# Patient Record
Sex: Male | Born: 1940 | Race: White | Hispanic: No | Marital: Married | Smoking: Former smoker
Health system: Southern US, Community
[De-identification: ages and names within clinical notes are randomized; demographics above are authoritative.]

## PROBLEM LIST (undated history)

## (undated) DIAGNOSIS — G4733 Obstructive sleep apnea (adult) (pediatric): Secondary | ICD-10-CM

## (undated) DIAGNOSIS — L570 Actinic keratosis: Secondary | ICD-10-CM

## (undated) DIAGNOSIS — I447 Left bundle-branch block, unspecified: Secondary | ICD-10-CM

## (undated) DIAGNOSIS — M720 Palmar fascial fibromatosis [Dupuytren]: Secondary | ICD-10-CM

## (undated) DIAGNOSIS — T7840XA Allergy, unspecified, initial encounter: Secondary | ICD-10-CM

## (undated) DIAGNOSIS — N183 Chronic kidney disease, stage 3 unspecified: Secondary | ICD-10-CM

## (undated) DIAGNOSIS — R972 Elevated prostate specific antigen [PSA]: Secondary | ICD-10-CM

## (undated) DIAGNOSIS — E119 Type 2 diabetes mellitus without complications: Secondary | ICD-10-CM

## (undated) DIAGNOSIS — Z8719 Personal history of other diseases of the digestive system: Secondary | ICD-10-CM

## (undated) DIAGNOSIS — R011 Cardiac murmur, unspecified: Secondary | ICD-10-CM

## (undated) DIAGNOSIS — R0902 Hypoxemia: Secondary | ICD-10-CM

## (undated) DIAGNOSIS — I272 Pulmonary hypertension, unspecified: Secondary | ICD-10-CM

## (undated) DIAGNOSIS — M1990 Unspecified osteoarthritis, unspecified site: Secondary | ICD-10-CM

## (undated) DIAGNOSIS — E785 Hyperlipidemia, unspecified: Secondary | ICD-10-CM

## (undated) DIAGNOSIS — I509 Heart failure, unspecified: Secondary | ICD-10-CM

## (undated) DIAGNOSIS — R7303 Prediabetes: Secondary | ICD-10-CM

## (undated) DIAGNOSIS — S61412A Laceration without foreign body of left hand, initial encounter: Secondary | ICD-10-CM

## (undated) DIAGNOSIS — G4730 Sleep apnea, unspecified: Secondary | ICD-10-CM

## (undated) DIAGNOSIS — F419 Anxiety disorder, unspecified: Secondary | ICD-10-CM

## (undated) DIAGNOSIS — I639 Cerebral infarction, unspecified: Secondary | ICD-10-CM

## (undated) DIAGNOSIS — I10 Essential (primary) hypertension: Secondary | ICD-10-CM

## (undated) DIAGNOSIS — J189 Pneumonia, unspecified organism: Secondary | ICD-10-CM

## (undated) DIAGNOSIS — K5790 Diverticulosis of intestine, part unspecified, without perforation or abscess without bleeding: Secondary | ICD-10-CM

## (undated) DIAGNOSIS — I209 Angina pectoris, unspecified: Secondary | ICD-10-CM

## (undated) DIAGNOSIS — I5030 Unspecified diastolic (congestive) heart failure: Secondary | ICD-10-CM

## (undated) DIAGNOSIS — J45909 Unspecified asthma, uncomplicated: Secondary | ICD-10-CM

## (undated) DIAGNOSIS — I714 Abdominal aortic aneurysm, without rupture, unspecified: Secondary | ICD-10-CM

## (undated) DIAGNOSIS — R519 Headache, unspecified: Secondary | ICD-10-CM

## (undated) DIAGNOSIS — K4090 Unilateral inguinal hernia, without obstruction or gangrene, not specified as recurrent: Secondary | ICD-10-CM

## (undated) DIAGNOSIS — I5020 Unspecified systolic (congestive) heart failure: Secondary | ICD-10-CM

## (undated) DIAGNOSIS — U071 COVID-19: Secondary | ICD-10-CM

## (undated) DIAGNOSIS — I77811 Abdominal aortic ectasia: Secondary | ICD-10-CM

## (undated) DIAGNOSIS — T884XXA Failed or difficult intubation, initial encounter: Secondary | ICD-10-CM

## (undated) DIAGNOSIS — N411 Chronic prostatitis: Secondary | ICD-10-CM

## (undated) DIAGNOSIS — H3412 Central retinal artery occlusion, left eye: Secondary | ICD-10-CM

## (undated) DIAGNOSIS — M5030 Other cervical disc degeneration, unspecified cervical region: Secondary | ICD-10-CM

## (undated) DIAGNOSIS — K219 Gastro-esophageal reflux disease without esophagitis: Secondary | ICD-10-CM

## (undated) DIAGNOSIS — R51 Headache: Secondary | ICD-10-CM

## (undated) DIAGNOSIS — M5116 Intervertebral disc disorders with radiculopathy, lumbar region: Secondary | ICD-10-CM

## (undated) DIAGNOSIS — I25810 Atherosclerosis of coronary artery bypass graft(s) without angina pectoris: Secondary | ICD-10-CM

## (undated) DIAGNOSIS — Z7982 Long term (current) use of aspirin: Secondary | ICD-10-CM

## (undated) DIAGNOSIS — R002 Palpitations: Secondary | ICD-10-CM

## (undated) DIAGNOSIS — G43909 Migraine, unspecified, not intractable, without status migrainosus: Secondary | ICD-10-CM

## (undated) DIAGNOSIS — Z8619 Personal history of other infectious and parasitic diseases: Secondary | ICD-10-CM

## (undated) DIAGNOSIS — G5793 Unspecified mononeuropathy of bilateral lower limbs: Secondary | ICD-10-CM

## (undated) DIAGNOSIS — I779 Disorder of arteries and arterioles, unspecified: Secondary | ICD-10-CM

## (undated) DIAGNOSIS — J439 Emphysema, unspecified: Secondary | ICD-10-CM

## (undated) DIAGNOSIS — J181 Lobar pneumonia, unspecified organism: Secondary | ICD-10-CM

## (undated) DIAGNOSIS — M48061 Spinal stenosis, lumbar region without neurogenic claudication: Secondary | ICD-10-CM

## (undated) DIAGNOSIS — I219 Acute myocardial infarction, unspecified: Secondary | ICD-10-CM

## (undated) DIAGNOSIS — I739 Peripheral vascular disease, unspecified: Secondary | ICD-10-CM

## (undated) DIAGNOSIS — Z86718 Personal history of other venous thrombosis and embolism: Secondary | ICD-10-CM

## (undated) DIAGNOSIS — I350 Nonrheumatic aortic (valve) stenosis: Secondary | ICD-10-CM

## (undated) DIAGNOSIS — N401 Enlarged prostate with lower urinary tract symptoms: Secondary | ICD-10-CM

## (undated) HISTORY — PX: APPENDECTOMY: SHX54

## (undated) HISTORY — DX: Unilateral inguinal hernia, without obstruction or gangrene, not specified as recurrent: K40.90

## (undated) HISTORY — DX: Actinic keratosis: L57.0

## (undated) HISTORY — DX: Disorder of arteries and arterioles, unspecified: I77.9

## (undated) HISTORY — PX: TOTAL HIP ARTHROPLASTY: SHX124

## (undated) HISTORY — DX: Personal history of other infectious and parasitic diseases: Z86.19

## (undated) HISTORY — DX: Sleep apnea, unspecified: G47.30

## (undated) HISTORY — DX: Hypoxemia: R09.02

## (undated) HISTORY — DX: Emphysema, unspecified: J43.9

## (undated) HISTORY — DX: Spinal stenosis, lumbar region without neurogenic claudication: M48.061

## (undated) HISTORY — DX: Laceration without foreign body of left hand, initial encounter: S61.412A

## (undated) HISTORY — DX: Unspecified osteoarthritis, unspecified site: M19.90

## (undated) HISTORY — DX: Chronic kidney disease, stage 3 unspecified: N18.30

## (undated) HISTORY — DX: Pneumonia, unspecified organism: J18.9

## (undated) HISTORY — DX: Intervertebral disc disorders with radiculopathy, lumbar region: M51.16

## (undated) HISTORY — DX: Peripheral vascular disease, unspecified: I73.9

## (undated) HISTORY — DX: Unspecified asthma, uncomplicated: J45.909

## (undated) HISTORY — DX: Personal history of other venous thrombosis and embolism: Z86.718

## (undated) HISTORY — PX: JOINT REPLACEMENT: SHX530

## (undated) HISTORY — DX: Obstructive sleep apnea (adult) (pediatric): G47.33

## (undated) HISTORY — DX: Chronic prostatitis: N41.1

## (undated) HISTORY — DX: Benign prostatic hyperplasia with lower urinary tract symptoms: N40.1

## (undated) HISTORY — DX: Lobar pneumonia, unspecified organism: J18.1

## (undated) HISTORY — DX: Hyperlipidemia, unspecified: E78.5

## (undated) HISTORY — DX: Allergy, unspecified, initial encounter: T78.40XA

## (undated) HISTORY — PX: EYE SURGERY: SHX253

## (undated) HISTORY — DX: Elevated prostate specific antigen (PSA): R97.20

## (undated) HISTORY — DX: Gastro-esophageal reflux disease without esophagitis: K21.9

## (undated) HISTORY — PX: BACK SURGERY: SHX140

## (undated) HISTORY — DX: Atherosclerosis of coronary artery bypass graft(s) without angina pectoris: I25.810

## (undated) HISTORY — PX: KNEE ARTHROSCOPY: SUR90

## (undated) HISTORY — DX: Essential (primary) hypertension: I10

## (undated) HISTORY — DX: Palmar fascial fibromatosis (dupuytren): M72.0

## (undated) HISTORY — DX: Unspecified diastolic (congestive) heart failure: I50.30

---
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
## 1984-08-15 HISTORY — PX: LAMINOTOMY: SHX998

## 1987-08-16 DIAGNOSIS — I219 Acute myocardial infarction, unspecified: Secondary | ICD-10-CM

## 1987-08-16 HISTORY — DX: Acute myocardial infarction, unspecified: I21.9

## 1988-08-15 DIAGNOSIS — Z951 Presence of aortocoronary bypass graft: Secondary | ICD-10-CM

## 1988-08-15 HISTORY — PX: CORONARY ARTERY BYPASS GRAFT: SHX141

## 1988-08-15 HISTORY — DX: Presence of aortocoronary bypass graft: Z95.1

## 1996-08-15 DIAGNOSIS — I82409 Acute embolism and thrombosis of unspecified deep veins of unspecified lower extremity: Secondary | ICD-10-CM

## 1996-08-15 DIAGNOSIS — Z86718 Personal history of other venous thrombosis and embolism: Secondary | ICD-10-CM

## 1996-08-15 HISTORY — DX: Personal history of other venous thrombosis and embolism: Z86.718

## 1996-08-15 HISTORY — DX: Acute embolism and thrombosis of unspecified deep veins of unspecified lower extremity: I82.409

## 1998-06-25 ENCOUNTER — Encounter: Payer: Self-pay | Admitting: *Deleted

## 1998-06-30 ENCOUNTER — Encounter: Payer: Self-pay | Admitting: *Deleted

## 1998-06-30 ENCOUNTER — Inpatient Hospital Stay (HOSPITAL_COMMUNITY): Admission: RE | Admit: 1998-06-30 | Discharge: 1998-07-05 | Payer: Self-pay | Admitting: *Deleted

## 1998-07-08 ENCOUNTER — Encounter (HOSPITAL_COMMUNITY): Admission: RE | Admit: 1998-07-08 | Discharge: 1998-10-06 | Payer: Self-pay | Admitting: *Deleted

## 2000-04-10 ENCOUNTER — Inpatient Hospital Stay (HOSPITAL_COMMUNITY): Admission: EM | Admit: 2000-04-10 | Discharge: 2000-04-13 | Payer: Self-pay | Admitting: Emergency Medicine

## 2000-04-10 ENCOUNTER — Encounter: Payer: Self-pay | Admitting: General Surgery

## 2000-04-10 ENCOUNTER — Encounter (INDEPENDENT_AMBULATORY_CARE_PROVIDER_SITE_OTHER): Payer: Self-pay | Admitting: Specialist

## 2000-07-27 ENCOUNTER — Ambulatory Visit (HOSPITAL_COMMUNITY): Admission: RE | Admit: 2000-07-27 | Discharge: 2000-07-27 | Payer: Self-pay | Admitting: Cardiology

## 2000-08-09 ENCOUNTER — Encounter: Payer: Self-pay | Admitting: Orthopedic Surgery

## 2000-08-14 ENCOUNTER — Inpatient Hospital Stay (HOSPITAL_COMMUNITY): Admission: RE | Admit: 2000-08-14 | Discharge: 2000-08-19 | Payer: Self-pay | Admitting: Orthopedic Surgery

## 2000-08-14 ENCOUNTER — Encounter: Payer: Self-pay | Admitting: Orthopedic Surgery

## 2000-11-29 ENCOUNTER — Inpatient Hospital Stay (HOSPITAL_COMMUNITY): Admission: EM | Admit: 2000-11-29 | Discharge: 2000-12-01 | Payer: Self-pay | Admitting: Emergency Medicine

## 2000-11-29 ENCOUNTER — Encounter: Payer: Self-pay | Admitting: Emergency Medicine

## 2000-11-30 HISTORY — PX: CORONARY ANGIOPLASTY: SHX604

## 2001-05-04 ENCOUNTER — Encounter: Admission: RE | Admit: 2001-05-04 | Discharge: 2001-05-04 | Payer: Self-pay | Admitting: Internal Medicine

## 2001-05-04 ENCOUNTER — Encounter: Payer: Self-pay | Admitting: Internal Medicine

## 2001-05-23 ENCOUNTER — Ambulatory Visit (HOSPITAL_COMMUNITY): Admission: RE | Admit: 2001-05-23 | Discharge: 2001-05-23 | Payer: Self-pay | Admitting: Internal Medicine

## 2001-05-23 ENCOUNTER — Encounter: Payer: Self-pay | Admitting: Internal Medicine

## 2002-08-19 ENCOUNTER — Encounter: Admission: RE | Admit: 2002-08-19 | Discharge: 2002-08-19 | Payer: Self-pay | Admitting: Internal Medicine

## 2002-08-19 ENCOUNTER — Encounter: Payer: Self-pay | Admitting: Internal Medicine

## 2002-09-04 ENCOUNTER — Ambulatory Visit (HOSPITAL_COMMUNITY): Admission: RE | Admit: 2002-09-04 | Discharge: 2002-09-04 | Payer: Self-pay | Admitting: Orthopedic Surgery

## 2002-09-04 ENCOUNTER — Encounter: Payer: Self-pay | Admitting: Orthopedic Surgery

## 2003-03-23 ENCOUNTER — Encounter: Payer: Self-pay | Admitting: *Deleted

## 2003-03-23 ENCOUNTER — Emergency Department (HOSPITAL_COMMUNITY): Admission: AD | Admit: 2003-03-23 | Discharge: 2003-03-23 | Payer: Self-pay | Admitting: Emergency Medicine

## 2004-02-07 ENCOUNTER — Emergency Department (HOSPITAL_COMMUNITY): Admission: EM | Admit: 2004-02-07 | Discharge: 2004-02-07 | Payer: Self-pay | Admitting: Emergency Medicine

## 2004-06-21 ENCOUNTER — Ambulatory Visit: Payer: Self-pay | Admitting: Internal Medicine

## 2004-06-29 ENCOUNTER — Ambulatory Visit: Payer: Self-pay | Admitting: Internal Medicine

## 2004-07-12 ENCOUNTER — Ambulatory Visit: Payer: Self-pay | Admitting: Internal Medicine

## 2004-07-27 ENCOUNTER — Ambulatory Visit: Payer: Self-pay | Admitting: Cardiology

## 2004-08-03 ENCOUNTER — Ambulatory Visit: Payer: Self-pay | Admitting: Cardiology

## 2004-08-17 ENCOUNTER — Ambulatory Visit: Payer: Self-pay | Admitting: Internal Medicine

## 2004-09-29 ENCOUNTER — Ambulatory Visit: Payer: Self-pay | Admitting: Internal Medicine

## 2004-10-22 ENCOUNTER — Ambulatory Visit: Payer: Self-pay | Admitting: Internal Medicine

## 2004-10-30 ENCOUNTER — Ambulatory Visit: Payer: Self-pay | Admitting: Family Medicine

## 2004-11-02 ENCOUNTER — Encounter: Admission: RE | Admit: 2004-11-02 | Discharge: 2004-11-02 | Payer: Self-pay | Admitting: Internal Medicine

## 2004-12-28 ENCOUNTER — Ambulatory Visit: Payer: Self-pay | Admitting: Internal Medicine

## 2005-04-13 ENCOUNTER — Ambulatory Visit: Payer: Self-pay | Admitting: Internal Medicine

## 2005-04-20 ENCOUNTER — Ambulatory Visit: Payer: Self-pay | Admitting: Internal Medicine

## 2005-05-25 ENCOUNTER — Encounter: Admission: RE | Admit: 2005-05-25 | Discharge: 2005-05-25 | Payer: Self-pay | Admitting: Orthopedic Surgery

## 2005-07-26 ENCOUNTER — Ambulatory Visit: Payer: Self-pay | Admitting: Internal Medicine

## 2005-08-12 ENCOUNTER — Ambulatory Visit: Payer: Self-pay | Admitting: Cardiology

## 2005-08-18 ENCOUNTER — Ambulatory Visit: Payer: Self-pay | Admitting: Cardiology

## 2005-09-17 ENCOUNTER — Ambulatory Visit: Payer: Self-pay | Admitting: Internal Medicine

## 2005-09-29 ENCOUNTER — Ambulatory Visit: Payer: Self-pay | Admitting: Internal Medicine

## 2005-12-07 ENCOUNTER — Ambulatory Visit: Payer: Self-pay | Admitting: Internal Medicine

## 2005-12-12 ENCOUNTER — Encounter: Admission: RE | Admit: 2005-12-12 | Discharge: 2005-12-12 | Payer: Self-pay | Admitting: General Surgery

## 2006-01-25 ENCOUNTER — Ambulatory Visit: Payer: Self-pay | Admitting: Internal Medicine

## 2006-04-19 ENCOUNTER — Ambulatory Visit: Payer: Self-pay | Admitting: Internal Medicine

## 2006-04-26 ENCOUNTER — Ambulatory Visit: Payer: Self-pay | Admitting: Internal Medicine

## 2006-06-29 ENCOUNTER — Ambulatory Visit: Payer: Self-pay | Admitting: Internal Medicine

## 2006-07-07 ENCOUNTER — Ambulatory Visit (HOSPITAL_COMMUNITY): Admission: RE | Admit: 2006-07-07 | Discharge: 2006-07-07 | Payer: Self-pay | Admitting: Orthopedic Surgery

## 2006-07-21 ENCOUNTER — Ambulatory Visit: Payer: Self-pay | Admitting: Cardiology

## 2006-09-19 ENCOUNTER — Ambulatory Visit: Payer: Self-pay | Admitting: Internal Medicine

## 2006-09-19 LAB — CONVERTED CEMR LAB
ALT: 28 units/L (ref 0–40)
AST: 28 units/L (ref 0–37)
Albumin: 4.1 g/dL (ref 3.5–5.2)
Alkaline Phosphatase: 40 units/L (ref 39–117)
BUN: 11 mg/dL (ref 6–23)
Basophils Absolute: 0 10*3/uL (ref 0.0–0.1)
Basophils Relative: 1 % (ref 0.0–1.0)
Bilirubin, Direct: 0.2 mg/dL (ref 0.0–0.3)
CO2: 25 meq/L (ref 19–32)
Calcium: 9.4 mg/dL (ref 8.4–10.5)
Chloride: 106 meq/L (ref 96–112)
Cholesterol: 132 mg/dL (ref 0–200)
Creatinine, Ser: 1.1 mg/dL (ref 0.4–1.5)
Direct LDL: 77.6 mg/dL
Eosinophils Absolute: 0.2 10*3/uL (ref 0.0–0.6)
Eosinophils Relative: 4.3 % (ref 0.0–5.0)
GFR calc Af Amer: 86 mL/min
GFR calc non Af Amer: 71 mL/min
Glucose, Bld: 110 mg/dL — ABNORMAL HIGH (ref 70–99)
HCT: 43.4 % (ref 39.0–52.0)
HDL: 28.3 mg/dL — ABNORMAL LOW (ref >39.0)
Hemoglobin: 15.5 g/dL (ref 13.0–17.0)
Hgb A1c MFr Bld: 6 % (ref 4.6–6.0)
Lymphocytes Relative: 27.9 % (ref 12.0–46.0)
MCHC: 35.6 g/dL (ref 30.0–36.0)
MCV: 95.3 fL (ref 78.0–100.0)
Monocytes Absolute: 0.5 10*3/uL (ref 0.2–0.7)
Monocytes Relative: 11.2 % — ABNORMAL HIGH (ref 3.0–11.0)
Neutro Abs: 2.7 10*3/uL (ref 1.4–7.7)
Neutrophils Relative %: 55.6 % (ref 43.0–77.0)
PSA: 0.76 ng/mL (ref 0.10–4.00)
Platelets: 207 10*3/uL (ref 150–400)
Potassium: 4 meq/L (ref 3.5–5.1)
RBC: 4.56 M/uL (ref 4.22–5.81)
RDW: 11.3 % — ABNORMAL LOW (ref 11.5–14.6)
Sodium: 140 meq/L (ref 135–145)
TSH: 3.46 microintl units/mL (ref 0.35–5.50)
Total Bilirubin: 0.7 mg/dL (ref 0.3–1.2)
Total CHOL/HDL Ratio: 4.7
Total Protein: 6.6 g/dL (ref 6.0–8.3)
Triglycerides: 208 mg/dL (ref 0–149)
VLDL: 42 mg/dL — ABNORMAL HIGH (ref 0–40)
WBC: 4.7 10*3/uL (ref 4.5–10.5)

## 2006-10-04 ENCOUNTER — Ambulatory Visit: Payer: Self-pay | Admitting: Internal Medicine

## 2006-10-10 ENCOUNTER — Ambulatory Visit: Payer: Self-pay

## 2006-12-06 ENCOUNTER — Ambulatory Visit: Payer: Self-pay | Admitting: Internal Medicine

## 2007-03-02 ENCOUNTER — Ambulatory Visit: Payer: Self-pay | Admitting: Internal Medicine

## 2007-04-26 DIAGNOSIS — Z86718 Personal history of other venous thrombosis and embolism: Secondary | ICD-10-CM | POA: Insufficient documentation

## 2007-04-26 DIAGNOSIS — M179 Osteoarthritis of knee, unspecified: Secondary | ICD-10-CM | POA: Insufficient documentation

## 2007-04-26 DIAGNOSIS — M1710 Unilateral primary osteoarthritis, unspecified knee: Secondary | ICD-10-CM | POA: Insufficient documentation

## 2007-04-26 DIAGNOSIS — E1169 Type 2 diabetes mellitus with other specified complication: Secondary | ICD-10-CM | POA: Insufficient documentation

## 2007-04-26 DIAGNOSIS — E785 Hyperlipidemia, unspecified: Secondary | ICD-10-CM

## 2007-04-26 HISTORY — DX: Hyperlipidemia, unspecified: E78.5

## 2007-04-30 ENCOUNTER — Ambulatory Visit: Payer: Self-pay | Admitting: Internal Medicine

## 2007-04-30 DIAGNOSIS — I10 Essential (primary) hypertension: Secondary | ICD-10-CM | POA: Insufficient documentation

## 2007-04-30 DIAGNOSIS — K219 Gastro-esophageal reflux disease without esophagitis: Secondary | ICD-10-CM | POA: Insufficient documentation

## 2007-04-30 HISTORY — DX: Essential (primary) hypertension: I10

## 2007-04-30 HISTORY — DX: Gastro-esophageal reflux disease without esophagitis: K21.9

## 2007-04-30 LAB — CONVERTED CEMR LAB
ALT: 27 units/L (ref 0–53)
AST: 21 units/L (ref 0–37)
Albumin: 4 g/dL (ref 3.5–5.2)
Alkaline Phosphatase: 44 units/L (ref 39–117)
Bilirubin, Direct: 0.2 mg/dL (ref 0.0–0.3)
Cholesterol, target level: 200 mg/dL
Cholesterol: 129 mg/dL (ref 0–200)
HDL goal, serum: 40 mg/dL
HDL: 26.3 mg/dL — ABNORMAL LOW (ref >39.0)
LDL Cholesterol: 72 mg/dL (ref 0–99)
LDL Goal: 100 mg/dL
Total Bilirubin: 0.9 mg/dL (ref 0.3–1.2)
Total CHOL/HDL Ratio: 4.9
Total Protein: 6.6 g/dL (ref 6.0–8.3)
Triglycerides: 155 mg/dL — ABNORMAL HIGH (ref 0–149)
VLDL: 31 mg/dL (ref 0–40)

## 2007-07-04 ENCOUNTER — Ambulatory Visit: Payer: Self-pay | Admitting: Internal Medicine

## 2007-07-25 ENCOUNTER — Ambulatory Visit: Payer: Self-pay | Admitting: Cardiology

## 2007-08-01 ENCOUNTER — Ambulatory Visit: Payer: Self-pay | Admitting: Cardiology

## 2007-08-01 ENCOUNTER — Ambulatory Visit: Payer: Self-pay

## 2007-08-01 LAB — CONVERTED CEMR LAB
BUN: 13 mg/dL (ref 6–23)
Basophils Absolute: 0.1 10*3/uL (ref 0.0–0.1)
Basophils Relative: 0.9 % (ref 0.0–1.0)
CO2: 29 meq/L (ref 19–32)
Calcium: 9.3 mg/dL (ref 8.4–10.5)
Chloride: 105 meq/L (ref 96–112)
Creatinine, Ser: 1 mg/dL (ref 0.4–1.5)
Eosinophils Absolute: 0.2 10*3/uL (ref 0.0–0.6)
Eosinophils Relative: 3.7 % (ref 0.0–5.0)
GFR calc Af Amer: 96 mL/min
GFR calc non Af Amer: 79 mL/min
Glucose, Bld: 127 mg/dL — ABNORMAL HIGH (ref 70–99)
HCT: 43.3 % (ref 39.0–52.0)
Hemoglobin: 15.3 g/dL (ref 13.0–17.0)
INR: 0.9 (ref 0.8–1.0)
Lymphocytes Relative: 24.2 % (ref 12.0–46.0)
MCHC: 35.3 g/dL (ref 30.0–36.0)
MCV: 95.4 fL (ref 78.0–100.0)
Monocytes Absolute: 0.6 10*3/uL (ref 0.2–0.7)
Monocytes Relative: 10 % (ref 3.0–11.0)
Neutro Abs: 3.5 10*3/uL (ref 1.4–7.7)
Neutrophils Relative %: 61.2 % (ref 43.0–77.0)
Platelets: 206 10*3/uL (ref 150–400)
Potassium: 3.8 meq/L (ref 3.5–5.1)
Prothrombin Time: 11.7 s (ref 10.9–13.3)
RBC: 4.54 M/uL (ref 4.22–5.81)
RDW: 11.7 % (ref 11.5–14.6)
Sodium: 140 meq/L (ref 135–145)
WBC: 5.8 10*3/uL (ref 4.5–10.5)
aPTT: 29.1 s (ref 21.7–29.8)

## 2007-08-02 ENCOUNTER — Inpatient Hospital Stay (HOSPITAL_BASED_OUTPATIENT_CLINIC_OR_DEPARTMENT_OTHER): Admission: RE | Admit: 2007-08-02 | Discharge: 2007-08-02 | Payer: Self-pay | Admitting: Cardiology

## 2007-08-02 ENCOUNTER — Ambulatory Visit: Payer: Self-pay | Admitting: Cardiology

## 2007-08-16 HISTORY — PX: CATARACT EXTRACTION, BILATERAL: SHX1313

## 2007-08-17 ENCOUNTER — Ambulatory Visit: Payer: Self-pay

## 2007-08-27 ENCOUNTER — Ambulatory Visit: Payer: Self-pay | Admitting: Cardiology

## 2007-09-06 ENCOUNTER — Ambulatory Visit: Payer: Self-pay | Admitting: Internal Medicine

## 2007-09-06 DIAGNOSIS — N138 Other obstructive and reflux uropathy: Secondary | ICD-10-CM | POA: Insufficient documentation

## 2007-09-06 DIAGNOSIS — N401 Enlarged prostate with lower urinary tract symptoms: Secondary | ICD-10-CM

## 2007-09-06 DIAGNOSIS — N400 Enlarged prostate without lower urinary tract symptoms: Secondary | ICD-10-CM

## 2007-09-06 HISTORY — DX: Other obstructive and reflux uropathy: N13.8

## 2007-09-06 HISTORY — DX: Benign prostatic hyperplasia without lower urinary tract symptoms: N40.0

## 2007-11-06 ENCOUNTER — Ambulatory Visit: Payer: Self-pay | Admitting: Ophthalmology

## 2007-11-08 ENCOUNTER — Ambulatory Visit: Payer: Self-pay | Admitting: Internal Medicine

## 2007-12-04 ENCOUNTER — Telehealth: Payer: Self-pay | Admitting: Internal Medicine

## 2007-12-18 ENCOUNTER — Ambulatory Visit: Payer: Self-pay | Admitting: Ophthalmology

## 2008-01-23 ENCOUNTER — Ambulatory Visit (HOSPITAL_BASED_OUTPATIENT_CLINIC_OR_DEPARTMENT_OTHER): Admission: RE | Admit: 2008-01-23 | Discharge: 2008-01-23 | Payer: Self-pay | Admitting: Orthopedic Surgery

## 2008-02-01 ENCOUNTER — Ambulatory Visit: Payer: Self-pay | Admitting: Internal Medicine

## 2008-02-01 LAB — CONVERTED CEMR LAB
ALT: 32 units/L (ref 0–53)
AST: 31 units/L (ref 0–37)
Albumin: 4 g/dL (ref 3.5–5.2)
Alkaline Phosphatase: 53 units/L (ref 39–117)
Bilirubin, Direct: 0.1 mg/dL (ref 0.0–0.3)
Cholesterol: 121 mg/dL (ref 0–200)
HDL: 24.2 mg/dL — ABNORMAL LOW (ref >39.0)
LDL Cholesterol: 61 mg/dL (ref 0–99)
Total Bilirubin: 0.8 mg/dL (ref 0.3–1.2)
Total CHOL/HDL Ratio: 5
Total Protein: 6.8 g/dL (ref 6.0–8.3)
Triglycerides: 180 mg/dL — ABNORMAL HIGH (ref 0–149)
VLDL: 36 mg/dL (ref 0–40)

## 2008-02-08 ENCOUNTER — Ambulatory Visit: Payer: Self-pay | Admitting: Internal Medicine

## 2008-04-29 ENCOUNTER — Ambulatory Visit: Payer: Self-pay | Admitting: Internal Medicine

## 2008-04-29 LAB — CONVERTED CEMR LAB
ALT: 26 units/L (ref 0–53)
AST: 26 units/L (ref 0–37)
Albumin: 4.1 g/dL (ref 3.5–5.2)
Alkaline Phosphatase: 42 units/L (ref 39–117)
Bilirubin, Direct: 0.2 mg/dL (ref 0.0–0.3)
Cholesterol: 147 mg/dL (ref 0–200)
HDL: 26.1 mg/dL — ABNORMAL LOW (ref >39.0)
LDL Cholesterol: 91 mg/dL (ref 0–99)
Total Bilirubin: 1 mg/dL (ref 0.3–1.2)
Total CHOL/HDL Ratio: 5.6
Total Protein: 6.6 g/dL (ref 6.0–8.3)
Triglycerides: 150 mg/dL — ABNORMAL HIGH (ref 0–149)
VLDL: 30 mg/dL (ref 0–40)

## 2008-05-09 ENCOUNTER — Ambulatory Visit: Payer: Self-pay | Admitting: Internal Medicine

## 2008-05-09 DIAGNOSIS — R109 Unspecified abdominal pain: Secondary | ICD-10-CM | POA: Insufficient documentation

## 2008-05-09 DIAGNOSIS — N411 Chronic prostatitis: Secondary | ICD-10-CM | POA: Insufficient documentation

## 2008-05-09 HISTORY — DX: Chronic prostatitis: N41.1

## 2008-05-12 ENCOUNTER — Encounter: Admission: RE | Admit: 2008-05-12 | Discharge: 2008-05-12 | Payer: Self-pay | Admitting: Internal Medicine

## 2008-07-02 ENCOUNTER — Ambulatory Visit: Payer: Self-pay | Admitting: Internal Medicine

## 2008-07-02 LAB — CONVERTED CEMR LAB
ALT: 17 units/L (ref 0–53)
AST: 21 units/L (ref 0–37)
Albumin: 4 g/dL (ref 3.5–5.2)
Alkaline Phosphatase: 44 units/L (ref 39–117)
BUN: 12 mg/dL (ref 6–23)
Basophils Absolute: 0.1 10*3/uL (ref 0.0–0.1)
Basophils Relative: 1 % (ref 0.0–3.0)
Bilirubin Urine: NEGATIVE
Bilirubin, Direct: 0.1 mg/dL (ref 0.0–0.3)
Blood in Urine, dipstick: NEGATIVE
CO2: 26 meq/L (ref 19–32)
Calcium: 9.2 mg/dL (ref 8.4–10.5)
Chloride: 107 meq/L (ref 96–112)
Cholesterol: 122 mg/dL (ref 0–200)
Creatinine, Ser: 1 mg/dL (ref 0.4–1.5)
Eosinophils Absolute: 0.2 10*3/uL (ref 0.0–0.7)
Eosinophils Relative: 3 % (ref 0.0–5.0)
GFR calc Af Amer: 96 mL/min
GFR calc non Af Amer: 79 mL/min
Glucose, Bld: 106 mg/dL — ABNORMAL HIGH (ref 70–99)
Glucose, Urine, Semiquant: NEGATIVE
HCT: 44.3 % (ref 39.0–52.0)
HDL: 34.1 mg/dL — ABNORMAL LOW (ref >39.0)
Hemoglobin: 15.6 g/dL (ref 13.0–17.0)
Ketones, urine, test strip: NEGATIVE
LDL Cholesterol: 67 mg/dL (ref 0–99)
Lymphocytes Relative: 22.7 % (ref 12.0–46.0)
MCHC: 35.1 g/dL (ref 30.0–36.0)
MCV: 96.5 fL (ref 78.0–100.0)
Monocytes Absolute: 0.6 10*3/uL (ref 0.1–1.0)
Monocytes Relative: 9.4 % (ref 3.0–12.0)
Neutro Abs: 3.9 10*3/uL (ref 1.4–7.7)
Neutrophils Relative %: 63.9 % (ref 43.0–77.0)
Nitrite: NEGATIVE
PSA: 6.35 ng/mL — ABNORMAL HIGH (ref 0.10–4.00)
Platelets: 191 10*3/uL (ref 150–400)
Potassium: 3.9 meq/L (ref 3.5–5.1)
Protein, U semiquant: NEGATIVE
RBC: 4.59 M/uL (ref 4.22–5.81)
RDW: 11.4 % — ABNORMAL LOW (ref 11.5–14.6)
Sodium: 140 meq/L (ref 135–145)
Specific Gravity, Urine: 1.015
TSH: 2.49 microintl units/mL (ref 0.35–5.50)
Total Bilirubin: 1.1 mg/dL (ref 0.3–1.2)
Total CHOL/HDL Ratio: 3.6
Total Protein: 7 g/dL (ref 6.0–8.3)
Triglycerides: 107 mg/dL (ref 0–149)
Urobilinogen, UA: 0.2
VLDL: 21 mg/dL (ref 0–40)
WBC: 6.2 10*3/uL (ref 4.5–10.5)
pH: 5.5

## 2008-07-09 ENCOUNTER — Ambulatory Visit: Payer: Self-pay | Admitting: Internal Medicine

## 2008-07-09 DIAGNOSIS — I6529 Occlusion and stenosis of unspecified carotid artery: Secondary | ICD-10-CM | POA: Insufficient documentation

## 2008-07-09 DIAGNOSIS — R972 Elevated prostate specific antigen [PSA]: Secondary | ICD-10-CM | POA: Insufficient documentation

## 2008-07-09 HISTORY — DX: Elevated prostate specific antigen (PSA): R97.20

## 2008-07-16 ENCOUNTER — Ambulatory Visit: Payer: Self-pay | Admitting: Internal Medicine

## 2008-07-16 DIAGNOSIS — R071 Chest pain on breathing: Secondary | ICD-10-CM | POA: Insufficient documentation

## 2008-07-16 LAB — CONVERTED CEMR LAB
BUN: 18 mg/dL (ref 6–23)
Basophils Absolute: 0 10*3/uL (ref 0.0–0.1)
Basophils Relative: 0.3 % (ref 0.0–3.0)
CO2: 24 meq/L (ref 19–32)
Calcium: 8.3 mg/dL — ABNORMAL LOW (ref 8.4–10.5)
Chloride: 109 meq/L (ref 96–112)
Creatinine, Ser: 1.1 mg/dL (ref 0.4–1.5)
Eosinophils Absolute: 0.1 10*3/uL (ref 0.0–0.7)
Eosinophils Relative: 2.7 % (ref 0.0–5.0)
GFR calc Af Amer: 86 mL/min
GFR calc non Af Amer: 71 mL/min
Glucose, Bld: 97 mg/dL (ref 70–99)
HCT: 48.1 % (ref 39.0–52.0)
Hemoglobin: 16.6 g/dL (ref 13.0–17.0)
Lymphocytes Relative: 11.9 % — ABNORMAL LOW (ref 12.0–46.0)
MCHC: 34.5 g/dL (ref 30.0–36.0)
MCV: 96.9 fL (ref 78.0–100.0)
Monocytes Absolute: 0.5 10*3/uL (ref 0.1–1.0)
Monocytes Relative: 8.6 % (ref 3.0–12.0)
Neutro Abs: 4.1 10*3/uL (ref 1.4–7.7)
Neutrophils Relative %: 76.5 % (ref 43.0–77.0)
Platelets: 154 10*3/uL (ref 150–400)
Potassium: 3.7 meq/L (ref 3.5–5.1)
RBC: 4.96 M/uL (ref 4.22–5.81)
RDW: 11.5 % (ref 11.5–14.6)
Sodium: 141 meq/L (ref 135–145)
WBC: 5.3 10*3/uL (ref 4.5–10.5)

## 2008-07-18 ENCOUNTER — Telehealth: Payer: Self-pay | Admitting: Internal Medicine

## 2008-07-18 ENCOUNTER — Encounter: Payer: Self-pay | Admitting: Internal Medicine

## 2008-07-18 ENCOUNTER — Ambulatory Visit: Payer: Self-pay

## 2008-07-30 ENCOUNTER — Ambulatory Visit: Payer: Self-pay | Admitting: Internal Medicine

## 2008-08-04 ENCOUNTER — Telehealth: Payer: Self-pay | Admitting: Internal Medicine

## 2008-08-12 LAB — CONVERTED CEMR LAB: PSA: 3.03 ng/mL (ref 0.10–4.00)

## 2008-08-13 ENCOUNTER — Encounter: Payer: Self-pay | Admitting: Internal Medicine

## 2008-08-19 ENCOUNTER — Ambulatory Visit: Payer: Self-pay | Admitting: Cardiology

## 2008-08-22 ENCOUNTER — Encounter: Payer: Self-pay | Admitting: Cardiovascular Disease

## 2008-08-22 ENCOUNTER — Ambulatory Visit: Payer: Self-pay

## 2008-09-05 ENCOUNTER — Ambulatory Visit: Payer: Self-pay | Admitting: Cardiovascular Disease

## 2008-09-19 ENCOUNTER — Encounter: Payer: Self-pay | Admitting: Internal Medicine

## 2008-10-27 ENCOUNTER — Encounter: Payer: Self-pay | Admitting: Internal Medicine

## 2008-10-29 ENCOUNTER — Ambulatory Visit: Payer: Self-pay | Admitting: Internal Medicine

## 2008-10-29 DIAGNOSIS — M720 Palmar fascial fibromatosis [Dupuytren]: Secondary | ICD-10-CM | POA: Insufficient documentation

## 2008-10-29 HISTORY — DX: Palmar fascial fibromatosis (dupuytren): M72.0

## 2009-01-28 ENCOUNTER — Ambulatory Visit: Payer: Self-pay | Admitting: Internal Medicine

## 2009-01-28 LAB — CONVERTED CEMR LAB
ALT: 25 units/L (ref 0–53)
AST: 27 units/L (ref 0–37)
Albumin: 3.9 g/dL (ref 3.5–5.2)
Alkaline Phosphatase: 41 units/L (ref 39–117)
Bilirubin, Direct: 0.1 mg/dL (ref 0.0–0.3)
Cholesterol: 115 mg/dL (ref 0–200)
HDL: 32.2 mg/dL — ABNORMAL LOW (ref >39.00)
LDL Cholesterol: 65 mg/dL (ref 0–99)
PSA: 1.19 ng/mL (ref 0.10–4.00)
Total Bilirubin: 0.9 mg/dL (ref 0.3–1.2)
Total CHOL/HDL Ratio: 4
Total Protein: 6.6 g/dL (ref 6.0–8.3)
Triglycerides: 91 mg/dL (ref 0.0–149.0)
VLDL: 18.2 mg/dL (ref 0.0–40.0)

## 2009-02-04 ENCOUNTER — Ambulatory Visit: Payer: Self-pay | Admitting: Internal Medicine

## 2009-03-31 ENCOUNTER — Telehealth: Payer: Self-pay | Admitting: Internal Medicine

## 2009-04-22 ENCOUNTER — Encounter: Payer: Self-pay | Admitting: Internal Medicine

## 2009-06-08 ENCOUNTER — Telehealth: Payer: Self-pay | Admitting: Internal Medicine

## 2009-06-22 ENCOUNTER — Encounter (INDEPENDENT_AMBULATORY_CARE_PROVIDER_SITE_OTHER): Payer: Self-pay | Admitting: *Deleted

## 2009-07-28 ENCOUNTER — Ambulatory Visit: Payer: Self-pay | Admitting: Internal Medicine

## 2009-07-28 LAB — CONVERTED CEMR LAB
ALT: 23 units/L (ref 0–53)
AST: 24 units/L (ref 0–37)
Albumin: 3.8 g/dL (ref 3.5–5.2)
Alkaline Phosphatase: 34 units/L — ABNORMAL LOW (ref 39–117)
Bilirubin, Direct: 0.1 mg/dL (ref 0.0–0.3)
Cholesterol: 161 mg/dL (ref 0–200)
HDL: 36.7 mg/dL — ABNORMAL LOW (ref >39.00)
LDL Cholesterol: 103 mg/dL — ABNORMAL HIGH (ref 0–99)
Total Bilirubin: 1.1 mg/dL (ref 0.3–1.2)
Total CHOL/HDL Ratio: 4
Total Protein: 6.3 g/dL (ref 6.0–8.3)
Triglycerides: 107 mg/dL (ref 0.0–149.0)
VLDL: 21.4 mg/dL (ref 0.0–40.0)

## 2009-07-31 ENCOUNTER — Encounter (INDEPENDENT_AMBULATORY_CARE_PROVIDER_SITE_OTHER): Payer: Self-pay | Admitting: *Deleted

## 2009-08-04 ENCOUNTER — Ambulatory Visit: Payer: Self-pay | Admitting: Internal Medicine

## 2009-08-11 DIAGNOSIS — I25110 Atherosclerotic heart disease of native coronary artery with unstable angina pectoris: Secondary | ICD-10-CM | POA: Insufficient documentation

## 2009-08-11 DIAGNOSIS — I739 Peripheral vascular disease, unspecified: Secondary | ICD-10-CM | POA: Insufficient documentation

## 2009-08-11 DIAGNOSIS — I25810 Atherosclerosis of coronary artery bypass graft(s) without angina pectoris: Secondary | ICD-10-CM | POA: Insufficient documentation

## 2009-08-11 HISTORY — DX: Atherosclerosis of coronary artery bypass graft(s) without angina pectoris: I25.810

## 2009-08-16 ENCOUNTER — Encounter: Payer: Self-pay | Admitting: Cardiology

## 2009-08-17 ENCOUNTER — Ambulatory Visit: Payer: Self-pay

## 2009-08-17 ENCOUNTER — Encounter: Payer: Self-pay | Admitting: Cardiovascular Disease

## 2009-08-17 ENCOUNTER — Ambulatory Visit: Payer: Self-pay | Admitting: Cardiology

## 2009-10-28 ENCOUNTER — Ambulatory Visit: Payer: Self-pay | Admitting: Internal Medicine

## 2009-10-28 LAB — CONVERTED CEMR LAB
ALT: 20 units/L (ref 0–53)
AST: 20 units/L (ref 0–37)
Albumin: 3.7 g/dL (ref 3.5–5.2)
Alkaline Phosphatase: 37 units/L — ABNORMAL LOW (ref 39–117)
Bilirubin, Direct: 0 mg/dL (ref 0.0–0.3)
Cholesterol: 215 mg/dL — ABNORMAL HIGH (ref 0–200)
Direct LDL: 162 mg/dL
HDL: 39.4 mg/dL (ref >39.00)
Total Bilirubin: 0.5 mg/dL (ref 0.3–1.2)
Total CHOL/HDL Ratio: 5
Total Protein: 6.6 g/dL (ref 6.0–8.3)
Triglycerides: 178 mg/dL — ABNORMAL HIGH (ref 0.0–149.0)
VLDL: 35.6 mg/dL (ref 0.0–40.0)

## 2009-11-05 ENCOUNTER — Ambulatory Visit: Payer: Self-pay | Admitting: Internal Medicine

## 2009-11-05 DIAGNOSIS — M1710 Unilateral primary osteoarthritis, unspecified knee: Secondary | ICD-10-CM | POA: Insufficient documentation

## 2009-11-30 ENCOUNTER — Telehealth: Payer: Self-pay | Admitting: Internal Medicine

## 2010-01-07 ENCOUNTER — Encounter: Payer: Self-pay | Admitting: Internal Medicine

## 2010-01-28 ENCOUNTER — Ambulatory Visit: Payer: Self-pay | Admitting: Internal Medicine

## 2010-01-28 LAB — CONVERTED CEMR LAB
ALT: 19 units/L (ref 0–53)
AST: 22 units/L (ref 0–37)
Albumin: 4 g/dL (ref 3.5–5.2)
Alkaline Phosphatase: 42 units/L (ref 39–117)
Bilirubin, Direct: 0.2 mg/dL (ref 0.0–0.3)
Cholesterol: 150 mg/dL (ref 0–200)
HDL: 36.4 mg/dL — ABNORMAL LOW (ref >39.00)
LDL Cholesterol: 96 mg/dL (ref 0–99)
Total Bilirubin: 0.7 mg/dL (ref 0.3–1.2)
Total CHOL/HDL Ratio: 4
Total Protein: 6.1 g/dL (ref 6.0–8.3)
Triglycerides: 89 mg/dL (ref 0.0–149.0)
VLDL: 17.8 mg/dL (ref 0.0–40.0)

## 2010-02-08 ENCOUNTER — Ambulatory Visit: Payer: Self-pay | Admitting: Internal Medicine

## 2010-03-08 ENCOUNTER — Ambulatory Visit: Payer: Self-pay | Admitting: Family Medicine

## 2010-03-09 ENCOUNTER — Telehealth: Payer: Self-pay | Admitting: Family Medicine

## 2010-03-10 ENCOUNTER — Encounter: Payer: Self-pay | Admitting: Internal Medicine

## 2010-03-15 HISTORY — PX: COLONOSCOPY: SHX174

## 2010-03-18 ENCOUNTER — Inpatient Hospital Stay (HOSPITAL_COMMUNITY): Admission: EM | Admit: 2010-03-18 | Discharge: 2010-03-19 | Payer: Self-pay | Admitting: Emergency Medicine

## 2010-03-18 ENCOUNTER — Ambulatory Visit: Payer: Self-pay | Admitting: Cardiology

## 2010-03-23 ENCOUNTER — Encounter: Payer: Self-pay | Admitting: Cardiology

## 2010-03-23 ENCOUNTER — Encounter: Payer: Self-pay | Admitting: Internal Medicine

## 2010-03-23 LAB — HM COLONOSCOPY

## 2010-04-15 ENCOUNTER — Telehealth: Payer: Self-pay | Admitting: Cardiology

## 2010-04-20 ENCOUNTER — Encounter: Payer: Self-pay | Admitting: Cardiology

## 2010-04-20 ENCOUNTER — Ambulatory Visit: Payer: Self-pay

## 2010-04-20 ENCOUNTER — Encounter: Payer: Self-pay | Admitting: Physician Assistant

## 2010-04-20 DIAGNOSIS — R609 Edema, unspecified: Secondary | ICD-10-CM | POA: Insufficient documentation

## 2010-04-21 ENCOUNTER — Encounter: Payer: Self-pay | Admitting: Internal Medicine

## 2010-04-22 ENCOUNTER — Encounter: Payer: Self-pay | Admitting: Cardiology

## 2010-04-22 ENCOUNTER — Encounter: Payer: Self-pay | Admitting: Internal Medicine

## 2010-04-22 ENCOUNTER — Encounter: Admission: RE | Admit: 2010-04-22 | Discharge: 2010-04-22 | Payer: Self-pay | Admitting: Gastroenterology

## 2010-04-22 ENCOUNTER — Ambulatory Visit: Payer: Self-pay

## 2010-04-23 ENCOUNTER — Telehealth: Payer: Self-pay | Admitting: Cardiology

## 2010-05-04 ENCOUNTER — Telehealth: Payer: Self-pay | Admitting: Cardiology

## 2010-05-25 ENCOUNTER — Telehealth: Payer: Self-pay | Admitting: Internal Medicine

## 2010-05-26 ENCOUNTER — Ambulatory Visit: Payer: Self-pay | Admitting: Internal Medicine

## 2010-05-26 DIAGNOSIS — K4090 Unilateral inguinal hernia, without obstruction or gangrene, not specified as recurrent: Secondary | ICD-10-CM

## 2010-05-26 HISTORY — DX: Unilateral inguinal hernia, without obstruction or gangrene, not specified as recurrent: K40.90

## 2010-06-04 ENCOUNTER — Telehealth: Payer: Self-pay | Admitting: *Deleted

## 2010-06-07 ENCOUNTER — Ambulatory Visit: Payer: Self-pay | Admitting: Family Medicine

## 2010-06-07 DIAGNOSIS — H6590 Unspecified nonsuppurative otitis media, unspecified ear: Secondary | ICD-10-CM | POA: Insufficient documentation

## 2010-06-07 DIAGNOSIS — J019 Acute sinusitis, unspecified: Secondary | ICD-10-CM | POA: Insufficient documentation

## 2010-06-14 ENCOUNTER — Telehealth: Payer: Self-pay | Admitting: Internal Medicine

## 2010-07-05 ENCOUNTER — Encounter: Payer: Self-pay | Admitting: Internal Medicine

## 2010-07-26 ENCOUNTER — Ambulatory Visit: Payer: Self-pay | Admitting: Internal Medicine

## 2010-09-14 NOTE — Progress Notes (Signed)
Summary: lab results.  Phone Note Call from Patient   Summary of Call: [phone] Requesting lab results. Initial call taken by: Lynann Beaver CMA,  August 04, 2008 10:50 AM  Follow-up for Phone Call        congrats dropped to 3.0 !!!!! Follow-up by: Stacie Glaze MD,  August 04, 2008 11:01 AM  Additional Follow-up for Phone Call Additional follow up Details #1::        Pt. notified. Additional Follow-up by: Lynann Beaver CMA,  August 04, 2008 12:25 PM

## 2010-09-14 NOTE — Letter (Signed)
Summary: [facility]   Imported By: Maryln Gottron 07/15/2010 10:30:57  _____________________________________________________________________  External Attachment:    Type:   Image     Comment:   External Document

## 2010-09-14 NOTE — Assessment & Plan Note (Signed)
Summary: 3 wk rov/njr   Vital Signs:  Patient Profile:   70 Years Old Male Height:     72 inches Temp:     98.4 degrees F oral Pulse rate:   76 / minute Resp:     14 per minute BP sitting:   120 / 70  (left arm)  Vitals Entered By: Willy Eddy, LPN (July 30, 2008 12:28 PM)                 Chief Complaint:  roa after elevated psa at [facility]- completed cipro.  History of Present Illness: carotid dopler reported as improved frequency is improved post the antibiotics and still has an appintment with the urologist on 12/30  Hypertension History:      He denies headache, chest pain, palpitations, dyspnea with exertion, orthopnea, PND, peripheral edema, visual symptoms, neurologic problems, syncope, and side effects from treatment.        Positive major cardiovascular risk factors include male age 70 years old or older, hyperlipidemia, hypertension, and family history for ischemic heart disease (males less than 2 years old).  Negative major cardiovascular risk factors include no history of diabetes and non-tobacco-user status.        Positive history for target organ damage include ASHD (either angina; prior MI; prior CABG) and peripheral vascular disease.  Further assessment for target organ damage reveals no history of stroke/TIA.       Prior Medication List:  VYTORIN 10-40 MG  TABS (EZETIMIBE-SIMVASTATIN) once daily PRILOSEC 20 MG  CPDR (OMEPRAZOLE) 1in am as needed \\par  CIPROFLOXACIN HCL 500 MG TABS (CIPROFLOXACIN HCL) one by mouth two times a day  for 3 weeks AGGRENOX 25-200 MG XR12H-CAP (ASPIRIN-DIPYRIDAMOLE) one by mouth BID ULTRAM 50 MG TABS (TRAMADOL HCL) 1 every 4-6 hours as needed pain   Current Allergies (reviewed today): ! VIOXX ! MORPHINE  Past Medical History:    Reviewed history from 04/30/2007 and no changes required:       Coronary artery disease       Hyperlipidemia       Osteoarthritis       DVT, hx of       GERD       Hypertension  Past  Surgical History:    Reviewed history from 04/26/2007 and no changes required:       Coronary artery bypass graft       Total hip replacement   Family History:    Reviewed history from 09/06/2007 and no changes required:       Family History of CAD Male 1st degree relative <50       Family History High cholesterol       Family History Hypertension  Social History:    Reviewed history from 04/26/2007 and no changes required:       Retired       Married       Former Smoker       Alcohol use-no       Drug use-no   Risk Factors: Tobacco use:  quit Drug use:  no Alcohol use:  no  Family History Risk Factors:    Family History of MI in females < 39 years old:  no    Family History of MI in males < 45 years old:  yes   Review of Systems  The patient denies anorexia, fever, weight loss, weight gain, vision loss, decreased hearing, hoarseness, chest pain, syncope, dyspnea on exertion, peripheral edema, prolonged cough, headaches, hemoptysis,  abdominal pain, melena, hematochezia, severe indigestion/heartburn, hematuria, incontinence, genital sores, muscle weakness, suspicious skin lesions, transient blindness, difficulty walking, depression, unusual weight change, abnormal bleeding, enlarged lymph nodes, angioedema, breast masses, and testicular masses.     Physical Exam  General:     well-developed.  alert and well-nourished.   Head:     normocephalic and atraumatic.   Eyes:     pupils equal and pupils round.  pupils equal and pupils round.   Ears:     R ear normal and L ear normal.   Nose:     no nasal discharge.  no mucosal pallor.   Mouth:     pharynx pink and moist.  no erythema.   Neck:     No deformities, masses, or tenderness noted. Lungs:     normal respiratory effort and no wheezes.   chest wall pain  Heart:     2normal rate, regular rhythm, and Grade  2 /6 systolic ejection murmur.   Abdomen:     Bowel sounds positive,abdomen soft and non-tender without  masses, organomegaly or hernias noted. Prostate:     1+ enlarged.      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 One by mouth bid   Problem # 4:  HYPERTENSION (ICD-401.9) Assessment: Unchanged  BP today: 120/70 Prior BP: 124/80 (07/16/2008)  Prior 10 Yr Risk Heart Disease: [name] (04/30/2007)  Labs Reviewed: Creat: 1.1 (07/16/2008) Chol: 122 (07/02/2008)   HDL: 34.1 (07/02/2008)   LDL: 67 (07/02/2008)   TG: 107 (07/02/2008)   Complete Medication List: 1)  Vytorin 10-40 Mg Tabs (Ezetimibe-simvastatin) .... Once daily 2)  Prilosec 20 Mg Cpdr (Omeprazole) .Marland Kitchen.. 1in am as needed \\par  3)  Aggrenox 25-200 Mg Xr12h-cap (Aspirin-dipyridamole) .... One by mouth bid 4)  Ultram 50 Mg Tabs (Tramadol hcl) .Marland Kitchen.. 1 every 4-6 hours as needed pain  Hypertension Assessment/Plan:      The patient's hypertensive risk group is category C: Target organ damage and/or diabetes.  Today's blood pressure is 120/70.  His blood pressure goal is < 140/90.   Patient Instructions: 1)  Please schedule a follow-up appointment in 3 months.   ]

## 2010-09-14 NOTE — Progress Notes (Signed)
Summary: BURNING SENSATION  GROIN AREA  Phone Note Call from Patient Call back at Home Phone [phone]   Caller: Patient Call For: Stacie Glaze MD Summary of Call: BURNING SENSATION IN RIGHT SIDE GROIN AREA. PT WAS LIFTING SOMETHING HEAVY. PHARM [facility] Initial call taken by: Heron Sabins,  May 25, 2010 3:18 PM  Follow-up for Phone Call        ov given for tomorow Follow-up by: Willy Eddy, LPN,  May 25, 2010 3:50 PM

## 2010-09-14 NOTE — Assessment & Plan Note (Signed)
Summary: 1 YR F/U   Visit Type:  Follow-up Primary Provider:  Stacie Glaze MD  CC:  no complaints.  History of Present Illness: The patient is 70 years old return for management of CAD. He had bypass surgery in 1990 and later had stenting of the circumflex artery. His last catheterization was in December of 2008 and all grafts and stent was were patent.  He also has peripheral vascular disease with indices of 0.85 on both sides. He saw Dr. Excell Seltzer who recommended medical treatment.  He has been doing well with no symptoms of chest pain shortness of breath or palpitations. He does have leg pain but this occurs mostly at rest and he feels are related to statins. He is on a new statin by Dr. [name] on Mondays and Fridays to try and help with this.  His past history is significant for hyperlipidemia and history of deep vein thrombophlebitis.  Current Medications (verified): 1)  Adult Aspirin Low Strength 81 Mg Tbdp (Aspirin) .Marland Kitchen.. 1 Once Daily 2)  Rapaflo 4 Mg Caps (Silodosin) .Marland Kitchen.. 1 Once Daily 3)  Livalo 4 Mg Tabs (Pitavastatin Calcium) .... One By Mouth Monday and Friday Nights  Allergies (verified): 1)  ! Vioxx 2)  ! Morphine  Past History:  Past Medical History: Reviewed history from 08/11/2009 and no changes required. 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   Vital Signs:  Patient profile:   70 year old male Height:      72 inches Weight:      219 pounds BMI:     29.81 Pulse rate:   67 / minute BP sitting:   127 / 67  Vitals Entered By: Burnett Kanaris, CNA (August 17, 2009 3:27 PM)  Physical Exam  Additional Exam:  Gen. Well-nourished, in no distress   Neck: No JVD, thyroid not enlarged, no carotid bruits Lungs: No tachypnea, clear without rales, rhonchi or wheezes Cardiovascular: Rhythm regular, PMI not displaced,  heart sounds  normal, no murmurs or gallops, no peripheral edema, pulses normal in all 4 extremities. Abdomen: BS normal, abdomen soft and non-tender without masses or organomegaly, no hepatosplenomegaly. MS: No deformities, no cyanosis or clubbing   Neuro:  No focal sns   Skin:  no lesions    Impression & Recommendations:  Problem # 1:  CAD, ARTERY BYPASS GRAFT (ICD-414.04)  He had bypass surgery in 1990. He has had no recent chest pain. This problem is stable. His updated medication list for this problem includes:    Adult Aspirin Low Strength 81 Mg Tbdp (Aspirin) .Marland Kitchen... 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 Most of his pain is nonexertional it may be related to his statins but he does have some claudication as well. He had repeat ABIs today and they were 0.82 on  the right and 0.92 on the left. This problem appears stable and he wanted to hold off on his followup appointment with Dr. Excell Seltzer.  Problem # 3:  HYPERLIPIDEMIA (ICD-272.4) This is managed by Dr. [name]. He is currently on Livalo on Mondays and Fridays. His updated medication list for this problem includes:    Livalo 4 Mg Tabs  (Pitavastatin calcium) ..... One by mouth monday and friday nights  Problem # 4:  HYPERTENSION (ICD-401.9) This is controlled on current medications. His updated medication list for this problem includes:    Adult Aspirin Low Strength 81 Mg Tbdp (Aspirin) .Marland Kitchen... 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 We will repeat those studies today. His updated medication list for this problem includes:    Adult Aspirin Low Strength 81 Mg Tbdp (Aspirin) .Marland Kitchen... 1 once daily  Orders: Carotid Duplex (Carotid Duplex)  Patient Instructions: 1)  Your physician wants you to follow-up in: 1 year with Dr. Dossie Arbour in our [place] office.  You will receive a reminder letter in the mail two months in advance. If you don't receive a letter, please call our office to schedule the follow-up appointment. 2)  Your physician has requested that you have a carotid duplex. This test is an ultrasound of the carotid arteries in your neck. It looks at blood flow through these arteries that supply the brain with blood. Allow one hour for this exam. There are no restrictions or special instructions.

## 2010-09-14 NOTE — Assessment & Plan Note (Signed)
Summary: 3 month rov/njr   Vital Signs:  Patient profile:   70 year old male Height:      72 inches Weight:      218 pounds BMI:     29.67 Temp:     98.2 degrees F oral Pulse rate:   76 / minute Resp:     14 per minute BP sitting:   130 / 70  (left arm)  Vitals Entered By: Willy Eddy, LPN (February 04, 2009 8:20 AM)  CC:  roa labs.  History of Present Illness: Stable  Hypertension History:      He denies headache, chest pain, palpitations, dyspnea with exertion, orthopnea, PND, peripheral edema, visual symptoms, neurologic problems, syncope, and side effects from treatment.        Positive major cardiovascular risk factors include male age 35 years old or older, hyperlipidemia, hypertension, and family history for ischemic heart disease (males less than 74 years old).  Negative major cardiovascular risk factors include no history of diabetes and non-tobacco-user status.        Positive history for target organ damage include ASHD (either angina/prior MI/prior CABG) and peripheral vascular disease.  Further assessment for target organ damage reveals no history of stroke/TIA.     Problems Prior to Update: 1)  Dupuytren's Contracture, Right  (ICD-728.6) 2)  Gastroenteritis, Viral  (ICD-008.8) 3)  Chest Pain, Pleuritic  (ICD-786.52) 4)  Psa, Increased  (ICD-790.93) 5)  Preventive Health Care  (ICD-V70.0) 6)  Occlusion&stenos Carotid Art w/o Mention Infarct  (ICD-433.10) 7)  Flank Pain, Left  (ICD-789.09) 8)  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) 9)  Benign Prostatic Hypertrophy, 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) 10)  Family History of Cad Male 1st Degree Relative <50  (ICD-V17.3) 11)  Hypertension  (ICD-401.9) 12)  Gerd  (ICD-530.81) 13)  Dvt, Hx of  (ICD-V12.51) 14)  Osteoarthritis  (ICD-715.90) 15)  Hyperlipidemia  (ICD-272.4) 16)  Coronary Artery Disease  (ICD-414.00)  Medications Prior to Update: 1)  Vytorin 10-40 Mg  Tabs (Ezetimibe-Simvastatin) .... Once Daily 2)   Prilosec 20 Mg  Cpdr (Omeprazole) .Marland Kitchen.. 1in Am As Needed \\par  3)  Adult Aspirin Low Strength 81 Mg Tbdp (Aspirin) .Marland Kitchen.. 1 Once Daily  Current Medications (verified): 1)  Vytorin 10-40 Mg  Tabs (Ezetimibe-Simvastatin) .... Once Daily 2)  Adult Aspirin Low Strength 81 Mg Tbdp (Aspirin) .Marland Kitchen.. 1 Once Daily  Allergies (verified): 1)  ! Vioxx 2)  ! Morphine  Past History:  Family History: Last updated: 09/06/2007 Family History of CAD Male 1st degree relative <50 Family History High cholesterol Family History Hypertension  Social History: Last updated: 04/26/2007 Retired Married Former Smoker Alcohol use-no Drug use-no  Risk Factors: Smoking Status: quit (04/26/2007)  Past medical, surgical, family and social histories (including risk factors) reviewed, and no changes noted (except as noted below).  Past Medical History: Reviewed history from 04/30/2007 and no changes required. Coronary artery disease Hyperlipidemia Osteoarthritis DVT, hx of GERD Hypertension  Past Surgical History: Reviewed history from 04/26/2007 and no changes required. 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 Family History of CAD Male 1st degree relative <50 Family History High cholesterol Family History Hypertension  Social History: Reviewed history from 04/26/2007 and no changes required. Retired Married Former Smoker Alcohol use-no Drug use-no  Review of Systems  The patient denies anorexia, fever, weight loss, weight gain, vision loss, decreased hearing, hoarseness, chest pain, syncope, dyspnea on exertion, peripheral edema, prolonged cough,  headaches, hemoptysis, abdominal pain, melena, hematochezia, severe indigestion/heartburn, hematuria, incontinence, genital sores, muscle weakness, suspicious skin lesions, transient blindness, difficulty walking, depression, unusual weight change, abnormal bleeding,  enlarged lymph nodes, angioedema, breast masses, and testicular masses.         stable  Physical Exam  General:  well-developed.  alert and well-nourished.   Head:  normocephalic and atraumatic.   Eyes:  pupils equal and pupils round.  pupils equal and pupils round.   Ears:  R ear normal and L ear normal.   Nose:  no nasal discharge.  no mucosal pallor.   Mouth:  pharynx pink and moist.  no erythema.   Neck:  No deformities, masses, or tenderness noted. Chest Wall:  surgical scar.   Lungs:  normal respiratory effort and no wheezes.   chest wall pain  Heart:  2normal rate, regular rhythm, and Grade  2 /6 systolic ejection murmur.   Abdomen:  Bowel sounds positive,abdomen soft and non-tender without masses, organomegaly or hernias noted. Msk:  SI joint tenderness and trigger point tenderness.   Pulses:  decreased bilaterally Extremities:  trace left pedal edema and trace right pedal edema.   Neurologic:  alert & oriented X3 and cranial nerves II-XII intact.     Impression & Recommendations:  Problem # 1:  PSA, INCREASED (ICD-790.93) psa is stable at 1 with hx of elevation to 3  Problem # 2:  HYPERTENSION (ICD-401.9)  BP today: 130/70 Prior BP: 130/80 (10/29/2008)  Prior 10 Yr Risk Heart Disease: [name] (04/30/2007)  Labs Reviewed: K+: 3.7 (07/16/2008) Creat: : 1.1 (07/16/2008)   Chol: 115 (01/28/2009)   HDL: 32.20 (01/28/2009)   LDL: 65 (01/28/2009)   TG: 91.0 (01/28/2009)  Complete Medication List: 1)  Vytorin 10-40 Mg Tabs (Ezetimibe-simvastatin) .... Once daily 2)  Adult Aspirin Low Strength 81 Mg Tbdp (Aspirin) .Marland Kitchen.. 1 once daily  Hypertension Assessment/Plan:      The patient's hypertensive risk group is category C: Target organ damage and/or diabetes.  Today's blood pressure is 130/70.  His blood pressure goal is < 140/90.  Patient Instructions: 1)  Please schedule a follow-up appointment in 3 months.  Appended Document: Orders Update    Clinical Lists Changes   Orders: Added new Service order of Zoster (Shingles) Vaccine Live [phone]) - Signed Added new Service order of Admin 1st Vaccine ([id number]) - Signed Observations: Added new observation of ZOSTAVAX EXP: 10/27/2009 (02/04/2009 8:58) Added new observation of ZOSTAVAX RTE: [place] (02/04/2009 8:58) Added new observation of ZOSTAVAXDOSE: 0.5 ml (02/04/2009 8:58) Added new observation of ZOSTAVAX MFR: Merck (02/04/2009 8:58) Added new observation of ZOSTAVAXSITE: left deltoid (02/04/2009 8:58) Added new observation of ZOSTAVAX: Zostavax (02/04/2009 8:58)       Immunizations Administered:  Zostavax # 1:    Vaccine Type: Zostavax    Site: left deltoid    Mfr: Merck    Dose: 0.5 ml    Route: [place]    Exp. Date: 10/27/2009

## 2010-09-14 NOTE — Miscellaneous (Signed)
  Clinical Lists Changes  Observations: Added new observation of CXR RESULTS: Prior median sternotomy.  Heart is borderline in size.   Low lung volumes with left base atelectasis or scarring, similar to   prior study.  Minimal right base atelectasis now present.    IMPRESSION:   Bibasilar atelectasis or scarring.  Low lung volumes.  (03/18/2010 10:45) Added new observation of CARDCATHFIND:  CONCLUSION:   1. Preserved overall left ventricular function.   2. Patent saphenous vein graft to ramus intermedius and diagonal.   3. Patent internal mammary to the LAD.   4. Patent and dominant circumflex with previously placed stent in the       mid vessel which is widely patent.   5. Small to no more than moderate obtuse marginal with an ostial 70-       75% stenosis.      DISPOSITION:  At the present time, I would lean towards medical therapy.   We will get a D-dimer as he had a prior DVT.  GI studies have been   recommended to him previously.  I will discuss this case with Dr.   Juanda Chance.     (03/18/2010 10:45)      Cardiac Cath  Procedure date:  03/18/2010  Findings:       CONCLUSION:   1. Preserved overall left ventricular function.   2. Patent saphenous vein graft to ramus intermedius and diagonal.   3. Patent internal mammary to the LAD.   4. Patent and dominant circumflex with previously placed stent in the       mid vessel which is widely patent.   5. Small to no more than moderate obtuse marginal with an ostial 70-       75% stenosis.      DISPOSITION:  At the present time, I would lean towards medical therapy.   We will get a D-dimer as he had a prior DVT.  GI studies have been   recommended to him previously.  I will discuss this case with Dr.   Juanda Chance.      CXR  Procedure date:  03/18/2010  Findings:      Prior median sternotomy.  Heart is borderline in size.   Low lung volumes with left base atelectasis or scarring, similar to   prior study.  Minimal right  base atelectasis now present.    IMPRESSION:   Bibasilar atelectasis or scarring.  Low lung volumes.

## 2010-09-14 NOTE — Progress Notes (Signed)
Summary: wants relief   Phone Note Call from Patient Call back at Home Phone [phone]   Caller: [facility] mail Summary of Call: has a sinus infection, left ear clogged. wants relief. [facility] Initial call taken by: Warnell Forester,  June 04, 2010 9:03 AM  Follow-up for Phone Call        offered an appointment ,but refused- will try otc meds and call if not better Follow-up by: Willy Eddy, LPN,  June 04, 2010 9:15 AM

## 2010-09-14 NOTE — Letter (Signed)
Summary: [facility]  [facility]   Imported By: Maryln Gottron 07/15/2010 10:46:09  _____________________________________________________________________  External Attachment:    Type:   Image     Comment:   External Document

## 2010-09-14 NOTE — Progress Notes (Signed)
Summary: Pt return call  Phone Note Call from Patient Call back at [phone]   Summary of Call: Pt return call Initial call taken by: Judie Grieve,  April 23, 2010 3:39 PM  Follow-up for Phone Call        I called the pt. Follow-up by: Sherri Rad, RN, BSN,  April 23, 2010 4:59 PM

## 2010-09-14 NOTE — Letter (Signed)
Summary: [facility]  [facility]   Imported By: Maryln Gottron 09/25/2008 13:50:14  _____________________________________________________________________  External Attachment:    Type:   Image     Comment:   External Document

## 2010-09-14 NOTE — Letter (Signed)
Summary: Medical Information   Medical Information   Imported By: Kassie Mends 10/05/2009 14:57:13  _____________________________________________________________________  External Attachment:    Type:   Image     Comment:   External Document

## 2010-09-14 NOTE — Assessment & Plan Note (Signed)
Summary: 3 month rov/;njr   Vital Signs:  Patient profile:   70 year old male Height:      72 inches Weight:      220 pounds BMI:     29.95 Temp:     98.2 degrees F oral Pulse rate:   72 / minute Resp:     14 per minute BP sitting:   130 / 60  (left arm)  Vitals Entered By: Willy Eddy, LPN (November 05, 2009 9:48 AM) CC: roa labs, Lipid Management, Hypertension Management   Primary Care Provider:  Stacie Glaze MD  CC:  roa labs, Lipid Management, and Hypertension Management.  History of Present Illness: follow up the trial of livalo failed to keep the lipids at goal and has risk of coronary artery disease  HAS AN APPOINTMENT WITH A CARDIOLOGIST IN [place]  Hypertension History:      Positive major cardiovascular risk factors include male age 13 years old or older, hyperlipidemia, hypertension, and family history for ischemic heart disease (males less than 66 years old).  Negative major cardiovascular risk factors include no history of diabetes and non-tobacco-user status.        Positive history for target organ damage include ASHD (either angina/prior MI/prior CABG) and peripheral vascular disease.  Further assessment for target organ damage reveals no history of stroke/TIA.    Lipid Management History:      Positive NCEP/ATP III risk factors include male age 29 years old or older, HDL cholesterol less than 40, family history for ischemic heart disease (males less than 53 years old), hypertension, ASHD (either angina/prior MI/prior CABG), and peripheral vascular disease.  Negative NCEP/ATP III risk factors include non-diabetic, non-tobacco-user status, no prior stroke/TIA, and no history of aortic aneurysm.     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) 3)  Cad, Artery Bypass Graft   (ICD-414.04) 4)  Hypertension  (ICD-401.9) 5)  Hyperlipidemia  (ICD-272.4) 6)  Dvt, Hx of  (ICD-V12.51) 7)  Gerd  (ICD-530.81) 8)  Family History of Cad Male 1st Degree Relative <50  (ICD-V17.3) 9)  Chest Pain, Pleuritic  (ICD-786.52) 10)  Animal Bite  (ICD-919.8) 11)  Acute Bronchitis  (ICD-466.0) 12)  Dupuytren's Contracture, Right  (ICD-728.6) 13)  Gastroenteritis, Viral  (ICD-008.8) 14)  Psa, Increased  (ICD-790.93) 15)  Preventive Health Care  (ICD-V70.0) 16)  Occlusion&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 1 Once Daily 2)  Rapaflo 4 Mg Caps (Silodosin) .Marland Kitchen.. 1 Once Daily 3)  Livalo 4 Mg Tabs (Pitavastatin Calcium) .... One By Mouth Monday and Friday Nights  Allergies (verified): 1)  ! Vioxx 2)  ! Morphine  Past History:  Family History: Last updated: 09-04-09  Patients mother died with history of heart attack, stroke, and diabetes. Patients father had a stroke, and patient has one sister who  died from cancer.  He has a brother who had severe peripheral arterial   disease and required leg amputations.  He had diabetes.      Social History: Last updated: September 04, 2009  The patient lives with his wife. He has no intake  of tobacco or alcohol. The patient previously worked with the [employer]  and also at [employer] full-time. However, he under total disability at this time secondary to his coronary problems.   Risk Factors: Smoking Status: quit (11/05/2009)  Past medical, surgical, family and social histories (including risk factors) reviewed, and no changes noted (except as noted below).  Past Medical History: Reviewed history from 08/11/2009 and no changes  required. 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  HYPERTENSION (ICD-401.9) HYPERLIPIDEMIA (ICD-272.4) DVT, HX OF (ICD-V12.51) GERD (ICD-530.81) FAMILY HISTORY OF CAD MALE 1ST DEGREE RELATIVE <50 (ICD-V17.3) CHEST PAIN, PLEURITIC (ICD-786.52) ANIMAL BITE (ICD-919.8) ACUTE BRONCHITIS (ICD-466.0) DUPUYTREN'S CONTRACTURE, RIGHT (ICD-728.6) GASTROENTERITIS, VIRAL (ICD-008.8) PSA, INCREASED (ICD-790.93) PREVENTIVE HEALTH CARE (ICD-V70.0) OCCLUSION&STENOS CAROTID ART W/O MENTION INFARCT (ICD-433.10) FLANK PAIN, LEFT (ICD-789.09) CHRONIC PROSTATITIS (ICD-601.1) BENIGN PROSTATIC HYPERTROPHY, WITH URINARY OBSTRUCTION (ICD-600.01) OSTEOARTHRITIS (ICD-715.90)  Past Surgical History: Reviewed history from 08/11/2009 and no changes required.  PCI and  coronary bypass surgery.   bilateral hip replacements  Right knee arthroscopy x 2  bilateral blepharoplasty of the upper eyelids in 1993  ruptured disk of the lumbar spine in 1984  appendectomy  Family History: Reviewed history from 08/11/2009 and no changes required.  Patients mother died with history of heart attack, stroke, and diabetes. Patients father had a stroke, and patient has one sister who  died from cancer.  He has a brother who had severe peripheral arterial   disease and required leg amputations.  He had diabetes.      Social History: Reviewed history from 08/11/2009 and no changes required.  The patient lives with his wife. He has no intake of tobacco or alcohol. The patient previously worked with the [employer]  and also at [employer] full-time. However, he under total disability at this time secondary to his coronary problems.   Review of Systems  The patient denies anorexia, fever, weight loss, weight gain, vision loss, decreased hearing, hoarseness, chest pain, syncope, dyspnea on  exertion, peripheral edema, prolonged cough, headaches, hemoptysis, abdominal pain, melena, hematochezia, severe indigestion/heartburn, hematuria, incontinence, genital sores, muscle weakness, suspicious skin lesions, transient blindness, difficulty walking, depression, unusual weight change, abnormal bleeding, enlarged lymph nodes, angioedema, and breast masses.    Physical Exam  General:  well-developed.  alert and well-nourished.   Head:  normocephalic and atraumatic.   Eyes:  pupils equal and pupils round.  pupils equal and pupils round.   Ears:  R ear normal and L ear normal.   Mouth:  pharynx pink and moist.  no erythema.   Neck:  No deformities, masses, or tenderness noted. Lungs:  increased bronchial breath sound with decreased pealk flow Heart:  Normal rate and regular rhythm. S1 and S2 normal without gallop, murmur, click, rub or other extra sounds. Abdomen:  Bowel sounds positive,abdomen soft and non-tender without masses, organomegaly or hernias noted.   Impression & Recommendations:  Problem # 1:  HYPERTENSION (ICD-401.9) stable BP today: 130/60 Prior BP: 127/67 (08/17/2009)  Prior 10 Yr Risk Heart Disease: [name] (04/30/2007)  Labs Reviewed: K+: 3.7 (07/16/2008) Creat: : 1.1 (07/16/2008)   Chol: 215 (10/28/2009)   HDL: 39.40 (10/28/2009)   LDL: 103 (07/28/2009)   TG: 178.0 (10/28/2009)  Problem # 2:  HYPERLIPIDEMIA (ICD-272.4) failed pulse livalo needs tight control at agressive control His updated medication list for this problem includes:    Vytorin 10-40 Mg Tabs (Ezetimibe-simvastatin) ..... One mwf  Labs Reviewed: SGOT: 20 (10/28/2009)   SGPT: 20 (10/28/2009)  Lipid Goals: Chol Goal: 200 (04/30/2007)   HDL Goal: 40 (04/30/2007)   LDL Goal: 100 (04/30/2007)   TG Goal: 150 (04/30/2007)  Prior 10 Yr Risk Heart Disease: [name] (04/30/2007)   HDL:39.40 (10/28/2009), 36.70 (07/28/2009)  LDL:103 (07/28/2009), 65 (32/95/1884)  Chol:215 (10/28/2009), 161 (07/28/2009)   Trig:178.0 (10/28/2009), 107.0 (07/28/2009)  Problem # 3:  LOC OSTEOARTHROS NOT SPEC PRIM/SEC LOWER LEG (ICD-715.36) Assessment: Deteriorated pain control wi the vicodin consideraton fr ortho referral deferred His updated medication list for this problem includes:    Adult Aspirin Low Strength 81 Mg Tbdp (Aspirin) .Marland Kitchen... 1 once daily    Oxycodone-acetaminophen 5-325 Mg Tabs (Oxycodone-acetaminophen) ..... One by mouth q 6 hours as needed pain  Discussed use of medications, application of heat or cold, and exercises.   Problem # 4:  BENIGN PROSTATIC HYPERTROPHY, WITH URINARY OBSTRUCTION (ICD-600.01) Assessment: Improved  His updated medication list for this problem includes:    Rapaflo 4 Mg Caps (Silodosin) .Marland Kitchen... 1 once daily  PSA: 1.19 (01/28/2009)     Complete Medication List: 1)  Adult Aspirin Low Strength 81 Mg Tbdp (Aspirin) .Marland Kitchen.. 1 once daily 2)  Rapaflo 4 Mg Caps (Silodosin) .Marland Kitchen.. 1 once daily 3)  Vytorin 10-40 Mg Tabs (Ezetimibe-simvastatin) .... One mwf 4)  Oxycodone-acetaminophen 5-325 Mg Tabs (Oxycodone-acetaminophen) .... One by mouth q 6 hours as needed pain 5)  Nitrostat 0.4 Mg Subl (Nitroglycerin) .... One sl as needed chest pain  every 5 min  Hypertension Assessment/Plan:      The patient's hypertensive risk group is category C: Target organ damage and/or diabetes.  Today's blood pressure is 130/60.  His blood pressure goal is < 140/90.  Lipid Assessment/Plan:      Based on NCEP/ATP III, the patient's risk factor category is "history of coronary disease, peripheral vascular disease, cerebrovascular disease, or aortic aneurysm".  The patient's lipid goals are as follows: Total cholesterol goal is 200; LDL cholesterol goal is 100; HDL cholesterol goal is 40; Triglyceride goal is 150.  His LDL cholesterol goal has been met.     Patient Instructions: 1)  TAKE THE VYTORIN 10/40 ONE ON    MWF 2)  Please schedule a follow-up appointment in 3 months. 3)  Hepatic Panel prior  to visit, ICD-9:995.20 4)  Lipid Panel prior to visit, ICD-9:272.4 Prescriptions: NITROSTAT 0.4 MG SUBL (NITROGLYCERIN) one SL as needed chest pain  every 5 min  #25 x 3   Entered and Authorized by:   Stacie Glaze MD   Signed by:   Stacie Glaze MD on 11/05/2009   Method used:   Print then Give to Patient   RxID:   [id number] OXYCODONE-ACETAMINOPHEN 5-325 MG TABS (OXYCODONE-ACETAMINOPHEN) ONE by mouth Q 6 HOURS as needed PAIN  #60 x 0   Entered and Authorized by:   Stacie Glaze MD   Signed by:   Stacie Glaze MD on 11/05/2009   Method used:   Print then Give to Patient   RxID:   [id number]

## 2010-09-14 NOTE — Assessment & Plan Note (Signed)
Summary: J PT/SINUS/PS   Vital Signs:  Patient profile:   70 year old male Weight:      220 pounds Temp:     98.2 degrees F oral BP sitting:   140 / 72  (left arm) Cuff size:   large  Vitals Entered By: Sid Falcon LPN (June 07, 2010 3:47 PM)  History of Present Illness: Patient seen as a work in with left ear fullness past 5 or 6 days. Also frontal and maxillary sinus pressure with greenish to brownish discharge especially worse in the morning. Occasional mild headache. No cough. No fever. Denies vertigo symptoms. Tried over-the-counter Mucinex cold and sinus without improvement  Allergies: 1)  ! Vioxx 2)  ! 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  HYPERTENSION (ICD-401.9) HYPERLIPIDEMIA (ICD-272.4) DVT, HX OF (ICD-V12.51) GERD (ICD-530.81) FAMILY HISTORY OF CAD MALE 1ST DEGREE RELATIVE <50 (ICD-V17.3) CHEST PAIN, PLEURITIC (ICD-786.52) ANIMAL BITE (ICD-919.8) ACUTE BRONCHITIS (ICD-466.0) DUPUYTREN'S CONTRACTURE, RIGHT (ICD-728.6) GASTROENTERITIS, VIRAL (ICD-008.8) PSA, INCREASED (ICD-790.93) PREVENTIVE HEALTH CARE (ICD-V70.0) OCCLUSION&STENOS CAROTID ART W/O MENTION INFARCT (ICD-433.10) FLANK PAIN, LEFT (ICD-789.09) CHRONIC PROSTATITIS (ICD-601.1) BENIGN PROSTATIC HYPERTROPHY, WITH URINARY OBSTRUCTION (ICD-600.01) OSTEOARTHRITIS (ICD-715.90) PMH reviewed for relevance  Physical Exam  General:  Well-developed,well-nourished,in no acute distress; alert,appropriate and cooperative throughout examination Ears:  left serous effusion. Right TM normal Nose:  swollen turbinates with mild erythema bilaterally. Mouth:  Oral mucosa and oropharynx without lesions or exudates.  Teeth in good repair. Neck:  No deformities, masses, or tenderness noted. Lungs:  Normal  respiratory effort, chest expands symmetrically. Lungs are clear to auscultation, no crackles or wheezes. Heart:  normal rate and regular rhythm.     Impression & Recommendations:  Problem # 1:  OTITIS MEDIA, SEROUS (ICD-381.4) explained these sometimes takes weeks or months to resolve. Only L side involved.  Problem # 2:  SINUSITIS, ACUTE (ICD-461.9)  His updated medication list for this problem includes:    Azithromycin 250 Mg Tabs (Azithromycin) .Marland Kitchen... 2 by mouth today then one by mouth once daily for 4 days  Complete Medication List: 1)  Adult Aspirin Low Strength 81 Mg Tbdp (Aspirin) .Marland Kitchen.. 1 once daily 2)  Rapaflo 8 Mg Caps (Silodosin) .Marland Kitchen.. 1 cap once daily 3)  Vytorin 10-40 Mg Tabs (Ezetimibe-simvastatin) .... 1/2tab every other day 4)  Oxycodone-acetaminophen 5-325 Mg Tabs (Oxycodone-acetaminophen) .... One by mouth q 6 hours as needed pain 5)  Nitrostat 0.4 Mg Subl (Nitroglycerin) .... One sl as needed chest pain  every 5 min 6)  Axiron Pump  .... As needed 7)  Isosorbide Mononitrate Cr 30 Mg Xr24h-tab (Isosorbide mononitrate) .Marland Kitchen.. 1 tab once daily 8)  Azithromycin 250 Mg Tabs (Azithromycin) .... 2 by mouth today then one by mouth once daily for 4 days  Patient Instructions: 1)  Acute sinusitis symptoms for less than 10 days are not helped by antibiotics. Use warm moist compresses, and over the counter decongestants( only as directed). Call if no improvement in 5-7 days, sooner if increasing pain, fever, or new symptoms.  Prescriptions: AZITHROMYCIN 250 MG TABS (AZITHROMYCIN) 2 by mouth today then one by mouth once daily for 4 days  #6 x 0   Entered and Authorized by:   Evelena Peat MD   Signed by:   Evelena Peat MD on 06/07/2010   Method used:   Electronically to  [facility] [id number][facility] (retail)       [address]       [place], [place]  [address]       Ph: [phone]       Fax: [phone]   RxID:   [id number]    Orders Added: 1)  Est.  Patient Level III [24401]   Immunization History:  Influenza Immunization History:    Influenza:  historical (05/31/2010)   Immunization History:  Influenza Immunization History:    Influenza:  Historical (05/31/2010)

## 2010-09-14 NOTE — Progress Notes (Signed)
Summary: back pain  Phone Note Call from Patient   Caller: Patient Call For: Stacie Glaze MD Summary of Call: Pt stood up from table and had shooting pain down lower back and left leg.  Cannot stand up straight today.  Has pain pills but no muscle relaxers. [facility] Initial call taken by: Lynann Beaver CMA,  November 30, 2009 9:12 AM  Follow-up for Phone Call        per dr [name] may have flexeril 10 three times a day #30 with 1 refill Follow-up by: Willy Eddy, LPN,  November 30, 2009 12:47 PM    New/Updated Medications: FLEXERIL 10 MG TABS (CYCLOBENZAPRINE HCL) one by mouth three times a day as needed back pain Prescriptions: FLEXERIL 10 MG TABS (CYCLOBENZAPRINE HCL) one by mouth three times a day as needed back pain  #30 x 1   Entered by:   Lynann Beaver CMA   Authorized by:   Stacie Glaze MD   Signed by:   Lynann Beaver CMA on 11/30/2009   Method used:   Electronically to        [facility] [id number][facility] (retail)       [address]       [place], [place]  [address]       Ph: [phone]       Fax: [phone]   RxID:   [id number]

## 2010-09-14 NOTE — Miscellaneous (Signed)
Summary: Orders Update  Clinical Lists Changes  Orders: Added new Test order of Carotid Duplex (Carotid Duplex) - Signed 

## 2010-09-14 NOTE — Assessment & Plan Note (Signed)
Summary: roa/db   Vital Signs:  Patient Profile:   70 Years Old Male Height:     72 inches Weight:      218 pounds Temp:     97.6 degrees F Pulse rate:   76 / minute Resp:     14 per minute BP sitting:   130 / 80  (left arm)  Vitals Entered By: Willy Eddy, LPN (April 30, 2007 10:15 AM)                 Chief Complaint:  roa/fasting this am.  History of Present Illness: Acute buring in chest with deep breath. No fever or chills Fasting for lipids   Dyspepsia History:      There is a prior history of GERD.    Hypertension History:      He denies headache, dyspnea with exertion, orthopnea, PND, peripheral edema, visual symptoms, neurologic problems, and syncope.  He notes no problems with any antihypertensive medication side effects.        Positive major cardiovascular risk factors include male age 14 years old or older, hyperlipidemia, hypertension, and family history for ischemic heart disease (males less than 50 years old).  Negative major cardiovascular risk factors include no history of diabetes and non-tobacco-user status.        Positive history for target organ damage include ASHD (either angina; prior MI; prior CABG) and peripheral vascular disease.  Further assessment for target organ damage reveals no history of stroke/TIA.    Lipid Management History:      Positive NCEP/ATP III risk factors include male age 72 years old or older, HDL cholesterol less than 40, family history for ischemic heart disease (males less than 44 years old), hypertension, ASHD (atherosclerotic heart disease), and peripheral vascular disease.  Negative NCEP/ATP III risk factors include non-diabetic, non-tobacco-user status, no prior stroke/TIA, and no history of aortic aneurysm.       Current Allergies: ! VIOXX ! MORPHINE  Past Medical History:    Reviewed history from 04/26/2007 and no changes required:       Coronary artery disease       Hyperlipidemia  Osteoarthritis       DVT, hx of       GERD       Hypertension  Past Surgical History:    Reviewed history from 04/26/2007 and no changes required:       Coronary artery bypass graft       Total hip replacement   Social History:    Reviewed history from 04/26/2007 and no changes required:       Retired       Married       Former Smoker       Alcohol use-no       Drug use-no    Review of Systems       The patient complains of muscle weakness.  The patient denies weight loss, vision loss, hoarseness, chest pain, dyspnea on exhertion, prolonged cough, hemoptysis, abdominal pain, melena, severe indigestion/heartburn, suspicious skin lesions, transient blindness, difficulty walking, depression, unusual weight change, abnormal bleeding, enlarged lymph nodes, and angioedema.     Physical Exam  General:     well-developed.   Head:     normocephalic.   Eyes:     pupils equal and pupils round.   Nose:     no nasal discharge.   Mouth:     pharynx pink and moist.   Neck:     supple.  Chest Wall:     surgical scar.   Lungs:     normal respiratory effort and no wheezes.   Heart:     normal rate and regular rhythm.   Abdomen:     soft and no guarding.   Extremities:     No clubbing, cyanosis, edema, or deformity noted with normal full range of motion of all joints.   Neurologic:     alert & oriented X3.      Impression & Recommendations:  Problem # 1:  GERD (ICD-530.81)  His updated medication list for this problem includes:    Prevacid Solutab 30 Mg Tbdp (Lansoprazole) ..... One by mouth bid  Diagnostics Reviewed:  Discussed lifestyle modifications, diet, antacids/medications, and preventive measures. Handout provided.   Problem # 2:  HYPERTENSION (ICD-401.9)  BP today: 130/80  10 Yr Risk Heart Disease: [name]  Labs Reviewed: Creat: 1.1 (09/19/2006) Chol: 132 (09/19/2006)   HDL: 28.3 (09/19/2006)   LDL: DEL (09/19/2006)   TG: 208 (09/19/2006)   Problem # 3:   CORONARY ARTERY DISEASE (ICD-414.00)  His updated medication list for this problem includes:    Plavix 75 Mg Tabs (Clopidogrel bisulfate) ..... Once daily  Labs Reviewed: Chol: 132 (09/19/2006)   HDL: 28.3 (09/19/2006)   LDL: DEL (09/19/2006)   TG: 208 (09/19/2006)  Lipid Goals: Chol Goal: 200 (04/30/2007)   HDL Goal: 40 (04/30/2007)   LDL Goal: 100 (04/30/2007)   TG Goal: 150 (04/30/2007)   Problem # 4:  OSTEOARTHRITIS (ICD-715.90) Discussed use of medications, application of heat or cold, and exercises.   Problem # 5:  HYPERLIPIDEMIA (ICD-272.4)  His updated medication list for this problem includes:    Vytorin 10-40 Mg Tabs (Ezetimibe-simvastatin) ..... Once daily  Labs Reviewed: Chol: 132 (09/19/2006)   HDL: 28.3 (09/19/2006)   LDL: DEL (09/19/2006)   TG: 208 (09/19/2006) SGOT: 28 (09/19/2006)   SGPT: 28 (09/19/2006)  Lipid Goals: Chol Goal: 200 (04/30/2007)   HDL Goal: 40 (04/30/2007)   LDL Goal: 100 (04/30/2007)   TG Goal: 150 (04/30/2007)  10 Yr Risk Heart Disease: [name]  Orders: Venipuncture (91478) TLB-Lipid Panel (80061-LIPID) TLB-Hepatic/Liver Function Pnl (80076-HEPATIC)   Complete Medication List: 1)  Vytorin 10-40 Mg Tabs (Ezetimibe-simvastatin) .... Once daily 2)  Plavix 75 Mg Tabs (Clopidogrel bisulfate) .... Once daily 3)  Prevacid Solutab 30 Mg Tbdp (Lansoprazole) .... One by mouth bid  Hypertension Assessment/Plan:      The patient's hypertensive risk group is category C: Target organ damage and/or diabetes.  Today's blood pressure is 130/80.  His blood pressure goal is < 140/90.  Lipid Assessment/Plan:      Based on NCEP/ATP III, the patient's risk factor category is "history of coronary disease, peripheral vascular disease, cerebrovascular disease, or aortic aneurysm".  From this information, the patient's calculated lipid goals are as follows: Total cholesterol goal is 200; LDL cholesterol goal is 100; HDL cholesterol goal is 40; Triglyceride goal is  150.  His LDL cholesterol goal has been met.     Patient Instructions: 1)  Please schedule a follow-up appointment in 2 months.    Prescriptions: PLAVIX 75 MG  TABS (CLOPIDOGREL BISULFATE) once daily  #90 x 3   Entered by:   Willy Eddy, LPN   Authorized by:   Stacie Glaze MD   Signed by:   Willy Eddy, LPN on 29/56/2130   Method used:   Print then Give to Patient   RxID:   [id number]  ]

## 2010-09-14 NOTE — Letter (Signed)
Summary: [facility]  [facility] Provider: This provider was preselected by the workflow.  Signature: The signature status of this document was preset by the workflow  Processed by [employer] Local Indexer Client @ Monday, June 29, 2009 12:59:06 PM using version:2010.1.2.11(2.4)   Manually Indexed By: [id number]  idlBatchDetail: [id number]   _____________________________________________________________________  External Attachment:    Type:   Image     Comment:   External Document

## 2010-09-14 NOTE — Procedures (Signed)
Summary: Upper GI Endoscopy/[facility]  Upper GI Endoscopy/[facility]   Imported By: Maryln Gottron 04/08/2010 14:34:26  _____________________________________________________________________  External Attachment:    Type:   Image     Comment:   External Document

## 2010-09-14 NOTE — Miscellaneous (Signed)
Summary: Orders Update  Clinical Lists Changes  Orders: Added new Test order of Arterial Duplex Lower Extremity (Arterial Duplex Low) - Signed 

## 2010-09-14 NOTE — Assessment & Plan Note (Signed)
Summary: swollen glands, multiple ticks recently/nn   Vital Signs:  Patient profile:   70 year old male Weight:      227 pounds Temp:     97.8 degrees F oral BP sitting:   124 / 80  (left arm) Cuff size:   large  Vitals Entered By: Kathrynn Speed CMA (March 08, 2010 9:50 AM) CC: Left swollen glands from multiple tick bites, x 4 days, they itched & had ring around them, feeling tired x one week, sore throat x 4days, src, Headache   History of Present Illness: Several recent tick bites over the past several weeks. Combination of deer ticks and regular ticks.  Onset Friday malaise, headaches, sore throat.  No fever or rashes. Denies nausea, vomiting, abd pain, or dysuria.  Current Medications (verified): 1)  Adult Aspirin Low Strength 81 Mg Tbdp (Aspirin) .Marland Kitchen.. 1 Once Daily 2)  Rapaflo 4 Mg Caps (Silodosin) .Marland Kitchen.. 1 Once Daily 3)  Vytorin 10-40 Mg Tabs (Ezetimibe-Simvastatin) .... One Mwf 4)  Oxycodone-Acetaminophen 5-325 Mg Tabs (Oxycodone-Acetaminophen) .... One By Mouth Q 6 Hours As Needed Pain 5)  Nitrostat 0.4 Mg Subl (Nitroglycerin) .... One Sl As Needed Chest Pain  Every 5 Min 6)  Axiron .... Pumps As Directed  Allergies (verified): 1)  ! Vioxx 2)  ! 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 No apparent alopecia or balding. Ears:  External ear exam shows no significant lesions or deformities.  Otoscopic examination reveals clear canals, tympanic membranes are intact bilaterally without bulging, retraction, inflammation or discharge. Hearing is grossly normal bilaterally. Mouth:  Oral mucosa and oropharynx without lesions or exudates.  Teeth in good repair. Neck:  No deformities, masses, or tenderness noted. Lungs:  Normal respiratory effort, chest expands symmetrically. Lungs are clear to auscultation, no crackles or wheezes. Heart:  normal rate, regular rhythm, and no murmur.   Abdomen:  soft and non-tender.   Skin:  no rashes.   Cervical Nodes:  No lymphadenopathy noted   Impression & Recommendations:  Problem # 1:  HEADACHE (ICD-784.0) ?viral but with hx multiple recent tick bites will cover with Doxycycline. His updated medication list for this problem includes:    Adult Aspirin Low Strength 81 Mg Tbdp (Aspirin) .Marland Kitchen... 1 once daily    Oxycodone-acetaminophen 5-325 Mg Tabs (Oxycodone-acetaminophen) ..... One by mouth q 6 hours as needed pain  Complete Medication List: 1)  Adult Aspirin Low Strength 81 Mg Tbdp (Aspirin) .Marland Kitchen.. 1 once daily 2)  Rapaflo 4 Mg Caps (Silodosin) .Marland Kitchen.. 1 once daily 3)  Vytorin 10-40 Mg Tabs (Ezetimibe-simvastatin) .... One mwf 4)  Oxycodone-acetaminophen 5-325 Mg Tabs (Oxycodone-acetaminophen) .... One by mouth q 6 hours as needed pain 5)  Nitrostat 0.4 Mg Subl (Nitroglycerin) .Marland KitchenMarland KitchenMarland Kitchen  One sl as needed chest pain  every 5 min 6)  Axiron  .... Pumps as  directed 7)  Doxycycline Hyclate 100 Mg Caps (Doxycycline hyclate) .... One by mouth two times a day for 10 days  Patient Instructions: 1)  Follow up promptly for any worsening headaches, fever, or any new rashes. Prescriptions: DOXYCYCLINE HYCLATE 100 MG CAPS (DOXYCYCLINE HYCLATE) one by mouth two times a day for 10 days  #20 x 0   Entered and Authorized by:   Evelena Peat MD   Signed by:   Evelena Peat MD on 03/08/2010   Method used:   Electronically to        [facility]  [address] [phone] (retail)       [address]       [place], [place]  [address]       Ph: [phone]       Fax: [phone]   RxID:   [id number]

## 2010-09-14 NOTE — Progress Notes (Signed)
Summary: Medication at Pharmacy  Phone Note Call from Patient Call back at [phone]   Caller: Patient Summary of Call: Pt states the pharmacy has not received his medication that was to [facility] [place].  Initial call taken by: Trixie Dredge,  March 09, 2010 8:17 AM  Follow-up for Phone Call        Rx called in, pt i nformed Follow-up by: Sid Falcon LPN,  March 09, 2010 9:57 AM

## 2010-09-14 NOTE — Assessment & Plan Note (Signed)
Summary: FUP/CCM   Vital Signs:  Patient Profile:   70 Years Old Male Height:     72 inches Weight:      225 pounds Temp:     97.5 degrees F oral Pulse rate:   76 / minute Resp:     14 per minute BP sitting:   124 / 70  (left arm)  Vitals Entered By: Willy Eddy, LPN (November 08, 2007 11:46 AM)                 Chief Complaint:  roa- had cataract sur on tuesday--stopped retaining fluid after being taken off of plavix by dr Juanda Chance.  History of Present Illness: Current Problems:  BENIGN PROSTATIC HYPERTROPHY, WITH URINARY OBSTRUCTION (ICD-600.01)  nocturia reduced FAMILY HISTORY OF CAD MALE 1ST DEGREE RELATIVE <50 (ICD-V17.3) HYPERTENSION (ICD-401.9) GERD (ICD-530.81)stable DVT, HX OF (ICD-V12.51) OSTEOARTHRITIS (ICD-715.90) HYPERLIPIDEMIA (ICD-272.4) CORONARY ARTERY DISEASE (ICD-414.00)no chest pain cataract surgery was successfully     Hypertension History:      He denies headache, chest pain, palpitations, dyspnea with exertion, orthopnea, PND, peripheral edema, visual symptoms, neurologic problems, syncope, and side effects from treatment.        Positive major cardiovascular risk factors include male age 17 years old or older, hyperlipidemia, hypertension, and family history for ischemic heart disease (males less than 90 years old).  Negative major cardiovascular risk factors include no history of diabetes and non-tobacco-user status.        Positive history for target organ damage include ASHD (either angina; prior MI; prior CABG) and peripheral vascular disease.  Further assessment for target organ damage reveals no history of stroke/TIA.       Prior Medication List:  VYTORIN 10-40 MG  TABS (EZETIMIBE-SIMVASTATIN) once daily BAYER LOW STRENGTH 81 MG  TBEC (ASPIRIN) once daily ZEGERID 40-1100 MG  CAPS (OMEPRAZOLE-SODIUM BICARBONATE) at bedtime PRILOSEC 20 MG  CPDR (OMEPRAZOLE) 1in am   Current Allergies (reviewed today): ! VIOXX ! MORPHINE  Past Medical  History:    Reviewed history from 04/30/2007 and no changes required:       Coronary artery disease       Hyperlipidemia       Osteoarthritis       DVT, hx of       GERD       Hypertension  Past Surgical History:    Reviewed history from 04/26/2007 and no changes required:       Coronary artery bypass graft       Total hip replacement   Family History:    Reviewed history from 09/06/2007 and no changes required:       Family History of CAD Male 1st degree relative <50       Family History High cholesterol       Family History Hypertension  Social History:    Reviewed history from 04/26/2007 and no changes required:       Retired       Married       Former Smoker       Alcohol use-no       Drug use-no    Review of Systems  The patient denies anorexia, fever, weight loss, weight gain, vision loss, decreased hearing, hoarseness, chest pain, syncope, dyspnea on exhertion, peripheral edema, prolonged cough, hemoptysis, abdominal pain, melena, hematochezia, severe indigestion/heartburn, hematuria, incontinence, genital sores, muscle weakness, suspicious skin lesions, transient blindness, difficulty walking, depression, unusual weight change, abnormal bleeding, enlarged lymph nodes, angioedema, breast masses, and testicular  masses.     Physical Exam  General:     well-developed.  alert and well-nourished.   Head:     normocephalic.   Eyes:     pupils equal and pupils round.  pupils equal and pupils round.   Nose:     no nasal discharge.   Mouth:     pharynx pink and moist.   Neck:     supple.  full ROM and no masses.   Lungs:     normal respiratory effort and no wheezes.   Heart:     normal rate and regular rhythm.   Abdomen:     soft and no guarding.  no distention, no masses, and no rigidity.   Msk:     no joint tenderness, no joint swelling, and no joint warmth.      Impression & Recommendations:  Problem # 1:  BENIGN PROSTATIC HYPERTROPHY, WITH URINARY  OBSTRUCTION (ICD-600.01) off the antbiotic  Problem # 2:  HYPERTENSION (ICD-401.9)  BP today: 124/70 Prior BP: 140/78 (09/06/2007)  Prior 10 Yr Risk Heart Disease: [name] (04/30/2007)  Labs Reviewed: Creat: 1.0 (08/01/2007) Chol: 129 (04/30/2007)   HDL: 26.3 (04/30/2007)   LDL: 72 (04/30/2007)   TG: 155 (04/30/2007)   Problem # 3:  HYPERLIPIDEMIA (ICD-272.4)  His updated medication list for this problem includes:    Vytorin 10-40 Mg Tabs (Ezetimibe-simvastatin) ..... Once daily  Labs Reviewed: Chol: 129 (04/30/2007)   HDL: 26.3 (04/30/2007)   LDL: 72 (04/30/2007)   TG: 155 (04/30/2007) SGOT: 21 (04/30/2007)   SGPT: 27 (04/30/2007)  Lipid Goals: Chol Goal: 200 (04/30/2007)   HDL Goal: 40 (04/30/2007)   LDL Goal: 100 (04/30/2007)   TG Goal: 150 (04/30/2007)  Prior 10 Yr Risk Heart Disease: [name] (04/30/2007)   Problem # 4:  CORONARY ARTERY DISEASE (ICD-414.00)  His updated medication list for this problem includes:    Bayer Low Strength 81 Mg Tbec (Aspirin) ..... Once daily  Labs Reviewed: Chol: 129 (04/30/2007)   HDL: 26.3 (04/30/2007)   LDL: 72 (04/30/2007)   TG: 155 (04/30/2007)  Lipid Goals: Chol Goal: 200 (04/30/2007)   HDL Goal: 40 (04/30/2007)   LDL Goal: 100 (04/30/2007)   TG Goal: 150 (04/30/2007)   Complete Medication List: 1)  Vytorin 10-40 Mg Tabs (Ezetimibe-simvastatin) .... Once daily 2)  Bayer Low Strength 81 Mg Tbec (Aspirin) .... Once daily 3)  Prilosec 20 Mg Cpdr (Omeprazole) .Marland Kitchen.. 1in am  Hypertension Assessment/Plan:      The patient's hypertensive risk group is category C: Target organ damage and/or diabetes.  Today's blood pressure is 124/70.  His blood pressure goal is < 140/90.   Patient Instructions: 1)  Please schedule a follow-up appointment in 3 months. 2)  Hepatic Panel prior to visit, ICD-9: 995.20 3)  Lipid Panel prior to visit, ICD-9:272.4    ]

## 2010-09-14 NOTE — Letter (Signed)
Summary: [facility]  [facility]   Imported By: Maryln Gottron 10/31/2008 15:47:51  _____________________________________________________________________  External Attachment:    Type:   Image     Comment:   External Document

## 2010-09-14 NOTE — Assessment & Plan Note (Signed)
Summary: groin pain after lifting ./bmw   Vital Signs:  Patient profile:   70 year old male Height:      72 inches Weight:      220 pounds BMI:     29.95 Temp:     98.2 degrees F oral Pulse rate:   72 / minute Resp:     14 per minute BP sitting:   140 / 80  (left arm)  Vitals Entered By: Willy Eddy, LPN (May 26, 2010 12:58 PM) CC: c/o left groin pain after picking up >100 pound dog Is Patient Diabetic? No   Primary Care Provider:  Stacie Glaze MD  CC:  c/o left groin pain after picking up >100 pound dog.  History of Present Illness: one week ago was lifting 60 lbs and felt pain in the right groin burning pain no radiation to the testicles some pain in the back Pt saw Dr Ewing Schlein for colon, and EGD reports reported AAA but the US showed no anyrsm the pt also had a cath for chest pain that did not show increased dz * thr grafts were open  Preventive Screening-Counseling & Management  Alcohol-Tobacco     Smoking Status: quit     Tobacco Counseling: to remain off tobacco products  Problems Prior to Update: 1)  Edema  (ICD-782.3) 2)  Loc Osteoarthros Not Spec Prim/sec Lower Leg  (ICD-715.36) 3)  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) 4)  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) 5)  Cad, Artery Bypass Graft  (ICD-414.04) 6)  Hypertension  (ICD-401.9) 7)  Hyperlipidemia  (ICD-272.4) 8)  Dvt, Hx of  (ICD-V12.51) 9)  Gerd  (ICD-530.81) 10)  Family History of Cad Male 1st Degree Relative <50  (ICD-V17.3) 11)  Chest Pain, Pleuritic  (ICD-786.52) 12)  Dupuytren's Contracture, Right  (ICD-728.6) 13)  Psa, Increased  (ICD-790.93) 14)  Preventive Health Care  (ICD-V70.0) 15)  Occlusion&stenos Carotid Art w/o Mention Infarct  (ICD-433.10) 16)  Flank Pain, Left  (ICD-789.09) 17)  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) 18)  Benign Prostatic Hypertrophy, 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) 19)  Osteoarthritis  (ICD-715.90)  Current Problems  (verified): 1)  Edema  (ICD-782.3) 2)  Loc Osteoarthros Not Spec Prim/sec Lower Leg  (ICD-715.36) 3)  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) 4)  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) 5)  Cad, Artery Bypass Graft  (ICD-414.04) 6)  Hypertension  (ICD-401.9) 7)  Hyperlipidemia  (ICD-272.4) 8)  Dvt, Hx of  (ICD-V12.51) 9)  Gerd  (ICD-530.81) 10)  Family History of Cad Male 1st Degree Relative <50  (ICD-V17.3) 11)  Chest Pain, Pleuritic  (ICD-786.52) 12)  Dupuytren's Contracture, Right  (ICD-728.6) 13)  Psa, Increased  (ICD-790.93) 14)  Preventive Health Care  (ICD-V70.0) 15)  Occlusion&stenos Carotid Art w/o Mention Infarct  (ICD-433.10) 16)  Flank Pain, Left  (ICD-789.09) 17)  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) 18)  Benign Prostatic Hypertrophy, 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) 19)  Osteoarthritis  (ICD-715.90)  Medications Prior to Update: 1)  Adult Aspirin Low Strength 81 Mg Tbdp (Aspirin) .Marland Kitchen.. 1 Once Daily 2)  Rapaflo 8 Mg Caps (Silodosin) .Marland Kitchen.. 1 Cap Once Daily 3)  Vytorin 10-40 Mg Tabs (Ezetimibe-Simvastatin) .... 1/2tab Every Other Day 4)  Oxycodone-Acetaminophen 5-325 Mg Tabs (Oxycodone-Acetaminophen) .... One By Mouth Q 6 Hours As Needed Pain 5)  Nitrostat 0.4 Mg Subl (Nitroglycerin) .... One Sl As Needed Chest Pain  Every 5 Min 6)  Axiron Pump .... As Needed 7)  Isosorbide Mononitrate Cr 30  Mg Xr24h-Tab (Isosorbide Mononitrate) .Marland Kitchen.. 1 Tab Once Daily  Current Medications (verified): 1)  Adult Aspirin Low Strength 81 Mg Tbdp (Aspirin) .Marland Kitchen.. 1 Once Daily 2)  Rapaflo 8 Mg Caps (Silodosin) .Marland Kitchen.. 1 Cap Once Daily 3)  Vytorin 10-40 Mg Tabs (Ezetimibe-Simvastatin) .... 1/2tab Every Other Day 4)  Oxycodone-Acetaminophen 5-325 Mg Tabs (Oxycodone-Acetaminophen) .... One By Mouth Q 6 Hours As Needed Pain 5)  Nitrostat 0.4 Mg Subl (Nitroglycerin) .... One Sl As Needed Chest Pain  Every 5 Min 6)  Axiron Pump .... As Needed 7)  Isosorbide Mononitrate Cr 30 Mg  Xr24h-Tab (Isosorbide Mononitrate) .Marland Kitchen.. 1 Tab Once Daily  Allergies (verified): 1)  ! Vioxx 2)  ! Morphine  Past History:  Family History: Last updated: 22-Aug-2009  Patients mother died with history of heart attack, stroke, and diabetes. Patients father had a stroke, and patient has one sister who  died from cancer.  He has a brother who had severe peripheral arterial   disease and required leg amputations.  He had diabetes.      Social History: Last updated: Aug 22, 2009  The patient lives with his wife. He has no intake of tobacco or alcohol. The patient previously worked with the [employer]  and also at [employer] full-time. However, he under total disability at this time secondary to his coronary problems.   Risk Factors: Smoking Status: quit (05/26/2010)  Past medical, surgical, family and social histories (including risk factors) reviewed, and no changes noted (except as noted below).  Past Medical History: Reviewed history from August 22, 2009 and no changes required. 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  PCI and  coronary bypass surgery.   bilateral  hip replacements  Right knee arthroscopy x 2  bilateral blepharoplasty of the upper eyelids in 1993  ruptured disk of the lumbar spine in 1984  appendectomy  Family History: Reviewed history from 08-22-09 and no changes required.  Patients mother died with history of heart attack, stroke, and diabetes. Patients father had a stroke, and patient has one sister who  died from cancer.  He has a brother who had severe peripheral arterial   disease and required leg amputations.  He had diabetes.      Social History: Reviewed history from 08-22-2009 and no changes required.  The patient lives with his wife. He has no intake of tobacco or alcohol. The patient previously worked with the [employer]  and also at [employer] full-time. However, he under total disability at this time secondary to his coronary problems.   Review of Systems  The patient denies anorexia, fever, weight loss, weight gain, vision loss, decreased hearing, hoarseness, chest pain, syncope, dyspnea on exertion, peripheral edema, prolonged cough, headaches, hemoptysis, abdominal pain, melena, hematochezia, severe indigestion/heartburn, hematuria, incontinence, genital sores, muscle weakness, suspicious skin lesions, transient blindness, difficulty walking, depression, unusual weight change, abnormal bleeding, enlarged lymph nodes, angioedema, and breast masses.    Physical Exam  General:  Well-developed,well-nourished,in no acute distress; alert,appropriate and cooperative throughout examination Head:  Normocephalic and atraumatic without obvious abnormalities. No apparent alopecia or balding. Eyes:  pupils equal and pupils round.  pupils equal and pupils round.   Ears:  R ear normal and L ear normal.   Mouth:  Oral mucosa and oropharynx without lesions or exudates.  Teeth in good repair. Neck:  No deformities, masses, or tenderness noted. Lungs:  Normal respiratory effort, chest expands symmetrically. Lungs are  clear to auscultation, no crackles or wheezes. Heart:  normal rate, regular rhythm, and no murmur.   Abdomen:  soft and non-tender.     Impression & Recommendations:  Problem # 1:  HYPERTENSION (ICD-401.9)  BP today: 140/80 Prior BP: 146/74 (04/20/2010)  Prior 10 Yr Risk Heart Disease: [name] (04/30/2007)  Labs Reviewed: K+: 3.7 (07/16/2008) Creat: : 1.1 (07/16/2008)   Chol: 150 (01/28/2010)   HDL: 36.40 (01/28/2010)   LDL: 96 (01/28/2010)   TG: 89.0 (01/28/2010)  Problem # 2:  HYPERLIPIDEMIA (ICD-272.4)  His updated medication list for this problem includes:    Vytorin 10-40 Mg Tabs (Ezetimibe-simvastatin) .Marland Kitchen... 1/2tab every other day  Labs Reviewed: SGOT: 22 (01/28/2010)   SGPT: 19 (01/28/2010)  Lipid Goals: Chol Goal: 200 (04/30/2007)   HDL Goal: 40 (04/30/2007)   LDL Goal: 100 (04/30/2007)   TG Goal: 150 (04/30/2007)  Prior 10 Yr Risk Heart Disease: [name] (04/30/2007)   HDL:36.40 (01/28/2010), 39.40 (10/28/2009)  LDL:96 (01/28/2010), 103 (16/05/9603)  Chol:150 (01/28/2010), 215 (10/28/2009)  Trig:89.0 (01/28/2010), 178.0 (10/28/2009)  Problem # 3:  INGUINAL HERNIA, RIGHT (ICD-550.90) slight increase with strain watch lifting 3 days of NASIDS ice to site  Complete Medication List: 1)  Adult Aspirin Low Strength 81 Mg Tbdp (Aspirin) .Marland Kitchen.. 1 once daily 2)  Rapaflo 8 Mg Caps (Silodosin) .Marland Kitchen.. 1 cap once daily 3)  Vytorin 10-40 Mg Tabs (Ezetimibe-simvastatin) .... 1/2tab every other day 4)  Oxycodone-acetaminophen 5-325 Mg Tabs (Oxycodone-acetaminophen) .... One by mouth q 6 hours as needed pain 5)  Nitrostat 0.4 Mg Subl (Nitroglycerin) .... One sl as needed chest pain  every 5 min 6)  Axiron Pump  .... As needed 7)  Isosorbide Mononitrate Cr 30 Mg Xr24h-tab (Isosorbide mononitrate) .Marland Kitchen.. 1 tab once daily  Patient Instructions: 1)  Ice the site 2)  take Vimovo one two times a day for 3-5 days

## 2010-09-14 NOTE — Progress Notes (Signed)
Summary: c/o pull muscle or clot in leg  Phone Note Call from Patient Call back at Home Phone [phone]   Caller: Patient Reason for Call: Talk to Nurse Details for Reason: c/o pull muscle or clot in leg. no chestpain nor sob.  Initial call taken by: Lorne Skeens,  April 15, 2010 4:11 PM  Follow-up for Phone Call        Spoke with pt. Pt states he has been having some sorenes and swelling on left calf and swollen ankle  for the last 4 days. Pt. states he does not know if it is a pulled muscle, because he has been working on buildings. Pt. denies redness, warm or  tenderness to touch  on area. Pt. states the swelling goes away overnight. Pt. has a history of a blood clot in 1998. Pt. would like to know if he can be seen. I let pt. know will  send this message to the MD and his nurse.  Follow-up by: Ollen Gross, RN, BSN,  April 15, 2010 5:31 PM  Additional Follow-up for Phone Call Additional follow up Details #1::        OK per Dr. Juanda Chance to add the pt on tues 04/20/10 @ 12:15pm for a 12:30pm appt. I have left a message for the pt to call tues. morning. Sherri Rad, RN, BSN  April 16, 2010 6:32 PM  [facility] Katina Dung, RN, BSN  April 20, 2010 8:50 AM   Pt returning call [phone] Judie Grieve  April 20, 2010 9:59 AM--talked with pt--he states problem with leg  has almost completely resolved but he would lile to come to see Dr Juanda Chance today-     Appended Document: c/o pull muscle or clot in leg PT HAD  VEN DOPPLER DONE TODAY PER MISSY PT HAS SUPERFICIAL THROMBOSIS WHICH IS NEW SPOKE WITH DR Clifton James RE ABOVE DR MCALHANY CALLED DR COOPER PER DR COOPER  MAY APPLY WARM COMPRESSES TO AFFECTED SITE ,ELEVATE, AND TAKE TYL AS NEEDED FOR PAIN.PT INSTRUCTED OF ABOVE .

## 2010-09-14 NOTE — Assessment & Plan Note (Signed)
Summary: 6 month rov/njr   Vital Signs:  Patient profile:   70 year old male Height:      72 inches Weight:      216 pounds BMI:     29.40 Temp:     98.2 degrees F oral Pulse rate:   72 / minute Resp:     14 per minute BP sitting:   140 / 80  (left arm)  Vitals Entered By: Willy Eddy, LPN (August 04, 2009 8:10 AM) CC: roa labs, Hypertension Management   CC:  roa labs and Hypertension Management.  History of Present Illness: Severe myalgia from the vyotrin that stopped when held  the drug when he resummed the drug. The pt  has multiple risks and known CAD/PAD currently he is taking the vytorin every other day with some lowering but not at goals He has URI/ bronchitis symptoms with cough and  congestion fr 3-4 days He has a bite from a goat on his right hand with localized swelling and erythema.   Hypertension History:      He denies headache, chest pain, palpitations, dyspnea with exertion, orthopnea, PND, peripheral edema, visual symptoms, neurologic problems, syncope, and side effects from treatment.        Positive major cardiovascular risk factors include male age 44 years old or older, hyperlipidemia, hypertension, and family history for ischemic heart disease (males less than 15 years old).  Negative major cardiovascular risk factors include no history of diabetes and non-tobacco-user status.        Positive history for target organ damage include ASHD (either angina/prior MI/prior CABG) and peripheral vascular disease.  Further assessment for target organ damage reveals no history of stroke/TIA.     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 Once Daily 2)  Adult Aspirin Low Strength 81 Mg Tbdp (Aspirin) .Marland Kitchen.. 1 Once Daily  Current Medications (verified): 1)  Adult Aspirin Low Strength 81 Mg Tbdp (Aspirin) .Marland Kitchen.. 1 Once Daily 2)  Rapaflo 4 Mg Caps (Silodosin) .Marland Kitchen.. 1 Once Daily 3)  Amoxicillin-Pot Clavulanate 875-125 Mg Tabs (Amoxicillin-Pot Clavulanate) .... One By Mouth Two Times A Day For 10 Days 4)  Robitussin Cough/cold Cf 5-10-100 Mg/52ml Liqd (Phenylephrine-Dm-Gg) .... Two Tsp By Mouth Q 4 Hours 5)  Livalo 4 Mg Tabs (Pitavastatin Calcium) .... One By Mouth Monday and Friday Nights  Allergies (verified): 1)  ! Vioxx 2)  ! Morphine  Past History:  Family History: Last updated: 09/06/2007 Family History of CAD Male 1st degree relative <50 Family History High cholesterol Family History Hypertension  Social History: Last updated: 04/26/2007 Retired Married Former Smoker Alcohol use-no Drug use-no  Risk Factors: Smoking Status: quit (08/04/2009)  Past medical,  surgical, family and social histories (including risk factors) reviewed, and no changes noted (except as noted below).  Past Medical History: Reviewed history from 04/30/2007 and no changes required. Coronary artery disease Hyperlipidemia Osteoarthritis DVT, hx of GERD Hypertension  Past Surgical History: Reviewed history from 04/26/2007 and no changes required. 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 Family History of CAD Male 1st degree relative <50 Family History High cholesterol Family History Hypertension  Social History: Reviewed history from 04/26/2007 and no changes required. Retired Married Former Smoker Alcohol use-no Drug use-no  Review of Systems  The patient denies anorexia, fever, weight loss, weight gain, vision loss, decreased hearing, hoarseness, chest pain, syncope, dyspnea on exertion, peripheral edema, prolonged cough, headaches, hemoptysis, abdominal pain, melena, hematochezia, severe indigestion/heartburn, hematuria, incontinence, genital sores, muscle weakness, suspicious skin lesions, transient blindness, difficulty walking, depression, unusual weight change, abnormal bleeding, enlarged lymph nodes, angioedema, breast masses, and testicular masses.    Physical Exam  General:  well-developed.  alert and well-nourished.   Head:  normocephalic and atraumatic.   Eyes:  pupils equal and pupils round.  pupils equal and pupils round.   Ears:  R ear normal and L ear normal.   Mouth:  pharynx pink and moist.  no erythema.   Neck:  No deformities, masses, or tenderness noted. Lungs:  increased bronchial breath sound with decreased pealk flow Heart:  Normal rate and regular rhythm. S1 and S2 normal without gallop, murmur, click, rub or other extra sounds. Abdomen:  Bowel sounds positive,abdomen soft and non-tender without masses, organomegaly or hernias noted. Msk:  joint tenderness and joint swelling.   Extremities:  trace left pedal edema and trace right pedal edema.   Neurologic:  alert & oriented X3 and finger-to-nose normal.     Impression & Recommendations:  Problem # 1:  ACUTE BRONCHITIS (ICD-466.0)  Take antibiotics and other medications as directed. Encouraged to push clear liquids, get enough rest, and take acetaminophen as needed. To be seen in 5-7 days if no improvement, sooner if worse.  His  updated medication list for this problem includes:    Amoxicillin-pot Clavulanate 875-125 Mg Tabs (Amoxicillin-pot clavulanate) ..... One by mouth two times a day for 10 days    Robitussin Cough/cold Cf 5-10-100 Mg/48ml Liqd (Phenylephrine-dm-gg) .Marland Kitchen..Marland Kitchen Two tsp by mouth q 4 hours  Problem # 2:  ANIMAL BITE (ICD-919.8) TAKE THE AUGMENTIN AND KEEP THE SITE CLEAN AND DRY  Problem # 3:  HYPERLIPIDEMIA (ICD-272.4) HE COULD NOT TOLERATE THE VYTORIN DUE TO SEVERE MYALGIA The following medications were removed from the medication list:    Vytorin 10-40 Mg Tabs (Ezetimibe-simvastatin) ..... Once daily His updated medication list for this problem includes:    Livalo 4 Mg Tabs (Pitavastatin calcium) ..... One by mouth monday and friday nights WILL ATTEMPT PULSE STATIN THERAPY  Labs Reviewed: SGOT: 24 (07/28/2009)   SGPT: 23 (07/28/2009)  Lipid Goals: Chol Goal: 200 (04/30/2007)   HDL Goal: 40 (04/30/2007)   LDL Goal: 100 (04/30/2007)   TG Goal: 150 (04/30/2007)  Prior 10 Yr Risk Heart Disease: [name] (04/30/2007)   HDL:36.70 (07/28/2009), 32.20 (01/28/2009)  LDL:103 (07/28/2009), 65 (16/05/9603)  Chol:161 (07/28/2009), 115 (01/28/2009)  Trig:107.0 (07/28/2009), 91.0 (01/28/2009)  Complete Medication List: 1)  Adult Aspirin Low Strength 81 Mg Tbdp (Aspirin) .Marland Kitchen.. 1 once daily 2)  Rapaflo 4 Mg Caps (Silodosin) .Marland Kitchen.. 1 once daily 3)  Amoxicillin-pot Clavulanate 875-125 Mg Tabs (Amoxicillin-pot clavulanate) .... One  by mouth two times a day for 10 days 4)  Robitussin Cough/cold Cf 5-10-100 Mg/41ml Liqd (Phenylephrine-dm-gg) .... Two tsp by mouth q 4 hours 5)  Livalo 4 Mg Tabs (Pitavastatin calcium) .... One by mouth monday and friday nights  Hypertension Assessment/Plan:      The patient's hypertensive risk group is category C: Target organ damage and/or diabetes.  Today's blood pressure is 140/80.  His blood pressure goal is < 140/90.  Patient Instructions: 1)  Please schedule a follow-up appointment in  3 months. Prescriptions: AMOXICILLIN-POT CLAVULANATE 875-125 MG TABS (AMOXICILLIN-POT CLAVULANATE) one by mouth two times a day FOR 10 DAYS  #20 x 0   Entered and Authorized by:   Stacie Glaze MD   Signed by:   Stacie Glaze MD on 08/04/2009   Method used:   [facility] to        [facility] [phone] (retail)       [address]       [place], [place]  [address]       Ph: [phone]       Fax: [phone]   RxID:   [id number]

## 2010-09-14 NOTE — Miscellaneous (Signed)
Summary: Waiver of Liability and Checklist for [employer]  Waiver of Liability and Checklist for [employer]   Imported By: Maryln Gottron 02/06/2009 09:28:45  _____________________________________________________________________  External Attachment:    Type:   Image     Comment:   External Document

## 2010-09-14 NOTE — Procedures (Signed)
Summary: Colonoscopy Report/[facility]  Colonoscopy Report/[facility]   Imported By: Maryln Gottron 04/08/2010 14:35:56  _____________________________________________________________________  External Attachment:    Type:   Image     Comment:   External Document

## 2010-09-14 NOTE — Letter (Signed)
Summary: [facility]  [facility]   Imported By: Maryln Gottron 04/30/2009 15:04:01  _____________________________________________________________________  External Attachment:    Type:   Image     Comment:   External Document

## 2010-09-14 NOTE — Progress Notes (Signed)
Summary: labs and doppler studies  Phone Note Call from Patient   Caller: Patient Call For: Dr. [name] Reason for Call: Acute Illness Summary of Call: Pt is feeling better.  Would like to have labs and doppler studies. [phone] Initial call taken by: Lynann Beaver CMA,  July 18, 2008 4:56 PM  Follow-up for Phone Call        pt informed all labs are normal- doppler not back buty pt was told it was stable Follow-up by: Willy Eddy, LPN,  July 18, 2008 4:59 PM

## 2010-09-14 NOTE — Assessment & Plan Note (Signed)
Summary: ROV   Primary Provider:  Stacie Glaze MD  CC:  leg pain.  History of Present Illness: The patient is 70 years old and came in today for an unscheduled visit because of left calf pain. 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 His symptoms have been present for about 2 weeks and he had some associated swelling. His symptoms have mostly resolved over the last day or 2.  He had previous bypass surgery in 1990. He was recently admitted with chest pain and was found to have patent grafts and a patent stent in the circumflex artery. There was no obstructive coronary disease.  His other problems include peripheral vascular disease with bilateral indices of 0.85. He has seen Dr. Excell Seltzer in medical therapy has been recommended. He also has a history of hyperlipidemia.  Current Medications (verified): 1)  Adult Aspirin Low Strength 81 Mg Tbdp (Aspirin) .Marland Kitchen.. 1 Once Daily 2)  Rapaflo 8 Mg Caps (Silodosin) .Marland Kitchen.. 1 Cap Once Daily 3)  Vytorin 10-40 Mg Tabs (Ezetimibe-Simvastatin) .... 1/2tab Every Other Day 4)  Oxycodone-Acetaminophen 5-325 Mg Tabs (Oxycodone-Acetaminophen) .... One By Mouth Q 6 Hours As Needed Pain 5)  Nitrostat 0.4 Mg Subl (Nitroglycerin) .... One Sl As Needed Chest Pain  Every 5 Min 6)  Axiron Pump .... As Needed  Allergies (verified): 1)  ! Vioxx 2)  ! Morphine  Past History:  Past Medical History: Reviewed history from 08/11/2009 and no changes required. 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   Vital Signs:  Patient profile:   70 year old male Height:      72 inches Weight:      222 pounds BMI:     30.22 Pulse rate:   64 / minute Pulse rhythm:   regular BP sitting:   146 / 74  (left arm) Cuff size:   large  Vitals Entered By: Burnett Kanaris, CNA (April 20, 2010 4:44 PM)  Physical Exam  Additional Exam:  Gen. Well-nourished, in no distress   Neck: No JVD, thyroid not enlarged, no carotid bruits Lungs: No tachypnea, clear without rales, rhonchi or wheezes Cardiovascular: Rhythm regular, PMI not displaced,  heart sounds  normal, no murmurs or gallops, no peripheral edema, pulses normal in all 4 extremities. Abdomen: BS normal, abdomen soft and non-tender without masses or organomegaly, no hepatosplenomegaly. MS: No deformities, no cyanosis or clubbing   Neuro:  No focal sns   Skin:  no lesions    Impression & Recommendations:  Problem # 1:  DVT, HX OF (ICD-V12.51) He has a history of DVT and has recent symptoms of left calf pain and swelling. His exam today is pretty unremarkable with no swelling or tenderness. However on concerned about the possibility of recurrent DVT and will arrange for him to have Doppler studies done later this week or next week.  His updated medication list for this problem includes:    Adult Aspirin Low Strength 81 Mg Tbdp (Aspirin) .Marland Kitchen... 1 once daily  Orders: Venous Duplex Lower Extremity (Venous Duplex Lower)  His updated medication list for this problem includes:    Adult Aspirin Low Strength 81 Mg Tbdp (Aspirin) .Marland Kitchen... 1 once daily  Problem # 2:  CAD, ARTERY BYPASS  GRAFT (ICD-414.04) He had previous bypass surgery in 1990. His recent catheterization showed patent grafts and a patent stent with only nonobstructive CAD. His symptoms sounded pretty impressive with pain radiating to his arms relieved with nitroglycerin. It is possible that he could have had spasm. He has had a followup workup by GI which so far has been negative. We will continue his current therapy. His updated medication list for this problem includes:    Adult Aspirin Low Strength 81 Mg Tbdp (Aspirin) .Marland Kitchen... 1 once daily    Nitrostat 0.4 Mg Subl (Nitroglycerin) ..... One sl as needed chest pain  every 5 min    Isosorbide Mononitrate Cr 30 Mg Xr24h-tab (Isosorbide mononitrate) .Marland Kitchen... 1 tab once daily  Problem # 3:  HYPERTENSION (ICD-401.9) This is controlled on current medications. His updated medication list for this problem includes:    Adult Aspirin Low Strength 81 Mg Tbdp (Aspirin) .Marland Kitchen... 1 once daily  Patient Instructions: 1)  Your physician has requested that you have a lower extremity venous duplex.  This test is an ultrasound of the veins in the legs or arms.  It looks at venous blood flow that carries blood from the heart to the legs or arms.  Allow one hour for a Lower Venous exam.  There are no restrictions or special instructions. 2)  Your physician wants you to follow-up in: January with Dr Mariah Milling in [place]. [phone]  You will receive a reminder letter in the mail two months in advance. If you don't receive a letter, please call our office to schedule the follow-up appointment.

## 2010-09-14 NOTE — Assessment & Plan Note (Signed)
Summary: Left sided chest pain/dm   Vital Signs:  Patient Profile:   70 Years Old Male Height:     72 inches Temp:     98.7 degrees F oral Pulse rate:   80 / minute Resp:     14 per minute BP sitting:   124 / 80  (left arm)  Vitals Entered By: Willy Eddy, LPN (July 16, 2008 4:11 PM)                 Chief Complaint:  c/o diarrhea and nausea but no vomiting and increased heart rate- although apical pulse now is 80.  History of Present Illness: Diarrhea for 2 days aching and fever, 101 and had chest pain down arm and back, Deep breathing causes pain in the left side of the chest Loose stools more that 5 a days oththostatic Hx of CAD and TIA's on aggrenox    Current Allergies: ! VIOXX ! MORPHINE  Past Medical History:    Reviewed history from 04/30/2007 and no changes required:       Coronary artery disease       Hyperlipidemia       Osteoarthritis       DVT, hx of       GERD       Hypertension  Past Surgical History:    Reviewed history from 04/26/2007 and no changes required:       Coronary artery bypass graft       Total hip replacement   Family History:    Reviewed history from 09/06/2007 and no changes required:       Family History of CAD Male 1st degree relative <50       Family History High cholesterol       Family History Hypertension  Social History:    Reviewed history from 04/26/2007 and no changes required:       Retired       Married       Former Smoker       Alcohol use-no       Drug use-no    Review of Systems  The patient denies anorexia, fever, weight loss, weight gain, vision loss, decreased hearing, hoarseness, chest pain, syncope, dyspnea on exertion, peripheral edema, prolonged cough, headaches, hemoptysis, abdominal pain, melena, hematochezia, severe indigestion/heartburn, hematuria, incontinence, genital sores, muscle weakness, suspicious skin lesions, transient blindness, difficulty walking, depression, unusual weight  change, abnormal bleeding, enlarged lymph nodes, angioedema, and breast masses.     Physical Exam  General:     well-developed.  alert and well-nourished.   Head:     normocephalic.  atraumatic.   Nose:     no nasal discharge.  no mucosal pallor.   Mouth:     pharynx pink and moist.  no erythema.   Neck:     No deformities, masses, or tenderness noted. Lungs:     normal respiratory effort and no wheezes.   chest wall pain  Heart:     tachycardia and Grade  2 /6 systolic ejection murmur.  tachycardia and Grade   /6 systolic ejection murmur.   Abdomen:     soft, non-tender, and distended.      Impression & Recommendations:  Problem # 1:  CHEST PAIN, PLEURITIC (SJG-283.66) Assessment: New Reviewed EKG (see interpretation) and treatment options. Patient instructed to call for worsening pain, or new symptoms.   Problem # 2:  GASTROENTERITIS, VIRAL (ICD-008.8) Assessment: New with dehydration and will need oral  rehydration attempt if fails will need IVF and short term admission Orders: Venipuncture (99833) TLB-CBC Platelet - w/Differential (85025-CBCD)   Problem # 3:  CORONARY ARTERY DISEASE (ICD-414.00) Assessment: Unchanged  His updated medication list for this problem includes:    Aggrenox 25-200 Mg Xr12h-cap (Aspirin-dipyridamole) ..... One by mouth bid  Labs Reviewed: Chol: 122 (07/02/2008)   HDL: 34.1 (07/02/2008)   LDL: 67 (07/02/2008)   TG: 107 (07/02/2008)  Lipid Goals: Chol Goal: 200 (04/30/2007)   HDL Goal: 40 (04/30/2007)   LDL Goal: 100 (04/30/2007)   TG Goal: 150 (04/30/2007)   Problem # 4:  HYPERTENSION (ICD-401.9) now hypotensive BP today: 124/80  agfter standing 110.70 Prior BP: 114/76 (07/09/2008)  Prior 10 Yr Risk Heart Disease: [name] (04/30/2007)  Labs Reviewed: Creat: 1.0 (07/02/2008) Chol: 122 (07/02/2008)   HDL: 34.1 (07/02/2008)   LDL: 67 (07/02/2008)   TG: 107 (07/02/2008)  Orders: TLB-BMP (Basic Metabolic Panel-BMET) (80048-METABOL)    Complete Medication List: 1)  Vytorin 10-40 Mg Tabs (Ezetimibe-simvastatin) .... Once daily 2)  Prilosec 20 Mg Cpdr (Omeprazole) .Marland Kitchen.. 1in am as needed \\par  3)  Ciprofloxacin Hcl 500 Mg Tabs (Ciprofloxacin hcl) .... One by mouth two times a day  for 3 weeks 4)  Aggrenox 25-200 Mg Xr12h-cap (Aspirin-dipyridamole) .... One by mouth bid   Patient Instructions: 1)  rehydrate with  citrus flavored propell, apple juice and gingerale 2)  at leasts 2 oz every hour   ]  Appended Document: Orders Update    Clinical Lists Changes  Medications: Added new medication of ULTRAM 50 MG TABS (TRAMADOL HCL) 1 every 4-6 hours as needed pain - Signed Rx of ULTRAM 50 MG TABS (TRAMADOL HCL) 1 every 4-6 hours as needed pain;  #30 x 0;  Signed;  Entered by: Willy Eddy, LPN;  Authorized by: Stacie Glaze MD;  Method used: [facility] to [id number] [address], [place], [place], [place]  [address], Ph: [phone], Fax: [phone]    Prescriptions: Janean Sark 50 MG TABS (TRAMADOL HCL) 1 every 4-6 hours as needed pain  #30 x 0   Entered by:   Willy Eddy, LPN   Authorized by:   Stacie Glaze MD   Signed by:   Willy Eddy, LPN on 32/99/2426   Method used:   Electronically to        [facility] [phone]* (retail)       [address]       [place], [place]  [address]       Ph: [phone]       Fax: [phone]   RxID:   [id number]

## 2010-09-14 NOTE — Assessment & Plan Note (Signed)
Summary: 3 month rov/njr   Vital Signs:  Patient Profile:   70 Years Old Male Height:     72 inches Weight:      219 pounds Temp:     98.0 degrees F oral Pulse rate:   76 / minute BP sitting:   140 / 80  (left arm)  Vitals Entered By: Willy Eddy, LPN (May 09, 2008 10:50 AM)                 Chief Complaint:  roa labs--c/o left flank-thinks kidney    and Back pain.  History of Present Illness:  Back Pain      This is a 70 year old man who presents with Back pain.  The patient reports urinary retention, but denies fever and chills.  The pain is located in the right thoracic back.  The pain began gradually and after straining.  The pain radiates to the right flank.  The pain is made worse by activity.  The pain is made better by inactivity.    Hypertension History:      He denies headache, chest pain, palpitations, dyspnea with exertion, orthopnea, PND, peripheral edema, visual symptoms, neurologic problems, syncope, and side effects from treatment.        Positive major cardiovascular risk factors include male age 41 years old or older, hyperlipidemia, hypertension, and family history for ischemic heart disease (males less than 49 years old).  Negative major cardiovascular risk factors include no history of diabetes and non-tobacco-user status.        Positive history for target organ damage include ASHD (either angina; prior MI; prior CABG) and peripheral vascular disease.  Further assessment for target organ damage reveals no history of stroke/TIA.       Current Allergies: ! VIOXX ! MORPHINE  Past Medical History:    Reviewed history from 04/30/2007 and no changes required:       Coronary artery disease       Hyperlipidemia       Osteoarthritis       DVT, hx of       GERD       Hypertension  Past Surgical History:    Reviewed history from 04/26/2007 and no changes required:       Coronary artery bypass graft       Total hip replacement   Family  History:    Reviewed history from 09/06/2007 and no changes required:       Family History of CAD Male 1st degree relative <50       Family History High cholesterol       Family History Hypertension  Social History:    Reviewed history from 04/26/2007 and no changes required:       Retired       Married       Former Smoker       Alcohol use-no       Drug use-no     Physical Exam  General:     well-developed.  alert and well-nourished.   Head:     normocephalic.  atraumatic.   Eyes:     pupils equal and pupils round.  pupils equal and pupils round.   Nose:     no nasal discharge.  no mucosal pallor.   Mouth:     pharynx pink and moist.  no erythema.   Neck:     No deformities, masses, or tenderness noted. Lungs:     normal respiratory  effort and no wheezes.   Heart:     normal rate and regular rhythm.   Abdomen:     soft, non-tender, and distended.   Prostate:     no nodules, tender, and 1+ enlarged.   Msk:     SI joint tenderness and trigger point tenderness.   Extremities:     trace left pedal edema and trace right pedal edema.   Neurologic:     alert & oriented X3 and cranial nerves II-XII intact.      Impression & Recommendations:  Problem # 1:  Preventive Health Care (ICD-V70.0) Flu Vaccine Consent Questions     Do you have a history of severe allergic reactions to this vaccine? no    Any prior history of allergic reactions to egg and/or gelatin? no    Do you have a sensitivity to the preservative Thimersol? no    Do you have a past history of Guillan-Barre Syndrome? no    Do you currently have an acute febrile illness? no    Have you ever had a severe reaction to latex? no    Vaccine information given and explained to patient? yes    Are you currently pregnant? no    Lot Number:[id number]   Site Given  Left Deltoid IM   Reviewed preventive care protocols, scheduled due services, and updated immunizations.   Problem # 2:  HYPERTENSION (ICD-401.9)   BP today: 140/80 Prior BP: 110/70 (02/08/2008)  Prior 10 Yr Risk Heart Disease: [name] (04/30/2007)  Labs Reviewed: Creat: 1.0 (08/01/2007) Chol: 147 (04/29/2008)   HDL: 26.1 (04/29/2008)   LDL: 91 (04/29/2008)   TG: 150 (04/29/2008)   Problem # 3:  HYPERLIPIDEMIA (ICD-272.4) is only taking one half tablet due to side effects form the drug but numbersr did not sig change His updated medication list for this problem includes:    Vytorin 10-40 Mg Tabs (Ezetimibe-simvastatin) ..... Once daily  Labs Reviewed: Chol: 147 (04/29/2008)   HDL: 26.1 (04/29/2008)   LDL: 91 (04/29/2008)   TG: 150 (04/29/2008) SGOT: 26 (04/29/2008)   SGPT: 26 (04/29/2008)  Lipid Goals: Chol Goal: 200 (04/30/2007)   HDL Goal: 40 (04/30/2007)   LDL Goal: 100 (04/30/2007)   TG Goal: 150 (04/30/2007)  Prior 10 Yr Risk Heart Disease: [name] (04/30/2007)   Problem # 4:  CORONARY ARTERY DISEASE (ICD-414.00) I suspect PAD in addition ot the CAD that is known His updated medication list for this problem includes:    Bayer Low Strength 81 Mg Tbec (Aspirin) ..... 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 Once daily    Dolgic Plus 50-750-40 Mg Tabs (Butalbital-apap-caffeine) ..... One by mouth q 4-6  Orders: UA Dipstick w/o Micro (automated)  (81003) Radiology Referral (Radiology) Discussed use of medications, application of heat or cold, and exercises.   Complete Medication List: 1)  Vytorin 10-40 Mg Tabs (Ezetimibe-simvastatin) .... Once daily 2)  Bayer Low Strength 81 Mg Tbec (Aspirin) .... Once daily 3)  Prilosec 20 Mg Cpdr (Omeprazole) .Marland Kitchen.. 1in am 4)  Doxycycline Hyclate 100 Mg Caps (Doxycycline hyclate) .... One by mouth two times a day 5)  Dolgic Plus 50-750-40 Mg Tabs (Butalbital-apap-caffeine) .... One by mouth q 4-6   Other Orders: Flu Vaccine 62yrs + (62952) Administration Flu vaccine (W4132)  Hypertension Assessment/Plan:  The patient's hypertensive risk group is category C: Target organ damage and/or diabetes.  Today's blood pressure is 140/80.  His blood pressure goal is < 140/90.   Patient Instructions: 1)  Please schedule a follow-up appointment in 2 months.   Prescriptions: DOXYCYCLINE HYCLATE 100 MG CAPS (DOXYCYCLINE HYCLATE) one by mouth two times a day  #60 x 0   Entered and Authorized by:   Stacie Glaze MD   Signed by:   Stacie Glaze MD on 05/09/2008   Method used:   Electronically to        [facility] [phone]* (retail)       [address]       [place], [place]  [address]       Ph: [phone]       Fax: [phone]   RxID:   [id number]  ]  Appended Document: Orders Update    Clinical Lists Changes  Observations: Added new observation of COMMENTS: Wynona Canes, CMA  May 09, 2008 1:20 PM  (05/09/2008 13:20) Added new observation of PH URINE: 5.5  (05/09/2008 13:20) Added new observation of SPEC GR URIN: 1.010  (05/09/2008 13:20) Added new observation of APPEARANCE U: Clear  (05/09/2008 13:20) Added new observation of UA COLOR: yellow  (05/09/2008 13:20) Added new observation of WBC DIPSTK U: negative  (05/09/2008 13:20) Added new observation of NITRITE URN: negative  (05/09/2008 13:20) Added new observation of UROBILINOGEN: 0.2  (05/09/2008 13:20) Added new observation of PROTEIN, URN: negative  (05/09/2008 13:20) Added new observation of BLOOD UR DIP: 1+  (05/09/2008 13:20) Added new observation of KETONES URN: negative  (05/09/2008 13:20) Added new observation of BILIRUBIN UR: negative  (05/09/2008 13:20) Added new observation of GLUCOSE, URN: negative  (05/09/2008 13:20)      Laboratory Results   Urine Tests   Date/Time Reported: May 09, 2008 1:20 PM   Routine Urinalysis   Color: yellow Appearance: Clear  Glucose: negative   (Normal Range: Negative) Bilirubin: negative   (Normal Range: Negative) Ketone: negative   (Normal Range: Negative) Spec. Gravity: 1.010   (Normal Range: 1.003-1.035) Blood: 1+   (Normal Range: Negative) pH: 5.5   (Normal Range: 5.0-8.0) Protein: negative   (Normal Range: Negative) Urobilinogen: 0.2   (Normal Range: 0-1) Nitrite: negative   (Normal Range: Negative) Leukocyte Esterace: negative   (Normal Range: Negative)    Comments: Wynona Canes, CMA  May 09, 2008 1:20 PM

## 2010-09-14 NOTE — Letter (Signed)
Summary: Patient Medical Information  Patient Medical Information   Imported By: Kassie Mends 10/06/2009 09:37:12  _____________________________________________________________________  External Attachment:    Type:   Image     Comment:   External Document

## 2010-09-14 NOTE — Progress Notes (Signed)
Summary: dilated abdominal aorta on abdominal US   Phone Note Outgoing Call   Call placed by: Sherri Rad, RN, BSN,  May 04, 2010 2:53 PM Call placed to: Patient Summary of Call: I left a message for the pt to call. I have pulled the gallbladder US report that noted a slight diliation in the abdominal aorta. I have reviewed this with Dr. Juanda Chance- per Dr.Demetruis Depaul, he is not concerned with the finding. Sherri Rad, RN, BSN  May 04, 2010 2:54 PM   I spoke with the pt. He is aware of Dr. Regino Schultze impressions.  Initial call taken by: Sherri Rad, RN, BSN,  May 04, 2010 3:53 PM

## 2010-09-14 NOTE — Progress Notes (Signed)
Summary: sinus still & ear stopped up  Phone Note Call from Patient Call back at Home Phone [phone]   Caller: vm Summary of Call: Saw Dr. B last week Mon.  Still have sinus infection & left ear stopped up.  [facility]    Initial call taken by: Rudy Jew, RN,  June 14, 2010 9:33 AM  Follow-up for Phone Call        give biaxin 500 two times a day  and allegra d two times a day for 10 days- pt informed Follow-up by: Willy Eddy, LPN,  June 14, 2010 11:48 AM    New/Updated Medications: CLARITHROMYCIN 500 MG TABS (CLARITHROMYCIN) 1 two times a day for 10 days ALLEGRA-D ALLERGY & CONGESTION 60-120 MG XR12H-TAB (FEXOFENADINE-PSEUDOEPHEDRINE) 1 two times a day Prescriptions: ALLEGRA-D ALLERGY & CONGESTION 60-120 MG XR12H-TAB (FEXOFENADINE-PSEUDOEPHEDRINE) 1 two times a day  #20 x 0   Entered by:   Willy Eddy, LPN   Authorized by:   Stacie Glaze MD   Signed by:   Willy Eddy, LPN on 23/76/2831   Method used:   Electronically to        [facility] [phone]* (retail)       [address]       [place], [place]  [address]       Ph: [phone]       Fax: [phone]   RxID:   [id number] CLARITHROMYCIN 500 MG TABS (CLARITHROMYCIN) 1 two times a day for 10 days  #20 x 0   Entered by:   Willy Eddy, LPN   Authorized by:   Stacie Glaze MD   Signed by:   Willy Eddy, LPN on 38/05/1750   Method used:   Electronically to        [facility] [phone]* (retail)       [address]       [place], [place]  [address]       Ph: [phone]       Fax: [phone]   RxID:   [id number]   Appended Document: sinus still & ear stopped up all meds ordered by dr Tenny Craw

## 2010-09-14 NOTE — Letter (Signed)
Summary: [facility]  [facility]   Imported By: Maryln Gottron 05/03/2010 13:15:29  _____________________________________________________________________  External Attachment:    Type:   Image     Comment:   External Document

## 2010-09-14 NOTE — Letter (Signed)
Summary: [facility]  [facility] Provider: This provider was preselected by the workflow.  Signature: The signature status of this document was preset by the workflow  Processed by [employer] Local Indexer Client @ Thursday, August 06, 2009 10:52:32 AM using version:2010.1.2.11(2.4)   Manually Indexed By: [id number]  idlBatchDetail: [id number]   _____________________________________________________________________  External Attachment:    Type:   Image     Comment:   External Document

## 2010-09-14 NOTE — Consult Note (Signed)
Summary: [facility]  [facility]   Imported By: Maryln Gottron 03/15/2010 09:59:10  _____________________________________________________________________  External Attachment:    Type:   Image     Comment:   External Document

## 2010-09-14 NOTE — Assessment & Plan Note (Signed)
Summary: 3 month fup//ccm/pt rescd from [facility]//ccm   Vital Signs:  Patient profile:   70 year old male Height:      72 inches Weight:      222 pounds BMI:     30.22 Temp:     98.2 degrees F oral Pulse rate:   72 / minute Resp:     14 per minute BP sitting:   140 / 80  (left arm)  Vitals Entered By: Willy Eddy, LPN (February 08, 2010 10:26 AM) CC: roa labs, Hypertension Management   Primary Care Provider:  Stacie Glaze MD  CC:  roa labs and Hypertension Management.  History of Present Illness: the pt has been  to a urologist the pt has not had a colon and needs to have a screening procedure he has a flex about 10-12 years ago the pt has been on every other day vytorin with good results and no apparent myopathy no chest pains no abnormal SOB   Hypertension History:      He denies headache, chest pain, palpitations, dyspnea with exertion, orthopnea, PND, peripheral edema, visual symptoms, neurologic problems, syncope, and side effects from treatment.        Positive major cardiovascular risk factors include male age 66 years old or older, hyperlipidemia, hypertension, and family history for ischemic heart disease (males less than 66 years old).  Negative major cardiovascular risk factors include no history of diabetes and non-tobacco-user status.        Positive history for target organ damage include ASHD (either angina/prior MI/prior CABG) and peripheral vascular disease.  Further assessment for target organ damage reveals no history of stroke/TIA.     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.6) 12)  Psa, Increased  (ICD-790.93) 13)  Preventive Health Care  (ICD-V70.0) 14)  Occlusion&stenos Carotid Art w/o Mention Infarct  (ICD-433.10) 15)  Flank Pain, Left  (ICD-789.09) 16)  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) 17)  Benign Prostatic Hypertrophy, 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) 18)  Osteoarthritis  (ICD-715.90)  Current Problems (verified): 1)  Loc Osteoarthros Not Spec Prim/sec Lower Leg  (ICD-715.36) 2)  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) 3)  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) 4)  Cad, Artery Bypass Graft  (ICD-414.04) 5)  Hypertension  (ICD-401.9) 6)  Hyperlipidemia  (ICD-272.4) 7)  Dvt, Hx of  (ICD-V12.51) 8)  Gerd  (ICD-530.81) 9)  Family History of Cad Male 1st Degree Relative <50  (ICD-V17.3) 10)  Chest Pain, Pleuritic  (ICD-786.52) 11)  Dupuytren's Contracture, Right  (ICD-728.6) 12)  Psa, Increased  (ICD-790.93) 13)  Preventive Health Care  (ICD-V70.0) 14)  Occlusion&stenos Carotid Art w/o Mention Infarct  (ICD-433.10) 15)  Flank Pain, Left  (ICD-789.09) 16)  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 1 Once  Daily 2)  Rapaflo 4 Mg Caps (Silodosin) .Marland Kitchen.. 1 Once Daily 3)  Vytorin 10-40 Mg Tabs (Ezetimibe-Simvastatin) .... One Mwf 4)  Oxycodone-Acetaminophen 5-325 Mg Tabs (Oxycodone-Acetaminophen) .... One By Mouth Q 6 Hours As Needed Pain 5)  Nitrostat 0.4 Mg Subl (Nitroglycerin) .... One Sl As Needed Chest Pain  Every 5 Min 6)  Flexeril 10 Mg Tabs (Cyclobenzaprine Hcl) .... One By Mouth Three Times A Day As Needed Back Pain  Current Medications (verified): 1)  Adult Aspirin  Low Strength 81 Mg Tbdp (Aspirin) .Marland Kitchen.. 1 Once Daily 2)  Rapaflo 4 Mg Caps (Silodosin) .Marland Kitchen.. 1 Once Daily 3)  Vytorin 10-40 Mg Tabs (Ezetimibe-Simvastatin) .... One Mwf 4)  Oxycodone-Acetaminophen 5-325 Mg Tabs (Oxycodone-Acetaminophen) .... One By Mouth Q 6 Hours As Needed Pain 5)  Nitrostat 0.4 Mg Subl (Nitroglycerin) .... One Sl As Needed Chest Pain  Every 5 Min 6)  Axiron .... Pumps As Directed  Allergies (verified): 1)  ! Vioxx 2)  ! Morphine  Past History:  Family History: Last updated: Aug 28, 2009  Patients mother died with history of heart attack, stroke, and diabetes. Patients father had a stroke, and patient has one sister who  died from cancer.  He has a brother who had severe peripheral arterial   disease and required leg amputations.  He had diabetes.      Social History: Last updated: 2009/08/28  The patient lives with his wife. He has no intake of tobacco or alcohol. The patient previously worked with the [employer]  and also at [employer] full-time. However, he under total disability at this time secondary to his coronary problems.   Risk Factors: Smoking Status: quit (02/08/2010)  Past medical, surgical, family and social histories (including risk factors) reviewed, and no changes noted (except as noted below).  Past Medical History: Reviewed history from August 28, 2009 and no changes required. 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  HYPERTENSION (ICD-401.9) HYPERLIPIDEMIA (ICD-272.4) DVT, HX OF (ICD-V12.51) GERD (ICD-530.81) FAMILY HISTORY OF CAD MALE 1ST DEGREE RELATIVE <50 (ICD-V17.3) CHEST PAIN, PLEURITIC (ICD-786.52) ANIMAL BITE (ICD-919.8) ACUTE BRONCHITIS (ICD-466.0) DUPUYTREN'S CONTRACTURE, RIGHT (ICD-728.6) GASTROENTERITIS, VIRAL (ICD-008.8) PSA, INCREASED (ICD-790.93) PREVENTIVE HEALTH CARE  (ICD-V70.0) OCCLUSION&STENOS CAROTID ART W/O MENTION INFARCT (ICD-433.10) FLANK PAIN, LEFT (ICD-789.09) CHRONIC PROSTATITIS (ICD-601.1) BENIGN PROSTATIC HYPERTROPHY, WITH URINARY OBSTRUCTION (ICD-600.01) OSTEOARTHRITIS (ICD-715.90)  Past Surgical History: Reviewed history from 08-28-09 and no changes required.  PCI and  coronary bypass surgery.   bilateral hip replacements  Right knee arthroscopy x 2  bilateral blepharoplasty of the upper eyelids in 1993  ruptured disk of the lumbar spine in 1984  appendectomy  Family History: Reviewed history from 2009/08/28 and no changes required.  Patients mother died with history of heart attack, stroke, and diabetes. Patients father had a stroke, and patient has one sister who  died from cancer.  He has a brother who had severe peripheral arterial   disease and required leg amputations.  He had diabetes.      Social History: Reviewed history from 2009/08/28 and no changes required.  The patient lives with his wife. He has no intake of tobacco or alcohol. The patient previously worked with the [employer]  and also at [employer] full-time. However, he under total disability at this time secondary to his coronary problems.   Review of Systems  The patient denies anorexia, fever, weight loss, weight gain, vision loss, decreased hearing, hoarseness, chest pain,  syncope, dyspnea on exertion, peripheral edema, prolonged cough, headaches, hemoptysis, abdominal pain, melena, hematochezia, severe indigestion/heartburn, hematuria, incontinence, genital sores, muscle weakness, suspicious skin lesions, transient blindness, difficulty walking, depression, unusual weight change, abnormal bleeding, enlarged lymph nodes, angioedema, and breast masses.    Physical Exam  General:  well-developed.  alert and well-nourished.   Head:  normocephalic and atraumatic.   Eyes:  pupils equal and pupils round.  pupils equal and pupils round.   Ears:  R  ear normal and L ear normal.   Mouth:  pharynx pink and moist.  no erythema.   Neck:  No deformities, masses, or tenderness noted. Lungs:  increased bronchial breath sound with decreased pealk flow Heart:  Normal rate and regular rhythm. S1 and S2 normal without gallop, murmur, click, rub or other extra sounds. Abdomen:  Bowel sounds positive,abdomen soft and non-tender without masses, organomegaly or hernias noted.   Impression & Recommendations:  Problem # 1:  HYPERTENSION (ICD-401.9) Assessment Unchanged stable BP today: 140/80 Prior BP: 130/60 (11/05/2009)  Prior 10 Yr Risk Heart Disease: [name] (04/30/2007)  Labs Reviewed: K+: 3.7 (07/16/2008) Creat: : 1.1 (07/16/2008)   Chol: 150 (01/28/2010)   HDL: 36.40 (01/28/2010)   LDL: 96 (01/28/2010)   TG: 89.0 (01/28/2010)  Problem # 2:  HYPERLIPIDEMIA (ICD-272.4) Assessment: Unchanged tolerating and at goal will refill ad give samples His updated medication list for this problem includes:    Vytorin 10-40 Mg Tabs (Ezetimibe-simvastatin) ..... 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 1 once daily  Carotid Duplex Scan: Essentially stable, mild carotid disease, bilaterally 0-39% bilateral ICA stenosis (08/07/2008)  Complete Medication List: 1)  Adult Aspirin Low Strength 81 Mg Tbdp  (Aspirin) .Marland Kitchen.. 1 once daily 2)  Rapaflo 4 Mg Caps (Silodosin) .Marland Kitchen.. 1 once daily 3)  Vytorin 10-40 Mg Tabs (Ezetimibe-simvastatin) .... One mwf 4)  Oxycodone-acetaminophen 5-325 Mg Tabs (Oxycodone-acetaminophen) .... One by mouth q 6 hours as needed pain 5)  Nitrostat 0.4 Mg Subl (Nitroglycerin) .... One sl as needed chest pain  every 5 min 6)  Axiron  .... Pumps as directed  Other Orders: Gastroenterology Referral (GI)  Hypertension Assessment/Plan:      The patient's hypertensive risk group is category C: Target organ damage and/or diabetes.  Today's blood pressure is 140/80.  His blood pressure goal is < 140/90.  Patient Instructions: 1)  Please schedule a follow-up appointment in 4 months.

## 2010-09-14 NOTE — Letter (Signed)
Summary: [facility]  [facility]   Imported By: Maryln Gottron 01/20/2010 09:54:57  _____________________________________________________________________  External Attachment:    Type:   Image     Comment:   External Document

## 2010-09-16 ENCOUNTER — Telehealth: Payer: Self-pay | Admitting: *Deleted

## 2010-09-16 ENCOUNTER — Encounter: Payer: Self-pay | Admitting: Internal Medicine

## 2010-09-16 ENCOUNTER — Encounter: Payer: Self-pay | Admitting: *Deleted

## 2010-09-16 ENCOUNTER — Ambulatory Visit (INDEPENDENT_AMBULATORY_CARE_PROVIDER_SITE_OTHER): Payer: Medicare Other | Admitting: Internal Medicine

## 2010-09-16 VITALS — BP 144/80 | HR 76 | Temp 98.8°F | Resp 16 | Ht 72.0 in | Wt 218.0 lb

## 2010-09-16 DIAGNOSIS — R51 Headache: Secondary | ICD-10-CM

## 2010-09-16 DIAGNOSIS — I10 Essential (primary) hypertension: Secondary | ICD-10-CM

## 2010-09-16 DIAGNOSIS — J329 Chronic sinusitis, unspecified: Secondary | ICD-10-CM

## 2010-09-16 MED ORDER — MOXIFLOXACIN HCL 400 MG PO TABS: 400.0000 mg | ORAL_TABLET | Freq: Every day | ORAL | Status: DC

## 2010-09-16 MED ORDER — BROMPHENIRAMINE MALEATE 6 MG PO TB12: 1.0000 | ORAL_TABLET | Freq: Two times a day (BID) | ORAL | Status: DC

## 2010-09-16 MED ORDER — LEVOFLOXACIN 500 MG PO TABS: 500.0000 mg | ORAL_TABLET | Freq: Every day | ORAL | Status: AC

## 2010-09-16 NOTE — Telephone Encounter (Signed)
Appointment made and pt informed.

## 2010-09-16 NOTE — Patient Instructions (Signed)
  Sinusitis Sinuses are air pockets within the bones of your face. The growth of bacteria within a sinus leads to infection. Infection keeps the sinuses from draining. This infection is called sinusitis. SYMPTOMS There will be different areas of pain depending on which sinuses have become infected.  The maxillary sinuses often produce pain beneath the eyes.   Frontal sinusitis may cause pain in the middle of the forehead and above the eyes.  Other problems (symptoms) include:  Toothaches.   Colored, pus-like (purulent) drainage from the nose.   Any swelling, warmth, or tenderness over the sinus areas may be signs of infection.  TREATMENT Sinusitis is most often determined by an exam and you may have x-rays taken. If x-rays have been taken, make sure you obtain your results. Or find out how you are to obtain them. Your caregiver may give you medications (antibiotics). These are medications that will help kill the infection. You may also be given a medication (decongestant) that helps to reduce sinus swelling.  HOME CARE INSTRUCTIONS  Only take over-the-counter or prescription medicines for pain, discomfort, or fever as directed by your caregiver.   Drink extra fluids. Fluids help thin the mucus so your sinuses can drain more easily.   Applying either moist heat or ice packs to the sinus areas may help relieve discomfort.   Use saline nasal sprays to help moisten your sinuses. The sprays can be found at your local drugstore.  SEEK IMMEDIATE MEDICAL CARE IF YOU DEVELOP:  High fever that is still present after two days of antibiotic treatment.   Increasing pain, severe headaches, or toothache.   Nausea, vomiting, or drowsiness.   Unusual swelling around the face or trouble seeing.  MAKE SURE YOU:   Understand these instructions.   Will watch your condition.   Will get help right away if you are not doing well or get worse.  Document Released: 08/01/2005 Document Re-Released:  07/14/2008 [employer] Patient Information 2011 [employer].Place sinusitis patient instructions here.  

## 2010-09-16 NOTE — Progress Notes (Deleted)
  Subjective:    Patient ID: [name], male    DOB: 1940/10/25, 70 y.o.   MRN: [id number]  HPI    Review of Systems     Objective:   Physical Exam        Assessment & Plan:

## 2010-09-16 NOTE — Telephone Encounter (Signed)
We can see the patient at 1:30 today Sunday please place him on my schedule

## 2010-09-16 NOTE — Telephone Encounter (Signed)
Sore throat x 3-4 days, with sinus congestion, low grade fever.  Post nasal drainage, coughing.  Taking Allegra D with no relief.

## 2010-09-16 NOTE — Assessment & Plan Note (Signed)
Summary: 4 month rov/njr/pt rsc from bmp/cjr/rsc per dr/cjr   Vital Signs:  Patient profile:   70 year old male Height:      72 inches Weight:      216 pounds BMI:     29.40 Temp:     98.2 degrees F oral Pulse rate:   72 / minute Resp:     14 per minute BP sitting:   130 / 75  (left arm)  Vitals Entered By: Willy Eddy, LPN (July 26, 2010 9:10 AM) CC: roa - had injection in back with dr Ethelene Hal last week that helped, Hypertension Management Is Patient Diabetic? No   Primary Care Provider:  Stacie Glaze MD  CC:  roa - had injection in back with dr Ethelene Hal last week that helped and Hypertension Management.  History of Present Illness: the pt has acute pain and was seen in [place] [facility] with slight bulge in lumbar disc with radicular pain the pt has pain radiating to groin The pt has hx of AAA seen on xray the pt has had an injection by [facility] with pain control the pt reported intermitant chest pain, relief without medications, lasted 15 min no diaphoresis or radiation hx of cath in septemper GI work up in septemper  Hypertension History:      He denies headache, chest pain, palpitations, dyspnea with exertion, orthopnea, PND, peripheral edema, visual symptoms, neurologic problems, syncope, and side effects from treatment.        Positive major cardiovascular risk factors include male age 69 years old or older, hyperlipidemia, hypertension, and family history for ischemic heart disease (males less than 46 years old).  Negative major cardiovascular risk factors include no history of diabetes and non-tobacco-user status.        Positive history for target organ damage include ASHD (either angina/prior MI/prior CABG) and peripheral vascular disease.  Further assessment for target organ damage reveals no history of stroke/TIA.     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) 8)  Cad, Artery Bypass Graft  (ICD-414.04) 9)  Hypertension  (ICD-401.9) 10)  Hyperlipidemia  (ICD-272.4) 11)  Dvt, Hx of  (ICD-V12.51) 12)  Gerd  (ICD-530.81) 13)  Family History of Cad Male 1st Degree Relative <50  (ICD-V17.3) 14)  Chest Pain, Pleuritic  (ICD-786.52) 15)  Dupuytren's Contracture, Right  (ICD-728.6) 16)  Psa, Increased  (ICD-790.93) 17)  Preventive Health Care  (ICD-V70.0) 18)  Occlusion&stenos Carotid Art w/o Mention Infarct  (ICD-433.10) 19)  Flank Pain, Left  (ICD-789.09) 20)  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) 21)  Benign Prostatic Hypertrophy, 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) 22)  Osteoarthritis  (ICD-715.90)  Current Problems (verified): 1)  Sinusitis, Acute  (ICD-461.9) 2)  Otitis Media, Serous  (ICD-381.4) 3)  Inguinal Hernia, Right  (ICD-550.90) 4)  Edema  (ICD-782.3) 5)  Loc Osteoarthros Not Spec Prim/sec Lower Leg  (ICD-715.36) 6)  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) 7)  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) 8)  Cad, Artery Bypass Graft  (ICD-414.04) 9)  Hypertension  (ICD-401.9) 10)  Hyperlipidemia  (  ICD-272.4) 11)  Dvt, Hx of  (ICD-V12.51) 12)  Gerd  (ICD-530.81) 13)  Family History of Cad Male 1st Degree Relative <50  (ICD-V17.3) 14)  Chest Pain, Pleuritic  (ICD-786.52) 15)  Dupuytren's Contracture, Right  (ICD-728.6) 16)  Psa, Increased  (ICD-790.93) 17)  Preventive Health Care  (ICD-V70.0) 18)  Occlusion&stenos Carotid Art w/o Mention Infarct  (ICD-433.10) 19)  Flank Pain, Left  (ICD-789.09) 20)  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 1 Once Daily 2)  Rapaflo 8 Mg Caps (Silodosin) .Marland Kitchen.. 1 Cap Once Daily 3)  Vytorin 10-40 Mg Tabs (Ezetimibe-Simvastatin) .... 1/2tab Every Other Day 4)  Oxycodone-Acetaminophen 5-325 Mg Tabs (Oxycodone-Acetaminophen) .... One By Mouth Q 6 Hours As Needed Pain 5)  Nitrostat 0.4 Mg Subl (Nitroglycerin) .... One Sl As Needed Chest Pain  Every 5 Min 6)  Axiron Pump .... As Needed 7)  Isosorbide Mononitrate Cr 30 Mg Xr24h-Tab (Isosorbide Mononitrate) .Marland Kitchen.. 1 Tab Once Daily 8)  Clarithromycin 500 Mg Tabs (Clarithromycin) .Marland Kitchen.. 1 Two Times A Day For 10 Days 9)  Allegra-D Allergy & Congestion 60-120 Mg Xr12h-Tab (Fexofenadine-Pseudoephedrine) .Marland Kitchen.. 1 Two Times A Day  Current Medications (verified): 1)  Adult Aspirin Low Strength 81 Mg Tbdp (Aspirin) .Marland Kitchen.. 1 Once Daily 2)  Rapaflo 8 Mg Caps (Silodosin) .Marland Kitchen.. 1 Cap Once Daily 3)  Vytorin 10-40 Mg Tabs (Ezetimibe-Simvastatin) .... 1/2tab Every Other Day 4)  Oxycodone-Acetaminophen 5-325 Mg Tabs (Oxycodone-Acetaminophen) .... One By Mouth Q 6 Hours As Needed Pain 5)  Nitrostat 0.4 Mg Subl (Nitroglycerin) .... One Sl As Needed Chest Pain  Every 5 Min 6)  Axiron Pump .... As Needed 7)  Isosorbide Mononitrate Cr 30 Mg Xr24h-Tab (Isosorbide Mononitrate) .Marland Kitchen.. 1 Tab Once Daily  Allergies (verified): 1)  ! Vioxx 2)  ! Morphine  Past History:  Family History: Last updated: 2009-08-21  Patients mother died with history of heart attack, stroke, and diabetes. Patients father had a stroke, and patient has one sister who  died from cancer.  He has a brother who had severe peripheral arterial   disease and required leg amputations.  He had diabetes.      Social History: Last updated: 21-Aug-2009  The patient lives with his wife. He has no intake of tobacco or alcohol. The patient previously worked with the  [employer]  and also at [employer] full-time. However, he under total disability at this time secondary to his coronary problems.   Risk Factors: Smoking Status: quit (07/26/2010)  Past medical, surgical, family and social histories (including risk factors) reviewed, and no changes noted (except as noted below).  Past Medical History: Reviewed history from 08/21/2009 and no changes required. 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  HYPERTENSION (ICD-401.9) HYPERLIPIDEMIA (ICD-272.4) DVT, HX OF (ICD-V12.51) GERD (ICD-530.81) FAMILY HISTORY OF CAD MALE 1ST DEGREE RELATIVE <50 (ICD-V17.3) CHEST PAIN, PLEURITIC (ICD-786.52) ANIMAL BITE (ICD-919.8) ACUTE BRONCHITIS (ICD-466.0) DUPUYTREN'S CONTRACTURE, RIGHT (ICD-728.6) GASTROENTERITIS, VIRAL (ICD-008.8) PSA, INCREASED (ICD-790.93) PREVENTIVE HEALTH CARE (ICD-V70.0) OCCLUSION&STENOS CAROTID ART W/O MENTION INFARCT (ICD-433.10) FLANK PAIN, LEFT (ICD-789.09) CHRONIC PROSTATITIS (ICD-601.1) BENIGN PROSTATIC HYPERTROPHY, WITH URINARY OBSTRUCTION (ICD-600.01) OSTEOARTHRITIS (ICD-715.90)  Past Surgical History: Reviewed history from 21-Aug-2009 and no changes required.  PCI and  coronary bypass surgery.   bilateral hip replacements  Right knee arthroscopy x 2  bilateral blepharoplasty of the upper eyelids in 1993  ruptured disk of the lumbar spine in 1984  appendectomy  Family History: Reviewed history from 08/11/2009 and no changes required.  Patients mother died with history of heart attack, stroke, and diabetes. Patients father had a stroke, and patient has one sister who  died from cancer.  He has a brother who had severe peripheral arterial   disease and required leg amputations.  He had diabetes.      Social History: Reviewed history from 08/11/2009 and no changes required.  The patient  lives with his wife. He has no intake of tobacco or alcohol. The patient previously worked with the [employer]  and also at [employer] full-time. However, he under total disability at this time secondary to his coronary problems.   Review of Systems  The patient denies anorexia, fever, weight loss, weight gain, vision loss, decreased hearing, hoarseness, chest pain, syncope, dyspnea on exertion, peripheral edema, prolonged cough, headaches, hemoptysis, abdominal pain, melena, hematochezia, severe indigestion/heartburn, hematuria, incontinence, genital sores, muscle weakness, suspicious skin lesions, transient blindness, difficulty walking, depression, unusual weight change, abnormal bleeding, enlarged lymph nodes, angioedema, and breast masses.    Physical Exam  General:  Well-developed,well-nourished,in no acute distress; alert,appropriate and cooperative throughout examination Eyes:  pupils equal and pupils round.  pupils equal and pupils round.   Ears:  left serous effusion. Right TM normal Nose:  swollen turbinates with mild erythema bilaterally. Mouth:  Oral mucosa and oropharynx without lesions or exudates.  Teeth in good repair. Neck:  No deformities, masses, or tenderness noted. Lungs:  normal respiratory effort and no wheezes.   Heart:  normal rate and regular rhythm.   Abdomen:  soft and non-tender.     Impression & Recommendations:  Problem # 1:  CAD, ARTERY BYPASS GRAFT (ICD-414.04) Assessment Unchanged clear grafts seeing [facility] His updated medication list for this problem includes:    Adult Aspirin Low Strength 81 Mg Tbdp (Aspirin) .Marland Kitchen... 1 once daily    Nitrostat 0.4 Mg Subl (Nitroglycerin) ..... One sl as needed chest pain  every 5 min    Isosorbide Mononitrate Cr 30 Mg Xr24h-tab (Isosorbide mononitrate) .Marland Kitchen... 1 tab once daily  Labs Reviewed: Chol: 150 (01/28/2010)   HDL: 36.40 (01/28/2010)   LDL: 96 (01/28/2010)   TG: 89.0  (01/28/2010)  Lipid Goals: Chol Goal: 200 (04/30/2007)   HDL Goal: 40 (04/30/2007)   LDL Goal: 100 (04/30/2007)   TG Goal: 150 (04/30/2007)  Problem # 2:  CAROTID ARTERY STENOSIS (ICD-433.10) Assessment: Unchanged  His updated medication list for this problem includes:    Adult Aspirin Low Strength 81 Mg Tbdp (Aspirin) .Marland Kitchen... 1 once daily  Carotid Duplex Scan: Essentially stable, mild carotid disease, bilaterally 0-39% bilateral ICA stenosis (08/07/2008)  Problem # 3:  HYPERLIPIDEMIA (ICD-272.4) Assessment: Unchanged  His updated medication list for this problem includes:    Vytorin 10-40 Mg Tabs (Ezetimibe-simvastatin) .Marland Kitchen... 1/2tab every other day  Labs Reviewed: Chol: 150 (01/28/2010)   HDL: 36.40 (01/28/2010)   LDL: 96 (01/28/2010)   TG: 89.0 (01/28/2010)  Lipid Goals: Chol Goal: 200 (04/30/2007)   HDL Goal: 40 (04/30/2007)   LDL Goal: 100 (04/30/2007)   TG Goal: 150 (04/30/2007)  Labs Reviewed: SGOT: 22 (01/28/2010)   SGPT: 19 (01/28/2010)  Lipid Goals: Chol Goal: 200 (04/30/2007)   HDL Goal: 40 (04/30/2007)   LDL Goal: 100 (04/30/2007)   TG Goal: 150 (04/30/2007)  Prior 10 Yr Risk Heart  Disease: [name] (04/30/2007)   HDL:36.40 (01/28/2010), 39.40 (10/28/2009)  LDL:96 (01/28/2010), 103 (81/19/1478)  Chol:150 (01/28/2010), 215 (10/28/2009)  Trig:89.0 (01/28/2010), 178.0 (10/28/2009)  Problem # 4:  HYPERTENSION (ICD-401.9) Assessment: Improved good control BP today: 130/75 Prior BP: 140/72 (06/07/2010)  Prior 10 Yr Risk Heart Disease: [name] (04/30/2007)  Labs Reviewed: K+: 3.7 (07/16/2008) Creat: : 1.1 (07/16/2008)   Chol: 150 (01/28/2010)   HDL: 36.40 (01/28/2010)   LDL: 96 (01/28/2010)   TG: 89.0 (01/28/2010)  Problem # 5:  OSTEOARTHRITIS (ICD-715.90) back pain set off by large dog His updated medication list for this problem includes:    Adult Aspirin Low Strength 81 Mg Tbdp (Aspirin) .Marland Kitchen... 1 once daily    Oxycodone-acetaminophen 5-325 Mg Tabs  (Oxycodone-acetaminophen) ..... One by mouth q 6 hours as needed pain  Complete Medication List: 1)  Adult Aspirin Low Strength 81 Mg Tbdp (Aspirin) .Marland Kitchen.. 1 once daily 2)  Rapaflo 8 Mg Caps (Silodosin) .Marland Kitchen.. 1 cap once daily 3)  Vytorin 10-40 Mg Tabs (Ezetimibe-simvastatin) .... 1/2tab every other day 4)  Oxycodone-acetaminophen 5-325 Mg Tabs (Oxycodone-acetaminophen) .... One by mouth q 6 hours as needed pain 5)  Nitrostat 0.4 Mg Subl (Nitroglycerin) .... One sl as needed chest pain  every 5 min 6)  Axiron Pump  .... As needed 7)  Isosorbide Mononitrate Cr 30 Mg Xr24h-tab (Isosorbide mononitrate) .Marland Kitchen.. 1 tab once daily  Hypertension Assessment/Plan:      The patient's hypertensive risk group is category C: Target organ damage and/or diabetes.  Today's blood pressure is 130/75.  His blood pressure goal is < 140/90.  Patient Instructions: 1)  Please schedule a follow-up appointment in 3 months. 2)  Hepatic Panel prior to visit, ICD-9:995.20 3)  Lipid Panel prior to visit, ICD-9:272.4 4)  TSH prior to visit, ICD-9:272.4   Orders Added: 1)  Est. Patient Level IV [29562]

## 2010-09-16 NOTE — Progress Notes (Signed)
Addended by: Melchor Amour on: 09/16/2010 04:08 PM   Modules accepted: Orders

## 2010-09-16 NOTE — Progress Notes (Signed)
  Subjective:     [name] is a 70 y.o. male who presents for evaluation of sinus pain. Symptoms include: congestion, facial pain, fevers, frequent clearing of the throat, headaches, mouth breathing, nasal congestion and sore throat. Onset of symptoms was 7 days ago. Symptoms have been gradually worsening since that time. Past history is significant for COPD. Patient is a former smoker, quit 20 years ago.  The following portions of the patient's history were reviewed and updated as appropriate: allergies, current medications, past family history, past medical history, past social history, past surgical history and problem list.  Review of Systems A comprehensive review of systems was negative except for: Constitutional: positive for fatigue and malaise Ears, nose, mouth, throat, and face: positive for hoarseness, nasal congestion, sore throat and voice change Respiratory: positive for sputum and wheezing   Objective:    General appearance: alert, fatigued, flushed and mild distress Ears: normal TM's and external ear canals both ears Nose: Nares normal. Septum midline. Mucosa normal. No drainage or sinus tenderness., purulent discharge, severe congestion, turbinates swollen, inflamed, sinus tenderness bilateral, no crusting or bleeding points Throat: abnormal findings: moderate oropharyngeal erythema and posterior lymphoid hyperplasia Neck: no adenopathy, no carotid bruit, no JVD, supple, symmetrical, trachea midline and thyroid not enlarged, symmetric, no tenderness/mass/nodules Lungs: rhonchi bilaterally Chest wall: no tenderness Abdomen: soft, non-tender; bowel sounds normal; no masses,  no organomegaly Extremities: extremities normal, atraumatic, no cyanosis or edema    Assessment:    Acute bacterial sinusitis.    Plan:    Nasal saline sprays. Antihistamines per medication orders.  patient was placed on Avelox 400 mg by mouth daily for 10 days is instructed to use saline  lavage for 5 times a day and given a prescription for an antihistamine and decongestant.  Should his symptoms worsen we would obtain a CT of his sinuses and consider referral to ear. Nose and throat is instructed to avoid over-the-counter decongestants because of his hypertension his blood pressure was noted today to be elevated as well as having a headache which may be exacerbated by the use of over-the-counter decongestants. The

## 2010-09-23 ENCOUNTER — Encounter: Payer: Self-pay | Admitting: Cardiovascular Disease

## 2010-09-23 ENCOUNTER — Ambulatory Visit (INDEPENDENT_AMBULATORY_CARE_PROVIDER_SITE_OTHER): Payer: Medicare Other | Admitting: Cardiovascular Disease

## 2010-09-23 DIAGNOSIS — E785 Hyperlipidemia, unspecified: Secondary | ICD-10-CM

## 2010-09-23 DIAGNOSIS — I739 Peripheral vascular disease, unspecified: Secondary | ICD-10-CM

## 2010-09-23 DIAGNOSIS — I2510 Atherosclerotic heart disease of native coronary artery without angina pectoris: Secondary | ICD-10-CM

## 2010-09-30 NOTE — Assessment & Plan Note (Signed)
Summary: EC6/FORMER DR Juanda Chance PATIENT/SEPT 2011 [place]/SGC,CMA/AMD   Visit Type:  Initial Consult Primary Provider:  Stacie Glaze MD  CC:  Pt is getting over a cold. 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  He reports that he is doing well. He is active, but does not dissipate in an exercise plan secondary to his hips. He also reports his diet is poor. He is currently getting over bronchitis.  He takes Vytorin 10/40 one half pill every other day secondary to a history of muscle ache. He has not had any recent muscle ache. No chest pain, and no lower extremity edema, no near syncope or lightheadedness, no malaise or diaphoresis with exertion  His other problems include peripheral vascular disease with bilateral indices of 0.85. He has seen Dr. Excell Seltzer in medical therapy has been recommended.   EKG shows normal sinus rhythm with rate 80 beats per minute, left axis deviation, no significant ST or T wave changes  Current Medications (verified): 1)  Adult Aspirin Low Strength 81 Mg Tbdp (Aspirin) .Marland Kitchen.. 1 Once Daily 2)  Rapaflo 8 Mg Caps (Silodosin) .Marland Kitchen.. 1 Cap Once Daily 3)  Vytorin 10-40 Mg Tabs (Ezetimibe-Simvastatin) .... 1/2tab Every Other Day 4)  Nitrostat 0.4 Mg Subl (Nitroglycerin) .... One Sl As Needed Chest Pain  Every 5 Min 5)  Axiron Pump .... As Needed 6)  Prilosec 40 Mg Cpdr (Omeprazole) .Marland Kitchen.. 1 Tablet Q  Allergies (verified): 1)  ! Vioxx 2)  ! 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   bilateral hip replacements  Right knee arthroscopy x 2  bilateral blepharoplasty of the upper eyelids in 1993  ruptured disk of the lumbar spine in 1984  appendectomy  Family History: Last updated: 08-31-2009  Patients mother died with history of heart attack, stroke, and diabetes. Patients father had a stroke, and patient has one sister who  died from cancer.  He has a brother who had severe peripheral arterial   disease and required leg amputations.  He had diabetes.      Social History: Last updated: August 31, 2009  The patient lives with his wife. He has no intake of tobacco or alcohol. The patient previously worked with the [employer]  and also at [employer] full-time. However, he under total disability at this time secondary to his coronary problems.   Risk Factors: Smoking Status: quit (07/26/2010)  Review of Systems  The patient denies fever, weight loss, weight gain, vision loss, decreased hearing, hoarseness, chest pain, syncope, dyspnea on exertion, peripheral edema, prolonged cough, abdominal pain, incontinence, muscle weakness, depression, and enlarged lymph nodes.    Vital Signs:  Patient profile:   70 year old  male Height:      72 inches Weight:      217.75 pounds BMI:     29.64 Pulse rate:   80 / minute BP sitting:   130 / 82   (left arm) Cuff size:   large  Vitals Entered By: Lysbeth Galas CMA (September 23, 2010 11:07 AM)  Physical Exam  General:  Well developed, well nourished, in no acute distress. Head:  normocephalic and atraumatic Neck:  Neck supple, no JVD. No masses, thyromegaly or abnormal cervical nodes. Lungs:  Clear bilaterally to auscultation and percussion. Heart:  Non-displaced PMI, chest non-tender; regular rate and rhythm, S1, S2 without murmurs, rubs or gallops. Carotid upstroke normal, no bruit. Normal abdominal aortic size, no bruits. Pedals normal pulses. No edema, no varicosities. Abdomen:  Bowel sounds positive; abdomen soft and non-tender without masses,obese Msk:  Back normal, normal gait. Muscle strength and tone normal. Pulses:  pulses normal in all 4 extremities Extremities:  No clubbing or cyanosis. Neurologic:  Alert and oriented x 3. Skin:  Intact without lesions or rashes. Psych:  Normal affect.   Impression & Recommendations:  Problem # 1:  CAD, ARTERY BYPASS GRAFT (ICD-414.04) lungs history of coronary artery disease, remote bypass surgery, occluded LAD, PCI of the left circumflex with patent grafts by catheterization last year.  We have recommended continued medical management.  The following medications were removed from the medication list:    Isosorbide Mononitrate Cr 30 Mg Xr24h-tab (Isosorbide mononitrate) .Marland Kitchen... 1 tab once daily His updated medication list for this problem includes:    Adult Aspirin Low Strength 81 Mg Tbdp (Aspirin) .Marland Kitchen... 1 once daily    Nitrostat 0.4 Mg Subl (Nitroglycerin) ..... One sl as needed chest pain  every 5 min  Problem # 2:  HYPERLIPIDEMIA (ICD-272.4) LDL is above goal, last in the 90s in 2011. We have suggested he try to decrease his weight, watch his diet and he is scheduled to have his cholesterol checked next month. If LDL continues to be above goal, we could add additional zetia 5 mg a day, alternatively, add 1/2 pill of Vytorin  10/20 alternating with 1/2 pill of Vytorin 10/40.  His updated medication list for this problem includes:    Vytorin 10-40 Mg Tabs (Ezetimibe-simvastatin) .Marland Kitchen... 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 This is one more reason to be aggressive with his lipid management. Goal LDL is less than 70.  His updated medication list for this problem includes:    Adult Aspirin Low Strength 81 Mg Tbdp (Aspirin) .Marland Kitchen... 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 Recommendation for aggressive lipid management.  Problem # 5:  DVT, HX OF (ICD-V12.51) history of DVT. No recurrence noted on chart review.  His updated medication list for this problem includes:    Adult Aspirin Low Strength 81 Mg Tbdp (Aspirin) .Marland Kitchen... 1 once daily  Other Orders: EKG w/ Interpretation (93000)  Patient Instructions: 1)  Your physician recommends that you schedule a follow-up appointment in: 1 year 2)  Your physician recommends that you continue on your current medications as directed. Please refer to the Current Medication list given to you today.  Appended Document: EC6/FORMER DR Juanda Chance PATIENT/SEPT 2011 [place]/SGC,CMA/AMD Mild bilateral carotid arterial disease seen January 2011

## 2010-10-09 ENCOUNTER — Encounter: Payer: Self-pay | Admitting: Cardiovascular Disease

## 2010-10-15 ENCOUNTER — Other Ambulatory Visit: Payer: Self-pay | Admitting: *Deleted

## 2010-10-26 ENCOUNTER — Other Ambulatory Visit (INDEPENDENT_AMBULATORY_CARE_PROVIDER_SITE_OTHER): Payer: Medicare Other | Admitting: Internal Medicine

## 2010-10-26 DIAGNOSIS — E785 Hyperlipidemia, unspecified: Secondary | ICD-10-CM

## 2010-10-26 DIAGNOSIS — T887XXA Unspecified adverse effect of drug or medicament, initial encounter: Secondary | ICD-10-CM

## 2010-10-26 DIAGNOSIS — E039 Hypothyroidism, unspecified: Secondary | ICD-10-CM

## 2010-10-26 LAB — HEPATIC FUNCTION PANEL
ALT: 23 U/L (ref 0–53)
AST: 21 U/L (ref 0–37)
Albumin: 4.2 g/dL (ref 3.5–5.2)
Alkaline Phosphatase: 37 U/L — ABNORMAL LOW (ref 39–117)
Bilirubin, Direct: 0.1 mg/dL (ref 0.0–0.3)
Total Bilirubin: 0.7 mg/dL (ref 0.3–1.2)
Total Protein: 6.5 g/dL (ref 6.0–8.3)

## 2010-10-26 LAB — LIPID PANEL
Cholesterol: 182 mg/dL (ref 0–200)
HDL: 42.2 mg/dL (ref >39.00)
LDL Cholesterol: 119 mg/dL — ABNORMAL HIGH (ref 0–99)
Total CHOL/HDL Ratio: 4
Triglycerides: 104 mg/dL (ref 0.0–149.0)
VLDL: 20.8 mg/dL (ref 0.0–40.0)

## 2010-10-26 LAB — TSH: TSH: 1.84 u[IU]/mL (ref 0.35–5.50)

## 2010-10-28 ENCOUNTER — Other Ambulatory Visit: Payer: Self-pay

## 2010-10-29 LAB — BASIC METABOLIC PANEL
BUN: 9 mg/dL (ref 6–23)
CO2: 25 mEq/L (ref 19–32)
Calcium: 8.3 mg/dL — ABNORMAL LOW (ref 8.4–10.5)
Chloride: 105 mEq/L (ref 96–112)
Creatinine, Ser: 1.07 mg/dL (ref 0.4–1.5)
GFR calc Af Amer: >60 mL/min (ref >60)
GFR calc non Af Amer: >60 mL/min (ref >60)
Glucose, Bld: 116 mg/dL — ABNORMAL HIGH (ref 70–99)
Potassium: 4 mEq/L (ref 3.5–5.1)
Sodium: 138 mEq/L (ref 135–145)

## 2010-10-29 LAB — POCT I-STAT, CHEM 8
BUN: 15 mg/dL (ref 6–23)
Calcium, Ion: 1.02 mmol/L — ABNORMAL LOW (ref 1.12–1.32)
Chloride: 108 mEq/L (ref 96–112)
Creatinine, Ser: 1.2 mg/dL (ref 0.4–1.5)
Glucose, Bld: 107 mg/dL — ABNORMAL HIGH (ref 70–99)
HCT: 43 % (ref 39.0–52.0)
Hemoglobin: 14.6 g/dL (ref 13.0–17.0)
Potassium: 4.4 mEq/L (ref 3.5–5.1)
Sodium: 140 mEq/L (ref 135–145)
TCO2: 26 mmol/L (ref 0–100)

## 2010-10-29 LAB — CBC
HCT: 42.4 % (ref 39.0–52.0)
HCT: 42.9 % (ref 39.0–52.0)
Hemoglobin: 14.8 g/dL (ref 13.0–17.0)
Hemoglobin: 14.9 g/dL (ref 13.0–17.0)
MCH: 32.2 pg (ref 26.0–34.0)
MCH: 32.7 pg (ref 26.0–34.0)
MCHC: 34.5 g/dL (ref 30.0–36.0)
MCHC: 35.1 g/dL (ref 30.0–36.0)
MCV: 93.2 fL (ref 78.0–100.0)
MCV: 93.5 fL (ref 78.0–100.0)
Platelets: 158 10*3/uL (ref 150–400)
Platelets: 166 10*3/uL (ref 150–400)
RBC: 4.55 MIL/uL (ref 4.22–5.81)
RBC: 4.59 MIL/uL (ref 4.22–5.81)
RDW: 12.5 % (ref 11.5–15.5)
RDW: 12.5 % (ref 11.5–15.5)
WBC: 4.9 10*3/uL (ref 4.0–10.5)
WBC: 5.3 10*3/uL (ref 4.0–10.5)

## 2010-10-29 LAB — DIFFERENTIAL
Basophils Absolute: 0 10*3/uL (ref 0.0–0.1)
Basophils Relative: 1 % (ref 0–1)
Eosinophils Absolute: 0.3 10*3/uL (ref 0.0–0.7)
Eosinophils Relative: 5 % (ref 0–5)
Lymphocytes Relative: 24 % (ref 12–46)
Lymphs Abs: 1.3 10*3/uL (ref 0.7–4.0)
Monocytes Absolute: 0.5 10*3/uL (ref 0.1–1.0)
Monocytes Relative: 10 % (ref 3–12)
Neutro Abs: 3.2 10*3/uL (ref 1.7–7.7)
Neutrophils Relative %: 61 % (ref 43–77)

## 2010-10-29 LAB — PROTIME-INR
INR: 1 (ref 0.00–1.49)
Prothrombin Time: 13.4 seconds (ref 11.6–15.2)

## 2010-10-29 LAB — TSH: TSH: 2.332 u[IU]/mL (ref 0.350–4.500)

## 2010-10-29 LAB — COMPREHENSIVE METABOLIC PANEL
ALT: 21 U/L (ref 0–53)
AST: 22 U/L (ref 0–37)
Albumin: 3.8 g/dL (ref 3.5–5.2)
Alkaline Phosphatase: 41 U/L (ref 39–117)
BUN: 9 mg/dL (ref 6–23)
CO2: 28 mEq/L (ref 19–32)
Calcium: 8.7 mg/dL (ref 8.4–10.5)
Chloride: 107 mEq/L (ref 96–112)
Creatinine, Ser: 1.04 mg/dL (ref 0.4–1.5)
GFR calc Af Amer: >60 mL/min (ref >60)
GFR calc non Af Amer: >60 mL/min (ref >60)
Glucose, Bld: 97 mg/dL (ref 70–99)
Potassium: 3.9 mEq/L (ref 3.5–5.1)
Sodium: 138 mEq/L (ref 135–145)
Total Bilirubin: 0.8 mg/dL (ref 0.3–1.2)
Total Protein: 6.2 g/dL (ref 6.0–8.3)

## 2010-10-29 LAB — POCT CARDIAC MARKERS
CKMB, poc: <1 ng/mL — ABNORMAL LOW (ref 1.0–8.0)
Myoglobin, poc: 95.2 ng/mL (ref 12–200)
Troponin i, poc: <0.05 ng/mL (ref 0.00–0.09)

## 2010-10-29 LAB — LIPID PANEL
Cholesterol: 114 mg/dL (ref 0–200)
HDL: 32 mg/dL — ABNORMAL LOW (ref >39)
LDL Cholesterol: 59 mg/dL (ref 0–99)
Total CHOL/HDL Ratio: 3.6 RATIO
Triglycerides: 114 mg/dL (ref <150)
VLDL: 23 mg/dL (ref 0–40)

## 2010-10-29 LAB — CK TOTAL AND CKMB (NOT AT ARMC)
CK, MB: 2 ng/mL (ref 0.3–4.0)
Relative Index: INVALID (ref 0.0–2.5)
Total CK: 80 U/L (ref 7–232)

## 2010-10-29 LAB — CARDIAC PANEL(CRET KIN+CKTOT+MB+TROPI)
CK, MB: 1.9 ng/mL (ref 0.3–4.0)
CK, MB: 14.9 ng/mL (ref 0.3–4.0)
Relative Index: 1.8 (ref 0.0–2.5)
Relative Index: INVALID (ref 0.0–2.5)
Total CK: 67 U/L (ref 7–232)
Total CK: 833 U/L — ABNORMAL HIGH (ref 7–232)
Troponin I: 0.01 ng/mL (ref 0.00–0.06)
Troponin I: 0.01 ng/mL (ref 0.00–0.06)

## 2010-10-29 LAB — APTT: aPTT: 31 seconds (ref 24–37)

## 2010-10-29 LAB — TROPONIN I: Troponin I: 0.01 ng/mL (ref 0.00–0.06)

## 2010-10-29 LAB — D-DIMER, QUANTITATIVE (NOT AT ARMC): D-Dimer, Quant: <0.22 ug/mL-FEU (ref 0.00–0.48)

## 2010-11-04 ENCOUNTER — Ambulatory Visit (INDEPENDENT_AMBULATORY_CARE_PROVIDER_SITE_OTHER): Payer: Medicare Other | Admitting: Internal Medicine

## 2010-11-04 ENCOUNTER — Encounter: Payer: Self-pay | Admitting: Internal Medicine

## 2010-11-04 VITALS — BP 120/70 | HR 76 | Temp 98.5°F | Resp 14 | Ht 73.0 in | Wt 222.0 lb

## 2010-11-04 DIAGNOSIS — R42 Dizziness and giddiness: Secondary | ICD-10-CM

## 2010-11-04 DIAGNOSIS — M5126 Other intervertebral disc displacement, lumbar region: Secondary | ICD-10-CM

## 2010-11-04 DIAGNOSIS — R55 Syncope and collapse: Secondary | ICD-10-CM

## 2010-11-04 DIAGNOSIS — M5116 Intervertebral disc disorders with radiculopathy, lumbar region: Secondary | ICD-10-CM | POA: Insufficient documentation

## 2010-11-04 DIAGNOSIS — E785 Hyperlipidemia, unspecified: Secondary | ICD-10-CM

## 2010-11-04 MED ORDER — FISH OIL 1000 MG PO CAPS: 2.0000 | ORAL_CAPSULE | Freq: Two times a day (BID) | ORAL | Status: DC

## 2010-11-04 MED ORDER — EZETIMIBE-SIMVASTATIN 10-20 MG PO TABS: 1.0000 | ORAL_TABLET | Freq: Every day | ORAL | Status: DC

## 2010-11-04 MED ORDER — OXYCODONE-ACETAMINOPHEN 5-325 MG PO TABS: 1.0000 | ORAL_TABLET | ORAL | Status: DC | PRN

## 2010-11-04 NOTE — Assessment & Plan Note (Addendum)
I believe that the most likely cause of his positional dizziness may be his medication for his prostate rapiflow has a possible side effect listed of dizziness. There is a person of his dizziness it may be attributed to volume change from sitting to standing there is also a part of his dizziness it may be attributed to labyrinthitis he does have 4 beat nystagmus which can be reproduced by turning his head the first step in diagnosis we will hold the rapiflow for one week and see if the postural dizziness improves dramatically if it does we will try to find a alternative for this drug if the dizziness is not improved we'll resume the rapi flow and procedure alternative diagnoses

## 2010-11-04 NOTE — Progress Notes (Signed)
Subjective:    Patient ID: [name], male    DOB: Oct 09, 1940, 70 y.o.   MRN: [id number]  HPI  patient is a 70 year old white male with multiple medical problems his primary complaint today is that he has been dizzy at times he states that it is somewhat positional he arises from sitting it also occurs sometimes when he turns his head rapidly he did not notice it at rest nor does it when he lying down except when he turns over in bed he has not loss consciousness or fallen because of the dizziness but it has become more consistent and more troublesome to him.   recent medicine dictation changes include the use of rapid flow for his prostate and the adjustment of his Vytorin dose for his hyperlipidemia.   he is compliant with all of his medications currently he states rapiflow has helped him with his prostate and with his urine flow the Vytorin he has been taken on an every other day basis because he developed severe myalgias and the myalgias have since lessened 5 his cholesterol monitored today is not at goal for both total and LDL cholesterol   Review of Systems  Constitutional: Negative for fever and fatigue.  HENT: Negative for hearing loss, congestion, neck pain and postnasal drip.   Eyes: Negative for discharge, redness and visual disturbance.  Respiratory: Negative for cough, shortness of breath and wheezing.   Cardiovascular: Negative for leg swelling.  Gastrointestinal: Negative for abdominal pain, constipation and abdominal distention.  Genitourinary: Negative for urgency and frequency.  Musculoskeletal: Negative for joint swelling and arthralgias.  Skin: Negative for color change and rash.  Neurological: Negative for weakness and light-headedness.  Hematological: Negative for adenopathy.  Psychiatric/Behavioral: Negative for behavioral problems.   Past Medical History  Diagnosis Date  . HYPERLIPIDEMIA 04/26/2007  . HYPERTENSION 04/30/2007  . CAD, ARTERY BYPASS GRAFT  08/11/2009  . 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 GERD 04/30/2007  . INGUINAL HERNIA, RIGHT 05/26/2010  . BENIGN PROSTATIC HYPERTROPHY, WITH URINARY OBSTRUCTION 09/06/2007  . 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 LOC OSTEOARTHROS NOT SPEC PRIM/SEC LOWER LEG 11/05/2009  . OSTEOARTHRITIS 04/26/2007  . 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 Edema 04/20/2010  . PSA, INCREASED 07/09/2008  . DVT, HX OF 04/26/2007   Past Surgical History  Procedure Date  . Appendectomy   . Coronary artery bypass graft     3vessel  in 1990  . Cataract extraction, bilateral   . Joint replacement     both hips in 2002  . Spine surgery 1984    two ruptured discs/fusion  . Knee arthroscopy     x 2    reports that he quit smoking about 22 years ago. He does not have any smokeless tobacco history on file. He reports that he does not drink alcohol or use illicit drugs. family history includes Cancer in his sister; Diabetes in his brother and mother; Heart attack in his mother; Heart disease in his father; and Stroke in his father and mother. Allergies  Allergen Reactions  . Morphine   . Rofecoxib        Objective:   Physical Exam  Constitutional: He is oriented to person, place, and time. He appears well-developed and well-nourished.        No orthostatic blood pressure changes are detected  HENT:  Head: Normocephalic and atraumatic.  Eyes: Conjunctivae are normal. Pupils are equal, round, and reactive to light.  Neck: Normal range of motion. Neck supple.  Cardiovascular: Normal rate and regular rhythm.   Pulmonary/Chest: Effort normal and breath sounds normal.  Abdominal: Soft. Bowel sounds are normal.  Musculoskeletal: Normal range of motion.  Neurological: He is alert and oriented to person, place, and time.        3 beat nystagmus detected to the left          Assessment & Plan:   I spent over 45 minutes directly with this patient  reviewing his symptomology related to his syncopal and presyncopal episodes I believe it is multifactorial maybe related to both his blood pressure medications his new prostate medications and even possibly his cholesterol medications as well as the potential that his peripheral vascular disease plays a role.   I have referred him to a neurosurgeon for his chronic back pain a copy of his MRI should be forwarded to the neurosurgeon as well   we have changed to Vytorin to 10/20  We will hold his prostate medication for 2 weeks to see if the dizziness improves if not we'll proceed with a full syncopal workup

## 2010-11-04 NOTE — Patient Instructions (Signed)
Stop the rapiflow for one week if the dizziness improves dramatically contact our office and continue discontinuation of this medicine if the dizziness does not change he may resume this medication and contact our office for continuing the workup

## 2010-11-04 NOTE — Assessment & Plan Note (Signed)
Monitoring his lipids today he is  Over goals for total and LDLc he has not been eating well and has been on every other day on the vytorin. Will change the vytorin to 10/20 daily. Failed lipitor and crestor due to pain. He has responded best to vytorin  Will add omega 3 to his current  meds

## 2010-11-04 NOTE — Assessment & Plan Note (Signed)
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 Modesta Messing 4 epidural injections these have no longer controlled his pain he wishes evaluation by a neurosurgeon for possible surgery or alternative therapies. We will refill his medicines today.

## 2010-11-15 ENCOUNTER — Telehealth: Payer: Self-pay | Admitting: *Deleted

## 2010-11-15 NOTE — Telephone Encounter (Signed)
Per dr [name]- no labs are needs Left message on machine For pt

## 2010-11-15 NOTE — Telephone Encounter (Signed)
Pt feels better off Rapiflo.  Dizziness gone, and needs to know in 2 months when he returns what labs he should have?

## 2010-11-23 ENCOUNTER — Ambulatory Visit (INDEPENDENT_AMBULATORY_CARE_PROVIDER_SITE_OTHER): Payer: Medicare Other | Admitting: *Deleted

## 2010-11-23 ENCOUNTER — Other Ambulatory Visit: Payer: Self-pay | Admitting: Cardiology

## 2010-11-23 DIAGNOSIS — I711 Thoracic aortic aneurysm, ruptured, unspecified: Secondary | ICD-10-CM

## 2010-11-23 DIAGNOSIS — I714 Abdominal aortic aneurysm, without rupture, unspecified: Secondary | ICD-10-CM

## 2010-11-25 ENCOUNTER — Encounter: Payer: Self-pay | Admitting: Internal Medicine

## 2010-12-23 HISTORY — PX: CYSTOSCOPY: SUR368

## 2010-12-28 NOTE — Assessment & Plan Note (Signed)
[facility]                            CARDIOLOGY OFFICE NOTE   NAME:[name], [name]                    MRN:          [id number]  DATE:08/27/2007                            DOB:          [birth year]    PRIMARY CARE PHYSICIAN:  [name].   CLINICAL HISTORY:  [name] is 70 years old and returned for  followup with his recent catheterization.  He had bypass surgery in 1990  and has a bare metal stent in circumflex artery which was placed in  2002.  He recently had a stress test which showed some mild chest  tightness, PVCs and ST changes although no perfusion defects.  Because  of these changes, we brought him in for evaluation with angiography.  This showed both his grafts were patent and a stent was patent and there  was no evidence of obstructive disease.   A few days following his cath, he developed pain in his right groin.  Came in for a duplex scan which showed no false aneurysm.  His symptoms  have subsequently cleared.   PAST MEDICAL HISTORY:  Significant for bilateral carotid stenosis.  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   His current medications include aspirin, Plavix, Vytorin, Zegerid and  Prilosec.   Review of systems is positive for some swelling in his hands, which he  notices mostly in the morning and wonders if it is related to his  Plavix.   On examination today the blood pressure was 118/68 and pulse 70 and  regular.  There was no venous distension.  The carotid pulses were full with short  bruits.  CHEST:  Was clear.  HEART:  Rhythm was regular.  No murmurs or gallops.  ABDOMEN:  Was soft with normal bowel sounds.  There is no  hepatosplenomegaly.  Right femoral artery site was soft.  There is a very small knot.  Peripheral pulses full.  No peripheral edema.   IMPRESSION:  1. Coronary artery disease status post coronary bypass surgery  1990      and status post stenting of circumflex artery 2002 with patent      grafts and patent stent site by recent cath.  2. Good left ventricular function with small inferior scar.  3. Hyperlipidemia.  4. History of deep vein thrombophlebitis.  5. Degenerative joint disease.  6. Swelling of the hands   RECOMMENDATIONS:  I think [name] is doing well.  His chief  complaint is related to swelling in his hands when he wakes up in the  morning and he wonders if related to Plavix.  Plavix can cause  angioedema though this does not sound typical for that.  In order to try  and clarify this situation will plan to stop his Plavix for 2 weeks and  see if his symptoms clear.  His symptoms do clear, I think he could get  by with aspirin alone since he has a bare metal stent some time he had  is a stent put in.  If his  symptoms did not improve off Plavix I  recommend him go back on Plavix.  He is to see [name] in about a  month and I will see him back in cardiac followup in the year.     [name] Elvera Lennox Juanda Chance, MD, [facility]  Electronically Signed    BRB/MedQ  DD: 08/27/2007  DT: 08/27/2007  Job #: [id number]

## 2010-12-28 NOTE — Progress Notes (Signed)
[facility]                        PERIPHERAL VASCULAR OFFICE NOTE   NAME:[name], [name]                    MRN:          [id number]  DATE:09/05/2008                            DOB:          02-04-1941    REASON FOR CONSULT:  Lower extremity PAD with intermittent claudication.   HISTORY OF PRESENT ILLNESS:  Mr. Dettman is a 70 year old gentleman  with a long history of coronary artery disease.  He has had both PCI and  coronary bypass surgery.  He was seen by Dr. Juanda Chance earlier in a month  and complained of leg pain.  He underwent a vascular evaluation with  ultrasound and ABIs.  His right leg ABI was 0.85, left was 0.92.  There  were increased velocities in the superficial femoral arteries  bilaterally.  He was referred for further evaluation.   The patient has a lot of problems with leg pain.  He has had multiple  orthopedic procedures including bilateral hip replacements and also has  bilateral knee problems, worse on the right.  He had a torn meniscus  from an injury earlier this year.  He also describes calf pain with  walking.  It feels like an ache.  It occurs along with his other leg  pains.  He denies rest pain.  He has had no ischemic ulcerations or skin  problems.   MEDICATIONS:  1. Vytorin 10/40 mg daily.  2. Multivitamin 1 daily.  3. Aspirin 81 mg daily.  4. Cipro 500 mg b.i.d..   PAST MEDICAL HISTORY:  1. Coronary artery disease status post CABG.  2. Hyperlipidemia.  3. Osteoarthritis with bilateral hip replacements.  4. History of DVT.   SOCIAL HISTORY:  The patient is retired.  He was a Radio producer for 32 years.  He is married.  He is a former smoker and  does not drink alcohol.   FAMILY HISTORY:  He has a brother who had severe peripheral arterial  disease and required leg amputations.  He had diabetes.   REVIEW OF SYSTEMS:  A 12-point review of systems was performed.  The  patient feels well and is  active.  He plays golf regularly.  Other than  his joint pains and leg pain as detailed in the HPI, he really has no  other complaints.   PHYSICAL EXAMINATION:  GENERAL:  The patient is alert and oriented, very  nice gentleman.  No acute distress.  VITAL SIGNS:  Weight is 221 pounds, blood pressure is 120/70 on the  right and 130/76 on left, heart rate 72, respiratory rate is 12.  HEENT:  Normal.  NECK:  Normal carotid upstrokes, no bruits.  JVP normal.  No thyromegaly  or thyroid nodules.  LUNGS:  Clear bilaterally.  HEART:  Regular rate and rhythm.  No murmurs or gallops.  ABDOMEN:  Soft, nontender.  No bruits.  No organomegaly.  BACK:  No CVA tenderness.  EXTREMITIES:  Femoral pulses are 2+.  His pedal pulses are diminished  bilaterally.  PTs are not palpable.  DPs are both 1+.  SKIN:  Warm and dry.  There is no rash.  There is no clubbing, cyanosis,  or edema of the legs.  NEUROLOGIC:  Cranial nerves II through XII are intact.  Strength is  intact and equal.   ASSESSMENT:  This is a 70 year old gentleman with lower extremity  peripheral arterial disease.  His velocities by duplex ultrasound are  increased, but just at the cut off from moderate-to-severe stenosis.  His symptoms and ABIs are both mild.  In my experience, velocity  measures in the range of 250 cm per second such as Mr. Goza's  correspond to fairly mild superficial femoral artery disease at the time  of arteriography.  I think it is unlikely that he would be helped much  by invasive angiography because he is not particularly limited by  claudication symptoms at present.  I do think he would benefit from  followup ABIs in a year with duplex scanning to make sure that he does  not have progressive disease.  He will contact me in the interim if his  symptoms worsen.  I would like to see him back in clinical follow-up in  1 year as well.  He should continue with secondary risk reduction  measures under the care of  Dr. Juanda Chance and Dr. [name].     Veverly Fells. Excell Seltzer, MD  Electronically Signed    MDC/MedQ  DD: 09/05/2008  DT: 09/06/2008  Job #: [id number]   cc:   Everardo Beals. Juanda Chance, MD, [facility]  Stacie Glaze, MD

## 2010-12-28 NOTE — Assessment & Plan Note (Signed)
[facility]                            CARDIOLOGY OFFICE NOTE   NAME:[name]                    MRN:          [id number]  DATE:07/25/2007                            DOB:          02/20/1941    PRIMARY CARE PHYSICIAN:  [name], M.D.   CLINICAL HISTORY:  [name] is 70 years old and returns for  management of his coronary heart disease.  He had bypass surgery in 1990  and then had a bare metal stent placed to the native circumflex artery  in May, 2002.  He has a LIMA to the LAD and a vein graft with  connections to two diagonal branches of the LAD.  LV function has been  good.   He has been doing quite well from his heart and has had no recent chest  pain, shortness of breath, or palpitations.   His past medical history is significant for bilateral carotid stenosis.  He also has a feeling of hearing his pulse in his head.  He also has a  history of deep venous thrombophlebitis and degenerative joint disease  in his hips and knees.   His current medications include aspirin, Plavix, Vytorin, Zegerid, and  Prilosec.   He is intolerant to BETA BLOCKERS.   PHYSICAL EXAMINATION:  Blood pressure is 131/73, pulse 70 and regular.  There is no venous distention.  The carotid pulses are full with short  bruits.  CHEST:  Clear without rales or rhonchi.  Cardiac rhythm was regular.  I could hear no murmurs or gallops.  ABDOMEN:  Soft with normal bowel sounds.  There is no  hepatosplenomegaly.  Peripheral pulses were somewhat difficult to feel.  There is no peripheral edema.   An electrocardiogram showed left axis deviation.   IMPRESSION:  1. Coronary artery disease, status post coronary artery bypass graft      surgery in 1990 and stenting of the circumflex artery in 2002 with      coronary anatomy as described above.  2. Good left ventricular function.  3. Hyperlipidemia.  4. History of deep venous thrombophlebitis.  5.  Degenerative joint disease of the lumbar spine, hips, and knees.   RECOMMENDATIONS:  I think [name] is doing well.  He is 18 years  post bypass surgery, and I think we should evaluate him further with a  Myoview scan.  We will plan to do a __________ and Myoview scan.  Dr.  Lovell [name] has done his laboratory work, and his cholesterol  count is good except that he has a low HDL.  I will plan to see him back  in cardiac followup in a year or sooner if his scan is abnormal.     [name] R. Juanda Chance, MD, [facility]  Electronically Signed    BRB/MedQ  DD: 07/25/2007  DT: 07/26/2007  Job #: [id number]

## 2010-12-28 NOTE — Cardiovascular Report (Signed)
NAMEORVAN, PAPADAKIS             ACCOUNT NO.:  [id number]   MEDICAL RECORD NO.:  [id number]          PATIENT TYPE:  OIB   LOCATION:  1965                         FACILITY:  [facility]   PHYSICIAN:  Bruce R. Juanda Chance, MD, FACCDATE OF BIRTH:  [birth year]   DATE OF PROCEDURE:  08/02/2007  DATE OF DISCHARGE:  08/02/2007                            CARDIAC CATHETERIZATION   CLINICAL HISTORY:  Mr. Dhawan is 70 years old.  He had bypass surgery  in 1990 and has subsequently had stenting of the proximal portion of a  dominant circumflex artery.  He had a stress test performed yesterday  for as a screening procedure and developed a millimeter ST depression  and some chest tightness and PVCs although there was no perfusion defect  on the scan.  Because of these changes we brought him in for a  catheterization today.   PROCEDURE:  The procedure was performed by the right femoral artery  using an arterial sheath and 4-French preformed coronary catheters.  A  front wall arterial puncture was performed and Omnipaque contrast was  used.  The 3-D right catheter was used for injection of the LIMA graft.  A left bypass graft catheter was used for injection of the vein graft to  diagonal branches.  The patient tolerated the procedure well and left  the laboratory in satisfactory condition.   RESULTS:  The left main coronary artery has 30% diffuse narrowing.   The left anterior descending was completed occluded at its origin.   The circumflex artery was a dominant vessel, gave rise to a small  marginal branch, a larger marginal branch, two posterolateral branches  and a posterior descending branch.  There was 30% narrowing right at the  proximal edge of the stent in the proximal right coronary artery.  There  was 70% ostial stenosis of the second marginal branch which arose from  the stent.  This vessel was free of significant disease.   The right coronary artery was a nondominant vessel despite a  conus  branch and two right ventricular branches.  There were tandem 50%  stenoses in the mid portion of the vessel.   The saphenous vein graft to the first and second diagonal branches of  the LAD was patent and functioned normally.  The second diagonal branch  also filled the LAD by retrograde flow.   The LIMA graft to the LAD was patent and functioned normally.  The  distal vessel is free of significant disease and supplied two more  diagonal branches.  There was 70% narrowing proximal to the LIMA  insertion before the diagonal branch.   The left ventriculogram performed in the RAO projection showed  hypokinesis of the anterolateral wall and hypokinesis of the inferobasal  wall.  The estimated fraction was 50%.   The aortic pressure was 127/64 with mean of 89.  Left ventricular  pressure was 127/16.   CONCLUSION:  1. Coronary artery disease status post prior bypass surgery and prior      percutaneous coronary interventions.  2. Severe native vessel disease with 30% narrowing in the left main  coronary artery, total occlusion of the left anterior descending      artery, 30% narrowing at the proximal edge of the stent in the      proximal circumflex artery, and 70% ostial stenosis in the second      marginal branch of the circumflex artery and 50% narrowing in a      nondominant right coronary artery.  3. Patent sequential vein graft to the first and second diagonal      branches of the left anterior descending artery with no significant      obstruction.  4. Patent left internal mammary artery graft to the left anterior      descending artery with no significant obstruction.  5. Anterolateral wall hypokinesis and inferobasal wall hypokinesis      with an estimated ejection fraction of 50%.   RECOMMENDATIONS:  Both grafts are widely patent and the stent is patent  and there does not appear to be any progression of disease.  Will plan  reassurance and continued secondary  risk factor modification.      Bruce Elvera Lennox Juanda Chance, MD, [facility]  Electronically Signed     BRB/MEDQ  D:  08/02/2007  T:  08/03/2007  Job:  [id number]   cc:   Cardiopulmonary Lab  Stacie Glaze, MD

## 2010-12-28 NOTE — Op Note (Signed)
[name], [name]             ACCOUNT NO.:  [id number]   MEDICAL RECORD NO.:  [id number]          PATIENT TYPE:  AMB   LOCATION:  [facility]                         FACILITY:  [facility]   PHYSICIAN:  Ollen Gross, M.D.    DATE OF BIRTH:  [birth year]   DATE OF PROCEDURE:  01/23/2008  DATE OF DISCHARGE:                               OPERATIVE REPORT   PREOPERATIVE DIAGNOSIS:  Right knee lateral meniscus tear.   POSTOPERATIVE DIAGNOSIS:  Right knee lateral meniscus tear, plus medial  meniscal tear.   PROCEDURE:  Right knee arthroscopy with medial and lateral meniscal  debridement.   SURGEON:  Ollen Gross, M.D.  No assistant.   ANESTHESIA:  Knee block plus sedation.   ESTIMATED BLOOD LOSS:  Minimal.   DRAINS:  None.   COMPLICATIONS:  None.   CONDITION:  Stable to recovery.   BRIEF CLINICAL NOTE:  Mr. Heesch is a 70 year old male with a several  month history of progressively worsening lateral knee pain and  mechanical symptoms. He did have a good response to an injection. Exam  and history suggested a lateral meniscal tear confirmed by MRI. He  presents now for arthroscopy and debridement.   PROCEDURE IN DETAIL:  After successful administration of knee block and  then sedation, a tourniquet was placed high on his right thigh, and his  right lower extremity was prepped and draped in the usual sterile  fashion. Then, superomedial and inferolateral incisions were made and  the flow cannula passed superomedial and camera passed inferolateral.  Arthroscopic visualization proceeded. The undersurface of the patella  had some grade 2 and 3 change but no exposed bone. The trochlea also  with some grade 2 and 3 change but no exposed bone. Medial and lateral  gutters were visualized. There were no loose bodies. Flexion and valgus  were applied to the knee, and the medial compartment was entered. He has  evidence of an old prior partial meniscectomy. The meniscal remnant,  however, has  a horizontal cleavage split from the body through the  posterior horn. The spinal needle was used to localized the inferomedial  portal. Small incision made, dilator placed, and then I used a  combination of baskets and a 4.2-mm shaver to debride this down to more  stable, and then I sealed this off the ArthroCare device to eliminate  any splits in the meniscus. The chondral surfaces on the medial side  showed a relatively normal appearance on the tibia, but on the femur,  there is evidence of an old chondral defect of the medial femoral  condyle which it was about 1 x 2 cm but is not demonstrating exposed  bone. The intercondylar notch was visualized. The ACL looked normal.  Lateral compartment was entered, and he has severe tear in the body of  the lateral meniscus. This was debrided back to a stable base at the  baskets and the 4.2-mm shaver and sealed off with the ArthroCare device.  There was some chondromalacia of the lateral compartment but no evidence  of any significant areas of exposed bone. There was a tiny area but  nothing of tremendous significance. The joints were again inspected and  no further tears or loose bodies or chondral defects. The arthroscopic  equipment was then removed from inferior portals which were closed  with an interrupted 4-0 nylon. Twenty mL of 0.25% Marcaine with  epinephrine injected into an inflow cannula, and then that was removed  and that portal closed with nylon. Bulky sterile dressing was applied,  and the patient was awakened and transported to recovery in stable  condition.      Ollen Gross, M.D.  Electronically Signed     FA/MEDQ  D:  01/23/2008  T:  01/23/2008  Job:  [id number]

## 2010-12-28 NOTE — Assessment & Plan Note (Signed)
[facility]                            CARDIOLOGY OFFICE NOTE   NAME:Salman, CLEE PANDIT                    MRN:          [id number]  DATE:08/01/2007                            DOB:          [birth year]    Mr. Schellhase had a stress test today, Myoview scan, and he developed  some mild chest tightness and PVCs and 1-mm lateral ST depression. His  scan did not show any definite ischemia. Because of the abnormal  findings, we saw him today as an add on.   He indicated he has not had any recent angina.   PHYSICAL EXAMINATION:  On examination, blood pressure is 114/75, pulse  95 and regular.  There was no venous distention. The carotid pulses were full.  CARDIAC: Rhythm was regular. There were no murmurs or gallops.  CHEST: Was clear without rales or rhonchi.   IMPRESSION:  Coronary artery disease status post coronary artery bypass  graft surgery in 1970 and status post stenting of the circumflex artery  in 2002 with abnormal Myoview scan.   RECOMMENDATIONS:  Will plan to arrange for Mr. Boyte to come in  tomorrow for catheterization in the [facility] lab. His last catheterization in  2002 showed a patent LIMA which was slightly atrophic to the LAD and a  patent vein graft with connections to first and second diagonal branch.  He had a dominant circ which was stented proximally and he had a small  non-dominant right with 80% stenosis and a normal ventricle.     Bruce Elvera Lennox Juanda Chance, MD, [facility]  Electronically Signed    BRB/MedQ  DD: 08/01/2007  DT: 08/01/2007  Job #: [id number]

## 2010-12-28 NOTE — Assessment & Plan Note (Signed)
[name]                            CARDIOLOGY OFFICE NOTE   NAME:[name], [name] BODI                    MRN:          [id number]  DATE:08/19/2008                            DOB:          06/07/1941    PRIMARY PHYSICIAN:  [name] Glaze, MD   CLINICAL HISTORY:  [name] is a 70 years old returned for followup  management of his coronary heart disease.  He had bypass surgery in 1990  and subsequently had a bare metal stent placed in the circumflex artery.  He underwent catheterization in late December 2008, and the stent was  patent and his grafts were patent at that time.   He has done fairly well from his standpoint of his heart with no recent  chest pain or shortness of breath.   He does say that he has pain in both his legs when he walks, he has  mostly in the calves.  He also has some degenerative disease in his  right knee and in both hips.   His past medical history is significant for bilateral carotid disease,  he had recent Doppler studies that showed 0-39% stenosis bilaterally.  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   CURRENT MEDICATIONS:  Vytorin 10/40, Zegerid, Prilosec, and Aggrenox.  He stopped the Plavix last June due to some intolerance.   PHYSICAL EXAMINATION:  The blood pressure was 140/70, the pulse 88, and  regular.  There was no venous tension.  The carotid pulses were full  with short bilateral bruits.  Chest was clear without rales or rhonchi.  The heart rhythm was regular.  There are no murmurs or gallops.  The  abdomen was soft, normal bowel sounds.  Peripheral pulses were full with  no peripheral edema.  The extremities showed trace edema.  There were  decreased pedal pulses in the right foot, which were questionably  possible  pulses of the left foot.   Electrocardiogram showed sinus rhythm with occasional PVCs and  nonspecific ST-T changes, and  left axis deviation.   IMPRESSION:  1. Coronary artery disease status post coronary bypass graft surgery      in 1990 and stenting of circumflex artery in 2002, with      nonobstructive disease and last cath December 2008.  2. Good left ventricular function.  3. Hyperlipidemia.  4. History of deep vein thrombophlebitis following surgery.  5. Degenerative joint disease.  6. Bilateral calf pain with walking possible claudication.   RECOMMENDATIONS:  I think Mr. [name] is doing well from standpoint of  his heart.  I think his leg pain could be claudication, but I am not  sure.  We will plan the Valium with peripheral  arterial Doppler studies and then decide further evaluation as needed.  Otherwise I will see him back in followup in the ER.     Bruce Elvera Lennox Juanda Chance, MD, [facility]  Electronically Signed    BRB/MedQ  DD: 08/19/2008  DT: 08/20/2008  Job #: [id number]

## 2010-12-31 NOTE — Discharge Summary (Signed)
[place]. [facility]  Patient:    JOJO, PEHL                    MRN: [id number] Adm. Date:  [id number] Disc. Date: [id number] Attending:  Cherylynn Ridges CC:         Everardo Beals Juanda Chance, M.D. [facility]   Discharge Summary  DISCHARGE DIAGNOSES: 1. Ruptured retrocecal acute appendicitis. 2. Coronary artery disease.  CONSULTANTS:  He is also followed by Bruce R. Juanda Chance, M.D., and he was seen by cardiology while in the hospital.  DIET:  Regular.  CONDITION ON DISCHARGE:  Stable.  DISCHARGE MEDICATIONS:  He will be discharged to home on Cipro and Flagyl.  He will get approximately five days of each.  ACTIVITY:  As tolerated.  HISTORY OF PRESENT ILLNESS:  The patient is a 70 year old gentleman admitted with abdominal pain of several days duration.  This was actually the seventh day of pain, which had worsened over time.  He came in with nausea, vomiting, and abdominal pain.  There was suspicion that he had acute appendicitis on examination.  A CT was not done.  He was taken to the OR for a laparoscopic appendectomy.  HOSPITAL COURSE:  Preoperative cardiology clearance was done.  He went to surgery and had a ruptured retrocecal appendix with pretty much the center portion of the appendix completely disintergrated by an infection, however, we were able to take out the entire appendix laparoscopically.  He had some purulent drainage in the right lower quadrant, but most of the time of the surgery was taken up making sure that the entire appendix was removed.  Postoperatively, after the first postoperative day, he had no fevers.  His maximum temperature in the last 24 hours was 99.9 degrees.  His other vital signs are stable.  He is eating well and passing gas.  He has not had a bowel movement, but has excellent bowel sounds and his wounds look well with no evidence of infection.  He will return to see me in approximately a week and a half on April 25, 2000.  He will be discharged to home on Cipro and Flagyl. DD:  04/13/00 TD:  04/13/00 Job: [id number]

## 2010-12-31 NOTE — Discharge Summary (Signed)
[place] [place] [employer]. [facility]  Patient:    [name], [name]                    MRN: [id number] Adm. Date:  [id number] Disc. Date: [id number] Attending:  Glennon Hamilton Dictator:   Lavella Hammock, P.A. CC:         Stacie Glaze, M.D. [facility]   Referring Physician Discharge [place]  DATE OF BIRTH:  [birth year]  PROCEDURES: 1. Cardiac catheterization. 2. Coronary arteriogram. 3. Left ventriculogram. 4. Percutaneous transluminal coronary angioplasty and stent of one vessel.  HOSPITAL COURSE:  [name] is a 70 year old male with a history of bypass surgery in 1990 with a LIMA to LAD and an SVG to diagonal and to the ramus intermedius.  On his last catheterization in December 2001 his grafts were patent.  He had been doing well until the day of admission when he developed "indigestion" and took nitroglycerin.  He started driving to the hospital but stopped and called EMS and was brought to [facility] emergency room.  His pain was relieved in about 45 minutes with nitroglycerin.  He was admitted to rule out MI and for further evaluation.  His enzymes were positive for MI with a CK-MB initially of 101/8 and a troponin of 0.44.  He was scheduled for a cardiac catheterization and anticoagulated.  He had a cardiac catheterization on November 30, 2000 which showed a left main with a 40% stenosis and LAD that was totaled.  The circumflex had a 70% proximal and 40% mid stenosis.  The RCA was nondominant and had an 80% stenosis.  The SVG to diagonal and intermediate was patent.  The LIMA to LAD was partially atrophic.  Left ventricular function was normal with an EF of about 55%.  He had a stent to the circumflex which reduced the stenosis from 70% to less than 10%.  He tolerated the procedure well and the sheath was removed without difficulty.  The next day his groin was stable and he was feeling much better.  He was ambulating without difficulty.  He had Plavix added to his  medication regimen and was considered stable for discharge on December 01, 2000.  LABORATORY DATA:  Chest x-ray showed left basilar scarring with no acute disease.  Laboratory values:  Hemoglobin 14.3, hematocrit 41.0, wbcs 6.2, platelets 228.  Sodium 139, potassium 3.6, chloride 105, CO2 28, BUN 11, creatinine 0.9, glucose 105.  CK-MB #1 101/8.0, #2 151/13.8, #3 130/10.8.  DISCHARGE CONDITION:  Improved.  CONSULTS:  None.  COMPLICATIONS:  None.  DISCHARGE DIAGNOSES: 1. Acute myocardial infarction, status post percutaneous transluminal coronary    angioplasty and stent to the circumflex this admission. 2. Status post aortocoronary bypass surgery in 1990 with left internal mammary    artery to left anterior descending artery and saphenous vein graft to    first diagonal and ramus intermedius. 3. Preserved left ventricular function. 4. History of deep vein thrombosis. 5. Hyperlipidemia. 6. Status post bilateral hip replacements. 7. Status post appendectomy. 8. Remote history of tobacco use.  DISCHARGE INSTRUCTIONS:  ACTIVITY:  His activity level is to include no driving for a week and no sexual or strenuous activity until cleared by M.D.  DIET:  He is to stick to a low fat diet.  WOUND CARE:  He is to call the office for bleeding, swelling, or drainage at the catheterization site.  FOLLOW-UP:  He has an appointment with the P.A. for Dr. Juanda Chance on Friday,  May 3 at 10:15 a.m.  He is to follow up with Dr. [name] if needed.  DISCHARGE MEDICATIONS: 1. Plavix 75 mg q.d. 2. Pravachol 20 mg q.d. 3. Coated aspirin 81 mg q.d. 4. Zyrtec 10 mg q.d. 5. Nitroglycerin 0.4 mg sublingual p.r.n. DD:  12/01/00 TD:  12/01/00 Job: [id number]

## 2010-12-31 NOTE — Op Note (Signed)
[name]. [facility]  Patient:    [name], [name]                    MRN: [id number] Proc. Date: 04/10/00 Adm. Date:  [id number] Attending:  Cherylynn Ridges                           Operative Report  PREOPERATIVE DIAGNOSIS:  Acute appendicitis.  POSTOPERATIVE DIAGNOSIS:  Acute appendicitis with rupture.  OPERATION:  Laparoscopic appendectomy.  SURGEON:  Jimmye Norman, M.D.  ASSISTANT:  None.  ANESTHESIA:  General endotracheal.  ESTIMATED BLOOD LOSS:  75-100 cc.  COMPLICATIONS:  Ruptured appendix.  CONDITION:  Stable.  FLUIDS:  Crystalloid 2 L.  BLEEDING:  Minimal.  SPECIMEN:  Appendix in fragments.  INDICATIONS FOR SURGERY:  The patient is a 70 year old gentleman who is status post cardiac bypass and right hip replacement, who now comes in for a laparoscopic appendectomy because of abdominal pain localized to the right lower quadrant with an increased white count.  FINDINGS:  The patient had a retrocecal ruptured appendix with no abscess cavity.  It ruptured into the mesentery of the appendix and was blown out in the middle of the appendix itself.  DESCRIPTION OF PROCEDURE:  The patient was taken to the operating room and placed on the table in the supine position.  After an adequate general anesthetic was administered, he was prepped and draped in the usual sterile manner exposing the midline and the upper quadrant of the abdomen.  A supraumbilical curvilinear incision was made down to the midline fascia through which a Veress needle was passed into the peritoneal cavity and confirmed to be in adequate position using the saline test.  Once this was done, carbon dioxide insufflation was instilled into the peritoneal cavity up to a maximum intra-abdominal pressure of 14 mmHg.  Subsequently, the patient was placed into Trendelenburg position.  A right upper quadrant 5 mm cannula was passed into the peritoneal cavity under direct vision.  We  were able to mobilize the cecum and see that the appendix was inflamed and posteriorly attached to the back wall of the cecum.  A 11/12 mm cannula and trocar was passed through the suprapubic area into the peritoneal cavity under direct vision.  With both in place, we were able to dissect out the the appendix.  The appendix was mobilized at the base of the cecum initially and then, subsequently, transected there using a standard 3.5 mm GIA stapler.  Once this was done, we were able to mobilize the mesoappendix, but this was what took the bulk of the operation, mobilizing it and making sure that none of the appendix was left behind.  It was noted that the appendix was ruptured in its midportion and we were able to get the tip and the midportion completely off of the back wall of the cecum, but this took tedious dissection with blunt and sharp dissection and also with electrocautery.  We were able to eventually mobilize the appendix completely and the mesoappendix, stapled across it using a vascular stapler 2.5 mm Endo- GIA stapler.  We then were able to remove the appendix through the suprapubic area using an EndoCatch bag without contaminating the subcu.  Once this was done, we replaced the cannula and then irrigated out this area with a large amount of saline.  We inspected the appendix after it was out to make sure all of it was removed  and there was some question whether a midportion was left in place.  We went back in and dissected out several pieces of what appeared a appendages epiploicae from the back wall of the cecum, however, there was no evidence of retained appendix.  We irrigated with 4 L of warm saline solution.  There was no bleeding at the time of removal of all cannulas which were removed after irrigation.  We then closed the suprapubic and the supraumbilical fascia using a figure-of-eight stitch of 0 Vicryl and the skin was closed using a running subcuticular stitch of  5-0 Vicryl.  Marcaine 0.25% was injected at all skin sites and a sterile dressing was applied. DD:  04/10/00 TD:  04/11/00 Job: [id number]

## 2010-12-31 NOTE — Cardiovascular Report (Signed)
[place]. [facility]  Patient:    [name], [name]                    MRN: [id number] Proc. Date: 11/30/00 Adm. Date:  [id number] Disc. Date: [id number] Attending:  Glennon Hamilton CC:         Stacie Glaze, M.D. [facility]  Cardiac Catheterization Lab   Cardiac Catheterization  CLINICAL HISTORY:  Mr. Nack is 70 years old and had bypass surgery in 1990.  He was last studied 4 months ago, at which time, he had patent grafts. He was admitted this time with chest pain, and his troponins were positive for a non-ST-segment elevation infarction.  DESCRIPTION OF PROCEDURE:  The procedure was performed via the right femoral artery using an arterial sheath and 6-French preformed coronary catheters.  A front wall arterial puncture was performed.  Omnipaque contrast was used.  A LIMA catheter was used for injection of the LIMA graft, and a JL4 diagnostic catheter was used for injection of the vein graft.  At the completion of the diagnostic study, we were uncertain whether the lesion in the proximal native circumflex was tight enough to warrant intervention.  We decided to perform an IVUS run.  This was performed with an Atlantis catheter with automatic pullback.  This demonstrated a minimal lumen diameter of 1.75 x 2.0 at the lesion site with the rest of the vessel at 4.0 x 4.0.  For this reason, we decided to intervene on the lesion.  The patient had been given weight-adjusted heparin to prolong an ACT greater than 200 seconds, and after the IVUS run was given, double bolus Integrilin and infusion.  We used a 4.0 BX Velocity because of a marginal side branch.  We deployed this with 1 inflation of 10 atmospheres for 30 seconds.  We then post dilated with a 4.0- x 15-mm [place] Monorail touching both edges of the stent with 2 inflations of 15 atmospheres for 30 seconds and 13 atmospheres for 30 seconds.  The marginal branch was compromised at its ostium, but the flow  remained good, and it was not a very large vessel, and we elected not to dilate into the side branch. The patient tolerated the procedure well and left the laboratory in satisfactory condition.  RESULTS: 1. The left main coronary artery:  The left main coronary artery had a 40%    diffuse stenosis. 2. The left anterior descending artery:  The left anterior    descending artery was completely occluded at its origin. 3. The circumflex artery:  The circumflex artery gave rise to a small to    moderate sized marginal branch, a posterolateral branch, and a    posterior descending branch.  There was 70% narrowing in the proximal    circumflex just after the marginal branch.  There was 40% narrowing in    the mid vessel. 4. The right coronary artery:  The right coronary artery was a nondominant    vessel that gave rise to a conus branch and 2 right ventricular branches.    There was 80% narrowing in the proximal portion of the vessel which had not    changed. 5. The saphenous vein graft to the diagonal branch of the LAD and the    intermedius branch of the circumflex artery was patent and functioned    normally. 6. The LIMA graft to the mid LAD was patent although it was somewhat atrophic. 7. The vein graft to the diagonal  branch also filled the LAD, and there was an    80% narrowing before the insertion of the LIMA graft.  Left ventriculogram:  The left ventriculogram, performed in the RAO projection, showed good wall motion with no areas of hypokinesis.  The estimated ejection fraction was 55%.  Left ventriculogram:  The left ventriculogram, performed in the LAO projection, showed good wall motion with no areas of hypokinesis.  Following stenting of the circumflex marginal vessel, the stenosis improved from 70% to less than 10%.  There was no dissection seen.  There was compromise of the small to moderate sized marginal branch with a 90% ostial stenosis but with good  flow.  CONCLUSIONS: 1. Coronary artery disease status post coronary bypass graft surgery in    1990 with 40% stenosis in the left main artery, total occlusion of the    left anterior descending artery, 70% stenosis in the proximal circumflex    artery, and 80% stenosis in a nondominant right coronary artery, with a    patent vein graft to the diagonal branch and intermedius branch of the    left anterior descending artery, and a partially atrophic LIMA graft to    the LAD and good LV function. 2. Successful stent deployment in the proximal circumflex artery with    improvement in percent area narrowing from 70% to less than 10%.  DISPOSITION:  The patient was transferred to the post anesthesia unit for further observation. DD:  11/30/00 TD:  12/01/00 Job: [id number]

## 2010-12-31 NOTE — Op Note (Signed)
NAMEOUSMAN, DISE             ACCOUNT NO.:  [id number]   MEDICAL RECORD NO.:  [id number]          PATIENT TYPE:  AMB   LOCATION:  DAY                          FACILITY:  [facility]   PHYSICIAN:  Ollen Gross, M.D.    DATE OF BIRTH:  [birth year]   DATE OF PROCEDURE:  07/07/2006  DATE OF DISCHARGE:                               OPERATIVE REPORT   PREOPERATIVE DIAGNOSIS:  Right knee medial meniscal tear.   POSTOPERATIVE DIAGNOSIS:  Right knee medial meniscal tear plus chondral  defect medial femoral condyle.   PROCEDURE:  Right knee arthroscopy with meniscal debridement and  chondroplasty.   SURGEON:  Ollen Gross, M.D.   ASSISTANT:  No assistant.   ANESTHESIA:  Local with MAC.   ESTIMATED BLOOD LOSS:  Minimal.   DRAINS:  None.   COMPLICATIONS:  None.   CONDITION:  Stable to recovery room.   BRIEF CLINICAL NOTE:  Mr. [name] is a 70 year old male with  significant right knee pain and mechanical symptoms.  Exam and history  suggest a medial meniscal tear confirmed by MRI.  He presents now for  arthroscopy and debridement.   PROCEDURE IN DETAIL:  After successful administration of local with MAC  anesthetic a tourniquet was placed high on the right thigh and right  lower extremity, prepped and draped in the usual sterile fashion.  Standard superomedial and inferolateral incisions was made.  Inflow  cannula was passed superomedial and camera was passed inferolateral.  Arthroscopic visualization proceeds.  Undersurface of the patella shows  minimal chondromalacia.  The trochlea looks fine.  Medial and lateral  gutters were visualized.  There were no loose bodies.  Flexion and  valgus force was applied to the knee and the medial compartment was  entered.  He has a significant tear in the body and posterior horn of  the medial meniscus and about 1 x 1 cm area of chondral delamination off  the medial femoral condyle.  Spinal needles were used to localize the  inferomedial  portal.  A small incision was made, dilator placed and the  meniscus was debrided back to stable base with baskets and a 4.2 shaver  and then sealed off with the ArthroCare device.  It is found be very  stable at this point.  The chondral defect was debrided back to a stable  bony base with stable cartilaginous edges.  Celestone 1 x 1 cm.  The  rest the medial femoral condyle looks fine.  The medial tibial plateau  looks fine.  The intercondylar notch was visualized, ACL was normal.  Lateral compartment was entered.  There is some free edge tearing in the  lateral meniscus which was debrided back to stable base with the baskets  and shaver.  The arthroscopic equipment was then removed from the  inferior portals  which were closed with interrupted 4-0 nylon.  Then 20 mL of 0.25%  Marcaine with epinephrine was injected through the inflow cannula and  then that was removed and that portal was closed with nylon.  A bulky  sterile dressing was applied and he is awakened and transported to  recovery in stable condition.      Ollen Gross, M.D.  Electronically Signed     FA/MEDQ  D:  07/07/2006  T:  07/07/2006  Job:  [id number]

## 2010-12-31 NOTE — H&P (Signed)
[facility]  Patient:    [name], [name]                      MRN: [id number] Adm. Date:  08/14/00 Attending:  Ollen Gross, M.D. Dictator:   Della Goo, P.A.                         History and Physical  CHIEF COMPLAINT:  Left hip pain.  HISTORY OF PRESENT ILLNESS:  The patient is a 70 year old white male who is status post right total hip arthroplasty in November of 1999 by Maisie Fus L. Meade Maw, M.D. As Thomas L. 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, he has been seen recently by Ollen Gross, M.D. for follow-up of left hip pain. His left hip pain has been progressively worse and is now causing him to have significant dysfunction. His main concern is a leg length discrepancy as the right side is approximately 3/4 inch longer than the left. Examination of the left hip reveals tremendous pain with any type of flexion and rotation maneuver. He has no pain with range of motion of the right hip. Radiographs revealed bone-on-bone changes of the left hip with an approximately 3/4 inch leg length discrepancy. Due to his significant findings on x-ray, as well as his continued symptoms of pain and functional limitations, it was felt he would require surgical intervention. After discussion of risks and benefits, he is now scheduled to undergo a left total replacement by Ollen Gross, M.D.  CURRENT MEDICATIONS: 1. Pravachol 20 mg once daily. 2. Vioxx 25 mg once daily. 3. The patient usually uses aspirin daily. However, this was stopped 2 weeks    preoperatively.  ALLERGIES:  The patient states MORPHINE caused him severe nausea after his previous total hip replacement. He also is allergic to NAPROSYN. However, does not recall the reaction.  PAST MEDICAL HISTORY:  Significant for coronary artery disease. He is status post MI. The patient has history of DVT to the left lower extremity in 1998 which was treated with heparin and  Coumadin for approximately 8 months. This was not a postsurgical DVT. The patient has history of elevated cholesterol.  PAST SURGICAL HISTORY:  Coronary artery bypass graft with the grafts being taken from the left lower extremity. This was done in January of 1990. 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 Jacqulyn Bath, a right total hip replacement in 1990, and recently in September of 2001 underwent emergency appendectomy for a ruptured appendix. He has undergone a recent cardiac catheterization and felt to be clear from a cardiac standpoint for the upcoming procedure. His family medical doctor is Dr. [name], and his cardiologist is Bruce R. Juanda Chance, M.D.  FAMILY HISTORY:  Patients mother died with history of heart attack, stroke, and diabetes. Patients father had a stroke, and patient has one sister that died from cancer.  SOCIAL HISTORY:  The patient lives with his wife. He has no intake of tobacco or alcohol. The patient previously worked with the [employer] and also at [employer] full-time. However, he under total disability at this time secondary to his coronary problems. The patient and his wife state they have all the necessary equipment needed postoperatively as they obtained this from his previous surgery. However, they do not have a walker.  REVIEW OF SYSTEMS:  CNS:  The patient denies blurred  vision, double vision, seizure disorder, headaches, or paralysis. CARDIORESPIRATORY:  No chest pain, shortness of breath, cough, sputum production, or hemoptysis. GI/GU:  No nausea, vomiting, diarrhea, or constipation. No dysuria, hematuria, melena, or bloody stools. MUSCULOSKELETAL:  As per history of present illness. HEMATOLOGICAL:  The patient denies history of jaundice, hepatitis, anemia, bleeding disorders. He does have history of a blood clot to the left lower extremity in  1998.  PHYSICAL EXAMINATION:  GENERAL:  He is a well-developed, well-nourished, white male. Alert and oriented x 4 in no acute distress.  VITAL SIGNS:  The patient is afebrile, pulse 72 and regular, blood pressure 138/68.  HEENT:  Normocephalic, atraumatic. Pupils equal, round, and reactive to light and accommodation. Extraocular movements intact. Nose without drainage. Oropharynx without edema or erythema.  NECK:  Supple. No adenopathy or thyromegaly.  LUNGS:  Clear to auscultation.  HEART:  Regular rate and rhythm. He has a grade 2/6 systolic murmur heard best at left sternal border.  ABDOMEN:  Bowel sounds present. Soft, nontender.  GENITOURINARY/RECTAL/BREASTS:  Not performed. Not pertinent to present illness.  EXTREMITIES:  The patient has 3/4 inch shortening of the left lower extremity as compared to the right. Distal pulses in the lower extremities are intact bilaterally. There is no clubbing, cyanosis, or edema. Examination of the left hip reveals flexion to approximately 90 degrees with 5 degrees of internal rotation and 20 degrees of external rotation with only 20 degrees of abduction. He has significant pain with flexion and rotation maneuver.  IMPRESSION: 1. End-stage osteoarthritis of the left hip. 2. History of deep venous thrombosis left lower extremity, 1998. Treated with    heparin and Coumadin. 3. Coronary artery disease, status post coronary artery bypass graft, 1990. 4. Elevated cholesterol currently on medication. 5. Status post right total hip replacement, 1999. 6. Recent cardiac catheterization, July 27, 2000, with clearance for    upcoming procedure.  PLAN:  The patient will be admitted to undergo a left total hip arthroplasty by Ollen Gross, M.D. Dr. [name] will be asked to assist with medical concerns if necessary during the hospital stay. His cardiologist is Bruce R.  Juanda Chance, M.D., and he will be consulted if necessary during the  hospital stay. DD:  08/11/00 TD:  08/11/00 Job: [id number]

## 2010-12-31 NOTE — Discharge Summary (Signed)
[facility]  Patient:    [name]                    MRN: [id number] Adm. Date:  [id number] Disc. Date: [id number] Attending:  Loanne Drilling Dictator:   Druscilla Brownie Shela Nevin, P.A. CC:         Stacie Glaze, M.D. [facility]  Bruce R. Juanda Chance, M.D. [facility]   Discharge Summary  ADMISSION DIAGNOSES: 1. End-stage osteoarthritis of the left hip. 2. History of deep vein thrombosis left lower extremity. 3. Coronary artery disease, status post coronary artery bypass graft. 4. Hypercholesterolemia. 5. Status post right total hip replacement in 1999. 6. Recent cardiac catheterization, clearance for surgery.  DISCHARGE DIAGNOSES: 1. End-stage osteoarthritis of the left hip. 2. History of deep vein thrombosis left lower extremity. 3. Coronary artery disease, status post coronary artery bypass graft. 4. Hypercholesterolemia. 5. Status post right total hip replacement in 1999. 6. Recent cardiac catheterization, clearance for surgery. 7. Mild postoperative anemia.  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  Avel Peace, P.A.C., assisted.  HISTORY OF PRESENT ILLNESS:  This 70 year old male successfully underwent a right total hip replacement arthroplasty in November 1999.  He has done very well after that surgery.  The patient has avascular necrosis of the left hip, and he has developed a leg-length discrepancy with shortening on the left.  He has pain with range of motion.  He has developed bone-on-bone changes with collapse of the femoral head.  Due to these significant findings, it was felt he would benefit from surgical intervention and is being admitted for the above procedure.  The patient was cleared by Dr. [name] medically and by Dr. Charlies Constable as far as his cardiology status is concerned.  HOSPITAL COURSE:  The patient  tolerated the surgical procedure quite well.  He was very familiar with the total hip protocol and did well postoperatively with physical therapy.  Hemovac was removed on the first postoperative day without difficulty.  Eventually he was weaned from his IV analgesics to p.o. analgesics, and he tolerated them quite well.  He did complain of some right shoulder pain postoperatively.  We thought that we may need to inject that shoulder, but it got better, so we will follow it up on an outpatient basis. On the day we planned to discharge him, his INR was up to 4.8, so we decided to keep him another day.  The pharmacy adjusted his Coumadin, and it normalized in the stable range.  We, therefore, decided we could manage him on an outpatient basis, and plans were made for discharge.  On the day of discharge his wound was dry, neurovascular was intact.  He was afebrile.  LABORATORY DATA:  Preoperative CBC completely within normal limits.  Final hemoglobin was 10.3, hematocrit was 27.8.  Final INR was 1.9 with the PT at 18.3.  Electrolytes x 2 within normal limits.  Urinalysis negative for urinary tract infection.  No chest x-ray seen in this chart.  No electrocardiogram seen in this chart.  CONDITION ON DISCHARGE:  Improved and stable.  PLAN:  The patient is discharged to his home in the care of his family.  He is to continue with Genevieve Norlander for outpatient physical therapy.  Continue with Coumadin protocol for three weeks for hip precautions and then per his family doctor.  DISCHARGE MEDICATIONS: 1. Trinsicon #60  1 b.i.d. 2. Percocet 5 mg #50 1 q.6h. p.r.n. pain. 3. Robaxin 500 mg #30 with a refill, 1 q.4h. p.r.n. pain. 4. Take no aspirin or NSAIDs. 5. Continue with home medications.  DISCHARGE INSTRUCTIONS:  Take no aspirin or NSAIDs.  Continue with home medications and diet under the direction of Dr. Darryll Capers. DD:  08/23/00 TD:  08/23/00 Job: [id number]

## 2010-12-31 NOTE — Cardiovascular Report (Signed)
[place]. [facility]  Patient:    [name], [name]                    MRN: [id number] Proc. Date: 07/27/00 Adm. Date:  [id number] Disc. Date: [id number] Attending:  Cherylynn Ridges CC:         Stacie Glaze, M.D. [facility]  Cardiac Catheterization Lab   Cardiac Catheterization  CLINICAL HISTORY:  [name] is 70 years old and had a remote anterior infarction and had bypass surgery in 1990.  He was scheduled for hip surgery and did a Cardiolite scan prior to the surgery to evaluate his cardiac risk, and this suggested possible apical ischemia.  For this reason, he was brought in for catheterization.  DESCRIPTION OF PROCEDURE:  The procedure was performed via the right femoral artery using an arterial sheath and 6-French preformed coronary catheters.  A front wall arterial puncture was performed.  Omnipaque contrast was used.  The vein graft was injected with a JR4 diagnostic right catheter.  The LIMA graft was injected with a LIMA catheter.  A distal aortogram was performed to rule out abdominal aortic aneurysm.  The right femoral artery was closed with Perclose at the end of the procedure.  The patient tolerated the procedure well and left the lab in satisfactory condition.  RESULTS: 1. The left main coronary artery:  The left main coronary artery is free of    significant disease. 2. The left anterior descending artery:  The left anterior descending artery    was completely occluded at its ostium, and this was a flesh occlusion. 3. The circumflex artery:  The circumflex artery was a dominant vessel that    gave rise to an intermedius branch which was totally occluded, a marginal    branch, a posterolateral and a posterior descending branch.  There was 40%    narrowing in the proximal vessel just after the marginal branch. 4. The right coronary artery:  The right coronary artery was a nondominant    vessel that gave rise to a conus branch and two right  ventricular branches.    There was 80% narrowing in the mid vessel. 5. The saphenous vein graft to the diagonal branch of the LAD and the    intermedius branch of the circumflex artery was a wide graft and was    patent, and there was no significant distal disease and no obstruction in    the graft. 6. The LIMA graft to the LAD was patent and functioned normally.  This filled    the distal LAD.  There was an 80% stenosis in the LAD proximal to the graft    insertion site.  Left ventriculogram:  The left ventriculogram, performed in the RAO projection, showed good wall motion with no areas of hyperkinesis.  The estimated ejection fraction was 60%.  Distal aortogram:  A distal aortogram was performed which showed patent renal arteries.  The distal aorta was irregular, but there was no significant aortoiliac obstruction.  CONCLUSION:  Coronary artery disease status coronary artery bypass graft surgery in 1990 with total occlusion of the native LAD, a 40% narrowing in the proximal circumflex artery, an 80% narrowing in the mid portion of a nondominant right coronary artery, with a patent sequential vein graft to the diagonal branch of the LAD and the intermedius branch of the circumflex artery, a patent LIMA graft to the LAD, and normal LV function.  RECOMMENDATIONS:  There is no source of  ischemia.  The right ventricular branch, I do not think, is big enough to be causing any ischemia or ischemic symptoms, and the scan was not abnormal in this area.  Will plan reassurance and let the patient proceed with hip surgery. DD:  07/27/00 TD:  07/27/00 Job: [id number]

## 2010-12-31 NOTE — H&P (Signed)
[place]  Patient:    [name], [name]                    MRN: [id number] Adm. Date:  [id number] Attending:  Cherylynn Ridges CC:         Everardo Beals Juanda Chance, M.D. [facility]  Trudi Ida. Denton Lank, M.D.   History and Physical  IDENTIFICATION AND CHIEF COMPLAINT:  The patient is a 70 year old gentleman with acute appendicitis.  HISTORY OF PRESENT ILLNESS:  The patient started getting ill yesterday evening at approximately 4 oclock after a meal, with abdominal pain generalized in the upper portion of the abdomen in the epigastrium.  This continued throughout the evening.  He had difficulty sleeping, and it eventually migrated to the right lower quadrant, according to the patients recount of what happened.  He came to the emergency room approximately 7 a.m. this morning with abdominal pain in the right lower quadrant with rebound and guarding in that area.  A surgical consultation was obtained immediately, with minimal studies being done.  On evaluation by the surgeon, the patient was found to have excellent findings for acute appendicitis, and he was scheduled for the OR.  PAST MEDICAL HISTORY:  Coronary artery disease.  He had a bypass in 1990.  He has had minimal symptoms since that time, and he takes only aspirin related to his cardiac disease.  He takes Pravachol and also Vioxx for gastrointestinal reflux disease and also for arthritis.  He has no known drug allergies, although he reported to the ER nurse that he gets nauseated with some morphine and possibly had a confusional reaction related to that when he had his hip replacement.  He is not specifically allergic to morphine.  PAST SURGICAL HISTORY:  He had a right hip arthroplasty in 1999, and a bypass in 1990.  He has had no other operations.  Has had no surgery on his abdomen.  REVIEW OF SYSTEMS:  He has had no nausea, vomiting.  No fevers or chills.  His appetite currently is good; however, he  really just wants to drink some fluids.  PHYSICAL EXAMINATION:  VITAL SIGNS:  He is afebrile.  His other vital signs are stable.  HEART:  Normocephalic, atraumatic.  NECK:  Supple.  CHEST:  Clear.  CARDIAC:  Regular rhythm and rate with no murmurs, no gallops or heaves.  ABDOMEN:  Slightly distended, with hypoactive bowel sounds.  He has marked tenderness in the right lower quadrant with a positive Rovsings sign, a positive psoas sign.  Shake, tap, and cough tenderness in the right lower quadrant.  RECTAL:  He has no palpable masses.  No exacerbation of his abdominal pain on exam.  LABORATORY DATA:  CBC shows a white count of 11.1 thousand with a left shift. His hemoglobin is normal.  Chest x-ray within normal limits.  EKG within normal limits.  IMPRESSION:  Good clinical story and examination for acute appendicitis. Other possibilities include diverticulitis, Meckels diverticulitis, or severe gastroenteritis.  However, I am 90% sure this patient has appendicitis and have scheduled him for a laparoscopic appendectomy or possible open appendectomy.  PLAN:  Take him to the OR after receiving IV Unasyn therapy and to perform laparoscopic appendectomy. DD:  04/10/00 TD:  04/10/00 Job: [id number]

## 2010-12-31 NOTE — Assessment & Plan Note (Signed)
[facility]                            CARDIOLOGY OFFICE NOTE   NAME:Aschenbrenner, CHRISHAUN SASSO                    MRN:          [id number]  DATE:07/21/2006                            DOB:          1941/02/02    PRIMARY CARE PHYSICIAN:  Darryll Capers.   CLINICAL HISTORY:  Mr. [name] is a 70 years old and had bypass  surgery in 1990 and had a bare-metal stent placed to the native  circumflex artery in 2002.  He has a vein graft to 2 diagonal branches  of the LAD which is patent, and the internal mammary artery is patent  LAD at last catheterization, and he has good LV function.   He has been doing quite well from his heart, has had no recent chest  pain, shortness of breath or palpitations.  He underwent arthroscopic  knee surgery 2 weeks ago.   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   CURRENT MEDICATIONS:  Include aspirin, Plavix, Vytorin and  multivitamins.   PHYSICAL EXAMINATION:  VITAL SIGNS:  The blood pressure was 128/72 and  the pulse 75 and regular.  There is no venous distention.  NECK:  The carotids were full without bruits.  CHEST:  Clear.  CARDIAC:  Regular.  No murmurs or gallop.  ABDOMEN:  Soft with normal bowel sounds.  There is no  hepatosplenomegaly.  The peripheral pulses were full and no peripheral  edema.   An electrocardiogram showed sinus rhythm, left axis deviation and poor R-  wave progression, non-specific ST-T changes.   IMPRESSION:  1. Coronary artery disease status post coronary artery bypass graft      during 1990 and status post bare metal stent to the circumflex      artery in 2002 with coronary anatomy as described above.  2. Good left ventricular function.  3. Hyperlipidemia.  4. Hip showed deep vein thrombophlebitis.  5. Degenerative joint disease of the lumbar spine.   RECOMMENDATIONS:   Mr. Hopkinson's last lipid panel was good in terms of  his total cholesterol 136 and an LDL of 81, although his HDL was 28.  He  would like to get off the Plavix because of easy bruising, and he has a  bare metal stent, and I think we can do this.  He is not on a beta  blocker.  It looks like his pulse and blood pressure will tolerate this.  I think this will  give him some protection.  He is agreeable to trying this.  I will plan  to see him back in followup in 12 months.     Bruce Elvera Lennox Juanda Chance, MD, [facility]  Electronically Signed    BRB/MedQ  DD: 07/21/2006  DT: 07/21/2006  Job #: [id number]

## 2010-12-31 NOTE — Op Note (Signed)
[facility]  Patient:    [name], [name]                      MRN: [id number] Proc. Date: 08/14/00 Attending:  Ollen Gross, M.D.                           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  PROCEDURE:  Left total hip arthroplasty.  SURGEON:  Ollen Gross, M.D.  ASSISTANT:  Alexzandrew L. Perkins, P.A.-C.  ANESTHESIA:  General.  ESTIMATED BLOOD LOSS:  500 cc.  DRAINS:  Hemovac x 1.  COMPLICATIONS:  None.  CONDITION:  Stable to recovery.  BRIEF CLINICAL NOTE:  Mr. Cecchi is a 70 year old male who had a right total hip arthroplasty done approximately two years ago by Maisie Fus L. Presson, M.D., for AVN with good result.  He has had progressively worsening symptoms of the left hip and presents now for left total hip arthroplasty.  PROCEDURE IN DETAIL:  After successful administration of general anesthetic, the patient was placed in the right lateral decubitus position with the left side up and held with the hip positioner.  The left lower extremity was isolated from the perineum with plastic drapes and prepped and draped in the usual sterile fashion.  A standard posterolateral incision was made.  The skin was cut with a 10 blade to the subcutaneous tissues to the level of the fascia lata, which was incised in line with the skin incision.  Short external rotators were then isolated off of the femur and capsulectomy performed.  The hip was dislocated and the center of the femoral head was marked.  For preoperative templating, we had to put the center of his trial head about 4 mm about the center of his native femoral head in order to restore equal leg lengths.  This was done and the osteotomy line marked on the femoral neck and cut with an oscillating saw.  The femur was then retracted  anteriorly.  The anterior capsule was removed and acetabular exposure obtained.  He had a lot of central osteophyte which was removed.  He also had posterior osteophyte which was removed.  Acetabular reaming was then performed, starting with a 46 increasing increments to 56 and up to a 57.  A 58 mm pinnacle cup was impacted into the acetabulum in approximately 40 degrees of abduction and 20 degrees of forward flexion, matching his native anteversion.  The cup was then fixed with two dome screws.  A trial neutral 28 mm liner was placed.  Femoral preparation was initiated with the canal finder and then the canal was irrigated.  Axial reaming was performed up to 13.5 mm.  Proximal reaming was performed up to an 46F.  The sleeve was machined to an extra extra large.  A trial 46F extra extra sleeve was impacted into the proximal femur, then an 18 x 13 stem with a 36 + 8 neck was placed.  A neutral head 28 mm in place reduction performed and the patient had outstanding stability, flexion to 70, adduction 40, internal rotation 90, flexion 90, internal rotation 90, and full extension and full external rotation.  When placing against the other leg and looking at the relationship between the tip of the trochanter and center of the head, it was noted that tip to center  was maintained, but that for templating we wanted to be about 4 mm above.  Thus, we did a +6 head.  The hip was reduced and at the same stability parameters, but better soft tissue tension.  At this point, the trials were removed and the apex hole eliminator placed into the acetabular shell.  The permanent 28 mm neutral marathon liner was impacted into the acetabular shell.  The reamer was then prepared with the 6F extra extra large sleeve impacted into the proximal femur and 18 x 13 stem with 36 + 8 was placed, matching his native anteversion.  The permanent 28 + 6 head was placed.  The hip was reduced with the same  stability parameters.  The wound was copiously irrigated with antibiotic solution and the short external rotator was reattached to the femur drill holes.  The fascia lata and fascia of the gluteus maximum was then closed with interrupted 0 Vicryl over one limb of the Hemovac drain.  The subcu was closed in two layers with interrupted 0 and then 2-0 Vicryl and subcuticular closed with running 4-0 Monocryl.  The incision was clean and dry.  Steri-Strips with a bulky sterile dressing were applied.  The drains were hooked to suction.  The patient was placed into a knee immobilizer, awakened, and transferred to recovery in stable condition. DD:  08/14/00 TD:  08/14/00 Job: [id number]

## 2011-01-04 ENCOUNTER — Encounter: Payer: Self-pay | Admitting: Internal Medicine

## 2011-01-04 ENCOUNTER — Ambulatory Visit (INDEPENDENT_AMBULATORY_CARE_PROVIDER_SITE_OTHER): Payer: Medicare Other | Admitting: Internal Medicine

## 2011-01-04 DIAGNOSIS — K4090 Unilateral inguinal hernia, without obstruction or gangrene, not specified as recurrent: Secondary | ICD-10-CM

## 2011-01-04 DIAGNOSIS — M5116 Intervertebral disc disorders with radiculopathy, lumbar region: Secondary | ICD-10-CM

## 2011-01-04 DIAGNOSIS — I10 Essential (primary) hypertension: Secondary | ICD-10-CM

## 2011-01-04 DIAGNOSIS — M5126 Other intervertebral disc displacement, lumbar region: Secondary | ICD-10-CM

## 2011-01-04 DIAGNOSIS — K469 Unspecified abdominal hernia without obstruction or gangrene: Secondary | ICD-10-CM

## 2011-01-04 DIAGNOSIS — E785 Hyperlipidemia, unspecified: Secondary | ICD-10-CM

## 2011-01-04 MED ORDER — EZETIMIBE-SIMVASTATIN 10-20 MG PO TABS: 1.0000 | ORAL_TABLET | ORAL | Status: DC

## 2011-01-04 NOTE — Progress Notes (Signed)
Subjective:    Patient ID: [name], male    DOB: Feb 26, 1941, 70 y.o.   MRN: [id number]  HPI the patient has an enlarging right inguinal hernia that is causing him pain.  We referred him to neurosurgery for consideration of his radicular back pain an MRI was performed that shows progressive disc disease as well as spinal stenosis.  Because of the extensive nature of surgery needed pain injection therapy was going to be given a second trial to see if pain relief can be achieved.  Blood pressure is up today but he is in apparent pain and uncomfortable We attempted to treat his cholesterol at a daily dose of Vytorin 10/20 he was unable to tolerate this and has decreased it to every other day we added Fish oil and he developed indigestion from the fish oil He stopped the prostate medication and the dizziness resolved   Review of Systems  Constitutional: Positive for activity change. Negative for fever and fatigue.  HENT: Negative for hearing loss, congestion, neck pain and postnasal drip.   Eyes: Negative for discharge, redness and visual disturbance.  Respiratory: Negative for cough, shortness of breath and wheezing.   Cardiovascular: Negative for leg swelling.  Gastrointestinal: Negative for abdominal pain, constipation and abdominal distention.  Genitourinary: Negative for urgency and frequency.  Musculoskeletal: Positive for myalgias, back pain, joint swelling and gait problem. Negative for arthralgias.  Skin: Negative for color change and rash.  Neurological: Negative for weakness and light-headedness.  Hematological: Negative for adenopathy.  Psychiatric/Behavioral: Negative for behavioral problems.   Past Medical History  Diagnosis Date  . HYPERLIPIDEMIA 04/26/2007  . HYPERTENSION 04/30/2007  . 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 GERD 04/30/2007  . INGUINAL HERNIA,  RIGHT 05/26/2010  . BENIGN PROSTATIC HYPERTROPHY, WITH URINARY OBSTRUCTION 09/06/2007  . 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 LOC OSTEOARTHROS NOT SPEC PRIM/SEC LOWER LEG 11/05/2009  . OSTEOARTHRITIS 04/26/2007  . 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 Edema 04/20/2010  . PSA, INCREASED 07/09/2008  . DVT, HX OF 04/26/2007  . Lumbar disc disease with radiculopathy   . Spinal stenosis of lumbar region    Past Surgical History  Procedure Date  . Appendectomy   . Coronary artery bypass graft     3vessel  in 1990  . Cataract extraction, bilateral   . Joint replacement     both hips in 2002  . Spine surgery 1984    two ruptured discs/fusion  . Knee arthroscopy     x 2    reports that he quit smoking about 22 years ago. He does not have any smokeless tobacco history on file. He reports that he does not drink alcohol or use illicit drugs. family history includes Cancer in his sister; Diabetes in his brother and mother; Heart attack in his mother; Heart disease in his father; and Stroke in his father and mother. Allergies  Allergen Reactions  . Morphine   . Rofecoxib        Objective:   Physical Exam  Constitutional: He is oriented to person, place, and time. He appears well-developed and well-nourished.  HENT:  Head: Normocephalic and atraumatic.  Eyes: Conjunctivae are normal. Pupils are equal, round, and reactive to light.  Neck: Normal range of motion. Neck supple.  Cardiovascular: Normal rate and regular rhythm.   Pulmonary/Chest: Effort normal and breath sounds normal.  Abdominal: Soft. Bowel sounds are normal.  Musculoskeletal:       Lumbar lordosis  Neurological: He is alert and oriented to person, place, and time.          Assessment & Plan:  The patient has planned back injections in the next week we will rate for the results of these injections and the neurosurgeon we'll consider a surgical approach only if these injections fail.  The patient's cholesterol was to  be treated with a daily dose but he is unable to tolerate this we will continue him on every other day Vytorin 1020. Blood pressure is elevated due to pain Hopefully the pain injections will lower the threshold of this pain Patient agrees that weight loss is important

## 2011-04-11 ENCOUNTER — Ambulatory Visit (INDEPENDENT_AMBULATORY_CARE_PROVIDER_SITE_OTHER): Payer: Medicare Other | Admitting: Internal Medicine

## 2011-04-11 ENCOUNTER — Encounter: Payer: Self-pay | Admitting: Internal Medicine

## 2011-04-11 DIAGNOSIS — E785 Hyperlipidemia, unspecified: Secondary | ICD-10-CM

## 2011-04-11 DIAGNOSIS — R600 Localized edema: Secondary | ICD-10-CM

## 2011-04-11 DIAGNOSIS — M549 Dorsalgia, unspecified: Secondary | ICD-10-CM

## 2011-04-11 DIAGNOSIS — R238 Other skin changes: Secondary | ICD-10-CM

## 2011-04-11 DIAGNOSIS — R233 Spontaneous ecchymoses: Secondary | ICD-10-CM

## 2011-04-11 DIAGNOSIS — R609 Edema, unspecified: Secondary | ICD-10-CM

## 2011-04-11 LAB — CBC WITH DIFFERENTIAL/PLATELET
Basophils Absolute: 0 10*3/uL (ref 0.0–0.1)
Basophils Relative: 0.5 % (ref 0.0–3.0)
Eosinophils Absolute: 0.2 10*3/uL (ref 0.0–0.7)
Eosinophils Relative: 3.5 % (ref 0.0–5.0)
HCT: 43.6 % (ref 39.0–52.0)
Hemoglobin: 15 g/dL (ref 13.0–17.0)
Lymphocytes Relative: 22.3 % (ref 12.0–46.0)
Lymphs Abs: 1.2 10*3/uL (ref 0.7–4.0)
MCHC: 34.4 g/dL (ref 30.0–36.0)
MCV: 95.7 fl (ref 78.0–100.0)
Monocytes Absolute: 0.5 10*3/uL (ref 0.1–1.0)
Monocytes Relative: 9.9 % (ref 3.0–12.0)
Neutro Abs: 3.5 10*3/uL (ref 1.4–7.7)
Neutrophils Relative %: 63.8 % (ref 43.0–77.0)
Platelets: 199 10*3/uL (ref 150.0–400.0)
RBC: 4.55 Mil/uL (ref 4.22–5.81)
RDW: 13.4 % (ref 11.5–14.6)
WBC: 5.5 10*3/uL (ref 4.5–10.5)

## 2011-04-11 LAB — HEPATIC FUNCTION PANEL
ALT: 23 U/L (ref 0–53)
AST: 21 U/L (ref 0–37)
Albumin: 4.1 g/dL (ref 3.5–5.2)
Alkaline Phosphatase: 36 U/L — ABNORMAL LOW (ref 39–117)
Bilirubin, Direct: 0.1 mg/dL (ref 0.0–0.3)
Total Bilirubin: 0.6 mg/dL (ref 0.3–1.2)
Total Protein: 6.7 g/dL (ref 6.0–8.3)

## 2011-04-11 LAB — LIPID PANEL
Cholesterol: 181 mg/dL (ref 0–200)
HDL: 41.2 mg/dL (ref >39.00)
LDL Cholesterol: 114 mg/dL — ABNORMAL HIGH (ref 0–99)
Total CHOL/HDL Ratio: 4
Triglycerides: 128 mg/dL (ref 0.0–149.0)
VLDL: 25.6 mg/dL (ref 0.0–40.0)

## 2011-04-11 LAB — APTT: aPTT: 26.7 s (ref 21.7–28.8)

## 2011-04-11 NOTE — Patient Instructions (Signed)
Go to every other day on the aspirin

## 2011-04-11 NOTE — Progress Notes (Signed)
Subjective:    Patient ID: [name], male    DOB: [birth year], 70 y.o.   MRN: [id number]  HPI patient has a history of peripheral vascular disease and status post coronary artery bypass he is currently on aspirin 81 mg daily and has noticed an increased amount of bleeding and bruising.  Other medications that he takes includes Vytorin Proscar and/or Percocet for pain he is also and is on testosterone supplementation.  He's not on any other anticoagulation and he has not been on this goal.  He is due monitoring of his lipids today and we will monitor other appropriate parameters to make sure that liver function is good clotting factors are normal and CBC differential are normal    Review of Systems  Constitutional: Negative for fever and fatigue.  HENT: Negative for hearing loss, congestion, neck pain and postnasal drip.   Eyes: Negative for discharge, redness and visual disturbance.  Respiratory: Negative for cough, shortness of breath and wheezing.   Cardiovascular: Negative for leg swelling.  Gastrointestinal: Negative for abdominal pain, constipation and abdominal distention.  Genitourinary: Negative for urgency and frequency.  Musculoskeletal: Negative for joint swelling and arthralgias.  Skin: Negative for color change and rash.  Neurological: Negative for weakness and light-headedness.  Hematological: Negative for adenopathy. Bruises/bleeds easily.  Psychiatric/Behavioral: Negative for behavioral problems.   Past Medical History  Diagnosis Date  . HYPERLIPIDEMIA 04/26/2007  . HYPERTENSION 04/30/2007  . 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 GERD 04/30/2007  . INGUINAL HERNIA, RIGHT 05/26/2010  . BENIGN PROSTATIC HYPERTROPHY, WITH URINARY OBSTRUCTION 09/06/2007  . 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 LOC OSTEOARTHROS NOT SPEC PRIM/SEC LOWER LEG 11/05/2009  . OSTEOARTHRITIS  04/26/2007  . 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 Edema 04/20/2010  . PSA, INCREASED 07/09/2008  . DVT, HX OF 04/26/2007  . Lumbar disc disease with radiculopathy   . Spinal stenosis of lumbar region    Past Surgical History  Procedure Date  . Appendectomy   . Coronary artery bypass graft     3vessel  in 1990  . Cataract extraction, bilateral   . Joint replacement     both hips in 2002  . Spine surgery 1984    two ruptured discs/fusion  . Knee arthroscopy     x 2    reports that he quit smoking about 22 years ago. He does not have any smokeless tobacco history on file. He reports that he does not drink alcohol or use illicit drugs. family history includes Cancer in his sister; Diabetes in his brother and mother; Heart attack in his mother; Heart disease in his father; and Stroke in his father and mother. Allergies  Allergen Reactions  . Morphine   . Rofecoxib        Objective:   Physical Exam  Nursing note and vitals reviewed. Constitutional: He appears well-developed and well-nourished.  HENT:  Head: Normocephalic and atraumatic.  Eyes: Conjunctivae are normal. Pupils are equal, round, and reactive to light.  Neck: Normal range of motion. Neck supple.  Cardiovascular: Normal rate and regular rhythm.   Pulmonary/Chest: Effort normal and breath sounds normal.  Abdominal: Soft. Bowel sounds are normal.  Skin:       Bruising          Assessment & Plan:  Patient has not been taking any other nonsteroidals he takes Tylenol only for back pain as well as the  oxycodone when needed for severe back pain.  His use of oxycodone has been minimal in the past month.  He has noticed E. bruising including bruising by his dog when he jumps on his chest as well as to his arms and legs he is also noticing increased swelling of his lower extremities.  Will monitor CBC differential basic metabolic panel PT PTT and specifically look closely at his platelet count. In the interim  we will reduce his aspirin to every other day. He will probably need a back injection again due to his back pain and will self refer to Dr. Ethelene Hal

## 2011-05-12 LAB — POCT HEMOGLOBIN-HEMACUE
Hemoglobin: 15.7
Operator id: 268271

## 2011-07-12 ENCOUNTER — Encounter: Payer: Self-pay | Admitting: Internal Medicine

## 2011-07-12 ENCOUNTER — Ambulatory Visit (INDEPENDENT_AMBULATORY_CARE_PROVIDER_SITE_OTHER): Payer: Medicare Other | Admitting: Internal Medicine

## 2011-07-12 VITALS — BP 140/75 | HR 68 | Temp 98.2°F | Resp 16 | Ht 73.0 in | Wt 225.0 lb

## 2011-07-12 DIAGNOSIS — T887XXA Unspecified adverse effect of drug or medicament, initial encounter: Secondary | ICD-10-CM

## 2011-07-12 DIAGNOSIS — I10 Essential (primary) hypertension: Secondary | ICD-10-CM

## 2011-07-12 DIAGNOSIS — E785 Hyperlipidemia, unspecified: Secondary | ICD-10-CM

## 2011-07-12 DIAGNOSIS — Z0000 Encounter for general adult medical examination without abnormal findings: Secondary | ICD-10-CM

## 2011-07-12 MED ORDER — OMEPRAZOLE 40 MG PO CPDR: 40.0000 mg | DELAYED_RELEASE_CAPSULE | Freq: Every day | ORAL | Status: DC

## 2011-07-12 NOTE — Patient Instructions (Signed)
The patient is instructed to continue all medications as prescribed. Schedule followup with check out clerk upon leaving the clinic  

## 2011-07-17 NOTE — Progress Notes (Signed)
Subjective:    Patient ID: [name], male    DOB: 09-14-1940, 70 y.o.   MRN: [id number]  HPI Patient presents for routine followup of chronic medical problems which include hypertension hyperlipidemia and resultant CAD risks as well as gastroesophageal reflux.  He states that he has been doing well he is status post CAD with arterial bypass as well as status post carotid arterial occlusion.  His peripheral vascular disease with stable claudication he states it has been no significant changes he is compliant with his medication he denies any chest pain PND orthopnea or increasing  edema or increasing claudication   Review of Systems  Constitutional: Negative for fever and fatigue.  HENT: Negative for hearing loss, congestion, neck pain and postnasal drip.   Eyes: Negative for discharge, redness and visual disturbance.  Respiratory: Negative for cough, shortness of breath and wheezing.   Cardiovascular: Negative for leg swelling.  Gastrointestinal: Negative for abdominal pain, constipation and abdominal distention.  Genitourinary: Negative for urgency and frequency.  Musculoskeletal: Negative for joint swelling and arthralgias.  Skin: Negative for color change and rash.  Neurological: Negative for weakness and light-headedness.  Hematological: Negative for adenopathy.  Psychiatric/Behavioral: Negative for behavioral problems.   Past Medical History  Diagnosis Date  . HYPERLIPIDEMIA 04/26/2007  . HYPERTENSION 04/30/2007  . 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 GERD 04/30/2007  . INGUINAL HERNIA, RIGHT 05/26/2010  . BENIGN PROSTATIC HYPERTROPHY, WITH URINARY OBSTRUCTION 09/06/2007  . 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 LOC OSTEOARTHROS NOT SPEC PRIM/SEC LOWER LEG 11/05/2009  . OSTEOARTHRITIS 04/26/2007  . 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 Edema 04/20/2010  . PSA,  INCREASED 07/09/2008  . DVT, HX OF 04/26/2007  . Lumbar disc disease with radiculopathy   . Spinal stenosis of lumbar region     History   Social History  . Marital Status: Married    Spouse Name: [name]    Number of Children: [name]  . Years of Education: [name]   Occupational History  . Not on file.   Social History Main Topics  . Smoking status: Former Smoker    Quit date: 08/15/1988  . Smokeless tobacco: Not on file  . Alcohol Use: No  . Drug Use: No  . Sexually Active: Yes   Other Topics Concern  . Not on file   Social History Narrative  . No narrative on file    Past Surgical History  Procedure Date  . Appendectomy   . Coronary artery bypass graft     3vessel  in 1990  . Cataract extraction, bilateral   . Joint replacement     both hips in 2002  . Spine surgery 1984    two ruptured discs/fusion  . Knee arthroscopy     x 2    Family History  Problem Relation Age of Onset  . Stroke Mother   . Heart attack Mother   . Diabetes Mother   . Stroke Father   . Heart disease Father   . Cancer Sister   . Diabetes Brother     Allergies  Allergen Reactions  . Morphine   . Rofecoxib     Current Outpatient Prescriptions on File Prior to Visit  Medication Sig Dispense Refill  . aspirin 81 MG tablet Take 81 mg by mouth daily.        Marland Kitchen ezetimibe-simvastatin (VYTORIN) 10-20 MG per tablet Take 1 tablet by  mouth every other day.  30 tablet  11  . finasteride (PROSCAR) 5 MG tablet Take 5 mg by mouth daily.        . nitroGLYCERIN (NITROSTAT) 0.4 MG SL tablet Place 0.4 mg under the tongue every 5 (five) minutes as needed.          BP 140/75  Pulse 68  Temp 98.2 F (36.8 C)  Resp 16  Ht 6\' 1"  (1.854 m)  Wt 225 lb (102.059 kg)  BMI 29.69 kg/m2       Objective:   Physical Exam  Nursing note and vitals reviewed. Constitutional: He appears well-developed and well-nourished.  HENT:  Head: Normocephalic and atraumatic.  Eyes: Conjunctivae are normal. Pupils are  equal, round, and reactive to light.  Neck: Normal range of motion. Neck supple.  Cardiovascular: Normal rate and regular rhythm.   Pulmonary/Chest: Effort normal and breath sounds normal.  Abdominal: Soft. Bowel sounds are normal.          Assessment & Plan:  Stable blood pressure and current medications no changes anticipated.  History of CAD with no chest pain shortness of breath PND orthopnea stable peripheral vascular disease with stable claudication pattern.  Hyperlipidemia on Vytorin 10/20 samples of medications given monitoring of cholesterol review of current laboratory data.  Dupuytren's contracture of left hand discussing injection therapy

## 2011-09-26 ENCOUNTER — Encounter: Payer: Self-pay | Admitting: *Deleted

## 2011-09-27 ENCOUNTER — Ambulatory Visit: Payer: Medicare Other | Admitting: Cardiovascular Disease

## 2011-10-20 ENCOUNTER — Encounter: Payer: Self-pay | Admitting: Cardiovascular Disease

## 2011-10-20 ENCOUNTER — Ambulatory Visit (INDEPENDENT_AMBULATORY_CARE_PROVIDER_SITE_OTHER): Payer: Medicare Other | Admitting: Cardiovascular Disease

## 2011-10-20 DIAGNOSIS — I10 Essential (primary) hypertension: Secondary | ICD-10-CM

## 2011-10-20 DIAGNOSIS — I7389 Other specified peripheral vascular diseases: Secondary | ICD-10-CM

## 2011-10-20 DIAGNOSIS — E785 Hyperlipidemia, unspecified: Secondary | ICD-10-CM

## 2011-10-20 DIAGNOSIS — I25810 Atherosclerosis of coronary artery bypass graft(s) without angina pectoris: Secondary | ICD-10-CM

## 2011-10-20 NOTE — Assessment & Plan Note (Signed)
Blood pressure is well controlled on today's visit. No changes made to the medications. 

## 2011-10-20 NOTE — Assessment & Plan Note (Signed)
Continue aggressive cholesterol management. Repeat aortic ultrasound once a year.

## 2011-10-20 NOTE — Assessment & Plan Note (Addendum)
Cholesterol is above goal. We have suggested he take vytorin whole pill alternating with half a pill with a recheck of his cholesterol when he sees Dr. [name].  Goal LDL less than 70.

## 2011-10-20 NOTE — Patient Instructions (Addendum)
You are doing well. Take vytorin daily if possible for cholesterol   Please call us if you have new issues that need to be addressed before your next appt.  Your physician wants you to follow-up in: 6 months.  You will receive a reminder letter in the mail two months in advance. If you don't receive a letter, please call our office to schedule the follow-up appointment.

## 2011-10-20 NOTE — Assessment & Plan Note (Signed)
Currently with no symptoms of angina. No further workup at this time. Continue current medication regimen. 

## 2011-10-20 NOTE — Progress Notes (Signed)
Patient ID: [name], male    DOB: 1940-11-05, 71 y.o.   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   He reports that he is doing well. He is active, but does not participate in an exercise plan secondary to his hips.    He takes Vytorin 10/20  every other day secondary to a history of muscle ache. He has not had any recent muscle ache. No chest pain, and no lower extremity edema, no near syncope or lightheadedness, no malaise or diaphoresis with exertion. He was told that his last cholesterol looked good. Records indicate total cholesterol 6 months ago was 180, LDL greater than 100   His other problems include peripheral vascular disease with bilateral indices of 0.85. He has seen Dr. Excell Seltzer and medical therapy has been recommended.     EKG shows normal sinus rhythm with rate 80 beats per minute, left axis deviation, no significant ST or T wave changes      Outpatient Encounter Prescriptions as of 10/20/2011  Medication Sig Dispense Refill  . aspirin 81 MG tablet Take 81 mg by mouth daily.        Marland Kitchen ezetimibe-simvastatin (VYTORIN) 10-20 MG per tablet Take 1 tablet by mouth every other day.  30 tablet  11  . finasteride (PROSCAR) 5 MG tablet Take 5 mg by mouth daily.        . nitroGLYCERIN (NITROSTAT) 0.4 MG SL tablet Place 0.4 mg under the tongue every 5 (five) minutes as needed.        Marland Kitchen omeprazole (PRILOSEC) 40 MG capsule Take 1 capsule (40 mg total) by mouth daily.        Review of Systems  Constitutional: Negative.   HENT: Negative.   Eyes: Negative.   Respiratory: Negative.   Cardiovascular: Negative.   Gastrointestinal: Negative.   Musculoskeletal: Positive for back pain, arthralgias and gait problem.  Skin: Negative.   Neurological: Negative.   Hematological: Negative.     Psychiatric/Behavioral: Negative.   All other systems reviewed and are negative.    BP 128/80  Pulse 81  Ht 6\' 1"  (1.854 m)  Wt 226 lb (102.513 kg)  BMI 29.82 kg/m2  Physical Exam  Nursing note and vitals reviewed. Constitutional: He is oriented to person, place, and time. He appears well-developed and well-nourished.  HENT:  Head: Normocephalic.  Nose: Nose normal.  Mouth/Throat: Oropharynx is clear and moist.  Eyes: Conjunctivae are normal. Pupils are equal, round, and reactive to light.  Neck: Normal range of motion. Neck supple. No JVD present.  Cardiovascular: Normal rate, regular rhythm, S1 normal, S2 normal, normal heart sounds and intact distal pulses.  Exam reveals no gallop and no friction rub.   No murmur heard. Pulmonary/Chest: Effort normal and breath sounds normal. No respiratory distress. He has no wheezes. He has no rales. He exhibits no tenderness.  Abdominal: Soft. Bowel sounds are normal. He exhibits no distension. There is no tenderness.  Musculoskeletal: Normal range of motion. He exhibits no edema and no tenderness.  Lymphadenopathy:    He has no cervical adenopathy.  Neurological: He is alert and oriented to person, place, and time. Coordination normal.  Skin: Skin is warm and dry. No rash noted. No erythema.  Psychiatric: He has a normal mood and affect. His behavior is normal. Judgment and thought content normal.  Assessment and Plan

## 2011-10-21 ENCOUNTER — Encounter: Payer: Self-pay | Admitting: Internal Medicine

## 2011-11-25 ENCOUNTER — Other Ambulatory Visit: Payer: Self-pay | Admitting: Cardiology

## 2011-11-25 DIAGNOSIS — I714 Abdominal aortic aneurysm, without rupture, unspecified: Secondary | ICD-10-CM

## 2011-11-29 ENCOUNTER — Encounter (INDEPENDENT_AMBULATORY_CARE_PROVIDER_SITE_OTHER): Payer: Medicare Other

## 2011-11-29 DIAGNOSIS — I714 Abdominal aortic aneurysm, without rupture, unspecified: Secondary | ICD-10-CM

## 2011-12-07 NOTE — Progress Notes (Signed)
Pt informed

## 2012-01-10 ENCOUNTER — Encounter: Payer: Self-pay | Admitting: Internal Medicine

## 2012-01-10 ENCOUNTER — Ambulatory Visit (INDEPENDENT_AMBULATORY_CARE_PROVIDER_SITE_OTHER): Payer: Medicare Other | Admitting: Internal Medicine

## 2012-01-10 VITALS — BP 140/76 | HR 60 | Temp 97.7°F | Resp 16 | Ht 73.0 in | Wt 228.0 lb

## 2012-01-10 DIAGNOSIS — K219 Gastro-esophageal reflux disease without esophagitis: Secondary | ICD-10-CM

## 2012-01-10 DIAGNOSIS — I10 Essential (primary) hypertension: Secondary | ICD-10-CM

## 2012-01-10 DIAGNOSIS — E785 Hyperlipidemia, unspecified: Secondary | ICD-10-CM

## 2012-01-10 DIAGNOSIS — Z0000 Encounter for general adult medical examination without abnormal findings: Secondary | ICD-10-CM

## 2012-01-10 DIAGNOSIS — R972 Elevated prostate specific antigen [PSA]: Secondary | ICD-10-CM

## 2012-01-10 LAB — HEPATIC FUNCTION PANEL
ALT: 18 U/L (ref 0–53)
AST: 21 U/L (ref 0–37)
Albumin: 3.9 g/dL (ref 3.5–5.2)
Alkaline Phosphatase: 38 U/L — ABNORMAL LOW (ref 39–117)
Bilirubin, Direct: 0.1 mg/dL (ref 0.0–0.3)
Total Bilirubin: 0.7 mg/dL (ref 0.3–1.2)
Total Protein: 6.7 g/dL (ref 6.0–8.3)

## 2012-01-10 LAB — BASIC METABOLIC PANEL
BUN: 16 mg/dL (ref 6–23)
CO2: 27 mEq/L (ref 19–32)
Calcium: 9 mg/dL (ref 8.4–10.5)
Chloride: 103 mEq/L (ref 96–112)
Creatinine, Ser: 1.2 mg/dL (ref 0.4–1.5)
GFR: 66.01 mL/min (ref >60.00)
Glucose, Bld: 110 mg/dL — ABNORMAL HIGH (ref 70–99)
Potassium: 4.5 mEq/L (ref 3.5–5.1)
Sodium: 140 mEq/L (ref 135–145)

## 2012-01-10 LAB — CBC WITH DIFFERENTIAL/PLATELET
Basophils Absolute: 0 10*3/uL (ref 0.0–0.1)
Basophils Relative: 0.9 % (ref 0.0–3.0)
Eosinophils Absolute: 0.2 10*3/uL (ref 0.0–0.7)
Eosinophils Relative: 4.7 % (ref 0.0–5.0)
HCT: 42.3 % (ref 39.0–52.0)
Hemoglobin: 14.3 g/dL (ref 13.0–17.0)
Lymphocytes Relative: 34 % (ref 12.0–46.0)
Lymphs Abs: 1.6 10*3/uL (ref 0.7–4.0)
MCHC: 33.7 g/dL (ref 30.0–36.0)
MCV: 96.6 fl (ref 78.0–100.0)
Monocytes Absolute: 0.5 10*3/uL (ref 0.1–1.0)
Monocytes Relative: 10.8 % (ref 3.0–12.0)
Neutro Abs: 2.3 10*3/uL (ref 1.4–7.7)
Neutrophils Relative %: 49.6 % (ref 43.0–77.0)
Platelets: 179 10*3/uL (ref 150.0–400.0)
RBC: 4.38 Mil/uL (ref 4.22–5.81)
RDW: 12.6 % (ref 11.5–14.6)
WBC: 4.6 10*3/uL (ref 4.5–10.5)

## 2012-01-10 LAB — LIPID PANEL
Cholesterol: 143 mg/dL (ref 0–200)
HDL: 37 mg/dL — ABNORMAL LOW (ref >39.00)
LDL Cholesterol: 81 mg/dL (ref 0–99)
Total CHOL/HDL Ratio: 4
Triglycerides: 124 mg/dL (ref 0.0–149.0)
VLDL: 24.8 mg/dL (ref 0.0–40.0)

## 2012-01-10 LAB — TSH: TSH: 2.48 u[IU]/mL (ref 0.35–5.50)

## 2012-01-10 MED ORDER — ROSUVASTATIN CALCIUM 20 MG PO TABS: 20.0000 mg | ORAL_TABLET | ORAL | Status: DC

## 2012-01-10 NOTE — Progress Notes (Signed)
Subjective:    Patient ID: [name], male    DOB: [birth year], 71 y.o.   MRN: [id number]  HPI  Patient presents for yearly wellness examination.  We discussed his overall medical condition including his history of hyperlipidemia hypertension coronary artery disease and peripheral vascular disease.  He had been off of his statin for a while due to intolerance of statins they went back on a half tablet daily he was not it would tolerate this we took a whole tablet every other day he was able to tolerate that however although his HDL and total cholesterol were within range his LDL is greater than 100.  Given his cardiovascular disease and LDL 70 would be ideal  Review of Systems  Constitutional: Negative for fever and fatigue.  HENT: Negative for hearing loss, congestion, neck pain and postnasal drip.   Eyes: Negative for discharge, redness and visual disturbance.  Respiratory: Negative for cough, shortness of breath and wheezing.   Cardiovascular: Negative for leg swelling.  Gastrointestinal: Negative for abdominal pain, constipation and abdominal distention.  Genitourinary: Negative for urgency and frequency.  Musculoskeletal: Negative for joint swelling and arthralgias.  Skin: Negative for color change and rash.  Neurological: Negative for weakness and light-headedness.  Hematological: Negative for adenopathy.  Psychiatric/Behavioral: Negative for behavioral problems.   Past Medical History  Diagnosis Date  . HYPERLIPIDEMIA 04/26/2007  . HYPERTENSION 04/30/2007  . 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 GERD 04/30/2007  . INGUINAL HERNIA, RIGHT 05/26/2010  . BENIGN PROSTATIC HYPERTROPHY, WITH URINARY OBSTRUCTION 09/06/2007  . 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 LOC OSTEOARTHROS NOT SPEC PRIM/SEC LOWER LEG 11/05/2009  . OSTEOARTHRITIS 04/26/2007  . 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 Edema 04/20/2010  . PSA, INCREASED 07/09/2008  . DVT, HX OF 04/26/2007  . Lumbar disc disease with radiculopathy   . Spinal stenosis of lumbar region     History   Social History  . Marital Status: Married    Spouse Name: [name]    Number of Children: [name]  . Years of Education: [name]   Occupational History  . Not on file.   Social History Main Topics  . Smoking status: Former Smoker    Quit date: 08/15/1988  . Smokeless tobacco: Not on file  . Alcohol Use: No  . Drug Use: No  . Sexually Active: Yes   Other Topics Concern  . Not on file   Social History Narrative  . No narrative on file    Past Surgical History  Procedure Date  . Appendectomy   . Coronary artery bypass graft     3vessel  in 1990  . Cataract extraction, bilateral   . Joint replacement     both hips in 2002  . Spine surgery 1984    two ruptured discs/fusion  . Knee arthroscopy     x 2  . Cystoscopy 12/23/10    Family History  Problem Relation Age of Onset  . Stroke Mother   . Heart attack Mother   . Diabetes Mother   . Stroke Father   . Heart disease Father   . Cancer Sister   . Diabetes Brother     Allergies  Allergen Reactions  . Morphine   . Rofecoxib     Current Outpatient Prescriptions on File Prior to Visit  Medication Sig Dispense Refill  . aspirin 81 MG tablet Take 81 mg by mouth  daily.        . finasteride (PROSCAR) 5 MG tablet Take 5 mg by mouth daily.        . nitroGLYCERIN (NITROSTAT) 0.4 MG SL tablet Place 0.4 mg under the tongue every 5 (five) minutes as needed.        Marland Kitchen omeprazole (PRILOSEC) 40 MG capsule Take 1 capsule (40 mg total) by mouth daily.      . rosuvastatin (CRESTOR) 20 MG tablet Take 1 tablet (20 mg total) by mouth 3 (three) times a week.  90 tablet  3    BP 140/76  Pulse 60  Temp 97.7 F (36.5 C)  Resp 16  Ht 6\' 1"  (1.854 m)  Wt 228 lb (103.42 kg)  BMI 30.08 kg/m2       Objective:   Physical Exam  Nursing note and vitals  reviewed. Constitutional: He is oriented to person, place, and time. He appears well-developed and well-nourished.  HENT:  Head: Normocephalic and atraumatic.  Eyes: Conjunctivae are normal. Pupils are equal, round, and reactive to light.  Neck: Normal range of motion. Neck supple.  Cardiovascular: Normal rate and regular rhythm.   Pulmonary/Chest: Effort normal and breath sounds normal.  Abdominal: Soft. Bowel sounds are normal.       Abdominal obesity  Genitourinary:       Exam per urologist  Musculoskeletal: He exhibits edema and tenderness.  Neurological: He is alert and oriented to person, place, and time.  Skin: Skin is warm and dry.          Assessment & Plan:   Patient presents for yearly preventative medicine examination.   all immunizations and health maintenance protocols were reviewed with the patient and they are up to date with these protocols.   screening laboratory values were reviewed with the patient including screening of hyperlipidemia PSA renal function and hepatic function.   There medications past medical history social history problem list and allergies were reviewed in detail.   Goals were established with regard to weight loss exercise diet in compliance with medications  Patient's cholesterol is not at goal given his diagnoses of hyperlipidemia CAD and PAD we will change him to Crestor 20 mg every other day

## 2012-01-10 NOTE — Patient Instructions (Addendum)
The patient is instructed to continue all medications as prescribed. Schedule followup with check out clerk upon leaving the clinic Take the crestoR 20 three times a week Weight loss!!!!

## 2012-04-09 ENCOUNTER — Encounter: Payer: Self-pay | Admitting: Internal Medicine

## 2012-04-09 ENCOUNTER — Ambulatory Visit (INDEPENDENT_AMBULATORY_CARE_PROVIDER_SITE_OTHER): Payer: Medicare Other | Admitting: Internal Medicine

## 2012-04-09 VITALS — BP 136/72 | HR 76 | Temp 98.3°F | Resp 16 | Ht 73.0 in | Wt 225.0 lb

## 2012-04-09 DIAGNOSIS — G4733 Obstructive sleep apnea (adult) (pediatric): Secondary | ICD-10-CM

## 2012-04-09 DIAGNOSIS — Z79899 Other long term (current) drug therapy: Secondary | ICD-10-CM

## 2012-04-09 DIAGNOSIS — G589 Mononeuropathy, unspecified: Secondary | ICD-10-CM

## 2012-04-09 DIAGNOSIS — G629 Polyneuropathy, unspecified: Secondary | ICD-10-CM

## 2012-04-09 DIAGNOSIS — E785 Hyperlipidemia, unspecified: Secondary | ICD-10-CM

## 2012-04-09 LAB — VITAMIN B12: Vitamin B-12: 234 pg/mL (ref 211–911)

## 2012-04-09 MED ORDER — EZETIMIBE-SIMVASTATIN 10-20 MG PO TABS: 1.0000 | ORAL_TABLET | ORAL | Status: DC

## 2012-04-09 NOTE — Patient Instructions (Addendum)
The patient is instructed to continue all medications as prescribed. Schedule followup with check out clerk upon leaving the clinic  

## 2012-04-09 NOTE — Progress Notes (Signed)
Subjective:    Patient ID: [name], male    DOB: Nov 29, 1940, 71 y.o.   MRN: [id number]  HPI Patient is a 71 year old who presents for followup of hyperlipidemia.  We changed his medication from Vytorin to Crestor at the last office visit prior to getting a lipid value because to to previous values showed an LDL greater than 100.  Taking the Vytorin consistently and a limiting his breakfast at [employer] the patient has been able to achieve an LDL of 88 on the Vytorin which was similar to previous records After discussing both formulary and tolerance of medications we agreed that taking the Vytorin and a limiting some of the bad food habits would be a better path forward rather than changing to Crestor at this time.  So we will resume the Vytorin samples and prescription has been called in   Review of Systems  Constitutional: Positive for fatigue. Negative for fever.  HENT: Positive for tinnitus. Negative for hearing loss, congestion, neck pain and postnasal drip.   Eyes: Negative for discharge, redness and visual disturbance.  Respiratory: Positive for shortness of breath. Negative for cough and wheezing.   Cardiovascular: Negative for leg swelling.  Gastrointestinal: Negative for abdominal pain, constipation and abdominal distention.  Genitourinary: Negative for urgency and frequency.  Musculoskeletal: Negative for joint swelling and arthralgias.  Skin: Negative for color change and rash.  Neurological: Positive for weakness. Negative for light-headedness.  Hematological: Negative for adenopathy.  Psychiatric/Behavioral: Negative for behavioral problems.   Past Medical History  Diagnosis Date  . HYPERLIPIDEMIA 04/26/2007  . HYPERTENSION 04/30/2007  . 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 GERD 04/30/2007  . INGUINAL HERNIA, RIGHT 05/26/2010  . BENIGN PROSTATIC HYPERTROPHY,  WITH URINARY OBSTRUCTION 09/06/2007  . 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 LOC OSTEOARTHROS NOT SPEC PRIM/SEC LOWER LEG 11/05/2009  . OSTEOARTHRITIS 04/26/2007  . 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 Edema 04/20/2010  . PSA, INCREASED 07/09/2008  . DVT, HX OF 04/26/2007  . Lumbar disc disease with radiculopathy   . Spinal stenosis of lumbar region     History   Social History  . Marital Status: Married    Spouse Name: [name]    Number of Children: [name]  . Years of Education: [name]   Occupational History  . Not on file.   Social History Main Topics  . Smoking status: Former Smoker    Quit date: 08/15/1988  . Smokeless tobacco: Not on file  . Alcohol Use: No  . Drug Use: No  . Sexually Active: Yes   Other Topics Concern  . Not on file   Social History Narrative  . No narrative on file    Past Surgical History  Procedure Date  . Appendectomy   . Coronary artery bypass graft     3vessel  in 1990  . Cataract extraction, bilateral   . Joint replacement     both hips in 2002  . Spine surgery 1984    two ruptured discs/fusion  . Knee arthroscopy     x 2  . Cystoscopy 12/23/10    Family History  Problem Relation Age of Onset  . Stroke Mother   . Heart attack Mother   . Diabetes Mother   . Stroke Father   . Heart disease Father   . Cancer Sister   . Diabetes Brother     Allergies  Allergen Reactions  .  Morphine   . Rofecoxib     Current Outpatient Prescriptions on File Prior to Visit  Medication Sig Dispense Refill  . aspirin 81 MG tablet Take 81 mg by mouth daily.        . finasteride (PROSCAR) 5 MG tablet Take 5 mg by mouth daily.        . nitroGLYCERIN (NITROSTAT) 0.4 MG SL tablet Place 0.4 mg under the tongue every 5 (five) minutes as needed.        Marland Kitchen omeprazole (PRILOSEC) 40 MG capsule Take 1 capsule (40 mg total) by mouth daily.        BP 136/72  Pulse 76  Temp 98.3 F (36.8 C)  Resp 16  Ht 6\' 1"  (1.854 m)  Wt 225 lb (102.059 kg)  BMI 29.69  kg/m2       Objective:   Physical Exam  Constitutional: He appears well-developed and well-nourished.  HENT:  Head: Normocephalic and atraumatic.  Eyes: Conjunctivae are normal. Pupils are equal, round, and reactive to light.  Neck: Normal range of motion. Neck supple.  Cardiovascular: Normal rate and regular rhythm.   Pulmonary/Chest: Effort normal and breath sounds normal.  Abdominal: Soft. Bowel sounds are normal.  Skin:        Cool extremities          Assessment & Plan:  See HPI for rationale for for resuming vytorin Insomnia... 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

## 2012-04-10 ENCOUNTER — Other Ambulatory Visit: Payer: Self-pay | Admitting: Internal Medicine

## 2012-04-10 DIAGNOSIS — G4730 Sleep apnea, unspecified: Secondary | ICD-10-CM

## 2012-04-19 ENCOUNTER — Encounter: Payer: Self-pay | Admitting: Cardiovascular Disease

## 2012-04-19 ENCOUNTER — Ambulatory Visit (INDEPENDENT_AMBULATORY_CARE_PROVIDER_SITE_OTHER): Payer: Medicare Other | Admitting: Cardiovascular Disease

## 2012-04-19 VITALS — BP 142/76 | HR 56 | Ht 73.0 in | Wt 226.0 lb

## 2012-04-19 DIAGNOSIS — R0989 Other specified symptoms and signs involving the circulatory and respiratory systems: Secondary | ICD-10-CM

## 2012-04-19 DIAGNOSIS — E785 Hyperlipidemia, unspecified: Secondary | ICD-10-CM

## 2012-04-19 DIAGNOSIS — I10 Essential (primary) hypertension: Secondary | ICD-10-CM

## 2012-04-19 DIAGNOSIS — I7389 Other specified peripheral vascular diseases: Secondary | ICD-10-CM

## 2012-04-19 DIAGNOSIS — R42 Dizziness and giddiness: Secondary | ICD-10-CM

## 2012-04-19 DIAGNOSIS — R079 Chest pain, unspecified: Secondary | ICD-10-CM

## 2012-04-19 DIAGNOSIS — R0602 Shortness of breath: Secondary | ICD-10-CM

## 2012-04-19 DIAGNOSIS — I25810 Atherosclerosis of coronary artery bypass graft(s) without angina pectoris: Secondary | ICD-10-CM

## 2012-04-19 MED ORDER — NITROGLYCERIN 0.4 MG SL SUBL: 0.4000 mg | SUBLINGUAL_TABLET | SUBLINGUAL | Status: DC | PRN

## 2012-04-19 NOTE — Assessment & Plan Note (Signed)
Blood pressure is well controlled on today's visit. No changes made to the medications. 

## 2012-04-19 NOTE — Assessment & Plan Note (Signed)
Currently with no symptoms of angina. No further workup at this time. Continue current medication regimen. 

## 2012-04-19 NOTE — Assessment & Plan Note (Signed)
We have suggested he continue to work on his diet and weight. Goal LDL less than 70. Continue Vytorin as tolerated.

## 2012-04-19 NOTE — Patient Instructions (Addendum)
You are doing well. No medication changes were made.  We will schedule you for a carotid ultrasound Ultrasound of aorta in April 2014  Please call us if you have new issues that need to be addressed before your next appt.  Your physician wants you to follow-up in: 6 months.  You will receive a reminder letter in the mail two months in advance. If you don't receive a letter, please call our office to schedule the follow-up appointment.

## 2012-04-19 NOTE — Assessment & Plan Note (Signed)
Repeat aortic ultrasound in 2014, he would like carotid ultrasound given dizziness symptoms recently.

## 2012-04-19 NOTE — Progress Notes (Signed)
Patient ID: [name], male    DOB: 07-02-1941, 71 y.o.   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 He has a triple-A less than 3 cm, mild bilateral carotid arterial disease several years ago.   He reports that he is doing well. He is active, but does not participate in an exercise plan secondary to his hips.    He takes Vytorin 10/20  every other day secondary to a history of muscle ache.  He tried Crestor for several months but did not like side effects from this medication and has changed back to Vytorin . He has stopped eating his desk it every morning and is working on his diet . He does have occasional indigestion, occasional chest pain that is short lived. He does not take nitroglycerin for these symptoms. Occasional dizziness which is rare. His other problems include peripheral vascular disease with bilateral indices of 0.85.   EKG shows normal sinus rhythm with rate 56 beats per minute, left axis deviation, no significant ST or T wave changes      Outpatient Encounter Prescriptions as of 04/19/2012  Medication Sig Dispense Refill  . aspirin 81 MG tablet Take 81 mg by mouth daily.        Marland Kitchen ezetimibe-simvastatin (VYTORIN) 10-20 MG per tablet Take 1 tablet by mouth every other day.  30 tablet  11  . finasteride (PROSCAR) 5 MG tablet Take 5 mg by mouth daily.        . nitroGLYCERIN (NITROSTAT) 0.4 MG SL tablet Place 1 tablet (0.4 mg total) under the tongue every 5 (five) minutes as needed.  25 tablet  6  . omeprazole (PRILOSEC) 40 MG capsule Take 1 capsule (40 mg total) by mouth daily.      Marland Kitchen DISCONTD: rosuvastatin (CRESTOR) 20 MG tablet Take 1 tablet (20 mg total) by mouth 3 (three) times a week.  90 tablet  3    Review of Systems  Constitutional: Negative.   HENT: Negative.     Eyes: Negative.   Respiratory: Negative.   Cardiovascular: Negative.   Gastrointestinal: Negative.   Musculoskeletal: Positive for back pain, arthralgias and gait problem.  Skin: Negative.   Neurological: Negative.   Hematological: Negative.   Psychiatric/Behavioral: Negative.   All other systems reviewed and are negative.    BP 142/76  Pulse 56  Ht 6\' 1"  (1.854 m)  Wt 226 lb (102.513 kg)  BMI 29.82 kg/m2  Physical Exam  Nursing note and vitals reviewed. Constitutional: He is oriented to person, place, and time. He appears well-developed and well-nourished.  HENT:  Head: Normocephalic.  Nose: Nose normal.  Mouth/Throat: Oropharynx is clear and moist.  Eyes: Conjunctivae are normal. Pupils are equal, round, and reactive to light.  Neck: Normal range of motion. Neck supple. No JVD present.  Cardiovascular: Normal rate, regular rhythm, S1 normal, S2 normal, normal heart sounds and intact distal pulses.  Exam reveals no gallop and no friction rub.   No murmur heard. Pulmonary/Chest: Effort normal and breath sounds normal. No respiratory distress. He has no wheezes. He has no rales. He exhibits no tenderness.  Abdominal: Soft. Bowel sounds are normal. He exhibits no distension. There is no tenderness.  Musculoskeletal: Normal range of motion. He exhibits no edema and no tenderness.  Lymphadenopathy:    He has no cervical adenopathy.  Neurological: He is alert and oriented to person, place, and time. Coordination normal.  Skin: Skin is warm and dry. No rash noted. No erythema.  Psychiatric: He has a normal mood and affect. His behavior is normal. Judgment and thought content normal.           Assessment and Plan

## 2012-04-26 ENCOUNTER — Encounter (INDEPENDENT_AMBULATORY_CARE_PROVIDER_SITE_OTHER): Payer: Medicare Other

## 2012-04-26 DIAGNOSIS — R0989 Other specified symptoms and signs involving the circulatory and respiratory systems: Secondary | ICD-10-CM

## 2012-04-26 DIAGNOSIS — R42 Dizziness and giddiness: Secondary | ICD-10-CM

## 2012-04-26 DIAGNOSIS — I6529 Occlusion and stenosis of unspecified carotid artery: Secondary | ICD-10-CM

## 2012-05-01 ENCOUNTER — Ambulatory Visit (HOSPITAL_BASED_OUTPATIENT_CLINIC_OR_DEPARTMENT_OTHER): Payer: Medicare Other | Attending: Internal Medicine | Admitting: Radiology

## 2012-05-01 VITALS — Ht 73.0 in | Wt 216.0 lb

## 2012-05-01 DIAGNOSIS — G4730 Sleep apnea, unspecified: Secondary | ICD-10-CM

## 2012-05-01 DIAGNOSIS — G4733 Obstructive sleep apnea (adult) (pediatric): Secondary | ICD-10-CM | POA: Insufficient documentation

## 2012-05-08 ENCOUNTER — Other Ambulatory Visit: Payer: Self-pay

## 2012-05-08 DIAGNOSIS — E785 Hyperlipidemia, unspecified: Secondary | ICD-10-CM

## 2012-05-08 MED ORDER — EZETIMIBE-SIMVASTATIN 10-20 MG PO TABS: 1.0000 | ORAL_TABLET | Freq: Every day | ORAL | Status: DC

## 2012-05-11 DIAGNOSIS — G4730 Sleep apnea, unspecified: Secondary | ICD-10-CM

## 2012-05-11 DIAGNOSIS — G4710 Hypersomnia, unspecified: Secondary | ICD-10-CM

## 2012-05-11 NOTE — Procedures (Signed)
NAMEMarland [name]  PHUC, KLUTTZ NO.:  [id number]  MEDICAL RECORD NO.:  [id number]          PATIENT TYPE:  OUT  LOCATION:  SLEEP CENTER                 FACILITY:  [facility]  PHYSICIAN:  Barbaraann Share, MD,FCCPDATE OF BIRTH:  04/12/41  DATE OF STUDY:  05/01/2012                           NOCTURNAL POLYSOMNOGRAM  REFERRING PHYSICIAN:  Stacie Glaze, MD  REFERRING PHYSICIAN:  Stacie Glaze, MD  INDICATION FOR STUDY:  Hypersomnia with sleep apnea.  EPWORTH SLEEPINESS SCORE:  14.  SLEEP ARCHITECTURE:  The patient had a total sleep time of 283 minutes with no slow-wave sleep and only 31 minutes of REM.  Sleep onset latency was normal at 27 minutes and REM onset was delayed.  Sleep efficiency was 55% during the diagnostic portion of the study and 88% during the titration portion of the study.  RESPIRATORY DATA:  The patient underwent a split night protocol where he was found to have 38 obstructive events in the first 122 minutes of sleep.  This gave him an apnea/hypopnea index during the diagnostic portion of the study of 19 events per hour.  The events occurred in all body positions and there was moderate snoring noted throughout.  By protocol, the patient was fitted with a medium ResMed Quattro full face mask, and CPAP titration was initiated.  At a final pressure of 9 cm of water, the patient had good control of his obstructive events even through supine REM.  OXYGEN DATA:  There was O2 desaturation as low as 86% with the patient's obstructive events.  CARDIAC DATA:  Occasional PVC noted, but no clinically significant arrhythmias were seen.  MOVEMENT/PARASOMNIA:  The patient had very large numbers of periodic limb movements, however, there was very little sleep disruption.  There were no abnormal behaviors noted.  IMPRESSION/RECOMMENDATION: 1. 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  The     patient was then fitted with a medium ResMed Quattro full face     mask, and found to have an optimal treatment pressure of 9 cm of     water.  Given the mild nature of the patient's sleep apnea,     consideration can also be given to other treatment options such as     a trial of weight loss alone, upper airway surgery, dental     appliance.  Clinical correlation is suggested. 2. Occasional premature ventricular contraction noted, but no     clinically significant arrhythmias were seen.  3.  Large numbers of leg jerks noted, however with very little sleep disruption.  It is unclear if these are secondary to the patient's sleep-disordered breathing, or whether he may have a concomitant movement disorder of sleep.  Consideration can also be given to chronic pain.  Again, clinical correlation is suggested.     Barbaraann Share, MD,FCCP Diplomate, American Board of Sleep Medicine    [facility]  D:  05/11/2012 08:06:14  T:  05/11/2012 23:09:17  Job:  [id number]

## 2012-05-22 ENCOUNTER — Other Ambulatory Visit: Payer: Self-pay | Admitting: Internal Medicine

## 2012-05-22 DIAGNOSIS — G4730 Sleep apnea, unspecified: Secondary | ICD-10-CM

## 2012-06-29 ENCOUNTER — Telehealth: Payer: Self-pay | Admitting: Internal Medicine

## 2012-06-29 MED ORDER — AZITHROMYCIN 250 MG PO TABS: ORAL_TABLET | ORAL | Status: DC

## 2012-06-29 NOTE — Telephone Encounter (Signed)
Caller: [name]/Patient; Patient Name: [name]; PCP: Darryll Capers (Adults only); Best Callback Phone Number: [phone]. Patient states he has a sinus infection and sore throat for two days 05/27/12 , along with right ear pain. + green nasal drainage. Afebrile. He states his throat is red and he saw a patch in the back. Tylenol for headache. He is requesting an antibiotic.  He lives an hour from the office and declines appointment, he is just finishing getting his CPaP mask changed.  Emergent s/sx ruled out per Sore throat Protocol with exception to " Throat red and white patch in back of throat". See prover in 24 hours. CALLER REQUEST ANTIBIOTIC, lives an hour away. Please contact if no medication will be sent and he will make the drive. Home care advise and call back parameters reviewed. Understanding expressed.

## 2012-06-29 NOTE — Telephone Encounter (Signed)
zpack per dr [name]- pt informed and sent to pharmacy

## 2012-07-10 ENCOUNTER — Encounter: Payer: Self-pay | Admitting: Internal Medicine

## 2012-07-10 ENCOUNTER — Ambulatory Visit (INDEPENDENT_AMBULATORY_CARE_PROVIDER_SITE_OTHER): Payer: Medicare Other | Admitting: Internal Medicine

## 2012-07-10 ENCOUNTER — Ambulatory Visit: Payer: Medicare Other | Admitting: Internal Medicine

## 2012-07-10 VITALS — BP 140/74 | HR 72 | Temp 98.2°F | Resp 16 | Ht 73.0 in | Wt 210.0 lb

## 2012-07-10 DIAGNOSIS — I10 Essential (primary) hypertension: Secondary | ICD-10-CM

## 2012-07-10 DIAGNOSIS — E785 Hyperlipidemia, unspecified: Secondary | ICD-10-CM

## 2012-07-10 DIAGNOSIS — Z1331 Encounter for screening for depression: Secondary | ICD-10-CM

## 2012-07-10 DIAGNOSIS — K219 Gastro-esophageal reflux disease without esophagitis: Secondary | ICD-10-CM

## 2012-07-10 DIAGNOSIS — R0681 Apnea, not elsewhere classified: Secondary | ICD-10-CM

## 2012-07-10 LAB — COMPREHENSIVE METABOLIC PANEL
ALT: 16 U/L (ref 0–53)
AST: 19 U/L (ref 0–37)
Albumin: 4.2 g/dL (ref 3.5–5.2)
Alkaline Phosphatase: 37 U/L — ABNORMAL LOW (ref 39–117)
BUN: 23 mg/dL (ref 6–23)
CO2: 26 mEq/L (ref 19–32)
Calcium: 9.2 mg/dL (ref 8.4–10.5)
Chloride: 102 mEq/L (ref 96–112)
Creatinine, Ser: 1.1 mg/dL (ref 0.4–1.5)
GFR: 70.08 mL/min (ref >60.00)
Glucose, Bld: 110 mg/dL — ABNORMAL HIGH (ref 70–99)
Potassium: 4.2 mEq/L (ref 3.5–5.1)
Sodium: 136 mEq/L (ref 135–145)
Total Bilirubin: 1 mg/dL (ref 0.3–1.2)
Total Protein: 7.1 g/dL (ref 6.0–8.3)

## 2012-07-10 LAB — LIPID PANEL
Cholesterol: 157 mg/dL (ref 0–200)
HDL: 37.1 mg/dL — ABNORMAL LOW (ref >39.00)
LDL Cholesterol: 97 mg/dL (ref 0–99)
Total CHOL/HDL Ratio: 4
Triglycerides: 114 mg/dL (ref 0.0–149.0)
VLDL: 22.8 mg/dL (ref 0.0–40.0)

## 2012-07-10 NOTE — Patient Instructions (Addendum)
The patient is instructed to continue all medications as prescribed. Schedule followup with check out clerk upon leaving the clinic  

## 2012-07-10 NOTE — Progress Notes (Signed)
Subjective:    Patient ID: [name], male    DOB: 07-30-1941, 71 y.o.   MRN: [id number]  HPI    Review of Systems  Constitutional: Negative for fever and fatigue.  HENT: Positive for congestion and rhinorrhea. Negative for hearing loss, neck pain and postnasal drip.   Eyes: Negative for discharge, redness and visual disturbance.  Respiratory: Negative for cough, shortness of breath and wheezing.   Cardiovascular: Negative for leg swelling.  Gastrointestinal: Negative for abdominal pain, constipation and abdominal distention.  Genitourinary: Negative for urgency and frequency.  Musculoskeletal: Positive for myalgias and joint swelling. Negative for arthralgias.  Skin: Negative for color change and rash.  Neurological: Positive for weakness. Negative for light-headedness.  Hematological: Negative for adenopathy.  Psychiatric/Behavioral: Negative for behavioral problems.   Past Medical History  Diagnosis Date  . HYPERLIPIDEMIA 04/26/2007  . HYPERTENSION 04/30/2007  . 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 GERD 04/30/2007  . INGUINAL HERNIA, RIGHT 05/26/2010  . BENIGN PROSTATIC HYPERTROPHY, WITH URINARY OBSTRUCTION 09/06/2007  . 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 LOC OSTEOARTHROS NOT SPEC PRIM/SEC LOWER LEG 11/05/2009  . OSTEOARTHRITIS 04/26/2007  . 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 Edema 04/20/2010  . PSA, INCREASED 07/09/2008  . DVT, HX OF 04/26/2007  . Lumbar disc disease with radiculopathy   . Spinal stenosis of lumbar region     History   Social History  . Marital Status: Married    Spouse Name: [name]    Number of Children: [name]  . Years of Education: [name]   Occupational History  . Not on file.   Social History Main Topics  . Smoking status: Former Smoker    Quit date: 08/15/1988  . Smokeless tobacco: Not on file  . Alcohol Use: No  . Drug Use: No  .  Sexually Active: Yes   Other Topics Concern  . Not on file   Social History Narrative  . No narrative on file    Past Surgical History  Procedure Date  . Appendectomy   . Coronary artery bypass graft     3vessel  in 1990  . Cataract extraction, bilateral   . Joint replacement     both hips in 2002  . Spine surgery 1984    two ruptured discs/fusion  . Knee arthroscopy     x 2  . Cystoscopy 12/23/10    Family History  Problem Relation Age of Onset  . Stroke Mother   . Heart attack Mother   . Diabetes Mother   . Stroke Father   . Heart disease Father   . Cancer Sister   . Diabetes Brother     Allergies  Allergen Reactions  . Morphine   . Rofecoxib     Current Outpatient Prescriptions on File Prior to Visit  Medication Sig Dispense Refill  . aspirin 81 MG tablet Take 81 mg by mouth daily.        Marland Kitchen ezetimibe-simvastatin (VYTORIN) 10-20 MG per tablet Take 1 tablet by mouth at bedtime.  30 tablet  11  . finasteride (PROSCAR) 5 MG tablet Take 5 mg by mouth daily.        . nitroGLYCERIN (NITROSTAT) 0.4 MG SL tablet Place 1 tablet (0.4 mg total) under the tongue every 5 (five) minutes as needed.  25 tablet  6  . omeprazole (PRILOSEC) 40 MG capsule Take 1 capsule (40 mg total) by mouth  daily.        BP 140/74  Pulse 72  Temp 98.2 F (36.8 C)  Resp 16  Ht 6\' 1"  (1.854 m)  Wt 210 lb (95.255 kg)  BMI 27.71 kg/m2       Objective:   Physical Exam  Nursing note and vitals reviewed. Constitutional: He appears well-developed and well-nourished.  HENT:  Head: Normocephalic and atraumatic.  Eyes: Conjunctivae normal are normal. Pupils are equal, round, and reactive to light.  Neck: Normal range of motion. Neck supple.  Cardiovascular: Normal rate and regular rhythm.   Pulmonary/Chest: Effort normal and breath sounds normal.  Abdominal: Soft. Bowel sounds are normal.          Assessment & Plan:  Monitoring for control of lipids as well as a basic metabolic  panel for renal function and basic screening for blood sugar.  Patient has a history of CAD status post CABG Weight loss has probably helped his apnea symptoms in the titration he needed from 5-9 cm pressure With a 20 pound weight loss has been therapeutic

## 2012-08-15 DIAGNOSIS — N4340 Spermatocele of epididymis, unspecified: Secondary | ICD-10-CM

## 2012-08-15 HISTORY — DX: Spermatocele of epididymis, unspecified: N43.40

## 2012-09-29 ENCOUNTER — Other Ambulatory Visit: Payer: Self-pay

## 2012-10-23 ENCOUNTER — Ambulatory Visit (INDEPENDENT_AMBULATORY_CARE_PROVIDER_SITE_OTHER): Payer: Medicare Other | Admitting: Cardiovascular Disease

## 2012-10-23 ENCOUNTER — Encounter: Payer: Self-pay | Admitting: Cardiovascular Disease

## 2012-10-23 VITALS — BP 140/74 | HR 74 | Ht 73.0 in | Wt 204.8 lb

## 2012-10-23 DIAGNOSIS — I6523 Occlusion and stenosis of bilateral carotid arteries: Secondary | ICD-10-CM

## 2012-10-23 DIAGNOSIS — I714 Abdominal aortic aneurysm, without rupture, unspecified: Secondary | ICD-10-CM

## 2012-10-23 DIAGNOSIS — E785 Hyperlipidemia, unspecified: Secondary | ICD-10-CM

## 2012-10-23 DIAGNOSIS — I658 Occlusion and stenosis of other precerebral arteries: Secondary | ICD-10-CM

## 2012-10-23 DIAGNOSIS — I25810 Atherosclerosis of coronary artery bypass graft(s) without angina pectoris: Secondary | ICD-10-CM

## 2012-10-23 DIAGNOSIS — I6529 Occlusion and stenosis of unspecified carotid artery: Secondary | ICD-10-CM

## 2012-10-23 NOTE — Assessment & Plan Note (Signed)
Cholesterol above goal. We have discussed the goal LDL is less than 70.he'll continue on Vytorin

## 2012-10-23 NOTE — Assessment & Plan Note (Signed)
Currently with no symptoms of angina. No further workup at this time. Continue current medication regimen. 

## 2012-10-23 NOTE — Assessment & Plan Note (Signed)
40-59% disease. Will schedule for repeat study September 2014. Continue aggressive cholesterol management

## 2012-10-23 NOTE — Progress Notes (Signed)
Patient ID: [name], male    DOB: 1940/08/27, 72 y.o.   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 He has a AAA less than 3 cm, mild bilateral carotid arterial disease several years ago.   He reports that he is doing well. He is active, but does not participate in an exercise plan secondary to his hips.  He takes Vytorin 10/20  every other day secondary to a history of muscle ache.  He tried Crestor for several months but did not like side effects from this medication and has changed back to Vytorin . He has lost more than 20 pounds since his last clinic visit by following a strict diet. No recent episodes of chest pain  He has a diagnosis of obstructive sleep apnea. Could not handle CPAP despite numerous attempts. Symptoms better after weight loss  His other problems include peripheral vascular disease with bilateral indices of 0.85.  Total cholesterol 157, LDL 97 prior to weight loss  EKG shows normal sinus rhythm with rate 74 beats per minute, left axis deviation, no significant ST or T wave changes      Outpatient Encounter Prescriptions as of 10/23/2012  Medication Sig Dispense Refill  . aspirin 81 MG tablet Take 81 mg by mouth daily.        Marland Kitchen ezetimibe-simvastatin (VYTORIN) 10-20 MG per tablet Take 1 tablet by mouth every other day.      . finasteride (PROSCAR) 5 MG tablet Take 5 mg by mouth daily.        . nitroGLYCERIN (NITROSTAT) 0.4 MG SL tablet Place 1 tablet (0.4 mg total) under the tongue every 5 (five) minutes as needed.  25 tablet  6  . omeprazole (PRILOSEC) 40 MG capsule Take 1 capsule (40 mg total) by mouth daily.      . [DISCONTINUED] ezetimibe-simvastatin (VYTORIN) 10-20 MG per tablet Take 1 tablet by mouth at bedtime.  30 tablet  11   No facility-administered  encounter medications on file as of 10/23/2012.    Review of Systems  Constitutional: Negative.   HENT: Negative.   Eyes: Negative.   Respiratory: Negative.   Cardiovascular: Negative.   Gastrointestinal: Negative.   Musculoskeletal: Positive for back pain, arthralgias and gait problem.  Skin: Negative.   Neurological: Negative.   Psychiatric/Behavioral: Negative.   All other systems reviewed and are negative.    BP 140/74  Pulse 74  Ht 6\' 1"  (1.854 m)  Wt 204 lb 12 oz (92.874 kg)  BMI 27.02 kg/m2  Physical Exam  Nursing note and vitals reviewed. Constitutional: He is oriented to person, place, and time. He appears well-developed and well-nourished.  HENT:  Head: Normocephalic.  Nose: Nose normal.  Mouth/Throat: Oropharynx is clear and moist.  Eyes: Conjunctivae are normal. Pupils are equal, round, and reactive to light.  Neck: Normal range of motion. Neck supple. No JVD present.  Cardiovascular: Normal rate, regular rhythm, S1 normal, S2 normal, normal heart sounds and intact distal pulses.  Exam reveals no gallop and no friction rub.   No murmur heard. Pulmonary/Chest: Effort normal and breath sounds normal. No respiratory distress. He has no wheezes. He has no rales. He exhibits no tenderness.  Abdominal: Soft. Bowel sounds are normal. He exhibits no distension. There is no tenderness.  Musculoskeletal: Normal range of motion. He exhibits no edema  and no tenderness.  Lymphadenopathy:    He has no cervical adenopathy.  Neurological: He is alert and oriented to person, place, and time. Coordination normal.  Skin: Skin is warm and dry. No rash noted. No erythema.  Psychiatric: He has a normal mood and affect. His behavior is normal. Judgment and thought content normal.      Assessment and Plan

## 2012-10-23 NOTE — Patient Instructions (Addendum)
You are doing well. No medication changes were made.  Please call us if you have new issues that need to be addressed before your next appt.  Your physician wants you to follow-up in: 6 months.  You will receive a reminder letter in the mail two months in advance. If you don't receive a letter, please call our office to schedule the follow-up appointment.   

## 2012-11-09 ENCOUNTER — Ambulatory Visit (INDEPENDENT_AMBULATORY_CARE_PROVIDER_SITE_OTHER): Payer: Medicare Other | Admitting: Internal Medicine

## 2012-11-09 ENCOUNTER — Encounter: Payer: Self-pay | Admitting: Internal Medicine

## 2012-11-09 VITALS — BP 130/80 | HR 72 | Temp 98.0°F | Resp 16 | Ht 73.0 in | Wt 204.0 lb

## 2012-11-09 DIAGNOSIS — N411 Chronic prostatitis: Secondary | ICD-10-CM

## 2012-11-09 DIAGNOSIS — E785 Hyperlipidemia, unspecified: Secondary | ICD-10-CM

## 2012-11-09 DIAGNOSIS — I10 Essential (primary) hypertension: Secondary | ICD-10-CM

## 2012-11-09 LAB — LIPID PANEL
Cholesterol: 201 mg/dL — ABNORMAL HIGH (ref 0–200)
HDL: 39.6 mg/dL (ref >39.00)
Total CHOL/HDL Ratio: 5
Triglycerides: 104 mg/dL (ref 0.0–149.0)
VLDL: 20.8 mg/dL (ref 0.0–40.0)

## 2012-11-09 LAB — HEPATIC FUNCTION PANEL
ALT: 16 U/L (ref 0–53)
AST: 18 U/L (ref 0–37)
Albumin: 4.2 g/dL (ref 3.5–5.2)
Alkaline Phosphatase: 42 U/L (ref 39–117)
Bilirubin, Direct: 0.2 mg/dL (ref 0.0–0.3)
Total Bilirubin: 0.8 mg/dL (ref 0.3–1.2)
Total Protein: 6.9 g/dL (ref 6.0–8.3)

## 2012-11-09 LAB — PSA: PSA: 0.64 ng/mL (ref 0.10–4.00)

## 2012-11-09 LAB — LDL CHOLESTEROL, DIRECT: Direct LDL: 150 mg/dL

## 2012-11-09 NOTE — Progress Notes (Signed)
Subjective:    Patient ID: [name], male    DOB: Dec 14, 1940, 72 y.o.   MRN: [id number]  HPI  Patient is a 72 year old who is followed for hypertension gastroesophageal reflux a history of aortic aneurysm monitored by cardiology and coronary artery disease monitored by cardiology currently stable.  He has lost a significant weight loading his waist size as well as his risk profile by doing this.    Review of Systems  Constitutional: Positive for appetite change. Negative for fever and fatigue.  HENT: Negative for hearing loss, congestion, neck pain and postnasal drip.   Eyes: Negative for discharge, redness and visual disturbance.  Respiratory: Negative for cough, shortness of breath and wheezing.   Cardiovascular: Negative for leg swelling.  Gastrointestinal: Negative for abdominal pain, constipation and abdominal distention.  Genitourinary: Negative for urgency and frequency.  Musculoskeletal: Negative for joint swelling and arthralgias.  Skin: Negative for color change and rash.  Neurological: Negative for weakness and light-headedness.  Hematological: Negative for adenopathy.  Psychiatric/Behavioral: Negative for behavioral problems.   Past Medical History  Diagnosis Date  . HYPERLIPIDEMIA 04/26/2007  . HYPERTENSION 04/30/2007  . 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 GERD 04/30/2007  . INGUINAL HERNIA, RIGHT 05/26/2010  . BENIGN PROSTATIC HYPERTROPHY, WITH URINARY OBSTRUCTION 09/06/2007  . 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 LOC OSTEOARTHROS NOT SPEC PRIM/SEC LOWER LEG 11/05/2009  . OSTEOARTHRITIS 04/26/2007  . 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 Edema 04/20/2010  . PSA, INCREASED 07/09/2008  . DVT, HX OF 04/26/2007  . Lumbar disc disease with radiculopathy   . Spinal stenosis of lumbar region     History   Social History  . Marital Status: Married   Spouse Name: [name]    Number of Children: [name]  . Years of Education: [name]   Occupational History  . Not on file.   Social History Main Topics  . Smoking status: Former Smoker    Quit date: 08/15/1988  . Smokeless tobacco: Not on file  . Alcohol Use: No  . Drug Use: No  . Sexually Active: Yes   Other Topics Concern  . Not on file   Social History Narrative  . No narrative on file    Past Surgical History  Procedure Laterality Date  . Appendectomy    . Coronary artery bypass graft      3vessel  in 1990  . Cataract extraction, bilateral    . Joint replacement      both hips in 2002  . Spine surgery  1984    two ruptured discs/fusion  . Knee arthroscopy      x 2  . Cystoscopy  12/23/10    Family History  Problem Relation Age of Onset  . Stroke Mother   . Heart attack Mother   . Diabetes Mother   . Stroke Father   . Heart disease Father   . Cancer Sister   . Diabetes Brother     Allergies  Allergen Reactions  . Morphine   . Rofecoxib     Current Outpatient Prescriptions on File Prior to Visit  Medication Sig Dispense Refill  . aspirin 81 MG tablet Take 81 mg by mouth daily.        Marland Kitchen ezetimibe-simvastatin (VYTORIN) 10-20 MG per tablet Take 1 tablet by mouth every other day.      . finasteride (PROSCAR) 5 MG tablet Take 5 mg by  mouth daily.        . nitroGLYCERIN (NITROSTAT) 0.4 MG SL tablet Place 1 tablet (0.4 mg total) under the tongue every 5 (five) minutes as needed.  25 tablet  6  . omeprazole (PRILOSEC) 40 MG capsule Take 1 capsule (40 mg total) by mouth daily.       No current facility-administered medications on file prior to visit.    BP 130/80  Pulse 72  Temp(Src) 98 F (36.7 C)  Resp 16  Ht 6\' 1"  (1.854 m)  Wt 204 lb (92.534 kg)  BMI 26.92 kg/m2       Objective:   Physical Exam  Nursing note and vitals reviewed. Constitutional: He appears well-developed and well-nourished.  HENT:  Head: Normocephalic and atraumatic.  Eyes:  Conjunctivae are normal. Pupils are equal, round, and reactive to light.  Neck: Normal range of motion. Neck supple.  Cardiovascular: Normal rate and regular rhythm.   Murmur heard. Pulmonary/Chest: Effort normal and breath sounds normal.  Abdominal: Soft. Bowel sounds are normal.  Musculoskeletal: He exhibits edema. He exhibits no tenderness.  Skin: Skin is warm and dry.          Assessment & Plan:  Stable blood pressure with excellent risk reduction with weight loss and exercise Monitoring chronic issues Lipid and liver due Psa? disucssed issues Monitor PSA

## 2012-11-09 NOTE — Patient Instructions (Signed)
The patient is instructed to continue all medications as prescribed. Schedule followup with check out clerk upon leaving the clinic  

## 2012-12-05 ENCOUNTER — Encounter (INDEPENDENT_AMBULATORY_CARE_PROVIDER_SITE_OTHER): Payer: Medicare Other

## 2012-12-05 DIAGNOSIS — I714 Abdominal aortic aneurysm, without rupture, unspecified: Secondary | ICD-10-CM

## 2013-02-11 ENCOUNTER — Emergency Department: Payer: Self-pay | Admitting: Unknown Physician Specialty

## 2013-02-19 ENCOUNTER — Emergency Department: Payer: Self-pay | Admitting: Emergency Medicine

## 2013-03-11 ENCOUNTER — Ambulatory Visit (INDEPENDENT_AMBULATORY_CARE_PROVIDER_SITE_OTHER): Payer: Medicare Other | Admitting: Internal Medicine

## 2013-03-11 ENCOUNTER — Encounter: Payer: Self-pay | Admitting: Internal Medicine

## 2013-03-11 VITALS — BP 130/70 | HR 58 | Temp 98.2°F | Resp 16 | Ht 73.0 in | Wt 205.0 lb

## 2013-03-11 DIAGNOSIS — E785 Hyperlipidemia, unspecified: Secondary | ICD-10-CM

## 2013-03-11 DIAGNOSIS — K219 Gastro-esophageal reflux disease without esophagitis: Secondary | ICD-10-CM

## 2013-03-11 DIAGNOSIS — I10 Essential (primary) hypertension: Secondary | ICD-10-CM

## 2013-03-11 MED ORDER — EZETIMIBE-SIMVASTATIN 10-40 MG PO TABS: 1.0000 | ORAL_TABLET | Freq: Every day | ORAL | Status: DC

## 2013-03-11 NOTE — Progress Notes (Signed)
Subjective:    Patient ID: [name], male    DOB: 27-May-1941, 72 y.o.   MRN: [id number]  HPI Monitoring for lipid history of BPH and for hypertension.  Blood pressure stable on current medications.  Lipid profile showed an increase in total cholesterol and LDL cholesterol with HDL cholesterol triglycerides being stable.  Patient has had a dietary change and previously we did note some variation in total cholesterol with timing of blood work.     Review of Systems  Constitutional: Negative.   Eyes: Negative.   Respiratory: Negative.   Cardiovascular: Negative.   Gastrointestinal: Positive for abdominal distention.  Genitourinary: Positive for frequency.  Hematological: Bruises/bleeds easily.   Past Medical History  Diagnosis Date  . HYPERLIPIDEMIA 04/26/2007  . HYPERTENSION 04/30/2007  . 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 GERD 04/30/2007  . INGUINAL HERNIA, RIGHT 05/26/2010  . BENIGN PROSTATIC HYPERTROPHY, WITH URINARY OBSTRUCTION 09/06/2007  . 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 LOC OSTEOARTHROS NOT SPEC PRIM/SEC LOWER LEG 11/05/2009  . OSTEOARTHRITIS 04/26/2007  . 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 Edema 04/20/2010  . PSA, INCREASED 07/09/2008  . DVT, HX OF 04/26/2007  . Lumbar disc disease with radiculopathy   . Spinal stenosis of lumbar region     History   Social History  . Marital Status: Married    Spouse Name: [name]    Number of Children: [name]  . Years of Education: [name]   Occupational History  . Not on file.   Social History Main Topics  . Smoking status: Former Smoker    Quit date: 08/15/1988  . Smokeless tobacco: Not on file  . Alcohol Use: No  . Drug Use: No  . Sexually Active: Yes   Other Topics Concern  . Not on file   Social History Narrative  . No narrative on file    Past Surgical History  Procedure Laterality Date  .  Appendectomy    . Coronary artery bypass graft      3vessel  in 1990  . Cataract extraction, bilateral    . Joint replacement      both hips in 2002  . Spine surgery  1984    two ruptured discs/fusion  . Knee arthroscopy      x 2  . Cystoscopy  12/23/10    Family History  Problem Relation Age of Onset  . Stroke Mother   . Heart attack Mother   . Diabetes Mother   . Stroke Father   . Heart disease Father   . Cancer Sister   . Diabetes Brother     Allergies  Allergen Reactions  . Morphine   . Rofecoxib     Current Outpatient Prescriptions on File Prior to Visit  Medication Sig Dispense Refill  . aspirin 81 MG tablet Take 81 mg by mouth daily.        Marland Kitchen ezetimibe-simvastatin (VYTORIN) 10-20 MG per tablet Take 1 tablet by mouth every other day.      . finasteride (PROSCAR) 5 MG tablet Take 5 mg by mouth daily.        . nitroGLYCERIN (NITROSTAT) 0.4 MG SL tablet Place 1 tablet (0.4 mg total) under the tongue every 5 (five) minutes as needed.  25 tablet  6  . omeprazole (PRILOSEC) 40 MG capsule Take 1 capsule (40 mg total) by mouth daily.       No current  facility-administered medications on file prior to visit.    BP 130/70  Pulse 58  Temp(Src) 98.2 F (36.8 C)  Resp 16  Ht 6\' 1"  (1.854 m)  Wt 205 lb (92.987 kg)  BMI 27.05 kg/m2        Objective:   Physical Exam  Nursing note and vitals reviewed. Constitutional: He appears well-developed and well-nourished.  HENT:  Head: Normocephalic and atraumatic.  Eyes: Conjunctivae are normal. Pupils are equal, round, and reactive to light.  Neck: Normal range of motion. Neck supple.  Cardiovascular: Normal rate and regular rhythm.   Pulmonary/Chest: Effort normal and breath sounds normal.  Abdominal: Soft. Bowel sounds are normal.          Assessment & Plan:  Weight loss and  HTN stable Lipid management and discussion of the vytorin

## 2013-04-02 DIAGNOSIS — E291 Testicular hypofunction: Secondary | ICD-10-CM | POA: Insufficient documentation

## 2013-04-02 DIAGNOSIS — N4340 Spermatocele of epididymis, unspecified: Secondary | ICD-10-CM | POA: Insufficient documentation

## 2013-05-08 ENCOUNTER — Encounter (INDEPENDENT_AMBULATORY_CARE_PROVIDER_SITE_OTHER): Payer: Medicare Other

## 2013-05-08 DIAGNOSIS — I6529 Occlusion and stenosis of unspecified carotid artery: Secondary | ICD-10-CM

## 2013-05-08 DIAGNOSIS — I6523 Occlusion and stenosis of bilateral carotid arteries: Secondary | ICD-10-CM

## 2013-05-10 ENCOUNTER — Telehealth: Payer: Self-pay

## 2013-05-10 NOTE — Telephone Encounter (Signed)
Message copied by Marilynne Halsted on Fri May 10, 2013 10:28 AM ------      Message from: [name]      Created: Fri May 10, 2013  7:47 AM       Stable 40 to 59% b/l carotid disease,      Relatively unchanged in one year ------

## 2013-05-14 ENCOUNTER — Emergency Department: Payer: Self-pay | Admitting: Emergency Medicine

## 2013-05-14 LAB — COMPREHENSIVE METABOLIC PANEL
Albumin: 3.6 g/dL (ref 3.4–5.0)
Alkaline Phosphatase: 48 U/L — ABNORMAL LOW (ref 50–136)
Anion Gap: 5 — ABNORMAL LOW (ref 7–16)
BUN: 22 mg/dL — ABNORMAL HIGH (ref 7–18)
Bilirubin,Total: 0.3 mg/dL (ref 0.2–1.0)
Calcium, Total: 8.6 mg/dL (ref 8.5–10.1)
Chloride: 106 mmol/L (ref 98–107)
Co2: 29 mmol/L (ref 21–32)
Creatinine: 1.42 mg/dL — ABNORMAL HIGH (ref 0.60–1.30)
EGFR (African American): 57 — ABNORMAL LOW
EGFR (Non-African Amer.): 49 — ABNORMAL LOW
Glucose: 128 mg/dL — ABNORMAL HIGH (ref 65–99)
Osmolality: 284 (ref 275–301)
Potassium: 3.9 mmol/L (ref 3.5–5.1)
SGOT(AST): 24 U/L (ref 15–37)
SGPT (ALT): 20 U/L (ref 12–78)
Sodium: 140 mmol/L (ref 136–145)
Total Protein: 6.5 g/dL (ref 6.4–8.2)

## 2013-05-14 LAB — CBC
HCT: 38.4 % — ABNORMAL LOW (ref 40.0–52.0)
HGB: 13.7 g/dL (ref 13.0–18.0)
MCH: 33.2 pg (ref 26.0–34.0)
MCHC: 35.7 g/dL (ref 32.0–36.0)
MCV: 93 fL (ref 80–100)
Platelet: 177 10*3/uL (ref 150–440)
RBC: 4.13 10*6/uL — ABNORMAL LOW (ref 4.40–5.90)
RDW: 12.5 % (ref 11.5–14.5)
WBC: 3.9 10*3/uL (ref 3.8–10.6)

## 2013-05-14 LAB — CK TOTAL AND CKMB (NOT AT ARMC)
CK, Total: 78 U/L (ref 35–232)
CK-MB: 1.2 ng/mL (ref 0.5–3.6)

## 2013-05-14 LAB — TROPONIN I: Troponin-I: <0.02 ng/mL

## 2013-05-15 LAB — TROPONIN I: Troponin-I: <0.02 ng/mL

## 2013-05-15 NOTE — Telephone Encounter (Signed)
Pt aware of results.   States that he went to the ED last night w/ chest pain, took 4 nitros, dx w/ indigestion. Pt sched to see Dr. Mariah Milling tomorrow for f/u.

## 2013-05-16 ENCOUNTER — Ambulatory Visit (INDEPENDENT_AMBULATORY_CARE_PROVIDER_SITE_OTHER): Payer: Medicare Other | Admitting: Cardiovascular Disease

## 2013-05-16 ENCOUNTER — Encounter: Payer: Self-pay | Admitting: Cardiovascular Disease

## 2013-05-16 VITALS — BP 120/64 | HR 67 | Ht 73.0 in | Wt 204.2 lb

## 2013-05-16 DIAGNOSIS — I25810 Atherosclerosis of coronary artery bypass graft(s) without angina pectoris: Secondary | ICD-10-CM

## 2013-05-16 DIAGNOSIS — R079 Chest pain, unspecified: Secondary | ICD-10-CM

## 2013-05-16 DIAGNOSIS — E785 Hyperlipidemia, unspecified: Secondary | ICD-10-CM

## 2013-05-16 DIAGNOSIS — I7389 Other specified peripheral vascular diseases: Secondary | ICD-10-CM

## 2013-05-16 DIAGNOSIS — I10 Essential (primary) hypertension: Secondary | ICD-10-CM

## 2013-05-16 DIAGNOSIS — I6529 Occlusion and stenosis of unspecified carotid artery: Secondary | ICD-10-CM

## 2013-05-16 NOTE — Assessment & Plan Note (Signed)
Encouraged him to take his Vytorin daily, not every other day. Goal LDL less than 70

## 2013-05-16 NOTE — Assessment & Plan Note (Signed)
Stable 40-50% bilateral carotid disease

## 2013-05-16 NOTE — Assessment & Plan Note (Signed)
Encouraged him to stay on his cholesterol medication. No claudication symptoms at this time

## 2013-05-16 NOTE — Assessment & Plan Note (Signed)
Blood pressure is well controlled on today's visit. No changes made to the medications. 

## 2013-05-16 NOTE — Patient Instructions (Addendum)
You are doing well. No medication changes were made.  Please call us if you have new issues that need to be addressed before your next appt.  Your physician wants you to follow-up in: 6 months.  You will receive a reminder letter in the mail two months in advance. If you don't receive a letter, please call our office to schedule the follow-up appointment.   

## 2013-05-16 NOTE — Progress Notes (Signed)
Patient ID: [name], male    DOB: [birth year], 72 y.o.   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 He has a AAA less than 3 cm, mild bilateral carotid arterial disease estimated at 40-50% bilaterally.  In general he has been doing well. He reports having recent episode of chest pain 05/14/2013. He felt was indigestion. He took several nitroglycerin with no significant improvement in his symptoms. Symptoms were severe, felt similar to his previous MI. He got in his car and on the way, had a big burp. Symptoms seemed to resolve at that time. He continued on to the emergency room. There cardiac enzymes were negative x2, EKG unchanged. He was released with followup today. He reports having had prior chest pain episode relieved with belching in the past.   Reports his weight has been stable, he is trying to eat healthy on a gluten free diet. Seen by his orthopedic this morning for followup of total hip replacements x2 He is tolerating his Vytorin 10/20. Previously he reported he was taking this every other day. Cholesterol earlier in 2014 was poorly controlled, 200. Reports he is taking this daily  He has a diagnosis of obstructive sleep apnea. Could not handle CPAP despite numerous attempts. Symptoms better after weight loss  EKG shows normal sinus rhythm with rate 67 beats per minute, left axis deviation, no significant ST or T wave changes, poor R-wave progression through the anterior precordial leads       Outpatient Encounter Prescriptions as of 05/16/2013  Medication Sig Dispense Refill  . aspirin 81 MG tablet Take 81 mg by mouth daily.        Marland Kitchen ezetimibe-simvastatin (VYTORIN) 10-40 MG per tablet Take 1 tablet by mouth at bedtime. Monday Wednesday and Friday      . finasteride (PROSCAR) 5 MG  tablet Take 5 mg by mouth daily.        . nitroGLYCERIN (NITROSTAT) 0.4 MG SL tablet Place 1 tablet (0.4 mg total) under the tongue every 5 (five) minutes as needed.  25 tablet  6  . omeprazole (PRILOSEC) 40 MG capsule Take 1 capsule (40 mg total) by mouth daily.       No facility-administered encounter medications on file as of 05/16/2013.    Review of Systems  Constitutional: Negative.   HENT: Negative.   Eyes: Negative.   Respiratory: Positive for chest tightness.   Cardiovascular: Negative.   Gastrointestinal: Negative.   Musculoskeletal: Positive for back pain, arthralgias and gait problem.  Skin: Negative.   Neurological: Negative.   Psychiatric/Behavioral: Negative.   All other systems reviewed and are negative.    BP 120/64  Pulse 67  Ht 6\' 1"  (1.854 m)  Wt 204 lb 4 oz (92.647 kg)  BMI 26.95 kg/m2  Physical Exam  Nursing note and vitals reviewed. Constitutional: He is oriented to person, place, and time. He appears well-developed and well-nourished.  HENT:  Head: Normocephalic.  Nose: Nose normal.  Mouth/Throat: Oropharynx is clear and moist.  Eyes: Conjunctivae are normal. Pupils are equal, round, and reactive to light.  Neck: Normal range of motion. Neck supple. No JVD present.  Cardiovascular: Normal rate, regular rhythm, S1 normal, S2 normal, normal heart sounds and intact distal pulses.  Exam reveals no gallop and no friction rub.   No murmur heard. Pulmonary/Chest: Effort normal  and breath sounds normal. No respiratory distress. He has no wheezes. He has no rales. He exhibits no tenderness.  Abdominal: Soft. Bowel sounds are normal. He exhibits no distension. There is no tenderness.  Musculoskeletal: Normal range of motion. He exhibits no edema and no tenderness.  Lymphadenopathy:    He has no cervical adenopathy.  Neurological: He is alert and oriented to person, place, and time. Coordination normal.  Skin: Skin is warm and dry. No rash noted. No erythema.   Psychiatric: He has a normal mood and affect. His behavior is normal. Judgment and thought content normal.      Assessment and Plan

## 2013-05-16 NOTE — Assessment & Plan Note (Signed)
Recent episode of chest pain consistent with GI etiology. He is convinced it was GI. No further episodes since that time. No further testing at this time unless he has further symptoms.

## 2013-06-20 ENCOUNTER — Other Ambulatory Visit: Payer: Self-pay

## 2013-07-03 ENCOUNTER — Telehealth: Payer: Self-pay

## 2013-07-03 ENCOUNTER — Emergency Department: Payer: Self-pay

## 2013-07-03 LAB — BASIC METABOLIC PANEL
Anion Gap: 4 — ABNORMAL LOW (ref 7–16)
BUN: 20 mg/dL — ABNORMAL HIGH (ref 7–18)
Calcium, Total: 9 mg/dL (ref 8.5–10.1)
Chloride: 108 mmol/L — ABNORMAL HIGH (ref 98–107)
Co2: 28 mmol/L (ref 21–32)
Creatinine: 1.16 mg/dL (ref 0.60–1.30)
EGFR (African American): >60
EGFR (Non-African Amer.): >60
Glucose: 116 mg/dL — ABNORMAL HIGH (ref 65–99)
Osmolality: 283 (ref 275–301)
Potassium: 3.9 mmol/L (ref 3.5–5.1)
Sodium: 140 mmol/L (ref 136–145)

## 2013-07-03 LAB — CBC
HCT: 41.8 % (ref 40.0–52.0)
HGB: 14.9 g/dL (ref 13.0–18.0)
MCH: 32.8 pg (ref 26.0–34.0)
MCHC: 35.6 g/dL (ref 32.0–36.0)
MCV: 92 fL (ref 80–100)
Platelet: 197 10*3/uL (ref 150–440)
RBC: 4.53 10*6/uL (ref 4.40–5.90)
RDW: 12.5 % (ref 11.5–14.5)
WBC: 5.6 10*3/uL (ref 3.8–10.6)

## 2013-07-03 LAB — TROPONIN I
Troponin-I: <0.02 ng/mL
Troponin-I: <0.02 ng/mL

## 2013-07-03 NOTE — Telephone Encounter (Signed)
Spoke w/ pt.  He reports severe, worsening chest pain radiating to shoulder blade x 2 days. Reports "sharp pain when I push on my chest", but otherwise describes pain as dull, but worsening.  Reports pain when taking deep breaths, left arm is numb and cold. Denies nausea, sweating or sob.  Reports that he has had "several heart attacks" and this pain is different.  Pt has nitro but has not taken it.  Instructed pt to take nitro while sitting.  Pt states that he knows he should go to the hospital, but wanted to make sure before he went. Instructed pt to call 911, as they can begin treatment faster.  Pt states that he would prefer that his wife drive him, as she can have him at [facility] in 15 mins. Spoke w/ pt's wife and asked her to call 911.  She states that pt was recently seen and all testing ruled out cardiac issues.  Asked her again to hang up and call 911, as I would rather be safe than sorry. Wife states that she will do so.

## 2013-07-04 ENCOUNTER — Other Ambulatory Visit: Payer: Self-pay | Admitting: Cardiovascular Disease

## 2013-07-04 ENCOUNTER — Other Ambulatory Visit: Payer: Self-pay | Admitting: *Deleted

## 2013-07-04 MED ORDER — NITROGLYCERIN 0.4 MG SL SUBL: 0.4000 mg | SUBLINGUAL_TABLET | SUBLINGUAL | Status: DC | PRN

## 2013-07-04 NOTE — Telephone Encounter (Signed)
Requested Prescriptions   Signed Prescriptions Disp Refills  . nitroGLYCERIN (NITROSTAT) 0.4 MG SL tablet 25 tablet 6    Sig: Place 1 tablet (0.4 mg total) under the tongue every 5 (five) minutes as needed.    Authorizing Provider: Antonieta Iba    Ordering User: Kendrick Fries

## 2013-07-05 ENCOUNTER — Ambulatory Visit (INDEPENDENT_AMBULATORY_CARE_PROVIDER_SITE_OTHER): Payer: Medicare Other | Admitting: Internal Medicine

## 2013-07-05 ENCOUNTER — Encounter: Payer: Self-pay | Admitting: Internal Medicine

## 2013-07-05 VITALS — BP 130/72 | HR 72 | Temp 98.2°F | Resp 16 | Ht 73.0 in | Wt 206.0 lb

## 2013-07-05 DIAGNOSIS — E785 Hyperlipidemia, unspecified: Secondary | ICD-10-CM

## 2013-07-05 DIAGNOSIS — R079 Chest pain, unspecified: Secondary | ICD-10-CM

## 2013-07-05 DIAGNOSIS — I10 Essential (primary) hypertension: Secondary | ICD-10-CM

## 2013-07-05 LAB — LIPID PANEL
Cholesterol: 155 mg/dL (ref 0–200)
HDL: 39.2 mg/dL (ref >39.00)
LDL Cholesterol: 84 mg/dL (ref 0–99)
Total CHOL/HDL Ratio: 4
Triglycerides: 158 mg/dL — ABNORMAL HIGH (ref 0.0–149.0)
VLDL: 31.6 mg/dL (ref 0.0–40.0)

## 2013-07-05 LAB — COMPREHENSIVE METABOLIC PANEL
ALT: 17 U/L (ref 0–53)
AST: 19 U/L (ref 0–37)
Albumin: 4.2 g/dL (ref 3.5–5.2)
Alkaline Phosphatase: 34 U/L — ABNORMAL LOW (ref 39–117)
BUN: 14 mg/dL (ref 6–23)
CO2: 31 mEq/L (ref 19–32)
Calcium: 9.5 mg/dL (ref 8.4–10.5)
Chloride: 104 mEq/L (ref 96–112)
Creatinine, Ser: 1 mg/dL (ref 0.4–1.5)
GFR: 78.01 mL/min (ref >60.00)
Glucose, Bld: 95 mg/dL (ref 70–99)
Potassium: 4.4 mEq/L (ref 3.5–5.1)
Sodium: 139 mEq/L (ref 135–145)
Total Bilirubin: 0.5 mg/dL (ref 0.3–1.2)
Total Protein: 6.8 g/dL (ref 6.0–8.3)

## 2013-07-05 NOTE — Patient Instructions (Signed)
The patient is instructed to continue all medications as prescribed. Schedule followup with check out clerk upon leaving the clinic  

## 2013-07-05 NOTE — Progress Notes (Signed)
Subjective:    Patient ID: [name], male    DOB: 1941-08-10, 72 y.o.   MRN: [id number]  Hypertension Associated symptoms include chest pain and shortness of breath.  Hyperlipidemia Associated symptoms include chest pain and shortness of breath.   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   HENT: Positive for rhinorrhea and sinus pressure.   Respiratory: Positive for chest tightness and shortness of breath.   Cardiovascular: Positive for chest pain.  Gastrointestinal: Negative for abdominal pain and abdominal distention.  Endocrine: Negative.   Genitourinary: Positive for frequency.  Psychiatric/Behavioral: Positive for dysphoric mood and decreased concentration.       Past Medical History  Diagnosis Date  . HYPERLIPIDEMIA 04/26/2007  . HYPERTENSION 04/30/2007  . 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 GERD 04/30/2007  . INGUINAL HERNIA, RIGHT 05/26/2010  . BENIGN PROSTATIC HYPERTROPHY, WITH URINARY OBSTRUCTION 09/06/2007  . 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 LOC OSTEOARTHROS NOT SPEC PRIM/SEC LOWER LEG 11/05/2009  . OSTEOARTHRITIS 04/26/2007  . 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 Edema 04/20/2010  . PSA, INCREASED 07/09/2008  . DVT, HX OF 04/26/2007  . Lumbar disc disease with radiculopathy   . Spinal stenosis of lumbar region     History   Social History  . Marital Status: Married    Spouse Name: [name]    Number of Children: [name]  . Years of Education: [name]   Occupational History  . Not on file.   Social History Main Topics  . Smoking status: Former Smoker    Quit date: 08/15/1988   . Smokeless tobacco: Not on file  . Alcohol Use: No  . Drug Use: No  . Sexual Activity: Yes   Other Topics Concern  . Not on file   Social History Narrative  . No narrative on file    Past Surgical History  Procedure Laterality Date  . Appendectomy    . Coronary artery bypass graft      3vessel  in 1990  . Cataract extraction, bilateral    . Joint replacement      both hips in 2002  . Spine surgery  1984    two ruptured discs/fusion  . Knee arthroscopy      x 2  . Cystoscopy  12/23/10    Family History  Problem Relation Age of Onset  . Stroke Mother   . Heart attack Mother   . Diabetes Mother   . Stroke Father   . Heart disease Father   . Cancer Sister   . Diabetes Brother     Allergies  Allergen Reactions  . Morphine   . Rofecoxib     Current Outpatient Prescriptions on File Prior to Visit  Medication Sig Dispense Refill  . aspirin 81 MG tablet Take 81 mg by mouth daily.        Marland Kitchen ezetimibe-simvastatin (VYTORIN) 10-40 MG per tablet Take 1 tablet by mouth at bedtime. Monday Wednesday and Friday      . finasteride (PROSCAR) 5 MG tablet Take 5 mg by mouth daily.        Marland Kitchen  NITROSTAT 0.4 MG SL tablet PLACE 1 TABLET UNDER THE TONGUE EVERY 5 MINUTES AS NEEDED.  25 tablet  1  . omeprazole (PRILOSEC) 40 MG capsule Take 1 capsule (40 mg total) by mouth daily.       No current facility-administered medications on file prior to visit.    BP 130/72  Pulse 72  Temp(Src) 98.2 F (36.8 C)  Resp 16  Ht 6\' 1"  (1.854 m)  Wt 206 lb (93.441 kg)  BMI 27.18 kg/m2     Objective:   Physical Exam  Nursing note and vitals reviewed. Constitutional: He is oriented to person, place, and time. He appears well-nourished.  HENT:  Head: Normocephalic and atraumatic.  Eyes: Conjunctivae are normal. Pupils are equal, round, and reactive to light.  Cardiovascular: Normal rate and regular rhythm.  Exam reveals no friction rub.   Murmur heard. Pulmonary/Chest: Effort normal and  breath sounds normal.  Abdominal: Soft. Bowel sounds are normal.  Neurological: He is alert and oriented to person, place, and time.  Skin: Skin is warm and dry.          Assessment & Plan:  Allusio did not want him to use the treadmill so we will order a stress cardiolyte  Increased GERD  Stable HTN  Seeing [facility] for [facility]

## 2013-07-29 ENCOUNTER — Encounter: Payer: Self-pay | Admitting: Cardiology

## 2013-07-30 ENCOUNTER — Encounter: Payer: Self-pay | Admitting: Cardiology

## 2013-07-30 ENCOUNTER — Ambulatory Visit (HOSPITAL_COMMUNITY): Payer: Medicare Other | Attending: Cardiology | Admitting: Radiology

## 2013-07-30 DIAGNOSIS — R0989 Other specified symptoms and signs involving the circulatory and respiratory systems: Secondary | ICD-10-CM

## 2013-07-30 DIAGNOSIS — E785 Hyperlipidemia, unspecified: Secondary | ICD-10-CM

## 2013-07-30 DIAGNOSIS — I10 Essential (primary) hypertension: Secondary | ICD-10-CM

## 2013-07-30 DIAGNOSIS — R079 Chest pain, unspecified: Secondary | ICD-10-CM

## 2013-07-30 MED ORDER — TECHNETIUM TC 99M SESTAMIBI GENERIC - CARDIOLITE
30.0000 | Freq: Once | INTRAVENOUS | Status: AC | PRN
  Administered 2013-07-30: 30 via INTRAVENOUS

## 2013-08-01 ENCOUNTER — Encounter: Payer: Self-pay | Admitting: Cardiology

## 2013-08-01 ENCOUNTER — Ambulatory Visit (HOSPITAL_COMMUNITY): Payer: Medicare Other | Attending: Cardiology | Admitting: Radiology

## 2013-08-01 VITALS — BP 133/68 | HR 63 | Ht 73.0 in | Wt 206.0 lb

## 2013-08-01 DIAGNOSIS — R0602 Shortness of breath: Secondary | ICD-10-CM

## 2013-08-01 DIAGNOSIS — R079 Chest pain, unspecified: Secondary | ICD-10-CM

## 2013-08-01 DIAGNOSIS — I2510 Atherosclerotic heart disease of native coronary artery without angina pectoris: Secondary | ICD-10-CM

## 2013-08-01 MED ORDER — REGADENOSON 0.4 MG/5ML IV SOLN
0.4000 mg | Freq: Once | INTRAVENOUS | Status: AC
  Administered 2013-08-01: 0.4 mg via INTRAVENOUS

## 2013-08-01 MED ORDER — TECHNETIUM TC 99M SESTAMIBI GENERIC - CARDIOLITE
30.0000 | Freq: Once | INTRAVENOUS | Status: AC | PRN
  Administered 2013-08-01: 30 via INTRAVENOUS

## 2013-08-01 NOTE — Progress Notes (Signed)
[facility] SITE 3 NUCLEAR MED [address] [place], [place] [address] [phone]    Cardiology Nuclear Med Study  [name] is a 72 y.o. male     MRN : [id number]     DOB: 10/20/1940  Procedure Date: 08/01/2013  Nuclear Med Background Indication for Stress Test:  Evaluation for Ischemia, Graft Patency and Stent Patency History:  CABG, Stent, Echo,and '07 Myocardial Perfusion Imaging-Normal, EF=54%' Cardiac Risk Factors: Carotid Disease, Family History - CAD, History of Smoking, Hypertension, Lipids and PVD (AAA 2.8 CM)  Symptoms:Chest Pain with/without exertion (last occurrence 3-4 weeks ago), Dizziness, DOE, Palpitations and SOB   Nuclear Pre-Procedure Caffeine/Decaff Intake:  None > 12 hrs NPO After: 8:00pm   Lungs:  clear O2 Sat: 93 to 97% after deep breaths on room air. IV 0.9% NS with Angio Cath:  20g  IV Site: R Wrist x 1, tolerated well IV Started by:  Irean Hong, RN  Chest Size (in):  44 Cup Size: n/a  Height: 6\' 1"  (1.854 m)  Weight:  206 lb (93.441 kg)  BMI:  Body mass index is 27.18 kg/(m^2). Tech Comments:  Patient complained of diarrhea x 4 last night, but no fever. No diarrhea today, and patient wants to complete the test. Irean Hong, RN.    Nuclear Med Study 1 or 2 day study: 2 day  Stress Test Type:  Lexiscan  Reading MD: Olga Millers, MD  Order Authorizing Provider:  Darryll Capers, MD  Resting Radionuclide: Technetium 72m Sestamibi  Resting Radionuclide Dose: 33.0 mCi on 07-30-13  Stress Radionuclide:  Technetium 76m Sestamibi  Stress Radionuclide Dose:33.0 mCi on 08-01-13          Stress Protocol Rest HR: 63 Stress HR: 111  Rest BP: 133/68 Stress BP: 147/72  Exercise Time (min): n/a METS: n/a   Predicted Max HR: 148 bpm % Max HR: 75 bpm Rate Pressure Product: 95284   Dose of Adenosine (mg):  n/a Dose of Lexiscan: 0.4 mg  Dose of Atropine (mg): n/a Dose of Dobutamine: n/a mcg/kg/min (at max HR)  Stress Test Technologist:  Irean Hong, RN  Nuclear Technologist:  Harlow Asa, CNMT     Rest Procedure:  Myocardial perfusion imaging was performed at rest 45 minutes following the intravenous administration of Technetium 31m Sestamibi. Rest ECG: NSR, cannot R/O prior septal MI.  Stress Procedure:  The patient received IV Lexiscan 0.4 mg over 15-seconds.  Technetium 20m Sestamibi injected at 30-seconds. The patient complained of increase heart rate, stomach pain, fatigue, and chest tightness with Lexiscan.  Quantitative spect images were obtained after a 45 minute delay. Stress ECG: No significant ST segment change suggestive of ischemia.  QPS Raw Data Images:  Acquisition technically good; normal left ventricular size. Stress Images:  There is decreased uptake in the distal anterior wall and inferior basal wall. Rest Images:  There is decreased uptake in the distal anterior wall and inferior basal wall. Subtraction (SDS):  No evidence of ischemia. Transient Ischemic Dilatation (Normal <1.22):  1.05 Lung/Heart Ratio (Normal <0.45):  0.35  Quantitative Gated Spect Images QGS EDV:  101 ml QGS ESV:  53 ml  Impression Exercise Capacity:  Lexiscan with no exercise. BP Response:  Normal blood pressure response. Clinical Symptoms:  There is chest tightness. ECG Impression:  No significant ST segment change suggestive of ischemia. Comparison with Prior Nuclear Study: No images to compare  Overall Impression:  Normal stress nuclear study. and Low risk stress nuclear study with small, moderate  intensity, fixed distal anterior and inferior basal defects consistent with soft tissue attenuation; no ischemia.  LV Ejection Fraction: 48%.  LV Wall Motion:  Mild global hypokinesis.   Olga Millers

## 2013-08-12 ENCOUNTER — Encounter: Payer: Self-pay | Admitting: Internal Medicine

## 2013-08-19 ENCOUNTER — Telehealth: Payer: Self-pay | Admitting: Internal Medicine

## 2013-08-19 NOTE — Telephone Encounter (Signed)
Patient would like a phone call back regarding results from stress test

## 2013-08-19 NOTE — Telephone Encounter (Signed)
Patient would like a phone call back regarding results from stress test  °

## 2013-08-19 NOTE — Telephone Encounter (Signed)
Test was felt to be normal perfusion!

## 2013-08-27 ENCOUNTER — Ambulatory Visit (INDEPENDENT_AMBULATORY_CARE_PROVIDER_SITE_OTHER): Payer: Medicare HMO | Admitting: Internal Medicine

## 2013-08-27 ENCOUNTER — Telehealth: Payer: Self-pay | Admitting: Internal Medicine

## 2013-08-27 ENCOUNTER — Ambulatory Visit: Payer: Self-pay | Admitting: Family Medicine

## 2013-08-27 ENCOUNTER — Encounter: Payer: Self-pay | Admitting: Internal Medicine

## 2013-08-27 VITALS — BP 140/80 | HR 89 | Temp 97.9°F | Resp 20 | Ht 73.0 in | Wt 214.0 lb

## 2013-08-27 DIAGNOSIS — L03818 Cellulitis of other sites: Secondary | ICD-10-CM

## 2013-08-27 DIAGNOSIS — L02818 Cutaneous abscess of other sites: Secondary | ICD-10-CM

## 2013-08-27 DIAGNOSIS — L03811 Cellulitis of head [any part, except face]: Secondary | ICD-10-CM

## 2013-08-27 MED ORDER — AMOXICILLIN-POT CLAVULANATE 875-125 MG PO TABS: 1.0000 | ORAL_TABLET | Freq: Two times a day (BID) | ORAL | Status: DC

## 2013-08-27 NOTE — Progress Notes (Signed)
Pre-visit discussion using our clinic review tool. No additional management support is needed unless otherwise documented below in the visit note.  

## 2013-08-27 NOTE — Telephone Encounter (Signed)
Patient Information:  Caller Name: Samie  Phone: [phone]  Patient: [name], [name]  Gender: Male  DOB: 1941/03/18  Age: 73 Years  PCP: Benay Pillow (Adults only)  Office Follow Up:  Does the office need to follow up with this patient?: No  Instructions For The Office: [name]   Symptoms  Reason For Call & Symptoms: Sinus headache, pressure at nostrils, forehead, green mucus from nose since Fri 1/9.  Some Left ear pain at times.  Thinks sinus infection.  Also concern about rash in hairline on Right side of head - worried about shingles.  Reviewed Health History In EMR: Yes  Reviewed Medications In EMR: Yes  Reviewed Allergies In EMR: Yes  Reviewed Surgeries / Procedures: Yes  Date of Onset of Symptoms: 08/23/2013  Treatments Tried: Tylenol  Treatments Tried Worked: No  Guideline(s) Used:  Sinus Pain and Congestion  Disposition Per Guideline:   See Today in Office  Reason For Disposition Reached:   [place]  Advice Given:  [name]  Patient Will Follow Care Advice:  YES  Appointment Scheduled:  08/27/2013 11:30:00 Appointment Scheduled Provider:  Stevie Kern (Family Practice)

## 2013-08-27 NOTE — Patient Instructions (Signed)
Take your antibiotic as prescribed until ALL of it is gone, but stop if you develop a rash, swelling, or any side effects of the medication.  Contact our office as soon as possible if  there are side effects of the medication.   Use saline irrigation, warm  moist compresses and over-the-counter decongestants only as directed.  Call if there is no improvement in 5 to 7 days, or sooner if you develop increasing pain, fever, or any new symptoms.

## 2013-08-27 NOTE — Progress Notes (Signed)
Subjective:    Patient ID: [name], male    DOB: [birth year], 73 y.o.   MRN: [id number]  HPI  73 year old patient who presents with a chief complaint of sinus congestion drainage. The sinus discharge is described as green. He is also concerned about a dermatitis involving his right frontal scalp area at the hairline. He has had the shingles vaccine in the past but is concerned about localized shingles. He describes significant burning discomfort. The rash and discomfort have been present for approximately 7 days  Past Medical History  Diagnosis Date  . HYPERLIPIDEMIA 04/26/2007  . HYPERTENSION 04/30/2007  . 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 GERD 04/30/2007  . INGUINAL HERNIA, RIGHT 05/26/2010  . BENIGN PROSTATIC HYPERTROPHY, WITH URINARY OBSTRUCTION 09/06/2007  . 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 LOC OSTEOARTHROS NOT SPEC PRIM/SEC LOWER LEG 11/05/2009  . OSTEOARTHRITIS 04/26/2007  . 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 Edema 04/20/2010  . PSA, INCREASED 07/09/2008  . DVT, HX OF 04/26/2007  . Lumbar disc disease with radiculopathy   . Spinal stenosis of lumbar region     History   Social History  . Marital Status: Married    Spouse Name: [name]    Number of Children: [name]  . Years of Education: [name]   Occupational History  . Not on file.   Social History Main Topics  . Smoking status: Former Smoker    Quit date: 08/15/1988  . Smokeless tobacco: Not on file  . Alcohol Use: No  . Drug Use: No  . Sexual Activity: Yes   Other Topics Concern  . Not on file   Social History Narrative  . No narrative on file    Past Surgical History  Procedure Laterality Date  . Appendectomy    . Coronary artery bypass graft      3vessel  in 1990  . Cataract extraction, bilateral    . Joint replacement      both hips in 2002  . Spine surgery  1984    two ruptured  discs/fusion  . Knee arthroscopy      x 2  . Cystoscopy  12/23/10    Family History  Problem Relation Age of Onset  . Stroke Mother   . Heart attack Mother   . Diabetes Mother   . Stroke Father   . Heart disease Father   . Cancer Sister   . Diabetes Brother     Allergies  Allergen Reactions  . Morphine   . Rofecoxib     Current Outpatient Prescriptions on File Prior to Visit  Medication Sig Dispense Refill  . aspirin 81 MG tablet Take 81 mg by mouth daily.        Marland Kitchen ezetimibe-simvastatin (VYTORIN) 10-40 MG per tablet Take 1 tablet by mouth at bedtime. Monday Wednesday and Friday      . finasteride (PROSCAR) 5 MG tablet Take 5 mg by mouth daily.        Marland Kitchen NITROSTAT 0.4 MG SL tablet PLACE 1 TABLET UNDER THE TONGUE EVERY 5 MINUTES AS NEEDED.  25 tablet  1  . omeprazole (PRILOSEC) 40 MG capsule Take 1 capsule (40 mg total) by mouth daily.       No current facility-administered medications on file prior to visit.    BP 140/80  Pulse 89  Temp(Src) 97.9 F (36.6 C) (Oral)  Resp 20  Ht 6\' 1"  (  1.854 m)  Wt 214 lb (97.07 kg)  BMI 28.24 kg/m2  SpO2 97%       Review of Systems  Constitutional: Negative for fever, chills, appetite change and fatigue.  HENT: Positive for postnasal drip, rhinorrhea and sinus pressure. Negative for congestion, dental problem, ear pain, hearing loss, sore throat, tinnitus, trouble swallowing and voice change.   Eyes: Negative for pain, discharge and visual disturbance.  Respiratory: Negative for cough, chest tightness, wheezing and stridor.   Cardiovascular: Negative for chest pain, palpitations and leg swelling.  Gastrointestinal: Negative for nausea, vomiting, abdominal pain, diarrhea, constipation, blood in stool and abdominal distention.  Genitourinary: Negative for urgency, hematuria, flank pain, discharge, difficulty urinating and genital sores.  Musculoskeletal: Negative for arthralgias, back pain, gait problem, joint swelling, myalgias  and neck stiffness.  Skin: Positive for rash.  Neurological: Negative for dizziness, syncope, speech difficulty, weakness, numbness and headaches.  Hematological: Negative for adenopathy. Does not bruise/bleed easily.  Psychiatric/Behavioral: Negative for behavioral problems and dysphoric mood. The patient is not nervous/anxious.        Objective:   Physical Exam  Constitutional: He is oriented to person, place, and time. He appears well-developed.  HENT:  Head: Normocephalic.  Right Ear: External ear normal.  Left Ear: External ear normal.  Eyes: Conjunctivae and EOM are normal.  Neck: Normal range of motion.  Cardiovascular: Normal rate and normal heart sounds.   Pulmonary/Chest: Breath sounds normal.  Abdominal: Bowel sounds are normal.  Musculoskeletal: Normal range of motion. He exhibits no edema and no tenderness.  Neurological: He is alert and oriented to person, place, and time.  Skin:  Approximate 3 cm patchy area of tender erythema involving the right frontal scalp area at the hairline. No vesiculations  Psychiatric: He has a normal mood and affect. His behavior is normal.          Assessment & Plan:   Probable cellulitis right scalp. We'll treat with Augmentin which should cover a possible early sinus infection. Cannot exclude mild shingles but unlikely to be helped with antivirals 7 days into the illness. Patient has had a shingles vaccine. Will observe. Patient does not feel he needs additional analgesics

## 2013-08-27 NOTE — Telephone Encounter (Signed)
Appointment with dr Sherren Mocha is cancelled and appointment made with dr k at [facility]

## 2013-08-29 ENCOUNTER — Ambulatory Visit: Payer: Medicare Other | Admitting: Cardiovascular Disease

## 2013-08-30 ENCOUNTER — Telehealth: Payer: Self-pay | Admitting: Internal Medicine

## 2013-08-30 ENCOUNTER — Encounter: Payer: Self-pay | Admitting: Internal Medicine

## 2013-08-30 NOTE — Telephone Encounter (Signed)
What do you want me to do with these?

## 2013-08-30 NOTE — Telephone Encounter (Signed)
Talked with gabby and gave all the in fo and she will take care--she said I did not have to send a referral

## 2013-08-30 NOTE — Telephone Encounter (Addendum)
Pt needs referral dr Nehemiah Massed dermatology at [facility] in [place] for skin check. Pt has appt on 09-05-13 pt  fax to [phone] and another referral to dr tim gollan cardiologist for year check up. Pt has appt on 09-12-13  fax [phone]. Pt has [employer]

## 2013-09-12 ENCOUNTER — Encounter: Payer: Self-pay | Admitting: Cardiovascular Disease

## 2013-09-12 ENCOUNTER — Ambulatory Visit (INDEPENDENT_AMBULATORY_CARE_PROVIDER_SITE_OTHER): Payer: Medicare HMO | Admitting: Cardiovascular Disease

## 2013-09-12 VITALS — BP 140/78 | HR 57 | Ht 73.0 in | Wt 212.5 lb

## 2013-09-12 DIAGNOSIS — I10 Essential (primary) hypertension: Secondary | ICD-10-CM | POA: Diagnosis not present

## 2013-09-12 DIAGNOSIS — R079 Chest pain, unspecified: Secondary | ICD-10-CM | POA: Diagnosis not present

## 2013-09-12 DIAGNOSIS — I25810 Atherosclerosis of coronary artery bypass graft(s) without angina pectoris: Secondary | ICD-10-CM | POA: Diagnosis not present

## 2013-09-12 DIAGNOSIS — E785 Hyperlipidemia, unspecified: Secondary | ICD-10-CM | POA: Diagnosis not present

## 2013-09-12 DIAGNOSIS — R0789 Other chest pain: Secondary | ICD-10-CM | POA: Insufficient documentation

## 2013-09-12 NOTE — Patient Instructions (Signed)
You are doing well. No medication changes were made.  Please call us if you have new issues that need to be addressed before your next appt.  Your physician wants you to follow-up in: 6 months.  You will receive a reminder letter in the mail two months in advance. If you don't receive a letter, please call our office to schedule the follow-up appointment.   

## 2013-09-12 NOTE — Progress Notes (Signed)
Patient ID: [name], male    DOB: 07-22-1941, 73 y.o.   MRN: [id number]  HPI Comments: 73 year old gentleman with underlying coronary artery disease, bypass in 1990, history of DVT in the left lower extremity, stenting to the circumflex in 2002, hyperlipidemia who presents for routine followup. He has a AAA less than 3 cm, mild bilateral carotid arterial disease estimated at 40-50% bilaterally. Numerous previous episode of chest pain relieved with belching consistent with GI etiology.  In followup today, he has had several episodes of chest pain. He went to the emergency room in [facility] 07/03/2013 with chest pain. Had a CT scan of his chest which was essentially benign. Nitroglycerin did not help his pain. He ruled out and was sent home.  Repeat episode of chest pain 08/05/2013 with stress test at that time showing no significant ischemia, ejection fraction 48%. It was felt his chest pain was atypical in nature. She feels it was from GI etiology given his negative stress test.  He does report having some cold induced chest tightness/shortness of breath, asthma. He is recently joined a gym. No significant episodes of chest pain since December   episode of chest pain 05/14/2013. He felt was indigestion. He took several nitroglycerin with no significant improvement in his symptoms. Symptoms were severe, felt similar to his previous MI. He got in his car and on the way, had a big burp. Symptoms seemed to resolve at that time. He continued on to the emergency room. There cardiac enzymes were negative x2, EKG unchanged. He reports having had prior chest pain episode relieved with belching in the past.   total hip replacements x2 He is tolerating his Vytorin 10/40 every other day. Previously was taking this 10/20 mg daily  He has a diagnosis of obstructive sleep apnea. Could not handle CPAP despite numerous attempts. Symptoms better after weight loss  EKG shows normal sinus rhythm with rate 57  beats per minute, left axis deviation, no significant ST or T wave changes, poor R-wave progression through the anterior precordial leads       Outpatient Encounter Prescriptions as of 09/12/2013  Medication Sig  . aspirin 81 MG tablet Take 81 mg by mouth daily.    Marland Kitchen ezetimibe-simvastatin (VYTORIN) 10-40 MG per tablet Take 1 tablet by mouth at bedtime. Monday Wednesday and Friday  . finasteride (PROSCAR) 5 MG tablet Take 5 mg by mouth daily.    Marland Kitchen NITROSTAT 0.4 MG SL tablet PLACE 1 TABLET UNDER THE TONGUE EVERY 5 MINUTES AS NEEDED.  Marland Kitchen omeprazole (PRILOSEC) 40 MG capsule Take 1 capsule (40 mg total) by mouth daily.  . [DISCONTINUED] amoxicillin-clavulanate (AUGMENTIN) 875-125 MG per tablet Take 1 tablet by mouth 2 (two) times daily.    Review of Systems  Constitutional: Negative.   HENT: Negative.   Eyes: Negative.   Cardiovascular: Negative.   Gastrointestinal: Negative.   Musculoskeletal: Positive for arthralgias, back pain and gait problem.  Skin: Negative.   Neurological: Negative.   Psychiatric/Behavioral: Negative.   All other systems reviewed and are negative.    BP 140/78  Pulse 57  Ht 6\' 1"  (1.854 m)  Wt 212 lb 8 oz (96.389 kg)  BMI 28.04 kg/m2 a Physical Exam  Nursing note and vitals reviewed. Constitutional: He is oriented to person, place, and time. He appears well-developed and well-nourished.  HENT:  Head: Normocephalic.  Nose: Nose normal.  Mouth/Throat: Oropharynx is clear and moist.  Eyes: Conjunctivae are normal. Pupils are equal, round, and reactive to  light.  Neck: Normal range of motion. Neck supple. No JVD present.  Cardiovascular: Normal rate, regular rhythm, S1 normal, S2 normal, normal heart sounds and intact distal pulses.  Exam reveals no gallop and no friction rub.   No murmur heard. Pulmonary/Chest: Effort normal and breath sounds normal. No respiratory distress. He has no wheezes. He has no rales. He exhibits no tenderness.  Abdominal: Soft.  Bowel sounds are normal. He exhibits no distension. There is no tenderness.  Musculoskeletal: Normal range of motion. He exhibits no edema and no tenderness.  Lymphadenopathy:    He has no cervical adenopathy.  Neurological: He is alert and oriented to person, place, and time. Coordination normal.  Skin: Skin is warm and dry. No rash noted. No erythema.  Psychiatric: He has a normal mood and affect. His behavior is normal. Judgment and thought content normal.      Assessment and Plan

## 2013-09-12 NOTE — Assessment & Plan Note (Addendum)
Records reviewed from the visits to the hospital emergency room. He is having frequent almost monthly episodes of chest pain it take him to the emergency room. Often relieved with belching. Recent negative stress test. Recommended he double his omeprazole. For symptoms, try TUMS, Pepcid, carbonation

## 2013-09-12 NOTE — Assessment & Plan Note (Signed)
Blood pressure is well controlled on today's visit. No changes made to the medications. 

## 2013-09-12 NOTE — Assessment & Plan Note (Signed)
Suggested he go back to Vytorin 10/20 mg daily. Goal LDL less than 70

## 2013-09-12 NOTE — Assessment & Plan Note (Signed)
Currently with no symptoms of angina. No further workup at this time. Continue current medication regimen. 

## 2013-09-18 ENCOUNTER — Other Ambulatory Visit: Payer: Self-pay | Admitting: *Deleted

## 2013-09-18 ENCOUNTER — Telehealth: Payer: Self-pay | Admitting: Internal Medicine

## 2013-09-18 MED ORDER — OSELTAMIVIR PHOSPHATE 75 MG PO CAPS: 75.0000 mg | ORAL_CAPSULE | Freq: Two times a day (BID) | ORAL | Status: DC

## 2013-09-18 NOTE — Telephone Encounter (Signed)
Per dr [name]- may have tamiflu 75 bid for 5 days-pt has been coughing for a day.

## 2013-09-18 NOTE — Telephone Encounter (Signed)
Patient Information:  Caller Name: Odean  Phone: [phone]  Patient: [name], [name]  Gender: Male  DOB: 04-May-1941  Age: 73 Years  PCP: Benay Pillow (Adults only)  Office Follow Up:  Does the office need to follow up with this patient?: Yes  Instructions For The Office: Disposition is "Discuss with PCP and Callback by Nurse within 1 hour."   Pt has cough and wife was diagnosed with positive influenza test today.  Pt is considered high risk due to age.    Symptoms  Reason For Call & Symptoms: 09/17/13 cough 09/18/13 cough continues, afebrile.  Pt's wife was diagnosed with positive influenza today.  Her PCP recommended for pt to call his PCP for Tamiflu.  Pt uses [facility].  Reviewed Health History In EMR: Yes  Reviewed Medications In EMR: Yes  Reviewed Allergies In EMR: Yes  Reviewed Surgeries / Procedures: Yes  Date of Onset of Symptoms: 09/17/2013  Guideline(s) Used:  Influenza - Seasonal  Disposition Per Guideline:   Discuss with PCP and Callback by Nurse within 1 Hour  Reason For Disposition Reached:   HIGH RISK (e.g., age > 31 years, pregnant, HIV+, chronic medical condition) and flu symptoms  Advice Given:  Call Back If:  You become short of breath or worse.  Patient Will Follow Care Advice:  YES

## 2013-12-02 ENCOUNTER — Ambulatory Visit (INDEPENDENT_AMBULATORY_CARE_PROVIDER_SITE_OTHER): Payer: Medicare HMO | Admitting: Internal Medicine

## 2013-12-02 ENCOUNTER — Encounter: Payer: Self-pay | Admitting: Internal Medicine

## 2013-12-02 VITALS — BP 120/68 | HR 72 | Temp 97.9°F | Ht 73.0 in | Wt 216.0 lb

## 2013-12-02 DIAGNOSIS — T887XXA Unspecified adverse effect of drug or medicament, initial encounter: Secondary | ICD-10-CM

## 2013-12-02 DIAGNOSIS — I10 Essential (primary) hypertension: Secondary | ICD-10-CM

## 2013-12-02 DIAGNOSIS — E785 Hyperlipidemia, unspecified: Secondary | ICD-10-CM

## 2013-12-02 LAB — HEPATIC FUNCTION PANEL
ALT: 16 U/L (ref 0–53)
AST: 18 U/L (ref 0–37)
Albumin: 4 g/dL (ref 3.5–5.2)
Alkaline Phosphatase: 35 U/L — ABNORMAL LOW (ref 39–117)
Bilirubin, Direct: 0.1 mg/dL (ref 0.0–0.3)
Total Bilirubin: 0.9 mg/dL (ref 0.3–1.2)
Total Protein: 6.9 g/dL (ref 6.0–8.3)

## 2013-12-02 LAB — BASIC METABOLIC PANEL
BUN: 20 mg/dL (ref 6–23)
CO2: 25 mEq/L (ref 19–32)
Calcium: 9.1 mg/dL (ref 8.4–10.5)
Chloride: 105 mEq/L (ref 96–112)
Creatinine, Ser: 1.1 mg/dL (ref 0.4–1.5)
GFR: 71.3 mL/min (ref >60.00)
Glucose, Bld: 102 mg/dL — ABNORMAL HIGH (ref 70–99)
Potassium: 4.2 mEq/L (ref 3.5–5.1)
Sodium: 138 mEq/L (ref 135–145)

## 2013-12-02 LAB — LIPID PANEL
Cholesterol: 145 mg/dL (ref 0–200)
HDL: 36.2 mg/dL — ABNORMAL LOW (ref >39.00)
LDL Cholesterol: 84 mg/dL (ref 0–99)
Total CHOL/HDL Ratio: 4
Triglycerides: 125 mg/dL (ref 0.0–149.0)
VLDL: 25 mg/dL (ref 0.0–40.0)

## 2013-12-02 MED ORDER — BUTALBITAL-ACETAMINOPHEN 50-325 MG PO TABS: 1.0000 | ORAL_TABLET | Freq: Two times a day (BID) | ORAL | Status: DC | PRN

## 2013-12-02 NOTE — Progress Notes (Signed)
Subjective:    Patient ID: [name], male    DOB: 06-10-1941, 73 y.o.   MRN: [id number]  HPI Follow up for BPH, Lipids, GERD and CAD post CABG Lab monitoring for vytorin management and proscar management Flow is acceptable and has an appointment with GU New complaint of flashing light in right eye and head aches. Has been checked by opthalmology and the working diagnosis is migraine  Review of Systems  Constitutional: Positive for fatigue. Negative for fever.  HENT: Negative for congestion, hearing loss and postnasal drip.   Eyes: Negative for discharge, redness and visual disturbance.  Respiratory: Positive for chest tightness. Negative for cough, shortness of breath and wheezing.   Cardiovascular: Positive for chest pain. Negative for leg swelling.  Gastrointestinal: Negative for abdominal pain, constipation and abdominal distention.  Genitourinary: Positive for dysuria and frequency. Negative for urgency.  Musculoskeletal: Negative for arthralgias, joint swelling and neck pain.  Skin: Negative for color change and rash.  Neurological: Negative for weakness and light-headedness.  Hematological: Negative for adenopathy.  Psychiatric/Behavioral: Negative for behavioral problems.   Past Medical History  Diagnosis Date  . HYPERLIPIDEMIA 04/26/2007  . HYPERTENSION 04/30/2007  . 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 GERD 04/30/2007  . INGUINAL HERNIA, RIGHT 05/26/2010  . BENIGN PROSTATIC HYPERTROPHY, WITH URINARY OBSTRUCTION 09/06/2007  . 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 LOC OSTEOARTHROS NOT SPEC PRIM/SEC LOWER LEG 11/05/2009  . OSTEOARTHRITIS 04/26/2007  . 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 Edema 04/20/2010  . PSA, INCREASED 07/09/2008  . DVT, HX OF 04/26/2007  . Lumbar disc disease with radiculopathy   . Spinal stenosis of lumbar region   . History of shingles      History   Social History  . Marital Status: Married    Spouse Name: [name]    Number of Children: [name]  . Years of Education: [name]   Occupational History  . Not on file.   Social History Main Topics  . Smoking status: Former Smoker    Quit date: 08/15/1988  . Smokeless tobacco: Never Used  . Alcohol Use: No  . Drug Use: No  . Sexual Activity: Yes   Other Topics Concern  . Not on file   Social History Narrative  . No narrative on file    Past Surgical History  Procedure Laterality Date  . Appendectomy    . Coronary artery bypass graft      3vessel  in 1990  . Cataract extraction, bilateral    . Joint replacement      both hips in 2002  . Spine surgery  1984    two ruptured discs/fusion  . Knee arthroscopy      x 2  . Cystoscopy  12/23/10    Family History  Problem Relation Age of Onset  . Stroke Mother   . Heart attack Mother   . Diabetes Mother   . Stroke Father   . Heart disease Father   . Cancer Sister   . Diabetes Brother     Allergies  Allergen Reactions  . Morphine   . Rofecoxib     Current Outpatient Prescriptions on File Prior to Visit  Medication Sig Dispense Refill  . aspirin 81 MG tablet Take 81 mg by mouth daily.        Marland Kitchen ezetimibe-simvastatin (VYTORIN) 10-40 MG per tablet Take 1 tablet by mouth at bedtime. Monday Wednesday  and Friday      . finasteride (PROSCAR) 5 MG tablet Take 5 mg by mouth daily.        Marland Kitchen NITROSTAT 0.4 MG SL tablet PLACE 1 TABLET UNDER THE TONGUE EVERY 5 MINUTES AS NEEDED.  25 tablet  1  . omeprazole (PRILOSEC) 40 MG capsule Take 1 capsule (40 mg total) by mouth daily.       No current facility-administered medications on file prior to visit.    BP 120/68  Pulse 72  Temp(Src) 97.9 F (36.6 C) (Oral)  Ht 6\' 1"  (1.854 m)  Wt 216 lb (97.977 kg)  BMI 28.50 kg/m2  SpO2 96%        Objective:   Physical Exam  Constitutional: He is oriented to person, place, and time. He appears well-developed and  well-nourished.  HENT:  Head: Normocephalic and atraumatic.  Eyes: Conjunctivae are normal. Pupils are equal, round, and reactive to light.  Neck: Normal range of motion. Neck supple.  Cardiovascular: Normal rate and regular rhythm.   Pulmonary/Chest: Effort normal and breath sounds normal.  Abdominal: Soft. Bowel sounds are normal.  Genitourinary: Rectum normal.  BPH  Musculoskeletal: He exhibits tenderness.  Neurological: He is alert and oriented to person, place, and time.  Psychiatric: He has a normal mood and affect. His behavior is normal.          Assessment & Plan:  Stable HTN and CAD with stable chest pain pattern with moderate exertion Had stress test and follow up [facility] cardiology Has been increasing the lipid medication to reach goal Needs recheck today Increased migraines with allergies Add bupap for migraines  All immunizations and health maintenance protocols were reviewed with the patient and needed orders were placed.  Appropriate screening laboratory values were ordered for the patient including screening of hyperlipidemia, renal function and hepatic function. If indicated by BPH, a PSA was ordered.  Medication reconciliation,  past medical history, social history, problem list and allergies were reviewed in detail with the patient  Goals were established with regard to weight loss, exercise, and  diet in compliance with medications  End of life planning was discussed.  Monitoring for lipids Check psa by urology on medications for chronic BPH Labs ordered

## 2013-12-02 NOTE — Patient Instructions (Signed)
Next visit will be CPX in fall with new MD

## 2013-12-02 NOTE — Progress Notes (Signed)
Pre visit review using our clinic review tool, if applicable. No additional management support is needed unless otherwise documented below in the visit note. 

## 2013-12-03 ENCOUNTER — Telehealth: Payer: Self-pay | Admitting: Internal Medicine

## 2013-12-03 NOTE — Telephone Encounter (Signed)
Relevant patient education assigned to patient using Emmi. ° °

## 2013-12-25 ENCOUNTER — Telehealth: Payer: Self-pay | Admitting: Internal Medicine

## 2013-12-25 NOTE — Telephone Encounter (Signed)
Pt needs a referral to dr Nehemiah Massed dermatologist for follow up skin check. Phone # [phone] AND FAX [phone] Pt has [employer] ins. Pt has appt 03-17-14.

## 2013-12-26 ENCOUNTER — Telehealth: Payer: Self-pay | Admitting: Internal Medicine

## 2013-12-26 NOTE — Telephone Encounter (Addendum)
Submit a [employer] referral for pt to see  [place]: Ralene Bathe MD  Address: [address], [place], [place] [address]  [phone] Pt appt 03-17-2014  awaiting authorization   Received an authorization from [employer] Authorization # [id number] start 03-17-2014 end 06-15-2014 #of visit 6

## 2014-01-16 ENCOUNTER — Telehealth: Payer: Self-pay | Admitting: Internal Medicine

## 2014-01-16 DIAGNOSIS — N411 Chronic prostatitis: Secondary | ICD-10-CM

## 2014-01-16 NOTE — Telephone Encounter (Signed)
Pt called to request a referral to Dr Kathie Rhodes at [facility]   Pt has an appt with them 01/30/14 at 11am

## 2014-01-17 NOTE — Telephone Encounter (Signed)
Ok per Dr Jenkins, referral order placed 

## 2014-01-24 ENCOUNTER — Encounter: Payer: Self-pay | Admitting: Physician Assistant

## 2014-01-24 ENCOUNTER — Ambulatory Visit (INDEPENDENT_AMBULATORY_CARE_PROVIDER_SITE_OTHER): Payer: Medicare HMO | Admitting: Physician Assistant

## 2014-01-24 VITALS — BP 136/68 | HR 78 | Temp 98.6°F | Wt 220.0 lb

## 2014-01-24 DIAGNOSIS — H6980 Other specified disorders of Eustachian tube, unspecified ear: Secondary | ICD-10-CM

## 2014-01-24 DIAGNOSIS — R42 Dizziness and giddiness: Secondary | ICD-10-CM

## 2014-01-24 LAB — HEMOGLOBIN A1C: Hgb A1c MFr Bld: 6.3 % (ref 4.6–6.5)

## 2014-01-24 LAB — TSH: TSH: 2.37 u[IU]/mL (ref 0.35–4.50)

## 2014-01-24 NOTE — Patient Instructions (Signed)
We will call with your lab results when available.  Plain Over the Counter Mucinex (NOT Mucinex D) for thick secretions  Force NON dairy fluids, drinking plenty of water is best.    Over the Counter Flonase (alcohol) OR Nasacort (water based) AQ 1 spray in each nostril twice a day as needed. Use the "crossover" technique into opposite nostril spraying toward opposite ear @ 45 degree angle, not straight up into nostril.   Plain Over the Counter Allegra (NOT D )  160 daily , OR Loratidine 10 mg , OR Zyrtec 10 mg @ bedtime  as needed for itchy eyes & sneezing.  Saline Irrigation and Saline Sprays can also help reduce symptoms.  If the emergency symptoms discussed at visit develop, seek medical attention immediately.    Plan to follow up as needed, or for worsening or persistent symptoms despite treatment.   Dizziness  Dizziness means you feel unsteady or lightheaded. You might feel like you are going to pass out (faint). HOME CARE   Drink enough fluids to keep your pee (urine) clear or pale yellow.  Take your medicines exactly as told by your doctor. If you take blood pressure medicine, always stand up slowly from the lying or sitting position. Hold on to something to steady yourself.  If you need to stand in one place for a long time, move your legs often. Tighten and relax your leg muscles.  Have someone stay with you until you feel okay.  Do not drive or use heavy machinery if you feel dizzy.  Do not drink alcohol. GET HELP RIGHT AWAY IF:   You feel dizzy or lightheaded and it gets worse.  You feel sick to your stomach (nauseous), or you throw up (vomit).  You have trouble talking or walking.  You feel weak or have trouble using your arms, hands, or legs.  You cannot think clearly or have trouble forming sentences.  You have chest pain, belly (abdominal) pain, sweating, or you are short of breath.  Your vision changes.  You are bleeding.  You have problems from your  medicine that seem to be getting worse. MAKE SURE YOU:   Understand these instructions.  Will watch your condition.  Will get help right away if you are not doing well or get worse. Document Released: 07/21/2011 Document Revised: 10/24/2011 Document Reviewed: 07/21/2011 [facility] Patient Information 2014 [place], [place].

## 2014-01-24 NOTE — Progress Notes (Signed)
Subjective:    Patient ID: [name], male    DOB: Dec 19, 1940, 73 y.o.   MRN: [id number]  Dizziness This is a new problem. The current episode started more than 1 month ago. The problem occurs intermittently. The problem has been gradually worsening (worsening over the last few days). Pertinent negatives include no abdominal pain, anorexia, arthralgias, change in bowel habit, chest pain, chills, congestion, coughing, diaphoresis, fatigue, fever, headaches, joint swelling, myalgias, nausea, neck pain, numbness, rash, sore throat, swollen glands, urinary symptoms, vertigo, visual change, vomiting or weakness. The symptoms are aggravated by bending (not eating). He has tried nothing for the symptoms.      Review of Systems  Constitutional: Negative for fever, chills, diaphoresis, activity change, appetite change, fatigue and unexpected weight change.  HENT: Negative for congestion and sore throat.   Respiratory: Negative for cough and shortness of breath.   Cardiovascular: Negative for chest pain and palpitations.  Gastrointestinal: Negative for nausea, vomiting, abdominal pain, diarrhea, anorexia and change in bowel habit.  Musculoskeletal: Negative for arthralgias, joint swelling, myalgias and neck pain.  Skin: Negative for rash.  Neurological: Positive for dizziness and light-headedness. Negative for vertigo, tremors, seizures, syncope, facial asymmetry, speech difficulty, weakness, numbness and headaches.  All other systems reviewed and are negative.  Past Medical History  Diagnosis Date  . HYPERLIPIDEMIA 04/26/2007  . HYPERTENSION 04/30/2007  . 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 GERD 04/30/2007  . INGUINAL HERNIA, RIGHT 05/26/2010  . BENIGN PROSTATIC HYPERTROPHY, WITH URINARY OBSTRUCTION 09/06/2007  . 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 LOC OSTEOARTHROS NOT SPEC PRIM/SEC LOWER  LEG 11/05/2009  . OSTEOARTHRITIS 04/26/2007  . 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 Edema 04/20/2010  . PSA, INCREASED 07/09/2008  . DVT, HX OF 04/26/2007  . Lumbar disc disease with radiculopathy   . Spinal stenosis of lumbar region   . History of shingles    Past Surgical History  Procedure Laterality Date  . Appendectomy    . Coronary artery bypass graft      3vessel  in 1990  . Cataract extraction, bilateral    . Joint replacement      both hips in 2002  . Spine surgery  1984    two ruptured discs/fusion  . Knee arthroscopy      x 2  . Cystoscopy  12/23/10    reports that he quit smoking about 25 years ago. He has never used smokeless tobacco. He reports that he does not drink alcohol or use illicit drugs. family history includes Cancer in his sister; Diabetes in his brother and mother; Heart attack in his mother; Heart disease in his father; Stroke in his father and mother. Allergies  Allergen Reactions  . Morphine   . Rofecoxib        Objective:   Physical Exam  Nursing note and vitals reviewed. Constitutional: He is oriented to person, place, and time. He appears well-developed and well-nourished. No distress.  HENT:  Head: Normocephalic and atraumatic.  Right Ear: External ear normal.  Left Ear: External ear normal.  Nose: Nose normal.  Mouth/Throat: No oropharyngeal exudate.  Oropharynx is slightly erythematous with cobblestoning, no exudate.  Bilat TMs Retracted, otherwise normal.  Bilat front/max sinuses Non-TTP.  Eyes: Conjunctivae and EOM are normal. Pupils are equal, round, and reactive to light.  Neck: Normal range of motion. Neck supple. No JVD present.  Cardiovascular: Normal rate,  regular rhythm, normal heart sounds and intact distal pulses.  Exam reveals no gallop and no friction rub.   No murmur heard. Pulmonary/Chest: Effort normal and breath sounds normal. No stridor. No respiratory distress. He has no wheezes. He has no rales. He exhibits no  tenderness.  Musculoskeletal: Normal range of motion.  Lymphadenopathy:    He has no cervical adenopathy.  Neurological: He is alert and oriented to person, place, and time.  Skin: Skin is warm and dry. No rash noted. He is not diaphoretic. No erythema. No pallor.  Psychiatric: He has a normal mood and affect. His behavior is normal. Judgment and thought content normal.    Filed Vitals:   01/24/14 1537 01/24/14 1558  BP: 150/70 136/68  Pulse: 78   Temp: 98.6 F (37 C)   TempSrc: Oral   Weight: 220 lb (99.791 kg)     Lab Results  Component Value Date   WBC 4.6 01/10/2012   HGB 14.3 01/10/2012   HCT 42.3 01/10/2012   PLT 179.0 01/10/2012   GLUCOSE 102* 12/02/2013   CHOL 145 12/02/2013   TRIG 125.0 12/02/2013   HDL 36.20* 12/02/2013   LDLDIRECT 150.0 11/09/2012   LDLCALC 84 12/02/2013   ALT 16 12/02/2013   AST 18 12/02/2013   NA 138 12/02/2013   K 4.2 12/02/2013   CL 105 12/02/2013   CREATININE 1.1 12/02/2013   BUN 20 12/02/2013   CO2 25 12/02/2013   TSH 2.48 01/10/2012   PSA 0.64 11/09/2012   INR 1.00 03/18/2010   HGBA1C 6.0 09/19/2006        Assessment & Plan:  [name] was seen today for dizziness.  Diagnoses and associated orders for this visit:  Dizziness and giddiness - TSH - Hemoglobin A1c  ETD (eustachian tube dysfunction) Comments: On allegra, will add OTC nasal steroid spray. - TSH - Hemoglobin A1c   Return precautions provided, and pt handout on dizziness.  Plan to follow up as needed, or for worsening or persistent symptoms despite treatment.   Patient Instructions  We will call with your lab results when available.  Plain Over the Counter Mucinex (NOT Mucinex D) for thick secretions  Force NON dairy fluids, drinking plenty of water is best.    Over the Counter Flonase (alcohol) OR Nasacort (water based) AQ 1 spray in each nostril twice a day as needed. Use the "crossover" technique into opposite nostril spraying toward opposite ear @ 45 degree angle, not  straight up into nostril.   Plain Over the Counter Allegra (NOT D )  160 daily , OR Loratidine 10 mg , OR Zyrtec 10 mg @ bedtime  as needed for itchy eyes & sneezing.  Saline Irrigation and Saline Sprays can also help reduce symptoms.  If the emergency symptoms discussed at visit develop, seek medical attention immediately.    Plan to follow up as needed, or for worsening or persistent symptoms despite treatment.

## 2014-01-24 NOTE — Progress Notes (Signed)
Pre visit review using our clinic review tool, if applicable. No additional management support is needed unless otherwise documented below in the visit note. 

## 2014-03-20 ENCOUNTER — Ambulatory Visit (INDEPENDENT_AMBULATORY_CARE_PROVIDER_SITE_OTHER): Payer: Commercial Managed Care - HMO | Admitting: Family Medicine

## 2014-03-20 ENCOUNTER — Encounter: Payer: Self-pay | Admitting: Family Medicine

## 2014-03-20 VITALS — BP 154/78 | HR 72 | Temp 98.1°F | Wt 221.0 lb

## 2014-03-20 DIAGNOSIS — I25810 Atherosclerosis of coronary artery bypass graft(s) without angina pectoris: Secondary | ICD-10-CM

## 2014-03-20 DIAGNOSIS — Z789 Other specified health status: Secondary | ICD-10-CM

## 2014-03-20 DIAGNOSIS — Z9189 Other specified personal risk factors, not elsewhere classified: Secondary | ICD-10-CM

## 2014-03-20 DIAGNOSIS — I10 Essential (primary) hypertension: Secondary | ICD-10-CM

## 2014-03-20 DIAGNOSIS — E785 Hyperlipidemia, unspecified: Secondary | ICD-10-CM

## 2014-03-20 NOTE — Assessment & Plan Note (Signed)
Continue vytorin. Last ldl 84, suspect <70 on vytorin.

## 2014-03-20 NOTE — Assessment & Plan Note (Signed)
a1c 6.3. Advised diet/exercise. Repeat in 1 year. Discussed that statin may increase risk of DM but likely cardiac benefits outweigh risks.

## 2014-03-20 NOTE — Progress Notes (Signed)
[name] Reddish, MD Phone: [phone]  Subjective:  Patient presents today to establish care. Chief complaint-noted.   Hypertension BP Readings from Last 3 Encounters:  03/20/14 154/78  01/24/14 136/68  12/02/13 120/68  Home BP monitoring- SBP at home 120-130, DBNP 50-60.  Compliant with medications-no medications ROS-Denies any CP, HA, SOB, blurry vision. Mild edema depending on diet.   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 Vytorin and aspirin. 08/05/13 Myocardial perfusion study-no EKG changes.  ROS- previously with chest pain improved with belching, better with prilosec  Hyperlipidemia On statin: yes in combo with zetia Regular exercise: no and admits to slow weight gain of 15 lbs over last year, advised of regular exercise as well as no pain ROS- no chest pain or shortness of breath. Has some mild leg cramping better when taking vytorin MWF   The following were reviewed and entered/updated in epic: Past Medical History  Diagnosis Date  . HYPERLIPIDEMIA 04/26/2007  . HYPERTENSION 04/30/2007  . 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 GERD 04/30/2007  . INGUINAL HERNIA, RIGHT 05/26/2010  . BENIGN PROSTATIC HYPERTROPHY, WITH URINARY OBSTRUCTION 09/06/2007  . 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 LOC OSTEOARTHROS NOT SPEC PRIM/SEC LOWER LEG 11/05/2009  . OSTEOARTHRITIS 04/26/2007  . 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 Edema 04/20/2010  . PSA, INCREASED 07/09/2008  . DVT, HX OF 04/26/2007  . Lumbar disc disease with radiculopathy   . Spinal stenosis of lumbar region   . History of shingles    Patient Active Problem List   Diagnosis Date Noted  . CAD, ARTERY BYPASS GRAFT 08/11/2009    Priority: High  . Occlusion and stenosis of carotid artery without mention of cerebral infarction 07/09/2008    Priority: High  . DVT, HX OF 04/26/2007    Priority: High  . PERIPHERAL VASCULAR DISEASE  WITH CLAUDICATION 08/11/2009    Priority: Medium  . BENIGN PROSTATIC HYPERTROPHY, WITH URINARY OBSTRUCTION 09/06/2007    Priority: Medium  . HYPERTENSION 04/30/2007    Priority: Medium  . HYPERLIPIDEMIA 04/26/2007    Priority: Medium  . Chest pain, atypical 09/12/2013    Priority: Low  . Syncopal vertigo 11/04/2010    Priority: Low  . Lumbar disc disease with radiculopathy 11/04/2010    Priority: Low  . INGUINAL HERNIA, RIGHT 05/26/2010    Priority: Low  . Edema 04/20/2010    Priority: Low  . 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 GERD 04/30/2007    Priority: Low  . OSTEOARTHRITIS 04/26/2007    Priority: Low  . At risk for diabetes mellitus 03/20/2014   Past Surgical History  Procedure Laterality Date  . Appendectomy    . Coronary artery bypass graft      3vessel  in 1990  . Cataract extraction, bilateral    . Joint replacement      both hips in 2002  . Spine surgery  1984    two ruptured discs/fusion  . Knee arthroscopy      x 2  . Cystoscopy  12/23/10    Family History  Problem Relation Age of Onset  . Stroke Mother   . Heart attack Mother   . Diabetes Mother   . Stroke Father   . Heart disease Father   . Cancer Sister   . Diabetes Brother     Medications- reviewed and updated Current Outpatient Prescriptions  Medication Sig Dispense Refill  .  ACETAMINOPHEN-BUTALBITAL 50-325 MG TABS Take 1 tablet by mouth 2 (two) times daily as needed.  60 each  2  . aspirin 81 MG tablet Take 81 mg by mouth daily.        Marland Kitchen ezetimibe-simvastatin (VYTORIN) 10-40 MG per tablet Take 1 tablet by mouth at bedtime. Monday Wednesday and Friday      . finasteride (PROSCAR) 5 MG tablet Take 5 mg by mouth daily.        Marland Kitchen NITROSTAT 0.4 MG SL tablet PLACE 1 TABLET UNDER THE TONGUE EVERY 5 MINUTES AS NEEDED.  25 tablet  1  . omeprazole (PRILOSEC) 40 MG capsule Take 1 capsule (40 mg total) by mouth daily.       No current facility-administered medications for  this visit.    Allergies-reviewed and updated Allergies  Allergen Reactions  . Morphine   . Rofecoxib     History   Social History  . Marital Status: Married    Spouse Name: [name]    Number of Children: [name]  . Years of Education: [name]   Social History Main Topics  . Smoking status: Former Smoker -- 1.00 packs/day for 25 years    Types: Cigarettes    Quit date: 08/15/1988  . Smokeless tobacco: Never Used  . Alcohol Use: No  . Drug Use: No  . Sexual Activity: Yes   Other Topics Concern  . 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    Married, wife [name], for 25 years in 2016. 1 daughter and 3 grandkids from first marriage.    Hobbies: part time farmer, raise goats and chickens. Volunteer work through [employer].     ROS--See HPI   Objective: BP 154/78  Pulse 72  Temp(Src) 98.1 F (36.7 C)  Wt 221 lb (100.245 kg) Gen: NAD, resting comfortably in chair HEENT: Mucous membranes are moist. Oropharynx normal Neck: no thyromegaly, small likely lipoma midline of neck CV: RRR no murmurs rubs or gallops Lungs: CTAB no crackles, wheeze, rhonchi Abdomen: soft/nontender/nondistended/normal bowel sounds. No rebound or guarding.  Ext: trace edema Skin: warm, dry, no rash Neuro: grossly normal, moves all extremities, PERRLA  Assessment/Plan:  CAD, ARTERY BYPASS GRAFT Continue aspirin and vytorin. Previous symptoms of atypical chest pain improved on PPI.   HYPERLIPIDEMIA Continue vytorin. Last ldl 84, suspect <70 on vytorin.   HYPERTENSION Poor control today when previously well controlled with diet (states gluten free diet he used to use)/exercise. Patient's home readings not c/w readings here. Asked him to return with log of blood pressures in 1 month and work on his diet.   At risk for diabetes mellitus a1c 6.3. Advised diet/exercise. Repeat in 1 year. Discussed that statin may increase risk of DM but likely  cardiac benefits outweigh risks.

## 2014-03-20 NOTE — Patient Instructions (Signed)
I would ask that you check your blood pressure at home over the next month. Work on light amount of exercise each day as long as no chest pain. Consider the old gluten free diet. See me back in a month and bring your home cuff so we can compare.

## 2014-03-20 NOTE — Assessment & Plan Note (Signed)
Poor control today when previously well controlled with diet (states gluten free diet he used to use)/exercise. Patient's home readings not c/w readings here. Asked him to return with log of blood pressures in 1 month and work on his diet.

## 2014-03-20 NOTE — Assessment & Plan Note (Signed)
Continue aspirin and vytorin. Previous symptoms of atypical chest pain improved on PPI.

## 2014-04-17 ENCOUNTER — Ambulatory Visit (INDEPENDENT_AMBULATORY_CARE_PROVIDER_SITE_OTHER): Payer: Medicare HMO | Admitting: Family Medicine

## 2014-04-17 ENCOUNTER — Encounter: Payer: Self-pay | Admitting: Family Medicine

## 2014-04-17 VITALS — BP 163/71 | HR 66 | Temp 97.7°F | Ht 73.0 in | Wt 220.0 lb

## 2014-04-17 DIAGNOSIS — I10 Essential (primary) hypertension: Secondary | ICD-10-CM

## 2014-04-17 DIAGNOSIS — Z23 Encounter for immunization: Secondary | ICD-10-CM

## 2014-04-17 NOTE — Assessment & Plan Note (Signed)
Home cuff readings are within target goal <140/90 with CAD and actually <135/85. Cuff correlated in office -diastolic on home cuff reading higher in office but no highs at home. No current medications needed-there is definitely a white coat element here.

## 2014-04-17 NOTE — Addendum Note (Signed)
Addended by: Aggie Hacker A on: 04/17/2014 09:37 AM   Modules accepted: Orders

## 2014-04-17 NOTE — Patient Instructions (Signed)
Blood Pressure  Your home cuff is accurate and your home numbers are good.   No blood pressure medicine at this time.   Please continue to monitor. If you are regularly over 140 on the top number or 90 on the bottom, please come see [place]. Always rest 5 minutes before taking and morning is a good time to check  Otherwise see [place] every 6 months  Health Maintenance Due  Topic Date Due  . Colonoscopy - we will update computer 06/17/2012  . Influenza Vaccine -received today 03/15/2014

## 2014-04-17 NOTE — Progress Notes (Signed)
Pre visit review using our clinic review tool, if applicable. No additional management support is needed unless otherwise documented below in the visit note. 

## 2014-04-17 NOTE — Progress Notes (Signed)
  Garret Reddish, MD Phone: [phone]  Subjective:   [name] is a 73 y.o. year old very pleasant male patient who presents with the following:  Hypertension ? White coat BP Readings from Last 3 Encounters:  04/17/14 163/71  03/20/14 154/78  01/24/14 136/68  Home BP monitoring-resting morning blood pressures 242-683 systolic6 4-19 diastolic Compliant with medications-no BP medications ROS-Denies any CP, HA, SOB, blurry vision, changes in LE edema.   Past Medical History- Patient Active Problem List   Diagnosis Date Noted  . CAD, ARTERY BYPASS GRAFT 08/11/2009    Priority: High  . Occlusion and stenosis of carotid artery without mention of cerebral infarction 07/09/2008    Priority: High  . DVT, HX OF 04/26/2007    Priority: High  . PERIPHERAL VASCULAR DISEASE WITH CLAUDICATION 08/11/2009    Priority: Medium  . BENIGN PROSTATIC HYPERTROPHY, WITH URINARY OBSTRUCTION 09/06/2007    Priority: Medium  . HYPERTENSION 04/30/2007    Priority: Medium  . HYPERLIPIDEMIA 04/26/2007    Priority: Medium  . Chest pain, atypical 09/12/2013    Priority: Low  . Syncopal vertigo 11/04/2010    Priority: Low  . Lumbar disc disease with radiculopathy 11/04/2010    Priority: Low  . INGUINAL HERNIA, RIGHT 05/26/2010    Priority: Low  . Edema 04/20/2010    Priority: Low  . 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 GERD 04/30/2007    Priority: Low  . OSTEOARTHRITIS 04/26/2007    Priority: Low  . At risk for diabetes mellitus 03/20/2014   Medications- reviewed and updated Current Outpatient Prescriptions  Medication Sig Dispense Refill  . aspirin 81 MG tablet Take 81 mg by mouth daily.        Marland Kitchen ezetimibe-simvastatin (VYTORIN) 10-40 MG per tablet Take 1 tablet by mouth at bedtime. Monday Wednesday and Friday      . finasteride (PROSCAR) 5 MG tablet Take 5 mg by mouth daily.        . ACETAMINOPHEN-BUTALBITAL 50-325 MG TABS Take 1 tablet by mouth 2 (two)  times daily as needed.  60 each  2  . NITROSTAT 0.4 MG SL tablet PLACE 1 TABLET UNDER THE TONGUE EVERY 5 MINUTES AS NEEDED.  25 tablet  1  . omeprazole (PRILOSEC) 40 MG capsule Take 1 capsule (40 mg total) by mouth daily.       No current facility-administered medications for this visit.    Objective: BP 163/71  Pulse 66  Temp(Src) 97.7 F (36.5 C)  Ht 6\' 1"  (1.854 m)  Wt 220 lb (99.791 kg)  BMI 29.03 kg/m2 Gen: NAD, resting comfortably CV: RRR no murmurs rubs or gallops Lungs: CTAB no crackles, wheeze, rhonchi Ext: trace edema  Home cuff reading 164/93, my reading 162/78. Systolic accurate. Diastolic above actual value on home monitor.   Assessment/Plan:  HYPERTENSION Home cuff readings are within target goal <140/90 with CAD and actually <135/85. Cuff correlated in office -diastolic on home cuff reading higher in office but no highs at home. No current medications needed-there is definitely a white coat element here.    COunseling provided on reasons to return and for regular BP readings at home at least 2x a week in AM.

## 2014-05-08 ENCOUNTER — Ambulatory Visit (INDEPENDENT_AMBULATORY_CARE_PROVIDER_SITE_OTHER): Payer: Medicare HMO | Admitting: Cardiovascular Disease

## 2014-05-08 ENCOUNTER — Encounter: Payer: Self-pay | Admitting: Cardiovascular Disease

## 2014-05-08 VITALS — BP 153/82 | HR 57 | Ht 73.0 in | Wt 220.0 lb

## 2014-05-08 DIAGNOSIS — I6529 Occlusion and stenosis of unspecified carotid artery: Secondary | ICD-10-CM

## 2014-05-08 DIAGNOSIS — I25810 Atherosclerosis of coronary artery bypass graft(s) without angina pectoris: Secondary | ICD-10-CM

## 2014-05-08 DIAGNOSIS — I10 Essential (primary) hypertension: Secondary | ICD-10-CM

## 2014-05-08 DIAGNOSIS — E785 Hyperlipidemia, unspecified: Secondary | ICD-10-CM

## 2014-05-08 DIAGNOSIS — R0789 Other chest pain: Secondary | ICD-10-CM

## 2014-05-08 NOTE — Patient Instructions (Signed)
You are doing well. No medication changes were made.  Please monitor your blood pressure  Call if blood pressure runs high  Please call us if you have new issues that need to be addressed before your next appt.  Your physician wants you to follow-up in: 6 months.  You will receive a reminder letter in the mail two months in advance. If you don't receive a letter, please call our office to schedule the follow-up appointment.

## 2014-05-08 NOTE — Assessment & Plan Note (Signed)
Blood pressure running high today. Also suspect blood pressure running high at home. we have discussed the blood pressure guidelines. He is not particularly interested in starting a medication at this time

## 2014-05-08 NOTE — Assessment & Plan Note (Signed)
Currently with no symptoms of angina. No further workup at this time. Continue current medication regimen. 

## 2014-05-08 NOTE — Assessment & Plan Note (Signed)
Carotid disease estimated at 40-50% in 2014. We'll discuss repeat ultrasound with him in followup

## 2014-05-08 NOTE — Assessment & Plan Note (Signed)
Encouraged him to stay on his Vytorin every other day as tolerated. Goal LDL less than 70. Samples provided

## 2014-05-08 NOTE — Progress Notes (Signed)
Patient ID: [name], male    DOB: 1941-06-14, 74 y.o.   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 He has a AAA less than 3 cm, mild bilateral carotid arterial disease estimated at 40-50% bilaterally. Numerous previous episode of chest pain relieved with belching consistent with GI etiology. He presents for routine followup Previous episodes of chest pain sometimes relieved with belching. Prior stress test December 2014 showing no ischemia He has a diagnosis of obstructive sleep apnea. Could not handle CPAP despite numerous attempts. Symptoms better after weight loss total hip replacements x2  On today's visit, he reports that he has been doing well. Blood pressure elevated today but he reports it is well controlled at home. When asked where his blood pressure normally runs, he reported sometimes 140, sometimes 150. He is not very interested in starting a blood pressure medication. His weight has been an issue and continues to rise Is not doing any regular exercise. Previous he was going to the gym, has not been going recently He takes one half vytorin every other day. On higher doses has myalgias No recent symptoms of chest pain   went to the emergency room in [facility] 07/03/2013 with chest pain. Had a CT scan of his chest which was essentially benign. Nitroglycerin did not help his pain. He ruled out and was sent home.  Repeat episode of chest pain 08/05/2013 with stress test at that time showing no significant ischemia, ejection fraction 48%. It was felt his chest pain was atypical in nature. She feels it was from GI etiology given his negative stress test.  He does report having some cold induced chest tightness/shortness of breath, asthma.    episode of chest pain 05/14/2013. He felt  was indigestion. He took several nitroglycerin with no significant improvement in his symptoms. Symptoms were severe, felt similar to his previous MI. He got in his car and on the way, had a big burp. Symptoms seemed to resolve at that time. He continued on to the emergency room. There cardiac enzymes were negative x2, EKG unchanged. He reports having had prior chest pain episode relieved with belching in the past.   EKG shows normal sinus rhythm with rate 57 beats per minute, left axis deviation, no significant ST or T wave changes, poor R-wave progression through the anterior precordial leads       Outpatient Encounter Prescriptions as of 05/08/2014  Medication Sig  . ACETAMINOPHEN-BUTALBITAL 50-325 MG TABS Take 1 tablet by mouth 2 (two) times daily as needed.  Marland Kitchen aspirin 81 MG tablet Take 81 mg by mouth daily.   Marland Kitchen ezetimibe-simvastatin (VYTORIN) 10-40 MG per tablet Take 1 tablet by mouth at bedtime. Monday Wednesday and Friday  . finasteride (PROSCAR) 5 MG tablet Take 5 mg by mouth daily.    Marland Kitchen NITROSTAT 0.4 MG SL tablet PLACE 1 TABLET UNDER THE TONGUE EVERY 5 MINUTES AS NEEDED.  Marland Kitchen omeprazole (PRILOSEC) 40 MG capsule Take 1 capsule (40 mg total) by mouth daily.    Review of Systems  Constitutional: Negative.   HENT: Negative.   Eyes: Negative.   Respiratory: Negative.   Cardiovascular: Negative.   Gastrointestinal: Negative.   Endocrine: Negative.   Musculoskeletal: Positive for arthralgias, back pain and gait problem.  Skin: Negative.   Allergic/Immunologic: Negative.   Neurological: Negative.   Hematological:  Negative.   Psychiatric/Behavioral: Negative.   All other systems reviewed and are negative.   BP 153/82  Pulse 57  Ht 6\' 1"  (1.854 m)  Wt 220 lb (99.791 kg)  BMI 29.03 kg/m2  Physical Exam  Nursing note and vitals reviewed. Constitutional: He is oriented to person, place, and time. He appears well-developed and well-nourished.  HENT:  Head: Normocephalic.  Nose:  Nose normal.  Mouth/Throat: Oropharynx is clear and moist.  Eyes: Conjunctivae are normal. Pupils are equal, round, and reactive to light.  Neck: Normal range of motion. Neck supple. No JVD present.  Cardiovascular: Normal rate, regular rhythm, S1 normal, S2 normal, normal heart sounds and intact distal pulses.  Exam reveals no gallop and no friction rub.   No murmur heard. Pulmonary/Chest: Effort normal and breath sounds normal. No respiratory distress. He has no wheezes. He has no rales. He exhibits no tenderness.  Abdominal: Soft. Bowel sounds are normal. He exhibits no distension. There is no tenderness.  Musculoskeletal: Normal range of motion. He exhibits no edema and no tenderness.  Lymphadenopathy:    He has no cervical adenopathy.  Neurological: He is alert and oriented to person, place, and time. Coordination normal.  Skin: Skin is warm and dry. No rash noted. No erythema.  Psychiatric: He has a normal mood and affect. His behavior is normal. Judgment and thought content normal.      Assessment and Plan

## 2014-05-08 NOTE — Assessment & Plan Note (Signed)
No recent chest pain symptoms. He is relatively sedentary. No further workup. Last stress test December 2014 showing no ischemia

## 2014-06-10 ENCOUNTER — Telehealth: Payer: Self-pay | Admitting: Family Medicine

## 2014-06-10 NOTE — Telephone Encounter (Addendum)
Type of Insurance:[employer] ins  Do we have a copy of Insurance Card on File? yes  Patient's PCP:dr hunter  PCP's NPI: [id number]  Treating Provider:dr adam kendall  Treating Provider's EKC:[id number]  Treating Provider's Phone Number:[phone]  Treating Provider's Fax Number:[phone] pt has appt now 06/10/14. Pt fell hurt spine. Pt has seen mds at [facility] in past Visit (diagnosis): pt fell hurt spine

## 2014-06-19 ENCOUNTER — Other Ambulatory Visit (HOSPITAL_COMMUNITY): Payer: Self-pay | Admitting: *Deleted

## 2014-06-19 DIAGNOSIS — I6523 Occlusion and stenosis of bilateral carotid arteries: Secondary | ICD-10-CM

## 2014-06-25 ENCOUNTER — Telehealth: Payer: Self-pay | Admitting: Family Medicine

## 2014-06-25 ENCOUNTER — Ambulatory Visit (INDEPENDENT_AMBULATORY_CARE_PROVIDER_SITE_OTHER): Payer: Commercial Managed Care - HMO | Admitting: Family Medicine

## 2014-06-25 ENCOUNTER — Encounter: Payer: Self-pay | Admitting: Family Medicine

## 2014-06-25 VITALS — BP 142/76 | HR 98 | Temp 98.1°F | Ht 73.0 in | Wt 219.0 lb

## 2014-06-25 DIAGNOSIS — J0100 Acute maxillary sinusitis, unspecified: Secondary | ICD-10-CM

## 2014-06-25 MED ORDER — AZITHROMYCIN 250 MG PO TABS: ORAL_TABLET | ORAL | Status: DC

## 2014-06-25 NOTE — Telephone Encounter (Signed)
Patient Information:  Caller Name: Raiyan  Phone: [phone]  Patient: [name], [name]  Gender: Male  DOB: 10-11-1940  Age: 73 Years  PCP: Garret Reddish  Office Follow Up:  Does the office need to follow up with this patient?: No  Instructions For The Office: [name]   Symptoms  Reason For Call & Symptoms: Cold symptoms onset 06/18/14.  Green nasal drainage and sputum,  Pressure in left ear.  History of sinus problems.  Emergent symptoms ruled out.  See  Today or Tomorrow in Office per Colds guideline due to Using nasal washes and pain medicine > 24 hours and sinus pain persists.  Reviewed Health History In EMR: Yes  Reviewed Medications In EMR: Yes  Reviewed Allergies In EMR: Yes  Reviewed Surgeries / Procedures: Yes  Date of Onset of Symptoms: 06/18/2014  Treatments Tried: Dayquil, Delsym, Tylenol, Flonase  Treatments Tried Worked: No  Guideline(s) Used:  Colds  Disposition Per Guideline:   See Today or Tomorrow in Office  Reason For Disposition Reached:   Using nasal washes and pain medicine > 24 hours and sinus pain (lower forehead, cheekbone, or eye) persists  Advice Given:  Reassurance  Colds are very common and may make you feel uncomfortable.  Colds are caused by viruses, and no medicine or "shot" will cure an uncomplicated cold.  Treatment for Associated Symptoms of Colds:  For muscle aches, headaches, or moderate fever (more than 101 F or 38.9 C): Take acetaminophen every 4 hours.  Sore throat: Try throat lozenges, hard candy, or warm chicken broth.  Cough: Use cough drops.  Hydrate: Drink adequate liquids.  Humidifier:  If the air in your home is dry, use a cool-mist humidifier  Contagiousness:  Cover your nose and mouth with a tissue when you sneeze or cough.  Wash your hands frequently with soap and water.  You can return to work or school after the fever is gone and you feel well enough to participate in normal activities.  Call Back If:  Difficulty  breathing occurs  Nasal discharge lasts more than 10 days  Cough lasts more than 3 weeks  You become worse  Patient Will Follow Care Advice:  YES  Appointment Scheduled:  06/25/2014 15:30:00 Appointment Scheduled Provider:  Alysia Penna [facility])

## 2014-06-25 NOTE — Progress Notes (Signed)
Pre visit review using our clinic review tool, if applicable. No additional management support is needed unless otherwise documented below in the visit note. 

## 2014-06-26 ENCOUNTER — Encounter: Payer: Self-pay | Admitting: Family Medicine

## 2014-06-26 NOTE — Progress Notes (Signed)
   Subjective:    Patient ID: [name], male    DOB: [birth year], 73 y.o.   MRN: [id number]  HPI Here for one week of sinus pressure, PND, and coughing up yellow sputum. No fever.    Review of Systems  Constitutional: Negative.   HENT: Positive for congestion, postnasal drip and sinus pressure.   Eyes: Negative.   Respiratory: Positive for cough.        Objective:   Physical Exam  Constitutional: He appears well-developed and well-nourished.  HENT:  Right Ear: External ear normal.  Left Ear: External ear normal.  Nose: Nose normal.  Mouth/Throat: Oropharynx is clear and moist.  Eyes: Conjunctivae are normal.  Pulmonary/Chest: Effort normal and breath sounds normal.  Lymphadenopathy:    He has no cervical adenopathy.          Assessment & Plan:  Add Mucinex

## 2014-07-04 ENCOUNTER — Encounter (INDEPENDENT_AMBULATORY_CARE_PROVIDER_SITE_OTHER): Payer: Commercial Managed Care - HMO

## 2014-07-04 DIAGNOSIS — I6523 Occlusion and stenosis of bilateral carotid arteries: Secondary | ICD-10-CM

## 2014-07-15 DIAGNOSIS — J189 Pneumonia, unspecified organism: Secondary | ICD-10-CM

## 2014-07-15 HISTORY — DX: Pneumonia, unspecified organism: J18.9

## 2014-08-06 ENCOUNTER — Encounter: Payer: Self-pay | Admitting: Family Medicine

## 2014-08-06 ENCOUNTER — Ambulatory Visit (INDEPENDENT_AMBULATORY_CARE_PROVIDER_SITE_OTHER): Payer: Commercial Managed Care - HMO | Admitting: Family Medicine

## 2014-08-06 VITALS — BP 143/81 | HR 103 | Temp 98.4°F | Ht 73.0 in | Wt 223.0 lb

## 2014-08-06 DIAGNOSIS — J209 Acute bronchitis, unspecified: Secondary | ICD-10-CM

## 2014-08-06 MED ORDER — AMOXICILLIN-POT CLAVULANATE 875-125 MG PO TABS: 1.0000 | ORAL_TABLET | Freq: Two times a day (BID) | ORAL | Status: DC

## 2014-08-06 MED ORDER — HYDROCODONE-HOMATROPINE 5-1.5 MG/5ML PO SYRP: 5.0000 mL | ORAL_SOLUTION | ORAL | Status: DC | PRN

## 2014-08-06 NOTE — Progress Notes (Signed)
Pre visit review using our clinic review tool, if applicable. No additional management support is needed unless otherwise documented below in the visit note. 

## 2014-08-06 NOTE — Progress Notes (Signed)
   Subjective:    Patient ID: [name], male    DOB: Sep 13, 1940, 73 y.o.   MRN: [id number]  HPI Here for 4 days of chest tightness and coughing up green sputum. No fever.    Review of Systems  Constitutional: Negative.   HENT: Positive for congestion. Negative for postnasal drip and sinus pressure.   Eyes: Negative.   Respiratory: Positive for cough and chest tightness. Negative for shortness of breath.        Objective:   Physical Exam  Constitutional: He appears well-developed and well-nourished.  HENT:  Right Ear: External ear normal.  Left Ear: External ear normal.  Nose: Nose normal.  Mouth/Throat: Oropharynx is clear and moist.  Eyes: Conjunctivae are normal.  Pulmonary/Chest: Effort normal. No respiratory distress. He has no wheezes. He has no rales.  Scattered rhonchi   Lymphadenopathy:    He has no cervical adenopathy.          Assessment & Plan:  Add Mucinex

## 2014-08-07 ENCOUNTER — Telehealth: Payer: Self-pay | Admitting: Family Medicine

## 2014-08-07 ENCOUNTER — Emergency Department: Payer: Self-pay | Admitting: Emergency Medicine

## 2014-08-07 LAB — CBC
HCT: 44.7 % (ref 40.0–52.0)
HGB: 15.5 g/dL (ref 13.0–18.0)
MCH: 32.5 pg (ref 26.0–34.0)
MCHC: 34.7 g/dL (ref 32.0–36.0)
MCV: 94 fL (ref 80–100)
Platelet: 152 10*3/uL (ref 150–440)
RBC: 4.78 10*6/uL (ref 4.40–5.90)
RDW: 12.5 % (ref 11.5–14.5)
WBC: 5.8 10*3/uL (ref 3.8–10.6)

## 2014-08-07 LAB — BASIC METABOLIC PANEL
Anion Gap: 9 (ref 7–16)
BUN: 12 mg/dL (ref 7–18)
Calcium, Total: 8.9 mg/dL (ref 8.5–10.1)
Chloride: 100 mmol/L (ref 98–107)
Co2: 28 mmol/L (ref 21–32)
Creatinine: 1.28 mg/dL (ref 0.60–1.30)
EGFR (African American): >60
EGFR (Non-African Amer.): 59 — ABNORMAL LOW
Glucose: 113 mg/dL — ABNORMAL HIGH (ref 65–99)
Osmolality: 274 (ref 275–301)
Potassium: 3.9 mmol/L (ref 3.5–5.1)
Sodium: 137 mmol/L (ref 136–145)

## 2014-08-07 MED ORDER — ALBUTEROL SULFATE HFA 108 (90 BASE) MCG/ACT IN AERS: 2.0000 | INHALATION_SPRAY | Freq: Four times a day (QID) | RESPIRATORY_TRACT | Status: DC | PRN

## 2014-08-07 NOTE — Telephone Encounter (Signed)
Pt is aware rx has been sent to [facility]

## 2014-08-07 NOTE — Telephone Encounter (Signed)
Please see below.

## 2014-08-07 NOTE — Telephone Encounter (Signed)
He sent it in Mrs. Constance Holster

## 2014-08-07 NOTE — Telephone Encounter (Signed)
Pt saw dr fry yesterday and was given rx etc and last night he was wheezing. Pt would like to know if md can call inhaler into [facility], per sylvia call needs to go to triage. MD has left building. This phone note route to [place]

## 2014-08-07 NOTE — Telephone Encounter (Signed)
Sent in

## 2014-08-09 ENCOUNTER — Telehealth: Payer: Self-pay

## 2014-08-09 NOTE — Telephone Encounter (Signed)
[facility] Night - Client TELEPHONE [facility] Patient Name: [name] Gender: Male DOB: Oct 13, 1940 Age: 73 Y 1 M 7 D Return Phone Number: [phone] (Primary), [phone] (Secondary) Address: City/State/Zip: [place] Client [facility] Night - Client Client Site [facility] - Night Physician Garret Reddish Contact Type Call Call Type Triage / Clinical Relationship To Patient Self Return Phone Number [phone] (Primary) Chief Complaint WHEEZING Initial Comment caller has fever 102, and wheezing, [place] [facility] Not Listed [facility] ED PreDisposition Go to ED Nurse Assessment Nurse: Malva Cogan, RN, Juliann Pulse Date/Time Eilene Ghazi Time): 08/07/2014 6:13:46 PM Confirm and document reason for call. If symptomatic, describe symptoms. ---Caller states that he had onset of fever this afternoon with most recent temp being 102 orally, was seen in office yesterday & was diagnosed with bronchitis & was prescribed antibiotics. Caller states that he had onset of wheezing yesterday that got worse today & he called office & they prescribed Ventolin inhaler which he has used once but just obtained inhaler approx 1500 today. Has the patient traveled out of the country within the last 30 days? ---No Does the patient require triage? ---Yes Related visit to physician within the last 2 weeks? ---Yes Does the PT have any chronic conditions? (i.e. diabetes, asthma, etc.) ---Yes List chronic conditions. ---high cholesterol, acid reflux, enlarged prostate Guidelines Guideline Title Affirmed Question Affirmed Notes Nurse Date/Time (Eastern Time) Asthma Attack Asthma medicine (nebulizer or inhaler) is needed more frequently than q 4 hours to keep you comfortable Carola Rhine 08/07/2014 6:16:47 PM Disp. Time Eilene Ghazi Time) Disposition Final User 08/07/2014 6:07:50 PM Send to  Urgent Queue Sherrilyn Rist 08/07/2014 6:20:54 PM Go to ED Now (or PCP triage) Yes Malva Cogan, RN, Juliann Pulse PLEASE NOTE: All timestamps contained within this report are represented as [place] Standard Time. CONFIDENTIALTY NOTICE: This fax transmission is intended only for the addressee. It contains information that is legally privileged, confidential or otherwise protected from use or disclosure. If you are not the intended recipient, you are strictly prohibited from reviewing, disclosing, copying using or disseminating any of this information or taking any action in reliance on or regarding this information. If you have received this fax in error, please notify us immediately by telephone so that we can arrange for its return to [place]. Phone: [phone], Toll-Free: [phone], Fax: [phone] Page: 2 of 2 Call Id: [id number] Caller Understands: Yes Disagree/Comply: Comply Care Advice Given Per Guideline * IF NO PCP TRIAGE: You need to be seen. Go to the [facility] within the next hour. Leave as soon as you can. BRING MEDICINES: * Please bring a list of your current medicines when you go to see the doctor. * It is also a good idea to bring the pill bottles too. This will help the doctor to make certain you are taking the right medicines and the right dose. CALL BACK IF: * You become worse. CARE ADVICE given per Asthma Attack (Adult) guideline. After Care Instructions Given Call Event Type User Date / Time Description Comments User: Olena Mater, RN Date/Time (Eastern Time): 08/07/2014 6:22:12 PM Caller had coughing spell while triager was speaking with him & sounds as though he may benefit from oral steroids, advised that he will have to be re-examined for those to be prescribed if indicated. Referrals GO TO FACILITY OTHER - SPECIFY  Seen that Dr. Yong Channel sent in albuterol inhaler on 12.24.15

## 2014-08-10 ENCOUNTER — Emergency Department: Payer: Self-pay | Admitting: Emergency Medicine

## 2014-08-10 LAB — BASIC METABOLIC PANEL
Anion Gap: 9 (ref 7–16)
BUN: 25 mg/dL — ABNORMAL HIGH (ref 7–18)
Calcium, Total: 8.5 mg/dL (ref 8.5–10.1)
Chloride: 104 mmol/L (ref 98–107)
Co2: 25 mmol/L (ref 21–32)
Creatinine: 1.26 mg/dL (ref 0.60–1.30)
EGFR (African American): >60
EGFR (Non-African Amer.): 60 — ABNORMAL LOW
Glucose: 132 mg/dL — ABNORMAL HIGH (ref 65–99)
Osmolality: 282 (ref 275–301)
Potassium: 4.2 mmol/L (ref 3.5–5.1)
Sodium: 138 mmol/L (ref 136–145)

## 2014-08-10 LAB — CBC
HCT: 38.8 % — ABNORMAL LOW (ref 40.0–52.0)
HGB: 13.2 g/dL (ref 13.0–18.0)
MCH: 32.7 pg (ref 26.0–34.0)
MCHC: 34.1 g/dL (ref 32.0–36.0)
MCV: 96 fL (ref 80–100)
Platelet: 154 10*3/uL (ref 150–440)
RBC: 4.05 10*6/uL — ABNORMAL LOW (ref 4.40–5.90)
RDW: 12.8 % (ref 11.5–14.5)
WBC: 7.4 10*3/uL (ref 3.8–10.6)

## 2014-08-10 LAB — TROPONIN I: Troponin-I: <0.02 ng/mL

## 2014-08-11 NOTE — Telephone Encounter (Signed)
FYI

## 2014-08-13 ENCOUNTER — Telehealth: Payer: Self-pay | Admitting: Family Medicine

## 2014-08-18 NOTE — Telephone Encounter (Signed)
PLEASE NOTE: All timestamps contained within this report are represented as [place] Standard Time. CONFIDENTIALTY NOTICE: This fax transmission is intended only for the addressee. It contains information that is legally privileged, confidential or otherwise protected from use or disclosure. If you are not the intended recipient, you are strictly prohibited from reviewing, disclosing, copying using or disseminating any of this information or taking any action in reliance on or regarding this information. If you have received this fax in error, please notify us immediately by telephone so that we can arrange for its return to [place]. Phone: [phone], Toll-Free: [phone], Fax: [phone] Page: 1 of 2 Call Id: [id number] [facility] [place] Patient Name: [name] Gender: Male DOB: [birth year] Age: 74 Y 96 M 13 D Return Phone Number: [phone] (Primary) Address: City/State/Zip: [place] Client [facility] - Client Client Site [facility] - Day Physician Garret Reddish Contact Type Call Call Type Triage / Clinical Caller Name Trystan Relationship To Patient Self Return Phone Number [phone] (Primary) Chief Complaint Insomnia Initial Comment Caller states he went to the ER- pneumonia. DXcan't sleep. some wheezing. He is on Levaquin 500mg . On prescription cough medicine. [facility] PreDisposition Did not know what to do Nurse Assessment Nurse: Loletta Specter, RN, Wells Guiles Date/Time (Eastern Time): 08/13/2014 8:56:32 AM Confirm and document reason for call. If symptomatic, describe symptoms. ---Caller states he went to ED 12/24 and diagnosed with pneumonia and started on antibiotic. Went back to ED on 12/27 started on levaquin, still wheezing and insomnia. Pt also taking prednisone. Has the patient traveled out of the country  within the last 30 days? ---Not Applicable Does the patient require triage? ---Yes Related visit to physician within the last 2 weeks? ---No Does the PT have any chronic conditions? (i.e. diabetes, asthma, etc.) ---No Guidelines Guideline Title Affirmed Question Affirmed Notes Nurse Date/Time (Eastern Time) Breathing Difficulty [1] MODERATE difficulty breathing (e.g., speaks in phrases, SOB even at rest, pulse 100-120) AND [2] NEWonset or WORSE than normal Fredna Dow 08/13/2014 8:58:37 AM Disp. Time Eilene Ghazi Time) Disposition Final User 08/13/2014 9:00:07 AM Go to ED Now Yes Loletta Specter, RN, Wells Guiles PLEASE NOTE: All timestamps contained within this report are represented as [place] Standard Time. CONFIDENTIALTY NOTICE: This fax transmission is intended only for the addressee. It contains information that is legally privileged, confidential or otherwise protected from use or disclosure. If you are not the intended recipient, you are strictly prohibited from reviewing, disclosing, copying using or disseminating any of this information or taking any action in reliance on or regarding this information. If you have received this fax in error, please notify us immediately by telephone so that we can arrange for its return to [place]. Phone: [phone], Toll-Free: [phone], Fax: [phone] Page: 2 of 2 Call Id: [id number] Caller Understands: Yes Disagree/Comply: Comply Care Advice Given Per Guideline GO TO ED NOW: You need to be seen in the Emergency Department. Go to the ER at ___________ [place] now. Drive carefully. NOTE TO TRIAGER - DRIVING: * Another adult should drive. CARE ADVICE given per Breathing Difficulty (Adult) guideline. After Care Instructions Given Call Event Type User Date / Time Description Referrals GO TO FACILITY OTHER - SPECIFY

## 2014-08-28 ENCOUNTER — Ambulatory Visit (INDEPENDENT_AMBULATORY_CARE_PROVIDER_SITE_OTHER): Payer: PPO | Admitting: Family Medicine

## 2014-08-28 ENCOUNTER — Encounter: Payer: Self-pay | Admitting: Family Medicine

## 2014-08-28 VITALS — BP 122/64 | HR 82 | Temp 98.1°F | Wt 224.0 lb

## 2014-08-28 DIAGNOSIS — J329 Chronic sinusitis, unspecified: Secondary | ICD-10-CM | POA: Insufficient documentation

## 2014-08-28 DIAGNOSIS — A499 Bacterial infection, unspecified: Secondary | ICD-10-CM

## 2014-08-28 DIAGNOSIS — J189 Pneumonia, unspecified organism: Secondary | ICD-10-CM

## 2014-08-28 DIAGNOSIS — B9689 Other specified bacterial agents as the cause of diseases classified elsewhere: Secondary | ICD-10-CM

## 2014-08-28 MED ORDER — AMOXICILLIN-POT CLAVULANATE 875-125 MG PO TABS: 1.0000 | ORAL_TABLET | Freq: Two times a day (BID) | ORAL | Status: DC

## 2014-08-28 NOTE — Assessment & Plan Note (Signed)
Patient has a history of ENT referral for similar persistent symptoms several years ago and operation was suggested which patient declined. He has done better over the last few years but with recent pneumonia (clear lungs and symptoms improved) he has had recurrent sinus issues. I suggested referral for ENT and patient declines. We ultimately decided upon 21 day course of augmentin and if does not resolve, send to ENT at that time. Realize patient has been on levaquin for PNA but likely equal coverage with augmentin for sinuses.

## 2014-08-28 NOTE — Progress Notes (Signed)
Garret Reddish, MD Phone: [phone]  Subjective:   [name] is a 74 y.o. year old very pleasant male patient who presents with the following:  Sinus Pressure persistent CAP-improved -08/06/14 started on augmentin for bronchitis. Patient went in December 24th, did CXR showed PNA. Treated with prednisone, amoxicillin high dose, and a x-pack (augmentin stopped). 4 days later went back and placed him on levaquin and continued inhaler and given 2 breathing treatments. Finished antibiotics 5 days ago. Symptoms have almost completely resolved except for wheezing at night from lung perspective.   During this course, patient had nasal drainage and sinus pressure that was improving on levaquin. Since stopping he has had worsening sinus tenderness and Frontal headaches, remains congested, some decreased hearing left ear. Only getting some dark green globs out intermittent.   Using nasal saline spray. Nothing comes out with neti pot with no relief.   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   ROS- no chest pain, very mild shortness of breath when wheezing.  Past Medical History- Patient Active Problem List   Diagnosis Date Noted  . CAD, ARTERY BYPASS GRAFT 08/11/2009    Priority: High  . Occlusion and stenosis of carotid artery without mention of cerebral infarction 07/09/2008    Priority: High  . DVT, HX OF 04/26/2007    Priority: High  . Recurrent sinusitis 08/28/2014    Priority: Medium  . At risk for diabetes mellitus 03/20/2014    Priority: Medium  . PERIPHERAL VASCULAR DISEASE WITH CLAUDICATION 08/11/2009    Priority: Medium  . BENIGN PROSTATIC HYPERTROPHY, WITH URINARY OBSTRUCTION 09/06/2007    Priority: Medium  . HYPERTENSION 04/30/2007    Priority: Medium  . HYPERLIPIDEMIA 04/26/2007    Priority: Medium  . Chest pain, atypical 09/12/2013    Priority: Low  . Syncopal vertigo 11/04/2010    Priority: Low  . Lumbar disc disease with  radiculopathy 11/04/2010    Priority: Low  . INGUINAL HERNIA, RIGHT 05/26/2010    Priority: Low  . Edema 04/20/2010    Priority: Low  . 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 GERD 04/30/2007    Priority: Low  . OSTEOARTHRITIS 04/26/2007    Priority: Low   Medications- reviewed and updated Current Outpatient Prescriptions  Medication Sig Dispense Refill  . aspirin 81 MG tablet Take 81 mg by mouth daily.     Marland Kitchen ezetimibe-simvastatin (VYTORIN) 10-40 MG per tablet Take 1 tablet by mouth at bedtime. Monday Wednesday and Friday    . finasteride (PROSCAR) 5 MG tablet Take 5 mg by mouth daily.      . ACETAMINOPHEN-BUTALBITAL 50-325 MG TABS Take 1 tablet by mouth 2 (two) times daily as needed. (Patient not taking: Reported on 08/06/2014) 60 each 2  . albuterol (PROVENTIL HFA;VENTOLIN HFA) 108 (90 BASE) MCG/ACT inhaler Inhale 2 puffs into the lungs every 6 (six) hours as needed for wheezing or shortness of breath. (Patient not taking: Reported on 08/28/2014) 1 Inhaler 0  . NITROSTAT 0.4 MG SL tablet PLACE 1 TABLET UNDER THE TONGUE EVERY 5 MINUTES AS NEEDED. (Patient not taking: Reported on 08/06/2014) 25 tablet 1  . omeprazole (PRILOSEC) 40 MG capsule Take 1 capsule (40 mg total) by mouth daily.     No current facility-administered medications for this visit.    Objective: BP 122/64 mmHg  Pulse 82  Temp(Src) 98.1 F (36.7 C)  Wt 224 lb (101.606 kg)  SpO2 97% Gen: NAD, resting comfortably HEENT: green  drainage, TM normal, frontal sinuses mildly tender, no maxillary sinus tenderness.  CV: RRR no murmurs rubs or gallops Lungs: CTAB no crackles, wheeze, rhonchi Abdomen: soft/nontender/nondistended/normal bowel sounds.  No edema  Skin: warm, dry, no rash    Assessment/Plan:  Recurrent sinusitis Community Acquired Pneumonia Patient has a history of ENT referral for similar persistent symptoms several years ago and operation was suggested which patient declined. He  has done better over the last few years but with recent pneumonia (clear lungs and symptoms improved) he has had recurrent sinus issues. I suggested referral for ENT and patient declines. We ultimately decided upon 21 day course of augmentin and if does not resolve, send to ENT at that time. Realize patient has been on levaquin for PNA but likely equal coverage with augmentin for sinuses. High risk patient with history of CAD s/p CABG and fatigue-improving some after PNA treatment-monitor for now but if patient had recurrent shortness of breath or new chest pain would seek care.   Return precautions advised. Lives an hour away and states likely to communicate by [place] or phone.   Meds ordered this encounter  Medications  . amoxicillin-clavulanate (AUGMENTIN) 875-125 MG per tablet    Sig: Take 1 tablet by mouth 2 (two) times daily. For recurrent bacterial sinusitis    Dispense:  42 tablet    Refill:  0

## 2014-08-28 NOTE — Patient Instructions (Signed)
We are going to treat you for recurrent bacterial sinusitis with prolonged course of augmentin. Take for 3 weeks. If no improvement at 3 weeks, we can place a referral for ENT.   If worsening symptoms, fever, shortness of breath, recurrence of wheeze, please come see [place].

## 2014-10-08 ENCOUNTER — Encounter: Payer: Self-pay | Admitting: Family Medicine

## 2014-10-08 NOTE — Telephone Encounter (Signed)
See what patient says. You can order them if he desires. Medicare likely would not pay for full lipid panel unless its been a full year so I dont want him to have to pay for these.

## 2014-11-04 ENCOUNTER — Ambulatory Visit (INDEPENDENT_AMBULATORY_CARE_PROVIDER_SITE_OTHER): Payer: PPO | Admitting: Cardiovascular Disease

## 2014-11-04 ENCOUNTER — Encounter: Payer: Self-pay | Admitting: Cardiovascular Disease

## 2014-11-04 ENCOUNTER — Other Ambulatory Visit: Payer: Self-pay | Admitting: Cardiovascular Disease

## 2014-11-04 VITALS — BP 150/70 | HR 68 | Ht 73.0 in | Wt 228.0 lb

## 2014-11-04 DIAGNOSIS — R0789 Other chest pain: Secondary | ICD-10-CM

## 2014-11-04 DIAGNOSIS — E785 Hyperlipidemia, unspecified: Secondary | ICD-10-CM

## 2014-11-04 DIAGNOSIS — I25810 Atherosclerosis of coronary artery bypass graft(s) without angina pectoris: Secondary | ICD-10-CM

## 2014-11-04 DIAGNOSIS — I6523 Occlusion and stenosis of bilateral carotid arteries: Secondary | ICD-10-CM

## 2014-11-04 DIAGNOSIS — I10 Essential (primary) hypertension: Secondary | ICD-10-CM

## 2014-11-04 NOTE — Progress Notes (Signed)
Patient ID: [name], male    DOB: 03/22/1941, 74 y.o.   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 He has a AAA less than 3 cm, mild bilateral carotid arterial disease estimated at 40-50% bilaterally. Numerous previous episode of chest pain relieved with belching consistent with GI etiology. Prior stress test December 2014 showing no ischemia He has a diagnosis of obstructive sleep apnea. Could not handle CPAP despite numerous attempts. Symptoms better after weight loss total hip replacements x2  In follow-up today, he reports that he has had a difficult winter On Dec 24th he presented to the  ER with PNA per the patient on the left side He was Started on ABX, had several rounds finally on levaquin, steroids, slept in a recliner Back to the Er again,  Finally got better around mid January  Now with SOB, he feels he is deconditioned, not going to the gym No leg edema, hands like balloons when he wakes up in the morning Denies having any chest pain on exertion, just weak legs  EKG on today's visit shows normal sinus rhythm with rate 68 bpm, no significant ST or T-wave changes  Other past medical history  went to the emergency room in [facility] 07/03/2013 with chest pain. Had a CT scan of his chest which was essentially benign. Nitroglycerin did not help his pain. He ruled out and was sent home.  Repeat episode of chest pain 08/05/2013 with stress test at that time showing no significant ischemia, ejection fraction 48%. It was felt his chest pain was atypical in nature. She feels it was from GI etiology given his negative stress test.  He does report having some cold induced chest tightness/shortness of breath, asthma.    episode of chest pain 05/14/2013.  He felt was indigestion. He took several nitroglycerin with no significant improvement in his symptoms. Symptoms were severe, felt similar to his previous MI. He got in his car and on the way, had a big burp. Symptoms seemed to resolve at that time. He continued on to the emergency room. There cardiac enzymes were negative x2, EKG unchanged. He reports having had prior chest pain episode relieved with belching in the past.   Allergies  Allergen Reactions  . Morphine   . Rofecoxib     Outpatient Encounter Prescriptions as of 11/04/2014  Medication Sig  . ACETAMINOPHEN-BUTALBITAL 50-325 MG TABS Take 1 tablet by mouth 2 (two) times daily as needed.  Marland Kitchen albuterol (PROVENTIL HFA;VENTOLIN HFA) 108 (90 BASE) MCG/ACT inhaler Inhale 2 puffs into the lungs every 6 (six) hours as needed for wheezing or shortness of breath.  Marland Kitchen aspirin 81 MG tablet Take 81 mg by mouth daily.   Marland Kitchen ezetimibe-simvastatin (VYTORIN) 10-40 MG per tablet Take 1 tablet by mouth at bedtime. Monday Wednesday and Friday  . finasteride (PROSCAR) 5 MG tablet Take 5 mg by mouth daily.    Marland Kitchen NITROSTAT 0.4 MG SL tablet PLACE 1 TABLET UNDER THE TONGUE EVERY 5 MINUTES AS NEEDED.  Marland Kitchen omeprazole (PRILOSEC) 40 MG capsule Take 1 capsule (40 mg total) by mouth daily.  . [DISCONTINUED] amoxicillin-clavulanate (AUGMENTIN) 875-125 MG per tablet Take 1 tablet by mouth 2 (two) times daily. For recurrent bacterial sinusitis (Patient not taking: Reported on 11/04/2014)    Past Medical History  Diagnosis Date  . HYPERLIPIDEMIA 04/26/2007  . HYPERTENSION 04/30/2007  . 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 GERD 04/30/2007  . INGUINAL HERNIA, RIGHT 05/26/2010  . BENIGN PROSTATIC HYPERTROPHY, WITH URINARY OBSTRUCTION 09/06/2007  . 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 LOC OSTEOARTHROS NOT SPEC PRIM/SEC LOWER LEG 11/05/2009  . OSTEOARTHRITIS 04/26/2007  .  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 Edema 04/20/2010  . PSA, INCREASED 07/09/2008  . DVT, HX OF 04/26/2007  . Lumbar disc disease with radiculopathy   . Spinal stenosis of lumbar region   . History of shingles     Past Surgical History  Procedure Laterality Date  . Appendectomy    . Coronary artery bypass graft      3vessel  in 1990  . Cataract extraction, bilateral    . Joint replacement      both hips in 2002  . Spine surgery  1984    two ruptured discs/fusion  . Knee arthroscopy      x 2  . Cystoscopy  12/23/10    Social History  reports that he quit smoking about 26 years ago. His smoking use included Cigarettes. He has a 25 pack-year smoking history. He has never used smokeless tobacco. He reports that he does not drink alcohol or use illicit drugs.  Family History family history includes Cancer in his sister; Diabetes in his brother and mother; Heart attack in his mother; Heart disease in his father; Stroke in his father and mother.  Review of Systems  Constitutional: Negative.   Respiratory: Negative.   Cardiovascular: Negative.   Gastrointestinal: Negative.   Musculoskeletal: Positive for arthralgias.  Skin: Negative.   Neurological: Negative.   Hematological: Negative.   Psychiatric/Behavioral: Negative.   All other systems reviewed and are negative.   BP 150/70 mmHg  Pulse 68  Ht 6\' 1"  (1.854 m)  Wt 228 lb (103.42 kg)  BMI 30.09 kg/m2  Physical Exam  Constitutional: He is oriented to person, place, and time. He appears well-developed and well-nourished.  HENT:  Head: Normocephalic.  Nose: Nose normal.  Mouth/Throat: Oropharynx is clear and moist.  Eyes: Conjunctivae are normal. Pupils are equal, round, and reactive to light.  Neck: Normal range of motion. Neck supple. No JVD present.  Cardiovascular: Normal rate, regular rhythm, S1 normal, S2 normal, normal heart sounds and intact distal pulses.  Exam reveals no gallop and no friction rub.   No  murmur heard. Pulmonary/Chest: Effort normal and breath sounds normal. No respiratory distress. He has no wheezes. He has no rales. He exhibits no tenderness.  Abdominal: Soft. Bowel sounds are normal. He exhibits no distension. There is no tenderness.  Musculoskeletal: Normal range of motion. He exhibits no edema or tenderness.  Lymphadenopathy:    He has no cervical adenopathy.  Neurological: He is alert and oriented to person, place, and time. Coordination normal.  Skin: Skin is warm and dry. No rash noted. No erythema.  Psychiatric: He has a normal mood and affect. His behavior is normal. Judgment and thought content normal.      Assessment and Plan   Nursing note and vitals reviewed.

## 2014-11-04 NOTE — Assessment & Plan Note (Signed)
Cholesterol is at goal on the current lipid regimen. No changes to the medications were made.  

## 2014-11-04 NOTE — Assessment & Plan Note (Signed)
No recent chest pain symptoms concerning for angina. No further testing ordered

## 2014-11-04 NOTE — Patient Instructions (Addendum)
You are doing well. No medication changes were made.  We will check your cholesterol and liver numbers today: Please take orders to the [employer] entrance of [facility]  Please call us if you have new issues that need to be addressed before your next appt.  Your physician wants you to follow-up in: 6 months.  You will receive a reminder letter in the mail two months in advance. If you don't receive a letter, please call our office to schedule the follow-up appointment.

## 2014-11-04 NOTE — Assessment & Plan Note (Signed)
Minimal disease on the right 40-59% atherosclerotic stenosis on the left, stable from before Last evaluated November 2015

## 2014-11-04 NOTE — Assessment & Plan Note (Signed)
Blood pressure is well controlled on today's visit. No changes made to the medications. 

## 2014-11-04 NOTE — Assessment & Plan Note (Signed)
Currently with no symptoms of angina. No further workup at this time. Continue current medication regimen. 

## 2014-11-05 ENCOUNTER — Other Ambulatory Visit: Payer: Self-pay

## 2014-11-05 DIAGNOSIS — I25810 Atherosclerosis of coronary artery bypass graft(s) without angina pectoris: Secondary | ICD-10-CM

## 2014-12-05 NOTE — Consult Note (Signed)
PATIENT NAME:  MAZIN, EMMA MR#:  [id number] DATE OF BIRTH:  26-Feb-1941  DATE OF CONSULTATION:  07/03/2013  CONSULTING PHYSICIAN:  Epifanio Lesches, MD  PRIMARY CARE PHYSICIAN: Benay Pillow, MD, from [facility] in [place].   CHIEF COMPLAINT: Chest pain, hand pains.   HISTORY OF PRESENT ILLNESS: The patient is a 74 year old male with a history of hypertension, coronary artery disease status post bypass and stent, who came in because of generalized body pains mainly in the chest, back, and both arms were hurting. The patient says that he has been having this pain for 2 days, gotten worse today. 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 He says that nitroglycerin really did not do much, but Percocet helped him. Pain came down to 2 and he really wants to go home. Did not have any sweating. No nausea. No vomiting. No shortness of breath or chest pain. The patient denies any diaphoresis.   Initial set of troponins are negative. EKG unremarkable. Other labs are normal. The patient will be getting Percocet for chest pain, which is musculoskeletal in origin and he will follow up with Dr. Esmond Plants tomorrow. The patient is in agreement with this plan and the wife also agreed with this.   PAST MEDICAL HISTORY: Significant for hypertension and also hyperlipidemia and BPH. Also significant for DVT.  ALLERGIES: MORPHINE, BUT HE COULD TAKE PERCOCET HERE.   PAST SURGICAL HISTORY: Significant for bypass surgery in 1990. Also he had a  cardiac stent in 2002 ,  and the patient also has a history of both hips replaced, appendectomy, and also knee surgery.   SOCIAL HISTORY: Previous smoker, quit. No alcohol. No drugs.   FAMILY HISTORY: Significant for hypertension and diabetes two brothers.  MEDICATIONS: Aspirin 81 mg daily, finasteride 5 mg p.o. daily, nitroglycerin 0.4 mg every 5 minutes as needed  for chest pain, Prilosec 40 mg p.o. daily, Vytorin 10/40 mg one tablet p.o. at bedtime on Monday, Wednesday, and Friday, Percocet 5/325. That is given today.   REVIEW OF SYSTEMS: CONSTITUTIONAL: No fever. No fatigue. No weakness.  EYES: No blurred vision. No inflammation.  ENT: No tinnitus. No ear pain. No epistaxis. No difficulty swallowing.  RESPIRATORY: No cough. No wheezing. No painful respiration.  CARDIOVASCULAR: Has chest pain which is painful to palpation, and no orthopnea. No PND. No pedal edema.  GASTROINTESTINAL: No nausea. No vomiting. No abdominal pain. No diaphoresis.  GENITOURINARY: No dysuria.  ENDOCRINE: No polyuria or nocturia.  INTEGUMENTARY: No skin rashes.  MUSCULOSKELETAL: No joint pains.  NEUROLOGIC: No numbness or weakness.  PSYCHIATRIC: No anxiety or insomnia.   PHYSICAL EXAMINATION:  VITAL SIGNS: Temperature 97.6, heart rate 83. Blood pressure is 138/81, and saturation 96% on room air.  GENERAL: Alert, awake, oriented, not in distress. Answers questions appropriately.  HEAD: Normocephalic, atraumatic. Pupils equally reacting to light. Extraocular movements are intact.  NOSE: No turbinate hypertrophy. No drainage. He has no tympanic membrane congestion.  MOUTH: No lesions.  NECK: Supple. No JVD. No carotid bruit. Normal range of motion.  RESPIRATORY: Clear to auscultation. No wheeze. No rales. Normal respiratory effort.  CARDIOVASCULAR: S1, S2, regular. No murmurs. The patient does have some chest wall tenderness and the patient has good femoral and pedal pulses. No peripheral edema.  ABDOMEN: Soft, nontender, nondistended. Bowel sounds present. No organomegaly. No hernias.  EXTREMITIES: Able to move all extremities. Normal  tone and strength bilaterally.  LYMPH NODES: No cervical lymphadenopathy or axillary lymphadenopathy.  SKIN: Normal and also well hydrated. No diaphoresis.  NEUROLOGIC: Cranial nerves II through XII intact. Power is 4/5 in upper and lower  extremities. Sensation intact. The patient able to move bilaterally.  PSYCHIATRIC: Motor and affect are within normal limits.   LABORATORIES: CT angiogram of the chest show no CT evidence of PE and also has no evidence of aneurysm or dissection.   Troponin less than 0.02.   Chest x-ray significant for no active disease.   WBC 5.6, hemoglobin 14.9, hematocrit 41.8, platelets 198. Electrolytes: Sodium 140, potassium 3.9, chloride 108. Bicarbonate is 28.bun 20,, creatinine 1.16, glucose 116. D-dimer 0.52.   EKG shows normal sinus rhythm with no ST-T changes. The patient's rate is 76 beats per minute.   ASSESSMENT AND PLAN:  1.  This patient is a 74 year old male patient with chest pain symptoms consistent with musculoskeletal pain with negative troponins, normal EKG, pain is treated with Percocet. We are going to discharge him from the ER. The patient will continue his home medications. I added Percocet for his musculoskeletal pain. The patient will call Dr. Esmond Plants tomorrow or the day after if he continues to have chest pain; otherwise, he can follow up with him as needed.  2.  History of coronary artery disease. Continue his aspirin and Vytorin. He is not on any beta blockers.  3.  The patient has benign prostatic hypertrophy. Continue finasteride.   I advised him to follow up with Dr. Esmond Plants and probably he needs to go back on a small dose of a beta blocker for his  history of coronary artery disease.   Time Spent on consultation: About 60 minutes.   Discharge the patient from the Emergency Room.    ____________________________ Epifanio Lesches, MD sk:np D: 07/03/2013 18:01:45 ET T: 07/03/2013 19:32:33 ET JOB#: [id number]  cc: Epifanio Lesches, MD, <Dictator> Epifanio Lesches MD ELECTRONICALLY SIGNED 07/15/2013 15:16

## 2015-01-15 ENCOUNTER — Encounter: Payer: Self-pay | Admitting: Family Medicine

## 2015-01-15 ENCOUNTER — Ambulatory Visit (INDEPENDENT_AMBULATORY_CARE_PROVIDER_SITE_OTHER): Payer: PPO | Admitting: Family Medicine

## 2015-01-15 VITALS — BP 144/88 | HR 72 | Temp 98.3°F | Wt 226.0 lb

## 2015-01-15 DIAGNOSIS — A499 Bacterial infection, unspecified: Secondary | ICD-10-CM | POA: Diagnosis not present

## 2015-01-15 DIAGNOSIS — J329 Chronic sinusitis, unspecified: Secondary | ICD-10-CM | POA: Diagnosis not present

## 2015-01-15 DIAGNOSIS — T148 Other injury of unspecified body region: Secondary | ICD-10-CM | POA: Diagnosis not present

## 2015-01-15 DIAGNOSIS — J029 Acute pharyngitis, unspecified: Secondary | ICD-10-CM

## 2015-01-15 DIAGNOSIS — B9689 Other specified bacterial agents as the cause of diseases classified elsewhere: Secondary | ICD-10-CM

## 2015-01-15 DIAGNOSIS — W57XXXA Bitten or stung by nonvenomous insect and other nonvenomous arthropods, initial encounter: Secondary | ICD-10-CM

## 2015-01-15 LAB — POCT RAPID STREP A (OFFICE): Rapid Strep A Screen: NEGATIVE

## 2015-01-15 MED ORDER — DOXYCYCLINE HYCLATE 100 MG PO TABS: 100.0000 mg | ORAL_TABLET | Freq: Two times a day (BID) | ORAL | Status: DC

## 2015-01-15 NOTE — Progress Notes (Signed)
Garret Reddish, MD  Subjective:  [name] is a 74 y.o. year old very pleasant male patient who presents with:  Sinus pressure/Sore throat/headache/malaise/headache Sinus congestion started about 2 weeks ago including sinus pressure.  Tylenol helps some with headaches and pressure. Started yesterday with sore throat, headache. no fevers. Couldn't sleep last night so severe. Left sided ear pain. Diffuse frontal headache. Has been working outside a lot and has had several tick bites of varying ages over last few weeks. Sinus symptoms worsened.  Denies sick contacts.   ROS- no rash, no fever  Past Medical History- CAD, carotid stenosis, hx DVT, HDL, HTN, recurrent sinusitis in past requring 21 days levaquin  Medications- reviewed and updated Current Outpatient Prescriptions  Medication Sig Dispense Refill  . aspirin 81 MG tablet Take 81 mg by mouth daily.     Marland Kitchen ezetimibe-simvastatin (VYTORIN) 10-40 MG per tablet Take 1 tablet by mouth at bedtime. Monday Wednesday and Friday    . finasteride (PROSCAR) 5 MG tablet Take 5 mg by mouth daily.      . ACETAMINOPHEN-BUTALBITAL 50-325 MG TABS Take 1 tablet by mouth 2 (two) times daily as needed. (Patient not taking: Reported on 01/15/2015) 60 each 2  . albuterol (PROVENTIL HFA;VENTOLIN HFA) 108 (90 BASE) MCG/ACT inhaler Inhale 2 puffs into the lungs every 6 (six) hours as needed for wheezing or shortness of breath. (Patient not taking: Reported on 01/15/2015) 1 Inhaler 0  . NITROSTAT 0.4 MG SL tablet PLACE 1 TABLET UNDER THE TONGUE EVERY 5 MINUTES AS NEEDED. (Patient not taking: Reported on 01/15/2015) 25 tablet 1  . omeprazole (PRILOSEC) 40 MG capsule Take 1 capsule (40 mg total) by mouth daily.     No current facility-administered medications for this visit.    Objective: BP 144/88 mmHg  Pulse 72  Temp(Src) 98.3 F (36.8 C)  Wt 226 lb (102.513 kg) Gen: NAD, resting comfortably R frontal tenderness, TM normal, pharynx erythematous with mild  tonsilar enlargement but no exudate, MMM CV: RRR no murmurs rubs or gallops Lungs: CTAB no crackles, wheeze, rhonchi Abdomen: soft/nontender/nondistended/normal bowel sounds. No rebound or guarding.  Ext: no edema Skin: warm, dry, no rash on palms/soles, no rash including no target lesion Neuro: grossly normal, moves all extremities, slow gait due to some back pain being managed by Dr. Nelva Bush   Assessment/Plan:  Sinus pressure/Sore throat/headache/malaise/headache Tick Bite 3 issues potentially 1. Bacterial sinusitis (symptoms >2 weeks including sinus pressure/drainage) 2. Sore throat severe- does not have appearance of strep through is inflammed, potential viral pharyngitis and rapid strep negative (did not send for culture with low Centor) 3. Tick Bite in patient with headache, malaise though likely secondary to above. Doubt RMSF.  -treat with doxycyline for sinusitis which also covers tick exposure  Noted patient may be moving to [facility] or [place] locations which are much closer to home Results for orders placed or performed in visit on 01/15/15 (from the past 24 hour(s))  POC Rapid Strep A     Status: None   Collection Time: 01/15/15 10:43 AM  Result Value Ref Range   Rapid Strep A Screen Negative Negative   Meds ordered this encounter  Medications  . doxycycline (VIBRA-TABS) 100 MG tablet    Sig: Take 1 tablet (100 mg total) by mouth 2 (two) times daily.    Dispense:  20 tablet    Refill:  0

## 2015-01-15 NOTE — Patient Instructions (Signed)
Bacterial sinusitis with viral sore throat on top of that  Treat sinusitis with doxycycline for 10 days  Will also cover your tick exposure  Follow up if new or worsening symptoms that we discussed

## 2015-02-09 ENCOUNTER — Encounter: Payer: Self-pay | Admitting: Family Medicine

## 2015-02-09 ENCOUNTER — Ambulatory Visit (INDEPENDENT_AMBULATORY_CARE_PROVIDER_SITE_OTHER): Payer: PPO | Admitting: Family Medicine

## 2015-02-09 ENCOUNTER — Other Ambulatory Visit: Payer: Self-pay | Admitting: Family Medicine

## 2015-02-09 ENCOUNTER — Other Ambulatory Visit: Payer: Self-pay

## 2015-02-09 VITALS — BP 140/78 | HR 99 | Temp 98.3°F | Wt 226.0 lb

## 2015-02-09 DIAGNOSIS — R21 Rash and other nonspecific skin eruption: Secondary | ICD-10-CM | POA: Diagnosis not present

## 2015-02-09 DIAGNOSIS — T148 Other injury of unspecified body region: Secondary | ICD-10-CM | POA: Diagnosis not present

## 2015-02-09 DIAGNOSIS — W57XXXA Bitten or stung by nonvenomous insect and other nonvenomous arthropods, initial encounter: Secondary | ICD-10-CM

## 2015-02-09 DIAGNOSIS — Z23 Encounter for immunization: Secondary | ICD-10-CM

## 2015-02-09 DIAGNOSIS — L259 Unspecified contact dermatitis, unspecified cause: Secondary | ICD-10-CM | POA: Diagnosis not present

## 2015-02-09 MED ORDER — TRIAMCINOLONE ACETONIDE 0.1 % EX CREA: 1.0000 "application " | TOPICAL_CREAM | Freq: Two times a day (BID) | CUTANEOUS | Status: DC

## 2015-02-09 NOTE — Progress Notes (Signed)
Garret Reddish, MD  Subjective:  [name] is a 74 y.o. year old very pleasant male patient who presents with:  Rash Tick Bite new urological med within a week. Tick bite 2 days ago Left arm. Area has firmness underneath with some bruising below that. No target lesion. Not worsening- slowly improving.  Inner leg last night another tick bite- no issues and no mark. In the woods a lot and wonders if he could have gotten some poison oak on his abdomen (not clear how this would have happened. Has had this rash on abdomen for 2 days. 5 red papules not expanding. Tick bite on arm and papules on abdomen intensely pruritic. Hydrocortisone helps.   ROS-not ill appearing, no fever/chills. No new medications. Not immunocompromised. No mucus membrane involvement. No flu like symptoms  Past Medical History- CAD, carotid stenosis, history of DVT< BPH, HTN, HLD  Medications- reviewed and updated Current Outpatient Prescriptions  Medication Sig Dispense Refill  . aspirin 81 MG tablet Take 81 mg by mouth daily.     Marland Kitchen ezetimibe-simvastatin (VYTORIN) 10-40 MG per tablet Take 1 tablet by mouth at bedtime. Monday Wednesday and Friday    . finasteride (PROSCAR) 5 MG tablet Take 5 mg by mouth daily.      . ACETAMINOPHEN-BUTALBITAL 50-325 MG TABS Take 1 tablet by mouth 2 (two) times daily as needed. (Patient not taking: Reported on 01/15/2015) 60 each 2  . albuterol (PROVENTIL HFA;VENTOLIN HFA) 108 (90 BASE) MCG/ACT inhaler Inhale 2 puffs into the lungs every 6 (six) hours as needed for wheezing or shortness of breath. (Patient not taking: Reported on 01/15/2015) 1 Inhaler 0  . NITROSTAT 0.4 MG SL tablet PLACE 1 TABLET UNDER THE TONGUE EVERY 5 MINUTES AS NEEDED. (Patient not taking: Reported on 02/09/2015) 25 tablet 1  . omeprazole (PRILOSEC) 40 MG capsule Take 1 capsule (40 mg total) by mouth daily.     Objective: BP 140/78 mmHg  Pulse 99  Temp(Src) 98.3 F (36.8 C)  Wt 226 lb (102.513 kg) Gen: NAD,  resting comfortably, well appearing CV: RRR Lungs: CTAB no crackles, wheeze, rhonchi Abdomen: soft/nontender/nondistended/normal bowel sounds. No rebound or guarding.  Ext: no edema Skin: warm, dry, left inner upper arm with small red papule with some firmness underneath, mild bruising noted unerneath. On abdomen has 5 erythematous papules in linear distribution Neuro: grossly normal, moves all extremities   Assessment/Plan:  Rash-possible contact derm on abdomen, treat with triamcinolone. Would be atypical location but rash is linear and could potentially be this. No red flags Tick Bite - local inflammation on right arm-advised icing area. Triamcinolone for itch portion. No systemic signs but gave return precautions for RMSF and lyme.   Orders Placed This Encounter  Procedures  . Pneumococcal conjugate vaccine 13-valent   Meds ordered this encounter  Medications  . triamcinolone cream (KENALOG) 0.1 %    Sig: Apply 1 application topically 2 (two) times daily.    Dispense:  30 g    Refill:  0

## 2015-02-09 NOTE — Telephone Encounter (Signed)
You can use hydrocortisone for the itch over the counter. i would be happy to see you to evaluate further especially given new rash.   Bevelyn Ngo- you can work him in sometime today or end of the day  ===View-only below this line===   ----- Message -----    From: [place]: 02/09/2015 12:10 AM EDT      To: Garret Reddish, MD Subject: Visit Follow-Up Question  I removed another tick from my left upper arm on Saturday, 02/07/15 at 2am...  The spot is red, there is a knot and it is itching like crazy - has itched ever since I removed the tick.  I also have several bites/rash on my stomach that is itching also.  What do you suggest I do for each of these areas?

## 2015-02-09 NOTE — Patient Instructions (Addendum)
Received final pneumonia shot today ([id number]).  Tick Bite  If you develop flu like symptoms- need to return to see [place] immediately (fever, chills, body aches) also if had rash on palms and soles  Ice the spot where you were bit- there is some inflammation  For itch you can use triamcinolone  Rash on abdomen  Possible contact dermatitis  Treat with triamcinolone twice a day for 10 days  If does not respond or if symptoms worsen, return to care

## 2015-02-11 ENCOUNTER — Other Ambulatory Visit: Payer: Self-pay | Admitting: Cardiovascular Disease

## 2015-02-11 DIAGNOSIS — I714 Abdominal aortic aneurysm, without rupture, unspecified: Secondary | ICD-10-CM

## 2015-02-13 ENCOUNTER — Ambulatory Visit (INDEPENDENT_AMBULATORY_CARE_PROVIDER_SITE_OTHER): Payer: PPO

## 2015-02-13 DIAGNOSIS — I714 Abdominal aortic aneurysm, without rupture, unspecified: Secondary | ICD-10-CM

## 2015-03-31 ENCOUNTER — Encounter: Payer: Self-pay | Admitting: Family Medicine

## 2015-03-31 ENCOUNTER — Ambulatory Visit (INDEPENDENT_AMBULATORY_CARE_PROVIDER_SITE_OTHER): Payer: PPO | Admitting: Family Medicine

## 2015-03-31 VITALS — BP 140/70 | HR 73 | Temp 98.0°F | Ht 73.0 in | Wt 227.0 lb

## 2015-03-31 DIAGNOSIS — I25810 Atherosclerosis of coronary artery bypass graft(s) without angina pectoris: Secondary | ICD-10-CM

## 2015-03-31 DIAGNOSIS — N401 Enlarged prostate with lower urinary tract symptoms: Secondary | ICD-10-CM

## 2015-03-31 DIAGNOSIS — E785 Hyperlipidemia, unspecified: Secondary | ICD-10-CM

## 2015-03-31 DIAGNOSIS — K219 Gastro-esophageal reflux disease without esophagitis: Secondary | ICD-10-CM

## 2015-03-31 DIAGNOSIS — I10 Essential (primary) hypertension: Secondary | ICD-10-CM

## 2015-03-31 DIAGNOSIS — M1990 Unspecified osteoarthritis, unspecified site: Secondary | ICD-10-CM

## 2015-03-31 DIAGNOSIS — N138 Other obstructive and reflux uropathy: Secondary | ICD-10-CM

## 2015-03-31 DIAGNOSIS — I6523 Occlusion and stenosis of bilateral carotid arteries: Secondary | ICD-10-CM

## 2015-03-31 NOTE — Progress Notes (Signed)
BP 140/70 mmHg  Pulse 73  Temp(Src) 98 F (36.7 C) (Tympanic)  Ht 6\' 1"  (1.854 m)  Wt 227 lb (102.967 kg)  BMI 29.96 kg/m2  SpO2 96%   CC: transfer of care from Dr Yong Channel at [facility]  Subjective:    Patient ID: [name], male    DOB: 1941-02-14, 74 y.o.   MRN: [id number]  HPI: [name] is a 74 y.o. male presenting on 03/31/2015 for transfer care   BPH - on proscar 5mg  daily. Sees Dr Karsten Ro.   HLD - on vytorin 10/40mg  once daily. Denies myalgias. Has been using samples for last several years.   HTN - listed in chart. Off meds  CAD s/p 3v CABG 1990 - sees Dr Rockey Situ.  Carotid stenosis - 40-59% stenosis on left side, stable from riht side. Due for rpt 06/2015  Herniated disc s/p spine injections, had a fall onto tailbone earlier in the year - sees [facility].   H/o DVT s/p treatment. Was [employer] stage [employer].   Preventative: Last AMW unsure  Relevant past medical, surgical, family and social history reviewed and updated as indicated. Interim medical history since our last visit reviewed. Allergies and medications reviewed and updated. Current Outpatient Prescriptions on File Prior to Visit  Medication Sig  . aspirin 81 MG tablet Take 81 mg by mouth daily.   Marland Kitchen ezetimibe-simvastatin (VYTORIN) 10-40 MG per tablet Take 1 tablet by mouth at bedtime. Monday Wednesday and Friday  . finasteride (PROSCAR) 5 MG tablet Take 5 mg by mouth daily.    Marland Kitchen triamcinolone cream (KENALOG) 0.1 % Apply 1 application topically 2 (two) times daily.   No current facility-administered medications on file prior to visit.    Review of Systems Per HPI unless specifically indicated above     Objective:    BP 140/70 mmHg  Pulse 73  Temp(Src) 98 F (36.7 C) (Tympanic)  Ht 6\' 1"  (1.854 m)  Wt 227 lb (102.967 kg)  BMI 29.96 kg/m2  SpO2 96%  Wt Readings from Last 3 Encounters:  03/31/15 227 lb (102.967 kg)  02/09/15 226 lb (102.513 kg)  01/15/15 226 lb (102.513  kg)    Physical Exam  Constitutional: He appears well-developed and well-nourished. No distress.  HENT:  Mouth/Throat: Oropharynx is clear and moist. No oropharyngeal exudate.  Eyes: Conjunctivae and EOM are normal. Pupils are equal, round, and reactive to light.  Neck: Normal range of motion. Neck supple. Carotid bruit is not present (did not appreciate left sided bruit today).  Cardiovascular: Normal rate, regular rhythm, normal heart sounds and intact distal pulses.   No murmur heard. Pulmonary/Chest: Effort normal and breath sounds normal. No respiratory distress. He has no wheezes. He has no rales.  Abdominal:  ventral hernia present.  Skin: Skin is warm and dry. No rash noted.  Psychiatric: He has a normal mood and affect.  Nursing note and vitals reviewed.      Assessment & Plan:   Problem List Items Addressed This Visit    Hyperlipidemia - Primary    Chronic, stable. Continue vytorin nightly.  Every other week takes a break.      Essential hypertension    H/o white coat hypertension. Continue to monitor off bp meds.      CAD, ARTERY BYPASS GRAFT    Followed by Dr Rockey Situ.      Carotid stenosis    Next carotid US will be due 06/2015.      GERD    Continue  omeprazole 40mg  daily      Relevant Medications   omeprazole (PRILOSEC) 40 MG capsule   BPH (benign prostatic hypertrophy) with urinary obstruction    Continue proscar, f/u with [facility].      Osteoarthritis    Continue f/u with ortho          Follow up plan: Return in about 3 months (around 07/01/2015), or as needed, for medicare wellness visit.

## 2015-03-31 NOTE — Assessment & Plan Note (Signed)
Continue proscar, f/u with [facility].

## 2015-03-31 NOTE — Assessment & Plan Note (Signed)
Continue omeprazole 40 mg daily

## 2015-03-31 NOTE — Assessment & Plan Note (Signed)
Next carotid US will be due 06/2015.

## 2015-03-31 NOTE — Assessment & Plan Note (Addendum)
Chronic, stable. Continue vytorin nightly.  Every other week takes a break.

## 2015-03-31 NOTE — Assessment & Plan Note (Signed)
H/o white coat hypertension. Continue to monitor off bp meds.

## 2015-03-31 NOTE — Progress Notes (Signed)
Pre visit review using our clinic review tool, if applicable. No additional management support is needed unless otherwise documented below in the visit note. 

## 2015-03-31 NOTE — Assessment & Plan Note (Signed)
Followed by Dr Gollan. 

## 2015-03-31 NOTE — Patient Instructions (Addendum)
Nice to meet you today. You are doing well. No changes to medicines today. Return as needed or in 2-3 months for medicare wellness visit.

## 2015-03-31 NOTE — Assessment & Plan Note (Signed)
Continue f/u with ortho.  

## 2015-05-04 ENCOUNTER — Ambulatory Visit (INDEPENDENT_AMBULATORY_CARE_PROVIDER_SITE_OTHER): Payer: PPO | Admitting: Cardiovascular Disease

## 2015-05-04 ENCOUNTER — Encounter: Payer: Self-pay | Admitting: Cardiovascular Disease

## 2015-05-04 VITALS — BP 160/80 | Ht 73.0 in | Wt 229.0 lb

## 2015-05-04 DIAGNOSIS — I10 Essential (primary) hypertension: Secondary | ICD-10-CM

## 2015-05-04 DIAGNOSIS — Z720 Tobacco use: Secondary | ICD-10-CM

## 2015-05-04 DIAGNOSIS — R7303 Prediabetes: Secondary | ICD-10-CM | POA: Insufficient documentation

## 2015-05-04 DIAGNOSIS — E785 Hyperlipidemia, unspecified: Secondary | ICD-10-CM

## 2015-05-04 DIAGNOSIS — I6523 Occlusion and stenosis of bilateral carotid arteries: Secondary | ICD-10-CM

## 2015-05-04 DIAGNOSIS — R0602 Shortness of breath: Secondary | ICD-10-CM

## 2015-05-04 DIAGNOSIS — I209 Angina pectoris, unspecified: Secondary | ICD-10-CM

## 2015-05-04 DIAGNOSIS — E1169 Type 2 diabetes mellitus with other specified complication: Secondary | ICD-10-CM | POA: Insufficient documentation

## 2015-05-04 DIAGNOSIS — R079 Chest pain, unspecified: Secondary | ICD-10-CM | POA: Insufficient documentation

## 2015-05-04 DIAGNOSIS — R0781 Pleurodynia: Secondary | ICD-10-CM | POA: Insufficient documentation

## 2015-05-04 DIAGNOSIS — I25810 Atherosclerosis of coronary artery bypass graft(s) without angina pectoris: Secondary | ICD-10-CM

## 2015-05-04 DIAGNOSIS — R7309 Other abnormal glucose: Secondary | ICD-10-CM

## 2015-05-04 DIAGNOSIS — Z87891 Personal history of nicotine dependence: Secondary | ICD-10-CM

## 2015-05-04 NOTE — Patient Instructions (Addendum)
You are doing well. No medication changes were made.  We will look for lab work when it arrives  Monitor blood pressure Goal is <140 on the top,  <90 on the bottom  Low carb diet, drop weight  Please call us if you have new issues that need to be addressed before your next appt.  Your physician wants you to follow-up in: 6 months.  You will receive a reminder letter in the mail two months in advance. If you don't receive a letter, please call our office to schedule the follow-up appointment.

## 2015-05-04 NOTE — Assessment & Plan Note (Signed)
Blood pressure is elevated today. Uncertain if this is situational Recommended he check his blood pressure at home on a daily basis and call our office with numbers He is concerned it could be his new urologic medication

## 2015-05-04 NOTE — Progress Notes (Signed)
Patient ID: [name], male    DOB: 11/17/1940, 74 y.o.   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 He has a AAA less than 3 cm, mild bilateral carotid arterial disease estimated at 40-50% bilaterally. Numerous previous episode of chest pain relieved with belching consistent with GI etiology. Prior stress test December 2014 showing no ischemia He has a diagnosis of obstructive sleep apnea. Could not handle CPAP despite numerous attempts. Symptoms better after weight loss total hip replacements x2 He presents today for follow-up of his coronary artery disease, PAD  He reports having significant back, hip, knee problems. Significant Knee swelling after recently working at [employer]. Feels he spent too much time on his feet. He is requesting a disability placard for his car   Denies any shortness of breath with exertion or chest pain concerning for angina Continues to take Vytorin as tolerated. Previously total cholesterol was above goal taking one half dose every other day, recommended to take more per week. Blood pressure elevated today. He blames his new urologic medication.  Was not checking his blood pressure before. Last to Dr. visit systolic pressure was 024, today 160 even on repeat by myself  EKG today shows normal sinus rhythm with rate 76 bpm, no significant ST or T-wave changes, left axis deviation  Other past medical history  went to the emergency room in [facility] 07/03/2013 with chest pain. Had a CT scan of his chest which was essentially benign. Nitroglycerin did not help his pain. He ruled out and was sent home.  Repeat episode of chest pain 08/05/2013 with stress test at that time showing no significant ischemia, ejection fraction 48%. It was  felt his chest pain was atypical in nature. She feels it was from GI etiology given his negative stress test.  He does report having some cold induced chest tightness/shortness of breath, asthma.    episode of chest pain 05/14/2013. He felt was indigestion. He took several nitroglycerin with no significant improvement in his symptoms. Symptoms were severe, felt similar to his previous MI. He got in his car and on the way, had a big burp. Symptoms seemed to resolve at that time. He continued on to the emergency room. There cardiac enzymes were negative x2, EKG unchanged. He reports having had prior chest pain episode relieved with belching in the past.   Allergies  Allergen Reactions  . Morphine   . Rofecoxib     Outpatient Encounter Prescriptions as of 05/04/2015  Medication Sig  . aspirin 81 MG tablet Take 81 mg by mouth daily.   Marland Kitchen ezetimibe-simvastatin (VYTORIN) 10-40 MG per tablet Take 1 tablet by mouth at bedtime. Monday Wednesday and Friday  . finasteride (PROSCAR) 5 MG tablet Take 5 mg by mouth daily.    . mirabegron ER (MYRBETRIQ) 50 MG TB24 tablet Take 50 mg by mouth daily.  Marland Kitchen omeprazole (PRILOSEC) 40 MG capsule Take 40 mg by mouth daily.  . Testosterone (ANDROGEL) 20.25 MG/1.25GM (1.62%) GEL Place onto the skin every morning.  . triamcinolone cream (KENALOG) 0.1 % Apply 1 application topically 2 (two) times daily.   No facility-administered encounter medications on file as of 05/04/2015.    Past Medical History  Diagnosis Date  . HYPERLIPIDEMIA 04/26/2007  . HYPERTENSION 04/30/2007  . CAD, ARTERY BYPASS  GRAFT 08/11/2009  . 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 GERD 04/30/2007  . INGUINAL HERNIA, RIGHT 05/26/2010  . BENIGN PROSTATIC HYPERTROPHY, WITH URINARY OBSTRUCTION 09/06/2007  . 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 LOC OSTEOARTHROS NOT SPEC PRIM/SEC LOWER LEG 11/05/2009  . OSTEOARTHRITIS 04/26/2007  . 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 PSA, INCREASED 07/09/2008  . DVT, HX OF 1998  . Lumbar disc disease with radiculopathy   . Spinal stenosis of lumbar region   . History of shingles   . Pneumonia 07/2014    [facility] hospitalization    Past Surgical History  Procedure Laterality Date  . Appendectomy      rupture  . Coronary artery bypass graft  1990    3 vessel   . Cataract extraction, bilateral    . Total hip arthroplasty Bilateral 1999,2000  . Spine surgery  1984    lumbar for two ruptured discs/fusion  . Knee arthroscopy      x2  . Cystoscopy  12/23/10    [facility]    Social History  reports that he quit smoking about 26 years ago. His smoking use included Cigarettes. He has a 25 pack-year smoking history. He has never used smokeless tobacco. He reports that he does not drink alcohol or use illicit drugs.  Family History family history includes Cancer in his sister; Diabetes in his brother and mother; Heart attack in his mother; Heart disease in his father; Stroke in his father and mother.  Review of Systems  Constitutional: Negative.   Respiratory: Negative.   Cardiovascular: Negative.   Gastrointestinal: Negative.   Musculoskeletal: Positive for arthralgias.  Skin: Negative.   Neurological: Negative.   Hematological: Negative.   Psychiatric/Behavioral: Negative.   All other systems reviewed and are negative.   BP 160/80 mmHg  Ht 6\' 1"  (1.854 m)  Wt 229 lb (103.874 kg)  BMI 30.22 kg/m2  Physical Exam  Constitutional: He is oriented to person, place, and time. He appears well-developed and well-nourished.  HENT:  Head: Normocephalic.  Nose: Nose normal.  Mouth/Throat: Oropharynx is clear and moist.  Eyes: Conjunctivae are normal. Pupils are equal, round, and reactive to light.  Neck: Normal range of motion. Neck supple. No JVD present.  Cardiovascular: Normal rate, regular rhythm, S1 normal, S2 normal, normal heart sounds and intact distal pulses.  Exam reveals no  gallop and no friction rub.   No murmur heard. Pulmonary/Chest: Effort normal and breath sounds normal. No respiratory distress. He has no wheezes. He has no rales. He exhibits no tenderness.  Abdominal: Soft. Bowel sounds are normal. He exhibits no distension. There is no tenderness.  Musculoskeletal: Normal range of motion. He exhibits no edema or tenderness.  Lymphadenopathy:    He has no cervical adenopathy.  Neurological: He is alert and oriented to person, place, and time. Coordination normal.  Skin: Skin is warm and dry. No rash noted. No erythema.  Psychiatric: He has a normal mood and affect. His behavior is normal. Judgment and thought content normal.      Assessment and Plan   Nursing note and vitals reviewed.

## 2015-05-04 NOTE — Assessment & Plan Note (Signed)
Last cholesterol in March above goal, this was discussed with him. He will try to take more Vytorin on a regular basis Goal LDL less than 70

## 2015-05-04 NOTE — Assessment & Plan Note (Signed)
We have encouraged continued exercise, careful diet management in an effort to lose weight. This will also help his cholesterol

## 2015-05-04 NOTE — Assessment & Plan Note (Signed)
Stopped 20 years ago Prior to that did smoke heavily

## 2015-05-04 NOTE — Assessment & Plan Note (Signed)
Stressed importance of aggressive cholesterol management Follow-up ultrasound in one year

## 2015-05-04 NOTE — Assessment & Plan Note (Signed)
Currently with no symptoms of angina. No further workup at this time. Continue current medication regimen. 

## 2015-05-04 NOTE — Assessment & Plan Note (Signed)
Currently with no symptoms of chest pain. Numerous prior episodes though none recently with exertion. No further testing at this time Appears relatively stable. Stress importance of better diabetes control, weight loss, better cholesterol control Currently not a smoker

## 2015-05-05 ENCOUNTER — Encounter: Payer: Self-pay | Admitting: Family Medicine

## 2015-05-05 ENCOUNTER — Ambulatory Visit (INDEPENDENT_AMBULATORY_CARE_PROVIDER_SITE_OTHER): Payer: PPO | Admitting: Family Medicine

## 2015-05-05 VITALS — BP 140/70 | HR 84 | Temp 98.3°F | Wt 230.5 lb

## 2015-05-05 DIAGNOSIS — J0111 Acute recurrent frontal sinusitis: Secondary | ICD-10-CM | POA: Insufficient documentation

## 2015-05-05 DIAGNOSIS — J019 Acute sinusitis, unspecified: Secondary | ICD-10-CM | POA: Diagnosis not present

## 2015-05-05 MED ORDER — AMOXICILLIN-POT CLAVULANATE 875-125 MG PO TABS: 1.0000 | ORAL_TABLET | Freq: Two times a day (BID) | ORAL | Status: AC

## 2015-05-05 NOTE — Assessment & Plan Note (Signed)
Anticipate left maxillary sinus inflammation/infection. Discussed possible etiologies including allergic congestion, viral sinusitis. Doubt bacterial given short duration. Treat with fluids, rest, nasal saline, and flonase. Pt does not tolerate NSAIDs. Given h/o recurrent sinusitis, provided with WASP for 10d augmentin with discussion on when to fill. Pt agrees with plan.

## 2015-05-05 NOTE — Patient Instructions (Signed)
I think this is more allergy leading to sinus congestion versus possibly viral sinusitis. I don't think this is bacterial. Treat with flonase nasal steroid 2 puffs daily into each nostril, start nasal saline spray throughout the day and start plain mucinex with plenty of fluids to help mobilize mucous. If worsening (fever >101, worsening facial pain or more productive sputum) or if not improving after 7-10 days of illness, fill antibiotic provided today

## 2015-05-05 NOTE — Progress Notes (Signed)
BP 140/70 mmHg  Pulse 84  Temp(Src) 98.3 F (36.8 C) (Oral)  Wt 230 lb 8 oz (104.554 kg)   CC: clogged ear  Subjective:    Patient ID: [name], male    DOB: 1940/10/10, 74 y.o.   MRN: [id number]  HPI: [name] is a 74 y.o. male presenting on 05/05/2015 for Sinusitis   2 days h/o mild sinus congestion with L muffled hearing. Some sor throat. Some chest congestion as well.   No fevers/chills, ear or tooth pain, PNdrainage, coughing.   + sick contacts around.  Has been run down - working at [employer].  Hasn't tried anything for this in the past.   Ex smoker. No h/o COPD or asthma.  + h/o PNA 07/2014.   H/o recurrent sinusitis and chronic sinus issues had been recommended surgery. Had sinusitis 08/2014 and again 01/2015.   Relevant past medical, surgical, family and social history reviewed and updated as indicated. Interim medical history since our last visit reviewed. Allergies and medications reviewed and updated. Current Outpatient Prescriptions on File Prior to Visit  Medication Sig  . aspirin 81 MG tablet Take 81 mg by mouth daily.   Marland Kitchen ezetimibe-simvastatin (VYTORIN) 10-40 MG per tablet Take 1 tablet by mouth at bedtime. Monday Wednesday and Friday  . finasteride (PROSCAR) 5 MG tablet Take 5 mg by mouth daily.    . mirabegron ER (MYRBETRIQ) 50 MG TB24 tablet Take 50 mg by mouth daily.  Marland Kitchen omeprazole (PRILOSEC) 40 MG capsule Take 40 mg by mouth daily.  Marland Kitchen triamcinolone cream (KENALOG) 0.1 % Apply 1 application topically 2 (two) times daily.  . Testosterone (ANDROGEL) 20.25 MG/1.25GM (1.62%) GEL Place onto the skin every morning.   No current facility-administered medications on file prior to visit.    Review of Systems Per HPI unless specifically indicated above     Objective:    BP 140/70 mmHg  Pulse 84  Temp(Src) 98.3 F (36.8 C) (Oral)  Wt 230 lb 8 oz (104.554 kg)  Wt Readings from Last 3 Encounters:  05/05/15 230 lb 8 oz (104.554 kg)  05/04/15  229 lb (103.874 kg)  03/31/15 227 lb (102.967 kg)    Physical Exam  Constitutional: He appears well-developed and well-nourished. No distress.  HENT:  Head: Normocephalic and atraumatic.  Right Ear: Hearing, tympanic membrane, external ear and ear canal normal.  Left Ear: Hearing, tympanic membrane, external ear and ear canal normal.  Nose: Mucosal edema and rhinorrhea present. Right sinus exhibits no maxillary sinus tenderness and no frontal sinus tenderness. Left sinus exhibits no maxillary sinus tenderness and no frontal sinus tenderness.  Mouth/Throat: Uvula is midline and mucous membranes are normal. Posterior oropharyngeal erythema present. No oropharyngeal exudate, posterior oropharyngeal edema or tonsillar abscesses.  L maxillary pressure but no pain  Eyes: Conjunctivae and EOM are normal. Pupils are equal, round, and reactive to light. No scleral icterus.  Neck: Normal range of motion. Neck supple.  Cardiovascular: Normal rate, regular rhythm, normal heart sounds and intact distal pulses.   No murmur heard. Pulmonary/Chest: Effort normal and breath sounds normal. No respiratory distress. He has no wheezes. He has no rales.  Lymphadenopathy:    He has no cervical adenopathy.  Skin: Skin is warm and dry. No rash noted.  Nursing note and vitals reviewed.  Lab Results  Component Value Date   HGBA1C 6.3 01/24/2014       Assessment & Plan:   Problem List Items Addressed This Visit  Acute sinusitis - Primary    Anticipate left maxillary sinus inflammation/infection. Discussed possible etiologies including allergic congestion, viral sinusitis. Doubt bacterial given short duration. Treat with fluids, rest, nasal saline, and flonase. Pt does not tolerate NSAIDs. Given h/o recurrent sinusitis, provided with WASP for 10d augmentin with discussion on when to fill. Pt agrees with plan.      Relevant Medications   amoxicillin-clavulanate (AUGMENTIN) 875-125 MG per tablet        Follow up plan: Return if symptoms worsen or fail to improve.

## 2015-05-05 NOTE — Progress Notes (Signed)
Pre visit review using our clinic review tool, if applicable. No additional management support is needed unless otherwise documented below in the visit note. 

## 2015-06-29 ENCOUNTER — Other Ambulatory Visit: Payer: Self-pay | Admitting: Family Medicine

## 2015-06-29 ENCOUNTER — Other Ambulatory Visit (INDEPENDENT_AMBULATORY_CARE_PROVIDER_SITE_OTHER): Payer: PPO

## 2015-06-29 DIAGNOSIS — E785 Hyperlipidemia, unspecified: Secondary | ICD-10-CM | POA: Diagnosis not present

## 2015-06-29 DIAGNOSIS — E538 Deficiency of other specified B group vitamins: Secondary | ICD-10-CM

## 2015-06-29 DIAGNOSIS — I10 Essential (primary) hypertension: Secondary | ICD-10-CM

## 2015-06-29 DIAGNOSIS — R7303 Prediabetes: Secondary | ICD-10-CM

## 2015-06-29 DIAGNOSIS — N138 Other obstructive and reflux uropathy: Secondary | ICD-10-CM

## 2015-06-29 DIAGNOSIS — R7989 Other specified abnormal findings of blood chemistry: Secondary | ICD-10-CM

## 2015-06-29 DIAGNOSIS — N401 Enlarged prostate with lower urinary tract symptoms: Secondary | ICD-10-CM | POA: Diagnosis not present

## 2015-06-29 LAB — HEMOGLOBIN A1C: Hgb A1c MFr Bld: 6.5 % (ref 4.6–6.5)

## 2015-06-29 LAB — LIPID PANEL
Cholesterol: 142 mg/dL (ref 0–200)
HDL: 35.8 mg/dL — ABNORMAL LOW (ref >39.00)
LDL Cholesterol: 81 mg/dL (ref 0–99)
NonHDL: 105.78
Total CHOL/HDL Ratio: 4
Triglycerides: 125 mg/dL (ref 0.0–149.0)
VLDL: 25 mg/dL (ref 0.0–40.0)

## 2015-06-29 LAB — COMPREHENSIVE METABOLIC PANEL
ALT: 14 U/L (ref 0–53)
AST: 13 U/L (ref 0–37)
Albumin: 4.4 g/dL (ref 3.5–5.2)
Alkaline Phosphatase: 43 U/L (ref 39–117)
BUN: 24 mg/dL — ABNORMAL HIGH (ref 6–23)
CO2: 30 mEq/L (ref 19–32)
Calcium: 9.6 mg/dL (ref 8.4–10.5)
Chloride: 101 mEq/L (ref 96–112)
Creatinine, Ser: 1.22 mg/dL (ref 0.40–1.50)
GFR: 61.68 mL/min (ref >60.00)
Glucose, Bld: 121 mg/dL — ABNORMAL HIGH (ref 70–99)
Potassium: 4.3 mEq/L (ref 3.5–5.1)
Sodium: 140 mEq/L (ref 135–145)
Total Bilirubin: 0.7 mg/dL (ref 0.2–1.2)
Total Protein: 6.8 g/dL (ref 6.0–8.3)

## 2015-06-29 LAB — VITAMIN B12: Vitamin B-12: 239 pg/mL (ref 211–911)

## 2015-06-29 LAB — PSA: PSA: 0.22 ng/mL (ref 0.10–4.00)

## 2015-07-03 ENCOUNTER — Ambulatory Visit (INDEPENDENT_AMBULATORY_CARE_PROVIDER_SITE_OTHER): Payer: PPO | Admitting: Family Medicine

## 2015-07-03 ENCOUNTER — Encounter: Payer: Self-pay | Admitting: Family Medicine

## 2015-07-03 VITALS — BP 136/72 | HR 76 | Temp 97.7°F | Ht 73.0 in | Wt 226.8 lb

## 2015-07-03 DIAGNOSIS — Z7189 Other specified counseling: Secondary | ICD-10-CM | POA: Insufficient documentation

## 2015-07-03 DIAGNOSIS — E785 Hyperlipidemia, unspecified: Secondary | ICD-10-CM

## 2015-07-03 DIAGNOSIS — Z87891 Personal history of nicotine dependence: Secondary | ICD-10-CM

## 2015-07-03 DIAGNOSIS — E119 Type 2 diabetes mellitus without complications: Secondary | ICD-10-CM

## 2015-07-03 DIAGNOSIS — I25810 Atherosclerosis of coronary artery bypass graft(s) without angina pectoris: Secondary | ICD-10-CM

## 2015-07-03 DIAGNOSIS — Z0000 Encounter for general adult medical examination without abnormal findings: Secondary | ICD-10-CM | POA: Diagnosis not present

## 2015-07-03 DIAGNOSIS — I6523 Occlusion and stenosis of bilateral carotid arteries: Secondary | ICD-10-CM

## 2015-07-03 DIAGNOSIS — I739 Peripheral vascular disease, unspecified: Secondary | ICD-10-CM

## 2015-07-03 DIAGNOSIS — N138 Other obstructive and reflux uropathy: Secondary | ICD-10-CM

## 2015-07-03 DIAGNOSIS — K219 Gastro-esophageal reflux disease without esophagitis: Secondary | ICD-10-CM

## 2015-07-03 DIAGNOSIS — N401 Enlarged prostate with lower urinary tract symptoms: Secondary | ICD-10-CM

## 2015-07-03 DIAGNOSIS — M1990 Unspecified osteoarthritis, unspecified site: Secondary | ICD-10-CM

## 2015-07-03 DIAGNOSIS — I10 Essential (primary) hypertension: Secondary | ICD-10-CM

## 2015-07-03 NOTE — Assessment & Plan Note (Signed)
Followed by ortho. Recent hip bursal and knee injections.

## 2015-07-03 NOTE — Patient Instructions (Addendum)
Advanced directive packet provided today. Sugar returned elevated - watch added sugars and sweetened beverages and simple carbs. You are doing well today. Return in 6 months for follow up diabetes.  Health Maintenance, Male A healthy lifestyle and preventative care can promote health and wellness.  Maintain regular health, dental, and eye exams.  Eat a healthy diet. Foods like vegetables, fruits, whole grains, low-fat dairy products, and lean protein foods contain the nutrients you need and are low in calories. Decrease your intake of foods high in solid fats, added sugars, and salt. Get information about a proper diet from your health care provider, if necessary.  Regular physical exercise is one of the most important things you can do for your health. Most adults should get at least 150 minutes of moderate-intensity exercise (any activity that increases your heart rate and causes you to sweat) each week. In addition, most adults need muscle-strengthening exercises on 2 or more days a week.   Maintain a healthy weight. The body mass index (BMI) is a screening tool to identify possible weight problems. It provides an estimate of body fat based on height and weight. Your health care provider can find your BMI and can help you achieve or maintain a healthy weight. For males 20 years and older:  A BMI below 18.5 is considered underweight.  A BMI of 18.5 to 24.9 is normal.  A BMI of 25 to 29.9 is considered overweight.  A BMI of 30 and above is considered obese.  Maintain normal blood lipids and cholesterol by exercising and minimizing your intake of saturated fat. Eat a balanced diet with plenty of fruits and vegetables. Blood tests for lipids and cholesterol should begin at age 72 and be repeated every 5 years. If your lipid or cholesterol levels are high, you are over age 34, or you are at high risk for heart disease, you may need your cholesterol levels checked more frequently.Ongoing high  lipid and cholesterol levels should be treated with medicines if diet and exercise are not working.  If you smoke, find out from your health care provider how to quit. If you do not use tobacco, do not start.  Lung cancer screening is recommended for adults aged 51-80 years who are at high risk for developing lung cancer because of a history of smoking. A yearly low-dose CT scan of the lungs is recommended for people who have at least a 30-pack-year history of smoking and are current smokers or have quit within the past 15 years. A pack year of smoking is smoking an average of 1 pack of cigarettes a day for 1 year (for example, a 30-pack-year history of smoking could mean smoking 1 pack a day for 30 years or 2 packs a day for 15 years). Yearly screening should continue until the smoker has stopped smoking for at least 15 years. Yearly screening should be stopped for people who develop a health problem that would prevent them from having lung cancer treatment.  If you choose to drink alcohol, do not have more than 2 drinks per day. One drink is considered to be 12 oz (360 mL) of beer, 5 oz (150 mL) of wine, or 1.5 oz (45 mL) of liquor.  Avoid the use of street drugs. Do not share needles with anyone. Ask for help if you need support or instructions about stopping the use of drugs.  High blood pressure causes heart disease and increases the risk of stroke. High blood pressure is more likely to  develop in:  People who have blood pressure in the end of the normal range (100-139/85-89 mm Hg).  People who are overweight or obese.  People who are African American.  If you are 33-10 years of age, have your blood pressure checked every 3-5 years. If you are 73 years of age or older, have your blood pressure checked every year. You should have your blood pressure measured twice--once when you are at a hospital or clinic, and once when you are not at a hospital or clinic. Record the average of the two  measurements. To check your blood pressure when you are not at a hospital or clinic, you can use:  An automated blood pressure machine at a pharmacy.  A home blood pressure monitor.  If you are 69-67 years old, ask your health care provider if you should take aspirin to prevent heart disease.  Diabetes screening involves taking a blood sample to check your fasting blood sugar level. This should be done once every 3 years after age 61 if you are at a normal weight and without risk factors for diabetes. Testing should be considered at a younger age or be carried out more frequently if you are overweight and have at least 1 risk factor for diabetes.  Colorectal cancer can be detected and often prevented. Most routine colorectal cancer screening begins at the age of 41 and continues through age 55. However, your health care provider may recommend screening at an earlier age if you have risk factors for colon cancer. On a yearly basis, your health care provider may provide home test kits to check for hidden blood in the stool. A small camera at the end of a tube may be used to directly examine the colon (sigmoidoscopy or colonoscopy) to detect the earliest forms of colorectal cancer. Talk to your health care provider about this at age 39 when routine screening begins. A direct exam of the colon should be repeated every 5-10 years through age 66, unless early forms of precancerous polyps or small growths are found.  People who are at an increased risk for hepatitis B should be screened for this virus. You are considered at high risk for hepatitis B if:  You were born in a country where hepatitis B occurs often. Talk with your health care provider about which countries are considered high risk.  Your parents were born in a high-risk country and you have not received a shot to protect against hepatitis B (hepatitis B vaccine).  You have HIV or AIDS.  You use needles to inject street drugs.  You live  with, or have sex with, someone who has hepatitis B.  You are a man who has sex with other men (MSM).  You get hemodialysis treatment.  You take certain medicines for conditions like cancer, organ transplantation, and autoimmune conditions.  Hepatitis C blood testing is recommended for all people born from [birth year] through 1965 and any individual with known risk factors for hepatitis C.  Healthy men should no longer receive prostate-specific antigen (PSA) blood tests as part of routine cancer screening. Talk to your health care provider about prostate cancer screening.  Testicular cancer screening is not recommended for adolescents or adult males who have no symptoms. Screening includes self-exam, a health care provider exam, and other screening tests. Consult with your health care provider about any symptoms you have or any concerns you have about testicular cancer.  Practice safe sex. Use condoms and avoid high-risk sexual practices to reduce  the spread of sexually transmitted infections (STIs).  You should be screened for STIs, including gonorrhea and chlamydia if:  You are sexually active and are younger than 24 years.  You are older than 24 years, and your health care provider tells you that you are at risk for this type of infection.  Your sexual activity has changed since you were last screened, and you are at an increased risk for chlamydia or gonorrhea. Ask your health care provider if you are at risk.  If you are at risk of being infected with HIV, it is recommended that you take a prescription medicine daily to prevent HIV infection. This is called pre-exposure prophylaxis (PrEP). You are considered at risk if:  You are a man who has sex with other men (MSM).  You are a heterosexual man who is sexually active with multiple partners.  You take drugs by injection.  You are sexually active with a partner who has HIV.  Talk with your health care provider about whether you are at  high risk of being infected with HIV. If you choose to begin PrEP, you should first be tested for HIV. You should then be tested every 3 months for as long as you are taking PrEP.  Use sunscreen. Apply sunscreen liberally and repeatedly throughout the day. You should seek shade when your shadow is shorter than you. Protect yourself by wearing long sleeves, pants, a wide-brimmed hat, and sunglasses year round whenever you are outdoors.  Tell your health care provider of new moles or changes in moles, especially if there is a change in shape or color. Also, tell your health care provider if a mole is larger than the size of a pencil eraser.  A one-time screening for abdominal aortic aneurysm (AAA) and surgical repair of large AAAs by ultrasound is recommended for men aged 32-75 years who are current or former smokers.  Stay current with your vaccines (immunizations).   This information is not intended to replace advice given to you by your health care provider. Make sure you discuss any questions you have with your health care provider.   Document Released: 01/28/2008 Document Revised: 08/22/2014 Document Reviewed: 12/27/2010 Elsevier Interactive Patient Education [employer].

## 2015-07-03 NOTE — Progress Notes (Signed)
Pre visit review using our clinic review tool, if applicable. No additional management support is needed unless otherwise documented below in the visit note. 

## 2015-07-03 NOTE — Assessment & Plan Note (Signed)
Currently asxs on statin and aspirin.

## 2015-07-03 NOTE — Assessment & Plan Note (Signed)
Stable, asxs. Continue aspirin, statin. 

## 2015-07-03 NOTE — Assessment & Plan Note (Signed)
Followed by Dr Karsten Ro urology.

## 2015-07-03 NOTE — Assessment & Plan Note (Signed)
Stable on PPI - continue

## 2015-07-03 NOTE — Assessment & Plan Note (Signed)
A1c up to 6.5% - will return in 6 mo to rechek. Discussed importance of avoiding added sugars and simple carbs in diet.

## 2015-07-03 NOTE — Assessment & Plan Note (Addendum)
Not due yet for carotid US. Followed by cards.

## 2015-07-03 NOTE — Progress Notes (Addendum)
BP 136/72 mmHg  Pulse 76  Temp(Src) 97.7 F (36.5 C) (Oral)  Ht 6\' 1"  (1.854 m)  Wt 226 lb 12 oz (102.853 kg)  BMI 29.92 kg/m2   CC: CPE  Subjective:    Patient ID: [name], male    DOB: [birth year], 74 y.o.   MRN: [id number]  HPI: [name] is a 74 y.o. male presenting on 07/03/2015 for No chief complaint on file.   Carotid stenosis followed by Dr Rockey Situ. Saw ortho last week - s/p knee and hip injection [name]). Known OA.   Hearing screen - passed Vision screen - passed Fall risk screen - 1 mechanical fall with injury while fishing - bruised tailbone Depression screen - passed  Preventative: Colon cancer screening - colonoscopy 03/2010 HP polyp, diverticulosis, rpt 10 yrs [facility] [place]) Prostate cancer screening - sees Dr Karsten Ro  Lung cancer screening - not eligible Flu shot - yearly Tetanus shot - Td 2007 Pneumovax 10/2008, prevnar 01/2015 Shingles shot - 2010 Advanced directive discussion - unsure. Will check at home. Packet provided today.  Seat belt use discussed  Sunscreen use and skin screen discussed. No changing moles on skin.   Married, wife [name], for 25 years in 2016. 1 daughter and 3 grandkids from first marriage. 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  Diet: good water, fruits/vegetables daily  Relevant past medical, surgical, family and social history reviewed and updated as indicated. Interim medical history since our last visit reviewed. Allergies and medications reviewed and updated. Current Outpatient Prescriptions on File Prior to Visit  Medication Sig  . aspirin 81 MG tablet Take 81 mg by mouth daily.   Marland Kitchen ezetimibe-simvastatin (VYTORIN) 10-40 MG per tablet Take 1 tablet by mouth at bedtime. Monday Wednesday and Friday  . finasteride (PROSCAR) 5 MG tablet Take 5 mg by mouth daily.    Marland Kitchen omeprazole  (PRILOSEC) 40 MG capsule Take 40 mg by mouth daily.  . Testosterone (ANDROGEL) 20.25 MG/1.25GM (1.62%) GEL Place onto the skin every morning.  . triamcinolone cream (KENALOG) 0.1 % Apply 1 application topically 2 (two) times daily. (Patient not taking: Reported on 07/03/2015)   No current facility-administered medications on file prior to visit.    Review of Systems Per HPI unless specifically indicated in ROS section     Objective:    BP 136/72 mmHg  Pulse 76  Temp(Src) 97.7 F (36.5 C) (Oral)  Ht 6\' 1"  (1.854 m)  Wt 226 lb 12 oz (102.853 kg)  BMI 29.92 kg/m2  Wt Readings from Last 3 Encounters:  07/03/15 226 lb 12 oz (102.853 kg)  05/05/15 230 lb 8 oz (104.554 kg)  05/04/15 229 lb (103.874 kg)    Physical Exam  Constitutional: He is oriented to person, place, and time. He appears well-developed and well-nourished. No distress.  HENT:  Head: Normocephalic and atraumatic.  Right Ear: Hearing, tympanic membrane, external ear and ear canal normal.  Left Ear: Hearing, tympanic membrane, external ear and ear canal normal.  Nose: Nose normal.  Mouth/Throat: Uvula is midline, oropharynx is clear and moist and mucous membranes are normal. No oropharyngeal exudate, posterior oropharyngeal edema or posterior oropharyngeal erythema.  Eyes: Conjunctivae and EOM are normal. Pupils are equal, round, and reactive to light. No scleral icterus.  Neck: Normal range of motion. Neck supple. Carotid bruit is not present. No thyromegaly present.  Cardiovascular:  Normal rate, regular rhythm, normal heart sounds and intact distal pulses.   No murmur heard. Pulses:      Radial pulses are 2+ on the right side, and 2+ on the left side.  Pulmonary/Chest: Effort normal and breath sounds normal. No respiratory distress. He has no wheezes. He has no rales.  Abdominal: Soft. Bowel sounds are normal. He exhibits no distension and no mass. There is no tenderness. There is no rebound and no guarding.    Musculoskeletal: Normal range of motion. He exhibits no edema.  Lymphadenopathy:    He has no cervical adenopathy.  Neurological: He is alert and oriented to person, place, and time.  CN grossly intact, station and gait intact Recall 2/3, 3/3 with cue Calculation 5/5 serial 3s  Skin: Skin is warm and dry. No rash noted.  Psychiatric: He has a normal mood and affect. His behavior is normal. Judgment and thought content normal.  Nursing note and vitals reviewed.  Results for orders placed or performed in visit on 07/03/15  HM COLONOSCOPY  Result Value Ref Range   HM Colonoscopy HP polyp, diverticulosis, rpt 10 yrs ([name])       Assessment & Plan:   Problem List Items Addressed This Visit    PVD (peripheral vascular disease) with claudication ([place])    Currently asxs on statin and aspirin.      Osteoarthritis    Followed by ortho. Recent hip bursal and knee injections.      Medicare annual wellness visit, subsequent - Primary    I have personally reviewed the Medicare Annual Wellness questionnaire and have noted 1. The patient's medical and social history 2. Their use of alcohol, tobacco or illicit drugs 3. Their current medications and supplements 4. The patient's functional ability including ADL's, fall risks, home safety risks and hearing or visual impairment. Cognitive function has been assessed and addressed as indicated.  5. Diet and physical activity 6. Evidence for depression or mood disorders The patients weight, height, BMI have been recorded in the chart. I have made referrals, counseling and provided education to the patient based on review of the above and I have provided the pt with a written personalized care plan for preventive services. Provider list updated.. See scanned questionairre as needed for further documentation. Reviewed preventative protocols and updated unless pt declined.       Hyperlipidemia    Good control on vytorin - continue. Pt worried  about cost.  Goal LDL <70.      GERD    Stable on PPI - continue      Ex-smoker    25 PY history, quit 26 yrs ago      Essential hypertension    bp stable today. H/o white coat hypertension      Diet-controlled diabetes mellitus (HCC)    A1c up to 6.5% - will return in 6 mo to rechek. Discussed importance of avoiding added sugars and simple carbs in diet.      Carotid stenosis    Not due yet for carotid US. Followed by cards.      CAD, ARTERY BYPASS GRAFT    Stable, asxs. Continue aspirin, statin.      BPH (benign prostatic hypertrophy) with urinary obstruction    Followed by Dr Karsten Ro urology.      Advanced care planning/counseling discussion    Advanced directive discussion - unsure. Will check at home. Packet provided today.           Follow up plan: Return  in about 6 months (around 12/31/2015), or as needed, for follow up visit.

## 2015-07-03 NOTE — Assessment & Plan Note (Addendum)
25 PY history, quit 26 yrs ago

## 2015-07-03 NOTE — Assessment & Plan Note (Signed)

## 2015-07-03 NOTE — Assessment & Plan Note (Addendum)
Good control on vytorin - continue. Pt worried about cost.  Goal LDL <70.

## 2015-07-03 NOTE — Assessment & Plan Note (Signed)
Advanced directive discussion - unsure. Will check at home. Packet provided today.

## 2015-07-03 NOTE — Assessment & Plan Note (Signed)
bp stable today. H/o white coat hypertension

## 2015-07-07 ENCOUNTER — Other Ambulatory Visit: Payer: Self-pay | Admitting: Cardiovascular Disease

## 2015-07-07 ENCOUNTER — Encounter: Payer: Self-pay | Admitting: *Deleted

## 2015-07-07 ENCOUNTER — Telehealth: Payer: Self-pay | Admitting: *Deleted

## 2015-07-07 ENCOUNTER — Ambulatory Visit
Admission: RE | Admit: 2015-07-07 | Discharge: 2015-07-07 | Disposition: A | Discharge status: Home / Self Care | Payer: PPO | Source: Ambulatory Visit | Attending: Cardiovascular Disease | Admitting: Cardiovascular Disease

## 2015-07-07 ENCOUNTER — Encounter
Admission: RE | Disposition: A | Discharge status: Home / Self Care | Payer: Self-pay | Source: Ambulatory Visit | Attending: Cardiovascular Disease

## 2015-07-07 ENCOUNTER — Encounter: Payer: Self-pay | Admitting: Cardiovascular Disease

## 2015-07-07 ENCOUNTER — Ambulatory Visit: Admission: RE | Admit: 2015-07-07 | Payer: PPO | Source: Ambulatory Visit | Admitting: Cardiovascular Disease

## 2015-07-07 ENCOUNTER — Encounter: Admission: AD | Payer: Self-pay | Source: Ambulatory Visit

## 2015-07-07 ENCOUNTER — Other Ambulatory Visit: Payer: Self-pay

## 2015-07-07 ENCOUNTER — Ambulatory Visit (INDEPENDENT_AMBULATORY_CARE_PROVIDER_SITE_OTHER): Payer: PPO | Admitting: Cardiovascular Disease

## 2015-07-07 ENCOUNTER — Ambulatory Visit: Admission: AD | Admit: 2015-07-07 | Payer: Self-pay | Source: Ambulatory Visit | Admitting: Cardiovascular Disease

## 2015-07-07 VITALS — BP 120/88 | HR 82 | Resp 16 | Ht 73.0 in | Wt 223.2 lb

## 2015-07-07 DIAGNOSIS — G4733 Obstructive sleep apnea (adult) (pediatric): Secondary | ICD-10-CM | POA: Insufficient documentation

## 2015-07-07 DIAGNOSIS — Z9842 Cataract extraction status, left eye: Secondary | ICD-10-CM | POA: Diagnosis not present

## 2015-07-07 DIAGNOSIS — R0602 Shortness of breath: Secondary | ICD-10-CM | POA: Insufficient documentation

## 2015-07-07 DIAGNOSIS — Z7982 Long term (current) use of aspirin: Secondary | ICD-10-CM | POA: Insufficient documentation

## 2015-07-07 DIAGNOSIS — Z87891 Personal history of nicotine dependence: Secondary | ICD-10-CM | POA: Insufficient documentation

## 2015-07-07 DIAGNOSIS — R5381 Other malaise: Secondary | ICD-10-CM | POA: Insufficient documentation

## 2015-07-07 DIAGNOSIS — I200 Unstable angina: Secondary | ICD-10-CM

## 2015-07-07 DIAGNOSIS — I714 Abdominal aortic aneurysm, without rupture: Secondary | ICD-10-CM | POA: Diagnosis not present

## 2015-07-07 DIAGNOSIS — Z8249 Family history of ischemic heart disease and other diseases of the circulatory system: Secondary | ICD-10-CM | POA: Diagnosis not present

## 2015-07-07 DIAGNOSIS — R0600 Dyspnea, unspecified: Secondary | ICD-10-CM

## 2015-07-07 DIAGNOSIS — I6523 Occlusion and stenosis of bilateral carotid arteries: Secondary | ICD-10-CM | POA: Diagnosis not present

## 2015-07-07 DIAGNOSIS — I2510 Atherosclerotic heart disease of native coronary artery without angina pectoris: Secondary | ICD-10-CM | POA: Diagnosis present

## 2015-07-07 DIAGNOSIS — Z809 Family history of malignant neoplasm, unspecified: Secondary | ICD-10-CM | POA: Diagnosis not present

## 2015-07-07 DIAGNOSIS — N411 Chronic prostatitis: Secondary | ICD-10-CM | POA: Insufficient documentation

## 2015-07-07 DIAGNOSIS — E785 Hyperlipidemia, unspecified: Secondary | ICD-10-CM | POA: Insufficient documentation

## 2015-07-07 DIAGNOSIS — R079 Chest pain, unspecified: Secondary | ICD-10-CM

## 2015-07-07 DIAGNOSIS — Z951 Presence of aortocoronary bypass graft: Secondary | ICD-10-CM | POA: Insufficient documentation

## 2015-07-07 DIAGNOSIS — Z823 Family history of stroke: Secondary | ICD-10-CM | POA: Insufficient documentation

## 2015-07-07 DIAGNOSIS — R531 Weakness: Secondary | ICD-10-CM | POA: Diagnosis not present

## 2015-07-07 DIAGNOSIS — I209 Angina pectoris, unspecified: Secondary | ICD-10-CM | POA: Diagnosis not present

## 2015-07-07 DIAGNOSIS — Z86010 Personal history of colonic polyps: Secondary | ICD-10-CM | POA: Insufficient documentation

## 2015-07-07 DIAGNOSIS — Z8669 Personal history of other diseases of the nervous system and sense organs: Secondary | ICD-10-CM | POA: Insufficient documentation

## 2015-07-07 DIAGNOSIS — M1990 Unspecified osteoarthritis, unspecified site: Secondary | ICD-10-CM | POA: Diagnosis not present

## 2015-07-07 DIAGNOSIS — I25810 Atherosclerosis of coronary artery bypass graft(s) without angina pectoris: Secondary | ICD-10-CM

## 2015-07-07 DIAGNOSIS — E119 Type 2 diabetes mellitus without complications: Secondary | ICD-10-CM

## 2015-07-07 DIAGNOSIS — I25110 Atherosclerotic heart disease of native coronary artery with unstable angina pectoris: Secondary | ICD-10-CM | POA: Diagnosis not present

## 2015-07-07 DIAGNOSIS — R0609 Other forms of dyspnea: Secondary | ICD-10-CM

## 2015-07-07 DIAGNOSIS — Z01812 Encounter for preprocedural laboratory examination: Secondary | ICD-10-CM | POA: Diagnosis not present

## 2015-07-07 DIAGNOSIS — Z888 Allergy status to other drugs, medicaments and biological substances status: Secondary | ICD-10-CM | POA: Insufficient documentation

## 2015-07-07 DIAGNOSIS — I10 Essential (primary) hypertension: Secondary | ICD-10-CM

## 2015-07-07 DIAGNOSIS — Z885 Allergy status to narcotic agent status: Secondary | ICD-10-CM | POA: Diagnosis not present

## 2015-07-07 DIAGNOSIS — Z79899 Other long term (current) drug therapy: Secondary | ICD-10-CM | POA: Diagnosis not present

## 2015-07-07 DIAGNOSIS — Z01818 Encounter for other preprocedural examination: Secondary | ICD-10-CM

## 2015-07-07 DIAGNOSIS — Z833 Family history of diabetes mellitus: Secondary | ICD-10-CM | POA: Insufficient documentation

## 2015-07-07 DIAGNOSIS — Z9841 Cataract extraction status, right eye: Secondary | ICD-10-CM | POA: Insufficient documentation

## 2015-07-07 DIAGNOSIS — Z841 Family history of disorders of kidney and ureter: Secondary | ICD-10-CM | POA: Insufficient documentation

## 2015-07-07 DIAGNOSIS — I739 Peripheral vascular disease, unspecified: Secondary | ICD-10-CM

## 2015-07-07 DIAGNOSIS — I259 Chronic ischemic heart disease, unspecified: Secondary | ICD-10-CM | POA: Diagnosis not present

## 2015-07-07 HISTORY — PX: CARDIAC CATHETERIZATION: SHX172

## 2015-07-07 LAB — PROTIME-INR
INR: 1.1
Prothrombin Time: 14.4 seconds (ref 11.4–15.0)

## 2015-07-07 SURGERY — LEFT HEART CATH AND CORONARY ANGIOGRAPHY
Anesthesia: Moderate Sedation

## 2015-07-07 SURGERY — LEFT HEART CATH AND CORONARY ANGIOGRAPHY
Anesthesia: Moderate Sedation | Laterality: Bilateral

## 2015-07-07 MED ORDER — SODIUM CHLORIDE 0.9 % WEIGHT BASED INFUSION: 3.0000 mL/kg/h | INTRAVENOUS | Status: DC

## 2015-07-07 MED ORDER — HEPARIN (PORCINE) IN NACL 2-0.9 UNIT/ML-% IJ SOLN
INTRAMUSCULAR | Status: AC
  Filled 2015-07-07: qty 500

## 2015-07-07 MED ORDER — SODIUM CHLORIDE 0.9 % IJ SOLN: 3.0000 mL | INTRAMUSCULAR | Status: DC | PRN

## 2015-07-07 MED ORDER — IOHEXOL 300 MG/ML  SOLN
INTRAMUSCULAR | Status: DC | PRN
  Administered 2015-07-07: 150 mL via INTRA_ARTERIAL

## 2015-07-07 MED ORDER — FENTANYL CITRATE (PF) 100 MCG/2ML IJ SOLN
INTRAMUSCULAR | Status: DC | PRN
  Administered 2015-07-07: 50 ug via INTRAVENOUS

## 2015-07-07 MED ORDER — ASPIRIN 81 MG PO CHEW: 81.0000 mg | CHEWABLE_TABLET | ORAL | Status: DC

## 2015-07-07 MED ORDER — SODIUM CHLORIDE 0.9 % WEIGHT BASED INFUSION: 1.0000 mL/kg/h | INTRAVENOUS | Status: DC

## 2015-07-07 MED ORDER — SODIUM CHLORIDE 0.9 % IJ SOLN: 3.0000 mL | Freq: Two times a day (BID) | INTRAMUSCULAR | Status: DC

## 2015-07-07 MED ORDER — FENTANYL CITRATE (PF) 100 MCG/2ML IJ SOLN
INTRAMUSCULAR | Status: AC
  Filled 2015-07-07: qty 2

## 2015-07-07 MED ORDER — MIDAZOLAM HCL 2 MG/2ML IJ SOLN
INTRAMUSCULAR | Status: AC
  Filled 2015-07-07: qty 2

## 2015-07-07 MED ORDER — MIDAZOLAM HCL 2 MG/2ML IJ SOLN
INTRAMUSCULAR | Status: DC | PRN
  Administered 2015-07-07: 1 mg via INTRAVENOUS

## 2015-07-07 MED ORDER — SODIUM CHLORIDE 0.9 % IV SOLN: 250.0000 mL | INTRAVENOUS | Status: DC | PRN

## 2015-07-07 MED ORDER — ISOSORBIDE MONONITRATE ER 30 MG PO TB24: 30.0000 mg | ORAL_TABLET | Freq: Every day | ORAL | Status: DC

## 2015-07-07 SURGICAL SUPPLY — 12 items
CATH INFINITI 5 FR IM (CATHETERS) ×1 IMPLANT
CATH INFINITI 5FR ANG PIGTAIL (CATHETERS) ×2 IMPLANT
CATH INFINITI 5FR JL4 (CATHETERS) ×2 IMPLANT
CATH INFINITI JR4 5F (CATHETERS) ×2 IMPLANT
DEVICE CLOSURE MYNXGRIP 5F (Vascular Products) ×1 IMPLANT
KIT MANI 3VAL PERCEP (MISCELLANEOUS) ×2 IMPLANT
NDL PERC 18GX7CM (NEEDLE) ×1 IMPLANT
NEEDLE PERC 18GX7CM (NEEDLE) ×2 IMPLANT
PACK CARDIAC CATH (CUSTOM PROCEDURE TRAY) ×2 IMPLANT
SHEATH AVANTI 5FR X 11CM (SHEATH) ×2 IMPLANT
WIRE EMERALD 3MM-J .035X150CM (WIRE) ×2 IMPLANT
WIRE EMERALD 3MM-J .035X260CM (WIRE) ×1 IMPLANT

## 2015-07-07 NOTE — Assessment & Plan Note (Signed)
Prior anatomy unclear, we'll try to obtain surgical records Last catheterization over 15 years ago Currently with symptoms of unstable angina

## 2015-07-07 NOTE — Assessment & Plan Note (Signed)
We'll schedule periodic monitoring of his abdominal aortic aneurysm

## 2015-07-07 NOTE — Telephone Encounter (Signed)
Pt added on to schedule to see Dr. Rockey Situ today @ 9:40.

## 2015-07-07 NOTE — OR Nursing (Signed)
etCO2 monitoring D/C: 27 stable, awake

## 2015-07-07 NOTE — Assessment & Plan Note (Signed)
We have encouraged continued exercise, careful diet management in an effort to lose weight. 

## 2015-07-07 NOTE — Assessment & Plan Note (Signed)
Unstable angina on today's visit, worse over the past month Symptoms similar to prior anginal symptoms prior to stenting and bypass surgery Given the severity of his symptoms, getting worse over the past month, concern for unstable angina, will schedule cardiac catheterization today We'll try to avoid hospital admission through the emergency room. He was considering going to the emergency room this morning for his symptoms Continue aspirin, statin for now We have ordered basic lab work, chest x-ray Risk and benefit of the procedure was discussed with him including risk of stroke and heart attack. He is in agreement with proceeding with the procedure

## 2015-07-07 NOTE — Progress Notes (Signed)
Patient ID: [name], male    DOB: 16-Apr-1941, 74 y.o.   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 He has a AAA less than 3 cm, mild bilateral carotid arterial disease estimated at 40-50% bilaterally. Numerous previous episode of chest pain relieved with belching consistent with GI etiology. Prior stress test December 2014 showing no ischemia He has a diagnosis of obstructive sleep apnea. Could not handle CPAP despite numerous attempts. Symptoms better after weight loss total hip replacements x2 He presents today for follow-up of his coronary artery disease, PAD  In follow-up today, he reports having severe chest pain over the past month [place], had chest pain radiating up to his neck and arm, significant shortness of breath and sweating He has had stuttering anginal symptoms since that time Has been getting worse over the past month, woke up this morning with similar symptoms, feels similar to his prior anginal symptoms before bypass or stenting. Has not been telling his wife of all of the symptoms Currently does not feel well, general malaise, weakness, shortness of breath Nurse reports he had shortness of breath even getting up on the exam table, raising his shirt for EKG today  Recent blood work reviewed with him showing improved cholesterol close to goal, borderline glucose elevation, normal renal function  EKG on today's visit shows normal sinus rhythm with rate 79 bpm, nonspecific ST abnormality, left axis deviation Other past medical history   went to the emergency room in [place] 07/03/2013 with chest pain. Had a CT scan of his chest which was essentially benign. Nitroglycerin did not help his pain. He ruled out and  was sent home.  Repeat episode of chest pain 08/05/2013 with stress test at that time showing no significant ischemia, ejection fraction 48%. It was felt his chest pain was atypical in nature. She feels it was from GI etiology given his negative stress test.  He does report having some cold induced chest tightness/shortness of breath, asthma.    episode of chest pain 05/14/2013. He felt was indigestion. He took several nitroglycerin with no significant improvement in his symptoms. Symptoms were severe, felt similar to his previous MI. He got in his car and on the way, had a big burp. Symptoms seemed to resolve at that time. He continued on to the emergency room. There cardiac enzymes were negative x2, EKG unchanged. He reports having had prior chest pain episode relieved with belching in the past.   Allergies  Allergen Reactions  . Morphine   . Rofecoxib     Outpatient Encounter Prescriptions as of 07/07/2015  Medication Sig  . aspirin 81 MG tablet Take 81 mg by mouth daily.   Marland Kitchen ezetimibe-simvastatin (VYTORIN) 10-40 MG per tablet Take 1 tablet by mouth at bedtime. Monday Wednesday and Friday  . finasteride (PROSCAR) 5 MG tablet Take 5 mg by mouth daily.    Marland Kitchen omeprazole (PRILOSEC) 40 MG capsule Take 40 mg by mouth daily.  . solifenacin (VESICARE) 5 MG tablet Take 5 mg by mouth daily.  . Testosterone (ANDROGEL) 20.25 MG/1.25GM (1.62%) GEL Place onto the skin every morning.  . triamcinolone cream (KENALOG) 0.1 % Apply 1 application topically 2 (two) times daily.   No facility-administered encounter medications on file as of 07/07/2015.  Past Medical History  Diagnosis Date  . HYPERLIPIDEMIA 04/26/2007  . HYPERTENSION 04/30/2007  . 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 GERD 04/30/2007  . INGUINAL HERNIA, RIGHT 05/26/2010  . BENIGN PROSTATIC HYPERTROPHY, WITH URINARY OBSTRUCTION  09/06/2007  . 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 LOC OSTEOARTHROS NOT SPEC PRIM/SEC LOWER LEG 11/05/2009  . OSTEOARTHRITIS 04/26/2007  . 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 PSA, INCREASED 07/09/2008  . DVT, HX OF 1998  . Lumbar disc disease with radiculopathy   . Spinal stenosis of lumbar region   . History of shingles   . Pneumonia 07/2014    [facility] hospitalization    Past Surgical History  Procedure Laterality Date  . Appendectomy      rupture  . Coronary artery bypass graft  1990    3 vessel   . Cataract extraction, bilateral    . Total hip arthroplasty Bilateral 1999,2000  . Spine surgery  1984    lumbar for two ruptured discs/fusion  . Knee arthroscopy      x2  . Cystoscopy  12/23/10    [facility]  . Colonoscopy  03/2010    HP polyp, diverticulosis, rpt 10 yrs ([name])    Social History  reports that he quit smoking about 26 years ago. His smoking use included Cigarettes. He has a 25 pack-year smoking history. He has never used smokeless tobacco. He reports that he does not drink alcohol or use illicit drugs.  Family History family history includes Cancer in his other and sister; Diabetes in his brother and mother; Heart attack in his mother; Heart disease in his father; Kidney disease in his father and sister; Stroke in his father and mother.  Review of Systems  Constitutional:       Sweating  Respiratory: Positive for chest tightness and shortness of breath.   Cardiovascular: Positive for chest pain and palpitations.  Gastrointestinal: Negative.   Musculoskeletal: Positive for arthralgias.  Skin: Negative.   Neurological: Negative.   Hematological: Negative.   Psychiatric/Behavioral: Negative.   All other systems reviewed and are negative.   BP 120/88 mmHg  Pulse 82  Resp 16  Ht 6\' 1"  (1.854 m)  Wt 223 lb 4 oz (101.266 kg)  BMI 29.46 kg/m2  Physical Exam  Constitutional: He is oriented to person, place, and time. He appears well-developed and well-nourished.   HENT:  Head: Normocephalic.  Nose: Nose normal.  Mouth/Throat: Oropharynx is clear and moist.  Eyes: Conjunctivae are normal. Pupils are equal, round, and reactive to light.  Neck: Normal range of motion. Neck supple. No JVD present.  Cardiovascular: Normal rate, regular rhythm, S1 normal, S2 normal, normal heart sounds and intact distal pulses.  Exam reveals no gallop and no friction rub.   No murmur heard. Pulmonary/Chest: Effort normal and breath sounds normal. No respiratory distress. He has no wheezes. He has no rales. He exhibits no tenderness.  Abdominal: Soft. Bowel sounds are normal. He exhibits no distension. There is no tenderness.  Musculoskeletal: Normal range of motion. He exhibits no edema or tenderness.  Lymphadenopathy:    He has no cervical adenopathy.  Neurological: He is alert and oriented to person, place, and time. Coordination normal.  Skin: Skin is warm and dry. No rash noted. No erythema.  Psychiatric: He has a normal mood and affect. His behavior is normal. Judgment and thought content normal.      Assessment and Plan  Nursing note and vitals reviewed.

## 2015-07-07 NOTE — Discharge Instructions (Signed)
Angiogram An angiogram, also called angiography, is a procedure used to look at the blood vessels. In this procedure, dye is injected through a long, thin tube (catheter) into an artery. X-rays are then taken. The X-rays will show if there is a blockage or problem in a blood vessel.  LET [facility] CARE PROVIDER KNOW ABOUT:  Any allergies you have, including allergies to shellfish or contrast dye.   All medicines you are taking, including vitamins, herbs, eye drops, creams, and over-the-counter medicines.   Previous problems you or members of your family have had with the use of anesthetics.   Any blood disorders you have.   Previous surgeries you have had.  Any previous kidney problems or failure you have had.  Medical conditions you have.   Possibility of pregnancy, if this applies. RISKS AND COMPLICATIONS Generally, an angiogram is a safe procedure. However, as with any procedure, problems can occur. Possible problems include:  Injury to the blood vessels, including rupture or bleeding.  Infection or bruising at the catheter site.  Allergic reaction to the dye or contrast used.  Kidney damage from the dye or contrast used.  Blood clots that can lead to a stroke or heart attack. BEFORE THE PROCEDURE  Do not eat or drink after midnight on the night before the procedure, or as directed by your health care provider.   Ask your health care provider if you may drink enough water to take any needed medicines the morning of the procedure.  PROCEDURE  You may be given a medicine to help you relax (sedative) before and during the procedure. This medicine is given through an IV access tube that is inserted into one of your veins.   The area where the catheter will be inserted will be washed and shaved. This is usually done in the groin but may be done in the fold of your arm (near your elbow) or in the wrist.  A medicine will be given to numb the area where the catheter  will be inserted (local anesthetic).  The catheter will be inserted with a guide wire into an artery. The catheter is guided by using a type of X-ray (fluoroscopy) to the blood vessel being examined.   Dye is then injected into the catheter, and X-rays are taken. The dye helps to show where any narrowing or blockages are located.  AFTER THE PROCEDURE   If the procedure is done through the leg, you will be kept in bed lying flat for several hours. You will be instructed to not bend or cross your legs.  The insertion site will be checked frequently.  The pulse in your feet or wrist will be checked frequently.  Additional blood tests, X-rays, and electrocardiography may be done.   You may need to stay in the hospital overnight for observation.    This information is not intended to replace advice given to you by your health care provider. Make sure you discuss any questions you have with your health care provider.   Document Released: 05/11/2005 Document Revised: 08/22/2014 Document Reviewed: 01/02/2013 Elsevier Interactive Patient Education 2016 [place] An angiogram is an X-ray test. It is used to look at your blood vessels. For this test, a dye is put into the blood vessel being checked. The dye shows up on X-rays. It helps your doctor see if there is a blockage or other problem in the blood vessel. BEFORE THE PROCEDURE  Follow your doctor's instructions about limiting what you  eat or drink.  Ask your doctor if you may drink enough water to take any needed medicines the morning of the test.  Plan to have someone take you home after the test.  If you go home the same day as the test, plan to have someone stay with you for 24 hours. PROCEDURE   An IV tube will be put into one of your veins.  You will be given a medicine that makes you relax (sedative).  Your skin will be washed and shaved where the thin tube (catheter) will be inserted. This will usually be  done in the upper part of your leg (groin). It may also be done in your arm near the elbow or in your wrist.  You will be given a medicine that numbs the area where the tube will be inserted (local anesthetic).  The tube will be inserted into a blood vessel.  Using a type of X-ray (fluoroscopy) to see, your doctor will move the tube into the blood vessel to check it.  Dye will be put in through the tube. X-rays of your blood vessels will then be taken. Different health care providers and hospitals may do this procedure differently. AFTER THE PROCEDURE   If the test is done through the leg, you will be kept in bed lying flat for several hours. You will be told to not bend or cross your legs.   The area where the tube was inserted will be checked often.   The pulse in your feet or wrist will be checked often.   More tests or X-rays may be done.    This information is not intended to replace advice given to you by your health care provider. Make sure you discuss any questions you have with your health care provider.   Document Released: 10/28/2008 Document Revised: 08/22/2014 Document Reviewed: 01/02/2013 Elsevier Interactive Patient Education 2016 [place] An angiogram is an X-ray test. It is used to look at your blood vessels. For this test, a dye is put into the blood vessel being checked. The dye shows up on X-rays. It helps your doctor see if there is a blockage or other problem in the blood vessel. BEFORE THE PROCEDURE  Follow your doctor's instructions about limiting what you eat or drink.  Ask your doctor if you may drink enough water to take any needed medicines the morning of the test.  Plan to have someone take you home after the test.  If you go home the same day as the test, plan to have someone stay with you for 24 hours. PROCEDURE   An IV tube will be put into one of your veins.  You will be given a medicine that makes you relax  (sedative).  Your skin will be washed and shaved where the thin tube (catheter) will be inserted. This will usually be done in the upper part of your leg (groin). It may also be done in your arm near the elbow or in your wrist.  You will be given a medicine that numbs the area where the tube will be inserted (local anesthetic).  The tube will be inserted into a blood vessel.  Using a type of X-ray (fluoroscopy) to see, your doctor will move the tube into the blood vessel to check it.  Dye will be put in through the tube. X-rays of your blood vessels will then be taken. Different health care providers and hospitals may do this procedure differently. AFTER THE PROCEDURE  If the test is done through the leg, you will be kept in bed lying flat for several hours. You will be told to not bend or cross your legs.   The area where the tube was inserted will be checked often.   The pulse in your feet or wrist will be checked often.   More tests or X-rays may be done.    This information is not intended to replace advice given to you by your health care provider. Make sure you discuss any questions you have with your health care provider.   Document Released: 10/28/2008 Document Revised: 08/22/2014 Document Reviewed: 01/02/2013 Elsevier Interactive Patient Education 2016 [place]. Groin Insertion Instructions-If you lose feeling or develop tingling or pain in your leg or foot after the procedure, please walk around first.  If the discomfort does not improve , contact your physician and proceed to the nearest emergency room.  Loss of feeling in your leg might mean that a blockage has formed in the artery and this can be appropriately treated.  Limit your activity for the next two days after your procedure.  Avoid stooping, bending, heavy lifting or exertion as this may put pressure on the insertion site.  Resume normal activities in 48 hours.  You may shower after 24 hours but avoid  excessive warm water and do not scrub the site.  Remove clear dressing in 48 hours.  If you have had a closure device inserted, do not soak in a tub bath or a hot tub for at least one week.  No driving for 48 hours after discharge.  After the procedure, check the insertion site occasionally.  If any oozing occurs or there is apparent swelling, firm pressure over the site will prevent a bruise from forming.  You can not hurt anything by pressing directly on the site.  The pressure stops the bleeding by allowing a small clot to form.  If the bleeding continues after the pressure has been applied for more than 15 minutes, call 911 or go to the nearest emergency room.    The x-ray dye causes you to pass a considerate amount of urine.  For this reason, you will be asked to drink plenty of liquids after the procedure to prevent dehydration.  You may resume you regular diet.  Avoid caffeine products.    For pain at the site of your procedure, take non-aspirin medicines such as Tylenol.  Medications: A. Hold Metformin for 48 hours if applicable.  B. Continue taking all your present medications at home unless your doctor prescribes any changes.

## 2015-07-07 NOTE — Assessment & Plan Note (Signed)
Cholesterol closer to goal, continue vytorin as tolerated

## 2015-07-07 NOTE — Patient Instructions (Addendum)
You are having sx concerning for angina We will schedule a cardiac cath today at noon We will draw labs today and need a chest XRAY  Please call us if you have new issues that need to be addressed before your next appt.  Your physician wants you to follow-up in: 2 weeks   [facility] Cardiac Cath Instructions   You are scheduled for a Cardiac Cath on:___TODAY @ 12:30____  Please arrive at __11:30___am on the day of your procedure  You will need to pre-register prior to the day of your procedure.  Enter through the [employer] at [facility].  Registration is the first desk on your right.  Please take the procedure order we have given you in order to be registered appropriately  Do not eat/drink anything after midnight  Someone will need to drive you home  It is recommended someone be with you for the first 24 hours after your procedure  Wear clothes that are easy to get on/off and wear slip on shoes if possible   Medications bring a current list of all medications with you  Day of your procedure: Arrive at the [facility] entrance.  Free valet service is available.  After entering the [facility] please check-in at the registration desk (1st desk on your right) to receive your armband. After receiving your armband someone will escort you to the cardiac cath/special procedures waiting area.  The usual length of stay after your procedure is about 2 to 3 hours.  This can vary.  If you have any questions, please call our office at [phone], or you may call the cardiac cath lab at [facility] directly at [phone] Angiogram An angiogram, also called angiography, is a procedure used to look at the blood vessels. In this procedure, dye is injected through a long, thin tube (catheter) into an artery. X-rays are then taken. The X-rays will show if there is a blockage or problem in a blood vessel.  LET [facility] CARE PROVIDER KNOW ABOUT:  Any allergies you have, including allergies to shellfish or  contrast dye.   All medicines you are taking, including vitamins, herbs, eye drops, creams, and over-the-counter medicines.   Previous problems you or members of your family have had with the use of anesthetics.   Any blood disorders you have.   Previous surgeries you have had.  Any previous kidney problems or failure you have had.  Medical conditions you have.   Possibility of pregnancy, if this applies. RISKS AND COMPLICATIONS Generally, an angiogram is a safe procedure. However, as with any procedure, problems can occur. Possible problems include:  Injury to the blood vessels, including rupture or bleeding.  Infection or bruising at the catheter site.  Allergic reaction to the dye or contrast used.  Kidney damage from the dye or contrast used.  Blood clots that can lead to a stroke or heart attack. BEFORE THE PROCEDURE  Do not eat or drink after midnight on the night before the procedure, or as directed by your health care provider.   Ask your health care provider if you may drink enough water to take any needed medicines the morning of the procedure.  PROCEDURE  You may be given a medicine to help you relax (sedative) before and during the procedure. This medicine is given through an IV access tube that is inserted into one of your veins.   The area where the catheter will be inserted will be washed and shaved. This is usually done in the  groin but may be done in the fold of your arm (near your elbow) or in the wrist.  A medicine will be given to numb the area where the catheter will be inserted (local anesthetic).  The catheter will be inserted with a guide wire into an artery. The catheter is guided by using a type of X-ray (fluoroscopy) to the blood vessel being examined.   Dye is then injected into the catheter, and X-rays are taken. The dye helps to show where any narrowing or blockages are located.  AFTER THE PROCEDURE   If the procedure is done  through the leg, you will be kept in bed lying flat for several hours. You will be instructed to not bend or cross your legs.  The insertion site will be checked frequently.  The pulse in your feet or wrist will be checked frequently.  Additional blood tests, X-rays, and electrocardiography may be done.   You may need to stay in the hospital overnight for observation.    This information is not intended to replace advice given to you by your health care provider. Make sure you discuss any questions you have with your health care provider.   Document Released: 05/11/2005 Document Revised: 08/22/2014 Document Reviewed: 01/02/2013 Elsevier Interactive Patient Education 2016 [place] After Refer to this sheet in the next few weeks. These instructions provide you with information about caring for yourself after your procedure. Your health care provider may also give you more specific instructions. Your treatment has been planned according to current medical practices, but problems sometimes occur. Call your health care provider if you have any problems or questions after your procedure. WHAT TO EXPECT AFTER THE PROCEDURE After your procedure, it is typical to have the following:  Bruising at the catheter insertion site that usually fades within 1-2 weeks.  Blood collecting in the tissue (hematoma) that may be painful to the touch. It should usually decrease in size and tenderness within 1-2 weeks. HOME CARE INSTRUCTIONS  Take medicines only as directed by your health care provider.  You may shower 24-48 hours after the procedure or as directed by your health care provider. Remove the bandage (dressing) and gently wash the site with plain soap and water. Pat the area dry with a clean towel. Do not rub the site, because this may cause bleeding.  Do not take baths, swim, or use a hot tub until your health care provider approves.  Check your insertion site every day for  redness, swelling, or drainage.  Do not apply powder or lotion to the site.  Do not lift over 10 lb (4.5 kg) for 5 days after your procedure or as directed by your health care provider.  Ask your health care provider when it is okay to:  Return to work or school.  Resume usual physical activities or sports.  Resume sexual activity.  Do not drive home if you are discharged the same day as the procedure. Have someone else drive you.  You may drive 24 hours after the procedure unless otherwise instructed by your health care provider.  Do not operate machinery or power tools for 24 hours after the procedure or as directed by your health care provider.  If your procedure was done as an outpatient procedure, which means that you went home the same day as your procedure, a responsible adult should be with you for the first 24 hours after you arrive home.  Keep all follow-up visits as directed by your  health care provider. This is important. SEEK MEDICAL CARE IF:  You have a fever.  You have chills.  You have increased bleeding from the catheter insertion site. Hold pressure on the site. SEEK IMMEDIATE MEDICAL CARE IF:  You have unusual pain at the catheter insertion site.  You have redness, warmth, or swelling at the catheter insertion site.  You have drainage (other than a small amount of blood on the dressing) from the catheter insertion site.  The catheter insertion site is bleeding, and the bleeding does not stop after 30 minutes of holding steady pressure on the site.  The area near or just beyond the catheter insertion site becomes pale, cool, tingly, or numb.   This information is not intended to replace advice given to you by your health care provider. Make sure you discuss any questions you have with your health care provider.   Document Released: 02/17/2005 Document Revised: 08/22/2014 Document Reviewed: 01/02/2013 Elsevier Interactive Patient Education [employer].

## 2015-07-07 NOTE — Telephone Encounter (Signed)
Pt is calling stating he had some chest pains Sunday morning and then again yesterday  Pt c/o of Chest Pain: STAT if CP now or developed within 24 hours  1. Are you having CP right now?  Sunday and yesterday morning   2. Are you experiencing any other symptoms (ex. SOB, nausea, vomiting, sweating)? Sunday morning he had broke out in sweat, arm and chest hurt.   3. How long have you been experiencing CP? About 2 days  4. Is your CP continuous or coming and going? Not right now, but not sure. If he should go hospital or not.   5. Have you taken Nitroglycerin? Took 2 nitro Sunday and then did not take any yesterday  ?

## 2015-07-07 NOTE — Assessment & Plan Note (Signed)
Blood pressure is well controlled on today's visit. No changes made to the medications. 

## 2015-07-08 ENCOUNTER — Telehealth: Payer: Self-pay | Admitting: Cardiovascular Disease

## 2015-07-08 ENCOUNTER — Encounter: Payer: Self-pay | Admitting: Cardiovascular Disease

## 2015-07-08 NOTE — Telephone Encounter (Signed)
Patient had cath yesterday.  Symptoms started about 8 am.  Sitting in chair and noticed burning pain in R Leg in calf and feels a knot not hot to touch no redness   Denies sob   Patient says he has a hx of blood clots .  Per Gollan/Mandi told patient to come to office for quick check.  Patient advised [facility] and have volunteer wheel down to clinic.  Told patient Do not walk in !

## 2015-07-15 ENCOUNTER — Encounter: Payer: Self-pay | Admitting: Cardiovascular Disease

## 2015-07-15 ENCOUNTER — Ambulatory Visit (INDEPENDENT_AMBULATORY_CARE_PROVIDER_SITE_OTHER): Payer: PPO | Admitting: Cardiovascular Disease

## 2015-07-15 VITALS — BP 120/76 | HR 99 | Ht 73.0 in | Wt 223.0 lb

## 2015-07-15 DIAGNOSIS — I200 Unstable angina: Secondary | ICD-10-CM | POA: Diagnosis not present

## 2015-07-15 DIAGNOSIS — Z9889 Other specified postprocedural states: Secondary | ICD-10-CM

## 2015-07-15 DIAGNOSIS — I10 Essential (primary) hypertension: Secondary | ICD-10-CM | POA: Diagnosis not present

## 2015-07-15 DIAGNOSIS — E785 Hyperlipidemia, unspecified: Secondary | ICD-10-CM

## 2015-07-15 DIAGNOSIS — I25810 Atherosclerosis of coronary artery bypass graft(s) without angina pectoris: Secondary | ICD-10-CM

## 2015-07-15 MED ORDER — EZETIMIBE 10 MG PO TABS: 10.0000 mg | ORAL_TABLET | Freq: Every day | ORAL | Status: DC

## 2015-07-15 MED ORDER — SIMVASTATIN 40 MG PO TABS: 40.0000 mg | ORAL_TABLET | Freq: Every day | ORAL | Status: DC

## 2015-07-15 NOTE — Assessment & Plan Note (Signed)
Symptoms are better on isosorbide Cardiac catheterization results discussed

## 2015-07-15 NOTE — Patient Instructions (Addendum)
You are doing well.   We will change VYTORIN to: Zetia and simvastatin (Same thing) Price may be better  Please call us if you have new issues that need to be addressed before your next appt.  Your physician wants you to follow-up in: 6 months.  You will receive a reminder letter in the mail two months in advance. If you don't receive a letter, please call our office to schedule the follow-up appointment.

## 2015-07-15 NOTE — Progress Notes (Signed)
Patient ID: [name], male    DOB: 29-Mar-1941, 74 y.o.   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 He has a AAA less than 3 cm, mild bilateral carotid arterial disease estimated at 40-50% bilaterally. Numerous previous episode of chest pain relieved with belching consistent with GI etiology. Prior stress test December 2014 showing no ischemia He has a diagnosis of obstructive sleep apnea. Could not handle CPAP despite numerous attempts. Symptoms better after weight loss total hip replacements x2 He presents today for follow-up of his coronary artery disease, PAD Recent cardiac catheterization 07/07/2015  This showed:     Ost LM to LM lesion, 50% stenosed.     Ost RCA lesion, 80% stenosed.     Prox RCA-1 lesion, 80% stenosed.     Prox RCA-2 lesion, 80% stenosed.     LIMA was injected is moderate in size, and is anatomically normal.     Dist LAD lesion, 60% stenosed.     Ost LAD to Prox LAD lesion, 100% stenosed.     Mid LAD lesion, 60% stenosed.     The left ventricular systolic function is normal.     SVG was injected is normal in caliber, and is anatomically normal.     2nd Diag lesion, 50% stenosed.   severe RCA disease (small vessel) not amenable to PCI, moderate left main disease, moderate LAD and proximal diagonal disease. --Patent grafts x2 --No intervention performed, started on isosorbide 30 mg daily  On the isosorbide he reports having significantly improved chest discomfort, feels better He did have pain in his right calf, etiology and uncertain. Was seen in clinic last week, no significant knot. Marlowe Kays feels well, [place] the mountains [place]  Other past medical history   went to the emergency room in [facility] 07/03/2013  with chest pain. Had a CT scan of his chest which was essentially benign. Nitroglycerin did not help his pain. He ruled out and was sent home.  Repeat episode of chest pain 08/05/2013 with stress test at that time showing no significant ischemia, ejection fraction 48%. It was felt his chest pain was atypical in nature. She feels it was from GI etiology given his negative stress test.  He does report having some cold induced chest tightness/shortness of breath, asthma.    episode of chest pain 05/14/2013. He felt was indigestion. He took several nitroglycerin with no significant improvement in his symptoms. Symptoms were severe, felt similar to his previous MI. He got in his car and on the way, had a big burp. Symptoms seemed to resolve at that time. He continued on to the emergency room. There cardiac enzymes were negative x2, EKG unchanged. He reports having had prior chest pain episode relieved with belching in the past.   Allergies  Allergen Reactions  . Morphine   . Rofecoxib     Outpatient Encounter Prescriptions as of 07/15/2015  Medication Sig  . aspirin 81 MG tablet Take 81 mg by mouth daily.   . finasteride (PROSCAR) 5 MG tablet Take 5 mg by mouth daily.    . isosorbide mononitrate (IMDUR) 30 MG 24 hr tablet Take 1 tablet (30 mg total) by mouth daily.  . nitroGLYCERIN (NITROSTAT) 0.4 MG SL tablet Place 0.4 mg under the tongue every 5 (five) minutes as  needed for chest pain.  Marland Kitchen omeprazole (PRILOSEC) 40 MG capsule Take 40 mg by mouth daily.  . solifenacin (VESICARE) 5 MG tablet Take 5 mg by mouth daily.  . Testosterone (ANDROGEL) 20.25 MG/1.25GM (1.62%) GEL Place onto the skin every morning.  . triamcinolone cream (KENALOG) 0.1 % Apply 1 application topically 2 (two) times daily. (Patient taking differently: Apply 1 application topically 2 (two) times daily as needed. )  . [DISCONTINUED] ezetimibe-simvastatin (VYTORIN) 10-40 MG per tablet Take 1 tablet by mouth at bedtime. Monday  Wednesday and Friday  . ezetimibe (ZETIA) 10 MG tablet Take 1 tablet (10 mg total) by mouth daily.  . simvastatin (ZOCOR) 40 MG tablet Take 1 tablet (40 mg total) by mouth at bedtime.   No facility-administered encounter medications on file as of 07/15/2015.    Past Medical History  Diagnosis Date  . HYPERLIPIDEMIA 04/26/2007  . HYPERTENSION 04/30/2007  . 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 GERD 04/30/2007  . INGUINAL HERNIA, RIGHT 05/26/2010  . BENIGN PROSTATIC HYPERTROPHY, WITH URINARY OBSTRUCTION 09/06/2007  . 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 LOC OSTEOARTHROS NOT SPEC PRIM/SEC LOWER LEG 11/05/2009  . OSTEOARTHRITIS 04/26/2007  . 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 PSA, INCREASED 07/09/2008  . DVT, HX OF 1998  . Lumbar disc disease with radiculopathy   . Spinal stenosis of lumbar region   . History of shingles   . Pneumonia 07/2014    [facility] hospitalization    Past Surgical History  Procedure Laterality Date  . Appendectomy      rupture  . Coronary artery bypass graft  1990    3 vessel   . Cataract extraction, bilateral    . Total hip arthroplasty Bilateral 1999,2000  . Spine surgery  1984    lumbar for two ruptured discs/fusion  . Knee arthroscopy      x2  . Cystoscopy  12/23/10    [facility]  . Colonoscopy  03/2010    HP polyp, diverticulosis, rpt 10 yrs ([name])  . Cardiac catheterization [name] 07/07/2015    Procedure: Left Heart Cath and Cors/Grafts Angiography;  Surgeon: [name], MD;  Location: [place] [facility];  Service: Cardiovascular;  Laterality: [name];    Social History  reports that he quit smoking about 26 years ago. His smoking use included Cigarettes. He has a 25 pack-year smoking history. He has never used smokeless tobacco. He reports that he does not drink alcohol or use illicit drugs.  Family History family history includes Cancer in his  other and sister; Diabetes in his brother and mother; Heart attack in his mother; Heart disease in his father; Kidney disease in his father and sister; Stroke in his father and mother.  Review of Systems  Constitutional:       Sweating  Respiratory: Positive for chest tightness and shortness of breath.   Cardiovascular: Positive for chest pain and palpitations.  Gastrointestinal: Negative.   Musculoskeletal: Positive for arthralgias.  Skin: Negative.   Neurological: Negative.   Hematological: Negative.   Psychiatric/Behavioral: Negative.   All other systems reviewed and are negative.   BP 120/76 mmHg  Pulse 99  Ht 6\' 1"  (1.854 m)  Wt 223 lb (101.152 kg)  BMI 29.43 kg/m2  Physical Exam  Constitutional: He is oriented to person, place, and time. He appears well-developed and well-nourished.  HENT:  Head: Normocephalic.  Nose: Nose normal.  Mouth/Throat: Oropharynx is  clear and moist.  Eyes: Conjunctivae are normal. Pupils are equal, round, and reactive to light.  Neck: Normal range of motion. Neck supple. No JVD present.  Cardiovascular: Normal rate, regular rhythm, S1 normal, S2 normal, normal heart sounds and intact distal pulses.  Exam reveals no gallop and no friction rub.   No murmur heard. Pulmonary/Chest: Effort normal and breath sounds normal. No respiratory distress. He has no wheezes. He has no rales. He exhibits no tenderness.  Abdominal: Soft. Bowel sounds are normal. He exhibits no distension. There is no tenderness.  Musculoskeletal: Normal range of motion. He exhibits no edema or tenderness.  Lymphadenopathy:    He has no cervical adenopathy.  Neurological: He is alert and oriented to person, place, and time. Coordination normal.  Skin: Skin is warm and dry. No rash noted. No erythema.  Psychiatric: He has a normal mood and affect. His behavior is normal. Judgment and thought content normal.      Assessment and Plan   Nursing note and vitals reviewed.

## 2015-07-15 NOTE — Assessment & Plan Note (Signed)
Results as above, stress importance of aggressive cholesterol management

## 2015-07-15 NOTE — Assessment & Plan Note (Signed)
Recommended we split the vytorin into simvastatin 40 mg and Zetia for cost reasons

## 2015-08-16 LAB — HM DIABETES EYE EXAM

## 2015-08-28 DIAGNOSIS — E291 Testicular hypofunction: Secondary | ICD-10-CM | POA: Diagnosis not present

## 2015-09-08 DIAGNOSIS — K219 Gastro-esophageal reflux disease without esophagitis: Secondary | ICD-10-CM | POA: Diagnosis not present

## 2015-09-17 DIAGNOSIS — L570 Actinic keratosis: Secondary | ICD-10-CM | POA: Diagnosis not present

## 2015-09-17 DIAGNOSIS — L578 Other skin changes due to chronic exposure to nonionizing radiation: Secondary | ICD-10-CM | POA: Diagnosis not present

## 2015-10-23 DIAGNOSIS — N401 Enlarged prostate with lower urinary tract symptoms: Secondary | ICD-10-CM | POA: Diagnosis not present

## 2015-10-23 DIAGNOSIS — E291 Testicular hypofunction: Secondary | ICD-10-CM | POA: Diagnosis not present

## 2015-10-23 DIAGNOSIS — H26492 Other secondary cataract, left eye: Secondary | ICD-10-CM | POA: Diagnosis not present

## 2015-10-23 DIAGNOSIS — N138 Other obstructive and reflux uropathy: Secondary | ICD-10-CM | POA: Diagnosis not present

## 2015-11-02 ENCOUNTER — Telehealth: Payer: Self-pay | Admitting: Cardiovascular Disease

## 2015-11-02 NOTE — Telephone Encounter (Signed)
Pt called states that his heart is "skipping beats", states he is weak and his hands are swelling. States he is also having headaches. States this has been going on  For 2 days. BP was 140/70 yesterday, HR 60s. Please call. States it is "skips every 6-7 beats"

## 2015-11-02 NOTE — Telephone Encounter (Signed)
This encounter was created in error - please disregard.

## 2015-11-02 NOTE — Telephone Encounter (Signed)
Patient states that his heart has been skipping beats just about all of the time. He also reported that when he gets up in the morning that he has some swelling to his hands and he didn't know if that was from some of his medications or not. He stated that his wife was really concerned because of the skipped beats and that he was not feeling well. After talking with him we were able to get him an appointment on 11/04/2015 at  3:40 pm to come in and see Dr. Rockey Situ. Let him know that if he had any problems before this appointment to please call us back. He agreed with coming in to be seen and had no further questions at this time.

## 2015-11-03 DIAGNOSIS — Z0000 Encounter for general adult medical examination without abnormal findings: Secondary | ICD-10-CM | POA: Diagnosis not present

## 2015-11-03 DIAGNOSIS — R3915 Urgency of urination: Secondary | ICD-10-CM | POA: Diagnosis not present

## 2015-11-03 DIAGNOSIS — N401 Enlarged prostate with lower urinary tract symptoms: Secondary | ICD-10-CM | POA: Diagnosis not present

## 2015-11-03 DIAGNOSIS — N138 Other obstructive and reflux uropathy: Secondary | ICD-10-CM | POA: Diagnosis not present

## 2015-11-03 DIAGNOSIS — E291 Testicular hypofunction: Secondary | ICD-10-CM | POA: Diagnosis not present

## 2015-11-04 ENCOUNTER — Ambulatory Visit: Payer: PPO | Admitting: Cardiovascular Disease

## 2015-11-05 ENCOUNTER — Ambulatory Visit (INDEPENDENT_AMBULATORY_CARE_PROVIDER_SITE_OTHER): Payer: PPO | Admitting: Cardiovascular Disease

## 2015-11-05 ENCOUNTER — Encounter: Payer: Self-pay | Admitting: Cardiovascular Disease

## 2015-11-05 VITALS — BP 132/70 | HR 91 | Ht 73.0 in | Wt 226.0 lb

## 2015-11-05 DIAGNOSIS — E785 Hyperlipidemia, unspecified: Secondary | ICD-10-CM

## 2015-11-05 DIAGNOSIS — I25810 Atherosclerosis of coronary artery bypass graft(s) without angina pectoris: Secondary | ICD-10-CM

## 2015-11-05 DIAGNOSIS — I499 Cardiac arrhythmia, unspecified: Secondary | ICD-10-CM

## 2015-11-05 DIAGNOSIS — I209 Angina pectoris, unspecified: Secondary | ICD-10-CM

## 2015-11-05 DIAGNOSIS — I10 Essential (primary) hypertension: Secondary | ICD-10-CM

## 2015-11-05 NOTE — Progress Notes (Signed)
Patient ID: [name], male    DOB: 09/06/1940, 75 y.o.   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    AAA less than 3 cm, mild bilateral carotid arterial disease estimated at 40-50% bilaterally. Numerous previous episode of chest pain relieved with belching consistent with GI etiology. Prior stress test December 2014 showing no ischemia He has a diagnosis of obstructive sleep apnea. Could not handle CPAP despite numerous attempts. Symptoms better after weight loss total hip replacements x2  Recent cardiac catheterization 07/07/2015     Ost LM to LM lesion, 50% stenosed.     Ost RCA lesion, 80% stenosed.     Prox RCA-1 lesion, 80% stenosed.     Prox RCA-2 lesion, 80% stenosed.     LIMA was injected is moderate in size, and is anatomically normal.     Dist LAD lesion, 60% stenosed.     Ost LAD to Prox LAD lesion, 100% stenosed.     Mid LAD lesion, 60% stenosed.     The left ventricular systolic function is normal.     SVG was injected is normal in caliber, and is anatomically normal.     2nd Diag lesion, 50% stenosed.   severe RCA disease (small vessel) not amenable to PCI, moderate left main disease, moderate LAD and proximal diagonal disease. --Patent grafts x2 --No intervention performed, started on isosorbide 30 mg daily  In follow-up today, he reports having swelling of his hands when he wakes up in the morning, worried about circulation There is a lump in his left brachial area that he is concerned about, has been there for years Recent episode of palpitations at nighttime, wife wanted him to get checked out today to make sure he was okay Also reports having recent headache, neck pain, blood pressure was very elevated. This occurred on  a Sunday Rarely has migraine headaches, none recently He continues to take vytorin  EKG on today's visit shows normal sinus rhythm with rate 91 bpm, PVCs noted, left anterior fascicular block  Other past medical history  went to the emergency room in [facility] 07/03/2013 with chest pain. Had a CT scan of his chest which was essentially benign. Nitroglycerin did not help his pain. He ruled out and was sent home.  Repeat episode of chest pain 08/05/2013 with stress test at that time showing no significant ischemia, ejection fraction 48%. It was felt his chest pain was atypical in nature. She feels it was from GI etiology given his negative stress test.  He does report having some cold induced chest tightness/shortness of breath, asthma.    episode of chest pain 05/14/2013. He felt was indigestion. He took several nitroglycerin with no significant improvement in his symptoms. Symptoms were severe, felt similar to his previous MI. He got in his car and on the way, had a big burp. Symptoms seemed to resolve at that time. He continued on to the emergency room. There cardiac enzymes were negative x2, EKG unchanged. He reports having had prior chest pain episode relieved with belching in the past.   Allergies  Allergen Reactions  . Morphine   . Rofecoxib     Outpatient Encounter Prescriptions as of 11/05/2015  Medication Sig  . aspirin 81 MG tablet Take 81 mg by mouth daily.   Marland Kitchen  ezetimibe-simvastatin (VYTORIN) 10-20 MG tablet Take 1 tablet by mouth daily.  . finasteride (PROSCAR) 5 MG tablet Take 5 mg by mouth daily.    . isosorbide mononitrate (IMDUR) 30 MG 24 hr tablet Take 1 tablet (30 mg total) by mouth daily.  . nitroGLYCERIN (NITROSTAT) 0.4 MG SL tablet Place 0.4 mg under the tongue every 5 (five) minutes as needed for chest pain.  . pantoprazole (PROTONIX) 40 MG tablet Take 40 mg by mouth daily.  . solifenacin (VESICARE) 10 MG tablet Take 10 mg by mouth daily.  . [DISCONTINUED] ezetimibe  (ZETIA) 10 MG tablet Take 1 tablet (10 mg total) by mouth daily. (Patient not taking: Reported on 11/05/2015)  . [DISCONTINUED] omeprazole (PRILOSEC) 40 MG capsule Take 40 mg by mouth daily. Reported on 11/05/2015  . [DISCONTINUED] simvastatin (ZOCOR) 40 MG tablet Take 1 tablet (40 mg total) by mouth at bedtime. (Patient not taking: Reported on 11/05/2015)  . [DISCONTINUED] solifenacin (VESICARE) 5 MG tablet Take 5 mg by mouth daily. Reported on 11/05/2015  . [DISCONTINUED] Testosterone (ANDROGEL) 20.25 MG/1.25GM (1.62%) GEL Place onto the skin every morning. Reported on 11/05/2015  . [DISCONTINUED] triamcinolone cream (KENALOG) 0.1 % Apply 1 application topically 2 (two) times daily. (Patient not taking: Reported on 11/05/2015)   No facility-administered encounter medications on file as of 11/05/2015.    Past Medical History  Diagnosis Date  . HYPERLIPIDEMIA 04/26/2007  . HYPERTENSION 04/30/2007  . 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 GERD 04/30/2007  . INGUINAL HERNIA, RIGHT 05/26/2010  . BENIGN PROSTATIC HYPERTROPHY, WITH URINARY OBSTRUCTION 09/06/2007  . 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 LOC OSTEOARTHROS NOT SPEC PRIM/SEC LOWER LEG 11/05/2009  . OSTEOARTHRITIS 04/26/2007  . 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 PSA, INCREASED 07/09/2008  . DVT, HX OF 1998  . Lumbar disc disease with radiculopathy   . Spinal stenosis of lumbar region   . History of shingles   . Pneumonia 07/2014    [facility] hospitalization    Past Surgical History  Procedure Laterality Date  . Appendectomy      rupture  . Coronary artery bypass graft  1990    3 vessel   . Cataract extraction, bilateral    . Total hip arthroplasty Bilateral 1999,2000  . Spine surgery  1984    lumbar for two ruptured discs/fusion  . Knee arthroscopy      x2  . Cystoscopy  12/23/10    [facility]  . Colonoscopy  03/2010    HP polyp,  diverticulosis, rpt 10 yrs ([name])  . Cardiac catheterization [name] 07/07/2015    Procedure: Left Heart Cath and Cors/Grafts Angiography;  Surgeon: [name], MD;  Location: [place] [facility];  Service: Cardiovascular;  Laterality: [name];    Social History  reports that he quit smoking about 27 years ago. His smoking use included Cigarettes. He has a 25 pack-year smoking history. He has never used smokeless tobacco. He reports that he does not drink alcohol or use illicit drugs.  Family History family history includes Cancer in his other and sister; Diabetes in his brother and mother; Heart attack in his mother; Heart disease in his father; Kidney disease in his father and sister; Stroke in his father and mother.  Review of Systems  Respiratory: Negative.   Cardiovascular: Positive for palpitations.  Gastrointestinal: Negative.   Musculoskeletal: Positive for arthralgias.  Skin: Negative.   Neurological: Negative.  Hematological: Negative.   Psychiatric/Behavioral: Negative.   All other systems reviewed and are negative.   BP 132/70 mmHg  Pulse 91  Ht 6\' 1"  (1.854 m)  Wt 226 lb (102.513 kg)  BMI 29.82 kg/m2  Physical Exam  Constitutional: He is oriented to person, place, and time. He appears well-developed and well-nourished.  Obese  HENT:  Head: Normocephalic.  Nose: Nose normal.  Mouth/Throat: Oropharynx is clear and moist.  Eyes: Conjunctivae are normal. Pupils are equal, round, and reactive to light.  Neck: Normal range of motion. Neck supple. No JVD present.  Cardiovascular: Normal rate, regular rhythm, S1 normal, S2 normal, normal heart sounds and intact distal pulses.  Exam reveals no gallop and no friction rub.   No murmur heard. Pulmonary/Chest: Effort normal and breath sounds normal. No respiratory distress. He has no wheezes. He has no rales. He exhibits no tenderness.  Abdominal: Soft. Bowel sounds are normal. He exhibits no distension. There is no  tenderness.  Musculoskeletal: Normal range of motion. He exhibits no edema or tenderness.  Lymphadenopathy:    He has no cervical adenopathy.  Neurological: He is alert and oriented to person, place, and time. Coordination normal.  Skin: Skin is warm and dry. No rash noted. No erythema.  Psychiatric: He has a normal mood and affect. His behavior is normal. Judgment and thought content normal.      Assessment and Plan   Nursing note and vitals reviewed.

## 2015-11-05 NOTE — Assessment & Plan Note (Signed)
Cholesterol slightly above goal. For now would continue on vytorin, Would change to generic when available

## 2015-11-05 NOTE — Assessment & Plan Note (Signed)
Denies any recent episodes of chest pain, Would take nitroglycerin for any anginal symptoms

## 2015-11-05 NOTE — Assessment & Plan Note (Signed)
Blood pressure well controlled on today's visit, no changes to his medications Recommended if blood pressure spikes, he take nitroglycerin sublingual. If blood pressure stays persistently elevated, could take extra isosorbide

## 2015-11-05 NOTE — Assessment & Plan Note (Signed)
Recent cardiac catheterization. Medical management recommended

## 2015-11-05 NOTE — Patient Instructions (Signed)
You are doing well.  If you have elevated blood pressure, Take additional isosorbide  If you have more palpitations, Call the office, we could start a low dose b-blocker   Please call us if you have new issues that need to be addressed before your next appt.  Your physician wants you to follow-up in: 6 months.  You will receive a reminder letter in the mail two months in advance. If you don't receive a letter, please call our office to schedule the follow-up appointment.

## 2016-01-01 ENCOUNTER — Ambulatory Visit (INDEPENDENT_AMBULATORY_CARE_PROVIDER_SITE_OTHER): Payer: PPO | Admitting: Family Medicine

## 2016-01-01 ENCOUNTER — Encounter: Payer: Self-pay | Admitting: Family Medicine

## 2016-01-01 VITALS — BP 134/68 | HR 69 | Temp 97.5°F | Wt 226.0 lb

## 2016-01-01 DIAGNOSIS — E119 Type 2 diabetes mellitus without complications: Secondary | ICD-10-CM

## 2016-01-01 DIAGNOSIS — E785 Hyperlipidemia, unspecified: Secondary | ICD-10-CM

## 2016-01-01 DIAGNOSIS — I10 Essential (primary) hypertension: Secondary | ICD-10-CM

## 2016-01-01 DIAGNOSIS — I739 Peripheral vascular disease, unspecified: Secondary | ICD-10-CM

## 2016-01-01 DIAGNOSIS — E538 Deficiency of other specified B group vitamins: Secondary | ICD-10-CM | POA: Diagnosis not present

## 2016-01-01 DIAGNOSIS — M1990 Unspecified osteoarthritis, unspecified site: Secondary | ICD-10-CM

## 2016-01-01 DIAGNOSIS — I25810 Atherosclerosis of coronary artery bypass graft(s) without angina pectoris: Secondary | ICD-10-CM

## 2016-01-01 LAB — BASIC METABOLIC PANEL
BUN: 18 mg/dL (ref 6–23)
CO2: 29 mEq/L (ref 19–32)
Calcium: 8.9 mg/dL (ref 8.4–10.5)
Chloride: 106 mEq/L (ref 96–112)
Creatinine, Ser: 1.27 mg/dL (ref 0.40–1.50)
GFR: 58.8 mL/min — ABNORMAL LOW (ref >60.00)
Glucose, Bld: 118 mg/dL — ABNORMAL HIGH (ref 70–99)
Potassium: 4.4 mEq/L (ref 3.5–5.1)
Sodium: 139 mEq/L (ref 135–145)

## 2016-01-01 LAB — HEMOGLOBIN A1C: Hgb A1c MFr Bld: 6.8 % — ABNORMAL HIGH (ref 4.6–6.5)

## 2016-01-01 LAB — MICROALBUMIN / CREATININE URINE RATIO
Creatinine,U: 167.8 mg/dL
Microalb Creat Ratio: 0.4 mg/g (ref 0.0–30.0)
Microalb, Ur: <0.7 mg/dL (ref 0.0–1.9)

## 2016-01-01 LAB — LDL CHOLESTEROL, DIRECT: Direct LDL: 106 mg/dL

## 2016-01-01 MED ORDER — CYANOCOBALAMIN 1000 MCG/ML IJ SOLN
1000.0000 ug | Freq: Once | INTRAMUSCULAR | Status: AC
  Administered 2016-01-01: 1000 ug via INTRAMUSCULAR

## 2016-01-01 MED ORDER — VITAMIN B-12 1000 MCG PO TABS: 1000.0000 ug | ORAL_TABLET | Freq: Every day | ORAL | Status: DC

## 2016-01-01 NOTE — Assessment & Plan Note (Signed)
On aspirin and imdur.

## 2016-01-01 NOTE — Assessment & Plan Note (Addendum)
Check d LDL today. Continue vytorin.

## 2016-01-01 NOTE — Assessment & Plan Note (Addendum)
Good control today. Continue imdur.

## 2016-01-01 NOTE — Assessment & Plan Note (Signed)
Anticipate today's pain more from OA than claudication. Some diminished pulses on left today. Continue aspirin, statin.

## 2016-01-01 NOTE — Progress Notes (Signed)
BP 134/68 mmHg  Pulse 69  Temp(Src) 97.5 F (36.4 C) (Oral)  Wt 226 lb (102.513 kg)  SpO2 92%   CC: f/u visit, leg pain Subjective:    Patient ID: [name], male    DOB: 30-Nov-1940, 75 y.o.   MRN: [id number]  HPI: [name] is a 75 y.o. male presenting on 01/01/2016 for Follow-up and Leg Pain   DM - sugars creeping up. Due for labs today. DM eye exam - 08/2015 ([facility] at [facility]). Endorses occasional claudication.  Lab Results  Component Value Date   HGBA1C 6.5 06/29/2015    Diabetic Foot Exam - Simple   Simple Foot Form  Diabetic Foot exam was performed with the following findings:  Yes 01/01/2016  8:28 AM  Visual Inspection  No deformities, no ulcerations, no other skin breakdown bilaterally:  Yes  Sensation Testing  Intact to touch and monofilament testing bilaterally:  Yes  Pulse Check  See comments:  Yes  Comments  LLE diminished pulses, 1+ DP on right      CAD - followed by cardiology. Recently started on imdur.   Bilateral leg pain - points to anterior knees R>L. S/p cortisone shots in knees and hip. No redness or warmth of joints. Also noticing some elbow pain. No calf pain with walking.   Relevant past medical, surgical, family and social history reviewed and updated as indicated. Interim medical history since our last visit reviewed. Allergies and medications reviewed and updated. Current Outpatient Prescriptions on File Prior to Visit  Medication Sig  . aspirin 81 MG tablet Take 81 mg by mouth daily.   Marland Kitchen ezetimibe-simvastatin (VYTORIN) 10-20 MG tablet Take 1 tablet by mouth daily.  . finasteride (PROSCAR) 5 MG tablet Take 5 mg by mouth daily.    . isosorbide mononitrate (IMDUR) 30 MG 24 hr tablet Take 1 tablet (30 mg total) by mouth daily.  . nitroGLYCERIN (NITROSTAT) 0.4 MG SL tablet Place 0.4 mg under the tongue every 5 (five) minutes as needed for chest pain.  . pantoprazole (PROTONIX) 40 MG tablet Take 40 mg by mouth daily.  .  solifenacin (VESICARE) 10 MG tablet Take 10 mg by mouth daily.   No current facility-administered medications on file prior to visit.    Review of Systems Per HPI unless specifically indicated in ROS section     Objective:    BP 134/68 mmHg  Pulse 69  Temp(Src) 97.5 F (36.4 C) (Oral)  Wt 226 lb (102.513 kg)  SpO2 92%  Wt Readings from Last 3 Encounters:  01/01/16 226 lb (102.513 kg)  11/05/15 226 lb (102.513 kg)  07/15/15 223 lb (101.152 kg)    Physical Exam  Constitutional: He appears well-developed and well-nourished. No distress.  HENT:  Head: Normocephalic and atraumatic.  Right Ear: External ear normal.  Left Ear: External ear normal.  Nose: Nose normal.  Mouth/Throat: Oropharynx is clear and moist. No oropharyngeal exudate.  Eyes: Conjunctivae and EOM are normal. Pupils are equal, round, and reactive to light. No scleral icterus.  Neck: Normal range of motion. Neck supple.  Cardiovascular: Normal rate, regular rhythm, normal heart sounds and intact distal pulses.   No murmur heard. Pulmonary/Chest: Effort normal and breath sounds normal. No respiratory distress. He has no wheezes. He has no rales.  Musculoskeletal: He exhibits no edema.  See HPI for foot exam if done  Lymphadenopathy:    He has no cervical adenopathy.  Skin: Skin is warm and dry. No rash noted.  Psychiatric: He has a normal mood and affect.  Nursing note and vitals reviewed.  Results for orders placed or performed in visit on 01/01/16  HM DIABETES EYE EXAM  Result Value Ref Range   HM Diabetic Eye Exam No Retinopathy No Retinopathy      Assessment & Plan:   Problem List Items Addressed This Visit    Hyperlipidemia    Check d LDL today. Continue vytorin.      Essential hypertension    Good control today. Continue imdur.       CAD, ARTERY BYPASS GRAFT    On aspirin and imdur.       PVD (peripheral vascular disease) with claudication ([place])    Anticipate today's pain more from OA  than claudication. Some diminished pulses on left today. Continue aspirin, statin.       Osteoarthritis    Diffuse OA. Encouraged he start scheduled tylenol 1000mg  bid. He does follow with ortho s/p knee and hip bursa injections.      Relevant Medications   acetaminophen (TYLENOL) 500 MG tablet   Diet-controlled diabetes mellitus ([place]) - Primary    Reviewed with patient. Foot exam today. States had eye exam 08/2015. Recheck A1c today as well as microalbumin.      Relevant Orders   Basic metabolic panel   Hemoglobin A1c   Microalbumin / creatinine urine ratio   LDL Cholesterol, Direct   Low vitamin B12 level    b12 shot today. Encouraged he start oral supplementation.           Follow up plan: Return in about 6 months (around 07/03/2016), or as needed, for follow up visit.  Ria Bush, MD

## 2016-01-01 NOTE — Assessment & Plan Note (Signed)
Diffuse OA. Encouraged he start scheduled tylenol 1000mg  bid. He does follow with ortho s/p knee and hip bursa injections.

## 2016-01-01 NOTE — Progress Notes (Signed)
Pre visit review using our clinic review tool, if applicable. No additional management support is needed unless otherwise documented below in the visit note. 

## 2016-01-01 NOTE — Assessment & Plan Note (Signed)
Reviewed with patient. Foot exam today. States had eye exam 08/2015. Recheck A1c today as well as microalbumin.

## 2016-01-01 NOTE — Addendum Note (Signed)
Addended by: Modena Nunnery on: 01/01/2016 09:01 AM   Modules accepted: Orders

## 2016-01-01 NOTE — Assessment & Plan Note (Signed)
b12 shot today. Encouraged he start oral supplementation.

## 2016-01-01 NOTE — Patient Instructions (Addendum)
You could try tylenol 1000mg  twice daily every day for arthritis pain.  Blood work today, urinalysis today. b12 shot today. Continue b12 tablets at home.  Return as needed or in 6 months for follow up visit.

## 2016-01-12 ENCOUNTER — Ambulatory Visit: Payer: PPO | Admitting: Cardiovascular Disease

## 2016-03-25 DIAGNOSIS — M4806 Spinal stenosis, lumbar region: Secondary | ICD-10-CM | POA: Diagnosis not present

## 2016-03-25 DIAGNOSIS — M1711 Unilateral primary osteoarthritis, right knee: Secondary | ICD-10-CM | POA: Diagnosis not present

## 2016-04-04 DIAGNOSIS — M4806 Spinal stenosis, lumbar region: Secondary | ICD-10-CM | POA: Diagnosis not present

## 2016-04-05 ENCOUNTER — Other Ambulatory Visit: Payer: Self-pay

## 2016-04-05 MED ORDER — ISOSORBIDE MONONITRATE ER 30 MG PO TB24: 30.0000 mg | ORAL_TABLET | Freq: Every day | ORAL | 3 refills | Status: DC

## 2016-04-11 DIAGNOSIS — M4806 Spinal stenosis, lumbar region: Secondary | ICD-10-CM | POA: Diagnosis not present

## 2016-04-13 ENCOUNTER — Other Ambulatory Visit: Payer: Self-pay | Admitting: Orthopedic Surgery

## 2016-04-13 DIAGNOSIS — M48062 Spinal stenosis, lumbar region with neurogenic claudication: Secondary | ICD-10-CM

## 2016-04-20 DIAGNOSIS — L570 Actinic keratosis: Secondary | ICD-10-CM | POA: Diagnosis not present

## 2016-04-20 DIAGNOSIS — L821 Other seborrheic keratosis: Secondary | ICD-10-CM | POA: Diagnosis not present

## 2016-04-20 DIAGNOSIS — L578 Other skin changes due to chronic exposure to nonionizing radiation: Secondary | ICD-10-CM | POA: Diagnosis not present

## 2016-04-20 DIAGNOSIS — L812 Freckles: Secondary | ICD-10-CM | POA: Diagnosis not present

## 2016-04-20 DIAGNOSIS — Z1283 Encounter for screening for malignant neoplasm of skin: Secondary | ICD-10-CM | POA: Diagnosis not present

## 2016-04-20 DIAGNOSIS — D692 Other nonthrombocytopenic purpura: Secondary | ICD-10-CM | POA: Diagnosis not present

## 2016-04-20 DIAGNOSIS — D229 Melanocytic nevi, unspecified: Secondary | ICD-10-CM | POA: Diagnosis not present

## 2016-04-20 DIAGNOSIS — D1800 Hemangioma unspecified site: Secondary | ICD-10-CM | POA: Diagnosis not present

## 2016-04-20 DIAGNOSIS — L820 Inflamed seborrheic keratosis: Secondary | ICD-10-CM | POA: Diagnosis not present

## 2016-04-21 ENCOUNTER — Other Ambulatory Visit: Payer: Self-pay | Admitting: Orthopedic Surgery

## 2016-04-21 ENCOUNTER — Ambulatory Visit
Admission: RE | Admit: 2016-04-21 | Discharge: 2016-04-21 | Disposition: A | Discharge status: Home / Self Care | Payer: PPO | Source: Ambulatory Visit | Attending: Orthopedic Surgery | Admitting: Orthopedic Surgery

## 2016-04-21 ENCOUNTER — Ambulatory Visit
Admission: RE | Admit: 2016-04-21 | Discharge: 2016-04-21 | Disposition: A | Discharge status: Home / Self Care | Payer: Self-pay | Source: Ambulatory Visit | Attending: Orthopedic Surgery | Admitting: Orthopedic Surgery

## 2016-04-21 DIAGNOSIS — M48061 Spinal stenosis, lumbar region without neurogenic claudication: Secondary | ICD-10-CM

## 2016-04-21 DIAGNOSIS — M4806 Spinal stenosis, lumbar region: Secondary | ICD-10-CM | POA: Diagnosis not present

## 2016-04-21 DIAGNOSIS — M48062 Spinal stenosis, lumbar region with neurogenic claudication: Secondary | ICD-10-CM

## 2016-04-21 MED ORDER — MEPERIDINE HCL 100 MG/ML IJ SOLN
75.0000 mg | Freq: Once | INTRAMUSCULAR | Status: AC
  Administered 2016-04-21: 75 mg via INTRAMUSCULAR

## 2016-04-21 MED ORDER — DIAZEPAM 5 MG PO TABS
5.0000 mg | ORAL_TABLET | Freq: Once | ORAL | Status: AC
  Administered 2016-04-21: 5 mg via ORAL

## 2016-04-21 MED ORDER — IOPAMIDOL (ISOVUE-M 200) INJECTION 41%
15.0000 mL | Freq: Once | INTRAMUSCULAR | Status: AC
  Administered 2016-04-21: 15 mL via INTRATHECAL

## 2016-04-21 MED ORDER — ONDANSETRON HCL 4 MG/2ML IJ SOLN
4.0000 mg | Freq: Once | INTRAMUSCULAR | Status: AC
  Administered 2016-04-21: 4 mg via INTRAMUSCULAR

## 2016-04-21 NOTE — Discharge Instructions (Signed)

## 2016-04-25 ENCOUNTER — Telehealth: Payer: Self-pay

## 2016-04-25 NOTE — Telephone Encounter (Signed)
Patient states he is doing well after his myelogram here 04/21/16.  Denies headache.  jkl

## 2016-04-29 DIAGNOSIS — M4806 Spinal stenosis, lumbar region: Secondary | ICD-10-CM | POA: Diagnosis not present

## 2016-05-03 ENCOUNTER — Ambulatory Visit (INDEPENDENT_AMBULATORY_CARE_PROVIDER_SITE_OTHER): Payer: PPO | Admitting: Cardiovascular Disease

## 2016-05-03 ENCOUNTER — Encounter: Payer: Self-pay | Admitting: Cardiovascular Disease

## 2016-05-03 VITALS — BP 138/68 | HR 83 | Ht 72.0 in | Wt 221.5 lb

## 2016-05-03 DIAGNOSIS — E785 Hyperlipidemia, unspecified: Secondary | ICD-10-CM | POA: Diagnosis not present

## 2016-05-03 DIAGNOSIS — I208 Other forms of angina pectoris: Secondary | ICD-10-CM

## 2016-05-03 DIAGNOSIS — Z01810 Encounter for preprocedural cardiovascular examination: Secondary | ICD-10-CM

## 2016-05-03 DIAGNOSIS — I25810 Atherosclerosis of coronary artery bypass graft(s) without angina pectoris: Secondary | ICD-10-CM | POA: Diagnosis not present

## 2016-05-03 DIAGNOSIS — I10 Essential (primary) hypertension: Secondary | ICD-10-CM | POA: Diagnosis not present

## 2016-05-03 MED ORDER — EZETIMIBE-SIMVASTATIN 10-40 MG PO TABS: 1.0000 | ORAL_TABLET | Freq: Every day | ORAL | 11 refills | Status: DC

## 2016-05-03 NOTE — Patient Instructions (Addendum)
Medication Instructions:   Please increase the vytorin up to 10/40 mg daily Goal LDL <70  Labwork:  No new labs needed  Testing/Procedures:  No further testing at this time   Follow-Up: It was a pleasure seeing you in the office today. Please call us if you have new issues that need to be addressed before your next appt.  [phone]  Your physician wants you to follow-up in: 6 months.  You will receive a reminder letter in the mail two months in advance. If you don't receive a letter, please call our office to schedule the follow-up appointment.  If you need a refill on your cardiac medications before your next appointment, please call your pharmacy.

## 2016-05-03 NOTE — Progress Notes (Signed)
Cardiology Office Note  Date:  05/03/2016   ID:  [name], DOB [birth year], MRN [id number]  PCP:  Ria Bush, MD   Chief Complaint  Patient presents with  . other    6 month follow up. Meds reviewed by the patient verbally. Needs a cardiac clearance for back surgery with Dr. Cay Schillings at [facility].     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   In follow-up, he presents with his wife Reports he needs back surgery given severe pain Reports he has back arthritis, also spinal stenosis Needs back surgery  With Dr. Rosamaria Lints Having leg and toe weakness Severe pain, waking him at night,  pain down both legs, episodes where he cant walk when getting out of bed Does not like pain pills  Had some indigestion pain, took some stomach medication, no further recurrent symptoms Denies having any significant chest pain on exertion He has been cutting back on his Vytorin  EKG on today's visit shows normal sinus rhythm with rate 83 bpm, left axis deviation, nonspecific ST abnormality   Other past medical history  AAA less than 3 cm,  mild bilateral carotid arterial disease estimated at 40-50% bilaterally. Numerous previous episode of chest pain relieved with belching consistent with GI etiology. diagnosis of obstructive sleep apnea. Could not handle CPAP despite numerous attempts. Symptoms better after weight loss total hip replacements x2  Previous cardiac catheterization 07/07/2015            Ost LM to LM lesion, 50% stenosed.            Ost RCA lesion, 80% stenosed.            Prox RCA-1 lesion, 80% stenosed.            Prox RCA-2 lesion, 80% stenosed.            LIMA was injected is moderate in size, and is anatomically normal.            Dist LAD lesion, 60%  stenosed.            Ost LAD to Prox LAD lesion, 100% stenosed.            Mid LAD lesion, 60% stenosed.            The left ventricular systolic function is normal.            SVG was injected is normal in caliber, and is anatomically normal.            2nd Diag lesion, 50% stenosed.   severe RCA disease (small vessel) not amenable to PCI, moderate left main disease, moderate LAD and proximal diagonal disease. --Patent grafts x2 --No intervention performed, started on isosorbide 30 mg daily   went to the emergency room in [facility] 07/03/2013 with chest pain. Had a CT scan of his chest which was essentially benign. Nitroglycerin did not help his pain. He ruled out and was sent home.  Repeat episode of chest pain 08/05/2013 with stress test at that time showing no significant ischemia, ejection fraction 48%. It was felt his chest pain was atypical in nature. She feels it was from GI etiology given his negative stress test.  He does report having some cold induced chest tightness/shortness of breath, asthma.    episode  of chest pain 05/14/2013. He felt was indigestion. He took several nitroglycerin with no significant improvement in his symptoms. Symptoms were severe, felt similar to his previous MI. He got in his car and on the way, had a big burp. Symptoms seemed to resolve at that time. He continued on to the emergency room. There cardiac enzymes were negative x2, EKG unchanged. He reports having had prior chest pain episode relieved with belching in the past.   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  PSH:    Past Surgical History:  Procedure Laterality Date  . APPENDECTOMY     rupture  . CARDIAC CATHETERIZATION [name] 07/07/2015   Procedure: Left Heart Cath and Cors/Grafts Angiography;  Surgeon: [name], MD;  Location: [facility];  Service: Cardiovascular;  Laterality: [name];  . CATARACT EXTRACTION, BILATERAL    . COLONOSCOPY  03/2010   HP polyp, diverticulosis, rpt 10 yrs ([name])  . CORONARY ARTERY BYPASS GRAFT  1990   3 vessel   . CYSTOSCOPY  12/23/10   [facility]  . KNEE ARTHROSCOPY     x2  . SPINE SURGERY  1984   lumbar for two ruptured discs/fusion  . TOTAL HIP ARTHROPLASTY Bilateral [id number]    Current Outpatient Prescriptions  Medication Sig Dispense Refill  . acetaminophen (TYLENOL) 500 MG tablet Take 1,000 mg by mouth daily.    Marland Kitchen aspirin 81 MG tablet Take 81 mg by mouth daily.     . finasteride (PROSCAR) 5 MG tablet Take 5 mg by mouth daily.      Marland Kitchen HYDROcodone-acetaminophen (NORCO/VICODIN) 5-325 MG tablet Take 1 tablet by mouth every 6 (six) hours as needed for moderate pain.    . isosorbide mononitrate (IMDUR) 30 MG 24 hr tablet Take 1 tablet (30 mg total) by mouth daily. 90 tablet 3  . nitroGLYCERIN (NITROSTAT) 0.4 MG SL tablet Place 0.4 mg under the tongue every 5 (five) minutes as needed for chest pain.    . pantoprazole (PROTONIX) 40 MG tablet Take 40 mg by mouth daily.    . solifenacin (VESICARE) 10 MG tablet Take 10 mg by mouth daily.    . vitamin B-12 (CYANOCOBALAMIN) 1000 MCG tablet Take 1 tablet (1,000 mcg total) by mouth daily.    Marland Kitchen ezetimibe-simvastatin (VYTORIN) 10-40 MG tablet Take 1 tablet by mouth daily. 30 tablet 11   No current facility-administered medications for this visit.       Allergies:   Morphine and Vioxx [rofecoxib]   Social History:  The patient  reports that he quit smoking about 27 years ago. His smoking use included Cigarettes. He has a 25.00 pack-year smoking history. He has never used smokeless tobacco. He reports that he does not drink alcohol or use drugs.   Family History:   family history includes Cancer in his other and sister; Diabetes in his brother and mother; Heart attack in his mother; Heart disease in his father; Kidney disease in his father and sister; Stroke in his father and mother.    Review of Systems: Review of  Systems  Constitutional: Negative.   Respiratory: Negative.   Cardiovascular: Negative.   Gastrointestinal: Negative.   Musculoskeletal: Positive for back pain and joint pain.  Neurological: Negative.   Psychiatric/Behavioral: Negative.   All other systems reviewed and are negative.    PHYSICAL EXAM: VS:  BP 138/68 (BP Location: Left Arm, Patient Position: Sitting, Cuff Size: Normal)   Pulse 83   Ht 6' (1.829 m)   Wt 221 lb 8 oz (100.5 kg)   BMI 30.04 kg/m  , BMI Body mass index is 30.04 kg/m. GEN: Well nourished, well developed, in no acute distress  HEENT: normal  Neck: no JVD, carotid bruits, or masses Cardiac: RRR; no murmurs, rubs, or gallops,no edema  Respiratory:  clear to auscultation bilaterally, normal work of breathing GI: soft, nontender, nondistended, + BS MS: no deformity or atrophy  Skin: warm and dry, no rash Neuro:  Strength and sensation are intact Psych: euthymic mood, full affect    Recent Labs: 06/29/2015: ALT 14 01/01/2016: BUN 18; Creatinine, Ser 1.27; Potassium 4.4; Sodium 139    Lipid Panel Lab Results  Component Value Date   CHOL 142 06/29/2015   HDL 35.80 (L) 06/29/2015   LDLCALC 81 06/29/2015   TRIG 125.0 06/29/2015      Wt Readings from Last 3 Encounters:  05/03/16 221 lb 8 oz (100.5 kg)  01/01/16 226 lb (102.5 kg)  11/05/15 226 lb (102.5 kg)        ASSESSMENT AND PLAN:  Atherosclerosis of coronary artery bypass graft of native heart without angina pectoris - Plan: EKG 12-Lead Currently with no symptoms of angina. No further workup at this time. Continue current medication regimen. Rare atypical chest pain symptoms, often relieved with belching Recent cardiac catheterization, no intervention performed Started on isosorbide  Essential hypertension - Plan: EKG 12-Lead Blood pressure is well controlled on today's visit. No changes made to the medications.  Hyperlipidemia Recommended he increase Vytorin up to 10/40 mg daily Goal LDL less than 70  Stable angina (HCC) Symptoms seem to have resolved on isosorbide Large component of atypical symptoms, discomfort often relieved with belching  Preop cardiovascular Acceptable risk for upcoming back surgery No further testing needed. He had recent cardiac catheterization me know revascularization needed. Denies having any anginal symptoms on his current medication regimen  Back pain Reports having severe arthritis, spinal stenosis We have discussed cardiac risk and benefit, he would like to proceed with surgery   Total encounter time more than 25 minutes  Greater than 50% was spent in counseling and coordination of care with the patient   Disposition:   F/U  6 months   Orders Placed This Encounter  Procedures  . EKG 12-Lead     Signed, Esmond Plants, M.D., Ph.D. 05/03/2016  [name], [place]

## 2016-05-17 ENCOUNTER — Telehealth: Payer: Self-pay

## 2016-05-17 ENCOUNTER — Telehealth: Payer: Self-pay | Admitting: *Deleted

## 2016-05-17 DIAGNOSIS — M1990 Unspecified osteoarthritis, unspecified site: Secondary | ICD-10-CM

## 2016-05-17 DIAGNOSIS — M5116 Intervertebral disc disorders with radiculopathy, lumbar region: Secondary | ICD-10-CM

## 2016-05-17 NOTE — Telephone Encounter (Signed)
Pt last seen 01/01/16; pt saw [facility] with back and both legs hurting and pt request referral to Dr [name] for 2nd opinion. Pt  Pt request cb.

## 2016-05-17 NOTE — Telephone Encounter (Signed)
Pt has been approved for Savaysa 10-40 mg tablet 05/12/16-08/14/16.

## 2016-05-18 ENCOUNTER — Ambulatory Visit (INDEPENDENT_AMBULATORY_CARE_PROVIDER_SITE_OTHER): Payer: PPO

## 2016-05-18 DIAGNOSIS — Z23 Encounter for immunization: Secondary | ICD-10-CM

## 2016-05-18 NOTE — Telephone Encounter (Signed)
Referral placed.

## 2016-05-31 DIAGNOSIS — R3914 Feeling of incomplete bladder emptying: Secondary | ICD-10-CM | POA: Diagnosis not present

## 2016-05-31 DIAGNOSIS — N401 Enlarged prostate with lower urinary tract symptoms: Secondary | ICD-10-CM | POA: Diagnosis not present

## 2016-05-31 DIAGNOSIS — R351 Nocturia: Secondary | ICD-10-CM | POA: Diagnosis not present

## 2016-06-05 DIAGNOSIS — R05 Cough: Secondary | ICD-10-CM | POA: Diagnosis not present

## 2016-06-05 DIAGNOSIS — R071 Chest pain on breathing: Secondary | ICD-10-CM | POA: Diagnosis not present

## 2016-06-09 DIAGNOSIS — K6289 Other specified diseases of anus and rectum: Secondary | ICD-10-CM | POA: Diagnosis not present

## 2016-06-09 DIAGNOSIS — K644 Residual hemorrhoidal skin tags: Secondary | ICD-10-CM | POA: Diagnosis not present

## 2016-06-10 DIAGNOSIS — M545 Low back pain: Secondary | ICD-10-CM | POA: Diagnosis not present

## 2016-06-10 DIAGNOSIS — M48062 Spinal stenosis, lumbar region with neurogenic claudication: Secondary | ICD-10-CM | POA: Diagnosis not present

## 2016-06-10 DIAGNOSIS — M5416 Radiculopathy, lumbar region: Secondary | ICD-10-CM | POA: Diagnosis not present

## 2016-06-20 ENCOUNTER — Other Ambulatory Visit: Payer: Self-pay | Admitting: Neurosurgery

## 2016-06-27 ENCOUNTER — Ambulatory Visit (INDEPENDENT_AMBULATORY_CARE_PROVIDER_SITE_OTHER): Payer: PPO | Admitting: Family Medicine

## 2016-06-27 ENCOUNTER — Encounter: Payer: Self-pay | Admitting: Family Medicine

## 2016-06-27 DIAGNOSIS — J069 Acute upper respiratory infection, unspecified: Secondary | ICD-10-CM | POA: Diagnosis not present

## 2016-06-27 MED ORDER — AMOXICILLIN-POT CLAVULANATE 875-125 MG PO TABS: 1.0000 | ORAL_TABLET | Freq: Two times a day (BID) | ORAL | 0 refills | Status: DC

## 2016-06-27 NOTE — Assessment & Plan Note (Signed)
Nontoxic, would hold abx for now.  Supportive care.  If sx continue through the week, then start augmentin.  Should have time to recover before scheduled surgery.  Update [place] as needed.  He agrees.  Okay for outpatient f/u.

## 2016-06-27 NOTE — Progress Notes (Signed)
Pre visit review using our clinic review tool, if applicable. No additional management support is needed unless otherwise documented below in the visit note. 

## 2016-06-27 NOTE — Patient Instructions (Signed)
Rest and fluids.  Likely viral.  Should get better.  If not better by the end of the week, then start the antibiotics.  Take care.  Glad to see you.

## 2016-06-27 NOTE — Progress Notes (Signed)
Duration of symptoms: started yesterday Rhinorrhea: some, mild L sided facial pressure.  Congestion: some chest congestion ear pain: no pain but L ear hearing is affected.  sore throat: some Cough: a little.  Myalgias: at baseline, unclear how much is from his back.   No fevers.   other concerns: back surgery pending for later this month.  Scheduled for 07/13/16.   No known sick contacts.   Still on baseline meds.    Per HPI unless specifically indicated in ROS section   Meds, vitals, and allergies reviewed.   GEN: nad, alert and oriented HEENT: mucous membranes moist, TM w/o erythema, nasal epithelium injected, OP without cobblestoning, L max sinus mildly ttp NECK: supple w/o LA CV: rrr. PULM: ctab, no inc wob ABD: soft, +bs

## 2016-07-04 ENCOUNTER — Ambulatory Visit (INDEPENDENT_AMBULATORY_CARE_PROVIDER_SITE_OTHER): Payer: PPO | Admitting: Family Medicine

## 2016-07-04 ENCOUNTER — Encounter (HOSPITAL_COMMUNITY)
Admission: RE | Admit: 2016-07-04 | Discharge: 2016-07-04 | Disposition: A | Discharge status: Home / Self Care | Payer: PPO | Source: Ambulatory Visit | Attending: Neurosurgery | Admitting: Neurosurgery

## 2016-07-04 ENCOUNTER — Encounter (HOSPITAL_COMMUNITY): Payer: Self-pay

## 2016-07-04 ENCOUNTER — Encounter: Payer: Self-pay | Admitting: Family Medicine

## 2016-07-04 VITALS — BP 138/74 | HR 64 | Temp 97.7°F | Wt 224.5 lb

## 2016-07-04 DIAGNOSIS — E785 Hyperlipidemia, unspecified: Secondary | ICD-10-CM | POA: Diagnosis not present

## 2016-07-04 DIAGNOSIS — E663 Overweight: Secondary | ICD-10-CM | POA: Insufficient documentation

## 2016-07-04 DIAGNOSIS — Z01818 Encounter for other preprocedural examination: Secondary | ICD-10-CM

## 2016-07-04 DIAGNOSIS — E119 Type 2 diabetes mellitus without complications: Secondary | ICD-10-CM

## 2016-07-04 DIAGNOSIS — I10 Essential (primary) hypertension: Secondary | ICD-10-CM

## 2016-07-04 DIAGNOSIS — E669 Obesity, unspecified: Secondary | ICD-10-CM

## 2016-07-04 DIAGNOSIS — I25810 Atherosclerosis of coronary artery bypass graft(s) without angina pectoris: Secondary | ICD-10-CM

## 2016-07-04 DIAGNOSIS — K219 Gastro-esophageal reflux disease without esophagitis: Secondary | ICD-10-CM

## 2016-07-04 DIAGNOSIS — E538 Deficiency of other specified B group vitamins: Secondary | ICD-10-CM | POA: Diagnosis not present

## 2016-07-04 HISTORY — DX: Personal history of other diseases of the digestive system: Z87.19

## 2016-07-04 HISTORY — DX: Headache, unspecified: R51.9

## 2016-07-04 HISTORY — DX: Headache: R51

## 2016-07-04 LAB — CBC WITH DIFFERENTIAL/PLATELET
Basophils Absolute: 0 10*3/uL (ref 0.0–0.1)
Basophils Relative: 0.5 % (ref 0.0–3.0)
Eosinophils Absolute: 0.3 10*3/uL (ref 0.0–0.7)
Eosinophils Relative: 4.5 % (ref 0.0–5.0)
HCT: 43.5 % (ref 39.0–52.0)
Hemoglobin: 14.9 g/dL (ref 13.0–17.0)
Lymphocytes Relative: 24.2 % (ref 12.0–46.0)
Lymphs Abs: 1.4 10*3/uL (ref 0.7–4.0)
MCHC: 34.3 g/dL (ref 30.0–36.0)
MCV: 93.9 fl (ref 78.0–100.0)
Monocytes Absolute: 0.5 10*3/uL (ref 0.1–1.0)
Monocytes Relative: 9.5 % (ref 3.0–12.0)
Neutro Abs: 3.5 10*3/uL (ref 1.4–7.7)
Neutrophils Relative %: 61.3 % (ref 43.0–77.0)
Platelets: 215 10*3/uL (ref 150.0–400.0)
RBC: 4.64 Mil/uL (ref 4.22–5.81)
RDW: 13 % (ref 11.5–15.5)
WBC: 5.8 10*3/uL (ref 4.0–10.5)

## 2016-07-04 LAB — LIPID PANEL
Cholesterol: 164 mg/dL (ref 0–200)
HDL: 37.1 mg/dL — ABNORMAL LOW (ref >39.00)
LDL Cholesterol: 96 mg/dL (ref 0–99)
NonHDL: 126.7
Total CHOL/HDL Ratio: 4
Triglycerides: 156 mg/dL — ABNORMAL HIGH (ref 0.0–149.0)
VLDL: 31.2 mg/dL (ref 0.0–40.0)

## 2016-07-04 LAB — HEPATIC FUNCTION PANEL
ALT: 16 U/L (ref 0–53)
AST: 15 U/L (ref 0–37)
Albumin: 4.3 g/dL (ref 3.5–5.2)
Alkaline Phosphatase: 31 U/L — ABNORMAL LOW (ref 39–117)
Bilirubin, Direct: 0.1 mg/dL (ref 0.0–0.3)
Total Bilirubin: 0.7 mg/dL (ref 0.2–1.2)
Total Protein: 7 g/dL (ref 6.0–8.3)

## 2016-07-04 LAB — HEMOGLOBIN A1C: Hgb A1c MFr Bld: 6.7 % — ABNORMAL HIGH (ref 4.6–6.5)

## 2016-07-04 LAB — BASIC METABOLIC PANEL
BUN: 21 mg/dL (ref 6–23)
CO2: 29 mEq/L (ref 19–32)
Calcium: 9.7 mg/dL (ref 8.4–10.5)
Chloride: 103 mEq/L (ref 96–112)
Creatinine, Ser: 1.22 mg/dL (ref 0.40–1.50)
GFR: 61.51 mL/min (ref >60.00)
Glucose, Bld: 131 mg/dL — ABNORMAL HIGH (ref 70–99)
Potassium: 4.5 mEq/L (ref 3.5–5.1)
Sodium: 140 mEq/L (ref 135–145)

## 2016-07-04 LAB — PROTIME-INR
INR: 1.1 ratio — ABNORMAL HIGH (ref 0.8–1.0)
Prothrombin Time: 11.2 s (ref 9.6–13.1)

## 2016-07-04 LAB — VITAMIN B12: Vitamin B-12: 359 pg/mL (ref 211–911)

## 2016-07-04 LAB — SURGICAL PCR SCREEN
MRSA, PCR: NEGATIVE
Staphylococcus aureus: NEGATIVE

## 2016-07-04 NOTE — Pre-Procedure Instructions (Addendum)
[facility]  07/04/2016      [facility] - [address] [place] [address] Phone: [phone] Fax: [phone]    Your procedure is scheduled on 07/13/16  Report to [facility] Admitting at 830 A.M.  Call this number if you have problems the morning of surgery:  [phone]   Remember:  Do not eat food or drink liquids after midnight.  Take these medicines the morning of surgery with A SIP OF WATER proscar,hydrocodone if needed,isosorbide(imdur),nitro if needed,pantoprazole(protonix)   STOP all herbel meds, nsaids (aleve,naproxen,advil,ibuprofen) 7 days prior to surgery starting11/22/17 including all vitamins,aspirin    Do not wear jewelry, make-up or nail polish.  Do not wear lotions, powders, or perfumes, or deoderant.  Do not shave 48 hours prior to surgery.  Men may shave face and neck.  Do not bring valuables to the hospital.  [facility] is not responsible for any belongings or valuables.  Contacts, dentures or bridgework may not be worn into surgery.  Leave your suitcase in the car.  After surgery it may be brought to your room.  For patients admitted to the hospital, discharge time will be determined by your treatment team.  Patients discharged the day of surgery will not be allowed to drive home.   Special instructions:   Special Instructions: [facility] - Preparing for Surgery  Before surgery, you can play an important role.  Because skin is not sterile, your skin needs to be as free of germs as possible.  You can reduce the number of germs on you skin by washing with CHG (chlorahexidine gluconate) soap before surgery.  CHG is an antiseptic cleaner which kills germs and bonds with the skin to continue killing germs even after washing.  Please DO NOT use if you have an allergy to CHG or antibacterial soaps.  If your skin becomes reddened/irritated stop using the CHG and inform your nurse when you  arrive at Short Stay.  Do not shave (including legs and underarms) for at least 48 hours prior to the first CHG shower.  You may shave your face.  Please follow these instructions carefully:   1.  Shower with CHG Soap the night before surgery and the morning of Surgery.  2.  If you choose to wash your hair, wash your hair first as usual with your normal shampoo.  3.  After you shampoo, rinse your hair and body thoroughly to remove the Shampoo.  4.  Use CHG as you would any other liquid soap.  You can apply chg directly  to the skin and wash gently with scrungie or a clean washcloth.  5.  Apply the CHG Soap to your body ONLY FROM THE NECK DOWN.  Do not use on open wounds or open sores.  Avoid contact with your eyes ears, mouth and genitals (private parts).  Wash genitals (private parts)       with your normal soap.  6.  Wash thoroughly, paying special attention to the area where your surgery will be performed.  7.  Thoroughly rinse your body with warm water from the neck down.  8.  DO NOT shower/wash with your normal soap after using and rinsing off the CHG Soap.  9.  Pat yourself dry with a clean towel.            10.  Wear clean pajamas.  11.  Place clean sheets on your bed the night of your first shower and do not sleep with pets.  Day of Surgery  Do not apply any lotions/deodorants the morning of surgery.  Please wear clean clothes to the hospital/surgery center.  Please read over the fact sheets that you were given.

## 2016-07-04 NOTE — Assessment & Plan Note (Addendum)
Continue aspirin/imdur/zetia/simvastatin.

## 2016-07-04 NOTE — Assessment & Plan Note (Signed)
FLP today. Reports compliance and tolerance with statin and zetia.

## 2016-07-04 NOTE — Assessment & Plan Note (Signed)
Activity limited by back pain. Discussed hopeful for increased activity after back surgery.

## 2016-07-04 NOTE — Progress Notes (Signed)
BP 138/74   Pulse 64   Temp 97.7 F (36.5 C) (Oral)   Wt 224 lb 8 oz (101.8 kg)   BMI 30.45 kg/m    CC: 6 mo f/u visit Subjective:    Patient ID: [name], male    DOB: 03/29/1941, 75 y.o.   MRN: [id number]  HPI: [name] is a 75 y.o. male presenting on 07/04/2016 for Follow-up   Upcoming back surgery at end of the month. Looking forward to surgery.   Seen last week by Dr Damita Dunnings with URI - ended up not needing augmentin course.   DM - regularly does not check sugars. Compliant with antihyperglycemic regimen which includes: diet controlled. Denies low sugars or hypoglycemic symptoms. Some paresthesias thought from spinal stenosis. Last diabetic eye exam 08/2015.  Pneumovax: 10/2008.  Prevnar: 01/2015.  Lab Results  Component Value Date   HGBA1C 6.8 (H) 01/01/2016   Diabetic Foot Exam - Simple   No data filed       CAD/GERD - compliant with aspirin, imdur, protonix. Endorses ongoing burning sensation in chest with walking in the cold.   Relevant past medical, surgical, family and social history reviewed and updated as indicated. Interim medical history since our last visit reviewed. Allergies and medications reviewed and updated. Current Outpatient Prescriptions on File Prior to Visit  Medication Sig  . acetaminophen (TYLENOL) 325 MG tablet Take 650 mg by mouth every 6 (six) hours as needed.  Marland Kitchen aspirin 81 MG tablet Take 81 mg by mouth daily.   Marland Kitchen ezetimibe (ZETIA) 10 MG tablet Take 10 mg by mouth daily.  . finasteride (PROSCAR) 5 MG tablet Take 5 mg by mouth daily.    Marland Kitchen HYDROcodone-acetaminophen (NORCO/VICODIN) 5-325 MG tablet Take 1 tablet by mouth every 6 (six) hours as needed for moderate pain.  . isosorbide mononitrate (IMDUR) 30 MG 24 hr tablet Take 1 tablet (30 mg total) by mouth daily.  . nitroGLYCERIN (NITROSTAT) 0.4 MG SL tablet Place 0.4 mg under the tongue every 5 (five) minutes as needed for chest pain.  . pantoprazole (PROTONIX) 40 MG tablet Take  40 mg by mouth daily.  . simvastatin (ZOCOR) 40 MG tablet Take 40 mg by mouth daily.  . solifenacin (VESICARE) 10 MG tablet Take 10 mg by mouth daily.  . vitamin B-12 (CYANOCOBALAMIN) 1000 MCG tablet Take 1 tablet (1,000 mcg total) by mouth daily.   No current facility-administered medications on file prior to visit.     Review of Systems Per HPI unless specifically indicated in ROS section     Objective:    BP 138/74   Pulse 64   Temp 97.7 F (36.5 C) (Oral)   Wt 224 lb 8 oz (101.8 kg)   BMI 30.45 kg/m   Wt Readings from Last 3 Encounters:  07/04/16 224 lb 8 oz (101.8 kg)  06/27/16 225 lb (102.1 kg)  05/03/16 221 lb 8 oz (100.5 kg)    Physical Exam  Constitutional: He appears well-developed and well-nourished. No distress.  HENT:  Head: Normocephalic and atraumatic.  Right Ear: External ear normal.  Left Ear: External ear normal.  Nose: Nose normal.  Mouth/Throat: Oropharynx is clear and moist. No oropharyngeal exudate.  Eyes: Conjunctivae and EOM are normal. Pupils are equal, round, and reactive to light. No scleral icterus.  Neck: Normal range of motion. Neck supple.  Cardiovascular: Normal rate, regular rhythm, normal heart sounds and intact distal pulses.   No murmur heard. Pulmonary/Chest: Effort normal and breath  sounds normal. No respiratory distress. He has no wheezes. He has no rales.  Musculoskeletal: He exhibits no edema.  See HPI for foot exam if done  Lymphadenopathy:    He has no cervical adenopathy.  Neurological:  Stiff movements due ot back pain  Skin: Skin is warm and dry. No rash noted.  Psychiatric: He has a normal mood and affect.  Nursing note and vitals reviewed.  Results for orders placed or performed in visit on 123456  Basic metabolic panel  Result Value Ref Range   Sodium 139 135 - 145 mEq/L   Potassium 4.4 3.5 - 5.1 mEq/L   Chloride 106 96 - 112 mEq/L   CO2 29 19 - 32 mEq/L   Glucose, Bld 118 (H) 70 - 99 mg/dL   BUN 18 6 - 23  mg/dL   Creatinine, Ser 1.27 0.40 - 1.50 mg/dL   Calcium 8.9 8.4 - 10.5 mg/dL   GFR 58.80 (L) >60.00 mL/min  Hemoglobin A1c  Result Value Ref Range   Hgb A1c MFr Bld 6.8 (H) 4.6 - 6.5 %  Microalbumin / creatinine urine ratio  Result Value Ref Range   Microalb, Ur <0.7 0.0 - 1.9 mg/dL   Creatinine,U 167.8 mg/dL   Microalb Creat Ratio 0.4 0.0 - 30.0 mg/g  LDL Cholesterol, Direct  Result Value Ref Range   Direct LDL 106.0 mg/dL  HM DIABETES EYE EXAM  Result Value Ref Range   HM Diabetic Eye Exam No Retinopathy No Retinopathy   Lab Results  Component Value Date   VITAMINB12 239 06/29/2015       Assessment & Plan:   Problem List Items Addressed This Visit    CAD, ARTERY BYPASS GRAFT    Continue aspirin/imdur/zetia/simvastatin.      Diet-controlled diabetes mellitus ([place]) - Primary    Chronic, stable. Continue current regimen of diet control.       Relevant Orders   Hemoglobin 123456   Basic metabolic panel   Essential hypertension    Chronic, stable. Continue current regimen.      GERD    Continue PPI      Hyperlipidemia    FLP today. Reports compliance and tolerance with statin and zetia.      Relevant Orders   Lipid panel   Hepatic function panel   Low vitamin B12 level    Update B12. Reports compliance with oral b12 level.      Relevant Orders   Vitamin B12   Obesity, Class I, BMI 30-34.9    Activity limited by back pain. Discussed hopeful for increased activity after back surgery.       Other Visit Diagnoses    Preoperative clearance       Relevant Orders   Protime-INR   CBC with Differential/Platelet       Follow up plan: Return in about 6 months (around 01/01/2017) for medicare wellness visit.  Ria Bush, MD

## 2016-07-04 NOTE — Patient Instructions (Signed)
Good to see you today. Good luck with upcoming surgery - I hope for a speedy recovery.  Labs today.  Return as needed or in 6 months for wellness visit.

## 2016-07-04 NOTE — Assessment & Plan Note (Signed)
Update B12. Reports compliance with oral b12 level.

## 2016-07-04 NOTE — Progress Notes (Signed)
Pre visit review using our clinic review tool, if applicable. No additional management support is needed unless otherwise documented below in the visit note. 

## 2016-07-04 NOTE — Assessment & Plan Note (Signed)
Chronic, stable. Continue current regimen. 

## 2016-07-04 NOTE — Assessment & Plan Note (Signed)
Continue PPI ?

## 2016-07-04 NOTE — Assessment & Plan Note (Signed)
Chronic, stable. Continue current regimen of diet control.

## 2016-07-13 ENCOUNTER — Ambulatory Visit (HOSPITAL_COMMUNITY): Payer: PPO | Admitting: Anesthesiology

## 2016-07-13 ENCOUNTER — Observation Stay (HOSPITAL_COMMUNITY)
Admission: RE | Admit: 2016-07-13 | Discharge: 2016-07-15 | Disposition: A | Discharge status: Home / Self Care | Payer: PPO | Source: Ambulatory Visit | Attending: Neurosurgery | Admitting: Neurosurgery

## 2016-07-13 ENCOUNTER — Ambulatory Visit (HOSPITAL_COMMUNITY): Payer: PPO

## 2016-07-13 ENCOUNTER — Encounter (HOSPITAL_COMMUNITY)
Admission: RE | Disposition: A | Discharge status: Home / Self Care | Payer: Self-pay | Source: Ambulatory Visit | Attending: Neurosurgery

## 2016-07-13 DIAGNOSIS — I2510 Atherosclerotic heart disease of native coronary artery without angina pectoris: Secondary | ICD-10-CM | POA: Diagnosis not present

## 2016-07-13 DIAGNOSIS — E1151 Type 2 diabetes mellitus with diabetic peripheral angiopathy without gangrene: Secondary | ICD-10-CM | POA: Insufficient documentation

## 2016-07-13 DIAGNOSIS — N400 Enlarged prostate without lower urinary tract symptoms: Secondary | ICD-10-CM | POA: Insufficient documentation

## 2016-07-13 DIAGNOSIS — K219 Gastro-esophageal reflux disease without esophagitis: Secondary | ICD-10-CM | POA: Diagnosis not present

## 2016-07-13 DIAGNOSIS — M48062 Spinal stenosis, lumbar region with neurogenic claudication: Principal | ICD-10-CM | POA: Insufficient documentation

## 2016-07-13 DIAGNOSIS — Z87891 Personal history of nicotine dependence: Secondary | ICD-10-CM | POA: Diagnosis not present

## 2016-07-13 DIAGNOSIS — M4326 Fusion of spine, lumbar region: Secondary | ICD-10-CM | POA: Diagnosis not present

## 2016-07-13 DIAGNOSIS — Z419 Encounter for procedure for purposes other than remedying health state, unspecified: Secondary | ICD-10-CM

## 2016-07-13 DIAGNOSIS — I252 Old myocardial infarction: Secondary | ICD-10-CM | POA: Insufficient documentation

## 2016-07-13 DIAGNOSIS — M1990 Unspecified osteoarthritis, unspecified site: Secondary | ICD-10-CM | POA: Insufficient documentation

## 2016-07-13 DIAGNOSIS — Z7982 Long term (current) use of aspirin: Secondary | ICD-10-CM | POA: Insufficient documentation

## 2016-07-13 DIAGNOSIS — M5416 Radiculopathy, lumbar region: Secondary | ICD-10-CM | POA: Insufficient documentation

## 2016-07-13 DIAGNOSIS — M545 Low back pain: Secondary | ICD-10-CM | POA: Diagnosis not present

## 2016-07-13 DIAGNOSIS — E785 Hyperlipidemia, unspecified: Secondary | ICD-10-CM | POA: Insufficient documentation

## 2016-07-13 DIAGNOSIS — I10 Essential (primary) hypertension: Secondary | ICD-10-CM | POA: Diagnosis not present

## 2016-07-13 HISTORY — PX: LUMBAR LAMINECTOMY/DECOMPRESSION MICRODISCECTOMY: SHX5026

## 2016-07-13 LAB — GLUCOSE, CAPILLARY
Glucose-Capillary: 135 mg/dL — ABNORMAL HIGH (ref 65–99)
Glucose-Capillary: 111 mg/dL — ABNORMAL HIGH (ref 65–99)

## 2016-07-13 SURGERY — LUMBAR LAMINECTOMY/DECOMPRESSION MICRODISCECTOMY 3 LEVELS
Anesthesia: General | Site: Spine Lumbar

## 2016-07-13 MED ORDER — PHENYLEPHRINE HCL 10 MG/ML IJ SOLN
INTRAMUSCULAR | Status: DC | PRN
  Administered 2016-07-13: 20 ug/min via INTRAVENOUS

## 2016-07-13 MED ORDER — BACITRACIN ZINC 500 UNIT/GM EX OINT
TOPICAL_OINTMENT | CUTANEOUS | Status: DC | PRN
  Administered 2016-07-13: 1 via TOPICAL

## 2016-07-13 MED ORDER — OXYCODONE-ACETAMINOPHEN 5-325 MG PO TABS
ORAL_TABLET | ORAL | Status: AC
  Filled 2016-07-13: qty 2

## 2016-07-13 MED ORDER — EZETIMIBE 10 MG PO TABS
10.0000 mg | ORAL_TABLET | Freq: Every day | ORAL | Status: DC
  Administered 2016-07-13 – 2016-07-15 (×3): 10 mg via ORAL
  Filled 2016-07-13 (×3): qty 1

## 2016-07-13 MED ORDER — FINASTERIDE 5 MG PO TABS
5.0000 mg | ORAL_TABLET | Freq: Every day | ORAL | Status: DC
  Administered 2016-07-13 – 2016-07-15 (×3): 5 mg via ORAL
  Filled 2016-07-13 (×3): qty 1

## 2016-07-13 MED ORDER — ALUM & MAG HYDROXIDE-SIMETH 200-200-20 MG/5ML PO SUSP: 30.0000 mL | Freq: Four times a day (QID) | ORAL | Status: DC | PRN

## 2016-07-13 MED ORDER — LIDOCAINE HCL (CARDIAC) 20 MG/ML IV SOLN
INTRAVENOUS | Status: DC | PRN
  Administered 2016-07-13: 60 mg via INTRAVENOUS

## 2016-07-13 MED ORDER — THROMBIN 20000 UNITS EX SOLR
CUTANEOUS | Status: DC | PRN
  Administered 2016-07-13: 11:00:00 via TOPICAL

## 2016-07-13 MED ORDER — ROCURONIUM BROMIDE 100 MG/10ML IV SOLN
INTRAVENOUS | Status: DC | PRN
  Administered 2016-07-13: 10 mg via INTRAVENOUS
  Administered 2016-07-13: 60 mg via INTRAVENOUS
  Administered 2016-07-13 (×2): 10 mg via INTRAVENOUS

## 2016-07-13 MED ORDER — EPHEDRINE 5 MG/ML INJ
INTRAVENOUS | Status: AC
  Filled 2016-07-13: qty 10

## 2016-07-13 MED ORDER — THROMBIN 20000 UNITS EX SOLR
CUTANEOUS | Status: AC
  Filled 2016-07-13: qty 20000

## 2016-07-13 MED ORDER — ONDANSETRON HCL 4 MG/2ML IJ SOLN
INTRAMUSCULAR | Status: DC | PRN
Start: 2016-07-13 — End: 2016-07-13
  Administered 2016-07-13: 4 mg via INTRAVENOUS

## 2016-07-13 MED ORDER — PROPOFOL 10 MG/ML IV BOLUS
INTRAVENOUS | Status: AC
  Filled 2016-07-13: qty 20

## 2016-07-13 MED ORDER — DIAZEPAM 5 MG PO TABS
ORAL_TABLET | ORAL | Status: AC
  Filled 2016-07-13: qty 1

## 2016-07-13 MED ORDER — LIDOCAINE-EPINEPHRINE (PF) 2 %-1:200000 IJ SOLN
INTRAMUSCULAR | Status: AC
  Filled 2016-07-13: qty 20

## 2016-07-13 MED ORDER — CEFAZOLIN SODIUM-DEXTROSE 2-4 GM/100ML-% IV SOLN
2.0000 g | Freq: Three times a day (TID) | INTRAVENOUS | Status: AC
  Administered 2016-07-13 – 2016-07-14 (×2): 2 g via INTRAVENOUS
  Filled 2016-07-13 (×2): qty 100

## 2016-07-13 MED ORDER — MENTHOL 3 MG MT LOZG
1.0000 | LOZENGE | OROMUCOSAL | Status: DC | PRN
  Filled 2016-07-13: qty 9

## 2016-07-13 MED ORDER — LIDOCAINE-EPINEPHRINE (PF) 2 %-1:200000 IJ SOLN
INTRAMUSCULAR | Status: DC | PRN
Start: 2016-07-13 — End: 2016-07-13
  Administered 2016-07-13: 10 mL

## 2016-07-13 MED ORDER — SUGAMMADEX SODIUM 200 MG/2ML IV SOLN
INTRAVENOUS | Status: DC | PRN
  Administered 2016-07-13: 200 mg via INTRAVENOUS

## 2016-07-13 MED ORDER — FENTANYL CITRATE (PF) 100 MCG/2ML IJ SOLN
INTRAMUSCULAR | Status: DC | PRN
  Administered 2016-07-13 (×4): 50 ug via INTRAVENOUS

## 2016-07-13 MED ORDER — MIDAZOLAM HCL 2 MG/2ML IJ SOLN
INTRAMUSCULAR | Status: AC
  Filled 2016-07-13: qty 2

## 2016-07-13 MED ORDER — CHLORHEXIDINE GLUCONATE CLOTH 2 % EX PADS: 6.0000 | MEDICATED_PAD | Freq: Once | CUTANEOUS | Status: DC

## 2016-07-13 MED ORDER — PROMETHAZINE HCL 25 MG/ML IJ SOLN: 6.2500 mg | INTRAMUSCULAR | Status: DC | PRN

## 2016-07-13 MED ORDER — HYDROMORPHONE HCL 1 MG/ML IJ SOLN
0.2500 mg | INTRAMUSCULAR | Status: DC | PRN
  Administered 2016-07-13 (×4): 0.5 mg via INTRAVENOUS

## 2016-07-13 MED ORDER — PROPOFOL 10 MG/ML IV BOLUS
INTRAVENOUS | Status: DC | PRN
  Administered 2016-07-13: 100 mg via INTRAVENOUS

## 2016-07-13 MED ORDER — ISOSORBIDE MONONITRATE ER 30 MG PO TB24
30.0000 mg | ORAL_TABLET | Freq: Every day | ORAL | Status: DC
  Administered 2016-07-15: 30 mg via ORAL
  Filled 2016-07-13 (×3): qty 1

## 2016-07-13 MED ORDER — OXYCODONE-ACETAMINOPHEN 5-325 MG PO TABS
1.0000 | ORAL_TABLET | ORAL | Status: DC | PRN
  Administered 2016-07-13 – 2016-07-15 (×8): 2 via ORAL
  Filled 2016-07-13 (×8): qty 2

## 2016-07-13 MED ORDER — SIMVASTATIN 40 MG PO TABS
40.0000 mg | ORAL_TABLET | Freq: Every day | ORAL | Status: DC
  Administered 2016-07-13 – 2016-07-15 (×3): 40 mg via ORAL
  Filled 2016-07-13 (×3): qty 1

## 2016-07-13 MED ORDER — HYDROCODONE-ACETAMINOPHEN 5-325 MG PO TABS: 1.0000 | ORAL_TABLET | ORAL | Status: DC | PRN

## 2016-07-13 MED ORDER — CEFAZOLIN SODIUM-DEXTROSE 2-4 GM/100ML-% IV SOLN
INTRAVENOUS | Status: AC
  Filled 2016-07-13: qty 100

## 2016-07-13 MED ORDER — MIDAZOLAM HCL 5 MG/5ML IJ SOLN
INTRAMUSCULAR | Status: DC | PRN
  Administered 2016-07-13: 2 mg via INTRAVENOUS

## 2016-07-13 MED ORDER — 0.9 % SODIUM CHLORIDE (POUR BTL) OPTIME
TOPICAL | Status: DC | PRN
Start: 2016-07-13 — End: 2016-07-13
  Administered 2016-07-13: 1000 mL

## 2016-07-13 MED ORDER — ONDANSETRON HCL 4 MG/2ML IJ SOLN: 4.0000 mg | INTRAMUSCULAR | Status: DC | PRN

## 2016-07-13 MED ORDER — NITROGLYCERIN 0.4 MG SL SUBL: 0.4000 mg | SUBLINGUAL_TABLET | SUBLINGUAL | Status: DC | PRN

## 2016-07-13 MED ORDER — PHENYLEPHRINE 40 MCG/ML (10ML) SYRINGE FOR IV PUSH (FOR BLOOD PRESSURE SUPPORT)
PREFILLED_SYRINGE | INTRAVENOUS | Status: DC | PRN
  Administered 2016-07-13: 80 ug via INTRAVENOUS

## 2016-07-13 MED ORDER — BACITRACIN ZINC 500 UNIT/GM EX OINT
TOPICAL_OINTMENT | CUTANEOUS | Status: AC
  Filled 2016-07-13: qty 28.35

## 2016-07-13 MED ORDER — SODIUM CHLORIDE 0.9 % IR SOLN
Status: DC | PRN
  Administered 2016-07-13: 11:00:00

## 2016-07-13 MED ORDER — ACETAMINOPHEN 325 MG PO TABS: 650.0000 mg | ORAL_TABLET | ORAL | Status: DC | PRN

## 2016-07-13 MED ORDER — ACETAMINOPHEN 650 MG RE SUPP: 650.0000 mg | RECTAL | Status: DC | PRN

## 2016-07-13 MED ORDER — PHENOL 1.4 % MT LIQD: 1.0000 | OROMUCOSAL | Status: DC | PRN

## 2016-07-13 MED ORDER — PANTOPRAZOLE SODIUM 40 MG PO TBEC
40.0000 mg | DELAYED_RELEASE_TABLET | Freq: Every day | ORAL | Status: DC
  Administered 2016-07-14 – 2016-07-15 (×2): 40 mg via ORAL
  Filled 2016-07-13 (×3): qty 1

## 2016-07-13 MED ORDER — CEFAZOLIN SODIUM-DEXTROSE 2-4 GM/100ML-% IV SOLN
2.0000 g | INTRAVENOUS | Status: AC
  Administered 2016-07-13: 2 g via INTRAVENOUS

## 2016-07-13 MED ORDER — DARIFENACIN HYDROBROMIDE ER 7.5 MG PO TB24
7.5000 mg | ORAL_TABLET | Freq: Every day | ORAL | Status: DC
  Administered 2016-07-13 – 2016-07-15 (×3): 7.5 mg via ORAL
  Filled 2016-07-13 (×3): qty 1

## 2016-07-13 MED ORDER — HYDROMORPHONE HCL 1 MG/ML IJ SOLN
0.5000 mg | INTRAMUSCULAR | Status: DC | PRN
  Administered 2016-07-13 – 2016-07-15 (×3): 0.5 mg via INTRAVENOUS
  Filled 2016-07-13 (×3): qty 1

## 2016-07-13 MED ORDER — LACTATED RINGERS IV SOLN
INTRAVENOUS | Status: DC
  Administered 2016-07-13 (×2): via INTRAVENOUS
  Administered 2016-07-13: 50 mL/h via INTRAVENOUS

## 2016-07-13 MED ORDER — BISACODYL 10 MG RE SUPP: 10.0000 mg | Freq: Every day | RECTAL | Status: DC | PRN

## 2016-07-13 MED ORDER — EPHEDRINE SULFATE 50 MG/ML IJ SOLN
INTRAMUSCULAR | Status: DC | PRN
  Administered 2016-07-13: 10 mg via INTRAVENOUS

## 2016-07-13 MED ORDER — HYDROMORPHONE HCL 1 MG/ML IJ SOLN
INTRAMUSCULAR | Status: AC
  Filled 2016-07-13: qty 0.5

## 2016-07-13 MED ORDER — THROMBIN 5000 UNITS EX SOLR
OROMUCOSAL | Status: DC | PRN
  Administered 2016-07-13: 12:00:00 via TOPICAL

## 2016-07-13 MED ORDER — FENTANYL CITRATE (PF) 100 MCG/2ML IJ SOLN
INTRAMUSCULAR | Status: AC
  Filled 2016-07-13: qty 4

## 2016-07-13 MED ORDER — PHENYLEPHRINE 40 MCG/ML (10ML) SYRINGE FOR IV PUSH (FOR BLOOD PRESSURE SUPPORT)
PREFILLED_SYRINGE | INTRAVENOUS | Status: AC
  Filled 2016-07-13: qty 10

## 2016-07-13 MED ORDER — DOCUSATE SODIUM 100 MG PO CAPS
100.0000 mg | ORAL_CAPSULE | Freq: Two times a day (BID) | ORAL | Status: DC
  Administered 2016-07-13 – 2016-07-15 (×4): 100 mg via ORAL
  Filled 2016-07-13 (×4): qty 1

## 2016-07-13 MED ORDER — SUCCINYLCHOLINE CHLORIDE 200 MG/10ML IV SOSY
PREFILLED_SYRINGE | INTRAVENOUS | Status: AC
  Filled 2016-07-13: qty 10

## 2016-07-13 MED ORDER — DIAZEPAM 5 MG PO TABS
5.0000 mg | ORAL_TABLET | Freq: Four times a day (QID) | ORAL | Status: DC | PRN
  Administered 2016-07-13 – 2016-07-14 (×3): 5 mg via ORAL
  Filled 2016-07-13 (×2): qty 1

## 2016-07-13 MED ORDER — ESMOLOL HCL 100 MG/10ML IV SOLN
INTRAVENOUS | Status: AC
  Filled 2016-07-13: qty 10

## 2016-07-13 MED ORDER — LACTATED RINGERS IV SOLN: INTRAVENOUS | Status: DC

## 2016-07-13 MED ORDER — THROMBIN 5000 UNITS EX SOLR
CUTANEOUS | Status: AC
  Filled 2016-07-13: qty 5000

## 2016-07-13 MED ORDER — ONDANSETRON HCL 4 MG/2ML IJ SOLN
INTRAMUSCULAR | Status: AC
  Filled 2016-07-13: qty 2

## 2016-07-13 MED ORDER — FENTANYL CITRATE (PF) 100 MCG/2ML IJ SOLN
INTRAMUSCULAR | Status: AC
  Filled 2016-07-13: qty 2

## 2016-07-13 SURGICAL SUPPLY — 52 items
APL SKNCLS STERI-STRIP NONHPOA (GAUZE/BANDAGES/DRESSINGS) ×1
BAG DECANTER FOR FLEXI CONT (MISCELLANEOUS) ×2 IMPLANT
BENZOIN TINCTURE PRP APPL 2/3 (GAUZE/BANDAGES/DRESSINGS) ×2 IMPLANT
BLADE CLIPPER SURG (BLADE) ×1 IMPLANT
BUR MATCHSTICK NEURO 3.0 LAGG (BURR) ×2 IMPLANT
BUR PRECISION FLUTE 6.0 (BURR) ×2 IMPLANT
CANISTER SUCT 3000ML PPV (MISCELLANEOUS) ×2 IMPLANT
CARTRIDGE OIL MAESTRO DRILL (MISCELLANEOUS) ×1 IMPLANT
DIFFUSER DRILL AIR PNEUMATIC (MISCELLANEOUS) ×2 IMPLANT
DRAPE LAPAROTOMY 100X72X124 (DRAPES) ×2 IMPLANT
DRAPE MICROSCOPE LEICA (MISCELLANEOUS) ×2 IMPLANT
DRAPE POUCH INSTRU U-SHP 10X18 (DRAPES) ×2 IMPLANT
DRAPE SURG 17X23 STRL (DRAPES) ×8 IMPLANT
ELECT BLADE 4.0 EZ CLEAN MEGAD (MISCELLANEOUS) ×2
ELECT REM PT RETURN 9FT ADLT (ELECTROSURGICAL) ×2
ELECTRODE BLDE 4.0 EZ CLN MEGD (MISCELLANEOUS) ×1 IMPLANT
ELECTRODE REM PT RTRN 9FT ADLT (ELECTROSURGICAL) ×1 IMPLANT
GAUZE SPONGE 4X4 12PLY STRL (GAUZE/BANDAGES/DRESSINGS) ×2 IMPLANT
GAUZE SPONGE 4X4 16PLY XRAY LF (GAUZE/BANDAGES/DRESSINGS) ×1 IMPLANT
GLOVE BIO SURGEON STRL SZ8 (GLOVE) ×2 IMPLANT
GLOVE BIO SURGEON STRL SZ8.5 (GLOVE) ×2 IMPLANT
GLOVE BIOGEL PI IND STRL 7.0 (GLOVE) IMPLANT
GLOVE BIOGEL PI IND STRL 7.5 (GLOVE) IMPLANT
GLOVE BIOGEL PI INDICATOR 7.0 (GLOVE) ×1
GLOVE BIOGEL PI INDICATOR 7.5 (GLOVE) ×1
GLOVE SS N UNI LF 6.5 STRL (GLOVE) ×2 IMPLANT
GOWN STRL REUS W/ TWL LRG LVL3 (GOWN DISPOSABLE) IMPLANT
GOWN STRL REUS W/ TWL XL LVL3 (GOWN DISPOSABLE) ×1 IMPLANT
GOWN STRL REUS W/TWL 2XL LVL3 (GOWN DISPOSABLE) IMPLANT
GOWN STRL REUS W/TWL LRG LVL3 (GOWN DISPOSABLE) ×4
GOWN STRL REUS W/TWL XL LVL3 (GOWN DISPOSABLE) ×2
HEMOSTAT POWDER KIT SURGIFOAM (HEMOSTASIS) ×1 IMPLANT
KIT BASIN OR (CUSTOM PROCEDURE TRAY) ×2 IMPLANT
KIT ROOM TURNOVER OR (KITS) ×2 IMPLANT
NDL HYPO 21X1.5 SAFETY (NEEDLE) IMPLANT
NEEDLE HYPO 21X1.5 SAFETY (NEEDLE) ×2 IMPLANT
NEEDLE HYPO 22GX1.5 SAFETY (NEEDLE) ×2 IMPLANT
NS IRRIG 1000ML POUR BTL (IV SOLUTION) ×2 IMPLANT
OIL CARTRIDGE MAESTRO DRILL (MISCELLANEOUS) ×2
PACK LAMINECTOMY NEURO (CUSTOM PROCEDURE TRAY) ×2 IMPLANT
PAD ARMBOARD 7.5X6 YLW CONV (MISCELLANEOUS) ×6 IMPLANT
RUBBERBAND STERILE (MISCELLANEOUS) ×4 IMPLANT
SPONGE GAUZE 4X4 12PLY STER LF (GAUZE/BANDAGES/DRESSINGS) ×1 IMPLANT
SPONGE SURGIFOAM ABS GEL 100 (HEMOSTASIS) ×1 IMPLANT
STRIP CLOSURE SKIN 1/2X4 (GAUZE/BANDAGES/DRESSINGS) ×2 IMPLANT
SUT VIC AB 1 CT1 18XBRD ANBCTR (SUTURE) ×2 IMPLANT
SUT VIC AB 1 CT1 8-18 (SUTURE) ×4
SUT VIC AB 2-0 CP2 18 (SUTURE) ×4 IMPLANT
TAPE CLOTH SURG 4X10 WHT LF (GAUZE/BANDAGES/DRESSINGS) ×1 IMPLANT
TOWEL OR 17X24 6PK STRL BLUE (TOWEL DISPOSABLE) ×2 IMPLANT
TOWEL OR 17X26 10 PK STRL BLUE (TOWEL DISPOSABLE) ×2 IMPLANT
WATER STERILE IRR 1000ML POUR (IV SOLUTION) ×2 IMPLANT

## 2016-07-13 NOTE — Anesthesia Preprocedure Evaluation (Addendum)
Anesthesia Evaluation  Patient identified by MRN, date of birth, ID band Patient awake    Reviewed: Allergy & Precautions, NPO status , Patient's Chart, lab work & pertinent test results  Airway Mallampati: II  TM Distance: >3 FB Neck ROM: Full    Dental no notable dental hx. (+) Teeth Intact, Edentulous Upper, Partial Lower, Dental Advisory Given   Pulmonary neg pulmonary ROS, former smoker,    Pulmonary exam normal breath sounds clear to auscultation       Cardiovascular hypertension, Pt. on medications + CAD, + Past MI, + CABG and + Peripheral Vascular Disease  Normal cardiovascular exam Rhythm:Regular Rate:Normal     Neuro/Psych negative neurological ROS  negative psych ROS   GI/Hepatic negative GI ROS, Neg liver ROS, hiatal hernia, GERD  Medicated and Controlled,  Endo/Other  negative endocrine ROSdiabetes  Renal/GU negative Renal ROS  negative genitourinary   Musculoskeletal negative musculoskeletal ROS (+) Arthritis , Osteoarthritis,    Abdominal   Peds negative pediatric ROS (+)  Hematology negative hematology ROS (+)   Anesthesia Other Findings   Reproductive/Obstetrics negative OB ROS                            Anesthesia Physical Anesthesia Plan  ASA: III  Anesthesia Plan: General   Post-op Pain Management:    Induction: Intravenous  Airway Management Planned: Oral ETT  Additional Equipment:   Intra-op Plan:   Post-operative Plan: Extubation in OR  Informed Consent: I have reviewed the patients History and Physical, chart, labs and discussed the procedure including the risks, benefits and alternatives for the proposed anesthesia with the patient or authorized representative who has indicated his/her understanding and acceptance.   Dental advisory given  Plan Discussed with: CRNA, Surgeon and Anesthesiologist  Anesthesia Plan Comments:        Anesthesia  Quick Evaluation

## 2016-07-13 NOTE — Op Note (Signed)
Brief history: The patient is a 75 year old white male who has complained of back, buttock and leg pain consistent with neurogenic claudication. He has failed medical management and was worked up with a lumbar myelo CT. This demonstrated the patient had spinal stenosis at L2-3, L3-4 and L4-5. I discussed the various treatment options with the patient. He has weighed the risks, benefits, and alternatives to surgery and decided to proceed with a lumbar laminectomy.  Preoperative diagnosis: L2-3, L3-4 and L4-5 spinal stenosis, lumbago, lumbar radiculopathy, neurogenic claudication  Postoperative diagnosis: The same  Procedure: L2-3, L3-4 and L4-5 laminectomy/laminotomy/foraminotomy to decompress the bilateral L3, L4 and L5 nerve roots   Surgeon: Dr. Earle Gell  Asst.: None  Anesthesia: Gen. endotracheal  Estimated blood loss: 200 mL  Drains: One 10 mm flat Jackson-Pratt drain in the epidural space.   Complications: None  Description of procedure: The patient was brought to the operating room by the anesthesia team. General endotracheal anesthesia was induced. The patient was turned to the prone position on the Wilson frame. The patient's lumbosacral region was then prepared with Betadine scrub and Betadine solution. Sterile drapes were applied.  I then injected the area to be incised with Marcaine with epinephrine solution. I then used a scalpel to make a linear midline incision over the L2-3, L3-4 and L4-5 intervertebral disc space. I then used electrocautery to perform a lateral subperiosteal dissection exposing the spinous process and lamina of L2, L3, L4 and L5. We obtained intraoperative radiograph to confirm our location. I then inserted the [facility] retractor for exposure.  I incised the interspinous ligament at L2-3, L3-4 and L4-5 with the scalpel. I used the Leksell rongeur to remove the spinous process of L3 and L4.  I used a high-speed drill to perform a laminotomy at  bilaterally at L2-3, L3-4 and L4-5. I then used a Kerrison punches to complete the L3 and L4 laminectomy and to widen the bilateral laminotomies at L2-3 and removed the ligamentum flavum at L2-3, L3-4 and L4-5. We then used microdissection to free up the thecal sac and the bilateral L3, L4 and L5 nerve root from the epidural tissue. I then used a Kerrison punch to perform a foraminotomy at about the bilateral L3, L4 and L5 nerve root. We inspected the intervertebral discs bilaterally at L2-3, L3-4 and L4-5. There were no significant herniations.  I then palpated along the ventral surface of the thecal sac and along exit route of the bilateral L3, L4 and L5 nerve root and noted that the neural structures were well decompressed. This completed the decompression.  We then obtained hemostasis using bipolar electrocautery. We irrigated the wound out with bacitracin solution. I placed a 10 mm flat Jackson-Pratt drain in the epidural space and tunneled it out through a separate stab wound. We then removed the retractor. We then reapproximated the patient's thoracolumbar fascia with interrupted #1 Vicryl suture. We then reapproximated the patient's subcutaneous tissue with interrupted 2-0 Vicryl suture. We then reapproximated patient's skin with Steri-Strips and benzoin. The was then coated with bacitracin ointment. The drapes were removed. The patient was subsequently returned to the supine position where they were extubated by the anesthesia team. The patient was then transported to the postanesthesia care unit in stable condition. All sponge instrument and needle counts were reportedly correct at the end of this case.

## 2016-07-13 NOTE — Transfer of Care (Signed)
Immediate Anesthesia Transfer of Care Note  Patient: [name]  Procedure(s) Performed: Procedure(s) with comments: LUMBAR TWO-THREE, LUMBAR THREE-FOUR, LUMBAR FOUR-FIVE LAMINECTOMY AND FORAMINOTOMY ([name]) - LAMINECTOMY AND FORAMINOTOMY L2-L3, L3-L4,L4-L5  Patient Location: PACU  Anesthesia Type:General  Level of Consciousness: awake, alert , oriented and patient cooperative  Airway & Oxygen Therapy: Patient Spontanous Breathing and Patient connected to face mask oxygen  Post-op Assessment: Report given to RN, Post -op Vital signs reviewed and stable and Patient moving all extremities X 4  Post vital signs: Reviewed and stable  Last Vitals:  Vitals:   07/13/16 0751 07/13/16 1255  BP: (!) 152/65   Pulse: 66   Resp: 18   Temp: 36.5 C (P) 36.4 C    Last Pain:  Vitals:   07/13/16 0825  TempSrc:   PainSc: 5          Complications: No apparent anesthesia complications

## 2016-07-13 NOTE — Anesthesia Procedure Notes (Signed)
Procedure Name: Intubation Date/Time: 07/13/2016 10:04 AM Performed by: Carney Living Pre-anesthesia Checklist: Patient identified, Emergency Drugs available, Suction available, Patient being monitored and Timeout performed Patient Re-evaluated:Patient Re-evaluated prior to inductionOxygen Delivery Method: Circle system utilized Preoxygenation: Pre-oxygenation with 100% oxygen Intubation Type: IV induction Ventilation: Mask ventilation without difficulty Laryngoscope Size: Mac and 4 Grade View: Grade I Tube type: Oral Tube size: 7.5 mm Number of attempts: 1 Airway Equipment and Method: Stylet Placement Confirmation: ETT inserted through vocal cords under direct vision,  positive ETCO2 and breath sounds checked- equal and bilateral Secured at: 22 cm Tube secured with: Tape Dental Injury: Teeth and Oropharynx as per pre-operative assessment

## 2016-07-13 NOTE — Anesthesia Postprocedure Evaluation (Signed)
Anesthesia Post Note  Patient: [name]  Procedure(s) Performed: Procedure(s) (LRB): LUMBAR TWO-THREE, LUMBAR THREE-FOUR, LUMBAR FOUR-FIVE LAMINECTOMY AND FORAMINOTOMY ([name])  Patient location during evaluation: PACU Anesthesia Type: General Level of consciousness: awake and alert Pain management: pain level controlled Vital Signs Assessment: post-procedure vital signs reviewed and stable Respiratory status: spontaneous breathing, nonlabored ventilation, respiratory function stable and patient connected to nasal cannula oxygen Cardiovascular status: blood pressure returned to baseline and stable Postop Assessment: no signs of nausea or vomiting Anesthetic complications: no    Last Vitals:  Vitals:   07/13/16 1309 07/13/16 1430  BP: (!) 156/70   Pulse:    Resp:    Temp:  36.4 C    Last Pain:  Vitals:   07/13/16 1400  TempSrc:   PainSc: 10-Worst pain ever      LLE Sensation: Full sensation (07/13/16 1430)   RLE Sensation: Full sensation (07/13/16 1430)      Evie Crumpler S

## 2016-07-13 NOTE — Progress Notes (Signed)
Patient ID: [name], male   DOB: January 25, 1941, 75 y.o.   MRN: [id number] Subjective:  The patient is alert and pleasant. He looks well. He is in no apparent distress.  Objective: Vital signs in last 24 hours: Temp:  [97.5 F (36.4 C)-97.7 F (36.5 C)] 97.5 F (36.4 C) (11/29 1255) Pulse Rate:  [66-81] 76 (11/29 1308) Resp:  [8-18] 8 (11/29 1308) BP: (152-173)/(65-72) 156/70 (11/29 1309) SpO2:  [94 %-98 %] 98 % (11/29 1308) Weight:  [100.7 kg (222 lb)] 100.7 kg (222 lb) (11/29 0751)  Intake/Output from previous day: No intake/output data recorded. Intake/Output this shift: Total I/O In: 1200 [I.V.:1200] Out: 300 [Blood:300]  Physical exam the patient is alert and pleasant. He is moving his lower extremities well.  Lab Results: No results for input(s): WBC, HGB, HCT, PLT in the last 72 hours. BMET No results for input(s): NA, K, CL, CO2, GLUCOSE, BUN, CREATININE, CALCIUM in the last 72 hours.  Studies/Results: Dg Lumbar Spine 1 View  Result Date: 07/13/2016 CLINICAL DATA:  Laminectomy . EXAM: LUMBAR SPINE - 1 VIEW COMPARISON:  CT 04/21/2016. FINDINGS: Lumbar vertebra numbered with the lowest segmented appearing lumbar shaped vertebral lateral view as L5. Metallic marker noted posteriorly at L3-L4. No acute bony abnormality identified . Aortoiliac atherosclerotic vascular disease. IMPRESSION: Metallic marker noted posteriorly at L3-L4. Electronically Signed   By: Marcello Moores  Register   On: 07/13/2016 12:38    Assessment/Plan: The patient is doing well. I spoke with his family.  LOS: 0 days     [name] D 07/13/2016, 1:18 PM

## 2016-07-13 NOTE — H&P (Signed)
Subjective: The patient is a 75 year old white male who has complained of back, buttock and leg pain consistent with neurogenic claudication. He has failed medical management and was worked up with a lumbar MRI. This demonstrated the patient has spinal stenosis at L2-3, L3-4 and L4-5. I discussed the various treatment options with the patient. He has decided to proceed with a laminectomy.  Past Medical History:  Diagnosis Date  . BENIGN PROSTATIC HYPERTROPHY, WITH URINARY OBSTRUCTION 09/06/2007  . 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 Diabetes mellitus without complication ([place])    diet controled per dr note  . 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 DVT, HX OF 1998  . GERD 04/30/2007  . Headache    hx migraines  . History of hiatal hernia   . History of shingles   . HYPERLIPIDEMIA 04/26/2007  . HYPERTENSION 04/30/2007  . INGUINAL HERNIA, RIGHT 05/26/2010  . LOC OSTEOARTHROS NOT SPEC PRIM/SEC LOWER LEG 11/05/2009  . Lumbar disc disease with radiculopathy   . Myocardial infarction 1989  . OSTEOARTHRITIS 04/26/2007  . 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 Pneumonia 07/2014   [facility] hospitalization  . PSA, INCREASED 07/09/2008  . Spinal stenosis of lumbar region     Past Surgical History:  Procedure Laterality Date  . APPENDECTOMY     rupture  . CARDIAC CATHETERIZATION [name] 07/07/2015   Procedure: Left Heart Cath and Cors/Grafts Angiography;  Surgeon: [name], MD;  Location: [place] [facility];  Service: Cardiovascular;  Laterality: [name];  . CATARACT EXTRACTION, BILATERAL    . COLONOSCOPY  03/2010   HP polyp, diverticulosis, rpt 10 yrs ([name])  . CORONARY ARTERY BYPASS GRAFT  1990   3 vessel   . CYSTOSCOPY  12/23/10   [facility]  . KNEE ARTHROSCOPY Right    x2  . LAMINOTOMY  1986   L5/S1 lumbar laminotomy for two ruptured discs/fusion  . TOTAL HIP ARTHROPLASTY Bilateral 1999,2000     Allergies  Allergen Reactions  . Morphine Nausea Only and Other (See Comments)    Irritability   . Vioxx [Rofecoxib] Other (See Comments)    Bleeding out    Social History  Substance Use Topics  . Smoking status: Former Smoker    Packs/day: 1.00    Years: 25.00    Types: Cigarettes    Quit date: 08/15/1988  . Smokeless tobacco: Never Used  . Alcohol use No    Family History  Problem Relation Age of Onset  . Stroke Mother   . Heart attack Mother   . Diabetes Mother   . Stroke Father   . Heart disease Father   . Kidney disease Father     PCKD  . Cancer Sister     throat  . Diabetes Brother   . Kidney disease Sister     PCKD  . Cancer Other     5/7 nephews with lung cancer   Prior to Admission medications   Medication Sig Start Date End Date Taking? Authorizing Provider  acetaminophen (TYLENOL) 325 MG tablet Take 650 mg by mouth every 6 (six) hours as needed.   Yes Historical Provider, MD  aspirin 81 MG tablet Take 81 mg by mouth daily.    Yes Historical Provider, MD  ezetimibe (ZETIA) 10 MG tablet Take 10 mg by mouth daily.   Yes Historical Provider, MD  finasteride (PROSCAR) 5 MG tablet Take 5 mg by mouth daily.  Yes Historical Provider, MD  HYDROcodone-acetaminophen (NORCO/VICODIN) 5-325 MG tablet Take 1 tablet by mouth every 6 (six) hours as needed for moderate pain.   Yes Historical Provider, MD  isosorbide mononitrate (IMDUR) 30 MG 24 hr tablet Take 1 tablet (30 mg total) by mouth daily. 04/05/16  Yes [name], MD  nitroGLYCERIN (NITROSTAT) 0.4 MG SL tablet Place 0.4 mg under the tongue every 5 (five) minutes as needed for chest pain.   Yes Historical Provider, MD  pantoprazole (PROTONIX) 40 MG tablet Take 40 mg by mouth daily.   Yes Historical Provider, MD  simvastatin (ZOCOR) 40 MG tablet Take 40 mg by mouth daily. 05/17/16  Yes Historical Provider, MD  solifenacin (VESICARE) 10 MG tablet Take 10 mg by mouth daily.   Yes Historical Provider, MD  vitamin  B-12 (CYANOCOBALAMIN) 1000 MCG tablet Take 1 tablet (1,000 mcg total) by mouth daily. 01/01/16  Yes Ria Bush, MD     Review of Systems  Positive ROS: As above  All other systems have been reviewed and were otherwise negative with the exception of those mentioned in the HPI and as above.  Objective: Vital signs in last 24 hours: Temp:  [97.7 F (36.5 C)] 97.7 F (36.5 C) (11/29 0751) Pulse Rate:  [66] 66 (11/29 0751) Resp:  [18] 18 (11/29 0751) BP: (152)/(65) 152/65 (11/29 0751) SpO2:  [94 %] 94 % (11/29 0751) Weight:  [100.7 kg (222 lb)] 100.7 kg (222 lb) (11/29 0751)  General Appearance: Alert, cooperative, no distress, Head: Normocephalic, without obvious abnormality, atraumatic Eyes: PERRL, conjunctiva/corneas clear, EOM's intact,    Ears: Normal  Throat: Normal  Neck: Supple, symmetrical, trachea midline, no adenopathy; thyroid: No enlargement/tenderness/nodules; no carotid bruit or JVD Back: Symmetric, no curvature, ROM normal, no CVA tenderness Lungs: Clear to auscultation bilaterally, respirations unlabored Heart: Regular rate and rhythm, no murmur, rub or gallop Abdomen: Soft, non-tender,, no masses, no organomegaly Extremities: Extremities normal, atraumatic, no cyanosis or edema Pulses: 2+ and symmetric all extremities Skin: Skin color, texture, turgor normal, no rashes or lesions  NEUROLOGIC:   Mental status: alert and oriented, no aphasia, good attention span, Fund of knowledge/ memory ok Motor Exam - grossly normal Sensory Exam - grossly normal Reflexes:  Coordination - grossly normal Gait - grossly normal Balance - grossly normal Cranial Nerves: I: smell Not tested  II: visual acuity  OS: Normal  OD: Normal   II: visual fields Full to confrontation  II: pupils Equal, round, reactive to light  III,VII: ptosis None  III,IV,VI: extraocular muscles  Full ROM  V: mastication Normal  V: facial light touch sensation  Normal  V,VII: corneal reflex   Present  VII: facial muscle function - upper  Normal  VII: facial muscle function - lower Normal  VIII: hearing Not tested  IX: soft palate elevation  Normal  IX,X: gag reflex Present  XI: trapezius strength  5/5  XI: sternocleidomastoid strength 5/5  XI: neck flexion strength  5/5  XII: tongue strength  Normal    Data Review Lab Results  Component Value Date   WBC 5.8 07/04/2016   HGB 14.9 07/04/2016   HCT 43.5 07/04/2016   MCV 93.9 07/04/2016   PLT 215.0 07/04/2016   Lab Results  Component Value Date   NA 140 07/04/2016   K 4.5 07/04/2016   CL 103 07/04/2016   CO2 29 07/04/2016   BUN 21 07/04/2016   CREATININE 1.22 07/04/2016   GLUCOSE 131 (H) 07/04/2016   Lab Results  Component Value Date   INR 1.1 (H) 07/04/2016    Assessment/Plan: L2-3, L3-4 and L4-5 spinal stenosis, lumbago, lumbar radiculopathy, neurogenic claudication: I have discussed the situation with the patient and reviewed his imaging studies with him. We have discussed the various treatment options including surgery. I have described the surgical treatment option of an L2-3, L3-4 and L4-5 laminectomy/laminotomy/foraminotomy. I have shown him surgical models. We have discussed the risks, benefits, alternatives, expected postoperative course, and likelihood of achieving her goals with surgery. I have answered all his questions. He has decided to proceed with surgery.   Alana Dayton D 07/13/2016 9:46 AM

## 2016-07-13 NOTE — Progress Notes (Signed)
Pt received from PACU with no noted distress. Pt stable, neuro intact. Surgical gauze dressing dry and intact. Pt oriented to room. Safety measures in place. Call bell within reach. Wife at bedside.  Will continue to monitor.

## 2016-07-14 ENCOUNTER — Encounter (HOSPITAL_COMMUNITY): Payer: Self-pay | Admitting: Neurosurgery

## 2016-07-14 DIAGNOSIS — M48062 Spinal stenosis, lumbar region with neurogenic claudication: Secondary | ICD-10-CM | POA: Diagnosis not present

## 2016-07-14 LAB — CBC
HCT: 36 % — ABNORMAL LOW (ref 39.0–52.0)
Hemoglobin: 12.3 g/dL — ABNORMAL LOW (ref 13.0–17.0)
MCH: 31.8 pg (ref 26.0–34.0)
MCHC: 34.2 g/dL (ref 30.0–36.0)
MCV: 93 fL (ref 78.0–100.0)
Platelets: 163 10*3/uL (ref 150–400)
RBC: 3.87 MIL/uL — ABNORMAL LOW (ref 4.22–5.81)
RDW: 12.4 % (ref 11.5–15.5)
WBC: 6.7 10*3/uL (ref 4.0–10.5)

## 2016-07-14 LAB — BASIC METABOLIC PANEL
Anion gap: 7 (ref 5–15)
BUN: 13 mg/dL (ref 6–20)
CO2: 25 mmol/L (ref 22–32)
Calcium: 8.3 mg/dL — ABNORMAL LOW (ref 8.9–10.3)
Chloride: 102 mmol/L (ref 101–111)
Creatinine, Ser: 1.12 mg/dL (ref 0.61–1.24)
GFR calc Af Amer: >60 mL/min (ref >60)
GFR calc non Af Amer: >60 mL/min (ref >60)
Glucose, Bld: 136 mg/dL — ABNORMAL HIGH (ref 65–99)
Potassium: 3.8 mmol/L (ref 3.5–5.1)
Sodium: 134 mmol/L — ABNORMAL LOW (ref 135–145)

## 2016-07-14 NOTE — Care Management Obs Status (Signed)
[place] NOTIFICATION   Patient Details  Name: EDRIN SAMPLEY MRN: [id number] Date of Birth: June 11, 1941   Medicare Observation Status Notification Given:  Yes    Pollie Friar, RN 07/14/2016, 2:53 PM

## 2016-07-14 NOTE — Evaluation (Signed)
Physical Therapy Evaluation Patient Details Name: [name] MRN: [id number] DOB: Dec 18, 1940 Today's Date: 07/14/2016   History of Present Illness  s/p L2-5 bil laminectomy/foraminotomy  Clinical Impression  Patient is s/p above surgery resulting in the deficits listed below (see PT Problem List). Patient required minimal education related to use of RW and proper techniques to maintain back precautions. Patient will benefit from skilled PT to increase their independence and safety with mobility (while adhering to their precautions) to allow discharge to the venue listed below.     Follow Up Recommendations No PT follow up;Supervision for mobility/OOB    Equipment Recommendations  Rolling walker with 5" wheels    Recommendations for Other Services OT consult     Precautions / Restrictions Precautions Precautions: Back Precaution Booklet Issued: Yes (comment) Precaution Comments: pt able to recall 3/3 at end of session Required Braces or Orthoses:  (none)      Mobility  Bed Mobility               General bed mobility comments: pt up in chair; able to verbalize correct technique for in/out of bed  Transfers Overall transfer level: Needs assistance   Transfers: Sit to/from Stand Sit to Stand: Supervision         General transfer comment: for safety; correct technique  Ambulation/Gait Ambulation/Gait assistance: Supervision Ambulation Distance (Feet): 150 Feet Assistive device: Rolling walker (2 wheeled) Gait Pattern/deviations: Step-through pattern;Decreased stride length   Gait velocity interpretation: Below normal speed for age/gender General Gait Details: vc for proximity to RW  Stairs Stairs:  (pt verbalized correct technique ascend; educated on descendi)          Wheelchair Mobility    Modified Rankin (Stroke Patients Only)       Balance                                             Pertinent Vitals/Pain Pain  Assessment: 0-10 Pain Score: 4  Pain Location: back Pain Descriptors / Indicators: Operative site guarding Pain Intervention(s): Limited activity within patient's tolerance;Monitored during session;Premedicated before session;Repositioned    Home Living Family/patient expects to be discharged to:: Private residence Living Arrangements: Spouse/significant other (wife) Available Help at Discharge: Family;Available 24 hours/day Type of Home: House Home Access: Stairs to enter Entrance Stairs-Rails: Right Entrance Stairs-Number of Steps: 2 Home Layout: One level Home Equipment: Grab bars - tub/shower;Hand held shower head;Cane - single point;Shower seat      Prior Function Level of Independence: Independent         Comments: small farm; retired [employer] forces logistics/support/administrative officer        Extremity/Trunk Assessment   Upper Extremity Assessment: Overall WFL for tasks assessed           Lower Extremity Assessment: Overall WFL for tasks assessed      Cervical / Trunk Assessment: Normal (slight kyphosis)  Communication   Communication: No difficulties  Cognition Arousal/Alertness: Awake/alert Behavior During Therapy: WFL for tasks assessed/performed Overall Cognitive Status: Within Functional Limits for tasks assessed                      General Comments      Exercises     Assessment/Plan    PT Assessment Patient needs continued PT services  PT Problem List Decreased mobility;Decreased knowledge of use of DME;Decreased knowledge  of precautions;Pain          PT Treatment Interventions DME instruction;Gait training;Stair training;Functional mobility training;Patient/family education    PT Goals (Current goals can be found in the Care Plan section)  Acute Rehab PT Goals Patient Stated Goal: not to overdo it this time PT Goal Formulation: With patient Time For Goal Achievement: 07/19/16 Potential to Achieve Goals: Good    Frequency Min 5X/week    Barriers to discharge        Co-evaluation               End of Session Equipment Utilized During Treatment: Gait belt Activity Tolerance: Patient tolerated treatment well Patient left: in chair;with call bell/phone within reach;with chair alarm set Nurse Communication: Mobility status    Functional Assessment Tool Used: clinical judgement Functional Limitation: Mobility: Walking and moving around Mobility: Walking and Moving Around Current Status [id number]): At least 1 percent but less than 20 percent impaired, limited or restricted Mobility: Walking and Moving Around Goal Status [phone]): At least 1 percent but less than 20 percent impaired, limited or restricted    Time: 0833-0903 PT Time Calculation (min) (ACUTE ONLY): 30 min   Charges:   PT Evaluation $PT Eval Low Complexity: 1 Procedure PT Treatments $Gait Training: 8-22 mins   PT G Codes:   PT G-Codes **NOT FOR INPATIENT CLASS** Functional Assessment Tool Used: clinical judgement Functional Limitation: Mobility: Walking and moving around Mobility: Walking and Moving Around Current Status [id number]): At least 1 percent but less than 20 percent impaired, limited or restricted Mobility: Walking and Moving Around Goal Status [phone]): At least 1 percent but less than 20 percent impaired, limited or restricted    Rexanne Mano 07/14/2016, 9:13 AM Pager [phone]

## 2016-07-15 DIAGNOSIS — M48062 Spinal stenosis, lumbar region with neurogenic claudication: Secondary | ICD-10-CM | POA: Diagnosis not present

## 2016-07-15 MED ORDER — CYCLOBENZAPRINE HCL 10 MG PO TABS: 10.0000 mg | ORAL_TABLET | Freq: Three times a day (TID) | ORAL | 1 refills | Status: DC | PRN

## 2016-07-15 MED ORDER — CYCLOBENZAPRINE HCL 10 MG PO TABS: 10.0000 mg | ORAL_TABLET | Freq: Three times a day (TID) | ORAL | Status: DC | PRN

## 2016-07-15 MED ORDER — DOCUSATE SODIUM 100 MG PO CAPS: 100.0000 mg | ORAL_CAPSULE | Freq: Two times a day (BID) | ORAL | 0 refills | Status: DC

## 2016-07-15 MED ORDER — OXYCODONE-ACETAMINOPHEN 5-325 MG PO TABS: 1.0000 | ORAL_TABLET | ORAL | 0 refills | Status: DC | PRN

## 2016-07-15 NOTE — Discharge Summary (Signed)
Physician Discharge Summary  Patient ID: [name] MRN: [id number] DOB/AGE: 04-27-1941 75 y.o.  Admit date: 07/13/2016 Discharge date: 07/15/2016  Admission Diagnoses:L2-3, L3-4 and L4-5 spinal stenosis, lumbago, lumbar radiculopathy, neurogenic claudication  Discharge Diagnoses: The same Active Problems:   Lumbar stenosis with neurogenic claudication   Discharged Condition: good  Hospital Course: I performed an L2-3, L3-4 and L4-5 laminectomy/laminotomy/foraminotomy on the patient on 07/13/2016. The surgery went well.  The patient's postoperative course was unremarkable. On postoperative day #2 the patient requested discharge to home. He was given written and oral discharge instructions. All his questions were answered.  Consults: Physical therapy Significant Diagnostic Studies: None Treatments: L2-3, L3-4, L4-5 laminectomy/laminotomy/foraminotomy Discharge Exam: Blood pressure (!) 144/57, pulse 89, temperature 98.5 F (36.9 C), temperature source Oral, resp. rate 20, weight 100.7 kg (222 lb), SpO2 97 %. The patient is alert and pleasant. He looks well. His strength is grossly normal his lower extremities.  Disposition: Home  Discharge Instructions    Call MD for:  difficulty breathing, headache or visual disturbances    Complete by:  As directed    Call MD for:  extreme fatigue    Complete by:  As directed    Call MD for:  hives    Complete by:  As directed    Call MD for:  persistant dizziness or light-headedness    Complete by:  As directed    Call MD for:  persistant nausea and vomiting    Complete by:  As directed    Call MD for:  redness, tenderness, or signs of infection (pain, swelling, redness, odor or green/yellow discharge around incision site)    Complete by:  As directed    Call MD for:  severe uncontrolled pain    Complete by:  As directed    Call MD for:  temperature >100.4    Complete by:  As directed    Diet - low sodium heart healthy     Complete by:  As directed    Discharge instructions    Complete by:  As directed    Call [phone] for a followup appointment. Take a stool softener while you are using pain medications.   Driving Restrictions    Complete by:  As directed    Do not drive for 2 weeks.   Increase activity slowly    Complete by:  As directed    Lifting restrictions    Complete by:  As directed    Do not lift more than 5 pounds. No excessive bending or twisting.   May shower / Bathe    Complete by:  As directed    He may shower after the pain she is removed 3 days after surgery. Leave the incision alone.   Remove dressing in 24 hours    Complete by:  As directed        Medication List    STOP taking these medications   acetaminophen 325 MG tablet Commonly known as:  TYLENOL   HYDROcodone-acetaminophen 5-325 MG tablet Commonly known as:  NORCO/VICODIN     TAKE these medications   aspirin 81 MG tablet Take 81 mg by mouth daily.   cyclobenzaprine 10 MG tablet Commonly known as:  FLEXERIL Take 1 tablet (10 mg total) by mouth 3 (three) times daily as needed for muscle spasms.   docusate sodium 100 MG capsule Commonly known as:  COLACE Take 1 capsule (100 mg total) by mouth 2 (two) times daily.   ezetimibe 10 MG  tablet Commonly known as:  ZETIA Take 10 mg by mouth daily.   finasteride 5 MG tablet Commonly known as:  PROSCAR Take 5 mg by mouth daily.   isosorbide mononitrate 30 MG 24 hr tablet Commonly known as:  IMDUR Take 1 tablet (30 mg total) by mouth daily.   nitroGLYCERIN 0.4 MG SL tablet Commonly known as:  NITROSTAT Place 0.4 mg under the tongue every 5 (five) minutes as needed for chest pain.   oxyCODONE-acetaminophen 5-325 MG tablet Commonly known as:  PERCOCET/ROXICET Take 1-2 tablets by mouth every 4 (four) hours as needed for moderate pain.   pantoprazole 40 MG tablet Commonly known as:  PROTONIX Take 40 mg by mouth daily.   simvastatin 40 MG tablet Commonly  known as:  ZOCOR Take 40 mg by mouth daily.   solifenacin 10 MG tablet Commonly known as:  VESICARE Take 10 mg by mouth daily.   vitamin B-12 1000 MCG tablet Commonly known as:  CYANOCOBALAMIN Take 1 tablet (1,000 mcg total) by mouth daily.        SignedOphelia Charter 07/15/2016, 7:39 AM

## 2016-07-15 NOTE — Progress Notes (Signed)
Pt discharging at this time with wife taking all personal belongings. Surgical site, dry and intact. IV discontinued, dry dressing applied. Discharge instructions with prescription provided with verbal understanding. Pt to follow up per Md summary. No complaints of pain or discomfort.

## 2016-07-15 NOTE — Care Management Note (Signed)
Case Management Note  Patient Details  Name: [name] MRN: [id number] Date of Birth: [birth year]  Subjective/Objective:                    Action/Plan: Pt discharging home with self care and his wife. Pt with orders for rolling walker with seat. Larene Beach with [facility] DME notified and will deliver the equipment to the room. Pts wife providing transportation home.   Expected Discharge Date:                  Expected Discharge Plan:  Home/Self Care  In-House Referral:     Discharge planning Services  CM Consult  Post Acute Care Choice:  Durable Medical Equipment Choice offered to:  Patient  DME Arranged:  Walker rolling with seat DME Agency:  [place]:    [place] Agency:     Status of Service:  Completed, signed off  If discussed at [place] of Stay Meetings, dates discussed:    Additional Comments:  Pollie Friar, RN 07/15/2016, 11:30 AM

## 2016-07-15 NOTE — Progress Notes (Signed)
Oxygen removed per Md order pt o2 sat 98%. Pt denies shortness of breath or dyspnea. Sitting up in chair waiting for breakfast. Call bell within reach. Will continue to monitor.

## 2016-07-15 NOTE — Progress Notes (Signed)
Physical Therapy Treatment Patient Details Name: [name] MRN: [id number] DOB: 1940/09/12 Today's Date: 07/15/2016    History of Present Illness s/p L2-5 bil laminectomy/foraminotomy    PT Comments    Patient mobilizing with RW with intermittent cues for upright posture (tends to flex in upper back/kyphosis). Educated in ascend/descend safely (due to RLE weakness with prior Rt knee bucklling). No further acute PT needs and plans to d/c today.   Follow Up Recommendations  No PT follow up;Supervision for mobility/OOB     Equipment Recommendations  Rolling walker with 5" wheels    Recommendations for Other Services OT consult     Precautions / Restrictions Precautions Precautions: Back Precaution Comments: pt able to recall 3/3 at end of session Required Braces or Orthoses:  (none)    Mobility  Bed Mobility               General bed mobility comments: pt up in chair; able to verbalize correct technique for in/out of bed  Transfers Overall transfer level: Modified independent                  Ambulation/Gait Ambulation/Gait assistance: Supervision Ambulation Distance (Feet): 220 Feet Assistive device: Rolling walker (2 wheeled);None Gait Pattern/deviations: Step-through pattern;Decreased stride length   Gait velocity interpretation: Below normal speed for age/gender General Gait Details: vc for proximity to RW and upright posture; attempted with no RW and pt felt he was too unsteady   Stairs Stairs: Yes Stairs assistance: Min guard Stair Management: One rail Right;Step to pattern;Forwards Number of Stairs: 5 (x2) General stair comments: patient educated in descending with "weaker leg (RLE)" first; he agreed this felt more secure  Wheelchair Mobility    Modified Rankin (Stroke Patients Only)       Balance                                    Cognition Arousal/Alertness: Awake/alert Behavior During Therapy: WFL for tasks  assessed/performed Overall Cognitive Status: Within Functional Limits for tasks assessed                      Exercises      General Comments        Pertinent Vitals/Pain Pain Assessment: 0-10 Pain Score: 4  Pain Location: back Pain Descriptors / Indicators: Operative site guarding Pain Intervention(s): Limited activity within patient's tolerance;Monitored during session;Repositioned    Home Living                      Prior Function            PT Goals (current goals can now be found in the care plan section) Acute Rehab PT Goals Patient Stated Goal: not to overdo it this time PT Goal Formulation: With patient Time For Goal Achievement: 07/19/16 Potential to Achieve Goals: Good Progress towards PT goals: Progressing toward goals    Frequency    Min 5X/week      PT Plan Current plan remains appropriate    Co-evaluation             End of Session Equipment Utilized During Treatment: Gait belt Activity Tolerance: Patient tolerated treatment well Patient left: in chair;with call bell/phone within reach;with chair alarm set     Time: [id number] PT Time Calculation (min) (ACUTE ONLY): 17 min  Charges:  $Gait Training: 8-22 mins  G CodesJeanie Cooks Dymin Dingledine 07/15/2016, 9:06 AM Pager [phone]

## 2016-09-09 ENCOUNTER — Other Ambulatory Visit: Payer: Self-pay | Admitting: Cardiovascular Disease

## 2016-09-09 ENCOUNTER — Ambulatory Visit: Payer: PPO

## 2016-09-09 DIAGNOSIS — I6523 Occlusion and stenosis of bilateral carotid arteries: Secondary | ICD-10-CM

## 2016-10-06 DIAGNOSIS — Z6829 Body mass index (BMI) 29.0-29.9, adult: Secondary | ICD-10-CM | POA: Diagnosis not present

## 2016-10-06 DIAGNOSIS — M5416 Radiculopathy, lumbar region: Secondary | ICD-10-CM | POA: Diagnosis not present

## 2016-10-06 DIAGNOSIS — M545 Low back pain: Secondary | ICD-10-CM | POA: Diagnosis not present

## 2016-10-25 DIAGNOSIS — Z96642 Presence of left artificial hip joint: Secondary | ICD-10-CM | POA: Diagnosis not present

## 2016-10-25 DIAGNOSIS — Z96641 Presence of right artificial hip joint: Secondary | ICD-10-CM | POA: Diagnosis not present

## 2016-10-25 DIAGNOSIS — Z471 Aftercare following joint replacement surgery: Secondary | ICD-10-CM | POA: Diagnosis not present

## 2016-10-25 DIAGNOSIS — Z96643 Presence of artificial hip joint, bilateral: Secondary | ICD-10-CM | POA: Diagnosis not present

## 2016-10-25 DIAGNOSIS — M1711 Unilateral primary osteoarthritis, right knee: Secondary | ICD-10-CM | POA: Diagnosis not present

## 2016-10-30 ENCOUNTER — Telehealth: Payer: Self-pay | Admitting: Family Medicine

## 2016-10-30 NOTE — Telephone Encounter (Signed)
If no new anginal sx, Would be acceptable risk for surgery

## 2016-10-30 NOTE — Telephone Encounter (Signed)
Received request for preop eval for R TKA by Dr Maureen Ralphs, placed in Kim's box. plz call pt - ensure no new chest pain, dyspnea, lightheadedness, cough, fevers, or new symptoms or concerns. Pt cardiac patient and had cardiac clearance 04/2016 by Dr Rockey Situ for lumbar surgery. Would forward to Dr Donivan Scull office to see if he's willing to clear again - otherwise may make appt with [place] for clearance.  Will cc: Dr Rockey Situ as well.

## 2016-10-31 NOTE — Telephone Encounter (Signed)
Message left for patient to return my call.  

## 2016-10-31 NOTE — Progress Notes (Signed)
Cardiology Office Note  Date:  11/01/2016   ID:  [name], DOB [birth year], MRN [id number]  PCP:  Ria Bush, MD   Chief Complaint  Patient presents with  . other    6 month follow up. Patient states he is doing well. Patient will be having Knee surgery May 14th 2018. Meds reviewed verbally with patient.     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   Completed back surgery for spinal stenosis, 06/20/2016 Scheduled to have  total knee surgery  [name] did back surgery Still with pain Around his tailbone right sacroiliac region Worse when sitting, has to stand up to make pain go away  Lab work reviewed with him in detail HBA1C 6.7 Total cholesterol up slightly from his baseline Eating poorly, No exercise  Denies having any significant chest pain on exertion Recent cortisone shot to the knee, ambulating okay  EKG on today's visit shows normal sinus rhythm with rate 90 bpm, left axis deviation, no significant ST or T wave change  Other past medical history AAA less than 3 cm,  mild bilateral carotid arterial disease estimated at 40-50% bilaterally. Numerous previous episode of chest pain relieved with belching consistent with GI etiology. diagnosis of obstructive sleep apnea. Could not handle CPAP despite numerous attempts. Symptoms better after weight loss total hip replacements x2  Previous cardiac catheterization 07/07/2015  Ost LM to LM lesion, 50% stenosed.  Ost RCA lesion, 80% stenosed.  Prox RCA-1 lesion, 80% stenosed.  Prox RCA-2 lesion, 80% stenosed.  LIMA was injected is moderate in size, and is anatomically normal.  Dist LAD lesion, 60% stenosed.  Ost LAD to  Prox LAD lesion, 100% stenosed.  Mid LAD lesion, 60% stenosed.  The left ventricular systolic function is normal.  SVG was injected is normal in caliber, and is anatomically normal.  2nd Diag lesion, 50% stenosed.  severe RCA disease (small vessel) not amenable to PCI, moderate left main disease, moderate LAD and proximal diagonal disease. --Patent grafts x2 --No intervention performed, started on isosorbide 30 mg daily  went to the emergency room in [facility] 07/03/2013 with chest pain. Had a CT scan of his chest which was essentially benign. Nitroglycerin did not help his pain. He ruled out and was sent home.  Repeat episode of chest pain 08/05/2013 with stress test at that time showing no significant ischemia, ejection fraction 48%. It was felt his chest pain was atypical in nature. She feels it was from GI etiology given his negative stress test.  History of cold induced chest tightness/shortness of breath, asthma.   episode of chest pain 05/14/2013. He felt was indigestion. He took several nitroglycerin with no significant improvement in his symptoms. Symptoms were severe, felt similar to his previous MI. He got in his car and on the way, had a big burp. Symptoms seemed to resolve at that time. He continued on to the emergency room. There cardiac enzymes were negative x2, EKG unchanged. He reports having had prior chest pain episode relieved with belching in the past.   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  PSH:    Past Surgical History:  Procedure Laterality Date  . APPENDECTOMY     rupture  . CARDIAC CATHETERIZATION [name] 07/07/2015   Procedure: Left Heart Cath and Cors/Grafts Angiography;  Surgeon: [name], MD;  Location: [place] [facility];  Service: Cardiovascular;  Laterality: [name];  . CATARACT EXTRACTION, BILATERAL    . COLONOSCOPY  03/2010   HP polyp, diverticulosis, rpt 10 yrs ([name])  . CORONARY ARTERY BYPASS GRAFT  1990   3 vessel   . CYSTOSCOPY  12/23/10   [facility]  . KNEE ARTHROSCOPY Right    x2  . LAMINOTOMY  1986   L5/S1 lumbar laminotomy for two ruptured discs/fusion  . LUMBAR LAMINECTOMY/DECOMPRESSION MICRODISCECTOMY [name] 07/13/2016   Procedure: LUMBAR TWO-THREE, LUMBAR THREE-FOUR, LUMBAR FOUR-FIVE LAMINECTOMY AND FORAMINOTOMY;  Surgeon: [name], MD;  Location: [place];  Service: Neurosurgery;  Laterality: [name];  LAMINECTOMY AND FORAMINOTOMY L2-L3, L3-L4,L4-L5  . TOTAL HIP ARTHROPLASTY Bilateral [id number]    Current Outpatient Prescriptions  Medication Sig Dispense Refill  . aspirin 81 MG tablet Take 81 mg by mouth daily.     . cyclobenzaprine (FLEXERIL) 10 MG tablet Take 1 tablet (10 mg total) by mouth 3 (three) times daily as needed for muscle spasms. 50 tablet 1  . docusate sodium (COLACE) 100 MG capsule Take 1 capsule (100 mg total) by mouth 2 (two) times daily. 60 capsule 0  . ezetimibe (ZETIA) 10 MG tablet Take 10 mg by mouth daily.    . finasteride (PROSCAR) 5 MG tablet Take 5 mg by mouth daily.      . isosorbide mononitrate (IMDUR) 30 MG 24 hr tablet Take 1 tablet (30 mg total) by mouth daily. 90 tablet 3  . nitroGLYCERIN (NITROSTAT) 0.4 MG SL  tablet Place 0.4 mg under the tongue every 5 (five) minutes as needed for chest pain.    Marland Kitchen oxyCODONE-acetaminophen (PERCOCET/ROXICET) 5-325 MG tablet Take 1-2 tablets by mouth every 4 (four) hours as needed for moderate pain. 50 tablet 0  . pantoprazole (PROTONIX) 40 MG tablet Take 40 mg by mouth daily.    . simvastatin (ZOCOR) 40 MG tablet Take 40 mg by mouth daily.    Marland Kitchen tolterodine (DETROL LA) 4 MG 24 hr capsule Take 4 mg by mouth daily.    . vitamin B-12 (CYANOCOBALAMIN) 1000 MCG tablet Take 1 tablet (1,000 mcg total) by mouth daily.     No current facility-administered medications for this visit.      Allergies:   Morphine and Vioxx [rofecoxib]   Social History:  The patient  reports that he quit smoking about 28 years ago. His smoking use included Cigarettes. He has a 25.00 pack-year smoking history. He has never used smokeless tobacco. He reports that he does not drink alcohol or use drugs.   Family History:   family history includes Cancer in his other and sister; Diabetes in his brother and mother; Heart attack in his mother; Heart disease in his father; Kidney disease in his father and sister; Stroke in his father and mother.    Review of Systems: Review of Systems  Constitutional: Negative.   Respiratory: Negative.   Cardiovascular: Negative.   Gastrointestinal: Negative.  Musculoskeletal: Positive for back pain and joint pain.  Neurological: Negative.   Psychiatric/Behavioral: Negative.   All other systems reviewed and are negative.    PHYSICAL EXAM: VS:  BP 132/70 (BP Location: Left Arm, Patient Position: Sitting, Cuff Size: Normal)   Pulse 90   Ht 6' (1.829 m)   Wt 217 lb 8 oz (98.7 kg)   BMI 29.50 kg/m  , BMI Body mass index is 29.5 kg/m. GEN: Well nourished, well developed, in no acute distress  HEENT: normal  Neck: no JVD, carotid bruits, or masses Cardiac: RRR; no murmurs, rubs, or gallops,no edema  Respiratory:  clear to auscultation bilaterally, normal  work of breathing GI: soft, nontender, nondistended, + BS MS: no deformity or atrophy  Skin: warm and dry, no rash Neuro:  Strength and sensation are intact Psych: euthymic mood, full affect    Recent Labs: 07/04/2016: ALT 16 07/14/2016: BUN 13; Creatinine, Ser 1.12; Hemoglobin 12.3; Platelets 163; Potassium 3.8; Sodium 134    Lipid Panel Lab Results  Component Value Date   CHOL 164 07/04/2016   HDL 37.10 (L) 07/04/2016   LDLCALC 96 07/04/2016   TRIG 156.0 (H) 07/04/2016      Wt Readings from Last 3 Encounters:  11/01/16 217 lb 8 oz (98.7 kg)  07/13/16 222 lb (100.7 kg)  07/04/16 222 lb 6 oz (100.9 kg)       ASSESSMENT AND PLAN:  Mixed hyperlipidemia Same meds, no changes Stressed the importance of working on sugars  Essential hypertension Blood pressure is well controlled on today's visit. No changes made to the medications.  Atherosclerosis of coronary artery bypass graft of native heart with stable angina pectoris ([place]) Currently with no symptoms of angina. No further workup at this time. Continue current medication regimen.  Bilateral carotid artery stenosis Mild bilateral carotid disease less than 39% bilaterally  Angina pectoris ([facility]) No sx, no further testing  Diet-controlled diabetes mellitus ([place]) As below recommended dietary changes as hemoglobin A1c running higher than his baseline  Ex-smoker Nonsmoker  Obesity, Class I, BMI 30-34.9 We have encouraged continued careful diet management in an effort to lose weight. HBA1C is elevated  preop cardiovascular Acceptable risk for knee surgery,  No further testing needed   Disposition:   F/U  6 months   Total encounter time more than 25 minutes  Greater than 50% was spent in counseling and coordination of care with the patient   No orders of the defined types were placed in this encounter.    Signed, Esmond Plants, M.D., Ph.D. 11/01/2016  [place],  [place]

## 2016-10-31 NOTE — Telephone Encounter (Signed)
Pt returned call , please call [phone]

## 2016-11-01 ENCOUNTER — Ambulatory Visit (INDEPENDENT_AMBULATORY_CARE_PROVIDER_SITE_OTHER): Payer: PPO | Admitting: Cardiovascular Disease

## 2016-11-01 ENCOUNTER — Encounter: Payer: Self-pay | Admitting: Cardiovascular Disease

## 2016-11-01 VITALS — BP 132/70 | HR 90 | Ht 72.0 in | Wt 217.5 lb

## 2016-11-01 DIAGNOSIS — I25708 Atherosclerosis of coronary artery bypass graft(s), unspecified, with other forms of angina pectoris: Secondary | ICD-10-CM

## 2016-11-01 DIAGNOSIS — E669 Obesity, unspecified: Secondary | ICD-10-CM | POA: Diagnosis not present

## 2016-11-01 DIAGNOSIS — I10 Essential (primary) hypertension: Secondary | ICD-10-CM | POA: Diagnosis not present

## 2016-11-01 DIAGNOSIS — Z01810 Encounter for preprocedural cardiovascular examination: Secondary | ICD-10-CM

## 2016-11-01 DIAGNOSIS — E119 Type 2 diabetes mellitus without complications: Secondary | ICD-10-CM

## 2016-11-01 DIAGNOSIS — E782 Mixed hyperlipidemia: Secondary | ICD-10-CM

## 2016-11-01 DIAGNOSIS — I739 Peripheral vascular disease, unspecified: Secondary | ICD-10-CM

## 2016-11-01 DIAGNOSIS — I209 Angina pectoris, unspecified: Secondary | ICD-10-CM

## 2016-11-01 DIAGNOSIS — I6523 Occlusion and stenosis of bilateral carotid arteries: Secondary | ICD-10-CM | POA: Diagnosis not present

## 2016-11-01 DIAGNOSIS — Z87891 Personal history of nicotine dependence: Secondary | ICD-10-CM

## 2016-11-01 NOTE — Telephone Encounter (Signed)
Spoke with patient. He actually saw Dr. Rockey Situ today and was cleared. Form in your IN box to sign.

## 2016-11-01 NOTE — Patient Instructions (Signed)

## 2016-11-02 DIAGNOSIS — R351 Nocturia: Secondary | ICD-10-CM | POA: Diagnosis not present

## 2016-11-02 DIAGNOSIS — N3281 Overactive bladder: Secondary | ICD-10-CM | POA: Diagnosis not present

## 2016-11-03 NOTE — Telephone Encounter (Signed)
Form faxed

## 2016-11-03 NOTE — Telephone Encounter (Signed)
Signed and in Kim's box. 

## 2016-11-10 DIAGNOSIS — L821 Other seborrheic keratosis: Secondary | ICD-10-CM | POA: Diagnosis not present

## 2016-11-10 DIAGNOSIS — L719 Rosacea, unspecified: Secondary | ICD-10-CM | POA: Diagnosis not present

## 2016-11-10 DIAGNOSIS — L578 Other skin changes due to chronic exposure to nonionizing radiation: Secondary | ICD-10-CM | POA: Diagnosis not present

## 2016-11-10 DIAGNOSIS — L570 Actinic keratosis: Secondary | ICD-10-CM | POA: Diagnosis not present

## 2016-11-18 DIAGNOSIS — M5416 Radiculopathy, lumbar region: Secondary | ICD-10-CM | POA: Diagnosis not present

## 2016-11-23 DIAGNOSIS — M4316 Spondylolisthesis, lumbar region: Secondary | ICD-10-CM | POA: Diagnosis not present

## 2016-11-23 DIAGNOSIS — M5416 Radiculopathy, lumbar region: Secondary | ICD-10-CM | POA: Diagnosis not present

## 2016-11-24 ENCOUNTER — Ambulatory Visit: Payer: Self-pay | Admitting: Orthopedic Surgery

## 2016-11-29 ENCOUNTER — Other Ambulatory Visit: Payer: Self-pay | Admitting: Family Medicine

## 2016-11-29 ENCOUNTER — Ambulatory Visit (INDEPENDENT_AMBULATORY_CARE_PROVIDER_SITE_OTHER): Payer: PPO

## 2016-11-29 VITALS — BP 124/78 | HR 55 | Temp 97.9°F | Ht 72.0 in | Wt 223.5 lb

## 2016-11-29 DIAGNOSIS — Z0000 Encounter for general adult medical examination without abnormal findings: Secondary | ICD-10-CM | POA: Diagnosis not present

## 2016-11-29 DIAGNOSIS — E782 Mixed hyperlipidemia: Secondary | ICD-10-CM

## 2016-11-29 DIAGNOSIS — N401 Enlarged prostate with lower urinary tract symptoms: Secondary | ICD-10-CM | POA: Diagnosis not present

## 2016-11-29 DIAGNOSIS — N138 Other obstructive and reflux uropathy: Secondary | ICD-10-CM | POA: Diagnosis not present

## 2016-11-29 DIAGNOSIS — E538 Deficiency of other specified B group vitamins: Secondary | ICD-10-CM

## 2016-11-29 DIAGNOSIS — E119 Type 2 diabetes mellitus without complications: Secondary | ICD-10-CM | POA: Diagnosis not present

## 2016-11-29 LAB — COMPREHENSIVE METABOLIC PANEL
ALT: 16 U/L (ref 0–53)
AST: 17 U/L (ref 0–37)
Albumin: 4.1 g/dL (ref 3.5–5.2)
Alkaline Phosphatase: 38 U/L — ABNORMAL LOW (ref 39–117)
BUN: 16 mg/dL (ref 6–23)
CO2: 28 mEq/L (ref 19–32)
Calcium: 9.2 mg/dL (ref 8.4–10.5)
Chloride: 105 mEq/L (ref 96–112)
Creatinine, Ser: 1.15 mg/dL (ref 0.40–1.50)
GFR: 65.77 mL/min (ref >60.00)
Glucose, Bld: 131 mg/dL — ABNORMAL HIGH (ref 70–99)
Potassium: 4.6 mEq/L (ref 3.5–5.1)
Sodium: 140 mEq/L (ref 135–145)
Total Bilirubin: 0.6 mg/dL (ref 0.2–1.2)
Total Protein: 6.3 g/dL (ref 6.0–8.3)

## 2016-11-29 LAB — LIPID PANEL
Cholesterol: 141 mg/dL (ref 0–200)
HDL: 34.9 mg/dL — ABNORMAL LOW (ref >39.00)
LDL Cholesterol: 80 mg/dL (ref 0–99)
NonHDL: 105.65
Total CHOL/HDL Ratio: 4
Triglycerides: 128 mg/dL (ref 0.0–149.0)
VLDL: 25.6 mg/dL (ref 0.0–40.0)

## 2016-11-29 LAB — VITAMIN B12: Vitamin B-12: 1134 pg/mL — ABNORMAL HIGH (ref 211–911)

## 2016-11-29 LAB — PSA: PSA: 0.55 ng/mL (ref 0.10–4.00)

## 2016-11-29 LAB — MICROALBUMIN / CREATININE URINE RATIO
Creatinine,U: 84 mg/dL
Microalb Creat Ratio: 0.8 mg/g (ref 0.0–30.0)
Microalb, Ur: <0.7 mg/dL (ref 0.0–1.9)

## 2016-11-29 LAB — HEMOGLOBIN A1C: Hgb A1c MFr Bld: 7.2 % — ABNORMAL HIGH (ref 4.6–6.5)

## 2016-11-29 NOTE — Progress Notes (Signed)
Subjective:   [name] is a 76 y.o. male who presents for Medicare Annual/Subsequent preventive examination.  Review of Systems:  [name] Cardiac Risk Factors include: advanced age (>64men, >39 women);male gender;dyslipidemia;hypertension;obesity (BMI >30kg/m2)     Objective:    Vitals: BP 124/78 (BP Location: Right Arm, Patient Position: Sitting, Cuff Size: Normal)   Pulse (!) 55   Temp 97.9 F (36.6 C) (Oral)   Ht 6' (1.829 m)   Wt 223 lb 8 oz (101.4 kg)   SpO2 93%   BMI 30.31 kg/m   Body mass index is 30.31 kg/m.  Tobacco History  Smoking Status  . Former Smoker  . Packs/day: 1.00  . Years: 25.00  . Types: Cigarettes  . Quit date: 08/15/1988  Smokeless Tobacco  . Never Used     Counseling given: No   Past Medical History:  Diagnosis Date  . BENIGN PROSTATIC HYPERTROPHY, WITH URINARY OBSTRUCTION 09/06/2007  . 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 Diabetes mellitus without complication ([place])    diet controled per dr note  . 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 DVT, HX OF 1998  . GERD 04/30/2007  . Headache    hx migraines  . History of hiatal hernia   . History of shingles   . HYPERLIPIDEMIA 04/26/2007  . HYPERTENSION 04/30/2007  . INGUINAL HERNIA, RIGHT 05/26/2010  . LOC OSTEOARTHROS NOT SPEC PRIM/SEC LOWER LEG 11/05/2009  . Lumbar disc disease with radiculopathy   . Myocardial infarction ([place]) 1989  . OSTEOARTHRITIS 04/26/2007  . 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 Pneumonia 07/2014   [facility] hospitalization  . PSA, INCREASED 07/09/2008  . Spinal stenosis of lumbar region    Past Surgical History:  Procedure Laterality Date  . APPENDECTOMY     rupture  . CARDIAC CATHETERIZATION [name] 07/07/2015   Procedure: Left Heart Cath and Cors/Grafts Angiography;  Surgeon: [name], MD;  Location: [place] [facility];  Service: Cardiovascular;   Laterality: [name];  . CATARACT EXTRACTION, BILATERAL    . COLONOSCOPY  03/2010   HP polyp, diverticulosis, rpt 10 yrs ([name])  . CORONARY ARTERY BYPASS GRAFT  1990   3 vessel   . CYSTOSCOPY  12/23/10   Cope  . KNEE ARTHROSCOPY Right    x2  . LAMINOTOMY  1986   L5/S1 lumbar laminotomy for two ruptured discs/fusion  . LUMBAR LAMINECTOMY/DECOMPRESSION MICRODISCECTOMY [name] 07/13/2016   Procedure: LUMBAR TWO-THREE, LUMBAR THREE-FOUR, LUMBAR FOUR-FIVE LAMINECTOMY AND FORAMINOTOMY;  Surgeon: [name], MD;  Location: [place];  Service: Neurosurgery;  Laterality: [name];  LAMINECTOMY AND FORAMINOTOMY L2-L3, L3-L4,L4-L5  . TOTAL HIP ARTHROPLASTY Bilateral 1999,2000   Family History  Problem Relation Age of Onset  . Stroke Mother   . Heart attack Mother   . Diabetes Mother   . Stroke Father   . Heart disease Father   . Kidney disease Father     PCKD  . Cancer Sister     throat  . Diabetes Brother   . Kidney disease Sister     PCKD  . Cancer Other     5/7 nephews with lung cancer   History  Sexual Activity  . Sexual activity: Yes    Outpatient Encounter Prescriptions as of 11/29/2016  Medication Sig  . aspirin 81 MG tablet Take 81 mg by mouth daily.   . cyclobenzaprine (FLEXERIL) 10 MG tablet Take 1 tablet (10 mg total)  by mouth 3 (three) times daily as needed for muscle spasms.  Marland Kitchen docusate sodium (COLACE) 100 MG capsule Take 1 capsule (100 mg total) by mouth 2 (two) times daily.  Marland Kitchen ezetimibe (ZETIA) 10 MG tablet Take 10 mg by mouth daily.  . finasteride (PROSCAR) 5 MG tablet Take 5 mg by mouth daily.    . isosorbide mononitrate (IMDUR) 30 MG 24 hr tablet Take 1 tablet (30 mg total) by mouth daily.  . nitroGLYCERIN (NITROSTAT) 0.4 MG SL tablet Place 0.4 mg under the tongue every 5 (five) minutes as needed for chest pain.  Marland Kitchen oxyCODONE-acetaminophen (PERCOCET/ROXICET) 5-325 MG tablet Take 1-2 tablets by mouth every 4 (four) hours as needed for moderate pain.  . pantoprazole (PROTONIX)  40 MG tablet Take 40 mg by mouth daily.  . simvastatin (ZOCOR) 40 MG tablet Take 40 mg by mouth daily.  Marland Kitchen tolterodine (DETROL LA) 4 MG 24 hr capsule Take 4 mg by mouth daily.  . vitamin B-12 (CYANOCOBALAMIN) 1000 MCG tablet Take 1 tablet (1,000 mcg total) by mouth daily.   No facility-administered encounter medications on file as of 11/29/2016.     Activities of Daily Living In your present state of health, do you have any difficulty performing the following activities: 11/29/2016 07/04/2016  Hearing? N Y  Vision? N N  Difficulty concentrating or making decisions? N N  Walking or climbing stairs? Y Y  Dressing or bathing? N N  Doing errands, shopping? N N  Preparing Food and eating ? N -  Using the Toilet? N -  In the past six months, have you accidently leaked urine? N -  Do you have problems with loss of bowel control? N -  Managing your Medications? N -  Managing your Finances? N -  Housekeeping or managing your Housekeeping? N -  Some recent data might be hidden    Patient Care Team: Ria Bush, MD as PCP - General (Family Medicine)   Assessment:     Hearing Screening   125Hz  250Hz  500Hz  1000Hz  2000Hz  3000Hz  4000Hz  6000Hz  8000Hz   Right ear:   40 40 40  0    Left ear:   40 40 40  0    Vision Screening Comments: Last vision exam in 2017 with Dr. Birder Robson   Exercise Activities and Dietary recommendations Current Exercise Habits: The patient does not participate in regular exercise at present, Exercise limited by: orthopedic condition(s)  Goals    . Increase water intake          Starting 11/29/16, I will continue to drink at least 64 oz of water daily.       Fall Risk Fall Risk  11/29/2016 07/03/2015 08/28/2014 07/05/2013 03/11/2013  Falls in the past year? No Yes No No Yes  Number falls in past yr: - 1 - - 1  Injury with Fall? - Yes - - -   Depression Screen PHQ 2/9 Scores 11/29/2016 07/03/2015 08/28/2014 07/05/2013  PHQ - 2 Score 0 0 0 1  PHQ- 9 Score  - - - -    Cognitive Function MMSE - Mini Mental State Exam 11/29/2016  Orientation to time 5  Orientation to Place 5  Registration 3  Attention/ Calculation 0  Recall 3  Language- name 2 objects 0  Language- repeat 1  Language- follow 3 step command 3  Language- read & follow direction 0  Write a sentence 0  Copy design 0  Total score 20       PLEASE  NOTE: A Mini-Cog screen was completed. Maximum score is 20. A value of 0 denotes this part of [place] MMSE was not completed or the patient failed this part of the Mini-Cog screening.   Mini-Cog Screening Orientation to Time - Max 5 pts Orientation to Place - Max 5 pts Registration - Max 3 pts Recall - Max 3 pts Language Repeat - Max 1 pts Language Follow 3 Step Command - Max 3 pts   Immunization History  Administered Date(s) Administered  . Influenza Whole 08/15/2000, 07/04/2007, 05/09/2008, 05/31/2010  . Influenza, High Dose Seasonal PF 05/15/2013, 06/25/2015  . Influenza,inj,Quad PF,36+ Mos 04/17/2014, 05/18/2016  . Pneumococcal Conjugate-13 02/09/2015  . Pneumococcal Polysaccharide-23 08/15/2000, 10/29/2008  . Td 08/15/2005  . Zoster 02/04/2009   Screening Tests Health Maintenance  Topic Date Due  . OPHTHALMOLOGY EXAM  01/12/2017 (Originally 08/15/2016)  . DTaP/Tdap/Td (1 - Tdap) 08/14/2025 (Originally 08/16/2005)  . TETANUS/TDAP  08/14/2025 (Originally 08/16/2015)  . FOOT EXAM  12/31/2016  . INFLUENZA VACCINE  03/15/2017  . HEMOGLOBIN A1C  05/31/2017  . URINE MICROALBUMIN  11/29/2017  . COLONOSCOPY  03/23/2020  . PNA vac Low Risk Adult  Completed      Plan:     I have personally reviewed and addressed the Medicare Annual Wellness questionnaire and have noted the following in the patient's chart:  A. Medical and social history B. Use of alcohol, tobacco or illicit drugs  C. Current medications and supplements D. Functional ability and status E.  Nutritional status F.  Physical activity G. Advance  directives H. List of other physicians I.  Hospitalizations, surgeries, and ER visits in previous 12 months J.  [place] to include hearing, vision, cognitive, depression L. Referrals and appointments - none  In addition, I have reviewed and discussed with patient certain preventive protocols, quality metrics, and best practice recommendations. A written personalized care plan for preventive services as well as general preventive health recommendations were provided to patient.  See attached scanned questionnaire for additional information.   Signed,   Lindell Noe, MHA, BS, LPN Health Coach

## 2016-11-29 NOTE — Progress Notes (Signed)
Pre visit review using our clinic review tool, if applicable. No additional management support is needed unless otherwise documented below in the visit note. 

## 2016-11-29 NOTE — Progress Notes (Signed)
PCP notes:   Health maintenance:  A1C - completed  Tetanus - postponed/insurance  Urine microalbumin - completed  Foot exam - PCP will address at next appt  Eye exam - pt has future appt scheduled in May 2018  Abnormal screenings:   Hearing - failed  Patient concerns:   Pt has concerns with pain in neck, back, and knees. Pain scale: 10/10.  Nurse concerns:  None  Next PCP appt:   12/06/16 @ 1030

## 2016-11-29 NOTE — Patient Instructions (Addendum)
Mr. Bard , Thank you for taking time to come for your Medicare Wellness Visit. I appreciate your ongoing commitment to your health goals. Please review the following plan we discussed and let me know if I can assist you in the future.   These are the goals we discussed: Goals    . Increase water intake          Starting 11/29/16, I will continue to drink at least 64 oz of water daily.        This is a list of the screening recommended for you and due dates:  Health Maintenance  Topic Date Due  . Eye exam for diabetics  01/12/2017*  . DTaP/Tdap/Td vaccine (1 - Tdap) 08/14/2025*  . Tetanus Vaccine  08/14/2025*  . Complete foot exam   12/31/2016  . Flu Shot  03/15/2017  . Hemoglobin A1C  05/31/2017  . Urine Protein Check  11/29/2017  . Colon Cancer Screening  03/23/2020  . Pneumonia vaccines  Completed  *Topic was postponed. The date shown is not the original due date.   Preventive Care for Adults  A healthy lifestyle and preventive care can promote health and wellness. Preventive health guidelines for adults include the following key practices.  . A routine yearly physical is a good way to check with your health care provider about your health and preventive screening. It is a chance to share any concerns and updates on your health and to receive a thorough exam.  . Visit your dentist for a routine exam and preventive care every 6 months. Brush your teeth twice a day and floss once a day. Good oral hygiene prevents tooth decay and gum disease.  . The frequency of eye exams is based on your age, health, family medical history, use  of contact lenses, and other factors. Follow your health care provider's ecommendations for frequency of eye exams.  . Eat a healthy diet. Foods like vegetables, fruits, whole grains, low-fat dairy products, and lean protein foods contain the nutrients you need without too many calories. Decrease your intake of foods high in solid fats, added sugars,  and salt. Eat the right amount of calories for you. Get information about a proper diet from your health care provider, if necessary.  . Regular physical exercise is one of the most important things you can do for your health. Most adults should get at least 150 minutes of moderate-intensity exercise (any activity that increases your heart rate and causes you to sweat) each week. In addition, most adults need muscle-strengthening exercises on 2 or more days a week.  Silver Sneakers may be a benefit available to you. To determine eligibility, you may visit the website: www.silversneakers.com or contact program at 856-680-4396 Mon-Fri between 8AM-8PM.   . Maintain a healthy weight. The body mass index (BMI) is a screening tool to identify possible weight problems. It provides an estimate of body fat based on height and weight. Your health care provider can find your BMI and can help you achieve or maintain a healthy weight.   For adults 20 years and older: ? A BMI below 18.5 is considered underweight. ? A BMI of 18.5 to 24.9 is normal. ? A BMI of 25 to 29.9 is considered overweight. ? A BMI of 30 and above is considered obese.   . Maintain normal blood lipids and cholesterol levels by exercising and minimizing your intake of saturated fat. Eat a balanced diet with plenty of fruit and vegetables. Blood tests for lipids  and cholesterol should begin at age 32 and be repeated every 5 years. If your lipid or cholesterol levels are high, you are over 50, or you are at high risk for heart disease, you may need your cholesterol levels checked more frequently. Ongoing high lipid and cholesterol levels should be treated with medicines if diet and exercise are not working.  . If you smoke, find out from your health care provider how to quit. If you do not use tobacco, please do not start.  . If you choose to drink alcohol, please do not consume more than 2 drinks per day. One drink is considered to be 12  ounces (355 mL) of beer, 5 ounces (148 mL) of wine, or 1.5 ounces (44 mL) of liquor.  . If you are 57-70 years old, ask your health care provider if you should take aspirin to prevent strokes.  . Use sunscreen. Apply sunscreen liberally and repeatedly throughout the day. You should seek shade when your shadow is shorter than you. Protect yourself by wearing long sleeves, pants, a wide-brimmed hat, and sunglasses year round, whenever you are outdoors.  . Once a month, do a whole body skin exam, using a mirror to look at the skin on your back. Tell your health care provider of new moles, moles that have irregular borders, moles that are larger than a pencil eraser, or moles that have changed in shape or color.

## 2016-11-30 NOTE — Progress Notes (Signed)
I reviewed health advisor's note, was available for consultation, and agree with documentation and plan.  

## 2016-12-01 DIAGNOSIS — M542 Cervicalgia: Secondary | ICD-10-CM | POA: Diagnosis not present

## 2016-12-01 DIAGNOSIS — M5412 Radiculopathy, cervical region: Secondary | ICD-10-CM | POA: Diagnosis not present

## 2016-12-06 ENCOUNTER — Ambulatory Visit (INDEPENDENT_AMBULATORY_CARE_PROVIDER_SITE_OTHER): Payer: PPO | Admitting: Family Medicine

## 2016-12-06 ENCOUNTER — Encounter: Payer: Self-pay | Admitting: Family Medicine

## 2016-12-06 VITALS — BP 136/70 | HR 100 | Temp 97.8°F | Wt 221.0 lb

## 2016-12-06 DIAGNOSIS — N401 Enlarged prostate with lower urinary tract symptoms: Secondary | ICD-10-CM

## 2016-12-06 DIAGNOSIS — I10 Essential (primary) hypertension: Secondary | ICD-10-CM

## 2016-12-06 DIAGNOSIS — E782 Mixed hyperlipidemia: Secondary | ICD-10-CM | POA: Diagnosis not present

## 2016-12-06 DIAGNOSIS — Z7189 Other specified counseling: Secondary | ICD-10-CM | POA: Diagnosis not present

## 2016-12-06 DIAGNOSIS — I25708 Atherosclerosis of coronary artery bypass graft(s), unspecified, with other forms of angina pectoris: Secondary | ICD-10-CM | POA: Diagnosis not present

## 2016-12-06 DIAGNOSIS — E119 Type 2 diabetes mellitus without complications: Secondary | ICD-10-CM

## 2016-12-06 DIAGNOSIS — N138 Other obstructive and reflux uropathy: Secondary | ICD-10-CM | POA: Diagnosis not present

## 2016-12-06 DIAGNOSIS — Z0001 Encounter for general adult medical examination with abnormal findings: Secondary | ICD-10-CM | POA: Insufficient documentation

## 2016-12-06 DIAGNOSIS — E538 Deficiency of other specified B group vitamins: Secondary | ICD-10-CM

## 2016-12-06 DIAGNOSIS — M8949 Other hypertrophic osteoarthropathy, multiple sites: Secondary | ICD-10-CM

## 2016-12-06 DIAGNOSIS — Z0000 Encounter for general adult medical examination without abnormal findings: Secondary | ICD-10-CM | POA: Diagnosis not present

## 2016-12-06 DIAGNOSIS — M150 Primary generalized (osteo)arthritis: Secondary | ICD-10-CM

## 2016-12-06 DIAGNOSIS — M159 Polyosteoarthritis, unspecified: Secondary | ICD-10-CM

## 2016-12-06 MED ORDER — VITAMIN B-12 500 MCG PO TABS: 500.0000 ug | ORAL_TABLET | Freq: Every day | ORAL | Status: DC

## 2016-12-06 NOTE — Assessment & Plan Note (Signed)
Chronic osteoarthritis s/p multiple surgeries latest lumbar laminectomy 06/2016 now planned R knee replacement, currently awaiting results of cervical MRI for neck pain.

## 2016-12-06 NOTE — Assessment & Plan Note (Signed)
Preventative protocols reviewed and updated unless pt declined. Discussed healthy diet and lifestyle.  

## 2016-12-06 NOTE — Assessment & Plan Note (Signed)
Chronic, stable on zetia and statin. HDL remains low - activity limited by osteoarthritis pain.

## 2016-12-06 NOTE — Assessment & Plan Note (Addendum)
Chronic, stable. Worsened sugar levels based on last check. Patient will work towards healthy diet changes for better glycemic control.

## 2016-12-06 NOTE — Progress Notes (Signed)
BP 136/70   Pulse 100   Temp 97.8 F (36.6 C) (Oral)   Wt 221 lb (100.2 kg)   BMI 29.97 kg/m    CC: CPE Subjective:    Patient ID: [name], male    DOB: 05/05/1941, 76 y.o.   MRN: [id number]  HPI: [name] is a 76 y.o. male presenting on 12/06/2016 for Annual Exam   Saw Katha Cabal last week for medicare wellness visit. Note reviewed. Ongoing osteoarthritis pain.   Saw Dr Rockey Situ last month - known CAD on imdur.   h/o lumbar laminectomy 06/2016 [name]). Overall improvement. Now with cervical neck pain and R shoulder pain. Pending cervical MRI results. Pending R knee replacement (12/26/2016) with Dr Maureen Ralphs.   Preventative: Colon cancer screening - colonoscopy 03/2010 HP polyp, diverticulosis, rpt 10 yrs [facility]) Prostate cancer screening - sees Dr Karsten Ro   Lung cancer screening - not eligible  Flu shot - yearly  Td 2007  Pneumovax 10/2008, prevnar 01/2015 zostavax 2010. Interested in shingrix.  Advanced directive discussion - needs updating. Wife is HCPOA. Packet provided last year.  Seat belt use discussed  Sunscreen use and skin screen discussed. No changing moles on skin.  Ex smoker - quit 1990 Alcohol - none  Married, wife [name], for 25 years in 2016. 1 daughter and 3 grandkids from first marriage. 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  Diet: good water, fruits/vegetables daily  Relevant past medical, surgical, family and social history reviewed and updated as indicated. Interim medical history since our last visit reviewed. Allergies and medications reviewed and updated. Outpatient Medications Prior to Visit  Medication Sig Dispense Refill  . aspirin 81 MG tablet Take 81 mg by mouth daily.     . cyclobenzaprine (FLEXERIL) 10 MG tablet Take 1 tablet (10 mg total) by mouth 3 (three) times daily as needed for muscle  spasms. 50 tablet 1  . docusate sodium (COLACE) 100 MG capsule Take 1 capsule (100 mg total) by mouth 2 (two) times daily. 60 capsule 0  . ezetimibe (ZETIA) 10 MG tablet Take 10 mg by mouth daily.    . finasteride (PROSCAR) 5 MG tablet Take 5 mg by mouth daily.      . isosorbide mononitrate (IMDUR) 30 MG 24 hr tablet Take 1 tablet (30 mg total) by mouth daily. 90 tablet 3  . nitroGLYCERIN (NITROSTAT) 0.4 MG SL tablet Place 0.4 mg under the tongue every 5 (five) minutes as needed for chest pain.    Marland Kitchen oxyCODONE-acetaminophen (PERCOCET/ROXICET) 5-325 MG tablet Take 1-2 tablets by mouth every 4 (four) hours as needed for moderate pain. 50 tablet 0  . pantoprazole (PROTONIX) 40 MG tablet Take 40 mg by mouth daily.    . simvastatin (ZOCOR) 40 MG tablet Take 40 mg by mouth daily.    Marland Kitchen tolterodine (DETROL LA) 4 MG 24 hr capsule Take 4 mg by mouth daily.    . vitamin B-12 (CYANOCOBALAMIN) 1000 MCG tablet Take 1 tablet (1,000 mcg total) by mouth daily.     No facility-administered medications prior to visit.      Per HPI unless specifically indicated in ROS section below Review of Systems  Constitutional: Negative for activity change, appetite change, chills, fatigue, fever and unexpected weight change.  HENT: Negative for hearing loss.   Eyes: Negative for visual disturbance.  Respiratory: Negative for cough, chest  tightness, shortness of breath and wheezing.   Cardiovascular: Negative for chest pain, palpitations and leg swelling.  Gastrointestinal: Negative for abdominal distention, abdominal pain, blood in stool, constipation, diarrhea, nausea and vomiting.  Genitourinary: Negative for difficulty urinating and hematuria.  Musculoskeletal: Negative for arthralgias, myalgias and neck pain.  Skin: Negative for rash.  Neurological: Negative for dizziness, seizures, syncope and headaches.  Hematological: Negative for adenopathy. Bruises/bleeds easily.  Psychiatric/Behavioral: Negative for dysphoric  mood. The patient is not nervous/anxious.        Objective:    BP 136/70   Pulse 100   Temp 97.8 F (36.6 C) (Oral)   Wt 221 lb (100.2 kg)   BMI 29.97 kg/m   Wt Readings from Last 3 Encounters:  12/06/16 221 lb (100.2 kg)  11/29/16 223 lb 8 oz (101.4 kg)  11/01/16 217 lb 8 oz (98.7 kg)    Physical Exam  Constitutional: He is oriented to person, place, and time. He appears well-developed and well-nourished. No distress.  HENT:  Head: Normocephalic and atraumatic.  Right Ear: Hearing, tympanic membrane, external ear and ear canal normal.  Left Ear: Hearing, tympanic membrane, external ear and ear canal normal.  Nose: Nose normal.  Mouth/Throat: Uvula is midline, oropharynx is clear and moist and mucous membranes are normal. No oropharyngeal exudate, posterior oropharyngeal edema or posterior oropharyngeal erythema.  Eyes: Conjunctivae and EOM are normal. Pupils are equal, round, and reactive to light. No scleral icterus.  Neck: Normal range of motion. Neck supple. Carotid bruit is not present. No thyromegaly present.  Cardiovascular: Normal rate, regular rhythm, normal heart sounds and intact distal pulses.   No murmur heard. Pulses:      Radial pulses are 2+ on the right side, and 2+ on the left side.  Pulmonary/Chest: Effort normal and breath sounds normal. No respiratory distress. He has no wheezes. He has no rales.  Abdominal: Soft. Bowel sounds are normal. He exhibits no distension and no mass. There is no tenderness. There is no rebound and no guarding.  Musculoskeletal: Normal range of motion. He exhibits no edema.  Lymphadenopathy:    He has no cervical adenopathy.  Neurological: He is alert and oriented to person, place, and time.  CN grossly intact, station and gait intact  Skin: Skin is warm and dry. No rash noted.  Psychiatric: He has a normal mood and affect. His behavior is normal. Judgment and thought content normal.  Nursing note and vitals reviewed.  Results  for orders placed or performed in visit on 11/29/16  Lipid panel  Result Value Ref Range   Cholesterol 141 0 - 200 mg/dL   Triglycerides 128.0 0.0 - 149.0 mg/dL   HDL 34.90 (L) >39.00 mg/dL   VLDL 25.6 0.0 - 40.0 mg/dL   LDL Cholesterol 80 0 - 99 mg/dL   Total CHOL/HDL Ratio 4    NonHDL 105.65   Comprehensive metabolic panel  Result Value Ref Range   Sodium 140 135 - 145 mEq/L   Potassium 4.6 3.5 - 5.1 mEq/L   Chloride 105 96 - 112 mEq/L   CO2 28 19 - 32 mEq/L   Glucose, Bld 131 (H) 70 - 99 mg/dL   BUN 16 6 - 23 mg/dL   Creatinine, Ser 1.15 0.40 - 1.50 mg/dL   Total Bilirubin 0.6 0.2 - 1.2 mg/dL   Alkaline Phosphatase 38 (L) 39 - 117 U/L   AST 17 0 - 37 U/L   ALT 16 0 - 53 U/L   Total  Protein 6.3 6.0 - 8.3 g/dL   Albumin 4.1 3.5 - 5.2 g/dL   Calcium 9.2 8.4 - 10.5 mg/dL   GFR 65.77 >60.00 mL/min  Vitamin B12  Result Value Ref Range   Vitamin B-12 1,134 (H) 211 - 911 pg/mL  PSA  Result Value Ref Range   PSA 0.55 0.10 - 4.00 ng/mL  Hemoglobin A1c  Result Value Ref Range   Hgb A1c MFr Bld 7.2 (H) 4.6 - 6.5 %  Microalbumin / creatinine urine ratio  Result Value Ref Range   Microalb, Ur <0.7 0.0 - 1.9 mg/dL   Creatinine,U 84.0 mg/dL   Microalb Creat Ratio 0.8 0.0 - 30.0 mg/g      Assessment & Plan:   Problem List Items Addressed This Visit    Advanced care planning/counseling discussion    Advanced directive discussion - needs updating. Wife is HCPOA. Packet provided last year.       Benign prostatic hyperplasia with urinary obstruction    Sees urology regularly.      CAD, ARTERY BYPASS GRAFT    Continue aspirin, statin. Followed regularly by cards.       Diet-controlled diabetes mellitus (HCC)    Chronic, stable. Worsened sugar levels based on last check. Patient will work towards healthy diet changes for better glycemic control.       Essential hypertension    Chronic, stable on imdur - used more for CAD than HTN.       Health maintenance examination -  Primary    Preventative protocols reviewed and updated unless pt declined. Discussed healthy diet and lifestyle.       Hyperlipidemia    Chronic, stable on zetia and statin. HDL remains low - activity limited by osteoarthritis pain.       Low vitamin B12 level    b12 now high - rec decrease b12 to 560mcg daily.       Osteoarthritis    Chronic osteoarthritis s/p multiple surgeries latest lumbar laminectomy 06/2016 now planned R knee replacement, currently awaiting results of cervical MRI for neck pain.           Follow up plan: Return in about 6 months (around 06/07/2017) for follow up visit.  Ria Bush, MD

## 2016-12-06 NOTE — Patient Instructions (Addendum)
Ask insurance about coverage for shingrix (new shingles vaccine). You will likely need to get at pharmacy.  Work on sugar levels.  Good to see you today, call us with questions. Return as needed or in 6 months for diabetes follow up visit.  Health Maintenance, Male A healthy lifestyle and preventive care is important for your health and wellness. Ask your health care provider about what schedule of regular examinations is right for you. What should I know about weight and diet?  Eat a Healthy Diet  Eat plenty of vegetables, fruits, whole grains, low-fat dairy products, and lean protein.  Do not eat a lot of foods high in solid fats, added sugars, or salt. Maintain a Healthy Weight  Regular exercise can help you achieve or maintain a healthy weight. You should:  Do at least 150 minutes of exercise each week. The exercise should increase your heart rate and make you sweat (moderate-intensity exercise).  Do strength-training exercises at least twice a week. Watch Your Levels of Cholesterol and Blood Lipids  Have your blood tested for lipids and cholesterol every 5 years starting at 76 years of age. If you are at high risk for heart disease, you should start having your blood tested when you are 76 years old. You may need to have your cholesterol levels checked more often if:  Your lipid or cholesterol levels are high.  You are older than 76 years of age.  You are at high risk for heart disease. What should I know about cancer screening? Many types of cancers can be detected early and may often be prevented. Lung Cancer  You should be screened every year for lung cancer if:  You are a current smoker who has smoked for at least 30 years.  You are a former smoker who has quit within the past 15 years.  Talk to your health care provider about your screening options, when you should start screening, and how often you should be screened. Colorectal Cancer  Routine colorectal cancer  screening usually begins at 76 years of age and should be repeated every 5-10 years until you are 76 years old. You may need to be screened more often if early forms of precancerous polyps or small growths are found. Your health care provider may recommend screening at an earlier age if you have risk factors for colon cancer.  Your health care provider may recommend using home test kits to check for hidden blood in the stool.  A small camera at the end of a tube can be used to examine your colon (sigmoidoscopy or colonoscopy). This checks for the earliest forms of colorectal cancer. Prostate and Testicular Cancer  Depending on your age and overall health, your health care provider may do certain tests to screen for prostate and testicular cancer.  Talk to your health care provider about any symptoms or concerns you have about testicular or prostate cancer. Skin Cancer  Check your skin from head to toe regularly.  Tell your health care provider about any new moles or changes in moles, especially if:  There is a change in a mole's size, shape, or color.  You have a mole that is larger than a pencil eraser.  Always use sunscreen. Apply sunscreen liberally and repeat throughout the day.  Protect yourself by wearing long sleeves, pants, a wide-brimmed hat, and sunglasses when outside. What should I know about heart disease, diabetes, and high blood pressure?  If you are 26-27 years of age, have your blood  pressure checked every 3-5 years. If you are 71 years of age or older, have your blood pressure checked every year. You should have your blood pressure measured twice-once when you are at a hospital or clinic, and once when you are not at a hospital or clinic. Record the average of the two measurements. To check your blood pressure when you are not at a hospital or clinic, you can use:  An automated blood pressure machine at a pharmacy.  A home blood pressure monitor.  Talk to your health  care provider about your target blood pressure.  If you are between 74-29 years old, ask your health care provider if you should take aspirin to prevent heart disease.  Have regular diabetes screenings by checking your fasting blood sugar level.  If you are at a normal weight and have a low risk for diabetes, have this test once every three years after the age of 22.  If you are overweight and have a high risk for diabetes, consider being tested at a younger age or more often.  A one-time screening for abdominal aortic aneurysm (AAA) by ultrasound is recommended for men aged 55-75 years who are current or former smokers. What should I know about preventing infection? Hepatitis B  If you have a higher risk for hepatitis B, you should be screened for this virus. Talk with your health care provider to find out if you are at risk for hepatitis B infection. Hepatitis C  Blood testing is recommended for:  Everyone born from [birth year] through 1965.  Anyone with known risk factors for hepatitis C. Sexually Transmitted Diseases (STDs)  You should be screened each year for STDs including gonorrhea and chlamydia if:  You are sexually active and are younger than 76 years of age.  You are older than 76 years of age and your health care provider tells you that you are at risk for this type of infection.  Your sexual activity has changed since you were last screened and you are at an increased risk for chlamydia or gonorrhea. Ask your health care provider if you are at risk.  Talk with your health care provider about whether you are at high risk of being infected with HIV. Your health care provider may recommend a prescription medicine to help prevent HIV infection. What else can I do?  Schedule regular health, dental, and eye exams.  Stay current with your vaccines (immunizations).  Do not use any tobacco products, such as cigarettes, chewing tobacco, and e-cigarettes. If you need help quitting,  ask your health care provider.  Limit alcohol intake to no more than 2 drinks per day. One drink equals 12 ounces of beer, 5 ounces of wine, or 1 ounces of hard liquor.  Do not use street drugs.  Do not share needles.  Ask your health care provider for help if you need support or information about quitting drugs.  Tell your health care provider if you often feel depressed.  Tell your health care provider if you have ever been abused or do not feel safe at home. This information is not intended to replace advice given to you by your health care provider. Make sure you discuss any questions you have with your health care provider. Document Released: 01/28/2008 Document Revised: 03/30/2016 Document Reviewed: 05/05/2015 Elsevier Interactive Patient Education  2017 [employer].

## 2016-12-06 NOTE — Assessment & Plan Note (Signed)
Continue aspirin, statin. Followed regularly by cards.

## 2016-12-06 NOTE — Assessment & Plan Note (Signed)
Sees urology regularly  

## 2016-12-06 NOTE — Progress Notes (Signed)
Pre visit review using our clinic review tool, if applicable. No additional management support is needed unless otherwise documented below in the visit note. 

## 2016-12-06 NOTE — Assessment & Plan Note (Signed)
Chronic, stable on imdur - used more for CAD than HTN.

## 2016-12-06 NOTE — Assessment & Plan Note (Signed)
Advanced directive discussion - needs updating. Wife is HCPOA. Packet provided last year.

## 2016-12-06 NOTE — Assessment & Plan Note (Signed)
b12 now high - rec decrease b12 to 570mcg daily.

## 2016-12-07 ENCOUNTER — Other Ambulatory Visit: Payer: Self-pay | Admitting: Orthopedic Surgery

## 2016-12-07 DIAGNOSIS — M4712 Other spondylosis with myelopathy, cervical region: Secondary | ICD-10-CM | POA: Diagnosis not present

## 2016-12-07 DIAGNOSIS — M542 Cervicalgia: Secondary | ICD-10-CM | POA: Diagnosis not present

## 2016-12-07 DIAGNOSIS — M4802 Spinal stenosis, cervical region: Secondary | ICD-10-CM | POA: Diagnosis not present

## 2016-12-12 ENCOUNTER — Encounter (HOSPITAL_COMMUNITY): Payer: Self-pay

## 2016-12-12 NOTE — Patient Instructions (Signed)
[place]  12/12/2016   Your procedure is scheduled on: 12/26/16  Report to [facility] Main  Entrance            To admitting  at    1000 AM.    Call this number if you have problems the morning of surgery  [phone]   Remember: ONLY 1 PERSON MAY GO WITH YOU TO SHORT STAY TO GET  READY MORNING OF [place].  Do not eat food or drink liquids :After Midnight.     Take these medicines the morning of surgery with A SIP OF WATER:                                 You may not have any metal on your body including hair pins and                 piercings  Do not wear jewelry, lotions, powders or perfumes, deodorant                      Men may shave face and neck.   Do not bring valuables to the hospital. [place].  Contacts, dentures or bridgework may not be worn into surgery.  Leave suitcase in the car. After surgery it may be brought to your room.                  Please read over the following fact sheets you were given: _____________________________________________________________________            [facility] - Preparing for Surgery Before surgery, you can play an important role.  Because skin is not sterile, your skin needs to be as free of germs as possible.  You can reduce the number of germs on your skin by washing with CHG (chlorahexidine gluconate) soap before surgery.  CHG is an antiseptic cleaner which kills germs and bonds with the skin to continue killing germs even after washing. Please DO NOT use if you have an allergy to CHG or antibacterial soaps.  If your skin becomes reddened/irritated stop using the CHG and inform your nurse when you arrive at Short Stay. Do not shave (including legs and underarms) for at least 48 hours prior to the first CHG shower.  You may shave your face/neck. Please follow these instructions carefully:  1.  Shower with CHG Soap the night before surgery  and the  morning of Surgery.  2.  If you choose to wash your hair, wash your hair first as usual with your  normal  shampoo.  3.  After you shampoo, rinse your hair and body thoroughly to remove the  shampoo.                           4.  Use CHG as you would any other liquid soap.  You can apply chg directly  to the skin and wash                       Gently with a scrungie or clean washcloth.  5.  Apply the CHG Soap to your body ONLY FROM THE NECK DOWN.  Do not use on face/ open                           Wound or open sores. Avoid contact with eyes, ears mouth and genitals (private parts).                       Wash face,  Genitals (private parts) with your normal soap.             6.  Wash thoroughly, paying special attention to the area where your surgery  will be performed.  7.  Thoroughly rinse your body with warm water from the neck down.  8.  DO NOT shower/wash with your normal soap after using and rinsing off  the CHG Soap.                9.  Pat yourself dry with a clean towel.            10.  Wear clean pajamas.            11.  Place clean sheets on your bed the night of your first shower and do not  sleep with pets. Day of Surgery : Do not apply any lotions/deodorants the morning of surgery.  Please wear clean clothes to the hospital/surgery center.  FAILURE TO FOLLOW THESE INSTRUCTIONS MAY RESULT IN THE CANCELLATION OF YOUR SURGERY PATIENT SIGNATURE_________________________________  NURSE SIGNATURE__________________________________  ________________________________________________________________________  WHAT IS A BLOOD TRANSFUSION? Blood Transfusion Information  A transfusion is the replacement of blood or some of its parts. Blood is made up of multiple cells which provide different functions.  Red blood cells carry oxygen and are used for blood loss replacement.  White blood cells fight against infection.  Platelets control bleeding.  Plasma helps clot  blood.  Other blood products are available for specialized needs, such as hemophilia or other clotting disorders. BEFORE THE TRANSFUSION  Who gives blood for transfusions?   Healthy volunteers who are fully evaluated to make sure their blood is safe. This is blood bank blood. Transfusion therapy is the safest it has ever been in the practice of medicine. Before blood is taken from a donor, a complete history is taken to make sure that person has no history of diseases nor engages in risky social behavior (examples are intravenous drug use or sexual activity with multiple partners). The donor's travel history is screened to minimize risk of transmitting infections, such as malaria. The donated blood is tested for signs of infectious diseases, such as HIV and hepatitis. The blood is then tested to be sure it is compatible with you in order to minimize the chance of a transfusion reaction. If you or a relative donates blood, this is often done in anticipation of surgery and is not appropriate for emergency situations. It takes many days to process the donated blood. RISKS AND COMPLICATIONS Although transfusion therapy is very safe and saves many lives, the main dangers of transfusion include:   Getting an infectious disease.  Developing a transfusion reaction. This is an allergic reaction to something in the blood you were given. Every precaution is taken to prevent this. The decision to have a blood transfusion has been considered carefully by your caregiver before blood is given. Blood is not given unless the benefits outweigh the risks. AFTER THE TRANSFUSION  Right after receiving a blood transfusion, you will usually feel much better and more energetic. This is especially  true if your red blood cells have gotten low (anemic). The transfusion raises the level of the red blood cells which carry oxygen, and this usually causes an energy increase.  The nurse administering the transfusion will monitor  you carefully for complications. HOME CARE INSTRUCTIONS  No special instructions are needed after a transfusion. You may find your energy is better. Speak with your caregiver about any limitations on activity for underlying diseases you may have. SEEK MEDICAL CARE IF:   Your condition is not improving after your transfusion.  You develop redness or irritation at the intravenous (IV) site. SEEK IMMEDIATE MEDICAL CARE IF:  Any of the following symptoms occur over the next 12 hours:  Shaking chills.  You have a temperature by mouth above 102 F (38.9 C), not controlled by medicine.  Chest, back, or muscle pain.  People around you feel you are not acting correctly or are confused.  Shortness of breath or difficulty breathing.  Dizziness and fainting.  You get a rash or develop hives.  You have a decrease in urine output.  Your urine turns a dark color or changes to pink, red, or brown. Any of the following symptoms occur over the next 10 days:  You have a temperature by mouth above 102 F (38.9 C), not controlled by medicine.  Shortness of breath.  Weakness after normal activity.  The white part of the eye turns yellow (jaundice).  You have a decrease in the amount of urine or are urinating less often.  Your urine turns a dark color or changes to pink, red, or brown. Document Released: 07/29/2000 Document Revised: 10/24/2011 Document Reviewed: 03/17/2008 ExitCare Patient Information 2014 [place].  _______________________________________________________________________  Incentive Spirometer  An incentive spirometer is a tool that can help keep your lungs clear and active. This tool measures how well you are filling your lungs with each breath. Taking long deep breaths may help reverse or decrease the chance of developing breathing (pulmonary) problems (especially infection) following:  A long period of time when you are unable to move or be active. BEFORE THE  PROCEDURE   If the spirometer includes an indicator to show your best effort, your nurse or respiratory therapist will set it to a desired goal.  If possible, sit up straight or lean slightly forward. Try not to slouch.  Hold the incentive spirometer in an upright position. INSTRUCTIONS FOR USE  1. Sit on the edge of your bed if possible, or sit up as far as you can in bed or on a chair. 2. Hold the incentive spirometer in an upright position. 3. Breathe out normally. 4. Place the mouthpiece in your mouth and seal your lips tightly around it. 5. Breathe in slowly and as deeply as possible, raising the piston or the ball toward the top of the column. 6. Hold your breath for 3-5 seconds or for as long as possible. Allow the piston or ball to fall to the bottom of the column. 7. Remove the mouthpiece from your mouth and breathe out normally. 8. Rest for a few seconds and repeat Steps 1 through 7 at least 10 times every 1-2 hours when you are awake. Take your time and take a few normal breaths between deep breaths. 9. The spirometer may include an indicator to show your best effort. Use the indicator as a goal to work toward during each repetition. 10. After each set of 10 deep breaths, practice coughing to be sure your lungs are clear. If you have  an incision (the cut made at the time of surgery), support your incision when coughing by placing a pillow or rolled up towels firmly against it. Once you are able to get out of bed, walk around indoors and cough well. You may stop using the incentive spirometer when instructed by your caregiver.  RISKS AND COMPLICATIONS  Take your time so you do not get dizzy or light-headed.  If you are in pain, you may need to take or ask for pain medication before doing incentive spirometry. It is harder to take a deep breath if you are having pain. AFTER USE  Rest and breathe slowly and easily.  It can be helpful to keep track of a log of your progress. Your  caregiver can provide you with a simple table to help with this. If you are using the spirometer at home, follow these instructions: [place] IF:   You are having difficultly using the spirometer.  You have trouble using the spirometer as often as instructed.  Your pain medication is not giving enough relief while using the spirometer.  You develop fever of 100.5 F (38.1 C) or higher. SEEK IMMEDIATE MEDICAL CARE IF:   You cough up bloody sputum that had not been present before.  You develop fever of 102 F (38.9 C) or greater.  You develop worsening pain at or near the incision site. MAKE SURE YOU:   Understand these instructions.  Will watch your condition.  Will get help right away if you are not doing well or get worse. Document Released: 12/12/2006 Document Revised: 10/24/2011 Document Reviewed: 02/12/2007 [facility] Patient Information 2014 [place], [place].   ________________________________________________________________________

## 2016-12-13 HISTORY — PX: CERVICAL SPINE SURGERY: SHX589

## 2016-12-14 DIAGNOSIS — M47892 Other spondylosis, cervical region: Secondary | ICD-10-CM | POA: Diagnosis not present

## 2016-12-14 DIAGNOSIS — M4712 Other spondylosis with myelopathy, cervical region: Secondary | ICD-10-CM | POA: Diagnosis not present

## 2016-12-19 ENCOUNTER — Encounter (HOSPITAL_COMMUNITY)
Admission: RE | Admit: 2016-12-19 | Discharge: 2016-12-19 | Disposition: A | Discharge status: Home / Self Care | Payer: PPO | Source: Ambulatory Visit | Attending: Orthopedic Surgery | Admitting: Orthopedic Surgery

## 2016-12-19 ENCOUNTER — Ambulatory Visit: Payer: Self-pay | Admitting: Orthopedic Surgery

## 2016-12-19 NOTE — H&P (Signed)
[name] DOB: [birth year] Married / Language: English / Race: White Male Date of Admission:  12/26/2016 CC:  Right knee pain History of Present Illness The patient is a 76 year old male who comes in  for a preoperative History and Physical. The patient is scheduled for a right total knee arthroplasty to be performed by Dr. Dione Plover. Aluisio, MD at [facility] on 12-26-2016. The patient is a 76 year old male who presented for follow up of their knee. The patient is being followed for their right knee pain and osteoarthritis. They are now month(s) out from cortisone injection. Symptoms reported include: pain, swelling, aching, giving way, instability, pain with weightbearing and difficulty ambulating. The patient feels that they are doing poorly and report their pain level to be severe (worse with activity, and at night). Current treatment includes: activity modification and heat, prn. The following medication has been used for pain control: Hydrocodone (Rx from Dr. [name] for his back). He had lumbar surgery in November 2017. The patient has reported improvement of their symptoms with: Cortisone injections. He had the right hip replaced in 1999 by Dr. Wonda Olds, and the left in 2001 by Dr. Wynelle Link. He states that he has been having a lot of pain in his right leg. Pain is mostly in the lateral aspect of the knee and it radiates up towards the mid thigh. He had that right hip replaced in 1998 by Dr. Wonda Olds. He has not had any problems with it at all. The knee does swell at times. The knee feels like it wants to give out. There is no medial-sided pain. He has bone-on-bone arthritis. Injections have not helped. Cortisone helps for a very short amount of time. The most predictive means of long-term improvement in pain and function is going to be total knee arthroplasty. We did discuss that in detail and he wants to proceed with surgery at this time. They have been treated conservatively in the  past for the above stated problem and despite conservative measures, they continue to have progressive pain and severe functional limitations and dysfunction. They have failed non-operative management including home exercise, medications, and injections. It is felt that they would benefit from undergoing total joint replacement. Risks and benefits of the procedure have been discussed with the patient and they elect to proceed with surgery. There are no active contraindications to surgery such as ongoing infection or rapidly progressive neurological disease.  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 Left Leg Gastroesophageal Reflux Disease  Coronary artery disease  Hypertension  Carotid Artery Stenosis  Bilateral Angina Pectoris  Diet-Controlled Diabetes Mellitus  Smoking History   Allergies Morphine Sulfate *ANALGESICS - OPIOID*  VERY SICK  Family History Heart Disease  Brother, Mother. sister and brother Heart disease in male family member before age 68  Heart disease in male family member before age 44  Kidney disease  Father, Sister. sister Diabetes Mellitus  Brother, Mother. sister and brother Hypertension  mother First Degree Relatives  reported Cerebrovascular Accident  Father, Mother.  Social History  Illicit drug use  no Drug/Alcohol Rehab (Previously)  no Alcohol use  Never consumed alcohol. never consumed  alcohol Living situation  live with spouse Current work status  retired Furniture conservator/restorer monthly; does running /  walking Children  1 Pain Contract  no Marital status  married Never consumed alcohol  06/10/2014: Never consumed alcohol Tobacco use  Former smoker, Never smoker. 06/10/2014: smoke(d) 1 pack(s) per day former smoker; smoke(d) 1 pack(s) per day No history of drug/alcohol rehab  Drug/Alcohol Rehab (Currently)  no Not under pain contract  Tobacco / smoke exposure  06/10/2014: no  Medication History  Aspirin (81MG  Tablet Chewable, Oral) Active. Finasteride (5MG  Tablet, Oral) Active. Nitrostat (0.4MG  Tab Sublingual, Sublingual) Active. Pantoprazole Sodium (40MG  Tablet DR, Oral) Active. Vitamin B12 (1000MCG Tablet ER, Oral) Active. Imdur (30MG  Tablet ER 24HR, Oral) Active. Zetia (10MG  Tablet, Oral) Active. Simvastatin (40MG  Tablet, Oral) Active. Oxycodone-Acetaminophen (5-325MG  Tablet, Oral) Active. Colace (100MG  Capsule, Oral) Active. Flexeril (10MG  Tablet, Oral) Active. Detrol LA (4MG  Capsule ER 24HR, Oral) Active.  Past Surgical History  Arthroscopy of Knee  right Spinal Surgery  most recent surgery by Dr. [name] in November 2017 Coronary Artery Bypass Graft  3 vessels Appendectomy  Heart Stents  Total Hip Replacement  bilateral   Review of Systems General Not Present- Chills, Fatigue, Fever, Memory Loss, Night Sweats, Weight Gain and Weight Loss. Skin Not Present- Eczema, Hives, Itching, Lesions and Rash. HEENT Not Present- Dentures, Double Vision, Headache, Hearing Loss, Tinnitus and Visual Loss. Respiratory Not Present- Allergies, Chronic Cough, Coughing up blood, Shortness of breath at rest and Shortness of breath with exertion. Cardiovascular Not Present- Chest Pain, Difficulty Breathing Lying Down, Murmur, Palpitations, Racing/skipping heartbeats and Swelling. Gastrointestinal Not Present- Abdominal Pain, Bloody Stool, Constipation, Diarrhea, Difficulty Swallowing, Heartburn, Jaundice, Loss of appetitie, Nausea and Vomiting. Male Genitourinary  Not Present- Blood in Urine, Discharge, Flank Pain, Incontinence, Painful Urination, Urgency, Urinary frequency, Urinary Retention, Urinating at Night and Weak urinary stream. Musculoskeletal Present- Arm Weakness (pain), Back Pain and Joint Pain. Not Present- Joint Swelling, Morning Stiffness, Muscle Pain, Muscle Weakness and Spasms. Neurological Not Present- Blackout spells, Difficulty with balance, Dizziness, Paralysis, Tremor and Weakness. Psychiatric Not Present- Insomnia.  Vitals Weight: 220 lb Height: 72in Weight was reported by patient. Height was reported by patient. Body Surface Area: 2.22 m Body Mass Index: 29.84 kg/m  Pulse: 84 (Regular)  BP: 128/68 (Sitting, Right Arm, Standard) Rate is regular but with some skipped or ectopic beats   Physical Exam  General Mental Status -Alert, cooperative and good historian. General Appearance-pleasant, Not in acute distress. Orientation-Oriented X3. Build & Nutrition-Well nourished and Well developed.  Head and Neck Head-normocephalic, atraumatic . Neck Global Assessment - supple, no bruit auscultated on the right, no bruit auscultated on the left.  Eye Pupil - Bilateral-Regular and Round. Motion - Bilateral-EOMI.  Chest and Lung Exam Auscultation Breath sounds - clear at anterior chest wall and clear at posterior chest wall. Adventitious sounds - No Adventitious sounds.  Cardiovascular Auscultation Rhythm - Regular rate and rhythm. Heart Sounds - S1 WNL and S2 WNL. Murmurs & Other Heart Sounds: Murmur 1 - Location - Aortic Area. Timing - Mid-systolic. Grade - II/VI. Carotid arteries - No Carotid bruit.  Abdomen Inspection Contour - Generalized mild distention. Palpation/Percussion Tenderness - Abdomen is non-tender to palpation. Rigidity (guarding) - Abdomen is soft. Auscultation Auscultation of the abdomen reveals - Bowel sounds normal.  Male Genitourinary Note: Not done, not pertinent to  present illness   Musculoskeletal Note: The right knee, he is tender along the medial and lateral joint line, moderate patellofemoral crepitus. He  lacks about 5 degrees of extension, flexion back to 120 degrees. No instability noted. No effusion. Distal pulses 2+. Sensation and motor function intact in lower extremities.  Radiographs are reviewed, AP and lateral of the right knee which show that he has bone-on-bone arthritis in the lateral patellofemoral compartments of that knee with slight valgus. AP pelvis, lateral of both hips show that both prostheses are in excellent position, but no periprosthetic abnormalities. No evidence of any polyethylene wear or osteolysis.  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 Danise Mina Cards: Dr. Rockey Situ  Topical TXA - MI, History of Blood Clot  Anesthesia Issues: None  Patient was instructed on what medications to stop prior to surgery.  Please note that at the time of his H&P, there are MRI's pending on his neck and back from Dr. [name]. Waiting on those results to determine wheter to proceed with the elective knee surgery.  Signed electronically by Joelene Millin, III PA-C

## 2016-12-23 DIAGNOSIS — M542 Cervicalgia: Secondary | ICD-10-CM | POA: Diagnosis not present

## 2016-12-26 ENCOUNTER — Inpatient Hospital Stay (HOSPITAL_COMMUNITY): Admission: RE | Admit: 2016-12-26 | Payer: PPO | Source: Ambulatory Visit | Admitting: Orthopedic Surgery

## 2016-12-26 ENCOUNTER — Encounter (HOSPITAL_COMMUNITY): Admission: RE | Payer: Self-pay | Source: Ambulatory Visit

## 2016-12-26 SURGERY — ARTHROPLASTY, KNEE, TOTAL
Anesthesia: Choice | Site: Knee | Laterality: Right

## 2016-12-27 ENCOUNTER — Ambulatory Visit: Payer: PPO

## 2017-01-06 ENCOUNTER — Encounter: Payer: PPO | Admitting: Family Medicine

## 2017-01-23 DIAGNOSIS — H02055 Trichiasis without entropian left lower eyelid: Secondary | ICD-10-CM | POA: Diagnosis not present

## 2017-01-23 DIAGNOSIS — H43813 Vitreous degeneration, bilateral: Secondary | ICD-10-CM | POA: Diagnosis not present

## 2017-02-10 ENCOUNTER — Ambulatory Visit: Payer: Self-pay | Admitting: Orthopedic Surgery

## 2017-02-10 NOTE — H&P (Signed)
[name] DOB: 11-12-1940 Married / Language: English / Race: White Male Date of Admission:  03/06/2017 CC:  Right Knee Pain History of Present Illness  The patient is a 76 year old male who comes in for a preoperative History and Physical. The patient is scheduled for a right total knee arthroplasty to be performed by Dr. Dione Plover. Aluisio, MD at [facility] on 03/06/2017. The patient is a 76 year old male who presented for follow up of their knee. The patient is being followed for their right knee pain and osteoarthritis. They are now month(s) out from cortisone injection. Symptoms reported include: pain, swelling, aching, giving way, instability, pain with weightbearing and difficulty ambulating. The patient feels that they are doing poorly and report their pain level to be severe (worse with activity, and at night). Current treatment includes: activity modification and heat, prn. The following medication has been used for pain control: Hydrocodone (Rx from Dr. [name] for his back). He had lumbar surgery in November 2017. The patient has reported improvement of their symptoms with: Cortisone injections. He had the right hip replaced in 1999 by Dr. Wonda Olds, and the left in 2001 by Dr. Wynelle Link. He states that he has been having a lot of pain in his right leg. Pain is mostly in the lateral aspect of the knee and it radiates up towards the mid thigh. He had that right hip replaced in 1998 by Dr. Wonda Olds. He has not had any problems with it at all. The knee does swell at times. The knee feels like it wants to give out. There is no medial-sided pain. He has bone-on-bone arthritis. Injections have not helped. Cortisone helps for a very short amount of time. The most predictive means of long-term improvement in pain and function is going to be total knee arthroplasty. We did discuss that in detail and he wants to proceed with surgery at this time. They have been treated conservatively in the  past for the above stated problem and despite conservative measures, they continue to have progressive pain and severe functional limitations and dysfunction. They have failed non-operative management including home exercise, medications, and injections. It is felt that they would benefit from undergoing total joint replacement. Risks and benefits of the procedure have been discussed with the patient and they elect to proceed with surgery. There are no active contraindications to surgery such as ongoing infection or rapidly progressive neurological disease.  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 Left Leg Gastroesophageal Reflux Disease  Coronary artery disease  Hypertension  Carotid Artery Stenosis  Bilateral Angina Pectoris  Diet-Controlled Diabetes Mellitus  Smoking History  Pneumonia  Past History Aneurysm - AAA - Followed by Dr. Rockey Situ via Ultrasound  Allergies No Known Drug Allergies  Intolerance Morphine Sulfate *ANALGESICS - OPIOID*  VERY SICK  Family History Heart Disease  Brother, Mother. sister and brother Heart disease in male family member before age 28  Heart disease in male family member before age 13  Kidney disease  Father, Sister. sister Diabetes Mellitus  Brother, Mother. sister and brother Hypertension  mother  First Degree Relatives  reported Cerebrovascular Accident  Father, Mother.  Social History  Illicit drug use  no Drug/Alcohol Rehab  (Previously)  no Alcohol use  Never consumed alcohol. never consumed alcohol Living situation  live with spouse Current work status  retired Furniture conservator/restorer monthly; does running / walking Children  1 Pain Contract  no Marital status  married Never consumed alcohol  06/10/2014: Never consumed alcohol Tobacco use  Former smoker, Never smoker. 06/10/2014: smoke(d) 1 pack(s) per day former smoker; smoke(d) 1 pack(s) per day No history of drug/alcohol rehab  Drug/Alcohol Rehab (Currently)  no Not under pain contract  Tobacco / smoke exposure  06/10/2014: no  Medication History Aspirin (81MG  Tablet Chewable, Oral) Active. Finasteride (5MG  Tablet, Oral) Active. Simvastatin (40MG  Tablet, Oral) Active. Pantoprazole Sodium (40MG  Tablet DR, Oral) Active. Zetia (10MG  Tablet, Oral) Active. Imdur (30MG  Tablet ER 24HR, Oral) Active. Detrol LA (4MG  Capsule ER 24HR, Oral) Active. Hydrocodone-Acetaminophen (5-325MG  Tablet, Oral) Active. Colace (100MG  Capsule, Oral) Active. Vitamin B12 (1000MCG Tablet ER, Oral) Active. Nitrostat (0.4MG  Tab Sublingual, Sublingual) Active. Flexeril (10MG  Tablet, Oral) Active.  Past Surgical History Arthroscopy of Knee  right Spinal Surgery  most recent surgery by Dr. [name] in November 2017 Coronary Artery Bypass Graft  3 vessels Appendectomy  Heart Stents  1 Total Hip Replacement  bilateral Cervical Neck Surgery  Date: 12/2016. Cataract Extraction-Bilateral   Review of Systems General Not Present- Chills, Fatigue, Fever, Memory Loss, Night Sweats, Weight Gain and Weight Loss. Skin Not Present- Eczema, Hives, Itching, Lesions and Rash. HEENT Present- Tinnitus. Not Present- Dentures, Double Vision, Headache, Hearing Loss and Visual Loss. Respiratory Not Present- Allergies, Chronic Cough, Coughing up blood, Shortness of breath at rest and Shortness of breath with exertion. Cardiovascular Not Present- Chest Pain, Difficulty  Breathing Lying Down, Murmur, Palpitations, Racing/skipping heartbeats and Swelling. Gastrointestinal Not Present- Abdominal Pain, Bloody Stool, Constipation, Diarrhea, Difficulty Swallowing, Heartburn, Jaundice, Loss of appetitie, Nausea and Vomiting. Male Genitourinary Not Present- Blood in Urine, Discharge, Flank Pain, Incontinence, Painful Urination, Urgency, Urinary frequency, Urinary Retention, Urinating at Night and Weak urinary stream. Musculoskeletal Present- Joint Pain. Not Present- Back Pain, Joint Swelling, Morning Stiffness, Muscle Pain, Muscle Weakness and Spasms. Neurological Not Present- Blackout spells, Difficulty with balance, Dizziness, Paralysis, Tremor and Weakness. Psychiatric Not Present- Insomnia.  Vitals  Weight: 223 lb Height: 72in Weight was reported by patient. Height was reported by patient. Body Surface Area: 2.23 m Body Mass Index: 30.24 kg/m  Pulse: 88 (Regular)  BP: 118/64 (Sitting, Right Arm, Standard)   Physical Exam General Mental Status -Alert, cooperative and good historian. General Appearance-pleasant, Not in acute distress. Orientation-Oriented X3. Build & Nutrition-Well nourished and Well developed.  Head and Neck Head-normocephalic, atraumatic . Neck Global Assessment - supple, no bruit auscultated on the right, no bruit auscultated on the left.  Eye Pupil - Bilateral-Regular and Round. Motion - Bilateral-EOMI.  ENMT Note: upper and lower denture plates   Chest and Lung Exam Auscultation Breath sounds - clear at anterior chest wall and clear at posterior chest wall. Adventitious sounds - No Adventitious sounds.  Cardiovascular Auscultation Rhythm - Regular rate and rhythm. Heart Sounds - S1 WNL and S2 WNL. Murmurs & Other Heart Sounds - Auscultation of the heart reveals - No Murmurs.  Abdomen Palpation/Percussion Tenderness - Abdomen is non-tender to palpation. Rigidity (guarding) - Abdomen is  soft. Auscultation Auscultation of the abdomen reveals - Bowel sounds normal.  Male Genitourinary Note: Not done, not pertinent to present illness  Musculoskeletal Note: The right knee, he is tender along the medial and lateral joint line, moderate patellofemoral crepitus. He lacks about 5 degrees of extension, flexion back to 120 degrees. No instability noted. No effusion. Distal pulses 2+. Sensation and motor function intact in lower extremities.  Radiographs are reviewed, AP and lateral of the right knee which show that he has bone-on-bone arthritis in the lateral patellofemoral compartments of that knee with slight valgus. AP pelvis, lateral of both hips show that both prostheses are in excellent position, but no periprosthetic abnormalities. No evidence of any polyethylene wear or osteolysis.   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 Danise Mina  Topical TXA - CAD, Stents, CABG  Anesthesia Issues: None  Patient was instructed on what medications to stop prior to surgery.  Signed electronically by Joelene Millin, III PA-C

## 2017-02-10 NOTE — H&P (Signed)
[name] DOB: 05/30/1941 Married / Language: English / Race: White Male Date of Admission:  03/06/2017 CC:  Right Knee Pain History of Present Illness  The patient is a 76 year old male who comes in for a preoperative History and Physical. The patient is scheduled for a right total knee arthroplasty to be performed by Dr. Dione Plover. Aluisio, MD at [facility] on 03/06/2017. The patient is a 76 year old male who presented for follow up of their knee. The patient is being followed for their right knee pain and osteoarthritis. They are now month(s) out from cortisone injection. Symptoms reported include: pain, swelling, aching, giving way, instability, pain with weightbearing and difficulty ambulating. The patient feels that they are doing poorly and report their pain level to be severe (worse with activity, and at night). Current treatment includes: activity modification and heat, prn. The following medication has been used for pain control: Hydrocodone (Rx from Dr. [name] for his back). He had lumbar surgery in November 2017. The patient has reported improvement of their symptoms with: Cortisone injections. He had the right hip replaced in 1999 by Dr. Wonda Olds, and the left in 2001 by Dr. Wynelle Link. He states that he has been having a lot of pain in his right leg. Pain is mostly in the lateral aspect of the knee and it radiates up towards the mid thigh. He had that right hip replaced in 1998 by Dr. Wonda Olds. He has not had any problems with it at all. The knee does swell at times. The knee feels like it wants to give out. There is no medial-sided pain. He has bone-on-bone arthritis. Injections have not helped. Cortisone helps for a very short amount of time. The most predictive means of long-term improvement in pain and function is going to be total knee arthroplasty. We did discuss that in detail and he wants to proceed with surgery at this time. They have been treated conservatively  in the past for the above stated problem and despite conservative measures, they continue to have progressive pain and severe functional limitations and dysfunction. They have failed non-operative management including home exercise, medications, and injections. It is felt that they would benefit from undergoing total joint replacement. Risks and benefits of the procedure have been discussed with the patient and they elect to proceed with surgery. There are no active contraindications to surgery such as ongoing infection or rapidly progressive neurological disease.  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 Left Leg Gastroesophageal Reflux Disease  Coronary artery disease  Hypertension  Carotid Artery Stenosis  Bilateral Angina Pectoris  Diet-Controlled Diabetes Mellitus  Smoking History  Pneumonia  Past History Aneurysm - AAA - Followed by Dr. Rockey Situ via Ultrasound  Allergies No Known Drug Allergies  Intolerance Morphine Sulfate *ANALGESICS - OPIOID*  VERY SICK  Family History Heart Disease  Brother, Mother. sister and brother Heart disease in male family member before age 48  Heart disease in male family member before age 70  Kidney disease  Father, Sister. sister Diabetes Mellitus  Brother, Mother. sister and brother  Hypertension  mother First Degree Relatives  reported Cerebrovascular Accident  Father, Mother.  Social History  Illicit drug use  no Drug/Alcohol Rehab  (Previously)  no Alcohol use  Never consumed alcohol. never consumed alcohol Living situation  live with spouse Current work status  retired Furniture conservator/restorer monthly; does running / walking Children  1 Pain Contract  no Marital status  married Never consumed alcohol  06/10/2014: Never consumed alcohol Tobacco use  Former smoker, Never smoker. 06/10/2014: smoke(d) 1 pack(s) per day former smoker; smoke(d) 1 pack(s) per day No history of drug/alcohol rehab  Drug/Alcohol Rehab (Currently)  no Not under pain contract  Tobacco / smoke exposure  06/10/2014: no  Medication History Aspirin (81MG  Tablet Chewable, Oral) Active. Finasteride (5MG  Tablet, Oral) Active. Simvastatin (40MG  Tablet, Oral) Active. Pantoprazole Sodium (40MG  Tablet DR, Oral) Active. Zetia (10MG  Tablet, Oral) Active. Imdur (30MG  Tablet ER 24HR, Oral) Active. Detrol LA (4MG  Capsule ER 24HR, Oral) Active. Hydrocodone-Acetaminophen (5-325MG  Tablet, Oral) Active. Colace (100MG  Capsule, Oral) Active. Vitamin B12 (1000MCG Tablet ER, Oral) Active. Nitrostat (0.4MG  Tab Sublingual, Sublingual) Active. Flexeril (10MG  Tablet, Oral) Active.  Past Surgical History Arthroscopy of Knee  right Spinal Surgery  most recent surgery by Dr. [name] in November 2017 Coronary Artery Bypass Graft  3 vessels Appendectomy  Heart Stents  1 Total Hip Replacement  bilateral Cervical Neck Surgery  Date: 12/2016. Cataract Extraction-Bilateral   Review of Systems General Not Present- Chills, Fatigue, Fever, Memory Loss, Night Sweats, Weight Gain and Weight Loss. Skin Not Present- Eczema, Hives, Itching, Lesions and Rash. HEENT Present- Tinnitus. Not Present- Dentures, Double Vision, Headache, Hearing Loss and Visual Loss. Respiratory Not Present- Allergies, Chronic Cough, Coughing up blood, Shortness of breath at rest and Shortness of breath with exertion. Cardiovascular Not Present- Chest Pain,  Difficulty Breathing Lying Down, Murmur, Palpitations, Racing/skipping heartbeats and Swelling. Gastrointestinal Not Present- Abdominal Pain, Bloody Stool, Constipation, Diarrhea, Difficulty Swallowing, Heartburn, Jaundice, Loss of appetitie, Nausea and Vomiting. Male Genitourinary Not Present- Blood in Urine, Discharge, Flank Pain, Incontinence, Painful Urination, Urgency, Urinary frequency, Urinary Retention, Urinating at Night and Weak urinary stream. Musculoskeletal Present- Joint Pain. Not Present- Back Pain, Joint Swelling, Morning Stiffness, Muscle Pain, Muscle Weakness and Spasms. Neurological Not Present- Blackout spells, Difficulty with balance, Dizziness, Paralysis, Tremor and Weakness. Psychiatric Not Present- Insomnia.  Vitals  Weight: 223 lb Height: 72in Weight was reported by patient. Height was reported by patient. Body Surface Area: 2.23 m Body Mass Index: 30.24 kg/m  Pulse: 88 (Regular)  BP: 118/64 (Sitting, Right Arm, Standard)   Physical Exam General Mental Status -Alert, cooperative and good historian. General Appearance-pleasant, Not in acute distress. Orientation-Oriented X3. Build & Nutrition-Well nourished and Well developed.  Head and Neck Head-normocephalic, atraumatic . Neck Global Assessment - supple, no bruit auscultated on the right, no bruit auscultated on the left.  Eye Pupil - Bilateral-Regular and Round. Motion - Bilateral-EOMI.  ENMT Note: upper and lower denture plates   Chest and Lung Exam Auscultation Breath sounds - clear at anterior chest wall and clear at posterior chest wall. Adventitious sounds - No Adventitious sounds.  Cardiovascular Auscultation Rhythm - Regular rate and rhythm. Heart Sounds - S1 WNL and S2 WNL. Murmurs & Other Heart Sounds - Auscultation of the heart reveals - No Murmurs.  Abdomen Palpation/Percussion Tenderness - Abdomen is non-tender to palpation. Rigidity (guarding) -  Abdomen is soft. Auscultation Auscultation of the abdomen reveals - Bowel sounds normal.  Male Genitourinary Note: Not done, not pertinent to present  illness   Musculoskeletal Note: The right knee, he is tender along the medial and lateral joint line, moderate patellofemoral crepitus. He lacks about 5 degrees of extension, flexion back to 120 degrees. No instability noted. No effusion. Distal pulses 2+. Sensation and motor function intact in lower extremities.  Radiographs are reviewed, AP and lateral of the right knee which show that he has bone-on-bone arthritis in the lateral patellofemoral compartments of that knee with slight valgus. AP pelvis, lateral of both hips show that both prostheses are in excellent position, but no periprosthetic abnormalities. No evidence of any polyethylene wear or osteolysis.   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 Danise Mina  Topical TXA - CAD, Stents, CABG  Anesthesia Issues: None  Patient was instructed on what medications to stop prior to surgery.  Signed electronically by Joelene Millin, III PA-C

## 2017-02-13 ENCOUNTER — Ambulatory Visit (INDEPENDENT_AMBULATORY_CARE_PROVIDER_SITE_OTHER): Payer: PPO | Admitting: Podiatry

## 2017-02-13 ENCOUNTER — Ambulatory Visit (INDEPENDENT_AMBULATORY_CARE_PROVIDER_SITE_OTHER): Payer: PPO

## 2017-02-13 ENCOUNTER — Encounter: Payer: Self-pay | Admitting: Podiatry

## 2017-02-13 DIAGNOSIS — S92514A Nondisplaced fracture of proximal phalanx of right lesser toe(s), initial encounter for closed fracture: Secondary | ICD-10-CM | POA: Diagnosis not present

## 2017-02-13 DIAGNOSIS — S9031XA Contusion of right foot, initial encounter: Secondary | ICD-10-CM | POA: Diagnosis not present

## 2017-02-13 NOTE — Progress Notes (Signed)
   Subjective:    Patient ID: [name], male    DOB: 1941/04/25, 76 y.o.   MRN: [id number]  HPI: He presents today for a chief complaint of a painful lateral foot right. He states he got a call when [place] in a trailer about 5 days ago. He states his really become sore around the fifth toe and bruised up and was swollen all down the side of the foot. He is scheduled for knee replacement in 23 days on the same side.    Review of Systems  HENT: Positive for tinnitus.   Cardiovascular: Positive for leg swelling.  Musculoskeletal: Positive for arthralgias.  All other systems reviewed and are negative.      Objective:   Physical Exam: Vital signs are stable he is alert and oriented 3. Pulses are palpable. Neurologic sensorium is intact. Deep tendon reflexes are intact. Muscle strength is normal. Orthopedic evaluation demonstrates all joints distal to the ankle for range of motion but crepitation. Cutaneous evaluation demonstrates supple well-hydrated cutis. He does have some ecchymosis to the lateral aspect of the right foot. He has pain on direct palpation of the proximal phalanx base fifth toe right. Rated as taken today demonstrate what appears to be a slightly displaced fracture of the proximal phalanx fifth digit right foot. No other osseous abnormalities are identified.        Assessment & Plan:  Fracture fifth digit right foot. Contusion fifth metatarsal.  Plan: I will allow him follow up with [place] on an as-needed basis explained to him this will take several weeks for this bone to heal he understands that and is amenable to it.

## 2017-02-14 ENCOUNTER — Other Ambulatory Visit: Payer: Self-pay | Admitting: Cardiovascular Disease

## 2017-02-14 DIAGNOSIS — I714 Abdominal aortic aneurysm, without rupture, unspecified: Secondary | ICD-10-CM

## 2017-02-23 ENCOUNTER — Ambulatory Visit: Payer: PPO

## 2017-02-23 ENCOUNTER — Other Ambulatory Visit: Payer: Self-pay | Admitting: Cardiovascular Disease

## 2017-02-23 DIAGNOSIS — I714 Abdominal aortic aneurysm, without rupture, unspecified: Secondary | ICD-10-CM

## 2017-02-24 NOTE — Patient Instructions (Addendum)
[place]  02/24/2017   Your procedure is scheduled on: 03-06-17  Report to [facility] Main  Entrance Report to Admitting at 11:15 AM   Call this number if you have problems the morning of surgery  [phone]   Remember: ONLY 1 PERSON MAY GO WITH YOU TO SHORT STAY TO GET  READY MORNING OF [place].  Do not eat food or drink liquids :After Midnight. You may have a Clear Liquid Diet from Midnight until 7:45AM. After 7:45AM, nothing by mouth     CLEAR LIQUID DIET   Foods Allowed                                                                     Foods Excluded  Coffee and tea, regular and decaf                             liquids that you cannot  Plain Jell-O in any flavor                                             see through such as: Fruit ices (not with fruit pulp)                                     milk, soups, orange juice  Iced Popsicles                                    All solid food Carbonated beverages, regular and diet                                    Cranberry, grape and apple juices Sports drinks like Gatorade Lightly seasoned clear broth or consume(fat free) Sugar, honey syrup  Sample Menu Breakfast                                Lunch                                     Supper Cranberry juice                    Beef broth                            Chicken broth Jell-O                                     Grape juice  Apple juice Coffee or tea                        Jell-O                                      Popsicle                                                Coffee or tea                        Coffee or tea  _____________________________________________________________________     Take these medicines the morning of surgery with A SIP OF WATER: Ezetimibe (Zetia), Isosorbide Mononitrate (Imdur), Pantoprazole (Protonix),  Simvastatin (Zocor)                                You may not have  any metal on your body including hair pins and              piercings  Do not wear jewelry, lotions, powders , deodorant             Men may shave face and neck.   Do not bring valuables to the hospital. [name].  Contacts, dentures or bridgework may not be worn into surgery.  Leave suitcase in the car. After surgery it may be brought to your room.                  Please read over the following fact sheets you were given: _____________________________________________________________________             [facility] - Preparing for Surgery Before surgery, you can play an important role.  Because skin is not sterile, your skin needs to be as free of germs as possible.  You can reduce the number of germs on your skin by washing with CHG (chlorahexidine gluconate) soap before surgery.  CHG is an antiseptic cleaner which kills germs and bonds with the skin to continue killing germs even after washing. Please DO NOT use if you have an allergy to CHG or antibacterial soaps.  If your skin becomes reddened/irritated stop using the CHG and inform your nurse when you arrive at Short Stay. Do not shave (including legs and underarms) for at least 48 hours prior to the first CHG shower.  You may shave your face/neck. Please follow these instructions carefully:  1.  Shower with CHG Soap the night before surgery and the  morning of Surgery.  2.  If you choose to wash your hair, wash your hair first as usual with your  normal  shampoo.  3.  After you shampoo, rinse your hair and body thoroughly to remove the  shampoo.                           4.  Use CHG as you would any other liquid soap.  You can apply chg directly  to the skin and wash  Gently with a scrungie or clean washcloth.  5.  Apply the CHG Soap to your body ONLY FROM THE NECK DOWN.   Do not use on face/ open                           Wound or open sores. Avoid contact with  eyes, ears mouth and genitals (private parts).                       Wash face,  Genitals (private parts) with your normal soap.             6.  Wash thoroughly, paying special attention to the area where your surgery  will be performed.  7.  Thoroughly rinse your body with warm water from the neck down.  8.  DO NOT shower/wash with your normal soap after using and rinsing off  the CHG Soap.                9.  Pat yourself dry with a clean towel.            10.  Wear clean pajamas.            11.  Place clean sheets on your bed the night of your first shower and do not  sleep with pets. Day of Surgery : Do not apply any lotions/deodorants the morning of surgery.  Please wear clean clothes to the hospital/surgery center.  FAILURE TO FOLLOW THESE INSTRUCTIONS MAY RESULT IN THE CANCELLATION OF YOUR SURGERY PATIENT SIGNATURE_________________________________  NURSE SIGNATURE__________________________________  ________________________________________________________________________   [name]  An incentive spirometer is a tool that can help keep your lungs clear and active. This tool measures how well you are filling your lungs with each breath. Taking long deep breaths may help reverse or decrease the chance of developing breathing (pulmonary) problems (especially infection) following:  A long period of time when you are unable to move or be active. BEFORE THE PROCEDURE   If the spirometer includes an indicator to show your best effort, your nurse or respiratory therapist will set it to a desired goal.  If possible, sit up straight or lean slightly forward. Try not to slouch.  Hold the incentive spirometer in an upright position. INSTRUCTIONS FOR USE  1. Sit on the edge of your bed if possible, or sit up as far as you can in bed or on a chair. 2. Hold the incentive spirometer in an upright position. 3. Breathe out normally. 4. Place the mouthpiece in your mouth and seal your  lips tightly around it. 5. Breathe in slowly and as deeply as possible, raising the piston or the ball toward the top of the column. 6. Hold your breath for 3-5 seconds or for as long as possible. Allow the piston or ball to fall to the bottom of the column. 7. Remove the mouthpiece from your mouth and breathe out normally. 8. Rest for a few seconds and repeat Steps 1 through 7 at least 10 times every 1-2 hours when you are awake. Take your time and take a few normal breaths between deep breaths. 9. The spirometer may include an indicator to show your best effort. Use the indicator as a goal to work toward during each repetition. 10. After each set of 10 deep breaths, practice coughing to be sure your lungs are clear. If you have an incision (the cut made at the time of  surgery), support your incision when coughing by placing a pillow or rolled up towels firmly against it. Once you are able to get out of bed, walk around indoors and cough well. You may stop using the incentive spirometer when instructed by your caregiver.  RISKS AND COMPLICATIONS  Take your time so you do not get dizzy or light-headed.  If you are in pain, you may need to take or ask for pain medication before doing incentive spirometry. It is harder to take a deep breath if you are having pain. AFTER USE  Rest and breathe slowly and easily.  It can be helpful to keep track of a log of your progress. Your caregiver can provide you with a simple table to help with this. If you are using the spirometer at home, follow these instructions: [place] IF:   You are having difficultly using the spirometer.  You have trouble using the spirometer as often as instructed.  Your pain medication is not giving enough relief while using the spirometer.  You develop fever of 100.5 F (38.1 C) or higher. SEEK IMMEDIATE MEDICAL CARE IF:   You cough up bloody sputum that had not been present before.  You develop fever of 102 F  (38.9 C) or greater.  You develop worsening pain at or near the incision site. MAKE SURE YOU:   Understand these instructions.  Will watch your condition.  Will get help right away if you are not doing well or get worse. Document Released: 12/12/2006 Document Revised: 10/24/2011 Document Reviewed: 02/12/2007 ExitCare Patient Information 2014 ExitCare, [place].   ________________________________________________________________________  WHAT IS A BLOOD TRANSFUSION? Blood Transfusion Information  A transfusion is the replacement of blood or some of its parts. Blood is made up of multiple cells which provide different functions.  Red blood cells carry oxygen and are used for blood loss replacement.  White blood cells fight against infection.  Platelets control bleeding.  Plasma helps clot blood.  Other blood products are available for specialized needs, such as hemophilia or other clotting disorders. BEFORE THE TRANSFUSION  Who gives blood for transfusions?   Healthy volunteers who are fully evaluated to make sure their blood is safe. This is blood bank blood. Transfusion therapy is the safest it has ever been in the practice of medicine. Before blood is taken from a donor, a complete history is taken to make sure that person has no history of diseases nor engages in risky social behavior (examples are intravenous drug use or sexual activity with multiple partners). The donor's travel history is screened to minimize risk of transmitting infections, such as malaria. The donated blood is tested for signs of infectious diseases, such as HIV and hepatitis. The blood is then tested to be sure it is compatible with you in order to minimize the chance of a transfusion reaction. If you or a relative donates blood, this is often done in anticipation of surgery and is not appropriate for emergency situations. It takes many days to process the donated blood. RISKS AND COMPLICATIONS Although  transfusion therapy is very safe and saves many lives, the main dangers of transfusion include:   Getting an infectious disease.  Developing a transfusion reaction. This is an allergic reaction to something in the blood you were given. Every precaution is taken to prevent this. The decision to have a blood transfusion has been considered carefully by your caregiver before blood is given. Blood is not given unless the benefits outweigh the risks. AFTER THE  TRANSFUSION  Right after receiving a blood transfusion, you will usually feel much better and more energetic. This is especially true if your red blood cells have gotten low (anemic). The transfusion raises the level of the red blood cells which carry oxygen, and this usually causes an energy increase.  The nurse administering the transfusion will monitor you carefully for complications. HOME CARE INSTRUCTIONS  No special instructions are needed after a transfusion. You may find your energy is better. Speak with your caregiver about any limitations on activity for underlying diseases you may have. SEEK MEDICAL CARE IF:   Your condition is not improving after your transfusion.  You develop redness or irritation at the intravenous (IV) site. SEEK IMMEDIATE MEDICAL CARE IF:  Any of the following symptoms occur over the next 12 hours:  Shaking chills.  You have a temperature by mouth above 102 F (38.9 C), not controlled by medicine.  Chest, back, or muscle pain.  People around you feel you are not acting correctly or are confused.  Shortness of breath or difficulty breathing.  Dizziness and fainting.  You get a rash or develop hives.  You have a decrease in urine output.  Your urine turns a dark color or changes to pink, red, or brown. Any of the following symptoms occur over the next 10 days:  You have a temperature by mouth above 102 F (38.9 C), not controlled by medicine.  Shortness of breath.  Weakness after normal  activity.  The white part of the eye turns yellow (jaundice).  You have a decrease in the amount of urine or are urinating less often.  Your urine turns a dark color or changes to pink, red, or brown. Document Released: 07/29/2000 Document Revised: 10/24/2011 Document Reviewed: 03/17/2008 [facility] Patient Information 2014 [place], [place].  _______________________________________________________________________

## 2017-02-27 ENCOUNTER — Encounter (HOSPITAL_COMMUNITY)
Admission: RE | Admit: 2017-02-27 | Discharge: 2017-02-27 | Disposition: A | Discharge status: Home / Self Care | Payer: PPO | Source: Ambulatory Visit | Attending: Orthopedic Surgery | Admitting: Orthopedic Surgery

## 2017-02-27 ENCOUNTER — Encounter (HOSPITAL_COMMUNITY): Payer: Self-pay

## 2017-02-27 DIAGNOSIS — M1711 Unilateral primary osteoarthritis, right knee: Secondary | ICD-10-CM | POA: Insufficient documentation

## 2017-02-27 DIAGNOSIS — Z01812 Encounter for preprocedural laboratory examination: Secondary | ICD-10-CM | POA: Diagnosis not present

## 2017-02-27 LAB — COMPREHENSIVE METABOLIC PANEL
ALT: 17 U/L (ref 17–63)
AST: 21 U/L (ref 15–41)
Albumin: 4.1 g/dL (ref 3.5–5.0)
Alkaline Phosphatase: 39 U/L (ref 38–126)
Anion gap: 5 (ref 5–15)
BUN: 16 mg/dL (ref 6–20)
CO2: 27 mmol/L (ref 22–32)
Calcium: 8.6 mg/dL — ABNORMAL LOW (ref 8.9–10.3)
Chloride: 109 mmol/L (ref 101–111)
Creatinine, Ser: 1.19 mg/dL (ref 0.61–1.24)
GFR calc Af Amer: >60 mL/min (ref >60)
GFR calc non Af Amer: 58 mL/min — ABNORMAL LOW (ref >60)
Glucose, Bld: 168 mg/dL — ABNORMAL HIGH (ref 65–99)
Potassium: 4.3 mmol/L (ref 3.5–5.1)
Sodium: 141 mmol/L (ref 135–145)
Total Bilirubin: 0.6 mg/dL (ref 0.3–1.2)
Total Protein: 6.7 g/dL (ref 6.5–8.1)

## 2017-02-27 LAB — ABO/RH: ABO/RH(D): A NEG

## 2017-02-27 LAB — PROTIME-INR
INR: 0.99
Prothrombin Time: 13.1 seconds (ref 11.4–15.2)

## 2017-02-27 LAB — CBC
HCT: 40.7 % (ref 39.0–52.0)
Hemoglobin: 14 g/dL (ref 13.0–17.0)
MCH: 31.7 pg (ref 26.0–34.0)
MCHC: 34.4 g/dL (ref 30.0–36.0)
MCV: 92.1 fL (ref 78.0–100.0)
Platelets: 186 10*3/uL (ref 150–400)
RBC: 4.42 MIL/uL (ref 4.22–5.81)
RDW: 12.3 % (ref 11.5–15.5)
WBC: 5 10*3/uL (ref 4.0–10.5)

## 2017-02-27 LAB — APTT: aPTT: 35 seconds (ref 24–36)

## 2017-02-27 LAB — SURGICAL PCR SCREEN
MRSA, PCR: NEGATIVE
Staphylococcus aureus: NEGATIVE

## 2017-02-27 NOTE — Progress Notes (Signed)
02-27-17... Pt indicated at PAT appt that he had discontinued Aspirin, as well as Vitamin B-12 per MD's order on 02-23-17.

## 2017-02-27 NOTE — Progress Notes (Signed)
11-01-16 (EPIC) EKG, Surgical clearance by Dr. Rockey Situ

## 2017-02-28 ENCOUNTER — Encounter: Payer: Self-pay | Admitting: Primary Care

## 2017-02-28 ENCOUNTER — Ambulatory Visit (INDEPENDENT_AMBULATORY_CARE_PROVIDER_SITE_OTHER): Payer: PPO | Admitting: Primary Care

## 2017-02-28 VITALS — BP 140/74 | HR 99 | Temp 97.7°F | Ht 72.0 in | Wt 228.0 lb

## 2017-02-28 DIAGNOSIS — R21 Rash and other nonspecific skin eruption: Secondary | ICD-10-CM | POA: Diagnosis not present

## 2017-02-28 LAB — HEMOGLOBIN A1C
Hgb A1c MFr Bld: 6.5 % — ABNORMAL HIGH (ref 4.8–5.6)
Mean Plasma Glucose: 140 mg/dL

## 2017-02-28 MED ORDER — TRIAMCINOLONE ACETONIDE 0.5 % EX OINT: 1.0000 "application " | TOPICAL_OINTMENT | Freq: Two times a day (BID) | CUTANEOUS | 0 refills | Status: DC

## 2017-02-28 NOTE — Patient Instructions (Signed)
You may use the Triamcinolone ointment twice daily as needed for itching and rash.  Try taking Zyrtec or Benadryl at bedtime as needed for itching.  Wash all bed sheets/linens. Be sure to wear insect repellent when outdoors.  It was a pleasure meeting you!

## 2017-02-28 NOTE — Progress Notes (Signed)
Subjective:    Patient ID: [name], male    DOB: 1941-01-14, 76 y.o.   MRN: [id number]  HPI  [name] is a 76 year old male who presents today with a chief complaint of rash. His rash is located to the left upper extremity, left anterior chest wall, and right anterior chest wall that he first noticed five days ago. His rash is itchy. He denies pain. No one in the house is itching. He thinks he's seen a few insects in [place] He has seen a few spiders in the barn. He's tried using an old Rx of Triamcinolone 0.1% cream, calamine lotion without much improvement. He's not taken anything OTC for his symptoms.   He denies fevers, chills, body aches.   Review of Systems  Constitutional: Negative for fatigue and fever.  Respiratory: Negative for shortness of breath.   Cardiovascular: Negative for chest pain.  Skin: Positive for rash.  Neurological: Negative for headaches.       Past Medical History:  Diagnosis Date  . BENIGN PROSTATIC HYPERTROPHY, WITH URINARY OBSTRUCTION 09/06/2007  . 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 Diabetes mellitus without complication ([place])    diet controled per dr note  . 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 DVT, HX OF 1998  . GERD 04/30/2007  . Headache    hx migraines  . History of hiatal hernia   . History of shingles   . HYPERLIPIDEMIA 04/26/2007  . HYPERTENSION 04/30/2007  . INGUINAL HERNIA, RIGHT 05/26/2010  . LOC OSTEOARTHROS NOT SPEC PRIM/SEC LOWER LEG 11/05/2009  . Lumbar disc disease with radiculopathy   . Myocardial infarction ([place]) 1989  . OSTEOARTHRITIS 04/26/2007  . 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 Pneumonia 07/2014   [facility] hospitalization  . PSA, INCREASED 07/09/2008  . Spinal stenosis of lumbar region      Social History   Social History  . Marital status: Married    Spouse name: [name]  .  Number of children: [name]  . Years of education: [name]   Occupational History  . Not on file.   Social History Main Topics  . Smoking status: Former Smoker    Packs/day: 1.00    Years: 25.00    Types: Cigarettes    Quit date: 08/15/1988  . Smokeless tobacco: Never Used  . Alcohol use No  . Drug use: No  . Sexual activity: Yes   Other Topics Concern  . Not on file   Social History Narrative   Married, wife [name], for 25 years in 2016. 1 daughter and 3 grandkids from first marriage.    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    Diet: good water, fruits/vegetables daily    Past Surgical History:  Procedure Laterality Date  . APPENDECTOMY     rupture  . CARDIAC CATHETERIZATION [name] 07/07/2015   Procedure: Left Heart Cath and Cors/Grafts Angiography;  Surgeon: [name], MD;  Location: [place] [facility];  Service: Cardiovascular;  Laterality: [name];  . CATARACT EXTRACTION, BILATERAL    . COLONOSCOPY  03/2010   HP polyp, diverticulosis, rpt 10 yrs ([name])  . CORONARY ARTERY BYPASS GRAFT  1990   3 vessel   . CYSTOSCOPY  12/23/10  [facility]  . KNEE ARTHROSCOPY Right    x2  . LAMINOTOMY  1986   L5/S1 lumbar laminotomy for two ruptured discs/fusion  . LUMBAR LAMINECTOMY/DECOMPRESSION MICRODISCECTOMY [name] 07/13/2016   Procedure: LUMBAR TWO-THREE, LUMBAR THREE-FOUR, LUMBAR FOUR-FIVE LAMINECTOMY AND FORAMINOTOMY;  Surgeon: [name], MD;  Location: [place];  Service: Neurosurgery;  Laterality: [name];  LAMINECTOMY AND FORAMINOTOMY L2-L3, L3-L4,L4-L5  . TOTAL HIP ARTHROPLASTY Bilateral 1999,2000    Family History  Problem Relation Age of Onset  . Stroke Mother   . Heart attack Mother   . Diabetes Mother   . Stroke Father   . Heart disease Father   . Kidney disease Father        PCKD  . Cancer Sister        throat  . Diabetes Brother   .  Kidney disease Sister        PCKD  . Cancer Other        5/7 nephews with lung cancer    Allergies  Allergen Reactions  . Morphine Nausea Only and Other (See Comments)    Irritability   . Vioxx [Rofecoxib] Other (See Comments)    Bleeding out    Current Outpatient Prescriptions on File Prior to Visit  Medication Sig Dispense Refill  . aspirin 81 MG tablet Take 81 mg by mouth daily.     . cyclobenzaprine (FLEXERIL) 10 MG tablet Take 1 tablet (10 mg total) by mouth 3 (three) times daily as needed for muscle spasms. 50 tablet 1  . docusate sodium (COLACE) 100 MG capsule Take 1 capsule (100 mg total) by mouth 2 (two) times daily. 60 capsule 0  . ezetimibe (ZETIA) 10 MG tablet TAKE ONE TABLET BY MOUTH EVERY DAY 90 tablet 3  . finasteride (PROSCAR) 5 MG tablet Take 5 mg by mouth daily.      Marland Kitchen HYDROcodone-acetaminophen (NORCO/VICODIN) 5-325 MG tablet Take 1 tablet by mouth 4 (four) times daily as needed for moderate pain.     . isosorbide mononitrate (IMDUR) 30 MG 24 hr tablet Take 1 tablet (30 mg total) by mouth daily. 90 tablet 3  . nitroGLYCERIN (NITROSTAT) 0.4 MG SL tablet Place 0.4 mg under the tongue every 5 (five) minutes as needed for chest pain.    . pantoprazole (PROTONIX) 40 MG tablet Take 40 mg by mouth daily.    . simvastatin (ZOCOR) 40 MG tablet TAKE ONE TABLET BY MOUTH EVERY DAY 90 tablet 3  . tolterodine (DETROL LA) 4 MG 24 hr capsule Take 4 mg by mouth daily.    . vitamin B-12 (CYANOCOBALAMIN) 500 MCG tablet Take 1 tablet (500 mcg total) by mouth daily.     No current facility-administered medications on file prior to visit.     BP 140/74   Pulse 99   Temp 97.7 F (36.5 C) (Oral)   Ht 6' (1.829 m)   Wt 228 lb (103.4 kg)   SpO2 95%   BMI 30.92 kg/m    Objective:   Physical Exam  Constitutional: He appears well-nourished.  Neck: Neck supple.  Cardiovascular: Normal rate.   Pulmonary/Chest: Effort normal.  Skin: Skin is warm and dry.  5-6 small, raised,  rounded, mildly erythematous spots to right anterior upper chest, left anterior upper chest, left lateral chest wall, left upper extremity. No vesicles.          Assessment & Plan:  Insect Bites:  Located to upper body as indicated above. Exam today consistent for benign insect bites, could be  chiggers or mosquitos, doesn't appear to be spider bites or herpes zoster. No evidence of surrounding cellulitis to any of the bite surfaces. Reassurance provided today as he was nervous for herpes zoster. Rx for Triamcinolone cream provided as his has expired. Discussed Zyrtec HS for itching and resolve.  Follow up PRN.  Sheral Flow, NP

## 2017-03-01 ENCOUNTER — Encounter: Payer: Self-pay | Admitting: Family Medicine

## 2017-03-01 ENCOUNTER — Ambulatory Visit (INDEPENDENT_AMBULATORY_CARE_PROVIDER_SITE_OTHER): Payer: PPO | Admitting: Family Medicine

## 2017-03-01 DIAGNOSIS — T07XXXA Unspecified multiple injuries, initial encounter: Secondary | ICD-10-CM | POA: Diagnosis not present

## 2017-03-01 DIAGNOSIS — L508 Other urticaria: Secondary | ICD-10-CM | POA: Diagnosis not present

## 2017-03-01 DIAGNOSIS — R21 Rash and other nonspecific skin eruption: Secondary | ICD-10-CM | POA: Diagnosis not present

## 2017-03-01 MED ORDER — DEXAMETHASONE SODIUM PHOSPHATE 10 MG/ML IJ SOLN
10.0000 mg | Freq: Once | INTRAMUSCULAR | Status: AC
  Administered 2017-03-01: 10 mg via INTRAMUSCULAR

## 2017-03-01 MED ORDER — DEXAMETHASONE SODIUM PHOSPHATE 10 MG/ML IJ SOLN: 10.0000 mg | Freq: Once | INTRAMUSCULAR | Status: DC

## 2017-03-01 NOTE — Patient Instructions (Signed)
Nice to meet you. Please keep [place] updated with your rash.

## 2017-03-01 NOTE — Progress Notes (Signed)
Subjective:   Patient ID: [name], male    DOB: [birth year], 76 y.o.   MRN: [id number]  [name] is a pleasant 76 y.o. year old male who presents to clinic today with Rash  on 03/01/2017  HPI:  Rash- Saw Allie Bossier for this complaint yesterday.  Note reviewed.  Itchy diffuse rash on chest, left upper extremity, possible insect bites at home.  Did not feel this was zoster.  Given rx for triamcinolone cream and advised zyrtec as needed for itching.  He feels zyrtec has helped but triamcinolone has not. Current Outpatient Prescriptions on File Prior to Visit  Medication Sig Dispense Refill  . aspirin 81 MG tablet Take 81 mg by mouth daily.     . cyclobenzaprine (FLEXERIL) 10 MG tablet Take 1 tablet (10 mg total) by mouth 3 (three) times daily as needed for muscle spasms. 50 tablet 1  . docusate sodium (COLACE) 100 MG capsule Take 1 capsule (100 mg total) by mouth 2 (two) times daily. 60 capsule 0  . ezetimibe (ZETIA) 10 MG tablet TAKE ONE TABLET BY MOUTH EVERY DAY 90 tablet 3  . finasteride (PROSCAR) 5 MG tablet Take 5 mg by mouth daily.      Marland Kitchen HYDROcodone-acetaminophen (NORCO/VICODIN) 5-325 MG tablet Take 1 tablet by mouth 4 (four) times daily as needed for moderate pain.     . isosorbide mononitrate (IMDUR) 30 MG 24 hr tablet Take 1 tablet (30 mg total) by mouth daily. 90 tablet 3  . nitroGLYCERIN (NITROSTAT) 0.4 MG SL tablet Place 0.4 mg under the tongue every 5 (five) minutes as needed for chest pain.    . pantoprazole (PROTONIX) 40 MG tablet Take 40 mg by mouth daily.    . simvastatin (ZOCOR) 40 MG tablet TAKE ONE TABLET BY MOUTH EVERY DAY 90 tablet 3  . tolterodine (DETROL LA) 4 MG 24 hr capsule Take 4 mg by mouth daily.    Marland Kitchen triamcinolone ointment (KENALOG) 0.5 % Apply 1 application topically 2 (two) times daily. 30 g 0  . vitamin B-12 (CYANOCOBALAMIN) 500 MCG tablet Take 1 tablet (500 mcg total) by mouth daily.     No current facility-administered  medications on file prior to visit.     Allergies  Allergen Reactions  . Morphine Nausea Only and Other (See Comments)    Irritability   . Vioxx [Rofecoxib] Other (See Comments)    Bleeding out    Past Medical History:  Diagnosis Date  . BENIGN PROSTATIC HYPERTROPHY, WITH URINARY OBSTRUCTION 09/06/2007  . 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 Diabetes mellitus without complication ([place])    diet controled per dr note  . 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 DVT, HX OF 1998  . GERD 04/30/2007  . Headache    hx migraines  . History of hiatal hernia   . History of shingles   . HYPERLIPIDEMIA 04/26/2007  . HYPERTENSION 04/30/2007  . INGUINAL HERNIA, RIGHT 05/26/2010  . LOC OSTEOARTHROS NOT SPEC PRIM/SEC LOWER LEG 11/05/2009  . Lumbar disc disease with radiculopathy   . Myocardial infarction ([place]) 1989  . OSTEOARTHRITIS 04/26/2007  . 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 Pneumonia 07/2014   [facility] hospitalization  . PSA, INCREASED 07/09/2008  . Spinal stenosis of lumbar region     Past Surgical History:  Procedure Laterality Date  . APPENDECTOMY     rupture  . CARDIAC CATHETERIZATION  [name] 07/07/2015   Procedure: Left Heart Cath and Cors/Grafts Angiography;  Surgeon: [name], MD;  Location: [facility];  Service: Cardiovascular;  Laterality: [name];  . CATARACT EXTRACTION, BILATERAL    . COLONOSCOPY  03/2010   HP polyp, diverticulosis, rpt 10 yrs ([name])  . CORONARY ARTERY BYPASS GRAFT  1990   3 vessel   . CYSTOSCOPY  12/23/10   [facility]  . KNEE ARTHROSCOPY Right    x2  . LAMINOTOMY  1986   L5/S1 lumbar laminotomy for two ruptured discs/fusion  . LUMBAR LAMINECTOMY/DECOMPRESSION MICRODISCECTOMY [name] 07/13/2016   Procedure: LUMBAR TWO-THREE, LUMBAR THREE-FOUR, LUMBAR FOUR-FIVE LAMINECTOMY AND FORAMINOTOMY;  Surgeon: [name], MD;  Location: [facility];  Service:  Neurosurgery;  Laterality: [name];  LAMINECTOMY AND FORAMINOTOMY L2-L3, L3-L4,L4-L5  . TOTAL HIP ARTHROPLASTY Bilateral 1999,2000    Family History  Problem Relation Age of Onset  . Stroke Mother   . Heart attack Mother   . Diabetes Mother   . Stroke Father   . Heart disease Father   . Kidney disease Father        PCKD  . Cancer Sister        throat  . Diabetes Brother   . Kidney disease Sister        PCKD  . Cancer Other        5/7 nephews with lung cancer    Social History   Social History  . Marital status: Married    Spouse name: [name]  . Number of children: [name]  . Years of education: [name]   Occupational History  . Not on file.   Social History Main Topics  . Smoking status: Former Smoker    Packs/day: 1.00    Years: 25.00    Types: Cigarettes    Quit date: 08/15/1988  . Smokeless tobacco: Never Used  . Alcohol use No  . Drug use: No  . Sexual activity: Yes   Other Topics Concern  . Not on file   Social History Narrative   Married, wife [name], for 25 years in 2016. 1 daughter and 3 grandkids from first marriage.    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    Diet: good water, fruits/vegetables daily   The PMH, PSH, Social History, Family History, Medications, and allergies have been reviewed in [facility], and have been updated if relevant.   Review of Systems  Constitutional: Negative.   HENT: Negative.   Eyes: Negative.   Respiratory: Negative.   Cardiovascular: Negative.   Gastrointestinal: Negative.   Endocrine: Negative.   Genitourinary: Negative.   Musculoskeletal: Negative.   Skin: Positive for rash.  Allergic/Immunologic: Negative.   Neurological: Negative.   Hematological: Negative.   Psychiatric/Behavioral: Negative.   All other systems reviewed and are negative.      Objective:    BP 122/68   Pulse (!)  102   Temp 97.9 F (36.6 C)   Ht 6' (1.829 m)   Wt 221 lb (100.2 kg)   SpO2 99%   BMI 29.97 kg/m    Physical Exam  Constitutional: He is oriented to person, place, and time. He appears well-developed and well-nourished. No distress.  HENT:  Head: Normocephalic and atraumatic.  Eyes: Conjunctivae are normal.  Cardiovascular: Normal rate.   Pulmonary/Chest: Effort normal.  Musculoskeletal: Normal range of motion.  Neurological: He  is alert and oriented to person, place, and time. No cranial nerve deficit.  Skin: He is not diaphoretic.  5-6 small, raised, rounded, mildly erythematous spots to right anterior upper chest, right upper arm, left anterior upper chest, left lateral chest wall, left upper extremity. No vesicles.    Psychiatric: He has a normal mood and affect. His behavior is normal. Judgment and thought content normal.  Nursing note and vitals reviewed.         Assessment & Plan:   Rash No Follow-up on file.

## 2017-03-01 NOTE — Addendum Note (Signed)
Addended by: Mady Haagensen on: 03/01/2017 12:19 PM   Modules accepted: Orders

## 2017-03-01 NOTE — Assessment & Plan Note (Signed)
Agree with Anda Kraft- this does seem like bug bites. Rash is crossing the midline, so cannot be zoster. No signs of cellulitis. IM decadron given in office today to decrease itching and inflammation, continue topical triamcinolone and oral antihistamine. Call or return to clinic prn if these symptoms worsen or fail to improve as anticipated.

## 2017-03-02 ENCOUNTER — Ambulatory Visit: Payer: Self-pay | Admitting: Family Medicine

## 2017-03-02 ENCOUNTER — Encounter: Payer: Self-pay | Admitting: Family Medicine

## 2017-03-06 ENCOUNTER — Inpatient Hospital Stay (HOSPITAL_COMMUNITY)
Admission: RE | Admit: 2017-03-06 | Discharge: 2017-03-08 | DRG: 470 | Disposition: A | Discharge status: Home Health | Payer: PPO | Source: Ambulatory Visit | Attending: Orthopedic Surgery | Admitting: Orthopedic Surgery

## 2017-03-06 ENCOUNTER — Encounter (HOSPITAL_COMMUNITY): Payer: Self-pay | Admitting: *Deleted

## 2017-03-06 ENCOUNTER — Inpatient Hospital Stay (HOSPITAL_COMMUNITY): Payer: PPO | Admitting: Anesthesiology

## 2017-03-06 ENCOUNTER — Encounter (HOSPITAL_COMMUNITY)
Admission: RE | Disposition: A | Discharge status: Home Health | Payer: Self-pay | Source: Ambulatory Visit | Attending: Orthopedic Surgery

## 2017-03-06 DIAGNOSIS — Z9841 Cataract extraction status, right eye: Secondary | ICD-10-CM | POA: Diagnosis not present

## 2017-03-06 DIAGNOSIS — M179 Osteoarthritis of knee, unspecified: Secondary | ICD-10-CM

## 2017-03-06 DIAGNOSIS — I25810 Atherosclerosis of coronary artery bypass graft(s) without angina pectoris: Secondary | ICD-10-CM | POA: Diagnosis not present

## 2017-03-06 DIAGNOSIS — Z79899 Other long term (current) drug therapy: Secondary | ICD-10-CM

## 2017-03-06 DIAGNOSIS — G8918 Other acute postprocedural pain: Secondary | ICD-10-CM | POA: Diagnosis not present

## 2017-03-06 DIAGNOSIS — Z8249 Family history of ischemic heart disease and other diseases of the circulatory system: Secondary | ICD-10-CM

## 2017-03-06 DIAGNOSIS — E785 Hyperlipidemia, unspecified: Secondary | ICD-10-CM | POA: Diagnosis not present

## 2017-03-06 DIAGNOSIS — I25119 Atherosclerotic heart disease of native coronary artery with unspecified angina pectoris: Secondary | ICD-10-CM | POA: Diagnosis not present

## 2017-03-06 DIAGNOSIS — Z951 Presence of aortocoronary bypass graft: Secondary | ICD-10-CM | POA: Diagnosis not present

## 2017-03-06 DIAGNOSIS — E1151 Type 2 diabetes mellitus with diabetic peripheral angiopathy without gangrene: Secondary | ICD-10-CM | POA: Diagnosis not present

## 2017-03-06 DIAGNOSIS — Z9842 Cataract extraction status, left eye: Secondary | ICD-10-CM | POA: Diagnosis not present

## 2017-03-06 DIAGNOSIS — M069 Rheumatoid arthritis, unspecified: Secondary | ICD-10-CM | POA: Diagnosis not present

## 2017-03-06 DIAGNOSIS — M1711 Unilateral primary osteoarthritis, right knee: Secondary | ICD-10-CM | POA: Diagnosis not present

## 2017-03-06 DIAGNOSIS — M1710 Unilateral primary osteoarthritis, unspecified knee: Secondary | ICD-10-CM | POA: Diagnosis present

## 2017-03-06 DIAGNOSIS — I252 Old myocardial infarction: Secondary | ICD-10-CM | POA: Diagnosis not present

## 2017-03-06 DIAGNOSIS — I10 Essential (primary) hypertension: Secondary | ICD-10-CM | POA: Diagnosis not present

## 2017-03-06 DIAGNOSIS — Z87891 Personal history of nicotine dependence: Secondary | ICD-10-CM | POA: Diagnosis not present

## 2017-03-06 DIAGNOSIS — Z833 Family history of diabetes mellitus: Secondary | ICD-10-CM | POA: Diagnosis not present

## 2017-03-06 DIAGNOSIS — Z7982 Long term (current) use of aspirin: Secondary | ICD-10-CM

## 2017-03-06 DIAGNOSIS — Z955 Presence of coronary angioplasty implant and graft: Secondary | ICD-10-CM

## 2017-03-06 DIAGNOSIS — Z96643 Presence of artificial hip joint, bilateral: Secondary | ICD-10-CM | POA: Diagnosis not present

## 2017-03-06 DIAGNOSIS — M48061 Spinal stenosis, lumbar region without neurogenic claudication: Secondary | ICD-10-CM | POA: Diagnosis not present

## 2017-03-06 HISTORY — PX: TOTAL KNEE ARTHROPLASTY: SHX125

## 2017-03-06 LAB — TYPE AND SCREEN
ABO/RH(D): A NEG
Antibody Screen: NEGATIVE

## 2017-03-06 SURGERY — ARTHROPLASTY, KNEE, TOTAL
Anesthesia: Spinal | Site: Knee | Laterality: Right

## 2017-03-06 MED ORDER — MIDAZOLAM HCL 2 MG/2ML IJ SOLN
INTRAMUSCULAR | Status: AC
  Filled 2017-03-06: qty 2

## 2017-03-06 MED ORDER — NITROGLYCERIN 0.4 MG SL SUBL: 0.4000 mg | SUBLINGUAL_TABLET | SUBLINGUAL | Status: DC | PRN

## 2017-03-06 MED ORDER — HYDROMORPHONE HCL-NACL 0.5-0.9 MG/ML-% IV SOSY
0.5000 mg | PREFILLED_SYRINGE | INTRAVENOUS | Status: DC | PRN
  Administered 2017-03-06: 0.5 mg via INTRAVENOUS
  Filled 2017-03-06: qty 1

## 2017-03-06 MED ORDER — METOCLOPRAMIDE HCL 5 MG/ML IJ SOLN: 5.0000 mg | Freq: Three times a day (TID) | INTRAMUSCULAR | Status: DC | PRN

## 2017-03-06 MED ORDER — FLEET ENEMA 7-19 GM/118ML RE ENEM: 1.0000 | ENEMA | Freq: Once | RECTAL | Status: DC | PRN

## 2017-03-06 MED ORDER — ISOSORBIDE MONONITRATE ER 30 MG PO TB24
30.0000 mg | ORAL_TABLET | Freq: Every day | ORAL | Status: DC
  Administered 2017-03-07 – 2017-03-08 (×2): 30 mg via ORAL
  Filled 2017-03-06 (×2): qty 1

## 2017-03-06 MED ORDER — SODIUM CHLORIDE 0.9 % IR SOLN
Status: DC | PRN
  Administered 2017-03-06: 1000 mL

## 2017-03-06 MED ORDER — SODIUM CHLORIDE 0.9 % IJ SOLN
INTRAMUSCULAR | Status: AC
  Filled 2017-03-06: qty 50

## 2017-03-06 MED ORDER — BUPIVACAINE LIPOSOME 1.3 % IJ SUSP
INTRAMUSCULAR | Status: DC | PRN
  Administered 2017-03-06: 20 mL

## 2017-03-06 MED ORDER — CEFAZOLIN SODIUM-DEXTROSE 2-4 GM/100ML-% IV SOLN
2.0000 g | INTRAVENOUS | Status: AC
  Administered 2017-03-06: 2 g via INTRAVENOUS

## 2017-03-06 MED ORDER — LACTATED RINGERS IV SOLN
INTRAVENOUS | Status: DC
  Administered 2017-03-06 (×3): via INTRAVENOUS

## 2017-03-06 MED ORDER — SODIUM CHLORIDE 0.9 % IJ SOLN
INTRAMUSCULAR | Status: AC
  Filled 2017-03-06: qty 10

## 2017-03-06 MED ORDER — CHLORHEXIDINE GLUCONATE 4 % EX LIQD: 60.0000 mL | Freq: Once | CUTANEOUS | Status: DC

## 2017-03-06 MED ORDER — MIDAZOLAM HCL 5 MG/5ML IJ SOLN
INTRAMUSCULAR | Status: DC | PRN
  Administered 2017-03-06 (×2): 1 mg via INTRAVENOUS

## 2017-03-06 MED ORDER — METHOCARBAMOL 500 MG PO TABS
500.0000 mg | ORAL_TABLET | Freq: Four times a day (QID) | ORAL | Status: DC | PRN
  Administered 2017-03-07 – 2017-03-08 (×4): 500 mg via ORAL
  Filled 2017-03-06 (×4): qty 1

## 2017-03-06 MED ORDER — HYDROMORPHONE HCL-NACL 0.5-0.9 MG/ML-% IV SOSY: 0.2500 mg | PREFILLED_SYRINGE | INTRAVENOUS | Status: DC | PRN

## 2017-03-06 MED ORDER — DEXAMETHASONE SODIUM PHOSPHATE 10 MG/ML IJ SOLN
10.0000 mg | Freq: Once | INTRAMUSCULAR | Status: AC
Start: 2017-03-07 — End: 2017-03-07
  Administered 2017-03-07: 10 mg via INTRAVENOUS
  Filled 2017-03-06: qty 1

## 2017-03-06 MED ORDER — GABAPENTIN 300 MG PO CAPS
ORAL_CAPSULE | ORAL | Status: AC
  Administered 2017-03-06: 300 mg via ORAL
  Filled 2017-03-06: qty 1

## 2017-03-06 MED ORDER — GABAPENTIN 300 MG PO CAPS
300.0000 mg | ORAL_CAPSULE | Freq: Once | ORAL | Status: AC
  Administered 2017-03-06: 300 mg via ORAL

## 2017-03-06 MED ORDER — ONDANSETRON HCL 4 MG/2ML IJ SOLN
INTRAMUSCULAR | Status: AC
  Filled 2017-03-06: qty 2

## 2017-03-06 MED ORDER — FENTANYL CITRATE (PF) 100 MCG/2ML IJ SOLN
50.0000 ug | INTRAMUSCULAR | Status: AC | PRN
  Administered 2017-03-06 (×3): 50 ug via INTRAVENOUS

## 2017-03-06 MED ORDER — PHENOL 1.4 % MT LIQD
1.0000 | OROMUCOSAL | Status: DC | PRN
  Filled 2017-03-06: qty 177

## 2017-03-06 MED ORDER — BUPIVACAINE-EPINEPHRINE (PF) 0.25% -1:200000 IJ SOLN
INTRAMUSCULAR | Status: DC | PRN
  Administered 2017-03-06: 30 mL via PERINEURAL

## 2017-03-06 MED ORDER — ACETAMINOPHEN 500 MG PO TABS
1000.0000 mg | ORAL_TABLET | Freq: Four times a day (QID) | ORAL | Status: AC
  Administered 2017-03-06 – 2017-03-07 (×4): 1000 mg via ORAL
  Filled 2017-03-06 (×4): qty 2

## 2017-03-06 MED ORDER — PROPOFOL 10 MG/ML IV BOLUS
INTRAVENOUS | Status: AC
  Filled 2017-03-06: qty 60

## 2017-03-06 MED ORDER — TRAMADOL HCL 50 MG PO TABS
50.0000 mg | ORAL_TABLET | Freq: Four times a day (QID) | ORAL | Status: DC | PRN
  Administered 2017-03-06: 100 mg via ORAL
  Filled 2017-03-06: qty 2

## 2017-03-06 MED ORDER — DIPHENHYDRAMINE HCL 12.5 MG/5ML PO ELIX: 12.5000 mg | ORAL_SOLUTION | ORAL | Status: DC | PRN

## 2017-03-06 MED ORDER — ONDANSETRON HCL 4 MG/2ML IJ SOLN
INTRAMUSCULAR | Status: DC | PRN
  Administered 2017-03-06: 4 mg via INTRAVENOUS

## 2017-03-06 MED ORDER — CEFAZOLIN SODIUM-DEXTROSE 2-4 GM/100ML-% IV SOLN
INTRAVENOUS | Status: AC
  Filled 2017-03-06: qty 100

## 2017-03-06 MED ORDER — SODIUM CHLORIDE 0.9 % IV SOLN
INTRAVENOUS | Status: DC
  Administered 2017-03-06: 100 mL/h via INTRAVENOUS
  Administered 2017-03-07: 04:00:00 via INTRAVENOUS

## 2017-03-06 MED ORDER — MENTHOL 3 MG MT LOZG: 1.0000 | LOZENGE | OROMUCOSAL | Status: DC | PRN

## 2017-03-06 MED ORDER — DEXAMETHASONE SODIUM PHOSPHATE 10 MG/ML IJ SOLN
INTRAMUSCULAR | Status: AC
  Filled 2017-03-06: qty 1

## 2017-03-06 MED ORDER — MIDAZOLAM HCL 5 MG/ML IJ SOLN
1.0000 mg | INTRAMUSCULAR | Status: DC | PRN
  Administered 2017-03-06: 1 mg via INTRAVENOUS

## 2017-03-06 MED ORDER — ACETAMINOPHEN 10 MG/ML IV SOLN
1000.0000 mg | Freq: Once | INTRAVENOUS | Status: AC
  Administered 2017-03-06: 1000 mg via INTRAVENOUS

## 2017-03-06 MED ORDER — PROPOFOL 500 MG/50ML IV EMUL
INTRAVENOUS | Status: DC | PRN
  Administered 2017-03-06: 100 ug/kg/min via INTRAVENOUS

## 2017-03-06 MED ORDER — SIMVASTATIN 20 MG PO TABS
40.0000 mg | ORAL_TABLET | Freq: Every day | ORAL | Status: DC
  Administered 2017-03-07 – 2017-03-08 (×2): 40 mg via ORAL
  Filled 2017-03-06 (×2): qty 2

## 2017-03-06 MED ORDER — CEFAZOLIN SODIUM-DEXTROSE 2-4 GM/100ML-% IV SOLN
2.0000 g | Freq: Four times a day (QID) | INTRAVENOUS | Status: AC
  Administered 2017-03-06 – 2017-03-07 (×2): 2 g via INTRAVENOUS
  Filled 2017-03-06 (×2): qty 100

## 2017-03-06 MED ORDER — TRANEXAMIC ACID 1000 MG/10ML IV SOLN
INTRAVENOUS | Status: AC | PRN
  Administered 2017-03-06: 2000 mg via TOPICAL

## 2017-03-06 MED ORDER — OXYCODONE HCL 5 MG PO TABS
5.0000 mg | ORAL_TABLET | ORAL | Status: DC | PRN
  Administered 2017-03-06 (×2): 5 mg via ORAL
  Administered 2017-03-06 – 2017-03-07 (×4): 10 mg via ORAL
  Administered 2017-03-07 – 2017-03-08 (×3): 5 mg via ORAL
  Administered 2017-03-08 (×2): 10 mg via ORAL
  Filled 2017-03-06: qty 2
  Filled 2017-03-06: qty 1
  Filled 2017-03-06 (×3): qty 2
  Filled 2017-03-06: qty 1
  Filled 2017-03-06 (×2): qty 2
  Filled 2017-03-06: qty 1
  Filled 2017-03-06: qty 2

## 2017-03-06 MED ORDER — FENTANYL CITRATE (PF) 100 MCG/2ML IJ SOLN
INTRAMUSCULAR | Status: AC
  Administered 2017-03-06: 50 ug via INTRAVENOUS
  Filled 2017-03-06: qty 2

## 2017-03-06 MED ORDER — CYCLOBENZAPRINE HCL 10 MG PO TABS
10.0000 mg | ORAL_TABLET | Freq: Three times a day (TID) | ORAL | Status: DC | PRN
  Administered 2017-03-06 – 2017-03-07 (×2): 10 mg via ORAL
  Filled 2017-03-06 (×2): qty 1

## 2017-03-06 MED ORDER — METHOCARBAMOL 1000 MG/10ML IJ SOLN
500.0000 mg | Freq: Four times a day (QID) | INTRAVENOUS | Status: DC | PRN
  Administered 2017-03-06: 500 mg via INTRAVENOUS
  Filled 2017-03-06: qty 550

## 2017-03-06 MED ORDER — BUPIVACAINE LIPOSOME 1.3 % IJ SUSP
20.0000 mL | Freq: Once | INTRAMUSCULAR | Status: DC
  Filled 2017-03-06: qty 20

## 2017-03-06 MED ORDER — SODIUM CHLORIDE 0.9 % IJ SOLN
INTRAMUSCULAR | Status: DC | PRN
  Administered 2017-03-06: 60 mL

## 2017-03-06 MED ORDER — METOCLOPRAMIDE HCL 5 MG PO TABS: 5.0000 mg | ORAL_TABLET | Freq: Three times a day (TID) | ORAL | Status: DC | PRN

## 2017-03-06 MED ORDER — PROMETHAZINE HCL 25 MG/ML IJ SOLN: 6.2500 mg | INTRAMUSCULAR | Status: DC | PRN

## 2017-03-06 MED ORDER — ONDANSETRON HCL 4 MG/2ML IJ SOLN: 4.0000 mg | Freq: Four times a day (QID) | INTRAMUSCULAR | Status: DC | PRN

## 2017-03-06 MED ORDER — POLYETHYLENE GLYCOL 3350 17 G PO PACK: 17.0000 g | PACK | Freq: Every day | ORAL | Status: DC | PRN

## 2017-03-06 MED ORDER — TRANEXAMIC ACID 1000 MG/10ML IV SOLN
2000.0000 mg | Freq: Once | INTRAVENOUS | Status: DC
  Filled 2017-03-06: qty 20

## 2017-03-06 MED ORDER — PANTOPRAZOLE SODIUM 40 MG PO TBEC
40.0000 mg | DELAYED_RELEASE_TABLET | Freq: Every day | ORAL | Status: DC
  Administered 2017-03-07 – 2017-03-08 (×2): 40 mg via ORAL
  Filled 2017-03-06 (×2): qty 1

## 2017-03-06 MED ORDER — LIDOCAINE 2% (20 MG/ML) 5 ML SYRINGE
INTRAMUSCULAR | Status: AC
  Filled 2017-03-06: qty 5

## 2017-03-06 MED ORDER — DOCUSATE SODIUM 100 MG PO CAPS
100.0000 mg | ORAL_CAPSULE | Freq: Two times a day (BID) | ORAL | Status: DC
  Administered 2017-03-06 – 2017-03-08 (×4): 100 mg via ORAL
  Filled 2017-03-06 (×4): qty 1

## 2017-03-06 MED ORDER — FINASTERIDE 5 MG PO TABS
5.0000 mg | ORAL_TABLET | Freq: Every day | ORAL | Status: DC
  Administered 2017-03-07 – 2017-03-08 (×2): 5 mg via ORAL
  Filled 2017-03-06 (×2): qty 1

## 2017-03-06 MED ORDER — PROPOFOL 10 MG/ML IV BOLUS
INTRAVENOUS | Status: AC
  Filled 2017-03-06: qty 20

## 2017-03-06 MED ORDER — BISACODYL 10 MG RE SUPP: 10.0000 mg | Freq: Every day | RECTAL | Status: DC | PRN

## 2017-03-06 MED ORDER — ACETAMINOPHEN 650 MG RE SUPP: 650.0000 mg | Freq: Four times a day (QID) | RECTAL | Status: DC | PRN

## 2017-03-06 MED ORDER — EZETIMIBE 10 MG PO TABS
10.0000 mg | ORAL_TABLET | Freq: Every day | ORAL | Status: DC
  Administered 2017-03-07 – 2017-03-08 (×2): 10 mg via ORAL
  Filled 2017-03-06 (×2): qty 1

## 2017-03-06 MED ORDER — PROPOFOL 10 MG/ML IV BOLUS
INTRAVENOUS | Status: AC
Start: 2017-03-06 — End: 2017-03-06
  Filled 2017-03-06: qty 20

## 2017-03-06 MED ORDER — FESOTERODINE FUMARATE ER 8 MG PO TB24
8.0000 mg | ORAL_TABLET | Freq: Every day | ORAL | Status: DC
  Administered 2017-03-07 – 2017-03-08 (×2): 8 mg via ORAL
  Filled 2017-03-06 (×2): qty 1

## 2017-03-06 MED ORDER — ACETAMINOPHEN 10 MG/ML IV SOLN
INTRAVENOUS | Status: AC
  Filled 2017-03-06: qty 100

## 2017-03-06 MED ORDER — FENTANYL CITRATE (PF) 100 MCG/2ML IJ SOLN
INTRAMUSCULAR | Status: AC
  Filled 2017-03-06: qty 2

## 2017-03-06 MED ORDER — ONDANSETRON HCL 4 MG PO TABS: 4.0000 mg | ORAL_TABLET | Freq: Four times a day (QID) | ORAL | Status: DC | PRN

## 2017-03-06 MED ORDER — ACETAMINOPHEN 325 MG PO TABS
650.0000 mg | ORAL_TABLET | Freq: Four times a day (QID) | ORAL | Status: DC | PRN
Start: 2017-03-07 — End: 2017-03-08
  Administered 2017-03-08 (×2): 650 mg via ORAL
  Filled 2017-03-06 (×2): qty 2

## 2017-03-06 MED ORDER — RIVAROXABAN 10 MG PO TABS
10.0000 mg | ORAL_TABLET | Freq: Every day | ORAL | Status: DC
  Administered 2017-03-07 – 2017-03-08 (×2): 10 mg via ORAL
  Filled 2017-03-06 (×2): qty 1

## 2017-03-06 MED ORDER — DEXAMETHASONE SODIUM PHOSPHATE 10 MG/ML IJ SOLN
10.0000 mg | Freq: Once | INTRAMUSCULAR | Status: AC
  Administered 2017-03-06: 10 mg via INTRAVENOUS

## 2017-03-06 SURGICAL SUPPLY — 49 items
BAG DECANTER FOR FLEXI CONT (MISCELLANEOUS) ×2 IMPLANT
BAG SPEC THK2 15X12 ZIP CLS (MISCELLANEOUS) ×1
BAG ZIPLOCK 12X15 (MISCELLANEOUS) ×2 IMPLANT
BANDAGE ACE 6X5 VEL STRL LF (GAUZE/BANDAGES/DRESSINGS) ×2 IMPLANT
BLADE SAG 18X100X1.27 (BLADE) ×2 IMPLANT
BLADE SAW SGTL 11.0X1.19X90.0M (BLADE) ×2 IMPLANT
BOWL SMART MIX CTS (DISPOSABLE) ×2 IMPLANT
CAP KNEE TOTAL 3 SIGMA ×1 IMPLANT
CEMENT HV SMART SET (Cement) ×4 IMPLANT
COVER SURGICAL LIGHT HANDLE (MISCELLANEOUS) ×2 IMPLANT
CUFF TOURN SGL QUICK 34 (TOURNIQUET CUFF) ×2
CUFF TRNQT CYL 34X4X40X1 (TOURNIQUET CUFF) ×1 IMPLANT
DECANTER SPIKE VIAL GLASS SM (MISCELLANEOUS) ×2 IMPLANT
DRAPE U-SHAPE 47X51 STRL (DRAPES) ×2 IMPLANT
DRSG ADAPTIC 3X8 NADH LF (GAUZE/BANDAGES/DRESSINGS) ×2 IMPLANT
DRSG PAD ABDOMINAL 8X10 ST (GAUZE/BANDAGES/DRESSINGS) ×2 IMPLANT
DURAPREP 26ML APPLICATOR (WOUND CARE) ×2 IMPLANT
ELECT REM PT RETURN 15FT ADLT (MISCELLANEOUS) ×2 IMPLANT
EVACUATOR 1/8 PVC DRAIN (DRAIN) ×2 IMPLANT
GAUZE SPONGE 4X4 12PLY STRL (GAUZE/BANDAGES/DRESSINGS) ×2 IMPLANT
GLOVE BIO SURGEON STRL SZ7.5 (GLOVE) IMPLANT
GLOVE BIO SURGEON STRL SZ8 (GLOVE) ×2 IMPLANT
GLOVE BIOGEL PI IND STRL 6.5 (GLOVE) IMPLANT
GLOVE BIOGEL PI IND STRL 8 (GLOVE) ×1 IMPLANT
GLOVE BIOGEL PI INDICATOR 6.5 (GLOVE) ×4
GLOVE BIOGEL PI INDICATOR 8 (GLOVE) ×1
GLOVE SURG SS PI 6.5 STRL IVOR (GLOVE) IMPLANT
GOWN STRL REUS W/TWL LRG LVL3 (GOWN DISPOSABLE) ×2 IMPLANT
GOWN STRL REUS W/TWL XL LVL3 (GOWN DISPOSABLE) IMPLANT
HANDPIECE INTERPULSE COAX TIP (DISPOSABLE) ×2
IMMOBILIZER KNEE 20 (SOFTGOODS) ×2
IMMOBILIZER KNEE 20 THIGH 36 (SOFTGOODS) ×1 IMPLANT
MANIFOLD NEPTUNE II (INSTRUMENTS) ×2 IMPLANT
NS IRRIG 1000ML POUR BTL (IV SOLUTION) ×2 IMPLANT
PACK TOTAL KNEE CUSTOM (KITS) ×2 IMPLANT
PADDING CAST COTTON 6X4 STRL (CAST SUPPLIES) ×4 IMPLANT
POSITIONER SURGICAL ARM (MISCELLANEOUS) ×2 IMPLANT
SET HNDPC FAN SPRY TIP SCT (DISPOSABLE) ×1 IMPLANT
STRIP CLOSURE SKIN 1/2X4 (GAUZE/BANDAGES/DRESSINGS) ×3 IMPLANT
SUT MNCRL AB 4-0 PS2 18 (SUTURE) ×2 IMPLANT
SUT STRATAFIX 0 PDS 27 VIOLET (SUTURE) ×2
SUT VIC AB 2-0 CT1 27 (SUTURE) ×6
SUT VIC AB 2-0 CT1 TAPERPNT 27 (SUTURE) ×3 IMPLANT
SUTURE STRATFX 0 PDS 27 VIOLET (SUTURE) ×1 IMPLANT
SYR 30ML LL (SYRINGE) ×4 IMPLANT
TRAY FOLEY W/METER SILVER 16FR (SET/KITS/TRAYS/PACK) ×2 IMPLANT
WATER STERILE IRR 1000ML POUR (IV SOLUTION) ×4 IMPLANT
WRAP KNEE MAXI GEL POST OP (GAUZE/BANDAGES/DRESSINGS) ×2 IMPLANT
YANKAUER SUCT BULB TIP 10FT TU (MISCELLANEOUS) ×2 IMPLANT

## 2017-03-06 NOTE — Anesthesia Procedure Notes (Signed)
Anesthesia Regional Block: Adductor canal block   Pre-Anesthetic Checklist: ,, timeout performed, Correct Patient, Correct Site, Correct Laterality, Correct Procedure, Correct Position, site marked, Risks and benefits discussed,  Surgical consent,  Pre-op evaluation,  At surgeon's request and post-op pain management  Laterality: Right  Prep: chloraprep       Needles:  Injection technique: Single-shot  Needle Type: Echogenic Needle     Needle Length: 9cm      Additional Needles:   Procedures: ultrasound guided,,,,,,,,  Narrative:  Start time: 03/06/2017 12:35 PM End time: 03/06/2017 12:42 PM Injection made incrementally with aspirations every 5 mL.  Performed by: Personally  Anesthesiologist: Wally Shevchenko  Additional Notes: Patient tolerated the procedure well without complications

## 2017-03-06 NOTE — Discharge Instructions (Addendum)
° °Dr. Frank Aluisio °Total Joint Specialist °[facility] °[address] °[place], [place] [address] °[phone] ° °TOTAL KNEE REPLACEMENT POSTOPERATIVE DIRECTIONS ° °Knee Rehabilitation, Guidelines Following Surgery  °Results after knee surgery are often greatly improved when you follow the exercise, range of motion and muscle strengthening exercises prescribed by your doctor. Safety measures are also important to protect the knee from further injury. Any time any of these exercises cause you to have increased pain or swelling in your knee joint, decrease the amount until you are comfortable again and slowly increase them. If you have problems or questions, call your caregiver or physical therapist for advice.  ° °HOME CARE INSTRUCTIONS  °Remove items at home which could result in a fall. This includes throw rugs or furniture in walking pathways.  °· ICE to the affected knee every three hours for 30 minutes at a time and then as needed for pain and swelling.  Continue to use ice on the knee for pain and swelling from surgery. You may notice swelling that will progress down to the foot and ankle.  This is normal after surgery.  Elevate the leg when you are not up walking on it.   °· Continue to use the breathing machine which will help keep your temperature down.  It is common for your temperature to cycle up and down following surgery, especially at night when you are not up moving around and exerting yourself.  The breathing machine keeps your lungs expanded and your temperature down. °· Do not place pillow under knee, focus on keeping the knee straight while resting ° °DIET °You may resume your previous home diet once your are discharged from the hospital. ° °DRESSING / WOUND CARE / SHOWERING °You may shower 3 days after surgery, but keep the wounds dry during showering.  You may use an occlusive plastic wrap (Press'n Seal for example), NO SOAKING/SUBMERGING IN THE BATHTUB.  If the  bandage gets wet, change with a clean dry gauze.  If the incision gets wet, pat the wound dry with a clean towel. °You may start showering once you are discharged home but do not submerge the incision under water. Just pat the incision dry and apply a dry gauze dressing on daily. °Change the surgical dressing daily and reapply a dry dressing each time. ° °ACTIVITY °Walk with your walker as instructed. °Use walker as long as suggested by your caregivers. °Avoid periods of inactivity such as sitting longer than an hour when not asleep. This helps prevent blood clots.  °You may resume a sexual relationship in one month or when given the OK by your doctor.  °You may return to work once you are cleared by your doctor.  °Do not drive a car for 6 weeks or until released by you surgeon.  °Do not drive while taking narcotics. ° °WEIGHT BEARING °Weight bearing as tolerated with assist device (walker, cane, etc) as directed, use it as long as suggested by your surgeon or therapist, typically at least 4-6 weeks. ° °POSTOPERATIVE CONSTIPATION PROTOCOL °Constipation - defined medically as fewer than three stools per week and severe constipation as less than one stool per week. ° °One of the most common issues patients have following surgery is constipation.  Even if you have a regular bowel pattern at home, your normal regimen is likely to be disrupted due to multiple reasons following surgery.  Combination of anesthesia, postoperative narcotics, change in appetite and fluid intake all can affect your bowels.    In order to avoid complications following surgery, here are some recommendations in order to help you during your recovery period. ° °Colace (docusate) - Pick up an over-the-counter form of Colace or another stool softener and take twice a day as long as you are requiring postoperative pain medications.  Take with a full glass of water daily.  If you experience loose stools or diarrhea, hold the colace until you stool forms  back up.  If your symptoms do not get better within 1 week or if they get worse, check with your doctor. ° °Dulcolax (bisacodyl) - Pick up over-the-counter and take as directed by the product packaging as needed to assist with the movement of your bowels.  Take with a full glass of water.  Use this product as needed if not relieved by Colace only.  ° °MiraLax (polyethylene glycol) - Pick up over-the-counter to have on hand.  MiraLax is a solution that will increase the amount of water in your bowels to assist with bowel movements.  Take as directed and can mix with a glass of water, juice, soda, coffee, or tea.  Take if you go more than two days without a movement. °Do not use MiraLax more than once per day. Call your doctor if you are still constipated or irregular after using this medication for 7 days in a row. ° °If you continue to have problems with postoperative constipation, please contact the office for further assistance and recommendations.  If you experience "the worst abdominal pain ever" or develop nausea or vomiting, please contact the office immediatly for further recommendations for treatment. ° °ITCHING ° If you experience itching with your medications, try taking only a single pain pill, or even half a pain pill at a time.  You can also use Benadryl over the counter for itching or also to help with sleep.  ° °TED HOSE STOCKINGS °Wear the elastic stockings on both legs for three weeks following surgery during the day but you may remove then at night for sleeping. ° °MEDICATIONS °See your medication summary on the “After Visit Summary” that the nursing staff will review with you prior to discharge.  You may have some home medications which will be placed on hold until you complete the course of blood thinner medication.  It is important for you to complete the blood thinner medication as prescribed by your surgeon.  Continue your approved medications as instructed at time of  discharge. ° °PRECAUTIONS °If you experience chest pain or shortness of breath - call 911 immediately for transfer to the hospital emergency department.  °If you develop a fever greater that 101 F, purulent drainage from wound, increased redness or drainage from wound, foul odor from the wound/dressing, or calf pain - CONTACT YOUR SURGEON.   °                                                °FOLLOW-UP APPOINTMENTS °Make sure you keep all of your appointments after your operation with your surgeon and caregivers. You should call the office at the above phone number and make an appointment for approximately two weeks after the date of your surgery or on the date instructed by your surgeon outlined in the "After Visit Summary". ° ° °RANGE OF MOTION AND STRENGTHENING EXERCISES  °Rehabilitation of the knee is important following a knee injury or   an operation. After just a few days of immobilization, the muscles of the thigh which control the knee become weakened and shrink (atrophy). Knee exercises are designed to build up the tone and strength of the thigh muscles and to improve knee motion. Often times heat used for twenty to thirty minutes before working out will loosen up your tissues and help with improving the range of motion but do not use heat for the first two weeks following surgery. These exercises can be done on a training (exercise) mat, on the floor, on a table or on a bed. Use what ever works the best and is most comfortable for you Knee exercises include:  °Leg Lifts - While your knee is still immobilized in a splint or cast, you can do straight leg raises. Lift the leg to 60 degrees, hold for 3 sec, and slowly lower the leg. Repeat 10-20 times 2-3 times daily. Perform this exercise against resistance later as your knee gets better.  °Quad and Hamstring Sets - Tighten up the muscle on the front of the thigh (Quad) and hold for 5-10 sec. Repeat this 10-20 times hourly. Hamstring sets are done by pushing the  foot backward against an object and holding for 5-10 sec. Repeat as with quad sets.  °· Leg Slides: Lying on your back, slowly slide your foot toward your buttocks, bending your knee up off the floor (only go as far as is comfortable). Then slowly slide your foot back down until your leg is flat on the floor again. °· Angel Wings: Lying on your back spread your legs to the side as far apart as you can without causing discomfort.  °A rehabilitation program following serious knee injuries can speed recovery and prevent re-injury in the future due to weakened muscles. Contact your doctor or a physical therapist for more information on knee rehabilitation.  ° °IF YOU ARE TRANSFERRED TO A SKILLED REHAB FACILITY °If the patient is transferred to a skilled rehab facility following release from the hospital, a list of the current medications will be sent to the facility for the patient to continue.  When discharged from the skilled rehab facility, please have the facility set up the patient's Home Health Physical Therapy prior to being released. Also, the skilled facility will be responsible for providing the patient with their medications at time of release from the facility to include their pain medication, the muscle relaxants, and their blood thinner medication. If the patient is still at the rehab facility at time of the two week follow up appointment, the skilled rehab facility will also need to assist the patient in arranging follow up appointment in our office and any transportation needs. ° °MAKE SURE YOU:  °Understand these instructions.  °Get help right away if you are not doing well or get worse.  ° ° °Pick up stool softner and laxative for home use following surgery while on pain medications. °Do not submerge incision under water. °Please use good hand washing techniques while changing dressing each day. °May shower starting three days after surgery. °Please use a clean towel to pat the incision dry following  showers. °Continue to use ice for pain and swelling after surgery. °Do not use any lotions or creams on the incision until instructed by your surgeon. ° °Take Xarelto for two and a half more weeks following discharge from the hospital, then discontinue Xarelto. °Once the patient has completed the Xarelto, they may resume the 81 mg Aspirin. ° ° ° °Information on   my medicine - XARELTO® (Rivaroxaban) ° °Why was Xarelto® prescribed for you? °Xarelto® was prescribed for you to reduce the risk of blood clots forming after orthopedic surgery. The medical term for these abnormal blood clots is venous thromboembolism (VTE). ° °What do you need to know about xarelto® ? °Take your Xarelto® ONCE DAILY at the same time every day. °You may take it either with or without food. ° °If you have difficulty swallowing the tablet whole, you may crush it and mix in applesauce just prior to taking your dose. ° °Take Xarelto® exactly as prescribed by your doctor and DO NOT stop taking Xarelto® without talking to the doctor who prescribed the medication.  Stopping without other VTE prevention medication to take the place of Xarelto® may increase your risk of developing a clot. ° °After discharge, you should have regular check-up appointments with your healthcare provider that is prescribing your Xarelto®.   ° °What do you do if you miss a dose? °If you miss a dose, take it as soon as you remember on the same day then continue your regularly scheduled once daily regimen the next day. Do not take two doses of Xarelto® on the same day.  ° °Important Safety Information °A possible side effect of Xarelto® is bleeding. You should call your healthcare provider right away if you experience any of the following: °? Bleeding from an injury or your nose that does not stop. °? Unusual colored urine (red or dark brown) or unusual colored stools (red or black). °? Unusual bruising for unknown reasons. °? A serious fall or if you hit your head (even if  there is no bleeding). ° °Some medicines may interact with Xarelto® and might increase your risk of bleeding while on Xarelto®. To help avoid this, consult your healthcare provider or pharmacist prior to using any new prescription or non-prescription medications, including herbals, vitamins, non-steroidal anti-inflammatory drugs (NSAIDs) and supplements. ° °This website has more information on Xarelto®: www.xarelto.com. ° ° ° °

## 2017-03-06 NOTE — Op Note (Signed)
OPERATIVE REPORT-TOTAL KNEE ARTHROPLASTY   Pre-operative diagnosis- Osteoarthritis  Right knee(s)  Post-operative diagnosis- Osteoarthritis Right knee(s)  Procedure-  Right  Total Knee Arthroplasty  Surgeon- Dione Plover. Lether Tesch, MD  Assistant- Youlanda Mighty, PA-C   Anesthesia-  Adductor canal block and spinal  EBL-* No blood loss amount entered *   Drains Hemovac  Tourniquet time-  Total Tourniquet Time Documented: Thigh (Right) - 45 minutes Total: Thigh (Right) - 45 minutes     Complications- None  Condition-PACU - hemodynamically stable.   Brief Clinical Note  [name] is a 76 y.o. 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 He has constant pain, with activity and at rest and significant functional deficits with difficulties even with ADLs. He has had extensive non-op management including analgesics, injections of cortisone and viscosupplements, and home exercise program, but remains in significant pain with significant dysfunction. Radiographs show bone on bone arthritis lateral and patellofemoral. He presents now for right Total Knee Arthroplasty.    Procedure in detail---   The patient is brought into the operating room and positioned supine on the operating table. After successful administration of  Adductor canal block and spinal,   a tourniquet is placed high on the  Right thigh(s) and the lower extremity is prepped and draped in the usual sterile fashion. Time out is performed by the operating team and then the  Right lower extremity is wrapped in Esmarch, knee flexed and the tourniquet inflated to 300 mmHg.       A midline incision is made with a ten blade through the subcutaneous tissue to the level of the extensor mechanism. A fresh blade is used to make a medial parapatellar arthrotomy. Soft tissue over the proximal medial tibia is subperiosteally elevated to the joint line with a knife and into the  semimembranosus bursa with a Cobb elevator. Soft tissue over the proximal lateral tibia is elevated with attention being paid to avoiding the patellar tendon on the tibial tubercle. The patella is everted, knee flexed 90 degrees and the ACL and PCL are removed. Findings are bone on bone lateral and patellofemoral with large global osteophytes.        The drill is used to create a starting hole in the distal femur and the canal is thoroughly irrigated with sterile saline to remove the fatty contents. The 5 degree Right  valgus alignment guide is placed into the femoral canal and the distal femoral cutting block is pinned to remove 10 mm off the distal femur. Resection is made with an oscillating saw.      The tibia is subluxed forward and the menisci are removed. The extramedullary alignment guide is placed referencing proximally at the medial aspect of the tibial tubercle and distally along the second metatarsal axis and tibial crest. The block is pinned to remove 50mm off the more deficient lateral  side. Resection is made with an oscillating saw. Size 5is the most appropriate size for the tibia and the proximal tibia is prepared with the modular drill and keel punch for that size.      The femoral sizing guide is placed and size 5 is most appropriate. Rotation is marked off the epicondylar axis and confirmed by creating a rectangular flexion gap at 90 degrees. The size 5 cutting block is pinned in this rotation and the anterior, posterior and chamfer cuts are made with the oscillating saw. The intercondylar block is then placed and  that cut is made.      Trial size 5 tibial component, trial size 5 posterior stabilized femur and a 10  mm posterior stabilized rotating platform insert trial is placed. Full extension is achieved with excellent varus/valgus and anterior/posterior balance throughout full range of motion. The patella is everted and thickness measured to be 25  mm. Free hand resection is taken to 15  mm, a 38 template is placed, lug holes are drilled, trial patella is placed, and it tracks normally. Osteophytes are removed off the posterior femur with the trial in place. All trials are removed and the cut bone surfaces prepared with pulsatile lavage. Cement is mixed and once ready for implantation, the size 5 tibial implant, size  5 posterior stabilized femoral component, and the size 38 patella are cemented in place and the patella is held with the clamp. The trial insert is placed and the knee held in full extension. The Exparel (20 ml mixed with 60 ml saline) is injected into the extensor mechanism, posterior capsule, medial and lateral gutters and subcutaneous tissues.  All extruded cement is removed and once the cement is hard the permanent 10 mm posterior stabilized rotating platform insert is placed into the tibial tray.      The wound is copiously irrigated with saline solution and the extensor mechanism closed over a hemovac drain with #1 V-loc suture. The tourniquet is released for a total tourniquet time of 43  minutes. Flexion against gravity is 135 degrees and the patella tracks normally. Subcutaneous tissue is closed with 2.0 vicryl and subcuticular with running 4.0 Monocryl. The incision is cleaned and dried and steri-strips and a bulky sterile dressing are applied. The limb is placed into a knee immobilizer and the patient is awakened and transported to recovery in stable condition.      Please note that a surgical assistant was a medical necessity for this procedure in order to perform it in a safe and expeditious manner. Surgical assistant was necessary to retract the ligaments and vital neurovascular structures to prevent injury to them and also necessary for proper positioning of the limb to allow for anatomic placement of the prosthesis.   Dione Plover Jaclene Bartelt, MD    03/06/2017, 2:49 PM

## 2017-03-06 NOTE — Interval H&P Note (Signed)
History and Physical Interval Note:  03/06/2017 12:24 PM  [name]  has presented today for surgery, with the diagnosis of Osteoarthritis Right Knee  The various methods of treatment have been discussed with the patient and family. After consideration of risks, benefits and other options for treatment, the patient has consented to  Procedure(s): RIGHT TOTAL KNEE ARTHROPLASTY (Right) as a surgical intervention .  The patient's history has been reviewed, patient examined, no change in status, stable for surgery.  I have reviewed the patient's chart and labs.  Questions were answered to the patient's satisfaction.     Gearlean Alf

## 2017-03-06 NOTE — Progress Notes (Signed)
Assisted Dr. Rose with right, ultrasound guided, adductor canal block. Side rails up, monitors on throughout procedure. See vital signs in flow sheet. Tolerated Procedure well.  

## 2017-03-06 NOTE — Anesthesia Procedure Notes (Signed)
Procedure Name: MAC Date/Time: 03/06/2017 1:26 PM Performed by: Claudia Desanctis Pre-anesthesia Checklist: Patient identified, Emergency Drugs available, Suction available and Patient being monitored Patient Re-evaluated:Patient Re-evaluated prior to induction Oxygen Delivery Method: Simple face mask Airway Equipment and Method: Oral airway Placement Confirmation: positive ETCO2 Dental Injury: Teeth and Oropharynx as per pre-operative assessment

## 2017-03-06 NOTE — Anesthesia Postprocedure Evaluation (Signed)
Anesthesia Post Note  Patient: [name]  Procedure(s) Performed: Procedure(s) (LRB): RIGHT TOTAL KNEE ARTHROPLASTY (Right)     Patient location during evaluation: PACU Anesthesia Type: Spinal Level of consciousness: oriented and awake and alert Pain management: pain level controlled Vital Signs Assessment: post-procedure vital signs reviewed and stable Respiratory status: spontaneous breathing, respiratory function stable and patient connected to nasal cannula oxygen Cardiovascular status: blood pressure returned to baseline and stable Postop Assessment: no headache and no backache Anesthetic complications: no    Last Vitals:  Vitals:   03/06/17 1600 03/06/17 1620  BP: 110/87 (!) 142/76  Pulse: 77 68  Resp: 13 18  Temp: (!) 36.3 C (!) 36.3 C    Last Pain:  Vitals:   03/06/17 1620  TempSrc: Oral  PainSc: 4                  Latysha Thackston S

## 2017-03-06 NOTE — Anesthesia Preprocedure Evaluation (Signed)
Anesthesia Evaluation  Patient identified by MRN, date of birth, ID band Patient awake    Reviewed: Allergy & Precautions, NPO status , Patient's Chart, lab work & pertinent test results  Airway Mallampati: II  TM Distance: >3 FB Neck ROM: Full    Dental no notable dental hx.    Pulmonary neg pulmonary ROS, former smoker,    Pulmonary exam normal breath sounds clear to auscultation       Cardiovascular hypertension, + CAD, + Past MI, + CABG and + Peripheral Vascular Disease  Normal cardiovascular exam Rhythm:Regular Rate:Normal     Neuro/Psych negative neurological ROS  negative psych ROS   GI/Hepatic negative GI ROS, Neg liver ROS,   Endo/Other  negative endocrine ROSdiabetes  Renal/GU negative Renal ROS  negative genitourinary   Musculoskeletal negative musculoskeletal ROS (+)   Abdominal   Peds negative pediatric ROS (+)  Hematology negative hematology ROS (+)   Anesthesia Other Findings   Reproductive/Obstetrics negative OB ROS                             Anesthesia Physical Anesthesia Plan  ASA: III  Anesthesia Plan: Spinal   Post-op Pain Management:    Induction: Intravenous  PONV Risk Score and Plan: 1 and Ondansetron, Dexamethasone and Propofol  Airway Management Planned: Simple Face Mask  Additional Equipment:   Intra-op Plan:   Post-operative Plan:   Informed Consent: I have reviewed the patients History and Physical, chart, labs and discussed the procedure including the risks, benefits and alternatives for the proposed anesthesia with the patient or authorized representative who has indicated his/her understanding and acceptance.   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

## 2017-03-06 NOTE — Transfer of Care (Signed)
Immediate Anesthesia Transfer of Care Note  Patient: [name]  Procedure(s) Performed: Procedure(s): RIGHT TOTAL KNEE ARTHROPLASTY (Right)  Patient Location: PACU  Anesthesia Type:Spinal  Level of Consciousness: awake, alert , oriented and patient cooperative  Airway & Oxygen Therapy: Patient Spontanous Breathing and Patient connected to face mask oxygen  Post-op Assessment: Report given to RN and Post -op Vital signs reviewed and stable  Post vital signs: stable  Last Vitals:  Vitals:   03/06/17 1250 03/06/17 1526  BP: 124/64 (!) 146/79  Pulse: 79 93  Resp: 20 18  Temp:  (!) 36.3 C    Last Pain:  Vitals:   03/06/17 1155  TempSrc:   PainSc: 4       Patients Stated Pain Goal: 4 (84/78/41 2820)  Complications: No apparent anesthesia complications

## 2017-03-06 NOTE — Anesthesia Procedure Notes (Signed)
Anesthesia Procedure Image    

## 2017-03-07 LAB — BASIC METABOLIC PANEL
Anion gap: 6 (ref 5–15)
BUN: 18 mg/dL (ref 6–20)
CO2: 25 mmol/L (ref 22–32)
Calcium: 8.3 mg/dL — ABNORMAL LOW (ref 8.9–10.3)
Chloride: 104 mmol/L (ref 101–111)
Creatinine, Ser: 1.15 mg/dL (ref 0.61–1.24)
GFR calc Af Amer: >60 mL/min (ref >60)
GFR calc non Af Amer: >60 mL/min (ref >60)
Glucose, Bld: 195 mg/dL — ABNORMAL HIGH (ref 65–99)
Potassium: 4.6 mmol/L (ref 3.5–5.1)
Sodium: 135 mmol/L (ref 135–145)

## 2017-03-07 LAB — CBC
HCT: 37.1 % — ABNORMAL LOW (ref 39.0–52.0)
Hemoglobin: 13.3 g/dL (ref 13.0–17.0)
MCH: 32.2 pg (ref 26.0–34.0)
MCHC: 35.8 g/dL (ref 30.0–36.0)
MCV: 89.8 fL (ref 78.0–100.0)
Platelets: 178 10*3/uL (ref 150–400)
RBC: 4.13 MIL/uL — ABNORMAL LOW (ref 4.22–5.81)
RDW: 12.1 % (ref 11.5–15.5)
WBC: 11 10*3/uL — ABNORMAL HIGH (ref 4.0–10.5)

## 2017-03-07 MED ORDER — OXYCODONE HCL 5 MG PO TABS: 5.0000 mg | ORAL_TABLET | ORAL | 0 refills | Status: DC | PRN

## 2017-03-07 MED ORDER — TRAMADOL HCL 50 MG PO TABS: 50.0000 mg | ORAL_TABLET | Freq: Four times a day (QID) | ORAL | 0 refills | Status: DC | PRN

## 2017-03-07 MED ORDER — RIVAROXABAN 10 MG PO TABS: 10.0000 mg | ORAL_TABLET | Freq: Every day | ORAL | 0 refills | Status: DC

## 2017-03-07 MED ORDER — METHOCARBAMOL 500 MG PO TABS: 500.0000 mg | ORAL_TABLET | Freq: Four times a day (QID) | ORAL | 0 refills | Status: DC | PRN

## 2017-03-07 NOTE — Evaluation (Signed)
Physical Therapy Evaluation Patient Details Name: [name] MRN: [id number] DOB: 05/05/1941 Today's Date: 03/07/2017   History of Present Illness  RIGHT TOTAL KNEE ARTHROPLASTY (Right)  Clinical Impression  The patient ambulated x 125'. Plans for HHPT per patient.  Pt admitted with above diagnosis. Pt currently with functional limitations due to the deficits listed below (see PT Problem List).  Pt will benefit from skilled PT to increase their independence and safety with mobility to allow discharge to the venue listed below.       Follow Up Recommendations Home health PT;DC plan and follow up therapy as arranged by surgeon    Equipment Recommendations  Rolling walker with 5" wheels    Recommendations for Other Services       Precautions / Restrictions Precautions Precautions: Knee Required Braces or Orthoses: Knee Immobilizer - Right Knee Immobilizer - Right: Discontinue once straight leg raise with < 10 degree lag      Mobility  Bed Mobility Overal bed mobility: Independent Bed Mobility: Supine to Sit;Sit to Supine     Supine to sit: Min guard Sit to supine: Min guard   General bed mobility comments: in recliner  Transfers Overall transfer level: Needs assistance Equipment used: Rolling walker (2 wheeled) Transfers: Sit to/from Stand Sit to Stand: Min guard Stand pivot transfers: Min assist       General transfer comment: VC for hand and  right leg placement  Ambulation/Gait Ambulation/Gait assistance: Min assist Ambulation Distance (Feet): 125 Feet Assistive device: Rolling walker (2 wheeled) Gait Pattern/deviations: Step-to pattern;Step-through pattern     General Gait Details: cues for posture and swing, sequence  Stairs            Wheelchair Mobility    Modified Rankin (Stroke Patients Only)       Balance                                             Pertinent Vitals/Pain Pain Assessment: 0-10 Pain Score: 5   Pain Location: right knee Pain Descriptors / Indicators: Aching;Tightness Pain Intervention(s): Premedicated before session;Repositioned;Ice applied;Monitored during session    Central Garage expects to be discharged to:: Private residence Living Arrangements: Spouse/significant other Available Help at Discharge: Family;Available 24 hours/day Type of Home: House Home Access: Stairs to enter Entrance Stairs-Rails: Right Entrance Stairs-Number of Steps: 5 Home Layout: One level Home Equipment: Grab bars - tub/shower;Hand held shower head;Cane - single point;Shower seat      Prior Function Level of Independence: Independent         Comments: small farm; retired Armed forces logistics/support/administrative officer        Extremity/Trunk Assessment   Upper Extremity Assessment Upper Extremity Assessment: Defer to OT evaluation    Lower Extremity Assessment Lower Extremity Assessment: RLE deficits/detail RLE Deficits / Details: able to perform SLR, knee flexion 10-50    Cervical / Trunk Assessment Cervical / Trunk Assessment: Normal  Communication   Communication: No difficulties  Cognition Arousal/Alertness: Awake/alert Behavior During Therapy: WFL for tasks assessed/performed Overall Cognitive Status: Within Functional Limits for tasks assessed                                        General Comments      Exercises Total Joint  Exercises Ankle Circles/Pumps: AROM;Both;10 reps Quad Sets: AROM;Both Towel Squeeze: AROM;Right;10 reps Short Arc Quad: AROM;Right Heel Slides: AROM;Right;10 reps Hip ABduction/ADduction: AROM;Right;10 reps Straight Leg Raises: AAROM;Right;10 reps   Assessment/Plan    PT Assessment Patient needs continued PT services  PT Problem List Decreased strength;Decreased range of motion;Decreased activity tolerance;Decreased mobility;Decreased knowledge of precautions;Decreased safety awareness;Decreased knowledge of use of  DME;Pain       PT Treatment Interventions DME instruction;Gait training;Stair training;Functional mobility training;Therapeutic activities;Therapeutic exercise;Patient/family education    PT Goals (Current goals can be found in the Care Plan section)  Acute Rehab PT Goals Patient Stated Goal: home tomorrow PT Goal Formulation: With patient Time For Goal Achievement: 03/10/17 Potential to Achieve Goals: Good    Frequency 7X/week   Barriers to discharge        Co-evaluation               AM-PAC PT "6 Clicks" Daily Activity  Outcome Measure Difficulty turning over in bed (including adjusting bedclothes, sheets and blankets)?: Total Difficulty moving from lying on back to sitting on the side of the bed? : Total Difficulty sitting down on and standing up from a chair with arms (e.g., wheelchair, bedside commode, etc,.)?: Total Help needed moving to and from a bed to chair (including a wheelchair)?: A Little Help needed walking in hospital room?: A Little Help needed climbing 3-5 steps with a railing? : A Lot 6 Click Score: 11    End of Session   Activity Tolerance: Patient tolerated treatment well Patient left: in chair;with call bell/phone within reach Nurse Communication: Mobility status PT Visit Diagnosis: Difficulty in walking, not elsewhere classified (R26.2);Pain Pain - Right/Left: Right Pain - part of body: Knee    Time: 1137-1200 PT Time Calculation (min) (ACUTE ONLY): 23 min   Charges:   PT Evaluation $PT Eval Low Complexity: 1 Procedure PT Treatments $Gait Training: 8-22 mins   PT G CodesTresa Endo PT [phone]   Claretha Cooper 03/07/2017, 12:51 PM

## 2017-03-07 NOTE — Evaluation (Signed)
Occupational Therapy Evaluation Patient Details Name: ZEBEDIAH BEEZLEY MRN: [id number] DOB: 28-Oct-1940 Today's Date: 03/07/2017    History of Present Illness RIGHT TOTAL KNEE ARTHROPLASTY (Right)   Clinical Impression   Pt is s/p TKA resulting in the deficits listed below (see OT Problem List).  Pt will benefit from skilled OT to increase their safety and independence with ADL and functional mobility for ADL to facilitate discharge to venue listed below.       Follow Up Recommendations  No OT follow up    Equipment Recommendations  3 in 1 bedside commode    Recommendations for Other Services       Precautions / Restrictions Precautions Precautions: None      Mobility Bed Mobility Overal bed mobility: Needs Assistance Bed Mobility: Supine to Sit;Sit to Supine     Supine to sit: Min guard Sit to supine: Min guard      Transfers Overall transfer level: Needs assistance Equipment used: Rolling walker (2 wheeled) Transfers: Sit to/from [facility] Sit to Stand: Min assist Stand pivot transfers: Min assist       General transfer comment: VC for hand placement        ADL either performed or assessed with clinical judgement   ADL Overall ADL's : Needs assistance/impaired     Grooming: Set up;Sitting   Upper Body Bathing: Set up;Sitting   Lower Body Bathing: Moderate assistance;Sit to/from stand;Cueing for safety;Cueing for sequencing   Upper Body Dressing : Set up;Sitting   Lower Body Dressing: Moderate assistance;Sit to/from stand   Toilet Transfer: Minimal assistance;RW;Ambulation;Cueing for sequencing;Cueing for safety   Toileting- Clothing Manipulation and Hygiene: Min guard;Sit to/from stand;Cueing for safety;Cueing for sequencing       Functional mobility during ADLs: Minimal assistance;Rolling walker       Vision Patient Visual Report: No change from baseline        Extremity/Trunk Assessment Upper Extremity  Assessment Upper Extremity Assessment: Overall WFL for tasks assessed           Communication Communication Communication: No difficulties   Cognition Arousal/Alertness: Awake/alert Behavior During Therapy: WFL for tasks assessed/performed Overall Cognitive Status: Within Functional Limits for tasks assessed                                     General Comments               Home Living Family/patient expects to be discharged to:: Private residence Living Arrangements: Spouse/significant other Available Help at Discharge: Family;Available 24 hours/day Type of Home: House Home Access: Stairs to enter   Entrance Stairs-Rails: Right Home Layout: One level         Bathroom Toilet: Handicapped height Bathroom Accessibility: Yes   Home Equipment: Grab bars - tub/shower;Hand held shower head;Cane - single point;Shower seat          Prior Functioning/Environment Level of Independence: Independent        Comments: small farm; retired Personnel officer Problem List: Decreased strength      OT Treatment/Interventions: Self-care/ADL training;Patient/family education    OT Goals(Current goals can be found in the care plan section) Acute Rehab OT Goals Patient Stated Goal: hometmorrow OT Goal Formulation: With patient Time For Goal Achievement: 03/14/17 Potential to Achieve Goals: Good  OT Frequency: Min 2X/week  AM-PAC PT "6 Clicks" Daily Activity     Outcome Measure Help from another person eating meals?: None Help from another person taking care of personal grooming?: A Little Help from another person toileting, which includes using toliet, bedpan, or urinal?: A Little Help from another person bathing (including washing, rinsing, drying)?: A Little Help from another person to put on and taking off regular upper body clothing?: None Help from another person to put on and taking off regular lower body clothing?: A Little 6  Click Score: 20   End of Session Nurse Communication: Mobility status  Activity Tolerance: Patient tolerated treatment well Patient left: in chair  OT Visit Diagnosis: Unsteadiness on feet (R26.81);Muscle weakness (generalized) (M62.81)                Time: 1050-1106 OT Time Calculation (min): 16 min Charges:  OT General Charges $OT Visit: 1 Procedure OT Evaluation $OT Eval Moderate Complexity: 1 Procedure G-Codes:     Kari Baars, [place]  Payton Mccallum D 03/07/2017, 12:39 PM

## 2017-03-07 NOTE — Care Management Note (Signed)
Case Management Note  Patient Details  Name: [name] MRN: [id number] Date of Birth: 09/08/1940  Subjective/Objective:75 y/o m admitted w/OA R knee. POD#1 R TKA. From home, has cane. PT cons-await recc.                    Action/Plan:d/c plan home.   Expected Discharge Date:                  Expected Discharge Plan:  OP Rehab  In-House Referral:     Discharge planning Services  CM Consult  Post Acute Care Choice:  Durable Medical Equipment (cane) Choice offered to:     DME Arranged:    DME Agency:     HH Arranged:    HH Agency:     Status of Service:  In process, will continue to follow  If discussed at Long Length of Stay Meetings, dates discussed:    Additional Comments:  Dessa Phi, RN 03/07/2017, 12:04 PM

## 2017-03-07 NOTE — Discharge Summary (Signed)
Physician Discharge Summary   Patient ID: [name] MRN: [id number] DOB/AGE: 1940-10-01 76 y.o.  Admit date: 03/06/2017 Discharge date: 03/08/2017  Primary Diagnosis:  Osteoarthritis  Right knee(s) Admission Diagnoses:  Past Medical History:  Diagnosis Date  . BENIGN PROSTATIC HYPERTROPHY, WITH URINARY OBSTRUCTION 09/06/2007  . 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 Diabetes mellitus without complication ([place])    diet controled per dr note  . 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 DVT, HX OF 1998  . GERD 04/30/2007  . Headache    hx migraines  . History of hiatal hernia   . History of shingles   . HYPERLIPIDEMIA 04/26/2007  . HYPERTENSION 04/30/2007  . INGUINAL HERNIA, RIGHT 05/26/2010  . LOC OSTEOARTHROS NOT SPEC PRIM/SEC LOWER LEG 11/05/2009  . Lumbar disc disease with radiculopathy   . Myocardial infarction ([place]) 1989  . OSTEOARTHRITIS 04/26/2007  . 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 Pneumonia 07/2014   [facility] hospitalization  . PSA, INCREASED 07/09/2008  . Spinal stenosis of lumbar region    Discharge Diagnoses:   Principal Problem:   OA (osteoarthritis) of knee  Estimated body mass index is 29.97 kg/m as calculated from the following:   Height as of this encounter: 6' (1.829 m).   Weight as of this encounter: 100.2 kg (221 lb).  Procedure:  Procedure(s) (LRB): RIGHT TOTAL KNEE ARTHROPLASTY (Right)   Consults: None  HPI: [name] is a 76 y.o. 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 He has constant pain, with activity and at rest and significant functional deficits with difficulties even with ADLs. He has had extensive non-op management including analgesics, injections of cortisone and viscosupplements, and home exercise program, but remains in significant pain with significant dysfunction.  Radiographs show bone on bone arthritis lateral and patellofemoral. He presents now for right Total Knee Arthroplasty.   Laboratory Data: Admission on 03/06/2017  Component Date Value Ref Range Status  . WBC 03/07/2017 11.0* 4.0 - 10.5 K/uL Final  . RBC 03/07/2017 4.13* 4.22 - 5.81 MIL/uL Final  . Hemoglobin 03/07/2017 13.3  13.0 - 17.0 g/dL Final  . HCT 03/07/2017 37.1* 39.0 - 52.0 % Final  . MCV 03/07/2017 89.8  78.0 - 100.0 fL Final  . MCH 03/07/2017 32.2  26.0 - 34.0 pg Final  . MCHC 03/07/2017 35.8  30.0 - 36.0 g/dL Final  . RDW 03/07/2017 12.1  11.5 - 15.5 % Final  . Platelets 03/07/2017 178  150 - 400 K/uL Final  . Sodium 03/07/2017 135  135 - 145 mmol/L Final  . Potassium 03/07/2017 4.6  3.5 - 5.1 mmol/L Final  . Chloride 03/07/2017 104  101 - 111 mmol/L Final  . CO2 03/07/2017 25  22 - 32 mmol/L Final  . Glucose, Bld 03/07/2017 195* 65 - 99 mg/dL Final  . BUN 03/07/2017 18  6 - 20 mg/dL Final  . Creatinine, Ser 03/07/2017 1.15  0.61 - 1.24 mg/dL Final  . Calcium 03/07/2017 8.3* 8.9 - 10.3 mg/dL Final  . GFR calc non Af Amer 03/07/2017 >60  >60 mL/min Final  . GFR calc Af Amer 03/07/2017 >60  >60 mL/min Final   Comment: (NOTE) The eGFR has been calculated using the CKD EPI equation. This calculation has not been validated in all clinical situations. eGFR's persistently <60 mL/min signify possible Chronic Kidney Disease.   Marland Kitchen  Anion gap 03/07/2017 6  5 - 15 Final  Hospital Outpatient Visit on 02/27/2017  Component Date Value Ref Range Status  . aPTT 02/27/2017 35  24 - 36 seconds Final  . WBC 02/27/2017 5.0  4.0 - 10.5 K/uL Final  . RBC 02/27/2017 4.42  4.22 - 5.81 MIL/uL Final  . Hemoglobin 02/27/2017 14.0  13.0 - 17.0 g/dL Final  . HCT 02/27/2017 40.7  39.0 - 52.0 % Final  . MCV 02/27/2017 92.1  78.0 - 100.0 fL Final  . MCH 02/27/2017 31.7  26.0 - 34.0 pg Final  . MCHC 02/27/2017 34.4  30.0 - 36.0 g/dL Final  . RDW 02/27/2017 12.3  11.5 - 15.5 % Final  . Platelets  02/27/2017 186  150 - 400 K/uL Final  . Sodium 02/27/2017 141  135 - 145 mmol/L Final  . Potassium 02/27/2017 4.3  3.5 - 5.1 mmol/L Final  . Chloride 02/27/2017 109  101 - 111 mmol/L Final  . CO2 02/27/2017 27  22 - 32 mmol/L Final  . Glucose, Bld 02/27/2017 168* 65 - 99 mg/dL Final  . BUN 02/27/2017 16  6 - 20 mg/dL Final  . Creatinine, Ser 02/27/2017 1.19  0.61 - 1.24 mg/dL Final  . Calcium 02/27/2017 8.6* 8.9 - 10.3 mg/dL Final  . Total Protein 02/27/2017 6.7  6.5 - 8.1 g/dL Final  . Albumin 02/27/2017 4.1  3.5 - 5.0 g/dL Final  . AST 02/27/2017 21  15 - 41 U/L Final  . ALT 02/27/2017 17  17 - 63 U/L Final  . Alkaline Phosphatase 02/27/2017 39  38 - 126 U/L Final  . Total Bilirubin 02/27/2017 0.6  0.3 - 1.2 mg/dL Final  . GFR calc non Af Amer 02/27/2017 58* >60 mL/min Final  . GFR calc Af Amer 02/27/2017 >60  >60 mL/min Final   Comment: (NOTE) The eGFR has been calculated using the CKD EPI equation. This calculation has not been validated in all clinical situations. eGFR's persistently <60 mL/min signify possible Chronic Kidney Disease.   . Anion gap 02/27/2017 5  5 - 15 Final  . Prothrombin Time 02/27/2017 13.1  11.4 - 15.2 seconds Final  . INR 02/27/2017 0.99   Final  . ABO/RH(D) 02/27/2017 A NEG   Final  . Antibody Screen 02/27/2017 NEG   Final  . Sample Expiration 02/27/2017 03/09/2017   Final  . Extend sample reason 02/27/2017 NO TRANSFUSIONS OR PREGNANCY IN THE PAST 3 MONTHS   Final  . MRSA, PCR 02/27/2017 NEGATIVE  NEGATIVE Final  . Staphylococcus aureus 02/27/2017 NEGATIVE  NEGATIVE Final   Comment:        The Xpert SA Assay (FDA approved for NASAL specimens in patients over 22 years of age), is one component of a comprehensive surveillance program.  Test performance has been validated by [facility] for patients greater than or equal to 68 year old. It is not intended to diagnose infection nor to guide or monitor treatment.   . Hgb A1c MFr Bld 02/27/2017 6.5*  4.8 - 5.6 % Final   Comment: (NOTE)         Pre-diabetes: 5.7 - 6.4         Diabetes: >6.4         Glycemic control for adults with diabetes: <7.0   . Mean Plasma Glucose 02/27/2017 140  mg/dL Final   Comment: (NOTE) Performed At: [facility] [place], [place] [id number] Lindon Romp MD EM:[id number]   . ABO/RH(D) 02/27/2017  A NEG   Final     X-Rays:Dg Foot Complete Right  Result Date: 02/13/2017 Please see detailed radiograph report in office note.   EKG: Orders placed or performed in visit on 11/01/16  . EKG 12-Lead     Hospital Course: [name] is a 76 y.o. who was admitted to [facility]. They were brought to the operating room on 03/06/2017 and underwent Procedure(s): RIGHT TOTAL KNEE ARTHROPLASTY.  Patient tolerated the procedure well and was later transferred to the recovery room and then to the orthopaedic floor for postoperative care.  They were given PO and IV analgesics for pain control following their surgery.  They were given 24 hours of postoperative antibiotics of  Anti-infectives    Start     Dose/Rate Route Frequency Ordered Stop   03/06/17 1930  ceFAZolin (ANCEF) IVPB 2g/100 mL premix     2 g 200 mL/hr over 30 Minutes Intravenous Every 6 hours 03/06/17 1627 03/07/17 0236   03/06/17 1140  ceFAZolin (ANCEF) 2-4 GM/100ML-% IVPB    Comments:  Mardelle Matte   : cabinet override      03/06/17 1140 03/06/17 1332   03/06/17 1119  ceFAZolin (ANCEF) IVPB 2g/100 mL premix     2 g 200 mL/hr over 30 Minutes Intravenous On call to O.R. 03/06/17 1119 03/06/17 1342     and started on DVT prophylaxis in the form of Xarelto.   PT and OT were ordered for total joint protocol.  Discharge planning consulted to help with postop disposition and equipment needs.  Patient had a good night on the evening of surgery.  They started to get up OOB with therapy on day one. Hemovac drain was pulled without difficulty.  Continued to work with  therapy into day two.  Dressing was changed on day two and the incision was healing well.  Patient was seen in rounds and was ready to go home on POD 2.  Diet - Cardiac diet and Diabetic diet Follow up - in 2 weeks Activity - WBAT Disposition - Home Condition Upon Discharge - Good D/C Meds - See DC Summary DVT Prophylaxis - Xarelto  Discharge Instructions    Call MD / Call 911    Complete by:  As directed    If you experience chest pain or shortness of breath, CALL 911 and be transported to the hospital emergency room.  If you develope a fever above 101 F, pus (white drainage) or increased drainage or redness at the wound, or calf pain, call your surgeon's office.   Change dressing    Complete by:  As directed    Change dressing daily with sterile 4 x 4 inch gauze dressing and apply TED hose. Do not submerge the incision under water.   Constipation Prevention    Complete by:  As directed    Drink plenty of fluids.  Prune juice may be helpful.  You may use a stool softener, such as Colace (over the counter) 100 mg twice a day.  Use MiraLax (over the counter) for constipation as needed.   Diet - low sodium heart healthy    Complete by:  As directed    Diet Carb Modified    Complete by:  As directed    Discharge instructions    Complete by:  As directed    Take Xarelto for two and a half more weeks, then discontinue Xarelto. Once the patient has completed the Xarelto, they may resume the 81 mg Aspirin.  Pick up stool softner and laxative for home use following surgery while on pain medications. Do not submerge incision under water. Please use good hand washing techniques while changing dressing each day. May shower starting three days after surgery. Please use a clean towel to pat the incision dry following showers. Continue to use ice for pain and swelling after surgery. Do not use any lotions or creams on the incision until instructed by your surgeon.  Wear both TED hose on both  legs during the day every day for three weeks, but may remove the TED hose at night at home.  Postoperative Constipation Protocol  Constipation - defined medically as fewer than three stools per week and severe constipation as less than one stool per week.  One of the most common issues patients have following surgery is constipation.  Even if you have a regular bowel pattern at home, your normal regimen is likely to be disrupted due to multiple reasons following surgery.  Combination of anesthesia, postoperative narcotics, change in appetite and fluid intake all can affect your bowels.  In order to avoid complications following surgery, here are some recommendations in order to help you during your recovery period.  Colace (docusate) - Pick up an over-the-counter form of Colace or another stool softener and take twice a day as long as you are requiring postoperative pain medications.  Take with a full glass of water daily.  If you experience loose stools or diarrhea, hold the colace until you stool forms back up.  If your symptoms do not get better within 1 week or if they get worse, check with your doctor.  Dulcolax (bisacodyl) - Pick up over-the-counter and take as directed by the product packaging as needed to assist with the movement of your bowels.  Take with a full glass of water.  Use this product as needed if not relieved by Colace only.   MiraLax (polyethylene glycol) - Pick up over-the-counter to have on hand.  MiraLax is a solution that will increase the amount of water in your bowels to assist with bowel movements.  Take as directed and can mix with a glass of water, juice, soda, coffee, or tea.  Take if you go more than two days without a movement. Do not use MiraLax more than once per day. Call your doctor if you are still constipated or irregular after using this medication for 7 days in a row.  If you continue to have problems with postoperative constipation, please contact the office  for further assistance and recommendations.  If you experience "the worst abdominal pain ever" or develop nausea or vomiting, please contact the office immediatly for further recommendations for treatment.   Do not put a pillow under the knee. Place it under the heel.    Complete by:  As directed    Do not sit on low chairs, stoools or toilet seats, as it may be difficult to get up from low surfaces    Complete by:  As directed    Driving restrictions    Complete by:  As directed    No driving until released by the physician.   Increase activity slowly as tolerated    Complete by:  As directed    Lifting restrictions    Complete by:  As directed    No lifting until released by the physician.   Patient may shower    Complete by:  As directed    You may shower without a dressing once there  is no drainage.  Do not wash over the wound.  If drainage remains, do not shower until drainage stops.   TED hose    Complete by:  As directed    Use stockings (TED hose) for 3 weeks on both leg(s).  You may remove them at night for sleeping.   Weight bearing as tolerated    Complete by:  As directed    Laterality:  right   Extremity:  Lower     Allergies as of 03/07/2017      Reactions   Morphine Nausea Only, Other (See Comments)   Irritability   Vioxx [rofecoxib] Other (See Comments)   Bleeding out      Medication List    STOP taking these medications   aspirin 81 MG tablet   cyclobenzaprine 10 MG tablet Commonly known as:  FLEXERIL   HYDROcodone-acetaminophen 5-325 MG tablet Commonly known as:  NORCO/VICODIN   vitamin B-12 500 MCG tablet Commonly known as:  CYANOCOBALAMIN     TAKE these medications   docusate sodium 100 MG capsule Commonly known as:  COLACE Take 1 capsule (100 mg total) by mouth 2 (two) times daily.   ezetimibe 10 MG tablet Commonly known as:  ZETIA TAKE ONE TABLET BY MOUTH EVERY DAY   finasteride 5 MG tablet Commonly known as:  PROSCAR Take 5 mg by mouth  daily.   isosorbide mononitrate 30 MG 24 hr tablet Commonly known as:  IMDUR Take 1 tablet (30 mg total) by mouth daily.   methocarbamol 500 MG tablet Commonly known as:  ROBAXIN Take 1 tablet (500 mg total) by mouth every 6 (six) hours as needed for muscle spasms.   nitroGLYCERIN 0.4 MG SL tablet Commonly known as:  NITROSTAT Place 0.4 mg under the tongue every 5 (five) minutes as needed for chest pain.   oxyCODONE 5 MG immediate release tablet Commonly known as:  Oxy IR/ROXICODONE Take 1-2 tablets (5-10 mg total) by mouth every 4 (four) hours as needed for moderate pain or severe pain.   pantoprazole 40 MG tablet Commonly known as:  PROTONIX Take 40 mg by mouth daily.   rivaroxaban 10 MG Tabs tablet Commonly known as:  XARELTO Take 1 tablet (10 mg total) by mouth daily with breakfast. Take Xarelto for two and a half more weeks following discharge from the hospital, then discontinue Xarelto. Once the patient has completed the Xarelto, they may resume the 81 mg Aspirin.   simvastatin 40 MG tablet Commonly known as:  ZOCOR TAKE ONE TABLET BY MOUTH EVERY DAY   tolterodine 4 MG 24 hr capsule Commonly known as:  DETROL LA Take 4 mg by mouth daily.   traMADol 50 MG tablet Commonly known as:  ULTRAM Take 1-2 tablets (50-100 mg total) by mouth every 6 (six) hours as needed for moderate pain.   triamcinolone ointment 0.5 % Commonly known as:  KENALOG Apply 1 application topically 2 (two) times daily.      Follow-up Information    Gaynelle Arabian, MD. Schedule an appointment as soon as possible for a visit on 03/21/2017.   Specialty:  Orthopedic Surgery Contact information: [address] [place] [address] [phone]           Signed: Arlee Muslim, PA-C Orthopaedic Surgery 03/07/2017, 8:50 PM

## 2017-03-07 NOTE — Progress Notes (Addendum)
Physical Therapy Treatment Patient Details Name: [name] MRN: [id number] DOB: 07/31/1941 Today's Date: 03/07/2017    History of Present Illness RIGHT TOTAL KNEE ARTHROPLASTY (Right)    PT Comments    POD # 1 pm session Pt OOB in recliner, reported he had just used the bathroom and was able to sit on commode with his knee bent.  Assisted with amb a greater distance then assisted back to bed for CPM.  Pt progressing well and pl;ans to D/C to home tomorrow.    Follow Up Recommendations  Home health PT;DC plan and follow up therapy as arranged by surgeon     Equipment Recommendations  Rolling walker with 5" wheels    Recommendations for Other Services       Precautions / Restrictions Precautions Precautions: Knee Precaution Comments: did not use KI due to ability to perfrom SLR Required Braces or Orthoses: Knee Immobilizer - Right Knee Immobilizer - Right: Discontinue once straight leg raise with < 10 degree lag Restrictions Weight Bearing Restrictions: No    Mobility  Bed Mobility Overal bed mobility: Needs Assistance Bed Mobility: Sit to Supine     Supine to sit: Min guard Sit to supine: Min guard   General bed mobility comments: able to self perform  Transfers Overall transfer level: Needs assistance Equipment used: Rolling walker (2 wheeled) Transfers: Sit to/from [facility] Sit to Stand: Min guard Stand pivot transfers: Min guard       General transfer comment: one VC safety with turns as pt is mild impulsive  Ambulation/Gait Ambulation/Gait assistance: Min guard Ambulation Distance (Feet): 140 Feet Assistive device: Rolling walker (2 wheeled) Gait Pattern/deviations: Step-to pattern;Step-through pattern Gait velocity: WFL   General Gait Details: < 25% cues for posture and swing, sequence   Stairs            Wheelchair Mobility    Modified Rankin (Stroke Patients Only)       Balance                                            Cognition Arousal/Alertness: Awake/alert Behavior During Therapy: WFL for tasks assessed/performed Overall Cognitive Status: Within Functional Limits for tasks assessed                                        Exercises   General Comments        Pertinent Vitals/Pain Pain Assessment: 0-10 Pain Score: 2  Pain Location: right knee Pain Descriptors / Indicators: Aching;Tightness;Operative site guarding Pain Intervention(s): Monitored during session;Repositioned;Ice applied    Home Living Family/patient expects to be discharged to:: Private residence Living Arrangements: Spouse/significant other Available Help at Discharge: Family;Available 24 hours/day Type of Home: House Home Access: Stairs to enter Entrance Stairs-Rails: Right Home Layout: One level Home Equipment: Grab bars - tub/shower;Hand held shower head;Cane - single point;Shower seat      Prior Function Level of Independence: Independent      Comments: small farm; retired Air[employer] pilot   PT Goals (current goals can now be found in the care plan section) Acute Rehab PT Goals Patient Stated Goal: home tomorrow PT Goal Formulation: With patient Time For Goal Achievement: 03/10/17 Potential to Achieve Goals: Good Progress towards PT goals: Progressing toward goals    Frequency  7X/week      PT Plan Current plan remains appropriate    Co-evaluation              AM-PAC PT "6 Clicks" Daily Activity  Outcome Measure  Difficulty turning over in bed (including adjusting bedclothes, sheets and blankets)?: A Lot Difficulty moving from lying on back to sitting on the side of the bed? : A Lot Difficulty sitting down on and standing up from a chair with arms (e.g., wheelchair, bedside commode, etc,.)?: A Lot Help needed moving to and from a bed to chair (including a wheelchair)?: A Lot Help needed walking in hospital room?: A Lot Help needed climbing 3-5  steps with a railing? : A Lot 6 Click Score: 12    End of Session Equipment Utilized During Treatment: Gait belt Activity Tolerance: Patient tolerated treatment well Patient left: in bed;with call bell/phone within reach Nurse Communication: Mobility status PT Visit Diagnosis: Hemiplegia and hemiparesis Pain - Right/Left: Right Pain - part of body: Knee     Time: 3383-2919 PT Time Calculation (min) (ACUTE ONLY): 15 min  Charges:  $Gait Training: 8-22 mins                    G Codes:       Rica Koyanagi  PTA [facility]  Acute  Rehab Pager      [phone]

## 2017-03-07 NOTE — Progress Notes (Signed)
   Subjective: 1 Day Post-Op Procedure(s) (LRB): RIGHT TOTAL KNEE ARTHROPLASTY (Right) Patient reports pain as mild.   Patient seen in rounds for Dr. Wynelle Link. Patient is well, but has had some minor complaints of pain in the knee, requiring pain medications We will start therapy today.  Plan is to go Home after hospital stay.  Objective: Vital signs in last 24 hours: Temp:  [97.3 F (36.3 C)-98.5 F (36.9 C)] 98.2 F (36.8 C) (07/24 0543) Pulse Rate:  [61-93] 79 (07/24 0820) Resp:  [11-20] 18 (07/24 0543) BP: (98-155)/(46-87) 124/67 (07/24 0820) SpO2:  [94 %-99 %] 97 % (07/24 0543) Weight:  [100.2 kg (221 lb)] 100.2 kg (221 lb) (07/23 1620)  Intake/Output from previous day:  Intake/Output Summary (Last 24 hours) at 03/07/17 0837 Last data filed at 03/07/17 0600  Gross per 24 hour  Intake           4508.5 ml  Output             3170 ml  Net           1338.5 ml    Intake/Output this shift: No intake/output data recorded.  Labs:  Recent Labs  03/07/17 0522  HGB 13.3    Recent Labs  03/07/17 0522  WBC 11.0*  RBC 4.13*  HCT 37.1*  PLT 178    Recent Labs  03/07/17 0522  NA 135  K 4.6  CL 104  CO2 25  BUN 18  CREATININE 1.15  GLUCOSE 195*  CALCIUM 8.3*   No results for input(s): LABPT, INR in the last 72 hours.  EXAM General - Patient is Alert, Appropriate and Oriented Extremity - Neurovascular intact Sensation intact distally Intact pulses distally Dorsiflexion/Plantar flexion intact Dressing - dressing C/D/I Motor Function - intact, moving foot and toes well on exam.  Hemovac pulled without difficulty.  Past Medical History:  Diagnosis Date  . BENIGN PROSTATIC HYPERTROPHY, WITH URINARY OBSTRUCTION 09/06/2007  . 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 Diabetes mellitus without complication ([place])    diet controled per dr note  . 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 DVT, HX OF 1998  . GERD 04/30/2007  . Headache    hx migraines  . History of hiatal hernia   . History of shingles   . HYPERLIPIDEMIA 04/26/2007  . HYPERTENSION 04/30/2007  . INGUINAL HERNIA, RIGHT 05/26/2010  . LOC OSTEOARTHROS NOT SPEC PRIM/SEC LOWER LEG 11/05/2009  . Lumbar disc disease with radiculopathy   . Myocardial infarction ([place]) 1989  . OSTEOARTHRITIS 04/26/2007  . 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 Pneumonia 07/2014   [facility] hospitalization  . PSA, INCREASED 07/09/2008  . Spinal stenosis of lumbar region     Assessment/Plan: 1 Day Post-Op Procedure(s) (LRB): RIGHT TOTAL KNEE ARTHROPLASTY (Right) Principal Problem:   OA (osteoarthritis) of knee  Estimated body mass index is 29.97 kg/m as calculated from the following:   Height as of this encounter: 6' (1.829 m).   Weight as of this encounter: 100.2 kg (221 lb). Advance diet Up with therapy Plan for discharge tomorrow Discharge home with home health  DVT Prophylaxis - Xarelto Weight-Bearing as tolerated to right leg D/C O2 and Pulse OX and try on Room Air  Arlee Muslim, PA-C Orthopaedic Surgery 03/07/2017, 8:37 AM

## 2017-03-08 ENCOUNTER — Encounter (HOSPITAL_COMMUNITY): Payer: Self-pay | Admitting: Orthopedic Surgery

## 2017-03-08 LAB — CBC
HCT: 34.5 % — ABNORMAL LOW (ref 39.0–52.0)
Hemoglobin: 12 g/dL — ABNORMAL LOW (ref 13.0–17.0)
MCH: 30.9 pg (ref 26.0–34.0)
MCHC: 34.8 g/dL (ref 30.0–36.0)
MCV: 88.9 fL (ref 78.0–100.0)
Platelets: 176 10*3/uL (ref 150–400)
RBC: 3.88 MIL/uL — ABNORMAL LOW (ref 4.22–5.81)
RDW: 12.4 % (ref 11.5–15.5)
WBC: 11.3 10*3/uL — ABNORMAL HIGH (ref 4.0–10.5)

## 2017-03-08 LAB — BASIC METABOLIC PANEL
Anion gap: 7 (ref 5–15)
BUN: 23 mg/dL — ABNORMAL HIGH (ref 6–20)
CO2: 28 mmol/L (ref 22–32)
Calcium: 8.8 mg/dL — ABNORMAL LOW (ref 8.9–10.3)
Chloride: 105 mmol/L (ref 101–111)
Creatinine, Ser: 1.07 mg/dL (ref 0.61–1.24)
GFR calc Af Amer: >60 mL/min (ref >60)
GFR calc non Af Amer: >60 mL/min (ref >60)
Glucose, Bld: 137 mg/dL — ABNORMAL HIGH (ref 65–99)
Potassium: 4.3 mmol/L (ref 3.5–5.1)
Sodium: 140 mmol/L (ref 135–145)

## 2017-03-08 NOTE — Progress Notes (Signed)
Occupational Therapy Treatment Patient Details Name: [name] MRN: [id number] DOB: [birth year] Today's Date: 03/08/2017    History of present illness RIGHT TOTAL KNEE ARTHROPLASTY (Right)   OT comments  OT education complete regarding ADL activity s/p TKR  Follow Up Recommendations  No OT follow up    Equipment Recommendations  3 in 1 bedside commode    Recommendations for Other Services      Precautions / Restrictions Precautions Precautions: Knee Precaution Comments: did not use KI due to ability to perfrom SLR Restrictions Weight Bearing Restrictions: No       Mobility Bed Mobility Overal bed mobility: Needs Assistance Bed Mobility: Sit to Supine     Supine to sit: Supervision Sit to supine: Supervision   General bed mobility comments: able to self perform  Transfers Overall transfer level: Needs assistance Equipment used: Rolling walker (2 wheeled) Transfers: Sit to/from [facility] Sit to Stand: Supervision Stand pivot transfers: Supervision       General transfer comment: one VC safety with turns as pt is mild impulsive        ADL either performed or assessed with clinical judgement   ADL Overall ADL's : Needs assistance/impaired                 Upper Body Dressing : Set up;Sitting   Lower Body Dressing: Sit to/from stand;Minimal assistance Lower Body Dressing Details (indicate cue type and reason): wife will A as needed. Pt also familiar with AE and how to use Toilet Transfer: RW;Ambulation;Cueing for sequencing;Cueing for safety;Supervision/safety   Toileting- Clothing Manipulation and Hygiene: Min guard;Cueing for safety;Cueing for sequencing;Supervision/safety   Tub/ Shower Transfer: Cueing for sequencing;Cueing for safety;Walk-in Lobbyist Details (indicate cue type and reason): verbalized safety of walk in shower  Functional mobility during ADLs: Supervision/safety;Rolling walker        Vision Patient Visual Report: No change from baseline     Perception     Praxis      Cognition Arousal/Alertness: Awake/alert Behavior During Therapy: WFL for tasks assessed/performed Overall Cognitive Status: Within Functional Limits for tasks assessed                                                General Comments      Pertinent Vitals/ Pain       Pain Score: 4  Pain Location: R thigh Pain Descriptors / Indicators: Sore Pain Intervention(s): Monitored during session;Heat applied;Ice applied;Repositioned;Limited activity within patient's tolerance  Home Living                                          Prior Functioning/Environment              Frequency  Min 2X/week        Progress Toward Goals  OT Goals(current goals can now be found in the care plan section)  Progress towards OT goals: Progressing toward goals     Plan      Co-evaluation                 AM-PAC PT "6 Clicks" Daily Activity     Outcome Measure   Help from another person eating meals?: None Help from another person taking care of personal grooming?: None  Help from another person toileting, which includes using toliet, bedpan, or urinal?: A Little Help from another person bathing (including washing, rinsing, drying)?: A Little Help from another person to put on and taking off regular upper body clothing?: None Help from another person to put on and taking off regular lower body clothing?: A Little 6 Click Score: 21    End of Session CPM Right Knee CPM Right Knee: Off  OT Visit Diagnosis: Unsteadiness on feet (R26.81);Muscle weakness (generalized) (M62.81)   Activity Tolerance Patient tolerated treatment well   Patient Left in chair   Nurse Communication Mobility status        Time: 1848-5927 OT Time Calculation (min): 22 min  Charges: OT General Charges $OT Visit: 1 Procedure OT Treatments $Self Care/Home Management : 8-22  mins  [place], [place]   [name] D 03/08/2017, 10:34 AM

## 2017-03-08 NOTE — Progress Notes (Signed)
   Subjective: 2 Days Post-Op Procedure(s) (LRB): RIGHT TOTAL KNEE ARTHROPLASTY (Right) Patient reports pain as mild.   Patient seen in rounds for Dr. Wynelle Link. Patient is well, and has had no acute complaints or problems Patient is ready to go home  Objective: Vital signs in last 24 hours: Temp:  [98.5 F (36.9 C)-98.9 F (37.2 C)] 98.5 F (36.9 C) (07/25 0500) Pulse Rate:  [85-113] 85 (07/25 0500) Resp:  [16-18] 16 (07/25 0500) BP: (140-168)/(62-77) 140/63 (07/25 0500) SpO2:  [93 %-95 %] 93 % (07/25 0500)  Intake/Output from previous day:  Intake/Output Summary (Last 24 hours) at 03/08/17 1235 Last data filed at 03/08/17 0816  Gross per 24 hour  Intake             1240 ml  Output             2425 ml  Net            -1185 ml    Intake/Output this shift: Total I/O In: 240 [P.O.:240] Out: 200 [Urine:200]  Labs:  Recent Labs  03/07/17 0522 03/08/17 0538  HGB 13.3 12.0*    Recent Labs  03/07/17 0522 03/08/17 0538  WBC 11.0* 11.3*  RBC 4.13* 3.88*  HCT 37.1* 34.5*  PLT 178 176    Recent Labs  03/07/17 0522 03/08/17 0538  NA 135 140  K 4.6 4.3  CL 104 105  CO2 25 28  BUN 18 23*  CREATININE 1.15 1.07  GLUCOSE 195* 137*  CALCIUM 8.3* 8.8*   No results for input(s): LABPT, INR in the last 72 hours.  EXAM: General - Patient is Alert and Appropriate Extremity - Neurovascular intact Sensation intact distally Intact pulses distally Incision - clean, dry Motor Function - intact, moving foot and toes well on exam.   Assessment/Plan: 2 Days Post-Op Procedure(s) (LRB): RIGHT TOTAL KNEE ARTHROPLASTY (Right) Procedure(s) (LRB): RIGHT TOTAL KNEE ARTHROPLASTY (Right) Past Medical History:  Diagnosis Date  . BENIGN PROSTATIC HYPERTROPHY, WITH URINARY OBSTRUCTION 09/06/2007  . 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 Diabetes mellitus without complication ([place])    diet controled per  dr note  . 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 DVT, HX OF 1998  . GERD 04/30/2007  . Headache    hx migraines  . History of hiatal hernia   . History of shingles   . HYPERLIPIDEMIA 04/26/2007  . HYPERTENSION 04/30/2007  . INGUINAL HERNIA, RIGHT 05/26/2010  . LOC OSTEOARTHROS NOT SPEC PRIM/SEC LOWER LEG 11/05/2009  . Lumbar disc disease with radiculopathy   . Myocardial infarction ([place]) 1989  . OSTEOARTHRITIS 04/26/2007  . 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 Pneumonia 07/2014   [facility] hospitalization  . PSA, INCREASED 07/09/2008  . Spinal stenosis of lumbar region    Principal Problem:   OA (osteoarthritis) of knee  Estimated body mass index is 29.97 kg/m as calculated from the following:   Height as of this encounter: 6' (1.829 m).   Weight as of this encounter: 100.2 kg (221 lb). Up with therapy Diet - Cardiac diet and Diabetic diet Follow up - in 2 weeks Activity - WBAT Disposition - Home Condition Upon Discharge - Good D/C Meds - See DC Summary DVT Prophylaxis - Xarelto  Arlee Muslim, PA-C Orthopaedic Surgery 03/08/2017, 12:35 PM

## 2017-03-08 NOTE — Care Management Note (Signed)
Case Management Note  Patient Details  Name: [name] MRN: [id number] Date of Birth: 09/21/1940  Subjective/Objective: PT recc HHPT, rw-[facility] @ home already following for HHPT, aware of d/c & HHPT order. [facility] dme rep Brad aware to deliver home rw to rm prior d/c. No further CM needs.                   Action/Plan:d/c home w/[facility]HC/dme.   Expected Discharge Date:  03/08/17               Expected Discharge Plan:  [place]  In-House Referral:     Discharge planning Services  CM Consult  Post Acute Care Choice:  Durable Medical Equipment (cane) Choice offered to:  Patient  DME Arranged:  Walker rolling DME Agency:  [place]:  PT [place] Agency:  [facility] (formerly [facility])  Status of Service:  Completed, signed off  If discussed at [employer] of Stay Meetings, dates discussed:    Additional Comments:  Dessa Phi, RN 03/08/2017, 10:25 AM

## 2017-03-08 NOTE — Progress Notes (Signed)
Physical Therapy Treatment Patient Details Name: [name] MRN: [id number] DOB: [birth year] Today's Date: 03/08/2017    History of Present Illness RIGHT TOTAL KNEE ARTHROPLASTY (Right)    PT Comments    POD # 2 Assisted with amb and practiced stairs with spouse present.  Performed TKR  TE's following HEP instructed on proper tech, freq as well as use of ICE.   Follow Up Recommendations  Home health PT;DC plan and follow up therapy as arranged by surgeon     Equipment Recommendations  Rolling walker with 5" wheels    Recommendations for Other Services       Precautions / Restrictions Precautions Precautions: Knee Precaution Comments: did not use KI due to ability to perfrom SLR Restrictions Weight Bearing Restrictions: No Other Position/Activity Restrictions: WBAT    Mobility  Bed Mobility Overal bed mobility: Needs Assistance Bed Mobility: Sit to Supine     Supine to sit: Supervision Sit to supine: Supervision   General bed mobility comments: OOB in recliner  Transfers Overall transfer level: Needs assistance Equipment used: Rolling walker (2 wheeled) Transfers: Sit to/from [facility] Sit to Stand: Supervision Stand pivot transfers: Supervision       General transfer comment: one VC safety with turns as pt is mild impulsive  Ambulation/Gait Ambulation/Gait assistance: Supervision Ambulation Distance (Feet): 155 Feet Assistive device: Rolling walker (2 wheeled) Gait Pattern/deviations: Step-to pattern;Step-through pattern Gait velocity: WFL   General Gait Details: < 25% cues for posture and swing, sequence   Stairs Stairs: Yes   Stair Management: One rail Right;Step to pattern;Forwards;With walker Number of Stairs: 4 General stair comments: with spouse present for "hands on" instruction  Wheelchair Mobility    Modified Rankin (Stroke Patients Only)       Balance                                            Cognition Arousal/Alertness: Awake/alert Behavior During Therapy: WFL for tasks assessed/performed Overall Cognitive Status: Within Functional Limits for tasks assessed                                        Exercises   Total Knee Replacement TE's 10 reps B LE ankle pumps 10 reps towel squeezes 10 reps knee presses 10 reps heel slides  10 reps SAQ's 10 reps SLR's 10 reps ABD Followed by ICE     General Comments        Pertinent Vitals/Pain Pain Assessment: 0-10 Pain Score: 4  Pain Location: R thigh Pain Descriptors / Indicators: Sore;Tender Pain Intervention(s): Monitored during session;Repositioned;Ice applied    Home Living                      Prior Function            PT Goals (current goals can now be found in the care plan section) Progress towards PT goals: Progressing toward goals    Frequency           PT Plan Current plan remains appropriate    Co-evaluation              AM-PAC PT "6 Clicks" Daily Activity  Outcome Measure  Difficulty turning over in bed (including adjusting bedclothes, sheets and blankets)?: A Lot Difficulty  moving from lying on back to sitting on the side of the bed? : A Lot Difficulty sitting down on and standing up from a chair with arms (e.g., wheelchair, bedside commode, etc,.)?: A Lot Help needed moving to and from a bed to chair (including a wheelchair)?: A Little Help needed walking in hospital room?: A Little Help needed climbing 3-5 steps with a railing? : A Little 6 Click Score: 15    End of Session Equipment Utilized During Treatment: Gait belt Activity Tolerance: Patient tolerated treatment well Patient left: in chair;with call bell/phone within reach;with family/visitor present Nurse Communication:  (pt ready for D/C to home) PT Visit Diagnosis: Unsteadiness on feet (R26.81)     Time: 2878-6767 PT Time Calculation (min) (ACUTE ONLY): 28 min  Charges:  $Gait  Training: 8-22 mins $Therapeutic Exercise: 8-22 mins                    G Codes:       {Anitria Andon  PTA [facility]  Acute  Rehab Pager      [phone]

## 2017-03-10 DIAGNOSIS — Z96643 Presence of artificial hip joint, bilateral: Secondary | ICD-10-CM | POA: Diagnosis not present

## 2017-03-10 DIAGNOSIS — Z96651 Presence of right artificial knee joint: Secondary | ICD-10-CM | POA: Diagnosis not present

## 2017-03-10 DIAGNOSIS — E119 Type 2 diabetes mellitus without complications: Secondary | ICD-10-CM | POA: Diagnosis not present

## 2017-03-10 DIAGNOSIS — Z7901 Long term (current) use of anticoagulants: Secondary | ICD-10-CM | POA: Diagnosis not present

## 2017-03-10 DIAGNOSIS — Z87891 Personal history of nicotine dependence: Secondary | ICD-10-CM | POA: Diagnosis not present

## 2017-03-10 DIAGNOSIS — M069 Rheumatoid arthritis, unspecified: Secondary | ICD-10-CM | POA: Diagnosis not present

## 2017-03-10 DIAGNOSIS — I10 Essential (primary) hypertension: Secondary | ICD-10-CM | POA: Diagnosis not present

## 2017-03-10 DIAGNOSIS — Z471 Aftercare following joint replacement surgery: Secondary | ICD-10-CM | POA: Diagnosis not present

## 2017-03-10 DIAGNOSIS — Z9181 History of falling: Secondary | ICD-10-CM | POA: Diagnosis not present

## 2017-03-10 DIAGNOSIS — I252 Old myocardial infarction: Secondary | ICD-10-CM | POA: Diagnosis not present

## 2017-03-10 DIAGNOSIS — I2510 Atherosclerotic heart disease of native coronary artery without angina pectoris: Secondary | ICD-10-CM | POA: Diagnosis not present

## 2017-03-10 DIAGNOSIS — M5136 Other intervertebral disc degeneration, lumbar region: Secondary | ICD-10-CM | POA: Diagnosis not present

## 2017-03-13 DIAGNOSIS — Z96651 Presence of right artificial knee joint: Secondary | ICD-10-CM | POA: Diagnosis not present

## 2017-03-13 DIAGNOSIS — Z96643 Presence of artificial hip joint, bilateral: Secondary | ICD-10-CM | POA: Diagnosis not present

## 2017-03-13 DIAGNOSIS — I10 Essential (primary) hypertension: Secondary | ICD-10-CM | POA: Diagnosis not present

## 2017-03-13 DIAGNOSIS — I252 Old myocardial infarction: Secondary | ICD-10-CM | POA: Diagnosis not present

## 2017-03-13 DIAGNOSIS — E119 Type 2 diabetes mellitus without complications: Secondary | ICD-10-CM | POA: Diagnosis not present

## 2017-03-13 DIAGNOSIS — Z7901 Long term (current) use of anticoagulants: Secondary | ICD-10-CM | POA: Diagnosis not present

## 2017-03-13 DIAGNOSIS — Z471 Aftercare following joint replacement surgery: Secondary | ICD-10-CM | POA: Diagnosis not present

## 2017-03-13 DIAGNOSIS — Z87891 Personal history of nicotine dependence: Secondary | ICD-10-CM | POA: Diagnosis not present

## 2017-03-13 DIAGNOSIS — M5136 Other intervertebral disc degeneration, lumbar region: Secondary | ICD-10-CM | POA: Diagnosis not present

## 2017-03-13 DIAGNOSIS — M069 Rheumatoid arthritis, unspecified: Secondary | ICD-10-CM | POA: Diagnosis not present

## 2017-03-13 DIAGNOSIS — Z9181 History of falling: Secondary | ICD-10-CM | POA: Diagnosis not present

## 2017-03-13 DIAGNOSIS — I2510 Atherosclerotic heart disease of native coronary artery without angina pectoris: Secondary | ICD-10-CM | POA: Diagnosis not present

## 2017-03-16 DIAGNOSIS — M25511 Pain in right shoulder: Secondary | ICD-10-CM | POA: Diagnosis not present

## 2017-03-16 DIAGNOSIS — M1711 Unilateral primary osteoarthritis, right knee: Secondary | ICD-10-CM | POA: Diagnosis not present

## 2017-03-20 DIAGNOSIS — Z87891 Personal history of nicotine dependence: Secondary | ICD-10-CM | POA: Diagnosis not present

## 2017-03-20 DIAGNOSIS — I2510 Atherosclerotic heart disease of native coronary artery without angina pectoris: Secondary | ICD-10-CM | POA: Diagnosis not present

## 2017-03-20 DIAGNOSIS — Z96651 Presence of right artificial knee joint: Secondary | ICD-10-CM | POA: Diagnosis not present

## 2017-03-20 DIAGNOSIS — Z7901 Long term (current) use of anticoagulants: Secondary | ICD-10-CM | POA: Diagnosis not present

## 2017-03-20 DIAGNOSIS — I10 Essential (primary) hypertension: Secondary | ICD-10-CM | POA: Diagnosis not present

## 2017-03-20 DIAGNOSIS — Z9181 History of falling: Secondary | ICD-10-CM | POA: Diagnosis not present

## 2017-03-20 DIAGNOSIS — Z471 Aftercare following joint replacement surgery: Secondary | ICD-10-CM | POA: Diagnosis not present

## 2017-03-20 DIAGNOSIS — M069 Rheumatoid arthritis, unspecified: Secondary | ICD-10-CM | POA: Diagnosis not present

## 2017-03-20 DIAGNOSIS — M5136 Other intervertebral disc degeneration, lumbar region: Secondary | ICD-10-CM | POA: Diagnosis not present

## 2017-03-20 DIAGNOSIS — Z96643 Presence of artificial hip joint, bilateral: Secondary | ICD-10-CM | POA: Diagnosis not present

## 2017-03-20 DIAGNOSIS — E119 Type 2 diabetes mellitus without complications: Secondary | ICD-10-CM | POA: Diagnosis not present

## 2017-03-20 DIAGNOSIS — I252 Old myocardial infarction: Secondary | ICD-10-CM | POA: Diagnosis not present

## 2017-03-27 ENCOUNTER — Ambulatory Visit: Payer: PPO | Attending: Orthopedic Surgery

## 2017-03-27 DIAGNOSIS — G8929 Other chronic pain: Secondary | ICD-10-CM | POA: Diagnosis not present

## 2017-03-27 DIAGNOSIS — R2681 Unsteadiness on feet: Secondary | ICD-10-CM | POA: Diagnosis not present

## 2017-03-27 DIAGNOSIS — M25561 Pain in right knee: Secondary | ICD-10-CM | POA: Diagnosis not present

## 2017-03-27 DIAGNOSIS — M6281 Muscle weakness (generalized): Secondary | ICD-10-CM

## 2017-03-28 NOTE — Therapy (Signed)
[facility] [address] [place], [address] Phone: [phone]   Fax:  [phone]  Physical Therapy Evaluation  Patient Details  Name: [name] MRN: [id number] Date of Birth: 1940/08/23 Referring Provider: Dr. Wynelle Link  Encounter Date: 03/27/2017      PT End of Session - 03/28/17 1154    Visit Number 1   Number of Visits 13   Date for PT Re-Evaluation 24-May-2017   Authorization Type g codes 1/10   PT Start Time 1643-11-27   PT Stop Time 1730   PT Time Calculation (min) 45 min   Equipment Utilized During Treatment Gait belt   Activity Tolerance Patient tolerated treatment well   Behavior During Therapy [facility] for tasks assessed/performed      Past Medical History:  Diagnosis Date  . BENIGN PROSTATIC HYPERTROPHY, WITH URINARY OBSTRUCTION 09/06/2007  . 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-24-08  . Diabetes mellitus without complication ([place])    diet controled per dr note  . 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 DVT, HX OF 1998  . GERD 04/30/2007  . Headache    hx migraines  . History of hiatal hernia   . History of shingles   . HYPERLIPIDEMIA 04/26/2007  . HYPERTENSION 04/30/2007  . INGUINAL HERNIA, RIGHT 05/26/2010  . LOC OSTEOARTHROS NOT SPEC PRIM/SEC LOWER LEG 11/05/2009  . Lumbar disc disease with radiculopathy   . Myocardial infarction ([place]) 1989  . OSTEOARTHRITIS 04/26/2007  . 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 Pneumonia 07/2014   [facility] hospitalization  . PSA, INCREASED 07/09/2008  . Spinal stenosis of lumbar region     Past Surgical History:  Procedure Laterality Date  . APPENDECTOMY     rupture  . CARDIAC CATHETERIZATION [name] 07/07/2015   Procedure: Left Heart Cath and Cors/Grafts Angiography;  Surgeon: [name], MD;  Location: [place] [facility];  Service: Cardiovascular;   Laterality: [name];  . CATARACT EXTRACTION, BILATERAL    . COLONOSCOPY  03/2010   HP polyp, diverticulosis, rpt 10 yrs ([name])  . CORONARY ARTERY BYPASS GRAFT  1990   3 vessel   . CYSTOSCOPY  12/23/10   Cope  . KNEE ARTHROSCOPY Right    x2  . LAMINOTOMY  1986   L5/S1 lumbar laminotomy for two ruptured discs/fusion  . LUMBAR LAMINECTOMY/DECOMPRESSION MICRODISCECTOMY [name] 07/13/2016   Procedure: LUMBAR TWO-THREE, LUMBAR THREE-FOUR, LUMBAR FOUR-FIVE LAMINECTOMY AND FORAMINOTOMY;  Surgeon: [name], MD;  Location: [facility];  Service: Neurosurgery;  Laterality: [name];  LAMINECTOMY AND FORAMINOTOMY L2-L3, L3-L4,L4-L5  . TOTAL HIP ARTHROPLASTY Bilateral Nov 26, 1997  . TOTAL KNEE ARTHROPLASTY Right 03/06/2017   Procedure: RIGHT TOTAL KNEE ARTHROPLASTY;  Surgeon: Gaynelle Arabian, MD;  [facility];  Service: Orthopedics;  Laterality: Right;    There were no vitals filed for this visit.       Subjective Assessment - 03/27/17 1643    Subjective R TKR 03/06/17   Pertinent History Pt underwent R TKR 03/06/17 by Dr. Wynelle Link. No reported major post-op complications. He has had HH PT following surgery. Pt was performing supine, seated, and standing exercises with therapist. Pt reports that he was able to 98 degrees of R knee flexion and is still lacking extension but is unable to recall how much he is lacking. He has a history of lumbar laminectomy and decompression surgery 06/2016 (lumbar stenosis) and he describes what sounds like ACDF  in May 2018 (cervical stenosis). History of R shoulder bursitis which started after surgery. Pt received and cortisone injection and pain has resolved almost entirely. Pt reports deconditioning since prior to back and neck surgeries. Other significant PMH includes CABG and bilateral hip THR (posterior approach)   Limitations Walking   Patient Stated Goals Pt would like to improve his strength and endurance for working outside   Currently in Pain? Yes   Pain Score 3   Worst:  10/10; Best: 0/10   Pain Location Knee   Pain Orientation Right   Pain Descriptors / Indicators Aching   Pain Type Surgical pain   Pain Onset 1 to 4 weeks ago   Pain Frequency Intermittent   Aggravating Factors  Nightime, extended walking, riding in the car   Pain Relieving Factors Ice on knee, heat on posterior thigh (bruises from surgical tourniquet), Tylenol, infrequent narcotic pain meds at night   Multiple Pain Sites No            [facility] PT Assessment - 03/27/17 1659      Assessment   Medical Diagnosis R TKR   Referring Provider Dr. Wynelle Link   Onset Date/Surgical Date 03/06/17   Hand Dominance Right   Next MD Visit Later in August 2018   Prior Therapy [facility] following discharge     Precautions   Precautions None     Restrictions   Weight Bearing Restrictions No     Balance Screen   Has the patient fallen in the past 6 months No     [place] residence   Living Arrangements Spouse/significant other   Available Help at Discharge Family   Type of [place] to enter   Entrance Stairs-Number of Steps [place] One level   [place] - 2 wheels;Cane - single point;Shower seat;Other (comment);Grab bars - tub/shower  Lift chair     Prior Function   Level of Independence Independent   Vocation Retired   [employer] Retired International aid/development worker, farming     Cognition   Overall Cognitive Status Within Functional Limits for tasks assessed     Observation/Other Assessments   Other Surveys  Other Surveys   Lower Extremity Functional Scale  14/80     Sensation   Additional Comments Intact to light touch around R knee. Bilateral foot numbness, worst R first and second toes     ROM / Strength   AROM / PROM / Strength Strength;AROM     AROM   Overall AROM Comments At least 30 degrees of hip IR/ER bilaterally, not pushed through resistance  due to posterior approach bilateral THA   AROM Assessment Site Knee   Right/Left Hip --   Right/Left Knee Right;Left   Right Knee Extension -15   Right Knee Flexion 98   Left Knee Extension 0   Left Knee Flexion 128     Strength   Strength Assessment Site Hip;Knee;Ankle   Right/Left Hip Right;Left   Right Hip Flexion 4+/5   Right Hip External Rotation  4/5   Right Hip Internal Rotation 4/5   Right Hip ABduction 4-/5   Right Hip ADduction 4-/5   Left Hip Flexion 4+/5   Left Hip Extension --  Pt unable to lay prone to test   Left Hip External Rotation 4+/5   Left Hip Internal Rotation 4+/5   Left Hip  ABduction 4/5   Left Hip ADduction 4-/5   Right/Left Knee Right;Left   Right Knee Flexion 5/5   Right Knee Extension 5/5   Left Knee Flexion 5/5   Left Knee Extension 5/5   Right/Left Ankle Right;Left   Right Ankle Dorsiflexion 5/5   Left Ankle Dorsiflexion 5/5     Palpation   Patella mobility Hypomobile in superior/inferior directions. Normal medial/lateral patellar mobility   Palpation comment No pain reported with light palpation around R knee     Transfers   Comments Able to perform sit to stand transfer without UE support but with increased effort. Difficulty flexing R knee and decreased weight shift to RLE     Ambulation/Gait   Gait Comments Antalgic gait with spc in LUE for ambulation. Decreased step length LLE and decreased stance time on RLE. Decreased overall gait speed but functional for full household mobility     Standardized Balance Assessment   Standardized Balance Assessment Five Times Sit to Stand;10 meter walk test;Timed Up and Go Test   Five times sit to stand comments  16.7 seconds   10 Meter Walk 11.4s = 0.88 m/s     Timed Up and Go Test   TUG Normal TUG   Normal TUG (seconds) 9.3            Objective measurements completed on examination: See above findings.    Pt issued HEP which includes sit to stand, standing marches, standing mini  squats, and standing heel raises. Reinforced the importance of R knee stretches, especially for extension.               PT Education - 03/28/17 1154    Education provided Yes   Education Details HEP and plan of care   Person(s) Educated Patient   Methods Explanation;Handout   Comprehension Verbalized understanding          PT Short Term Goals - 03/28/17 1354      PT SHORT TERM GOAL #1   Title Pt will be independent with HEP in order to improve strength and balance in order to decrease fall risk and improve function at home and work.    Time 2   Period Weeks   Status New           PT Long Term Goals - 03/28/17 1354      PT LONG TERM GOAL #1   Title Pt will increase LEFS by at least 9 points in order to demonstrate significant improvement in lower extremity function.    Baseline 03/27/17: 14/80   Time 6   Period Weeks   Status New   Target Date 05/09/17     PT LONG TERM GOAL #2   Title Pt will improve R hip abduction/adduction strength by at least 1/2 MMT grade in order to improve R knee stability and ability to ambulate on uneven surfaces   Baseline 03/27/17: R hip abduction/adduction both 4-/5   Time 6   Period Weeks   Status New     PT LONG TERM GOAL #3   Title Pt will report worst R knee pain as no more than 7/10 in order to improve sleep and daily function with less pain   Baseline 03/27/17: worst 10/10   Time 6   Period Weeks     PT LONG TERM GOAL #4   Title Pt will decrease 5TSTS by at least 3 seconds in order to demonstrate clinically significant improvement in LE strength    Baseline 03/27/17:  16.7 seconds   Time 6   Period Weeks   Status New     PT LONG TERM GOAL #5   Title Pt will improve R knee AAROM to -5 to 110 degrees in order to normalize gait pattern and improve ability to ascend/descend stairs   Baseline 03/27/17: -15 to 98 degrees AAROM   Time 6   Period Weeks   Status New                Plan - 03/28/17 1156    Clinical  Impression Statement Pt is a pleasant 76 yo male referred for PT s/p R TKR 03/06/17. Pt presents today with deficits in bilateral hip strength, functional R knee strength, R knee pain, and gait impairments. LEFS is 14/80 indicating severe limitation in lower extremity function. 63m gait speed is 0.88 m/s which is below necessary speed for full community ambulation and Five Time Sit to Stand test is 16.7 seconds which is above cut-off for increased fall risk and decreased LE strength/power. R knee AAROM with overpressure is limited at -15 to 98 degrees. Pt will benefit from skilled PT services to address above-listed deficits in order to improve function at home and decrease pain.    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, difficulty with community ambulation/church participation   Clinical Presentation Evolving   Clinical Presentation due to: pain fluctuates daily, worse at night   Clinical Decision Making Moderate   Rehab Potential Good   PT Frequency 2x / week   PT Duration 6 weeks   PT Treatment/Interventions ADLs/Self Care Home Management;Aquatic Therapy;Cryotherapy;Electrical Stimulation;Iontophoresis 4mg /ml Dexamethasone;Moist Heat;Ultrasound;DME Instruction;Gait training;Stair training;Functional mobility training;Therapeutic activities;Therapeutic exercise;Balance training;Neuromuscular re-education;Patient/family education;Manual techniques;Dry needling   PT Next Visit Plan Educate pt about proper stretching techniques for extension/flexion, consolidate HEP   PT Home Exercise Plan Standing heel raises, standing marches, standing squats, sit to stand (see instruction section)   Consulted and Agree with Plan of Care Patient      Patient will benefit from skilled therapeutic intervention in order to improve the following deficits and impairments:  Abnormal gait, Decreased  balance, Difficulty walking, Decreased range of motion, Pain, Decreased strength  Visit Diagnosis: Muscle weakness (generalized) - Plan: PT plan of care cert/re-cert  Chronic pain of right knee - Plan: PT plan of care cert/re-cert  Unsteadiness on feet - Plan: PT plan of care cert/re-cert      G-Codes - 59/93/57 1407    Functional Assessment Tool Used (Outpatient Only) TUG, 5TSTS, 18m gait speed, LEFS, clinical judgement, ROM, MMT   Functional Limitation Mobility: Walking and moving around   Mobility: Walking and Moving Around Current Status (S1779) At least 40 percent but less than 60 percent impaired, limited or restricted   Mobility: Walking and Moving Around Goal Status (T9030) At least 1 percent but less than 20 percent impaired, limited or restricted       Problem List Patient Active Problem List   Diagnosis Date Noted  . Rash 03/01/2017  . Health maintenance examination 12/06/2016  . 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 Obesity, Class I, BMI 30-34.9 07/04/2016  . Low vitamin B12 level 01/01/2016  . Medicare annual wellness visit, subsequent 07/03/2015  . Advanced care planning/counseling discussion 07/03/2015  . Diet-controlled diabetes mellitus ([place]) 05/04/2015  . Angina pectoris ([place]) 05/04/2015  . Ex-smoker 05/04/2015  . Recurrent sinusitis 08/28/2014  . Benign localized hyperplasia of prostate  with urinary obstruction and other lower urinary tract symptoms (LUTS)(600.21) 04/02/2013  . 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 Spermatocele 04/02/2013  . Syncopal vertigo 11/04/2010  . Lumbar disc disease with radiculopathy 11/04/2010  . INGUINAL HERNIA, RIGHT 05/26/2010  . 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 PVD (peripheral vascular disease) with claudication ([place]) 08/11/2009  . 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 Carotid stenosis 07/09/2008  . Elevated prostate specific antigen (PSA) 07/09/2008  . Chronic prostatitis  05/09/2008  . 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 Essential hypertension 04/30/2007  . GERD 04/30/2007  . Hyperlipidemia 04/26/2007  . OA (osteoarthritis) of knee 04/26/2007  . DVT, HX OF 04/26/2007      Phillips Grout PT, DPT   Iyania Denne 03/28/2017, 2:10 PM  [place] [address], [place], [address] Phone: [phone]   Fax:  [phone]  Name: [name] MRN: [id number] Date of Birth: 1940-09-17

## 2017-03-29 ENCOUNTER — Ambulatory Visit: Payer: PPO

## 2017-03-29 DIAGNOSIS — M25561 Pain in right knee: Principal | ICD-10-CM

## 2017-03-29 DIAGNOSIS — R2681 Unsteadiness on feet: Secondary | ICD-10-CM

## 2017-03-29 DIAGNOSIS — M6281 Muscle weakness (generalized): Secondary | ICD-10-CM | POA: Diagnosis not present

## 2017-03-29 DIAGNOSIS — G8929 Other chronic pain: Secondary | ICD-10-CM

## 2017-03-29 NOTE — Therapy (Signed)
[place] [facility] [facility] [address], [place], [address] Phone: [phone]   Fax:  [phone]  Physical Therapy Treatment  Patient Details  Name: [name] MRN: [id number] Date of Birth: June 23, 1941 Referring Provider: Dr. Wynelle Link  Encounter Date: 03/29/2017      PT End of Session - 03/29/17 1330    Visit Number 2   Number of Visits 13   Date for PT Re-Evaluation 05/21/2017   Authorization Type g codes 06-Oct-2022   PT Start Time 1300   PT Stop Time 1345   PT Time Calculation (min) 45 min   Equipment Utilized During Treatment Gait belt   Activity Tolerance Patient tolerated treatment well   Behavior During Therapy [facility] for tasks assessed/performed      Past Medical History:  Diagnosis Date  . BENIGN PROSTATIC HYPERTROPHY, WITH URINARY OBSTRUCTION 09/06/2007  . 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 Chronic prostatitis 2008-05-21  . Diabetes mellitus without complication ([place])    diet controled per dr note  . 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 DVT, HX OF 1998  . GERD 04/30/2007  . Headache    hx migraines  . History of hiatal hernia   . History of shingles   . HYPERLIPIDEMIA 04/26/2007  . HYPERTENSION 04/30/2007  . INGUINAL HERNIA, RIGHT 05/26/2010  . LOC OSTEOARTHROS NOT SPEC PRIM/SEC LOWER LEG 11/05/2009  . Lumbar disc disease with radiculopathy   . Myocardial infarction ([place]) 1989  . OSTEOARTHRITIS 04/26/2007  . 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 Pneumonia 07/2014   [facility] hospitalization  . PSA, INCREASED 07/09/2008  . Spinal stenosis of lumbar region     Past Surgical History:  Procedure Laterality Date  . APPENDECTOMY     rupture  . CARDIAC CATHETERIZATION [name] 07/07/2015   Procedure: Left Heart Cath and Cors/Grafts Angiography;  Surgeon: [name], MD;  Location: [facility];  Service: Cardiovascular;   Laterality: [name];  . CATARACT EXTRACTION, BILATERAL    . COLONOSCOPY  03/2010   HP polyp, diverticulosis, rpt 10 yrs ([name])  . CORONARY ARTERY BYPASS GRAFT  1990   3 vessel   . CYSTOSCOPY  12/23/10   Cope  . KNEE ARTHROSCOPY Right    x2  . LAMINOTOMY  1986   L5/S1 lumbar laminotomy for two ruptured discs/fusion  . LUMBAR LAMINECTOMY/DECOMPRESSION MICRODISCECTOMY [name] 07/13/2016   Procedure: LUMBAR TWO-THREE, LUMBAR THREE-FOUR, LUMBAR FOUR-FIVE LAMINECTOMY AND FORAMINOTOMY;  Surgeon: [name], MD;  Location: [facility];  Service: Neurosurgery;  Laterality: [name];  LAMINECTOMY AND FORAMINOTOMY L2-L3, L3-L4,L4-L5  . TOTAL HIP ARTHROPLASTY Bilateral 1999,2000  . TOTAL KNEE ARTHROPLASTY Right 03/06/2017   Procedure: RIGHT TOTAL KNEE ARTHROPLASTY;  Surgeon: Gaynelle Arabian, MD;  [facility];  Service: Orthopedics;  Laterality: Right;    There were no vitals filed for this visit.      Subjective Assessment - 03/29/17 1306    Subjective Pt reports increased soreness in his knee after the previous session. Patient reports he was able to sleep in the bed for the first time last night.    Pertinent History Pt underwent R TKR 03/06/17 by Dr. Wynelle Link. No reported major post-op complications. He has had HH PT following surgery. Pt was performing supine, seated, and standing exercises with therapist. Pt reports that he was able to 98 degrees of R knee flexion and is still lacking extension but is unable to recall how  much he is lacking. He has a history of lumbar laminectomy and decompression surgery 06/2016 (lumbar stenosis) and he describes what sounds like ACDF in May 2018 (cervical stenosis). History of R shoulder bursitis which started after surgery. Pt received and cortisone injection and pain has resolved almost entirely. Pt reports deconditioning since prior to back and neck surgeries. Other significant PMH includes CABG and bilateral hip THR (posterior approach)   Limitations Walking   Patient  Stated Goals Pt would like to improve his strength and endurance for working outside   Currently in Pain? No/denies   Pain Onset 1 to 4 weeks ago      TREATMENT [name] Therapeutic Exercise Step ups - 2 x 15 on the R LE Heel raises in standing - x20 with weight shifting on the R LE SLR - 2 x 15 Knee extension/flexion with isometric and PT assist - x10 with 5 sec hold Pre Gait weight shifts forward/backward - 2 x 10  Manual therapy: STM to the hamstring, adductors and quadriceps to decrease increased pain and spasms in the R LE to allow for improvement in walking. Patellar mobilizations - 2 x 30 sec each direction. Patient demonstrates no increase in pain throughout entirety of session.       PT Education - 03/29/17 1330    Education provided Yes   Education Details HEP: step ups   Person(s) Educated Patient   Methods Explanation;Demonstration   Comprehension Verbalized understanding;Returned demonstration          PT Short Term Goals - 03/28/17 1354      PT SHORT TERM GOAL #1   Title Pt will be independent with HEP in order to improve strength and balance in order to decrease fall risk and improve function at home and work.    Time 2   Period Weeks   Status New           PT Long Term Goals - 03/28/17 1354      PT LONG TERM GOAL #1   Title Pt will increase LEFS by at least 9 points in order to demonstrate significant improvement in lower extremity function.    Baseline 03/27/17: 14/80   Time 6   Period Weeks   Status New   Target Date 05/09/17     PT LONG TERM GOAL #2   Title Pt will improve R hip abduction/adduction strength by at least 1/2 MMT grade in order to improve R knee stability and ability to ambulate on uneven surfaces   Baseline 03/27/17: R hip abduction/adduction both 4-/5   Time 6   Period Weeks   Status New     PT LONG TERM GOAL #3   Title Pt will report worst R knee pain as no more than 7/10 in order to improve sleep and daily function with less  pain   Baseline 03/27/17: worst 10/10   Time 6   Period Weeks     PT LONG TERM GOAL #4   Title Pt will decrease 5TSTS by at least 3 seconds in order to demonstrate clinically significant improvement in LE strength    Baseline 03/27/17: 16.7 seconds   Time 6   Period Weeks   Status New     PT LONG TERM GOAL #5   Title Pt will improve R knee AAROM to -5 to 110 degrees in order to normalize gait pattern and improve ability to ascend/descend stairs   Baseline 03/27/17: -15 to 98 degrees AAROM   Time 6  Period Weeks   Status New               Plan - 03/29/17 1334    Clinical Impression Statement Patient demonstrates decreased knee extension throughout entirity of session indicating hamstring and joint tightness. Patient demonstrates decreased weight shifting onto affected LE throughout session requiring Verbal cueing to correct. Patient will benefit from further skilled therapy to return to prior level of function.    Rehab Potential Good   PT Frequency 2x / week   PT Duration 6 weeks   PT Treatment/Interventions ADLs/Self Care Home Management;Aquatic Therapy;Cryotherapy;Electrical Stimulation;Iontophoresis 4mg /ml Dexamethasone;Moist Heat;Ultrasound;DME Instruction;Gait training;Stair training;Functional mobility training;Therapeutic activities;Therapeutic exercise;Balance training;Neuromuscular re-education;Patient/family education;Manual techniques;Dry needling   PT Next Visit Plan Educate pt about proper stretching techniques for extension/flexion, consolidate HEP   PT Home Exercise Plan Standing heel raises, standing marches, standing squats, sit to stand (see instruction section)   Consulted and Agree with Plan of Care Patient      Patient will benefit from skilled therapeutic intervention in order to improve the following deficits and impairments:  Abnormal gait, Decreased balance, Difficulty walking, Decreased range of motion, Pain, Decreased strength  Visit  Diagnosis: Chronic pain of right knee  Muscle weakness (generalized)  Unsteadiness on feet       G-Codes - 29-Mar-2017 1407    Functional Assessment Tool Used (Outpatient Only) TUG, 5TSTS, 31m gait speed, LEFS, clinical judgement, ROM, MMT   Functional Limitation Mobility: Walking and moving around   Mobility: Walking and Moving Around Current Status (L8921) At least 40 percent but less than 60 percent impaired, limited or restricted   Mobility: Walking and Moving Around Goal Status [phone]) At least 1 percent but less than 20 percent impaired, limited or restricted      Problem List Patient Active Problem List   Diagnosis Date Noted  . Rash 03/01/2017  . Health maintenance examination 12/06/2016  . 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 Obesity, Class I, BMI 30-34.9 07/04/2016  . Low vitamin B12 level 01/01/2016  . Medicare annual wellness visit, subsequent 07/03/2015  . Advanced care planning/counseling discussion 07/03/2015  . Diet-controlled diabetes mellitus ([place]) 05/04/2015  . Angina pectoris ([place]) 05/04/2015  . Ex-smoker 05/04/2015  . Recurrent sinusitis 08/28/2014  . Benign localized hyperplasia of prostate with urinary obstruction and other lower urinary tract symptoms (LUTS)(600.21) 04/02/2013  . 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 Spermatocele 04/02/2013  . Syncopal vertigo 11/04/2010  . Lumbar disc disease with radiculopathy 11/04/2010  . INGUINAL HERNIA, RIGHT 05/26/2010  . 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 PVD (peripheral vascular disease) with claudication ([place]) 08/11/2009  . 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 Carotid stenosis 07/09/2008  . Elevated prostate specific antigen (PSA) 07/09/2008  . 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 Essential hypertension 04/30/2007  . GERD 04/30/2007  . Hyperlipidemia 04/26/2007  . OA (osteoarthritis) of knee 04/26/2007   . DVT, HX OF 04/26/2007    Blythe Stanford, PT DPT 03/29/2017, 1:47 PM  [facility] [address], [place], [address] Phone: [phone]   Fax:  [phone]  Name: [name] MRN: [id number] Date of Birth: 12-18-1940

## 2017-04-03 ENCOUNTER — Ambulatory Visit: Payer: PPO

## 2017-04-03 DIAGNOSIS — M6281 Muscle weakness (generalized): Secondary | ICD-10-CM | POA: Diagnosis not present

## 2017-04-03 DIAGNOSIS — R2681 Unsteadiness on feet: Secondary | ICD-10-CM

## 2017-04-03 DIAGNOSIS — G8929 Other chronic pain: Secondary | ICD-10-CM

## 2017-04-03 DIAGNOSIS — M25561 Pain in right knee: Principal | ICD-10-CM

## 2017-04-03 NOTE — Therapy (Signed)
[facility] [address] [place], [address] Phone: [phone]   Fax:  [phone]  Physical Therapy Treatment  Patient Details  Name: [name] MRN: [id number] Date of Birth: [birth year] Referring Provider: Dr. Wynelle Link  Encounter Date: 04/03/2017      PT End of Session - 04/03/17 1030    Visit Number 3   Number of Visits 13   Date for PT Re-Evaluation 06/04/2017   Authorization Type g codes Nov 18, 2022   PT Start Time 0945   PT Stop Time 1030   PT Time Calculation (min) 45 min   Equipment Utilized During Treatment Gait belt   Activity Tolerance Patient tolerated treatment well   Behavior During Therapy [facility] for tasks assessed/performed      Past Medical History:  Diagnosis Date  . BENIGN PROSTATIC HYPERTROPHY, WITH URINARY OBSTRUCTION 09/06/2007  . 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 Chronic prostatitis Jun 04, 2008  . Diabetes mellitus without complication ([place])    diet controled per dr note  . 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 DVT, HX OF 1998  . GERD 04/30/2007  . Headache    hx migraines  . History of hiatal hernia   . History of shingles   . HYPERLIPIDEMIA 04/26/2007  . HYPERTENSION 04/30/2007  . INGUINAL HERNIA, RIGHT 05/26/2010  . LOC OSTEOARTHROS NOT SPEC PRIM/SEC LOWER LEG 11/05/2009  . Lumbar disc disease with radiculopathy   . Myocardial infarction ([place]) 1989  . OSTEOARTHRITIS 04/26/2007  . 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 Pneumonia 07/2014   [facility] hospitalization  . PSA, INCREASED 07/09/2008  . Spinal stenosis of lumbar region     Past Surgical History:  Procedure Laterality Date  . APPENDECTOMY     rupture  . CARDIAC CATHETERIZATION [name] 07/07/2015   Procedure: Left Heart Cath and Cors/Grafts Angiography;  Surgeon: [name], MD;  Location: [facility];  Service: Cardiovascular;   Laterality: [name];  . CATARACT EXTRACTION, BILATERAL    . COLONOSCOPY  03/2010   HP polyp, diverticulosis, rpt 10 yrs ([name])  . CORONARY ARTERY BYPASS GRAFT  1990   3 vessel   . CYSTOSCOPY  12/23/10   [facility]  . KNEE ARTHROSCOPY Right    x2  . LAMINOTOMY  1986   L5/S1 lumbar laminotomy for two ruptured discs/fusion  . LUMBAR LAMINECTOMY/DECOMPRESSION MICRODISCECTOMY [name] 07/13/2016   Procedure: LUMBAR TWO-THREE, LUMBAR THREE-FOUR, LUMBAR FOUR-FIVE LAMINECTOMY AND FORAMINOTOMY;  Surgeon: [name], MD;  Location: [facility];  Service: Neurosurgery;  Laterality: [name];  LAMINECTOMY AND FORAMINOTOMY L2-L3, L3-L4,L4-L5  . TOTAL HIP ARTHROPLASTY Bilateral 1999,2000  . TOTAL KNEE ARTHROPLASTY Right 03/06/2017   Procedure: RIGHT TOTAL KNEE ARTHROPLASTY;  Surgeon: Gaynelle Arabian, MD;  [facility];  Service: Orthopedics;  Laterality: Right;    There were no vitals filed for this visit.      Subjective Assessment - 04/03/17 1012    Subjective Patient reports increased soreness/achiness/swelling in the knee. Patient reports he was sick this weekend and was not able to perform his HEP   Pertinent History Pt underwent R TKR 03/06/17 by Dr. Wynelle Link. No reported major post-op complications. He has had HH PT following surgery. Pt was performing supine, seated, and standing exercises with therapist. Pt reports that he was able to 98 degrees of R knee flexion and is still lacking extension but is unable to recall how much he is lacking. He has  a history of lumbar laminectomy and decompression surgery 06/2016 (lumbar stenosis) and he describes what sounds like ACDF in May 2018 (cervical stenosis). History of R shoulder bursitis which started after surgery. Pt received and cortisone injection and pain has resolved almost entirely. Pt reports deconditioning since prior to back and neck surgeries. Other significant PMH includes CABG and bilateral hip THR (posterior approach)   Limitations Walking   Patient Stated  Goals Pt would like to improve his strength and endurance for working outside   Currently in Pain? Yes   Pain Score 8    Pain Location Knee   Pain Orientation Right   Pain Descriptors / Indicators Aching   Pain Type Surgical pain   Pain Onset 1 to 4 weeks ago      TREATMENT: Manual Therapy: STM to hamstrings and quadriceps to decrease pain with patient positioned in supine to decrease increased pain and spasms. Patellar mobilizations with patellar mobilizations - 2 x 30 sec in each direction  Therapeutic Exercise: Bridges in hooklying - x15  Knees to chest on physioball - x59min Isometric Hip extension/knee flexion - x5 with 5 sec holds to improve knee flexion/extension SLR in supine - x10 Seated knee flexion/extension with weighted ball - x39min  Step ups while performing dorsiflexion B - 2x20  Standing Hip abduction - 2 x 10 B  Patient demonstrates no increase in pain throughout session.           PT Education - 04/03/17 1029    Education provided Yes   Education Details HEP: dorsiflexion step ups   Person(s) Educated Patient   Methods Explanation;Demonstration   Comprehension Verbalized understanding;Returned demonstration          PT Short Term Goals - 03/28/17 1354      PT SHORT TERM GOAL #1   Title Pt will be independent with HEP in order to improve strength and balance in order to decrease fall risk and improve function at home and work.    Time 2   Period Weeks   Status New           PT Long Term Goals - 03/28/17 1354      PT LONG TERM GOAL #1   Title Pt will increase LEFS by at least 9 points in order to demonstrate significant improvement in lower extremity function.    Baseline 03/27/17: 14/80   Time 6   Period Weeks   Status New   Target Date 05/09/17     PT LONG TERM GOAL #2   Title Pt will improve R hip abduction/adduction strength by at least 1/2 MMT grade in order to improve R knee stability and ability to ambulate on uneven surfaces    Baseline 03/27/17: R hip abduction/adduction both 4-/5   Time 6   Period Weeks   Status New     PT LONG TERM GOAL #3   Title Pt will report worst R knee pain as no more than 7/10 in order to improve sleep and daily function with less pain   Baseline 03/27/17: worst 10/10   Time 6   Period Weeks     PT LONG TERM GOAL #4   Title Pt will decrease 5TSTS by at least 3 seconds in order to demonstrate clinically significant improvement in LE strength    Baseline 03/27/17: 16.7 seconds   Time 6   Period Weeks   Status New     PT LONG TERM GOAL #5   Title Pt will improve R  knee AAROM to -5 to 110 degrees in order to normalize gait pattern and improve ability to ascend/descend stairs   Baseline 03/27/17: -15 to 98 degrees AAROM   Time 6   Period Weeks   Status New               Plan - 04/03/17 1030    Clinical Impression Statement Patient demonstrates decrease is pain after performing therapeutic exercise indicating decreased guarding and improved motor control. Patient demonstrates improved ability to perform step ups today verus previous visits indicating functional carryover between sessions. Patient will benefit from further skilled therapy to return to prior level of function.    Rehab Potential Good   PT Frequency 2x / week   PT Duration 6 weeks   PT Treatment/Interventions ADLs/Self Care Home Management;Aquatic Therapy;Cryotherapy;Electrical Stimulation;Iontophoresis 4mg /ml Dexamethasone;Moist Heat;Ultrasound;DME Instruction;Gait training;Stair training;Functional mobility training;Therapeutic activities;Therapeutic exercise;Balance training;Neuromuscular re-education;Patient/family education;Manual techniques;Dry needling   PT Next Visit Plan Educate pt about proper stretching techniques for extension/flexion, consolidate HEP   PT Home Exercise Plan Standing heel raises, standing marches, standing squats, sit to stand (see instruction section)   Consulted and Agree with Plan of  Care Patient      Patient will benefit from skilled therapeutic intervention in order to improve the following deficits and impairments:  Abnormal gait, Decreased balance, Difficulty walking, Decreased range of motion, Pain, Decreased strength  Visit Diagnosis: Chronic pain of right knee  Muscle weakness (generalized)  Unsteadiness on feet     Problem List Patient Active Problem List   Diagnosis Date Noted  . Rash 03/01/2017  . Health maintenance examination 12/06/2016  . 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 Obesity, Class I, BMI 30-34.9 07/04/2016  . Low vitamin B12 level 01/01/2016  . Medicare annual wellness visit, subsequent 07/03/2015  . Advanced care planning/counseling discussion 07/03/2015  . Diet-controlled diabetes mellitus ([place]) 05/04/2015  . Angina pectoris ([place]) 05/04/2015  . Ex-smoker 05/04/2015  . Recurrent sinusitis 08/28/2014  . Benign localized hyperplasia of prostate with urinary obstruction and other lower urinary tract symptoms (LUTS)(600.21) 04/02/2013  . 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 Spermatocele 04/02/2013  . Syncopal vertigo 11/04/2010  . Lumbar disc disease with radiculopathy 11/04/2010  . INGUINAL HERNIA, RIGHT 05/26/2010  . 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 PVD (peripheral vascular disease) with claudication ([place]) 08/11/2009  . 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 Carotid stenosis 07/09/2008  . Elevated prostate specific antigen (PSA) 07/09/2008  . 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 Essential hypertension 04/30/2007  . GERD 04/30/2007  . Hyperlipidemia 04/26/2007  . OA (osteoarthritis) of knee 04/26/2007  . DVT, HX OF 04/26/2007    Blythe Stanford, PT DPT 04/03/2017, 10:33 AM  [facility] [address], [place], [address] Phone: [phone]   Fax:   [phone]  Name: [name] MRN: [id number] Date of Birth: July 16, 1941

## 2017-04-05 ENCOUNTER — Ambulatory Visit: Payer: PPO

## 2017-04-05 DIAGNOSIS — M6281 Muscle weakness (generalized): Secondary | ICD-10-CM | POA: Diagnosis not present

## 2017-04-05 DIAGNOSIS — R2681 Unsteadiness on feet: Secondary | ICD-10-CM

## 2017-04-05 DIAGNOSIS — G8929 Other chronic pain: Secondary | ICD-10-CM

## 2017-04-05 DIAGNOSIS — M25561 Pain in right knee: Principal | ICD-10-CM

## 2017-04-05 NOTE — Therapy (Signed)
[facility] November 29, [address] [place], [address] Phone: [phone]   Fax:  [phone]  Physical Therapy Treatment  Patient Details  Name: [name] MRN: [id number] Date of Birth: 1941-08-08 Referring Provider: Dr. Wynelle Link  Encounter Date: 04/05/2017      PT End of Session - 04/05/17 1138    Visit Number 4   Number of Visits 13   Date for PT Re-Evaluation 05-28-2017   Authorization Type g codes 12/12/2022   PT Start Time 11/29/13   PT Stop Time 1200   PT Time Calculation (min) 45 min   Equipment Utilized During Treatment Gait belt   Activity Tolerance Patient tolerated treatment well   Behavior During Therapy [facility] for tasks assessed/performed      Past Medical History:  Diagnosis Date  . BENIGN PROSTATIC HYPERTROPHY, WITH URINARY OBSTRUCTION 09/06/2007  . 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 Chronic prostatitis 2008/05/28  . Diabetes mellitus without complication ([place])    diet controled per dr note  . 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 DVT, HX OF 1998  . GERD 04/30/2007  . Headache    hx migraines  . History of hiatal hernia   . History of shingles   . HYPERLIPIDEMIA 04/26/2007  . HYPERTENSION 04/30/2007  . INGUINAL HERNIA, RIGHT 05/26/2010  . LOC OSTEOARTHROS NOT SPEC PRIM/SEC LOWER LEG 11/05/2009  . Lumbar disc disease with radiculopathy   . Myocardial infarction ([place]) 1989  . OSTEOARTHRITIS 04/26/2007  . 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 Pneumonia 07/2014   [facility] hospitalization  . PSA, INCREASED 07/09/2008  . Spinal stenosis of lumbar region     Past Surgical History:  Procedure Laterality Date  . APPENDECTOMY     rupture  . CARDIAC CATHETERIZATION [name] 07/07/2015   Procedure: Left Heart Cath and Cors/Grafts Angiography;  Surgeon: [name], MD;  Location: [facility];  Service: Cardiovascular;   Laterality: [name];  . CATARACT EXTRACTION, BILATERAL    . COLONOSCOPY  03/2010   HP polyp, diverticulosis, rpt 10 yrs ([name])  . CORONARY ARTERY BYPASS GRAFT  1990   3 vessel   . CYSTOSCOPY  12/23/10   Cope  . KNEE ARTHROSCOPY Right    x2  . LAMINOTOMY  1986   L5/S1 lumbar laminotomy for two ruptured discs/fusion  . LUMBAR LAMINECTOMY/DECOMPRESSION MICRODISCECTOMY [name] 07/13/2016   Procedure: LUMBAR TWO-THREE, LUMBAR THREE-FOUR, LUMBAR FOUR-FIVE LAMINECTOMY AND FORAMINOTOMY;  Surgeon: [name], MD;  Location: [facility];  Service: Neurosurgery;  Laterality: [name];  LAMINECTOMY AND FORAMINOTOMY L2-L3, L3-L4,L4-L5  . TOTAL HIP ARTHROPLASTY Bilateral 1997-11-29  . TOTAL KNEE ARTHROPLASTY Right 03/06/2017   Procedure: RIGHT TOTAL KNEE ARTHROPLASTY;  Surgeon: Gaynelle Arabian, MD;  [facility];  Service: Orthopedics;  Laterality: Right;    There were no vitals filed for this visit.      Subjective Assessment - 04/05/17 1132    Subjective Patient reports decreased pain and spasms in his knee today. Patient reports he continues to have pain and stiffness but he states it has improved.    Pertinent History Pt underwent R TKR 03/06/17 by Dr. Wynelle Link. No reported major post-op complications. He has had HH PT following surgery. Pt was performing supine, seated, and standing exercises with therapist. Pt reports that he was able to 98 degrees of R knee flexion and is still lacking extension but is unable to recall how much he  is lacking. He has a history of lumbar laminectomy and decompression surgery 06/2016 (lumbar stenosis) and he describes what sounds like ACDF in May 2018 (cervical stenosis). History of R shoulder bursitis which started after surgery. Pt received and cortisone injection and pain has resolved almost entirely. Pt reports deconditioning since prior to back and neck surgeries. Other significant PMH includes CABG and bilateral hip THR (posterior approach)   Limitations Walking   Patient  Stated Goals Pt would like to improve his strength and endurance for working outside   Currently in Pain? Yes   Pain Score 3    Pain Orientation Right   Pain Descriptors / Indicators Aching   Pain Type Surgical pain   Pain Onset 1 to 4 weeks ago   Pain Frequency Intermittent      TREATMENT: Manual Therapy: Mobilizations to tibial-femoral joint P-A to improve capsular mobility - 3 x 30 sec Grade III Patellar mobilizations- 2 x 30 sec in each direction   Therapeutic Exercise: Sidelying Clamshells - 2 x 15  Quad sets in Long sitting - 2 x 10 with 3 sec holds Seated knee flexion/extension with weighted ball - x17min Standing squats with UE support - 2 x 20 Standing Weight shifts in widened tandem stance - x20 Standing Hip abduction in standing - 2 x 20 Standing deadlifts with thigh support - x 20  Standing TKE with black tubing around R LE -- 3 x 30   Patient demonstrates no increase in pain throughout session.       PT Education - 04/05/17 1134    Education provided Yes   Education Details form/technique with exercise   Person(s) Educated Patient   Methods Explanation;Demonstration   Comprehension Verbalized understanding;Returned demonstration          PT Short Term Goals - 03/28/17 1354      PT SHORT TERM GOAL #1   Title Pt will be independent with HEP in order to improve strength and balance in order to decrease fall risk and improve function at home and work.    Time 2   Period Weeks   Status New           PT Long Term Goals - 03/28/17 1354      PT LONG TERM GOAL #1   Title Pt will increase LEFS by at least 9 points in order to demonstrate significant improvement in lower extremity function.    Baseline 03/27/17: 14/80   Time 6   Period Weeks   Status New   Target Date 05/09/17     PT LONG TERM GOAL #2   Title Pt will improve R hip abduction/adduction strength by at least 1/2 MMT grade in order to improve R knee stability and ability to ambulate on  uneven surfaces   Baseline 03/27/17: R hip abduction/adduction both 4-/5   Time 6   Period Weeks   Status New     PT LONG TERM GOAL #3   Title Pt will report worst R knee pain as no more than 7/10 in order to improve sleep and daily function with less pain   Baseline 03/27/17: worst 10/10   Time 6   Period Weeks     PT LONG TERM GOAL #4   Title Pt will decrease 5TSTS by at least 3 seconds in order to demonstrate clinically significant improvement in LE strength    Baseline 03/27/17: 16.7 seconds   Time 6   Period Weeks   Status New  PT LONG TERM GOAL #5   Title Pt will improve R knee AAROM to -5 to 110 degrees in order to normalize gait pattern and improve ability to ascend/descend stairs   Baseline 03/27/17: -15 to 98 degrees AAROM   Time 6   Period Weeks   Status New               Plan - 04/05/17 1145    Clinical Impression Statement Patient demonstrates improvement in knee symptoms today and improvement and knee extension angle. Patient demonstrates improved exercise performance with greater repetitions performed with advancement of exercises. Although patient is improving he conitnues to demonstrate decrease knee extension and strength along the R side. Patient will benefit from further skilled therapy focused on improving limitations to return to prior level of function.   Rehab Potential Good   PT Frequency 2x / week   PT Duration 6 weeks   PT Treatment/Interventions ADLs/Self Care Home Management;Aquatic Therapy;Cryotherapy;Electrical Stimulation;Iontophoresis 4mg /ml Dexamethasone;Moist Heat;Ultrasound;DME Instruction;Gait training;Stair training;Functional mobility training;Therapeutic activities;Therapeutic exercise;Balance training;Neuromuscular re-education;Patient/family education;Manual techniques;Dry needling   PT Next Visit Plan Educate pt about proper stretching techniques for extension/flexion, consolidate HEP   PT Home Exercise Plan Standing heel raises,  standing marches, standing squats, sit to stand (see instruction section)   Consulted and Agree with Plan of Care Patient      Patient will benefit from skilled therapeutic intervention in order to improve the following deficits and impairments:  Abnormal gait, Decreased balance, Difficulty walking, Decreased range of motion, Pain, Decreased strength  Visit Diagnosis: Chronic pain of right knee  Muscle weakness (generalized)  Unsteadiness on feet     Problem List Patient Active Problem List   Diagnosis Date Noted  . Rash 03/01/2017  . Health maintenance examination 12/06/2016  . 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 Obesity, Class I, BMI 30-34.9 07/04/2016  . Low vitamin B12 level 01/01/2016  . Medicare annual wellness visit, subsequent 07/03/2015  . Advanced care planning/counseling discussion 07/03/2015  . Diet-controlled diabetes mellitus ([place]) 05/04/2015  . Angina pectoris ([place]) 05/04/2015  . Ex-smoker 05/04/2015  . Recurrent sinusitis 08/28/2014  . Benign localized hyperplasia of prostate with urinary obstruction and other lower urinary tract symptoms (LUTS)(600.21) 04/02/2013  . 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 Spermatocele 04/02/2013  . Syncopal vertigo 11/04/2010  . Lumbar disc disease with radiculopathy 11/04/2010  . INGUINAL HERNIA, RIGHT 05/26/2010  . 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 PVD (peripheral vascular disease) with claudication ([place]) 08/11/2009  . 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 Carotid stenosis 07/09/2008  . Elevated prostate specific antigen (PSA) 07/09/2008  . 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 Essential hypertension 04/30/2007  . GERD 04/30/2007  . Hyperlipidemia 04/26/2007  . OA (osteoarthritis) of knee 04/26/2007  . DVT, HX OF 04/26/2007    Blythe Stanford, PT DPT 04/05/2017, 12:00 PM  [facility] [address], [place], [address] Phone: [phone]   Fax:  [phone]  Name: [name] MRN: [id number] Date of Birth: August 20, 1940

## 2017-04-10 ENCOUNTER — Ambulatory Visit: Payer: PPO

## 2017-04-10 DIAGNOSIS — M6281 Muscle weakness (generalized): Secondary | ICD-10-CM | POA: Diagnosis not present

## 2017-04-10 DIAGNOSIS — R2681 Unsteadiness on feet: Secondary | ICD-10-CM

## 2017-04-10 DIAGNOSIS — M25561 Pain in right knee: Principal | ICD-10-CM

## 2017-04-10 DIAGNOSIS — G8929 Other chronic pain: Secondary | ICD-10-CM

## 2017-04-10 NOTE — Therapy (Signed)
[facility] [address], [place], [address] Phone: [phone]   Fax:  [phone]  Physical Therapy Treatment  Patient Details  Name: [name] MRN: [id number] Date of Birth: 25-Aug-1940 Referring Provider: Dr. Wynelle Link  Encounter Date: 04/10/2017      PT End of Session - 04/10/17 1005    Visit Number 5   Number of Visits 13   Date for PT Re-Evaluation 12-May-2017   Authorization Type g codes 5/10   PT Start Time 0945   PT Stop Time 1030   PT Time Calculation (min) 45 min   Equipment Utilized During Treatment Gait belt   Activity Tolerance Patient tolerated treatment well   Behavior During Therapy [facility] for tasks assessed/performed      Past Medical History:  Diagnosis Date  . BENIGN PROSTATIC HYPERTROPHY, WITH URINARY OBSTRUCTION 09/06/2007  . 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 Chronic prostatitis May 12, 2008  . Diabetes mellitus without complication ([place])    diet controled per dr note  . 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 DVT, HX OF 1998  . GERD 04/30/2007  . Headache    hx migraines  . History of hiatal hernia   . History of shingles   . HYPERLIPIDEMIA 04/26/2007  . HYPERTENSION 04/30/2007  . INGUINAL HERNIA, RIGHT 05/26/2010  . LOC OSTEOARTHROS NOT SPEC PRIM/SEC LOWER LEG 11/05/2009  . Lumbar disc disease with radiculopathy   . Myocardial infarction ([place]) 1989  . OSTEOARTHRITIS 04/26/2007  . 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 Pneumonia 07/2014   [facility] hospitalization  . PSA, INCREASED 07/09/2008  . Spinal stenosis of lumbar region     Past Surgical History:  Procedure Laterality Date  . APPENDECTOMY     rupture  . CARDIAC CATHETERIZATION [name] 07/07/2015   Procedure: Left Heart Cath and Cors/Grafts Angiography;  Surgeon: [name], MD;  Location: [place] [facility];  Service: Cardiovascular;   Laterality: [name];  . CATARACT EXTRACTION, BILATERAL    . COLONOSCOPY  03/2010   HP polyp, diverticulosis, rpt 10 yrs ([name])  . CORONARY ARTERY BYPASS GRAFT  1990   3 vessel   . CYSTOSCOPY  12/23/10   [facility]  . KNEE ARTHROSCOPY Right    x2  . LAMINOTOMY  1986   L5/S1 lumbar laminotomy for two ruptured discs/fusion  . LUMBAR LAMINECTOMY/DECOMPRESSION MICRODISCECTOMY [name] 07/13/2016   Procedure: LUMBAR TWO-THREE, LUMBAR THREE-FOUR, LUMBAR FOUR-FIVE LAMINECTOMY AND FORAMINOTOMY;  Surgeon: [name], MD;  Location: [place];  Service: Neurosurgery;  Laterality: [name];  LAMINECTOMY AND FORAMINOTOMY L2-L3, L3-L4,L4-L5  . TOTAL HIP ARTHROPLASTY Bilateral 1999,2000  . TOTAL KNEE ARTHROPLASTY Right 03/06/2017   Procedure: RIGHT TOTAL KNEE ARTHROPLASTY;  Surgeon: Gaynelle Arabian, MD;  [facility];  Service: Orthopedics;  Laterality: Right;    There were no vitals filed for this visit.      Subjective Assessment - 04/10/17 1002    Subjective Patient reports his knee is feeling better, states decreased swelling since the previous visist. Patient reports he has increased stiffness in the knee.    Pertinent History Pt underwent R TKR 03/06/17 by Dr. Wynelle Link. No reported major post-op complications. He has had HH PT following surgery. Pt was performing supine, seated, and standing exercises with therapist. Pt reports that he was able to 98 degrees of R knee flexion and is still lacking extension but is unable to recall how much he is lacking.  He has a history of lumbar laminectomy and decompression surgery 06/2016 (lumbar stenosis) and he describes what sounds like ACDF in May 2018 (cervical stenosis). History of R shoulder bursitis which started after surgery. Pt received and cortisone injection and pain has resolved almost entirely. Pt reports deconditioning since prior to back and neck surgeries. Other significant PMH includes CABG and bilateral hip THR (posterior approach)   Limitations Walking    Patient Stated Goals Pt would like to improve his strength and endurance for working outside   Currently in Pain? Yes   Pain Score 2    Pain Location Knee   Pain Orientation Right   Pain Type Surgical pain   Pain Onset 1 to 4 weeks ago   Pain Frequency Intermittent        TREATMENT: Manual Therapy: Mobilizations to tibial-femoral joint P-A to improve capsular mobility - 3 x 30 sec Grade III Patellar mobilizations- 2 x 30 sec in each direction   Therapeutic Exercise: SLR/ Quad sets in Long sitting - 2 x 10  Seated knee flexion/extension with weighted ball - x92min Sit to stand - x10, 2 x 7 with L foot under half foam  Heel walking in standing - x 8 down and back (~30ft each length) Reaching laterally and behind with B LEs with legs to improve hip strengthening - 2 x 10  Leg Press - unilaterally 45#  x 10  Dead lift with weight shifting to the R LE - x15  Hip Machine, abduction - x 20 B 40#    Patient demonstrates no increase in pain throughout session.      PT Education - 04/10/17 1004    Education provided Yes   Education Details form/technique with exercise   Person(s) Educated Patient   Methods Explanation;Demonstration   Comprehension Verbalized understanding;Returned demonstration          PT Short Term Goals - 03/28/17 1354      PT SHORT TERM GOAL #1   Title Pt will be independent with HEP in order to improve strength and balance in order to decrease fall risk and improve function at home and work.    Time 2   Period Weeks   Status New           PT Long Term Goals - 03/28/17 1354      PT LONG TERM GOAL #1   Title Pt will increase LEFS by at least 9 points in order to demonstrate significant improvement in lower extremity function.    Baseline 03/27/17: 14/80   Time 6   Period Weeks   Status New   Target Date 05/09/17     PT LONG TERM GOAL #2   Title Pt will improve R hip abduction/adduction strength by at least 1/2 MMT grade in order to improve R  knee stability and ability to ambulate on uneven surfaces   Baseline 03/27/17: R hip abduction/adduction both 4-/5   Time 6   Period Weeks   Status New     PT LONG TERM GOAL #3   Title Pt will report worst R knee pain as no more than 7/10 in order to improve sleep and daily function with less pain   Baseline 03/27/17: worst 10/10   Time 6   Period Weeks     PT LONG TERM GOAL #4   Title Pt will decrease 5TSTS by at least 3 seconds in order to demonstrate clinically significant improvement in LE strength    Baseline 03/27/17: 16.7 seconds  Time 6   Period Weeks   Status New     PT LONG TERM GOAL #5   Title Pt will improve R knee AAROM to -5 to 110 degrees in order to normalize gait pattern and improve ability to ascend/descend stairs   Baseline 03/27/17: -15 to 98 degrees AAROM   Time 6   Period Weeks   Status New               Plan - 04/10/17 1010    Clinical Impression Statement Patient demonstrates increase weight shifting over to the lift LE and focused on improving weight shifting onto the affected side. Patient demonstrates improvement with exercise with ability to perform greater amounf of repetitions before fatigue. Patient will benefit from further skilled therapy to return to prior level of function.    Rehab Potential Good   PT Frequency 2x / week   PT Duration 6 weeks   PT Treatment/Interventions ADLs/Self Care Home Management;Aquatic Therapy;Cryotherapy;Electrical Stimulation;Iontophoresis 4mg /ml Dexamethasone;Moist Heat;Ultrasound;DME Instruction;Gait training;Stair training;Functional mobility training;Therapeutic activities;Therapeutic exercise;Balance training;Neuromuscular re-education;Patient/family education;Manual techniques;Dry needling   PT Next Visit Plan Educate pt about proper stretching techniques for extension/flexion, consolidate HEP   PT Home Exercise Plan Standing heel raises, standing marches, standing squats, sit to stand (see instruction section)    Consulted and Agree with Plan of Care Patient      Patient will benefit from skilled therapeutic intervention in order to improve the following deficits and impairments:  Abnormal gait, Decreased balance, Difficulty walking, Decreased range of motion, Pain, Decreased strength  Visit Diagnosis: Chronic pain of right knee  Muscle weakness (generalized)  Unsteadiness on feet     Problem List Patient Active Problem List   Diagnosis Date Noted  . Rash 03/01/2017  . Health maintenance examination 12/06/2016  . 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 Obesity, Class I, BMI 30-34.9 07/04/2016  . Low vitamin B12 level 01/01/2016  . Medicare annual wellness visit, subsequent 07/03/2015  . Advanced care planning/counseling discussion 07/03/2015  . Diet-controlled diabetes mellitus ([place]) 05/04/2015  . Angina pectoris ([place]) 05/04/2015  . Ex-smoker 05/04/2015  . Recurrent sinusitis 08/28/2014  . Benign localized hyperplasia of prostate with urinary obstruction and other lower urinary tract symptoms (LUTS)(600.21) 04/02/2013  . 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 Spermatocele 04/02/2013  . Syncopal vertigo 11/04/2010  . Lumbar disc disease with radiculopathy 11/04/2010  . INGUINAL HERNIA, RIGHT 05/26/2010  . 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 PVD (peripheral vascular disease) with claudication ([place]) 08/11/2009  . 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 Carotid stenosis 07/09/2008  . Elevated prostate specific antigen (PSA) 07/09/2008  . 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 Essential hypertension 04/30/2007  . GERD 04/30/2007  . Hyperlipidemia 04/26/2007  . OA (osteoarthritis) of knee 04/26/2007  . DVT, HX OF 04/26/2007    Blythe Stanford, PT DPT 04/10/2017, 10:30 AM  [facility] [address], [place],  [address] Phone: [phone]   Fax:  [phone]  Name: ADLER CHARTRAND MRN: [id number] Date of Birth: 03-27-1941

## 2017-04-11 DIAGNOSIS — Z471 Aftercare following joint replacement surgery: Secondary | ICD-10-CM | POA: Diagnosis not present

## 2017-04-11 DIAGNOSIS — M1711 Unilateral primary osteoarthritis, right knee: Secondary | ICD-10-CM | POA: Diagnosis not present

## 2017-04-11 DIAGNOSIS — Z96651 Presence of right artificial knee joint: Secondary | ICD-10-CM | POA: Diagnosis not present

## 2017-04-13 ENCOUNTER — Ambulatory Visit: Payer: PPO

## 2017-04-13 DIAGNOSIS — M25561 Pain in right knee: Principal | ICD-10-CM

## 2017-04-13 DIAGNOSIS — R2681 Unsteadiness on feet: Secondary | ICD-10-CM

## 2017-04-13 DIAGNOSIS — M6281 Muscle weakness (generalized): Secondary | ICD-10-CM | POA: Diagnosis not present

## 2017-04-13 DIAGNOSIS — G8929 Other chronic pain: Secondary | ICD-10-CM

## 2017-04-13 NOTE — Therapy (Signed)
[place] [address] [place], [address] Phone: [phone]   Fax:  [phone]  Physical Therapy Treatment  Patient Details  Name: [name] MRN: [id number] Date of Birth: 12/12/1940 Referring Provider: Dr. Wynelle Link  Encounter Date: 04/13/2017      PT End of Session - 04/13/17 1425    Visit Number 6   Number of Visits 13   Date for PT Re-Evaluation May 22, 2017   Authorization Type g codes 6/10   PT Start Time 3903   PT Stop Time 1430   PT Time Calculation (min) 45 min   Equipment Utilized During Treatment Gait belt   Activity Tolerance Patient tolerated treatment well   Behavior During Therapy [facility] for tasks assessed/performed      Past Medical History:  Diagnosis Date  . BENIGN PROSTATIC HYPERTROPHY, WITH URINARY OBSTRUCTION 09/06/2007  . 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 Chronic prostatitis 2008-05-22  . Diabetes mellitus without complication ([place])    diet controled per dr note  . 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 DVT, HX OF 1998  . GERD 04/30/2007  . Headache    hx migraines  . History of hiatal hernia   . History of shingles   . HYPERLIPIDEMIA 04/26/2007  . HYPERTENSION 04/30/2007  . INGUINAL HERNIA, RIGHT 05/26/2010  . LOC OSTEOARTHROS NOT SPEC PRIM/SEC LOWER LEG 11/05/2009  . Lumbar disc disease with radiculopathy   . Myocardial infarction ([place]) 1989  . OSTEOARTHRITIS 04/26/2007  . 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 Pneumonia 07/2014   [facility] hospitalization  . PSA, INCREASED 07/09/2008  . Spinal stenosis of lumbar region     Past Surgical History:  Procedure Laterality Date  . APPENDECTOMY     rupture  . CARDIAC CATHETERIZATION [name] 07/07/2015   Procedure: Left Heart Cath and Cors/Grafts Angiography;  Surgeon: [name], MD;  Location: [place] [facility];  Service: Cardiovascular;   Laterality: [name];  . CATARACT EXTRACTION, BILATERAL    . COLONOSCOPY  03/2010   HP polyp, diverticulosis, rpt 10 yrs ([name])  . CORONARY ARTERY BYPASS GRAFT  1990   3 vessel   . CYSTOSCOPY  12/23/10   Cope  . KNEE ARTHROSCOPY Right    x2  . LAMINOTOMY  1986   L5/S1 lumbar laminotomy for two ruptured discs/fusion  . LUMBAR LAMINECTOMY/DECOMPRESSION MICRODISCECTOMY [name] 07/13/2016   Procedure: LUMBAR TWO-THREE, LUMBAR THREE-FOUR, LUMBAR FOUR-FIVE LAMINECTOMY AND FORAMINOTOMY;  Surgeon: [name], MD;  Location: [place];  Service: Neurosurgery;  Laterality: [name];  LAMINECTOMY AND FORAMINOTOMY L2-L3, L3-L4,L4-L5  . TOTAL HIP ARTHROPLASTY Bilateral 1999,2000  . TOTAL KNEE ARTHROPLASTY Right 03/06/2017   Procedure: RIGHT TOTAL KNEE ARTHROPLASTY;  Surgeon: Gaynelle Arabian, MD;  [facility];  Service: Orthopedics;  Laterality: Right;    There were no vitals filed for this visit.      Subjective Assessment - 04/13/17 1413    Subjective Patient reports an increased "catch" in the patella. Patient states his knee is feeling much better.    Pertinent History Pt underwent R TKR 03/06/17 by Dr. Wynelle Link. No reported major post-op complications. He has had HH PT following surgery. Pt was performing supine, seated, and standing exercises with therapist. Pt reports that he was able to 98 degrees of R knee flexion and is still lacking extension but is unable to recall how much he is lacking. He has a history of lumbar laminectomy  and decompression surgery 06/2016 (lumbar stenosis) and he describes what sounds like ACDF in May 2018 (cervical stenosis). History of R shoulder bursitis which started after surgery. Pt received and cortisone injection and pain has resolved almost entirely. Pt reports deconditioning since prior to back and neck surgeries. Other significant PMH includes CABG and bilateral hip THR (posterior approach)   Limitations Walking   Patient Stated Goals Pt would like to improve his strength  and endurance for working outside   Currently in Pain? Yes   Pain Score 2    Pain Location Knee   Pain Orientation Right   Pain Descriptors / Indicators Aching   Pain Type Surgical pain   Pain Onset 1 to 4 weeks ago   Pain Frequency Intermittent      TREATMENT: Manual Therapy: Scar tissue massage to the scar most specifically over the patella to decrease scar tissue adhesions. Grade III Patellar mobilizations- 2 x 30 sec in each direction   Therapeutic Exercise: SLR/ Quad sets in Long sitting - 2 x 10  Seated knee flexion/extension with weighted ball - x64min Sit to stand -  3 x 10  with L foot under half foam  Heel lifts in standing - 3 x 10 with UE support Heel walking in standing - x 8 down and back (~39ft each length) Side stepping with black tubing - x 10 B Step ups with 6" - x 20 performed with Dorsiflexion Dead lift with weight shifting to the R LE - x15     Patient demonstrates no increase in pain throughout session.        PT Education - 04/13/17 1424    Education provided Yes   Education Details form/technique with exercise   Person(s) Educated Patient   Methods Explanation;Demonstration   Comprehension Verbalized understanding;Returned demonstration          PT Short Term Goals - 03/28/17 1354      PT SHORT TERM GOAL #1   Title Pt will be independent with HEP in order to improve strength and balance in order to decrease fall risk and improve function at home and work.    Time 2   Period Weeks   Status New           PT Long Term Goals - 03/28/17 1354      PT LONG TERM GOAL #1   Title Pt will increase LEFS by at least 9 points in order to demonstrate significant improvement in lower extremity function.    Baseline 03/27/17: 14/80   Time 6   Period Weeks   Status New   Target Date 05/09/17     PT LONG TERM GOAL #2   Title Pt will improve R hip abduction/adduction strength by at least 1/2 MMT grade in order to improve R knee stability and ability  to ambulate on uneven surfaces   Baseline 03/27/17: R hip abduction/adduction both 4-/5   Time 6   Period Weeks   Status New     PT LONG TERM GOAL #3   Title Pt will report worst R knee pain as no more than 7/10 in order to improve sleep and daily function with less pain   Baseline 03/27/17: worst 10/10   Time 6   Period Weeks     PT LONG TERM GOAL #4   Title Pt will decrease 5TSTS by at least 3 seconds in order to demonstrate clinically significant improvement in LE strength    Baseline 03/27/17: 16.7 seconds  Time 6   Period Weeks   Status New     PT LONG TERM GOAL #5   Title Pt will improve R knee AAROM to -5 to 110 degrees in order to normalize gait pattern and improve ability to ascend/descend stairs   Baseline 03/27/17: -15 to 98 degrees AAROM   Time 6   Period Weeks   Status New               Plan - 04/13/17 1426    Clinical Impression Statement Patient demonstrates improvement with weight acceptance onto affected LE as indicated by ability to perform greater amount of exercises before onset of fatigue. Patient demonstrates decreased strength most notably when performing step ups. Patient will benefit from further skilled therapy focused on improving limitations to return to prior level of function.    Rehab Potential Good   PT Frequency 2x / week   PT Duration 6 weeks   PT Treatment/Interventions ADLs/Self Care Home Management;Aquatic Therapy;Cryotherapy;Electrical Stimulation;Iontophoresis 4mg /ml Dexamethasone;Moist Heat;Ultrasound;DME Instruction;Gait training;Stair training;Functional mobility training;Therapeutic activities;Therapeutic exercise;Balance training;Neuromuscular re-education;Patient/family education;Manual techniques;Dry needling   PT Next Visit Plan Educate pt about proper stretching techniques for extension/flexion, consolidate HEP   PT Home Exercise Plan Standing heel raises, standing marches, standing squats, sit to stand (see instruction section)    Consulted and Agree with Plan of Care Patient      Patient will benefit from skilled therapeutic intervention in order to improve the following deficits and impairments:  Abnormal gait, Decreased balance, Difficulty walking, Decreased range of motion, Pain, Decreased strength  Visit Diagnosis: Chronic pain of right knee  Muscle weakness (generalized)  Unsteadiness on feet     Problem List Patient Active Problem List   Diagnosis Date Noted  . Rash 03/01/2017  . Health maintenance examination 12/06/2016  . 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 Obesity, Class I, BMI 30-34.9 07/04/2016  . Low vitamin B12 level 01/01/2016  . Medicare annual wellness visit, subsequent 07/03/2015  . Advanced care planning/counseling discussion 07/03/2015  . Diet-controlled diabetes mellitus ([place]) 05/04/2015  . Angina pectoris ([place]) 05/04/2015  . Ex-smoker 05/04/2015  . Recurrent sinusitis 08/28/2014  . Benign localized hyperplasia of prostate with urinary obstruction and other lower urinary tract symptoms (LUTS)(600.21) 04/02/2013  . 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 Spermatocele 04/02/2013  . Syncopal vertigo 11/04/2010  . Lumbar disc disease with radiculopathy 11/04/2010  . INGUINAL HERNIA, RIGHT 05/26/2010  . 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 PVD (peripheral vascular disease) with claudication ([place]) 08/11/2009  . 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 Carotid stenosis 07/09/2008  . Elevated prostate specific antigen (PSA) 07/09/2008  . 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 Essential hypertension 04/30/2007  . GERD 04/30/2007  . Hyperlipidemia 04/26/2007  . OA (osteoarthritis) of knee 04/26/2007  . DVT, HX OF 04/26/2007    Blythe Stanford, PT DPT 04/13/2017, 2:32 PM  [place] [facility] [address] [place],  [address] Phone: [phone]   Fax:  [phone]  Name: [name] MRN: [id number] Date of Birth: February 02, 1941

## 2017-04-18 ENCOUNTER — Telehealth: Payer: Self-pay

## 2017-04-18 ENCOUNTER — Ambulatory Visit: Payer: PPO | Attending: Orthopedic Surgery

## 2017-04-18 DIAGNOSIS — M25561 Pain in right knee: Secondary | ICD-10-CM | POA: Insufficient documentation

## 2017-04-18 DIAGNOSIS — R2681 Unsteadiness on feet: Secondary | ICD-10-CM | POA: Diagnosis not present

## 2017-04-18 DIAGNOSIS — G8929 Other chronic pain: Secondary | ICD-10-CM | POA: Diagnosis not present

## 2017-04-18 DIAGNOSIS — M6281 Muscle weakness (generalized): Secondary | ICD-10-CM | POA: Diagnosis not present

## 2017-04-18 NOTE — Therapy (Signed)
[facility] [address], [place], [address] Phone: [phone]   Fax:  [phone]  Physical Therapy Treatment  Patient Details  Name: [name] MRN: [id number] Date of Birth: 05-07-1941 Referring Provider: Dr. Wynelle Link  Encounter Date: 04/18/2017      PT End of Session - 04/18/17 1335    Visit Number 7   Number of Visits 13   Date for PT Re-Evaluation May 28, 2017   Authorization Type g codes 7/10   PT Start Time 1607   PT Stop Time 1430   PT Time Calculation (min) 45 min   Equipment Utilized During Treatment Gait belt   Activity Tolerance Patient tolerated treatment well   Behavior During Therapy [facility] for tasks assessed/performed      Past Medical History:  Diagnosis Date  . BENIGN PROSTATIC HYPERTROPHY, WITH URINARY OBSTRUCTION 09/06/2007  . 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/28/08  . Diabetes mellitus without complication ([place])    diet controled per dr note  . 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 DVT, HX OF 1998  . GERD 04/30/2007  . Headache    hx migraines  . History of hiatal hernia   . History of shingles   . HYPERLIPIDEMIA 04/26/2007  . HYPERTENSION 04/30/2007  . INGUINAL HERNIA, RIGHT 05/26/2010  . LOC OSTEOARTHROS NOT SPEC PRIM/SEC LOWER LEG 11/05/2009  . Lumbar disc disease with radiculopathy   . Myocardial infarction ([place]) 1989  . OSTEOARTHRITIS 04/26/2007  . 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 Pneumonia 07/2014   [facility] hospitalization  . PSA, INCREASED 07/09/2008  . Spinal stenosis of lumbar region     Past Surgical History:  Procedure Laterality Date  . APPENDECTOMY     rupture  . CARDIAC CATHETERIZATION [name] 07/07/2015   Procedure: Left Heart Cath and Cors/Grafts Angiography;  Surgeon: [name], MD;  Location: [place] [facility];  Service: Cardiovascular;   Laterality: [name];  . CATARACT EXTRACTION, BILATERAL    . COLONOSCOPY  03/2010   HP polyp, diverticulosis, rpt 10 yrs ([name])  . CORONARY ARTERY BYPASS GRAFT  1990   3 vessel   . CYSTOSCOPY  12/23/10   Cope  . KNEE ARTHROSCOPY Right    x2  . LAMINOTOMY  1986   L5/S1 lumbar laminotomy for two ruptured discs/fusion  . LUMBAR LAMINECTOMY/DECOMPRESSION MICRODISCECTOMY [name] 07/13/2016   Procedure: LUMBAR TWO-THREE, LUMBAR THREE-FOUR, LUMBAR FOUR-FIVE LAMINECTOMY AND FORAMINOTOMY;  Surgeon: [name], MD;  Location: [facility];  Service: Neurosurgery;  Laterality: [name];  LAMINECTOMY AND FORAMINOTOMY L2-L3, L3-L4,L4-L5  . TOTAL HIP ARTHROPLASTY Bilateral 1999,2000  . TOTAL KNEE ARTHROPLASTY Right 03/06/2017   Procedure: RIGHT TOTAL KNEE ARTHROPLASTY;  Surgeon: Gaynelle Arabian, MD;  Location: [facility];  Service: Orthopedics;  Laterality: Right;    There were no vitals filed for this visit.      Subjective Assessment - 04/18/17 1327    Subjective Patient reports increased LBP after the previous session. Patient reports his knee feeling tight today.    Pertinent History Pt underwent R TKR 03/06/17 by Dr. Wynelle Link. No reported major post-op complications. He has had HH PT following surgery. Pt was performing supine, seated, and standing exercises with therapist. Pt reports that he was able to 98 degrees of R knee flexion and is still lacking extension but is unable to recall how much he is lacking. He has a history of lumbar laminectomy and  decompression surgery 06/2016 (lumbar stenosis) and he describes what sounds like ACDF in May 2018 (cervical stenosis). History of R shoulder bursitis which started after surgery. Pt received and cortisone injection and pain has resolved almost entirely. Pt reports deconditioning since prior to back and neck surgeries. Other significant PMH includes CABG and bilateral hip THR (posterior approach)   Limitations Walking   Patient Stated Goals Pt would like to improve his  strength and endurance for working outside   Currently in Pain? No/denies   Pain Onset 1 to 4 weeks ago        TREATMENT: Manual Therapy: Scar tissue massage to the scar most specifically over the patella to decrease scar tissue adhesions. Grade III Patellar mobilizations- 2 x 30 sec in each direction   Therapeutic Exercise: Quad sets - x 5 on the R side  Seated knee flexion/extension with weighted ball - x5min Sit to stand -  x10; x20  with L foot under half foam  Heel lifts in standing - 2 x 10 with UE support unilaterally on R Dead lift with weight shifting to the R LE - x15  Hip abduction Hip machine - 2x20 40# Total gym level 16 single leg - 2 x 10, x15; x 20  Standing Hip ER in standing with UE support - x10B Knee bending  in standing - x 20 on the R LE     Patient demonstrates no increase in pain throughout session.        PT Education - 04/18/17 1335    Education provided Yes   Education Details form/technique with exercise   Person(s) Educated Patient   Methods Explanation;Demonstration   Comprehension Verbalized understanding;Returned demonstration          PT Short Term Goals - 03/28/17 1354      PT SHORT TERM GOAL #1   Title Pt will be independent with HEP in order to improve strength and balance in order to decrease fall risk and improve function at home and work.    Time 2   Period Weeks   Status New           PT Long Term Goals - 03/28/17 1354      PT LONG TERM GOAL #1   Title Pt will increase LEFS by at least 9 points in order to demonstrate significant improvement in lower extremity function.    Baseline 03/27/17: 14/80   Time 6   Period Weeks   Status New   Target Date 05/09/17     PT LONG TERM GOAL #2   Title Pt will improve R hip abduction/adduction strength by at least 1/2 MMT grade in order to improve R knee stability and ability to ambulate on uneven surfaces   Baseline 03/27/17: R hip abduction/adduction both 4-/5   Time 6    Period Weeks   Status New     PT LONG TERM GOAL #3   Title Pt will report worst R knee pain as no more than 7/10 in order to improve sleep and daily function with less pain   Baseline 03/27/17: worst 10/10   Time 6   Period Weeks     PT LONG TERM GOAL #4   Title Pt will decrease 5TSTS by at least 3 seconds in order to demonstrate clinically significant improvement in LE strength    Baseline 03/27/17: 16.7 seconds   Time 6   Period Weeks   Status New     PT LONG TERM GOAL #5  Title Pt will improve R knee AAROM to -5 to 110 degrees in order to normalize gait pattern and improve ability to ascend/descend stairs   Baseline 03/27/17: -15 to 98 degrees AAROM   Time 6   Period Weeks   Status New               Plan - 04/18/17 1343    Clinical Impression Statement Paitent demonstrates improvement in knee extension after exercise performance indicating improvement in motor control and strength. Patient continues to demonstrates decreased full AROM with knee extension and flexion,  decreased muscular strength within the quads/hamstrings, and decreased hip strength. Patient will benefit from further skilled therapy to return to prior level of function.    Rehab Potential Good   PT Frequency 2x / week   PT Duration 6 weeks   PT Treatment/Interventions ADLs/Self Care Home Management;Aquatic Therapy;Cryotherapy;Electrical Stimulation;Iontophoresis 4mg /ml Dexamethasone;Moist Heat;Ultrasound;DME Instruction;Gait training;Stair training;Functional mobility training;Therapeutic activities;Therapeutic exercise;Balance training;Neuromuscular re-education;Patient/family education;Manual techniques;Dry needling   PT Next Visit Plan Educate pt about proper stretching techniques for extension/flexion, consolidate HEP   PT Home Exercise Plan Standing heel raises, standing marches, standing squats, sit to stand (see instruction section)   Consulted and Agree with Plan of Care Patient      Patient will  benefit from skilled therapeutic intervention in order to improve the following deficits and impairments:  Abnormal gait, Decreased balance, Difficulty walking, Decreased range of motion, Pain, Decreased strength  Visit Diagnosis: Chronic pain of right knee  Muscle weakness (generalized)  Unsteadiness on feet     Problem List Patient Active Problem List   Diagnosis Date Noted  . Rash 03/01/2017  . Health maintenance examination 12/06/2016  . 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 Obesity, Class I, BMI 30-34.9 07/04/2016  . Low vitamin B12 level 01/01/2016  . Medicare annual wellness visit, subsequent 07/03/2015  . Advanced care planning/counseling discussion 07/03/2015  . Diet-controlled diabetes mellitus ([place]) 05/04/2015  . Angina pectoris ([place]) 05/04/2015  . Ex-smoker 05/04/2015  . Recurrent sinusitis 08/28/2014  . Benign localized hyperplasia of prostate with urinary obstruction and other lower urinary tract symptoms (LUTS)(600.21) 04/02/2013  . 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 Spermatocele 04/02/2013  . Syncopal vertigo 11/04/2010  . Lumbar disc disease with radiculopathy 11/04/2010  . INGUINAL HERNIA, RIGHT 05/26/2010  . 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 PVD (peripheral vascular disease) with claudication ([place]) 08/11/2009  . 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 Carotid stenosis 07/09/2008  . Elevated prostate specific antigen (PSA) 07/09/2008  . 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 Essential hypertension 04/30/2007  . GERD 04/30/2007  . Hyperlipidemia 04/26/2007  . OA (osteoarthritis) of knee 04/26/2007  . DVT, HX OF 04/26/2007    Blythe Stanford, PT DPT 04/18/2017, 1:48 PM  [facility] [address] [place], [address] Phone: [phone]   Fax:  [phone]  Name: NELVIN TOMB MRN: [id number] Date of Birth: May 07, 1941

## 2017-04-18 NOTE — Telephone Encounter (Signed)
I spoke with pt and he did not go to ED; pt passed gas and abd pain has gone away; pt has had loose stools x 3 today and if loose BM continues pt will call in AM for appt. If condition worsens during night pt will go to ED. FYI to Dr Darnell Level.

## 2017-04-20 ENCOUNTER — Other Ambulatory Visit: Payer: Self-pay | Admitting: Cardiovascular Disease

## 2017-04-20 ENCOUNTER — Ambulatory Visit: Payer: PPO

## 2017-04-20 DIAGNOSIS — M25561 Pain in right knee: Secondary | ICD-10-CM | POA: Diagnosis not present

## 2017-04-20 DIAGNOSIS — M6281 Muscle weakness (generalized): Secondary | ICD-10-CM

## 2017-04-20 DIAGNOSIS — G8929 Other chronic pain: Secondary | ICD-10-CM

## 2017-04-20 NOTE — Therapy (Signed)
[facility] [address] [place], [address] Phone: [phone]   Fax:  [phone]  Physical Therapy Treatment  Patient Details  Name: [name] MRN: [id number] Date of Birth: 1941/05/10 Referring Provider: Dr. Wynelle Link  Encounter Date: 04/20/2017      PT End of Session - 04/20/17 1343    Visit Number 8   Number of Visits 13   Date for PT Re-Evaluation 12-May-2017   Authorization Type g codes 8/10   PT Start Time 1300   PT Stop Time 0145   PT Time Calculation (min) 765 min   Equipment Utilized During Treatment Gait belt   Activity Tolerance Patient tolerated treatment well   Behavior During Therapy [facility] for tasks assessed/performed      Past Medical History:  Diagnosis Date  . BENIGN PROSTATIC HYPERTROPHY, WITH URINARY OBSTRUCTION 09/06/2007  . 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 Chronic prostatitis 2008/05/12  . Diabetes mellitus without complication ([place])    diet controled per dr note  . 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 DVT, HX OF 1998  . GERD 04/30/2007  . Headache    hx migraines  . History of hiatal hernia   . History of shingles   . HYPERLIPIDEMIA 04/26/2007  . HYPERTENSION 04/30/2007  . INGUINAL HERNIA, RIGHT 05/26/2010  . LOC OSTEOARTHROS NOT SPEC PRIM/SEC LOWER LEG 11/05/2009  . Lumbar disc disease with radiculopathy   . Myocardial infarction ([place]) 1989  . OSTEOARTHRITIS 04/26/2007  . 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 Pneumonia 07/2014   [facility] hospitalization  . PSA, INCREASED 07/09/2008  . Spinal stenosis of lumbar region     Past Surgical History:  Procedure Laterality Date  . APPENDECTOMY     rupture  . CARDIAC CATHETERIZATION [name] 07/07/2015   Procedure: Left Heart Cath and Cors/Grafts Angiography;  Surgeon: [name], MD;  Location: [facility];  Service: Cardiovascular;   Laterality: [name];  . CATARACT EXTRACTION, BILATERAL    . COLONOSCOPY  03/2010   HP polyp, diverticulosis, rpt 10 yrs ([name])  . CORONARY ARTERY BYPASS GRAFT  1990   3 vessel   . CYSTOSCOPY  12/23/10   Cope  . KNEE ARTHROSCOPY Right    x2  . LAMINOTOMY  1986   L5/S1 lumbar laminotomy for two ruptured discs/fusion  . LUMBAR LAMINECTOMY/DECOMPRESSION MICRODISCECTOMY [name] 07/13/2016   Procedure: LUMBAR TWO-THREE, LUMBAR THREE-FOUR, LUMBAR FOUR-FIVE LAMINECTOMY AND FORAMINOTOMY;  Surgeon: [name], MD;  Location: [facility];  Service: Neurosurgery;  Laterality: [name];  LAMINECTOMY AND FORAMINOTOMY L2-L3, L3-L4,L4-L5  . TOTAL HIP ARTHROPLASTY Bilateral 1999,2000  . TOTAL KNEE ARTHROPLASTY Right 03/06/2017   Procedure: RIGHT TOTAL KNEE ARTHROPLASTY;  Surgeon: Gaynelle Arabian, MD;  [facility];  Service: Orthopedics;  Laterality: Right;    There were no vitals filed for this visit.      Subjective Assessment - 04/20/17 1340    Subjective Patient reports he was able to sleep througout the night last evening wihtout waking up (first time he was able to do this). Patient feels the knee is improving.    Pertinent History Pt underwent R TKR 03/06/17 by Dr. Wynelle Link. No reported major post-op complications. He has had HH PT following surgery. Pt was performing supine, seated, and standing exercises with therapist. Pt reports that he was able to 98 degrees of R knee flexion and is still lacking extension but is unable to  recall how much he is lacking. He has a history of lumbar laminectomy and decompression surgery 06/2016 (lumbar stenosis) and he describes what sounds like ACDF in May 2018 (cervical stenosis). History of R shoulder bursitis which started after surgery. Pt received and cortisone injection and pain has resolved almost entirely. Pt reports deconditioning since prior to back and neck surgeries. Other significant PMH includes CABG and bilateral hip THR (posterior approach)   Limitations Walking    Patient Stated Goals Pt would like to improve his strength and endurance for working outside   Currently in Pain? No/denies   Pain Onset 1 to 4 weeks ago      TREATMENT: Manual Therapy: Scar tissue massage to the scar most specifically over the patella to decrease scar tissue adhesions. Grade III Patellar mobilizations- 2 x 30 sec in each direction   Therapeutic Exercise: Quad sets - x 5 on the R side  Sit to stand -  2 x20  with L foot out in front Total gym level 18 single leg - 3 x 10,  Side stepping down and back on airex beam - 2x 10 with intermittent UE support Hip abduction Hip machine - 2x20 40#; hip extension  Walking in tandem along board with intermittent UE support - x10 Step ups onto bosu ball - x10 B with UE support Step ups onto bosu ball with marches - x 8 B LE     Patient demonstrates no increase in pain throughout session.       PT Education - 04/20/17 1342    Education provided Yes   Education Details form/technique with exercise   Person(s) Educated Patient   Methods Explanation;Demonstration   Comprehension Verbalized understanding;Returned demonstration          PT Short Term Goals - 03/28/17 1354      PT SHORT TERM GOAL #1   Title Pt will be independent with HEP in order to improve strength and balance in order to decrease fall risk and improve function at home and work.    Time 2   Period Weeks   Status New           PT Long Term Goals - 03/28/17 1354      PT LONG TERM GOAL #1   Title Pt will increase LEFS by at least 9 points in order to demonstrate significant improvement in lower extremity function.    Baseline 03/27/17: 14/80   Time 6   Period Weeks   Status New   Target Date 05/09/17     PT LONG TERM GOAL #2   Title Pt will improve R hip abduction/adduction strength by at least 1/2 MMT grade in order to improve R knee stability and ability to ambulate on uneven surfaces   Baseline 03/27/17: R hip abduction/adduction both 4-/5    Time 6   Period Weeks   Status New     PT LONG TERM GOAL #3   Title Pt will report worst R knee pain as no more than 7/10 in order to improve sleep and daily function with less pain   Baseline 03/27/17: worst 10/10   Time 6   Period Weeks     PT LONG TERM GOAL #4   Title Pt will decrease 5TSTS by at least 3 seconds in order to demonstrate clinically significant improvement in LE strength    Baseline 03/27/17: 16.7 seconds   Time 6   Period Weeks   Status New     PT LONG TERM  GOAL #5   Title Pt will improve R knee AAROM to -5 to 110 degrees in order to normalize gait pattern and improve ability to ascend/descend stairs   Baseline 03/27/17: -15 to 98 degrees AAROM   Time 6   Period Weeks   Status New               Plan - 04/20/17 1344    Clinical Impression Statement Progressed patient's LE strength exercises and focused on improving balance in standing. Patient demonstrates decreased muscular strength with activity and decreased balance as indicated by requiring UE support to perform exercises and early onset of fatigue. Patient will benefit from further skilled therapy to return to prior level of function.    Rehab Potential Good   PT Frequency 2x / week   PT Duration 6 weeks   PT Treatment/Interventions ADLs/Self Care Home Management;Aquatic Therapy;Cryotherapy;Electrical Stimulation;Iontophoresis 4mg /ml Dexamethasone;Moist Heat;Ultrasound;DME Instruction;Gait training;Stair training;Functional mobility training;Therapeutic activities;Therapeutic exercise;Balance training;Neuromuscular re-education;Patient/family education;Manual techniques;Dry needling   PT Next Visit Plan Educate pt about proper stretching techniques for extension/flexion, consolidate HEP   PT Home Exercise Plan Standing heel raises, standing marches, standing squats, sit to stand (see instruction section)   Consulted and Agree with Plan of Care Patient      Patient will benefit from skilled  therapeutic intervention in order to improve the following deficits and impairments:  Abnormal gait, Decreased balance, Difficulty walking, Decreased range of motion, Pain, Decreased strength  Visit Diagnosis: Chronic pain of right knee  Muscle weakness (generalized)     Problem List Patient Active Problem List   Diagnosis Date Noted  . Rash 03/01/2017  . Health maintenance examination 12/06/2016  . 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 Obesity, Class I, BMI 30-34.9 07/04/2016  . Low vitamin B12 level 01/01/2016  . Medicare annual wellness visit, subsequent 07/03/2015  . Advanced care planning/counseling discussion 07/03/2015  . Diet-controlled diabetes mellitus ([place]) 05/04/2015  . Angina pectoris ([place]) 05/04/2015  . Ex-smoker 05/04/2015  . Recurrent sinusitis 08/28/2014  . Benign localized hyperplasia of prostate with urinary obstruction and other lower urinary tract symptoms (LUTS)(600.21) 04/02/2013  . 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 Spermatocele 04/02/2013  . Syncopal vertigo 11/04/2010  . Lumbar disc disease with radiculopathy 11/04/2010  . INGUINAL HERNIA, RIGHT 05/26/2010  . 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 PVD (peripheral vascular disease) with claudication ([place]) 08/11/2009  . 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 Carotid stenosis 07/09/2008  . Elevated prostate specific antigen (PSA) 07/09/2008  . 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 Essential hypertension 04/30/2007  . GERD 04/30/2007  . Hyperlipidemia 04/26/2007  . OA (osteoarthritis) of knee 04/26/2007  . DVT, HX OF 04/26/2007    Blythe Stanford, PT DPT 04/20/2017, 1:49 PM  [facility] [address] [place], [address] Phone: [phone]   Fax:  [phone]  Name: NORVIL MARTENSEN MRN: [id number] Date of Birth:  [birth year]

## 2017-04-24 ENCOUNTER — Ambulatory Visit: Payer: PPO

## 2017-04-24 DIAGNOSIS — R2681 Unsteadiness on feet: Secondary | ICD-10-CM

## 2017-04-24 DIAGNOSIS — M25561 Pain in right knee: Secondary | ICD-10-CM | POA: Diagnosis not present

## 2017-04-24 DIAGNOSIS — M6281 Muscle weakness (generalized): Secondary | ICD-10-CM

## 2017-04-24 DIAGNOSIS — G8929 Other chronic pain: Secondary | ICD-10-CM

## 2017-04-24 NOTE — Therapy (Signed)
[facility] [address] [place], [address] Phone: [phone]   Fax:  [phone]  Physical Therapy Treatment  Patient Details  Name: [name] MRN: [id number] Date of Birth: Aug 02, 1941 Referring Provider: Dr. Wynelle Link  Encounter Date: 04-29-2017      PT End of Session - 04-29-2017 1148    Visit Number 9   Number of Visits 13   Date for PT Re-Evaluation 2017-05-14   Authorization Type g codes 04-30-23   PT Start Time 1115   PT Stop Time 1200   PT Time Calculation (min) 45 min   Equipment Utilized During Treatment Gait belt   Activity Tolerance Patient tolerated treatment well   Behavior During Therapy [facility] for tasks assessed/performed      Past Medical History:  Diagnosis Date  . BENIGN PROSTATIC HYPERTROPHY, WITH URINARY OBSTRUCTION 09/06/2007  . 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 Chronic prostatitis 2008-05-14  . Diabetes mellitus without complication ([place])    diet controled per dr note  . 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 DVT, HX OF 1998  . GERD 04/30/2007  . Headache    hx migraines  . History of hiatal hernia   . History of shingles   . HYPERLIPIDEMIA 04/26/2007  . HYPERTENSION 04/30/2007  . INGUINAL HERNIA, RIGHT 05/26/2010  . LOC OSTEOARTHROS NOT SPEC PRIM/SEC LOWER LEG 11/05/2009  . Lumbar disc disease with radiculopathy   . Myocardial infarction ([place]) 1989  . OSTEOARTHRITIS 04/26/2007  . 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 Pneumonia 07/2014   [facility] hospitalization  . PSA, INCREASED 07/09/2008  . Spinal stenosis of lumbar region     Past Surgical History:  Procedure Laterality Date  . APPENDECTOMY     rupture  . CARDIAC CATHETERIZATION [name] 07/07/2015   Procedure: Left Heart Cath and Cors/Grafts Angiography;  Surgeon: [name], MD;  Location: [facility];  Service: Cardiovascular;   Laterality: [name];  . CATARACT EXTRACTION, BILATERAL    . COLONOSCOPY  03/2010   HP polyp, diverticulosis, rpt 10 yrs ([name])  . CORONARY ARTERY BYPASS GRAFT  1990   3 vessel   . CYSTOSCOPY  12/23/10   [facility]  . KNEE ARTHROSCOPY Right    x2  . LAMINOTOMY  1986   L5/S1 lumbar laminotomy for two ruptured discs/fusion  . LUMBAR LAMINECTOMY/DECOMPRESSION MICRODISCECTOMY [name] 07/13/2016   Procedure: LUMBAR TWO-THREE, LUMBAR THREE-FOUR, LUMBAR FOUR-FIVE LAMINECTOMY AND FORAMINOTOMY;  Surgeon: [name], MD;  Location: [facility];  Service: Neurosurgery;  Laterality: [name];  LAMINECTOMY AND FORAMINOTOMY L2-L3, L3-L4,L4-L5  . TOTAL HIP ARTHROPLASTY Bilateral 1999,2000  . TOTAL KNEE ARTHROPLASTY Right 03/06/2017   Procedure: RIGHT TOTAL KNEE ARTHROPLASTY;  Surgeon: Gaynelle Arabian, MD;  [facility];  Service: Orthopedics;  Laterality: Right;    There were no vitals filed for this visit.      Subjective Assessment - 04/29/17 1142    Subjective Patient reports increased pain at night after performing a lot of walking throughout the day. Patient states he feels he's been doing too much activity at home and needs to decrease.    Pertinent History Pt underwent R TKR 03/06/17 by Dr. Wynelle Link. No reported major post-op complications. He has had HH PT following surgery. Pt was performing supine, seated, and standing exercises with therapist. Pt reports that he was able to 98 degrees of R knee flexion and is still lacking extension but is  unable to recall how much he is lacking. He has a history of lumbar laminectomy and decompression surgery 06/2016 (lumbar stenosis) and he describes what sounds like ACDF in May 2018 (cervical stenosis). History of R shoulder bursitis which started after surgery. Pt received and cortisone injection and pain has resolved almost entirely. Pt reports deconditioning since prior to back and neck surgeries. Other significant PMH includes CABG and bilateral hip THR (posterior approach)    Limitations Walking   Patient Stated Goals Pt would like to improve his strength and endurance for working outside   Currently in Pain? Yes   Pain Score 2    Pain Location Knee   Pain Orientation Right   Pain Descriptors / Indicators Aching   Pain Type Surgical pain   Pain Onset 1 to 4 weeks ago   Pain Frequency Intermittent        TREATMENT: Manual Therapy: Scar tissue massage to the scar most specifically over the patella to decrease scar tissue adhesions. Grade III Patellar mobilizations- 2 x 30 sec in each direction with patellar mobilizer. STM to patient's hamstrings on the affected side to decreased spasms and decrease pain.    Therapeutic Exercise: Heel walking down and back at side of treadmill - x7  Lunges in standing static - 2 x 3 B Single leg Heel raises B - 3 x 15 B Squats in standing with UE support while performing dorsiflexion - 2 x 12 Total gym level 19 single leg - 2 x 10 B Hip abduction Hip machine - 2x20 40#; hip extension  Step ups onto the second step (~14") - x 10 B    Patient demonstrates increased LE at end of the session.          PT Education - 04/24/17 1147    Education provided Yes   Education Details form/technique with exercises   Person(s) Educated Patient   Methods Explanation;Demonstration   Comprehension Verbalized understanding;Returned demonstration          PT Short Term Goals - 03/28/17 1354      PT SHORT TERM GOAL #1   Title Pt will be independent with HEP in order to improve strength and balance in order to decrease fall risk and improve function at home and work.    Time 2   Period Weeks   Status New           PT Long Term Goals - 03/28/17 1354      PT LONG TERM GOAL #1   Title Pt will increase LEFS by at least 9 points in order to demonstrate significant improvement in lower extremity function.    Baseline 03/27/17: 14/80   Time 6   Period Weeks   Status New   Target Date 05/09/17     PT LONG TERM GOAL #2    Title Pt will improve R hip abduction/adduction strength by at least 1/2 MMT grade in order to improve R knee stability and ability to ambulate on uneven surfaces   Baseline 03/27/17: R hip abduction/adduction both 4-/5   Time 6   Period Weeks   Status New     PT LONG TERM GOAL #3   Title Pt will report worst R knee pain as no more than 7/10 in order to improve sleep and daily function with less pain   Baseline 03/27/17: worst 10/10   Time 6   Period Weeks     PT LONG TERM GOAL #4   Title Pt will decrease 5TSTS  by at least 3 seconds in order to demonstrate clinically significant improvement in LE strength    Baseline 03/27/17: 16.7 seconds   Time 6   Period Weeks   Status New     PT LONG TERM GOAL #5   Title Pt will improve R knee AAROM to -5 to 110 degrees in order to normalize gait pattern and improve ability to ascend/descend stairs   Baseline 03/27/17: -15 to 98 degrees AAROM   Time 6   Period Weeks   Status New               Plan - 04/24/17 1149    Clinical Impression Statement Patient demonstrates decreased AROM within his knee today compared to the previous sessions. Patient demonstrates improved AROM after performing manual therapy to the knee indicating improved muscle elasticty and joint movement. Patient demonstrates significant fatigue at the end of the session indicating decreasedd muscular strength/endurance patient will benefit from further skilled therapy to return to prior level of function.    Rehab Potential Good   PT Frequency 2x / week   PT Duration 6 weeks   PT Treatment/Interventions ADLs/Self Care Home Management;Aquatic Therapy;Cryotherapy;Electrical Stimulation;Iontophoresis 4mg /ml Dexamethasone;Moist Heat;Ultrasound;DME Instruction;Gait training;Stair training;Functional mobility training;Therapeutic activities;Therapeutic exercise;Balance training;Neuromuscular re-education;Patient/family education;Manual techniques;Dry needling   PT Next Visit Plan  Educate pt about proper stretching techniques for extension/flexion, consolidate HEP   PT Home Exercise Plan Standing heel raises, standing marches, standing squats, sit to stand (see instruction section)   Consulted and Agree with Plan of Care Patient      Patient will benefit from skilled therapeutic intervention in order to improve the following deficits and impairments:  Abnormal gait, Decreased balance, Difficulty walking, Decreased range of motion, Pain, Decreased strength  Visit Diagnosis: Chronic pain of right knee  Muscle weakness (generalized)  Unsteadiness on feet     Problem List Patient Active Problem List   Diagnosis Date Noted  . Rash 03/01/2017  . Health maintenance examination 12/06/2016  . 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 Obesity, Class I, BMI 30-34.9 07/04/2016  . Low vitamin B12 level 01/01/2016  . Medicare annual wellness visit, subsequent 07/03/2015  . Advanced care planning/counseling discussion 07/03/2015  . Diet-controlled diabetes mellitus ([place]) 05/04/2015  . Angina pectoris ([place]) 05/04/2015  . Ex-smoker 05/04/2015  . Recurrent sinusitis 08/28/2014  . Benign localized hyperplasia of prostate with urinary obstruction and other lower urinary tract symptoms (LUTS)(600.21) 04/02/2013  . 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 Spermatocele 04/02/2013  . Syncopal vertigo 11/04/2010  . Lumbar disc disease with radiculopathy 11/04/2010  . INGUINAL HERNIA, RIGHT 05/26/2010  . 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 PVD (peripheral vascular disease) with claudication ([place]) 08/11/2009  . 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 Carotid stenosis 07/09/2008  . Elevated prostate specific antigen (PSA) 07/09/2008  . 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 Essential hypertension 04/30/2007  . GERD 04/30/2007  . Hyperlipidemia 04/26/2007  . OA (osteoarthritis) of  knee 04/26/2007  . DVT, HX OF 04/26/2007    Blythe Stanford, PT DPT 04/24/2017, 12:01 PM  [facility] [address], [place], [address] Phone: [phone]   Fax:  [phone]  Name: [name] MRN: [id number] Date of Birth: September 08, 1940

## 2017-04-25 DIAGNOSIS — L821 Other seborrheic keratosis: Secondary | ICD-10-CM | POA: Diagnosis not present

## 2017-04-25 DIAGNOSIS — L570 Actinic keratosis: Secondary | ICD-10-CM | POA: Diagnosis not present

## 2017-04-25 DIAGNOSIS — D692 Other nonthrombocytopenic purpura: Secondary | ICD-10-CM | POA: Diagnosis not present

## 2017-04-25 DIAGNOSIS — L578 Other skin changes due to chronic exposure to nonionizing radiation: Secondary | ICD-10-CM | POA: Diagnosis not present

## 2017-04-25 DIAGNOSIS — L820 Inflamed seborrheic keratosis: Secondary | ICD-10-CM | POA: Diagnosis not present

## 2017-04-27 ENCOUNTER — Ambulatory Visit: Payer: PPO

## 2017-04-27 DIAGNOSIS — G8929 Other chronic pain: Secondary | ICD-10-CM

## 2017-04-27 DIAGNOSIS — M25561 Pain in right knee: Principal | ICD-10-CM

## 2017-04-27 DIAGNOSIS — M6281 Muscle weakness (generalized): Secondary | ICD-10-CM

## 2017-04-27 DIAGNOSIS — R2681 Unsteadiness on feet: Secondary | ICD-10-CM

## 2017-04-27 NOTE — Therapy (Signed)
[facility] November 28, [address], [place], [address] Phone: [phone]   Fax:  [phone]  Physical Therapy Evaluation  Patient Details  Name: [name] MRN: [id number] Date of Birth: [birth year] Referring Provider: Dr. Wynelle Link  Encounter Date: 04/27/2017      PT End of Session - 04/27/17 1151    Visit Number 10   Number of Visits 13   Date for PT Re-Evaluation 05-27-2017   Authorization Type g codes 10 /10   PT Start Time November 28, 1116   PT Stop Time 1200   PT Time Calculation (min) 42 min   Equipment Utilized During Treatment Gait belt   Activity Tolerance Patient tolerated treatment well   Behavior During Therapy [facility] for tasks assessed/performed      Past Medical History:  Diagnosis Date  . BENIGN PROSTATIC HYPERTROPHY, WITH URINARY OBSTRUCTION 09/06/2007  . 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 Chronic prostatitis 2008/05/27  . Diabetes mellitus without complication ([place])    diet controled per dr note  . 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 DVT, HX OF 1998  . GERD 04/30/2007  . Headache    hx migraines  . History of hiatal hernia   . History of shingles   . HYPERLIPIDEMIA 04/26/2007  . HYPERTENSION 04/30/2007  . INGUINAL HERNIA, RIGHT 05/26/2010  . LOC OSTEOARTHROS NOT SPEC PRIM/SEC LOWER LEG 11/05/2009  . Lumbar disc disease with radiculopathy   . Myocardial infarction ([place]) 1989  . OSTEOARTHRITIS 04/26/2007  . 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 Pneumonia 07/2014   [facility] hospitalization  . PSA, INCREASED 07/09/2008  . Spinal stenosis of lumbar region     Past Surgical History:  Procedure Laterality Date  . APPENDECTOMY     rupture  . CARDIAC CATHETERIZATION [name] 07/07/2015   Procedure: Left Heart Cath and Cors/Grafts Angiography;  Surgeon: [name], MD;  Location: [facility];  Service: Cardiovascular;   Laterality: [name];  . CATARACT EXTRACTION, BILATERAL    . COLONOSCOPY  03/2010   HP polyp, diverticulosis, rpt 10 yrs ([name])  . CORONARY ARTERY BYPASS GRAFT  1990   3 vessel   . CYSTOSCOPY  12/23/10   Cope  . KNEE ARTHROSCOPY Right    x2  . LAMINOTOMY  1986   L5/S1 lumbar laminotomy for two ruptured discs/fusion  . LUMBAR LAMINECTOMY/DECOMPRESSION MICRODISCECTOMY [name] 07/13/2016   Procedure: LUMBAR TWO-THREE, LUMBAR THREE-FOUR, LUMBAR FOUR-FIVE LAMINECTOMY AND FORAMINOTOMY;  Surgeon: [name], MD;  Location: [facility];  Service: Neurosurgery;  Laterality: [name];  LAMINECTOMY AND FORAMINOTOMY L2-L3, L3-L4,L4-L5  . TOTAL HIP ARTHROPLASTY Bilateral 11/28/1997  . TOTAL KNEE ARTHROPLASTY Right 03/06/2017   Procedure: RIGHT TOTAL KNEE ARTHROPLASTY;  Surgeon: Gaynelle Arabian, MD;  [facility];  Service: Orthopedics;  Laterality: Right;    There were no vitals filed for this visit.       Subjective Assessment - 04/27/17 1147    Subjective Patient reports his knee has been feeling a lot better over the past 2 days. Patient states he had an easier time sleeping over the past two nights    Pertinent History Pt underwent R TKR 03/06/17 by Dr. Wynelle Link. No reported major post-op complications. He has had HH PT following surgery. Pt was performing supine, seated, and standing exercises with therapist. Pt reports that he was able to 98 degrees of R knee flexion and is still lacking extension but is unable  to recall how much he is lacking. He has a history of lumbar laminectomy and decompression surgery 06/2016 (lumbar stenosis) and he describes what sounds like ACDF in May 2018 (cervical stenosis). History of R shoulder bursitis which started after surgery. Pt received and cortisone injection and pain has resolved almost entirely. Pt reports deconditioning since prior to back and neck surgeries. Other significant PMH includes CABG and bilateral hip THR (posterior approach)   Limitations Walking   Patient  Stated Goals Pt would like to improve his strength and endurance for working outside   Currently in Pain? No/denies   Pain Onset 1 to 4 weeks ago      Objective measurements completed on examination: See above findings.    TREATMENT: Manual Therapy: Scar tissue massage to the scar most specifically over the patella to decrease scar tissue adhesions. Grade III Patellar mobilizations- x 30 sec in each direction with patellar mobilizer. STM to patient's hamstrings on the affected side to decreased spasms and decrease pain.    Therapeutic Exercise: Single leg heel raises in standing - 3 x 10 B Total gym level 15 single leg - x 8, level 11- 2 x 10 B; B LE's level 26 x15 Reaching behind with B LE - 2 x 10  Sit to stands with L LE positioned in front - 2 x 10  Side stepping up and over on Bosu ball - 2 x 10  Rotational step ups onto the bosu ball - x 15 Standing leg circles with RTB - 2 x 15    Patient demonstrates increased LE at end of the session.        PT Education - 04/27/17 1151    Education provided Yes   Education Details form/technique with exercise   Person(s) Educated Patient   Methods Explanation;Demonstration   Comprehension Verbalized understanding;Returned demonstration          PT Short Term Goals - 04/27/17 1139      PT SHORT TERM GOAL #1   Title Pt will be independent with HEP in order to improve strength and balance in order to decrease fall risk and improve function at home and work.    Baseline Independent   Time 2   Period Weeks   Status Achieved           PT Long Term Goals - 04/27/17 1130      PT LONG TERM GOAL #1   Title Pt will increase LEFS by at least 9 points in order to demonstrate significant improvement in lower extremity function.    Baseline 03/27/17: 14/80   Time 6   Period Weeks   Status New     PT LONG TERM GOAL #2   Title Pt will improve R hip abduction/adduction strength by at least 1/2 MMT grade in order to improve R knee  stability and ability to ambulate on uneven surfaces   Baseline 03/27/17: R hip abduction/adduction both 4-/5; 04/27/17: hip abd/add 4/5   Time 6   Period Weeks   Status On-going     PT LONG TERM GOAL #3   Title Pt will report worst R knee pain as no more than 7/10 in order to improve sleep and daily function with less pain   Baseline 03/27/17: worst 10/10; 04/27/17: worst 4/10   Time 6   Period Weeks   Status Achieved     PT LONG TERM GOAL #4   Title Pt will decrease 5TSTS by at least 3 seconds in order to  demonstrate clinically significant improvement in LE strength    Baseline 03/27/17: 16.7 seconds; May 17, 2017: 11.8 sec    Time 6   Period Weeks   Status Achieved     PT LONG TERM GOAL #5   Title Pt will improve R knee AAROM to -5 to 110 degrees in order to normalize gait pattern and improve ability to ascend/descend stairs   Baseline 03/27/17: -15 to 98 degrees AAROM; -7 to 110 deg AAROM   Time 6   Period Weeks   Status On-going                Plan - May 17, 2017 1203    Clinical Impression Statement Patient demonstrates improvement in AROM, LE strength, and LE function as indicated by improved ability to perform 5xSTS and improvement in AROM measurements. Although patient is improving, he continues to demonstrate decreased muscular coordination, propioception, and strength (most notably in the quads). Patient will benfit from further skilled therapy to return to prior level of function.    Rehab Potential Good   PT Frequency 2x / week   PT Duration 6 weeks   PT Treatment/Interventions ADLs/Self Care Home Management;Aquatic Therapy;Cryotherapy;Electrical Stimulation;Iontophoresis 4mg /ml Dexamethasone;Moist Heat;Ultrasound;DME Instruction;Gait training;Stair training;Functional mobility training;Therapeutic activities;Therapeutic exercise;Balance training;Neuromuscular re-education;Patient/family education;Manual techniques;Dry needling   PT Next Visit Plan Educate pt about proper  stretching techniques for extension/flexion, consolidate HEP   PT Home Exercise Plan Standing heel raises, standing marches, standing squats, sit to stand (see instruction section)   Consulted and Agree with Plan of Care Patient      Patient will benefit from skilled therapeutic intervention in order to improve the following deficits and impairments:  Abnormal gait, Decreased balance, Difficulty walking, Decreased range of motion, Pain, Decreased strength  Visit Diagnosis: Chronic pain of right knee  Muscle weakness (generalized)  Unsteadiness on feet      G-Codes - 17-May-2017 11/30/05    Functional Assessment Tool Used (Outpatient Only) TUG, 5TSTS, 42m gait speed, LEFS, clinical judgement, ROM, MMT   Functional Limitation Mobility: Walking and moving around   Mobility: Walking and Moving Around Current Status [phone]) At least 20 percent but less than 40 percent impaired, limited or restricted   Mobility: Walking and Moving Around Goal Status [phone]) At least 1 percent but less than 20 percent impaired, limited or restricted       Problem List Patient Active Problem List   Diagnosis Date Noted  . Rash 03/01/2017  . Health maintenance examination 12/06/2016  . 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 Obesity, Class I, BMI 30-34.9 07/04/2016  . Low vitamin B12 level 01/01/2016  . Medicare annual wellness visit, subsequent 07/03/2015  . Advanced care planning/counseling discussion 07/03/2015  . Diet-controlled diabetes mellitus ([place]) 05/04/2015  . Angina pectoris ([place]) 05/04/2015  . Ex-smoker 05/04/2015  . Recurrent sinusitis 08/28/2014  . Benign localized hyperplasia of prostate with urinary obstruction and other lower urinary tract symptoms (LUTS)(600.21) 04/02/2013  . 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 Spermatocele 04/02/2013  . Syncopal vertigo 11/04/2010  . Lumbar disc disease with radiculopathy 11/04/2010  . INGUINAL HERNIA, RIGHT 05/26/2010  .  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 PVD (peripheral vascular disease) with claudication ([place]) 08/11/2009  . 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 Carotid stenosis 07/09/2008  . Elevated prostate specific antigen (PSA) 07/09/2008  . 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 Essential hypertension 04/30/2007  . GERD 04/30/2007  . Hyperlipidemia 04/26/2007  . OA (osteoarthritis) of knee  04/26/2007  . DVT, HX OF 04/26/2007    Blythe Stanford, PT DPT 04/27/2017, 12:07 PM  [facility] [address], [place], [address] Phone: [phone]   Fax:  [phone]  Name: [name] MRN: [id number] Date of Birth: 1941-03-20

## 2017-04-30 NOTE — Progress Notes (Signed)
Cardiology Office Note  Date:  05/01/2017   ID:  [name], DOB 11/06/1940, MRN [id number]  PCP:  Ria Bush, MD   Chief Complaint  Patient presents with  . other    6 month follow up. Patient denies chest pain and SOB. Meds reviewed verbally with patient.     HPI:  76 year old gentleman with hx of coronary artery disease,  bypass in 1990,  stenting to the circumflex in 2002,  DVT in the left lower extremity,  hyperlipidemia who presents for routine followup Of his coronary artery disease.   In follow-up today we discussed his recent surgeries He had no cardiac issues during surgery or in recovery per the patient  Completed back surgery for spinal stenosis, 06/20/2016 Cervical neck surgery 01/2017, [name]   total knee surgery in 02/2017  Still Doing PT Down 8 pounds in past few months Eating less following surgery Walking with a cane Denies having any significant chest pain on exertion  Lab work reviewed with him in detail HBA1C 6.5, down from 7.2 Total CHOL 141, LDL 80  EKG on today's visit shows normal sinus rhythm with rate 79 bpm, left axis deviation, no significant ST or T wave change  Other past medical history AAA less than 3 cm,  mild bilateral carotid arterial disease estimated at 40-50% bilaterally. Numerous previous episode of chest pain relieved with belching consistent with GI etiology. diagnosis of obstructive sleep apnea. Could not handle CPAP despite numerous attempts. Symptoms better after weight loss total hip replacements x2  Previous cardiac catheterization 07/07/2015  Ost LM to LM lesion, 50% stenosed.  Ost RCA lesion, 80% stenosed.  Prox RCA-1 lesion, 80% stenosed.  Prox RCA-2 lesion, 80% stenosed.  LIMA was injected is moderate in size, and is anatomically normal.  Dist LAD lesion, 60% stenosed.  Ost LAD to Prox LAD lesion, 100% stenosed.   Mid LAD lesion, 60% stenosed.  The left ventricular systolic function is normal.  SVG was injected is normal in caliber, and is anatomically normal.  2nd Diag lesion, 50% stenosed.  severe RCA disease (small vessel) not amenable to PCI, moderate left main disease, moderate LAD and proximal diagonal disease. --Patent grafts x2 --No intervention performed, started on isosorbide 30 mg daily  went to the emergency room in [facility] 07/03/2013 with chest pain. Had a CT scan of his chest which was essentially benign. Nitroglycerin did not help his pain. He ruled out and was sent home.  Repeat episode of chest pain 08/05/2013 with stress test at that time showing no significant ischemia, ejection fraction 48%. It was felt his chest pain was atypical in nature. She feels it was from GI etiology given his negative stress test.  History of cold induced chest tightness/shortness of breath, asthma.   episode of chest pain 05/14/2013. He felt was indigestion. He took several nitroglycerin with no significant improvement in his symptoms. Symptoms were severe, felt similar to his previous MI. He got in his car and on the way, had a big burp. Symptoms seemed to resolve at that time. He continued on to the emergency room. There cardiac enzymes were negative x2, EKG unchanged. He reports having had prior chest pain episode relieved with belching in the past.   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  PSH:    Past Surgical History:  Procedure Laterality Date  . APPENDECTOMY     rupture  . CARDIAC CATHETERIZATION [name] 07/07/2015   Procedure: Left Heart Cath and Cors/Grafts Angiography;  Surgeon: [name], MD;  Location: [facility];  Service: Cardiovascular;  Laterality: [name];  . CATARACT EXTRACTION, BILATERAL    . COLONOSCOPY  03/2010   HP polyp, diverticulosis, rpt 10 yrs ([name])  . CORONARY ARTERY BYPASS GRAFT  1990   3 vessel   . CYSTOSCOPY  12/23/10   [facility]  . KNEE ARTHROSCOPY Right    x2  . LAMINOTOMY  1986   L5/S1 lumbar laminotomy for two ruptured discs/fusion  . LUMBAR LAMINECTOMY/DECOMPRESSION MICRODISCECTOMY [name] 07/13/2016   Procedure: LUMBAR TWO-THREE, LUMBAR THREE-FOUR, LUMBAR FOUR-FIVE LAMINECTOMY AND FORAMINOTOMY;  Surgeon: [name], MD;  Location: [place];  Service: Neurosurgery;  Laterality: [name];  LAMINECTOMY AND FORAMINOTOMY L2-L3, L3-L4,L4-L5  . TOTAL HIP ARTHROPLASTY Bilateral 1999,2000  . TOTAL KNEE ARTHROPLASTY Right 03/06/2017   Procedure: RIGHT TOTAL KNEE ARTHROPLASTY;  Surgeon: Gaynelle Arabian, MD;  Location: [facility];  Service: Orthopedics;  Laterality: Right;    Current Outpatient Prescriptions  Medication Sig Dispense Refill  . docusate sodium (COLACE) 100 MG capsule Take 1 capsule (100 mg total) by mouth 2 (two) times daily. 60 capsule 0  . ezetimibe (ZETIA) 10 MG tablet TAKE ONE TABLET BY MOUTH EVERY DAY 90 tablet 3  . finasteride (PROSCAR) 5 MG tablet Take 5 mg by mouth daily.      . isosorbide mononitrate (IMDUR) 30 MG 24 hr tablet TAKE 1 TABLET BY MOUTH DAILY 90 tablet 0  . methocarbamol (ROBAXIN) 500 MG tablet Take 1 tablet (500 mg total) by mouth  every 6 (six) hours as needed for muscle spasms. 80 tablet 0  . nitroGLYCERIN (NITROSTAT) 0.4 MG SL tablet Place 0.4 mg under the tongue every 5 (five) minutes as needed for chest pain.    . pantoprazole (PROTONIX) 40 MG tablet Take 40 mg by mouth daily.    . rivaroxaban (XARELTO) 10 MG TABS tablet Take 1 tablet (10 mg total) by mouth daily with breakfast. Take Xarelto for two and a half more weeks following discharge from the hospital, then discontinue Xarelto. Once the patient has completed the Xarelto, they may resume the 81 mg Aspirin. 19 tablet 0  . simvastatin (ZOCOR) 40 MG tablet TAKE ONE TABLET BY MOUTH EVERY DAY 90 tablet 3  . tolterodine (DETROL LA) 4 MG 24 hr capsule Take 4 mg by mouth daily.    Marland Kitchen triamcinolone ointment (KENALOG) 0.5 % Apply 1 application topically 2 (two) times daily. 30 g 0   No current facility-administered medications for this visit.      Allergies:   Morphine and Vioxx [rofecoxib]   Social History:  The patient  reports that he quit smoking about 28 years ago. His smoking use included Cigarettes. He has a 25.00 pack-year smoking history. He has never used smokeless tobacco. He reports that he does not drink alcohol or use drugs.   Family History:   family history includes Cancer in his other and sister; Diabetes in his brother and mother; Heart attack in  his mother; Heart disease in his father; Kidney disease in his father and sister; Stroke in his father and mother.    Review of Systems: Review of Systems  Constitutional: Negative.   Respiratory: Negative.   Cardiovascular: Negative.   Gastrointestinal: Negative.   Musculoskeletal: Positive for back pain and joint pain.       Right knee discomfort  Neurological: Negative.   Psychiatric/Behavioral: Negative.   All other systems reviewed and are negative.    PHYSICAL EXAM: VS:  BP 138/77 (BP Location: Left Arm, Patient Position: Sitting, Cuff Size: Normal)   Pulse 79   Ht 6' (1.829 m)   Wt 213 lb  (96.6 kg)   BMI 28.89 kg/m  , BMI Body mass index is 28.89 kg/m.  GEN: Well nourished, well developed, in no acute distress  HEENT: normal  Neck: no JVD, carotid bruits, or masses, well-healed incision cervical spine surgery, anterior approach Cardiac: RRR; no murmurs, rubs, or gallops,no edema  Respiratory:  clear to auscultation bilaterally, normal work of breathing GI: soft, nontender, nondistended, + BS MS: no deformity or atrophy , well-healed incision right knee Skin: warm and dry, no rash Neuro:  Strength and sensation are intact Psych: euthymic mood, full affect    Recent Labs: 02/27/2017: ALT 17 03/08/2017: BUN 23; Creatinine, Ser 1.07; Hemoglobin 12.0; Platelets 176; Potassium 4.3; Sodium 140    Lipid Panel Lab Results  Component Value Date   CHOL 141 11/29/2016   HDL 34.90 (L) 11/29/2016   LDLCALC 80 11/29/2016   TRIG 128.0 11/29/2016      Wt Readings from Last 3 Encounters:  05/01/17 213 lb (96.6 kg)  03/06/17 221 lb (100.2 kg)  03/01/17 221 lb (100.2 kg)       ASSESSMENT AND PLAN:  Mixed hyperlipidemia Weight down 8 pounds from prior clinic visit Cholesterol is at goal on the current lipid regimen. No changes to the medications were made.  Essential hypertension Blood pressure is well controlled on today's visit. No changes made to the medications. Likely improved with weight loss, stable  Atherosclerosis of coronary artery bypass graft of native heart with stable angina pectoris ([place]) Currently with no symptoms of angina. No further workup at this time. Continue current medication regimen. Tolerated several surgeries without complications Now back on low-dose aspirin  Bilateral carotid artery stenosis Mild bilateral carotid disease less than 39% bilaterally Stressed importance of aggressive diabetes and cholesterol control  Angina pectoris ([facility]) No sx, no further testing  Diet-controlled diabetes mellitus ([place]) Congratulated him on 8 pound  weight loss Numbers are stable, actually improved  Ex-smoker Nonsmoker  Obesity, Class I, BMI 30-34.9 We have encouraged continued careful diet management in an effort to lose weight. HBA1C is elevated  Knee also arthritis, s/p TKR Having pain in his feet, suggested he talk with Dr. [name] her primary care about Neurontin as needed   Disposition:   F/U  12 months   Total encounter time more than 25 minutes  Greater than 50% was spent in counseling and coordination of care with the patient    Orders Placed This Encounter  Procedures  . EKG 12-Lead     Signed, Esmond Plants, M.D., Ph.D. 05/01/2017  [place], [place]

## 2017-05-01 ENCOUNTER — Encounter: Payer: Self-pay | Admitting: Cardiovascular Disease

## 2017-05-01 ENCOUNTER — Ambulatory Visit (INDEPENDENT_AMBULATORY_CARE_PROVIDER_SITE_OTHER): Payer: PPO | Admitting: Cardiovascular Disease

## 2017-05-01 VITALS — BP 138/77 | HR 79 | Ht 72.0 in | Wt 213.0 lb

## 2017-05-01 DIAGNOSIS — I25708 Atherosclerosis of coronary artery bypass graft(s), unspecified, with other forms of angina pectoris: Secondary | ICD-10-CM

## 2017-05-01 DIAGNOSIS — E119 Type 2 diabetes mellitus without complications: Secondary | ICD-10-CM

## 2017-05-01 DIAGNOSIS — I739 Peripheral vascular disease, unspecified: Secondary | ICD-10-CM | POA: Diagnosis not present

## 2017-05-01 DIAGNOSIS — E782 Mixed hyperlipidemia: Secondary | ICD-10-CM

## 2017-05-01 DIAGNOSIS — I10 Essential (primary) hypertension: Secondary | ICD-10-CM | POA: Diagnosis not present

## 2017-05-01 DIAGNOSIS — I6523 Occlusion and stenosis of bilateral carotid arteries: Secondary | ICD-10-CM

## 2017-05-01 MED ORDER — NITROGLYCERIN 0.4 MG SL SUBL: 0.4000 mg | SUBLINGUAL_TABLET | SUBLINGUAL | 6 refills | Status: DC | PRN

## 2017-05-01 NOTE — Patient Instructions (Addendum)
Medication Instructions:   No medication changes made  Ask Dr. [name] or Dr. Darnell Level. about taking neurontin/gabapentin for pain in the feet at night  Labwork:  No new labs needed  Testing/Procedures:  No further testing at this time   Follow-Up: It was a pleasure seeing you in the office today. Please call us if you have new issues that need to be addressed before your next appt.  [phone]  Your physician wants you to follow-up in: 12 months.  You will receive a reminder letter in the mail two months in advance. If you don't receive a letter, please call our office to schedule the follow-up appointment.  If you need a refill on your cardiac medications before your next appointment, please call your pharmacy.

## 2017-05-02 ENCOUNTER — Ambulatory Visit: Payer: PPO

## 2017-05-02 DIAGNOSIS — M25561 Pain in right knee: Principal | ICD-10-CM

## 2017-05-02 DIAGNOSIS — R2681 Unsteadiness on feet: Secondary | ICD-10-CM

## 2017-05-02 DIAGNOSIS — M6281 Muscle weakness (generalized): Secondary | ICD-10-CM

## 2017-05-02 DIAGNOSIS — G8929 Other chronic pain: Secondary | ICD-10-CM

## 2017-05-02 NOTE — Therapy (Signed)
[facility] [id number], [place], [address] Phone: [phone]   Fax:  [phone]  Physical Therapy Treatment  Patient Details  Name: [name] MRN: [id number] Date of Birth: 11/15/1940 Referring Provider: Dr. Wynelle Link  Encounter Date: 05/02/2017      PT End of Session - 05/02/17 1154    Visit Number 11   Number of Visits 13   Date for PT Re-Evaluation June 01, 2017   Authorization Type g codes 1 11/15/2022   PT Start Time 12-04-14   PT Stop Time 1200   PT Time Calculation (min) 44 min   Equipment Utilized During Treatment Gait belt   Activity Tolerance Patient tolerated treatment well   Behavior During Therapy [facility] for tasks assessed/performed      Past Medical History:  Diagnosis Date  . BENIGN PROSTATIC HYPERTROPHY, WITH URINARY OBSTRUCTION 09/06/2007  . 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 Chronic prostatitis 2008/06/01  . Diabetes mellitus without complication ([place])    diet controled per dr note  . 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 DVT, HX OF 1998  . GERD 04/30/2007  . Headache    hx migraines  . History of hiatal hernia   . History of shingles   . HYPERLIPIDEMIA 04/26/2007  . HYPERTENSION 04/30/2007  . INGUINAL HERNIA, RIGHT 05/26/2010  . LOC OSTEOARTHROS NOT SPEC PRIM/SEC LOWER LEG 11/05/2009  . Lumbar disc disease with radiculopathy   . Myocardial infarction ([place]) 1989  . OSTEOARTHRITIS 04/26/2007  . 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 Pneumonia 07/2014   [facility] hospitalization  . PSA, INCREASED 07/09/2008  . Spinal stenosis of lumbar region     Past Surgical History:  Procedure Laterality Date  . APPENDECTOMY     rupture  . CARDIAC CATHETERIZATION [name] 07/07/2015   Procedure: Left Heart Cath and Cors/Grafts Angiography;  Surgeon: [name], MD;  Location: [place];  Service: Cardiovascular;   Laterality: [name];  . CATARACT EXTRACTION, BILATERAL    . COLONOSCOPY  03/2010   HP polyp, diverticulosis, rpt 10 yrs ([name])  . CORONARY ARTERY BYPASS GRAFT  1990   3 vessel   . CYSTOSCOPY  12/23/10   [facility]  . KNEE ARTHROSCOPY Right    x2  . LAMINOTOMY  1986   L5/S1 lumbar laminotomy for two ruptured discs/fusion  . LUMBAR LAMINECTOMY/DECOMPRESSION MICRODISCECTOMY [name] 07/13/2016   Procedure: LUMBAR TWO-THREE, LUMBAR THREE-FOUR, LUMBAR FOUR-FIVE LAMINECTOMY AND FORAMINOTOMY;  Surgeon: [name], MD;  Location: [facility];  Service: Neurosurgery;  Laterality: [name];  LAMINECTOMY AND FORAMINOTOMY L2-L3, L3-L4,L4-L5  . TOTAL HIP ARTHROPLASTY Bilateral December 03, 1997  . TOTAL KNEE ARTHROPLASTY Right 03/06/2017   Procedure: RIGHT TOTAL KNEE ARTHROPLASTY;  Surgeon: Gaynelle Arabian, MD;  [facility];  Service: Orthopedics;  Laterality: Right;    There were no vitals filed for this visit.      Subjective Assessment - 05/02/17 1142    Subjective Patient reports an aggravation in symptoms today. Patient report's he's having increased low back pain which started after the previous treatment session. Patient reports his knee woke him up last night from pain but he was able to go back to sleep.    Pertinent History Pt underwent R TKR 03/06/17 by Dr. Wynelle Link. No reported major post-op complications. He has had HH PT following surgery. Pt was performing supine, seated, and standing exercises with therapist. Pt reports that he was able to 98  degrees of R knee flexion and is still lacking extension but is unable to recall how much he is lacking. He has a history of lumbar laminectomy and decompression surgery 06/2016 (lumbar stenosis) and he describes what sounds like ACDF in May 2018 (cervical stenosis). History of R shoulder bursitis which started after surgery. Pt received and cortisone injection and pain has resolved almost entirely. Pt reports deconditioning since prior to back and neck surgeries. Other  significant PMH includes CABG and bilateral hip THR (posterior approach)   Limitations Walking   Patient Stated Goals Pt would like to improve his strength and endurance for working outside   Currently in Pain? Yes   Pain Score 4    Pain Location Knee   Pain Orientation Right   Pain Descriptors / Indicators Aching   Pain Type Surgical pain   Pain Onset 1 to 4 weeks ago   Pain Frequency Intermittent         TREATMENT: Manual Therapy: Scar tissue massage to the scar most specifically over the patella to decrease scar tissue adhesions. Grade III Patellar mobilizations- x 30 sec in each direction with patellar mobilizer. STM to patient's hamstrings on the affected side to decreased spasms and decrease pain.    Therapeutic Exercise: SLR in supine - 2 x 10 Standing knee flexion/ hip extension - x 20 Standing Squats with UE support - x20 Sitting on physioball leaning forward for knee flexion - x1.5 min  Single leg heel raises in standing - 3 x 10 B Single leg Rotations with 10# weight - 3 x 10 Seated prayer stretch - x 10, x 5 , 2 x 5  Sit to stand with TRX straps - 2 x 10      Patient demonstrates increased LE at end of the session.       PT Education - 05/02/17 1153    Education provided Yes   Education Details form/technique with exercise   Person(s) Educated Patient   Methods Explanation;Demonstration   Comprehension Verbalized understanding;Returned demonstration          PT Short Term Goals - 04/27/17 1139      PT SHORT TERM GOAL #1   Title Pt will be independent with HEP in order to improve strength and balance in order to decrease fall risk and improve function at home and work.    Baseline Independent   Time 2   Period Weeks   Status Achieved           PT Long Term Goals - 04/27/17 1130      PT LONG TERM GOAL #1   Title Pt will increase LEFS by at least 9 points in order to demonstrate significant improvement in lower extremity function.    Baseline  03/27/17: 14/80   Time 6   Period Weeks   Status New     PT LONG TERM GOAL #2   Title Pt will improve R hip abduction/adduction strength by at least 1/2 MMT grade in order to improve R knee stability and ability to ambulate on uneven surfaces   Baseline 03/27/17: R hip abduction/adduction both 4-/5; 04/27/17: hip abd/add 4/5   Time 6   Period Weeks   Status On-going     PT LONG TERM GOAL #3   Title Pt will report worst R knee pain as no more than 7/10 in order to improve sleep and daily function with less pain   Baseline 03/27/17: worst 10/10; 04/27/17: worst 4/10   Time 6  Period Weeks   Status Achieved     PT LONG TERM GOAL #4   Title Pt will decrease 5TSTS by at least 3 seconds in order to demonstrate clinically significant improvement in LE strength    Baseline 03/27/17: 16.7 seconds; 04/27/17: 11.8 sec    Time 6   Period Weeks   Status Achieved     PT LONG TERM GOAL #5   Title Pt will improve R knee AAROM to -5 to 110 degrees in order to normalize gait pattern and improve ability to ascend/descend stairs   Baseline 03/27/17: -15 to 98 degrees AAROM; -7 to 110 deg AAROM   Time 6   Period Weeks   Status On-going               Plan - 05/02/17 1158    Clinical Impression Statement Patient demonstrates increased lumbar forward flexion with movement and patient required frequent cueing maintaining an erect posture. Patient demonstrates no increase pain with performance of exercises and focused on improving lumbar mobility and LE strength with exercises. Patient will benefit from further skilled therapy focused on improving limitations to return to prior level of function.    Rehab Potential Good   PT Frequency 2x / week   PT Duration 6 weeks   PT Treatment/Interventions ADLs/Self Care Home Management;Aquatic Therapy;Cryotherapy;Electrical Stimulation;Iontophoresis 4mg /ml Dexamethasone;Moist Heat;Ultrasound;DME Instruction;Gait training;Stair training;Functional mobility  training;Therapeutic activities;Therapeutic exercise;Balance training;Neuromuscular re-education;Patient/family education;Manual techniques;Dry needling   PT Next Visit Plan Educate pt about proper stretching techniques for extension/flexion, consolidate HEP   PT Home Exercise Plan Standing heel raises, standing marches, standing squats, sit to stand (see instruction section)   Consulted and Agree with Plan of Care Patient      Patient will benefit from skilled therapeutic intervention in order to improve the following deficits and impairments:  Abnormal gait, Decreased balance, Difficulty walking, Decreased range of motion, Pain, Decreased strength  Visit Diagnosis: Chronic pain of right knee  Muscle weakness (generalized)  Unsteadiness on feet     Problem List Patient Active Problem List   Diagnosis Date Noted  . Rash 03/01/2017  . Health maintenance examination 12/06/2016  . 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 Obesity, Class I, BMI 30-34.9 07/04/2016  . Low vitamin B12 level 01/01/2016  . Medicare annual wellness visit, subsequent 07/03/2015  . Advanced care planning/counseling discussion 07/03/2015  . Diet-controlled diabetes mellitus ([place]) 05/04/2015  . Angina pectoris ([place]) 05/04/2015  . Ex-smoker 05/04/2015  . Recurrent sinusitis 08/28/2014  . Benign localized hyperplasia of prostate with urinary obstruction and other lower urinary tract symptoms (LUTS)(600.21) 04/02/2013  . 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 Spermatocele 04/02/2013  . Syncopal vertigo 11/04/2010  . Lumbar disc disease with radiculopathy 11/04/2010  . INGUINAL HERNIA, RIGHT 05/26/2010  . 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 PVD (peripheral vascular disease) with claudication ([place]) 08/11/2009  . 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 Carotid stenosis 07/09/2008  . Elevated prostate specific antigen (PSA) 07/09/2008  . 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 Benign  prostatic hyperplasia with urinary obstruction 09/06/2007  . Essential hypertension 04/30/2007  . GERD 04/30/2007  . Hyperlipidemia 04/26/2007  . OA (osteoarthritis) of knee 04/26/2007  . DVT, HX OF 04/26/2007    Blythe Stanford, PT DPT 05/02/2017, 12:52 PM  [facility] [address] [place], [address] Phone: [phone]   Fax:  [phone]  Name: [name] MRN: [id number] Date of Birth: [birth year]

## 2017-05-04 ENCOUNTER — Ambulatory Visit: Payer: PPO | Admitting: Physical Therapy

## 2017-05-04 ENCOUNTER — Encounter: Payer: Self-pay | Admitting: Physical Therapy

## 2017-05-04 DIAGNOSIS — R2681 Unsteadiness on feet: Secondary | ICD-10-CM

## 2017-05-04 DIAGNOSIS — M25561 Pain in right knee: Secondary | ICD-10-CM | POA: Diagnosis not present

## 2017-05-04 DIAGNOSIS — G8929 Other chronic pain: Secondary | ICD-10-CM

## 2017-05-04 DIAGNOSIS — M6281 Muscle weakness (generalized): Secondary | ICD-10-CM

## 2017-05-04 NOTE — Therapy (Signed)
[facility] November 13, [address] [place], [address] Phone: [phone]   Fax:  [phone]  Physical Therapy Treatment  Patient Details  Name: [name] MRN: [id number] Date of Birth: [birth year] Referring Provider: Dr. Wynelle Link  Encounter Date: 05/04/2017      PT End of Session - 05/04/17 1117    Visit Number 12   Number of Visits 13   Date for PT Re-Evaluation 05-12-17   Authorization Type g codes 09-27-2022   PT Start Time November 14, 1114   PT Stop Time 1201   PT Time Calculation (min) 45 min   Equipment Utilized During Treatment Gait belt   Activity Tolerance Patient tolerated treatment well   Behavior During Therapy [facility] for tasks assessed/performed      Past Medical History:  Diagnosis Date  . BENIGN PROSTATIC HYPERTROPHY, WITH URINARY OBSTRUCTION 09/06/2007  . 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 Chronic prostatitis May 12, 2008  . Diabetes mellitus without complication ([place])    diet controled per dr note  . 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 DVT, HX OF 1998  . GERD 04/30/2007  . Headache    hx migraines  . History of hiatal hernia   . History of shingles   . HYPERLIPIDEMIA 04/26/2007  . HYPERTENSION 04/30/2007  . INGUINAL HERNIA, RIGHT 05/26/2010  . LOC OSTEOARTHROS NOT SPEC PRIM/SEC LOWER LEG 11/05/2009  . Lumbar disc disease with radiculopathy   . Myocardial infarction ([place]) 1989  . OSTEOARTHRITIS 04/26/2007  . 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 Pneumonia 07/2014   [facility] hospitalization  . PSA, INCREASED 07/09/2008  . Spinal stenosis of lumbar region     Past Surgical History:  Procedure Laterality Date  . APPENDECTOMY     rupture  . CARDIAC CATHETERIZATION [name] 07/07/2015   Procedure: Left Heart Cath and Cors/Grafts Angiography;  Surgeon: [name], MD;  Location: [place];  Service: Cardiovascular;   Laterality: [name];  . CATARACT EXTRACTION, BILATERAL    . COLONOSCOPY  03/2010   HP polyp, diverticulosis, rpt 10 yrs ([name])  . CORONARY ARTERY BYPASS GRAFT  1990   3 vessel   . CYSTOSCOPY  12/23/10   Cope  . KNEE ARTHROSCOPY Right    x2  . LAMINOTOMY  1986   L5/S1 lumbar laminotomy for two ruptured discs/fusion  . LUMBAR LAMINECTOMY/DECOMPRESSION MICRODISCECTOMY [name] 07/13/2016   Procedure: LUMBAR TWO-THREE, LUMBAR THREE-FOUR, LUMBAR FOUR-FIVE LAMINECTOMY AND FORAMINOTOMY;  Surgeon: [name], MD;  Location: [place];  Service: Neurosurgery;  Laterality: [name];  LAMINECTOMY AND FORAMINOTOMY L2-L3, L3-L4,L4-L5  . TOTAL HIP ARTHROPLASTY Bilateral 1997/11/13  . TOTAL KNEE ARTHROPLASTY Right 03/06/2017   Procedure: RIGHT TOTAL KNEE ARTHROPLASTY;  Surgeon: Gaynelle Arabian, MD;  [facility];  Service: Orthopedics;  Laterality: Right;    There were no vitals filed for this visit.      Subjective Assessment - 05/04/17 1120    Subjective Pt reports his back is feeling better, he also has an appointment with his MD regarding his back.  His R knee is doing well.  Still swelling and stiff and is still using ice.  No new complaints or concerns.    Pertinent History Pt underwent R TKR 03/06/17 by Dr. Wynelle Link. No reported major post-op complications. He has had HH PT following surgery. Pt was performing supine, seated, and standing exercises with therapist. Pt reports that he was able to 98 degrees of  R knee flexion and is still lacking extension but is unable to recall how much he is lacking. He has a history of lumbar laminectomy and decompression surgery 06/2016 (lumbar stenosis) and he describes what sounds like ACDF in May 2018 (cervical stenosis). History of R shoulder bursitis which started after surgery. Pt received and cortisone injection and pain has resolved almost entirely. Pt reports deconditioning since prior to back and neck surgeries. Other significant PMH includes CABG and bilateral hip THR  (posterior approach)   Limitations Walking   Patient Stated Goals Pt would like to improve his strength and endurance for working outside   Currently in Pain? Yes   Pain Score 8    Pain Location Knee   Pain Orientation Right   Pain Descriptors / Indicators Tightness   Pain Type Surgical pain   Pain Onset More than a month ago   Multiple Pain Sites No      TREATMENT    Manual Therapy:  Scar tissue massage to the scar most specifically over the patella to decrease scar tissue adhesions.  Grade III Patellar mobilizations- x 30 sec in each direction with patellar mobilizer.  Gentle massage surrounding R knee to decrease swelling.    Therapeutic Exercise:  RLE Lunges on 2nd step with 10 second hold. x20 R calf stretch on step.  2x30 seconds.  Standing knee flexion with 2 second holds x30.   Backward walking to work on R knee extension x400 ft. Noted pt with dec R knee extension with this.  Standing R TKE with GTB x20 Standing Squats without UE support - x20  Sit<>stand from chair without use of UEs with airex pad under L foot.  3x10.  Single leg heel raises in standing - 2 x 10 B  SLS throwing ball at rebounder.  30 seconds x2 each LE with intermittent toe touch         PT Education - 05/04/17 1117    Education provided Yes   Education Details Exercise technique   Person(s) Educated Patient   Methods Explanation;Demonstration;Verbal cues   Comprehension Verbalized understanding;Returned demonstration;Verbal cues required;Need further instruction          PT Short Term Goals - 04/27/17 1139      PT SHORT TERM GOAL #1   Title Pt will be independent with HEP in order to improve strength and balance in order to decrease fall risk and improve function at home and work.    Baseline Independent   Time 2   Period Weeks   Status Achieved           PT Long Term Goals - 04/27/17 1130      PT LONG TERM GOAL #1   Title Pt will increase LEFS by at least 9 points in  order to demonstrate significant improvement in lower extremity function.    Baseline 03/27/17: 14/80   Time 6   Period Weeks   Status New     PT LONG TERM GOAL #2   Title Pt will improve R hip abduction/adduction strength by at least 1/2 MMT grade in order to improve R knee stability and ability to ambulate on uneven surfaces   Baseline 03/27/17: R hip abduction/adduction both 4-/5; 04/27/17: hip abd/add 4/5   Time 6   Period Weeks   Status On-going     PT LONG TERM GOAL #3   Title Pt will report worst R knee pain as no more than 7/10 in order to improve sleep and daily function  with less pain   Baseline 03/27/17: worst 10/10; 04/27/17: worst 4/10   Time 6   Period Weeks   Status Achieved     PT LONG TERM GOAL #4   Title Pt will decrease 5TSTS by at least 3 seconds in order to demonstrate clinically significant improvement in LE strength    Baseline 03/27/17: 16.7 seconds; 04/27/17: 11.8 sec    Time 6   Period Weeks   Status Achieved     PT LONG TERM GOAL #5   Title Pt will improve R knee AAROM to -5 to 110 degrees in order to normalize gait pattern and improve ability to ascend/descend stairs   Baseline 03/27/17: -15 to 98 degrees AAROM; -7 to 110 deg AAROM   Time 6   Period Weeks   Status On-going               Plan - 05/04/17 1132    Clinical Impression Statement Pt reported stiffness in R knee upon arrival, thus started session with stretching to this region with reported improvement.  Noted that pt demonstrates decreased R knee extension when ambulating backward and had pt perform R TKE.  Pt demonstrates fatigue with more challenging strengthening exercises.  He will benefit from continued skilled PT interventions for improved strength, ROM, and gait mechanics.    Rehab Potential Good   PT Frequency 2x / week   PT Duration 6 weeks   PT Treatment/Interventions ADLs/Self Care Home Management;Aquatic Therapy;Cryotherapy;Electrical Stimulation;Iontophoresis 4mg /ml  Dexamethasone;Moist Heat;Ultrasound;DME Instruction;Gait training;Stair training;Functional mobility training;Therapeutic activities;Therapeutic exercise;Balance training;Neuromuscular re-education;Patient/family education;Manual techniques;Dry needling   PT Next Visit Plan Educate pt about proper stretching techniques for extension/flexion, consolidate HEP   PT Home Exercise Plan Standing heel raises, standing marches, standing squats, sit to stand (see instruction section)   Consulted and Agree with Plan of Care Patient      Patient will benefit from skilled therapeutic intervention in order to improve the following deficits and impairments:  Abnormal gait, Decreased balance, Difficulty walking, Decreased range of motion, Pain, Decreased strength  Visit Diagnosis: Chronic pain of right knee  Muscle weakness (generalized)  Unsteadiness on feet     Problem List Patient Active Problem List   Diagnosis Date Noted  . Rash 03/01/2017  . Health maintenance examination 12/06/2016  . 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 Obesity, Class I, BMI 30-34.9 07/04/2016  . Low vitamin B12 level 01/01/2016  . Medicare annual wellness visit, subsequent 07/03/2015  . Advanced care planning/counseling discussion 07/03/2015  . Diet-controlled diabetes mellitus ([place]) 05/04/2015  . Angina pectoris ([place]) 05/04/2015  . Ex-smoker 05/04/2015  . Recurrent sinusitis 08/28/2014  . Benign localized hyperplasia of prostate with urinary obstruction and other lower urinary tract symptoms (LUTS)(600.21) 04/02/2013  . 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 Spermatocele 04/02/2013  . Syncopal vertigo 11/04/2010  . Lumbar disc disease with radiculopathy 11/04/2010  . INGUINAL HERNIA, RIGHT 05/26/2010  . 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 PVD (peripheral vascular disease) with claudication ([place]) 08/11/2009  . 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 Carotid stenosis 07/09/2008   . Elevated prostate specific antigen (PSA) 07/09/2008  . 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 Essential hypertension 04/30/2007  . GERD 04/30/2007  . Hyperlipidemia 04/26/2007  . OA (osteoarthritis) of knee 04/26/2007  . DVT, HX OF 04/26/2007    Collie Siad PT, DPT 05/04/2017, 12:03 PM  [facility] [address]  [place], [place], [address] Phone: [phone]   Fax:  [phone]  Name: DEMARCO BACCI MRN: [id number] Date of Birth: Dec 03, 1940

## 2017-05-05 DIAGNOSIS — R030 Elevated blood-pressure reading, without diagnosis of hypertension: Secondary | ICD-10-CM | POA: Diagnosis not present

## 2017-05-05 DIAGNOSIS — Z6829 Body mass index (BMI) 29.0-29.9, adult: Secondary | ICD-10-CM | POA: Diagnosis not present

## 2017-05-05 DIAGNOSIS — M4712 Other spondylosis with myelopathy, cervical region: Secondary | ICD-10-CM | POA: Diagnosis not present

## 2017-05-08 ENCOUNTER — Ambulatory Visit: Payer: PPO

## 2017-05-08 DIAGNOSIS — G8929 Other chronic pain: Secondary | ICD-10-CM

## 2017-05-08 DIAGNOSIS — M25561 Pain in right knee: Principal | ICD-10-CM

## 2017-05-08 DIAGNOSIS — R2681 Unsteadiness on feet: Secondary | ICD-10-CM

## 2017-05-08 DIAGNOSIS — M6281 Muscle weakness (generalized): Secondary | ICD-10-CM

## 2017-05-08 NOTE — Therapy (Signed)
[place] [address] [place], [address] Phone: [phone]   Fax:  [phone]  Physical Therapy Treatment  Patient Details  Name: [name] MRN: [id number] Date of Birth: 09-Dec-1940 Referring Provider: Dr. Wynelle Link  Encounter Date: 05/08/2017      PT End of Session - 05/08/17 1140    Visit Number 13   Number of Visits 13   Date for PT Re-Evaluation May 23, 2017   Authorization Type g codes 3 11/06/2022   PT Start Time 11/25/15   PT Stop Time 1200   PT Time Calculation (min) 43 min   Equipment Utilized During Treatment Gait belt   Activity Tolerance Patient tolerated treatment well   Behavior During Therapy [facility] for tasks assessed/performed      Past Medical History:  Diagnosis Date  . BENIGN PROSTATIC HYPERTROPHY, WITH URINARY OBSTRUCTION 09/06/2007  . 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 Chronic prostatitis 23-May-2008  . Diabetes mellitus without complication ([place])    diet controled per dr note  . 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 DVT, HX OF 1998  . GERD 04/30/2007  . Headache    hx migraines  . History of hiatal hernia   . History of shingles   . HYPERLIPIDEMIA 04/26/2007  . HYPERTENSION 04/30/2007  . INGUINAL HERNIA, RIGHT 05/26/2010  . LOC OSTEOARTHROS NOT SPEC PRIM/SEC LOWER LEG 11/05/2009  . Lumbar disc disease with radiculopathy   . Myocardial infarction ([place]) 1989  . OSTEOARTHRITIS 04/26/2007  . 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 Pneumonia 07/2014   [facility] hospitalization  . PSA, INCREASED 07/09/2008  . Spinal stenosis of lumbar region     Past Surgical History:  Procedure Laterality Date  . APPENDECTOMY     rupture  . CARDIAC CATHETERIZATION [name] 07/07/2015   Procedure: Left Heart Cath and Cors/Grafts Angiography;  Surgeon: [name], MD;  Location: [facility];  Service: Cardiovascular;   Laterality: [name];  . CATARACT EXTRACTION, BILATERAL    . COLONOSCOPY  03/2010   HP polyp, diverticulosis, rpt 10 yrs ([name])  . CORONARY ARTERY BYPASS GRAFT  1990   3 vessel   . CYSTOSCOPY  12/23/10   Cope  . KNEE ARTHROSCOPY Right    x2  . LAMINOTOMY  1986   L5/S1 lumbar laminotomy for two ruptured discs/fusion  . LUMBAR LAMINECTOMY/DECOMPRESSION MICRODISCECTOMY [name] 07/13/2016   Procedure: LUMBAR TWO-THREE, LUMBAR THREE-FOUR, LUMBAR FOUR-FIVE LAMINECTOMY AND FORAMINOTOMY;  Surgeon: [name], MD;  Location: [place];  Service: Neurosurgery;  Laterality: [name];  LAMINECTOMY AND FORAMINOTOMY L2-L3, L3-L4,L4-L5  . TOTAL HIP ARTHROPLASTY Bilateral 11-24-97  . TOTAL KNEE ARTHROPLASTY Right 03/06/2017   Procedure: RIGHT TOTAL KNEE ARTHROPLASTY;  Surgeon: Gaynelle Arabian, MD;  [facility];  Service: Orthopedics;  Laterality: Right;    There were no vitals filed for this visit.      Subjective Assessment - 05/08/17 1132    Subjective Patient reports knee is not as swelling as much as the past. Patient reports he is concerned abount the "clicking" in his knee cap.    Pertinent History Pt underwent R TKR 03/06/17 by Dr. Wynelle Link. No reported major post-op complications. He has had HH PT following surgery. Pt was performing supine, seated, and standing exercises with therapist. Pt reports that he was able to 98 degrees of R knee flexion and is still lacking extension but is unable to recall how much he  is lacking. He has a history of lumbar laminectomy and decompression surgery 06/2016 (lumbar stenosis) and he describes what sounds like ACDF in May 2018 (cervical stenosis). History of R shoulder bursitis which started after surgery. Pt received and cortisone injection and pain has resolved almost entirely. Pt reports deconditioning since prior to back and neck surgeries. Other significant PMH includes CABG and bilateral hip THR (posterior approach)   Limitations Walking   Patient Stated Goals Pt  would like to improve his strength and endurance for working outside   Currently in Pain? No/denies   Pain Onset More than a month ago         TREATMENT      Manual Therapy:  Scar tissue massage to the scar most specifically over the patella to decrease scar tissue adhesions.  Grade III Patellar mobilizations- x 30 sec in each direction with patellar mobilizer.  Gentle massage surrounding R knee to decrease swelling.   Therapeutic Exercise:  Squats with physioball behind back - 2 x 25  Standing heel raises single leg - 3 x 10  Heel walking forward/backward - x 7 down and back ~65ft Heel taps out laterally forward backward - 2 x10  Step ups onto 2nd step - x15 B Cone taps in standing off of blue airex - x 25 B without UE support Knee flexion stretching onto the 2nd step - 6 x 5 sec holds on the R LE SLS throwing ball at [employer].  30 seconds x2 each LE with intermittent toe touch Patient demonstrates increased LE fatigue at end of the session          PT Education - 05/08/17 1136    Education provided Yes   Education Details technique/form with exercise   Person(s) Educated Patient   Methods Explanation;Demonstration   Comprehension Verbalized understanding;Returned demonstration          PT Short Term Goals - 04/27/17 1139      PT SHORT TERM GOAL #1   Title Pt will be independent with HEP in order to improve strength and balance in order to decrease fall risk and improve function at home and work.    Baseline Independent   Time 2   Period Weeks   Status Achieved           PT Long Term Goals - 04/27/17 1130      PT LONG TERM GOAL #1   Title Pt will increase LEFS by at least 9 points in order to demonstrate significant improvement in lower extremity function.    Baseline 03/27/17: 14/80   Time 6   Period Weeks   Status New     PT LONG TERM GOAL #2   Title Pt will improve R hip abduction/adduction strength by at least 1/2 MMT grade in order to improve R  knee stability and ability to ambulate on uneven surfaces   Baseline 03/27/17: R hip abduction/adduction both 4-/5; 04/27/17: hip abd/add 4/5   Time 6   Period Weeks   Status On-going     PT LONG TERM GOAL #3   Title Pt will report worst R knee pain as no more than 7/10 in order to improve sleep and daily function with less pain   Baseline 03/27/17: worst 10/10; 04/27/17: worst 4/10   Time 6   Period Weeks   Status Achieved     PT LONG TERM GOAL #4   Title Pt will decrease 5TSTS by at least 3 seconds in order to demonstrate clinically significant  improvement in LE strength    Baseline 03/27/17: 16.7 seconds; 04/27/17: 11.8 sec    Time 6   Period Weeks   Status Achieved     PT LONG TERM GOAL #5   Title Pt will improve R knee AAROM to -5 to 110 degrees in order to normalize gait pattern and improve ability to ascend/descend stairs   Baseline 03/27/17: -15 to 98 degrees AAROM; -7 to 110 deg AAROM   Time 6   Period Weeks   Status On-going               Plan - 05/08/17 1141    Clinical Impression Statement Pt demonstrates improvement with knee extension in standing with ability to perform exercises with greater AROM compared to previous visitation sessions. Although patient is improving he continues to demonstrates decreased LE weakness especially when performing concentric knee extension (such as performing step ups) in standing indicating decreased functional capacity. Patient will benefit from further skilled therapy to return to prior level of function.    Rehab Potential Good   PT Frequency 2x / week   PT Duration 6 weeks   PT Treatment/Interventions ADLs/Self Care Home Management;Aquatic Therapy;Cryotherapy;Electrical Stimulation;Iontophoresis 4mg /ml Dexamethasone;Moist Heat;Ultrasound;DME Instruction;Gait training;Stair training;Functional mobility training;Therapeutic activities;Therapeutic exercise;Balance training;Neuromuscular re-education;Patient/family education;Manual  techniques;Dry needling   PT Next Visit Plan Educate pt about proper stretching techniques for extension/flexion, consolidate HEP   PT Home Exercise Plan Standing heel raises, standing marches, standing squats, sit to stand (see instruction section)   Consulted and Agree with Plan of Care Patient      Patient will benefit from skilled therapeutic intervention in order to improve the following deficits and impairments:  Abnormal gait, Decreased balance, Difficulty walking, Decreased range of motion, Pain, Decreased strength  Visit Diagnosis: Chronic pain of right knee  Muscle weakness (generalized)  Unsteadiness on feet     Problem List Patient Active Problem List   Diagnosis Date Noted  . Rash 03/01/2017  . Health maintenance examination 12/06/2016  . 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 Obesity, Class I, BMI 30-34.9 07/04/2016  . Low vitamin B12 level 01/01/2016  . Medicare annual wellness visit, subsequent 07/03/2015  . Advanced care planning/counseling discussion 07/03/2015  . Diet-controlled diabetes mellitus ([place]) 05/04/2015  . Angina pectoris ([place]) 05/04/2015  . Ex-smoker 05/04/2015  . Recurrent sinusitis 08/28/2014  . Benign localized hyperplasia of prostate with urinary obstruction and other lower urinary tract symptoms (LUTS)(600.21) 04/02/2013  . 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 Spermatocele 04/02/2013  . Syncopal vertigo 11/04/2010  . Lumbar disc disease with radiculopathy 11/04/2010  . INGUINAL HERNIA, RIGHT 05/26/2010  . 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 PVD (peripheral vascular disease) with claudication ([place]) 08/11/2009  . 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 Carotid stenosis 07/09/2008  . Elevated prostate specific antigen (PSA) 07/09/2008  . 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 Essential hypertension 04/30/2007  . GERD 04/30/2007  .  Hyperlipidemia 04/26/2007  . OA (osteoarthritis) of knee 04/26/2007  . DVT, HX OF 04/26/2007    Blythe Stanford, PT DPT 05/08/2017, 11:59 AM  [facility] [address], [place], [address] Phone: [phone]   Fax:  [phone]  Name: [name] MRN: [id number] Date of Birth: 1940-09-07

## 2017-05-11 ENCOUNTER — Ambulatory Visit: Payer: PPO

## 2017-05-11 DIAGNOSIS — M25561 Pain in right knee: Secondary | ICD-10-CM | POA: Diagnosis not present

## 2017-05-11 DIAGNOSIS — R2681 Unsteadiness on feet: Secondary | ICD-10-CM

## 2017-05-11 DIAGNOSIS — M6281 Muscle weakness (generalized): Secondary | ICD-10-CM

## 2017-05-11 DIAGNOSIS — G8929 Other chronic pain: Secondary | ICD-10-CM

## 2017-05-11 NOTE — Therapy (Signed)
[facility] [address], [place], [address] Phone: [phone]   Fax:  [phone]  Physical Therapy Treatment  Patient Details  Name: [name] MRN: [id number] Date of Birth: 25-Oct-1940 Referring Provider: Dr. Wynelle Link  Encounter Date: 05/11/2017      PT End of Session - 05/11/17 1145    Visit Number 14   Number of Visits 22   Date for PT Re-Evaluation 2017-06-26   Authorization Type g codes 4 10-Nov-2022   PT Start Time 1113/11/28   PT Stop Time 1200   PT Time Calculation (min) 45 min   Equipment Utilized During Treatment Gait belt   Activity Tolerance Patient tolerated treatment well   Behavior During Therapy [facility] for tasks assessed/performed      Past Medical History:  Diagnosis Date  . BENIGN PROSTATIC HYPERTROPHY, WITH URINARY OBSTRUCTION 09/06/2007  . 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 Diabetes mellitus without complication ([place])    diet controled per dr note  . 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 DVT, HX OF 1998  . GERD 04/30/2007  . Headache    hx migraines  . History of hiatal hernia   . History of shingles   . HYPERLIPIDEMIA 04/26/2007  . HYPERTENSION 04/30/2007  . INGUINAL HERNIA, RIGHT 05/26/2010  . LOC OSTEOARTHROS NOT SPEC PRIM/SEC LOWER LEG 11/05/2009  . Lumbar disc disease with radiculopathy   . Myocardial infarction ([place]) 1989  . OSTEOARTHRITIS 04/26/2007  . 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 Pneumonia 07/2014   [facility] hospitalization  . PSA, INCREASED 07/09/2008  . Spinal stenosis of lumbar region     Past Surgical History:  Procedure Laterality Date  . APPENDECTOMY     rupture  . CARDIAC CATHETERIZATION [name] 07/07/2015   Procedure: Left Heart Cath and Cors/Grafts Angiography;  Surgeon: [name], MD;  Location: [place] [facility];  Service: Cardiovascular;   Laterality: [name];  . CATARACT EXTRACTION, BILATERAL    . COLONOSCOPY  03/2010   HP polyp, diverticulosis, rpt 10 yrs ([name])  . CORONARY ARTERY BYPASS GRAFT  1990   3 vessel   . CYSTOSCOPY  12/23/10   [facility]  . KNEE ARTHROSCOPY Right    x2  . LAMINOTOMY  1986   L5/S1 lumbar laminotomy for two ruptured discs/fusion  . LUMBAR LAMINECTOMY/DECOMPRESSION MICRODISCECTOMY [name] 07/13/2016   Procedure: LUMBAR TWO-THREE, LUMBAR THREE-FOUR, LUMBAR FOUR-FIVE LAMINECTOMY AND FORAMINOTOMY;  Surgeon: [name], MD;  Location: [place];  Service: Neurosurgery;  Laterality: [name];  LAMINECTOMY AND FORAMINOTOMY L2-L3, L3-L4,L4-L5  . TOTAL HIP ARTHROPLASTY Bilateral 28-Nov-1997  . TOTAL KNEE ARTHROPLASTY Right 03/06/2017   Procedure: RIGHT TOTAL KNEE ARTHROPLASTY;  Surgeon: Gaynelle Arabian, MD;  Location: [facility];  Service: Orthopedics;  Laterality: Right;    There were no vitals filed for this visit.      Subjective Assessment - 05/11/17 1139    Subjective Patient reports increased knee pain last night and the pain kept him up at night. Patient reports he only got 3 hours of sleep last night.    Pertinent History Pt underwent R TKR 03/06/17 by Dr. Wynelle Link. No reported major post-op complications. He has had HH PT following surgery. Pt was performing supine, seated, and standing exercises with therapist. Pt reports that he was able to 98 degrees of R knee flexion and is still lacking extension but is unable to recall how  much he is lacking. He has a history of lumbar laminectomy and decompression surgery 06/2016 (lumbar stenosis) and he describes what sounds like ACDF in May 2018 (cervical stenosis). History of R shoulder bursitis which started after surgery. Pt received and cortisone injection and pain has resolved almost entirely. Pt reports deconditioning since prior to back and neck surgeries. Other significant PMH includes CABG and bilateral hip THR (posterior approach)   Limitations Walking   Patient Stated Goals  Pt would like to improve his strength and endurance for working outside   Currently in Pain? Yes   Pain Score 5    Pain Location Knee   Pain Orientation Right   Pain Descriptors / Indicators Tightness   Pain Type Surgical pain   Pain Onset More than a month ago        Therapeutic Exercise:  Single leg deadlift in standing - 2 x 10 B B LE deadlift without UE support - 2 x 10  Lunges in standing - 2 x 10  Straight leg stretching with pushing down on knee - x 20  Seated pushing down on leg with OP from therapist - x 10  Single leg Heel raises - x 20 B Deadlift hip extension in standing - x 20 Patient demonstrates increased LE fatigue at end of the session        PT Education - 05/11/17 1145    Education provided Yes   Education Details form/technique with exercise   Person(s) Educated Patient   Methods Explanation;Demonstration   Comprehension Verbalized understanding;Returned demonstration          PT Short Term Goals - 04/27/17 1139      PT SHORT TERM GOAL #1   Title Pt will be independent with HEP in order to improve strength and balance in order to decrease fall risk and improve function at home and work.    Baseline Independent   Time 2   Period Weeks   Status Achieved           PT Long Term Goals - 05/11/17 1148      PT LONG TERM GOAL #1   Title Pt will increase LEFS by at least 9 points in order to demonstrate significant improvement in lower extremity function.    Baseline 03/27/17: 14/80; 05/11/2017: 46/80   Time 6   Period Weeks   Status On-going     PT LONG TERM GOAL #2   Title Pt will improve R hip abduction/adduction strength by at least 1/2 MMT grade in order to improve R knee stability and ability to ambulate on uneven surfaces   Baseline 03/27/17: R hip abduction/adduction both 4-/5; 04/27/17: hip abd/add 4/5   Time 6   Period Weeks   Status On-going     PT LONG TERM GOAL #3   Title Pt will report worst R knee pain as no more than 7/10 in  order to improve sleep and daily function with less pain   Baseline 03/27/17: worst 10/10; 04/27/17: worst 4/10   Time 6   Period Weeks   Status Achieved     PT LONG TERM GOAL #4   Title Pt will decrease 5TSTS by at least 3 seconds in order to demonstrate clinically significant improvement in LE strength    Baseline 03/27/17: 16.7 seconds; 04/27/17: 11.8 sec    Time 6   Period Weeks   Status Achieved     PT LONG TERM GOAL #5   Title Pt will improve R knee AAROM  to -5 to 110 degrees in order to normalize gait pattern and improve ability to ascend/descend stairs   Baseline 03/27/17: -15 to 98 degrees AAROM; -7 to 110 deg AAROM   Time 6   Period Weeks   Status On-going               Plan - 05/11/17 1238    Clinical Impression Statement Pt is making progress towards long term goals with an improvement in LEFS score (increased to 46/80 from 14/80), AROM, and strength measurements. Although patient is making progress, he continues to demonstrate decreased knee AROM and strength when walking/standing. Patient will benefit from further skilled therapy focused on improving limitations to return to prior level of function.    Rehab Potential Good   PT Frequency 2x / week   PT Duration 6 weeks   PT Treatment/Interventions ADLs/Self Care Home Management;Aquatic Therapy;Cryotherapy;Electrical Stimulation;Iontophoresis 4mg /ml Dexamethasone;Moist Heat;Ultrasound;DME Instruction;Gait training;Stair training;Functional mobility training;Therapeutic activities;Therapeutic exercise;Balance training;Neuromuscular re-education;Patient/family education;Manual techniques;Dry needling   PT Next Visit Plan Educate pt about proper stretching techniques for extension/flexion, consolidate HEP   PT Home Exercise Plan Standing heel raises, standing marches, standing squats, sit to stand (see instruction section)   Consulted and Agree with Plan of Care Patient      Patient will benefit from skilled therapeutic  intervention in order to improve the following deficits and impairments:  Abnormal gait, Decreased balance, Difficulty walking, Decreased range of motion, Pain, Decreased strength  Visit Diagnosis: Chronic pain of right knee  Muscle weakness (generalized)  Unsteadiness on feet     Problem List Patient Active Problem List   Diagnosis Date Noted  . Rash 03/01/2017  . Health maintenance examination 12/06/2016  . 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 Obesity, Class I, BMI 30-34.9 07/04/2016  . Low vitamin B12 level 01/01/2016  . Medicare annual wellness visit, subsequent 07/03/2015  . Advanced care planning/counseling discussion 07/03/2015  . Diet-controlled diabetes mellitus ([place]) 05/04/2015  . Angina pectoris ([name]) 05/04/2015  . Ex-smoker 05/04/2015  . Recurrent sinusitis 08/28/2014  . Benign localized hyperplasia of prostate with urinary obstruction and other lower urinary tract symptoms (LUTS)(600.21) 04/02/2013  . 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 Spermatocele 04/02/2013  . Syncopal vertigo 11/04/2010  . Lumbar disc disease with radiculopathy 11/04/2010  . INGUINAL HERNIA, RIGHT 05/26/2010  . 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 PVD (peripheral vascular disease) with claudication ([place]) 08/11/2009  . 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 Carotid stenosis 07/09/2008  . Elevated prostate specific antigen (PSA) 07/09/2008  . 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 Essential hypertension 04/30/2007  . GERD 04/30/2007  . Hyperlipidemia 04/26/2007  . OA (osteoarthritis) of knee 04/26/2007  . DVT, HX OF 04/26/2007    Blythe Stanford, PT DPT 05/11/2017, 12:57 PM  [facility] [address] [place], [address] Phone: [phone]   Fax:  [phone]  Name: [name] MRN: [id number] Date of Birth:  16-Feb-1941

## 2017-05-11 NOTE — Addendum Note (Signed)
Addended by: Blain Pais on: 05/11/2017 01:02 PM   Modules accepted: Orders

## 2017-05-16 ENCOUNTER — Ambulatory Visit: Payer: PPO

## 2017-05-17 ENCOUNTER — Ambulatory Visit: Payer: PPO | Attending: Orthopedic Surgery

## 2017-05-17 DIAGNOSIS — M6281 Muscle weakness (generalized): Secondary | ICD-10-CM | POA: Diagnosis not present

## 2017-05-17 DIAGNOSIS — M25561 Pain in right knee: Secondary | ICD-10-CM | POA: Insufficient documentation

## 2017-05-17 DIAGNOSIS — G8929 Other chronic pain: Secondary | ICD-10-CM | POA: Diagnosis not present

## 2017-05-17 DIAGNOSIS — R2681 Unsteadiness on feet: Secondary | ICD-10-CM

## 2017-05-17 NOTE — Therapy (Signed)
[place] [facility] [address] [place], [address] Phone: [phone]   Fax:  [phone]  Physical Therapy Treatment  Patient Details  Name: [name] MRN: [id number] Date of Birth: 1941-05-22 Referring Provider: Dr. Wynelle Link  Encounter Date: 05/17/2017      PT End of Session - 05/17/17 1700    Visit Number 15   Number of Visits 22   Date for PT Re-Evaluation June 28, 2017   Authorization Type g codes 5/10   PT Start Time 1300   PT Stop Time 1345   PT Time Calculation (min) 45 min   Equipment Utilized During Treatment Gait belt   Activity Tolerance Patient tolerated treatment well   Behavior During Therapy [facility] for tasks assessed/performed      Past Medical History:  Diagnosis Date  . BENIGN PROSTATIC HYPERTROPHY, WITH URINARY OBSTRUCTION 09/06/2007  . 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 Diabetes mellitus without complication ([place])    diet controled per dr note  . 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 DVT, HX OF 1998  . GERD 04/30/2007  . Headache    hx migraines  . History of hiatal hernia   . History of shingles   . HYPERLIPIDEMIA 04/26/2007  . HYPERTENSION 04/30/2007  . INGUINAL HERNIA, RIGHT 05/26/2010  . LOC OSTEOARTHROS NOT SPEC PRIM/SEC LOWER LEG 11/05/2009  . Lumbar disc disease with radiculopathy   . Myocardial infarction ([place]) 1989  . OSTEOARTHRITIS 04/26/2007  . 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 Pneumonia 07/2014   [facility] hospitalization  . PSA, INCREASED 07/09/2008  . Spinal stenosis of lumbar region     Past Surgical History:  Procedure Laterality Date  . APPENDECTOMY     rupture  . CARDIAC CATHETERIZATION [name] 07/07/2015   Procedure: Left Heart Cath and Cors/Grafts Angiography;  Surgeon: [name], MD;  Location: [place] [facility];  Service: Cardiovascular;   Laterality: [name];  . CATARACT EXTRACTION, BILATERAL    . COLONOSCOPY  03/2010   HP polyp, diverticulosis, rpt 10 yrs ([name])  . CORONARY ARTERY BYPASS GRAFT  1990   3 vessel   . CYSTOSCOPY  12/23/10   Cope  . KNEE ARTHROSCOPY Right    x2  . LAMINOTOMY  1986   L5/S1 lumbar laminotomy for two ruptured discs/fusion  . LUMBAR LAMINECTOMY/DECOMPRESSION MICRODISCECTOMY [name] 07/13/2016   Procedure: LUMBAR TWO-THREE, LUMBAR THREE-FOUR, LUMBAR FOUR-FIVE LAMINECTOMY AND FORAMINOTOMY;  Surgeon: [name], MD;  Location: [facility];  Service: Neurosurgery;  Laterality: [name];  LAMINECTOMY AND FORAMINOTOMY L2-L3, L3-L4,L4-L5  . TOTAL HIP ARTHROPLASTY Bilateral 1999,2000  . TOTAL KNEE ARTHROPLASTY Right 03/06/2017   Procedure: RIGHT TOTAL KNEE ARTHROPLASTY;  Surgeon: Gaynelle Arabian, MD;  [facility];  Service: Orthopedics;  Laterality: Right;    There were no vitals filed for this visit.      Subjective Assessment - 05/17/17 1659    Subjective Patient reports he had a large 'pop' when performing a squatting motion at home and states the knee has felt better ever since. Patient states he feels like his knee is improving.    Pertinent History Pt underwent R TKR 03/06/17 by Dr. Wynelle Link. No reported major post-op complications. He has had HH PT following surgery. Pt was performing supine, seated, and standing exercises with therapist. Pt reports that he was able to 98 degrees of R knee flexion and is still lacking extension but  is unable to recall how much he is lacking. He has a history of lumbar laminectomy and decompression surgery 06/2016 (lumbar stenosis) and he describes what sounds like ACDF in May 2018 (cervical stenosis). History of R shoulder bursitis which started after surgery. Pt received and cortisone injection and pain has resolved almost entirely. Pt reports deconditioning since prior to back and neck surgeries. Other significant PMH includes CABG and bilateral hip THR (posterior approach)    Limitations Walking   Patient Stated Goals Pt would like to improve his strength and endurance for working outside   Currently in Pain? No/denies   Pain Onset More than a month ago      TREATMENT Therapeutic Exercise Heel raises off of a step -- 2 x 20 Step ups onto second step -- 2 x 10 B Single leg mini squat with UE support -- 2 x 10 B Hip abduction, extension sliders with sliding piece -- 2 x 20 B Hip abduction machine - 3 x 20 40# Side stepping with bosu ball up and over -- 2 x 20 B Straight step up with high knee -- 2 x 20 B  Standing feet together on blue side of the bosu ball -- 1.16min x 2   Patient demonstrates increased fatigue at the end of therapy.        PT Education - 05/17/17 1700    Education provided Yes   Education Details form/technique with exercise   Person(s) Educated Patient   Methods Demonstration;Explanation   Comprehension Verbalized understanding;Returned demonstration          PT Short Term Goals - 04/27/17 1139      PT SHORT TERM GOAL #1   Title Pt will be independent with HEP in order to improve strength and balance in order to decrease fall risk and improve function at home and work.    Baseline Independent   Time 2   Period Weeks   Status Achieved           PT Long Term Goals - 05/11/17 1148      PT LONG TERM GOAL #1   Title Pt will increase LEFS by at least 9 points in order to demonstrate significant improvement in lower extremity function.    Baseline 03/27/17: 14/80; 05/11/2017: 46/80   Time 6   Period Weeks   Status On-going     PT LONG TERM GOAL #2   Title Pt will improve R hip abduction/adduction strength by at least 1/2 MMT grade in order to improve R knee stability and ability to ambulate on uneven surfaces   Baseline 03/27/17: R hip abduction/adduction both 4-/5; 04/27/17: hip abd/add 4/5   Time 6   Period Weeks   Status On-going     PT LONG TERM GOAL #3   Title Pt will report worst R knee pain as no more than 7/10  in order to improve sleep and daily function with less pain   Baseline 03/27/17: worst 10/10; 04/27/17: worst 4/10   Time 6   Period Weeks   Status Achieved     PT LONG TERM GOAL #4   Title Pt will decrease 5TSTS by at least 3 seconds in order to demonstrate clinically significant improvement in LE strength    Baseline 03/27/17: 16.7 seconds; 04/27/17: 11.8 sec    Time 6   Period Weeks   Status Achieved     PT LONG TERM GOAL #5   Title Pt will improve R knee AAROM to -5 to 110  degrees in order to normalize gait pattern and improve ability to ascend/descend stairs   Baseline 03/27/17: -15 to 98 degrees AAROM; -7 to 110 deg AAROM   Time 6   Period Weeks   Status On-going               Plan - 05/17/17 1701    Clinical Impression Statement Patient demonstrates improvement with standing based exercises with ability to perform greater amount of exercises before onset of fatigue. Although patient is improving, he continues to demonstrates limitations in knee extension AROM and difficulty with bending the knee in single leg weight bearing positions. Patient will benefit from further skilled therapy to return to prior level of function.    Rehab Potential Good   PT Frequency 2x / week   PT Duration 6 weeks   PT Treatment/Interventions ADLs/Self Care Home Management;Aquatic Therapy;Cryotherapy;Electrical Stimulation;Iontophoresis 4mg /ml Dexamethasone;Moist Heat;Ultrasound;DME Instruction;Gait training;Stair training;Functional mobility training;Therapeutic activities;Therapeutic exercise;Balance training;Neuromuscular re-education;Patient/family education;Manual techniques;Dry needling   PT Next Visit Plan Educate pt about proper stretching techniques for extension/flexion, consolidate HEP   PT Home Exercise Plan Standing heel raises, standing marches, standing squats, sit to stand (see instruction section)   Consulted and Agree with Plan of Care Patient      Patient will benefit from  skilled therapeutic intervention in order to improve the following deficits and impairments:  Abnormal gait, Decreased balance, Difficulty walking, Decreased range of motion, Pain, Decreased strength  Visit Diagnosis: Chronic pain of right knee  Muscle weakness (generalized)  Unsteadiness on feet     Problem List Patient Active Problem List   Diagnosis Date Noted  . Rash 03/01/2017  . Health maintenance examination 12/06/2016  . 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 Obesity, Class I, BMI 30-34.9 07/04/2016  . Low vitamin B12 level 01/01/2016  . Medicare annual wellness visit, subsequent 07/03/2015  . Advanced care planning/counseling discussion 07/03/2015  . Diet-controlled diabetes mellitus ([place]) 05/04/2015  . Angina pectoris ([place]) 05/04/2015  . Ex-smoker 05/04/2015  . Recurrent sinusitis 08/28/2014  . Benign localized hyperplasia of prostate with urinary obstruction and other lower urinary tract symptoms (LUTS)(600.21) 04/02/2013  . 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 Spermatocele 04/02/2013  . Syncopal vertigo 11/04/2010  . Lumbar disc disease with radiculopathy 11/04/2010  . INGUINAL HERNIA, RIGHT 05/26/2010  . 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 PVD (peripheral vascular disease) with claudication ([place]) 08/11/2009  . 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 Carotid stenosis 07/09/2008  . Elevated prostate specific antigen (PSA) 07/09/2008  . 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 Essential hypertension 04/30/2007  . GERD 04/30/2007  . Hyperlipidemia 04/26/2007  . OA (osteoarthritis) of knee 04/26/2007  . DVT, HX OF 04/26/2007    Blythe Stanford, PT DPT 05/17/2017, 5:03 PM  [facility] [facility] [address], [place], [address] Phone: [phone]   Fax:  [phone]  Name: [name] MRN:  [id number] Date of Birth: [birth year]

## 2017-05-23 ENCOUNTER — Ambulatory Visit: Payer: PPO

## 2017-05-23 DIAGNOSIS — M25561 Pain in right knee: Secondary | ICD-10-CM | POA: Diagnosis not present

## 2017-05-23 DIAGNOSIS — R2681 Unsteadiness on feet: Secondary | ICD-10-CM

## 2017-05-23 DIAGNOSIS — M6281 Muscle weakness (generalized): Secondary | ICD-10-CM

## 2017-05-23 DIAGNOSIS — G8929 Other chronic pain: Secondary | ICD-10-CM

## 2017-05-23 NOTE — Therapy (Signed)
[facility] December 09, [address], [place], [address] Phone: [phone]   Fax:  [phone]  Physical Therapy Treatment  Patient Details  Name: [name] MRN: [id number] Date of Birth: April 06, 1941 Referring Provider: Dr. Wynelle Link  Encounter Date: 05/23/2017      PT End of Session - 05/23/17 1140    Visit Number 16   Number of Visits 22   Date for PT Re-Evaluation 2017-07-07   Authorization Type g codes 6/10   PT Start Time 10-Dec-1115   PT Stop Time 1200   PT Time Calculation (min) 43 min   Equipment Utilized During Treatment Gait belt   Activity Tolerance Patient tolerated treatment well   Behavior During Therapy [facility] for tasks assessed/performed      Past Medical History:  Diagnosis Date  . BENIGN PROSTATIC HYPERTROPHY, WITH URINARY OBSTRUCTION 09/06/2007  . 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 Diabetes mellitus without complication ([place])    diet controled per dr note  . 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 DVT, HX OF 1998  . GERD 04/30/2007  . Headache    hx migraines  . History of hiatal hernia   . History of shingles   . HYPERLIPIDEMIA 04/26/2007  . HYPERTENSION 04/30/2007  . INGUINAL HERNIA, RIGHT 05/26/2010  . LOC OSTEOARTHROS NOT SPEC PRIM/SEC LOWER LEG 11/05/2009  . Lumbar disc disease with radiculopathy   . Myocardial infarction ([place]) 1989  . OSTEOARTHRITIS 04/26/2007  . 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 Pneumonia 07/2014   [facility] hospitalization  . PSA, INCREASED 07/09/2008  . Spinal stenosis of lumbar region     Past Surgical History:  Procedure Laterality Date  . APPENDECTOMY     rupture  . CARDIAC CATHETERIZATION [name] 07/07/2015   Procedure: Left Heart Cath and Cors/Grafts Angiography;  Surgeon: [name], MD;  Location: [place] [facility];  Service: Cardiovascular;   Laterality: [name];  . CATARACT EXTRACTION, BILATERAL    . COLONOSCOPY  03/2010   HP polyp, diverticulosis, rpt 10 yrs ([name])  . CORONARY ARTERY BYPASS GRAFT  1990   3 vessel   . CYSTOSCOPY  12/23/10   [facility]  . KNEE ARTHROSCOPY Right    x2  . LAMINOTOMY  1986   L5/S1 lumbar laminotomy for two ruptured discs/fusion  . LUMBAR LAMINECTOMY/DECOMPRESSION MICRODISCECTOMY [name] 07/13/2016   Procedure: LUMBAR TWO-THREE, LUMBAR THREE-FOUR, LUMBAR FOUR-FIVE LAMINECTOMY AND FORAMINOTOMY;  Surgeon: [name], MD;  Location: [facility];  Service: Neurosurgery;  Laterality: [name];  LAMINECTOMY AND FORAMINOTOMY L2-L3, L3-L4,L4-L5  . TOTAL HIP ARTHROPLASTY Bilateral 09-Dec-1997  . TOTAL KNEE ARTHROPLASTY Right 03/06/2017   Procedure: RIGHT TOTAL KNEE ARTHROPLASTY;  Surgeon: Gaynelle Arabian, MD;  Location: [facility];  Service: Orthopedics;  Laterality: Right;    There were no vitals filed for this visit.      Subjective Assessment - 05/23/17 1119    Subjective Patient reports the knee has been waking him up in the middle of the night. Patient reports he had his knee "double back" and has been hurting ever since. Patient reports his strength is improving, but contines to have limitations with straightening out his knee.    Pertinent History Pt underwent R TKR 03/06/17 by Dr. Wynelle Link. No reported major post-op complications. He has had HH PT following surgery. Pt was performing supine, seated, and standing exercises with therapist. Pt reports that he was able  to 98 degrees of R knee flexion and is still lacking extension but is unable to recall how much he is lacking. He has a history of lumbar laminectomy and decompression surgery 06/2016 (lumbar stenosis) and he describes what sounds like ACDF in May 2018 (cervical stenosis). History of R shoulder bursitis which started after surgery. Pt received and cortisone injection and pain has resolved almost entirely. Pt reports deconditioning since prior to back and neck surgeries.  Other significant PMH includes CABG and bilateral hip THR (posterior approach)   Limitations Walking   Patient Stated Goals Pt would like to improve his strength and endurance for working outside   Currently in Pain? No/denies   Pain Onset More than a month ago      TREATMENT Therapeutic Exercise Hip abduction machine - 3 x 20  55#; hip extension 100# 2x20 Step ups onto second step -- 2 x 10 B Heel walking down and back - 2 x 10 B Heel raises single leg off of step - 2 x 20 B  Step ups onto bosu ball - 3 x 20 with focus on improving knee extension on the R LE  Hip circles with GTB around with ankles - 2 x 20 B  Single leg mini squat with UE support (TRX)-- 2 x 10 B   Patient demonstrates increased fatigue at the end of therapy.        PT Education - 05/23/17 1139    Education provided Yes   Education Details form/technique with exercise   Person(s) Educated Patient   Methods Explanation;Demonstration   Comprehension Verbalized understanding;Returned demonstration          PT Short Term Goals - 04/27/17 1139      PT SHORT TERM GOAL #1   Title Pt will be independent with HEP in order to improve strength and balance in order to decrease fall risk and improve function at home and work.    Baseline Independent   Time 2   Period Weeks   Status Achieved           PT Long Term Goals - 05/11/17 1148      PT LONG TERM GOAL #1   Title Pt will increase LEFS by at least 9 points in order to demonstrate significant improvement in lower extremity function.    Baseline 03/27/17: 14/80; 05/11/2017: 46/80   Time 6   Period Weeks   Status On-going     PT LONG TERM GOAL #2   Title Pt will improve R hip abduction/adduction strength by at least 1/2 MMT grade in order to improve R knee stability and ability to ambulate on uneven surfaces   Baseline 03/27/17: R hip abduction/adduction both 4-/5; 04/27/17: hip abd/add 4/5   Time 6   Period Weeks   Status On-going     PT LONG TERM  GOAL #3   Title Pt will report worst R knee pain as no more than 7/10 in order to improve sleep and daily function with less pain   Baseline 03/27/17: worst 10/10; 04/27/17: worst 4/10   Time 6   Period Weeks   Status Achieved     PT LONG TERM GOAL #4   Title Pt will decrease 5TSTS by at least 3 seconds in order to demonstrate clinically significant improvement in LE strength    Baseline 03/27/17: 16.7 seconds; 04/27/17: 11.8 sec    Time 6   Period Weeks   Status Achieved     PT LONG TERM GOAL #5  Title Pt will improve R knee AAROM to -5 to 110 degrees in order to normalize gait pattern and improve ability to ascend/descend stairs   Baseline 03/27/17: -15 to 98 degrees AAROM; -7 to 110 deg AAROM   Time 6   Period Weeks   Status On-going               Plan - 05/23/17 1145    Clinical Impression Statement Patient continues to demonstrate decreased knee extension with exercises and focused today on improving knee extension and giving verbal cueing to improve knee extension. Patient demonstrates improved ability to perform single leg mini squat with UE support with TRX support cvompard to previous visits indicating functional carryover between sessions. Patient will benefit from further skilled therapy focused on improving knee extension AROM and strength to return to prior level of function.     Rehab Potential Good   PT Frequency 2x / week   PT Duration 6 weeks   PT Treatment/Interventions ADLs/Self Care Home Management;Aquatic Therapy;Cryotherapy;Electrical Stimulation;Iontophoresis 4mg /ml Dexamethasone;Moist Heat;Ultrasound;DME Instruction;Gait training;Stair training;Functional mobility training;Therapeutic activities;Therapeutic exercise;Balance training;Neuromuscular re-education;Patient/family education;Manual techniques;Dry needling   PT Next Visit Plan Educate pt about proper stretching techniques for extension/flexion, consolidate HEP   PT Home Exercise Plan Standing heel  raises, standing marches, standing squats, sit to stand (see instruction section)   Consulted and Agree with Plan of Care Patient      Patient will benefit from skilled therapeutic intervention in order to improve the following deficits and impairments:  Abnormal gait, Decreased balance, Difficulty walking, Decreased range of motion, Pain, Decreased strength  Visit Diagnosis: Chronic pain of right knee  Muscle weakness (generalized)  Unsteadiness on feet     Problem List Patient Active Problem List   Diagnosis Date Noted  . Rash 03/01/2017  . Health maintenance examination 12/06/2016  . 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 Obesity, Class I, BMI 30-34.9 07/04/2016  . Low vitamin B12 level 01/01/2016  . Medicare annual wellness visit, subsequent 07/03/2015  . Advanced care planning/counseling discussion 07/03/2015  . Diet-controlled diabetes mellitus ([place]) 05/04/2015  . Angina pectoris ([place]) 05/04/2015  . Ex-smoker 05/04/2015  . Recurrent sinusitis 08/28/2014  . Benign localized hyperplasia of prostate with urinary obstruction and other lower urinary tract symptoms (LUTS)(600.21) 04/02/2013  . 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 Spermatocele 04/02/2013  . Syncopal vertigo 11/04/2010  . Lumbar disc disease with radiculopathy 11/04/2010  . INGUINAL HERNIA, RIGHT 05/26/2010  . 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 PVD (peripheral vascular disease) with claudication ([place]) 08/11/2009  . 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 Carotid stenosis 07/09/2008  . Elevated prostate specific antigen (PSA) 07/09/2008  . 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 Essential hypertension 04/30/2007  . GERD 04/30/2007  . Hyperlipidemia 04/26/2007  . OA (osteoarthritis) of knee 04/26/2007  . DVT, HX OF 04/26/2007    Blythe Stanford, PT DPT 05/23/2017, 12:00 PM  [facility] [address], [place], [address] Phone: [phone]   Fax:  [phone]  Name: [name] MRN: [id number] Date of Birth: Jan 03, 1941

## 2017-05-25 ENCOUNTER — Ambulatory Visit: Payer: PPO

## 2017-05-29 ENCOUNTER — Encounter: Payer: Self-pay | Admitting: Family Medicine

## 2017-06-05 ENCOUNTER — Ambulatory Visit (INDEPENDENT_AMBULATORY_CARE_PROVIDER_SITE_OTHER)
Admission: RE | Admit: 2017-06-05 | Discharge: 2017-06-05 | Disposition: A | Discharge status: Home / Self Care | Payer: PPO | Source: Ambulatory Visit | Attending: Family Medicine | Admitting: Family Medicine

## 2017-06-05 ENCOUNTER — Encounter: Payer: Self-pay | Admitting: Family Medicine

## 2017-06-05 ENCOUNTER — Ambulatory Visit (INDEPENDENT_AMBULATORY_CARE_PROVIDER_SITE_OTHER): Payer: PPO | Admitting: Family Medicine

## 2017-06-05 VITALS — BP 120/66 | HR 86 | Temp 98.2°F | Wt 217.2 lb

## 2017-06-05 DIAGNOSIS — J181 Lobar pneumonia, unspecified organism: Secondary | ICD-10-CM

## 2017-06-05 DIAGNOSIS — E782 Mixed hyperlipidemia: Secondary | ICD-10-CM

## 2017-06-05 DIAGNOSIS — R600 Localized edema: Secondary | ICD-10-CM | POA: Insufficient documentation

## 2017-06-05 DIAGNOSIS — J189 Pneumonia, unspecified organism: Secondary | ICD-10-CM | POA: Insufficient documentation

## 2017-06-05 DIAGNOSIS — M4802 Spinal stenosis, cervical region: Secondary | ICD-10-CM | POA: Diagnosis not present

## 2017-06-05 DIAGNOSIS — E119 Type 2 diabetes mellitus without complications: Secondary | ICD-10-CM | POA: Diagnosis not present

## 2017-06-05 DIAGNOSIS — I739 Peripheral vascular disease, unspecified: Secondary | ICD-10-CM

## 2017-06-05 DIAGNOSIS — R062 Wheezing: Secondary | ICD-10-CM | POA: Diagnosis not present

## 2017-06-05 DIAGNOSIS — I10 Essential (primary) hypertension: Secondary | ICD-10-CM

## 2017-06-05 HISTORY — DX: Pneumonia, unspecified organism: J18.9

## 2017-06-05 LAB — BASIC METABOLIC PANEL
BUN: 17 mg/dL (ref 6–23)
CO2: 26 mEq/L (ref 19–32)
Calcium: 9.5 mg/dL (ref 8.4–10.5)
Chloride: 102 mEq/L (ref 96–112)
Creatinine, Ser: 1.05 mg/dL (ref 0.40–1.50)
GFR: 72.95 mL/min (ref >60.00)
Glucose, Bld: 111 mg/dL — ABNORMAL HIGH (ref 70–99)
Potassium: 4.1 mEq/L (ref 3.5–5.1)
Sodium: 138 mEq/L (ref 135–145)

## 2017-06-05 LAB — HEMOGLOBIN A1C: Hgb A1c MFr Bld: 6.5 % (ref 4.6–6.5)

## 2017-06-05 LAB — TSH: TSH: 4.06 u[IU]/mL (ref 0.35–4.50)

## 2017-06-05 MED ORDER — AZITHROMYCIN 250 MG PO TABS
ORAL_TABLET | ORAL | 0 refills | Status: DC
Start: 2017-06-05 — End: 2017-06-16

## 2017-06-05 MED ORDER — ALBUTEROL SULFATE HFA 108 (90 BASE) MCG/ACT IN AERS: 2.0000 | INHALATION_SPRAY | Freq: Four times a day (QID) | RESPIRATORY_TRACT | 0 refills | Status: DC | PRN

## 2017-06-05 MED ORDER — CEFTRIAXONE SODIUM 1 G IJ SOLR
1.0000 g | Freq: Once | INTRAMUSCULAR | Status: AC
  Administered 2017-06-05: 1 g via INTRAMUSCULAR
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
## 2017-06-05 NOTE — Assessment & Plan Note (Signed)
Chronic, stable. Continue current regimen. 

## 2017-06-05 NOTE — Assessment & Plan Note (Signed)
Update ABI with decreased pulses noted L foot.

## 2017-06-05 NOTE — Assessment & Plan Note (Signed)
Concern for this based on clinical exam. Treat with rocephin 1gm then zpack. CXR today. WASP for albuterol inhaler provided today. Pt agrees with plan.

## 2017-06-05 NOTE — Addendum Note (Signed)
Addended by: Brenton Grills on: 51/05/2110 10:14 AM   Modules accepted: Orders

## 2017-06-05 NOTE — Assessment & Plan Note (Signed)
Chronic, stable. Update labs.  

## 2017-06-05 NOTE — Assessment & Plan Note (Addendum)
Mild on exam today. Will continue to monitor. Check TSH, BMP today.  He is already on gabapentin for neuropathy

## 2017-06-05 NOTE — Assessment & Plan Note (Signed)
S/p surgery [name])

## 2017-06-05 NOTE — Patient Instructions (Addendum)
Shot of rocephin today. Then take zpack sent to pharmacy for lungs - bronchitis vs early pneumonia. If ongoing wheezing, fill albuterol inhaler printed prescription provided today. Push fluids and rest. Continue mucinex with plenty of water to mobilize mucous.  We will order arterial circulation evaluation - see Rosaria Ferries. Labs today.  Return in 3 months for follow up visit

## 2017-06-06 ENCOUNTER — Other Ambulatory Visit: Payer: Self-pay | Admitting: Family Medicine

## 2017-06-06 DIAGNOSIS — I739 Peripheral vascular disease, unspecified: Secondary | ICD-10-CM

## 2017-06-06 DIAGNOSIS — R0989 Other specified symptoms and signs involving the circulatory and respiratory systems: Secondary | ICD-10-CM

## 2017-06-07 ENCOUNTER — Ambulatory Visit: Payer: PPO | Admitting: Family Medicine

## 2017-06-07 ENCOUNTER — Ambulatory Visit: Payer: PPO

## 2017-06-16 ENCOUNTER — Ambulatory Visit (INDEPENDENT_AMBULATORY_CARE_PROVIDER_SITE_OTHER): Payer: PPO | Admitting: Family Medicine

## 2017-06-16 ENCOUNTER — Encounter: Payer: Self-pay | Admitting: Family Medicine

## 2017-06-16 DIAGNOSIS — J392 Other diseases of pharynx: Secondary | ICD-10-CM | POA: Diagnosis not present

## 2017-06-16 MED ORDER — PREDNISONE 10 MG PO TABS: ORAL_TABLET | ORAL | 0 refills | Status: DC

## 2017-06-16 NOTE — Progress Notes (Signed)
   Subjective:    Patient ID: [name], male    DOB: 05/25/1941, 76 y.o.   MRN: [id number]  HPI    76 year old former smoker pt of Dr. Synthia Innocent with history of  DM presents for  Re-eval of symptoms.   He was diagnosed on 10/22 with  CA PNA right lower lung.  Treated with rocephin and zpack, albuterol inhaler prn.  CXR: scarring and atelectasis LLL, no acute cardiopulmonary changes No hx of asthma or COPD  He reports he felt better overall. Improved, cough went  Away  5 days ago.   2 days ago.. went on hayride.Marland Kitchen Next morning.. Woke up with burning in chest and scratchy throat.  No fever,  mild SOB with exertion  Burning in chest.  Using mucinex for cough.. Does not help much. Did not fill albuterol.  Hx of  PNA in 2015   former smoker  Review of Systems  Constitutional: Negative for fatigue and fever.  HENT: Negative for ear pain.   Eyes: Negative for redness.  Respiratory: Positive for shortness of breath. Negative for wheezing.   Cardiovascular: Negative for chest pain, palpitations and leg swelling.  Gastrointestinal: Negative for abdominal distention.       Objective:   Physical Exam  Constitutional: Vital signs are normal. He appears well-developed and well-nourished.  Non-toxic appearance. He does not appear ill. No distress.  HENT:  Head: Normocephalic and atraumatic.  Right Ear: Hearing, tympanic membrane, external ear and ear canal normal. No tenderness. No foreign bodies. Tympanic membrane is not retracted and not bulging.  Left Ear: Hearing, tympanic membrane, external ear and ear canal normal. No tenderness. No foreign bodies. Tympanic membrane is not retracted and not bulging.  Nose: Nose normal. No mucosal edema or rhinorrhea. Right sinus exhibits no maxillary sinus tenderness and no frontal sinus tenderness. Left sinus exhibits no maxillary sinus tenderness and no frontal sinus tenderness.  Mouth/Throat: Uvula is midline and mucous membranes are normal.  Normal dentition. No dental caries. Posterior oropharyngeal erythema present. No oropharyngeal exudate or tonsillar abscesses.  Eyes: Pupils are equal, round, and reactive to light. Conjunctivae, EOM and lids are normal. Lids are everted and swept, no foreign bodies found.  Neck: Trachea normal, normal range of motion and phonation normal. Neck supple. Carotid bruit is not present. No thyroid mass and no thyromegaly present.  Cardiovascular: Normal rate, regular rhythm, S1 normal, S2 normal, normal heart sounds, intact distal pulses and normal pulses.  Exam reveals no gallop.   No murmur heard. Pulmonary/Chest: Effort normal and breath sounds normal. No respiratory distress. He has no wheezes. He has no rhonchi. He has no rales.  Rales heard earlier in week.. cleared  Abdominal: Soft. Normal appearance and bowel sounds are normal. There is no hepatosplenomegaly. There is no tenderness. There is no rebound, no guarding and no CVA tenderness. No hernia.  Neurological: He is alert. He has normal reflexes.  Skin: Skin is warm, dry and intact. No rash noted.  Psychiatric: He has a normal mood and affect. His speech is normal and behavior is normal. Judgment normal.          Assessment & Plan:

## 2017-06-16 NOTE — Assessment & Plan Note (Signed)
No sign of candida.   Appears most consistent with allergy response to hayride.[name]. IMproving .  tret with antihistamine.  Can use albuterol if bronchospasm related cough.   if not improving fill rx for low dos e prednione taper.   No sign of ongoing bacterial infection/CAPNA at this point.

## 2017-06-16 NOTE — Patient Instructions (Addendum)
Fill albuterol inhaler to use as needed for cough.  Start zyrtec at bedtime.  Continue mucinex DM.  If not improving.[name] a prescription for prednisone taper.  Call if new fever or increased shortness of breath.

## 2017-07-05 ENCOUNTER — Ambulatory Visit (INDEPENDENT_AMBULATORY_CARE_PROVIDER_SITE_OTHER): Payer: PPO

## 2017-07-05 DIAGNOSIS — R0989 Other specified symptoms and signs involving the circulatory and respiratory systems: Secondary | ICD-10-CM

## 2017-07-05 DIAGNOSIS — I739 Peripheral vascular disease, unspecified: Secondary | ICD-10-CM

## 2017-07-10 ENCOUNTER — Telehealth: Payer: Self-pay | Admitting: *Deleted

## 2017-07-10 NOTE — Telephone Encounter (Signed)
Reviewed results and recommendations with patient and he was agreeable with this plan. Advised that I would have scheduling give him a call to arrange appointment with a provider here in our office. He was appreciative for the call and had no further questions at this time.

## 2017-07-10 NOTE — Telephone Encounter (Signed)
-----   Message from [name], MD sent at 07/09/2017 11:28 AM EST ----- LE arterial doppler Would consider consultation with PV for further discussion of blockages

## 2017-07-13 ENCOUNTER — Encounter: Payer: Self-pay | Admitting: Cardiovascular Disease

## 2017-07-13 ENCOUNTER — Ambulatory Visit: Payer: PPO | Admitting: Cardiovascular Disease

## 2017-07-13 ENCOUNTER — Telehealth: Payer: Self-pay | Admitting: *Deleted

## 2017-07-13 VITALS — BP 120/60 | HR 70 | Ht 72.0 in | Wt 217.8 lb

## 2017-07-13 DIAGNOSIS — I739 Peripheral vascular disease, unspecified: Secondary | ICD-10-CM

## 2017-07-13 DIAGNOSIS — I10 Essential (primary) hypertension: Secondary | ICD-10-CM | POA: Diagnosis not present

## 2017-07-13 DIAGNOSIS — I25810 Atherosclerosis of coronary artery bypass graft(s) without angina pectoris: Secondary | ICD-10-CM

## 2017-07-13 DIAGNOSIS — E782 Mixed hyperlipidemia: Secondary | ICD-10-CM | POA: Diagnosis not present

## 2017-07-13 MED ORDER — RIVAROXABAN 2.5 MG PO TABS: 1.0000 | ORAL_TABLET | Freq: Two times a day (BID) | ORAL | 3 refills | Status: DC

## 2017-07-13 NOTE — Progress Notes (Signed)
Cardiology Office Note   Date:  07/13/2017   ID:  [name], DOB 1940-11-19, MRN [id number]  PCP:  Ria Bush, MD  Cardiologist:  Dr. Rockey Situ  Chief Complaint  Patient presents with  . OTHER    F/u lower extremity u/s. Meds reviewed verbally with pt.      History of Present Illness: [name] is a 76 y.o. male who was referred by Dr. Danise [name] for evaluation and management of peripheral arterial disease. The patient has known history of coronary artery disease status post CABG in 1990, PCI of the left circumflex in 2002, DVT in the left lower extremity, hyperlipidemia and back surgery for spinal stenosis in 2017.  He had hip replacement x2. 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  He reports chronic pain throughout including lower back, bilateral knees especially after right knee replacement about 4 months ago.  He also describes bilateral foot burning sensation mostly at night.  He also describes bilateral calf discomfort with walking. He has no lower extremity ulceration. He underwent arterial Doppler which showed noncompressible arteries on the right side with normal toe brachial index and biphasic waveform.  Left ABI was normal with biphasic waveforms.  Past Medical History:  Diagnosis Date  . BENIGN PROSTATIC HYPERTROPHY, WITH URINARY OBSTRUCTION 09/06/2007  . 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 Diabetes mellitus without complication ([place])    diet controled per dr note  . 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 DVT, HX OF 1998  . GERD 04/30/2007  . Headache    hx migraines  . History of hiatal hernia   . History of shingles   . HYPERLIPIDEMIA 04/26/2007  . HYPERTENSION 04/30/2007  . INGUINAL HERNIA, RIGHT 05/26/2010  . LOC OSTEOARTHROS NOT SPEC PRIM/SEC LOWER LEG 11/05/2009  . Lumbar disc disease  with radiculopathy   . Myocardial infarction ([place]) 1989  . OSTEOARTHRITIS 04/26/2007  . 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 Pneumonia 07/2014   [facility] hospitalization  . PSA, INCREASED 07/09/2008  . Spinal stenosis of lumbar region     Past Surgical History:  Procedure Laterality Date  . APPENDECTOMY     rupture  . CARDIAC CATHETERIZATION [name] 07/07/2015   Procedure: Left Heart Cath and Cors/Grafts Angiography;  Surgeon: [name] Merritts, MD;  Location: [place] [facility];  Service: Cardiovascular;  Laterality: [name];  . CATARACT EXTRACTION, BILATERAL    . CERVICAL SPINE SURGERY  12/2016   cervical stenosis [name])  . COLONOSCOPY  03/2010   HP polyp, diverticulosis, rpt 10 yrs ([name])  . CORONARY ARTERY BYPASS GRAFT  1990   3 vessel   . CYSTOSCOPY  12/23/10   [facility]  . KNEE ARTHROSCOPY Right    x2  . LAMINOTOMY  1986   L5/S1 lumbar laminotomy for two ruptured discs/fusion  . LUMBAR LAMINECTOMY/DECOMPRESSION MICRODISCECTOMY [name] 07/13/2016   Procedure: LUMBAR TWO-THREE, LUMBAR THREE-FOUR, LUMBAR FOUR-FIVE LAMINECTOMY AND FORAMINOTOMY;  Surgeon: [name], MD;  Location: [place];  Service: Neurosurgery;  Laterality: [name];  LAMINECTOMY AND FORAMINOTOMY L2-L3, L3-L4,L4-L5  . TOTAL HIP ARTHROPLASTY Bilateral 1999,2000  . TOTAL KNEE ARTHROPLASTY Right 03/06/2017   Procedure: RIGHT TOTAL KNEE ARTHROPLASTY;  Surgeon: [name] Arabian, MD;  Location: [facility];  Service: Orthopedics;  Laterality: Right;     Current Outpatient Medications  Medication Sig Dispense Refill  . albuterol (PROVENTIL HFA;VENTOLIN  HFA) 108 (90 Base) MCG/ACT inhaler Inhale 2 puffs into the lungs every 6 (six) hours as needed for wheezing or shortness of breath. 1 Inhaler 0  . aspirin EC 81 MG tablet Take 1 tablet (81 mg total) by mouth daily. 90 tablet 3  . docusate sodium (COLACE) 100 MG capsule Take 1 capsule (100 mg total) by mouth 2 (two) times daily. 60 capsule 0  . ezetimibe (ZETIA) 10 MG  tablet TAKE ONE TABLET BY MOUTH EVERY DAY 90 tablet 3  . finasteride (PROSCAR) 5 MG tablet Take 5 mg by mouth daily.      [name] gabapentin (NEURONTIN) 300 MG capsule Take 300 mg by mouth at bedtime.    . isosorbide mononitrate (IMDUR) 30 MG 24 hr tablet TAKE 1 TABLET BY MOUTH DAILY 90 tablet 0  . methocarbamol (ROBAXIN) 500 MG tablet Take 1 tablet (500 mg total) by mouth every 6 (six) hours as needed for muscle spasms. 80 tablet 0  . nitroGLYCERIN (NITROSTAT) 0.4 MG SL tablet Place 1 tablet (0.4 mg total) under the tongue every 5 (five) minutes as needed for chest pain. 25 tablet 6  . pantoprazole (PROTONIX) 40 MG tablet Take 40 mg by mouth daily.    . predniSONE (DELTASONE) 10 MG tablet 3 tabs by mouth daily x 3 days, then 2 tabs by mouth daily x 2 days then 1 tab by mouth daily x 2 days 15 tablet 0  . simvastatin (ZOCOR) 40 MG tablet TAKE ONE TABLET BY MOUTH EVERY DAY 90 tablet 3  . tolterodine (DETROL LA) 4 MG 24 hr capsule Take 4 mg by mouth daily.    [name] triamcinolone ointment (KENALOG) 0.5 % Apply 1 application topically 2 (two) times daily. 30 g 0   No current facility-administered medications for this visit.     Allergies:   Morphine and Vioxx [rofecoxib]    Social History:  The patient  reports that he quit smoking about 28 years ago. His smoking use included cigarettes. He has a 25.00 pack-year smoking history. he has never used smokeless tobacco. He reports that he does not drink alcohol or use drugs.   Family History:  The patient's family history includes Cancer in his other and sister; Diabetes in his brother and mother; Heart attack in his mother; Heart disease in his father; Kidney disease in his father and sister; Stroke in his father and mother.    ROS:  Please see the history of present illness.   Otherwise, review of systems are positive for none.   All other systems are reviewed and negative.    PHYSICAL EXAM: VS:  BP 120/60 (BP Location: Left Arm, Patient Position: Sitting,  Cuff Size: Normal)   Pulse 70   Ht 6' (1.829 m)   Wt 217 lb 12 oz (98.8 kg)   BMI 29.53 kg/m  , BMI Body mass index is 29.53 kg/m. GEN: Well nourished, well developed, in no acute distress  HEENT: normal  Neck: no JVD, carotid bruits, or masses Cardiac: RRR; no  rubs, or gallops,no edema .  1 out of 6 systolic ejection murmur in the aortic area Respiratory:  clear to auscultation bilaterally, normal work of breathing GI: soft, nontender, nondistended, + BS MS: no deformity or atrophy  Skin: warm and dry, no rash Neuro:  Strength and sensation are intact Psych: euthymic mood, full affect Vascular: Femoral pulses +1 on the right side with scarring related to previous cardiac catheterization, left femoral pulses normal.  Distal pulses are nonpalpable  EKG:  EKG is ordered today. The ekg ordered today demonstrates normal sinus rhythm with left axis deviation.   Recent Labs: 02/27/2017: ALT 17 03/08/2017: Hemoglobin 12.0; Platelets 176 06/05/2017: BUN 17; Creatinine, Ser 1.05; Potassium 4.1; Sodium 138; TSH 4.06    Lipid Panel    Component Value Date/Time   CHOL 141 11/29/2016 1025   TRIG 128.0 11/29/2016 1025   HDL 34.90 (L) 11/29/2016 1025   CHOLHDL 4 11/29/2016 1025   VLDL 25.6 11/29/2016 1025   LDLCALC 80 11/29/2016 1025   LDLDIRECT 106.0 01/01/2016 0844      Wt Readings from Last 3 Encounters:  07/13/17 217 lb 12 oz (98.8 kg)  06/16/17 217 lb 8 oz (98.7 kg)  06/05/17 217 lb 4 oz (98.5 kg)      No flowsheet data found.    ASSESSMENT AND PLAN:  1.  Peripheral arterial disease: The patient does have evidence of peripheral arterial disease based on abnormal distal pulses and slightly abnormal ABI.  However, I do not think his disease is critical. I also suspect that his lower leg discomfort is multifactorial related to osteoarthritis, peripheral neuropathy and peripheral arterial disease. I discussed with him the natural history and management of peripheral arterial  disease. I am going to obtain lower extremity arterial duplex. I advised him to increase physical activities and walking. There is also recent evidence showing long-term benefit of low-dose Xarelto in these type of patients.  I elected to start him on 2.5 mg twice daily in addition to aspirin 81 mg once daily.  2.  Coronary artery disease involving native coronary arteries without angina: Continue medical therapy.  3.  Hyperlipidemia: Currently on simvastatin and Zetia with most recent LDL of 80.  Consider switching to a more potent statin.    Disposition:   FU with me in 3 months  Signed,  Kathlyn Sacramento, MD  07/13/2017 10:54 AM    [place]

## 2017-07-13 NOTE — Patient Instructions (Addendum)
Medication Instructions:  Your physician has recommended you make the following change in your medication:  1- START taking Xarelto 2.5 mg by mouth two times a day.   Labwork: NONE  Testing/Procedures: Your physician has requested that you have a lower extremity arterial duplex. During this test, ultrasound is used to evaluate arterial blood flow in the legs. Allow one hour for this exam. There are no restrictions or special instructions.  Follow-Up: Your physician recommends that you schedule a follow-up appointment in: [place].    If you need a refill on your cardiac medications before your next appointment, please call your pharmacy.   Medication Samples have been provided to the patient.  Drug name: xARELTO       Strength: 2.5 MG        Qty: 2 BOTTLES  LOT: [id number]  Exp.Date: 03/2020

## 2017-07-13 NOTE — Telephone Encounter (Signed)
Prior authorization for Xarelto 2.5 mg, 1 tablet by mouth two times a day submitted via Covermymeds.com. Awaiting approval.

## 2017-07-15 LAB — HM DIABETES EYE EXAM

## 2017-08-02 ENCOUNTER — Telehealth: Payer: Self-pay

## 2017-08-02 ENCOUNTER — Ambulatory Visit (INDEPENDENT_AMBULATORY_CARE_PROVIDER_SITE_OTHER): Payer: PPO

## 2017-08-02 ENCOUNTER — Telehealth: Payer: Self-pay | Admitting: Cardiovascular Disease

## 2017-08-02 DIAGNOSIS — I10 Essential (primary) hypertension: Secondary | ICD-10-CM

## 2017-08-02 DIAGNOSIS — I739 Peripheral vascular disease, unspecified: Secondary | ICD-10-CM

## 2017-08-02 DIAGNOSIS — I25810 Atherosclerosis of coronary artery bypass graft(s) without angina pectoris: Secondary | ICD-10-CM

## 2017-08-02 LAB — VAS US LOWER EXTREMITY ARTERIAL DUPLEX
LEFT PERO MID DIA: 0 cm/s
LEFT PERO MID SYS: 27 cm/s
LEFT POPLITEAL DIST DYS VEL: 8 cm/s
LEFT POPLITEAL PROX DYS VEL: 0 cm/s
LEFT SFA DIST DYS VEL: 0 cm/s
LEFT SFA MID VEL: 0 cm/s
LEFT SFA PROX DYS VEL: 0 cm/s
Left ant tibial mid sys: 0 cm/s
Left ant tibial mid sys: 39 cm/s
Left popliteal dist sys PSV: 48 cm/s
Left popliteal prox sys PSV: 41 cm/s
Left post tibial mid dia: 4 cm/s
Left super femoral dist sys PSV: -261 cm/s
Left super femoral mid sys PSV: -132 cm/s
Left super femoral prox sys PSV: 106 cm/s
RIGHT ANT MID TIBIAL DIA PSV: -3 cm/s
RIGHT ANT MID TIBIAL SYS PSV: -42 cm/s
RIGHT PERO MID DIA: 0 cm/s
RIGHT PERO MID SYS: 38 cm/s
RIGHT POPLITEAL PROX EDV: 0 cm/s
RIGHT POST TIBIAL EDV: 0 cm/s
RIGHT SUPER FEMORAL DIST EDV: -12 cm/s
RIGHT SUPER FEMORAL MID EDV: -20 cm/s
Right popliteal prox sys PSV: 45 cm/s
Right post tibial sys PSV: 41 cm/s
Right super femoral dist sys PSV: -135 cm/s
Right super femoral mid sys PSV: -122 cm/s
Right super femoral prox sys PSV: 91 cm/s
left post tibial mid sys: 44 cm/s

## 2017-08-02 NOTE — Telephone Encounter (Signed)
Patient has voiced concern about his co-pay for Xarelto being $85/ 30 day supply. I called [facility] to try to initiate a Tier Exception. I was informed that since Xarelto was non-formulary, they do not grant tier exceptions. I spoke with the patient about assistance programs but he doesn't think he would qualify. Thinks he may need surgery for his PAD. Will wait and speak with Dr. [name] about this.

## 2017-08-02 NOTE — Telephone Encounter (Signed)
To Dr. Johney Maine to review.

## 2017-08-02 NOTE — Telephone Encounter (Signed)
Reiland, Connye Burkitt, LPN  Emily Filbert, RN        Beaulah Corin,  I spoke with Mr Vannote and told him I'd try to get him a tier exception on that Xarelto. We'll see what happens...Marland KitchenMarland Kitchen

## 2017-08-02 NOTE — Telephone Encounter (Signed)
I called and spoke with the patient. He was restarted on Xarelto 2.5 mg BID by Dr. [name] at his last office visit on 07/13/17. He called today stating that his co-pay with [employer] is $85/ month and he is having a hard time affording this. I advised the patient I will do some checking to see what we can try to do.  Staff message sent to Prior Auth dept in [place] to give feedback on alternatives for assistance such as tier exception.  Will await response from them then call the patient back.

## 2017-08-02 NOTE — Telephone Encounter (Signed)
Please call regarding pt assistance for Xarelto.

## 2017-08-03 DIAGNOSIS — Z96651 Presence of right artificial knee joint: Secondary | ICD-10-CM | POA: Diagnosis not present

## 2017-08-03 DIAGNOSIS — M1711 Unilateral primary osteoarthritis, right knee: Secondary | ICD-10-CM | POA: Diagnosis not present

## 2017-08-03 DIAGNOSIS — Z471 Aftercare following joint replacement surgery: Secondary | ICD-10-CM | POA: Diagnosis not present

## 2017-08-03 NOTE — Telephone Encounter (Signed)
Pt reports he has enough Xarelto until the first part of January. He called his insurance company and learned that starting 2019, xarelto will be a Tier 3 making his 90 day supply $90 which he feels is affordable. LEA yesterday, awaiting results and POC.

## 2017-08-03 NOTE — Telephone Encounter (Signed)
Ok. See result note.

## 2017-08-13 DIAGNOSIS — H43813 Vitreous degeneration, bilateral: Secondary | ICD-10-CM | POA: Diagnosis not present

## 2017-08-17 ENCOUNTER — Telehealth: Payer: Self-pay | Admitting: Cardiovascular Disease

## 2017-08-17 ENCOUNTER — Other Ambulatory Visit: Payer: Self-pay | Admitting: Cardiovascular Disease

## 2017-08-17 NOTE — Telephone Encounter (Signed)
Pt calling stating he has about 1 week left on his Xarelto  He called insurance company and they are stating what we have him on  xarelto 2.5 mg that this is considered a tier 4 and found out that if we gave him a stronger dose that it would be cheaper on patient  He states she told her that if we though it was okay, he could try the 10 mg or 20 mg and it would be about $45 for 90 day supply   He just wants our advise on this  Please call back

## 2017-08-17 NOTE — Telephone Encounter (Signed)
Please advise pharmacy recommendation for tier exception.

## 2017-08-17 NOTE — Telephone Encounter (Signed)
Pt recently prescribed xarelto 2.5mg  by Dr. [name] along with aspirin for PAD. This dosage is not affordable for patient at $90/month. He was denied a tier exception and is in the process of filing a grievance. Insurance company states he should have a reply in 12 days.  Pt was given 30-day free card which he used but will run out of medication soon.  Samples have been provided to patient however, he asks if another medication may be recommended if tier exception is not granted. [facility] told patient xarelto 10mg  would cost $45/90 days. Explained this dosage is 4 x what was prescribed.  Routed to Dr. [name] to advise.  Medication Samples have been provided to the patient.  Drug name: Xarelto       Strength: 2.5mg         Qty: 2 bottles  LOT: [id number]  Exp.Date: 9/21

## 2017-08-20 NOTE — Telephone Encounter (Signed)
Ok we can discuss alternatives upon his follow up.

## 2017-08-21 NOTE — Telephone Encounter (Signed)
Pt agreeable. I have placed xarelto samples at the front for pick up as he is awaiting to hear from grievance he filed with insurance regarding this medication.  Samples of this drug were given to the patient, quantity 2 bottles  Lot Number [id number]  Exp: 9/21

## 2017-08-22 ENCOUNTER — Other Ambulatory Visit: Payer: Self-pay

## 2017-08-22 NOTE — Patient Outreach (Signed)
[facility]) Care Management  08/22/2017  [name] [birth year] [id number]   TELEPHONE SCREENING Referral date: 08/22/17 Referral source: [facility] Referral reason: Medication assistance Insurance: [employer]  Telephone call to patient regarding [facility] UM referral. HIPAA verified with patient. Discussed and offered [facility] care management services. Patient verbally agreed. Patient states he is having trouble affording his xeralto. Patient states he received his first 30 days free and now he is using the samples given to him by his doctor. Patient states he has enough xeralto to last him until his appointment in February with his doctor.  Patient states his main concern is the xeralto putting him in the donut hole which will make it difficult for him to manage paying for his medications.  Patient denied any additional concerns.    PLAN: RNCM will refer patient to pharmacy  Quinn Plowman RN,BSN,CCM [facility] Telephonic  [phone]

## 2017-09-01 ENCOUNTER — Ambulatory Visit (INDEPENDENT_AMBULATORY_CARE_PROVIDER_SITE_OTHER): Payer: PPO | Admitting: Family Medicine

## 2017-09-01 ENCOUNTER — Encounter: Payer: Self-pay | Admitting: Family Medicine

## 2017-09-01 VITALS — BP 120/62 | HR 79 | Temp 98.2°F | Wt 221.0 lb

## 2017-09-01 DIAGNOSIS — E119 Type 2 diabetes mellitus without complications: Secondary | ICD-10-CM | POA: Diagnosis not present

## 2017-09-01 DIAGNOSIS — I10 Essential (primary) hypertension: Secondary | ICD-10-CM | POA: Diagnosis not present

## 2017-09-01 DIAGNOSIS — E782 Mixed hyperlipidemia: Secondary | ICD-10-CM | POA: Diagnosis not present

## 2017-09-01 DIAGNOSIS — I739 Peripheral vascular disease, unspecified: Secondary | ICD-10-CM

## 2017-09-01 MED ORDER — ROSUVASTATIN CALCIUM 10 MG PO TABS: 10.0000 mg | ORAL_TABLET | Freq: Every day | ORAL | 3 refills | Status: DC

## 2017-09-01 NOTE — Assessment & Plan Note (Signed)
Appreciate cards care of patient. On low dose xarelto 2.5mg  bid.

## 2017-09-01 NOTE — Assessment & Plan Note (Signed)
Chronic, stable. Will change zocor to crestor and continue zetia. Pt agrees with plan.  The 10-year ASCVD risk score [name] [name]., et al., 2013) is: 46.2%   Values used to calculate the score:     Age: 77 years     Sex: Male     Is Non-Hispanic African American: No     Diabetic: Yes     Tobacco smoker: No     Systolic Blood Pressure: 952 mmHg     Is BP treated: Yes     HDL Cholesterol: 34.9 mg/dL     Total Cholesterol: 141 mg/dL

## 2017-09-01 NOTE — Patient Instructions (Addendum)
Continue working on diabetic diet.  Let's change simvastatin to crestor - take 10mg  nightly. Let me know how you tolerate this.  Return as needed or for medicare wellness visit and physical.  Diabetes Mellitus and Nutrition When you have diabetes (diabetes mellitus), it is very important to have healthy eating habits because your blood sugar (glucose) levels are greatly affected by what you eat and drink. Eating healthy foods in the appropriate amounts, at about the same times every day, can help you:  Control your blood glucose.  Lower your risk of heart disease.  Improve your blood pressure.  Reach or maintain a healthy weight.  Every person with diabetes is different, and each person has different needs for a meal plan. Your health care provider may recommend that you work with a diet and nutrition specialist (dietitian) to make a meal plan that is best for you. Your meal plan may vary depending on factors such as:  The calories you need.  The medicines you take.  Your weight.  Your blood glucose, blood pressure, and cholesterol levels.  Your activity level.  Other health conditions you have, such as heart or kidney disease.  How do carbohydrates affect me? Carbohydrates affect your blood glucose level more than any other type of food. Eating carbohydrates naturally increases the amount of glucose in your blood. Carbohydrate counting is a method for keeping track of how many carbohydrates you eat. Counting carbohydrates is important to keep your blood glucose at a healthy level, especially if you use insulin or take certain oral diabetes medicines. It is important to know how many carbohydrates you can safely have in each meal. This is different for every person. Your dietitian can help you calculate how many carbohydrates you should have at each meal and for snack. Foods that contain carbohydrates include:  Bread, cereal, rice, pasta, and crackers.  Potatoes and corn.  Peas,  beans, and lentils.  Milk and yogurt.  Fruit and juice.  Desserts, such as cakes, cookies, ice cream, and candy.  How does alcohol affect me? Alcohol can cause a sudden decrease in blood glucose (hypoglycemia), especially if you use insulin or take certain oral diabetes medicines. Hypoglycemia can be a life-threatening condition. Symptoms of hypoglycemia (sleepiness, dizziness, and confusion) are similar to symptoms of having too much alcohol. If your health care provider says that alcohol is safe for you, follow these guidelines:  Limit alcohol intake to no more than 1 drink per day for nonpregnant women and 2 drinks per day for men. One drink equals 12 oz of beer, 5 oz of wine, or 1 oz of hard liquor.  Do not drink on an empty stomach.  Keep yourself hydrated with water, diet soda, or unsweetened iced tea.  Keep in mind that regular soda, juice, and other mixers may contain a lot of sugar and must be counted as carbohydrates.  What are tips for following this plan? Reading food labels  Start by checking the serving size on the label. The amount of calories, carbohydrates, fats, and other nutrients listed on the label are based on one serving of the food. Many foods contain more than one serving per package.  Check the total grams (g) of carbohydrates in one serving. You can calculate the number of servings of carbohydrates in one serving by dividing the total carbohydrates by 15. For example, if a food has 30 g of total carbohydrates, it would be equal to 2 servings of carbohydrates.  Check the number of grams (  g) of saturated and trans fats in one serving. Choose foods that have low or no amount of these fats.  Check the number of milligrams (mg) of sodium in one serving. Most people should limit total sodium intake to less than 2,300 mg per day.  Always check the nutrition information of foods labeled as "low-fat" or "nonfat". These foods may be higher in added sugar or refined  carbohydrates and should be avoided.  Talk to your dietitian to identify your daily goals for nutrients listed on the label. Shopping  Avoid buying canned, premade, or processed foods. These foods tend to be high in fat, sodium, and added sugar.  Shop around the outside edge of the grocery store. This includes fresh fruits and vegetables, bulk grains, fresh meats, and fresh dairy. Cooking  Use low-heat cooking methods, such as baking, instead of high-heat cooking methods like deep frying.  Cook using healthy oils, such as olive, canola, or sunflower oil.  Avoid cooking with butter, cream, or high-fat meats. Meal planning  Eat meals and snacks regularly, preferably at the same times every day. Avoid going long periods of time without eating.  Eat foods high in fiber, such as fresh fruits, vegetables, beans, and whole grains. Talk to your dietitian about how many servings of carbohydrates you can eat at each meal.  Eat 4-6 ounces of lean protein each day, such as lean meat, chicken, fish, eggs, or tofu. 1 ounce is equal to 1 ounce of meat, chicken, or fish, 1 egg, or 1/4 cup of tofu.  Eat some foods each day that contain healthy fats, such as avocado, nuts, seeds, and fish. Lifestyle   Check your blood glucose regularly.  Exercise at least 30 minutes 5 or more days each week, or as told by your health care provider.  Take medicines as told by your health care provider.  Do not use any products that contain nicotine or tobacco, such as cigarettes and e-cigarettes. If you need help quitting, ask your health care provider.  Work with a Social worker or diabetes educator to identify strategies to manage stress and any emotional and social challenges. What are some questions to ask my health care provider?  Do I need to meet with a diabetes educator?  Do I need to meet with a dietitian?  What number can I call if I have questions?  When are the best times to check my blood  glucose? Where to find more information:  American Diabetes Association: diabetes.org/food-and-fitness/food  Academy of Nutrition and Dietetics: PokerClues.dk  [employer] of Diabetes and Digestive and Kidney Diseases (NIH): ContactWire.be Summary  A healthy meal plan will help you control your blood glucose and maintain a healthy lifestyle.  Working with a diet and nutrition specialist (dietitian) can help you make a meal plan that is best for you.  Keep in mind that carbohydrates and alcohol have immediate effects on your blood glucose levels. It is important to count carbohydrates and to use alcohol carefully. This information is not intended to replace advice given to you by your health care provider. Make sure you discuss any questions you have with your health care provider. Document Released: 04/28/2005 Document Revised: 09/05/2016 Document Reviewed: 09/05/2016 Elsevier Interactive Patient Education  [employer].

## 2017-09-01 NOTE — Assessment & Plan Note (Signed)
Chronic, stable. Continue current regimen. 

## 2017-09-01 NOTE — Progress Notes (Signed)
BP 120/62 (BP Location: Left Arm, Patient Position: Sitting, Cuff Size: Normal)   Pulse 79   Temp 98.2 F (36.8 C) (Oral)   Wt 221 lb (100.2 kg)   SpO2 94%   BMI 29.97 kg/m    CC: 3 mo f/u visit  Subjective:    Patient ID: [name], male    DOB: March 18, 1941, 77 y.o.   MRN: [id number]  HPI: [name] is a 77 y.o. male presenting on 09/01/2017 for 3 mo follow-up   Saw Dr [name] 06/2017 - started on xarelto 2.5mg  bid along with continued aspirin for known PAD.  Seen here last month with CAP - this has fully resolved.   DM - does not regularly check sugars - doesn't have glucose meter. Compliant with antihyperglycemic regimen which includes: diet controlled. Denies low sugars or hypoglycemic symptoms. + paresthesias managed on gabapentin 300mg  nightly. Last diabetic eye exam late last year. Pneumovax: 2010. Prevnar: 2016. Glucometer brand: doesn't use. DSME: has not completed. Lab Results  Component Value Date   HGBA1C 6.5 06/05/2017   Diabetic Foot Exam - Simple   No data filed     Lab Results  Component Value Date   MICROALBUR <0.7 11/29/2016     Relevant past medical, surgical, family and social history reviewed and updated as indicated. Interim medical history since our last visit reviewed. Allergies and medications reviewed and updated. Outpatient Medications Prior to Visit  Medication Sig Dispense Refill  . albuterol (PROVENTIL HFA;VENTOLIN HFA) 108 (90 Base) MCG/ACT inhaler Inhale 2 puffs into the lungs every 6 (six) hours as needed for wheezing or shortness of breath. 1 Inhaler 0  . aspirin EC 81 MG tablet Take 1 tablet (81 mg total) by mouth daily. 90 tablet 3  . docusate sodium (COLACE) 100 MG capsule Take 1 capsule (100 mg total) by mouth 2 (two) times daily. 60 capsule 0  . ezetimibe (ZETIA) 10 MG tablet TAKE ONE TABLET BY MOUTH EVERY DAY 90 tablet 3  . finasteride (PROSCAR) 5 MG tablet Take 5 mg by mouth daily.      Marland Kitchen gabapentin (NEURONTIN) 300 MG  capsule Take 300 mg by mouth at bedtime.    . isosorbide mononitrate (IMDUR) 30 MG 24 hr tablet TAKE 1 TABLET BY MOUTH DAILY 90 tablet 1  . methocarbamol (ROBAXIN) 500 MG tablet Take 1 tablet (500 mg total) by mouth every 6 (six) hours as needed for muscle spasms. 80 tablet 0  . nitroGLYCERIN (NITROSTAT) 0.4 MG SL tablet Place 1 tablet (0.4 mg total) under the tongue every 5 (five) minutes as needed for chest pain. 25 tablet 6  . pantoprazole (PROTONIX) 40 MG tablet Take 40 mg by mouth daily.    . Rivaroxaban (XARELTO) 2.5 MG TABS Take 1 tablet by mouth 2 (two) times daily. 180 tablet 3  . tolterodine (DETROL LA) 4 MG 24 hr capsule Take 4 mg by mouth daily.    . predniSONE (DELTASONE) 10 MG tablet 3 tabs by mouth daily x 3 days, then 2 tabs by mouth daily x 2 days then 1 tab by mouth daily x 2 days 15 tablet 0  . simvastatin (ZOCOR) 40 MG tablet TAKE ONE TABLET BY MOUTH EVERY DAY 90 tablet 3  . triamcinolone ointment (KENALOG) 0.5 % Apply 1 application topically 2 (two) times daily. 30 g 0   No facility-administered medications prior to visit.      Per HPI unless specifically indicated in ROS section below Review of Systems  Objective:    BP 120/62 (BP Location: Left Arm, Patient Position: Sitting, Cuff Size: Normal)   Pulse 79   Temp 98.2 F (36.8 C) (Oral)   Wt 221 lb (100.2 kg)   SpO2 94%   BMI 29.97 kg/m   Wt Readings from Last 3 Encounters:  09/01/17 221 lb (100.2 kg)  07/13/17 217 lb 12 oz (98.8 kg)  06/16/17 217 lb 8 oz (98.7 kg)    Physical Exam  Constitutional: He appears well-developed and well-nourished. No distress.  HENT:  Mouth/Throat: Oropharynx is clear and moist. No oropharyngeal exudate.  Eyes: Conjunctivae and EOM are normal. Pupils are equal, round, and reactive to light. No scleral icterus.  Neck: Normal range of motion. Neck supple.  Cardiovascular: Normal rate, regular rhythm, normal heart sounds and intact distal pulses.  No murmur  heard. Pulmonary/Chest: Effort normal and breath sounds normal. No respiratory distress. He has no wheezes. He has no rales.  Musculoskeletal: He exhibits no edema.  Skin: Skin is warm and dry. No rash noted.  Psychiatric: He has a normal mood and affect.  Nursing note and vitals reviewed.  Lab Results  Component Value Date   CHOL 141 11/29/2016   HDL 34.90 (L) 11/29/2016   LDLCALC 80 11/29/2016   LDLDIRECT 106.0 01/01/2016   TRIG 128.0 11/29/2016   CHOLHDL 4 11/29/2016       Assessment & Plan:   Problem List Items Addressed This Visit    Diet-controlled diabetes mellitus ([place]) - Primary    Chronic, stable. Discussed diabetic diet. Diet controlled.       Relevant Medications   rosuvastatin (CRESTOR) 10 MG tablet   Essential hypertension    Chronic, stable. Continue current regimen.       Relevant Medications   rosuvastatin (CRESTOR) 10 MG tablet   Hyperlipidemia    Chronic, stable. Will change zocor to crestor and continue zetia. Pt agrees with plan.  The 10-year ASCVD risk score Mikey Bussing DC Brooke Bonito., et al., 2013) is: 46.2%   Values used to calculate the score:     Age: 79 years     Sex: Male     Is Non-Hispanic African American: No     Diabetic: Yes     Tobacco smoker: No     Systolic Blood Pressure: 748 mmHg     Is BP treated: Yes     HDL Cholesterol: 34.9 mg/dL     Total Cholesterol: 141 mg/dL       Relevant Medications   rosuvastatin (CRESTOR) 10 MG tablet   PVD (peripheral vascular disease) with claudication ([place])    Appreciate cards care of patient. On low dose xarelto 2.5mg  bid.       Relevant Medications   rosuvastatin (CRESTOR) 10 MG tablet       Follow up plan: Return in about 3 months (around 11/30/2017) for annual exam, prior fasting for blood work.  Ria Bush, MD

## 2017-09-01 NOTE — Assessment & Plan Note (Signed)
Chronic, stable. Discussed diabetic diet. Diet controlled.

## 2017-09-05 ENCOUNTER — Telehealth: Payer: Self-pay | Admitting: Pharmacist

## 2017-09-05 NOTE — Patient Outreach (Signed)
[facility]) Care Management  09/05/2017  [name] 1941/03/23 [id number]   Per [employer] representative, patient had a non-formulary exception request sent 07/14/2017. This request was approved and then continued through the new year. This medication was approved and for a tier 4 which is a $90 copayment.   Carlean Jews, Pharm.D., BCPS PGY2 Ambulatory Care Pharmacy Resident Phone: [phone]

## 2017-09-05 NOTE — Patient Outreach (Addendum)
[facility]) Care Management  09/05/2017  [name] 1941-04-08 [id number]   77 y.o. year old male referred to [facility] for Medication Assistance (Pharmacy telephone outreach)  Patient with [employer] medicare advantage plan.   Patient confirms identity with HIPAA-identifiers x2 and gives verbal consent to speak over the phone about medications. Patient reports that his insurance plan requires him to pay $90/month for Xarelto 2.5 mg BID (for CAD diagnosis). He states this is unaffordable in the long run and he wants to avoid the donut hole. Patient reports that he does NOT think he will qualify for patient assistance programs as he and his wife make ~$3,000/month each. He is currently obtaining the medication through samples from his doctor's office. States he is going to talk to his doctor about this cost. Reports doctor is aware that this medication has to be changed when samples are no longer available.   Patient denies further [facility] or pharmacy needs. Provided patient with my phone number if questions or concerns arise later in the year.   #Medication Assistance - patient with [employer] medicare advantage plan and is not in donut hole. Unfortunately, patient will not qualify for patient assistance program at this time 2/2 yearly income cut off of $49,380/2 persons per [employer] website. Additionally, patient would have to spend 4% of total annual income of medication costs year-to-date to qualify.   -Discussed indication for lower dose Xarelto with patient  -Encouraged patient to remain adherent to medication samples for the time being -Encouraged patient to discuss with doctor at upcoming appointment 10/06/2017   Will send barriers letter and case closed letter to patient and physician.   Carlean Jews, Pharm.D., BCPS PGY2 Ambulatory Care Pharmacy Resident Phone: [phone]

## 2017-09-17 ENCOUNTER — Encounter: Payer: Self-pay | Admitting: Cardiovascular Disease

## 2017-09-19 ENCOUNTER — Telehealth: Payer: Self-pay | Admitting: Cardiovascular Disease

## 2017-09-19 NOTE — Telephone Encounter (Signed)
Please call regarding Xarelto. Pt would like samples.

## 2017-09-19 NOTE — Telephone Encounter (Signed)
I spoke with [facility]. Quoted 1 month supply xarelto 2.5mg  BID $45; 3 month supply $90.  Notified patient of these prices. States he still has some samples left.  Patient states he continues to have pain in lower extremities  Dec 2018 LEA results: "Notes recorded by [name], MD on 08/03/2017 at 1:44 PM EST Duplex showed significant disease in both SFAs (thigh arteries). Continue medical therapy for now and if leg symptoms are not improved with medications, we can consider angiography and possible stents."  He has a Feb 22 OV with Dr. [name] to discuss.  Pt added to wait list

## 2017-09-19 NOTE — Telephone Encounter (Signed)
Reviewed with Dr. [name]. Pt added 2/8, 3:20pm. Pt notified and appreciative.

## 2017-09-21 DIAGNOSIS — Z96651 Presence of right artificial knee joint: Secondary | ICD-10-CM | POA: Diagnosis not present

## 2017-09-21 DIAGNOSIS — Z471 Aftercare following joint replacement surgery: Secondary | ICD-10-CM | POA: Diagnosis not present

## 2017-09-21 DIAGNOSIS — M1711 Unilateral primary osteoarthritis, right knee: Secondary | ICD-10-CM | POA: Diagnosis not present

## 2017-09-22 ENCOUNTER — Ambulatory Visit: Payer: PPO | Admitting: Cardiovascular Disease

## 2017-09-22 ENCOUNTER — Encounter: Payer: Self-pay | Admitting: Cardiovascular Disease

## 2017-09-22 VITALS — BP 124/60 | HR 76 | Ht 73.0 in | Wt 221.5 lb

## 2017-09-22 DIAGNOSIS — I739 Peripheral vascular disease, unspecified: Secondary | ICD-10-CM

## 2017-09-22 DIAGNOSIS — E785 Hyperlipidemia, unspecified: Secondary | ICD-10-CM | POA: Diagnosis not present

## 2017-09-22 DIAGNOSIS — I10 Essential (primary) hypertension: Secondary | ICD-10-CM | POA: Diagnosis not present

## 2017-09-22 MED ORDER — SIMVASTATIN 40 MG PO TABS: 40.0000 mg | ORAL_TABLET | Freq: Every day | ORAL | Status: DC

## 2017-09-22 NOTE — Patient Instructions (Signed)
Medication Instructions: - Your physician has recommended you make the following change in your medication:  1) Increase gabapentin 300 mg- take 1 tablet by mouth three times a day 2) Stop crestor 3) Re-start simvastatin 40 mg- take 1 tablet by mouth once daily 4) Stop xarelto when you finish your current supply  Labwork: - none ordered  Procedures/Testing: - none ordered  Follow-Up: - Your physician recommends that you schedule a follow-up appointment in: 2 months with Dr. [name].    Any Additional Special Instructions Will Be Listed Below (If Applicable).     If you need a refill on your cardiac medications before your next appointment, please call your pharmacy.

## 2017-09-22 NOTE — Progress Notes (Signed)
Cardiology Office Note   Date:  09/22/2017   ID:  [name], DOB 1941/04/26, MRN [id number]  PCP:  Ria Bush, MD  Cardiologist:  Dr. Rockey Situ  Chief Complaint  Patient presents with  . Other    patient c/o bilateral leg and feet pain, Numbness and tingling. Meds reviewed evrbally with patient.      History of Present Illness: [name] is a 77 y.o. male who is here today for a follow-up visit regarding peripheral arterial disease. The patient has known history of coronary artery disease status post CABG in 1990, PCI of the left circumflex in 2002, DVT in the left lower extremity, hyperlipidemia and back surgery for spinal stenosis in 2017.  He had hip replacement x 2. 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  He was seen recently for peripheral arterial disease.  Lower extremity arterial Doppler  showed noncompressible arteries on the right side with normal toe brachial index and biphasic waveform.  Left ABI was normal with biphasic waveforms. His symptoms were felt to be multifactorial due to arthritis and neuropathy. He underwent lower extremity arterial duplex which showed evidence of bilateral SFA disease. I started him on low-dose Xarelto which he is currently taking but he does not he can afford this long-term given his high co-pay.  There has been no change in his leg symptoms.  He mostly has numbness and burning sensation from the knees to the feet in the anterior area.  He does have mild calf discomfort with walking but that does not seem to be his biggest complaint.  Past Medical History:  Diagnosis Date  . BENIGN PROSTATIC HYPERTROPHY, WITH URINARY OBSTRUCTION 09/06/2007  . 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 Diabetes mellitus without complication ([place])    diet controled per dr note  . 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 DVT, HX OF 1998  . GERD 04/30/2007  . Headache    hx migraines  . History of hiatal hernia   . History of shingles   . HYPERLIPIDEMIA 04/26/2007  . HYPERTENSION 04/30/2007  . INGUINAL HERNIA, RIGHT 05/26/2010  . LOC OSTEOARTHROS NOT SPEC PRIM/SEC LOWER LEG 11/05/2009  . Lumbar disc disease with radiculopathy   . Myocardial infarction ([place]) 1989  . OSTEOARTHRITIS 04/26/2007  . 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 Pneumonia 07/2014   [facility] hospitalization  . PSA, INCREASED 07/09/2008  . Spinal stenosis of lumbar region     Past Surgical History:  Procedure Laterality Date  . APPENDECTOMY     rupture  . CARDIAC CATHETERIZATION [name] 07/07/2015   Procedure: Left Heart Cath and Cors/Grafts Angiography;  Surgeon: [name], MD;  Location: [facility];  Service: Cardiovascular;  Laterality: [name];  . CATARACT EXTRACTION, BILATERAL    . CERVICAL SPINE SURGERY  12/2016   cervical stenosis [name])  . COLONOSCOPY  03/2010   HP polyp, diverticulosis, rpt 10 yrs ([name])  . CORONARY ARTERY BYPASS GRAFT  1990   3 vessel   . CYSTOSCOPY  12/23/10   [facility]  . KNEE ARTHROSCOPY Right    x2  . LAMINOTOMY  1986   L5/S1 lumbar laminotomy for two ruptured discs/fusion  . LUMBAR LAMINECTOMY/DECOMPRESSION MICRODISCECTOMY [name] 07/13/2016   Procedure: LUMBAR TWO-THREE, LUMBAR THREE-FOUR, LUMBAR FOUR-FIVE LAMINECTOMY AND FORAMINOTOMY;  Surgeon: [name], MD;  Location: [facility];  Service: Neurosurgery;  Laterality: [name];  LAMINECTOMY AND FORAMINOTOMY L2-L3, L3-L4,L4-L5  . TOTAL HIP ARTHROPLASTY Bilateral 1999,2000  . TOTAL KNEE ARTHROPLASTY Right 03/06/2017   Procedure: RIGHT TOTAL KNEE ARTHROPLASTY;  Surgeon: Gaynelle Arabian, MD;  Location: [facility];  Service: Orthopedics;  Laterality: Right;     Current Outpatient Medications  Medication Sig Dispense Refill  . albuterol (PROVENTIL HFA;VENTOLIN HFA) 108 (90 Base) MCG/ACT inhaler Inhale 2  puffs into the lungs every 6 (six) hours as needed for wheezing or shortness of breath. 1 Inhaler 0  . aspirin EC 81 MG tablet Take 1 tablet (81 mg total) by mouth daily. 90 tablet 3  . docusate sodium (COLACE) 100 MG capsule Take 1 capsule (100 mg total) by mouth 2 (two) times daily. 60 capsule 0  . ezetimibe (ZETIA) 10 MG tablet TAKE ONE TABLET BY MOUTH EVERY DAY 90 tablet 3  . finasteride (PROSCAR) 5 MG tablet Take 5 mg by mouth daily.      Marland Kitchen gabapentin (NEURONTIN) 300 MG capsule Take 300 mg by mouth 3 (three) times daily.    . isosorbide mononitrate (IMDUR) 30 MG 24 hr tablet TAKE 1 TABLET BY MOUTH DAILY 90 tablet 1  . methocarbamol (ROBAXIN) 500 MG tablet Take 1 tablet (500 mg total) by mouth every 6 (six) hours as needed for muscle spasms. 80 tablet 0  . nitroGLYCERIN (NITROSTAT) 0.4 MG SL tablet Place 1 tablet (0.4 mg total) under the tongue every 5 (five) minutes as needed for chest pain. 25 tablet 6  . pantoprazole (PROTONIX) 40 MG tablet Take 40 mg by mouth daily.    Marland Kitchen tolterodine (DETROL LA) 4 MG 24 hr capsule Take 4 mg by mouth daily.    . simvastatin (ZOCOR) 40 MG tablet Take 1 tablet (40 mg total) by mouth at bedtime.     No current facility-administered medications for this visit.     Allergies:   Morphine and Vioxx [rofecoxib]    Social History:  The patient  reports that he quit smoking about 29 years ago. His smoking use included cigarettes. He has a 25.00 pack-year smoking history. he has never used smokeless tobacco. He reports that he does not drink alcohol or use drugs.   Family History:  The patient's family history includes Cancer in his other and sister; Diabetes in his brother and mother; Heart attack in his mother; Heart disease in his father; Kidney disease in his father and sister; Stroke in his father and mother.    ROS:  Please see the history of present illness.   Otherwise, review of systems are positive for none.   All other systems are reviewed and negative.     PHYSICAL EXAM: VS:  BP 124/60 (BP Location: Left Arm, Patient Position: Sitting, Cuff Size: Normal)   Pulse 76   Ht 6\' 1"  (1.854 m)   Wt 221 lb 8 oz (100.5 kg)   BMI 29.22 kg/m  , BMI Body mass index is 29.22 kg/m. GEN: Well nourished, well developed, in no acute distress  HEENT: normal  Neck: no JVD, carotid bruits, or masses Cardiac: RRR; no  rubs, or gallops,no edema .  1 out of 6 systolic ejection murmur in the aortic area Respiratory:  clear to auscultation bilaterally, normal work of breathing GI: soft, nontender, nondistended, + BS MS: no deformity or atrophy  Skin: warm and dry, no rash Neuro:  Strength and sensation are intact Psych: euthymic mood, full affect Vascular: Femoral pulses +1 on the right side with  scarring related to previous cardiac catheterization, left femoral pulses normal.  Distal pulses are nonpalpable  EKG:  EKG is ordered today. The ekg ordered today demonstrates normal sinus rhythm with left axis deviation.   Recent Labs: 02/27/2017: ALT 17 03/08/2017: Hemoglobin 12.0; Platelets 176 06/05/2017: BUN 17; Creatinine, Ser 1.05; Potassium 4.1; Sodium 138; TSH 4.06    Lipid Panel    Component Value Date/Time   CHOL 141 11/29/2016 1025   TRIG 128.0 11/29/2016 1025   HDL 34.90 (L) 11/29/2016 1025   CHOLHDL 4 11/29/2016 1025   VLDL 25.6 11/29/2016 1025   LDLCALC 80 11/29/2016 1025   LDLDIRECT 106.0 01/01/2016 0844      Wt Readings from Last 3 Encounters:  09/22/17 221 lb 8 oz (100.5 kg)  09/01/17 221 lb (100.2 kg)  07/13/17 217 lb 12 oz (98.8 kg)      No flowsheet data found.    ASSESSMENT AND PLAN:  1.  Peripheral arterial disease: The patient does have peripheral arterial disease with significant bilateral SFA disease.   However, his calf claudication seems to be mild he is more limited by what seems to be chronic arthritis pain as well as severe peripheral neuropathy.  Thus, I do not think he would benefit significantly from  revascularization.  He has not been able to afford low-dose Xarelto and thus I am going to stop this medication for now.  I will consider adding cilostazol in the near future.  Reevaluate symptoms in few months consider angiography if convincing symptoms of claudication.  2.  Coronary artery disease involving native coronary arteries without angina: Continue medical therapy.  3.  Hyperlipidemia: He was switched to rosuvastatin but reports inability to tolerate the medication due to myalgia.  He stopped taking the medication after about 10 days.  Going to place him back on simvastatin which she has tolerated before.  He also takes Zetia.  4.  Severe peripheral neuropathy: His symptoms are consistent with that.  I elected to increase gabapentin to 300 mg 3 times daily.    Disposition:   FU with me in 2 months  Signed,  Kathlyn Sacramento, MD  09/22/2017 4:13 PM    [facility]

## 2017-09-25 DIAGNOSIS — D692 Other nonthrombocytopenic purpura: Secondary | ICD-10-CM | POA: Diagnosis not present

## 2017-09-25 DIAGNOSIS — L578 Other skin changes due to chronic exposure to nonionizing radiation: Secondary | ICD-10-CM | POA: Diagnosis not present

## 2017-09-25 DIAGNOSIS — L219 Seborrheic dermatitis, unspecified: Secondary | ICD-10-CM | POA: Diagnosis not present

## 2017-09-25 DIAGNOSIS — L570 Actinic keratosis: Secondary | ICD-10-CM | POA: Diagnosis not present

## 2017-09-25 DIAGNOSIS — L821 Other seborrheic keratosis: Secondary | ICD-10-CM | POA: Diagnosis not present

## 2017-10-01 ENCOUNTER — Encounter: Payer: Self-pay | Admitting: Family Medicine

## 2017-10-01 DIAGNOSIS — E119 Type 2 diabetes mellitus without complications: Secondary | ICD-10-CM

## 2017-10-03 DIAGNOSIS — N4340 Spermatocele of epididymis, unspecified: Secondary | ICD-10-CM | POA: Diagnosis not present

## 2017-10-03 DIAGNOSIS — N50812 Left testicular pain: Secondary | ICD-10-CM | POA: Diagnosis not present

## 2017-10-03 DIAGNOSIS — N3281 Overactive bladder: Secondary | ICD-10-CM | POA: Diagnosis not present

## 2017-10-05 NOTE — Telephone Encounter (Signed)
DSME referral placed.

## 2017-10-06 ENCOUNTER — Ambulatory Visit: Payer: PPO | Admitting: Cardiovascular Disease

## 2017-10-06 DIAGNOSIS — G629 Polyneuropathy, unspecified: Secondary | ICD-10-CM | POA: Diagnosis not present

## 2017-10-06 DIAGNOSIS — Z6829 Body mass index (BMI) 29.0-29.9, adult: Secondary | ICD-10-CM | POA: Diagnosis not present

## 2017-10-06 DIAGNOSIS — G5622 Lesion of ulnar nerve, left upper limb: Secondary | ICD-10-CM | POA: Diagnosis not present

## 2017-10-17 ENCOUNTER — Encounter: Payer: Self-pay | Admitting: Dietician

## 2017-10-17 ENCOUNTER — Encounter: Payer: PPO | Attending: Family Medicine | Admitting: Dietician

## 2017-10-17 VITALS — BP 138/66 | Ht 73.0 in | Wt 219.9 lb

## 2017-10-17 DIAGNOSIS — Z713 Dietary counseling and surveillance: Secondary | ICD-10-CM | POA: Diagnosis not present

## 2017-10-17 DIAGNOSIS — E1140 Type 2 diabetes mellitus with diabetic neuropathy, unspecified: Secondary | ICD-10-CM

## 2017-10-17 DIAGNOSIS — E119 Type 2 diabetes mellitus without complications: Secondary | ICD-10-CM | POA: Diagnosis not present

## 2017-10-17 NOTE — Progress Notes (Signed)
Diabetes Self-Management Education  Visit Type: First/Initial  Appt. Start Time: 1345 Appt. End Time: 8938  10/17/2017  Mr. [name], identified by name and date of birth, is a 77 y.o. male with a diagnosis of Diabetes: Type 2.   ASSESSMENT  Blood pressure 138/66, height 6\' 1"  (1.854 m), weight 219 lb 14.4 oz (99.7 kg). Body mass index is 29.01 kg/m.  Diabetes Self-Management Education - 10/17/17 1538      Visit Information   Visit Type  First/Initial      Initial Visit   Diabetes Type  Type 2      Health Coping   How would you rate your overall health?  Fair      Psychosocial Assessment   Patient Belief/Attitude about Diabetes  Motivated to manage diabetes    Self-care barriers  None    Self-management support  Doctor's office;Family    Other persons present  Patient;Spouse/SO    Patient Concerns  Medication;Glycemic Control    Special Needs  None    Preferred Learning Style  Hands on    Learning Readiness  Ready    What is the last grade level you completed in school?  12      Pre-Education Assessment   Patient understands the diabetes disease and treatment process.  Needs Review    Patient understands incorporating nutritional management into lifestyle.  Needs Instruction    Patient undertands incorporating physical activity into lifestyle.  Needs Review    Patient understands using medications safely.  Needs Instruction    Patient understands monitoring blood glucose, interpreting and using results  Needs Review    Patient understands prevention, detection, and treatment of acute complications.  Needs Review    Patient understands prevention, detection, and treatment of chronic complications.  Needs Review    Patient understands how to develop strategies to address psychosocial issues.  Needs Instruction    Patient understands how to develop strategies to promote health/change behavior.  Needs Review      Complications   Last HgB A1C per patient/outside  source  6.5 % 05-2017    How often do you check your blood sugar?  1-2 times/day 120's-occasional 150's    Fasting Blood glucose range (mg/dL)  70-129;130-179    Have you had a dilated eye exam in the past 12 months?  Yes 08-13-17    Have you had a dental exam in the past 12 months?  No 2 yr ago-has dentures and  has only 2 lower teeth     Are you checking your feet?  Yes    How many days per week are you checking your feet?  7 c/o burning/numbness left lower leg and pain lower legs and feet      Dietary Intake   Breakfast  eats breakfast at 9a-10a-eats cheerios and milk or 2 eggs with cheese + bacon (has made recent diet improvements)    Snack (morning)  none    Lunch  occasionally skips-eats at 2p when he does have lunch    Snack (afternoon)  none    Dinner  eats supper at 7p    Snack (evening)  eats nabs, snack bars, popcorn or protein shake at 10p    Beverage(s)  drinks 4-5 glasses of water/day and 2-3 glasses of unsweet tea or coffee      Exercise   Exercise Type  Light (walking / raking leaves) rides bike + leg exercises    How many days per week to you exercise?  3.5    How many minutes per day do you exercise?  45    Total minutes per week of exercise  157.5      Patient Education   Previous Diabetes Education  No    Disease state   Definition of diabetes, type 1 and 2, and the diagnosis of diabetes    Nutrition management   Role of diet in the treatment of diabetes and the relationship between the three main macronutrients and blood glucose level;Carbohydrate counting;Food label reading, portion sizes and measuring food.    Physical activity and exercise   Role of exercise on diabetes management, blood pressure control and cardiac health.;Helped patient identify appropriate exercises in relation to his/her diabetes, diabetes complications and other health issue.    Monitoring  Taught/discussed recording of test results and interpretation of SMBG.;Identified appropriate SMBG  and/or A1C goals.;Purpose and frequency of SMBG.;Yearly dilated eye exam;Daily foot exams    Chronic complications  Relationship between chronic complications and blood glucose control;Retinopathy and reason for yearly dilated eye exams;Reviewed with patient heart disease, higher risk of, and prevention;Dental care;Nephropathy, what it is, prevention of, the use of ACE, ARB's and early detection of through urine microalbumia.;Lipid levels, blood glucose control and heart disease;Assessed and discussed foot care and prevention of foot problems    Personal strategies to promote health  Lifestyle issues that need to be addressed for better diabetes care;Helped patient develop diabetes management plan for (enter comment)      Outcomes   Expected Outcomes  Demonstrated interest in learning. Expect positive outcomes       Individualized Plan for Diabetes Self-Management Training:   Learning Objective:  Patient will have a greater understanding of diabetes self-management. Patient education plan is to attend individual and/or group sessions per assessed needs and concerns.     Patient Instructions   Check blood sugars 2 x day before breakfast and 2 hrs after supper every day and record Bring blood sugar records to the next appointment/class Exercise:  Continue riding bike/leg exercises 23min. 4x/wk. As tolerated Eat 3 meals day and   1  snack a day at bedtime Eat 3-4 carbohydrate servings/meal +  protein Eat 1 carbohydrate serving/snack + protein Space meals 4-5 hours apart Avoid sugar sweetened drinks (soda, tea, coffee, sports drinks, juices) Limit intake of fried foods and  sweets/desserts Make a dentist  appointment Get a [facility] container Return for appointment/classes on:  11-09-17   Expected Outcomes:  Demonstrated interest in learning. Expect positive outcomes  Education material provided: General meal planning guidelines, Food Group handout  If problems or questions, patient to  contact team via:  [phone]  Future DSME appointment:  11-09-17

## 2017-10-17 NOTE — Patient Instructions (Addendum)
  Check blood sugars 2 x day before breakfast and 2 hrs after supper every day and record Bring blood sugar records to the next appointment/class Exercise:  Continue riding bike/leg exercises 61min. 4x/wk. As tolerated Eat 3 meals day and   1  snack a day at bedtime Eat 3-4 carbohydrate servings/meal +  protein Eat 1 carbohydrate serving/snack + protein Space meals 4-5 hours apart Avoid sugar sweetened drinks (soda, tea, coffee, sports drinks, juices) Limit intake of fried foods and  sweets/desserts Make a dentist  appointment Get a [facility] container Return for appointment/classes on:  11-09-17

## 2017-10-30 ENCOUNTER — Telehealth: Payer: Self-pay | Admitting: Dietician

## 2017-10-30 NOTE — Telephone Encounter (Signed)
Pt called on 10-24-17 and cancelled class 3 at [facility] on 11-23-17. Rescheduled class 3 on 11-27-17 at 5:30p

## 2017-10-31 DIAGNOSIS — R3915 Urgency of urination: Secondary | ICD-10-CM | POA: Diagnosis not present

## 2017-10-31 DIAGNOSIS — G5622 Lesion of ulnar nerve, left upper limb: Secondary | ICD-10-CM | POA: Diagnosis not present

## 2017-10-31 DIAGNOSIS — N50812 Left testicular pain: Secondary | ICD-10-CM | POA: Diagnosis not present

## 2017-10-31 DIAGNOSIS — G629 Polyneuropathy, unspecified: Secondary | ICD-10-CM | POA: Diagnosis not present

## 2017-11-03 DIAGNOSIS — M545 Low back pain: Secondary | ICD-10-CM | POA: Diagnosis not present

## 2017-11-03 DIAGNOSIS — M4712 Other spondylosis with myelopathy, cervical region: Secondary | ICD-10-CM | POA: Diagnosis not present

## 2017-11-03 DIAGNOSIS — Z6829 Body mass index (BMI) 29.0-29.9, adult: Secondary | ICD-10-CM | POA: Diagnosis not present

## 2017-11-03 DIAGNOSIS — R030 Elevated blood-pressure reading, without diagnosis of hypertension: Secondary | ICD-10-CM | POA: Diagnosis not present

## 2017-11-07 ENCOUNTER — Other Ambulatory Visit: Payer: Self-pay | Admitting: Neurosurgery

## 2017-11-07 DIAGNOSIS — M545 Low back pain: Principal | ICD-10-CM

## 2017-11-07 DIAGNOSIS — G8929 Other chronic pain: Secondary | ICD-10-CM

## 2017-11-09 ENCOUNTER — Encounter: Payer: Self-pay | Admitting: Dietician

## 2017-11-09 ENCOUNTER — Encounter: Payer: PPO | Admitting: Dietician

## 2017-11-09 VITALS — Ht 73.0 in | Wt 213.4 lb

## 2017-11-09 DIAGNOSIS — Z713 Dietary counseling and surveillance: Secondary | ICD-10-CM | POA: Diagnosis not present

## 2017-11-09 DIAGNOSIS — E1140 Type 2 diabetes mellitus with diabetic neuropathy, unspecified: Secondary | ICD-10-CM

## 2017-11-09 NOTE — Progress Notes (Signed)

## 2017-11-13 ENCOUNTER — Other Ambulatory Visit: Payer: Self-pay | Admitting: Cardiovascular Disease

## 2017-11-16 ENCOUNTER — Encounter: Payer: Self-pay | Admitting: *Deleted

## 2017-11-16 ENCOUNTER — Encounter: Payer: PPO | Attending: Family Medicine | Admitting: *Deleted

## 2017-11-16 VITALS — Wt 211.5 lb

## 2017-11-16 DIAGNOSIS — E119 Type 2 diabetes mellitus without complications: Secondary | ICD-10-CM | POA: Diagnosis not present

## 2017-11-16 DIAGNOSIS — E1140 Type 2 diabetes mellitus with diabetic neuropathy, unspecified: Secondary | ICD-10-CM

## 2017-11-16 DIAGNOSIS — Z713 Dietary counseling and surveillance: Secondary | ICD-10-CM | POA: Insufficient documentation

## 2017-11-16 NOTE — Progress Notes (Signed)

## 2017-11-20 DIAGNOSIS — D2271 Melanocytic nevi of right lower limb, including hip: Secondary | ICD-10-CM | POA: Diagnosis not present

## 2017-11-20 DIAGNOSIS — D1801 Hemangioma of skin and subcutaneous tissue: Secondary | ICD-10-CM | POA: Diagnosis not present

## 2017-11-20 DIAGNOSIS — L821 Other seborrheic keratosis: Secondary | ICD-10-CM | POA: Diagnosis not present

## 2017-11-20 DIAGNOSIS — D225 Melanocytic nevi of trunk: Secondary | ICD-10-CM | POA: Diagnosis not present

## 2017-11-20 DIAGNOSIS — L900 Lichen sclerosus et atrophicus: Secondary | ICD-10-CM | POA: Diagnosis not present

## 2017-11-20 DIAGNOSIS — D2362 Other benign neoplasm of skin of left upper limb, including shoulder: Secondary | ICD-10-CM | POA: Diagnosis not present

## 2017-11-20 DIAGNOSIS — L812 Freckles: Secondary | ICD-10-CM | POA: Diagnosis not present

## 2017-11-20 DIAGNOSIS — D2361 Other benign neoplasm of skin of right upper limb, including shoulder: Secondary | ICD-10-CM | POA: Diagnosis not present

## 2017-11-20 DIAGNOSIS — Z1283 Encounter for screening for malignant neoplasm of skin: Secondary | ICD-10-CM | POA: Diagnosis not present

## 2017-11-20 DIAGNOSIS — L578 Other skin changes due to chronic exposure to nonionizing radiation: Secondary | ICD-10-CM | POA: Diagnosis not present

## 2017-11-20 DIAGNOSIS — L570 Actinic keratosis: Secondary | ICD-10-CM | POA: Diagnosis not present

## 2017-11-21 ENCOUNTER — Ambulatory Visit
Admission: RE | Admit: 2017-11-21 | Discharge: 2017-11-21 | Disposition: A | Discharge status: Home / Self Care | Payer: PPO | Source: Ambulatory Visit | Attending: Neurosurgery | Admitting: Neurosurgery

## 2017-11-21 DIAGNOSIS — G8929 Other chronic pain: Secondary | ICD-10-CM

## 2017-11-21 DIAGNOSIS — M545 Low back pain: Principal | ICD-10-CM

## 2017-11-21 DIAGNOSIS — M4807 Spinal stenosis, lumbosacral region: Secondary | ICD-10-CM | POA: Diagnosis not present

## 2017-11-21 MED ORDER — DIAZEPAM 5 MG PO TABS
5.0000 mg | ORAL_TABLET | Freq: Once | ORAL | Status: AC
  Administered 2017-11-21: 5 mg via ORAL

## 2017-11-21 MED ORDER — ONDANSETRON HCL 4 MG/2ML IJ SOLN
4.0000 mg | Freq: Once | INTRAMUSCULAR | Status: AC
  Administered 2017-11-21: 4 mg via INTRAMUSCULAR

## 2017-11-21 MED ORDER — MEPERIDINE HCL 50 MG/ML IJ SOLN
50.0000 mg | Freq: Once | INTRAMUSCULAR | Status: AC
  Administered 2017-11-21: 50 mg via INTRAMUSCULAR

## 2017-11-21 MED ORDER — IOPAMIDOL (ISOVUE-M 200) INJECTION 41%
15.0000 mL | Freq: Once | INTRAMUSCULAR | Status: AC
  Administered 2017-11-21: 15 mL via INTRATHECAL

## 2017-11-21 NOTE — Discharge Instructions (Signed)

## 2017-11-23 ENCOUNTER — Encounter: Payer: Self-pay | Admitting: Cardiovascular Disease

## 2017-11-23 ENCOUNTER — Ambulatory Visit: Payer: PPO | Admitting: Cardiovascular Disease

## 2017-11-23 VITALS — BP 132/70 | HR 75 | Ht 73.0 in | Wt 214.0 lb

## 2017-11-23 DIAGNOSIS — E782 Mixed hyperlipidemia: Secondary | ICD-10-CM

## 2017-11-23 DIAGNOSIS — I739 Peripheral vascular disease, unspecified: Secondary | ICD-10-CM

## 2017-11-23 DIAGNOSIS — I2510 Atherosclerotic heart disease of native coronary artery without angina pectoris: Secondary | ICD-10-CM | POA: Diagnosis not present

## 2017-11-23 NOTE — Patient Instructions (Signed)
Medication Instructions: No change.   Labwork: None.   Procedures/Testing: None.   Follow-Up: 6 months with Dr. [name].   Any Additional Special Instructions Will Be Listed Below (If Applicable).     If you need a refill on your cardiac medications before your next appointment, please call your pharmacy.

## 2017-11-23 NOTE — Progress Notes (Signed)
Cardiology Office Note   Date:  11/23/2017   ID:  [name], DOB March 24, 1941, MRN [id number]  PCP:  Ria Bush, MD  Cardiologist:  Dr. Rockey Situ  Chief Complaint  Patient presents with  . other    2 month follow up. Meds reviewed by the pt. verbally. "doing well."       History of Present Illness: [name] is a 77 y.o. male who is here today for a follow-up visit regarding peripheral arterial disease. The patient has known history of coronary artery disease status post CABG in 1990, PCI of the left circumflex in 2002, DVT in the left lower extremity, hyperlipidemia and back surgery for spinal stenosis in 2017.  He had hip replacement x 2. 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  He was seen a few months ago for peripheral arterial disease.  Lower extremity arterial Doppler  showed noncompressible arteries on the right side with normal toe brachial index and biphasic waveform.  Left ABI was normal with biphasic waveforms. Lower extremity arterial duplex showed evidence of bilateral SFA disease. His symptoms were felt to be multifactorial due to PAD, arthritis and severe peripheral neuropathy.  During last visit, I felt that most of his symptoms were due to peripheral neuropathy.  I increase the dose of gabapentin with some improvement in his symptoms.  Since then, he developed significant low back pain with numbness.  Testing showed new bulging disc.  He does have exertional calf pain but that does not seem to be the most limiting symptom.  Past Medical History:  Diagnosis Date  . BENIGN PROSTATIC HYPERTROPHY, WITH URINARY OBSTRUCTION 09/06/2007  . 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 Diabetes mellitus without complication ([place])    diet controled per dr note  . 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 DVT, HX OF  1998  . GERD 04/30/2007  . Headache    hx migraines  . History of hiatal hernia   . History of shingles   . HYPERLIPIDEMIA 04/26/2007  . HYPERTENSION 04/30/2007  . INGUINAL HERNIA, RIGHT 05/26/2010  . LOC OSTEOARTHROS NOT SPEC PRIM/SEC LOWER LEG 11/05/2009  . Lumbar disc disease with radiculopathy   . Myocardial infarction ([place]) 1989  . OSTEOARTHRITIS 04/26/2007  . 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 Pneumonia 07/2014   [facility] hospitalization  . PSA, INCREASED 07/09/2008  . Spinal stenosis of lumbar region     Past Surgical History:  Procedure Laterality Date  . APPENDECTOMY     rupture  . CARDIAC CATHETERIZATION [name] 07/07/2015   Procedure: Left Heart Cath and Cors/Grafts Angiography;  Surgeon: [name], MD;  Location: [facility];  Service: Cardiovascular;  Laterality: [name];  . CATARACT EXTRACTION, BILATERAL    . CERVICAL SPINE SURGERY  12/2016   cervical stenosis [name])  . COLONOSCOPY  03/2010   HP polyp, diverticulosis, rpt 10 yrs ([name])  . CORONARY ARTERY BYPASS GRAFT  1990   3 vessel   . CYSTOSCOPY  12/23/10   [facility]  . KNEE ARTHROSCOPY Right    x2  . LAMINOTOMY  1986   L5/S1 lumbar laminotomy for two ruptured discs/fusion  . LUMBAR LAMINECTOMY/DECOMPRESSION MICRODISCECTOMY [name] 07/13/2016   Procedure: LUMBAR TWO-THREE, LUMBAR THREE-FOUR, LUMBAR FOUR-FIVE LAMINECTOMY AND FORAMINOTOMY;  Surgeon: [name], MD;  Location: [facility];  Service: Neurosurgery;  Laterality: [name];  LAMINECTOMY AND FORAMINOTOMY L2-L3, L3-L4,L4-L5  . TOTAL HIP ARTHROPLASTY Bilateral 1999,2000  . TOTAL KNEE ARTHROPLASTY Right 03/06/2017   Procedure: RIGHT TOTAL KNEE ARTHROPLASTY;  Surgeon: Gaynelle Arabian, MD;  Location: [facility];  Service: Orthopedics;  Laterality: Right;     Current Outpatient Medications  Medication Sig Dispense Refill  . albuterol (PROVENTIL HFA;VENTOLIN HFA) 108 (90 Base) MCG/ACT inhaler Inhale 2 puffs into the lungs every 6 (six) hours as  needed for wheezing or shortness of breath.    Marland Kitchen aspirin EC 81 MG tablet Take 1 tablet (81 mg total) by mouth daily. 90 tablet 3  . docusate sodium (COLACE) 100 MG capsule Take 1 capsule (100 mg total) by mouth 2 (two) times daily. 60 capsule 0  . ezetimibe (ZETIA) 10 MG tablet TAKE ONE TABLET BY MOUTH EVERY DAY 90 tablet 3  . finasteride (PROSCAR) 5 MG tablet Take 5 mg by mouth daily.      Marland Kitchen gabapentin (NEURONTIN) 300 MG capsule Take 300 mg by mouth 3 (three) times daily.    . isosorbide mononitrate (IMDUR) 30 MG 24 hr tablet TAKE ONE TABLET EVERY DAY 90 tablet 1  . meloxicam (MOBIC) 15 MG tablet Take 1 tablet by mouth daily.    . methocarbamol (ROBAXIN) 500 MG tablet Take 1 tablet (500 mg total) by mouth every 6 (six) hours as needed for muscle spasms. 80 tablet 0  . nitroGLYCERIN (NITROSTAT) 0.4 MG SL tablet Place 1 tablet (0.4 mg total) under the tongue every 5 (five) minutes as needed for chest pain. 25 tablet 6  . pantoprazole (PROTONIX) 40 MG tablet Take 40 mg by mouth daily.    . simvastatin (ZOCOR) 40 MG tablet Take 1 tablet (40 mg total) by mouth at bedtime.    . tolterodine (DETROL LA) 4 MG 24 hr capsule Take 4 mg by mouth daily.     No current facility-administered medications for this visit.     Allergies:   Morphine and Vioxx [rofecoxib]    Social History:  The patient  reports that he quit smoking about 29 years ago. His smoking use included cigarettes. He has a 25.00 pack-year smoking history. He has never used smokeless tobacco. He reports that he does not drink alcohol or use drugs.   Family History:  The patient's family history includes Cancer in his other and sister; Diabetes in his brother and mother; Heart attack in his mother; Heart disease in his father; Kidney disease in his father and sister; Stroke in his father and mother.    ROS:  Please see the history of present illness.   Otherwise, review of systems are positive for none.   All other systems are reviewed and  negative.    PHYSICAL EXAM: VS:  BP 132/70 (BP Location: Left Arm, Patient Position: Sitting, Cuff Size: Normal)   Pulse 75   Ht 6\' 1"  (1.854 m)   Wt 214 lb (97.1 kg)   BMI 28.23 kg/m  , BMI Body mass index is 28.23 kg/m. GEN: Well nourished, well developed, in no acute distress  HEENT: normal  Neck: no JVD, carotid bruits, or masses Cardiac: RRR; no  rubs, or gallops,no edema .  1 out of 6 systolic ejection murmur in the aortic area Respiratory:  clear to auscultation bilaterally, normal work of breathing GI: soft, nontender, nondistended, + BS MS: no deformity or atrophy  Skin: warm and dry, no rash Neuro:  Strength and sensation are intact Psych: euthymic mood, full affect Vascular: Femoral pulses +1  on the right side with scarring related to previous cardiac catheterization, left femoral pulses normal.  Distal pulses are nonpalpable  EKG:  EKG is not ordered today.   Recent Labs: 02/27/2017: ALT 17 03/08/2017: Hemoglobin 12.0; Platelets 176 06/05/2017: BUN 17; Creatinine, Ser 1.05; Potassium 4.1; Sodium 138; TSH 4.06    Lipid Panel    Component Value Date/Time   CHOL 141 11/29/2016 1025   TRIG 128.0 11/29/2016 1025   HDL 34.90 (L) 11/29/2016 1025   CHOLHDL 4 11/29/2016 1025   VLDL 25.6 11/29/2016 1025   LDLCALC 80 11/29/2016 1025   LDLDIRECT 106.0 01/01/2016 0844      Wt Readings from Last 3 Encounters:  11/23/17 214 lb (97.1 kg)  11/16/17 211 lb 8 oz (95.9 kg)  11/09/17 213 lb 6.4 oz (96.8 kg)      No flowsheet data found.    ASSESSMENT AND PLAN:  1.  Peripheral arterial disease: The patient does have peripheral arterial disease with significant bilateral SFA disease.   He does have mild bilateral calf claudication but he seems to be mostly limited by severe right knee pain as well as new low back pain with evidence of new bulging disc.  I recommend continuing medical therapy for peripheral arterial disease.  2.  Coronary artery disease involving native  coronary arteries without angina: Continue medical therapy.  3.  Hyperlipidemia: He is tolerating simvastatin.  4.  Severe peripheral neuropathy: Continue gabapentin.    Disposition:   FU with me in 6 months  Signed,  Kathlyn Sacramento, MD  11/23/2017 9:52 AM    [place]

## 2017-11-27 ENCOUNTER — Other Ambulatory Visit: Payer: Self-pay | Admitting: Family Medicine

## 2017-11-27 ENCOUNTER — Encounter: Payer: PPO | Admitting: Dietician

## 2017-11-27 ENCOUNTER — Encounter: Payer: Self-pay | Admitting: Dietician

## 2017-11-27 VITALS — BP 160/80 | Ht 73.0 in | Wt 217.2 lb

## 2017-11-27 DIAGNOSIS — E1140 Type 2 diabetes mellitus with diabetic neuropathy, unspecified: Secondary | ICD-10-CM

## 2017-11-27 DIAGNOSIS — E119 Type 2 diabetes mellitus without complications: Secondary | ICD-10-CM

## 2017-11-27 DIAGNOSIS — E782 Mixed hyperlipidemia: Secondary | ICD-10-CM

## 2017-11-27 DIAGNOSIS — R972 Elevated prostate specific antigen [PSA]: Secondary | ICD-10-CM

## 2017-11-27 DIAGNOSIS — E538 Deficiency of other specified B group vitamins: Secondary | ICD-10-CM

## 2017-11-27 DIAGNOSIS — R7989 Other specified abnormal findings of blood chemistry: Secondary | ICD-10-CM

## 2017-11-27 DIAGNOSIS — Z713 Dietary counseling and surveillance: Secondary | ICD-10-CM | POA: Diagnosis not present

## 2017-11-27 NOTE — Progress Notes (Signed)
Appt. Start Time: 1730 Appt. End Time: 2030  Class 3 Diabetes Overview - identify functions of pancreas and insulin; define insulin deficiency vs insulin resistance  Medications - state name, dose, timing of currently prescribed medications; describe types of medications available for diabetes  Psychosocial - identify DM as a source of stress; state the effects of stress on BG control; verbalize appropriate stress management techniques; identify personal stress issues   Nutritional Management - use food labels to identify serving size, content of carbohydrate, fiber, protein, fat, saturated fat and sodium; recognize food sources of fat, saturated fat, trans fat, and sodium, and verbalize goals for intake; describe healthful, appropriate food choices when dining out   Exercise - state a plan for personal exercise; verbalize contraindications for exercise  Self-Monitoring - state importance of SMBG; use SMBG results to effectively manage diabetes; identify importance of regular HbA1C testing and goals for results  Acute Complications - recognize hyperglycemia and hypoglycemia with causes and effects; identify blood glucose results as high, low or in control; list steps in treating and preventing high and low blood glucose  Chronic Complications - state importance of daily self-foot exams; explain appropriate eye and dental care  Lifestyle Changes/Goals & Health/Community Resources - set goals for proper diabetes care; state need for and frequency of healthcare follow-up; describe appropriate community resources for good health (ADA, web sites, apps)   Teaching Materials Used: Class 3 Slide Packet Diabetes Stress Test Stress Management Tools Stress Poem Goal Setting Worksheet Website/App List    

## 2017-11-30 ENCOUNTER — Ambulatory Visit: Payer: PPO

## 2017-11-30 ENCOUNTER — Ambulatory Visit (INDEPENDENT_AMBULATORY_CARE_PROVIDER_SITE_OTHER): Payer: PPO

## 2017-11-30 VITALS — BP 126/70 | HR 67 | Temp 97.6°F | Ht 73.0 in | Wt 212.8 lb

## 2017-11-30 DIAGNOSIS — E538 Deficiency of other specified B group vitamins: Secondary | ICD-10-CM

## 2017-11-30 DIAGNOSIS — Z0000 Encounter for general adult medical examination without abnormal findings: Secondary | ICD-10-CM

## 2017-11-30 DIAGNOSIS — R972 Elevated prostate specific antigen [PSA]: Secondary | ICD-10-CM | POA: Diagnosis not present

## 2017-11-30 DIAGNOSIS — M5126 Other intervertebral disc displacement, lumbar region: Secondary | ICD-10-CM | POA: Diagnosis not present

## 2017-11-30 DIAGNOSIS — E782 Mixed hyperlipidemia: Secondary | ICD-10-CM

## 2017-11-30 DIAGNOSIS — E119 Type 2 diabetes mellitus without complications: Secondary | ICD-10-CM | POA: Diagnosis not present

## 2017-11-30 LAB — CBC WITH DIFFERENTIAL/PLATELET
Basophils Absolute: 0.1 10*3/uL (ref 0.0–0.1)
Basophils Relative: 1.3 % (ref 0.0–3.0)
Eosinophils Absolute: 0.3 10*3/uL (ref 0.0–0.7)
Eosinophils Relative: 5.7 % — ABNORMAL HIGH (ref 0.0–5.0)
HCT: 42.3 % (ref 39.0–52.0)
Hemoglobin: 14.7 g/dL (ref 13.0–17.0)
Lymphocytes Relative: 26.1 % (ref 12.0–46.0)
Lymphs Abs: 1.3 10*3/uL (ref 0.7–4.0)
MCHC: 34.8 g/dL (ref 30.0–36.0)
MCV: 92 fl (ref 78.0–100.0)
Monocytes Absolute: 0.5 10*3/uL (ref 0.1–1.0)
Monocytes Relative: 10.4 % (ref 3.0–12.0)
Neutro Abs: 2.8 10*3/uL (ref 1.4–7.7)
Neutrophils Relative %: 56.5 % (ref 43.0–77.0)
Platelets: 191 10*3/uL (ref 150.0–400.0)
RBC: 4.59 Mil/uL (ref 4.22–5.81)
RDW: 12.8 % (ref 11.5–15.5)
WBC: 4.9 10*3/uL (ref 4.0–10.5)

## 2017-11-30 LAB — LIPID PANEL
Cholesterol: 127 mg/dL (ref 0–200)
HDL: 37.5 mg/dL — ABNORMAL LOW (ref >39.00)
LDL Cholesterol: 73 mg/dL (ref 0–99)
NonHDL: 89.67
Total CHOL/HDL Ratio: 3
Triglycerides: 84 mg/dL (ref 0.0–149.0)
VLDL: 16.8 mg/dL (ref 0.0–40.0)

## 2017-11-30 LAB — COMPREHENSIVE METABOLIC PANEL
ALT: 13 U/L (ref 0–53)
AST: 16 U/L (ref 0–37)
Albumin: 4.3 g/dL (ref 3.5–5.2)
Alkaline Phosphatase: 35 U/L — ABNORMAL LOW (ref 39–117)
BUN: 22 mg/dL (ref 6–23)
CO2: 27 mEq/L (ref 19–32)
Calcium: 9.5 mg/dL (ref 8.4–10.5)
Chloride: 105 mEq/L (ref 96–112)
Creatinine, Ser: 1.09 mg/dL (ref 0.40–1.50)
GFR: 69.78 mL/min (ref >60.00)
Glucose, Bld: 110 mg/dL — ABNORMAL HIGH (ref 70–99)
Potassium: 4.1 mEq/L (ref 3.5–5.1)
Sodium: 139 mEq/L (ref 135–145)
Total Bilirubin: 0.7 mg/dL (ref 0.2–1.2)
Total Protein: 6.2 g/dL (ref 6.0–8.3)

## 2017-11-30 LAB — HEMOGLOBIN A1C: Hgb A1c MFr Bld: 6.4 % (ref 4.6–6.5)

## 2017-11-30 LAB — PSA: PSA: 0.21 ng/mL (ref 0.10–4.00)

## 2017-11-30 LAB — VITAMIN B12: Vitamin B-12: 417 pg/mL (ref 211–911)

## 2017-11-30 NOTE — Progress Notes (Signed)
PCP notes:   Health maintenance:  A1C - completed Microalbumin - completed  Abnormal screenings:   Hearing - failed  Hearing Screening   125Hz  250Hz  500Hz  1000Hz  2000Hz  3000Hz  4000Hz  6000Hz  8000Hz   Right ear:   40 40 40  0    Left ear:   40 40 40  0     Patient concerns:   Chronic back and knee pain  Nurse concerns:  None  Next PCP appt:   12/05/17 @ 0930

## 2017-11-30 NOTE — Progress Notes (Signed)
Subjective:   [name] is a 77 y.o. male who presents for Medicare Annual/Subsequent preventive examination.  Review of Systems:  [name] Cardiac Risk Factors include: advanced age (>62men, >31 women);dyslipidemia;hypertension;male gender     Objective:    Vitals: BP 126/70 (BP Location: Right Arm, Patient Position: Sitting, Cuff Size: Normal)   Pulse 67   Temp 97.6 F (36.4 C) (Oral)   Ht 6\' 1"  (1.854 m) Comment: shoes  Wt 212 lb 12 oz (96.5 kg)   SpO2 97%   BMI 28.07 kg/m   Body mass index is 28.07 kg/m.  Advanced Directives 11/30/2017 10/17/2017 08/22/2017 03/27/2017 03/06/2017 02/27/2017 11/29/2016  Does Patient Have a Medical Advance Directive? No No No No No No No  Does patient want to make changes to medical advance directive? No - Patient declined - - - - - -  Would patient like information on creating a medical advance directive? - No - Patient declined No - Patient declined No - Patient declined No - Patient declined No - Patient declined -    Tobacco Social History   Tobacco Use  Smoking Status Former Smoker  . Packs/day: 1.00  . Years: 25.00  . Pack years: 25.00  . Types: Cigarettes  . Last attempt to quit: 08/15/1988  . Years since quitting: 29.3  Smokeless Tobacco Never Used     Counseling given: No   Clinical Intake:  Pre-visit preparation completed: Yes  Pain : 0-10 Pain Score: 5  Pain Type: Chronic pain Pain Location: Back(knee) Pain Orientation: Right, Lower Pain Onset: More than a month ago Pain Frequency: Constant     Nutritional Status: BMI 25 -29 Overweight Nutritional Risks: None Diabetes: No  How often do you need to have someone help you when you read instructions, pamphlets, or other written materials from your doctor or pharmacy?: 1 - Never What is the last grade level you completed in school?: 12th grade  Interpreter Needed?: No  Comments: pt lives with spouse Information entered by :: LPinson, LPN  Past Medical History:    Diagnosis Date  . BENIGN PROSTATIC HYPERTROPHY, WITH URINARY OBSTRUCTION 09/06/2007  . 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 Diabetes mellitus without complication ([place])    diet controled per dr note  . 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 DVT, HX OF 1998  . GERD 04/30/2007  . Headache    hx migraines  . History of hiatal hernia   . History of shingles   . HYPERLIPIDEMIA 04/26/2007  . HYPERTENSION 04/30/2007  . INGUINAL HERNIA, RIGHT 05/26/2010  . LOC OSTEOARTHROS NOT SPEC PRIM/SEC LOWER LEG 11/05/2009  . Lumbar disc disease with radiculopathy   . Myocardial infarction ([place]) 1989  . OSTEOARTHRITIS 04/26/2007  . 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 Pneumonia 07/2014   [facility] hospitalization  . PSA, INCREASED 07/09/2008  . Spinal stenosis of lumbar region    Past Surgical History:  Procedure Laterality Date  . APPENDECTOMY     rupture  . CARDIAC CATHETERIZATION [name] 07/07/2015   Procedure: Left Heart Cath and Cors/Grafts Angiography;  Surgeon: [name], MD;  Location: [facility];  Service: Cardiovascular;  Laterality: [name];  . CATARACT EXTRACTION, BILATERAL    . CERVICAL SPINE SURGERY  12/2016   cervical stenosis [name])  . COLONOSCOPY  03/2010   HP polyp, diverticulosis, rpt 10 yrs ([name])  . [place]  3 vessel   . CYSTOSCOPY  12/23/10   [facility]  . KNEE ARTHROSCOPY Right    x2  . LAMINOTOMY  1986   L5/S1 lumbar laminotomy for two ruptured discs/fusion  . LUMBAR LAMINECTOMY/DECOMPRESSION MICRODISCECTOMY [name] 07/13/2016   Procedure: LUMBAR TWO-THREE, LUMBAR THREE-FOUR, LUMBAR FOUR-FIVE LAMINECTOMY AND FORAMINOTOMY;  Surgeon: [name], MD;  Location: [facility];  Service: Neurosurgery;  Laterality: [name];  LAMINECTOMY AND FORAMINOTOMY L2-L3, L3-L4,L4-L5  . TOTAL HIP ARTHROPLASTY Bilateral 1999,2000  . TOTAL KNEE ARTHROPLASTY Right  03/06/2017   Procedure: RIGHT TOTAL KNEE ARTHROPLASTY;  Surgeon: Gaynelle Arabian, MD;  Location: [facility];  Service: Orthopedics;  Laterality: Right;   Family History  Problem Relation Age of Onset  . Stroke Mother   . Heart attack Mother   . Diabetes Mother   . Stroke Father   . Heart disease Father   . Kidney disease Father        PCKD  . Cancer Sister        throat  . Diabetes Brother   . Kidney disease Sister        PCKD  . Cancer Other        5/7 nephews with lung cancer   Social History   Socioeconomic History  . Marital status: Married    Spouse name: Not on file  . Number of children: Not on file  . Years of education: Not on file  . Highest education level: Not on file  Occupational History  . Not on file  Social Needs  . Financial resource strain: Not on file  . Food insecurity:    Worry: Not on file    Inability: Not on file  . Transportation needs:    Medical: Not on file    Non-medical: Not on file  Tobacco Use  . Smoking status: Former Smoker    Packs/day: 1.00    Years: 25.00    Pack years: 25.00    Types: Cigarettes    Last attempt to quit: 08/15/1988    Years since quitting: 29.3  . Smokeless tobacco: Never Used  Substance and Sexual Activity  . Alcohol use: No    Alcohol/week: 0.0 oz  . Drug use: No  . Sexual activity: Yes  Lifestyle  . Physical activity:    Days per week: Not on file    Minutes per session: Not on file  . Stress: Not on file  Relationships  . Social connections:    Talks on phone: Not on file    Gets together: Not on file    Attends religious service: Not on file    Active member of club or organization: Not on file    Attends meetings of clubs or organizations: Not on file    Relationship status: Not on file  Other Topics Concern  . Not on file  Social History Narrative   Married, wife [name], for 25 years in 2016. 1 daughter and 3 grandkids from first marriage.    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    Diet: good water, fruits/vegetables daily    Outpatient Encounter Medications as of 11/30/2017  Medication Sig  . albuterol (PROVENTIL HFA;VENTOLIN HFA) 108 (90 Base) MCG/ACT inhaler Inhale 2 puffs into the lungs every 6 (six) hours as needed for wheezing or shortness of breath.  Marland Kitchen aspirin EC 81 MG tablet  Take 1 tablet (81 mg total) by mouth daily.  Marland Kitchen docusate sodium (COLACE) 100 MG capsule Take 1 capsule (100 mg total) by mouth 2 (two) times daily.  Marland Kitchen ezetimibe (ZETIA) 10 MG tablet TAKE ONE TABLET BY MOUTH EVERY DAY  . finasteride (PROSCAR) 5 MG tablet Take 5 mg by mouth daily.    Marland Kitchen gabapentin (NEURONTIN) 300 MG capsule Take 300 mg by mouth 3 (three) times daily.  Marland Kitchen HYDROcodone-acetaminophen (NORCO/VICODIN) 5-325 MG tablet   . isosorbide mononitrate (IMDUR) 30 MG 24 hr tablet TAKE ONE TABLET EVERY DAY  . nitroGLYCERIN (NITROSTAT) 0.4 MG SL tablet Place 1 tablet (0.4 mg total) under the tongue every 5 (five) minutes as needed for chest pain.  . pantoprazole (PROTONIX) 40 MG tablet Take 40 mg by mouth daily.  . simvastatin (ZOCOR) 40 MG tablet Take 1 tablet (40 mg total) by mouth at bedtime.  . tolterodine (DETROL LA) 4 MG 24 hr capsule Take 4 mg by mouth daily.  . [DISCONTINUED] meloxicam (MOBIC) 15 MG tablet Take 1 tablet by mouth daily.  . [DISCONTINUED] methocarbamol (ROBAXIN) 500 MG tablet Take 1 tablet (500 mg total) by mouth every 6 (six) hours as needed for muscle spasms.   No facility-administered encounter medications on file as of 11/30/2017.     Activities of Daily Living In your present state of health, do you have any difficulty performing the following activities: 11/30/2017 03/06/2017  Hearing? N N  Vision? N N  Difficulty concentrating or making decisions? N N  Walking or climbing stairs? Y Y  Dressing or bathing? N N  Doing errands, shopping?  N N  Preparing Food and eating ? N -  Using the Toilet? N -  In the past six months, have you accidently leaked urine? N -  Do you have problems with loss of bowel control? N -  Managing your Medications? N -  Managing your Finances? N -  Housekeeping or managing your Housekeeping? N -  Some recent data might be hidden    Patient Care Team: Ria Bush, MD as PCP - General (Family Medicine)   Assessment:   This is a routine wellness examination for Eh.  Exercise Activities and Dietary recommendations Current Exercise Habits: The patient does not participate in regular exercise at present, Exercise limited by: orthopedic condition(s)(back and knee pain)  Goals    . Follow up with Primary Care Provider     Starting 11/30/2017, I will continue to take medications as prescribed and to keep appointments with PCP as scheduled.        Fall Risk Fall Risk  11/30/2017 11/27/2017 11/16/2017 10/17/2017 11/29/2016  Falls in the past year? No No No No No  Number falls in past yr: - - - - -  Comment - - - - -  Injury with Fall? - - - - -   Depression Screen PHQ 2/9 Scores 11/30/2017 10/17/2017 08/22/2017 11/29/2016  PHQ - 2 Score 0 0 0 0  PHQ- 9 Score 0 - - -    Cognitive Function MMSE - Mini Mental State Exam 11/30/2017 11/29/2016  Orientation to time 5 5  Orientation to Place 5 5  Registration 3 3  Attention/ Calculation 0 0  Recall 3 3  Language- name 2 objects 0 0  Language- repeat 1 1  Language- follow 3 step command 3 3  Language- read & follow direction 0 0  Write a sentence 0 0  Copy design 0 0  Total score 20 20  PLEASE NOTE: A Mini-Cog screen was completed. Maximum score is 20. A value of 0 denotes this part of Folstein MMSE was not completed or the patient failed this part of the Mini-Cog screening.   Mini-Cog Screening Orientation to Time - Max 5 pts Orientation to Place - Max 5 pts Registration - Max 3 pts Recall - Max 3 pts Language Repeat - Max 1  pts Language Follow 3 Step Command - Max 3 pts     Immunization History  Administered Date(s) Administered  . Influenza Whole 08/15/2000, 07/04/2007, 05/09/2008, 05/31/2010  . Influenza, High Dose Seasonal PF 05/15/2013, 06/25/2015  . Influenza,inj,Quad PF,6+ Mos 04/17/2014, 05/18/2016  . Influenza-Unspecified 05/22/2017  . Pneumococcal Conjugate-13 02/09/2015  . Pneumococcal Polysaccharide-23 08/15/2000, 10/29/2008  . Td 08/15/2005  . Zoster 02/04/2009  . Zoster Recombinat (Shingrix) 04/05/2017, 07/25/2017   Screening Tests Health Maintenance  Topic Date Due  . DTaP/Tdap/Td (1 - Tdap) 08/14/2025 (Originally 08/16/2005)  . TETANUS/TDAP  08/14/2025 (Originally 08/16/2015)  . INFLUENZA VACCINE  03/15/2018  . HEMOGLOBIN A1C  06/01/2018  . FOOT EXAM  06/05/2018  . OPHTHALMOLOGY EXAM  07/15/2018  . URINE MICROALBUMIN  12/01/2018  . PNA vac Low Risk Adult  Completed     Plan:     I have personally reviewed, addressed, and noted the following in the patient's chart:  A. Medical and social history B. Use of alcohol, tobacco or illicit drugs  C. Current medications and supplements D. Functional ability and status E.  Nutritional status F.  Physical activity G. Advance directives H. List of other physicians I.  Hospitalizations, surgeries, and ER visits in previous 12 months J.  [place] to include hearing, vision, cognitive, depression L. Referrals and appointments - none  In addition, I have reviewed and discussed with patient certain preventive protocols, quality metrics, and best practice recommendations. A written personalized care plan for preventive services as well as general preventive health recommendations were provided to patient.  See attached scanned questionnaire for additional information.   Signed,   Lindell Noe, MHA, BS, LPN Health Coach

## 2017-11-30 NOTE — Patient Instructions (Signed)
[name] , Thank you for taking time to come for your Medicare Wellness Visit. I appreciate your ongoing commitment to your health goals. Please review the following plan we discussed and let me know if I can assist you in the future.   These are the goals we discussed: Goals    . Follow up with Primary Care Provider     Starting 11/30/2017, I will continue to take medications as prescribed and to keep appointments with PCP as scheduled.        This is a list of the screening recommended for you and due dates:  Health Maintenance  Topic Date Due  . DTaP/Tdap/Td vaccine (1 - Tdap) 08/14/2025*  . Tetanus Vaccine  08/14/2025*  . Flu Shot  03/15/2018  . Hemoglobin A1C  06/01/2018  . Complete foot exam   06/05/2018  . Eye exam for diabetics  07/15/2018  . Urine Protein Check  12/01/2018  . Pneumonia vaccines  Completed  *Topic was postponed. The date shown is not the original due date.   Preventive Care for Adults  A healthy lifestyle and preventive care can promote health and wellness. Preventive health guidelines for adults include the following key practices.  . A routine yearly physical is a good way to check with your health care provider about your health and preventive screening. It is a chance to share any concerns and updates on your health and to receive a thorough exam.  . Visit your dentist for a routine exam and preventive care every 6 months. Brush your teeth twice a day and floss once a day. Good oral hygiene prevents tooth decay and gum disease.  . The frequency of eye exams is based on your age, health, family medical history, use  of contact lenses, and other factors. Follow your health care provider's recommendations for frequency of eye exams.  . Eat a healthy diet. Foods like vegetables, fruits, whole grains, low-fat dairy products, and lean protein foods contain the nutrients you need without too many calories. Decrease your intake of foods high in solid fats,  added sugars, and salt. Eat the right amount of calories for you. Get information about a proper diet from your health care provider, if necessary.  . Regular physical exercise is one of the most important things you can do for your health. Most adults should get at least 150 minutes of moderate-intensity exercise (any activity that increases your heart rate and causes you to sweat) each week. In addition, most adults need muscle-strengthening exercises on 2 or more days a week.  Silver Sneakers may be a benefit available to you. To determine eligibility, you may visit the website: www.silversneakers.com or contact program at 320-225-6433 Mon-Fri between 8AM-8PM.   . Maintain a healthy weight. The body mass index (BMI) is a screening tool to identify possible weight problems. It provides an estimate of body fat based on height and weight. Your health care provider can find your BMI and can help you achieve or maintain a healthy weight.   For adults 20 years and older: ? A BMI below 18.5 is considered underweight. ? A BMI of 18.5 to 24.9 is normal. ? A BMI of 25 to 29.9 is considered overweight. ? A BMI of 30 and above is considered obese.   . Maintain normal blood lipids and cholesterol levels by exercising and minimizing your intake of saturated fat. Eat a balanced diet with plenty of fruit and vegetables. Blood tests for lipids and cholesterol should begin at  age 41 and be repeated every 5 years. If your lipid or cholesterol levels are high, you are over 50, or you are at high risk for heart disease, you may need your cholesterol levels checked more frequently. Ongoing high lipid and cholesterol levels should be treated with medicines if diet and exercise are not working.  . If you smoke, find out from your health care provider how to quit. If you do not use tobacco, please do not start.  . If you choose to drink alcohol, please do not consume more than 2 drinks per day. One drink is  considered to be 12 ounces (355 mL) of beer, 5 ounces (148 mL) of wine, or 1.5 ounces (44 mL) of liquor.  . If you are 61-37 years old, ask your health care provider if you should take aspirin to prevent strokes.  . Use sunscreen. Apply sunscreen liberally and repeatedly throughout the day. You should seek shade when your shadow is shorter than you. Protect yourself by wearing long sleeves, pants, a wide-brimmed hat, and sunglasses year round, whenever you are outdoors.  . Once a month, do a whole body skin exam, using a mirror to look at the skin on your back. Tell your health care provider of new moles, moles that have irregular borders, moles that are larger than a pencil eraser, or moles that have changed in shape or color.

## 2017-12-04 NOTE — Progress Notes (Signed)
I reviewed health advisor's note, was available for consultation, and agree with documentation and plan.  

## 2017-12-05 ENCOUNTER — Ambulatory Visit (INDEPENDENT_AMBULATORY_CARE_PROVIDER_SITE_OTHER): Payer: PPO | Admitting: Family Medicine

## 2017-12-05 ENCOUNTER — Encounter: Payer: Self-pay | Admitting: Family Medicine

## 2017-12-05 VITALS — BP 126/72 | HR 70 | Temp 97.6°F | Ht 73.0 in | Wt 214.0 lb

## 2017-12-05 DIAGNOSIS — Z7189 Other specified counseling: Secondary | ICD-10-CM | POA: Diagnosis not present

## 2017-12-05 DIAGNOSIS — R7303 Prediabetes: Secondary | ICD-10-CM | POA: Diagnosis not present

## 2017-12-05 DIAGNOSIS — E663 Overweight: Secondary | ICD-10-CM | POA: Diagnosis not present

## 2017-12-05 DIAGNOSIS — Z0000 Encounter for general adult medical examination without abnormal findings: Secondary | ICD-10-CM | POA: Diagnosis not present

## 2017-12-05 DIAGNOSIS — I10 Essential (primary) hypertension: Secondary | ICD-10-CM

## 2017-12-05 DIAGNOSIS — M48062 Spinal stenosis, lumbar region with neurogenic claudication: Secondary | ICD-10-CM

## 2017-12-05 DIAGNOSIS — M5116 Intervertebral disc disorders with radiculopathy, lumbar region: Secondary | ICD-10-CM

## 2017-12-05 DIAGNOSIS — E538 Deficiency of other specified B group vitamins: Secondary | ICD-10-CM

## 2017-12-05 DIAGNOSIS — E782 Mixed hyperlipidemia: Secondary | ICD-10-CM | POA: Diagnosis not present

## 2017-12-05 DIAGNOSIS — I739 Peripheral vascular disease, unspecified: Secondary | ICD-10-CM

## 2017-12-05 NOTE — Assessment & Plan Note (Signed)
Discussing options with surgery - considering repeat lumbar surgery. Suggested try steroid injection first.

## 2017-12-05 NOTE — Assessment & Plan Note (Addendum)
Completed diabetes education, healthy diet changes and weight loss have helped control diabetes. Now in prediabetes range. Congratulated. Feels changes are sustainable.

## 2017-12-05 NOTE — Assessment & Plan Note (Signed)
b12 level stable off replacement - will need to continue to monitor.

## 2017-12-05 NOTE — Assessment & Plan Note (Signed)
Chronic, stable on current regimen. Continue. The ASCVD Risk score Mikey Bussing DC Jr., et al., 2013) failed to calculate for the following reasons:   The valid total cholesterol range is 130 to 320 mg/dL

## 2017-12-05 NOTE — Assessment & Plan Note (Signed)
Congratulated on weight loss to date through healthy diet changes. Pt motivated to continue sustainable low sugar diet.

## 2017-12-05 NOTE — Assessment & Plan Note (Signed)
Chronic, stable. Continue current regimen. 

## 2017-12-05 NOTE — Progress Notes (Signed)
BP 126/72 (BP Location: Left Arm, Patient Position: Sitting, Cuff Size: Normal)   Pulse 70   Temp 97.6 F (36.4 C) (Oral)   Ht 6\' 1"  (1.854 m)   Wt 214 lb (97.1 kg)   SpO2 94%   BMI 28.23 kg/m    CC: CPE Subjective:    Patient ID: [name], male    DOB: 1941/05/01, 77 y.o.   MRN: [id number]  HPI: [name] is a 77 y.o. male presenting on 12/05/2017 for Annual Exam (Pt 2.)   Here with wife Princess Perna last week for medicare wellness visit. Note reviewed. Ongoing OA pain. H/o lumbar laminectomy 06/2016 [name]). Also had R knee replacement 02/2017 and cervical spine surgery 12/2016. Now with worsening lumbar pain, reviewed recent CT myelogram 11/2017.  DM - went to diabetes education - and felt this was helpful. Has drastically changed diet Known PAD - followed by Dr [name].  HLD - now on zetia and simvastatin  Preventative: Colon cancer screening - colonoscopy 03/2010 HP polyp, diverticulosis, rpt 10 yrs [facility])  Prostate cancer screening - sees Dr Karsten Ro yearly  Lung cancer screening - not eligible  Flu shot - yearly  Td 2007  Pneumovax 10/2008, prevnar 01/2015 zostavax 2010.  shingrix - 2018 completed series Advanced directive discussion - needs updating. Wife is HCPOA. has packet at home.  Seat belt use discussed  Sunscreen use discussed. No changing moles on skin.  Ex smoker - quit 1990 Alcohol - none  Married, wife [name], for 25 years in 2016. 1 daughter and 3 grandkids from first marriage. 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  Diet: good water, fruits/vegetables daily   Relevant past medical, surgical, family and social history reviewed and updated as indicated. Interim medical history since our last visit reviewed. Allergies and medications reviewed and updated. Outpatient Medications Prior to Visit    Medication Sig Dispense Refill  . albuterol (PROVENTIL HFA;VENTOLIN HFA) 108 (90 Base) MCG/ACT inhaler Inhale 2 puffs into the lungs every 6 (six) hours as needed for wheezing or shortness of breath.    Marland Kitchen aspirin EC 81 MG tablet Take 1 tablet (81 mg total) by mouth daily. 90 tablet 3  . docusate sodium (COLACE) 100 MG capsule Take 1 capsule (100 mg total) by mouth 2 (two) times daily. 60 capsule 0  . ezetimibe (ZETIA) 10 MG tablet TAKE ONE TABLET BY MOUTH EVERY DAY 90 tablet 3  . finasteride (PROSCAR) 5 MG tablet Take 5 mg by mouth daily.      Marland Kitchen gabapentin (NEURONTIN) 300 MG capsule Take 300 mg by mouth 3 (three) times daily.    Marland Kitchen HYDROcodone-acetaminophen (NORCO/VICODIN) 5-325 MG tablet     . isosorbide mononitrate (IMDUR) 30 MG 24 hr tablet TAKE ONE TABLET EVERY DAY 90 tablet 1  . nitroGLYCERIN (NITROSTAT) 0.4 MG SL tablet Place 1 tablet (0.4 mg total) under the tongue every 5 (five) minutes as needed for chest pain. 25 tablet 6  . pantoprazole (PROTONIX) 40 MG tablet Take 40 mg by mouth daily.    . simvastatin (ZOCOR) 40 MG tablet Take 1 tablet (40 mg total) by mouth at bedtime.    . tolterodine (DETROL LA) 4 MG 24 hr capsule Take 4 mg by mouth daily.    Marland Kitchen acetaminophen (TYLENOL) 500 MG tablet Take 2 tablets (1,000 mg total) by mouth  as needed for moderate pain.     No facility-administered medications prior to visit.      Per HPI unless specifically indicated in ROS section below Review of Systems  Constitutional: Negative for activity change, appetite change, chills, fatigue, fever and unexpected weight change.  HENT: Negative for hearing loss.   Eyes: Negative for visual disturbance.  Respiratory: Negative for cough, chest tightness, shortness of breath and wheezing.   Cardiovascular: Positive for leg swelling. Negative for chest pain and palpitations.  Gastrointestinal: Negative for abdominal distention, abdominal pain, blood in stool, constipation, diarrhea, nausea and vomiting.   Genitourinary: Negative for difficulty urinating and hematuria.  Musculoskeletal: Negative for arthralgias, myalgias and neck pain.  Skin: Negative for rash.  Neurological: Negative for dizziness, seizures, syncope and headaches.  Hematological: Negative for adenopathy. Bruises/bleeds easily.  Psychiatric/Behavioral: Negative for dysphoric mood. The patient is not nervous/anxious.        Objective:    BP 126/72 (BP Location: Left Arm, Patient Position: Sitting, Cuff Size: Normal)   Pulse 70   Temp 97.6 F (36.4 C) (Oral)   Ht 6\' 1"  (1.854 m)   Wt 214 lb (97.1 kg)   SpO2 94%   BMI 28.23 kg/m   Wt Readings from Last 3 Encounters:  12/05/17 214 lb (97.1 kg)  11/30/17 212 lb 12 oz (96.5 kg)  11/27/17 217 lb 3.2 oz (98.5 kg)    Physical Exam  Constitutional: He is oriented to person, place, and time. He appears well-developed and well-nourished. No distress.  HENT:  Head: Normocephalic and atraumatic.  Right Ear: Hearing, tympanic membrane, external ear and ear canal normal.  Left Ear: Hearing, tympanic membrane, external ear and ear canal normal.  Nose: Nose normal.  Mouth/Throat: Uvula is midline, oropharynx is clear and moist and mucous membranes are normal. No oropharyngeal exudate, posterior oropharyngeal edema or posterior oropharyngeal erythema.  Eyes: Pupils are equal, round, and reactive to light. Conjunctivae and EOM are normal. No scleral icterus.  Neck: Normal range of motion. Neck supple. Carotid bruit is present (mild). No thyromegaly present.  Cardiovascular: Normal rate, regular rhythm and intact distal pulses.  Murmur (2/6 systolic) heard. Pulses:      Radial pulses are 2+ on the right side, and 2+ on the left side.  Pulmonary/Chest: Effort normal and breath sounds normal. No respiratory distress. He has no wheezes. He has no rales.  Abdominal: Soft. Bowel sounds are normal. He exhibits no distension and no mass. There is no tenderness. There is no rebound and no  guarding. No hernia. Hernia confirmed negative in the right inguinal area and confirmed negative in the left inguinal area.  Musculoskeletal: Normal range of motion. He exhibits no edema.  Lymphadenopathy:    He has no cervical adenopathy.  Neurological: He is alert and oriented to person, place, and time.  CN grossly intact, station and gait intact  Skin: Skin is warm and dry. No rash noted.  Psychiatric: He has a normal mood and affect. His behavior is normal. Judgment and thought content normal.  Nursing note and vitals reviewed.  Results for orders placed or performed in visit on 11/30/17  Microalbumin / creatinine urine ratio  Result Value Ref Range   Microalb, Ur 0.2 0.0 - 1.9 mg/dL   Creatinine,U 44.9 mg/dL   Microalb Creat Ratio 0.4 0.0 - 30.0 mg/g  CBC with Differential/Platelet  Result Value Ref Range   WBC 4.9 4.0 - 10.5 K/uL   RBC 4.59 4.22 - 5.81 Mil/uL  Hemoglobin 14.7 13.0 - 17.0 g/dL   HCT 42.3 39.0 - 52.0 %   MCV 92.0 78.0 - 100.0 fl   MCHC 34.8 30.0 - 36.0 g/dL   RDW 12.8 11.5 - 15.5 %   Platelets 191.0 150.0 - 400.0 K/uL   Neutrophils Relative % 56.5 43.0 - 77.0 %   Lymphocytes Relative 26.1 12.0 - 46.0 %   Monocytes Relative 10.4 3.0 - 12.0 %   Eosinophils Relative 5.7 (H) 0.0 - 5.0 %   Basophils Relative 1.3 0.0 - 3.0 %   Neutro Abs 2.8 1.4 - 7.7 K/uL   Lymphs Abs 1.3 0.7 - 4.0 K/uL   Monocytes Absolute 0.5 0.1 - 1.0 K/uL   Eosinophils Absolute 0.3 0.0 - 0.7 K/uL   Basophils Absolute 0.1 0.0 - 0.1 K/uL  Vitamin B12  Result Value Ref Range   Vitamin B-12 417 211 - 911 pg/mL  PSA  Result Value Ref Range   PSA 0.21 0.10 - 4.00 ng/mL  Hemoglobin A1c  Result Value Ref Range   Hgb A1c MFr Bld 6.4 4.6 - 6.5 %  Comprehensive metabolic panel  Result Value Ref Range   Sodium 139 135 - 145 mEq/L   Potassium 4.1 3.5 - 5.1 mEq/L   Chloride 105 96 - 112 mEq/L   CO2 27 19 - 32 mEq/L   Glucose, Bld 110 (H) 70 - 99 mg/dL   BUN 22 6 - 23 mg/dL   Creatinine,  Ser 1.09 0.40 - 1.50 mg/dL   Total Bilirubin 0.7 0.2 - 1.2 mg/dL   Alkaline Phosphatase 35 (L) 39 - 117 U/L   AST 16 0 - 37 U/L   ALT 13 0 - 53 U/L   Total Protein 6.2 6.0 - 8.3 g/dL   Albumin 4.3 3.5 - 5.2 g/dL   Calcium 9.5 8.4 - 10.5 mg/dL   GFR 69.78 >60.00 mL/min  Lipid panel  Result Value Ref Range   Cholesterol 127 0 - 200 mg/dL   Triglycerides 84.0 0.0 - 149.0 mg/dL   HDL 37.50 (L) >39.00 mg/dL   VLDL 16.8 0.0 - 40.0 mg/dL   LDL Cholesterol 73 0 - 99 mg/dL   Total CHOL/HDL Ratio 3    NonHDL 89.67       Assessment & Plan:   Problem List Items Addressed This Visit    Advanced care planning/counseling discussion    Advanced directive discussion - needs updating. Wife is HCPOA. has packet at home.       Essential hypertension    Chronic, stable. Continue current regimen.       Health maintenance examination - Primary    .      Hyperlipidemia    Chronic, stable on current regimen. Continue. The ASCVD Risk score Mikey Bussing DC Jr., et al., 2013) failed to calculate for the following reasons:   The valid total cholesterol range is 130 to 320 mg/dL       Low vitamin B12 level    b12 level stable off replacement - will need to continue to monitor.       Lumbar disc disease with radiculopathy    Discussing options with surgery - considering repeat lumbar surgery. Suggested try steroid injection first.       Lumbar stenosis with neurogenic claudication   Relevant Medications   acetaminophen (TYLENOL) 500 MG tablet   Overweight (BMI 25.0-29.9)    Congratulated on weight loss to date through healthy diet changes. Pt motivated to continue sustainable low sugar diet.  Prediabetes    Completed diabetes education, healthy diet changes and weight loss have helped control diabetes. Now in prediabetes range. Congratulated. Feels changes are sustainable.       PVD (peripheral vascular disease) with claudication ([place])    Followed by cards. Continue aspirin daily. Seems no  longer on xarelto.           No orders of the defined types were placed in this encounter.  No orders of the defined types were placed in this encounter.   Follow up plan: Return in about 6 months (around 06/06/2018) for follow up visit.  Ria Bush, MD

## 2017-12-05 NOTE — Assessment & Plan Note (Addendum)
Followed by cards. Continue aspirin daily. Seems no longer on xarelto.

## 2017-12-05 NOTE — Patient Instructions (Addendum)
Work on TEFL teacher.  Congratulations on healthy changes and improved cholesterol, sugar. Continue current regimen.  Good to see you today, call us with questions.   Health Maintenance, Male A healthy lifestyle and preventive care is important for your health and wellness. Ask your health care provider about what schedule of regular examinations is right for you. What should I know about weight and diet? Eat a Healthy Diet  Eat plenty of vegetables, fruits, whole grains, low-fat dairy products, and lean protein.  Do not eat a lot of foods high in solid fats, added sugars, or salt.  Maintain a Healthy Weight Regular exercise can help you achieve or maintain a healthy weight. You should:  Do at least 150 minutes of exercise each week. The exercise should increase your heart rate and make you sweat (moderate-intensity exercise).  Do strength-training exercises at least twice a week.  Watch Your Levels of Cholesterol and Blood Lipids  Have your blood tested for lipids and cholesterol every 5 years starting at 77 years of age. If you are at high risk for heart disease, you should start having your blood tested when you are 77 years old. You may need to have your cholesterol levels checked more often if: ? Your lipid or cholesterol levels are high. ? You are older than 77 years of age. ? You are at high risk for heart disease.  What should I know about cancer screening? Many types of cancers can be detected early and may often be prevented. Lung Cancer  You should be screened every year for lung cancer if: ? You are a current smoker who has smoked for at least 30 years. ? You are a former smoker who has quit within the past 15 years.  Talk to your health care provider about your screening options, when you should start screening, and how often you should be screened.  Colorectal Cancer  Routine colorectal cancer screening usually begins at 77 years of age and should  be repeated every 5-10 years until you are 77 years old. You may need to be screened more often if early forms of precancerous polyps or small growths are found. Your health care provider may recommend screening at an earlier age if you have risk factors for colon cancer.  Your health care provider may recommend using home test kits to check for hidden blood in the stool.  A small camera at the end of a tube can be used to examine your colon (sigmoidoscopy or colonoscopy). This checks for the earliest forms of colorectal cancer.  Prostate and Testicular Cancer  Depending on your age and overall health, your health care provider may do certain tests to screen for prostate and testicular cancer.  Talk to your health care provider about any symptoms or concerns you have about testicular or prostate cancer.  Skin Cancer  Check your skin from head to toe regularly.  Tell your health care provider about any new moles or changes in moles, especially if: ? There is a change in a mole's size, shape, or color. ? You have a mole that is larger than a pencil eraser.  Always use sunscreen. Apply sunscreen liberally and repeat throughout the day.  Protect yourself by wearing long sleeves, pants, a wide-brimmed hat, and sunglasses when outside.  What should I know about heart disease, diabetes, and high blood pressure?  If you are 58-69 years of age, have your blood pressure checked every 3-5 years. If you are 40 years of  age or older, have your blood pressure checked every year. You should have your blood pressure measured twice-once when you are at a hospital or clinic, and once when you are not at a hospital or clinic. Record the average of the two measurements. To check your blood pressure when you are not at a hospital or clinic, you can use: ? An automated blood pressure machine at a pharmacy. ? A home blood pressure monitor.  Talk to your health care provider about your target blood  pressure.  If you are between 8-108 years old, ask your health care provider if you should take aspirin to prevent heart disease.  Have regular diabetes screenings by checking your fasting blood sugar level. ? If you are at a normal weight and have a low risk for diabetes, have this test once every three years after the age of 23. ? If you are overweight and have a high risk for diabetes, consider being tested at a younger age or more often.  A one-time screening for abdominal aortic aneurysm (AAA) by ultrasound is recommended for men aged 24-75 years who are current or former smokers. What should I know about preventing infection? Hepatitis B If you have a higher risk for hepatitis B, you should be screened for this virus. Talk with your health care provider to find out if you are at risk for hepatitis B infection. Hepatitis C Blood testing is recommended for:  Everyone born from [birth year] through 1965.  Anyone with known risk factors for hepatitis C.  Sexually Transmitted Diseases (STDs)  You should be screened each year for STDs including gonorrhea and chlamydia if: ? You are sexually active and are younger than 77 years of age. ? You are older than 77 years of age and your health care provider tells you that you are at risk for this type of infection. ? Your sexual activity has changed since you were last screened and you are at an increased risk for chlamydia or gonorrhea. Ask your health care provider if you are at risk.  Talk with your health care provider about whether you are at high risk of being infected with HIV. Your health care provider may recommend a prescription medicine to help prevent HIV infection.  What else can I do?  Schedule regular health, dental, and eye exams.  Stay current with your vaccines (immunizations).  Do not use any tobacco products, such as cigarettes, chewing tobacco, and e-cigarettes. If you need help quitting, ask your health care  provider.  Limit alcohol intake to no more than 2 drinks per day. One drink equals 12 ounces of beer, 5 ounces of wine, or 1 ounces of hard liquor.  Do not use street drugs.  Do not share needles.  Ask your health care provider for help if you need support or information about quitting drugs.  Tell your health care provider if you often feel depressed.  Tell your health care provider if you have ever been abused or do not feel safe at home. This information is not intended to replace advice given to you by your health care provider. Make sure you discuss any questions you have with your health care provider. Document Released: 01/28/2008 Document Revised: 03/30/2016 Document Reviewed: 05/05/2015 Elsevier Interactive Patient Education  [employer].

## 2017-12-05 NOTE — Assessment & Plan Note (Signed)
Advanced directive discussion - needs updating. Wife is HCPOA. has packet at home.

## 2017-12-07 LAB — MICROALBUMIN / CREATININE URINE RATIO
Creatinine,U: 44.9 mg/dL
Microalb Creat Ratio: 1.6 mg/g (ref 0.0–30.0)
Microalb, Ur: <0.7 mg/dL (ref 0.0–1.9)

## 2017-12-25 ENCOUNTER — Encounter: Payer: Self-pay | Admitting: Dietician

## 2017-12-28 DIAGNOSIS — M5126 Other intervertebral disc displacement, lumbar region: Secondary | ICD-10-CM | POA: Diagnosis not present

## 2018-01-11 DIAGNOSIS — M25559 Pain in unspecified hip: Secondary | ICD-10-CM | POA: Diagnosis not present

## 2018-01-11 DIAGNOSIS — M5417 Radiculopathy, lumbosacral region: Secondary | ICD-10-CM | POA: Diagnosis not present

## 2018-01-19 DIAGNOSIS — M1611 Unilateral primary osteoarthritis, right hip: Secondary | ICD-10-CM | POA: Diagnosis not present

## 2018-01-19 DIAGNOSIS — Z471 Aftercare following joint replacement surgery: Secondary | ICD-10-CM | POA: Diagnosis not present

## 2018-01-19 DIAGNOSIS — M1711 Unilateral primary osteoarthritis, right knee: Secondary | ICD-10-CM | POA: Diagnosis not present

## 2018-01-19 DIAGNOSIS — Z96651 Presence of right artificial knee joint: Secondary | ICD-10-CM | POA: Diagnosis not present

## 2018-01-19 DIAGNOSIS — Z96641 Presence of right artificial hip joint: Secondary | ICD-10-CM | POA: Diagnosis not present

## 2018-02-04 ENCOUNTER — Encounter: Payer: Self-pay | Admitting: Cardiovascular Disease

## 2018-02-10 ENCOUNTER — Emergency Department
Admission: EM | Admit: 2018-02-10 | Discharge: 2018-02-10 | Disposition: A | Discharge status: Home / Self Care | Payer: PPO | Attending: Emergency Medicine | Admitting: Emergency Medicine

## 2018-02-10 ENCOUNTER — Emergency Department: Payer: PPO

## 2018-02-10 ENCOUNTER — Encounter: Payer: Self-pay | Admitting: Emergency Medicine

## 2018-02-10 ENCOUNTER — Other Ambulatory Visit: Payer: Self-pay

## 2018-02-10 DIAGNOSIS — Y929 Unspecified place or not applicable: Secondary | ICD-10-CM | POA: Diagnosis not present

## 2018-02-10 DIAGNOSIS — Z96651 Presence of right artificial knee joint: Secondary | ICD-10-CM | POA: Insufficient documentation

## 2018-02-10 DIAGNOSIS — Z96643 Presence of artificial hip joint, bilateral: Secondary | ICD-10-CM | POA: Diagnosis not present

## 2018-02-10 DIAGNOSIS — Z23 Encounter for immunization: Secondary | ICD-10-CM | POA: Insufficient documentation

## 2018-02-10 DIAGNOSIS — E119 Type 2 diabetes mellitus without complications: Secondary | ICD-10-CM | POA: Insufficient documentation

## 2018-02-10 DIAGNOSIS — Y9389 Activity, other specified: Secondary | ICD-10-CM | POA: Diagnosis not present

## 2018-02-10 DIAGNOSIS — W298XXA Contact with other powered powered hand tools and household machinery, initial encounter: Secondary | ICD-10-CM | POA: Diagnosis not present

## 2018-02-10 DIAGNOSIS — I10 Essential (primary) hypertension: Secondary | ICD-10-CM | POA: Insufficient documentation

## 2018-02-10 DIAGNOSIS — Z87891 Personal history of nicotine dependence: Secondary | ICD-10-CM | POA: Insufficient documentation

## 2018-02-10 DIAGNOSIS — Z7982 Long term (current) use of aspirin: Secondary | ICD-10-CM | POA: Diagnosis not present

## 2018-02-10 DIAGNOSIS — S61402A Unspecified open wound of left hand, initial encounter: Secondary | ICD-10-CM | POA: Diagnosis not present

## 2018-02-10 DIAGNOSIS — Y999 Unspecified external cause status: Secondary | ICD-10-CM | POA: Diagnosis not present

## 2018-02-10 DIAGNOSIS — S61412A Laceration without foreign body of left hand, initial encounter: Secondary | ICD-10-CM | POA: Insufficient documentation

## 2018-02-10 MED ORDER — TETANUS-DIPHTH-ACELL PERTUSSIS 5-2.5-18.5 LF-MCG/0.5 IM SUSP
0.5000 mL | Freq: Once | INTRAMUSCULAR | Status: AC
  Administered 2018-02-10: 0.5 mL via INTRAMUSCULAR
  Filled 2018-02-10: qty 0.5

## 2018-02-10 NOTE — ED Provider Notes (Signed)
[facility] Emergency Department Provider Note  ____________________________________________  Time seen: Approximately 4:11 PM  I have reviewed the triage vital signs and the nursing notes.   HISTORY  Chief Complaint Laceration    HPI [name] is a 77 y.o. male who presents the emergency department complaining of laceration to the left hand.  Patient was using a drill with his right hand, holding a piece of wood with his left from the drill penetrated through the wood and caught his hand.  Patient has a puncture/laceration wound to the palmar aspect of the hand overlying the MCP joint between the third and fourth digit.  Patient was able to control the bleeding with direct pressure.  He endorses full range of motion to all 5 digits left hand.  No other injury or complaint.  Patient's last tetanus shot was current 10 years ago.  Patient thoroughly cleansed out the wound with soap and water and hydrogen peroxide prior to arrival.  No medications for this complaint prior to arrival.    Past Medical History:  Diagnosis Date  . BENIGN PROSTATIC HYPERTROPHY, WITH URINARY OBSTRUCTION 09/06/2007  . 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 Community acquired pneumonia of right lower lobe of lung ([place]) 06/05/2017  . Diabetes mellitus without complication ([place])    diet controled per dr note  . 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 DVT, HX OF 1998  . GERD 04/30/2007  . Headache    hx migraines  . History of hiatal hernia   . History of shingles   . HYPERLIPIDEMIA 04/26/2007  . HYPERTENSION 04/30/2007  . INGUINAL HERNIA, RIGHT 05/26/2010  . LOC OSTEOARTHROS NOT SPEC PRIM/SEC LOWER LEG 11/05/2009  . Lumbar disc disease with radiculopathy   . Myocardial infarction ([place]) 1989  . OSTEOARTHRITIS 04/26/2007  . 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 Pneumonia 07/2014    [facility] hospitalization  . PSA, INCREASED 07/09/2008  . Spinal stenosis of lumbar region     Patient Active Problem List   Diagnosis Date Noted  . Cervical stenosis of spinal canal 06/05/2017  . Pedal edema 06/05/2017  . Health maintenance examination 12/06/2016  . 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 Overweight (BMI 25.0-29.9) 07/04/2016  . Low vitamin B12 level 01/01/2016  . Medicare annual wellness visit, subsequent 07/03/2015  . Advanced care planning/counseling discussion 07/03/2015  . Prediabetes 05/04/2015  . Angina pectoris ([place]) 05/04/2015  . Ex-smoker 05/04/2015  . Recurrent sinusitis 08/28/2014  . 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 Spermatocele 04/02/2013  . Syncopal vertigo 11/04/2010  . Lumbar disc disease with radiculopathy 11/04/2010  . INGUINAL HERNIA, RIGHT 05/26/2010  . 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 PVD (peripheral vascular disease) with claudication ([place]) 08/11/2009  . 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 Carotid stenosis 07/09/2008  . Elevated prostate specific antigen (PSA) 07/09/2008  . 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 Essential hypertension 04/30/2007  . GERD 04/30/2007  . Hyperlipidemia 04/26/2007  . OA (osteoarthritis) of knee 04/26/2007  . DVT, HX OF 04/26/2007    Past Surgical History:  Procedure Laterality Date  . APPENDECTOMY     rupture  . CARDIAC CATHETERIZATION [name] 07/07/2015   Procedure: Left Heart Cath and Cors/Grafts Angiography;  Surgeon: [name], MD;  Location: [facility];  Service: Cardiovascular;  Laterality: [name];  . CATARACT EXTRACTION, BILATERAL    .  CERVICAL SPINE SURGERY  12/2016   cervical stenosis [name])  . COLONOSCOPY  03/2010   HP polyp, diverticulosis, rpt 10 yrs ([name])  . CORONARY ARTERY BYPASS GRAFT  1990   3 vessel   . CYSTOSCOPY  12/23/10   Cope  . KNEE ARTHROSCOPY Right    x2   . LAMINOTOMY  1986   L5/S1 lumbar laminotomy for two ruptured discs/fusion  . LUMBAR LAMINECTOMY/DECOMPRESSION MICRODISCECTOMY [name] 07/13/2016   Procedure: LUMBAR TWO-THREE, LUMBAR THREE-FOUR, LUMBAR FOUR-FIVE LAMINECTOMY AND FORAMINOTOMY;  Surgeon: [name], MD;  Location: [facility];  Service: Neurosurgery;  Laterality: [name];  LAMINECTOMY AND FORAMINOTOMY L2-L3, L3-L4,L4-L5  . TOTAL HIP ARTHROPLASTY Bilateral 1999,2000  . TOTAL KNEE ARTHROPLASTY Right 03/06/2017   Procedure: RIGHT TOTAL KNEE ARTHROPLASTY;  Surgeon: Gaynelle Arabian, MD;  Location: [facility];  Service: Orthopedics;  Laterality: Right;    Prior to Admission medications   Medication Sig Start Date End Date Taking? Authorizing Provider  acetaminophen (TYLENOL) 500 MG tablet Take 2 tablets (1,000 mg total) by mouth as needed for moderate pain. 12/05/17   Ria Bush, MD  albuterol (PROVENTIL HFA;VENTOLIN HFA) 108 (90 Base) MCG/ACT inhaler Inhale 2 puffs into the lungs every 6 (six) hours as needed for wheezing or shortness of breath.    [provider]  aspirin EC 81 MG tablet Take 1 tablet (81 mg total) by mouth daily. 05/01/17   [name], MD  docusate sodium (COLACE) 100 MG capsule Take 1 capsule (100 mg total) by mouth 2 (two) times daily. 07/15/16   [name], MD  ezetimibe (ZETIA) 10 MG tablet TAKE ONE TABLET BY MOUTH EVERY DAY 02/23/17   [name], MD  finasteride (PROSCAR) 5 MG tablet Take 5 mg by mouth daily.      [provider]  gabapentin (NEURONTIN) 300 MG capsule Take 300 mg by mouth 3 (three) times daily.    [provider]  HYDROcodone-acetaminophen (NORCO/VICODIN) 5-325 MG tablet  11/24/17   [provider]  isosorbide mononitrate (IMDUR) 30 MG 24 hr tablet TAKE ONE TABLET EVERY DAY 11/14/17   [name], MD  nitroGLYCERIN (NITROSTAT) 0.4 MG SL tablet Place 1 tablet (0.4 mg total) under the tongue every 5 (five) minutes as needed for chest pain. 05/01/17    [name], MD  pantoprazole (PROTONIX) 40 MG tablet Take 40 mg by mouth daily.    [provider]  simvastatin (ZOCOR) 40 MG tablet Take 1 tablet (40 mg total) by mouth at bedtime. 09/22/17 12/21/17  [name], MD  tolterodine (DETROL LA) 4 MG 24 hr capsule Take 4 mg by mouth daily.    [provider]    Allergies Morphine and Vioxx [rofecoxib]  Family History  Problem Relation Age of Onset  . Stroke Mother   . Heart attack Mother   . Diabetes Mother   . Stroke Father   . Heart disease Father   . Kidney disease Father        PCKD  . Cancer Sister        throat  . Diabetes Brother   . Kidney disease Sister        PCKD  . Cancer Other        5/7 nephews with lung cancer    Social History Social History   Tobacco Use  . Smoking status: Former Smoker    Packs/day: 1.00    Years: 25.00    Pack years: 25.00    Types: Cigarettes  Last attempt to quit: 08/15/1988    Years since quitting: 29.5  . Smokeless tobacco: Never Used  Substance Use Topics  . Alcohol use: No    Alcohol/week: 0.0 oz  . Drug use: No     Review of Systems  Constitutional: No fever/chills Eyes: No visual changes.  Cardiovascular: no chest pain. Respiratory: no cough. No SOB. Gastrointestinal: No abdominal pain.  No nausea, no vomiting.   Musculoskeletal: Negative for musculoskeletal pain. Skin: Puncture/laceration wound to the palmar aspect of the left hand between the third and fourth MCP joints. Neurological: Negative for headaches, focal weakness or numbness. 10-point ROS otherwise negative.  ____________________________________________   PHYSICAL EXAM:  VITAL SIGNS: ED Triage Vitals  Enc Vitals Group     BP 02/10/18 1555 (!) 158/108     Pulse Rate 02/10/18 1555 76     Resp 02/10/18 1555 20     Temp 02/10/18 1555 98 F (36.7 C)     Temp Source 02/10/18 1555 Oral     SpO2 02/10/18 1555 94 %     Weight 02/10/18 1556 203 lb (92.1 kg)     Height 02/10/18  1556 6\' 1"  (1.854 m)     Head Circumference --      Peak Flow --      Pain Score 02/10/18 1556 0     Pain Loc --      Pain Edu? --      Excl. in [place]? --      Constitutional: Alert and oriented. Well appearing and in no acute distress. Eyes: Conjunctivae are normal. PERRL. EOMI. Head: Atraumatic. Neck: No stridor.    Cardiovascular: Normal rate, regular rhythm. Normal S1 and S2.  Good peripheral circulation. Respiratory: Normal respiratory effort without tachypnea or retractions. Lungs CTAB. Good air entry to the bases with no decreased or absent breath sounds. Musculoskeletal: Full range of motion to all extremities. No gross deformities appreciated. Neurologic:  Normal speech and language. No gross focal neurologic deficits are appreciated.  Skin:  Skin is warm, dry and intact. No rash noted.  Puncture wound is noted to the palmar aspect of the left hand between the third and fourth MCP joints.  No bleeding.  No visible foreign body.  Edges are rough and macerated given the nature of injury.  Patient has full range of motion to the third and fourth digit.  Sensation and capillary refill intact all 5 digits. Psychiatric: Mood and affect are normal. Speech and behavior are normal. Patient exhibits appropriate insight and judgement.   ____________________________________________   LABS (all labs ordered are listed, but only abnormal results are displayed)  Labs Reviewed - No data to display ____________________________________________  EKG   ____________________________________________  RADIOLOGY I personally viewed and evaluated these images as part of my medical decision making, as well as reviewing the written report by the radiologist.  Agree with radiologist of no acute osseous abnormality.  Visualization of the soft tissue over overlying laceration is unremarkable for foreign body.  Dg Hand Complete Left  Result Date: 02/10/2018 CLINICAL DATA:  Puncture wound between the  third and fourth digits from drill injury 2 hours ago, initial encounter EXAM: LEFT HAND - COMPLETE 3+ VIEW COMPARISON:  None. FINDINGS: There is no evidence of fracture or dislocation. There is no evidence of arthropathy or other focal bone abnormality. Soft tissues are unremarkable. Tiny densities are noted adjacent to the fifth metacarpal on the frontal film and in the palmar region on the lateral film. This may  be related to the overlying bandage. This does not appear to be in the area of clinical concern. IMPRESSION: Densities identified as described likely related to the underlying bandages. No definitive bony abnormality is seen. Electronically Signed   By: Inez Catalina M.D.   On: 02/10/2018 16:59    ____________________________________________    PROCEDURES  Procedure(s) performed:    Marland KitchenMarland KitchenLaceration Repair Date/Time: 02/10/2018 4:59 PM Performed by: Darletta Moll, PA-C Authorized by: Darletta Moll, PA-C   Consent:    Consent obtained:  Verbal   Consent given by:  Patient   Risks discussed:  Pain and poor wound healing Anesthesia (see MAR for exact dosages):    Anesthesia method:  None Laceration details:    Location:  Hand   Hand location:  L palm   Length (cm):  0.8 Repair type:    Repair type:  Simple Pre-procedure details:    Preparation:  Patient was prepped and draped in usual sterile fashion and imaging obtained to evaluate for foreign bodies Exploration:    Hemostasis achieved with:  Direct pressure   Wound exploration: wound explored through full range of motion and entire depth of wound probed and visualized     Wound extent: no foreign bodies/material noted, no muscle damage noted, no nerve damage noted, no tendon damage noted, no underlying fracture noted and no vascular damage noted     Contaminated: no   Treatment:    Area cleansed with:  Shur-Clens   Amount of cleaning:  Standard Skin repair:    Repair method:  Tissue adhesive Approximation:     Approximation:  Close Post-procedure details:    Dressing:  Non-adherent dressing, tube gauze and splint for protection   Patient tolerance of procedure:  Tolerated well, no immediate complications      Medications  Tdap (BOOSTRIX) injection 0.5 mL (0.5 mLs Intramuscular Given 02/10/18 1618)     ____________________________________________   INITIAL IMPRESSION / ASSESSMENT AND PLAN / ED COURSE  Pertinent labs & imaging results that were available during my care of the patient were reviewed by me and considered in my medical decision making (see chart for details).  Review of the [place] CSRS was performed in accordance of the [place] prior to dispensing any controlled drugs.  Clinical Course as of Feb 10 1706  Sat Feb 10, 2018  1614 Patient reports emergency department with a puncture wound between the third and fourth MCP joint left hand.  Patient sustained this injury from power drill.  Given the nature of injury, edges are macerated.  I discussed closure with the patient.  Given the nature of the injury with a twisting/tearing motion and macerated edges, sutures are not the best option given the location.  Patient will be thoroughly cleansed, Dermabond applied, splint applied for reduction of motion to the third and fourth digits.  Patient verbalizes understanding and verbalizes compliance with plan.   [JC]    Clinical Course User Index [JC] Kaylon Hitz, Charline Bills, PA-C     Patient's diagnosis is consistent with laceration to the left hand.  Patient presents with a puncture/laceration wound to the left hand.  X-ray reveals no acute foreign body.  Exam was otherwise reassuring with no indication of ligament or vascular damage.  Area was thoroughly cleansed.  Given the nature of injury with macerated tissue, skin adhesive was most appropriate measure for closure.  This was applied with no complications.  Splint applied to the area to limit movement to allow proper healing.  Wound care  instructions are discussed with the patient.  Tetanus shot updated at this visit.  No prescriptions at this time.  Patient will follow primary care as needed. Patient is given ED precautions to return to the ED for any worsening or new symptoms.     ____________________________________________  FINAL CLINICAL IMPRESSION(S) / ED DIAGNOSES  Final diagnoses:  Laceration of left hand without foreign body, initial encounter      NEW MEDICATIONS STARTED DURING THIS VISIT:  ED Discharge Orders    None          This chart was dictated using voice recognition software/Dragon. Despite best efforts to proofread, errors can occur which can change the meaning. Any change was purely unintentional.    Darletta Moll, PA-C 02/10/18 1708    Schuyler Amor, MD 02/10/18 2150

## 2018-02-10 NOTE — ED Triage Notes (Signed)
Drilled L hand between 3rd and 4th finger about 1 hour ago. Bandage over with no bleed through.

## 2018-02-10 NOTE — ED Notes (Signed)
X-ray at bedside

## 2018-02-14 ENCOUNTER — Encounter: Payer: Self-pay | Admitting: Family Medicine

## 2018-02-14 ENCOUNTER — Ambulatory Visit (INDEPENDENT_AMBULATORY_CARE_PROVIDER_SITE_OTHER): Payer: PPO | Admitting: Family Medicine

## 2018-02-14 VITALS — BP 146/70 | HR 79 | Temp 97.8°F | Ht 73.0 in | Wt 209.5 lb

## 2018-02-14 DIAGNOSIS — S61432A Puncture wound without foreign body of left hand, initial encounter: Secondary | ICD-10-CM | POA: Diagnosis not present

## 2018-02-14 DIAGNOSIS — K4090 Unilateral inguinal hernia, without obstruction or gangrene, not specified as recurrent: Secondary | ICD-10-CM | POA: Diagnosis not present

## 2018-02-14 NOTE — Addendum Note (Signed)
Addended by: Ria Bush on: 02/14/2018 01:53 PM   Modules accepted: Level of Service

## 2018-02-14 NOTE — Patient Instructions (Addendum)
Wound cleaned today Dermabond reapplied. Should fall off slowly. Watch for streaking redness or draining pus or worsening pain - if this happens return to be seen.

## 2018-02-14 NOTE — Assessment & Plan Note (Signed)
Suspect this is contributing to R groin discomfort. Will monitor for now, consider CT vs gen surg eval.

## 2018-02-14 NOTE — Progress Notes (Addendum)
BP (!) 146/70 (BP Location: Left Arm, Patient Position: Sitting, Cuff Size: Normal)   Pulse 79   Temp 97.8 F (36.6 C) (Oral)   Ht 6\' 1"  (1.854 m)   Wt 209 lb 8 oz (95 kg)   SpO2 96%   BMI 27.64 kg/m    CC: f/u L hand injury Subjective:    Patient ID: [name], male    DOB: [birth year], 77 y.o.   MRN: [id number]  HPI: [name] is a 77 y.o. male presenting on 02/14/2018 for Hand Injury (Here for f/u of left hand injury. Drilled through left hand on 02/10/18.)   Recent ER visit for laceration to L hand suffered 02/10/2018 while drilling a piece of wood. Records reviewed - xray was normal. Received Tdap. Wound was repaired with dermabond tissue adhesive. No antibiotics.  Here for f/u.   Feels hand is healing well, glue may be falling off.  No fevers/chills, streaking redness.  Has bled a little at night.   Also - some R groin pain into testicle. Mainly notices with lifting equipment. Hip and back doctors have said not an issue.   Relevant past medical, surgical, family and social history reviewed and updated as indicated. Interim medical history since our last visit reviewed. Allergies and medications reviewed and updated. Outpatient Medications Prior to Visit  Medication Sig Dispense Refill  . acetaminophen (TYLENOL) 500 MG tablet Take 2 tablets (1,000 mg total) by mouth as needed for moderate pain.    Marland Kitchen albuterol (PROVENTIL HFA;VENTOLIN HFA) 108 (90 Base) MCG/ACT inhaler Inhale 2 puffs into the lungs every 6 (six) hours as needed for wheezing or shortness of breath.    Marland Kitchen aspirin EC 81 MG tablet Take 1 tablet (81 mg total) by mouth daily. 90 tablet 3  . docusate sodium (COLACE) 100 MG capsule Take 1 capsule (100 mg total) by mouth 2 (two) times daily. 60 capsule 0  . ezetimibe (ZETIA) 10 MG tablet TAKE ONE TABLET BY MOUTH EVERY DAY 90 tablet 3  . finasteride (PROSCAR) 5 MG tablet Take 5 mg by mouth daily.      Marland Kitchen gabapentin (NEURONTIN) 300 MG capsule Take 300 mg by  mouth 3 (three) times daily.    Marland Kitchen HYDROcodone-acetaminophen (NORCO/VICODIN) 5-325 MG tablet     . isosorbide mononitrate (IMDUR) 30 MG 24 hr tablet TAKE ONE TABLET EVERY DAY 90 tablet 1  . nitroGLYCERIN (NITROSTAT) 0.4 MG SL tablet Place 1 tablet (0.4 mg total) under the tongue every 5 (five) minutes as needed for chest pain. 25 tablet 6  . pantoprazole (PROTONIX) 40 MG tablet Take 40 mg by mouth daily.    Marland Kitchen tolterodine (DETROL LA) 4 MG 24 hr capsule Take 4 mg by mouth daily.    . simvastatin (ZOCOR) 40 MG tablet Take 1 tablet (40 mg total) by mouth at bedtime.     No facility-administered medications prior to visit.      Per HPI unless specifically indicated in ROS section below Review of Systems     Objective:    BP (!) 146/70 (BP Location: Left Arm, Patient Position: Sitting, Cuff Size: Normal)   Pulse 79   Temp 97.8 F (36.6 C) (Oral)   Ht 6\' 1"  (1.854 m)   Wt 209 lb 8 oz (95 kg)   SpO2 96%   BMI 27.64 kg/m   Wt Readings from Last 3 Encounters:  02/14/18 209 lb 8 oz (95 kg)  02/10/18 203 lb (92.1 kg)  12/05/17 214  lb (97.1 kg)    Physical Exam  Constitutional: He appears well-developed and well-nourished. No distress.  Abdominal: Hernia confirmed negative in the right inguinal area (fullness at R inguinal region without obvious hernia or discomfort) and confirmed negative in the left inguinal area.  Genitourinary: Penis normal. Right testis shows no mass, no swelling and no tenderness. Left testis shows swelling (known hydrocele/spermatocele). Left testis shows no mass and no tenderness.  Musculoskeletal: Normal range of motion.       Hands: Healing L jagged wound with skin glue present, with some wound dehiscence into interdigital area, no surrounding erythema or induration or tenderness, no drainage  Lymphadenopathy: No inguinal adenopathy noted on the right or left side.  Skin: Skin is warm and dry. No rash noted.  Nursing note and vitals reviewed.     Assessment &  Plan:   Problem List Items Addressed This Visit    Puncture wound of left hand without complication - Primary    Wound irrigated and cleaned with sterile water and betadine. Excess piece of skin/glue cut off then new dermabond applied to prevent dehiscence. Wound care instructions provided. Red flags suggestive of infection reviewed - none today.       Inguinal hernia    Suspect this is contributing to R groin discomfort. Will monitor for now, consider CT vs gen surg eval.          No orders of the defined types were placed in this encounter.  No orders of the defined types were placed in this encounter.   Follow up plan: No follow-ups on file.  Ria Bush, MD

## 2018-02-14 NOTE — Assessment & Plan Note (Signed)
Wound irrigated and cleaned with sterile water and betadine. Excess piece of skin/glue cut off then new dermabond applied to prevent dehiscence. Wound care instructions provided. Red flags suggestive of infection reviewed - none today.

## 2018-03-01 ENCOUNTER — Encounter: Payer: Self-pay | Admitting: Cardiovascular Disease

## 2018-03-01 ENCOUNTER — Ambulatory Visit: Payer: PPO | Admitting: Cardiovascular Disease

## 2018-03-01 VITALS — BP 130/64 | HR 60 | Ht 73.0 in | Wt 207.5 lb

## 2018-03-01 DIAGNOSIS — I2510 Atherosclerotic heart disease of native coronary artery without angina pectoris: Secondary | ICD-10-CM | POA: Diagnosis not present

## 2018-03-01 DIAGNOSIS — I739 Peripheral vascular disease, unspecified: Secondary | ICD-10-CM | POA: Diagnosis not present

## 2018-03-01 DIAGNOSIS — E785 Hyperlipidemia, unspecified: Secondary | ICD-10-CM | POA: Diagnosis not present

## 2018-03-01 NOTE — Patient Instructions (Signed)
Medication Instructions: Your physician recommends that you continue on your current medications as directed. Please refer to the Current Medication list given to you today.  If you need a refill on your cardiac medications before your next appointment, please call your pharmacy.   Follow-Up: Your physician wants you to follow-up in 6 months with Dr. Arida. You will receive a reminder letter in the mail two months in advance. If you don't receive a letter, please call our office at [phone] to schedule this follow-up appointment.  Thank you for choosing [facility]!    

## 2018-03-01 NOTE — Progress Notes (Signed)
Cardiology Office Note   Date:  03/01/2018   ID:  [name], DOB 09-29-1940, MRN [id number]  PCP:  Ria Bush, MD  Cardiologist:  Dr. Rockey Situ  Chief Complaint  Patient presents with  . OTHER    Per pt discuss possible stent c/o leg/calve pain. Meds reviewed verbally  with pt.      History of Present Illness: [name] is a 77 y.o. male who is here today for a follow-up visit regarding peripheral arterial disease. The patient has known history of coronary artery disease status post CABG in 1990, PCI of the left circumflex in 2002, DVT in the left lower extremity, hyperlipidemia and back surgery for spinal stenosis in 2017.  He had hip replacement x 2. 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  He was seen a few months ago for peripheral arterial disease.  Lower extremity arterial Doppler  showed noncompressible arteries on the right side with normal toe brachial index and biphasic waveform.  Left ABI was normal with biphasic waveforms. Lower extremity arterial duplex showed evidence of bilateral SFA disease. His symptoms were felt to be multifactorial due to PAD, arthritis and severe peripheral neuropathy.  He continues to struggle with lower back pain, right hip pain and right knee pain in addition to mild bilateral calf claudication.  He complains of calf discomfort that wakes him up at night with burning sensation in feet and numbness. He is very frustrated with his symptoms.  He had prior right knee replacement last year and he feels that he never recovered from that.  Past Medical History:  Diagnosis Date  . BENIGN PROSTATIC HYPERTROPHY, WITH URINARY OBSTRUCTION 09/06/2007  . 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 Community acquired pneumonia of right lower lobe of lung ([place]) 06/05/2017  . Diabetes mellitus  without complication ([place])    diet controled per dr note  . 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 DVT, HX OF 1998  . GERD 04/30/2007  . Headache    hx migraines  . History of hiatal hernia   . History of shingles   . HYPERLIPIDEMIA 04/26/2007  . HYPERTENSION 04/30/2007  . INGUINAL HERNIA, RIGHT 05/26/2010  . LOC OSTEOARTHROS NOT SPEC PRIM/SEC LOWER LEG 11/05/2009  . Lumbar disc disease with radiculopathy   . Myocardial infarction ([place]) 1989  . OSTEOARTHRITIS 04/26/2007  . 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 Pneumonia 07/2014   [facility] hospitalization  . PSA, INCREASED 07/09/2008  . Spinal stenosis of lumbar region     Past Surgical History:  Procedure Laterality Date  . APPENDECTOMY     rupture  . CARDIAC CATHETERIZATION [name] 07/07/2015   Procedure: Left Heart Cath and Cors/Grafts Angiography;  Surgeon: [name], MD;  Location: [facility];  Service: Cardiovascular;  Laterality: [name];  . CATARACT EXTRACTION, BILATERAL    . CERVICAL SPINE SURGERY  12/2016   cervical stenosis [name])  . COLONOSCOPY  03/2010   HP polyp, diverticulosis, rpt 10 yrs ([name])  . CORONARY ARTERY BYPASS GRAFT  1990   3 vessel   . CYSTOSCOPY  12/23/10   [facility]  . KNEE ARTHROSCOPY Right    x2  . LAMINOTOMY  1986   L5/S1 lumbar laminotomy for two ruptured discs/fusion  . LUMBAR LAMINECTOMY/DECOMPRESSION MICRODISCECTOMY [name] 07/13/2016   Procedure: LUMBAR TWO-THREE, LUMBAR THREE-FOUR, LUMBAR FOUR-FIVE LAMINECTOMY  AND FORAMINOTOMY;  Surgeon: [name], MD;  Location: [facility];  Service: Neurosurgery;  Laterality: [name];  LAMINECTOMY AND FORAMINOTOMY L2-L3, L3-L4,L4-L5  . TOTAL HIP ARTHROPLASTY Bilateral 1999,2000  . TOTAL KNEE ARTHROPLASTY Right 03/06/2017   Procedure: RIGHT TOTAL KNEE ARTHROPLASTY;  Surgeon: Gaynelle Arabian, MD;  Location: [facility];  Service: Orthopedics;  Laterality: Right;     Current Outpatient Medications  Medication Sig Dispense Refill  .  acetaminophen (TYLENOL) 500 MG tablet Take 2 tablets (1,000 mg total) by mouth as needed for moderate pain.    Marland Kitchen albuterol (PROVENTIL HFA;VENTOLIN HFA) 108 (90 Base) MCG/ACT inhaler Inhale 2 puffs into the lungs every 6 (six) hours as needed for wheezing or shortness of breath.    Marland Kitchen aspirin EC 81 MG tablet Take 1 tablet (81 mg total) by mouth daily. 90 tablet 3  . docusate sodium (COLACE) 100 MG capsule Take 1 capsule (100 mg total) by mouth 2 (two) times daily. 60 capsule 0  . ezetimibe (ZETIA) 10 MG tablet TAKE ONE TABLET BY MOUTH EVERY DAY 90 tablet 3  . finasteride (PROSCAR) 5 MG tablet Take 5 mg by mouth daily.      Marland Kitchen gabapentin (NEURONTIN) 300 MG capsule Take 300 mg by mouth 3 (three) times daily.    Marland Kitchen HYDROcodone-acetaminophen (NORCO/VICODIN) 5-325 MG tablet     . isosorbide mononitrate (IMDUR) 30 MG 24 hr tablet TAKE ONE TABLET EVERY DAY 90 tablet 1  . nitroGLYCERIN (NITROSTAT) 0.4 MG SL tablet Place 1 tablet (0.4 mg total) under the tongue every 5 (five) minutes as needed for chest pain. 25 tablet 6  . pantoprazole (PROTONIX) 40 MG tablet Take 40 mg by mouth daily.    Marland Kitchen tolterodine (DETROL LA) 4 MG 24 hr capsule Take 4 mg by mouth daily.    . simvastatin (ZOCOR) 40 MG tablet Take 1 tablet (40 mg total) by mouth at bedtime.     No current facility-administered medications for this visit.     Allergies:   Morphine and Vioxx [rofecoxib]    Social History:  The patient  reports that he quit smoking about 29 years ago. His smoking use included cigarettes. He has a 25.00 pack-year smoking history. He has never used smokeless tobacco. He reports that he does not drink alcohol or use drugs.   Family History:  The patient's family history includes Cancer in his other and sister; Diabetes in his brother and mother; Heart attack in his mother; Heart disease in his father; Kidney disease in his father and sister; Stroke in his father and mother.    ROS:  Please see the history of present  illness.   Otherwise, review of systems are positive for none.   All other systems are reviewed and negative.    PHYSICAL EXAM: VS:  BP 130/64 (BP Location: Left Arm, Patient Position: Sitting, Cuff Size: Normal)   Pulse 60   Ht 6\' 1"  (1.854 m)   Wt 207 lb 8 oz (94.1 kg)   BMI 27.38 kg/m  , BMI Body mass index is 27.38 kg/m. GEN: Well nourished, well developed, in no acute distress  HEENT: normal  Neck: no JVD, carotid bruits, or masses Cardiac: RRR; no  rubs, or gallops,no edema .  1 out of 6 systolic ejection murmur in the aortic area Respiratory:  clear to auscultation bilaterally, normal work of breathing GI: soft, nontender, nondistended, + BS MS: no deformity or atrophy  Skin: warm and dry, no rash Neuro:  Strength and sensation are  intact Psych: euthymic mood, full affect Vascular: Femoral pulses are normal.  Distal pulses are nonpalpable  EKG:  EKG is  ordered today. EKG showed normal sinus rhythm with no significant ST or T wave changes.  Left axis deviation.  Recent Labs: 06/05/2017: TSH 4.06 11/30/2017: ALT 13; BUN 22; Creatinine, Ser 1.09; Hemoglobin 14.7; Platelets 191.0; Potassium 4.1; Sodium 139    Lipid Panel    Component Value Date/Time   CHOL 127 11/30/2017 1035   TRIG 84.0 11/30/2017 1035   HDL 37.50 (L) 11/30/2017 1035   CHOLHDL 3 11/30/2017 1035   VLDL 16.8 11/30/2017 1035   LDLCALC 73 11/30/2017 1035   LDLDIRECT 106.0 01/01/2016 0844      Wt Readings from Last 3 Encounters:  03/01/18 207 lb 8 oz (94.1 kg)  02/14/18 209 lb 8 oz (95 kg)  02/10/18 203 lb (92.1 kg)      No flowsheet data found.    ASSESSMENT AND PLAN:  1.  Peripheral arterial disease: The patient does have peripheral arterial disease with significant bilateral SFA disease.    Although he has mild bilateral calf claudication, he is limited by multiple other conditions including what seems to be spinal stenosis with disc problems, right hip bursitis and significant pain in his  right knee.  Thus, even if we revascularize his SFA, he is going to continue to be limited by other symptoms. Continue medical therapy for now.  2.  Coronary artery disease involving native coronary arteries without angina: Continue medical therapy.  3.  Hyperlipidemia: He is tolerating simvastatin.  4.  Severe peripheral neuropathy: Continue gabapentin.  Continue to follow-up with Dr. [name] to explore the possibility of surgery on his back if needed.  The patient seems to be very frustrated.    Disposition:   FU with me in 6 months  Signed,  Kathlyn Sacramento, MD  03/01/2018 8:24 AM    [place]

## 2018-03-02 ENCOUNTER — Encounter: Payer: Self-pay | Admitting: Emergency Medicine

## 2018-03-02 ENCOUNTER — Emergency Department: Payer: PPO

## 2018-03-02 ENCOUNTER — Emergency Department
Admission: EM | Admit: 2018-03-02 | Discharge: 2018-03-02 | Disposition: A | Discharge status: Home / Self Care | Payer: PPO | Attending: Emergency Medicine | Admitting: Emergency Medicine

## 2018-03-02 DIAGNOSIS — R51 Headache: Secondary | ICD-10-CM | POA: Diagnosis not present

## 2018-03-02 DIAGNOSIS — Z96651 Presence of right artificial knee joint: Secondary | ICD-10-CM | POA: Insufficient documentation

## 2018-03-02 DIAGNOSIS — Z96643 Presence of artificial hip joint, bilateral: Secondary | ICD-10-CM | POA: Insufficient documentation

## 2018-03-02 DIAGNOSIS — S61412A Laceration without foreign body of left hand, initial encounter: Secondary | ICD-10-CM | POA: Diagnosis not present

## 2018-03-02 DIAGNOSIS — Y999 Unspecified external cause status: Secondary | ICD-10-CM | POA: Insufficient documentation

## 2018-03-02 DIAGNOSIS — S0990XA Unspecified injury of head, initial encounter: Secondary | ICD-10-CM

## 2018-03-02 DIAGNOSIS — M79642 Pain in left hand: Secondary | ICD-10-CM | POA: Diagnosis not present

## 2018-03-02 DIAGNOSIS — W091XXA Fall from playground swing, initial encounter: Secondary | ICD-10-CM | POA: Insufficient documentation

## 2018-03-02 DIAGNOSIS — Z7982 Long term (current) use of aspirin: Secondary | ICD-10-CM | POA: Insufficient documentation

## 2018-03-02 DIAGNOSIS — Y9389 Activity, other specified: Secondary | ICD-10-CM | POA: Insufficient documentation

## 2018-03-02 DIAGNOSIS — I10 Essential (primary) hypertension: Secondary | ICD-10-CM | POA: Insufficient documentation

## 2018-03-02 DIAGNOSIS — Y929 Unspecified place or not applicable: Secondary | ICD-10-CM | POA: Diagnosis not present

## 2018-03-02 DIAGNOSIS — M1611 Unilateral primary osteoarthritis, right hip: Secondary | ICD-10-CM | POA: Diagnosis not present

## 2018-03-02 DIAGNOSIS — E119 Type 2 diabetes mellitus without complications: Secondary | ICD-10-CM | POA: Insufficient documentation

## 2018-03-02 DIAGNOSIS — Z79899 Other long term (current) drug therapy: Secondary | ICD-10-CM | POA: Insufficient documentation

## 2018-03-02 DIAGNOSIS — W19XXXA Unspecified fall, initial encounter: Secondary | ICD-10-CM

## 2018-03-02 DIAGNOSIS — M1711 Unilateral primary osteoarthritis, right knee: Secondary | ICD-10-CM | POA: Diagnosis not present

## 2018-03-02 DIAGNOSIS — S6992XA Unspecified injury of left wrist, hand and finger(s), initial encounter: Secondary | ICD-10-CM | POA: Diagnosis not present

## 2018-03-02 NOTE — ED Notes (Signed)
Pt with lac top of left hand, bleeding controlled. Raised area posterior left head, no wound. Pt a/o.

## 2018-03-02 NOTE — ED Provider Notes (Signed)
[facility] Emergency Department Provider Note  ____________________________________________   First MD Initiated Contact with Patient 03/02/18 1739     (approximate)  I have reviewed the triage vital signs and the nursing notes.   HISTORY  Chief Complaint Hand Injury    HPI [name] is a 77 y.o. male presents emergency department with his family.  He states that he fell off of the swelling in his hand got caught in the chain.  He states he did hurt his shoulder on the right side his upper back and he did hit his head.  He denies any loss of consciousness.  He states he takes a baby aspirin a day.  He bruises easily.  He is mostly concerned about the skin tear to the top of the left hand.  He states his tetanus is up-to-date as he had one about a month ago.  He denies any headaches, nausea, vomiting, chest pain or shortness of breath.  Past Medical History:  Diagnosis Date  . BENIGN PROSTATIC HYPERTROPHY, WITH URINARY OBSTRUCTION 09/06/2007  . 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 Community acquired pneumonia of right lower lobe of lung ([place]) 06/05/2017  . Diabetes mellitus without complication ([place])    diet controled per dr note  . 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 DVT, HX OF 1998  . GERD 04/30/2007  . Headache    hx migraines  . History of hiatal hernia   . History of shingles   . HYPERLIPIDEMIA 04/26/2007  . HYPERTENSION 04/30/2007  . INGUINAL HERNIA, RIGHT 05/26/2010  . LOC OSTEOARTHROS NOT SPEC PRIM/SEC LOWER LEG 11/05/2009  . Lumbar disc disease with radiculopathy   . Myocardial infarction ([place]) 1989  . OSTEOARTHRITIS 04/26/2007  . 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 Pneumonia 07/2014   [facility] hospitalization  . PSA, INCREASED 07/09/2008  . Spinal stenosis of lumbar region     Patient Active Problem List   Diagnosis Date Noted   . Puncture wound of left hand without complication 84/16/6063  . Cervical stenosis of spinal canal 06/05/2017  . Pedal edema 06/05/2017  . Health maintenance examination 12/06/2016  . 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 Overweight (BMI 25.0-29.9) 07/04/2016  . Low vitamin B12 level 01/01/2016  . Medicare annual wellness visit, subsequent 07/03/2015  . Advanced care planning/counseling discussion 07/03/2015  . Prediabetes 05/04/2015  . Angina pectoris ([place]) 05/04/2015  . Ex-smoker 05/04/2015  . Recurrent sinusitis 08/28/2014  . 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 Spermatocele 04/02/2013  . Syncopal vertigo 11/04/2010  . Lumbar disc disease with radiculopathy 11/04/2010  . Inguinal hernia 05/26/2010  . 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 PVD (peripheral vascular disease) with claudication ([place]) 08/11/2009  . 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 Carotid stenosis 07/09/2008  . Elevated prostate specific antigen (PSA) 07/09/2008  . 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 Essential hypertension 04/30/2007  . GERD 04/30/2007  . Hyperlipidemia 04/26/2007  . OA (osteoarthritis) of knee 04/26/2007  . DVT, HX OF 04/26/2007    Past Surgical History:  Procedure Laterality Date  . APPENDECTOMY     rupture  . CARDIAC CATHETERIZATION [name] 07/07/2015   Procedure: Left Heart Cath and Cors/Grafts Angiography;  Surgeon: [name], MD;  Location: [place] [facility];  Service: Cardiovascular;  Laterality: [name];  . CATARACT EXTRACTION, BILATERAL    .  CERVICAL SPINE SURGERY  12/2016   cervical stenosis [name])  . COLONOSCOPY  03/2010   HP polyp, diverticulosis, rpt 10 yrs ([name])  . CORONARY ARTERY BYPASS GRAFT  1990   3 vessel   . CYSTOSCOPY  12/23/10   Cope  . KNEE ARTHROSCOPY Right    x2  . LAMINOTOMY  1986   L5/S1 lumbar laminotomy for two ruptured discs/fusion  . LUMBAR  LAMINECTOMY/DECOMPRESSION MICRODISCECTOMY [name] 07/13/2016   Procedure: LUMBAR TWO-THREE, LUMBAR THREE-FOUR, LUMBAR FOUR-FIVE LAMINECTOMY AND FORAMINOTOMY;  Surgeon: [name], MD;  Location: [place];  Service: Neurosurgery;  Laterality: [name];  LAMINECTOMY AND FORAMINOTOMY L2-L3, L3-L4,L4-L5  . TOTAL HIP ARTHROPLASTY Bilateral 1999,2000  . TOTAL KNEE ARTHROPLASTY Right 03/06/2017   Procedure: RIGHT TOTAL KNEE ARTHROPLASTY;  Surgeon: Gaynelle Arabian, MD;  [facility];  Service: Orthopedics;  Laterality: Right;    Prior to Admission medications   Medication Sig Start Date End Date Taking? Authorizing Provider  acetaminophen (TYLENOL) 500 MG tablet Take 2 tablets (1,000 mg total) by mouth as needed for moderate pain. 12/05/17   Ria Bush, MD  albuterol (PROVENTIL HFA;VENTOLIN HFA) 108 (90 Base) MCG/ACT inhaler Inhale 2 puffs into the lungs every 6 (six) hours as needed for wheezing or shortness of breath.    [provider]  aspirin EC 81 MG tablet Take 1 tablet (81 mg total) by mouth daily. 05/01/17   [name], MD  docusate sodium (COLACE) 100 MG capsule Take 1 capsule (100 mg total) by mouth 2 (two) times daily. 07/15/16   [name], MD  ezetimibe (ZETIA) 10 MG tablet TAKE ONE TABLET BY MOUTH EVERY DAY 02/23/17   [name], MD  finasteride (PROSCAR) 5 MG tablet Take 5 mg by mouth daily.      [provider]  gabapentin (NEURONTIN) 300 MG capsule Take 300 mg by mouth 3 (three) times daily.    [provider]  HYDROcodone-acetaminophen (NORCO/VICODIN) 5-325 MG tablet  11/24/17   [provider]  isosorbide mononitrate (IMDUR) 30 MG 24 hr tablet TAKE ONE TABLET EVERY DAY 11/14/17   [name], MD  nitroGLYCERIN (NITROSTAT) 0.4 MG SL tablet Place 1 tablet (0.4 mg total) under the tongue every 5 (five) minutes as needed for chest pain. 05/01/17   [name], MD  pantoprazole (PROTONIX) 40 MG tablet Take 40 mg by mouth daily.     [provider]  simvastatin (ZOCOR) 40 MG tablet Take 1 tablet (40 mg total) by mouth at bedtime. 09/22/17 12/21/17  [name], MD  tolterodine (DETROL LA) 4 MG 24 hr capsule Take 4 mg by mouth daily.    [provider]    Allergies Morphine and Vioxx [rofecoxib]  Family History  Problem Relation Age of Onset  . Stroke Mother   . Heart attack Mother   . Diabetes Mother   . Stroke Father   . Heart disease Father   . Kidney disease Father        PCKD  . Cancer Sister        throat  . Diabetes Brother   . Kidney disease Sister        PCKD  . Cancer Other        5/7 nephews with lung cancer    Social History Social History   Tobacco Use  . Smoking status: Former Smoker    Packs/day: 1.00    Years: 25.00    Pack years: 25.00    Types: Cigarettes  Last attempt to quit: 08/15/1988    Years since quitting: 29.5  . Smokeless tobacco: Never Used  Substance Use Topics  . Alcohol use: No    Alcohol/week: 0.0 oz  . Drug use: No    Review of Systems  Constitutional: No fever/chills, positive head injury Eyes: No visual changes. ENT: No sore throat. Respiratory: Denies cough Genitourinary: Negative for dysuria. Musculoskeletal: Negative for back pain.  Positive for left hand pain Skin: Negative for rash.  Positive for a skin tear to the left hand    ____________________________________________   PHYSICAL EXAM:  VITAL SIGNS: ED Triage Vitals  Enc Vitals Group     BP 03/02/18 1727 (!) 150/77     Pulse Rate 03/02/18 1727 90     Resp 03/02/18 1727 18     Temp 03/02/18 1727 98.1 F (36.7 C)     Temp Source 03/02/18 1727 Oral     SpO2 03/02/18 1727 94 %     Weight 03/02/18 1729 206 lb (93.4 kg)     Height 03/02/18 1729 6\' 1"  (1.854 m)     Head Circumference --      Peak Flow --      Pain Score 03/02/18 1729 10     Pain Loc --      Pain Edu? --      Excl. in [place]? --     Constitutional: Alert and oriented. Well appearing and in no acute  distress.  Able to answer all questions in appropriate manner Eyes: Conjunctivae are normal.  Head: Atraumatic. Nose: No congestion/rhinnorhea. Mouth/Throat: Mucous membranes are moist.   Neck: Is supple, no lymphadenopathy is noted, no cervical tenderness is noted  cardiovascular: Normal rate, regular rhythm. Respiratory: Normal respiratory effort.  No retractions GU: deferred Musculoskeletal: FROM all extremities, warm and well perfused.  The left hand is tender along the lateral aspect.  There is a skin tear noted down the center of the hand. Neurologic:  Normal speech and language.  Skin:  Skin is warm, dry positive for skin tear along the left hand psychiatric: Mood and affect are normal. Speech and behavior are normal.  ____________________________________________   LABS (all labs ordered are listed, but only abnormal results are displayed)  Labs Reviewed - No data to display ____________________________________________   ____________________________________________  RADIOLOGY  X-ray of the left hand is negative CT of the head is negative for any acute abnormality  ____________________________________________   PROCEDURES  Procedure(s) performed:   Marland KitchenMarland KitchenLaceration Repair Date/Time: 03/02/2018 6:44 PM Performed by: Versie Starks, PA-C Authorized by: Versie Starks, PA-C   Consent:    Consent obtained:  Verbal   Consent given by:  Patient   Risks discussed:  Infection, pain, retained foreign body, poor cosmetic result and poor wound healing   Alternatives discussed:  No treatment Anesthesia (see MAR for exact dosages):    Anesthesia method:  None Laceration details:    Location:  Hand   Hand location:  L hand, dorsum   Length (cm):  4.5   Depth (mm):  2 Repair type:    Repair type:  Simple Exploration:    Hemostasis achieved with:  Direct pressure   Wound exploration: wound explored through full range of motion     Wound extent: no foreign bodies/material  noted, no muscle damage noted, no tendon damage noted and no underlying fracture noted     Contaminated: no   Treatment:    Area cleansed with:  Saline   Amount of  cleaning:  Standard   Irrigation solution:  Sterile saline   Irrigation method:  Tap Skin repair:    Repair method:  Tissue adhesive and Steri-Strips Approximation:    Approximation:  Close Post-procedure details:    Dressing:  Splint for protection   Patient tolerance of procedure:  Tolerated well, no immediate complications      ____________________________________________   INITIAL IMPRESSION / ASSESSMENT AND PLAN / ED COURSE  Pertinent labs & imaging results that were available during my care of the patient were reviewed by me and considered in my medical decision making (see chart for details).  Patient is a 77 year old male presents emergency department after a fall from a swing.  He states his hand got caught in the chain he fell landed on the right shoulder and hit his head.  He denies any loss of consciousness.  On physical exam the patient does appear well is able to answer all questions.  There is no skull tenderness or neck tenderness.  There is a large laceration along the left hand.  Left hand is tender to palpation.  The remainder the exam is unremarkable.  X-ray of the left hand and CT of the head are ordered  X-ray of the left hand is negative for fracture.  CT of the head is negative for any acute abnormality.  Discussed the x-ray and CT results with patient and his daughter.  The area was repaired using Steri-Strips and tissue adhesive.  The patient was given instructions on how to care for the tissue adhesive.  He was given a splint to hold the hand still for 3 to 4 days.  After that he may remove the splint and keep the hand as dry as possible.  He was given instructions on how to trim back the Steri-Strips and he is not to rip the Steri-Strips off of the skin.  He states he understands to comply  with our instructions.  He was discharged in stable condition in the care of his daughter.       As part of my medical decision making, I reviewed the following data within the [facility] notes reviewed and incorporated, Old chart reviewed, Radiograph reviewed x-ray of the left hand is negative, CT of the head is negative for any acute abnormality, Notes from prior ED visits and [place] Controlled Substance Database  ____________________________________________   FINAL CLINICAL IMPRESSION(S) / ED DIAGNOSES  Final diagnoses:  Laceration of left hand without foreign body, initial encounter  Fall, initial encounter  Minor head injury, initial encounter      NEW MEDICATIONS STARTED DURING THIS VISIT:  New Prescriptions   No medications on file     Note:  This document was prepared using Dragon voice recognition software and may include unintentional dictation errors.    Versie Starks, PA-C 03/02/18 1847    Schuyler Amor, MD 03/03/18 1323

## 2018-03-02 NOTE — ED Triage Notes (Addendum)
Patient has a skin tear to the top of his left hand, approx. 3 cm.  Patient states his arm was caught in the chain of a swing.  Patient is moving fingers well.  Sensation and mobility intact.  Patient fell out of the swing.  Patient states he hit his head, his right shoulder and his upper back.  Patient denies losing consciousness.  Denies taking blood thinners.  Ambulatory to triage.

## 2018-03-02 NOTE — Discharge Instructions (Addendum)
Wear the splint for 3 to 4 days.  After that try to keep the hand as dry as possible.  You may apply a plastic bag over your hand in the splint to bathe.  The Steri-Strips will start to curl up on their own and trim them back.  Do not rip them off as this will reopen the wound.  Return to the emergency department if you have any difficulty with the wound or worsening.

## 2018-03-06 ENCOUNTER — Telehealth: Payer: Self-pay | Admitting: Family Medicine

## 2018-03-06 NOTE — Telephone Encounter (Signed)
See below crm  Can pt be worked in if so where or do you want me to offer NP   Copied from [place] [phone]. Topic: Appointment Scheduling - Scheduling Inquiry for Clinic >> Mar 06, 2018  9:41 AM Bea Graff, NT wrote: Reason for CRM: Pt calling and needs a ER follow-up with Dr. Danise Mina next week for laceration on left hand. Can pt be worked in? Please advise.

## 2018-03-07 ENCOUNTER — Telehealth: Payer: Self-pay | Admitting: *Deleted

## 2018-03-07 NOTE — Telephone Encounter (Signed)
Scheduled with Dr. Glori Bickers 07/25.

## 2018-03-07 NOTE — Telephone Encounter (Signed)
I will see him then

## 2018-03-07 NOTE — Telephone Encounter (Signed)
Clearance to hold aspirin 5 days prior to surgery, L2-3 microdiscectomy, has been faxed to [facility] (attention [place]) at [phone].  Patient had an office visit with Dr. [name] on 03/01/18.

## 2018-03-08 ENCOUNTER — Encounter: Payer: Self-pay | Admitting: Family Medicine

## 2018-03-08 ENCOUNTER — Ambulatory Visit (INDEPENDENT_AMBULATORY_CARE_PROVIDER_SITE_OTHER): Payer: PPO | Admitting: Family Medicine

## 2018-03-08 VITALS — BP 126/68 | HR 64 | Temp 98.0°F | Ht 73.0 in | Wt 208.5 lb

## 2018-03-08 DIAGNOSIS — S61412S Laceration without foreign body of left hand, sequela: Secondary | ICD-10-CM

## 2018-03-08 DIAGNOSIS — S61412A Laceration without foreign body of left hand, initial encounter: Secondary | ICD-10-CM | POA: Insufficient documentation

## 2018-03-08 DIAGNOSIS — W19XXXA Unspecified fall, initial encounter: Secondary | ICD-10-CM | POA: Insufficient documentation

## 2018-03-08 DIAGNOSIS — W19XXXS Unspecified fall, sequela: Secondary | ICD-10-CM

## 2018-03-08 HISTORY — DX: Laceration without foreign body of left hand, initial encounter: S61.412A

## 2018-03-08 MED ORDER — CEPHALEXIN 500 MG PO CAPS: 500.0000 mg | ORAL_CAPSULE | Freq: Three times a day (TID) | ORAL | 0 refills | Status: DC

## 2018-03-08 NOTE — Patient Instructions (Signed)
Keep wound gently clean with soap and water  Do not submerge Keep dressed  Take the keflex as directed   If more swelling or pain let us know  We will have you follow up with Dr Darnell Level next week

## 2018-03-08 NOTE — Progress Notes (Signed)
Subjective:    Patient ID: [name], male    DOB: 06/29/1941, 77 y.o.   MRN: [id number]  HPI Here for ED f/u for a hand laceration 77 yo pt of Dr Darnell Level  ED on 7/19 Fall from a swing - cut hand on chain  (swing broke)  Also hurt shoulder and hit head   Xray -neg CT head neg   Tetanus shot is utd   They used repaired wound L hand (dorsum)  4.5 cm  Steri strips and splint for protection   Ct Head Wo Contrast  Result Date: 03/02/2018 CLINICAL DATA:  Pain following fall EXAM: CT HEAD WITHOUT CONTRAST TECHNIQUE: Contiguous axial images were obtained from the base of the skull through the vertex without intravenous contrast. COMPARISON:  None. FINDINGS: Brain: There is mild diffuse atrophy. There is no intracranial mass, hemorrhage, extra-axial fluid collection, or midline shift. There is slight small vessel disease in the centra semiovale bilaterally. 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 Elsewhere gray-white compartments appear normal. No acute infarct is appreciable. Vascular: No hyperdense vessel. There is calcification in each carotid siphon and distal vertebral artery region. Skull: The bony calvarium appears intact. Sinuses/Orbits: Visualized paranasal sinuses are clear. Visualized orbits appear symmetric bilaterally. Other: Visualized mastoid air cells are clear. IMPRESSION: Mild diffuse atrophy with slight periventricular small vessel disease. 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 No mass or hemorrhage. Foci of arterial vascular calcification noted. Electronically Signed   By: Lowella Grip III M.D.   On: 03/02/2018 18:11   Dg Hand Complete Left  Result Date: 03/02/2018 CLINICAL DATA:  Fall from porch swing. Pain. Previous puncture wound 2 hand 1 month ago. EXAM: LEFT HAND - COMPLETE 3+ VIEW COMPARISON:  LEFT hand 02/10/2018. FINDINGS: Radiopaque densities on the ulnar  aspect of the LEFT hand adjacent to the fifth metacarpal were present previously and appear grossly unchanged in size and location. There is however significant soft tissue swelling associated with these radiopaque densities, which could represent local infection, or be incidental to the trauma which occurred today. There is no visible fracture or dislocation. IMPRESSION: Radiopaque densities on the ulnar aspect of the LEFT hand were present on the radiograph dated 02/10/2018, and appear unchanged except there is increased surrounding soft tissue swelling. No acute fracture is observed on today's exam Electronically Signed   By: Staci Righter M.D.   On: 03/02/2018 18:05   Dg Hand Complete Left  Result Date: 02/10/2018 CLINICAL DATA:  Puncture wound between the third and fourth digits from drill injury 2 hours ago, initial encounter EXAM: LEFT HAND - COMPLETE 3+ VIEW COMPARISON:  None. FINDINGS: There is no evidence of fracture or dislocation. There is no evidence of arthropathy or other focal bone abnormality. Soft tissues are unremarkable. Tiny densities are noted adjacent to the fifth metacarpal on the frontal film and in the palmar region on the lateral film. This may be related to the overlying bandage. This does not appear to be in the area of clinical concern. IMPRESSION: Densities identified as described likely related to the underlying bandages. No definitive bony abnormality is seen. Electronically Signed   By: Inez Catalina M.D.   On: 02/10/2018 16:59    Doing ok  Steri strips are still there Hand is swelling a bit  occ throbbing and  Draining also   Had a headache 2-3 d Took tylenol  Better now  Has appt Monday to have shoulder check (non fall related)   Patient Active Problem List   Diagnosis Date Noted  . Laceration of skin of left hand 03/08/2018  . Fall 03/08/2018  . Puncture wound of left hand without complication 18/84/1660  . Cervical stenosis of spinal canal 06/05/2017  .  Pedal edema 06/05/2017  . Health maintenance examination 12/06/2016  . 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 Overweight (BMI 25.0-29.9) 07/04/2016  . Low vitamin B12 level 01/01/2016  . Medicare annual wellness visit, subsequent 07/03/2015  . Advanced care planning/counseling discussion 07/03/2015  . Prediabetes 05/04/2015  . Angina pectoris ([place]) 05/04/2015  . Ex-smoker 05/04/2015  . Recurrent sinusitis 08/28/2014  . 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 Spermatocele 04/02/2013  . Syncopal vertigo 11/04/2010  . Lumbar disc disease with radiculopathy 11/04/2010  . Inguinal hernia 05/26/2010  . 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 Carotid stenosis 07/09/2008  . Elevated prostate specific antigen (PSA) 07/09/2008  . 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 Essential hypertension 04/30/2007  . GERD 04/30/2007  . Hyperlipidemia 04/26/2007  . OA (osteoarthritis) of knee 04/26/2007  . DVT, HX OF 04/26/2007   Past Medical History:  Diagnosis Date  . BENIGN PROSTATIC HYPERTROPHY, WITH URINARY OBSTRUCTION 09/06/2007  . 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 Community acquired pneumonia of right lower lobe of lung ([place]) 06/05/2017  . Diabetes mellitus without complication ([place])    diet controled per dr note  . 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 DVT, HX OF 1998  . GERD 04/30/2007  . Headache    hx migraines  . History of hiatal hernia   . History of shingles   . HYPERLIPIDEMIA 04/26/2007  . HYPERTENSION 04/30/2007  . INGUINAL HERNIA, RIGHT 05/26/2010  . LOC OSTEOARTHROS NOT SPEC PRIM/SEC LOWER LEG 11/05/2009  . Lumbar disc disease with radiculopathy   . Myocardial infarction ([place]) 1989  .  OSTEOARTHRITIS 04/26/2007  . 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 Pneumonia 07/2014   [facility] hospitalization  . PSA, INCREASED 07/09/2008  . Spinal stenosis of lumbar region    Past Surgical History:  Procedure Laterality Date  . APPENDECTOMY     rupture  . CARDIAC CATHETERIZATION [name] 07/07/2015   Procedure: Left Heart Cath and Cors/Grafts Angiography;  Surgeon: [name], MD;  Location: [facility];  Service: Cardiovascular;  Laterality: [name];  . CATARACT EXTRACTION, BILATERAL    . CERVICAL SPINE SURGERY  12/2016   cervical stenosis [name])  . COLONOSCOPY  03/2010   HP polyp, diverticulosis, rpt 10 yrs ([name])  . CORONARY ARTERY BYPASS GRAFT  1990   3 vessel   . CYSTOSCOPY  12/23/10   [facility]  . KNEE ARTHROSCOPY Right    x2  . LAMINOTOMY  1986   L5/S1 lumbar laminotomy for two ruptured discs/fusion  . LUMBAR LAMINECTOMY/DECOMPRESSION MICRODISCECTOMY [name] 07/13/2016   Procedure: LUMBAR TWO-THREE, LUMBAR THREE-FOUR, LUMBAR FOUR-FIVE LAMINECTOMY AND FORAMINOTOMY;  Surgeon: [name], MD;  Location: [facility];  Service: Neurosurgery;  Laterality: [name];  LAMINECTOMY AND FORAMINOTOMY L2-L3, L3-L4,L4-L5  . TOTAL HIP ARTHROPLASTY Bilateral 1999,2000  . TOTAL KNEE ARTHROPLASTY Right 03/06/2017   Procedure: RIGHT TOTAL KNEE ARTHROPLASTY;  Surgeon: Gaynelle Arabian, MD;  Location: [facility];  Service: Orthopedics;  Laterality: Right;  Social History   Tobacco Use  . Smoking status: Former Smoker    Packs/day: 1.00    Years: 25.00    Pack years: 25.00    Types: Cigarettes    Last attempt to quit: 08/15/1988    Years since quitting: 29.5  . Smokeless tobacco: Never Used  Substance Use Topics  . Alcohol use: No    Alcohol/week: 0.0 oz  . Drug use: No   Family History  Problem Relation Age of Onset  . Stroke Mother   . Heart attack Mother   . Diabetes Mother   . Stroke Father   . Heart disease Father   . Kidney disease Father        PCKD  . Cancer  Sister        throat  . Diabetes Brother   . Kidney disease Sister        PCKD  . Cancer Other        5/7 nephews with lung cancer   Allergies  Allergen Reactions  . Morphine Nausea Only and Other (See Comments)    Irritability   . Vioxx [Rofecoxib] Other (See Comments)    Bleeding out   Current Outpatient Medications on File Prior to Visit  Medication Sig Dispense Refill  . acetaminophen (TYLENOL) 500 MG tablet Take 2 tablets (1,000 mg total) by mouth as needed for moderate pain.    Marland Kitchen albuterol (PROVENTIL HFA;VENTOLIN HFA) 108 (90 Base) MCG/ACT inhaler Inhale 2 puffs into the lungs every 6 (six) hours as needed for wheezing or shortness of breath.    Marland Kitchen aspirin EC 81 MG tablet Take 1 tablet (81 mg total) by mouth daily. 90 tablet 3  . docusate sodium (COLACE) 100 MG capsule Take 1 capsule (100 mg total) by mouth 2 (two) times daily. 60 capsule 0  . ezetimibe (ZETIA) 10 MG tablet TAKE ONE TABLET BY MOUTH EVERY DAY 90 tablet 3  . finasteride (PROSCAR) 5 MG tablet Take 5 mg by mouth daily.      Marland Kitchen gabapentin (NEURONTIN) 300 MG capsule Take 300 mg by mouth 3 (three) times daily.    Marland Kitchen HYDROcodone-acetaminophen (NORCO/VICODIN) 5-325 MG tablet     . isosorbide mononitrate (IMDUR) 30 MG 24 hr tablet TAKE ONE TABLET EVERY DAY 90 tablet 1  . nitroGLYCERIN (NITROSTAT) 0.4 MG SL tablet Place 1 tablet (0.4 mg total) under the tongue every 5 (five) minutes as needed for chest pain. 25 tablet 6  . pantoprazole (PROTONIX) 40 MG tablet Take 40 mg by mouth daily.    Marland Kitchen tolterodine (DETROL LA) 4 MG 24 hr capsule Take 4 mg by mouth daily.    . simvastatin (ZOCOR) 40 MG tablet Take 1 tablet (40 mg total) by mouth at bedtime.     No current facility-administered medications on file prior to visit.      Review of Systems  Constitutional: Negative for activity change, appetite change, fatigue, fever and unexpected weight change.  HENT: Negative for congestion, rhinorrhea, sore throat and trouble  swallowing.   Eyes: Negative for pain, redness, itching and visual disturbance.  Respiratory: Negative for cough, chest tightness, shortness of breath and wheezing.   Cardiovascular: Negative for chest pain and palpitations.  Gastrointestinal: Negative for abdominal pain, blood in stool, constipation, diarrhea and nausea.  Endocrine: Negative for cold intolerance, heat intolerance, polydipsia and polyuria.  Genitourinary: Negative for difficulty urinating, dysuria, frequency and urgency.  Musculoskeletal: Positive for back pain. Negative for arthralgias, joint swelling and myalgias.  Chronic back pain   Bursitis in r shoulder -ongoing  Skin: Positive for wound. Negative for pallor and rash.  Neurological: Negative for dizziness, tremors, syncope, facial asymmetry, weakness, light-headedness, numbness and headaches.       Headache is resolved  Hematological: Negative for adenopathy. Does not bruise/bleed easily.  Psychiatric/Behavioral: Negative for decreased concentration and dysphoric mood. The patient is not nervous/anxious.        Objective:   Physical Exam  Constitutional: He is oriented to person, place, and time. He appears well-developed and well-nourished. No distress.  Well appearing  HENT:  Head: Normocephalic and atraumatic.  Mouth/Throat: Oropharynx is clear and moist.  No signs of head trauma   Eyes: Pupils are equal, round, and reactive to light. Conjunctivae and EOM are normal.  Neck: Normal range of motion. Neck supple.  Cardiovascular: Normal rate, regular rhythm and normal heart sounds.  Musculoskeletal: He exhibits edema and tenderness.  Some edema and tenderness surrounding wound on dorsal L hand  sens and perf intact  Lymphadenopathy:    He has no cervical adenopathy.  Neurological: He is alert and oriented to person, place, and time. No cranial nerve deficit or sensory deficit. He exhibits normal muscle tone.  Skin: Skin is warm and dry. No rash noted.    4-5 cm laceration of L dorsal hand with V shaped skin tear and steri strips in place  Some granulation tissue (golden clear d/c with scant blood on bandage) Mild tenderness Little to no redness Significant resolving ecchymosis  No weakness- some discomfort with flexing fingers or wrist due to pulling of skin   Re dressed with telfa pad and soft cotton wrap  Pt re applied splint   Psychiatric: He has a normal mood and affect.          Assessment & Plan:

## 2018-03-08 NOTE — Assessment & Plan Note (Addendum)
4-5 cm laceration with V shaped skin tear dorsal L hand  Reviewed hospital records, lab results and studies in detail   Steri strips still intact  Pt c/o of some swelling and pain  Wound looks good - scant drainage with blood  Will cover with keflex given nature of injury  inst to cleanse with soap /water-do not submerge Continue loose sterile dressing and splint  F/u with PCP next week for re check Disc s/s of infection to watch for  Will alert if inc in pain/swelling or any redness

## 2018-03-08 NOTE — Assessment & Plan Note (Signed)
Reviewed hospital records, lab results and studies in detail  Fall on 7/19 from a swing that broke -cutting L hand on chain and landing on ground with shoulder and head   CT head and xr hand reassuring  Hand re dressed and abx started today  utd tetanus shot Disc fall precautions  No longer has a headache- reassuring exam  For pcp f/u next week

## 2018-03-12 DIAGNOSIS — M7541 Impingement syndrome of right shoulder: Secondary | ICD-10-CM | POA: Diagnosis not present

## 2018-03-12 DIAGNOSIS — M25511 Pain in right shoulder: Secondary | ICD-10-CM | POA: Diagnosis not present

## 2018-03-14 ENCOUNTER — Ambulatory Visit: Payer: PPO | Attending: Neurosurgery

## 2018-03-14 DIAGNOSIS — M6281 Muscle weakness (generalized): Secondary | ICD-10-CM | POA: Diagnosis not present

## 2018-03-14 DIAGNOSIS — M545 Low back pain: Secondary | ICD-10-CM | POA: Diagnosis not present

## 2018-03-14 NOTE — Therapy (Signed)
[facility] [address] [place], [address] Phone: [phone]   Fax:  [phone]  Physical Therapy Evaluation  Patient Details  Name: [name] MRN: [id number] Date of Birth: 1940/09/15 Referring Provider: Frederich Cha   Encounter Date: 03/14/2018  PT End of Session - 03/14/18 1608    Visit Number  1    Number of Visits  13    Date for PT Re-Evaluation  04/25/18    Authorization Type  1 /10 Medicare    PT Start Time  1500    PT Stop Time  1600    PT Time Calculation (min)  60 min    Activity Tolerance  Patient tolerated treatment well    Behavior During Therapy  [facility] for tasks assessed/performed       Past Medical History:  Diagnosis Date  . BENIGN PROSTATIC HYPERTROPHY, WITH URINARY OBSTRUCTION 09/06/2007  . 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 Community acquired pneumonia of right lower lobe of lung ([place]) 06/05/2017  . Diabetes mellitus without complication ([place])    diet controled per dr note  . 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 DVT, HX OF 1998  . GERD 04/30/2007  . Headache    hx migraines  . History of hiatal hernia   . History of shingles   . HYPERLIPIDEMIA 04/26/2007  . HYPERTENSION 04/30/2007  . INGUINAL HERNIA, RIGHT 05/26/2010  . LOC OSTEOARTHROS NOT SPEC PRIM/SEC LOWER LEG 11/05/2009  . Lumbar disc disease with radiculopathy   . Myocardial infarction ([place]) 1989  . OSTEOARTHRITIS 04/26/2007  . 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 Pneumonia 07/2014   [facility] hospitalization  . PSA, INCREASED 07/09/2008  . Spinal stenosis of lumbar region     Past Surgical History:  Procedure Laterality Date  . APPENDECTOMY     rupture  . CARDIAC CATHETERIZATION [name] 07/07/2015   Procedure: Left Heart Cath and Cors/Grafts Angiography;  Surgeon: [name], MD;  Location: [facility];  Service: Cardiovascular;  Laterality: [name];  . CATARACT EXTRACTION, BILATERAL    . CERVICAL SPINE SURGERY  12/2016   cervical stenosis [name])  . COLONOSCOPY  03/2010   HP polyp, diverticulosis, rpt 10 yrs ([name])  . CORONARY ARTERY BYPASS GRAFT  1990   3 vessel   . CYSTOSCOPY  12/23/10   Cope  . KNEE ARTHROSCOPY Right    x2  . LAMINOTOMY  1986   L5/S1 lumbar laminotomy for two ruptured discs/fusion  . LUMBAR LAMINECTOMY/DECOMPRESSION MICRODISCECTOMY [name] 07/13/2016   Procedure: LUMBAR TWO-THREE, LUMBAR THREE-FOUR, LUMBAR FOUR-FIVE LAMINECTOMY AND FORAMINOTOMY;  Surgeon: [name], MD;  Location: [facility];  Service: Neurosurgery;  Laterality: [name];  LAMINECTOMY AND FORAMINOTOMY L2-L3, L3-L4,L4-L5  . TOTAL HIP ARTHROPLASTY Bilateral 1999,2000  . TOTAL KNEE ARTHROPLASTY Right 03/06/2017   Procedure: RIGHT TOTAL KNEE ARTHROPLASTY;  Surgeon: Gaynelle Arabian, MD;  Location: [facility];  Service: Orthopedics;  Laterality: Right;    There were no vitals filed for this visit.   Subjective Assessment - 03/14/18 1510    Subjective  Patient reports 4 months ago he was biking in the gym and reports he "blew out his back" during the performance. Patient states increased low back pain with pick something off the ground, bending over, biking, sitting, riding in the chair, mowing the land (has to decrease speed with extra cushioning). Patient states decreased pain after  taking medication but tires to avoid taking medication whenever possible. Sleeping has improved since the onset of the injury. Patient reports the pain in the back is worse with activity such as walking on hard surfaces. Patient states the pain is worse at the end of the day.      Pertinent History  L hand laceration post fall from a swing in which he fell on his shoulder. Neurolopathy, vascular deficiency B LE, LBP since march 2019, TKA on the R , B THA,     Limitations  Lifting;Sitting    Diagnostic tests  MRI: stenosis,      Patient Stated Goals  Return to activities without pain    Currently in Pain?  Yes worst: 8/10; best: 3/10    Pain Score  4     Pain Location  Back    Pain Orientation  Right    Pain Descriptors / Indicators  Aching    Pain Type  Acute pain    Pain Radiating Towards  Along the R groin    Pain Frequency  Constant         [facility] PT Assessment - 03/14/18 0001      Assessment   Medical Diagnosis  LBP    Referring Provider  Frederich Cha    Onset Date/Surgical Date  10/13/17    Hand Dominance  Right    Next MD Visit  unknown    Prior Therapy  Yes for R knee      Balance Screen   Has the patient fallen in the past 6 months  Yes    How many times?  1    Has the patient had a decrease in activity level because of a fear of falling?   Yes    Is the patient reluctant to leave their home because of a fear of falling?   No      Home Film/video editor residence    Living Arrangements  Spouse/significant other    Available Help at Discharge  Family    Type of [place]      Prior Function   Level of [place]  Retired    Biomedical scientist  [name]    [place]   Overall Cognitive Status  Within Functional Limits for tasks assessed      Observation/Other Assessments   Observations  Increased hip flexion and lumbar flexion in standing neutral       Sensation   Light Touch  Appears Intact      Functional Tests   Functional tests  Squat      Squat   Comments  Decent technique with use of hands; increased pain and decreased ant pelvic tilt in bottom position      ROM / Strength   AROM / PROM / Strength  AROM;Strength      AROM   AROM Assessment Site  Hip;Lumbar    Right/Left Hip  Right;Left    Right Hip Extension  0    Right Hip Flexion  105 increased pain     Right Hip External Rotation   10 increased pain    Right Hip Internal Rotation   25    Right Hip ABduction  40    Right Hip  ADduction  20    Left Hip Extension  0    Left Hip Flexion  115    Left  Hip External Rotation   15    Left Hip Internal Rotation   30    Left Hip ABduction  40    Left Hip ADduction  10    Lumbar Flexion  25% limited increased pain with performance    Lumbar Extension  66% limited increased pain    Lumbar - Right Side Bend  50% limited    Lumbar - Left Side Bend  50% limited increased pain on the R side    Lumbar - Right Rotation  50% limited    Lumbar - Left Rotation  50% limited      Strength   Strength Assessment Site  Hip;Knee;Lumbar    Right/Left Hip  Right;Left    Right Hip Flexion  4/5    Right Hip Extension  3+/5    Right Hip External Rotation   3/5    Right Hip Internal Rotation  4/5    Right Hip ABduction  4/5    Right Hip ADduction  5/5    Left Hip Flexion  4+/5    Left Hip Extension  4/5    Left Hip External Rotation  4/5    Left Hip Internal Rotation  4/5    Left Hip ABduction  4+/5    Left Hip ADduction  5/5    Right/Left Knee  Left;Right    Right Knee Flexion  5/5    Right Knee Extension  4+/5    Left Knee Flexion  4+/5    Left Knee Extension  5/5    Lumbar Flexion  4+/5    Lumbar Extension  4/5      Palpation   Spinal mobility  S1 increased pain and hypomobility; L1-2 hypomobility      Palpation comment  Increased TTP: glute max, unilaterals along lumbar on the R       Special Tests    Special Tests  Lumbar    Lumbar Tests  Straight Leg Raise      Straight Leg Raise   Findings  Positive    Side   Right    Comment  Increase dpain       Ambulation/Gait   Gait Pattern  Decreased stance time - right       Objective measurements completed on examination: See above findings.    TREATMENT Therapeutic Exercise:  Bridges -- x 10 in supine Standing hip extension/abuction -- x 10 B Mini squat performing with UE support -- x 15 with cueing   Patient demonstrates no increase in pain at the end of the session         PT Education - 03/14/18  1607    Education Details  HEP: standing hip extension/abd, mini squats, bridges    Person(s) Educated  Patient    Methods  Explanation;Demonstration;Handout    Comprehension  Verbalized understanding;Returned demonstration          PT Long Term Goals - 03/14/18 1614      PT LONG TERM GOAL #1   Title  Patient will be independent with HEP to continue benefits of therapy until after discharge.     Baseline  Dependent with exercise performance and progression    Time  4    Period  Weeks    Status  New    Target Date  04/11/18      PT LONG TERM GOAL #2   Title  Patient will have a worst pain score of 4/10 in the past week and with activities to more  comfortably be able to lift items from the floor.     Baseline  7/10 pain at worst     Time  6    Period  Weeks    Status  New    Target Date  04/25/18      PT LONG TERM GOAL #3   Title  Patient will be able to lift 50# without increase in pain to improve abililty to perform household chores.     Baseline  Unable to lift any weights from the ground without increase in pain.     Time  6    Period  Weeks    Status  New    Target Date  04/25/18      PT LONG TERM GOAL #4   Title  Pt will improve ability to walk for 10 min without increase in pain to improve ability to perform household activities and walk community distances.     Baseline  Increased pain after 2 min of walking    Time  6    Period  Weeks    Status  New    Target Date  04/25/18             Plan - 03/14/18 1609    Clinical Impression Statement  Patient is a 77 yo male presenting with R sided LBP insidious onset after riding on a stationary bike 4 months ago. Patient demonstrates increased lumbar dysfunction as indicated by decreased lumbar mobility, poor motor control, increased NPRS scores, difficulty with standing such as performing heavy activities around the home, and difficulty/pain with lifitng and bending. Patient demonstrates decreased strength and AROM  with all extension positions most notably with hip and lumbar motions. Patient will benefit from further skilled therapy to return to prior level of function.     History and Personal Factors relevant to plan of care:  Prior TKA on the R, B THA, chronic LBP    Clinical Presentation  Evolving    Clinical Presentation due to:  Pain not improving     Clinical Decision Making  Moderate    Rehab Potential  Fair    Clinical Impairments Affecting Rehab Potential  (+) highly motivated (-) preexisting conditions/previous surgeries    PT Frequency  2x / week    PT Duration  6 weeks    PT Treatment/Interventions  Manual techniques;Neuromuscular re-education;Aquatic Therapy;Cryotherapy;Electrical Stimulation;Moist Heat;Iontophoresis 4mg /ml Dexamethasone;Gait training;Stair training;Therapeutic activities;Therapeutic exercise;Balance training;Patient/family education;Passive range of motion;Dry needling    PT Next Visit Plan  progress strengthening and coordination exercises     PT Home Exercise Plan  see education section    Consulted and Agree with Plan of Care  Patient       Patient will benefit from skilled therapeutic intervention in order to improve the following deficits and impairments:  Decreased endurance, Decreased range of motion, Decreased strength, Decreased balance, Difficulty walking, Decreased coordination, Postural dysfunction, Pain  Visit Diagnosis: Muscle weakness (generalized)  Right low back pain, unspecified chronicity, with sciatica presence unspecified     Problem List Patient Active Problem List   Diagnosis Date Noted  . Laceration of skin of left hand 03/08/2018  . Fall 03/08/2018  . Puncture wound of left hand without complication 93/79/0240  . Cervical stenosis of spinal canal 06/05/2017  . Pedal edema 06/05/2017  . Health maintenance examination 12/06/2016  . 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 Overweight (BMI 25.0-29.9) 07/04/2016  . Low  vitamin B12 level 01/01/2016  . Medicare annual wellness  visit, subsequent 07/03/2015  . Advanced care planning/counseling discussion 07/03/2015  . Prediabetes 05/04/2015  . Angina pectoris ([place]) 05/04/2015  . Ex-smoker 05/04/2015  . Recurrent sinusitis 08/28/2014  . 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 Spermatocele 04/02/2013  . Syncopal vertigo 11/04/2010  . Lumbar disc disease with radiculopathy 11/04/2010  . Inguinal hernia 05/26/2010  . 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 PVD (peripheral vascular disease) with claudication ([place]) 08/11/2009  . 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 Carotid stenosis 07/09/2008  . Elevated prostate specific antigen (PSA) 07/09/2008  . 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 Essential hypertension 04/30/2007  . GERD 04/30/2007  . Hyperlipidemia 04/26/2007  . OA (osteoarthritis) of knee 04/26/2007  . DVT, HX OF 04/26/2007    Blythe Stanford, PT DPT 03/14/2018, 5:41 PM  [facility] [address], [place], [address] Phone: [phone]   Fax:  [phone]  Name: MAXWEL MEADOWCROFT MRN: [id number] Date of Birth: Aug 04, 1941

## 2018-03-15 ENCOUNTER — Ambulatory Visit (INDEPENDENT_AMBULATORY_CARE_PROVIDER_SITE_OTHER): Payer: PPO | Admitting: Family Medicine

## 2018-03-15 ENCOUNTER — Encounter: Payer: Self-pay | Admitting: Family Medicine

## 2018-03-15 VITALS — BP 132/62 | HR 68 | Temp 98.2°F | Ht 73.0 in | Wt 209.2 lb

## 2018-03-15 DIAGNOSIS — S61412S Laceration without foreign body of left hand, sequela: Secondary | ICD-10-CM

## 2018-03-15 NOTE — Assessment & Plan Note (Addendum)
Seems to be healing adequately. Wound care instructions reviewed. Continue home care as up to now. Finishing keflex course. Wound re dressed today.  RTC 10d wound check.

## 2018-03-15 NOTE — Patient Instructions (Addendum)
Wound is healing ok Continue dressing changes.  Return in 10 days for wound check, sooner if needed.

## 2018-03-15 NOTE — Progress Notes (Signed)
BP 132/62 (BP Location: Right Arm, Patient Position: Sitting, Cuff Size: Normal)   Pulse 68   Temp 98.2 F (36.8 C) (Oral)   Ht 6\' 1"  (1.854 m)   Wt 209 lb 4 oz (94.9 kg)   SpO2 95%   BMI 27.61 kg/m    CC: f/u laceration Subjective:    Patient ID: [name], male    DOB: 1940-12-25, 77 y.o.   MRN: [id number]  HPI: [name] is a 77 y.o. male presenting on 03/15/2018 for Wound Check (Here for f/u of laceration on left hand. States swelling has gone down and today is last day of Keflex.)   See prior note for details. Recent records reviewed. DOI: 03/02/2018 - laceration to L dorsal hand. Seen at ER, treated with tissue adhesive and steri strips. Xray of L hand and head CT were stable.   Saw Dr Glori Bickers last week - placed on keflex.   Upcoming microdiscectomy.  Relevant past medical, surgical, family and social history reviewed and updated as indicated. Interim medical history since our last visit reviewed. Allergies and medications reviewed and updated. Outpatient Medications Prior to Visit  Medication Sig Dispense Refill  . acetaminophen (TYLENOL) 500 MG tablet Take 2 tablets (1,000 mg total) by mouth as needed for moderate pain.    Marland Kitchen albuterol (PROVENTIL HFA;VENTOLIN HFA) 108 (90 Base) MCG/ACT inhaler Inhale 2 puffs into the lungs every 6 (six) hours as needed for wheezing or shortness of breath.    Marland Kitchen aspirin EC 81 MG tablet Take 1 tablet (81 mg total) by mouth daily. 90 tablet 3  . cephALEXin (KEFLEX) 500 MG capsule Take 1 capsule (500 mg total) by mouth 3 (three) times daily. 21 capsule 0  . docusate sodium (COLACE) 100 MG capsule Take 1 capsule (100 mg total) by mouth 2 (two) times daily. 60 capsule 0  . ezetimibe (ZETIA) 10 MG tablet TAKE ONE TABLET BY MOUTH EVERY DAY 90 tablet 3  . finasteride (PROSCAR) 5 MG tablet Take 5 mg by mouth daily.      Marland Kitchen gabapentin (NEURONTIN) 300 MG capsule Take 300 mg by mouth 3 (three) times daily.    Marland Kitchen HYDROcodone-acetaminophen  (NORCO/VICODIN) 5-325 MG tablet     . isosorbide mononitrate (IMDUR) 30 MG 24 hr tablet TAKE ONE TABLET EVERY DAY 90 tablet 1  . nitroGLYCERIN (NITROSTAT) 0.4 MG SL tablet Place 1 tablet (0.4 mg total) under the tongue every 5 (five) minutes as needed for chest pain. 25 tablet 6  . pantoprazole (PROTONIX) 40 MG tablet Take 40 mg by mouth daily.    Marland Kitchen tolterodine (DETROL LA) 4 MG 24 hr capsule Take 4 mg by mouth daily.    . simvastatin (ZOCOR) 40 MG tablet Take 1 tablet (40 mg total) by mouth at bedtime.     No facility-administered medications prior to visit.      Per HPI unless specifically indicated in ROS section below Review of Systems     Objective:    BP 132/62 (BP Location: Right Arm, Patient Position: Sitting, Cuff Size: Normal)   Pulse 68   Temp 98.2 F (36.8 C) (Oral)   Ht 6\' 1"  (1.854 m)   Wt 209 lb 4 oz (94.9 kg)   SpO2 95%   BMI 27.61 kg/m   Wt Readings from Last 3 Encounters:  03/15/18 209 lb 4 oz (94.9 kg)  03/08/18 208 lb 8 oz (94.6 kg)  03/02/18 206 lb (93.4 kg)    Physical Exam  Constitutional: He appears well-developed and well-nourished. No distress.  Musculoskeletal: He exhibits no edema.  2+ rad pulses  Neurological:  Sensation of hands intact  Skin: Skin is warm and dry.  Large skin tear L dorsal hand without erythema or significant drainage, steri strips still present c/d/i  Nursing note and vitals reviewed.     Assessment & Plan:   Problem List Items Addressed This Visit    Laceration of skin of left hand - Primary    Seems to be healing adequately. Wound care instructions reviewed. Continue home care as up to now. Finishing keflex course. Wound re dressed today.  RTC 10d wound check.           No orders of the defined types were placed in this encounter.  No orders of the defined types were placed in this encounter.   Follow up plan: Return in about 10 days (around 03/25/2018) for follow up visit.  Ria Bush, MD

## 2018-03-20 ENCOUNTER — Ambulatory Visit: Payer: PPO | Attending: Neurosurgery

## 2018-03-20 DIAGNOSIS — R2681 Unsteadiness on feet: Secondary | ICD-10-CM | POA: Insufficient documentation

## 2018-03-20 DIAGNOSIS — M6281 Muscle weakness (generalized): Secondary | ICD-10-CM | POA: Diagnosis not present

## 2018-03-20 DIAGNOSIS — M25561 Pain in right knee: Secondary | ICD-10-CM | POA: Diagnosis not present

## 2018-03-20 DIAGNOSIS — G8929 Other chronic pain: Secondary | ICD-10-CM

## 2018-03-20 DIAGNOSIS — M545 Low back pain: Secondary | ICD-10-CM | POA: Diagnosis not present

## 2018-03-20 NOTE — Therapy (Signed)
[facility] [address] [place], [address] Phone: [phone]   Fax:  [phone]  Physical Therapy Treatment  Patient Details  Name: [name] MRN: [id number] Date of Birth: [birth year] Referring Provider: Frederich Cha   Encounter Date: 03/20/2018  PT End of Session - 03/20/18 1126    Visit Number  2    Number of Visits  13    Date for PT Re-Evaluation  04/25/18    Authorization Type  2/10 Medicare    PT Start Time  1116    PT Stop Time  1200    PT Time Calculation (min)  44 min    Activity Tolerance  Patient tolerated treatment well    Behavior During Therapy  [facility] for tasks assessed/performed       Past Medical History:  Diagnosis Date  . BENIGN PROSTATIC HYPERTROPHY, WITH URINARY OBSTRUCTION 09/06/2007  . 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 Community acquired pneumonia of right lower lobe of lung ([place]) 06/05/2017  . Diabetes mellitus without complication ([place])    diet controled per dr note  . 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 DVT, HX OF 1998  . GERD 04/30/2007  . Headache    hx migraines  . History of hiatal hernia   . History of shingles   . HYPERLIPIDEMIA 04/26/2007  . HYPERTENSION 04/30/2007  . INGUINAL HERNIA, RIGHT 05/26/2010  . LOC OSTEOARTHROS NOT SPEC PRIM/SEC LOWER LEG 11/05/2009  . Lumbar disc disease with radiculopathy   . Myocardial infarction ([place]) 1989  . OSTEOARTHRITIS 04/26/2007  . 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 Pneumonia 07/2014   [facility] hospitalization  . PSA, INCREASED 07/09/2008  . Spinal stenosis of lumbar region     Past Surgical History:  Procedure Laterality Date  . APPENDECTOMY     rupture  . CARDIAC CATHETERIZATION [name] 07/07/2015   Procedure: Left Heart Cath and Cors/Grafts Angiography;  Surgeon: [name], MD;  Location: [facility];  Service: Cardiovascular;  Laterality: [name];  . CATARACT EXTRACTION, BILATERAL    . CERVICAL SPINE SURGERY  12/2016   cervical stenosis [name])  . COLONOSCOPY  03/2010   HP polyp, diverticulosis, rpt 10 yrs ([name])  . CORONARY ARTERY BYPASS GRAFT  1990   3 vessel   . CYSTOSCOPY  12/23/10   [facility]ope  . KNEE ARTHROSCOPY Right    x2  . LAMINOTOMY  1986   L5/S1 lumbar laminotomy for two ruptured discs/fusion  . LUMBAR LAMINECTOMY/DECOMPRESSION MICRODISCECTOMY [name] 07/13/2016   Procedure: LUMBAR TWO-THREE, LUMBAR THREE-FOUR, LUMBAR FOUR-FIVE LAMINECTOMY AND FORAMINOTOMY;  Surgeon: [name], MD;  Location: [facility];  Service: Neurosurgery;  Laterality: [name];  LAMINECTOMY AND FORAMINOTOMY L2-L3, L3-L4,L4-L5  . TOTAL HIP ARTHROPLASTY Bilateral 1999,2000  . TOTAL KNEE ARTHROPLASTY Right 03/06/2017   Procedure: RIGHT TOTAL KNEE ARTHROPLASTY;  Surgeon: Gaynelle Arabian, MD;  [facility];  Service: Orthopedics;  Laterality: Right;    There were no vitals filed for this visit.  Subjective Assessment - 03/20/18 1118    Subjective  Patient reports that back felt "rough" after initial evaluation. Over the weekend pt states that pain incr up to 8-9/10 depending on the activities he was doing and would ease if he was walking around.     Pertinent History  L hand laceration post fall from a swing in which he fell on his shoulder. Neurolopathy,  vascular deficiency B LE, LBP since march 2019, TKA on the R , B THA,     Limitations  Lifting;Sitting    Diagnostic tests  MRI: stenosis,     Patient Stated Goals  Return to activities without pain    Currently in Pain?  Yes    Pain Score  5     Pain Location  Back    Pain Orientation  Right    Pain Descriptors / Indicators  Aching    Pain Type  Acute pain    Pain Radiating Towards  Along R groin    Pain Frequency  Constant       TREATMENT  Manual Therapy Sustained pressure STM on hip flexor insertion on anterior pelvis x5 min Grade II  mobs to S1, pt in prone with 3 pillows underneath hips 30s x5  Grade II mobilization to L5 TP on R unilateral, pt in prone with 3 pillows underneath hips 30s x STM to upper aspect of gluteus maximus and R lumbar paraspinals/multifidus on R, pt prone with 3 pillows underneath hips x5 min  Therapeutic Exercises LTRs with assistance from PT x20 PROM hip flexion/ext with patient in hookyling to decr pain and muscle spasms ~3 min total Isolated isometric hip abduction with pt supine and RLE held into slight hip/knee flexion x10 Hooklying hip adduction isometric squeezes 3s hold x10 Hooklying hip abd with belt around distal thighs + glute squeeze w/3s hold x10 Hooklying hip abd + glute squeeze + bridge in pain free range x10 Ant/post pelvic tilts (verbal and tactile cueing) x20 Seated resisted hip extension with knee flexed  5s hold x10 Physio ball mini wall squats x10 (added to HEP)  Decr pain after manual therapy and therapeutic exercises. (3-4/10 compared to 5-6/10)      PT Education - 03/20/18 1125    Education Details  Patient educated on proper form and tehcnique for therapeutic exercises.    Person(s) Educated  Patient    Methods  Explanation;Demonstration    Comprehension  Verbalized understanding;Returned demonstration          PT Long Term Goals - 03/14/18 1614      PT LONG TERM GOAL #1   Title  Patient will be independent with HEP to continue benefits of therapy until after discharge.     Baseline  Dependent with exercise performance and progression    Time  4    Period  Weeks    Status  New    Target Date  04/11/18      PT LONG TERM GOAL #2   Title  Patient will have a worst pain score of 4/10 in the past week and with activities to more comfortably be able to lift items from the floor.     Baseline  7/10 pain at worst     Time  6    Period  Weeks    Status  New    Target Date  04/25/18      PT LONG TERM GOAL #3   Title  Patient will be able to lift 50#  without increase in pain to improve abililty to perform household chores.     Baseline  Unable to lift any weights from the ground without increase in pain.     Time  6    Period  Weeks    Status  New    Target Date  04/25/18      PT LONG TERM GOAL #4   Title  Pt  will improve ability to walk for 10 min without increase in pain to improve ability to perform household activities and walk community distances.     Baseline  Increased pain after 2 min of walking    Time  6    Period  Weeks    Status  New    Target Date  04/25/18            Plan - 03/20/18 1242    Clinical Impression Statement  Patient presents with decreased tolerance to any position that increases lumbar extension. Patient also presents with increased tendenness to palpation along lumbosacral spine and R upper gluteus maximus insertion. Pt requires significant verbal and tactile cueing for proper technique for anterior/posterior pelvic tilting exercise. Pt will benefit from continued skilled PT in order to return to prior level of function.    Rehab Potential  Fair    Clinical Impairments Affecting Rehab Potential  (+) highly motivated (-) preexisting conditions/previous surgeries    PT Frequency  2x / week    PT Duration  6 weeks    PT Treatment/Interventions  Manual techniques;Neuromuscular re-education;Aquatic Therapy;Cryotherapy;Electrical Stimulation;Moist Heat;Iontophoresis 4mg /ml Dexamethasone;Gait training;Stair training;Therapeutic activities;Therapeutic exercise;Balance training;Patient/family education;Passive range of motion;Dry needling    PT Next Visit Plan  progress strengthening and coordination exercises     PT Home Exercise Plan  see education section    Consulted and Agree with Plan of Care  Patient       Patient will benefit from skilled therapeutic intervention in order to improve the following deficits and impairments:  Decreased endurance, Decreased range of motion, Decreased strength, Decreased  balance, Difficulty walking, Decreased coordination, Postural dysfunction, Pain  Visit Diagnosis: Muscle weakness (generalized)  Right low back pain, unspecified chronicity, with sciatica presence unspecified  Chronic pain of right knee  Unsteadiness on feet     Problem List Patient Active Problem List   Diagnosis Date Noted  . Laceration of skin of left hand 03/08/2018  . Fall 03/08/2018  . Cervical stenosis of spinal canal 06/05/2017  . Pedal edema 06/05/2017  . Health maintenance examination 12/06/2016  . 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 Overweight (BMI 25.0-29.9) 07/04/2016  . Low vitamin B12 level 01/01/2016  . Medicare annual wellness visit, subsequent 07/03/2015  . Advanced care planning/counseling discussion 07/03/2015  . Prediabetes 05/04/2015  . Angina pectoris ([place]) 05/04/2015  . Ex-smoker 05/04/2015  . Recurrent sinusitis 08/28/2014  . 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 Spermatocele 04/02/2013  . Syncopal vertigo 11/04/2010  . Lumbar disc disease with radiculopathy 11/04/2010  . Inguinal hernia 05/26/2010  . 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 PVD (peripheral vascular disease) with claudication ([place]) 08/11/2009  . 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 Carotid stenosis 07/09/2008  . Elevated prostate specific antigen (PSA) 07/09/2008  . 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 Essential hypertension 04/30/2007  . GERD 04/30/2007  . Hyperlipidemia 04/26/2007  . OA (osteoarthritis) of knee 04/26/2007  . DVT, HX OF 04/26/2007    Georg Ruddle, SPT 03/20/2018, 2:22 PM  [facility] [address] [place], [address] Phone: [phone]   Fax:  [phone]  Name: GEO SLONE MRN: [id number] Date of Birth: Mar 15, 1941

## 2018-03-21 ENCOUNTER — Ambulatory Visit: Payer: PPO

## 2018-03-26 ENCOUNTER — Encounter: Payer: Self-pay | Admitting: Family Medicine

## 2018-03-26 ENCOUNTER — Ambulatory Visit (INDEPENDENT_AMBULATORY_CARE_PROVIDER_SITE_OTHER): Payer: PPO | Admitting: Family Medicine

## 2018-03-26 VITALS — BP 132/60 | HR 83 | Temp 97.8°F | Ht 73.0 in | Wt 207.0 lb

## 2018-03-26 DIAGNOSIS — S61412D Laceration without foreign body of left hand, subsequent encounter: Secondary | ICD-10-CM | POA: Diagnosis not present

## 2018-03-26 NOTE — Patient Instructions (Signed)
Wound continues healing well! Continue treatment as up to now.  Wash with soapy water, and dress with antibiotic ointment until scab falls off/fully heals.  Watch for streaking redness or worsening pain/ heat and let us know if that happens.

## 2018-03-26 NOTE — Assessment & Plan Note (Signed)
Continues healing well - significant improvement in last 10 days. Reviewed home wound care. RTC PRN.

## 2018-03-26 NOTE — Progress Notes (Signed)
BP 132/60 (BP Location: Left Arm, Patient Position: Sitting, Cuff Size: Normal)   Pulse 83   Temp 97.8 F (36.6 C) (Oral)   Ht 6\' 1"  (1.854 m)   Wt 207 lb (93.9 kg)   SpO2 95%   BMI 27.31 kg/m    CC: 10d wound check Subjective:    Patient ID: [name], male    DOB: 12-12-1940, 77 y.o.   MRN: [id number]  HPI: [name] is a 77 y.o. male presenting on 03/26/2018 for Wound Check (Here for 10-day wound chk.)   See prior note for details.  DOI: 03/02/2018 - laceration to L dorsal hand.   Back is doing well - feels PT session is helping.   Relevant past medical, surgical, family and social history reviewed and updated as indicated. Interim medical history since our last visit reviewed. Allergies and medications reviewed and updated. Outpatient Medications Prior to Visit  Medication Sig Dispense Refill  . acetaminophen (TYLENOL) 500 MG tablet Take 2 tablets (1,000 mg total) by mouth as needed for moderate pain.    Marland Kitchen albuterol (PROVENTIL HFA;VENTOLIN HFA) 108 (90 Base) MCG/ACT inhaler Inhale 2 puffs into the lungs every 6 (six) hours as needed for wheezing or shortness of breath.    Marland Kitchen aspirin EC 81 MG tablet Take 1 tablet (81 mg total) by mouth daily. 90 tablet 3  . docusate sodium (COLACE) 100 MG capsule Take 1 capsule (100 mg total) by mouth 2 (two) times daily. 60 capsule 0  . ezetimibe (ZETIA) 10 MG tablet TAKE ONE TABLET BY MOUTH EVERY DAY 90 tablet 3  . finasteride (PROSCAR) 5 MG tablet Take 5 mg by mouth daily.      Marland Kitchen gabapentin (NEURONTIN) 300 MG capsule Take 300 mg by mouth 3 (three) times daily.    Marland Kitchen HYDROcodone-acetaminophen (NORCO/VICODIN) 5-325 MG tablet     . isosorbide mononitrate (IMDUR) 30 MG 24 hr tablet TAKE ONE TABLET EVERY DAY 90 tablet 1  . nitroGLYCERIN (NITROSTAT) 0.4 MG SL tablet Place 1 tablet (0.4 mg total) under the tongue every 5 (five) minutes as needed for chest pain. 25 tablet 6  . pantoprazole (PROTONIX) 40 MG tablet Take 40 mg by mouth  daily.    Marland Kitchen tolterodine (DETROL LA) 4 MG 24 hr capsule Take 4 mg by mouth daily.    . simvastatin (ZOCOR) 40 MG tablet Take 1 tablet (40 mg total) by mouth at bedtime.    . cephALEXin (KEFLEX) 500 MG capsule Take 1 capsule (500 mg total) by mouth 3 (three) times daily. 21 capsule 0   No facility-administered medications prior to visit.      Per HPI unless specifically indicated in ROS section below Review of Systems     Objective:    BP 132/60 (BP Location: Left Arm, Patient Position: Sitting, Cuff Size: Normal)   Pulse 83   Temp 97.8 F (36.6 C) (Oral)   Ht 6\' 1"  (1.854 m)   Wt 207 lb (93.9 kg)   SpO2 95%   BMI 27.31 kg/m   Wt Readings from Last 3 Encounters:  03/26/18 207 lb (93.9 kg)  03/15/18 209 lb 4 oz (94.9 kg)  03/08/18 208 lb 8 oz (94.6 kg)    Physical Exam  Constitutional: He appears well-developed and well-nourished. No distress.  Musculoskeletal: He exhibits no edema.  Skin: Skin is warm and dry. No erythema. No pallor.  1x2.25cm scab L dorsal hand - improved from last visit No surrounding erythema, no drainage.  Nursing note and vitals reviewed.     Assessment & Plan:   Problem List Items Addressed This Visit    Laceration of skin of left hand - Primary    Continues healing well - significant improvement in last 10 days. Reviewed home wound care. RTC PRN.           No orders of the defined types were placed in this encounter.  No orders of the defined types were placed in this encounter.   Follow up plan: Return if symptoms worsen or fail to improve.  Ria Bush, MD

## 2018-04-03 ENCOUNTER — Encounter

## 2018-04-03 ENCOUNTER — Ambulatory Visit: Payer: PPO | Admitting: Cardiovascular Disease

## 2018-04-04 ENCOUNTER — Ambulatory Visit: Payer: PPO

## 2018-04-04 DIAGNOSIS — M6281 Muscle weakness (generalized): Secondary | ICD-10-CM | POA: Diagnosis not present

## 2018-04-04 DIAGNOSIS — M25561 Pain in right knee: Secondary | ICD-10-CM

## 2018-04-04 DIAGNOSIS — R2681 Unsteadiness on feet: Secondary | ICD-10-CM

## 2018-04-04 DIAGNOSIS — M545 Low back pain: Secondary | ICD-10-CM

## 2018-04-04 DIAGNOSIS — G8929 Other chronic pain: Secondary | ICD-10-CM

## 2018-04-04 NOTE — Therapy (Signed)
[facility] [address], [place], [address] Phone: [phone]   Fax:  [phone]  Physical Therapy Treatment  Patient Details  Name: [name] MRN: [id number] Date of Birth: 1941-03-30 Referring Provider: Frederich Cha   Encounter Date: 04/04/2018  PT End of Session - 04/04/18 1620    Visit Number  3    Number of Visits  13    Date for PT Re-Evaluation  04/25/18    Authorization Type  3/10 Medicare    PT Start Time  1605    PT Stop Time  0354    PT Time Calculation (min)  40 min    Activity Tolerance  Patient tolerated treatment well    Behavior During Therapy  [facility] for tasks assessed/performed       Past Medical History:  Diagnosis Date  . BENIGN PROSTATIC HYPERTROPHY, WITH URINARY OBSTRUCTION 09/06/2007  . 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 Community acquired pneumonia of right lower lobe of lung ([place]) 06/05/2017  . Diabetes mellitus without complication ([place])    diet controled per dr note  . 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 DVT, HX OF 1998  . GERD 04/30/2007  . Headache    hx migraines  . History of hiatal hernia   . History of shingles   . HYPERLIPIDEMIA 04/26/2007  . HYPERTENSION 04/30/2007  . INGUINAL HERNIA, RIGHT 05/26/2010  . LOC OSTEOARTHROS NOT SPEC PRIM/SEC LOWER LEG 11/05/2009  . Lumbar disc disease with radiculopathy   . Myocardial infarction ([place]) 1989  . OSTEOARTHRITIS 04/26/2007  . 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 Pneumonia 07/2014   [facility] hospitalization  . PSA, INCREASED 07/09/2008  . Spinal stenosis of lumbar region     Past Surgical History:  Procedure Laterality Date  . APPENDECTOMY     rupture  . CARDIAC CATHETERIZATION [name] 07/07/2015   Procedure: Left Heart Cath and Cors/Grafts Angiography;  Surgeon: [name], MD;  Location: [facility];  Service: Cardiovascular;  Laterality: [name];  . CATARACT EXTRACTION, BILATERAL    . CERVICAL SPINE SURGERY  12/2016   cervical stenosis [name])  . COLONOSCOPY  03/2010   HP polyp, diverticulosis, rpt 10 yrs ([name])  . CORONARY ARTERY BYPASS GRAFT  1990   3 vessel   . CYSTOSCOPY  12/23/10   Cope  . KNEE ARTHROSCOPY Right    x2  . LAMINOTOMY  1986   L5/S1 lumbar laminotomy for two ruptured discs/fusion  . LUMBAR LAMINECTOMY/DECOMPRESSION MICRODISCECTOMY [name] 07/13/2016   Procedure: LUMBAR TWO-THREE, LUMBAR THREE-FOUR, LUMBAR FOUR-FIVE LAMINECTOMY AND FORAMINOTOMY;  Surgeon: [name], MD;  Location: [name];  Service: Neurosurgery;  Laterality: [name];  LAMINECTOMY AND FORAMINOTOMY L2-L3, L3-L4,L4-L5  . TOTAL HIP ARTHROPLASTY Bilateral 1999,2000  . TOTAL KNEE ARTHROPLASTY Right 03/06/2017   Procedure: RIGHT TOTAL KNEE ARTHROPLASTY;  Surgeon: Gaynelle Arabian, MD;  [facility];  Service: Orthopedics;  Laterality: Right;    There were no vitals filed for this visit.  Subjective Assessment - 04/04/18 1615    Subjective  Patient reports that overall back has been "doing a whole lot better". Currently, pt states that his lower back is sore due to pulling bushes and having to run after 77 year old grandson.     Pertinent History  L hand laceration post fall from a swing in which he fell on his shoulder. Neurolopathy, vascular  deficiency B LE, LBP since march 2019, TKA on the R , B THA,     Limitations  Lifting;Sitting    Diagnostic tests  MRI: stenosis,     Patient Stated Goals  Return to activities without pain    Currently in Pain?  Yes    Pain Score  2     Pain Location  Back    Pain Orientation  Lower    Pain Descriptors / Indicators  Aching    Pain Type  Acute pain    Pain Frequency  Constant         TREATMENT Therapeutic Exercises  LTRs (assistance from PT for first 5 reps) x20 Hooklying BLE bridges x15 Lifting 20# KB from 6in step with focus on proper  squat form x10 Lifting 30# box from 6in step x5 (incr pain in L hand) Lifting 40# KB from 6in step x5 Seated pelvic tilts anterior/posterior x20 Slow seated LE marches with focus on trunk stability x10 Resisted standing hip extension with RTB 2x10 B Resisted standing hip abduction with RTB 2x10 B Hip hiking with UE support x10 B CKC standing lumbar extension x10  Prayer stretch fwd x10   Pt demonstrates increased fatigue at end of treatment session.    PT Education - 04/04/18 1620    Education Details  Patient educated on proper form and technique for therapeutic exercises.    Person(s) Educated  Patient    Methods  Explanation;Demonstration    Comprehension  Verbalized understanding;Returned demonstration          PT Long Term Goals - 03/14/18 1614      PT LONG TERM GOAL #1   Title  Patient will be independent with HEP to continue benefits of therapy until after discharge.     Baseline  Dependent with exercise performance and progression    Time  4    Period  Weeks    Status  New    Target Date  04/11/18      PT LONG TERM GOAL #2   Title  Patient will have a worst pain score of 4/10 in the past week and with activities to more comfortably be able to lift items from the floor.     Baseline  7/10 pain at worst     Time  6    Period  Weeks    Status  New    Target Date  04/25/18      PT LONG TERM GOAL #3   Title  Patient will be able to lift 50# without increase in pain to improve abililty to perform household chores.     Baseline  Unable to lift any weights from the ground without increase in pain.     Time  6    Period  Weeks    Status  New    Target Date  04/25/18      PT LONG TERM GOAL #4   Title  Pt will improve ability to walk for 10 min without increase in pain to improve ability to perform household activities and walk community distances.     Baseline  Increased pain after 2 min of walking    Time  6    Period  Weeks    Status  New    Target Date   04/25/18            Plan - 04/04/18 1621    Clinical Impression Statement  Pt demonstrates improved ability to perform bridging exercise with  no increase in back pain. Pt requires tactile cues for proper technique for anterior/posterior seated pelvic tilts and for maintiaining stable trunk during standing hip exercises. Pt demonstrates increased difficulty performing hip hiking exercise. Pt will benefit from continued skilled PT in order to return to prior level of function.    Rehab Potential  Fair    Clinical Impairments Affecting Rehab Potential  (+) highly motivated (-) preexisting conditions/previous surgeries    PT Frequency  2x / week    PT Duration  6 weeks    PT Treatment/Interventions  Manual techniques;Neuromuscular re-education;Aquatic Therapy;Cryotherapy;Electrical Stimulation;Moist Heat;Iontophoresis 4mg /ml Dexamethasone;Gait training;Stair training;Therapeutic activities;Therapeutic exercise;Balance training;Patient/family education;Passive range of motion;Dry needling    PT Next Visit Plan  progress strengthening and coordination exercises     PT Home Exercise Plan  see education section    Consulted and Agree with Plan of Care  Patient       Patient will benefit from skilled therapeutic intervention in order to improve the following deficits and impairments:  Decreased endurance, Decreased range of motion, Decreased strength, Decreased balance, Difficulty walking, Decreased coordination, Postural dysfunction, Pain  Visit Diagnosis: Muscle weakness (generalized)  Right low back pain, unspecified chronicity, with sciatica presence unspecified  Chronic pain of right knee  Unsteadiness on feet     Problem List Patient Active Problem List   Diagnosis Date Noted  . Laceration of skin of left hand 03/08/2018  . Fall 03/08/2018  . Cervical stenosis of spinal canal 06/05/2017  . Pedal edema 06/05/2017  . Health maintenance examination 12/06/2016  . Lumbar stenosis  with neurogenic claudication 07/13/2016  . Overweight (BMI 25.0-29.9) 07/04/2016  . Low vitamin B12 level 01/01/2016  . Medicare annual wellness visit, subsequent 07/03/2015  . Advanced care planning/counseling discussion 07/03/2015  . Prediabetes 05/04/2015  . Angina pectoris ([place]) 05/04/2015  . Ex-smoker 05/04/2015  . Recurrent sinusitis 08/28/2014  . 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 Spermatocele 04/02/2013  . Syncopal vertigo 11/04/2010  . Lumbar disc disease with radiculopathy 11/04/2010  . Inguinal hernia 05/26/2010  . 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 PVD (peripheral vascular disease) with claudication ([place]) 08/11/2009  . 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 Carotid stenosis 07/09/2008  . Elevated prostate specific antigen (PSA) 07/09/2008  . 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 Essential hypertension 04/30/2007  . GERD 04/30/2007  . Hyperlipidemia 04/26/2007  . OA (osteoarthritis) of knee 04/26/2007  . DVT, HX OF 04/26/2007    Georg Ruddle, SPT 04/04/2018, 4:46 PM  [facility] [address], [place], [address] Phone: [phone]   Fax:  [phone]  Name: KALEP FULL MRN: [id number] Date of Birth: 1941-03-13

## 2018-04-10 ENCOUNTER — Ambulatory Visit: Payer: PPO

## 2018-04-10 DIAGNOSIS — K210 Gastro-esophageal reflux disease with esophagitis: Secondary | ICD-10-CM | POA: Diagnosis not present

## 2018-04-11 ENCOUNTER — Ambulatory Visit: Payer: PPO

## 2018-04-11 DIAGNOSIS — M25561 Pain in right knee: Secondary | ICD-10-CM

## 2018-04-11 DIAGNOSIS — M6281 Muscle weakness (generalized): Secondary | ICD-10-CM

## 2018-04-11 DIAGNOSIS — G8929 Other chronic pain: Secondary | ICD-10-CM

## 2018-04-11 DIAGNOSIS — M545 Low back pain: Secondary | ICD-10-CM

## 2018-04-11 NOTE — Therapy (Signed)
[facility] [address], [place], [address] Phone: [phone]   Fax:  [phone]  Physical Therapy Treatment  Patient Details  Name: [name] MRN: [id number] Date of Birth: March 10, 1941 Referring Provider: Frederich Cha   Encounter Date: 04/11/2018  PT End of Session - 04/11/18 1312    Visit Number  4    Number of Visits  13    Date for PT Re-Evaluation  04/25/18    Authorization Type  4/10 Medicare    PT Start Time  1301    PT Stop Time  1345    PT Time Calculation (min)  44 min    Activity Tolerance  Patient tolerated treatment well    Behavior During Therapy  [facility] for tasks assessed/performed       Past Medical History:  Diagnosis Date  . BENIGN PROSTATIC HYPERTROPHY, WITH URINARY OBSTRUCTION 09/06/2007  . 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 Community acquired pneumonia of right lower lobe of lung ([place]) 06/05/2017  . Diabetes mellitus without complication ([place])    diet controled per dr note  . 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 DVT, HX OF 1998  . GERD 04/30/2007  . Headache    hx migraines  . History of hiatal hernia   . History of shingles   . HYPERLIPIDEMIA 04/26/2007  . HYPERTENSION 04/30/2007  . INGUINAL HERNIA, RIGHT 05/26/2010  . LOC OSTEOARTHROS NOT SPEC PRIM/SEC LOWER LEG 11/05/2009  . Lumbar disc disease with radiculopathy   . Myocardial infarction ([place]) 1989  . OSTEOARTHRITIS 04/26/2007  . 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 Pneumonia 07/2014   [facility] hospitalization  . PSA, INCREASED 07/09/2008  . Spinal stenosis of lumbar region     Past Surgical History:  Procedure Laterality Date  . APPENDECTOMY     rupture  . CARDIAC CATHETERIZATION [name] 07/07/2015   Procedure: Left Heart Cath and Cors/Grafts Angiography;  Surgeon: [name], MD;  Location: [facility];  Service: Cardiovascular;  Laterality: [name];  . CATARACT EXTRACTION, BILATERAL    . CERVICAL SPINE SURGERY  12/2016   cervical stenosis [name])  . COLONOSCOPY  03/2010   HP polyp, diverticulosis, rpt 10 yrs ([name])  . CORONARY ARTERY BYPASS GRAFT  1990   3 vessel   . CYSTOSCOPY  12/23/10   [facility]  . KNEE ARTHROSCOPY Right    x2  . LAMINOTOMY  1986   L5/S1 lumbar laminotomy for two ruptured discs/fusion  . LUMBAR LAMINECTOMY/DECOMPRESSION MICRODISCECTOMY [name] 07/13/2016   Procedure: LUMBAR TWO-THREE, LUMBAR THREE-FOUR, LUMBAR FOUR-FIVE LAMINECTOMY AND FORAMINOTOMY;  Surgeon: [name], MD;  Location: [facility];  Service: Neurosurgery;  Laterality: [name];  LAMINECTOMY AND FORAMINOTOMY L2-L3, L3-L4,L4-L5  . TOTAL HIP ARTHROPLASTY Bilateral 1999,2000  . TOTAL KNEE ARTHROPLASTY Right 03/06/2017   Procedure: RIGHT TOTAL KNEE ARTHROPLASTY;  Surgeon: Gaynelle Arabian, MD;  [facility];  Service: Orthopedics;  Laterality: Right;    There were no vitals filed for this visit.  Subjective Assessment - 04/11/18 1306    Subjective  Patient reports his hip is feeling a bit better but states his back continues to hurt. Patient states he went to the beach this weekend and states that may be contributing to his pain     Pertinent History  L hand laceration post fall from a swing in which he fell on his shoulder. Neurolopathy, vascular  deficiency B LE, LBP since march 2019, TKA on the R , B THA,     Limitations  Lifting;Sitting    Diagnostic tests  MRI: stenosis,     Patient Stated Goals  Return to activities without pain    Currently in Pain?  Yes    Pain Score  3     Pain Location  Back    Pain Orientation  Lower    Pain Descriptors / Indicators  Aching    Pain Type  Acute pain    Pain Frequency  Constant       TREATMENT Therapeutic Exercises  LTRs with physioball -- x20 Hooklying BLE bridges x15 with belt around knees  Lifting 40# KB from 6in step - x10 Seated pelvic tilts  anterior/posterior x20 Lumbar rotation in standing at OMEGA - x 20  Hip abduction at hip machine - x 20 40#  Hip extension at hip machine - x 20 85#  Downward chops - 2 x 10 with GTB  Hip hiking with UE support -- x15 B  Pt demonstrates increased fatigue at end of treatment session.   PT Education - 04/11/18 1311    Education Details  form/technique with exercise    Person(s) Educated  Patient    Methods  Explanation;Demonstration    Comprehension  Verbalized understanding;Returned demonstration          PT Long Term Goals - 03/14/18 1614      PT LONG TERM GOAL #1   Title  Patient will be independent with HEP to continue benefits of therapy until after discharge.     Baseline  Dependent with exercise performance and progression    Time  4    Period  Weeks    Status  New    Target Date  04/11/18      PT LONG TERM GOAL #2   Title  Patient will have a worst pain score of 4/10 in the past week and with activities to more comfortably be able to lift items from the floor.     Baseline  7/10 pain at worst     Time  6    Period  Weeks    Status  New    Target Date  04/25/18      PT LONG TERM GOAL #3   Title  Patient will be able to lift 50# without increase in pain to improve abililty to perform household chores.     Baseline  Unable to lift any weights from the ground without increase in pain.     Time  6    Period  Weeks    Status  New    Target Date  04/25/18      PT LONG TERM GOAL #4   Title  Pt will improve ability to walk for 10 min without increase in pain to improve ability to perform household activities and walk community distances.     Baseline  Increased pain after 2 min of walking    Time  6    Period  Weeks    Status  New    Target Date  04/25/18            Plan - 04/11/18 1338    Clinical Impression Statement  Patient demonstrates improvement with exercise performance with ability to perform greater amount of exercises in standing compared to  previous sessions indicating functional carryover between sessions. Although patient is improving, he continues to have increased LBP most notably with restisted  hip motions. Patient will benefit from further skilled therapy to return to prior level of function.      Rehab Potential  Fair    Clinical Impairments Affecting Rehab Potential  (+) highly motivated (-) preexisting conditions/previous surgeries    PT Frequency  2x / week    PT Duration  6 weeks    PT Treatment/Interventions  Manual techniques;Neuromuscular re-education;Aquatic Therapy;Cryotherapy;Electrical Stimulation;Moist Heat;Iontophoresis 4mg /ml Dexamethasone;Gait training;Stair training;Therapeutic activities;Therapeutic exercise;Balance training;Patient/family education;Passive range of motion;Dry needling    PT Next Visit Plan  progress strengthening and coordination exercises     PT Home Exercise Plan  see education section    Consulted and Agree with Plan of Care  Patient       Patient will benefit from skilled therapeutic intervention in order to improve the following deficits and impairments:  Decreased endurance, Decreased range of motion, Decreased strength, Decreased balance, Difficulty walking, Decreased coordination, Postural dysfunction, Pain  Visit Diagnosis: Muscle weakness (generalized)  Right low back pain, unspecified chronicity, with sciatica presence unspecified  Chronic pain of right knee     Problem List Patient Active Problem List   Diagnosis Date Noted  . Laceration of skin of left hand 03/08/2018  . Fall 03/08/2018  . Cervical stenosis of spinal canal 06/05/2017  . Pedal edema 06/05/2017  . Health maintenance examination 12/06/2016  . 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 Overweight (BMI 25.0-29.9) 07/04/2016  . Low vitamin B12 level 01/01/2016  . Medicare annual wellness visit, subsequent 07/03/2015  . Advanced care planning/counseling discussion 07/03/2015  .  Prediabetes 05/04/2015  . Angina pectoris ([place]) 05/04/2015  . Ex-smoker 05/04/2015  . Recurrent sinusitis 08/28/2014  . 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 Spermatocele 04/02/2013  . Syncopal vertigo 11/04/2010  . Lumbar disc disease with radiculopathy 11/04/2010  . Inguinal hernia 05/26/2010  . 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 PVD (peripheral vascular disease) with claudication ([place]) 08/11/2009  . 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 Carotid stenosis 07/09/2008  . Elevated prostate specific antigen (PSA) 07/09/2008  . 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 Essential hypertension 04/30/2007  . GERD 04/30/2007  . Hyperlipidemia 04/26/2007  . OA (osteoarthritis) of knee 04/26/2007  . DVT, HX OF 04/26/2007    Blythe Stanford, PT DPT 04/11/2018, 1:49 PM  [facility] [address], [place], [address] Phone: [phone]   Fax:  [phone]  Name: [name] MRN: [id number] Date of Birth: [birth year]

## 2018-04-12 ENCOUNTER — Other Ambulatory Visit: Payer: Self-pay | Admitting: Cardiovascular Disease

## 2018-04-17 ENCOUNTER — Ambulatory Visit: Payer: PPO | Attending: Neurosurgery

## 2018-04-17 DIAGNOSIS — M25561 Pain in right knee: Secondary | ICD-10-CM | POA: Diagnosis not present

## 2018-04-17 DIAGNOSIS — M545 Low back pain: Secondary | ICD-10-CM | POA: Diagnosis not present

## 2018-04-17 DIAGNOSIS — G8929 Other chronic pain: Secondary | ICD-10-CM | POA: Diagnosis not present

## 2018-04-17 DIAGNOSIS — M6281 Muscle weakness (generalized): Secondary | ICD-10-CM | POA: Diagnosis not present

## 2018-04-17 NOTE — Therapy (Signed)
[facility] [address], [place], [address] Phone: [phone]   Fax:  [phone]  Physical Therapy Treatment  Patient Details  Name: [name] MRN: [id number] Date of Birth: 04/30/1941 Referring Provider: Frederich Cha   Encounter Date: 04/17/2018  PT End of Session - 04/17/18 1603    Visit Number  5    Number of Visits  13    Date for PT Re-Evaluation  04/25/18    Authorization Type  5/10 Medicare    PT Start Time  1515    PT Stop Time  1600    PT Time Calculation (min)  45 min    Activity Tolerance  Patient tolerated treatment well    Behavior During Therapy  [facility] for tasks assessed/performed       Past Medical History:  Diagnosis Date  . BENIGN PROSTATIC HYPERTROPHY, WITH URINARY OBSTRUCTION 09/06/2007  . 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 Community acquired pneumonia of right lower lobe of lung ([place]) 06/05/2017  . Diabetes mellitus without complication ([place])    diet controled per dr note  . 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 DVT, HX OF 1998  . GERD 04/30/2007  . Headache    hx migraines  . History of hiatal hernia   . History of shingles   . HYPERLIPIDEMIA 04/26/2007  . HYPERTENSION 04/30/2007  . INGUINAL HERNIA, RIGHT 05/26/2010  . LOC OSTEOARTHROS NOT SPEC PRIM/SEC LOWER LEG 11/05/2009  . Lumbar disc disease with radiculopathy   . Myocardial infarction ([place]) 1989  . OSTEOARTHRITIS 04/26/2007  . 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 Pneumonia 07/2014   [facility] hospitalization  . PSA, INCREASED 07/09/2008  . Spinal stenosis of lumbar region     Past Surgical History:  Procedure Laterality Date  . APPENDECTOMY     rupture  . CARDIAC CATHETERIZATION [name] 07/07/2015   Procedure: Left Heart Cath and Cors/Grafts Angiography;  Surgeon: [name], MD;  Location: [facility];  Service: Cardiovascular;  Laterality: [name];  . CATARACT EXTRACTION, BILATERAL    . CERVICAL SPINE SURGERY  12/2016   cervical stenosis [name])  . COLONOSCOPY  03/2010   HP polyp, diverticulosis, rpt 10 yrs ([name])  . CORONARY ARTERY BYPASS GRAFT  1990   3 vessel   . CYSTOSCOPY  12/23/10   Cope  . KNEE ARTHROSCOPY Right    x2  . LAMINOTOMY  1986   L5/S1 lumbar laminotomy for two ruptured discs/fusion  . LUMBAR LAMINECTOMY/DECOMPRESSION MICRODISCECTOMY [name] 07/13/2016   Procedure: LUMBAR TWO-THREE, LUMBAR THREE-FOUR, LUMBAR FOUR-FIVE LAMINECTOMY AND FORAMINOTOMY;  Surgeon: [name], MD;  Location: [facility];  Service: Neurosurgery;  Laterality: [name];  LAMINECTOMY AND FORAMINOTOMY L2-L3, L3-L4,L4-L5  . TOTAL HIP ARTHROPLASTY Bilateral 1999,2000  . TOTAL KNEE ARTHROPLASTY Right 03/06/2017   Procedure: RIGHT TOTAL KNEE ARTHROPLASTY;  Surgeon: Gaynelle Arabian, MD;  [facility];  Service: Orthopedics;  Laterality: Right;    There were no vitals filed for this visit.  Subjective Assessment - 04/17/18 1555    Subjective  Patient reports his hip and back is overall feeling better but continues to have increased hip and back pain at night and walking on concrete.     Pertinent History  L hand laceration post fall from a swing in which he fell on his shoulder. Neurolopathy, vascular deficiency B LE, LBP since march 2019, TKA on  the R , B THA,     Limitations  Lifting;Sitting    Diagnostic tests  MRI: stenosis,     Patient Stated Goals  Return to activities without pain    Currently in Pain?  Yes    Pain Score  3     Pain Location  Back    Pain Orientation  Lower    Pain Descriptors / Indicators  Aching    Pain Type  Acute pain    Pain Frequency  Constant       TREATMENT Therapeutic Exercises  LTRs with physioball -- x20 Physioball Bridges with legs straight - x 20  Single leg bridges - 2 x 5 Hip abduction at hip machine - x 20 40#  Hip extension at hip machine -  x 20 85#  Hip hiking with UE support -- x15 B Single leg stance on airex pad - x 30sec x 3 with ball toss B Step ups onto bosu ball - x 20 B  Lumbar rotation in standing at OMEGA - x 20 5#    Pt demonstrates increased fatigue at end of treatment session.   PT Education - 04/17/18 1600    Education Details  form/technique with exercise    Person(s) Educated  Patient    Methods  Explanation;Demonstration    Comprehension  Verbalized understanding;Returned demonstration          PT Long Term Goals - 03/14/18 1614      PT LONG TERM GOAL #1   Title  Patient will be independent with HEP to continue benefits of therapy until after discharge.     Baseline  Dependent with exercise performance and progression    Time  4    Period  Weeks    Status  New    Target Date  04/11/18      PT LONG TERM GOAL #2   Title  Patient will have a worst pain score of 4/10 in the past week and with activities to more comfortably be able to lift items from the floor.     Baseline  7/10 pain at worst     Time  6    Period  Weeks    Status  New    Target Date  04/25/18      PT LONG TERM GOAL #3   Title  Patient will be able to lift 50# without increase in pain to improve abililty to perform household chores.     Baseline  Unable to lift any weights from the ground without increase in pain.     Time  6    Period  Weeks    Status  New    Target Date  04/25/18      PT LONG TERM GOAL #4   Title  Pt will improve ability to walk for 10 min without increase in pain to improve ability to perform household activities and walk community distances.     Baseline  Increased pain after 2 min of walking    Time  6    Period  Weeks    Status  New    Target Date  04/25/18            Plan - 04/17/18 1709    Clinical Impression Statement  Patient demonstrates improvement with exercise with greater ability to perform greater amount of repetitions at greater resistance compared to previous sessions.  Although patient is improving, he continues to demonstrate increased pain and spasms with performing rotational  lumbar exercises. Patient will benefit from further skilled therapy to return to prior level of function.     Rehab Potential  Fair    Clinical Impairments Affecting Rehab Potential  (+) highly motivated (-) preexisting conditions/previous surgeries    PT Frequency  2x / week    PT Duration  6 weeks    PT Treatment/Interventions  Manual techniques;Neuromuscular re-education;Aquatic Therapy;Cryotherapy;Electrical Stimulation;Moist Heat;Iontophoresis 4mg /ml Dexamethasone;Gait training;Stair training;Therapeutic activities;Therapeutic exercise;Balance training;Patient/family education;Passive range of motion;Dry needling    PT Next Visit Plan  progress strengthening and coordination exercises     PT Home Exercise Plan  see education section    Consulted and Agree with Plan of Care  Patient       Patient will benefit from skilled therapeutic intervention in order to improve the following deficits and impairments:  Decreased endurance, Decreased range of motion, Decreased strength, Decreased balance, Difficulty walking, Decreased coordination, Postural dysfunction, Pain  Visit Diagnosis: Muscle weakness (generalized)  Right low back pain, unspecified chronicity, with sciatica presence unspecified  Chronic pain of right knee     Problem List Patient Active Problem List   Diagnosis Date Noted  . Laceration of skin of left hand 03/08/2018  . Fall 03/08/2018  . Cervical stenosis of spinal canal 06/05/2017  . Pedal edema 06/05/2017  . Health maintenance examination 12/06/2016  . 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 Overweight (BMI 25.0-29.9) 07/04/2016  . Low vitamin B12 level 01/01/2016  . Medicare annual wellness visit, subsequent 07/03/2015  . Advanced care planning/counseling discussion 07/03/2015  . Prediabetes 05/04/2015  . Angina pectoris ([place])  05/04/2015  . Ex-smoker 05/04/2015  . Recurrent sinusitis 08/28/2014  . 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 Spermatocele 04/02/2013  . Syncopal vertigo 11/04/2010  . Lumbar disc disease with radiculopathy 11/04/2010  . Inguinal hernia 05/26/2010  . 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 PVD (peripheral vascular disease) with claudication ([place]) 08/11/2009  . 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 Carotid stenosis 07/09/2008  . Elevated prostate specific antigen (PSA) 07/09/2008  . 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 Essential hypertension 04/30/2007  . GERD 04/30/2007  . Hyperlipidemia 04/26/2007  . OA (osteoarthritis) of knee 04/26/2007  . DVT, HX OF 04/26/2007    Blythe Stanford, PT DPT 04/17/2018, 5:23 PM  [facility] [address] [place], [address] Phone: [phone]   Fax:  [phone]  Name: KALIM KISSEL MRN: [id number] Date of Birth: [birth year]

## 2018-04-18 DIAGNOSIS — R3915 Urgency of urination: Secondary | ICD-10-CM | POA: Diagnosis not present

## 2018-04-18 DIAGNOSIS — N401 Enlarged prostate with lower urinary tract symptoms: Secondary | ICD-10-CM | POA: Diagnosis not present

## 2018-04-19 ENCOUNTER — Ambulatory Visit: Payer: PPO

## 2018-04-19 DIAGNOSIS — L853 Xerosis cutis: Secondary | ICD-10-CM | POA: Diagnosis not present

## 2018-04-19 DIAGNOSIS — T07XXXA Unspecified multiple injuries, initial encounter: Secondary | ICD-10-CM | POA: Diagnosis not present

## 2018-04-19 DIAGNOSIS — L820 Inflamed seborrheic keratosis: Secondary | ICD-10-CM | POA: Diagnosis not present

## 2018-04-19 DIAGNOSIS — L219 Seborrheic dermatitis, unspecified: Secondary | ICD-10-CM | POA: Diagnosis not present

## 2018-04-19 DIAGNOSIS — L570 Actinic keratosis: Secondary | ICD-10-CM | POA: Diagnosis not present

## 2018-04-23 ENCOUNTER — Ambulatory Visit: Payer: PPO

## 2018-04-23 DIAGNOSIS — M6281 Muscle weakness (generalized): Secondary | ICD-10-CM | POA: Diagnosis not present

## 2018-04-23 DIAGNOSIS — M545 Low back pain: Secondary | ICD-10-CM

## 2018-04-23 NOTE — Therapy (Signed)
[facility] [address] [place], [address] Phone: [phone]   Fax:  [phone]  Physical Therapy Treatment  Patient Details  Name: [name] MRN: [id number] Date of Birth: 09-Aug-1941 Referring Provider: Frederich Cha   Encounter Date: 04/23/2018  PT End of Session - 04/23/18 1411    Visit Number  6    Number of Visits  13    Date for PT Re-Evaluation  04/25/18    Authorization Type  6/10 Medicare    PT Start Time  1345    PT Stop Time  1430    PT Time Calculation (min)  45 min    Activity Tolerance  Patient tolerated treatment well    Behavior During Therapy  [facility] for tasks assessed/performed       Past Medical History:  Diagnosis Date  . BENIGN PROSTATIC HYPERTROPHY, WITH URINARY OBSTRUCTION 09/06/2007  . 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 Community acquired pneumonia of right lower lobe of lung ([place]) 06/05/2017  . Diabetes mellitus without complication ([place])    diet controled per dr note  . 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 DVT, HX OF 1998  . GERD 04/30/2007  . Headache    hx migraines  . History of hiatal hernia   . History of shingles   . HYPERLIPIDEMIA 04/26/2007  . HYPERTENSION 04/30/2007  . INGUINAL HERNIA, RIGHT 05/26/2010  . LOC OSTEOARTHROS NOT SPEC PRIM/SEC LOWER LEG 11/05/2009  . Lumbar disc disease with radiculopathy   . Myocardial infarction ([place]) 1989  . OSTEOARTHRITIS 04/26/2007  . 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 Pneumonia 07/2014   [facility] hospitalization  . PSA, INCREASED 07/09/2008  . Spinal stenosis of lumbar region     Past Surgical History:  Procedure Laterality Date  . APPENDECTOMY     rupture  . CARDIAC CATHETERIZATION [name] 07/07/2015   Procedure: Left Heart Cath and Cors/Grafts Angiography;  Surgeon: [name], MD;  Location: [facility];  Service: Cardiovascular;  Laterality: [name];  . CATARACT EXTRACTION, BILATERAL    . CERVICAL SPINE SURGERY  12/2016   cervical stenosis [name])  . COLONOSCOPY  03/2010   HP polyp, diverticulosis, rpt 10 yrs ([name])  . CORONARY ARTERY BYPASS GRAFT  1990   3 vessel   . CYSTOSCOPY  12/23/10   [facility]  . KNEE ARTHROSCOPY Right    x2  . LAMINOTOMY  1986   L5/S1 lumbar laminotomy for two ruptured discs/fusion  . LUMBAR LAMINECTOMY/DECOMPRESSION MICRODISCECTOMY [name] 07/13/2016   Procedure: LUMBAR TWO-THREE, LUMBAR THREE-FOUR, LUMBAR FOUR-FIVE LAMINECTOMY AND FORAMINOTOMY;  Surgeon: [name], MD;  Location: [facility];  Service: Neurosurgery;  Laterality: [name];  LAMINECTOMY AND FORAMINOTOMY L2-L3, L3-L4,L4-L5  . TOTAL HIP ARTHROPLASTY Bilateral 1999,2000  . TOTAL KNEE ARTHROPLASTY Right 03/06/2017   Procedure: RIGHT TOTAL KNEE ARTHROPLASTY;  Surgeon: Gaynelle Arabian, MD;  Location: [facility];  Service: Orthopedics;  Laterality: Right;    There were no vitals filed for this visit.  Subjective Assessment - 04/23/18 1357    Subjective  Patient reports his back and hip are feeling a lot better overall.     Pertinent History  L hand laceration post fall from a swing in which he fell on his shoulder. Neurolopathy, vascular deficiency B LE, LBP since march 2019, TKA on the R , B THA,     Limitations  Lifting;Sitting  Diagnostic tests  MRI: stenosis,     Patient Stated Goals  Return to activities without pain    Currently in Pain?  Yes    Pain Score  2     Pain Location  Back    Pain Orientation  Lower    Pain Descriptors / Indicators  Aching    Pain Type  Acute pain    Pain Frequency  Constant       TREATMENT Therapeutic Exercises  LTRs with physioball -- x20 Physioball Bridges with legs straight - x 20  Knees to chest - x20 with physioball Hip abduction at hip machine - 2 x 20 55#  Hip extension at hip machine - 2 x 20 100#  Hip sliders circles with the LEs - x20  Running  man with slider - x 15 on the L; x5 on the R (stopped secondary to knee pain) Side stepping up/down bosu ball - x 20 Hip hiking with UE support -- x15 B     Pt demonstrates increased fatigue at end of treatment session.    PT Education - 04/23/18 1410    Education Details  form/technique with exercise    Person(s) Educated  Patient    Methods  Explanation;Demonstration    Comprehension  Verbalized understanding;Returned demonstration          PT Long Term Goals - 03/14/18 1614      PT LONG TERM GOAL #1   Title  Patient will be independent with HEP to continue benefits of therapy until after discharge.     Baseline  Dependent with exercise performance and progression    Time  4    Period  Weeks    Status  New    Target Date  04/11/18      PT LONG TERM GOAL #2   Title  Patient will have a worst pain score of 4/10 in the past week and with activities to more comfortably be able to lift items from the floor.     Baseline  7/10 pain at worst     Time  6    Period  Weeks    Status  New    Target Date  04/25/18      PT LONG TERM GOAL #3   Title  Patient will be able to lift 50# without increase in pain to improve abililty to perform household chores.     Baseline  Unable to lift any weights from the ground without increase in pain.     Time  6    Period  Weeks    Status  New    Target Date  04/25/18      PT LONG TERM GOAL #4   Title  Pt will improve ability to walk for 10 min without increase in pain to improve ability to perform household activities and walk community distances.     Baseline  Increased pain after 2 min of walking    Time  6    Period  Weeks    Status  New    Target Date  04/25/18            Plan - 04/23/18 1422    Clinical Impression Statement  Patient demonstrates improvement with exercise with ability to perform exercises against greater resistance and with overall less pain compared to previous sessions. Patient conitnues to demonstrate  decreased hip strength with extension movements. Patient will benefit from further skilled therapy to return to prior level of function.  Rehab Potential  Fair    Clinical Impairments Affecting Rehab Potential  (+) highly motivated (-) preexisting conditions/previous surgeries    PT Frequency  2x / week    PT Duration  6 weeks    PT Treatment/Interventions  Manual techniques;Neuromuscular re-education;Aquatic Therapy;Cryotherapy;Electrical Stimulation;Moist Heat;Iontophoresis 4mg /ml Dexamethasone;Gait training;Stair training;Therapeutic activities;Therapeutic exercise;Balance training;Patient/family education;Passive range of motion;Dry needling    PT Next Visit Plan  progress strengthening and coordination exercises     PT Home Exercise Plan  see education section    Consulted and Agree with Plan of Care  Patient       Patient will benefit from skilled therapeutic intervention in order to improve the following deficits and impairments:  Decreased endurance, Decreased range of motion, Decreased strength, Decreased balance, Difficulty walking, Decreased coordination, Postural dysfunction, Pain  Visit Diagnosis: Muscle weakness (generalized)  Right low back pain, unspecified chronicity, with sciatica presence unspecified     Problem List Patient Active Problem List   Diagnosis Date Noted  . Laceration of skin of left hand 03/08/2018  . Fall 03/08/2018  . Cervical stenosis of spinal canal 06/05/2017  . Pedal edema 06/05/2017  . Health maintenance examination 12/06/2016  . 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 Overweight (BMI 25.0-29.9) 07/04/2016  . Low vitamin B12 level 01/01/2016  . Medicare annual wellness visit, subsequent 07/03/2015  . Advanced care planning/counseling discussion 07/03/2015  . Prediabetes 05/04/2015  . Angina pectoris ([place]) 05/04/2015  . Ex-smoker 05/04/2015  . Recurrent sinusitis 08/28/2014  . 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 Spermatocele 04/02/2013  . Syncopal vertigo 11/04/2010  . Lumbar disc disease with radiculopathy 11/04/2010  . Inguinal hernia 05/26/2010  . 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 PVD (peripheral vascular disease) with claudication ([place]) 08/11/2009  . 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 Carotid stenosis 07/09/2008  . Elevated prostate specific antigen (PSA) 07/09/2008  . 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 Essential hypertension 04/30/2007  . GERD 04/30/2007  . Hyperlipidemia 04/26/2007  . OA (osteoarthritis) of knee 04/26/2007  . DVT, HX OF 04/26/2007    Blythe Stanford, PT DPT 04/23/2018, 2:33 PM  [facility] [address], [place], [address] Phone: [phone]   Fax:  [phone]  Name: KELL FERRIS MRN: [id number] Date of Birth: 1940/09/10

## 2018-04-25 ENCOUNTER — Ambulatory Visit: Payer: PPO

## 2018-04-25 DIAGNOSIS — G8929 Other chronic pain: Secondary | ICD-10-CM

## 2018-04-25 DIAGNOSIS — M25561 Pain in right knee: Secondary | ICD-10-CM

## 2018-04-25 DIAGNOSIS — M6281 Muscle weakness (generalized): Secondary | ICD-10-CM

## 2018-04-25 DIAGNOSIS — M545 Low back pain: Secondary | ICD-10-CM

## 2018-04-25 NOTE — Therapy (Signed)
[facility] [address] [place], [address] Phone: [phone]   Fax:  [phone]  Physical Therapy Treatment/ Discharge Summary  Patient Details  Name: [name] MRN: [id number] Date of Birth: Jan 04, 1941 Referring Provider: Frederich Cha  Patient has attended 7 out of 7 goals and is to be discharged from physical therapy.   Encounter Date: 04/25/2018  PT End of Session - 04/25/18 1414    Visit Number  7    Number of Visits  13    Date for PT Re-Evaluation  04/25/18    Authorization Type  7/10 Medicare    PT Start Time  1345    PT Stop Time  1430    PT Time Calculation (min)  45 min    Activity Tolerance  Patient tolerated treatment well    Behavior During Therapy  [facility] for tasks assessed/performed       Past Medical History:  Diagnosis Date  . BENIGN PROSTATIC HYPERTROPHY, WITH URINARY OBSTRUCTION 09/06/2007  . 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 Community acquired pneumonia of right lower lobe of lung ([place]) 06/05/2017  . Diabetes mellitus without complication ([place])    diet controled per dr note  . 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 DVT, HX OF 1998  . GERD 04/30/2007  . Headache    hx migraines  . History of hiatal hernia   . History of shingles   . HYPERLIPIDEMIA 04/26/2007  . HYPERTENSION 04/30/2007  . INGUINAL HERNIA, RIGHT 05/26/2010  . LOC OSTEOARTHROS NOT SPEC PRIM/SEC LOWER LEG 11/05/2009  . Lumbar disc disease with radiculopathy   . Myocardial infarction ([place]) 1989  . OSTEOARTHRITIS 04/26/2007  . 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 Pneumonia 07/2014   [facility] hospitalization  . PSA, INCREASED 07/09/2008  . Spinal stenosis of lumbar region     Past Surgical History:  Procedure Laterality Date  . APPENDECTOMY     rupture  . CARDIAC CATHETERIZATION [name] 07/07/2015    Procedure: Left Heart Cath and Cors/Grafts Angiography;  Surgeon: [name], MD;  Location: [facility];  Service: Cardiovascular;  Laterality: [name];  . CATARACT EXTRACTION, BILATERAL    . CERVICAL SPINE SURGERY  12/2016   cervical stenosis [name])  . COLONOSCOPY  03/2010   HP polyp, diverticulosis, rpt 10 yrs ([name])  . CORONARY ARTERY BYPASS GRAFT  1990   3 vessel   . CYSTOSCOPY  12/23/10   [facility]  . KNEE ARTHROSCOPY Right    x2  . LAMINOTOMY  1986   L5/S1 lumbar laminotomy for two ruptured discs/fusion  . LUMBAR LAMINECTOMY/DECOMPRESSION MICRODISCECTOMY [name] 07/13/2016   Procedure: LUMBAR TWO-THREE, LUMBAR THREE-FOUR, LUMBAR FOUR-FIVE LAMINECTOMY AND FORAMINOTOMY;  Surgeon: [name], MD;  Location: [facility];  Service: Neurosurgery;  Laterality: [name];  LAMINECTOMY AND FORAMINOTOMY L2-L3, L3-L4,L4-L5  . TOTAL HIP ARTHROPLASTY Bilateral 1999,2000  . TOTAL KNEE ARTHROPLASTY Right 03/06/2017   Procedure: RIGHT TOTAL KNEE ARTHROPLASTY;  Surgeon: Gaynelle Arabian, MD;  [facility];  Service: Orthopedics;  Laterality: Right;    There were no vitals filed for this visit.  Subjective Assessment - 04/25/18 1404    Subjective  Patient reports his back and hip continue to feel better and states he would like this visit to be his last.     Pertinent History  L hand laceration post fall from a swing in which he  fell on his shoulder. Neurolopathy, vascular deficiency B LE, LBP since march 2019, TKA on the R , B THA,     Limitations  Lifting;Sitting    Diagnostic tests  MRI: stenosis,     Patient Stated Goals  Return to activities without pain    Currently in Pain?  No/denies       TREATMENT Therapeutic Exercises  LTRs with physioball -- 3x20 Physioball Bridges with legs straight - 3 x 20  Knees to chest - 3x20 with physioball Hip abduction at hip machine - 2 x 20 55#  Hip extension at hip machine - 2 x 20 100#  Squats with UE support - 2 x 20  Side stepping up and over 6"  step - x 20  Forward step ups up and down 6" step - x 20 B  Hip hiking with UE support - x 20 B   Pt demonstrates increased fatigue at end of treatment session.  PT Education - 04/25/18 1413    Education Details  form/technique with exercise    Person(s) Educated  Patient    Methods  Explanation;Demonstration    Comprehension  Verbalized understanding;Returned demonstration          PT Long Term Goals - 04/25/18 1425      PT LONG TERM GOAL #1   Title  Patient will be independent with HEP to continue benefits of therapy until after discharge.     Baseline  Dependent with exercise performance and progression; 04/25/2018: independent with exercise    Time  4    Period  Weeks    Status  Achieved      PT LONG TERM GOAL #2   Title  Patient will have a worst pain score of 4/10 in the past week and with activities to more comfortably be able to lift items from the floor.     Baseline  7/10 pain at worst; 04/25/2018: 3/10 worst pain    Time  6    Period  Weeks    Status  Achieved      PT LONG TERM GOAL #3   Title  Patient will be able to lift 50# without increase in pain to improve abililty to perform household chores.     Baseline  Unable to lift any weights from the ground without increase in pain; 04/25/2018: able to lift weight without increase in pain    Time  6    Period  Weeks    Status  Achieved      PT LONG TERM GOAL #4   Title  Pt will improve ability to walk for 10 min without increase in pain to improve ability to perform household activities and walk community distances.     Baseline  Increased pain after 2 min of walking; 04/25/2018: No increase in pain after >10min of walking    Time  6    Period  Weeks    Status  Achieved            Plan - 04/25/18 1427    Clinical Impression Statement  Patient has met all long term goals with having a worst pain score of 3/10, able to lift weights without incresae in pain and demonstrates overall improvement with ambulation  and performance of household chores. Patient demonstrates overall improvement with exercises and is independent with exercise performance. Focused todays visit on added exercises to HEP. Patient will benefit from further skilled therapy to return to prior level of function.       Rehab Potential  Fair    Clinical Impairments Affecting Rehab Potential  (+) highly motivated (-) preexisting conditions/previous surgeries    PT Frequency  2x / week    PT Duration  6 weeks    PT Treatment/Interventions  Manual techniques;Neuromuscular re-education;Aquatic Therapy;Cryotherapy;Electrical Stimulation;Moist Heat;Iontophoresis 4mg/ml Dexamethasone;Gait training;Stair training;Therapeutic activities;Therapeutic exercise;Balance training;Patient/family education;Passive range of motion;Dry needling    PT Next Visit Plan  progress strengthening and coordination exercises     PT Home Exercise Plan  see education section    Consulted and Agree with Plan of Care  Patient       Patient will benefit from skilled therapeutic intervention in order to improve the following deficits and impairments:  Decreased endurance, Decreased range of motion, Decreased strength, Decreased balance, Difficulty walking, Decreased coordination, Postural dysfunction, Pain  Visit Diagnosis: Muscle weakness (generalized)  Right low back pain, unspecified chronicity, with sciatica presence unspecified  Chronic pain of right knee     Problem List Patient Active Problem List   Diagnosis Date Noted  . Laceration of skin of left hand 03/08/2018  . Fall 03/08/2018  . Cervical stenosis of spinal canal 06/05/2017  . Pedal edema 06/05/2017  . Health maintenance examination 12/06/2016  . 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 Overweight (BMI 25.0-29.9) 07/04/2016  . Low vitamin B12 level 01/01/2016  . Medicare annual wellness visit, subsequent 07/03/2015  . Advanced care planning/counseling discussion 07/03/2015   . Prediabetes 05/04/2015  . Angina pectoris ([facility]) 05/04/2015  . Ex-smoker 05/04/2015  . Recurrent sinusitis 08/28/2014  . 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 Spermatocele 04/02/2013  . Syncopal vertigo 11/04/2010  . Lumbar disc disease with radiculopathy 11/04/2010  . Inguinal hernia 05/26/2010  . 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 PVD (peripheral vascular disease) with claudication ([facility]) 08/11/2009  . 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 Carotid stenosis 07/09/2008  . Elevated prostate specific antigen (PSA) 07/09/2008  . 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 Essential hypertension 04/30/2007  . GERD 04/30/2007  . Hyperlipidemia 04/26/2007  . OA (osteoarthritis) of knee 04/26/2007  . DVT, HX OF 04/26/2007    Copeland Rissell, PT DPT 04/25/2018, 2:29 PM  [facility] [address] [place], , [address] Phone: [phone]   Fax:  [phone]  Name: Iban L Melgarejo MRN: [id number] Date of Birth: 05/03/1941   

## 2018-04-30 ENCOUNTER — Ambulatory Visit: Payer: PPO

## 2018-04-30 NOTE — Progress Notes (Addendum)
Cardiology Office Note  Date:  05/02/2018   ID:  [name], DOB 10/29/1940, MRN [id number]  PCP:  Ria Bush, MD   Chief Complaint  Patient presents with  . other    12 mo follow up. Medications verbally reviewed.     HPI:  77 year old gentleman with hx of OSA, did not tolerate CPAP mild  bilateral carotid arterial disease  <39% bilaterally 08/2016 hyperlipidemia  coronary artery disease,  bypass in 1990,  stenting to the circumflex in 2002,  Last cath 06/2015, patent grafts x 2, RCA small vessel diffuse disease DVT in the left lower extremity,  Completed back surgery for spinal stenosis, 06/20/2016 Cervical neck surgery 01/2017, [name]   total knee surgery in 02/2017 who presents for routine followup Of his coronary artery disease.   Knee pain on the right Chronic back pain and other arthritides Typically only takes Tylenol, does not like to take pain medication no longer using his cane  Denies having any significant chest pain on exertion  Blood pressure elevated today which she attributes to knee pain leg pain Long discussion concerning his leg pain as he does have bilateral SFA disease Periodic follow-up with Dr. [name] Patient reports it is difficult to determine what his pain from arthritis and what is pain from vascular disease  Lab work reviewed with him in detail HBA1C 6.4 Lab Results  Component Value Date   CHOL 127 11/30/2017   HDL 37.50 (L) 11/30/2017   LDLCALC 73 11/30/2017   TRIG 84.0 11/30/2017   EKG personally reviewed by myself on todays visit shows normal sinus rhythm with rate 54 bpm, left axis deviation, no significant ST or T wave change  Other past medical history AAA less than 3 cm, on scan in 2018 reviewed with him Numerous previous episode of chest pain relieved with belching consistent with GI etiology.   Previous cardiac catheterization 07/07/2015  Ost LM to LM lesion, 50% stenosed.  Ost  RCA lesion, 80% stenosed.  Prox RCA-1 lesion, 80% stenosed.  Prox RCA-2 lesion, 80% stenosed.  LIMA was injected is moderate in size, and is anatomically normal.  Dist LAD lesion, 60% stenosed.  Ost LAD to Prox LAD lesion, 100% stenosed.  Mid LAD lesion, 60% stenosed.  The left ventricular systolic function is normal.  SVG was injected is normal in caliber, and is anatomically normal.  2nd Diag lesion, 50% stenosed.  severe RCA disease (small vessel) not amenable to PCI, moderate left main disease, moderate LAD and proximal diagonal disease. --Patent grafts x2 --No intervention performed, started on isosorbide 30 mg daily  went to the emergency room in [facility] 07/03/2013 with chest pain. Had a CT scan of his chest which was essentially benign. Nitroglycerin did not help his pain. He ruled out and was sent home.  Repeat episode of chest pain 08/05/2013 with stress test at that time showing no significant ischemia, ejection fraction 48%. It was felt his chest pain was atypical in nature. She feels it was from GI etiology given his negative stress test.  History of cold induced chest tightness/shortness of breath, asthma.   episode of chest pain 05/14/2013. He felt was indigestion. He took several nitroglycerin with no significant improvement in his symptoms. Symptoms were severe, felt similar to his previous MI. He got in his car and on the way, had a big burp. Symptoms seemed to resolve at that time. He continued on to the emergency room. There cardiac enzymes were negative x2, EKG unchanged. He reports  having had prior chest pain episode relieved with belching in the past.   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  PSH:    Past Surgical History:  Procedure Laterality Date  . APPENDECTOMY     rupture  . CARDIAC CATHETERIZATION [name] 07/07/2015   Procedure: Left Heart Cath and Cors/Grafts Angiography;  Surgeon: [name], MD;  Location: [place] [facility];  Service: Cardiovascular;  Laterality: [name];  . CATARACT EXTRACTION, BILATERAL    . CERVICAL SPINE SURGERY  12/2016   cervical stenosis [name])  . COLONOSCOPY  03/2010   HP polyp, diverticulosis, rpt 10 yrs ([name])  . CORONARY ARTERY BYPASS GRAFT  1990   3 vessel   . CYSTOSCOPY  12/23/10   [facility]  . KNEE ARTHROSCOPY Right    x2  . LAMINOTOMY  1986   L5/S1 lumbar laminotomy for two ruptured discs/fusion  . LUMBAR LAMINECTOMY/DECOMPRESSION MICRODISCECTOMY [name] 07/13/2016   Procedure: LUMBAR TWO-THREE, LUMBAR THREE-FOUR, LUMBAR FOUR-FIVE LAMINECTOMY AND FORAMINOTOMY;  Surgeon: [name], MD;  Location: [place];  Service: Neurosurgery;  Laterality: [name];  LAMINECTOMY AND FORAMINOTOMY L2-L3, L3-L4,L4-L5  . TOTAL HIP ARTHROPLASTY Bilateral 1999,2000  . TOTAL KNEE ARTHROPLASTY Right 03/06/2017   Procedure: RIGHT TOTAL KNEE ARTHROPLASTY;  Surgeon: Gaynelle Arabian, MD;  Location: [facility];  Service: Orthopedics;  Laterality: Right;    Current Outpatient Medications  Medication Sig Dispense  Refill  . acetaminophen (TYLENOL) 500 MG tablet Take 2 tablets (1,000 mg total) by mouth as needed for moderate pain.    Marland Kitchen albuterol (PROVENTIL HFA;VENTOLIN HFA) 108 (90 Base) MCG/ACT inhaler Inhale 2 puffs into the lungs every 6 (six) hours as needed for wheezing or shortness of breath.    Marland Kitchen aspirin EC 81 MG tablet Take 1 tablet (81 mg total) by mouth daily. 90 tablet 3  . docusate sodium (COLACE) 100 MG capsule Take 1 capsule (100 mg total) by mouth 2 (two) times daily. 60 capsule 0  . ezetimibe (ZETIA) 10 MG tablet TAKE ONE TABLET BY MOUTH EVERY DAY 90 tablet 0  . gabapentin (NEURONTIN) 300 MG capsule Take 300 mg by mouth 3 (three) times daily.    Marland Kitchen HYDROcodone-acetaminophen (NORCO/VICODIN) 5-325 MG tablet     . isosorbide mononitrate (IMDUR) 30 MG 24 hr tablet TAKE ONE TABLET EVERY DAY 90 tablet 1  . nitroGLYCERIN (NITROSTAT) 0.4 MG SL tablet Place 1 tablet (0.4 mg total) under the tongue every 5 (five) minutes as needed for chest pain. 25 tablet 6  . simvastatin (ZOCOR) 40 MG tablet TAKE ONE TABLET BY MOUTH EVERY DAY 90 tablet 0  . tolterodine (DETROL LA) 4 MG 24 hr capsule Take 4 mg by mouth daily.    Marland Kitchen ketoconazole (NIZORAL) 2 % cream      No current facility-administered medications for this visit.      Allergies:   Morphine and Vioxx [rofecoxib]   Social History:  The  patient  reports that he quit smoking about 29 years ago. His smoking use included cigarettes. He has a 25.00 pack-year smoking history. He has never used smokeless tobacco. He reports that he does not drink alcohol or use drugs.   Family History:   family history includes Cancer in his other and sister; Diabetes in his brother and mother; Heart attack in his mother; Heart disease in his father; Kidney disease in his father and sister; Stroke in his father and mother.    Review of Systems: Review of Systems  Constitutional: Negative.   Respiratory: Negative.   Cardiovascular: Negative.   Gastrointestinal:  Negative.   Musculoskeletal: Positive for back pain and joint pain.       Right knee discomfort  Neurological: Negative.   Psychiatric/Behavioral: Negative.   All other systems reviewed and are negative.    PHYSICAL EXAM: VS:  BP (!) 160/66 (BP Location: Left Arm, Patient Position: Sitting, Cuff Size: Normal)   Pulse (!) 54   Ht 6\' 1"  (1.854 m)   Wt 212 lb (96.2 kg)   BMI 27.97 kg/m  , BMI Body mass index is 27.97 kg/m.  Constitutional:  oriented to person, place, and time. No distress.  HENT:  Head: Normocephalic and atraumatic.  Eyes:  no discharge. No scleral icterus.  Neck: Normal range of motion. Neck supple. No JVD present.  Cardiovascular: Normal rate, regular rhythm, normal heart sounds and intact distal pulses. Exam reveals no gallop and no friction rub. No edema No murmur heard. Pulmonary/Chest: Effort normal and breath sounds normal. No stridor. No respiratory distress.  no wheezes.  no rales.  no tenderness.  Abdominal: Soft.  no distension.  no tenderness.  Musculoskeletal: Normal range of motion.  no  tenderness or deformity.  Neurological:  normal muscle tone. Coordination normal. No atrophy Skin: Skin is warm and dry. No rash noted. not diaphoretic.  Psychiatric:  normal mood and affect. behavior is normal. Thought content normal.   Recent Labs: 06/05/2017: TSH 4.06 11/30/2017: ALT 13; BUN 22; Creatinine, Ser 1.09; Hemoglobin 14.7; Platelets 191.0; Potassium 4.1; Sodium 139    Lipid Panel Lab Results  Component Value Date   CHOL 127 11/30/2017   HDL 37.50 (L) 11/30/2017   LDLCALC 73 11/30/2017   TRIG 84.0 11/30/2017      Wt Readings from Last 3 Encounters:  05/02/18 212 lb (96.2 kg)  03/26/18 207 lb (93.9 kg)  03/15/18 209 lb 4 oz (94.9 kg)       ASSESSMENT AND PLAN:  Mixed hyperlipidemia Cholesterol is at goal on the current lipid regimen. No changes to the medications were made.  Essential hypertension Blood pressure elevated on today's  visit He will check numbers suggested we can call our office Prior office visits reviewed with other providers and typically 378 systolic He is in pain today, knees legs  Atherosclerosis of coronary artery bypass graft of native heart with stable angina pectoris ([place]) Currently with no symptoms of angina. No further workup at this time. Continue current medication regimen. Stable  Bilateral carotid artery stenosis Mild bilateral carotid disease less than 39% bilaterally Stressed importance of aggressive diabetes and cholesterol control  Angina pectoris ([facility]) No sx, no further testing Active but limited by his leg pain  Diet-controlled diabetes mellitus ([place]) Numbers are stable  Ex-smoker Nonsmoker  Obesity, Class I, BMI 30-34.9 Weight stable and hemoglobin A1cmuch improved, 6.2  Knee also arthritis, s/p TKR Takes Tylenol but still limited by chronic knee and leg pain  PAD  Recommended he keep a close eye on his leg pain If at some point he feels it is vascular may need to have intervention with Dr. [name] on his bilateral SFA disease   Disposition:   F/U  12 months   Total encounter time more than 25 minutes  Greater than 50% was spent in counseling and coordination of care with the patient    Orders Placed This Encounter  Procedures  . EKG 12-Lead     Signed, Esmond Plants, M.D., Ph.D. 05/02/2018  [place], [place]

## 2018-05-02 ENCOUNTER — Ambulatory Visit: Payer: PPO | Admitting: Cardiovascular Disease

## 2018-05-02 ENCOUNTER — Encounter: Payer: Self-pay | Admitting: Cardiovascular Disease

## 2018-05-02 VITALS — BP 160/66 | HR 54 | Ht 73.0 in | Wt 212.0 lb

## 2018-05-02 DIAGNOSIS — I10 Essential (primary) hypertension: Secondary | ICD-10-CM | POA: Diagnosis not present

## 2018-05-02 DIAGNOSIS — I739 Peripheral vascular disease, unspecified: Secondary | ICD-10-CM

## 2018-05-02 DIAGNOSIS — I2510 Atherosclerotic heart disease of native coronary artery without angina pectoris: Secondary | ICD-10-CM

## 2018-05-02 DIAGNOSIS — I25118 Atherosclerotic heart disease of native coronary artery with other forms of angina pectoris: Secondary | ICD-10-CM

## 2018-05-02 DIAGNOSIS — I25810 Atherosclerosis of coronary artery bypass graft(s) without angina pectoris: Secondary | ICD-10-CM | POA: Diagnosis not present

## 2018-05-02 DIAGNOSIS — I6523 Occlusion and stenosis of bilateral carotid arteries: Secondary | ICD-10-CM

## 2018-05-02 DIAGNOSIS — E785 Hyperlipidemia, unspecified: Secondary | ICD-10-CM | POA: Diagnosis not present

## 2018-05-02 NOTE — Patient Instructions (Addendum)
Monitor blood pressure, Call with numbers   Medication Instructions:   No medication changes made  Labwork:  No new labs needed  Testing/Procedures:  No further testing at this time   Follow-Up: It was a pleasure seeing you in the office today. Please call us if you have new issues that need to be addressed before your next appt.  [phone]  Your physician wants you to follow-up in: 12 months.  You will receive a reminder letter in the mail two months in advance. If you don't receive a letter, please call our office to schedule the follow-up appointment.  If you need a refill on your cardiac medications before your next appointment, please call your pharmacy.  For educational health videos Log in to : [email or web address] Or : [email or web address], password : [email or web address]

## 2018-05-03 ENCOUNTER — Ambulatory Visit: Payer: PPO

## 2018-05-11 ENCOUNTER — Other Ambulatory Visit: Payer: Self-pay | Admitting: Cardiovascular Disease

## 2018-06-07 ENCOUNTER — Ambulatory Visit (INDEPENDENT_AMBULATORY_CARE_PROVIDER_SITE_OTHER): Payer: PPO | Admitting: Family Medicine

## 2018-06-07 ENCOUNTER — Encounter: Payer: Self-pay | Admitting: Family Medicine

## 2018-06-07 VITALS — BP 144/70 | HR 62 | Temp 97.8°F | Ht 73.0 in | Wt 211.5 lb

## 2018-06-07 DIAGNOSIS — R7303 Prediabetes: Secondary | ICD-10-CM

## 2018-06-07 DIAGNOSIS — I10 Essential (primary) hypertension: Secondary | ICD-10-CM

## 2018-06-07 DIAGNOSIS — Z23 Encounter for immunization: Secondary | ICD-10-CM | POA: Diagnosis not present

## 2018-06-07 LAB — POCT GLYCOSYLATED HEMOGLOBIN (HGB A1C): Hemoglobin A1C: 5.8 % — AB (ref 4.0–5.6)

## 2018-06-07 NOTE — Progress Notes (Signed)
BP (!) 144/70 (BP Location: Right Arm, Cuff Size: Normal)   Pulse 62   Temp 97.8 F (36.6 C) (Oral)   Ht 6\' 1"  (1.854 m)   Wt 211 lb 8 oz (95.9 kg)   SpO2 96%   BMI 27.90 kg/m    CC: 6 mo f/u visit Subjective:    Patient ID: [name], male    DOB: September 03, 1940, 77 y.o.   MRN: [id number]  HPI: [name] is a 77 y.o. male presenting on 06/07/2018 for 6 mo follow up   HTN - complaint with imdur 30mg  daily. States at home BP running well controlled. No HA, vision changes, CP/tightness, SOB, leg swelling.   Prediabetes - does not regularly check sugars - last week 100-110 fasting, 135 post prandial. Compliant with antihyperglycemic regimen which includes: diet controlled. Denies low sugars or hypoglycemic symptoms. Denies paresthesias. Last diabetic eye exam 07/2017. Pneumovax: 2010. Prevnar: 2016. Glucometer brand: unsure. DSME: copmleted 2019. Lab Results  Component Value Date   HGBA1C 5.8 (A) 06/07/2018   Diabetic Foot Exam - Simple   Simple Foot Form Diabetic Foot exam was performed with the following findings:  Yes 06/07/2018  9:04 AM  Visual Inspection No deformities, no ulcerations, no other skin breakdown bilaterally:  Yes Sensation Testing Intact to touch and monofilament testing bilaterally:  Yes Pulse Check See comments:  Yes Comments Diminished pulses bilaterally    Lab Results  Component Value Date   MICROALBUR <0.7 11/30/2017     Relevant past medical, surgical, family and social history reviewed and updated as indicated. Interim medical history since our last visit reviewed. Allergies and medications reviewed and updated. Outpatient Medications Prior to Visit  Medication Sig Dispense Refill  . acetaminophen (TYLENOL) 500 MG tablet Take 2 tablets (1,000 mg total) by mouth as needed for moderate pain.    Marland Kitchen albuterol (PROVENTIL HFA;VENTOLIN HFA) 108 (90 Base) MCG/ACT inhaler Inhale 2 puffs into the lungs every 6 (six) hours as needed for  wheezing or shortness of breath.    Marland Kitchen aspirin EC 81 MG tablet Take 1 tablet (81 mg total) by mouth daily. 90 tablet 3  . docusate sodium (COLACE) 100 MG capsule Take 1 capsule (100 mg total) by mouth 2 (two) times daily. 60 capsule 0  . ezetimibe (ZETIA) 10 MG tablet TAKE ONE TABLET BY MOUTH EVERY DAY 90 tablet 0  . gabapentin (NEURONTIN) 300 MG capsule Take 300 mg by mouth 3 (three) times daily.    Marland Kitchen HYDROcodone-acetaminophen (NORCO/VICODIN) 5-325 MG tablet     . isosorbide mononitrate (IMDUR) 30 MG 24 hr tablet TAKE ONE TABLET BY MOUTH EVERY DAY 90 tablet 3  . ketoconazole (NIZORAL) 2 % cream     . nitroGLYCERIN (NITROSTAT) 0.4 MG SL tablet Place 1 tablet (0.4 mg total) under the tongue every 5 (five) minutes as needed for chest pain. 25 tablet 6  . simvastatin (ZOCOR) 40 MG tablet TAKE ONE TABLET BY MOUTH EVERY DAY 90 tablet 0  . tolterodine (DETROL LA) 4 MG 24 hr capsule Take 4 mg by mouth daily.    . isosorbide mononitrate (IMDUR) 30 MG 24 hr tablet TAKE ONE TABLET BY MOUTH EVERY DAY 90 tablet 3   No facility-administered medications prior to visit.      Per HPI unless specifically indicated in ROS section below Review of Systems     Objective:    BP (!) 144/70 (BP Location: Right Arm, Cuff Size: Normal)   Pulse 62  Temp 97.8 F (36.6 C) (Oral)   Ht 6\' 1"  (1.854 m)   Wt 211 lb 8 oz (95.9 kg)   SpO2 96%   BMI 27.90 kg/m   Wt Readings from Last 3 Encounters:  06/07/18 211 lb 8 oz (95.9 kg)  05/02/18 212 lb (96.2 kg)  03/26/18 207 lb (93.9 kg)    Physical Exam  Constitutional: He appears well-developed and well-nourished. No distress.  HENT:  Head: Normocephalic and atraumatic.  Mouth/Throat: Oropharynx is clear and moist. No oropharyngeal exudate.  Eyes: Pupils are equal, round, and reactive to light. Conjunctivae and EOM are normal. No scleral icterus.  Neck: Normal range of motion. Neck supple.  Cardiovascular: Normal rate, regular rhythm, normal heart sounds and  intact distal pulses.  No murmur heard. Pulmonary/Chest: Effort normal and breath sounds normal. No respiratory distress. He has no wheezes. He has no rales.  Musculoskeletal: He exhibits no edema.  See HPI for foot exam if done  Lymphadenopathy:    He has no cervical adenopathy.  Skin: Skin is warm and dry. No rash noted.  Psychiatric: He has a normal mood and affect.  Nursing note and vitals reviewed.  Results for orders placed or performed in visit on 06/07/18  POCT glycosylated hemoglobin (Hb A1C)  Result Value Ref Range   Hemoglobin A1C 5.8 (A) 4.0 - 5.6 %   HbA1c POC (<> result, manual entry)     HbA1c, POC (prediabetic range)     HbA1c, POC (controlled diabetic range)     Lab Results  Component Value Date   CHOL 127 11/30/2017   HDL 37.50 (L) 11/30/2017   LDLCALC 73 11/30/2017   LDLDIRECT 106.0 01/01/2016   TRIG 84.0 11/30/2017   CHOLHDL 3 11/30/2017       Assessment & Plan:   Problem List Items Addressed This Visit    Prediabetes - Primary    Congratulated on improved sugar readings noted today. Continue healthy diet and lifestyle choices. He is considering restarting gym.       Relevant Orders   POCT glycosylated hemoglobin (Hb A1C) (Completed)   Essential hypertension    Chronic, adequate readings - continue imdur.        Other Visit Diagnoses    Need for influenza vaccination       Relevant Orders   Flu Vaccine QUAD 36+ mos IM (Completed)       No orders of the defined types were placed in this encounter.  Orders Placed This Encounter  Procedures  . Flu Vaccine QUAD 36+ mos IM  . POCT glycosylated hemoglobin (Hb A1C)    Follow up plan: Return in about 6 months (around 12/07/2018) for medicare wellness visit, annual exam, prior fasting for blood work.  Ria Bush, MD

## 2018-06-07 NOTE — Patient Instructions (Addendum)
Flu shot today Congratulations on sugar control! Continue watching diet, get back in gym as able.  Return as needed or in 6 months for physical.

## 2018-06-07 NOTE — Assessment & Plan Note (Signed)
Congratulated on improved sugar readings noted today. Continue healthy diet and lifestyle choices. He is considering restarting gym.

## 2018-06-07 NOTE — Assessment & Plan Note (Signed)
Chronic, adequate readings - continue imdur.

## 2018-06-26 ENCOUNTER — Encounter: Payer: Self-pay | Admitting: Dietician

## 2018-06-27 DIAGNOSIS — Z471 Aftercare following joint replacement surgery: Secondary | ICD-10-CM | POA: Diagnosis not present

## 2018-06-27 DIAGNOSIS — Z96651 Presence of right artificial knee joint: Secondary | ICD-10-CM | POA: Diagnosis not present

## 2018-06-28 DIAGNOSIS — M7541 Impingement syndrome of right shoulder: Secondary | ICD-10-CM | POA: Diagnosis not present

## 2018-07-03 DIAGNOSIS — Z6828 Body mass index (BMI) 28.0-28.9, adult: Secondary | ICD-10-CM | POA: Diagnosis not present

## 2018-07-03 DIAGNOSIS — R030 Elevated blood-pressure reading, without diagnosis of hypertension: Secondary | ICD-10-CM | POA: Diagnosis not present

## 2018-07-03 DIAGNOSIS — M48061 Spinal stenosis, lumbar region without neurogenic claudication: Secondary | ICD-10-CM | POA: Diagnosis not present

## 2018-07-03 DIAGNOSIS — M545 Low back pain: Secondary | ICD-10-CM | POA: Diagnosis not present

## 2018-07-13 ENCOUNTER — Other Ambulatory Visit: Payer: Self-pay | Admitting: Cardiovascular Disease

## 2018-07-20 ENCOUNTER — Other Ambulatory Visit: Payer: Self-pay | Admitting: Pharmacist

## 2018-07-20 NOTE — Patient Outreach (Signed)
[place] [facility]) Care Management  07/20/2018  [name] April 29, 1941 [id number]   Incoming call from [name] in response to the [facility] Medication Adherence Campaign. Speak with patient. HIPAA identifiers verified and verbal consent received.  [name] reports that he takes his simvastatin once daily as directed. Patient denies regularly missing any doses. However, reports that this week he ran out of the medication. Reports that he called his pharmacy for a refill and was informed that they needed to fax his Cardiologist office for a new prescription.   Note per patient's chart that refills for both simvastatin and ezetimibe were sent in for the patient by his Cardiologist on 07/16/18. Call patient's [facility] to confirm that both prescriptions were received and filled.  [name] denies any further medication questions/concerns at this time. Counsel patient on the importance of medication adherence and the importance of contacting his pharmacy ahead of time for a refill in case the pharmacy needs to contact the provider office. Patient verbalizes understanding.  Will close pharmacy episode at this time.  Harlow Asa, PharmD, [place] [facility] [phone]

## 2018-07-22 DIAGNOSIS — S52501A Unspecified fracture of the lower end of right radius, initial encounter for closed fracture: Secondary | ICD-10-CM | POA: Diagnosis not present

## 2018-08-22 DIAGNOSIS — M79671 Pain in right foot: Secondary | ICD-10-CM | POA: Diagnosis not present

## 2018-08-22 DIAGNOSIS — Z96651 Presence of right artificial knee joint: Secondary | ICD-10-CM | POA: Diagnosis not present

## 2018-08-22 DIAGNOSIS — S8001XA Contusion of right knee, initial encounter: Secondary | ICD-10-CM | POA: Diagnosis not present

## 2018-08-22 DIAGNOSIS — Z471 Aftercare following joint replacement surgery: Secondary | ICD-10-CM | POA: Diagnosis not present

## 2018-08-22 DIAGNOSIS — M25551 Pain in right hip: Secondary | ICD-10-CM | POA: Diagnosis not present

## 2018-08-23 DIAGNOSIS — M7541 Impingement syndrome of right shoulder: Secondary | ICD-10-CM | POA: Diagnosis not present

## 2018-08-23 DIAGNOSIS — M25511 Pain in right shoulder: Secondary | ICD-10-CM | POA: Diagnosis not present

## 2018-08-30 ENCOUNTER — Ambulatory Visit: Payer: PPO | Admitting: Nurse Practitioner

## 2018-08-30 DIAGNOSIS — L570 Actinic keratosis: Secondary | ICD-10-CM | POA: Diagnosis not present

## 2018-08-30 DIAGNOSIS — L578 Other skin changes due to chronic exposure to nonionizing radiation: Secondary | ICD-10-CM | POA: Diagnosis not present

## 2018-08-31 ENCOUNTER — Encounter: Payer: Self-pay | Admitting: Nurse Practitioner

## 2018-08-31 ENCOUNTER — Ambulatory Visit: Payer: PPO | Admitting: Nurse Practitioner

## 2018-08-31 VITALS — BP 138/60 | HR 75 | Ht 72.0 in | Wt 215.0 lb

## 2018-08-31 DIAGNOSIS — M25551 Pain in right hip: Secondary | ICD-10-CM | POA: Diagnosis not present

## 2018-08-31 DIAGNOSIS — E782 Mixed hyperlipidemia: Secondary | ICD-10-CM

## 2018-08-31 DIAGNOSIS — I10 Essential (primary) hypertension: Secondary | ICD-10-CM

## 2018-08-31 DIAGNOSIS — I739 Peripheral vascular disease, unspecified: Secondary | ICD-10-CM

## 2018-08-31 DIAGNOSIS — I2510 Atherosclerotic heart disease of native coronary artery without angina pectoris: Secondary | ICD-10-CM

## 2018-08-31 DIAGNOSIS — M25511 Pain in right shoulder: Secondary | ICD-10-CM | POA: Diagnosis not present

## 2018-08-31 NOTE — Patient Instructions (Addendum)
Medication Instructions:  No changes If you need a refill on your cardiac medications before your next appointment, please call your pharmacy.   Lab work: BMET and CBC today for upcoming procedure. If you have labs (blood work) drawn today and your tests are completely normal, you will receive your results only by: Marland Kitchen MyChart Message (if you have MyChart) OR . A paper copy in the mail If you have any lab test that is abnormal or we need to change your treatment, we will call you to review the results.  Testing/Procedures: See next page  Follow-Up: At [facility], you and your health needs are our priority.  As part of our continuing mission to provide you with exceptional heart care, we have created designated Provider Care Teams.  These Care Teams include your primary Cardiologist (physician) and Advanced Practice Providers (APPs -  Physician Assistants and Nurse Practitioners) who all work together to provide you with the care you need, when you need it. You will need a follow up appointment in 4 weeks with Dr. Kathlyn Sacramento                [facility] CARDIOVASCULAR DIVISION [facility], [place] [address] [place] [address] Dept: [phone] Loc: [place]  08/31/2018  You are scheduled for a Peripheral Angiogram on Wednesday, January 29 with Dr. Kathlyn Sacramento.  1. Please arrive at the [facility] (Main Entrance A) at [facility]: [address] [place], [place] [address] at 8:30 AM (This time is two hours before your procedure to ensure your preparation). Free valet parking service is available.   Special note: Every effort is made to have your procedure done on time. Please understand that emergencies sometimes delay scheduled procedures.  2. Diet: Do not eat solid foods after midnight.  The patient may have clear liquids until 5am upon the day of the procedure.  3. Labs: Done  today here in our office for your procedure.  4. Medication instructions in preparation for your procedure:   On the morning of your procedure, take your Aspirin and any morning medicines NOT listed above.  You may use sips of water.  5. Plan for one night stay--bring personal belongings. 6. Bring a current list of your medications and current insurance cards. 7. You MUST have a responsible person to drive you home. 8. Someone MUST be with you the first 24 hours after you arrive home or your discharge will be delayed. 9. Please wear clothes that are easy to get on and off and wear slip-on shoes.  Thank you for allowing [place] to care for you!   -- [place] [facility]

## 2018-08-31 NOTE — H&P (View-Only) (Signed)
Cardiology Clinic Note   Patient Name: [name] Date of Encounter: 08/31/2018  Primary Care Provider:  Ria Bush, MD Primary Cardiologist:  Ida Rogue, MD  PV: Jerilynn Mages. 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  Past Medical History    Past Medical History:  Diagnosis Date  . BENIGN PROSTATIC HYPERTROPHY, WITH URINARY OBSTRUCTION 09/06/2007  . CAD s/p CABG    a. 1990 s/p MI-->CABG x 2; b. 2002 s/p BMS to LCX; c. 06/2015 Cath: LM 50ost, LAD 100ost/p, 18m/d, D2 nl, LCX  patent stent, RCA 80p (small), LIMA->LAD nl, VG->D2 nl-->Med Rx.  . Carotid arterial disease ([place])    a. 08/2016 Carotid U/S: <39% bilat.  . 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 Community acquired pneumonia of right lower lobe of lung ([place]) 06/05/2017  . 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 DVT, HX OF 1998  . GERD 04/30/2007  . Headache    hx migraines  . History of hiatal hernia   . History of shingles   . HYPERLIPIDEMIA 04/26/2007  . HYPERTENSION 04/30/2007  . INGUINAL HERNIA, RIGHT 05/26/2010  . Lumbar disc disease with radiculopathy   . OSA (obstructive sleep apnea)    a. did not tolerate CPAP.  Marland Kitchen Osteoarthritis   . PAD (peripheral artery disease) ([place])    a. 07/2017 LE duplex: RSFA 75-69m, LSFA 75-64m, 50-74d.  . Pneumonia 07/2014   [facility] hospitalization  . PSA, INCREASED 07/09/2008  . Spinal stenosis of lumbar region    Past Surgical History:  Procedure Laterality Date  . APPENDECTOMY     rupture  . CARDIAC CATHETERIZATION [name] 07/07/2015   Procedure: Left Heart Cath and Cors/Grafts Angiography;  Surgeon: [name], MD;  Location: [facility];  Service: Cardiovascular;   Laterality: [name];  . CATARACT EXTRACTION, BILATERAL    . CERVICAL SPINE SURGERY  12/2016   cervical stenosis [name])  . COLONOSCOPY  03/2010   HP polyp, diverticulosis, rpt 10 yrs ([name])  . CORONARY ARTERY BYPASS GRAFT  1990   3 vessel   . CYSTOSCOPY  12/23/10   [facility]  . KNEE ARTHROSCOPY Right    x2  . LAMINOTOMY  1986   L5/S1 lumbar laminotomy for two ruptured discs/fusion  . LUMBAR LAMINECTOMY/DECOMPRESSION MICRODISCECTOMY [name] 07/13/2016   Procedure: LUMBAR TWO-THREE, LUMBAR THREE-FOUR, LUMBAR FOUR-FIVE LAMINECTOMY AND FORAMINOTOMY;  Surgeon: [name], MD;  Location: [facility];  Service: Neurosurgery;  Laterality: [name];  LAMINECTOMY AND FORAMINOTOMY L2-L3, L3-L4,L4-L5  . TOTAL HIP ARTHROPLASTY Bilateral 1999,2000  . TOTAL KNEE ARTHROPLASTY Right 03/06/2017   Procedure: RIGHT TOTAL KNEE ARTHROPLASTY;  Surgeon: Gaynelle Arabian, MD;  Location: [facility];  Service: Orthopedics;  Laterality: Right;    Allergies  Allergies  Allergen Reactions  . Morphine Nausea Only and Other (See Comments)    Irritability   . Vioxx [Rofecoxib] Other (See Comments)    Bleeding out    History of Present Illness    78 year old male with the above complex past medical history including coronary artery disease status post MI and CABG x2 in 1990.  This was followed by bare-metal stenting to the left circumflex in 2002.  Subsequent catheterization in November 2016 showed 2 of 2 patent grafts with a patent circumflex stent.  He has known  small vessel and diffuse right coronary artery disease which has been medically managed with long-acting nitrate therapy.  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  He is also followed here for peripheral arterial disease with chronic bilateral lower extremity claudication.  Prior lower extremity arterial duplex in 2018 showed severe bilateral superficial  femoral artery disease.  When he was last seen in peripheral vascular clinic in July, bilateral calf claudication symptoms were stable and it was felt that medical therapy was warranted given significant other limitations to ambulation such as leg pain from spinal stenosis, knee arthritis, and neuropathy.  It was felt however that he would benefit from peripheral angiography if claudication symptoms worsen.  Over the past 6 months, [name] has had progressive bilateral calf discomfort, noting that he can only walk about 150 feet prior to experiencing bilateral calf and hamstring tightening and that by 300 feet, he has to stop walking completely.  This is much worse than it was 6 months ago.  Over the past 3 weeks, he has noted occasional tightness in his left calf when he is lying in bed at night.  He has had some worsening of numbness of the plantar surfaces of his feet though recognizes that this may be secondary to neuropathy and does take Neurontin.  He is now interested in pursuing peripheral angiography.  He does have chronic, stable dyspnea on exertion though notes that activity is so limited by his legs that he is not able to walk far enough to become dyspneic.  He has not been experiencing chest pain and denies PND, orthopnea, dizziness, syncope, edema, or early satiety.  Home Medications    Prior to Admission medications   Medication Sig Start Date End Date Taking? Authorizing Provider  acetaminophen (TYLENOL) 500 MG tablet Take 2 tablets (1,000 mg total) by mouth as needed for moderate pain. 12/05/17  Yes Ria Bush, MD  albuterol (PROVENTIL HFA;VENTOLIN HFA) 108 (90 Base) MCG/ACT inhaler Inhale 2 puffs into the lungs every 6 (six) hours as needed for wheezing or shortness of breath.   Yes [provider]  aspirin EC 81 MG tablet Take 1 tablet (81 mg total) by mouth daily. 05/01/17  Yes Gollan, Kathlene November, MD  docusate sodium (COLACE) 100 MG capsule Take 1 capsule (100 mg  total) by mouth 2 (two) times daily. 07/15/16  Yes [name], MD  ezetimibe (ZETIA) 10 MG tablet TAKE ONE TABLET EVERY DAY 07/16/18  Yes Gollan, Kathlene November, MD  gabapentin (NEURONTIN) 300 MG capsule Take 300 mg by mouth 3 (three) times daily.   Yes [provider]  HYDROcodone-acetaminophen (NORCO/VICODIN) 5-325 MG tablet  11/24/17  Yes [provider]  isosorbide mononitrate (IMDUR) 30 MG 24 hr tablet TAKE ONE TABLET BY MOUTH EVERY DAY 05/14/18  Yes [name], MD  ketoconazole (NIZORAL) 2 % cream  04/19/18  Yes [provider]  nitroGLYCERIN (NITROSTAT) 0.4 MG SL tablet Place 1 tablet (0.4 mg total) under the tongue every 5 (five) minutes as needed for chest pain. 05/01/17  Yes [name], MD  simvastatin (ZOCOR) 40 MG tablet TAKE ONE TABLET EVERY DAY 07/16/18  Yes Gollan, Kathlene November, MD  tolterodine (DETROL LA) 4 MG 24 hr capsule Take 4 mg by mouth daily.   Yes [provider]    Family History    Family History  Problem Relation Age of Onset  . Stroke Mother   .  Heart attack Mother   . Diabetes Mother   . Stroke Father   . Heart disease Father   . Kidney disease Father        PCKD  . Cancer Sister        throat  . Diabetes Brother   . Kidney disease Sister        PCKD  . Cancer Other        5/7 nephews with lung cancer   He indicated that his mother is deceased. He indicated that his father is deceased. He indicated that one of his two sisters is deceased. He indicated that his brother is deceased. He indicated that the status of his other is unknown.  Social History    Social History   Socioeconomic History  . Marital status: Married    Spouse name: Not on file  . Number of children: Not on file  . Years of education: Not on file  . Highest education level: Not on file  Occupational History  . Not on file  Social Needs  . Financial resource strain: Not on file  . Food insecurity:    Worry: Not on file    Inability:  Not on file  . Transportation needs:    Medical: Not on file    Non-medical: Not on file  Tobacco Use  . Smoking status: Former Smoker    Packs/day: 1.00    Years: 25.00    Pack years: 25.00    Types: Cigarettes    Last attempt to quit: 08/15/1988    Years since quitting: 30.0  . Smokeless tobacco: Never Used  Substance and Sexual Activity  . Alcohol use: No    Alcohol/week: 0.0 standard drinks  . Drug use: No  . Sexual activity: Yes  Lifestyle  . Physical activity:    Days per week: Not on file    Minutes per session: Not on file  . Stress: Not on file  Relationships  . Social connections:    Talks on phone: Not on file    Gets together: Not on file    Attends religious service: Not on file    Active member of club or organization: Not on file    Attends meetings of clubs or organizations: Not on file    Relationship status: Not on file  . Intimate partner violence:    Fear of current or ex partner: Not on file    Emotionally abused: Not on file    Physically abused: Not on file    Forced sexual activity: Not on file  Other Topics Concern  . Not on file  Social History Narrative   Married, wife [name], for 25 years in 2016. 1 daughter and 3 grandkids from first marriage.    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    Diet: good water, fruits/vegetables daily     Review of Systems    General:  No chills, fever, night sweats or weight changes.  Cardiovascular:  No chest pain, +++ stable dyspnea on exertion, no edema, orthopnea, palpitations, paroxysmal nocturnal dyspnea.   +++ bilat calf/hamstring claudication. Dermatological: No rash, lesions/masses Respiratory: No cough, dyspnea Urologic: No hematuria, dysuria Abdominal:   No nausea, vomiting, diarrhea, bright red blood per rectum, melena, or hematemesis Neurologic:  No visual  changes, wkns, changes in mental status.  +++ neuropathic pain/numbness  of plantar surfaces of his feet.   All other systems reviewed and are otherwise negative except as noted above.  Physical Exam    VS:  BP 138/60 (BP Location: Left Arm, Patient Position: Sitting, Cuff Size: Normal)   Pulse 75   Ht 6' (1.829 m)   Wt 215 lb (97.5 kg)   BMI 29.16 kg/m  , BMI Body mass index is 29.16 kg/m. GEN: Well nourished, well developed, in no acute distress. HEENT: normal. Neck: Supple, no JVD, carotid bruits, or masses. Cardiac: RRR, no murmurs, rubs, or gallops. No clubbing, cyanosis, edema.  Radials 2+ and equal bilaterally.  Right DP nonpalpable, PT 1+.  Left DP and PT nonpalpable.  Normal capillary refill. Respiratory:  Respirations regular and unlabored, clear to auscultation bilaterally. GI: Soft, nontender, nondistended, BS + x 4. MS: no deformity or atrophy. Skin: warm and dry, no rash. Neuro:  Strength and sensation are intact. Psych: Normal affect.  Accessory Clinical Findings    ECG personally reviewed by me today-sinus arrhythmia, 75, left axis deviation- No acute changes  Assessment & Plan   1.  Bilateral lower extremity claudication/peripheral arterial disease: Patient with progressive bilateral lower extremity claudication over the past 6 months, worsening now at about 150 feet and causing him to stop in his tracks at about 300 feet.  Discomfort and tightness predominantly in his calves and hamstrings.  Symptoms resolve within 5 minutes with rest.  He has also had intermittent resting symptoms in his left calf over the past 3 wks.  This has significantly limited his lifestyle and he is now interested in peripheral angiography.  I have discussed his case with Dr. [name] today and we will plan on peripheral angiography on Wednesday, January 29.  Check labs today.  Continue aspirin and statin therapy.  2.  Coronary artery disease: Status post prior CABG x2 and subsequent left  circumflex stenting.  Stable anatomy on catheterization in 2016 with known small vessel RCA disease.  He has not been experiencing significant chest pain and remains on nitrate, statin, Zetia, and aspirin therapy.  3.  Essential hypertension: Pressure slightly elevated today.  Generally better at home.  Continue nitrate.  4.  Hyperlipidemia: LDL 73 in April 2019.  He remains on statin and Zetia therapy.    5.  Disposition: Peripheral angiography scheduled for January 29. Labs today.  Plan to follow-up approximately 2 weeks afterward.  Murray Hodgkins, NP 08/31/2018, 1:03 PM

## 2018-08-31 NOTE — Progress Notes (Signed)
Cardiology Clinic Note   Patient Name: [name] Date of Encounter: 08/31/2018  Primary Care Provider:  Ria Bush, MD Primary Cardiologist:  [name] Rogue, MD  PV: [name] Mages. 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  Past Medical History    Past Medical History:  Diagnosis Date  . BENIGN PROSTATIC HYPERTROPHY, WITH URINARY OBSTRUCTION 09/06/2007  . CAD s/p CABG    a. 1990 s/p MI-->CABG x 2; b. 2002 s/p BMS to LCX; c. 06/2015 Cath: LM 50ost, LAD 100ost/p, 32m/d, D2 nl, LCX  patent stent, RCA 80p (small), LIMA->LAD nl, VG->D2 nl-->Med Rx.  . 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 08/2016 Carotid U/S: <39% bilat.  . 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 Community acquired pneumonia of right lower lobe of lung ([place]) 06/05/2017  . 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 DVT, HX OF 1998  . GERD 04/30/2007  . Headache    hx migraines  . History of hiatal hernia   . History of shingles   . HYPERLIPIDEMIA 04/26/2007  . HYPERTENSION 04/30/2007  . INGUINAL HERNIA, RIGHT 05/26/2010  . Lumbar disc disease with radiculopathy   . OSA (obstructive sleep apnea)    a. did not tolerate CPAP.  Marland Kitchen Osteoarthritis   . PAD (peripheral artery disease) ([place])    a. 07/2017 LE duplex: RSFA 75-34m, LSFA 75-79m, 50-74d.  . Pneumonia 07/2014   [facility] hospitalization  . PSA, INCREASED 07/09/2008  . Spinal stenosis of lumbar region    Past Surgical History:  Procedure Laterality Date  . APPENDECTOMY     rupture  . CARDIAC CATHETERIZATION [name] 07/07/2015   Procedure: Left Heart Cath and Cors/Grafts Angiography;  Surgeon: [name] Merritts, MD;  Location: [facility];  Service: Cardiovascular;   Laterality: [name];  . CATARACT EXTRACTION, BILATERAL    . CERVICAL SPINE SURGERY  12/2016   cervical stenosis [name])  . COLONOSCOPY  03/2010   HP polyp, diverticulosis, rpt 10 yrs ([name])  . CORONARY ARTERY BYPASS GRAFT  1990   3 vessel   . CYSTOSCOPY  12/23/10   [name]  . KNEE ARTHROSCOPY Right    x2  . LAMINOTOMY  1986   L5/S1 lumbar laminotomy for two ruptured discs/fusion  . LUMBAR LAMINECTOMY/DECOMPRESSION MICRODISCECTOMY [name] 07/13/2016   Procedure: LUMBAR TWO-THREE, LUMBAR THREE-FOUR, LUMBAR FOUR-FIVE LAMINECTOMY AND FORAMINOTOMY;  Surgeon: [name] Pies, MD;  Location: [facility];  Service: Neurosurgery;  Laterality: [name];  LAMINECTOMY AND FORAMINOTOMY L2-L3, L3-L4,L4-L5  . TOTAL HIP ARTHROPLASTY Bilateral 1999,2000  . TOTAL KNEE ARTHROPLASTY Right 03/06/2017   Procedure: RIGHT TOTAL KNEE ARTHROPLASTY;  Surgeon: [name] Arabian, MD;  Location: [facility];  Service: Orthopedics;  Laterality: Right;    Allergies  Allergies  Allergen Reactions  . Morphine Nausea Only and Other (See Comments)    Irritability   . Vioxx [Rofecoxib] Other (See Comments)    Bleeding out    History of Present Illness    78 year old male with the above complex past medical history including coronary artery disease status post MI and CABG x2 in 1990.  This was followed by bare-metal stenting to the left circumflex in 2002.  Subsequent catheterization in [name] 2016 showed 2 of 2 patent grafts with a patent circumflex stent.  He has known  small vessel and diffuse right coronary artery disease which has been medically managed with long-acting nitrate therapy.  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  He is also followed here for peripheral arterial disease with chronic bilateral lower extremity claudication.  Prior lower extremity arterial duplex in 2018 showed severe bilateral superficial  femoral artery disease.  When he was last seen in peripheral vascular clinic in July, bilateral calf claudication symptoms were stable and it was felt that medical therapy was warranted given significant other limitations to ambulation such as leg pain from spinal stenosis, knee arthritis, and neuropathy.  It was felt however that he would benefit from peripheral angiography if claudication symptoms worsen.  Over the past 6 months, [name] has had progressive bilateral calf discomfort, noting that he can only walk about 150 feet prior to experiencing bilateral calf and hamstring tightening and that by 300 feet, he has to stop walking completely.  This is much worse than it was 6 months ago.  Over the past 3 weeks, he has noted occasional tightness in his left calf when he is lying in bed at night.  He has had some worsening of numbness of the plantar surfaces of his feet though recognizes that this may be secondary to neuropathy and does take [name].  He is now interested in pursuing peripheral angiography.  He does have chronic, stable dyspnea on exertion though notes that activity is so limited by his legs that he is not able to walk far enough to become dyspneic.  He has not been experiencing chest pain and denies PND, orthopnea, dizziness, syncope, edema, or early satiety.  Home Medications    Prior to Admission medications   Medication Sig Start Date End Date Taking? Authorizing Provider  acetaminophen (TYLENOL) 500 MG tablet Take 2 tablets (1,000 mg total) by mouth as needed for moderate pain. 12/05/17  Yes [name] Bush, MD  albuterol (PROVENTIL HFA;VENTOLIN HFA) 108 (90 Base) MCG/ACT inhaler Inhale 2 puffs into the lungs every 6 (six) hours as needed for wheezing or shortness of breath.   Yes [provider]  aspirin EC 81 MG tablet Take 1 tablet (81 mg total) by mouth daily. 05/01/17  Yes [name], [name] November, MD  docusate sodium (COLACE) 100 MG capsule Take 1 capsule (100 mg  total) by mouth 2 (two) times daily. 07/15/16  Yes [name] Pies, MD  ezetimibe (ZETIA) 10 MG tablet TAKE ONE TABLET EVERY DAY 07/16/18  Yes [name], [name] November, MD  gabapentin ([name]) 300 MG capsule Take 300 mg by mouth 3 (three) times daily.   Yes [provider]  HYDROcodone-acetaminophen (NORCO/VICODIN) 5-325 MG tablet  11/24/17  Yes [provider]  isosorbide mononitrate (IMDUR) 30 MG 24 hr tablet TAKE ONE TABLET BY MOUTH EVERY DAY 05/14/18  Yes [name], MD  ketoconazole (NIZORAL) 2 % cream  04/19/18  Yes [provider]  nitroGLYCERIN (NITROSTAT) 0.4 MG SL tablet Place 1 tablet (0.4 mg total) under the tongue every 5 (five) minutes as needed for chest pain. 05/01/17  Yes [name] Merritts, MD  simvastatin (ZOCOR) 40 MG tablet TAKE ONE TABLET EVERY DAY 07/16/18  Yes [name], [name] November, MD  tolterodine (DETROL LA) 4 MG 24 hr capsule Take 4 mg by mouth daily.   Yes [provider]    Family History    Family History  Problem Relation Age of Onset  . Stroke Mother   .  Heart attack Mother   . Diabetes Mother   . Stroke Father   . Heart disease Father   . Kidney disease Father        PCKD  . Cancer Sister        throat  . Diabetes Brother   . Kidney disease Sister        PCKD  . Cancer Other        5/7 nephews with lung cancer   He indicated that his mother is deceased. He indicated that his father is deceased. He indicated that one of his two sisters is deceased. He indicated that his brother is deceased. He indicated that the status of his other is unknown.  Social History    Social History   Socioeconomic History  . Marital status: Married    Spouse name: Not on file  . Number of children: Not on file  . Years of education: Not on file  . Highest education level: Not on file  Occupational History  . Not on file  Social Needs  . Financial resource strain: Not on file  . Food insecurity:    Worry: Not on file    Inability:  Not on file  . Transportation needs:    Medical: Not on file    Non-medical: Not on file  Tobacco Use  . Smoking status: Former Smoker    Packs/day: 1.00    Years: 25.00    Pack years: 25.00    Types: Cigarettes    Last attempt to quit: 08/15/1988    Years since quitting: 30.0  . Smokeless tobacco: Never Used  Substance and Sexual Activity  . Alcohol use: No    Alcohol/week: 0.0 standard drinks  . Drug use: No  . Sexual activity: Yes  Lifestyle  . Physical activity:    Days per week: Not on file    Minutes per session: Not on file  . Stress: Not on file  Relationships  . Social connections:    Talks on phone: Not on file    Gets together: Not on file    Attends religious service: Not on file    Active member of club or organization: Not on file    Attends meetings of clubs or organizations: Not on file    Relationship status: Not on file  . Intimate partner violence:    Fear of current or ex partner: Not on file    Emotionally abused: Not on file    Physically abused: Not on file    Forced sexual activity: Not on file  Other Topics Concern  . Not on file  Social History Narrative   Married, wife [name], for 25 years in 2016. 1 daughter and 3 grandkids from first marriage.    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    Diet: good water, fruits/vegetables daily     Review of Systems    General:  No chills, fever, night sweats or weight changes.  Cardiovascular:  No chest pain, +++ stable dyspnea on exertion, no edema, orthopnea, palpitations, paroxysmal nocturnal dyspnea.   +++ bilat calf/hamstring claudication. Dermatological: No rash, lesions/masses Respiratory: No cough, dyspnea Urologic: No hematuria, dysuria Abdominal:   No nausea, vomiting, diarrhea, bright red blood per rectum, melena, or hematemesis Neurologic:  No visual  changes, wkns, changes in mental status.  +++ neuropathic pain/numbness  of plantar surfaces of his feet.   All other systems reviewed and are otherwise negative except as noted above.  Physical Exam    VS:  BP 138/60 (BP Location: Left Arm, Patient Position: Sitting, Cuff Size: Normal)   Pulse 75   Ht 6' (1.829 m)   Wt 215 lb (97.5 kg)   BMI 29.16 kg/m  , BMI Body mass index is 29.16 kg/m. GEN: Well nourished, well developed, in no acute distress. HEENT: normal. Neck: Supple, no JVD, carotid bruits, or masses. Cardiac: RRR, no murmurs, rubs, or gallops. No clubbing, cyanosis, edema.  Radials 2+ and equal bilaterally.  Right DP nonpalpable, PT 1+.  Left DP and PT nonpalpable.  Normal capillary refill. Respiratory:  Respirations regular and unlabored, clear to auscultation bilaterally. GI: Soft, nontender, nondistended, BS + x 4. MS: no deformity or atrophy. Skin: warm and dry, no rash. Neuro:  Strength and sensation are intact. Psych: Normal affect.  Accessory Clinical Findings    ECG personally reviewed by me today-sinus arrhythmia, 75, left axis deviation- No acute changes  Assessment & Plan   1.  Bilateral lower extremity claudication/peripheral arterial disease: Patient with progressive bilateral lower extremity claudication over the past 6 months, worsening now at about 150 feet and causing him to stop in his tracks at about 300 feet.  Discomfort and tightness predominantly in his calves and hamstrings.  Symptoms resolve within 5 minutes with rest.  He has also had intermittent resting symptoms in his left calf over the past 3 wks.  This has significantly limited his lifestyle and he is now interested in peripheral angiography.  I have discussed his case with Dr. Fletcher [name] today and we will plan on peripheral angiography on Wednesday, January 29.  Check labs today.  Continue aspirin and statin therapy.  2.  Coronary artery disease: Status post prior CABG x2 and subsequent left  circumflex stenting.  Stable anatomy on catheterization in 2016 with known small vessel RCA disease.  He has not been experiencing significant chest pain and remains on nitrate, statin, Zetia, and aspirin therapy.  3.  Essential hypertension: Pressure slightly elevated today.  Generally better at home.  Continue nitrate.  4.  Hyperlipidemia: LDL 73 in April 2019.  He remains on statin and Zetia therapy.    5.  Disposition: Peripheral angiography scheduled for January 29. Labs today.  Plan to follow-up approximately 2 weeks afterward.  Murray Hodgkins, NP 08/31/2018, 1:03 PM

## 2018-09-01 LAB — BASIC METABOLIC PANEL
BUN/Creatinine Ratio: 18 (ref 10–24)
BUN: 19 mg/dL (ref 8–27)
CO2: 20 mmol/L (ref 20–29)
Calcium: 8.9 mg/dL (ref 8.6–10.2)
Chloride: 107 mmol/L — ABNORMAL HIGH (ref 96–106)
Creatinine, Ser: 1.08 mg/dL (ref 0.76–1.27)
GFR calc Af Amer: 76 mL/min/{1.73_m2} (ref >59)
GFR calc non Af Amer: 66 mL/min/{1.73_m2} (ref >59)
Glucose: 115 mg/dL — ABNORMAL HIGH (ref 65–99)
Potassium: 4.5 mmol/L (ref 3.5–5.2)
Sodium: 144 mmol/L (ref 134–144)

## 2018-09-01 LAB — CBC WITH DIFFERENTIAL/PLATELET
Basophils Absolute: 0.1 10*3/uL (ref 0.0–0.2)
Basos: 2 %
EOS (ABSOLUTE): 0.3 10*3/uL (ref 0.0–0.4)
Eos: 5 %
Hematocrit: 39.5 % (ref 37.5–51.0)
Hemoglobin: 14 g/dL (ref 13.0–17.7)
Immature Grans (Abs): 0 10*3/uL (ref 0.0–0.1)
Immature Granulocytes: 0 %
Lymphocytes Absolute: 1.1 10*3/uL (ref 0.7–3.1)
Lymphs: 23 %
MCH: 32.3 pg (ref 26.6–33.0)
MCHC: 35.4 g/dL (ref 31.5–35.7)
MCV: 91 fL (ref 79–97)
Monocytes Absolute: 0.5 10*3/uL (ref 0.1–0.9)
Monocytes: 10 %
Neutrophils Absolute: 2.8 10*3/uL (ref 1.4–7.0)
Neutrophils: 60 %
Platelets: 181 10*3/uL (ref 150–450)
RBC: 4.33 x10E6/uL (ref 4.14–5.80)
RDW: 12.5 % (ref 11.6–15.4)
WBC: 4.7 10*3/uL (ref 3.4–10.8)

## 2018-09-04 ENCOUNTER — Telehealth: Payer: Self-pay

## 2018-09-04 DIAGNOSIS — H02883 Meibomian gland dysfunction of right eye, unspecified eyelid: Secondary | ICD-10-CM | POA: Diagnosis not present

## 2018-09-04 NOTE — Telephone Encounter (Signed)
-----   Message from Theora Gianotti, NP sent at 09/03/2018 10:27 AM EST ----- Cbc, renal fxn, lytes acceptable for PV angio next week.

## 2018-09-04 NOTE — Telephone Encounter (Signed)
Patient calling to discuss recent testing results  ° °Please call  ° °

## 2018-09-04 NOTE — Telephone Encounter (Signed)
Patient made aware of results and verbalized understanding.  

## 2018-09-04 NOTE — Telephone Encounter (Signed)
Attempted to call patient. LMTCB 1/21 

## 2018-09-10 ENCOUNTER — Encounter: Payer: Self-pay | Admitting: Family Medicine

## 2018-09-10 ENCOUNTER — Telehealth: Payer: Self-pay | Admitting: *Deleted

## 2018-09-10 DIAGNOSIS — M25511 Pain in right shoulder: Secondary | ICD-10-CM

## 2018-09-10 DIAGNOSIS — M7541 Impingement syndrome of right shoulder: Secondary | ICD-10-CM | POA: Diagnosis not present

## 2018-09-10 DIAGNOSIS — G8929 Other chronic pain: Secondary | ICD-10-CM | POA: Insufficient documentation

## 2018-09-10 NOTE — Telephone Encounter (Signed)
Pt contacted pre-PV procedure  scheduled at [facility] for: Wednesday September 12, 2018 10:30 AM Verified arrival time and place: [place] Entrance A at: 8:30 AM  No solid food after midnight prior to cath, clear liquids until 5 AM day of procedure. Contrast allergy: no Verified no diabetes medications.  AM meds can be  taken pre-cath with sip of water including: ASA 81 mg  Confirmed patient has responsible person to drive home post procedure and for 24 hours after you arrive home: yes

## 2018-09-12 ENCOUNTER — Other Ambulatory Visit: Payer: Self-pay

## 2018-09-12 ENCOUNTER — Encounter (HOSPITAL_COMMUNITY)
Admission: RE | Disposition: A | Discharge status: Home / Self Care | Payer: Self-pay | Source: Home / Self Care | Attending: Cardiovascular Disease

## 2018-09-12 ENCOUNTER — Ambulatory Visit (HOSPITAL_COMMUNITY)
Admission: RE | Admit: 2018-09-12 | Discharge: 2018-09-12 | Disposition: A | Discharge status: Home / Self Care | Payer: PPO | Attending: Cardiovascular Disease | Admitting: Cardiovascular Disease

## 2018-09-12 ENCOUNTER — Telehealth: Payer: Self-pay | Admitting: *Deleted

## 2018-09-12 DIAGNOSIS — Z7982 Long term (current) use of aspirin: Secondary | ICD-10-CM | POA: Diagnosis not present

## 2018-09-12 DIAGNOSIS — Z888 Allergy status to other drugs, medicaments and biological substances status: Secondary | ICD-10-CM | POA: Insufficient documentation

## 2018-09-12 DIAGNOSIS — Z955 Presence of coronary angioplasty implant and graft: Secondary | ICD-10-CM | POA: Diagnosis not present

## 2018-09-12 DIAGNOSIS — I6523 Occlusion and stenosis of bilateral carotid arteries: Secondary | ICD-10-CM | POA: Insufficient documentation

## 2018-09-12 DIAGNOSIS — G629 Polyneuropathy, unspecified: Secondary | ICD-10-CM | POA: Diagnosis not present

## 2018-09-12 DIAGNOSIS — N400 Enlarged prostate without lower urinary tract symptoms: Secondary | ICD-10-CM | POA: Insufficient documentation

## 2018-09-12 DIAGNOSIS — Z79899 Other long term (current) drug therapy: Secondary | ICD-10-CM | POA: Insufficient documentation

## 2018-09-12 DIAGNOSIS — Z8249 Family history of ischemic heart disease and other diseases of the circulatory system: Secondary | ICD-10-CM | POA: Insufficient documentation

## 2018-09-12 DIAGNOSIS — I70213 Atherosclerosis of native arteries of extremities with intermittent claudication, bilateral legs: Secondary | ICD-10-CM | POA: Insufficient documentation

## 2018-09-12 DIAGNOSIS — I701 Atherosclerosis of renal artery: Secondary | ICD-10-CM | POA: Diagnosis not present

## 2018-09-12 DIAGNOSIS — I2510 Atherosclerotic heart disease of native coronary artery without angina pectoris: Secondary | ICD-10-CM | POA: Insufficient documentation

## 2018-09-12 DIAGNOSIS — Z87891 Personal history of nicotine dependence: Secondary | ICD-10-CM | POA: Diagnosis not present

## 2018-09-12 DIAGNOSIS — I252 Old myocardial infarction: Secondary | ICD-10-CM | POA: Diagnosis not present

## 2018-09-12 DIAGNOSIS — Z823 Family history of stroke: Secondary | ICD-10-CM | POA: Diagnosis not present

## 2018-09-12 DIAGNOSIS — K219 Gastro-esophageal reflux disease without esophagitis: Secondary | ICD-10-CM | POA: Diagnosis not present

## 2018-09-12 DIAGNOSIS — Z951 Presence of aortocoronary bypass graft: Secondary | ICD-10-CM | POA: Diagnosis not present

## 2018-09-12 DIAGNOSIS — I10 Essential (primary) hypertension: Secondary | ICD-10-CM | POA: Insufficient documentation

## 2018-09-12 DIAGNOSIS — Z86718 Personal history of other venous thrombosis and embolism: Secondary | ICD-10-CM | POA: Insufficient documentation

## 2018-09-12 DIAGNOSIS — M1990 Unspecified osteoarthritis, unspecified site: Secondary | ICD-10-CM | POA: Diagnosis not present

## 2018-09-12 DIAGNOSIS — G4733 Obstructive sleep apnea (adult) (pediatric): Secondary | ICD-10-CM | POA: Insufficient documentation

## 2018-09-12 DIAGNOSIS — E785 Hyperlipidemia, unspecified: Secondary | ICD-10-CM | POA: Diagnosis not present

## 2018-09-12 DIAGNOSIS — Z885 Allergy status to narcotic agent status: Secondary | ICD-10-CM | POA: Diagnosis not present

## 2018-09-12 DIAGNOSIS — I739 Peripheral vascular disease, unspecified: Secondary | ICD-10-CM

## 2018-09-12 HISTORY — PX: ABDOMINAL AORTOGRAM W/LOWER EXTREMITY: CATH118223

## 2018-09-12 LAB — POCT I-STAT 4, (NA,K, GLUC, HGB,HCT)
Glucose, Bld: 94 mg/dL (ref 70–99)
HCT: 44 % (ref 39.0–52.0)
Hemoglobin: 15 g/dL (ref 13.0–17.0)
Potassium: 4.5 mmol/L (ref 3.5–5.1)
Sodium: 146 mmol/L — ABNORMAL HIGH (ref 135–145)

## 2018-09-12 LAB — POCT I-STAT CREATININE: Creatinine, Ser: 1.2 mg/dL (ref 0.61–1.24)

## 2018-09-12 SURGERY — ABDOMINAL AORTOGRAM W/LOWER EXTREMITY
Anesthesia: LOCAL

## 2018-09-12 MED ORDER — SODIUM CHLORIDE 0.9 % IV SOLN
INTRAVENOUS | Status: DC
  Administered 2018-09-12: 12:00:00 via INTRAVENOUS

## 2018-09-12 MED ORDER — HEPARIN (PORCINE) IN NACL 1000-0.9 UT/500ML-% IV SOLN
INTRAVENOUS | Status: AC
  Filled 2018-09-12: qty 1000

## 2018-09-12 MED ORDER — HEPARIN (PORCINE) IN NACL 1000-0.9 UT/500ML-% IV SOLN
INTRAVENOUS | Status: DC | PRN
  Administered 2018-09-12 (×2): 500 mL

## 2018-09-12 MED ORDER — LIDOCAINE HCL (PF) 1 % IJ SOLN
INTRAMUSCULAR | Status: DC | PRN
  Administered 2018-09-12: 15 mL via INTRADERMAL

## 2018-09-12 MED ORDER — SODIUM CHLORIDE 0.9% FLUSH: 3.0000 mL | INTRAVENOUS | Status: DC | PRN

## 2018-09-12 MED ORDER — LABETALOL HCL 5 MG/ML IV SOLN: 10.0000 mg | INTRAVENOUS | Status: DC | PRN

## 2018-09-12 MED ORDER — FENTANYL CITRATE (PF) 100 MCG/2ML IJ SOLN
INTRAMUSCULAR | Status: DC | PRN
  Administered 2018-09-12: 25 ug via INTRAVENOUS

## 2018-09-12 MED ORDER — MIDAZOLAM HCL 2 MG/2ML IJ SOLN
INTRAMUSCULAR | Status: AC
  Filled 2018-09-12: qty 2

## 2018-09-12 MED ORDER — LIDOCAINE HCL (PF) 1 % IJ SOLN
INTRAMUSCULAR | Status: AC
  Filled 2018-09-12: qty 30

## 2018-09-12 MED ORDER — IODIXANOL 320 MG/ML IV SOLN
INTRAVENOUS | Status: DC | PRN
  Administered 2018-09-12: 97 mL via INTRA_ARTERIAL

## 2018-09-12 MED ORDER — FENTANYL CITRATE (PF) 100 MCG/2ML IJ SOLN
INTRAMUSCULAR | Status: AC
  Filled 2018-09-12: qty 2

## 2018-09-12 MED ORDER — ONDANSETRON HCL 4 MG/2ML IJ SOLN: 4.0000 mg | Freq: Four times a day (QID) | INTRAMUSCULAR | Status: DC | PRN

## 2018-09-12 MED ORDER — SODIUM CHLORIDE 0.9 % IV SOLN: INTRAVENOUS | Status: DC

## 2018-09-12 MED ORDER — MIDAZOLAM HCL 2 MG/2ML IJ SOLN
INTRAMUSCULAR | Status: DC | PRN
  Administered 2018-09-12: 1 mg via INTRAVENOUS

## 2018-09-12 MED ORDER — ACETAMINOPHEN 325 MG PO TABS: 650.0000 mg | ORAL_TABLET | ORAL | Status: DC | PRN

## 2018-09-12 MED ORDER — SODIUM CHLORIDE 0.9% FLUSH: 3.0000 mL | Freq: Two times a day (BID) | INTRAVENOUS | Status: DC

## 2018-09-12 MED ORDER — SODIUM CHLORIDE 0.9 % IV SOLN: 250.0000 mL | INTRAVENOUS | Status: DC | PRN

## 2018-09-12 SURGICAL SUPPLY — 11 items
CATH OMNI FLUSH 5F 65CM (CATHETERS) ×1 IMPLANT
CLOSURE MYNX CONTROL 5F (Vascular Products) ×1 IMPLANT
KIT MICROPUNCTURE NIT STIFF (SHEATH) ×1 IMPLANT
KIT PV (KITS) ×2 IMPLANT
SHEATH PINNACLE 5F 10CM (SHEATH) ×1 IMPLANT
STOPCOCK MORSE 400PSI 3WAY (MISCELLANEOUS) ×1 IMPLANT
SYR MEDRAD MARK 7 150ML (SYRINGE) ×2 IMPLANT
TRANSDUCER W/STOPCOCK (MISCELLANEOUS) ×2 IMPLANT
TRAY PV CATH (CUSTOM PROCEDURE TRAY) ×2 IMPLANT
TUBING CIL FLEX 10 FLL-RA (TUBING) ×1 IMPLANT
WIRE BENTSON .035X145CM (WIRE) ×1 IMPLANT

## 2018-09-12 NOTE — Telephone Encounter (Signed)
Patient called yesterday, 09/11/2018, to be informed that his angiography procedure would have to be moved to 12:30 with arrival time 10:30 am. He has verbalized his understanding.

## 2018-09-12 NOTE — Interval H&P Note (Signed)
History and Physical Interval Note:  09/12/2018 2:06 PM  [name]  has presented today for surgery, with the diagnosis of pad  The various methods of treatment have been discussed with the patient and family. After consideration of risks, benefits and other options for treatment, the patient has consented to  Procedure(s): ABDOMINAL AORTOGRAM W/LOWER EXTREMITY ([name]) as a surgical intervention .  The patient's history has been reviewed, patient examined, no change in status, stable for surgery.  I have reviewed the patient's chart and labs.  Questions were answered to the patient's satisfaction.     Kathlyn Sacramento

## 2018-09-12 NOTE — Progress Notes (Signed)
No bleeding or hematoma noted after ambulation 

## 2018-09-12 NOTE — Discharge Instructions (Signed)
Femoral Site Care °This sheet gives you information about how to care for yourself after your procedure. Your health care provider may also give you more specific instructions. If you have problems or questions, contact your health care provider. °What can I expect after the procedure? °After the procedure, it is common to have: °· Bruising that usually fades within 1-2 weeks. °· Tenderness at the site. °Follow these instructions at home: °Wound care °· Follow instructions from your health care provider about how to take care of your insertion site. Make sure you: °? Wash your hands with soap and water before you change your bandage (dressing). If soap and water are not available, use hand sanitizer. °? Change your dressing as told by your health care provider. °? Leave stitches (sutures), skin glue, or adhesive strips in place. These skin closures may need to stay in place for 2 weeks or longer. If adhesive strip edges start to loosen and curl up, you may trim the loose edges. Do not remove adhesive strips completely unless your health care provider tells you to do that. °· Do not take baths, swim, or use a hot tub until your health care provider approves. °· You may shower 24-48 hours after the procedure or as told by your health care provider. °? Gently wash the site with plain soap and water. °? Pat the area dry with a clean towel. °? Do not rub the site. This may cause bleeding. °· Do not apply powder or lotion to the site. Keep the site clean and dry. °· Check your femoral site every day for signs of infection. Check for: °? Redness, swelling, or pain. °? Fluid or blood. °? Warmth. °? Pus or a bad smell. °Activity °· For the first 2-3 days after your procedure, or as long as directed: °? Avoid climbing stairs as much as possible. °? Do not squat. °· Do not lift anything that is heavier than 10 lb (4.5 kg), or the limit that you are told, until your health care provider says that it is safe. °· Rest as  directed. °? Avoid sitting for a long time without moving. Get up to take short walks every 1-2 hours. °· Do not drive for 24 hours if you were given a medicine to help you relax (sedative). °General instructions °· Take over-the-counter and prescription medicines only as told by your health care provider. °· Keep all follow-up visits as told by your health care provider. This is important. °Contact a health care provider if you have: °· A fever or chills. °· You have redness, swelling, or pain around your insertion site. °Get help right away if: °· The catheter insertion area swells very fast. °· You pass out. °· You suddenly start to sweat or your skin gets clammy. °· The catheter insertion area is bleeding, and the bleeding does not stop when you hold steady pressure on the area. °· The area near or just beyond the catheter insertion site becomes pale, cool, tingly, or numb. °These symptoms may represent a serious problem that is an emergency. Do not wait to see if the symptoms will go away. Get medical help right away. Call your local emergency services (911 in the [place] Do not drive yourself to the hospital. °Summary °· After the procedure, it is common to have bruising that usually fades within 1-2 weeks. °· Check your femoral site every day for signs of infection. °· Do not lift anything that is heavier than 10 lb (4.5 kg), or the   limit that you are told, until your health care provider says that it is safe. °This information is not intended to replace advice given to you by your health care provider. Make sure you discuss any questions you have with your health care provider. °Document Released: 04/04/2014 Document Revised: 08/14/2017 Document Reviewed: 08/14/2017 °Elsevier Interactive Patient Education © 2019 [employer] ° °

## 2018-09-13 ENCOUNTER — Telehealth: Payer: Self-pay | Admitting: Cardiovascular Disease

## 2018-09-13 ENCOUNTER — Encounter (HOSPITAL_COMMUNITY): Payer: Self-pay | Admitting: Cardiovascular Disease

## 2018-09-13 MED ORDER — PREDNISONE 10 MG PO TABS: ORAL_TABLET | ORAL | 0 refills | Status: DC

## 2018-09-13 NOTE — Telephone Encounter (Signed)
Patient had abdominal aortogram yesterday. Called patient. States last night between 8-9 pm he developed rash on his chest.  He took Benadryl and went to bed. When he woke up this morning, the rash had spread from his chest to neck to upper arms.  It is mildly itching and some burning.  He has taken another Benadryl this morning about 30 minutes ago.  Says he's started drinking more water this morning. Denies difficulty breathing or tightness in his throat. Advised patient to continue to drink plenty of water and ok to continue Benadryl OTC as needed. If continues to please call us back.  Routing to Dr [name] for review and further advice.

## 2018-09-13 NOTE — Telephone Encounter (Signed)
This is likely a delayed contrast reaction.  Please add that to his allergies.  Give him prednisone 40 mg daily for 5 days but he should take 80 mg on the first day.  Continue to use Benadryl as needed for itching.  If he experiences any throat swelling or difficulty breathing, he should immediately go to the emergency room.

## 2018-09-13 NOTE — Telephone Encounter (Signed)
Pt states he had a cath yesterday and has a rash on his arms upper body from chest up to his neck, states it does itch some. States he took some Benadryl last night. Please call to discuss.

## 2018-09-13 NOTE — Telephone Encounter (Signed)
Called patient and he verbalized understanding to take prednisone for 5 days and recommendations. Pt verbalized understanding to call 911 or go to the emergency room, if he develops any new or worsening symptoms. Rx sent to pharmacy.  Contrast allergy added to patient's list.

## 2018-09-20 ENCOUNTER — Other Ambulatory Visit: Payer: Self-pay

## 2018-09-20 DIAGNOSIS — Z96651 Presence of right artificial knee joint: Secondary | ICD-10-CM | POA: Diagnosis not present

## 2018-09-20 DIAGNOSIS — Z471 Aftercare following joint replacement surgery: Secondary | ICD-10-CM | POA: Diagnosis not present

## 2018-09-20 DIAGNOSIS — S8001XD Contusion of right knee, subsequent encounter: Secondary | ICD-10-CM | POA: Diagnosis not present

## 2018-09-20 NOTE — Patient Outreach (Signed)
[facility]) Care Management  09/20/2018  [name] [birth year] [id number]    Medication Adherence call to Mr. [name] patient did not answer  patient is no longer taking  Rosuvastatin 10 mg since 09/22/17,patient fill a prescription  December /2019 for  Simvastatin a different Cholesterol Medication.   [place] [facility] Direct Dial [phone]  Fax [phone] [email or web address]

## 2018-10-03 NOTE — Progress Notes (Signed)
Cardiology Office Note   Date:  10/04/2018   ID:  WEBSTER PATRONE, DOB 1940-09-01, MRN [id number]  PCP:  Ria Bush, MD  Cardiologist:  Dr. Rockey Situ   Chief Complaint  Patient presents with  . Other    4 wk f/u no complaints today. Meds reviewed verbally with pt.      History of Present Illness: [name] is a 78 y.o. male who is here today for a follow-up visit regarding peripheral arterial disease. The patient has known history of coronary artery disease status post CABG in 1990, PCI of the left circumflex in 2002, DVT in the left lower extremity, hyperlipidemia and back surgery for spinal stenosis in 2017.  He had hip replacement x 2. 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  He was seen in early 2019 for peripheral arterial disease. Lower extremity arterial Doppler showed noncompressible arteries on the right side with normal toe brachial index and biphasic waveform.  Left ABI was normal with biphasic waveforms. Lower extremity arterial duplex showed evidence of bilateral SFA disease. His symptoms were felt to be multifactorial due to PAD, arthritis and severe peripheral neuropathy.  He had worsening bilateral leg pain and I proceeded with angiography last month which showed no significant aortoiliac disease.  On the right side, there was moderate to severely calcified SFA with diffuse disease especially in the midsegment and three-vessel runoff below the knee.  On the left, there was borderline significant SFA disease which was also calcified with three-vessel runoff below the knee.  I felt that his symptoms were out of proportion to his angiogram findings.  He reports healing well from the procedure. He is currently waiting on a call from his spine doctor for his constant lower back pain that is exacerbated with movement. Of note, his wife recently had surgery to her shoulder. He has been helping  to take care of her. His daughter is coming to help too. The patient states he recently fell landing on his knee. He fortunately did not break any bones or severely injury himself. He denies chest pain, SOB or any other related symptoms or complaints at this time.    Past Medical History:  Diagnosis Date  . BENIGN PROSTATIC HYPERTROPHY, WITH URINARY OBSTRUCTION 09/06/2007  . CAD s/p CABG    a. 1990 s/p MI-->CABG x 2; b. 2002 s/p BMS to LCX; c. 06/2015 Cath: LM 50ost, LAD 100ost/p, 53m/d, D2 nl, LCX  patent stent, RCA 80p (small), LIMA->LAD nl, VG->D2 nl-->Med Rx.  . Carotid arterial disease ([place])    a. 08/2016 Carotid U/S: <39% bilat.  . 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 Community acquired pneumonia of right lower lobe of lung ([place]) 06/05/2017  . 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 DVT, HX OF 1998  . GERD 04/30/2007  . Headache    hx migraines  . History of hiatal hernia   . History of shingles   . HYPERLIPIDEMIA 04/26/2007  . HYPERTENSION 04/30/2007  . INGUINAL HERNIA, RIGHT 05/26/2010  . Lumbar disc disease with radiculopathy   . OSA (obstructive sleep apnea)    a. did not tolerate CPAP.  Marland Kitchen Osteoarthritis   . PAD (peripheral artery disease) ([place])    a. 07/2017 LE duplex: RSFA 75-64m, LSFA 75-41m, 50-74d.  . Pneumonia 07/2014   [facility] hospitalization  . PSA, INCREASED 07/09/2008  . Spinal stenosis of lumbar region     Past Surgical History:  Procedure  Laterality Date  . ABDOMINAL AORTOGRAM W/LOWER EXTREMITY [name] 09/12/2018   Procedure: ABDOMINAL AORTOGRAM W/LOWER EXTREMITY;  Surgeon: [name], MD;  Location: [place] [facility];  Service: Cardiovascular;  Laterality: [name];  . APPENDECTOMY     rupture  . CARDIAC CATHETERIZATION [name] 07/07/2015   Procedure: Left Heart Cath and Cors/Grafts Angiography;  Surgeon: [name], MD;  Location: [place] [facility];  Service: Cardiovascular;  Laterality: [name];  . CATARACT EXTRACTION, BILATERAL    . CERVICAL SPINE SURGERY   12/2016   cervical stenosis [name])  . COLONOSCOPY  03/2010   HP polyp, diverticulosis, rpt 10 yrs ([name])  . CORONARY ARTERY BYPASS GRAFT  1990   3 vessel   . CYSTOSCOPY  12/23/10   [facility]  . KNEE ARTHROSCOPY Right    x2  . LAMINOTOMY  1986   L5/S1 lumbar laminotomy for two ruptured discs/fusion  . LUMBAR LAMINECTOMY/DECOMPRESSION MICRODISCECTOMY [name] 07/13/2016   Procedure: LUMBAR TWO-THREE, LUMBAR THREE-FOUR, LUMBAR FOUR-FIVE LAMINECTOMY AND FORAMINOTOMY;  Surgeon: [name], MD;  Location: [facility];  Service: Neurosurgery;  Laterality: [name];  LAMINECTOMY AND FORAMINOTOMY L2-L3, L3-L4,L4-L5  . TOTAL HIP ARTHROPLASTY Bilateral 1999,2000  . TOTAL KNEE ARTHROPLASTY Right 03/06/2017   Procedure: RIGHT TOTAL KNEE ARTHROPLASTY;  Surgeon: Gaynelle Arabian, MD;  Location: [facility];  Service: Orthopedics;  Laterality: Right;     Current Outpatient Medications  Medication Sig Dispense Refill  . acetaminophen (TYLENOL) 650 MG CR tablet Take 1,300 mg by mouth every 8 (eight) hours as needed for pain.    Marland Kitchen albuterol (PROVENTIL HFA;VENTOLIN HFA) 108 (90 Base) MCG/ACT inhaler Inhale 2 puffs into the lungs every 6 (six) hours as needed for wheezing or shortness of breath.    Marland Kitchen aspirin EC 81 MG tablet Take 1 tablet (81 mg total) by mouth daily. 90 tablet 3  . docusate sodium (COLACE) 100 MG capsule Take 1 capsule (100 mg total) by mouth 2 (two) times daily. (Patient taking differently: Take 100 mg by mouth daily. ) 60 capsule 0  . ezetimibe (ZETIA) 10 MG tablet TAKE ONE TABLET EVERY DAY (Patient taking differently: Take 10 mg by mouth daily. ) 90 tablet 0  . gabapentin (NEURONTIN) 300 MG capsule Take 300 mg by mouth 3 (three) times daily.    . isosorbide mononitrate (IMDUR) 30 MG 24 hr tablet TAKE ONE TABLET BY MOUTH EVERY DAY (Patient taking differently: Take 30 mg by mouth daily. ) 90 tablet 3  . ketoconazole (NIZORAL) 2 % cream Apply 1 application topically daily as needed for irritation.     .  nitroGLYCERIN (NITROSTAT) 0.4 MG SL tablet Place 1 tablet (0.4 mg total) under the tongue every 5 (five) minutes as needed for chest pain. 25 tablet 6  . predniSONE (DELTASONE) 10 MG tablet Take 8 tablets (80 mg) by mouth today, then 4 tablets (40 mg) by mouth daily for 4 more days. 24 tablet 0  . simvastatin (ZOCOR) 40 MG tablet TAKE ONE TABLET EVERY DAY (Patient taking differently: Take 40 mg by mouth daily. ) 90 tablet 0  . tolterodine (DETROL LA) 4 MG 24 hr capsule Take 4 mg by mouth daily.     No current facility-administered medications for this visit.     Allergies:   Morphine; Vioxx [rofecoxib]; and Contrast media [iodinated diagnostic agents]    Social History:  The patient  reports that he quit smoking about 30 years ago. His smoking use included cigarettes. He has a 25.00 pack-year smoking history. He has  never used smokeless tobacco. He reports that he does not drink alcohol or use drugs.   Family History:  The patient's family history includes Cancer in his sister and another family member; Diabetes in his brother and mother; Heart attack in his mother; Heart disease in his father; Kidney disease in his father and sister; Stroke in his father and mother.    ROS:  Please see the history of present illness.   Otherwise, review of systems are positive for none.   All other systems are reviewed and negative.    PHYSICAL EXAM: VS:  BP 120/64 (BP Location: Left Arm, Patient Position: Sitting, Cuff Size: Normal)   Pulse 83   Ht 6' (1.829 m)   Wt 210 lb 12 oz (95.6 kg)   BMI 28.58 kg/m  , BMI Body mass index is 28.58 kg/m. GEN: Well nourished, well developed, in no acute distress  HEENT: normal  Neck: no JVD, carotid bruits, or masses Cardiac: RRR; no  rubs, or gallops,no edema .  1 out of 6 systolic ejection murmur in the aortic area Respiratory:  clear to auscultation bilaterally, normal work of breathing GI: soft, nontender, nondistended, + BS MS: no deformity or atrophy    Skin: warm and dry, no rash Neuro:  Strength and sensation are intact Psych: euthymic mood, full affect Vascular: Femoral pulses are normal.  Distal pulses are nonpalpable Right groin is intact with no hematoma.  EKG:  EKG is  ordered today. EKG showed normal sinus rhythm with left anterior fascicular block.  Recent Labs: 11/30/2017: ALT 13 08/31/2018: BUN 19; Platelets 181 09/12/2018: Creatinine, Ser 1.20; Hemoglobin 15.0; Potassium 4.5; Sodium 146    Lipid Panel    Component Value Date/Time   CHOL 127 11/30/2017 1035   TRIG 84.0 11/30/2017 1035   HDL 37.50 (L) 11/30/2017 1035   CHOLHDL 3 11/30/2017 1035   VLDL 16.8 11/30/2017 1035   LDLCALC 73 11/30/2017 1035   LDLDIRECT 106.0 01/01/2016 0844      Wt Readings from Last 3 Encounters:  10/04/18 210 lb 12 oz (95.6 kg)  09/12/18 214 lb (97.1 kg)  08/31/18 215 lb (97.5 kg)      No flowsheet data found.    ASSESSMENT AND PLAN:  1.  Peripheral arterial disease: The patient does have bilateral calcified SFA disease.  However, his recent symptoms of severe lower back pain is the biggest issue for him right now.  His back pain is not related to peripheral arterial disease given that there was no significant aortoiliac disease.  The patient is going to follow-up with his spine surgeon for further management. Right now, he does not seem to be having significant calf claudication and I recommend medical therapy for his PAD.  Revascularization with atherectomy and angioplasty intervention can be considered if calf claudication worsens.  2.  Coronary artery disease involving native coronary arteries without angina: Continue medical therapy.  3.  Hyperlipidemia: He is tolerating simvastatin and Zetia.  4.  Possible spinal stenosis: Continue gabapentin.  Continue to follow-up with Dr. [name] to explore the possibility of surgery on his back if needed.  The patient seems to be very frustrated.     Disposition:   FU with me in 6  months  I, Margit Banda am acting as a Education administrator for Kathlyn Sacramento, M.D.  I have reviewed the above documentation for accuracy and completeness, and I agree with the above.   Signed, Kathlyn Sacramento, MD 10/04/18 [place], [place]

## 2018-10-04 ENCOUNTER — Ambulatory Visit: Payer: PPO | Admitting: Cardiovascular Disease

## 2018-10-04 ENCOUNTER — Encounter: Payer: Self-pay | Admitting: Cardiovascular Disease

## 2018-10-04 VITALS — BP 120/64 | HR 83 | Ht 72.0 in | Wt 210.8 lb

## 2018-10-04 DIAGNOSIS — I739 Peripheral vascular disease, unspecified: Secondary | ICD-10-CM

## 2018-10-04 DIAGNOSIS — I2510 Atherosclerotic heart disease of native coronary artery without angina pectoris: Secondary | ICD-10-CM | POA: Diagnosis not present

## 2018-10-04 DIAGNOSIS — E785 Hyperlipidemia, unspecified: Secondary | ICD-10-CM | POA: Diagnosis not present

## 2018-10-04 NOTE — Patient Instructions (Signed)
Medication Instructions:  No changes If you need a refill on your cardiac medications before your next appointment, please call your pharmacy.   Lab work: None ordered  Testing/Procedures: None ordered  Follow-Up: At [facility], you and your health needs are our priority.  As part of our continuing mission to provide you with exceptional heart care, we have created designated Provider Care Teams.  These Care Teams include your primary Cardiologist (physician) and Advanced Practice Providers (APPs -  Physician Assistants and Nurse Practitioners) who all work together to provide you with the care you need, when you need it. You will need a follow up appointment in 6 months.  Please call our office 2 months in advance to schedule this appointment.  You may see Dr. Arida or one of the following Advanced Practice Providers on your designated Care Team:   Christopher Berge, NP Ryan Dunn, PA-C . Jacquelyn Visser, PA-C 

## 2018-10-05 ENCOUNTER — Other Ambulatory Visit: Payer: Self-pay | Admitting: Cardiovascular Disease

## 2018-10-09 DIAGNOSIS — M48061 Spinal stenosis, lumbar region without neurogenic claudication: Secondary | ICD-10-CM | POA: Diagnosis not present

## 2018-10-16 DIAGNOSIS — M48061 Spinal stenosis, lumbar region without neurogenic claudication: Secondary | ICD-10-CM | POA: Diagnosis not present

## 2018-10-17 DIAGNOSIS — M48061 Spinal stenosis, lumbar region without neurogenic claudication: Secondary | ICD-10-CM | POA: Diagnosis not present

## 2018-10-17 DIAGNOSIS — M5127 Other intervertebral disc displacement, lumbosacral region: Secondary | ICD-10-CM | POA: Diagnosis not present

## 2018-10-23 DIAGNOSIS — M4316 Spondylolisthesis, lumbar region: Secondary | ICD-10-CM | POA: Diagnosis not present

## 2018-10-23 DIAGNOSIS — M545 Low back pain: Secondary | ICD-10-CM | POA: Diagnosis not present

## 2018-10-23 DIAGNOSIS — M5416 Radiculopathy, lumbar region: Secondary | ICD-10-CM | POA: Diagnosis not present

## 2018-10-23 DIAGNOSIS — M47816 Spondylosis without myelopathy or radiculopathy, lumbar region: Secondary | ICD-10-CM | POA: Diagnosis not present

## 2018-10-23 DIAGNOSIS — M7138 Other bursal cyst, other site: Secondary | ICD-10-CM | POA: Diagnosis not present

## 2018-10-24 ENCOUNTER — Other Ambulatory Visit: Payer: Self-pay | Admitting: Neurosurgery

## 2018-10-24 ENCOUNTER — Telehealth: Payer: Self-pay | Admitting: Cardiovascular Disease

## 2018-10-24 NOTE — Telephone Encounter (Signed)
° °  [facility] Pre-operative Risk Assessment    Request for surgical clearance:  1. What type of surgery is being performed? Posterior lumbar fusion   2. When is this surgery scheduled? 11/22/2018   3. What type of clearance is required (medical clearance vs. Pharmacy clearance to hold med vs. Both)? both  4. Are there any medications that need to be held prior to surgery and how long? Does patient need to hold ASA, Plavix, Brilinta, Effient, Cilostazol, Zonitivity, Eliquis, Xarelto, Pradaxa, Savaysa or Warfarin/Coumadin and for how many days before.    5. Practice name and name of physician performing surgery? [facility], Dr [name]   6. What is your office phone number [phone] ext [phone]    7.   What is your office fax number [phone] attn: Lexine Baton  8.   Anesthesia type (None, local, MAC, general) ? general   Ace Gins 10/24/2018, 3:24 PM  _________________________________________________________________   (provider comments below)

## 2018-10-24 NOTE — Telephone Encounter (Signed)
Dr. [name]  Can pt hold asa for 5-7 days for spine surgery?  Please respond to CV pre-op thanks

## 2018-10-26 NOTE — Telephone Encounter (Signed)
   Primary Cardiologist: Ida Rogue, MD/ Dr. [name]   Chart reviewed as part of pre-operative protocol coverage. Patient was contacted 10/26/2018 in reference to pre-operative risk assessment for pending surgery as outlined below.  EMILLIANO DILWORTH was last seen on 10/04/18 by Dr. [name].  Since that day, KHRISTOPHER KAPAUN has done well w/o anginal symptoms. Denies exertional CP and dyspnea.   Therefore, based on ACC/AHA guidelines, the patient would be at acceptable risk for the planned procedure without further cardiovascular testing.   Per Dr. [name], ok to hold ASA 5-7 days prior to surgery.   I will route this recommendation to the requesting party via Epic fax function and remove from pre-op pool.  Please call with questions.  Lyda Jester, PA-C 10/26/2018, 1:23 PM

## 2018-10-26 NOTE — Telephone Encounter (Signed)
Okay to hold aspirin 5 to 7 days before surgery.

## 2018-11-13 ENCOUNTER — Inpatient Hospital Stay (HOSPITAL_COMMUNITY): Admission: RE | Admit: 2018-11-13 | Payer: PPO | Source: Ambulatory Visit

## 2018-12-03 ENCOUNTER — Ambulatory Visit (INDEPENDENT_AMBULATORY_CARE_PROVIDER_SITE_OTHER): Payer: PPO

## 2018-12-03 ENCOUNTER — Other Ambulatory Visit: Payer: Self-pay | Admitting: Family Medicine

## 2018-12-03 DIAGNOSIS — I10 Essential (primary) hypertension: Secondary | ICD-10-CM

## 2018-12-03 DIAGNOSIS — Z0000 Encounter for general adult medical examination without abnormal findings: Secondary | ICD-10-CM | POA: Diagnosis not present

## 2018-12-03 DIAGNOSIS — E538 Deficiency of other specified B group vitamins: Secondary | ICD-10-CM

## 2018-12-03 DIAGNOSIS — N138 Other obstructive and reflux uropathy: Secondary | ICD-10-CM

## 2018-12-03 DIAGNOSIS — R7303 Prediabetes: Secondary | ICD-10-CM

## 2018-12-03 DIAGNOSIS — N401 Enlarged prostate with lower urinary tract symptoms: Secondary | ICD-10-CM

## 2018-12-03 DIAGNOSIS — E782 Mixed hyperlipidemia: Secondary | ICD-10-CM

## 2018-12-03 NOTE — Progress Notes (Signed)
PCP notes:   Health maintenance:  Microalbumin - PCP please follow-up  Abnormal screenings:   Fall risk - hx of multiple falls Fall Risk  12/03/2018 11/30/2017 11/27/2017 11/16/2017 10/17/2017  Falls in the past year? 1 No No No No  Number falls in past yr: 1 - - - -  Comment - - - - -  Injury with Fall? 1 - - - -  Comment bruising only - - - -   Patient concerns:   None  Nurse concerns:  None  Next PCP appt:   12/07/18 @ 1030

## 2018-12-03 NOTE — Progress Notes (Signed)
Subjective:   [name] is a 78 y.o. male who presents for Medicare Annual/Subsequent preventive examination.  Review of Systems:  [name] Cardiac Risk Factors include: advanced age (>65men, >67 women);dyslipidemia;hypertension;male gender     Objective:    Vitals: There were no vitals taken for this visit.  There is no height or weight on file to calculate BMI.  Advanced Directives 12/03/2018 09/12/2018 03/02/2018 11/30/2017 10/17/2017 08/22/2017 03/27/2017  Does Patient Have a Medical Advance Directive? No No No No No No No  Does patient want to make changes to medical advance directive? - - - No - Patient declined - - -  Would patient like information on creating a medical advance directive? No - Patient declined No - Patient declined No - Patient declined - No - Patient declined No - Patient declined No - Patient declined    Tobacco Social History   Tobacco Use  Smoking Status Former Smoker  . Packs/day: 1.00  . Years: 25.00  . Pack years: 25.00  . Types: Cigarettes  . Last attempt to quit: 08/15/1988  . Years since quitting: 30.3  Smokeless Tobacco Never Used     Counseling given: No   Clinical Intake:  Pre-visit preparation completed: Yes  Pain : 0-10 Pain Score: 4  Pain Type: Chronic pain Pain Location: Back Pain Onset: More than a month ago Pain Frequency: Constant     Nutritional Risks: None  How often do you need to have someone help you when you read instructions, pamphlets, or other written materials from your doctor or pharmacy?: 1 - Never What is the last grade level you completed in school?: 12th grade  Interpreter Needed?: No  Comments: pt lives with spouse Information entered by :: LPinson, LPN  Past Medical History:  Diagnosis Date  . BENIGN PROSTATIC HYPERTROPHY, WITH URINARY OBSTRUCTION 09/06/2007  . CAD s/p CABG    a. 1990 s/p MI-->CABG x 2; b. 2002 s/p BMS to LCX; c. 06/2015 Cath: LM 50ost, LAD 100ost/p, 77m/d, D2 nl, LCX  patent stent,  RCA 80p (small), LIMA->LAD nl, VG->D2 nl-->Med Rx.  . Carotid arterial disease ([place])    a. 08/2016 Carotid U/S: <39% bilat.  . 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 Community acquired pneumonia of right lower lobe of lung ([place]) 06/05/2017  . 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 DVT, HX OF 1998  . GERD 04/30/2007  . Headache    hx migraines  . History of hiatal hernia   . History of shingles   . HYPERLIPIDEMIA 04/26/2007  . HYPERTENSION 04/30/2007  . INGUINAL HERNIA, RIGHT 05/26/2010  . Lumbar disc disease with radiculopathy   . OSA (obstructive sleep apnea)    a. did not tolerate CPAP.  Marland Kitchen Osteoarthritis   . PAD (peripheral artery disease) ([place])    a. 07/2017 LE duplex: RSFA 75-58m, LSFA 75-7m, 50-74d.  . Pneumonia 07/2014   [facility] hospitalization  . PSA, INCREASED 07/09/2008  . Spinal stenosis of lumbar region    Past Surgical History:  Procedure Laterality Date  . ABDOMINAL AORTOGRAM W/LOWER EXTREMITY [name] 09/12/2018   Procedure: ABDOMINAL AORTOGRAM W/LOWER EXTREMITY;  Surgeon: [name], MD;  Location: [facility];  Service: Cardiovascular;  Laterality: [name];  . APPENDECTOMY     rupture  . CARDIAC CATHETERIZATION [name] 07/07/2015   Procedure: Left Heart Cath and Cors/Grafts Angiography;  Surgeon: [name], MD;  Location: [facility];  Service: Cardiovascular;  Laterality: [name];  . CATARACT EXTRACTION, BILATERAL    .  CERVICAL SPINE SURGERY  12/2016   cervical stenosis [name])  . COLONOSCOPY  03/2010   HP polyp, diverticulosis, rpt 10 yrs ([name])  . CORONARY ARTERY BYPASS GRAFT  1990   3 vessel   . CYSTOSCOPY  12/23/10   Cope  . KNEE ARTHROSCOPY Right    x2  . LAMINOTOMY  1986   L5/S1 lumbar laminotomy for two ruptured discs/fusion  . LUMBAR LAMINECTOMY/DECOMPRESSION MICRODISCECTOMY [name] 07/13/2016   Procedure: LUMBAR TWO-THREE, LUMBAR THREE-FOUR, LUMBAR FOUR-FIVE LAMINECTOMY AND FORAMINOTOMY;  Surgeon: [name], MD;  Location: [facility];   Service: Neurosurgery;  Laterality: [name];  LAMINECTOMY AND FORAMINOTOMY L2-L3, L3-L4,L4-L5  . TOTAL HIP ARTHROPLASTY Bilateral 1999,2000  . TOTAL KNEE ARTHROPLASTY Right 03/06/2017   Procedure: RIGHT TOTAL KNEE ARTHROPLASTY;  Surgeon: Gaynelle Arabian, MD;  Location: [facility];  Service: Orthopedics;  Laterality: Right;   Family History  Problem Relation Age of Onset  . Stroke Mother   . Heart attack Mother   . Diabetes Mother   . Stroke Father   . Heart disease Father   . Kidney disease Father        PCKD  . Cancer Sister        throat  . Diabetes Brother   . Kidney disease Sister        PCKD  . Cancer Other        5/7 nephews with lung cancer   Social History   Socioeconomic History  . Marital status: Married    Spouse name: Not on file  . Number of children: Not on file  . Years of education: Not on file  . Highest education level: Not on file  Occupational History  . Not on file  Social Needs  . Financial resource strain: Not on file  . Food insecurity:    Worry: Not on file    Inability: Not on file  . Transportation needs:    Medical: Not on file    Non-medical: Not on file  Tobacco Use  . Smoking status: Former Smoker    Packs/day: 1.00    Years: 25.00    Pack years: 25.00    Types: Cigarettes    Last attempt to quit: 08/15/1988    Years since quitting: 30.3  . Smokeless tobacco: Never Used  Substance and Sexual Activity  . Alcohol use: No    Alcohol/week: 0.0 standard drinks  . Drug use: No  . Sexual activity: Yes  Lifestyle  . Physical activity:    Days per week: Not on file    Minutes per session: Not on file  . Stress: Not on file  Relationships  . Social connections:    Talks on phone: Not on file    Gets together: Not on file    Attends religious service: Not on file    Active member of club or organization: Not on file    Attends meetings of clubs or organizations: Not on file    Relationship status: Not on file  Other Topics Concern  . Not on  file  Social History Narrative   Married, wife [name], for 25 years in 2016. 1 daughter and 3 grandkids from first marriage.    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    Diet: good water, fruits/vegetables daily    Outpatient Encounter Medications as of 12/03/2018  Medication Sig  .  acetaminophen (TYLENOL) 650 MG CR tablet Take 1,300 mg by mouth every 8 (eight) hours as needed for pain.  Marland Kitchen albuterol (PROVENTIL HFA;VENTOLIN HFA) 108 (90 Base) MCG/ACT inhaler Inhale 2 puffs into the lungs every 6 (six) hours as needed for wheezing or shortness of breath.  Marland Kitchen aspirin EC 81 MG tablet Take 1 tablet (81 mg total) by mouth daily.  Marland Kitchen ezetimibe (ZETIA) 10 MG tablet TAKE ONE TABLET EVERY DAY (Patient taking differently: Take 10 mg by mouth daily. )  . gabapentin (NEURONTIN) 300 MG capsule Take 300 mg by mouth 3 (three) times daily.  . isosorbide mononitrate (IMDUR) 30 MG 24 hr tablet TAKE ONE TABLET BY MOUTH EVERY DAY (Patient taking differently: Take 30 mg by mouth daily. )  . ketoconazole (NIZORAL) 2 % cream Apply 1 application topically daily as needed for irritation.   . nitroGLYCERIN (NITROSTAT) 0.4 MG SL tablet Place 1 tablet (0.4 mg total) under the tongue every 5 (five) minutes as needed for chest pain.  . polyethylene glycol (MIRALAX / GLYCOLAX) 17 g packet Take 17 g by mouth daily.  . simvastatin (ZOCOR) 40 MG tablet TAKE ONE TABLET EVERY DAY (Patient taking differently: Take 40 mg by mouth daily. )  . tolterodine (DETROL LA) 4 MG 24 hr capsule Take 4 mg by mouth daily.  . [DISCONTINUED] docusate sodium (COLACE) 100 MG capsule Take 1 capsule (100 mg total) by mouth 2 (two) times daily. (Patient not taking: Reported on 11/29/2018)  . [DISCONTINUED] predniSONE (DELTASONE) 10 MG tablet Take 8 tablets (80 mg) by mouth today, then 4 tablets (40 mg) by mouth daily  for 4 more days. (Patient not taking: Reported on 11/29/2018)   No facility-administered encounter medications on file as of 12/03/2018.     Activities of Daily Living In your present state of health, do you have any difficulty performing the following activities: 12/03/2018  Hearing? N  Vision? N  Difficulty concentrating or making decisions? N  Walking or climbing stairs? N  Dressing or bathing? N  Doing errands, shopping? N  Preparing Food and eating ? N  Using the Toilet? N  In the past six months, have you accidently leaked urine? N  Do you have problems with loss of bowel control? N  Managing your Medications? N  Managing your Finances? N  Housekeeping or managing your Housekeeping? N  Some recent data might be hidden    Patient Care Team: Ria Bush, MD as PCP - General (Family Medicine) [name], MD as PCP - Cardiology (Cardiology)   Assessment:   This is a routine wellness examination for Saiquan.  Vision Screening Comments: Vision exam in Jan 2020 with Dr. George Ina  Exercise Activities and Dietary recommendations Current Exercise Habits: The patient does not participate in regular exercise at present, Exercise limited by: None identified  Goals    . Follow up with Primary Care Provider     Starting 12/03/18, I will continue to take medications as prescribed and to keep appointments with PCP as scheduled.        Fall Risk Fall Risk  12/03/2018 11/30/2017 11/27/2017 11/16/2017 10/17/2017  Falls in the past year? 1 No No No No  Number falls in past yr: 1 - - - -  Comment - - - - -  Injury with Fall? 1 - - - -  Comment bruising only - - - -   Depression Screen PHQ 2/9 Scores 12/03/2018 11/30/2017 10/17/2017 08/22/2017  PHQ - 2 Score 0 0 0  0  PHQ- 9 Score 0 0 - -    Cognitive Function MMSE - Mini Mental State Exam 12/03/2018 11/30/2017 11/29/2016  Orientation to time 5 5 5   Orientation to Place 5 5 5   Registration 3 3 3   Attention/ Calculation 0 0 0  Recall  3 3 3   Language- name 2 objects 0 0 0  Language- repeat 1 1 1   Language- follow 3 step command 0 3 3  Language- read & follow direction 0 0 0  Write a sentence 0 0 0  Copy design 0 0 0  Total score 17 20 20      PLEASE NOTE: A Mini-Cog screen was completed. Maximum score is 17. A value of 0 denotes this part of [place] MMSE was not completed or the patient failed this part of the Mini-Cog screening.   Mini-Cog Screening Orientation to Time - Max 5 pts Orientation to Place - Max 5 pts Registration - Max 3 pts Recall - Max 3 pts Language Repeat - Max 1 pts      Immunization History  Administered Date(s) Administered  . Influenza Whole 08/15/2000, 07/04/2007, 05/09/2008, 05/31/2010  . Influenza, High Dose Seasonal PF 05/15/2013, 06/25/2015  . Influenza,inj,Quad PF,6+ Mos 04/17/2014, 05/18/2016, 06/07/2018  . Influenza-Unspecified 05/22/2017  . Pneumococcal Conjugate-13 02/09/2015  . Pneumococcal Polysaccharide-23 08/15/2000, 10/29/2008  . Td 08/15/2005  . Tdap 02/10/2018  . Zoster 02/04/2009  . Zoster Recombinat (Shingrix) 04/05/2017, 07/25/2017    Screening Tests Health Maintenance  Topic Date Due  . URINE MICROALBUMIN  12/04/2018 (Originally 12/01/2018)  . INFLUENZA VACCINE  03/16/2019  . DTaP/Tdap/Td (2 - Td) 02/11/2028  . TETANUS/TDAP  02/11/2028  . PNA vac Low Risk Adult  Completed     Plan:     I have personally reviewed, addressed, and noted the following in the patient's chart:  A. Medical and social history B. Use of alcohol, tobacco or illicit drugs  C. Current medications and supplements D. Functional ability and status E.  Nutritional status F.  Physical activity G. Advance directives H. List of other physicians I.  Hospitalizations, surgeries, and ER visits in previous 12 months J.  Vitals (unless it is a telemedicine encounter) K. Screenings to include hearing, vision, cognitive, depression L. Referrals and appointments   In addition, I have  reviewed and discussed with patient certain preventive protocols, quality metrics, and best practice recommendations. A written personalized care plan for preventive services and recommendations were provided to patient.  With patient's permission, we connected on 12/03/18 at  1:00 PM EDT by a video enabled telemedicine application. Two patient identifiers were used to ensure the encounter occurred with the correct person. .   Patient was in home and writer was in office.   Signed,   Lindell Noe, MHA, BS, LPN Health Coach

## 2018-12-03 NOTE — Patient Instructions (Signed)
[name] , Thank you for taking time to come for your Medicare Wellness Visit. I appreciate your ongoing commitment to your health goals. Please review the following plan we discussed and let me know if I can assist you in the future.   These are the goals we discussed: Goals    . Follow up with Primary Care Provider     Starting 12/03/18, I will continue to take medications as prescribed and to keep appointments with PCP as scheduled.        This is a list of the screening recommended for you and due dates:  Health Maintenance  Topic Date Due  . Urine Protein Check  12/04/2018*  . Flu Shot  03/16/2019  . DTaP/Tdap/Td vaccine (2 - Td) 02/11/2028  . Tetanus Vaccine  02/11/2028  . Pneumonia vaccines  Completed  *Topic was postponed. The date shown is not the original due date.   Preventive Care for Adults  A healthy lifestyle and preventive care can promote health and wellness. Preventive health guidelines for adults include the following key practices.  . A routine yearly physical is a good way to check with your health care provider about your health and preventive screening. It is a chance to share any concerns and updates on your health and to receive a thorough exam.  . Visit your dentist for a routine exam and preventive care every 6 months. Brush your teeth twice a day and floss once a day. Good oral hygiene prevents tooth decay and gum disease.  . The frequency of eye exams is based on your age, health, family medical history, use  of contact lenses, and other factors. Follow your health care provider's recommendations for frequency of eye exams.  . Eat a healthy diet. Foods like vegetables, fruits, whole grains, low-fat dairy products, and lean protein foods contain the nutrients you need without too many calories. Decrease your intake of foods high in solid fats, added sugars, and salt. Eat the right amount of calories for you. Get information about a proper diet from  your health care provider, if necessary.  . Regular physical exercise is one of the most important things you can do for your health. Most adults should get at least 150 minutes of moderate-intensity exercise (any activity that increases your heart rate and causes you to sweat) each week. In addition, most adults need muscle-strengthening exercises on 2 or more days a week.  Silver Sneakers may be a benefit available to you. To determine eligibility, you may visit the website: www.silversneakers.com or contact program at 631-548-8607 Mon-Fri between 8AM-8PM.   . Maintain a healthy weight. The body mass index (BMI) is a screening tool to identify possible weight problems. It provides an estimate of body fat based on height and weight. Your health care provider can find your BMI and can help you achieve or maintain a healthy weight.   For adults 20 years and older: ? A BMI below 18.5 is considered underweight. ? A BMI of 18.5 to 24.9 is normal. ? A BMI of 25 to 29.9 is considered overweight. ? A BMI of 30 and above is considered obese.   . Maintain normal blood lipids and cholesterol levels by exercising and minimizing your intake of saturated fat. Eat a balanced diet with plenty of fruit and vegetables. Blood tests for lipids and cholesterol should begin at age 76 and be repeated every 5 years. If your lipid or cholesterol levels are high, you are over 50, or you  are at high risk for heart disease, you may need your cholesterol levels checked more frequently. Ongoing high lipid and cholesterol levels should be treated with medicines if diet and exercise are not working.  . If you smoke, find out from your health care provider how to quit. If you do not use tobacco, please do not start.  . If you choose to drink alcohol, please do not consume more than 2 drinks per day. One drink is considered to be 12 ounces (355 mL) of beer, 5 ounces (148 mL) of wine, or 1.5 ounces (44 mL) of liquor.  . If you  are 29-33 years old, ask your health care provider if you should take aspirin to prevent strokes.  . Use sunscreen. Apply sunscreen liberally and repeatedly throughout the day. You should seek shade when your shadow is shorter than you. Protect yourself by wearing long sleeves, pants, a wide-brimmed hat, and sunglasses year round, whenever you are outdoors.  . Once a month, do a whole body skin exam, using a mirror to look at the skin on your back. Tell your health care provider of new moles, moles that have irregular borders, moles that are larger than a pencil eraser, or moles that have changed in shape or color.

## 2018-12-04 ENCOUNTER — Other Ambulatory Visit (INDEPENDENT_AMBULATORY_CARE_PROVIDER_SITE_OTHER): Payer: PPO

## 2018-12-04 ENCOUNTER — Other Ambulatory Visit: Payer: Self-pay

## 2018-12-04 DIAGNOSIS — I10 Essential (primary) hypertension: Secondary | ICD-10-CM | POA: Diagnosis not present

## 2018-12-04 DIAGNOSIS — N138 Other obstructive and reflux uropathy: Secondary | ICD-10-CM | POA: Diagnosis not present

## 2018-12-04 DIAGNOSIS — N401 Enlarged prostate with lower urinary tract symptoms: Secondary | ICD-10-CM | POA: Diagnosis not present

## 2018-12-04 DIAGNOSIS — R7303 Prediabetes: Secondary | ICD-10-CM | POA: Diagnosis not present

## 2018-12-04 DIAGNOSIS — E782 Mixed hyperlipidemia: Secondary | ICD-10-CM

## 2018-12-04 DIAGNOSIS — E538 Deficiency of other specified B group vitamins: Secondary | ICD-10-CM | POA: Diagnosis not present

## 2018-12-04 LAB — PSA: PSA: 0.51 ng/mL (ref 0.10–4.00)

## 2018-12-04 LAB — COMPREHENSIVE METABOLIC PANEL
ALT: 16 U/L (ref 0–53)
AST: 18 U/L (ref 0–37)
Albumin: 4.4 g/dL (ref 3.5–5.2)
Alkaline Phosphatase: 36 U/L — ABNORMAL LOW (ref 39–117)
BUN: 24 mg/dL — ABNORMAL HIGH (ref 6–23)
CO2: 22 mEq/L (ref 19–32)
Calcium: 9.6 mg/dL (ref 8.4–10.5)
Chloride: 104 mEq/L (ref 96–112)
Creatinine, Ser: 1.14 mg/dL (ref 0.40–1.50)
GFR: 62.18 mL/min (ref >60.00)
Glucose, Bld: 101 mg/dL — ABNORMAL HIGH (ref 70–99)
Potassium: 4.4 mEq/L (ref 3.5–5.1)
Sodium: 140 mEq/L (ref 135–145)
Total Bilirubin: 0.6 mg/dL (ref 0.2–1.2)
Total Protein: 6.8 g/dL (ref 6.0–8.3)

## 2018-12-04 LAB — LIPID PANEL
Cholesterol: 123 mg/dL (ref 0–200)
HDL: 33.6 mg/dL — ABNORMAL LOW (ref >39.00)
LDL Cholesterol: 67 mg/dL (ref 0–99)
NonHDL: 89.89
Total CHOL/HDL Ratio: 4
Triglycerides: 116 mg/dL (ref 0.0–149.0)
VLDL: 23.2 mg/dL (ref 0.0–40.0)

## 2018-12-04 LAB — HEMOGLOBIN A1C: Hgb A1c MFr Bld: 6.4 % (ref 4.6–6.5)

## 2018-12-04 LAB — MICROALBUMIN / CREATININE URINE RATIO
Creatinine,U: 48.9 mg/dL
Microalb Creat Ratio: 1.4 mg/g (ref 0.0–30.0)
Microalb, Ur: <0.7 mg/dL (ref 0.0–1.9)

## 2018-12-04 LAB — VITAMIN B12: Vitamin B-12: 316 pg/mL (ref 211–911)

## 2018-12-04 NOTE — Progress Notes (Signed)
I reviewed health advisor's note, was available for consultation, and agree with documentation and plan.  

## 2018-12-06 ENCOUNTER — Ambulatory Visit: Payer: PPO

## 2018-12-07 ENCOUNTER — Other Ambulatory Visit: Payer: Self-pay

## 2018-12-07 ENCOUNTER — Ambulatory Visit (INDEPENDENT_AMBULATORY_CARE_PROVIDER_SITE_OTHER): Payer: PPO | Admitting: Family Medicine

## 2018-12-07 ENCOUNTER — Encounter: Payer: Self-pay | Admitting: Family Medicine

## 2018-12-07 ENCOUNTER — Other Ambulatory Visit (INDEPENDENT_AMBULATORY_CARE_PROVIDER_SITE_OTHER): Payer: PPO

## 2018-12-07 VITALS — BP 139/68 | HR 74 | Temp 98.0°F | Ht 73.0 in | Wt 208.0 lb

## 2018-12-07 DIAGNOSIS — R7989 Other specified abnormal findings of blood chemistry: Secondary | ICD-10-CM

## 2018-12-07 DIAGNOSIS — I10 Essential (primary) hypertension: Secondary | ICD-10-CM

## 2018-12-07 DIAGNOSIS — M7989 Other specified soft tissue disorders: Secondary | ICD-10-CM

## 2018-12-07 DIAGNOSIS — I25810 Atherosclerosis of coronary artery bypass graft(s) without angina pectoris: Secondary | ICD-10-CM

## 2018-12-07 DIAGNOSIS — M5116 Intervertebral disc disorders with radiculopathy, lumbar region: Secondary | ICD-10-CM

## 2018-12-07 DIAGNOSIS — E538 Deficiency of other specified B group vitamins: Secondary | ICD-10-CM | POA: Diagnosis not present

## 2018-12-07 DIAGNOSIS — N401 Enlarged prostate with lower urinary tract symptoms: Secondary | ICD-10-CM

## 2018-12-07 DIAGNOSIS — N138 Other obstructive and reflux uropathy: Secondary | ICD-10-CM | POA: Diagnosis not present

## 2018-12-07 DIAGNOSIS — I739 Peripheral vascular disease, unspecified: Secondary | ICD-10-CM

## 2018-12-07 DIAGNOSIS — E782 Mixed hyperlipidemia: Secondary | ICD-10-CM

## 2018-12-07 DIAGNOSIS — M48062 Spinal stenosis, lumbar region with neurogenic claudication: Secondary | ICD-10-CM

## 2018-12-07 DIAGNOSIS — R7303 Prediabetes: Secondary | ICD-10-CM

## 2018-12-07 LAB — CBC WITH DIFFERENTIAL/PLATELET
Basophils Absolute: 0.1 10*3/uL (ref 0.0–0.1)
Basophils Relative: 1.5 % (ref 0.0–3.0)
Eosinophils Absolute: 0.3 10*3/uL (ref 0.0–0.7)
Eosinophils Relative: 5.4 % — ABNORMAL HIGH (ref 0.0–5.0)
HCT: 42.6 % (ref 39.0–52.0)
Hemoglobin: 14.5 g/dL (ref 13.0–17.0)
Lymphocytes Relative: 29.2 % (ref 12.0–46.0)
Lymphs Abs: 1.4 10*3/uL (ref 0.7–4.0)
MCHC: 34.2 g/dL (ref 30.0–36.0)
MCV: 93.6 fl (ref 78.0–100.0)
Monocytes Absolute: 0.4 10*3/uL (ref 0.1–1.0)
Monocytes Relative: 9.2 % (ref 3.0–12.0)
Neutro Abs: 2.6 10*3/uL (ref 1.4–7.7)
Neutrophils Relative %: 54.7 % (ref 43.0–77.0)
Platelets: 196 10*3/uL (ref 150.0–400.0)
RBC: 4.54 Mil/uL (ref 4.22–5.81)
RDW: 12.8 % (ref 11.5–15.5)
WBC: 4.7 10*3/uL (ref 4.0–10.5)

## 2018-12-07 LAB — SEDIMENTATION RATE: Sed Rate: 6 mm/hr (ref 0–20)

## 2018-12-07 MED ORDER — B-12 1000 MCG SL SUBL: 1.0000 | SUBLINGUAL_TABLET | Freq: Every day | SUBLINGUAL | Status: DC

## 2018-12-07 MED ORDER — NITROGLYCERIN 0.4 MG SL SUBL: 0.4000 mg | SUBLINGUAL_TABLET | SUBLINGUAL | 3 refills | Status: DC | PRN

## 2018-12-07 NOTE — Assessment & Plan Note (Signed)
Sees urology regularly. Latest PSA reassuring.

## 2018-12-07 NOTE — Assessment & Plan Note (Addendum)
Bilateral hand swelling with pain in known OA, however endorsing worse at R 3rd MCP. Will further evaluate for inflammatory arthritis with rpt labwork this afternoon - ordered. Denies leg swelling.

## 2018-12-07 NOTE — Assessment & Plan Note (Signed)
Appreciate cards care of patient. Continue aspirin.

## 2018-12-07 NOTE — Assessment & Plan Note (Signed)
Deteriorated control after quarantine due to coronavirus pandemic. Encouraged continue to follow diabetic diet.

## 2018-12-07 NOTE — Assessment & Plan Note (Addendum)
Levels dropping off replacement - rec restart vit b12 1000 mcg daily.

## 2018-12-07 NOTE — Assessment & Plan Note (Addendum)
Chronic, stable. Continue current regimen. HLD low. The ASCVD Risk score Mikey Bussing DC Jr., et al., 2013) failed to calculate for the following reasons:   The valid total cholesterol range is 130 to 320 mg/dL

## 2018-12-07 NOTE — Progress Notes (Signed)
Virtual visit completed through Doxy.Me. Due to national recommendations of social distancing due to [place] 19, a virtual visit is felt to be most appropriate for this patient at this time.   Patient location: home Provider location: [place] at [facility], office If any vitals were documented, they were collected by patient at home unless specified below.    BP 139/68 (BP Location: Left Arm, Patient Position: Sitting, Cuff Size: Normal)   Pulse 74   Temp 98 F (36.7 C) (Oral)   Ht 6\' 1"  (1.854 m)   Wt 208 lb (94.3 kg)   BMI 27.44 kg/m    CC: AMW f/u visit Subjective:    Patient ID: [name], male    DOB: 01/26/1941, 78 y.o.   MRN: [id number]  HPI: [name] is a 78 y.o. male presenting on 12/07/2018 for Annual Exam (Pt 2. )   Saw Katha Cabal Monday for medicare wellness visit. Note reviewed.   H/o lumbar laminectomy, R knee replacement, cervical spine surgery over last few years. 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   Prediabetes - stable. Needs to watch carbs.  PAD - sees Dr [name]. Known B SFA disease.  He has had several falls recently latest a few weeks ago (fell down steps, missed lower step).   Noticing progressive hand swelling at night time for the past month, some hand stiffness and chronic hand pain presumed from OA. Unable to straighten fingers on either hand now, worse pain and swelling at 3rd MCPJ on right. No redness or warmth. Sees Dr Onnie Graham for known shoulder osteoarthritis, considering surgery after back surgery.   Preventative: Colon cancer screening - colonoscopy 03/2010 HP polyp, diverticulosis, rpt 10 yrs [facility])  Prostate cancer screening - sees Dr Karsten Ro yearly  Lung cancer screening - not eligible  Flu shot - yearly  Td 2007  Pneumovax 10/2008, prevnar 01/2015 [id number].  shingrix - 2018 completed series Advanced directive discussion -needs updating. Wife is HCPOA. Has  packet at home.  Seat belt use discussed  Sunscreen use discussed. No changing moles on skin. Sees derm Nehemiah Massed).  Ex smoker - quit 1990 Alcohol - none Normal bowel movement.  Normal urination - on Detrol LA.   Married, wife [name], for 25 years in 2016. 1 daughter and 3 grandkids from first marriage. 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  Diet: good water, fruits/vegetables daily      Relevant past medical, surgical, family and social history reviewed and updated as indicated. Interim medical history since our last visit reviewed. Allergies and medications reviewed and updated. Outpatient Medications Prior to Visit  Medication Sig Dispense Refill  . acetaminophen (TYLENOL) 650 MG CR tablet Take 1,300 mg by mouth every 8 (eight) hours as needed for pain.    Marland Kitchen albuterol (PROVENTIL HFA;VENTOLIN HFA) 108 (90 Base) MCG/ACT inhaler Inhale 2 puffs into the lungs every 6 (six) hours as needed for wheezing or shortness of breath.    Marland Kitchen aspirin EC 81 MG tablet Take 1 tablet (81 mg total) by mouth daily. 90 tablet 3  . ezetimibe (ZETIA) 10 MG tablet TAKE ONE TABLET EVERY DAY (Patient taking differently: Take 10 mg by mouth daily. ) 90 tablet 0  . gabapentin (NEURONTIN) 300 MG capsule Take 300 mg by mouth 3 (three) times daily.    . isosorbide  mononitrate (IMDUR) 30 MG 24 hr tablet TAKE ONE TABLET BY MOUTH EVERY DAY (Patient taking differently: Take 30 mg by mouth daily. ) 90 tablet 3  . ketoconazole (NIZORAL) 2 % cream Apply 1 application topically daily as needed for irritation.     . polyethylene glycol (MIRALAX / GLYCOLAX) 17 g packet Take 17 g by mouth daily.    . simvastatin (ZOCOR) 40 MG tablet TAKE ONE TABLET EVERY DAY (Patient taking differently: Take 40 mg by mouth daily. ) 90 tablet 0  . tolterodine (DETROL LA) 4 MG 24 hr capsule Take 4 mg by mouth  daily.    . nitroGLYCERIN (NITROSTAT) 0.4 MG SL tablet Place 1 tablet (0.4 mg total) under the tongue every 5 (five) minutes as needed for chest pain. 25 tablet 6   No facility-administered medications prior to visit.      Per HPI unless specifically indicated in ROS section below Review of Systems Objective:    BP 139/68 (BP Location: Left Arm, Patient Position: Sitting, Cuff Size: Normal)   Pulse 74   Temp 98 F (36.7 C) (Oral)   Ht 6\' 1"  (1.854 m)   Wt 208 lb (94.3 kg)   BMI 27.44 kg/m   Wt Readings from Last 3 Encounters:  12/07/18 208 lb (94.3 kg)  10/04/18 210 lb 12 oz (95.6 kg)  09/12/18 214 lb (97.1 kg)     Physical exam: Gen: alert, NAD, not ill appearing Pulm: speaks in complete sentences without increased work of breathing Psych: normal mood, normal thought content      Results for orders placed or performed in visit on 12/04/18  Microalbumin / creatinine urine ratio  Result Value Ref Range   Microalb, Ur <0.7 0.0 - 1.9 mg/dL   Creatinine,U 48.9 mg/dL   Microalb Creat Ratio 1.4 0.0 - 30.0 mg/g  Vitamin B12  Result Value Ref Range   Vitamin B-12 316 211 - 911 pg/mL  PSA  Result Value Ref Range   PSA 0.51 0.10 - 4.00 ng/mL  Hemoglobin A1c  Result Value Ref Range   Hgb A1c MFr Bld 6.4 4.6 - 6.5 %  Lipid panel  Result Value Ref Range   Cholesterol 123 0 - 200 mg/dL   Triglycerides 116.0 0.0 - 149.0 mg/dL   HDL 33.60 (L) >39.00 mg/dL   VLDL 23.2 0.0 - 40.0 mg/dL   LDL Cholesterol 67 0 - 99 mg/dL   Total CHOL/HDL Ratio 4    NonHDL 89.89   Comprehensive metabolic panel  Result Value Ref Range   Sodium 140 135 - 145 mEq/L   Potassium 4.4 3.5 - 5.1 mEq/L   Chloride 104 96 - 112 mEq/L   CO2 22 19 - 32 mEq/L   Glucose, Bld 101 (H) 70 - 99 mg/dL   BUN 24 (H) 6 - 23 mg/dL   Creatinine, Ser 1.14 0.40 - 1.50 mg/dL   Total Bilirubin 0.6 0.2 - 1.2 mg/dL   Alkaline Phosphatase 36 (L) 39 - 117 U/L   AST 18 0 - 37 U/L   ALT 16 0 - 53 U/L   Total Protein 6.8  6.0 - 8.3 g/dL   Albumin 4.4 3.5 - 5.2 g/dL   Calcium 9.6 8.4 - 10.5 mg/dL   GFR 62.18 >60.00 mL/min   Assessment & Plan:   Problem List Items Addressed This Visit    PVD (peripheral vascular disease) with claudication ([place])    Appreciate cards care of patient. Continue aspirin.  Relevant Medications   nitroGLYCERIN (NITROSTAT) 0.4 MG SL tablet   Prediabetes    Deteriorated control after quarantine due to coronavirus pandemic. Encouraged continue to follow diabetic diet.       Lumbar stenosis with neurogenic claudication   Lumbar disc disease with radiculopathy   Low vitamin B12 level    Levels dropping off replacement - rec restart vit b12 1000 mcg daily.       Hyperlipidemia    Chronic, stable. Continue current regimen. HLD low. The ASCVD Risk score Mikey Bussing DC Jr., et al., 2013) failed to calculate for the following reasons:   The valid total cholesterol range is 130 to 320 mg/dL       Relevant Medications   nitroGLYCERIN (NITROSTAT) 0.4 MG SL tablet   Hand swelling - Primary    Bilateral hand swelling with pain in known OA, however endorsing worse at R 3rd MCP. Will further evaluate for inflammatory arthritis with rpt labwork this afternoon - ordered. Denies leg swelling.       Relevant Orders   Rheumatoid factor   Sedimentation rate   CBC with Differential/Platelet   Essential hypertension    Chronic, stable. Continue current regimen of imdur. H/o white coat hypertension.       Relevant Medications   nitroGLYCERIN (NITROSTAT) 0.4 MG SL tablet   CAD, ARTERY BYPASS GRAFT   Benign prostatic hyperplasia with urinary obstruction    Sees urology regularly. Latest PSA reassuring.           Meds ordered this encounter  Medications  . nitroGLYCERIN (NITROSTAT) 0.4 MG SL tablet    Sig: Place 1 tablet (0.4 mg total) under the tongue every 5 (five) minutes as needed for chest pain.    Dispense:  25 tablet    Refill:  3  . Cyanocobalamin (B-12) 1000 MCG SUBL     Sig: Place 1 tablet under the tongue daily.    Dispense:  30 each   Orders Placed This Encounter  Procedures  . Rheumatoid factor    Standing Status:   Future    Standing Expiration Date:   12/07/2019  . Sedimentation rate    Standing Status:   Future    Standing Expiration Date:   12/07/2019  . CBC with Differential/Platelet    Standing Status:   Future    Standing Expiration Date:   12/07/2019    Follow up plan: Return in about 6 months (around 06/08/2019) for follow up visit.  Ria Bush, MD

## 2018-12-07 NOTE — Addendum Note (Signed)
Addended by: Cloyd Stagers on: 12/07/2018 02:50 PM   Modules accepted: Orders

## 2018-12-07 NOTE — Assessment & Plan Note (Signed)
Chronic, stable. Continue current regimen of imdur. H/o white coat hypertension.

## 2018-12-08 LAB — RHEUMATOID FACTOR: Rheumatoid fact SerPl-aCnc: <14 IU/mL (ref <14)

## 2018-12-13 ENCOUNTER — Inpatient Hospital Stay (HOSPITAL_COMMUNITY): Admission: RE | Admit: 2018-12-13 | Payer: PPO | Source: Ambulatory Visit

## 2019-01-03 ENCOUNTER — Other Ambulatory Visit: Payer: Self-pay | Admitting: Cardiovascular Disease

## 2019-01-28 DIAGNOSIS — D1800 Hemangioma unspecified site: Secondary | ICD-10-CM | POA: Diagnosis not present

## 2019-01-28 DIAGNOSIS — D692 Other nonthrombocytopenic purpura: Secondary | ICD-10-CM | POA: Diagnosis not present

## 2019-01-28 DIAGNOSIS — L814 Other melanin hyperpigmentation: Secondary | ICD-10-CM | POA: Diagnosis not present

## 2019-01-28 DIAGNOSIS — L578 Other skin changes due to chronic exposure to nonionizing radiation: Secondary | ICD-10-CM | POA: Diagnosis not present

## 2019-01-28 DIAGNOSIS — L219 Seborrheic dermatitis, unspecified: Secondary | ICD-10-CM | POA: Diagnosis not present

## 2019-01-28 DIAGNOSIS — L821 Other seborrheic keratosis: Secondary | ICD-10-CM | POA: Diagnosis not present

## 2019-01-28 DIAGNOSIS — Z1283 Encounter for screening for malignant neoplasm of skin: Secondary | ICD-10-CM | POA: Diagnosis not present

## 2019-01-28 DIAGNOSIS — D229 Melanocytic nevi, unspecified: Secondary | ICD-10-CM | POA: Diagnosis not present

## 2019-02-19 ENCOUNTER — Ambulatory Visit: Payer: PPO | Admitting: Cardiovascular Disease

## 2019-02-19 ENCOUNTER — Telehealth: Payer: Self-pay | Admitting: Cardiovascular Disease

## 2019-02-19 NOTE — Telephone Encounter (Signed)

## 2019-02-19 NOTE — Progress Notes (Signed)
Cardiology Office Note  Date:  02/20/2019   ID:  [name], DOB Jan 03, 1941, MRN [id number]  PCP:  [name] Bush, MD   Chief Complaint  Patient presents with  . other    Patient needs surgical clearance for 05/13/2019. Patient c/o SOB and swelling in arms. Meds reviewed verbally with patient.     HPI:  78 year old gentleman with hx of OSA, did not tolerate CPAP mild  bilateral carotid arterial disease  <39% bilaterally 08/2016 hyperlipidemia  coronary artery disease,  bypass in 1990,  stenting to the circumflex in 2002,  Last cath 06/2015, patent grafts x 2, RCA small vessel diffuse disease DVT in the left lower extremity,  Completed back surgery for spinal stenosis, 06/20/2016 Cervical neck surgery 01/2017, [name]   total knee surgery in 02/2017 who presents for routine followup Of his coronary artery disease.   Needs back surgery [name] Reports he is scheduled this for September 2020  He has had a history of chronic shortness of breath, some atypical chest discomfort On today's visit reports Some mild SOB walking to barn and back Slight chest burning, not consistent, chronic issue Has been there for months, possibly years Come on with PNA, years ago  BP stable, plays golf   bilateral SFA disease, Periodic follow-up with Dr. Fletcher [name] Prior angiography Symptoms complicated by diffuse arthritis, back pain  Lab work reviewed with him in detail HBA1C 6.4  Lab Results  Component Value Date   CHOL 123 12/04/2018   HDL 33.60 (L) 12/04/2018   [name] 67 12/04/2018   TRIG 116.0 12/04/2018   EKG personally reviewed by myself on todays visit shows normal sinus rhythm with rate 71 bpm, left axis deviation, no significant ST or T wave change  Other past medical history AAA less than 3 cm, on scan in 2018 reviewed with him Numerous previous episode of chest pain relieved with belching consistent with GI etiology.   Previous cardiac catheterization  07/07/2015  Ost LM to LM lesion, 50% stenosed.  Ost RCA lesion, 80% stenosed.  Prox RCA-1 lesion, 80% stenosed.  Prox RCA-2 lesion, 80% stenosed.  LIMA was injected is moderate in size, and is anatomically normal.  Dist LAD lesion, 60% stenosed.  Ost LAD to Prox LAD lesion, 100% stenosed.  Mid LAD lesion, 60% stenosed.  The left ventricular systolic function is normal.  SVG was injected is normal in caliber, and is anatomically normal.  2nd Diag lesion, 50% stenosed.  severe RCA disease (small vessel) not amenable to PCI, moderate left main disease, moderate LAD and proximal diagonal disease. --Patent grafts x2 --No intervention performed, started on isosorbide 30 mg daily  went to the emergency room in [facility] 07/03/2013 with chest pain. Had a CT scan of his chest which was essentially benign. Nitroglycerin did not help his pain. He ruled out and was sent home.  Repeat episode of chest pain 08/05/2013 with stress test at that time showing no significant ischemia, ejection fraction 48%. It was felt his chest pain was atypical in nature. She feels it was from GI etiology given his negative stress test.  History of cold induced chest tightness/shortness of breath, asthma.   episode of chest pain 05/14/2013. He felt was indigestion. He took several nitroglycerin with no significant improvement in his symptoms. Symptoms were severe, felt similar to his previous MI. He got in his car and on the way, had a big burp. Symptoms seemed to resolve at that time. He continued on to the emergency room.  There cardiac enzymes were negative x2, EKG unchanged. He reports having had prior chest pain episode relieved with belching in the past.   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  PSH:    Past Surgical History:  Procedure Laterality Date  . ABDOMINAL AORTOGRAM W/LOWER EXTREMITY [name] 09/12/2018   Procedure: ABDOMINAL AORTOGRAM W/LOWER EXTREMITY;  Surgeon: [name] Hampshire, MD;  Location: [place] [facility];  Service: Cardiovascular;  Laterality: [name];  . APPENDECTOMY     rupture  . CARDIAC CATHETERIZATION [name] 07/07/2015   Procedure: Left Heart Cath and Cors/Grafts Angiography;  Surgeon: [name] Merritts, MD;  Location: [place] [facility];  Service: Cardiovascular;  Laterality: [name];  . CATARACT EXTRACTION, BILATERAL    . CERVICAL SPINE SURGERY  12/2016   cervical stenosis [name])  . COLONOSCOPY  03/2010   HP polyp, diverticulosis, rpt 10 yrs ([name])  . CORONARY ARTERY BYPASS GRAFT  1990   3 vessel   . CYSTOSCOPY  12/23/10   [name]  . KNEE ARTHROSCOPY Right    x2  . LAMINOTOMY  1986   L5/S1 lumbar laminotomy for two ruptured discs/fusion  . LUMBAR LAMINECTOMY/DECOMPRESSION MICRODISCECTOMY [name] 07/13/2016   Procedure: LUMBAR TWO-THREE, LUMBAR THREE-FOUR, LUMBAR FOUR-FIVE LAMINECTOMY AND FORAMINOTOMY;  Surgeon: [name] Pies, MD;  Location: [place] [facility];  Service: Neurosurgery;  Laterality: [name];  LAMINECTOMY AND FORAMINOTOMY L2-L3, L3-L4,L4-L5  . TOTAL HIP ARTHROPLASTY Bilateral 1999,2000  . TOTAL KNEE ARTHROPLASTY Right 03/06/2017   Procedure: RIGHT TOTAL KNEE ARTHROPLASTY;   Surgeon: [name] Arabian, MD;  Location: [facility];  Service: Orthopedics;  Laterality: Right;    Current Outpatient Medications  Medication Sig Dispense Refill  . acetaminophen (TYLENOL) 650 MG CR tablet Take 1,300 mg by mouth every 8 (eight) hours as needed for pain.    Marland Kitchen albuterol (PROVENTIL HFA;VENTOLIN HFA) 108 (90 Base) MCG/ACT inhaler Inhale 2 puffs into the lungs every 6 (six) hours as needed for wheezing or shortness of breath.    Marland Kitchen aspirin EC 81 MG tablet Take 1 tablet (81 mg total) by mouth daily. 90 tablet 3  . Cyanocobalamin (B-12) 1000 MCG SUBL Place 1 tablet under the tongue daily. 30 each   . ezetimibe (ZETIA) 10 MG tablet TAKE ONE TABLET BY MOUTH EVERY DAY 90 tablet 0  . gabapentin (NEURONTIN) 300 MG capsule Take 300 mg by mouth 3 (three) times daily.    . isosorbide mononitrate (IMDUR) 30 MG 24 hr tablet TAKE ONE TABLET BY MOUTH EVERY DAY (Patient taking differently: Take 30 mg by mouth daily. ) 90 tablet 3  . ketoconazole (NIZORAL) 2 % cream Apply 1 application topically daily as needed for irritation.     . nitroGLYCERIN (NITROSTAT) 0.4 MG SL tablet Place 1 tablet (0.4 mg total) under the tongue every 5 (five) minutes as needed for chest pain. 25 tablet 3  . polyethylene glycol (MIRALAX / GLYCOLAX) 17 g packet Take 17 g by mouth daily.    . simvastatin (ZOCOR) 40 MG tablet  TAKE ONE TABLET EVERY DAY 90 tablet 0  . tolterodine (DETROL LA) 4 MG 24 hr capsule Take 4 mg by mouth daily.     No current facility-administered medications for this visit.      Allergies:   Morphine, Vioxx [rofecoxib], and Contrast media [iodinated diagnostic agents]   Social History:  The patient  reports that he quit smoking about 30 years ago. His smoking use included cigarettes. He has a 25.00 pack-year smoking history. He has never used smokeless tobacco. He reports that he does not drink alcohol or use drugs.   Family History:   family history includes Cancer in his sister and another family  member; Diabetes in his brother and mother; Heart attack in his mother; Heart disease in his father; Kidney disease in his father and sister; Stroke in his father and mother.    Review of Systems: Review of Systems  Constitutional: Negative.   HENT: Negative.   Respiratory: Positive for shortness of breath.   Cardiovascular: Negative.   Gastrointestinal: Negative.   Musculoskeletal: Positive for back pain and joint pain.  Neurological: Negative.   Psychiatric/Behavioral: Negative.   All other systems reviewed and are negative.    PHYSICAL EXAM: VS:  BP 138/64 (BP Location: Left Arm, Patient Position: Sitting, Cuff Size: Normal)   Pulse 71   Ht 6\' 1"  (1.854 m)   Wt 210 lb (95.3 kg)   BMI 27.71 kg/m  , BMI Body mass index is 27.71 kg/m. Constitutional:  oriented to person, place, and time. No distress.  HENT:  Head: Normocephalic and atraumatic.  Eyes:  no discharge. No scleral icterus.  Neck: Normal range of motion. Neck supple. No JVD present.  Cardiovascular: Normal rate, regular rhythm, normal heart sounds and intact distal pulses. Exam reveals no gallop and no friction rub. No edema No murmur heard. Pulmonary/Chest: Effort normal and breath sounds normal. No stridor. No respiratory distress.  no wheezes.  no rales.  no tenderness.  Abdominal: Soft.  no distension.  no tenderness.  Musculoskeletal: Normal range of motion.  no  tenderness or deformity.  Neurological:  normal muscle tone. Coordination normal. No atrophy Skin: Skin is warm and dry. No rash noted. not diaphoretic.  Psychiatric:  normal mood and affect. behavior is normal. Thought content normal.    Recent Labs: 12/04/2018: ALT 16; BUN 24; Creatinine, Ser 1.14; Potassium 4.4; Sodium 140 12/07/2018: Hemoglobin 14.5; Platelets 196.0    Lipid Panel Lab Results  Component Value Date   CHOL 123 12/04/2018   HDL 33.60 (L) 12/04/2018   LDLCALC 67 12/04/2018   TRIG 116.0 12/04/2018      Wt Readings from Last  3 Encounters:  02/20/19 210 lb (95.3 kg)  12/07/18 208 lb (94.3 kg)  10/04/18 210 lb 12 oz (95.6 kg)      ASSESSMENT AND PLAN:  Preop cardiovascular visit Scheduled for back surgery [name] September 2020 No further testing needed at this time He does have some stable chronic angina, we are increasing his isosorbide up to 30 twice daily He will call us back if symptoms get worse between now and September  Mixed hyperlipidemia Numbers at goal, no changes made  Essential hypertension Blood pressure elevated on today's visit He will check numbers suggested we can call our office Prior office visits reviewed with other providers and typically 175 systolic He is in pain today, knees legs  Atherosclerosis of coronary artery bypass graft of native heart with stable angina pectoris ([place]) Some atypical symptoms, occasional  burning, some chronic shortness of breath been going on for months to years We did discuss doing a stress test, he prefers to increase the isosorbide up to 30 mg twice daily He is still active at baseline though limited by his orthopedic issues  Bilateral carotid artery stenosis Mild bilateral carotid disease less than 39% bilaterally Diabetes numbers well controlled, cholesterol numbers controlled  Diet-controlled diabetes mellitus ([place]) Numbers are stable Weight stable  Ex-smoker Nonsmoker  Obesity, Class I, BMI 30-34.9 Weight stable and hemoglobin A1c improved  Knee also arthritis, s/p TKR Doing better  PAD Prior NGO with Dr. Fletcher [name] ,  bilateral SFA disease   Disposition:   F/U  12 months   Total encounter time more than 25 minutes  Greater than 50% was spent in counseling and coordination of care with the patient    Orders Placed This Encounter  Procedures  . EKG 12-Lead     Signed, Esmond Plants, M.D., Ph.D. 02/20/2019  [place], [place]

## 2019-02-20 ENCOUNTER — Other Ambulatory Visit: Payer: Self-pay

## 2019-02-20 ENCOUNTER — Ambulatory Visit: Payer: PPO | Admitting: Cardiovascular Disease

## 2019-02-20 ENCOUNTER — Encounter: Payer: Self-pay | Admitting: Cardiovascular Disease

## 2019-02-20 VITALS — BP 138/64 | HR 71 | Ht 73.0 in | Wt 210.0 lb

## 2019-02-20 DIAGNOSIS — I6523 Occlusion and stenosis of bilateral carotid arteries: Secondary | ICD-10-CM

## 2019-02-20 DIAGNOSIS — E785 Hyperlipidemia, unspecified: Secondary | ICD-10-CM

## 2019-02-20 DIAGNOSIS — I739 Peripheral vascular disease, unspecified: Secondary | ICD-10-CM | POA: Diagnosis not present

## 2019-02-20 DIAGNOSIS — I10 Essential (primary) hypertension: Secondary | ICD-10-CM

## 2019-02-20 DIAGNOSIS — I25118 Atherosclerotic heart disease of native coronary artery with other forms of angina pectoris: Secondary | ICD-10-CM

## 2019-02-20 MED ORDER — ISOSORBIDE MONONITRATE ER 30 MG PO TB24: 30.0000 mg | ORAL_TABLET | Freq: Two times a day (BID) | ORAL | 3 refills | Status: DC

## 2019-02-20 NOTE — Patient Instructions (Addendum)
Medication Instructions:  Your physician has recommended you make the following change in your medication:  1. INCREASE Isosorbide mononitrate to 30 mg twice a day.  If you need a refill on your cardiac medications before your next appointment, please call your pharmacy.    Lab work: No new labs needed   If you have labs (blood work) drawn today and your tests are completely normal, you will receive your results only by: Marland Kitchen MyChart Message (if you have MyChart) OR . A paper copy in the mail If you have any lab test that is abnormal or we need to change your treatment, we will call you to review the results.   Testing/Procedures: No new testing needed   Follow-Up: At [facility], you and your health needs are our priority.  As part of our continuing mission to provide you with exceptional heart care, we have created designated Provider Care Teams.  These Care Teams include your primary Cardiologist (physician) and Advanced Practice Providers (APPs -  Physician Assistants and Nurse Practitioners) who all work together to provide you with the care you need, when you need it.  . You will need a follow up appointment in 6 months .   Please call our office 2 months in advance to schedule this appointment.    . Providers on your designated Care Team:   . Murray Hodgkins, NP . Christell Faith, PA-C . Marrianne Mood, PA-C  Any Other Special Instructions Will Be Listed Below (If Applicable).  For educational health videos Log in to : [email or web address] Or : [email or web address], password : [email or web address]

## 2019-03-15 DIAGNOSIS — I25810 Atherosclerosis of coronary artery bypass graft(s) without angina pectoris: Secondary | ICD-10-CM

## 2019-03-15 DIAGNOSIS — Z01810 Encounter for preprocedural cardiovascular examination: Secondary | ICD-10-CM

## 2019-03-19 ENCOUNTER — Telehealth: Payer: Self-pay | Admitting: Cardiovascular Disease

## 2019-03-19 NOTE — Telephone Encounter (Signed)
Called patient and he verbalized understanding of the follow instructions. Sent to MyChart as well.   [place]  Your caregiver has ordered a Stress Test with nuclear imaging. The purpose of this test is to evaluate the blood supply to your heart muscle. This procedure is referred to as a "Non-Invasive Stress Test." This is because other than having an IV started in your vein, nothing is inserted or "invades" your body. Cardiac stress tests are done to find areas of poor blood flow to the heart by determining the extent of coronary artery disease (CAD). Some patients exercise on a treadmill, which naturally increases the blood flow to your heart, while others who are  unable to walk on a treadmill due to physical limitations have a pharmacologic/chemical stress agent called Lexiscan . This medicine will mimic walking on a treadmill by temporarily increasing your coronary blood flow.   Please note: these test may take anywhere between 2-4 hours to complete  PLEASE REPORT TO [facility] AT THE FIRST DESK WILL DIRECT YOU WHERE TO GO  Date of Procedure:_____8/12/2020___________  Arrival Time for Procedure:_____08:45 am_____________   PLEASE NOTIFY THE OFFICE AT LEAST 24 HOURS IN ADVANCE IF YOU ARE UNABLE TO KEEP YOUR APPOINTMENT.  [phone] AND  PLEASE NOTIFY NUCLEAR MEDICINE AT [facility] AT LEAST 24 HOURS IN ADVANCE IF YOU ARE UNABLE TO KEEP YOUR APPOINTMENT. [phone]  How to prepare for your Myoview test:  1. Do not eat or drink after midnight 2. No caffeine for 24 hours prior to test 3. No smoking 24 hours prior to test. 4. Your medication may be taken with water.  If your doctor stopped a medication because of this test, do not take that medication. 5. Ladies, please do not wear dresses.  Skirts or pants are appropriate. Please wear a short sleeve shirt. 6. No perfume, cologne or lotion. 7. Wear comfortable walking shoes. No  heels!

## 2019-03-19 NOTE — Telephone Encounter (Signed)
Scheduled 8/12 lexi please call to discuss details and instructions

## 2019-03-27 ENCOUNTER — Ambulatory Visit
Admission: RE | Admit: 2019-03-27 | Discharge: 2019-03-27 | Disposition: A | Discharge status: Home / Self Care | Payer: PPO | Source: Ambulatory Visit | Attending: Cardiovascular Disease | Admitting: Cardiovascular Disease

## 2019-03-27 ENCOUNTER — Other Ambulatory Visit: Payer: Self-pay

## 2019-03-27 DIAGNOSIS — Z01810 Encounter for preprocedural cardiovascular examination: Secondary | ICD-10-CM | POA: Diagnosis not present

## 2019-03-27 DIAGNOSIS — I25810 Atherosclerosis of coronary artery bypass graft(s) without angina pectoris: Secondary | ICD-10-CM | POA: Insufficient documentation

## 2019-03-27 LAB — NM MYOCAR MULTI W/SPECT W/WALL MOTION / EF
Estimated workload: 1 METS
Exercise duration (min): 0 min
Exercise duration (sec): 0 s
LV dias vol: 95 mL (ref 62–150)
LV sys vol: 44 mL
MPHR: 143 {beats}/min
Peak HR: 98 {beats}/min
Percent HR: 68 %
Rest HR: 59 {beats}/min
SDS: 3
SRS: 3
SSS: 2
TID: 1.18

## 2019-03-27 MED ORDER — REGADENOSON 0.4 MG/5ML IV SOLN
0.4000 mg | Freq: Once | INTRAVENOUS | Status: AC
  Administered 2019-03-27: 11:00:00 0.4 mg via INTRAVENOUS

## 2019-03-27 MED ORDER — TECHNETIUM TC 99M TETROFOSMIN IV KIT
10.9800 | PACK | Freq: Once | INTRAVENOUS | Status: AC | PRN
  Administered 2019-03-27: 10.98 via INTRAVENOUS

## 2019-03-27 MED ORDER — TECHNETIUM TC 99M TETROFOSMIN IV KIT
30.6560 | PACK | Freq: Once | INTRAVENOUS | Status: AC | PRN
  Administered 2019-03-27: 30.656 via INTRAVENOUS

## 2019-04-02 ENCOUNTER — Telehealth: Payer: Self-pay | Admitting: Cardiovascular Disease

## 2019-04-02 ENCOUNTER — Other Ambulatory Visit: Payer: Self-pay | Admitting: Cardiovascular Disease

## 2019-04-02 NOTE — Telephone Encounter (Signed)
Results of myoview from 03/27/19 called to patient.  Can walk 300-400 feet and then he gets short of breath and burning in his chest.  Even with mild exertion, he experiences the SOB and burning.  Office visit on 7/8, Dr Rockey Situ increased [place] which did not help so Carlton Adam was ordered. Patient would like Dr Donivan Scull advice on what he should do next.  He wonders if it was a lung issue?

## 2019-04-02 NOTE — Telephone Encounter (Signed)
Symptoms consistent with stable angina though unable to exclude a lung issue  Could try Ranexa 500 mg twice daily for 1 week then up to 1000 twice daily Unclear if there is more room on his blood pressure to increase the isosorbide  4 lungs He could potentially try albuterol inhaler before he walks to see if this helps his symptoms

## 2019-04-02 NOTE — Telephone Encounter (Signed)
° ° °  Please call with stress test results °

## 2019-04-03 MED ORDER — RANOLAZINE ER 500 MG PO TB12: 500.0000 mg | ORAL_TABLET | Freq: Two times a day (BID) | ORAL | 0 refills | Status: DC

## 2019-04-03 MED ORDER — RANOLAZINE ER 1000 MG PO TB12: 1000.0000 mg | ORAL_TABLET | Freq: Two times a day (BID) | ORAL | 11 refills | Status: DC

## 2019-04-03 NOTE — Telephone Encounter (Signed)
Spoke with patient at length about previous call and recommendations by provider. He was agreeable with plan. Advised that in some cases they do require prior authorization and further insurance review but we will complete necessary paperwork as required. Instructed him to please give [place] a call if he should have any questions. He was appreciative for the call with no further questions.

## 2019-04-10 DIAGNOSIS — L570 Actinic keratosis: Secondary | ICD-10-CM | POA: Diagnosis not present

## 2019-04-10 DIAGNOSIS — D692 Other nonthrombocytopenic purpura: Secondary | ICD-10-CM | POA: Diagnosis not present

## 2019-04-10 DIAGNOSIS — L578 Other skin changes due to chronic exposure to nonionizing radiation: Secondary | ICD-10-CM | POA: Diagnosis not present

## 2019-04-11 DIAGNOSIS — N4340 Spermatocele of epididymis, unspecified: Secondary | ICD-10-CM | POA: Diagnosis not present

## 2019-04-11 DIAGNOSIS — R3915 Urgency of urination: Secondary | ICD-10-CM | POA: Diagnosis not present

## 2019-04-11 DIAGNOSIS — N401 Enlarged prostate with lower urinary tract symptoms: Secondary | ICD-10-CM | POA: Diagnosis not present

## 2019-04-15 ENCOUNTER — Telehealth: Payer: Self-pay | Admitting: Cardiovascular Disease

## 2019-04-15 NOTE — Telephone Encounter (Signed)
Patient taking Imdur BID and Ranexa 500 mg BID. He feels this is going well and would prefer to stay current dose rather than prescribed increase.   If advised, pt would like Ranexa 500 mg BID refills sent to pharmacy.   Routed to Dr. Rockey Situ to advise.

## 2019-04-15 NOTE — Telephone Encounter (Signed)
Please call regarding Ranexa . States it is working fine, just needs to discuss the mg.

## 2019-04-16 NOTE — Telephone Encounter (Signed)
Patient calling in for an update and would like to be advised on what to do

## 2019-04-16 NOTE — Telephone Encounter (Signed)
Stay on current doses if working for him

## 2019-04-17 ENCOUNTER — Ambulatory Visit (INDEPENDENT_AMBULATORY_CARE_PROVIDER_SITE_OTHER): Payer: PPO

## 2019-04-17 DIAGNOSIS — Z23 Encounter for immunization: Secondary | ICD-10-CM

## 2019-04-17 MED ORDER — RANOLAZINE ER 500 MG PO TB12: 500.0000 mg | ORAL_TABLET | Freq: Two times a day (BID) | ORAL | 11 refills | Status: DC

## 2019-04-17 NOTE — Telephone Encounter (Signed)
returned call to patient with recommendations from Dr. Rockey Situ. Pt verbalized understanding and medication changes updated in chart.   Advised pt to call for any further questions or concerns.

## 2019-05-08 DIAGNOSIS — L570 Actinic keratosis: Secondary | ICD-10-CM | POA: Diagnosis not present

## 2019-05-08 DIAGNOSIS — L578 Other skin changes due to chronic exposure to nonionizing radiation: Secondary | ICD-10-CM | POA: Diagnosis not present

## 2019-05-14 NOTE — Progress Notes (Signed)
[facility] - [place], [place] - [address] [place] [address] Phone: [phone] Fax: [phone]    Your procedure is scheduled on Wednesday, October 7th.  Report to [facility] Main Entrance "A" at 6:30 A.M., and check in at the Admitting office.  Call this number if you have problems the morning of surgery:  [phone]  Call [phone] if you have any questions prior to your surgery date Monday-Friday 8am-4pm   Remember:  Do not eat or drink after midnight the night before your surgery   Take these medicines the morning of surgery with A SIP OF WATER  ezetimibe (ZETIA) gabapentin (NEURONTIN)  isosorbide mononitrate (IMDUR)  simvastatin (ZOCOR)  If needed - acetaminophen (TYLENOL),  albuterol (PROVENTIL HFA;VENTOLIN HFA)/inhaler: bring with you the day of surgery,   nitroGLYCERIN (NITROSTAT),   Follow your surgeon's instructions on when to stop Aspirin.  If no instructions were given by your surgeon then you will need to call the office to get those instructions.    7 days prior to surgery STOP taking any Aspirin (unless otherwise instructed by your surgeon), Aleve, Naproxen, Ibuprofen, Motrin, Advil, Goody's, BC's, all herbal medications, fish oil, and all vitamins.  The Morning of Surgery  Do not wear jewelry, make-up or nail polish.  Do not wear lotions, powders, or perfumes/colognes, or deodorant  Do not shave 48 hours prior to surgery.  Men may shave face and neck.  Do not bring valuables to the hospital.  [facility] is not responsible for any belongings or valuables.  If you are a smoker, DO NOT Smoke 24 hours prior to surgery IF you wear a CPAP at night please bring your mask, tubing, and machine the morning of surgery   Remember that you must have someone to transport you home after your surgery, and remain with you for 24 hours if you are discharged the same day.  Contacts, glasses, hearing aids, dentures or bridgework may not  be worn into surgery.   Leave your suitcase in the car.  After surgery it may be brought to your room.  For patients admitted to the hospital, discharge time will be determined by your treatment team.  Patients discharged the day of surgery will not be allowed to drive home.   Special instructions:   [facility]- Preparing For Surgery  Before surgery, you can play an important role. Because skin is not sterile, your skin needs to be as free of germs as possible. You can reduce the number of germs on your skin by washing with CHG (chlorahexidine gluconate) Soap before surgery.  CHG is an antiseptic cleaner which kills germs and bonds with the skin to continue killing germs even after washing.    Oral Hygiene is also important to reduce your risk of infection.  Remember - BRUSH YOUR TEETH THE MORNING OF SURGERY WITH YOUR REGULAR TOOTHPASTE  Please do not use if you have an allergy to CHG or antibacterial soaps. If your skin becomes reddened/irritated stop using the CHG.  Do not shave (including legs and underarms) for at least 48 hours prior to first CHG shower. It is OK to shave your face.  Please follow these instructions carefully.   1. Shower the NIGHT BEFORE SURGERY and the MORNING OF SURGERY with CHG Soap.   2. If you chose to wash your hair, wash your hair first as usual with your normal shampoo.  3. After you shampoo, rinse your hair and body thoroughly to remove  the shampoo.  4. Use CHG as you would any other liquid soap. You can apply CHG directly to the skin and wash gently with a scrungie or a clean washcloth.   5. Apply the CHG Soap to your body ONLY FROM THE NECK DOWN.  Do not use on open wounds or open sores. Avoid contact with your eyes, ears, mouth and genitals (private parts). Wash Face and genitals (private parts)  with your normal soap.   6. Wash thoroughly, paying special attention to the area where your surgery will be performed.  7. Thoroughly rinse your body with  warm water from the neck down.  8. DO NOT shower/wash with your normal soap after using and rinsing off the CHG Soap.  9. Pat yourself dry with a CLEAN TOWEL.  10. Wear CLEAN PAJAMAS to bed the night before surgery, wear comfortable clothes the morning of surgery  11. Place CLEAN SHEETS on your bed the night of your first shower and DO NOT SLEEP WITH PETS.  Day of Surgery:  Do not apply any deodorants/lotions. Please shower the morning of surgery with the CHG soap  Please wear clean clothes to the hospital/surgery center.   Remember to brush your teeth WITH YOUR REGULAR TOOTHPASTE.  Please read over the following fact sheets that you were given.

## 2019-05-15 ENCOUNTER — Encounter (HOSPITAL_COMMUNITY): Payer: Self-pay

## 2019-05-15 ENCOUNTER — Encounter (HOSPITAL_COMMUNITY)
Admission: RE | Admit: 2019-05-15 | Discharge: 2019-05-15 | Disposition: A | Discharge status: Home / Self Care | Payer: PPO | Source: Ambulatory Visit | Attending: Neurosurgery | Admitting: Neurosurgery

## 2019-05-15 ENCOUNTER — Other Ambulatory Visit: Payer: Self-pay

## 2019-05-15 DIAGNOSIS — Z87891 Personal history of nicotine dependence: Secondary | ICD-10-CM | POA: Insufficient documentation

## 2019-05-15 DIAGNOSIS — Z96643 Presence of artificial hip joint, bilateral: Secondary | ICD-10-CM | POA: Diagnosis not present

## 2019-05-15 DIAGNOSIS — Z951 Presence of aortocoronary bypass graft: Secondary | ICD-10-CM | POA: Insufficient documentation

## 2019-05-15 DIAGNOSIS — N138 Other obstructive and reflux uropathy: Secondary | ICD-10-CM | POA: Diagnosis not present

## 2019-05-15 DIAGNOSIS — N401 Enlarged prostate with lower urinary tract symptoms: Secondary | ICD-10-CM | POA: Diagnosis not present

## 2019-05-15 DIAGNOSIS — Z01812 Encounter for preprocedural laboratory examination: Secondary | ICD-10-CM | POA: Diagnosis not present

## 2019-05-15 DIAGNOSIS — I739 Peripheral vascular disease, unspecified: Secondary | ICD-10-CM | POA: Diagnosis not present

## 2019-05-15 DIAGNOSIS — Z7982 Long term (current) use of aspirin: Secondary | ICD-10-CM | POA: Insufficient documentation

## 2019-05-15 DIAGNOSIS — R7303 Prediabetes: Secondary | ICD-10-CM | POA: Insufficient documentation

## 2019-05-15 DIAGNOSIS — I252 Old myocardial infarction: Secondary | ICD-10-CM | POA: Insufficient documentation

## 2019-05-15 DIAGNOSIS — M48061 Spinal stenosis, lumbar region without neurogenic claudication: Secondary | ICD-10-CM | POA: Insufficient documentation

## 2019-05-15 DIAGNOSIS — Z79899 Other long term (current) drug therapy: Secondary | ICD-10-CM | POA: Diagnosis not present

## 2019-05-15 DIAGNOSIS — G4733 Obstructive sleep apnea (adult) (pediatric): Secondary | ICD-10-CM | POA: Insufficient documentation

## 2019-05-15 DIAGNOSIS — Z96651 Presence of right artificial knee joint: Secondary | ICD-10-CM | POA: Diagnosis not present

## 2019-05-15 DIAGNOSIS — Z86718 Personal history of other venous thrombosis and embolism: Secondary | ICD-10-CM | POA: Diagnosis not present

## 2019-05-15 DIAGNOSIS — E785 Hyperlipidemia, unspecified: Secondary | ICD-10-CM | POA: Insufficient documentation

## 2019-05-15 DIAGNOSIS — I25810 Atherosclerosis of coronary artery bypass graft(s) without angina pectoris: Secondary | ICD-10-CM | POA: Insufficient documentation

## 2019-05-15 DIAGNOSIS — M4316 Spondylolisthesis, lumbar region: Secondary | ICD-10-CM | POA: Insufficient documentation

## 2019-05-15 HISTORY — DX: Prediabetes: R73.03

## 2019-05-15 LAB — TYPE AND SCREEN
ABO/RH(D): A NEG
Antibody Screen: NEGATIVE

## 2019-05-15 LAB — BASIC METABOLIC PANEL
Anion gap: 11 (ref 5–15)
BUN: 25 mg/dL — ABNORMAL HIGH (ref 8–23)
CO2: 25 mmol/L (ref 22–32)
Calcium: 9.4 mg/dL (ref 8.9–10.3)
Chloride: 104 mmol/L (ref 98–111)
Creatinine, Ser: 1.23 mg/dL (ref 0.61–1.24)
GFR calc Af Amer: >60 mL/min (ref >60)
GFR calc non Af Amer: 56 mL/min — ABNORMAL LOW (ref >60)
Glucose, Bld: 109 mg/dL — ABNORMAL HIGH (ref 70–99)
Potassium: 4.2 mmol/L (ref 3.5–5.1)
Sodium: 140 mmol/L (ref 135–145)

## 2019-05-15 LAB — CBC
HCT: 45.4 % (ref 39.0–52.0)
Hemoglobin: 14.6 g/dL (ref 13.0–17.0)
MCH: 32.1 pg (ref 26.0–34.0)
MCHC: 32.2 g/dL (ref 30.0–36.0)
MCV: 99.8 fL (ref 80.0–100.0)
Platelets: 166 10*3/uL (ref 150–400)
RBC: 4.55 MIL/uL (ref 4.22–5.81)
RDW: 12.2 % (ref 11.5–15.5)
WBC: 4.4 10*3/uL (ref 4.0–10.5)
nRBC: 0 % (ref 0.0–0.2)

## 2019-05-15 LAB — SURGICAL PCR SCREEN
MRSA, PCR: NEGATIVE
Staphylococcus aureus: NEGATIVE

## 2019-05-15 LAB — ABO/RH: ABO/RH(D): A NEG

## 2019-05-15 NOTE — Progress Notes (Signed)
PCP - Ria Bush, MD Cardiologist - Ida Rogue, MD  PPM/ICD - Denies Device Orders - [name] Rep Notified [name]  Chest x-ray - [name] EKG - 02/20/2019 Stress Test - 03/27/2019 ECHO - Denies Cardiac Cath - 07/07/15  Sleep Study - Yes, but patient cannot remember when or where CPAP -   Fasting Blood Sugar - [name] Checks Blood Sugar __[name]__ times a day  Blood Thinner Instructions: [name] Aspirin Instructions: Per patient, instructed by his doctor to stop taking ASA 10 days prior to surgery. Last dose taken 05/12/2019  ERAS Protcol - [name] PRE-SURGERY Ensure - [name]  COVID TEST- 05/20/2019   Anesthesia review: Yes, cardiac hx  Patient denies shortness of breath, fever, cough and chest pain at PAT appointment   Coronavirus Screening  Have you experienced the following symptoms:  Cough yes/no: No Fever (>100.41F)  yes/no: No Runny nose yes/no: No Sore throat yes/no: No Difficulty breathing/shortness of breath  yes/no: No  Have you or a family member traveled in the last 14 days and where? yes/no: No   If the patient indicates "YES" to the above questions, their PAT will be rescheduled to limit the exposure to others and, the surgeon will be notified. THE PATIENT WILL NEED TO BE ASYMPTOMATIC FOR 14 DAYS.   If the patient is not experiencing any of these symptoms, the PAT nurse will instruct them to NOT bring anyone with them to their appointment since they may have these symptoms or traveled as well.   Please remind your patients and families that hospital visitation restrictions are in effect and the importance of the restrictions.     Patient verbalized understanding of instructions that were given to them at the PAT appointment. Patient was also instructed that they will need to review over the PAT instructions again at home before surgery.

## 2019-05-16 ENCOUNTER — Encounter (HOSPITAL_COMMUNITY): Payer: Self-pay | Admitting: Vascular Surgery

## 2019-05-16 NOTE — Progress Notes (Signed)
Anesthesia Chart Review:  Case: [id number] Date/Time: 05/22/19 0815   Procedure: POSTERIOR LUMBAR INTERBODY FUSION,LUMBAR 4- LUMBAR 5,  POSTERIOR INSTRUMENTATION ([name] ) - POSTERIOR LUMBAR INTERBODY FUSION,LUMBAR 4- LUMBAR 5,  POSTERIOR INSTRUMENTATION   Anesthesia type: General   Pre-op diagnosis: SPONDYLOLISTHESIS, LUMBAR REGION   Location: [facility] OR ROOM 19 / [place] OR   Surgeon: [name], MD      DISCUSSION: Patient is a 78 year old male scheduled for the above procedure.  History includes former smoker (quit 1990), CAD ([place], s/p CABG 1990, LIMA-LAD, SVG-D1-[place] Int); MI s/p BMS LCX 11/30/00; patent grafts 2016 LHC), pre-diabetes, OSA (intolerant to CPAP), chronic prostatitis/BPH, hiatal hernia, carotid artery disease (1-39%, 2018), PAD, LLE DVT (1998), back surgery (s/p L2-5 laminectomy/laminotomy/foraminotomy 07/13/16), c-spine surgery (2018).  He was seen by cardiologist Dr. Rockey Situ on 02/20/19 for follow-up and preoperative evaluation for back surgery. He noted patient with history of chronic SOB and stable chronic angina (likely due to small vessel disease, see 2016 LHC). Imdur increased, but otherwise did not recommend additional preoperative testing; however, no significant improvement with increased Imdur, so he ultimately was started on Ranexa and scheduled for a stress test which was non-ischemic, EF 38%. Following review of test results, Dr. Rockey Situ did not recommend invasive testing such as cardiac cath.  Last aspirin 05/12/2019. COVID-19 test is scheduled for 05/20/19. If results negative and otherwise no acute changes then it is anticipated that he can proceed as planned. Discussed case with anesthesiologist Annye Asa, MD.   VS: BP (!) 143/67   Pulse 85   Temp (!) 36.4 C   Resp 20   Ht 6' (1.829 m)   Wt 95.3 kg   SpO2 97%   BMI 28.49 kg/m    PROVIDERS: Ria Bush, MD his PCP Ida Rogue, MD is cardiologist  LABS: Labs reviewed: Acceptable for surgery.  A1c 6.4% 12/04/18. (all labs ordered are listed, but only abnormal results are displayed)  Labs Reviewed  BASIC METABOLIC PANEL - Abnormal; Notable for the following components:      Result Value   Glucose, Bld 109 (*)    BUN 25 (*)    GFR calc non Af Amer 56 (*)    All other components within normal limits  SURGICAL PCR SCREEN  CBC  TYPE AND SCREEN  ABO/RH    EKG: 02/20/19 ([facility]): NSR LAD Septal infarct (age undetermined)   CV: Nuclear stress test 03/27/19: Pharmacological myocardial perfusion imaging study with no significant ischemia Small region mild fixed perfusion defect in the distal anteroseptal and apical region, unable to exclude old scar vs attenuation artifact EF estimated at 38%, septal wall hypokinesis, possibly secondary to CABG/post-operative state.  No EKG changes concerning for ischemia at peak stress or in recovery. Low risk scan - EF previously 55-60% by 07/07/15 LHC, 48% by 08/01/13 nuclear stress test - Per Dr. Rockey Situ 03/31/19:  Stress test with no ischemia,  No indication at this time for invasive study such as cardiac cath   Abdominal Aortogram with LE runoff 09/12/18 Kathlyn Sacramento, MD): 1.  No significant aortoiliac disease. 2.  Right lower extremity: Moderately to severely calcified SFA with diffuse disease throughout its course especially in the midsegment and three-vessel runoff below the knee. 3.  Left lower extremity: Borderline significant SFA disease which is moderately to severely calcified and three-vessel runoff below the knee. Recommendations: The patient's peripheral arterial disease does not explain his degree of claudication and rest pain which I suspect is likely due to other etiology such  as spinal stenosis.  He complains of worse symptoms on the left side than the right side although his disease is worse on the right side.  Both SFAs can be treated with atherectomy if needed in the future.   Carotid US  09/09/16: Impressions: Heterogeneous plaque, bilaterally. 1 to 39% bilateral ICA stenosis. Patent vertebral arteries with antegrade flow. Normal subclavian arteries, bilaterally.   Cardiac cath 07/07/15: Conclusion:  Ost LM to LM lesion, 50% stenosed.  Ost RCA lesion, 80% stenosed.  Prox RCA-1 lesion, 80% stenosed.  Prox RCA-2 lesion, 80% stenosed.  LIMA was injected is moderate in size, and is anatomically normal.  Dist LAD lesion, 60% stenosed.  Ost LAD to Prox LAD lesion, 100% stenosed.  Mid LAD lesion, 60% stenosed.  The left ventricular systolic function is normal.  SVG was injected is normal in caliber, and is anatomically normal.  2nd Diag lesion, 50% stenosed.  LVEF is estimated at 55-65%. Final Conclusions:   Angina likely from diffuse disease, severe RCA disease (small vessel) not amenable to PCI, moderate left main disease, moderate LAD and proximal diagonal disease.  --Patent grafts x2  --No intervention indicated, medical management.  Recommendations:  Etiology of his chest pain could be secondary to small vessel disease.  Unable to exclude GI etiology.  Could try ranexa for symptoms.    Past Medical History:  Diagnosis Date  . BENIGN PROSTATIC HYPERTROPHY, WITH URINARY OBSTRUCTION 09/06/2007  . CAD s/p CABG    a. 1990 s/p MI-->CABG x 2; b. 2002 s/p BMS to LCX; c. 06/2015 Cath: LM 50ost, LAD 100ost/p, 71m/d, D2 nl, LCX  patent stent, RCA 80p (small), LIMA->LAD nl, VG->D2 nl-->Med Rx.  . Carotid arterial disease ([place])    a. 08/2016 Carotid U/S: <39% bilat.  . 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 Community acquired pneumonia of right lower lobe of lung ([place]) 06/05/2017  . 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 DVT, HX OF 1998  . GERD 04/30/2007  . Headache    hx migraines  . History of hiatal hernia   . History of shingles   . HYPERLIPIDEMIA 04/26/2007  . HYPERTENSION 04/30/2007  . INGUINAL HERNIA, RIGHT 05/26/2010  . Laceration of skin of left  hand 03/08/2018  . Lumbar disc disease with radiculopathy   . Myocardial infarction ([place])    1989, 2002  . OSA (obstructive sleep apnea)    a. did not tolerate CPAP.  Marland Kitchen Osteoarthritis   . PAD (peripheral artery disease) ([place])    a. 07/2017 LE duplex: RSFA 75-19m, LSFA 75-74m, 50-74d.  . Pneumonia 07/2014   [facility] hospitalization  . Pre-diabetes   . PSA, INCREASED 07/09/2008  . Spinal stenosis of lumbar region     Past Surgical History:  Procedure Laterality Date  . ABDOMINAL AORTOGRAM W/LOWER EXTREMITY [name] 09/12/2018   Procedure: ABDOMINAL AORTOGRAM W/LOWER EXTREMITY;  Surgeon: [name], MD;  Location: [facility] CV LAB;  Service: Cardiovascular;  Laterality: [name];  . APPENDECTOMY     rupture  . CARDIAC CATHETERIZATION [name] 07/07/2015   Procedure: Left Heart Cath and Cors/Grafts Angiography;  Surgeon: [name], MD;  Location: [facility];  Service: Cardiovascular;  Laterality: [name];  . CATARACT EXTRACTION, BILATERAL    . CERVICAL SPINE SURGERY  12/2016   cervical stenosis [name])  . COLONOSCOPY  03/2010   HP polyp, diverticulosis, rpt 10 yrs ([name])  . CORONARY ARTERY BYPASS GRAFT  1990   3 vessel   . CYSTOSCOPY  12/23/10   [facility]  .  EYE SURGERY     cataract  . KNEE ARTHROSCOPY Right    x2  . LAMINOTOMY  1986   L5/S1 lumbar laminotomy for two ruptured discs/fusion  . LUMBAR LAMINECTOMY/DECOMPRESSION MICRODISCECTOMY [name] 07/13/2016   Procedure: LUMBAR TWO-THREE, LUMBAR THREE-FOUR, LUMBAR FOUR-FIVE LAMINECTOMY AND FORAMINOTOMY;  Surgeon: [name], MD;  Location: [facility];  Service: Neurosurgery;  Laterality: [name];  LAMINECTOMY AND FORAMINOTOMY L2-L3, L3-L4,L4-L5  . TOTAL HIP ARTHROPLASTY Bilateral 1999,2000  . TOTAL KNEE ARTHROPLASTY Right 03/06/2017   Procedure: RIGHT TOTAL KNEE ARTHROPLASTY;  Surgeon: Gaynelle Arabian, MD;  Location: [facility];  Service: Orthopedics;  Laterality: Right;    MEDICATIONS: . acetaminophen (TYLENOL) 650 MG CR tablet  .  albuterol (PROVENTIL HFA;VENTOLIN HFA) 108 (90 Base) MCG/ACT inhaler  . aspirin EC 81 MG tablet  . Cyanocobalamin (B-12) 1000 MCG SUBL  . ezetimibe (ZETIA) 10 MG tablet  . gabapentin (NEURONTIN) 300 MG capsule  . isosorbide mononitrate (IMDUR) 30 MG 24 hr tablet  . ketoconazole (NIZORAL) 2 % cream  . nitroGLYCERIN (NITROSTAT) 0.4 MG SL tablet  . polyethylene glycol (MIRALAX / GLYCOLAX) 17 g packet  . ranolazine (RANEXA) 500 MG 12 hr tablet  . simvastatin (ZOCOR) 40 MG tablet  . tolterodine (DETROL LA) 4 MG 24 hr capsule   No current facility-administered medications for this encounter.      Myra Gianotti, PA-C Surgical Short Stay/Anesthesiology [facility] Phone [phone] [facility] Phone [phone] 05/16/2019 4:56 PM

## 2019-05-16 NOTE — Anesthesia Preprocedure Evaluation (Deleted)
Anesthesia Evaluation    Airway        Dental   Pulmonary former smoker,           Cardiovascular hypertension,      Neuro/Psych    GI/Hepatic   Endo/Other    Renal/GU      Musculoskeletal   Abdominal   Peds  Hematology   Anesthesia Other Findings   Reproductive/Obstetrics                             Anesthesia Physical Anesthesia Plan  ASA:   Anesthesia Plan:    Post-op Pain Management:    Induction:   PONV Risk Score and Plan:   Airway Management Planned:   Additional Equipment:   Intra-op Plan:   Post-operative Plan:   Informed Consent:   Plan Discussed with:   Anesthesia Plan Comments: (PAT note written by Jessice Madill, PA-C. )        Anesthesia Quick Evaluation  

## 2019-05-20 ENCOUNTER — Other Ambulatory Visit: Payer: Self-pay

## 2019-05-20 ENCOUNTER — Encounter
Admission: RE | Admit: 2019-05-20 | Discharge: 2019-05-20 | Disposition: A | Discharge status: Home / Self Care | Payer: PPO | Source: Ambulatory Visit | Attending: Neurosurgery | Admitting: Neurosurgery

## 2019-05-20 DIAGNOSIS — U071 COVID-19: Secondary | ICD-10-CM | POA: Insufficient documentation

## 2019-05-20 DIAGNOSIS — Z01812 Encounter for preprocedural laboratory examination: Secondary | ICD-10-CM | POA: Diagnosis not present

## 2019-05-20 DIAGNOSIS — Z8616 Personal history of COVID-19: Secondary | ICD-10-CM

## 2019-05-20 HISTORY — DX: Personal history of COVID-19: Z86.16

## 2019-05-20 LAB — SARS CORONAVIRUS 2 (TAT 6-24 HRS): SARS Coronavirus 2: POSITIVE — AB

## 2019-05-21 ENCOUNTER — Encounter: Payer: Self-pay | Admitting: Family Medicine

## 2019-05-21 ENCOUNTER — Telehealth: Payer: Self-pay

## 2019-05-21 NOTE — Telephone Encounter (Signed)
Pt left v/m that he got notice he is positive for covid; pt tested and resulted on 05/20/19. Pt said one of his friends has covid and pt saw him on 05/15/19 and was not wearing mask and did not social distance. Pt has hx of pneumonia. Pt has congestion in chest,prod cough with yellow phlegm,SOB with exertion, hoarseness,no S/T and no wheezing. Pt said if he goes outside he breathes easier.  Pt has had CP and hurts worse with deep breath for last 3 days.no other covid symptoms and no travel. Pt has been quarantined since 05/17/19 when found out pt friend had tested positive for covid. pts wife is going to be tested now; and pt said he thinks he will wait to see if SOB or CP worsens and then he will go to [facility] ED; pt does not want to go to [facility]. FYI to Dr Darnell Level. And note given to [facility] CMA to add to the covid log for FU.

## 2019-05-22 ENCOUNTER — Encounter (HOSPITAL_COMMUNITY): Admission: RE | Payer: Self-pay | Source: Home / Self Care

## 2019-05-22 ENCOUNTER — Inpatient Hospital Stay (HOSPITAL_COMMUNITY): Admission: RE | Admit: 2019-05-22 | Payer: PPO | Source: Home / Self Care | Admitting: Neurosurgery

## 2019-05-22 SURGERY — POSTERIOR LUMBAR FUSION 1 LEVEL
Anesthesia: General

## 2019-05-22 MED ORDER — ALBUTEROL SULFATE HFA 108 (90 BASE) MCG/ACT IN AERS: 2.0000 | INHALATION_SPRAY | Freq: Four times a day (QID) | RESPIRATORY_TRACT | 3 refills | Status: DC | PRN

## 2019-05-22 NOTE — Telephone Encounter (Signed)
Albuterol inh refilled.  

## 2019-05-22 NOTE — Telephone Encounter (Addendum)
Spoke with pt asking how he is feeling.  Says he's about the same sxs, no better, no worse.  Says the fresh air helps so he steps outside daily to get a few breaths in.  However, pt is requesting a refill on ProAir inhaler.  [See Pt Msg, 05/21/19]  Do you want pt scheduled for a virtual visit?  If so, where?

## 2019-05-22 NOTE — Telephone Encounter (Signed)
plz call for update on symptoms today.  plz place on call list for positive Covid.

## 2019-05-22 NOTE — Addendum Note (Signed)
Addended by: Ria Bush on: 05/22/2019 10:43 AM   Modules accepted: Orders

## 2019-05-22 NOTE — Telephone Encounter (Signed)
Noted. Agree. Thanks.

## 2019-05-22 NOTE — Telephone Encounter (Signed)
Patient called and states he made an appointment to go to [facility] [place] to get Chest Xray and be seen today at 4:45 pm. He wanted to make sure that he should go. His Cp and SOB is worse, he is having burning in the chest and is proned to pneumonia per patient. Spoke with Dr Darnell Level and advised patient that yes he needs to go to be evaluated. Patient was advised to wear a mask and advised if symptom got more intense to call 911 or get to ER right away. Patient verbalized understanding.

## 2019-05-23 ENCOUNTER — Emergency Department
Admission: EM | Admit: 2019-05-23 | Discharge: 2019-05-24 | Disposition: A | Discharge status: Home / Self Care | Payer: PPO | Attending: Emergency Medicine | Admitting: Emergency Medicine

## 2019-05-23 DIAGNOSIS — I2510 Atherosclerotic heart disease of native coronary artery without angina pectoris: Secondary | ICD-10-CM | POA: Insufficient documentation

## 2019-05-23 DIAGNOSIS — U071 COVID-19: Secondary | ICD-10-CM | POA: Insufficient documentation

## 2019-05-23 DIAGNOSIS — Z7982 Long term (current) use of aspirin: Secondary | ICD-10-CM | POA: Insufficient documentation

## 2019-05-23 DIAGNOSIS — Z96651 Presence of right artificial knee joint: Secondary | ICD-10-CM | POA: Insufficient documentation

## 2019-05-23 DIAGNOSIS — Z79899 Other long term (current) drug therapy: Secondary | ICD-10-CM | POA: Diagnosis not present

## 2019-05-23 DIAGNOSIS — Z96643 Presence of artificial hip joint, bilateral: Secondary | ICD-10-CM | POA: Insufficient documentation

## 2019-05-23 DIAGNOSIS — J22 Unspecified acute lower respiratory infection: Secondary | ICD-10-CM | POA: Insufficient documentation

## 2019-05-23 DIAGNOSIS — Z87891 Personal history of nicotine dependence: Secondary | ICD-10-CM | POA: Diagnosis not present

## 2019-05-23 DIAGNOSIS — Z951 Presence of aortocoronary bypass graft: Secondary | ICD-10-CM | POA: Insufficient documentation

## 2019-05-23 DIAGNOSIS — I10 Essential (primary) hypertension: Secondary | ICD-10-CM | POA: Diagnosis not present

## 2019-05-23 DIAGNOSIS — R0602 Shortness of breath: Secondary | ICD-10-CM | POA: Diagnosis not present

## 2019-05-23 NOTE — ED Triage Notes (Signed)
Pt tested positive for Covid-19 9/28 for surgery 9/30. States now has developed a fever and some shob.

## 2019-05-24 ENCOUNTER — Emergency Department: Payer: PPO

## 2019-05-24 ENCOUNTER — Other Ambulatory Visit: Payer: Self-pay

## 2019-05-24 ENCOUNTER — Telehealth: Payer: Self-pay

## 2019-05-24 DIAGNOSIS — R0602 Shortness of breath: Secondary | ICD-10-CM | POA: Diagnosis not present

## 2019-05-24 MED ORDER — AZITHROMYCIN 500 MG PO TABS
500.0000 mg | ORAL_TABLET | ORAL | Status: AC
  Administered 2019-05-24: 500 mg via ORAL
  Filled 2019-05-24: qty 1

## 2019-05-24 MED ORDER — AZITHROMYCIN 250 MG PO TABS: ORAL_TABLET | ORAL | 0 refills | Status: DC

## 2019-05-24 MED ORDER — PREDNISONE 20 MG PO TABS
60.0000 mg | ORAL_TABLET | ORAL | Status: AC
  Administered 2019-05-24: 60 mg via ORAL
  Filled 2019-05-24: qty 3

## 2019-05-24 MED ORDER — HYDROCODONE-HOMATROPINE 5-1.5 MG/5ML PO SYRP: 5.0000 mL | ORAL_SOLUTION | Freq: Four times a day (QID) | ORAL | 0 refills | Status: DC | PRN

## 2019-05-24 MED ORDER — PREDNISONE 10 MG PO TABS: ORAL_TABLET | ORAL | 0 refills | Status: DC

## 2019-05-24 NOTE — Telephone Encounter (Signed)
I spoke with pt; today pt is feeling pretty good this morning; pt is on abx, prednisone and cough med. No tightness in chest and no SOB so far this morning. No fever today and pulse ox today is 93-94%.  ED precautions continues and pt will cb if needed.FYI to Dr Darnell Level.

## 2019-05-24 NOTE — Telephone Encounter (Addendum)
Plz call Monday for an update for covid positive patient.  Also to f/u ER visit from Wednesday - dx PNA by CXR placed on zpack and prednisone taper.  See other phone note.

## 2019-05-24 NOTE — Telephone Encounter (Signed)
[facility] Night - Client TELEPHONE ADVICE RECORD [facility] Patient Name: [name] Gender: Male DOB: 1941/06/12 Age: 78 Y 38 M 22 D Return Phone Number: [phone] (Primary) Address: City/State/Zip: [place] [address] Client [facility] Night - Client Client Site [place] Physician [name] - MD Contact Type Call Who Is Calling Patient / Member / Family / Caregiver Call Type Triage / Clinical Caller Name [name] Relationship To Patient Spouse Return Phone Number [phone] (Primary) Chief Complaint CHEST PAIN (>=21 years) - pain, pressure, heaviness or tightness Reason for Call Symptomatic / Request for [place] states her husband tested positive for covid19, he had the tightness in chest, cough, fever of 100.5, and body aches. His oxygen is at 91 right now and he has been known to get pneumonia. Translation No Nurse Assessment Nurse: [name] Buddy, RN, [name] Date/Time (Eastern Time): 05/23/2019 10:39:40 PM Confirm and document reason for call. If symptomatic, describe symptoms. ---Caller state pt tested positive for covid19, he has had the tightness in chest, cough, fever of 100.5, and body aches. States his oxygen is at 47 right now and he has been known to get pneumonia. Has the patient had close contact with a person known or suspected to have the novel coronavirus illness OR traveled / lives in area with major community spread (including international travel) in the last 14 days from the onset of symptoms? * If Asymptomatic, screen for exposure and travel within the last 14 days. ---Yes Does the patient have any new or worsening symptoms? ---Yes Will a triage be completed? ---Yes Related visit to physician within the last 2 weeks? ---No Does the PT have any chronic conditions? (i.e. diabetes, asthma, this includes High risk factors for  pregnancy, etc.) ---Yes List chronic conditions. ---Heart disease, Hx of PNA Is this a behavioral health or substance abuse call? ---No Guidelines Guideline Title Affirmed Question Affirmed Notes Nurse Date/Time (Eastern Time) Coronavirus (COVID-19) - Diagnosed or Suspected HIGH RISK patient (e.g., age > 41 years, [name], [place], [place] 05/23/2019 10:42:14 PM PLEASE NOTE: All timestamps contained within this report are represented as [place] Standard Time. CONFIDENTIALTY NOTICE: This fax transmission is intended only for the addressee. It contains information that is legally privileged, confidential or otherwise protected from use or disclosure. If you are not the intended recipient, you are strictly prohibited from reviewing, disclosing, copying using or disseminating any of this information or taking any action in reliance on or regarding this information. If you have received this fax in error, please notify us immediately by telephone so that we can arrange for its return to [place]. Phone: [phone], Toll-Free: [phone], Fax: [phone] Page: 2 of 3 Call Id: [id number] Guidelines Guideline Title Affirmed Question Affirmed Notes Nurse Date/Time Eilene Ghazi Time) diabetes, heart or lung disease, weak immune system) (Exception: Has already been evaluated by healthcare provider and has no new or worsening symptoms) Disp. Time Eilene Ghazi Time) Disposition Final User 05/23/2019 10:38:04 PM Send to Urgent Skip Estimable 05/23/2019 10:57:48 PM Called On-Call Provider [name] Buddy, RN, [name] 05/23/2019 11:00:03 PM Upgraded Outcome Per Physician [name] Buddy, RN, [name] Reason: Patient going to ED 05/23/2019 10:52:11 PM Call PCP Now Yes [name] Buddy, RN, [name] Caller Disagree/Comply Comply Caller Understands Yes PreDisposition Did not know what to do Care Advice Given Per Guideline CALL PCP NOW: * You need to discuss this with your doctor (or NP/PA). * I'll page the on-call provider now. If you haven't heard  from the provider (or me) within 30 minutes, call again. HOW TO PROTECT OTHERS - WHEN YOU ARE SICK WITH COVID-19: * STAY HOME: Stay home from school or work if you are sick. Do NOT go to religious services, child care centers, shopping, or other public places. Do NOT use public transportation (e.g., bus, taxis, ride-sharing). Do NOT allow any visitors to your home. Leave the house only if you need to seek urgent medical care. CALL BACK IF: * You become worse. CARE ADVICE given per CORONAVIRUS (COVID-19) - DIAGNOSED OR SUSPECTED (Adult) guideline. Comments User: Ernesta Amble, RN Date/Time Eilene Ghazi Time): 05/23/2019 10:44:54 PM Reports highest temp today 100.7 (oral) User: Ernesta Amble, RN Date/Time Eilene Ghazi Time): 05/23/2019 10:59:07 PM Called hospital ED to give report on pt and that he is positive for [facility] - ED Paging [facility] Phone DateTime Result/Outcome Message Type Notes Thersa Salt- MD [phone] 05/23/2019 10:57:48 PM Called On Call Provider - Reached Doctor Paged Thersa Salt- MD 05/23/2019 10:58:16 PM Spoke with On Call - General Message Result OCP agreed with decision for pt to to ED PLEASE NOTE:

## 2019-05-24 NOTE — Telephone Encounter (Signed)
Noted. Thanks.  plz call Monday for update on symptoms after receiving treatment at ER for Covid with possible PNA

## 2019-05-24 NOTE — Telephone Encounter (Signed)
Per chart review pt did go to [name] ED on 05/23/19.

## 2019-05-24 NOTE — ED Provider Notes (Signed)
[facility] Emergency Department Provider Note  ____________________________________________   First MD Initiated Contact with Patient 05/24/19 0012     (approximate)  I have reviewed the triage vital signs and the nursing notes.   HISTORY  Chief Complaint Shortness of Breath    HPI [name] is a 77 y.o. male with medical history as listed below who was diagnosed with COVID-19 when he was tested about 10 days ago prior to a back surgery.  He presents tonight with intermittent fevers as well as some shortness of breath with exertion.  He was asymptomatic when he was first diagnosed but has developed the symptoms over the last few days.  He has a history of recurrent pneumonia and uses an albuterol inhaler at home.  His family and primary care doctor convinced him to come in tonight because of shortness of breath seems to be getting worse.  He said that he has a normal appetite and has been eating and drinking just fine.  No chest pain.  No headache or neck pain.  No sore throat.  No nausea nor vomiting.  No dysuria.  He describes the symptoms overall as mild to moderate.  Nothing in particular seems to make him better and exertion makes him a little bit worse.        Past Medical History:  Diagnosis Date   BENIGN PROSTATIC HYPERTROPHY, WITH URINARY OBSTRUCTION 09/06/2007   CAD s/p CABG    a. 1990 s/p MI-->CABG x 2; b. 2002 s/p BMS to LCX; c. 06/2015 Cath: LM 50ost, LAD 100ost/p, 6m/d, D2 nl, LCX  patent stent, RCA 80p (small), LIMA->LAD nl, VG->D2 nl-->Med Rx.   Carotid arterial disease ([place])    a. 08/2016 Carotid U/S: <39% bilat.   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 did not tolerate CPAP.   Osteoarthritis    PAD (peripheral artery disease) ([place])    a. 07/2017 LE duplex: RSFA 75-40m, LSFA 75-52m, 50-74d.   Pneumonia 07/2014   [facility] hospitalization   Pre-diabetes    PSA, INCREASED 07/09/2008   Spinal stenosis of lumbar region     Patient Active Problem List   Diagnosis Date Noted   Hand swelling 12/07/2018   Chronic right shoulder pain 09/10/2018   CAD (coronary artery disease), native coronary artery 05/02/2018   Fall 03/08/2018   Cervical stenosis of spinal canal 06/05/2017   Pedal edema 06/05/2017   Health maintenance examination 12/06/2016   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/13/2016   Overweight (BMI 25.0-29.9) 07/04/2016   Low vitamin B12 level 01/01/2016   Medicare annual wellness visit, subsequent 07/03/2015   Advanced care planning/counseling discussion 07/03/2015   Prediabetes 05/04/2015   Angina pectoris ([place]) 05/04/2015   Ex-smoker 05/04/2015   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   Spermatocele 04/02/2013   Syncopal vertigo 11/04/2010   Lumbar disc disease with radiculopathy 11/04/2010   Inguinal hernia 05/26/2010   CAD, ARTERY BYPASS  GRAFT 08/11/2009   PVD (peripheral vascular disease) with claudication ([place]) 08/11/2009   DUPUYTREN'S CONTRACTURE, 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   Carotid stenosis 07/09/2008   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   Benign prostatic hyperplasia with urinary obstruction 09/06/2007   Essential hypertension 04/30/2007   GERD 04/30/2007   Hyperlipidemia 04/26/2007   OA (osteoarthritis) of knee 04/26/2007   DVT, HX OF 04/26/2007    Past Surgical History:  Procedure Laterality Date   ABDOMINAL AORTOGRAM W/LOWER EXTREMITY [name] 09/12/2018    Procedure: ABDOMINAL AORTOGRAM W/LOWER EXTREMITY;  Surgeon: [name], MD;  Location: [facility];  Service: Cardiovascular;  Laterality: [name];   APPENDECTOMY     rupture   CARDIAC CATHETERIZATION [name] 07/07/2015   Procedure: Left Heart Cath and Cors/Grafts Angiography;  Surgeon: [name], MD;  Location: [facility];  Service: Cardiovascular;  Laterality: [name];   CATARACT EXTRACTION, BILATERAL     CERVICAL SPINE SURGERY  12/2016   cervical stenosis [name])   COLONOSCOPY  03/2010   HP polyp, diverticulosis, rpt 10 yrs ([name])   CORONARY ARTERY BYPASS GRAFT  1990   3 vessel    CYSTOSCOPY  12/23/10   [place]   EYE SURGERY     cataract   KNEE ARTHROSCOPY Right    x2   LAMINOTOMY  1986   L5/S1 lumbar laminotomy for two ruptured discs/fusion   LUMBAR LAMINECTOMY/DECOMPRESSION MICRODISCECTOMY [name] 07/13/2016   Procedure: LUMBAR TWO-THREE, LUMBAR THREE-FOUR, LUMBAR FOUR-FIVE LAMINECTOMY AND FORAMINOTOMY;  Surgeon: [name], MD;  Location: [facility];  Service: Neurosurgery;  Laterality: [name];  LAMINECTOMY AND FORAMINOTOMY L2-L3, L3-L4,L4-L5   TOTAL HIP ARTHROPLASTY Bilateral 1999,2000   TOTAL KNEE ARTHROPLASTY Right 03/06/2017   Procedure: RIGHT TOTAL KNEE ARTHROPLASTY;  Surgeon: Gaynelle Arabian, MD;  Location: [facility];  Service: Orthopedics;  Laterality: Right;    Prior to Admission medications   Medication Sig Start Date End Date Taking? Authorizing Provider  acetaminophen (TYLENOL) 650 MG CR tablet Take 1,300 mg by mouth every 8 (eight) hours as needed for pain.    [provider]  albuterol (VENTOLIN HFA) 108 (90 Base) MCG/ACT inhaler Inhale 2 puffs into the lungs every 6 (six) hours as needed for wheezing or shortness of breath. 05/22/19   Ria Bush, MD  aspirin EC 81 MG tablet Take 1 tablet (81 mg total) by mouth daily. 05/01/17   [name], MD  azithromycin (ZITHROMAX) 250 MG tablet Take 2 tablets PO on day 1, then take 1  tablet PO daily for 4 more days 05/24/19   Hinda Kehr, MD  Cyanocobalamin (B-12) 1000 MCG SUBL Place 1 tablet under the tongue daily. Patient taking differently: Place 1 tablet under the tongue daily with lunch.  12/07/18   Ria Bush, MD  ezetimibe (ZETIA) 10 MG tablet TAKE ONE TABLET BY MOUTH EVERY DAY Patient taking differently: Take 10 mg by mouth at bedtime.  04/02/19   [name], MD  gabapentin (NEURONTIN) 300 MG capsule Take 300 mg by mouth 3 (three) times daily.    [provider]  HYDROcodone-homatropine (HYCODAN) 5-1.5 MG/5ML syrup Take 5 mLs by mouth every 6 (six) hours as needed for cough. 05/24/19   Hinda Kehr, MD  isosorbide mononitrate (IMDUR) 30 MG 24 hr tablet Take 1 tablet (30 mg total) by mouth 2 (two) times a day. 02/20/19 05/21/19  [name], MD  ketoconazole (NIZORAL) 2 % cream Apply 1 application topically daily as needed for irritation.     [provider]  nitroGLYCERIN (NITROSTAT) 0.4 MG SL tablet Place 1 tablet (0.4 mg total) under the tongue every 5 (five) minutes as needed for chest pain. 12/07/18   Ria Bush, MD  polyethylene glycol (MIRALAX / GLYCOLAX) 17 g packet Take 17 g by mouth daily.    [provider]  predniSONE (DELTASONE) 10 MG tablet Take 4 tabs (40 mg) PO x 3 days, then take 2 tabs (20 mg) PO x 3 days, then take 1 tab (10 mg) PO x 3 days, then take 1/2 tab (5 mg) PO x 4 days. 05/24/19   Hinda Kehr, MD  ranolazine (RANEXA) 500 MG 12 hr tablet Take 1 tablet (500 mg total) by mouth 2 (two) times daily for 14 days. 04/17/19 05/13/20  [name], MD  simvastatin (ZOCOR) 40 MG tablet TAKE ONE TABLET BY MOUTH EVERY DAY Patient taking differently: Take 40 mg by mouth at bedtime.  04/02/19   [name], MD  tolterodine (DETROL LA) 4 MG 24 hr capsule Take 4 mg by mouth daily.    [provider]    Allergies Morphine, Vioxx [rofecoxib], and Contrast media [iodinated diagnostic  agents]  Family History  Problem Relation Age of Onset   Stroke Mother    Heart attack Mother    Diabetes Mother    Stroke Father    Heart disease Father    Kidney disease Father        PCKD   Cancer Sister        throat   Diabetes Brother    Kidney disease Sister        PCKD   Cancer Other        5/7 nephews with lung cancer    Social History Social History   Tobacco Use   Smoking status: Former Smoker    Packs/day: 1.00    Years: 25.00    Pack years: 25.00    Types: Cigarettes    Quit date: 08/15/1988    Years since quitting: 30.7   Smokeless tobacco: Never Used  Substance Use Topics   Alcohol use: No    Alcohol/week: 0.0 standard drinks   Drug use: No    Review of Systems Constitutional: +fever/chills Eyes: No visual changes. ENT: No sore throat. Cardiovascular: Denies chest pain. Respiratory: Shortness of breath primarily with exertion. Gastrointestinal: No abdominal pain.  No nausea, no vomiting.  No diarrhea.  No constipation.  Good appetite.  Good oral intake. Genitourinary: Negative for dysuria. Musculoskeletal: Negative for neck pain.  Negative for back pain. Integumentary: Negative for rash. Neurological: Negative for headaches, focal weakness or numbness.   ____________________________________________   PHYSICAL EXAM:  VITAL SIGNS: ED Triage Vitals  Enc Vitals Group     BP 05/24/19 0000 (!) 151/61     Pulse Rate 05/24/19 0000 85     Resp 05/24/19 0000 20     Temp 05/24/19 0000 99.7 F (37.6 C)     Temp Source 05/24/19 0000 Oral     SpO2 05/24/19 0000 96 %     Weight 05/24/19 0001 95.3 kg (210 lb)     Height 05/24/19 0001 1.829 m (6')     Head Circumference --      Peak Flow --      Pain Score 05/24/19 0001 5     Pain Loc --      Pain Edu? --      Excl. in [place]? --     Constitutional: Alert and oriented.  Patient is well-appearing  and appears considerably younger than chronological age.  Asymptomatic currently. Eyes:  Conjunctivae are normal.  Head: Atraumatic. Nose: No congestion/rhinnorhea. Mouth/Throat: Mucous membranes are moist. Neck: No stridor.  No meningeal signs.   Cardiovascular: Normal rate, regular rhythm. Good peripheral circulation. Grossly normal heart sounds. Respiratory: Normal respiratory effort.  No retractions. Gastrointestinal: Soft and nontender. No distention.  Musculoskeletal: No lower extremity tenderness nor edema. No gross deformities of extremities. Neurologic:  Normal speech and language. No gross focal neurologic deficits are appreciated.  Skin:  Skin is warm, dry and intact. Psychiatric: Mood and affect are normal. Speech and behavior are normal.  ____________________________________________   LABS (all labs ordered are listed, but only abnormal results are displayed)  Labs Reviewed - No data to display ____________________________________________  EKG  No indication for EKG ____________________________________________  RADIOLOGY I, Hinda Kehr, personally viewed and evaluated these images (plain radiographs) as part of my medical decision making, as well as reviewing the written report by the radiologist.  ED MD interpretation: Streaky opacity at the right lung base, atelectasis or scarring versus early pneumonia.  Official radiology report(s): Dg Chest Portable 1 View  Result Date: 05/24/2019 CLINICAL DATA:  Covid positive shortness of breath EXAM: PORTABLE CHEST 1 VIEW COMPARISON:  June 05, 2017 FINDINGS: The heart size and mediastinal contours are within normal limits. Overlying median sternotomy wires are present. Mildly increased streaky opacity seen at the right lung base. The visualized skeletal structures are unremarkable. IMPRESSION: Mildly increased streaky opacity at the right lung base. This is non-specific could be due to atelectasis and/or early infectious etiology. Electronically Signed   By: Prudencio Pair M.D.   On: 05/24/2019 01:05     ____________________________________________   PROCEDURES   Procedure(s) performed (including Critical Care):  Procedures   ____________________________________________   INITIAL IMPRESSION / MDM / [place] / ED COURSE  As part of my medical decision making, I reviewed the following data within the [place] notes reviewed and incorporated, Old chart reviewed, Radiograph reviewed , Notes from prior ED visits and  Controlled Substance Database   Differential diagnosis includes, but is not limited to, COVID-19, community-acquired pneumonia, lower respiratory infection secondary to COVID-19, PE, ACS.  Electrolyte or metabolic abnormality is always possible, but the patient is quite well-appearing and claims that he has been eating and drinking normally.  He has no signs of volume depletion and his vital signs are all stable and within normal limits.  His oxygen level in triage after walking and was 96% and he is 99 to 100% while lying in bed.  He has no tachycardia.  I discussed with him the plan and we agreed that there was little benefit to doing lab work given that he just recently had labs and he generally feels fine except for the shortness of breath.  There is no reason to think he would be in renal failure or have some electrolyte derangement given that he has had no unusual inputs or outputs.  His chest x-ray shows a possible developing pneumonia and given his history I will treat him empirically with azithromycin and a course of prednisone (a taper).  I am giving him a first dose of 500 mg azithromycin by mouth and 60 mg of prednisone in the ED.  The prednisone is a 40 mg taper.  He understands and agrees with the plan and has an albuterol inhaler at home which she will continue to use.  I gave him my usual COVID-19 discussion  and return precautions.          ____________________________________________  FINAL CLINICAL IMPRESSION(S) / ED  DIAGNOSES  Final diagnoses:  Lower respiratory tract infection due to COVID-19 virus     MEDICATIONS GIVEN DURING THIS VISIT:  Medications  azithromycin (ZITHROMAX) tablet 500 mg (has no administration in time range)  predniSONE (DELTASONE) tablet 60 mg (has no administration in time range)     ED Discharge Orders         Ordered    azithromycin (ZITHROMAX) 250 MG tablet  Status:  Discontinued     05/24/19 0209    predniSONE (DELTASONE) 10 MG tablet  Status:  Discontinued     05/24/19 0209    HYDROcodone-homatropine (HYCODAN) 5-1.5 MG/5ML syrup  Every 6 hours PRN     05/24/19 0209    azithromycin (ZITHROMAX) 250 MG tablet     05/24/19 0212    predniSONE (DELTASONE) 10 MG tablet     05/24/19 [id number]          *Please note:  [name] was evaluated in Emergency Department on 05/24/2019 for the symptoms described in the history of present illness. He was evaluated in the context of the global COVID-19 pandemic, which necessitated consideration that the patient might be at risk for infection with the SARS-CoV-2 virus that causes COVID-19. Institutional protocols and algorithms that pertain to the evaluation of patients at risk for COVID-19 are in a state of rapid change based on information released by regulatory bodies including the CDC and federal and state organizations. These policies and algorithms were followed during the patient's care in the ED.  Some ED evaluations and interventions may be delayed as a result of limited staffing during the pandemic.*  Note:  This document was prepared using Dragon voice recognition software and may include unintentional dictation errors.   Hinda Kehr, MD 05/24/19 [phone]

## 2019-05-24 NOTE — Discharge Instructions (Signed)
As we discussed, your vital signs are reassuring tonight, and your symptoms are relatively mild.  There is a small area on your chest x-ray that could represent scarring from prior episodes of pneumonia, but given the concern that you may be developing a new pneumonia in the setting of COVID-19 infection, I have started you on antibiotics (azithromycin) as well as a course of steroids (prednisone).  I also wrote a prescription for cough medicine.  I encourage you to continue using your albuterol inhaler if you are feeling short of breath.  Try to stay hydrated by drinking plenty of fluids and try to stick to your normal dietary habits.  If your breathing gets significantly worse, if you have persistent vomiting and are unable to drink fluids, or if you develop any other symptoms that concern you, please return to the emergency department.

## 2019-05-27 NOTE — Telephone Encounter (Signed)
Patient returned Kinley Dozier's call.  Patient said he's feeling a little better, but patient has questions and would like to be called back at [phone]

## 2019-05-27 NOTE — Telephone Encounter (Signed)
Left message for patient to call back  

## 2019-05-27 NOTE — Telephone Encounter (Signed)
Patient returned [name]'s call.  Patient said he's feeling a little better, but patient has questions and would like to be called back at [phone]

## 2019-05-27 NOTE — Telephone Encounter (Signed)
Spoke with patient.  1)Patient states all symptom have improved/resolved except been tired-giving out fast. Low energy. Patient wonders how long it will take for him to be back to normal? He did go for a walk today and states he feels better when he walks outside then when he sits at home.  2)He originally went to get tested for COVID prior to back surgery he had scheduled on 05/22/2019 but that was cancelled and he is waiting to hear back from specialist office in regards to this and been re scheduled.   3)He has an appointment on 06/05/2019 for labs here and thinks he should post pone that. What is your recommendation?

## 2019-05-28 ENCOUNTER — Emergency Department: Payer: PPO

## 2019-05-28 ENCOUNTER — Emergency Department
Admission: EM | Admit: 2019-05-28 | Discharge: 2019-05-28 | Disposition: A | Discharge status: Home / Self Care | Payer: PPO | Source: Home / Self Care | Attending: Emergency Medicine | Admitting: Emergency Medicine

## 2019-05-28 ENCOUNTER — Other Ambulatory Visit: Payer: Self-pay

## 2019-05-28 ENCOUNTER — Encounter: Payer: Self-pay | Admitting: Emergency Medicine

## 2019-05-28 DIAGNOSIS — Z96651 Presence of right artificial knee joint: Secondary | ICD-10-CM | POA: Diagnosis present

## 2019-05-28 DIAGNOSIS — U071 COVID-19: Secondary | ICD-10-CM

## 2019-05-28 DIAGNOSIS — J1289 Other viral pneumonia: Secondary | ICD-10-CM | POA: Diagnosis present

## 2019-05-28 DIAGNOSIS — Z79899 Other long term (current) drug therapy: Secondary | ICD-10-CM | POA: Insufficient documentation

## 2019-05-28 DIAGNOSIS — M48061 Spinal stenosis, lumbar region without neurogenic claudication: Secondary | ICD-10-CM | POA: Diagnosis present

## 2019-05-28 DIAGNOSIS — Z96643 Presence of artificial hip joint, bilateral: Secondary | ICD-10-CM | POA: Diagnosis present

## 2019-05-28 DIAGNOSIS — Z7982 Long term (current) use of aspirin: Secondary | ICD-10-CM | POA: Diagnosis not present

## 2019-05-28 DIAGNOSIS — K449 Diaphragmatic hernia without obstruction or gangrene: Secondary | ICD-10-CM | POA: Diagnosis present

## 2019-05-28 DIAGNOSIS — R509 Fever, unspecified: Secondary | ICD-10-CM | POA: Diagnosis not present

## 2019-05-28 DIAGNOSIS — I739 Peripheral vascular disease, unspecified: Secondary | ICD-10-CM | POA: Diagnosis present

## 2019-05-28 DIAGNOSIS — N400 Enlarged prostate without lower urinary tract symptoms: Secondary | ICD-10-CM | POA: Diagnosis present

## 2019-05-28 DIAGNOSIS — I2510 Atherosclerotic heart disease of native coronary artery without angina pectoris: Secondary | ICD-10-CM | POA: Diagnosis present

## 2019-05-28 DIAGNOSIS — K219 Gastro-esophageal reflux disease without esophagitis: Secondary | ICD-10-CM | POA: Diagnosis present

## 2019-05-28 DIAGNOSIS — Z841 Family history of disorders of kidney and ureter: Secondary | ICD-10-CM | POA: Diagnosis not present

## 2019-05-28 DIAGNOSIS — J9601 Acute respiratory failure with hypoxia: Secondary | ICD-10-CM | POA: Diagnosis present

## 2019-05-28 DIAGNOSIS — I252 Old myocardial infarction: Secondary | ICD-10-CM | POA: Diagnosis not present

## 2019-05-28 DIAGNOSIS — G4733 Obstructive sleep apnea (adult) (pediatric): Secondary | ICD-10-CM | POA: Diagnosis present

## 2019-05-28 DIAGNOSIS — Z951 Presence of aortocoronary bypass graft: Secondary | ICD-10-CM | POA: Diagnosis not present

## 2019-05-28 DIAGNOSIS — Z8249 Family history of ischemic heart disease and other diseases of the circulatory system: Secondary | ICD-10-CM | POA: Diagnosis not present

## 2019-05-28 DIAGNOSIS — Z833 Family history of diabetes mellitus: Secondary | ICD-10-CM | POA: Diagnosis not present

## 2019-05-28 DIAGNOSIS — Z823 Family history of stroke: Secondary | ICD-10-CM | POA: Diagnosis not present

## 2019-05-28 DIAGNOSIS — R079 Chest pain, unspecified: Secondary | ICD-10-CM | POA: Diagnosis present

## 2019-05-28 DIAGNOSIS — M4802 Spinal stenosis, cervical region: Secondary | ICD-10-CM | POA: Diagnosis present

## 2019-05-28 DIAGNOSIS — R778 Other specified abnormalities of plasma proteins: Secondary | ICD-10-CM | POA: Diagnosis not present

## 2019-05-28 DIAGNOSIS — R5383 Other fatigue: Secondary | ICD-10-CM | POA: Diagnosis not present

## 2019-05-28 DIAGNOSIS — Z87891 Personal history of nicotine dependence: Secondary | ICD-10-CM | POA: Insufficient documentation

## 2019-05-28 DIAGNOSIS — R0602 Shortness of breath: Secondary | ICD-10-CM | POA: Diagnosis not present

## 2019-05-28 DIAGNOSIS — E785 Hyperlipidemia, unspecified: Secondary | ICD-10-CM | POA: Diagnosis present

## 2019-05-28 DIAGNOSIS — I10 Essential (primary) hypertension: Secondary | ICD-10-CM | POA: Diagnosis present

## 2019-05-28 DIAGNOSIS — R7303 Prediabetes: Secondary | ICD-10-CM | POA: Diagnosis present

## 2019-05-28 DIAGNOSIS — M1990 Unspecified osteoarthritis, unspecified site: Secondary | ICD-10-CM | POA: Diagnosis present

## 2019-05-28 LAB — CBC WITH DIFFERENTIAL/PLATELET
Abs Immature Granulocytes: 0.01 10*3/uL (ref 0.00–0.07)
Basophils Absolute: 0 10*3/uL (ref 0.0–0.1)
Basophils Relative: 0 %
Eosinophils Absolute: 0 10*3/uL (ref 0.0–0.5)
Eosinophils Relative: 0 %
HCT: 41.9 % (ref 39.0–52.0)
Hemoglobin: 14.4 g/dL (ref 13.0–17.0)
Immature Granulocytes: 0 %
Lymphocytes Relative: 16 %
Lymphs Abs: 0.7 10*3/uL (ref 0.7–4.0)
MCH: 31.6 pg (ref 26.0–34.0)
MCHC: 34.4 g/dL (ref 30.0–36.0)
MCV: 91.9 fL (ref 80.0–100.0)
Monocytes Absolute: 0.4 10*3/uL (ref 0.1–1.0)
Monocytes Relative: 8 %
Neutro Abs: 3.1 10*3/uL (ref 1.7–7.7)
Neutrophils Relative %: 76 %
Platelets: 135 10*3/uL — ABNORMAL LOW (ref 150–400)
RBC: 4.56 MIL/uL (ref 4.22–5.81)
RDW: 12.2 % (ref 11.5–15.5)
WBC: 4.2 10*3/uL (ref 4.0–10.5)
nRBC: 0 % (ref 0.0–0.2)

## 2019-05-28 LAB — BASIC METABOLIC PANEL
Anion gap: 11 (ref 5–15)
BUN: 25 mg/dL — ABNORMAL HIGH (ref 8–23)
CO2: 27 mmol/L (ref 22–32)
Calcium: 9 mg/dL (ref 8.9–10.3)
Chloride: 100 mmol/L (ref 98–111)
Creatinine, Ser: 1.31 mg/dL — ABNORMAL HIGH (ref 0.61–1.24)
GFR calc Af Amer: >60 mL/min (ref >60)
GFR calc non Af Amer: 52 mL/min — ABNORMAL LOW (ref >60)
Glucose, Bld: 96 mg/dL (ref 70–99)
Potassium: 3.8 mmol/L (ref 3.5–5.1)
Sodium: 138 mmol/L (ref 135–145)

## 2019-05-28 LAB — LACTIC ACID, PLASMA: Lactic Acid, Venous: 0.8 mmol/L (ref 0.5–1.9)

## 2019-05-28 MED ORDER — SODIUM CHLORIDE 0.9 % IV BOLUS
1000.0000 mL | Freq: Once | INTRAVENOUS | Status: AC
  Administered 2019-05-28: 1000 mL via INTRAVENOUS

## 2019-05-28 NOTE — Discharge Instructions (Addendum)
Continue to take the antibiotic as prescribed as well as the prednisone.  Continue to check your pulse oximetry throughout the day.  Continue to take Tylenol as needed for fever.  Return to the ER for new, worsening, or persistent shortness of breath, high fevers, weakness, vomiting, or persistent pulse oximetry readings at or below 90%.  Follow-up with your regular doctor.

## 2019-05-28 NOTE — ED Notes (Signed)
Patient moved to recliner due to discomfort in bed. While patient up moving, oxygen increased to 95-96%.

## 2019-05-28 NOTE — ED Notes (Signed)
Pt's wife notified of visitor restrictions per this RN.

## 2019-05-28 NOTE — ED Triage Notes (Signed)
Pt in via POV, reports positive for Covid on 10/5, complaints of ongoing fatigue and fever.  NAD noted at this time.

## 2019-05-28 NOTE — Telephone Encounter (Signed)
Date of dx 05/20/2019.  Seen at ER today with renewed fevers and hypoxia, discharged home plan for close monitoring. plz call today for update on how he's doing as well as place on call schedule for daily phone calls for the rest of the week given worsening symptoms.

## 2019-05-28 NOTE — ED Notes (Signed)
Patient to car in wheelchair. Patient verbalized understanding of discharge instructions and follow-up care.

## 2019-05-28 NOTE — ED Provider Notes (Signed)
[facility] Emergency Department Provider Note ____________________________________________   First MD Initiated Contact with Patient 05/28/19 [phone]     (approximate)  I have reviewed the triage vital signs and the nursing notes.   HISTORY  Chief Complaint Covid+    HPI [name] is a 78 y.o. male with PMH as noted below and status post recent diagnosis of COVID-19 who presents with recurrent fever, fatigue, and a low oxygen level last night.  The patient was diagnosed on 10/5.  He was seen in the ED on 10/9 and was discharged home at that time with prednisone and azithromycin.  He states that he was doing well the last few days, but then yesterday his oxygen level dropped to the high 80s and he has had recurrent fevers.  He continues to feel fatigued and weak.  He denies vomiting or diarrhea, chest pain, or significant shortness of breath at this time.  Past Medical History:  Diagnosis Date  . BENIGN PROSTATIC HYPERTROPHY, WITH URINARY OBSTRUCTION 09/06/2007  . CAD s/p CABG    a. 1990 s/p MI-->CABG x 2; b. 2002 s/p BMS to LCX; c. 06/2015 Cath: LM 50ost, LAD 100ost/p, 39m/d, D2 nl, LCX  patent stent, RCA 80p (small), LIMA->LAD nl, VG->D2 nl-->Med Rx.  . Carotid arterial disease ([place])    a. 08/2016 Carotid U/S: <39% bilat.  . 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 Community acquired pneumonia of right lower lobe of lung 06/05/2017  . 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 DVT, HX OF 1998  . GERD 04/30/2007  . Headache    hx migraines  . History of hiatal hernia   . History of shingles   . HYPERLIPIDEMIA 04/26/2007  . HYPERTENSION 04/30/2007  . INGUINAL HERNIA, RIGHT 05/26/2010  . Laceration of skin of left hand 03/08/2018  . Lumbar disc disease with radiculopathy   . Myocardial infarction ([place])    1989, 2002  . OSA (obstructive sleep apnea)    a. did not tolerate CPAP.  Marland Kitchen Osteoarthritis   . PAD (peripheral artery disease) ([place])    a. 07/2017 LE  duplex: RSFA 75-28m, LSFA 75-52m, 50-74d.  . Pneumonia 07/2014   [facility] hospitalization  . Pre-diabetes   . PSA, INCREASED 07/09/2008  . Spinal stenosis of lumbar region     Patient Active Problem List   Diagnosis Date Noted  . Hand swelling 12/07/2018  . Chronic right shoulder pain 09/10/2018  . CAD (coronary artery disease), native coronary artery 05/02/2018  . Fall 03/08/2018  . Cervical stenosis of spinal canal 06/05/2017  . Pedal edema 06/05/2017  . Health maintenance examination 12/06/2016  . 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 Overweight (BMI 25.0-29.9) 07/04/2016  . Low vitamin B12 level 01/01/2016  . Medicare annual wellness visit, subsequent 07/03/2015  . Advanced care planning/counseling discussion 07/03/2015  . Prediabetes 05/04/2015  . Angina pectoris ([place]) 05/04/2015  . Ex-smoker 05/04/2015  . 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 Spermatocele 04/02/2013  . Syncopal vertigo 11/04/2010  . Lumbar disc disease with radiculopathy 11/04/2010  . Inguinal hernia 05/26/2010  . 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 PVD (peripheral vascular disease) with claudication ([place]) 08/11/2009  . 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 Carotid stenosis 07/09/2008  . 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 Essential hypertension 04/30/2007  . GERD 04/30/2007  . Hyperlipidemia 04/26/2007  . OA (osteoarthritis) of knee 04/26/2007  . DVT, HX OF 04/26/2007    Past Surgical  History:  Procedure Laterality Date  . ABDOMINAL AORTOGRAM W/LOWER EXTREMITY [name] 09/12/2018   Procedure: ABDOMINAL AORTOGRAM W/LOWER EXTREMITY;  Surgeon: [name], MD;  Location: [place] [facility];  Service: Cardiovascular;  Laterality: [name];  . APPENDECTOMY     rupture  . CARDIAC CATHETERIZATION [name] 07/07/2015   Procedure: Left Heart Cath and Cors/Grafts Angiography;  Surgeon: [name], MD;   Location: [place] [facility];  Service: Cardiovascular;  Laterality: [name];  . CATARACT EXTRACTION, BILATERAL    . CERVICAL SPINE SURGERY  12/2016   cervical stenosis [name])  . COLONOSCOPY  03/2010   HP polyp, diverticulosis, rpt 10 yrs ([name])  . CORONARY ARTERY BYPASS GRAFT  1990   3 vessel   . CYSTOSCOPY  12/23/10   [facility]  . EYE SURGERY     cataract  . KNEE ARTHROSCOPY Right    x2  . LAMINOTOMY  1986   L5/S1 lumbar laminotomy for two ruptured discs/fusion  . LUMBAR LAMINECTOMY/DECOMPRESSION MICRODISCECTOMY [name] 07/13/2016   Procedure: LUMBAR TWO-THREE, LUMBAR THREE-FOUR, LUMBAR FOUR-FIVE LAMINECTOMY AND FORAMINOTOMY;  Surgeon: [name], MD;  Location: [facility];  Service: Neurosurgery;  Laterality: [name];  LAMINECTOMY AND FORAMINOTOMY L2-L3, L3-L4,L4-L5  . TOTAL HIP ARTHROPLASTY Bilateral 1999,2000  . TOTAL KNEE ARTHROPLASTY Right 03/06/2017   Procedure: RIGHT TOTAL KNEE ARTHROPLASTY;  Surgeon: Gaynelle Arabian, MD;  Location: [facility];  Service: Orthopedics;  Laterality: Right;    Prior to Admission medications   Medication Sig Start Date End Date Taking? Authorizing Provider  acetaminophen (TYLENOL) 650 MG CR tablet Take 1,300 mg by mouth every 8 (eight) hours as needed for pain.   Yes [provider]  albuterol (VENTOLIN HFA) 108 (90 Base) MCG/ACT inhaler Inhale 2 puffs into the lungs every 6 (six) hours as needed for wheezing or shortness of breath. 05/22/19  Yes Ria Bush, MD  aspirin EC 81 MG tablet Take 1 tablet (81 mg total) by mouth daily. 05/01/17  Yes [name], MD  azithromycin (ZITHROMAX) 250 MG tablet Take 2 tablets PO on day 1, then take 1 tablet PO daily for 4 more days 05/24/19  Yes Hinda Kehr, MD  Cyanocobalamin (B-12) 1000 MCG SUBL Place 1 tablet under the tongue daily. Patient taking differently: Place 1 tablet under the tongue daily with lunch.  12/07/18  Yes Ria Bush, MD  ezetimibe (ZETIA) 10 MG tablet TAKE ONE TABLET BY MOUTH EVERY  DAY Patient taking differently: Take 10 mg by mouth at bedtime.  04/02/19  Yes Gollan, Kathlene November, MD  gabapentin (NEURONTIN) 300 MG capsule Take 300 mg by mouth 2 (two) times daily.    Yes [provider]  HYDROcodone-homatropine (HYCODAN) 5-1.5 MG/5ML syrup Take 5 mLs by mouth every 6 (six) hours as needed for cough. 05/24/19  Yes Hinda Kehr, MD  isosorbide mononitrate (IMDUR) 30 MG 24 hr tablet Take 1 tablet (30 mg total) by mouth 2 (two) times a day. 02/20/19 05/28/19 Yes Gollan, Kathlene November, MD  ketoconazole (NIZORAL) 2 % cream Apply 1 application topically daily as needed for irritation.    Yes [provider]  nitroGLYCERIN (NITROSTAT) 0.4 MG SL tablet Place 1 tablet (0.4 mg total) under the tongue every 5 (five) minutes as needed for chest pain. 12/07/18  Yes Ria Bush, MD  polyethylene glycol (MIRALAX / GLYCOLAX) 17 g packet Take 17 g by mouth every other day.    Yes [provider]  predniSONE (DELTASONE) 10 MG tablet Take 4 tabs (40 mg) PO x  3 days, then take 2 tabs (20 mg) PO x 3 days, then take 1 tab (10 mg) PO x 3 days, then take 1/2 tab (5 mg) PO x 4 days. 05/24/19  Yes Hinda Kehr, MD  ranolazine (RANEXA) 500 MG 12 hr tablet Take 1 tablet (500 mg total) by mouth 2 (two) times daily for 14 days. 04/17/19 05/13/20 Yes Gollan, Kathlene November, MD  simvastatin (ZOCOR) 40 MG tablet TAKE ONE TABLET BY MOUTH EVERY DAY Patient taking differently: Take 40 mg by mouth at bedtime.  04/02/19  Yes Gollan, Kathlene November, MD  tolterodine (DETROL LA) 4 MG 24 hr capsule Take 4 mg by mouth daily.   Yes [provider]    Allergies Vioxx [rofecoxib], Contrast media [iodinated diagnostic agents], and Morphine  Family History  Problem Relation Age of Onset  . Stroke Mother   . Heart attack Mother   . Diabetes Mother   . Stroke Father   . Heart disease Father   . Kidney disease Father        PCKD  . Cancer Sister        throat  . Diabetes Brother   . Kidney disease  Sister        PCKD  . Cancer Other        5/7 nephews with lung cancer    Social History Social History   Tobacco Use  . Smoking status: Former Smoker    Packs/day: 1.00    Years: 25.00    Pack years: 25.00    Types: Cigarettes    Quit date: 08/15/1988    Years since quitting: 30.8  . Smokeless tobacco: Never Used  Substance Use Topics  . Alcohol use: No    Alcohol/week: 0.0 standard drinks  . Drug use: No    Review of Systems  Constitutional: Positive for fever. Eyes: No redness. ENT: No sore throat. Cardiovascular: Denies chest pain. Respiratory: Denies shortness of breath. Gastrointestinal: No vomiting or diarrhea.  Genitourinary: Negative for dysuria.  Musculoskeletal: Negative for back pain. Skin: Negative for rash. Neurological: Negative for headache.   ____________________________________________   PHYSICAL EXAM:  VITAL SIGNS: ED Triage Vitals [05/28/19 0854]  Enc Vitals Group     BP (!) 148/74     Pulse Rate (!) 113     Resp 19     Temp 99.7 F (37.6 C)     Temp Source Oral     SpO2 97 %     Weight 210 lb (95.3 kg)     Height 6' (1.829 m)     Head Circumference      Peak Flow      Pain Score      Pain Loc      Pain Edu?      Excl. in [place]?     Constitutional: Alert and oriented.  Relatively well appearing and in no acute distress. Eyes: Conjunctivae are normal.  Head: Atraumatic. Nose: No congestion/rhinnorhea. Mouth/Throat: Mucous membranes are moist.   Neck: Normal range of motion.  Cardiovascular: Tachycardic, regular rhythm. Good peripheral circulation. Respiratory: Normal respiratory effort.  No retractions. Gastrointestinal: No distention.  Musculoskeletal: No lower extremity edema.  Extremities warm and well perfused.  Neurologic:  Normal speech and language. No gross focal neurologic deficits are appreciated.  Skin:  Skin is warm and dry. No rash noted. Psychiatric: Mood and affect are normal. Speech and behavior are normal.   ____________________________________________   LABS (all labs ordered are listed, but only abnormal results  are displayed)  Labs Reviewed  BASIC METABOLIC PANEL - Abnormal; Notable for the following components:      Result Value   BUN 25 (*)    Creatinine, Ser 1.31 (*)    GFR calc non Af Amer 52 (*)    All other components within normal limits  CBC WITH DIFFERENTIAL/PLATELET - Abnormal; Notable for the following components:   Platelets 135 (*)    All other components within normal limits  LACTIC ACID, PLASMA  LACTIC ACID, PLASMA   ____________________________________________  EKG  ED ECG REPORT I, Arta Silence, the attending physician, personally viewed and interpreted this ECG.  Date: 05/28/2019 EKG Time: 0915 Rate: 93 Rhythm: normal sinus rhythm QRS Axis: normal Intervals: LAFB ST/T Wave abnormalities: normal Narrative Interpretation: no evidence of acute ischemia  ____________________________________________  RADIOLOGY  CXR: Slightly increased left lung base opacity, improvement on the right  ____________________________________________   PROCEDURES  Procedure(s) performed: No  Procedures  Critical Care performed: No ____________________________________________   INITIAL IMPRESSION / ASSESSMENT AND PLAN / ED COURSE  Pertinent labs & imaging results that were available during my care of the patient were reviewed by me and considered in my medical decision making (see chart for details).  78 year old male with PMH as noted above presents with persistent symptoms after a recent diagnosis of COVID-19.  Specifically he reports recurrent fevers and persistent fatigue.  He states that he had a low oxygen level last night to the high 80s, but denies significant shortness of breath at this time.  I reviewed the past medical records in epic and confirmed the diagnosis of COVID-19 on 05/20/2019.  The patient was seen in the ED on 10/9 and was discharged with  prednisone and azithromycin.  On exam today the patient is overall quite well-appearing.  He is slightly tachycardic with a borderline temperature but otherwise normal vital signs.  His O2 saturation is in the high 90s on room air and he has no increased work of breathing or respiratory distress.  Overall presentation is consistent with symptoms related to COVID-19.  We will obtain a repeat x-ray, lab work-up, give fluids, and reassess.  ----------------------------------------- 12:34 PM on 05/28/2019 -----------------------------------------  X-ray shows possibly slightly increased left lung base opacity although no marked change.  The patient's vital signs have normalized (a respiratory rate of 24 was documented, however on my reassessment the patient respiratory rate is around 18-20 and he has no increased work of breathing).  O2 saturation currently is around 95% on room air, and improves with further when the patient speaks.  The lab work-up is reassuring.  I discussed the plan of care with the patient.  Given that he has had some dips in his oxygen, and has had to come to the ED a few times, I think it would be totally reasonable to admit him for further observation.  However, at this time he is stable and discharge home would also be appropriate if the patient feels more comfortable with this.  I discussed the risks and benefits of each with the patient.  The patient has a strong preference to go home.  Especially since he has a pulse oximeter at home and feels comfortable using it, I think that this is a safe plan.  I counseled the patient extensively on the likely course of his illness and on return precautions; he expressed understanding.  He is stable for discharge at this time.  _____________________________  [name] was evaluated in Emergency Department on 05/28/2019  for the symptoms described in the history of present illness. He was evaluated in the context of the global  COVID-19 pandemic, which necessitated consideration that the patient might be at risk for infection with the SARS-CoV-2 virus that causes COVID-19. Institutional protocols and algorithms that pertain to the evaluation of patients at risk for COVID-19 are in a state of rapid change based on information released by regulatory bodies including the CDC and federal and state organizations. These policies and algorithms were followed during the patient's care in the ED.  ____________________________________________   FINAL CLINICAL IMPRESSION(S) / ED DIAGNOSES  Final diagnoses:  COVID-19      NEW MEDICATIONS STARTED DURING THIS VISIT:  New Prescriptions   No medications on file     Note:  This document was prepared using Dragon voice recognition software and may include unintentional dictation errors.    Arta Silence, MD 05/28/19 1236

## 2019-05-29 NOTE — Telephone Encounter (Signed)
Noted  

## 2019-05-29 NOTE — Telephone Encounter (Signed)
Spoke with pt for update on sxs.  States he's feeling ok.  Fever keeps coming and going.  Prescribed a new abx at ER yesterday.  Told his lungs are no better but no worse.  Last temp was 97 about 1 hour ago and O2 is 95%.

## 2019-05-29 NOTE — Telephone Encounter (Signed)
Ok. plz keep checking on him daily for the rest of the week.

## 2019-05-30 ENCOUNTER — Other Ambulatory Visit: Payer: Self-pay

## 2019-05-30 ENCOUNTER — Emergency Department: Payer: PPO

## 2019-05-30 ENCOUNTER — Inpatient Hospital Stay
Admission: EM | Admit: 2019-05-30 | Discharge: 2019-06-02 | DRG: 177 | Disposition: A | Discharge status: Other Acute Inpatient Hospital | Payer: PPO | Attending: Specialist | Admitting: Specialist

## 2019-05-30 ENCOUNTER — Inpatient Hospital Stay: Admission: AD | Admit: 2019-05-30 | Payer: PPO | Source: Other Acute Inpatient Hospital | Admitting: Internal Medicine

## 2019-05-30 DIAGNOSIS — K219 Gastro-esophageal reflux disease without esophagitis: Secondary | ICD-10-CM | POA: Diagnosis present

## 2019-05-30 DIAGNOSIS — J1289 Other viral pneumonia: Secondary | ICD-10-CM | POA: Diagnosis present

## 2019-05-30 DIAGNOSIS — Z823 Family history of stroke: Secondary | ICD-10-CM

## 2019-05-30 DIAGNOSIS — G4733 Obstructive sleep apnea (adult) (pediatric): Secondary | ICD-10-CM | POA: Diagnosis present

## 2019-05-30 DIAGNOSIS — Z951 Presence of aortocoronary bypass graft: Secondary | ICD-10-CM | POA: Diagnosis not present

## 2019-05-30 DIAGNOSIS — N400 Enlarged prostate without lower urinary tract symptoms: Secondary | ICD-10-CM | POA: Diagnosis present

## 2019-05-30 DIAGNOSIS — I10 Essential (primary) hypertension: Secondary | ICD-10-CM | POA: Diagnosis present

## 2019-05-30 DIAGNOSIS — J9601 Acute respiratory failure with hypoxia: Secondary | ICD-10-CM | POA: Diagnosis present

## 2019-05-30 DIAGNOSIS — G629 Polyneuropathy, unspecified: Secondary | ICD-10-CM | POA: Diagnosis present

## 2019-05-30 DIAGNOSIS — Z8249 Family history of ischemic heart disease and other diseases of the circulatory system: Secondary | ICD-10-CM | POA: Diagnosis not present

## 2019-05-30 DIAGNOSIS — I2510 Atherosclerotic heart disease of native coronary artery without angina pectoris: Secondary | ICD-10-CM | POA: Diagnosis present

## 2019-05-30 DIAGNOSIS — U071 COVID-19: Secondary | ICD-10-CM

## 2019-05-30 DIAGNOSIS — I252 Old myocardial infarction: Secondary | ICD-10-CM

## 2019-05-30 DIAGNOSIS — M1990 Unspecified osteoarthritis, unspecified site: Secondary | ICD-10-CM | POA: Diagnosis present

## 2019-05-30 DIAGNOSIS — Z841 Family history of disorders of kidney and ureter: Secondary | ICD-10-CM | POA: Diagnosis not present

## 2019-05-30 DIAGNOSIS — R7303 Prediabetes: Secondary | ICD-10-CM | POA: Diagnosis present

## 2019-05-30 DIAGNOSIS — M48061 Spinal stenosis, lumbar region without neurogenic claudication: Secondary | ICD-10-CM | POA: Diagnosis present

## 2019-05-30 DIAGNOSIS — Z87891 Personal history of nicotine dependence: Secondary | ICD-10-CM

## 2019-05-30 DIAGNOSIS — Z833 Family history of diabetes mellitus: Secondary | ICD-10-CM

## 2019-05-30 DIAGNOSIS — R079 Chest pain, unspecified: Secondary | ICD-10-CM | POA: Diagnosis present

## 2019-05-30 DIAGNOSIS — M4802 Spinal stenosis, cervical region: Secondary | ICD-10-CM | POA: Diagnosis present

## 2019-05-30 DIAGNOSIS — K449 Diaphragmatic hernia without obstruction or gangrene: Secondary | ICD-10-CM | POA: Diagnosis present

## 2019-05-30 DIAGNOSIS — E785 Hyperlipidemia, unspecified: Secondary | ICD-10-CM | POA: Diagnosis present

## 2019-05-30 DIAGNOSIS — Z96643 Presence of artificial hip joint, bilateral: Secondary | ICD-10-CM | POA: Diagnosis present

## 2019-05-30 DIAGNOSIS — Z7982 Long term (current) use of aspirin: Secondary | ICD-10-CM

## 2019-05-30 DIAGNOSIS — Z800 Family history of malignant neoplasm of digestive organs: Secondary | ICD-10-CM

## 2019-05-30 DIAGNOSIS — I739 Peripheral vascular disease, unspecified: Secondary | ICD-10-CM | POA: Diagnosis present

## 2019-05-30 DIAGNOSIS — Z96651 Presence of right artificial knee joint: Secondary | ICD-10-CM | POA: Diagnosis present

## 2019-05-30 HISTORY — DX: COVID-19: U07.1

## 2019-05-30 LAB — CBC WITH DIFFERENTIAL/PLATELET
Abs Immature Granulocytes: 0.02 10*3/uL (ref 0.00–0.07)
Basophils Absolute: 0 10*3/uL (ref 0.0–0.1)
Basophils Relative: 0 %
Eosinophils Absolute: 0 10*3/uL (ref 0.0–0.5)
Eosinophils Relative: 0 %
HCT: 41 % (ref 39.0–52.0)
Hemoglobin: 14 g/dL (ref 13.0–17.0)
Immature Granulocytes: 0 %
Lymphocytes Relative: 11 %
Lymphs Abs: 0.5 10*3/uL — ABNORMAL LOW (ref 0.7–4.0)
MCH: 31.3 pg (ref 26.0–34.0)
MCHC: 34.1 g/dL (ref 30.0–36.0)
MCV: 91.7 fL (ref 80.0–100.0)
Monocytes Absolute: 0.3 10*3/uL (ref 0.1–1.0)
Monocytes Relative: 6 %
Neutro Abs: 3.9 10*3/uL (ref 1.7–7.7)
Neutrophils Relative %: 83 %
Platelets: 144 10*3/uL — ABNORMAL LOW (ref 150–400)
RBC: 4.47 MIL/uL (ref 4.22–5.81)
RDW: 12.1 % (ref 11.5–15.5)
WBC: 4.8 10*3/uL (ref 4.0–10.5)
nRBC: 0 % (ref 0.0–0.2)

## 2019-05-30 LAB — C-REACTIVE PROTEIN: CRP: 13.2 mg/dL — ABNORMAL HIGH (ref <1.0)

## 2019-05-30 LAB — COMPREHENSIVE METABOLIC PANEL
ALT: 59 U/L — ABNORMAL HIGH (ref 0–44)
AST: 37 U/L (ref 15–41)
Albumin: 3.5 g/dL (ref 3.5–5.0)
Alkaline Phosphatase: 37 U/L — ABNORMAL LOW (ref 38–126)
Anion gap: 13 (ref 5–15)
BUN: 25 mg/dL — ABNORMAL HIGH (ref 8–23)
CO2: 23 mmol/L (ref 22–32)
Calcium: 8.7 mg/dL — ABNORMAL LOW (ref 8.9–10.3)
Chloride: 102 mmol/L (ref 98–111)
Creatinine, Ser: 1.15 mg/dL (ref 0.61–1.24)
GFR calc Af Amer: >60 mL/min (ref >60)
GFR calc non Af Amer: >60 mL/min (ref >60)
Glucose, Bld: 93 mg/dL (ref 70–99)
Potassium: 4 mmol/L (ref 3.5–5.1)
Sodium: 138 mmol/L (ref 135–145)
Total Bilirubin: 0.6 mg/dL (ref 0.3–1.2)
Total Protein: 6.4 g/dL — ABNORMAL LOW (ref 6.5–8.1)

## 2019-05-30 LAB — PROCALCITONIN: Procalcitonin: 0.17 ng/mL

## 2019-05-30 LAB — FIBRIN DERIVATIVES D-DIMER (ARMC ONLY): Fibrin derivatives D-dimer (ARMC): 1316.54 ng/mL (FEU) — ABNORMAL HIGH (ref 0.00–499.00)

## 2019-05-30 LAB — TROPONIN I (HIGH SENSITIVITY)
Troponin I (High Sensitivity): 19 ng/L — ABNORMAL HIGH (ref <18)
Troponin I (High Sensitivity): 27 ng/L — ABNORMAL HIGH (ref <18)

## 2019-05-30 LAB — LACTIC ACID, PLASMA
Lactic Acid, Venous: 1.3 mmol/L (ref 0.5–1.9)
Lactic Acid, Venous: 1.1 mmol/L (ref 0.5–1.9)

## 2019-05-30 LAB — FERRITIN: Ferritin: 771 ng/mL — ABNORMAL HIGH (ref 24–336)

## 2019-05-30 MED ORDER — ONDANSETRON HCL 4 MG/2ML IJ SOLN: 4.0000 mg | Freq: Four times a day (QID) | INTRAMUSCULAR | Status: DC | PRN

## 2019-05-30 MED ORDER — NITROGLYCERIN 0.4 MG SL SUBL: 0.4000 mg | SUBLINGUAL_TABLET | SUBLINGUAL | Status: DC | PRN

## 2019-05-30 MED ORDER — ACETAMINOPHEN 500 MG PO TABS
1000.0000 mg | ORAL_TABLET | Freq: Once | ORAL | Status: AC
  Administered 2019-05-30: 1000 mg via ORAL
  Filled 2019-05-30: qty 2

## 2019-05-30 MED ORDER — SODIUM CHLORIDE 0.9 % IV SOLN
100.0000 mg | INTRAVENOUS | Status: DC
  Administered 2019-05-31 – 2019-06-01 (×2): 100 mg via INTRAVENOUS
  Filled 2019-05-30 (×2): qty 20

## 2019-05-30 MED ORDER — EZETIMIBE 10 MG PO TABS
10.0000 mg | ORAL_TABLET | Freq: Every day | ORAL | Status: DC
  Administered 2019-05-30 – 2019-06-01 (×3): 10 mg via ORAL
  Filled 2019-05-30 (×3): qty 1

## 2019-05-30 MED ORDER — ALBUTEROL SULFATE (2.5 MG/3ML) 0.083% IN NEBU: 2.5000 mg | INHALATION_SOLUTION | Freq: Four times a day (QID) | RESPIRATORY_TRACT | Status: DC | PRN

## 2019-05-30 MED ORDER — VITAMIN B-12 1000 MCG PO TABS
1000.0000 ug | ORAL_TABLET | Freq: Every day | ORAL | Status: DC
  Administered 2019-05-31 – 2019-06-01 (×2): 1000 ug via ORAL
  Filled 2019-05-30 (×2): qty 1

## 2019-05-30 MED ORDER — ENOXAPARIN SODIUM 40 MG/0.4ML ~~LOC~~ SOLN
40.0000 mg | SUBCUTANEOUS | Status: DC
  Administered 2019-05-30 – 2019-06-01 (×3): 40 mg via SUBCUTANEOUS
  Filled 2019-05-30 (×3): qty 0.4

## 2019-05-30 MED ORDER — ONDANSETRON HCL 4 MG PO TABS: 4.0000 mg | ORAL_TABLET | Freq: Four times a day (QID) | ORAL | Status: DC | PRN

## 2019-05-30 MED ORDER — METHYLPREDNISOLONE SODIUM SUCC 125 MG IJ SOLR
60.0000 mg | Freq: Two times a day (BID) | INTRAMUSCULAR | Status: DC
  Administered 2019-05-30 – 2019-06-01 (×5): 60 mg via INTRAVENOUS
  Filled 2019-05-30 (×5): qty 2

## 2019-05-30 MED ORDER — DEXAMETHASONE SODIUM PHOSPHATE 10 MG/ML IJ SOLN
10.0000 mg | Freq: Once | INTRAMUSCULAR | Status: AC
  Administered 2019-05-30: 10 mg via INTRAVENOUS
  Filled 2019-05-30: qty 1

## 2019-05-30 MED ORDER — ISOSORBIDE MONONITRATE ER 30 MG PO TB24
30.0000 mg | ORAL_TABLET | Freq: Every day | ORAL | Status: DC
  Administered 2019-05-31 – 2019-06-01 (×2): 30 mg via ORAL
  Filled 2019-05-30 (×2): qty 1

## 2019-05-30 MED ORDER — POLYETHYLENE GLYCOL 3350 17 G PO PACK: 17.0000 g | PACK | Freq: Every day | ORAL | Status: DC | PRN

## 2019-05-30 MED ORDER — ACETAMINOPHEN 650 MG RE SUPP: 650.0000 mg | Freq: Four times a day (QID) | RECTAL | Status: DC | PRN

## 2019-05-30 MED ORDER — SODIUM CHLORIDE 0.9 % IV SOLN
200.0000 mg | Freq: Once | INTRAVENOUS | Status: AC
  Administered 2019-05-30: 200 mg via INTRAVENOUS
  Filled 2019-05-30: qty 40

## 2019-05-30 MED ORDER — ASPIRIN EC 81 MG PO TBEC
81.0000 mg | DELAYED_RELEASE_TABLET | Freq: Every day | ORAL | Status: DC
  Administered 2019-05-31 – 2019-06-01 (×2): 81 mg via ORAL
  Filled 2019-05-30 (×2): qty 1

## 2019-05-30 MED ORDER — GABAPENTIN 300 MG PO CAPS
300.0000 mg | ORAL_CAPSULE | Freq: Three times a day (TID) | ORAL | Status: DC
  Administered 2019-05-30: 300 mg via ORAL
  Filled 2019-05-30: qty 1

## 2019-05-30 MED ORDER — ACETAMINOPHEN 325 MG PO TABS
650.0000 mg | ORAL_TABLET | Freq: Four times a day (QID) | ORAL | Status: DC | PRN
  Administered 2019-05-31 – 2019-06-01 (×4): 650 mg via ORAL
  Filled 2019-05-30 (×4): qty 2

## 2019-05-30 MED ORDER — SIMVASTATIN 20 MG PO TABS
40.0000 mg | ORAL_TABLET | Freq: Every day | ORAL | Status: DC
  Administered 2019-05-30 – 2019-06-01 (×3): 40 mg via ORAL
  Filled 2019-05-30 (×3): qty 2

## 2019-05-30 MED ORDER — FESOTERODINE FUMARATE ER 8 MG PO TB24
8.0000 mg | ORAL_TABLET | Freq: Every day | ORAL | Status: DC
  Administered 2019-05-31 – 2019-06-01 (×2): 8 mg via ORAL
  Filled 2019-05-30 (×3): qty 1

## 2019-05-30 NOTE — H&P (Signed)
[facility] [place] NAME: [name]    MR#:  [id number]  DATE OF BIRTH:  [birth year]  DATE OF ADMISSION:  05/30/2019  PRIMARY CARE PHYSICIAN: Ria Bush, MD   REQUESTING/REFERRING PHYSICIAN: Blake Divine, MD  CHIEF COMPLAINT:   Chief Complaint  Patient presents with  . Shortness of Breath    HISTORY OF PRESENT ILLNESS:  [name]  is a 78 y.o. male with a known history of CAD s/p CABG, carotid arterial disease, hypertension, hyperlipidemia, prediabetes, OSA not on CPAP who presented to the ED with shortness of breath.  Patient states that he was diagnosed with Covid on October 5th.  He was undergoing preop testing in order to have back surgery when he had his positive Covid test.  He states he was not surprised, because he was exposed to a friend who also tested positive for Covid.  When he was first diagnosed, he was very achy and felt sick.  He then felt better for a short period of time, and then developed significant shortness of breath a couple of days ago.  He denies any cough.  He endorses some mild chest tightness.  This morning, he had a fever to 103F, so he came to the ED for further evaluation.  In the ED, he was tachypneic with respiratory rates in the low 20s.  He was requiring 2 L O2 by nasal cannula.  Labs were significant for troponin 19 > 27.  Procalcitonin 0.17.  Chest x-ray with increased left lung opacity, suggesting worsening atelectasis or pneumonia.  Hospitalists were called for admission.  PAST MEDICAL HISTORY:   Past Medical History:  Diagnosis Date  . BENIGN PROSTATIC HYPERTROPHY, WITH URINARY OBSTRUCTION 09/06/2007  . CAD s/p CABG    a. 1990 s/p MI-->CABG x 2; b. 2002 s/p BMS to LCX; c. 06/2015 Cath: LM 50ost, LAD 100ost/p, 6m/d, D2 nl, LCX  patent stent, RCA 80p (small), LIMA->LAD nl, VG->D2 nl-->Med Rx.  . Carotid arterial disease ([place])    a. 08/2016 Carotid U/S: <39% bilat.  . 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 Community acquired pneumonia of right lower lobe of lung 06/05/2017  . 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 DVT, HX OF 1998  . GERD 04/30/2007  . Headache    hx migraines  . History of hiatal hernia   . History of shingles   . HYPERLIPIDEMIA 04/26/2007  . HYPERTENSION 04/30/2007  . INGUINAL HERNIA, RIGHT 05/26/2010  . Laceration of skin of left hand 03/08/2018  . Lumbar disc disease with radiculopathy   . Myocardial infarction ([place])    1989, 2002  . OSA (obstructive sleep apnea)    a. did not tolerate CPAP.  Marland Kitchen Osteoarthritis   . PAD (peripheral artery disease) ([place])    a. 07/2017 LE duplex: RSFA 75-23m, LSFA 75-4m, 50-74d.  . Pneumonia 07/2014   [facility] hospitalization  . Pre-diabetes   . PSA, INCREASED 07/09/2008  . Spinal stenosis of lumbar region     PAST SURGICAL HISTORY:   Past Surgical History:  Procedure Laterality Date  . ABDOMINAL AORTOGRAM W/LOWER EXTREMITY [name] 09/12/2018   Procedure: ABDOMINAL AORTOGRAM W/LOWER EXTREMITY;  Surgeon: [name], MD;  Location: [name];  Service: Cardiovascular;  Laterality: [name];  . APPENDECTOMY     rupture  . CARDIAC CATHETERIZATION [name] 07/07/2015   Procedure: Left Heart Cath and Cors/Grafts Angiography;  Surgeon: [name], MD;  Location: [facility];  Service: Cardiovascular;  Laterality: [name];  . CATARACT EXTRACTION, BILATERAL    . CERVICAL SPINE SURGERY  12/2016   cervical stenosis [name])  . COLONOSCOPY  03/2010   HP polyp, diverticulosis, rpt 10 yrs ([name])  . CORONARY ARTERY BYPASS GRAFT  1990   3 vessel   . CYSTOSCOPY  12/23/10   [facility]  . EYE SURGERY     cataract  . KNEE ARTHROSCOPY Right    x2  . LAMINOTOMY  1986   L5/S1 lumbar laminotomy for two ruptured discs/fusion  . LUMBAR LAMINECTOMY/DECOMPRESSION MICRODISCECTOMY [name] 07/13/2016   Procedure: LUMBAR TWO-THREE, LUMBAR THREE-FOUR, LUMBAR FOUR-FIVE LAMINECTOMY AND FORAMINOTOMY;  Surgeon: [name],  MD;  Location: [facility];  Service: Neurosurgery;  Laterality: [name];  LAMINECTOMY AND FORAMINOTOMY L2-L3, L3-L4,L4-L5  . TOTAL HIP ARTHROPLASTY Bilateral 1999,2000  . TOTAL KNEE ARTHROPLASTY Right 03/06/2017   Procedure: RIGHT TOTAL KNEE ARTHROPLASTY;  Surgeon: Gaynelle Arabian, MD;  Location: [facility];  Service: Orthopedics;  Laterality: Right;    SOCIAL HISTORY:   Social History   Tobacco Use  . Smoking status: Former Smoker    Packs/day: 1.00    Years: 25.00    Pack years: 25.00    Types: Cigarettes    Quit date: 08/15/1988    Years since quitting: 30.8  . Smokeless tobacco: Never Used  Substance Use Topics  . Alcohol use: No    Alcohol/week: 0.0 standard drinks    FAMILY HISTORY:   Family History  Problem Relation Age of Onset  . Stroke Mother   . Heart attack Mother   . Diabetes Mother   . Stroke Father   . Heart disease Father   . Kidney disease Father        PCKD  . Cancer Sister        throat  . Diabetes Brother   . Kidney disease Sister        PCKD  . Cancer Other        5/7 nephews with lung cancer    DRUG ALLERGIES:   Allergies  Allergen Reactions  . Vioxx [Rofecoxib] Other (See Comments)    Hemorrhage   . Contrast Media [Iodinated Diagnostic Agents] Itching and Rash    Delayed reaction post abdominal aortagram.   . Morphine Nausea Only and Other (See Comments)    Irritability     REVIEW OF SYSTEMS:   Review of Systems  Constitutional: Positive for chills and fever.  HENT: Negative for congestion and sore throat.   Eyes: Negative for blurred vision.  Respiratory: Positive for shortness of breath. Negative for cough.   Cardiovascular: Positive for chest pain. Negative for palpitations.  Gastrointestinal: Negative for nausea and vomiting.  Genitourinary: Negative for dysuria and urgency.  Musculoskeletal: Positive for myalgias. Negative for back pain and neck pain.  Neurological: Negative for dizziness and headaches.  Psychiatric/Behavioral: Negative  for depression. The patient is not nervous/anxious.    MEDICATIONS AT HOME:   Prior to Admission medications   Medication Sig Start Date End Date Taking? Authorizing Provider  acetaminophen (TYLENOL) 650 MG CR tablet Take 1,300 mg by mouth every 8 (eight) hours as needed for pain.   Yes [provider]  albuterol (VENTOLIN HFA) 108 (90 Base) MCG/ACT inhaler Inhale 2 puffs into the lungs every 6 (six) hours as needed for wheezing or shortness of breath. 05/22/19  Yes Ria Bush, MD  aspirin EC 81 MG tablet Take 1 tablet (81 mg total) by mouth daily. 05/01/17  Yes [name], MD  azithromycin (  ZITHROMAX) 250 MG tablet Take 2 tablets PO on day 1, then take 1 tablet PO daily for 4 more days 05/24/19  Yes Hinda Kehr, MD  Cyanocobalamin (B-12) 1000 MCG SUBL Place 1 tablet under the tongue daily. Patient taking differently: Place 1 tablet under the tongue daily with lunch.  12/07/18  Yes Ria Bush, MD  ezetimibe (ZETIA) 10 MG tablet TAKE ONE TABLET BY MOUTH EVERY DAY Patient taking differently: Take 10 mg by mouth at bedtime.  04/02/19  Yes Gollan, Kathlene November, MD  gabapentin (NEURONTIN) 300 MG capsule Take 300 mg by mouth 3 (three) times daily.    Yes [provider]  HYDROcodone-homatropine (HYCODAN) 5-1.5 MG/5ML syrup Take 5 mLs by mouth every 6 (six) hours as needed for cough. 05/24/19  Yes Hinda Kehr, MD  isosorbide mononitrate (IMDUR) 30 MG 24 hr tablet Take 1 tablet (30 mg total) by mouth 2 (two) times a day. 02/20/19 05/30/19 Yes Gollan, Kathlene November, MD  ketoconazole (NIZORAL) 2 % cream Apply 1 application topically daily as needed for irritation.    Yes [provider]  nitroGLYCERIN (NITROSTAT) 0.4 MG SL tablet Place 1 tablet (0.4 mg total) under the tongue every 5 (five) minutes as needed for chest pain. 12/07/18  Yes Ria Bush, MD  polyethylene glycol (MIRALAX / GLYCOLAX) 17 g packet Take 17 g by mouth every other day.    Yes [provider]  predniSONE (DELTASONE) 10 MG tablet Take 4 tabs (40 mg) PO x 3 days, then take 2 tabs (20 mg) PO x 3 days, then take 1 tab (10 mg) PO x 3 days, then take 1/2 tab (5 mg) PO x 4 days. 05/24/19  Yes Hinda Kehr, MD  simvastatin (ZOCOR) 40 MG tablet TAKE ONE TABLET BY MOUTH EVERY DAY Patient taking differently: Take 40 mg by mouth at bedtime.  04/02/19  Yes Gollan, Kathlene November, MD  tolterodine (DETROL LA) 4 MG 24 hr capsule Take 4 mg by mouth daily.   Yes [provider]  ranolazine (RANEXA) 500 MG 12 hr tablet Take 1 tablet (500 mg total) by mouth 2 (two) times daily for 14 days. Patient not taking: Reported on 05/30/2019 04/17/19 05/13/20  [name], MD      VITAL SIGNS:  Blood pressure 136/68, pulse 68, temperature 98.7 F (37.1 C), temperature source Oral, resp. rate (!) 22, height 6' (1.829 m), weight 95.3 kg, SpO2 95 %.  PHYSICAL EXAMINATION:  Physical Exam  GENERAL:  78 y.o.-year-old patient sitting up in the chair with no acute distress.  EYES: Pupils equal, round, reactive to light and accommodation. No scleral icterus. Extraocular muscles intact.  HEENT: Head atraumatic, normocephalic. Oropharynx and nasopharynx clear.  NECK:  Supple, no jugular venous distention. No thyroid enlargement, no tenderness.  LUNGS: + Bibasilar crackles present.  Nasal cannula in place.  + Diminished breath sounds throughout all lung fields.  Able to speak in full sentences.  No use of accessory muscles of respiration.  CARDIOVASCULAR: RRR, S1, S2 normal. No murmurs, rubs, or gallops.  ABDOMEN: Soft, nontender, nondistended. Bowel sounds present. No organomegaly or mass.  EXTREMITIES: No pedal edema, cyanosis, or clubbing.  NEUROLOGIC: Cranial nerves II through XII are intact. Muscle strength 5/5 in all extremities. Sensation intact. Gait not checked.  PSYCHIATRIC: The patient is alert and oriented x 3.  SKIN: No obvious rash, lesion, or ulcer.   LABORATORY PANEL:   CBC  Recent Labs  Lab 05/30/19 0943  WBC 4.8  HGB  14.0  HCT 41.0  PLT 144*   ------------------------------------------------------------------------------------------------------------------  Chemistries  Recent Labs  Lab 05/30/19 0943  NA 138  K 4.0  CL 102  CO2 23  GLUCOSE 93  BUN 25*  CREATININE 1.15  CALCIUM 8.7*  AST 37  ALT 59*  ALKPHOS 37*  BILITOT 0.6   ------------------------------------------------------------------------------------------------------------------  Cardiac Enzymes No results for input(s): TROPONINI in the last 168 hours. ------------------------------------------------------------------------------------------------------------------  RADIOLOGY:  Dg Chest Portable 1 View  Result Date: 05/30/2019 CLINICAL DATA:  Shortness of breath, fever. EXAM: PORTABLE CHEST 1 VIEW COMPARISON:  May 28, 2019. FINDINGS: The heart size and mediastinal contours are within normal limits. No pneumothorax or pleural effusion is noted. Increased left midlung and basilar opacities are noted suggesting worsening atelectasis or pneumonia. Stable right lung atelectasis or inflammation. The visualized skeletal structures are unremarkable. IMPRESSION: Increased left lung opacity is noted suggesting worsening atelectasis or pneumonia. Stable right lung atelectasis or inflammation is noted. Electronically Signed   By: Marijo Conception M.D.   On: 05/30/2019 09:54      IMPRESSION AND PLAN:   Acute hypoxic respiratory failure secondary to COVID-19 infection.  Patient was diagnosed on 10/5.  Requiring 2 L O2 in the ED. -IV Solu-Medrol twice daily -Start remdesivir -Wean O2 as able -Patient looks pretty comfortable and is not having any chest pain currently.  If his chest pain returns, if he is not improving, or if he becomes tachycardic, would consider obtaining CTA chest. -Albuterol inhaler prn  Elevated troponin with a history of CAD s/p CABG-likely demand ischemia, due to  above. No active chest pain currently. -Trend troponins -Continue home aspirin, imdur  Hyperlipidemia- stable -Continue home simvastatin and Zetia  Peripheral vascular disease / hx carotid stenosis -Continue simvastatin, zetia, and aspirin  All the records are reviewed and case discussed with ED provider. Management plans discussed with the patient, family and they are in agreement.  CODE STATUS: Full  TOTAL TIME TAKING CARE OF THIS PATIENT: 45 minutes.    Berna Spare  M.D on 05/30/2019 at 6:16 PM  Between 7am to 6pm - Pager [phone]  After 6pm go to [email or web address] - Proofreader  [facility]  Office  [phone]  CC: Primary care physician; Ria Bush, MD   Note: This dictation was prepared with Dragon dictation along with smaller phrase technology. Any transcriptional errors that result from this process are unintentional.

## 2019-05-30 NOTE — ED Provider Notes (Signed)
Notified patient to be admitted at our hospital, no beds available at [facility].  Dr. Brett Albino aware, please see admission orders as [facility] now as bed available   Delman Kitten, MD 05/30/19 1928

## 2019-05-30 NOTE — ED Notes (Signed)
Pt's pillowcase changed at this time

## 2019-05-30 NOTE — Telephone Encounter (Signed)
Left message on vm asking pt to call back. Need to get an update on him today.

## 2019-05-30 NOTE — ED Notes (Signed)
Called floor to give report. RN is in isolation room. Will call back

## 2019-05-30 NOTE — ED Notes (Signed)
Pts gown and pillowcase changed at this time.

## 2019-05-30 NOTE — ED Notes (Signed)
ED TO INPATIENT HANDOFF REPORT  ED Nurse Name and Phone #:   S Name/Age/Gender [name] 78 y.o. male Room/Bed: ED26A/ED26A  Code Status   Code Status: Prior  Home/SNF/Other Home Patient oriented to: Ox4  Is this baseline? Yes   Triage Complete: Triage complete  Chief Complaint fever covid  Triage Note Pt brought into the ED by wife, states he is Covid+ and having increased SOB and fever for over the past week. States O2 at home was 84%   Allergies Allergies  Allergen Reactions  . Vioxx [Rofecoxib] Other (See Comments)    Hemorrhage   . Contrast Media [Iodinated Diagnostic Agents] Itching and Rash    Delayed reaction post abdominal aortagram.   . Morphine Nausea Only and Other (See Comments)    Irritability     Level of Care/Admitting Diagnosis ED Disposition    ED Disposition Condition [place]: [place] [[id number]]  Level of Care: Med-Surg [16]  Covid Evaluation: Confirmed COVID Positive  Diagnosis: COVID-19 [id number]  Admitting Physician: Hyman Bible DODD [id number]  Attending Physician: Hyman Bible DODD [id number]  Estimated length of stay: past midnight tomorrow  Certification:: I certify this patient will need inpatient services for at least 2 midnights  PT Class (Do Not Modify): Inpatient [101]  PT Acc Code (Do Not Modify): Private [1]       B Medical/Surgery History Past Medical History:  Diagnosis Date  . BENIGN PROSTATIC HYPERTROPHY, WITH URINARY OBSTRUCTION 09/06/2007  . CAD s/p CABG    a. 1990 s/p MI-->CABG x 2; b. 2002 s/p BMS to LCX; c. 06/2015 Cath: LM 50ost, LAD 100ost/p, 35m/d, D2 nl, LCX  patent stent, RCA 80p (small), LIMA->LAD nl, VG->D2 nl-->Med Rx.  . Carotid arterial disease ([place])    a. 08/2016 Carotid U/S: <39% bilat.  . 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 Community acquired pneumonia of right lower lobe of lung 06/05/2017  . 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 DVT, HX OF  1998  . GERD 04/30/2007  . Headache    hx migraines  . History of hiatal hernia   . History of shingles   . HYPERLIPIDEMIA 04/26/2007  . HYPERTENSION 04/30/2007  . INGUINAL HERNIA, RIGHT 05/26/2010  . Laceration of skin of left hand 03/08/2018  . Lumbar disc disease with radiculopathy   . Myocardial infarction ([place])    1989, 2002  . OSA (obstructive sleep apnea)    a. did not tolerate CPAP.  Marland Kitchen Osteoarthritis   . PAD (peripheral artery disease) ([place])    a. 07/2017 LE duplex: RSFA 75-52m, LSFA 75-2m, 50-74d.  . Pneumonia 07/2014   [facility] hospitalization  . Pre-diabetes   . PSA, INCREASED 07/09/2008  . Spinal stenosis of lumbar region    Past Surgical History:  Procedure Laterality Date  . ABDOMINAL AORTOGRAM W/LOWER EXTREMITY [name] 09/12/2018   Procedure: ABDOMINAL AORTOGRAM W/LOWER EXTREMITY;  Surgeon: [name], MD;  Location: [place] [facility];  Service: Cardiovascular;  Laterality: [name];  . APPENDECTOMY     rupture  . CARDIAC CATHETERIZATION [name] 07/07/2015   Procedure: Left Heart Cath and Cors/Grafts Angiography;  Surgeon: [name], MD;  Location: [place] [facility];  Service: Cardiovascular;  Laterality: [name];  . CATARACT EXTRACTION, BILATERAL    . CERVICAL SPINE SURGERY  12/2016   cervical stenosis [name])  . COLONOSCOPY  03/2010   HP polyp, diverticulosis, rpt 10 yrs ([name])  . [place]  3 vessel   . CYSTOSCOPY  12/23/10   Cope  . EYE SURGERY     cataract  . KNEE ARTHROSCOPY Right    x2  . LAMINOTOMY  1986   L5/S1 lumbar laminotomy for two ruptured discs/fusion  . LUMBAR LAMINECTOMY/DECOMPRESSION MICRODISCECTOMY [name] 07/13/2016   Procedure: LUMBAR TWO-THREE, LUMBAR THREE-FOUR, LUMBAR FOUR-FIVE LAMINECTOMY AND FORAMINOTOMY;  Surgeon: [name], MD;  Location: [facility];  Service: Neurosurgery;  Laterality: [name];  LAMINECTOMY AND FORAMINOTOMY L2-L3, L3-L4,L4-L5  . TOTAL HIP ARTHROPLASTY Bilateral 1999,2000  . TOTAL KNEE  ARTHROPLASTY Right 03/06/2017   Procedure: RIGHT TOTAL KNEE ARTHROPLASTY;  Surgeon: Gaynelle Arabian, MD;  Location: [facility];  Service: Orthopedics;  Laterality: Right;     A IV Location/Drains/Wounds Patient Lines/Drains/Airways Status   Active Line/Drains/Airways    Name:   Placement date:   Placement time:   Site:   Days:   Peripheral IV 05/30/19 Left Arm   05/30/19    0942    Arm   less than 1   Post Cath / Sheath 07/07/15 Right Arterial   07/07/15    1250    Arterial   1423   Incision (Closed) 07/13/16 Back Other (Comment)   07/13/16    1239     1051   Incision (Closed) 03/06/17 Knee Right   03/06/17    1419     815          Intake/Output Last 24 hours No intake or output data in the 24 hours ending 05/30/19 2219  Labs/Imaging Results for orders placed or performed during the hospital encounter of 05/30/19 (from the past 48 hour(s))  Lactic acid, plasma     Status: None   Collection Time: 05/30/19  9:43 AM  Result Value Ref Range   Lactic Acid, Venous 1.3 0.5 - 1.9 mmol/L    Comment: Performed at [facility], [address] [place], [place] [address]  Comprehensive metabolic panel     Status: Abnormal   Collection Time: 05/30/19  9:43 AM  Result Value Ref Range   Sodium 138 135 - 145 mmol/L   Potassium 4.0 3.5 - 5.1 mmol/L   Chloride 102 98 - 111 mmol/L   CO2 23 22 - 32 mmol/L   Glucose, Bld 93 70 - 99 mg/dL   BUN 25 (H) 8 - 23 mg/dL   Creatinine, Ser 1.15 0.61 - 1.24 mg/dL   Calcium 8.7 (L) 8.9 - 10.3 mg/dL   Total Protein 6.4 (L) 6.5 - 8.1 g/dL   Albumin 3.5 3.5 - 5.0 g/dL   AST 37 15 - 41 U/L   ALT 59 (H) 0 - 44 U/L   Alkaline Phosphatase 37 (L) 38 - 126 U/L   Total Bilirubin 0.6 0.3 - 1.2 mg/dL   GFR calc non Af Amer >60 >60 mL/min   GFR calc Af Amer >60 >60 mL/min   Anion gap 13 5 - 15    Comment: Performed at [facility], [address] [place], [place] [address]  CBC with Differential     Status: Abnormal   Collection Time: 05/30/19   9:43 AM  Result Value Ref Range   WBC 4.8 4.0 - 10.5 K/uL   RBC 4.47 4.22 - 5.81 MIL/uL   Hemoglobin 14.0 13.0 - 17.0 g/dL   HCT 41.0 39.0 - 52.0 %   MCV 91.7 80.0 - 100.0 fL   MCH 31.3 26.0 - 34.0 pg   MCHC 34.1 30.0 - 36.0 g/dL  RDW 12.1 11.5 - 15.5 %   Platelets 144 (L) 150 - 400 K/uL   nRBC 0.0 0.0 - 0.2 %   Neutrophils Relative % 83 %   Neutro Abs 3.9 1.7 - 7.7 K/uL   Lymphocytes Relative 11 %   Lymphs Abs 0.5 (L) 0.7 - 4.0 K/uL   Monocytes Relative 6 %   Monocytes Absolute 0.3 0.1 - 1.0 K/uL   Eosinophils Relative 0 %   Eosinophils Absolute 0.0 0.0 - 0.5 K/uL   Basophils Relative 0 %   Basophils Absolute 0.0 0.0 - 0.1 K/uL   Immature Granulocytes 0 %   Abs Immature Granulocytes 0.02 0.00 - 0.07 K/uL    Comment: Performed at [facility], [place], [place] [address]  Troponin I (High Sensitivity)     Status: Abnormal   Collection Time: 05/30/19  9:43 AM  Result Value Ref Range   Troponin I (High Sensitivity) 19 (H) <18 ng/L    Comment: (NOTE) Elevated high sensitivity troponin I (hsTnI) values and significant  changes across serial measurements may suggest ACS but many other  chronic and acute conditions are known to elevate hsTnI results.  Refer to the "Links" section for chest pain algorithms and additional  guidance. Performed at [facility], [address] [place], [place] [address]   Lactic acid, plasma     Status: None   Collection Time: 05/30/19 11:33 AM  Result Value Ref Range   Lactic Acid, Venous 1.1 0.5 - 1.9 mmol/L    Comment: Performed at [facility], [place], [place] [address]  Procalcitonin - Baseline     Status: None   Collection Time: 05/30/19 11:43 AM  Result Value Ref Range   Procalcitonin 0.17 ng/mL    Comment:        Interpretation: PCT (Procalcitonin) <= 0.5 ng/mL: Systemic infection (sepsis) is not likely. Local bacterial infection is possible. (NOTE)       Sepsis PCT  Algorithm           Lower Respiratory Tract                                      Infection PCT Algorithm    ----------------------------     ----------------------------         PCT < 0.25 ng/mL                PCT < 0.10 ng/mL         Strongly encourage             Strongly discourage   discontinuation of antibiotics    initiation of antibiotics    ----------------------------     -----------------------------       PCT 0.25 - 0.50 ng/mL            PCT 0.10 - 0.25 ng/mL               OR       >80% decrease in PCT            Discourage initiation of                                            antibiotics      Encourage discontinuation  of antibiotics    ----------------------------     -----------------------------         PCT >= 0.50 ng/mL              PCT 0.26 - 0.50 ng/mL               AND        <80% decrease in PCT             Encourage initiation of                                             antibiotics       Encourage continuation           of antibiotics    ----------------------------     -----------------------------        PCT >= 0.50 ng/mL                  PCT > 0.50 ng/mL               AND         increase in PCT                  Strongly encourage                                      initiation of antibiotics    Strongly encourage escalation           of antibiotics                                     -----------------------------                                           PCT <= 0.25 ng/mL                                                 OR                                        > 80% decrease in PCT                                     Discontinue / Do not initiate                                             antibiotics Performed at [facility], [address] [place], [place] [address]   Troponin I (High Sensitivity)     Status: Abnormal   Collection Time: 05/30/19 11:43 AM  Result Value Ref Range   Troponin I (High Sensitivity) 27 (H) <18  ng/L  Comment: (NOTE) Elevated high sensitivity troponin I (hsTnI) values and significant  changes across serial measurements may suggest ACS but many other  chronic and acute conditions are known to elevate hsTnI results.  Refer to the "Links" section for chest pain algorithms and additional  guidance. Performed at [facility], [address] [place], [place] [address]   Fibrin derivatives D-Dimer     Status: Abnormal   Collection Time: 05/30/19 11:43 AM  Result Value Ref Range   Fibrin derivatives D-dimer ([facility]) 1,316.54 (H) 0.00 - 499.00 ng/mL (FEU)    Comment: (NOTE) <> Exclusion of Venous Thromboembolism (VTE) - OUTPATIENT ONLY   (Emergency Department or [place])   0-499 ng/ml (FEU): With a low to intermediate pretest probability                      for VTE this test result excludes the diagnosis                      of VTE.   >499 ng/ml (FEU) : VTE not excluded; additional work up for VTE is                      required. <> Testing on Inpatients and Evaluation of Disseminated Intravascular   Coagulation (DIC) Reference Range:   0-499 ng/ml (FEU) Performed at [name], [address] [place], [place] [address]   C-reactive protein     Status: Abnormal   Collection Time: 05/30/19 11:43 AM  Result Value Ref Range   CRP 13.2 (H) <1.0 mg/dL    Comment: Performed at [facility] [address], [place], [place] [address]  Ferritin (Iron Binding Protein)     Status: Abnormal   Collection Time: 05/30/19 11:43 AM  Result Value Ref Range   Ferritin 771 (H) 24 - 336 ng/mL    Comment: Performed at [facility], [address], [place], [place] [address]   Dg Chest Portable 1 View  Result Date: 05/30/2019 CLINICAL DATA:  Shortness of breath, fever. EXAM: PORTABLE CHEST 1 VIEW COMPARISON:  May 28, 2019. FINDINGS: The heart size and mediastinal contours are within normal limits. No pneumothorax or pleural effusion is noted. Increased  left midlung and basilar opacities are noted suggesting worsening atelectasis or pneumonia. Stable right lung atelectasis or inflammation. The visualized skeletal structures are unremarkable. IMPRESSION: Increased left lung opacity is noted suggesting worsening atelectasis or pneumonia. Stable right lung atelectasis or inflammation is noted. Electronically Signed   By: Marijo Conception M.D.   On: 05/30/2019 09:54    Pending Labs Unresulted Labs (From admission, onward)    Start     Ordered   05/30/19 0933  Culture, blood (routine x 2)  BLOOD CULTURE X 2,   STAT     05/30/19 0934   Signed and Held  Basic metabolic panel  Tomorrow morning,   R     Signed and Held   Signed and Held  CBC  Tomorrow morning,   R     Signed and Held          Vitals/Pain Today's Vitals   05/30/19 1630 05/30/19 1800 05/30/19 1931 05/30/19 2047  BP: 136/68 (!) 139/59    Pulse: 68     Resp: (!) 22 16    Temp:      TempSrc:      SpO2: 95%     Weight:  Height:      PainSc:   0-No pain 0-No pain    Isolation Precautions No active isolations  Medications Medications  albuterol (VENTOLIN HFA) 108 (90 Base) MCG/ACT inhaler 2 puff (has no administration in time range)  gabapentin (NEURONTIN) capsule 300 mg (300 mg Oral Given 05/30/19 1937)  simvastatin (ZOCOR) tablet 40 mg (has no administration in time range)  remdesivir 200 mg in sodium chloride 0.9 % 250 mL IVPB (has no administration in time range)    Followed by  remdesivir 100 mg in sodium chloride 0.9 % 250 mL IVPB (has no administration in time range)  dexamethasone (DECADRON) injection 10 mg (10 mg Intravenous Given 05/30/19 0951)  acetaminophen (TYLENOL) tablet 1,000 mg (1,000 mg Oral Given 05/30/19 0950)    Mobility walks Low fall risk   Focused Assessments Pulmonary Assessment Handoff:  Lung sounds:   O2 Device: Nasal Cannula O2 Flow Rate (L/min): 2 L/min      R Recommendations: See Admitting Provider Note  Report given  to:   Additional Notes:

## 2019-05-30 NOTE — ED Triage Notes (Addendum)
Pt brought into the ED by wife, states he is Covid+ and having increased SOB and fever for over the past week. States O2 at home was 84%

## 2019-05-30 NOTE — ED Notes (Signed)
[facility] called to take [facility] spoke to [place]

## 2019-05-30 NOTE — ED Notes (Signed)
Updated pt on care.  

## 2019-05-30 NOTE — ED Notes (Signed)
Pt given crackers and drink at this time 

## 2019-05-30 NOTE — ED Provider Notes (Signed)
[facility] Emergency Department Provider Note   ____________________________________________   First MD Initiated Contact with Patient 05/30/19 [phone]     (approximate)  I have reviewed the triage vital signs and the nursing notes.   HISTORY  Chief Complaint Shortness of Breath    HPI [name] is a 78 y.o. male with possible history of CAD status post CABG, hypertension, hyperlipidemia who presents to the ED complaining of chest pain or shortness of breath.  Patient reports he was initially diagnosed with COVID 19 on October 5 and has been feeling increasingly poorly since then.  He states he continues to have fevers, as high as 103.0 last night.  He complains of significant burning pain in the center of his chest that is worse with a deep breath, on top of some shortness of breath.  He was seen in the ED for worsening symptoms 3 days ago and offered admission to [facility], but declined.  He now states "I should have stayed".  He has been taking steroids and azithromycin at home.        Past Medical History:  Diagnosis Date  . BENIGN PROSTATIC HYPERTROPHY, WITH URINARY OBSTRUCTION 09/06/2007  . CAD s/p CABG    a. 1990 s/p MI-->CABG x 2; b. 2002 s/p BMS to LCX; c. 06/2015 Cath: LM 50ost, LAD 100ost/p, 32m/d, D2 nl, LCX  patent stent, RCA 80p (small), LIMA->LAD nl, VG->D2 nl-->Med Rx.  . Carotid arterial disease ([place])    a. 08/2016 Carotid U/S: <39% bilat.  . 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 Community acquired pneumonia of right lower lobe of lung 06/05/2017  . 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 DVT, HX OF 1998  . GERD 04/30/2007  . Headache    hx migraines  . History of hiatal hernia   . History of shingles   . HYPERLIPIDEMIA 04/26/2007  . HYPERTENSION 04/30/2007  . INGUINAL HERNIA, RIGHT 05/26/2010  . Laceration of skin of left hand 03/08/2018  . Lumbar disc disease with radiculopathy   . Myocardial infarction ([place])    1989, 2002   . OSA (obstructive sleep apnea)    a. did not tolerate CPAP.  Marland Kitchen Osteoarthritis   . PAD (peripheral artery disease) ([place])    a. 07/2017 LE duplex: RSFA 75-43m, LSFA 75-76m, 50-74d.  . Pneumonia 07/2014   [facility] hospitalization  . Pre-diabetes   . PSA, INCREASED 07/09/2008  . Spinal stenosis of lumbar region     Patient Active Problem List   Diagnosis Date Noted  . Hand swelling 12/07/2018  . Chronic right shoulder pain 09/10/2018  . CAD (coronary artery disease), native coronary artery 05/02/2018  . Fall 03/08/2018  . Cervical stenosis of spinal canal 06/05/2017  . Pedal edema 06/05/2017  . Health maintenance examination 12/06/2016  . 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 Overweight (BMI 25.0-29.9) 07/04/2016  . Low vitamin B12 level 01/01/2016  . Medicare annual wellness visit, subsequent 07/03/2015  . Advanced care planning/counseling discussion 07/03/2015  . Prediabetes 05/04/2015  . Angina pectoris ([place]) 05/04/2015  . Ex-smoker 05/04/2015  . 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 Spermatocele 04/02/2013  . Syncopal vertigo 11/04/2010  . Lumbar disc disease with radiculopathy 11/04/2010  . Inguinal hernia 05/26/2010  . 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 PVD (peripheral vascular disease) with claudication ([place]) 08/11/2009  . 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 Carotid stenosis 07/09/2008  . 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  Essential hypertension 04/30/2007  . GERD 04/30/2007  . Hyperlipidemia 04/26/2007  . OA (osteoarthritis) of knee 04/26/2007  . DVT, HX OF 04/26/2007    Past Surgical History:  Procedure Laterality Date  . ABDOMINAL AORTOGRAM W/LOWER EXTREMITY [name] 09/12/2018   Procedure: ABDOMINAL AORTOGRAM W/LOWER EXTREMITY;  Surgeon: [name], MD;  Location: [place] [facility];  Service: Cardiovascular;  Laterality: [name];  . APPENDECTOMY     rupture   . CARDIAC CATHETERIZATION [name] 07/07/2015   Procedure: Left Heart Cath and Cors/Grafts Angiography;  Surgeon: [name], MD;  Location: [place] [facility];  Service: Cardiovascular;  Laterality: [name];  . CATARACT EXTRACTION, BILATERAL    . CERVICAL SPINE SURGERY  12/2016   cervical stenosis [name])  . COLONOSCOPY  03/2010   HP polyp, diverticulosis, rpt 10 yrs ([name])  . CORONARY ARTERY BYPASS GRAFT  1990   3 vessel   . CYSTOSCOPY  12/23/10   [facility]  . EYE SURGERY     cataract  . KNEE ARTHROSCOPY Right    x2  . LAMINOTOMY  1986   L5/S1 lumbar laminotomy for two ruptured discs/fusion  . LUMBAR LAMINECTOMY/DECOMPRESSION MICRODISCECTOMY [name] 07/13/2016   Procedure: LUMBAR TWO-THREE, LUMBAR THREE-FOUR, LUMBAR FOUR-FIVE LAMINECTOMY AND FORAMINOTOMY;  Surgeon: [name], MD;  Location: [place];  Service: Neurosurgery;  Laterality: [name];  LAMINECTOMY AND FORAMINOTOMY L2-L3, L3-L4,L4-L5  . TOTAL HIP ARTHROPLASTY Bilateral 1999,2000  . TOTAL KNEE ARTHROPLASTY Right 03/06/2017   Procedure: RIGHT TOTAL KNEE ARTHROPLASTY;  Surgeon: Gaynelle Arabian, MD;  Location: [facility];  Service: Orthopedics;  Laterality: Right;    Prior to Admission medications   Medication Sig Start Date End Date Taking? Authorizing Provider  acetaminophen (TYLENOL) 650 MG CR tablet Take 1,300 mg by mouth every 8 (eight) hours as needed for pain.   Yes [provider]  albuterol (VENTOLIN HFA) 108 (90 Base) MCG/ACT inhaler Inhale 2 puffs into the lungs every 6 (six) hours as needed for wheezing or shortness of breath. 05/22/19  Yes Ria Bush, MD  aspirin EC 81 MG tablet Take 1 tablet (81 mg total) by mouth daily. 05/01/17  Yes [name], MD  azithromycin (ZITHROMAX) 250 MG tablet Take 2 tablets PO on day 1, then take 1 tablet PO daily for 4 more days 05/24/19  Yes Hinda Kehr, MD  Cyanocobalamin (B-12) 1000 MCG SUBL Place 1 tablet under the tongue daily. Patient taking differently: Place 1 tablet  under the tongue daily with lunch.  12/07/18  Yes Ria Bush, MD  ezetimibe (ZETIA) 10 MG tablet TAKE ONE TABLET BY MOUTH EVERY DAY Patient taking differently: Take 10 mg by mouth at bedtime.  04/02/19  Yes Gollan, Kathlene November, MD  gabapentin (NEURONTIN) 300 MG capsule Take 300 mg by mouth 3 (three) times daily.    Yes [provider]  HYDROcodone-homatropine (HYCODAN) 5-1.5 MG/5ML syrup Take 5 mLs by mouth every 6 (six) hours as needed for cough. 05/24/19  Yes Hinda Kehr, MD  isosorbide mononitrate (IMDUR) 30 MG 24 hr tablet Take 1 tablet (30 mg total) by mouth 2 (two) times a day. 02/20/19 05/30/19 Yes Gollan, Kathlene November, MD  ketoconazole (NIZORAL) 2 % cream Apply 1 application topically daily as needed for irritation.    Yes [provider]  nitroGLYCERIN (NITROSTAT) 0.4 MG SL tablet Place 1 tablet (0.4 mg total) under the tongue every 5 (five) minutes as needed for chest pain. 12/07/18  Yes Ria Bush, MD  polyethylene glycol (MIRALAX / GLYCOLAX) 17 g  packet Take 17 g by mouth every other day.    Yes [provider]  predniSONE (DELTASONE) 10 MG tablet Take 4 tabs (40 mg) PO x 3 days, then take 2 tabs (20 mg) PO x 3 days, then take 1 tab (10 mg) PO x 3 days, then take 1/2 tab (5 mg) PO x 4 days. 05/24/19  Yes Hinda Kehr, MD  simvastatin (ZOCOR) 40 MG tablet TAKE ONE TABLET BY MOUTH EVERY DAY Patient taking differently: Take 40 mg by mouth at bedtime.  04/02/19  Yes Gollan, Kathlene November, MD  tolterodine (DETROL LA) 4 MG 24 hr capsule Take 4 mg by mouth daily.   Yes [provider]  ranolazine (RANEXA) 500 MG 12 hr tablet Take 1 tablet (500 mg total) by mouth 2 (two) times daily for 14 days. Patient not taking: Reported on 05/30/2019 04/17/19 05/13/20  [name], MD    Allergies Vioxx [rofecoxib], Contrast media [iodinated diagnostic agents], and Morphine  Family History  Problem Relation Age of Onset  . Stroke Mother   . Heart attack Mother    . Diabetes Mother   . Stroke Father   . Heart disease Father   . Kidney disease Father        PCKD  . Cancer Sister        throat  . Diabetes Brother   . Kidney disease Sister        PCKD  . Cancer Other        5/7 nephews with lung cancer    Social History Social History   Tobacco Use  . Smoking status: Former Smoker    Packs/day: 1.00    Years: 25.00    Pack years: 25.00    Types: Cigarettes    Quit date: 08/15/1988    Years since quitting: 30.8  . Smokeless tobacco: Never Used  Substance Use Topics  . Alcohol use: No    Alcohol/week: 0.0 standard drinks  . Drug use: No    Review of Systems  Constitutional: Positive for fever/chills Eyes: No visual changes. ENT: No sore throat. Cardiovascular: Positive for chest pain. Respiratory: Positive for shortness of breath. Gastrointestinal: No abdominal pain.  No nausea, no vomiting.  No diarrhea.  No constipation. Genitourinary: Negative for dysuria. Musculoskeletal: Negative for back pain. Skin: Negative for rash. Neurological: Negative for headaches, focal weakness or numbness.  ____________________________________________   PHYSICAL EXAM:  VITAL SIGNS: ED Triage Vitals  Enc Vitals Group     BP      Pulse      Resp      Temp      Temp src      SpO2      Weight      Height      Head Circumference      Peak Flow      Pain Score      Pain Loc      Pain Edu?      Excl. in [place]?     Constitutional: Alert and oriented. Eyes: Conjunctivae are normal. Head: Atraumatic. Nose: No congestion/rhinnorhea. Mouth/Throat: Mucous membranes are moist. Neck: Normal ROM Cardiovascular: Tachycardic, regular rhythm. Grossly normal heart sounds. Respiratory: Mild respiratory distress with tachypnea but no accessory muscle use.  No retractions. Lungs CTAB. Gastrointestinal: Soft and nontender. No distention. Genitourinary: deferred Musculoskeletal: No lower extremity tenderness nor edema. Neurologic:  Normal speech  and language. No gross focal neurologic deficits are appreciated. Skin:  Skin is warm, dry and  intact. No rash noted. Psychiatric: Mood and affect are normal. Speech and behavior are normal.  ____________________________________________   LABS (all labs ordered are listed, but only abnormal results are displayed)  Labs Reviewed  COMPREHENSIVE METABOLIC PANEL - Abnormal; Notable for the following components:      Result Value   BUN 25 (*)    Calcium 8.7 (*)    Total Protein 6.4 (*)    ALT 59 (*)    Alkaline Phosphatase 37 (*)    All other components within normal limits  CBC WITH DIFFERENTIAL/PLATELET - Abnormal; Notable for the following components:   Platelets 144 (*)    Lymphs Abs 0.5 (*)    All other components within normal limits  FIBRIN DERIVATIVES D-DIMER ([facility] ONLY) - Abnormal; Notable for the following components:   Fibrin derivatives D-dimer [facility]) 1,316.54 (*)    All other components within normal limits  FERRITIN - Abnormal; Notable for the following components:   Ferritin 771 (*)    All other components within normal limits  TROPONIN I (HIGH SENSITIVITY) - Abnormal; Notable for the following components:   Troponin I (High Sensitivity) 19 (*)    All other components within normal limits  TROPONIN I (HIGH SENSITIVITY) - Abnormal; Notable for the following components:   Troponin I (High Sensitivity) 27 (*)    All other components within normal limits  CULTURE, BLOOD (ROUTINE X 2)  CULTURE, BLOOD (ROUTINE X 2)  LACTIC ACID, PLASMA  LACTIC ACID, PLASMA  PROCALCITONIN  C-REACTIVE PROTEIN   ____________________________________________  EKG  ED ECG REPORT I, Blake Divine, the attending physician, personally viewed and interpreted this ECG.   Date: 05/30/2019  EKG Time: 9:21  Rate: 121  Rhythm: sinus tachycardia  Axis: LAD  Intervals:left anterior fascicular block  ST&T Change: TWI in aVL    PROCEDURES  Procedure(s) performed (including Critical  Care):  Procedures   ____________________________________________   INITIAL IMPRESSION / ASSESSMENT AND PLAN / ED COURSE       78 year old male with history of CAD status post CABG presenting to the ED for worsening burning chest pain and shortness of breath following recent diagnosis of COVID-19.  He is in mild respiratory distress with tachypnea, noted to have O2 sats of 88% on room air and subsequently placed on 2 L nasal cannula with improvement.  Suspect his chest pain is secondary to COVID-19 given burning and pleuritic.  EKG without acute ischemic changes, will screen troponin.  Would hold off on antibiotics for now as COVID is more likely, screen procalcitonin as well as blood cultures and lactate.  Patient remains in no respiratory distress on 4 L by nasal cannula.  Chest x-ray is consistent with COVID-19 pneumonia, doubt bacterial pneumonia.  Troponin very mildly elevated, doubt ACS given atypical symptoms.  Case discussed with Dr.Krishnan at [facility], who accepts patient for transfer.      ____________________________________________   FINAL CLINICAL IMPRESSION(S) / ED DIAGNOSES  Final diagnoses:  Pneumonia due to COVID-19 virus  Chest pain, unspecified type     ED Discharge Orders    None       Note:  This document was prepared using Dragon voice recognition software and may include unintentional dictation errors.   Blake Divine, MD 05/30/19 1517

## 2019-05-30 NOTE — ED Notes (Signed)
Pt given meal tray and drink.

## 2019-05-30 NOTE — Progress Notes (Signed)
Family Meeting Note  Advance Directive:no  Today a meeting took place with the Patient.  Patient is able to participate.  The following clinical team members were present during this meeting:MD  The following were discussed:Patient's diagnosis: COVID, Patient's progosis: Unable to determine and Goals for treatment: Full Code  Additional follow-up to be provided: prn  Time spent during discussion:20 minutes  Evette Doffing, MD

## 2019-05-30 NOTE — ED Notes (Signed)
Called floor to give report. RN will call back

## 2019-05-31 ENCOUNTER — Other Ambulatory Visit: Payer: Self-pay | Admitting: Internal Medicine

## 2019-05-31 ENCOUNTER — Inpatient Hospital Stay: Admission: AD | Admit: 2019-05-31 | Payer: PPO | Source: Other Acute Inpatient Hospital | Admitting: Internal Medicine

## 2019-05-31 LAB — BASIC METABOLIC PANEL
Anion gap: 10 (ref 5–15)
BUN: 30 mg/dL — ABNORMAL HIGH (ref 8–23)
CO2: 24 mmol/L (ref 22–32)
Calcium: 8.5 mg/dL — ABNORMAL LOW (ref 8.9–10.3)
Chloride: 106 mmol/L (ref 98–111)
Creatinine, Ser: 1.11 mg/dL (ref 0.61–1.24)
GFR calc Af Amer: >60 mL/min (ref >60)
GFR calc non Af Amer: >60 mL/min (ref >60)
Glucose, Bld: 177 mg/dL — ABNORMAL HIGH (ref 70–99)
Potassium: 4.1 mmol/L (ref 3.5–5.1)
Sodium: 140 mmol/L (ref 135–145)

## 2019-05-31 LAB — CBC
HCT: 41.2 % (ref 39.0–52.0)
Hemoglobin: 14.1 g/dL (ref 13.0–17.0)
MCH: 31.2 pg (ref 26.0–34.0)
MCHC: 34.2 g/dL (ref 30.0–36.0)
MCV: 91.2 fL (ref 80.0–100.0)
Platelets: 153 10*3/uL (ref 150–400)
RBC: 4.52 MIL/uL (ref 4.22–5.81)
RDW: 12 % (ref 11.5–15.5)
WBC: 7.9 10*3/uL (ref 4.0–10.5)
nRBC: 0 % (ref 0.0–0.2)

## 2019-05-31 LAB — SARS CORONAVIRUS 2 (TAT 6-24 HRS): SARS Coronavirus 2: POSITIVE — AB

## 2019-05-31 LAB — TROPONIN I (HIGH SENSITIVITY): Troponin I (High Sensitivity): 13 ng/L (ref <18)

## 2019-05-31 MED ORDER — ALUM & MAG HYDROXIDE-SIMETH 200-200-20 MG/5ML PO SUSP
15.0000 mL | Freq: Four times a day (QID) | ORAL | Status: DC | PRN
  Administered 2019-05-31: 15 mL via ORAL
  Filled 2019-05-31: qty 30

## 2019-05-31 MED ORDER — GABAPENTIN 300 MG PO CAPS
300.0000 mg | ORAL_CAPSULE | Freq: Three times a day (TID) | ORAL | Status: DC
  Administered 2019-05-31 – 2019-06-01 (×6): 300 mg via ORAL
  Filled 2019-05-31 (×6): qty 1

## 2019-05-31 MED ORDER — SODIUM CHLORIDE 0.9 % IV SOLN
1.0000 g | INTRAVENOUS | Status: DC
  Administered 2019-05-31 – 2019-06-01 (×2): 1 g via INTRAVENOUS
  Filled 2019-05-31 (×2): qty 1

## 2019-05-31 MED ORDER — SODIUM CHLORIDE 0.9 % IV SOLN
500.0000 mg | INTRAVENOUS | Status: DC
  Administered 2019-05-31 – 2019-06-01 (×2): 500 mg via INTRAVENOUS
  Filled 2019-05-31 (×2): qty 500

## 2019-05-31 NOTE — Progress Notes (Signed)
Pt complaining of heart burn standing orders for PRN Maalox put in. Will continue to monitor.

## 2019-05-31 NOTE — Progress Notes (Signed)
Spoke to [facility] physician Dr.Gherghe,accpted the patient for transfer,he will go there when be is available.I also spoke to patient's wife and explained about transfer to [facility] transfer as that is covid  [facility] speciality hospital.she wants him to go there.pt was not agreeable  in the afternoon but now after talking to wife he is agreeable,

## 2019-05-31 NOTE — Telephone Encounter (Signed)
Noted! Thank you

## 2019-05-31 NOTE — Telephone Encounter (Addendum)
Left message on vm asking pt to call back. Need to get an update on him today.   Fyi, to Dr. Darnell Level, just noticed pt was admitted to [facility] yesterday.

## 2019-05-31 NOTE — Telephone Encounter (Signed)
Returned call from pt.  States he is in [facility].  They were trying to transfer him to [facility] but no openings.  Says he was feeling better than ran a 103.8 fever and tightness in his chest.  Says they have him on O2.  Feels somewhat better today.  Wants to c/x 06/05/19 lab visit.   Lab appt c/x.

## 2019-05-31 NOTE — Discharge Summary (Signed)
[name], is a 78 y.o. male  DOB [birth year]  MRN [id number].  Admission date:  05/30/2019  Admitting Physician  Sela Hua, MD  Discharge Date:  05/31/2019   Primary MD  Ria Bush, MD  Recommendations for primary care physician for things to follow:   Discharge to [facility] when the bed is available   Admission Diagnosis  Chest pain, unspecified type [R07.9] Pneumonia due to COVID-19 virus [U07.1, J12.89]   Discharge Diagnosis  Chest pain, unspecified type [R07.9] Pneumonia due to COVID-19 virus [U07.1, J12.89]    Active Problems:   COVID-19      Past Medical History:  Diagnosis Date  . BENIGN PROSTATIC HYPERTROPHY, WITH URINARY OBSTRUCTION 09/06/2007  . CAD s/p CABG    a. 1990 s/p MI-->CABG x 2; b. 2002 s/p BMS to LCX; c. 06/2015 Cath: LM 50ost, LAD 100ost/p, 54m/d, D2 nl, LCX  patent stent, RCA 80p (small), LIMA->LAD nl, VG->D2 nl-->Med Rx.  . Carotid arterial disease ([place])    a. 08/2016 Carotid U/S: <39% bilat.  . 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 Community acquired pneumonia of right lower lobe of lung 06/05/2017  . 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 DVT, HX OF 1998  . GERD 04/30/2007  . Headache    hx migraines  . History of hiatal hernia   . History of shingles   . HYPERLIPIDEMIA 04/26/2007  . HYPERTENSION 04/30/2007  . INGUINAL HERNIA, RIGHT 05/26/2010  . Laceration of skin of left hand 03/08/2018  . Lumbar disc disease with radiculopathy   . Myocardial infarction ([place])    1989, 2002  . OSA (obstructive sleep apnea)    a. did not tolerate CPAP.  Marland Kitchen Osteoarthritis   . PAD (peripheral artery disease) ([place])    a. 07/2017 LE duplex: RSFA 75-5m, LSFA 75-38m, 50-74d.  . Pneumonia 07/2014   [facility] hospitalization  . Pre-diabetes   . PSA, INCREASED 07/09/2008  .  Spinal stenosis of lumbar region     Past Surgical History:  Procedure Laterality Date  . ABDOMINAL AORTOGRAM W/LOWER EXTREMITY [name] 09/12/2018   Procedure: ABDOMINAL AORTOGRAM W/LOWER EXTREMITY;  Surgeon: [name], MD;  Location: [place] [facility];  Service: Cardiovascular;  Laterality: [name];  . APPENDECTOMY     rupture  . CARDIAC CATHETERIZATION [name] 07/07/2015   Procedure: Left Heart Cath and Cors/Grafts Angiography;  Surgeon: [name], MD;  Location: [place] [facility];  Service: Cardiovascular;  Laterality: [name];  . CATARACT EXTRACTION, BILATERAL    . CERVICAL SPINE SURGERY  12/2016   cervical stenosis [name])  . COLONOSCOPY  03/2010   HP polyp, diverticulosis, rpt 10 yrs ([name])  . CORONARY ARTERY BYPASS GRAFT  1990   3 vessel   . CYSTOSCOPY  12/23/10   [facility]  . EYE SURGERY     cataract  . KNEE ARTHROSCOPY Right    x2  . LAMINOTOMY  1986   L5/S1 lumbar laminotomy for two ruptured discs/fusion  . LUMBAR LAMINECTOMY/DECOMPRESSION MICRODISCECTOMY [name] 07/13/2016   Procedure: LUMBAR TWO-THREE, LUMBAR THREE-FOUR, LUMBAR FOUR-FIVE LAMINECTOMY AND FORAMINOTOMY;  Surgeon: [name], MD;  Location: [place];  Service: Neurosurgery;  Laterality: [name];  LAMINECTOMY AND FORAMINOTOMY L2-L3, L3-L4,L4-L5  . TOTAL HIP ARTHROPLASTY Bilateral 1999,2000  . TOTAL KNEE ARTHROPLASTY Right 03/06/2017   Procedure: RIGHT TOTAL KNEE ARTHROPLASTY;  Surgeon: Gaynelle Arabian, MD;  Location: [facility];  Service: Orthopedics;  Laterality: Right;       History of present illness and  [facility]  Course:     Kindly see H&P for history of present illness and admission details, please review complete Labs, Consult reports and Test reports for all details in brief  HPI  from the history and physical done on the day of admission 78 year old male patient with history of CAD, CABG, hypertension, prediabetes, obstructive sleep apnea came to ER because of shortness of breath, patient had COVID-19 test  done in preparation for back surgery and it came became positive.  Patient is admitted to telemetry because of shortness of breath, hypoxia and requiring oxygen.   Hospital Course   Acute respiratory failure with hypoxia secondary to COVID-19 infection.  Patient COVID-19 test is done on October 5.  Admitted to telemetry, started on IV Solu-Medrol, revisited.  Patient is afebrile, waiting for bed to [facility].  Unable to wean off oxygen, still requiring oxygen 2 L and without oxygen patient saturations are plummeting below 88%.  Patient is maintaining 91% on 2 L.  P patient received Remdesevir,IV steroids.  Patient had slightly elevated CRP of 13.2, elevated procalcitonin 0.17, ferritin 771, patient CBC, electrolytes are otherwise unremarkable. #2 history of hypertension: Stable 3.  History of hyperlipidemia: Continue statins History of CAD, no chest pain, continue aspirin, statins, Imdur, #4 prediabetes, history of neuropathy: Continue Neurontin.  Discharge Condition: Stable   Follow UP      Discharge Instructions  and  Discharge Medications      Allergies as of 05/31/2019      Reactions   Vioxx [rofecoxib] Other (See Comments)   Hemorrhage    Contrast Media [iodinated Diagnostic Agents] Itching, Rash   Delayed reaction post abdominal aortagram.    Morphine Nausea Only, Other (See Comments)   Irritability      Medication List    TAKE these medications   acetaminophen 650 MG CR tablet Commonly known as: TYLENOL Take 1,300 mg by mouth every 8 (eight) hours as needed for pain.   albuterol 108 (90 Base) MCG/ACT inhaler Commonly known as: VENTOLIN HFA Inhale 2 puffs into the lungs every 6 (six) hours as needed for wheezing or shortness of breath.   aspirin EC 81 MG tablet Take 1 tablet (81 mg total) by mouth daily.   azithromycin 250 MG tablet Commonly known as: Zithromax Take 2 tablets PO on day 1, then take 1 tablet PO daily for 4 more days   B-12 1000 MCG  Subl Place 1 tablet under the tongue daily. What changed: when to take this   ezetimibe 10 MG tablet Commonly known as: ZETIA TAKE ONE TABLET BY MOUTH EVERY DAY What changed: when to take this   gabapentin 300 MG capsule Commonly known as: NEURONTIN Take 300 mg by mouth 3 (three) times daily.   HYDROcodone-homatropine 5-1.5 MG/5ML syrup Commonly known as: HYCODAN Take 5 mLs by mouth every 6 (six) hours as needed for cough.   isosorbide mononitrate 30 MG 24 hr tablet Commonly known as: IMDUR Take 1 tablet (30 mg total) by mouth 2 (two) times a day.   ketoconazole 2 % cream Commonly known as: NIZORAL Apply 1 application topically daily as needed for irritation.   nitroGLYCERIN 0.4 MG SL tablet Commonly known as: NITROSTAT Place 1 tablet (0.4 mg total) under the tongue every 5 (five) minutes as needed for chest pain.   polyethylene glycol 17 g packet Commonly known as: MIRALAX / GLYCOLAX Take 17 g by mouth every other day.   predniSONE 10 MG tablet Commonly known as: DELTASONE Take 4 tabs (  40 mg) PO x 3 days, then take 2 tabs (20 mg) PO x 3 days, then take 1 tab (10 mg) PO x 3 days, then take 1/2 tab (5 mg) PO x 4 days.   ranolazine 500 MG 12 hr tablet Commonly known as: RANEXA Take 1 tablet (500 mg total) by mouth 2 (two) times daily for 14 days.   simvastatin 40 MG tablet Commonly known as: ZOCOR TAKE ONE TABLET BY MOUTH EVERY DAY What changed: when to take this   tolterodine 4 MG 24 hr capsule Commonly known as: DETROL LA Take 4 mg by mouth daily.         Diet and Activity recommendation: See Discharge Instructions above   Consults obtained -none   Major procedures and Radiology Reports - PLEASE review detailed and final reports for all details, in brief -      Dg Chest Portable 1 View  Result Date: 05/30/2019 CLINICAL DATA:  Shortness of breath, fever. EXAM: PORTABLE CHEST 1 VIEW COMPARISON:  May 28, 2019. FINDINGS: The heart size and  mediastinal contours are within normal limits. No pneumothorax or pleural effusion is noted. Increased left midlung and basilar opacities are noted suggesting worsening atelectasis or pneumonia. Stable right lung atelectasis or inflammation. The visualized skeletal structures are unremarkable. IMPRESSION: Increased left lung opacity is noted suggesting worsening atelectasis or pneumonia. Stable right lung atelectasis or inflammation is noted. Electronically Signed   By: Marijo Conception M.D.   On: 05/30/2019 09:54   Dg Chest Portable 1 View  Result Date: 05/28/2019 CLINICAL DATA:  COVID positive.  Fever, fatigue EXAM: PORTABLE CHEST 1 VIEW COMPARISON:  05/24/2019 FINDINGS: Streaky opacities at the left base, slightly increased since prior study. Improved aeration at the right base with no confluent opacity currently. Heart is normal size. No effusions or acute bony abnormality. IMPRESSION: Increasing streaky opacity at the left lung base with improving aeration at the right base. Electronically Signed   By: Rolm Baptise M.D.   On: 05/28/2019 09:33   Dg Chest Portable 1 View  Result Date: 05/24/2019 CLINICAL DATA:  Covid positive shortness of breath EXAM: PORTABLE CHEST 1 VIEW COMPARISON:  June 05, 2017 FINDINGS: The heart size and mediastinal contours are within normal limits. Overlying median sternotomy wires are present. Mildly increased streaky opacity seen at the right lung base. The visualized skeletal structures are unremarkable. IMPRESSION: Mildly increased streaky opacity at the right lung base. This is non-specific could be due to atelectasis and/or early infectious etiology. Electronically Signed   By: Prudencio Pair M.D.   On: 05/24/2019 01:05    Micro Results     Recent Results (from the past 240 hour(s))  Culture, blood (routine x 2)     Status: None (Preliminary result)   Collection Time: 05/30/19  9:44 AM   Specimen: BLOOD  Result Value Ref Range Status   Specimen Description  BLOOD LEFT ARM  Final   Special Requests   Final    BOTTLES DRAWN AEROBIC AND ANAEROBIC Blood Culture adequate volume   Culture   Final    NO GROWTH < 24 HOURS Performed at [facility], [address] [place], [place] [address]    Report Status PENDING  Incomplete  Culture, blood (routine x 2)     Status: None (Preliminary result)   Collection Time: 05/30/19  9:44 AM   Specimen: BLOOD  Result Value Ref Range Status   Specimen Description BLOOD RIGHT HAND  Final   Special Requests  Final    BOTTLES DRAWN AEROBIC AND ANAEROBIC Blood Culture adequate volume   Culture   Final    NO GROWTH < 24 HOURS Performed at [facility], [address] [place], [place] [address]    Report Status PENDING  Incomplete       Today   Subjective:   [name] today has no shortness of breath or chest pain.  Waiting for bed pain at [facility] Objective:   Blood pressure 137/71, pulse 95, temperature 97.8 F (36.6 C), temperature source Oral, resp. rate 18, height 6' (1.829 m), weight 95.3 kg, SpO2 91 %.   Intake/Output Summary (Last 24 hours) at 05/31/2019 1313 Last data filed at 05/31/2019 1021 Gross per 24 hour  Intake -  Output 540 ml  Net -540 ml    Exam Awake Alert, Oriented x 3, No new F.N deficits, Normal affect [place].AT,PERRAL Supple Neck,No JVD, No cervical lymphadenopathy appriciated.  Symmetrical Chest wall movement, Good air movement bilaterally, CTAB RRR,No Gallops,Rubs or new Murmurs, No Parasternal Heave +ve B.Sounds, Abd Soft, Non tender, No organomegaly appriciated, No rebound -guarding or rigidity. No Cyanosis, Clubbing or edema, No new Rash or bruise  Data Review   CBC w Diff:  Lab Results  Component Value Date   WBC 7.9 05/31/2019   HGB 14.1 05/31/2019   HGB 14.0 08/31/2018   HCT 41.2 05/31/2019   HCT 39.5 08/31/2018   PLT 153 05/31/2019   PLT 181 08/31/2018   LYMPHOPCT 11 05/30/2019   MONOPCT 6 05/30/2019   EOSPCT 0  05/30/2019   BASOPCT 0 05/30/2019    CMP:  Lab Results  Component Value Date   NA 140 05/31/2019   NA 144 08/31/2018   NA 138 08/10/2014   K 4.1 05/31/2019   K 4.2 08/10/2014   CL 106 05/31/2019   CL 104 08/10/2014   CO2 24 05/31/2019   CO2 25 08/10/2014   BUN 30 (H) 05/31/2019   BUN 19 08/31/2018   BUN 25 (H) 08/10/2014   CREATININE 1.11 05/31/2019   CREATININE 1.26 08/10/2014   PROT 6.4 (L) 05/30/2019   PROT 6.5 05/14/2013   ALBUMIN 3.5 05/30/2019   ALBUMIN 3.6 05/14/2013   BILITOT 0.6 05/30/2019   BILITOT 0.3 05/14/2013   ALKPHOS 37 (L) 05/30/2019   ALKPHOS 48 (L) 05/14/2013   AST 37 05/30/2019   AST 24 05/14/2013   ALT 59 (H) 05/30/2019   ALT 20 05/14/2013  .   Total Time in preparing paper work, data evaluation and todays exam - 69 minutes  Epifanio Lesches M.D on 05/31/2019 at 1:13 PM    Note: This dictation was prepared with Dragon dictation along with smaller phrase technology. Any transcriptional errors that result from this process are unintentional.

## 2019-05-31 NOTE — Progress Notes (Signed)
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

## 2019-05-31 NOTE — Progress Notes (Signed)
[place] at [place] NAME: [name]    MR#:  [id number]  DATE OF BIRTH:  [birth year]  SUBJECTIVE: Denies chest pain.  Does have some cough, shortness of breath, COVID-19 is positive.  Patient still on no oxygen 2 L and saturation is only 91%.  CHIEF COMPLAINT:   Chief Complaint  Patient presents with  . Shortness of Breath  Fever up to 103 Fahrenheit at home.  aFebrile now but has cough.  REVIEW OF SYSTEMS:   ROS CONSTITUTIONAL: No fever, fatigue or weakness.  EYES: No blurred or double vision.  EARS, NOSE, AND THROAT: No tinnitus or ear pain.  RESPIRATORY: Has some cough, requiring oxygen but denies shortness of breath. CARDIOVASCULAR: No chest pain, orthopnea, edema.  GASTROINTESTINAL: No nausea, vomiting, diarrhea or abdominal pain.  GENITOURINARY: No dysuria, hematuria.  ENDOCRINE: No polyuria, nocturia,  HEMATOLOGY: No anemia, easy bruising or bleeding SKIN: No rash or lesion. MUSCULOSKELETAL: No joint pain or arthritis.   NEUROLOGIC: No tingling, numbness, weakness.  PSYCHIATRY: No anxiety or depression.   DRUG ALLERGIES:   Allergies  Allergen Reactions  . Vioxx [Rofecoxib] Other (See Comments)    Hemorrhage   . Contrast Media [Iodinated Diagnostic Agents] Itching and Rash    Delayed reaction post abdominal aortagram.   . Morphine Nausea Only and Other (See Comments)    Irritability     VITALS:  Blood pressure 126/71, pulse 91, temperature 98 F (36.7 C), temperature source Oral, resp. rate 18, height 6' (1.829 m), weight 95.3 kg, SpO2 93 %.  PHYSICAL EXAMINATION:  GENERAL:  78 y.o.-year-old patient lying in the bed with no acute distress.  EYES: Pupils equal, round, reactive to light and accommodation. No scleral icterus. Extraocular muscles intact.  HEENT: Head atraumatic, normocephalic. Oropharynx and nasopharynx clear.  NECK:  Supple, no jugular venous distention. No thyroid enlargement, no  tenderness.  LUNGS: Normal breath sounds bilaterally, no wheezing, rales,rhonchi or crepitation. No use of accessory muscles of respiration.  CARDIOVASCULAR: S1, S2 normal. No murmurs, rubs, or gallops.  ABDOMEN: Soft, nontender, nondistended. Bowel sounds present. No organomegaly or mass.  EXTREMITIES: No pedal edema, cyanosis, or clubbing.  NEUROLOGIC: Cranial nerves II through XII are intact. Muscle strength 5/5 in all extremities. Sensation intact. Gait not checked.  PSYCHIATRIC: The patient is alert and oriented x 3.  SKIN: No obvious rash, lesion, or ulcer.    LABORATORY PANEL:   CBC Recent Labs  Lab 05/31/19 0711  WBC 7.9  HGB 14.1  HCT 41.2  PLT 153   ------------------------------------------------------------------------------------------------------------------  Chemistries  Recent Labs  Lab 05/30/19 0943 05/31/19 0711  NA 138 140  K 4.0 4.1  CL 102 106  CO2 23 24  GLUCOSE 93 177*  BUN 25* 30*  CREATININE 1.15 1.11  CALCIUM 8.7* 8.5*  AST 37  --   ALT 59*  --   ALKPHOS 37*  --   BILITOT 0.6  --    ------------------------------------------------------------------------------------------------------------------  Cardiac Enzymes No results for input(s): TROPONINI in the last 168 hours. ------------------------------------------------------------------------------------------------------------------  RADIOLOGY:  Dg Chest Portable 1 View  Result Date: 05/30/2019 CLINICAL DATA:  Shortness of breath, fever. EXAM: PORTABLE CHEST 1 VIEW COMPARISON:  May 28, 2019. FINDINGS: The heart size and mediastinal contours are within normal limits. No pneumothorax or pleural effusion is noted. Increased left midlung and basilar opacities are noted suggesting worsening atelectasis or pneumonia. Stable right lung atelectasis or inflammation. The visualized skeletal structures are unremarkable. IMPRESSION: Increased  left lung opacity is noted suggesting worsening  atelectasis or pneumonia. Stable right lung atelectasis or inflammation is noted. Electronically Signed   By: Marijo Conception M.D.   On: 05/30/2019 09:54    EKG:   Orders placed or performed during the hospital encounter of 05/30/19  . ED EKG  . ED EKG    ASSESSMENT AND PLAN:    78 yr old with history of CAD, CABG, carotid artery disease, hypertension, hyperlipidemia, prediabetes had been waiting for back surgery and he has been tested positive for COVID as preop testing.   COVID-19 infection with hypoxia, fever at home, patient is requiring oxygen 2 L and saturating 91%.;  On Remedesevir., Decadron, oxygen chest x-ray showed opacities in the left lung suggesting of atelectasis and pneumonia.  IV antibiotics.  Continue albuterol inhaler, steroids, patient daughter is working diagnosis: And patient denies chest pain patient will go to [facility] when the bed is available.  All the records are reviewed and case discussed with Care Management/Social Workerr. Management plans discussed with the patient, family and they are in agreement.  CODE STATUS: Full code  TOTAL TIME TAKING CARE OF THIS PATIENT: 44minutes.   POSSIBLE D/C IN 1-2DAYS, DEPENDING ON CLINICAL CONDITION.   Epifanio Lesches M.D on 05/31/2019 at 3:01 PM  Between 7am to 6pm - Pager - [phone]  After 6pm go to [email or web address] - password [email or web address] [facility]  Office  [phone]  CC: Primary care physician; Ria Bush, MD   Note: This dictation was prepared with Dragon dictation along with smaller phrase technology. Any transcriptional errors that result from this process are unintentional.

## 2019-05-31 NOTE — Progress Notes (Signed)
Talked to Dr. Vianne Bulls and she say that patient is transferring to [facility]. Covid test from today did came back positive. I inform the patient about the transfer, he is agreeable about the situation, awaiting for bed availability. MD also talk to the wife and inform about the plan of care for this patient. Rn will continue to monitor.

## 2019-06-01 MED ORDER — ALBUTEROL SULFATE HFA 108 (90 BASE) MCG/ACT IN AERS
2.0000 | INHALATION_SPRAY | Freq: Four times a day (QID) | RESPIRATORY_TRACT | Status: DC | PRN
  Administered 2019-06-01 – 2019-06-02 (×3): 2 via RESPIRATORY_TRACT
  Filled 2019-06-01: qty 6.7

## 2019-06-01 NOTE — Progress Notes (Signed)
[place] at [place] NAME: [name]    MR#:  [id number]  DATE OF BIRTH:  Dec 01, 1940  SUBJECTIVE:   Patient presented to the hospital due to shortness of breath and noted to have pneumonia secondary to COVID-19.  Patient still complaining of some chest pain and exertional dyspnea.  Intermittently tachycardic and gets hypoxic on minimal exertion.  REVIEW OF SYSTEMS:    Review of Systems  Constitutional: Negative for chills and fever.  HENT: Negative for congestion and tinnitus.   Eyes: Negative for blurred vision and double vision.  Respiratory: Positive for cough and shortness of breath. Negative for wheezing.   Cardiovascular: Positive for chest pain. Negative for orthopnea and PND.  Gastrointestinal: Negative for abdominal pain, diarrhea, nausea and vomiting.  Genitourinary: Negative for dysuria and hematuria.  Neurological: Negative for dizziness, sensory change and focal weakness.  All other systems reviewed and are negative.   Nutrition: Heart Healthy Tolerating Diet: Yes Tolerating PT: Ambulatory  DRUG ALLERGIES:   Allergies  Allergen Reactions  . Vioxx [Rofecoxib] Other (See Comments)    Hemorrhage   . Contrast Media [Iodinated Diagnostic Agents] Itching and Rash    Delayed reaction post abdominal aortagram.   . Morphine Nausea Only and Other (See Comments)    Irritability     VITALS:  Blood pressure (!) 147/63, pulse 90, temperature 97.7 F (36.5 C), temperature source Oral, resp. rate 20, height 6' (1.829 m), weight 92.7 kg, SpO2 94 %.  PHYSICAL EXAMINATION:   Physical Exam  GENERAL:  78 y.o.-year-old patient lying in bed in no acute distress.  EYES: Pupils equal, round, reactive to light and accommodation. No scleral icterus. Extraocular muscles intact.  HEENT: Head atraumatic, normocephalic. Oropharynx and nasopharynx clear.  NECK:  Supple, no jugular venous distention. No thyroid enlargement, no  tenderness.  LUNGS: Normal breath sounds bilaterally, no wheezing, rales, rhonchi. No use of accessory muscles of respiration.  CARDIOVASCULAR: S1, S2 normal. No murmurs, rubs, or gallops.  ABDOMEN: Soft, nontender, nondistended. Bowel sounds present. No organomegaly or mass.  EXTREMITIES: No cyanosis, clubbing or edema b/l.    NEUROLOGIC: Cranial nerves II through XII are intact. No focal Motor or sensory deficits b/l.   PSYCHIATRIC: The patient is alert and oriented x 3.  SKIN: No obvious rash, lesion, or ulcer.    LABORATORY PANEL:   CBC Recent Labs  Lab 05/31/19 0711  WBC 7.9  HGB 14.1  HCT 41.2  PLT 153   ------------------------------------------------------------------------------------------------------------------  Chemistries  Recent Labs  Lab 05/30/19 0943 05/31/19 0711  NA 138 140  K 4.0 4.1  CL 102 106  CO2 23 24  GLUCOSE 93 177*  BUN 25* 30*  CREATININE 1.15 1.11  CALCIUM 8.7* 8.5*  AST 37  --   ALT 59*  --   ALKPHOS 37*  --   BILITOT 0.6  --    ------------------------------------------------------------------------------------------------------------------  Cardiac Enzymes No results for input(s): TROPONINI in the last 168 hours. ------------------------------------------------------------------------------------------------------------------  RADIOLOGY:  No results found.   ASSESSMENT AND PLAN:   78 year old male with past medical history of peripheral artery disease, hypertension, hyperlipidemia, previous history of MI, prediabetes, history of coronary disease status post bypass who presented to the hospital due to shortness of breath and chest pain noted to have pneumonia.  1.  COVID-19 pneumonia-patient presented to the hospital with shortness of breath and chest pain.  Patient's COVID-19 test is positive.  His chest x-ray did show an infiltrate. -  Continue O2 supplementation, IV remdesivir, IV Decadron. -Continue supportive care.  Continue  incentive spirometry.  2.  Acute respiratory failure with hypoxia-secondary to COVID-19 pneumonia. -Continue O2 supplementation, tolerate O2 sats 90% or higher.  Continue treatment as mentioned above.  3.  Neuropathy-continue gabapentin.  4.  Essential hypertension-continue Imdur.  5.  Hyperlipidemia-continue simvastatin.   All the records are reviewed and case discussed with Care Management/Social Worker. Management plans discussed with the patient, family and they are in agreement.  CODE STATUS: Full code  DVT Prophylaxis: Lovenox  TOTAL TIME TAKING CARE OF THIS PATIENT: 30 minutes.   POSSIBLE D/C IN 1-2 DAYS, DEPENDING ON CLINICAL CONDITION.   Henreitta Leber M.D on 06/01/2019 at 1:44 PM  Between 7am to 6pm - Pager - [phone]  After 6pm go to [email or web address] - Proofreader  [facility]  Office  [phone]  CC: Primary care physician; Ria Bush, MD

## 2019-06-01 NOTE — Progress Notes (Signed)
Patient is alert and oriented x 4. Patient's Oxygen increased to 5 L Pt's O2 maintaining 88-90 % Pt denies feeling SOB or pain. Patient was ambulated in the room and he was able to sit at least 3 hrs in the chair, tolerated well.

## 2019-06-01 NOTE — Plan of Care (Signed)
  Problem: Education: Goal: Knowledge of General Education information will improve Description: Including pain rating scale, medication(s)/side effects and non-pharmacologic comfort measures Outcome: Progressing   Problem: Clinical Measurements: Goal: Will remain free from infection Outcome: Progressing Goal: Respiratory complications will improve Outcome: Progressing   

## 2019-06-01 NOTE — Progress Notes (Signed)
Report called to Lelon Frohlich, RN at [facility] and all questions/concerns addressed. Patient updated on plan for transport and [facility] to be notified that patient is ready to go.   Earleen Reaper, RN

## 2019-06-02 ENCOUNTER — Other Ambulatory Visit: Payer: Self-pay

## 2019-06-02 ENCOUNTER — Encounter (HOSPITAL_COMMUNITY): Payer: Self-pay

## 2019-06-02 ENCOUNTER — Inpatient Hospital Stay (HOSPITAL_COMMUNITY): Payer: PPO

## 2019-06-02 ENCOUNTER — Inpatient Hospital Stay (HOSPITAL_COMMUNITY)
Admission: AD | Admit: 2019-06-02 | Discharge: 2019-06-09 | DRG: 177 | Disposition: A | Discharge status: Home Health | Payer: PPO | Source: Other Acute Inpatient Hospital | Attending: Internal Medicine | Admitting: Internal Medicine

## 2019-06-02 DIAGNOSIS — J069 Acute upper respiratory infection, unspecified: Secondary | ICD-10-CM

## 2019-06-02 DIAGNOSIS — Z96643 Presence of artificial hip joint, bilateral: Secondary | ICD-10-CM | POA: Diagnosis present

## 2019-06-02 DIAGNOSIS — Z96651 Presence of right artificial knee joint: Secondary | ICD-10-CM | POA: Diagnosis present

## 2019-06-02 DIAGNOSIS — U071 COVID-19: Secondary | ICD-10-CM

## 2019-06-02 DIAGNOSIS — Z802 Family history of malignant neoplasm of other respiratory and intrathoracic organs: Secondary | ICD-10-CM | POA: Diagnosis not present

## 2019-06-02 DIAGNOSIS — F419 Anxiety disorder, unspecified: Secondary | ICD-10-CM | POA: Diagnosis not present

## 2019-06-02 DIAGNOSIS — J1282 Pneumonia due to coronavirus disease 2019: Secondary | ICD-10-CM | POA: Diagnosis present

## 2019-06-02 DIAGNOSIS — I10 Essential (primary) hypertension: Secondary | ICD-10-CM | POA: Diagnosis present

## 2019-06-02 DIAGNOSIS — R079 Chest pain, unspecified: Secondary | ICD-10-CM | POA: Diagnosis not present

## 2019-06-02 DIAGNOSIS — Z833 Family history of diabetes mellitus: Secondary | ICD-10-CM

## 2019-06-02 DIAGNOSIS — N400 Enlarged prostate without lower urinary tract symptoms: Secondary | ICD-10-CM | POA: Diagnosis present

## 2019-06-02 DIAGNOSIS — Z8249 Family history of ischemic heart disease and other diseases of the circulatory system: Secondary | ICD-10-CM

## 2019-06-02 DIAGNOSIS — I252 Old myocardial infarction: Secondary | ICD-10-CM

## 2019-06-02 DIAGNOSIS — Z885 Allergy status to narcotic agent status: Secondary | ICD-10-CM | POA: Diagnosis not present

## 2019-06-02 DIAGNOSIS — J1289 Other viral pneumonia: Secondary | ICD-10-CM | POA: Diagnosis not present

## 2019-06-02 DIAGNOSIS — Z881 Allergy status to other antibiotic agents status: Secondary | ICD-10-CM

## 2019-06-02 DIAGNOSIS — K219 Gastro-esophageal reflux disease without esophagitis: Secondary | ICD-10-CM | POA: Diagnosis present

## 2019-06-02 DIAGNOSIS — Z87891 Personal history of nicotine dependence: Secondary | ICD-10-CM

## 2019-06-02 DIAGNOSIS — Z841 Family history of disorders of kidney and ureter: Secondary | ICD-10-CM

## 2019-06-02 DIAGNOSIS — I2510 Atherosclerotic heart disease of native coronary artery without angina pectoris: Secondary | ICD-10-CM | POA: Diagnosis present

## 2019-06-02 DIAGNOSIS — R7303 Prediabetes: Secondary | ICD-10-CM | POA: Diagnosis not present

## 2019-06-02 DIAGNOSIS — I6523 Occlusion and stenosis of bilateral carotid arteries: Secondary | ICD-10-CM | POA: Diagnosis not present

## 2019-06-02 DIAGNOSIS — M4802 Spinal stenosis, cervical region: Secondary | ICD-10-CM | POA: Diagnosis present

## 2019-06-02 DIAGNOSIS — Z823 Family history of stroke: Secondary | ICD-10-CM

## 2019-06-02 DIAGNOSIS — Z951 Presence of aortocoronary bypass graft: Secondary | ICD-10-CM

## 2019-06-02 DIAGNOSIS — R0602 Shortness of breath: Secondary | ICD-10-CM | POA: Diagnosis present

## 2019-06-02 DIAGNOSIS — G4733 Obstructive sleep apnea (adult) (pediatric): Secondary | ICD-10-CM | POA: Diagnosis present

## 2019-06-02 DIAGNOSIS — I739 Peripheral vascular disease, unspecified: Secondary | ICD-10-CM | POA: Diagnosis not present

## 2019-06-02 DIAGNOSIS — Z86718 Personal history of other venous thrombosis and embolism: Secondary | ICD-10-CM

## 2019-06-02 DIAGNOSIS — R739 Hyperglycemia, unspecified: Secondary | ICD-10-CM | POA: Diagnosis present

## 2019-06-02 DIAGNOSIS — Z91041 Radiographic dye allergy status: Secondary | ICD-10-CM | POA: Diagnosis not present

## 2019-06-02 DIAGNOSIS — J9600 Acute respiratory failure, unspecified whether with hypoxia or hypercapnia: Secondary | ICD-10-CM | POA: Diagnosis present

## 2019-06-02 DIAGNOSIS — I25118 Atherosclerotic heart disease of native coronary artery with other forms of angina pectoris: Secondary | ICD-10-CM

## 2019-06-02 DIAGNOSIS — E785 Hyperlipidemia, unspecified: Secondary | ICD-10-CM | POA: Diagnosis present

## 2019-06-02 DIAGNOSIS — I6529 Occlusion and stenosis of unspecified carotid artery: Secondary | ICD-10-CM | POA: Diagnosis present

## 2019-06-02 HISTORY — DX: COVID-19: U07.1

## 2019-06-02 LAB — CBC WITH DIFFERENTIAL/PLATELET
Abs Immature Granulocytes: 0.11 10*3/uL — ABNORMAL HIGH (ref 0.00–0.07)
Basophils Absolute: 0 10*3/uL (ref 0.0–0.1)
Basophils Relative: 0 %
Eosinophils Absolute: 0 10*3/uL (ref 0.0–0.5)
Eosinophils Relative: 0 %
HCT: 38.7 % — ABNORMAL LOW (ref 39.0–52.0)
Hemoglobin: 13.4 g/dL (ref 13.0–17.0)
Immature Granulocytes: 1 %
Lymphocytes Relative: 4 %
Lymphs Abs: 0.4 10*3/uL — ABNORMAL LOW (ref 0.7–4.0)
MCH: 32.4 pg (ref 26.0–34.0)
MCHC: 34.6 g/dL (ref 30.0–36.0)
MCV: 93.5 fL (ref 80.0–100.0)
Monocytes Absolute: 0.3 10*3/uL (ref 0.1–1.0)
Monocytes Relative: 3 %
Neutro Abs: 8.7 10*3/uL — ABNORMAL HIGH (ref 1.7–7.7)
Neutrophils Relative %: 92 %
Platelets: 186 10*3/uL (ref 150–400)
RBC: 4.14 MIL/uL — ABNORMAL LOW (ref 4.22–5.81)
RDW: 12.6 % (ref 11.5–15.5)
WBC: 9.6 10*3/uL (ref 4.0–10.5)
nRBC: 0 % (ref 0.0–0.2)

## 2019-06-02 LAB — COMPREHENSIVE METABOLIC PANEL
ALT: 63 U/L — ABNORMAL HIGH (ref 0–44)
AST: 44 U/L — ABNORMAL HIGH (ref 15–41)
Albumin: 2.9 g/dL — ABNORMAL LOW (ref 3.5–5.0)
Alkaline Phosphatase: 43 U/L (ref 38–126)
Anion gap: 13 (ref 5–15)
BUN: 33 mg/dL — ABNORMAL HIGH (ref 8–23)
CO2: 20 mmol/L — ABNORMAL LOW (ref 22–32)
Calcium: 8.2 mg/dL — ABNORMAL LOW (ref 8.9–10.3)
Chloride: 105 mmol/L (ref 98–111)
Creatinine, Ser: 1.08 mg/dL (ref 0.61–1.24)
GFR calc Af Amer: >60 mL/min (ref >60)
GFR calc non Af Amer: >60 mL/min (ref >60)
Glucose, Bld: 175 mg/dL — ABNORMAL HIGH (ref 70–99)
Potassium: 4.4 mmol/L (ref 3.5–5.1)
Sodium: 138 mmol/L (ref 135–145)
Total Bilirubin: 0.4 mg/dL (ref 0.3–1.2)
Total Protein: 6 g/dL — ABNORMAL LOW (ref 6.5–8.1)

## 2019-06-02 LAB — GLUCOSE, CAPILLARY
Glucose-Capillary: 237 mg/dL — ABNORMAL HIGH (ref 70–99)
Glucose-Capillary: 245 mg/dL — ABNORMAL HIGH (ref 70–99)
Glucose-Capillary: 210 mg/dL — ABNORMAL HIGH (ref 70–99)

## 2019-06-02 LAB — ABO/RH: ABO/RH(D): A NEG

## 2019-06-02 LAB — D-DIMER, QUANTITATIVE: D-Dimer, Quant: 0.73 ug/mL-FEU — ABNORMAL HIGH (ref 0.00–0.50)

## 2019-06-02 LAB — C-REACTIVE PROTEIN: CRP: 4.7 mg/dL — ABNORMAL HIGH (ref <1.0)

## 2019-06-02 LAB — TROPONIN I (HIGH SENSITIVITY): Troponin I (High Sensitivity): 10 ng/L (ref <18)

## 2019-06-02 MED ORDER — SODIUM CHLORIDE 0.9% FLUSH
3.0000 mL | Freq: Two times a day (BID) | INTRAVENOUS | Status: DC
  Administered 2019-06-02 – 2019-06-09 (×13): 3 mL via INTRAVENOUS

## 2019-06-02 MED ORDER — METHYLPREDNISOLONE SODIUM SUCC 40 MG IJ SOLR: 40.0000 mg | Freq: Two times a day (BID) | INTRAMUSCULAR | Status: DC

## 2019-06-02 MED ORDER — SODIUM CHLORIDE 0.9 % IV SOLN
100.0000 mg | INTRAVENOUS | Status: AC
  Administered 2019-06-02 – 2019-06-03 (×2): 100 mg via INTRAVENOUS
  Filled 2019-06-02 (×2): qty 20

## 2019-06-02 MED ORDER — SIMVASTATIN 20 MG PO TABS
40.0000 mg | ORAL_TABLET | Freq: Every day | ORAL | Status: DC
  Administered 2019-06-02 – 2019-06-08 (×7): 40 mg via ORAL
  Filled 2019-06-02: qty 2
  Filled 2019-06-02: qty 1
  Filled 2019-06-02 (×2): qty 2
  Filled 2019-06-02: qty 1
  Filled 2019-06-02 (×3): qty 2

## 2019-06-02 MED ORDER — ENOXAPARIN SODIUM 40 MG/0.4ML ~~LOC~~ SOLN
40.0000 mg | Freq: Two times a day (BID) | SUBCUTANEOUS | Status: DC
  Administered 2019-06-02 – 2019-06-09 (×15): 40 mg via SUBCUTANEOUS
  Filled 2019-06-02 (×15): qty 0.4

## 2019-06-02 MED ORDER — ORAL CARE MOUTH RINSE
15.0000 mL | Freq: Two times a day (BID) | OROMUCOSAL | Status: DC
  Administered 2019-06-02 – 2019-06-07 (×11): 15 mL via OROMUCOSAL

## 2019-06-02 MED ORDER — ZINC SULFATE 220 (50 ZN) MG PO CAPS
220.0000 mg | ORAL_CAPSULE | Freq: Every day | ORAL | Status: DC
  Administered 2019-06-02 – 2019-06-09 (×8): 220 mg via ORAL
  Filled 2019-06-02 (×8): qty 1

## 2019-06-02 MED ORDER — SODIUM CHLORIDE 0.9% FLUSH: 3.0000 mL | INTRAVENOUS | Status: DC | PRN

## 2019-06-02 MED ORDER — EZETIMIBE 10 MG PO TABS
10.0000 mg | ORAL_TABLET | Freq: Every day | ORAL | Status: DC
  Administered 2019-06-02 – 2019-06-08 (×7): 10 mg via ORAL
  Filled 2019-06-02 (×9): qty 1

## 2019-06-02 MED ORDER — ALBUTEROL SULFATE HFA 108 (90 BASE) MCG/ACT IN AERS
2.0000 | INHALATION_SPRAY | Freq: Four times a day (QID) | RESPIRATORY_TRACT | Status: DC | PRN
  Administered 2019-06-02: 2 via RESPIRATORY_TRACT
  Filled 2019-06-02: qty 6.7

## 2019-06-02 MED ORDER — INSULIN ASPART 100 UNIT/ML ~~LOC~~ SOLN
0.0000 [IU] | Freq: Three times a day (TID) | SUBCUTANEOUS | Status: DC
  Administered 2019-06-02 – 2019-06-03 (×3): 3 [IU] via SUBCUTANEOUS

## 2019-06-02 MED ORDER — ALBUTEROL SULFATE HFA 108 (90 BASE) MCG/ACT IN AERS
4.0000 | INHALATION_SPRAY | Freq: Four times a day (QID) | RESPIRATORY_TRACT | Status: DC | PRN
  Filled 2019-06-02: qty 6.7

## 2019-06-02 MED ORDER — NITROGLYCERIN 0.4 MG SL SUBL
SUBLINGUAL_TABLET | SUBLINGUAL | Status: AC
  Filled 2019-06-02: qty 1

## 2019-06-02 MED ORDER — SODIUM CHLORIDE 0.9 % IV SOLN
INTRAVENOUS | Status: DC
  Administered 2019-06-02 (×2): via INTRAVENOUS

## 2019-06-02 MED ORDER — NITROGLYCERIN 0.4 MG SL SUBL: 0.4000 mg | SUBLINGUAL_TABLET | SUBLINGUAL | Status: DC | PRN

## 2019-06-02 MED ORDER — VITAMIN C 500 MG PO TABS
500.0000 mg | ORAL_TABLET | Freq: Every day | ORAL | Status: DC
  Administered 2019-06-02 – 2019-06-09 (×8): 500 mg via ORAL
  Filled 2019-06-02 (×8): qty 1

## 2019-06-02 MED ORDER — CHLORHEXIDINE GLUCONATE CLOTH 2 % EX PADS
6.0000 | MEDICATED_PAD | Freq: Every day | CUTANEOUS | Status: DC
  Administered 2019-06-03 – 2019-06-09 (×4): 6 via TOPICAL

## 2019-06-02 MED ORDER — FESOTERODINE FUMARATE ER 4 MG PO TB24
4.0000 mg | ORAL_TABLET | Freq: Every day | ORAL | Status: DC
  Administered 2019-06-02 – 2019-06-09 (×8): 4 mg via ORAL
  Filled 2019-06-02 (×11): qty 1

## 2019-06-02 MED ORDER — GUAIFENESIN-DM 100-10 MG/5ML PO SYRP: 10.0000 mL | ORAL_SOLUTION | ORAL | Status: DC | PRN

## 2019-06-02 MED ORDER — ISOSORBIDE MONONITRATE ER 30 MG PO TB24
30.0000 mg | ORAL_TABLET | Freq: Every day | ORAL | Status: DC
  Administered 2019-06-02 – 2019-06-09 (×8): 30 mg via ORAL
  Filled 2019-06-02 (×9): qty 1

## 2019-06-02 MED ORDER — GABAPENTIN 300 MG PO CAPS
300.0000 mg | ORAL_CAPSULE | Freq: Three times a day (TID) | ORAL | Status: DC
  Administered 2019-06-02 – 2019-06-09 (×22): 300 mg via ORAL
  Filled 2019-06-02 (×22): qty 1

## 2019-06-02 MED ORDER — ISOSORBIDE MONONITRATE ER 30 MG PO TB24
30.0000 mg | ORAL_TABLET | Freq: Every day | ORAL | Status: DC
  Administered 2019-06-02: 30 mg via ORAL
  Filled 2019-06-02: qty 1

## 2019-06-02 MED ORDER — SODIUM CHLORIDE 0.9 % IV SOLN: 250.0000 mL | INTRAVENOUS | Status: DC | PRN

## 2019-06-02 MED ORDER — SODIUM CHLORIDE 0.9% IV SOLUTION: Freq: Once | INTRAVENOUS | Status: DC

## 2019-06-02 MED ORDER — METHYLPREDNISOLONE SODIUM SUCC 40 MG IJ SOLR
40.0000 mg | Freq: Three times a day (TID) | INTRAMUSCULAR | Status: DC
  Administered 2019-06-02 – 2019-06-09 (×22): 40 mg via INTRAVENOUS
  Filled 2019-06-02 (×22): qty 1

## 2019-06-02 MED ORDER — ONDANSETRON HCL 4 MG/2ML IJ SOLN: 4.0000 mg | Freq: Four times a day (QID) | INTRAMUSCULAR | Status: DC | PRN

## 2019-06-02 MED ORDER — INSULIN ASPART 100 UNIT/ML ~~LOC~~ SOLN: 0.0000 [IU] | Freq: Every day | SUBCUTANEOUS | Status: DC

## 2019-06-02 MED ORDER — ONDANSETRON HCL 4 MG PO TABS: 4.0000 mg | ORAL_TABLET | Freq: Four times a day (QID) | ORAL | Status: DC | PRN

## 2019-06-02 MED ORDER — FUROSEMIDE 10 MG/ML IJ SOLN
40.0000 mg | Freq: Once | INTRAMUSCULAR | Status: AC
  Administered 2019-06-02: 40 mg via INTRAVENOUS
  Filled 2019-06-02: qty 4

## 2019-06-02 MED ORDER — SALINE SPRAY 0.65 % NA SOLN
1.0000 | NASAL | Status: DC | PRN
  Administered 2019-06-08: 1 via NASAL
  Filled 2019-06-02: qty 44

## 2019-06-02 MED ORDER — ACETAMINOPHEN 325 MG PO TABS
650.0000 mg | ORAL_TABLET | Freq: Four times a day (QID) | ORAL | Status: DC | PRN
  Administered 2019-06-02: 650 mg via ORAL
  Filled 2019-06-02: qty 2

## 2019-06-02 NOTE — Progress Notes (Signed)
Patient began having chest pain, requiring 15 L HFNC. MD notifed. Obtained EKG. Will continue to monitor

## 2019-06-02 NOTE — Progress Notes (Signed)
[facility] arrived at 0430 to transport patient to [place]. Report given as well as new set of VS taken. All belongings sent with patient. Paperwork given to [employer].   Earleen Reaper, RN

## 2019-06-02 NOTE — Consult Note (Signed)
NAME:  [name], MRN:  [id number], DOB:  01-11-1941, LOS: 0 ADMISSION DATE:  06/02/2019, CONSULTATION DATE:  10/18 REFERRING MD:  Bonner Puna, CHIEF COMPLAINT: shortness of breath  Brief History   78 year old man with mmp, here with COVID 19 PNA admitted 10/15 from [place].   History of present illness   Patient is a 78yo M with history of CAD s/p CABG 1990, PVD, prediabetes, OSA not on CPAP, HTN and HLD who was admitted at [facility] 10/15 with covid-19 pneumonia causing hypoxic respiratory failure after initially testing positive in presurgical screening 10/5. Remdesivir and steroids were provided until he was ultimately transferred to [facility] today and admitted by Dr. Shanon Brow. Feels about the same as he has, but oxygen requirement seems to be increasing. He's hungry, feels short of breath with any exertion. No chest pain, no leg swelling.   Transferred to ICU for increasing oxygen requirements.   Feels better (sat high 90s) on HHF.  Past Medical History  CAD s/p CABG 1990, PVD, prediabetes, OSA not on CPAP, HTN and HLD  Spinal stenosis Back pain Shoulder pain B hip replacements  Significant Hospital Events     Consults:    Procedures:    Significant Diagnostic Tests:   cxr 10/18: . Increasing severe multilobar bilateral pneumonia, as above. Micro Data:   Bl cx 10/15 P Antimicrobials:   remdesivir 10/15 Ceftriaxone 10/16 - 10/17 Azithro 10/16-10/17 Interim history/subjective:  Feels better on HHF, still having back pain (chronic) No chest pain currently  Objective   Blood pressure (!) 155/79, pulse 88, temperature 98 F (36.7 C), temperature source Axillary, resp. rate 17, height 6' (1.829 m), weight 92 kg, SpO2 (!) 86 %.        Intake/Output Summary (Last 24 hours) at 06/02/2019 1842 Last data filed at 06/02/2019 0949 Gross per 24 hour  Intake 360 ml  Output --  Net 360 ml   Filed Weights   06/02/19 [id number]  Weight: 92 kg    Examination: General:NAD  pleasant HENT: NCAT  Lungs: Crackles at bases Cardiovascular: RRR no mgr Abdomen: NT, ND, NBS Extremities: No edema, no erythema no tenderness Neuro: A and O x 3, moving all ext equally   Resolved Hospital Problem list     Assessment & Plan:  COVID 19 PNA - Cont supplemental oxygen as needed.  Remdesivir x 5 days Systemic steroids x 5 days Received convalescent plasma today.  Full dose lovenox.  Keep fluid balance even.  I will  Stop IV fluids (maintenance).  Incentive spirometry.  Agree with stopping CAP antibiotics for now.  Patient cannot lay prone due to msk pain.   Hx CAD, some atypical chest discomfort today:  Trop non concerning.  EKG nsr.  Cont to monitor closely.   Best practice:  Diet: reg Pain/Anxiety/Delirium protocol (if indicated): na VAP protocol (if indicated): na DVT prophylaxis: lovenox BID GI prophylaxis:  Glucose control: Mobility: oob as tolerates Code Status: Full  Family Communication: TRH updated family Disposition: ICU  Labs   CBC: Recent Labs  Lab 05/28/19 0935 05/30/19 0943 05/31/19 0711 06/02/19 0634  WBC 4.2 4.8 7.9 9.6  NEUTROABS 3.1 3.9  --  8.7*  HGB 14.4 14.0 14.1 13.4  HCT 41.9 41.0 41.2 38.7*  MCV 91.9 91.7 91.2 93.5  PLT 135* 144* 153 [id number]    Basic Metabolic Panel: Recent Labs  Lab 05/28/19 0935 05/30/19 0943 05/31/19 0711 06/02/19 0634  NA 138 138 140 138  K 3.8 4.0 4.1 4.4  CL 100 102 106 105  CO2 27 23 24  20*  GLUCOSE 96 93 177* 175*  BUN 25* 25* 30* 33*  CREATININE 1.31* 1.15 1.11 1.08  CALCIUM 9.0 8.7* 8.5* 8.2*   GFR: Estimated Creatinine Clearance: 61.9 mL/min (by C-G formula based on SCr of 1.08 mg/dL). Recent Labs  Lab 05/28/19 0935 05/30/19 0943 05/30/19 1133 05/30/19 1143 05/31/19 0711 06/02/19 0634  PROCALCITON  --   --   --  0.17  --   --   WBC 4.2 4.8  --   --  7.9 9.6  LATICACIDVEN 0.8 1.3 1.1  --   --   --     Liver Function Tests: Recent Labs  Lab 05/30/19 0943 06/02/19 0634   AST 37 44*  ALT 59* 63*  ALKPHOS 37* 43  BILITOT 0.6 0.4  PROT 6.4* 6.0*  ALBUMIN 3.5 2.9*   No results for input(s): LIPASE, AMYLASE in the last 168 hours. No results for input(s): AMMONIA in the last 168 hours.  ABG    Component Value Date/Time   TCO2 26 03/18/2010 1036     Coagulation Profile: No results for input(s): INR, PROTIME in the last 168 hours.  Cardiac Enzymes: No results for input(s): CKTOTAL, CKMB, CKMBINDEX, TROPONINI in the last 168 hours.  HbA1C: Hemoglobin A1C  Date/Time Value Ref Range Status  06/07/2018 08:40 AM 5.8 (A) 4.0 - 5.6 % Final   Hgb A1c MFr Bld  Date/Time Value Ref Range Status  12/04/2018 11:33 AM 6.4 4.6 - 6.5 % Final    Comment:    Glycemic Control Guidelines for People with Diabetes:Non Diabetic:  <6%Goal of Therapy: <7%Additional Action Suggested:  >8%   11/30/2017 10:35 AM 6.4 4.6 - 6.5 % Final    Comment:    Glycemic Control Guidelines for People with Diabetes:Non Diabetic:  <6%Goal of Therapy: <7%Additional Action Suggested:  >8%     CBG: Recent Labs  Lab 06/02/19 0947 06/02/19 1152 06/02/19 1655  [place]* 245* 210*    Review of Systems:   Neg 12 pt review aside from HPI+  Past Medical History  He,  has a past medical history of BENIGN PROSTATIC HYPERTROPHY, WITH URINARY OBSTRUCTION (09/06/2007), CAD s/p CABG, Carotid arterial disease ([place]), 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), Community acquired pneumonia of right lower lobe of lung (06/05/2017), DUPUYTREN'S CONTRACTURE, 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   Surgical History    Past Surgical History:  Procedure Laterality Date   ABDOMINAL AORTOGRAM W/LOWER EXTREMITY [name] 09/12/2018   Procedure: ABDOMINAL AORTOGRAM W/LOWER EXTREMITY;  Surgeon: [name], MD;  Location: [place] [facility];  Service: Cardiovascular;  Laterality: [name];   APPENDECTOMY     rupture   CARDIAC CATHETERIZATION [name] 07/07/2015   Procedure: Left Heart Cath and Cors/Grafts Angiography;  Surgeon: [name], MD;  Location: [place] [facility];  Service: Cardiovascular;  Laterality: [name];   CATARACT EXTRACTION, BILATERAL     CERVICAL SPINE SURGERY  12/2016   cervical stenosis [name])   COLONOSCOPY  03/2010   HP polyp, diverticulosis, rpt 10 yrs ([name])   CORONARY ARTERY BYPASS GRAFT  1990   3 vessel    CYSTOSCOPY  12/23/10   [place]   EYE SURGERY     cataract   KNEE ARTHROSCOPY Right    x2   LAMINOTOMY  1986   L5/S1 lumbar laminotomy for two ruptured discs/fusion   LUMBAR LAMINECTOMY/DECOMPRESSION MICRODISCECTOMY [name] 07/13/2016   Procedure: LUMBAR TWO-THREE, LUMBAR THREE-FOUR, LUMBAR FOUR-FIVE LAMINECTOMY AND FORAMINOTOMY;  Surgeon: [name], MD;  Location: [facility];  Service: Neurosurgery;  Laterality: [name];  LAMINECTOMY AND FORAMINOTOMY L2-L3, L3-L4,L4-L5   TOTAL HIP ARTHROPLASTY Bilateral 1999,2000   TOTAL KNEE ARTHROPLASTY Right 03/06/2017   Procedure: RIGHT TOTAL KNEE ARTHROPLASTY;  Surgeon: Gaynelle Arabian, MD;  Location: [facility];  Service: Orthopedics;  Laterality: Right;     Social History   reports that he quit smoking about 30 years ago. His smoking use included cigarettes. He has a 25.00 pack-year smoking history. He has never used smokeless tobacco. He reports that he does not drink alcohol or use drugs.   Family History   His family history includes Cancer in his sister and another family member; Diabetes in his brother and mother; Heart attack in his mother; Heart disease in  his father; Kidney disease in his father and sister; Stroke in his father and mother.   Allergies Allergies  Allergen Reactions   Vioxx [Rofecoxib] Other (See Comments)    Hemorrhage    Contrast Media [Iodinated Diagnostic Agents] Itching and Rash    Delayed reaction post abdominal aortagram.    Morphine Nausea Only and Other (See Comments)    Irritability      Home Medications  Prior to Admission medications   Medication Sig Start Date End Date Taking? Authorizing Provider  acetaminophen (TYLENOL) 650 MG CR tablet Take 1,300 mg by mouth every 8 (eight) hours as needed for pain.    [provider]  albuterol (VENTOLIN HFA) 108 (90 Base) MCG/ACT inhaler Inhale 2 puffs into the lungs every 6 (six) hours as needed for wheezing or shortness of breath. 05/22/19   Ria Bush, MD  aspirin EC 81 MG tablet Take 1 tablet (81 mg total) by mouth daily. 05/01/17   [name], MD  azithromycin (ZITHROMAX) 250 MG tablet Take 2 tablets PO on day 1, then take 1 tablet PO daily for 4 more days 05/24/19   Hinda Kehr, MD  Cyanocobalamin (B-12) 1000 MCG SUBL Place 1 tablet under the tongue daily. Patient taking differently: Place 1 tablet under the tongue daily with lunch.  12/07/18   Ria Bush, MD  ezetimibe (ZETIA) 10 MG tablet TAKE ONE TABLET BY MOUTH EVERY DAY Patient taking differently: Take 10 mg by mouth at bedtime.  04/02/19   [name], MD  gabapentin (NEURONTIN) 300 MG capsule Take 300 mg by mouth 3 (three) times daily.     [provider]  HYDROcodone-homatropine (HYCODAN) 5-1.5 MG/5ML syrup Take 5 mLs by mouth every 6 (six) hours as needed for cough. 05/24/19   Hinda Kehr, MD  isosorbide mononitrate (IMDUR) 30 MG 24 hr tablet Take 1 tablet (30 mg total) by mouth 2 (two) times a day. 02/20/19 05/30/19  [name], MD  ketoconazole (NIZORAL) 2 % cream Apply 1 application topically daily as needed for irritation.     [provider]   nitroGLYCERIN (NITROSTAT) 0.4 MG SL tablet Place 1 tablet (0.4 mg total) under the tongue every 5 (five) minutes as needed for chest pain. 12/07/18   Ria Bush, MD  polyethylene glycol (MIRALAX / GLYCOLAX) 17 g packet Take 17 g by mouth every other day.  [provider]  predniSONE (DELTASONE) 10 MG tablet Take 4 tabs (40 mg) PO x 3 days, then take 2 tabs (20 mg) PO x 3 days, then take 1 tab (10 mg) PO x 3 days, then take 1/2 tab (5 mg) PO x 4 days. 05/24/19   Hinda Kehr, MD  ranolazine (RANEXA) 500 MG 12 hr tablet Take 1 tablet (500 mg total) by mouth 2 (two) times daily for 14 days. Patient not taking: Reported on 05/30/2019 04/17/19 05/13/20  [name], MD  simvastatin (ZOCOR) 40 MG tablet TAKE ONE TABLET BY MOUTH EVERY DAY Patient taking differently: Take 40 mg by mouth at bedtime.  04/02/19   [name], MD  tolterodine (DETROL LA) 4 MG 24 hr capsule Take 4 mg by mouth daily.    [provider]     Critical care time: 45 minutes

## 2019-06-02 NOTE — H&P (Signed)
History and Physical    [name] [id number] DOB: 28-Apr-1941 DOA: 06/02/2019  PCP: Ria Bush, MD  Patient coming from: home  Chief Complaint:  sob  HPI: [name] is a 78 y.o. male with medical history significant of CAD, PVD tested for covid 19 on 10/15 positive for prescreen for lower back surgery comes in with worsening sob and new oxgyen requirement.  Was on NRB at [place] given solumedrol and remdisivir and his breathing is better but still on NRB although about to wean down to [place].  mentating normally.  Denies pain.     Review of Systems: As per HPI otherwise 10 point review of systems negative.   Past Medical History:  Diagnosis Date  . BENIGN PROSTATIC HYPERTROPHY, WITH URINARY OBSTRUCTION 09/06/2007  . CAD s/p CABG    a. 1990 s/p MI-->CABG x 2; b. 2002 s/p BMS to LCX; c. 06/2015 Cath: LM 50ost, LAD 100ost/p, 73m/d, D2 nl, LCX  patent stent, RCA 80p (small), LIMA->LAD nl, VG->D2 nl-->Med Rx.  . Carotid arterial disease ([place])    a. 08/2016 Carotid U/S: <39% bilat.  . 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 Community acquired pneumonia of right lower lobe of lung 06/05/2017  . 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 DVT, HX OF 1998  . GERD 04/30/2007  . Headache    hx migraines  . History of hiatal hernia   . History of shingles   . HYPERLIPIDEMIA 04/26/2007  . HYPERTENSION 04/30/2007  . INGUINAL HERNIA, RIGHT 05/26/2010  . Laceration of skin of left hand 03/08/2018  . Lumbar disc disease with radiculopathy   . Myocardial infarction ([place])    1989, 2002  . OSA (obstructive sleep apnea)    a. did not tolerate CPAP.  Marland Kitchen Osteoarthritis   . PAD (peripheral artery disease) ([place])    a. 07/2017 LE duplex: RSFA 75-70m, LSFA 75-89m, 50-74d.  . Pneumonia 07/2014   [facility] hospitalization  . Pre-diabetes   . PSA, INCREASED 07/09/2008  . Spinal stenosis of lumbar region     Past Surgical History:  Procedure Laterality Date  . ABDOMINAL AORTOGRAM W/LOWER  EXTREMITY [name] 09/12/2018   Procedure: ABDOMINAL AORTOGRAM W/LOWER EXTREMITY;  Surgeon: [name], MD;  Location: [facility];  Service: Cardiovascular;  Laterality: [name];  . APPENDECTOMY     rupture  . CARDIAC CATHETERIZATION [name] 07/07/2015   Procedure: Left Heart Cath and Cors/Grafts Angiography;  Surgeon: [name], MD;  Location: [facility];  Service: Cardiovascular;  Laterality: [name];  . CATARACT EXTRACTION, BILATERAL    . CERVICAL SPINE SURGERY  12/2016   cervical stenosis [name])  . COLONOSCOPY  03/2010   HP polyp, diverticulosis, rpt 10 yrs ([name])  . CORONARY ARTERY BYPASS GRAFT  1990   3 vessel   . CYSTOSCOPY  12/23/10   [facility]  . EYE SURGERY     cataract  . KNEE ARTHROSCOPY Right    x2  . LAMINOTOMY  1986   L5/S1 lumbar laminotomy for two ruptured discs/fusion  . LUMBAR LAMINECTOMY/DECOMPRESSION MICRODISCECTOMY [name] 07/13/2016   Procedure: LUMBAR TWO-THREE, LUMBAR THREE-FOUR, LUMBAR FOUR-FIVE LAMINECTOMY AND FORAMINOTOMY;  Surgeon: [name], MD;  Location: [facility];  Service: Neurosurgery;  Laterality: [name];  LAMINECTOMY AND FORAMINOTOMY L2-L3, L3-L4,L4-L5  . TOTAL HIP ARTHROPLASTY Bilateral 1999,2000  . TOTAL KNEE ARTHROPLASTY Right 03/06/2017   Procedure: RIGHT TOTAL KNEE ARTHROPLASTY;  Surgeon: Gaynelle Arabian, MD;  Location: [facility];  Service: Orthopedics;  Laterality: Right;     reports  that he quit smoking about 30 years ago. His smoking use included cigarettes. He has a 25.00 pack-year smoking history. He has never used smokeless tobacco. He reports that he does not drink alcohol or use drugs.  Allergies  Allergen Reactions  . Vioxx [Rofecoxib] Other (See Comments)    Hemorrhage   . Contrast Media [Iodinated Diagnostic Agents] Itching and Rash    Delayed reaction post abdominal aortagram.   . Morphine Nausea Only and Other (See Comments)    Irritability     Family History  Problem Relation Age of Onset  . Stroke Mother   . Heart  attack Mother   . Diabetes Mother   . Stroke Father   . Heart disease Father   . Kidney disease Father        PCKD  . Cancer Sister        throat  . Diabetes Brother   . Kidney disease Sister        PCKD  . Cancer Other        5/7 nephews with lung cancer    Prior to Admission medications   Medication Sig Start Date End Date Taking? Authorizing Provider  acetaminophen (TYLENOL) 650 MG CR tablet Take 1,300 mg by mouth every 8 (eight) hours as needed for pain.    [provider]  albuterol (VENTOLIN HFA) 108 (90 Base) MCG/ACT inhaler Inhale 2 puffs into the lungs every 6 (six) hours as needed for wheezing or shortness of breath. 05/22/19   Ria Bush, MD  aspirin EC 81 MG tablet Take 1 tablet (81 mg total) by mouth daily. 05/01/17   [name], MD  azithromycin (ZITHROMAX) 250 MG tablet Take 2 tablets PO on day 1, then take 1 tablet PO daily for 4 more days 05/24/19   Hinda Kehr, MD  Cyanocobalamin (B-12) 1000 MCG SUBL Place 1 tablet under the tongue daily. Patient taking differently: Place 1 tablet under the tongue daily with lunch.  12/07/18   Ria Bush, MD  ezetimibe (ZETIA) 10 MG tablet TAKE ONE TABLET BY MOUTH EVERY DAY Patient taking differently: Take 10 mg by mouth at bedtime.  04/02/19   [name], MD  gabapentin (NEURONTIN) 300 MG capsule Take 300 mg by mouth 3 (three) times daily.     [provider]  HYDROcodone-homatropine (HYCODAN) 5-1.5 MG/5ML syrup Take 5 mLs by mouth every 6 (six) hours as needed for cough. 05/24/19   Hinda Kehr, MD  isosorbide mononitrate (IMDUR) 30 MG 24 hr tablet Take 1 tablet (30 mg total) by mouth 2 (two) times a day. 02/20/19 05/30/19  [name], MD  ketoconazole (NIZORAL) 2 % cream Apply 1 application topically daily as needed for irritation.     [provider]  nitroGLYCERIN (NITROSTAT) 0.4 MG SL tablet Place 1 tablet (0.4 mg total) under the tongue every 5 (five) minutes as needed for  chest pain. 12/07/18   Ria Bush, MD  polyethylene glycol (MIRALAX / GLYCOLAX) 17 g packet Take 17 g by mouth every other day.     [provider]  predniSONE (DELTASONE) 10 MG tablet Take 4 tabs (40 mg) PO x 3 days, then take 2 tabs (20 mg) PO x 3 days, then take 1 tab (10 mg) PO x 3 days, then take 1/2 tab (5 mg) PO x 4 days. 05/24/19   Hinda Kehr, MD  ranolazine (RANEXA) 500 MG 12 hr tablet Take 1 tablet (500 mg total) by mouth 2 (two) times  daily for 14 days. Patient not taking: Reported on 05/30/2019 04/17/19 05/13/20  [name], MD  simvastatin (ZOCOR) 40 MG tablet TAKE ONE TABLET BY MOUTH EVERY DAY Patient taking differently: Take 40 mg by mouth at bedtime.  04/02/19   [name], MD  tolterodine (DETROL LA) 4 MG 24 hr capsule Take 4 mg by mouth daily.    [provider]    Physical Exam: Vitals:   06/02/19 0530  BP: (!) 145/70  Pulse: 67  Temp: 98.4 F (36.9 C)  TempSrc: Oral      Constitutional: NAD, calm, comfortable Vitals:   06/02/19 0530  BP: (!) 145/70  Pulse: 67  Temp: 98.4 F (36.9 C)  TempSrc: Oral   Eyes: PERRL, lids and conjunctivae normal ENMT: Mucous membranes are moist. Posterior pharynx clear of any exudate or lesions.Normal dentition.  Neck: normal, supple, no masses, no thyromegaly Respiratory: clear to auscultation bilaterally, no wheezing, no crackles. Normal respiratory effort. No accessory muscle use.  Cardiovascular: Regular rate and rhythm, no murmurs / rubs / gallops. No extremity edema. 2+ pedal pulses. No carotid bruits.  Abdomen: no tenderness, no masses palpated. No hepatosplenomegaly. Bowel sounds positive.  Musculoskeletal: no clubbing / cyanosis. No joint deformity upper and lower extremities. Good ROM, no contractures. Normal muscle tone.  Skin: no rashes, lesions, ulcers. No induration Neurologic: CN 2-12 grossly intact. Sensation intact, DTR normal. Strength 5/5 in all 4.  Psychiatric: Normal  judgment and insight. Alert and oriented x 3. Normal mood.    Labs on Admission: I have personally reviewed following labs and imaging studies  CBC: Recent Labs  Lab 05/28/19 0935 05/30/19 0943 05/31/19 0711  WBC 4.2 4.8 7.9  NEUTROABS 3.1 3.9  --   HGB 14.4 14.0 14.1  HCT 41.9 41.0 41.2  MCV 91.9 91.7 91.2  PLT 135* 144* 0000000   Basic Metabolic Panel: Recent Labs  Lab 05/28/19 0935 05/30/19 0943 05/31/19 0711  NA 138 138 140  K 3.8 4.0 4.1  CL 100 102 106  CO2 27 23 24   GLUCOSE 96 93 177*  BUN 25* 25* 30*  CREATININE 1.31* 1.15 1.11  CALCIUM 9.0 8.7* 8.5*   GFR: Estimated Creatinine Clearance: 60.2 mL/min (by C-G formula based on SCr of 1.11 mg/dL). Liver Function Tests: Recent Labs  Lab 05/30/19 0943  AST 37  ALT 59*  ALKPHOS 37*  BILITOT 0.6  PROT 6.4*  ALBUMIN 3.5   No results for input(s): LIPASE, AMYLASE in the last 168 hours. No results for input(s): AMMONIA in the last 168 hours. Coagulation Profile: No results for input(s): INR, PROTIME in the last 168 hours. Cardiac Enzymes: No results for input(s): CKTOTAL, CKMB, CKMBINDEX, TROPONINI in the last 168 hours. BNP (last 3 results) No results for input(s): PROBNP in the last 8760 hours. HbA1C: No results for input(s): HGBA1C in the last 72 hours. CBG: No results for input(s): GLUCAP in the last 168 hours. Lipid Profile: No results for input(s): CHOL, HDL, LDLCALC, TRIG, CHOLHDL, LDLDIRECT in the last 72 hours. Thyroid Function Tests: No results for input(s): TSH, T4TOTAL, FREET4, T3FREE, THYROIDAB in the last 72 hours. Anemia Panel: Recent Labs    05/30/19 1143  FERRITIN 771*   Urine analysis:    Component Value Date/Time   COLORURINE yellow 07/02/2008 0832   APPEARANCEUR Clear 07/02/2008 0832   LABSPEC 1.015 07/02/2008 0832   PHURINE 5.5 07/02/2008 0832   HGBUR negative 07/02/2008 0832   BILIRUBINUR negative 07/02/2008 0832   UROBILINOGEN  0.2 07/02/2008 0832   NITRITE negative  07/02/2008 0832   Sepsis Labs: !!!!!!!!!!!!!!!!!!!!!!!!!!!!!!!!!!!!!!!!!!!! @LABRCNTIP (procalcitonin:4,lacticidven:4) ) Recent Results (from the past 240 hour(s))  Culture, blood (routine x 2)     Status: None (Preliminary result)   Collection Time: 05/30/19  9:44 AM   Specimen: BLOOD  Result Value Ref Range Status   Specimen Description BLOOD LEFT ARM  Final   Special Requests   Final    BOTTLES DRAWN AEROBIC AND ANAEROBIC Blood Culture adequate volume   Culture   Final    NO GROWTH 2 DAYS Performed at [facility], [address] [place], [place] [address]    Report Status PENDING  Incomplete  Culture, blood (routine x 2)     Status: None (Preliminary result)   Collection Time: 05/30/19  9:44 AM   Specimen: BLOOD  Result Value Ref Range Status   Specimen Description BLOOD RIGHT HAND  Final   Special Requests   Final    BOTTLES DRAWN AEROBIC AND ANAEROBIC Blood Culture adequate volume   Culture   Final    NO GROWTH 2 DAYS Performed at [facility], [address] [place], [place] [address]    Report Status PENDING  Incomplete  SARS CORONAVIRUS 2 (TAT 6-24 HRS) Nasopharyngeal Nasopharyngeal Swab     Status: Abnormal   Collection Time: 05/31/19  7:44 AM   Specimen: Nasopharyngeal Swab  Result Value Ref Range Status   SARS Coronavirus 2 POSITIVE (A) NEGATIVE Final    Comment: CRITICAL RESULT CALLED TO, READ BACK BY AND VERIFIED WITH: S.SUMMERS RN 1640 05/31/2019 MCCORMICK K (NOTE) SARS-CoV-2 target nucleic acids are DETECTED. The SARS-CoV-2 RNA is generally detectable in upper and lower respiratory specimens during the acute phase of infection. Positive results are indicative of active infection with SARS-CoV-2. Clinical  correlation with patient history and other diagnostic information is necessary to determine patient infection status. Positive results do  not rule out bacterial infection or co-infection with other viruses. The expected result is  Negative. Fact Sheet for Patients: SugarRoll.be Fact Sheet for Healthcare Providers: https://www.woods-mathews.com/ This test is not yet approved or cleared by the [place] FDA and  has been authorized for detection and/or diagnosis of SARS-CoV-2 by FDA under an Emergency Use Authorization (EUA). This EUA will remain  in effect (meaning this test can  be used) for the duration of the COVID-19 declaration under Section 564(b)(1) of the Act, 21 U.S.C. section 360bbb-3(b)(1), unless the authorization is terminated or revoked sooner. Performed at [facility], [place] [address], [place], [place] [address]      Radiological Exams on Admission: No results found.  Old chart reviewed cxr with left infiltrate early   Assessment/Plan 78 yo male with acute hypoxic respiratory failure secondary to covid 19 pna  Principal Problem:   Pneumonia due to COVID-19 virus- iv solumedrol.  Remdisivir.  Pt agreeable to aforementioned meds for covid infection.  Vit c/zinc and lovenox ordered.  Wean oxygen as tolerates to keep sats above 88%  Active Problems:   Acute respiratory failure due to COVID-19 [facility])- as above    Essential hypertension- clarify home meds and address    PVD (peripheral vascular disease) with claudication ([place])- noted    DVT, HX OF- on lovenox    CAD (coronary artery disease), native coronary artery- noted   Med rec pending pharm review   DVT prophylaxis:  lovenox Code Status: full Family Communication:  none Disposition Plan:  days Consults called:  none Admission status:  admission   Dolora Ridgely A MD [facility]  If 7PM-7AM, please contact night-coverage [email or web address] Password [email or web address]  06/02/2019, 6:00 AM

## 2019-06-02 NOTE — Progress Notes (Signed)
Patient requiring increase 6-7 L [place], sats mid 80s. MD notified.

## 2019-06-02 NOTE — Progress Notes (Addendum)
Called by RN regarding patient complaining of chest pain and heightening oxygen requirement, labored breathing.   On arrival, he reports "it's letting up a bit," but classifies the pain as burning, worse with deep breaths, constant, waxing/waning, located in the central lower chest radiating somewhat to the midback. He doesn't believe this is similar to pain he had with a heart attack remotely, and it is the same as the intermittent pain he was complaining of at [facility] for which he was given maalox without improvement. It usually goes away on its own with rest, laying down and taking slow breaths. Albuterol seems to have helped some. He does not want morphine for the pain. He states he still feels no better than at admission regarding shortness of breath and has in fact progressed from [place] to 15L NRB throughout today.   On exam he is uncomfortable but in no acute distress, tachypneic without accessory muscle use, RRR without M/R/G, JVD, or significant edema. Crackles bilaterally noted without wheezes. With longer sentences SpO2 hovers 87-88% on 15L NRB with good pleth. Abdomen soft, nontender with +BS. ECG personally reviewed from this afternoon shows NSR with stable LAFB and no new ST changes.  With worsening hypoxia, will transfer to ICU for closer monitoring, possibly HHF. Troponin ordered and pending. Will give additional 30mg  dose of imdur. Give albuterol MDI now as that showed subjective benefit, though there is no wheezing on exam and he's moving air throughout. Check CXR. Consulted Dr. Patsey Berthold with PCCM. I appreciate their evaluation. I had spoken with the patient's wife and daughter this morning, conveying my concern for decompensation, confirmed full code. I called again this afternoon and had no answer.   Total critical care time: 35 minutes  Due to a high probability of clinically significant, life threatening deterioration, the patient required my highest level of preparedness to intervene  emergently and I personally spent this critical care time directly and personally managing the patient. This critical care time included obtaining a history; examining the patient; pulse oximetry; ordering and review of studies; arranging urgent treatment with development of a management plan; evaluation of patient's response to treatment; frequent reassessment; and, discussions with other providers. This critical care time was performed to assess and manage the high probability of imminent, life-threatening deterioration that could result in multi-organ failure. It was exclusive of separately billable procedures and treating other patients and teaching time.   Vance Gather, MD 06/02/2019 5:07 PM

## 2019-06-02 NOTE — Progress Notes (Addendum)
PROGRESS NOTE  Subjective: Patient is a 78yo M with history of CAD s/p CABG 1990, PVD, prediabetes, OSA not on CPAP, HTN and HLD who was admitted at [facility] 10/15 with covid-19 pneumonia causing hypoxic respiratory failure after initially testing positive in presurgical screening 10/5. Remdesivir and steroids were providded until he was ultimately transferred to [facility] this AM and admitted by Dr. Shanon Brow. Feels about the same as he has, but oxygen requirement seems to be increasing. He's hungry, feels short of breath with any exertion. No chest pain, no leg swelling.   Objective: BP (!) 151/66 (BP Location: Left Arm)   Pulse 72   Temp 97.8 F (36.6 C) (Oral)   Resp 17   Ht 6' (1.829 m)   Wt 92 kg   SpO2 (!) 88%   BMI 27.51 kg/m   Gen: Pleasant, elderly gentleman in no distress Pulm: Tachypneic with crackles L > R.   CV: RRR, no murmur, no JVD, no significant edema GI: Soft, NT, ND, +BS  Neuro: Alert and oriented. No focal deficits. Skin: No rashes, lesions or ulcers  Assessment & Plan: Acute hypoxic respiratory failure due to covid-19 pneumonia: CXRs personally reviewed showing progressive left lower lobe predominant opacities on serial exams, hypoxia seems to be worsening as well. At high risk of decompensation due to age and comorbidities. Troponin and d-dimer modestly elevated at admission. High sensitivity troponin normalized and d-dimer trend pending.  - Note hx delayed reaction to iodinated contrast, would empirically give therapeutic dose anticoagulation if d-dimer trending upward. Continue ppx dose for now. - Confirmed full code status - Switch [place] > humidified HFNC to maintain SpO2 90% or greater, transfer to ICU if HHF required. - Continue remdesivir 10/15 - 10/19 - Augment steroids to between 1-1.5mg /kg/day solumedrol, changing to more frequent dosing.  - With progressive hypoxia despite several days of Tx, will give convalescent plasma. Coadminister lasix 40mg  IV to avoid overload.  Rationale, risks, benefits and investigational nature, use through EUA all discussed and patient agrees to proceed. - No indication for antibiotics with reassuring PCT, no leukocytosis, and known covid without lobar consolidation.  - Repeat labs (not checked since admission at [facility]) pending.   Prediabetes: With hyperglycemia. - Start SSI especially since on steroids  CAD s/p CABG: Troponin initially modestly elevated, normalized. No current chest pain. - Continue home ASA, statin, imdur.   PVD, carotid stenosis:  - Continue ASA, statin, zetia  HLD:  - Continue  Continue other medications per orders.  Wil d/w daughter, who is a Therapist, sports.  Patrecia Pour, MD Pager [phone] 06/02/2019, 8:21 AM

## 2019-06-03 DIAGNOSIS — I6523 Occlusion and stenosis of bilateral carotid arteries: Secondary | ICD-10-CM

## 2019-06-03 DIAGNOSIS — I739 Peripheral vascular disease, unspecified: Secondary | ICD-10-CM

## 2019-06-03 DIAGNOSIS — I2510 Atherosclerotic heart disease of native coronary artery without angina pectoris: Secondary | ICD-10-CM

## 2019-06-03 DIAGNOSIS — J069 Acute upper respiratory infection, unspecified: Secondary | ICD-10-CM

## 2019-06-03 LAB — PREPARE FRESH FROZEN PLASMA

## 2019-06-03 LAB — CBC WITH DIFFERENTIAL/PLATELET
Abs Immature Granulocytes: 0.13 10*3/uL — ABNORMAL HIGH (ref 0.00–0.07)
Basophils Absolute: 0 10*3/uL (ref 0.0–0.1)
Basophils Relative: 0 %
Eosinophils Absolute: 0 10*3/uL (ref 0.0–0.5)
Eosinophils Relative: 0 %
HCT: 37.8 % — ABNORMAL LOW (ref 39.0–52.0)
Hemoglobin: 12.8 g/dL — ABNORMAL LOW (ref 13.0–17.0)
Immature Granulocytes: 1 %
Lymphocytes Relative: 5 %
Lymphs Abs: 0.4 10*3/uL — ABNORMAL LOW (ref 0.7–4.0)
MCH: 31.7 pg (ref 26.0–34.0)
MCHC: 33.9 g/dL (ref 30.0–36.0)
MCV: 93.6 fL (ref 80.0–100.0)
Monocytes Absolute: 0.4 10*3/uL (ref 0.1–1.0)
Monocytes Relative: 4 %
Neutro Abs: 8.4 10*3/uL — ABNORMAL HIGH (ref 1.7–7.7)
Neutrophils Relative %: 90 %
Platelets: 201 10*3/uL (ref 150–400)
RBC: 4.04 MIL/uL — ABNORMAL LOW (ref 4.22–5.81)
RDW: 12.3 % (ref 11.5–15.5)
WBC: 9.4 10*3/uL (ref 4.0–10.5)
nRBC: 0 % (ref 0.0–0.2)

## 2019-06-03 LAB — BPAM FFP
Blood Product Expiration Date: 202010191344
ISSUE DATE / TIME: 202010181357
Unit Type and Rh: 6200

## 2019-06-03 LAB — D-DIMER, QUANTITATIVE: D-Dimer, Quant: 0.75 ug/mL-FEU — ABNORMAL HIGH (ref 0.00–0.50)

## 2019-06-03 LAB — MAGNESIUM: Magnesium: 2.2 mg/dL (ref 1.7–2.4)

## 2019-06-03 LAB — COMPREHENSIVE METABOLIC PANEL
ALT: 100 U/L — ABNORMAL HIGH (ref 0–44)
AST: 48 U/L — ABNORMAL HIGH (ref 15–41)
Albumin: 3 g/dL — ABNORMAL LOW (ref 3.5–5.0)
Alkaline Phosphatase: 40 U/L (ref 38–126)
Anion gap: 11 (ref 5–15)
BUN: 47 mg/dL — ABNORMAL HIGH (ref 8–23)
CO2: 25 mmol/L (ref 22–32)
Calcium: 8.4 mg/dL — ABNORMAL LOW (ref 8.9–10.3)
Chloride: 102 mmol/L (ref 98–111)
Creatinine, Ser: 1.2 mg/dL (ref 0.61–1.24)
GFR calc Af Amer: >60 mL/min (ref >60)
GFR calc non Af Amer: 58 mL/min — ABNORMAL LOW (ref >60)
Glucose, Bld: 267 mg/dL — ABNORMAL HIGH (ref 70–99)
Potassium: 4.1 mmol/L (ref 3.5–5.1)
Sodium: 138 mmol/L (ref 135–145)
Total Bilirubin: 1.1 mg/dL (ref 0.3–1.2)
Total Protein: 5.9 g/dL — ABNORMAL LOW (ref 6.5–8.1)

## 2019-06-03 LAB — GLUCOSE, CAPILLARY
Glucose-Capillary: 199 mg/dL — ABNORMAL HIGH (ref 70–99)
Glucose-Capillary: 209 mg/dL — ABNORMAL HIGH (ref 70–99)
Glucose-Capillary: 370 mg/dL — ABNORMAL HIGH (ref 70–99)
Glucose-Capillary: 205 mg/dL — ABNORMAL HIGH (ref 70–99)

## 2019-06-03 LAB — PHOSPHORUS: Phosphorus: 4.6 mg/dL (ref 2.5–4.6)

## 2019-06-03 LAB — C-REACTIVE PROTEIN: CRP: 2.5 mg/dL — ABNORMAL HIGH (ref <1.0)

## 2019-06-03 LAB — BRAIN NATRIURETIC PEPTIDE: B Natriuretic Peptide: 155.1 pg/mL — ABNORMAL HIGH (ref 0.0–100.0)

## 2019-06-03 MED ORDER — INSULIN ASPART 100 UNIT/ML ~~LOC~~ SOLN
0.0000 [IU] | Freq: Three times a day (TID) | SUBCUTANEOUS | Status: DC
  Administered 2019-06-03: 15 [IU] via SUBCUTANEOUS

## 2019-06-03 MED ORDER — METOPROLOL TARTRATE 25 MG PO TABS
12.5000 mg | ORAL_TABLET | Freq: Two times a day (BID) | ORAL | Status: DC
  Administered 2019-06-03 – 2019-06-09 (×12): 12.5 mg via ORAL
  Filled 2019-06-03 (×13): qty 1

## 2019-06-03 MED ORDER — INSULIN GLARGINE 100 UNIT/ML ~~LOC~~ SOLN
8.0000 [IU] | Freq: Every day | SUBCUTANEOUS | Status: DC
  Administered 2019-06-03 – 2019-06-09 (×7): 8 [IU] via SUBCUTANEOUS
  Filled 2019-06-03 (×7): qty 0.08

## 2019-06-03 MED ORDER — LORAZEPAM 2 MG/ML IJ SOLN
0.5000 mg | Freq: Once | INTRAMUSCULAR | Status: AC
  Administered 2019-06-04: 0.5 mg via INTRAVENOUS
  Filled 2019-06-03: qty 1

## 2019-06-03 MED ORDER — ASPIRIN EC 81 MG PO TBEC
81.0000 mg | DELAYED_RELEASE_TABLET | Freq: Every day | ORAL | Status: DC
  Administered 2019-06-03 – 2019-06-09 (×7): 81 mg via ORAL
  Filled 2019-06-03 (×7): qty 1

## 2019-06-03 MED ORDER — INSULIN ASPART 100 UNIT/ML ~~LOC~~ SOLN
0.0000 [IU] | SUBCUTANEOUS | Status: DC
  Administered 2019-06-03: 7 [IU] via SUBCUTANEOUS
  Administered 2019-06-03 – 2019-06-04 (×2): 11 [IU] via SUBCUTANEOUS
  Administered 2019-06-04: 4 [IU] via SUBCUTANEOUS
  Administered 2019-06-04 – 2019-06-05 (×3): 3 [IU] via SUBCUTANEOUS
  Administered 2019-06-05: 11 [IU] via SUBCUTANEOUS
  Administered 2019-06-05: 4 [IU] via SUBCUTANEOUS
  Administered 2019-06-05: 3 [IU] via SUBCUTANEOUS
  Administered 2019-06-05: 4 [IU] via SUBCUTANEOUS

## 2019-06-03 MED ORDER — INSULIN ASPART 100 UNIT/ML ~~LOC~~ SOLN: 0.0000 [IU] | Freq: Every day | SUBCUTANEOUS | Status: DC

## 2019-06-03 MED ORDER — POLYETHYLENE GLYCOL 3350 17 G PO PACK
17.0000 g | PACK | Freq: Every day | ORAL | Status: DC | PRN
  Administered 2019-06-03 – 2019-06-07 (×2): 17 g via ORAL
  Filled 2019-06-03 (×2): qty 1

## 2019-06-03 NOTE — Progress Notes (Signed)
Patient states he can't lay on his side due to his back pain, patient also states he can not lay prone due to his back. Patient is shifting himself in the bed.

## 2019-06-03 NOTE — Progress Notes (Addendum)
Inpatient Diabetes Program Recommendations  AACE/ADA: New Consensus Statement on Inpatient Glycemic Control (2015)  Target Ranges:  Prepandial:   less than 140 mg/dL      Peak postprandial:   less than 180 mg/dL (1-2 hours)      Critically ill patients:  140 - 180 mg/dL   Lab Results  Component Value Date   GLUCAP 370 (H) 06/03/2019   HGBA1C 6.4 12/04/2018    Review of Glycemic Control Results for [name], [name] (MRN [id number]) as of 06/03/2019 13:17  Ref. Range 06/02/2019 16:55 06/02/2019 21:32 06/03/2019 08:05 06/03/2019 12:00  Glucose-Capillary Latest Ref Range: 70 - 99 mg/dL 210 (H) 199 (H) 209 (H) 370 (H)   Diabetes history: DM 2 Outpatient Diabetes medications:  Prednisone taper Current orders for Inpatient glycemic control:  Solumedrol 40 mg IV q 8 hours Novolog moderate tid with meals and HS  Inpatient Diabetes Program Recommendations:   Note CBG's increased.  May consider adding Levemir 10 units bid while on steroids.   Thanks,  Adah Perl, RN, BC-ADM Inpatient Diabetes Coordinator Pager [phone] (8a-5p)

## 2019-06-03 NOTE — Progress Notes (Signed)
2130- Spoke to patients step daughter, and updated on patient condition. Patient spoke to his wife,

## 2019-06-03 NOTE — Progress Notes (Signed)
Pt spouse updated via phone at [phone]. All questions answered at present time. Will continue to monitor.

## 2019-06-03 NOTE — Progress Notes (Signed)
78 yo male found to be COVID 19 positive 05/20/19 after screening prior to back surgery.  Presented to [facility] with fever, hypoxia on 05/30/19.  Started on remdesivir, decadron.  Transferred to [facility] 06/02/19 and also received convalescent plasma.  O2 needs improved and resting comfortably.    D/w Dr. Sherral Hammers.  Will follow peripherally and PCCM will intervene if clinical status gets worse.  Chesley Mires, MD [facility] Pulmonary/Critical Care 06/03/2019, 9:45 AM

## 2019-06-03 NOTE — Progress Notes (Addendum)
PROGRESS NOTE    [name]  [id number] DOB: 30-Aug-1940 DOA: 06/02/2019 PCP: Ria Bush, MD   Brief Narrative:  78yo WM PMHx  CAD s/p CABG 1990, PVD, prediabetes, OSA not on CPAP, HTN and HLD   who was admitted at [facility] 10/15 with covid-19 pneumonia causing hypoxic respiratory failure after initially testing positive in presurgical screening 10/5. Remdesivir and steroids were providded until he was ultimately transferred to [facility] this AM and admitted by Dr. Shanon Brow. Feels about the same as he has, but oxygen requirement seems to be increasing. He's hungry, feels short of breath with any exertion. No chest pain, no leg swelling.    Subjective: A/O x4, negative CP, negative abdominal pain.  Positive S OB.  States obtained Covid from his friend.  Wife has been tested and found to be negative   Assessment & Plan:   Principal Problem:   Pneumonia due to COVID-19 virus Active Problems:   Essential hypertension   PVD (peripheral vascular disease) with claudication (HCC)   DVT, HX OF   Cervical stenosis of spinal canal   CAD (coronary artery disease), native coronary artery   Acute respiratory failure due to COVID-19 ([place])   Covid pneumonia/acute respiratory failure with hypoxia -10/18 transfuse 1 unit Covid convalescent plasma -Solu-Medrol 40 mgTID - Remdesivirper pharmacy protocol -Patient initially transferred to ICU, to be placed on HHFNC, now off and on regular HFNC 8 L/min. COVID-19 Labs  Recent Labs    06/02/19 0634 06/03/19 0545  DDIMER 0.73* 0.75*  CRP 4.7* 2.5*    Lab Results  Component Value Date   SARSCOV2NAA POSITIVE (A) 05/31/2019   SARSCOV2NAA POSITIVE (A) 05/20/2019  -Continuous O2 monitoring -Titrate O2 to maintain SPO2> 88%  Prediabetes with hyperglycemia -4/21 hemoglobin A1c= 6.4 -10/19 start Lantus 8 units daily -10/19 increase resistant SSI  HLD -Lipid panel pending -Zetia 10 mg daily  Essential HTN -ASA 81 mg daily -Imdur 30  mg daily -10/19 start Metoprolol 12.5 mg BID  CAD s/p CABG -See HTN,   PVD/carotid stenosis -See HTN, prediabetes, HLD     DVT prophylaxis: Lovenox Code Status: Full Family Communication: 10/19 spoke with [name] (wife), explained plan of care answered all questions Disposition Plan: TBD   Consultants:    Procedures/Significant Events:     I have personally reviewed and interpreted all radiology studies and my findings are as above.  VENTILATOR SETTINGS: HFNC Flow rate; 8 L/min SPO2 96%   Cultures   Antimicrobials: Anti-infectives (From admission, onward)   Start     Stop   06/02/19 1600  remdesivir 100 mg in sodium chloride 0.9 % 250 mL IVPB     06/04/19 1559       Devices    LINES / TUBES:      Continuous Infusions:  sodium chloride     sodium chloride 10 mL/hr at 06/03/19 0700   remdesivir 100 mg in NS 250 mL 100 mg (06/02/19 1856)     Objective: Vitals:   06/03/19 0400 06/03/19 0500 06/03/19 0600 06/03/19 0700  BP: (!) 141/55 136/72 138/69 (!) 154/72  Pulse: 76 75 75 77  Resp: (!) 25 (!) 25 (!) 30 (!) 24  Temp: 97.6 F (36.4 C)     TempSrc: Oral     SpO2: 92% 93% 95% (!) 89%  Weight:      Height:        Intake/Output Summary (Last 24 hours) at 06/03/2019 0738 Last data filed at 06/03/2019 0700 Gross per 24 hour  Intake 980.32 ml  Output 2575 ml  Net -1594.68 ml   Filed Weights   06/02/19 0614  Weight: 92 kg    Examination:  General: A/O x4, positive acute respiratory distress Eyes: negative scleral hemorrhage, negative anisocoria, negative icterus ENT: Negative Runny nose, negative gingival bleeding, Neck:  Negative scars, masses, torticollis, lymphadenopathy, JVD Lungs: Tachypneic diffuse decreased breath sounds bilaterally without wheezes or crackles Cardiovascular: Regular rate and rhythm without murmur gallop or rub normal S1 and S2 Abdomen: negative abdominal pain, nondistended, positive soft, bowel sounds, no  rebound, no ascites, no appreciable mass Extremities: No significant cyanosis, clubbing, or edema bilateral lower extremities Skin: Negative rashes, lesions, ulcers Psychiatric:  Negative depression, negative anxiety, negative fatigue, negative mania  Central nervous system:  Cranial nerves II through XII intact, tongue/uvula midline, all extremities muscle strength 5/5, sensation intact throughout, negative dysarthria, negative expressive aphasia, negative receptive aphasia.  .     Data Reviewed: Care during the described time interval was provided by me .  I have reviewed this patient's available data, including medical history, events of note, physical examination, and all test results as part of my evaluation.   CBC: Recent Labs  Lab 05/28/19 0935 05/30/19 0943 05/31/19 0711 06/02/19 0634  WBC 4.2 4.8 7.9 9.6  NEUTROABS 3.1 3.9  --  8.7*  HGB 14.4 14.0 14.1 13.4  HCT 41.9 41.0 41.2 38.7*  MCV 91.9 91.7 91.2 93.5  PLT 135* 144* 153 [id number]   Basic Metabolic Panel: Recent Labs  Lab 05/28/19 0935 05/30/19 0943 05/31/19 0711 06/02/19 0634  NA 138 138 140 138  K 3.8 4.0 4.1 4.4  CL 100 102 106 105  CO2 27 23 24  20*  GLUCOSE 96 93 177* 175*  BUN 25* 25* 30* 33*  CREATININE 1.31* 1.15 1.11 1.08  CALCIUM 9.0 8.7* 8.5* 8.2*   GFR: Estimated Creatinine Clearance: 61.9 mL/min (by C-G formula based on SCr of 1.08 mg/dL). Liver Function Tests: Recent Labs  Lab 05/30/19 0943 06/02/19 0634  AST 37 44*  ALT 59* 63*  ALKPHOS 37* 43  BILITOT 0.6 0.4  PROT 6.4* 6.0*  ALBUMIN 3.5 2.9*   No results for input(s): LIPASE, AMYLASE in the last 168 hours. No results for input(s): AMMONIA in the last 168 hours. Coagulation Profile: No results for input(s): INR, PROTIME in the last 168 hours. Cardiac Enzymes: No results for input(s): CKTOTAL, CKMB, CKMBINDEX, TROPONINI in the last 168 hours. BNP (last 3 results) No results for input(s): PROBNP in the last 8760 hours. HbA1C: No  results for input(s): HGBA1C in the last 72 hours. CBG: Recent Labs  Lab 06/02/19 0947 06/02/19 1152 06/02/19 1655  GLUCAP 237* 245* 210*   Lipid Profile: No results for input(s): CHOL, HDL, LDLCALC, TRIG, CHOLHDL, LDLDIRECT in the last 72 hours. Thyroid Function Tests: No results for input(s): TSH, T4TOTAL, FREET4, T3FREE, THYROIDAB in the last 72 hours. Anemia Panel: No results for input(s): VITAMINB12, FOLATE, FERRITIN, TIBC, IRON, RETICCTPCT in the last 72 hours. Urine analysis:    Component Value Date/Time   COLORURINE yellow 07/02/2008 0832   APPEARANCEUR Clear 07/02/2008 0832   LABSPEC 1.015 07/02/2008 0832   PHURINE 5.5 07/02/2008 0832   HGBUR negative 07/02/2008 0832   BILIRUBINUR negative 07/02/2008 0832   UROBILINOGEN 0.2 07/02/2008 0832   NITRITE negative 07/02/2008 0832   Sepsis Labs: @LABRCNTIP (procalcitonin:4,lacticidven:4)  ) Recent Results (from the past 240 hour(s))  Culture, blood (routine x 2)     Status: None (Preliminary result)  Collection Time: 05/30/19  9:44 AM   Specimen: BLOOD  Result Value Ref Range Status   Specimen Description BLOOD LEFT ARM  Final   Special Requests   Final    BOTTLES DRAWN AEROBIC AND ANAEROBIC Blood Culture adequate volume   Culture   Final    NO GROWTH 4 DAYS Performed at [facility], [address], [place], [place] [address]    Report Status PENDING  Incomplete  Culture, blood (routine x 2)     Status: None (Preliminary result)   Collection Time: 05/30/19  9:44 AM   Specimen: BLOOD  Result Value Ref Range Status   Specimen Description BLOOD RIGHT HAND  Final   Special Requests   Final    BOTTLES DRAWN AEROBIC AND ANAEROBIC Blood Culture adequate volume   Culture   Final    NO GROWTH 4 DAYS Performed at [facility], [place], [place] [address]    Report Status PENDING  Incomplete  SARS CORONAVIRUS 2 (TAT 6-24 HRS) Nasopharyngeal Nasopharyngeal Swab     Status: Abnormal    Collection Time: 05/31/19  7:44 AM   Specimen: Nasopharyngeal Swab  Result Value Ref Range Status   SARS Coronavirus 2 POSITIVE (A) NEGATIVE Final    Comment: CRITICAL RESULT CALLED TO, READ BACK BY AND VERIFIED WITH: S.SUMMERS RN 1640 05/31/2019 MCCORMICK K (NOTE) SARS-CoV-2 target nucleic acids are DETECTED. The SARS-CoV-2 RNA is generally detectable in upper and lower respiratory specimens during the acute phase of infection. Positive results are indicative of active infection with SARS-CoV-2. Clinical  correlation with patient history and other diagnostic information is necessary to determine patient infection status. Positive results do  not rule out bacterial infection or co-infection with other viruses. The expected result is Negative. Fact Sheet for Patients: SugarRoll.be Fact Sheet for Healthcare Providers: https://www.Dodie Parisi-mathews.com/ This test is not yet approved or cleared by the [place] FDA and  has been authorized for detection and/or diagnosis of SARS-CoV-2 by FDA under an Emergency Use Authorization (EUA). This EUA will remain  in effect (meaning this test can  be used) for the duration of the COVID-19 declaration under Section 564(b)(1) of the Act, 21 U.S.C. section 360bbb-3(b)(1), unless the authorization is terminated or revoked sooner. Performed at [facility], [place] [address], [place], [place] [address]          Radiology Studies: Dg Chest [place] 1 View  Result Date: 06/02/2019 CLINICAL DATA:  78 year old male with history of chest pain. COVID positive patient. EXAM: PORTABLE CHEST 1 VIEW COMPARISON:  Chest x-ray 05/30/2019. FINDINGS: Lung volumes are low. Patchy multifocal interstitial and airspace disease scattered asymmetrically throughout the lungs bilaterally, increased compared to the prior study, most confluent in the left mid to lower lung. No pleural effusions. No evidence of pulmonary edema.  Heart size is normal. Upper mediastinal contours are within normal limits. Status post median sternotomy. IMPRESSION: 1. Increasing severe multilobar bilateral pneumonia, as above. Electronically Signed   By: Vinnie Langton M.D.   On: 06/02/2019 18:04        Scheduled Meds:  sodium chloride   Intravenous Once   Chlorhexidine Gluconate Cloth  6 each Topical Daily   enoxaparin (LOVENOX) injection  40 mg Subcutaneous Q12H   ezetimibe  10 mg Oral QHS   fesoterodine  4 mg Oral Daily   gabapentin  300 mg Oral TID   insulin aspart  0-5 Units Subcutaneous QHS   insulin aspart  0-9 Units Subcutaneous TID  WC   isosorbide mononitrate  30 mg Oral Daily   mouth rinse  15 mL Mouth Rinse BID   methylPREDNISolone (SOLU-MEDROL) injection  40 mg Intravenous Q8H   simvastatin  40 mg Oral QHS   sodium chloride flush  3 mL Intravenous Q12H   vitamin C  500 mg Oral Daily   zinc sulfate  220 mg Oral Daily   Continuous Infusions:  sodium chloride     sodium chloride 10 mL/hr at 06/03/19 0700   remdesivir 100 mg in NS 250 mL 100 mg (06/02/19 1856)     LOS: 1 day   The patient is critically ill with multiple organ systems failure and requires high complexity decision making for assessment and support, frequent evaluation and titration of therapies, application of advanced monitoring technologies and extensive interpretation of multiple databases. Critical Care Time devoted to patient care services described in this note  Time spent: 40 minutes     Addam Goeller, Geraldo Docker, MD [facility] Pager [phone]  If 7PM-7AM, please contact night-coverage [email or web address] Password [facility] 06/03/2019, 7:38 AM

## 2019-06-03 NOTE — Progress Notes (Addendum)
Pt tolerated weaning 2 from heated high flow this morning to 6L High Flow, non heated at present; up to chair from 1130~1530 today; spoke to wife frequently throughout day providing updates.  MD started 12.5mg  Lopressor and restarted PRN Miralax which was given to pt.  5th dose of remdesivir infusing now; conv plasma completed 10/18.  IV Team consult order placed to obtain second site as patient is hard stick.  Transfer order in and will transport to Room 120. Report given to Seth Bake, RN - stated UV equipment still in room, will advise when confirmation cleaning is completed.

## 2019-06-04 DIAGNOSIS — F419 Anxiety disorder, unspecified: Secondary | ICD-10-CM | POA: Diagnosis present

## 2019-06-04 LAB — COMPREHENSIVE METABOLIC PANEL
ALT: 110 U/L — ABNORMAL HIGH (ref 0–44)
AST: 44 U/L — ABNORMAL HIGH (ref 15–41)
Albumin: 2.8 g/dL — ABNORMAL LOW (ref 3.5–5.0)
Alkaline Phosphatase: 40 U/L (ref 38–126)
Anion gap: 11 (ref 5–15)
BUN: 39 mg/dL — ABNORMAL HIGH (ref 8–23)
CO2: 26 mmol/L (ref 22–32)
Calcium: 8.5 mg/dL — ABNORMAL LOW (ref 8.9–10.3)
Chloride: 104 mmol/L (ref 98–111)
Creatinine, Ser: 1.05 mg/dL (ref 0.61–1.24)
GFR calc Af Amer: >60 mL/min (ref >60)
GFR calc non Af Amer: >60 mL/min (ref >60)
Glucose, Bld: 117 mg/dL — ABNORMAL HIGH (ref 70–99)
Potassium: 4.6 mmol/L (ref 3.5–5.1)
Sodium: 141 mmol/L (ref 135–145)
Total Bilirubin: 0.7 mg/dL (ref 0.3–1.2)
Total Protein: 5.7 g/dL — ABNORMAL LOW (ref 6.5–8.1)

## 2019-06-04 LAB — CBC WITH DIFFERENTIAL/PLATELET
Abs Immature Granulocytes: 0.17 10*3/uL — ABNORMAL HIGH (ref 0.00–0.07)
Basophils Absolute: 0 10*3/uL (ref 0.0–0.1)
Basophils Relative: 0 %
Eosinophils Absolute: 0 10*3/uL (ref 0.0–0.5)
Eosinophils Relative: 0 %
HCT: 40.2 % (ref 39.0–52.0)
Hemoglobin: 13.7 g/dL (ref 13.0–17.0)
Immature Granulocytes: 2 %
Lymphocytes Relative: 4 %
Lymphs Abs: 0.4 10*3/uL — ABNORMAL LOW (ref 0.7–4.0)
MCH: 31.9 pg (ref 26.0–34.0)
MCHC: 34.1 g/dL (ref 30.0–36.0)
MCV: 93.5 fL (ref 80.0–100.0)
Monocytes Absolute: 0.5 10*3/uL (ref 0.1–1.0)
Monocytes Relative: 4 %
Neutro Abs: 10.6 10*3/uL — ABNORMAL HIGH (ref 1.7–7.7)
Neutrophils Relative %: 90 %
Platelets: 217 10*3/uL (ref 150–400)
RBC: 4.3 MIL/uL (ref 4.22–5.81)
RDW: 12.1 % (ref 11.5–15.5)
WBC: 11.7 10*3/uL — ABNORMAL HIGH (ref 4.0–10.5)
nRBC: 0 % (ref 0.0–0.2)

## 2019-06-04 LAB — GLUCOSE, CAPILLARY
Glucose-Capillary: 109 mg/dL — ABNORMAL HIGH (ref 70–99)
Glucose-Capillary: 123 mg/dL — ABNORMAL HIGH (ref 70–99)
Glucose-Capillary: 181 mg/dL — ABNORMAL HIGH (ref 70–99)
Glucose-Capillary: 192 mg/dL — ABNORMAL HIGH (ref 70–99)
Glucose-Capillary: 263 mg/dL — ABNORMAL HIGH (ref 70–99)
Glucose-Capillary: 264 mg/dL — ABNORMAL HIGH (ref 70–99)
Glucose-Capillary: 83 mg/dL (ref 70–99)

## 2019-06-04 LAB — CULTURE, BLOOD (ROUTINE X 2)
Culture: NO GROWTH
Culture: NO GROWTH
Special Requests: ADEQUATE
Special Requests: ADEQUATE

## 2019-06-04 LAB — LIPID PANEL
Cholesterol: 109 mg/dL (ref 0–200)
HDL: 33 mg/dL — ABNORMAL LOW (ref >40)
LDL Cholesterol: 62 mg/dL (ref 0–99)
Total CHOL/HDL Ratio: 3.3 RATIO
Triglycerides: 68 mg/dL (ref <150)
VLDL: 14 mg/dL (ref 0–40)

## 2019-06-04 LAB — D-DIMER, QUANTITATIVE: D-Dimer, Quant: 0.84 ug/mL-FEU — ABNORMAL HIGH (ref 0.00–0.50)

## 2019-06-04 LAB — C-REACTIVE PROTEIN: CRP: 1.3 mg/dL — ABNORMAL HIGH (ref <1.0)

## 2019-06-04 LAB — MAGNESIUM: Magnesium: 2.3 mg/dL (ref 1.7–2.4)

## 2019-06-04 MED ORDER — LISINOPRIL 2.5 MG PO TABS
2.5000 mg | ORAL_TABLET | Freq: Every day | ORAL | Status: DC
  Administered 2019-06-04 – 2019-06-09 (×6): 2.5 mg via ORAL
  Filled 2019-06-04 (×7): qty 1

## 2019-06-04 MED ORDER — LORAZEPAM 2 MG/ML IJ SOLN
0.5000 mg | Freq: Three times a day (TID) | INTRAMUSCULAR | Status: DC
  Administered 2019-06-04 – 2019-06-05 (×4): 0.5 mg via INTRAVENOUS
  Filled 2019-06-04 (×4): qty 1

## 2019-06-04 MED ORDER — LIVING WELL WITH DIABETES BOOK
Freq: Once | Status: AC
  Administered 2019-06-04: 12:00:00
  Filled 2019-06-04: qty 1

## 2019-06-04 NOTE — Progress Notes (Signed)
Patient desat on 15L HFNC to 84% without recovery. Patient asked to prone, patient refused. Patient placed on 15LHFNC and NRB. MD notified, new orders placed. RT notified. RRT nurse was informed while rounding via Magazine features editor as well as Camera operator. Patient educated about proning, patient turned onto right side to help oxygenation by nursing staff. Patient resistant to education about prone position and oxygenation requirements. Patient given 0.5 mg ativan per order for anxiety.  Will continue to monitor and continue with POC.

## 2019-06-04 NOTE — Progress Notes (Addendum)
Inpatient Diabetes Program Recommendations  AACE/ADA: New Consensus Statement on Inpatient Glycemic Control (2015)  Target Ranges:  Prepandial:   less than 140 mg/dL      Peak postprandial:   less than 180 mg/dL (1-2 hours)      Critically ill patients:  140 - 180 mg/dL   Lab Results  Component Value Date   GLUCAP 123 (H) 06/04/2019   HGBA1C 6.4 12/04/2018    Review of Glycemic Control Results for MOSSES, MCDILL" (MRN [id number]) as of 06/04/2019 10:19  Ref. Range 06/03/2019 16:17 06/04/2019 00:35 06/04/2019 03:48 06/04/2019 08:06  Glucose-Capillary Latest Ref Range: 70 - 99 mg/dL 205 (H) 83 109 (H) 123 (H)   Diabetes history: Pre-diabetes Outpatient Diabetes medications:  None Current orders for Inpatient glycemic control:  Novolog resistant q 4 hours Lantus 8 units daily  Inpatient Diabetes Program Recommendations:    Note new pre-DM diagnosis.  Will order Living Well with DM booklet as well and speak with patient as appropriate.   Thanks  Adah Perl, RN, BC-ADM Inpatient Diabetes Coordinator Pager [phone] (8a-5p)

## 2019-06-04 NOTE — Progress Notes (Signed)
RT called to evaluate pt at this time d/t desat with activity and need for more O2.  RT entered to find pt on 10LPM HFNC salter and NRB at 15LPM with sats of 97%.  RT turned HFNC salter to 5Lpm, then off and pt has continued to hold sat of 96%.  Pt is resting and still at this time.  RT will inform RN of changes and recommend titration back to salter only as tolerated.  RT will monitor as necessary.

## 2019-06-04 NOTE — Progress Notes (Signed)
Patient provided with education about oxygenation requirements and movement. Patient asked to get OOB to use restroom. Patient educated about needing to use bedpan at present time d/t lack of reserve and increased oxygen requirement throughout the night.  Patient upset about education provided. Bedpan bedside and assistance offered, patient declined. Patient refused to prone, assisted with Tracie RN turning to left side. Patient oxygen dropped to 88% with turn. Patient states "we are setting off his back", pain medication offered, patient declined intervention. Will continue to monitor and continue with POC.

## 2019-06-04 NOTE — Progress Notes (Addendum)
PROGRESS NOTE    [name]  [id number] DOB: 1941-01-26 DOA: 06/02/2019 PCP: Ria Bush, MD   Brief Narrative:  78yo WM PMHx  CAD s/p CABG 1990, PVD, prediabetes, OSA not on CPAP, HTN and HLD   who was admitted at [facility] 10/15 with covid-19 pneumonia causing hypoxic respiratory failure after initially testing positive in presurgical screening 10/5. Remdesivir and steroids were providded until he was ultimately transferred to [facility] this AM and admitted by Dr. Shanon Brow. Feels about the same as he has, but oxygen requirement seems to be increasing. He's hungry, feels short of breath with any exertion. No chest pain, no leg swelling.    Subjective: 10/20 A/O x4, negative CP, negative abdominal pain.  Positive S OB..  Patient with agitation/anxiety, about everything from type of bed he is in, to not being able to walk to the bathroom, to having to take medication..  Request that his daughter Arrie Aran be sensible POC (she is an Therapist, sports)     Assessment & Plan:   Principal Problem:   Pneumonia due to COVID-19 virus Active Problems:   Essential hypertension   Carotid stenosis   PVD (peripheral vascular disease) with claudication (HCC)   DVT, HX OF   Cervical stenosis of spinal canal   CAD (coronary artery disease), native coronary artery   Acute respiratory failure due to COVID-19 [facility])   Anxiety   Covid pneumonia/acute respiratory failure with hypoxia -10/18 transfuse 1 unit Covid convalescent plasma -10/20 decrease Solu-Medrol 40 mg BID - Remdesivirper pharmacy protocol -10/20 on HFNC 15 L/min  -Prone patient/male inside 2 to 3 hours per shift.  Out of bed to chair 2 to 3 hours per shift  COVID-19 Labs  Recent Labs    06/02/19 0634 06/03/19 0545 06/04/19 0540  DDIMER 0.73* 0.75* 0.84*  CRP 4.7* 2.5* 1.3*    Lab Results  Component Value Date   SARSCOV2NAA POSITIVE (A) 05/31/2019   SARSCOV2NAA POSITIVE (A) 05/20/2019  -Continuous O2 monitoring -Titrate O2 to maintain  SPO2> 87%  Prediabetes with hyperglycemia -4/21 hemoglobin A1c= 6.4 -10/19 start Lantus 8 units daily -10/19 increase resistant SSI -10/20 consult to diabetic coordinator -10/20 consult to diabetic nutritionist  HLD -Lipid panel pending -Zetia 10 mg daily  Essential HTN -ASA 81 mg daily -Imdur 30 mg daily -10/19 start Metoprolol 12.5 mg BID -10/20 start lisinopril 2.5 mg daily  CAD s/p CABG -See HTN,   PVD/carotid stenosis -See HTN, prediabetes, HLD  Anxiety/agitation -Steroids may be causing/exacerbating patient's anxiety.  Have decreased steroid dose, but explained the patient needs minimum 10 days of steroids for Covid infection -10/20 Ativan IV 0.5 mg TID.  If takes edge off agitation/anxiety convert to p.o.     DVT prophylaxis: Lovenox Code Status: Full Family Communication: 10/20  spoke with RN Arrie Aran (daughter), explained plan of care answered all questions Disposition Plan: TBD   Consultants:    Procedures/Significant Events:     I have personally reviewed and interpreted all radiology studies and my findings are as above.  VENTILATOR SETTINGS: HFNC Flow rate; 15 L/min SPO2 91%   Cultures   Antimicrobials: Anti-infectives (From admission, onward)   Start     Stop   06/02/19 1600  remdesivir 100 mg in sodium chloride 0.9 % 250 mL IVPB     06/04/19 1559       Devices    LINES / TUBES:      Continuous Infusions: . sodium chloride    . sodium chloride Stopped (06/03/19 1842)  Objective: Vitals:   06/04/19 0300 06/04/19 0400 06/04/19 0459 06/04/19 0805  BP:  (!) 146/61  (!) 168/74  Pulse: 62 63  60  Resp:  (!) 22  20  Temp:  98.2 F (36.8 C)  98.4 F (36.9 C)  TempSrc:  Oral  Oral  SpO2: 96% 90% 93% 97%  Weight:      Height:        Intake/Output Summary (Last 24 hours) at 06/04/2019 1007 Last data filed at 06/04/2019 0500 Gross per 24 hour  Intake 1050 ml  Output 2250 ml  Net -1200 ml   Filed Weights   06/02/19  0614  Weight: 92 kg   Physical Exam:  General: A/O x4, agitated, positive acute respiratory distress Eyes: negative scleral hemorrhage, negative anisocoria, negative icterus ENT: Negative Runny nose, negative gingival bleeding, Neck:  Negative scars, masses, torticollis, lymphadenopathy, JVD Lungs: Tachypneic, clear to auscultation bilaterally without wheezes or crackles Cardiovascular: Tachycardic without murmur gallop or rub normal S1 and S2 Abdomen: negative abdominal pain, nondistended, positive soft, bowel sounds, no rebound, no ascites, no appreciable mass Extremities: No significant cyanosis, clubbing, or edema bilateral lower extremities Skin: Negative rashes, lesions, ulcers Psychiatric:  Negative depression, positive anxiety, negative fatigue, negative mania, positive agitation Central nervous system:  Cranial nerves II through XII intact, tongue/uvula midline, all extremities muscle strength 5/5, sensation intact throughout,negative dysarthria, negative expressive aphasia, negative receptive aphasia.  .     Data Reviewed: Care during the described time interval was provided by me .  I have reviewed this patient's available data, including medical history, events of note, physical examination, and all test results as part of my evaluation.   CBC: Recent Labs  Lab 05/30/19 0943 05/31/19 0711 06/02/19 0634 06/03/19 0545 06/04/19 0540  WBC 4.8 7.9 9.6 9.4 11.7*  NEUTROABS 3.9  --  8.7* 8.4* 10.6*  HGB 14.0 14.1 13.4 12.8* 13.7  HCT 41.0 41.2 38.7* 37.8* 40.2  MCV 91.7 91.2 93.5 93.6 93.5  PLT 144* 153 186 201 [id number]   Basic Metabolic Panel: Recent Labs  Lab 05/30/19 0943 05/31/19 0711 06/02/19 0634 06/03/19 0545 06/04/19 0540  NA 138 140 138 138 141  K 4.0 4.1 4.4 4.1 4.6  CL 102 106 105 102 104  CO2 23 24 20* 25 26  GLUCOSE 93 177* 175* 267* 117*  BUN 25* 30* 33* 47* 39*  CREATININE 1.15 1.11 1.08 1.20 1.05  CALCIUM 8.7* 8.5* 8.2* 8.4* 8.5*  MG  --   --   --   2.2 2.3  PHOS  --   --   --  4.6  --    GFR: Estimated Creatinine Clearance: 63.6 mL/min (by C-G formula based on SCr of 1.05 mg/dL). Liver Function Tests: Recent Labs  Lab 05/30/19 0943 06/02/19 0634 06/03/19 0545 06/04/19 0540  AST 37 44* 48* 44*  ALT 59* 63* 100* 110*  ALKPHOS 37* 43 40 40  BILITOT 0.6 0.4 1.1 0.7  PROT 6.4* 6.0* 5.9* 5.7*  ALBUMIN 3.5 2.9* 3.0* 2.8*   No results for input(s): LIPASE, AMYLASE in the last 168 hours. No results for input(s): AMMONIA in the last 168 hours. Coagulation Profile: No results for input(s): INR, PROTIME in the last 168 hours. Cardiac Enzymes: No results for input(s): CKTOTAL, CKMB, CKMBINDEX, TROPONINI in the last 168 hours. BNP (last 3 results) No results for input(s): PROBNP in the last 8760 hours. HbA1C: No results for input(s): HGBA1C in the last 72 hours. CBG: Recent Labs  Lab  06/03/19 1200 06/03/19 1617 06/04/19 0035 06/04/19 0348 06/04/19 0806  GLUCAP 370* 205* 83 109* 123*   Lipid Profile: Recent Labs    06/04/19 0540  CHOL 109  HDL 33*  LDLCALC 62  TRIG 68  CHOLHDL 3.3   Thyroid Function Tests: No results for input(s): TSH, T4TOTAL, FREET4, T3FREE, THYROIDAB in the last 72 hours. Anemia Panel: No results for input(s): VITAMINB12, FOLATE, FERRITIN, TIBC, IRON, RETICCTPCT in the last 72 hours. Urine analysis:    Component Value Date/Time   COLORURINE yellow 07/02/2008 0832   APPEARANCEUR Clear 07/02/2008 0832   LABSPEC 1.015 07/02/2008 0832   PHURINE 5.5 07/02/2008 0832   HGBUR negative 07/02/2008 0832   BILIRUBINUR negative 07/02/2008 0832   UROBILINOGEN 0.2 07/02/2008 0832   NITRITE negative 07/02/2008 0832   Sepsis Labs: @LABRCNTIP (procalcitonin:4,lacticidven:4)  ) Recent Results (from the past 240 hour(s))  Culture, blood (routine x 2)     Status: None   Collection Time: 05/30/19  9:44 AM   Specimen: BLOOD  Result Value Ref Range Status   Specimen Description BLOOD LEFT ARM  Final    Special Requests   Final    BOTTLES DRAWN AEROBIC AND ANAEROBIC Blood Culture adequate volume   Culture   Final    NO GROWTH 5 DAYS Performed at [facility], [address] [place], [place] [address]    Report Status 06/04/2019 FINAL  Final  Culture, blood (routine x 2)     Status: None   Collection Time: 05/30/19  9:44 AM   Specimen: BLOOD  Result Value Ref Range Status   Specimen Description BLOOD RIGHT HAND  Final   Special Requests   Final    BOTTLES DRAWN AEROBIC AND ANAEROBIC Blood Culture adequate volume   Culture   Final    NO GROWTH 5 DAYS Performed at [facility], [address], [name], [place] [address]    Report Status 06/04/2019 FINAL  Final  SARS CORONAVIRUS 2 (TAT 6-24 HRS) Nasopharyngeal Nasopharyngeal Swab     Status: Abnormal   Collection Time: 05/31/19  7:44 AM   Specimen: Nasopharyngeal Swab  Result Value Ref Range Status   SARS Coronavirus 2 POSITIVE (A) NEGATIVE Final    Comment: CRITICAL RESULT CALLED TO, READ BACK BY AND VERIFIED WITH: S.SUMMERS RN 1640 05/31/2019 MCCORMICK K (NOTE) SARS-CoV-2 target nucleic acids are DETECTED. The SARS-CoV-2 RNA is generally detectable in upper and lower respiratory specimens during the acute phase of infection. Positive results are indicative of active infection with SARS-CoV-2. Clinical  correlation with patient history and other diagnostic information is necessary to determine patient infection status. Positive results do  not rule out bacterial infection or co-infection with other viruses. The expected result is Negative. Fact Sheet for Patients: SugarRoll.be Fact Sheet for Healthcare Providers: https://www.Maylon Sailors-mathews.com/ This test is not yet approved or cleared by the [place] FDA and  has been authorized for detection and/or diagnosis of SARS-CoV-2 by FDA under an Emergency Use Authorization (EUA). This EUA will remain  in effect (meaning  this test can  be used) for the duration of the COVID-19 declaration under Section 564(b)(1) of the Act, 21 U.S.C. section 360bbb-3(b)(1), unless the authorization is terminated or revoked sooner. Performed at [facility], [place] [address], [place], [place] [address]          Radiology Studies: Dg Chest [place] 1 View  Result Date: 06/02/2019 CLINICAL DATA:  78 year old male with history of chest pain. COVID positive patient. EXAM: PORTABLE CHEST  1 VIEW COMPARISON:  Chest x-ray 05/30/2019. FINDINGS: Lung volumes are low. Patchy multifocal interstitial and airspace disease scattered asymmetrically throughout the lungs bilaterally, increased compared to the prior study, most confluent in the left mid to lower lung. No pleural effusions. No evidence of pulmonary edema. Heart size is normal. Upper mediastinal contours are within normal limits. Status post median sternotomy. IMPRESSION: 1. Increasing severe multilobar bilateral pneumonia, as above. Electronically Signed   By: Vinnie Langton M.D.   On: 06/02/2019 18:04        Scheduled Meds: . sodium chloride   Intravenous Once  . aspirin EC  81 mg Oral Daily  . Chlorhexidine Gluconate Cloth  6 each Topical Daily  . enoxaparin (LOVENOX) injection  40 mg Subcutaneous Q12H  . ezetimibe  10 mg Oral QHS  . fesoterodine  4 mg Oral Daily  . gabapentin  300 mg Oral TID  . insulin aspart  0-20 Units Subcutaneous Q4H  . insulin glargine  8 Units Subcutaneous Daily  . isosorbide mononitrate  30 mg Oral Daily  . lisinopril  2.5 mg Oral Daily  . LORazepam  0.5 mg Intravenous TID  . mouth rinse  15 mL Mouth Rinse BID  . methylPREDNISolone (SOLU-MEDROL) injection  40 mg Intravenous Q8H  . metoprolol tartrate  12.5 mg Oral BID  . simvastatin  40 mg Oral QHS  . sodium chloride flush  3 mL Intravenous Q12H  . vitamin C  500 mg Oral Daily  . zinc sulfate  220 mg Oral Daily   Continuous Infusions: . sodium chloride    . sodium chloride  Stopped (06/03/19 1842)     LOS: 2 days   The patient is critically ill with multiple organ systems failure and requires high complexity decision making for assessment and support, frequent evaluation and titration of therapies, application of advanced monitoring technologies and extensive interpretation of multiple databases. Critical Care Time devoted to patient care services described in this note  Time spent: 40 minutes     Yailin Biederman, Geraldo Docker, MD [facility] Pager [phone]  If 7PM-7AM, please contact night-coverage [email or web address] Password [email or web address] 06/04/2019, 10:07 AM

## 2019-06-04 NOTE — Progress Notes (Signed)
Spoke with [employer] and answered all questions.

## 2019-06-05 ENCOUNTER — Other Ambulatory Visit: Payer: PPO

## 2019-06-05 LAB — COMPREHENSIVE METABOLIC PANEL
ALT: 100 U/L — ABNORMAL HIGH (ref 0–44)
AST: 37 U/L (ref 15–41)
Albumin: 2.8 g/dL — ABNORMAL LOW (ref 3.5–5.0)
Alkaline Phosphatase: 40 U/L (ref 38–126)
Anion gap: 12 (ref 5–15)
BUN: 44 mg/dL — ABNORMAL HIGH (ref 8–23)
CO2: 22 mmol/L (ref 22–32)
Calcium: 8.3 mg/dL — ABNORMAL LOW (ref 8.9–10.3)
Chloride: 102 mmol/L (ref 98–111)
Creatinine, Ser: 1.13 mg/dL (ref 0.61–1.24)
GFR calc Af Amer: >60 mL/min (ref >60)
GFR calc non Af Amer: >60 mL/min (ref >60)
Glucose, Bld: 107 mg/dL — ABNORMAL HIGH (ref 70–99)
Potassium: 4.7 mmol/L (ref 3.5–5.1)
Sodium: 136 mmol/L (ref 135–145)
Total Bilirubin: 1 mg/dL (ref 0.3–1.2)
Total Protein: 5.7 g/dL — ABNORMAL LOW (ref 6.5–8.1)

## 2019-06-05 LAB — CBC WITH DIFFERENTIAL/PLATELET
Abs Immature Granulocytes: 0.23 10*3/uL — ABNORMAL HIGH (ref 0.00–0.07)
Basophils Absolute: 0 10*3/uL (ref 0.0–0.1)
Basophils Relative: 0 %
Eosinophils Absolute: 0 10*3/uL (ref 0.0–0.5)
Eosinophils Relative: 0 %
HCT: 43.5 % (ref 39.0–52.0)
Hemoglobin: 14.6 g/dL (ref 13.0–17.0)
Immature Granulocytes: 2 %
Lymphocytes Relative: 4 %
Lymphs Abs: 0.5 10*3/uL — ABNORMAL LOW (ref 0.7–4.0)
MCH: 31.6 pg (ref 26.0–34.0)
MCHC: 33.6 g/dL (ref 30.0–36.0)
MCV: 94.2 fL (ref 80.0–100.0)
Monocytes Absolute: 0.6 10*3/uL (ref 0.1–1.0)
Monocytes Relative: 5 %
Neutro Abs: 10.6 10*3/uL — ABNORMAL HIGH (ref 1.7–7.7)
Neutrophils Relative %: 89 %
Platelets: 227 10*3/uL (ref 150–400)
RBC: 4.62 MIL/uL (ref 4.22–5.81)
RDW: 12 % (ref 11.5–15.5)
WBC: 11.8 10*3/uL — ABNORMAL HIGH (ref 4.0–10.5)
nRBC: 0 % (ref 0.0–0.2)

## 2019-06-05 LAB — GLUCOSE, CAPILLARY
Glucose-Capillary: 185 mg/dL — ABNORMAL HIGH (ref 70–99)
Glucose-Capillary: 123 mg/dL — ABNORMAL HIGH (ref 70–99)
Glucose-Capillary: 108 mg/dL — ABNORMAL HIGH (ref 70–99)
Glucose-Capillary: 255 mg/dL — ABNORMAL HIGH (ref 70–99)
Glucose-Capillary: 275 mg/dL — ABNORMAL HIGH (ref 70–99)

## 2019-06-05 LAB — MAGNESIUM: Magnesium: 2.4 mg/dL (ref 1.7–2.4)

## 2019-06-05 LAB — C-REACTIVE PROTEIN: CRP: 0.9 mg/dL (ref <1.0)

## 2019-06-05 LAB — D-DIMER, QUANTITATIVE: D-Dimer, Quant: 0.86 ug/mL-FEU — ABNORMAL HIGH (ref 0.00–0.50)

## 2019-06-05 MED ORDER — INSULIN ASPART 100 UNIT/ML ~~LOC~~ SOLN
0.0000 [IU] | Freq: Every day | SUBCUTANEOUS | Status: DC
  Administered 2019-06-05: 3 [IU] via SUBCUTANEOUS
  Administered 2019-06-06 – 2019-06-07 (×2): 2 [IU] via SUBCUTANEOUS
  Administered 2019-06-08: 4 [IU] via SUBCUTANEOUS

## 2019-06-05 MED ORDER — INSULIN ASPART 100 UNIT/ML ~~LOC~~ SOLN
0.0000 [IU] | Freq: Three times a day (TID) | SUBCUTANEOUS | Status: DC
  Administered 2019-06-06: 7 [IU] via SUBCUTANEOUS
  Administered 2019-06-06: 4 [IU] via SUBCUTANEOUS
  Administered 2019-06-06: 7 [IU] via SUBCUTANEOUS
  Administered 2019-06-07: 4 [IU] via SUBCUTANEOUS
  Administered 2019-06-07 – 2019-06-08 (×2): 7 [IU] via SUBCUTANEOUS
  Administered 2019-06-08 (×2): 4 [IU] via SUBCUTANEOUS
  Administered 2019-06-09: 3 [IU] via SUBCUTANEOUS
  Administered 2019-06-09: 7 [IU] via SUBCUTANEOUS

## 2019-06-05 NOTE — Progress Notes (Signed)
Nutrition Consult   RD consulted for nutrition education regarding pre-diabetes. Patient states that he's "not usually diabetic" but the steroids are making his blood sugar high. He usually follows a regular diet, avoiding foods that bother his stomach, such as bananas and salads. Current appetite is good. He reports no recent weight loss.   Lab Results  Component Value Date   HGBA1C 6.4 12/04/2018    RD discussed a healthy diet to follow for pre-diabetes. Attached diabetes education information to discharge instructions.   Discussed different food groups and their effects on blood sugar, emphasizing carbohydrate-containing foods. Reviewed importance of controlled and consistent carbohydrate intake throughout the day. Provided examples of ways to balance meals/snacks and encouraged intake of high-fiber, whole grain complex carbohydrates. Teach back method used.  Expect fair compliance.  Body mass index is 27.51 kg/m. Pt meets criteria for overweight based on current BMI.  Current diet order is heart healthy, patient is consuming approximately 75% of meals at this time. Labs and medications reviewed. No further nutrition interventions warranted at this time. RD contact information provided. If additional nutrition issues arise, please re-consult RD.  Molli Barrows, RD, LDN, [place] Pager [phone] After Hours Pager [phone]

## 2019-06-05 NOTE — Progress Notes (Signed)
PROGRESS NOTE    [name]  [id number] DOB: 03-May-1941 DOA: 06/02/2019 PCP: Ria Bush, MD   Brief Narrative:  78yo WM PMHx  CAD s/p CABG 1990, PVD, prediabetes, OSA not on CPAP, HTN and HLD who was admitted at [facility] 10/15 with covid-19 pneumonia causing hypoxic respiratory failure after initially testing positive in presurgical screening 10/5. Remdesivir and steroids were providded until he was ultimately transferred to [facility] this AM and admitted by Dr. Shanon Brow. Feels about the same as he has, but oxygen requirement seems to be increasing. He's hungry, feels short of breath with any exertion. No chest pain, no leg swelling.    Subjective: No acute issues or events overnight.admits to generally feeling much improved from previous but not yet back to baseline.  Otherwise denies chest pain, shortness of breath, nausea, vomiting, diarrhea, constipation, headache, fevers, chills.  Assessment & Plan:   Principal Problem:   Pneumonia due to COVID-19 virus Active Problems:   Essential hypertension   Carotid stenosis   PVD (peripheral vascular disease) with claudication (HCC)   DVT, HX OF   Cervical stenosis of spinal canal   CAD (coronary artery disease), native coronary artery   Acute respiratory failure due to COVID-19 [facility])   Anxiety   Covid pneumonia/acute respiratory failure with hypoxia -10/18 transfuse 1 unit Covid convalescent plasma -10/20 decrease Solu-Medrol 40 mg BID -will likely need prolonged steroid taper - Remdesivir per pharmacy protocol -now completed -Continues on HFNC 10L/min  -Continue to prone, use incentive spirometry and flutter as tolerated, out of bed as tolerated Recent Labs    06/03/19 0545 06/04/19 0540  DDIMER 0.75* 0.84*  CRP 2.5* 1.3*    Prediabetes with hyperglycemia in the setting of steroids as above -4/21 hemoglobin A1c= 6.4 -Continue Lantus, sliding scale, hypoglycemic protocol -Diabetic coordinator following, nutritionist  following  HLD -Lipid panel unremarkable other than minimally low HDL -Continue Zetia 10 mg daily  Essential HTN -ASA 81 mg daily -Imdur 30 mg daily -Continue metoprolol 12.5 mg BID, lisinopril 2.5 mg daily  CAD s/p CABG -Continue ACE, beta-blocker, aspirin as above  PVD/carotid stenosis -See HTN, prediabetes, HLD  Anxiety/agitation -Steroids may be causing/exacerbating patient's anxiety.  Have decreased steroid dose, but explained the patient needs minimum 10 days of steroids for Covid infection -10/20 Ativan IV 0.5 mg TID.  If takes edge off agitation/anxiety convert to p.o.  DVT prophylaxis: Lovenox Code Status: Full Family Communication: 10/20  spoke with RN Arrie Aran (daughter), explained plan of care answered all questions Disposition Plan: Ending clinical improvement, hopeful disposition home per patient, may benefit from PT OT versus SNF placement pending ambulatory status  Antimicrobials: Anti-infectives (From admission, onward)   Start     Stop   06/02/19 1600  remdesivir 100 mg in sodium chloride 0.9 % 250 mL IVPB     06/04/19 1559     Continuous Infusions: . sodium chloride    . sodium chloride Stopped (06/03/19 1842)    Objective: Vitals:   06/04/19 2131 06/05/19 0000 06/05/19 0348 06/05/19 0815  BP: (!) 145/68 (!) 157/69 (!) 144/61 (!) 135/58  Pulse: 77 66 68 (!) 57  Resp:  19 18 18   Temp:  98.2 F (36.8 C) 98.7 F (37.1 C) 97.9 F (36.6 C)  TempSrc:  Oral Oral Oral  SpO2:  98% 99% 93%  Weight:      Height:        Intake/Output Summary (Last 24 hours) at 06/05/2019 0834 Last data filed at 06/05/2019 0500 Gross  per 24 hour  Intake 203 ml  Output 2250 ml  Net -2047 ml   Filed Weights   06/02/19 0614  Weight: 92 kg   Physical Exam:  General: A/O x4, no acute distress, sitting comfortably at bedside chair Eyes: negative scleral hemorrhage, negative anisocoria, negative icterus ENT: Negative Runny nose, negative gingival bleeding, Neck:   Negative scars, masses, torticollis, lymphadenopathy, JVD Lungs: Tachypneic, clear to auscultation bilaterally without wheezes or crackles Cardiovascular: Tachycardic without murmur gallop or rub normal S1 and S2 Abdomen: negative abdominal pain, nondistended, positive soft, bowel sounds, no rebound, no ascites, no appreciable mass Extremities: No significant cyanosis, clubbing, or edema bilateral lower extremities Skin: Negative rashes, lesions, ulcers Central nervous system:  Cranial nerves II through XII intact, tongue/uvula midline, all extremities muscle strength 5/5, sensation intact throughout,negative dysarthria, negative expressive aphasia, negative receptive aphasia.  Data Reviewed: Care during the described time interval was provided by me .  I have reviewed this patient's available data, including medical history, events of note, physical examination, and all test results as part of my evaluation.   CBC: Recent Labs  Lab 05/30/19 0943 05/31/19 0711 06/02/19 0634 06/03/19 0545 06/04/19 0540 06/05/19 0500  WBC 4.8 7.9 9.6 9.4 11.7* 11.8*  NEUTROABS 3.9  --  8.7* 8.4* 10.6* 10.6*  HGB 14.0 14.1 13.4 12.8* 13.7 14.6  HCT 41.0 41.2 38.7* 37.8* 40.2 43.5  MCV 91.7 91.2 93.5 93.6 93.5 94.2  PLT 144* 153 186 201 217 [id number]   Basic Metabolic Panel: Recent Labs  Lab 05/30/19 0943 05/31/19 0711 06/02/19 0634 06/03/19 0545 06/04/19 0540  NA 138 140 138 138 141  K 4.0 4.1 4.4 4.1 4.6  CL 102 106 105 102 104  CO2 23 24 20* 25 26  GLUCOSE 93 177* 175* 267* 117*  BUN 25* 30* 33* 47* 39*  CREATININE 1.15 1.11 1.08 1.20 1.05  CALCIUM 8.7* 8.5* 8.2* 8.4* 8.5*  MG  --   --   --  2.2 2.3  PHOS  --   --   --  4.6  --    GFR: Estimated Creatinine Clearance: 63.6 mL/min (by C-G formula based on SCr of 1.05 mg/dL). Liver Function Tests: Recent Labs  Lab 05/30/19 0943 06/02/19 0634 06/03/19 0545 06/04/19 0540  AST 37 44* 48* 44*  ALT 59* 63* 100* 110*  ALKPHOS 37* 43 40 40   BILITOT 0.6 0.4 1.1 0.7  PROT 6.4* 6.0* 5.9* 5.7*  ALBUMIN 3.5 2.9* 3.0* 2.8*   No results for input(s): LIPASE, AMYLASE in the last 168 hours. No results for input(s): AMMONIA in the last 168 hours. Coagulation Profile: No results for input(s): INR, PROTIME in the last 168 hours. Cardiac Enzymes: No results for input(s): CKTOTAL, CKMB, CKMBINDEX, TROPONINI in the last 168 hours. BNP (last 3 results) No results for input(s): PROBNP in the last 8760 hours. HbA1C: No results for input(s): HGBA1C in the last 72 hours. CBG: Recent Labs  Lab 06/04/19 0806 06/04/19 1116 06/04/19 1950 06/04/19 2338 06/05/19 0352  GLUCAP 123* 181* 263* 192* 123*   Lipid Profile: Recent Labs    06/04/19 0540  CHOL 109  HDL 33*  LDLCALC 62  TRIG 68  CHOLHDL 3.3   Urine analysis:    Component Value Date/Time   COLORURINE yellow 07/02/2008 0832   APPEARANCEUR Clear 07/02/2008 0832   LABSPEC 1.015 07/02/2008 0832   PHURINE 5.5 07/02/2008 0832   HGBUR negative 07/02/2008 0832   BILIRUBINUR negative 07/02/2008 0832   UROBILINOGEN 0.2  07/02/2008 0832   NITRITE negative 07/02/2008 0832   Recent Results (from the past 240 hour(s))  Culture, blood (routine x 2)     Status: None   Collection Time: 05/30/19  9:44 AM   Specimen: BLOOD  Result Value Ref Range Status   Specimen Description BLOOD LEFT ARM  Final   Special Requests   Final    BOTTLES DRAWN AEROBIC AND ANAEROBIC Blood Culture adequate volume   Culture   Final    NO GROWTH 5 DAYS Performed at [facility], [address], [place], [place] [address]    Report Status 06/04/2019 FINAL  Final  Culture, blood (routine x 2)     Status: None   Collection Time: 05/30/19  9:44 AM   Specimen: BLOOD  Result Value Ref Range Status   Specimen Description BLOOD RIGHT HAND  Final   Special Requests   Final    BOTTLES DRAWN AEROBIC AND ANAEROBIC Blood Culture adequate volume   Culture   Final    NO GROWTH 5 DAYS Performed at  [facility], [address], [place], [place] [address]    Report Status 06/04/2019 FINAL  Final  SARS CORONAVIRUS 2 (TAT 6-24 HRS) Nasopharyngeal Nasopharyngeal Swab     Status: Abnormal   Collection Time: 05/31/19  7:44 AM   Specimen: Nasopharyngeal Swab  Result Value Ref Range Status   SARS Coronavirus 2 POSITIVE (A) NEGATIVE Final    Comment: CRITICAL RESULT CALLED TO, READ BACK BY AND VERIFIED WITH: S.SUMMERS RN 1640 05/31/2019 MCCORMICK K (NOTE) SARS-CoV-2 target nucleic acids are DETECTED. The SARS-CoV-2 RNA is generally detectable in upper and lower respiratory specimens during the acute phase of infection. Positive results are indicative of active infection with SARS-CoV-2. Clinical  correlation with patient history and other diagnostic information is necessary to determine patient infection status. Positive results do  not rule out bacterial infection or co-infection with other viruses. The expected result is Negative. Fact Sheet for Patients: SugarRoll.be Fact Sheet for Healthcare Providers: https://www.woods-mathews.com/ This test is not yet approved or cleared by the [place] FDA and  has been authorized for detection and/or diagnosis of SARS-CoV-2 by FDA under an Emergency Use Authorization (EUA). This EUA will remain  in effect (meaning this test can  be used) for the duration of the COVID-19 declaration under Section 564(b)(1) of the Act, 21 U.S.C. section 360bbb-3(b)(1), unless the authorization is terminated or revoked sooner. Performed at [facility], [place] [address] [place], [place] [address]          Radiology Studies: No results found.      Scheduled Meds: . sodium chloride   Intravenous Once  . aspirin EC  81 mg Oral Daily  . Chlorhexidine Gluconate Cloth  6 each Topical Daily  . enoxaparin (LOVENOX) injection  40 mg Subcutaneous Q12H  . ezetimibe  10 mg Oral QHS  . fesoterodine   4 mg Oral Daily  . gabapentin  300 mg Oral TID  . insulin aspart  0-20 Units Subcutaneous Q4H  . insulin glargine  8 Units Subcutaneous Daily  . isosorbide mononitrate  30 mg Oral Daily  . lisinopril  2.5 mg Oral Daily  . LORazepam  0.5 mg Intravenous TID  . mouth rinse  15 mL Mouth Rinse BID  . methylPREDNISolone (SOLU-MEDROL) injection  40 mg Intravenous Q8H  . metoprolol tartrate  12.5 mg Oral BID  . simvastatin  40 mg Oral QHS  . sodium chloride flush  3  mL Intravenous Q12H  . vitamin C  500 mg Oral Daily  . zinc sulfate  220 mg Oral Daily   Continuous Infusions: . sodium chloride    . sodium chloride Stopped (06/03/19 1842)     LOS: 3 days    Little Ishikawa, DO [facility] Pager [phone]  If 7PM-7AM, please contact night-coverage [email or web address] Password [facility] 06/05/2019, 8:34 AM

## 2019-06-05 NOTE — Progress Notes (Signed)
Inpatient Diabetes Program Recommendations  AACE/ADA: New Consensus Statement on Inpatient Glycemic Control (2015)  Target Ranges:  Prepandial:   less than 140 mg/dL      Peak postprandial:   less than 180 mg/dL (1-2 hours)      Critically ill patients:  140 - 180 mg/dL   Lab Results  Component Value Date   GLUCAP 108 (H) 06/05/2019   HGBA1C 6.4 12/04/2018    Review of Glycemic Control Results for [name], [name] (MRN [id number]) as of 06/05/2019 09:59  Ref. Range 06/04/2019 15:30 06/04/2019 19:50 06/04/2019 23:38 06/05/2019 03:52 06/05/2019 08:14  Glucose-Capillary Latest Ref Range: 70 - 99 mg/dL 185 (H) 263 (H) 192 (H) 123 (H) 108 (H)   Diabetes history: Pre-Diabetes Outpatient Diabetes medications: None Current orders for Inpatient glycemic control:  Solumedrol 40 mg IV q 8 hours Novolog resistant tid with meals Lantus 8 units daily  Inpatient Diabetes Program Recommendations:    Referral received.  Called and spoke with patient by phone.  He states that he had his A1C in the past down to 5.8%.  He has a meter at home and states that when he checks his fasting blood sugars they are usually < 90 mg/dL.  He has eliminated sweetened beverages and "junk".  We discussed that his blood sugars are likely increased due to steroids.  Patient was on Prednisone taper prior to admit and is now on Solumedrol.  I anticipate that blood sugars will improve as steroids tapered.  Advised patient to check blood sugars daily once home and to f/u with PCP.  Patient verbalized understanding.    Thanks  Adah Perl, RN, BC-ADM Inpatient Diabetes Coordinator Pager [phone]

## 2019-06-05 NOTE — Discharge Instructions (Signed)
°Carbohydrate Counting For People With Diabetes ° °Why Is Carbohydrate Counting Important? °• Counting carbohydrate servings may help you control your blood glucose level so that you feel better.  °• The balance between the carbohydrates you eat and insulin determines what your blood glucose level will be after eating.  °• Carbohydrate counting can also help you plan your meals. °Which Foods Have Carbohydrates? °Foods with carbohydrates include: °• Breads, crackers, and cereals  °• Pasta, rice, and grains  °• Starchy vegetables, such as potatoes, corn, and peas  °• Beans and legumes  °• Milk, soy milk, and yogurt  °• Fruits and fruit juices  °• Sweets, such as cakes, cookies, ice cream, jam, and jelly °Carbohydrate Servings °In diabetes meal planning, 1 serving of a food with carbohydrate has about 15 grams of carbohydrate: °• Check serving sizes with measuring cups and spoons or a food scale.  °• Read the Nutrition Facts on food labels to find out how many grams of carbohydrate are in foods you eat. °The food lists in this handout show portions that have about 15 grams of carbohydrate. ° °Tips °Meal Planning Tips °• An Eating Plan tells you how many carbohydrate servings to eat at your meals and snacks. For many adults, eating 3 to 5 servings of carbohydrate foods at each meal and 1 or 2 carbohydrate servings for each snack works well.  °• In a healthy daily Eating Plan, most carbohydrates come from:  °• At least 6 servings of fruits and nonstarchy vegetables  °• At least 6 servings of grains, beans, and starchy vegetables, with at least 3 servings from whole grains  °• At least 2 servings of milk or milk products °• Check your blood glucose level regularly. It can tell you if you need to adjust when you eat carbohydrates.  °• Eating foods that have fiber, such as whole grains, and having very few salty foods is good for your health.  °• Eat 4 to 6 ounces of meat or other protein foods (such as soybean burgers)  each day. Choose low-fat sources of protein, such as lean beef, lean pork, chicken, fish, low-fat cheese, or vegetarian foods such as soy.  °• Eat some healthy fats, such as olive oil, canola oil, and nuts.  °• Eat very little saturated fats. These unhealthy fats are found in butter, cream, and high-fat meats, such as bacon and sausage.  °• Eat very little or no trans fats. These unhealthy fats are found in all foods that list “partially hydrogenated oil” as an ingredient.  °Label Reading Tips °The Nutrition Facts panel on a label lists the grams of total carbohydrate in 1 standard serving. The label's standard serving may be larger or smaller than 1 carbohydrate serving. °To figure out how many carbohydrate servings are in the food: °• First, look at the label's standard serving size.  °• Check the grams of total carbohydrate. This is the amount of carbohydrate in 1 standard serving.  °• Divide the grams of total carbohydrate by 15. This number equals the number of carbohydrate servings in 1 standard serving. Remember: 1 carbohydrate serving is 15 grams of carbohydrate.  °• Note: You may ignore the grams of sugars on the Nutrition Facts panel because they are included in the grams of total carbohydrate. ° °Foods Recommended °1 serving = about 15 grams of carbohydrate °Starches °• 1 slice bread (1 ounce)  °• 1 tortilla (6-inch size)  °• ¼ large bagel (1 ounce)  °• 2 taco shells (5-inch   size)  °• ½ hamburger or hot dog bun (¾ ounce)  °• ¾ cup ready-to-eat unsweetened cereal  °• ½ cup cooked cereal  °• 1 cup broth-based soup  °• 4 to 6 small crackers  °• 1/3 cup pasta or rice (cooked)  °• ½ cup beans, peas, corn, sweet potatoes, winter squash, or mashed or boiled potatoes (cooked)  °• ¼ large baked potato (3 ounces)  °• ¾ ounce pretzels, potato chips, or tortilla chips  °• 3 cups popcorn (popped) °Fruit °• 1 small fresh fruit (¾ to 1 cup)  °• ½ cup canned or frozen fruit  °• 2 tablespoons dried fruit (blueberries,  cherries, cranberries, mixed fruit, raisins)  °• 17 small grapes (3 ounces)  °• 1 cup melon or berries  °• ½ cup unsweetened fruit juice °Milk °• 1 cup fat-free or reduced-fat milk  °• 1 cup soy milk  °• 2/3 cup (6 ounces) nonfat yogurt sweetened with sugar-free sweetener °Sweets and Desserts °• 2-inch square cake (unfrosted)  °• 2 small cookies (2/3 ounce)  °• ½ cup ice cream or frozen yogurt  °• ¼ cup sherbet or sorbet  °• 1 tablespoon syrup, jam, jelly, table sugar, or honey  °• 2 tablespoons light syrup °Other Foods °• Count 1 cup raw vegetables or ½ cup cooked nonstarchy vegetables as zero (0) carbohydrate servings or “free” foods. If you eat 3 or more servings at one meal, count them as 1 carbohydrate serving.  °• Foods that have less than 20 calories in each serving also may be counted as zero carbohydrate servings or “free” foods.  °• Count 1 cup of casserole or other mixed foods as 2 carbohydrate servings. ° °Carbohydrate Counting for People with Diabetes Sample 1-Day Menu  °Breakfast 1 extra-small banana (1 carbohydrate serving)  °1 cup low-fat or fat-free milk (1 carbohydrate serving)  °1 slice whole wheat bread (1 carbohydrate serving)  °1 teaspoon margarine  °Lunch 2 ounces turkey slices  °2 slices whole wheat bread (2 carbohydrate servings)  °2 lettuce leaves  °4 celery sticks  °4 carrot sticks  °1 medium apple (1 carbohydrate serving)  °1 cup low-fat or fat-free milk (1 carbohydrate serving)  °Afternoon Snack 2 tablespoons raisins (1 carbohydrate serving)  °3/4 ounce unsalted mini pretzels (1 carbohydrate serving)  °Evening Meal 3 ounces lean roast beef  °1/2 large baked potato (2 carbohydrate servings)  °1 tablespoon reduced-fat sour cream  °1/2 cup green beans  °1 tablespoon light salad dressing  °1 whole wheat dinner roll (1 carbohydrate serving)  °1 teaspoon margarine  °1 cup melon balls (1 carbohydrate serving)  °Evening Snack 2 tablespoons unsalted nuts  ° °Carbohydrate Counting for People with  Diabetes Vegan Sample 1-Day Menu  °Breakfast 1 cup cooked oatmeal (2 carbohydrate servings)  °½ cup blueberries (1 carbohydrate serving)  °2 tablespoons flaxseeds  °1 cup soymilk fortified with calcium and vitamin D  °1 cup coffee  °Lunch 2 slices whole wheat bread (2 carbohydrate servings)  °½ cup baked tofu  °¼ cup lettuce  °2 slices tomato  °2 slices avocado  °½ cup baby carrots  °1 orange (1 carbohydrate serving)  °1 cup soymilk fortified with calcium and vitamin D   °Evening Meal Burrito made with: 1 6-inch corn tortilla (1 carbohydrate serving)  °1 cup refried vegetarian beans (1 carbohydrate serving)  °¼ cup chopped tomatoes  °¼ cup lettuce  °¼ cup salsa  °1/3 cup brown rice (1 carbohydrate serving)  °1 tablespoon olive oil for rice  °½   cup zucchini   °Evening Snack 6 small whole grain crackers (1 carbohydrate serving)  °2 apricots (½ carbohydrate serving)  °¼ cup unsalted peanuts (½ carbohydrate serving)   ° ° °Carbohydrate Counting for People with Diabetes Vegetarian (Lacto-Ovo) Sample 1-Day Menu  °Breakfast 1 cup cooked oatmeal (2 carbohydrate servings)  °½ cup blueberries (1 carbohydrate serving)  °2 tablespoons flaxseeds  °1 egg  °1 cup 1% milk (1 carbohydrate serving)  °1 cup coffee  °Lunch 2 slices whole wheat bread (2 carbohydrate servings)  °2 ounces low-fat cheese  °¼ cup lettuce  °2 slices tomato  °2 slices avocado  °½ cup baby carrots  °1 orange (1 carbohydrate serving)  °1 cup unsweetened tea  °Evening Meal Burrito made with: 1 6-inch corn tortilla (1 carbohydrate serving)  °½ cup refried vegetarian beans (1 carbohydrate serving)  °¼ cup tomatoes  °¼ cup lettuce  °¼ cup salsa  °1/3 cup brown rice (1 carbohydrate serving)  °1 tablespoon olive oil for rice  °½ cup zucchini  °1 cup 1% milk (1 carbohydrate serving)  °Evening Snack 6 small whole grain crackers (1 carbohydrate serving)  °2 apricots (½ carbohydrate serving)  °¼ cup unsalted peanuts (½ carbohydrate serving)   ° °Copyright 2020 © Academy  of [employer] and Dietetics. All rights reserved. ° °Using Nutrition Labels: Carbohydrate ° °• Serving Size  °• Look at the serving size. All the information on the label is based on this portion. °• Servings Per Container  °• The number of servings contained in the package. °• Guidelines for Carbohydrate  °• Look at the total grams of carbohydrate in the serving size.  °• 1 carbohydrate choice = 15 grams of carbohydrate. °Range of Carbohydrate Grams Per Choice  °Carbohydrate Grams/Choice Carbohydrate Choices  °6-10 ½  °11-20 1  °21-25 1½  °26-35 2  °36-40 2½  °41-50 3  °51-55 3½  °56-65 4  °66-70 4½  °71-80 5  ° ° °Copyright 2020 © Academy of [employer] and Dietetics. All rights reserved. ° °

## 2019-06-06 LAB — GLUCOSE, CAPILLARY
Glucose-Capillary: 183 mg/dL — ABNORMAL HIGH (ref 70–99)
Glucose-Capillary: 187 mg/dL — ABNORMAL HIGH (ref 70–99)
Glucose-Capillary: 211 mg/dL — ABNORMAL HIGH (ref 70–99)
Glucose-Capillary: 211 mg/dL — ABNORMAL HIGH (ref 70–99)

## 2019-06-06 LAB — CBC WITH DIFFERENTIAL/PLATELET
Abs Immature Granulocytes: 0.34 10*3/uL — ABNORMAL HIGH (ref 0.00–0.07)
Basophils Absolute: 0 10*3/uL (ref 0.0–0.1)
Basophils Relative: 0 %
Eosinophils Absolute: 0 10*3/uL (ref 0.0–0.5)
Eosinophils Relative: 0 %
HCT: 44.1 % (ref 39.0–52.0)
Hemoglobin: 14.9 g/dL (ref 13.0–17.0)
Immature Granulocytes: 3 %
Lymphocytes Relative: 4 %
Lymphs Abs: 0.5 10*3/uL — ABNORMAL LOW (ref 0.7–4.0)
MCH: 31.6 pg (ref 26.0–34.0)
MCHC: 33.8 g/dL (ref 30.0–36.0)
MCV: 93.4 fL (ref 80.0–100.0)
Monocytes Absolute: 0.6 10*3/uL (ref 0.1–1.0)
Monocytes Relative: 5 %
Neutro Abs: 11.3 10*3/uL — ABNORMAL HIGH (ref 1.7–7.7)
Neutrophils Relative %: 88 %
Platelets: 254 10*3/uL (ref 150–400)
RBC: 4.72 MIL/uL (ref 4.22–5.81)
RDW: 12 % (ref 11.5–15.5)
WBC: 12.7 10*3/uL — ABNORMAL HIGH (ref 4.0–10.5)
nRBC: 0 % (ref 0.0–0.2)

## 2019-06-06 LAB — COMPREHENSIVE METABOLIC PANEL
ALT: 76 U/L — ABNORMAL HIGH (ref 0–44)
AST: 25 U/L (ref 15–41)
Albumin: 2.8 g/dL — ABNORMAL LOW (ref 3.5–5.0)
Alkaline Phosphatase: 40 U/L (ref 38–126)
Anion gap: 10 (ref 5–15)
BUN: 42 mg/dL — ABNORMAL HIGH (ref 8–23)
CO2: 24 mmol/L (ref 22–32)
Calcium: 8.6 mg/dL — ABNORMAL LOW (ref 8.9–10.3)
Chloride: 102 mmol/L (ref 98–111)
Creatinine, Ser: 1.03 mg/dL (ref 0.61–1.24)
GFR calc Af Amer: >60 mL/min (ref >60)
GFR calc non Af Amer: >60 mL/min (ref >60)
Glucose, Bld: 113 mg/dL — ABNORMAL HIGH (ref 70–99)
Potassium: 4.7 mmol/L (ref 3.5–5.1)
Sodium: 136 mmol/L (ref 135–145)
Total Bilirubin: 0.7 mg/dL (ref 0.3–1.2)
Total Protein: 5.8 g/dL — ABNORMAL LOW (ref 6.5–8.1)

## 2019-06-06 LAB — MAGNESIUM: Magnesium: 2.2 mg/dL (ref 1.7–2.4)

## 2019-06-06 LAB — D-DIMER, QUANTITATIVE: D-Dimer, Quant: 0.76 ug/mL-FEU — ABNORMAL HIGH (ref 0.00–0.50)

## 2019-06-06 LAB — C-REACTIVE PROTEIN: CRP: <0.8 mg/dL (ref <1.0)

## 2019-06-06 NOTE — Progress Notes (Signed)
ICU Follow-up Progress: CN informed of high O2 delivery.  Went to assess pt.  He is currently on NRB with HFNC.  O2 sat mid to low 90's.  Denies SOB.  Lungs sound diminished.  Reinforced IS and flutter valve use.

## 2019-06-06 NOTE — Progress Notes (Signed)
PROGRESS NOTE    [name]  [id number] DOB: 03/19/1941 DOA: 06/02/2019 PCP: Ria Bush, MD   Brief Narrative:  78yo WM PMHx  CAD s/p CABG 1990, PVD, prediabetes, OSA not on CPAP, HTN and HLD who was admitted at [facility] 10/15 with covid-19 pneumonia causing hypoxic respiratory failure after initially testing positive in presurgical screening 10/5. Remdesivir and steroids were providded until he was ultimately transferred to [facility] this AM and admitted by Dr. Shanon Brow. Feels about the same as he has, but oxygen requirement seems to be increasing. He's hungry, feels short of breath with any exertion. No chest pain, no leg swelling.   Subjective: No acute issues or events overnight.admits to generally feeling much improved from previous but not yet back to baseline.  Otherwise denies chest pain, shortness of breath, nausea, vomiting, diarrhea, constipation, headache, fevers, chills.  Assessment & Plan: Principal Problem:   Pneumonia due to COVID-19 virus Active Problems:   Essential hypertension   Carotid stenosis   PVD (peripheral vascular disease) with claudication (HCC)   DVT, HX OF   Cervical stenosis of spinal canal   CAD (coronary artery disease), native coronary artery   Acute respiratory failure due to COVID-19 [facility])   Anxiety   Covid pneumonia/acute respiratory failure with hypoxia -10/18 transfuse 1 unit Covid convalescent plasma -10/20 decrease Solu-Medrol 40 mg BID -will likely need prolonged steroid taper - Remdesivir per pharmacy protocol -now completed - Weaned to HFNC 6L/min  -Continue to prone, use incentive spirometry and flutter as tolerated, out of bed as tolerated SpO2: 91 % O2 Flow Rate (L/min): (S) 6 L/min FiO2 (%): 70 % Recent Labs    06/04/19 0540 06/05/19 0500 06/05/19 0602 06/06/19 0535  DDIMER 0.84* 0.86*  --  0.76*  CRP 1.3*  --  0.9 <0.8   Prediabetes with hyperglycemia in the setting of steroids as above -4/21 hemoglobin A1c= 6.4  -Continue Lantus, sliding scale, hypoglycemic protocol -Diabetic coordinator following, nutritionist following  HLD -Lipid panel unremarkable other than minimally low HDL -Continue Zetia 10 mg daily  Essential HTN -ASA 81 mg daily -Imdur 30 mg daily -Continue metoprolol 12.5 mg BID, lisinopril 2.5 mg daily  CAD s/p CABG -Continue ACE, beta-blocker, aspirin as above  PVD/carotid stenosis -See HTN, prediabetes, HLD  Anxiety/agitation -Steroids may be causing/exacerbating patient's anxiety.  Have decreased steroid dose, but explained the patient needs minimum 10 days of steroids for Covid infection -10/20 Ativan IV 0.5 mg TID.  If takes edge off agitation/anxiety convert to p.o.  DVT prophylaxis: Lovenox Code Status: Full Family Communication: 10/22 spoke with RN Arrie Aran (daughter), prefer to call wife as primary from now on Disposition Plan: Ending clinical improvement, hopeful disposition home per patient/family  Antimicrobials: Anti-infectives (From admission, onward)   Start     Stop   06/02/19 1600  remdesivir 100 mg in sodium chloride 0.9 % 250 mL IVPB     06/04/19 1559     Continuous Infusions: . sodium chloride    . sodium chloride Stopped (06/03/19 1842)   Objective: Vitals:   06/06/19 0732 06/06/19 0853 06/06/19 1000 06/06/19 1300  BP:  107/61    Pulse:  75 72 73  Resp:  20    Temp:  98.3 F (36.8 C)    TempSrc:  Oral    SpO2: 96% 91% (!) 89% 91%  Weight:      Height:        Intake/Output Summary (Last 24 hours) at 06/06/2019 1533 Last data filed at 06/06/2019  1236 Gross per 24 hour  Intake 480 ml  Output 975 ml  Net -495 ml   Filed Weights   06/02/19 0614  Weight: 92 kg   Physical Exam:  General: A/O x4, no acute distress, sitting comfortably at bedside chair Eyes: negative scleral hemorrhage, negative anisocoria, negative icterus ENT: Negative Runny nose, negative gingival bleeding, Neck:  Negative scars, masses, torticollis,  lymphadenopathy, JVD Lungs: Tachypneic, clear to auscultation bilaterally without wheezes or crackles Cardiovascular: Tachycardic without murmur gallop or rub normal S1 and S2 Abdomen: negative abdominal pain, nondistended, positive soft, bowel sounds, no rebound, no ascites, no appreciable mass Extremities: No significant cyanosis, clubbing, or edema bilateral lower extremities Skin: Negative rashes, lesions, ulcers Central nervous system:  Cranial nerves II through XII intact, tongue/uvula midline, all extremities muscle strength 5/5, sensation intact throughout,negative dysarthria, negative expressive aphasia, negative receptive aphasia.  Data Reviewed: Care during the described time interval was provided by me .  I have reviewed this patient's available data, including medical history, events of note, physical examination, and all test results as part of my evaluation.   CBC: Recent Labs  Lab 06/02/19 0634 06/03/19 0545 06/04/19 0540 06/05/19 0500 06/06/19 0535  WBC 9.6 9.4 11.7* 11.8* 12.7*  NEUTROABS 8.7* 8.4* 10.6* 10.6* 11.3*  HGB 13.4 12.8* 13.7 14.6 14.9  HCT 38.7* 37.8* 40.2 43.5 44.1  MCV 93.5 93.6 93.5 94.2 93.4  PLT 186 201 217 227 0000000   Basic Metabolic Panel: Recent Labs  Lab 06/02/19 0634 06/03/19 0545 06/04/19 0540 06/05/19 0500 06/06/19 0535  NA 138 138 141 136 136  K 4.4 4.1 4.6 4.7 4.7  CL 105 102 104 102 102  CO2 20* 25 26 22 24   GLUCOSE 175* 267* 117* 107* 113*  BUN 33* 47* 39* 44* 42*  CREATININE 1.08 1.20 1.05 1.13 1.03  CALCIUM 8.2* 8.4* 8.5* 8.3* 8.6*  MG  --  2.2 2.3 2.4 2.2  PHOS  --  4.6  --   --   --    GFR: Estimated Creatinine Clearance: 64.9 mL/min (by C-G formula based on SCr of 1.03 mg/dL). Liver Function Tests: Recent Labs  Lab 06/02/19 0634 06/03/19 0545 06/04/19 0540 06/05/19 0500 06/06/19 0535  AST 44* 48* 44* 37 25  ALT 63* 100* 110* 100* 76*  ALKPHOS 43 40 40 40 40  BILITOT 0.4 1.1 0.7 1.0 0.7  PROT 6.0* 5.9* 5.7* 5.7*  5.8*  ALBUMIN 2.9* 3.0* 2.8* 2.8* 2.8*   CBG: Recent Labs  Lab 06/05/19 1232 06/05/19 1620 06/05/19 2129 06/06/19 0856 06/06/19 1151  GLUCAP 255* 183* 275* 187* 211*   Lipid Profile: Recent Labs    06/04/19 0540  CHOL 109  HDL 33*  LDLCALC 62  TRIG 68  CHOLHDL 3.3   Urine analysis:    Component Value Date/Time   COLORURINE yellow 07/02/2008 0832   APPEARANCEUR Clear 07/02/2008 0832   LABSPEC 1.015 07/02/2008 0832   PHURINE 5.5 07/02/2008 0832   HGBUR negative 07/02/2008 0832   BILIRUBINUR negative 07/02/2008 0832   UROBILINOGEN 0.2 07/02/2008 0832   NITRITE negative 07/02/2008 0832   Recent Results (from the past 240 hour(s))  Culture, blood (routine x 2)     Status: None   Collection Time: 05/30/19  9:44 AM   Specimen: BLOOD  Result Value Ref Range Status   Specimen Description BLOOD LEFT ARM  Final   Special Requests   Final    BOTTLES DRAWN AEROBIC AND ANAEROBIC Blood Culture adequate volume   Culture  Final    NO GROWTH 5 DAYS Performed at [facility], [address] [place], [place] [address]    Report Status 06/04/2019 FINAL  Final  Culture, blood (routine x 2)     Status: None   Collection Time: 05/30/19  9:44 AM   Specimen: BLOOD  Result Value Ref Range Status   Specimen Description BLOOD RIGHT HAND  Final   Special Requests   Final    BOTTLES DRAWN AEROBIC AND ANAEROBIC Blood Culture adequate volume   Culture   Final    NO GROWTH 5 DAYS Performed at [facility], [address], [place], [place] [address]    Report Status 06/04/2019 FINAL  Final  SARS CORONAVIRUS 2 (TAT 6-24 HRS) Nasopharyngeal Nasopharyngeal Swab     Status: Abnormal   Collection Time: 05/31/19  7:44 AM   Specimen: Nasopharyngeal Swab  Result Value Ref Range Status   SARS Coronavirus 2 POSITIVE (A) NEGATIVE Final    Comment: CRITICAL RESULT CALLED TO, READ BACK BY AND VERIFIED WITH: S.SUMMERS RN 1640 05/31/2019 MCCORMICK K (NOTE) SARS-CoV-2 target  nucleic acids are DETECTED. The SARS-CoV-2 RNA is generally detectable in upper and lower respiratory specimens during the acute phase of infection. Positive results are indicative of active infection with SARS-CoV-2. Clinical  correlation with patient history and other diagnostic information is necessary to determine patient infection status. Positive results do  not rule out bacterial infection or co-infection with other viruses. The expected result is Negative. Fact Sheet for Patients: SugarRoll.be Fact Sheet for Healthcare Providers: https://www.woods-mathews.com/ This test is not yet approved or cleared by the [place] FDA and  has been authorized for detection and/or diagnosis of SARS-CoV-2 by FDA under an Emergency Use Authorization (EUA). This EUA will remain  in effect (meaning this test can  be used) for the duration of the COVID-19 declaration under Section 564(b)(1) of the Act, 21 U.S.C. section 360bbb-3(b)(1), unless the authorization is terminated or revoked sooner. Performed at [facility], [place] [address] [place], [place] [address]          Radiology Studies: No results found.      Scheduled Meds: . sodium chloride   Intravenous Once  . aspirin EC  81 mg Oral Daily  . Chlorhexidine Gluconate Cloth  6 each Topical Daily  . enoxaparin (LOVENOX) injection  40 mg Subcutaneous Q12H  . ezetimibe  10 mg Oral QHS  . fesoterodine  4 mg Oral Daily  . gabapentin  300 mg Oral TID  . insulin aspart  0-20 Units Subcutaneous TID WC  . insulin aspart  0-5 Units Subcutaneous QHS  . insulin glargine  8 Units Subcutaneous Daily  . isosorbide mononitrate  30 mg Oral Daily  . lisinopril  2.5 mg Oral Daily  . mouth rinse  15 mL Mouth Rinse BID  . methylPREDNISolone (SOLU-MEDROL) injection  40 mg Intravenous Q8H  . metoprolol tartrate  12.5 mg Oral BID  . simvastatin  40 mg Oral QHS  . sodium chloride flush  3 mL  Intravenous Q12H  . vitamin C  500 mg Oral Daily  . zinc sulfate  220 mg Oral Daily   Continuous Infusions: . sodium chloride    . sodium chloride Stopped (06/03/19 1842)     LOS: 4 days    Little Ishikawa, DO [facility] Pager [phone]  If 7PM-7AM, please contact night-coverage [email or web address] Password [facility] 06/06/2019, 3:33 PM

## 2019-06-07 LAB — GLUCOSE, CAPILLARY
Glucose-Capillary: 254 mg/dL — ABNORMAL HIGH (ref 70–99)
Glucose-Capillary: 120 mg/dL — ABNORMAL HIGH (ref 70–99)
Glucose-Capillary: 183 mg/dL — ABNORMAL HIGH (ref 70–99)
Glucose-Capillary: 240 mg/dL — ABNORMAL HIGH (ref 70–99)

## 2019-06-07 LAB — C-REACTIVE PROTEIN: CRP: 0.8 mg/dL (ref <1.0)

## 2019-06-07 LAB — MAGNESIUM: Magnesium: 2.4 mg/dL (ref 1.7–2.4)

## 2019-06-07 NOTE — Progress Notes (Signed)
Spoke with [facility] and provided update.

## 2019-06-07 NOTE — Progress Notes (Signed)
PROGRESS NOTE    RENNY KEDROWSKI  [id number] DOB: 12-05-1940 DOA: 06/02/2019 PCP: Ria Bush, MD   Brief Narrative:  78yo WM PMHx  CAD s/p CABG 1990, PVD, prediabetes, OSA not on CPAP, HTN and HLD who was admitted at [facility] 10/15 with covid-19 pneumonia causing hypoxic respiratory failure after initially testing positive in presurgical screening 10/5. Remdesivir and steroids were providded until he was ultimately transferred to [facility] this AM and admitted by Dr. Shanon Brow. Feels about the same as he has, but oxygen requirement seems to be increasing. He's hungry, feels short of breath with any exertion. No chest pain, no leg swelling.   Subjective: Continues to desat overnight due to history of OSA unable to offer NIPPV. No acute issues or events overnight.admits to generally feeling much improved from previous but not yet back to baseline.  Otherwise denies chest pain, shortness of breath, nausea, vomiting, diarrhea, constipation, headache, fevers, chills.  Assessment & Plan: Principal Problem:   Pneumonia due to COVID-19 virus Active Problems:   Essential hypertension   Carotid stenosis   PVD (peripheral vascular disease) with claudication (HCC)   DVT, HX OF   Cervical stenosis of spinal canal   CAD (coronary artery disease), native coronary artery   Acute respiratory failure due to COVID-19 [facility])   Anxiety   Covid pneumonia/acute respiratory failure with hypoxia - 10/18 transfuse 1 unit Covid convalescent plasma - 10/20 decrease Solu-Medrol 40 mg BID -will likely need prolonged steroid taper - Remdesivir per pharmacy protocol -now completed - Weaned to HFNC 6L/min this morning - continues to have episodes of desaturation overnight likely in the setting of poorly controlled sleep apnea and mouth breathing around nasal cannula - will attempt heated high flow per discussion with PCCM - Continue to prone, use incentive spirometry and flutter as tolerated, out of bed as tolerated  SpO2: 92 % O2 Flow Rate (L/min): 6 L/min FiO2 (%): 70 % Recent Labs    06/05/19 0500 06/05/19 0602 06/06/19 0535 06/07/19 0136  DDIMER 0.86*  --  0.76*  --   CRP  --  0.9 <0.8 0.8   Prediabetes with hyperglycemia in the setting of steroids as above - 4/21 hemoglobin A1c= 6.4 - Continue Lantus, sliding scale, hypoglycemic protocol - Diabetic coordinator following, nutritionist following  HLD - Lipid panel unremarkable other than minimally low HDL - Continue Zetia 10 mg daily  Essential HTN - ASA 81 mg daily - Imdur 30 mg daily - Continue metoprolol 12.5 mg BID, lisinopril 2.5 mg daily  CAD s/p CABG - Continue ACE, beta-blocker, aspirin as above  PVD/carotid stenosis - See HTN, prediabetes, HLD  Anxiety/agitation - Steroids may be causing/exacerbating patient's anxiety.  Have decreased steroid dose, but explained the patient needs minimum 10 days of steroids for Covid infection - 10/20 Ativan IV 0.5 mg TID. If takes edge off agitation/anxiety convert to PO.  DVT prophylaxis: Lovenox Code Status: Full Family Communication: Wife updated - family prefers we call wife as primary from now on (Daughter is nurse and available if necessary) Disposition Plan: Ending clinical improvement, hopeful disposition home per patient/family  Antimicrobials: Anti-infectives (From admission, onward)   Start     Stop   06/02/19 1600  remdesivir 100 mg in sodium chloride 0.9 % 250 mL IVPB     06/04/19 1559     Continuous Infusions: . sodium chloride    . sodium chloride Stopped (06/03/19 1842)   Objective: Vitals:   06/07/19 0400 06/07/19 0404 06/07/19 0813 06/07/19 1237  BP: (!) 156/68 137/60 138/69 (!) 109/46  Pulse: 66 63 61 66  Resp: 20 20 16 20   Temp: 97.7 F (36.5 C) 97.8 F (36.6 C) 97.7 F (36.5 C) (!) 97.3 F (36.3 C)  TempSrc: Oral Oral Oral Axillary  SpO2: 93% 95% 93% 92%  Weight:      Height:        Intake/Output Summary (Last 24 hours) at 06/07/2019 1426  Last data filed at 06/07/2019 [id number] Gross per 24 hour  Intake 600 ml  Output 1200 ml  Net -600 ml   Filed Weights   06/02/19 0614  Weight: 92 kg   Physical Exam:  General:  Pleasantly resting in bed, No acute distress.  Tolerating high flow nasal cannula without issue. HEENT:  Normocephalic atraumatic.  Sclerae nonicteric, noninjected.  Extraocular movements intact bilaterally. Neck:  Without mass or deformity.  Trachea is midline. Lungs:  Clear to auscultate bilaterally without rhonchi, wheeze, or rales. Heart:  Regular rate and rhythm.  Without murmurs, rubs, or gallops. Abdomen:  Soft, nontender, nondistended.  Without guarding or rebound. Extremities: Without cyanosis, clubbing, edema, or obvious deformity. Vascular:  Dorsalis pedis and posterior tibial pulses palpable bilaterally. Skin:  Warm and dry, no erythema, no ulcerations.   Data Reviewed: Care during the described time interval was provided by me .  I have reviewed this patient's available data, including medical history, events of note, physical examination, and all test results as part of my evaluation.   CBC: Recent Labs  Lab 06/02/19 0634 06/03/19 0545 06/04/19 0540 06/05/19 0500 06/06/19 0535  WBC 9.6 9.4 11.7* 11.8* 12.7*  NEUTROABS 8.7* 8.4* 10.6* 10.6* 11.3*  HGB 13.4 12.8* 13.7 14.6 14.9  HCT 38.7* 37.8* 40.2 43.5 44.1  MCV 93.5 93.6 93.5 94.2 93.4  PLT 186 201 217 227 0000000   Basic Metabolic Panel: Recent Labs  Lab 06/02/19 0634 06/03/19 0545 06/04/19 0540 06/05/19 0500 06/06/19 0535 06/07/19 0136  NA 138 138 141 136 136  --   K 4.4 4.1 4.6 4.7 4.7  --   CL 105 102 104 102 102  --   CO2 20* 25 26 22 24   --   GLUCOSE 175* 267* 117* 107* 113*  --   BUN 33* 47* 39* 44* 42*  --   CREATININE 1.08 1.20 1.05 1.13 1.03  --   CALCIUM 8.2* 8.4* 8.5* 8.3* 8.6*  --   MG  --  2.2 2.3 2.4 2.2 2.4  PHOS  --  4.6  --   --   --   --    GFR: Estimated Creatinine Clearance: 64.9 mL/min (by C-G formula  based on SCr of 1.03 mg/dL).  Liver Function Tests: Recent Labs  Lab 06/02/19 0634 06/03/19 0545 06/04/19 0540 06/05/19 0500 06/06/19 0535  AST 44* 48* 44* 37 25  ALT 63* 100* 110* 100* 76*  ALKPHOS 43 40 40 40 40  BILITOT 0.4 1.1 0.7 1.0 0.7  PROT 6.0* 5.9* 5.7* 5.7* 5.8*  ALBUMIN 2.9* 3.0* 2.8* 2.8* 2.8*   CBG: Recent Labs  Lab 06/06/19 1151 06/06/19 1620 06/06/19 2103 06/07/19 0811 06/07/19 1147  GLUCAP 211* 211* 254* 120* 183*   Urine analysis:    Component Value Date/Time   COLORURINE yellow 07/02/2008 0832   APPEARANCEUR Clear 07/02/2008 0832   LABSPEC 1.015 07/02/2008 0832   PHURINE 5.5 07/02/2008 0832   HGBUR negative 07/02/2008 0832   BILIRUBINUR negative 07/02/2008 0832   UROBILINOGEN 0.2 07/02/2008 0832   NITRITE negative 07/02/2008  0832   Recent Results (from the past 240 hour(s))  Culture, blood (routine x 2)     Status: None   Collection Time: 05/30/19  9:44 AM   Specimen: BLOOD  Result Value Ref Range Status   Specimen Description BLOOD LEFT ARM  Final   Special Requests   Final    BOTTLES DRAWN AEROBIC AND ANAEROBIC Blood Culture adequate volume   Culture   Final    NO GROWTH 5 DAYS Performed at [facility], [address], [place], [place] [address]    Report Status 06/04/2019 FINAL  Final  Culture, blood (routine x 2)     Status: None   Collection Time: 05/30/19  9:44 AM   Specimen: BLOOD  Result Value Ref Range Status   Specimen Description BLOOD RIGHT HAND  Final   Special Requests   Final    BOTTLES DRAWN AEROBIC AND ANAEROBIC Blood Culture adequate volume   Culture   Final    NO GROWTH 5 DAYS Performed at [facility], [address] [place], [place] [address]    Report Status 06/04/2019 FINAL  Final  SARS CORONAVIRUS 2 (TAT 6-24 HRS) Nasopharyngeal Nasopharyngeal Swab     Status: Abnormal   Collection Time: 05/31/19  7:44 AM   Specimen: Nasopharyngeal Swab  Result Value Ref Range Status   SARS Coronavirus  2 POSITIVE (A) NEGATIVE Final    Comment: CRITICAL RESULT CALLED TO, READ BACK BY AND VERIFIED WITH: S.SUMMERS RN 1640 05/31/2019 MCCORMICK K (NOTE) SARS-CoV-2 target nucleic acids are DETECTED. The SARS-CoV-2 RNA is generally detectable in upper and lower respiratory specimens during the acute phase of infection. Positive results are indicative of active infection with SARS-CoV-2. Clinical  correlation with patient history and other diagnostic information is necessary to determine patient infection status. Positive results do  not rule out bacterial infection or co-infection with other viruses. The expected result is Negative. Fact Sheet for Patients: SugarRoll.be Fact Sheet for Healthcare Providers: https://www.woods-mathews.com/ This test is not yet approved or cleared by the [place] FDA and  has been authorized for detection and/or diagnosis of SARS-CoV-2 by FDA under an Emergency Use Authorization (EUA). This EUA will remain  in effect (meaning this test can  be used) for the duration of the COVID-19 declaration under Section 564(b)(1) of the Act, 21 U.S.C. section 360bbb-3(b)(1), unless the authorization is terminated or revoked sooner. Performed at [facility], [place] [address] [place], [place] [address]      Scheduled Meds: . sodium chloride   Intravenous Once  . aspirin EC  81 mg Oral Daily  . Chlorhexidine Gluconate Cloth  6 each Topical Daily  . enoxaparin (LOVENOX) injection  40 mg Subcutaneous Q12H  . ezetimibe  10 mg Oral QHS  . fesoterodine  4 mg Oral Daily  . gabapentin  300 mg Oral TID  . insulin aspart  0-20 Units Subcutaneous TID WC  . insulin aspart  0-5 Units Subcutaneous QHS  . insulin glargine  8 Units Subcutaneous Daily  . isosorbide mononitrate  30 mg Oral Daily  . lisinopril  2.5 mg Oral Daily  . mouth rinse  15 mL Mouth Rinse BID  . methylPREDNISolone (SOLU-MEDROL) injection  40 mg Intravenous  Q8H  . metoprolol tartrate  12.5 mg Oral BID  . simvastatin  40 mg Oral QHS  . sodium chloride flush  3 mL Intravenous Q12H  . vitamin C  500 mg Oral Daily  . zinc sulfate  220 mg  Oral Daily   Continuous Infusions: . sodium chloride    . sodium chloride Stopped (06/03/19 1842)    LOS: 5 days   Little Ishikawa, DO [facility] Pager [phone]  If 7PM-7AM, please contact night-coverage [email or web address] Password [email or web address] 06/07/2019, 2:26 PM

## 2019-06-07 NOTE — Progress Notes (Signed)
Spoke to patient's wife [name]. Updated on condition and plan of care. All questions answered.

## 2019-06-08 LAB — MAGNESIUM: Magnesium: 2.4 mg/dL (ref 1.7–2.4)

## 2019-06-08 LAB — GLUCOSE, CAPILLARY
Glucose-Capillary: 158 mg/dL — ABNORMAL HIGH (ref 70–99)
Glucose-Capillary: 235 mg/dL — ABNORMAL HIGH (ref 70–99)
Glucose-Capillary: 191 mg/dL — ABNORMAL HIGH (ref 70–99)
Glucose-Capillary: 324 mg/dL — ABNORMAL HIGH (ref 70–99)

## 2019-06-08 LAB — C-REACTIVE PROTEIN: CRP: <0.8 mg/dL (ref <1.0)

## 2019-06-08 MED ORDER — DIPHENHYDRAMINE HCL 25 MG PO CAPS
25.0000 mg | ORAL_CAPSULE | Freq: Every evening | ORAL | Status: DC | PRN
  Administered 2019-06-08: 25 mg via ORAL
  Filled 2019-06-08: qty 1

## 2019-06-08 NOTE — Progress Notes (Addendum)
Called patient's wife [name], updated on condition and plan of care  15:00 Paged MD and CM regarding patient's O2 needs with ambulation

## 2019-06-08 NOTE — Progress Notes (Signed)
PROGRESS NOTE    [name]  [id number] DOB: 1940/11/02 DOA: 06/02/2019 PCP: Ria Bush, MD   Brief Narrative:  78yo WM PMHx  CAD s/p CABG 1990, PVD, prediabetes, OSA not on CPAP, HTN and HLD who was admitted at [facility] 10/15 with covid-19 pneumonia causing hypoxic respiratory failure after initially testing positive in presurgical screening 10/5. Remdesivir and steroids were providded until he was ultimately transferred to [facility] this AM and admitted by Dr. Shanon Brow. Feels about the same as he has, but oxygen requirement seems to be increasing. He's hungry, feels short of breath with any exertion. No chest pain, no leg swelling.   Subjective: No acute issues or events overnight, patient no longer desaturating overnight despite sleep apnea.  Patient states she feels quite well despite being on high flow nasal cannula at rest, patient does indicate marked dyspnea with exertion even minimal exertion. Otherwise denies chest pain, shortness of breath, nausea, vomiting, diarrhea, constipation, headache, fevers, chills.  Assessment & Plan: Principal Problem:   Pneumonia due to COVID-19 virus Active Problems:   Essential hypertension   Carotid stenosis   PVD (peripheral vascular disease) with claudication (HCC)   DVT, HX OF   Cervical stenosis of spinal canal   CAD (coronary artery disease), native coronary artery   Acute respiratory failure due to COVID-19 [facility])   Anxiety   Covid pneumonia/acute respiratory failure with hypoxia, improving - 10/18 transfuse 1 unit Covid convalescent plasma - 10/20 decrease Solu-Medrol 40 mg BID -will likely need prolonged steroid taper - Remdesivir per pharmacy protocol -now completed - Weaned off high flow nasal cannula this morning to room air at rest, patient maintains saturations in the low 90 to high 80s.  Currently reasonable to maintain off oxygen with saturations greater than 88%.  Ambulatory oxygen screening planned for later today Recent  Labs    06/06/19 0535 06/07/19 0136 06/08/19 0041  DDIMER 0.76*  --   --   CRP <0.8 0.8 <0.8   Prediabetes with hyperglycemia in the setting of steroids as above - 4/21 hemoglobin A1c= 6.4 - Continue Lantus, sliding scale, hypoglycemic protocol - Diabetic coordinator following, nutritionist following  HLD - Lipid panel unremarkable other than minimally low HDL - Continue Zetia 10 mg daily  Essential HTN - ASA 81 mg daily - Imdur 30 mg daily - Continue metoprolol 12.5 mg BID, lisinopril 2.5 mg daily  CAD s/p CABG - Continue ACE, beta-blocker, aspirin as above  PVD/carotid stenosis - See HTN, prediabetes, HLD  Anxiety/agitation - Steroids may be causing/exacerbating patient's anxiety, but have now completed - 10/20 Ativan initiated but rarely used and subsequently discontinued  DVT prophylaxis: Lovenox Code Status: Full Family Communication: Wife updated - family prefers we call wife as primary from now on (Daughter is nurse and available if necessary) Disposition Plan: Pending clinical improvement, hopeful disposition home per patient/family in the next 48-72h pending h  Antimicrobials: Anti-infectives (From admission, onward)   Start     Stop   06/02/19 1600  remdesivir 100 mg in sodium chloride 0.9 % 250 mL IVPB     06/04/19 1559     Continuous Infusions: . sodium chloride    . sodium chloride Stopped (06/03/19 1842)   Objective: Vitals:   06/08/19 0600 06/08/19 0738 06/08/19 1118 06/08/19 1136  BP:  (!) 139/55  (!) 130/57  Pulse: 65 60 62 (!) 58  Resp:  20  19  Temp:  (!) 97.2 F (36.2 C)  97.7 F (36.5 C)  TempSrc:  Oral  Oral  SpO2: 92%  93% 93%  Weight:      Height:        Intake/Output Summary (Last 24 hours) at 06/08/2019 1453 Last data filed at 06/08/2019 0600 Gross per 24 hour  Intake 256 ml  Output 1700 ml  Net -1444 ml   Filed Weights   06/02/19 0614  Weight: 92 kg   Physical Exam:  General:  Pleasantly resting in bed, No acute  distress. Now off nasal cannula HEENT:  Normocephalic atraumatic.  Sclerae nonicteric, noninjected.  Extraocular movements intact bilaterally. Neck:  Without mass or deformity.  Trachea is midline. Lungs:  Clear to auscultate bilaterally without rhonchi, wheeze, or rales. Heart:  Regular rate and rhythm.  Without murmurs, rubs, or gallops. Abdomen:  Soft, nontender, nondistended.  Without guarding or rebound. Extremities: Without cyanosis, clubbing, edema, or obvious deformity. Vascular:  Dorsalis pedis and posterior tibial pulses palpable bilaterally. Skin:  Warm and dry, no erythema, no ulcerations.   Data Reviewed: Care during the described time interval was provided by me .  I have reviewed this patient's available data, including medical history, events of note, physical examination, and all test results as part of my evaluation.   CBC: Recent Labs  Lab 06/02/19 0634 06/03/19 0545 06/04/19 0540 06/05/19 0500 06/06/19 0535  WBC 9.6 9.4 11.7* 11.8* 12.7*  NEUTROABS 8.7* 8.4* 10.6* 10.6* 11.3*  HGB 13.4 12.8* 13.7 14.6 14.9  HCT 38.7* 37.8* 40.2 43.5 44.1  MCV 93.5 93.6 93.5 94.2 93.4  PLT 186 201 217 227 0000000   Basic Metabolic Panel: Recent Labs  Lab 06/02/19 0634  06/03/19 0545 06/04/19 0540 06/05/19 0500 06/06/19 0535 06/07/19 0136 06/08/19 0041  NA 138  --  138 141 136 136  --   --   K 4.4  --  4.1 4.6 4.7 4.7  --   --   CL 105  --  102 104 102 102  --   --   CO2 20*  --  25 26 22 24   --   --   GLUCOSE 175*  --  267* 117* 107* 113*  --   --   BUN 33*  --  47* 39* 44* 42*  --   --   CREATININE 1.08  --  1.20 1.05 1.13 1.03  --   --   CALCIUM 8.2*  --  8.4* 8.5* 8.3* 8.6*  --   --   MG  --    < > 2.2 2.3 2.4 2.2 2.4 2.4  PHOS  --   --  4.6  --   --   --   --   --    < > = values in this interval not displayed.   GFR: Estimated Creatinine Clearance: 64.9 mL/min (by C-G formula based on SCr of 1.03 mg/dL).  Liver Function Tests: Recent Labs  Lab 06/02/19 0634  06/03/19 0545 06/04/19 0540 06/05/19 0500 06/06/19 0535  AST 44* 48* 44* 37 25  ALT 63* 100* 110* 100* 76*  ALKPHOS 43 40 40 40 40  BILITOT 0.4 1.1 0.7 1.0 0.7  PROT 6.0* 5.9* 5.7* 5.7* 5.8*  ALBUMIN 2.9* 3.0* 2.8* 2.8* 2.8*   CBG: Recent Labs  Lab 06/07/19 0811 06/07/19 1147 06/07/19 2134 06/08/19 0743 06/08/19 1134  GLUCAP 120* 183* 240* 158* 235*   Urine analysis:    Component Value Date/Time   COLORURINE yellow 07/02/2008 0832   APPEARANCEUR Clear 07/02/2008 0832   LABSPEC 1.015 07/02/2008 [id number]  PHURINE 5.5 07/02/2008 0832   HGBUR negative 07/02/2008 0832   BILIRUBINUR negative 07/02/2008 0832   UROBILINOGEN 0.2 07/02/2008 0832   NITRITE negative 07/02/2008 0832   Recent Results (from the past 240 hour(s))  Culture, blood (routine x 2)     Status: None   Collection Time: 05/30/19  9:44 AM   Specimen: BLOOD  Result Value Ref Range Status   Specimen Description BLOOD LEFT ARM  Final   Special Requests   Final    BOTTLES DRAWN AEROBIC AND ANAEROBIC Blood Culture adequate volume   Culture   Final    NO GROWTH 5 DAYS Performed at [facility], [address], [place], [place] [address]    Report Status 06/04/2019 FINAL  Final  Culture, blood (routine x 2)     Status: None   Collection Time: 05/30/19  9:44 AM   Specimen: BLOOD  Result Value Ref Range Status   Specimen Description BLOOD RIGHT HAND  Final   Special Requests   Final    BOTTLES DRAWN AEROBIC AND ANAEROBIC Blood Culture adequate volume   Culture   Final    NO GROWTH 5 DAYS Performed at [facility], [address] [place], [place] [address]    Report Status 06/04/2019 FINAL  Final  SARS CORONAVIRUS 2 (TAT 6-24 HRS) Nasopharyngeal Nasopharyngeal Swab     Status: Abnormal   Collection Time: 05/31/19  7:44 AM   Specimen: Nasopharyngeal Swab  Result Value Ref Range Status   SARS Coronavirus 2 POSITIVE (A) NEGATIVE Final    Comment: CRITICAL RESULT CALLED TO, READ BACK BY AND  VERIFIED WITH: S.SUMMERS RN 1640 05/31/2019 MCCORMICK K (NOTE) SARS-CoV-2 target nucleic acids are DETECTED. The SARS-CoV-2 RNA is generally detectable in upper and lower respiratory specimens during the acute phase of infection. Positive results are indicative of active infection with SARS-CoV-2. Clinical  correlation with patient history and other diagnostic information is necessary to determine patient infection status. Positive results do  not rule out bacterial infection or co-infection with other viruses. The expected result is Negative. Fact Sheet for Patients: SugarRoll.be Fact Sheet for Healthcare Providers: https://www.woods-mathews.com/ This test is not yet approved or cleared by the [place] FDA and  has been authorized for detection and/or diagnosis of SARS-CoV-2 by FDA under an Emergency Use Authorization (EUA). This EUA will remain  in effect (meaning this test can  be used) for the duration of the COVID-19 declaration under Section 564(b)(1) of the Act, 21 U.S.C. section 360bbb-3(b)(1), unless the authorization is terminated or revoked sooner. Performed at [facility], [place] [address] [place], [place] [address]      Scheduled Meds: . sodium chloride   Intravenous Once  . aspirin EC  81 mg Oral Daily  . Chlorhexidine Gluconate Cloth  6 each Topical Daily  . enoxaparin (LOVENOX) injection  40 mg Subcutaneous Q12H  . ezetimibe  10 mg Oral QHS  . fesoterodine  4 mg Oral Daily  . gabapentin  300 mg Oral TID  . insulin aspart  0-20 Units Subcutaneous TID WC  . insulin aspart  0-5 Units Subcutaneous QHS  . insulin glargine  8 Units Subcutaneous Daily  . isosorbide mononitrate  30 mg Oral Daily  . lisinopril  2.5 mg Oral Daily  . methylPREDNISolone (SOLU-MEDROL) injection  40 mg Intravenous Q8H  . metoprolol tartrate  12.5 mg Oral BID  . simvastatin  40 mg Oral QHS  . sodium chloride flush  3 mL Intravenous Q12H   .  vitamin C  500 mg Oral Daily  . zinc sulfate  220 mg Oral Daily   Continuous Infusions: . sodium chloride    . sodium chloride Stopped (06/03/19 1842)    LOS: 6 days   Little Ishikawa, DO [facility]  If 7PM-7AM, please contact night-coverage [email or web address] Password [email or web address] 06/08/2019, 2:53 PM

## 2019-06-08 NOTE — Progress Notes (Addendum)
SATURATION QUALIFICATIONS: (This note is used to comply with regulatory documentation for home oxygen)  Patient Saturations on Room Air at Rest = 94%  Patient Saturations on Room Air while Ambulating = 82%  Patient Saturations on 2 Liters of oxygen while Ambulating = 91-92%  Please briefly explain why patient needs home oxygen: Patient needs O2 with ambulation to maintain saturations greater than 90%.

## 2019-06-09 LAB — GLUCOSE, CAPILLARY
Glucose-Capillary: 139 mg/dL — ABNORMAL HIGH (ref 70–99)
Glucose-Capillary: 232 mg/dL — ABNORMAL HIGH (ref 70–99)

## 2019-06-09 MED ORDER — LISINOPRIL 2.5 MG PO TABS: 2.5000 mg | ORAL_TABLET | Freq: Every day | ORAL | 0 refills | Status: DC

## 2019-06-09 NOTE — TOC Transition Note (Addendum)
Transition of Care [facility]) - CM/SW Discharge Note   Patient Details  Name: [name] MRN: [id number] Date of Birth: 03/03/1941  [facility]) CM/SW Contact:  Ninfa Meeker, RN Phone Number: [phone] (working remotely) 06/09/2019, 9:27 AM   Clinical Narrative:   Case manager spoke with patient via telephone concerning discharge needs.  Choice for DME agency was offered. Patient confirmed address, phone number and says his wife is at home to receive oxygen. Expressed appreciation for assistance. Referral called to Learta Codding with [employer]. [name] was asked to take a tank for transport home to patient's room.    Final next level of care: Home/Self Care Barriers to Discharge: No Barriers Identified   Patient Goals and CMS Choice Patient states their goals for this hospitalization and ongoing recovery are:: get better   Choice offered to / list presented to : Patient  Discharge Placement                       Discharge Plan and Services   Discharge Planning Services: CM Consult Post Acute Care Choice: Durable Medical Equipment          DME Arranged: Oxygen DME Agency: [place] Date DME Agency Contacted: 06/09/19 Time DME Agency Contacted: [phone] Representative spoke with at DME Agency: [place]: NA        Social Determinants of Health ([place]) Interventions     Readmission Risk Interventions No flowsheet data found.

## 2019-06-09 NOTE — Discharge Summary (Signed)
Physician Discharge Summary  [name] [id number] DOB: 01-01-1941 DOA: 06/02/2019  PCP: Ria Bush, MD  Admit date: 06/02/2019 Discharge date: 06/09/2019  Admitted From: Home Disposition: Home  Recommendations for Outpatient Follow-up:  1. Follow up with PCP in 1-2 weeks 2. Please obtain BMP/CBC in one week   Equipment/Devices: Oxygen, 2 L nasal cannula with exertion only  Discharge Condition: Stable CODE STATUS: Full Diet recommendation: As tolerated  Brief/Interim Summary: 78yo WM PMHx  CAD s/p CABG 1990, PVD, prediabetes, OSA not on CPAP, HTN and HLD who was admitted at [facility] 10/15 with covid-19 pneumonia causing hypoxic respiratory failure after initially testing positive in presurgical screening 10/5. Remdesivir and steroids were providded until [facility] admittedby Dr. Shanon Brow. Feels about the same as he has, but oxygen requirement seems to be increasing. He's hungry, feels short of breath with any exertion. No chest pain, no leg swelling.   Patient admitted for acute hypoxic respiratory failure in the setting of COVID-19 pneumonia.  Patient completed antibiotic, antiviral, steroid treatment.  Over the past 72 hours patient has made drastic improvement in symptoms and is now no longer hypoxic at rest and only minimally hypoxic with ambulation requiring 2 L nasal cannula to maintain saturations above 90%.  At this time after further discussion with patient and his daughter and his wife we have agreed that patient is otherwise stable and agreeable for discharge home with close monitoring by his PCP, patient will be discharged with issues of metoprolol and lisinopril and history of CAD and core measures.  Also prescribed patient pulse oximeter to maintain and monitor his oxygen levels.  She otherwise feels quite well, has been requesting discharge home for at least 72 hours as he has felt baseline now for at least 2 days.  Otherwise  stable and agreeable for discharge, close follow-up with PCP in the next 1 to 2 weeks as discussed.   Discharge Diagnoses:  Principal Problem:   Pneumonia due to COVID-19 virus Active Problems:   Essential hypertension   Carotid stenosis   PVD (peripheral vascular disease) with claudication (HCC)   DVT, HX OF   Cervical stenosis of spinal canal   CAD (coronary artery disease), native coronary artery   Acute respiratory failure due to COVID-19 [facility])   Anxiety    Discharge Instructions  Discharge Instructions    Call MD for:  difficulty breathing, headache or visual disturbances   Complete by: As directed    Call MD for:  extreme fatigue   Complete by: As directed    Call MD for:  hives   Complete by: As directed    Call MD for:  persistant dizziness or light-headedness   Complete by: As directed    Call MD for:  persistant nausea and vomiting   Complete by: As directed    Call MD for:  redness, tenderness, or signs of infection (pain, swelling, redness, odor or green/yellow discharge around incision site)   Complete by: As directed    Call MD for:  severe uncontrolled pain   Complete by: As directed    Call MD for:  temperature >100.4   Complete by: As directed    Diet - low sodium heart healthy   Complete by: As directed    Increase activity slowly   Complete by: As directed      Allergies as of 06/09/2019      Reactions   Vioxx [rofecoxib] Other (See Comments)   Hemorrhage  Contrast Media [iodinated Diagnostic Agents] Itching, Rash   Delayed reaction post abdominal aortagram.    Morphine Nausea Only, Other (See Comments)   Irritability      Medication List    STOP taking these medications   azithromycin 250 MG tablet Commonly known as: Zithromax   predniSONE 10 MG tablet Commonly known as: DELTASONE     TAKE these medications   acetaminophen 650 MG CR tablet Commonly known as: TYLENOL Take 1,300 mg by mouth every 8 (eight) hours as needed for  pain.   albuterol 108 (90 Base) MCG/ACT inhaler Commonly known as: VENTOLIN HFA Inhale 2 puffs into the lungs every 6 (six) hours as needed for wheezing or shortness of breath.   aspirin EC 81 MG tablet Take 1 tablet (81 mg total) by mouth daily.   B-12 1000 MCG Subl Place 1 tablet under the tongue daily. What changed: when to take this   ezetimibe 10 MG tablet Commonly known as: ZETIA TAKE ONE TABLET BY MOUTH EVERY DAY What changed: when to take this   gabapentin 300 MG capsule Commonly known as: NEURONTIN Take 300 mg by mouth 3 (three) times daily.   HYDROcodone-homatropine 5-1.5 MG/5ML syrup Commonly known as: HYCODAN Take 5 mLs by mouth every 6 (six) hours as needed for cough.   isosorbide mononitrate 30 MG 24 hr tablet Commonly known as: IMDUR Take 1 tablet (30 mg total) by mouth 2 (two) times a day.   ketoconazole 2 % cream Commonly known as: NIZORAL Apply 1 application topically daily as needed for irritation.   lisinopril 2.5 MG tablet Commonly known as: ZESTRIL Take 1 tablet (2.5 mg total) by mouth daily. Start taking on: June 10, 2019   nitroGLYCERIN 0.4 MG SL tablet Commonly known as: NITROSTAT Place 1 tablet (0.4 mg total) under the tongue every 5 (five) minutes as needed for chest pain.   polyethylene glycol 17 g packet Commonly known as: MIRALAX / GLYCOLAX Take 17 g by mouth every other day.   ranolazine 500 MG 12 hr tablet Commonly known as: RANEXA Take 1 tablet (500 mg total) by mouth 2 (two) times daily for 14 days.   simvastatin 40 MG tablet Commonly known as: ZOCOR TAKE ONE TABLET BY MOUTH EVERY DAY What changed: when to take this   tolterodine 4 MG 24 hr capsule Commonly known as: DETROL LA Take 4 mg by mouth daily.            Durable Medical Equipment  (From admission, onward)         Start     Ordered   06/09/19 1009  For home use only DME oxygen  Once    Question Answer Comment  Length of Need 6 Months   Mode or  (Route) Nasal cannula   Liters per Minute 2   Frequency Continuous (stationary and portable oxygen unit needed)   Oxygen delivery system Gas      06/09/19 1208   06/09/19 1009  For home use only DME Pulse oximeter  Once     06/09/19 [place] Follow up.   Why: A representative from [employer] will deliver concentrator and oxygen tanks to your home. Should you have any problems or question concerning tanks please call number listed for [employer] Contact information: [place] [address] [phone]          Allergies  Allergen Reactions  .  Vioxx [Rofecoxib] Other (See Comments)    Hemorrhage   . Contrast Media [Iodinated Diagnostic Agents] Itching and Rash    Delayed reaction post abdominal aortagram.   . Morphine Nausea Only and Other (See Comments)    Irritability      Procedures/Studies: Dg Chest Port 1 View  Result Date: 06/02/2019 CLINICAL DATA:  78 year old male with history of chest pain. COVID positive patient. EXAM: PORTABLE CHEST 1 VIEW COMPARISON:  Chest x-ray 05/30/2019. FINDINGS: Lung volumes are low. Patchy multifocal interstitial and airspace disease scattered asymmetrically throughout the lungs bilaterally, increased compared to the prior study, most confluent in the left mid to lower lung. No pleural effusions. No evidence of pulmonary edema. Heart size is normal. Upper mediastinal contours are within normal limits. Status post median sternotomy. IMPRESSION: 1. Increasing severe multilobar bilateral pneumonia, as above. Electronically Signed   By: Vinnie Langton M.D.   On: 06/02/2019 18:04   Dg Chest Portable 1 View  Result Date: 05/30/2019 CLINICAL DATA:  Shortness of breath, fever. EXAM: PORTABLE CHEST 1 VIEW COMPARISON:  May 28, 2019. FINDINGS: The heart size and mediastinal contours are within normal limits. No pneumothorax or pleural effusion is noted. Increased left  midlung and basilar opacities are noted suggesting worsening atelectasis or pneumonia. Stable right lung atelectasis or inflammation. The visualized skeletal structures are unremarkable. IMPRESSION: Increased left lung opacity is noted suggesting worsening atelectasis or pneumonia. Stable right lung atelectasis or inflammation is noted. Electronically Signed   By: Marijo Conception M.D.   On: 05/30/2019 09:54   Dg Chest Portable 1 View  Result Date: 05/28/2019 CLINICAL DATA:  COVID positive.  Fever, fatigue EXAM: PORTABLE CHEST 1 VIEW COMPARISON:  05/24/2019 FINDINGS: Streaky opacities at the left base, slightly increased since prior study. Improved aeration at the right base with no confluent opacity currently. Heart is normal size. No effusions or acute bony abnormality. IMPRESSION: Increasing streaky opacity at the left lung base with improving aeration at the right base. Electronically Signed   By: Rolm Baptise M.D.   On: 05/28/2019 09:33   Dg Chest Portable 1 View  Result Date: 05/24/2019 CLINICAL DATA:  Covid positive shortness of breath EXAM: PORTABLE CHEST 1 VIEW COMPARISON:  June 05, 2017 FINDINGS: The heart size and mediastinal contours are within normal limits. Overlying median sternotomy wires are present. Mildly increased streaky opacity seen at the right lung base. The visualized skeletal structures are unremarkable. IMPRESSION: Mildly increased streaky opacity at the right lung base. This is non-specific could be due to atelectasis and/or early infectious etiology. Electronically Signed   By: Prudencio Pair M.D.   On: 05/24/2019 01:05    Subjective: No acute issues or events overnight, tolerating ambulation without exertional dyspnea although minimally hypoxic requiring 2 L nasal cannula.  Otherwise declines shortness of breath, chest pain, nausea, vomiting, diarrhea, constipation, headache, fevers, chills.   Discharge Exam: Vitals:   06/09/19 0747 06/09/19 1139  BP: 131/82 (!) 132/54   Pulse: 66 60  Resp: 20 18  Temp: (!) 97.5 F (36.4 C) 97.8 F (36.6 C)  SpO2: 91% 94%   Vitals:   06/08/19 2107 06/09/19 0642 06/09/19 0747 06/09/19 1139  BP:  (!) 142/61 131/82 (!) 132/54  Pulse: 76 67 66 60  Resp:   20 18  Temp:  97.6 F (36.4 C) (!) 97.5 F (36.4 C) 97.8 F (36.6 C)  TempSrc: Oral Oral Oral Oral  SpO2: 91% 90% 91% 94%  Weight:      Height:  General:  Pleasantly resting in bed, No acute distress. HEENT:  Normocephalic atraumatic.  Sclerae nonicteric, noninjected.  Extraocular movements intact bilaterally. Neck:  Without mass or deformity.  Trachea is midline. Lungs:  Clear to auscultate bilaterally without rhonchi, wheeze, or rales. Heart:  Regular rate and rhythm.  Without murmurs, rubs, or gallops. Abdomen:  Soft, nontender, nondistended.  Without guarding or rebound. Extremities: Without cyanosis, clubbing, edema, or obvious deformity. Vascular:  Dorsalis pedis and posterior tibial pulses palpable bilaterally. Skin:  Warm and dry, no erythema, no ulcerations.   The results of significant diagnostics from this hospitalization (including imaging, microbiology, ancillary and laboratory) are listed below for reference.     Microbiology: Recent Results (from the past 240 hour(s))  SARS CORONAVIRUS 2 (TAT 6-24 HRS) Nasopharyngeal Nasopharyngeal Swab     Status: Abnormal   Collection Time: 05/31/19  7:44 AM   Specimen: Nasopharyngeal Swab  Result Value Ref Range Status   SARS Coronavirus 2 POSITIVE (A) NEGATIVE Final    Comment: CRITICAL RESULT CALLED TO, READ BACK BY AND VERIFIED WITH: S.SUMMERS RN 1640 05/31/2019 MCCORMICK K (NOTE) SARS-CoV-2 target nucleic acids are DETECTED. The SARS-CoV-2 RNA is generally detectable in upper and lower respiratory specimens during the acute phase of infection. Positive results are indicative of active infection with SARS-CoV-2. Clinical  correlation with patient history and other diagnostic information  is necessary to determine patient infection status. Positive results do  not rule out bacterial infection or co-infection with other viruses. The expected result is Negative. Fact Sheet for Patients: SugarRoll.be Fact Sheet for Healthcare Providers: https://www.woods-mathews.com/ This test is not yet approved or cleared by the [place] FDA and  has been authorized for detection and/or diagnosis of SARS-CoV-2 by FDA under an Emergency Use Authorization (EUA). This EUA will remain  in effect (meaning this test can  be used) for the duration of the COVID-19 declaration under Section 564(b)(1) of the Act, 21 U.S.C. section 360bbb-3(b)(1), unless the authorization is terminated or revoked sooner. Performed at [facility], [place] [address], [place], [place] [address]      Labs: BNP (last 3 results) Recent Labs    06/03/19 0545  BNP 0000000*   Basic Metabolic Panel: Recent Labs  Lab 06/03/19 0545 06/04/19 0540 06/05/19 0500 06/06/19 0535 06/07/19 0136 06/08/19 0041  NA 138 141 136 136  --   --   K 4.1 4.6 4.7 4.7  --   --   CL 102 104 102 102  --   --   CO2 25 26 22 24   --   --   GLUCOSE 267* 117* 107* 113*  --   --   BUN 47* 39* 44* 42*  --   --   CREATININE 1.20 1.05 1.13 1.03  --   --   CALCIUM 8.4* 8.5* 8.3* 8.6*  --   --   MG 2.2 2.3 2.4 2.2 2.4 2.4  PHOS 4.6  --   --   --   --   --    Liver Function Tests: Recent Labs  Lab 06/03/19 0545 06/04/19 0540 06/05/19 0500 06/06/19 0535  AST 48* 44* 37 25  ALT 100* 110* 100* 76*  ALKPHOS 40 40 40 40  BILITOT 1.1 0.7 1.0 0.7  PROT 5.9* 5.7* 5.7* 5.8*  ALBUMIN 3.0* 2.8* 2.8* 2.8*   No results for input(s): LIPASE, AMYLASE in the last 168 hours. No results for input(s): AMMONIA in the last 168 hours. CBC: Recent Labs  Lab 06/03/19  0545 06/04/19 0540 06/05/19 0500 06/06/19 0535  WBC 9.4 11.7* 11.8* 12.7*  NEUTROABS 8.4* 10.6* 10.6* 11.3*  HGB 12.8* 13.7 14.6  14.9  HCT 37.8* 40.2 43.5 44.1  MCV 93.6 93.5 94.2 93.4  PLT 201 217 227 254   CBG: Recent Labs  Lab 06/08/19 1134 06/08/19 1641 06/08/19 2100 06/09/19 0750 06/09/19 1130  GLUCAP 235* 191* 324* 139* 232*      Component Value Date/Time   COLORURINE yellow 07/02/2008 0832   APPEARANCEUR Clear 07/02/2008 0832   LABSPEC 1.015 07/02/2008 0832   PHURINE 5.5 07/02/2008 0832   HGBUR negative 07/02/2008 0832   BILIRUBINUR negative 07/02/2008 0832   UROBILINOGEN 0.2 07/02/2008 0832   NITRITE negative 07/02/2008 0832   Sepsis Labs Invalid input(s): PROCALCITONIN,  WBC,  LACTICIDVEN Microbiology Recent Results (from the past 240 hour(s))  SARS CORONAVIRUS 2 (TAT 6-24 HRS) Nasopharyngeal Nasopharyngeal Swab     Status: Abnormal   Collection Time: 05/31/19  7:44 AM   Specimen: Nasopharyngeal Swab  Result Value Ref Range Status   SARS Coronavirus 2 POSITIVE (A) NEGATIVE Final    Comment: CRITICAL RESULT CALLED TO, READ BACK BY AND VERIFIED WITH: S.SUMMERS RN 1640 05/31/2019 MCCORMICK K (NOTE) SARS-CoV-2 target nucleic acids are DETECTED. The SARS-CoV-2 RNA is generally detectable in upper and lower respiratory specimens during the acute phase of infection. Positive results are indicative of active infection with SARS-CoV-2. Clinical  correlation with patient history and other diagnostic information is necessary to determine patient infection status. Positive results do  not rule out bacterial infection or co-infection with other viruses. The expected result is Negative. Fact Sheet for Patients: SugarRoll.be Fact Sheet for Healthcare Providers: https://www.woods-mathews.com/ This test is not yet approved or cleared by the [place] FDA and  has been authorized for detection and/or diagnosis of SARS-CoV-2 by FDA under an Emergency Use Authorization (EUA). This EUA will remain  in effect (meaning this test can  be used) for the duration  of the COVID-19 declaration under Section 564(b)(1) of the Act, 21 U.S.C. section 360bbb-3(b)(1), unless the authorization is terminated or revoked sooner. Performed at [facility], [place] [address], [place], [place] [address]      Time coordinating discharge: Over 30 minutes  SIGNED:   Little Ishikawa, DO [facility] 06/09/2019, 12:15 PM Pager   If 7PM-7AM, please contact night-coverage [email or web address] Password [email or web address]

## 2019-06-09 NOTE — Progress Notes (Signed)
Spoke with patient's wife [name] updated on plan for discharge. [name] will call when home O2 is set up at the house. Patient has portable O2 tank for transportation home at the bedside.

## 2019-06-10 ENCOUNTER — Telehealth: Payer: Self-pay

## 2019-06-10 ENCOUNTER — Encounter: Payer: Self-pay | Admitting: Family Medicine

## 2019-06-10 ENCOUNTER — Ambulatory Visit (INDEPENDENT_AMBULATORY_CARE_PROVIDER_SITE_OTHER): Payer: PPO | Admitting: Family Medicine

## 2019-06-10 VITALS — BP 119/59 | HR 81 | Temp 97.6°F | Ht 72.0 in | Wt 203.0 lb

## 2019-06-10 DIAGNOSIS — R7303 Prediabetes: Secondary | ICD-10-CM

## 2019-06-10 DIAGNOSIS — I10 Essential (primary) hypertension: Secondary | ICD-10-CM

## 2019-06-10 DIAGNOSIS — J1282 Pneumonia due to coronavirus disease 2019: Secondary | ICD-10-CM

## 2019-06-10 DIAGNOSIS — U071 COVID-19: Secondary | ICD-10-CM

## 2019-06-10 DIAGNOSIS — J1289 Other viral pneumonia: Secondary | ICD-10-CM

## 2019-06-10 DIAGNOSIS — I25810 Atherosclerosis of coronary artery bypass graft(s) without angina pectoris: Secondary | ICD-10-CM

## 2019-06-10 DIAGNOSIS — J9600 Acute respiratory failure, unspecified whether with hypoxia or hypercapnia: Secondary | ICD-10-CM | POA: Diagnosis not present

## 2019-06-10 LAB — GLUCOSE, CAPILLARY: Glucose-Capillary: 221 mg/dL — ABNORMAL HIGH (ref 70–99)

## 2019-06-10 NOTE — Telephone Encounter (Signed)
Noted. Will see today.  

## 2019-06-10 NOTE — Telephone Encounter (Signed)
Called and made an appointment for patient today. TCM not done due to appointment set up within 48 hours of D/C date.

## 2019-06-10 NOTE — Assessment & Plan Note (Signed)
It seems BP was elevated during recent hospitalization, discharged on low dose lisinopril - will continue for now, consder discontinuing now he's off steroid.

## 2019-06-10 NOTE — Assessment & Plan Note (Signed)
Reviewed oxygen use.

## 2019-06-10 NOTE — Assessment & Plan Note (Signed)
Deteriorated with insulin use during hospitalization due to prolonged steroid use, now off steroids will continue to monitor for improved sugar levels.

## 2019-06-10 NOTE — Progress Notes (Signed)
Virtual visit completed through [employer]. Due to national recommendations of social distancing due to COVID-19, a virtual visit is felt to be most appropriate for this patient at this time. Reviewed limitations of a virtual visit.   Patient location: home Provider location: [place] at [facility], office If any vitals were documented, they were collected by patient at home unless specified below.    BP (!) 119/59   Pulse 81   Temp 97.6 F (36.4 C)   Ht 6' (1.829 m)   Wt 203 lb (92.1 kg)   SpO2 92%   BMI 27.53 kg/m    CC: hosp f/u visit Covid19 + patient s/p ICU stay  Subjective:    Patient ID: [name], male    DOB: 1941/02/22, 78 y.o.   MRN: [id number]  HPI: [name] is a 78 y.o. male presenting on 06/10/2019 for Hospitalization Follow-up   Recent hospitalization for Covid19 illness, tested positive on 05/20/2019 during preop visit for planned posterior lumbar interbody fusion L4/5 which was subsequently cancelled. Prior to hospitalization, he was diagnosed with Covid PNA by CXR at ER visit 05/22/2019 treated with azithromycin and prednisone taper. During latest hospitalization, he was treated with remdesivir and solumedrol, Covid convalescent plasma, as well as supplemental oxygen given he developed acute hypoxic respiratory failure. Did not need intubation but he was in the ICU for 1 day. Prior to discharge he was able to maintain O2 sats above 90% with oxygen support via 2L [place] with exertion only. Fully off steroids. He states he was told upon discharge to self quarantine for 2wks from discharge.   Was started on lisinopril 2.5mg  daily for elevated blood pressures.   Since home, still fatigued but slowly improving. Ongoing dyspnea especially noted with exertion. Appetite good.   Was advised to be tested for OSA with sleep study.    Admit date: 05/30/2019 --> transferred to [place] on 06/02/2019 Discharge date: 06/09/2019 TCM hosp f/u phone call: not  completed as pt seen within 24 hours of discharge from hospital.  Admitted From: Home Disposition: Home  Recommendations for Outpatient Follow-up:  1. Follow up with PCP in 1-2 weeks 2. Please obtain BMP/CBC in one week  Equipment/Devices: Oxygen, 2 L nasal cannula with exertion only  Discharge Condition: Stable CODE STATUS: Full Diet recommendation: As tolerated  Discharge Diagnoses:  Principal Problem:   Pneumonia due to COVID-19 virus Active Problems:   Essential hypertension   Carotid stenosis   PVD (peripheral vascular disease) with claudication (HCC)   DVT, HX OF   Cervical stenosis of spinal canal   CAD (coronary artery disease), native coronary artery   Acute respiratory failure due to COVID-19 [facility])   Anxiety     Relevant past medical, surgical, family and social history reviewed and updated as indicated. Interim medical history since our last visit reviewed. Allergies and medications reviewed and updated. Outpatient Medications Prior to Visit  Medication Sig Dispense Refill  . acetaminophen (TYLENOL) 650 MG CR tablet Take 1,300 mg by mouth every 8 (eight) hours as needed for pain.    Marland Kitchen albuterol (VENTOLIN HFA) 108 (90 Base) MCG/ACT inhaler Inhale 2 puffs into the lungs every 6 (six) hours as needed for wheezing or shortness of breath. 18 g 3  . aspirin EC 81 MG tablet Take 1 tablet (81 mg total) by mouth daily. 90 tablet 3  . Cyanocobalamin (B-12) 1000 MCG SUBL Place 1 tablet under the tongue daily. (Patient taking differently: Place 1 tablet under the  tongue daily with lunch. ) 30 each   . ezetimibe (ZETIA) 10 MG tablet TAKE ONE TABLET BY MOUTH EVERY DAY (Patient taking differently: Take 10 mg by mouth at bedtime. ) 90 tablet 3  . gabapentin (NEURONTIN) 300 MG capsule Take 300 mg by mouth 3 (three) times daily.     Marland Kitchen HYDROcodone-homatropine (HYCODAN) 5-1.5 MG/5ML syrup Take 5 mLs by mouth every 6 (six) hours as needed for cough. 120 mL 0  . ketoconazole (NIZORAL)  2 % cream Apply 1 application topically daily as needed for irritation.     Marland Kitchen lisinopril (ZESTRIL) 2.5 MG tablet Take 1 tablet (2.5 mg total) by mouth daily. 30 tablet 0  . nitroGLYCERIN (NITROSTAT) 0.4 MG SL tablet Place 1 tablet (0.4 mg total) under the tongue every 5 (five) minutes as needed for chest pain. 25 tablet 3  . polyethylene glycol (MIRALAX / GLYCOLAX) 17 g packet Take 17 g by mouth every other day.     . simvastatin (ZOCOR) 40 MG tablet TAKE ONE TABLET BY MOUTH EVERY DAY (Patient taking differently: Take 40 mg by mouth at bedtime. ) 90 tablet 3  . tolterodine (DETROL LA) 4 MG 24 hr capsule Take 4 mg by mouth daily.    . isosorbide mononitrate (IMDUR) 30 MG 24 hr tablet Take 1 tablet (30 mg total) by mouth 2 (two) times a day. 180 tablet 3  . ranolazine (RANEXA) 500 MG 12 hr tablet Take 1 tablet (500 mg total) by mouth 2 (two) times daily for 14 days. (Patient not taking: Reported on 05/30/2019) 28 tablet 11   No facility-administered medications prior to visit.      Per HPI unless specifically indicated in ROS section below Review of Systems Objective:    BP (!) 119/59   Pulse 81   Temp 97.6 F (36.4 C)   Ht 6' (1.829 m)   Wt 203 lb (92.1 kg)   SpO2 92%   BMI 27.53 kg/m   Wt Readings from Last 3 Encounters:  06/10/19 203 lb (92.1 kg)  06/02/19 202 lb 13.2 oz (92 kg)  06/01/19 204 lb 5.9 oz (92.7 kg)     Physical exam: Gen: alert, NAD, not ill appearing Pulm: speaks in complete sentences without increased work of breathing Psych: normal mood, normal thought content      Assessment & Plan:   Problem List Items Addressed This Visit    Prediabetes    Deteriorated with insulin use during hospitalization due to prolonged steroid use, now off steroids will continue to monitor for improved sugar levels.       Pneumonia due to COVID-19 virus - Primary    Slow recovery of pneumonia after severe illness from covid19 infection requiring hospitalization, ICU stay and  ongoing supplemental oxygen use at night and with ambulation. He did receive remdesivir as well as convalescent plasma. Anticipated possibly prolonged course of recovery reviewed with patient. Will call later in the week for f/u, and if continues improving will have him come in next week for labwork. Pt agrees with plan, questions answered.       Essential hypertension    It seems BP was elevated during recent hospitalization, discharged on low dose lisinopril - will continue for now, consder discontinuing now he's off steroid.       CAD, ARTERY BYPASS GRAFT    Continue aspirin, statin, ranexa, imdur.       Acute respiratory failure due to COVID-19 [facility])    Reviewed oxygen use.  No orders of the defined types were placed in this encounter.  No orders of the defined types were placed in this encounter.   I discussed the assessment and treatment plan with the patient. The patient was provided an opportunity to ask questions and all were answered. The patient agreed with the plan and demonstrated an understanding of the instructions. The patient was advised to call back or seek an in-person evaluation if the symptoms worsen or if the condition fails to improve as anticipated.  Follow up plan: No follow-ups on file.  Ria Bush, MD

## 2019-06-10 NOTE — Assessment & Plan Note (Signed)
Continue aspirin, statin, ranexa, imdur.

## 2019-06-10 NOTE — Telephone Encounter (Signed)
plz call Wednesday afternoon for f/u on dyspnea and oxygenation post Covid19 PNA.  If doing well, will want to schedule lab only visit for next week.

## 2019-06-10 NOTE — Telephone Encounter (Signed)
Noted  

## 2019-06-10 NOTE — Assessment & Plan Note (Signed)
Slow recovery of pneumonia after severe illness from covid19 infection requiring hospitalization, ICU stay and ongoing supplemental oxygen use at night and with ambulation. He did receive remdesivir as well as convalescent plasma. Anticipated possibly prolonged course of recovery reviewed with patient. Will call later in the week for f/u, and if continues improving will have him come in next week for labwork. Pt agrees with plan, questions answered.

## 2019-06-12 NOTE — Telephone Encounter (Signed)
Noted. plz schedule lab visit for mid next week (labs ordered) Call day prior to ensure continued improvement in respiratory symptoms and no new fever.

## 2019-06-12 NOTE — Addendum Note (Signed)
Addended by: Ria Bush on: 06/12/2019 05:13 PM   Modules accepted: Orders

## 2019-06-12 NOTE — Telephone Encounter (Signed)
Spoke with pt to see how he is doing.  States he still has some SOB and now thinks he may have a cold.  Has had some sneezing and mild ST.  Says he is able to walk with O2 (2 L) and do minor daily activities at home.  SpO2 is 95%-96% with O2 and will drop to 89%-90% w/o O2.

## 2019-06-13 ENCOUNTER — Telehealth: Payer: Self-pay

## 2019-06-13 DIAGNOSIS — U071 COVID-19: Secondary | ICD-10-CM | POA: Diagnosis not present

## 2019-06-13 NOTE — Telephone Encounter (Addendum)
Noted. Pt scheduled for lab visit on 06/19/19 at 1:15.   Added to call list to call on 06/18/19.

## 2019-06-13 NOTE — Telephone Encounter (Signed)
Pt was in ICU 05/30/19 due to covid; pt discharged home and had HFU on 06/10/19. Since 06/12/19 pt started with fever; now fever is 101.3 and pt is SOB while talking on phone now. Pt is presently on oxygen at 3 L. Pt has no CP or wheezing and no H/A or dizziness. Pt's wife will go take pt to [facility] ED for in person evaluation. DrG out of office until 06/18/19. Will send note to Dr Darnell Level and Dr Damita Dunnings as Juluis Rainier.

## 2019-06-13 NOTE — Telephone Encounter (Signed)
Agree with ER eval.  Thanks.  Routed to PCP as FYI.

## 2019-06-16 ENCOUNTER — Encounter: Payer: Self-pay | Admitting: Family Medicine

## 2019-06-17 ENCOUNTER — Telehealth: Payer: Self-pay

## 2019-06-17 MED ORDER — FUROSEMIDE 20 MG PO TABS: 20.0000 mg | ORAL_TABLET | Freq: Every day | ORAL | 0 refills | Status: DC | PRN

## 2019-06-17 NOTE — Telephone Encounter (Addendum)
Pt said FBS has been high since in [place];  On 06/16/19 pt 2 hr after supper meal was 220.  FBS today was 114. Pt said his fever is gone. Pt came home on 2 L and pt  is presently on 3 L during the day and increases oxygen to 4L.  Also concerned about both legs swelling below the knee since return home from hospital. Pt was taking Lasix while in the hospital but since home pt has not been on any diuretic. Pt said that during the night his feet and legs swelling does not go down. Pt is not taking any med for blood sugars. Pt is willing to try the glipizide and will ck BS; How often and when should pt ck BS and if BS is how low should pt not take the glipizide.  Pt also said he was sent home with BP pill lisinopril 2.5 mg. Pt took lisinopril x 1; pt took BP 90 over something and pt stopped taking BP med. Next day pt did not feel "draggy" and pt has not taken any more lisinopril. Today pts BP 133/69 on no BP med. [facility]. Pt request cb after Dr Darnell Level sends in med and reviews this note. Pt is scheduled for labs 06/19/19 and pt wants to know if he should come n back door and have lab draw or will someone come to car to draw blood. Pt has no covid symptoms except fatigue, no travel and no known exposure to + covid. Pt said when he was discharged he was told " the virus is dead in you". Pt request cb.

## 2019-06-17 NOTE — Telephone Encounter (Signed)
Did not seek ER care over weekend. See latest mychart message.

## 2019-06-17 NOTE — Telephone Encounter (Signed)
Received from patient on mychart stating below:  "I need help.  My blood sugar is way too high and I need help getting it under control.  Also, my legs and feet are swollen, which makes it hard to walk.  And I cannot come off the oxygen.  Actually, I have had to increase the oxygen since coming home from the hospital.  I also have bleeding in my nose and it gets clogged at night causing trouble breathing.  I would like to have a face to face visit if possible."  Does it need to be triaged? Also I do not think he can come in for face to face yet due to symptoms?  [name] or Rollene Fare please call when able and speak with patient. Thank you

## 2019-06-17 NOTE — Telephone Encounter (Signed)
See pt message 06/16/19

## 2019-06-17 NOTE — Telephone Encounter (Signed)
I sent this note from patient to Joshua to call and triage. Not sure if patient can come in yet for face to face visit with symptoms.

## 2019-06-17 NOTE — Telephone Encounter (Signed)
Both Dr. Darnell Level and I are out of the clinic today.   If his fasting blood sugar is only 114 then I would not recommend starting any diabetic medication right now because the risk of hypoglycemia may be too high.  His sugar may continue to improve as he has more time off of steroids.  I would keep checking his fasting blood sugar in the mornings over the next few days and let us know how those are running. I think it makes sense to stay off lisinopril for now. I would try Lasix once daily as needed for swelling in the meantime.  I sent a short-term prescription.  If lightheaded or if swelling is significantly better then skip that day's dose of Lasix. According to the EMR he tested positive for Covid approximately 1 month ago, so I thought he would be able to come in for labs at this point.  If worse in the meantime then he needs to get reevaluated.  Thanks.

## 2019-06-17 NOTE — Addendum Note (Signed)
Addended by: Tonia Ghent on: 06/17/2019 12:48 PM   Modules accepted: Orders

## 2019-06-18 ENCOUNTER — Emergency Department (HOSPITAL_COMMUNITY): Payer: PPO

## 2019-06-18 ENCOUNTER — Other Ambulatory Visit: Payer: Self-pay

## 2019-06-18 ENCOUNTER — Inpatient Hospital Stay (HOSPITAL_COMMUNITY)
Admission: EM | Admit: 2019-06-18 | Discharge: 2019-06-25 | DRG: 177 | Disposition: A | Discharge status: Home / Self Care | Payer: PPO | Attending: Internal Medicine | Admitting: Internal Medicine

## 2019-06-18 ENCOUNTER — Encounter (HOSPITAL_COMMUNITY): Payer: Self-pay

## 2019-06-18 DIAGNOSIS — I5043 Acute on chronic combined systolic (congestive) and diastolic (congestive) heart failure: Secondary | ICD-10-CM | POA: Diagnosis not present

## 2019-06-18 DIAGNOSIS — I252 Old myocardial infarction: Secondary | ICD-10-CM

## 2019-06-18 DIAGNOSIS — Z91041 Radiographic dye allergy status: Secondary | ICD-10-CM | POA: Diagnosis not present

## 2019-06-18 DIAGNOSIS — I110 Hypertensive heart disease with heart failure: Secondary | ICD-10-CM | POA: Diagnosis present

## 2019-06-18 DIAGNOSIS — E1151 Type 2 diabetes mellitus with diabetic peripheral angiopathy without gangrene: Secondary | ICD-10-CM | POA: Diagnosis not present

## 2019-06-18 DIAGNOSIS — I6529 Occlusion and stenosis of unspecified carotid artery: Secondary | ICD-10-CM | POA: Diagnosis present

## 2019-06-18 DIAGNOSIS — R079 Chest pain, unspecified: Secondary | ICD-10-CM | POA: Diagnosis not present

## 2019-06-18 DIAGNOSIS — I10 Essential (primary) hypertension: Secondary | ICD-10-CM | POA: Diagnosis present

## 2019-06-18 DIAGNOSIS — J1289 Other viral pneumonia: Secondary | ICD-10-CM | POA: Diagnosis not present

## 2019-06-18 DIAGNOSIS — I5041 Acute combined systolic (congestive) and diastolic (congestive) heart failure: Secondary | ICD-10-CM | POA: Diagnosis not present

## 2019-06-18 DIAGNOSIS — R609 Edema, unspecified: Secondary | ICD-10-CM | POA: Diagnosis not present

## 2019-06-18 DIAGNOSIS — Z951 Presence of aortocoronary bypass graft: Secondary | ICD-10-CM | POA: Diagnosis not present

## 2019-06-18 DIAGNOSIS — U071 COVID-19: Principal | ICD-10-CM | POA: Diagnosis present

## 2019-06-18 DIAGNOSIS — I6523 Occlusion and stenosis of bilateral carotid arteries: Secondary | ICD-10-CM

## 2019-06-18 DIAGNOSIS — Z808 Family history of malignant neoplasm of other organs or systems: Secondary | ICD-10-CM

## 2019-06-18 DIAGNOSIS — I071 Rheumatic tricuspid insufficiency: Secondary | ICD-10-CM | POA: Diagnosis present

## 2019-06-18 DIAGNOSIS — R001 Bradycardia, unspecified: Secondary | ICD-10-CM | POA: Diagnosis not present

## 2019-06-18 DIAGNOSIS — Z96643 Presence of artificial hip joint, bilateral: Secondary | ICD-10-CM | POA: Diagnosis not present

## 2019-06-18 DIAGNOSIS — Z8249 Family history of ischemic heart disease and other diseases of the circulatory system: Secondary | ICD-10-CM

## 2019-06-18 DIAGNOSIS — N400 Enlarged prostate without lower urinary tract symptoms: Secondary | ICD-10-CM | POA: Diagnosis present

## 2019-06-18 DIAGNOSIS — E785 Hyperlipidemia, unspecified: Secondary | ICD-10-CM | POA: Diagnosis not present

## 2019-06-18 DIAGNOSIS — Z885 Allergy status to narcotic agent status: Secondary | ICD-10-CM | POA: Diagnosis not present

## 2019-06-18 DIAGNOSIS — J9601 Acute respiratory failure with hypoxia: Secondary | ICD-10-CM | POA: Diagnosis present

## 2019-06-18 DIAGNOSIS — Z79899 Other long term (current) drug therapy: Secondary | ICD-10-CM | POA: Diagnosis not present

## 2019-06-18 DIAGNOSIS — Z96651 Presence of right artificial knee joint: Secondary | ICD-10-CM | POA: Diagnosis not present

## 2019-06-18 DIAGNOSIS — R0602 Shortness of breath: Secondary | ICD-10-CM

## 2019-06-18 DIAGNOSIS — I25119 Atherosclerotic heart disease of native coronary artery with unspecified angina pectoris: Secondary | ICD-10-CM | POA: Diagnosis present

## 2019-06-18 DIAGNOSIS — I5030 Unspecified diastolic (congestive) heart failure: Secondary | ICD-10-CM | POA: Diagnosis not present

## 2019-06-18 DIAGNOSIS — Z87891 Personal history of nicotine dependence: Secondary | ICD-10-CM | POA: Diagnosis not present

## 2019-06-18 DIAGNOSIS — Z823 Family history of stroke: Secondary | ICD-10-CM

## 2019-06-18 DIAGNOSIS — J1282 Pneumonia due to coronavirus disease 2019: Secondary | ICD-10-CM | POA: Diagnosis present

## 2019-06-18 DIAGNOSIS — Z7982 Long term (current) use of aspirin: Secondary | ICD-10-CM

## 2019-06-18 DIAGNOSIS — K219 Gastro-esophageal reflux disease without esophagitis: Secondary | ICD-10-CM | POA: Diagnosis present

## 2019-06-18 DIAGNOSIS — G4733 Obstructive sleep apnea (adult) (pediatric): Secondary | ICD-10-CM | POA: Diagnosis not present

## 2019-06-18 DIAGNOSIS — Z66 Do not resuscitate: Secondary | ICD-10-CM | POA: Diagnosis present

## 2019-06-18 DIAGNOSIS — I739 Peripheral vascular disease, unspecified: Secondary | ICD-10-CM | POA: Diagnosis not present

## 2019-06-18 DIAGNOSIS — E1169 Type 2 diabetes mellitus with other specified complication: Secondary | ICD-10-CM | POA: Diagnosis present

## 2019-06-18 DIAGNOSIS — Z841 Family history of disorders of kidney and ureter: Secondary | ICD-10-CM

## 2019-06-18 DIAGNOSIS — Z833 Family history of diabetes mellitus: Secondary | ICD-10-CM

## 2019-06-18 DIAGNOSIS — I209 Angina pectoris, unspecified: Secondary | ICD-10-CM | POA: Diagnosis not present

## 2019-06-18 DIAGNOSIS — E782 Mixed hyperlipidemia: Secondary | ICD-10-CM | POA: Diagnosis not present

## 2019-06-18 LAB — CBC WITH DIFFERENTIAL/PLATELET
Abs Immature Granulocytes: 0.01 10*3/uL (ref 0.00–0.07)
Basophils Absolute: 0 10*3/uL (ref 0.0–0.1)
Basophils Relative: 0 %
Eosinophils Absolute: 0.1 10*3/uL (ref 0.0–0.5)
Eosinophils Relative: 2 %
HCT: 33.9 % — ABNORMAL LOW (ref 39.0–52.0)
Hemoglobin: 10.9 g/dL — ABNORMAL LOW (ref 13.0–17.0)
Immature Granulocytes: 0 %
Lymphocytes Relative: 12 %
Lymphs Abs: 0.7 10*3/uL (ref 0.7–4.0)
MCH: 31.6 pg (ref 26.0–34.0)
MCHC: 32.2 g/dL (ref 30.0–36.0)
MCV: 98.3 fL (ref 80.0–100.0)
Monocytes Absolute: 0.4 10*3/uL (ref 0.1–1.0)
Monocytes Relative: 7 %
Neutro Abs: 4.2 10*3/uL (ref 1.7–7.7)
Neutrophils Relative %: 79 %
Platelets: 155 10*3/uL (ref 150–400)
RBC: 3.45 MIL/uL — ABNORMAL LOW (ref 4.22–5.81)
RDW: 12.6 % (ref 11.5–15.5)
WBC: 5.4 10*3/uL (ref 4.0–10.5)
nRBC: 0 % (ref 0.0–0.2)

## 2019-06-18 LAB — COMPREHENSIVE METABOLIC PANEL
ALT: 33 U/L (ref 0–44)
AST: 25 U/L (ref 15–41)
Albumin: 2.2 g/dL — ABNORMAL LOW (ref 3.5–5.0)
Alkaline Phosphatase: 55 U/L (ref 38–126)
Anion gap: 13 (ref 5–15)
BUN: 21 mg/dL (ref 8–23)
CO2: 26 mmol/L (ref 22–32)
Calcium: 8.8 mg/dL — ABNORMAL LOW (ref 8.9–10.3)
Chloride: 102 mmol/L (ref 98–111)
Creatinine, Ser: 1.1 mg/dL (ref 0.61–1.24)
GFR calc Af Amer: >60 mL/min (ref >60)
GFR calc non Af Amer: >60 mL/min (ref >60)
Glucose, Bld: 105 mg/dL — ABNORMAL HIGH (ref 70–99)
Potassium: 4.4 mmol/L (ref 3.5–5.1)
Sodium: 141 mmol/L (ref 135–145)
Total Bilirubin: 0.6 mg/dL (ref 0.3–1.2)
Total Protein: 6.3 g/dL — ABNORMAL LOW (ref 6.5–8.1)

## 2019-06-18 LAB — LACTIC ACID, PLASMA: Lactic Acid, Venous: 1.2 mmol/L (ref 0.5–1.9)

## 2019-06-18 LAB — BRAIN NATRIURETIC PEPTIDE: B Natriuretic Peptide: 216 pg/mL — ABNORMAL HIGH (ref 0.0–100.0)

## 2019-06-18 LAB — POCT I-STAT 7, (LYTES, BLD GAS, ICA,H+H)
Bicarbonate: 24.6 mmol/L (ref 20.0–28.0)
Calcium, Ion: 1.18 mmol/L (ref 1.15–1.40)
HCT: 48 % (ref 39.0–52.0)
Hemoglobin: 16.3 g/dL (ref 13.0–17.0)
O2 Saturation: 97 %
Patient temperature: 98.7
Potassium: 4.4 mmol/L (ref 3.5–5.1)
Sodium: 138 mmol/L (ref 135–145)
TCO2: 26 mmol/L (ref 22–32)
pCO2 arterial: 39.8 mmHg (ref 32.0–48.0)
pH, Arterial: 7.399 (ref 7.350–7.450)
pO2, Arterial: 91 mmHg (ref 83.0–108.0)

## 2019-06-18 LAB — LACTATE DEHYDROGENASE: LDH: 202 U/L — ABNORMAL HIGH (ref 98–192)

## 2019-06-18 LAB — D-DIMER, QUANTITATIVE: D-Dimer, Quant: 1.52 ug/mL-FEU — ABNORMAL HIGH (ref 0.00–0.50)

## 2019-06-18 LAB — TRIGLYCERIDES: Triglycerides: 78 mg/dL (ref <150)

## 2019-06-18 LAB — C-REACTIVE PROTEIN: CRP: 14.2 mg/dL — ABNORMAL HIGH (ref <1.0)

## 2019-06-18 LAB — FIBRINOGEN: Fibrinogen: >800 mg/dL — ABNORMAL HIGH (ref 210–475)

## 2019-06-18 LAB — FERRITIN: Ferritin: 890 ng/mL — ABNORMAL HIGH (ref 24–336)

## 2019-06-18 MED ORDER — IOHEXOL 350 MG/ML SOLN
100.0000 mL | Freq: Once | INTRAVENOUS | Status: AC | PRN
  Administered 2019-06-18: 75 mL via INTRAVENOUS

## 2019-06-18 MED ORDER — HYDROCORTISONE NA SUCCINATE PF 250 MG IJ SOLR
200.0000 mg | Freq: Once | INTRAMUSCULAR | Status: AC
  Administered 2019-06-18: 200 mg via INTRAVENOUS
  Filled 2019-06-18: qty 200

## 2019-06-18 MED ORDER — DIPHENHYDRAMINE HCL 25 MG PO CAPS: 50.0000 mg | ORAL_CAPSULE | Freq: Once | ORAL | Status: AC

## 2019-06-18 MED ORDER — DIPHENHYDRAMINE HCL 50 MG/ML IJ SOLN
50.0000 mg | Freq: Once | INTRAMUSCULAR | Status: AC
  Administered 2019-06-18: 50 mg via INTRAVENOUS
  Filled 2019-06-18 (×2): qty 1

## 2019-06-18 NOTE — Telephone Encounter (Signed)
Pt notified as instructed and pt voiced understanding. Pt said last night he started running another fever. Pt said he had his oxygen turned up to 4 1/2 - 5 L last night and that did help breathing and pt actually rested most of the night. Pt is sounding SOB on the phone now and he has already decided he is going to [facility] ED. Pt is concerned he may have pneumonia. FYI to Dr Darnell Level.

## 2019-06-18 NOTE — H&P (Signed)
[name] [id number] DOB: 01-08-1941 DOA: 06/18/2019     PCP: Ria Bush, MD   Outpatient Specialists:   CARDS:   Dr. Candis Musa    Patient arrived to ER on 06/18/19 at 1219  Patient coming from: home Lives  With family    Chief Complaint:  Chief Complaint  Patient presents with   Shortness of Breath    HPI: [name] is a 78 y.o. male with medical history significant of  CAD s/p CABG 1990, PVD, prediabetes, OSA not on CPAP, HTN and HLD who was admitted at [facility] 10/15 with covid-19 pneumonia causing hypoxic respiratory failure after initially testing positive in presurgical screening 10/5 story of OSA does not tolerate CPAP    Presented with worsening shortness of breath While at [facility] patient has been treated with remdesivir and steroids he was able to be finally discharged home on 2 L of oxygen 25 October He reports after he went home he initiated did okay but lately has developed worsening shortness of breath he increase his oxygen up to 4 L to feel more comfortable.  On October 28  he started to run another fever up to 101.3 has not had any chest pain or wheezing no headaches. He noticed a little bit of leg swelling but he attributed to not taking diuretics as they were prescribed while he was hospitalized. His blood pressure has been running running low in the 90s so he stopped using his lisinopril.  His blood pressure after that improved to 130s over 60s For his edema his primary care provider prescribed Lasix for him yesterday.  His wife also recently was diagnosed with Covid and has not been doing well.  He is unable to take care of self and his wife also unable to provide him care secondary to illness.  Origally diagnosed on 10/5 when he was prescreened for back surgery  Infectious risk factors:  Reports  shortness of breath, dry cough,   In  ER RAPID COVID TEST POSITIVE,    Lab Results  Component Value Date   SARSCOV2NAA POSITIVE (A)  05/31/2019   SARSCOV2NAA POSITIVE (A) 05/20/2019     Regarding pertinent Chronic problems:    Hyperlipidemia -  on statins Zetia and Zocor   HTN on used to be on lisinopril was holding it for a bit used to be also on Imdur No echogram in the system     CAD  - On Aspirin, statin,                  -  followed by cardiology                - last cardiac cath was in 2016 Showing diffuse disease but patent grafts was started on Ranexa for angina   DM 2 -  Lab Results  Component Value Date   HGBA1C 6.4 12/04/2018   PO meds only, diet controlled    OSA -does not tolerate CPAP   While in ER:  The following Work up has been ordered so far:  Orders Placed This Encounter  Procedures   Blood culture (routine x 2)   DG Chest Port 1 View   CT Angio Chest PE W/Cm &/Or Wo Cm   CBC with Differential   Comprehensive metabolic panel   Brain natriuretic peptide   Lactic acid, plasma   D-dimer, quantitative   Procalcitonin   Lactate dehydrogenase   Ferritin   Triglycerides   Fibrinogen   C-reactive  protein   Diet NPO time specified   If O2 Sat <94% administer O2 at 2 liters/minute via nasal cannula   Check Pulse Oximetry while ambulating   Cardiac monitoring   Initiate Carrier Fluid Protocol   Place surgical mask on patient   Patient to wear surgical mask during transportation   RN/NT - Document specific oxygen requirements in [facility]   Notify EDP if new oxygen requirements escalates > 4L per minute [place]   Consult to hospitalist  ALL PATIENTS BEING ADMITTED/HAVING PROCEDURES NEED COVID-19 SCREENING   Airborne and Contact precautions   Pulse oximetry, continuous   I-Stat arterial blood gas, ED   I-STAT 7, (LYTES, BLD GAS, ICA, H+H)   ED EKG   EKG 12-Lead     Following Medications were ordered in ER: Medications  hydrocortisone sodium succinate (SOLU-CORTEF) injection 200 mg (200 mg Intravenous Given 06/18/19 1655)  diphenhydrAMINE (BENADRYL) capsule  50 mg ( Oral See Alternative 06/18/19 1957)    Or  diphenhydrAMINE (BENADRYL) injection 50 mg (50 mg Intravenous Given 06/18/19 1957)  iohexol (OMNIPAQUE) 350 MG/ML injection 100 mL (75 mLs Intravenous Contrast Given 06/18/19 2047)        Consult Orders  (From admission, onward)         Start     Ordered   06/18/19 2144  Consult to hospitalist  ALL PATIENTS BEING ADMITTED/HAVING PROCEDURES NEED COVID-19 SCREENING PAGED [facility]  Once    Comments: ALL PATIENTS BEING ADMITTED/HAVING PROCEDURES NEED COVID-19 SCREENING  Provider:  (Not yet assigned)  Question Answer Comment  Place call to: [facility]   Reason for Consult Admit      06/18/19 2143          Significant initial  Findings: Abnormal Labs Reviewed  CBC WITH DIFFERENTIAL/PLATELET - Abnormal; Notable for the following components:      Result Value   RBC 3.45 (*)    Hemoglobin 10.9 (*)    HCT 33.9 (*)    All other components within normal limits  COMPREHENSIVE METABOLIC PANEL - Abnormal; Notable for the following components:   Glucose, Bld 105 (*)    Calcium 8.8 (*)    Total Protein 6.3 (*)    Albumin 2.2 (*)    All other components within normal limits  BRAIN NATRIURETIC PEPTIDE - Abnormal; Notable for the following components:   B Natriuretic Peptide 216.0 (*)    All other components within normal limits  D-DIMER, QUANTITATIVE (NOT AT [facility]) - Abnormal; Notable for the following components:   D-Dimer, Quant 1.52 (*)    All other components within normal limits  LACTATE DEHYDROGENASE - Abnormal; Notable for the following components:   LDH 202 (*)    All other components within normal limits  FERRITIN - Abnormal; Notable for the following components:   Ferritin 890 (*)    All other components within normal limits  FIBRINOGEN - Abnormal; Notable for the following components:   Fibrinogen >800 (*)    All other components within normal limits  C-REACTIVE PROTEIN - Abnormal; Notable for the following  components:   CRP 14.2 (*)    All other components within normal limits    Otherwise labs showing:    Recent Labs  Lab 06/18/19 1343 06/18/19 2229  NA 141 138  K 4.4 4.4  CO2 26  --   GLUCOSE 105*  --   BUN 21  --   CREATININE 1.10  --   CALCIUM 8.8*  --     Cr  stable,    Lab Results  Component Value Date   CREATININE 1.10 06/18/2019   CREATININE 1.03 06/06/2019   CREATININE 1.13 06/05/2019    Recent Labs  Lab 06/18/19 1343  AST 25  ALT 33  ALKPHOS 55  BILITOT 0.6  PROT 6.3*  ALBUMIN 2.2*   Lab Results  Component Value Date   CALCIUM 8.8 (L) 06/18/2019   PHOS 4.6 06/03/2019       WBC       Component Value Date/Time   WBC 5.4 06/18/2019 1343   ANC    Component Value Date/Time   NEUTROABS 4.2 06/18/2019 1343   NEUTROABS 2.8 08/31/2018 1046   ALC No components found for: LYMPHAB    Plt: Lab Results  Component Value Date   PLT 155 06/18/2019    Lactic Acid, Venous    Component Value Date/Time   LATICACIDVEN 1.2 06/18/2019 2150    Procalcitonin <0.1   COVID-19 Labs  Recent Labs    06/18/19 2150  DDIMER 1.52*  FERRITIN 890*  LDH 202*  CRP 14.2*    Lab Results  Component Value Date   SARSCOV2NAA POSITIVE (A) 05/31/2019   SARSCOV2NAA POSITIVE (A) 05/20/2019     ABG    Component Value Date/Time   PHART 7.399 06/18/2019 2229   PCO2ART 39.8 06/18/2019 2229   PO2ART 91.0 06/18/2019 2229   HCO3 24.6 06/18/2019 2229   TCO2 26 06/18/2019 2229   O2SAT 97.0 06/18/2019 2229    HG/HCT   Stable,     Component Value Date/Time   HGB 16.3 06/18/2019 2229   HGB 14.0 08/31/2018 1046   HCT 48.0 06/18/2019 2229   HCT 39.5 08/31/2018 1046     Troponin  ordered   ECG: Ordered Personally reviewed by me showing: HR : 68 Rhythm:  NSR, fascicular block   no evidence of ischemic changes QTC 408   BNP (last 3 results) Recent Labs    06/03/19 0545 06/18/19 1522  BNP 155.1* 216.0*    ProBNP (last 3 results) No results for  input(s): PROBNP in the last 8760 hours.  DM  labs:  HbA1C: Recent Labs    12/04/18 1133  HGBA1C 6.4      UA not ordered      Ordered    CXR -multifocal infiltrates suggestive of some improvement no pulmonary venous congestion    CTA chest -  no PE, progressive infiltrates      ED Triage Vitals [06/18/19 1309]  Enc Vitals Group     BP (!) 132/57     Pulse Rate 71     Resp (!) 21     Temp 98.2 F (36.8 C)     Temp src      SpO2 92 %     Weight      Height      Head Circumference      Peak Flow      Pain Score      Pain Loc      Pain Edu?      Excl. in [place]?   [id number]       Latest  Blood pressure (!) 143/57, pulse 73, temperature 98.2 F (36.8 C), resp. rate (!) 25, SpO2 95 %.  Hospitalist was called for admission for COVID-19 positive test (U07.1, COVID-19) with Acute Pneumonia (J12.89, Other viral pneumonia) (If respiratory failure or sepsis present, add as separate assessment)    Review of Systems:    Pertinent positives include: Fevers, chills, fatigue,  shortness of breath at rest.  dyspnea on exertion,  Bilateral lower extremity swelling   Constitutional:  No weight loss, night sweats, weight loss  HEENT:  No headaches, Difficulty swallowing,Tooth/dental problems,Sore throat,  No sneezing, itching, ear ache, nasal congestion, post nasal drip,  Cardio-vascular:  No chest pain, Orthopnea, PND, anasarca, dizziness, palpitations.noGI:  No heartburn, indigestion, abdominal pain, nausea, vomiting, diarrhea, change in bowel habits, loss of appetite, melena, blood in stool, hematemesis Resp:  no No excess mucus, no productive cough, No non-productive cough, No coughing up of blood.No change in color of mucus.No wheezing. Skin:  no rash or lesions. No jaundice GU:  no dysuria, change in color of urine, no urgency or frequency. No straining to urinate.  No flank pain.  Musculoskeletal:  No joint pain or no joint swelling. No decreased range of motion.  No back pain.  Psych:  No change in mood or affect. No depression or anxiety. No memory loss.  Neuro: no localizing neurological complaints, no tingling, no weakness, no double vision, no gait abnormality, no slurred speech, no confusion  All systems reviewed and apart from [place] all are negative  Past Medical History:   Past Medical History:  Diagnosis Date   BENIGN PROSTATIC HYPERTROPHY, WITH URINARY OBSTRUCTION 09/06/2007   CAD s/p CABG    a. 1990 s/p MI-->CABG x 2; b. 2002 s/p BMS to LCX; c. 06/2015 Cath: LM 50ost, LAD 100ost/p, 68m/d, D2 nl, LCX  patent stent, RCA 80p (small), LIMA->LAD nl, VG->D2 nl-->Med Rx.   Carotid arterial disease ([place])    a. 08/2016 Carotid U/S: <39% bilat.   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 did not tolerate CPAP.   Osteoarthritis    PAD (peripheral artery disease) ([place])    a. 07/2017 LE duplex: RSFA 75-78m, LSFA 75-53m, 50-74d.   Pneumonia 07/2014   [facility] hospitalization   Pre-diabetes    PSA, INCREASED 07/09/2008   Spinal stenosis of lumbar region        Past Surgical History:  Procedure Laterality Date   ABDOMINAL AORTOGRAM W/LOWER EXTREMITY [name] 09/12/2018   Procedure: ABDOMINAL AORTOGRAM W/LOWER EXTREMITY;  Surgeon: [name], MD;  Location: [place] [facility];  Service: Cardiovascular;  Laterality: [name];   APPENDECTOMY     rupture   CARDIAC  CATHETERIZATION [name] 07/07/2015   Procedure: Left Heart Cath and Cors/Grafts Angiography;  Surgeon: [name], MD;  Location: [place] [facility];  Service: Cardiovascular;  Laterality: [name];   CATARACT EXTRACTION, BILATERAL     CERVICAL SPINE SURGERY  12/2016   cervical stenosis [name])   COLONOSCOPY  03/2010   HP polyp, diverticulosis, rpt 10 yrs ([name])   CORONARY ARTERY BYPASS GRAFT  1990   3 vessel    CYSTOSCOPY  12/23/10   [place]   EYE SURGERY     cataract   KNEE ARTHROSCOPY Right  x2   LAMINOTOMY  1986   L5/S1 lumbar laminotomy for two ruptured discs/fusion   LUMBAR LAMINECTOMY/DECOMPRESSION MICRODISCECTOMY [name] 07/13/2016   Procedure: LUMBAR TWO-THREE, LUMBAR THREE-FOUR, LUMBAR FOUR-FIVE LAMINECTOMY AND FORAMINOTOMY;  Surgeon: [name], MD;  Location: [place];  Service: Neurosurgery;  Laterality: [name];  LAMINECTOMY AND FORAMINOTOMY L2-L3, L3-L4,L4-L5   TOTAL HIP ARTHROPLASTY Bilateral 1999,2000   TOTAL KNEE ARTHROPLASTY Right 03/06/2017   Procedure: RIGHT TOTAL KNEE ARTHROPLASTY;  Surgeon: Gaynelle Arabian, MD;  Location: [facility];  Service: Orthopedics;  Laterality: Right;    Social History:  Ambulatory   Gilford Rile     reports that he quit smoking about 30 years ago. His smoking use included cigarettes. He has a 25.00 pack-year smoking history. He has never used smokeless tobacco. He reports that he does not drink alcohol or use drugs.   Family History:   Family History  Problem Relation Age of Onset   Stroke Mother    Heart attack Mother    Diabetes Mother    Stroke Father    Heart disease Father    Kidney disease Father        PCKD   Cancer Sister        throat   Diabetes Brother    Kidney disease Sister        PCKD   Cancer Other        5/7 nephews with lung cancer    Allergies: Allergies  Allergen Reactions   Vioxx [Rofecoxib] Other (See Comments)    Hemorrhage    Contrast Media [Iodinated Diagnostic Agents] Itching and Rash      Delayed reaction post abdominal aortagram.    Morphine Nausea Only and Other (See Comments)    Irritability      Prior to Admission medications   Medication Sig Start Date End Date Taking? Authorizing Provider  acetaminophen (TYLENOL) 650 MG CR tablet Take 1,300 mg by mouth every 8 (eight) hours as needed for pain.    [provider]  albuterol (VENTOLIN HFA) 108 (90 Base) MCG/ACT inhaler Inhale 2 puffs into the lungs every 6 (six) hours as needed for wheezing or shortness of breath. 05/22/19   Ria Bush, MD  aspirin EC 81 MG tablet Take 1 tablet (81 mg total) by mouth daily. 05/01/17   [name], MD  Cyanocobalamin (B-12) 1000 MCG SUBL Place 1 tablet under the tongue daily. Patient taking differently: Place 1 tablet under the tongue daily with lunch.  12/07/18   Ria Bush, MD  ezetimibe (ZETIA) 10 MG tablet TAKE ONE TABLET BY MOUTH EVERY DAY Patient taking differently: Take 10 mg by mouth at bedtime.  04/02/19   [name], MD  furosemide (LASIX) 20 MG tablet Take 1 tablet (20 mg total) by mouth daily as needed for fluid. 06/17/19   Tonia Ghent, MD  gabapentin (NEURONTIN) 300 MG capsule Take 300 mg by mouth 3 (three) times daily.     [provider]  HYDROcodone-homatropine (HYCODAN) 5-1.5 MG/5ML syrup Take 5 mLs by mouth every 6 (six) hours as needed for cough. 05/24/19   Hinda Kehr, MD  isosorbide mononitrate (IMDUR) 30 MG 24 hr tablet Take 1 tablet (30 mg total) by mouth 2 (two) times a day. 02/20/19 06/02/19  [name], MD  ketoconazole (NIZORAL) 2 % cream Apply 1 application topically daily as needed for irritation.     [provider]  nitroGLYCERIN (NITROSTAT) 0.4 MG SL tablet Place 1 tablet (0.4 mg total) under the  tongue every 5 (five) minutes as needed for chest pain. 12/07/18   Ria Bush, MD  polyethylene glycol (MIRALAX / GLYCOLAX) 17 g packet Take 17 g by mouth every other day.     [provider]  ranolazine (RANEXA) 500 MG 12 hr tablet Take 1 tablet (500 mg total) by mouth 2 (two) times daily for 14 days. Patient not taking: Reported on 05/30/2019 04/17/19 05/13/20  [name], MD  simvastatin (ZOCOR) 40 MG tablet TAKE ONE TABLET BY MOUTH EVERY DAY Patient taking differently: Take 40 mg by mouth at bedtime.  04/02/19   [name], MD  tolterodine (DETROL LA) 4 MG 24 hr capsule Take 4 mg by mouth daily.    [provider]   Physical Exam: Blood pressure (!) 143/57, pulse 73, temperature 98.2 F (36.8 C), resp. rate (!) 25, SpO2 95 %. 1. General:  in No Acute distress   Chronically ill -appearing 2. Psychological: Alert and  Oriented 3. Head/ENT:     Dry Mucous Membranes                          Head Non traumatic, neck supple                           Poor Dentition 4. SKIN:  decreased Skin turgor,  Skin clean Dry and intact no rash 5. Heart: Regular rate and rhythm no Murmur, no Rub or gallop 6. Lungs:   no wheezes or crackles   7. Abdomen: Soft, non-tender, Non distended  Obese bowel sounds present 8. Lower extremities: no clubbing, cyanosis, no edema 9. Neurologically  strength 5 out of 5 in all 4 extremities cranial nerves II through XII intact 10. MSK: Normal range of motion   All other LABS:     Recent Labs  Lab 06/18/19 1343 06/18/19 2229  WBC 5.4  --   NEUTROABS 4.2  --   HGB 10.9* 16.3  HCT 33.9* 48.0  MCV 98.3  --   PLT 155  --      Recent Labs  Lab 06/18/19 1343 06/18/19 2229  NA 141 138  K 4.4 4.4  CL 102  --   CO2 26  --   GLUCOSE 105*  --   BUN 21  --   CREATININE 1.10  --   CALCIUM 8.8*  --      Recent Labs  Lab 06/18/19 1343  AST 25  ALT 33  ALKPHOS 55  BILITOT 0.6  PROT 6.3*  ALBUMIN 2.2*       Cultures:    Component Value Date/Time   SDES BLOOD LEFT ARM 05/30/2019 0944   SDES BLOOD RIGHT HAND 05/30/2019 0944   SPECREQUEST  05/30/2019 0944    BOTTLES DRAWN AEROBIC AND ANAEROBIC Blood Culture adequate  volume   SPECREQUEST  05/30/2019 0944    BOTTLES DRAWN AEROBIC AND ANAEROBIC Blood Culture adequate volume   CULT  05/30/2019 0944    NO GROWTH 5 DAYS Performed at [facility], [address], [place], [place] [address]    CULT  05/30/2019 [id number]    NO GROWTH 5 DAYS Performed at [facility], [address] [place], [place] [address]    REPTSTATUS 06/04/2019 FINAL 05/30/2019 0944   REPTSTATUS 06/04/2019 FINAL 05/30/2019 0944     Radiological Exams on Admission: Ct Angio Chest Pe W/cm &/or Wo Cm  Result Date: 06/18/2019  CLINICAL DATA:  COVID-19 positive, worsening shortness of breath and increasing O2 requirement, EXAM: CT ANGIOGRAPHY CHEST WITH CONTRAST TECHNIQUE: Multidetector CT imaging of the chest was performed using the standard protocol during bolus administration of intravenous contrast. Multiplanar CT image reconstructions and MIPs were obtained to evaluate the vascular anatomy. CONTRAST:  19mL OMNIPAQUE IOHEXOL 350 MG/ML SOLN COMPARISON:  Radiograph 06/18/2019, CT 07/03/2013 FINDINGS: Cardiovascular: Evaluation of the lungs beyond the lobar level is limited due to respiratory motion artifact. Satisfactory opacification of the pulmonary arteries without central or lobar filling defect. No pulmonary artery filling defects are identified. Central pulmonary arteries are borderline enlarged but without elevation of the RV/LV ratio (0.8). Patient is post CABG with mediastinal surgical clips and postsurgical changes in the anterior mediastinum. Calcification of the native coronary arteries is noted. No pericardial effusion. The normal caliber thoracic aorta and proximal great vessels are calcified. No acute luminal irregularity is seen. No periaortic stranding. Mediastinum/Nodes: Low-attenuation mediastinal and hilar lymph nodes are nonenlarged but likely reactive. No axillary adenopathy. Thyroid gland and thoracic inlet are unremarkable. No acute abnormality of the trachea or  esophagus. Lungs/Pleura: Mild central airways thickening. Diffuse multifocal areas of consolidation and ground-glass opacity throughout the lungs compatible with atypical infection in the setting of known COVID-19 positivity. Small bilateral effusions are present. No abnormal pleural enhancement or thickening to suggest developing empyema at this time. No pneumothorax. Upper Abdomen: Stable 16 mm hypoattenuating structure in the left lobe liver, unchanged since 2014. Likely benign cyst or hemangioma. No acute abnormalities present in the visualized portions of the upper abdomen. Musculoskeletal: Multiple intact sternal sutures are noted. There is bony fusion across the sternotomy site. Multilevel degenerative changes are present in the imaged portions of the spine. No acute osseous abnormality or suspicious osseous lesion. No suspicious chest wall lesion. Review of the MIP images confirms the above findings. IMPRESSION: 1. No evidence of central or lobar pulmonary embolism. Evaluation of the lungs beyond the lobar level is limited due to respiratory motion artifact. 2. Diffuse multifocal areas of consolidation and ground-glass opacity throughout the lungs compatible with atypical infection in the setting of known COVID positivity. 3. Small bilateral effusions. No abnormal pleural enhancement or thickening to suggest developing empyema at this time. 4. Stable 16 mm hypoattenuating focus in the left lobe liver, favor benign cyst or hemangioma. 5.  Aortic Atherosclerosis (ICD10-I70.0). 6. Prior CABG. Electronically Signed   By: Lovena Le M.D.   On: 06/18/2019 22:26   Dg Chest Port 1 View  Result Date: 06/18/2019 CLINICAL DATA:  Shortness of breath. EXAM: PORTABLE CHEST 1 VIEW COMPARISON:  06/02/2019. FINDINGS: Prior CABG. Cardiomegaly. No pulmonary venous congestion. Multifocal bilateral pulmonary infiltrates with slight improvement from prior exam. No pleural effusion or pneumothorax IMPRESSION: 1. Multifocal  bilateral pulmonary infiltrates with slight improvement from prior exam. 2.  Prior CABG.  Cardiomegaly.  No pulmonary venous congestion. Electronically Signed   By: Marcello Moores  Register   On: 06/18/2019 14:03    Chart has been reviewed    Assessment/Plan  78 y.o. male with medical history significant of  CAD s/p CABG 1990, PVD, prediabetes, OSA not on CPAP, HTN and HLD who was admitted at [facility] 10/15 with covid-19 pneumonia causing hypoxic respiratory failure after initially testing positive in presurgical screening 10/5 story of OSA does not tolerate CPAP  Admitted for presumed COVID PNeumonia  Present on Admission:  Pneumonia due to COVID-19 virus -  [place]     -  As per hx pt with evidence of fever Cough:  Chills,   shortness of breath Severe fatigue   - With known exposure to sick contacts - new    Hypoxia  Following concerning LAB/ imaging findings:  CBC: leukopenia, lymphopenia     CRP, LDH: increasde  IL-6 and Ferritin increased   Procalcitonin: low   CXR: hazy bilateral peripheral opacities    CT chest: GGO, consolidation, crazy paving     -Following work-up initiated:          sputum cultures  Ordered 06/19/19, Blood cultures Ordered 06/18/19,    Plan of treatment: - Transfer to [facility] facility if positive and beds are available    -Hypoxia initiate steroids Solu-Medrol 40 every 6 hours pateitnt already recieved remdesivir during last admission - Will follow daily d.dimer - Assess for ability to prone  - Supportive management -Fluid sparing resuscitation  -Provide oxygen as needed currently on   SpO2: 98 % O2 Flow Rate (L/min): 4 L/min - IF d.dimer elvated >5 will increase dose of lovenox -hold off on  antibiotics no evidence worrisome for superinfection     Poor Prognostic factors  78 y.o.  Personal hx of  DM2,   HTN, obesity     Airborne precautions ordered     Will order Airborne and Contact precautions  Family/  patient prognosis discussion:  I have asked case with the  patient  who are aware of patient's prognosis   they would like DNR/DNI      Hyperlipidemia - chronic stable continue homemedications   LEg edema - evidence of mild fluid overload continue lasix  Essential hypertension - continue Imdur  CAD -no longer on Ranexa, currently stable   GERD - stable continue home meds  Other plan as per orders.  DVT prophylaxis:    Lovenox     Code Status:    DNR/DNI  as per patient   I had personally discussed CODE STATUS with patient    Family Communication:   Family not at  Bedside    Disposition Plan:      To home once workup is complete and patient is stable                      Consults called: none  Admission status:  ED Disposition    None      Obs      Level of care        SDU tele indefinitely please discontinue once patient no longer qualifies  Precautions:   Airborne and Contact precautions  PPE: Used by the provider:   P100  eye Goggles,  Gloves    gown   Shaina Gullatt 06/18/2019, 2:03 AM    [facility]     after 2 AM please page floor coverage PA If 7AM-7PM, please contact the day team taking care of the patient using [email or web address]

## 2019-06-18 NOTE — ED Notes (Signed)
IV Team at Bedside. 

## 2019-06-18 NOTE — Telephone Encounter (Signed)
Per notes in 06/16/2019 Mychart encounter, patient was advised to get treatment at ED   Helene Shoe, LPN  [id number] AM   Note Pt notified as instructed and pt voiced understanding. Pt said last night he started running another fever. Pt said he had his oxygen turned up to 4 1/2 - 5 L last night and that did help breathing and pt actually rested most of the night. Pt is sounding SOB on the phone now and he has already decided he is going to [facility] ED. Pt is concerned he may have pneumonia. FYI to Dr Darnell Level.     Lab appt cancelled for tomorrow morning. Spoke with patient, aware that Dr Darnell Level is aware he is in ED and that we will be in touch once he is d/c'd home. Pt was appreciative of the call.   Will forward to Dr Darnell Level as Juluis Rainier.

## 2019-06-18 NOTE — ED Notes (Signed)
Attempted unsuccessfully to obtain 2nd set of cultures.

## 2019-06-18 NOTE — ED Provider Notes (Signed)
[facility] EMERGENCY DEPARTMENT Provider Note   CSN: [id number] Arrival date & time: 06/18/19  1219     History   Chief Complaint Chief Complaint  Patient presents with   Shortness of Breath    HPI [name] is a 78 y.o. male with a past medical history of COVID-19 infection, discharged from hospitalization last week, CAD, hypertension, who presents to ED for evaluation of shortness of breath.  Patient was discharged home on 2 L of oxygen via nasal cannula after his admission.  He had to increase nasal cannula to 3 to 4 L since last night.  Also felt feverish.  He feels that his symptoms have improved but wishes "I could have spent a few more days in the hospital to feel back to normal."  He was given remdesivir and steroids.  He was initially started on azithromycin with and discontinued this.  He denies any chest pain.  Does endorse lower extremity edema and was prescribed Lasix by his PCP yesterday which he has been taking.  Denies any hemoptysis, vomiting, abdominal pain.     HPI  Past Medical History:  Diagnosis Date   BENIGN PROSTATIC HYPERTROPHY, WITH URINARY OBSTRUCTION 09/06/2007   CAD s/p CABG    a. 1990 s/p MI-->CABG x 2; b. 2002 s/p BMS to LCX; c. 06/2015 Cath: LM 50ost, LAD 100ost/p, 58m/d, D2 nl, LCX  patent stent, RCA 80p (small), LIMA->LAD nl, VG->D2 nl-->Med Rx.   Carotid arterial disease ([place])    a. 08/2016 Carotid U/S: <39% bilat.   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 did not tolerate CPAP.   Osteoarthritis    PAD (peripheral artery disease) ([place])    a. 07/2017 LE duplex: RSFA 75-28m, LSFA 75-50m, 50-74d.   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/13/2016   Overweight (BMI 25.0-29.9) 07/04/2016   Low vitamin B12 level 01/01/2016   Medicare annual wellness visit, subsequent 07/03/2015   Advanced care planning/counseling discussion 07/03/2015   Prediabetes 05/04/2015   Angina pectoris ([place]) 05/04/2015   Ex-smoker 05/04/2015   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   Spermatocele 04/02/2013   Syncopal vertigo 11/04/2010   Lumbar disc disease with radiculopathy 11/04/2010   Inguinal hernia 05/26/2010   CAD, ARTERY BYPASS GRAFT 08/11/2009   PVD (peripheral vascular disease) with claudication ([place]) 08/11/2009  DUPUYTREN'S CONTRACTURE, 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   Carotid stenosis 07/09/2008   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   Benign prostatic hyperplasia with urinary obstruction 09/06/2007   Essential hypertension 04/30/2007   GERD 04/30/2007   Hyperlipidemia 04/26/2007   OA (osteoarthritis) of knee 04/26/2007   DVT, HX OF 04/26/2007    Past Surgical History:  Procedure Laterality Date   ABDOMINAL AORTOGRAM W/LOWER EXTREMITY [name] 09/12/2018   Procedure: ABDOMINAL AORTOGRAM W/LOWER EXTREMITY;   Surgeon: [name], MD;  Location: [place] CV LAB;  Service: Cardiovascular;  Laterality: [name];   APPENDECTOMY     rupture   CARDIAC CATHETERIZATION [name] 07/07/2015   Procedure: Left Heart Cath and Cors/Grafts Angiography;  Surgeon: [name], MD;  Location: [place] CV LAB;  Service: Cardiovascular;  Laterality: [name];   CATARACT EXTRACTION, BILATERAL     CERVICAL SPINE SURGERY  12/2016   cervical stenosis [name])   COLONOSCOPY  03/2010   HP polyp, diverticulosis, rpt 10 yrs ([name])   CORONARY ARTERY BYPASS GRAFT  1990   3 vessel    CYSTOSCOPY  12/23/10   [place]   EYE SURGERY     cataract   KNEE ARTHROSCOPY Right    x2   LAMINOTOMY  1986   L5/S1 lumbar laminotomy for two ruptured discs/fusion   LUMBAR LAMINECTOMY/DECOMPRESSION MICRODISCECTOMY [name] 07/13/2016   Procedure: LUMBAR TWO-THREE, LUMBAR THREE-FOUR, LUMBAR FOUR-FIVE LAMINECTOMY AND FORAMINOTOMY;  Surgeon: [name], MD;  Location: [facility];  Service: Neurosurgery;  Laterality: [name];  LAMINECTOMY AND FORAMINOTOMY L2-L3, L3-L4,L4-L5   TOTAL HIP ARTHROPLASTY Bilateral 1999,2000   TOTAL KNEE ARTHROPLASTY Right 03/06/2017   Procedure: RIGHT TOTAL KNEE ARTHROPLASTY;  Surgeon: Gaynelle Arabian, MD;  Location: [facility];  Service: Orthopedics;  Laterality: Right;        Home Medications    Prior to Admission medications   Medication Sig Start Date End Date Taking? Authorizing Provider  acetaminophen (TYLENOL) 650 MG CR tablet Take 1,300 mg by mouth every 8 (eight) hours as needed for pain.    [provider]  albuterol (VENTOLIN HFA) 108 (90 Base) MCG/ACT inhaler Inhale 2 puffs into the lungs every 6 (six) hours as needed for wheezing or shortness of breath. 05/22/19   Ria Bush, MD  aspirin EC 81 MG tablet Take 1 tablet (81 mg total) by mouth daily. 05/01/17   [name], MD  Cyanocobalamin (B-12) 1000 MCG SUBL Place 1 tablet under the tongue daily. Patient taking differently:  Place 1 tablet under the tongue daily with lunch.  12/07/18   Ria Bush, MD  ezetimibe (ZETIA) 10 MG tablet TAKE ONE TABLET BY MOUTH EVERY DAY Patient taking differently: Take 10 mg by mouth at bedtime.  04/02/19   [name], MD  furosemide (LASIX) 20 MG tablet Take 1 tablet (20 mg total) by mouth daily as needed for fluid. 06/17/19   Tonia Ghent, MD  gabapentin (NEURONTIN) 300 MG capsule Take 300 mg by mouth 3 (three) times daily.     [provider]  HYDROcodone-homatropine (HYCODAN) 5-1.5 MG/5ML syrup Take 5 mLs by mouth every 6 (six) hours as needed for cough. 05/24/19   Hinda Kehr, MD  isosorbide mononitrate (IMDUR) 30 MG 24 hr tablet Take 1 tablet (30 mg total) by mouth 2 (two) times a day. 02/20/19 06/02/19  [name], MD  ketoconazole (NIZORAL) 2 % cream Apply 1 application topically daily as needed for irritation.     [provider]  nitroGLYCERIN (NITROSTAT) 0.4 MG SL tablet Place  1 tablet (0.4 mg total) under the tongue every 5 (five) minutes as needed for chest pain. 12/07/18   Ria Bush, MD  polyethylene glycol (MIRALAX / GLYCOLAX) 17 g packet Take 17 g by mouth every other day.     [provider]  ranolazine (RANEXA) 500 MG 12 hr tablet Take 1 tablet (500 mg total) by mouth 2 (two) times daily for 14 days. Patient not taking: Reported on 05/30/2019 04/17/19 05/13/20  [name], MD  simvastatin (ZOCOR) 40 MG tablet TAKE ONE TABLET BY MOUTH EVERY DAY Patient taking differently: Take 40 mg by mouth at bedtime.  04/02/19   [name], MD  tolterodine (DETROL LA) 4 MG 24 hr capsule Take 4 mg by mouth daily.    [provider]    Family History Family History  Problem Relation Age of Onset   Stroke Mother    Heart attack Mother    Diabetes Mother    Stroke Father    Heart disease Father    Kidney disease Father        PCKD   Cancer Sister        throat   Diabetes Brother    Kidney disease  Sister        PCKD   Cancer Other        5/7 nephews with lung cancer    Social History Social History   Tobacco Use   Smoking status: Former Smoker    Packs/day: 1.00    Years: 25.00    Pack years: 25.00    Types: Cigarettes    Quit date: 08/15/1988    Years since quitting: 30.8   Smokeless tobacco: Never Used  Substance Use Topics   Alcohol use: No    Alcohol/week: 0.0 standard drinks   Drug use: No     Allergies   Vioxx [rofecoxib], Contrast media [iodinated diagnostic agents], and Morphine   Review of Systems Review of Systems  Constitutional: Positive for chills. Negative for appetite change and fever.  HENT: Negative for ear pain, rhinorrhea, sneezing and sore throat.   Eyes: Negative for photophobia and visual disturbance.  Respiratory: Positive for shortness of breath. Negative for cough, chest tightness and wheezing.   Cardiovascular: Positive for leg swelling. Negative for chest pain and palpitations.  Gastrointestinal: Negative for abdominal pain, blood in stool, constipation, diarrhea, nausea and vomiting.  Genitourinary: Negative for dysuria, hematuria and urgency.  Musculoskeletal: Negative for myalgias.  Skin: Negative for rash.  Neurological: Negative for dizziness, weakness and light-headedness.     Physical Exam Updated Vital Signs BP (!) 159/77    Pulse 71    Temp 98.2 F (36.8 C)    Resp (!) 27    SpO2 99%   Physical Exam Vitals signs and nursing note reviewed.  Constitutional:      General: He is not in acute distress.    Appearance: He is well-developed.     Comments: 3.5 L being delivered via nasal cannula.  HENT:     Head: Normocephalic and atraumatic.     Nose: Nose normal.  Eyes:     General: No scleral icterus.       Left eye: No discharge.     Conjunctiva/sclera: Conjunctivae normal.  Neck:     Musculoskeletal: Normal range of motion and neck supple.  Cardiovascular:     Rate and Rhythm: Normal rate and regular rhythm.      Heart sounds: Normal heart sounds. No murmur. No friction  rub. No gallop.   Pulmonary:     Effort: Pulmonary effort is normal. No respiratory distress.     Breath sounds: Normal breath sounds.  Abdominal:     General: Bowel sounds are normal. There is no distension.     Palpations: Abdomen is soft.     Tenderness: There is no abdominal tenderness. There is no guarding.  Musculoskeletal: Normal range of motion.     Comments: 1+ pitting edema in bilateral lower extremities.  Skin:    General: Skin is warm and dry.     Findings: No rash.  Neurological:     Mental Status: He is alert.     Motor: No abnormal muscle tone.     Coordination: Coordination normal.      ED Treatments / Results  Labs (all labs ordered are listed, but only abnormal results are displayed) Labs Reviewed  CBC WITH DIFFERENTIAL/PLATELET - Abnormal; Notable for the following components:      Result Value   RBC 3.45 (*)    Hemoglobin 10.9 (*)    HCT 33.9 (*)    All other components within normal limits  COMPREHENSIVE METABOLIC PANEL - Abnormal; Notable for the following components:   Glucose, Bld 105 (*)    Calcium 8.8 (*)    Total Protein 6.3 (*)    Albumin 2.2 (*)    All other components within normal limits  BRAIN NATRIURETIC PEPTIDE - Abnormal; Notable for the following components:   B Natriuretic Peptide 216.0 (*)    All other components within normal limits  CULTURE, BLOOD (ROUTINE X 2)  CULTURE, BLOOD (ROUTINE X 2)  LACTIC ACID, PLASMA  LACTIC ACID, PLASMA  D-DIMER, QUANTITATIVE (NOT AT [facility])  PROCALCITONIN  LACTATE DEHYDROGENASE  FERRITIN  TRIGLYCERIDES  FIBRINOGEN  C-REACTIVE PROTEIN  I-STAT ARTERIAL BLOOD GAS, ED    EKG EKG Interpretation  Date/Time:  Tuesday June 18 2019 13:08:24 EST Ventricular Rate:  68 PR Interval:    QRS Duration: 100 QT Interval:  383 QTC Calculation: 408 R Axis:   -46 Text Interpretation: Sinus arrhythmia Left anterior fascicular block Low voltage,  precordial leads Probable anteroseptal infarct, old No significant change since last tracing Confirmed by Wandra Arthurs [id number]) on 06/18/2019 3:25:37 PM   Radiology Dg Chest Port 1 View  Result Date: 06/18/2019 CLINICAL DATA:  Shortness of breath. EXAM: PORTABLE CHEST 1 VIEW COMPARISON:  06/02/2019. FINDINGS: Prior CABG. Cardiomegaly. No pulmonary venous congestion. Multifocal bilateral pulmonary infiltrates with slight improvement from prior exam. No pleural effusion or pneumothorax IMPRESSION: 1. Multifocal bilateral pulmonary infiltrates with slight improvement from prior exam. 2.  Prior CABG.  Cardiomegaly.  No pulmonary venous congestion. Electronically Signed   By: Marcello Moores  Register   On: 06/18/2019 14:03    Procedures Procedures (including critical care time)  Medications Ordered in ED Medications  hydrocortisone sodium succinate (SOLU-CORTEF) injection 200 mg (200 mg Intravenous Given 06/18/19 1655)  diphenhydrAMINE (BENADRYL) capsule 50 mg ( Oral See Alternative 06/18/19 1957)    Or  diphenhydrAMINE (BENADRYL) injection 50 mg (50 mg Intravenous Given 06/18/19 1957)  iohexol (OMNIPAQUE) 350 MG/ML injection 100 mL (100 mLs Intravenous Contrast Given 06/18/19 2047)     Initial Impression / Assessment and Plan / ED Course  I have reviewed the triage vital signs and the nursing notes.  Pertinent labs & imaging results that were available during my care of the patient were reviewed by me and considered in my medical decision making (see chart for details).  GEROGE CRISMAN was evaluated in Emergency Department on 06/18/2019 for the symptoms described in the history of present illness. He was evaluated in the context of the global COVID-19 pandemic, which necessitated consideration that the patient might be at risk for infection with the SARS-CoV-2 virus that causes COVID-19. Institutional protocols and algorithms that pertain to the evaluation of patients at risk for COVID-19 are in  a state of rapid change based on information released by regulatory bodies including the CDC and federal and state organizations. These policies and algorithms were followed during the patient's care in the ED.   78 year old male with a known COVID-19 infection discharged from admission last week on 2 L of oxygen via nasal cannula presented to the ED for ongoing shortness of breath.  States that he had chills last night and had to increase his supplemental oxygen to 4 L last night.  He was given remdesivir and steroids during his hospital admission.  He denies any chest pain but does endorse leg swelling.  He was prescribed Lasix by his PCP.  On exam patient currently receiving oxygen via nasal cannula at 3-1/2 L.  Oxygen saturations are above 90%.  EKG shows no changes from prior tracings.  Chest x-ray shows multifocal infiltrates with slight improvement from prior.  Pitting edema noted bilateral lower extremities.  Patient ambulated with 2 L of oxygen with oxygen saturations around 90 to 92%.  Pending CTA of the chest to rule out PE.  Patient premedicated due to his contrast dye allergy.  While waiting for CTA, I spoke to hospitalist Dr. Criss Alvine regarding recommendations.  He asked that we obtain Covid markers and ABG in addition to the CTA.  We will contact him once we get these results and he will consider admission if necessary. Care handed off to oncoming provider.  Final Clinical Impressions(s) / ED Diagnoses   Final diagnoses:  2019 novel coronavirus disease (COVID-19)    ED Discharge Orders    None       Delia Heady, PA-C 06/18/19 2158    Virgel Manifold, MD 06/19/19 [phone]

## 2019-06-18 NOTE — ED Notes (Signed)
Hospital bed requested for patient per RN request

## 2019-06-18 NOTE — ED Notes (Signed)
Ambulated Pt in Room:  Pt began on 2L [place] at approx 95-97% saturation. Pt then began to ambulate and O2 saturation dropped to as low as 90% and was steady at 92%-93%. Pt returned to bed and dropped as low as 89% but then began to increase to 95-97% which is where he is at now.  Slight increase in work of breathing but no dizziness or trouble with gait. Pt resting comfortably in bed on 2L.

## 2019-06-18 NOTE — ED Triage Notes (Signed)
Per daughter, pt presents w/worsening SOB, increasing O2 at home to 4.5 L/[place]. Pt dc from [facility] 8 days ago w/orders for RA and [place] as needed. Pt has using it at night mostly, as needed during the day. The last 2 days having a need for increase O2, BLE pitting edema, unable to feel like he can take a deep breath.

## 2019-06-18 NOTE — ED Provider Notes (Signed)
Recently admitted with COVID, home last week on 2L O2. Has been progressively more SOB, fever x 2-3 days.   Pending CTA, COVID markers, ABG in evaluation for re-admission.   11:45 - Discussed negative CTA with the patient. Labs are in progress - ABG normal, d-dimer 1.52, ferritin 890.  The patient is uncomfortable with discharge home. This was discussed with Dr. Roel Cluck of [facility] who accepts for admission.    Charlann Lange, PA-C 06/18/19 2359    Charlesetta Shanks, MD 06/28/19 [phone]

## 2019-06-18 NOTE — ED Notes (Signed)
[name] [id number] patients daughter is requesting a call back for an update when available

## 2019-06-19 ENCOUNTER — Other Ambulatory Visit: Payer: PPO

## 2019-06-19 DIAGNOSIS — R0602 Shortness of breath: Secondary | ICD-10-CM | POA: Diagnosis present

## 2019-06-19 DIAGNOSIS — E1151 Type 2 diabetes mellitus with diabetic peripheral angiopathy without gangrene: Secondary | ICD-10-CM | POA: Diagnosis present

## 2019-06-19 DIAGNOSIS — I5030 Unspecified diastolic (congestive) heart failure: Secondary | ICD-10-CM | POA: Diagnosis not present

## 2019-06-19 DIAGNOSIS — R001 Bradycardia, unspecified: Secondary | ICD-10-CM | POA: Diagnosis present

## 2019-06-19 DIAGNOSIS — J1289 Other viral pneumonia: Secondary | ICD-10-CM | POA: Diagnosis present

## 2019-06-19 DIAGNOSIS — J9601 Acute respiratory failure with hypoxia: Secondary | ICD-10-CM | POA: Diagnosis present

## 2019-06-19 DIAGNOSIS — G4733 Obstructive sleep apnea (adult) (pediatric): Secondary | ICD-10-CM | POA: Diagnosis present

## 2019-06-19 DIAGNOSIS — K219 Gastro-esophageal reflux disease without esophagitis: Secondary | ICD-10-CM

## 2019-06-19 DIAGNOSIS — I10 Essential (primary) hypertension: Secondary | ICD-10-CM

## 2019-06-19 DIAGNOSIS — Z96651 Presence of right artificial knee joint: Secondary | ICD-10-CM | POA: Diagnosis present

## 2019-06-19 DIAGNOSIS — Z951 Presence of aortocoronary bypass graft: Secondary | ICD-10-CM | POA: Diagnosis not present

## 2019-06-19 DIAGNOSIS — I110 Hypertensive heart disease with heart failure: Secondary | ICD-10-CM | POA: Diagnosis present

## 2019-06-19 DIAGNOSIS — Z87891 Personal history of nicotine dependence: Secondary | ICD-10-CM | POA: Diagnosis not present

## 2019-06-19 DIAGNOSIS — I6529 Occlusion and stenosis of unspecified carotid artery: Secondary | ICD-10-CM | POA: Diagnosis present

## 2019-06-19 DIAGNOSIS — I25119 Atherosclerotic heart disease of native coronary artery with unspecified angina pectoris: Secondary | ICD-10-CM | POA: Diagnosis present

## 2019-06-19 DIAGNOSIS — Z91041 Radiographic dye allergy status: Secondary | ICD-10-CM | POA: Diagnosis not present

## 2019-06-19 DIAGNOSIS — Z885 Allergy status to narcotic agent status: Secondary | ICD-10-CM | POA: Diagnosis not present

## 2019-06-19 DIAGNOSIS — I5041 Acute combined systolic (congestive) and diastolic (congestive) heart failure: Secondary | ICD-10-CM | POA: Diagnosis not present

## 2019-06-19 DIAGNOSIS — R079 Chest pain, unspecified: Secondary | ICD-10-CM | POA: Diagnosis not present

## 2019-06-19 DIAGNOSIS — Z7982 Long term (current) use of aspirin: Secondary | ICD-10-CM | POA: Diagnosis not present

## 2019-06-19 DIAGNOSIS — Z96643 Presence of artificial hip joint, bilateral: Secondary | ICD-10-CM | POA: Diagnosis present

## 2019-06-19 DIAGNOSIS — E785 Hyperlipidemia, unspecified: Secondary | ICD-10-CM | POA: Diagnosis present

## 2019-06-19 DIAGNOSIS — E782 Mixed hyperlipidemia: Secondary | ICD-10-CM

## 2019-06-19 DIAGNOSIS — I071 Rheumatic tricuspid insufficiency: Secondary | ICD-10-CM | POA: Diagnosis present

## 2019-06-19 DIAGNOSIS — Z66 Do not resuscitate: Secondary | ICD-10-CM | POA: Diagnosis present

## 2019-06-19 DIAGNOSIS — I209 Angina pectoris, unspecified: Secondary | ICD-10-CM | POA: Diagnosis not present

## 2019-06-19 DIAGNOSIS — U071 COVID-19: Secondary | ICD-10-CM | POA: Diagnosis present

## 2019-06-19 DIAGNOSIS — Z841 Family history of disorders of kidney and ureter: Secondary | ICD-10-CM | POA: Diagnosis not present

## 2019-06-19 DIAGNOSIS — Z79899 Other long term (current) drug therapy: Secondary | ICD-10-CM | POA: Diagnosis not present

## 2019-06-19 DIAGNOSIS — R609 Edema, unspecified: Secondary | ICD-10-CM | POA: Diagnosis not present

## 2019-06-19 DIAGNOSIS — I252 Old myocardial infarction: Secondary | ICD-10-CM | POA: Diagnosis not present

## 2019-06-19 DIAGNOSIS — I5043 Acute on chronic combined systolic (congestive) and diastolic (congestive) heart failure: Secondary | ICD-10-CM | POA: Diagnosis present

## 2019-06-19 LAB — CBC WITH DIFFERENTIAL/PLATELET
Abs Immature Granulocytes: 0.02 10*3/uL (ref 0.00–0.07)
Basophils Absolute: 0 10*3/uL (ref 0.0–0.1)
Basophils Relative: 0 %
Eosinophils Absolute: 0 10*3/uL (ref 0.0–0.5)
Eosinophils Relative: 0 %
HCT: 33.3 % — ABNORMAL LOW (ref 39.0–52.0)
Hemoglobin: 10.8 g/dL — ABNORMAL LOW (ref 13.0–17.0)
Immature Granulocytes: 0 %
Lymphocytes Relative: 6 %
Lymphs Abs: 0.4 10*3/uL — ABNORMAL LOW (ref 0.7–4.0)
MCH: 31.2 pg (ref 26.0–34.0)
MCHC: 32.4 g/dL (ref 30.0–36.0)
MCV: 96.2 fL (ref 80.0–100.0)
Monocytes Absolute: 0.1 10*3/uL (ref 0.1–1.0)
Monocytes Relative: 1 %
Neutro Abs: 5.9 10*3/uL (ref 1.7–7.7)
Neutrophils Relative %: 93 %
Platelets: 142 10*3/uL — ABNORMAL LOW (ref 150–400)
RBC: 3.46 MIL/uL — ABNORMAL LOW (ref 4.22–5.81)
RDW: 12.6 % (ref 11.5–15.5)
WBC: 6.4 10*3/uL (ref 4.0–10.5)
nRBC: 0 % (ref 0.0–0.2)

## 2019-06-19 LAB — CBG MONITORING, ED: Glucose-Capillary: 143 mg/dL — ABNORMAL HIGH (ref 70–99)

## 2019-06-19 LAB — HEMOGLOBIN A1C
Hgb A1c MFr Bld: 7.4 % — ABNORMAL HIGH (ref 4.8–5.6)
Mean Plasma Glucose: 165.68 mg/dL

## 2019-06-19 LAB — COMPREHENSIVE METABOLIC PANEL
ALT: 27 U/L (ref 0–44)
AST: 22 U/L (ref 15–41)
Albumin: 2.5 g/dL — ABNORMAL LOW (ref 3.5–5.0)
Alkaline Phosphatase: 52 U/L (ref 38–126)
Anion gap: 9 (ref 5–15)
BUN: 26 mg/dL — ABNORMAL HIGH (ref 8–23)
CO2: 25 mmol/L (ref 22–32)
Calcium: 8.3 mg/dL — ABNORMAL LOW (ref 8.9–10.3)
Chloride: 104 mmol/L (ref 98–111)
Creatinine, Ser: 1.13 mg/dL (ref 0.61–1.24)
GFR calc Af Amer: >60 mL/min (ref >60)
GFR calc non Af Amer: >60 mL/min (ref >60)
Glucose, Bld: 246 mg/dL — ABNORMAL HIGH (ref 70–99)
Potassium: 4.5 mmol/L (ref 3.5–5.1)
Sodium: 138 mmol/L (ref 135–145)
Total Bilirubin: 0.8 mg/dL (ref 0.3–1.2)
Total Protein: 6.6 g/dL (ref 6.5–8.1)

## 2019-06-19 LAB — GLUCOSE, CAPILLARY
Glucose-Capillary: 139 mg/dL — ABNORMAL HIGH (ref 70–99)
Glucose-Capillary: 207 mg/dL — ABNORMAL HIGH (ref 70–99)
Glucose-Capillary: 313 mg/dL — ABNORMAL HIGH (ref 70–99)

## 2019-06-19 LAB — FERRITIN: Ferritin: 972 ng/mL — ABNORMAL HIGH (ref 24–336)

## 2019-06-19 LAB — PROCALCITONIN
Procalcitonin: <0.1 ng/mL
Procalcitonin: <0.1 ng/mL

## 2019-06-19 LAB — LACTIC ACID, PLASMA: Lactic Acid, Venous: 2.7 mmol/L (ref 0.5–1.9)

## 2019-06-19 LAB — SAMPLE TO BLOOD BANK

## 2019-06-19 LAB — D-DIMER, QUANTITATIVE: D-Dimer, Quant: 1.55 ug/mL-FEU — ABNORMAL HIGH (ref 0.00–0.50)

## 2019-06-19 LAB — TROPONIN I (HIGH SENSITIVITY): Troponin I (High Sensitivity): 8 ng/L (ref <18)

## 2019-06-19 LAB — BRAIN NATRIURETIC PEPTIDE: B Natriuretic Peptide: 275.7 pg/mL — ABNORMAL HIGH (ref 0.0–100.0)

## 2019-06-19 LAB — C-REACTIVE PROTEIN: CRP: 11.4 mg/dL — ABNORMAL HIGH (ref <1.0)

## 2019-06-19 MED ORDER — SIMVASTATIN 20 MG PO TABS
40.0000 mg | ORAL_TABLET | Freq: Every day | ORAL | Status: DC
  Administered 2019-06-19 – 2019-06-24 (×6): 40 mg via ORAL
  Filled 2019-06-19 (×6): qty 2

## 2019-06-19 MED ORDER — EZETIMIBE 10 MG PO TABS
10.0000 mg | ORAL_TABLET | Freq: Every day | ORAL | Status: DC
  Administered 2019-06-19 – 2019-06-24 (×6): 10 mg via ORAL
  Filled 2019-06-19 (×7): qty 1

## 2019-06-19 MED ORDER — SODIUM CHLORIDE 0.9% FLUSH
3.0000 mL | Freq: Two times a day (BID) | INTRAVENOUS | Status: DC
  Administered 2019-06-19 – 2019-06-23 (×2): 3 mL via INTRAVENOUS

## 2019-06-19 MED ORDER — ONDANSETRON HCL 4 MG/2ML IJ SOLN: 4.0000 mg | Freq: Four times a day (QID) | INTRAMUSCULAR | Status: DC | PRN

## 2019-06-19 MED ORDER — INSULIN ASPART 100 UNIT/ML ~~LOC~~ SOLN
0.0000 [IU] | SUBCUTANEOUS | Status: DC
  Administered 2019-06-19: 7 [IU] via SUBCUTANEOUS
  Administered 2019-06-19: 1 [IU] via SUBCUTANEOUS
  Administered 2019-06-19: 18:00:00 3 [IU] via SUBCUTANEOUS
  Administered 2019-06-19 – 2019-06-20 (×2): 1 [IU] via SUBCUTANEOUS
  Administered 2019-06-20: 3 [IU] via SUBCUTANEOUS
  Administered 2019-06-20: 2 [IU] via SUBCUTANEOUS
  Administered 2019-06-20: 3 [IU] via SUBCUTANEOUS

## 2019-06-19 MED ORDER — SODIUM CHLORIDE 0.9% FLUSH
3.0000 mL | Freq: Two times a day (BID) | INTRAVENOUS | Status: DC
  Administered 2019-06-19 – 2019-06-25 (×11): 3 mL via INTRAVENOUS

## 2019-06-19 MED ORDER — ENOXAPARIN SODIUM 40 MG/0.4ML ~~LOC~~ SOLN
40.0000 mg | SUBCUTANEOUS | Status: DC
  Administered 2019-06-19 – 2019-06-25 (×7): 40 mg via SUBCUTANEOUS
  Filled 2019-06-19 (×7): qty 0.4

## 2019-06-19 MED ORDER — ONDANSETRON HCL 4 MG PO TABS: 4.0000 mg | ORAL_TABLET | Freq: Four times a day (QID) | ORAL | Status: DC | PRN

## 2019-06-19 MED ORDER — SODIUM CHLORIDE 0.9% FLUSH: 3.0000 mL | INTRAVENOUS | Status: DC | PRN

## 2019-06-19 MED ORDER — ACETAMINOPHEN 325 MG PO TABS
650.0000 mg | ORAL_TABLET | Freq: Four times a day (QID) | ORAL | Status: DC | PRN
  Administered 2019-06-19 – 2019-06-24 (×6): 650 mg via ORAL
  Filled 2019-06-19 (×7): qty 2

## 2019-06-19 MED ORDER — ISOSORBIDE MONONITRATE ER 30 MG PO TB24
30.0000 mg | ORAL_TABLET | Freq: Every day | ORAL | Status: DC
  Administered 2019-06-19 – 2019-06-24 (×6): 30 mg via ORAL
  Filled 2019-06-19 (×6): qty 1

## 2019-06-19 MED ORDER — HYDROCODONE-ACETAMINOPHEN 5-325 MG PO TABS: 1.0000 | ORAL_TABLET | ORAL | Status: DC | PRN

## 2019-06-19 MED ORDER — GUAIFENESIN-DM 100-10 MG/5ML PO SYRP: 10.0000 mL | ORAL_SOLUTION | ORAL | Status: DC | PRN

## 2019-06-19 MED ORDER — FUROSEMIDE 20 MG PO TABS: 20.0000 mg | ORAL_TABLET | Freq: Every day | ORAL | Status: DC | PRN

## 2019-06-19 MED ORDER — SODIUM CHLORIDE 0.9 % IV SOLN: 250.0000 mL | INTRAVENOUS | Status: DC | PRN

## 2019-06-19 MED ORDER — ALBUTEROL SULFATE HFA 108 (90 BASE) MCG/ACT IN AERS
2.0000 | INHALATION_SPRAY | Freq: Four times a day (QID) | RESPIRATORY_TRACT | Status: DC | PRN
  Filled 2019-06-19: qty 6.7

## 2019-06-19 MED ORDER — FESOTERODINE FUMARATE ER 4 MG PO TB24
4.0000 mg | ORAL_TABLET | Freq: Every day | ORAL | Status: DC
  Administered 2019-06-20 – 2019-06-25 (×6): 4 mg via ORAL
  Filled 2019-06-19 (×9): qty 1

## 2019-06-19 MED ORDER — DEXAMETHASONE SODIUM PHOSPHATE 10 MG/ML IJ SOLN
6.0000 mg | INTRAMUSCULAR | Status: DC
  Administered 2019-06-19: 6 mg via INTRAVENOUS
  Filled 2019-06-19: qty 1

## 2019-06-19 MED ORDER — GABAPENTIN 300 MG PO CAPS
300.0000 mg | ORAL_CAPSULE | Freq: Three times a day (TID) | ORAL | Status: DC
  Administered 2019-06-19 – 2019-06-25 (×20): 300 mg via ORAL
  Filled 2019-06-19 (×20): qty 1

## 2019-06-19 NOTE — Progress Notes (Signed)
PROGRESS NOTE    [name]  [id number] DOB: November 27, 1940 DOA: 06/18/2019 PCP: Ria Bush, MD    Brief Narrative:  78 y.o. male with medical history significant of CAD s/p CABG 1990, PVD, prediabetes, OSA not on CPAP, HTN and HLD who was admitted at [facility] 10/15 with covid-19 pneumonia causing hypoxic respiratory failure after initially testing positive in presurgical screening 10/5 story of OSA does not tolerate CPAP    Presented with worsening shortness of breath While at [facility] patient has been treated with remdesivir and steroids he was able to be finally discharged home on 2 L of oxygen 25 October He reports after he went home he initiated did okay but lately has developed worsening shortness of breath he increase his oxygen up to 4 L to feel more comfortable.  On October 28  he started to run another fever up to 101.3 has not had any chest pain or wheezing no headaches. He noticed a little bit of leg swelling but he attributed to not taking diuretics as they were prescribed while he was hospitalized. His blood pressure has been running running low in the 90s so he stopped using his lisinopril.  His blood pressure after that improved to 130s over 60s For his edema his primary care provider prescribed Lasix for him yesterday.  His wife also recently was diagnosed with Covid and has not been doing well.  He is unable to take care of self and his wife also unable to provide him care secondary to illness.  Origally diagnosed on 10/5 when he was prescreened for back surgery  Assessment & Plan:   Active Problems:   Hyperlipidemia   Essential hypertension   Carotid stenosis   PVD (peripheral vascular disease) with claudication (HCC)   GERD   Pneumonia due to COVID-19 virus   1. PNA secondary to COVID-19 1. COVID pos with CTA chest consistent with findings of covid PNA 2. CTA is neg for PE 3. O2 sats noted to be briefly down to 88% in ED. 4. Started steroids in  ED 5. Procal <0.1, CRP up to 14.2 (was <0.8 on 10/24) 6. Pt transferred to [facility] 2. DM2 1. Glucose trends reviewed, appear stable 2. Continue on SSI coverage 3. Most recent a1c from 11/2018 noted to be 6.4 3. HTN 1. VS reviewed, BP currently stable 2. Pt is continued on lasix and imdur per home regimen 4. Obesity 1. Seems stable at this time 2. Recommend diet/lifestyle modification 5. HLD  1. Seems to be stable 2. Continued on home simvastatin and zetia 6. CAD  1. Stable currently 2. Denies chest pain 7. GERD 1. Seems to be stable at this time  DVT prophylaxis: Lovenox subq Code Status: DNR Family Communication: Pt in room, family not at bedside Disposition Plan: Uncertain at this time  Consultants:     Procedures:     Antimicrobials: Anti-infectives (From admission, onward)   None       Subjective: Reports feeling better. Denies feeling sob  Objective: Vitals:   06/19/19 1000 06/19/19 1053 06/19/19 1100 06/19/19 1200  BP: (!) 141/65 (!) 150/61 (!) 123/53 (!) 134/46  Pulse: 83 95 88 64  Resp: 19 18  20   Temp:  97.9 F (36.6 C)  98.1 F (36.7 C)  TempSrc:  Oral  Oral  SpO2: 98% 100% 99% 97%    Intake/Output Summary (Last 24 hours) at 06/19/2019 1407 Last data filed at 06/19/2019 1344 Gross per 24 hour  Intake 250 ml  Output --  Net 250 ml   There were no vitals filed for this visit.  Examination:  General exam: Appears calm and comfortable  Respiratory system: No audible wheezing, Respiratory effort normal. Cardiovascular system: regular, perfused Gastrointestinal system: Abdomen is nondistended, nontender. Central nervous system: Alert and oriented. No focal neurological deficits. Extremities: Symmetric 5 x 5 power. Skin: No rashes, lesions Psychiatry: Judgement and insight appear normal. Mood & affect appropriate.   Data Reviewed: I have personally reviewed following labs and imaging studies  CBC: Recent Labs  Lab 06/18/19 1343  06/18/19 2229  WBC 5.4  --   NEUTROABS 4.2  --   HGB 10.9* 16.3  HCT 33.9* 48.0  MCV 98.3  --   PLT 155  --    Basic Metabolic Panel: Recent Labs  Lab 06/18/19 1343 06/18/19 2229  NA 141 138  K 4.4 4.4  CL 102  --   CO2 26  --   GLUCOSE 105*  --   BUN 21  --   CREATININE 1.10  --   CALCIUM 8.8*  --    GFR: Estimated Creatinine Clearance: 60.7 mL/min (by C-G formula based on SCr of 1.1 mg/dL). Liver Function Tests: Recent Labs  Lab 06/18/19 1343  AST 25  ALT 33  ALKPHOS 55  BILITOT 0.6  PROT 6.3*  ALBUMIN 2.2*   No results for input(s): LIPASE, AMYLASE in the last 168 hours. No results for input(s): AMMONIA in the last 168 hours. Coagulation Profile: No results for input(s): INR, PROTIME in the last 168 hours. Cardiac Enzymes: No results for input(s): CKTOTAL, CKMB, CKMBINDEX, TROPONINI in the last 168 hours. BNP (last 3 results) No results for input(s): PROBNP in the last 8760 hours. HbA1C: No results for input(s): HGBA1C in the last 72 hours. CBG: Recent Labs  Lab 06/19/19 0957 06/19/19 1205  GLUCAP 143* 139*   Lipid Profile: Recent Labs    06/18/19 2150  TRIG 78   Thyroid Function Tests: No results for input(s): TSH, T4TOTAL, FREET4, T3FREE, THYROIDAB in the last 72 hours. Anemia Panel: Recent Labs    06/18/19 2150  FERRITIN 890*   Sepsis Labs: Recent Labs  Lab 06/18/19 2150  PROCALCITON <0.10  LATICACIDVEN 1.2    Recent Results (from the past 240 hour(s))  Blood culture (routine x 2)     Status: None (Preliminary result)   Collection Time: 06/18/19  3:28 PM   Specimen: BLOOD  Result Value Ref Range Status   Specimen Description BLOOD BLOOD LEFT FOREARM  Final   Special Requests   Final    BOTTLES DRAWN AEROBIC AND ANAEROBIC Blood Culture results may not be optimal due to an inadequate volume of blood received in culture bottles   Culture   Final    NO GROWTH < 24 HOURS Performed at [facility], [place]. [address]  [place], [place] [address]    Report Status PENDING  Incomplete  Blood culture (routine x 2)     Status: None (Preliminary result)   Collection Time: 06/18/19 10:19 PM   Specimen: BLOOD  Result Value Ref Range Status   Specimen Description BLOOD LEFT ANTECUBITAL  Final   Special Requests   Final    BOTTLES DRAWN AEROBIC AND ANAEROBIC Blood Culture adequate volume   Culture   Final    NO GROWTH < 24 HOURS Performed at [facility], [place] [address] [place], [place] [address]    Report Status PENDING  Incomplete     Radiology Studies: Ct  Angio Chest Pe W/cm &/or Wo Cm  Result Date: 06/18/2019 CLINICAL DATA:  COVID-19 positive, worsening shortness of breath and increasing O2 requirement, EXAM: CT ANGIOGRAPHY CHEST WITH CONTRAST TECHNIQUE: Multidetector CT imaging of the chest was performed using the standard protocol during bolus administration of intravenous contrast. Multiplanar CT image reconstructions and MIPs were obtained to evaluate the vascular anatomy. CONTRAST:  37mL OMNIPAQUE IOHEXOL 350 MG/ML SOLN COMPARISON:  Radiograph 06/18/2019, CT 07/03/2013 FINDINGS: Cardiovascular: Evaluation of the lungs beyond the lobar level is limited due to respiratory motion artifact. Satisfactory opacification of the pulmonary arteries without central or lobar filling defect. No pulmonary artery filling defects are identified. Central pulmonary arteries are borderline enlarged but without elevation of the RV/LV ratio (0.8). Patient is post CABG with mediastinal surgical clips and postsurgical changes in the anterior mediastinum. Calcification of the native coronary arteries is noted. No pericardial effusion. The normal caliber thoracic aorta and proximal great vessels are calcified. No acute luminal irregularity is seen. No periaortic stranding. Mediastinum/Nodes: Low-attenuation mediastinal and hilar lymph nodes are nonenlarged but likely reactive. No axillary adenopathy. Thyroid gland and thoracic inlet  are unremarkable. No acute abnormality of the trachea or esophagus. Lungs/Pleura: Mild central airways thickening. Diffuse multifocal areas of consolidation and ground-glass opacity throughout the lungs compatible with atypical infection in the setting of known COVID-19 positivity. Small bilateral effusions are present. No abnormal pleural enhancement or thickening to suggest developing empyema at this time. No pneumothorax. Upper Abdomen: Stable 16 mm hypoattenuating structure in the left lobe liver, unchanged since 2014. Likely benign cyst or hemangioma. No acute abnormalities present in the visualized portions of the upper abdomen. Musculoskeletal: Multiple intact sternal sutures are noted. There is bony fusion across the sternotomy site. Multilevel degenerative changes are present in the imaged portions of the spine. No acute osseous abnormality or suspicious osseous lesion. No suspicious chest wall lesion. Review of the MIP images confirms the above findings. IMPRESSION: 1. No evidence of central or lobar pulmonary embolism. Evaluation of the lungs beyond the lobar level is limited due to respiratory motion artifact. 2. Diffuse multifocal areas of consolidation and ground-glass opacity throughout the lungs compatible with atypical infection in the setting of known COVID positivity. 3. Small bilateral effusions. No abnormal pleural enhancement or thickening to suggest developing empyema at this time. 4. Stable 16 mm hypoattenuating focus in the left lobe liver, favor benign cyst or hemangioma. 5.  Aortic Atherosclerosis (ICD10-I70.0). 6. Prior CABG. Electronically Signed   By: Lovena Le M.D.   On: 06/18/2019 22:26   Dg Chest Port 1 View  Result Date: 06/18/2019 CLINICAL DATA:  Shortness of breath. EXAM: PORTABLE CHEST 1 VIEW COMPARISON:  06/02/2019. FINDINGS: Prior CABG. Cardiomegaly. No pulmonary venous congestion. Multifocal bilateral pulmonary infiltrates with slight improvement from prior exam. No  pleural effusion or pneumothorax IMPRESSION: 1. Multifocal bilateral pulmonary infiltrates with slight improvement from prior exam. 2.  Prior CABG.  Cardiomegaly.  No pulmonary venous congestion. Electronically Signed   By: Marcello Moores  Register   On: 06/18/2019 14:03    Scheduled Meds:  dexamethasone (DECADRON) injection  6 mg Intravenous Q24H   enoxaparin (LOVENOX) injection  40 mg Subcutaneous Q24H   ezetimibe  10 mg Oral QHS   fesoterodine  4 mg Oral Daily   gabapentin  300 mg Oral TID   insulin aspart  0-9 Units Subcutaneous Q4H   isosorbide mononitrate  30 mg Oral Daily   simvastatin  40 mg Oral QHS   sodium chloride flush  3  mL Intravenous Q12H   sodium chloride flush  3 mL Intravenous Q12H   Continuous Infusions:  sodium chloride       LOS: 0 days   Marylu Lund, MD [facility] Pager On Amion  If 7PM-7AM, please contact night-coverage 06/19/2019, 2:07 PM

## 2019-06-19 NOTE — ED Notes (Signed)
Breakfast tray given to patient.

## 2019-06-19 NOTE — Telephone Encounter (Signed)
Noted. Pt admitted.

## 2019-06-19 NOTE — ED Notes (Signed)
Carb modified breakfast ordered

## 2019-06-19 NOTE — ED Notes (Signed)
PT transported via [facility]  To [place]

## 2019-06-20 ENCOUNTER — Inpatient Hospital Stay (HOSPITAL_COMMUNITY): Payer: PPO

## 2019-06-20 DIAGNOSIS — I5030 Unspecified diastolic (congestive) heart failure: Secondary | ICD-10-CM

## 2019-06-20 LAB — CBC WITH DIFFERENTIAL/PLATELET
Abs Immature Granulocytes: 0.02 10*3/uL (ref 0.00–0.07)
Basophils Absolute: 0 10*3/uL (ref 0.0–0.1)
Basophils Relative: 0 %
Eosinophils Absolute: 0 10*3/uL (ref 0.0–0.5)
Eosinophils Relative: 0 %
HCT: 31.1 % — ABNORMAL LOW (ref 39.0–52.0)
Hemoglobin: 10.1 g/dL — ABNORMAL LOW (ref 13.0–17.0)
Immature Granulocytes: 0 %
Lymphocytes Relative: 11 %
Lymphs Abs: 0.7 10*3/uL (ref 0.7–4.0)
MCH: 31.6 pg (ref 26.0–34.0)
MCHC: 32.5 g/dL (ref 30.0–36.0)
MCV: 97.2 fL (ref 80.0–100.0)
Monocytes Absolute: 0.3 10*3/uL (ref 0.1–1.0)
Monocytes Relative: 5 %
Neutro Abs: 5.3 10*3/uL (ref 1.7–7.7)
Neutrophils Relative %: 84 %
Platelets: 132 10*3/uL — ABNORMAL LOW (ref 150–400)
RBC: 3.2 MIL/uL — ABNORMAL LOW (ref 4.22–5.81)
RDW: 12.5 % (ref 11.5–15.5)
WBC: 6.3 10*3/uL (ref 4.0–10.5)
nRBC: 0 % (ref 0.0–0.2)

## 2019-06-20 LAB — GLUCOSE, CAPILLARY
Glucose-Capillary: 104 mg/dL — ABNORMAL HIGH (ref 70–99)
Glucose-Capillary: 119 mg/dL — ABNORMAL HIGH (ref 70–99)
Glucose-Capillary: 142 mg/dL — ABNORMAL HIGH (ref 70–99)
Glucose-Capillary: 193 mg/dL — ABNORMAL HIGH (ref 70–99)
Glucose-Capillary: 219 mg/dL — ABNORMAL HIGH (ref 70–99)

## 2019-06-20 LAB — COMPREHENSIVE METABOLIC PANEL
ALT: 27 U/L (ref 0–44)
AST: 19 U/L (ref 15–41)
Albumin: 2.4 g/dL — ABNORMAL LOW (ref 3.5–5.0)
Alkaline Phosphatase: 47 U/L (ref 38–126)
Anion gap: 10 (ref 5–15)
BUN: 28 mg/dL — ABNORMAL HIGH (ref 8–23)
CO2: 26 mmol/L (ref 22–32)
Calcium: 8.4 mg/dL — ABNORMAL LOW (ref 8.9–10.3)
Chloride: 104 mmol/L (ref 98–111)
Creatinine, Ser: 1.05 mg/dL (ref 0.61–1.24)
GFR calc Af Amer: >60 mL/min (ref >60)
GFR calc non Af Amer: >60 mL/min (ref >60)
Glucose, Bld: 138 mg/dL — ABNORMAL HIGH (ref 70–99)
Potassium: 4.1 mmol/L (ref 3.5–5.1)
Sodium: 140 mmol/L (ref 135–145)
Total Bilirubin: 0.6 mg/dL (ref 0.3–1.2)
Total Protein: 5.9 g/dL — ABNORMAL LOW (ref 6.5–8.1)

## 2019-06-20 LAB — FERRITIN: Ferritin: 809 ng/mL — ABNORMAL HIGH (ref 24–336)

## 2019-06-20 LAB — C-REACTIVE PROTEIN: CRP: 7 mg/dL — ABNORMAL HIGH (ref <1.0)

## 2019-06-20 LAB — PROCALCITONIN: Procalcitonin: <0.1 ng/mL

## 2019-06-20 LAB — D-DIMER, QUANTITATIVE: D-Dimer, Quant: 1.02 ug/mL-FEU — ABNORMAL HIGH (ref 0.00–0.50)

## 2019-06-20 LAB — MAGNESIUM: Magnesium: 2.2 mg/dL (ref 1.7–2.4)

## 2019-06-20 LAB — ECHOCARDIOGRAM COMPLETE

## 2019-06-20 MED ORDER — INSULIN ASPART 100 UNIT/ML ~~LOC~~ SOLN
0.0000 [IU] | Freq: Three times a day (TID) | SUBCUTANEOUS | Status: DC
  Administered 2019-06-21: 1 [IU] via SUBCUTANEOUS
  Administered 2019-06-21 – 2019-06-22 (×3): 3 [IU] via SUBCUTANEOUS
  Administered 2019-06-22: 1 [IU] via SUBCUTANEOUS
  Administered 2019-06-22: 3 [IU] via SUBCUTANEOUS
  Administered 2019-06-23: 1 [IU] via SUBCUTANEOUS
  Administered 2019-06-23: 3 [IU] via SUBCUTANEOUS
  Administered 2019-06-23: 1 [IU] via SUBCUTANEOUS
  Administered 2019-06-24 (×2): 3 [IU] via SUBCUTANEOUS
  Administered 2019-06-25: 5 [IU] via SUBCUTANEOUS
  Administered 2019-06-25: 3 [IU] via SUBCUTANEOUS

## 2019-06-20 MED ORDER — GUAIFENESIN ER 600 MG PO TB12
1200.0000 mg | ORAL_TABLET | Freq: Two times a day (BID) | ORAL | Status: DC
  Administered 2019-06-20 – 2019-06-25 (×6): 1200 mg via ORAL
  Filled 2019-06-20 (×7): qty 2

## 2019-06-20 MED ORDER — METHYLPREDNISOLONE SODIUM SUCC 40 MG IJ SOLR
40.0000 mg | Freq: Three times a day (TID) | INTRAMUSCULAR | Status: DC
  Administered 2019-06-20 – 2019-06-22 (×7): 40 mg via INTRAVENOUS
  Filled 2019-06-20 (×7): qty 1

## 2019-06-20 MED ORDER — FUROSEMIDE 10 MG/ML IJ SOLN
40.0000 mg | Freq: Every day | INTRAMUSCULAR | Status: DC
  Administered 2019-06-20: 40 mg via INTRAVENOUS
  Filled 2019-06-20: qty 4

## 2019-06-20 MED ORDER — PANTOPRAZOLE SODIUM 40 MG PO TBEC
40.0000 mg | DELAYED_RELEASE_TABLET | Freq: Every day | ORAL | Status: DC
  Administered 2019-06-20 – 2019-06-25 (×6): 40 mg via ORAL
  Filled 2019-06-20 (×6): qty 1

## 2019-06-20 NOTE — Progress Notes (Signed)
Patient comfortable in bed, had just recently walked the hallways with assistance and remained on 2 liters [place] without desatting. Patient with no resp distress noted and no complaints. Offered to call his wife to provide an evening update- patient declined stating that the doctor had a long conversation with her earlier and his day nurse also called her. She is Covid+ as well and he would like for her to get some rest tonight. He has been in constant contact with her via his phone. Patient appreciative for the offer to call. Will continue to monitor.

## 2019-06-20 NOTE — Progress Notes (Signed)
  Echocardiogram 2D Echocardiogram has been performed.  [name] 06/20/2019, 1:21 PM

## 2019-06-20 NOTE — TOC Initial Note (Signed)
Transition of Care [facility]) - Initial/Assessment Note    Patient Details  Name: [name] MRN: [id number] Date of Birth: March 29, 1941  [facility] CM/SW Contact:    Carles Collet, RN Phone Number: 06/20/2019, 8:55 AM  Clinical Narrative:             Patient from home w wife. [place] 10/25 with Home Oxygen through [facility].  Patient is identified as a high risk for readmission and will be followed by the [facility] team for DC planning. CM and CSW can be reached through secure chat via care teams or through [place].          Expected Discharge Plan: [place] Barriers to Discharge: Continued Medical Work up   Patient Goals and CMS Choice        Expected Discharge Plan and Services Expected Discharge Plan: [place]                                              Prior Living Arrangements/Services                       Activities of Daily Living Home Assistive Devices/Equipment: None ADL Screening (condition at time of admission) Patient's cognitive ability adequate to safely complete daily activities?: Yes Is the patient deaf or have difficulty hearing?: No Does the patient have difficulty seeing, even when wearing glasses/contacts?: No Does the patient have difficulty concentrating, remembering, or making decisions?: No Patient able to express need for assistance with ADLs?: Yes Does the patient have difficulty dressing or bathing?: No Independently performs ADLs?: Yes (appropriate for developmental age) Does the patient have difficulty walking or climbing stairs?: No Weakness of Legs: None Weakness of Arms/Hands: None  Permission Sought/Granted                  Emotional Assessment              Admission diagnosis:  SOB (shortness of breath) [R06.02] 2019 novel coronavirus disease (COVID-19) [U07.1] Patient Active Problem List   Diagnosis Date Noted  . Anxiety 06/04/2019  . Pneumonia due to  COVID-19 virus 06/02/2019  . Acute respiratory failure due to COVID-19 ([place]) 06/02/2019  . Hand swelling 12/07/2018  . Chronic right shoulder pain 09/10/2018  . Fall 03/08/2018  . Cervical stenosis of spinal canal 06/05/2017  . Pedal edema 06/05/2017  . Health maintenance examination 12/06/2016  . 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 Overweight (BMI 25.0-29.9) 07/04/2016  . Low vitamin B12 level 01/01/2016  . Medicare annual wellness visit, subsequent 07/03/2015  . Advanced care planning/counseling discussion 07/03/2015  . Prediabetes 05/04/2015  . Angina pectoris ([name]) 05/04/2015  . Ex-smoker 05/04/2015  . 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 Spermatocele 04/02/2013  . Syncopal vertigo 11/04/2010  . Lumbar disc disease with radiculopathy 11/04/2010  . Inguinal hernia 05/26/2010  . 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 PVD (peripheral vascular disease) with claudication ([place]) 08/11/2009  . 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 Carotid stenosis 07/09/2008  . 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 Essential hypertension 04/30/2007  . GERD 04/30/2007  . Hyperlipidemia 04/26/2007  . OA (osteoarthritis) of knee 04/26/2007  . DVT, HX OF 04/26/2007   PCP:  Ria Bush, MD Pharmacy:   [facility]  [place], [place] [address] Phone: [phone] Fax: [phone]     Social Determinants of Health (SDOH) Interventions    Readmission Risk Interventions No flowsheet data found.

## 2019-06-20 NOTE — Plan of Care (Signed)
  Problem: Respiratory: Goal: Will maintain a patent airway Outcome: Progressing Goal: Complications related to the disease process, condition or treatment will be avoided or minimized Outcome: Progressing   

## 2019-06-20 NOTE — Progress Notes (Addendum)
PROGRESS NOTE    [name]  [id number] DOB: 1941-07-20 DOA: 06/18/2019 PCP: Ria Bush, MD    Brief Narrative:  78 y.o. male with medical history significant of CAD s/p CABG 1990, PVD, prediabetes, OSA not on CPAP, HTN and HLD who was admitted at [facility] 10/15 with covid-19 pneumonia causing hypoxic respiratory failure after initially testing positive in presurgical screening 10/5 story of OSA does not tolerate CPAP, presented with shortness of breath, with increased oxygen requirement 4 to 2 L nasal cannula, fever 101.3, was transferred to [facility] for further management.   Subjective: Report dyspnea has improved, still present mainly on exertion, he is afebrile overnight, denies any chest pain.  Assessment & Plan:   Active Problems:   Hyperlipidemia   Essential hypertension   Carotid stenosis   PVD (peripheral vascular disease) with claudication (HCC)   GERD   Pneumonia due to COVID-19 virus  Acute hypoxic respiratory failure -With recent COVID-19 infection, discharged on 2 L nasal cannula, presents with increased oxygen requirement, this does appear to be multifactorial, secondary to mild COPD flare, and evidence of volume overload, and not fully recovered from COVID-19 infection.  -Encouraged to use incentive spirometry, flutter valve, to get out of bed to chair.  COVID-19 pneumonia -Still evident on CT chest, as well his CRP still elevated, will continue with IV steroids. -Continue to trend inflammatory markers . COVID-19 Labs  Recent Labs    06/18/19 2150 06/19/19 1345 06/20/19 0550  DDIMER 1.52* 1.55* 1.02*  FERRITIN 890* 972* 809*  LDH 202*  --   --   CRP 14.2* 11.4* 7.0*    Lab Results  Component Value Date   SARSCOV2NAA POSITIVE (A) 05/31/2019   SARSCOV2NAA POSITIVE (A) 05/20/2019   Volume overload -Things of volume overload on physical exam with crackles, leg edema, elevated BNP, will start on Lasix and obtain 2D echo  COPD  exacerbation -Change IV Decadron to IV Solu-Medrol  Diabetes mellitus -Continue with insulin sliding scale  Hypertension -Pressure acceptable, continue with Imdur  Hyperlipidemia -Continue with statin  CAD -Denies any chest pain  GERD -Continue with PPI  DVT prophylaxis: Lovenox subq Code Status: DNR Family Communication: Discussed with wife via phone Disposition Plan: Home once stable  Consultants:     Procedures:     Antimicrobials: Anti-infectives (From admission, onward)   None       Objective: Vitals:   06/19/19 1946 06/19/19 2352 06/20/19 0400 06/20/19 0812  BP: (!) 129/48 (!) 117/46 (!) 130/52 136/61  Pulse:  69 70 (!) 59  Resp:  17 18 15   Temp: 97.7 F (36.5 C) 97.8 F (36.6 C) 98.2 F (36.8 C) 97.8 F (36.6 C)  TempSrc: Oral Oral Oral Oral  SpO2:  94% 93% 94%    Intake/Output Summary (Last 24 hours) at 06/20/2019 1119 Last data filed at 06/20/2019 0930 Gross per 24 hour  Intake 493 ml  Output 1375 ml  Net -882 ml   There were no vitals filed for this visit.  Examination:  Awake Alert, Oriented X 3, No new F.N deficits, Normal affect Symmetrical Chest wall movement, Good air movement bilaterally, bibasilar crackles RRR,No Gallops,Rubs or new Murmurs, No Parasternal Heave +ve B.Sounds, Abd Soft, No tenderness, No rebound - guarding or rigidity. No Cyanosis, Clubbing ,+1 edema, No new Rash or bruise     Data Reviewed: I have personally reviewed following labs and imaging studies  CBC: Recent Labs  Lab 06/18/19 1343 06/18/19 2229 06/19/19 1345 06/20/19 0550  WBC 5.4  --  6.4 6.3  NEUTROABS 4.2  --  5.9 5.3  HGB 10.9* 16.3 10.8* 10.1*  HCT 33.9* 48.0 33.3* 31.1*  MCV 98.3  --  96.2 97.2  PLT 155  --  142* [id number]*   Basic Metabolic Panel: Recent Labs  Lab 06/18/19 1343 06/18/19 2229 06/19/19 1345 06/20/19 0550  NA 141 138 138 140  K 4.4 4.4 4.5 4.1  CL 102  --  104 104  CO2 26  --  25 26  GLUCOSE 105*  --  246* 138*  BUN  21  --  26* 28*  CREATININE 1.10  --  1.13 1.05  CALCIUM 8.8*  --  8.3* 8.4*  MG  --   --   --  2.2   GFR: Estimated Creatinine Clearance: 63.6 mL/min (by C-G formula based on SCr of 1.05 mg/dL). Liver Function Tests: Recent Labs  Lab 06/18/19 1343 06/19/19 1345 06/20/19 0550  AST 25 22 19   ALT 33 27 27  ALKPHOS 55 52 47  BILITOT 0.6 0.8 0.6  PROT 6.3* 6.6 5.9*  ALBUMIN 2.2* 2.5* 2.4*   No results for input(s): LIPASE, AMYLASE in the last 168 hours. No results for input(s): AMMONIA in the last 168 hours. Coagulation Profile: No results for input(s): INR, PROTIME in the last 168 hours. Cardiac Enzymes: No results for input(s): CKTOTAL, CKMB, CKMBINDEX, TROPONINI in the last 168 hours. BNP (last 3 results) No results for input(s): PROBNP in the last 8760 hours. HbA1C: Recent Labs    06/19/19 1345  HGBA1C 7.4*   CBG: Recent Labs  Lab 06/19/19 1601 06/19/19 1944 06/20/19 0026 06/20/19 0422 06/20/19 0809  GLUCAP 207* 313* 104* 142* 119*   Lipid Profile: Recent Labs    06/18/19 2150  TRIG 78   Thyroid Function Tests: No results for input(s): TSH, T4TOTAL, FREET4, T3FREE, THYROIDAB in the last 72 hours. Anemia Panel: Recent Labs    06/19/19 1345 06/20/19 0550  FERRITIN 972* 809*   Sepsis Labs: Recent Labs  Lab 06/18/19 2150 06/19/19 1345 06/20/19 0550  PROCALCITON <0.10 <0.10 <0.10  LATICACIDVEN 1.2 2.7*  --     Recent Results (from the past 240 hour(s))  Blood culture (routine x 2)     Status: None (Preliminary result)   Collection Time: 06/18/19  3:28 PM   Specimen: BLOOD  Result Value Ref Range Status   Specimen Description BLOOD BLOOD LEFT FOREARM  Final   Special Requests   Final    BOTTLES DRAWN AEROBIC AND ANAEROBIC Blood Culture results may not be optimal due to an inadequate volume of blood received in culture bottles   Culture   Final    NO GROWTH < 24 HOURS Performed at [facility], [place] [address] [place], [place] [address]      Report Status PENDING  Incomplete  Blood culture (routine x 2)     Status: None (Preliminary result)   Collection Time: 06/18/19 10:19 PM   Specimen: BLOOD  Result Value Ref Range Status   Specimen Description BLOOD LEFT ANTECUBITAL  Final   Special Requests   Final    BOTTLES DRAWN AEROBIC AND ANAEROBIC Blood Culture adequate volume   Culture   Final    NO GROWTH < 24 HOURS Performed at [facility], [place] [address] [place], [place] [address]    Report Status PENDING  Incomplete     Radiology Studies: Ct Angio Chest Pe W/cm &/or Wo Cm  Result Date:  06/18/2019 CLINICAL DATA:  COVID-19 positive, worsening shortness of breath and increasing O2 requirement, EXAM: CT ANGIOGRAPHY CHEST WITH CONTRAST TECHNIQUE: Multidetector CT imaging of the chest was performed using the standard protocol during bolus administration of intravenous contrast. Multiplanar CT image reconstructions and MIPs were obtained to evaluate the vascular anatomy. CONTRAST:  54mL OMNIPAQUE IOHEXOL 350 MG/ML SOLN COMPARISON:  Radiograph 06/18/2019, CT 07/03/2013 FINDINGS: Cardiovascular: Evaluation of the lungs beyond the lobar level is limited due to respiratory motion artifact. Satisfactory opacification of the pulmonary arteries without central or lobar filling defect. No pulmonary artery filling defects are identified. Central pulmonary arteries are borderline enlarged but without elevation of the RV/LV ratio (0.8). Patient is post CABG with mediastinal surgical clips and postsurgical changes in the anterior mediastinum. Calcification of the native coronary arteries is noted. No pericardial effusion. The normal caliber thoracic aorta and proximal great vessels are calcified. No acute luminal irregularity is seen. No periaortic stranding. Mediastinum/Nodes: Low-attenuation mediastinal and hilar lymph nodes are nonenlarged but likely reactive. No axillary adenopathy. Thyroid gland and thoracic inlet are unremarkable. No  acute abnormality of the trachea or esophagus. Lungs/Pleura: Mild central airways thickening. Diffuse multifocal areas of consolidation and ground-glass opacity throughout the lungs compatible with atypical infection in the setting of known COVID-19 positivity. Small bilateral effusions are present. No abnormal pleural enhancement or thickening to suggest developing empyema at this time. No pneumothorax. Upper Abdomen: Stable 16 mm hypoattenuating structure in the left lobe liver, unchanged since 2014. Likely benign cyst or hemangioma. No acute abnormalities present in the visualized portions of the upper abdomen. Musculoskeletal: Multiple intact sternal sutures are noted. There is bony fusion across the sternotomy site. Multilevel degenerative changes are present in the imaged portions of the spine. No acute osseous abnormality or suspicious osseous lesion. No suspicious chest wall lesion. Review of the MIP images confirms the above findings. IMPRESSION: 1. No evidence of central or lobar pulmonary embolism. Evaluation of the lungs beyond the lobar level is limited due to respiratory motion artifact. 2. Diffuse multifocal areas of consolidation and ground-glass opacity throughout the lungs compatible with atypical infection in the setting of known COVID positivity. 3. Small bilateral effusions. No abnormal pleural enhancement or thickening to suggest developing empyema at this time. 4. Stable 16 mm hypoattenuating focus in the left lobe liver, favor benign cyst or hemangioma. 5.  Aortic Atherosclerosis (ICD10-I70.0). 6. Prior CABG. Electronically Signed   By: Lovena Le M.D.   On: 06/18/2019 22:26   Dg Chest Port 1 View  Result Date: 06/18/2019 CLINICAL DATA:  Shortness of breath. EXAM: PORTABLE CHEST 1 VIEW COMPARISON:  06/02/2019. FINDINGS: Prior CABG. Cardiomegaly. No pulmonary venous congestion. Multifocal bilateral pulmonary infiltrates with slight improvement from prior exam. No pleural effusion or  pneumothorax IMPRESSION: 1. Multifocal bilateral pulmonary infiltrates with slight improvement from prior exam. 2.  Prior CABG.  Cardiomegaly.  No pulmonary venous congestion. Electronically Signed   By: Marcello Moores  Register   On: 06/18/2019 14:03    Scheduled Meds:  enoxaparin (LOVENOX) injection  40 mg Subcutaneous Q24H   ezetimibe  10 mg Oral QHS   fesoterodine  4 mg Oral Daily   gabapentin  300 mg Oral TID   guaiFENesin  1,200 mg Oral BID   insulin aspart  0-9 Units Subcutaneous Q4H   isosorbide mononitrate  30 mg Oral Daily   methylPREDNISolone (SOLU-MEDROL) injection  40 mg Intravenous Q8H   pantoprazole  40 mg Oral Daily   simvastatin  40 mg Oral QHS  sodium chloride flush  3 mL Intravenous Q12H   sodium chloride flush  3 mL Intravenous Q12H   Continuous Infusions:  sodium chloride       LOS: 1 day   Phillips Climes, MD [facility] Pager On Amion  If 7PM-7AM, please contact night-coverage 06/20/2019, 11:19 AM

## 2019-06-21 DIAGNOSIS — I5041 Acute combined systolic (congestive) and diastolic (congestive) heart failure: Secondary | ICD-10-CM

## 2019-06-21 LAB — COMPREHENSIVE METABOLIC PANEL
ALT: 35 U/L (ref 0–44)
AST: 23 U/L (ref 15–41)
Albumin: 2.5 g/dL — ABNORMAL LOW (ref 3.5–5.0)
Alkaline Phosphatase: 45 U/L (ref 38–126)
Anion gap: 11 (ref 5–15)
BUN: 32 mg/dL — ABNORMAL HIGH (ref 8–23)
CO2: 26 mmol/L (ref 22–32)
Calcium: 8.4 mg/dL — ABNORMAL LOW (ref 8.9–10.3)
Chloride: 102 mmol/L (ref 98–111)
Creatinine, Ser: 1.2 mg/dL (ref 0.61–1.24)
GFR calc Af Amer: >60 mL/min (ref >60)
GFR calc non Af Amer: 58 mL/min — ABNORMAL LOW (ref >60)
Glucose, Bld: 153 mg/dL — ABNORMAL HIGH (ref 70–99)
Potassium: 4.3 mmol/L (ref 3.5–5.1)
Sodium: 139 mmol/L (ref 135–145)
Total Bilirubin: 0.3 mg/dL (ref 0.3–1.2)
Total Protein: 6.2 g/dL — ABNORMAL LOW (ref 6.5–8.1)

## 2019-06-21 LAB — PROCALCITONIN: Procalcitonin: 0.12 ng/mL

## 2019-06-21 LAB — D-DIMER, QUANTITATIVE: D-Dimer, Quant: 0.92 ug/mL-FEU — ABNORMAL HIGH (ref 0.00–0.50)

## 2019-06-21 LAB — CBC WITH DIFFERENTIAL/PLATELET
Abs Immature Granulocytes: 0.03 10*3/uL (ref 0.00–0.07)
Basophils Absolute: 0 10*3/uL (ref 0.0–0.1)
Basophils Relative: 0 %
Eosinophils Absolute: 0 10*3/uL (ref 0.0–0.5)
Eosinophils Relative: 0 %
HCT: 31 % — ABNORMAL LOW (ref 39.0–52.0)
Hemoglobin: 10 g/dL — ABNORMAL LOW (ref 13.0–17.0)
Immature Granulocytes: 0 %
Lymphocytes Relative: 8 %
Lymphs Abs: 0.6 10*3/uL — ABNORMAL LOW (ref 0.7–4.0)
MCH: 31.2 pg (ref 26.0–34.0)
MCHC: 32.3 g/dL (ref 30.0–36.0)
MCV: 96.6 fL (ref 80.0–100.0)
Monocytes Absolute: 0.1 10*3/uL (ref 0.1–1.0)
Monocytes Relative: 1 %
Neutro Abs: 6.4 10*3/uL (ref 1.7–7.7)
Neutrophils Relative %: 91 %
Platelets: 128 10*3/uL — ABNORMAL LOW (ref 150–400)
RBC: 3.21 MIL/uL — ABNORMAL LOW (ref 4.22–5.81)
RDW: 12.2 % (ref 11.5–15.5)
WBC: 7.1 10*3/uL (ref 4.0–10.5)
nRBC: 0 % (ref 0.0–0.2)

## 2019-06-21 LAB — GLUCOSE, CAPILLARY
Glucose-Capillary: 278 mg/dL — ABNORMAL HIGH (ref 70–99)
Glucose-Capillary: 150 mg/dL — ABNORMAL HIGH (ref 70–99)
Glucose-Capillary: 220 mg/dL — ABNORMAL HIGH (ref 70–99)
Glucose-Capillary: 204 mg/dL — ABNORMAL HIGH (ref 70–99)

## 2019-06-21 LAB — FERRITIN: Ferritin: 722 ng/mL — ABNORMAL HIGH (ref 24–336)

## 2019-06-21 LAB — MAGNESIUM: Magnesium: 2.2 mg/dL (ref 1.7–2.4)

## 2019-06-21 LAB — C-REACTIVE PROTEIN: CRP: 4.5 mg/dL — ABNORMAL HIGH (ref <1.0)

## 2019-06-21 MED ORDER — FUROSEMIDE 10 MG/ML IJ SOLN
20.0000 mg | Freq: Every day | INTRAMUSCULAR | Status: DC
  Administered 2019-06-21 – 2019-06-23 (×3): 20 mg via INTRAVENOUS
  Filled 2019-06-21 (×3): qty 2

## 2019-06-21 MED ORDER — METOPROLOL TARTRATE 25 MG PO TABS
12.5000 mg | ORAL_TABLET | Freq: Two times a day (BID) | ORAL | Status: DC
  Administered 2019-06-21 (×2): 12.5 mg via ORAL
  Filled 2019-06-21 (×2): qty 1

## 2019-06-21 MED ORDER — LOSARTAN POTASSIUM 25 MG PO TABS
25.0000 mg | ORAL_TABLET | Freq: Every day | ORAL | Status: DC
  Administered 2019-06-21 – 2019-06-25 (×5): 25 mg via ORAL
  Filled 2019-06-21 (×5): qty 1

## 2019-06-21 MED ORDER — ASPIRIN EC 81 MG PO TBEC
81.0000 mg | DELAYED_RELEASE_TABLET | Freq: Every day | ORAL | Status: DC
  Administered 2019-06-21 – 2019-06-25 (×4): 81 mg via ORAL
  Filled 2019-06-21 (×4): qty 1

## 2019-06-21 NOTE — Plan of Care (Signed)
  Problem: Respiratory: Goal: Will maintain a patent airway Outcome: Progressing Goal: Complications related to the disease process, condition or treatment will be avoided or minimized Outcome: Progressing   

## 2019-06-21 NOTE — Progress Notes (Signed)
PROGRESS NOTE    [name]  [id number] DOB: [birth year] DOA: 06/18/2019 PCP: Ria Bush, MD    Brief Narrative:  78 y.o. male with medical history significant of CAD s/p CABG 1990, PVD, prediabetes, OSA not on CPAP, HTN and HLD who was admitted at [facility] 10/15 with covid-19 pneumonia causing hypoxic respiratory failure after initially testing positive in presurgical screening 10/5 story of OSA does not tolerate CPAP, presented with shortness of breath, with increased oxygen requirement 4 to 2 L nasal cannula, fever 101.3, was transferred to [facility] for further management.   Subjective: Is feeling better today, dyspnea mainly on exertion, no chest pain, no fever or chills.  Assessment & Plan:   Active Problems:   Hyperlipidemia   Essential hypertension   Carotid stenosis   PVD (peripheral vascular disease) with claudication (HCC)   GERD   Pneumonia due to COVID-19 virus  Acute hypoxic respiratory failure -With recent COVID-19 infection, discharged on 2 L nasal cannula, presents with increased oxygen requirement, this does appear to be multifactorial, secondary to mild COPD flare, and evidence of volume overload, and not fully recovered from COVID-19 infection.  -Encouraged to use incentive spirometry, flutter valve, to get out of bed to chair.  COVID-19 pneumonia -Still evident on CT chest, as well his CRP still elevated, will continue with IV steroids. -Continue to trend inflammatory markers, it is trending down which is reassuring . COVID-19 Labs  Recent Labs    06/18/19 2150 06/19/19 1345 06/20/19 0550 06/21/19 0152  DDIMER 1.52* 1.55* 1.02* 0.92*  FERRITIN 890* 972* 809* 722*  LDH 202*  --   --   --   CRP 14.2* 11.4* 7.0* 4.5*    Lab Results  Component Value Date   SARSCOV2NAA POSITIVE (A) 05/31/2019   SARSCOV2NAA POSITIVE (A) 05/20/2019   Acute on chronic systolic/diastolic CHF -2D echo showing evidence of grade 1 diastolic  dysfunction, continue with IV Lasix, monitor renal function closely, so far he is -2.4 L. -We will start on low-dose metoprolol and losartan.  COPD exacerbation -Change IV Decadron to IV Solu-Medrol  Diabetes mellitus -Continue with insulin sliding scale  Hypertension -Pressure acceptable, continue with Imdur, I have started low dose metoprolol and losartan in the setting of his CHF  Hyperlipidemia -Continue with statin  CAD -Denies any chest pain, please see above discussion under CHF, resume on aspirin  GERD -Continue with PPI  DVT prophylaxis: Lovenox subq Code Status: DNR Family Communication: Will update wife via phone today Disposition Plan: Home once stable  Consultants:     Procedures:     Antimicrobials: Anti-infectives (From admission, onward)   None       Objective: Vitals:   06/21/19 0337 06/21/19 0400 06/21/19 0500 06/21/19 0727  BP: (!) 132/52   (!) 146/60  Pulse: 66 67 62 67  Resp: 17 14 16 17   Temp: 98.3 F (36.8 C)   97.9 F (36.6 C)  TempSrc: Oral   Oral  SpO2: 98% 96% 97% 93%  Weight:      Height:        Intake/Output Summary (Last 24 hours) at 06/21/2019 1200 Last data filed at 06/21/2019 0849 Gross per 24 hour  Intake 123 ml  Output 2100 ml  Net -1977 ml   Filed Weights   06/20/19 2300  Weight: 92.8 kg    Examination:  Awake Alert, Oriented X 3, No new F.N deficits, Normal affect Symmetrical Chest wall movement, Good air movement bilaterally, normal crackles RRR,No  Gallops,Rubs or new Murmurs, No Parasternal Heave +ve B.Sounds, Abd Soft, No tenderness, No rebound - guarding or rigidity. No Cyanosis, Clubbing, trace edema, No new Rash or bruise      Data Reviewed: I have personally reviewed following labs and imaging studies  CBC: Recent Labs  Lab 06/18/19 1343 06/18/19 2229 06/19/19 1345 06/20/19 0550 06/21/19 0152  WBC 5.4  --  6.4 6.3 7.1  NEUTROABS 4.2  --  5.9 5.3 6.4  HGB 10.9* 16.3 10.8* 10.1* 10.0*   HCT 33.9* 48.0 33.3* 31.1* 31.0*  MCV 98.3  --  96.2 97.2 96.6  PLT 155  --  142* 132* 0000000*   Basic Metabolic Panel: Recent Labs  Lab 06/18/19 1343 06/18/19 2229 06/19/19 1345 06/20/19 0550 06/21/19 0152  NA 141 138 138 140 139  K 4.4 4.4 4.5 4.1 4.3  CL 102  --  104 104 102  CO2 26  --  25 26 26   GLUCOSE 105*  --  246* 138* 153*  BUN 21  --  26* 28* 32*  CREATININE 1.10  --  1.13 1.05 1.20  CALCIUM 8.8*  --  8.3* 8.4* 8.4*  MG  --   --   --  2.2 2.2   GFR: Estimated Creatinine Clearance: 55.7 mL/min (by C-G formula based on SCr of 1.2 mg/dL). Liver Function Tests: Recent Labs  Lab 06/18/19 1343 06/19/19 1345 06/20/19 0550 06/21/19 0152  AST 25 22 19 23   ALT 33 27 27 35  ALKPHOS 55 52 47 45  BILITOT 0.6 0.8 0.6 0.3  PROT 6.3* 6.6 5.9* 6.2*  ALBUMIN 2.2* 2.5* 2.4* 2.5*   No results for input(s): LIPASE, AMYLASE in the last 168 hours. No results for input(s): AMMONIA in the last 168 hours. Coagulation Profile: No results for input(s): INR, PROTIME in the last 168 hours. Cardiac Enzymes: No results for input(s): CKTOTAL, CKMB, CKMBINDEX, TROPONINI in the last 168 hours. BNP (last 3 results) No results for input(s): PROBNP in the last 8760 hours. HbA1C: Recent Labs    06/19/19 1345  HGBA1C 7.4*   CBG: Recent Labs  Lab 06/20/19 0809 06/20/19 1122 06/20/19 1601 06/20/19 2056 06/21/19 0724  GLUCAP 119* 193* 278* 219* 150*   Lipid Profile: Recent Labs    06/18/19 2150  TRIG 78   Thyroid Function Tests: No results for input(s): TSH, T4TOTAL, FREET4, T3FREE, THYROIDAB in the last 72 hours. Anemia Panel: Recent Labs    06/20/19 0550 06/21/19 0152  FERRITIN 809* 722*   Sepsis Labs: Recent Labs  Lab 06/18/19 2150 06/19/19 1345 06/20/19 0550 06/21/19 0152  PROCALCITON <0.10 <0.10 <0.10 0.12  LATICACIDVEN 1.2 2.7*  --   --     Recent Results (from the past 240 hour(s))  Blood culture (routine x 2)     Status: None (Preliminary result)    Collection Time: 06/18/19  3:28 PM   Specimen: BLOOD  Result Value Ref Range Status   Specimen Description BLOOD BLOOD LEFT FOREARM  Final   Special Requests   Final    BOTTLES DRAWN AEROBIC AND ANAEROBIC Blood Culture results may not be optimal due to an inadequate volume of blood received in culture bottles   Culture   Final    NO GROWTH 2 DAYS Performed at [facility], [place] [address] [place], [place] [address]    Report Status PENDING  Incomplete  Blood culture (routine x 2)     Status: None (Preliminary result)   Collection Time: 06/18/19 10:19  PM   Specimen: BLOOD  Result Value Ref Range Status   Specimen Description BLOOD LEFT ANTECUBITAL  Final   Special Requests   Final    BOTTLES DRAWN AEROBIC AND ANAEROBIC Blood Culture adequate volume   Culture   Final    NO GROWTH 2 DAYS Performed at [facility], [address] [place], [place] [address]    Report Status PENDING  Incomplete     Radiology Studies: No results found.  Scheduled Meds: . enoxaparin (LOVENOX) injection  40 mg Subcutaneous Q24H  . ezetimibe  10 mg Oral QHS  . fesoterodine  4 mg Oral Daily  . furosemide  20 mg Intravenous Daily  . gabapentin  300 mg Oral TID  . guaiFENesin  1,200 mg Oral BID  . insulin aspart  0-9 Units Subcutaneous TID WC  . isosorbide mononitrate  30 mg Oral Daily  . losartan  25 mg Oral Daily  . methylPREDNISolone (SOLU-MEDROL) injection  40 mg Intravenous Q8H  . metoprolol tartrate  12.5 mg Oral BID  . pantoprazole  40 mg Oral Daily  . simvastatin  40 mg Oral QHS  . sodium chloride flush  3 mL Intravenous Q12H  . sodium chloride flush  3 mL Intravenous Q12H   Continuous Infusions: . sodium chloride       LOS: 2 days   Phillips Climes, MD [facility] Pager On Amion  If 7PM-7AM, please contact night-coverage 06/21/2019, 12:00 PM

## 2019-06-21 NOTE — Plan of Care (Signed)
  Problem: Education: Goal: Knowledge of risk factors and measures for prevention of condition will improve Outcome: Progressing   Problem: Coping: Goal: Psychosocial and spiritual needs will be supported Outcome: Progressing   Problem: Respiratory: Goal: Will maintain a patent airway Outcome: Progressing Goal: Complications related to the disease process, condition or treatment will be avoided or minimized Outcome: Progressing   

## 2019-06-22 LAB — CBC WITH DIFFERENTIAL/PLATELET
Abs Immature Granulocytes: 0.04 10*3/uL (ref 0.00–0.07)
Basophils Absolute: 0 10*3/uL (ref 0.0–0.1)
Basophils Relative: 0 %
Eosinophils Absolute: 0 10*3/uL (ref 0.0–0.5)
Eosinophils Relative: 0 %
HCT: 31.5 % — ABNORMAL LOW (ref 39.0–52.0)
Hemoglobin: 10.2 g/dL — ABNORMAL LOW (ref 13.0–17.0)
Immature Granulocytes: 1 %
Lymphocytes Relative: 6 %
Lymphs Abs: 0.5 10*3/uL — ABNORMAL LOW (ref 0.7–4.0)
MCH: 31.2 pg (ref 26.0–34.0)
MCHC: 32.4 g/dL (ref 30.0–36.0)
MCV: 96.3 fL (ref 80.0–100.0)
Monocytes Absolute: 0.3 10*3/uL (ref 0.1–1.0)
Monocytes Relative: 3 %
Neutro Abs: 7.7 10*3/uL (ref 1.7–7.7)
Neutrophils Relative %: 90 %
Platelets: 143 10*3/uL — ABNORMAL LOW (ref 150–400)
RBC: 3.27 MIL/uL — ABNORMAL LOW (ref 4.22–5.81)
RDW: 12.2 % (ref 11.5–15.5)
WBC: 8.6 10*3/uL (ref 4.0–10.5)
nRBC: 0 % (ref 0.0–0.2)

## 2019-06-22 LAB — COMPREHENSIVE METABOLIC PANEL
ALT: 46 U/L — ABNORMAL HIGH (ref 0–44)
AST: 30 U/L (ref 15–41)
Albumin: 2.5 g/dL — ABNORMAL LOW (ref 3.5–5.0)
Alkaline Phosphatase: 50 U/L (ref 38–126)
Anion gap: 11 (ref 5–15)
BUN: 42 mg/dL — ABNORMAL HIGH (ref 8–23)
CO2: 26 mmol/L (ref 22–32)
Calcium: 8.4 mg/dL — ABNORMAL LOW (ref 8.9–10.3)
Chloride: 101 mmol/L (ref 98–111)
Creatinine, Ser: 1.09 mg/dL (ref 0.61–1.24)
GFR calc Af Amer: >60 mL/min (ref >60)
GFR calc non Af Amer: >60 mL/min (ref >60)
Glucose, Bld: 178 mg/dL — ABNORMAL HIGH (ref 70–99)
Potassium: 4.8 mmol/L (ref 3.5–5.1)
Sodium: 138 mmol/L (ref 135–145)
Total Bilirubin: 0.4 mg/dL (ref 0.3–1.2)
Total Protein: 6 g/dL — ABNORMAL LOW (ref 6.5–8.1)

## 2019-06-22 LAB — C-REACTIVE PROTEIN: CRP: 2.7 mg/dL — ABNORMAL HIGH (ref <1.0)

## 2019-06-22 LAB — MAGNESIUM: Magnesium: 2.2 mg/dL (ref 1.7–2.4)

## 2019-06-22 LAB — D-DIMER, QUANTITATIVE: D-Dimer, Quant: 0.81 ug/mL-FEU — ABNORMAL HIGH (ref 0.00–0.50)

## 2019-06-22 LAB — GLUCOSE, CAPILLARY
Glucose-Capillary: 243 mg/dL — ABNORMAL HIGH (ref 70–99)
Glucose-Capillary: 205 mg/dL — ABNORMAL HIGH (ref 70–99)

## 2019-06-22 MED ORDER — METHYLPREDNISOLONE SODIUM SUCC 40 MG IJ SOLR
40.0000 mg | Freq: Every day | INTRAMUSCULAR | Status: DC
  Administered 2019-06-23: 09:00:00 40 mg via INTRAVENOUS
  Filled 2019-06-22: qty 1

## 2019-06-22 NOTE — Progress Notes (Signed)
Pt assisted into the chair this am after getting cleaned up at the sink. Pt independent with grooming and mobility HR maintaining mostly in the 70s NSR. While this RN was on break, pt called staff to the room stating that he felt "swimmy headed" and had seen his HR drop to the 40s. BP obtained and is documented as being within normal limits with the systolic in the 0000000. Pt assisted back to bed and MD oncall notified. Will hold metoprolol at this time. CCM notified to please contact RN if HR sustains in the low 40s. Will continue to monitor.

## 2019-06-22 NOTE — Evaluation (Signed)
Physical Therapy Evaluation Patient Details Name: [name] MRN: [id number] DOB: [birth year] Today's Date: 06/22/2019   History of Present Illness  78 y/o male pt w/ hx of lumbar spinal stenosis, PSA, PNA, PAD, OA, OSA, MI, lumbar disc disease w/ radiculopathy, HTN, HLD, GERD, DVT, CAD, s/p CABG. BPH, COVID 19 (10/15). R TKA, B THA, Lumbar lami, cervical spine surgery, cardiac cath. Recently admitted to hospital w/ [facility] and d/c home (10/15-10/26).  Clinical Impression   Pt admitted with above diagnosis. PTA was home with spouse and very independent, was admitted to hospital with [place] in October and d/c home. Pt currently with functional limitations due to the deficits listed below (see PT Problem List). Pt does well with mobility during assessment he is at SBA w/ cues, able to ambulate approx 321ft with RW and SBA. Pt remains on 2L/min via [place] and was able to maintain sats in high 90s, noted HR max 107 w/ ambulation.  Pt will benefit from skilled PT to increase their independence and safety with mobility to allow discharge to the venue listed below.       Follow Up Recommendations No PT follow up    Equipment Recommendations  Rolling walker with 5" wheels    Recommendations for Other Services OT consult     Precautions / Restrictions Precautions Precautions: Fall Restrictions Weight Bearing Restrictions: No      Mobility  Bed Mobility               General bed mobility comments: Pt received sitting in recliner  Transfers Overall transfer level: Needs assistance   Transfers: Sit to/from Stand Sit to Stand: Supervision            Ambulation/Gait Ambulation/Gait assistance: Supervision Gait Distance (Feet): 300 Feet Assistive device: Rolling walker (2 wheeled) Gait Pattern/deviations: Step-through pattern     General Gait Details: states needs RW for safety, but usually does not use AD at home  Stairs            Wheelchair Mobility    Modified  [place] (Stroke Patients Only)       Balance Overall balance assessment: Needs assistance Sitting-balance support: Feet unsupported Sitting balance-Leahy Scale: Good     Standing balance support: During functional activity;Bilateral upper extremity supported Standing balance-Leahy Scale: Good                               Pertinent Vitals/Pain Pain Assessment: No/denies pain    Home Living Family/patient expects to be discharged to:: Private residence Living Arrangements: Spouse/significant other Available Help at Discharge: Family Type of Home: House Home Access: Stairs to enter Entrance Stairs-Rails: Can reach both Entrance Stairs-Number of Steps: 5 Home Layout: One level Home Equipment: Shower seat - built in;Walker - 2 wheels;Bedside commode      Prior Function Level of Independence: Independent               Hand Dominance        Extremity/Trunk Assessment   Upper Extremity Assessment Upper Extremity Assessment: Defer to OT evaluation    Lower Extremity Assessment Lower Extremity Assessment: Overall WFL for tasks assessed       Communication   Communication: No difficulties  Cognition Arousal/Alertness: Awake/alert Behavior During Therapy: WFL for tasks assessed/performed Overall Cognitive Status: Within Functional Limits for tasks assessed  General Comments      Exercises     Assessment/Plan    PT Assessment Patient needs continued PT services  PT Problem List Decreased activity tolerance;Decreased safety awareness;Decreased mobility       PT Treatment Interventions Gait training;Functional mobility training;Therapeutic activities;Therapeutic exercise;Balance training;Neuromuscular re-education;Patient/family education    PT Goals (Current goals can be found in the Care Plan section)  Acute Rehab PT Goals Patient Stated Goal: get better Time For Goal Achievement:  07/06/19 Potential to Achieve Goals: Good    Frequency Min 3X/week   Barriers to discharge Other (comment) failed d/c home     Co-evaluation               AM-PAC PT "6 Clicks" Mobility  Outcome Measure Help needed turning from your back to your side while in a flat bed without using bedrails?: None Help needed moving from lying on your back to sitting on the side of a flat bed without using bedrails?: A Little Help needed moving to and from a bed to a chair (including a wheelchair)?: None Help needed standing up from a chair using your arms (e.g., wheelchair or bedside chair)?: None Help needed to walk in hospital room?: A Little Help needed climbing 3-5 steps with a railing? : A Little 6 Click Score: 21    End of Session Equipment Utilized During Treatment: Oxygen Activity Tolerance: Patient tolerated treatment well Patient left: in chair;with call bell/phone within reach Nurse Communication: Mobility status PT Visit Diagnosis: Other abnormalities of gait and mobility (R26.89);Muscle weakness (generalized) (M62.81)    Time: [id number] PT Time Calculation (min) (ACUTE ONLY): 20 min   Charges:   PT Evaluation $PT Eval Low Complexity: [place], PT   Delford Field 06/22/2019, 3:57 PM

## 2019-06-22 NOTE — Progress Notes (Signed)
PROGRESS NOTE    [name]  [id number] DOB: March 03, 1941 DOA: 06/18/2019 PCP: Ria Bush, MD    Brief Narrative:  78 y.o. male with medical history significant of CAD s/p CABG 1990, PVD, prediabetes, OSA not on CPAP, HTN and HLD who was admitted at [facility] 10/15 with covid-19 pneumonia causing hypoxic respiratory failure after initially testing positive in presurgical screening 10/5 story of OSA does not tolerate CPAP, presented with shortness of breath, with increased oxygen requirement 4 to 2 L nasal cannula, fever 101.3, was transferred to [facility] for further management.   Subjective: She has had some dizziness earlier today sitting on the chair, heart rate was in the mid 40s on the monitor, he denies any chest pain, reports dyspnea has improved.  Assessment & Plan:   Active Problems:   Hyperlipidemia   Essential hypertension   Carotid stenosis   PVD (peripheral vascular disease) with claudication (HCC)   GERD   Pneumonia due to COVID-19 virus  Acute hypoxic respiratory failure -With recent COVID-19 infection, discharged on 2 L nasal cannula, presents with increased oxygen requirement, this does appear to be multifactorial, secondary to mild COPD flare, and evidence of volume overload, and not fully recovered from COVID-19 infection.  -Encouraged to use incentive spirometry, flutter valve, to get out of bed to chair.  COVID-19 pneumonia -Still evident on CT chest, as well his CRP still limit currently elevated on admission, so he is back on IV steroids. elevated, -Continue to trend inflammatory markers, it is trending down which is reassuring . COVID-19 Labs  Recent Labs    06/19/19 1345 06/20/19 0550 06/21/19 0152 06/22/19 0003  DDIMER 1.55* 1.02* 0.92* 0.81*  FERRITIN 972* 809* 722*  --   CRP 11.4* 7.0* 4.5* 2.7*    Lab Results  Component Value Date   SARSCOV2NAA POSITIVE (A) 05/31/2019   SARSCOV2NAA POSITIVE (A) 05/20/2019   Acute  on chronic systolic/diastolic CHF -2D echo showing evidence of grade 1 diastolic dysfunction, patient is diuresing appropriately on current dose of Lasix, he is 4.1 L negative balance so far . -Started on metoprolol, which has been discontinued given significant symptomatic bradycardia . -Continue with losartan, potassium is 4.8 today, will monitor closely, will need to discontinue if potassium keep trending up . -Continue with Imdur, if there is room in blood pressure will add hydralazine . -remains  with significant lower extremity edema, instructed him to fluid restrict 1.8 L/day obtain venous Doppler as well  COPD exacerbation -No further wheezing today, I have decreased his Solu-Medrol to 40 mg IV daily  Diabetes mellitus -Continue with insulin sliding scale  Hypertension -Pressure acceptable, continue with Imdur, Toprol has been stopped given bradycardia, to need to monitor closely as he was started on losartan as well.  Hyperlipidemia -Continue with statin  CAD -Denies any chest pain, please see above discussion under CHF, resume on aspirin  GERD -Continue with PPI  DVT prophylaxis: Lovenox subq Code Status: DNR Family Communication: Wife updated daily Disposition Plan: Home once stable  Consultants:     Procedures:     Antimicrobials: Anti-infectives (From admission, onward)   None       Objective: Vitals:   06/21/19 1928 06/22/19 0014 06/22/19 0452 06/22/19 0741  BP: (!) 144/58 (!) 132/56 (!) 143/67 (!) 166/61  Pulse: 81 61 (!) 58 (!) 57  Resp: (!) 31 17 20  (!) 21  Temp: 98 F (36.7 C) 99 F (37.2 C) (!) 97.2 F (36.2 C) 99.7 F (37.6 C)  TempSrc: Oral Oral Oral Oral  SpO2: 96% 96% 98% 98%  Weight:      Height:        Intake/Output Summary (Last 24 hours) at 06/22/2019 1125 Last data filed at 06/22/2019 1125 Gross per 24 hour  Intake 360 ml  Output 2425 ml  Net -2065 ml   Filed Weights   06/20/19 2300  Weight: 92.8 kg    Examination:   Awake Alert, Oriented X 3, No new F.N deficits, Normal affect Symmetrical Chest wall movement, Good air movement bilaterally, CTAB RRR,No Gallops,Rubs or new Murmurs, No Parasternal Heave +ve B.Sounds, Abd Soft, No tenderness, No rebound - guarding or rigidity. No Cyanosis, Clubbing ,+1 edema, No new Rash or bruise       Data Reviewed: I have personally reviewed following labs and imaging studies  CBC: Recent Labs  Lab 06/18/19 1343 06/18/19 2229 06/19/19 1345 06/20/19 0550 06/21/19 0152 06/22/19 0003  WBC 5.4  --  6.4 6.3 7.1 8.6  NEUTROABS 4.2  --  5.9 5.3 6.4 7.7  HGB 10.9* 16.3 10.8* 10.1* 10.0* 10.2*  HCT 33.9* 48.0 33.3* 31.1* 31.0* 31.5*  MCV 98.3  --  96.2 97.2 96.6 96.3  PLT 155  --  142* 132* 128* [id number]*   Basic Metabolic Panel: Recent Labs  Lab 06/18/19 1343 06/18/19 2229 06/19/19 1345 06/20/19 0550 06/21/19 0152 06/22/19 0003  NA 141 138 138 140 139 138  K 4.4 4.4 4.5 4.1 4.3 4.8  CL 102  --  104 104 102 101  CO2 26  --  25 26 26 26   GLUCOSE 105*  --  246* 138* 153* 178*  BUN 21  --  26* 28* 32* 42*  CREATININE 1.10  --  1.13 1.05 1.20 1.09  CALCIUM 8.8*  --  8.3* 8.4* 8.4* 8.4*  MG  --   --   --  2.2 2.2 2.2   GFR: Estimated Creatinine Clearance: 61.3 mL/min (by C-G formula based on SCr of 1.09 mg/dL). Liver Function Tests: Recent Labs  Lab 06/18/19 1343 06/19/19 1345 06/20/19 0550 06/21/19 0152 06/22/19 0003  AST 25 22 19 23 30   ALT 33 27 27 35 46*  ALKPHOS 55 52 47 45 50  BILITOT 0.6 0.8 0.6 0.3 0.4  PROT 6.3* 6.6 5.9* 6.2* 6.0*  ALBUMIN 2.2* 2.5* 2.4* 2.5* 2.5*   No results for input(s): LIPASE, AMYLASE in the last 168 hours. No results for input(s): AMMONIA in the last 168 hours. Coagulation Profile: No results for input(s): INR, PROTIME in the last 168 hours. Cardiac Enzymes: No results for input(s): CKTOTAL, CKMB, CKMBINDEX, TROPONINI in the last 168 hours. BNP (last 3 results) No results for input(s): PROBNP in the last 8760  hours. HbA1C: Recent Labs    06/19/19 1345  HGBA1C 7.4*   CBG: Recent Labs  Lab 06/20/19 1601 06/20/19 2056 06/21/19 0724 06/21/19 1156 06/21/19 2145  GLUCAP 278* 219* 150* 220* 204*   Lipid Profile: No results for input(s): CHOL, HDL, LDLCALC, TRIG, CHOLHDL, LDLDIRECT in the last 72 hours. Thyroid Function Tests: No results for input(s): TSH, T4TOTAL, FREET4, T3FREE, THYROIDAB in the last 72 hours. Anemia Panel: Recent Labs    06/20/19 0550 06/21/19 0152  FERRITIN 809* 722*   Sepsis Labs: Recent Labs  Lab 06/18/19 2150 06/19/19 1345 06/20/19 0550 06/21/19 0152  PROCALCITON <0.10 <0.10 <0.10 0.12  LATICACIDVEN 1.2 2.7*  --   --     Recent Results (from the past 240 hour(s))  Blood culture (routine  x 2)     Status: None (Preliminary result)   Collection Time: 06/18/19  3:28 PM   Specimen: BLOOD  Result Value Ref Range Status   Specimen Description BLOOD BLOOD LEFT FOREARM  Final   Special Requests   Final    BOTTLES DRAWN AEROBIC AND ANAEROBIC Blood Culture results may not be optimal due to an inadequate volume of blood received in culture bottles   Culture   Final    NO GROWTH 4 DAYS Performed at [facility], [place] [address] [place], [place] [address]    Report Status PENDING  Incomplete  Blood culture (routine x 2)     Status: None (Preliminary result)   Collection Time: 06/18/19 10:19 PM   Specimen: BLOOD  Result Value Ref Range Status   Specimen Description BLOOD LEFT ANTECUBITAL  Final   Special Requests   Final    BOTTLES DRAWN AEROBIC AND ANAEROBIC Blood Culture adequate volume   Culture   Final    NO GROWTH 4 DAYS Performed at [facility], [place] [address], [place], [place] [address]    Report Status PENDING  Incomplete     Radiology Studies: No results found.  Scheduled Meds: . aspirin EC  81 mg Oral Daily  . enoxaparin (LOVENOX) injection  40 mg Subcutaneous Q24H  . ezetimibe  10 mg Oral QHS  . fesoterodine  4 mg Oral Daily   . furosemide  20 mg Intravenous Daily  . gabapentin  300 mg Oral TID  . guaiFENesin  1,200 mg Oral BID  . insulin aspart  0-9 Units Subcutaneous TID WC  . isosorbide mononitrate  30 mg Oral Daily  . losartan  25 mg Oral Daily  . methylPREDNISolone (SOLU-MEDROL) injection  40 mg Intravenous Q8H  . pantoprazole  40 mg Oral Daily  . simvastatin  40 mg Oral QHS  . sodium chloride flush  3 mL Intravenous Q12H  . sodium chloride flush  3 mL Intravenous Q12H   Continuous Infusions: . sodium chloride       LOS: 3 days   Phillips Climes, MD [facility] Pager On Amion  If 7PM-7AM, please contact night-coverage 06/22/2019, 11:25 AM

## 2019-06-23 ENCOUNTER — Inpatient Hospital Stay (HOSPITAL_COMMUNITY): Payer: PPO

## 2019-06-23 DIAGNOSIS — R609 Edema, unspecified: Secondary | ICD-10-CM

## 2019-06-23 DIAGNOSIS — U071 COVID-19: Secondary | ICD-10-CM

## 2019-06-23 LAB — CBC WITH DIFFERENTIAL/PLATELET
Abs Immature Granulocytes: 0.04 10*3/uL (ref 0.00–0.07)
Basophils Absolute: 0 10*3/uL (ref 0.0–0.1)
Basophils Relative: 0 %
Eosinophils Absolute: 0 10*3/uL (ref 0.0–0.5)
Eosinophils Relative: 1 %
HCT: 33.3 % — ABNORMAL LOW (ref 39.0–52.0)
Hemoglobin: 10.9 g/dL — ABNORMAL LOW (ref 13.0–17.0)
Immature Granulocytes: 1 %
Lymphocytes Relative: 18 %
Lymphs Abs: 1.1 10*3/uL (ref 0.7–4.0)
MCH: 31.8 pg (ref 26.0–34.0)
MCHC: 32.7 g/dL (ref 30.0–36.0)
MCV: 97.1 fL (ref 80.0–100.0)
Monocytes Absolute: 0.6 10*3/uL (ref 0.1–1.0)
Monocytes Relative: 9 %
Neutro Abs: 4.5 10*3/uL (ref 1.7–7.7)
Neutrophils Relative %: 71 %
Platelets: 187 10*3/uL (ref 150–400)
RBC: 3.43 MIL/uL — ABNORMAL LOW (ref 4.22–5.81)
RDW: 12.3 % (ref 11.5–15.5)
WBC: 6.3 10*3/uL (ref 4.0–10.5)
nRBC: 0 % (ref 0.0–0.2)

## 2019-06-23 LAB — COMPREHENSIVE METABOLIC PANEL
ALT: 71 U/L — ABNORMAL HIGH (ref 0–44)
AST: 39 U/L (ref 15–41)
Albumin: 2.6 g/dL — ABNORMAL LOW (ref 3.5–5.0)
Alkaline Phosphatase: 44 U/L (ref 38–126)
Anion gap: 10 (ref 5–15)
BUN: 41 mg/dL — ABNORMAL HIGH (ref 8–23)
CO2: 27 mmol/L (ref 22–32)
Calcium: 8.5 mg/dL — ABNORMAL LOW (ref 8.9–10.3)
Chloride: 102 mmol/L (ref 98–111)
Creatinine, Ser: 1.05 mg/dL (ref 0.61–1.24)
GFR calc Af Amer: >60 mL/min (ref >60)
GFR calc non Af Amer: >60 mL/min (ref >60)
Glucose, Bld: 124 mg/dL — ABNORMAL HIGH (ref 70–99)
Potassium: 4.6 mmol/L (ref 3.5–5.1)
Sodium: 139 mmol/L (ref 135–145)
Total Bilirubin: 0.5 mg/dL (ref 0.3–1.2)
Total Protein: 5.8 g/dL — ABNORMAL LOW (ref 6.5–8.1)

## 2019-06-23 LAB — CULTURE, BLOOD (ROUTINE X 2)
Culture: NO GROWTH
Culture: NO GROWTH
Special Requests: ADEQUATE

## 2019-06-23 LAB — D-DIMER, QUANTITATIVE: D-Dimer, Quant: 1.15 ug/mL-FEU — ABNORMAL HIGH (ref 0.00–0.50)

## 2019-06-23 LAB — MAGNESIUM: Magnesium: 2.2 mg/dL (ref 1.7–2.4)

## 2019-06-23 LAB — C-REACTIVE PROTEIN: CRP: 1.3 mg/dL — ABNORMAL HIGH (ref <1.0)

## 2019-06-23 LAB — GLUCOSE, CAPILLARY: Glucose-Capillary: 207 mg/dL — ABNORMAL HIGH (ref 70–99)

## 2019-06-23 MED ORDER — DEXAMETHASONE 6 MG PO TABS
6.0000 mg | ORAL_TABLET | Freq: Every day | ORAL | Status: DC
  Administered 2019-06-24 – 2019-06-25 (×2): 6 mg via ORAL
  Filled 2019-06-23 (×2): qty 1

## 2019-06-23 NOTE — Evaluation (Signed)
Occupational Therapy Evaluation Patient Details Name: [name] MRN: [id number] DOB: 12/25/1940 Today's Date: 06/23/2019    History of Present Illness 78 yo male presenting with SOB and increased O2 requirements. Pt with recent COVID + test , at [place], and dc to home (10/15-10/26).  PMH including lumbar spinal stenosis, PSA, PNA, PAD, OA, OSA, MI, lumbar disc disease w/ radiculopathy, HTN, HLD, GERD, DVT, CAD, s/p CABG. BPH,  R TKA, B THA, Lumbar lami, cervical spine surgery, cardiac cath.    Clinical Impression   PTA, pt was living with his wife and was independent; since COVID diagnosis, pt has been using RW. Pt currently performing ADLs and functional mobility at Supervision level demonstrating increased motivation to participate in therapy and return home. Pt participating in mobility in hallway to simulate home distance with Supervision and RW. SpO2 >90% on RA throughout session. Pt would benefit from further acute OT to facilitate safe dc. Recommend dc to home once medically stable per physician.       Follow Up Recommendations  No OT follow up;Supervision - Intermittent    Equipment Recommendations  None recommended by OT    Recommendations for Other Services       Precautions / Restrictions Precautions Precautions: Fall Restrictions Weight Bearing Restrictions: No      Mobility Bed Mobility               General bed mobility comments: Sitting in recliner upon arrival  Transfers Overall transfer level: Needs assistance Equipment used: None Transfers: Sit to/from Stand Sit to Stand: Supervision         General transfer comment: Supervision for safety    Balance Overall balance assessment: Needs assistance Sitting-balance support: Feet unsupported Sitting balance-Leahy Scale: Good     Standing balance support: During functional activity;Bilateral upper extremity supported Standing balance-Leahy Scale: Good                              ADL either performed or assessed with clinical judgement   ADL Overall ADL's : Needs assistance/impaired Eating/Feeding: Independent;Sitting   Grooming: Set up;Sitting   Upper Body Bathing: Supervision/ safety;Sitting   Lower Body Bathing: Supervison/ safety;Sit to/from stand   Upper Body Dressing : Supervision/safety;Sitting   Lower Body Dressing: Supervision/safety;Sit to/from stand Lower Body Dressing Details (indicate cue type and reason): donning slippers Toilet Transfer: Supervision/safety;Ambulation(simulated to recliner)           Functional mobility during ADLs: Supervision/safety;Rolling walker General ADL Comments: Pt performing ADLs and functional mobility at Supervision level     Vision         Perception     Praxis      Pertinent Vitals/Pain Pain Assessment: No/denies pain     Hand Dominance     Extremity/Trunk Assessment Upper Extremity Assessment Upper Extremity Assessment: Overall WFL for tasks assessed   Lower Extremity Assessment Lower Extremity Assessment: Defer to PT evaluation   Cervical / Trunk Assessment Cervical / Trunk Assessment: Normal   Communication Communication Communication: No difficulties   Cognition Arousal/Alertness: Awake/alert Behavior During Therapy: WFL for tasks assessed/performed Overall Cognitive Status: Within Functional Limits for tasks assessed                                     General Comments  SpO2 >90% on RA throughout session. HR elevating to 124 with activity. RR 30s.  Exercises     Shoulder Instructions      Home Living Family/patient expects to be discharged to:: Private residence Living Arrangements: Spouse/significant other Available Help at Discharge: Family Type of Home: House Home Access: Stairs to enter Technical brewer of Steps: 5 Entrance Stairs-Rails: Can reach both [place]: One level     Bathroom Shower/Tub: Occupational psychologist:  Standard Bathroom Accessibility: Yes   Home Equipment: Environmental consultant - 2 wheels;Bedside commode;Shower seat;Hand held shower head          Prior Functioning/Environment Level of Independence: Independent        Comments: Small farm; retired Radio producer Problem List: Decreased activity tolerance;Decreased knowledge of use of DME or AE;Decreased knowledge of precautions      OT Treatment/Interventions: Self-care/ADL training;Therapeutic exercise;Energy conservation;DME and/or AE instruction;Therapeutic activities;Patient/family education    OT Goals(Current goals can be found in the care plan section) Acute Rehab OT Goals Patient Stated Goal: "Get out of here" OT Goal Formulation: With patient Time For Goal Achievement: 07/07/19 Potential to Achieve Goals: Good  OT Frequency: Min 2X/week   Barriers to D/C:            Co-evaluation              AM-PAC OT "6 Clicks" Daily Activity     Outcome Measure Help from another person eating meals?: None Help from another person taking care of personal grooming?: None Help from another person toileting, which includes using toliet, bedpan, or urinal?: None Help from another person bathing (including washing, rinsing, drying)?: None Help from another person to put on and taking off regular upper body clothing?: None Help from another person to put on and taking off regular lower body clothing?: None 6 Click Score: 24   End of Session Equipment Utilized During Treatment: Rolling walker Nurse Communication: Mobility status  Activity Tolerance: Patient tolerated treatment well Patient left: in bed;with call bell/phone within reach(with ultrasound)  OT Visit Diagnosis: Unsteadiness on feet (R26.81);Other abnormalities of gait and mobility (R26.89);Muscle weakness (generalized) (M62.81)                Time: [id number] OT Time Calculation (min): 20 min Charges:  OT General Charges $OT Visit: 1 Visit OT Evaluation $OT  Eval Moderate Complexity: 1 Mod  Tobias Avitabile MSOT, OTR/L Acute Rehab Pager: [phone] Office: [place] 06/23/2019, 4:29 PM

## 2019-06-23 NOTE — Progress Notes (Signed)
Pt with 6 bt run of Vtach. Vitals WNL and pt denies chest pain, sob, dizziness. MD on call paged at this time. Will continue to monitor.

## 2019-06-23 NOTE — Progress Notes (Signed)
PROGRESS NOTE    [name]  [id number] DOB: Jan 10, 1941 DOA: 06/18/2019 PCP: Ria Bush, MD    Brief Narrative:  78 y.o. male with medical history significant of CAD s/p CABG 1990, PVD, prediabetes, OSA not on CPAP, HTN and HLD who was admitted at [facility] 10/15 with covid-19 pneumonia causing hypoxic respiratory failure after initially testing positive in presurgical screening 10/5 story of OSA does not tolerate CPAP, presented with shortness of breath, with increased oxygen requirement 4 to 2 L nasal cannula, fever 101.3, was transferred to [facility] for further management.   Subjective: No further dyspnea or bradycardia after his metoprolol has been stopped, report dyspnea has improved, ambulated in the hallway yesterday with no lightheadedness.  Assessment & Plan:   Active Problems:   Hyperlipidemia   Essential hypertension   Carotid stenosis   PVD (peripheral vascular disease) with claudication (HCC)   GERD   Pneumonia due to COVID-19 virus  Acute hypoxic respiratory failure -With recent COVID-19 infection, discharged on 2 L nasal cannula, presents with increased oxygen requirement, this does appear to be multifactorial, secondary to mild COPD flare, and evidence of volume overload, and not fully recovered from COVID-19 infection.  -Encouraged to use incentive spirometry, flutter valve, to get out of bed to chair.  COVID-19 pneumonia -Still evident on CT chest, as well his CRP still limit currently elevated on admission, so he is back on IV steroids. elevated, -Continue to trend inflammatory markers, D-dimers trending up, will monitor closely, venous duplex is pending. Marland Kitchen COVID-19 Labs  Recent Labs    06/21/19 0152 06/22/19 0003 06/23/19 0324  DDIMER 0.92* 0.81* 1.15*  FERRITIN 722*  --   --   CRP 4.5* 2.7*  --     Lab Results  Component Value Date   SARSCOV2NAA POSITIVE (A) 05/31/2019   SARSCOV2NAA POSITIVE (A) 05/20/2019   Acute on  chronic systolic/diastolic CHF -2D echo showing evidence of grade 1 diastolic dysfunction, and low EF 35 to 40%, (patient with recent stress test, low risk, 38% EF) patient is diuresing appropriately on current dose of Lasix, he is 6.1 L negative balance so far .  Keep on current dose Lasix did not increase when he was with some dizziness yesterday. -Started on metoprolol, which has been discontinued given significant symptomatic bradycardia . -Continue with losartan. -Continue with Imdur, if there is room in blood pressure will add hydralazine . -remains  with significant lower extremity edema, instructed him to fluid restrict 1.8 L/day obtain venous Doppler as well -It was 6 beats of nonsustained V. tach, magnesium and potassium are appropriate, cannot start any beta-blockers given symptomatic bradycardia even on very low-dose metoprolol.  COPD exacerbation -No further wheezing, will change IV Solu-Medrol to Decadron  Diabetes mellitus -Continue with insulin sliding scale  Hypertension -Pressure acceptable, continue with Imdur, Toprol has been stopped given bradycardia, to need to monitor closely as he was started on losartan as well.  Hyperlipidemia -Continue with statin  CAD -Denies any chest pain, please see above discussion under CHF, resume on aspirin  GERD -Continue with PPI  DVT prophylaxis: Lovenox subq Code Status: DNR Family Communication: Discussed with wife via phone today Disposition Plan: Home once stable  Consultants:     Procedures:     Antimicrobials: Anti-infectives (From admission, onward)   None       Objective: Vitals:   06/22/19 2114 06/23/19 0102 06/23/19 0600 06/23/19 0653  BP: (!) 123/57 (!) 118/56  127/62  Pulse: 76 63  (!)  59  Resp: 19 18  14   Temp: 98.1 F (36.7 C)   98 F (36.7 C)  TempSrc: Oral   Oral  SpO2: 93% 95%  98%  Weight:   90.5 kg   Height:        Intake/Output Summary (Last 24 hours) at 06/23/2019 1131 Last data  filed at 06/23/2019 0800 Gross per 24 hour  Intake 1210 ml  Output 2275 ml  Net -1065 ml   Filed Weights   06/20/19 2300 06/23/19 0600  Weight: 92.8 kg 90.5 kg    Examination:  Awake Alert, Oriented X 3, No new F.N deficits, Normal affect Symmetrical Chest wall movement, Good air movement bilaterally, CTAB RRR,No Gallops,Rubs or new Murmurs, No Parasternal Heave +ve B.Sounds, Abd Soft, No tenderness, No rebound - guarding or rigidity. No Cyanosis, Clubbing, trace lower extremity edema, No new Rash or bruise       Data Reviewed: I have personally reviewed following labs and imaging studies  CBC: Recent Labs  Lab 06/19/19 1345 06/20/19 0550 06/21/19 0152 06/22/19 0003 06/23/19 0324  WBC 6.4 6.3 7.1 8.6 6.3  NEUTROABS 5.9 5.3 6.4 7.7 4.5  HGB 10.8* 10.1* 10.0* 10.2* 10.9*  HCT 33.3* 31.1* 31.0* 31.5* 33.3*  MCV 96.2 97.2 96.6 96.3 97.1  PLT 142* 132* 128* 143* [id number]   Basic Metabolic Panel: Recent Labs  Lab 06/19/19 1345 06/20/19 0550 06/21/19 0152 06/22/19 0003 06/23/19 0324  NA 138 140 139 138 139  K 4.5 4.1 4.3 4.8 4.6  CL 104 104 102 101 102  CO2 25 26 26 26 27   GLUCOSE 246* 138* 153* 178* 124*  BUN 26* 28* 32* 42* 41*  CREATININE 1.13 1.05 1.20 1.09 1.05  CALCIUM 8.3* 8.4* 8.4* 8.4* 8.5*  MG  --  2.2 2.2 2.2 2.2   GFR: Estimated Creatinine Clearance: 63.6 mL/min (by C-G formula based on SCr of 1.05 mg/dL). Liver Function Tests: Recent Labs  Lab 06/19/19 1345 06/20/19 0550 06/21/19 0152 06/22/19 0003 06/23/19 0324  AST 22 19 23 30  39  ALT 27 27 35 46* 71*  ALKPHOS 52 47 45 50 44  BILITOT 0.8 0.6 0.3 0.4 0.5  PROT 6.6 5.9* 6.2* 6.0* 5.8*  ALBUMIN 2.5* 2.4* 2.5* 2.5* 2.6*   No results for input(s): LIPASE, AMYLASE in the last 168 hours. No results for input(s): AMMONIA in the last 168 hours. Coagulation Profile: No results for input(s): INR, PROTIME in the last 168 hours. Cardiac Enzymes: No results for input(s): CKTOTAL, CKMB, CKMBINDEX,  TROPONINI in the last 168 hours. BNP (last 3 results) No results for input(s): PROBNP in the last 8760 hours. HbA1C: No results for input(s): HGBA1C in the last 72 hours. CBG: Recent Labs  Lab 06/21/19 0724 06/21/19 1156 06/21/19 2145 06/22/19 1122 06/22/19 1527  GLUCAP 150* 220* 204* 243* 205*   Lipid Profile: No results for input(s): CHOL, HDL, LDLCALC, TRIG, CHOLHDL, LDLDIRECT in the last 72 hours. Thyroid Function Tests: No results for input(s): TSH, T4TOTAL, FREET4, T3FREE, THYROIDAB in the last 72 hours. Anemia Panel: Recent Labs    06/21/19 0152  FERRITIN 722*   Sepsis Labs: Recent Labs  Lab 06/18/19 2150 06/19/19 1345 06/20/19 0550 06/21/19 0152  PROCALCITON <0.10 <0.10 <0.10 0.12  LATICACIDVEN 1.2 2.7*  --   --     Recent Results (from the past 240 hour(s))  Blood culture (routine x 2)     Status: None   Collection Time: 06/18/19  3:28 PM   Specimen: BLOOD  Result Value Ref Range Status   Specimen Description BLOOD BLOOD LEFT FOREARM  Final   Special Requests   Final    BOTTLES DRAWN AEROBIC AND ANAEROBIC Blood Culture results may not be optimal due to an inadequate volume of blood received in culture bottles   Culture   Final    NO GROWTH 5 DAYS Performed at [facility], [place] [address] [place], [place] [address]    Report Status 06/23/2019 FINAL  Final  Blood culture (routine x 2)     Status: None   Collection Time: 06/18/19 10:19 PM   Specimen: BLOOD  Result Value Ref Range Status   Specimen Description BLOOD LEFT ANTECUBITAL  Final   Special Requests   Final    BOTTLES DRAWN AEROBIC AND ANAEROBIC Blood Culture adequate volume   Culture   Final    NO GROWTH 5 DAYS Performed at [facility], [place] [address] [place], [place] [address]    Report Status 06/23/2019 FINAL  Final     Radiology Studies: No results found.  Scheduled Meds: . aspirin EC  81 mg Oral Daily  . enoxaparin (LOVENOX) injection  40 mg Subcutaneous Q24H  .  ezetimibe  10 mg Oral QHS  . fesoterodine  4 mg Oral Daily  . furosemide  20 mg Intravenous Daily  . gabapentin  300 mg Oral TID  . guaiFENesin  1,200 mg Oral BID  . insulin aspart  0-9 Units Subcutaneous TID WC  . isosorbide mononitrate  30 mg Oral Daily  . losartan  25 mg Oral Daily  . methylPREDNISolone (SOLU-MEDROL) injection  40 mg Intravenous Daily  . pantoprazole  40 mg Oral Daily  . simvastatin  40 mg Oral QHS  . sodium chloride flush  3 mL Intravenous Q12H  . sodium chloride flush  3 mL Intravenous Q12H   Continuous Infusions: . sodium chloride       LOS: 4 days   Phillips Climes, MD [facility] Pager On Amion  If 7PM-7AM, please contact night-coverage 06/23/2019, 11:31 AM

## 2019-06-23 NOTE — Progress Notes (Signed)
VASCULAR LAB PRELIMINARY  PRELIMINARY  PRELIMINARY  PRELIMINARY  Bilateral lower extremity venous duplex completed.    Preliminary report:  See CV proc for preliminary results.  Bekah Igoe, RVT 06/23/2019, 5:31 PM

## 2019-06-24 DIAGNOSIS — R079 Chest pain, unspecified: Secondary | ICD-10-CM

## 2019-06-24 DIAGNOSIS — I209 Angina pectoris, unspecified: Secondary | ICD-10-CM

## 2019-06-24 LAB — GLUCOSE, CAPILLARY
Glucose-Capillary: 231 mg/dL — ABNORMAL HIGH (ref 70–99)
Glucose-Capillary: 146 mg/dL — ABNORMAL HIGH (ref 70–99)
Glucose-Capillary: 204 mg/dL — ABNORMAL HIGH (ref 70–99)
Glucose-Capillary: 131 mg/dL — ABNORMAL HIGH (ref 70–99)
Glucose-Capillary: 131 mg/dL — ABNORMAL HIGH (ref 70–99)
Glucose-Capillary: 93 mg/dL (ref 70–99)
Glucose-Capillary: 248 mg/dL — ABNORMAL HIGH (ref 70–99)
Glucose-Capillary: 230 mg/dL — ABNORMAL HIGH (ref 70–99)
Glucose-Capillary: 252 mg/dL — ABNORMAL HIGH (ref 70–99)

## 2019-06-24 LAB — BASIC METABOLIC PANEL
Anion gap: 10 (ref 5–15)
BUN: 49 mg/dL — ABNORMAL HIGH (ref 8–23)
CO2: 28 mmol/L (ref 22–32)
Calcium: 8.1 mg/dL — ABNORMAL LOW (ref 8.9–10.3)
Chloride: 96 mmol/L — ABNORMAL LOW (ref 98–111)
Creatinine, Ser: 1.31 mg/dL — ABNORMAL HIGH (ref 0.61–1.24)
GFR calc Af Amer: >60 mL/min (ref >60)
GFR calc non Af Amer: 52 mL/min — ABNORMAL LOW (ref >60)
Glucose, Bld: 108 mg/dL — ABNORMAL HIGH (ref 70–99)
Potassium: 4.1 mmol/L (ref 3.5–5.1)
Sodium: 134 mmol/L — ABNORMAL LOW (ref 135–145)

## 2019-06-24 LAB — TROPONIN I (HIGH SENSITIVITY)
Troponin I (High Sensitivity): 7 ng/L (ref <18)
Troponin I (High Sensitivity): 8 ng/L (ref <18)

## 2019-06-24 LAB — C-REACTIVE PROTEIN: CRP: 1 mg/dL — ABNORMAL HIGH (ref <1.0)

## 2019-06-24 LAB — CBC
HCT: 35.4 % — ABNORMAL LOW (ref 39.0–52.0)
Hemoglobin: 11.5 g/dL — ABNORMAL LOW (ref 13.0–17.0)
MCH: 31.1 pg (ref 26.0–34.0)
MCHC: 32.5 g/dL (ref 30.0–36.0)
MCV: 95.7 fL (ref 80.0–100.0)
Platelets: 243 10*3/uL (ref 150–400)
RBC: 3.7 MIL/uL — ABNORMAL LOW (ref 4.22–5.81)
RDW: 12.2 % (ref 11.5–15.5)
WBC: 6.2 10*3/uL (ref 4.0–10.5)
nRBC: 0 % (ref 0.0–0.2)

## 2019-06-24 LAB — MAGNESIUM: Magnesium: 2.3 mg/dL (ref 1.7–2.4)

## 2019-06-24 MED ORDER — ATORVASTATIN CALCIUM 10 MG PO TABS
20.0000 mg | ORAL_TABLET | Freq: Every day | ORAL | Status: DC
  Administered 2019-06-25: 20 mg via ORAL
  Filled 2019-06-24: qty 2

## 2019-06-24 MED ORDER — ALUM & MAG HYDROXIDE-SIMETH 200-200-20 MG/5ML PO SUSP: 30.0000 mL | Freq: Once | ORAL | Status: AC

## 2019-06-24 MED ORDER — ISOSORBIDE MONONITRATE ER 30 MG PO TB24
30.0000 mg | ORAL_TABLET | Freq: Once | ORAL | Status: AC
  Administered 2019-06-24: 30 mg via ORAL
  Filled 2019-06-24: qty 1

## 2019-06-24 MED ORDER — LIDOCAINE VISCOUS HCL 2 % MT SOLN
15.0000 mL | Freq: Once | OROMUCOSAL | Status: AC
  Administered 2019-06-24: 15 mL via ORAL
  Filled 2019-06-24: qty 15

## 2019-06-24 MED ORDER — NITROGLYCERIN 0.4 MG SL SUBL
0.4000 mg | SUBLINGUAL_TABLET | SUBLINGUAL | Status: DC | PRN
  Administered 2019-06-24: 0.4 mg via SUBLINGUAL
  Administered 2019-06-24: 09:00:00 via SUBLINGUAL
  Administered 2019-06-24: 0.4 mg via SUBLINGUAL

## 2019-06-24 MED ORDER — ASPIRIN 81 MG PO CHEW
324.0000 mg | CHEWABLE_TABLET | Freq: Once | ORAL | Status: AC
  Administered 2019-06-24: 09:00:00 324 mg via ORAL
  Filled 2019-06-24: qty 4

## 2019-06-24 MED ORDER — DICYCLOMINE HCL 10 MG PO CAPS
10.0000 mg | ORAL_CAPSULE | Freq: Once | ORAL | Status: AC
  Administered 2019-06-24: 10 mg via ORAL
  Filled 2019-06-24: qty 1

## 2019-06-24 MED ORDER — NITROGLYCERIN 0.4 MG SL SUBL
SUBLINGUAL_TABLET | SUBLINGUAL | Status: AC
  Administered 2019-06-24: 09:00:00 via SUBLINGUAL
  Filled 2019-06-24: qty 1

## 2019-06-24 MED ORDER — PANTOPRAZOLE SODIUM 40 MG IV SOLR: 40.0000 mg | Freq: Once | INTRAVENOUS | Status: DC

## 2019-06-24 MED ORDER — ALUM & MAG HYDROXIDE-SIMETH 200-200-20 MG/5ML PO SUSP
30.0000 mL | Freq: Once | ORAL | Status: AC
  Administered 2019-06-24: 30 mL via ORAL
  Filled 2019-06-24: qty 30

## 2019-06-24 MED ORDER — RANOLAZINE ER 500 MG PO TB12
500.0000 mg | ORAL_TABLET | Freq: Two times a day (BID) | ORAL | Status: DC
  Administered 2019-06-24 – 2019-06-25 (×2): 500 mg via ORAL
  Filled 2019-06-24 (×4): qty 1

## 2019-06-24 MED ORDER — SIMETHICONE 80 MG PO CHEW
80.0000 mg | CHEWABLE_TABLET | Freq: Four times a day (QID) | ORAL | Status: DC | PRN
  Administered 2019-06-24: 80 mg via ORAL
  Filled 2019-06-24 (×2): qty 1

## 2019-06-24 MED ORDER — DICYCLOMINE HCL 10 MG/5ML PO SOLN
10.0000 mg | Freq: Once | ORAL | Status: DC
  Filled 2019-06-24: qty 5

## 2019-06-24 MED ORDER — ISOSORBIDE MONONITRATE ER 30 MG PO TB24
60.0000 mg | ORAL_TABLET | Freq: Every day | ORAL | Status: DC
  Administered 2019-06-25: 08:00:00 60 mg via ORAL
  Filled 2019-06-24: qty 2

## 2019-06-24 MED ORDER — HYOSCYAMINE SULFATE 0.125 MG SL SUBL
0.2500 mg | SUBLINGUAL_TABLET | Freq: Once | SUBLINGUAL | Status: DC
  Filled 2019-06-24: qty 2

## 2019-06-24 NOTE — Progress Notes (Signed)
Spoke with patient nurse regarding order for additional IV access.  Patient currently has 1 peripheral IV (patent) with no IV medications ordered at this time. IV RN informed patient RN that if patient has a change in status that warrants a 2nd IV to place a consult. Patient RN voiced understanding.

## 2019-06-24 NOTE — Progress Notes (Signed)
Assisted tele visit to patient with provider. Dr cooper with cardiology  Lenox Ahr, RN

## 2019-06-24 NOTE — Progress Notes (Signed)
OT Cancellation Note  Patient Details Name: [name] MRN: [id number] DOB: February 06, 1941   Cancelled Treatment:    Reason Eval/Treat Not Completed: Other (comment) Pt with sudden onset of chest pain earlier this date and to be evaluated by cardiology. Will hold OT this date and continue progression of care plan next date.  Zenovia Jarred, MSOT, OTR/L Behavioral Health OT/ Acute Relief OT [facility] Office: [place] 06/24/2019, 4:59 PM

## 2019-06-24 NOTE — Plan of Care (Signed)
  Problem: Pain Managment: Goal: General experience of comfort will improve Outcome: Progressing   

## 2019-06-24 NOTE — Progress Notes (Signed)
Rapid Response Event Note  Overview: Patient complained of worsening chest pain. Significant history of CAD s/p CABG.      Initial Focused Assessment: Alert and oriented, No shortness of breath, Sp02 92% on room air, CP 10/10, patient complained of epigastric pain and gas.    Interventions: Patient moved from chair to bed. 3 doses of nitroglycerin .04mg  tablet sublingual administered. EKG performed. 2L nasal cannula. Dr. Waldron Labs paged. Patient requested to be changed to full code.   Plan of Care (if not transferred): Continue to assess cardiac enzymes and chest pain. If not improved, will start nitroglycerin drip.   Event Summary:   at   0835 Rapid Response called for chest pain. Responded.    at  09:00 Dr. Waldron Labs arrived to assess patient. After 3rd dose of nitroglycerin the patient reported reduced chest pain 6/10 and requested again to Dr. Waldron Labs to have a code status change.         Elspeth Cho

## 2019-06-24 NOTE — Progress Notes (Signed)
PROGRESS NOTE    [name]  [id number] DOB: 11-18-1940 DOA: 06/18/2019 PCP: Ria Bush, MD    Brief Narrative:  78 y.o. male with medical history significant of CAD s/p CABG 1990, PVD, prediabetes, OSA not on CPAP, HTN and HLD who was admitted at [facility] 10/15 with covid-19 pneumonia causing hypoxic respiratory failure after initially testing positive in presurgical screening 10/5 story of OSA does not tolerate CPAP, presented with shortness of breath, with increased oxygen requirement 4 to 2 L nasal cannula, fever 101.3, was transferred to [facility] for further management.   Subjective: No further dyspnea or bradycardia after his metoprolol has been stopped, this morning patient had an episode of chest pain while sitting on the chair, report 10 out of 10, resolved with nitro x3    Assessment & Plan:   Active Problems:   Hyperlipidemia   Essential hypertension   Carotid stenosis   PVD (peripheral vascular disease) with claudication (HCC)   GERD   Pneumonia due to COVID-19 virus  Acute hypoxic respiratory failure -With recent COVID-19 infection, discharged on 2 L nasal cannula, presents with increased oxygen requirement, this does appear to be multifactorial, secondary to mild COPD flare, and evidence of volume overload, and not fully recovered from COVID-19 infection.  -Encouraged to use incentive spirometry, flutter valve, to get out of bed to chair.  COVID-19 pneumonia -Still evident on CT chest, as well his CRP still limit currently elevated on admission, so he is back on IV steroids. -Continue to trend inflammatory markers, D-dimers trending up, will monitor closely, venous duplex is pending. Marland Kitchen COVID-19 Labs  Recent Labs    06/22/19 0003 06/23/19 0324 06/24/19 0233  DDIMER 0.81* 1.15*  --   CRP 2.7* 1.3* 1.0*    Lab Results  Component Value Date   SARSCOV2NAA POSITIVE (A) 05/31/2019   SARSCOV2NAA POSITIVE (A) 05/20/2019   Acute on  chronic systolic/diastolic CHF -2D echo showing evidence of grade 1 diastolic dysfunction, and low EF 35 to 40%, (patient with recent stress test, low risk, 38% EF) patient is diuresing appropriately on current dose of Lasix, before he is 8.2 L negative balance during hospital stay, he appears to be a euvolemic today, I have stopped his Lasix given his creatinine started to trend up. -Tried low-dose metoprolol, which has been discontinued given significant symptomatic bradycardia . -Started low-dose losartan, which she has been tolerating very well -Continue with Imdur, if there is room in blood pressure will add hydralazine .  Nonsustained V. Tach -Patient had 6 beats of V. tach on 11/8,magnesium and potassium are appropriate, cannot start any beta-blockers given symptomatic bradycardia even on very low-dose metoprolol.  Chest pain/CAD -Patient with known history of CAD, recent stress test last August significant for low risk, fixed defect, and EF 38%. -Continue with aspirin and statin, unable to tolerate beta-blockers, he was started on losartan, continue with Imdur, was started on Ranexa by Dr. Rockey Situ, currently on hold given soft blood pressure. -Patient with midsternal chest pain this morning, sounds cardiac, resolved with nitro x3, EKG nonacute, first troponin is negative, he received full dose aspirin, cardiology consulted regarding further input.  COPD exacerbation -No further wheezing, will change IV Solu-Medrol to Decadron  Diabetes mellitus -Continue with insulin sliding scale  Hypertension -Pressure acceptable, continue with Imdur, Toprol has been stopped given bradycardia, to need to monitor closely as he was started on losartan as well.  Hyperlipidemia -Continue with statin  CAD -Denies any chest pain, please see above  discussion under CHF, resume on aspirin  GERD -Continue with PPI  DVT prophylaxis: Lovenox subq Code Status: DNR Family Communication: Updating wife via  phone daily Disposition Plan: Home once stable  Consultants:   cardiology consult requested regarding chest pain, CHF and CAD and nonsustained V. tach  Procedures:     Antimicrobials: Anti-infectives (From admission, onward)   None       Objective: Vitals:   06/23/19 1905 06/23/19 2035 06/23/19 2325 06/24/19 0410  BP:  134/62 130/65 134/67  Pulse: 97 79 84 75  Resp: 20 13 18  (!) 22  Temp:  98.1 F (36.7 C) 98 F (36.7 C) 97.9 F (36.6 C)  TempSrc:  Oral Oral Oral  SpO2: 100% 96% 100% 100%  Weight:      Height:        Intake/Output Summary (Last 24 hours) at 06/24/2019 1203 Last data filed at 06/24/2019 0400 Gross per 24 hour  Intake 480 ml  Output 1700 ml  Net -1220 ml   Filed Weights   06/20/19 2300 06/23/19 0600  Weight: 92.8 kg 90.5 kg    Examination:  Awake Alert, Oriented X 3, No new F.N deficits, Normal affect Symmetrical Chest wall movement, Good air movement bilaterally, CTAB RRR,No Gallops,Rubs or new Murmurs, No Parasternal Heave +ve B.Sounds, Abd Soft, No tenderness, No rebound - guarding or rigidity. No Cyanosis, Clubbing or edema, No new Rash or bruise         Data Reviewed: I have personally reviewed following labs and imaging studies  CBC: Recent Labs  Lab 06/19/19 1345 06/20/19 0550 06/21/19 0152 06/22/19 0003 06/23/19 0324 06/24/19 0233  WBC 6.4 6.3 7.1 8.6 6.3 6.2  NEUTROABS 5.9 5.3 6.4 7.7 4.5  --   HGB 10.8* 10.1* 10.0* 10.2* 10.9* 11.5*  HCT 33.3* 31.1* 31.0* 31.5* 33.3* 35.4*  MCV 96.2 97.2 96.6 96.3 97.1 95.7  PLT 142* 132* 128* 143* 187 0000000   Basic Metabolic Panel: Recent Labs  Lab 06/20/19 0550 06/21/19 0152 06/22/19 0003 06/23/19 0324 06/24/19 0233  NA 140 139 138 139 134*  K 4.1 4.3 4.8 4.6 4.1  CL 104 102 101 102 96*  CO2 26 26 26 27 28   GLUCOSE 138* 153* 178* 124* 108*  BUN 28* 32* 42* 41* 49*  CREATININE 1.05 1.20 1.09 1.05 1.31*  CALCIUM 8.4* 8.4* 8.4* 8.5* 8.1*  MG 2.2 2.2 2.2 2.2 2.3    GFR: Estimated Creatinine Clearance: 51 mL/min (A) (by C-G formula based on SCr of 1.31 mg/dL (H)). Liver Function Tests: Recent Labs  Lab 06/19/19 1345 06/20/19 0550 06/21/19 0152 06/22/19 0003 06/23/19 0324  AST 22 19 23 30  39  ALT 27 27 35 46* 71*  ALKPHOS 52 47 45 50 44  BILITOT 0.8 0.6 0.3 0.4 0.5  PROT 6.6 5.9* 6.2* 6.0* 5.8*  ALBUMIN 2.5* 2.4* 2.5* 2.5* 2.6*   No results for input(s): LIPASE, AMYLASE in the last 168 hours. No results for input(s): AMMONIA in the last 168 hours. Coagulation Profile: No results for input(s): INR, PROTIME in the last 168 hours. Cardiac Enzymes: No results for input(s): CKTOTAL, CKMB, CKMBINDEX, TROPONINI in the last 168 hours. BNP (last 3 results) No results for input(s): PROBNP in the last 8760 hours. HbA1C: No results for input(s): HGBA1C in the last 72 hours. CBG: Recent Labs  Lab 06/22/19 1527 06/22/19 2135 06/23/19 0728 06/23/19 1633 06/24/19 0736  GLUCAP 205* 204* 131* 207* 93   Lipid Profile: No results for input(s): CHOL, HDL, LDLCALC,  TRIG, CHOLHDL, LDLDIRECT in the last 72 hours. Thyroid Function Tests: No results for input(s): TSH, T4TOTAL, FREET4, T3FREE, THYROIDAB in the last 72 hours. Anemia Panel: No results for input(s): VITAMINB12, FOLATE, FERRITIN, TIBC, IRON, RETICCTPCT in the last 72 hours. Sepsis Labs: Recent Labs  Lab 06/18/19 2150 06/19/19 1345 06/20/19 0550 06/21/19 0152  PROCALCITON <0.10 <0.10 <0.10 0.12  LATICACIDVEN 1.2 2.7*  --   --     Recent Results (from the past 240 hour(s))  Blood culture (routine x 2)     Status: None   Collection Time: 06/18/19  3:28 PM   Specimen: BLOOD  Result Value Ref Range Status   Specimen Description BLOOD BLOOD LEFT FOREARM  Final   Special Requests   Final    BOTTLES DRAWN AEROBIC AND ANAEROBIC Blood Culture results may not be optimal due to an inadequate volume of blood received in culture bottles   Culture   Final    NO GROWTH 5 DAYS Performed at  [facility], [place] [address], [place], [place] [address]    Report Status 06/23/2019 FINAL  Final  Blood culture (routine x 2)     Status: None   Collection Time: 06/18/19 10:19 PM   Specimen: BLOOD  Result Value Ref Range Status   Specimen Description BLOOD LEFT ANTECUBITAL  Final   Special Requests   Final    BOTTLES DRAWN AEROBIC AND ANAEROBIC Blood Culture adequate volume   Culture   Final    NO GROWTH 5 DAYS Performed at [facility], [place] [address] [place],  [address]    Report Status 06/23/2019 FINAL  Final     Radiology Studies: Vas [place] Lower Extremity Venous (dvt)  Result Date: 06/23/2019  Lower Venous Study Indications: SOB, Edema, and Covid positive.  Comparison Study: No prior study on file Performing Technologist: Sharion Dove RVS  Examination Guidelines: A complete evaluation includes B-mode imaging, spectral Doppler, color Doppler, and power Doppler as needed of all accessible portions of each vessel. Bilateral testing is considered an integral part of a complete examination. Limited examinations for reoccurring indications may be performed as noted.  +---------+---------------+---------+-----------+----------+--------------+  RIGHT     Compressibility Phasicity Spontaneity Properties Thrombus Aging  +---------+---------------+---------+-----------+----------+--------------+  CFV       Full            Yes       Yes                                    +---------+---------------+---------+-----------+----------+--------------+  SFJ       Full                                                             +---------+---------------+---------+-----------+----------+--------------+  FV Prox   Full                                                             +---------+---------------+---------+-----------+----------+--------------+  FV Mid    Full                                                              +---------+---------------+---------+-----------+----------+--------------+  FV Distal Full                                                             +---------+---------------+---------+-----------+----------+--------------+  PFV       Full                                                             +---------+---------------+---------+-----------+----------+--------------+  POP       Full            Yes       Yes                                    +---------+---------------+---------+-----------+----------+--------------+  PTV       Full                                                             +---------+---------------+---------+-----------+----------+--------------+  PERO      Full                                                             +---------+---------------+---------+-----------+----------+--------------+   +---------+---------------+---------+-----------+----------+--------------+  LEFT      Compressibility Phasicity Spontaneity Properties Thrombus Aging  +---------+---------------+---------+-----------+----------+--------------+  CFV       Full            Yes       Yes                                    +---------+---------------+---------+-----------+----------+--------------+  SFJ       Full                                                             +---------+---------------+---------+-----------+----------+--------------+  FV Prox   Full                                                             +---------+---------------+---------+-----------+----------+--------------+  FV Mid    Full                                                             +---------+---------------+---------+-----------+----------+--------------+  FV Distal Full                                                             +---------+---------------+---------+-----------+----------+--------------+  PFV       Full                                                              +---------+---------------+---------+-----------+----------+--------------+  POP       Full            Yes       Yes                                    +---------+---------------+---------+-----------+----------+--------------+  PTV       Full                                                             +---------+---------------+---------+-----------+----------+--------------+  PERO      Full                                                             +---------+---------------+---------+-----------+----------+--------------+     Summary: Right: There is no evidence of deep vein thrombosis in the lower extremity. Left: There is no evidence of deep vein thrombosis in the lower extremity.  *See table(s) above for measurements and observations.    Preliminary     Scheduled Meds:  alum & mag hydroxide-simeth  30 mL Oral Once   alum & mag hydroxide-simeth  30 mL Oral Once   And   lidocaine  15 mL Oral Once   aspirin EC  81 mg Oral Daily   dexamethasone  6 mg Oral Daily   dicyclomine  10 mg Oral Once   enoxaparin (LOVENOX) injection  40 mg Subcutaneous Q24H   ezetimibe  10 mg Oral QHS   fesoterodine  4 mg Oral Daily   gabapentin  300 mg Oral TID   guaiFENesin  1,200 mg Oral BID   hyoscyamine  0.25 mg Sublingual Once   insulin aspart  0-9 Units Subcutaneous TID WC   isosorbide mononitrate  30 mg Oral Daily   losartan  25 mg Oral Daily   pantoprazole  40 mg Oral Daily   pantoprazole (PROTONIX) IV  40 mg Intravenous Once   simvastatin  40 mg Oral QHS   sodium chloride flush  3 mL Intravenous Q12H   sodium chloride flush  3 mL Intravenous Q12H   Continuous Infusions:  sodium chloride       LOS: 5 days   Phillips Climes, MD [facility] Pager On Amion  If 7PM-7AM, please contact night-coverage 06/24/2019,  12:03 PM

## 2019-06-24 NOTE — Consult Note (Signed)
Cardiology Consultation:   Due to the COVID-19 pandemic, this visit was completed with telemedicine (audio/video) technology to reduce patient and provider exposure as well as to preserve personal protective equipment.   Patient ID: [name] MRN: [id number]; DOB: [birth year]  Admit date: 06/18/2019 Date of Consult: 06/24/2019  Primary Care Provider: Ria Bush, MD Primary Cardiologist: Ida Rogue, MD  Primary Electrophysiologist:  None    Patient Profile:   [name] is a 78 y.o. male with a hx of CAD who is being seen today for the evaluation of chest pain at the request of Dr Waldron Labs.  History of Present Illness:   [name] has a history of remote bypass surgery in 1990.  He has undergone PCI procedure since bypass surgery in 2002 with stenting of the left circumflex.  His most recent heart catheterization in 2016 demonstrated 2 patent bypass grafts and diffuse small vessel disease in the RCA territory.  Medical therapy was recommended.  The patient has had chronic intermittent chest pain with medical management recommended.  He had a recent nuclear stress test on March 27, 2019 and this demonstrated LV systolic dysfunction with a gated LVEF of 38%.  There was a fixed perfusion defect in the anteroseptal and apical regions with considerations of old scar versus attenuation artifact.  There was no significant ischemia identified.  The patient initially presented to [facility] on May 30, 2019 with acute hypoxic respiratory failure secondary to COVID-19 infection.  He was treated with IV Solu-Medrol and remdesivir.  At that time he was noted to have elevated troponin suspicious for demand ischemia.  He was hospitalized at [facility] for about 10 days and is now readmitted on November 3 with recurrent hypoxemia.  He was felt to be fluid overload and has received IV diuresis with slow improvement in his symptoms.  The patient  developed chest pain this morning after cleaning himself up this morning. He experienced severe chest pain that he rates 10/10 with associated tightness down the left arm. Chest pain was relieved with 3 SL NTG and has not recurred. He hasn't had much chest discomfort with physical activity, but admits he has been sedentary because of back problems over recent months. He reports slow improvement in his breathing over recent days and he was off of O2 for much of the day yesterday. He has been diuresed aggressively as he was felt to be volume overloaded.   Past Medical History:  Diagnosis Date  . BENIGN PROSTATIC HYPERTROPHY, WITH URINARY OBSTRUCTION 09/06/2007  . CAD s/p CABG    a. 1990 s/p MI-->CABG x 2; b. 2002 s/p BMS to LCX; c. 06/2015 Cath: LM 50ost, LAD 100ost/p, 69m/d, D2 nl, LCX  patent stent, RCA 80p (small), LIMA->LAD nl, VG->D2 nl-->Med Rx.  . Carotid arterial disease ([place])    a. 08/2016 Carotid U/S: <39% bilat.  . 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 Community acquired pneumonia of right lower lobe of lung 06/05/2017  . COVID-19 virus infection 05/30/2019   Covid PNA with hospitalization 05/2019  . 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 DVT, HX OF 1998  . GERD 04/30/2007  . Headache    hx migraines  . History of hiatal hernia   . History of shingles   . HYPERLIPIDEMIA 04/26/2007  . HYPERTENSION 04/30/2007  . INGUINAL HERNIA, RIGHT 05/26/2010  . Laceration of skin of left hand 03/08/2018  . Lumbar disc disease with radiculopathy   . Myocardial infarction ([place])    1989, 2002  .  OSA (obstructive sleep apnea)    a. did not tolerate CPAP.  Marland Kitchen Osteoarthritis   . PAD (peripheral artery disease) ([place])    a. 07/2017 LE duplex: RSFA 75-66m, LSFA 75-50m, 50-74d.  . Pneumonia 07/2014   [facility] hospitalization  . Pre-diabetes   . PSA, INCREASED 07/09/2008  . Spinal stenosis of lumbar region     Past Surgical History:  Procedure Laterality Date  . ABDOMINAL AORTOGRAM W/LOWER EXTREMITY [name]  09/12/2018   Procedure: ABDOMINAL AORTOGRAM W/LOWER EXTREMITY;  Surgeon: [name], MD;  Location: [facility];  Service: Cardiovascular;  Laterality: [name];  . APPENDECTOMY     rupture  . CARDIAC CATHETERIZATION [name] 07/07/2015   Procedure: Left Heart Cath and Cors/Grafts Angiography;  Surgeon: [name], MD;  Location: [facility];  Service: Cardiovascular;  Laterality: [name];  . CATARACT EXTRACTION, BILATERAL    . CERVICAL SPINE SURGERY  12/2016   cervical stenosis [name])  . COLONOSCOPY  03/2010   HP polyp, diverticulosis, rpt 10 yrs ([name])  . CORONARY ARTERY BYPASS GRAFT  1990   3 vessel   . CYSTOSCOPY  12/23/10   [facility]  . EYE SURGERY     cataract  . KNEE ARTHROSCOPY Right    x2  . LAMINOTOMY  1986   L5/S1 lumbar laminotomy for two ruptured discs/fusion  . LUMBAR LAMINECTOMY/DECOMPRESSION MICRODISCECTOMY [name] 07/13/2016   Procedure: LUMBAR TWO-THREE, LUMBAR THREE-FOUR, LUMBAR FOUR-FIVE LAMINECTOMY AND FORAMINOTOMY;  Surgeon: [name], MD;  Location: [facility];  Service: Neurosurgery;  Laterality: [name];  LAMINECTOMY AND FORAMINOTOMY L2-L3, L3-L4,L4-L5  . TOTAL HIP ARTHROPLASTY Bilateral 1999,2000  . TOTAL KNEE ARTHROPLASTY Right 03/06/2017   Procedure: RIGHT TOTAL KNEE ARTHROPLASTY;  Surgeon: Gaynelle Arabian, MD;  Location: [facility];  Service: Orthopedics;  Laterality: Right;     Home Medications:  Prior to Admission medications   Medication Sig Start Date End Date Taking? Authorizing Provider  acetaminophen (TYLENOL) 650 MG CR tablet Take 1,300 mg by mouth every 8 (eight) hours as needed for pain.   Yes [provider]  albuterol (VENTOLIN HFA) 108 (90 Base) MCG/ACT inhaler Inhale 2 puffs into the lungs every 6 (six) hours as needed for wheezing or shortness of breath. 05/22/19  Yes Ria Bush, MD  Cyanocobalamin (B-12) 1000 MCG SUBL Place 1 tablet under the tongue daily. Patient taking differently: Place 1 tablet under the tongue daily with  lunch.  12/07/18  Yes Ria Bush, MD  ezetimibe (ZETIA) 10 MG tablet TAKE ONE TABLET BY MOUTH EVERY DAY Patient taking differently: Take 10 mg by mouth at bedtime.  04/02/19  Yes Gollan, Kathlene November, MD  furosemide (LASIX) 20 MG tablet Take 1 tablet (20 mg total) by mouth daily as needed for fluid. 06/17/19  Yes Tonia Ghent, MD  gabapentin (NEURONTIN) 300 MG capsule Take 300 mg by mouth 3 (three) times daily.    Yes [provider]  isosorbide mononitrate (IMDUR) 30 MG 24 hr tablet Take 1 tablet (30 mg total) by mouth 2 (two) times a day. 02/20/19 06/18/28 Yes Gollan, Kathlene November, MD  ketoconazole (NIZORAL) 2 % cream Apply 1 application topically daily as needed for irritation.    Yes [provider]  nitroGLYCERIN (NITROSTAT) 0.4 MG SL tablet Place 1 tablet (0.4 mg total) under the tongue every 5 (five) minutes as needed for chest pain. 12/07/18  Yes Ria Bush, MD  polyethylene glycol (MIRALAX / GLYCOLAX) 17 g packet Take 17 g by mouth every other day.  Yes [provider]  simvastatin (ZOCOR) 40 MG tablet TAKE ONE TABLET BY MOUTH EVERY DAY Patient taking differently: Take 40 mg by mouth at bedtime.  04/02/19  Yes Gollan, Kathlene November, MD  tolterodine (DETROL LA) 4 MG 24 hr capsule Take 4 mg by mouth daily.   Yes [provider]  HYDROcodone-homatropine (HYCODAN) 5-1.5 MG/5ML syrup Take 5 mLs by mouth every 6 (six) hours as needed for cough. Patient not taking: Reported on 06/19/2019 05/24/19   Hinda Kehr, MD  ranolazine (RANEXA) 500 MG 12 hr tablet Take 1 tablet (500 mg total) by mouth 2 (two) times daily for 14 days. Patient not taking: Reported on 05/30/2019 04/17/19 05/13/20  [name], MD    Inpatient Medications: Scheduled Meds: . aspirin EC  81 mg Oral Daily  . dexamethasone  6 mg Oral Daily  . enoxaparin (LOVENOX) injection  40 mg Subcutaneous Q24H  . ezetimibe  10 mg Oral QHS  . fesoterodine  4 mg Oral Daily  . gabapentin  300 mg  Oral TID  . guaiFENesin  1,200 mg Oral BID  . hyoscyamine  0.25 mg Sublingual Once  . insulin aspart  0-9 Units Subcutaneous TID WC  . isosorbide mononitrate  30 mg Oral Daily  . losartan  25 mg Oral Daily  . pantoprazole  40 mg Oral Daily  . pantoprazole (PROTONIX) IV  40 mg Intravenous Once  . simvastatin  40 mg Oral QHS  . sodium chloride flush  3 mL Intravenous Q12H  . sodium chloride flush  3 mL Intravenous Q12H   Continuous Infusions: . sodium chloride     PRN Meds: sodium chloride, acetaminophen, albuterol, guaiFENesin-dextromethorphan, HYDROcodone-acetaminophen, nitroGLYCERIN, ondansetron **OR** ondansetron (ZOFRAN) IV, simethicone, sodium chloride flush  Allergies:    Allergies  Allergen Reactions  . Vioxx [Rofecoxib] Other (See Comments)    Hemorrhage   . Contrast Media [Iodinated Diagnostic Agents] Itching and Rash    Delayed reaction post abdominal aortagram.   . Morphine Nausea Only and Other (See Comments)    Irritability     Social History:   Social History   Socioeconomic History  . Marital status: Married    Spouse name: Not on file  . Number of children: Not on file  . Years of education: Not on file  . Highest education level: Not on file  Occupational History  . Not on file  Social Needs  . Financial resource strain: Not on file  . Food insecurity    Worry: Not on file    Inability: Not on file  . Transportation needs    Medical: Not on file    Non-medical: Not on file  Tobacco Use  . Smoking status: Former Smoker    Packs/day: 1.00    Years: 25.00    Pack years: 25.00    Types: Cigarettes    Quit date: 08/15/1988    Years since quitting: 30.8  . Smokeless tobacco: Never Used  Substance and Sexual Activity  . Alcohol use: No    Alcohol/week: 0.0 standard drinks  . Drug use: No  . Sexual activity: Yes  Lifestyle  . Physical activity    Days per week: Not on file    Minutes per session: Not on file  . Stress: Not on file   Relationships  . Social Herbalist on phone: Not on file    Gets together: Not on file    Attends religious service: Not on file  Active member of club or organization: Not on file    Attends meetings of clubs or organizations: Not on file    Relationship status: Not on file  . Intimate partner violence    Fear of current or ex partner: Not on file    Emotionally abused: Not on file    Physically abused: Not on file    Forced sexual activity: Not on file  Other Topics Concern  . Not on file  Social History Narrative   Married, wife [name], for 25 years in 2016. 1 daughter and 3 grandkids from first marriage.    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    Diet: good water, fruits/vegetables daily      OSA eval recommended while hospitalized with Covid 05/2019    Family History:   Family History  Problem Relation Age of Onset  . Stroke Mother   . Heart attack Mother   . Diabetes Mother   . Stroke Father   . Heart disease Father   . Kidney disease Father        PCKD  . Cancer Sister        throat  . Diabetes Brother   . Kidney disease Sister        PCKD  . Cancer Other        5/7 nephews with lung cancer     ROS:  Please see the history of present illness.  All other ROS reviewed and negative.     Physical Exam/Data:   Vitals:   06/23/19 1905 06/23/19 2035 06/23/19 2325 06/24/19 0410  BP:  134/62 130/65 134/67  Pulse: 97 79 84 75  Resp: 20 13 18  (!) 22  Temp:  98.1 F (36.7 C) 98 F (36.7 C) 97.9 F (36.6 C)  TempSrc:  Oral Oral Oral  SpO2: 100% 96% 100% 100%  Weight:      Height:        Intake/Output Summary (Last 24 hours) at 06/24/2019 1340 Last data filed at 06/24/2019 0400 Gross per 24 hour  Intake 480 ml  Output 1700 ml  Net -1220 ml   Last 3 Weights 06/23/2019 06/20/2019 06/10/2019  Weight (lbs)  199 lb 8 oz 204 lb 9.4 oz 203 lb  Weight (kg) 90.493 kg 92.8 kg 92.08 kg     Body mass index is 27.06 kg/m.   VITAL SIGNS:  reviewed GEN:  no acute distress The patient is comfortable he is not short of breath with normal conversation.  Remaining exam deferred as this is a virtual visit.  EKG:  The EKG was personally reviewed and demonstrates: Normal sinus rhythm, left axis deviation, no significant ST or T wave changes  Relevant CV Studies: Echocardiogram 06/20/2019: 1. Left ventricular ejection fraction, by visual estimation, is 35 to 40%. The left ventricle has moderately decreased function. There is mildly increased left ventricular hypertrophy.  2. Left ventricular diastolic parameters are consistent with Grade I diastolic dysfunction (impaired relaxation).  3. The left ventricle demonstrates global hypokinesis.  4. Global right ventricle has normal systolic function.The right ventricular size is mildly enlarged. No increase in right ventricular wall thickness.  5. Left atrial size was normal.  6. Right atrial size was normal.  7. Mild mitral annular calcification.  8. The mitral valve is normal in structure. Trace mitral valve regurgitation. No evidence of mitral  stenosis.  9. The tricuspid valve is normal in structure. Tricuspid valve regurgitation is trivial. 10. The aortic valve is tricuspid. Aortic valve regurgitation is not visualized. Mild to moderate aortic valve sclerosis/calcification without any evidence of aortic stenosis. 11. The inferior vena cava is normal in size with greater than 50% respiratory variability, suggesting right atrial pressure of 3 mmHg. 12. The tricuspid regurgitant velocity is 2.57 m/s, and with an assumed right atrial pressure of 3 mmHg, the estimated right ventricular systolic pressure is normal at 29.4 mmHg. 13. Technically difficult study on suboptimal machine.   Laboratory Data:  Chemistry Recent Labs  Lab 06/22/19 0003 06/23/19 0324  06/24/19 0233  NA 138 139 134*  K 4.8 4.6 4.1  CL 101 102 96*  CO2 26 27 28   GLUCOSE 178* 124* 108*  BUN 42* 41* 49*  CREATININE 1.09 1.05 1.31*  CALCIUM 8.4* 8.5* 8.1*  GFRNONAA >60 >60 52*  GFRAA >60 >60 >60  ANIONGAP 11 10 10     Recent Labs  Lab 06/21/19 0152 06/22/19 0003 06/23/19 0324  PROT 6.2* 6.0* 5.8*  ALBUMIN 2.5* 2.5* 2.6*  AST 23 30 39  ALT 35 46* 71*  ALKPHOS 45 50 44  BILITOT 0.3 0.4 0.5   Hematology Recent Labs  Lab 06/22/19 0003 06/23/19 0324 06/24/19 0233  WBC 8.6 6.3 6.2  RBC 3.27* 3.43* 3.70*  HGB 10.2* 10.9* 11.5*  HCT 31.5* 33.3* 35.4*  MCV 96.3 97.1 95.7  MCH 31.2 31.8 31.1  MCHC 32.4 32.7 32.5  RDW 12.2 12.3 12.2  PLT 143* 187 243   Cardiac EnzymesNo results for input(s): TROPONINI in the last 168 hours. No results for input(s): TROPIPOC in the last 168 hours.  BNP Recent Labs  Lab 06/18/19 1522 06/19/19 1345  BNP 216.0* 275.7*    DDimer  Recent Labs  Lab 06/21/19 0152 06/22/19 0003 06/23/19 0324  DDIMER 0.92* 0.81* 1.15*    Radiology/Studies:  Vas [place] Lower Extremity Venous (dvt)  Result Date: 06/23/2019  Lower Venous Study Indications: SOB, Edema, and Covid positive.  Comparison Study: No prior study on file Performing Technologist: Sharion Dove RVS  Examination Guidelines: A complete evaluation includes B-mode imaging, spectral Doppler, color Doppler, and power Doppler as needed of all accessible portions of each vessel. Bilateral testing is considered an integral part of a complete examination. Limited examinations for reoccurring indications may be performed as noted.  +---------+---------------+---------+-----------+----------+--------------+ RIGHT    CompressibilityPhasicitySpontaneityPropertiesThrombus Aging +---------+---------------+---------+-----------+----------+--------------+ CFV      Full           Yes      Yes                                  +---------+---------------+---------+-----------+----------+--------------+ SFJ      Full                                                        +---------+---------------+---------+-----------+----------+--------------+ FV Prox  Full                                                        +---------+---------------+---------+-----------+----------+--------------+  FV Mid   Full                                                        +---------+---------------+---------+-----------+----------+--------------+ FV DistalFull                                                        +---------+---------------+---------+-----------+----------+--------------+ PFV      Full                                                        +---------+---------------+---------+-----------+----------+--------------+ POP      Full           Yes      Yes                                 +---------+---------------+---------+-----------+----------+--------------+ PTV      Full                                                        +---------+---------------+---------+-----------+----------+--------------+ PERO     Full                                                        +---------+---------------+---------+-----------+----------+--------------+   +---------+---------------+---------+-----------+----------+--------------+ LEFT     CompressibilityPhasicitySpontaneityPropertiesThrombus Aging +---------+---------------+---------+-----------+----------+--------------+ CFV      Full           Yes      Yes                                 +---------+---------------+---------+-----------+----------+--------------+ SFJ      Full                                                        +---------+---------------+---------+-----------+----------+--------------+ FV Prox  Full                                                         +---------+---------------+---------+-----------+----------+--------------+ FV Mid   Full                                                        +---------+---------------+---------+-----------+----------+--------------+  FV DistalFull                                                        +---------+---------------+---------+-----------+----------+--------------+ PFV      Full                                                        +---------+---------------+---------+-----------+----------+--------------+ POP      Full           Yes      Yes                                 +---------+---------------+---------+-----------+----------+--------------+ PTV      Full                                                        +---------+---------------+---------+-----------+----------+--------------+ PERO     Full                                                        +---------+---------------+---------+-----------+----------+--------------+     Summary: Right: There is no evidence of deep vein thrombosis in the lower extremity. Left: There is no evidence of deep vein thrombosis in the lower extremity.  *See table(s) above for measurements and observations.    Preliminary     Assessment and Plan:   1. Angina at rest: Patient with typical symptoms of angina this morning.  However, his EKG shows no acute changes and serial high-sensitivity troponins are negative x2.  I reviewed his past history at length.  He does have known small vessel coronary artery disease.  I will plan to intensify his medical therapy with increasing his isosorbide to 60 mg twice daily.  He will be continued on Ranexa.  He has had bradycardic events on a beta-blocker and so this will not be restarted.  I called the patient's wife and discussed my findings with her as well.  I will follow-up on his progress tomorrow morning via video conferencing again 2. Acute on chronic combined systolic and diastolic heart  failure: The patient appears to be improving based on my review of his weight trending down and negative I/O's.  His respiratory status seems to be better.  His creatinine is slightly trending upward and diuretic therapy has been held today.  Continued management per [facility] hospitalist team.  Overall the patient appears to be clinically improving.  Will reassess his cardiac status tomorrow after increasing his isosorbide dose.     For questions or updates, please contact [place] Please consult [email or web address] for contact info under     Signed, Sherren Mocha, MD  06/24/2019 1:40 PM

## 2019-06-25 ENCOUNTER — Other Ambulatory Visit: Payer: Self-pay

## 2019-06-25 LAB — CBC
HCT: 33.2 % — ABNORMAL LOW (ref 39.0–52.0)
Hemoglobin: 11.1 g/dL — ABNORMAL LOW (ref 13.0–17.0)
MCH: 31.8 pg (ref 26.0–34.0)
MCHC: 33.4 g/dL (ref 30.0–36.0)
MCV: 95.1 fL (ref 80.0–100.0)
Platelets: 293 10*3/uL (ref 150–400)
RBC: 3.49 MIL/uL — ABNORMAL LOW (ref 4.22–5.81)
RDW: 12.4 % (ref 11.5–15.5)
WBC: 6 10*3/uL (ref 4.0–10.5)
nRBC: 0 % (ref 0.0–0.2)

## 2019-06-25 LAB — GLUCOSE, CAPILLARY
Glucose-Capillary: 104 mg/dL — ABNORMAL HIGH (ref 70–99)
Glucose-Capillary: 274 mg/dL — ABNORMAL HIGH (ref 70–99)
Glucose-Capillary: 206 mg/dL — ABNORMAL HIGH (ref 70–99)

## 2019-06-25 LAB — BASIC METABOLIC PANEL
Anion gap: 11 (ref 5–15)
BUN: 37 mg/dL — ABNORMAL HIGH (ref 8–23)
CO2: 26 mmol/L (ref 22–32)
Calcium: 8.2 mg/dL — ABNORMAL LOW (ref 8.9–10.3)
Chloride: 101 mmol/L (ref 98–111)
Creatinine, Ser: 1.02 mg/dL (ref 0.61–1.24)
GFR calc Af Amer: >60 mL/min (ref >60)
GFR calc non Af Amer: >60 mL/min (ref >60)
Glucose, Bld: 127 mg/dL — ABNORMAL HIGH (ref 70–99)
Potassium: 4.1 mmol/L (ref 3.5–5.1)
Sodium: 138 mmol/L (ref 135–145)

## 2019-06-25 LAB — C-REACTIVE PROTEIN: CRP: <0.8 mg/dL (ref <1.0)

## 2019-06-25 MED ORDER — ASPIRIN 81 MG PO TBEC: 81.0000 mg | DELAYED_RELEASE_TABLET | Freq: Every day | ORAL | 0 refills | Status: AC

## 2019-06-25 MED ORDER — FUROSEMIDE 20 MG PO TABS: 20.0000 mg | ORAL_TABLET | Freq: Every day | ORAL | 0 refills | Status: DC

## 2019-06-25 MED ORDER — RANOLAZINE ER 500 MG PO TB12: 500.0000 mg | ORAL_TABLET | Freq: Two times a day (BID) | ORAL | 0 refills | Status: DC

## 2019-06-25 MED ORDER — ISOSORBIDE MONONITRATE ER 60 MG PO TB24: 60.0000 mg | ORAL_TABLET | Freq: Every day | ORAL | 0 refills | Status: DC

## 2019-06-25 MED ORDER — LOSARTAN POTASSIUM 25 MG PO TABS: 25.0000 mg | ORAL_TABLET | Freq: Every day | ORAL | 0 refills | Status: DC

## 2019-06-25 NOTE — Progress Notes (Signed)
Progress Note  Patient Name: [name] Date of Encounter: 06/25/2019  Primary Cardiologist: Ida Rogue, MD   The patient is initially interviewed via video conferencing technology in light of the COVID-19 pandemic in order to reduce both the patient and the providers exposure to COVID-19.  Subjective   The patient is going to be discharged this evening.  He is feeling better.  He has been concerned that his heart rate is increased and with activity has heart rate is above 100 bpm.  His breathing is stable and he has had no recurrent chest pain.  Inpatient Medications    Scheduled Meds: . aspirin EC  81 mg Oral Daily  . atorvastatin  20 mg Oral q1800  . dexamethasone  6 mg Oral Daily  . enoxaparin (LOVENOX) injection  40 mg Subcutaneous Q24H  . ezetimibe  10 mg Oral QHS  . fesoterodine  4 mg Oral Daily  . gabapentin  300 mg Oral TID  . guaiFENesin  1,200 mg Oral BID  . hyoscyamine  0.25 mg Sublingual Once  . insulin aspart  0-9 Units Subcutaneous TID WC  . isosorbide mononitrate  60 mg Oral Daily  . losartan  25 mg Oral Daily  . pantoprazole  40 mg Oral Daily  . pantoprazole (PROTONIX) IV  40 mg Intravenous Once  . ranolazine  500 mg Oral BID  . sodium chloride flush  3 mL Intravenous Q12H  . sodium chloride flush  3 mL Intravenous Q12H   Continuous Infusions: . sodium chloride     PRN Meds: sodium chloride, acetaminophen, albuterol, guaiFENesin-dextromethorphan, HYDROcodone-acetaminophen, nitroGLYCERIN, ondansetron **OR** ondansetron (ZOFRAN) IV, simethicone, sodium chloride flush   Vital Signs    Vitals:   06/25/19 0435 06/25/19 0726 06/25/19 0734 06/25/19 1140  BP: (!) 144/55 99/82 126/61 (!) 112/59  Pulse: 63 70 69 100  Resp: 18 14 20  (!) 23  Temp: 98.5 F (36.9 C)  98.5 F (36.9 C) 98.3 F (36.8 C)  TempSrc: Oral Axillary Oral Oral  SpO2: 99% 95% 97% 98%  Weight:      Height:        Intake/Output Summary (Last 24 hours) at 06/25/2019 1237  Last data filed at 06/25/2019 0904 Gross per 24 hour  Intake 483 ml  Output 2375 ml  Net -1892 ml   Last 3 Weights 06/23/2019 06/20/2019 06/10/2019  Weight (lbs) 199 lb 8 oz 204 lb 9.4 oz 203 lb  Weight (kg) 90.493 kg 92.8 kg 92.08 kg       ECG    Normal sinus rhythm 90 bpm, left axis deviation- Personally Reviewed  Physical Exam  Elderly male, off of oxygen, in no distress GEN: No acute distress.   Remaining exam deferred as this is a video conference  Labs    High Sensitivity Troponin:   Recent Labs  Lab 05/31/19 0023 06/02/19 1640 06/19/19 1345 06/24/19 0922 06/24/19 1047  TROPONINIHS 13 10 8 7 8       Chemistry Recent Labs  Lab 06/21/19 0152 06/22/19 0003 06/23/19 0324 06/24/19 0233 06/25/19 0410  NA 139 138 139 134* 138  K 4.3 4.8 4.6 4.1 4.1  CL 102 101 102 96* 101  CO2 26 26 27 28 26   GLUCOSE 153* 178* 124* 108* 127*  BUN 32* 42* 41* 49* 37*  CREATININE 1.20 1.09 1.05 1.31* 1.02  CALCIUM 8.4* 8.4* 8.5* 8.1* 8.2*  PROT 6.2* 6.0* 5.8*  --   --   ALBUMIN 2.5* 2.5* 2.6*  --   --  AST 23 30 39  --   --   ALT 35 46* 71*  --   --   ALKPHOS 45 50 44  --   --   BILITOT 0.3 0.4 0.5  --   --   GFRNONAA 58* >60 >60 52* >60  GFRAA >60 >60 >60 >60 >60  ANIONGAP 11 11 10 10 11      Hematology Recent Labs  Lab 06/23/19 0324 06/24/19 0233 06/25/19 0410  WBC 6.3 6.2 6.0  RBC 3.43* 3.70* 3.49*  HGB 10.9* 11.5* 11.1*  HCT 33.3* 35.4* 33.2*  MCV 97.1 95.7 95.1  MCH 31.8 31.1 31.8  MCHC 32.7 32.5 33.4  RDW 12.3 12.2 12.4  PLT 187 243 293    BNP Recent Labs  Lab 06/18/19 1522 06/19/19 1345  BNP 216.0* 275.7*     DDimer  Recent Labs  Lab 06/21/19 0152 06/22/19 0003 06/23/19 0324  DDIMER 0.92* 0.81* 1.15*     Radiology    Vas [place] Lower Extremity Venous (dvt)  Result Date: 06/23/2019  Lower Venous Study Indications: SOB, Edema, and Covid positive.  Comparison Study: No prior study on file Performing Technologist: Sharion Dove RVS   Examination Guidelines: A complete evaluation includes B-mode imaging, spectral Doppler, color Doppler, and power Doppler as needed of all accessible portions of each vessel. Bilateral testing is considered an integral part of a complete examination. Limited examinations for reoccurring indications may be performed as noted.  +---------+---------------+---------+-----------+----------+--------------+ RIGHT    CompressibilityPhasicitySpontaneityPropertiesThrombus Aging +---------+---------------+---------+-----------+----------+--------------+ CFV      Full           Yes      Yes                                 +---------+---------------+---------+-----------+----------+--------------+ SFJ      Full                                                        +---------+---------------+---------+-----------+----------+--------------+ FV Prox  Full                                                        +---------+---------------+---------+-----------+----------+--------------+ FV Mid   Full                                                        +---------+---------------+---------+-----------+----------+--------------+ FV DistalFull                                                        +---------+---------------+---------+-----------+----------+--------------+ PFV      Full                                                        +---------+---------------+---------+-----------+----------+--------------+  POP      Full           Yes      Yes                                 +---------+---------------+---------+-----------+----------+--------------+ PTV      Full                                                        +---------+---------------+---------+-----------+----------+--------------+ PERO     Full                                                        +---------+---------------+---------+-----------+----------+--------------+    +---------+---------------+---------+-----------+----------+--------------+ LEFT     CompressibilityPhasicitySpontaneityPropertiesThrombus Aging +---------+---------------+---------+-----------+----------+--------------+ CFV      Full           Yes      Yes                                 +---------+---------------+---------+-----------+----------+--------------+ SFJ      Full                                                        +---------+---------------+---------+-----------+----------+--------------+ FV Prox  Full                                                        +---------+---------------+---------+-----------+----------+--------------+ FV Mid   Full                                                        +---------+---------------+---------+-----------+----------+--------------+ FV DistalFull                                                        +---------+---------------+---------+-----------+----------+--------------+ PFV      Full                                                        +---------+---------------+---------+-----------+----------+--------------+ POP      Full           Yes      Yes                                 +---------+---------------+---------+-----------+----------+--------------+  PTV      Full                                                        +---------+---------------+---------+-----------+----------+--------------+ PERO     Full                                                        +---------+---------------+---------+-----------+----------+--------------+     Summary: Right: There is no evidence of deep vein thrombosis in the lower extremity. Left: There is no evidence of deep vein thrombosis in the lower extremity.  *See table(s) above for measurements and observations.    Preliminary     Cardiac Studies   2D echocardiogram 06/20/2019: IMPRESSIONS    1. Left ventricular ejection fraction, by  visual estimation, is 35 to 40%. The left ventricle has moderately decreased function. There is mildly increased left ventricular hypertrophy.  2. Left ventricular diastolic parameters are consistent with Grade I diastolic dysfunction (impaired relaxation).  3. The left ventricle demonstrates global hypokinesis.  4. Global right ventricle has normal systolic function.The right ventricular size is mildly enlarged. No increase in right ventricular wall thickness.  5. Left atrial size was normal.  6. Right atrial size was normal.  7. Mild mitral annular calcification.  8. The mitral valve is normal in structure. Trace mitral valve regurgitation. No evidence of mitral stenosis.  9. The tricuspid valve is normal in structure. Tricuspid valve regurgitation is trivial. 10. The aortic valve is tricuspid. Aortic valve regurgitation is not visualized. Mild to moderate aortic valve sclerosis/calcification without any evidence of aortic stenosis. 11. The inferior vena cava is normal in size with greater than 50% respiratory variability, suggesting right atrial pressure of 3 mmHg. 12. The tricuspid regurgitant velocity is 2.57 m/s, and with an assumed right atrial pressure of 3 mmHg, the estimated right ventricular systolic pressure is normal at 29.4 mmHg. 13. Technically difficult study on suboptimal machine.  Patient Profile     78 y.o. male with known CAD and chronic intermittent angina, hospitalized with COVID-19  Assessment & Plan    1.  CAD with angina: The patient's troponins were negative x2.  His EKG has shown no acute changes.  Isosorbide is been increased.  He continues on Ranexa.  No recurrent angina today.  The patient had bradycardic events on a beta-blocker and this will be avoided at present.  His heart rate is running on the high side now and I think he should just keep an eye on this.  I reviewed his EKG today and it demonstrates normal sinus rhythm.  He will follow-up with Dr. Rockey Situ as an  outpatient and I will notify Dr. Rockey Situ of the patient's discharge from the hospital.  [facility] will sign off.   Medication Recommendations:  Continue current Rx (isosorbide increased to 60 mg daily).  Other recommendations (labs, testing, etc):  none Follow up as an outpatient:  Dr Rockey Situ  For questions or updates, please contact [facility] Please consult [email or web address] for contact info under        Signed, Sherren Mocha, MD  06/25/2019, 12:37 PM

## 2019-06-25 NOTE — Discharge Instructions (Signed)
Follow with Primary MD Ria Bush, MD in 7 days   Get CBC, CMP,  checked  by Primary MD next visit.    Activity: As tolerated with Full fall precautions use walker/cane & assistance as needed   Disposition Home   Diet: Heart Healthy  , with feeding assistance and aspiration precautions.  For Heart failure patients - Check your Weight same time everyday, if you gain over 2 pounds, or you develop in leg swelling, experience more shortness of breath or chest pain, call your Primary MD immediately. Follow Cardiac Low Salt Diet and 1.5 lit/day fluid restriction.  Please take extra dose of 20 mg Lasix if you have any 3 pounds weight gain.   On your next visit with your primary care physician please Get Medicines reviewed and adjusted.   Please request your Prim.MD to go over all Hospital Tests and Procedure/Radiological results at the follow up, please get all Hospital records sent to your Prim MD by signing hospital release before you go home.   If you experience worsening of your admission symptoms, develop shortness of breath, life threatening emergency, suicidal or homicidal thoughts you must seek medical attention immediately by calling 911 or calling your MD immediately  if symptoms less severe.  You Must read complete instructions/literature along with all the possible adverse reactions/side effects for all the Medicines you take and that have been prescribed to you. Take any new Medicines after you have completely understood and accpet all the possible adverse reactions/side effects.   Do not drive, operating heavy machinery, perform activities at heights, swimming or participation in water activities or provide baby sitting services if your were admitted for syncope or siezures until you have seen by Primary MD or a Neurologist and advised to do so again.  Do not drive when taking Pain medications.    Do not take more than prescribed Pain, Sleep and Anxiety  Medications  Special Instructions: If you have smoked or chewed Tobacco  in the last 2 yrs please stop smoking, stop any regular Alcohol  and or any Recreational drug use.  Wear Seat belts while driving.   Please note  You were cared for by a hospitalist during your hospital stay. If you have any questions about your discharge medications or the care you received while you were in the hospital after you are discharged, you can call the unit and asked to speak with the hospitalist on call if the hospitalist that took care of you is not available. Once you are discharged, your primary care physician will handle any further medical issues. Please note that NO REFILLS for any discharge medications will be authorized once you are discharged, as it is imperative that you return to your primary care physician (or establish a relationship with a primary care physician if you do not have one) for your aftercare needs so that they can reassess your need for medications and monitor your lab values.

## 2019-06-25 NOTE — Discharge Summary (Signed)
[name], is a 78 y.o. male  DOB 1940/12/08  MRN [id number].  Admission date:  06/18/2019  Admitting Physician  Toy Baker, MD  Discharge Date:  06/25/2019   Primary MD  Ria Bush, MD  Recommendations for primary care physician for things to follow:  -Please check CBC, BMP during next visit as patient started on Lasix and losartan. -To follow with cardiology as an outpatient regarding angina at rest, and acute on chronic systolic CHF, his medications has been adjusted, started on losartan, resumed on Ranexa, and Imdur dose was doubled, and started on scheduled Lasix.  Admission Diagnosis  SOB (shortness of breath) [R06.02] 2019 novel coronavirus disease (COVID-19) [U07.1]   Discharge Diagnosis  SOB (shortness of breath) [R06.02] 2019 novel coronavirus disease (COVID-19) [U07.1]    Active Problems:   Hyperlipidemia   Essential hypertension   Carotid stenosis   PVD (peripheral vascular disease) with claudication ([place])   GERD   Pneumonia due to COVID-19 virus      Past Medical History:  Diagnosis Date   BENIGN PROSTATIC HYPERTROPHY, WITH URINARY OBSTRUCTION 09/06/2007   CAD s/p CABG    a. 1990 s/p MI-->CABG x 2; b. 2002 s/p BMS to LCX; c. 06/2015 Cath: LM 50ost, LAD 100ost/p, 44m/d, D2 nl, LCX  patent stent, RCA 80p (small), LIMA->LAD nl, VG->D2 nl-->Med Rx.   Carotid arterial disease ([place])    a. 08/2016 Carotid U/S: <39% bilat.   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 did not tolerate CPAP.   Osteoarthritis    PAD (peripheral artery disease) ([place])    a. 07/2017 LE duplex: RSFA 75-47m, LSFA 75-60m, 50-74d.   Pneumonia 07/2014   [facility] hospitalization   Pre-diabetes    PSA, INCREASED 07/09/2008   Spinal stenosis of lumbar region     Past Surgical History:  Procedure Laterality Date   ABDOMINAL AORTOGRAM W/LOWER EXTREMITY [name] 09/12/2018   Procedure: ABDOMINAL AORTOGRAM W/LOWER EXTREMITY;  Surgeon: [name], MD;  Location: [facility];  Service: Cardiovascular;  Laterality: [name];   APPENDECTOMY     rupture   CARDIAC CATHETERIZATION [name] 07/07/2015   Procedure: Left Heart Cath and Cors/Grafts Angiography;  Surgeon: [name], MD;  Location: [place] [facility];  Service: Cardiovascular;  Laterality: [name];   CATARACT EXTRACTION, BILATERAL     CERVICAL SPINE SURGERY  12/2016   cervical stenosis (  [place])   COLONOSCOPY  03/2010   HP polyp, diverticulosis, rpt 10 yrs ([name])   CORONARY ARTERY BYPASS GRAFT  1990   3 vessel    CYSTOSCOPY  12/23/10   [place]   EYE SURGERY     cataract   KNEE ARTHROSCOPY Right    x2   LAMINOTOMY  1986   L5/S1 lumbar laminotomy for two ruptured discs/fusion   LUMBAR LAMINECTOMY/DECOMPRESSION MICRODISCECTOMY [name] 07/13/2016   Procedure: LUMBAR TWO-THREE, LUMBAR THREE-FOUR, LUMBAR FOUR-FIVE LAMINECTOMY AND FORAMINOTOMY;  Surgeon: [name], MD;  Location: [facility];  Service: Neurosurgery;  Laterality: [name];  LAMINECTOMY AND FORAMINOTOMY L2-L3, L3-L4,L4-L5   TOTAL HIP ARTHROPLASTY Bilateral 1999,2000   TOTAL KNEE ARTHROPLASTY Right 03/06/2017   Procedure: RIGHT TOTAL KNEE ARTHROPLASTY;  Surgeon: Gaynelle Arabian, MD;  Location: [facility];  Service: Orthopedics;  Laterality: Right;       History of present illness and   Hospital Course:     Kindly see H&P for history of present illness and admission details, please review complete Labs, Consult reports and Test reports for all details in brief  HPI  from the history and physical done on the day of admission 06/18/2019  HPI: [name] is a 78 y.o. male with medical history significant of CAD s/p CABG 1990, PVD, prediabetes, OSA not on CPAP, HTN and HLD who was admitted at [facility] 10/15 with covid-19 pneumonia causing hypoxic respiratory failure after initially testing positive in presurgical screening 10/5 story of OSA does not tolerate CPAP    Presented with worsening shortness of breath While at [facility] patient has been treated with remdesivir and steroids he was able to be finally discharged home on 2 L of oxygen 25 October He reports after he went home he initiated did okay but lately has developed worsening shortness of breath he increase his oxygen up to 4 L to feel more comfortable.  On October 28  he started to run another fever up to 101.3 has not had any chest pain or wheezing no headaches. He noticed a little bit of leg swelling but he attributed to not taking diuretics as they were prescribed while he was hospitalized. His blood pressure has been running running low in the 90s so he stopped using his lisinopril.  His blood pressure after that improved to 130s over 60s For his edema his primary care provider prescribed Lasix for him yesterday.  His wife also recently was diagnosed with Covid and has not been doing well.  He is unable to take care of self and his wife also unable to provide him care secondary to illness.  Origally diagnosed on 10/5 when he was prescreened for back surgery   Hospital Course   78 y.o.malewith medical history significant of CAD s/p CABG 1990, PVD, prediabetes, OSA not on CPAP, HTN and HLD who was admitted at [facility] 10/15 with covid-19 pneumonia causing hypoxic respiratory failure after initially testing  positive in presurgical screening 10/5story of OSA does not tolerate CPAP, presented with shortness of breath, with increased oxygen requirement 4 to 2 L nasal cannula, fever 101.3, was transferred to [facility] for further management.   Acute hypoxic respiratory failure -With recent COVID-19 infection, discharged on 2 L nasal cannula, presents with increased oxygen requirement, this does appear to be multifactorial, secondary to mild COPD flare, and evidence of volume overload, and not fully recovered from COVID-19 infection.  -Encouraged to use incentive spirometry, flutter valve, to get out of bed to chair.  COVID-19 pneumonia -  Still evident on CT chest, as well his CRP still limit currently elevated on admission, he was kept on IV steroids during hospital stay, CRP was elevated on admission, but this has normalized by time of discharge . -Continue to trend inflammatory markers, D-dimers trending up, will monitor closely, venous duplex is negative for DVT . COVID-19 Labs  Recent Labs (last 2 labs)        Recent Labs    06/22/19 0003 06/23/19 0324 06/24/19 0233  DDIMER 0.81* 1.15*  --   CRP 2.7* 1.3* 1.0*      Recent Labs       Lab Results  Component Value Date   SARSCOV2NAA POSITIVE (A) 05/31/2019   SARSCOV2NAA POSITIVE (A) 05/20/2019     Acute on chronic systolic/diastolic CHF -2D echo showing evidence of grade 1 diastolic dysfunction, and low EF 35 to 40%, (patient with recent stress test, low risk, 38% EF) , and was on IV Lasix during hospital stay, he required low-dose  20 mg of IV Lasix, with good results, he is -10.5 L during hospital stay, so will be discharged on Lasix 20 mg oral daily. -Tried low-dose metoprolol, which has been discontinued given significant symptomatic bradycardia .-Started low-dose losartan, which she has been tolerating very well -Continue with Imdur.  Nonsustained V. Tach -Patient had 6 beats of V. tach on 11/8,magnesium and  potassium are appropriate, cannot start any beta-blockers given symptomatic bradycardia even on very low-dose metoprolol.  But patient baseline heart rate has increased after he  Imdur was increased, and resumed on Ranexa, heart rate at rest currently in the 80s to 90s, if deemed appropriate by cardiology he can be resumed .  Chest pain/CAD/angina -Patient with known history of CAD, recent stress test last August significant for low risk, fixed defect, and EF 38%. -Continue with aspirin and statin, unable to tolerate beta-blockers, cardiology consult appreciated, was started on losartan, his Imdur was increased from 30 to 60 mg, resumed on Ranexa,. -Patient with midsternal chest pain during hospital stay, appears cardiac,, resolved with nitro x3, EKG nonacute, f chest troponins negative x2, he received full dose aspirin, no recurrence  COPD exacerbation -No further wheezing, no further indication for steroids  Diabetes mellitus -Flu sliding scale during hospital stay  Hypertension -Pressure acceptable, continue with Imdur, Toprol has been stopped given bradycardia, to need to monitor closely as he was started on losartan as well.  Imdur was increased on discharge, and started on low-dose Lasix  Hyperlipidemia -Continue with statin  CAD -Denies any chest pain, please see above discussion under CHF, resume on aspirin     Discharge Condition:  stable   Follow UP  Follow-up Information    Ria Bush, MD Follow up in 1 week(s).   Specialty: Family Medicine Contact information: [place] [address] [phone]        [name], MD .   Specialty: Cardiology Contact information: [place] [address] [phone]             Discharge Instructions  and  Discharge Medications    Discharge Instructions    Discharge instructions   Complete by: As directed    Follow with Primary MD Ria Bush, MD in 7 days   Get CBC, CMP,  checked  by Primary MD next visit.    Activity: As tolerated with Full fall precautions use walker/cane & assistance as needed   Disposition Home   Diet: Heart  Healthy  , with feeding assistance and aspiration precautions.  For Heart failure patients - Check your Weight same time everyday, if you gain over 2 pounds, or you develop in leg swelling, experience more shortness of breath or chest pain, call your Primary MD immediately. Follow Cardiac Low Salt Diet and 1.5 lit/day fluid restriction.  Please take extra dose of 20 mg Lasix if you have any 3 pounds weight gain.   On your next visit with your primary care physician please Get Medicines reviewed and adjusted.   Please request your Prim.MD to go over all Hospital Tests and Procedure/Radiological results at the follow up, please get all Hospital records sent to your Prim MD by signing hospital release before you go home.   If you experience worsening of your admission symptoms, develop shortness of breath, life threatening emergency, suicidal or homicidal thoughts you must seek medical attention immediately by calling 911 or calling your MD immediately  if symptoms less severe.  You Must read complete instructions/literature along with all the possible adverse reactions/side effects for all the Medicines you take and that have been prescribed to you. Take any new Medicines after you have completely understood and accpet all the possible adverse reactions/side effects.   Do not drive, operating heavy machinery, perform activities at heights, swimming or participation in water activities or provide baby sitting services if your were admitted for syncope or siezures until you have seen by Primary MD or a Neurologist and advised to do so again.  Do not drive when taking Pain medications.    Do not take more than prescribed Pain, Sleep and Anxiety Medications  Special Instructions: If you have  smoked or chewed Tobacco  in the last 2 yrs please stop smoking, stop any regular Alcohol  and or any Recreational drug use.  Wear Seat belts while driving.   Please note  You were cared for by a hospitalist during your hospital stay. If you have any questions about your discharge medications or the care you received while you were in the hospital after you are discharged, you can call the unit and asked to speak with the hospitalist on call if the hospitalist that took care of you is not available. Once you are discharged, your primary care physician will handle any further medical issues. Please note that NO REFILLS for any discharge medications will be authorized once you are discharged, as it is imperative that you return to your primary care physician (or establish a relationship with a primary care physician if you do not have one) for your aftercare needs so that they can reassess your need for medications and monitor your lab values.   Increase activity slowly   Complete by: As directed      Allergies as of 06/25/2019      Reactions   Vioxx [rofecoxib] Other (See Comments)   Hemorrhage    Contrast Media [iodinated Diagnostic Agents] Itching, Rash   Delayed reaction post abdominal aortagram.    Morphine Nausea Only, Other (See Comments)   Irritability      Medication List    STOP taking these medications   HYDROcodone-homatropine 5-1.5 MG/5ML syrup Commonly known as: HYCODAN     TAKE these medications   acetaminophen 650 MG CR tablet Commonly known as: TYLENOL Take 1,300 mg by mouth every 8 (eight) hours as needed for pain.   albuterol 108 (90 Base) MCG/ACT inhaler Commonly known as: VENTOLIN HFA Inhale 2 puffs into the lungs every 6 (six) hours  as needed for wheezing or shortness of breath.   aspirin 81 MG EC tablet Take 1 tablet (81 mg total) by mouth daily. Start taking on: June 26, 2019   B-12 1000 MCG Subl Place 1 tablet under the tongue daily. What  changed: when to take this   ezetimibe 10 MG tablet Commonly known as: ZETIA TAKE ONE TABLET BY MOUTH EVERY DAY What changed: when to take this   furosemide 20 MG tablet Commonly known as: LASIX Take 1 tablet (20 mg total) by mouth daily. What changed:   when to take this  reasons to take this   gabapentin 300 MG capsule Commonly known as: NEURONTIN Take 300 mg by mouth 3 (three) times daily.   isosorbide mononitrate 60 MG 24 hr tablet Commonly known as: IMDUR Take 1 tablet (60 mg total) by mouth daily. Start taking on: June 26, 2019 What changed:   medication strength  how much to take  when to take this   ketoconazole 2 % cream Commonly known as: NIZORAL Apply 1 application topically daily as needed for irritation.   losartan 25 MG tablet Commonly known as: COZAAR Take 1 tablet (25 mg total) by mouth daily. Start taking on: June 26, 2019   nitroGLYCERIN 0.4 MG SL tablet Commonly known as: NITROSTAT Place 1 tablet (0.4 mg total) under the tongue every 5 (five) minutes as needed for chest pain.   polyethylene glycol 17 g packet Commonly known as: MIRALAX / GLYCOLAX Take 17 g by mouth every other day.   ranolazine 500 MG 12 hr tablet Commonly known as: RANEXA Take 1 tablet (500 mg total) by mouth 2 (two) times daily.   simvastatin 40 MG tablet Commonly known as: ZOCOR TAKE ONE TABLET BY MOUTH EVERY DAY What changed: when to take this   tolterodine 4 MG 24 hr capsule Commonly known as: DETROL LA Take 4 mg by mouth daily.         Diet and Activity recommendation: See Discharge Instructions above   Consults obtained - Cardiolgoy Dr Burt Knack   Major procedures and Radiology Reports - PLEASE review detailed and final reports for all details, in brief -      Ct Angio Chest Pe W/cm &/or Wo Cm  Result Date: 06/18/2019 CLINICAL DATA:  COVID-19 positive, worsening shortness of breath and increasing O2 requirement, EXAM: CT ANGIOGRAPHY CHEST  WITH CONTRAST TECHNIQUE: Multidetector CT imaging of the chest was performed using the standard protocol during bolus administration of intravenous contrast. Multiplanar CT image reconstructions and MIPs were obtained to evaluate the vascular anatomy. CONTRAST:  59mL OMNIPAQUE IOHEXOL 350 MG/ML SOLN COMPARISON:  Radiograph 06/18/2019, CT 07/03/2013 FINDINGS: Cardiovascular: Evaluation of the lungs beyond the lobar level is limited due to respiratory motion artifact. Satisfactory opacification of the pulmonary arteries without central or lobar filling defect. No pulmonary artery filling defects are identified. Central pulmonary arteries are borderline enlarged but without elevation of the RV/LV ratio (0.8). Patient is post CABG with mediastinal surgical clips and postsurgical changes in the anterior mediastinum. Calcification of the native coronary arteries is noted. No pericardial effusion. The normal caliber thoracic aorta and proximal great vessels are calcified. No acute luminal irregularity is seen. No periaortic stranding. Mediastinum/Nodes: Low-attenuation mediastinal and hilar lymph nodes are nonenlarged but likely reactive. No axillary adenopathy. Thyroid gland and thoracic inlet are unremarkable. No acute abnormality of the trachea or esophagus. Lungs/Pleura: Mild central airways thickening. Diffuse multifocal areas of consolidation and ground-glass opacity throughout the lungs compatible with atypical  infection in the setting of known COVID-19 positivity. Small bilateral effusions are present. No abnormal pleural enhancement or thickening to suggest developing empyema at this time. No pneumothorax. Upper Abdomen: Stable 16 mm hypoattenuating structure in the left lobe liver, unchanged since 2014. Likely benign cyst or hemangioma. No acute abnormalities present in the visualized portions of the upper abdomen. Musculoskeletal: Multiple intact sternal sutures are noted. There is bony fusion across the  sternotomy site. Multilevel degenerative changes are present in the imaged portions of the spine. No acute osseous abnormality or suspicious osseous lesion. No suspicious chest wall lesion. Review of the MIP images confirms the above findings. IMPRESSION: 1. No evidence of central or lobar pulmonary embolism. Evaluation of the lungs beyond the lobar level is limited due to respiratory motion artifact. 2. Diffuse multifocal areas of consolidation and ground-glass opacity throughout the lungs compatible with atypical infection in the setting of known COVID positivity. 3. Small bilateral effusions. No abnormal pleural enhancement or thickening to suggest developing empyema at this time. 4. Stable 16 mm hypoattenuating focus in the left lobe liver, favor benign cyst or hemangioma. 5.  Aortic Atherosclerosis (ICD10-I70.0). 6. Prior CABG. Electronically Signed   By: Lovena Le M.D.   On: 06/18/2019 22:26   Dg Chest Port 1 View  Result Date: 06/18/2019 CLINICAL DATA:  Shortness of breath. EXAM: PORTABLE CHEST 1 VIEW COMPARISON:  06/02/2019. FINDINGS: Prior CABG. Cardiomegaly. No pulmonary venous congestion. Multifocal bilateral pulmonary infiltrates with slight improvement from prior exam. No pleural effusion or pneumothorax IMPRESSION: 1. Multifocal bilateral pulmonary infiltrates with slight improvement from prior exam. 2.  Prior CABG.  Cardiomegaly.  No pulmonary venous congestion. Electronically Signed   By: Marcello Moores  Register   On: 06/18/2019 14:03   Dg Chest Port 1 View  Result Date: 06/02/2019 CLINICAL DATA:  78 year old male with history of chest pain. COVID positive patient. EXAM: PORTABLE CHEST 1 VIEW COMPARISON:  Chest x-ray 05/30/2019. FINDINGS: Lung volumes are low. Patchy multifocal interstitial and airspace disease scattered asymmetrically throughout the lungs bilaterally, increased compared to the prior study, most confluent in the left mid to lower lung. No pleural effusions. No evidence of  pulmonary edema. Heart size is normal. Upper mediastinal contours are within normal limits. Status post median sternotomy. IMPRESSION: 1. Increasing severe multilobar bilateral pneumonia, as above. Electronically Signed   By: Vinnie Langton M.D.   On: 06/02/2019 18:04   Dg Chest Portable 1 View  Result Date: 05/30/2019 CLINICAL DATA:  Shortness of breath, fever. EXAM: PORTABLE CHEST 1 VIEW COMPARISON:  May 28, 2019. FINDINGS: The heart size and mediastinal contours are within normal limits. No pneumothorax or pleural effusion is noted. Increased left midlung and basilar opacities are noted suggesting worsening atelectasis or pneumonia. Stable right lung atelectasis or inflammation. The visualized skeletal structures are unremarkable. IMPRESSION: Increased left lung opacity is noted suggesting worsening atelectasis or pneumonia. Stable right lung atelectasis or inflammation is noted. Electronically Signed   By: Marijo Conception M.D.   On: 05/30/2019 09:54   Dg Chest Portable 1 View  Result Date: 05/28/2019 CLINICAL DATA:  COVID positive.  Fever, fatigue EXAM: PORTABLE CHEST 1 VIEW COMPARISON:  05/24/2019 FINDINGS: Streaky opacities at the left base, slightly increased since prior study. Improved aeration at the right base with no confluent opacity currently. Heart is normal size. No effusions or acute bony abnormality. IMPRESSION: Increasing streaky opacity at the left lung base with improving aeration at the right base. Electronically Signed   By: Rolm Baptise  M.D.   On: 05/28/2019 09:33   Vas [place] Lower Extremity Venous (dvt)  Result Date: 06/23/2019  Lower Venous Study Indications: SOB, Edema, and Covid positive.  Comparison Study: No prior study on file Performing Technologist: Sharion Dove RVS  Examination Guidelines: A complete evaluation includes B-mode imaging, spectral Doppler, color Doppler, and power Doppler as needed of all accessible portions of each vessel. Bilateral testing is  considered an integral part of a complete examination. Limited examinations for reoccurring indications may be performed as noted.  +---------+---------------+---------+-----------+----------+--------------+  RIGHT     Compressibility Phasicity Spontaneity Properties Thrombus Aging  +---------+---------------+---------+-----------+----------+--------------+  CFV       Full            Yes       Yes                                    +---------+---------------+---------+-----------+----------+--------------+  SFJ       Full                                                             +---------+---------------+---------+-----------+----------+--------------+  FV Prox   Full                                                             +---------+---------------+---------+-----------+----------+--------------+  FV Mid    Full                                                             +---------+---------------+---------+-----------+----------+--------------+  FV Distal Full                                                             +---------+---------------+---------+-----------+----------+--------------+  PFV       Full                                                             +---------+---------------+---------+-----------+----------+--------------+  POP       Full            Yes       Yes                                    +---------+---------------+---------+-----------+----------+--------------+  PTV       Full                                                             +---------+---------------+---------+-----------+----------+--------------+  PERO      Full                                                             +---------+---------------+---------+-----------+----------+--------------+   +---------+---------------+---------+-----------+----------+--------------+  LEFT      Compressibility Phasicity Spontaneity Properties Thrombus Aging   +---------+---------------+---------+-----------+----------+--------------+  CFV       Full            Yes       Yes                                    +---------+---------------+---------+-----------+----------+--------------+  SFJ       Full                                                             +---------+---------------+---------+-----------+----------+--------------+  FV Prox   Full                                                             +---------+---------------+---------+-----------+----------+--------------+  FV Mid    Full                                                             +---------+---------------+---------+-----------+----------+--------------+  FV Distal Full                                                             +---------+---------------+---------+-----------+----------+--------------+  PFV       Full                                                             +---------+---------------+---------+-----------+----------+--------------+  POP       Full            Yes       Yes                                    +---------+---------------+---------+-----------+----------+--------------+  PTV       Full                                                             +---------+---------------+---------+-----------+----------+--------------+  PERO      Full                                                             +---------+---------------+---------+-----------+----------+--------------+     Summary: Right: There is no evidence of deep vein thrombosis in the lower extremity. Left: There is no evidence of deep vein thrombosis in the lower extremity.  *See table(s) above for measurements and observations.    Preliminary     Micro Results    Recent Results (from the past 240 hour(s))  Blood culture (routine x 2)     Status: None   Collection Time: 06/18/19  3:28 PM   Specimen: BLOOD  Result Value Ref Range Status   Specimen Description BLOOD BLOOD LEFT FOREARM  Final    Special Requests   Final    BOTTLES DRAWN AEROBIC AND ANAEROBIC Blood Culture results may not be optimal due to an inadequate volume of blood received in culture bottles   Culture   Final    NO GROWTH 5 DAYS Performed at [facility], [place] [address], [place], [place] [address]    Report Status 06/23/2019 FINAL  Final  Blood culture (routine x 2)     Status: None   Collection Time: 06/18/19 10:19 PM   Specimen: BLOOD  Result Value Ref Range Status   Specimen Description BLOOD LEFT ANTECUBITAL  Final   Special Requests   Final    BOTTLES DRAWN AEROBIC AND ANAEROBIC Blood Culture adequate volume   Culture   Final    NO GROWTH 5 DAYS Performed at [facility], [place] [address], [place], [place] [address]    Report Status 06/23/2019 FINAL  Final       Today   Subjective:   [name] today has no headache,no chest abdominal pain,no new weakness tingling or numbness, feels much better wants to go home today.  Objective:   Blood pressure (!) 121/59, pulse 91, temperature 98.1 F (36.7 C), temperature source Oral, resp. rate 17, height 6' (1.829 m), weight 90.5 kg, SpO2 100 %.   Intake/Output Summary (Last 24 hours) at 06/25/2019 1709 Last data filed at 06/25/2019 1607 Gross per 24 hour  Intake 843 ml  Output 2400 ml  Net -1557 ml    Exam Awake Alert, Oriented x 3, No new F.N deficits, Normal affect Symmetrical Chest wall movement, Good air movement bilaterally, CTAB RRR,No Gallops,Rubs or new Murmurs, No Parasternal Heave +ve B.Sounds, Abd Soft, Non tender, No organomegaly appriciated, No rebound -guarding or rigidity. No Cyanosis, Clubbing or edema, No new Rash or bruise  Data Review   CBC w Diff:  Lab Results  Component Value Date   WBC 6.0 06/25/2019   HGB 11.1 (L) 06/25/2019   HGB 14.0 08/31/2018   HCT 33.2 (L) 06/25/2019   HCT 39.5 08/31/2018   PLT 293 06/25/2019   PLT 181 08/31/2018   LYMPHOPCT 18 06/23/2019   MONOPCT 9 06/23/2019    EOSPCT 1 06/23/2019   BASOPCT 0 06/23/2019    CMP:  Lab Results  Component Value Date   NA 138 06/25/2019   NA 144 08/31/2018   NA 138 08/10/2014   K 4.1 06/25/2019   K 4.2 08/10/2014   CL 101 06/25/2019   CL 104 08/10/2014  CO2 26 06/25/2019   CO2 25 08/10/2014   BUN 37 (H) 06/25/2019   BUN 19 08/31/2018   BUN 25 (H) 08/10/2014   CREATININE 1.02 06/25/2019   CREATININE 1.26 08/10/2014   PROT 5.8 (L) 06/23/2019   PROT 6.5 05/14/2013   ALBUMIN 2.6 (L) 06/23/2019   ALBUMIN 3.6 05/14/2013   BILITOT 0.5 06/23/2019   BILITOT 0.3 05/14/2013   ALKPHOS 44 06/23/2019   ALKPHOS 48 (L) 05/14/2013   AST 39 06/23/2019   AST 24 05/14/2013   ALT 71 (H) 06/23/2019   ALT 20 05/14/2013  .   Total Time in preparing paper work, data evaluation and todays exam - 44 minutes  Phillips Climes M.D on 06/25/2019 at 5:09 PM  [facility]   Office  [phone]

## 2019-06-25 NOTE — Plan of Care (Signed)
  Problem: Respiratory: Goal: Will maintain a patent airway Outcome: Progressing Goal: Complications related to the disease process, condition or treatment will be avoided or minimized Outcome: Progressing   

## 2019-06-25 NOTE — Progress Notes (Signed)
Occupational Therapy Treatment Patient Details Name: [name] MRN: [id number] DOB: 1941/07/27 Today's Date: 06/25/2019    History of present illness 78 yo male presenting with SOB and increased O2 requirements. Pt with recent COVID + test , at [facility], and dc to home (10/15-10/26).  PMH including lumbar spinal stenosis, PSA, PNA, PAD, OA, OSA, MI, lumbar disc disease w/ radiculopathy, HTN, HLD, GERD, DVT, CAD, s/p CABG. BPH,  R TKA, B THA, Lumbar lami, cervical spine surgery, cardiac cath.    OT comments  Upon arrival, pt was sitting in recliner with HR of 109. Focused session on [facility] education while seated. Provided handout and reviewed in full; pt verbalized ways to implement into daily routine. Provided pt with theraband and exercises handout for continued strengthening while remaining seated/supine.    Follow Up Recommendations  No OT follow up;Supervision - Intermittent    Equipment Recommendations  None recommended by OT    Recommendations for Other Services      Precautions / Restrictions Precautions Precautions: Fall Restrictions Weight Bearing Restrictions: No       Mobility Bed Mobility               General bed mobility comments: Sitting in recliner upon arrival  Transfers                      Balance Overall balance assessment: Needs assistance Sitting-balance support: Feet unsupported Sitting balance-Leahy Scale: Good                                     ADL either performed or assessed with clinical judgement   ADL Overall ADL's : Needs assistance/impaired                                       General ADL Comments: Defered ADL and mobility at this time due to resting HR in 100s     Vision       Perception     Praxis      Cognition Arousal/Alertness: Awake/alert Behavior During Therapy: WFL for tasks assessed/performed Overall Cognitive Status: Within Functional Limits for tasks assessed                                          Exercises Exercises: Other exercises Other Exercises Other Exercises: Provided pt with handout and education on EC for ADLs and IADLs. Discussed ways pt already impliments EC into daily routine and new ways to apply then into everyday.  Other Exercises: Provided pt with theraband and UE exercise handout to continue strengthening despite HR and needing to maintain sitting/supine   Shoulder Instructions       General Comments SpO2 >90% on RA. HR 90-100s resting. RR 10-20s.    Pertinent Vitals/ Pain       Pain Assessment: No/denies pain  Home Living                                          Prior Functioning/Environment              Frequency  Min 2X/week  Progress Toward Goals  OT Goals(current goals can now be found in the care plan section)  Progress towards OT goals: Progressing toward goals  Acute Rehab OT Goals Patient Stated Goal: "Get out of here" OT Goal Formulation: With patient Time For Goal Achievement: 07/07/19 Potential to Achieve Goals: Good ADL Goals Pt Will Perform Grooming: Independently;standing Pt Will Transfer to Toilet: Independently;ambulating;bedside commode Pt Will Perform Toileting - Clothing Manipulation and hygiene: Independently;sit to/from stand;sitting/lateral leans Additional ADL Goal #1: Pt will independently verbalize three energy conservation strategies for ADLs/IADLs  Plan Discharge plan remains appropriate    Co-evaluation                 AM-PAC OT "6 Clicks" Daily Activity     Outcome Measure   Help from another person eating meals?: None Help from another person taking care of personal grooming?: None Help from another person toileting, which includes using toliet, bedpan, or urinal?: None Help from another person bathing (including washing, rinsing, drying)?: None Help from another person to put on and taking off regular upper body  clothing?: None Help from another person to put on and taking off regular lower body clothing?: None 6 Click Score: 24    End of Session    OT Visit Diagnosis: Unsteadiness on feet (R26.81);Other abnormalities of gait and mobility (R26.89);Muscle weakness (generalized) (M62.81)   Activity Tolerance Patient tolerated treatment well   Patient Left in chair;with call bell/phone within reach(with ultrasound)   Nurse Communication Mobility status        Time: [id number] OT Time Calculation (min): 20 min  Charges: OT General Charges $OT Visit: 1 Visit OT Treatments $Self Care/Home Management : 8-22 mins  [place], OTR/L Acute Rehab Pager: [phone] Office: [place] 06/25/2019, 3:49 PM

## 2019-06-26 ENCOUNTER — Telehealth: Payer: Self-pay

## 2019-06-26 ENCOUNTER — Telehealth: Payer: Self-pay | Admitting: Cardiovascular Disease

## 2019-06-26 NOTE — Telephone Encounter (Signed)
-----   Message from [name], MD sent at 06/25/2019  8:15 PM EST ----- Regarding: TCM Needs follow-up in the clinic as detailed below  Thx TG ----- Message ----- From: Sherren Mocha, MD Sent: 06/25/2019   5:38 PM EST To: [name], MD  Hey Tim - he's being DC'd from [facility] today. Doing pretty well. Concerned that his HR is elevated but I think he's doing ok - sinus rhythm on EKG. Can you arrange OP follow-up for him?  Thx - Coop

## 2019-06-26 NOTE — Telephone Encounter (Signed)
Message fwd to scheduling to schedule

## 2019-06-26 NOTE — Telephone Encounter (Signed)
Spoke with patient and reviewed medication but he does have the other dose now. Advised that when he comes in for his appointment Dr. Rockey Situ can review his medications and dosages for him since he already has new script with higher dose. He had no further questions and confirmed upcoming appointment.

## 2019-06-26 NOTE — Telephone Encounter (Signed)
Scheduled 11/17 for tcm visit Dr. Rockey Situ   Pt c/o medication issue:  1. Name of Medication: Isosorbide   2. How are you currently taking this medication (dosage and times per day)? 60 mg po q d   3. Are you having a reaction (difficulty breathing--STAT)? No   4. What is your medication issue?  Patient has meds at home from previous rx  Of 30 mg po BID and hospital dc rx is for 60 mg po q d   Patient has plenty of the 30 mg tablets at home and wants to know was there a reason the dose was changed . He is having to go get the new rx and pay again for meds he already has.  Please call to discuss .

## 2019-06-26 NOTE — Telephone Encounter (Signed)
Pt c/o medication issue:  1. Name of Medication: losartan   2. How are you currently taking this medication (dosage and times per day)? 25 MG 1 tablet daily   3. Are you having a reaction (difficulty breathing--STAT)? Sluggish   4. What is your medication issue? Patient calling, patient has been having low BP (102/49 and 103/50) and would like to know if he should still take this medication.  His oxygen level is good.  Please call to discuss.

## 2019-06-26 NOTE — Telephone Encounter (Signed)
Spoke with patient and reviewed that blood pressures were still considered normal. Discussed blood pressure numbers and requested that he keep a log from now until his upcoming appointment. Reviewed that he has been sick and it will take time for him to build back up his strength. We talked about his recent long hospital stay and recovery from [facility]. He was very understanding to continue monitoring blood pressures, heart rates, and to bring that in to his appointment. Also advised to call back if any further concerns. He was very appreciative for the call back and reassurance. Confirmed appointment with him and no further concerns at this time.

## 2019-07-01 ENCOUNTER — Ambulatory Visit (INDEPENDENT_AMBULATORY_CARE_PROVIDER_SITE_OTHER): Payer: PPO | Admitting: Family Medicine

## 2019-07-01 ENCOUNTER — Encounter: Payer: Self-pay | Admitting: Family Medicine

## 2019-07-01 VITALS — BP 115/57 | HR 88 | Temp 97.8°F | Ht 72.0 in | Wt 196.4 lb

## 2019-07-01 DIAGNOSIS — J9600 Acute respiratory failure, unspecified whether with hypoxia or hypercapnia: Secondary | ICD-10-CM

## 2019-07-01 DIAGNOSIS — U071 COVID-19: Secondary | ICD-10-CM | POA: Diagnosis not present

## 2019-07-01 DIAGNOSIS — E118 Type 2 diabetes mellitus with unspecified complications: Secondary | ICD-10-CM

## 2019-07-01 DIAGNOSIS — R600 Localized edema: Secondary | ICD-10-CM

## 2019-07-01 DIAGNOSIS — J1289 Other viral pneumonia: Secondary | ICD-10-CM

## 2019-07-01 DIAGNOSIS — R079 Chest pain, unspecified: Secondary | ICD-10-CM | POA: Diagnosis not present

## 2019-07-01 DIAGNOSIS — I10 Essential (primary) hypertension: Secondary | ICD-10-CM

## 2019-07-01 NOTE — Assessment & Plan Note (Signed)
He has stopped losartan due to low blood pressures. Will continue to monitor.

## 2019-07-01 NOTE — Assessment & Plan Note (Signed)
Deteriorated after steroid courses during recent hospitalization, now endorses improving sugar readings at home - will continue to monitor off med.

## 2019-07-01 NOTE — Progress Notes (Addendum)
Virtual visit completed through [employer]. Due to national recommendations of social distancing due to COVID-19, a virtual visit is felt to be most appropriate for this patient at this time. Reviewed limitations of a virtual visit.   Patient location: home Provider location: [place] at [facility], office If any vitals were documented, they were collected by patient at home unless specified below.    BP (!) 115/57   Pulse 88   Temp 97.8 F (36.6 C)   Ht 6' (1.829 m)   Wt 196 lb 7 oz (89.1 kg)   SpO2 95%   BMI 26.64 kg/m    CC: hosp f/u visit Subjective:    Patient ID: [name], male    DOB: 20-Jun-1941, 78 y.o.   MRN: [id number]  HPI: [name] is a 78 y.o. male presenting on 07/01/2019 for Hospitalization Follow-up (States BS 114 this morning. )   Recent re hospitalization after covid19 pneumonia s/p initial recovery then with recurrent dyspnea, pedal edema, hypoxia and fever. Readmitted 06/18/2019, back to [facility] with increased oxygen requirement to 4L [place]. Treated for COPD exacerbation and acute CHF exacerbation. Cardiology was consulted in hospital for acute chest pain thought angina however workup was reassuringly normal. Was weaned off supplemental oxygen.   Had echocardiogram showing EF 35-40%, mod decreased LV fxn, G1DD, global LV hypokinesis, normal R heart size/function. Mild-mod aortic sclerosis without stenosis.   Started on losartan 25mg  daily - he stopped this due to hypotension. He is now on lasix 20mg  daily. He continues imdur 60mg  total (prior on 30mg  BID).   Since home feeling better, no further dyspnea, off of supplemental oxygen completely.  Wife also with Covid, she has recovered as well.  He has been started on lasix 20mg  daily. Last night he diuresed 46 oz urine.  Cardiology appt scheduled for tomorrow.  DM - largely uncontrolled in hospital, now cbg's closer to normal values.   Admission date:  06/18/2019   Discharge Date:   06/25/2019  TCM hosp f/u phone call not performed.  Recommendations for primary care physician for things to follow:  -Please check CBC, BMP during next visit as patient started on Lasix and losartan. -To follow with cardiology as an outpatient regarding angina at rest, and acute on chronic systolic CHF, his medications has been adjusted, started on losartan, resumed on Ranexa, and Imdur dose was doubled, and started on scheduled Lasix.  Discharge Diagnosis  SOB (shortness of breath) [R06.02] 2019 novel coronavirus disease (COVID-19) [U07.1]    Active Problems:   Hyperlipidemia   Essential hypertension   Carotid stenosis   PVD (peripheral vascular disease) with claudication (HCC)   GERD   Pneumonia due to COVID-19 virus     Relevant past medical, surgical, family and social history reviewed and updated as indicated. Interim medical history since our last visit reviewed. Allergies and medications reviewed and updated. Outpatient Medications Prior to Visit  Medication Sig Dispense Refill  . acetaminophen (TYLENOL) 650 MG CR tablet Take 1,300 mg by mouth every 8 (eight) hours as needed for pain.    Marland Kitchen albuterol (VENTOLIN HFA) 108 (90 Base) MCG/ACT inhaler Inhale 2 puffs into the lungs every 6 (six) hours as needed for wheezing or shortness of breath. 18 g 3  . aspirin EC 81 MG EC tablet Take 1 tablet (81 mg total) by mouth daily. 30 tablet 0  . Cyanocobalamin (B-12) 1000 MCG SUBL Place 1 tablet under the tongue daily. (Patient taking differently: Place 1  tablet under the tongue daily with lunch. ) 30 each   . ezetimibe (ZETIA) 10 MG tablet TAKE ONE TABLET BY MOUTH EVERY DAY (Patient taking differently: Take 10 mg by mouth at bedtime. ) 90 tablet 3  . furosemide (LASIX) 20 MG tablet Take 1 tablet (20 mg total) by mouth daily. 30 tablet 0  . gabapentin (NEURONTIN) 300 MG capsule Take 300 mg by mouth 3 (three) times daily.     . isosorbide mononitrate (IMDUR) 60 MG 24 hr tablet Take 1 tablet  (60 mg total) by mouth daily. 30 tablet 0  . ketoconazole (NIZORAL) 2 % cream Apply 1 application topically daily as needed for irritation.     . nitroGLYCERIN (NITROSTAT) 0.4 MG SL tablet Place 1 tablet (0.4 mg total) under the tongue every 5 (five) minutes as needed for chest pain. 25 tablet 3  . polyethylene glycol (MIRALAX / GLYCOLAX) 17 g packet Take 17 g by mouth every other day.     . ranolazine (RANEXA) 500 MG 12 hr tablet Take 1 tablet (500 mg total) by mouth 2 (two) times daily. 60 tablet 0  . simvastatin (ZOCOR) 40 MG tablet TAKE ONE TABLET BY MOUTH EVERY DAY (Patient taking differently: Take 40 mg by mouth at bedtime. ) 90 tablet 3  . tolterodine (DETROL LA) 4 MG 24 hr capsule Take 4 mg by mouth daily.    Marland Kitchen losartan (COZAAR) 25 MG tablet Take 1 tablet (25 mg total) by mouth daily. (Patient not taking: Reported on 07/01/2019) 30 tablet 0   No facility-administered medications prior to visit.      Per HPI unless specifically indicated in ROS section below Review of Systems Objective:    BP (!) 115/57   Pulse 88   Temp 97.8 F (36.6 C)   Ht 6' (1.829 m)   Wt 196 lb 7 oz (89.1 kg)   SpO2 95%   BMI 26.64 kg/m   Wt Readings from Last 3 Encounters:  07/01/19 196 lb 7 oz (89.1 kg)  06/23/19 199 lb 8 oz (90.5 kg)  06/10/19 203 lb (92.1 kg)     Physical exam: Gen: alert, NAD, not ill appearing Pulm: speaks in complete sentences without increased work of breathing Psych: normal mood, normal thought content      Assessment & Plan:  Will return in 3 months for diabetes f/u visit  Lab work not obtained for this virtual visit.  Problem List Items Addressed This Visit    RESOLVED: Pneumonia due to COVID-19 virus    This has resolved.      Pedal edema    This resolved during hospitalization, now on daily lasix 20mg .      Essential hypertension    He has stopped losartan due to low blood pressures. Will continue to monitor.       Controlled diabetes mellitus type 2 with  complications ([place])    Deteriorated after steroid courses during recent hospitalization, now endorses improving sugar readings at home - will continue to monitor off med.       Chest pain - Primary    Possible recent anginal chest pain during latest hospitalization with mild acute CHF s/p diuresis. Reassuring workup in hospital, chest pain quickly resolved while hospitalized. Planned close cards f/u tomorrow. Labs not checked today (virtual visit). He continues ranexa and imdur as well as aspirin daily. Was unable to tolerate losartan due to low blood pressures reading.       RESOLVED: Acute respiratory failure due to  COVID-19 ([place])    Resolved.           No orders of the defined types were placed in this encounter.  No orders of the defined types were placed in this encounter.   I discussed the assessment and treatment plan with the patient. The patient was provided an opportunity to ask questions and all were answered. The patient agreed with the plan and demonstrated an understanding of the instructions. The patient was advised to call back or seek an in-person evaluation if the symptoms worsen or if the condition fails to improve as anticipated.  Follow up plan: No follow-ups on file.  Ria Bush, MD

## 2019-07-01 NOTE — Assessment & Plan Note (Signed)
Resolved

## 2019-07-01 NOTE — Assessment & Plan Note (Signed)
This resolved during hospitalization, now on daily lasix 20mg .

## 2019-07-01 NOTE — Assessment & Plan Note (Addendum)
Possible recent anginal chest pain during latest hospitalization with mild acute CHF s/p diuresis. Reassuring workup in hospital, chest pain quickly resolved while hospitalized. Planned close cards f/u tomorrow. Labs not checked today (virtual visit). He continues ranexa and imdur as well as aspirin daily. Was unable to tolerate losartan due to low blood pressures reading.

## 2019-07-01 NOTE — Assessment & Plan Note (Signed)
This has resolved.

## 2019-07-02 ENCOUNTER — Other Ambulatory Visit: Payer: Self-pay

## 2019-07-02 ENCOUNTER — Encounter: Payer: Self-pay | Admitting: Cardiovascular Disease

## 2019-07-02 ENCOUNTER — Ambulatory Visit (INDEPENDENT_AMBULATORY_CARE_PROVIDER_SITE_OTHER): Payer: PPO | Admitting: Cardiovascular Disease

## 2019-07-02 VITALS — BP 130/72 | HR 94 | Ht 72.0 in | Wt 199.8 lb

## 2019-07-02 DIAGNOSIS — R0602 Shortness of breath: Secondary | ICD-10-CM | POA: Diagnosis not present

## 2019-07-02 DIAGNOSIS — I25810 Atherosclerosis of coronary artery bypass graft(s) without angina pectoris: Secondary | ICD-10-CM | POA: Diagnosis not present

## 2019-07-02 MED ORDER — METOPROLOL SUCCINATE ER 25 MG PO TB24: 12.5000 mg | ORAL_TABLET | Freq: Every day | ORAL | 3 refills | Status: DC

## 2019-07-02 MED ORDER — LOSARTAN POTASSIUM 25 MG PO TABS: 25.0000 mg | ORAL_TABLET | Freq: Every day | ORAL | 3 refills | Status: DC

## 2019-07-02 MED ORDER — ISOSORBIDE MONONITRATE ER 30 MG PO TB24: 30.0000 mg | ORAL_TABLET | Freq: Every day | ORAL | 3 refills | Status: DC

## 2019-07-02 NOTE — Progress Notes (Signed)
Cardiology Office Note  Date:  07/02/2019   ID:  [name], DOB 1941-07-16, MRN [id number]  PCP:  [name] Bush, MD   Chief Complaint  Patient presents with  . othe r    Pt. c/o shortness of breath and swelling in left hand. The patient was positive for COVID on 05/30/2019 and was in the hospital for 10 days. Meds reviewed by the pt. verbally.     HPI:  78 year old gentleman with hx of OSA, did not tolerate CPAP mild  bilateral carotid arterial disease  <39% bilaterally 08/2016 hyperlipidemia  coronary artery disease,  bypass in 1990,  stenting to the circumflex in 2002,  Last cath 06/2015, patent grafts x 2, RCA small vessel diffuse disease DVT in the left lower extremity,  Completed back surgery for spinal stenosis, 06/20/2016 Cervical neck surgery 01/2017, [name]   total knee surgery in 02/2017 who presents for routine followup Of his coronary artery disease.   Review of records from the hospital  05/2019 COVID , and hospital 10 days treated with remdesivir and steroids, antibodies little bit of leg swelling but he attributed to not taking diuretics   Back in the hospital again for an additional 10-day stay October 28 he started to run another fever up to 101.3 has not had any chest pain or wheezing no headaches. blood pressure has been  running low in the 90s so he stopped using his lisinopril. His blood pressure after that improved to 130s over 60s  On losartan, following d/c, BP low Stop the medication on his own  Echo 06/20/2019 Left ventricular ejection fraction, by visual estimation, is 35 to 40%.   CT chest, results reviewed with him . Diffuse multifocal areas of consolidation and ground-glass opacity throughout the lungs compatible with atypical infection in the setting of known COVID positivity.  REDS VEST 42% on today's visit  LE arterial 08/2018, results reviewed with him  Right lower extremity: Moderately to severely calcified SFA  with diffuse disease throughout its course especially in the midsegment and three-vessel runoff below the knee.  Left lower extremity: Borderline significant SFA disease which is moderately to severely calcified and three-vessel runoff below the knee. Periodic follow-up with Dr. [name]  Symptoms complicated by diffuse arthritis, back pain  EKG personally reviewed by myself on todays visit Shows normal sinus rhythm rate 94 bpm PVCs no significant ST-T wave changes, left axis deviation  Other past medical history AAA less than 3 cm, on scan in 2018 reviewed with him Numerous previous episode of chest pain relieved with belching consistent with GI etiology.   Previous cardiac catheterization 07/07/2015  Ost LM to LM lesion, 50% stenosed.  Ost RCA lesion, 80% stenosed.  Prox RCA-1 lesion, 80% stenosed.  Prox RCA-2 lesion, 80% stenosed.  LIMA was injected is moderate in size, and is anatomically normal.  Dist LAD lesion, 60% stenosed.  Ost LAD to Prox LAD lesion, 100% stenosed.  Mid LAD lesion, 60% stenosed.  The left ventricular systolic function is normal.  SVG was injected is normal in caliber, and is anatomically normal.  2nd Diag lesion, 50% stenosed.  severe RCA disease (small vessel) not amenable to PCI, moderate left main disease, moderate LAD and proximal diagonal disease. --Patent grafts x2 --No intervention performed, started on isosorbide 30 mg daily  went to the emergency room in [facility] 07/03/2013 with chest pain. Had a CT scan of his chest which was essentially benign. Nitroglycerin did not help his pain. He ruled out and was  sent home.  Repeat episode of chest pain 08/05/2013 with stress test at that time showing no significant ischemia, ejection fraction 48%. It was felt his chest pain was atypical in nature. She feels it was from GI etiology  given his negative stress test.  History of cold induced chest tightness/shortness of breath, asthma.   episode of chest pain 05/14/2013. He felt was indigestion. He took several nitroglycerin with no significant improvement in his symptoms. Symptoms were severe, felt similar to his previous MI. He got in his car and on the way, had a big burp. Symptoms seemed to resolve at that time. He continued on to the emergency room. There cardiac enzymes were negative x2, EKG unchanged. He reports having had prior chest pain episode relieved with belching in the past.   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  PSH:    Past Surgical History:  Procedure Laterality Date  . ABDOMINAL AORTOGRAM W/LOWER EXTREMITY [name] 09/12/2018   Procedure: ABDOMINAL AORTOGRAM W/LOWER EXTREMITY;  Surgeon: [name], MD;  Location: [facility];  Service: Cardiovascular;  Laterality: [name];  . APPENDECTOMY     rupture  . CARDIAC CATHETERIZATION [name] 07/07/2015   Procedure: Left Heart Cath and Cors/Grafts Angiography;  Surgeon: [name], MD;  Location: [facility];  Service: Cardiovascular;   Laterality: [name];  . CATARACT EXTRACTION, BILATERAL    . CERVICAL SPINE SURGERY  12/2016   cervical stenosis [name])  . COLONOSCOPY  03/2010   HP polyp, diverticulosis, rpt 10 yrs ([name])  . CORONARY ARTERY BYPASS GRAFT  1990   3 vessel   . CYSTOSCOPY  12/23/10   [facility]  . EYE SURGERY     cataract  . KNEE ARTHROSCOPY Right    x2  . LAMINOTOMY  1986   L5/S1 lumbar laminotomy for two ruptured discs/fusion  . LUMBAR LAMINECTOMY/DECOMPRESSION MICRODISCECTOMY [name] 07/13/2016   Procedure: LUMBAR TWO-THREE, LUMBAR THREE-FOUR, LUMBAR FOUR-FIVE LAMINECTOMY AND FORAMINOTOMY;  Surgeon: [name], MD;  Location: [facility];  Service: Neurosurgery;  Laterality: [name];  LAMINECTOMY AND FORAMINOTOMY L2-L3, L3-L4,L4-L5  . TOTAL HIP ARTHROPLASTY Bilateral 1999,2000  . TOTAL KNEE ARTHROPLASTY Right 03/06/2017   Procedure: RIGHT TOTAL KNEE ARTHROPLASTY;  Surgeon: Gaynelle Arabian, MD;  Location: [facility];  Service: Orthopedics;  Laterality: Right;    Current Outpatient Medications  Medication Sig Dispense Refill  . acetaminophen (TYLENOL) 650 MG CR tablet Take 1,300 mg by mouth every 8 (eight) hours as needed for pain.    Marland Kitchen albuterol (VENTOLIN HFA) 108 (90 Base) MCG/ACT inhaler Inhale 2 puffs into the lungs every 6 (six) hours as needed for wheezing or shortness of breath. 18 g 3  . aspirin EC 81 MG EC tablet Take 1 tablet (81 mg total) by mouth daily. 30 tablet 0  . Cyanocobalamin (B-12) 1000 MCG SUBL Place 1 tablet under the tongue daily. (Patient taking differently: Place 1 tablet  under the tongue daily with lunch. ) 30 each   . ezetimibe (ZETIA) 10 MG tablet TAKE ONE TABLET BY MOUTH EVERY DAY (Patient taking differently: Take 10 mg by mouth at bedtime. ) 90 tablet 3  . furosemide (LASIX) 20 MG tablet Take 1 tablet (20 mg total) by mouth daily. 30 tablet 0  . gabapentin (NEURONTIN) 300 MG capsule Take 300 mg by mouth 3 (three) times daily.     . isosorbide mononitrate (IMDUR) 60 MG 24 hr tablet Take 1 tablet  (60 mg total) by mouth daily. 30 tablet 0  . ketoconazole (NIZORAL) 2 % cream Apply 1 application topically daily as needed for irritation.     . nitroGLYCERIN (NITROSTAT) 0.4 MG SL tablet Place 1 tablet (0.4 mg total) under the tongue every 5 (five) minutes as needed for chest pain. 25 tablet 3  . polyethylene glycol (MIRALAX / GLYCOLAX) 17 g packet Take 17 g by mouth every other day.     . ranolazine (RANEXA) 500 MG 12 hr tablet Take 1 tablet (500 mg total) by mouth 2 (two) times daily. 60 tablet 0  . simvastatin (ZOCOR) 40 MG tablet TAKE ONE TABLET BY MOUTH EVERY DAY (Patient taking differently: Take 40 mg by mouth at bedtime. ) 90 tablet 3  . tolterodine (DETROL LA) 4 MG 24 hr capsule Take 4 mg by mouth daily.    Marland Kitchen losartan (COZAAR) 25 MG tablet Take 1 tablet (25 mg total) by mouth daily. (Patient not taking: Reported on 07/02/2019) 30 tablet 0   No current facility-administered medications for this visit.      Allergies:   Vioxx [rofecoxib], Contrast media [iodinated diagnostic agents], and Morphine   Social History:  The patient  reports that he quit smoking about 30 years ago. His smoking use included cigarettes. He has a 25.00 pack-year smoking history. He has never used smokeless tobacco. He reports that he does not drink alcohol or use drugs.   Family History:   family history includes Cancer in his sister and another family member; Diabetes in his brother and mother; Heart attack in his mother; Heart disease in his father; Kidney disease in his father and sister; Stroke in his father and mother.    Review of Systems: Review of Systems  Constitutional: Negative.   HENT: Negative.   Respiratory: Positive for shortness of breath.   Cardiovascular: Negative.   Gastrointestinal: Negative.   Musculoskeletal: Negative.   Neurological: Negative.   Psychiatric/Behavioral: Negative.   All other systems reviewed and are negative.   PHYSICAL EXAM: VS:  BP 130/72 (BP Location: Left  Arm, Patient Position: Sitting, Cuff Size: Normal)   Pulse 94   Ht 6' (1.829 m)   Wt 199 lb 12 oz (90.6 kg)   SpO2 96%   BMI 27.09 kg/m  , BMI Body mass index is 27.09 kg/m. Constitutional:  oriented to person, place, and time. No distress.  HENT:  Head: Grossly normal Eyes:  no discharge. No scleral icterus.  Neck: No JVD, no carotid bruits  Cardiovascular: Regular rate and rhythm, no murmurs appreciated Pulmonary/Chest: Clear to auscultation bilaterally, no wheezes or rails Abdominal: Soft.  no distension.  no tenderness.  Musculoskeletal: Normal range of motion Neurological:  normal muscle tone. Coordination normal. No atrophy Skin: Skin warm and dry Psychiatric: normal affect, pleasant   Recent Labs: 06/19/2019: B Natriuretic Peptide 275.7 06/23/2019: ALT 71 06/24/2019: Magnesium 2.3 06/25/2019: BUN 37; Creatinine, Ser 1.02; Hemoglobin 11.1; Platelets 293; Potassium 4.1; Sodium 138  Lipid Panel Lab Results  Component Value Date   CHOL 109 06/04/2019   HDL 33 (L) 06/04/2019   LDLCALC 62 06/04/2019   TRIG 78 06/18/2019      Wt Readings from Last 3 Encounters:  07/02/19 199 lb 12 oz (90.6 kg)  07/01/19 196 lb 7 oz (89.1 kg)  06/23/19 199 lb 8 oz (90.5 kg)      ASSESSMENT AND PLAN:  Cardiomyopathy Post covid Inflammation in lung on CT scan Start metoprolol succinate 12.5 mg daily losartan 25 daily, imdur 30 daily He will call with BP in the next week or two  Covid , recovering Recovering from infection 05/2019 We can d/c oxygen Recovery discussed with him, will likely be slow  Mixed hyperlipidemia Numbers at goal, no changes made  Essential hypertension Blood pressure elevated on today's visit He will check numbers suggested we can call our office Prior office visits reviewed with other providers and typically [id number] systolic He is in pain today, knees legs  Atherosclerosis of coronary artery bypass graft of native heart with stable angina pectoris  (HCC) No angina imdur to 30 daily Start metoprolol and losartan  Bilateral carotid artery stenosis Mild bilateral carotid disease less than 39% bilaterally Diabetes numbers well controlled, cholesterol numbers controlled  Diet-controlled diabetes mellitus (HCC) Weight stable HBA1C 7.4, watch diet  Ex-smoker Nonsmoker  PAD Prior NGO with Dr. [name] ,  bilateral SFA disease Cold feet   Disposition:   F/U  1 month   Total encounter time more than 45 minutes  Greater than 50% was spent in counseling and coordination of care with the patient    Orders Placed This Encounter  Procedures  . EKG 12-Lead     Signed, Esmond Plants, M.D., Ph.D. 07/02/2019  [place], [place]

## 2019-07-02 NOTE — Patient Instructions (Addendum)
[facility]: we will cancel oxygen Needs order   Medication Instructions:  Your physician has recommended you make the following change in your medication:  1. DECREASE Isosorbide mononitrate 30 mg daily at bedtime 2. START Losartan 25 mg once daily in the morning 3. START Metoprolol succinate 25 mg take 1/2 tablet (12.5 mg) once daily in the morning  Your physician has requested that you regularly monitor and record your blood pressure readings at home. Please use the same machine at the same time of day to check your readings and record them to bring to your follow-up visit.    If you need a refill on your cardiac medications before your next appointment, please call your pharmacy.    Lab work: No new labs needed   If you have labs (blood work) drawn today and your tests are completely normal, you will receive your results only by: Marland Kitchen MyChart Message (if you have MyChart) OR . A paper copy in the mail If you have any lab test that is abnormal or we need to change your treatment, we will call you to review the results.   Testing/Procedures: No new testing needed   Follow-Up: At [facility], you and your health needs are our priority.  As part of our continuing mission to provide you with exceptional heart care, we have created designated Provider Care Teams.  These Care Teams include your primary Cardiologist (physician) and Advanced Practice Providers (APPs -  Physician Assistants and Nurse Practitioners) who all work together to provide you with the care you need, when you need it.  . You will need a follow up appointment in 1 month .  Marland Kitchen Providers on your designated Care Team:   . Murray Hodgkins, NP . Christell Faith, PA-C . Marrianne Mood, PA-C  Any Other Special Instructions Will Be Listed Below (If Applicable).  For educational health videos Log in to : [email or web address] Or : [email or web address], password : [email or web address]   How to Take Your Blood Pressure You can take  your blood pressure at home with a machine. You may need to check your blood pressure at home:  To check if you have high blood pressure (hypertension).  To check your blood pressure over time.  To make sure your blood pressure medicine is working. Supplies needed: You will need a blood pressure machine, or monitor. You can buy one at a drugstore or online. When choosing one:  Choose one with an arm cuff.  Choose one that wraps around your upper arm. Only one finger should fit between your arm and the cuff.  Do not choose one that measures your blood pressure from your wrist or finger. Your doctor can suggest a monitor. How to prepare Avoid these things for 30 minutes before checking your blood pressure:  Drinking caffeine.  Drinking alcohol.  Eating.  Smoking.  Exercising. Five minutes before checking your blood pressure:  Pee.  Sit in a dining chair. Avoid sitting in a soft couch or armchair.  Be quiet. Do not talk. How to take your blood pressure Follow the instructions that came with your machine. If you have a digital blood pressure monitor, these may be the instructions: 1. Sit up straight. 2. Place your feet on the floor. Do not cross your ankles or legs. 3. Rest your left arm at the level of your heart. You may rest it on a table, desk, or chair. 4. Pull up your shirt sleeve. 5. Wrap the blood pressure cuff around the upper part  of your left arm. The cuff should be 1 inch (2.5 cm) above your elbow. It is best to wrap the cuff around bare skin. 6. Fit the cuff snugly around your arm. You should be able to place only one finger between the cuff and your arm. 7. Put the cord inside the groove of your elbow. 8. Press the power button. 9. Sit quietly while the cuff fills with air and loses air. 10. Write down the numbers on the screen. 11. Wait 2-3 minutes and then repeat steps 1-10. What do the numbers mean? Two numbers make up your blood pressure. The first  number is called systolic pressure. The second is called diastolic pressure. An example of a blood pressure reading is "120 over 80" (or 120/80). If you are an adult and do not have a medical condition, use this guide to find out if your blood pressure is normal: Normal  First number: below 120.  Second number: below 80. Elevated  First number: 120-129.  Second number: below 80. Hypertension stage 1  First number: 130-139.  Second number: 80-89. Hypertension stage 2  First number: 140 or above.  Second number: 14 or above. Your blood pressure is above normal even if only the top or bottom number is above normal. Follow these instructions at home:  Check your blood pressure as often as your doctor tells you to.  Take your monitor to your next doctor's appointment. Your doctor will: ? Make sure you are using it correctly. ? Make sure it is working right.  Make sure you understand what your blood pressure numbers should be.  Tell your doctor if your medicines are causing side effects. Contact a doctor if:  Your blood pressure keeps being high. Get help right away if:  Your first blood pressure number is higher than 180.  Your second blood pressure number is higher than 120. This information is not intended to replace advice given to you by your health care provider. Make sure you discuss any questions you have with your health care provider. Document Released: 07/14/2008 Document Revised: 07/14/2017 Document Reviewed: 01/08/2016 Elsevier Patient Education  2020 [employer].

## 2019-07-08 ENCOUNTER — Other Ambulatory Visit: Payer: Self-pay

## 2019-07-08 ENCOUNTER — Encounter: Payer: Self-pay | Admitting: Family Medicine

## 2019-07-08 ENCOUNTER — Ambulatory Visit (INDEPENDENT_AMBULATORY_CARE_PROVIDER_SITE_OTHER): Payer: PPO | Admitting: Family Medicine

## 2019-07-08 VITALS — BP 120/64 | HR 88 | Temp 97.8°F | Ht 72.0 in | Wt 203.4 lb

## 2019-07-08 DIAGNOSIS — R1900 Intra-abdominal and pelvic swelling, mass and lump, unspecified site: Secondary | ICD-10-CM | POA: Insufficient documentation

## 2019-07-08 DIAGNOSIS — U071 COVID-19: Secondary | ICD-10-CM

## 2019-07-08 DIAGNOSIS — I43 Cardiomyopathy in diseases classified elsewhere: Secondary | ICD-10-CM | POA: Diagnosis not present

## 2019-07-08 DIAGNOSIS — Z8619 Personal history of other infectious and parasitic diseases: Secondary | ICD-10-CM | POA: Diagnosis not present

## 2019-07-08 DIAGNOSIS — R1904 Left lower quadrant abdominal swelling, mass and lump: Secondary | ICD-10-CM | POA: Diagnosis not present

## 2019-07-08 MED ORDER — FUROSEMIDE 20 MG PO TABS: 20.0000 mg | ORAL_TABLET | Freq: Every day | ORAL | 3 refills | Status: DC

## 2019-07-08 NOTE — Assessment & Plan Note (Signed)
Appreciate cards care. Planned close f/u.

## 2019-07-08 NOTE — Patient Instructions (Signed)
I think lump in abdomen is small hematoma (collection of blood under the skin) from lovenox shots while in hospital.  May use warm compresses to lump - may help resolve.  You may end up having small residual lump (if hematoma calcifies).  We can continue watching this.

## 2019-07-08 NOTE — Assessment & Plan Note (Signed)
Anticipate small hematoma due to lovenox use. Advised warm compresses to speed resolution, but may have residual calcified hematoma remain. Reassurance provided.

## 2019-07-08 NOTE — Progress Notes (Signed)
This visit was conducted in person.  BP 120/64 (BP Location: Left Arm, Patient Position: Sitting, Cuff Size: Normal)   Pulse 88   Temp 97.8 F (36.6 C) (Temporal)   Ht 6' (1.829 m)   Wt 203 lb 6 oz (92.3 kg)   SpO2 94%   BMI 27.58 kg/m    CC: check lump on side Subjective:    Patient ID: [name], male    DOB: 01-29-1941, 78 y.o.   MRN: [id number]  HPI: [name] is a 78 y.o. male presenting on 07/08/2019 for Mass (C/o knot on LLQ of abd.  Noticed some mos ago and has increased in size. Denies any pain. )   Recent Covid recovery with residual Covid cardiomyopathy saw Dr Rockey Situ last week.   Here with lump to left abdomen present for last 1+ month. Nontender. May be enlarging. Some bruising at this site. He is only on aspirin 81mg  daily. No abd pain, nausea/vomiting, diarrhea/constipation. No GERD. Normally 1 soft stool/day.      Relevant past medical, surgical, family and social history reviewed and updated as indicated. Interim medical history since our last visit reviewed. Allergies and medications reviewed and updated. Outpatient Medications Prior to Visit  Medication Sig Dispense Refill  . acetaminophen (TYLENOL) 650 MG CR tablet Take 1,300 mg by mouth every 8 (eight) hours as needed for pain.    Marland Kitchen albuterol (VENTOLIN HFA) 108 (90 Base) MCG/ACT inhaler Inhale 2 puffs into the lungs every 6 (six) hours as needed for wheezing or shortness of breath. 18 g 3  . aspirin EC 81 MG EC tablet Take 1 tablet (81 mg total) by mouth daily. 30 tablet 0  . Cyanocobalamin (B-12) 1000 MCG SUBL Place 1 tablet under the tongue daily. (Patient taking differently: Place 1 tablet under the tongue daily with lunch. ) 30 each   . ezetimibe (ZETIA) 10 MG tablet TAKE ONE TABLET BY MOUTH EVERY DAY (Patient taking differently: Take 10 mg by mouth at bedtime. ) 90 tablet 3  . gabapentin (NEURONTIN) 300 MG capsule Take 300 mg by mouth 3 (three) times daily.     . isosorbide mononitrate  (IMDUR) 30 MG 24 hr tablet Take 1 tablet (30 mg total) by mouth at bedtime. 90 tablet 3  . ketoconazole (NIZORAL) 2 % cream Apply 1 application topically daily as needed for irritation.     Marland Kitchen losartan (COZAAR) 25 MG tablet Take 1 tablet (25 mg total) by mouth daily. 90 tablet 3  . metoprolol succinate (TOPROL XL) 25 MG 24 hr tablet Take 0.5 tablets (12.5 mg total) by mouth daily. 45 tablet 3  . nitroGLYCERIN (NITROSTAT) 0.4 MG SL tablet Place 1 tablet (0.4 mg total) under the tongue every 5 (five) minutes as needed for chest pain. 25 tablet 3  . polyethylene glycol (MIRALAX / GLYCOLAX) 17 g packet Take 17 g by mouth every other day.     . ranolazine (RANEXA) 500 MG 12 hr tablet Take 1 tablet (500 mg total) by mouth 2 (two) times daily. 60 tablet 0  . simvastatin (ZOCOR) 40 MG tablet TAKE ONE TABLET BY MOUTH EVERY DAY (Patient taking differently: Take 40 mg by mouth at bedtime. ) 90 tablet 3  . tolterodine (DETROL LA) 4 MG 24 hr capsule Take 4 mg by mouth daily.    . furosemide (LASIX) 20 MG tablet Take 1 tablet (20 mg total) by mouth daily. 30 tablet 0   No facility-administered medications prior to visit.  Per HPI unless specifically indicated in ROS section below Review of Systems Objective:    BP 120/64 (BP Location: Left Arm, Patient Position: Sitting, Cuff Size: Normal)   Pulse 88   Temp 97.8 F (36.6 C) (Temporal)   Ht 6' (1.829 m)   Wt 203 lb 6 oz (92.3 kg)   SpO2 94%   BMI 27.58 kg/m   Wt Readings from Last 3 Encounters:  07/08/19 203 lb 6 oz (92.3 kg)  07/02/19 199 lb 12 oz (90.6 kg)  07/01/19 196 lb 7 oz (89.1 kg)    Physical Exam Vitals signs and nursing note reviewed.  Constitutional:      Appearance: Normal appearance. He is not ill-appearing.  HENT:     Mouth/Throat:     Mouth: Mucous membranes are moist.     Pharynx: Oropharynx is clear.  Cardiovascular:     Rate and Rhythm: Normal rate and regular rhythm.     Pulses: Normal pulses.     Heart sounds:  Normal heart sounds. No murmur.  Pulmonary:     Effort: Pulmonary effort is normal. No respiratory distress.     Breath sounds: Normal breath sounds. No wheezing, rhonchi or rales.  Abdominal:     General: Abdomen is flat. Bowel sounds are normal. There is no distension.     Palpations: Abdomen is soft. There is mass (firm round ~1cm lump underlying bruising L lower abdomen).     Tenderness: There is abdominal tenderness. There is no guarding or rebound.     Hernia: No hernia is present.    Musculoskeletal:     Right lower leg: No edema.     Left lower leg: No edema.  Skin:    Findings: Bruising present. No rash.  Neurological:     Mental Status: He is alert.  Psychiatric:        Mood and Affect: Mood normal.        Behavior: Behavior normal.        Assessment & Plan:  This visit occurred during the SARS-CoV-2 public health emergency.  Safety protocols were in place, including screening questions prior to the visit, additional usage of staff PPE, and extensive cleaning of exam room while observing appropriate contact time as indicated for disinfecting solutions.   Problem List Items Addressed This Visit    Lump in the abdomen - Primary    Anticipate small hematoma due to lovenox use. Advised warm compresses to speed resolution, but may have residual calcified hematoma remain. Reassurance provided.       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 Planned close f/u.       Relevant Medications   furosemide (LASIX) 20 MG tablet       Meds ordered this encounter  Medications  . furosemide (LASIX) 20 MG tablet    Sig: Take 1 tablet (20 mg total) by mouth daily.    Dispense:  30 tablet    Refill:  3   No orders of the defined types were placed in this encounter.  Patient Instructions  I think lump in abdomen is small hematoma (collection of blood under the skin) from lovenox shots while in hospital.  May use warm compresses to lump - may help  resolve.  You may end up having small residual lump (if hematoma calcifies).  We can continue watching this.    Follow up plan: Return if symptoms worsen or fail to improve.  Ria Bush, MD

## 2019-07-09 NOTE — Telephone Encounter (Signed)
Patient upset he has not received a response.  Please call

## 2019-07-09 NOTE — Telephone Encounter (Signed)
Agree with your thoughts Not sure if wife needs to be in office. He is getting over covid, they dont need exposure

## 2019-07-19 ENCOUNTER — Telehealth: Payer: Self-pay

## 2019-07-19 ENCOUNTER — Encounter: Payer: Self-pay | Admitting: Family Medicine

## 2019-07-19 ENCOUNTER — Ambulatory Visit (INDEPENDENT_AMBULATORY_CARE_PROVIDER_SITE_OTHER): Payer: PPO | Admitting: Family Medicine

## 2019-07-19 ENCOUNTER — Other Ambulatory Visit: Payer: Self-pay

## 2019-07-19 DIAGNOSIS — R0981 Nasal congestion: Secondary | ICD-10-CM | POA: Insufficient documentation

## 2019-07-19 MED ORDER — AMOXICILLIN 875 MG PO TABS: 875.0000 mg | ORAL_TABLET | Freq: Two times a day (BID) | ORAL | 0 refills | Status: DC

## 2019-07-19 NOTE — Assessment & Plan Note (Signed)
Anticipate viral URI. Supportive care reviewed. rec mucinex, flonase, nasal saline. Not consistent with bacterial sinusitis however given recent complicated covid history and comorbidities and upcoming weekend, WASP sent to pharmacy for amoxicillin with indications when to fill. Pt agrees with plan.

## 2019-07-19 NOTE — Progress Notes (Signed)
Virtual visit completed through [employer]. Due to national recommendations of social distancing due to COVID-19, a virtual visit is felt to be most appropriate for this patient at this time. Reviewed limitations of a virtual visit.   Patient location: home Provider location: [facility] at [facility], office If any vitals were documented, they were collected by patient at home unless specified below.    BP (!) 114/56   Pulse 60   Temp (!) 97.1 F (36.2 C)   Wt 199 lb (90.3 kg)   SpO2 94%   BMI 26.99 kg/m    CC: congestion Subjective:    Patient ID: [name], male    DOB: 08-06-1941, 78 y.o.   MRN: [id number]  HPI: [name] is a 78 y.o. male presenting on 07/19/2019 for URI (Head congestion and shortness of breath (but says that is from CHF) started a few days ago)   3d h/o nasal congestion, with some green mucous.  No sinus/facial pain. No fevers/chills, cough, ST, PNdrainage, HA, ear or tooth pain. No eye trouble.  Non smoker.  Has tried mucinex this morning.   Ongoing exertional dyspnea from CHF.   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 He was also treated for COPD exacerbation and acute CHF exacerbation while hospitalized with Covid.      Relevant past medical, surgical, family and social history reviewed and updated as indicated. Interim medical history since our last visit reviewed. Allergies and medications reviewed and updated. Outpatient Medications Prior to Visit  Medication Sig Dispense Refill  . acetaminophen (TYLENOL) 650 MG CR tablet Take 1,300 mg by mouth every 8 (eight) hours as needed for pain.    Marland Kitchen albuterol (VENTOLIN HFA) 108 (90 Base) MCG/ACT inhaler Inhale 2 puffs into the lungs every 6 (six) hours as needed for wheezing or shortness of breath. 18 g 3  . aspirin EC 81 MG EC tablet Take 1 tablet (81 mg total) by mouth daily. 30 tablet 0  . Cyanocobalamin (B-12) 1000 MCG SUBL Place 1 tablet under the tongue  daily. (Patient taking differently: Place 1 tablet under the tongue daily with lunch. ) 30 each   . ezetimibe (ZETIA) 10 MG tablet TAKE ONE TABLET BY MOUTH EVERY DAY (Patient taking differently: Take 10 mg by mouth at bedtime. ) 90 tablet 3  . furosemide (LASIX) 20 MG tablet Take 1 tablet (20 mg total) by mouth daily. 30 tablet 3  . gabapentin (NEURONTIN) 300 MG capsule Take 300 mg by mouth 3 (three) times daily.     . isosorbide mononitrate (IMDUR) 30 MG 24 hr tablet Take 1 tablet (30 mg total) by mouth at bedtime. 90 tablet 3  . ketoconazole (NIZORAL) 2 % cream Apply 1 application topically daily as needed for irritation.     Marland Kitchen losartan (COZAAR) 25 MG tablet Take 1 tablet (25 mg total) by mouth daily. 90 tablet 3  . metoprolol succinate (TOPROL XL) 25 MG 24 hr tablet Take 0.5 tablets (12.5 mg total) by mouth daily. 45 tablet 3  . nitroGLYCERIN (NITROSTAT) 0.4 MG SL tablet Place 1 tablet (0.4 mg total) under the tongue every 5 (five) minutes as needed for chest pain. 25 tablet 3  . polyethylene glycol (MIRALAX / GLYCOLAX) 17 g packet Take 17 g by mouth every other day.     . ranolazine (RANEXA) 500 MG 12 hr tablet Take 1 tablet (500 mg total) by mouth 2 (two) times daily. 60 tablet 0  . simvastatin (ZOCOR)  40 MG tablet TAKE ONE TABLET BY MOUTH EVERY DAY (Patient taking differently: Take 40 mg by mouth at bedtime. ) 90 tablet 3  . tolterodine (DETROL LA) 4 MG 24 hr capsule Take 4 mg by mouth daily.     No facility-administered medications prior to visit.      Per HPI unless specifically indicated in ROS section below Review of Systems Objective:    BP (!) 114/56   Pulse 60   Temp (!) 97.1 F (36.2 C)   Wt 199 lb (90.3 kg)   SpO2 94%   BMI 26.99 kg/m   Wt Readings from Last 3 Encounters:  07/19/19 199 lb (90.3 kg)  07/08/19 203 lb 6 oz (92.3 kg)  07/02/19 199 lb 12 oz (90.6 kg)     Physical exam: Gen: alert, NAD, not ill appearing Pulm: speaks in complete sentences without  increased work of breathing Psych: normal mood, normal thought content      Assessment & Plan:   Problem List Items Addressed This Visit    Sinus congestion    Anticipate viral URI. Supportive care reviewed. rec mucinex, flonase, nasal saline. Not consistent with bacterial sinusitis however given recent complicated covid history and comorbidities and upcoming weekend, WASP sent to pharmacy for amoxicillin with indications when to fill. Pt agrees with plan.           Meds ordered this encounter  Medications  . amoxicillin (AMOXIL) 875 MG tablet    Sig: Take 1 tablet (875 mg total) by mouth 2 (two) times daily.    Dispense:  20 tablet    Refill:  0   No orders of the defined types were placed in this encounter.   I discussed the assessment and treatment plan with the patient. The patient was provided an opportunity to ask questions and all were answered. The patient agreed with the plan and demonstrated an understanding of the instructions. The patient was advised to call back or seek an in-person evaluation if the symptoms worsen or if the condition fails to improve as anticipated.  Follow up plan: Return if symptoms worsen or fail to improve.  Ria Bush, MD

## 2019-07-19 NOTE — Telephone Encounter (Signed)
Pt said for 2 days when blows his nose he has thick,green mucus;also SOB worse last 2-3 days and runny nose. Pt is tired in afternoon, walking with walker; no fever,chills,and no H/A.pt is no weaker than usual. Dr Darnell Level will do virtual today at 3:45 . Pt voiced understanding and appreciative.

## 2019-07-24 ENCOUNTER — Other Ambulatory Visit: Payer: Self-pay | Admitting: *Deleted

## 2019-07-24 NOTE — Patient Outreach (Signed)
[facility]) Care Management  07/24/2019  [name] 25-Mar-1941 [id number]   Case reviewed, no patient outreach needed,  and case closed per [name], Assistant Clinical Director at [facility]  request.   [name]. Annia Friendly, BSN, [place] Management [facility] Telephonic CM Phone: [phone] Fax: [phone]

## 2019-08-05 NOTE — Progress Notes (Signed)
Cardiology Office Note  Date:  08/06/2019   ID:  [name], DOB 12-30-1940, MRN [id number]  PCP:  Ria Bush, MD   Chief Complaint  Patient presents with  . OTHER    1 month f/u no complaints today. Meds reviewed verbally with pt.    HPI:  78 year old gentleman with hx of OSA, did not tolerate CPAP mild  bilateral carotid arterial disease  <39% bilaterally 08/2016 hyperlipidemia  coronary artery disease,  bypass in 1990,  stenting to the circumflex in 2002,  Last cath 06/2015, patent grafts x 2, RCA small vessel diffuse disease DVT in the left lower extremity,  Completed back surgery for spinal stenosis, 06/20/2016 Cervical neck surgery 01/2017, [name]   total knee surgery in 02/2017 who presents for routine followup Of his coronary artery disease.   Echo 06/2019, results reviewed with him Left ventricular ejection fraction, by visual estimation, is 35 to 40%. Last clinic visit started on : metoprolol succinate 12.5 mg daily losartan 25 daily, imdur 30 daily  Feels somewhat better, compared to prior clinic visit Post Covid, feels like he has almost recovered Still tired, difficulty walking long distances  BP [id number] to [id number] systolic at home REDS vest today in the office 37% REDS VEST 42% last office visit, numbers improved on Lasix  Sugars running high From steroids in the hospital  Reports having less leg swelling, less abdominal bloating, breathing somewhat better  Symptoms complicated by diffuse arthritis, back pain  EKG personally reviewed by myself on todays visit Shows normal sinus rhythm rate 62 bpm , no significant ST-T wave changes, left axis deviation  05/2019 COVID , and hospital 10 days treated with remdesivir and steroids, antibodies little bit of leg swelling but he attributed to not taking diuretics   Back in the hospital again for an additional 10-day stay October 28 he started to run another fever up to 101.3 has not had any  chest pain or wheezing no headaches. blood pressure has been  running low in the 90s so he stopped using his lisinopril. His blood pressure after that improved to 130s over 60s  Echo 06/20/2019 Left ventricular ejection fraction, by visual estimation, is 35 to 40%.   CT chest,  . Diffuse multifocal areas of consolidation and ground-glass opacity throughout the lungs compatible with atypical infection in the setting of known COVID positivity.  LE arterial 08/2018,   Right lower extremity: Moderately to severely calcified SFA with diffuse disease throughout its course especially in the midsegment and three-vessel runoff below the knee.   Other past medical history AAA less than 3 cm, on scan in 2018 reviewed with him Numerous previous episode of chest pain relieved with belching consistent with GI etiology.  Previous cardiac catheterization 07/07/2015  Ost LM to LM lesion, 50% stenosed.  Ost RCA lesion, 80% stenosed.  Prox RCA-1 lesion, 80% stenosed.  Prox RCA-2 lesion, 80% stenosed.  LIMA was injected is moderate in size, and is anatomically normal.  Dist LAD lesion, 60% stenosed.  Ost LAD to Prox LAD lesion, 100% stenosed.  Mid LAD lesion, 60% stenosed.  The left ventricular systolic function is normal.  SVG was injected is normal in caliber, and is anatomically normal.  2nd Diag lesion, 50% stenosed.  severe RCA disease (small vessel) not amenable to PCI, moderate left main disease, moderate LAD and proximal diagonal disease. 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  PSH:    Past Surgical History:  Procedure Laterality Date  . ABDOMINAL AORTOGRAM W/LOWER EXTREMITY [name] 09/12/2018   Procedure: ABDOMINAL AORTOGRAM W/LOWER EXTREMITY;  Surgeon: [name], MD;  Location: [place] [place] [facility];  Service: Cardiovascular;  Laterality: [name];  . APPENDECTOMY     rupture  . CARDIAC CATHETERIZATION [name] 07/07/2015   Procedure: Left Heart Cath and Cors/Grafts Angiography;  Surgeon: [name], MD;  Location: [place] [facility];  Service: Cardiovascular;  Laterality: [name];  . CATARACT EXTRACTION, BILATERAL    . CERVICAL SPINE SURGERY  12/2016   cervical stenosis [name])  . COLONOSCOPY  03/2010   HP polyp, diverticulosis, rpt 10 yrs ([name])  . CORONARY ARTERY BYPASS GRAFT  1990   3 vessel   . CYSTOSCOPY  12/23/10   [facility]  . EYE SURGERY     cataract  . KNEE ARTHROSCOPY Right    x2  . LAMINOTOMY  1986   L5/S1 lumbar laminotomy for two ruptured discs/fusion  . LUMBAR LAMINECTOMY/DECOMPRESSION MICRODISCECTOMY [name] 07/13/2016   Procedure: LUMBAR TWO-THREE, LUMBAR THREE-FOUR, LUMBAR FOUR-FIVE LAMINECTOMY AND FORAMINOTOMY;  Surgeon: [name], MD;  Location: [facility];  Service: Neurosurgery;  Laterality: [name];  LAMINECTOMY AND FORAMINOTOMY L2-L3, L3-L4,L4-L5  . TOTAL HIP ARTHROPLASTY Bilateral 1999,2000  . TOTAL KNEE ARTHROPLASTY Right 03/06/2017    Procedure: RIGHT TOTAL KNEE ARTHROPLASTY;  Surgeon: Gaynelle Arabian, MD;  Location: [facility];  Service: Orthopedics;  Laterality: Right;    Current Outpatient Medications  Medication Sig Dispense Refill  . acetaminophen (TYLENOL) 650 MG CR tablet Take 1,300 mg by mouth every 8 (eight) hours as needed for pain.    Marland Kitchen albuterol (VENTOLIN HFA) 108 (90 Base) MCG/ACT inhaler Inhale 2 puffs into the lungs every 6 (six) hours as needed for wheezing or shortness of breath. 18 g 3  . aspirin EC 81 MG EC tablet Take 1 tablet (81 mg total) by mouth daily. 30 tablet 0  . Cyanocobalamin (B-12) 1000 MCG SUBL Place 1 tablet under the tongue daily. (Patient taking differently: Place 1 tablet under the tongue daily with lunch. ) 30 each   . ezetimibe (ZETIA) 10 MG tablet TAKE ONE TABLET BY MOUTH EVERY DAY (Patient taking differently: Take 10 mg by mouth at bedtime. ) 90 tablet 3  . furosemide (LASIX) 20 MG tablet Take 1 tablet (20 mg total) by mouth daily. 30 tablet 3  . gabapentin (NEURONTIN) 300 MG capsule Take 300 mg by mouth 3 (three) times daily.     . isosorbide mononitrate (IMDUR) 30 MG 24 hr tablet Take 1 tablet (30 mg total) by mouth at bedtime. 90 tablet 3  . ketoconazole (NIZORAL) 2 % cream Apply 1 application topically daily as needed for irritation.     Marland Kitchen losartan (COZAAR) 25 MG tablet Take 1 tablet (25 mg total) by mouth daily. 90 tablet 3  . metoprolol succinate (TOPROL XL) 25  MG 24 hr tablet Take 0.5 tablets (12.5 mg total) by mouth daily. 45 tablet 3  . nitroGLYCERIN (NITROSTAT) 0.4 MG SL tablet Place 1 tablet (0.4 mg total) under the tongue every 5 (five) minutes as needed for chest pain. 25 tablet 3  . polyethylene glycol (MIRALAX / GLYCOLAX) 17 g packet Take 17 g by mouth every other day.     . ranolazine (RANEXA) 500 MG 12 hr tablet Take 1 tablet (500 mg total) by mouth 2 (two) times daily. 60 tablet 0  . simvastatin (ZOCOR) 40 MG tablet TAKE ONE TABLET BY MOUTH EVERY DAY (Patient taking  differently: Take 40 mg by mouth at bedtime. ) 90 tablet 3  . tolterodine (DETROL LA) 4 MG 24 hr capsule Take 4 mg by mouth daily.     No current facility-administered medications for this visit.     Allergies:   Vioxx [rofecoxib], Contrast media [iodinated diagnostic agents], and Morphine   Social History:  The patient  reports that he quit smoking about 30 years ago. His smoking use included cigarettes. He has a 25.00 pack-year smoking history. He has never used smokeless tobacco. He reports that he does not drink alcohol or use drugs.   Family History:   family history includes Cancer in his sister and another family member; Diabetes in his brother and mother; Heart attack in his mother; Heart disease in his father; Kidney disease in his father and sister; Stroke in his father and mother.   Review of Systems: Review of Systems  Constitutional: Positive for malaise/fatigue.  HENT: Negative.   Respiratory: Negative.   Cardiovascular: Negative.   Gastrointestinal: Negative.   Musculoskeletal: Negative.   Neurological: Negative.   Psychiatric/Behavioral: Negative.   All other systems reviewed and are negative.   PHYSICAL EXAM: VS:  BP 120/60 (BP Location: Left Arm, Patient Position: Sitting, Cuff Size: Normal)   Pulse 62   Ht 6' (1.829 m)   Wt 207 lb 4 oz (94 kg)   SpO2 94%   BMI 28.11 kg/m  , BMI Body mass index is 28.11 kg/m. Constitutional:  oriented to person, place, and time. No distress.  HENT:  Head: Grossly normal Eyes:  no discharge. No scleral icterus.  Neck: No JVD, no carotid bruits  Cardiovascular: Regular rate and rhythm, no murmurs appreciated Pulmonary/Chest: Clear to auscultation bilaterally, no wheezes or rails Abdominal: Soft.  no distension.  no tenderness.  Musculoskeletal: Normal range of motion Neurological:  normal muscle tone. Coordination normal. No atrophy Skin: Skin warm and dry Psychiatric: normal affect, pleasant   Recent Labs: 06/19/2019:  B Natriuretic Peptide 275.7 06/23/2019: ALT 71 06/24/2019: Magnesium 2.3 06/25/2019: BUN 37; Creatinine, Ser 1.02; Hemoglobin 11.1; Platelets 293; Potassium 4.1; Sodium 138    Lipid Panel Lab Results  Component Value Date   CHOL 109 06/04/2019   HDL 33 (L) 06/04/2019   LDLCALC 62 06/04/2019   TRIG 78 06/18/2019      Wt Readings from Last 3 Encounters:  08/06/19 207 lb 4 oz (94 kg)  07/19/19 199 lb (90.3 kg)  07/08/19 203 lb 6 oz (92.3 kg)      ASSESSMENT AND PLAN: Cardiomyopathy Post covid, Inflammation in lung on CT scan No medication changes, repeat echo in follow up Symptoms better  Covid , recovering Recovering from infection 05/2019 Still tired but getting back to his baseline stable  Mixed hyperlipidemia Numbers at goal, no changes made  Essential hypertension Blood pressure elevated on today's visit No changes made  Atherosclerosis  of coronary artery bypass graft of native heart with stable angina pectoris (HCC) No angina  Bilateral carotid artery stenosis Mild bilateral carotid disease less than 39% bilaterally Diabetes numbers well controlled, cholesterol numbers controlled  Diet-controlled diabetes mellitus (HCC) Weight stable HBA1C 7.4, watch diet Higher numbers with prednisone  Ex-smoker Nonsmoker  PAD Follows with Dr. [name] ,  bilateral SFA disease Cold feet and hands  Disposition:   F/U  3 month   Total encounter time more than 25 minutes  Greater than 50% was spent in counseling and coordination of care with the patient    No orders of the defined types were placed in this encounter.    Signed, Esmond Plants, M.D., Ph.D. 08/06/2019  [place], [place]

## 2019-08-06 ENCOUNTER — Other Ambulatory Visit: Payer: Self-pay

## 2019-08-06 ENCOUNTER — Encounter: Payer: Self-pay | Admitting: Cardiovascular Disease

## 2019-08-06 ENCOUNTER — Ambulatory Visit (INDEPENDENT_AMBULATORY_CARE_PROVIDER_SITE_OTHER): Payer: PPO | Admitting: Cardiovascular Disease

## 2019-08-06 VITALS — BP 120/60 | HR 62 | Ht 72.0 in | Wt 207.2 lb

## 2019-08-06 DIAGNOSIS — I25118 Atherosclerotic heart disease of native coronary artery with other forms of angina pectoris: Secondary | ICD-10-CM | POA: Diagnosis not present

## 2019-08-06 DIAGNOSIS — R0602 Shortness of breath: Secondary | ICD-10-CM | POA: Diagnosis not present

## 2019-08-06 DIAGNOSIS — I10 Essential (primary) hypertension: Secondary | ICD-10-CM

## 2019-08-06 DIAGNOSIS — I739 Peripheral vascular disease, unspecified: Secondary | ICD-10-CM

## 2019-08-06 DIAGNOSIS — E785 Hyperlipidemia, unspecified: Secondary | ICD-10-CM

## 2019-08-06 NOTE — Patient Instructions (Addendum)

## 2019-08-14 DIAGNOSIS — L240 Irritant contact dermatitis due to detergents: Secondary | ICD-10-CM | POA: Diagnosis not present

## 2019-09-04 DIAGNOSIS — H43813 Vitreous degeneration, bilateral: Secondary | ICD-10-CM | POA: Diagnosis not present

## 2019-09-04 LAB — HM DIABETES EYE EXAM

## 2019-09-07 ENCOUNTER — Ambulatory Visit: Payer: Self-pay

## 2019-09-07 ENCOUNTER — Ambulatory Visit: Payer: PPO | Attending: Internal Medicine

## 2019-09-07 DIAGNOSIS — Z23 Encounter for immunization: Secondary | ICD-10-CM | POA: Insufficient documentation

## 2019-09-07 NOTE — Progress Notes (Signed)
   Covid-19 Vaccination Clinic  Name:  [name]    MRN: [id number] DOB: 1941/01/26  09/07/2019  [name] was observed post Covid-19 immunization for 15 minutes without incidence. He was provided with Vaccine Information Sheet and instruction to access the V-Safe system.   [name] was instructed to call 911 with any severe reactions post vaccine: Marland Kitchen Difficulty breathing  . Swelling of your face and throat  . A fast heartbeat  . A bad rash all over your body  . Dizziness and weakness    Immunizations Administered    Name Date Dose VIS Date Route   Pfizer COVID-19 Vaccine 09/07/2019  1:39 PM 0.3 mL 07/26/2019 Intramuscular   Manufacturer: [place]   Lot: [id number]   [place]: [id number]

## 2019-09-09 ENCOUNTER — Encounter: Payer: Self-pay | Admitting: Family Medicine

## 2019-09-10 ENCOUNTER — Ambulatory Visit (INDEPENDENT_AMBULATORY_CARE_PROVIDER_SITE_OTHER): Payer: PPO | Admitting: Cardiovascular Disease

## 2019-09-10 ENCOUNTER — Encounter: Payer: Self-pay | Admitting: Cardiovascular Disease

## 2019-09-10 ENCOUNTER — Other Ambulatory Visit: Payer: Self-pay

## 2019-09-10 VITALS — BP 115/65 | HR 69 | Ht 72.0 in | Wt 203.0 lb

## 2019-09-10 DIAGNOSIS — U071 COVID-19: Secondary | ICD-10-CM

## 2019-09-10 DIAGNOSIS — R0602 Shortness of breath: Secondary | ICD-10-CM

## 2019-09-10 DIAGNOSIS — I43 Cardiomyopathy in diseases classified elsewhere: Secondary | ICD-10-CM | POA: Diagnosis not present

## 2019-09-10 NOTE — Progress Notes (Signed)
Cardiology Office Note  Date:  09/10/2019   ID:  [name], DOB 01/09/1941, MRN [id number]  PCP:  [name] Bush, MD   Chief Complaint  Patient presents with  . Other    Patient c/o lightheadedness and SOB. Meds reviewed verbally with patient.     HPI:  79 year old gentleman with hx of OSA, did not tolerate CPAP mild  bilateral carotid arterial disease  <39% bilaterally 08/2016 hyperlipidemia  coronary artery disease,  bypass in 1990,  stenting to the circumflex in 2002,  Last cath 06/2015, patent grafts x 2, RCA small vessel diffuse disease DVT in the left lower extremity,  Completed back surgery for spinal stenosis, 06/20/2016 Cervical neck surgery 01/2017, [name]   total knee surgery in 02/2017 who presents for routine followup Of his coronary artery disease, post covid.  Does not feel well past week or so, Tired, arms tired, he does not know why Felt better one month ago Had covid vaccine last weekend Feels his symptoms started before the Covid shot Good days and bad days, general malaise Reports he is sleeping well  No chest pain, denies shortness of breath on exertion  Imaging detailed below reviewed with him CT chest 06/2019 Diffuse multifocal areas of consolidation and ground-glass opacity throughout the lungs compatible with atypical infection in the setting of known COVID positivity.  Echo 06/2019, Left ventricular ejection fraction, by visual estimation, is 35 to 40%.  Stress test 03/2019 Pharmacological myocardial perfusion imaging study with no significant ischemia Small region mild fixed perfusion defect in the distal anteroseptal and apical region, unable to exclude old scar vs attenuation artifact EF estimated at 38%, septal wall hypokinesis, possibly secondary to CABG/post-operative state.  No EKG changes concerning for ischemia at peak stress or in recovery. Low risk scan  EKG personally reviewed by myself on todays visit Shows  normal sinus rhythm rate 69 bpm , no significant ST-T wave changes, left axis deviation  Orthostatics performed today were negative blood pressure 120-130  Other past medical history reviewed 05/2019 COVID , and hospital 10 days treated with remdesivir and steroids, antibodies little bit of leg swelling but he attributed to not taking diuretics   Back in the hospital again for an additional 10-day stay October 28 he started to run another fever up to 101.3 has not had any chest pain or wheezing no headaches. blood pressure has been  running low in the 90s so he stopped using his lisinopril. His blood pressure after that improved to 130s over 60s  CT chest,  . Diffuse multifocal areas of consolidation and ground-glass opacity throughout the lungs compatible with atypical infection in the setting of known COVID positivity.  LE arterial 08/2018,   Right lower extremity: Moderately to severely calcified SFA with diffuse disease throughout its course especially in the midsegment and three-vessel runoff below the knee.  AAA less than 3 cm, on scan in 2018 reviewed with him Numerous previous episode of chest pain relieved with belching consistent with GI etiology.  Previous cardiac catheterization 07/07/2015  Ost LM to LM lesion, 50% stenosed.  Ost RCA lesion, 80% stenosed.  Prox RCA-1 lesion, 80% stenosed.  Prox RCA-2 lesion, 80% stenosed.  LIMA was injected is moderate in size, and is anatomically normal.  Dist LAD lesion, 60% stenosed.  Ost LAD to Prox LAD lesion, 100% stenosed.  Mid LAD lesion, 60% stenosed.  The left ventricular systolic function is normal.  SVG was injected is normal in caliber, and is anatomically normal.  2nd Diag lesion, 50% stenosed.  severe RCA disease (small vessel) not amenable to PCI, moderate left main disease, moderate LAD and  proximal diagonal disease. 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  PSH:    Past Surgical History:  Procedure Laterality Date  . ABDOMINAL AORTOGRAM W/LOWER EXTREMITY [name] 09/12/2018   Procedure: ABDOMINAL AORTOGRAM W/LOWER EXTREMITY;  Surgeon: [name] Hampshire, MD;  Location: [facility];  Service: Cardiovascular;  Laterality: [name];  . APPENDECTOMY     rupture  . CARDIAC CATHETERIZATION [name] 07/07/2015   Procedure: Left Heart Cath and Cors/Grafts Angiography;  Surgeon: [name] Merritts, MD;  Location: [facility];  Service: Cardiovascular;  Laterality: [name];  . CATARACT EXTRACTION, BILATERAL    . CERVICAL SPINE SURGERY  12/2016   cervical stenosis [name])  . COLONOSCOPY  03/2010   HP polyp, diverticulosis, rpt 10 yrs ([name])  . CORONARY ARTERY BYPASS GRAFT  1990   3 vessel   . CYSTOSCOPY  12/23/10   [name]  . EYE SURGERY     cataract  . KNEE ARTHROSCOPY Right    x2  . LAMINOTOMY  1986   L5/S1 lumbar laminotomy for two ruptured discs/fusion  . LUMBAR LAMINECTOMY/DECOMPRESSION  MICRODISCECTOMY [name] 07/13/2016   Procedure: LUMBAR TWO-THREE, LUMBAR THREE-FOUR, LUMBAR FOUR-FIVE LAMINECTOMY AND FORAMINOTOMY;  Surgeon: [name] Pies, MD;  Location: [facility];  Service: Neurosurgery;  Laterality: [name];  LAMINECTOMY AND FORAMINOTOMY L2-L3, L3-L4,L4-L5  . TOTAL HIP ARTHROPLASTY Bilateral 1999,2000  . TOTAL KNEE ARTHROPLASTY Right 03/06/2017   Procedure: RIGHT TOTAL KNEE ARTHROPLASTY;  Surgeon: Gaynelle Arabian, MD;  Location: [facility];  Service: Orthopedics;  Laterality: Right;    Current Outpatient Medications  Medication Sig Dispense Refill  . acetaminophen (TYLENOL) 650 MG CR tablet Take 1,300 mg by mouth every 8 (eight) hours as needed for pain.    Marland Kitchen albuterol (VENTOLIN HFA) 108 (90 Base) MCG/ACT inhaler Inhale 2 puffs into the lungs every 6 (six) hours as needed for wheezing or shortness of breath. 18 g 3  . aspirin EC 81 MG EC tablet Take 1 tablet (81 mg total) by mouth daily. 30 tablet 0  . Cyanocobalamin (B-12) 1000 MCG SUBL Place 1 tablet under the tongue daily. (Patient taking differently: Place 1 tablet under the tongue daily with lunch. ) 30 each   . ezetimibe (ZETIA) 10 MG tablet TAKE ONE TABLET BY MOUTH EVERY DAY (Patient taking differently: Take 10 mg by mouth at bedtime. ) 90 tablet 3  . furosemide (LASIX) 20 MG tablet Take 1 tablet (20 mg total) by mouth daily. 30 tablet 3  . gabapentin (NEURONTIN) 300 MG capsule Take 300 mg by mouth 3 (three) times daily.     . isosorbide mononitrate (IMDUR) 30 MG 24 hr tablet Take 1 tablet (30 mg total) by mouth at bedtime.  90 tablet 3  . ketoconazole (NIZORAL) 2 % cream Apply 1 application topically daily as needed for irritation.     Marland Kitchen losartan (COZAAR) 25 MG tablet Take 1 tablet (25 mg total) by mouth daily. 90 tablet 3  . metoprolol succinate (TOPROL XL) 25 MG 24 hr tablet Take 0.5 tablets (12.5 mg total) by mouth daily. 45 tablet 3  . nitroGLYCERIN (NITROSTAT) 0.4 MG SL tablet Place 1 tablet (0.4 mg total) under the tongue every  5 (five) minutes as needed for chest pain. 25 tablet 3  . polyethylene glycol (MIRALAX / GLYCOLAX) 17 g packet Take 17 g by mouth every other day.     . simvastatin (ZOCOR) 40 MG tablet TAKE ONE TABLET BY MOUTH EVERY DAY (Patient taking differently: Take 40 mg by mouth at bedtime. ) 90 tablet 3  . tolterodine (DETROL LA) 4 MG 24 hr capsule Take 4 mg by mouth daily.    . ranolazine (RANEXA) 500 MG 12 hr tablet Take 1 tablet (500 mg total) by mouth 2 (two) times daily. 60 tablet 0   No current facility-administered medications for this visit.     Allergies:   Vioxx [rofecoxib], Contrast media [iodinated diagnostic agents], and Morphine   Social History:  The patient  reports that he quit smoking about 31 years ago. His smoking use included cigarettes. He has a 25.00 pack-year smoking history. He has never used smokeless tobacco. He reports that he does not drink alcohol or use drugs.   Family History:   family history includes Cancer in his sister and another family member; Diabetes in his brother and mother; Heart attack in his mother; Heart disease in his father; Kidney disease in his father and sister; Stroke in his father and mother.   Review of Systems: Review of Systems  Constitutional: Positive for malaise/fatigue.  HENT: Negative.   Respiratory: Negative.   Cardiovascular: Negative.   Gastrointestinal: Negative.   Musculoskeletal: Negative.   Neurological: Negative.   Psychiatric/Behavioral: Negative.   All other systems reviewed and are negative.   PHYSICAL EXAM: VS:  BP 115/65 (BP Location: Left Arm, Patient Position: Sitting, Cuff Size: Normal)   Pulse 69   Ht 6' (1.829 m)   Wt 203 lb (92.1 kg)   BMI 27.53 kg/m  , BMI Body mass index is 27.53 kg/m. Constitutional:  oriented to person, place, and time. No distress.  HENT:  Head: Grossly normal Eyes:  no discharge. No scleral icterus.  Neck: No JVD, no carotid bruits  Cardiovascular: Regular rate and rhythm, no  murmurs appreciated Pulmonary/Chest: Clear to auscultation bilaterally, no wheezes or rails Abdominal: Soft.  no distension.  no tenderness.  Musculoskeletal: Normal range of motion Neurological:  normal muscle tone. Coordination normal. No atrophy Skin: Skin warm and dry Psychiatric: normal affect, pleasant  Recent Labs: 06/19/2019: B Natriuretic Peptide 275.7 06/23/2019: ALT 71 06/24/2019: Magnesium 2.3 06/25/2019: BUN 37; Creatinine, Ser 1.02; Hemoglobin 11.1; Platelets 293; Potassium 4.1; Sodium 138    Lipid Panel Lab Results  Component Value Date   CHOL 109 06/04/2019   HDL 33 (L) 06/04/2019   LDLCALC 62 06/04/2019   TRIG 78 06/18/2019      Wt Readings from Last 3 Encounters:  09/10/19 203 lb (92.1 kg)  08/06/19 207 lb 4 oz (94 kg)  07/19/19 199 lb (90.3 kg)      ASSESSMENT AND PLAN: Cardiomyopathy Post covid, Inflammation in lung on CT scan November 2020 He does not feel well, unclear etiology He does  not want to change medications today CMP today.  He is sometimes taking extra Lasix Will schedule for echocardiogram in March before his next visit  Covid , recovering Recovering from infection 05/2019 November 2020 Feels worse recently, thought he was better last month  Mixed hyperlipidemia Numbers at goal, no changes made  Essential hypertension No changes made, blood pressure stable  Atherosclerosis of coronary artery bypass graft of native heart with stable angina pectoris (HCC) No angina  Bilateral carotid artery stenosis Mild bilateral carotid disease less than 39% bilaterally Continue aggressive lipid management  Diet-controlled diabetes mellitus ([place]) Weight stable Higher numbers with prednisone  Ex-smoker Nonsmoker  PAD Follows with Dr. [name] ,  bilateral SFA disease Cold feet and hands Symptoms stable today   Disposition:   F/U  3 month   Total encounter time more than 25 minutes  Greater than 50% was spent in counseling and  coordination of care with the patient    Orders Placed This Encounter  Procedures  . EKG 12-Lead     Signed, Esmond Plants, M.D., Ph.D. 09/10/2019  [place], [place]

## 2019-09-10 NOTE — Patient Instructions (Addendum)
Medication Instructions:  No changes  If you need a refill on your cardiac medications before your next appointment, please call your pharmacy.    Lab work: CMP today   If you have labs (blood work) drawn today and your tests are completely normal, you will receive your results only by: Marland Kitchen MyChart Message (if you have MyChart) OR . A paper copy in the mail If you have any lab test that is abnormal or we need to change your treatment, we will call you to review the results.   Testing/Procedures:  Please schedule this prior to his next appointment.   Your physician has requested that you have an echocardiogram. Echocardiography is a painless test that uses sound waves to create images of your heart. It provides your doctor with information about the size and shape of your heart and how well your heart's chambers and valves are working. This procedure takes approximately one hour. There are no restrictions for this procedure.     Follow-Up: At [facility], you and your health needs are our priority.  As part of our continuing mission to provide you with exceptional heart care, we have created designated Provider Care Teams.  These Care Teams include your primary Cardiologist (physician) and Advanced Practice Providers (APPs -  Physician Assistants and Nurse Practitioners) who all work together to provide you with the care you need, when you need it.  . You will need a follow up appointment in 3 months  . 11/04/19 at 09:00 AM with Dr. Rockey Situ   . Providers on your designated Care Team:   . Murray Hodgkins, NP . Christell Faith, PA-C . Marrianne Mood, PA-C  Any Other Special Instructions Will Be Listed Below (If Applicable).  For educational health videos Log in to : [email or web address] Or : [email or web address], password : [email or web address]

## 2019-09-11 LAB — COMPREHENSIVE METABOLIC PANEL
ALT: 15 IU/L (ref 0–44)
AST: 16 IU/L (ref 0–40)
Albumin/Globulin Ratio: 2 (ref 1.2–2.2)
Albumin: 4.3 g/dL (ref 3.7–4.7)
Alkaline Phosphatase: 35 IU/L — ABNORMAL LOW (ref 39–117)
BUN/Creatinine Ratio: 20 (ref 10–24)
BUN: 29 mg/dL — ABNORMAL HIGH (ref 8–27)
Bilirubin Total: 0.3 mg/dL (ref 0.0–1.2)
CO2: 22 mmol/L (ref 20–29)
Calcium: 9.1 mg/dL (ref 8.6–10.2)
Chloride: 102 mmol/L (ref 96–106)
Creatinine, Ser: 1.44 mg/dL — ABNORMAL HIGH (ref 0.76–1.27)
GFR calc Af Amer: 53 mL/min/{1.73_m2} — ABNORMAL LOW (ref >59)
GFR calc non Af Amer: 46 mL/min/{1.73_m2} — ABNORMAL LOW (ref >59)
Globulin, Total: 2.1 g/dL (ref 1.5–4.5)
Glucose: 124 mg/dL — ABNORMAL HIGH (ref 65–99)
Potassium: 4.7 mmol/L (ref 3.5–5.2)
Sodium: 139 mmol/L (ref 134–144)
Total Protein: 6.4 g/dL (ref 6.0–8.5)

## 2019-09-13 ENCOUNTER — Other Ambulatory Visit: Payer: PPO

## 2019-09-18 ENCOUNTER — Telehealth: Payer: Self-pay | Admitting: *Deleted

## 2019-09-18 NOTE — Telephone Encounter (Signed)
Spoke with patient and reviewed provider recommendations. He states that he doesn't take this medication unless needed and that is not much. Reviewed instructions and he verbalized understanding with no further questions at this time.

## 2019-09-18 NOTE — Telephone Encounter (Signed)
-----   Message from [name], MD sent at 09/18/2019  1:22 PM EST ----- Lab work reviewed Creatinine above baseline consistent with mild dehydration On last clinic visit reported he was taking extra Lasix Would go back to prescription dose, not take any extra

## 2019-09-20 ENCOUNTER — Other Ambulatory Visit: Payer: Self-pay

## 2019-09-20 ENCOUNTER — Ambulatory Visit (INDEPENDENT_AMBULATORY_CARE_PROVIDER_SITE_OTHER)
Admission: RE | Admit: 2019-09-20 | Discharge: 2019-09-20 | Disposition: A | Discharge status: Home / Self Care | Payer: PPO | Source: Ambulatory Visit | Attending: Family Medicine | Admitting: Family Medicine

## 2019-09-20 ENCOUNTER — Encounter: Payer: Self-pay | Admitting: Family Medicine

## 2019-09-20 ENCOUNTER — Ambulatory Visit (INDEPENDENT_AMBULATORY_CARE_PROVIDER_SITE_OTHER): Payer: PPO | Admitting: Family Medicine

## 2019-09-20 VITALS — BP 120/60 | HR 60 | Temp 97.6°F | Ht 72.0 in

## 2019-09-20 DIAGNOSIS — M533 Sacrococcygeal disorders, not elsewhere classified: Secondary | ICD-10-CM

## 2019-09-20 NOTE — Assessment & Plan Note (Signed)
Of unclear etiology - denies inciting trauma. Check films today.  Supportive care reviewed as per instructions

## 2019-09-20 NOTE — Patient Instructions (Signed)
Xray of tailbone today Avoid hard surfaces.  Consider carrying soft pillow   Tailbone Injury  The tailbone (coccyx) is the small bone at the lower end of the spine. A tailbone injury may involve stretched ligaments, bruising, or a broken bone (fracture). Tailbone injuries can be painful, and some may take a long time to heal. What are the causes? This condition may be caused by:  Falling and landing on the tailbone.  Repeated strain or friction from sitting for long periods of time. This may include actions such as rowing and bicycling.  Childbirth. In some cases, the cause may not be known. What are the signs or symptoms? Symptoms of this condition include:  Pain in the tailbone area or lower back, especially when sitting.  Pain or difficulty when standing up from a sitting position.  Bruising or swelling in the tailbone area.  Painful bowel movements.  In women, pain during intercourse. How is this diagnosed? This condition may be diagnosed based on:  Your symptoms.  A physical exam. If your health care provider suspects a fracture, you may have additional tests, such as:  X-rays.  CT scan.  MRI. How is this treated? Most tailbone injuries heal on their own in 4-6 weeks. However, recovery time may be longer if the injury involves a fracture. Treatment for this condition may include:  NSAIDs or other over-the-counter medicines to help relieve your pain.  Using a large, rubber or inflated ring or cushion to take pressure off the tailbone when sitting.  Physical therapy.  Injecting the tailbone area with local anesthesia and steroid medicine. This is not normally needed unless the pain does not improve over time with over-the-counter pain medicines. Follow these instructions at home: Activity  Avoid sitting for long periods of time.  To prevent repeating an injury that is caused by strain or friction: ? Wear appropriate padding and sports gear when bicycling  and rowing.  Increase your activity as the pain allows. Perform any exercises that are recommended by your health care provider or physical therapist. Managing pain, stiffness, and swelling  To help decrease discomfort when sitting: ? Sit on your rubber or inflated ring or cushion as told by your health care provider. ? Lean forward when you sit.  If directed, apply ice to the injured area: ? Put ice in a plastic bag. ? Place a towel between your skin and the bag. ? Leave the ice on for 20 minutes, 2-3 times per day for the first 1-2 days.  If directed, apply heat to the affected area as often as told by your health care provider. Use the heat source that your health care provider recommends, such as a moist heat pack or a heating pad. ? Place a towel between your skin and the heat source. ? Leave the heat on for 20-30 minutes. ? Remove the heat if your skin turns bright red. This is especially important if you are unable to feel pain, heat, or cold. You may have a greater risk of getting burned. General instructions  Take over-the-counter and prescription medicines only as told by your health care provider.  To prevent or treat constipation or painful bowel movements, your health care provider may recommend that you: ? Drink enough fluid to keep your urine pale yellow. ? Eat foods that are high in fiber, such as fresh fruits and vegetables, whole grains, and beans. ? Limit foods that are high in fat and processed sugars, such as fried and sweet foods. ?  Take an over-the-counter or prescription medicine for constipation.  Keep all follow-up visits as directed by your health care provider. This is important. Contact a health care provider if:  Your pain becomes worse or is not controlled with medicine.  Your bowel movements cause a great deal of discomfort.  You are unable to have a bowel movement after 4 days.  You have pain during intercourse. Summary  A tailbone injury may  involve stretched ligaments, bruising, or a broken bone (fracture).  Tailbone injuries can be painful. Most heal on their own in 4-6 weeks.  Treatment may include taking NSAIDs, using a rubber or inflated ring or cushion when sitting, and physical therapy.  Follow any recommendations from your health care provider to prevent or treat constipation. This information is not intended to replace advice given to you by your health care provider. Make sure you discuss any questions you have with your health care provider. Document Revised: 08/29/2017 Document Reviewed: 08/29/2017 Elsevier Patient Education  2020 [employer].

## 2019-09-20 NOTE — Progress Notes (Signed)
This visit was conducted in person.  BP 120/60 (BP Location: Left Arm, Patient Position: Sitting, Cuff Size: Normal)   Pulse 60   Temp 97.6 F (36.4 C) (Temporal)   Ht 6' (1.829 m)   SpO2 96%   BMI 27.53 kg/m    CC: tailbone pain Subjective:    Patient ID: [name], male    DOB: 01/05/1941, 79 y.o.   MRN: [id number]  HPI: [name] is a 79 y.o. male presenting on 09/20/2019 for Tailbone Pain (C/o painful tailbone.  Started about 1 wk ago while in hospital.  Area was raw but that improved.  )   4d h/o tailbone pain - painful to sit. Raw skin present. Denies inciting trauma/injury or fall. No lower back pain, no shooting pain down legs. No urinary symptoms or bowel changes. Worse with prolonged sitting or supine  Had similar pain when in hospital that resolved on its own.   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 He was also treated for COPD exacerbation and acute CHF exacerbation while hospitalized with Covid. Planned rpt echo 10/2019- recently saw Dr Rockey Situ.   Worse memory since covid illness late last year.      Relevant past medical, surgical, family and social history reviewed and updated as indicated. Interim medical history since our last visit reviewed. Allergies and medications reviewed and updated. Outpatient Medications Prior to Visit  Medication Sig Dispense Refill  . acetaminophen (TYLENOL) 650 MG CR tablet Take 1,300 mg by mouth every 8 (eight) hours as needed for pain.    Marland Kitchen albuterol (VENTOLIN HFA) 108 (90 Base) MCG/ACT inhaler Inhale 2 puffs into the lungs every 6 (six) hours as needed for wheezing or shortness of breath. 18 g 3  . aspirin EC 81 MG EC tablet Take 1 tablet (81 mg total) by mouth daily. 30 tablet 0  . Cyanocobalamin (B-12) 1000 MCG SUBL Place 1 tablet under the tongue daily. (Patient taking differently: Place 1 tablet under the tongue daily with lunch. ) 30 each   . ezetimibe (ZETIA) 10 MG tablet TAKE  ONE TABLET BY MOUTH EVERY DAY (Patient taking differently: Take 10 mg by mouth at bedtime. ) 90 tablet 3  . furosemide (LASIX) 20 MG tablet Take 1 tablet (20 mg total) by mouth daily. 30 tablet 3  . gabapentin (NEURONTIN) 300 MG capsule Take 300 mg by mouth 3 (three) times daily.     . isosorbide mononitrate (IMDUR) 30 MG 24 hr tablet Take 1 tablet (30 mg total) by mouth at bedtime. 90 tablet 3  . ketoconazole (NIZORAL) 2 % cream Apply 1 application topically daily as needed for irritation.     Marland Kitchen losartan (COZAAR) 25 MG tablet Take 1 tablet (25 mg total) by mouth daily. 90 tablet 3  . metoprolol succinate (TOPROL XL) 25 MG 24 hr tablet Take 0.5 tablets (12.5 mg total) by mouth daily. 45 tablet 3  . nitroGLYCERIN (NITROSTAT) 0.4 MG SL tablet Place 1 tablet (0.4 mg total) under the tongue every 5 (five) minutes as needed for chest pain. 25 tablet 3  . polyethylene glycol (MIRALAX / GLYCOLAX) 17 g packet Take 17 g by mouth every other day.     . simvastatin (ZOCOR) 40 MG tablet TAKE ONE TABLET BY MOUTH EVERY DAY (Patient taking differently: Take 40 mg by mouth at bedtime. ) 90 tablet 3  . tolterodine (DETROL LA) 4 MG 24 hr capsule Take 4 mg by mouth daily.    Marland Kitchen  ranolazine (RANEXA) 500 MG 12 hr tablet Take 1 tablet (500 mg total) by mouth 2 (two) times daily. 60 tablet 0   No facility-administered medications prior to visit.     Per HPI unless specifically indicated in ROS section below Review of Systems Objective:    BP 120/60 (BP Location: Left Arm, Patient Position: Sitting, Cuff Size: Normal)   Pulse 60   Temp 97.6 F (36.4 C) (Temporal)   Ht 6' (1.829 m)   SpO2 96%   BMI 27.53 kg/m   Wt Readings from Last 3 Encounters:  09/10/19 203 lb (92.1 kg)  08/06/19 207 lb 4 oz (94 kg)  07/19/19 199 lb (90.3 kg)    Physical Exam Vitals and nursing note reviewed.  Constitutional:      Appearance: Normal appearance. He is not ill-appearing.     Comments: Tired appearing  Musculoskeletal:         General: Tenderness present. No swelling. Normal range of motion.     Comments:  No pain midline spine No paraspinous mm tenderness Neg SLR bilaterally. No pain with int/ext rotation at hip. Point tender at distal coccyx tip  Skin:    General: Skin is warm and dry.     Findings: No erythema or rash.     Comments: No perianal erythema or open sores noted  Neurological:     Mental Status: He is alert.       Results for orders placed or performed in visit on 09/10/19  Comp Met (CMET)  Result Value Ref Range   Glucose 124 (H) 65 - 99 mg/dL   BUN 29 (H) 8 - 27 mg/dL   Creatinine, Ser 1.44 (H) 0.76 - 1.27 mg/dL   GFR calc non Af Amer 46 (L) >59 mL/min/1.73   GFR calc Af Amer 53 (L) >59 mL/min/1.73   BUN/Creatinine Ratio 20 10 - 24   Sodium 139 134 - 144 mmol/L   Potassium 4.7 3.5 - 5.2 mmol/L   Chloride 102 96 - 106 mmol/L   CO2 22 20 - 29 mmol/L   Calcium 9.1 8.6 - 10.2 mg/dL   Total Protein 6.4 6.0 - 8.5 g/dL   Albumin 4.3 3.7 - 4.7 g/dL   Globulin, Total 2.1 1.5 - 4.5 g/dL   Albumin/Globulin Ratio 2.0 1.2 - 2.2   Bilirubin Total 0.3 0.0 - 1.2 mg/dL   Alkaline Phosphatase 35 (L) 39 - 117 IU/L   AST 16 0 - 40 IU/L   ALT 15 0 - 44 IU/L   Assessment & Plan:  This visit occurred during the SARS-CoV-2 public health emergency.  Safety protocols were in place, including screening questions prior to the visit, additional usage of staff PPE, and extensive cleaning of exam room while observing appropriate contact time as indicated for disinfecting solutions.   Problem List Items Addressed This Visit    Coccydynia - Primary    Of unclear etiology - denies inciting trauma. Check films today.  Supportive care reviewed as per instructions      Relevant Orders   DG Sacrum/Coccyx       No orders of the defined types were placed in this encounter.  Orders Placed This Encounter  Procedures  . DG Sacrum/Coccyx    Standing Status:   Future    Number of Occurrences:   1     Standing Expiration Date:   11/17/2020    Order Specific Question:   Reason for Exam (SYMPTOM  OR DIAGNOSIS REQUIRED)    Answer:  coccyx pain    Order Specific Question:   Preferred imaging location?    Answer:   [facility]    Order Specific Question:   Radiology Contrast Protocol - do NOT remove file path    Answer:   [email or web address]    Patient instructions: Xray of tailbone today Avoid hard surfaces.  Consider carrying soft pillow   Follow up plan: Return if symptoms worsen or fail to improve.  Ria Bush, MD

## 2019-09-25 ENCOUNTER — Other Ambulatory Visit: Payer: Self-pay

## 2019-09-25 ENCOUNTER — Ambulatory Visit (INDEPENDENT_AMBULATORY_CARE_PROVIDER_SITE_OTHER): Payer: PPO | Admitting: Family Medicine

## 2019-09-25 ENCOUNTER — Encounter: Payer: Self-pay | Admitting: Family Medicine

## 2019-09-25 VITALS — BP 122/62 | HR 63 | Temp 97.9°F | Ht 72.0 in

## 2019-09-25 DIAGNOSIS — M533 Sacrococcygeal disorders, not elsewhere classified: Secondary | ICD-10-CM

## 2019-09-25 DIAGNOSIS — M5116 Intervertebral disc disorders with radiculopathy, lumbar region: Secondary | ICD-10-CM | POA: Diagnosis not present

## 2019-09-25 DIAGNOSIS — Z8616 Personal history of COVID-19: Secondary | ICD-10-CM | POA: Diagnosis not present

## 2019-09-25 DIAGNOSIS — I43 Cardiomyopathy in diseases classified elsewhere: Secondary | ICD-10-CM

## 2019-09-25 DIAGNOSIS — E118 Type 2 diabetes mellitus with unspecified complications: Secondary | ICD-10-CM

## 2019-09-25 DIAGNOSIS — U071 COVID-19: Secondary | ICD-10-CM

## 2019-09-25 LAB — POCT GLYCOSYLATED HEMOGLOBIN (HGB A1C): Hemoglobin A1C: 5.7 % — AB (ref 4.0–5.6)

## 2019-09-25 NOTE — Assessment & Plan Note (Signed)
Great control - he has been working on this at home.  Congratulated on this - motivated to continue following diabetic diet.  RTC 3 mo AMW/CPE.

## 2019-09-25 NOTE — Patient Instructions (Addendum)
Schedule physical/wellness visit after 4/24. Congratulations on great sugar control! Continue diabetic diet.

## 2019-09-25 NOTE — Assessment & Plan Note (Signed)
Has been advised to postpone lumbar surgery for at least 8 months to allow heart function to improve (covid cardiomyopathy). Asks we take over gabapentin Rx - ok to do - will let us know when he needs refill.

## 2019-09-25 NOTE — Assessment & Plan Note (Signed)
Slightly improved - continue using pillow for prolonged sitting.

## 2019-09-25 NOTE — Progress Notes (Signed)
This visit was conducted in person.  BP 122/62 (BP Location: Right Arm, Patient Position: Sitting, Cuff Size: Normal)   Pulse 63   Temp 97.9 F (36.6 C) (Temporal)   Ht 6' (1.829 m)   SpO2 94%   BMI 27.53 kg/m    CC: 3 mo DM f/u visit Subjective:    Patient ID: [name], male    DOB: 1941/05/04, 79 y.o.   MRN: [id number]  HPI: [name] is a 79 y.o. male presenting on 09/25/2019 for Diabetes (Here for 3 mo f/u.)   Coccydynia - seen last week with reassuring xrays, rec donut pillow when sitting.   Breathing stable post covid illness late last year.   Asks for me to take over gabapentin prescription (previously prescribed by Dr [name] neurosurgery).  DM - does regularly check sugars fasting 100-110s, postprandial 165. Compliant with antihyperglycemic regimen which includes: diabetic diet. Sees podiatry in March. Denies low sugars or hypoglycemic symptoms. 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 Last diabetic eye exam 08/2019. Pneumovax: 2010. Prevnar: 2016. Glucometer brand: GE. DSME: 10/2017 [facility].  Lab Results  Component Value Date   HGBA1C 5.7 (A) 09/25/2019   Diabetic Foot Exam - Simple   Simple Foot Form Diabetic Foot exam was performed with the following findings: Yes 09/25/2019 12:44 PM  Visual Inspection No deformities, no ulcerations, no other skin breakdown bilaterally: Yes Sensation Testing Intact to touch and monofilament testing bilaterally: Yes Pulse Check Posterior Tibialis and Dorsalis pulse intact bilaterally: Yes Comments    Lab Results  Component Value Date   MICROALBUR <0.7 12/04/2018         Relevant past medical, surgical, family and social history reviewed and updated as indicated. Interim medical history since our last visit reviewed. Allergies and medications reviewed and updated. Outpatient Medications Prior to Visit  Medication Sig Dispense Refill  . acetaminophen (TYLENOL) 650 MG CR tablet Take  1,300 mg by mouth every 8 (eight) hours as needed for pain.    Marland Kitchen albuterol (VENTOLIN HFA) 108 (90 Base) MCG/ACT inhaler Inhale 2 puffs into the lungs every 6 (six) hours as needed for wheezing or shortness of breath. 18 g 3  . aspirin EC 81 MG EC tablet Take 1 tablet (81 mg total) by mouth daily. 30 tablet 0  . Cyanocobalamin (B-12) 1000 MCG SUBL Place 1 tablet under the tongue daily. (Patient taking differently: Place 1 tablet under the tongue daily with lunch. ) 30 each   . ezetimibe (ZETIA) 10 MG tablet TAKE ONE TABLET BY MOUTH EVERY DAY (Patient taking differently: Take 10 mg by mouth at bedtime. ) 90 tablet 3  . furosemide (LASIX) 20 MG tablet Take 1 tablet (20 mg total) by mouth daily. 30 tablet 3  . gabapentin (NEURONTIN) 300 MG capsule Take 300 mg by mouth 3 (three) times daily.     . isosorbide mononitrate (IMDUR) 30 MG 24 hr tablet Take 1 tablet (30 mg total) by mouth at bedtime. 90 tablet 3  . ketoconazole (NIZORAL) 2 % cream Apply 1 application topically daily as needed for irritation.     Marland Kitchen losartan (COZAAR) 25 MG tablet Take 1 tablet (25 mg total) by mouth daily. 90 tablet 3  . metoprolol succinate (TOPROL XL) 25 MG 24 hr tablet Take 0.5 tablets (12.5 mg total) by mouth daily. 45 tablet 3  . nitroGLYCERIN (NITROSTAT) 0.4 MG SL tablet Place 1 tablet (0.4 mg total) under the tongue every 5 (five) minutes as needed  for chest pain. 25 tablet 3  . polyethylene glycol (MIRALAX / GLYCOLAX) 17 g packet Take 17 g by mouth every other day.     . simvastatin (ZOCOR) 40 MG tablet TAKE ONE TABLET BY MOUTH EVERY DAY (Patient taking differently: Take 40 mg by mouth at bedtime. ) 90 tablet 3  . tolterodine (DETROL LA) 4 MG 24 hr capsule Take 4 mg by mouth daily.    . ranolazine (RANEXA) 500 MG 12 hr tablet Take 1 tablet (500 mg total) by mouth 2 (two) times daily. 60 tablet 0   No facility-administered medications prior to visit.     Per HPI unless specifically indicated in ROS section  below Review of Systems Objective:    BP 122/62 (BP Location: Right Arm, Patient Position: Sitting, Cuff Size: Normal)   Pulse 63   Temp 97.9 F (36.6 C) (Temporal)   Ht 6' (1.829 m)   SpO2 94%   BMI 27.53 kg/m   Wt Readings from Last 3 Encounters:  09/10/19 203 lb (92.1 kg)  08/06/19 207 lb 4 oz (94 kg)  07/19/19 199 lb (90.3 kg)    Physical Exam Vitals and nursing note reviewed.  Constitutional:      General: He is not in acute distress.    Appearance: Normal appearance. He is well-developed. He is not ill-appearing.  HENT:     Mouth/Throat:     Pharynx: No oropharyngeal exudate.  Eyes:     General: No scleral icterus.    Conjunctiva/sclera: Conjunctivae normal.     Pupils: Pupils are equal, round, and reactive to light.  Cardiovascular:     Rate and Rhythm: Normal rate and regular rhythm.     Heart sounds: Normal heart sounds. No murmur.  Pulmonary:     Effort: Pulmonary effort is normal. No respiratory distress.     Breath sounds: Normal breath sounds. No wheezing or rales.  Musculoskeletal:     Cervical back: Normal range of motion and neck supple.     Comments: See HPI for foot exam if done  Lymphadenopathy:     Cervical: No cervical adenopathy.  Skin:    General: Skin is warm and dry.     Findings: No rash.  Neurological:     Mental Status: He is alert.       Results for orders placed or performed in visit on 09/25/19  POCT glycosylated hemoglobin (Hb A1C)  Result Value Ref Range   Hemoglobin A1C 5.7 (A) 4.0 - 5.6 %   HbA1c POC (<> result, manual entry)     HbA1c, POC (prediabetic range)     HbA1c, POC (controlled diabetic range)     Assessment & Plan:  This visit occurred during the SARS-CoV-2 public health emergency.  Safety protocols were in place, including screening questions prior to the visit, additional usage of staff PPE, and extensive cleaning of exam room while observing appropriate contact time as indicated for disinfecting solutions.    Problem List Items Addressed This Visit    Lumbar disc disease with radiculopathy    Has been advised to postpone lumbar surgery for at least 8 months to allow heart function to improve (covid cardiomyopathy). Asks we take over gabapentin Rx - ok to do - will let us know when he needs refill.       Controlled diabetes mellitus type 2 with complications ([place]) - Primary    Great control - he has been working on this at home.  Congratulated on this - motivated  to continue following diabetic diet.  RTC 3 mo AMW/CPE.       Relevant Orders   POCT glycosylated hemoglobin (Hb A1C) (Completed)   Coccydynia    Slightly improved - continue using pillow for prolonged sitting.       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  Orders Placed This Encounter  Procedures  . POCT glycosylated hemoglobin (Hb A1C)    Follow up plan: Return in about 3 months (around 12/23/2019) for annual exam, prior fasting for blood work, medicare wellness visit.  Ria Bush, MD

## 2019-09-28 ENCOUNTER — Ambulatory Visit: Payer: PPO | Attending: Internal Medicine

## 2019-09-28 DIAGNOSIS — Z23 Encounter for immunization: Secondary | ICD-10-CM | POA: Insufficient documentation

## 2019-09-28 NOTE — Progress Notes (Signed)
   Covid-19 Vaccination Clinic  Name:  [name]    MRN: [id number] DOB: 10-28-1940  09/28/2019  Mr. Guynes was observed post Covid-19 immunization for 15 minutes without incidence. He was provided with Vaccine Information Sheet and instruction to access the V-Safe system.   Mr. Mansoor was instructed to call 911 with any severe reactions post vaccine: Marland Kitchen Difficulty breathing  . Swelling of your face and throat  . A fast heartbeat  . A bad rash all over your body  . Dizziness and weakness    Immunizations Administered    Name Date Dose VIS Date Route   Pfizer COVID-19 Vaccine 09/28/2019 11:41 AM 0.3 mL 07/26/2019 Intramuscular   Manufacturer: [place]   Lot: [id number]   [place]: [id number]

## 2019-10-02 DIAGNOSIS — M533 Sacrococcygeal disorders, not elsewhere classified: Secondary | ICD-10-CM | POA: Diagnosis not present

## 2019-10-04 ENCOUNTER — Ambulatory Visit (INDEPENDENT_AMBULATORY_CARE_PROVIDER_SITE_OTHER): Payer: PPO

## 2019-10-04 ENCOUNTER — Other Ambulatory Visit: Payer: Self-pay

## 2019-10-04 DIAGNOSIS — U071 COVID-19: Secondary | ICD-10-CM

## 2019-10-04 DIAGNOSIS — I43 Cardiomyopathy in diseases classified elsewhere: Secondary | ICD-10-CM | POA: Diagnosis not present

## 2019-10-10 DIAGNOSIS — M5136 Other intervertebral disc degeneration, lumbar region: Secondary | ICD-10-CM | POA: Diagnosis not present

## 2019-10-10 DIAGNOSIS — M533 Sacrococcygeal disorders, not elsewhere classified: Secondary | ICD-10-CM | POA: Diagnosis not present

## 2019-10-11 DIAGNOSIS — M961 Postlaminectomy syndrome, not elsewhere classified: Secondary | ICD-10-CM | POA: Diagnosis not present

## 2019-10-11 DIAGNOSIS — M5416 Radiculopathy, lumbar region: Secondary | ICD-10-CM | POA: Diagnosis not present

## 2019-10-11 DIAGNOSIS — M48061 Spinal stenosis, lumbar region without neurogenic claudication: Secondary | ICD-10-CM | POA: Diagnosis not present

## 2019-10-11 DIAGNOSIS — M7138 Other bursal cyst, other site: Secondary | ICD-10-CM | POA: Diagnosis not present

## 2019-10-21 NOTE — Progress Notes (Signed)
Cardiology Office Note  Date:  10/22/2019   ID:  [name], DOB 28-Sep-1940, MRN [id number]  PCP:  [name] Bush, MD   Chief Complaint  Patient presents with  . office visit    3 month F/U-Discuss getting back surgery; Meds verbally reviewerd with patient.    HPI:  79 year old gentleman with hx of OSA, did not tolerate CPAP mild  bilateral carotid arterial disease  <39% bilaterally 08/2016 hyperlipidemia  coronary artery disease,  bypass in 1990,  stenting to the circumflex in 2002,  Last cath 06/2015, patent grafts x 2, RCA small vessel diffuse disease DVT in the left lower extremity,  Completed back surgery for spinal stenosis, 06/20/2016 Cervical neck surgery 01/2017, [name]   total knee surgery in 02/2017 covid 05/20/2019 who presents for routine followup Of his coronary artery disease, post  covid  On last clinic visit 3 months ago did not feel well Tired, had recently had vaccine at that time EF was 35 to 40% in 06/2019  On ARB, b-blocker, lasix, Feels better No chest pain, denies shortness of breath on exertion BP 123456 systolic Weight at home [id number]  Repeat echo Echo February 2021  1. Left ventricular ejection fraction, by estimation, is 50 to 55%. The  left ventricle has low normal function. The left ventricle has no regional  wall motion abnormalities. Left ventricular diastolic parameters are  consistent with Grade I diastolic dysfunction (impaired relaxation).  2. Right ventricular systolic function is normal. The right ventricular  size is normal. There is normal pulmonary artery systolic pressure.   HBA1C 5.7 CR 1.44, on lasix   EKG personally reviewed by myself on todays visit Shows normal sinus rhythm rate 67 bpm , no significant ST-T wave changes, left axis deviation  Other records reviewed  CT chest 06/2019 Diffuse multifocal areas of consolidation and ground-glass opacity throughout the lungs compatible with atypical infection  in the setting of known COVID positivity.  Stress test 03/2019 Pharmacological myocardial perfusion imaging study with no significant ischemia Small region mild fixed perfusion defect in the distal anteroseptal and apical region, unable to exclude old scar vs attenuation artifact EF estimated at 38%, septal wall hypokinesis, possibly secondary to CABG/post-operative state.  No EKG changes concerning for ischemia at peak stress or in recovery. Low risk scan  05/2019 COVID , and hospital 10 days treated with remdesivir and steroids, antibodies little bit of leg swelling but he attributed to not taking diuretics   Back in the hospital again for an additional 10-day stay October 28 he started to run another fever up to 101.3 has not had any chest pain or wheezing no headaches. blood pressure has been  running low in the 90s so he stopped using his lisinopril. His blood pressure after that improved to 130s over 60s  CT chest,  . Diffuse multifocal areas of consolidation and ground-glass opacity throughout the lungs compatible with atypical infection in the setting of known COVID positivity.  LE arterial 08/2018,   Right lower extremity: Moderately to severely calcified SFA with diffuse disease throughout its course especially in the midsegment and three-vessel runoff below the knee.  AAA less than 3 cm, on scan in 2018 reviewed with him Numerous previous episode of chest pain relieved with belching consistent with GI etiology.  Previous cardiac catheterization 07/07/2015  Ost LM to LM lesion, 50% stenosed.  Ost RCA lesion, 80% stenosed.  Prox RCA-1 lesion, 80% stenosed.  Prox RCA-2 lesion, 80% stenosed.  LIMA was injected is moderate  in size, and is anatomically normal.  Dist LAD lesion, 60% stenosed.  Ost LAD to Prox LAD lesion, 100% stenosed.  Mid LAD lesion, 60% stenosed.  The left  ventricular systolic function is normal.  SVG was injected is normal in caliber, and is anatomically normal.  2nd Diag lesion, 50% stenosed.  severe RCA disease (small vessel) not amenable to PCI, moderate left main disease, moderate LAD and proximal diagonal disease. 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  PSH:    Past Surgical History:  Procedure Laterality Date  . ABDOMINAL AORTOGRAM W/LOWER EXTREMITY [name] 09/12/2018   Procedure: ABDOMINAL AORTOGRAM W/LOWER EXTREMITY;  Surgeon: [name] Hampshire, MD;  Location: [name];  Service: Cardiovascular;  Laterality: [name];  . APPENDECTOMY     rupture  . CARDIAC CATHETERIZATION [name] 07/07/2015   Procedure: Left Heart Cath and Cors/Grafts Angiography;  Surgeon: [name] Merritts, MD;  Location: [name];  Service: Cardiovascular;  Laterality: [name];  . CATARACT EXTRACTION, BILATERAL    . CERVICAL SPINE SURGERY  12/2016   cervical stenosis [name])  . COLONOSCOPY  03/2010   HP polyp,  diverticulosis, rpt 10 yrs ([name])  . CORONARY ARTERY BYPASS GRAFT  1990   3 vessel   . CYSTOSCOPY  12/23/10   [name]  . EYE SURGERY     cataract  . KNEE ARTHROSCOPY Right    x2  . LAMINOTOMY  1986   L5/S1 lumbar laminotomy for two ruptured discs/fusion  . LUMBAR LAMINECTOMY/DECOMPRESSION MICRODISCECTOMY [name] 07/13/2016   Procedure: LUMBAR TWO-THREE, LUMBAR THREE-FOUR, LUMBAR FOUR-FIVE LAMINECTOMY AND FORAMINOTOMY;  Surgeon: [name], MD;  Location: [facility];  Service: Neurosurgery;  Laterality: [name];  LAMINECTOMY AND FORAMINOTOMY L2-L3, L3-L4,L4-L5  . TOTAL HIP ARTHROPLASTY Bilateral 1999,2000  . TOTAL KNEE ARTHROPLASTY Right 03/06/2017   Procedure: RIGHT TOTAL KNEE ARTHROPLASTY;  Surgeon: Gaynelle Arabian, MD;  Location: [facility];  Service: Orthopedics;  Laterality: Right;    Current Outpatient Medications  Medication Sig Dispense Refill  . acetaminophen (TYLENOL) 650 MG CR tablet Take 1,300 mg by mouth every 8 (eight) hours as needed for pain.    Marland Kitchen albuterol (VENTOLIN HFA) 108 (90 Base) MCG/ACT inhaler Inhale 2 puffs into the lungs every 6 (six) hours as needed for wheezing or shortness of breath. 18 g 3  . aspirin EC 81 MG EC tablet Take 1 tablet (81 mg total) by mouth daily. 30 tablet 0  . Cyanocobalamin (B-12) 1000 MCG SUBL Place 1 tablet under the tongue daily. (Patient taking differently: Place 1 tablet under the tongue daily with lunch. ) 30 each   . ezetimibe (ZETIA) 10 MG tablet TAKE ONE TABLET BY MOUTH EVERY DAY (Patient taking differently: Take 10 mg by mouth at bedtime. ) 90 tablet 3  . furosemide (LASIX) 20 MG tablet Take 1  tablet (20 mg total) by mouth daily. 30 tablet 3  . gabapentin (NEURONTIN) 300 MG capsule Take 300 mg by mouth 3 (three) times daily.     . isosorbide mononitrate (IMDUR) 30 MG 24 hr tablet Take 1 tablet (30 mg total) by mouth at bedtime. 90 tablet 3  . ketoconazole (NIZORAL) 2 % cream Apply 1 application topically daily as needed for irritation.     Marland Kitchen  losartan (COZAAR) 25 MG tablet Take 1 tablet (25 mg total) by mouth daily. 90 tablet 3  . metoprolol succinate (TOPROL XL) 25 MG 24 hr tablet Take 0.5 tablets (12.5 mg total) by mouth daily. 45 tablet 3  . nitroGLYCERIN (NITROSTAT) 0.4 MG SL tablet Place 1 tablet (0.4 mg total) under the tongue every 5 (five) minutes as needed for chest pain. 25 tablet 3  . polyethylene glycol (MIRALAX / GLYCOLAX) 17 g packet Take 17 g by mouth every other day.     . ranolazine (RANEXA) 500 MG 12 hr tablet Take 1 tablet (500 mg total) by mouth 2 (two) times daily. 60 tablet 0  . simvastatin (ZOCOR) 40 MG tablet TAKE ONE TABLET BY MOUTH EVERY DAY (Patient taking differently: Take 40 mg by mouth at bedtime. ) 90 tablet 3  . tolterodine (DETROL [name]) 4 MG 24 hr capsule Take 4 mg by mouth daily.     No current facility-administered medications for this visit.     Allergies:   Vioxx [rofecoxib], Contrast media [iodinated diagnostic agents], and Morphine   Social History:  The patient  reports that he quit smoking about 31 years ago. His smoking use included cigarettes. He has a 25.00 pack-year smoking history. He has never used smokeless tobacco. He reports that he does not drink alcohol or use drugs.   Family History:   family history includes Cancer in his sister and another family member; Diabetes in his brother and mother; Heart attack in his mother; Heart disease in his father; Kidney disease in his father and sister; Stroke in his father and mother.   Review of Systems: Review of Systems  Constitutional: Negative.   HENT: Negative.   Respiratory: Negative.   Cardiovascular: Negative.   Gastrointestinal: Negative.   Musculoskeletal: Negative.   Neurological: Negative.   Psychiatric/Behavioral: Negative.   All other systems reviewed and are negative.   PHYSICAL EXAM: VS:  BP 134/74 (BP Location: Left Arm, Patient Position: Sitting, Cuff Size: Normal)   Pulse 67   Ht 6' (1.829 m)   Wt 211 lb 12 oz (96  kg)   SpO2 95%   BMI 28.72 kg/m  , BMI Body mass index is 28.72 kg/m. Constitutional:  oriented to person, place, and time. No distress.  HENT:  Head: Grossly normal Eyes:  no discharge. No scleral icterus.  Neck: No JVD, no carotid bruits  Cardiovascular: Regular rate and rhythm, no murmurs appreciated Pulmonary/Chest: Clear to auscultation bilaterally, no wheezes or rails Abdominal: Soft.  no distension.  no tenderness.  Musculoskeletal: Normal range of motion Neurological:  normal muscle tone. Coordination normal. No atrophy Skin: Skin warm and dry Psychiatric: normal affect, pleasant  Recent Labs: 06/19/2019: B Natriuretic Peptide 275.7 06/24/2019: Magnesium 2.3 06/25/2019: Hemoglobin 11.1; Platelets 293 09/10/2019: ALT 15; BUN 29; Creatinine, Ser 1.44; Potassium 4.7; Sodium 139    Lipid Panel [name] Results  Component Value Date   CHOL 109 06/04/2019   HDL 33 (L) 06/04/2019   LDLCALC 62 06/04/2019   TRIG 78 06/18/2019  Wt Readings from Last 3 Encounters:  10/22/19 211 lb 12 oz (96 kg)  09/10/19 203 lb (92.1 kg)  08/06/19 207 lb 4 oz (94 kg)      ASSESSMENT AND PLAN: Cardiomyopathy, post covid Recovered, EF up to 50 -55% Breathing better Stay on same medication  Covid , recovering Recovered from infection 05/2019 November 2020 Feels better  Mixed hyperlipidemia Cholesterol is at goal on the current lipid regimen. No changes to the medications were made.  Essential hypertension Blood pressure is well controlled on today's visit. No changes made to the medications.  Atherosclerosis of coronary artery bypass graft of native heart with stable angina pectoris ([place]) Currently with no symptoms of angina. No further workup at this time. Continue current medication regimen.  Bilateral carotid artery stenosis Mild bilateral carotid disease less than 39% bilaterally Continue aggressive lipid management  Diet-controlled diabetes mellitus (HCC) Weight  stable Higher numbers with prednisone  Ex-smoker Nonsmoker  PAD Follows with Dr. [name] ,  bilateral SFA disease stable   Disposition:   F/U  6 month   Total encounter time more than 25 minutes  Greater than 50% was spent in counseling and coordination of care with the patient    Orders Placed This Encounter  Procedures  . EKG 12-Lead     Signed, Esmond Plants, M.D., Ph.D. 10/22/2019  [place], [place]

## 2019-10-22 ENCOUNTER — Other Ambulatory Visit: Payer: Self-pay

## 2019-10-22 ENCOUNTER — Other Ambulatory Visit: Payer: PPO

## 2019-10-22 ENCOUNTER — Encounter: Payer: Self-pay | Admitting: Cardiovascular Disease

## 2019-10-22 ENCOUNTER — Ambulatory Visit: Payer: PPO | Admitting: Cardiovascular Disease

## 2019-10-22 VITALS — BP 134/74 | HR 67 | Ht 72.0 in | Wt 211.8 lb

## 2019-10-22 DIAGNOSIS — I25118 Atherosclerotic heart disease of native coronary artery with other forms of angina pectoris: Secondary | ICD-10-CM

## 2019-10-22 DIAGNOSIS — R0602 Shortness of breath: Secondary | ICD-10-CM

## 2019-10-22 DIAGNOSIS — E785 Hyperlipidemia, unspecified: Secondary | ICD-10-CM | POA: Diagnosis not present

## 2019-10-22 DIAGNOSIS — I739 Peripheral vascular disease, unspecified: Secondary | ICD-10-CM | POA: Diagnosis not present

## 2019-10-22 DIAGNOSIS — I6523 Occlusion and stenosis of bilateral carotid arteries: Secondary | ICD-10-CM

## 2019-10-22 DIAGNOSIS — I10 Essential (primary) hypertension: Secondary | ICD-10-CM

## 2019-10-22 MED ORDER — FUROSEMIDE 20 MG PO TABS: 20.0000 mg | ORAL_TABLET | Freq: Every day | ORAL | 3 refills | Status: DC | PRN

## 2019-10-22 MED ORDER — SIMVASTATIN 40 MG PO TABS: 40.0000 mg | ORAL_TABLET | Freq: Every day | ORAL | 3 refills | Status: DC

## 2019-10-22 MED ORDER — EZETIMIBE 10 MG PO TABS: 10.0000 mg | ORAL_TABLET | Freq: Every day | ORAL | 3 refills | Status: DC

## 2019-10-22 NOTE — Patient Instructions (Addendum)
Medication Instructions:  Decrease the lasix down to 2x to 3x a week   If you need a refill on your cardiac medications before your next appointment, please call your pharmacy.    Lab work: No new labs needed   If you have labs (blood work) drawn today and your tests are completely normal, you will receive your results only by: Marland Kitchen MyChart Message (if you have MyChart) OR . A paper copy in the mail If you have any lab test that is abnormal or we need to change your treatment, we will call you to review the results.   Testing/Procedures: No new testing needed   Follow-Up: At [facility], you and your health needs are our priority.  As part of our continuing mission to provide you with exceptional heart care, we have created designated Provider Care Teams.  These Care Teams include your primary Cardiologist (physician) and Advanced Practice Providers (APPs -  Physician Assistants and Nurse Practitioners) who all work together to provide you with the care you need, when you need it.  . You will need a follow up appointment in 6 months .     Marland Kitchen Providers on your designated Care Team:   . Murray Hodgkins, NP . Christell Faith, PA-C . Marrianne Mood, PA-C  Any Other Special Instructions Will Be Listed Below (If Applicable).  For educational health videos Log in to : [email or web address] Or : [email or web address], password : [email or web address]

## 2019-11-04 ENCOUNTER — Ambulatory Visit: Payer: PPO | Admitting: Cardiovascular Disease

## 2019-11-06 ENCOUNTER — Other Ambulatory Visit: Payer: Self-pay

## 2019-11-06 ENCOUNTER — Ambulatory Visit: Payer: PPO | Admitting: Dermatology

## 2019-11-06 DIAGNOSIS — D229 Melanocytic nevi, unspecified: Secondary | ICD-10-CM | POA: Diagnosis not present

## 2019-11-06 DIAGNOSIS — Z1283 Encounter for screening for malignant neoplasm of skin: Secondary | ICD-10-CM

## 2019-11-06 DIAGNOSIS — L814 Other melanin hyperpigmentation: Secondary | ICD-10-CM

## 2019-11-06 DIAGNOSIS — D2230 Melanocytic nevi of unspecified part of face: Secondary | ICD-10-CM

## 2019-11-06 DIAGNOSIS — D225 Melanocytic nevi of trunk: Secondary | ICD-10-CM

## 2019-11-06 DIAGNOSIS — D239 Other benign neoplasm of skin, unspecified: Secondary | ICD-10-CM

## 2019-11-06 DIAGNOSIS — D1800 Hemangioma unspecified site: Secondary | ICD-10-CM

## 2019-11-06 DIAGNOSIS — L821 Other seborrheic keratosis: Secondary | ICD-10-CM | POA: Diagnosis not present

## 2019-11-06 DIAGNOSIS — L570 Actinic keratosis: Secondary | ICD-10-CM

## 2019-11-06 DIAGNOSIS — L578 Other skin changes due to chronic exposure to nonionizing radiation: Secondary | ICD-10-CM | POA: Diagnosis not present

## 2019-11-06 NOTE — Progress Notes (Signed)
   Follow-Up Visit   Subjective  [name] is a 79 y.o. male who presents for the following: Annual Exam (patient has a history of AK's. ).  The following portions of the chart were reviewed this encounter and updated as appropriate:     Review of Systems: No other skin or systemic complaints.  Objective  Well appearing patient in no apparent distress; mood and affect are within normal limits.  A full examination was performed including scalp, head, eyes, ears, nose, lips, neck, chest, axillae, abdomen, back, buttocks, bilateral upper extremities, bilateral lower extremities, hands, feet, fingers, toes, fingernails, and toenails. All findings within normal limits unless otherwise noted below.  Objective  Face, trunk, extremities: Diffuse scaly erythematous macules with underlying dyspigmentation.   Objective  L tricep, R post shoulder, Right Upper Back, Right knee: Firm pink/brown papulenodule with dimple sign.   Objective  Trunk, extremities: Red papules.   Objective  Face, trunk, extremities: Scattered tan macules.   Objective  Face, trunk, extremities: Tan-brown and/or pink-flesh-colored symmetric macules and papules.   Objective  Face, trunk, extremities: Stuck-on, waxy, tan-brown papule or plaque --Discussed benign etiology and prognosis.   Assessment & Plan  AK (actinic keratosis) Head - Anterior (Face) Resolved and clear today. Actinic skin damage Face, trunk, extremities  Recommend daily broad spectrum sunscreen SPF 30+ to sun-exposed areas, reapply every 2 hours as needed. Call for new or changing lesions.   Hemangioma, unspecified site Trunk, extremities  Benign, observe.    Lentigines Face, trunk, extremities  Benign, observe.    Nevus Face, trunk, extremities  Benign, observe.    Seborrheic keratosis Face, trunk, extremities  Benign, observe.    Return in about 1 year (around 11/05/2020) for TBSE.   [name] Port, MD, am  acting as scribe for [name] Ser, MD .

## 2019-11-07 DIAGNOSIS — M533 Sacrococcygeal disorders, not elsewhere classified: Secondary | ICD-10-CM | POA: Diagnosis not present

## 2019-11-11 ENCOUNTER — Ambulatory Visit: Payer: PPO | Admitting: Podiatry

## 2019-11-19 DIAGNOSIS — M545 Low back pain: Secondary | ICD-10-CM | POA: Diagnosis not present

## 2019-11-20 ENCOUNTER — Encounter: Payer: Self-pay | Admitting: Podiatry

## 2019-11-20 ENCOUNTER — Ambulatory Visit: Payer: PPO | Admitting: Podiatry

## 2019-11-20 ENCOUNTER — Other Ambulatory Visit: Payer: Self-pay

## 2019-11-20 DIAGNOSIS — I739 Peripheral vascular disease, unspecified: Secondary | ICD-10-CM

## 2019-11-20 NOTE — Progress Notes (Signed)
He presents today after having not seen him for 3 years for checkup of his feet states that he had Covid and was in the hospital for 20 days and has had some swelling to his feet he has a past medical history that is associated with peripheral vascular disease and has had stenosis of his SFA bilaterally and he sees Dr. [name] for that.  He also has seen Dr.Jenkins for back surgery which has left him with neuropathy and paresthesias in his left leg and left foot to some degree right foot similarly.  States that he takes 300 mg of gabapentin 3 times a day for the pain in his legs.  He states that his pains in the leg seems to be getting a little bit worse recently just wanted me to take a look at his legs and feet.  Objective: Vital signs are stable he is alert and oriented x3 pulses are nonpalpable bilateral capillary fill time is immediate he does have venous distention with no swelling and no pitting edema of the ankles or the feet.  He has good muscle tone his dorsiflexors plantar flexors inverters and everters are all intact and his feet are in rectus position with normal stance.  Cutaneous evaluation demonstrates supple well-hydrated cutis no erythema edema cellulitis drainage or odor no open lesions or wounds.  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  Plan: Encouraged him to follow-up with Dr. Velva Harman and Dr. [name].  I recommended he continue his gabapentin at the current level.  Follow-up with him as needed

## 2019-11-26 ENCOUNTER — Other Ambulatory Visit: Payer: Self-pay | Admitting: Cardiovascular Disease

## 2019-11-26 DIAGNOSIS — M533 Sacrococcygeal disorders, not elsewhere classified: Secondary | ICD-10-CM | POA: Diagnosis not present

## 2019-11-28 ENCOUNTER — Ambulatory Visit: Payer: PPO | Admitting: Cardiovascular Disease

## 2019-11-28 ENCOUNTER — Other Ambulatory Visit: Payer: Self-pay

## 2019-11-28 ENCOUNTER — Encounter: Payer: Self-pay | Admitting: Cardiovascular Disease

## 2019-11-28 VITALS — BP 124/58 | HR 74 | Ht 72.0 in | Wt 210.4 lb

## 2019-11-28 DIAGNOSIS — I739 Peripheral vascular disease, unspecified: Secondary | ICD-10-CM | POA: Diagnosis not present

## 2019-11-28 DIAGNOSIS — I25810 Atherosclerosis of coronary artery bypass graft(s) without angina pectoris: Secondary | ICD-10-CM | POA: Diagnosis not present

## 2019-11-28 NOTE — Patient Instructions (Signed)
Medication Instructions:  Your physician recommends that you continue on your current medications as directed. Please refer to the Current Medication list given to you today.  *If you need a refill on your cardiac medications before your next appointment, please call your pharmacy*   Lab Work: None ordered If you have labs (blood work) drawn today and your tests are completely normal, you will receive your results only by: . MyChart Message (if you have MyChart) OR . A paper copy in the mail If you have any lab test that is abnormal or we need to change your treatment, we will call you to review the results.   Testing/Procedures: None ordered   Follow-Up: At [facility], you and your health needs are our priority.  As part of our continuing mission to provide you with exceptional heart care, we have created designated Provider Care Teams.  These Care Teams include your primary Cardiologist (physician) and Advanced Practice Providers (APPs -  Physician Assistants and Nurse Practitioners) who all work together to provide you with the care you need, when you need it.  We recommend signing up for the patient portal called "MyChart".  Sign up information is provided on this After Visit Summary.  MyChart is used to connect with patients for Virtual Visits (Telemedicine).  Patients are able to view lab/test results, encounter notes, upcoming appointments, etc.  Non-urgent messages can be sent to your provider as well.   To learn more about what you can do with MyChart, go to https://www.mychart.com.    Your next appointment:   6 month(s)  The format for your next appointment:   In Person  Provider:    You may see Dr. Arida or one of the following Advanced Practice Providers on your designated Care Team:    Christopher Berge, NP  Ryan Dunn, PA-C  Jacquelyn Visser, PA-C    Other Instructions [name]  

## 2019-11-28 NOTE — Progress Notes (Signed)
Cardiology Office Note   Date:  11/28/2019   ID:  [name], DOB September 18, 1940, MRN [id number]  PCP:  Ria Bush, MD  Cardiologist:  Dr. Rockey Situ   Chief Complaint  Patient presents with  . other    6 mth f/u      History of Present Illness: [name] is a 79 y.o. male who is here today for a follow-up visit regarding peripheral arterial disease. The patient has known history of coronary artery disease status post CABG in 1990, PCI of the left circumflex in 2002, DVT in the left lower extremity, hyperlipidemia and back surgery for spinal stenosis in 2017.  He had hip replacement x 2. 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  He was seen in early 2019 for peripheral arterial disease. Lower extremity arterial Doppler showed noncompressible arteries on the right side with normal toe brachial index and biphasic waveform.  Left ABI was normal with biphasic waveforms. Lower extremity arterial duplex showed evidence of bilateral SFA disease. His symptoms were felt to be multifactorial due to PAD, arthritis and severe peripheral neuropathy.  He had worsening bilateral leg pain in 2020.  Angiography in January of last year  which showed no significant aortoiliac disease.  On the right side, there was moderate to severely calcified SFA with diffuse disease especially in the midsegment and three-vessel runoff below the knee.  On the left, there was borderline significant SFA disease which was also calcified with three-vessel runoff below the knee.  I felt that his symptoms were out of proportion to his angiogram findings. He does have lower back issues and follows with Dr. [name].  In addition, he has significant neuropathy and takes gabapentin.  He was hospitalized in October with Covid and was there for almost 20 years with multiple complications.  His echo showed a drop in ejection fraction but most recent  echo showed an EF of 50%. He continues to have lots of aches and pains including his back, legs and feet.  No lower extremity ulceration.    Past Medical History:  Diagnosis Date  . Actinic keratosis   . BENIGN PROSTATIC HYPERTROPHY, WITH URINARY OBSTRUCTION 09/06/2007  . CAD s/p CABG    a. 1990 s/p MI-->CABG x 2; b. 2002 s/p BMS to LCX; c. 06/2015 Cath: LM 50ost, LAD 100ost/p, 74m/d, D2 nl, LCX  patent stent, RCA 80p (small), LIMA->LAD nl, VG->D2 nl-->Med Rx.  . Carotid arterial disease ([place])    a. 08/2016 Carotid U/S: <39% bilat.  . 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 Community acquired pneumonia of right lower lobe of lung 06/05/2017  . COVID-19 virus infection 05/30/2019   Covid PNA with hospitalization 05/2019  . 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 DVT, HX OF 1998  . GERD 04/30/2007  . Headache    hx migraines  . History of hiatal hernia   . History of shingles   . HYPERLIPIDEMIA 04/26/2007  . HYPERTENSION 04/30/2007  . INGUINAL HERNIA, RIGHT 05/26/2010  . Laceration of skin of left hand 03/08/2018  . Lumbar disc disease with radiculopathy   . Myocardial infarction ([place])    1989, 2002  . OSA (obstructive sleep apnea)    a. did not tolerate CPAP.  Marland Kitchen Osteoarthritis   . PAD (peripheral artery disease) ([place])    a. 07/2017 LE duplex: RSFA 75-32m, LSFA 75-47m, 50-74d.  . Pneumonia 07/2014   [facility] hospitalization  . Pneumonia due to  COVID-19 virus 06/02/2019  . Pre-diabetes   . PSA, INCREASED 07/09/2008  . Spinal stenosis of lumbar region     Past Surgical History:  Procedure Laterality Date  . ABDOMINAL AORTOGRAM W/LOWER EXTREMITY [name] 09/12/2018   Procedure: ABDOMINAL AORTOGRAM W/LOWER EXTREMITY;  Surgeon: [name], MD;  Location: [place] [facility];  Service: Cardiovascular;  Laterality: [name];  . APPENDECTOMY     rupture  . CARDIAC CATHETERIZATION [name] 07/07/2015   Procedure: Left Heart Cath and Cors/Grafts Angiography;  Surgeon: [name], MD;  Location:  [place] [facility];  Service: Cardiovascular;  Laterality: [name];  . CATARACT EXTRACTION, BILATERAL    . CERVICAL SPINE SURGERY  12/2016   cervical stenosis [name])  . COLONOSCOPY  03/2010   HP polyp, diverticulosis, rpt 10 yrs ([name])  . CORONARY ARTERY BYPASS GRAFT  1990   3 vessel   . CYSTOSCOPY  12/23/10   [facility]ope  . EYE SURGERY     cataract  . KNEE ARTHROSCOPY Right    x2  . LAMINOTOMY  1986   L5/S1 lumbar laminotomy for two ruptured discs/fusion  . LUMBAR LAMINECTOMY/DECOMPRESSION MICRODISCECTOMY [name] 07/13/2016   Procedure: LUMBAR TWO-THREE, LUMBAR THREE-FOUR, LUMBAR FOUR-FIVE LAMINECTOMY AND FORAMINOTOMY;  Surgeon: [name], MD;  Location: [facility];  Service: Neurosurgery;  Laterality: [name];  LAMINECTOMY AND FORAMINOTOMY L2-L3, L3-L4,L4-L5  . TOTAL HIP ARTHROPLASTY Bilateral 1999,2000  . TOTAL KNEE ARTHROPLASTY Right 03/06/2017   Procedure: RIGHT TOTAL KNEE ARTHROPLASTY;  Surgeon: Gaynelle Arabian, MD;  Location: [facility];  Service: Orthopedics;  Laterality: Right;     Current Outpatient Medications  Medication Sig Dispense Refill  . acetaminophen (TYLENOL) 650 MG CR tablet Take 1,300 mg by mouth every 8 (eight) hours as needed for pain.    Marland Kitchen albuterol (VENTOLIN HFA) 108 (90 Base) MCG/ACT inhaler Inhale 2 puffs into the lungs every 6 (six) hours as needed for wheezing or shortness of breath. 18 g 3  . aspirin EC 81 MG EC tablet Take 1 tablet (81 mg total) by mouth daily. 30 tablet 0  . Cyanocobalamin (B-12) 1000 MCG SUBL Place 1 tablet under the tongue daily. 30 each   . ezetimibe (ZETIA) 10 MG tablet Take 1 tablet (10 mg total) by mouth daily. 90 tablet 3  . furosemide (LASIX) 20 MG tablet Take 1 tablet (20 mg total) by mouth daily as needed. 30 tablet 3  . gabapentin (NEURONTIN) 300 MG capsule Take 300 mg by mouth 3 (three) times daily.     . isosorbide mononitrate (IMDUR) 30 MG 24 hr tablet Take 1 tablet (30 mg total) by mouth at bedtime. 90 tablet 3  . ketoconazole (NIZORAL) 2  % cream Apply 1 application topically daily as needed for irritation.     Marland Kitchen losartan (COZAAR) 25 MG tablet Take 1 tablet (25 mg total) by mouth daily. 90 tablet 3  . metoprolol succinate (TOPROL XL) 25 MG 24 hr tablet Take 0.5 tablets (12.5 mg total) by mouth daily. 45 tablet 3  . nitroGLYCERIN (NITROSTAT) 0.4 MG SL tablet Place 1 tablet (0.4 mg total) under the tongue every 5 (five) minutes as needed for chest pain. 25 tablet 3  . polyethylene glycol (MIRALAX / GLYCOLAX) 17 g packet Take 17 g by mouth every other day.     . ranolazine (RANEXA) 500 MG 12 hr tablet TAKE ONE TABLET TWICE DAILY 28 tablet 0  . simvastatin (ZOCOR) 40 MG tablet Take 1 tablet (40 mg total) by mouth daily. 90 tablet 3  .  tolterodine (DETROL LA) 4 MG 24 hr capsule Take 4 mg by mouth daily.     No current facility-administered medications for this visit.    Allergies:   Vioxx [rofecoxib], Contrast media [iodinated diagnostic agents], and Morphine    Social History:  The patient  reports that he quit smoking about 31 years ago. His smoking use included cigarettes. He has a 25.00 pack-year smoking history. He has never used smokeless tobacco. He reports that he does not drink alcohol or use drugs.   Family History:  The patient's family history includes Cancer in his sister and another family member; Diabetes in his brother and mother; Heart attack in his mother; Heart disease in his father; Kidney disease in his father and sister; Stroke in his father and mother.    ROS:  Please see the history of present illness.   Otherwise, review of systems are positive for none.   All other systems are reviewed and negative.    PHYSICAL EXAM: VS:  BP (!) 124/58   Pulse 74   Ht 6' (1.829 m)   Wt 210 lb 6.4 oz (95.4 kg)   SpO2 98%   BMI 28.54 kg/m  , BMI Body mass index is 28.54 kg/m. GEN: Well nourished, well developed, in no acute distress  HEENT: normal  Neck: no JVD, carotid bruits, or masses Cardiac: RRR; no  rubs, or  gallops,no edema .  1/ 6 systolic ejection murmur in the aortic area Respiratory:  clear to auscultation bilaterally, normal work of breathing GI: soft, nontender, nondistended, + BS MS: no deformity or atrophy  Skin: warm and dry, no rash Neuro:  Strength and sensation are intact Psych: euthymic mood, full affect Vascular: Femoral pulses are normal.  Distal pulses are nonpalpable   EKG:  EKG is  ordered today. EKG showed normal sinus rhythm with left anterior fascicular block.  PVCs.  Recent Labs: 06/19/2019: B Natriuretic Peptide 275.7 06/24/2019: Magnesium 2.3 06/25/2019: Hemoglobin 11.1; Platelets 293 09/10/2019: ALT 15; BUN 29; Creatinine, Ser 1.44; Potassium 4.7; Sodium 139    Lipid Panel    Component Value Date/Time   CHOL 109 06/04/2019 0540   TRIG 78 06/18/2019 2150   HDL 33 (L) 06/04/2019 0540   CHOLHDL 3.3 06/04/2019 0540   VLDL 14 06/04/2019 0540   LDLCALC 62 06/04/2019 0540   LDLDIRECT 106.0 01/01/2016 0844      Wt Readings from Last 3 Encounters:  11/28/19 210 lb 6.4 oz (95.4 kg)  10/22/19 211 lb 12 oz (96 kg)  09/10/19 203 lb (92.1 kg)      No flowsheet data found.    ASSESSMENT AND PLAN:  1.  Peripheral arterial disease: The patient does have bilateral calcified SFA disease worse on the right side.  However, his symptoms are worse on the left side and he has a lot of other associated symptoms suggestive of pseudoclaudication and peripheral neuropathy.  I again reviewed the angiogram with him and both SFAs can be treated percutaneously but the expected outcome is improvement in his calf and feet pain and not the pain related to his back or neuropathy.  His symptoms are clearly multifactorial.  The patient prefers to avoid procedures as much as possible.  Continue medical therapy for now and follow-up with me in 6 months.  2.  Coronary artery disease involving native coronary arteries without angina: Continue medical therapy.  3.  Hyperlipidemia: He is  tolerating simvastatin and Zetia.  4.  Essential hypertension: Blood pressure is controlled.  Disposition:   FU with me in 6 months   Signed, Kathlyn Sacramento, MD 11/28/19 [place], [place]

## 2019-11-29 ENCOUNTER — Other Ambulatory Visit: Payer: Self-pay | Admitting: Cardiovascular Disease

## 2019-12-05 ENCOUNTER — Telehealth (INDEPENDENT_AMBULATORY_CARE_PROVIDER_SITE_OTHER): Payer: PPO | Admitting: Family Medicine

## 2019-12-05 ENCOUNTER — Encounter: Payer: Self-pay | Admitting: Family Medicine

## 2019-12-05 DIAGNOSIS — S70362A Insect bite (nonvenomous), left thigh, initial encounter: Secondary | ICD-10-CM | POA: Diagnosis not present

## 2019-12-05 DIAGNOSIS — J069 Acute upper respiratory infection, unspecified: Secondary | ICD-10-CM

## 2019-12-05 DIAGNOSIS — W57XXXA Bitten or stung by nonvenomous insect and other nonvenomous arthropods, initial encounter: Secondary | ICD-10-CM | POA: Diagnosis not present

## 2019-12-05 MED ORDER — DOXYCYCLINE HYCLATE 100 MG PO TABS: 100.0000 mg | ORAL_TABLET | Freq: Two times a day (BID) | ORAL | 0 refills | Status: DC

## 2019-12-05 NOTE — Patient Instructions (Addendum)
Take the doxycycline as directed -this covers tick bourne illness as well as some respiratory illness  Continue your antihistamine if needed  Also an expectorant like mucinex or robitussin as needed  If symptoms worsen please get tested for covid and contact us

## 2019-12-05 NOTE — Progress Notes (Signed)
Virtual Visit via Video Note  I connected with [name] on 12/05/19 at 11:30 AM EDT by a video enabled telemedicine application and verified that I am speaking with the correct person using two identifiers.  Location: Patient: home Provider: office   I discussed the limitations of evaluation and management by telemedicine and the availability of in person appointments. The patient expressed understanding and agreed to proceed.  Parties involved in encounter  Patient: [name]  Provider:  Loura Pardon MD     History of Present Illness: Pt presents with uri symptoms and tick bite  79 yo pt of Dr Darnell Level  Has had his covid vaccine series in jan/feb Has also had covid   Pulse ox on RA is 97% today   Started with a little st 2 d ago  Hit him late yesterday afternoon , ST then burning in chest  Little cough (not bad) - phlegm  Some white material in throat  A little nasal congestion  Hoarse voice Slight headache - that is better right this moment   No fever  No pain in face /or pressure  No body aches  No chills or sweats   No wheeze or sob   No rash   No loss of taste or smell   He had a tick on him today - deer tick / was attached  Left a red spot  Was on back of thigh on L left   (he does work in the yard and woods)  Has never had tick fever    Wife has allergy symptoms and coughs  Low grade temp in her   Does not go out in crowds    Otc: allergy pill daily - claritin  No nasal sprays   Has inhaler he uses for lungs (ventolin)  - has had issues with breathing since having covid   Has had pneumonia in the past   Patient Active Problem List   Diagnosis Date Noted  . Tick bite of left thigh 12/05/2019  . URI (upper respiratory infection) 12/05/2019  . Coccydynia 09/20/2019  . Sinus congestion 07/19/2019  . Lump in the abdomen 07/08/2019  . 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 Anxiety 06/04/2019  . Hand swelling  12/07/2018  . Chronic right shoulder pain 09/10/2018  . Fall 03/08/2018  . Cervical stenosis of spinal canal 06/05/2017  . Pedal edema 06/05/2017  . Health maintenance examination 12/06/2016  . 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 Overweight (BMI 25.0-29.9) 07/04/2016  . Low vitamin B12 level 01/01/2016  . Medicare annual wellness visit, subsequent 07/03/2015  . Advanced care planning/counseling discussion 07/03/2015  . Controlled diabetes mellitus type 2 with complications ([place]) [id number]  . Chest pain 05/04/2015  . Ex-smoker 05/04/2015  . 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 Spermatocele 04/02/2013  . Syncopal vertigo 11/04/2010  . Lumbar disc disease with radiculopathy 11/04/2010  . Inguinal hernia 05/26/2010  . 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 PVD (peripheral vascular disease) with claudication ([place]) 08/11/2009  . 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 Carotid stenosis 07/09/2008  . 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 Essential hypertension 04/30/2007  . GERD 04/30/2007  . Hyperlipidemia 04/26/2007  . OA (osteoarthritis) of knee 04/26/2007  . DVT, HX OF 04/26/2007   Past Medical History:  Diagnosis Date  . Actinic keratosis   . BENIGN PROSTATIC HYPERTROPHY, WITH URINARY OBSTRUCTION 09/06/2007  . CAD s/p CABG  a. 1990 s/p MI-->CABG x 2; b. 2002 s/p BMS to LCX; c. 06/2015 Cath: LM 50ost, LAD 100ost/p, 1m/d, D2 nl, LCX  patent stent, RCA 80p (small), LIMA->LAD nl, VG->D2 nl-->Med Rx.  . Carotid arterial disease ([place])    a. 08/2016 Carotid U/S: <39% bilat.  . 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 Community acquired pneumonia of right lower lobe of lung 06/05/2017  . COVID-19 virus infection 05/30/2019   Covid PNA with hospitalization 05/2019  . 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 DVT, HX OF 1998  . GERD 04/30/2007  . Headache    hx migraines  . History of  hiatal hernia   . History of shingles   . HYPERLIPIDEMIA 04/26/2007  . HYPERTENSION 04/30/2007  . INGUINAL HERNIA, RIGHT 05/26/2010  . Laceration of skin of left hand 03/08/2018  . Lumbar disc disease with radiculopathy   . Myocardial infarction ([place])    1989, 2002  . OSA (obstructive sleep apnea)    a. did not tolerate CPAP.  Marland Kitchen Osteoarthritis   . PAD (peripheral artery disease) ([place])    a. 07/2017 LE duplex: RSFA 75-100m, LSFA 75-48m, 50-74d.  . Pneumonia 07/2014   [facility] hospitalization  . Pneumonia due to COVID-19 virus 06/02/2019  . Pre-diabetes   . PSA, INCREASED 07/09/2008  . Spinal stenosis of lumbar region    Past Surgical History:  Procedure Laterality Date  . ABDOMINAL AORTOGRAM W/LOWER EXTREMITY [name] 09/12/2018   Procedure: ABDOMINAL AORTOGRAM W/LOWER EXTREMITY;  Surgeon: [name], MD;  Location: [place] [facility];  Service: Cardiovascular;  Laterality: [name];  . APPENDECTOMY     rupture  . CARDIAC CATHETERIZATION [name] 07/07/2015   Procedure: Left Heart Cath and Cors/Grafts Angiography;  Surgeon: [name], MD;  Location: [facility];  Service: Cardiovascular;  Laterality: [name];  . CATARACT EXTRACTION, BILATERAL    . CERVICAL SPINE SURGERY  12/2016   cervical stenosis [name])  . COLONOSCOPY  03/2010   HP polyp, diverticulosis, rpt 10 yrs ([name])  . CORONARY ARTERY BYPASS GRAFT  1990   3 vessel   . CYSTOSCOPY  12/23/10   [facility]  . EYE SURGERY     cataract  . KNEE ARTHROSCOPY Right    x2  . LAMINOTOMY  1986   L5/S1 lumbar laminotomy for two ruptured discs/fusion  . LUMBAR LAMINECTOMY/DECOMPRESSION MICRODISCECTOMY [name] 07/13/2016   Procedure: LUMBAR TWO-THREE, LUMBAR THREE-FOUR, LUMBAR FOUR-FIVE LAMINECTOMY AND FORAMINOTOMY;  Surgeon: [name], MD;  Location: [facility];  Service: Neurosurgery;  Laterality: [name];  LAMINECTOMY AND FORAMINOTOMY L2-L3, L3-L4,L4-L5  . TOTAL HIP ARTHROPLASTY Bilateral 1999,2000  . TOTAL KNEE ARTHROPLASTY Right 03/06/2017    Procedure: RIGHT TOTAL KNEE ARTHROPLASTY;  Surgeon: Gaynelle Arabian, MD;  Location[facility];  Service: Orthopedics;  Laterality: Right;   Social History   Tobacco Use  . Smoking status: Former Smoker    Packs/day: 1.00    Years: 25.00    Pack years: 25.00    Types: Cigarettes    Quit date: 08/15/1988    Years since quitting: 31.3  . Smokeless tobacco: Never Used  Substance Use Topics  . Alcohol use: No    Alcohol/week: 0.0 standard drinks  . Drug use: No   Family History  Problem Relation Age of Onset  . Stroke Mother   . Heart attack Mother   . Diabetes Mother   . Stroke Father   . Heart disease Father   . Kidney disease Father        PCKD  .  Cancer Sister        throat  . Diabetes Brother   . Kidney disease Sister        PCKD  . Cancer Other        5/7 nephews with lung cancer   Allergies  Allergen Reactions  . Vioxx [Rofecoxib] Other (See Comments)    Hemorrhage   . Contrast Media [Iodinated Diagnostic Agents] Itching and Rash    Delayed reaction post abdominal aortagram.   . Morphine Nausea Only and Other (See Comments)    Irritability    Current Outpatient Medications on File Prior to Visit  Medication Sig Dispense Refill  . acetaminophen (TYLENOL) 650 MG CR tablet Take 1,300 mg by mouth every 8 (eight) hours as needed for pain.    Marland Kitchen albuterol (VENTOLIN HFA) 108 (90 Base) MCG/ACT inhaler Inhale 2 puffs into the lungs every 6 (six) hours as needed for wheezing or shortness of breath. 18 g 3  . aspirin EC 81 MG EC tablet Take 1 tablet (81 mg total) by mouth daily. 30 tablet 0  . Cyanocobalamin (B-12) 1000 MCG SUBL Place 1 tablet under the tongue daily. 30 each   . ezetimibe (ZETIA) 10 MG tablet Take 1 tablet (10 mg total) by mouth daily. 90 tablet 3  . furosemide (LASIX) 20 MG tablet Take 1 tablet (20 mg total) by mouth daily as needed. 30 tablet 3  . gabapentin (NEURONTIN) 300 MG capsule Take 300 mg by mouth 3 (three) times daily.     . isosorbide mononitrate  (IMDUR) 30 MG 24 hr tablet Take 1 tablet (30 mg total) by mouth at bedtime. 90 tablet 3  . ketoconazole (NIZORAL) 2 % cream Apply 1 application topically daily as needed for irritation.     Marland Kitchen losartan (COZAAR) 25 MG tablet Take 1 tablet (25 mg total) by mouth daily. 90 tablet 3  . metoprolol succinate (TOPROL XL) 25 MG 24 hr tablet Take 0.5 tablets (12.5 mg total) by mouth daily. 45 tablet 3  . nitroGLYCERIN (NITROSTAT) 0.4 MG SL tablet Place 1 tablet (0.4 mg total) under the tongue every 5 (five) minutes as needed for chest pain. 25 tablet 3  . polyethylene glycol (MIRALAX / GLYCOLAX) 17 g packet Take 17 g by mouth every other day.     . ranolazine (RANEXA) 500 MG 12 hr tablet TAKE ONE TABLET TWICE DAILY 180 tablet 2  . simvastatin (ZOCOR) 40 MG tablet Take 1 tablet (40 mg total) by mouth daily. 90 tablet 3  . tolterodine (DETROL LA) 4 MG 24 hr capsule Take 4 mg by mouth daily.     No current facility-administered medications on file prior to visit.   Review of Systems  Constitutional: Positive for malaise/fatigue. Negative for chills, diaphoresis and fever.  HENT: Positive for congestion and sore throat. Negative for ear pain and sinus pain.   Eyes: Negative for blurred vision, discharge and redness.  Respiratory: Positive for cough. Negative for shortness of breath and stridor.   Cardiovascular: Negative for chest pain, palpitations and leg swelling.  Gastrointestinal: Negative for abdominal pain, diarrhea, nausea and vomiting.  Musculoskeletal: Negative for myalgias.  Skin: Negative for rash.  Neurological: Positive for headaches. Negative for dizziness.    Observations/Objective: Patient appears well, in no distress Weight is baseline  No facial swelling or asymmetry Hoarse voice  No obvious tremor or mobility impairment Moving neck and UEs normally Able to hear the call well  No cough or shortness of breath during interview (  pt does clear his throat) Talkative and mentally sharp  with no cognitive changes No skin changes on face or neck , no rash or pallor Affect is normal  Or note: in the background can hear his wife coughing    Assessment and Plan: Problem List Items Addressed This Visit      Respiratory   URI (upper respiratory infection) - Primary    In the setting of a recent tick bite (with sore throat and a little cough, headache resolved)  Pt has had covid and also been vaccinated (low likelyhood)  Wife is fighting a cold  Covered with doxycycline in light of tick bite  He will watch for worse resp symptoms,  Continue claritin and try salt water gargles  otc guaifen prn cough  Has albuterol mdi if needed  If worse-plan on covid testing  inst to update if worse/new symptoms or not improving in a week        Musculoskeletal and Integument   Tick bite of left thigh    Pt removed tick today and suspects was att for over 2 days  Small red mark (cannot see on camera) and no rash  Some uri symptoms (ST, had a headache, burning feeling in chest with cough) Will cover with doxycycline and update          Follow Up Instructions: Take the doxycycline as directed -this covers tick bourne illness as well as some respiratory illness  Continue your antihistamine if needed  Also an expectorant like mucinex or robitussin as needed  If symptoms worsen please get tested for covid and contact us    I discussed the assessment and treatment plan with the patient. The patient was provided an opportunity to ask questions and all were answered. The patient agreed with the plan and demonstrated an understanding of the instructions.   The patient was advised to call back or seek an in-person evaluation if the symptoms worsen or if the condition fails to improve as anticipated.     Loura Pardon, MD

## 2019-12-05 NOTE — Assessment & Plan Note (Signed)
Pt removed tick today and suspects was att for over 2 days  Small red mark (cannot see on camera) and no rash  Some uri symptoms (ST, had a headache, burning feeling in chest with cough) Will cover with doxycycline and update

## 2019-12-05 NOTE — Assessment & Plan Note (Signed)
In the setting of a recent tick bite (with sore throat and a little cough, headache resolved)  Pt has had covid and also been vaccinated (low likelyhood)  Wife is fighting a cold  Covered with doxycycline in light of tick bite  He will watch for worse resp symptoms,  Continue claritin and try salt water gargles  otc guaifen prn cough  Has albuterol mdi if needed  If worse-plan on covid testing  inst to update if worse/new symptoms or not improving in a week

## 2019-12-09 ENCOUNTER — Ambulatory Visit (INDEPENDENT_AMBULATORY_CARE_PROVIDER_SITE_OTHER): Payer: PPO

## 2019-12-09 ENCOUNTER — Other Ambulatory Visit: Payer: PPO

## 2019-12-09 ENCOUNTER — Other Ambulatory Visit: Payer: Self-pay

## 2019-12-09 VITALS — BP 126/64 | HR 50 | Wt 208.0 lb

## 2019-12-09 DIAGNOSIS — Z0000 Encounter for general adult medical examination without abnormal findings: Secondary | ICD-10-CM | POA: Diagnosis not present

## 2019-12-09 NOTE — Patient Instructions (Signed)
Mr. [name] , Thank you for taking time to come for your Medicare Wellness Visit. I appreciate your ongoing commitment to your health goals. Please review the following plan we discussed and let me know if I can assist you in the future.   Screening recommendations/referrals: Colonoscopy: Up to date, completed 03/23/2010 Recommended yearly ophthalmology/optometry visit for glaucoma screening and checkup Recommended yearly dental visit for hygiene and checkup  Vaccinations: Influenza vaccine: Up to date, completed 04/17/2019 Pneumococcal vaccine: Completed series Tdap vaccine: Up to date, completed 02/10/2018 Shingles vaccine: Completed series    Advanced directives: copy in chart  Conditions/risks identified: Diabetes, hypertension, hyperlipidemia  Next appointment: 12/19/2019 @ 11:30 am   Preventive Care 54 Years and Older, Male Preventive care refers to lifestyle choices and visits with your health care provider that can promote health and wellness. What does preventive care include?  A yearly physical exam. This is also called an annual well check.  Dental exams once or twice a year.  Routine eye exams. Ask your health care provider how often you should have your eyes checked.  Personal lifestyle choices, including:  Daily care of your teeth and gums.  Regular physical activity.  Eating a healthy diet.  Avoiding tobacco and drug use.  Limiting alcohol use.  Practicing safe sex.  Taking low doses of aspirin every day.  Taking vitamin and mineral supplements as recommended by your health care provider. What happens during an annual well check? The services and screenings done by your health care provider during your annual well check will depend on your age, overall health, lifestyle risk factors, and family history of disease. Counseling  Your health care provider may ask you questions about your:  Alcohol use.  Tobacco use.  Drug use.  Emotional  well-being.  Home and relationship well-being.  Sexual activity.  Eating habits.  History of falls.  Memory and ability to understand (cognition).  Work and work Statistician. Screening  You may have the following tests or measurements:  Height, weight, and BMI.  Blood pressure.  Lipid and cholesterol levels. These may be checked every 5 years, or more frequently if you are over 69 years old.  Skin check.  Lung cancer screening. You may have this screening every year starting at age 60 if you have a 30-pack-year history of smoking and currently smoke or have quit within the past 15 years.  Fecal occult blood test (FOBT) of the stool. You may have this test every year starting at age 41.  Flexible sigmoidoscopy or colonoscopy. You may have a sigmoidoscopy every 5 years or a colonoscopy every 10 years starting at age 28.  Prostate cancer screening. Recommendations will vary depending on your family history and other risks.  Hepatitis C blood test.  Hepatitis B blood test.  Sexually transmitted disease (STD) testing.  Diabetes screening. This is done by checking your blood sugar (glucose) after you have not eaten for a while (fasting). You may have this done every 1-3 years.  Abdominal aortic aneurysm (AAA) screening. You may need this if you are a current or former smoker.  Osteoporosis. You may be screened starting at age 39 if you are at high risk. Talk with your health care provider about your test results, treatment options, and if necessary, the need for more tests. Vaccines  Your health care provider may recommend certain vaccines, such as:  Influenza vaccine. This is recommended every year.  Tetanus, diphtheria, and acellular pertussis (Tdap, Td) vaccine. You may need a Td  booster every 10 years.  Zoster vaccine. You may need this after age 80.  Pneumococcal 13-valent conjugate (PCV13) vaccine. One dose is recommended after age 18.  Pneumococcal  polysaccharide (PPSV23) vaccine. One dose is recommended after age 88. Talk to your health care provider about which screenings and vaccines you need and how often you need them. This information is not intended to replace advice given to you by your health care provider. Make sure you discuss any questions you have with your health care provider. Document Released: 08/28/2015 Document Revised: 04/20/2016 Document Reviewed: 06/02/2015 Elsevier Interactive Patient Education  2017 [place] Prevention in the Home Falls can cause injuries. They can happen to people of all ages. There are many things you can do to make your home safe and to help prevent falls. What can I do on the outside of my home?  Regularly fix the edges of walkways and driveways and fix any cracks.  Remove anything that might make you trip as you walk through a door, such as a raised step or threshold.  Trim any bushes or trees on the path to your home.  Use bright outdoor lighting.  Clear any walking paths of anything that might make someone trip, such as rocks or tools.  Regularly check to see if handrails are loose or broken. Make sure that both sides of any steps have handrails.  Any raised decks and porches should have guardrails on the edges.  Have any leaves, snow, or ice cleared regularly.  Use sand or salt on walking paths during winter.  Clean up any spills in your garage right away. This includes oil or grease spills. What can I do in the bathroom?  Use night lights.  Install grab bars by the toilet and in the tub and shower. Do not use towel bars as grab bars.  Use non-skid mats or decals in the tub or shower.  If you need to sit down in the shower, use a plastic, non-slip stool.  Keep the floor dry. Clean up any water that spills on the floor as soon as it happens.  Remove soap buildup in the tub or shower regularly.  Attach bath mats securely with double-sided non-slip rug  tape.  Do not have throw rugs and other things on the floor that can make you trip. What can I do in the bedroom?  Use night lights.  Make sure that you have a light by your bed that is easy to reach.  Do not use any sheets or blankets that are too big for your bed. They should not hang down onto the floor.  Have a firm chair that has side arms. You can use this for support while you get dressed.  Do not have throw rugs and other things on the floor that can make you trip. What can I do in the kitchen?  Clean up any spills right away.  Avoid walking on wet floors.  Keep items that you use a lot in easy-to-reach places.  If you need to reach something above you, use a strong step stool that has a grab bar.  Keep electrical cords out of the way.  Do not use floor polish or wax that makes floors slippery. If you must use wax, use non-skid floor wax.  Do not have throw rugs and other things on the floor that can make you trip. What can I do with my stairs?  Do not leave any items on the stairs.  Make sure that there are handrails on both sides of the stairs and use them. Fix handrails that are broken or loose. Make sure that handrails are as long as the stairways.  Check any carpeting to make sure that it is firmly attached to the stairs. Fix any carpet that is loose or worn.  Avoid having throw rugs at the top or bottom of the stairs. If you do have throw rugs, attach them to the floor with carpet tape.  Make sure that you have a light switch at the top of the stairs and the bottom of the stairs. If you do not have them, ask someone to add them for you. What else can I do to help prevent falls?  Wear shoes that:  Do not have high heels.  Have rubber bottoms.  Are comfortable and fit you well.  Are closed at the toe. Do not wear sandals.  If you use a stepladder:  Make sure that it is fully opened. Do not climb a closed stepladder.  Make sure that both sides of the  stepladder are locked into place.  Ask someone to hold it for you, if possible.  Clearly mark and make sure that you can see:  Any grab bars or handrails.  First and last steps.  Where the edge of each step is.  Use tools that help you move around (mobility aids) if they are needed. These include:  Canes.  Walkers.  Scooters.  Crutches.  Turn on the lights when you go into a dark area. Replace any light bulbs as soon as they burn out.  Set up your furniture so you have a clear path. Avoid moving your furniture around.  If any of your floors are uneven, fix them.  If there are any pets around you, be aware of where they are.  Review your medicines with your doctor. Some medicines can make you feel dizzy. This can increase your chance of falling. Ask your doctor what other things that you can do to help prevent falls. This information is not intended to replace advice given to you by your health care provider. Make sure you discuss any questions you have with your health care provider. Document Released: 05/28/2009 Document Revised: 01/07/2016 Document Reviewed: 09/05/2014 Elsevier Interactive Patient Education  2017 [employer].

## 2019-12-09 NOTE — Progress Notes (Signed)
Subjective:   [name] is a 79 y.o. male who presents for Medicare Annual/Subsequent preventive examination.  Review of Systems: [name]    This visit is being conducted through telemedicine via telephone at the nurse health advisor's home address due to the COVID-19 pandemic. This patient has given me verbal consent via doximity to conduct this visit, patient states they are participating from their home address. Patient and myself are on the telephone call. There is no referral for this visit. Some vital signs may be absent or patient reported.    Patient identification: identified by name, DOB, and current address   Cardiac Risk Factors include: advanced age (>80men, >20 women);male gender;diabetes mellitus;hypertension;dyslipidemia     Objective:    Vitals: BP 126/64   Pulse (!) 50   Wt 208 lb (94.3 kg)   SpO2 96%   BMI 28.21 kg/m   Body mass index is 28.21 kg/m.  Advanced Directives 12/09/2019 06/19/2019 06/18/2019 06/02/2019 05/30/2019 05/30/2019 05/28/2019  Does Patient Have a Medical Advance Directive? Yes No No Yes Yes Yes Yes  Type of Paramedic of [place];Living will Healthcare Power of [place] of [place];Living will [place];Living will [place];Living will [place];Living will  Does patient want to make changes to medical advance directive? - - No - Patient declined No - Patient declined No - Patient declined No - Patient declined No - Patient declined  Copy of [place] in Chart? Yes - validated most recent copy scanned in chart (See row information) - No - copy requested, Physician notified No - copy requested No - copy requested No - copy requested No - copy requested  Would patient like information on creating a medical advance directive? - No - Patient declined - - - - -    Tobacco Social History   Tobacco Use   Smoking Status Former Smoker  . Packs/day: 1.00  . Years: 25.00  . Pack years: 25.00  . Types: Cigarettes  . Quit date: 08/15/1988  . Years since quitting: 31.3  Smokeless Tobacco Never Used     Counseling given: Not Answered   Clinical Intake:  Pre-visit preparation completed: Yes  Pain : 0-10 Pain Score: 5  Pain Type: Chronic pain Pain Location: Back Pain Orientation: Lower Pain Descriptors / Indicators: Aching Pain Onset: More than a month ago Pain Frequency: Constant     Nutritional Risks: None Diabetes: Yes CBG done?: No Did pt. bring in CBG monitor from home?: No  How often do you need to have someone help you when you read instructions, pamphlets, or other written materials from your doctor or pharmacy?: 1 - Never What is the last grade level you completed in school?: 12th  Interpreter Needed?: No  Information entered by :: CJohnson,LPN  Past Medical History:  Diagnosis Date  . Actinic keratosis   . BENIGN PROSTATIC HYPERTROPHY, WITH URINARY OBSTRUCTION 09/06/2007  . CAD s/p CABG    a. 1990 s/p MI-->CABG x 2; b. 2002 s/p BMS to LCX; c. 06/2015 Cath: LM 50ost, LAD 100ost/p, 50m/d, D2 nl, LCX  patent stent, RCA 80p (small), LIMA->LAD nl, VG->D2 nl-->Med Rx.  . Carotid arterial disease ([place])    a. 08/2016 Carotid U/S: <39% bilat.  . 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 Community acquired pneumonia of right lower lobe of lung 06/05/2017  . COVID-19 virus infection 05/30/2019   Covid PNA with hospitalization 05/2019  . 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 DVT, HX OF 1998  . GERD 04/30/2007  . Headache    hx migraines  . History of hiatal hernia   . History of shingles   . HYPERLIPIDEMIA 04/26/2007  . HYPERTENSION 04/30/2007  . INGUINAL HERNIA, RIGHT 05/26/2010  . Laceration of skin of left hand 03/08/2018  . Lumbar disc disease with radiculopathy   . Myocardial infarction ([place])    1989, 2002  . OSA (obstructive sleep apnea)    a. did not tolerate CPAP.   Marland Kitchen Osteoarthritis   . PAD (peripheral artery disease) ([place])    a. 07/2017 LE duplex: RSFA 75-50m, LSFA 75-49m, 50-74d.  . Pneumonia 07/2014   [facility] hospitalization  . Pneumonia due to COVID-19 virus 06/02/2019  . Pre-diabetes   . PSA, INCREASED 07/09/2008  . Spinal stenosis of lumbar region    Past Surgical History:  Procedure Laterality Date  . ABDOMINAL AORTOGRAM W/LOWER EXTREMITY [name] 09/12/2018   Procedure: ABDOMINAL AORTOGRAM W/LOWER EXTREMITY;  Surgeon: [name], MD;  Location: [facility];  Service: Cardiovascular;  Laterality: [name];  . APPENDECTOMY     rupture  . CARDIAC CATHETERIZATION [name] 07/07/2015   Procedure: Left Heart Cath and Cors/Grafts Angiography;  Surgeon: [name], MD;  Location: [facility];  Service: Cardiovascular;  Laterality: [name];  . CATARACT EXTRACTION, BILATERAL    . CERVICAL SPINE SURGERY  12/2016   cervical stenosis [name])  . COLONOSCOPY  03/2010   HP polyp, diverticulosis, rpt 10 yrs ([name])  . CORONARY ARTERY BYPASS GRAFT  1990   3 vessel   . CYSTOSCOPY  12/23/10   Cope  . EYE SURGERY     cataract  . KNEE ARTHROSCOPY Right    x2  . LAMINOTOMY  1986   L5/S1 lumbar laminotomy for two ruptured discs/fusion  . LUMBAR LAMINECTOMY/DECOMPRESSION MICRODISCECTOMY [name] 07/13/2016   Procedure: LUMBAR TWO-THREE, LUMBAR THREE-FOUR, LUMBAR FOUR-FIVE LAMINECTOMY AND FORAMINOTOMY;  Surgeon: [name], MD;  Location: [facility];  Service: Neurosurgery;  Laterality: [name];  LAMINECTOMY AND FORAMINOTOMY L2-L3, L3-L4,L4-L5  . TOTAL HIP ARTHROPLASTY Bilateral 1999,2000  . TOTAL KNEE ARTHROPLASTY Right 03/06/2017   Procedure: RIGHT TOTAL KNEE ARTHROPLASTY;  Surgeon: Gaynelle Arabian, MD;  Location: [facility];  Service: Orthopedics;  Laterality: Right;   Family History  Problem Relation Age of Onset  . Stroke Mother   . Heart attack Mother   . Diabetes Mother   . Stroke Father   . Heart disease Father   . Kidney disease Father        PCKD   . Cancer Sister        throat  . Diabetes Brother   . Kidney disease Sister        PCKD  . Cancer Other        5/7 nephews with lung cancer   Social History   Socioeconomic History  . Marital status: Married    Spouse name: Not on file  . Number of children: Not on file  . Years of education: Not on file  . Highest education level: Not on file  Occupational History  . Not on file  Tobacco Use  . Smoking status: Former Smoker    Packs/day: 1.00    Years: 25.00    Pack years: 25.00    Types: Cigarettes    Quit date: 08/15/1988    Years since quitting: 31.3  . Smokeless tobacco: Never Used  Substance and Sexual Activity  . Alcohol use: No  Alcohol/week: 0.0 standard drinks  . Drug use: No  . Sexual activity: Yes  Other Topics Concern  . Not on file  Social History Narrative   Married, wife [name], for 25 years in 2016. 1 daughter and 3 grandkids from first marriage.    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    Diet: good water, fruits/vegetables daily      OSA eval recommended while hospitalized with Covid 05/2019   Social Determinants of Health   Financial Resource Strain:   . Difficulty of Paying Living Expenses:   Food Insecurity:   . Worried About Charity fundraiser in the Last Year:   . Arboriculturist in the Last Year:   Transportation Needs:   . Film/video editor (Medical):   Marland Kitchen Lack of Transportation (Non-Medical):   Physical Activity:   . Days of Exercise per Week:   . Minutes of Exercise per Session:   Stress:   . Feeling of Stress :   Social Connections:   . Frequency of Communication with Friends and Family:   . Frequency of Social Gatherings with Friends and Family:   . Attends Religious Services:   . Active Member of Clubs or Organizations:   . Attends Archivist Meetings:   Marland Kitchen Marital  Status:     Outpatient Encounter Medications as of 12/09/2019  Medication Sig  . acetaminophen (TYLENOL) 650 MG CR tablet Take 1,300 mg by mouth every 8 (eight) hours as needed for pain.  Marland Kitchen albuterol (VENTOLIN HFA) 108 (90 Base) MCG/ACT inhaler Inhale 2 puffs into the lungs every 6 (six) hours as needed for wheezing or shortness of breath.  Marland Kitchen aspirin EC 81 MG EC tablet Take 1 tablet (81 mg total) by mouth daily.  . Cyanocobalamin (B-12) 1000 MCG SUBL Place 1 tablet under the tongue daily.  Marland Kitchen doxycycline (VIBRA-TABS) 100 MG tablet Take 1 tablet (100 mg total) by mouth 2 (two) times daily.  Marland Kitchen ezetimibe (ZETIA) 10 MG tablet Take 1 tablet (10 mg total) by mouth daily.  . furosemide (LASIX) 20 MG tablet Take 1 tablet (20 mg total) by mouth daily as needed.  . gabapentin (NEURONTIN) 300 MG capsule Take 300 mg by mouth 3 (three) times daily.   . isosorbide mononitrate (IMDUR) 30 MG 24 hr tablet Take 1 tablet (30 mg total) by mouth at bedtime.  Marland Kitchen ketoconazole (NIZORAL) 2 % cream Apply 1 application topically daily as needed for irritation.   Marland Kitchen losartan (COZAAR) 25 MG tablet Take 1 tablet (25 mg total) by mouth daily.  . metoprolol succinate (TOPROL XL) 25 MG 24 hr tablet Take 0.5 tablets (12.5 mg total) by mouth daily.  . nitroGLYCERIN (NITROSTAT) 0.4 MG SL tablet Place 1 tablet (0.4 mg total) under the tongue every 5 (five) minutes as needed for chest pain.  . polyethylene glycol (MIRALAX / GLYCOLAX) 17 g packet Take 17 g by mouth every other day.   . ranolazine (RANEXA) 500 MG 12 hr tablet TAKE ONE TABLET TWICE DAILY  . simvastatin (ZOCOR) 40 MG tablet Take 1 tablet (40 mg total) by mouth daily.  Marland Kitchen tolterodine (DETROL LA) 4 MG 24 hr capsule Take 4 mg by mouth daily.   No facility-administered encounter medications on file as of 12/09/2019.    Activities of Daily Living In your present state of health,  do you have any difficulty performing the following activities: 12/09/2019 06/19/2019  Hearing? N  N  Vision? N N  Difficulty concentrating or making decisions? N N  Walking or climbing stairs? N N  Comment - -  Dressing or bathing? N N  Doing errands, shopping? N N  Preparing Food and eating ? N -  Using the Toilet? N -  In the past six months, have you accidently leaked urine? N -  Do you have problems with loss of bowel control? N -  Managing your Medications? N -  Managing your Finances? N -  Housekeeping or managing your Housekeeping? N -  Some recent data might be hidden    Patient Care Team: Ria Bush, MD as PCP - General (Family Medicine) [name], MD as PCP - Cardiology (Cardiology)   Assessment:   This is a routine wellness examination for [place].  Exercise Activities and Dietary recommendations Current Exercise Habits: The patient does not participate in regular exercise at present;Home exercise routine, Type of exercise: stretching, Time (Minutes): 15, Frequency (Times/Week): 5, Weekly Exercise (Minutes/Week): 75, Intensity: Mild, Exercise limited by: None identified  Goals    . Follow up with Primary Care Provider     Starting 12/03/18, I will continue to take medications as prescribed and to keep appointments with PCP as scheduled.     . Patient Stated     12/09/2019, I will continue to maintain and take medications as prescribed.       Fall Risk Fall Risk  12/09/2019 12/03/2018 11/30/2017 11/27/2017 11/16/2017  Falls in the past year? 0 1 No No No  Number falls in past yr: 0 1 - - -  Comment - - - - -  Injury with Fall? 0 1 - - -  Comment - bruising only - - -  Risk for fall due to : Medication side effect - - - -  Follow up Falls evaluation completed;Falls prevention discussed - - - -   Is the patient's home free of loose throw rugs in walkways, pet beds, electrical cords, etc?   yes      Grab bars in the bathroom? yes      Handrails on the stairs?   yes      Adequate lighting?   yes  Timed Get Up and Go Performed: [name]  Depression  Screen PHQ 2/9 Scores 12/09/2019 12/03/2018 11/30/2017 10/17/2017  PHQ - 2 Score 0 0 0 0  PHQ- 9 Score 0 0 0 -    Cognitive Function MMSE - Mini Mental State Exam 12/09/2019 12/03/2018 11/30/2017 11/29/2016  Orientation to time 5 5 5 5   Orientation to Place 5 5 5 5   Registration 3 3 3 3   Attention/ Calculation 5 0 0 0  Recall 3 3 3 3   Language- name 2 objects - 0 0 0  Language- repeat 1 1 1 1   Language- follow 3 step command - 0 3 3  Language- read & follow direction - 0 0 0  Write a sentence - 0 0 0  Copy design - 0 0 0  Total score - 17 20 20   Mini Cog  Mini-Cog screen was completed. Maximum score is 22. A value of 0 denotes this part of the MMSE was not completed or the patient failed this part of the Mini-Cog screening.       Immunization History  Administered Date(s) Administered  . Fluad Quad(high Dose 65+) 04/17/2019  . Influenza Whole 08/15/2000, 07/04/2007, 05/09/2008, 05/31/2010  .  Influenza, High Dose Seasonal PF 05/15/2013, 06/25/2015  . Influenza,inj,Quad PF,6+ Mos 04/17/2014, 05/18/2016, 06/07/2018  . Influenza-Unspecified 05/22/2017  . PFIZER SARS-COV-2 Vaccination 09/07/2019, 09/28/2019  . Pneumococcal Conjugate-13 02/09/2015  . Pneumococcal Polysaccharide-23 08/15/2000, 10/29/2008  . Td 08/15/2005  . Tdap 02/10/2018  . Zoster 02/04/2009  . Zoster Recombinat (Shingrix) 04/05/2017, 07/25/2017    Qualifies for Shingles Vaccine: Completed series  Screening Tests Health Maintenance  Topic Date Due  . DTAP VACCINES (1) 05/31/1941  . INFLUENZA VACCINE  03/15/2020  . HEMOGLOBIN A1C  03/24/2020  . OPHTHALMOLOGY EXAM  09/03/2020  . FOOT EXAM  09/24/2020  . DTaP/Tdap/Td (3 - Td) 02/11/2028  . TETANUS/TDAP  02/11/2028  . COVID-19 Vaccine  Completed  . PNA vac Low Risk Adult  Completed   Cancer Screenings: Lung: Low Dose CT Chest recommended if Age 4-80 years, 30 pack-year currently smoking OR have quit w/in 15 years. Patient does not qualify. Colorectal:  completed 03/23/2010  Additional Screenings:  Hepatitis C Screening: [name]      Plan:    Patient will maintain and continue medications as prescribed.  I have personally reviewed and noted the following in the patient's chart:   . Medical and social history . Use of alcohol, tobacco or illicit drugs  . Current medications and supplements . Functional ability and status . Nutritional status . Physical activity . Advanced directives . List of other physicians . Hospitalizations, surgeries, and ER visits in previous 12 months . Vitals . Screenings to include cognitive, depression, and falls . Referrals and appointments  In addition, I have reviewed and discussed with patient certain preventive protocols, quality metrics, and best practice recommendations. A written personalized care plan for preventive services as well as general preventive health recommendations were provided to patient.     Andrez Grime, LPN  [phone]

## 2019-12-09 NOTE — Progress Notes (Signed)
PCP notes:  Health Maintenance: No gaps noted   Abnormal Screenings: none   Patient concerns: none   Nurse concerns: none   Next PCP appt: 12/19/2019 @ 11:30 am

## 2019-12-12 ENCOUNTER — Encounter: Payer: PPO | Admitting: Family Medicine

## 2019-12-16 ENCOUNTER — Other Ambulatory Visit: Payer: PPO

## 2019-12-16 ENCOUNTER — Other Ambulatory Visit: Payer: Self-pay

## 2019-12-18 ENCOUNTER — Emergency Department: Payer: PPO

## 2019-12-18 ENCOUNTER — Emergency Department
Admission: EM | Admit: 2019-12-18 | Discharge: 2019-12-18 | Disposition: A | Discharge status: Home / Self Care | Payer: PPO | Attending: Student in an Organized Health Care Education/Training Program | Admitting: Student in an Organized Health Care Education/Training Program

## 2019-12-18 ENCOUNTER — Other Ambulatory Visit: Payer: Self-pay

## 2019-12-18 ENCOUNTER — Telehealth: Payer: Self-pay

## 2019-12-18 ENCOUNTER — Encounter: Payer: Self-pay | Admitting: Emergency Medicine

## 2019-12-18 DIAGNOSIS — Z96643 Presence of artificial hip joint, bilateral: Secondary | ICD-10-CM | POA: Insufficient documentation

## 2019-12-18 DIAGNOSIS — I2510 Atherosclerotic heart disease of native coronary artery without angina pectoris: Secondary | ICD-10-CM | POA: Diagnosis not present

## 2019-12-18 DIAGNOSIS — Z8616 Personal history of COVID-19: Secondary | ICD-10-CM | POA: Insufficient documentation

## 2019-12-18 DIAGNOSIS — J40 Bronchitis, not specified as acute or chronic: Secondary | ICD-10-CM | POA: Insufficient documentation

## 2019-12-18 DIAGNOSIS — I509 Heart failure, unspecified: Secondary | ICD-10-CM | POA: Insufficient documentation

## 2019-12-18 DIAGNOSIS — R791 Abnormal coagulation profile: Secondary | ICD-10-CM | POA: Diagnosis not present

## 2019-12-18 DIAGNOSIS — Z79899 Other long term (current) drug therapy: Secondary | ICD-10-CM | POA: Insufficient documentation

## 2019-12-18 DIAGNOSIS — I110 Hypertensive heart disease with heart failure: Secondary | ICD-10-CM | POA: Insufficient documentation

## 2019-12-18 DIAGNOSIS — R059 Cough, unspecified: Secondary | ICD-10-CM

## 2019-12-18 DIAGNOSIS — I252 Old myocardial infarction: Secondary | ICD-10-CM | POA: Diagnosis not present

## 2019-12-18 DIAGNOSIS — Z96651 Presence of right artificial knee joint: Secondary | ICD-10-CM | POA: Diagnosis not present

## 2019-12-18 DIAGNOSIS — R05 Cough: Secondary | ICD-10-CM | POA: Diagnosis not present

## 2019-12-18 DIAGNOSIS — Z87891 Personal history of nicotine dependence: Secondary | ICD-10-CM | POA: Insufficient documentation

## 2019-12-18 DIAGNOSIS — Z7982 Long term (current) use of aspirin: Secondary | ICD-10-CM | POA: Diagnosis not present

## 2019-12-18 DIAGNOSIS — R0602 Shortness of breath: Secondary | ICD-10-CM | POA: Diagnosis not present

## 2019-12-18 DIAGNOSIS — Z951 Presence of aortocoronary bypass graft: Secondary | ICD-10-CM | POA: Diagnosis not present

## 2019-12-18 HISTORY — DX: Heart failure, unspecified: I50.9

## 2019-12-18 LAB — BASIC METABOLIC PANEL
Anion gap: 7 (ref 5–15)
BUN: 26 mg/dL — ABNORMAL HIGH (ref 8–23)
CO2: 26 mmol/L (ref 22–32)
Calcium: 8.9 mg/dL (ref 8.9–10.3)
Chloride: 106 mmol/L (ref 98–111)
Creatinine, Ser: 1.36 mg/dL — ABNORMAL HIGH (ref 0.61–1.24)
GFR calc Af Amer: 57 mL/min — ABNORMAL LOW (ref >60)
GFR calc non Af Amer: 49 mL/min — ABNORMAL LOW (ref >60)
Glucose, Bld: 103 mg/dL — ABNORMAL HIGH (ref 70–99)
Potassium: 4.3 mmol/L (ref 3.5–5.1)
Sodium: 139 mmol/L (ref 135–145)

## 2019-12-18 LAB — CBC
HCT: 40.6 % (ref 39.0–52.0)
Hemoglobin: 13.9 g/dL (ref 13.0–17.0)
MCH: 32.2 pg (ref 26.0–34.0)
MCHC: 34.2 g/dL (ref 30.0–36.0)
MCV: 94 fL (ref 80.0–100.0)
Platelets: 167 10*3/uL (ref 150–400)
RBC: 4.32 MIL/uL (ref 4.22–5.81)
RDW: 12.7 % (ref 11.5–15.5)
WBC: 6.7 10*3/uL (ref 4.0–10.5)
nRBC: 0 % (ref 0.0–0.2)

## 2019-12-18 LAB — TROPONIN I (HIGH SENSITIVITY)
Troponin I (High Sensitivity): 5 ng/L (ref <18)
Troponin I (High Sensitivity): 5 ng/L (ref <18)

## 2019-12-18 LAB — FIBRIN DERIVATIVES D-DIMER (ARMC ONLY): Fibrin derivatives D-dimer (ARMC): 938.52 ng/mL (FEU) — ABNORMAL HIGH (ref 0.00–499.00)

## 2019-12-18 MED ORDER — IPRATROPIUM-ALBUTEROL 0.5-2.5 (3) MG/3ML IN SOLN
3.0000 mL | Freq: Once | RESPIRATORY_TRACT | Status: AC
  Administered 2019-12-18: 3 mL via RESPIRATORY_TRACT
  Filled 2019-12-18: qty 6

## 2019-12-18 MED ORDER — BENZONATATE 100 MG PO CAPS
100.0000 mg | ORAL_CAPSULE | Freq: Four times a day (QID) | ORAL | 0 refills | Status: DC | PRN
Start: 2019-12-18 — End: 2020-02-25

## 2019-12-18 MED ORDER — LIDOCAINE HCL (PF) 1 % IJ SOLN
5.0000 mL | Freq: Once | INTRAMUSCULAR | Status: AC
  Administered 2019-12-18: 5 mL via INTRADERMAL
  Filled 2019-12-18: qty 5

## 2019-12-18 MED ORDER — DIPHENHYDRAMINE HCL 25 MG PO CAPS: 50.0000 mg | ORAL_CAPSULE | Freq: Once | ORAL | Status: AC

## 2019-12-18 MED ORDER — PREDNISONE 20 MG PO TABS: 40.0000 mg | ORAL_TABLET | Freq: Every day | ORAL | 0 refills | Status: AC

## 2019-12-18 MED ORDER — IPRATROPIUM-ALBUTEROL 0.5-2.5 (3) MG/3ML IN SOLN
3.0000 mL | Freq: Once | RESPIRATORY_TRACT | Status: AC
  Administered 2019-12-18: 3 mL via RESPIRATORY_TRACT

## 2019-12-18 MED ORDER — HYDROCORTISONE NA SUCCINATE PF 250 MG IJ SOLR
200.0000 mg | Freq: Once | INTRAMUSCULAR | Status: AC
  Administered 2019-12-18: 200 mg via INTRAVENOUS
  Filled 2019-12-18 (×2): qty 200

## 2019-12-18 MED ORDER — SODIUM CHLORIDE 0.9 % IV BOLUS
250.0000 mL | Freq: Once | INTRAVENOUS | Status: AC
  Administered 2019-12-18: 250 mL via INTRAVENOUS

## 2019-12-18 MED ORDER — IPRATROPIUM-ALBUTEROL 0.5-2.5 (3) MG/3ML IN SOLN
3.0000 mL | Freq: Once | RESPIRATORY_TRACT | Status: AC
  Administered 2019-12-18: 3 mL via RESPIRATORY_TRACT
  Filled 2019-12-18: qty 3

## 2019-12-18 MED ORDER — DIPHENHYDRAMINE HCL 50 MG/ML IJ SOLN
50.0000 mg | Freq: Once | INTRAMUSCULAR | Status: AC
  Administered 2019-12-18: 50 mg via INTRAVENOUS
  Filled 2019-12-18: qty 1

## 2019-12-18 MED ORDER — METHYLPREDNISOLONE SODIUM SUCC 125 MG IJ SOLR
125.0000 mg | Freq: Once | INTRAMUSCULAR | Status: DC
  Filled 2019-12-18: qty 2

## 2019-12-18 MED ORDER — METOPROLOL SUCCINATE ER 25 MG PO TB24: 12.5000 mg | ORAL_TABLET | Freq: Every day | ORAL | Status: DC

## 2019-12-18 MED ORDER — IOHEXOL 350 MG/ML SOLN
75.0000 mL | Freq: Once | INTRAVENOUS | Status: AC | PRN
  Administered 2019-12-18: 75 mL via INTRAVENOUS

## 2019-12-18 MED ORDER — METOPROLOL SUCCINATE ER 25 MG PO TB24
12.5000 mg | ORAL_TABLET | Freq: Every day | ORAL | Status: DC
  Administered 2019-12-18: 12.5 mg via ORAL
  Filled 2019-12-18: qty 0.5

## 2019-12-18 NOTE — Telephone Encounter (Signed)
Pt calling with SOB,chest tightness, prod cough with yellow and green,chest congestion,? Fever,afraid going to go into pneumonia again. Pt is SOB when just talking. Pt had bad experience at [facility] and pt will go to [facility] ED now by his wife taking him by car. Pt refuses 911. Pt will go to ED now. FYI to Dr Darnell Level.

## 2019-12-18 NOTE — ED Notes (Signed)
Per CT, give benadryl at 1930 d/t scheduling of CT

## 2019-12-18 NOTE — ED Notes (Signed)
See triage note, pt reports cough and " centralized chest irritation/congetion" for the past 2 days. Denies chest pain. Reports he has intermittent SHOB since having COVID/pneumonia in October. Pt in NAD at this time. Speaking in complete sentences.  Productive cough present, reports coughing up yellow sputum,. 95% on RA.  Denies N/V.

## 2019-12-18 NOTE — ED Notes (Signed)
Pharmacy messaged to send missing medications

## 2019-12-18 NOTE — ED Provider Notes (Signed)
[facility] Emergency Department Provider Note    First MD Initiated Contact with Patient 12/18/19 1352     (approximate)  I have reviewed the triage vital signs and the nursing notes.   HISTORY  Chief Complaint Cough and Shortness of Breath    HPI [name] is a 79 y.o. male extensive past medical history as listed below presents to the ER for 2 to 3 days of progressively worsening cough congestion as well as chest tightness and pleuritic discomfort.  No measured fevers.  Was recently on doxycycline for tick bite completed last week.  Does not wear home oxygen.  Was admitted for Covid illness and the fall last year.  Has received his vaccinations.  Has had a history of pneumonia prior to Covid feels like that is similar to what is causing his discomfort right now.  Is not any blood thinners.    Past Medical History:  Diagnosis Date  . Actinic keratosis   . BENIGN PROSTATIC HYPERTROPHY, WITH URINARY OBSTRUCTION 09/06/2007  . CAD s/p CABG    a. 1990 s/p MI-->CABG x 2; b. 2002 s/p BMS to LCX; c. 06/2015 Cath: LM 50ost, LAD 100ost/p, 39m/d, D2 nl, LCX  patent stent, RCA 80p (small), LIMA->LAD nl, VG->D2 nl-->Med Rx.  . Carotid arterial disease ([place])    a. 08/2016 Carotid U/S: <39% bilat.  . CHF (congestive heart failure) ([place])   . 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 Community acquired pneumonia of right lower lobe of lung 06/05/2017  . COVID-19 virus infection 05/30/2019   Covid PNA with hospitalization 05/2019  . 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 DVT, HX OF 1998  . GERD 04/30/2007  . Headache    hx migraines  . History of hiatal hernia   . History of shingles   . HYPERLIPIDEMIA 04/26/2007  . HYPERTENSION 04/30/2007  . INGUINAL HERNIA, RIGHT 05/26/2010  . Laceration of skin of left hand 03/08/2018  . Lumbar disc disease with radiculopathy   . Myocardial infarction ([place])    1989, 2002  . OSA (obstructive sleep apnea)    a. did not  tolerate CPAP.  Marland Kitchen Osteoarthritis   . PAD (peripheral artery disease) ([place])    a. 07/2017 LE duplex: RSFA 75-74m, LSFA 75-45m, 50-74d.  . Pneumonia 07/2014   [facility] hospitalization  . Pneumonia due to COVID-19 virus 06/02/2019  . Pre-diabetes   . PSA, INCREASED 07/09/2008  . Spinal stenosis of lumbar region    Family History  Problem Relation Age of Onset  . Stroke Mother   . Heart attack Mother   . Diabetes Mother   . Stroke Father   . Heart disease Father   . Kidney disease Father        PCKD  . Cancer Sister        throat  . Diabetes Brother   . Kidney disease Sister        PCKD  . Cancer Other        5/7 nephews with lung cancer   Past Surgical History:  Procedure Laterality Date  . ABDOMINAL AORTOGRAM W/LOWER EXTREMITY [name] 09/12/2018   Procedure: ABDOMINAL AORTOGRAM W/LOWER EXTREMITY;  Surgeon: [name], MD;  Location: [place] [facility];  Service: Cardiovascular;  Laterality: [name];  . APPENDECTOMY     rupture  . CARDIAC CATHETERIZATION [name] 07/07/2015   Procedure: Left Heart Cath and Cors/Grafts Angiography;  Surgeon: [name], MD;  Location: [place] [facility];  Service: Cardiovascular;  Laterality:  [name];  . CATARACT EXTRACTION, BILATERAL    . CERVICAL SPINE SURGERY  12/2016   cervical stenosis [name])  . COLONOSCOPY  03/2010   HP polyp, diverticulosis, rpt 10 yrs ([name])  . CORONARY ARTERY BYPASS GRAFT  1990   3 vessel   . CYSTOSCOPY  12/23/10   [facility]  . EYE SURGERY     cataract  . KNEE ARTHROSCOPY Right    x2  . LAMINOTOMY  1986   L5/S1 lumbar laminotomy for two ruptured discs/fusion  . LUMBAR LAMINECTOMY/DECOMPRESSION MICRODISCECTOMY [name] 07/13/2016   Procedure: LUMBAR TWO-THREE, LUMBAR THREE-FOUR, LUMBAR FOUR-FIVE LAMINECTOMY AND FORAMINOTOMY;  Surgeon: [name], MD;  Location: [place];  Service: Neurosurgery;  Laterality: [name];  LAMINECTOMY AND FORAMINOTOMY L2-L3, L3-L4,L4-L5  . TOTAL HIP ARTHROPLASTY Bilateral 1999,2000  . TOTAL KNEE  ARTHROPLASTY Right 03/06/2017   Procedure: RIGHT TOTAL KNEE ARTHROPLASTY;  Surgeon: Gaynelle Arabian, MD;  Location: [facility];  Service: Orthopedics;  Laterality: Right;   Patient Active Problem List   Diagnosis Date Noted  . Tick bite of left thigh 12/05/2019  . URI (upper respiratory infection) 12/05/2019  . Coccydynia 09/20/2019  . Sinus congestion 07/19/2019  . Lump in the abdomen 07/08/2019  . 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 Anxiety 06/04/2019  . Hand swelling 12/07/2018  . Chronic right shoulder pain 09/10/2018  . Fall 03/08/2018  . Cervical stenosis of spinal canal 06/05/2017  . Pedal edema 06/05/2017  . Health maintenance examination 12/06/2016  . 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 Overweight (BMI 25.0-29.9) 07/04/2016  . Low vitamin B12 level 01/01/2016  . Medicare annual wellness visit, subsequent 07/03/2015  . Advanced care planning/counseling discussion 07/03/2015  . Controlled diabetes mellitus type 2 with complications ([place]) [id number]  . Chest pain 05/04/2015  . Ex-smoker 05/04/2015  . 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 Spermatocele 04/02/2013  . Syncopal vertigo 11/04/2010  . Lumbar disc disease with radiculopathy 11/04/2010  . Inguinal hernia 05/26/2010  . 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 PVD (peripheral vascular disease) with claudication ([place]) 08/11/2009  . 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 Carotid stenosis 07/09/2008  . 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 Essential hypertension 04/30/2007  . GERD 04/30/2007  . Hyperlipidemia 04/26/2007  . OA (osteoarthritis) of knee 04/26/2007  . DVT, HX OF 04/26/2007      Prior to Admission medications   Medication Sig Start Date End Date Taking? Authorizing Provider  acetaminophen (TYLENOL) 650 MG CR tablet Take 1,300 mg by mouth every 8 (eight) hours as needed for pain.     [provider]  albuterol (VENTOLIN HFA) 108 (90 Base) MCG/ACT inhaler Inhale 2 puffs into the lungs every 6 (six) hours as needed for wheezing or shortness of breath. 05/22/19   Ria Bush, MD  aspirin EC 81 MG EC tablet Take 1 tablet (81 mg total) by mouth daily. 06/26/19   Elgergawy, Silver Huguenin, MD  benzonatate (TESSALON PERLES) 100 MG capsule Take 1 capsule (100 mg total) by mouth every 6 (six) hours as needed for cough. 12/18/19 12/17/20  Merlyn Lot, MD  Cyanocobalamin (B-12) 1000 MCG SUBL Place 1 tablet under the tongue daily. 12/07/18   Ria Bush, MD  doxycycline (VIBRA-TABS) 100 MG tablet Take 1 tablet (100 mg total) by mouth 2 (two) times daily. 12/05/19   Tower, Wynelle Fanny, MD  ezetimibe (ZETIA) 10 MG tablet Take 1 tablet (10 mg total) by mouth daily. 10/22/19  [name], MD  furosemide (LASIX) 20 MG tablet Take 1 tablet (20 mg total) by mouth daily as needed. 10/22/19   [name], MD  gabapentin (NEURONTIN) 300 MG capsule Take 300 mg by mouth 3 (three) times daily.     [provider]  isosorbide mononitrate (IMDUR) 30 MG 24 hr tablet Take 1 tablet (30 mg total) by mouth at bedtime. 07/02/19 10/21/28  [name], MD  ketoconazole (NIZORAL) 2 % cream Apply 1 application topically daily as needed for irritation.     [provider]  losartan (COZAAR) 25 MG tablet Take 1 tablet (25 mg total) by mouth daily. 07/02/19 10/21/28  [name], MD  metoprolol succinate (TOPROL XL) 25 MG 24 hr tablet Take 0.5 tablets (12.5 mg total) by mouth daily. 07/02/19   [name], MD  nitroGLYCERIN (NITROSTAT) 0.4 MG SL tablet Place 1 tablet (0.4 mg total) under the tongue every 5 (five) minutes as needed for chest pain. 12/07/18   Ria Bush, MD  polyethylene glycol (MIRALAX / GLYCOLAX) 17 g packet Take 17 g by mouth every other day.     [provider]  predniSONE (DELTASONE) 20 MG tablet Take 2 tablets (40 mg total) by mouth  daily for 5 days. 12/18/19 12/23/19  Merlyn Lot, MD  ranolazine (RANEXA) 500 MG 12 hr tablet TAKE ONE TABLET TWICE DAILY 12/02/19   [name], MD  simvastatin (ZOCOR) 40 MG tablet Take 1 tablet (40 mg total) by mouth daily. 10/22/19   [name], MD  tolterodine (DETROL LA) 4 MG 24 hr capsule Take 4 mg by mouth daily.    [provider]    Allergies Vioxx [rofecoxib], Contrast media [iodinated diagnostic agents], and Morphine    Social History Social History   Tobacco Use  . Smoking status: Former Smoker    Packs/day: 1.00    Years: 25.00    Pack years: 25.00    Types: Cigarettes    Quit date: 08/15/1988    Years since quitting: 31.3  . Smokeless tobacco: Never Used  Substance Use Topics  . Alcohol use: No    Alcohol/week: 0.0 standard drinks  . Drug use: No    Review of Systems Patient denies headaches, rhinorrhea, blurry vision, numbness, shortness of breath, chest pain, edema, cough, abdominal pain, nausea, vomiting, diarrhea, dysuria, fevers, rashes or hallucinations unless otherwise stated above in HPI. ____________________________________________   PHYSICAL EXAM:  VITAL SIGNS: Vitals:   12/18/19 1857 12/18/19 1900  BP:  (!) 146/57  Pulse: (!) 104 99  Resp:  12  Temp:    SpO2:  93%    Constitutional: Alert and oriented.  Eyes: Conjunctivae are normal.  Head: Atraumatic. Nose: No congestion/rhinnorhea. Mouth/Throat: Mucous membranes are moist.   Neck: No stridor. Painless ROM.  Cardiovascular: Normal rate, regular rhythm. Grossly normal heart sounds.  Good peripheral circulation. Respiratory: Normal respiratory effort.  No retractions.  Lungs with diffuse coarse wheezing throughout. Gastrointestinal: Soft and nontender. No distention. No abdominal bruits. No CVA tenderness. Genitourinary:  Musculoskeletal: No lower extremity tenderness nor edema.  No joint effusions. Neurologic:  Normal speech and language. No gross focal neurologic  deficits are appreciated. No facial droop Skin:  Skin is warm, dry and intact. No rash noted. Psychiatric: Mood and affect are normal. Speech and behavior are normal.  ____________________________________________   LABS (all labs ordered are listed, but only abnormal results are displayed)  Results for orders placed or performed during the  hospital encounter of 12/18/19 (from the past 24 hour(s))  Basic metabolic panel     Status: Abnormal   Collection Time: 12/18/19 11:13 AM  Result Value Ref Range   Sodium 139 135 - 145 mmol/L   Potassium 4.3 3.5 - 5.1 mmol/L   Chloride 106 98 - 111 mmol/L   CO2 26 22 - 32 mmol/L   Glucose, Bld 103 (H) 70 - 99 mg/dL   BUN 26 (H) 8 - 23 mg/dL   Creatinine, Ser 1.36 (H) 0.61 - 1.24 mg/dL   Calcium 8.9 8.9 - 10.3 mg/dL   GFR calc non Af Amer 49 (L) >60 mL/min   GFR calc Af Amer 57 (L) >60 mL/min   Anion gap 7 5 - 15  CBC     Status: None   Collection Time: 12/18/19 11:13 AM  Result Value Ref Range   WBC 6.7 4.0 - 10.5 K/uL   RBC 4.32 4.22 - 5.81 MIL/uL   Hemoglobin 13.9 13.0 - 17.0 g/dL   HCT 40.6 39.0 - 52.0 %   MCV 94.0 80.0 - 100.0 fL   MCH 32.2 26.0 - 34.0 pg   MCHC 34.2 30.0 - 36.0 g/dL   RDW 12.7 11.5 - 15.5 %   Platelets 167 150 - 400 K/uL   nRBC 0.0 0.0 - 0.2 %  Fibrin derivatives D-Dimer ([facility] only)     Status: Abnormal   Collection Time: 12/18/19  2:47 PM  Result Value Ref Range   Fibrin derivatives D-dimer ([facility]) 938.52 (H) 0.00 - 499.00 ng/mL (FEU)  Troponin I (High Sensitivity)     Status: None   Collection Time: 12/18/19  2:47 PM  Result Value Ref Range   Troponin I (High Sensitivity) 5 <18 ng/L  Troponin I (High Sensitivity)     Status: None   Collection Time: 12/18/19  4:59 PM  Result Value Ref Range   Troponin I (High Sensitivity) 5 <18 ng/L   ____________________________________________  EKG My review and personal interpretation at Time:    Indication: sob  Rate: 75  Rhythm: sinus Axis: left Other: nonspecific  st abn, no stemi ____________________________________________  RADIOLOGY  I personally reviewed all radiographic images ordered to evaluate for the above acute complaints and reviewed radiology reports and findings.  These findings were personally discussed with the patient.  Please see medical record for radiology report.  ____________________________________________   PROCEDURES  Procedure(s) performed:  Procedures    Critical Care performed: no ____________________________________________   INITIAL IMPRESSION / ASSESSMENT AND PLAN / ED COURSE  Pertinent labs & imaging results that were available during my care of the patient were reviewed by me and considered in my medical decision making (see chart for details).   DDX: Asthma, copd, CHF, pna, ptx, malignancy, Pe, anemia [name] is a 79 y.o. who presents to the ED with presentation as described above.  He is afebrile hemodynamically stable in no acute distress but does have frequent bronchitic sounding cough and diffuse wheezing on exam.  Also complaining some pleuritic chest discomfort in setting of post Covid.  I will lower suspicion for CHF or ACS.  Will give nebs as well as steroids.  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  Will order serial enzymes.  Will observe on telemetry.  His abdominal exam is soft benign.  Will order D-dimer to further stratify as the patient does have a contrast allergy.  The patient will be placed on continuous pulse oximetry and  telemetry for monitoring.  Laboratory evaluation will be sent to evaluate for the above complaints.     Clinical Course as of Dec 18 2103  Wed Dec 18, 2019  1546 D-dimer is elevated in the setting of post Covid with her symptoms I will order CT angiogram does have significant improvement after DuoNeb's.  May simply be a significant bronchitis exacerbation but do want to rule out VTE.   [PR]  1712 Patient reassessed.  Again  feeling improved.  I have a high index of suspicion that this is bronchitis and post Covid syndrome but do want to exclude VTE.  Still awaiting repeat troponin.   [PR]  2057 T imaging is reassuring.  Patient feels improved after nebulizer as well as steroid.  Patient's blood pressure is mildly elevated borderline tachycardia but he is due for his evening medicines.  Recommended given his meds here but patient states he would prefer to take them at home.  He does not have any hypoxia.  I do suspect he has underlying COPD.  Does have inhalers at home I think he is a stable and appropriate for outpatient follow-up.  I do not see clinical evidence of pneumonia at this time.  We discussed strict return precautions.  Patient and family member at bedside agreeable to plan.   [PR]    Clinical Course User Index [PR] Merlyn Lot, MD    The patient was evaluated in Emergency Department today for the symptoms described in the history of present illness. He/she was evaluated in the context of the global COVID-19 pandemic, which necessitated consideration that the patient might be at risk for infection with the SARS-CoV-2 virus that causes COVID-19. Institutional protocols and algorithms that pertain to the evaluation of patients at risk for COVID-19 are in a state of rapid change based on information released by regulatory bodies including the CDC and federal and state organizations. These policies and algorithms were followed during the patient's care in the ED.  As part of my medical decision making, I reviewed the following data within the [place] notes reviewed and incorporated, Labs reviewed, notes from prior ED visits and [place] Controlled Substance Database   ____________________________________________   FINAL CLINICAL IMPRESSION(S) / ED DIAGNOSES  Final diagnoses:  Bronchitis  Cough      NEW MEDICATIONS STARTED DURING THIS VISIT:  New Prescriptions   BENZONATATE  (TESSALON PERLES) 100 MG CAPSULE    Take 1 capsule (100 mg total) by mouth every 6 (six) hours as needed for cough.   PREDNISONE (DELTASONE) 20 MG TABLET    Take 2 tablets (40 mg total) by mouth daily for 5 days.     Note:  This document was prepared using Dragon voice recognition software and may include unintentional dictation errors.    Merlyn Lot, MD 12/18/19 2105

## 2019-12-18 NOTE — Telephone Encounter (Signed)
Gabapentin Last OV:  09/25/19, 3 mo DM f/u Next OV:  none

## 2019-12-18 NOTE — Telephone Encounter (Signed)
Noted  

## 2019-12-18 NOTE — ED Notes (Signed)
Pt in CT.

## 2019-12-18 NOTE — ED Notes (Signed)
CT notified of solucortef given

## 2019-12-18 NOTE — Telephone Encounter (Signed)
Noted. Agree.  plz call tomorrow for update.

## 2019-12-18 NOTE — ED Triage Notes (Signed)
Pt to ER with c/o cough, and chest congestion for approximately 3 days.  Pt states started as allergies and quickly moved into his chest. Pt states hx of pneumonia in past and Covid in October.  Pt states called his PCP who told him there was nothing they could do and he needed to come here.  Pt reports cough productive of yellow sputum.

## 2019-12-18 NOTE — ED Notes (Signed)
Pt difficult stick, 2 RN attempt IV no success. IV team order placed. Dr Quentin Cornwall aware

## 2019-12-18 NOTE — ED Notes (Signed)
Pt c/o fast heart rate d/t not taking metoprolol this morning. Dr Quentin Cornwall notified. Medication ordered

## 2019-12-18 NOTE — ED Notes (Signed)
Pharmacy notified to send metoprolol

## 2019-12-19 ENCOUNTER — Encounter: Payer: PPO | Admitting: Family Medicine

## 2019-12-19 NOTE — Telephone Encounter (Signed)
Spoke with pt for an update.  States he is doing better.  Not coughing as much and is a little hoarse.  Told he has bronchitis, tx prednisone.  Says he was given neb tx and other procedures to check for blood clots, neg.  Pt is asking when should he r/s wellness.  He's planning to be out of town 12/23/19 and will be gone for 1 wk.  FYI to Dr. Darnell Level.

## 2019-12-19 NOTE — Telephone Encounter (Signed)
Spoke with pt answering all questions to his satisfaction.

## 2019-12-19 NOTE — Telephone Encounter (Signed)
Please reschedule when he returns to town.

## 2019-12-19 NOTE — Telephone Encounter (Signed)
Lvm asking to call back.  Per Dr. Darnell Level, pt needs to r/s wellness and lab visits when he returns to town.

## 2019-12-19 NOTE — Telephone Encounter (Signed)
Patient called back. R/s'd patient's Wellness and lab visit    Patient is requesting a call back asap! Asked for a call back before we left the office today if possible  He stated he was in the hospital and they prescribed steroids but he really thinks he may need an antibiotic.

## 2019-12-20 ENCOUNTER — Telehealth: Payer: Self-pay

## 2019-12-20 MED ORDER — GABAPENTIN 300 MG PO CAPS: 300.0000 mg | ORAL_CAPSULE | Freq: Three times a day (TID) | ORAL | 1 refills | Status: DC

## 2019-12-20 MED ORDER — FLUTICASONE PROPIONATE 50 MCG/ACT NA SUSP: 2.0000 | Freq: Every day | NASAL | 0 refills | Status: DC

## 2019-12-20 MED ORDER — DOXYCYCLINE HYCLATE 100 MG PO TABS
100.0000 mg | ORAL_TABLET | Freq: Two times a day (BID) | ORAL | 0 refills | Status: DC
Start: 2019-12-20 — End: 2020-01-14

## 2019-12-20 NOTE — Telephone Encounter (Signed)
Sent in

## 2019-12-20 NOTE — Telephone Encounter (Signed)
Spoke with pt relaying Dr. G's message.  Pt verbalizes understanding and expresses his thanks.  

## 2019-12-20 NOTE — Telephone Encounter (Signed)
Patient was seen at ER on 12/18/19 and was started on Prednisone. Patient was asking if this medication would help with his sinuses. The past 2 days started blowing out green mucus (it was clear in the ER), sinus pressure especially on the left side of the face, head congested, post nasal drip. No fever. Chest congestion is clearing up though. Patient sounded very ccongested in the head/nasal. Please advise. CB [phone]

## 2019-12-20 NOTE — Telephone Encounter (Signed)
Prednisone will help sinus congestion. I would also like him to use nasal saline regularly and start flonase nasal steroid sent to pharmacy.  If not improving over next 1-2 days, or worsening L sided sinus pressure, ok to fill doxycycline antibiotic sent to pharmacy.  Call us Monday for an update, schedule in office visit if not improved with this (had COVID 05/2019).

## 2019-12-23 NOTE — Telephone Encounter (Signed)
Glad to hear.  Thank you 

## 2019-12-23 NOTE — Telephone Encounter (Signed)
Patient called today to give update. He stated that he is improving and did not need the Antibiotic sent.

## 2019-12-27 ENCOUNTER — Telehealth: Payer: Self-pay | Admitting: Cardiovascular Disease

## 2019-12-27 NOTE — Telephone Encounter (Signed)
Patient wants to know Dr. Rockey Situ would recommend for pulmonology

## 2019-12-27 NOTE — Telephone Encounter (Signed)
[facility] in [place] or [place] should be fine

## 2019-12-30 ENCOUNTER — Telehealth: Payer: Self-pay | Admitting: Family Medicine

## 2019-12-30 NOTE — Telephone Encounter (Signed)
Spoke with patients wife per release form and reviewed recommendations by Dr. Rockey Situ. She verbalized understanding with no further questions at this time.

## 2019-12-30 NOTE — Progress Notes (Signed)
  Chronic Care Management   Note  12/30/2019 Name: ISHAAQ MUDGETT MRN: [id number] DOB: 14-Dec-1940  ADMIR CAPUCHINO is a 79 y.o. year old male who is a primary care patient of Ria Bush, MD. I reached out to [name] by phone today in response to a referral sent by Mr. Cadon Kiraly Crespi's PCP, Ria Bush, MD.   Mr. Arndt was given information about Chronic Care Management services today including:  1. CCM service includes personalized support from designated clinical staff supervised by his physician, including individualized plan of care and coordination with other care providers 2. 24/7 contact phone numbers for assistance for urgent and routine care needs. 3. Service will only be billed when office clinical staff spend 20 minutes or more in a month to coordinate care. 4. Only one practitioner may furnish and bill the service in a calendar month. 5. The patient may stop CCM services at any time (effective at the end of the month) by phone call to the office staff.   Patient agreed to services and verbal consent obtained.   This note is not being shared with the patient for the following reason: To respect privacy (The patient or proxy has requested that the information not be shared).  Follow up plan:   Earney [place] [employer] Scheduler

## 2020-01-02 ENCOUNTER — Encounter: Payer: Self-pay | Admitting: Family Medicine

## 2020-01-02 NOTE — Telephone Encounter (Signed)
Spoke with pt asking about his sxs.  States the cough and most sinus sxs are gone.  He taking allergy med for postnasal drip and thinks that is the fluid he feels/hears in left ear.   Explained Dr. Darnell Level is out of the office and offered visit with another provider.  However, pt prefers recommendation from Dr. Darnell Level first and will make an if Dr. Darnell Level thinks it's necessary.

## 2020-01-06 DIAGNOSIS — M5416 Radiculopathy, lumbar region: Secondary | ICD-10-CM | POA: Diagnosis not present

## 2020-01-07 ENCOUNTER — Other Ambulatory Visit: Payer: Self-pay

## 2020-01-07 ENCOUNTER — Other Ambulatory Visit (INDEPENDENT_AMBULATORY_CARE_PROVIDER_SITE_OTHER): Payer: PPO

## 2020-01-07 DIAGNOSIS — U071 COVID-19: Secondary | ICD-10-CM

## 2020-01-07 DIAGNOSIS — R7303 Prediabetes: Secondary | ICD-10-CM

## 2020-01-07 DIAGNOSIS — J1282 Pneumonia due to coronavirus disease 2019: Secondary | ICD-10-CM | POA: Diagnosis not present

## 2020-01-07 LAB — COMPREHENSIVE METABOLIC PANEL
ALT: 14 U/L (ref 0–53)
AST: 17 U/L (ref 0–37)
Albumin: 4.6 g/dL (ref 3.5–5.2)
Alkaline Phosphatase: 36 U/L — ABNORMAL LOW (ref 39–117)
BUN: 30 mg/dL — ABNORMAL HIGH (ref 6–23)
CO2: 27 mEq/L (ref 19–32)
Calcium: 10.1 mg/dL (ref 8.4–10.5)
Chloride: 106 mEq/L (ref 96–112)
Creatinine, Ser: 1.27 mg/dL (ref 0.40–1.50)
GFR: 54.74 mL/min — ABNORMAL LOW (ref >60.00)
Glucose, Bld: 157 mg/dL — ABNORMAL HIGH (ref 70–99)
Potassium: 4.6 mEq/L (ref 3.5–5.1)
Sodium: 137 mEq/L (ref 135–145)
Total Bilirubin: 0.4 mg/dL (ref 0.2–1.2)
Total Protein: 7.1 g/dL (ref 6.0–8.3)

## 2020-01-07 LAB — CBC WITH DIFFERENTIAL/PLATELET
Basophils Absolute: 0 10*3/uL (ref 0.0–0.1)
Basophils Relative: 0.1 % (ref 0.0–3.0)
Eosinophils Absolute: 0 10*3/uL (ref 0.0–0.7)
Eosinophils Relative: 0 % (ref 0.0–5.0)
HCT: 40.2 % (ref 39.0–52.0)
Hemoglobin: 13.8 g/dL (ref 13.0–17.0)
Lymphocytes Relative: 15.6 % (ref 12.0–46.0)
Lymphs Abs: 1.1 10*3/uL (ref 0.7–4.0)
MCHC: 34.2 g/dL (ref 30.0–36.0)
MCV: 95.2 fl (ref 78.0–100.0)
Monocytes Absolute: 0.3 10*3/uL (ref 0.1–1.0)
Monocytes Relative: 3.8 % (ref 3.0–12.0)
Neutro Abs: 5.8 10*3/uL (ref 1.4–7.7)
Neutrophils Relative %: 80.5 % — ABNORMAL HIGH (ref 43.0–77.0)
Platelets: 193 10*3/uL (ref 150.0–400.0)
RBC: 4.22 Mil/uL (ref 4.22–5.81)
RDW: 13.3 % (ref 11.5–15.5)
WBC: 7.2 10*3/uL (ref 4.0–10.5)

## 2020-01-07 LAB — HEMOGLOBIN A1C: Hgb A1c MFr Bld: 6.1 % (ref 4.6–6.5)

## 2020-01-14 ENCOUNTER — Ambulatory Visit (INDEPENDENT_AMBULATORY_CARE_PROVIDER_SITE_OTHER): Payer: PPO | Admitting: Family Medicine

## 2020-01-14 ENCOUNTER — Encounter: Payer: Self-pay | Admitting: Family Medicine

## 2020-01-14 ENCOUNTER — Other Ambulatory Visit: Payer: Self-pay

## 2020-01-14 VITALS — BP 120/62 | HR 58 | Temp 97.9°F | Ht 72.0 in | Wt 212.3 lb

## 2020-01-14 DIAGNOSIS — I10 Essential (primary) hypertension: Secondary | ICD-10-CM | POA: Diagnosis not present

## 2020-01-14 DIAGNOSIS — E782 Mixed hyperlipidemia: Secondary | ICD-10-CM

## 2020-01-14 DIAGNOSIS — I739 Peripheral vascular disease, unspecified: Secondary | ICD-10-CM | POA: Diagnosis not present

## 2020-01-14 DIAGNOSIS — E538 Deficiency of other specified B group vitamins: Secondary | ICD-10-CM

## 2020-01-14 DIAGNOSIS — Z1211 Encounter for screening for malignant neoplasm of colon: Secondary | ICD-10-CM

## 2020-01-14 DIAGNOSIS — Z0000 Encounter for general adult medical examination without abnormal findings: Secondary | ICD-10-CM | POA: Diagnosis not present

## 2020-01-14 DIAGNOSIS — U071 COVID-19: Secondary | ICD-10-CM | POA: Diagnosis not present

## 2020-01-14 DIAGNOSIS — M5116 Intervertebral disc disorders with radiculopathy, lumbar region: Secondary | ICD-10-CM | POA: Diagnosis not present

## 2020-01-14 DIAGNOSIS — N401 Enlarged prostate with lower urinary tract symptoms: Secondary | ICD-10-CM | POA: Diagnosis not present

## 2020-01-14 DIAGNOSIS — J432 Centrilobular emphysema: Secondary | ICD-10-CM

## 2020-01-14 DIAGNOSIS — Z7189 Other specified counseling: Secondary | ICD-10-CM

## 2020-01-14 DIAGNOSIS — J449 Chronic obstructive pulmonary disease, unspecified: Secondary | ICD-10-CM | POA: Insufficient documentation

## 2020-01-14 DIAGNOSIS — R7303 Prediabetes: Secondary | ICD-10-CM | POA: Diagnosis not present

## 2020-01-14 DIAGNOSIS — I43 Cardiomyopathy in diseases classified elsewhere: Secondary | ICD-10-CM

## 2020-01-14 DIAGNOSIS — J4489 Other specified chronic obstructive pulmonary disease: Secondary | ICD-10-CM | POA: Insufficient documentation

## 2020-01-14 DIAGNOSIS — M48062 Spinal stenosis, lumbar region with neurogenic claudication: Secondary | ICD-10-CM

## 2020-01-14 DIAGNOSIS — N138 Other obstructive and reflux uropathy: Secondary | ICD-10-CM

## 2020-01-14 MED ORDER — SPIRIVA HANDIHALER 18 MCG IN CAPS
18.0000 ug | ORAL_CAPSULE | Freq: Every day | RESPIRATORY_TRACT | 6 refills | Status: DC
Start: 2020-01-14 — End: 2020-02-25

## 2020-01-14 NOTE — Assessment & Plan Note (Signed)
Continue aspirin and statin. 

## 2020-01-14 NOTE — Assessment & Plan Note (Signed)
Continue oral b12 replacement.  

## 2020-01-14 NOTE — Assessment & Plan Note (Signed)
CTA with evidence of centrilobular emphysema. Remote smoking history (25 PY), quit 1990 Endorses intermittent ongoing exertional dyspnea, using albuterol 2-3 times a day.  Will start spiriva daily, keep pulm f/u scheduled for next month. Pt agrees with plan.

## 2020-01-14 NOTE — Assessment & Plan Note (Addendum)
Sees urology yearly. Will call for appointment.

## 2020-01-14 NOTE — Patient Instructions (Addendum)
Pass by lab to pick up stool kit. You are doing well today Continue current medicines. Add spiriva daily inhaler for lungs. Let us know how you do with this. May continue albuterol inhaler as needed.  Return as needed or in 6 months for follow up visit.   Health Maintenance After Age 79 After age 57, you are at a higher risk for certain long-term diseases and infections as well as injuries from falls. Falls are a major cause of broken bones and head injuries in people who are older than age 57. Getting regular preventive care can help to keep you healthy and well. Preventive care includes getting regular testing and making lifestyle changes as recommended by your health care provider. Talk with your health care provider about:  Which screenings and tests you should have. A screening is a test that checks for a disease when you have no symptoms.  A diet and exercise plan that is right for you. What should I know about screenings and tests to prevent falls? Screening and testing are the best ways to find a health problem early. Early diagnosis and treatment give you the best chance of managing medical conditions that are common after age 72. Certain conditions and lifestyle choices may make you more likely to have a fall. Your health care provider may recommend:  Regular vision checks. Poor vision and conditions such as cataracts can make you more likely to have a fall. If you wear glasses, make sure to get your prescription updated if your vision changes.  Medicine review. Work with your health care provider to regularly review all of the medicines you are taking, including over-the-counter medicines. Ask your health care provider about any side effects that may make you more likely to have a fall. Tell your health care provider if any medicines that you take make you feel dizzy or sleepy.  Osteoporosis screening. Osteoporosis is a condition that causes the bones to get weaker. This can make the  bones weak and cause them to break more easily.  Blood pressure screening. Blood pressure changes and medicines to control blood pressure can make you feel dizzy.  Strength and balance checks. Your health care provider may recommend certain tests to check your strength and balance while standing, walking, or changing positions.  Foot health exam. Foot pain and numbness, as well as not wearing proper footwear, can make you more likely to have a fall.  Depression screening. You may be more likely to have a fall if you have a fear of falling, feel emotionally low, or feel unable to do activities that you used to do.  Alcohol use screening. Using too much alcohol can affect your balance and may make you more likely to have a fall. What actions can I take to lower my risk of falls? General instructions  Talk with your health care provider about your risks for falling. Tell your health care provider if: ? You fall. Be sure to tell your health care provider about all falls, even ones that seem minor. ? You feel dizzy, sleepy, or off-balance.  Take over-the-counter and prescription medicines only as told by your health care provider. These include any supplements.  Eat a healthy diet and maintain a healthy weight. A healthy diet includes low-fat dairy products, low-fat (lean) meats, and fiber from whole grains, beans, and lots of fruits and vegetables. Home safety  Remove any tripping hazards, such as rugs, cords, and clutter.  Install safety equipment such as grab bars in  bathrooms and safety rails on stairs.  Keep rooms and walkways well-lit. Activity   Follow a regular exercise program to stay fit. This will help you maintain your balance. Ask your health care provider what types of exercise are appropriate for you.  If you need a cane or walker, use it as recommended by your health care provider.  Wear supportive shoes that have nonskid soles. Lifestyle  Do not drink alcohol if your  health care provider tells you not to drink.  If you drink alcohol, limit how much you have: ? 0-1 drink a day for women. ? 0-2 drinks a day for men.  Be aware of how much alcohol is in your drink. In the [place] one drink equals one typical bottle of beer (12 oz), one-half glass of wine (5 oz), or one shot of hard liquor (1 oz).  Do not use any products that contain nicotine or tobacco, such as cigarettes and e-cigarettes. If you need help quitting, ask your health care provider. Summary  Having a healthy lifestyle and getting preventive care can help to protect your health and wellness after age 43.  Screening and testing are the best way to find a health problem early and help you avoid having a fall. Early diagnosis and treatment give you the best chance for managing medical conditions that are more common for people who are older than age 40.  Falls are a major cause of broken bones and head injuries in people who are older than age 14. Take precautions to prevent a fall at home.  Work with your health care provider to learn what changes you can make to improve your health and wellness and to prevent falls. This information is not intended to replace advice given to you by your health care provider. Make sure you discuss any questions you have with your health care provider. Document Revised: 11/22/2018 Document Reviewed: 06/14/2017 Elsevier Patient Education  2020 [employer].

## 2020-01-14 NOTE — Assessment & Plan Note (Signed)
Levels remain in prediabetes range. Will change diagnosis.

## 2020-01-14 NOTE — Assessment & Plan Note (Addendum)
Followed by neurosurgery.  Recent steroid injection.  Continue gabapentin 300mg  TID>

## 2020-01-14 NOTE — Assessment & Plan Note (Addendum)
Recovered with latest EF 50%. Appreciate cards care.

## 2020-01-14 NOTE — Assessment & Plan Note (Signed)
Chronic, stable. Continue current regimen. 

## 2020-01-14 NOTE — Progress Notes (Signed)
This visit was conducted in person.  BP 120/62 (BP Location: Left Arm, Patient Position: Sitting, Cuff Size: Normal)   Pulse (!) 58   Temp 97.9 F (36.6 C) (Temporal)   Ht 6' (1.829 m)   Wt 212 lb 5 oz (96.3 kg)   SpO2 98%   BMI 28.79 kg/m    CC: CPE Subjective:    Patient ID: WISE FEES, male    DOB: November 25, 1940, 79 y.o.   MRN: [id number]  HPI: [name] is a 79 y.o. male presenting on 01/14/2020 for Annual Exam (Prt 2.)   Saw health advisor last month for medicare wellness visit. Note reviewed.    No exam data present    Clinical Support from 12/09/2019 in [facility]  PHQ-2 Total Score  0      Fall Risk  12/09/2019 12/03/2018 11/30/2017 11/27/2017 11/16/2017  Falls in the past year? 0 1 No No No  Number falls in past yr: 0 1 - - -  Comment - - - - -  Injury with Fall? 0 1 - - -  Comment - bruising only - - -  Risk for fall due to : Medication side effect - - - -  Follow up Falls evaluation completed;Falls prevention discussed - - - -   Seen last month at [facility] ER with bronchitis ?COPD exacerbation treated with neb, steroid course. CTA negative for PE. Also had negative LE venous US. He did improve with this treatment, however then developed persistent sinus congestion with L hearing loss s/p doxycycline course. Thought serous otitis - this has improved.   H/o COVID PNA 05/2019 s/p prolonged recovery.   Recent CTA showed evidence of emphysema as well as coronary calcifications and aortic ATH. He has been referred to [facility] - appt 02/2020. Endorses some exertional dyspnea. Ex smoker 25 PY hx, quit 1990. Also worked in the [employer], [employer].   He had spine injection the day he had labs done.   Preventative: Colon cancer screening - colonoscopy 03/2010 HP polyp, diverticulosis, rpt 10 yrs ([name]). Agrees to iFOB.  Prostate cancer screening - sees Dr Broadus John  Lung cancer screening - not eligible  Flu shot - yearly  Td 2007    Pneumovax 10/2008, prevnar 01/2015 COVID vaccine - completed pfizer course 09/2019  [email or web address]. shingrix - 2018 completed series Advanced directive: received and scanned 05/2019. HCPOA is wife [name] then [name]. Does not want prolonged life support if terminal condition.  Seat belt use discussed  Sunscreen use discussed. No changing moles on skin. Sees derm Nehemiah Massed).  Ex smoker - quit 1990 Alcohol - none  Bowel - no constipation  Bladder - some incontinence - managed on detrol LA. Takes lasix Q3 days   Married, wife [name], for 25 years in 2016. 1 daughter and 3 grandkids from first marriage.  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 Mows yard.  Diet: good water, fruits/vegetables daily     Relevant past medical, surgical, family and social history reviewed and updated as indicated. Interim medical history since our last visit reviewed. Allergies and medications reviewed and updated. Outpatient Medications Prior to Visit  Medication Sig Dispense Refill  . acetaminophen (TYLENOL) 650 MG CR tablet Take 1,300 mg by mouth every 8 (eight) hours as needed for pain.    Marland Kitchen albuterol (VENTOLIN HFA) 108 (90 Base) MCG/ACT inhaler Inhale 2 puffs into the lungs every  6 (six) hours as needed for wheezing or shortness of breath. 18 g 3  . aspirin EC 81 MG EC tablet Take 1 tablet (81 mg total) by mouth daily. 30 tablet 0  . benzonatate (TESSALON PERLES) 100 MG capsule Take 1 capsule (100 mg total) by mouth every 6 (six) hours as needed for cough. 16 capsule 0  . Cyanocobalamin (B-12) 1000 MCG SUBL Place 1 tablet under the tongue daily. 30 each   . ezetimibe (ZETIA) 10 MG tablet Take 1 tablet (10 mg total) by mouth daily. 90 tablet 3  . fluticasone (FLONASE) 50 MCG/ACT nasal spray Place 2 sprays into both nostrils daily. 16 g 0  . furosemide (LASIX) 20 MG tablet Take 1 tablet (20 mg total) by mouth daily as needed. 30  tablet 3  . gabapentin (NEURONTIN) 300 MG capsule Take 1 capsule (300 mg total) by mouth 3 (three) times daily. 270 capsule 1  . isosorbide mononitrate (IMDUR) 30 MG 24 hr tablet Take 1 tablet (30 mg total) by mouth at bedtime. 90 tablet 3  . ketoconazole (NIZORAL) 2 % cream Apply 1 application topically daily as needed for irritation.     . losartan (COZAAR) 25 MG tablet Take 1 tablet (25 mg total) by mouth daily. 90 tablet 3  . metoprolol succinate (TOPROL XL) 25 MG 24 hr tablet Take 0.5 tablets (12.5 mg total) by mouth daily. 45 tablet 3  . nitroGLYCERIN (NITROSTAT) 0.4 MG SL tablet Place 1 tablet (0.4 mg total) under the tongue every 5 (five) minutes as needed for chest pain. 25 tablet 3  . polyethylene glycol (MIRALAX / GLYCOLAX) 17 g packet Take 17 g by mouth every other day.     . ranolazine (RANEXA) 500 MG 12 hr tablet TAKE ONE TABLET TWICE DAILY 180 tablet 2  . simvastatin (ZOCOR) 40 MG tablet Take 1 tablet (40 mg total) by mouth daily. 90 tablet 3  . tolterodine (DETROL LA) 4 MG 24 hr capsule Take 4 mg by mouth daily.    . doxycycline (VIBRA-TABS) 100 MG tablet Take 1 tablet (100 mg total) by mouth 2 (two) times daily. 14 tablet 0   No facility-administered medications prior to visit.     Per HPI unless specifically indicated in ROS section below Review of Systems  Constitutional: Negative for activity change, appetite change, chills, fatigue, fever and unexpected weight change.  HENT: Negative for hearing loss.   Eyes: Negative for visual disturbance.  Respiratory: Positive for cough, chest tightness and shortness of breath. Negative for wheezing.   Cardiovascular: Negative for chest pain, palpitations and leg swelling.  Gastrointestinal: Negative for abdominal distention, abdominal pain, blood in stool, constipation, diarrhea, nausea and vomiting.  Genitourinary: Negative for difficulty urinating and hematuria.  Musculoskeletal: Negative for arthralgias, myalgias and neck pain.    Skin: Negative for rash.  Neurological: Positive for numbness. Negative for dizziness, seizures, syncope and headaches.       Paresthesias worse at night time - takes gabapentin TID  Hematological: Negative for adenopathy. Bruises/bleeds easily.  Psychiatric/Behavioral: Negative for dysphoric mood. The patient is not nervous/anxious.    Objective:  BP 120/62 (BP Location: Left Arm, Patient Position: Sitting, Cuff Size: Normal)   Pulse (!) 58   Temp 97.9 F (36.6 C) (Temporal)   Ht 6' (1.829 m)   Wt 212 lb 5 oz (96.3 kg)   SpO2 98%   BMI 28.79 kg/m   Wt Readings from Last 3 Encounters:  01/14/20 212 lb 5 oz (96.3   kg)  12/18/19 209 lb (94.8 kg)  12/09/19 208 lb (94.3 kg)      Physical Exam Vitals and nursing note reviewed.  Constitutional:      General: He is not in acute distress.    Appearance: Normal appearance. He is well-developed. He is not ill-appearing.  HENT:     Head: Normocephalic and atraumatic.     Right Ear: Hearing, tympanic membrane, ear canal and external ear normal.     Left Ear: Hearing, tympanic membrane, ear canal and external ear normal.     Mouth/Throat:     Pharynx: Uvula midline.  Eyes:     General: No scleral icterus.    Extraocular Movements: Extraocular movements intact.     Conjunctiva/sclera: Conjunctivae normal.     Pupils: Pupils are equal, round, and reactive to light.  Neck:     Thyroid: No thyromegaly or thyroid tenderness.     Vascular: No carotid bruit.  Cardiovascular:     Rate and Rhythm: Normal rate and regular rhythm.     Pulses: Normal pulses.          Radial pulses are 2+ on the right side and 2+ on the left side.     Heart sounds: Normal heart sounds. No murmur.  Pulmonary:     Effort: Pulmonary effort is normal. No respiratory distress.     Breath sounds: Normal breath sounds. No wheezing, rhonchi or rales.  Abdominal:     General: Abdomen is flat. Bowel sounds are normal. There is no distension.     Palpations: Abdomen  is soft. There is no mass.     Tenderness: There is no abdominal tenderness. There is no guarding or rebound.     Hernia: No hernia is present.  Musculoskeletal:        General: Normal range of motion.     Cervical back: Normal range of motion and neck supple.     Right lower leg: No edema.     Left lower leg: No edema.  Lymphadenopathy:     Cervical: No cervical adenopathy.  Skin:    General: Skin is warm and dry.     Findings: No rash.  Neurological:     General: No focal deficit present.     Mental Status: He is alert and oriented to person, place, and time.     Comments: CN grossly intact, station and gait intact  Psychiatric:        Mood and Affect: Mood normal.        Behavior: Behavior normal.        Thought Content: Thought content normal.        Judgment: Judgment normal.       Results for orders placed or performed in visit on 01/07/20  Comprehensive metabolic panel  Result Value Ref Range   Sodium 137 135 - 145 mEq/L   Potassium 4.6 3.5 - 5.1 mEq/L   Chloride 106 96 - 112 mEq/L   CO2 27 19 - 32 mEq/L   Glucose, Bld 157 (H) 70 - 99 mg/dL   BUN 30 (H) 6 - 23 mg/dL   Creatinine, Ser 1.27 0.40 - 1.50 mg/dL   Total Bilirubin 0.4 0.2 - 1.2 mg/dL   Alkaline Phosphatase 36 (L) 39 - 117 U/L   AST 17 0 - 37 U/L   ALT 14 0 - 53 U/L   Total Protein 7.1 6.0 - 8.3 g/dL   Albumin 4.6 3.5 - 5.2 g/dL   GFR 54.74 (  L) >60.00 mL/min   Calcium 10.1 8.4 - 10.5 mg/dL  CBC with Differential/Platelet  Result Value Ref Range   WBC 7.2 4.0 - 10.5 K/uL   RBC 4.22 4.22 - 5.81 Mil/uL   Hemoglobin 13.8 13.0 - 17.0 g/dL   HCT 40.2 39.0 - 52.0 %   MCV 95.2 78.0 - 100.0 fl   MCHC 34.2 30.0 - 36.0 g/dL   RDW 13.3 11.5 - 15.5 %   Platelets 193.0 150.0 - 400.0 K/uL   Neutrophils Relative % 80.5 (H) 43.0 - 77.0 %   Lymphocytes Relative 15.6 12.0 - 46.0 %   Monocytes Relative 3.8 3.0 - 12.0 %   Eosinophils Relative 0.0 0.0 - 5.0 %   Basophils Relative 0.1 0.0 - 3.0 %   Neutro Abs 5.8  1.4 - 7.7 K/uL   Lymphs Abs 1.1 0.7 - 4.0 K/uL   Monocytes Absolute 0.3 0.1 - 1.0 K/uL   Eosinophils Absolute 0.0 0.0 - 0.7 K/uL   Basophils Absolute 0.0 0.0 - 0.1 K/uL  Hemoglobin A1c  Result Value Ref Range   Hgb A1c MFr Bld 6.1 4.6 - 6.5 %   Lab Results  Component Value Date   CHOL 109 06/04/2019   HDL 33 (L) 06/04/2019   LDLCALC 62 06/04/2019   LDLDIRECT 106.0 01/01/2016   TRIG 78 06/18/2019   CHOLHDL 3.3 06/04/2019    Lab Results  Component Value Date   PSA 0.51 12/04/2018   PSA 0.21 11/30/2017   PSA 0.55 11/29/2016   Assessment & Plan:  This visit occurred during the SARS-CoV-2 public health emergency.  Safety protocols were in place, including screening questions prior to the visit, additional usage of staff PPE, and extensive cleaning of exam room while observing appropriate contact time as indicated for disinfecting solutions.   Problem List Items Addressed This Visit    PVD (peripheral vascular disease) with claudication (HCC)    Continue aspirin and statin.      Prediabetes    Levels remain in prediabetes range. Will change diagnosis.       Lumbar stenosis with neurogenic claudication   Lumbar disc disease with radiculopathy    Followed by neurosurgery.  Recent steroid injection.  Continue gabapentin 300mg TID>       Low vitamin B12 level    Continue oral b12 replacement.       Hyperlipidemia    FLP last checked 05/2019. Continue simvastatin. The ASCVD Risk score (Goff DC Jr., et al., 2013) failed to calculate for the following reasons:   The patient has a prior MI or stroke diagnosis       Health maintenance examination - Primary    Preventative protocols reviewed and updated unless pt declined. Discussed healthy diet and lifestyle.       Essential hypertension    Chronic, stable. Continue current regimen.       COPD (chronic obstructive pulmonary disease) (HCC)    CTA with evidence of centrilobular emphysema. Remote smoking history (25 PY),  quit 1990 Endorses intermittent ongoing exertional dyspnea, using albuterol 2-3 times a day.  Will start spiriva daily, keep pulm f/u scheduled for next month. Pt agrees with plan.       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 Appreciate cards care.       Benign prostatic hyperplasia with urinary obstruction    Sees urology yearly. Will call for   appointment.       Advanced care planning/counseling discussion    Advanced directive: received and scanned 05/2019. HCPOA is wife Nancy then Julie Murr. Does not want prolonged life support if terminal condition.        Other Visit Diagnoses    Special screening for malignant neoplasms, colon       Relevant Orders   Fecal occult blood, imunochemical       Meds ordered this encounter  Medications  . tiotropium (SPIRIVA HANDIHALER) 18 MCG inhalation capsule    Sig: Place 1 capsule (18 mcg total) into inhaler and inhale daily.    Dispense:  30 capsule    Refill:  6   Orders Placed This Encounter  Procedures  . Fecal occult blood, imunochemical    Standing Status:   Future    Standing Expiration Date:   01/13/2021    Patient instructions: Pass by lab to pick up stool kit. You are doing well today Continue current medicines. Add spiriva daily inhaler for lungs. Let us know how you do with this. May continue albuterol inhaler as needed.  Return as needed or in 6 months for follow up visit.   Follow up plan: Return in about 6 months (around 07/15/2020) for follow up visit.  Javier Gutierrez, MD   

## 2020-01-14 NOTE — Assessment & Plan Note (Signed)
Preventative protocols reviewed and updated unless pt declined. Discussed healthy diet and lifestyle.  

## 2020-01-14 NOTE — Assessment & Plan Note (Signed)
Advanced directive: received and scanned 05/2019. HCPOA is wife [name] then [name]. Does not want prolonged life support if terminal condition.

## 2020-01-14 NOTE — Assessment & Plan Note (Signed)
FLP last checked 05/2019. Continue simvastatin. The ASCVD Risk score Mikey Bussing DC Jr., et al., 2013) failed to calculate for the following reasons:   The patient has a prior MI or stroke diagnosis

## 2020-01-16 ENCOUNTER — Encounter: Payer: Self-pay | Admitting: Family Medicine

## 2020-01-16 ENCOUNTER — Other Ambulatory Visit (INDEPENDENT_AMBULATORY_CARE_PROVIDER_SITE_OTHER): Payer: PPO

## 2020-01-16 DIAGNOSIS — Z1211 Encounter for screening for malignant neoplasm of colon: Secondary | ICD-10-CM

## 2020-01-16 LAB — FECAL OCCULT BLOOD, IMMUNOCHEMICAL: Fecal Occult Bld: NEGATIVE

## 2020-01-16 LAB — FECAL OCCULT BLOOD, GUAIAC: Fecal Occult Blood: NEGATIVE

## 2020-01-28 DIAGNOSIS — N401 Enlarged prostate with lower urinary tract symptoms: Secondary | ICD-10-CM | POA: Diagnosis not present

## 2020-01-28 DIAGNOSIS — R948 Abnormal results of function studies of other organs and systems: Secondary | ICD-10-CM | POA: Diagnosis not present

## 2020-01-28 DIAGNOSIS — R3914 Feeling of incomplete bladder emptying: Secondary | ICD-10-CM | POA: Diagnosis not present

## 2020-01-29 DIAGNOSIS — M7138 Other bursal cyst, other site: Secondary | ICD-10-CM | POA: Diagnosis not present

## 2020-01-29 DIAGNOSIS — M48061 Spinal stenosis, lumbar region without neurogenic claudication: Secondary | ICD-10-CM | POA: Diagnosis not present

## 2020-01-29 DIAGNOSIS — M961 Postlaminectomy syndrome, not elsewhere classified: Secondary | ICD-10-CM | POA: Diagnosis not present

## 2020-01-30 DIAGNOSIS — M25551 Pain in right hip: Secondary | ICD-10-CM | POA: Diagnosis not present

## 2020-02-14 ENCOUNTER — Telehealth: Payer: PPO

## 2020-02-14 NOTE — Chronic Care Management (AMB) (Signed)
Chronic Care Management Pharmacy  Name: ARGIE APPLEGATE  MRN: [id number] DOB: 1941/03/19  Chief Complaint/ HPI  [name],  79 y.o. , male presents for their Initial CCM visit with the clinical pharmacist via telephone.  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 Referred to pulmonary, appt 07/21. Using albuterol 2-3 times per day, start Spiriva.  Consult Visit:  12/18/19: ED - bronchitis  11/28/19: Cardiology - continue current meds, rtc 6 months  COVID-19 05/20/19, hospitalized for 20 days   Medications: Outpatient Encounter Medications as of 02/24/2020  Medication Sig  . acetaminophen (TYLENOL) 650 MG CR tablet Take 1,300 mg by mouth every 8 (eight) hours as needed for pain.  Marland Kitchen albuterol (VENTOLIN HFA) 108 (90 Base) MCG/ACT inhaler Inhale 2 puffs into the lungs every 6 (six) hours as needed for wheezing or shortness of breath.  Marland Kitchen aspirin EC 81 MG EC tablet Take 1 tablet (81 mg total) by mouth daily.  . Cyanocobalamin (B-12) 1000 MCG SUBL Place 1 tablet under the tongue daily.  Marland Kitchen ezetimibe (ZETIA) 10 MG tablet Take 1 tablet (10 mg total) by mouth daily.  . furosemide (LASIX) 20 MG tablet Take 1 tablet (20 mg total) by mouth daily as needed.  . gabapentin (NEURONTIN) 300 MG capsule Take 1 capsule (300 mg total) by mouth 3 (three) times daily.  . isosorbide mononitrate (IMDUR) 30 MG 24 hr tablet Take 1 tablet (30 mg total) by mouth at bedtime.  Marland Kitchen ketoconazole (NIZORAL) 2 % cream Apply 1 application topically daily as needed for irritation.   Marland Kitchen losartan (COZAAR) 25 MG tablet Take 1 tablet (25 mg total) by mouth daily.  . metoprolol succinate (TOPROL XL) 25 MG 24 hr tablet Take 0.5 tablets (12.5 mg total) by  mouth daily.  . nitroGLYCERIN (NITROSTAT) 0.4 MG SL tablet Place 1 tablet (0.4 mg total) under the tongue every 5 (five) minutes as needed for chest pain.  . polyethylene glycol (MIRALAX / GLYCOLAX) 17 g packet Take 17 g by mouth every other day.   . ranolazine (RANEXA) 500 MG 12 hr tablet TAKE ONE TABLET TWICE DAILY  . simvastatin (ZOCOR) 40 MG tablet Take 1 tablet (40 mg total) by mouth daily.  Marland Kitchen tolterodine (DETROL LA) 4 MG 24 hr capsule Take 4 mg by mouth daily.  . benzonatate (TESSALON PERLES) 100 MG capsule Take 1 capsule (100 mg total) by mouth every 6 (six) hours as needed for cough. (Patient not taking: Reported on 02/24/2020)  . fluticasone (FLONASE) 50 MCG/ACT nasal spray Place 2 sprays into both nostrils daily. (Patient not taking: Reported on 02/24/2020)  . tiotropium (SPIRIVA HANDIHALER) 18 MCG inhalation capsule Place 1 capsule (18 mcg total) into inhaler and inhale daily. (Patient not taking: Reported on 02/24/2020)   No facility-administered encounter medications on file as of 02/24/2020.   Current Diagnosis/Assessment:   Emergency planning/management officer Strain: Low Risk   . Difficulty of Paying Living Expenses: Not very hard   Goals Addressed            This Visit's Progress   . Pharmacy Care Plan       CARE PLAN ENTRY (see longitudinal plan of care for additional care plan information)  Current Barriers:  . Chronic Disease Management support,  education, and care coordination needs related to Hypertension and Cardiomyopathy   Hypertension BP Readings from Last 3 Encounters:  01/14/20 120/62  12/18/19 (!) 144/61  12/09/19 126/64 .  Pharmacist Clinical Goal(s): o Over the next 6 months, patient will work with PharmD and providers to maintain BP goal <140/90 mmHg . Current regimen:   Losartan 25 mg - 1 tablet daily  Metoprolol succinate 25 mg - 1/2 tablet daily . Interventions: o Reviewed home blood pressure readings o Comprehensive medication review . 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 Pharmacist Clinical Goal(s) o Over the next 6 months, patient will work with PharmD and providers to prevent fluid overload and shortness of breath . Current regimen:   Metoprolol succinate 25 mg - 1/2 tablet daily  Furosemide 20 mg - 1 tablet daily as needed . Interventions: o Discussed daily weight monitoring  . Patient self care activities - Over the next 6 months, patient will: o Check weight daily in the morning before breakfast. Take Lasix if weight gain of 3 or more pounds in 24 hours or 5 pounds in 1 week.   Initial goal documentation      Hypertension   CMP Latest Ref Rng & Units 01/07/2020 12/18/2019 09/10/2019  Glucose 70 - 99 mg/dL 157(H) 103(H) 124(H)  BUN 6 - 23 mg/dL 30(H) 26(H) 29(H)  Creatinine 0.40 - 1.50 mg/dL 1.27 1.36(H) 1.44(H)  Sodium 135 - 145 mEq/L 137 139 139  Potassium 3.5 - 5.1 mEq/L 4.6 4.3 4.7  Chloride 96 - 112 mEq/L 106 106 102  CO2 19 - 32 mEq/L 27 26 22   Calcium 8.4 - 10.5 mg/dL 10.1 8.9 9.1  Total Protein 6.0 - 8.3 g/dL 7.1 - 6.4  Total Bilirubin 0.2 - 1.2 mg/dL 0.4 - 0.3  Alkaline Phos 39 - 117 U/L 36(L) - 35(L)  AST 0 - 37 U/L 17 - 16  ALT 0 - 53 U/L 14 - 15   Office blood pressures are: BP Readings from Last 3 Encounters:  01/14/20 120/62  12/18/19 (!) 144/61  12/09/19 126/64   Patient has failed these meds in the past: none reported Patient checks BP at home twice daily Patient home BP readings are ranging: last three days 132/66, 137/70, 124/57  BP goal < 140/90 mmHg Patient is currently controlled on the following medications:   Losartan 25 mg - 1 tablet daily  Metoprolol succinate 25 mg - 1/2 tablet daily  We discussed: reports both medications were started since he had COVID-19  in October 2020, closely managed by Dr. Rockey Situ, denies adverse effects  Plan: Continue current medications   Cardiomyopathy (due to COVID-19)   Most recent EF: 50% Patient has failed these meds in past: none Patient is currently controlled on the following medications:   Metoprolol succinate 25 mg - 1/2 tablet daily  Furosemide 20 mg - 1 tablet daily PRN  We discussed: Pt is not currently weighing daily. Takes Lasix if swelling and has not taken any in the past week. Encouraged daily weighing to assess for 3 lb weight gain in 24 hours or 5 lb in 7 days.   Plan: Continue current medications   Hyperlipidemia/CAD   Lipid Panel     Component Value Date/Time   CHOL 109 06/04/2019  0540   TRIG 78 06/18/2019 2150   HDL 33 (L) 06/04/2019 0540   LDLCALC 62 06/04/2019 0540   LDLDIRECT 106.0 01/01/2016 0844     CBC Latest Ref Rng & Units 01/07/2020 12/18/2019 06/25/2019  WBC 4.0 - 10.5 K/uL 7.2 6.7 6.0  Hemoglobin 13.0 - 17.0 g/dL 13.8 13.9 11.1(L)  Hematocrit 39 - 52 % 40.2 40.6 33.2(L)  Platelets 150 - 400 K/uL 193.0 167 293   The ASCVD Risk score Mikey Bussing DC Jr., et al., 2013) failed to calculate for the following reasons:   The patient has a prior MI or stroke diagnosis   LDL goal < 70 Patient has failed these meds in past: none  Patient is currently controlled on the following medications:  . Simvastatin 40 mg - 1 tablet daily . Ezetimibe 10 mg - 1 tablet daily  CAD:   Aspirin 81 mg - 1 tablet daily  Ranolazine 500 mg - 1 tablet BID  Nitroglycerin 0.4 mg SL - PRN  Isosorbide mononitrate 30 mg - 1 tablet daily at bedtime  We discussed: denies adverse effects, denies chest pain, CBC stable; confirms adherence to all of the above; hasn't taken nitroglycerin in past year  Plan: Continue current medications  COPD / Tobacco   Tobacco Status:  Social History   Tobacco Use  Smoking Status Former Smoker  . Packs/day: 1.00  . Years: 25.00  . Pack years: 25.00  . Types:  Cigarettes  . Quit date: 08/15/1988  . Years since quitting: 31.5  Smokeless Tobacco Never Used   Reports initial appt with pulmonology tomorrow 7/13, diagnosed during ED visit 12/18/19 Per PCP note, CTA showed evidence of emphysema, endorses some exertional dyspnea, previously worked at [employer], 25 year pack history quit 1990   Patient has failed these meds in past: none  Patient is currently uncontrolled on the following medications:   Spiriva 18 mcg - 1 capsule by inhalation daily  Albuterol 90 mcg - 2 puffs q6h PRN  Using maintenance inhaler regularly? No Frequency of rescue inhaler use:  1-2x per week (every 3-4 days)  We discussed: started on Spiriva 6/1 per PCP, pt stopped Spiriva in the past 3-4 days due to BP elevations (reports SBP 150-160s), now back down to 120s-130s. Also denies change in symptoms with Spiriva. Encouraged discussing with pulmonology at consult tomorrow.   Plan: Continue current medications   Pre-Diabetes   Recent Relevant Labs: Lab Results  Component Value Date/Time   HGBA1C 6.1 01/07/2020 09:06 AM   HGBA1C 5.7 (A) 09/25/2019 12:06 PM   HGBA1C 7.4 (H) 06/19/2019 01:45 PM   MICROALBUR <0.7 12/04/2018 11:33 AM   MICROALBUR <0.7 11/30/2017 10:35 AM    Checking BG: infrequently, previously checked regularly but always within range   Patient is currently controlled on the following medications:   No pharmacotherapy  We discussed: BG primarily elevated during hospital stay for [place] 05/2019 receiving steroids   Plan: Continue current medications   BPH   Patient has failed these meds in past: none reported Patient is currently controlled on the following medications:   Tolterodine 24 hr 4 mg - 1 capsule daily  Symptoms: reports good control on current therapy, urinating 5-7 times during the day, denies nocturnal disturbance, reports its very dependent on how much fluid he drinks during the day and when he takes Lasix, reports taking  Tolterodine for 2-3 years  We discussed: potential anticholinergic side effects to watch for, medication on [place] list for adults > 65  Plan: Continue current medications   Vitamin B12 Deficiency   Last B12 WNL 11/2018 Patient has failed these meds in past: none reported Patient is currently controlled on the following medications:   Vitamin B12 1000 mcg SL - 1 tablet daily  We discussed: confirmed adherence   Plan: Continue current medications   Lumbar stenosis/disc disease   Patient has failed these meds in past: none reported  Patient is currently controlled on the following medications:   Gabapentin 300 mg - 1 capsule TID  Steroid injections - report last injection was for back pain ~ 1 month ago   We discussed: followed by neurosurgery, reports noticeable improvement with gabapentin  Plan: Continue current medications  Misc.   Patient is currently on the following misc/OTC medications:   Ketoconazle 2% cream (reports use PRN for rash on face)  MiraLAX PRN constipation (reports use less than once weekly)   Tylenol Arthritis 650 mg - 2 tablets daily at bedtime  (reports taking 2 tabs every night for leg pain)  Medications reviewed, appropriate to continue   Plan: Continue current medications  Medication Management  Pharmacy: [facility]  Adherence: Refill history reviewed, < 5 day gap for refills  Affordability: Denies cost concerns, reports Spiriva $45/month but able to afford at this time  CCM Follow Up: 6 months, telephone   Debbora Dus, PharmD Clinical Pharmacist [facility] [phone]

## 2020-02-14 NOTE — Chronic Care Management (AMB) (Deleted)
Chronic Care Management Pharmacy  Name: [name]  MRN: [id number] DOB: [birth year]  Chief Complaint/ HPI  [name],  79 y.o. , male presents for their {Initial/Follow-up:3041532} CCM visit with the clinical pharmacist {CHL HP Upstream Pharm visit MBBU:[id number]}.  PCP : Ria Bush, MD  Their chronic conditions include: hypertension, CAD, PVD, COPD, GERD, osteoarthritis, BPH, HLD, pre-diabetes, low B12  Office Visits:   01/14/20: PCP visit - Seen last month at [facility] ER with bronchitis/COPD exacerbation treated with neb, steroid course. Referred to pulmonary, appt 07/21. Using albuterol 2-3 times per day, start Spiriva.  Consult Visit:  12/18/19: ED - bronchitis  11/28/19: Cardiology - continue current meds, rtc 6 months  Medications: Outpatient Encounter Medications as of 02/14/2020  Medication Sig  . acetaminophen (TYLENOL) 650 MG CR tablet Take 1,300 mg by mouth every 8 (eight) hours as needed for pain.  Marland Kitchen albuterol (VENTOLIN HFA) 108 (90 Base) MCG/ACT inhaler Inhale 2 puffs into the lungs every 6 (six) hours as needed for wheezing or shortness of breath.  Marland Kitchen aspirin EC 81 MG EC tablet Take 1 tablet (81 mg total) by mouth daily.  . benzonatate (TESSALON PERLES) 100 MG capsule Take 1 capsule (100 mg total) by mouth every 6 (six) hours as needed for cough.  . Cyanocobalamin (B-12) 1000 MCG SUBL Place 1 tablet under the tongue daily.  Marland Kitchen ezetimibe (ZETIA) 10 MG tablet Take 1 tablet (10 mg total) by mouth daily.  . fluticasone (FLONASE) 50 MCG/ACT nasal spray Place 2 sprays into both nostrils daily.  . furosemide (LASIX) 20 MG tablet Take 1 tablet (20 mg total) by mouth daily as needed.  . gabapentin (NEURONTIN) 300 MG capsule Take 1 capsule (300 mg total) by mouth 3 (three) times daily.  . isosorbide mononitrate (IMDUR) 30 MG 24 hr tablet Take 1 tablet (30 mg total) by mouth at bedtime.  Marland Kitchen ketoconazole (NIZORAL) 2 % cream Apply 1 application topically daily as  needed for irritation.   Marland Kitchen losartan (COZAAR) 25 MG tablet Take 1 tablet (25 mg total) by mouth daily.  . metoprolol succinate (TOPROL XL) 25 MG 24 hr tablet Take 0.5 tablets (12.5 mg total) by mouth daily.  . nitroGLYCERIN (NITROSTAT) 0.4 MG SL tablet Place 1 tablet (0.4 mg total) under the tongue every 5 (five) minutes as needed for chest pain.  . polyethylene glycol (MIRALAX / GLYCOLAX) 17 g packet Take 17 g by mouth every other day.   . ranolazine (RANEXA) 500 MG 12 hr tablet TAKE ONE TABLET TWICE DAILY  . simvastatin (ZOCOR) 40 MG tablet Take 1 tablet (40 mg total) by mouth daily.  Marland Kitchen tiotropium (SPIRIVA HANDIHALER) 18 MCG inhalation capsule Place 1 capsule (18 mcg total) into inhaler and inhale daily.  Marland Kitchen tolterodine (DETROL LA) 4 MG 24 hr capsule Take 4 mg by mouth daily.   No facility-administered encounter medications on file as of 02/14/2020.     Current Diagnosis/Assessment:  Goals Addressed   None    Hypertension   CMP Latest Ref Rng & Units 01/07/2020 12/18/2019 09/10/2019  Glucose 70 - 99 mg/dL 157(H) 103(H) 124(H)  BUN 6 - 23 mg/dL 30(H) 26(H) 29(H)  Creatinine 0.40 - 1.50 mg/dL 1.27 1.36(H) 1.44(H)  Sodium 135 - 145 mEq/L 137 139 139  Potassium 3.5 - 5.1 mEq/L 4.6 4.3 4.7  Chloride 96 - 112 mEq/L 106 106 102  CO2 19 - 32 mEq/L _0 Calcium 8.4 - 10.5 mg/dL 10.1 8.9 9.1  Total  Protein 6.0 - 8.3 g/dL 7.1 - 6.4  Total Bilirubin 0.2 - 1.2 mg/dL 0.4 - 0.3  Alkaline Phos 39 - 117 U/L 36(L) - 35(L)  AST 0 - 37 U/L 17 - 16  ALT 0 - 53 U/L 14 - 15   Office blood pressures are: BP Readings from Last 3 Encounters:  01/14/20 120/62  12/18/19 (!) 144/61  12/09/19 126/64   BP today is:  {CHL HP UPSTREAM Pharmacist BP ranges:[phone]}  Patient has failed these meds in the past:  Patient checks BP at home {CHL HP BP Monitoring Frequency:[phone]} Patient home BP readings are ranging:   Patient is currently {CHL Controlled/Uncontrolled:[phone]} on the following  medications:   Losartan 25 mg - 1 tablet daily  Metoprolol succinate 25 mg - 1/2 tablet daily  We discussed:  {CHL HP Upstream Pharmacy discussion:[phone]}  Plan: Continue {CHL HP Upstream Pharmacy [phone]}  Hyperlipidemia/CAD   LDL goal < 70  Lipid Panel     Component Value Date/Time   CHOL 109 06/04/2019 0540   TRIG 78 06/18/2019 2150   HDL 33 (L) 06/04/2019 0540   LDLCALC 62 06/04/2019 0540   LDLDIRECT 106.0 01/01/2016 0844    Hepatic Function Latest Ref Rng & Units 01/07/2020 09/10/2019 06/23/2019  Total Protein 6.0 - 8.3 g/dL 7.1 6.4 5.8(L)  Albumin 3.5 - 5.2 g/dL 4.6 4.3 2.6(L)  AST 0 - 37 U/L 17 16 39  ALT 0 - 53 U/L 14 15 71(H)  Alk Phosphatase 39 - 117 U/L 36(L) 35(L) 44  Total Bilirubin 0.2 - 1.2 mg/dL 0.4 0.3 0.5  Bilirubin, Direct 0.0 - 0.3 mg/dL - - -     The ASCVD Risk score Mikey Bussing DC Jr., et al., 2013) failed to calculate for the following reasons:   The patient has a prior MI or stroke diagnosis   Patient has failed these meds in past: *** Patient is currently {CHL Controlled/Uncontrolled:[phone]} on the following medications:  . Simvastatin 40 mg - 1 tablet daily . Ezetimibe 10 mg - 1 tablet daily  CAD:   Aspirin 81 mg - 1 tablet daily  Ranolazine 500 mg - 1 tablet BID  Nitroglycerin 0.4 mg SL - PRN  Isosorbide mononitrate 30 mg - 1 tablet daily at bedtime  We discussed:  {CHL HP Upstream Pharmacy discussion:[phone]}  Plan: Continue {CHL HP Upstream Pharmacy [phone]}  COPD / Asthma / Tobacco   Last spirometry score: ***  Gold Grade: {CHL HP Upstream Pharm COPD Gold UEKCM:[id number]} Current COPD Classification:  {CHL HP Upstream Pharm COPD Classification:[phone]}  Eosinophil count:   Lab Results  Component Value Date/Time   EOSPCT 0.0 01/07/2020 09:06 AM  %                               Eos (Absolute):  Lab Results  Component Value Date/Time   EOSABS 0.0 01/07/2020 09:06 AM   EOSABS 0.3 08/31/2018 10:46  AM   Tobacco Status:  Social History   Tobacco Use  Smoking Status Former Smoker  . Packs/day: 1.00  . Years: 25.00  . Pack years: 25.00  . Types: Cigarettes  . Quit date: 08/15/1988  . Years since quitting: 31.5  Smokeless Tobacco Never Used   Patient has failed these meds in past: *** Patient is currently {CHL Controlled/Uncontrolled:[phone]} on the following medications:   Spiriva 18 mcg - 1 capsule by inhalation daily  Albuterol 90 mcg - 2 puffs q6h PRN  Using  maintenance inhaler regularly? {yes/no:20286} Frequency of rescue inhaler use:  {CHL HP Upstream Pharm Inhaler LTKC:3017209106}  We discussed:  {CHL HP Upstream Pharmacy discussion:[id number]}  Plan: Continue {CHL HP Upstream Pharmacy [phone]}   Pre-Diabetes   Recent Relevant Labs: Lab Results  Component Value Date/Time   HGBA1C 6.1 01/07/2020 09:06 AM   HGBA1C 5.7 (A) 09/25/2019 12:06 PM   HGBA1C 7.4 (H) 06/19/2019 01:45 PM   MICROALBUR <0.7 12/04/2018 11:33 AM   MICROALBUR <0.7 11/30/2017 10:35 AM    Checking BG: {CHL HP Blood Glucose Monitoring Frequency:[phone]}  Patient is currently {CHL Controlled/Uncontrolled:8653240959} on the following medications:   No pharmacotherapy  We discussed: {CHL HP Upstream Pharmacy discussion:[id number]}  Plan: Continue {CHL HP Upstream Pharmacy [phone]}   BPH   Patient has failed these meds in past:  Patient is currently {CHL Controlled/Uncontrolled:8653240959} on the following medications:   Tolterodine 24 hr 4 mg - 1 capsule daily  We discussed:   Plan: Continue {CHL HP Upstream Pharmacy [phone]}   Vitamin B12 Deficiency   Patient has failed these meds in past:  Patient is currently {CHL Controlled/Uncontrolled:8653240959} on the following medications:   Vitamin B12 1000 mcg SL - 1 tablet daily  We discussed:   Plan: Continue {CHL HP Upstream Pharmacy [phone]}   Lumbar stenosis/disc disease   Patient has  failed these meds in past:  Patient is currently {CHL Controlled/Uncontrolled:8653240959} on the following medications:   Gabapentin 300 mg - 1 capsule TID  Steroid injections  We discussed: followed by neurosurgery  Plan: Continue {CHL HP Upstream Pharmacy [phone]}   Misc.   Patient has failed these meds in past:  Patient is currently {CHL Controlled/Uncontrolled:8653240959} on the following medications:   Recent sinus infection/allergies   Flonase 50 mcg - 2 sprays in each nostril daily   Benzonatate 100 mg - 1 q6h PRN cough  (Short term)  Per cardio:  Furosemide 20 mg - 1 tablet daily PRN    Ketoconazle 2% cream  MiraLAX  We discussed:   Plan: Continue {CHL HP Upstream Pharmacy GPCWT:[id number]}  Medication Management  Misc:   OTCs:  Pharmacy/Benefits:  Adherence:  Social support:  Affordability:  CCM Follow Up:  Debbora Dus, PharmD Clinical Pharmacist [facility] [phone]

## 2020-02-24 ENCOUNTER — Ambulatory Visit: Payer: PPO

## 2020-02-24 ENCOUNTER — Other Ambulatory Visit: Payer: Self-pay

## 2020-02-24 ENCOUNTER — Telehealth: Payer: Self-pay

## 2020-02-24 DIAGNOSIS — I43 Cardiomyopathy in diseases classified elsewhere: Secondary | ICD-10-CM

## 2020-02-24 DIAGNOSIS — I10 Essential (primary) hypertension: Secondary | ICD-10-CM

## 2020-02-24 DIAGNOSIS — J432 Centrilobular emphysema: Secondary | ICD-10-CM

## 2020-02-24 NOTE — Patient Instructions (Addendum)
February 24, 2020  Dear [name],  It was a pleasure meeting you during our initial appointment on February 24, 2020. Below is a summary of the goals we discussed and components of chronic care management. Please contact me anytime with questions or concerns.   Visit Information  Goals Addressed            This Visit's Progress   . Pharmacy Care Plan       CARE PLAN ENTRY (see longitudinal plan of care for additional care plan information)  Current Barriers:  . Chronic Disease Management support, education, and care coordination needs related to Hypertension and Cardiomyopathy   Hypertension BP Readings from Last 3 Encounters:  01/14/20 120/62  12/18/19 (!) 144/61  12/09/19 126/64 .  Pharmacist Clinical Goal(s): o Over the next 6 months, patient will work with PharmD and providers to maintain BP goal <140/90 mmHg . Current regimen:   Losartan 25 mg - 1 tablet daily  Metoprolol succinate 25 mg - 1/2 tablet daily . Interventions: o Reviewed home blood pressure readings o Comprehensive medication review . 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 Pharmacist Clinical Goal(s) o Over the next 6 months, patient will work with PharmD and providers to prevent fluid overload and shortness of breath . Current regimen:   Metoprolol succinate 25 mg - 1/2 tablet daily  Furosemide 20 mg - 1 tablet daily as needed . Interventions: o Discussed daily weight monitoring  . Patient self care activities - Over the next 6 months, patient will: o Check weight daily in the morning before breakfast. Take Lasix if weight gain of 3 or more pounds in 24 hours or 5 pounds in 1 week.   Initial goal documentation      [name] was given information about Chronic  Care Management services today including:  1. CCM service includes personalized support from designated clinical staff supervised by his physician, including individualized plan of care and coordination with other care providers 2. 24/7 contact phone numbers for assistance for urgent and routine care needs. 3. Standard insurance, coinsurance, copays and deductibles apply for chronic care management only during months in which we provide at least 20 minutes of these services. Most insurances cover these services at 100%, however patients may be responsible for any copay, coinsurance and/or deductible if applicable. This service may help you avoid the need for more expensive face-to-face services. 4. Only one practitioner may furnish and bill the service in a calendar month. 5. The patient may stop CCM services at any time (effective at the end of the month) by phone call to the office staff.  Patient agreed to services and verbal consent obtained.   The patient verbalized understanding of instructions provided today and agreed to receive a mailed copy of patient instruction and/or educational materials. Telephone follow up appointment with pharmacy team member scheduled for:  09/10/20 at 9 AM (telephone)  Debbora Dus, PharmD Clinical Pharmacist [facility] [phone]   [place] stands for "Dietary Approaches to Stop Hypertension." The DASH eating plan is a healthy eating plan that has been shown to reduce high blood pressure (hypertension). It may also reduce your risk for type 2 diabetes, heart disease, and stroke. The DASH eating  plan may also help with weight loss. What are tips for following this plan?  General guidelines  Avoid eating more than 2,300 mg (milligrams) of salt (sodium) a day. If you have hypertension, you may need to reduce your sodium intake to 1,500 mg a day.  Limit alcohol intake to no more than 1 drink a day for nonpregnant  women and 2 drinks a day for men. One drink equals 12 oz of beer, 5 oz of wine, or 1 oz of hard liquor.  Work with your health care provider to maintain a healthy body weight or to lose weight. Ask what an ideal weight is for you.  Get at least 30 minutes of exercise that causes your heart to beat faster (aerobic exercise) most days of the week. Activities may include walking, swimming, or biking.  Work with your health care provider or diet and nutrition specialist (dietitian) to adjust your eating plan to your individual calorie needs. Reading food labels   Check food labels for the amount of sodium per serving. Choose foods with less than 5 percent of the Daily Value of sodium. Generally, foods with less than 300 mg of sodium per serving fit into this eating plan.  To find whole grains, look for the word "whole" as the first word in the ingredient list. Shopping  Buy products labeled as "low-sodium" or "no salt added."  Buy fresh foods. Avoid canned foods and premade or frozen meals. Cooking  Avoid adding salt when cooking. Use salt-free seasonings or herbs instead of table salt or sea salt. Check with your health care provider or pharmacist before using salt substitutes.  Do not fry foods. Cook foods using healthy methods such as baking, boiling, grilling, and broiling instead.  Cook with heart-healthy oils, such as olive, canola, soybean, or sunflower oil. Meal planning  Eat a balanced diet that includes: ? 5 or more servings of fruits and vegetables each day. At each meal, try to fill half of your plate with fruits and vegetables. ? Up to 6-8 servings of whole grains each day. ? Less than 6 oz of lean meat, poultry, or fish each day. A 3-oz serving of meat is about the same size as a deck of cards. One egg equals 1 oz. ? 2 servings of low-fat dairy each day. ? A serving of nuts, seeds, or beans 5 times each week. ? Heart-healthy fats. Healthy fats called Omega-3 fatty acids  are found in foods such as flaxseeds and coldwater fish, like sardines, salmon, and mackerel.  Limit how much you eat of the following: ? Canned or prepackaged foods. ? Food that is high in trans fat, such as fried foods. ? Food that is high in saturated fat, such as fatty meat. ? Sweets, desserts, sugary drinks, and other foods with added sugar. ? Full-fat dairy products.  Do not salt foods before eating.  Try to eat at least 2 vegetarian meals each week.  Eat more home-cooked food and less restaurant, buffet, and fast food.  When eating at a restaurant, ask that your food be prepared with less salt or no salt, if possible. What foods are recommended? The items listed may not be a complete list. Talk with your dietitian about what dietary choices are best for you. Grains Whole-grain or whole-wheat bread. Whole-grain or whole-wheat pasta. Brown rice. [place]. Bulgur. Whole-grain and low-sodium cereals. Pita bread. Low-fat, low-sodium crackers. Whole-wheat flour tortillas. Vegetables Fresh or frozen vegetables (raw, steamed, roasted, or grilled). Low-sodium or reduced-sodium  tomato and vegetable juice. Low-sodium or reduced-sodium tomato sauce and tomato paste. Low-sodium or reduced-sodium canned vegetables. Fruits All fresh, dried, or frozen fruit. Canned fruit in natural juice (without added sugar). Meat and other protein foods Skinless chicken or [place]. Ground chicken or [place]. Pork with fat trimmed off. Fish and seafood. Egg whites. Dried beans, peas, or lentils. Unsalted nuts, nut butters, and seeds. Unsalted canned beans. Lean cuts of beef with fat trimmed off. Low-sodium, lean deli meat. Dairy Low-fat (1%) or fat-free (skim) milk. Fat-free, low-fat, or reduced-fat cheeses. Nonfat, low-sodium ricotta or cottage cheese. Low-fat or nonfat yogurt. Low-fat, low-sodium cheese. Fats and oils Soft margarine without trans fats. Vegetable oil. Low-fat, reduced-fat, or light  mayonnaise and salad dressings (reduced-sodium). Canola, safflower, olive, soybean, and sunflower oils. Avocado. Seasoning and other foods Herbs. Spices. Seasoning mixes without salt. Unsalted popcorn and pretzels. Fat-free sweets. What foods are not recommended? The items listed may not be a complete list. Talk with your dietitian about what dietary choices are best for you. Grains Baked goods made with fat, such as croissants, muffins, or some breads. Dry pasta or rice meal packs. Vegetables Creamed or fried vegetables. Vegetables in a cheese sauce. Regular canned vegetables (not low-sodium or reduced-sodium). Regular canned tomato sauce and paste (not low-sodium or reduced-sodium). Regular tomato and vegetable juice (not low-sodium or reduced-sodium). Angie Fava. Olives. Fruits Canned fruit in a light or heavy syrup. Fried fruit. Fruit in cream or butter sauce. Meat and other protein foods Fatty cuts of meat. Ribs. Fried meat. Berniece Salines. Sausage. Bologna and other processed lunch meats. Salami. Fatback. Hotdogs. Bratwurst. Salted nuts and seeds. Canned beans with added salt. Canned or smoked fish. Whole eggs or egg yolks. Chicken or [place] with skin. Dairy Whole or 2% milk, cream, and half-and-half. Whole or full-fat cream cheese. Whole-fat or sweetened yogurt. Full-fat cheese. Nondairy creamers. Whipped toppings. Processed cheese and cheese spreads. Fats and oils Butter. Stick margarine. Lard. Shortening. Ghee. Bacon fat. Tropical oils, such as coconut, palm kernel, or palm oil. Seasoning and other foods Salted popcorn and pretzels. Onion salt, garlic salt, seasoned salt, table salt, and sea salt. Worcestershire sauce. Tartar sauce. Barbecue sauce. Teriyaki sauce. Soy sauce, including reduced-sodium. Steak sauce. Canned and packaged gravies. Fish sauce. Oyster sauce. Cocktail sauce. Horseradish that you find on the shelf. Ketchup. Mustard. Meat flavorings and tenderizers. Bouillon cubes. Hot sauce and  Tabasco sauce. Premade or packaged marinades. Premade or packaged taco seasonings. Relishes. Regular salad dressings. Where to find more information:  National Heart, Lung, and [place]: https://wilson-eaton.com/  American Heart Association: www.heart.org Summary  The DASH eating plan is a healthy eating plan that has been shown to reduce high blood pressure (hypertension). It may also reduce your risk for type 2 diabetes, heart disease, and stroke.  With the DASH eating plan, you should limit salt (sodium) intake to 2,300 mg a day. If you have hypertension, you may need to reduce your sodium intake to 1,500 mg a day.  When on the DASH eating plan, aim to eat more fresh fruits and vegetables, whole grains, lean proteins, low-fat dairy, and heart-healthy fats.  Work with your health care provider or diet and nutrition specialist (dietitian) to adjust your eating plan to your individual calorie needs. This information is not intended to replace advice given to you by your health care provider. Make sure you discuss any questions you have with your health care provider. Document Revised: 07/14/2017 Document Reviewed: 07/25/2016 Elsevier Patient Education  2020 [employer].

## 2020-02-24 NOTE — Telephone Encounter (Signed)
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

## 2020-02-25 ENCOUNTER — Ambulatory Visit: Payer: PPO | Admitting: Pulmonary Disease

## 2020-02-25 ENCOUNTER — Other Ambulatory Visit: Payer: Self-pay

## 2020-02-25 ENCOUNTER — Encounter: Payer: Self-pay | Admitting: Pulmonary Disease

## 2020-02-25 ENCOUNTER — Telehealth: Payer: Self-pay

## 2020-02-25 VITALS — BP 130/60 | HR 80 | Temp 97.4°F | Ht 72.0 in | Wt 211.4 lb

## 2020-02-25 DIAGNOSIS — K219 Gastro-esophageal reflux disease without esophagitis: Secondary | ICD-10-CM | POA: Diagnosis not present

## 2020-02-25 DIAGNOSIS — J8410 Pulmonary fibrosis, unspecified: Secondary | ICD-10-CM | POA: Diagnosis not present

## 2020-02-25 DIAGNOSIS — I5189 Other ill-defined heart diseases: Secondary | ICD-10-CM

## 2020-02-25 DIAGNOSIS — J449 Chronic obstructive pulmonary disease, unspecified: Secondary | ICD-10-CM | POA: Diagnosis not present

## 2020-02-25 DIAGNOSIS — R0602 Shortness of breath: Secondary | ICD-10-CM | POA: Diagnosis not present

## 2020-02-25 DIAGNOSIS — Z8616 Personal history of COVID-19: Secondary | ICD-10-CM | POA: Diagnosis not present

## 2020-02-25 MED ORDER — OMEPRAZOLE 20 MG PO CPDR: 20.0000 mg | DELAYED_RELEASE_CAPSULE | Freq: Every day | ORAL | 4 refills | Status: DC

## 2020-02-25 NOTE — Telephone Encounter (Signed)
Pt is aware of date/time of covid test prior to PFT. Nothing further is needed.

## 2020-02-25 NOTE — Progress Notes (Signed)
Subjective:    Patient ID: [name], male    DOB: 1941/07/15, 79 y.o.   MRN: [id number]  HPI Is a 79 year old former smoker (quit 1990) who presents for evaluation of shortness of breath started in or around the time he was diagnosed with COVID-19 in early October 2020.  The patient is kindly referred by Dr. Ria Bush.  The patient actually was admitted to [facility] during his COVID-19 illness.  He had 2 hospitalizations one in October and one in November due to relapsing symptoms.  He had persistent problems with shortness of breath that were gradually becoming better however in May 2021 required trip to the emergency room at [facility] due to increasing dyspnea and cough productive of purulent sputum.  He had a CT scan of the chest which showed that he had changes of emphysema/COPD as well as some persisting groundglass opacities/fibrosis likely residua from the COVID-19 infection.  He was told at that time that he should see a pulmonologist.  Patient notes that walking on inclines and taking stairs makes him short of breath.  Walking on level ground not so much.  He also is affected by humidity and hot weather.  He has not used a rescue inhaler to determine if this would help him.  He was given a trial of Spiriva however he states that this elevated his blood pressure which is usually not a side effect of Spiriva.  He does complain of chest pain and gastroesophageal reflux symptoms that are quite prominent.  He also has had lower extremity edema.  He follows with cardiology for coronary artery disease and sees Dr. Ida Rogue for that.  An echocardiogram was in February 2021 and showed low normal LVEF.  He has diastolic dysfunction grade 1 no pulmonary hypertension noted.  No wall motion abnormalities.  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  Patient has not had any recent fevers, chills or sweats.   No cough or sputum production since his most recent visit to the ED.  No hemoptysis.  He does not endorse any orthopnea or lower extremity edema.  He does have occasional nocturnal awakenings with dyspnea.  He believes some of this may be related to reflux.  Review of Systems  A 10 point review of systems was performed and it is as noted above otherwise negative.  Past Medical History:  Diagnosis Date  . Actinic keratosis   . BENIGN PROSTATIC HYPERTROPHY, WITH URINARY OBSTRUCTION 09/06/2007  . CAD s/p CABG    a. 1990 s/p MI-->CABG x 2; b. 2002 s/p BMS to LCX; c. 06/2015 Cath: LM 50ost, LAD 100ost/p, 43m/d, D2 nl, LCX  patent stent, RCA 80p (small), LIMA->LAD nl, VG->D2 nl-->Med Rx.  . Carotid arterial disease ([place])    a. 08/2016 Carotid U/S: <39% bilat.  . CHF (congestive heart failure) ([place])   . 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 Community acquired pneumonia of right lower lobe of lung 06/05/2017  . COVID-19 virus infection 05/30/2019   Covid PNA with hospitalization 05/2019  . 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 DVT, HX OF 1998  . Emphysema of lung ([place])   . GERD 04/30/2007  . Headache    hx migraines  . History of hiatal hernia   . History of shingles   . HYPERLIPIDEMIA 04/26/2007  . HYPERTENSION 04/30/2007  . INGUINAL HERNIA, RIGHT 05/26/2010  . Laceration of skin of left hand 03/08/2018  .  Lumbar disc disease with radiculopathy   . Myocardial infarction ([place])    1989, 2002  . OSA (obstructive sleep apnea)    a. did not tolerate CPAP.  Marland Kitchen Osteoarthritis   . PAD (peripheral artery disease) ([place])    a. 07/2017 LE duplex: RSFA 75-47m, LSFA 75-59m, 50-74d.  . Pneumonia 07/2014   [facility] hospitalization  . Pneumonia due to COVID-19 virus 06/02/2019  . Pre-diabetes   . PSA, INCREASED 07/09/2008  . Spinal stenosis of lumbar region    Past Surgical History:  Procedure Laterality Date  . ABDOMINAL AORTOGRAM W/LOWER EXTREMITY [name] 09/12/2018   Procedure: ABDOMINAL AORTOGRAM  W/LOWER EXTREMITY;  Surgeon: [name], MD;  Location: [facility];  Service: Cardiovascular;  Laterality: [name];  . APPENDECTOMY     rupture  . CARDIAC CATHETERIZATION [name] 07/07/2015   Procedure: Left Heart Cath and Cors/Grafts Angiography;  Surgeon: [name], MD;  Location: [facility];  Service: Cardiovascular;  Laterality: [name];  . CATARACT EXTRACTION, BILATERAL    . CERVICAL SPINE SURGERY  12/2016   cervical stenosis [name])  . COLONOSCOPY  03/2010   HP polyp, diverticulosis, rpt 10 yrs ([name])  . CORONARY ARTERY BYPASS GRAFT  1990   3 vessel   . CYSTOSCOPY  12/23/10   [facility]  . EYE SURGERY     cataract  . KNEE ARTHROSCOPY Right    x2  . LAMINOTOMY  1986   L5/S1 lumbar laminotomy for two ruptured discs/fusion  . LUMBAR LAMINECTOMY/DECOMPRESSION MICRODISCECTOMY [name] 07/13/2016   Procedure: LUMBAR TWO-THREE, LUMBAR THREE-FOUR, LUMBAR FOUR-FIVE LAMINECTOMY AND FORAMINOTOMY;  Surgeon: [name], MD;  Location: [facility];  Service: Neurosurgery;  Laterality: [name];  LAMINECTOMY AND FORAMINOTOMY L2-L3, L3-L4,L4-L5  . TOTAL HIP ARTHROPLASTY Bilateral 1999,2000  . TOTAL KNEE ARTHROPLASTY Right 03/06/2017   Procedure: RIGHT TOTAL KNEE ARTHROPLASTY;  Surgeon: Gaynelle Arabian, MD;  Location: [facility];  Service: Orthopedics;  Laterality: Right;   Family History  Problem Relation Age of Onset  . Stroke Mother   . Heart attack Mother   . Diabetes Mother   . Stroke Father   . Heart disease Father   . Kidney disease Father        PCKD  . Cancer Sister        throat  . Diabetes Brother   . Kidney disease Sister        PCKD  . Cancer Other        5/7 nephews with lung cancer   Social History   Tobacco Use  . Smoking status: Former Smoker    Packs/day: 1.00    Years: 25.00    Pack years: 25.00    Types: Cigarettes    Quit date: 08/15/1988    Years since quitting: 31.5  . Smokeless tobacco: Never Used  Substance Use Topics  . 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  Allergies  Allergen Reactions  . Vioxx [Rofecoxib] Other (See Comments)    Hemorrhage   . Spiriva Respimat [Tiotropium Bromide Monohydrate] Other (See Comments)    Elevated bp  . Contrast Media [Iodinated Diagnostic Agents] Itching and Rash    Delayed reaction post abdominal aortagram.   . Morphine Nausea Only and Other (See Comments)    Irritability    Current Meds  Medication Sig  . acetaminophen (TYLENOL) 650 MG CR tablet Take 1,300 mg by mouth  every 8 (eight) hours as needed for pain.  Marland Kitchen albuterol (VENTOLIN HFA) 108 (90 Base) MCG/ACT inhaler Inhale 2 puffs into the lungs every 6 (six) hours as needed for wheezing or shortness of breath.  Marland Kitchen aspirin EC 81 MG EC tablet Take 1 tablet (81 mg total) by mouth daily.  . Cyanocobalamin (B-12) 1000 MCG SUBL Place 1 tablet under the tongue daily.  Marland Kitchen ezetimibe (ZETIA) 10 MG tablet Take 1 tablet (10 mg total) by mouth daily.  . fluticasone (FLONASE) 50 MCG/ACT nasal spray Place 2 sprays into both nostrils daily.  . furosemide (LASIX) 20 MG tablet Take 1 tablet (20 mg total) by mouth daily as needed.  . gabapentin (NEURONTIN) 300 MG capsule Take 1 capsule (300 mg total) by mouth 3 (three) times daily.  . isosorbide mononitrate (IMDUR) 30 MG 24 hr tablet Take 1 tablet (30 mg total) by mouth at bedtime.  Marland Kitchen ketoconazole (NIZORAL) 2 % cream Apply 1 application topically daily as needed for irritation.   Marland Kitchen losartan (COZAAR) 25 MG tablet Take 1 tablet (25 mg total) by mouth daily.  . metoprolol succinate (TOPROL XL) 25 MG 24 hr tablet Take 0.5 tablets (12.5 mg total) by mouth daily.  . nitroGLYCERIN (NITROSTAT) 0.4 MG SL tablet Place 1 tablet (0.4 mg total) under the tongue every 5 (five) minutes as needed for chest pain.  . polyethylene glycol (MIRALAX / GLYCOLAX) 17 g packet Take 17 g by mouth every other day.     . ranolazine (RANEXA) 500 MG 12 hr tablet TAKE ONE TABLET TWICE DAILY  . simvastatin (ZOCOR) 40 MG tablet Take 1 tablet (40 mg total) by mouth daily.  Marland Kitchen tolterodine (DETROL LA) 4 MG 24 hr capsule Take 4 mg by mouth daily.  . [DISCONTINUED] benzonatate (TESSALON PERLES) 100 MG capsule Take 1 capsule (100 mg total) by mouth every 6 (six) hours as needed for cough.  . [DISCONTINUED] tiotropium (SPIRIVA HANDIHALER) 18 MCG inhalation capsule Place 1 capsule (18 mcg total) into inhaler and inhale daily.    Immunization History  Administered Date(s) Administered  . Fluad Quad(high Dose 65+) 04/17/2019  . Influenza Whole 08/15/2000, 07/04/2007, 05/09/2008, 05/31/2010  . Influenza, High Dose Seasonal PF 05/15/2013, 06/25/2015  . Influenza,inj,Quad PF,6+ Mos 04/17/2014, 05/18/2016, 06/07/2018  . Influenza-Unspecified 05/22/2017  . PFIZER SARS-COV-2 Vaccination 09/07/2019, 09/28/2019  . Pneumococcal Conjugate-13 02/09/2015  . Pneumococcal Polysaccharide-23 08/15/2000, 10/29/2008  . Td 08/15/2005  . Tdap 02/10/2018  . Zoster 02/04/2009  . Zoster Recombinat (Shingrix) 04/05/2017, 07/25/2017      Objective:   Physical Exam BP 130/60 (BP Location: Right Arm, Cuff Size: Normal)   Pulse 80   Temp (!) 97.4 F (36.3 C) (Oral)   Ht 6' (1.829 m)   Wt 211 lb 6.4 oz (95.9 kg)   SpO2 97%   BMI 28.67 kg/m    GENERAL: This is a well-developed, overweight gentleman who is awake, alert, no acute distress.  Fully ambulatory. HEAD: Normocephalic, atraumatic.  EYES: Pupils equal, round, reactive to light.  No scleral icterus.  MOUTH: Nose/mouth/throat not examined due to masking requirements for COVID 19. NECK: Supple. No thyromegaly. Trachea midline. No JVD.  No adenopathy. PULMONARY: Good air entry bilaterally, coarse breath sounds, no other adventitious sounds. CARDIOVASCULAR: S1 and S2. Regular rate and rhythm.  Grade 1/6 systolic ejection murmur left sternal border. GASTROINTESTINAL: Protuberant  abdomen, soft, no tenderness.   MUSCULOSKELETAL: No joint deformity, no clubbing, no edema.  NEUROLOGIC: No overt focal deficits noted.  Speech is fluent.  No gait disturbance noted. SKIN: Intact,warm,dry.  Limited exam shows no rashes.  Well healed scar of prior right total knee replacement. PSYCH: Mood and behavior normal.  Ambulatory oximetry: Performed today and show no desaturations.  Patient maintained 95% on room air throughout the test.  Representative slice of CT performed 18 Dec 2019, independently reviewed:       Assessment & Plan:     ICD-10-CM   1. Shortness of breath  R06.02 Pulmonary Function Test [facility] Only   This could be related to COPD DDX: postinflammatory pulmonary fibrosis post COVID-19 DDX: Cardiac etiology DDX: Deconditioning  2. COPD suggested by initial evaluation ([place])  J44.9    PFTs ordered Ambulatory oximetry was normal  3. 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 Diastolic dysfunction  R74.08    This issue adds complexity to his management May be adding to his sensation of dyspnea  6. Personal history of covid-19  Z86.16    COVID-03 June 2019 through November 2020   Meds ordered this encounter  Medications  . omeprazole (PRILOSEC) 20 MG capsule    Sig: Take 1 capsule (20 mg total) by mouth daily.    Dispense:  30 capsule    Refill:  4   Orders Placed This Encounter  Procedures  . Pulmonary Function Test [facility] Only    Standing Status:   Future    Standing Expiration Date:   02/24/2021    Scheduling Instructions:     First available    Order Specific Question:   Full PFT: includes the following: basic spirometry, spirometry pre & post bronchodilator, diffusion capacity (DLCO), lung volumes    Answer:   Full PFT    Order Specific Question:   This  test can only be performed at    Answer:   [facility]   Discussion:  Patient has had issues with dyspnea since being hospitalized for COVID-19.  This may be due to postinflammatory pulmonary post COVID-19.  Other possibilities include COPD with poorly compensated state.  He had issues with Spiriva and that he feels that that caused him to have hypertension this is not a known side effect of the medication however, will await making a decision on reintroducing any other respiratory medications until PFTs are known.  In the meantime he can use as needed albuterol.  Has not had difficulties with albuterol.  Other possibilities for his dyspnea include diastolic dysfunction, coronary artery disease, pulmonary hypertension, and/or deconditioning among others.  He is having significant issues with gastroesophageal reflux he was instructed on appropriate antireflux measures and will institute therapy with omeprazole.  We will see the patient in follow-up in 4 to 6 weeks time, he is to contact us prior to that time should any new difficulties arise.   Renold Don, MD [facility]   *This note was dictated using voice recognition software/Dragon.  Despite best efforts to proofread, errors can occur which can change the meaning.  Any change was purely unintentional.

## 2020-02-25 NOTE — Patient Instructions (Addendum)
What we discussed today:  We are going to obtain breathing tests  You did well on your walking test today  We will see you back in 4 to 6 weeks time  We have given you a prescription of medication to prevent reflux

## 2020-02-26 ENCOUNTER — Encounter: Payer: Self-pay | Admitting: Pulmonary Disease

## 2020-02-28 ENCOUNTER — Other Ambulatory Visit: Payer: Self-pay

## 2020-02-28 ENCOUNTER — Other Ambulatory Visit
Admission: RE | Admit: 2020-02-28 | Discharge: 2020-02-28 | Disposition: A | Discharge status: Home / Self Care | Payer: PPO | Source: Ambulatory Visit | Attending: Surgery | Admitting: Surgery

## 2020-02-28 DIAGNOSIS — Z01812 Encounter for preprocedural laboratory examination: Secondary | ICD-10-CM | POA: Insufficient documentation

## 2020-02-28 DIAGNOSIS — Z20822 Contact with and (suspected) exposure to covid-19: Secondary | ICD-10-CM | POA: Diagnosis not present

## 2020-02-28 LAB — SARS CORONAVIRUS 2 (TAT 6-24 HRS): SARS Coronavirus 2: NEGATIVE

## 2020-03-02 ENCOUNTER — Ambulatory Visit: Payer: PPO | Attending: Pulmonary Disease

## 2020-03-02 ENCOUNTER — Other Ambulatory Visit: Payer: Self-pay

## 2020-03-02 DIAGNOSIS — R0602 Shortness of breath: Secondary | ICD-10-CM | POA: Insufficient documentation

## 2020-03-02 DIAGNOSIS — Z8616 Personal history of COVID-19: Secondary | ICD-10-CM | POA: Diagnosis not present

## 2020-03-03 NOTE — Progress Notes (Signed)
I have collaborated with the care management provider regarding care management and care coordination activities outlined in this encounter and have reviewed this encounter including documentation in the note and care plan. I am certifying that I agree with the content of this note and encounter as supervising physician.  

## 2020-03-04 ENCOUNTER — Other Ambulatory Visit: Payer: Self-pay | Admitting: Cardiovascular Disease

## 2020-03-19 ENCOUNTER — Other Ambulatory Visit: Payer: Self-pay | Admitting: Cardiovascular Disease

## 2020-04-07 ENCOUNTER — Ambulatory Visit: Payer: PPO | Admitting: Podiatry

## 2020-04-07 ENCOUNTER — Other Ambulatory Visit: Payer: Self-pay

## 2020-04-07 ENCOUNTER — Encounter: Payer: Self-pay | Admitting: Pulmonary Disease

## 2020-04-07 ENCOUNTER — Ambulatory Visit: Payer: PPO | Admitting: Pulmonary Disease

## 2020-04-07 VITALS — BP 126/60 | HR 77 | Temp 97.3°F | Ht 72.0 in | Wt 213.2 lb

## 2020-04-07 DIAGNOSIS — J449 Chronic obstructive pulmonary disease, unspecified: Secondary | ICD-10-CM

## 2020-04-07 DIAGNOSIS — I739 Peripheral vascular disease, unspecified: Secondary | ICD-10-CM

## 2020-04-07 DIAGNOSIS — R0602 Shortness of breath: Secondary | ICD-10-CM

## 2020-04-07 DIAGNOSIS — L600 Ingrowing nail: Secondary | ICD-10-CM

## 2020-04-07 MED ORDER — ANORO ELLIPTA 62.5-25 MCG/INH IN AEPB
1.0000 | INHALATION_SPRAY | Freq: Every day | RESPIRATORY_TRACT | 0 refills | Status: AC
Start: 2020-04-07 — End: 2020-04-08

## 2020-04-07 NOTE — Progress Notes (Signed)
 Assessment & Plan:  1. COPD with emphysema (HCC) (Primary) Comments: FEV1 preserved Emphysema by CT Mild DLCO decrease PFT supports mild to moderate by LLN Trial of Anoro  2. Shortness of breath Comments: Likely due to COPD Trial of Anoro   Patient Instructions  We discussed your breathing test today.  You do have mild emphysema.  We are going to give you a trial of Anoro Ellipta  to see if this helps with your breathing let us  know how this does for you.  We will see you in follow-up in 4 months time call sooner should any new problems arise.  Please note: late entry documentation due to logistical difficulties during COVID-19 pandemic. This note is filed for information purposes only, and is not intended to be used for billing, nor does it represent the full scope/nature of the visit in question. Please see any associated scanned media linked to date of encounter for additional pertinent information.  Subjective:    HPI: [name] is a 79 y.o. male presenting to the pulmonology clinic on 04/07/2020 with report of: Follow-up (PFT 03/02/2020--occ sob with exertion. )     Outpatient Encounter Medications as of 04/07/2020  Medication Sig Note   aspirin  EC 81 MG EC tablet Take 1 tablet (81 mg total) by mouth daily.    polyethylene glycol (MIRALAX  / GLYCOLAX ) 17 g packet Take 17 g by mouth daily as needed for mild constipation.  01/17/2024: PRN   [DISCONTINUED] acetaminophen  (TYLENOL ) 650 MG CR tablet Take 1,300 mg by mouth at bedtime.     [DISCONTINUED] albuterol  (VENTOLIN  HFA) 108 (90 Base) MCG/ACT inhaler Inhale 2 puffs into the lungs every 6 (six) hours as needed for wheezing or shortness of breath.    [DISCONTINUED] Cyanocobalamin  (B-12) 1000 MCG SUBL Place 1 tablet under the tongue daily.    [DISCONTINUED] ezetimibe  (ZETIA ) 10 MG tablet Take 1 tablet (10 mg total) by mouth daily.    [DISCONTINUED] fluticasone  (FLONASE ) 50 MCG/ACT nasal spray Place 2 sprays into both  nostrils daily. (Patient not taking: Reported on 10/01/2020)    [DISCONTINUED] furosemide  (LASIX ) 20 MG tablet TAKE ONE TABLET BY MOUTH EVERY DAY AS NEEDED    [DISCONTINUED] gabapentin  (NEURONTIN ) 300 MG capsule Take 1 capsule (300 mg total) by mouth 3 (three) times daily.    [DISCONTINUED] isosorbide  mononitrate (IMDUR ) 30 MG 24 hr tablet Take 1 tablet (30 mg total) by mouth at bedtime.    [DISCONTINUED] ketoconazole  (NIZORAL ) 2 % cream Apply 1 application topically daily as needed for irritation.     [DISCONTINUED] losartan  (COZAAR ) 25 MG tablet Take 1 tablet (25 mg total) by mouth daily.    [DISCONTINUED] metoprolol  succinate (TOPROL -XL) 25 MG 24 hr tablet TAKE 1/2 TABLET EVERYDAY    [DISCONTINUED] nitroGLYCERIN  (NITROSTAT ) 0.4 MG SL tablet Place 1 tablet (0.4 mg total) under the tongue every 5 (five) minutes as needed for chest pain.    [DISCONTINUED] omeprazole  (PRILOSEC) 20 MG capsule Take 1 capsule (20 mg total) by mouth daily.    [DISCONTINUED] ranolazine  (RANEXA ) 500 MG 12 hr tablet TAKE ONE TABLET TWICE DAILY    [DISCONTINUED] simvastatin  (ZOCOR ) 40 MG tablet Take 1 tablet (40 mg total) by mouth daily.    [DISCONTINUED] tolterodine (DETROL LA) 4 MG 24 hr capsule Take 4 mg by mouth daily.    [EXPIRED] umeclidinium-vilanterol (ANORO ELLIPTA ) 62.5-25 MCG/INH AEPB Inhale 1 puff into the lungs daily for 1 day.    No facility-administered encounter medications on file as of 04/07/2020.  Objective:   Vitals:   04/07/20 1131  BP: 126/60  Pulse: 77  Temp: (!) 97.3 F (36.3 C)  Height: 6' (1.829 m)  Weight: 213 lb 3.2 oz (96.7 kg)  SpO2: 98%  TempSrc: Temporal  BMI (Calculated): 28.91     Physical exam documentation is limited by delayed entry of information.

## 2020-04-07 NOTE — Patient Instructions (Signed)
We discussed your breathing test today.  You do have mild emphysema.  We are going to give you a trial of Anoro Ellipta to see if this helps with your breathing let us know how this does for you.  We will see you in follow-up in 4 months time call sooner should any new problems arise.

## 2020-04-07 NOTE — Progress Notes (Signed)
   Subjective: Patient presents today for evaluation of a possible ingrown toenail to the medial border of the left great toe.  Patient states that he believes it was an ingrown toenail however he trimmed it out himself.  Currently he is asymptomatic.  He states he has never had ingrown toenails before.  He presents for further treatment and evaluation  Past Medical History:  Diagnosis Date  . Actinic keratosis   . BENIGN PROSTATIC HYPERTROPHY, WITH URINARY OBSTRUCTION 09/06/2007  . CAD s/p CABG    a. 1990 s/p MI-->CABG x 2; b. 2002 s/p BMS to LCX; c. 06/2015 Cath: LM 50ost, LAD 100ost/p, 30m/d, D2 nl, LCX  patent stent, RCA 80p (small), LIMA->LAD nl, VG->D2 nl-->Med Rx.  . Carotid arterial disease ([place])    a. 08/2016 Carotid U/S: <39% bilat.  . CHF (congestive heart failure) ([place])   . 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 Community acquired pneumonia of right lower lobe of lung 06/05/2017  . COVID-19 virus infection 05/30/2019   Covid PNA with hospitalization 05/2019  . 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 DVT, HX OF 1998  . Emphysema of lung ([place])   . GERD 04/30/2007  . Headache    hx migraines  . History of hiatal hernia   . History of shingles   . HYPERLIPIDEMIA 04/26/2007  . HYPERTENSION 04/30/2007  . INGUINAL HERNIA, RIGHT 05/26/2010  . Laceration of skin of left hand 03/08/2018  . Lumbar disc disease with radiculopathy   . Myocardial infarction ([place])    1989, 2002  . OSA (obstructive sleep apnea)    a. did not tolerate CPAP.  Marland Kitchen Osteoarthritis   . PAD (peripheral artery disease) ([place])    a. 07/2017 LE duplex: RSFA 75-67m, LSFA 75-19m, 50-74d.  . Pneumonia 07/2014   [facility] hospitalization  . Pneumonia due to COVID-19 virus 06/02/2019  . Pre-diabetes   . PSA, INCREASED 07/09/2008  . Spinal stenosis of lumbar region     Objective:  General: Well developed, nourished, in no acute distress, alert and oriented x3   Dermatology: Skin is warm, dry and supple bilateral.  No  tenderness to palpation and no evidence of an ingrowing nail.  Negative for any erythema or drainage.  Otherwise normal exam dermatologically  Vascular: Dorsalis Pedis artery and Posterior Tibial artery pedal pulses palpable. No lower extremity edema noted.   Neruologic: Grossly intact via light touch bilateral.  Musculoskeletal: Muscular strength within normal limits in all groups bilateral. Normal range of motion noted to all pedal and ankle joints.   Assesement: #1 possible ingrown toenail left hallux-resolved  Plan of Care:  1. Patient evaluated.  2.  Light debridement of the area was performed using a tissue nipper without incident or bleeding.  Patient is doing well with no symptoms and there is no evidence of an ingrowing nail 3.  Recommend good supportive shoe gear 4.  Return to clinic as needed  Edrick Kins, DPM [facility]  Dr. Edrick Kins, [address]                                        [place], [place] [address]                Office [phone]  Fax [phone]

## 2020-04-16 ENCOUNTER — Other Ambulatory Visit: Payer: Self-pay | Admitting: Cardiovascular Disease

## 2020-04-16 ENCOUNTER — Other Ambulatory Visit: Payer: Self-pay | Admitting: Family Medicine

## 2020-04-17 NOTE — Telephone Encounter (Signed)
Gabapentin Last filled:  01/20/20, #[id number] Last OV:  01/14/20, AWV prt 2 Next OV:  07/15/20, 6 mo f/u

## 2020-04-21 DIAGNOSIS — M4316 Spondylolisthesis, lumbar region: Secondary | ICD-10-CM | POA: Diagnosis not present

## 2020-04-21 DIAGNOSIS — M7138 Other bursal cyst, other site: Secondary | ICD-10-CM | POA: Diagnosis not present

## 2020-04-21 DIAGNOSIS — M961 Postlaminectomy syndrome, not elsewhere classified: Secondary | ICD-10-CM | POA: Diagnosis not present

## 2020-04-21 DIAGNOSIS — M5416 Radiculopathy, lumbar region: Secondary | ICD-10-CM | POA: Diagnosis not present

## 2020-04-25 NOTE — Progress Notes (Signed)
Cardiology Office Note  Date:  04/27/2020   ID:  [name], DOB 1941/02/04, MRN [id number]  PCP:  Ria Bush, MD   Chief Complaint  Patient presents with  . Other    6 month follow up. patient c.o a little chest pain and SOB. Meds reviewed verbally with patient.     HPI:  79 year old gentleman with hx of OSA, did not tolerate CPAP mild  bilateral carotid arterial disease  <39% bilaterally 08/2016 hyperlipidemia  coronary artery disease,  bypass in 1990,  stenting to the circumflex in 2002,  Last cath 06/2015, patent grafts x 2, RCA small vessel diffuse disease DVT in the left lower extremity,  Completed back surgery for spinal stenosis, 06/20/2016 Cervical neck surgery 01/2017, [name]  total knee surgery in 02/2017 covid 05/20/2019 EF was 35 to 40% in 06/2019 Repeat echo February 2021  ejection fraction 50 to 55% who presents for routine followup Of his coronary artery disease, post  covid  Recently seen by pulmonary February 25, 2020 Felt he could have postinflammatory pulmonary issues post Covid Also with COPD poorly compensated Changes to  his inhalers made Medication for GERD  No changes in breathing on inhalers  BP at home 831 systolic Back pain, 5/17 today BP up today "from pain"  Sedentary, no exercise Took himself off lasix, polyuria  Having accidents Will sit for hours playing cards  Weight up  Labs reviewed HBA1C 6.1 CR 1.27, in 12/2019  EKG personally reviewed by myself on todays visit Shows normal sinus rhythm rate 68 bpm , no significant ST-T wave changes, LAFB  Other records reviewed CT chest 06/2019 Diffuse multifocal areas of consolidation and ground-glass opacity throughout the lungs compatible with atypical infection in the setting of known COVID positivity.  Stress test 03/2019 Pharmacological myocardial perfusion imaging study with no significant ischemia Small region mild fixed perfusion defect in the distal  anteroseptal and apical region, unable to exclude old scar vs attenuation artifact EF estimated at 38%, septal wall hypokinesis, possibly secondary to CABG/post-operative state.  No EKG changes concerning for ischemia at peak stress or in recovery. Low risk scan  05/2019 COVID , and hospital 10 days treated with remdesivir and steroids, antibodies little bit of leg swelling but he attributed to not taking diuretics   Back in the hospital again for an additional 10-day stay October 28 he started to run another fever up to 101.3 has not had any chest pain or wheezing no headaches. blood pressure has been  running low in the 90s so he stopped using his lisinopril. His blood pressure after that improved to 130s over 60s  CT chest,  . Diffuse multifocal areas of consolidation and ground-glass opacity throughout the lungs compatible with atypical infection in the setting of known COVID positivity.  LE arterial 08/2018,   Right lower extremity: Moderately to severely calcified SFA with diffuse disease throughout its course especially in the midsegment and three-vessel runoff below the knee.  AAA less than 3 cm, on scan in 2018 reviewed with him Numerous previous episode of chest pain relieved with belching consistent with GI etiology.  Previous cardiac catheterization 07/07/2015  Ost LM to LM lesion, 50% stenosed.  Ost RCA lesion, 80% stenosed.  Prox RCA-1 lesion, 80% stenosed.  Prox RCA-2 lesion, 80% stenosed.  LIMA was injected is moderate in size, and is anatomically normal.  Dist LAD lesion, 60% stenosed.  Ost LAD to Prox LAD lesion, 100% stenosed.  Mid LAD lesion, 60% stenosed.  The  left ventricular systolic function is normal.  SVG was injected is normal in caliber, and is anatomically normal.  2nd Diag lesion, 50% stenosed.  severe RCA disease (small  vessel) not amenable to PCI, moderate left main disease, moderate LAD and proximal diagonal disease. 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  PSH:    Past Surgical History:  Procedure Laterality Date  . ABDOMINAL AORTOGRAM W/LOWER EXTREMITY [name] 09/12/2018   Procedure: ABDOMINAL AORTOGRAM W/LOWER EXTREMITY;  Surgeon: [name], MD;  Location: [facility];  Service: Cardiovascular;  Laterality: [name];  . APPENDECTOMY     rupture  . CARDIAC CATHETERIZATION [name] 07/07/2015   Procedure: Left Heart Cath and Cors/Grafts Angiography;  Surgeon: [name], MD;  Location: [facility];  Service: Cardiovascular;  Laterality: [name];  . CATARACT EXTRACTION, BILATERAL    . CERVICAL SPINE SURGERY  12/2016   cervical stenosis [name])  . COLONOSCOPY  03/2010   HP polyp, diverticulosis, rpt 10 yrs ([name])  . CORONARY ARTERY BYPASS GRAFT  1990   3 vessel   . CYSTOSCOPY  12/23/10   [facility]  . EYE SURGERY      cataract  . KNEE ARTHROSCOPY Right    x2  . LAMINOTOMY  1986   L5/S1 lumbar laminotomy for two ruptured discs/fusion  . LUMBAR LAMINECTOMY/DECOMPRESSION MICRODISCECTOMY [name] 07/13/2016   Procedure: LUMBAR TWO-THREE, LUMBAR THREE-FOUR, LUMBAR FOUR-FIVE LAMINECTOMY AND FORAMINOTOMY;  Surgeon: [name], MD;  Location: [facility];  Service: Neurosurgery;  Laterality: [name];  LAMINECTOMY AND FORAMINOTOMY L2-L3, L3-L4,L4-L5  . TOTAL HIP ARTHROPLASTY Bilateral 1999,2000  . TOTAL KNEE ARTHROPLASTY Right 03/06/2017   Procedure: RIGHT TOTAL KNEE ARTHROPLASTY;  Surgeon: Gaynelle Arabian, MD;  Location: [facility];  Service: Orthopedics;  Laterality: Right;    Current Outpatient Medications  Medication Sig Dispense Refill  . acetaminophen (TYLENOL) 650 MG CR tablet Take 1,300 mg by mouth every 8 (eight) hours as needed for pain.    Marland Kitchen albuterol (VENTOLIN HFA) 108 (90 Base) MCG/ACT inhaler Inhale 2 puffs into the lungs every 6 (six) hours as needed for wheezing or shortness of breath. 18 g 3  . aspirin EC 81 MG EC tablet Take 1 tablet (81 mg total) by mouth daily. 30 tablet 0  . Cyanocobalamin (B-12) 1000 MCG SUBL Place 1 tablet under the tongue daily. 30 each   . ezetimibe (ZETIA) 10 MG tablet Take 1 tablet (10 mg total) by mouth daily. 90 tablet 3  . fluticasone (FLONASE) 50 MCG/ACT nasal spray Place 2 sprays into both nostrils daily. 16 g 0  . furosemide (LASIX) 20 MG tablet TAKE ONE TABLET BY MOUTH EVERY DAY AS NEEDED 30 tablet 3  . gabapentin (NEURONTIN) 300 MG capsule TAKE 1 CAPSULE BY MOUTH THREE TIMES  DAILY 270 capsule 1  . isosorbide mononitrate (IMDUR) 30 MG 24 hr tablet Take 1 tablet (30 mg total) by mouth at bedtime. 90 tablet 3  . ketoconazole (NIZORAL) 2 % cream Apply 1 application topically daily as needed for irritation.     Marland Kitchen losartan (COZAAR) 25 MG tablet TAKE 1 TABLET BY MOUTH DAILY 90 tablet 0  . metoprolol succinate (TOPROL-XL) 25 MG 24 hr tablet TAKE 1/2 TABLET EVERYDAY 45 tablet 0  .  nitroGLYCERIN (NITROSTAT) 0.4 MG SL tablet Place 1 tablet (0.4 mg total) under the tongue every 5 (five) minutes as needed for chest pain. 25 tablet 3  . omeprazole (PRILOSEC) 20 MG capsule Take 1 capsule (20 mg total) by mouth daily. 30 capsule 4  . polyethylene glycol (MIRALAX / GLYCOLAX) 17 g packet Take 17 g by mouth every other day.     . ranolazine (RANEXA) 500 MG 12 hr tablet TAKE ONE TABLET TWICE DAILY 180 tablet 2  . simvastatin (ZOCOR) 40 MG tablet Take 1 tablet (40 mg total) by mouth daily. 90 tablet 3  . tolterodine (DETROL LA) 4 MG 24 hr capsule Take 4 mg by mouth daily.     No current facility-administered medications for this visit.     Allergies:   Vioxx [rofecoxib], Spiriva respimat [tiotropium bromide monohydrate], Contrast media [iodinated diagnostic agents], and Morphine   Social History:  The patient  reports that he quit smoking about 31 years ago. His smoking use included cigarettes. He has a 25.00 pack-year smoking history. He has never used smokeless tobacco. He reports that he does not drink alcohol and does not use drugs.   Family History:   family history includes Cancer in his sister and another family member; Diabetes in his brother and mother; Heart attack in his mother; Heart disease in his father; Kidney disease in his father and sister; Stroke in his father and mother.   Review of Systems: Review of Systems  Constitutional: Negative.   HENT: Negative.   Respiratory: Negative.   Cardiovascular: Negative.   Gastrointestinal: Negative.   Musculoskeletal: Negative.   Neurological: Negative.   Psychiatric/Behavioral: Negative.   All other systems reviewed and are negative.  PHYSICAL EXAM: VS:  BP (!) 160/70 (BP Location: Left Arm)   Pulse 68   Ht 6' (1.829 m)   Wt 214 lb (97.1 kg)   SpO2 96%   BMI 29.02 kg/m  , BMI Body mass index is 29.02 kg/m. Constitutional:  oriented to person, place, and time. No distress.  HENT:  Head: Grossly normal Eyes:   no discharge. No scleral icterus.  Neck: No JVD, no carotid bruits  Cardiovascular: Regular rate and rhythm, no murmurs appreciated Pulmonary/Chest: Clear to auscultation bilaterally, no wheezes or rails Abdominal: Soft.  no distension.  no tenderness.  Musculoskeletal: Normal range of motion Neurological:  normal muscle tone. Coordination normal. No atrophy Skin: Skin warm and dry Psychiatric: normal affect, pleasant   Recent Labs: 06/19/2019: B Natriuretic Peptide 275.7 06/24/2019: Magnesium 2.3 01/07/2020: ALT 14; BUN 30; Creatinine, Ser 1.27; Hemoglobin 13.8; Platelets 193.0; Potassium 4.6; Sodium 137    Lipid Panel Lab Results  Component Value Date   CHOL 109 06/04/2019   HDL 33 (L) 06/04/2019   LDLCALC 62 06/04/2019   TRIG 78 06/18/2019      Wt Readings from Last 3 Encounters:  04/27/20 214 lb (97.1 kg)  04/07/20 213 lb 3.2 oz (96.7 kg)  02/25/20 211 lb 6.4 oz (95.9 kg)  ASSESSMENT AND PLAN: Cardiomyopathy, post covid Recovered, EF up to 69 -55% by echo 09/2019 No changes made to his medications  Cad with stable angina Currently with no symptoms of angina. No further workup at this time. Continue current medication regimen.  Covid , 05/2019 Chronic shortness of breath As close follow-up with pulmonary  COPD/emphysema Prior smoking history, Covid 2020 Followed by pulmonary  Mixed hyperlipidemia Cholesterol is at goal on the current lipid regimen. No changes to the medications were made.  Essential hypertension Blood pressure is well controlled on today's visit. No changes made to the medications.  Bilateral carotid artery stenosis Mild bilateral carotid disease less than 39% bilaterally Continue aggressive lipid management  Diet-controlled diabetes mellitus (HCC) Weight stable Higher numbers with prednisone  Ex-smoker Nonsmoker  PAD Follows with Dr. [name] ,  bilateral SFA disease Having atypical pain down legs    Total encounter time more  than 25 minutes  Greater than 50% was spent in counseling and coordination of care with the patient    No orders of the defined types were placed in this encounter.    Signed, Esmond Plants, M.D., Ph.D. 04/27/2020  [place], [place]

## 2020-04-27 ENCOUNTER — Other Ambulatory Visit: Payer: Self-pay

## 2020-04-27 ENCOUNTER — Encounter: Payer: Self-pay | Admitting: Cardiovascular Disease

## 2020-04-27 ENCOUNTER — Ambulatory Visit: Payer: PPO | Admitting: Cardiovascular Disease

## 2020-04-27 VITALS — BP 160/70 | HR 68 | Ht 72.0 in | Wt 214.0 lb

## 2020-04-27 DIAGNOSIS — I10 Essential (primary) hypertension: Secondary | ICD-10-CM | POA: Diagnosis not present

## 2020-04-27 DIAGNOSIS — E785 Hyperlipidemia, unspecified: Secondary | ICD-10-CM | POA: Diagnosis not present

## 2020-04-27 DIAGNOSIS — I739 Peripheral vascular disease, unspecified: Secondary | ICD-10-CM | POA: Diagnosis not present

## 2020-04-27 DIAGNOSIS — I6523 Occlusion and stenosis of bilateral carotid arteries: Secondary | ICD-10-CM

## 2020-04-27 DIAGNOSIS — U071 COVID-19: Secondary | ICD-10-CM | POA: Diagnosis not present

## 2020-04-27 DIAGNOSIS — I25118 Atherosclerotic heart disease of native coronary artery with other forms of angina pectoris: Secondary | ICD-10-CM | POA: Diagnosis not present

## 2020-04-27 DIAGNOSIS — I43 Cardiomyopathy in diseases classified elsewhere: Secondary | ICD-10-CM

## 2020-04-27 NOTE — Patient Instructions (Signed)
Medication Instructions:  No changes  If you need a refill on your cardiac medications before your next appointment, please call your pharmacy.    Lab work: No new labs needed   If you have labs (blood work) drawn today and your tests are completely normal, you will receive your results only by: . MyChart Message (if you have MyChart) OR . A paper copy in the mail If you have any lab test that is abnormal or we need to change your treatment, we will call you to review the results.   Testing/Procedures: No new testing needed   Follow-Up: At [facility], you and your health needs are our priority.  As part of our continuing mission to provide you with exceptional heart care, we have created designated Provider Care Teams.  These Care Teams include your primary Cardiologist (physician) and Advanced Practice Providers (APPs -  Physician Assistants and Nurse Practitioners) who all work together to provide you with the care you need, when you need it.  . You will need a follow up appointment in 6 months  . Providers on your designated Care Team:   . Christopher Berge, NP . Ryan Dunn, PA-C . Jacquelyn Visser, PA-C  Any Other Special Instructions Will Be Listed Below (If Applicable).  COVID-19 Vaccine Information can be found at: [email or web address] For questions related to vaccine distribution or appointments, please email [email or web address] or call [phone].     

## 2020-04-29 DIAGNOSIS — M961 Postlaminectomy syndrome, not elsewhere classified: Secondary | ICD-10-CM | POA: Diagnosis not present

## 2020-04-29 DIAGNOSIS — M545 Low back pain: Secondary | ICD-10-CM | POA: Diagnosis not present

## 2020-05-05 ENCOUNTER — Other Ambulatory Visit: Payer: Self-pay | Admitting: Neurosurgery

## 2020-05-05 ENCOUNTER — Encounter: Payer: Self-pay | Admitting: Cardiovascular Disease

## 2020-05-05 DIAGNOSIS — Z6828 Body mass index (BMI) 28.0-28.9, adult: Secondary | ICD-10-CM | POA: Diagnosis not present

## 2020-05-05 DIAGNOSIS — M7138 Other bursal cyst, other site: Secondary | ICD-10-CM | POA: Diagnosis not present

## 2020-05-05 DIAGNOSIS — M48062 Spinal stenosis, lumbar region with neurogenic claudication: Secondary | ICD-10-CM | POA: Diagnosis not present

## 2020-05-05 DIAGNOSIS — M4316 Spondylolisthesis, lumbar region: Secondary | ICD-10-CM | POA: Diagnosis not present

## 2020-05-05 NOTE — Telephone Encounter (Signed)
   [facility] Pre-operative Risk Assessment    HEARTCARE STAFF: - Please ensure there is not already an duplicate clearance open for this procedure. - Under Visit Info/Reason for Call, type in Other and utilize the format Clearance MM/DD/YY or Clearance TBD. Do not use dashes or single digits. - If request is for dental extraction, please clarify the # of teeth to be extracted.  Request for surgical clearance:  1. What type of surgery is being performed? Lumbar fusion  2. When is this surgery scheduled? tbd  3. What type of clearance is required (medical clearance vs. Pharmacy clearance to hold med vs. Both)? Not noted  4. Are there any medications that need to be held prior to surgery and how long? Not noted  5. Practice name and name of physician performing surgery? [facility] Dr. [name]  6. What is the office phone number? [phone] ext [phone]   7.   What is the office fax number? [phone]  8.   Anesthesia type (None, local, MAC, general) ? general   Marykay Lex 05/05/2020, 2:43 PM  _________________________________________________________________   (provider comments below)

## 2020-05-05 NOTE — Telephone Encounter (Signed)
This encounter was created in error - please disregard.

## 2020-05-06 NOTE — Telephone Encounter (Signed)
Acceptable risk for procedure, no further testing needed Would prefer he stay on aspirin 81 mg daily if possible given history of coronary disease, CABG, PAD If aspirin a contraindication for surgery,  would try to limit time off the aspirin.  Will defer to surgery

## 2020-05-06 NOTE — Telephone Encounter (Signed)
Dr. Rockey Situ Mr. Langhorst recently saw you for follow up of CAD with hx of CABG and then more recently cardiomyopathy post COVID with EF now normal.  He now needs lumbar fusion under general.  From your note cardiac was stable.  Ok to hold ASA for 5 days before and should we recommend pulmonary clearance as well?  Please send recommendations to pre-op pool  Thanks.

## 2020-05-18 ENCOUNTER — Telehealth: Payer: Self-pay | Admitting: Cardiovascular Disease

## 2020-05-18 NOTE — Telephone Encounter (Signed)
Pt c/o swelling: STAT is pt has developed SOB within 24 hours  1) How much weight have you gained and in what time span? 4 pounds throughout day yesterday  2) If swelling, where is the swelling located? Left foot, right arm  3) Are you currently taking a fluid pill? No, advised to back off so he hasn't taken it  4) Are you currently SOB? no  5) Do you have a log of your daily weights (if so, list)?   6) Have you gained 3 pounds in a day or 5 pounds in a week?   7) Have you traveled recently? no

## 2020-05-18 NOTE — Telephone Encounter (Signed)
Spoke with patient and he reports left foot swelling during the day and also right foot swelling at night. He did report that he has not been taking his furosemide. Reviewed that he should take it daly to see if that will help his swelling and reviewed upcoming appointment, date, and time. Advised that if he doesn't get relief before appointment then to please call back and to bring up at his appointment with provider. He verbalized understanding of our conversation, agreement with plan, and had no further questions at this time.

## 2020-05-21 DIAGNOSIS — R3915 Urgency of urination: Secondary | ICD-10-CM | POA: Diagnosis not present

## 2020-05-21 DIAGNOSIS — N401 Enlarged prostate with lower urinary tract symptoms: Secondary | ICD-10-CM | POA: Diagnosis not present

## 2020-05-21 DIAGNOSIS — Z125 Encounter for screening for malignant neoplasm of prostate: Secondary | ICD-10-CM | POA: Diagnosis not present

## 2020-05-29 ENCOUNTER — Telehealth (INDEPENDENT_AMBULATORY_CARE_PROVIDER_SITE_OTHER): Payer: PPO | Admitting: Primary Care

## 2020-05-29 ENCOUNTER — Other Ambulatory Visit: Payer: PPO

## 2020-05-29 ENCOUNTER — Other Ambulatory Visit: Payer: Self-pay

## 2020-05-29 ENCOUNTER — Encounter: Payer: Self-pay | Admitting: Primary Care

## 2020-05-29 ENCOUNTER — Telehealth: Payer: Self-pay

## 2020-05-29 DIAGNOSIS — J029 Acute pharyngitis, unspecified: Secondary | ICD-10-CM

## 2020-05-29 NOTE — Progress Notes (Signed)
Subjective:    Patient ID: [name], male    DOB: August 18, 1940, 79 y.o.   MRN: [id number]  HPI  Virtual Visit via Video Note  I connected with [name] on 05/29/20 at  2:20 PM EDT by a video enabled telemedicine application and verified that I am speaking with the correct person using two identifiers.  Location: Patient: Home Provider: Office Participants: Patient and myself   I discussed the limitations of evaluation and management by telemedicine and the availability of in person appointments. The patient expressed understanding and agreed to proceed.  History of Present Illness:  [name] is a 79 year old male patient of Dr. Danise Mina with a medical history of Covid-19 infection with cardiomyopathy, CAD, COPD, GERD, hypertension, prediabetes who presents today with a chief complaint of sore throat.  He had Covid-19 infection with pneumonia in October 2020 which resulted in hospitalization. Re-admitted in November 2020 for continued shortness of breath secondary to Covid-19, had a prolonged recovery.   His wife has come down with a "cold" 3-4 days ago with symptoms of bad cough, sore throat, congestion, headaches, nasal congestion. She was tested for Covid-19 yesterday and is awaiting results. His symptoms include sore throat, burning in his chest which began about 24 hours ago. He denies cough and fevers, shortness of breath.   He was vaccinated against Covid-19 in January and February 2021.   Observations/Objective:  Alert and oriented. Appears well, not sickly. No distress. Speaking in complete sentences. No cough during visit  Assessment and Plan:  Appears well on video visit today. Has been vaccinated from [place]. Symptoms for about 24 hours now are mild. Will set him up for Covid-19 testing. Discussed conservative measures for treatment.  Follow Up Instructions:  Come around the back parking lot of our office today at 3 pm for Covid-19  testing.  Please notify us if your symptoms progress.  It was a pleasure to see you today! Allie Bossier, NP-C    I discussed the assessment and treatment plan with the patient. The patient was provided an opportunity to ask questions and all were answered. The patient agreed with the plan and demonstrated an understanding of the instructions.   The patient was advised to call back or seek an in-person evaluation if the symptoms worsen or if the condition fails to improve as anticipated.    [name], NP    Review of Systems  Constitutional: Negative for chills, fatigue and fever.  HENT: Positive for sore throat.   Respiratory: Negative for cough and shortness of breath.   Gastrointestinal: Negative for diarrhea and nausea.       Past Medical History:  Diagnosis Date  . Actinic keratosis   . BENIGN PROSTATIC HYPERTROPHY, WITH URINARY OBSTRUCTION 09/06/2007  . CAD s/p CABG    a. 1990 s/p MI-->CABG x 2; b. 2002 s/p BMS to LCX; c. 06/2015 Cath: LM 50ost, LAD 100ost/p, 55m/d, D2 nl, LCX  patent stent, RCA 80p (small), LIMA->LAD nl, VG->D2 nl-->Med Rx.  . Carotid arterial disease ([place])    a. 08/2016 Carotid U/S: <39% bilat.  . CHF (congestive heart failure) ([place])   . 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 Community acquired pneumonia of right lower lobe of lung 06/05/2017  . COVID-19 virus infection 05/30/2019   Covid PNA with hospitalization 05/2019  . 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 DVT, HX OF 1998  . Emphysema of lung ([place])   . GERD 04/30/2007  . Headache  hx migraines  . History of hiatal hernia   . History of shingles   . HYPERLIPIDEMIA 04/26/2007  . HYPERTENSION 04/30/2007  . INGUINAL HERNIA, RIGHT 05/26/2010  . Laceration of skin of left hand 03/08/2018  . Lumbar disc disease with radiculopathy   . Myocardial infarction ([place])    1989, 2002  . OSA (obstructive sleep apnea)    a. did not tolerate CPAP.  Marland Kitchen Osteoarthritis   . PAD (peripheral artery  disease) ([place])    a. 07/2017 LE duplex: RSFA 75-12m, LSFA 75-73m, 50-74d.  . Pneumonia 07/2014   [facility] hospitalization  . Pneumonia due to COVID-19 virus 06/02/2019  . Pre-diabetes   . PSA, INCREASED 07/09/2008  . Spinal stenosis of lumbar region      Social History   Socioeconomic History  . Marital status: Married    Spouse name: Not on file  . Number of children: Not on file  . Years of education: Not on file  . Highest education level: Not on file  Occupational History  . Not on file  Tobacco Use  . Smoking status: Former Smoker    Packs/day: 1.00    Years: 25.00    Pack years: 25.00    Types: Cigarettes    Quit date: 08/15/1988    Years since quitting: 31.8  . Smokeless tobacco: Never Used  Vaping Use  . Vaping Use: Never used  Substance and Sexual Activity  . Alcohol use: No    Alcohol/week: 0.0 standard drinks  . Drug use: No  . Sexual activity: Yes  Other Topics Concern  . Not on file  Social History Narrative   Married, wife [name], for 25 years in 2016. 1 daughter and 3 grandkids from first marriage.    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    Diet: good water, fruits/vegetables daily      OSA eval recommended while hospitalized with Covid 05/2019   Social Determinants of Health   Financial Resource Strain: Low Risk   . Difficulty of Paying Living Expenses: Not very hard  Food Insecurity:   . Worried About Charity fundraiser in the Last Year: Not on file  . Ran Out of Food in the Last Year: Not on file  Transportation Needs:   . Lack of Transportation (Medical): Not on file  . Lack of Transportation (Non-Medical): Not on file  Physical Activity:   . Days of Exercise per Week: Not on file  . Minutes of Exercise per Session: Not on file  Stress:   . Feeling of Stress : Not on file  Social Connections:   .  Frequency of Communication with Friends and Family: Not on file  . Frequency of Social Gatherings with Friends and Family: Not on file  . Attends Religious Services: Not on file  . Active Member of Clubs or Organizations: Not on file  . Attends Archivist Meetings: Not on file  . Marital Status: Not on file  Intimate Partner Violence:   . Fear of Current or Ex-Partner: Not on file  . Emotionally Abused: Not on file  . Physically Abused: Not on file  . Sexually Abused: Not on file    Past Surgical History:  Procedure Laterality Date  . ABDOMINAL AORTOGRAM W/LOWER EXTREMITY [name] 09/12/2018   Procedure: ABDOMINAL AORTOGRAM W/LOWER EXTREMITY;  Surgeon: [name], MD;  Location: [facility];  Service: Cardiovascular;  Laterality: [name];  . APPENDECTOMY     rupture  . CARDIAC CATHETERIZATION [name] 07/07/2015   Procedure: Left Heart Cath and Cors/Grafts Angiography;  Surgeon: [name], MD;  Location: [facility];  Service: Cardiovascular;  Laterality: [name];  . CATARACT EXTRACTION, BILATERAL    . CERVICAL SPINE SURGERY  12/2016   cervical stenosis [name])  . COLONOSCOPY  03/2010   HP polyp, diverticulosis, rpt 10 yrs ([name])  . CORONARY ARTERY BYPASS GRAFT  1990   3 vessel   . CYSTOSCOPY  12/23/10   [facility]  . EYE SURGERY     cataract  . KNEE ARTHROSCOPY Right    x2  . LAMINOTOMY  1986   L5/S1 lumbar laminotomy for two ruptured discs/fusion  . LUMBAR LAMINECTOMY/DECOMPRESSION MICRODISCECTOMY [name] 07/13/2016   Procedure: LUMBAR TWO-THREE, LUMBAR THREE-FOUR, LUMBAR FOUR-FIVE LAMINECTOMY AND FORAMINOTOMY;  Surgeon: [name], MD;  Location: [facility];  Service: Neurosurgery;  Laterality: [name];  LAMINECTOMY AND FORAMINOTOMY L2-L3, L3-L4,L4-L5  . TOTAL HIP ARTHROPLASTY Bilateral 1999,2000  . TOTAL KNEE ARTHROPLASTY Right 03/06/2017   Procedure: RIGHT TOTAL KNEE ARTHROPLASTY;  Surgeon: Gaynelle Arabian, MD;  Location: [facility];  Service: Orthopedics;  Laterality:  Right;    Family History  Problem Relation Age of Onset  . Stroke Mother   . Heart attack Mother   . Diabetes Mother   . Stroke Father   . Heart disease Father   . Kidney disease Father        PCKD  . Cancer Sister        throat  . Diabetes Brother   . Kidney disease Sister        PCKD  . Cancer Other        5/7 nephews with lung cancer    Allergies  Allergen Reactions  . Vioxx [Rofecoxib] Other (See Comments)    Hemorrhage   . Spiriva Respimat [Tiotropium Bromide Monohydrate] Other (See Comments)    Elevated bp  . Contrast Media [Iodinated Diagnostic Agents] Itching and Rash    Delayed reaction post abdominal aortagram.   . Morphine Nausea Only and Other (See Comments)    Irritability     Current Outpatient Medications on File Prior to Visit  Medication Sig Dispense Refill  . acetaminophen (TYLENOL) 650 MG CR tablet Take 1,300 mg by mouth every 8 (eight) hours as needed for pain.    Marland Kitchen albuterol (VENTOLIN HFA) 108 (90 Base) MCG/ACT inhaler Inhale 2 puffs into the lungs every 6 (six) hours as needed for wheezing or shortness of breath. 18 g 3  . aspirin EC 81 MG EC tablet Take 1 tablet (81 mg total) by mouth daily. 30 tablet 0  . Cyanocobalamin (B-12) 1000 MCG SUBL Place 1 tablet under the tongue daily. 30 each   . ezetimibe (ZETIA) 10 MG tablet Take 1 tablet (10 mg total) by mouth daily. 90 tablet 3  . fluticasone (FLONASE) 50 MCG/ACT nasal spray Place 2 sprays into both nostrils daily. 16 g 0  . furosemide (LASIX) 20 MG tablet TAKE ONE TABLET BY MOUTH EVERY DAY AS NEEDED 30 tablet 3  . gabapentin (NEURONTIN) 300 MG capsule TAKE 1 CAPSULE BY MOUTH THREE TIMES DAILY 270 capsule 1  . isosorbide mononitrate (IMDUR) 30 MG 24 hr tablet Take 1 tablet (30 mg total) by mouth at bedtime. 90 tablet 3  . ketoconazole (NIZORAL) 2 % cream Apply 1 application topically daily as needed for irritation.     Marland Kitchen  losartan (COZAAR) 25 MG tablet TAKE 1 TABLET BY MOUTH DAILY 90 tablet 0  .  metoprolol succinate (TOPROL-XL) 25 MG 24 hr tablet TAKE 1/2 TABLET EVERYDAY 45 tablet 0  . nitroGLYCERIN (NITROSTAT) 0.4 MG SL tablet Place 1 tablet (0.4 mg total) under the tongue every 5 (five) minutes as needed for chest pain. 25 tablet 3  . omeprazole (PRILOSEC) 20 MG capsule Take 1 capsule (20 mg total) by mouth daily. 30 capsule 4  . polyethylene glycol (MIRALAX / GLYCOLAX) 17 g packet Take 17 g by mouth every other day.     . ranolazine (RANEXA) 500 MG 12 hr tablet TAKE ONE TABLET TWICE DAILY 180 tablet 2  . simvastatin (ZOCOR) 40 MG tablet Take 1 tablet (40 mg total) by mouth daily. 90 tablet 3  . tolterodine (DETROL LA) 4 MG 24 hr capsule Take 4 mg by mouth daily.     No current facility-administered medications on file prior to visit.    BP 123/64   Pulse 74   Ht 6' (1.829 m)   Wt 208 lb (94.3 kg)   SpO2 95%   BMI 28.21 kg/m    Objective:   Physical Exam Constitutional:      Appearance: Normal appearance. He is not ill-appearing.  Pulmonary:     Effort: Pulmonary effort is normal.     Comments: No cough during visit Neurological:     Mental Status: He is alert and oriented to person, place, and time.  Psychiatric:        Mood and Affect: Mood normal.            Assessment & Plan:

## 2020-05-29 NOTE — Telephone Encounter (Signed)
FYI  Patient called to report he has not been feeling well since last night, his wife has had URI Sx x 4 days and spouse did get covid test yesterday--awaiting results, but pt did not get tested... pt c/o burning in chest, fatigue, some increase in SOB denies difficulty breathing, denies fever, some body aches and chest tightness... BP 136/70 HR 78 and O2 95%...Marland Kitchen pt has been scheduled for a virtual with Allie Bossier, NP to be evaluated

## 2020-05-29 NOTE — Assessment & Plan Note (Signed)
Appears well on video visit today. Has been vaccinated from [place]. Symptoms for about 24 hours now are mild. Will set him up for Covid-19 testing. Discussed conservative measures for treatment.

## 2020-05-29 NOTE — Patient Instructions (Signed)
Come around the back parking lot of our office today at 3 pm for Covid-19 testing.  Please notify us if your symptoms progress.  It was a pleasure to see you today! Allie Bossier, NP-C

## 2020-05-29 NOTE — Telephone Encounter (Signed)
Noted! Thank you

## 2020-05-31 LAB — NOVEL CORONAVIRUS, NAA: SARS-CoV-2, NAA: NOT DETECTED

## 2020-05-31 LAB — SARS-COV-2, NAA 2 DAY TAT

## 2020-06-01 ENCOUNTER — Telehealth: Payer: PPO | Admitting: Family Medicine

## 2020-06-01 ENCOUNTER — Telehealth: Payer: Self-pay

## 2020-06-01 ENCOUNTER — Other Ambulatory Visit: Payer: Self-pay | Admitting: Neurosurgery

## 2020-06-01 MED ORDER — DOXYCYCLINE HYCLATE 100 MG PO TABS: 100.0000 mg | ORAL_TABLET | Freq: Two times a day (BID) | ORAL | 0 refills | Status: DC

## 2020-06-01 MED ORDER — PREDNISONE 20 MG PO TABS: 40.0000 mg | ORAL_TABLET | Freq: Every day | ORAL | 0 refills | Status: DC

## 2020-06-01 NOTE — Telephone Encounter (Signed)
Spoke with patient.   ST, head and chest congestion, chest burning and dyspnea. Mild wheezing. Coughing up yellow/green mucous this morning.  No fevers, ear or tooth pain.  Symptoms started 05/27/2020.  O2 maintaining 96%, BP 120/80s.  Managing with Tylenol and hydrocodone cough syrup.  Known COPD on imaging but largely asxs.  Wife also ill with similar illness.  Tested negative for COVID on Friday 05/29/2020.   Will treat as mild COPD flare with doxy/prednisone.

## 2020-06-01 NOTE — Telephone Encounter (Addendum)
Pt had virtual with Gentry Fitz NP on 05/29/20 and neg covid on 05/29/20. Please advise.  pt has been scheduled for video visit with Dr Darnell Level today.sending to DR Darnell Level and Gentry Fitz NP.

## 2020-06-01 NOTE — Telephone Encounter (Signed)
[facility] Night - Client TELEPHONE ADVICE RECORD [facility] Patient Name: [name] Gender: Male DOB: 09-29-1940 Age: 79 Y 2 M Return Phone Number: [phone] (Primary) Address: City/State/Zip: [place] [address] Client  [facility] Night - Client Client Site [facility] Physician Ria Bush - MD Contact Type Call Who Is Calling Patient / Member / Family / Caregiver Call Type Triage / Clinical Caller Name [name] Relationship To Patient Self Return Phone Number [phone] (Primary) Chief Complaint Nasal Congestion Reason for Call Symptomatic / Request for [name] states he has an infection. He is coughing up green mucus and blowing out green mucus. He has tested negative for covid. Translation No Nurse Assessment Nurse: Noberto Retort, RN, Malvin Johns Date/Time Eilene Ghazi Time): 05/31/2020 12:39:35 PM Confirm and document reason for call. If symptomatic, describe symptoms. ---Caller states he has an infection. He is coughing up green mucus and blowing out green mucus from his nose. He has tested negative for covid today. No fever. No other s/s at this time. Does the patient have any new or worsening symptoms? ---Yes Will a triage be completed? ---Yes Related visit to physician within the last 2 weeks? ---Yes Does the PT have any chronic conditions? (i.e. diabetes, asthma, this includes High risk factors for pregnancy, etc.) ---Yes List chronic conditions. ---CHF, COPD Is this a behavioral health or substance abuse call? ---No Guidelines Guideline Title Affirmed Question Affirmed Notes Nurse Date/Time (Eastern Time) Common Cold [1] SEVERE sore throat AND [2] present > 24 hours Noberto Retort, RN, Malvin Johns 05/31/2020 12:40:25 PM Disp. Time Eilene Ghazi Time) Disposition Final User 05/31/2020 12:41:59 PM See PCP within 24 Hours Yes Noberto Retort, RN, Malvin Johns Caller  Disagree/Comply Comply PLEASE NOTE: All timestamps contained within this report are represented as [place] Standard Time. CONFIDENTIALTY NOTICE: This fax transmission is intended only for the addressee. It contains information that is legally privileged, confidential or otherwise protected from use or disclosure. If you are not the intended recipient, you are strictly prohibited from reviewing, disclosing, copying using or disseminating any of this information or taking any action in reliance on or regarding this information. If you have received this fax in error, please notify us immediately by telephone so that we can arrange for its return to [place]. Phone: [phone], Toll-Free: [phone], Fax: [phone] Page: 2 of 2 Call Id: [id number] [place] Understands Yes PreDisposition InappropriateToAsk Care Advice Given Per Guideline SEE PCP WITHIN 24 HOURS: * IF OFFICE WILL BE OPEN: You need to be examined within the next 24 hours. Call your doctor (or NP/PA) when the office opens and make an appointment. SORE THROAT: * Sip warm chicken broth or apple juice. * Here are some simple things you can do to treat and reduce sore throat pain. * Suck on hard candy or an over-the-counter throat lozenge. * Gargle with warm salt water four times a day. To make salt water, put 1/2 teaspoon of salt in 8 oz (240 ml) of warm water. HUMIDIFIER: * If the air is dry, use a humidifier in the bedroom. * Dry air makes coughs worse. CALL BACK IF: * Fever over 104 F (40 C) * You have difficulty breathing * You become worse CARE ADVICE given per Common Cold (Adult) guideline. Referrals REFERRED TO PCP OFFICE

## 2020-06-02 ENCOUNTER — Encounter: Payer: Self-pay | Admitting: Cardiovascular Disease

## 2020-06-02 ENCOUNTER — Other Ambulatory Visit: Payer: Self-pay

## 2020-06-02 ENCOUNTER — Ambulatory Visit: Payer: PPO | Admitting: Cardiovascular Disease

## 2020-06-02 VITALS — BP 126/50 | HR 80 | Ht 72.0 in | Wt 217.0 lb

## 2020-06-02 DIAGNOSIS — R079 Chest pain, unspecified: Secondary | ICD-10-CM

## 2020-06-02 DIAGNOSIS — I739 Peripheral vascular disease, unspecified: Secondary | ICD-10-CM

## 2020-06-02 DIAGNOSIS — I2510 Atherosclerotic heart disease of native coronary artery without angina pectoris: Secondary | ICD-10-CM

## 2020-06-02 DIAGNOSIS — I10 Essential (primary) hypertension: Secondary | ICD-10-CM | POA: Diagnosis not present

## 2020-06-02 DIAGNOSIS — E782 Mixed hyperlipidemia: Secondary | ICD-10-CM

## 2020-06-02 NOTE — Progress Notes (Signed)
Cardiology Office Note   Date:  06/02/2020   ID:  [name], DOB June 26, 1941, MRN [id number]  PCP:  Ria Bush, MD  Cardiologist:  Dr. Rockey Situ   Chief Complaint  Patient presents with  . OTHER    6 month f/u cardiac clearance c/o left leg swelling. Meds reviewed verbally with pt.      History of Present Illness: [name] is a 79 y.o. male who is here today for a follow-up visit regarding peripheral arterial disease. The patient has known history of coronary artery disease status post CABG in 1990, PCI of the left circumflex in 2002, DVT in the left lower extremity, hyperlipidemia and back surgery for spinal stenosis in 2017.  He had hip replacement x 2. 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  He was seen in early 2019 for peripheral arterial disease. Lower extremity arterial Doppler showed noncompressible arteries on the right side with normal toe brachial index and biphasic waveform.  Left ABI was normal with biphasic waveforms. Lower extremity arterial duplex showed evidence of bilateral SFA disease. His symptoms were felt to be multifactorial due to PAD, arthritis and severe peripheral neuropathy.  He had worsening bilateral leg pain in 2020.  Angiography in January of 2020 showed no significant aortoiliac disease.  On the right side, there was moderate to severely calcified SFA with diffuse disease especially in the midsegment and three-vessel runoff below the knee.  On the left, there was borderline significant SFA disease which was also calcified with three-vessel runoff below the knee.  I felt that his symptoms were out of proportion to his angiogram findings. The patient was hospitalized in October 2020 with COVID-19 pneumonia and had multiple complications.  He continued to have significant shortness of breath after that. He is scheduled for back surgery with Dr. [name] in December.   He had recent swelling of the left foot that improved with resuming furosemide.  He has some discomfort in the foot as well.  He had few episodes of substernal chest tightness recently and he complains of continued dyspnea with minimal activities. Echocardiogram and February of this year showed an EF of 50 to 55% with grade 1 diastolic dysfunction.  He is not able to walk a long distance due to his back pain and shortness of breath.  Past Medical History:  Diagnosis Date  . Actinic keratosis   . BENIGN PROSTATIC HYPERTROPHY, WITH URINARY OBSTRUCTION 09/06/2007  . CAD s/p CABG    a. 1990 s/p MI-->CABG x 2; b. 2002 s/p BMS to LCX; c. 06/2015 Cath: LM 50ost, LAD 100ost/p, 10m/d, D2 nl, LCX  patent stent, RCA 80p (small), LIMA->LAD nl, VG->D2 nl-->Med Rx.  . Carotid arterial disease ([place])    a. 08/2016 Carotid U/S: <39% bilat.  . CHF (congestive heart failure) ([place])   . 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 Community acquired pneumonia of right lower lobe of lung 06/05/2017  . COVID-19 virus infection 05/30/2019   Covid PNA with hospitalization 05/2019  . 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 DVT, HX OF 1998  . Emphysema of lung ([place])   . GERD 04/30/2007  . Headache    hx migraines  . History of hiatal hernia   . History of shingles   . HYPERLIPIDEMIA 04/26/2007  . HYPERTENSION 04/30/2007  . INGUINAL HERNIA, RIGHT 05/26/2010  . Laceration of skin of left hand 03/08/2018  . Lumbar disc disease with radiculopathy   .  Myocardial infarction ([place])    1989, 2002  . OSA (obstructive sleep apnea)    a. did not tolerate CPAP.  Marland Kitchen Osteoarthritis   . PAD (peripheral artery disease) ([place])    a. 07/2017 LE duplex: RSFA 75-23m, LSFA 75-21m, 50-74d.  . Pneumonia 07/2014   [facility] hospitalization  . Pneumonia due to COVID-19 virus 06/02/2019  . Pre-diabetes   . PSA, INCREASED 07/09/2008  . Spinal stenosis of lumbar region     Past Surgical History:  Procedure Laterality Date  . ABDOMINAL AORTOGRAM  W/LOWER EXTREMITY [name] 09/12/2018   Procedure: ABDOMINAL AORTOGRAM W/LOWER EXTREMITY;  Surgeon: [name], MD;  Location: [facility];  Service: Cardiovascular;  Laterality: [name];  . APPENDECTOMY     rupture  . CARDIAC CATHETERIZATION [name] 07/07/2015   Procedure: Left Heart Cath and Cors/Grafts Angiography;  Surgeon: [name], MD;  Location: [facility];  Service: Cardiovascular;  Laterality: [name];  . CATARACT EXTRACTION, BILATERAL    . CERVICAL SPINE SURGERY  12/2016   cervical stenosis [name])  . COLONOSCOPY  03/2010   HP polyp, diverticulosis, rpt 10 yrs ([name])  . CORONARY ARTERY BYPASS GRAFT  1990   3 vessel   . CYSTOSCOPY  12/23/10   [facility]  . EYE SURGERY     cataract  . KNEE ARTHROSCOPY Right    x2  . LAMINOTOMY  1986   L5/S1 lumbar laminotomy for two ruptured discs/fusion  . LUMBAR LAMINECTOMY/DECOMPRESSION MICRODISCECTOMY [name] 07/13/2016   Procedure: LUMBAR TWO-THREE, LUMBAR THREE-FOUR, LUMBAR FOUR-FIVE LAMINECTOMY AND FORAMINOTOMY;  Surgeon: [name], MD;  Location: [facility];  Service: Neurosurgery;  Laterality: [name];  LAMINECTOMY AND FORAMINOTOMY L2-L3, L3-L4,L4-L5  . TOTAL HIP ARTHROPLASTY Bilateral 1999,2000  . TOTAL KNEE ARTHROPLASTY Right 03/06/2017   Procedure: RIGHT TOTAL KNEE ARTHROPLASTY;  Surgeon: Gaynelle Arabian, MD;  Location: [facility];  Service: Orthopedics;  Laterality: Right;     Current Outpatient Medications  Medication Sig Dispense Refill  . acetaminophen (TYLENOL) 650 MG CR tablet Take 1,300 mg by mouth every 8 (eight) hours as needed for pain.    Marland Kitchen albuterol (VENTOLIN HFA) 108 (90 Base) MCG/ACT inhaler Inhale 2 puffs into the lungs every 6 (six) hours as needed for wheezing or shortness of breath. 18 g 3  . aspirin EC 81 MG EC tablet Take 1 tablet (81 mg total) by mouth daily. 30 tablet 0  . Cyanocobalamin (B-12) 1000 MCG SUBL Place 1 tablet under the tongue daily. 30 each   . doxycycline (VIBRA-TABS) 100 MG tablet Take 1 tablet  (100 mg total) by mouth 2 (two) times daily. 14 tablet 0  . ezetimibe (ZETIA) 10 MG tablet Take 1 tablet (10 mg total) by mouth daily. 90 tablet 3  . fluticasone (FLONASE) 50 MCG/ACT nasal spray Place 2 sprays into both nostrils daily. 16 g 0  . furosemide (LASIX) 20 MG tablet TAKE ONE TABLET BY MOUTH EVERY DAY AS NEEDED 30 tablet 3  . gabapentin (NEURONTIN) 300 MG capsule TAKE 1 CAPSULE BY MOUTH THREE TIMES DAILY 270 capsule 1  . isosorbide mononitrate (IMDUR) 30 MG 24 hr tablet Take 1 tablet (30 mg total) by mouth at bedtime. 90 tablet 3  . ketoconazole (NIZORAL) 2 % cream Apply 1 application topically daily as needed for irritation.     Marland Kitchen losartan (COZAAR) 25 MG tablet TAKE 1 TABLET BY MOUTH DAILY 90 tablet 0  . metoprolol succinate (TOPROL-XL) 25 MG 24 hr tablet TAKE 1/2 TABLET EVERYDAY 45 tablet 0  .  nitroGLYCERIN (NITROSTAT) 0.4 MG SL tablet Place 1 tablet (0.4 mg total) under the tongue every 5 (five) minutes as needed for chest pain. 25 tablet 3  . omeprazole (PRILOSEC) 20 MG capsule Take 1 capsule (20 mg total) by mouth daily. 30 capsule 4  . polyethylene glycol (MIRALAX / GLYCOLAX) 17 g packet Take 17 g by mouth every other day.     . predniSONE (DELTASONE) 20 MG tablet Take 2 tablets (40 mg total) by mouth daily with breakfast. 10 tablet 0  . ranolazine (RANEXA) 500 MG 12 hr tablet TAKE ONE TABLET TWICE DAILY 180 tablet 2  . simvastatin (ZOCOR) 40 MG tablet Take 1 tablet (40 mg total) by mouth daily. 90 tablet 3  . tolterodine (DETROL LA) 4 MG 24 hr capsule Take 4 mg by mouth daily.    . tamsulosin (FLOMAX) 0.4 MG CAPS capsule Take 0.4 mg by mouth at bedtime.     No current facility-administered medications for this visit.    Allergies:   Vioxx [rofecoxib], Spiriva respimat [tiotropium bromide monohydrate], Contrast media [iodinated diagnostic agents], and Morphine    Social History:  The patient  reports that he quit smoking about 31 years ago. His smoking use included cigarettes.  He has a 25.00 pack-year smoking history. He has never used smokeless tobacco. He reports that he does not drink alcohol and does not use drugs.   Family History:  The patient's family history includes Cancer in his sister and another family member; Diabetes in his brother and mother; Heart attack in his mother; Heart disease in his father; Kidney disease in his father and sister; Stroke in his father and mother.    ROS:  Please see the history of present illness.   Otherwise, review of systems are positive for none.   All other systems are reviewed and negative.    PHYSICAL EXAM: VS:  BP (!) 126/50 (BP Location: Left Arm, Patient Position: Sitting, Cuff Size: Normal)   Pulse 80   Ht 6' (1.829 m)   Wt 217 lb (98.4 kg)   SpO2 98%   BMI 29.43 kg/m  , BMI Body mass index is 29.43 kg/m. GEN: Well nourished, well developed, in no acute distress  HEENT: normal  Neck: no JVD, carotid bruits, or masses Cardiac: RRR; no  rubs, or gallops,no edema .  1/ 6 systolic ejection murmur in the aortic area Respiratory:  clear to auscultation bilaterally, normal work of breathing GI: soft, nontender, nondistended, + BS MS: no deformity or atrophy  Skin: warm and dry, no rash Neuro:  Strength and sensation are intact Psych: euthymic mood, full affect Vascular: Femoral pulses are normal.  Distal pulses are nonpalpable   EKG:  EKG is  ordered today. EKG showed normal sinus rhythm with left anterior fascicular block.  Recent Labs: 06/19/2019: B Natriuretic Peptide 275.7 06/24/2019: Magnesium 2.3 01/07/2020: ALT 14; BUN 30; Creatinine, Ser 1.27; Hemoglobin 13.8; Platelets 193.0; Potassium 4.6; Sodium 137    Lipid Panel    Component Value Date/Time   CHOL 109 06/04/2019 0540   TRIG 78 06/18/2019 2150   HDL 33 (L) 06/04/2019 0540   CHOLHDL 3.3 06/04/2019 0540   VLDL 14 06/04/2019 0540   LDLCALC 62 06/04/2019 0540   LDLDIRECT 106.0 01/01/2016 0844      Wt Readings from Last 3 Encounters:   06/02/20 217 lb (98.4 kg)  05/29/20 208 lb (94.3 kg)  04/27/20 214 lb (97.1 kg)      No flowsheet data found.  ASSESSMENT AND PLAN:  1.  Peripheral arterial disease: The patient does have bilateral calcified SFA disease worse on the right side.  However, his symptoms are worse on the left side and he has a lot of other associated symptoms suggestive of pseudoclaudication and peripheral neuropathy.  I am hoping that some of his symptoms will improve with the upcoming back surgery and that will give [place] a better idea whether he has true claudication or not.  2.  Coronary artery disease involving native coronary arteries with recent episodes of chest pain: In addition, he reports continued exertional dyspnea.  I requested a Lexiscan Myoview.  3.  Hyperlipidemia: He is tolerating simvastatin and Zetia.  4.  Essential hypertension: Blood pressure is controlled.  5.  Preop cardiovascular evaluation: Given his symptoms, I requested a Lexiscan Myoview before surgery.  6.  Left foot swelling: Improved with furosemide and there is currently no significant edema.    Disposition:   FU with me in 6 months   Signed, Kathlyn Sacramento, MD 06/02/20 [place], [place]

## 2020-06-02 NOTE — Patient Instructions (Addendum)
Medication Instructions:  Your physician recommends that you continue on your current medications as directed. Please refer to the Current Medication list given to you today.  *If you need a refill on your cardiac medications before your next appointment, please call your pharmacy*  Lab Work: none If you have labs (blood work) drawn today and your tests are completely normal, you will receive your results only by:  [place] (if you have MyChart) OR  A paper copy in the mail If you have any lab test that is abnormal or we need to change your treatment, we will call you to review the results.  Testing/Procedures: Your physician has requested that you have a lexiscan myoview. For further information please visit [email or web address]. Please follow instruction sheet, as given.  [place]  Your caregiver has ordered a Stress Test with nuclear imaging. The purpose of this test is to evaluate the blood supply to your heart muscle. This procedure is referred to as a "Non-Invasive Stress Test." This is because other than having an IV started in your vein, nothing is inserted or "invades" your body. Cardiac stress tests are done to find areas of poor blood flow to the heart by determining the extent of coronary artery disease (CAD). Some patients exercise on a treadmill, which naturally increases the blood flow to your heart, while others who are  unable to walk on a treadmill due to physical limitations have a pharmacologic/chemical stress agent called Lexiscan . This medicine will mimic walking on a treadmill by temporarily increasing your coronary blood flow.   Please note: these test may take anywhere between 2-4 hours to complete  PLEASE REPORT TO [place] AT THE FIRST DESK WILL DIRECT YOU WHERE TO GO  Date of Procedure:_____________________________________  Arrival Time for Procedure:______________________________  Instructions regarding  medication:   _xx_:  Hold betablocker ( metoprolol ) night before procedure and morning of procedure  _xx_:  Hold other medications as follows:________Furosemide_______  PLEASE NOTIFY THE OFFICE AT LEAST 24 HOURS IN ADVANCE IF YOU ARE UNABLE TO KEEP YOUR APPOINTMENT.  [phone] AND  PLEASE NOTIFY NUCLEAR MEDICINE AT [facility] AT LEAST 24 HOURS IN ADVANCE IF YOU ARE UNABLE TO KEEP YOUR APPOINTMENT. [phone]  How to prepare for your Myoview test:  1. Do not eat or drink after midnight 2. No caffeine for 24 hours prior to test 3. No smoking 24 hours prior to test. 4. Your medication may be taken with water.  If your doctor stopped a medication because of this test, do not take that medication. 5. Please wear a short sleeve shirt. 6. No perfume, cologne or lotion. 7. Wear comfortable walking shoes.     Follow-Up: At [facility], you and your health needs are our priority.  As part of our continuing mission to provide you with exceptional heart care, we have created designated Provider Care Teams.  These Care Teams include your primary Cardiologist (physician) and Advanced Practice Providers (APPs -  Physician Assistants and Nurse Practitioners) who all work together to provide you with the care you need, when you need it.  We recommend signing up for the patient portal called "MyChart".  Sign up information is provided on this After Visit Summary.  MyChart is used to connect with patients for Virtual Visits (Telemedicine).  Patients are able to view lab/test results, encounter notes, upcoming appointments, etc.  Non-urgent messages can be sent to your provider as well.   To learn more about what you  can do with MyChart, go to [email or web address].    Your next appointment:   6 month(s)  The format for your next appointment:   In Person  Provider:   You may see Ida Rogue, MD or one of the following Advanced Practice Providers on your designated Care Team:    Murray Hodgkins, NP  Christell Faith, PA-C  Marrianne Mood, PA-C  Cadence Kathlen Mody, [place]    Cardiac Nuclear Scan A cardiac nuclear scan is a test that measures blood flow to the heart when a person is resting and when he or she is exercising. The test looks for problems such as:  Not enough blood reaching a portion of the heart.  The heart muscle not working normally. You may need this test if:  You have heart disease.  You have had abnormal lab results.  You have had heart surgery or a balloon procedure to open up blocked arteries (angioplasty).  You have chest pain.  You have shortness of breath. In this test, a radioactive dye (tracer) is injected into your bloodstream. After the tracer has traveled to your heart, an imaging device is used to measure how much of the tracer is absorbed by or distributed to various areas of your heart. This procedure is usually done at a hospital and takes 2-4 hours. Tell a health care provider about:  Any allergies you have.  All medicines you are taking, including vitamins, herbs, eye drops, creams, and over-the-counter medicines.  Any problems you or family members have had with anesthetic medicines.  Any blood disorders you have.  Any surgeries you have had.  Any medical conditions you have.  Whether you are pregnant or may be pregnant. What are the risks? Generally, this is a safe procedure. However, problems may occur, including:  Serious chest pain and heart attack. This is only a risk if the stress portion of the test is done.  Rapid heartbeat.  Sensation of warmth in your chest. This usually passes quickly.  Allergic reaction to the tracer. What happens before the procedure?  Ask your health care provider about changing or stopping your regular medicines. This is especially important if you are taking diabetes medicines or blood thinners.  Follow instructions from your health care provider about eating or drinking  restrictions.  Remove your jewelry on the day of the procedure. What happens during the procedure?  An IV will be inserted into one of your veins.  Your health care provider will inject a small amount of radioactive tracer through the IV.  You will wait for 20-40 minutes while the tracer travels through your bloodstream.  Your heart activity will be monitored with an electrocardiogram (ECG).  You will lie down on an exam table.  Images of your heart will be taken for about 15-20 minutes.  You may also have a stress test. For this test, one of the following may be done: ? You will exercise on a treadmill or stationary bike. While you exercise, your heart's activity will be monitored with an ECG, and your blood pressure will be checked. ? You will be given medicines that will increase blood flow to parts of your heart. This is done if you are unable to exercise.  When blood flow to your heart has peaked, a tracer will again be injected through the IV.  After 20-40 minutes, you will get back on the exam table and have more images taken of your heart.  Depending on the type of tracer used, scans may  need to be repeated 3-4 hours later.  Your IV line will be removed when the procedure is over. The procedure may vary among health care providers and hospitals. What happens after the procedure?  Unless your health care provider tells you otherwise, you may return to your normal schedule, including diet, activities, and medicines.  Unless your health care provider tells you otherwise, you may increase your fluid intake. This will help to flush the contrast dye from your body. Drink enough fluid to keep your urine pale yellow.  Ask your health care provider, or the department that is doing the test: ? When will my results be ready? ? How will I get my results? Summary  A cardiac nuclear scan measures the blood flow to the heart when a person is resting and when he or she is  exercising.  Tell your health care provider if you are pregnant.  Before the procedure, ask your health care provider about changing or stopping your regular medicines. This is especially important if you are taking diabetes medicines or blood thinners.  After the procedure, unless your health care provider tells you otherwise, increase your fluid intake. This will help flush the contrast dye from your body.  After the procedure, unless your health care provider tells you otherwise, you may return to your normal schedule, including diet, activities, and medicines. This information is not intended to replace advice given to you by your health care provider. Make sure you discuss any questions you have with your health care provider. Document Revised: 01/15/2018 Document Reviewed: 01/15/2018 Elsevier Patient Education  [place].

## 2020-06-05 ENCOUNTER — Encounter
Admission: RE | Admit: 2020-06-05 | Discharge: 2020-06-05 | Disposition: A | Discharge status: Home / Self Care | Payer: PPO | Source: Ambulatory Visit | Attending: Cardiovascular Disease | Admitting: Cardiovascular Disease

## 2020-06-05 ENCOUNTER — Other Ambulatory Visit: Payer: Self-pay

## 2020-06-05 DIAGNOSIS — R079 Chest pain, unspecified: Secondary | ICD-10-CM | POA: Diagnosis not present

## 2020-06-05 LAB — NM MYOCAR MULTI W/SPECT W/WALL MOTION / EF
LV dias vol: 95 mL (ref 62–150)
LV sys vol: 47 mL
Peak HR: 90 {beats}/min
Percent HR: 63 %
Rest HR: 61 {beats}/min
TID: 1.07

## 2020-06-05 MED ORDER — REGADENOSON 0.4 MG/5ML IV SOLN
0.4000 mg | Freq: Once | INTRAVENOUS | Status: AC
  Administered 2020-06-05: 0.4 mg via INTRAVENOUS
  Filled 2020-06-05: qty 5

## 2020-06-05 MED ORDER — TECHNETIUM TC 99M TETROFOSMIN IV KIT
10.0000 | PACK | Freq: Once | INTRAVENOUS | Status: AC | PRN
  Administered 2020-06-05: 10.47 via INTRAVENOUS

## 2020-06-05 MED ORDER — TECHNETIUM TC 99M TETROFOSMIN IV KIT
30.0000 | PACK | Freq: Once | INTRAVENOUS | Status: AC | PRN
  Administered 2020-06-05: 32.41 via INTRAVENOUS

## 2020-06-10 DIAGNOSIS — S8001XD Contusion of right knee, subsequent encounter: Secondary | ICD-10-CM | POA: Diagnosis not present

## 2020-06-10 DIAGNOSIS — Z96651 Presence of right artificial knee joint: Secondary | ICD-10-CM | POA: Diagnosis not present

## 2020-06-12 ENCOUNTER — Other Ambulatory Visit: Payer: Self-pay | Admitting: Cardiovascular Disease

## 2020-06-15 HISTORY — PX: LUMBAR SPINE SURGERY: SHX701

## 2020-06-22 ENCOUNTER — Other Ambulatory Visit: Payer: Self-pay | Admitting: Dermatology

## 2020-06-24 DIAGNOSIS — S8001XD Contusion of right knee, subsequent encounter: Secondary | ICD-10-CM | POA: Diagnosis not present

## 2020-06-24 DIAGNOSIS — Z96651 Presence of right artificial knee joint: Secondary | ICD-10-CM | POA: Diagnosis not present

## 2020-07-01 NOTE — Progress Notes (Signed)
Your procedure is scheduled on Monday, November 22nd.  Report to [facility] Main Entrance "A" at 5:30 A.M., and check in at the Admitting office.  Call this number if you have problems the morning of surgery:  [phone]  Call [phone] if you have any questions prior to your surgery date Monday-Friday 8am-4pm   Remember:  Do not eat or drink after midnight the night before your surgery    Take these medicines the morning of surgery with A SIP OF WATER  ezetimibe (ZETIA)  gabapentin (NEURONTIN)  metoprolol succinate (TOPROL-XL)  omeprazole (PRILOSEC)  tolterodine (DETROL LA)   If needed: fluticasone (FLONASE)/nasal spray, nitroGLYCERIN (NITROSTAT),  albuterol (VENTOLIN HFA)  - bring with you the day of surgery  Follow your surgeon's instructions on when to stop Aspirin.  If no instructions were given by your surgeon then you will need to call the office to get those instructions.    As of today, STOP t Aleve, Naproxen, Ibuprofen, Motrin, Advil, Goody's, BC's, all herbal medications, fish oil, and all vitamins.                     Do not wear jewelry            Do not wear lotions, powders,colognes, or deodorant.            Men may shave face and neck.            Do not bring valuables to the hospital.            [facility] is not responsible for any belongings or valuables.  Do NOT Smoke (Tobacco/Vaping) or drink Alcohol 24 hours prior to your procedure If you use a CPAP at night, you may bring all equipment for your overnight stay.   Contacts, glasses, dentures or bridgework may not be worn into surgery.      For patients admitted to the hospital, discharge time will be determined by your treatment team.   Patients discharged the day of surgery will not be allowed to drive home, and someone needs to stay with them for 24 hours.  Special instructions:   - Preparing For Surgery  Before surgery, you can play an important role. Because skin is not sterile,  your skin needs to be as free of germs as possible. You can reduce the number of germs on your skin by washing with CHG (chlorahexidine gluconate) Soap before surgery.  CHG is an antiseptic cleaner which kills germs and bonds with the skin to continue killing germs even after washing.    Oral Hygiene is also important to reduce your risk of infection.  Remember - BRUSH YOUR TEETH THE MORNING OF SURGERY WITH YOUR REGULAR TOOTHPASTE  Please do not use if you have an allergy to CHG or antibacterial soaps. If your skin becomes reddened/irritated stop using the CHG.  Do not shave (including legs and underarms) for at least 48 hours prior to first CHG shower. It is OK to shave your face.  Please follow these instructions carefully.   1. Shower the NIGHT BEFORE SURGERY and the MORNING OF SURGERY with CHG Soap.   2. If you chose to wash your hair, wash your hair first as usual with your normal shampoo.  3. After you shampoo, rinse your hair and body thoroughly to remove the shampoo.  4. Use CHG as you would any other liquid soap. You can apply CHG directly to the skin and wash gently with a scrungie  or a clean washcloth.   5. Apply the CHG Soap to your body ONLY FROM THE NECK DOWN.  Do not use on open wounds or open sores. Avoid contact with your eyes, ears, mouth and genitals (private parts). Wash Face and genitals (private parts)  with your normal soap.   6. Wash thoroughly, paying special attention to the area where your surgery will be performed.  7. Thoroughly rinse your body with warm water from the neck down.  8. DO NOT shower/wash with your normal soap after using and rinsing off the CHG Soap.  9. Pat yourself dry with a CLEAN TOWEL.  10. Wear CLEAN PAJAMAS to bed the night before surgery  11. Place CLEAN SHEETS on your bed the night of your first shower and DO NOT SLEEP WITH PETS.  Day of Surgery: Wear Clean/Comfortable clothing the morning of surgery Do not apply any  deodorants/lotions.   Remember to brush your teeth WITH YOUR REGULAR TOOTHPASTE.   Please read over the following fact sheets that you were given.

## 2020-07-02 ENCOUNTER — Encounter (HOSPITAL_COMMUNITY): Payer: Self-pay

## 2020-07-02 ENCOUNTER — Encounter (HOSPITAL_COMMUNITY)
Admission: RE | Admit: 2020-07-02 | Discharge: 2020-07-02 | Disposition: A | Discharge status: Home / Self Care | Payer: PPO | Source: Ambulatory Visit | Attending: Neurosurgery | Admitting: Neurosurgery

## 2020-07-02 ENCOUNTER — Other Ambulatory Visit: Payer: Self-pay

## 2020-07-02 DIAGNOSIS — R0600 Dyspnea, unspecified: Secondary | ICD-10-CM | POA: Insufficient documentation

## 2020-07-02 DIAGNOSIS — J439 Emphysema, unspecified: Secondary | ICD-10-CM | POA: Insufficient documentation

## 2020-07-02 DIAGNOSIS — I700 Atherosclerosis of aorta: Secondary | ICD-10-CM | POA: Diagnosis not present

## 2020-07-02 DIAGNOSIS — Z01812 Encounter for preprocedural laboratory examination: Secondary | ICD-10-CM | POA: Insufficient documentation

## 2020-07-02 DIAGNOSIS — I10 Essential (primary) hypertension: Secondary | ICD-10-CM | POA: Diagnosis not present

## 2020-07-02 DIAGNOSIS — I2510 Atherosclerotic heart disease of native coronary artery without angina pectoris: Secondary | ICD-10-CM | POA: Diagnosis not present

## 2020-07-02 DIAGNOSIS — G4733 Obstructive sleep apnea (adult) (pediatric): Secondary | ICD-10-CM | POA: Insufficient documentation

## 2020-07-02 DIAGNOSIS — E785 Hyperlipidemia, unspecified: Secondary | ICD-10-CM | POA: Insufficient documentation

## 2020-07-02 DIAGNOSIS — Z86718 Personal history of other venous thrombosis and embolism: Secondary | ICD-10-CM | POA: Insufficient documentation

## 2020-07-02 DIAGNOSIS — Z951 Presence of aortocoronary bypass graft: Secondary | ICD-10-CM | POA: Insufficient documentation

## 2020-07-02 HISTORY — DX: Angina pectoris, unspecified: I20.9

## 2020-07-02 LAB — TYPE AND SCREEN
ABO/RH(D): A NEG
Antibody Screen: NEGATIVE

## 2020-07-02 LAB — CBC
HCT: 42.1 % (ref 39.0–52.0)
Hemoglobin: 13.8 g/dL (ref 13.0–17.0)
MCH: 31.9 pg (ref 26.0–34.0)
MCHC: 32.8 g/dL (ref 30.0–36.0)
MCV: 97.2 fL (ref 80.0–100.0)
Platelets: 191 10*3/uL (ref 150–400)
RBC: 4.33 MIL/uL (ref 4.22–5.81)
RDW: 12 % (ref 11.5–15.5)
WBC: 5.7 10*3/uL (ref 4.0–10.5)
nRBC: 0 % (ref 0.0–0.2)

## 2020-07-02 LAB — BASIC METABOLIC PANEL
Anion gap: 7 (ref 5–15)
BUN: 24 mg/dL — ABNORMAL HIGH (ref 8–23)
CO2: 27 mmol/L (ref 22–32)
Calcium: 9.2 mg/dL (ref 8.9–10.3)
Chloride: 108 mmol/L (ref 98–111)
Creatinine, Ser: 1.56 mg/dL — ABNORMAL HIGH (ref 0.61–1.24)
GFR, Estimated: 45 mL/min — ABNORMAL LOW (ref >60)
Glucose, Bld: 167 mg/dL — ABNORMAL HIGH (ref 70–99)
Potassium: 4.2 mmol/L (ref 3.5–5.1)
Sodium: 142 mmol/L (ref 135–145)

## 2020-07-02 LAB — SURGICAL PCR SCREEN
MRSA, PCR: NEGATIVE
Staphylococcus aureus: NEGATIVE

## 2020-07-02 NOTE — Progress Notes (Signed)
PCP -  Ria Bush Cardiologist - Vilma Prader [facility]: Dr. [name]  PPM/ICD - denies   Chest x-ray - 12/18/19 EKG - 06/02/2020 Stress Test - 06/05/20 ECHO - 10/04/2019 Cardiac Cath - 07/07/15  Sleep Study - 2013 in chart CPAP - cpap never ordered, does not currently wear    Follow your surgeon's instructions on when to stop Aspirin.  If no instructions were given by your surgeon then you will need to call the office to get those instructions.    ERAS Protcol -no   COVID TEST- 07/03/2020 1015 at [facility]   Anesthesia review: yes, cardiac clearance 06/06/20 after chemical stress test performed on 06/05/2020. Clearance in stress tests results.  Patient denies shortness of breath, fever, cough and chest pain at PAT appointment   All instructions explained to the patient, with a verbal understanding of the material. Patient agrees to go over the instructions while at home for a better understanding. Patient also instructed to self quarantine after being tested for COVID-19. The opportunity to ask questions was provided.

## 2020-07-03 ENCOUNTER — Other Ambulatory Visit
Admission: RE | Admit: 2020-07-03 | Discharge: 2020-07-03 | Disposition: A | Discharge status: Home / Self Care | Payer: PPO | Source: Ambulatory Visit | Attending: Neurosurgery | Admitting: Neurosurgery

## 2020-07-03 DIAGNOSIS — Z20822 Contact with and (suspected) exposure to covid-19: Secondary | ICD-10-CM | POA: Diagnosis not present

## 2020-07-03 DIAGNOSIS — Z01812 Encounter for preprocedural laboratory examination: Secondary | ICD-10-CM | POA: Insufficient documentation

## 2020-07-03 NOTE — Anesthesia Preprocedure Evaluation (Addendum)
Anesthesia Evaluation  Patient identified by MRN, date of birth, ID band Patient awake    Reviewed: Allergy & Precautions, NPO status , Patient's Chart, lab work & pertinent test results  Airway Mallampati: II  TM Distance: >3 FB Neck ROM: Full    Dental no notable dental hx.    Pulmonary sleep apnea , former smoker,    Pulmonary exam normal breath sounds clear to auscultation       Cardiovascular hypertension, + CAD, + Past MI, + CABG and + Peripheral Vascular Disease  Normal cardiovascular exam Rhythm:Regular Rate:Normal     Neuro/Psych negative neurological ROS  negative psych ROS   GI/Hepatic Neg liver ROS, GERD  Medicated,  Endo/Other  negative endocrine ROS  Renal/GU negative Renal ROS  negative genitourinary   Musculoskeletal negative musculoskeletal ROS (+)   Abdominal   Peds negative pediatric ROS (+)  Hematology negative hematology ROS (+)   Anesthesia Other Findings   Reproductive/Obstetrics negative OB ROS                            Anesthesia Physical Anesthesia Plan  ASA: III  Anesthesia Plan: General   Post-op Pain Management:    Induction: Intravenous  PONV Risk Score and Plan: 2 and Ondansetron, Dexamethasone and Treatment may vary due to age or medical condition  Airway Management Planned: Oral ETT  Additional Equipment:   Intra-op Plan:   Post-operative Plan: Extubation in OR  Informed Consent: I have reviewed the patients History and Physical, chart, labs and discussed the procedure including the risks, benefits and alternatives for the proposed anesthesia with the patient or authorized representative who has indicated his/her understanding and acceptance.     Dental advisory given  Plan Discussed with: CRNA  Anesthesia Plan Comments: (See PAT note by Karoline Caldwell, PA-C  Follows with cardiology for history of CAD status post CABG in 1990, PCI of  left circumflex in 2002, DVT of the LLE, HLD, HTN.  Last seen by Dr. [name] 06/02/2020.  Upcoming surgery discussed, nuclear stress was ordered for risk rotation.  Stress test 06/05/2020 was low risk.  Dr. [name] commented on result stating, "can proceed with surgery at an overall low risk from a cardiac standpoint."  Hospitalized October 2020 with COVID-19 pneumonia.  He has reported issues with dyspnea since hospitalization.  He was seen by pulmonology 02/25/2020.  PFTs were ordered.  Was also noted that he was having significant issues with gastroesophageal reflux and was instructed on appropriate antireflux measures and was started on omeprazole.  Spirometry performed 03/02/2020 showed no obvious evidence of obstructive airway disease or restrictive lung disease.  History of OSA, could not tolerate CPAP.  History of cervical spine fusion in 2018.  Preop labs reviewed, creatinine mildly elevated at 1.56 (review of labs shows recent baseline around 1.3).  Labs otherwise unremarkable.  EKG 06/02/2020: NSR.  Rate 80.  LAFB.  CTA chest 12/18/2019: IMPRESSION: 1.  Negative examination for pulmonary embolism.  2.  Emphysema (ICD10-J43.9).  3. There is residual scarring or atelectasis of the bilateral lung bases resolution of previously seen acute airspace opacity on examination dated 06/18/2019.  4. Cardiomegaly and three-vessel coronary artery calcifications. Aortic Atherosclerosis (ICD10-I70.0).  Nuclear stress 06/05/2020: Low risk, probably normal pharmacologic myocardial perfusion stress test. There is a small in size, moderate in severity, fixed apical anterior and apical defect that most likely represents artifact and less likely scar. This is unchanged from the prior study in  03/2019. There is a small in size, mild in severity, mid inferolateral defect most consistent with artifact but cannot rule out subtle ischemia. The left ventricular ejection fraction appears normal by visual  estimation but is mildly decreased by QGS (48%) and Simens (51%) calculations. Attenuation correction CT shows post-CABG findings and aortic atherosclerosis. Bilateral airspace opacities in the visualized lungs are similar to the CTA chest from 12/18/2019, allowing for differences in technique.  TTE 10/04/2019: 1. Left ventricular ejection fraction, by estimation, is 50 to 55%. The  left ventricle has low normal function. The left ventricle has no regional  wall motion abnormalities. Left ventricular diastolic parameters are  consistent with Grade I diastolic  dysfunction (impaired relaxation).  2. Right ventricular systolic function is normal. The right ventricular  size is normal. There is normal pulmonary artery systolic pressure.   LHC 07/07/2015: Coronary angiography:  Coronary dominance: Left   Left mainstem:  50% diffuse disease, tubular, branching to the LCX, ostial LAD chronically occluded.   Left anterior descending (LAD):  Moderate to large vessel, moderate proximal and distal disease estimated at 50 to 60%, there is LIMA to LAD and SVG to diagonal patent   Left circumflex (LCx): Large dominant vessel, stent patent in proximal region, no significant ISR, moderate OM vessels, collaterals to small RCA   Right coronary artery (RCA): Small diffusely disease, nondominant, severe ostial, proximal disease. Collaterals from the left system.   Left ventriculography: Left ventricular systolic function is normal, LVEF is estimated at 55-65%, there is no significant mitral regurgitation   Final Conclusions:   Angina likely from diffuse disease, severe RCA disease (small vessel) not amenable to PCI, moderate left main disease, moderate LAD and proximal diagonal disease.  --Patent grafts x2  --No intervention indicated, medical management.  )       Anesthesia Quick Evaluation

## 2020-07-03 NOTE — Progress Notes (Signed)
Anesthesia Chart Review:  Follows with cardiology for history of CAD status post CABG in 1990, PCI of left circumflex in 2002, DVT of the LLE, HLD, HTN.  Last seen by Dr. [name] 06/02/2020.  Upcoming surgery discussed, nuclear stress was ordered for risk rotation.  Stress test 06/05/2020 was low risk.  Dr. [name] commented on result stating, "can proceed with surgery at an overall low risk from a cardiac standpoint."  Hospitalized October 2020 with COVID-19 pneumonia.  He has reported issues with dyspnea since hospitalization.  He was seen by pulmonology 02/25/2020.  PFTs were ordered.  Was also noted that he was having significant issues with gastroesophageal reflux and was instructed on appropriate antireflux measures and was started on omeprazole.  Spirometry performed 03/02/2020 showed no obvious evidence of obstructive airway disease or restrictive lung disease.  History of OSA, could not tolerate CPAP.  History of cervical spine fusion in 2018.  Preop labs reviewed, creatinine mildly elevated at 1.56 (review of labs shows recent baseline around 1.3).  Labs otherwise unremarkable.  EKG 06/02/2020: NSR.  Rate 80.  LAFB.  CTA chest 12/18/2019: IMPRESSION: 1.  Negative examination for pulmonary embolism.  2.  Emphysema (ICD10-J43.9).  3. There is residual scarring or atelectasis of the bilateral lung bases resolution of previously seen acute airspace opacity on examination dated 06/18/2019.  4. Cardiomegaly and three-vessel coronary artery calcifications. Aortic Atherosclerosis (ICD10-I70.0).  Nuclear stress 06/05/2020:  Low risk, probably normal pharmacologic myocardial perfusion stress test.  There is a small in size, moderate in severity, fixed apical anterior and apical defect that most likely represents artifact and less likely scar. This is unchanged from the prior study in 03/2019.  There is a small in size, mild in severity, mid inferolateral defect most consistent with  artifact but cannot rule out subtle ischemia.  The left ventricular ejection fraction appears normal by visual estimation but is mildly decreased by QGS (48%) and Simens (51%) calculations.  Attenuation correction CT shows post-CABG findings and aortic atherosclerosis.  Bilateral airspace opacities in the visualized lungs are similar to the CTA chest from 12/18/2019, allowing for differences in technique.  TTE 10/04/2019: 1. Left ventricular ejection fraction, by estimation, is 50 to 55%. The  left ventricle has low normal function. The left ventricle has no regional  wall motion abnormalities. Left ventricular diastolic parameters are  consistent with Grade I diastolic  dysfunction (impaired relaxation).  2. Right ventricular systolic function is normal. The right ventricular  size is normal. There is normal pulmonary artery systolic pressure.   LHC 07/07/2015: Coronary angiography:  Coronary dominance: Left   Left mainstem:  50% diffuse disease, tubular, branching to the LCX, ostial LAD chronically occluded.   Left anterior descending (LAD):  Moderate to large vessel, moderate proximal and distal disease estimated at 50 to 60%, there is LIMA to LAD and SVG to diagonal patent   Left circumflex (LCx): Large dominant vessel, stent patent in proximal region, no significant ISR, moderate OM vessels, collaterals to small RCA   Right coronary artery (RCA): Small diffusely disease, nondominant, severe ostial, proximal disease. Collaterals from the left system.   Left ventriculography: Left ventricular systolic function is normal, LVEF is estimated at 55-65%, there is no significant mitral regurgitation   Final Conclusions:   Angina likely from diffuse disease, severe RCA disease (small vessel) not amenable to PCI, moderate left main disease, moderate LAD and proximal diagonal disease.  --Patent grafts x2  --No intervention indicated, medical management.     Karoline Caldwell,  PA-C [facility]/Anesthesiology Phone [phone] 07/03/2020 9:08 AM

## 2020-07-04 LAB — SARS CORONAVIRUS 2 (TAT 6-24 HRS): SARS Coronavirus 2: NEGATIVE

## 2020-07-06 ENCOUNTER — Inpatient Hospital Stay (HOSPITAL_COMMUNITY): Payer: PPO

## 2020-07-06 ENCOUNTER — Inpatient Hospital Stay (HOSPITAL_COMMUNITY)
Admission: RE | Admit: 2020-07-06 | Discharge: 2020-07-07 | DRG: 455 | Disposition: A | Discharge status: Home / Self Care | Payer: PPO | Attending: Neurosurgery | Admitting: Neurosurgery

## 2020-07-06 ENCOUNTER — Inpatient Hospital Stay (HOSPITAL_COMMUNITY): Payer: PPO | Admitting: Anesthesiology

## 2020-07-06 ENCOUNTER — Encounter (HOSPITAL_COMMUNITY): Payer: Self-pay | Admitting: Neurosurgery

## 2020-07-06 ENCOUNTER — Inpatient Hospital Stay (HOSPITAL_COMMUNITY): Payer: PPO | Admitting: Physician Assistant

## 2020-07-06 ENCOUNTER — Encounter (HOSPITAL_COMMUNITY)
Admission: RE | Disposition: A | Discharge status: Home / Self Care | Payer: Self-pay | Source: Home / Self Care | Attending: Neurosurgery

## 2020-07-06 DIAGNOSIS — E785 Hyperlipidemia, unspecified: Secondary | ICD-10-CM | POA: Diagnosis not present

## 2020-07-06 DIAGNOSIS — Z823 Family history of stroke: Secondary | ICD-10-CM

## 2020-07-06 DIAGNOSIS — I252 Old myocardial infarction: Secondary | ICD-10-CM

## 2020-07-06 DIAGNOSIS — Z79899 Other long term (current) drug therapy: Secondary | ICD-10-CM

## 2020-07-06 DIAGNOSIS — M48062 Spinal stenosis, lumbar region with neurogenic claudication: Secondary | ICD-10-CM | POA: Diagnosis present

## 2020-07-06 DIAGNOSIS — Z841 Family history of disorders of kidney and ureter: Secondary | ICD-10-CM | POA: Diagnosis not present

## 2020-07-06 DIAGNOSIS — I739 Peripheral vascular disease, unspecified: Secondary | ICD-10-CM | POA: Diagnosis present

## 2020-07-06 DIAGNOSIS — I10 Essential (primary) hypertension: Secondary | ICD-10-CM | POA: Diagnosis present

## 2020-07-06 DIAGNOSIS — Z951 Presence of aortocoronary bypass graft: Secondary | ICD-10-CM | POA: Diagnosis not present

## 2020-07-06 DIAGNOSIS — Z91041 Radiographic dye allergy status: Secondary | ICD-10-CM | POA: Diagnosis not present

## 2020-07-06 DIAGNOSIS — Z87891 Personal history of nicotine dependence: Secondary | ICD-10-CM

## 2020-07-06 DIAGNOSIS — Z8616 Personal history of COVID-19: Secondary | ICD-10-CM

## 2020-07-06 DIAGNOSIS — Z96643 Presence of artificial hip joint, bilateral: Secondary | ICD-10-CM | POA: Diagnosis not present

## 2020-07-06 DIAGNOSIS — Z86718 Personal history of other venous thrombosis and embolism: Secondary | ICD-10-CM

## 2020-07-06 DIAGNOSIS — Z885 Allergy status to narcotic agent status: Secondary | ICD-10-CM | POA: Diagnosis not present

## 2020-07-06 DIAGNOSIS — Z7982 Long term (current) use of aspirin: Secondary | ICD-10-CM | POA: Diagnosis not present

## 2020-07-06 DIAGNOSIS — Z8249 Family history of ischemic heart disease and other diseases of the circulatory system: Secondary | ICD-10-CM

## 2020-07-06 DIAGNOSIS — Z96651 Presence of right artificial knee joint: Secondary | ICD-10-CM | POA: Diagnosis not present

## 2020-07-06 DIAGNOSIS — J449 Chronic obstructive pulmonary disease, unspecified: Secondary | ICD-10-CM | POA: Diagnosis not present

## 2020-07-06 DIAGNOSIS — G4733 Obstructive sleep apnea (adult) (pediatric): Secondary | ICD-10-CM | POA: Diagnosis present

## 2020-07-06 DIAGNOSIS — K219 Gastro-esophageal reflux disease without esophagitis: Secondary | ICD-10-CM | POA: Diagnosis not present

## 2020-07-06 DIAGNOSIS — I2510 Atherosclerotic heart disease of native coronary artery without angina pectoris: Secondary | ICD-10-CM | POA: Diagnosis present

## 2020-07-06 DIAGNOSIS — Z888 Allergy status to other drugs, medicaments and biological substances status: Secondary | ICD-10-CM

## 2020-07-06 DIAGNOSIS — M9953 Intervertebral disc stenosis of neural canal of lumbar region: Secondary | ICD-10-CM | POA: Diagnosis not present

## 2020-07-06 DIAGNOSIS — J439 Emphysema, unspecified: Secondary | ICD-10-CM | POA: Diagnosis not present

## 2020-07-06 DIAGNOSIS — M4316 Spondylolisthesis, lumbar region: Secondary | ICD-10-CM | POA: Diagnosis present

## 2020-07-06 DIAGNOSIS — Z8619 Personal history of other infectious and parasitic diseases: Secondary | ICD-10-CM

## 2020-07-06 DIAGNOSIS — M4326 Fusion of spine, lumbar region: Secondary | ICD-10-CM | POA: Diagnosis not present

## 2020-07-06 DIAGNOSIS — M5116 Intervertebral disc disorders with radiculopathy, lumbar region: Secondary | ICD-10-CM | POA: Diagnosis present

## 2020-07-06 DIAGNOSIS — Z833 Family history of diabetes mellitus: Secondary | ICD-10-CM | POA: Diagnosis not present

## 2020-07-06 DIAGNOSIS — Z419 Encounter for procedure for purposes other than remedying health state, unspecified: Secondary | ICD-10-CM

## 2020-07-06 LAB — GLUCOSE, CAPILLARY: Glucose-Capillary: 132 mg/dL — ABNORMAL HIGH (ref 70–99)

## 2020-07-06 SURGERY — POSTERIOR LUMBAR FUSION 1 LEVEL
Anesthesia: General

## 2020-07-06 MED ORDER — EZETIMIBE 10 MG PO TABS
10.0000 mg | ORAL_TABLET | Freq: Every day | ORAL | Status: DC
  Administered 2020-07-06: 10 mg via ORAL
  Filled 2020-07-06 (×2): qty 1

## 2020-07-06 MED ORDER — CYCLOBENZAPRINE HCL 10 MG PO TABS
10.0000 mg | ORAL_TABLET | Freq: Three times a day (TID) | ORAL | Status: DC | PRN
  Administered 2020-07-06 – 2020-07-07 (×3): 10 mg via ORAL
  Filled 2020-07-06 (×2): qty 1

## 2020-07-06 MED ORDER — 0.9 % SODIUM CHLORIDE (POUR BTL) OPTIME
TOPICAL | Status: DC | PRN
  Administered 2020-07-06: 1000 mL

## 2020-07-06 MED ORDER — THROMBIN 5000 UNITS EX SOLR
CUTANEOUS | Status: AC
  Filled 2020-07-06: qty 5000

## 2020-07-06 MED ORDER — OXYCODONE HCL 5 MG PO TABS
ORAL_TABLET | ORAL | Status: AC
  Filled 2020-07-06: qty 1

## 2020-07-06 MED ORDER — CYCLOBENZAPRINE HCL 10 MG PO TABS
ORAL_TABLET | ORAL | Status: AC
  Filled 2020-07-06: qty 1

## 2020-07-06 MED ORDER — LIDOCAINE 2% (20 MG/ML) 5 ML SYRINGE
INTRAMUSCULAR | Status: DC | PRN
  Administered 2020-07-06: 100 mg via INTRAVENOUS

## 2020-07-06 MED ORDER — HYDROMORPHONE HCL 1 MG/ML IJ SOLN
INTRAMUSCULAR | Status: AC
  Filled 2020-07-06: qty 1

## 2020-07-06 MED ORDER — ORAL CARE MOUTH RINSE: 15.0000 mL | Freq: Once | OROMUCOSAL | Status: AC

## 2020-07-06 MED ORDER — CHLORHEXIDINE GLUCONATE CLOTH 2 % EX PADS: 6.0000 | MEDICATED_PAD | Freq: Once | CUTANEOUS | Status: DC

## 2020-07-06 MED ORDER — FENTANYL CITRATE (PF) 250 MCG/5ML IJ SOLN
INTRAMUSCULAR | Status: DC | PRN
  Administered 2020-07-06 (×2): 50 ug via INTRAVENOUS
  Administered 2020-07-06: 100 ug via INTRAVENOUS
  Administered 2020-07-06: 50 ug via INTRAVENOUS

## 2020-07-06 MED ORDER — SODIUM CHLORIDE 0.9% FLUSH: 3.0000 mL | INTRAVENOUS | Status: DC | PRN

## 2020-07-06 MED ORDER — ISOSORBIDE MONONITRATE ER 30 MG PO TB24
30.0000 mg | ORAL_TABLET | Freq: Every day | ORAL | Status: DC
  Filled 2020-07-06 (×2): qty 1

## 2020-07-06 MED ORDER — EPHEDRINE SULFATE-NACL 50-0.9 MG/10ML-% IV SOSY
PREFILLED_SYRINGE | INTRAVENOUS | Status: DC | PRN
  Administered 2020-07-06: 10 mg via INTRAVENOUS
  Administered 2020-07-06: 15 mg via INTRAVENOUS

## 2020-07-06 MED ORDER — ACETAMINOPHEN 650 MG RE SUPP: 650.0000 mg | RECTAL | Status: DC | PRN

## 2020-07-06 MED ORDER — EPHEDRINE 5 MG/ML INJ
INTRAVENOUS | Status: AC
  Filled 2020-07-06: qty 10

## 2020-07-06 MED ORDER — OXYCODONE HCL 5 MG PO TABS
5.0000 mg | ORAL_TABLET | ORAL | Status: DC | PRN
  Administered 2020-07-06 (×2): 5 mg via ORAL
  Filled 2020-07-06: qty 1

## 2020-07-06 MED ORDER — SUGAMMADEX SODIUM 200 MG/2ML IV SOLN
INTRAVENOUS | Status: DC | PRN
  Administered 2020-07-06: 200 mg via INTRAVENOUS

## 2020-07-06 MED ORDER — ACETAMINOPHEN 10 MG/ML IV SOLN
INTRAVENOUS | Status: AC
  Filled 2020-07-06: qty 100

## 2020-07-06 MED ORDER — FENTANYL CITRATE (PF) 250 MCG/5ML IJ SOLN
INTRAMUSCULAR | Status: AC
  Filled 2020-07-06: qty 5

## 2020-07-06 MED ORDER — THROMBIN 5000 UNITS EX SOLR
OROMUCOSAL | Status: DC | PRN
  Administered 2020-07-06: 5 mL via TOPICAL

## 2020-07-06 MED ORDER — ONDANSETRON HCL 4 MG/2ML IJ SOLN: 4.0000 mg | Freq: Four times a day (QID) | INTRAMUSCULAR | Status: DC | PRN

## 2020-07-06 MED ORDER — CHLORHEXIDINE GLUCONATE 0.12 % MT SOLN
15.0000 mL | Freq: Once | OROMUCOSAL | Status: AC
  Administered 2020-07-06: 15 mL via OROMUCOSAL
  Filled 2020-07-06: qty 15

## 2020-07-06 MED ORDER — ROCURONIUM BROMIDE 10 MG/ML (PF) SYRINGE
PREFILLED_SYRINGE | INTRAVENOUS | Status: AC
  Filled 2020-07-06: qty 20

## 2020-07-06 MED ORDER — HYDROMORPHONE HCL 1 MG/ML IJ SOLN
0.2500 mg | INTRAMUSCULAR | Status: DC | PRN
  Administered 2020-07-06 (×2): 0.5 mg via INTRAVENOUS

## 2020-07-06 MED ORDER — DOCUSATE SODIUM 100 MG PO CAPS
100.0000 mg | ORAL_CAPSULE | Freq: Two times a day (BID) | ORAL | Status: DC
  Administered 2020-07-06: 100 mg via ORAL
  Filled 2020-07-06: qty 1

## 2020-07-06 MED ORDER — BUPIVACAINE-EPINEPHRINE 0.5% -1:200000 IJ SOLN
INTRAMUSCULAR | Status: AC
  Filled 2020-07-06: qty 1

## 2020-07-06 MED ORDER — ACETAMINOPHEN 325 MG PO TABS
650.0000 mg | ORAL_TABLET | ORAL | Status: DC | PRN
  Administered 2020-07-07: 650 mg via ORAL
  Filled 2020-07-06: qty 2

## 2020-07-06 MED ORDER — BUPIVACAINE LIPOSOME 1.3 % IJ SUSP
20.0000 mL | Freq: Once | INTRAMUSCULAR | Status: AC
  Administered 2020-07-06: 20 mL
  Filled 2020-07-06: qty 20

## 2020-07-06 MED ORDER — GABAPENTIN 300 MG PO CAPS
300.0000 mg | ORAL_CAPSULE | Freq: Three times a day (TID) | ORAL | Status: DC
  Administered 2020-07-07: 300 mg via ORAL
  Filled 2020-07-06: qty 1

## 2020-07-06 MED ORDER — ACETAMINOPHEN 500 MG PO TABS
1000.0000 mg | ORAL_TABLET | Freq: Four times a day (QID) | ORAL | Status: AC
  Administered 2020-07-06 – 2020-07-07 (×4): 1000 mg via ORAL
  Filled 2020-07-06 (×4): qty 2

## 2020-07-06 MED ORDER — OXYCODONE HCL 5 MG PO TABS
10.0000 mg | ORAL_TABLET | ORAL | Status: DC | PRN
  Administered 2020-07-06 – 2020-07-07 (×5): 10 mg via ORAL
  Filled 2020-07-06 (×5): qty 2

## 2020-07-06 MED ORDER — BACITRACIN ZINC 500 UNIT/GM EX OINT
TOPICAL_OINTMENT | CUTANEOUS | Status: DC | PRN
  Administered 2020-07-06: 1 via TOPICAL

## 2020-07-06 MED ORDER — RANOLAZINE ER 500 MG PO TB12
500.0000 mg | ORAL_TABLET | Freq: Every evening | ORAL | Status: DC
  Filled 2020-07-06 (×2): qty 1

## 2020-07-06 MED ORDER — TAMSULOSIN HCL 0.4 MG PO CAPS: 0.4000 mg | ORAL_CAPSULE | Freq: Every day | ORAL | Status: DC

## 2020-07-06 MED ORDER — ONDANSETRON HCL 4 MG PO TABS: 4.0000 mg | ORAL_TABLET | Freq: Four times a day (QID) | ORAL | Status: DC | PRN

## 2020-07-06 MED ORDER — CEFAZOLIN SODIUM-DEXTROSE 2-4 GM/100ML-% IV SOLN
2.0000 g | Freq: Three times a day (TID) | INTRAVENOUS | Status: AC
  Administered 2020-07-06 (×2): 2 g via INTRAVENOUS
  Filled 2020-07-06 (×2): qty 100

## 2020-07-06 MED ORDER — MENTHOL 3 MG MT LOZG: 1.0000 | LOZENGE | OROMUCOSAL | Status: DC | PRN

## 2020-07-06 MED ORDER — PHENOL 1.4 % MT LIQD: 1.0000 | OROMUCOSAL | Status: DC | PRN

## 2020-07-06 MED ORDER — CEFAZOLIN SODIUM-DEXTROSE 2-4 GM/100ML-% IV SOLN
2.0000 g | INTRAVENOUS | Status: AC
  Administered 2020-07-06: 2 g via INTRAVENOUS
  Filled 2020-07-06: qty 100

## 2020-07-06 MED ORDER — ACETAMINOPHEN 10 MG/ML IV SOLN
INTRAVENOUS | Status: DC | PRN
  Administered 2020-07-06: 1000 mg via INTRAVENOUS

## 2020-07-06 MED ORDER — LOSARTAN POTASSIUM 50 MG PO TABS
25.0000 mg | ORAL_TABLET | Freq: Every day | ORAL | Status: DC
  Administered 2020-07-07: 25 mg via ORAL
  Filled 2020-07-06: qty 1

## 2020-07-06 MED ORDER — ROCURONIUM BROMIDE 10 MG/ML (PF) SYRINGE
PREFILLED_SYRINGE | INTRAVENOUS | Status: DC | PRN
  Administered 2020-07-06: 80 mg via INTRAVENOUS
  Administered 2020-07-06 (×3): 20 mg via INTRAVENOUS

## 2020-07-06 MED ORDER — FESOTERODINE FUMARATE ER 8 MG PO TB24
8.0000 mg | ORAL_TABLET | Freq: Every day | ORAL | Status: DC
  Filled 2020-07-06: qty 1

## 2020-07-06 MED ORDER — METOPROLOL SUCCINATE ER 25 MG PO TB24
12.5000 mg | ORAL_TABLET | Freq: Every day | ORAL | Status: DC
  Administered 2020-07-07: 12.5 mg via ORAL
  Filled 2020-07-06: qty 1

## 2020-07-06 MED ORDER — DEXAMETHASONE SODIUM PHOSPHATE 10 MG/ML IJ SOLN
INTRAMUSCULAR | Status: AC
  Filled 2020-07-06: qty 1

## 2020-07-06 MED ORDER — SIMVASTATIN 20 MG PO TABS: 40.0000 mg | ORAL_TABLET | Freq: Every evening | ORAL | Status: DC

## 2020-07-06 MED ORDER — DEXAMETHASONE SODIUM PHOSPHATE 10 MG/ML IJ SOLN
INTRAMUSCULAR | Status: DC | PRN
  Administered 2020-07-06: 10 mg via INTRAVENOUS

## 2020-07-06 MED ORDER — ALBUTEROL SULFATE HFA 108 (90 BASE) MCG/ACT IN AERS
2.0000 | INHALATION_SPRAY | Freq: Four times a day (QID) | RESPIRATORY_TRACT | Status: DC | PRN
  Filled 2020-07-06: qty 6.7

## 2020-07-06 MED ORDER — FLUTICASONE PROPIONATE 50 MCG/ACT NA SUSP
2.0000 | Freq: Every day | NASAL | Status: DC | PRN
  Filled 2020-07-06: qty 16

## 2020-07-06 MED ORDER — PROPOFOL 10 MG/ML IV BOLUS
INTRAVENOUS | Status: AC
  Filled 2020-07-06: qty 20

## 2020-07-06 MED ORDER — INSULIN ASPART 100 UNIT/ML ~~LOC~~ SOLN: 0.0000 [IU] | Freq: Three times a day (TID) | SUBCUTANEOUS | Status: DC

## 2020-07-06 MED ORDER — LIDOCAINE HCL (PF) 2 % IJ SOLN
INTRAMUSCULAR | Status: AC
  Filled 2020-07-06: qty 5

## 2020-07-06 MED ORDER — ONDANSETRON HCL 4 MG/2ML IJ SOLN
INTRAMUSCULAR | Status: AC
  Filled 2020-07-06: qty 2

## 2020-07-06 MED ORDER — BACITRACIN ZINC 500 UNIT/GM EX OINT
TOPICAL_OINTMENT | CUTANEOUS | Status: AC
  Filled 2020-07-06: qty 28.35

## 2020-07-06 MED ORDER — BISACODYL 10 MG RE SUPP: 10.0000 mg | Freq: Every day | RECTAL | Status: DC | PRN

## 2020-07-06 MED ORDER — PANTOPRAZOLE SODIUM 40 MG PO TBEC
40.0000 mg | DELAYED_RELEASE_TABLET | Freq: Every day | ORAL | Status: DC
  Administered 2020-07-07: 40 mg via ORAL
  Filled 2020-07-06: qty 1

## 2020-07-06 MED ORDER — SODIUM CHLORIDE 0.9% FLUSH
3.0000 mL | Freq: Two times a day (BID) | INTRAVENOUS | Status: DC
  Administered 2020-07-06 – 2020-07-07 (×2): 3 mL via INTRAVENOUS

## 2020-07-06 MED ORDER — HYDROMORPHONE HCL 1 MG/ML IJ SOLN: 0.5000 mg | INTRAMUSCULAR | Status: DC | PRN

## 2020-07-06 MED ORDER — PROPOFOL 10 MG/ML IV BOLUS
INTRAVENOUS | Status: DC | PRN
  Administered 2020-07-06: 100 mg via INTRAVENOUS

## 2020-07-06 MED ORDER — FUROSEMIDE 20 MG PO TABS: 20.0000 mg | ORAL_TABLET | Freq: Every day | ORAL | Status: DC | PRN

## 2020-07-06 MED ORDER — NITROGLYCERIN 0.4 MG SL SUBL: 0.4000 mg | SUBLINGUAL_TABLET | SUBLINGUAL | Status: DC | PRN

## 2020-07-06 MED ORDER — SODIUM CHLORIDE 0.9 % IV SOLN: INTRAVENOUS | Status: DC | PRN

## 2020-07-06 MED ORDER — BUPIVACAINE-EPINEPHRINE (PF) 0.5% -1:200000 IJ SOLN
INTRAMUSCULAR | Status: DC | PRN
  Administered 2020-07-06: 10 mL

## 2020-07-06 MED ORDER — PHENYLEPHRINE HCL-NACL 10-0.9 MG/250ML-% IV SOLN
INTRAVENOUS | Status: DC | PRN
  Administered 2020-07-06: 20 ug/min via INTRAVENOUS

## 2020-07-06 MED ORDER — ONDANSETRON HCL 4 MG/2ML IJ SOLN
INTRAMUSCULAR | Status: DC | PRN
  Administered 2020-07-06: 4 mg via INTRAVENOUS

## 2020-07-06 MED ORDER — POLYETHYLENE GLYCOL 3350 17 G PO PACK: 17.0000 g | PACK | Freq: Every day | ORAL | Status: DC | PRN

## 2020-07-06 MED ORDER — SODIUM CHLORIDE 0.9 % IV SOLN
250.0000 mL | INTRAVENOUS | Status: DC
  Administered 2020-07-06: 250 mL via INTRAVENOUS

## 2020-07-06 MED ORDER — LACTATED RINGERS IV SOLN: INTRAVENOUS | Status: DC

## 2020-07-06 SURGICAL SUPPLY — 62 items
APL SKNCLS STERI-STRIP NONHPOA (GAUZE/BANDAGES/DRESSINGS) ×1
BENZOIN TINCTURE PRP APPL 2/3 (GAUZE/BANDAGES/DRESSINGS) ×2 IMPLANT
BLADE CLIPPER SURG (BLADE) IMPLANT
BUR MATCHSTICK NEURO 3.0 LAGG (BURR) ×2 IMPLANT
BUR PRECISION FLUTE 6.0 (BURR) ×2 IMPLANT
CAGE ALTERA 10X31X10-14 15D (Cage) ×1 IMPLANT
CANISTER SUCT 3000ML PPV (MISCELLANEOUS) ×2 IMPLANT
CAP LOCK DLX THRD (Cap) ×4 IMPLANT
CARTRIDGE OIL MAESTRO DRILL (MISCELLANEOUS) ×1 IMPLANT
CNTNR URN SCR LID CUP LEK RST (MISCELLANEOUS) ×1 IMPLANT
CONT SPEC 4OZ STRL OR WHT (MISCELLANEOUS) ×2
COVER BACK TABLE 60X90IN (DRAPES) ×2 IMPLANT
COVER WAND RF STERILE (DRAPES) ×2 IMPLANT
DECANTER SPIKE VIAL GLASS SM (MISCELLANEOUS) ×2 IMPLANT
DIFFUSER DRILL AIR PNEUMATIC (MISCELLANEOUS) ×2 IMPLANT
DRAPE C-ARM 42X72 X-RAY (DRAPES) ×4 IMPLANT
DRAPE HALF SHEET 40X57 (DRAPES) ×2 IMPLANT
DRAPE LAPAROTOMY 100X72X124 (DRAPES) ×2 IMPLANT
DRAPE SURG 17X23 STRL (DRAPES) ×8 IMPLANT
DRSG OPSITE POSTOP 4X6 (GAUZE/BANDAGES/DRESSINGS) ×2 IMPLANT
ELECT BLADE 4.0 EZ CLEAN MEGAD (MISCELLANEOUS) ×2
ELECT REM PT RETURN 9FT ADLT (ELECTROSURGICAL) ×2
ELECTRODE BLDE 4.0 EZ CLN MEGD (MISCELLANEOUS) ×1 IMPLANT
ELECTRODE REM PT RTRN 9FT ADLT (ELECTROSURGICAL) ×1 IMPLANT
EVACUATOR 1/8 PVC DRAIN (DRAIN) IMPLANT
GAUZE 4X4 16PLY RFD (DISPOSABLE) ×2 IMPLANT
GLOVE BIO SURGEON STRL SZ8 (GLOVE) ×4 IMPLANT
GLOVE BIO SURGEON STRL SZ8.5 (GLOVE) ×4 IMPLANT
GLOVE EXAM NITRILE XL STR (GLOVE) IMPLANT
GOWN STRL REUS W/ TWL LRG LVL3 (GOWN DISPOSABLE) IMPLANT
GOWN STRL REUS W/ TWL XL LVL3 (GOWN DISPOSABLE) ×2 IMPLANT
GOWN STRL REUS W/TWL 2XL LVL3 (GOWN DISPOSABLE) IMPLANT
GOWN STRL REUS W/TWL LRG LVL3 (GOWN DISPOSABLE)
GOWN STRL REUS W/TWL XL LVL3 (GOWN DISPOSABLE) ×4
HEMOSTAT POWDER KIT SURGIFOAM (HEMOSTASIS) ×2 IMPLANT
KIT BASIN OR (CUSTOM PROCEDURE TRAY) ×2 IMPLANT
KIT TURNOVER KIT B (KITS) ×2 IMPLANT
MILL MEDIUM DISP (BLADE) ×2 IMPLANT
NDL HYPO 21X1.5 SAFETY (NEEDLE) IMPLANT
NEEDLE HYPO 21X1.5 SAFETY (NEEDLE) IMPLANT
NEEDLE HYPO 22GX1.5 SAFETY (NEEDLE) ×2 IMPLANT
NS IRRIG 1000ML POUR BTL (IV SOLUTION) ×2 IMPLANT
OIL CARTRIDGE MAESTRO DRILL (MISCELLANEOUS) ×2
PACK LAMINECTOMY NEURO (CUSTOM PROCEDURE TRAY) ×2 IMPLANT
PAD ARMBOARD 7.5X6 YLW CONV (MISCELLANEOUS) ×6 IMPLANT
PATTIES SURGICAL .5 X1 (DISPOSABLE) IMPLANT
PUTTY DBM 10CC CALC GRAN (Putty) ×1 IMPLANT
ROD CURVED TI 6.35X45 (Rod) ×2 IMPLANT
SCREW PA DLX CREO 7.5X50 (Screw) ×2 IMPLANT
SCREW PA DLX CREO 7.5X55 (Screw) ×2 IMPLANT
SPONGE LAP 4X18 RFD (DISPOSABLE) IMPLANT
SPONGE NEURO XRAY DETECT 1X3 (DISPOSABLE) IMPLANT
SPONGE SURGIFOAM ABS GEL 100 (HEMOSTASIS) IMPLANT
STRIP CLOSURE SKIN 1/2X4 (GAUZE/BANDAGES/DRESSINGS) ×2 IMPLANT
SUT VIC AB 1 CT1 18XBRD ANBCTR (SUTURE) ×2 IMPLANT
SUT VIC AB 1 CT1 8-18 (SUTURE) ×4
SUT VIC AB 2-0 CP2 18 (SUTURE) ×4 IMPLANT
SYR 20ML LL LF (SYRINGE) IMPLANT
TOWEL GREEN STERILE (TOWEL DISPOSABLE) ×2 IMPLANT
TOWEL GREEN STERILE FF (TOWEL DISPOSABLE) ×2 IMPLANT
TRAY FOLEY MTR SLVR 16FR STAT (SET/KITS/TRAYS/PACK) ×2 IMPLANT
WATER STERILE IRR 1000ML POUR (IV SOLUTION) ×2 IMPLANT

## 2020-07-06 NOTE — Op Note (Signed)
Brief history: The patient is a 79 year old white male whose had a previous lumbar laminectomy.  He has developed recurrent back and leg pain.  He has failed medical management.  He was worked up with a lumbar MRI and lumbar x-rays which demonstrated an L4-5 spondylolisthesis, recurrent herniated disc, stenosis, foraminal stenosis, etc.  I discussed the various treatment options with him.  He has decided proceed with surgery after weighing the risk, benefits and alternatives.  Preoperative diagnosis: L4-5 spondylolisthesis, degenerative disc disease, spinal stenosis compressing both the L4 and the L5 nerve roots; lumbago; lumbar radiculopathy; neurogenic claudication  Postoperative diagnosis: The same  Procedure: Bilateral redo laminectomy/laminotomy/foraminotomies/medial facetectomy to decompress the bilateral L4 and L5 nerve roots(the work required to do this was in addition to the work required to do the posterior lumbar interbody fusion because of the patient's spinal stenosis, facet arthropathy. Etc. requiring a wide decompression of the nerve roots.);  L4-5 transforaminal lumbar interbody fusion with local morselized autograft bone and Zimmer DBM; insertion of interbody prosthesis at L4-5 (globus peek expandable interbody prosthesis); posterior nonsegmental instrumentation from L4 to L5 with globus titanium pedicle screws and rods; posterior lateral arthrodesis at L4-5 with local morselized autograft bone and Zimmer DBM.  Surgeon: Dr. Earle Gell  Asst.: Dr. Kristeen Miss  Anesthesia: Gen. endotracheal  Estimated blood loss: 250 cc  Drains: None  Complications: None  Description of procedure: The patient was brought to the operating room by the anesthesia team. General endotracheal anesthesia was induced. The patient was turned to the prone position on the Wilson frame. The patient's lumbosacral region was then prepared with Betadine scrub and Betadine solution. Sterile drapes were  applied.  I then injected the area to be incised with Marcaine with epinephrine solution. I then used the scalpel to make a linear midline incision over the L4-5 interspace. I then used electrocautery to perform a bilateral subperiosteal dissection exposing the remainder of the lamina of L4 and L5. We then obtained intraoperative radiograph to confirm our location. We then inserted the Verstrac retractor to provide exposure.  I began the decompression by using the high speed drill to widen the patient's previous laminectomy at L4-5. We then used the Kerrison punches to widen the laminotomy and removed the manger of ligamentum flavum and the scar tissue at 4 5 bilaterally. We used the Kerrison punches to remove the medial facets at L4-5 bilaterally, we removed the left L4-5 facet. We performed wide foraminotomies about the bilateral L4 and L5 nerve roots completing the decompression.  We now turned our attention to the posterior lumbar interbody fusion. I used a scalpel to incise the intervertebral disc at L4-5 bilaterally. I then performed a partial intervertebral discectomy at L4-5 bilaterally using the pituitary forceps. We prepared the vertebral endplates at F3-5 bilaterally for the fusion by removing the soft tissues with the curettes. We then used the trial spacers to pick the appropriate sized interbody prosthesis. We prefilled his prosthesis with a combination of local morselized autograft bone that we obtained during the decompression as well as Zimmer DBM. We inserted the prefilled prosthesis into the interspace at L4-5 from the left, we then turned and expanded the prosthesis. There was a good snug fit of the prosthesis in the interspace. We then filled and the remainder of the intervertebral disc space with local morselized autograft bone and Zimmer DBM. This completed the posterior lumbar interbody arthrodesis.  During the decompression and insertion of the prosthesis the assistant protected the  thecal sac and nerve  roots with the D'Errico retractor.  We now turned attention to the instrumentation. Under fluoroscopic guidance we cannulated the bilateral L4 and L5 pedicles with the bone probe. We then removed the bone probe. We then tapped the pedicle with a 6.5 millimeter tap. We then removed the tap. We probed inside the tapped pedicle with a ball probe to rule out cortical breaches. We then inserted a 7.5 x 50 and 55 millimeter pedicle screw into the L4 and L5 pedicles bilaterally under fluoroscopic guidance. We then palpated along the medial aspect of the pedicles to rule out cortical breaches. There were none. The nerve roots were not injured. We then connected the unilateral pedicle screws with a lordotic rod. We compressed the construct and secured the rod in place with the caps. We then tightened the caps appropriately. This completed the instrumentation from L4-5 bilaterally.  We now turned our attention to the posterior lateral arthrodesis at L4-5 bilaterally. We used the high-speed drill to decorticate the remainder of the facets, pars, transverse process at L4-5 bilaterally. We then applied a combination of local morselized autograft bone and Zimmer DBM over these decorticated posterior lateral structures. This completed the posterior lateral arthrodesis.  We then obtained hemostasis using bipolar electrocautery. We irrigated the wound out with bacitracin solution. We inspected the thecal sac and nerve roots and noted they were well decompressed. We then removed the retractor.  We injected Exparel . We reapproximated patient's thoracolumbar fascia with interrupted #1 Vicryl suture. We reapproximated patient's subcutaneous tissue with interrupted 2-0 Vicryl suture. The reapproximated patient's skin with Steri-Strips and benzoin. The wound was then coated with bacitracin ointment. A sterile dressing was applied. The drapes were removed. The patient was subsequently returned to the supine  position where they were extubated by the anesthesia team. He was then transported to the post anesthesia care unit in stable condition. All sponge instrument and needle counts were reportedly correct at the end of this case.

## 2020-07-06 NOTE — Evaluation (Signed)
Physical Therapy Evaluation Patient Details Name: [name] MRN: [id number] DOB: 12/19/1940 Today's Date: 07/06/2020   History of Present Illness  Patient is a 79 y/o male with PMH of CAD s/p CABG 1990, CHF, chronic prostatitis, DVT, emphysema, HLD, HTN, PAD, OSA, pre-diabetes. Patient has history of lumbar laminectomies. Patient with ongoing and worsening back and leg pain. Patient s/p posterior lumbar fusion L4-5 on 11/22.  Clinical Impression  Prior to surgery, patient was independent with all mobility and lives with wife. Patient presents with decreased activity tolerance, generalized weakness, impaired balance. Overall, patient required min guard for safety with mobility. Patient ambulated 150' with RW and min guard. Educated patient on log roll technique and strategies to assist with adhering to back precautions. Patient demonstrated ability to maintain back precautions throughout session. Patient will benefit from skilled PT services to address listed deficits. Recommend HHPT following discharge to maximize functional mobility and independence.     Follow Up Recommendations Home health PT;Supervision for mobility/OOB    Equipment Recommendations  3in1 (PT)    Recommendations for Other Services       Precautions / Restrictions Precautions Precautions: Back;Fall Precaution Booklet Issued: Yes (comment) Required Braces or Orthoses: Spinal Brace Spinal Brace: Lumbar corset Restrictions Weight Bearing Restrictions: No      Mobility  Bed Mobility Overal bed mobility: Needs Assistance Bed Mobility: Supine to Sit;Sit to Supine     Supine to sit: Min guard Sit to supine: Min guard   General bed mobility comments: min guard for safety and tactile cueing for log roll technique    Transfers Overall transfer level: Needs assistance Equipment used: Rolling Carrie Schoonmaker (2 wheeled) Transfers: Sit to/from Stand Sit to Stand: Min guard             Ambulation/Gait Ambulation/Gait assistance: Min guard Gait Distance (Feet): 150 Feet Assistive device: Rolling Khalea Ventura (2 wheeled) Gait Pattern/deviations: Step-through pattern;Decreased stride length;Wide base of support     General Gait Details: patient with self awareness of need for upright posture  Stairs            Wheelchair Mobility    Modified Rankin (Stroke Patients Only)       Balance Overall balance assessment: Needs assistance Sitting-balance support: No upper extremity supported;Feet supported Sitting balance-Leahy Scale: Fair     Standing balance support: Bilateral upper extremity supported;During functional activity Standing balance-Leahy Scale: Poor                               Pertinent Vitals/Pain Pain Assessment: Faces Faces Pain Scale: Hurts little more Pain Location: low back Pain Descriptors / Indicators: Grimacing;Guarding Pain Intervention(s): Monitored during session    Home Living Family/patient expects to be discharged to:: Private residence Living Arrangements: Spouse/significant other Available Help at Discharge: Family;Available 24 hours/day Type of Home: House Home Access: Stairs to enter Entrance Stairs-Rails: Can reach both Entrance Stairs-Number of Steps: 3 Home Layout: One level Home Equipment: Shaquanta Harkless - 2 wheels;Rohn Fritsch - 4 wheels;Shower seat      Prior Function Level of Independence: Independent               Hand Dominance        Extremity/Trunk Assessment   Upper Extremity Assessment Upper Extremity Assessment: Defer to OT evaluation    Lower Extremity Assessment Lower Extremity Assessment: Generalized weakness       Communication   Communication: No difficulties  Cognition Arousal/Alertness: Awake/alert Behavior During Therapy: [facility]  for tasks assessed/performed Overall Cognitive Status: Within Functional Limits for tasks assessed                                         General Comments      Exercises     Assessment/Plan    PT Assessment Patient needs continued PT services  PT Problem List Decreased strength;Decreased activity tolerance;Decreased balance;Decreased mobility;Pain       PT Treatment Interventions DME instruction;Gait training;Stair training;Functional mobility training;Therapeutic activities;Therapeutic exercise;Balance training;Patient/family education    PT Goals (Current goals can be found in the Care Plan section)  Acute Rehab PT Goals Patient Stated Goal: to go home PT Goal Formulation: With patient Time For Goal Achievement: 07/13/20 Potential to Achieve Goals: Good    Frequency Min 5X/week   Barriers to discharge        Co-evaluation               AM-PAC PT "6 Clicks" Mobility  Outcome Measure Help needed turning from your back to your side while in a flat bed without using bedrails?: A Little Help needed moving from lying on your back to sitting on the side of a flat bed without using bedrails?: A Little Help needed moving to and from a bed to a chair (including a wheelchair)?: A Little Help needed standing up from a chair using your arms (e.g., wheelchair or bedside chair)?: A Little Help needed to walk in hospital room?: A Little Help needed climbing 3-5 steps with a railing? : A Little 6 Click Score: 18    End of Session Equipment Utilized During Treatment: Gait belt;Back brace Activity Tolerance: Patient tolerated treatment well Patient left: in bed;with call bell/phone within reach Nurse Communication: Mobility status PT Visit Diagnosis: Unsteadiness on feet (R26.81);Muscle weakness (generalized) (M62.81);Other abnormalities of gait and mobility (R26.89)    Time: 9622-2979 PT Time Calculation (min) (ACUTE ONLY): 29 min   Charges:   PT Evaluation $PT Eval Low Complexity: 1 Low PT Treatments $Therapeutic Activity: 8-22 mins        Perrin Maltese, PT, DPT Acute Rehabilitation  Services Pager [phone] Office [phone]   Melene Plan Allred 07/06/2020, 5:46 PM

## 2020-07-06 NOTE — Transfer of Care (Signed)
Immediate Anesthesia Transfer of Care Note  Patient: [name]  Procedure(s) Performed: POSTERIOR LUMBAR INTERBODY FUSION, INTERBODY PROSTHESIS, POSTERIOR INSTRUMENTATION LUMBAR FOUR-LUMBAR FIVE ([name] )  Patient Location: PACU  Anesthesia Type:General  Level of Consciousness: drowsy, patient cooperative and responds to stimulation  Airway & Oxygen Therapy: Patient Spontanous Breathing and Patient connected to nasal cannula oxygen  Post-op Assessment: Report given to RN and Post -op Vital signs reviewed and stable  Post vital signs: Reviewed and stable  Last Vitals:  Vitals Value Taken Time  BP 142/64 07/06/20 1206  Temp    Pulse 79 07/06/20 1207  Resp 19 07/06/20 1207  SpO2 97 % 07/06/20 1207  Vitals shown include unvalidated device data.  Last Pain:  Vitals:   07/06/20 0612  TempSrc:   PainSc: 0-No pain         Complications: No complications documented.

## 2020-07-06 NOTE — H&P (Signed)
Subjective: The patient is a 79 year old white male who has had previous back surgery.  He has developed recurrent back and leg pain consistent with neurogenic claudication/lumbar radiculopathy.  He has failed medical management.  He was worked up with a lumbar MRI which demonstrated an L4-5 spinal stenosis, herniated disc, etc.  I discussed the various treatment options with him.  He has decided proceed with surgery.  Past Medical History:  Diagnosis Date  . Actinic keratosis   . Anginal pain ([place])   . BENIGN PROSTATIC HYPERTROPHY, WITH URINARY OBSTRUCTION 09/06/2007  . CAD s/p CABG    a. 1990 s/p MI-->CABG x 2; b. 2002 s/p BMS to LCX; c. 06/2015 Cath: LM 50ost, LAD 100ost/p, 100m/d, D2 nl, LCX  patent stent, RCA 80p (small), LIMA->LAD nl, VG->D2 nl-->Med Rx.  . Carotid arterial disease ([place])    a. 08/2016 Carotid U/S: <39% bilat.  . CHF (congestive heart failure) ([place])   . 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 Community acquired pneumonia of right lower lobe of lung 06/05/2017  . COVID-19 virus infection 05/30/2019   Covid PNA with hospitalization 05/2019  . 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 DVT, HX OF 1998  . Emphysema of lung ([place])   . GERD 04/30/2007  . Headache    hx migraines  . History of hiatal hernia   . History of shingles   . HYPERLIPIDEMIA 04/26/2007  . HYPERTENSION 04/30/2007  . INGUINAL HERNIA, RIGHT 05/26/2010  . Laceration of skin of left hand 03/08/2018  . Lumbar disc disease with radiculopathy    neuropathy in feet  . Myocardial infarction ([place])    1989, 2002  . OSA (obstructive sleep apnea)    a. did not tolerate CPAP.  Marland Kitchen Osteoarthritis   . PAD (peripheral artery disease) ([place])    a. 07/2017 LE duplex: RSFA 75-3m, LSFA 75-19m, 50-74d.  . Pneumonia 07/2014   [facility] hospitalization  . Pneumonia due to COVID-19 virus 06/02/2019  . Pre-diabetes   . PSA, INCREASED 07/09/2008  . Spinal stenosis of lumbar region     Past Surgical History:  Procedure Laterality  Date  . ABDOMINAL AORTOGRAM W/LOWER EXTREMITY [name] 09/12/2018   Procedure: ABDOMINAL AORTOGRAM W/LOWER EXTREMITY;  Surgeon: [name], MD;  Location: [facility];  Service: Cardiovascular;  Laterality: [name];  . APPENDECTOMY     rupture  . [place] then [place]  . CARDIAC CATHETERIZATION [name] 07/07/2015   Procedure: Left Heart Cath and Cors/Grafts Angiography;  Surgeon: [name], MD;  Location: [facility];  Service: Cardiovascular;  Laterality: [name];  . CATARACT EXTRACTION, BILATERAL    . CERVICAL SPINE SURGERY  12/2016   cervical stenosis [name])  . COLONOSCOPY  03/2010   HP polyp, diverticulosis, rpt 10 yrs ([name])  . CORONARY ARTERY BYPASS GRAFT  1990   3 vessel   . CYSTOSCOPY  12/23/10   [facility]  . EYE SURGERY     cataract  . JOINT REPLACEMENT    . KNEE ARTHROSCOPY Right    x2  . LAMINOTOMY  1986   L5/S1 lumbar laminotomy for two ruptured discs/fusion  . LUMBAR LAMINECTOMY/DECOMPRESSION MICRODISCECTOMY [name] 07/13/2016   Procedure: LUMBAR TWO-THREE, LUMBAR THREE-FOUR, LUMBAR FOUR-FIVE LAMINECTOMY AND FORAMINOTOMY;  Surgeon: [name], MD;  Location: [facility];  Service: Neurosurgery;  Laterality: [name];  LAMINECTOMY AND FORAMINOTOMY L2-L3, L3-L4,L4-L5  . TOTAL HIP ARTHROPLASTY Bilateral 1999,2000  . TOTAL KNEE ARTHROPLASTY Right 03/06/2017   Procedure: RIGHT  TOTAL KNEE ARTHROPLASTY;  Surgeon: Gaynelle Arabian, MD;  Location: [facility];  Service: Orthopedics;  Laterality: Right;    Allergies  Allergen Reactions  . Vioxx [Rofecoxib] Other (See Comments)    Hemorrhage   . Spiriva Respimat [Tiotropium Bromide Monohydrate] Other (See Comments)    Elevated bp  . Contrast Media [Iodinated Diagnostic Agents] Itching and Rash    Delayed reaction post abdominal aortagram.   . Morphine Nausea Only and Other (See Comments)    Irritability     Social History   Tobacco Use  . Smoking status: Former Smoker    Packs/day: 1.00    Years:  25.00    Pack years: 25.00    Types: Cigarettes    Quit date: 08/15/1988    Years since quitting: 31.9  . Smokeless tobacco: Never Used  Substance Use Topics  . Alcohol use: No    Alcohol/week: 0.0 standard drinks    Family History  Problem Relation Age of Onset  . Stroke Mother   . Heart attack Mother   . Diabetes Mother   . Stroke Father   . Heart disease Father   . Kidney disease Father        PCKD  . Cancer Sister        throat  . Diabetes Brother   . Kidney disease Sister        PCKD  . Cancer Other        5/7 nephews with lung cancer   Prior to Admission medications   Medication Sig Start Date End Date Taking? Authorizing Provider  acetaminophen (TYLENOL) 650 MG CR tablet Take 1,300 mg by mouth at bedtime.    Yes [provider]  albuterol (VENTOLIN HFA) 108 (90 Base) MCG/ACT inhaler Inhale 2 puffs into the lungs every 6 (six) hours as needed for wheezing or shortness of breath. 05/22/19  Yes Ria Bush, MD  Cyanocobalamin (B-12) 1000 MCG SUBL Place 1 tablet under the tongue daily. Patient taking differently: Place 1,000 mcg under the tongue daily.  12/07/18  Yes Ria Bush, MD  ezetimibe (ZETIA) 10 MG tablet Take 1 tablet (10 mg total) by mouth daily. 10/22/19  Yes Gollan, Kathlene November, MD  fluticasone (FLONASE) 50 MCG/ACT nasal spray Place 2 sprays into both nostrils daily. Patient taking differently: Place 2 sprays into both nostrils daily as needed for allergies.  12/20/19  Yes Ria Bush, MD  furosemide (LASIX) 20 MG tablet TAKE ONE TABLET BY MOUTH EVERY DAY AS NEEDED Patient taking differently: Take 20 mg by mouth daily as needed for edema.  03/04/20  Yes Gollan, Kathlene November, MD  gabapentin (NEURONTIN) 300 MG capsule TAKE 1 CAPSULE BY MOUTH THREE TIMES DAILY Patient taking differently: Take 300 mg by mouth 3 (three) times daily.  04/17/20  Yes Ria Bush, MD  hydrocortisone 2.5 % lotion APPLY TO AFFECTED AREAS ON SKIN AS DIRECTED DAILY TUESDAY   THURSDAY AND SATURDAY. Patient taking differently: Apply 1 application topically daily as needed (rash).  06/23/20  Yes Ralene Bathe, MD  isosorbide mononitrate (IMDUR) 30 MG 24 hr tablet Take 1 tablet (30 mg total) by mouth at bedtime. 07/02/19 10/21/28 Yes Gollan, Kathlene November, MD  ketoconazole (NIZORAL) 2 % cream APPLY TO THE AFFECTED AREA ON FACE ONCE DAILY FOR 2 WEEKS THEN ONCE DAILY UP TO 3 DAYS PER WEEK Patient taking differently: Apply 1 application topically daily as needed (rash).  06/23/20  Yes Ralene Bathe, MD  losartan (COZAAR) 25 MG tablet TAKE 1  TABLET BY MOUTH DAILY Patient taking differently: Take 25 mg by mouth daily.  04/17/20  Yes Gollan, Kathlene November, MD  metoprolol succinate (TOPROL-XL) 25 MG 24 hr tablet TAKE 1/2 TABLET EVERYDAY Patient taking differently: Take 12.5 mg by mouth daily.  06/12/20  Yes Gollan, Kathlene November, MD  omeprazole (PRILOSEC) 20 MG capsule Take 1 capsule (20 mg total) by mouth daily. 02/25/20  Yes Tyler Pita, MD  polyethylene glycol (MIRALAX / GLYCOLAX) 17 g packet Take 17 g by mouth daily as needed for mild constipation.    Yes [provider]  ranolazine (RANEXA) 500 MG 12 hr tablet TAKE ONE TABLET TWICE DAILY Patient taking differently: Take 500 mg by mouth every evening.  12/02/19  Yes [name], MD  simvastatin (ZOCOR) 40 MG tablet Take 1 tablet (40 mg total) by mouth daily. Patient taking differently: Take 40 mg by mouth every evening.  10/22/19  Yes [name], MD  tamsulosin (FLOMAX) 0.4 MG CAPS capsule Take 0.4 mg by mouth at bedtime. 05/21/20  Yes [provider]  tolterodine (DETROL LA) 4 MG 24 hr capsule Take 4 mg by mouth daily.   Yes [provider]  aspirin EC 81 MG EC tablet Take 1 tablet (81 mg total) by mouth daily. 06/26/19   Elgergawy, Silver Huguenin, MD  doxycycline (VIBRA-TABS) 100 MG tablet Take 1 tablet (100 mg total) by mouth 2 (two) times daily. Patient not taking: Reported on 06/30/2020  06/01/20   Ria Bush, MD  nitroGLYCERIN (NITROSTAT) 0.4 MG SL tablet Place 1 tablet (0.4 mg total) under the tongue every 5 (five) minutes as needed for chest pain. 12/07/18   Ria Bush, MD  predniSONE (DELTASONE) 20 MG tablet Take 2 tablets (40 mg total) by mouth daily with breakfast. Patient not taking: Reported on 06/30/2020 06/01/20   Ria Bush, MD     Review of Systems  Positive ROS: As above  All other systems have been reviewed and were otherwise negative with the exception of those mentioned in the HPI and as above.  Objective: Vital signs in last 24 hours: Temp:  [98 F (36.7 C)] 98 F (36.7 C) (11/22 0546) Pulse Rate:  [72] 72 (11/22 0546) Resp:  [18] 18 (11/22 0546) BP: (137)/(56) 137/56 (11/22 0546) SpO2:  [95 %] 95 % (11/22 0546) Estimated body mass index is 29.59 kg/m as calculated from the following:   Height as of 07/02/20: 6' (1.829 m).   Weight as of 07/02/20: 99 kg.   General Appearance: Alert Head: Normocephalic, without obvious abnormality, atraumatic Eyes: PERRL, conjunctiva/corneas clear, EOM's intact,    Ears: Normal  Throat: Normal  Neck: The patient's lumbar incision is well-healed. Back: unremarkable Lungs: Clear to auscultation bilaterally, respirations unlabored Heart: Regular rate and rhythm, no murmur, rub or gallop Abdomen: Soft, non-tender Extremities: Extremities normal, atraumatic, no cyanosis or edema Skin: unremarkable  NEUROLOGIC:   Mental status: alert and oriented,Motor Exam - grossly normal Sensory Exam - grossly normal Reflexes:  Coordination - grossly normal Gait - grossly normal Balance - grossly normal Cranial Nerves: I: smell Not tested  II: visual acuity  OS: Normal  OD: Normal   II: visual fields Full to confrontation  II: pupils Equal, round, reactive to light  III,VII: ptosis None  III,IV,VI: extraocular muscles  Full ROM  V: mastication Normal  V: facial light touch sensation  Normal   V,VII: corneal reflex  Present  VII: facial muscle function - upper  Normal  VII: facial  muscle function - lower Normal  VIII: hearing Not tested  IX: soft palate elevation  Normal  IX,X: gag reflex Present  XI: trapezius strength  5/5  XI: sternocleidomastoid strength 5/5  XI: neck flexion strength  5/5  XII: tongue strength  Normal    Data Review Lab Results  Component Value Date   WBC 5.7 07/02/2020   HGB 13.8 07/02/2020   HCT 42.1 07/02/2020   MCV 97.2 07/02/2020   PLT 191 07/02/2020   Lab Results  Component Value Date   NA 142 07/02/2020   K 4.2 07/02/2020   CL 108 07/02/2020   CO2 27 07/02/2020   BUN 24 (H) 07/02/2020   CREATININE 1.56 (H) 07/02/2020   GLUCOSE 167 (H) 07/02/2020   Lab Results  Component Value Date   INR 0.99 02/27/2017    Assessment/Plan: L4-5 spondylolisthesis, spinal stenosis, lumbago, lumbar radiculopathy, neurogenic claudication: I have discussed the situation with the patient.  I reviewed his imaging studies with him and pointed out the abnormalities.  We have discussed the various treatment options including surgery.  I have described the surgical treatment option of an L4-5 decompression, instrumentation and fusion.  I have shown him surgical models.  I have given him a surgical pamphlet.  We have discussed the risk, benefits, alternatives, expected postop course, and likelihood of achieving our goals with surgery.  I have answered all his questions.  He has decided proceed with surgery.   Ophelia Charter 07/06/2020 7:27 AM

## 2020-07-06 NOTE — Anesthesia Procedure Notes (Signed)
Procedure Name: Intubation Date/Time: 07/06/2020 7:41 AM Performed by: Janace Litten, CRNA Pre-anesthesia Checklist: Patient identified, Emergency Drugs available, Suction available and Patient being monitored Patient Re-evaluated:Patient Re-evaluated prior to induction Oxygen Delivery Method: Circle System Utilized Preoxygenation: Pre-oxygenation with 100% oxygen Induction Type: IV induction Ventilation: Mask ventilation without difficulty and Oral airway inserted - appropriate to patient size Laryngoscope Size: Mac and 4 Grade View: Grade I Tube type: Oral Tube size: 7.5 mm Number of attempts: 1 Airway Equipment and Method: Stylet and Oral airway Placement Confirmation: ETT inserted through vocal cords under direct vision,  positive ETCO2 and breath sounds checked- equal and bilateral Secured at: 23 cm Tube secured with: Tape Dental Injury: Teeth and Oropharynx as per pre-operative assessment

## 2020-07-07 DIAGNOSIS — M4316 Spondylolisthesis, lumbar region: Secondary | ICD-10-CM | POA: Diagnosis present

## 2020-07-07 LAB — CBC
HCT: 35 % — ABNORMAL LOW (ref 39.0–52.0)
Hemoglobin: 11.8 g/dL — ABNORMAL LOW (ref 13.0–17.0)
MCH: 32.8 pg (ref 26.0–34.0)
MCHC: 33.7 g/dL (ref 30.0–36.0)
MCV: 97.2 fL (ref 80.0–100.0)
Platelets: 186 10*3/uL (ref 150–400)
RBC: 3.6 MIL/uL — ABNORMAL LOW (ref 4.22–5.81)
RDW: 12.2 % (ref 11.5–15.5)
WBC: 8.9 10*3/uL (ref 4.0–10.5)
nRBC: 0 % (ref 0.0–0.2)

## 2020-07-07 LAB — BASIC METABOLIC PANEL
Anion gap: 10 (ref 5–15)
BUN: 26 mg/dL — ABNORMAL HIGH (ref 8–23)
CO2: 23 mmol/L (ref 22–32)
Calcium: 8.6 mg/dL — ABNORMAL LOW (ref 8.9–10.3)
Chloride: 103 mmol/L (ref 98–111)
Creatinine, Ser: 1.42 mg/dL — ABNORMAL HIGH (ref 0.61–1.24)
GFR, Estimated: 50 mL/min — ABNORMAL LOW (ref >60)
Glucose, Bld: 137 mg/dL — ABNORMAL HIGH (ref 70–99)
Potassium: 4.6 mmol/L (ref 3.5–5.1)
Sodium: 136 mmol/L (ref 135–145)

## 2020-07-07 MED ORDER — OXYCODONE-ACETAMINOPHEN 5-325 MG PO TABS: 1.0000 | ORAL_TABLET | ORAL | Status: DC | PRN

## 2020-07-07 MED ORDER — DOCUSATE SODIUM 100 MG PO CAPS
100.0000 mg | ORAL_CAPSULE | Freq: Two times a day (BID) | ORAL | 0 refills | Status: DC
Start: 2020-07-07 — End: 2020-10-26

## 2020-07-07 MED ORDER — OXYCODONE-ACETAMINOPHEN 5-325 MG PO TABS
1.0000 | ORAL_TABLET | ORAL | 0 refills | Status: DC | PRN
Start: 2020-07-07 — End: 2020-10-26

## 2020-07-07 MED ORDER — CYCLOBENZAPRINE HCL 10 MG PO TABS
10.0000 mg | ORAL_TABLET | Freq: Three times a day (TID) | ORAL | 0 refills | Status: DC | PRN
Start: 2020-07-07 — End: 2021-04-21

## 2020-07-07 NOTE — Evaluation (Signed)
Occupational Therapy Evaluation/Discharge Patient Details Name: SHAYDE GERVACIO MRN: [id number] DOB: 01/26/1941 Today's Date: 07/07/2020    History of Present Illness Patient is a 79 y/o male with PMH of CAD s/p CABG 1990, CHF, chronic prostatitis, DVT, emphysema, HLD, HTN, PAD, OSA, pre-diabetes. Patient has history of lumbar laminectomies. Patient with ongoing and worsening back and leg pain. Patient s/p posterior lumbar fusion L4-5 on 11/22.   Clinical Impression   PTA, pt lives with wife and reports Independence in all daily tasks and mobility without use of AD. Pt presents now with minor deficits in pain and balance, but no major safety concerns noted. Pt able to demo bed mobility, short distance mobility using RW with Modified Independence. Pt able to demo toileting task, ADLs standing at sink and dressing tasks with use of AE with Modified Independence. Pt with good maintenance of spinal precautions and brace management. Wife can assist as needed with daily tasks at home. No further skilled OT services needed at acute level or at discharge. OT to sign off.     Follow Up Recommendations  No OT follow up    Equipment Recommendations  None recommended by OT (has all needed DME)    Recommendations for Other Services       Precautions / Restrictions Precautions Precautions: Back;Fall Precaution Booklet Issued: Yes (comment) (Given by PT) Required Braces or Orthoses: Spinal Brace Spinal Brace: Lumbar corset Restrictions Weight Bearing Restrictions: No      Mobility Bed Mobility Overal bed mobility: Needs Assistance Bed Mobility: Supine to Sit     Supine to sit: Modified independent (Device/Increase time)     General bed mobility comments: Modified Independent, good use of log roll technique    Transfers Overall transfer level: Needs assistance Equipment used: Rolling walker (2 wheeled) Transfers: Sit to/from Stand Sit to Stand: Modified independent (Device/Increase  time)         General transfer comment: Modified Independent for sit to stand transfer with RW, no LOB or safety concerns    Balance Overall balance assessment: Needs assistance Sitting-balance support: No upper extremity supported;Feet supported Sitting balance-Leahy Scale: Good     Standing balance support: No upper extremity supported;During functional activity Standing balance-Leahy Scale: Fair Standing balance comment: Able to perform some standing balance tasks without UE support, use of B UE support for mobility                            ADL either performed or assessed with clinical judgement   ADL Overall ADL's : Modified independent                                       General ADL Comments: Pt able to demonstrate mobility to bathroom, toileting task and hand hygiene without physical assist. Pt able to use AE for LB dressing, perform UB dressing and manage brace with Modified Independence.     Vision Patient Visual Report: No change from baseline Vision Assessment?: No apparent visual deficits     Perception     Praxis      Pertinent Vitals/Pain Pain Assessment: Faces Faces Pain Scale: Hurts a little bit Pain Location: low back Pain Descriptors / Indicators: Grimacing Pain Intervention(s): Monitored during session     Hand Dominance Right   Extremity/Trunk Assessment Upper Extremity Assessment Upper Extremity Assessment: Overall WFL for tasks assessed  Lower Extremity Assessment Lower Extremity Assessment: Defer to PT evaluation   Cervical / Trunk Assessment Cervical / Trunk Assessment: Normal   Communication Communication Communication: No difficulties   Cognition Arousal/Alertness: Awake/alert Behavior During Therapy: WFL for tasks assessed/performed Overall Cognitive Status: Within Functional Limits for tasks assessed                                     General Comments  Collaborated with pt on  ADL/IADLs while maintaining back precautions. Pt reports family can assist as needed    Exercises     Shoulder Instructions      Home Living Family/patient expects to be discharged to:: Private residence Living Arrangements: Spouse/significant other Available Help at Discharge: Family;Available 24 hours/day Type of Home: House Home Access: Stairs to enter [employer] of Steps: 3 Entrance Stairs-Rails: Can reach both Home Layout: One level     Bathroom Shower/Tub: Occupational psychologist: Handicapped height     Home Equipment: Environmental consultant - 2 wheels;Walker - 4 wheels;Shower seat;Hand held shower head;Adaptive equipment Adaptive Equipment: Reacher;Sock aid;Long-handled shoe horn        Prior Functioning/Environment Level of Independence: Independent                 OT Problem List:        OT Treatment/Interventions:      OT Goals(Current goals can be found in the care plan section) Acute Rehab OT Goals Patient Stated Goal: go home today OT Goal Formulation: All assessment and education complete, DC therapy  OT Frequency:     Barriers to D/C:            Co-evaluation              AM-PAC OT "6 Clicks" Daily Activity     Outcome Measure Help from another person eating meals?: None Help from another person taking care of personal grooming?: None Help from another person toileting, which includes using toliet, bedpan, or urinal?: None Help from another person bathing (including washing, rinsing, drying)?: None Help from another person to put on and taking off regular upper body clothing?: None Help from another person to put on and taking off regular lower body clothing?: None 6 Click Score: 24   End of Session Equipment Utilized During Treatment: Rolling walker;Back brace Nurse Communication: Mobility status  Activity Tolerance: Patient tolerated treatment well Patient left: in bed;with call bell/phone within reach                    Time: 0734-0800 OT Time Calculation (min): 26 min Charges:  OT General Charges $OT Visit: 1 Visit OT Evaluation $OT Eval Low Complexity: 1 Low OT Treatments $Self Care/Home Management : 8-22 mins  Layla Maw, OTR/L  Layla Maw 07/07/2020, 8:12 AM

## 2020-07-07 NOTE — Plan of Care (Signed)
Patient alert and oriented, mae's well, voiding adequate amount of urine, swallowing without difficulty, no c/o pain at time of discharge. Patient discharged home with family. Script and discharged instructions given to patient. Patient and family stated understanding of instructions given. Patient has an appointment with Dr. Jenkins   

## 2020-07-07 NOTE — Anesthesia Postprocedure Evaluation (Signed)
Anesthesia Post Note  Patient: [name]  Procedure(s) Performed: POSTERIOR LUMBAR INTERBODY FUSION, INTERBODY PROSTHESIS, POSTERIOR INSTRUMENTATION LUMBAR FOUR-LUMBAR FIVE ([name] )     Patient location during evaluation: PACU Anesthesia Type: General Level of consciousness: awake and alert Pain management: pain level controlled Vital Signs Assessment: post-procedure vital signs reviewed and stable Respiratory status: spontaneous breathing, nonlabored ventilation, respiratory function stable and patient connected to nasal cannula oxygen Cardiovascular status: blood pressure returned to baseline and stable Postop Assessment: no apparent nausea or vomiting Anesthetic complications: no   No complications documented.  Last Vitals:  Vitals:   07/07/20 0504 07/07/20 0806  BP: (!) 119/59 127/67  Pulse: 91 83  Resp: 18 17  Temp: 36.4 C 36.5 C  SpO2: 97% 98%    Last Pain:  Vitals:   07/07/20 0806  TempSrc: Oral  PainSc:                  Zyia Kaneko S

## 2020-07-07 NOTE — Discharge Summary (Signed)
Physician Discharge Summary  Patient ID: [name] MRN: [id number] DOB/AGE: 1941-04-16 79 y.o.  Admit date: 07/06/2020 Discharge date: 07/07/2020  Admission Diagnoses: Lumbar spine listhesis, lumbar foraminal stenosis, lumbago, lumbar radiculopathy  Discharge Diagnoses: The same Active Problems:   Spondylolisthesis of lumbar region   Discharged Condition: good  Hospital Course: I performed a redo L4-5 laminectomy with instrumentation and fusion on 07/06/2020.  The surgery went well.  The patient's postoperative course was unremarkable.  On postoperative day #1 he requested discharge to home.  He was given written and oral discharge instructions.  All his questions were answered.  Consults: PT, OT, care management Significant Diagnostic Studies: None Treatments: L4-5 decompression, instrumentation and fusion. Discharge Exam: Blood pressure (!) 119/59, pulse 91, temperature 97.6 F (36.4 C), temperature source Oral, resp. rate 18, SpO2 97 %. The patient is alert and pleasant.  He looks well.  His lower extremity strength is grossly normal.  Disposition: Home  Discharge Instructions    Call MD for:  difficulty breathing, headache or visual disturbances   Complete by: As directed    Call MD for:  extreme fatigue   Complete by: As directed    Call MD for:  hives   Complete by: As directed    Call MD for:  persistant dizziness or light-headedness   Complete by: As directed    Call MD for:  persistant nausea and vomiting   Complete by: As directed    Call MD for:  redness, tenderness, or signs of infection (pain, swelling, redness, odor or green/yellow discharge around incision site)   Complete by: As directed    Call MD for:  severe uncontrolled pain   Complete by: As directed    Call MD for:  temperature >100.4   Complete by: As directed    Diet - low sodium heart healthy   Complete by: As directed    Discharge instructions   Complete by: As directed    Call  [phone] for a followup appointment. Take a stool softener while you are using pain medications.   Driving Restrictions   Complete by: As directed    Do not drive for 2 weeks.   Increase activity slowly   Complete by: As directed    Lifting restrictions   Complete by: As directed    Do not lift more than 5 pounds. No excessive bending or twisting.   May shower / Bathe   Complete by: As directed    Remove the dressing for 3 days after surgery.  You may shower, but leave the incision alone.   Remove dressing in 48 hours   Complete by: As directed      Allergies as of 07/07/2020      Reactions   Vioxx [rofecoxib] Other (See Comments)   Hemorrhage    Spiriva Respimat [tiotropium Bromide Monohydrate] Other (See Comments)   Elevated bp   Contrast Media [iodinated Diagnostic Agents] Itching, Rash   Delayed reaction post abdominal aortagram.    Morphine Nausea Only, Other (See Comments)   Irritability      Medication List    STOP taking these medications   acetaminophen 650 MG CR tablet Commonly known as: TYLENOL     TAKE these medications   albuterol 108 (90 Base) MCG/ACT inhaler Commonly known as: VENTOLIN HFA Inhale 2 puffs into the lungs every 6 (six) hours as needed for wheezing or shortness of breath.   aspirin 81 MG EC tablet Take 1 tablet (81 mg total) by  mouth daily.   B-12 1000 MCG Subl Place 1 tablet under the tongue daily. What changed: how much to take   cyclobenzaprine 10 MG tablet Commonly known as: FLEXERIL Take 1 tablet (10 mg total) by mouth 3 (three) times daily as needed for muscle spasms.   docusate sodium 100 MG capsule Commonly known as: COLACE Take 1 capsule (100 mg total) by mouth 2 (two) times daily.   ezetimibe 10 MG tablet Commonly known as: ZETIA Take 1 tablet (10 mg total) by mouth daily.   fluticasone 50 MCG/ACT nasal spray Commonly known as: FLONASE Place 2 sprays into both nostrils daily. What changed:   when to take  this  reasons to take this   furosemide 20 MG tablet Commonly known as: LASIX TAKE ONE TABLET BY MOUTH EVERY DAY AS NEEDED What changed: reasons to take this   gabapentin 300 MG capsule Commonly known as: NEURONTIN TAKE 1 CAPSULE BY MOUTH THREE TIMES DAILY   hydrocortisone 2.5 % lotion APPLY TO AFFECTED AREAS ON SKIN AS DIRECTED DAILY TUESDAY  THURSDAY AND SATURDAY. What changed: See the new instructions.   isosorbide mononitrate 30 MG 24 hr tablet Commonly known as: IMDUR Take 1 tablet (30 mg total) by mouth at bedtime.   ketoconazole 2 % cream Commonly known as: NIZORAL APPLY TO THE AFFECTED AREA ON FACE ONCE DAILY FOR 2 WEEKS THEN ONCE DAILY UP TO 3 DAYS PER WEEK What changed: See the new instructions.   losartan 25 MG tablet Commonly known as: COZAAR TAKE 1 TABLET BY MOUTH DAILY   metoprolol succinate 25 MG 24 hr tablet Commonly known as: TOPROL-XL TAKE 1/2 TABLET EVERYDAY What changed: See the new instructions.   nitroGLYCERIN 0.4 MG SL tablet Commonly known as: NITROSTAT Place 1 tablet (0.4 mg total) under the tongue every 5 (five) minutes as needed for chest pain.   omeprazole 20 MG capsule Commonly known as: PriLOSEC Take 1 capsule (20 mg total) by mouth daily.   oxyCODONE-acetaminophen 5-325 MG tablet Commonly known as: PERCOCET/ROXICET Take 1-2 tablets by mouth every 4 (four) hours as needed for moderate pain.   polyethylene glycol 17 g packet Commonly known as: MIRALAX / GLYCOLAX Take 17 g by mouth daily as needed for mild constipation.   ranolazine 500 MG 12 hr tablet Commonly known as: RANEXA TAKE ONE TABLET TWICE DAILY What changed: when to take this   simvastatin 40 MG tablet Commonly known as: ZOCOR Take 1 tablet (40 mg total) by mouth daily. What changed: when to take this   tamsulosin 0.4 MG Caps capsule Commonly known as: FLOMAX Take 0.4 mg by mouth at bedtime.   tolterodine 4 MG 24 hr capsule Commonly known as: DETROL LA Take 4 mg  by mouth daily.        Signed: Ophelia Charter 07/07/2020, 7:53 AM

## 2020-07-07 NOTE — Progress Notes (Signed)
CSW screened for PT needs. Per RN, patient does not require HH. CSW signing off.   Voyd Groft LCSW

## 2020-07-07 NOTE — Progress Notes (Signed)
Physical Therapy Treatment Patient Details Name: [name] MRN: [id number] DOB: 03-14-1941 Today's Date: 07/07/2020    History of Present Illness Patient is a 79 y/o male with PMH of CAD s/p CABG 1990, CHF, chronic prostatitis, DVT, emphysema, HLD, HTN, PAD, OSA, pre-diabetes. Patient has history of lumbar laminectomies. Patient with ongoing and worsening back and leg pain. Patient s/p posterior lumbar fusion L4-5 on 11/22.    PT Comments    Pt progressing well with post-op mobility. He was able to demonstrate transfers and ambulation with gross modified independence and RW for support. Pt was educated on precautions, brace application/wearing schedule, appropriate activity progression, and car transfer. Will continue to follow.     Follow Up Recommendations  Home health PT;Supervision for mobility/OOB     Equipment Recommendations  None recommended by PT    Recommendations for Other Services       Precautions / Restrictions Precautions Precautions: Back;Fall Precaution Booklet Issued: Yes (comment) Required Braces or Orthoses: Spinal Brace Spinal Brace: Lumbar corset Restrictions Weight Bearing Restrictions: No    Mobility  Bed Mobility Overal bed mobility: Modified Independent Bed Mobility: Rolling;Sidelying to Sit;Sit to Sidelying     Supine to sit: Modified independent (Device/Increase time)     General bed mobility comments: No assist required, and good log roll technique.   Transfers Overall transfer level: Modified independent Equipment used: Rolling walker (2 wheeled) Transfers: Sit to/from Stand Sit to Stand: Modified independent (Device/Increase time)         General transfer comment: No assist to power-up to full stand. VC's for improved posture.   Ambulation/Gait Ambulation/Gait assistance: Modified independent (Device/Increase time) Gait Distance (Feet): 225 Feet Assistive device: Rolling walker (2 wheeled) Gait Pattern/deviations:  Step-through pattern;Decreased stride length;Wide base of support Gait velocity: Decreased Gait velocity interpretation: 1.31 - 2.62 ft/sec, indicative of limited community ambulator General Gait Details: VC's for improved posture intermittently. Overall ambulating well without unsteadiness or LOB.    Stairs Stairs:  (Verbally reviewed. Pt declined practicing)           Wheelchair Mobility    Modified Rankin (Stroke Patients Only)       Balance Overall balance assessment: Needs assistance Sitting-balance support: No upper extremity supported;Feet supported Sitting balance-Leahy Scale: Good     Standing balance support: No upper extremity supported;During functional activity Standing balance-Leahy Scale: Fair Standing balance comment: Able to perform some standing balance tasks without UE support, use of B UE support for mobility                             Cognition Arousal/Alertness: Awake/alert Behavior During Therapy: WFL for tasks assessed/performed Overall Cognitive Status: Within Functional Limits for tasks assessed                                        Exercises      General Comments General comments (skin integrity, edema, etc.): Collaborated with pt on ADL/IADLs while maintaining back precautions. Pt reports family can assist as needed      Pertinent Vitals/Pain Pain Assessment: Faces Faces Pain Scale: Hurts a little bit Pain Location: low back Pain Descriptors / Indicators: Grimacing Pain Intervention(s): Limited activity within patient's tolerance;Monitored during session;Repositioned    Home Living Family/patient expects to be discharged to:: Private residence Living Arrangements: Spouse/significant other Available Help at Discharge: Family;Available 24 hours/day Type  of Home: House Home Access: Stairs to enter Entrance Stairs-Rails: Can reach both Home Layout: One level Home Equipment: Walker - 2 wheels;Walker - 4  wheels;Shower seat;Hand held shower head;Adaptive equipment      Prior Function Level of Independence: Independent          PT Goals (current goals can now be found in the care plan section) Acute Rehab PT Goals Patient Stated Goal: go home today PT Goal Formulation: With patient Time For Goal Achievement: 07/13/20 Potential to Achieve Goals: Good Progress towards PT goals: Progressing toward goals    Frequency    Min 5X/week      PT Plan Current plan remains appropriate    Co-evaluation              AM-PAC PT "6 Clicks" Mobility   Outcome Measure  Help needed turning from your back to your side while in a flat bed without using bedrails?: None Help needed moving from lying on your back to sitting on the side of a flat bed without using bedrails?: None Help needed moving to and from a bed to a chair (including a wheelchair)?: None Help needed standing up from a chair using your arms (e.g., wheelchair or bedside chair)?: None Help needed to walk in hospital room?: None Help needed climbing 3-5 steps with a railing? : None 6 Click Score: 24    End of Session Equipment Utilized During Treatment: Gait belt;Back brace Activity Tolerance: Patient tolerated treatment well Patient left: in bed;with call bell/phone within reach Nurse Communication: Mobility status PT Visit Diagnosis: Unsteadiness on feet (R26.81);Muscle weakness (generalized) (M62.81);Other abnormalities of gait and mobility (R26.89)     Time: 5456-2563 PT Time Calculation (min) (ACUTE ONLY): 23 min  Charges:  $Gait Training: 23-37 mins                     [name], PT, DPT Acute Rehabilitation Services Pager: [phone] Office: [phone]    [name] 07/07/2020, 9:51 AM

## 2020-07-09 MED FILL — Sodium Chloride IV Soln 0.9%: INTRAVENOUS | Qty: 1000 | Status: AC

## 2020-07-09 MED FILL — Heparin Sodium (Porcine) Inj 1000 Unit/ML: INTRAMUSCULAR | Qty: 30 | Status: AC

## 2020-07-13 ENCOUNTER — Encounter: Payer: Self-pay | Admitting: Family Medicine

## 2020-07-15 ENCOUNTER — Ambulatory Visit (INDEPENDENT_AMBULATORY_CARE_PROVIDER_SITE_OTHER): Payer: PPO | Admitting: Family Medicine

## 2020-07-15 ENCOUNTER — Encounter: Payer: Self-pay | Admitting: Family Medicine

## 2020-07-15 ENCOUNTER — Other Ambulatory Visit: Payer: Self-pay

## 2020-07-15 ENCOUNTER — Ambulatory Visit: Payer: PPO | Admitting: Family Medicine

## 2020-07-15 ENCOUNTER — Telehealth: Payer: Self-pay

## 2020-07-15 VITALS — BP 106/60 | HR 78 | Temp 98.0°F | Ht 72.0 in | Wt 223.5 lb

## 2020-07-15 DIAGNOSIS — R600 Localized edema: Secondary | ICD-10-CM

## 2020-07-15 DIAGNOSIS — M5116 Intervertebral disc disorders with radiculopathy, lumbar region: Secondary | ICD-10-CM | POA: Diagnosis not present

## 2020-07-15 DIAGNOSIS — M48062 Spinal stenosis, lumbar region with neurogenic claudication: Secondary | ICD-10-CM | POA: Diagnosis not present

## 2020-07-15 NOTE — Patient Instructions (Addendum)
Start daily weights.  Back off oxycodone, start with plain tylenol for pain control.  Use flexeril 1/2-1 tablet just as needed for muscle spasms, don't take regularly.  For foot swelling, doing some better today - continue leg elevation, use lasix 20mg  daily for the next 3 days then return to every other day dosing.

## 2020-07-15 NOTE — Telephone Encounter (Signed)
[facility] - Client TELEPHONE ADVICE RECORD [facility] Patient Name: [name] Gender: Male DOB: 06-11-1941 Age: 79 Y 47 M 14 D Return Phone Number: [phone] (Primary), [phone] (Secondary) Address: City/State/Zip: [place] [address] Client [facility] - Client Client Site [place] Physician Ria Bush - MD Contact Type Call Who Is Calling Patient / Member / Family / Caregiver Call Type Triage / Clinical Caller Name [name] Relationship To Patient Spouse Return Phone Number [phone] (Primary) Chief Complaint Walking difficulty Reason for Call Symptomatic / Request for [place] states her husband is experiencing leg/feet swelling with difficulty walking. Translation No Nurse Assessment Nurse: Raenette Rover, RN, Zella Ball Date/Time (Eastern Time): 07/15/2020 9:10:29 AM Confirm and document reason for call. If symptomatic, describe symptoms. ---Caller states her husband is experiencing leg/feet swelling with difficulty walking. Swelling occurs off and on but has been gradual over the past few days gotten worse and shoes are tight and can only wear leather slipper. No sob. Does the patient have any new or worsening symptoms? ---Yes Will a triage be completed? ---Yes Related visit to physician within the last 2 weeks? ---No Does the PT have any chronic conditions? (i.e. diabetes, asthma, this includes High risk factors for pregnancy, etc.) ---Yes List chronic conditions. ---CAD, htn Is this a behavioral health or substance abuse call? ---No Guidelines Guideline Title Affirmed Question Affirmed Notes Nurse Date/Time Eilene Ghazi Time) Leg Swelling and Edema [1] Thigh or calf pain AND [2] only 1 side AND [3] present > 1 hour Raenette Rover, RN, Zella Ball 07/15/2020 9:12:22 AM Disp. Time Eilene Ghazi Time) Disposition Final User 07/15/2020 9:17:58 AM See HCP within 4  Hours (or PCP triage) Yes Raenette Rover, RN, Zella Ball PLEASE NOTE: All timestamps contained within this report are represented as [place] Standard Time. CONFIDENTIALTY NOTICE: This fax transmission is intended only for the addressee. It contains information that is legally privileged, confidential or otherwise protected from use or disclosure. If you are not the intended recipient, you are strictly prohibited from reviewing, disclosing, copying using or disseminating any of this information or taking any action in reliance on or regarding this information. If you have received this fax in error, please notify us immediately by telephone so that we can arrange for its return to [place]. Phone: [phone], Toll-Free: [phone], Fax: [phone] Page: 2 of 2 Call Id: [id number] [place] Disagree/Comply Comply Caller Understands Yes PreDisposition Did not know what to do Care Advice Given Per Guideline SEE HCP (OR PCP TRIAGE) WITHIN 4 HOURS: CARE ADVICE given per Leg Swelling and Edema (Adult) guideline. Comments User: Wilson Singer, RN Date/Time Eilene Ghazi Time): 07/15/2020 9:13:49 AM Spine surgery one week ago. User: Wilson Singer, RN Date/Time Eilene Ghazi Time): 07/15/2020 9:19:17 AM Right lower leg from the ankle to just above the knee is more swollen than the other leg and painful in the calf and knee. User: Wilson Singer, RN Date/Time Eilene Ghazi Time): 07/15/2020 9:23:29 AM Contacted back line for urgent appointment within the next 4 hours. Scheduler is asking Dr Darnell Level for impute for scheduling. Referrals Warm transfer to backline

## 2020-07-15 NOTE — Progress Notes (Signed)
Patient ID: [name], male    DOB: Sep 09, 1940, 79 y.o.   MRN: [id number]  This visit was conducted in person.  BP 106/60    Pulse 78    Temp 98 F (36.7 C) (Temporal)    Ht 6' (1.829 m)    Wt 223 lb 8 oz (101.4 kg)    SpO2 98%    BMI 30.31 kg/m    CC: leg swelling  Subjective:   HPI: [name] is a 79 y.o. male presenting on 07/15/2020 for Leg Swelling (edema started after surgery about 10 days ago and has progressively worsened, causing severe foot pain when walking)   Patient worked in today at 1:30pm.   Recent bilateral redo laminectomy/laminotomy/foraminotomies/medial facetectomy (instrumentation and fusion) to decompress the bilateral L4 and L5 nerve roots [name]) 07/06/2020.   Here today for worsening bilateral R>L foot swelling over the past 2-3 days.  Notes chronic mild RLE swelling since knee replacement surgery.  Denies chest pain, dyspnea, unsure about weight gain. No fevers.  He has been taking furosemide 20mg  QOD.  Worsening urge incontinence despite flomax and detrol LA.   Known PAD (bilateral calcified SFA disease), arthritis, periph neuropathy - leg pain thought to be pseudoclaudication and periph neuropathy related.  Echocardiogram 09/2019 with EF 50-55% with G1DD.  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  Known CAD s/p 3v CABG 1990. Recent lexiscan myoview - low risk study.   Continues gabapentin 300mg  TID (chronic med - doubt contributing to foot edema).   Ongoing sedation throughout the day with oxycodone, flexeril and gabapentin on board - wife has had trouble keeping him awake.  Not really having muscle spasms. Oxycodone too strong. Today he took tylenol ES instead of oxycodone. Planning to stop oxycodone. May try hydrocodone he has at home instead.   Also notes R medial ankle and heel pain over last few days, thinks he did strain ankle recently but doesn't remember specific injury (difficulty due to recent increased  sedation).      Relevant past medical, surgical, family and social history reviewed and updated as indicated. Interim medical history since our last visit reviewed. Allergies and medications reviewed and updated. Outpatient Medications Prior to Visit  Medication Sig Dispense Refill   albuterol (VENTOLIN HFA) 108 (90 Base) MCG/ACT inhaler Inhale 2 puffs into the lungs every 6 (six) hours as needed for wheezing or shortness of breath. 18 g 3   aspirin EC 81 MG EC tablet Take 1 tablet (81 mg total) by mouth daily. 30 tablet 0   Cyanocobalamin (B-12) 1000 MCG SUBL Place 1 tablet under the tongue daily. (Patient taking differently: Place 1,000 mcg under the tongue daily. ) 30 each    cyclobenzaprine (FLEXERIL) 10 MG tablet Take 1 tablet (10 mg total) by mouth 3 (three) times daily as needed for muscle spasms. 30 tablet 0   docusate sodium (COLACE) 100 MG capsule Take 1 capsule (100 mg total) by mouth 2 (two) times daily. 60 capsule 0   ezetimibe (ZETIA) 10 MG tablet Take 1 tablet (10 mg total) by mouth daily. 90 tablet 3   fluticasone (FLONASE) 50 MCG/ACT nasal spray Place 2 sprays into both nostrils daily. (Patient taking differently: Place 2 sprays into both nostrils daily as needed for allergies. ) 16 g 0   furosemide (LASIX) 20 MG tablet TAKE ONE TABLET BY MOUTH EVERY DAY AS NEEDED (Patient taking differently: Take 20 mg by mouth daily as needed for edema. ) 30  tablet 3   gabapentin (NEURONTIN) 300 MG capsule TAKE 1 CAPSULE BY MOUTH THREE TIMES DAILY (Patient taking differently: Take 300 mg by mouth 3 (three) times daily. ) 270 capsule 1   HYDROcodone-acetaminophen (NORCO/VICODIN) 5-325 MG tablet Take 1-2 tablets by mouth every 4 (four) hours as needed. for pain     hydrocortisone 2.5 % lotion APPLY TO AFFECTED AREAS ON SKIN AS DIRECTED DAILY TUESDAY  THURSDAY AND SATURDAY. (Patient taking differently: Apply 1 application topically daily as needed (rash). ) 59 mL 3   isosorbide  mononitrate (IMDUR) 30 MG 24 hr tablet Take 1 tablet (30 mg total) by mouth at bedtime. 90 tablet 3   ketoconazole (NIZORAL) 2 % cream APPLY TO THE AFFECTED AREA ON FACE ONCE DAILY FOR 2 WEEKS THEN ONCE DAILY UP TO 3 DAYS PER WEEK (Patient taking differently: Apply 1 application topically daily as needed (rash). ) 15 g 3   losartan (COZAAR) 25 MG tablet TAKE 1 TABLET BY MOUTH DAILY (Patient taking differently: Take 25 mg by mouth daily. ) 90 tablet 0   metoprolol succinate (TOPROL-XL) 25 MG 24 hr tablet TAKE 1/2 TABLET EVERYDAY (Patient taking differently: Take 12.5 mg by mouth daily. ) 45 tablet 1   nitroGLYCERIN (NITROSTAT) 0.4 MG SL tablet Place 1 tablet (0.4 mg total) under the tongue every 5 (five) minutes as needed for chest pain. 25 tablet 3   omeprazole (PRILOSEC) 20 MG capsule Take 1 capsule (20 mg total) by mouth daily. 30 capsule 4   oxyCODONE-acetaminophen (PERCOCET/ROXICET) 5-325 MG tablet Take 1-2 tablets by mouth every 4 (four) hours as needed for moderate pain. 30 tablet 0   polyethylene glycol (MIRALAX / GLYCOLAX) 17 g packet Take 17 g by mouth daily as needed for mild constipation.      ranolazine (RANEXA) 500 MG 12 hr tablet TAKE ONE TABLET TWICE DAILY (Patient taking differently: Take 500 mg by mouth every evening. ) 180 tablet 2   simvastatin (ZOCOR) 40 MG tablet Take 1 tablet (40 mg total) by mouth daily. (Patient taking differently: Take 40 mg by mouth every evening. ) 90 tablet 3   tamsulosin (FLOMAX) 0.4 MG CAPS capsule Take 0.4 mg by mouth at bedtime.     tolterodine (DETROL LA) 4 MG 24 hr capsule Take 4 mg by mouth daily.     No facility-administered medications prior to visit.     Per HPI unless specifically indicated in ROS section below Review of Systems Objective:  BP 106/60    Pulse 78    Temp 98 F (36.7 C) (Temporal)    Ht 6' (1.829 m)    Wt 223 lb 8 oz (101.4 kg)    SpO2 98%    BMI 30.31 kg/m   Wt Readings from Last 3 Encounters:  07/15/20 223 lb 8  oz (101.4 kg)  07/02/20 218 lb 3.2 oz (99 kg)  06/02/20 217 lb (98.4 kg)      Physical Exam Vitals and nursing note reviewed.  Constitutional:      Appearance: Normal appearance. He is not ill-appearing.  Cardiovascular:     Rate and Rhythm: Normal rate and regular rhythm.     Pulses: Normal pulses.     Heart sounds: Normal heart sounds. No murmur heard.   Pulmonary:     Effort: Pulmonary effort is normal. No respiratory distress.     Breath sounds: No wheezing, rhonchi or rales.     Comments: Bibasilar crackles Musculoskeletal:     Right lower leg:  Edema (tr) present.     Left lower leg: Edema (tr) present.     Comments:  2+ DP bilaterally No palpable cords R calf 36 cm circ L calf 35 cm circ  Skin:    General: Skin is warm and dry.     Findings: Wound present.          Comments: Midline lumbar incision well approximated with expected mild surrounding edema without erythema or drainage  Neurological:     Mental Status: He is alert.  Psychiatric:        Mood and Affect: Mood normal.        Behavior: Behavior normal.       Results for orders placed or performed during the hospital encounter of 07/06/20  Glucose, capillary  Result Value Ref Range   Glucose-Capillary 132 (H) 70 - 99 mg/dL  CBC  Result Value Ref Range   WBC 8.9 4.0 - 10.5 K/uL   RBC 3.60 (L) 4.22 - 5.81 MIL/uL   Hemoglobin 11.8 (L) 13.0 - 17.0 g/dL   HCT 35.0 (L) 39 - 52 %   MCV 97.2 80.0 - 100.0 fL   MCH 32.8 26.0 - 34.0 pg   MCHC 33.7 30.0 - 36.0 g/dL   RDW 12.2 11.5 - 15.5 %   Platelets 186 150 - 400 K/uL   nRBC 0.0 0.0 - 0.2 %  Basic Metabolic Panel  Result Value Ref Range   Sodium 136 135 - 145 mmol/L   Potassium 4.6 3.5 - 5.1 mmol/L   Chloride 103 98 - 111 mmol/L   CO2 23 22 - 32 mmol/L   Glucose, Bld 137 (H) 70 - 99 mg/dL   BUN 26 (H) 8 - 23 mg/dL   Creatinine, Ser 1.42 (H) 0.61 - 1.24 mg/dL   Calcium 8.6 (L) 8.9 - 10.3 mg/dL   GFR, Estimated 50 (L) >60 mL/min   Anion gap 10 5 -  15   Assessment & Plan:  This visit occurred during the SARS-CoV-2 public health emergency.  Safety protocols were in place, including screening questions prior to the visit, additional usage of staff PPE, and extensive cleaning of exam room while observing appropriate contact time as indicated for disinfecting solutions.   Problem List Items Addressed This Visit    Pedal edema - Primary    Progressive after recent lumbar sugery, right side slightly more than left (but this is chronic). Predominantly to bilateral feet and not lower legs. No redness, warmth, no palpable cords - not consistent with DVT although he just had lumbar surgery. Actually since legs elevated overnight pt and wife note swelling today is improved. With crackles bibasilarly anticipate component of CHF - he has only been taking lasix 20mg  QOD - will increase to daily for next 3 days and update with effect.       Lumbar stenosis with neurogenic claudication   Relevant Medications   HYDROcodone-acetaminophen (NORCO/VICODIN) 5-325 MG tablet   Lumbar disc disease with radiculopathy    S/p recent redo extensive lumbar surgery [name]) seems to be revovering well however oxycodone is too strong for him - he will start with scheduled tylenol for pain relief and use hydrocodone (he has some leftover at home) for breakthrough pain.  Also discussed changing flexeril to PRN muscle spasms.  He will continue his gabapentin 300mg  TID          No orders of the defined types were placed in this encounter.  No orders of the defined types were  placed in this encounter.   Patient Instructions  Start daily weights.  Back off oxycodone, start with plain tylenol for pain control.  Use flexeril 1/2-1 tablet just as needed for muscle spasms, don't take regularly.  For foot swelling, doing some better today - continue leg elevation, use lasix 20mg  daily for the next 3 days then return to every other day dosing.    Follow up plan: No  follow-ups on file.  Ria Bush, MD

## 2020-07-15 NOTE — Telephone Encounter (Signed)
Per appt notes pt has appt 07/15/20 at 1:30 with Dr Darnell Level.

## 2020-07-16 ENCOUNTER — Encounter (HOSPITAL_COMMUNITY): Payer: Self-pay

## 2020-07-16 ENCOUNTER — Emergency Department (HOSPITAL_COMMUNITY)
Admission: EM | Admit: 2020-07-16 | Discharge: 2020-07-16 | Disposition: A | Discharge status: Home / Self Care | Payer: PPO | Attending: Emergency Medicine | Admitting: Emergency Medicine

## 2020-07-16 ENCOUNTER — Emergency Department (HOSPITAL_COMMUNITY): Payer: PPO

## 2020-07-16 ENCOUNTER — Emergency Department (HOSPITAL_BASED_OUTPATIENT_CLINIC_OR_DEPARTMENT_OTHER)
Admit: 2020-07-16 | Discharge: 2020-07-16 | Disposition: A | Discharge status: Home / Self Care | Payer: PPO | Attending: Emergency Medicine | Admitting: Emergency Medicine

## 2020-07-16 ENCOUNTER — Encounter: Payer: Self-pay | Admitting: Family Medicine

## 2020-07-16 DIAGNOSIS — I509 Heart failure, unspecified: Secondary | ICD-10-CM | POA: Diagnosis not present

## 2020-07-16 DIAGNOSIS — R0602 Shortness of breath: Secondary | ICD-10-CM | POA: Diagnosis not present

## 2020-07-16 DIAGNOSIS — Z96643 Presence of artificial hip joint, bilateral: Secondary | ICD-10-CM | POA: Insufficient documentation

## 2020-07-16 DIAGNOSIS — R2241 Localized swelling, mass and lump, right lower limb: Secondary | ICD-10-CM | POA: Insufficient documentation

## 2020-07-16 DIAGNOSIS — Z7951 Long term (current) use of inhaled steroids: Secondary | ICD-10-CM | POA: Diagnosis not present

## 2020-07-16 DIAGNOSIS — Z8616 Personal history of COVID-19: Secondary | ICD-10-CM | POA: Diagnosis not present

## 2020-07-16 DIAGNOSIS — Z96651 Presence of right artificial knee joint: Secondary | ICD-10-CM | POA: Insufficient documentation

## 2020-07-16 DIAGNOSIS — Z87891 Personal history of nicotine dependence: Secondary | ICD-10-CM | POA: Diagnosis not present

## 2020-07-16 DIAGNOSIS — R079 Chest pain, unspecified: Secondary | ICD-10-CM | POA: Diagnosis not present

## 2020-07-16 DIAGNOSIS — Z79899 Other long term (current) drug therapy: Secondary | ICD-10-CM | POA: Diagnosis not present

## 2020-07-16 DIAGNOSIS — I2510 Atherosclerotic heart disease of native coronary artery without angina pectoris: Secondary | ICD-10-CM | POA: Diagnosis not present

## 2020-07-16 DIAGNOSIS — R609 Edema, unspecified: Secondary | ICD-10-CM

## 2020-07-16 DIAGNOSIS — Z7982 Long term (current) use of aspirin: Secondary | ICD-10-CM | POA: Diagnosis not present

## 2020-07-16 DIAGNOSIS — J449 Chronic obstructive pulmonary disease, unspecified: Secondary | ICD-10-CM | POA: Insufficient documentation

## 2020-07-16 DIAGNOSIS — I110 Hypertensive heart disease with heart failure: Secondary | ICD-10-CM | POA: Diagnosis not present

## 2020-07-16 DIAGNOSIS — Z955 Presence of coronary angioplasty implant and graft: Secondary | ICD-10-CM | POA: Diagnosis not present

## 2020-07-16 DIAGNOSIS — M791 Myalgia, unspecified site: Secondary | ICD-10-CM | POA: Insufficient documentation

## 2020-07-16 DIAGNOSIS — M7989 Other specified soft tissue disorders: Secondary | ICD-10-CM

## 2020-07-16 LAB — CBC
HCT: 33.6 % — ABNORMAL LOW (ref 39.0–52.0)
Hemoglobin: 11.3 g/dL — ABNORMAL LOW (ref 13.0–17.0)
MCH: 32.5 pg (ref 26.0–34.0)
MCHC: 33.6 g/dL (ref 30.0–36.0)
MCV: 96.6 fL (ref 80.0–100.0)
Platelets: 282 10*3/uL (ref 150–400)
RBC: 3.48 MIL/uL — ABNORMAL LOW (ref 4.22–5.81)
RDW: 11.9 % (ref 11.5–15.5)
WBC: 5.7 10*3/uL (ref 4.0–10.5)
nRBC: 0 % (ref 0.0–0.2)

## 2020-07-16 LAB — BRAIN NATRIURETIC PEPTIDE: B Natriuretic Peptide: 178.6 pg/mL — ABNORMAL HIGH (ref 0.0–100.0)

## 2020-07-16 LAB — BASIC METABOLIC PANEL
Anion gap: 13 (ref 5–15)
BUN: 26 mg/dL — ABNORMAL HIGH (ref 8–23)
CO2: 26 mmol/L (ref 22–32)
Calcium: 9.1 mg/dL (ref 8.9–10.3)
Chloride: 100 mmol/L (ref 98–111)
Creatinine, Ser: 1.5 mg/dL — ABNORMAL HIGH (ref 0.61–1.24)
GFR, Estimated: 47 mL/min — ABNORMAL LOW (ref >60)
Glucose, Bld: 125 mg/dL — ABNORMAL HIGH (ref 70–99)
Potassium: 4.5 mmol/L (ref 3.5–5.1)
Sodium: 139 mmol/L (ref 135–145)

## 2020-07-16 MED ORDER — ACETAMINOPHEN 500 MG PO TABS
1000.0000 mg | ORAL_TABLET | Freq: Once | ORAL | Status: AC
  Administered 2020-07-16: 1000 mg via ORAL
  Filled 2020-07-16: qty 2

## 2020-07-16 NOTE — ED Triage Notes (Signed)
Pt states that he has increased swelling in bilateral feet over the past two days, some SOB, hx of CHF

## 2020-07-16 NOTE — Progress Notes (Signed)
Lower extremity venous has been completed.   Preliminary results in CV Proc.   Abram Sander 07/16/2020 8:48 AM

## 2020-07-16 NOTE — Assessment & Plan Note (Addendum)
Progressive after recent lumbar sugery, right side slightly more than left (but this is chronic). Predominantly to bilateral feet and not lower legs. No redness, warmth, no palpable cords - not consistent with DVT although he just had lumbar surgery. Actually since legs elevated overnight pt and wife note swelling today is improved. With crackles bibasilarly anticipate component of CHF - he has only been taking lasix 20mg  QOD - will increase to daily for next 3 days and update with effect.

## 2020-07-16 NOTE — Assessment & Plan Note (Addendum)
S/p recent redo extensive lumbar surgery [name]) seems to be revovering well however oxycodone is too strong for him - he will start with scheduled tylenol for pain relief and use hydrocodone (he has some leftover at home) for breakthrough pain.  Also discussed changing flexeril to PRN muscle spasms.  He will continue his gabapentin 300mg  TID

## 2020-07-16 NOTE — Discharge Instructions (Signed)
Elevated the leg, try to ambulate often.  Use your lasix as discussed with your family doc.

## 2020-07-16 NOTE — ED Provider Notes (Signed)
[facility] EMERGENCY DEPARTMENT Provider Note   CSN: [id number] Arrival date & time: 07/16/20  6568     History Chief Complaint  Patient presents with  . Shortness of Breath    [name] is a 79 y.o. male.  79 Yo M with a chief complaints of right leg pain and swelling.  Going on for about a week now.  Just about 10 days postop from a lumbar surgery.  Having pain to the leg and some difficulty bearing weight.  Having some shortness of breath with this as well.  Denies history of PE or DVT.  Had seen his family doctor yesterday and was started on Lasix.  Due to worsening pain came to the ED overnight for evaluation.  He describes his trouble breathing is feeling like he has some crackles.  Denies cough congestion or fever.  Denies abdominal pain.  Denies numbness or weakness to the leg.  Denies loss of bowel or bladder denies loss of rectal sensation.  Denies pain or drainage at his surgical site.  The history is provided by the patient.  Shortness of Breath Associated symptoms: no abdominal pain, no chest pain, no fever, no headaches, no rash and no vomiting   Illness Severity:  Moderate Onset quality:  Gradual Duration:  10 days Timing:  Constant Progression:  Worsening Chronicity:  New Associated symptoms: myalgias and shortness of breath   Associated symptoms: no abdominal pain, no chest pain, no congestion, no diarrhea, no fever, no headaches, no rash and no vomiting        Past Medical History:  Diagnosis Date  . Actinic keratosis   . Anginal pain ([place])   . BENIGN PROSTATIC HYPERTROPHY, WITH URINARY OBSTRUCTION 09/06/2007  . CAD s/p CABG    a. 1990 s/p MI-->CABG x 2; b. 2002 s/p BMS to LCX; c. 06/2015 Cath: LM 50ost, LAD 100ost/p, 67m/d, D2 nl, LCX  patent stent, RCA 80p (small), LIMA->LAD nl, VG->D2 nl-->Med Rx.  . Carotid arterial disease ([place])    a. 08/2016 Carotid U/S: <39% bilat.  . CHF (congestive heart failure) ([place])   . 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 Community acquired pneumonia of right lower lobe of lung 06/05/2017  . COVID-19 virus infection 05/30/2019   Covid PNA with hospitalization 05/2019  . 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 DVT, HX OF 1998  . Emphysema of lung ([place])   . GERD 04/30/2007  . Headache    hx migraines  . History of hiatal hernia   . History of shingles   . HYPERLIPIDEMIA 04/26/2007  . HYPERTENSION 04/30/2007  . INGUINAL HERNIA, RIGHT 05/26/2010  . Laceration of skin of left hand 03/08/2018  . Lumbar disc disease with radiculopathy    neuropathy in feet  . Myocardial infarction ([place])    1989, 2002  . OSA (obstructive sleep apnea)    a. did not tolerate CPAP.  Marland Kitchen Osteoarthritis   . PAD (peripheral artery disease) ([place])    a. 07/2017 LE duplex: RSFA 75-18m, LSFA 75-59m, 50-74d.  . Pneumonia 07/2014   [facility] hospitalization  . Pneumonia due to COVID-19 virus 06/02/2019  . Pre-diabetes   . PSA, INCREASED 07/09/2008  . Spinal stenosis of lumbar region     Patient Active Problem List   Diagnosis Date Noted  . Spondylolisthesis of lumbar region 07/07/2020  . Sore throat 05/29/2020  . COPD (chronic obstructive pulmonary disease) ([place]) 01/14/2020  . Tick bite of left thigh 12/05/2019  . Coccydynia 09/20/2019  .  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 Anxiety 06/04/2019  . Hand swelling 12/07/2018  . Chronic right shoulder pain 09/10/2018  . Fall 03/08/2018  . Cervical stenosis of spinal canal 06/05/2017  . Pedal edema 06/05/2017  . Health maintenance examination 12/06/2016  . 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 Overweight (BMI 25.0-29.9) 07/04/2016  . Low vitamin B12 level 01/01/2016  . Medicare annual wellness visit, subsequent 07/03/2015  . Advanced care planning/counseling discussion 07/03/2015  . Prediabetes 05/04/2015  . Chest pain 05/04/2015  . Ex-smoker 05/04/2015  . 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 Spermatocele  04/02/2013  . Syncopal vertigo 11/04/2010  . Lumbar disc disease with radiculopathy 11/04/2010  . Inguinal hernia 05/26/2010  . 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 PVD (peripheral vascular disease) with claudication ([place]) 08/11/2009  . 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 Carotid stenosis 07/09/2008  . 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 Essential hypertension 04/30/2007  . GERD 04/30/2007  . Hyperlipidemia 04/26/2007  . OA (osteoarthritis) of knee 04/26/2007  . DVT, HX OF 04/26/2007    Past Surgical History:  Procedure Laterality Date  . ABDOMINAL AORTOGRAM W/LOWER EXTREMITY [name] 09/12/2018   Procedure: ABDOMINAL AORTOGRAM W/LOWER EXTREMITY;  Surgeon: [name], MD;  Location: [facility];  Service: Cardiovascular;  Laterality: [name];  . APPENDECTOMY     rupture  . [place] and then another one at [facility]  . CARDIAC CATHETERIZATION [name] 07/07/2015   Procedure: Left Heart Cath and Cors/Grafts Angiography;  Surgeon: [name], MD;  Location: [facility];  Service: Cardiovascular;  Laterality: [name];  . CATARACT EXTRACTION, BILATERAL    . CERVICAL SPINE SURGERY  12/2016   cervical stenosis [name])  . COLONOSCOPY  03/2010   HP polyp, diverticulosis, rpt 10 yrs ([name])  . CORONARY ARTERY BYPASS GRAFT  1990   3 vessel   . CYSTOSCOPY  12/23/10   [place]  . EYE SURGERY     cataract  . JOINT REPLACEMENT    . KNEE ARTHROSCOPY Right    x2  . LAMINOTOMY  1986   L5/S1 lumbar laminotomy for two ruptured discs/fusion  . LUMBAR LAMINECTOMY/DECOMPRESSION MICRODISCECTOMY [name] 07/13/2016   Procedure: LUMBAR TWO-THREE, LUMBAR THREE-FOUR, LUMBAR FOUR-FIVE LAMINECTOMY AND FORAMINOTOMY;  Surgeon: [name], MD;  Location: [facility];  Service: Neurosurgery;  Laterality: [name];  LAMINECTOMY AND FORAMINOTOMY L2-L3, L3-L4,L4-L5  . LUMBAR SPINE SURGERY  06/2020   Bilateral redo  laminectomy/laminotomy/foraminotomies/medial facetectomy to decompress the bilateral L4 and L5 nerve roots [name])  . TOTAL HIP ARTHROPLASTY Bilateral 1999,2000  . TOTAL KNEE ARTHROPLASTY Right 03/06/2017   Procedure: RIGHT TOTAL KNEE ARTHROPLASTY;  Surgeon: Gaynelle Arabian, MD;  Location: [facility];  Service: Orthopedics;  Laterality: Right;       Family History  Problem Relation Age of Onset  . Stroke Mother   . Heart attack Mother   . Diabetes Mother   . Stroke Father   . Heart disease Father   . Kidney disease Father        PCKD  . Cancer Sister        throat  . Diabetes Brother   . Kidney disease Sister        PCKD  . Cancer Other        5/7 nephews with lung cancer    Social History   Tobacco Use  . Smoking status: Former Smoker  Packs/day: 1.00    Years: 25.00    Pack years: 25.00    Types: Cigarettes    Quit date: 08/15/1988    Years since quitting: 31.9  . Smokeless tobacco: Never Used  Vaping Use  . Vaping Use: Never used  Substance Use Topics  . Alcohol use: No    Alcohol/week: 0.0 standard drinks  . Drug use: No    Home Medications Prior to Admission medications   Medication Sig Start Date End Date Taking? Authorizing Provider  albuterol (VENTOLIN HFA) 108 (90 Base) MCG/ACT inhaler Inhale 2 puffs into the lungs every 6 (six) hours as needed for wheezing or shortness of breath. 05/22/19   Ria Bush, MD  aspirin EC 81 MG EC tablet Take 1 tablet (81 mg total) by mouth daily. 06/26/19   Elgergawy, Silver Huguenin, MD  Cyanocobalamin (B-12) 1000 MCG SUBL Place 1 tablet under the tongue daily. Patient taking differently: Place 1,000 mcg under the tongue daily.  12/07/18   Ria Bush, MD  cyclobenzaprine (FLEXERIL) 10 MG tablet Take 1 tablet (10 mg total) by mouth 3 (three) times daily as needed for muscle spasms. 07/07/20   [name], MD  docusate sodium (COLACE) 100 MG capsule Take 1 capsule (100 mg total) by mouth 2 (two) times daily. 07/07/20    [name], MD  ezetimibe (ZETIA) 10 MG tablet Take 1 tablet (10 mg total) by mouth daily. 10/22/19   [name], MD  fluticasone (FLONASE) 50 MCG/ACT nasal spray Place 2 sprays into both nostrils daily. Patient taking differently: Place 2 sprays into both nostrils daily as needed for allergies.  12/20/19   Ria Bush, MD  furosemide (LASIX) 20 MG tablet TAKE ONE TABLET BY MOUTH EVERY DAY AS NEEDED Patient taking differently: Take 20 mg by mouth daily as needed for edema.  03/04/20   [name], MD  gabapentin (NEURONTIN) 300 MG capsule TAKE 1 CAPSULE BY MOUTH THREE TIMES DAILY Patient taking differently: Take 300 mg by mouth 3 (three) times daily.  04/17/20   Ria Bush, MD  HYDROcodone-acetaminophen (NORCO/VICODIN) 5-325 MG tablet Take 1-2 tablets by mouth every 4 (four) hours as needed. for pain 07/13/20   [provider]  hydrocortisone 2.5 % lotion APPLY TO AFFECTED AREAS ON SKIN AS DIRECTED DAILY TUESDAY  THURSDAY AND SATURDAY. Patient taking differently: Apply 1 application topically daily as needed (rash).  06/23/20   Ralene Bathe, MD  isosorbide mononitrate (IMDUR) 30 MG 24 hr tablet Take 1 tablet (30 mg total) by mouth at bedtime. 07/02/19 10/21/28  [name], MD  ketoconazole (NIZORAL) 2 % cream APPLY TO THE AFFECTED AREA ON FACE ONCE DAILY FOR 2 WEEKS THEN ONCE DAILY UP TO 3 DAYS PER WEEK Patient taking differently: Apply 1 application topically daily as needed (rash).  06/23/20   Ralene Bathe, MD  losartan (COZAAR) 25 MG tablet TAKE 1 TABLET BY MOUTH DAILY Patient taking differently: Take 25 mg by mouth daily.  04/17/20   [name], MD  metoprolol succinate (TOPROL-XL) 25 MG 24 hr tablet TAKE 1/2 TABLET EVERYDAY Patient taking differently: Take 12.5 mg by mouth daily.  06/12/20   [name], MD  nitroGLYCERIN (NITROSTAT) 0.4 MG SL tablet Place 1 tablet (0.4 mg total) under the tongue every 5 (five) minutes as needed for  chest pain. 12/07/18   Ria Bush, MD  omeprazole (PRILOSEC) 20 MG capsule Take 1 capsule (20 mg total) by mouth daily. 02/25/20   Vernard Gambles  L, MD  oxyCODONE-acetaminophen (PERCOCET/ROXICET) 5-325 MG tablet Take 1-2 tablets by mouth every 4 (four) hours as needed for moderate pain. 07/07/20   [name], MD  polyethylene glycol (MIRALAX / GLYCOLAX) 17 g packet Take 17 g by mouth daily as needed for mild constipation.     [provider]  ranolazine (RANEXA) 500 MG 12 hr tablet TAKE ONE TABLET TWICE DAILY Patient taking differently: Take 500 mg by mouth every evening.  12/02/19   [name], MD  simvastatin (ZOCOR) 40 MG tablet Take 1 tablet (40 mg total) by mouth daily. Patient taking differently: Take 40 mg by mouth every evening.  10/22/19   [name], MD  tamsulosin (FLOMAX) 0.4 MG CAPS capsule Take 0.4 mg by mouth at bedtime. 05/21/20   [provider]  tolterodine (DETROL LA) 4 MG 24 hr capsule Take 4 mg by mouth daily.    [provider]    Allergies    Vioxx [rofecoxib], Spiriva respimat [tiotropium bromide monohydrate], Contrast media [iodinated diagnostic agents], and Morphine  Review of Systems   Review of Systems  Constitutional: Negative for chills and fever.  HENT: Negative for congestion and facial swelling.   Eyes: Negative for discharge and visual disturbance.  Respiratory: Positive for shortness of breath.   Cardiovascular: Positive for leg swelling. Negative for chest pain and palpitations.  Gastrointestinal: Negative for abdominal pain, diarrhea and vomiting.  Musculoskeletal: Positive for myalgias. Negative for arthralgias.  Skin: Negative for color change and rash.  Neurological: Negative for tremors, syncope and headaches.  Psychiatric/Behavioral: Negative for confusion and dysphoric mood.    Physical Exam Updated Vital Signs BP (!) 113/57 (BP Location: Right Arm)   Pulse 77   Temp (!) 97.4 F (36.3 C)  (Oral)   Resp 18   SpO2 98%   Physical Exam Vitals and nursing note reviewed.  Constitutional:      Appearance: He is well-developed.  HENT:     Head: Normocephalic and atraumatic.  Eyes:     Pupils: Pupils are equal, round, and reactive to light.  Neck:     Vascular: No JVD.  Cardiovascular:     Rate and Rhythm: Normal rate and regular rhythm.     Heart sounds: No murmur heard.  No friction rub. No gallop.   Pulmonary:     Effort: No respiratory distress.     Breath sounds: No wheezing.  Abdominal:     General: There is no distension.     Tenderness: There is no guarding or rebound.  Musculoskeletal:        General: Normal range of motion.     Cervical back: Normal range of motion and neck supple.     Right lower leg: Edema present.     Comments: Unilateral lower extremity edema to the right leg.  Pitting.  Pulse motor and sensation intact distally.  No appreciable weakness.  Patient has midline L-spine incision with some mild edema adjacent bilaterally.  No obvious fluctuance or induration.  No warmth.  Skin:    Coloration: Skin is not pale.     Findings: No rash.  Neurological:     Mental Status: He is alert and oriented to person, place, and time.  Psychiatric:        Behavior: Behavior normal.     ED Results / Procedures / Treatments   Labs (all labs ordered are listed, but only abnormal results are displayed) Labs Reviewed  CBC - Abnormal; Notable for the following  components:      Result Value   RBC 3.48 (*)    Hemoglobin 11.3 (*)    HCT 33.6 (*)    All other components within normal limits  BASIC METABOLIC PANEL - Abnormal; Notable for the following components:   Glucose, Bld 125 (*)    BUN 26 (*)    Creatinine, Ser 1.50 (*)    GFR, Estimated 47 (*)    All other components within normal limits  BRAIN NATRIURETIC PEPTIDE - Abnormal; Notable for the following components:   B Natriuretic Peptide 178.6 (*)    All other components within normal limits    TROPONIN I (HIGH SENSITIVITY)    EKG EKG Interpretation  Date/Time:  Thursday July 16 2020 02:29:08 EST Ventricular Rate:  84 PR Interval:  166 QRS Duration: 102 QT Interval:  354 QTC Calculation: 418 R Axis:   -57 Text Interpretation: Normal sinus rhythm Low voltage QRS Left anterior fascicular block Septal infarct , age undetermined Abnormal ECG No significant change since last tracing Confirmed by Deno Etienne [phone]) on 07/16/2020 7:11:53 AM   Radiology DG Chest 2 View  Result Date: 07/16/2020 CLINICAL DATA:  Chest pain and shortness of breath EXAM: CHEST - 2 VIEW COMPARISON:  12/18/2019 FINDINGS: Cardiac shadow is mildly enlarged. Postsurgical changes are again noted and stable. The lungs are clear bilaterally. No bony abnormality is noted. IMPRESSION: No active cardiopulmonary disease. Electronically Signed   By: Inez Catalina M.D.   On: 07/16/2020 03:02   VAS [place] LOWER EXTREMITY VENOUS (DVT) (ONLY [facility] & [facility])  Result Date: 07/16/2020  Lower Venous DVT Study Indications: Edema.  Comparison Study: 06/23/19 previous Performing Technologist: Abram Sander RVS  Examination Guidelines: A complete evaluation includes B-mode imaging, spectral Doppler, color Doppler, and power Doppler as needed of all accessible portions of each vessel. Bilateral testing is considered an integral part of a complete examination. Limited examinations for reoccurring indications may be performed as noted. The reflux portion of the exam is performed with the patient in reverse Trendelenburg.  +---------+---------------+---------+-----------+----------+--------------+ RIGHT    CompressibilityPhasicitySpontaneityPropertiesThrombus Aging +---------+---------------+---------+-----------+----------+--------------+ CFV      Full           Yes      Yes                                 +---------+---------------+---------+-----------+----------+--------------+ SFJ      Full                                                         +---------+---------------+---------+-----------+----------+--------------+ FV Prox  Full                                                        +---------+---------------+---------+-----------+----------+--------------+ FV Mid   Full                                                        +---------+---------------+---------+-----------+----------+--------------+  FV DistalFull                                                        +---------+---------------+---------+-----------+----------+--------------+ PFV      Full                                                        +---------+---------------+---------+-----------+----------+--------------+ POP      Full           Yes      Yes                                 +---------+---------------+---------+-----------+----------+--------------+ PTV      Full                                                        +---------+---------------+---------+-----------+----------+--------------+ PERO     Full                                                        +---------+---------------+---------+-----------+----------+--------------+   +----+---------------+---------+-----------+----------+-------------------+ LEFTCompressibilityPhasicitySpontaneityPropertiesThrombus Aging      +----+---------------+---------+-----------+----------+-------------------+ CFV                                              Not well visualized +----+---------------+---------+-----------+----------+-------------------+     Summary: RIGHT: - There is no evidence of deep vein thrombosis in the lower extremity.  - No cystic structure found in the popliteal fossa.   *See table(s) above for measurements and observations.    Preliminary     Procedures Procedures (including critical care time)  Medications Ordered in ED Medications  acetaminophen (TYLENOL) tablet 1,000 mg (has no administration in time range)    ED  Course  I have reviewed the triage vital signs and the nursing notes.  Pertinent labs & imaging results that were available during my care of the patient were reviewed by me and considered in my medical decision making (see chart for details).    MDM Rules/Calculators/A&P                          79 yo M with a chief complaints of right leg pain and swelling.  Going on for the past week.  Saw his family doctor yesterday and told to elevated and started on Lasix.  Presentation is concerning for a DVT with recent surgery.  Will obtain a ultrasound.  DVT study is negative.  Will discharge patient home.  PCP follow-up.  9:08 AM:  I have discussed the diagnosis/risks/treatment options with the patient and believe the pt to be eligible for discharge home to follow-up with  PCP. We also discussed returning to the ED immediately if new or worsening sx occur. We discussed the sx which are most concerning (e.g., sudden worsening pain, fever, inability to tolerate by mouth) that necessitate immediate return. Medications administered to the patient during their visit and any new prescriptions provided to the patient are listed below.  Medications given during this visit Medications  acetaminophen (TYLENOL) tablet 1,000 mg (has no administration in time range)     The patient appears reasonably screen and/or stabilized for discharge and I doubt any other medical condition or other [facility] requiring further screening, evaluation, or treatment in the ED at this time prior to discharge.   Final Clinical Impression(s) / ED Diagnoses Final diagnoses:  Right leg swelling    Rx / DC Orders ED Discharge Orders    None       Deno Etienne, DO 07/16/20 0908

## 2020-07-16 NOTE — ED Notes (Signed)
Pt returned from US

## 2020-07-24 DIAGNOSIS — M48062 Spinal stenosis, lumbar region with neurogenic claudication: Secondary | ICD-10-CM | POA: Diagnosis not present

## 2020-08-07 ENCOUNTER — Other Ambulatory Visit: Payer: Self-pay | Admitting: Cardiovascular Disease

## 2020-08-20 ENCOUNTER — Other Ambulatory Visit: Payer: Self-pay

## 2020-08-21 ENCOUNTER — Ambulatory Visit (INDEPENDENT_AMBULATORY_CARE_PROVIDER_SITE_OTHER): Payer: PPO | Admitting: Family Medicine

## 2020-08-21 ENCOUNTER — Encounter: Payer: Self-pay | Admitting: Family Medicine

## 2020-08-21 VITALS — BP 140/60 | HR 65 | Temp 96.7°F | Ht 72.0 in | Wt 217.1 lb

## 2020-08-21 DIAGNOSIS — J432 Centrilobular emphysema: Secondary | ICD-10-CM

## 2020-08-21 DIAGNOSIS — N5082 Scrotal pain: Secondary | ICD-10-CM | POA: Diagnosis not present

## 2020-08-21 DIAGNOSIS — M48062 Spinal stenosis, lumbar region with neurogenic claudication: Secondary | ICD-10-CM | POA: Diagnosis not present

## 2020-08-21 DIAGNOSIS — I739 Peripheral vascular disease, unspecified: Secondary | ICD-10-CM | POA: Diagnosis not present

## 2020-08-21 DIAGNOSIS — M5116 Intervertebral disc disorders with radiculopathy, lumbar region: Secondary | ICD-10-CM | POA: Diagnosis not present

## 2020-08-21 LAB — POCT URINALYSIS DIP (MANUAL ENTRY)
Bilirubin, UA: NEGATIVE
Glucose, UA: NEGATIVE mg/dL
Ketones, POC UA: NEGATIVE mg/dL
Leukocytes, UA: NEGATIVE
Nitrite, UA: NEGATIVE
Protein Ur, POC: NEGATIVE mg/dL
Spec Grav, UA: 1.015 (ref 1.010–1.025)
Urobilinogen, UA: NEGATIVE E.U./dL — AB
pH, UA: 6 (ref 5.0–8.0)

## 2020-08-21 NOTE — Patient Instructions (Signed)
Urinalysis today  We will refer you for scrotal ultrasound to further evaluation right scrotal pain.  Continue current medicines. Return as needed or in 6 months for physical.

## 2020-08-21 NOTE — Progress Notes (Signed)
Patient ID: [name], male    DOB: 01/31/1941, 79 y.o.   MRN: [id number]  This visit was conducted in person.  BP 140/60 (BP Location: Right Arm, Patient Position: Sitting, Cuff Size: Large)    Pulse 65    Temp (!) 96.7 F (35.9 C) (Temporal)    Ht 6' (1.829 m)    Wt 217 lb 1.6 oz (98.5 kg)    SpO2 95%    BMI 29.44 kg/m    CC: 6 mo f/u visit  Subjective:   HPI: [name] is a 80 y.o. male presenting on 08/21/2020 for Follow-up (X6 mos)   Overall stable period.  Noticing R groin pain without bulge over the last 2 wks. No abd pain, nausea/vomiting, diarrhea. Some constipation - managing with stool softener - achieving 1 soft stool/day. No recent straining or heavy lifting.   Recent bilateral redo laminectomy/laminotomy/foraminotomies/medial facetectomy (instrumentation and fusion) to decompress the bilateral L4 and L5 nerve roots [name]) 07/06/2020. Ongoing slow recovery - managing with tylenol and PRN hydrocodone. Oxycodone is too strong. Notes some improvement in neuropathy after surgery.   Known PAD (bilateral calcified SFA disease), arthritis, periph neuropathy - leg pain thought to be pseudoclaudication and periph neuropathy related.   Has seen pulm for COPD - trial anoro elipta - didn't note significant benefit. No recent albuterol use in months. Has f/u next week.  Has seen cardiology for PAD and known CAD s/p 3v CABG 1990 - lexiscan myoview 05/2020 returned low risk probably normal study.   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      Relevant past medical, surgical, family and social history reviewed and updated as indicated. Interim medical history since our last visit reviewed. Allergies and medications reviewed and updated. Outpatient Medications Prior to Visit  Medication Sig Dispense Refill   albuterol (VENTOLIN HFA) 108 (90 Base) MCG/ACT inhaler Inhale 2 puffs into the lungs every 6 (six) hours as needed for  wheezing or shortness of breath. 18 g 3   aspirin EC 81 MG EC tablet Take 1 tablet (81 mg total) by mouth daily. 30 tablet 0   Cyanocobalamin (B-12) 1000 MCG SUBL Place 1 tablet under the tongue daily. (Patient taking differently: Place 1,000 mcg under the tongue daily.) 30 each    cyclobenzaprine (FLEXERIL) 10 MG tablet Take 1 tablet (10 mg total) by mouth 3 (three) times daily as needed for muscle spasms. 30 tablet 0   docusate sodium (COLACE) 100 MG capsule Take 1 capsule (100 mg total) by mouth 2 (two) times daily. 60 capsule 0   ezetimibe (ZETIA) 10 MG tablet Take 1 tablet (10 mg total) by mouth daily. 90 tablet 3   fluticasone (FLONASE) 50 MCG/ACT nasal spray Place 2 sprays into both nostrils daily. (Patient taking differently: Place 2 sprays into both nostrils daily as needed for allergies.) 16 g 0   furosemide (LASIX) 20 MG tablet TAKE ONE TABLET BY MOUTH EVERY DAY AS NEEDED (Patient taking differently: Take 20 mg by mouth daily as needed for edema.) 30 tablet 3   gabapentin (NEURONTIN) 300 MG capsule TAKE 1 CAPSULE BY MOUTH THREE TIMES DAILY (Patient taking differently: Take 300 mg by mouth 3 (three) times daily.) 270 capsule 1   HYDROcodone-acetaminophen (NORCO/VICODIN) 5-325 MG tablet Take 1-2 tablets by mouth every 4 (four) hours as needed. for pain     hydrocortisone 2.5 % lotion APPLY TO AFFECTED AREAS ON SKIN AS DIRECTED DAILY TUESDAY  THURSDAY AND  SATURDAY. (Patient taking differently: Apply 1 application topically daily as needed (rash).) 59 mL 3   isosorbide mononitrate (IMDUR) 30 MG 24 hr tablet TAKE 1 TABLET BY MOUTH AT BEDTIME 90 tablet 0   ketoconazole (NIZORAL) 2 % cream APPLY TO THE AFFECTED AREA ON FACE ONCE DAILY FOR 2 WEEKS THEN ONCE DAILY UP TO 3 DAYS PER WEEK (Patient taking differently: Apply 1 application topically daily as needed (rash).) 15 g 3   losartan (COZAAR) 25 MG tablet TAKE 1 TABLET BY MOUTH DAILY (Patient taking differently: Take 25 mg by mouth  daily.) 90 tablet 0   metoprolol succinate (TOPROL-XL) 25 MG 24 hr tablet TAKE 1/2 TABLET EVERYDAY (Patient taking differently: Take 12.5 mg by mouth daily.) 45 tablet 1   nitroGLYCERIN (NITROSTAT) 0.4 MG SL tablet Place 1 tablet (0.4 mg total) under the tongue every 5 (five) minutes as needed for chest pain. 25 tablet 3   omeprazole (PRILOSEC) 20 MG capsule Take 1 capsule (20 mg total) by mouth daily. 30 capsule 4   oxyCODONE-acetaminophen (PERCOCET/ROXICET) 5-325 MG tablet Take 1-2 tablets by mouth every 4 (four) hours as needed for moderate pain. 30 tablet 0   polyethylene glycol (MIRALAX / GLYCOLAX) 17 g packet Take 17 g by mouth daily as needed for mild constipation.      ranolazine (RANEXA) 500 MG 12 hr tablet TAKE ONE TABLET TWICE DAILY (Patient taking differently: Take 500 mg by mouth every evening.) 180 tablet 2   simvastatin (ZOCOR) 40 MG tablet Take 1 tablet (40 mg total) by mouth daily. (Patient taking differently: Take 40 mg by mouth every evening.) 90 tablet 3   tamsulosin (FLOMAX) 0.4 MG CAPS capsule Take 0.4 mg by mouth at bedtime.     tolterodine (DETROL LA) 4 MG 24 hr capsule Take 4 mg by mouth daily.     No facility-administered medications prior to visit.     Per HPI unless specifically indicated in ROS section below Review of Systems Objective:  BP 140/60 (BP Location: Right Arm, Patient Position: Sitting, Cuff Size: Large)    Pulse 65    Temp (!) 96.7 F (35.9 C) (Temporal)    Ht 6' (1.829 m)    Wt 217 lb 1.6 oz (98.5 kg)    SpO2 95%    BMI 29.44 kg/m   Wt Readings from Last 3 Encounters:  08/21/20 217 lb 1.6 oz (98.5 kg)  07/15/20 223 lb 8 oz (101.4 kg)  07/02/20 218 lb 3.2 oz (99 kg)      Physical Exam Vitals and nursing note reviewed.  Constitutional:      Appearance: Normal appearance. He is not ill-appearing.  Cardiovascular:     Rate and Rhythm: Normal rate and regular rhythm.     Pulses: Normal pulses.     Heart sounds: Normal heart sounds. No  murmur heard.   Pulmonary:     Effort: Pulmonary effort is normal. No respiratory distress.     Breath sounds: Normal breath sounds. No wheezing, rhonchi or rales.  Abdominal:     General: Abdomen is flat. Bowel sounds are normal. There is no distension.     Palpations: Abdomen is soft. There is no mass.     Tenderness: There is no abdominal tenderness. There is no right CVA tenderness, left CVA tenderness, guarding or rebound.     Hernia: No hernia is present. There is no hernia in the left inguinal area or right inguinal area.  Genitourinary:    Pubic Area: No  rash.      Penis: Normal and uncircumcised.      Testes:        Right: Tenderness and testicular hydrocele (tender) present. Mass not present.        Left: Mass, tenderness or testicular hydrocele not present.     Epididymis:     Right: Inflamed.     Left: Not inflamed.     Comments: Tender swelling to R scrotum separate and superior to right testicle  Musculoskeletal:     Right lower leg: No edema.     Left lower leg: No edema.     Comments: Mild discomfort at R groin with int/ext rotation of R hip, no pain with manipulation of L hip  Lymphadenopathy:     Lower Body: Right inguinal adenopathy present. No left inguinal adenopathy.  Skin:    General: Skin is warm and dry.     Findings: No rash.  Neurological:     Mental Status: He is alert.  Psychiatric:        Mood and Affect: Mood normal.        Behavior: Behavior normal.       Results for orders placed or performed in visit on 08/21/20  POCT urinalysis dipstick  Result Value Ref Range   Color, UA yellow yellow   Clarity, UA clear clear   Glucose, UA negative negative mg/dL   Bilirubin, UA negative negative   Ketones, POC UA negative negative mg/dL   Spec Grav, UA 1.015 1.010 - 1.025   Blood, UA small (A) negative   pH, UA 6.0 5.0 - 8.0   Protein Ur, POC negative negative mg/dL   Urobilinogen, UA negative (A) 0.2 or 1.0 E.U./dL   Nitrite, UA Negative  Negative   Leukocytes, UA Negative Negative   Assessment & Plan:  This visit occurred during the SARS-CoV-2 public health emergency.  Safety protocols were in place, including screening questions prior to the visit, additional usage of staff PPE, and extensive cleaning of exam room while observing appropriate contact time as indicated for disinfecting solutions.   Problem List Items Addressed This Visit    PVD (peripheral vascular disease) with claudication ([place])    Regularly sees vascular.  Continues aspirin and statin.       Lumbar stenosis with neurogenic claudication   Lumbar disc disease with radiculopathy    Recent redo lumbar surgery [name] 06/2020).  Continues recovering.  Managing pain with tylenol and PRN hydrocodone.       COPD (chronic obstructive pulmonary disease) ([place])    Has established with pulmonology, didn't find significant benefit on anoro elipta. Planning pulm f/u later this month.       Acute pain in scrotum - Primary    2 wks of R groin pain, no evidence of hernia, doubt hip arthritis. Exam suspicious for tender inflamed R hydrocele/varicocele as well as possible inflamed epididymis on right however pt endorses h/o L hydrocele. Check scrotal US.  UA today overall normal, culture sent. Low threshold to start antibiotic to cover epididymitis.       Relevant Orders   US SCROTUM W/DOPPLER   POCT urinalysis dipstick (Completed)   Urine Culture       No orders of the defined types were placed in this encounter.  Orders Placed This Encounter  Procedures   Urine Culture   US SCROTUM W/DOPPLER    Standing Status:   Future    Standing Expiration Date:   08/21/2021    Order Specific  Question:   Reason for Exam (SYMPTOM  OR DIAGNOSIS REQUIRED)    Answer:   R scrotal pain ?enlarged hydrocele    Order Specific Question:   Preferred imaging location?    Answer:   [facility]   POCT urinalysis dipstick    Patient Instructions  Urinalysis today  We  will refer you for scrotal ultrasound to further evaluation right scrotal pain.  Continue current medicines. Return as needed or in 6 months for physical.   Follow up plan: Return in about 6 months (around 02/18/2021) for annual exam, prior fasting for blood work, medicare wellness visit.  Ria Bush, MD

## 2020-08-22 DIAGNOSIS — N5082 Scrotal pain: Secondary | ICD-10-CM | POA: Insufficient documentation

## 2020-08-22 LAB — URINE CULTURE
MICRO NUMBER:: 11394474
SPECIMEN QUALITY:: ADEQUATE

## 2020-08-22 NOTE — Assessment & Plan Note (Addendum)
2 wks of R groin pain, no evidence of hernia, doubt hip arthritis. Exam suspicious for tender inflamed R hydrocele/varicocele as well as possible inflamed epididymis on right however pt endorses h/o L hydrocele. Check scrotal US.  UA today overall normal, culture sent. Low threshold to start antibiotic to cover epididymitis.

## 2020-08-22 NOTE — Assessment & Plan Note (Signed)
Has established with pulmonology, didn't find significant benefit on anoro elipta. Planning pulm f/u later this month.

## 2020-08-22 NOTE — Assessment & Plan Note (Signed)
Recent redo lumbar surgery [name] 06/2020).  Continues recovering.  Managing pain with tylenol and PRN hydrocodone.

## 2020-08-22 NOTE — Assessment & Plan Note (Signed)
Regularly sees vascular.  Continues aspirin and statin.

## 2020-08-26 ENCOUNTER — Ambulatory Visit
Admission: RE | Admit: 2020-08-26 | Discharge: 2020-08-26 | Disposition: A | Discharge status: Home / Self Care | Payer: PPO | Source: Ambulatory Visit | Attending: Family Medicine | Admitting: Family Medicine

## 2020-08-26 ENCOUNTER — Ambulatory Visit: Payer: PPO | Admitting: Pulmonary Disease

## 2020-08-26 ENCOUNTER — Other Ambulatory Visit: Payer: Self-pay

## 2020-08-26 ENCOUNTER — Encounter: Payer: Self-pay | Admitting: Pulmonary Disease

## 2020-08-26 VITALS — BP 124/80 | HR 74 | Temp 97.0°F | Ht 72.0 in | Wt 214.8 lb

## 2020-08-26 DIAGNOSIS — R0602 Shortness of breath: Secondary | ICD-10-CM | POA: Diagnosis not present

## 2020-08-26 DIAGNOSIS — J449 Chronic obstructive pulmonary disease, unspecified: Secondary | ICD-10-CM | POA: Diagnosis not present

## 2020-08-26 DIAGNOSIS — N503 Cyst of epididymis: Secondary | ICD-10-CM | POA: Diagnosis not present

## 2020-08-26 DIAGNOSIS — N5082 Scrotal pain: Secondary | ICD-10-CM | POA: Diagnosis not present

## 2020-08-26 DIAGNOSIS — Z8616 Personal history of COVID-19: Secondary | ICD-10-CM

## 2020-08-26 DIAGNOSIS — N433 Hydrocele, unspecified: Secondary | ICD-10-CM | POA: Diagnosis not present

## 2020-08-26 NOTE — Progress Notes (Signed)
   Subjective:    Patient ID: [name], male    DOB: May 05, 1941, 80 y.o.   MRN: [id number]  HPI [name] "[name]" is 80 year old former smoker follow-up on the issue of shortness of breath after COVID-19 infection in October 2020. He had a prolonged course with his COVID requiring 2 hospitalizations due to relapsing symptoms. 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 We initially evaluated him here on July 2021. At that time pulmonary function testing was ordered and did not show overt obstruction. 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 The patient has done well with rare use of albuterol inhaler. He uses this "once in a blue moon" he is not on maintenance inhalers and does not wish to go on maintenance inhalers. He recently had back surgery by Dr. [name]. He is still recovering from that but had no pulmonary issues postsurgically. He has not had any fevers, chills or sweats and no other symptomatology. Most recent chest x-ray was on December 2 and was clear. He voices no other active complaint. He feels well and looks well.  Review of Systems A 10 point review of systems was performed and it is as noted above otherwise negative.    Objective:   Physical Exam BP 124/80 (BP Location: Left Arm, Cuff Size: Normal)   Pulse 74   Temp (!) 97 F (36.1 C) (Temporal)   Ht 6' (1.829 m)   Wt 214 lb 12.8 oz (97.4 kg)   SpO2 96%   BMI 29.13 kg/m   GENERAL: This is a well-developed, overweight gentleman who is awake, alert, no acute distress.   Ambulating with walker due to recent back surgery. HEAD: Normocephalic, atraumatic.  EYES: Pupils equal, round, reactive to light.  No scleral icterus.  MOUTH: Nose/mouth/throat not examined due to masking requirements for COVID 19. NECK: Supple. No thyromegaly. Trachea midline. No JVD.  No adenopathy. PULMONARY: Good air entry bilaterally, no adventitious  sounds.  CARDIOVASCULAR: S1 and S2. Regular rate and rhythm.  Grade 1/6 systolic ejection murmur left sternal border. GASTROINTESTINAL: Protuberant abdomen, soft, no tenderness.   MUSCULOSKELETAL: No joint deformity, no clubbing, no edema.  NEUROLOGIC: No overt focal deficits noted.  Speech is fluent.   SKIN: Intact,warm,dry.  Limited exam shows no rashes.  PSYCH: Mood and behavior normal.      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 Shortness of breath  R06.02    Post COVID Symptom has resolved  3. Personal history of COVID-19  Z86.16    No overt sequela   We will see the patient on an as-needed basis. Releasing back to the care of his primary care physician Dr. Danise Mina. Happy to see again as needed.   Renold Don, MD [facility]   *This note was dictated using voice recognition software/Dragon.  Despite best efforts to proofread, errors can occur which can change the meaning.  Any change was purely unintentional.

## 2020-08-26 NOTE — Patient Instructions (Signed)
You are doing well from the pulmonary standpoint. We will see you as needed.

## 2020-09-01 ENCOUNTER — Ambulatory Visit: Payer: PPO | Admitting: Dermatology

## 2020-09-01 ENCOUNTER — Other Ambulatory Visit: Payer: Self-pay

## 2020-09-01 DIAGNOSIS — L578 Other skin changes due to chronic exposure to nonionizing radiation: Secondary | ICD-10-CM | POA: Diagnosis not present

## 2020-09-01 DIAGNOSIS — D692 Other nonthrombocytopenic purpura: Secondary | ICD-10-CM | POA: Diagnosis not present

## 2020-09-01 DIAGNOSIS — L820 Inflamed seborrheic keratosis: Secondary | ICD-10-CM

## 2020-09-01 DIAGNOSIS — L570 Actinic keratosis: Secondary | ICD-10-CM | POA: Diagnosis not present

## 2020-09-01 NOTE — Patient Instructions (Signed)

## 2020-09-01 NOTE — Progress Notes (Signed)
   Follow-Up Visit   Subjective  [name] is a 80 y.o. male who presents for the following: Other (Spots of face and ears that feel scaly).  The following portions of the chart were reviewed this encounter and updated as appropriate:   Tobacco  Allergies  Meds  Problems  Med Hx  Surg Hx  Fam Hx      Review of Systems:  No other skin or systemic complaints except as noted in HPI or Assessment and Plan.  Objective  Well appearing patient in no apparent distress; mood and affect are within normal limits.  A focused examination was performed including face, ears, arms/hands. Relevant physical exam findings are noted in the Assessment and Plan.  Objective  face/ears x 5, right hand x 1 (6): Erythematous thin papules/macules with gritty scale.   Objective  Left Forearm - Posterior: Erythematous keratotic or waxy stuck-on papule or plaque.    Assessment & Plan    Actinic Damage - chronic, secondary to cumulative UV radiation exposure/sun exposure over time - diffuse scaly erythematous macules with underlying dyspigmentation - Recommend daily broad spectrum sunscreen SPF 30+ to sun-exposed areas, reapply every 2 hours as needed.  - Call for new or changing lesions.  Purpura - Chronic; persistent and recurrent.  Treatable, but not curable. - Violaceous macules and patches - Benign - Related to trauma, age, sun damage and/or use of blood thinners, chronic use of topical and/or oral steroids - Observe - Can use OTC arnica containing moisturizer such as Dermend Bruise Formula if desired - Call for worsening or other concerns  AK (actinic keratosis) (6) face/ears x 5, right hand x 1  Destruction of lesion - face/ears x 5, right hand x 1 Complexity: simple   Destruction method: cryotherapy   Informed consent: discussed and consent obtained   Timeout:  patient name, date of birth, surgical site, and procedure verified Lesion destroyed using liquid nitrogen: Yes    Region frozen until ice ball extended beyond lesion: Yes   Outcome: patient tolerated procedure well with no complications   Post-procedure details: wound care instructions given    Inflamed seborrheic keratosis Left Forearm - Posterior  Destruction of lesion - Left Forearm - Posterior Complexity: simple   Destruction method: cryotherapy   Informed consent: discussed and consent obtained   Timeout:  patient name, date of birth, surgical site, and procedure verified Lesion destroyed using liquid nitrogen: Yes   Region frozen until ice ball extended beyond lesion: Yes   Outcome: patient tolerated procedure well with no complications   Post-procedure details: wound care instructions given    Return for Follow up as scheduled.  I, Ashok Cordia, CMA, am acting as scribe for Sarina Ser, MD .  Documentation: I have reviewed the above documentation for accuracy and completeness, and I agree with the above.  Sarina Ser, MD

## 2020-09-03 ENCOUNTER — Encounter: Payer: Self-pay | Admitting: Dermatology

## 2020-09-07 ENCOUNTER — Telehealth: Payer: Self-pay

## 2020-09-07 NOTE — Chronic Care Management (AMB) (Signed)
Chronic Care Management Pharmacy Assistant   Name: [name]  MRN: [id number] DOB: 1941-07-31  Reason for Encounter: CCM Follow up Appointment Reminder    Patient Questions:  1.  Have you seen any other providers since your last visit? Yes 09/01/20- Dr. Sarina Ser- Dermatology 08/26/20- Dr. Vernard Gambles- Pulmonology 08/21/20- Dr. Ria Bush- PCP 07/16/20- ED visit due to right leg swelling.  07/15/20- Dr. Ria Bush- PCP 07/06/20- Dr. [name]- Neurosurgery- Lumbar operation 06/24/20- Gerrit Halls, PA-C- Orthopedics 06/10/20- Gerrit Halls, PA-C Orthopedics 06/02/20- Dr. Kathlyn Sacramento- Cardiology 05/29/20- Alma Friendly, NP- Family medicine 05/21/20- Dr. Rexene Alberts- Urology 04/29/20- Dr. Kary Kos- Neurosurgery 04/27/20- Dr. Ida Rogue- Cardiology 04/21/20- Dr. Lenord Carbo- Anesthesiology 04/07/20- Dr. Daylene Katayama- Podiatry 04/07/20- Dr. Vernard Gambles- Pulmonology 02/25/20- Dr. Vernard Gambles- Pulmonology   2.  Any changes in your medicines or health? Yes 04/27/20- Dr. Rockey Situ discontinued spiriva inhaler 04/07/20- Dr. Patsey Berthold started patient on Anoro-Ellipta 02/29/20- Dr. Patsey Berthold discontinued Spiriva inhaler, started patient on omeprazole.     PCP : Ria Bush, MD  Allergies:   Allergies  Allergen Reactions  . Vioxx [Rofecoxib] Other (See Comments)    Hemorrhage   . Spiriva Respimat [Tiotropium Bromide Monohydrate] Other (See Comments)    Elevated bp  . Contrast Media [Iodinated Diagnostic Agents] Itching and Rash    Delayed reaction post abdominal aortagram.   . Morphine Nausea Only and Other (See Comments)    Irritability     Medications: Outpatient Encounter Medications as of 09/07/2020  Medication Sig Note  . albuterol (VENTOLIN HFA) 108 (90 Base) MCG/ACT inhaler Inhale 2 puffs into the lungs every 6 (six) hours as needed for wheezing or shortness of breath.   Marland Kitchen aspirin EC 81 MG EC tablet Take 1 tablet (81 mg total)  by mouth daily. 06/30/2020: On hold for procedure  . Cyanocobalamin (B-12) 1000 MCG SUBL Place 1 tablet under the tongue daily. (Patient taking differently: Place 1,000 mcg under the tongue daily.)   . cyclobenzaprine (FLEXERIL) 10 MG tablet Take 1 tablet (10 mg total) by mouth 3 (three) times daily as needed for muscle spasms.   Marland Kitchen docusate sodium (COLACE) 100 MG capsule Take 1 capsule (100 mg total) by mouth 2 (two) times daily.   Marland Kitchen ezetimibe (ZETIA) 10 MG tablet Take 1 tablet (10 mg total) by mouth daily.   . fluticasone (FLONASE) 50 MCG/ACT nasal spray Place 2 sprays into both nostrils daily. (Patient taking differently: Place 2 sprays into both nostrils daily as needed for allergies.)   . furosemide (LASIX) 20 MG tablet TAKE ONE TABLET BY MOUTH EVERY DAY AS NEEDED (Patient taking differently: Take 20 mg by mouth daily as needed for edema.)   . gabapentin (NEURONTIN) 300 MG capsule TAKE 1 CAPSULE BY MOUTH THREE TIMES DAILY (Patient taking differently: Take 300 mg by mouth 3 (three) times daily.)   . HYDROcodone-acetaminophen (NORCO/VICODIN) 5-325 MG tablet Take 1-2 tablets by mouth every 4 (four) hours as needed. for pain   . hydrocortisone 2.5 % lotion APPLY TO AFFECTED AREAS ON SKIN AS DIRECTED DAILY TUESDAY  THURSDAY AND SATURDAY. (Patient taking differently: Apply 1 application topically daily as needed (rash).) 06/30/2020: Alternates Hydrocortisone lotion with ketoconazole cream when needed for a rash  . isosorbide mononitrate (IMDUR) 30 MG 24 hr tablet TAKE 1 TABLET BY MOUTH AT BEDTIME   . ketoconazole (NIZORAL) 2 % cream APPLY TO THE AFFECTED AREA ON FACE ONCE DAILY FOR 2 WEEKS THEN ONCE DAILY UP TO 3 DAYS  PER WEEK (Patient taking differently: Apply 1 application topically daily as needed (rash).) 06/30/2020: Alternates Hydrocortisone lotion with ketoconazole cream when needed for a rash  . losartan (COZAAR) 25 MG tablet TAKE 1 TABLET BY MOUTH DAILY (Patient taking differently: Take 25 mg by  mouth daily.)   . metoprolol succinate (TOPROL-XL) 25 MG 24 hr tablet TAKE 1/2 TABLET EVERYDAY (Patient taking differently: Take 12.5 mg by mouth daily.)   . nitroGLYCERIN (NITROSTAT) 0.4 MG SL tablet Place 1 tablet (0.4 mg total) under the tongue every 5 (five) minutes as needed for chest pain.   Marland Kitchen omeprazole (PRILOSEC) 20 MG capsule Take 1 capsule (20 mg total) by mouth daily.   Marland Kitchen oxyCODONE-acetaminophen (PERCOCET/ROXICET) 5-325 MG tablet Take 1-2 tablets by mouth every 4 (four) hours as needed for moderate pain.   . polyethylene glycol (MIRALAX / GLYCOLAX) 17 g packet Take 17 g by mouth daily as needed for mild constipation.    . ranolazine (RANEXA) 500 MG 12 hr tablet TAKE ONE TABLET TWICE DAILY (Patient taking differently: Take 500 mg by mouth every evening.)   . simvastatin (ZOCOR) 40 MG tablet Take 1 tablet (40 mg total) by mouth daily. (Patient taking differently: Take 40 mg by mouth every evening.)   . tamsulosin (FLOMAX) 0.4 MG CAPS capsule Take 0.4 mg by mouth at bedtime. 06/30/2020: Pt states he doesn't feel right when he takes this, on hold until pt sees dr.   . tolterodine (DETROL LA) 4 MG 24 hr capsule Take 4 mg by mouth daily.    No facility-administered encounter medications on file as of 09/07/2020.    Current Diagnosis: Patient Active Problem List   Diagnosis Date Noted  . Acute pain in scrotum 08/22/2020  . Spondylolisthesis of lumbar region 07/07/2020  . COPD (chronic obstructive pulmonary disease) (HCC) 01/14/2020  . Tick bite of left thigh 12/05/2019  . Coccydynia 09/20/2019  . 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 Anxiety 06/04/2019  . Hand swelling 12/07/2018  . Chronic right shoulder pain 09/10/2018  . Fall 03/08/2018  . Cervical stenosis of spinal canal 06/05/2017  . Pedal edema 06/05/2017  . Health maintenance examination 12/06/2016  . 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 Overweight (BMI 25.0-29.9) 07/04/2016  . Low  vitamin B12 level 01/01/2016  . Medicare annual wellness visit, subsequent 07/03/2015  . Advanced care planning/counseling discussion 07/03/2015  . Prediabetes 05/04/2015  . Chest pain 05/04/2015  . Ex-smoker 05/04/2015  . 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 Spermatocele 04/02/2013  . Syncopal vertigo 11/04/2010  . Lumbar disc disease with radiculopathy 11/04/2010  . 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 PVD (peripheral vascular disease) with claudication (HCC) 08/11/2009  . 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 Carotid stenosis 07/09/2008  . 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 Essential hypertension 04/30/2007  . GERD 04/30/2007  . Hyperlipidemia 04/26/2007  . OA (osteoarthritis) of knee 04/26/2007  . DVT, HX OF 04/26/2007    Contacted patient ahead of his visit with CPP, Phil Dopp, [address]. D to review any health or medication changes. Patient stated only changes to health or medications are that Dr. Jayme Cloud stated he did not need to see her any more, he could follow up with Dr. Sharen Hones. He is only using albuterol as needed for COPD.    Are you having any problems with your medications? No    What concerns would like to discuss with the pharmacist?   Patient  reminded to have all medications, supplements and any blood sugar and blood pressure readings available for review with Debbora Dus, Pharm. D, at their telephone visit on 09/10/20.     Follow-Up:  Pharmacist Review   Debbora Dus, CPP notified  Margaretmary Dys, [place] Pharmacy Assistant [phone]

## 2020-09-09 DIAGNOSIS — Z961 Presence of intraocular lens: Secondary | ICD-10-CM | POA: Diagnosis not present

## 2020-09-09 NOTE — Chronic Care Management (AMB) (Deleted)
Chronic Care Management Pharmacy  Name: [name]  MRN: [id number] DOB: Jul 04, 1941  Chief Complaint/ HPI  [name],  80 y.o. , male presents for their Initial CCM visit with the clinical pharmacist via telephone.  PCP : Ria Bush, MD  Their chronic conditions include: hypertension, CAD, PVD, COPD, GERD, osteoarthritis, BPH, HLD, pre-diabetes, low B12, cardiomyopathy due to COVID-19   Office Visits:   01/14/20: PCP visit - Seen last month at [facility] ER with bronchitis/COPD exacerbation treated with neb, steroid course. Referred to pulmonary, appt 07/21. Using albuterol 2-3 times per day, start Spiriva.  Consult Visit:  12/18/19: ED - bronchitis  11/28/19: Cardiology - continue current meds, rtc 6 months  COVID-19 05/20/19, hospitalized for 20 days   Medications: Outpatient Encounter Medications as of 09/10/2020  Medication Sig Note  . albuterol (VENTOLIN HFA) 108 (90 Base) MCG/ACT inhaler Inhale 2 puffs into the lungs every 6 (six) hours as needed for wheezing or shortness of breath.   Marland Kitchen aspirin EC 81 MG EC tablet Take 1 tablet (81 mg total) by mouth daily. 06/30/2020: On hold for procedure  . Cyanocobalamin (B-12) 1000 MCG SUBL Place 1 tablet under the tongue daily. (Patient taking differently: Place 1,000 mcg under the tongue daily.)   . cyclobenzaprine (FLEXERIL) 10 MG tablet Take 1 tablet (10 mg total) by mouth 3 (three) times daily as needed for muscle spasms.   Marland Kitchen docusate sodium (COLACE) 100 MG capsule Take 1 capsule (100 mg total) by mouth 2 (two) times daily.   Marland Kitchen ezetimibe (ZETIA) 10 MG tablet Take 1 tablet (10 mg total) by mouth daily.   . fluticasone (FLONASE) 50 MCG/ACT nasal spray Place 2 sprays into both nostrils daily. (Patient taking differently: Place 2 sprays into both nostrils daily as needed for allergies.)   . furosemide (LASIX) 20 MG tablet TAKE ONE TABLET BY MOUTH EVERY DAY AS NEEDED (Patient taking differently: Take 20 mg by mouth daily as  needed for edema.)   . gabapentin (NEURONTIN) 300 MG capsule TAKE 1 CAPSULE BY MOUTH THREE TIMES DAILY (Patient taking differently: Take 300 mg by mouth 3 (three) times daily.)   . HYDROcodone-acetaminophen (NORCO/VICODIN) 5-325 MG tablet Take 1-2 tablets by mouth every 4 (four) hours as needed. for pain   . hydrocortisone 2.5 % lotion APPLY TO AFFECTED AREAS ON SKIN AS DIRECTED DAILY TUESDAY  THURSDAY AND SATURDAY. (Patient taking differently: Apply 1 application topically daily as needed (rash).) 06/30/2020: Alternates Hydrocortisone lotion with ketoconazole cream when needed for a rash  . isosorbide mononitrate (IMDUR) 30 MG 24 hr tablet TAKE 1 TABLET BY MOUTH AT BEDTIME   . ketoconazole (NIZORAL) 2 % cream APPLY TO THE AFFECTED AREA ON FACE ONCE DAILY FOR 2 WEEKS THEN ONCE DAILY UP TO 3 DAYS PER WEEK (Patient taking differently: Apply 1 application topically daily as needed (rash).) 06/30/2020: Alternates Hydrocortisone lotion with ketoconazole cream when needed for a rash  . losartan (COZAAR) 25 MG tablet TAKE 1 TABLET BY MOUTH DAILY (Patient taking differently: Take 25 mg by mouth daily.)   . metoprolol succinate (TOPROL-XL) 25 MG 24 hr tablet TAKE 1/2 TABLET EVERYDAY (Patient taking differently: Take 12.5 mg by mouth daily.)   . nitroGLYCERIN (NITROSTAT) 0.4 MG SL tablet Place 1 tablet (0.4 mg total) under the tongue every 5 (five) minutes as needed for chest pain.   Marland Kitchen omeprazole (PRILOSEC) 20 MG capsule Take 1 capsule (20 mg total) by mouth daily.   Marland Kitchen oxyCODONE-acetaminophen (PERCOCET/ROXICET) 5-325 MG tablet  Take 1-2 tablets by mouth every 4 (four) hours as needed for moderate pain.   . polyethylene glycol (MIRALAX / GLYCOLAX) 17 g packet Take 17 g by mouth daily as needed for mild constipation.    . ranolazine (RANEXA) 500 MG 12 hr tablet TAKE ONE TABLET TWICE DAILY (Patient taking differently: Take 500 mg by mouth every evening.)   . simvastatin (ZOCOR) 40 MG tablet Take 1 tablet (40 mg  total) by mouth daily. (Patient taking differently: Take 40 mg by mouth every evening.)   . tamsulosin (FLOMAX) 0.4 MG CAPS capsule Take 0.4 mg by mouth at bedtime. 06/30/2020: Pt states he doesn't feel right when he takes this, on hold until pt sees dr.   . tolterodine (DETROL LA) 4 MG 24 hr capsule Take 4 mg by mouth daily.    No facility-administered encounter medications on file as of 09/10/2020.   Current Diagnosis/Assessment: Emergency planning/management officer Strain: Low Risk   . Difficulty of Paying Living Expenses: Not very hard   Goals Addressed   None    Hypertension   CMP Latest Ref Rng & Units 07/16/2020 07/07/2020 07/02/2020  Glucose 70 - 99 mg/dL 125(H) 137(H) 167(H)  BUN 8 - 23 mg/dL 26(H) 26(H) 24(H)  Creatinine 0.61 - 1.24 mg/dL 1.50(H) 1.42(H) 1.56(H)  Sodium 135 - 145 mmol/L 139 136 142  Potassium 3.5 - 5.1 mmol/L 4.5 4.6 4.2  Chloride 98 - 111 mmol/L 100 103 108  CO2 22 - 32 mmol/L 26 23 27   Calcium 8.9 - 10.3 mg/dL 9.1 8.6(L) 9.2  Total Protein 6.0 - 8.3 g/dL - - -  Total Bilirubin 0.2 - 1.2 mg/dL - - -  Alkaline Phos 39 - 117 U/L - - -  AST 0 - 37 U/L - - -  ALT 0 - 53 U/L - - -   Office blood pressures are: BP Readings from Last 3 Encounters:  08/26/20 124/80  08/21/20 140/60  07/16/20 (!) 133/56   Patient has failed these meds in the past: none reported Patient checks BP at home twice daily Patient home BP readings are ranging: last three days 132/66, 137/70, 124/57  BP goal < 140/90 mmHg Patient is currently controlled on the following medications:   Losartan 25 mg - 1 tablet daily  Metoprolol succinate 25 mg - 1/2 tablet daily  We discussed: reports both medications were started since he had COVID-19 in October 2020, closely managed by Dr. Rockey Situ, denies adverse effects  Plan: Continue current medications   Cardiomyopathy (due to COVID-19)   Most recent EF: 50% Patient has failed these meds in past: none Patient is currently controlled on the following  medications:   Metoprolol succinate 25 mg - 1/2 tablet daily  Furosemide 20 mg - 1 tablet daily PRN  We discussed: Pt is not currently weighing daily. Takes Lasix if swelling and has not taken any in the past week. Encouraged daily weighing to assess for 3 lb weight gain in 24 hours or 5 lb in 7 days.   Plan: Continue current medications   Hyperlipidemia/CAD   Lipid Panel     Component Value Date/Time   CHOL 109 06/04/2019 0540   TRIG 78 06/18/2019 2150   HDL 33 (L) 06/04/2019 0540   LDLCALC 62 06/04/2019 0540   LDLDIRECT 106.0 01/01/2016 0844     CBC Latest Ref Rng & Units 07/16/2020 07/07/2020 07/02/2020  WBC 4.0 - 10.5 K/uL 5.7 8.9 5.7  Hemoglobin 13.0 - 17.0 g/dL 11.3(L) 11.8(L) 13.8  Hematocrit  39.0 - 52.0 % 33.6(L) 35.0(L) 42.1  Platelets 150 - 400 K/uL 282 186 191   The ASCVD Risk score (Goff DC Jr., et al., 2013) failed to calculate for the following reasons:   The valid total cholesterol range is 130 to 320 mg/dL   LDL goal < 70 Patient has failed these meds in past: none  Patient is currently controlled on the following medications:  . Simvastatin 40 mg - 1 tablet daily . Ezetimibe 10 mg - 1 tablet daily  CAD:   Aspirin 81 mg - 1 tablet daily  Ranolazine 500 mg - 1 tablet BID  Nitroglycerin 0.4 mg SL - PRN  Isosorbide mononitrate 30 mg - 1 tablet daily at bedtime  We discussed: denies adverse effects, denies chest pain, CBC stable; confirms adherence to all of the above; hasn't taken nitroglycerin in past year  Plan: Continue current medications  COPD / Tobacco   Tobacco Status:  Social History   Tobacco Use  Smoking Status Former Smoker  . Packs/day: 1.00  . Years: 25.00  . Pack years: 25.00  . Types: Cigarettes  . Quit date: 08/15/1988  . Years since quitting: 32.0  Smokeless Tobacco Never Used   Reports initial appt with pulmonology tomorrow 7/13, diagnosed during ED visit 12/18/19 Per PCP note, CTA showed evidence of emphysema, endorses some  exertional dyspnea, previously worked at [employer], 25 year pack history quit 1990   Patient has failed these meds in past: none  Patient is currently uncontrolled on the following medications:   Spiriva 18 mcg - 1 capsule by inhalation daily  Albuterol 90 mcg - 2 puffs q6h PRN  Using maintenance inhaler regularly? No Frequency of rescue inhaler use:  1-2x per week (every 3-4 days)  We discussed: started on Spiriva 6/1 per PCP, pt stopped Spiriva in the past 3-4 days due to BP elevations (reports SBP 150-160s), now back down to 120s-130s. Also denies change in symptoms with Spiriva. Encouraged discussing with pulmonology at consult tomorrow.   Plan: Continue current medications   Pre-Diabetes   Recent Relevant Labs: Lab Results  Component Value Date/Time   HGBA1C 6.1 01/07/2020 09:06 AM   HGBA1C 5.7 (A) 09/25/2019 12:06 PM   HGBA1C 7.4 (H) 06/19/2019 01:45 PM   MICROALBUR <0.7 12/04/2018 11:33 AM   MICROALBUR <0.7 11/30/2017 10:35 AM    Checking BG: infrequently, previously checked regularly but always within range   Patient is currently controlled on the following medications:   No pharmacotherapy  We discussed: BG primarily elevated during hospital stay for [place] 05/2019 receiving steroids   Plan: Continue current medications   BPH   Patient has failed these meds in past: none reported Patient is currently controlled on the following medications:   Tolterodine 24 hr 4 mg - 1 capsule daily  Symptoms: reports good control on current therapy, urinating 5-7 times during the day, denies nocturnal disturbance, reports its very dependent on how much fluid he drinks during the day and when he takes Lasix, reports taking Tolterodine for 2-3 years  We discussed: potential anticholinergic side effects to watch for, medication on [place] list for adults > 65   Plan: Continue current medications   Vitamin B12 Deficiency   Last B12 WNL 11/2018 Patient has failed these meds  in past: none reported Patient is currently controlled on the following medications:   Vitamin B12 1000 mcg SL - 1 tablet daily  We discussed: confirmed adherence   Plan: Continue current medications   Lumbar  stenosis/disc disease   Patient has failed these meds in past: none reported  Patient is currently controlled on the following medications:   Gabapentin 300 mg - 1 capsule TID  Steroid injections - report last injection was for back pain ~ 1 month ago   We discussed: followed by neurosurgery, reports noticeable improvement with gabapentin  Plan: Continue current medications  Misc.   Patient is currently on the following misc/OTC medications:   Ketoconazle 2% cream (reports use PRN for rash on face)  MiraLAX PRN constipation (reports use less than once weekly)   Tylenol Arthritis 650 mg - 2 tablets daily at bedtime  (reports taking 2 tabs every night for leg pain)  Medications reviewed, appropriate to continue   Plan: Continue current medications  Medication Management  Pharmacy: [facility]  Adherence: Refill history reviewed, < 5 day gap for refills  Affordability: Denies cost concerns, reports Spiriva $45/month but able to afford at this time  CCM Follow Up: 6 months, telephone   Debbora Dus, PharmD Clinical Pharmacist [facility] [phone]

## 2020-09-10 ENCOUNTER — Telehealth: Payer: PPO

## 2020-09-10 ENCOUNTER — Telehealth: Payer: Self-pay

## 2020-09-10 NOTE — Chronic Care Management (AMB) (Addendum)
Chronic Care Management Pharmacy Assistant   Name: [name]  MRN: [id number] DOB: 1940-11-11  Reason for Encounter: Disease State- Hypertension/ Lipids  Patient Questions:  1.  Have you seen any other providers since your last visit? No  2.  Any changes in your medicines or health? 06/2020 States he had back surgery   PCP : Ria Bush, MD  Allergies:   Allergies  Allergen Reactions   Vioxx [Rofecoxib] Other (See Comments)    Hemorrhage    Spiriva Respimat [Tiotropium Bromide Monohydrate] Other (See Comments)    Elevated bp   Contrast Media [Iodinated Diagnostic Agents] Itching and Rash    Delayed reaction post abdominal aortagram.    Morphine Nausea Only and Other (See Comments)    Irritability     Medications: Outpatient Encounter Medications as of 09/10/2020  Medication Sig Note   albuterol (VENTOLIN HFA) 108 (90 Base) MCG/ACT inhaler Inhale 2 puffs into the lungs every 6 (six) hours as needed for wheezing or shortness of breath.    aspirin EC 81 MG EC tablet Take 1 tablet (81 mg total) by mouth daily. 06/30/2020: On hold for procedure   Cyanocobalamin (B-12) 1000 MCG SUBL Place 1 tablet under the tongue daily. (Patient taking differently: Place 1,000 mcg under the tongue daily.)    cyclobenzaprine (FLEXERIL) 10 MG tablet Take 1 tablet (10 mg total) by mouth 3 (three) times daily as needed for muscle spasms.    docusate sodium (COLACE) 100 MG capsule Take 1 capsule (100 mg total) by mouth 2 (two) times daily.    ezetimibe (ZETIA) 10 MG tablet Take 1 tablet (10 mg total) by mouth daily.    fluticasone (FLONASE) 50 MCG/ACT nasal spray Place 2 sprays into both nostrils daily. (Patient taking differently: Place 2 sprays into both nostrils daily as needed for allergies.)    furosemide (LASIX) 20 MG tablet TAKE ONE TABLET BY MOUTH EVERY DAY AS NEEDED (Patient taking differently: Take 20 mg by mouth daily as needed for edema.)    gabapentin (NEURONTIN) 300 MG  capsule TAKE 1 CAPSULE BY MOUTH THREE TIMES DAILY (Patient taking differently: Take 300 mg by mouth 3 (three) times daily.)    HYDROcodone-acetaminophen (NORCO/VICODIN) 5-325 MG tablet Take 1-2 tablets by mouth every 4 (four) hours as needed. for pain    hydrocortisone 2.5 % lotion APPLY TO AFFECTED AREAS ON SKIN AS DIRECTED DAILY TUESDAY  THURSDAY AND SATURDAY. (Patient taking differently: Apply 1 application topically daily as needed (rash).) 06/30/2020: Alternates Hydrocortisone lotion with ketoconazole cream when needed for a rash   isosorbide mononitrate (IMDUR) 30 MG 24 hr tablet TAKE 1 TABLET BY MOUTH AT BEDTIME    ketoconazole (NIZORAL) 2 % cream APPLY TO THE AFFECTED AREA ON FACE ONCE DAILY FOR 2 WEEKS THEN ONCE DAILY UP TO 3 DAYS PER WEEK (Patient taking differently: Apply 1 application topically daily as needed (rash).) 06/30/2020: Alternates Hydrocortisone lotion with ketoconazole cream when needed for a rash   losartan (COZAAR) 25 MG tablet TAKE 1 TABLET BY MOUTH DAILY (Patient taking differently: Take 25 mg by mouth daily.)    metoprolol succinate (TOPROL-XL) 25 MG 24 hr tablet TAKE 1/2 TABLET EVERYDAY (Patient taking differently: Take 12.5 mg by mouth daily.)    nitroGLYCERIN (NITROSTAT) 0.4 MG SL tablet Place 1 tablet (0.4 mg total) under the tongue every 5 (five) minutes as needed for chest pain.    omeprazole (PRILOSEC) 20 MG capsule Take 1 capsule (20 mg total) by mouth daily.  oxyCODONE-acetaminophen (PERCOCET/ROXICET) 5-325 MG tablet Take 1-2 tablets by mouth every 4 (four) hours as needed for moderate pain.    polyethylene glycol (MIRALAX / GLYCOLAX) 17 g packet Take 17 g by mouth daily as needed for mild constipation.     ranolazine (RANEXA) 500 MG 12 hr tablet TAKE ONE TABLET TWICE DAILY (Patient taking differently: Take 500 mg by mouth every evening.)    simvastatin (ZOCOR) 40 MG tablet Take 1 tablet (40 mg total) by mouth daily. (Patient taking differently: Take 40 mg by mouth  every evening.)    tamsulosin (FLOMAX) 0.4 MG CAPS capsule Take 0.4 mg by mouth at bedtime. 06/30/2020: Pt states he doesn't feel right when he takes this, on hold until pt sees dr.    tolterodine (DETROL LA) 4 MG 24 hr capsule Take 4 mg by mouth daily.    No facility-administered encounter medications on file as of 09/10/2020.    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   Anxiety 06/04/2019   Hand swelling 12/07/2018   Chronic right shoulder pain 09/10/2018   Fall 03/08/2018   Cervical stenosis of spinal canal 06/05/2017   Pedal edema 06/05/2017   Health maintenance examination 12/06/2016   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/13/2016   Overweight (BMI 25.0-29.9) 07/04/2016   Low vitamin B12 level 01/01/2016   Medicare annual wellness visit, subsequent 07/03/2015   Advanced care planning/counseling discussion 07/03/2015   Prediabetes 05/04/2015   Chest pain 05/04/2015   Ex-smoker 05/04/2015   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 Spoke with patient regarding BP  Recent Office Vitals: BP  Readings from Last 3 Encounters:  08/26/20 124/80  08/21/20 140/60  07/16/20 (!) 133/56   Pulse Readings from Last 3 Encounters:  08/26/20 74  08/21/20 65  07/16/20 85    Wt Readings from Last 3 Encounters:  08/26/20 214 lb 12.8 oz (97.4 kg)  08/21/20 217 lb 1.6 oz (98.5 kg)  07/15/20 223 lb 8 oz (101.4 kg)     Kidney Function Lab Results  Component Value Date/Time   CREATININE 1.50 (H) 07/16/2020 02:54 AM   CREATININE 1.42 (H) 07/07/2020 05:37 AM   CREATININE 1.26 08/10/2014 05:59 AM   CREATININE 1.28 08/07/2014 07:26 PM   GFR 54.74 (L) 01/07/2020 09:06 AM   GFRNONAA 47 (L) 07/16/2020 02:54 AM   GFRNONAA 60 (L) 08/10/2014 05:59 AM   GFRNONAA >60 07/03/2013 03:05 PM   GFRAA 57 (L) 12/18/2019 11:13 AM   GFRAA >60 08/10/2014 05:59 AM   GFRAA >60 07/03/2013 03:05 PM    BMP Latest Ref Rng & Units 07/16/2020 07/07/2020 07/02/2020  Glucose 70 - 99 mg/dL 456(Y) 563(S) 937(D)  BUN 8 - 23  mg/dL 26(H) 26(H) 24(H)  Creatinine 0.61 - 1.24 mg/dL 1.50(H) 1.42(H) 1.56(H)  BUN/Creat Ratio 10 - 24 - - -  Sodium 135 - 145 mmol/L 139 136 142  Potassium 3.5 - 5.1 mmol/L 4.5 4.6 4.2  Chloride 98 - 111 mmol/L 100 103 108  CO2 22 - 32 mmol/L 26 23 27   Calcium 8.9 - 10.3 mg/dL 9.1 8.6(L) 9.2    Current antihypertensive regimen:  Losartan 25 mg - 1 tablet daily Metoprolol succinate 25 mg - 1/2 tablet daily  How often are you checking your Blood Pressure? infrequently   Current home BP readings: Only has today's reading of 137/61, 74.  What recent interventions/DTPs have been made by any provider to improve Blood Pressure control since last CPP Visit:  Reviewed home blood pressure readings Comprehensive medication review  Any recent hospitalizations or ED visits since last visit with CPP? No   What diet changes have been made to improve Blood Pressure Control?  No changes in diet. Eats a lot of chicken and vegetables  What exercise is being done to improve your Blood Pressure  Control?  States he walks   Adherence Review: Is the patient currently on ACE/ARB medication? Yes Does the patient have >5 day gap between last estimated fill dates? No  Lipid Panel    Component Value Date/Time   CHOL 109 06/04/2019 0540   TRIG 78 06/18/2019 2150   HDL 33 (L) 06/04/2019 0540   LDLCALC 62 06/04/2019 0540   LDLDIRECT 106.0 01/01/2016 0844    10-year ASCVD risk score: The ASCVD Risk score Mikey Bussing DC Jr., et al., 2013) failed to calculate for the following reasons:   The valid total cholesterol range is 130 to 320 mg/dL  Current antihyperlipidemic regimen:  Simvastatin 40 mg - 1 tablet daily Ezetimibe 10 mg - 1 tablet daily  Previous antihyperlipidemic medications tried: None listed  ASCVD risk enhancing conditions: age >34 and HTN   What recent interventions/DTPs have been made by any provider to improve Cholesterol control since last CPP Visit: No recent interventions  Any recent hospitalizations or ED visits since last visit with CPP? No   What diet changes have been made to improve Cholesterol?  No changes in diet. Eats a lot of chicken and vegetables  What exercise is being done to improve Cholesterol?  States he walks  Adherence Review: Does the patient have >5 day gap between last estimated fill dates? No  Patient states he is doing well. Denies any issues with his medications. Notes that he saw a new urologist and was put on Flomax. He would prefer not to take flomax at this time but states he will discuss with urologist.   Follow-Up:  Pharmacist Review   Debbora Dus, CPP notified  Margaretmary Dys, [place] Assistant [phone]  I have reviewed the care management and care coordination activities outlined in this encounter and I am certifying that I agree with the content of this note. No further action required.  Debbora Dus, [facility] 09/10/20 4:34 PM

## 2020-10-01 ENCOUNTER — Encounter: Payer: Self-pay | Admitting: Nurse Practitioner

## 2020-10-01 ENCOUNTER — Other Ambulatory Visit: Payer: Self-pay

## 2020-10-01 ENCOUNTER — Telehealth: Payer: Self-pay | Admitting: Cardiovascular Disease

## 2020-10-01 ENCOUNTER — Ambulatory Visit: Payer: PPO | Admitting: Nurse Practitioner

## 2020-10-01 VITALS — BP 102/60 | HR 81 | Ht 72.0 in | Wt 218.2 lb

## 2020-10-01 DIAGNOSIS — I2510 Atherosclerotic heart disease of native coronary artery without angina pectoris: Secondary | ICD-10-CM | POA: Diagnosis not present

## 2020-10-01 DIAGNOSIS — I10 Essential (primary) hypertension: Secondary | ICD-10-CM

## 2020-10-01 DIAGNOSIS — E785 Hyperlipidemia, unspecified: Secondary | ICD-10-CM | POA: Diagnosis not present

## 2020-10-01 DIAGNOSIS — I739 Peripheral vascular disease, unspecified: Secondary | ICD-10-CM | POA: Diagnosis not present

## 2020-10-01 DIAGNOSIS — R0789 Other chest pain: Secondary | ICD-10-CM | POA: Diagnosis not present

## 2020-10-01 DIAGNOSIS — I5032 Chronic diastolic (congestive) heart failure: Secondary | ICD-10-CM

## 2020-10-01 NOTE — Telephone Encounter (Signed)
Spoke with the patient. Patient reports having increased left sided chest pressure, LE edema, and tightness in the abdomen. He denies sob, palpations, n/v, diaphoresis, orthopnea, or PND. He has been experiencing these symptoms over the last day or two. He did not provide specific readings buts sts that he VS have been ok. Patient describes left sided chest pressure that does not radiate, the cp is intermittent. The pressure occurs at rest or with exertion. Nothing makes it better or worse. He did take 1 Nitro with no relief.  He is prescribed Lasix 20 mg daily prn for edema. He has taken Lasix yesterday and today. He has had increase urine output. Patient would like to be seen today. Appt scheduled for today @3 :10 pm wit Ignacia Bayley, NP. Adv the patient to arrive 20 min prior to the appt to allow for check in and rooming.  Patient verbalized understanding and voiced appreciation for the assistance.

## 2020-10-01 NOTE — Telephone Encounter (Signed)
Pt c/o of Chest Pain: STAT if CP now or developed within 24 hours  1. Are you having CP right now?  No   2. Are you experiencing any other symptoms (ex. SOB, nausea, vomiting, sweating)? L side chest pain under breast area towards ribs patient describes pain as same every time and states resembles gas pain feeling but thinks is too high up in area   3. How long have you been experiencing CP? 1-2 days   4. Is your CP continuous or coming and going? Off and on every few minutes   5. Have you taken Nitroglycerin?  Yes did not help  ?   Patient reports Vitals normal

## 2020-10-01 NOTE — Patient Instructions (Signed)
Medication Instructions:  Your physician recommends that you continue on your current medications as directed. Please refer to the Current Medication list given to you today.  *If you need a refill on your cardiac medications before your next appointment, please call your pharmacy*  Follow-Up: At [facility], you and your health needs are our priority.  As part of our continuing mission to provide you with exceptional heart care, we have created designated Provider Care Teams.  These Care Teams include your primary Cardiologist (physician) and Advanced Practice Providers (APPs -  Physician Assistants and Nurse Practitioners) who all work together to provide you with the care you need, when you need it.  We recommend signing up for the patient portal called "MyChart".  Sign up information is provided on this After Visit Summary.  MyChart is used to connect with patients for Virtual Visits (Telemedicine).  Patients are able to view lab/test results, encounter notes, upcoming appointments, etc.  Non-urgent messages can be sent to your provider as well.   To learn more about what you can do with MyChart, go to [email or web address].    Your next appointment:   Keep follow up as scheduled.   The format for your next appointment:   In Person  Provider:   You may see Ida Rogue, MD or one of the following Advanced Practice Providers on your designated Care Team:    Murray Hodgkins, NP  Christell Faith, PA-C  Marrianne Mood, PA-C  Cadence Butler, [place]  Laurann [place], NP

## 2020-10-01 NOTE — Progress Notes (Signed)
Office Visit    Patient Name: [name] Date of Encounter: 10/01/2020  Primary Care Provider:  Ria Bush, MD Primary Cardiologist:  Ida Rogue, MD  Chief Complaint    80 y/o ? 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  Past Medical History    Past Medical History:  Diagnosis Date  . 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 06/2019 Echo (in setting of COVID): EF 35-40%, mild LVH, g1 DD, glob HK; b. 09/2019 Echo: eF 50-55%, no rwma, Gr1 DD, nl RV size/fxn.  Marland Kitchen Actinic keratosis   . Anginal pain ([place])   . BENIGN PROSTATIC HYPERTROPHY, WITH URINARY OBSTRUCTION 09/06/2007  . CAD s/p CABG    a. 1990 s/p MI-->CABG x 2; b. 2002 s/p BMS to LCX; c. 06/2015 Cath: LM 50ost, LAD 100ost/p, 96m/d, D2 nl, LCX  patent stent, RCA 80p (small), LIMA->LAD nl, VG->D2 nl-->Med Rx; d. 05/2020 MV: fixed apical ant/apical defect. No signif ischemia.  . Carotid arterial disease ([place])    a. 08/2016 Carotid U/S: <39% bilat.  . 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 Community acquired pneumonia of right lower lobe of lung 06/05/2017  . COVID-19 virus infection 05/30/2019   Covid PNA with hospitalization 05/2019  . 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 DVT, HX OF 1998  . Emphysema of lung ([place])   . GERD 04/30/2007  . Headache    hx migraines  . History of hiatal hernia   . History of shingles   . HYPERLIPIDEMIA 04/26/2007  . HYPERTENSION 04/30/2007  . INGUINAL HERNIA, RIGHT 05/26/2010  . Laceration of skin of left hand 03/08/2018  . Lumbar disc disease with radiculopathy    neuropathy in feet  . Myocardial infarction ([place])    1989, 2002  . OSA (obstructive sleep apnea)    a. did not tolerate CPAP.   Marland Kitchen Osteoarthritis   . PAD (peripheral artery disease) ([place])    a. 07/2017 LE duplex: RSFA 75-69m, LSFA 75-46m, 50-74d; c. 08/2018 Periph Angio: No signif AoIliac dzs. Mod-sev Ca2+ RSFA w/ diff dzs throughout- 3 vessel runoff. Borderline signif LSFA dzs w/ mod-sev Ca2+ vessels and 3 vessel runoff below the knee-->Med rx.  . Pneumonia 07/2014   [facility] hospitalization  . Pneumonia due to COVID-19 virus 06/02/2019  . Pre-diabetes   . PSA, INCREASED 07/09/2008  . Spinal stenosis of lumbar region    Past Surgical History:  Procedure Laterality Date  . ABDOMINAL AORTOGRAM W/LOWER EXTREMITY [name] 09/12/2018   Procedure: ABDOMINAL AORTOGRAM W/LOWER EXTREMITY;  Surgeon: [name], MD;  Location: [facility];  Service: Cardiovascular;  Laterality: [name];  . APPENDECTOMY     rupture  . [place] and then another one [facility]  . CARDIAC CATHETERIZATION [name] 07/07/2015   Procedure: Left Heart Cath and Cors/Grafts Angiography;  Surgeon: [name], MD;  Location: [facility];  Service: Cardiovascular;  Laterality: [name];  . CATARACT EXTRACTION, BILATERAL    . CERVICAL SPINE SURGERY  12/2016   cervical stenosis [name])  . COLONOSCOPY  03/2010   HP polyp, diverticulosis, rpt 10 yrs ([name])  . CORONARY ARTERY BYPASS GRAFT  1990   3 vessel   . CYSTOSCOPY  12/23/10   [facility]  .  EYE SURGERY     cataract  . JOINT REPLACEMENT    . KNEE ARTHROSCOPY Right    x2  . LAMINOTOMY  1986   L5/S1 lumbar laminotomy for two ruptured discs/fusion  . LUMBAR LAMINECTOMY/DECOMPRESSION MICRODISCECTOMY [name] 07/13/2016   Procedure: LUMBAR TWO-THREE, LUMBAR THREE-FOUR, LUMBAR FOUR-FIVE LAMINECTOMY AND FORAMINOTOMY;  Surgeon: [name], MD;  Location: [facility];  Service: Neurosurgery;  Laterality: [name];  LAMINECTOMY AND FORAMINOTOMY L2-L3, L3-L4,L4-L5  . LUMBAR SPINE SURGERY  06/2020   Bilateral redo laminectomy/laminotomy/foraminotomies/medial facetectomy to decompress the bilateral L4 and  L5 nerve roots [name])  . TOTAL HIP ARTHROPLASTY Bilateral 1999,2000  . TOTAL KNEE ARTHROPLASTY Right 03/06/2017   Procedure: RIGHT TOTAL KNEE ARTHROPLASTY;  Surgeon: Gaynelle Arabian, MD;  Location: [facility];  Service: Orthopedics;  Laterality: Right;    Allergies  Allergies  Allergen Reactions  . Vioxx [Rofecoxib] Other (See Comments)    Hemorrhage   . Spiriva Respimat [Tiotropium Bromide Monohydrate] Other (See Comments)    Elevated bp  . Contrast Media [Iodinated Diagnostic Agents] Itching and Rash    Delayed reaction post abdominal aortagram.   . Morphine Nausea Only and Other (See Comments)    Irritability     History of Present Illness    80 year old male with above complex past medical history including CAD status post MI and CABG x2 in 1990.  This was followed by bare-metal stenting to the left circumflex in 2002.  Subsequent catheterization November 2016, showed 2 of 2 patent grafts with patent circumflex stent.  He has known small vessel and diffuse right coronary artery disease which has been medically managed with long-acting nitrate therapy.  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  In January 2020, he underwent lower extremity arterial angiography in the setting of ongoing lower extremity pain and abnormal duplex.  This showed diffuse and calcified bilateral SFA disease with three-vessel runoff below the knee bilaterally.  It was felt that symptoms were out of proportion to the degree of disease noted and medical therapy was recommended.  In October 2020 he was admitted with COVID 19.  Echocardiogram during hospitalization showed LV dysfunction with an EF of 35 to 40%.  Follow-up echo in early 2021 showed improvement in EF to 50-55%.  He was last seen October 2021 in preparation for back surgery.  Given low activity tolerance, stress testing was performed and  showed prior apical anterior and apical infarct without any significant ischemia.  He subsequently underwent back surgery in November.  Since his back surgery November, activity has been limited.  He has continued to have chronic and stable dyspnea on exertion.  He does not typically experience chest discomfort.  Beginning 2 evenings ago, he started experiencing left lower chest/left upper abdominal 4/10 dull aching occurring about once every 5 to 30 minutes, lasting about 30 seconds, and resolving spontaneously.  There are no associated symptoms and no known provoking factors.  He actually did not have any symptoms yesterday but noted a return of symptoms earlier this morning prompting him to call to schedule an appointment.  Since scheduling appointment, he has had complete resolution of this discomfort.  Is not any worse with palpation or deep breathing.  It is also not in any way similar to prior angina which was more typical in nature (substernal chest heaviness with dyspnea).  He denies palpitations, PND, orthopnea, dizziness, syncope, edema, or early satiety.  Home Medications  Prior to Admission medications   Medication Sig Start Date End Date Taking? Authorizing Provider  albuterol (VENTOLIN HFA) 108 (90 Base) MCG/ACT inhaler Inhale 2 puffs into the lungs every 6 (six) hours as needed for wheezing or shortness of breath. 05/22/19  Yes Ria Bush, MD  aspirin EC 81 MG EC tablet Take 1 tablet (81 mg total) by mouth daily. 06/26/19  Yes Elgergawy, Silver Huguenin, MD  Cyanocobalamin (B-12) 1000 MCG SUBL Place 1 tablet under the tongue daily. 12/07/18  Yes Ria Bush, MD  cyclobenzaprine (FLEXERIL) 10 MG tablet Take 1 tablet (10 mg total) by mouth 3 (three) times daily as needed for muscle spasms. 07/07/20  Yes [name], MD  docusate sodium (COLACE) 100 MG capsule Take 1 capsule (100 mg total) by mouth 2 (two) times daily. 07/07/20  Yes [name], MD  ezetimibe (ZETIA) 10 MG  tablet Take 1 tablet (10 mg total) by mouth daily. 10/22/19  Yes Gollan, Kathlene November, MD  fluticasone (FLONASE) 50 MCG/ACT nasal spray Place into both nostrils as needed for allergies or rhinitis.   Yes [provider]  furosemide (LASIX) 20 MG tablet TAKE ONE TABLET BY MOUTH EVERY DAY AS NEEDED 03/04/20  Yes Gollan, Kathlene November, MD  gabapentin (NEURONTIN) 300 MG capsule TAKE 1 CAPSULE BY MOUTH THREE TIMES DAILY 04/17/20  Yes Ria Bush, MD  HYDROcodone-acetaminophen (NORCO/VICODIN) 5-325 MG tablet Take 1-2 tablets by mouth every 4 (four) hours as needed. for pain 07/13/20  Yes [provider]  hydrocortisone 2.5 % lotion Apply topically daily as needed.   Yes [provider]  isosorbide mononitrate (IMDUR) 30 MG 24 hr tablet TAKE 1 TABLET BY MOUTH AT BEDTIME 08/10/20  Yes Gollan, Kathlene November, MD  losartan (COZAAR) 25 MG tablet TAKE 1 TABLET BY MOUTH DAILY 04/17/20  Yes Gollan, Kathlene November, MD  metoprolol succinate (TOPROL-XL) 25 MG 24 hr tablet Take 12.5 mg by mouth daily.   Yes [provider]  nitroGLYCERIN (NITROSTAT) 0.4 MG SL tablet Place 1 tablet (0.4 mg total) under the tongue every 5 (five) minutes as needed for chest pain. 12/07/18  Yes Ria Bush, MD  omeprazole (PRILOSEC) 20 MG capsule Take 1 capsule (20 mg total) by mouth daily. 02/25/20  Yes Tyler Pita, MD  oxyCODONE-acetaminophen (PERCOCET/ROXICET) 5-325 MG tablet Take 1-2 tablets by mouth every 4 (four) hours as needed for moderate pain. 07/07/20  Yes [name], MD  polyethylene glycol (MIRALAX / GLYCOLAX) 17 g packet Take 17 g by mouth daily as needed for mild constipation.    Yes [provider]  ranolazine (RANEXA) 500 MG 12 hr tablet TAKE ONE TABLET TWICE DAILY 12/02/19  Yes Gollan, Kathlene November, MD  simvastatin (ZOCOR) 40 MG tablet Take 1 tablet (40 mg total) by mouth daily. 10/22/19  Yes [name], MD  tamsulosin (FLOMAX) 0.4 MG CAPS capsule Take 0.4 mg by mouth at  bedtime. 05/21/20  Yes [provider]  tolterodine (DETROL LA) 4 MG 24 hr capsule Take 4 mg by mouth daily.   Yes [provider]    Review of Systems    Left lower chest/left upper quadrant dull aching discomfort that has been fleeting since Tuesday.  Is chronic, stable dyspnea exertion.  He denies any substernal angina.  He has not had any palpitations, PND, orthopnea, dizziness, syncope, or early satiety.  He uses Lasix as needed for lower extremity swelling is currently without swelling.  All other systems reviewed and are otherwise negative except as  noted above.  Physical Exam    VS:  BP 102/60 (BP Location: Left Arm, Patient Position: Sitting, Cuff Size: Normal)   Pulse 81   Ht 6' (1.829 m)   Wt 218 lb 4 oz (99 kg)   SpO2 97%   BMI 29.60 kg/m  , BMI Body mass index is 29.6 kg/m. GEN: Well nourished, well developed, in no acute distress. HEENT: normal. Neck: Supple, no JVD, carotid bruits, or masses. Cardiac: RRR, no murmurs, rubs, or gallops. No clubbing, cyanosis, edema.  Radials 2+/PT 1+ and equal bilaterally.  Respiratory:  Respirations regular and unlabored, clear to auscultation bilaterally. GI: Soft, nontender, nondistended, BS + x 4. MS: no deformity or atrophy. Skin: warm and dry, no rash. Neuro:  Strength and sensation are intact. Psych: Normal affect.  Accessory Clinical Findings    ECG personally reviewed by me today -regular sinus rhythm, 81, left axis deviation, left anterior fascicular block, prior anterior infarct- no acute changes.  Lab Results  Component Value Date   WBC 5.7 07/16/2020   HGB 11.3 (L) 07/16/2020   HCT 33.6 (L) 07/16/2020   MCV 96.6 07/16/2020   PLT 282 07/16/2020   Lab Results  Component Value Date   CREATININE 1.50 (H) 07/16/2020   BUN 26 (H) 07/16/2020   NA 139 07/16/2020   K 4.5 07/16/2020   CL 100 07/16/2020   CO2 26 07/16/2020   Lab Results  Component Value Date   ALT 14 01/07/2020   AST 17 01/07/2020    ALKPHOS 36 (L) 01/07/2020   BILITOT 0.4 01/07/2020   Lab Results  Component Value Date   CHOL 109 06/04/2019   HDL 33 (L) 06/04/2019   LDLCALC 62 06/04/2019   LDLDIRECT 106.0 01/01/2016   TRIG 78 06/18/2019   CHOLHDL 3.3 06/04/2019    Lab Results  Component Value Date   HGBA1C 6.1 01/07/2020    Assessment & Plan    1.  Atypical chest pain/coronary artery disease: Patient called our office today with a 2-day history of intermittent left lower chest/left upper quadrant 4/10 dull, fleeting discomfort occurring about once every 5 to 30 minutes, lasting about 30 seconds or less, and resolving spontaneously.  There are no associated symptoms.  This is different than prior angina which was more typical in nature (substernal chest heaviness with dyspnea).  ECG is unchanged and vital signs are stable.  Given atypical nature of symptoms and reassuring ECG and recent low risk stress test in October 2021, we agreed to not pursue any additional ischemic evaluation at this time.  Reassurance offered.  He is to remain on aspirin, beta-blocker, nitrate, Ranexa, Zetia, and statin therapy.  2.  Essential hypertension: Stable on beta-blocker, ARB, and nitrate.  3.  Hyperlipidemia: LDL was 62 in October 2020.  He remains on statin therapy.  He will have follow-up lipids at his annual visit with primary care.  4.  Peripheral arterial disease/lower extremity pain: Status post angiography in January 2020 showing moderate to severely calcified bilateral SFA disease with three-vessel runoff below the knees.  No targets for intervention were noted and symptoms were felt to be out of proportion to the degree of disease present.  He remains on aspirin and statin therapy.  He will follow up with Dr. [name] later this year.  5.  Chronic heart failure with improved ejection fraction: EF 50-55% by echo in 2021.  He uses Lasix on a as needed basis and is euvolemic on exam today.  Heart rate and  blood pressure stable on  current regimen.  6.  Disposition: Follow-up as planned with Dr. Rockey Situ in March.  He will follow up in [facility] clinic with Dr. [name] in late spring.  Murray Hodgkins, NP 10/01/2020, 4:13 PM

## 2020-10-15 DIAGNOSIS — Z96651 Presence of right artificial knee joint: Secondary | ICD-10-CM | POA: Diagnosis not present

## 2020-10-25 NOTE — Progress Notes (Unsigned)
Cardiology Office Note  Date:  10/26/2020   ID:  [name], DOB 11/05/1940, MRN [id number]  PCP:  Ria Bush, MD   Chief Complaint  Patient presents with  . Follow-up    6 month F/U    HPI:  80 year old gentleman with hx of OSA, did not tolerate CPAP mild  bilateral carotid arterial disease  <39% bilaterally 08/2016 hyperlipidemia  coronary artery disease,  bypass in 1990,  stenting to the circumflex in 2002,  Last cath 06/2015, patent grafts x 2, RCA small vessel diffuse disease DVT in the left lower extremity,  Completed back surgery for spinal stenosis, 06/20/2016 Cervical neck surgery 01/2017, [name]  total knee surgery in 02/2017 covid 05/20/2019 EF was 35 to 40% in 06/2019 Repeat echo February 2021  ejection fraction 50 to 55% who presents for routine followup Of his coronary artery disease, post  covid  Recently seen by pulmonary sept, 2021  postinflammatory pulmonary issues post Covid COPD poorly compensated Changes to  his inhalers made  Today, feels better Breathing stable Need knee surgery, "Piece broke off", pictures reviewed with him  Lots of bruising on asa Hands cold,  Hands swell at night  BP stable Back pain better  Sedentary, no exercise  Weight up 6 pounds past few months Eating more, sedentary No significant leg swelling  Labs reviewed HBA1C 6.1 CR 1.5, high end of range Rare lasix  EKG personally reviewed by myself on todays visit Shows normal sinus rhythm rate 77 bpm ,poor R wave progression,LAFB  Other records reviewed CT chest 06/2019 Diffuse multifocal areas of consolidation and ground-glass opacity throughout the lungs compatible with atypical infection in the setting of known COVID positivity.  Stress test 03/2019 Pharmacological myocardial perfusion imaging study with no significant ischemia Small region mild fixed perfusion defect in the distal anteroseptal and apical region, unable to exclude old  scar vs attenuation artifact EF estimated at 38%, septal wall hypokinesis, possibly secondary to CABG/post-operative state.  No EKG changes concerning for ischemia at peak stress or in recovery. Low risk scan  05/2019 COVID , and hospital 10 days treated with remdesivir and steroids, antibodies little bit of leg swelling but he attributed to not taking diuretics   Back in the hospital again for an additional 10-day stay October 28 he started to run another fever up to 101.3 has not had any chest pain or wheezing no headaches. blood pressure has been  running low in the 90s so he stopped using his lisinopril. His blood pressure after that improved to 130s over 60s  CT chest,  . Diffuse multifocal areas of consolidation and ground-glass opacity throughout the lungs compatible with atypical infection in the setting of known COVID positivity.  LE arterial 08/2018,   Right lower extremity: Moderately to severely calcified SFA with diffuse disease throughout its course especially in the midsegment and three-vessel runoff below the knee.  AAA less than 3 cm, on scan in 2018 reviewed with him Numerous previous episode of chest pain relieved with belching consistent with GI etiology.  Previous cardiac catheterization 07/07/2015  Ost LM to LM lesion, 50% stenosed.  Ost RCA lesion, 80% stenosed.  Prox RCA-1 lesion, 80% stenosed.  Prox RCA-2 lesion, 80% stenosed.  LIMA was injected is moderate in size, and is anatomically normal.  Dist LAD lesion, 60% stenosed.  Ost LAD to Prox LAD lesion, 100% stenosed.  Mid LAD lesion, 60% stenosed.  The left ventricular systolic function is normal.  SVG was injected is normal  in caliber, and is anatomically normal.  2nd Diag lesion, 50% stenosed.  severe RCA disease (small vessel) not amenable to PCI, moderate left main disease,  moderate LAD and proximal diagonal disease. 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  PSH:    Past Surgical History:  Procedure Laterality Date  . ABDOMINAL AORTOGRAM W/LOWER EXTREMITY [name] 09/12/2018   Procedure: ABDOMINAL AORTOGRAM W/LOWER EXTREMITY;  Surgeon: [name], MD;  Location: [facility];  Service: Cardiovascular;  Laterality: [name];  . APPENDECTOMY     rupture  . [name] and then another one [facility]  . CARDIAC CATHETERIZATION [name] 07/07/2015   Procedure: Left Heart Cath and Cors/Grafts Angiography;  Surgeon: [name], MD;  Location: [facility];  Service: Cardiovascular;  Laterality: [name];  . CATARACT EXTRACTION, BILATERAL    . CERVICAL SPINE SURGERY  12/2016   cervical stenosis [name])  . COLONOSCOPY  03/2010   HP polyp, diverticulosis, rpt 10 yrs ([name])  . CORONARY ARTERY BYPASS GRAFT  1990   3 vessel   .  CYSTOSCOPY  12/23/10   [facility]  . EYE SURGERY     cataract  . JOINT REPLACEMENT    . KNEE ARTHROSCOPY Right    x2  . LAMINOTOMY  1986   L5/S1 lumbar laminotomy for two ruptured discs/fusion  . LUMBAR LAMINECTOMY/DECOMPRESSION MICRODISCECTOMY [name] 07/13/2016   Procedure: LUMBAR TWO-THREE, LUMBAR THREE-FOUR, LUMBAR FOUR-FIVE LAMINECTOMY AND FORAMINOTOMY;  Surgeon: [name], MD;  Location: [facility];  Service: Neurosurgery;  Laterality: [name];  LAMINECTOMY AND FORAMINOTOMY L2-L3, L3-L4,L4-L5  . LUMBAR SPINE SURGERY  06/2020   Bilateral redo laminectomy/laminotomy/foraminotomies/medial facetectomy to decompress the bilateral L4 and L5 nerve roots [name])  . TOTAL HIP ARTHROPLASTY Bilateral 1999,2000  . TOTAL KNEE ARTHROPLASTY Right 03/06/2017   Procedure: RIGHT TOTAL KNEE ARTHROPLASTY;  Surgeon: Gaynelle Arabian, MD;  Location: [facility];  Service: Orthopedics;  Laterality: Right;    Current Outpatient Medications  Medication Sig Dispense Refill  . acetaminophen (TYLENOL) 500 MG tablet Take 500 mg by mouth every 8 (eight) hours as needed.    Marland Kitchen albuterol (VENTOLIN HFA) 108 (90 Base) MCG/ACT inhaler Inhale 2 puffs into the lungs every 6 (six) hours as needed for wheezing or shortness of breath. 18 g 3  . aspirin EC 81 MG EC tablet Take 1 tablet (81 mg total) by mouth daily. 30 tablet 0  . Cyanocobalamin (B-12) 1000 MCG SUBL Place 1 tablet under the tongue daily. 30 each   . cyclobenzaprine (FLEXERIL) 10 MG tablet Take 1 tablet (10 mg total) by mouth 3 (three) times daily as needed for muscle spasms. 30 tablet 0  . docusate sodium (COLACE) 100 MG capsule  Take 100 mg by mouth 2 (two) times daily as needed for mild constipation.    Marland Kitchen ezetimibe (ZETIA) 10 MG tablet Take 1 tablet (10 mg total) by mouth daily. 90 tablet 3  . fluticasone (FLONASE) 50 MCG/ACT nasal spray Place into both nostrils as needed for allergies or rhinitis.    . furosemide (LASIX) 20 MG tablet TAKE ONE TABLET BY MOUTH EVERY DAY AS  NEEDED 30 tablet 3  . gabapentin (NEURONTIN) 300 MG capsule TAKE 1 CAPSULE BY MOUTH THREE TIMES DAILY 270 capsule 1  . hydrocortisone 2.5 % lotion Apply topically daily as needed.    . isosorbide mononitrate (IMDUR) 30 MG 24 hr tablet TAKE 1 TABLET BY MOUTH AT BEDTIME 90 tablet 0  . losartan (COZAAR) 25 MG tablet TAKE 1 TABLET BY MOUTH DAILY 90 tablet 0  . metoprolol succinate (TOPROL-XL) 25 MG 24 hr tablet Take 12.5 mg by mouth daily.    . nitroGLYCERIN (NITROSTAT) 0.4 MG SL tablet Place 1 tablet (0.4 mg total) under the tongue every 5 (five) minutes as needed for chest pain. 25 tablet 3  . omeprazole (PRILOSEC) 20 MG capsule Take 1 capsule (20 mg total) by mouth daily. 30 capsule 4  . polyethylene glycol (MIRALAX / GLYCOLAX) 17 g packet Take 17 g by mouth daily as needed for mild constipation.     . ranolazine (RANEXA) 500 MG 12 hr tablet TAKE ONE TABLET TWICE DAILY 180 tablet 2  . simvastatin (ZOCOR) 40 MG tablet Take 1 tablet (40 mg total) by mouth daily. 90 tablet 3  . tamsulosin (FLOMAX) 0.4 MG CAPS capsule Take 0.4 mg by mouth at bedtime.    . tolterodine (DETROL LA) 4 MG 24 hr capsule Take 4 mg by mouth daily.     No current facility-administered medications for this visit.     Allergies:   Vioxx [rofecoxib], Spiriva respimat [tiotropium bromide monohydrate], Contrast media [iodinated diagnostic agents], and Morphine   Social History:  The patient  reports that he quit smoking about 32 years ago. His smoking use included cigarettes. He has a 25.00 pack-year smoking history. He has never used smokeless tobacco. He reports that he does not drink alcohol and does not use drugs.   Family History:   family history includes Cancer in his sister and another family member; Diabetes in his brother and mother; Heart attack in his mother; Heart disease in his father; Kidney disease in his father and sister; Stroke in his father and mother.   Review of Systems: Review of Systems   Constitutional: Negative.   HENT: Negative.   Respiratory: Negative.   Cardiovascular: Negative.   Gastrointestinal: Negative.   Musculoskeletal: Negative.   Neurological: Negative.   Psychiatric/Behavioral: Negative.   All other systems reviewed and are negative.  PHYSICAL EXAM: VS:  BP 110/64 (BP Location: Left Arm, Patient Position: Sitting, Cuff Size: Large)   Pulse 77   Ht 6' (1.829 m)   Wt 220 lb (99.8 kg)   SpO2 95%   BMI 29.84 kg/m  , BMI Body mass index is 29.84 kg/m. Constitutional:  oriented to person, place, and time. No distress.  HENT:  Head: Grossly normal Eyes:  no discharge. No scleral icterus.  Neck: No JVD, no carotid bruits  Cardiovascular: Regular rate and rhythm, no murmurs appreciated Pulmonary/Chest: Clear to auscultation bilaterally, no wheezes or rails Abdominal: Soft.  no distension.  no tenderness.  Musculoskeletal: Normal range of motion Neurological:  normal muscle tone. Coordination normal. No atrophy Skin:  Skin warm and dry Psychiatric: normal affect, pleasant   Recent Labs: 01/07/2020: ALT 14 07/16/2020: B Natriuretic Peptide 178.6; BUN 26; Creatinine, Ser 1.50; Hemoglobin 11.3; Platelets 282; Potassium 4.5; Sodium 139    Lipid Panel Lab Results  Component Value Date   CHOL 109 06/04/2019   HDL 33 (L) 06/04/2019   LDLCALC 62 06/04/2019   TRIG 78 06/18/2019      Wt Readings from Last 3 Encounters:  10/26/20 220 lb (99.8 kg)  10/01/20 218 lb 4 oz (99 kg)  08/26/20 214 lb 12.8 oz (97.4 kg)      ASSESSMENT AND PLAN: preop knee surgery acceptable risk , no further testing needed  Cardiomyopathy, post covid Recovered, EF up to 50 -55% by echo 09/2019 stable  Cad with stable angina Currently with no symptoms of angina. No further workup at this time. Continue current medication regimen.  Covid , 05/2019 Chronic shortness of breath recovered  COPD/emphysema Prior smoking history, Covid 2020 Followed by pulmonary On  inhalers  Mixed hyperlipidemia Cholesterol is at goal on the current lipid regimen. No changes to the medications were made.  Essential hypertension Blood pressure is well controlled on today's visit. No changes made to the medications.  Bilateral carotid artery stenosis Mild bilateral carotid disease less than 39% bilaterally Continue aggressive lipid management  Diet-controlled diabetes mellitus (HCC) Weight stable Higher numbers with prednisone  Ex-smoker Nonsmoker  PAD Follows with Dr. [name] ,  bilateral SFA disease Has appt coming up No significant pain    Total encounter time more than 25 minutes  Greater than 50% was spent in counseling and coordination of care with the patient   No orders of the defined types were placed in this encounter.    Signed, Esmond Plants, M.D., Ph.D. 10/26/2020  [place], [place]

## 2020-10-26 ENCOUNTER — Encounter: Payer: Self-pay | Admitting: Cardiovascular Disease

## 2020-10-26 ENCOUNTER — Ambulatory Visit: Payer: PPO | Admitting: Cardiovascular Disease

## 2020-10-26 ENCOUNTER — Other Ambulatory Visit: Payer: Self-pay

## 2020-10-26 VITALS — BP 110/64 | HR 77 | Ht 72.0 in | Wt 220.0 lb

## 2020-10-26 DIAGNOSIS — I2510 Atherosclerotic heart disease of native coronary artery without angina pectoris: Secondary | ICD-10-CM | POA: Diagnosis not present

## 2020-10-26 DIAGNOSIS — R079 Chest pain, unspecified: Secondary | ICD-10-CM | POA: Diagnosis not present

## 2020-10-26 DIAGNOSIS — E785 Hyperlipidemia, unspecified: Secondary | ICD-10-CM | POA: Diagnosis not present

## 2020-10-26 DIAGNOSIS — I5032 Chronic diastolic (congestive) heart failure: Secondary | ICD-10-CM

## 2020-10-26 DIAGNOSIS — I739 Peripheral vascular disease, unspecified: Secondary | ICD-10-CM | POA: Diagnosis not present

## 2020-10-26 DIAGNOSIS — R0789 Other chest pain: Secondary | ICD-10-CM | POA: Diagnosis not present

## 2020-10-26 DIAGNOSIS — I10 Essential (primary) hypertension: Secondary | ICD-10-CM

## 2020-10-26 NOTE — Patient Instructions (Signed)
Medication Instructions:  No changes  If you need a refill on your cardiac medications before your next appointment, please call your pharmacy.    Lab work: No new labs needed   If you have labs (blood work) drawn today and your tests are completely normal, you will receive your results only by: . MyChart Message (if you have MyChart) OR . A paper copy in the mail If you have any lab test that is abnormal or we need to change your treatment, we will call you to review the results.   Testing/Procedures: No new testing needed   Follow-Up: At [facility], you and your health needs are our priority.  As part of our continuing mission to provide you with exceptional heart care, we have created designated Provider Care Teams.  These Care Teams include your primary Cardiologist (physician) and Advanced Practice Providers (APPs -  Physician Assistants and Nurse Practitioners) who all work together to provide you with the care you need, when you need it.  . You will need a follow up appointment in 12 months  . Providers on your designated Care Team:   . Christopher Berge, NP . Ryan Dunn, PA-C . Jacquelyn Visser, PA-C  Any Other Special Instructions Will Be Listed Below (If Applicable).  COVID-19 Vaccine Information can be found at: [email or web address] For questions related to vaccine distribution or appointments, please email [email or web address] or call [phone].     

## 2020-10-27 ENCOUNTER — Ambulatory Visit (INDEPENDENT_AMBULATORY_CARE_PROVIDER_SITE_OTHER): Payer: PPO

## 2020-10-27 DIAGNOSIS — E782 Mixed hyperlipidemia: Secondary | ICD-10-CM

## 2020-10-27 DIAGNOSIS — I10 Essential (primary) hypertension: Secondary | ICD-10-CM | POA: Diagnosis not present

## 2020-10-27 NOTE — Progress Notes (Signed)
Encounter details: CCM Time Spent       Value Time User   Time spent with patient (minutes)  30 10/27/2020  9:42 AM Debbora Dus, RPH   Time spent performing Chart review  30 10/27/2020  9:42 AM Debbora Dus, [facility]   Total time (minutes)  60 10/27/2020  9:42 AM Debbora Dus, RPH      Moderate to High Complex Decision Making       Value Time User   Moderate to High complex decision making  No 10/27/2020  9:42 AM Debbora Dus, [facility]      CCM Services: This encounter meets routine CCM services.  Prior to outreach and patient consent for Chronic Care Management, I referred this patient for services after reviewing the nominated patient list or from a personal encounter with the patient.  I have personally reviewed this encounter including the documentation in this note and have collaborated with the care management provider regarding care management and care coordination activities to include development and update of the comprehensive care plan. I am certifying that I agree with the content of this note and encounter as supervising physician.

## 2020-10-27 NOTE — Patient Instructions (Signed)
Dear [name],  Below is a summary of the goals we discussed during our follow up appointment on October 27, 2020. Please contact me anytime with questions or concerns.   Visit Information   Patient Care Plan: CCM Pharmacy Care Plan    Problem Identified: [facility] "PATIENT-SPECIFIC PROBLEM"     Long-Range Goal: Disease Management   Start Date: 10/27/2020  Priority: High  Note:   Current Barriers:  . None identified  Pharmacist Clinical Goal(s):  Marland Kitchen Patient will contact provider office for questions/concerns as evidenced notation of same in electronic health record through collaboration with PharmD and provider.   Interventions: . 1:1 collaboration with Ria Bush, MD regarding development and update of comprehensive plan of care as evidenced by provider attestation and co-signature . Inter-disciplinary care team collaboration (see longitudinal plan of care) . Comprehensive medication review performed; medication list updated in electronic medical record  Hypertension (BP goal <140/90) -Controlled -Current treatment: . Metoprolol succinate 25 mg - 1 tablet daily . Losartan 25 mg - 1 tablet daily -Medications previously tried: none -Current home readings: checks occasionally - usually 130/70s -Denies hypotensive/hypertensive symptoms. Patient denies any chest pain, swelling or SOB. Using albuterol rare.  -Review BP goals and benefits of medications for prevention of heart attack, stroke and kidney damage; Importance of home blood pressure monitoring; Proper BP monitoring technique; -Counseled to monitor BP at home monthly, document, and provide log at future appointments -Recommended to continue current medication  Hyperlipidemia: (LDL goal <70 - CAD) -Controlled -Current treatment: . Simvastatin 40 mg - 1 tablet daily . Ezetimibe 10 mg - 1 tablet daily  -Medications previously tried: none  -Recommended to continue current medication  Pre-diabetes (A1c goal  <6.5%) -Controlled -Current medications: . No pharmacotherapy -Medications previously tried: none -Current home glucose readings - checks occasionally . fasting glucose: always below 130 . post prandial glucose: none -Denies hypoglycemic/hyperglycemic symptoms -Current exercise: unable due to recent back surgery -Educated on A1c and blood sugar goals; -Recommend: check feet daily and get yearly eye exams -Recommended continued occasional monitoring  Patient Goals/Self-Care Activities . Patient will:  - take medications as prescribed check glucose once monthly, document, and provide at future appointments check blood pressure once monthly, document, and provide at future appointments  Follow Up Plan: The care management team will reach out to the patient again over the next 90 days.       The patient verbalized understanding of instructions, educational materials, and care plan provided today and declined offer to receive copy of patient instructions, educational materials, and care plan.  Telephone follow up appointment with pharmacy team member scheduled for:  12 months   Debbora Dus, PharmD Clinical Pharmacist [facility] at [facility] [phone]

## 2020-10-27 NOTE — Progress Notes (Signed)
Chronic Care Management Pharmacy Note  10/27/2020 Name:  [name] MRN:  [id number] DOB:  08/11/1941  Subjective: [name] is an 80 y.o. year old male who is a primary patient of Ria Bush, MD.  The CCM team was consulted for assistance with disease management and care coordination needs.    Engaged with patient by telephone for follow up visit in response to provider referral for pharmacy case management and/or care coordination services.   Consent to Services:  The patient was given information about Chronic Care Management services, agreed to services, and gave verbal consent prior to initiation of services.  Please see initial visit note for detailed documentation.   Patient Care Team: Ria Bush, MD as PCP - General (Family Medicine) [name], MD as PCP - Cardiology (Cardiology) Debbora Dus, [facility] as Pharmacist (Pharmacist)  Last CCM 02/24/20  Recent office visits: 08/21/20 - PCP - Acute scrotum pain - Urinalysis today. We will refer you for scrotal ultrasound to further evaluation right scrotal pain. Continue current medicines. Return as needed or in 6 months for physical.   Recent consult visits: 10/26/20 - Cardiology - No med changes  10/01/20 - Cardiology - Atypical chest pain, No med changes 08/26/20 - Pulmonology - No med changes, follow up PRN 07/06/20 - Back surgery, still unable to pick up anything over 10 pounds  Hospital visits: ED - 07/16/20 - Right leg swelling, not admitted, sent home to follow up with PCP or return is symptoms worsen  Objective:  Lab Results  Component Value Date   CREATININE 1.50 (H) 07/16/2020   BUN 26 (H) 07/16/2020   GFR 54.74 (L) 01/07/2020   GFRNONAA 47 (L) 07/16/2020   GFRAA 57 (L) 12/18/2019   NA 139 07/16/2020   K 4.5 07/16/2020   CALCIUM 9.1 07/16/2020   CO2 26 07/16/2020    Lab Results  Component Value Date/Time   HGBA1C 6.1 01/07/2020 09:06 AM   HGBA1C 5.7 (A) 09/25/2019 12:06 PM    HGBA1C 7.4 (H) 06/19/2019 01:45 PM   GFR 54.74 (L) 01/07/2020 09:06 AM   GFR 62.18 12/04/2018 11:33 AM   MICROALBUR <0.7 12/04/2018 11:33 AM   MICROALBUR <0.7 11/30/2017 10:35 AM    Last diabetic Eye exam:  Lab Results  Component Value Date/Time   HMDIABEYEEXA No Retinopathy 09/04/2019 12:00 AM    Lab Results  Component Value Date   CHOL 109 06/04/2019   HDL 33 (L) 06/04/2019   LDLCALC 62 06/04/2019   LDLDIRECT 106.0 01/01/2016   TRIG 78 06/18/2019   CHOLHDL 3.3 06/04/2019    Hepatic Function Latest Ref Rng & Units 01/07/2020 09/10/2019 06/23/2019  Total Protein 6.0 - 8.3 g/dL 7.1 6.4 5.8(L)  Albumin 3.5 - 5.2 g/dL 4.6 4.3 2.6(L)  AST 0 - 37 U/L 17 16 39  ALT 0 - 53 U/L 14 15 71(H)  Alk Phosphatase 39 - 117 U/L 36(L) 35(L) 44  Total Bilirubin 0.2 - 1.2 mg/dL 0.4 0.3 0.5  Bilirubin, Direct 0.0 - 0.3 mg/dL - - -    Lab Results  Component Value Date/Time   TSH 4.06 06/05/2017 09:56 AM   TSH 2.37 01/24/2014 04:12 PM    CBC Latest Ref Rng & Units 07/16/2020 07/07/2020 07/02/2020  WBC 4.0 - 10.5 K/uL 5.7 8.9 5.7  Hemoglobin 13.0 - 17.0 g/dL 11.3(L) 11.8(L) 13.8  Hematocrit 39.0 - 52.0 % 33.6(L) 35.0(L) 42.1  Platelets 150 - 400 K/uL 282 186 191    No results found for:  VD25OH  Clinical ASCVD: Yes  The ASCVD Risk score Mikey Bussing DC Jr., et al., 2013) failed to calculate for the following reasons:   The valid total cholesterol range is 130 to 320 mg/dL    Depression screen [facility] 2/9 08/21/2020 12/09/2019 12/03/2018  Decreased Interest 0 0 0  Down, Depressed, Hopeless 0 0 0  PHQ - 2 Score 0 0 0  Altered sleeping - 0 0  Tired, decreased energy - 0 0  Change in appetite - 0 0  Feeling bad or failure about yourself  - 0 0  Trouble concentrating - 0 0  Moving slowly or fidgety/restless - 0 0  Suicidal thoughts - 0 0  PHQ-9 Score - 0 0  Difficult doing work/chores - Not difficult at all Not difficult at all  Some recent data might be hidden    Social History   Tobacco Use   Smoking Status Former Research scientist (life sciences)  . Packs/day: 1.00  . Years: 25.00  . Pack years: 25.00  . Types: Cigarettes  . Quit date: 08/15/1988  . Years since quitting: 32.2  Smokeless Tobacco Never Used   BP Readings from Last 3 Encounters:  10/26/20 110/64  10/01/20 102/60  08/26/20 124/80   Pulse Readings from Last 3 Encounters:  10/26/20 77  10/01/20 81  08/26/20 74   Wt Readings from Last 3 Encounters:  10/26/20 220 lb (99.8 kg)  10/01/20 218 lb 4 oz (99 kg)  08/26/20 214 lb 12.8 oz (97.4 kg)    Assessment/Interventions: Review of patient past medical history, allergies, medications, health status, including review of consultants reports, laboratory and other test data, was performed as part of comprehensive evaluation and provision of chronic care management services.   SDOH:  (Social Determinants of Health) assessments and interventions performed: Yes SDOH Interventions   Flowsheet Row Most Recent Value  SDOH Interventions   Financial Strain Interventions Intervention Not Indicated  [Medications affordable]      CCM Care Plan  Allergies  Allergen Reactions  . Vioxx [Rofecoxib] Other (See Comments)    Hemorrhage   . Spiriva Respimat [Tiotropium Bromide Monohydrate] Other (See Comments)    Elevated bp  . Contrast Media [Iodinated Diagnostic Agents] Itching and Rash    Delayed reaction post abdominal aortagram.   . Morphine Nausea Only and Other (See Comments)    Irritability     Medications Reviewed Today    Reviewed by Debbora Dus, [facility] (Pharmacist) on 10/27/20 at [place] List Status: <None>  Medication Order Taking? Sig Documenting Provider Last Dose Status Informant  acetaminophen (TYLENOL) 500 MG tablet [id number]  Take 500 mg by mouth every 8 (eight) hours as needed. [provider]  Active   albuterol (VENTOLIN HFA) 108 (90 Base) MCG/ACT inhaler [id number] Yes Inhale 2 puffs into the lungs every 6 (six) hours as needed for wheezing or shortness of  breath. Ria Bush, MD Taking Active Multiple Informants  aspirin EC 81 MG EC tablet [id number] Yes Take 1 tablet (81 mg total) by mouth daily. Elgergawy, Silver Huguenin, MD Taking Active Multiple Informants           Med Note Alfonzo Feller Oct 01, 2020  3:00 PM)    Cyanocobalamin (B-12) 1000 MCG SUBL [id number]  Place 1 tablet under the tongue daily. Ria Bush, MD  Active   cyclobenzaprine (FLEXERIL) 10 MG tablet [id number] Yes Take 1 tablet (10 mg total) by mouth 3 (three) times daily as needed for muscle spasms. [name], MD  Taking Active   docusate sodium (COLACE) 100 MG capsule [id number]  Take 100 mg by mouth 2 (two) times daily as needed for mild constipation. [provider]  Active   ezetimibe (ZETIA) 10 MG tablet [id number] Yes Take 1 tablet (10 mg total) by mouth daily. [name], MD Taking Active Multiple Informants  fluticasone (FLONASE) 50 MCG/ACT nasal spray [id number]  Place into both nostrils as needed for allergies or rhinitis. [provider]  Active   furosemide (LASIX) 20 MG tablet [id number] Yes TAKE ONE TABLET BY MOUTH EVERY DAY AS NEEDED Rockey Situ Kathlene November, MD Taking Active   gabapentin (NEURONTIN) 300 MG capsule [id number] Yes TAKE 1 CAPSULE BY MOUTH THREE TIMES DAILY Ria Bush, MD Taking Active   hydrocortisone 2.5 % lotion [id number]  Apply topically daily as needed. [provider]  Active   isosorbide mononitrate (IMDUR) 30 MG 24 hr tablet [id number] Yes TAKE 1 TABLET BY MOUTH AT BEDTIME Gollan, Kathlene November, MD Taking Active   losartan (COZAAR) 25 MG tablet [id number] Yes TAKE 1 TABLET BY MOUTH DAILY Gollan, Kathlene November, MD Taking Active   metoprolol succinate (TOPROL-XL) 25 MG 24 hr tablet [id number] Yes Take 12.5 mg by mouth daily. [provider] Taking Active   nitroGLYCERIN (NITROSTAT) 0.4 MG SL tablet [id number]  Place 1 tablet (0.4 mg total) under the tongue every 5 (five) minutes as needed for chest pain.  Ria Bush, MD  Active Multiple Informants           Med Note Tamala Julian, JEFFREY W   Wed Jun 19, 2019 12:24 AM)    omeprazole (PRILOSEC) 20 MG capsule [id number] No Take 1 capsule (20 mg total) by mouth daily.  Patient not taking: Reported on 10/27/2020   Tyler Pita, MD Not Taking Active Multiple Informants  polyethylene glycol (MIRALAX / GLYCOLAX) 17 g packet [id number]  Take 17 g by mouth daily as needed for mild constipation.  [provider]  Active Multiple Informants  ranolazine (RANEXA) 500 MG 12 hr tablet [id number] Yes TAKE ONE TABLET TWICE DAILY Gollan, Kathlene November, MD Taking Active   simvastatin (ZOCOR) 40 MG tablet [id number] Yes Take 1 tablet (40 mg total) by mouth daily. [name], MD Taking Active   tamsulosin [facility]) 0.4 MG CAPS capsule [id number] Yes Take 0.4 mg by mouth at bedtime. [provider] Taking Active Multiple Informants           Med Note Alfonzo Feller Oct 01, 2020  3:03 PM)    tolterodine (DETROL LA) 4 MG 24 hr capsule [id number] Yes Take 4 mg by mouth daily. [provider] Taking Active Multiple Informants          Patient Active Problem List   Diagnosis Date Noted  . Acute pain in scrotum 08/22/2020  . Spondylolisthesis of lumbar region 07/07/2020  . COPD (chronic obstructive pulmonary disease) ([place]) 01/14/2020  . Tick bite of left thigh 12/05/2019  . Coccydynia 09/20/2019  . 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 Anxiety 06/04/2019  . Hand swelling 12/07/2018  . Chronic right shoulder pain 09/10/2018  . Fall 03/08/2018  . Cervical stenosis of spinal canal 06/05/2017  . Pedal edema 06/05/2017  . Health maintenance examination 12/06/2016  . 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 Overweight (BMI 25.0-29.9) 07/04/2016  . Low vitamin B12 level 01/01/2016  . Medicare annual wellness visit, subsequent 07/03/2015  . Advanced care planning/counseling discussion 07/03/2015   . Prediabetes  05/04/2015  . Chest pain 05/04/2015  . Ex-smoker 05/04/2015  . 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 Spermatocele 04/02/2013  . Syncopal vertigo 11/04/2010  . Lumbar disc disease with radiculopathy 11/04/2010  . 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 PVD (peripheral vascular disease) with claudication ([place]) 08/11/2009  . 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 Carotid stenosis 07/09/2008  . 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 Essential hypertension 04/30/2007  . GERD 04/30/2007  . Hyperlipidemia 04/26/2007  . OA (osteoarthritis) of knee 04/26/2007  . DVT, HX OF 04/26/2007    Immunization History  Administered Date(s) Administered  . Fluad Quad(high Dose 65+) 04/17/2019  . Influenza Whole 08/15/2000, 07/04/2007, 05/09/2008, 05/31/2010  . Influenza, High Dose Seasonal PF 05/15/2013, 06/25/2015  . Influenza,inj,Quad PF,6+ Mos 04/17/2014, 05/18/2016, 06/07/2018  . Influenza-Unspecified 05/22/2017  . PFIZER(Purple Top)SARS-COV-2 Vaccination 09/07/2019, 09/28/2019  . Pneumococcal Conjugate-13 02/09/2015  . Pneumococcal Polysaccharide-23 08/15/2000, 10/29/2008  . Td 08/15/2005  . Tdap 02/10/2018  . Zoster 02/04/2009  . Zoster Recombinat (Shingrix) 04/05/2017, 07/25/2017    Conditions to be addressed/monitored:  Hypertension, Hyperlipidemia and Pre-diabetes  Care Plan : [place]  Updates made by Debbora Dus, [facility] since 10/27/2020 12:00 AM    Problem: CHL AMB "PATIENT-SPECIFIC PROBLEM"     Long-Range Goal: Disease Management   Start Date: 10/27/2020  Priority: High  Note:   Current Barriers:  . None identified  Pharmacist Clinical Goal(s):  Marland Kitchen Patient will contact provider office for questions/concerns as evidenced notation of same in electronic health record through collaboration with PharmD and provider.   Interventions: . 1:1 collaboration with Ria Bush,  MD regarding development and update of comprehensive plan of care as evidenced by provider attestation and co-signature . Inter-disciplinary care team collaboration (see longitudinal plan of care) . Comprehensive medication review performed; medication list updated in electronic medical record  Hypertension (BP goal <140/90) -Controlled -Current treatment: . Metoprolol succinate 25 mg - 1 tablet daily . Losartan 25 mg - 1 tablet daily -Medications previously tried: none -Current home readings: checks occasionally - usually 130/70s -Denies hypotensive/hypertensive symptoms. Patient denies any chest pain, swelling or SOB. Using albuterol rare.  -Review BP goals and benefits of medications for prevention of heart attack, stroke and kidney damage; Importance of home blood pressure monitoring; Proper BP monitoring technique; -Counseled to monitor BP at home monthly, document, and provide log at future appointments -Recommended to continue current medication  Hyperlipidemia: (LDL goal <70 - CAD) -Controlled -Current treatment: . Simvastatin 40 mg - 1 tablet daily . Ezetimibe 10 mg - 1 tablet daily  -Medications previously tried: none  -Recommended to continue current medication  Pre-diabetes (A1c goal <6.5%) -Controlled -Current medications: . No pharmacotherapy -Medications previously tried: none -Current home glucose readings - checks occasionally . fasting glucose: always below 130 . post prandial glucose: none -Denies hypoglycemic/hyperglycemic symptoms -Current exercise: unable due to recent back surgery -Educated on A1c and blood sugar goals; -Recommend: check feet daily and get yearly eye exams -Recommended continued occasional monitoring  Patient Goals/Self-Care Activities . Patient will:  - take medications as prescribed check glucose once monthly, document, and provide at future appointments check blood pressure once monthly, document, and provide at future  appointments  Follow Up Plan: The care management team will reach out to the patient again over the next 90 days.      Medication Assistance: None required.  Patient affirms current coverage meets needs.  Patient's preferred pharmacy is:  [facility] - [place], [place] - [address] [place] [address] Phone: [phone] Fax: [phone]  Uses pill box? Yes - fills 3 weeks at a time Pt endorses 100% compliance  Adherence (STAR) medications: Losartan - 07/10/20 90 DS (> 5 days past due) --> Pt denies missed doses. Will have CMA follow up in 2 months for adherence review. Simvastatin 40 mg - 10/21/20 90 DS (no gaps in adherence)  Care Plan and Follow Up Patient Decision:  Patient agrees to Care Plan and Follow-up.  Plan: Next PCP appointment scheduled for: July 2022; CCM Follow up in 1 year   Debbora Dus, PharmD Clinical Pharmacist [facility] [phone]

## 2020-11-02 ENCOUNTER — Other Ambulatory Visit: Payer: Self-pay | Admitting: Cardiovascular Disease

## 2020-11-05 ENCOUNTER — Ambulatory Visit: Payer: PPO | Admitting: Dermatology

## 2020-11-05 ENCOUNTER — Other Ambulatory Visit: Payer: Self-pay

## 2020-11-05 DIAGNOSIS — L821 Other seborrheic keratosis: Secondary | ICD-10-CM

## 2020-11-05 DIAGNOSIS — D692 Other nonthrombocytopenic purpura: Secondary | ICD-10-CM | POA: Diagnosis not present

## 2020-11-05 DIAGNOSIS — D1800 Hemangioma unspecified site: Secondary | ICD-10-CM | POA: Diagnosis not present

## 2020-11-05 DIAGNOSIS — D229 Melanocytic nevi, unspecified: Secondary | ICD-10-CM

## 2020-11-05 DIAGNOSIS — Z1283 Encounter for screening for malignant neoplasm of skin: Secondary | ICD-10-CM | POA: Diagnosis not present

## 2020-11-05 DIAGNOSIS — L814 Other melanin hyperpigmentation: Secondary | ICD-10-CM

## 2020-11-05 DIAGNOSIS — L578 Other skin changes due to chronic exposure to nonionizing radiation: Secondary | ICD-10-CM

## 2020-11-05 DIAGNOSIS — Z872 Personal history of diseases of the skin and subcutaneous tissue: Secondary | ICD-10-CM

## 2020-11-05 DIAGNOSIS — L570 Actinic keratosis: Secondary | ICD-10-CM | POA: Diagnosis not present

## 2020-11-05 DIAGNOSIS — Z96651 Presence of right artificial knee joint: Secondary | ICD-10-CM | POA: Diagnosis not present

## 2020-11-05 NOTE — Progress Notes (Signed)
   Follow-Up Visit   Subjective  [name] is a 80 y.o. male who presents for the following: UBSE (Hx AK's ).  The patient presents for Upper Body Skin Exam (UBSE) for skin cancer screening and mole check.  The following portions of the chart were reviewed this encounter and updated as appropriate:   Tobacco  Allergies  Meds  Problems  Med Hx  Surg Hx  Fam Hx     Review of Systems:  No other skin or systemic complaints except as noted in HPI or Assessment and Plan.  Objective  Well appearing patient in no apparent distress; mood and affect are within normal limits.  All skin waist up examined.  Objective  L ear x 2, R ear x 2 (4): Erythematous thin papules/macules with gritty scale.   Assessment & Plan  AK (actinic keratosis) (4) L ear x 2, R ear x 2  Destruction of lesion - L ear x 2, R ear x 2 Complexity: simple   Destruction method: cryotherapy   Informed consent: discussed and consent obtained   Timeout:  patient name, date of birth, surgical site, and procedure verified Lesion destroyed using liquid nitrogen: Yes   Region frozen until ice ball extended beyond lesion: Yes   Outcome: patient tolerated procedure well with no complications   Post-procedure details: wound care instructions given    Skin cancer screening   Lentigines - Scattered tan macules - Due to sun exposure - Benign-appering, observe - Recommend daily broad spectrum sunscreen SPF 30+ to sun-exposed areas, reapply every 2 hours as needed. - Call for any changes  Seborrheic Keratoses - Stuck-on, waxy, tan-brown papules and plaques  - Discussed benign etiology and prognosis. - Observe - Call for any changes  Melanocytic Nevi - Tan-brown and/or pink-flesh-colored symmetric macules and papules - Benign appearing on exam today - Observation - Call clinic for new or changing moles - Recommend daily use of broad spectrum spf 30+ sunscreen to sun-exposed areas.   Hemangiomas - Red  papules - Discussed benign nature - Observe - Call for any changes  Actinic Damage - Chronic, secondary to cumulative UV/sun exposure - diffuse scaly erythematous macules with underlying dyspigmentation - Recommend daily broad spectrum sunscreen SPF 30+ to sun-exposed areas, reapply every 2 hours as needed.  - Call for new or changing lesions.  Purpura - Chronic; persistent and recurrent.  Treatable, but not curable. - Violaceous macules and patches - Benign - Related to trauma, age, sun damage and/or use of blood thinners, chronic use of topical and/or oral steroids - Observe - Can use OTC arnica containing moisturizer such as Dermend Bruise Formula if desired - Call for worsening or other concerns  Skin cancer screening performed today.  Return in about 1 year (around 11/05/2021) for TBSE.  [name], CMA, am acting as scribe for Sarina Ser, MD .  Documentation: I have reviewed the above documentation for accuracy and completeness, and I agree with the above.  Sarina Ser, MD

## 2020-11-05 NOTE — Patient Instructions (Signed)

## 2020-11-10 ENCOUNTER — Encounter: Payer: Self-pay | Admitting: Dermatology

## 2020-11-10 DIAGNOSIS — M4316 Spondylolisthesis, lumbar region: Secondary | ICD-10-CM | POA: Diagnosis not present

## 2020-11-26 ENCOUNTER — Other Ambulatory Visit: Payer: Self-pay | Admitting: Cardiovascular Disease

## 2020-12-03 ENCOUNTER — Ambulatory Visit: Payer: PPO | Admitting: Cardiovascular Disease

## 2020-12-03 ENCOUNTER — Encounter: Payer: Self-pay | Admitting: Cardiovascular Disease

## 2020-12-03 ENCOUNTER — Other Ambulatory Visit: Payer: Self-pay

## 2020-12-03 VITALS — BP 122/62 | HR 66 | Ht 72.0 in | Wt 221.1 lb

## 2020-12-03 DIAGNOSIS — I10 Essential (primary) hypertension: Secondary | ICD-10-CM | POA: Diagnosis not present

## 2020-12-03 DIAGNOSIS — I739 Peripheral vascular disease, unspecified: Secondary | ICD-10-CM

## 2020-12-03 DIAGNOSIS — I2510 Atherosclerotic heart disease of native coronary artery without angina pectoris: Secondary | ICD-10-CM | POA: Diagnosis not present

## 2020-12-03 DIAGNOSIS — E785 Hyperlipidemia, unspecified: Secondary | ICD-10-CM

## 2020-12-03 NOTE — Patient Instructions (Signed)
Medication Instructions:  Your physician recommends that you continue on your current medications as directed. Please refer to the Current Medication list given to you today.  *If you need a refill on your cardiac medications before your next appointment, please call your pharmacy*   Lab Work: None ordered If you have labs (blood work) drawn today and your tests are completely normal, you will receive your results only by: . MyChart Message (if you have MyChart) OR . A paper copy in the mail If you have any lab test that is abnormal or we need to change your treatment, we will call you to review the results.   Testing/Procedures: None ordered   Follow-Up: At [facility], you and your health needs are our priority.  As part of our continuing mission to provide you with exceptional heart care, we have created designated Provider Care Teams.  These Care Teams include your primary Cardiologist (physician) and Advanced Practice Providers (APPs -  Physician Assistants and Nurse Practitioners) who all work together to provide you with the care you need, when you need it.  We recommend signing up for the patient portal called "MyChart".  Sign up information is provided on this After Visit Summary.  MyChart is used to connect with patients for Virtual Visits (Telemedicine).  Patients are able to view lab/test results, encounter notes, upcoming appointments, etc.  Non-urgent messages can be sent to your provider as well.   To learn more about what you can do with MyChart, go to [email or web address].    Your next appointment:   Your physician wants you to follow-up in: 1 year You will receive a reminder letter in the mail two months in advance. If you don't receive a letter, please call our office to schedule the follow-up appointment.   The format for your next appointment:   In Person  Provider:   You may see Muhammad Arida, MD or one of the following Advanced Practice Providers on your  designated Care Team:    Christopher Berge, NP  Ryan Dunn, PA-C  Jacquelyn Visser, PA-C  Cadence Furth, PA-C  Caitlin Walker, NP    Other Instructions [name]  

## 2020-12-03 NOTE — Progress Notes (Signed)
Cardiology Office Note   Date:  12/03/2020   ID:  [name], DOB Apr 27, 1941, MRN [id number]  PCP:  Ria Bush, MD  Cardiologist:  Dr. Rockey Situ   Chief Complaint  Patient presents with  . 6 month follow up     Patient c/o shortness of breath with little exertion and LE edema with more on the left. Medications reviewed by the patient verbally.       History of Present Illness: [name] is a 80 y.o. male who is here today for a follow-up visit regarding peripheral arterial disease. The patient has known history of coronary artery disease status post CABG in 1990, PCI of the left circumflex in 2002, DVT in the left lower extremity, hyperlipidemia and back surgery for spinal stenosis in 2017.  He had hip replacement x 2. 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  He was seen in early 2019 for peripheral arterial disease. Lower extremity arterial Doppler showed noncompressible arteries on the right side with normal toe brachial index and biphasic waveform.  Left ABI was normal with biphasic waveforms. Lower extremity arterial duplex showed evidence of bilateral SFA disease. His symptoms were felt to be multifactorial due to PAD, arthritis and severe peripheral neuropathy.  He had worsening bilateral leg pain in 2020.  Angiography in January of 2020 showed no significant aortoiliac disease.  On the right side, there was moderate to severely calcified SFA with diffuse disease especially in the midsegment and three-vessel runoff below the knee.  On the left, there was borderline significant SFA disease which was also calcified with three-vessel runoff below the knee.  I felt that his symptoms were out of proportion to his angiogram findings. The patient was hospitalized in October 2020 with COVID-19 pneumonia and had multiple complications.    He had back surgery done in November 2021 with significant  improvement in his back pain and leg pain.  He has no calf claudication at the present time.  He still seems to be more limited by shortness of breath.   Past Medical History:  Diagnosis Date  . 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 06/2019 Echo (in setting of COVID): EF 35-40%, mild LVH, g1 DD, glob HK; b. 09/2019 Echo: eF 50-55%, no rwma, Gr1 DD, nl RV size/fxn.  Marland Kitchen Actinic keratosis   . Anginal pain ([place])   . BENIGN PROSTATIC HYPERTROPHY, WITH URINARY OBSTRUCTION 09/06/2007  . CAD s/p CABG    a. 1990 s/p MI-->CABG x 2; b. 2002 s/p BMS to LCX; c. 06/2015 Cath: LM 50ost, LAD 100ost/p, 37m/d, D2 nl, LCX  patent stent, RCA 80p (small), LIMA->LAD nl, VG->D2 nl-->Med Rx; d. 05/2020 MV: fixed apical ant/apical defect. No signif ischemia.  . Carotid arterial disease ([employer])    a. 08/2016 Carotid U/S: <39% bilat.  . 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 Community acquired pneumonia of right lower lobe of lung 06/05/2017  . COVID-19 virus infection 05/30/2019   Covid PNA with hospitalization 05/2019  . 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 DVT, HX OF 1998  . Emphysema of lung ([place])   . GERD 04/30/2007  . Headache    hx migraines  . History of hiatal hernia   . History of shingles   . HYPERLIPIDEMIA 04/26/2007  . HYPERTENSION 04/30/2007  . INGUINAL HERNIA, RIGHT 05/26/2010  . Laceration of skin of left hand 03/08/2018  . Lumbar disc disease  with radiculopathy    neuropathy in feet  . Myocardial infarction ([place])    1989, 2002  . OSA (obstructive sleep apnea)    a. did not tolerate CPAP.  Marland Kitchen Osteoarthritis   . PAD (peripheral artery disease) ([place])    a. 07/2017 LE duplex: RSFA 75-27m, LSFA 75-19m, 50-74d; c. 08/2018 Periph Angio: No signif AoIliac dzs. Mod-sev Ca2+ RSFA w/ diff dzs throughout- 3 vessel runoff. Borderline signif LSFA dzs w/ mod-sev Ca2+ vessels and 3 vessel runoff below the knee-->Med rx.  . Pneumonia 07/2014   [facility] hospitalization  . Pneumonia due to  COVID-19 virus 06/02/2019  . Pre-diabetes   . PSA, INCREASED 07/09/2008  . Spinal stenosis of lumbar region     Past Surgical History:  Procedure Laterality Date  . ABDOMINAL AORTOGRAM W/LOWER EXTREMITY [name] 09/12/2018   Procedure: ABDOMINAL AORTOGRAM W/LOWER EXTREMITY;  Surgeon: [name], MD;  Location: [facility];  Service: Cardiovascular;  Laterality: [name];  . APPENDECTOMY     rupture  . [place] and then [facility] one [facility]  . CARDIAC CATHETERIZATION [name] 07/07/2015   Procedure: Left Heart Cath and Cors/Grafts Angiography;  Surgeon: [name], MD;  Location: [facility];  Service: Cardiovascular;  Laterality: [name];  . CATARACT EXTRACTION, BILATERAL    . CERVICAL SPINE SURGERY  12/2016   cervical stenosis [name])  . COLONOSCOPY  03/2010   HP polyp, diverticulosis, rpt 10 yrs ([name])  . CORONARY ARTERY BYPASS GRAFT  1990   3 vessel   . CYSTOSCOPY  12/23/10   [place]  . EYE SURGERY     cataract  . JOINT REPLACEMENT    . KNEE ARTHROSCOPY Right    x2  . LAMINOTOMY  1986   L5/S1 lumbar laminotomy for two ruptured discs/fusion  . LUMBAR LAMINECTOMY/DECOMPRESSION MICRODISCECTOMY [name] 07/13/2016   Procedure: LUMBAR TWO-THREE, LUMBAR THREE-FOUR, LUMBAR FOUR-FIVE LAMINECTOMY AND FORAMINOTOMY;  Surgeon: [name], MD;  Location: [facility];  Service: Neurosurgery;  Laterality: [name];  LAMINECTOMY AND FORAMINOTOMY L2-L3, L3-L4,L4-L5  . LUMBAR SPINE SURGERY  06/2020   Bilateral redo laminectomy/laminotomy/foraminotomies/medial facetectomy to decompress the bilateral L4 and L5 nerve roots [name])  . TOTAL HIP ARTHROPLASTY Bilateral 1999,2000  . TOTAL KNEE ARTHROPLASTY Right 03/06/2017   Procedure: RIGHT TOTAL KNEE ARTHROPLASTY;  Surgeon: Gaynelle Arabian, MD;  Location: [facility];  Service: Orthopedics;  Laterality: Right;     Current Outpatient Medications  Medication Sig Dispense Refill  . acetaminophen (TYLENOL) 500 MG tablet Take 500 mg by mouth  every 8 (eight) hours as needed.    Marland Kitchen albuterol (VENTOLIN HFA) 108 (90 Base) MCG/ACT inhaler Inhale 2 puffs into the lungs every 6 (six) hours as needed for wheezing or shortness of breath. 18 g 3  . aspirin EC 81 MG EC tablet Take 1 tablet (81 mg total) by mouth daily. 30 tablet 0  . Cyanocobalamin (B-12) 1000 MCG SUBL Place 1 tablet under the tongue daily. 30 each   . cyclobenzaprine (FLEXERIL) 10 MG tablet Take 1 tablet (10 mg total) by mouth 3 (three) times daily as needed for muscle spasms. 30 tablet 0  . docusate sodium (COLACE) 100 MG capsule Take 100 mg by mouth 2 (two) times daily as needed for mild constipation.    Marland Kitchen ezetimibe (ZETIA) 10 MG tablet Take 1 tablet (10 mg total) by mouth daily. 90 tablet 3  . fluticasone (FLONASE) 50 MCG/ACT nasal spray Place into both nostrils as needed for allergies  or rhinitis.    . furosemide (LASIX) 20 MG tablet TAKE ONE TABLET BY MOUTH EVERY DAY AS NEEDED 30 tablet 3  . gabapentin (NEURONTIN) 300 MG capsule TAKE 1 CAPSULE BY MOUTH THREE TIMES DAILY 270 capsule 1  . hydrocortisone 2.5 % lotion Apply topically daily as needed.    . isosorbide mononitrate (IMDUR) 30 MG 24 hr tablet TAKE 1 TABLET BY MOUTH AT BEDTIME 90 tablet 2  . losartan (COZAAR) 25 MG tablet TAKE 1 TABLET BY MOUTH DAILY 90 tablet 0  . metoprolol succinate (TOPROL-XL) 25 MG 24 hr tablet Take 12.5 mg by mouth daily.    . nitroGLYCERIN (NITROSTAT) 0.4 MG SL tablet Place 1 tablet (0.4 mg total) under the tongue every 5 (five) minutes as needed for chest pain. 25 tablet 3  . omeprazole (PRILOSEC) 20 MG capsule Take 1 capsule (20 mg total) by mouth daily. 30 capsule 4  . polyethylene glycol (MIRALAX / GLYCOLAX) 17 g packet Take 17 g by mouth daily as needed for mild constipation.     . ranolazine (RANEXA) 500 MG 12 hr tablet TAKE ONE TABLET TWICE DAILY 180 tablet 2  . simvastatin (ZOCOR) 40 MG tablet Take 1 tablet (40 mg total) by mouth daily. 90 tablet 3  . tamsulosin (FLOMAX) 0.4 MG CAPS  capsule Take 0.4 mg by mouth at bedtime.    . tolterodine (DETROL LA) 4 MG 24 hr capsule Take 4 mg by mouth daily.     No current facility-administered medications for this visit.    Allergies:   Vioxx [rofecoxib], Spiriva respimat [tiotropium bromide monohydrate], Contrast media [iodinated diagnostic agents], and Morphine    Social History:  The patient  reports that he quit smoking about 32 years ago. His smoking use included cigarettes. He has a 25.00 pack-year smoking history. He has never used smokeless tobacco. He reports that he does not drink alcohol and does not use drugs.   Family History:  The patient's family history includes Cancer in his sister and another family member; Diabetes in his brother and mother; Heart attack in his mother; Heart disease in his father; Kidney disease in his father and sister; Stroke in his father and mother.    ROS:  Please see the history of present illness.   Otherwise, review of systems are positive for none.   All other systems are reviewed and negative.    PHYSICAL EXAM: VS:  BP 122/62 (BP Location: Left Arm, Patient Position: Sitting, Cuff Size: Normal)   Pulse 66   Ht 6' (1.829 m)   Wt 221 lb 2 oz (100.3 kg)   SpO2 98%   BMI 29.99 kg/m  , BMI Body mass index is 29.99 kg/m. GEN: Well nourished, well developed, in no acute distress  HEENT: normal  Neck: no JVD, carotid bruits, or masses Cardiac: RRR; no  rubs, or gallops,no edema .  1/ 6 systolic ejection murmur in the aortic area Respiratory:  clear to auscultation bilaterally, normal work of breathing GI: soft, nontender, nondistended, + BS MS: no deformity or atrophy  Skin: warm and dry, no rash Neuro:  Strength and sensation are intact Psych: euthymic mood, full affect Vascular: Femoral pulses are normal.  Distal pulses are nonpalpable   EKG:  EKG is  ordered today. EKG showed normal sinus rhythm with left anterior fascicular block.  Recent Labs: 01/07/2020: ALT  14 07/16/2020: B Natriuretic Peptide 178.6; BUN 26; Creatinine, Ser 1.50; Hemoglobin 11.3; Platelets 282; Potassium 4.5; Sodium 139  Lipid Panel    Component Value Date/Time   CHOL 109 06/04/2019 0540   TRIG 78 06/18/2019 2150   HDL 33 (L) 06/04/2019 0540   CHOLHDL 3.3 06/04/2019 0540   VLDL 14 06/04/2019 0540   LDLCALC 62 06/04/2019 0540   LDLDIRECT 106.0 01/01/2016 0844      Wt Readings from Last 3 Encounters:  12/03/20 221 lb 2 oz (100.3 kg)  10/26/20 220 lb (99.8 kg)  10/01/20 218 lb 4 oz (99 kg)      No flowsheet data found.    ASSESSMENT AND PLAN:  1.  Peripheral arterial disease: The patient does have bilateral calcified SFA disease worse on the right side.  Currently with no calf claudication.  Most of his previous symptoms resolved after back surgery.  He continues to have symptoms of neuropathy.  In addition, he has pain related to arthritis and previous right knee replacement with subsequent fall.   Continue medical therapy for PAD.  2.  Coronary artery disease involving native coronary arteries without angina: Doing well.  Lexiscan Myoview in October of last year was low risk.  3.  Hyperlipidemia: He is tolerating simvastatin and Zetia.  4.  Essential hypertension: Blood pressure is controlled.      Disposition:   FU with me in 12 months for PAD.  Continue to follow-up with Dr. Rockey Situ as planned.  Signed, Kathlyn Sacramento, MD 12/03/20 [place], [place]

## 2020-12-23 ENCOUNTER — Telehealth: Payer: Self-pay

## 2020-12-23 NOTE — Chronic Care Management (AMB) (Addendum)
Chronic Care Management Pharmacy Assistant   Name: [name]  MRN: [id number] DOB: 11-07-1940  Reason for Encounter: Disease State- General Adherence   Recent office visits:  None since last CCM visit.  Recent consult visits:  12/03/20- Dr. Kathlyn Sacramento- Cardiology. EKG. Increased cyclobenzaprine from 5 mg TID to 10 mg TID PRN. Follow up 1 year 11/10/20 Dr. [name]- Neurosurgery. No additional data available 11/05/20- Dr. Sarina Ser- Dermatology. No changes. Follow up 1 year.   Hospital visits:  07/16/20- ED Visit. Right leg swelling. Not admitted  Medications: Outpatient Encounter Medications as of 12/23/2020  Medication Sig   acetaminophen (TYLENOL) 500 MG tablet Take 500 mg by mouth every 8 (eight) hours as needed.   albuterol (VENTOLIN HFA) 108 (90 Base) MCG/ACT inhaler Inhale 2 puffs into the lungs every 6 (six) hours as needed for wheezing or shortness of breath.   aspirin EC 81 MG EC tablet Take 1 tablet (81 mg total) by mouth daily.   Cyanocobalamin (B-12) 1000 MCG SUBL Place 1 tablet under the tongue daily.   cyclobenzaprine (FLEXERIL) 10 MG tablet Take 1 tablet (10 mg total) by mouth 3 (three) times daily as needed for muscle spasms.   docusate sodium (COLACE) 100 MG capsule Take 100 mg by mouth 2 (two) times daily as needed for mild constipation.   ezetimibe (ZETIA) 10 MG tablet Take 1 tablet (10 mg total) by mouth daily.   fluticasone (FLONASE) 50 MCG/ACT nasal spray Place into both nostrils as needed for allergies or rhinitis.   furosemide (LASIX) 20 MG tablet TAKE ONE TABLET BY MOUTH EVERY DAY AS NEEDED   gabapentin (NEURONTIN) 300 MG capsule TAKE 1 CAPSULE BY MOUTH THREE TIMES DAILY   hydrocortisone 2.5 % lotion Apply topically daily as needed.   isosorbide mononitrate (IMDUR) 30 MG 24 hr tablet TAKE 1 TABLET BY MOUTH AT BEDTIME   losartan (COZAAR) 25 MG tablet TAKE 1 TABLET BY MOUTH DAILY   metoprolol succinate (TOPROL-XL) 25 MG 24 hr tablet  Take 12.5 mg by mouth daily.   nitroGLYCERIN (NITROSTAT) 0.4 MG SL tablet Place 1 tablet (0.4 mg total) under the tongue every 5 (five) minutes as needed for chest pain.   omeprazole (PRILOSEC) 20 MG capsule Take 1 capsule (20 mg total) by mouth daily.   polyethylene glycol (MIRALAX / GLYCOLAX) 17 g packet Take 17 g by mouth daily as needed for mild constipation.    ranolazine (RANEXA) 500 MG 12 hr tablet TAKE ONE TABLET TWICE DAILY   simvastatin (ZOCOR) 40 MG tablet Take 1 tablet (40 mg total) by mouth daily.   tamsulosin (FLOMAX) 0.4 MG CAPS capsule Take 0.4 mg by mouth at bedtime.   tolterodine (DETROL LA) 4 MG 24 hr capsule Take 4 mg by mouth daily.   No facility-administered encounter medications on file as of 12/23/2020.     [place] for general disease state and medication adherence call.   Patient is not > 5 days past due for refill on the following medications per chart history:  [facility] Medications: Name   Last Fill Date   Days Supply Losartan 25 mg 11/03/20    90 Simvastatin 40 mg 10/21/20     90   What concerns do you have about your medications? No concerns at this time  How often do you forget or accidentally miss a dose? Rarely- if he forgets it is usually morning dose.  Do you use a pillbox? Yes  Are you having  any problems getting your medications from your pharmacy? No  Has the cost of your medications been a concern? No  Since last visit with CPP, no interventions have been made.   The patient  has not had an ED visit since last contact.   The patient denies problems with their health.   The patient denies side effects with his medications.   he denies  concerns or questions for Debbora Dus, Pharm. D at this time.   Counseled patient on:   Importance of taking medication daily without missed doses  Access to CCM team for any cost, medication, or pharmacy concerns  Patient states he is doing well. Denies any issues or concerns at this  time.   No appointments scheduled within the next 30 days.  Follow-Up:  Pharmacist Review  Debbora Dus, CPP notified  Margaretmary Dys, [place] Assistant [phone]  I have reviewed the care management and care coordination activities outlined in this encounter and I am certifying that I agree with the content of this note. No further action required.  Debbora Dus, PharmD Clinical Pharmacist [facility] at [facility] [phone]

## 2020-12-24 ENCOUNTER — Other Ambulatory Visit: Payer: Self-pay

## 2020-12-24 ENCOUNTER — Ambulatory Visit: Payer: PPO | Admitting: Dermatology

## 2020-12-24 DIAGNOSIS — L578 Other skin changes due to chronic exposure to nonionizing radiation: Secondary | ICD-10-CM | POA: Diagnosis not present

## 2020-12-24 DIAGNOSIS — L570 Actinic keratosis: Secondary | ICD-10-CM

## 2020-12-24 DIAGNOSIS — D1723 Benign lipomatous neoplasm of skin and subcutaneous tissue of right leg: Secondary | ICD-10-CM | POA: Diagnosis not present

## 2020-12-24 DIAGNOSIS — D485 Neoplasm of uncertain behavior of skin: Secondary | ICD-10-CM | POA: Diagnosis not present

## 2020-12-24 DIAGNOSIS — L820 Inflamed seborrheic keratosis: Secondary | ICD-10-CM | POA: Diagnosis not present

## 2020-12-24 NOTE — Progress Notes (Signed)
Follow-Up Visit   Subjective  [name] is a 80 y.o. male who presents for the following: Other (Spots of left ear and right thigh that get irritated).  The following portions of the chart were reviewed this encounter and updated as appropriate:   Tobacco  Allergies  Meds  Problems  Med Hx  Surg Hx  Fam Hx     Review of Systems:  No other skin or systemic complaints except as noted in HPI or Assessment and Plan.  Objective  Well appearing patient in no apparent distress; mood and affect are within normal limits.  A focused examination was performed including face, ears, right leg. Relevant physical exam findings are noted in the Assessment and Plan.  Objective  Left Mid Post Helix: Erythematous thin papules/macules with gritty scale.   Objective  Left post ear (2): Erythematous keratotic or waxy stuck-on papule or plaque.   Objective  Right Medial Thigh: 0.8 cm flesh colored papule   Assessment & Plan  AK (actinic keratosis) Left Mid Post Helix  RTC if persistent after 6-8 weeks  Destruction of lesion - Left Mid Post Helix Complexity: simple   Destruction method: cryotherapy   Informed consent: discussed and consent obtained   Timeout:  patient name, date of birth, surgical site, and procedure verified Lesion destroyed using liquid nitrogen: Yes   Region frozen until ice ball extended beyond lesion: Yes   Outcome: patient tolerated procedure well with no complications   Post-procedure details: wound care instructions given    Inflamed seborrheic keratosis (2) Left post ear  Destruction of lesion - Left post ear Complexity: simple   Destruction method: cryotherapy   Informed consent: discussed and consent obtained   Timeout:  patient name, date of birth, surgical site, and procedure verified Lesion destroyed using liquid nitrogen: Yes   Region frozen until ice ball extended beyond lesion: Yes   Outcome: patient tolerated procedure well with no  complications   Post-procedure details: wound care instructions given    Neoplasm of uncertain behavior of skin Right Medial Thigh  Epidermal / dermal shaving  Lesion diameter (cm):  0.8 Informed consent: discussed and consent obtained   Timeout: patient name, date of birth, surgical site, and procedure verified   Procedure prep:  Patient was prepped and draped in usual sterile fashion Prep type:  Isopropyl alcohol Anesthesia: the lesion was anesthetized in a standard fashion   Anesthetic:  1% lidocaine w/ epinephrine 1-100,000 buffered w/ 8.4% NaHCO3 Instrument used: flexible razor blade   Hemostasis achieved with: pressure, aluminum chloride and electrodesiccation   Outcome: patient tolerated procedure well   Post-procedure details: sterile dressing applied and wound care instructions given   Dressing type: bandage and petrolatum    Specimen 1 - Surgical pathology Differential Diagnosis: Irritated nevus R/O dysplasia Check Margins: No 0.8 cm flesh colored papule  Actinic Damage - chronic, secondary to cumulative UV radiation exposure/sun exposure over time - diffuse scaly erythematous macules with underlying dyspigmentation - Recommend daily broad spectrum sunscreen SPF 30+ to sun-exposed areas, reapply every 2 hours as needed.  - Recommend staying in the shade or wearing long sleeves, sun glasses (UVA+UVB protection) and wide brim hats (4-inch brim around the entire circumference of the hat). - Call for new or changing lesions.  Return for Follow up as scheduled.   I, Ashok Cordia, CMA, am acting as scribe for Sarina Ser, MD .  Documentation: I have reviewed the above documentation for accuracy and completeness, and I agree  with the above.  Sarina Ser, MD

## 2020-12-24 NOTE — Patient Instructions (Signed)
Wound Care Instructions  1. Cleanse wound gently with soap and water once a day then pat dry with clean gauze. Apply a thing coat of Petrolatum (petroleum jelly, "Vaseline") over the wound (unless you have an allergy to this). We recommend that you use a new, sterile tube of Vaseline. Do not pick or remove scabs. Do not remove the yellow or white "healing tissue" from the base of the wound.  2. Cover the wound with fresh, clean, nonstick gauze and secure with paper tape. You may use Band-Aids in place of gauze and tape if the would is small enough, but would recommend trimming much of the tape off as there is often too much. Sometimes Band-Aids can irritate the skin.  3. You should call the office for your biopsy report after 1 week if you have not already been contacted.  4. If you experience any problems, such as abnormal amounts of bleeding, swelling, significant bruising, significant pain, or evidence of infection, please call the office immediately.  5. FOR ADULT SURGERY PATIENTS: If you need something for pain relief you may take 1 extra strength Tylenol (acetaminophen) AND 2 Ibuprofen (200mg each) together every 4 hours as needed for pain. (do not take these if you are allergic to them or if you have a reason you should not take them.) Typically, you may only need pain medication for 1 to 3 days.      Cryotherapy Aftercare  . Wash gently with soap and water everyday.   . Apply Vaseline and Band-Aid daily until healed.   If you have any questions or concerns for your doctor, please call our main line at [phone] and press option 4 to reach your doctor's medical assistant. If no one answers, please leave a voicemail as directed and we will return your call as soon as possible. Messages left after 4 pm will be answered the following business day.   You may also send us a message via MyChart. We typically respond to MyChart messages within 1-2 business days.  For prescription refills,  please ask your pharmacy to contact our office. Our fax number is [phone].  If you have an urgent issue when the clinic is closed that cannot wait until the next business day, you can page your doctor at the number below.    Please note that while we do our best to be available for urgent issues outside of office hours, we are not available 24/7.   If you have an urgent issue and are unable to reach us, you may choose to seek medical care at your doctor's office, retail clinic, urgent care center, or emergency room.  If you have a medical emergency, please immediately call 911 or go to the emergency department.  Pager Numbers  - Dr. Kowalski: [phone]  - Dr. Moye: [phone]  - Dr. Stewart: [phone]  In the event of inclement weather, please call our main line at [phone] for an update on the status of any delays or closures.  Dermatology Medication Tips: Please keep the boxes that topical medications come in in order to help keep track of the instructions about where and how to use these. Pharmacies typically print the medication instructions only on the boxes and not directly on the medication tubes.   If your medication is too expensive, please contact our office at [phone] option 4 or send us a message through MyChart.   We are unable to tell what your co-pay for medications will be in advance as this   different depending on your insurance coverage. However, we may be able to find a substitute medication at lower cost or fill out paperwork to get insurance to cover a needed medication.   If a prior authorization is required to get your medication covered by your insurance company, please allow [place] 1-2 business days to complete this process.  Drug prices often vary depending on where the prescription is filled and some pharmacies may offer cheaper prices.  The website www.goodrx.com contains coupons for medications through different pharmacies. The prices  here do not account for what the cost may be with help from insurance (it may be cheaper with your insurance), but the website can give you the price if you did not use any insurance.  - You can print the associated coupon and take it with your prescription to the pharmacy.  - You may also stop by our office during regular business hours and pick up a GoodRx coupon card.  - If you need your prescription sent electronically to a different pharmacy, notify our office through [facility] or by phone at [phone] option 4.

## 2020-12-29 ENCOUNTER — Other Ambulatory Visit: Payer: Self-pay | Admitting: Pulmonary Disease

## 2020-12-29 ENCOUNTER — Telehealth: Payer: Self-pay

## 2020-12-29 ENCOUNTER — Encounter: Payer: Self-pay | Admitting: Dermatology

## 2020-12-29 NOTE — Telephone Encounter (Signed)
-----   Message from Ralene Bathe, MD sent at 12/29/2020  8:48 AM EDT ----- Diagnosis Skin , right medial thigh NEVUS LIPOMATOSUS SUPERFICIALIS  Benign "fatty mole" No further treatment needed

## 2020-12-29 NOTE — Telephone Encounter (Signed)
Patient informed of pathology results 

## 2021-01-20 ENCOUNTER — Other Ambulatory Visit: Payer: Self-pay | Admitting: Family Medicine

## 2021-01-20 ENCOUNTER — Other Ambulatory Visit: Payer: Self-pay | Admitting: Cardiovascular Disease

## 2021-01-20 NOTE — Telephone Encounter (Signed)
Gabapentin Last filled:  10/21/20, #270 Last OV:  08/21/20, acute scrotum pain Next OV:  02/23/21, AWV prt 2

## 2021-02-03 ENCOUNTER — Other Ambulatory Visit: Payer: Self-pay | Admitting: Cardiovascular Disease

## 2021-02-11 ENCOUNTER — Ambulatory Visit (INDEPENDENT_AMBULATORY_CARE_PROVIDER_SITE_OTHER): Payer: PPO

## 2021-02-11 DIAGNOSIS — Z0000 Encounter for general adult medical examination without abnormal findings: Secondary | ICD-10-CM

## 2021-02-11 NOTE — Patient Instructions (Signed)
[name] , Thank you for taking time to come for your Medicare Wellness Visit. I appreciate your ongoing commitment to your health goals. Please review the following plan we discussed and let me know if I can assist you in the future.   Screening recommendations/referrals: Colonoscopy: no longer required  Recommended yearly ophthalmology/optometry visit for glaucoma screening and checkup Recommended yearly dental visit for hygiene and checkup  Vaccinations: Influenza vaccine: Up to date, completed 05/18/2020, due 03/2021 Pneumococcal vaccine: Completed series Tdap vaccine: Up to date, completed 02/10/2018, due 01/2028 Shingles vaccine: Completed series   Covid-19: Completed vaccines, Bring card to physical so dates can be documented in chart  Advanced directives: copy in chart  Conditions/risks identified: hypertension, hyperlipidemia   Next appointment: Follow up in one year for your annual wellness visit.   Preventive Care 29 Years and Older, Male Preventive care refers to lifestyle choices and visits with your health care provider that can promote health and wellness. What does preventive care include? A yearly physical exam. This is also called an annual well check. Dental exams once or twice a year. Routine eye exams. Ask your health care provider how often you should have your eyes checked. Personal lifestyle choices, including: Daily care of your teeth and gums. Regular physical activity. Eating a healthy diet. Avoiding tobacco and drug use. Limiting alcohol use. Practicing safe sex. Taking low doses of aspirin every day. Taking vitamin and mineral supplements as recommended by your health care provider. What happens during an annual well check? The services and screenings done by your health care provider during your annual well check will depend on your age, overall health, lifestyle risk factors, and family history of disease. Counseling  Your health care provider  may ask you questions about your: Alcohol use. Tobacco use. Drug use. Emotional well-being. Home and relationship well-being. Sexual activity. Eating habits. History of falls. Memory and ability to understand (cognition). Work and work Statistician. Screening  You may have the following tests or measurements: Height, weight, and BMI. Blood pressure. Lipid and cholesterol levels. These may be checked every 5 years, or more frequently if you are over 36 years old. Skin check. Lung cancer screening. You may have this screening every year starting at age 62 if you have a 30-pack-year history of smoking and currently smoke or have quit within the past 15 years. Fecal occult blood test (FOBT) of the stool. You may have this test every year starting at age 72. Flexible sigmoidoscopy or colonoscopy. You may have a sigmoidoscopy every 5 years or a colonoscopy every 10 years starting at age 64. Prostate cancer screening. Recommendations will vary depending on your family history and other risks. Hepatitis C blood test. Hepatitis B blood test. Sexually transmitted disease (STD) testing. Diabetes screening. This is done by checking your blood sugar (glucose) after you have not eaten for a while (fasting). You may have this done every 1-3 years. Abdominal aortic aneurysm (AAA) screening. You may need this if you are a current or former smoker. Osteoporosis. You may be screened starting at age 67 if you are at high risk. Talk with your health care provider about your test results, treatment options, and if necessary, the need for more tests. Vaccines  Your health care provider may recommend certain vaccines, such as: Influenza vaccine. This is recommended every year. Tetanus, diphtheria, and acellular pertussis (Tdap, Td) vaccine. You may need a Td booster every 10 years. Zoster vaccine. You may need this after age 66. Pneumococcal 13-valent  conjugate (PCV13) vaccine. One dose is recommended after  age 28. Pneumococcal polysaccharide (PPSV23) vaccine. One dose is recommended after age 38. Talk to your health care provider about which screenings and vaccines you need and how often you need them. This information is not intended to replace advice given to you by your health care provider. Make sure you discuss any questions you have with your health care provider. Document Released: 08/28/2015 Document Revised: 04/20/2016 Document Reviewed: 06/02/2015 Elsevier Interactive Patient Education  2017 [place] Prevention in the Home Falls can cause injuries. They can happen to people of all ages. There are many things you can do to make your home safe and to help prevent falls. What can I do on the outside of my home? Regularly fix the edges of walkways and driveways and fix any cracks. Remove anything that might make you trip as you walk through a door, such as a raised step or threshold. Trim any bushes or trees on the path to your home. Use bright outdoor lighting. Clear any walking paths of anything that might make someone trip, such as rocks or tools. Regularly check to see if handrails are loose or broken. Make sure that both sides of any steps have handrails. Any raised decks and porches should have guardrails on the edges. Have any leaves, snow, or ice cleared regularly. Use sand or salt on walking paths during winter. Clean up any spills in your garage right away. This includes oil or grease spills. What can I do in the bathroom? Use night lights. Install grab bars by the toilet and in the tub and shower. Do not use towel bars as grab bars. Use non-skid mats or decals in the tub or shower. If you need to sit down in the shower, use a plastic, non-slip stool. Keep the floor dry. Clean up any water that spills on the floor as soon as it happens. Remove soap buildup in the tub or shower regularly. Attach bath mats securely with double-sided non-slip rug tape. Do not have  throw rugs and other things on the floor that can make you trip. What can I do in the bedroom? Use night lights. Make sure that you have a light by your bed that is easy to reach. Do not use any sheets or blankets that are too big for your bed. They should not hang down onto the floor. Have a firm chair that has side arms. You can use this for support while you get dressed. Do not have throw rugs and other things on the floor that can make you trip. What can I do in the kitchen? Clean up any spills right away. Avoid walking on wet floors. Keep items that you use a lot in easy-to-reach places. If you need to reach something above you, use a strong step stool that has a grab bar. Keep electrical cords out of the way. Do not use floor polish or wax that makes floors slippery. If you must use wax, use non-skid floor wax. Do not have throw rugs and other things on the floor that can make you trip. What can I do with my stairs? Do not leave any items on the stairs. Make sure that there are handrails on both sides of the stairs and use them. Fix handrails that are broken or loose. Make sure that handrails are as long as the stairways. Check any carpeting to make sure that it is firmly attached to the stairs. Fix any carpet that  is loose or worn. Avoid having throw rugs at the top or bottom of the stairs. If you do have throw rugs, attach them to the floor with carpet tape. Make sure that you have a light switch at the top of the stairs and the bottom of the stairs. If you do not have them, ask someone to add them for you. What else can I do to help prevent falls? Wear shoes that: Do not have high heels. Have rubber bottoms. Are comfortable and fit you well. Are closed at the toe. Do not wear sandals. If you use a stepladder: Make sure that it is fully opened. Do not climb a closed stepladder. Make sure that both sides of the stepladder are locked into place. Ask someone to hold it for you, if  possible. Clearly mark and make sure that you can see: Any grab bars or handrails. First and last steps. Where the edge of each step is. Use tools that help you move around (mobility aids) if they are needed. These include: Canes. Walkers. Scooters. Crutches. Turn on the lights when you go into a dark area. Replace any light bulbs as soon as they burn out. Set up your furniture so you have a clear path. Avoid moving your furniture around. If any of your floors are uneven, fix them. If there are any pets around you, be aware of where they are. Review your medicines with your doctor. Some medicines can make you feel dizzy. This can increase your chance of falling. Ask your doctor what other things that you can do to help prevent falls. This information is not intended to replace advice given to you by your health care provider. Make sure you discuss any questions you have with your health care provider. Document Released: 05/28/2009 Document Revised: 01/07/2016 Document Reviewed: 09/05/2014 Elsevier Interactive Patient Education  2017 [employer].

## 2021-02-11 NOTE — Progress Notes (Signed)
Subjective:   [name] is a 80 y.o. male who presents for Medicare Annual/Subsequent preventive examination.  Review of Systems: [name]      I connected with the patient today by telephone and verified that I am speaking with the correct person using two identifiers. Location patient: home Location nurse: work Persons participating in the telephone visit: patient, nurse.   I discussed the limitations, risks, security and privacy concerns of performing an evaluation and management service by telephone and the availability of in person appointments. I also discussed with the patient that there may be a patient responsible charge related to this service. The patient expressed understanding and verbally consented to this telephonic visit.        Cardiac Risk Factors include: advanced age (>9men, >93 women);male gender;hypertension;Other (see comment), Risk factor comments: hyperlipidemia     Objective:    Today's Vitals   There is no height or weight on file to calculate BMI.  Advanced Directives 02/11/2021 07/02/2020 12/18/2019 12/09/2019 06/19/2019 06/18/2019 06/02/2019  Does Patient Have a Medical Advance Directive? Yes No Yes Yes No No Yes  Type of Paramedic of [place];Living will - [place];Living will [place];Living will Healthcare Power of [place] of [place];Living will  Does patient want to make changes to medical advance directive? - - No - Patient declined - - No - Patient declined No - Patient declined  Copy of [place] in Chart? Yes - validated most recent copy scanned in chart (See row information) - Yes - validated most recent copy scanned in chart (See row information) Yes - validated most recent copy scanned in chart (See row information) - No - copy requested, Physician notified No - copy requested  Would patient like information on  creating a medical advance directive? - No - Patient declined - - No - Patient declined - -    Current Medications (verified) Outpatient Encounter Medications as of 02/11/2021  Medication Sig   acetaminophen (TYLENOL) 500 MG tablet Take 500 mg by mouth every 8 (eight) hours as needed.   albuterol (VENTOLIN HFA) 108 (90 Base) MCG/ACT inhaler Inhale 2 puffs into the lungs every 6 (six) hours as needed for wheezing or shortness of breath.   aspirin EC 81 MG EC tablet Take 1 tablet (81 mg total) by mouth daily.   Cyanocobalamin (B-12) 1000 MCG SUBL Place 1 tablet under the tongue daily.   cyclobenzaprine (FLEXERIL) 10 MG tablet Take 1 tablet (10 mg total) by mouth 3 (three) times daily as needed for muscle spasms.   docusate sodium (COLACE) 100 MG capsule Take 100 mg by mouth 2 (two) times daily as needed for mild constipation.   ezetimibe (ZETIA) 10 MG tablet TAKE ONE TABLET BY MOUTH EVERY DAY   fluticasone (FLONASE) 50 MCG/ACT nasal spray Place into both nostrils as needed for allergies or rhinitis.   furosemide (LASIX) 20 MG tablet TAKE ONE TABLET BY MOUTH EVERY DAY AS NEEDED   gabapentin (NEURONTIN) 300 MG capsule TAKE 1 CAPSULE BY MOUTH THREE TIMES DAILY   hydrocortisone 2.5 % lotion Apply topically daily as needed.   isosorbide mononitrate (IMDUR) 30 MG 24 hr tablet TAKE 1 TABLET BY MOUTH AT BEDTIME   losartan (COZAAR) 25 MG tablet TAKE 1 TABLET BY MOUTH DAILY   metoprolol succinate (TOPROL-XL) 25 MG 24 hr tablet Take 12.5 mg by mouth daily.   nitroGLYCERIN (NITROSTAT) 0.4 MG SL tablet  Place 1 tablet (0.4 mg total) under the tongue every 5 (five) minutes as needed for chest pain.   omeprazole (PRILOSEC) 20 MG capsule TAKE 1 CAPSULE EVERY DAY   polyethylene glycol (MIRALAX / GLYCOLAX) 17 g packet Take 17 g by mouth daily as needed for mild constipation.    ranolazine (RANEXA) 500 MG 12 hr tablet TAKE ONE TABLET TWICE DAILY   simvastatin (ZOCOR) 40 MG tablet TAKE 1 TABLET BY MOUTH DAILY    tamsulosin (FLOMAX) 0.4 MG CAPS capsule Take 0.4 mg by mouth at bedtime.   tolterodine (DETROL LA) 4 MG 24 hr capsule Take 4 mg by mouth daily.   No facility-administered encounter medications on file as of 02/11/2021.    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 06/2019 Echo (in setting of COVID): EF 35-40%, mild LVH, g1 DD, glob HK; b. 09/2019 Echo: eF 50-55%, no rwma, Gr1 DD, nl RV size/fxn.   Actinic keratosis    Anginal pain ([place])    BENIGN PROSTATIC HYPERTROPHY, WITH URINARY OBSTRUCTION 09/06/2007   CAD s/p CABG    a. 1990 s/p MI-->CABG x 2; b. 2002 s/p BMS to LCX; c. 06/2015 Cath: LM 50ost, LAD 100ost/p, 65m/d, D2 nl, LCX  patent stent, RCA 80p (small), LIMA->LAD nl, VG->D2 nl-->Med Rx; d. 05/2020 MV: fixed apical ant/apical defect. No signif ischemia.   Carotid arterial disease ([place])    a. 08/2016 Carotid U/S: <39% bilat.   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 did not tolerate CPAP.   Osteoarthritis    PAD (peripheral artery disease) ([place])    a. 07/2017 LE duplex: RSFA 75-15m, LSFA 75-47m, 50-74d; c. 08/2018 Periph Angio: No signif AoIliac dzs. Mod-sev  Ca2+ RSFA w/ diff dzs throughout- 3 vessel runoff. Borderline signif LSFA dzs w/ mod-sev Ca2+ vessels and 3 vessel runoff below the knee-->Med rx.   Pneumonia 07/2014   [facility] hospitalization   Pneumonia due to COVID-19 virus 06/02/2019   Pre-diabetes    PSA, INCREASED 07/09/2008   Spinal stenosis of lumbar region    Past Surgical History:  Procedure Laterality Date   ABDOMINAL AORTOGRAM W/LOWER EXTREMITY [name] 09/12/2018   Procedure: ABDOMINAL AORTOGRAM W/LOWER EXTREMITY;  Surgeon: [name], MD;  Location: [place] [facility];  Service: Cardiovascular;  Laterality: [name];   APPENDECTOMY     rupture   BACK SURGERY     [facility]   CARDIAC CATHETERIZATION [name] 07/07/2015   Procedure: Left Heart Cath and Cors/Grafts Angiography;  Surgeon: [name], MD;  Location: [place];  Service: Cardiovascular;  Laterality: [name];  CATARACT EXTRACTION, BILATERAL     CERVICAL SPINE SURGERY  12/2016   cervical stenosis [name])   COLONOSCOPY  03/2010   HP polyp, diverticulosis, rpt 10 yrs ([name])   CORONARY ARTERY BYPASS GRAFT  1990   3 vessel    CYSTOSCOPY  12/23/10   [facility]   EYE SURGERY     cataract   JOINT REPLACEMENT     KNEE ARTHROSCOPY Right    x2   LAMINOTOMY  1986   L5/S1 lumbar laminotomy for two ruptured discs/fusion   LUMBAR LAMINECTOMY/DECOMPRESSION MICRODISCECTOMY [name] 07/13/2016   Procedure: LUMBAR TWO-THREE, LUMBAR THREE-FOUR, LUMBAR FOUR-FIVE LAMINECTOMY AND FORAMINOTOMY;  Surgeon: [name], MD;  Location: [facility];  Service: Neurosurgery;  Laterality: [name];  LAMINECTOMY AND FORAMINOTOMY L2-L3, L3-L4,L4-L5   LUMBAR SPINE SURGERY  06/2020   Bilateral redo laminectomy/laminotomy/foraminotomies/medial facetectomy to decompress the bilateral L4 and L5 nerve roots [name])   TOTAL HIP ARTHROPLASTY Bilateral [id number]   TOTAL KNEE ARTHROPLASTY Right 03/06/2017   Procedure: RIGHT TOTAL KNEE ARTHROPLASTY;  Surgeon: Gaynelle Arabian, MD;   Location[facility];  Service: Orthopedics;  Laterality: Right;   Family History  Problem Relation Age of Onset   Stroke Mother    Heart attack Mother    Diabetes Mother    Stroke Father    Heart disease Father    Kidney disease Father        PCKD   Cancer Sister        throat   Diabetes Brother    Kidney disease Sister        PCKD   Cancer Other        5/7 nephews with lung cancer   Social History   Socioeconomic History   Marital status: Married    Spouse name: Not on file   Number of children: Not on file   Years of education: Not on file   Highest education level: Not on file  Occupational History   Not on file  Tobacco Use   Smoking status: Former    Packs/day: 1.00    Years: 25.00    Pack years: 25.00    Types: Cigarettes    Quit date: 08/15/1988    Years since quitting: 32.5   Smokeless tobacco: Never  Vaping Use   Vaping Use: Never used  Substance and Sexual Activity   Alcohol use: No    Alcohol/week: 0.0 standard drinks   Drug use: No   Sexual activity: Yes  Other Topics Concern   Not on file  Social History Narrative   Married, wife [name], for 25 years in 2016. 1 daughter and 3 grandkids from first marriage.    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    Diet: good water, fruits/vegetables daily      OSA eval recommended while hospitalized with Covid 05/2019   Social Determinants of Health   Financial Resource Strain: Low Risk    Difficulty of Paying Living Expenses: Not hard at all  Food Insecurity: No Food Insecurity   Worried About Charity fundraiser in the Last Year: Never true   Ran Out of Food in the Last Year: Never true  Transportation Needs: No Transportation Needs   Lack of Transportation (Medical): No   Lack of Transportation (Non-Medical): No  Physical Activity: Inactive   Days of Exercise per  Week: 0 days  Minutes of Exercise per Session: 0 min  Stress: No Stress Concern Present   Feeling of Stress : Not at all  Social Connections: Not on file    Tobacco Counseling Counseling given: Not Answered   Clinical Intake:  Pre-visit preparation completed: Yes  Pain : 0-10 Pain Type: Chronic pain Pain Location: Leg (feet, legs and hands) Pain Orientation: Left, Right Pain Descriptors / Indicators: Burning Pain Onset: More than a month ago Pain Frequency: Constant     Nutritional Risks: None Diabetes: No  How often do you need to have someone help you when you read instructions, pamphlets, or other written materials from your doctor or pharmacy?: 1 - Never  Diabetic: No Nutrition Risk Assessment:  Has the patient had any N/V/D within the last 2 months?  No  Does the patient have any non-healing wounds?  No  Has the patient had any unintentional weight loss or weight gain?  No   Diabetes:  Is the patient diabetic?  No  If diabetic, was a CBG obtained today?   [name] Did the patient bring in their glucometer from home?   [name] How often do you monitor your CBG's? [name].   Financial Strains and Diabetes Management:  Are you having any financial strains with the device, your supplies or your medication?  [name] .  Does the patient want to be seen by Chronic Care Management for management of their diabetes?   [name] Would the patient like to be referred to a Nutritionist or for Diabetic Management?   [name]   Interpreter Needed?: No  Information entered by :: CJohnson, LPN   Activities of Daily Living In your present state of health, do you have any difficulty performing the following activities: 02/11/2021 07/02/2020  Hearing? N N  Vision? N N  Difficulty concentrating or making decisions? N N  Walking or climbing stairs? N Y  Dressing or bathing? N N  Doing errands, shopping? N N  Preparing Food and eating ? N -  Using the Toilet? N -  In the past six months, have you  accidently leaked urine? Y -  Comment takes medication -  Do you have problems with loss of bowel control? N -  Managing your Medications? N -  Managing your Finances? N -  Housekeeping or managing your Housekeeping? N -  Some recent data might be hidden    Patient Care Team: Ria Bush, MD as PCP - General (Family Medicine) Rockey Situ Kathlene November, MD as PCP - Cardiology (Cardiology) Debbora Dus, [facility] as Pharmacist (Pharmacist)  Indicate any recent Medical Services you may have received from other than [facility] providers in the past year (date may be approximate).     Assessment:   This is a routine wellness examination for [place].  Hearing/Vision screen Vision Screening - Comments:: Patient gets annual eye exams   Dietary issues and exercise activities discussed: Current Exercise Habits: The patient does not participate in regular exercise at present, Exercise limited by: None identified   Goals Addressed             This Visit's Progress    Patient Stated       02/11/2021, I will maintain and continue medications as prescribed.         Depression Screen PHQ 2/9 Scores 02/11/2021 08/21/2020 12/09/2019 12/03/2018 11/30/2017 10/17/2017 08/22/2017  PHQ - 2 Score 0 0 0 0 0 0 0  PHQ- 9 Score 0 - 0 0 0 - -    Fall Risk Fall  Risk  02/11/2021 08/21/2020 12/09/2019 12/03/2018 11/30/2017  Falls in the past year? 0 0 0 1 No  Number falls in past yr: 0 0 0 1 -  Comment - - - - -  Injury with Fall? 0 0 0 1 -  Comment - - - bruising only -  Risk for fall due to : Medication side effect - Medication side effect - -  Follow up Falls evaluation completed;Falls prevention discussed Falls evaluation completed Falls evaluation completed;Falls prevention discussed - -    FALL RISK PREVENTION PERTAINING TO THE HOME:  Any stairs in or around the home? Yes  If so, are there any without handrails? No  Home free of loose throw rugs in walkways, pet beds, electrical cords, etc? Yes  Adequate  lighting in your home to reduce risk of falls? Yes   ASSISTIVE DEVICES UTILIZED TO PREVENT FALLS:  Life alert? No  Use of a cane, walker or w/c? Yes  Grab bars in the bathroom? No  Shower chair or bench in shower? No  Elevated toilet seat or a handicapped toilet? No   TIMED UP AND GO:  Was the test performed?  [name] telephone visit .    Cognitive Function: MMSE - Mini Mental State Exam 02/11/2021 12/09/2019 12/03/2018 11/30/2017 11/29/2016  Orientation to time 5 5 5 5 5   Orientation to Place 5 5 5 5 5   Registration 3 3 3 3 3   Attention/ Calculation 5 5 0 0 0  Recall 3 3 3 3 3   Language- name 2 objects - - 0 0 0  Language- repeat 1 1 1 1 1   Language- follow 3 step command - - 0 3 3  Language- read & follow direction - - 0 0 0  Write a sentence - - 0 0 0  Copy design - - 0 0 0  Total score - - 17 20 20   Mini Cog  Mini-Cog screen was completed. Maximum score is 22. A value of 0 denotes this part of the MMSE was not completed or the patient failed this part of the Mini-Cog screening.       Immunizations Immunization History  Administered Date(s) Administered   Fluad Quad(high Dose 65+) 04/17/2019   Influenza Whole 08/15/2000, 07/04/2007, 05/09/2008, 05/31/2010   Influenza, High Dose Seasonal PF 05/15/2013, 06/25/2015   Influenza,inj,Quad PF,6+ Mos 04/17/2014, 05/18/2016, 06/07/2018   Influenza-Unspecified 05/22/2017, 05/28/2020   PFIZER(Purple Top)SARS-COV-2 Vaccination 09/07/2019, 09/28/2019   Pneumococcal Conjugate-13 02/09/2015   Pneumococcal Polysaccharide-23 08/15/2000, 10/29/2008   Td 08/15/2005   Tdap 02/10/2018   Zoster Recombinat (Shingrix) 04/05/2017, 07/25/2017   Zoster, Live 02/04/2009    TDAP status: Up to date  Flu Vaccine status: Up to date  Pneumococcal vaccine status: Up to date  Covid-19 vaccine status: Completed vaccines, will bring card to physical so dates can be documented in chart  Qualifies for Shingles Vaccine? Yes   Zostavax completed Yes    Shingrix Completed?: Yes  Screening Tests Health Maintenance  Topic Date Due   Hepatitis C Screening  Never done   COVID-19 Vaccine (3 - Pfizer risk series) 10/26/2019   INFLUENZA VACCINE  03/15/2021   TETANUS/TDAP  02/11/2028   PNA vac Low Risk Adult  Completed   Zoster Vaccines- Shingrix  Completed   HPV VACCINES  Aged Out    Health Maintenance  Health Maintenance Due  Topic Date Due   Hepatitis C Screening  Never done   COVID-19 Vaccine (3 - Pfizer risk series) 10/26/2019  Colorectal cancer screening: No longer required.   Lung Cancer Screening: (Low Dose CT Chest recommended if Age 68-80 years, 30 pack-year currently smoking OR have quit w/in 15years.) does not qualify.    Additional Screening:  Hepatitis C Screening: does qualify; Completed due  Vision Screening: Recommended annual ophthalmology exams for early detection of glaucoma and other disorders of the eye. Is the patient up to date with their annual eye exam?  Yes  Who is the provider or what is the name of the office in which the patient attends annual eye exams? [facility], Dr. George Ina If pt is not established with a provider, would they like to be referred to a provider to establish care? No .   Dental Screening: Recommended annual dental exams for proper oral hygiene  Community Resource Referral / Chronic Care Management: CRR required this visit?  No   CCM required this visit?  No      Plan:     I have personally reviewed and noted the following in the patient's chart:   Medical and social history Use of alcohol, tobacco or illicit drugs  Current medications and supplements including opioid prescriptions. Patient is not currently taking opioid prescriptions. Functional ability and status Nutritional status Physical activity Advanced directives List of other physicians Hospitalizations, surgeries, and ER visits in previous 12 months Vitals Screenings to include cognitive,  depression, and falls Referrals and appointments  In addition, I have reviewed and discussed with patient certain preventive protocols, quality metrics, and best practice recommendations. A written personalized care plan for preventive services as well as general preventive health recommendations were provided to patient.   Due to this being a telephonic visit, the after visit summary with patients personalized plan was offered to patient via office or my-chart. Patient preferred to pick up at office at next visit or via mychart.   Andrez Grime, LPN   12/30/9840

## 2021-02-11 NOTE — Progress Notes (Signed)
PCP notes:  Health Maintenance: No gaps noted   Abnormal Screenings: none   Patient concerns: Worsening left foot neuropathy   Nurse concerns: none   Next PCP appt: 02/23/2021 @ 9 am

## 2021-02-12 ENCOUNTER — Ambulatory Visit: Payer: PPO

## 2021-02-13 ENCOUNTER — Other Ambulatory Visit: Payer: Self-pay | Admitting: Family Medicine

## 2021-02-13 DIAGNOSIS — E538 Deficiency of other specified B group vitamins: Secondary | ICD-10-CM

## 2021-02-13 DIAGNOSIS — E782 Mixed hyperlipidemia: Secondary | ICD-10-CM

## 2021-02-13 DIAGNOSIS — D649 Anemia, unspecified: Secondary | ICD-10-CM

## 2021-02-13 DIAGNOSIS — R7303 Prediabetes: Secondary | ICD-10-CM

## 2021-02-13 DIAGNOSIS — R7989 Other specified abnormal findings of blood chemistry: Secondary | ICD-10-CM

## 2021-02-16 ENCOUNTER — Other Ambulatory Visit: Payer: Self-pay

## 2021-02-16 ENCOUNTER — Other Ambulatory Visit (INDEPENDENT_AMBULATORY_CARE_PROVIDER_SITE_OTHER): Payer: PPO

## 2021-02-16 DIAGNOSIS — D649 Anemia, unspecified: Secondary | ICD-10-CM

## 2021-02-16 DIAGNOSIS — R7303 Prediabetes: Secondary | ICD-10-CM

## 2021-02-16 DIAGNOSIS — E782 Mixed hyperlipidemia: Secondary | ICD-10-CM

## 2021-02-16 DIAGNOSIS — E538 Deficiency of other specified B group vitamins: Secondary | ICD-10-CM

## 2021-02-16 LAB — COMPREHENSIVE METABOLIC PANEL
ALT: 13 U/L (ref 0–53)
AST: 15 U/L (ref 0–37)
Albumin: 4.2 g/dL (ref 3.5–5.2)
Alkaline Phosphatase: 33 U/L — ABNORMAL LOW (ref 39–117)
BUN: 31 mg/dL — ABNORMAL HIGH (ref 6–23)
CO2: 25 mEq/L (ref 19–32)
Calcium: 8.9 mg/dL (ref 8.4–10.5)
Chloride: 107 mEq/L (ref 96–112)
Creatinine, Ser: 1.34 mg/dL (ref 0.40–1.50)
GFR: 50.25 mL/min — ABNORMAL LOW (ref >60.00)
Glucose, Bld: 121 mg/dL — ABNORMAL HIGH (ref 70–99)
Potassium: 4.8 mEq/L (ref 3.5–5.1)
Sodium: 140 mEq/L (ref 135–145)
Total Bilirubin: 0.5 mg/dL (ref 0.2–1.2)
Total Protein: 6.1 g/dL (ref 6.0–8.3)

## 2021-02-16 LAB — TSH: TSH: 2.77 u[IU]/mL (ref 0.35–5.50)

## 2021-02-16 LAB — CBC WITH DIFFERENTIAL/PLATELET
Basophils Absolute: 0.1 10*3/uL (ref 0.0–0.1)
Basophils Relative: 1.8 % (ref 0.0–3.0)
Eosinophils Absolute: 0.3 10*3/uL (ref 0.0–0.7)
Eosinophils Relative: 5.5 % — ABNORMAL HIGH (ref 0.0–5.0)
HCT: 40.5 % (ref 39.0–52.0)
Hemoglobin: 13.8 g/dL (ref 13.0–17.0)
Lymphocytes Relative: 36.3 % (ref 12.0–46.0)
Lymphs Abs: 1.7 10*3/uL (ref 0.7–4.0)
MCHC: 34.1 g/dL (ref 30.0–36.0)
MCV: 95.5 fl (ref 78.0–100.0)
Monocytes Absolute: 0.5 10*3/uL (ref 0.1–1.0)
Monocytes Relative: 9.9 % (ref 3.0–12.0)
Neutro Abs: 2.2 10*3/uL (ref 1.4–7.7)
Neutrophils Relative %: 46.5 % (ref 43.0–77.0)
Platelets: 156 10*3/uL (ref 150.0–400.0)
RBC: 4.24 Mil/uL (ref 4.22–5.81)
RDW: 13.2 % (ref 11.5–15.5)
WBC: 4.7 10*3/uL (ref 4.0–10.5)

## 2021-02-16 LAB — LIPID PANEL
Cholesterol: 114 mg/dL (ref 0–200)
HDL: 33.4 mg/dL — ABNORMAL LOW (ref >39.00)
LDL Cholesterol: 62 mg/dL (ref 0–99)
NonHDL: 80.25
Total CHOL/HDL Ratio: 3
Triglycerides: 92 mg/dL (ref 0.0–149.0)
VLDL: 18.4 mg/dL (ref 0.0–40.0)

## 2021-02-16 LAB — FERRITIN: Ferritin: 149.3 ng/mL (ref 22.0–322.0)

## 2021-02-16 LAB — HEMOGLOBIN A1C: Hgb A1c MFr Bld: 6.3 % (ref 4.6–6.5)

## 2021-02-16 LAB — VITAMIN B12: Vitamin B-12: 491 pg/mL (ref 211–911)

## 2021-02-18 ENCOUNTER — Other Ambulatory Visit: Payer: Self-pay | Admitting: Family Medicine

## 2021-02-18 NOTE — Telephone Encounter (Signed)
Do you want to send rx, 8.5 g DAW as suggested by pharmacy?  Next OV: 02/23/21, AWV prt 2.

## 2021-02-23 ENCOUNTER — Encounter: Payer: Self-pay | Admitting: Family Medicine

## 2021-02-23 ENCOUNTER — Ambulatory Visit (INDEPENDENT_AMBULATORY_CARE_PROVIDER_SITE_OTHER): Payer: PPO | Admitting: Family Medicine

## 2021-02-23 ENCOUNTER — Other Ambulatory Visit: Payer: Self-pay

## 2021-02-23 VITALS — BP 124/62 | HR 62 | Temp 97.5°F | Ht 72.0 in | Wt 220.0 lb

## 2021-02-23 DIAGNOSIS — R0600 Dyspnea, unspecified: Secondary | ICD-10-CM

## 2021-02-23 DIAGNOSIS — M48062 Spinal stenosis, lumbar region with neurogenic claudication: Secondary | ICD-10-CM | POA: Diagnosis not present

## 2021-02-23 DIAGNOSIS — G629 Polyneuropathy, unspecified: Secondary | ICD-10-CM | POA: Insufficient documentation

## 2021-02-23 DIAGNOSIS — N138 Other obstructive and reflux uropathy: Secondary | ICD-10-CM

## 2021-02-23 DIAGNOSIS — R0609 Other forms of dyspnea: Secondary | ICD-10-CM

## 2021-02-23 DIAGNOSIS — R7303 Prediabetes: Secondary | ICD-10-CM

## 2021-02-23 DIAGNOSIS — E782 Mixed hyperlipidemia: Secondary | ICD-10-CM

## 2021-02-23 DIAGNOSIS — E538 Deficiency of other specified B group vitamins: Secondary | ICD-10-CM | POA: Diagnosis not present

## 2021-02-23 DIAGNOSIS — M5116 Intervertebral disc disorders with radiculopathy, lumbar region: Secondary | ICD-10-CM | POA: Diagnosis not present

## 2021-02-23 DIAGNOSIS — U071 COVID-19: Secondary | ICD-10-CM | POA: Diagnosis not present

## 2021-02-23 DIAGNOSIS — I739 Peripheral vascular disease, unspecified: Secondary | ICD-10-CM

## 2021-02-23 DIAGNOSIS — N401 Enlarged prostate with lower urinary tract symptoms: Secondary | ICD-10-CM | POA: Diagnosis not present

## 2021-02-23 DIAGNOSIS — I6523 Occlusion and stenosis of bilateral carotid arteries: Secondary | ICD-10-CM | POA: Diagnosis not present

## 2021-02-23 DIAGNOSIS — N1831 Chronic kidney disease, stage 3a: Secondary | ICD-10-CM

## 2021-02-23 DIAGNOSIS — I10 Essential (primary) hypertension: Secondary | ICD-10-CM | POA: Diagnosis not present

## 2021-02-23 DIAGNOSIS — Z0000 Encounter for general adult medical examination without abnormal findings: Secondary | ICD-10-CM

## 2021-02-23 DIAGNOSIS — G6289 Other specified polyneuropathies: Secondary | ICD-10-CM

## 2021-02-23 DIAGNOSIS — K219 Gastro-esophageal reflux disease without esophagitis: Secondary | ICD-10-CM | POA: Diagnosis not present

## 2021-02-23 DIAGNOSIS — J432 Centrilobular emphysema: Secondary | ICD-10-CM

## 2021-02-23 DIAGNOSIS — I43 Cardiomyopathy in diseases classified elsewhere: Secondary | ICD-10-CM

## 2021-02-23 NOTE — Assessment & Plan Note (Signed)
Redo laminectomy by Dr [name] 06/2020 with improvement in lower back pain and leg pain.

## 2021-02-23 NOTE — Progress Notes (Signed)
Patient ID: [name], male    DOB: 1940-10-19, 80 y.o.   MRN: [id number]  This visit was conducted in person.  BP 124/62   Pulse 62   Temp (!) 97.5 F (36.4 C) (Temporal)   Ht 6' (1.829 m)   Wt 220 lb (99.8 kg)   SpO2 91%   BMI 29.84 kg/m    CC: CPE Subjective:   HPI: [name] is a 80 y.o. male presenting on 02/23/2021 for Annual Exam   Saw health advisor last week for medicare wellness visit. Note reviewed.   No results found.  Flowsheet Row Clinical Support from 02/11/2021 in [place] at [facility]  PHQ-2 Total Score 0       Fall Risk  02/11/2021 08/21/2020 12/09/2019 12/03/2018 11/30/2017  Falls in the past year? 0 0 0 1 No  Number falls in past yr: 0 0 0 1 -  Comment - - - - -  Injury with Fall? 0 0 0 1 -  Comment - - - bruising only -  Risk for fall due to : Medication side effect - Medication side effect - -  Follow up Falls evaluation completed;Falls prevention discussed Falls evaluation completed Falls evaluation completed;Falls prevention discussed - -    S/p bilateral redo laminectomy/laminotomy/foraminotomies/medial facetectomy (instrumentation and fusion) to decompress the bilateral L4 and L5 nerve roots [name]) 07/06/2020.  Known PAD (bilateral calcified SFA disease, R>L) followed by Dr [name].   Known CAD and COPD. Notes progressive exertional dyspnea, present since COVID 05/2019. 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 Continues PRN albuterol inhaler, not on controller medicine. No wheezing or cough but notes O2 fluctuating from 89-90% to high 90s. Checks weight daily.   Notes progressive neuropathy to feet and hands this is despite gabapentin 300mg  tid. Worse neuropathy to L foot. No redness or warmth. Swelling persists. Denies inciting trauma/injury or fall. This affects balance.   Preventative: Colon cancer screening - colonoscopy 03/2010 HP polyp, diverticulosis, rpt 10 yrs ([name]). iFOB normal 01/2020. Denies blood in  stool, declines colon screening this year.  Prostate cancer screening - sees urology yearly - due for f/u  Lung cancer screening - not eligible  Flu shot - yearly [place] 08/2019, 09/2019, 2 boosters Td 2007, Tdap 2019 Pneumovax23 2002, 2010, prevnar13 01/2015 Zostavax 2010.  shingrix - 2018 completed series Advanced directive: received and scanned 05/2019. HCPOA is wife [name] then [name]. Does not want prolonged life support if terminal condition.  Seat belt use discussed Sunscreen use discussed. No changing moles on skin. Sees derm Nehemiah Massed).  Ex smoker - quit 1990  Alcohol - none  Bowel - no constipation Bladder - some incontinence - managed on detrol LA. Takes lasix Q3 days   Married, wife [name], for 25 years in 2016. 1 daughter and 3 grandkids from first marriage. 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 Mows yard. Diet: good water, fruits/vegetables daily      Relevant past medical, surgical, family and social history reviewed and updated as indicated. Interim medical history since our last visit reviewed. Allergies and medications reviewed and updated. Outpatient Medications Prior to Visit  Medication Sig Dispense Refill   acetaminophen (TYLENOL) 500 MG tablet Take 500 mg by mouth every 8 (eight) hours as needed.     aspirin EC 81 MG EC tablet Take 1 tablet (81 mg total) by mouth daily. 30 tablet 0  Cyanocobalamin (B-12) 1000 MCG SUBL Place 1 tablet under the tongue daily. 30 each    cyclobenzaprine (FLEXERIL) 10 MG tablet Take 1 tablet (10 mg total) by mouth 3 (three) times daily as needed for muscle spasms. 30 tablet 0   docusate sodium (COLACE) 100 MG capsule Take 100 mg by mouth 2 (two) times daily as needed for mild constipation.     ezetimibe (ZETIA) 10 MG tablet TAKE ONE TABLET BY MOUTH EVERY DAY 90 tablet 2   fluticasone (FLONASE) 50 MCG/ACT nasal spray Place into both nostrils  as needed for allergies or rhinitis.     furosemide (LASIX) 20 MG tablet TAKE ONE TABLET BY MOUTH EVERY DAY AS NEEDED 30 tablet 3   gabapentin (NEURONTIN) 300 MG capsule TAKE 1 CAPSULE BY MOUTH THREE TIMES DAILY 270 capsule 1   hydrocortisone 2.5 % lotion Apply topically daily as needed.     isosorbide mononitrate (IMDUR) 30 MG 24 hr tablet TAKE 1 TABLET BY MOUTH AT BEDTIME 90 tablet 2   losartan (COZAAR) 25 MG tablet TAKE 1 TABLET BY MOUTH DAILY 90 tablet 2   metoprolol succinate (TOPROL-XL) 25 MG 24 hr tablet Take 12.5 mg by mouth daily.     nitroGLYCERIN (NITROSTAT) 0.4 MG SL tablet Place 1 tablet (0.4 mg total) under the tongue every 5 (five) minutes as needed for chest pain. 25 tablet 3   omeprazole (PRILOSEC) 20 MG capsule TAKE 1 CAPSULE EVERY DAY 30 capsule 4   polyethylene glycol (MIRALAX / GLYCOLAX) 17 g packet Take 17 g by mouth daily as needed for mild constipation.      PROAIR HFA 108 (90 Base) MCG/ACT inhaler TAKE 2 PUFFS INTO LUNGS EVERY 6 HOURS ASNEEDED FOR WHEEZING OR SHORTNESS OF BREATH 8.5 g 6   ranolazine (RANEXA) 500 MG 12 hr tablet TAKE ONE TABLET TWICE DAILY 180 tablet 2   simvastatin (ZOCOR) 40 MG tablet TAKE 1 TABLET BY MOUTH DAILY 90 tablet 2   tamsulosin (FLOMAX) 0.4 MG CAPS capsule Take 0.4 mg by mouth at bedtime.     tolterodine (DETROL LA) 4 MG 24 hr capsule Take 4 mg by mouth daily.     No facility-administered medications prior to visit.     Per HPI unless specifically indicated in ROS section below Review of Systems  Constitutional:  Negative for activity change, appetite change, chills, fatigue, fever and unexpected weight change.  HENT:  Negative for hearing loss.   Eyes:  Negative for visual disturbance.  Respiratory:  Positive for chest tightness and shortness of breath. Negative for cough and wheezing.   Cardiovascular:  Positive for leg swelling. Negative for chest pain and palpitations.  Gastrointestinal:  Negative for abdominal distention, abdominal  pain, blood in stool, constipation, diarrhea, nausea and vomiting.  Genitourinary:  Negative for difficulty urinating and hematuria.  Musculoskeletal:  Negative for arthralgias, myalgias and neck pain.       Hands swell at night  Skin:  Negative for rash.  Neurological:  Negative for dizziness, seizures, syncope and headaches.  Hematological:  Negative for adenopathy. Does not bruise/bleed easily.  Psychiatric/Behavioral:  Negative for dysphoric mood. The patient is not nervous/anxious.    Objective:  BP 124/62   Pulse 62   Temp (!) 97.5 F (36.4 C) (Temporal)   Ht 6' (1.829 m)   Wt 220 lb (99.8 kg)   SpO2 91%   BMI 29.84 kg/m   Wt Readings from Last 3 Encounters:  02/23/21 220 lb (99.8 kg)  12/03/20 221  lb 2 oz (100.3 kg)  10/26/20 220 lb (99.8 kg)      Physical Exam Vitals and nursing note reviewed.  Constitutional:      General: He is not in acute distress.    Appearance: Normal appearance. He is well-developed. He is not ill-appearing.  HENT:     Head: Normocephalic and atraumatic.     Right Ear: Hearing, tympanic membrane, ear canal and external ear normal.     Left Ear: Hearing, tympanic membrane, ear canal and external ear normal.  Eyes:     General: No scleral icterus.    Extraocular Movements: Extraocular movements intact.     Conjunctiva/sclera: Conjunctivae normal.     Pupils: Pupils are equal, round, and reactive to light.  Neck:     Thyroid: No thyroid mass or thyromegaly.  Cardiovascular:     Rate and Rhythm: Normal rate and regular rhythm.     Pulses: Normal pulses.          Radial pulses are 2+ on the right side and 2+ on the left side.     Heart sounds: Normal heart sounds. No murmur heard. Pulmonary:     Effort: Pulmonary effort is normal. No respiratory distress.     Breath sounds: Normal breath sounds. No wheezing, rhonchi or rales.  Abdominal:     General: Bowel sounds are increased. There is no distension.     Palpations: Abdomen is soft. There  is no mass.     Tenderness: There is no abdominal tenderness. There is no guarding or rebound. Negative signs include Murphy's sign.     Hernia: No hernia is present.  Musculoskeletal:        General: Normal range of motion.     Cervical back: Normal range of motion and neck supple.     Right lower leg: No edema.     Left lower leg: No edema.  Lymphadenopathy:     Cervical: No cervical adenopathy.  Skin:    General: Skin is warm and dry.     Findings: No rash.  Neurological:     General: No focal deficit present.     Mental Status: He is alert and oriented to person, place, and time.  Psychiatric:        Mood and Affect: Mood normal.        Behavior: Behavior normal.        Thought Content: Thought content normal.        Judgment: Judgment normal.      Results for orders placed or performed in visit on 02/16/21  Ferritin  Result Value Ref Range   Ferritin 149.3 22.0 - 322.0 ng/mL  CBC with Differential/Platelet  Result Value Ref Range   WBC 4.7 4.0 - 10.5 K/uL   RBC 4.24 4.22 - 5.81 Mil/uL   Hemoglobin 13.8 13.0 - 17.0 g/dL   HCT 40.5 39.0 - 52.0 %   MCV 95.5 78.0 - 100.0 fl   MCHC 34.1 30.0 - 36.0 g/dL   RDW 13.2 11.5 - 15.5 %   Platelets 156.0 150.0 - 400.0 K/uL   Neutrophils Relative % 46.5 43.0 - 77.0 %   Lymphocytes Relative 36.3 12.0 - 46.0 %   Monocytes Relative 9.9 3.0 - 12.0 %   Eosinophils Relative 5.5 (H) 0.0 - 5.0 %   Basophils Relative 1.8 0.0 - 3.0 %   Neutro Abs 2.2 1.4 - 7.7 K/uL   Lymphs Abs 1.7 0.7 - 4.0 K/uL   Monocytes Absolute 0.5  0.1 - 1.0 K/uL   Eosinophils Absolute 0.3 0.0 - 0.7 K/uL   Basophils Absolute 0.1 0.0 - 0.1 K/uL  Hemoglobin A1c  Result Value Ref Range   Hgb A1c MFr Bld 6.3 4.6 - 6.5 %  TSH  Result Value Ref Range   TSH 2.77 0.35 - 5.50 uIU/mL  Comprehensive metabolic panel  Result Value Ref Range   Sodium 140 135 - 145 mEq/L   Potassium 4.8 3.5 - 5.1 mEq/L   Chloride 107 96 - 112 mEq/L   CO2 25 19 - 32 mEq/L   Glucose, Bld  121 (H) 70 - 99 mg/dL   BUN 31 (H) 6 - 23 mg/dL   Creatinine, Ser 1.34 0.40 - 1.50 mg/dL   Total Bilirubin 0.5 0.2 - 1.2 mg/dL   Alkaline Phosphatase 33 (L) 39 - 117 U/L   AST 15 0 - 37 U/L   ALT 13 0 - 53 U/L   Total Protein 6.1 6.0 - 8.3 g/dL   Albumin 4.2 3.5 - 5.2 g/dL   GFR 50.25 (L) >60.00 mL/min   Calcium 8.9 8.4 - 10.5 mg/dL  Lipid panel  Result Value Ref Range   Cholesterol 114 0 - 200 mg/dL   Triglycerides 92.0 0.0 - 149.0 mg/dL   HDL 33.40 (L) >39.00 mg/dL   VLDL 18.4 0.0 - 40.0 mg/dL   LDL Cholesterol 62 0 - 99 mg/dL   Total CHOL/HDL Ratio 3    NonHDL 80.25   Vitamin B12  Result Value Ref Range   Vitamin B-12 491 211 - 911 pg/mL    Assessment & Plan:  This visit occurred during the SARS-CoV-2 public health emergency.  Safety protocols were in place, including screening questions prior to the visit, additional usage of staff PPE, and extensive cleaning of exam room while observing appropriate contact time as indicated for disinfecting solutions.   Problem List Items Addressed This Visit     Hyperlipidemia    Chronic, stable. Continue simvastatin and zetia.  The ASCVD Risk score Mikey Bussing DC Jr., et al., 2013) failed to calculate for the following reasons:   The valid total cholesterol range is 130 to 320 mg/dL        Essential hypertension    Chronic, stable. Continue current regimen of imdur 30mg  daily, losartan 25mg  daily, metoprolol 12.5mg  daily and lasix 20mg  PRN.        Carotid stenosis    Latest carotid US showing stenosis <39% (2018).  Consider updating. Continue cholesterol control.        PVD (peripheral vascular disease) with claudication (HCC)    No obvious claudication. Sees Dr [name].  Continue aspirin, statin.        GERD    Continues low dose PPI       Benign prostatic hyperplasia with urinary obstruction    Sees urology yearly - advised call to schedule f/u. Previously saw Dr Karsten Ro until his retirement. Currently on flomax and detrol  LA       Lumbar disc disease with radiculopathy    Redo laminectomy by Dr [name] 06/2020 with improvement in lower back pain and leg pain.        Prediabetes    Encouraged limiting added sugars in the diet.       Exertional dyspnea    Doubt COPD flare. Recommend f/u with cardiology for further evaluation.       Low vitamin B12 level    Continue b12 oral 1071mcg daily.  Lumbar stenosis with neurogenic claudication    S/p redo laminectomy [name])       Health maintenance examination - Primary    Preventative protocols reviewed and updated unless pt declined. Discussed healthy diet and lifestyle.        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       COPD (chronic obstructive pulmonary disease) (HCC)    Chronic, adequate off controller medication. Didn't find benefit with anoro elipta. New progressive exertional dyspnea - recommend cards eval and if reassuring, then possibly COPD related.        Peripheral neuropathy    Progressive neuropathy noted, worse at L foot affecting balance. This is despite gabapentin 300mg  TID. I did ask him to return in 2-3 months for further evaluation.        CKD (chronic kidney disease) stage 3, GFR 30-59 ml/min (HCC)    Reviewed with patient, encouraged limiting NSAIDs, increasing fluid intake.          No orders of the defined types were placed in this encounter.  No orders of the defined types were placed in this encounter.    Patient instructions: Send [place] dates of covid boosters  Call Dr Rockey Situ for follow up appointment for progressive shortness of breath. Avoid heat/humidity.  Good to see you today.  Return as needed or in 2-3 months to further discuss neuropathy.  Watch sugars. Increase water.   Follow up plan: Return in about 3 months (around 05/26/2021) for follow up visit.  Ria Bush, MD

## 2021-02-23 NOTE — Assessment & Plan Note (Addendum)
Chronic, adequate off controller medication. Didn't find benefit with anoro elipta. New progressive exertional dyspnea - recommend cards eval and if reassuring, then possibly COPD related.

## 2021-02-23 NOTE — Patient Instructions (Addendum)
Send [place] dates of covid boosters  Call Dr Rockey Situ for follow up appointment for progressive shortness of breath. Avoid heat/humidity.  Good to see you today.  Return as needed or in 2-3 months to further discuss neuropathy.  Watch sugars. Increase water.   Health Maintenance After Age 80 After age 71, you are at a higher risk for certain long-term diseases and infections as well as injuries from falls. Falls are a major cause of broken bones and head injuries in people who are older than age 39. Getting regular preventive care can help to keep you healthy and well. Preventive care includes getting regular testing and making lifestyle changes as recommended by your health care provider. Talk with your health care provider about: Which screenings and tests you should have. A screening is a test that checks for a disease when you have no symptoms. A diet and exercise plan that is right for you. What should I know about screenings and tests to prevent falls? Screening and testing are the best ways to find a health problem early. Early diagnosis and treatment give you the best chance of managing medical conditions that are common after age 44. Certain conditions and lifestyle choices may make you more likely to have a fall. Your health care provider may recommend: Regular vision checks. Poor vision and conditions such as cataracts can make you more likely to have a fall. If you wear glasses, make sure to get your prescription updated if your vision changes. Medicine review. Work with your health care provider to regularly review all of the medicines you are taking, including over-the-counter medicines. Ask your health care provider about any side effects that may make you more likely to have a fall. Tell your health care provider if any medicines that you take make you feel dizzy or sleepy. Osteoporosis screening. Osteoporosis is a condition that causes the bones to get weaker. This can make the bones weak  and cause them to break more easily. Blood pressure screening. Blood pressure changes and medicines to control blood pressure can make you feel dizzy. Strength and balance checks. Your health care provider may recommend certain tests to check your strength and balance while standing, walking, or changing positions. Foot health exam. Foot pain and numbness, as well as not wearing proper footwear, can make you more likely to have a fall. Depression screening. You may be more likely to have a fall if you have a fear of falling, feel emotionally low, or feel unable to do activities that you used to do. Alcohol use screening. Using too much alcohol can affect your balance and may make you more likely to have a fall. What actions can I take to lower my risk of falls? General instructions Talk with your health care provider about your risks for falling. Tell your health care provider if: You fall. Be sure to tell your health care provider about all falls, even ones that seem minor. You feel dizzy, sleepy, or off-balance. Take over-the-counter and prescription medicines only as told by your health care provider. These include any supplements. Eat a healthy diet and maintain a healthy weight. A healthy diet includes low-fat dairy products, low-fat (lean) meats, and fiber from whole grains, beans, and lots of fruits and vegetables. Home safety Remove any tripping hazards, such as rugs, cords, and clutter. Install safety equipment such as grab bars in bathrooms and safety rails on stairs. Keep rooms and walkways well-lit. Activity  Follow a regular exercise program to stay fit.  This will help you maintain your balance. Ask your health care provider what types of exercise are appropriate for you. If you need a cane or walker, use it as recommended by your health care provider. Wear supportive shoes that have nonskid soles.  Lifestyle Do not drink alcohol if your health care provider tells you not to  drink. If you drink alcohol, limit how much you have: 0-1 drink a day for women. 0-2 drinks a day for men. Be aware of how much alcohol is in your drink. In the [place] one drink equals one typical bottle of beer (12 oz), one-half glass of wine (5 oz), or one shot of hard liquor (1 oz). Do not use any products that contain nicotine or tobacco, such as cigarettes and e-cigarettes. If you need help quitting, ask your health care provider. Summary Having a healthy lifestyle and getting preventive care can help to protect your health and wellness after age 83. Screening and testing are the best way to find a health problem early and help you avoid having a fall. Early diagnosis and treatment give you the best chance for managing medical conditions that are more common for people who are older than age 4. Falls are a major cause of broken bones and head injuries in people who are older than age 60. Take precautions to prevent a fall at home. Work with your health care provider to learn what changes you can make to improve your health and wellness and to prevent falls. This information is not intended to replace advice given to you by your health care provider. Make sure you discuss any questions you have with your healthcare provider. Document Revised: 07/17/2020 Document Reviewed: 07/17/2020 Elsevier Patient Education  2022 [employer].

## 2021-02-23 NOTE — Assessment & Plan Note (Signed)
S/p redo laminectomy [name])

## 2021-02-23 NOTE — Assessment & Plan Note (Addendum)
Sees urology yearly - advised call to schedule f/u. Previously saw Dr Karsten Ro until his retirement. Currently on flomax and detrol LA

## 2021-02-23 NOTE — Assessment & Plan Note (Signed)
Latest carotid US showing stenosis <39% (2018).  Consider updating. Continue cholesterol control.

## 2021-02-23 NOTE — Assessment & Plan Note (Signed)
Continue b12 oral 105mcg daily.

## 2021-02-23 NOTE — Assessment & Plan Note (Addendum)
Chronic, stable. Continue simvastatin and zetia.  The ASCVD Risk score Mikey Bussing DC Jr., et al., 2013) failed to calculate for the following reasons:   The valid total cholesterol range is 130 to 320 mg/dL

## 2021-02-23 NOTE — Assessment & Plan Note (Signed)
Doubt COPD flare. Recommend f/u with cardiology for further evaluation.

## 2021-02-23 NOTE — Assessment & Plan Note (Signed)
Encouraged limiting added sugars in the diet.

## 2021-02-23 NOTE — Assessment & Plan Note (Signed)
Chronic, stable. Continue current regimen of imdur 30mg  daily, losartan 25mg  daily, metoprolol 12.5mg  daily and lasix 20mg  PRN.

## 2021-02-23 NOTE — Assessment & Plan Note (Addendum)
No obvious claudication. Sees Dr [name].  Continue aspirin, statin.

## 2021-02-23 NOTE — Assessment & Plan Note (Signed)
Followed by cardiology, appreciate their care.  

## 2021-02-23 NOTE — Assessment & Plan Note (Signed)
Continues low dose PPI

## 2021-02-23 NOTE — Assessment & Plan Note (Signed)
Preventative protocols reviewed and updated unless pt declined. Discussed healthy diet and lifestyle.  

## 2021-02-24 DIAGNOSIS — N183 Chronic kidney disease, stage 3 unspecified: Secondary | ICD-10-CM | POA: Insufficient documentation

## 2021-02-24 NOTE — Assessment & Plan Note (Signed)
Reviewed with patient, encouraged limiting NSAIDs, increasing fluid intake.

## 2021-02-24 NOTE — Assessment & Plan Note (Signed)
Progressive neuropathy noted, worse at L foot affecting balance. This is despite gabapentin 300mg  TID. I did ask him to return in 2-3 months for further evaluation.

## 2021-03-16 ENCOUNTER — Other Ambulatory Visit: Payer: Self-pay | Admitting: Cardiovascular Disease

## 2021-03-18 NOTE — Progress Notes (Signed)
Cardiology Office Note  Date:  03/19/2021   ID:  [name], DOB [birth year], MRN [id number]  PCP:  [name] Bush, MD   Chief Complaint  Patient presents with   Shortness of Breath    Patient c/o shortness of breath with very little exertion, left foot swelling, chest tightness comes and goes with little walking and fatigue. Medications reviewed by the patient verbally.    HPI:  80 year old gentleman with hx of OSA, did not tolerate CPAP mild  bilateral carotid arterial disease  <39% bilaterally 08/2016 hyperlipidemia  coronary artery disease,  bypass in 1990,  stenting to the circumflex in 2002,  Last cath 06/2015, patent grafts x 2, RCA small vessel diffuse disease DVT in the left lower extremity,  Completed back surgery for spinal stenosis, 06/20/2016 Cervical neck surgery 01/2017, [name]  total knee surgery in 02/2017 covid 05/20/2019 EF was 35 to 40% in 06/2019 Repeat echo February 2021  ejection fraction 50 to 55% who presents for routine followup  Of his coronary artery disease, post  covid  LOV 10/2020 Lots of complaints today including his shortness of breath, occasional chest tightness, leg pain, feeling down, hypoxia  2-3 weeks ago Started having SOB, mild chest discomfort "Not sure if heart or lung" Late in afternoon with walking  gets chest tightness, and shortness of breath Having some hypoxia , into the 80s at night Uses his albuterol at times which seems to help  Does not take lasix, off 3 to 5 months No edema Does not think this is contributing to SOB  Labs reviewed: CR 1.34 BUN 31 GFR 50  Stress test 10/21, reviewed Low risk, probably normal pharmacologic myocardial perfusion stress test.  LE arterial hx, reviewed today 2.  Right lower extremity: Moderately to severely calcified SFA with diffuse disease throughout its course especially in the midsegment and three-vessel runoff below the knee. 3.  Left lower extremity: Borderline  significant SFA disease which is moderately to severely calcified and three-vessel runoff below the knee.  pulmonary appointment sept, 2021  postinflammatory pulmonary issues post Covid COPD poorly compensated Changes to  his inhalers made  EKG personally reviewed by myself on todays visit Normal sinus rhythm rate 62 bpm rate 80 bpm left anterior fascicular block  Labs reviewed HBA1C 6.1 CR 1.5, high end of range Rare lasix  EKG personally reviewed by myself on todays visit Shows normal sinus rhythm rate 77 bpm ,poor R wave progression,LAFB  Other records reviewed CT chest 06/2019 Diffuse multifocal areas of consolidation and ground-glass opacity throughout the lungs compatible with atypical infection in the setting of known COVID positivity.  Stress test 03/2019 Pharmacological myocardial perfusion imaging study with no significant ischemia Small region mild fixed perfusion defect in the distal anteroseptal and apical region, unable to exclude old scar vs attenuation artifact EF estimated at 38%, septal wall hypokinesis, possibly secondary to CABG/post-operative state.  No EKG changes concerning for ischemia at peak stress or in recovery. Low risk scan  05/2019 COVID , and hospital 10 days treated with remdesivir and steroids, antibodies little bit of leg swelling but he attributed to not taking diuretics   Back in the hospital again for an additional 10-day stay October 28  he started to run another fever up to 101.3 has not had any chest pain or wheezing no headaches. blood pressure has been  running low in the 90s so he stopped using his lisinopril.  His blood pressure after that improved to 130s over 60s  CT  chest,  . Diffuse multifocal areas of consolidation and ground-glass opacity throughout the lungs compatible with atypical infection in the setting of known COVID positivity.  LE arterial 08/2018,   Right lower extremity: Moderately to severely calcified SFA with  diffuse disease throughout its course especially in the midsegment and three-vessel runoff below the knee.   AAA less than 3 cm, on scan in 2018 reviewed with him Numerous previous episode of chest pain relieved with belching consistent with GI etiology.  Previous cardiac catheterization 07/07/2015            Ost LM to LM lesion, 50% stenosed.            Ost RCA lesion, 80% stenosed.            Prox RCA-1 lesion, 80% stenosed.            Prox RCA-2 lesion, 80% stenosed.            LIMA was injected is moderate in size, and is anatomically normal.            Dist LAD lesion, 60% stenosed.            Ost LAD to Prox LAD lesion, 100% stenosed.            Mid LAD lesion, 60% stenosed.            The left ventricular systolic function is normal.            SVG was injected is normal in caliber, and is anatomically normal.            2nd Diag lesion, 50% stenosed.    severe RCA disease (small vessel) not amenable to PCI, moderate left main disease, moderate LAD and proximal diagonal disease. 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  PSH:    Past Surgical History:  Procedure Laterality Date   ABDOMINAL AORTOGRAM W/LOWER EXTREMITY [name] 09/12/2018   Procedure: ABDOMINAL AORTOGRAM W/LOWER EXTREMITY;  Surgeon: [name], MD;  Location: [facility];  Service: Cardiovascular;  Laterality: [name];   APPENDECTOMY     rupture   BACK SURGERY     1984 and then another one [facility]   CARDIAC CATHETERIZATION [name] 07/07/2015   Procedure: Left Heart Cath and Cors/Grafts Angiography;  Surgeon: [name], MD;  Location: [facility];  Service: Cardiovascular;  Laterality: [name];   CATARACT EXTRACTION, BILATERAL     CERVICAL SPINE SURGERY  12/2016   cervical stenosis [name])   COLONOSCOPY  03/2010   HP polyp, diverticulosis, rpt 10 yrs ([name])   CORONARY ARTERY BYPASS GRAFT  1990   3 vessel    CYSTOSCOPY  12/23/10  [facility]   EYE SURGERY     cataract   JOINT REPLACEMENT     KNEE ARTHROSCOPY Right    x2   LAMINOTOMY  1986   L5/S1 lumbar laminotomy for two ruptured discs/fusion   LUMBAR LAMINECTOMY/DECOMPRESSION MICRODISCECTOMY [name] 07/13/2016   Procedure: LUMBAR TWO-THREE, LUMBAR THREE-FOUR, LUMBAR FOUR-FIVE LAMINECTOMY AND FORAMINOTOMY;  Surgeon: [name], MD;  Location: [facility];  Service: Neurosurgery;  Laterality: [name];  LAMINECTOMY AND FORAMINOTOMY L2-L3, L3-L4,L4-L5   LUMBAR SPINE SURGERY  06/2020   Bilateral redo laminectomy/laminotomy/foraminotomies/medial facetectomy to decompress the bilateral L4 and L5 nerve roots [name])   TOTAL HIP ARTHROPLASTY Bilateral [id number]   TOTAL KNEE ARTHROPLASTY Right 03/06/2017   Procedure: RIGHT TOTAL KNEE ARTHROPLASTY;  Surgeon: Gaynelle Arabian, MD;  Location: [facility];  Service: Orthopedics;  Laterality: Right;    Current Outpatient Medications  Medication Sig Dispense Refill   acetaminophen  (TYLENOL) 500 MG tablet Take 500 mg by mouth every 8 (eight) hours as needed.     aspirin EC 81 MG EC tablet Take 1 tablet (81 mg total) by mouth daily. 30 tablet 0   Cyanocobalamin (B-12) 1000 MCG SUBL Place 1 tablet under the tongue daily. 30 each    cyclobenzaprine (FLEXERIL) 10 MG tablet Take 1 tablet (10 mg total) by mouth 3 (three) times daily as needed for muscle spasms. 30 tablet 0   docusate sodium (COLACE) 100 MG capsule Take 100 mg by mouth 2 (two) times daily as needed for mild constipation.     ezetimibe (ZETIA) 10 MG tablet TAKE ONE TABLET BY MOUTH EVERY DAY 90 tablet 2   fluticasone (FLONASE) 50 MCG/ACT nasal spray Place into both nostrils as needed for allergies or rhinitis.     furosemide (LASIX) 20 MG tablet TAKE ONE TABLET BY MOUTH EVERY DAY AS NEEDED 30 tablet 3   gabapentin (NEURONTIN) 300 MG capsule TAKE 1 CAPSULE BY MOUTH THREE TIMES DAILY 270 capsule 1   hydrocortisone 2.5 % lotion Apply topically daily as needed.     isosorbide mononitrate (IMDUR) 30 MG 24 hr tablet TAKE 1 TABLET BY MOUTH AT BEDTIME 90 tablet 2   losartan (COZAAR) 25 MG tablet TAKE 1 TABLET BY MOUTH DAILY 90 tablet 2   metoprolol succinate (TOPROL-XL) 25 MG 24 hr tablet TAKE 1/2 TABLET BY MOUTH EVERY DAY. 45 tablet 1   nitroGLYCERIN (NITROSTAT) 0.4 MG SL tablet Place 1 tablet (0.4 mg total) under the tongue every 5 (five) minutes as needed for chest pain. 25 tablet 3   omeprazole (PRILOSEC) 20 MG capsule TAKE 1 CAPSULE EVERY DAY 30 capsule 4   polyethylene glycol (MIRALAX / GLYCOLAX) 17 g packet Take 17 g by mouth daily as needed for mild constipation.      PROAIR HFA 108 (90 Base) MCG/ACT inhaler TAKE 2 PUFFS INTO LUNGS EVERY 6 HOURS ASNEEDED FOR WHEEZING OR SHORTNESS OF BREATH 8.5 g 6   ranolazine (RANEXA) 500 MG 12 hr tablet TAKE ONE TABLET TWICE DAILY 180 tablet 2   simvastatin (ZOCOR) 40 MG tablet TAKE 1 TABLET BY MOUTH DAILY 90 tablet 2   tamsulosin (FLOMAX) 0.4 MG CAPS capsule Take 0.4 mg by mouth  at bedtime.     tolterodine (DETROL LA) 4 MG 24 hr capsule Take 4 mg by mouth daily.     No current facility-administered medications for this visit.     Allergies:   Vioxx [rofecoxib], Spiriva respimat [tiotropium bromide monohydrate], Contrast media [iodinated diagnostic agents], and Morphine   Social History:  The patient  reports that he quit smoking about 32 years ago. His smoking use included cigarettes. He has a 25.00 pack-year smoking history. He has never used smokeless tobacco. He reports that he does not drink alcohol and does not use drugs.   Family History:   family history includes Cancer in his sister and another family member; Diabetes in his brother and mother; Heart attack in his mother; Heart disease in his father; Kidney disease in his father and sister; Stroke in his father and mother.   Review of Systems: Review of Systems  Constitutional: Negative.   HENT: Negative.    Respiratory: Negative.    Cardiovascular: Negative.   Gastrointestinal: Negative.   Musculoskeletal: Negative.   Neurological: Negative.   Psychiatric/Behavioral: Negative.    All other systems reviewed and are negative.  PHYSICAL EXAM: VS:  BP 130/60 (BP Location: Left Arm, Patient Position: Sitting, Cuff Size: Normal)   Pulse 80   Ht 6' (1.829 m)   Wt 220 lb 6 oz (100 kg)   SpO2 97%   BMI 29.89 kg/m  , BMI Body mass index is 29.89 kg/m. Constitutional:  oriented to person, place, and time. No distress.  HENT:  Head: Grossly normal Eyes:  no discharge. No scleral icterus.  Neck: No JVD, no carotid bruits  Cardiovascular: Regular rate and rhythm, no murmurs appreciated Pulmonary/Chest: Clear to auscultation bilaterally, no wheezes or rails Abdominal: Soft.  no distension.  no tenderness.  Musculoskeletal: Normal range of motion Neurological:  normal muscle tone. Coordination normal. No atrophy Skin: Skin warm and dry Psychiatric: normal affect, pleasant   Recent Labs: 07/16/2020: B  Natriuretic Peptide 178.6 02/16/2021: ALT 13; BUN 31; Creatinine, Ser 1.34; Hemoglobin 13.8; Platelets 156.0; Potassium 4.8; Sodium 140; TSH 2.77    Lipid Panel Lab Results  Component Value Date   CHOL 114 02/16/2021   HDL 33.40 (L) 02/16/2021   LDLCALC 62 02/16/2021   TRIG 92.0 02/16/2021      Wt Readings from Last 3 Encounters:  03/19/21 220 lb 6 oz (100 kg)  02/23/21 220 lb (99.8 kg)  12/03/20 221 lb 2 oz (100.3 kg)     ASSESSMENT AND PLAN:   Cardiomyopathy, post covid Recovered, EF up to 50 -55% by echo 09/2019 Reports having shortness of breath, differential likely multifactorial Given what seems to be new shortness of breath we have ordered repeat echocardiogram  Cad with stable angina Very vague symptoms, unable to exclude ischemia, we did discuss stress testing with him, he does not want cardiac catheterization We will wait on results of the echo before pursuing stress test  Covid , 05/2019 Chronic shortness of breath recovered  COPD/emphysema Prior smoking history, Covid 2020 Followed by pulmonary On inhalers Recommend he follow-up with pulmonary to discuss his shortness of breath  Mixed hyperlipidemia Cholesterol is at goal on the current lipid regimen. No changes to the medications were made.  Essential hypertension Blood pressure is well controlled on today's visit. No changes made to the medications.  Bilateral carotid artery stenosis Mild bilateral carotid disease less than 39% bilaterally Continue aggressive lipid management  Diet-controlled diabetes mellitus (HCC) Weight stable Higher numbers with prednisone  Ex-smoker Nonsmoker  PAD Follows with Dr. [name] ,  bilateral SFA disease We have ordered repeat lower extremity arterial Doppler with follow-up clinic visit with Dr. [name]    Total encounter time more than 25 minutes  Greater than 50% was spent in counseling and coordination of care with the patient   No  orders of the defined types  were placed in this encounter.    Signed, Esmond Plants, M.D., Ph.D. 03/19/2021  [facility] [place], [place]

## 2021-03-19 ENCOUNTER — Other Ambulatory Visit: Payer: Self-pay

## 2021-03-19 ENCOUNTER — Encounter: Payer: Self-pay | Admitting: Cardiovascular Disease

## 2021-03-19 ENCOUNTER — Ambulatory Visit: Payer: PPO | Admitting: Cardiovascular Disease

## 2021-03-19 VITALS — BP 130/60 | HR 80 | Ht 72.0 in | Wt 220.4 lb

## 2021-03-19 DIAGNOSIS — I25118 Atherosclerotic heart disease of native coronary artery with other forms of angina pectoris: Secondary | ICD-10-CM | POA: Diagnosis not present

## 2021-03-19 DIAGNOSIS — I10 Essential (primary) hypertension: Secondary | ICD-10-CM | POA: Diagnosis not present

## 2021-03-19 DIAGNOSIS — R0602 Shortness of breath: Secondary | ICD-10-CM

## 2021-03-19 DIAGNOSIS — I6523 Occlusion and stenosis of bilateral carotid arteries: Secondary | ICD-10-CM

## 2021-03-19 DIAGNOSIS — I739 Peripheral vascular disease, unspecified: Secondary | ICD-10-CM | POA: Diagnosis not present

## 2021-03-19 DIAGNOSIS — I5032 Chronic diastolic (congestive) heart failure: Secondary | ICD-10-CM

## 2021-03-19 DIAGNOSIS — E782 Mixed hyperlipidemia: Secondary | ICD-10-CM

## 2021-03-19 MED ORDER — FUROSEMIDE 20 MG PO TABS: 20.0000 mg | ORAL_TABLET | Freq: Every day | ORAL | 3 refills | Status: DC | PRN

## 2021-03-19 MED ORDER — RANOLAZINE ER 500 MG PO TB12: 500.0000 mg | ORAL_TABLET | Freq: Two times a day (BID) | ORAL | 3 refills | Status: DC

## 2021-03-19 MED ORDER — ISOSORBIDE MONONITRATE ER 30 MG PO TB24: 30.0000 mg | ORAL_TABLET | Freq: Every day | ORAL | 3 refills | Status: DC

## 2021-03-19 MED ORDER — SIMVASTATIN 40 MG PO TABS: 40.0000 mg | ORAL_TABLET | Freq: Every day | ORAL | 3 refills | Status: DC

## 2021-03-19 MED ORDER — METOPROLOL SUCCINATE ER 25 MG PO TB24: 12.5000 mg | ORAL_TABLET | Freq: Every day | ORAL | 3 refills | Status: DC

## 2021-03-19 MED ORDER — LOSARTAN POTASSIUM 25 MG PO TABS: 25.0000 mg | ORAL_TABLET | Freq: Every day | ORAL | 3 refills | Status: DC

## 2021-03-19 MED ORDER — NITROGLYCERIN 0.4 MG SL SUBL: 0.4000 mg | SUBLINGUAL_TABLET | SUBLINGUAL | 3 refills | Status: DC | PRN

## 2021-03-19 MED ORDER — EZETIMIBE 10 MG PO TABS: 10.0000 mg | ORAL_TABLET | Freq: Every day | ORAL | 3 refills | Status: DC

## 2021-03-19 NOTE — Patient Instructions (Addendum)
Need follow-up with pulmonary, please make an appt  Medication Instructions:  No changes  If you need a refill on your cardiac medications before your next appointment, please call your pharmacy.   Lab work: No new labs needed  Testing/Procedures: (we will call with results via phone, you will see on MyChart, but you will get a phone call) Lower extremity ultrasound Follow-up with Dr. [name] after results Echo for shortness of breath  Follow-Up: At [facility], you and your health needs are our priority.  As part of our continuing mission to provide you with exceptional heart care, we have created designated Provider Care Teams.  These Care Teams include your primary Cardiologist (physician) and Advanced Practice Providers (APPs -  Physician Assistants and Nurse Practitioners) who all work together to provide you with the care you need, when you need it.  You will need a follow up appointment in : [place] or APP in 6 months Providers on your designated Care Team:   Murray Hodgkins, NP Christell Faith, PA-C Marrianne Mood, PA-C Cadence Kathlen Mody, [place]  Any Other Special Instructions Will Be Listed Below (If Applicable).  COVID-19 Vaccine Information can be found at: ShippingScam.co.uk For questions related to vaccine distribution or appointments, please email [email or web address] or call [phone].    Your physician has requested that you have an echocardiogram. Echocardiography is a painless test that uses sound waves to create images of your heart. It provides your doctor with information about the size and shape of your heart and how well your heart's chambers and valves are working. This procedure takes approximately one hour. There are no restrictions for this procedure.  There is a possibility that an IV may need to be started during your test to inject an image enhancing agent. This is done to obtain more optimal  pictures of your heart. Therefore we ask that you do at least drink some water prior to coming in to hydrate your veins.

## 2021-03-22 ENCOUNTER — Ambulatory Visit (INDEPENDENT_AMBULATORY_CARE_PROVIDER_SITE_OTHER): Payer: PPO

## 2021-03-22 ENCOUNTER — Other Ambulatory Visit: Payer: Self-pay

## 2021-03-22 DIAGNOSIS — R0602 Shortness of breath: Secondary | ICD-10-CM | POA: Diagnosis not present

## 2021-03-23 ENCOUNTER — Encounter: Payer: Self-pay | Admitting: Pulmonary Disease

## 2021-03-23 ENCOUNTER — Other Ambulatory Visit
Admission: RE | Admit: 2021-03-23 | Discharge: 2021-03-23 | Disposition: A | Discharge status: Home / Self Care | Payer: PPO | Source: Ambulatory Visit | Attending: Pulmonary Disease | Admitting: Pulmonary Disease

## 2021-03-23 ENCOUNTER — Encounter: Payer: Self-pay | Admitting: Family Medicine

## 2021-03-23 ENCOUNTER — Ambulatory Visit: Payer: PPO | Admitting: Pulmonary Disease

## 2021-03-23 VITALS — BP 120/60 | HR 76 | Temp 97.5°F | Ht 72.0 in | Wt 219.6 lb

## 2021-03-23 DIAGNOSIS — J8410 Pulmonary fibrosis, unspecified: Secondary | ICD-10-CM

## 2021-03-23 DIAGNOSIS — R0602 Shortness of breath: Secondary | ICD-10-CM | POA: Diagnosis not present

## 2021-03-23 DIAGNOSIS — J449 Chronic obstructive pulmonary disease, unspecified: Secondary | ICD-10-CM

## 2021-03-23 DIAGNOSIS — Z01812 Encounter for preprocedural laboratory examination: Secondary | ICD-10-CM | POA: Diagnosis not present

## 2021-03-23 DIAGNOSIS — Z20822 Contact with and (suspected) exposure to covid-19: Secondary | ICD-10-CM | POA: Diagnosis not present

## 2021-03-23 LAB — ECHOCARDIOGRAM COMPLETE
AR max vel: 2.11 cm2
AV Area VTI: 2.3 cm2
AV Area mean vel: 2.11 cm2
AV Mean grad: 6 mmHg
AV Peak grad: 10.5 mmHg
Ao pk vel: 1.62 m/s
Area-P 1/2: 2.56 cm2
Calc EF: 52.8 %
S' Lateral: 3.7 cm
Single Plane A2C EF: 52.6 %
Single Plane A4C EF: 54.7 %

## 2021-03-23 LAB — CBC WITH DIFFERENTIAL/PLATELET
Abs Immature Granulocytes: 0.02 10*3/uL (ref 0.00–0.07)
Basophils Absolute: 0.1 10*3/uL (ref 0.0–0.1)
Basophils Relative: 1 %
Eosinophils Absolute: 0.3 10*3/uL (ref 0.0–0.5)
Eosinophils Relative: 5 %
HCT: 39.1 % (ref 39.0–52.0)
Hemoglobin: 13.9 g/dL (ref 13.0–17.0)
Immature Granulocytes: 0 %
Lymphocytes Relative: 38 %
Lymphs Abs: 2.2 10*3/uL (ref 0.7–4.0)
MCH: 33.5 pg (ref 26.0–34.0)
MCHC: 35.5 g/dL (ref 30.0–36.0)
MCV: 94.2 fL (ref 80.0–100.0)
Monocytes Absolute: 0.6 10*3/uL (ref 0.1–1.0)
Monocytes Relative: 11 %
Neutro Abs: 2.6 10*3/uL (ref 1.7–7.7)
Neutrophils Relative %: 45 %
Platelets: 149 10*3/uL — ABNORMAL LOW (ref 150–400)
RBC: 4.15 MIL/uL — ABNORMAL LOW (ref 4.22–5.81)
RDW: 12 % (ref 11.5–15.5)
WBC: 5.8 10*3/uL (ref 4.0–10.5)
nRBC: 0 % (ref 0.0–0.2)

## 2021-03-23 LAB — SARS CORONAVIRUS 2 (TAT 6-24 HRS): SARS Coronavirus 2: NEGATIVE

## 2021-03-23 NOTE — Progress Notes (Signed)
Subjective:    Patient ID: [name], male    DOB: [birth year], 80 y.o.   MRN: [id number] Chief Complaint  Patient presents with   Follow-up    HPI [name] is a 80 year old former smoker (quit 1990, [place] 25) who presents for reevaluation of dyspnea.  He was last seen here on 26 August 2020.  Recall that he had significant issues with shortness of breath after COVID-19 infection in October 2020.  He had a prolonged course with his COVID requiring 2 hospitalizations due to relapsing symptoms.  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  Initially evaluated him here in July 2021 and at that time pulmonary function testing was ordered but did not show overt obstruction.  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  He had been given a trial of Trelegy Ellipta however did not feel that this was particularly helpful.  He continues to use albuterol as needed and felt he did not need anything else.  We had ordered overnight oximetry on a prior visit and he never had this completed.  As noted at that time of his follow-up in January he felt he was doing well and we released him back to the care of his primary care physician.  He did not started having issues with dyspnea again, however his dyspnea has been out of proportion to his pulmonary testing findings.  He had a 2D echo performed yesterday results which are not back yet.  He states that Dr. Rockey Situ has expressed concern that his pulmonary function is worsening.  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  He does not endorse any fevers, chills or sweats.  No orthopnea or paroxysmal nocturnal dyspnea.  No lower extremity edema now that he is on Lasix.  No chest pain and no cough or sputum production.  No hemoptysis.  He does not endorse any other  symptomatology today  Review of Systems A 10 point review of systems was performed and it is as noted above otherwise negative.  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[place]) 07/08/2019   Anxiety 06/04/2019   Chronic right shoulder pain 09/10/2018   Fall 03/08/2018   Cervical stenosis of spinal canal 06/05/2017   Pedal edema 06/05/2017   Health maintenance examination 12/06/2016   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/13/2016   Overweight (BMI 25.0-29.9) 07/04/2016   Low vitamin B12 level 01/01/2016   Exertional dyspnea 07/07/2015   Medicare annual wellness visit, subsequent 07/03/2015   Advanced care planning/counseling discussion 07/03/2015   Prediabetes 05/04/2015   Chest pain 05/04/2015   Ex-smoker 05/04/2015   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   Spermatocele 04/02/2013   Syncopal vertigo 11/04/2010   Lumbar disc disease with radiculopathy 11/04/2010   CAD, ARTERY BYPASS GRAFT 08/11/2009   PVD (peripheral vascular disease) with claudication ([place]) 08/11/2009   DUPUYTREN'S CONTRACTURE, 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   Carotid stenosis 07/09/2008   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   Benign prostatic hyperplasia with urinary obstruction 09/06/2007   Essential hypertension 04/30/2007   GERD 04/30/2007  Hyperlipidemia 04/26/2007   OA (osteoarthritis) of knee 04/26/2007   DVT, HX OF 04/26/2007   Social History   Tobacco Use   Smoking status: Former    Packs/day: 1.00    Years: 25.00    Pack years: 25.00    Types: Cigarettes    Quit date: 08/15/1988    Years since quitting: 32.6   Smokeless tobacco: Never  Substance Use Topics   Alcohol use: No    Alcohol/week: 0.0 standard  drinks   Allergies  Allergen Reactions   Vioxx [Rofecoxib] Other (See Comments)    Hemorrhage    Spiriva Respimat [Tiotropium Bromide Monohydrate] Other (See Comments)    Elevated bp   Contrast Media [Iodinated Diagnostic Agents] Itching and Rash    Delayed reaction post abdominal aortagram.    Morphine Nausea Only and Other (See Comments)    Irritability    Current Meds  Medication Sig   acetaminophen (TYLENOL) 500 MG tablet Take 500 mg by mouth every 8 (eight) hours as needed.   aspirin EC 81 MG EC tablet Take 1 tablet (81 mg total) by mouth daily.   Cyanocobalamin (B-12) 1000 MCG SUBL Place 1 tablet under the tongue daily.   cyclobenzaprine (FLEXERIL) 10 MG tablet Take 1 tablet (10 mg total) by mouth 3 (three) times daily as needed for muscle spasms.   docusate sodium (COLACE) 100 MG capsule Take 100 mg by mouth 2 (two) times daily as needed for mild constipation.   ezetimibe (ZETIA) 10 MG tablet Take 1 tablet (10 mg total) by mouth daily.   fluticasone (FLONASE) 50 MCG/ACT nasal spray Place into both nostrils as needed for allergies or rhinitis.   furosemide (LASIX) 20 MG tablet Take 1 tablet (20 mg total) by mouth daily as needed.   gabapentin (NEURONTIN) 300 MG capsule TAKE 1 CAPSULE BY MOUTH THREE TIMES DAILY   hydrocortisone 2.5 % lotion Apply topically daily as needed.   isosorbide mononitrate (IMDUR) 30 MG 24 hr tablet Take 1 tablet (30 mg total) by mouth at bedtime.   losartan (COZAAR) 25 MG tablet Take 1 tablet (25 mg total) by mouth daily.   metoprolol succinate (TOPROL-XL) 25 MG 24 hr tablet Take 0.5 tablets (12.5 mg total) by mouth daily.   nitroGLYCERIN (NITROSTAT) 0.4 MG SL tablet Place 1 tablet (0.4 mg total) under the tongue every 5 (five) minutes as needed for chest pain.   omeprazole (PRILOSEC) 20 MG capsule TAKE 1 CAPSULE EVERY DAY   polyethylene glycol (MIRALAX / GLYCOLAX) 17 g packet Take 17 g by mouth daily as needed for mild constipation.    PROAIR HFA 108  (90 Base) MCG/ACT inhaler TAKE 2 PUFFS INTO LUNGS EVERY 6 HOURS ASNEEDED FOR WHEEZING OR SHORTNESS OF BREATH   ranolazine (RANEXA) 500 MG 12 hr tablet Take 1 tablet (500 mg total) by mouth 2 (two) times daily.   simvastatin (ZOCOR) 40 MG tablet Take 1 tablet (40 mg total) by mouth daily.   tamsulosin (FLOMAX) 0.4 MG CAPS capsule Take 0.4 mg by mouth at bedtime.   tolterodine (DETROL LA) 4 MG 24 hr capsule Take 4 mg by mouth daily.   Immunization History  Administered Date(s) Administered   Fluad Quad(high Dose 65+) 04/17/2019   Influenza Whole 08/15/2000, 07/04/2007, 05/09/2008, 05/31/2010   Influenza, High Dose Seasonal PF 05/15/2013, 06/25/2015   Influenza,inj,Quad PF,6+ Mos 04/17/2014, 05/18/2016, 06/07/2018   Influenza-Unspecified 05/22/2017, 05/28/2020   PFIZER(Purple Top)SARS-COV-2 Vaccination 09/07/2019, 09/28/2019   Pneumococcal Conjugate-13 02/09/2015   Pneumococcal Polysaccharide-23 08/15/2000,  10/29/2008   Td 08/15/2005   Tdap 02/10/2018   Zoster Recombinat (Shingrix) 04/05/2017, 07/25/2017   Zoster, Live 02/04/2009       Objective:   Physical Exam BP 120/60 (BP Location: Left Arm, Patient Position: Sitting, Cuff Size: Normal)   Pulse 76   Temp (!) 97.5 F (36.4 C) (Oral)   Ht 6' (1.829 m)   Wt 219 lb 9.6 oz (99.6 kg)   SpO2 94%   BMI 29.78 kg/m  GENERAL: This is a well-developed, overweight gentleman who is awake, alert, no acute distress.  Fully ambulatory.  No conversational dyspnea.   HEAD: Normocephalic, atraumatic. EYES: Pupils equal, round, reactive to light.  No scleral icterus. MOUTH: Nose/mouth/throat not examined due to masking requirements for COVID 19. NECK: Supple. No thyromegaly. Trachea midline. No JVD.  No adenopathy. PULMONARY: Good air entry bilaterally, no adventitious sounds. CARDIOVASCULAR: S1 and S2. Regular rate and rhythm.  Grade 1/6 systolic ejection murmur left sternal border as prior. GASTROINTESTINAL: Protuberant abdomen (mostly truncal  obesity), otherwise benign.   MUSCULOSKELETAL: No joint deformity, no clubbing, no edema. NEUROLOGIC: No overt focal deficits noted.  Speech is fluent.   SKIN: Intact,warm,dry.  Limited exam shows no rashes. PSYCH: Mood and behavior normal.       Assessment & Plan:     ICD-10-CM   1. COPD with emphysema ([place])  J44.9 CBC with Differential/Platelet    Pulmonary Function Test [facility] Only    CANCELED: Pulse oximetry, overnight   Query worsening Patient was not compliant with prior maintenance inhaler PFTs Follow-up 4-6 weeks Reassess with PFTs    2. Postinflammatory pulmonary fibrosis ([facility])  J84.10 CBC with Differential/Platelet    Pulmonary Function Test [facility] Only    CANCELED: Pulse oximetry, overnight   Post COVID This is not a progressive issue    3. Shortness of breath  R06.02    Reevaluating with PFTs as above CBC to rule out anemia     Orders Placed This Encounter  Procedures   CBC with Differential/Platelet    Standing Status:   Future    Number of Occurrences:   1    Standing Expiration Date:   03/23/2022   Pulmonary Function Test [facility] Only    Standing Status:   Future    Number of Occurrences:   1    Standing Expiration Date:   03/23/2022    Order Specific Question:   Full PFT: includes the following: basic spirometry, spirometry pre & post bronchodilator, diffusion capacity (DLCO), lung volumes    Answer:   Full PFT    We will reassess the patient's pulmonary function.  A CBC to rule out anemia and PFTs will be obtained.  We will see him in follow-up in 4 to 6 weeks time he is to contact us prior to that time should any new difficulties arise.  Renold Don, MD [facility]    *This note was dictated using voice recognition software/Dragon.  Despite best efforts to proofread, errors can occur which can change the meaning.  Any change was purely unintentional.

## 2021-03-23 NOTE — Patient Instructions (Signed)
We are going to check blood test and breathing tests again.  We are also going to check your oxygen level at nighttime.  I will check on the echocardiogram when it is read.  I do not think that your shortness of breath is coming from worsening function of your lungs at this point.  We will see what these studies reveal.  We will see you in follow-up in 4 to 6 weeks time you may either see me or the nurse practitioner at that time.

## 2021-03-23 NOTE — Telephone Encounter (Signed)
Updated pt's chart.  

## 2021-03-24 ENCOUNTER — Other Ambulatory Visit: Payer: Self-pay

## 2021-03-24 ENCOUNTER — Ambulatory Visit: Payer: PPO | Attending: Pulmonary Disease

## 2021-03-24 ENCOUNTER — Telehealth: Payer: Self-pay

## 2021-03-24 DIAGNOSIS — Z79899 Other long term (current) drug therapy: Secondary | ICD-10-CM | POA: Diagnosis not present

## 2021-03-24 DIAGNOSIS — Z87891 Personal history of nicotine dependence: Secondary | ICD-10-CM | POA: Diagnosis not present

## 2021-03-24 DIAGNOSIS — J8410 Pulmonary fibrosis, unspecified: Secondary | ICD-10-CM | POA: Diagnosis not present

## 2021-03-24 DIAGNOSIS — J439 Emphysema, unspecified: Secondary | ICD-10-CM | POA: Insufficient documentation

## 2021-03-24 DIAGNOSIS — N183 Chronic kidney disease, stage 3 unspecified: Secondary | ICD-10-CM

## 2021-03-24 DIAGNOSIS — J449 Chronic obstructive pulmonary disease, unspecified: Secondary | ICD-10-CM

## 2021-03-24 MED ORDER — FUROSEMIDE 20 MG PO TABS: 20.0000 mg | ORAL_TABLET | ORAL | 3 refills | Status: DC

## 2021-03-24 NOTE — Telephone Encounter (Signed)
Able to reach pt regarding his recent ECHO Dr. Rockey Situ had a chance to review his results and advised   "Echo  Low normal EF,  Unchanged  There is mid to moderately elevated right heart pressures  This can be seen in COPD,  Higher pressure can correlate to his SOB,  Would consider restart lasix daily, or at least every other day in effort to drop these pressures, this might help symptoms  Ischemic/blockages should be considered if symptoms do not stabiliaze "   [name] concern for his new dx of CKD, advised can start lasix 20 mg QOD and have repeat labs (BMP) in 1-2 weeks to f/u on his renal function. Pt agrees to plan.   Also advised to reach out to PCP to see if Dr. Danise Mina will refer him to [facility] if he is concern for recent lab numbers of his renal system and .for medications that can he harmful to his renal system. Pt reports will call to advise.   Has NP urology appt on 8/18, will have labs obtain at [facility] on that day as well  Otherwise all questions or concerns were address and no additional concerns at this time. Agreeable to plan, will call back for anything further.

## 2021-03-25 ENCOUNTER — Telehealth: Payer: Self-pay

## 2021-03-25 NOTE — Telephone Encounter (Signed)
[facility] has already completed Covid testing and PFT, nothing further needed.

## 2021-03-30 ENCOUNTER — Encounter: Payer: Self-pay | Admitting: *Deleted

## 2021-04-01 ENCOUNTER — Ambulatory Visit: Payer: PPO | Admitting: Urology

## 2021-04-01 ENCOUNTER — Other Ambulatory Visit: Payer: Self-pay

## 2021-04-01 ENCOUNTER — Encounter: Payer: Self-pay | Admitting: Urology

## 2021-04-01 ENCOUNTER — Other Ambulatory Visit
Admission: RE | Admit: 2021-04-01 | Discharge: 2021-04-01 | Disposition: A | Discharge status: Home / Self Care | Payer: PPO | Attending: Cardiovascular Disease | Admitting: Cardiovascular Disease

## 2021-04-01 VITALS — BP 142/78 | HR 72 | Ht 72.0 in | Wt 215.0 lb

## 2021-04-01 DIAGNOSIS — N183 Chronic kidney disease, stage 3 unspecified: Secondary | ICD-10-CM

## 2021-04-01 DIAGNOSIS — Z79899 Other long term (current) drug therapy: Secondary | ICD-10-CM

## 2021-04-01 DIAGNOSIS — N401 Enlarged prostate with lower urinary tract symptoms: Secondary | ICD-10-CM | POA: Diagnosis not present

## 2021-04-01 DIAGNOSIS — N138 Other obstructive and reflux uropathy: Secondary | ICD-10-CM

## 2021-04-01 LAB — BLADDER SCAN AMB NON-IMAGING: SCA Result: 69

## 2021-04-01 LAB — BASIC METABOLIC PANEL
Anion gap: 8 (ref 5–15)
BUN: 30 mg/dL — ABNORMAL HIGH (ref 8–23)
CO2: 25 mmol/L (ref 22–32)
Calcium: 9.1 mg/dL (ref 8.9–10.3)
Chloride: 104 mmol/L (ref 98–111)
Creatinine, Ser: 1.36 mg/dL — ABNORMAL HIGH (ref 0.61–1.24)
GFR, Estimated: 53 mL/min — ABNORMAL LOW (ref >60)
Glucose, Bld: 100 mg/dL — ABNORMAL HIGH (ref 70–99)
Potassium: 4.6 mmol/L (ref 3.5–5.1)
Sodium: 137 mmol/L (ref 135–145)

## 2021-04-01 NOTE — Progress Notes (Signed)
04/01/2021 1:20 PM   [facility] September 16, 1940 [id number]  Referring provider: Ria Bush, MD [address] [place],  [place] [address]  Chief Complaint  Patient presents with   Other    HPI: [name] is an 80 y.o. male requesting to transfer care from [facility] in [place].  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 06/2019 Echo (in setting of COVID): EF 35-40%, mild LVH, g1 DD, glob HK; b. 09/2019 Echo: eF 50-55%, no rwma, Gr1 DD, nl RV size/fxn.   Actinic keratosis    Anginal pain ([place])    BENIGN PROSTATIC HYPERTROPHY, WITH URINARY OBSTRUCTION 09/06/2007   CAD s/p CABG    a. 1990 s/p MI-->CABG x 2; b. 2002 s/p BMS to LCX; c. 06/2015 Cath: LM 50ost, LAD 100ost/p, 72md, D2 nl, LCX  patent stent, RCA 80p (small), LIMA->LAD nl, VG->D2 nl-->Med Rx; d. 05/2020 MV: fixed apical ant/apical defect. No signif ischemia.   Carotid arterial disease ([place]    a. 08/2016 Carotid U/S: <39% bilat.   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   Community acquired pneumonia of right lower lobe of lung 06/05/2017   COVID-19 virus infection 05/30/2019   Covid PNA with hospitalization 05/2019   DUPUYTREN'S CONTRACTURE, 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   DVT, HX OF 1998   Emphysema of lung ([place]    GERD 04/30/2007   Headache    hx migraines   History of hiatal hernia    History of shingles    HYPERLIPIDEMIA 04/26/2007   HYPERTENSION 04/30/2007   INGUINAL HERNIA, RIGHT 05/26/2010   Laceration of skin of left hand 03/08/2018   Lumbar disc disease with radiculopathy    neuropathy in feet    Myocardial infarction ([place]    1989, 2002   OSA (obstructive sleep apnea)    a. did not tolerate CPAP.   Osteoarthritis    PAD (peripheral artery disease) ([facility]    a. 07/2017 LE duplex: RSFA [id number]; c. 08/2018 Periph Angio: No signif AoIliac dzs. Mod-sev Ca2+ RSFA w/ diff dzs throughout- 3 vessel runoff. Borderline signif LSFA dzs w/ mod-sev Ca2+ vessels and 3 vessel runoff below the knee-->Med rx.   Pneumonia 07/2014   [facility] hospitalization   Pneumonia due to COVID-19 virus 06/02/2019   Pre-diabetes    PSA, INCREASED 07/09/2008   Spinal stenosis of lumbar region     Surgical History: Past Surgical History:  Procedure Laterality Date   ABDOMINAL AORTOGRAM W/LOWER EXTREMITY [name] 09/12/2018   Procedure: ABDOMINAL AORTOGRAM W/LOWER EXTREMITY;  Surgeon: AriWellington HampshireD;  Location: [facility];  Service: Cardiovascular;  Laterality: [name];   APPENDECTOMY     rupture   BACK SURGERY     [facility]   CARDIAC CATHETERIZATION [name] 07/07/2015   Procedure: Left Heart Cath and Cors/Grafts Angiography;  Surgeon: TimMinna MerrittsD;  [facility];  Service: Cardiovascular;  Laterality: [name];   CATARACT EXTRACTION, BILATERAL     CERVICAL SPINE SURGERY  12/2016   cervical stenosis ([name] COLONOSCOPY  03/2010  HP polyp, diverticulosis, rpt 10 yrs ([name])   CORONARY ARTERY BYPASS GRAFT  1990   3 vessel    CYSTOSCOPY  12/23/10   [facility]   EYE SURGERY     cataract   JOINT REPLACEMENT     KNEE ARTHROSCOPY Right    x2   LAMINOTOMY  1986   L5/S1 lumbar laminotomy for two ruptured discs/fusion   LUMBAR LAMINECTOMY/DECOMPRESSION MICRODISCECTOMY [name] 07/13/2016   Procedure: LUMBAR TWO-THREE, LUMBAR THREE-FOUR, LUMBAR FOUR-FIVE LAMINECTOMY AND FORAMINOTOMY;  Surgeon: [name], MD;  Location: [facility];  Service: Neurosurgery;  Laterality: [name];  LAMINECTOMY AND FORAMINOTOMY L2-L3, L3-L4,L4-L5   LUMBAR SPINE SURGERY  06/2020    Bilateral redo laminectomy/laminotomy/foraminotomies/medial facetectomy to decompress the bilateral L4 and L5 nerve roots [name])   TOTAL HIP ARTHROPLASTY Bilateral [id number]   TOTAL KNEE ARTHROPLASTY Right 03/06/2017   Procedure: RIGHT TOTAL KNEE ARTHROPLASTY;  Surgeon: Gaynelle Arabian, MD;  Location: [facility];  Service: Orthopedics;  Laterality: Right;    Home Medications:  Allergies as of 04/01/2021       Reactions   Vioxx [rofecoxib] Other (See Comments)   Hemorrhage    Spiriva Respimat [tiotropium Bromide Monohydrate] Other (See Comments)   Elevated bp   Contrast Media [iodinated Diagnostic Agents] Itching, Rash   Delayed reaction post abdominal aortagram.    Morphine Nausea Only, Other (See Comments)   Irritability        Medication List        Accurate as of April 01, 2021  1:20 PM. If you have any questions, ask your nurse or doctor.          acetaminophen 500 MG tablet Commonly known as: TYLENOL Take 500 mg by mouth every 8 (eight) hours as needed.   aspirin 81 MG EC tablet Take 1 tablet (81 mg total) by mouth daily.   B-12 1000 MCG Subl Place 1 tablet under the tongue daily.   cyclobenzaprine 10 MG tablet Commonly known as: FLEXERIL Take 1 tablet (10 mg total) by mouth 3 (three) times daily as needed for muscle spasms.   docusate sodium 100 MG capsule Commonly known as: COLACE Take 100 mg by mouth 2 (two) times daily as needed for mild constipation.   ezetimibe 10 MG tablet Commonly known as: ZETIA Take 1 tablet (10 mg total) by mouth daily.   fluticasone 50 MCG/ACT nasal spray Commonly known as: FLONASE Place into both nostrils as needed for allergies or rhinitis.   furosemide 20 MG tablet Commonly known as: LASIX Take 1 tablet (20 mg total) by mouth every other day.   gabapentin 300 MG capsule Commonly known as: NEURONTIN TAKE 1 CAPSULE BY MOUTH THREE TIMES DAILY   hydrocortisone 2.5 % lotion Apply topically daily as needed.   isosorbide  mononitrate 30 MG 24 hr tablet Commonly known as: IMDUR Take 1 tablet (30 mg total) by mouth at bedtime.   losartan 25 MG tablet Commonly known as: COZAAR Take 1 tablet (25 mg total) by mouth daily.   metoprolol succinate 25 MG 24 hr tablet Commonly known as: TOPROL-XL Take 0.5 tablets (12.5 mg total) by mouth daily.   nitroGLYCERIN 0.4 MG SL tablet Commonly known as: NITROSTAT Place 1 tablet (0.4 mg total) under the tongue every 5 (five) minutes as needed for chest pain.   omeprazole 20 MG capsule Commonly known as: PRILOSEC TAKE 1 CAPSULE EVERY DAY   polyethylene glycol 17 g packet Commonly known as: MIRALAX / GLYCOLAX Take 17 g by mouth daily as needed  for mild constipation.   ProAir HFA 108 (90 Base) MCG/ACT inhaler Generic drug: albuterol TAKE 2 PUFFS INTO LUNGS EVERY 6 HOURS ASNEEDED FOR WHEEZING OR SHORTNESS OF BREATH   ranolazine 500 MG 12 hr tablet Commonly known as: RANEXA Take 1 tablet (500 mg total) by mouth 2 (two) times daily.   simvastatin 40 MG tablet Commonly known as: ZOCOR Take 1 tablet (40 mg total) by mouth daily.   tamsulosin 0.4 MG Caps capsule Commonly known as: FLOMAX Take 0.4 mg by mouth at bedtime.   tolterodine 4 MG 24 hr capsule Commonly known as: DETROL LA Take 4 mg by mouth daily.        Allergies:  Allergies  Allergen Reactions   Vioxx [Rofecoxib] Other (See Comments)    Hemorrhage    Spiriva Respimat [Tiotropium Bromide Monohydrate] Other (See Comments)    Elevated bp   Contrast Media [Iodinated Diagnostic Agents] Itching and Rash    Delayed reaction post abdominal aortagram.    Morphine Nausea Only and Other (See Comments)    Irritability     Family History: Family History  Problem Relation Age of Onset   Stroke Mother    Heart attack Mother    Diabetes Mother    Stroke Father    Heart disease Father    Kidney disease Father        PCKD   Cancer Sister        throat   Diabetes Brother    Kidney disease Sister         PCKD   Cancer Other        5/7 nephews with lung cancer    Social History:  reports that he quit smoking about 32 years ago. His smoking use included cigarettes. He has a 25.00 pack-year smoking history. He has never used smokeless tobacco. He reports that he does not drink alcohol and does not use drugs.   Physical Exam: BP (!) 142/78   Pulse 72   Ht 6' (1.829 m)   Wt 215 lb (97.5 kg)   BMI 29.16 kg/m   Constitutional:  Alert and oriented, No acute distress. HEENT: [place] AT, moist mucus membranes.  Trachea midline, no masses. Cardiovascular: No clubbing, cyanosis, or edema. Respiratory: Normal respiratory effort, no increased work of breathing. GU: Prostate 45 g, smooth without nodules   Assessment & Plan:    1.  BPH with LUTS Moderate lower urinary tract symptoms stable on tamsulosin/tolterodine Bladder scan PVR today 69 mL Continue present meds Follow-up 1 year or earlier for any change in symptoms His PSA June 2021 was 0.47.  We discussed current recommendations for prostate cancer screening with PSA testing between the ages of 83-69.  With a low and stable PSA and benign DRE would recommend discontinuing screening and he is in agreement    Abbie Sons, MD  [facility] [address] [place], [place] [address] [phone]

## 2021-04-06 ENCOUNTER — Encounter: Payer: Self-pay | Admitting: Pulmonary Disease

## 2021-04-06 DIAGNOSIS — G4730 Sleep apnea, unspecified: Secondary | ICD-10-CM | POA: Diagnosis not present

## 2021-04-06 DIAGNOSIS — R0683 Snoring: Secondary | ICD-10-CM | POA: Diagnosis not present

## 2021-04-07 DIAGNOSIS — K6289 Other specified diseases of anus and rectum: Secondary | ICD-10-CM | POA: Diagnosis not present

## 2021-04-07 DIAGNOSIS — K5900 Constipation, unspecified: Secondary | ICD-10-CM | POA: Diagnosis not present

## 2021-04-08 DIAGNOSIS — Z7951 Long term (current) use of inhaled steroids: Secondary | ICD-10-CM | POA: Diagnosis not present

## 2021-04-08 DIAGNOSIS — G629 Polyneuropathy, unspecified: Secondary | ICD-10-CM | POA: Diagnosis not present

## 2021-04-08 DIAGNOSIS — J449 Chronic obstructive pulmonary disease, unspecified: Secondary | ICD-10-CM | POA: Diagnosis not present

## 2021-04-08 DIAGNOSIS — I509 Heart failure, unspecified: Secondary | ICD-10-CM | POA: Diagnosis not present

## 2021-04-08 DIAGNOSIS — Z951 Presence of aortocoronary bypass graft: Secondary | ICD-10-CM | POA: Diagnosis not present

## 2021-04-08 DIAGNOSIS — Z7982 Long term (current) use of aspirin: Secondary | ICD-10-CM | POA: Diagnosis not present

## 2021-04-08 DIAGNOSIS — I25708 Atherosclerosis of coronary artery bypass graft(s), unspecified, with other forms of angina pectoris: Secondary | ICD-10-CM | POA: Diagnosis not present

## 2021-04-08 DIAGNOSIS — Z6828 Body mass index (BMI) 28.0-28.9, adult: Secondary | ICD-10-CM | POA: Diagnosis not present

## 2021-04-09 ENCOUNTER — Other Ambulatory Visit: Payer: Self-pay

## 2021-04-09 DIAGNOSIS — H1132 Conjunctival hemorrhage, left eye: Secondary | ICD-10-CM | POA: Diagnosis not present

## 2021-04-09 MED ORDER — TRELEGY ELLIPTA 100-62.5-25 MCG/INH IN AEPB: 1.0000 | INHALATION_SPRAY | Freq: Every day | RESPIRATORY_TRACT | 0 refills | Status: AC

## 2021-04-14 ENCOUNTER — Other Ambulatory Visit: Payer: Self-pay | Admitting: Family Medicine

## 2021-04-16 ENCOUNTER — Other Ambulatory Visit: Payer: Self-pay | Admitting: Cardiovascular Disease

## 2021-04-16 DIAGNOSIS — I739 Peripheral vascular disease, unspecified: Secondary | ICD-10-CM

## 2021-04-20 ENCOUNTER — Emergency Department: Payer: PPO

## 2021-04-20 ENCOUNTER — Telehealth: Payer: Self-pay | Admitting: Family Medicine

## 2021-04-20 ENCOUNTER — Observation Stay
Admission: EM | Admit: 2021-04-20 | Discharge: 2021-04-22 | Disposition: A | Discharge status: Home / Self Care | Payer: PPO | Attending: Internal Medicine | Admitting: Internal Medicine

## 2021-04-20 ENCOUNTER — Other Ambulatory Visit: Payer: Self-pay

## 2021-04-20 ENCOUNTER — Telehealth: Payer: Self-pay | Admitting: Pulmonary Disease

## 2021-04-20 ENCOUNTER — Telehealth: Payer: Self-pay | Admitting: Cardiovascular Disease

## 2021-04-20 DIAGNOSIS — I25110 Atherosclerotic heart disease of native coronary artery with unstable angina pectoris: Principal | ICD-10-CM | POA: Insufficient documentation

## 2021-04-20 DIAGNOSIS — Z87891 Personal history of nicotine dependence: Secondary | ICD-10-CM | POA: Insufficient documentation

## 2021-04-20 DIAGNOSIS — Z79899 Other long term (current) drug therapy: Secondary | ICD-10-CM | POA: Diagnosis not present

## 2021-04-20 DIAGNOSIS — I5020 Unspecified systolic (congestive) heart failure: Secondary | ICD-10-CM | POA: Insufficient documentation

## 2021-04-20 DIAGNOSIS — I10 Essential (primary) hypertension: Secondary | ICD-10-CM

## 2021-04-20 DIAGNOSIS — Z8616 Personal history of COVID-19: Secondary | ICD-10-CM | POA: Insufficient documentation

## 2021-04-20 DIAGNOSIS — N183 Chronic kidney disease, stage 3 unspecified: Secondary | ICD-10-CM | POA: Insufficient documentation

## 2021-04-20 DIAGNOSIS — Z96643 Presence of artificial hip joint, bilateral: Secondary | ICD-10-CM | POA: Diagnosis not present

## 2021-04-20 DIAGNOSIS — R091 Pleurisy: Secondary | ICD-10-CM | POA: Diagnosis not present

## 2021-04-20 DIAGNOSIS — R7303 Prediabetes: Secondary | ICD-10-CM | POA: Diagnosis not present

## 2021-04-20 DIAGNOSIS — Z20822 Contact with and (suspected) exposure to covid-19: Secondary | ICD-10-CM | POA: Diagnosis not present

## 2021-04-20 DIAGNOSIS — E785 Hyperlipidemia, unspecified: Secondary | ICD-10-CM | POA: Diagnosis not present

## 2021-04-20 DIAGNOSIS — I130 Hypertensive heart and chronic kidney disease with heart failure and stage 1 through stage 4 chronic kidney disease, or unspecified chronic kidney disease: Secondary | ICD-10-CM | POA: Insufficient documentation

## 2021-04-20 DIAGNOSIS — Z951 Presence of aortocoronary bypass graft: Secondary | ICD-10-CM | POA: Insufficient documentation

## 2021-04-20 DIAGNOSIS — R0789 Other chest pain: Secondary | ICD-10-CM | POA: Diagnosis not present

## 2021-04-20 DIAGNOSIS — I517 Cardiomegaly: Secondary | ICD-10-CM | POA: Diagnosis not present

## 2021-04-20 DIAGNOSIS — I2510 Atherosclerotic heart disease of native coronary artery without angina pectoris: Secondary | ICD-10-CM

## 2021-04-20 DIAGNOSIS — R079 Chest pain, unspecified: Secondary | ICD-10-CM | POA: Diagnosis not present

## 2021-04-20 DIAGNOSIS — R0781 Pleurodynia: Secondary | ICD-10-CM

## 2021-04-20 DIAGNOSIS — I200 Unstable angina: Secondary | ICD-10-CM | POA: Diagnosis present

## 2021-04-20 DIAGNOSIS — Z7982 Long term (current) use of aspirin: Secondary | ICD-10-CM | POA: Insufficient documentation

## 2021-04-20 DIAGNOSIS — J449 Chronic obstructive pulmonary disease, unspecified: Secondary | ICD-10-CM | POA: Insufficient documentation

## 2021-04-20 DIAGNOSIS — R0600 Dyspnea, unspecified: Secondary | ICD-10-CM | POA: Diagnosis not present

## 2021-04-20 DIAGNOSIS — Z96651 Presence of right artificial knee joint: Secondary | ICD-10-CM | POA: Insufficient documentation

## 2021-04-20 DIAGNOSIS — R0602 Shortness of breath: Secondary | ICD-10-CM | POA: Diagnosis not present

## 2021-04-20 DIAGNOSIS — U071 COVID-19: Secondary | ICD-10-CM | POA: Diagnosis not present

## 2021-04-20 LAB — CBC
HCT: 38.7 % — ABNORMAL LOW (ref 39.0–52.0)
Hemoglobin: 14.1 g/dL (ref 13.0–17.0)
MCH: 33.8 pg (ref 26.0–34.0)
MCHC: 36.4 g/dL — ABNORMAL HIGH (ref 30.0–36.0)
MCV: 92.8 fL (ref 80.0–100.0)
Platelets: 161 10*3/uL (ref 150–400)
RBC: 4.17 MIL/uL — ABNORMAL LOW (ref 4.22–5.81)
RDW: 11.9 % (ref 11.5–15.5)
WBC: 6.1 10*3/uL (ref 4.0–10.5)
nRBC: 0 % (ref 0.0–0.2)

## 2021-04-20 LAB — BRAIN NATRIURETIC PEPTIDE: B Natriuretic Peptide: 71.3 pg/mL (ref 0.0–100.0)

## 2021-04-20 LAB — BASIC METABOLIC PANEL
Anion gap: 6 (ref 5–15)
BUN: 37 mg/dL — ABNORMAL HIGH (ref 8–23)
CO2: 27 mmol/L (ref 22–32)
Calcium: 9 mg/dL (ref 8.9–10.3)
Chloride: 103 mmol/L (ref 98–111)
Creatinine, Ser: 1.41 mg/dL — ABNORMAL HIGH (ref 0.61–1.24)
GFR, Estimated: 50 mL/min — ABNORMAL LOW (ref >60)
Glucose, Bld: 152 mg/dL — ABNORMAL HIGH (ref 70–99)
Potassium: 4.1 mmol/L (ref 3.5–5.1)
Sodium: 136 mmol/L (ref 135–145)

## 2021-04-20 LAB — TROPONIN I (HIGH SENSITIVITY)
Troponin I (High Sensitivity): 7 ng/L (ref <18)
Troponin I (High Sensitivity): 7 ng/L (ref <18)

## 2021-04-20 MED ORDER — HEPARIN SODIUM (PORCINE) 5000 UNIT/ML IJ SOLN: 4000.0000 [IU] | Freq: Once | INTRAMUSCULAR | Status: DC

## 2021-04-20 MED ORDER — FLUTICASONE PROPIONATE 50 MCG/ACT NA SUSP
2.0000 | NASAL | Status: DC | PRN
  Filled 2021-04-20: qty 16

## 2021-04-20 MED ORDER — ASPIRIN 325 MG PO TABS
325.0000 mg | ORAL_TABLET | Freq: Every day | ORAL | Status: DC
  Filled 2021-04-20: qty 1

## 2021-04-20 MED ORDER — DIPHENHYDRAMINE HCL 25 MG PO CAPS
50.0000 mg | ORAL_CAPSULE | Freq: Once | ORAL | Status: AC
Start: 2021-04-20 — End: 2021-04-20

## 2021-04-20 MED ORDER — MAGNESIUM HYDROXIDE 400 MG/5ML PO SUSP: 30.0000 mL | Freq: Every day | ORAL | Status: DC | PRN

## 2021-04-20 MED ORDER — ACETAMINOPHEN 650 MG RE SUPP: 650.0000 mg | Freq: Four times a day (QID) | RECTAL | Status: DC | PRN

## 2021-04-20 MED ORDER — HEPARIN (PORCINE) 25000 UT/250ML-% IV SOLN
1300.0000 [IU]/h | INTRAVENOUS | Status: DC
  Administered 2021-04-20 – 2021-04-21 (×2): 1300 [IU]/h via INTRAVENOUS

## 2021-04-20 MED ORDER — TAMSULOSIN HCL 0.4 MG PO CAPS
0.4000 mg | ORAL_CAPSULE | Freq: Every day | ORAL | Status: DC
  Administered 2021-04-21: 0.4 mg via ORAL
  Filled 2021-04-20 (×2): qty 1

## 2021-04-20 MED ORDER — PANTOPRAZOLE SODIUM 40 MG PO TBEC: 40.0000 mg | DELAYED_RELEASE_TABLET | Freq: Every day | ORAL | Status: DC

## 2021-04-20 MED ORDER — ASPIRIN EC 81 MG PO TBEC
81.0000 mg | DELAYED_RELEASE_TABLET | Freq: Every day | ORAL | Status: DC
  Administered 2021-04-21 – 2021-04-22 (×2): 81 mg via ORAL
  Filled 2021-04-20 (×2): qty 1

## 2021-04-20 MED ORDER — PANTOPRAZOLE SODIUM 40 MG PO TBEC
40.0000 mg | DELAYED_RELEASE_TABLET | Freq: Every day | ORAL | Status: DC
  Administered 2021-04-20 – 2021-04-22 (×3): 40 mg via ORAL
  Filled 2021-04-20 (×4): qty 1

## 2021-04-20 MED ORDER — ACETAMINOPHEN 325 MG PO TABS
650.0000 mg | ORAL_TABLET | Freq: Four times a day (QID) | ORAL | Status: DC | PRN
  Administered 2021-04-21 (×2): 650 mg via ORAL
  Filled 2021-04-20 (×2): qty 2

## 2021-04-20 MED ORDER — ONDANSETRON HCL 4 MG/2ML IJ SOLN: 4.0000 mg | Freq: Four times a day (QID) | INTRAMUSCULAR | Status: DC | PRN

## 2021-04-20 MED ORDER — TRAZODONE HCL 50 MG PO TABS: 25.0000 mg | ORAL_TABLET | Freq: Every evening | ORAL | Status: DC | PRN

## 2021-04-20 MED ORDER — SIMVASTATIN 10 MG PO TABS
40.0000 mg | ORAL_TABLET | Freq: Every day | ORAL | Status: DC
  Administered 2021-04-21: 40 mg via ORAL
  Filled 2021-04-20: qty 4

## 2021-04-20 MED ORDER — RANOLAZINE ER 500 MG PO TB12
500.0000 mg | ORAL_TABLET | Freq: Two times a day (BID) | ORAL | Status: DC
  Administered 2021-04-21 – 2021-04-22 (×3): 500 mg via ORAL
  Filled 2021-04-20 (×6): qty 1

## 2021-04-20 MED ORDER — GABAPENTIN 300 MG PO CAPS
300.0000 mg | ORAL_CAPSULE | Freq: Three times a day (TID) | ORAL | Status: DC
  Administered 2021-04-21 – 2021-04-22 (×4): 300 mg via ORAL
  Filled 2021-04-20 (×5): qty 1

## 2021-04-20 MED ORDER — DOCUSATE SODIUM 100 MG PO CAPS: 100.0000 mg | ORAL_CAPSULE | Freq: Two times a day (BID) | ORAL | Status: DC | PRN

## 2021-04-20 MED ORDER — EZETIMIBE 10 MG PO TABS
10.0000 mg | ORAL_TABLET | Freq: Every day | ORAL | Status: DC
  Administered 2021-04-21: 10 mg via ORAL
  Filled 2021-04-20 (×2): qty 1

## 2021-04-20 MED ORDER — SODIUM CHLORIDE 0.9 % IV BOLUS
500.0000 mL | Freq: Once | INTRAVENOUS | Status: AC
Start: 2021-04-20 — End: 2021-04-20
  Administered 2021-04-20: 500 mL via INTRAVENOUS

## 2021-04-20 MED ORDER — ALBUTEROL SULFATE HFA 108 (90 BASE) MCG/ACT IN AERS: 2.0000 | INHALATION_SPRAY | RESPIRATORY_TRACT | Status: DC | PRN

## 2021-04-20 MED ORDER — DIPHENHYDRAMINE HCL 50 MG/ML IJ SOLN
50.0000 mg | Freq: Once | INTRAMUSCULAR | Status: AC
Start: 2021-04-20 — End: 2021-04-20
  Administered 2021-04-20: 50 mg via INTRAVENOUS
  Filled 2021-04-20: qty 1

## 2021-04-20 MED ORDER — VITAMIN B-12 1000 MCG PO TABS
1000.0000 ug | ORAL_TABLET | Freq: Every day | ORAL | Status: DC
  Administered 2021-04-21 – 2021-04-22 (×2): 1000 ug via ORAL
  Filled 2021-04-20 (×2): qty 1

## 2021-04-20 MED ORDER — ISOSORBIDE MONONITRATE ER 60 MG PO TB24
30.0000 mg | ORAL_TABLET | Freq: Every day | ORAL | Status: DC
  Filled 2021-04-20: qty 1

## 2021-04-20 MED ORDER — HEPARIN BOLUS VIA INFUSION
4000.0000 [IU] | Freq: Once | INTRAVENOUS | Status: AC
  Administered 2021-04-20: 4000 [IU] via INTRAVENOUS
  Filled 2021-04-20: qty 4000

## 2021-04-20 MED ORDER — FUROSEMIDE 40 MG PO TABS
20.0000 mg | ORAL_TABLET | ORAL | Status: DC
  Filled 2021-04-20: qty 1

## 2021-04-20 MED ORDER — METOPROLOL SUCCINATE ER 25 MG PO TB24
12.5000 mg | ORAL_TABLET | Freq: Every day | ORAL | Status: DC
  Administered 2021-04-21: 12.5 mg via ORAL
  Filled 2021-04-20: qty 1

## 2021-04-20 MED ORDER — NITROGLYCERIN 2 % TD OINT
0.5000 [in_us] | TOPICAL_OINTMENT | Freq: Once | TRANSDERMAL | Status: AC
  Administered 2021-04-20: 0.5 [in_us] via TOPICAL
  Filled 2021-04-20: qty 1

## 2021-04-20 MED ORDER — CYCLOBENZAPRINE HCL 10 MG PO TABS: 10.0000 mg | ORAL_TABLET | Freq: Three times a day (TID) | ORAL | Status: DC | PRN

## 2021-04-20 MED ORDER — HYDROCORTISONE NA SUCCINATE PF 250 MG IJ SOLR
200.0000 mg | Freq: Once | INTRAMUSCULAR | Status: AC
Start: 2021-04-20 — End: 2021-04-20
  Administered 2021-04-20: 200 mg via INTRAVENOUS
  Filled 2021-04-20: qty 200

## 2021-04-20 MED ORDER — LOSARTAN POTASSIUM 50 MG PO TABS
25.0000 mg | ORAL_TABLET | Freq: Every day | ORAL | Status: DC
  Administered 2021-04-21 – 2021-04-22 (×2): 25 mg via ORAL
  Filled 2021-04-20 (×2): qty 1

## 2021-04-20 MED ORDER — FAMOTIDINE 20 MG PO TABS
40.0000 mg | ORAL_TABLET | Freq: Once | ORAL | Status: AC
  Administered 2021-04-20: 40 mg via ORAL
  Filled 2021-04-20: qty 2

## 2021-04-20 MED ORDER — FESOTERODINE FUMARATE ER 4 MG PO TB24
4.0000 mg | ORAL_TABLET | Freq: Every day | ORAL | Status: DC
  Administered 2021-04-21 – 2021-04-22 (×2): 4 mg via ORAL
  Filled 2021-04-20 (×2): qty 1

## 2021-04-20 MED ORDER — ONDANSETRON HCL 4 MG PO TABS: 4.0000 mg | ORAL_TABLET | Freq: Four times a day (QID) | ORAL | Status: DC | PRN

## 2021-04-20 MED ORDER — IOHEXOL 350 MG/ML SOLN
75.0000 mL | Freq: Once | INTRAVENOUS | Status: AC | PRN
  Administered 2021-04-20: 75 mL via INTRAVENOUS
  Filled 2021-04-20: qty 75

## 2021-04-20 MED ORDER — SODIUM CHLORIDE 0.9 % IV SOLN: INTRAVENOUS | Status: DC

## 2021-04-20 MED ORDER — POLYETHYLENE GLYCOL 3350 17 G PO PACK: 17.0000 g | PACK | Freq: Every day | ORAL | Status: DC | PRN

## 2021-04-20 MED ORDER — NITROGLYCERIN 0.4 MG SL SUBL: 0.4000 mg | SUBLINGUAL_TABLET | SUBLINGUAL | Status: DC | PRN

## 2021-04-20 MED ORDER — SODIUM CHLORIDE 0.9 % IV BOLUS
500.0000 mL | Freq: Once | INTRAVENOUS | Status: AC
  Administered 2021-04-20: 500 mL via INTRAVENOUS

## 2021-04-20 NOTE — ED Triage Notes (Signed)
Pt c/o intermittent chest pain with with increased SOB over the past 2 weeks. States he called her PCP and was referred to the ED for possible PE.. pt is ambulatory to triage , in NAD on arrival

## 2021-04-20 NOTE — ED Notes (Addendum)
Pt presents to the ED for SOB and chest tightness for the past 3 weeks. Pt states that he is SOB on exertion which is not normal for him. Pt has a hx of CHF. Pt was trying to be seen at his PCP and they told him to come here for a possible PE. Denies any other significant cardiac hx. Pt is A&Ox5 and NAD.

## 2021-04-20 NOTE — H&P (Signed)
[place]   PATIENT NAME: [name]    MR#:  [id number]  DATE OF BIRTH:  11/04/1940  DATE OF ADMISSION:  04/20/2021  PRIMARY CARE PHYSICIAN: Ria Bush, MD   Patient is coming from: Home  REQUESTING/REFERRING PHYSICIAN: Vallarie Mare, PA-C  CHIEF COMPLAINT:   Chief Complaint  Patient presents with   Chest Pain   Shortness of Breath    HISTORY OF PRESENT ILLNESS:  [name] is a 80 y.o. male with medical history significant for coronary artery disease status post three-vessel CABG, status post PCI and stent, CHF, DVT, hypertension, dyslipidemia and obstructive sleep apnea, who presented to the ER with acute onset of exertional chest pain which has been going on over the last couple weeks with associated chest discomfort in the midsternal area.  He denied any radiation or nausea or vomiting or diaphoresis associated with his symptoms.  No cough or wheezing or hemoptysis.  He denies any leg pain or edema or recent travels or surgeries.  No bleeding diathesis.  No dysuria, oliguria or hematuria or flank pain.  He was advised to come to the ER by his cardiologist today when he contacted them.  ED Course: When he came to the ER blood pressure was 150/69 with otherwise normal vital signs.  Labs revealed a blood glucose of 152 with BUN of 37 creatinine 1.41 baseline, BNP of 71.3 and high-sensitivity troponin 7 and later the same.  CBC was unremarkable.  Influenza antigens and COVID-19 PCR came back negative. EKG as reviewed by me : EKG showed normal sinus rhythm with a rate of 79 with low voltage QRS and left anterior fascicular block.  Repeat EKG later on showed normal sinus rhythm with a rate of 83 with left anterior fascicular block with mild ST segment depression in V5 and V6 with T wave inversion laterally.. Imaging: 2 view chest x-ray showed cardiomegaly without CHF and no acute cardiopulmonary disease.  The patient was given 325 mg p.o. aspirin, and nitro  Nitropaste and IV normal saline bolus 500 mL twice with 200 mg IV Solu-Cortef and 50 mg of IV Benadryl and 40 mg p.o. Pepcid given history of IV contrast allergy.  After consultation with cardiology the patient was started on IV heparin with bolus and drip.  He will be admitted to an observation progressive unit bed for further evaluation and management. 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 06/2019 Echo (in setting of COVID): EF 35-40%, mild LVH, g1 DD, glob HK; b. 09/2019 Echo: eF 50-55%, no rwma, Gr1 DD, nl RV size/fxn.   Actinic keratosis    Anginal pain ([place])    BENIGN PROSTATIC HYPERTROPHY, WITH URINARY OBSTRUCTION 09/06/2007   CAD s/p CABG    a. 1990 s/p MI-->CABG x 2; b. 2002 s/p BMS to LCX; c. 06/2015 Cath: LM 50ost, LAD 100ost/p, 21md, D2 nl, LCX  patent stent, RCA 80p (small), LIMA->LAD nl, VG->D2 nl-->Med Rx; d. 05/2020 MV: fixed apical ant/apical defect. No signif ischemia.   Carotid arterial disease ([place]    a. 08/2016 Carotid U/S: <39% bilat.   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 did not tolerate CPAP.   Osteoarthritis    PAD (peripheral artery disease) ([place])    a. 07/2017 LE duplex: RSFA 75-41m LSFA [id number]; c. 08/2018 Periph Angio: No signif AoIliac dzs. Mod-sev Ca2+ RSFA  w/ diff dzs throughout- 3 vessel runoff. Borderline signif LSFA dzs w/ mod-sev Ca2+ vessels and 3 vessel runoff below the knee-->Med rx.   Pneumonia 07/2014   [facility] hospitalization   Pneumonia due to COVID-19 virus 06/02/2019   Pre-diabetes    PSA, INCREASED 07/09/2008   Spinal stenosis of lumbar region     PAST SURGICAL HISTORY:   Past Surgical History:  Procedure Laterality Date   ABDOMINAL AORTOGRAM W/LOWER EXTREMITY [name] 09/12/2018   Procedure: ABDOMINAL AORTOGRAM W/LOWER EXTREMITY;  Surgeon: ArWellington HampshireMD;  [facility];  Service: Cardiovascular;  Laterality: [name];   APPENDECTOMY     rupture   BACK SURGERY     1984 and [facility]   CARDIAC CATHETERIZATION [name] 07/07/2015   Procedure: Left Heart Cath and Cors/Grafts Angiography;  Surgeon: TiMinna MerrittsMD;  [facility];  Service: Cardiovascular;  Laterality: [name];   CATARACT EXTRACTION, BILATERAL     CERVICAL SPINE SURGERY  12/2016   cervical stenosis ([name]  COLONOSCOPY  03/2010   HP polyp, diverticulosis, rpt 10 yrs ([name])   CORONARY ARTERY BYPASS GRAFT  1990   3 vessel    CYSTOSCOPY  12/23/10   [facility]   EYE SURGERY     cataract   JOINT REPLACEMENT     KNEE ARTHROSCOPY Right    x2   LAMINOTOMY  1986   L5/S1 lumbar laminotomy for two ruptured discs/fusion   LUMBAR LAMINECTOMY/DECOMPRESSION MICRODISCECTOMY [name] 07/13/2016   Procedure: LUMBAR TWO-THREE, LUMBAR THREE-FOUR, LUMBAR FOUR-FIVE LAMINECTOMY AND FORAMINOTOMY;  Surgeon: JeNewman PiesMD;  [name] Service: Neurosurgery;  Laterality: [name];  LAMINECTOMY AND FORAMINOTOMY L2-L3, L3-L4,L4-L5   LUMBAR SPINE SURGERY  06/2020   Bilateral redo laminectomy/laminotomy/foraminotomies/medial facetectomy to decompress the bilateral L4 and L5 nerve roots (JArnoldo [name]  TOTAL HIP ARTHROPLASTY Bilateral [id number] TOTAL KNEE ARTHROPLASTY Right 03/06/2017   Procedure: RIGHT TOTAL KNEE ARTHROPLASTY;  Surgeon: AlGaynelle ArabianMD;  Location: [facility];  Service: Orthopedics;  Laterality: Right;    SOCIAL HISTORY:   Social History   Tobacco Use   Smoking status: Former    Packs/day: 1.00    Years: 25.00    Pack years: 25.00    Types: Cigarettes    Quit date: 08/15/1988    Years since quitting: 32.7   Smokeless tobacco: Never  Substance Use Topics   Alcohol use: No    Alcohol/week: 0.0 standard drinks    FAMILY HISTORY:   Family History  Problem Relation Age of Onset   Stroke Mother    Heart attack Mother    Diabetes Mother    Stroke Father    Heart disease Father    Kidney disease Father        PCKD   Cancer  Sister        throat   Diabetes Brother    Kidney disease Sister        PCKD   Cancer Other        5/7 nephews with lung cancer    DRUG ALLERGIES:   Allergies  Allergen Reactions   Vioxx [Rofecoxib] Other (See Comments)    Hemorrhage    Spiriva Respimat [Tiotropium Bromide Monohydrate] Other (See Comments)    Elevated bp   Contrast Media [Iodinated Diagnostic Agents] Itching and Rash    Delayed reaction post abdominal aortagram.    Morphine Nausea Only and Other (See Comments)    Irritability     REVIEW OF SYSTEMS:   ROS As per history of present illness. All pertinent systems were reviewed above. Constitutional, HEENT, cardiovascular, respiratory, GI, GU, musculoskeletal, neuro, psychiatric, endocrine, integumentary and hematologic systems were reviewed and are otherwise negative/unremarkable except for positive findings mentioned above in the HPI.   MEDICATIONS AT HOME:   Prior to Admission medications   Medication Sig Start Date End Date Taking? Authorizing Provider  acetaminophen (TYLENOL) 500 MG tablet Take 500 mg by mouth every 8 (eight) hours as needed.    [provider]  aspirin EC 81 MG EC tablet Take 1 tablet (81 mg total) by mouth daily. 06/26/19   Elgergawy, Silver Huguenin, MD  Cyanocobalamin (B-12) 1000 MCG SUBL Place 1 tablet under the tongue daily.  12/07/18   Ria Bush, MD  cyclobenzaprine (FLEXERIL) 10 MG tablet Take 1 tablet (10 mg total) by mouth 3 (three) times daily as needed for muscle spasms. 07/07/20   [name], MD  docusate sodium (COLACE) 100 MG capsule Take 100 mg by mouth 2 (two) times daily as needed for mild constipation.    [provider]  ezetimibe (ZETIA) 10 MG tablet Take 1 tablet (10 mg total) by mouth daily. 03/19/21   [name], MD  fluticasone (FLONASE) 50 MCG/ACT nasal spray Place into both nostrils as needed for allergies or rhinitis.    [provider]  furosemide (LASIX) 20 MG tablet Take 1 tablet (20 mg total) by mouth every other day. 03/24/21   [name], MD  gabapentin (NEURONTIN) 300 MG capsule TAKE 1 CAPSULE BY MOUTH THREE TIMES DAILY 01/21/21   Ria Bush, MD  hydrocortisone 2.5 % lotion Apply topically daily as needed.    [provider]  isosorbide mononitrate (IMDUR) 30 MG 24 hr tablet Take 1 tablet (30 mg total) by mouth at bedtime. 03/19/21   [name], MD  losartan (COZAAR) 25 MG tablet Take 1 tablet (25 mg total) by mouth daily. 03/19/21   [name], MD  metoprolol succinate (TOPROL-XL) 25 MG 24 hr tablet Take 0.5 tablets (12.5 mg total) by mouth daily. 03/19/21   [name], MD  nitroGLYCERIN (NITROSTAT) 0.4 MG SL tablet Place 1 tablet (0.4 mg total) under the tongue every 5 (five) minutes as needed for chest pain. 03/19/21   [name], MD  omeprazole (PRILOSEC) 20 MG capsule TAKE 1 CAPSULE EVERY DAY 12/29/20   Tyler Pita, MD  polyethylene glycol (MIRALAX / GLYCOLAX) 17 g packet Take 17 g by mouth daily as needed for mild constipation.     [provider]  PROAIR HFA 108 [phone] Base) MCG/ACT inhaler TAKE 2 PUFFS INTO LUNGS EVERY 6 HOURS ASNEEDED FOR WHEEZING OR SHORTNESS OF BREATH 02/19/21   Ria Bush, MD  ranolazine (RANEXA) 500 MG 12 hr tablet  Take 1 tablet (500 mg total) by mouth 2 (two) times daily.  03/19/21   [name], MD  simvastatin (ZOCOR) 40 MG tablet Take 1 tablet (40 mg total) by mouth daily. 03/19/21   [name], MD  tamsulosin (FLOMAX) 0.4 MG CAPS capsule Take 0.4 mg by mouth at bedtime. 05/21/20   [provider]  tolterodine (DETROL LA) 4 MG 24 hr capsule Take 4 mg by mouth daily.    [provider]      VITAL SIGNS:  Blood pressure (!) 143/58, pulse 80, temperature 98.4 F (36.9 C), resp. rate (!) 25, height 6' (1.829 m), weight 97.1 kg, SpO2 100 %.  PHYSICAL EXAMINATION:  Physical Exam  GENERAL:  80 y.o.-year-old Caucasian male patient lying in the bed with no acute distress.  EYES: Pupils equal, round, reactive to light and accommodation. No scleral icterus. Extraocular muscles intact.  HEENT: Head atraumatic, normocephalic. Oropharynx and nasopharynx clear.  NECK:  Supple, no jugular venous distention. No thyroid enlargement, no tenderness.  LUNGS: Normal breath sounds bilaterally, no wheezing, rales,rhonchi or crepitation. No use of accessory muscles of respiration.  CARDIOVASCULAR: Regular rate and rhythm, S1, S2 normal. No murmurs, rubs, or gallops.  ABDOMEN: Soft, nondistended, nontender. Bowel sounds present. No organomegaly or mass.  EXTREMITIES: No pedal edema, cyanosis, or clubbing.  NEUROLOGIC: Cranial nerves II through XII are intact. Muscle strength 5/5 in all extremities. Sensation intact. Gait not checked.  PSYCHIATRIC: The patient is alert and oriented x 3.  Normal affect and good eye contact. SKIN: No obvious rash, lesion, or ulcer.   LABORATORY PANEL:   CBC Recent Labs  Lab 04/20/21 1312  WBC 6.1  HGB 14.1  HCT 38.7*  PLT 161   ------------------------------------------------------------------------------------------------------------------  Chemistries  Recent Labs  Lab 04/20/21 1312  NA 136  K 4.1  CL 103  CO2 27  GLUCOSE 152*  BUN 37*  CREATININE 1.41*  CALCIUM 9.0    ------------------------------------------------------------------------------------------------------------------  Cardiac Enzymes No results for input(s): TROPONINI in the last 168 hours. ------------------------------------------------------------------------------------------------------------------  RADIOLOGY:  DG Chest 2 View  Result Date: 04/20/2021 CLINICAL DATA:  Chest pain with shortness of breath over the past 2 weeks. EXAM: CHEST - 2 VIEW COMPARISON:  07/16/2020 FINDINGS: Prior median sternotomy. Cervical spine fixation. Midline trachea. Mild cardiomegaly. Biapical pleural thickening. No pleural effusion or pneumothorax. Left lower lobe scarring is mild, similar to on the prior. There may also be scarring within the lateral right upper lobe. No congestive failure. No lobar consolidation. IMPRESSION: 1. No acute cardiopulmonary disease. 2. Cardiomegaly without congestive failure. Electronically Signed   By: Abigail Miyamoto M.D.   On: 04/20/2021 13:58   CT Angio Chest PE W and/or Wo Contrast  Result Date: 04/20/2021 CLINICAL DATA:  PE suspected, high prob. Intermittent chest pain with increasing dyspnea EXAM: CT ANGIOGRAPHY CHEST WITH CONTRAST TECHNIQUE: Multidetector CT imaging of the chest was performed using the standard protocol during bolus administration of intravenous contrast. Multiplanar CT image reconstructions and MIPs were obtained to evaluate the vascular anatomy. CONTRAST:  32m OMNIPAQUE IOHEXOL 350 MG/ML SOLN COMPARISON:  12/18/2019 FINDINGS: Cardiovascular: There is adequate opacification of the pulmonary arterial tree. No intraluminal filling defect identified to suggest acute pulmonary embolism through the segmental level. The central pulmonary arteries are of normal caliber. Coronary artery bypass grafting has been performed. Extensive calcification is seen within the native coronary arteries. Global cardiac size is within normal limits. No pericardial effusion. The thoracic  aorta is of normal caliber and  demonstrates moderate mixed atherosclerotic plaque. There is mild plaque ulceration noted involving atheromatous plaque along the ventral surface of the aortic arch, best appreciated on axial image # 34/4 and sagittal reformat # 76/8. There is no mural penetration noted. No periaortic inflammatory change. Mediastinum/Nodes: No enlarged mediastinal, hilar, or axillary lymph nodes. Thyroid gland, trachea, and esophagus demonstrate no significant findings. Lungs/Pleura: Mild emphysema. Bibasilar parenchymal scarring appears stable since prior examination, likely post inflammatory in nature. No pneumothorax or pleural effusion. No superimposed focal pulmonary infiltrate. The central airways are widely patent. Upper Abdomen: Prominent atheromatous plaque is seen within the abdominal aorta at the level of the diaphragmatic hiatus. No acute abnormality. Musculoskeletal: No acute bone abnormality. No lytic or blastic bone lesion. Review of the MIP images confirms the above findings. IMPRESSION: No pulmonary embolism.  No acute intrathoracic pathology identified. Mild emphysema. Stable bibasilar parenchymal scarring, likely post inflammatory in nature. Peripheral vascular disease with ulcerated atheromatous plaque within the aortic arch and prominent coral-like atheromatous plaque within the abdominal aorta at the level of the diaphragmatic hiatus. Clinical correlation for evidence of peripheral thromboembolism (blue toe syndrome, etc.) may be helpful for further management. Aortic Atherosclerosis (ICD10-I70.0) and Emphysema (ICD10-J43.9). Electronically Signed   By: Fidela Salisbury M.D.   On: 04/20/2021 20:23      IMPRESSION AND PLAN:  Active Problems:   Unstable angina ([place])  1.  Intermittent dyspnea on exertion chest pain concerning for unstable angina.  The patient has a history of coronary artery disease status post three-vessel CABG, status post PCI and stent. - The patient will  be admitted to an observation progressive unit bed. - We will continue IV heparin. - He will be continued on aspirin as well as beta-blocker therapy and statin therapy as well as Ranexa.. - He was given an inch of Nitropaste.  We will continue his Imdur. - We will check fasting lipids in AM. - Cardiology consult will be obtained. - I notified Dr. Curt Bears about the patient.  2.  Dyslipidemia. - We will continue statin therapy and Zetia.  3.  BPH. - We will continue Flomax.  4.  GERD. - We will continue PPI therapy.  5.  Overactive bladder. - We will continue Detrol LA.  6.  Essential hypertension. - We will continue Cozaar, Toprol-XL and Imdur.  DVT prophylaxis: IV heparin.   Code Status: full code.  This was discussed with him. Family Communication:  The plan of care was discussed in details with the patient (and family). I answered all questions. The patient agreed to proceed with the above mentioned plan. Further management will depend upon hospital course. Disposition Plan: Back to previous home environment Consults called: Cardiology. All the records are reviewed and case discussed with ED provider.  Status is: Observation  The patient remains OBS appropriate and will d/c before 2 midnights.  Dispo: The patient is from: Home              Anticipated d/c is to: Home              Patient currently is not medically stable to d/c.   Difficult to place patient No   TOTAL TIME TAKING CARE OF THIS PATIENT: 55 minutes.    Christel Mormon M.D on 04/20/2021 at 11:27 PM  [facility]   From 7 PM-7 AM, contact night-coverage [email or web address]  CC: Primary care physician; Ria Bush, MD

## 2021-04-20 NOTE — Telephone Encounter (Signed)
Noted.  Please see duplicate phone note dated as 04/20/2021.

## 2021-04-20 NOTE — ED Provider Notes (Signed)
  Physical Exam  BP (!) 143/58   Pulse 80   Temp 98.4 F (36.9 C)   Resp (!) 25   Ht 6' (1.829 m)   Wt 97.1 kg   SpO2 100%   BMI 29.02 kg/m   Physical Exam  ED Course/Procedures   Clinical Course as of 04/20/21 2308  Tue Apr 20, 2021  2053 BUN(!): 37 [JW]    Clinical Course User Index [JW] Vallarie Mare M, [place]    .Critical Care  Date/Time: 04/20/2021 11:11 PM Performed by: Lannie Fields, PA-C Authorized by: Lannie Fields, PA-C   Critical care provider statement:    Critical care time (minutes):  60   Critical care was necessary to treat or prevent imminent or life-threatening deterioration of the following conditions:  Cardiac failure   Critical care was time spent personally by me on the following activities:  Blood draw for specimens, development of treatment plan with patient or surrogate, discussions with consultants, evaluation of patient's response to treatment and examination of patient   Care discussed with: admitting provider    MDM   CTA showed no signs of PE.  Assumed patient care for Charlsie Merles, NP.  Upon recheck, patient endorsed midsternal chest pain.  EKG indicated ST segment depression in V5, V4, and V6.  I reached out to cardiologist on-call with [facility], Dr. Kalman Shan who recommended heparin, nitro and aspirin as well as admission.  Discussed patient case with attending, Dr. Jacqualine Code who agrees with plan of care.  Patient was accepted to the hospital service under the care of Dr. Sidney Ace.           Vallarie Mare [place], PA-C 04/20/21 2313    Delman Kitten, MD 04/21/21 [phone]

## 2021-04-20 NOTE — Progress Notes (Signed)
[facility] for heparin infusion Indication: ACS/STEMI  Allergies  Allergen Reactions   Vioxx [Rofecoxib] Other (See Comments)    Hemorrhage    Spiriva Respimat [Tiotropium Bromide Monohydrate] Other (See Comments)    Elevated bp   Contrast Media [Iodinated Diagnostic Agents] Itching and Rash    Delayed reaction post abdominal aortagram.    Morphine Nausea Only and Other (See Comments)    Irritability     Patient Measurements: Height: 6' (182.9 cm) Weight: 97.1 kg (214 lb) IBW/kg (Calculated) : 77.6 Heparin Dosing Weight: 97 kg  Vital Signs: Temp: 98.4 F (36.9 C) (09/06 1312) Temp Source: Oral (09/06 1309) BP: 139/52 (09/06 2130) Pulse Rate: 80 (09/06 2130)  Labs: Recent Labs    04/20/21 1312 04/20/21 1611  HGB 14.1  --   HCT 38.7*  --   PLT 161  --   CREATININE 1.41*  --   TROPONINIHS 7 7    Estimated Creatinine Clearance: 50.5 mL/min (A) (by C-G formula based on SCr of 1.41 mg/dL (H)).   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 06/2019 Echo (in setting of COVID): EF 35-40%, mild LVH, g1 DD, glob HK; b. 09/2019 Echo: eF 50-55%, no rwma, Gr1 DD, nl RV size/fxn.   Actinic keratosis    Anginal pain ([place])    BENIGN PROSTATIC HYPERTROPHY, WITH URINARY OBSTRUCTION 09/06/2007   CAD s/p CABG    a. 1990 s/p MI-->CABG x 2; b. 2002 s/p BMS to LCX; c. 06/2015 Cath: LM 50ost, LAD 100ost/p, 30md, D2 nl, LCX  patent stent, RCA 80p (small), LIMA->LAD nl, VG->D2 nl-->Med Rx; d. 05/2020 MV: fixed apical ant/apical defect. No signif ischemia.   Carotid arterial disease ([place]    a. 08/2016 Carotid U/S: <39% bilat.   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 did not tolerate CPAP.   Osteoarthritis    PAD (peripheral artery disease) ([place]    a. 07/2017 LE duplex: RSFA 75-932mLSFA 75-9951m0-74d; c. 08/2018 Periph Angio: No signif AoIliac dzs. Mod-sev Ca2+ RSFA w/ diff dzs throughout- 3 vessel runoff. Borderline signif LSFA dzs w/ mod-sev Ca2+ vessels and 3 vessel runoff below the knee-->Med rx.   Pneumonia 07/2014   [facility] hospitalization   Pneumonia due to COVID-19 virus 06/02/2019   Pre-diabetes    PSA, INCREASED 07/09/2008   Spinal stenosis of lumbar region     Assessment:  Pt is [age] male with h/o CHF presenting to ED c/o SOB and chest tightness x 3 wks.    Goal of Therapy:  Heparin level 0.3-0.7 units/ml Monitor platelets by anticoagulation protocol: Yes   Plan:  Bolus 4000 units x 1 Start heparin infusion at 1300 units/hr  Check HL 8 hr after start of infusion CBC daily while on heparin  Renda Rolls, PharmD, [facility] 04/20/2021 10:30 PM

## 2021-04-20 NOTE — ED Provider Notes (Signed)
[facility] Emergency Department Provider Note ____________________________________________   Event Date/Time   First MD Initiated Contact with Patient 04/20/21 1501     (approximate)  I have reviewed the triage vital signs and the nursing notes.   HISTORY  Chief Complaint Chest Pain and Shortness of Breath  HPI [name] is a 80 y.o. male with history of CHF, DVT, hyperlipidemia, hypertension, CAD, MI and remaining history as listed below presents to the emergency department for treatment and evaluation of chest pain and shortness of breath that has been present for the past couple weeks.  He was in contact with cardiology him pulmonology today and both referred him to the emergency department to rule out PE. Not currently anticoagulated.         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 06/2019 Echo (in setting of COVID): EF 35-40%, mild LVH, g1 DD, glob HK; b. 09/2019 Echo: eF 50-55%, no rwma, Gr1 DD, nl RV size/fxn.   Actinic keratosis    Anginal pain ([place])    BENIGN PROSTATIC HYPERTROPHY, WITH URINARY OBSTRUCTION 09/06/2007   CAD s/p CABG    a. 1990 s/p MI-->CABG x 2; b. 2002 s/p BMS to LCX; c. 06/2015 Cath: LM 50ost, LAD 100ost/p, 55md, D2 nl, LCX  patent stent, RCA 80p (small), LIMA->LAD nl, VG->D2 nl-->Med Rx; d. 05/2020 MV: fixed apical ant/apical defect. No signif ischemia.   Carotid arterial disease ([place]    a. 08/2016 Carotid U/S: <39% bilat.   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 did not tolerate CPAP.   Osteoarthritis    PAD (peripheral artery disease) ([place]    a. 07/2017 LE duplex: RSFA [id number] [id number]; c. 08/2018 Periph Angio: No signif AoIliac dzs. Mod-sev Ca2+ RSFA w/ diff dzs throughout- 3 vessel runoff. Borderline signif LSFA dzs w/ mod-sev Ca2+ vessels and 3 vessel runoff below the knee-->Med rx.   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Vol[place]1/23/2020   Anxiety 06/04/2019   Chronic right shoulder pain 09/10/2018   Fall 03/08/2018   Cervical stenosis of spinal canal 06/05/2017   Pedal edema 06/05/2017   Health maintenance examination 12/06/2016   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/13/2016   Overweight (BMI 25.0-29.9)  07/04/2016   Low vitamin B12 level 01/01/2016   Exertional dyspnea 07/07/2015   Medicare annual wellness visit, subsequent 07/03/2015   Advanced care planning/counseling discussion 07/03/2015   Prediabetes 05/04/2015   Chest pain 05/04/2015   Ex-smoker 05/04/2015   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   Spermatocele 04/02/2013   Syncopal vertigo 11/04/2010   Lumbar disc disease with radiculopathy 11/04/2010   CAD, ARTERY BYPASS GRAFT 08/11/2009   PVD (peripheral vascular disease) with claudication ([place]) 08/11/2009   DUPUYTREN'S  CONTRACTURE, 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   Carotid stenosis 07/09/2008   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   Benign prostatic hyperplasia with urinary obstruction 09/06/2007   Essential hypertension 04/30/2007   GERD 04/30/2007   Hyperlipidemia 04/26/2007   OA (osteoarthritis) of knee 04/26/2007   DVT, HX OF 04/26/2007    Past Surgical History:  Procedure Laterality Date   ABDOMINAL AORTOGRAM W/LOWER EXTREMITY [name] 09/12/2018   Procedure: ABDOMINAL AORTOGRAM W/LOWER EXTREMITY;  Surgeon: [name], MD;  Location: [place] [facility];  Service: Cardiovascular;  Laterality: [name];   APPENDECTOMY     rupture   BACK SURGERY     1984 and then [facility]   CARDIAC CATHETERIZATION [name] 07/07/2015   Procedure: Left Heart Cath and Cors/Grafts Angiography;  Surgeon: [name], MD;  Location: [place] [facility];  Service: Cardiovascular;  Laterality: [name];   CATARACT EXTRACTION, BILATERAL     CERVICAL SPINE SURGERY  12/2016   cervical stenosis [name])   COLONOSCOPY  03/2010   HP polyp, diverticulosis, rpt 10 yrs ([name])   CORONARY ARTERY BYPASS GRAFT  1990   3 vessel    CYSTOSCOPY  12/23/10   [place]   EYE SURGERY     cataract   JOINT REPLACEMENT     KNEE ARTHROSCOPY Right    x2   LAMINOTOMY  1986   L5/S1 lumbar laminotomy for two ruptured discs/fusion   LUMBAR LAMINECTOMY/DECOMPRESSION MICRODISCECTOMY [name] 07/13/2016   Procedure: LUMBAR TWO-THREE, LUMBAR THREE-FOUR, LUMBAR FOUR-FIVE LAMINECTOMY AND FORAMINOTOMY;  Surgeon: [name], MD;  Location: [place];  Service: Neurosurgery;  Laterality: [name];  LAMINECTOMY AND FORAMINOTOMY L2-L3, L3-L4,L4-L5   LUMBAR SPINE SURGERY  06/2020   Bilateral redo laminectomy/laminotomy/foraminotomies/medial facetectomy to decompress the bilateral L4 and L5 nerve roots [name])   TOTAL HIP ARTHROPLASTY Bilateral [id number]   TOTAL KNEE ARTHROPLASTY Right 03/06/2017   Procedure: RIGHT TOTAL KNEE ARTHROPLASTY;  Surgeon: Gaynelle Arabian, MD;  Location: [facility];  Service: Orthopedics;  Laterality: Right;    Prior to Admission medications   Medication Sig Start Date End Date Taking? Authorizing Provider  acetaminophen (TYLENOL) 500 MG tablet Take 500 mg by mouth every 8 (eight) hours as needed.    [provider]  aspirin EC 81 MG EC tablet Take 1 tablet (81 mg total) by mouth daily. 06/26/19   Elgergawy, Silver Huguenin, MD  Cyanocobalamin (B-12) 1000 MCG SUBL Place 1 tablet under the tongue daily. 12/07/18   Ria Bush, MD  cyclobenzaprine (FLEXERIL) 10 MG tablet Take 1 tablet (10 mg total) by mouth 3 (three) times daily as needed for muscle spasms. 07/07/20   [name], MD  docusate sodium (COLACE) 100 MG capsule Take 100 mg by mouth 2 (two) times daily as needed for mild constipation.    [provider]  ezetimibe (ZETIA) 10 MG tablet Take 1 tablet (10 mg total) by mouth daily. 03/19/21   [name], MD  fluticasone (FLONASE) 50 MCG/ACT nasal spray Place into both nostrils as needed for allergies  or rhinitis.    [provider]  furosemide (LASIX) 20 MG tablet Take 1 tablet (20 mg total) by mouth every other day. 03/24/21   [name], MD  gabapentin (NEURONTIN) 300 MG capsule TAKE 1 CAPSULE BY MOUTH THREE TIMES DAILY 01/21/21   Ria Bush, MD  hydrocortisone 2.5 % lotion Apply topically daily as needed.    [provider]  isosorbide mononitrate (IMDUR) 30 MG 24 hr tablet Take 1 tablet (30 mg total) by mouth at bedtime. 03/19/21   [name], MD  losartan (COZAAR) 25 MG tablet Take 1 tablet (25 mg total) by mouth daily. 03/19/21   [name], MD  metoprolol succinate (TOPROL-XL) 25 MG 24 hr tablet Take 0.5 tablets (12.5 mg total) by mouth daily. 03/19/21   [name], MD  nitroGLYCERIN (NITROSTAT) 0.4 MG SL tablet Place 1 tablet (0.4 mg total) under the tongue every 5 (five) minutes as needed for chest pain. 03/19/21   [name], MD  omeprazole (PRILOSEC) 20 MG capsule  TAKE 1 CAPSULE EVERY DAY 12/29/20   Tyler Pita, MD  polyethylene glycol (MIRALAX / GLYCOLAX) 17 g packet Take 17 g by mouth daily as needed for mild constipation.     [provider]  PROAIR HFA 108 [phone] Base) MCG/ACT inhaler TAKE 2 PUFFS INTO LUNGS EVERY 6 HOURS ASNEEDED FOR WHEEZING OR SHORTNESS OF BREATH 02/19/21   Ria Bush, MD  ranolazine (RANEXA) 500 MG 12 hr tablet Take 1 tablet (500 mg total) by mouth 2 (two) times daily. 03/19/21   [name], MD  simvastatin (ZOCOR) 40 MG tablet Take 1 tablet (40 mg total) by mouth daily. 03/19/21   [name], MD  tamsulosin (FLOMAX) 0.4 MG CAPS capsule Take 0.4 mg by mouth at bedtime. 05/21/20   [provider]  tolterodine (DETROL LA) 4 MG 24 hr capsule Take 4 mg by mouth daily.    [provider]    Allergies Vioxx [rofecoxib], Spiriva respimat [tiotropium bromide monohydrate], Contrast media [iodinated diagnostic agents], and Morphine  Family History  Problem Relation Age of Onset   Stroke Mother    Heart attack Mother    Diabetes Mother    Stroke Father    Heart disease Father    Kidney disease Father        PCKD   Cancer Sister        throat   Diabetes Brother    Kidney disease Sister        PCKD   Cancer Other        5/7 nephews with lung cancer    Social History Social History   Tobacco Use   Smoking status: Former    Packs/day: 1.00    Years: 25.00    Pack years: 25.00    Types: Cigarettes    Quit date: 08/15/1988    Years since quitting: 32.7   Smokeless tobacco: Never  Vaping Use   Vaping Use: Never used  Substance Use Topics   Alcohol use: No    Alcohol/week: 0.0 standard drinks   Drug use: No    Review of Systems  Constitutional: No fever/chills Eyes: No visual changes. ENT: No sore throat. Cardiovascular: Positive for chest pain. Respiratory: Positive for shortness of breath. Gastrointestinal: No abdominal pain.  No nausea, no vomiting.  No diarrhea.  No  constipation. Genitourinary: Negative for dysuria. Musculoskeletal: Negative for back pain. Skin: Negative for rash. Neurological: Negative for headaches, focal weakness  or numbness. ____________________________________________   PHYSICAL EXAM:  VITAL SIGNS: ED Triage Vitals  Enc Vitals Group     BP 04/20/21 1309 (!) 115/53     Pulse Rate 04/20/21 1309 83     Resp 04/20/21 1309 18     Temp 04/20/21 1312 98.4 F (36.9 C)     Temp Source 04/20/21 1309 Oral     SpO2 04/20/21 1309 96 %     Weight 04/20/21 1309 214 lb (97.1 kg)     Height 04/20/21 1309 6' (1.829 m)     Head Circumference --      Peak Flow --      Pain Score 04/20/21 1309 0     Pain Loc --      Pain Edu? --      Excl. in [place]? --     Constitutional: Alert and oriented. Well appearing and in no acute distress. Eyes: Conjunctivae are normal.  Head: Atraumatic. Nose: No congestion/rhinnorhea. Mouth/Throat: Mucous membranes are moist.  Oropharynx non-erythematous. Neck: No stridor.   Hematological/Lymphatic/Immunilogical: No cervical lymphadenopathy. Cardiovascular: Normal rate, regular rhythm. Grossly normal heart sounds.  Good peripheral circulation. Respiratory: Normal respiratory effort.  No retractions. Lungs CTAB. Pleuritic pain  Gastrointestinal: Soft and nontender. No distention. No abdominal bruits. No CVA tenderness. Genitourinary:  Musculoskeletal: No lower extremity tenderness nor edema.  No joint effusions. Neurologic:  Normal speech and language. No gross focal neurologic deficits are appreciated. No gait instability. Skin:  Skin is warm, dry and intact. No rash noted. Psychiatric: Mood and affect are normal. Speech and behavior are normal.  ____________________________________________   LABS (all labs ordered are listed, but only abnormal results are displayed)  Labs Reviewed  BASIC METABOLIC PANEL - Abnormal; Notable for the following components:      Result Value   Glucose, Bld 152 (*)     BUN 37 (*)    Creatinine, Ser 1.41 (*)    GFR, Estimated 50 (*)    All other components within normal limits  CBC - Abnormal; Notable for the following components:   RBC 4.17 (*)    HCT 38.7 (*)    MCHC 36.4 (*)    All other components within normal limits  TROPONIN I (HIGH SENSITIVITY)  TROPONIN I (HIGH SENSITIVITY)   ____________________________________________  EKG  ED ECG REPORT I, Dee Paden, FNP-BC personally viewed and interpreted this ECG.   Date: 04/20/2021  EKG Time: 1304  Rate: 79  Rhythm: Sinus rhythm   Axis: normal  Intervals:none  ST&T Change: no ST elevation  ____________________________________________  RADIOLOGY  ED MD interpretation:    Chest x-ray shows cardiomegaly without effusion.  I, Sherrie George, personally viewed and evaluated these images (plain radiographs) as part of my medical decision making, as well as reviewing the written report by the radiologist.  Official radiology report(s): DG Chest 2 View  Result Date: 04/20/2021 CLINICAL DATA:  Chest pain with shortness of breath over the past 2 weeks. EXAM: CHEST - 2 VIEW COMPARISON:  07/16/2020 FINDINGS: Prior median sternotomy. Cervical spine fixation. Midline trachea. Mild cardiomegaly. Biapical pleural thickening. No pleural effusion or pneumothorax. Left lower lobe scarring is mild, similar to on the prior. There may also be scarring within the lateral right upper lobe. No congestive failure. No lobar consolidation. IMPRESSION: 1. No acute cardiopulmonary disease. 2. Cardiomegaly without congestive failure. Electronically Signed   By: Abigail Miyamoto M.D.   On: 04/20/2021 13:58    ____________________________________________   PROCEDURES  Procedure(s) performed (including Critical  Care):  Procedures  ____________________________________________   INITIAL IMPRESSION / ASSESSMENT AND PLAN     80 year old male presenting to the emergency department for treatment and evaluation of  pleuritic chest pain that has been present as well as shortness of breath for the past 2 weeks.  See HPI for further details.  Plan will be to get a CTA of the chest to rule out PE.  Patient does have a documented contrast allergy.  4-hour prep to be given.  DIFFERENTIAL DIAGNOSIS  PE, CHF, CAD, costochondritis  ED COURSE  Labs are overall reassuring.  BUN and creatinine are near patient's baseline.  Troponin is normal.  Chest x-ray shows some cardiomegaly but no effusion.  ----------------------------------------- 7:28 PM on 04/20/2021 ----------------------------------------- CT planned for 8pm. Care transitioned to J. Sherral Hammers, PA-C who will follow up on CT and make disposition.    ___________________________________________   FINAL CLINICAL IMPRESSION(S) / ED DIAGNOSES  Final diagnoses:  Pleuritic chest pain     ED Discharge Orders     None        AZAREL MCNIFF was evaluated in Emergency Department on 04/20/2021 for the symptoms described in the history of present illness. He was evaluated in the context of the global COVID-19 pandemic, which necessitated consideration that the patient might be at risk for infection with the SARS-CoV-2 virus that causes COVID-19. Institutional protocols and algorithms that pertain to the evaluation of patients at risk for COVID-19 are in a state of rapid change based on information released by regulatory bodies including the CDC and federal and state organizations. These policies and algorithms were followed during the patient's care in the ED.   Note:  This document was prepared using Dragon voice recognition software and may include unintentional dictation errors.    Victorino Dike, FNP 04/20/21 1930    Rada Hay, MD 04/20/21 [phone]

## 2021-04-20 NOTE — Telephone Encounter (Signed)
Dr. Patsey Berthold patient last seen 03/23/2021 for COPD.   Patient reports of low spo2 in the afternoon around 5pm. Spo2 has dropped as low at 88% on roomair. During the time of low spo2, he develops chest discomfort (burning sensation/ache) and increased sob. Spo2 recovers to low 90% after rest with pursed lip breathing.  Denies f/c/s or additional sx.  Recent ONO. Has not received results yet.   Using trelegy once daily and ventolin HFA Q6H. Called Melissa with [facility] and requested ONO report.   Dr. Patsey Berthold, please advise. Thanks

## 2021-04-20 NOTE — Telephone Encounter (Signed)
Was able to reach back out to [name], advised low oxygen sats in the 80s is concerning given his SOB and chest tightness. Advised the chest tightness could be from not being able to oxygenate. Suggested being evaluated in the ED for low 02, SOB, and chest tightness, or could Dr. Buelah Manis office to see what they recommended regarding his low 02 and SOB. Pt verbalized understanding, reports just seen Dr. Buelah Manis back in Aug.  [name] verbalized understanding and will call Dr. Buelah Manis office. Will route message to pulmonology Margie, [place] as FYI.

## 2021-04-20 NOTE — Telephone Encounter (Signed)
Per Dr. Patsey Berthold verbally-- recommend ED for eval. Need to rule out PE.    Patient is aware and voiced his understanding.  Nothing further needed.

## 2021-04-20 NOTE — Telephone Encounter (Signed)
Patient states for the past 3 to 4 days and late in the afternoon his oxygen levels are going down in the 80s  Sometimes leads to chest tightness Please call to discuss

## 2021-04-21 ENCOUNTER — Encounter: Payer: Self-pay | Admitting: Family Medicine

## 2021-04-21 DIAGNOSIS — I2510 Atherosclerotic heart disease of native coronary artery without angina pectoris: Secondary | ICD-10-CM | POA: Diagnosis not present

## 2021-04-21 DIAGNOSIS — R072 Precordial pain: Secondary | ICD-10-CM

## 2021-04-21 DIAGNOSIS — I10 Essential (primary) hypertension: Secondary | ICD-10-CM | POA: Diagnosis not present

## 2021-04-21 DIAGNOSIS — R0781 Pleurodynia: Secondary | ICD-10-CM | POA: Diagnosis not present

## 2021-04-21 DIAGNOSIS — I5030 Unspecified diastolic (congestive) heart failure: Secondary | ICD-10-CM

## 2021-04-21 DIAGNOSIS — I200 Unstable angina: Secondary | ICD-10-CM | POA: Diagnosis not present

## 2021-04-21 DIAGNOSIS — I25118 Atherosclerotic heart disease of native coronary artery with other forms of angina pectoris: Secondary | ICD-10-CM | POA: Diagnosis not present

## 2021-04-21 LAB — BASIC METABOLIC PANEL
Anion gap: 6 (ref 5–15)
BUN: 30 mg/dL — ABNORMAL HIGH (ref 8–23)
CO2: 27 mmol/L (ref 22–32)
Calcium: 8.5 mg/dL — ABNORMAL LOW (ref 8.9–10.3)
Chloride: 106 mmol/L (ref 98–111)
Creatinine, Ser: 1.15 mg/dL (ref 0.61–1.24)
GFR, Estimated: >60 mL/min (ref >60)
Glucose, Bld: 89 mg/dL (ref 70–99)
Potassium: 3.7 mmol/L (ref 3.5–5.1)
Sodium: 139 mmol/L (ref 135–145)

## 2021-04-21 LAB — CBC
HCT: 36.6 % — ABNORMAL LOW (ref 39.0–52.0)
Hemoglobin: 12.8 g/dL — ABNORMAL LOW (ref 13.0–17.0)
MCH: 32.6 pg (ref 26.0–34.0)
MCHC: 35 g/dL (ref 30.0–36.0)
MCV: 93.1 fL (ref 80.0–100.0)
Platelets: 150 10*3/uL (ref 150–400)
RBC: 3.93 MIL/uL — ABNORMAL LOW (ref 4.22–5.81)
RDW: 11.9 % (ref 11.5–15.5)
WBC: 5.7 10*3/uL (ref 4.0–10.5)
nRBC: 0 % (ref 0.0–0.2)

## 2021-04-21 LAB — HEPARIN LEVEL (UNFRACTIONATED)
Heparin Unfractionated: 0.33 IU/mL (ref 0.30–0.70)
Heparin Unfractionated: <0.1 IU/mL — ABNORMAL LOW (ref 0.30–0.70)

## 2021-04-21 LAB — RESP PANEL BY RT-PCR (FLU A&B, COVID) ARPGX2
Influenza A by PCR: NEGATIVE
Influenza B by PCR: NEGATIVE
SARS Coronavirus 2 by RT PCR: NEGATIVE

## 2021-04-21 LAB — PROTIME-INR
INR: 1.2 (ref 0.8–1.2)
Prothrombin Time: 14.8 seconds (ref 11.4–15.2)

## 2021-04-21 LAB — APTT: aPTT: >200 seconds (ref 24–36)

## 2021-04-21 MED ORDER — INFLUENZA VAC A&B SA ADJ QUAD 0.5 ML IM PRSY
0.5000 mL | PREFILLED_SYRINGE | INTRAMUSCULAR | Status: DC
  Filled 2021-04-21: qty 0.5

## 2021-04-21 MED ORDER — ISOSORBIDE MONONITRATE ER 60 MG PO TB24
60.0000 mg | ORAL_TABLET | Freq: Every day | ORAL | Status: DC
  Administered 2021-04-21: 60 mg via ORAL
  Filled 2021-04-21: qty 1

## 2021-04-21 MED ORDER — ATORVASTATIN CALCIUM 20 MG PO TABS
20.0000 mg | ORAL_TABLET | Freq: Every day | ORAL | Status: DC
  Filled 2021-04-21: qty 1

## 2021-04-21 MED ORDER — ALBUTEROL SULFATE (2.5 MG/3ML) 0.083% IN NEBU: 2.5000 mg | INHALATION_SOLUTION | RESPIRATORY_TRACT | Status: DC | PRN

## 2021-04-21 NOTE — Consult Note (Signed)
Cardiology Consultation:   Patient ID: [name] MRN: [id number]; DOB: [birth year]  Admit date: 04/20/2021 Date of Consult: 04/21/2021  PCP:  Ria Bush, MD   [facility] Providers Cardiologist:  Ida Rogue, MD        Patient Profile:   [name] is a 80 y.o. male with a hx of CAD/CABG, PCI who is being seen 04/21/2021 for the evaluation of chest pain at the request of Dr. Reesa Chew.  History of Present Illness:   Mr. [name] is an 80 year old male with history of CAD/CABG 1998, PCI to left circumflex 2002, hypertension, CKD, COPD presenting with shortness of breath and chest pain.    Endorsed having worsening shortness of breath over the past 3 to 4 weeks.  Was seen in the clinic by primary cardiologist a month ago, started on Lasix daily.  He states his symptoms did not improve over the past 3 weeks prompting him to come to the emergency room.  Symptoms consist of chest discomfort which she describes as burning, sometimes associated with exertion.  He denies chest pain during my exam.  Upon admission, BNP was normal, troponins were normal at 7, 7.  EKG showed normal sinus rhythm no acute ischemic changes.  Patient was started on heparin drip for possible unstable angina.  Chest CT was negative for PE, mild emphysema noted.  Started on Ranexa for antianginal benefit.  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 06/2019 Echo (in setting of COVID): EF 35-40%, mild LVH, g1 DD, glob HK; b. 09/2019 Echo: eF 50-55%, no rwma, Gr1 DD, nl RV size/fxn.   Actinic keratosis    Anginal pain ([place])    BENIGN PROSTATIC HYPERTROPHY, WITH URINARY OBSTRUCTION 09/06/2007   CAD s/p CABG    a. 1990 s/p MI-->CABG x 2; b. 2002 s/p BMS to LCX; c. 06/2015 Cath: LM 50ost, LAD 100ost/p, 73md, D2 nl, LCX  patent stent, RCA 80p (small), LIMA->LAD nl, VG->D2 nl-->Med Rx; d. 05/2020 MV: fixed apical ant/apical defect. No signif  ischemia.   Carotid arterial disease ([place]    a. 08/2016 Carotid U/S: <39% bilat.   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 did not tolerate CPAP.   Osteoarthritis    PAD (peripheral artery disease) ([place]    a. 07/2017 LE duplex: RSFA [id number]; c. 08/2018 Periph Angio: No signif AoIliac dzs. Mod-sev Ca2+ RSFA w/ diff dzs throughout- 3 vessel runoff. Borderline signif LSFA dzs w/ mod-sev Ca2+ vessels and 3 vessel runoff below the knee-->Med rx.   Pneumonia 07/2014   [facility] hospitalization   Pneumonia due to COVID-19 virus 06/02/2019   Pre-diabetes    PSA, INCREASED 07/09/2008   Spinal stenosis of lumbar region  Past Surgical History:  Procedure Laterality Date   ABDOMINAL AORTOGRAM W/LOWER EXTREMITY [name] 09/12/2018   Procedure: ABDOMINAL AORTOGRAM W/LOWER EXTREMITY;  Surgeon: [name], MD;  Location: [place] [facility];  Service: Cardiovascular;  Laterality: [name];   APPENDECTOMY     rupture   BACK SURGERY     1984 and then [facility]   CARDIAC CATHETERIZATION [name] 07/07/2015   Procedure: Left Heart Cath and Cors/Grafts Angiography;  Surgeon: [name], MD;  Location: [place] [facility];  Service: Cardiovascular;  Laterality: [name];   CATARACT EXTRACTION, BILATERAL     CERVICAL SPINE SURGERY  12/2016   cervical stenosis [name])   COLONOSCOPY   03/2010   HP polyp, diverticulosis, rpt 10 yrs ([name])   CORONARY ARTERY BYPASS GRAFT  1990   3 vessel    CYSTOSCOPY  12/23/10   [facility]   EYE SURGERY     cataract   JOINT REPLACEMENT     KNEE ARTHROSCOPY Right    x2   LAMINOTOMY  1986   L5/S1 lumbar laminotomy for two ruptured discs/fusion   LUMBAR LAMINECTOMY/DECOMPRESSION MICRODISCECTOMY [name] 07/13/2016   Procedure: LUMBAR TWO-THREE, LUMBAR THREE-FOUR, LUMBAR FOUR-FIVE LAMINECTOMY AND FORAMINOTOMY;  Surgeon: [name], MD;  Location: [place];  Service: Neurosurgery;  Laterality: [name];  LAMINECTOMY AND FORAMINOTOMY L2-L3, L3-L4,L4-L5   LUMBAR SPINE SURGERY  06/2020   Bilateral redo laminectomy/laminotomy/foraminotomies/medial facetectomy to decompress the bilateral L4 and L5 nerve roots [name])   TOTAL HIP ARTHROPLASTY Bilateral [id number]   TOTAL KNEE ARTHROPLASTY Right 03/06/2017   Procedure: RIGHT TOTAL KNEE ARTHROPLASTY;  Surgeon: Gaynelle Arabian, MD;  Location: [facility];  Service: Orthopedics;  Laterality: Right;     Home Medications:  Prior to Admission medications   Medication Sig Start Date End Date Taking? Authorizing Provider  acetaminophen (TYLENOL) 500 MG tablet Take 500 mg by mouth every 8 (eight) hours as needed.   Yes [provider]  aspirin EC 81 MG EC tablet Take 1 tablet (81 mg total) by mouth daily. 06/26/19  Yes Elgergawy, Silver Huguenin, MD  Cyanocobalamin (B-12) 1000 MCG SUBL Place 1 tablet under the tongue daily. 12/07/18  Yes Ria Bush, MD  ezetimibe (ZETIA) 10 MG tablet Take 1 tablet (10 mg total) by mouth daily. 03/19/21  Yes [name], MD  gabapentin (NEURONTIN) 300 MG capsule TAKE 1 CAPSULE BY MOUTH THREE TIMES DAILY 01/21/21  Yes Ria Bush, MD  losartan (COZAAR) 25 MG tablet Take 1 tablet (25 mg total) by mouth daily. 03/19/21  Yes [name], MD  metoprolol succinate (TOPROL-XL) 25 MG 24 hr tablet Take 0.5 tablets (12.5 mg total) by mouth daily. 03/19/21  Yes [name], MD   omeprazole (PRILOSEC) 20 MG capsule TAKE 1 CAPSULE EVERY DAY 12/29/20  Yes Tyler Pita, MD  polyethylene glycol (MIRALAX / GLYCOLAX) 17 g packet Take 17 g by mouth daily as needed for mild constipation.    Yes [provider]  PROAIR HFA 108 (90 Base) MCG/ACT inhaler TAKE 2 PUFFS INTO LUNGS EVERY 6 HOURS ASNEEDED FOR WHEEZING OR SHORTNESS OF BREATH 02/19/21  Yes Ria Bush, MD  ranolazine (RANEXA) 500 MG 12 hr tablet Take 1 tablet (500 mg total) by mouth 2 (two) times daily. 03/19/21  Yes [name], MD  simvastatin (ZOCOR) 40 MG tablet Take 1 tablet (40 mg total) by mouth daily. 03/19/21  Yes [name], MD  tamsulosin (FLOMAX) 0.4 MG CAPS capsule Take 0.4 mg by mouth  at bedtime. 05/21/20  Yes [provider]  tolterodine (DETROL LA) 4 MG 24 hr capsule Take 4 mg by mouth daily.   Yes [provider]  cyclobenzaprine (FLEXERIL) 5 MG tablet Take 5 mg by mouth 3 (three) times daily as needed. 04/09/21   [provider]  docusate sodium (COLACE) 100 MG capsule Take 100 mg by mouth 2 (two) times daily as needed for mild constipation.    [provider]  fluticasone (FLONASE) 50 MCG/ACT nasal spray Place into both nostrils as needed for allergies or rhinitis.    [provider]  furosemide (LASIX) 20 MG tablet Take 1 tablet (20 mg total) by mouth every other day. 03/24/21   [name], MD  hydrocortisone 2.5 % lotion Apply topically daily as needed.    [provider]  isosorbide mononitrate (IMDUR) 30 MG 24 hr tablet Take 1 tablet (30 mg total) by mouth at bedtime. 03/19/21   [name], MD  nitroGLYCERIN (NITROSTAT) 0.4 MG SL tablet Place 1 tablet (0.4 mg total) under the tongue every 5 (five) minutes as needed for chest pain. 03/19/21   [name], MD    Inpatient Medications: Scheduled Meds:  aspirin EC  81 mg Oral Daily   ezetimibe  10 mg Oral Daily   fesoterodine  4 mg Oral Daily   furosemide  20  mg Oral QODAY   gabapentin  300 mg Oral TID   isosorbide mononitrate  60 mg Oral QHS   losartan  25 mg Oral Daily   metoprolol succinate  12.5 mg Oral Daily   pantoprazole  40 mg Oral Daily   ranolazine  500 mg Oral BID   simvastatin  40 mg Oral q1800   tamsulosin  0.4 mg Oral QHS   vitamin B-12  1,000 mcg Oral Daily   Continuous Infusions:  PRN Meds: acetaminophen **OR** acetaminophen, albuterol, cyclobenzaprine, docusate sodium, fluticasone, magnesium hydroxide, nitroGLYCERIN, ondansetron **OR** ondansetron (ZOFRAN) IV, polyethylene glycol, traZODone  Allergies:    Allergies  Allergen Reactions   Vioxx [Rofecoxib] Other (See Comments)    Hemorrhage    Spiriva Respimat [Tiotropium Bromide Monohydrate] Other (See Comments)    Elevated bp   Contrast Media [Iodinated Diagnostic Agents] Itching and Rash    Delayed reaction post abdominal aortagram.    Morphine Nausea Only and Other (See Comments)    Irritability     Social History:   Social History   Socioeconomic History   Marital status: Married    Spouse name: Not on file   Number of children: Not on file   Years of education: Not on file   Highest education level: Not on file  Occupational History   Not on file  Tobacco Use   Smoking status: Former    Packs/day: 1.00    Years: 25.00    Pack years: 25.00    Types: Cigarettes    Quit date: 08/15/1988    Years since quitting: 32.7   Smokeless tobacco: Never  Vaping Use   Vaping Use: Never used  Substance and Sexual Activity   Alcohol use: No    Alcohol/week: 0.0 standard drinks   Drug use: No   Sexual activity: Yes  Other Topics Concern   Not on file  Social History Narrative   Married, wife [name], for 25 years in 2016. 1 daughter and 3 grandkids from first marriage.    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    Activity:  part time farmer, raise goats and chickens. Mows yard. Volunteer work through church ([employer]).    Diet:  good water, fruits/vegetables daily      OSA eval recommended while hospitalized with Covid 05/2019   Social Determinants of Health   Financial Resource Strain: Low Risk    Difficulty of Paying Living Expenses: Not hard at all  Food Insecurity: No Food Insecurity   Worried About Charity fundraiser in the Last Year: Never true   Ran Out of Food in the Last Year: Never true  Transportation Needs: No Transportation Needs   Lack of Transportation (Medical): No   Lack of Transportation (Non-Medical): No  Physical Activity: Inactive   Days of Exercise per Week: 0 days   Minutes of Exercise per Session: 0 min  Stress: No Stress Concern Present   Feeling of Stress : Not at all  Social Connections: Not on file  Intimate Partner Violence: Not At Risk   Fear of Current or Ex-Partner: No   Emotionally Abused: No   Physically Abused: No   Sexually Abused: No    Family History:    Family History  Problem Relation Age of Onset   Stroke Mother    Heart attack Mother    Diabetes Mother    Stroke Father    Heart disease Father    Kidney disease Father        PCKD   Cancer Sister        throat   Diabetes Brother    Kidney disease Sister        PCKD   Cancer Other        5/7 nephews with lung cancer     ROS:  Please see the history of present illness.   All other ROS reviewed and negative.     Physical Exam/Data:   Vitals:   04/21/21 0700 04/21/21 0800 04/21/21 1100 04/21/21 1314  BP: (!) 148/60 139/62 (!) 142/62 (!) 141/59  Pulse: 74 75 69 65  Resp: '19 20 14 19  '$ Temp:  97.8 F (36.6 C)  98 F (36.7 C)  TempSrc:  Oral  Oral  SpO2: 96% 91% 97% 95%  Weight:   98.2 kg   Height:        Intake/Output Summary (Last 24 hours) at 04/21/2021 1512 Last data filed at 04/21/2021 1443 Gross per 24 hour  Intake 2080.45 ml  Output 1500 ml  Net 580.45 ml   Last 3 Weights 04/21/2021 04/20/2021 04/01/2021  Weight (lbs) 216 lb 9.6 oz 214 lb 215 lb  Weight (kg) 98.249 kg 97.07 kg 97.523  kg     Body mass index is 29.38 kg/m.  General:  Well nourished, well developed, in no acute distress HEENT: normal Lymph: no adenopathy Neck: no JVD Endocrine:  No thryomegaly Vascular: No carotid bruits; FA pulses 2+ bilaterally without bruits  Cardiac:  normal S1, S2; RRR; no murmur  Lungs:  clear anteriorly Abd: soft, nontender, no hepatomegaly  Ext: no edema Musculoskeletal:  No deformities, BUE and BLE strength normal and equal Skin: warm and dry  Neuro:  CNs 2-12 intact, no focal abnormalities noted Psych:  Normal affect   EKG:  The EKG was personally reviewed and demonstrates: Normal sinus rhythm Telemetry:  Telemetry was personally reviewed and demonstrates: Normal sinus rhythm  Relevant CV Studies: TTE 03/22/2021 1. Left ventricular ejection fraction, by estimation, is 50 to 55%. The  left ventricle has low normal function. The left ventricle has no  regional  wall motion abnormalities. There is mild left ventricular hypertrophy.  Left ventricular diastolic  parameters are consistent with Grade I diastolic dysfunction (impaired  relaxation).   2. Right ventricular systolic function is normal. The right ventricular  size is normal. There is mildly elevated pulmonary artery systolic  pressure. The estimated right ventricular systolic pressure is 123456 mmHg.   3. Left atrial size was mild to moderately dilated.   4. The mitral valve is normal in structure. Trivial mitral valve  regurgitation. No evidence of mitral stenosis.   5. The aortic valve is normal in structure. Aortic valve regurgitation is  not visualized. Mild to moderate aortic valve sclerosis/calcification is  present, without any evidence of aortic stenosis.   Laboratory Data:  High Sensitivity Troponin:   Recent Labs  Lab 04/20/21 1312 04/20/21 1611  TROPONINIHS 7 7     Chemistry Recent Labs  Lab 04/20/21 1312 04/21/21 0608  NA 136 139  K 4.1 3.7  CL 103 106  CO2 27 27  GLUCOSE 152* 89  BUN  37* 30*  CREATININE 1.41* 1.15  CALCIUM 9.0 8.5*  GFRNONAA 50* >60  ANIONGAP 6 6    No results for input(s): PROT, ALBUMIN, AST, ALT, ALKPHOS, BILITOT in the last 168 hours. Hematology Recent Labs  Lab 04/20/21 1312 04/21/21 0608  WBC 6.1 5.7  RBC 4.17* 3.93*  HGB 14.1 12.8*  HCT 38.7* 36.6*  MCV 92.8 93.1  MCH 33.8 32.6  MCHC 36.4* 35.0  RDW 11.9 11.9  PLT 161 150   BNP Recent Labs  Lab 04/20/21 2305  BNP 71.3    DDimer No results for input(s): DDIMER in the last 168 hours.   Radiology/Studies:  DG Chest 2 View  Result Date: 04/20/2021 CLINICAL DATA:  Chest pain with shortness of breath over the past 2 weeks. EXAM: CHEST - 2 VIEW COMPARISON:  07/16/2020 FINDINGS: Prior median sternotomy. Cervical spine fixation. Midline trachea. Mild cardiomegaly. Biapical pleural thickening. No pleural effusion or pneumothorax. Left lower lobe scarring is mild, similar to on the prior. There may also be scarring within the lateral right upper lobe. No congestive failure. No lobar consolidation. IMPRESSION: 1. No acute cardiopulmonary disease. 2. Cardiomegaly without congestive failure. Electronically Signed   By: Abigail Miyamoto M.D.   On: 04/20/2021 13:58   CT Angio Chest PE W and/or Wo Contrast  Result Date: 04/20/2021 CLINICAL DATA:  PE suspected, high prob. Intermittent chest pain with increasing dyspnea EXAM: CT ANGIOGRAPHY CHEST WITH CONTRAST TECHNIQUE: Multidetector CT imaging of the chest was performed using the standard protocol during bolus administration of intravenous contrast. Multiplanar CT image reconstructions and MIPs were obtained to evaluate the vascular anatomy. CONTRAST:  59m OMNIPAQUE IOHEXOL 350 MG/ML SOLN COMPARISON:  12/18/2019 FINDINGS: Cardiovascular: There is adequate opacification of the pulmonary arterial tree. No intraluminal filling defect identified to suggest acute pulmonary embolism through the segmental level. The central pulmonary arteries are of normal  caliber. Coronary artery bypass grafting has been performed. Extensive calcification is seen within the native coronary arteries. Global cardiac size is within normal limits. No pericardial effusion. The thoracic aorta is of normal caliber and demonstrates moderate mixed atherosclerotic plaque. There is mild plaque ulceration noted involving atheromatous plaque along the ventral surface of the aortic arch, best appreciated on axial image # 34/4 and sagittal reformat # 76/8. There is no mural penetration noted. No periaortic inflammatory change. Mediastinum/Nodes: No enlarged mediastinal, hilar, or axillary lymph nodes. Thyroid gland, trachea, and esophagus demonstrate no significant  findings. Lungs/Pleura: Mild emphysema. Bibasilar parenchymal scarring appears stable since prior examination, likely post inflammatory in nature. No pneumothorax or pleural effusion. No superimposed focal pulmonary infiltrate. The central airways are widely patent. Upper Abdomen: Prominent atheromatous plaque is seen within the abdominal aorta at the level of the diaphragmatic hiatus. No acute abnormality. Musculoskeletal: No acute bone abnormality. No lytic or blastic bone lesion. Review of the MIP images confirms the above findings. IMPRESSION: No pulmonary embolism.  No acute intrathoracic pathology identified. Mild emphysema. Stable bibasilar parenchymal scarring, likely post inflammatory in nature. Peripheral vascular disease with ulcerated atheromatous plaque within the aortic arch and prominent coral-like atheromatous plaque within the abdominal aorta at the level of the diaphragmatic hiatus. Clinical correlation for evidence of peripheral thromboembolism (blue toe syndrome, etc.) may be helpful for further management. Aortic Atherosclerosis (ICD10-I70.0) and Emphysema (ICD10-J43.9). Electronically Signed   By: Fidela Salisbury M.D.   On: 04/20/2021 20:23     Assessment and Plan:   Chest pain, hx of CAD/CABG, PCI -Troponins  normal, currently asymptomatic.  Symptoms are not consistent with ACS/unstable angina. -Stop heparin drip -Increase Imdur to 60 mg daily.  Continue Ranexa as prescribed. -Aspirin, statin, Toprol-XL, Zetia to continue. -Check Myoview in the a.m. for high risk ischemia. -Patient encouraged to ambulate today, if no further chest pain on Imdur and Ranexa, patient could potentially go home tomorrow if Myoview results are without high risk ischemia. -Keep n.p.o. after midnight.  2.  Hypertension -Imdur as above, continue Toprol-XL.  3. Hfpef -Continue oral Lasix 20 mg daily. -Emphysema could also be contributing to shortness of breath.  Total encounter time 35 minutes  Greater than 50% was spent in counseling and coordination of care with the patient   Signed, Kate Sable, MD  04/21/2021 3:12 PM

## 2021-04-21 NOTE — Final Consult Note (Signed)
ANTICOAGULATION CONSULT NOTE - Follow Up Consult  Pharmacy Consult for Heparin gtt Indication: ACS/chest pain  Allergies  Allergen Reactions   Vioxx [Rofecoxib] Other (See Comments)    Hemorrhage    Spiriva Respimat [Tiotropium Bromide Monohydrate] Other (See Comments)    Elevated bp   Contrast Media [Iodinated Diagnostic Agents] Itching and Rash    Delayed reaction post abdominal aortagram.    Morphine Nausea Only and Other (See Comments)    Irritability     Patient Measurements: Height: 6' (182.9 cm) Weight: 98.2 kg (216 lb 9.6 oz) IBW/kg (Calculated) : 77.6 Heparin Dosing Weight: 97kg  Vital Signs: Temp: 98 F (36.7 C) (09/07 1314) Temp Source: Oral (09/07 1314) BP: 141/59 (09/07 1314) Pulse Rate: 65 (09/07 1314)  Labs: Recent Labs    04/20/21 1312 04/20/21 1611 04/20/21 2305 04/21/21 0608 04/21/21 0636 04/21/21 1507  HGB 14.1  --   --  12.8*  --   --   HCT 38.7*  --   --  36.6*  --   --   PLT 161  --   --  150  --   --   APTT  --   --  >200*  --   --   --   LABPROT  --   --  14.8  --   --   --   INR  --   --  1.2  --   --   --   HEPARINUNFRC  --   --   --   --  0.33 <0.10*  CREATININE 1.41*  --   --  1.15  --   --   TROPONINIHS 7 7  --   --   --   --      Estimated Creatinine Clearance: 62.2 mL/min (by C-G formula based on SCr of 1.15 mg/dL).   Medications:  Scheduled:   aspirin EC  81 mg Oral Daily   ezetimibe  10 mg Oral Daily   fesoterodine  4 mg Oral Daily   furosemide  20 mg Oral QODAY   gabapentin  300 mg Oral TID   [START ON 04/22/2021] influenza vaccine adjuvanted  0.5 mL Intramuscular Tomorrow-1000   isosorbide mononitrate  60 mg Oral QHS   losartan  25 mg Oral Daily   metoprolol succinate  12.5 mg Oral Daily   pantoprazole  40 mg Oral Daily   ranolazine  500 mg Oral BID   simvastatin  40 mg Oral q1800   tamsulosin  0.4 mg Oral QHS   vitamin B-12  1,000 mcg Oral Daily   Infusions:     Assessment: Patient with PMH relevant for CAD  s/p CABG x 3, PCI and stent placement, CHF, DVT, hypertension, dyslipidemia and obstructive sleep apnea. He presented to the ER with acute onset of exertional chest pain   Goal of Therapy:  Heparin level 0.3-0.7 units/ml Monitor platelets by anticoagulation protocol: Yes  04/21/2021 @ 0702  HL 0.33 - therapeutic   Plan:  Heparin gtt discontinued per cardiology (Dr.Agbor). Will discontinue related consult and labs at this time.   Thank you for allowing pharmacy to be involved in mgmt of the patient's care and reconsult as needed.  Darrin Luis PharmD, [facility] 04/21/2021 4:19 PM

## 2021-04-21 NOTE — Progress Notes (Signed)
ANTICOAGULATION CONSULT NOTE - Follow Up Consult  Pharmacy Consult for Heparin gtt Indication: ACS/chest pain  Allergies  Allergen Reactions   Vioxx [Rofecoxib] Other (See Comments)    Hemorrhage    Spiriva Respimat [Tiotropium Bromide Monohydrate] Other (See Comments)    Elevated bp   Contrast Media [Iodinated Diagnostic Agents] Itching and Rash    Delayed reaction post abdominal aortagram.    Morphine Nausea Only and Other (See Comments)    Irritability     Patient Measurements: Height: 6' (182.9 cm) Weight: 97.1 kg (214 lb) IBW/kg (Calculated) : 77.6 Heparin Dosing Weight: 97kg  Vital Signs: BP: 148/60 (09/07 0700) Pulse Rate: 74 (09/07 0700)  Labs: Recent Labs    04/20/21 1312 04/20/21 1611 04/20/21 2305 04/21/21 0608 04/21/21 0636  HGB 14.1  --   --  12.8*  --   HCT 38.7*  --   --  36.6*  --   PLT 161  --   --  150  --   APTT  --   --  >200*  --   --   LABPROT  --   --  14.8  --   --   INR  --   --  1.2  --   --   HEPARINUNFRC  --   --   --   --  0.33  CREATININE 1.41*  --   --  1.15  --   TROPONINIHS 7 7  --   --   --     Estimated Creatinine Clearance: 61.9 mL/min (by C-G formula based on SCr of 1.15 mg/dL).   Medications:  Scheduled:   aspirin  81 mg Oral Daily   aspirin  325 mg Oral Daily   B-12  1 tablet Sublingual Daily   ezetimibe  10 mg Oral Daily   fesoterodine  4 mg Oral Daily   furosemide  20 mg Oral QODAY   gabapentin  300 mg Oral TID   isosorbide mononitrate  30 mg Oral QHS   losartan  25 mg Oral Daily   metoprolol succinate  12.5 mg Oral Daily   pantoprazole  40 mg Oral Daily   pantoprazole  40 mg Oral Daily   ranolazine  500 mg Oral BID   simvastatin  40 mg Oral Daily   tamsulosin  0.4 mg Oral QHS   Infusions:   sodium chloride 75 mL/hr at 04/21/21 0605   heparin 1,300 Units/hr (04/21/21 0604)    Assessment: Patient with PMH relevant for CAD s/p CABG x 3, PCI and stent placement, CHF, DVT, hypertension, dyslipidemia and  obstructive sleep apnea. He presented to the ER with acute onset of exertional chest pain   Goal of Therapy:  Heparin level 0.3-0.7 units/ml Monitor platelets by anticoagulation protocol: Yes  04/21/2021 @ 0702  HL 0.33 - therapeutic   Plan:  Continue heparin infusion at 1300 units/hr Repeat HL in 8 hours to confirm appropriate rate Continue to monitor H&H and platelets  Orlander Norwood Rodriguez-Guzman PharmD, BCPS 04/21/2021 7:40 AM

## 2021-04-21 NOTE — Telephone Encounter (Signed)
Gabapentin Last filled:  01/21/21, #[id number] Last OV:  02/23/21, AWV prt 2 Next OV:  05/26/21, 2-3 mo neuropathy f/u

## 2021-04-21 NOTE — Progress Notes (Signed)
PHARMACIST - PHYSICIAN COMMUNICATION  CONCERNING:  Simvastatin dose with amlodipine   RECOMMENDATION: Patient was prescribed simvastatin 40 mg qHS (home regimen)  Dose of simvastatin should not exceed 20 mg daily if coadministering with ranolazine due to increased risk of rhabdomyolysis   DESCRIPTION: Pharmacy has substituted equivalent dose of atorvastatin    Patient is now receiving atorvastatin 20 mg qHS    Dorothe Pea, PharmD, BCPS Clinical Pharmacist  04/21/2021 8:15 PM

## 2021-04-21 NOTE — ED Notes (Signed)
Heparin restarted.

## 2021-04-21 NOTE — Progress Notes (Signed)
PROGRESS NOTE    [name]  [id number] DOB: [birth year] DOA: 04/20/2021 PCP: Ria Bush, MD   Brief Narrative: Taken from H&P. [name] is a 80 y.o. male with medical history significant for coronary artery disease status post three-vessel CABG, status post PCI and stent, CHF, DVT, hypertension, dyslipidemia and obstructive sleep apnea, who presented to the ER with acute onset of exertional chest pain which has been going on over the last couple weeks with associated chest discomfort in the midsternal area. He was also having worsening dyspnea, recently started on Lasix by his cardiologist with no improvement. On arrival he was hemodynamically stable, negative troponin, BNP within normal limit.  CT chest with no PE but did show some emphysema.  EKG with some T wave inversions in lateral leads. Admitted for concern of unstable angina and cardiology was consulted. He was also started on heparin infusion which was later discontinued by cardiology. Cardiology increase the dose of Imdur and he will be going for Myoview tomorrow morning.  Subjective: Patient was seen and examined today.  Denies any more chest pain.  Per patient he was having some chest tightness and associated dyspnea while walking which normally relieved with rest. No associated diaphoresis, nausea or vomiting.  No orthopnea or PND.  Assessment & Plan:   Active Problems:   Unstable angina (HCC)   Coronary artery disease involving native coronary artery of native heart   Primary hypertension  Chest pain/history of CAD s/p CABG and PCI.  Patient has some typical features of exertional chest tightness and dyspnea.  Emphysema can be contributory to dyspnea, no orthopnea or PND. Troponin and BNP remains within normal limit.  Cardiology was consulted and he was started on heparin infusion for concern of angina. -Cardiology discontinued heparin infusion. -Increase the dose of Imdur -Continue with Ranexa,  beta-blocker and statin. -Continue with aspirin -Going for Myoview tomorrow morning. -Continue to monitor -Nitroglycerin as needed  BPH. -Continue with Flomax.  GERD. -Continue with PPI.  Overactive bladder. -Continue with Detrol LA.  Hypertension. -Continue with Cozaar, Toprol and Imdur.  Objective: Vitals:   04/21/21 0700 04/21/21 0800 04/21/21 1100 04/21/21 1314  BP: (!) 148/60 139/62 (!) 142/62 (!) 141/59  Pulse: 74 75 69 65  Resp: '19 20 14 19  '$ Temp:  97.8 F (36.6 C)  98 F (36.7 C)  TempSrc:  Oral  Oral  SpO2: 96% 91% 97% 95%  Weight:   98.2 kg   Height:        Intake/Output Summary (Last 24 hours) at 04/21/2021 1528 Last data filed at 04/21/2021 1443 Gross per 24 hour  Intake 2080.45 ml  Output 1500 ml  Net 580.45 ml   Filed Weights   04/20/21 1309 04/21/21 1100  Weight: 97.1 kg 98.2 kg    Examination:  General exam: Appears calm and comfortable  Respiratory system: Clear to auscultation. Respiratory effort normal. Cardiovascular system: S1 & S2 heard, RRR. No JVD, murmurs, rubs, gallops or clicks. Gastrointestinal system: Soft, nontender, nondistended, bowel sounds positive. Central nervous system: Alert and oriented. No focal neurological deficits.Symmetric 5 x 5 power. Extremities: No edema, no cyanosis, pulses intact and symmetrical. Psychiatry: Judgement and insight appear normal. Mood & affect appropriate.    DVT prophylaxis: Heparin Code Status: Full Family Communication: Discussed with patient Disposition Plan:  Status is: Observation  The patient remains OBS appropriate and will d/c before 2 midnights.  Dispo: The patient is from: Home  Anticipated d/c is to: Home              Patient currently is not medically stable to d/c.   Difficult to place patient No               Level of care: Progressive Cardiac  All the records are reviewed and case discussed with Care Management/Social Worker. Management plans discussed with  the patient, nursing and they are in agreement.  Consultants:  Cardiology  Procedures:  Antimicrobials:   Data Reviewed: I have personally reviewed following labs and imaging studies  CBC: Recent Labs  Lab 04/20/21 1312 04/21/21 0608  WBC 6.1 5.7  HGB 14.1 12.8*  HCT 38.7* 36.6*  MCV 92.8 93.1  PLT 161 [id number]   Basic Metabolic Panel: Recent Labs  Lab 04/20/21 1312 04/21/21 0608  NA 136 139  K 4.1 3.7  CL 103 106  CO2 27 27  GLUCOSE 152* 89  BUN 37* 30*  CREATININE 1.41* 1.15  CALCIUM 9.0 8.5*   GFR: Estimated Creatinine Clearance: 62.2 mL/min (by C-G formula based on SCr of 1.15 mg/dL). Liver Function Tests: No results for input(s): AST, ALT, ALKPHOS, BILITOT, PROT, ALBUMIN in the last 168 hours. No results for input(s): LIPASE, AMYLASE in the last 168 hours. No results for input(s): AMMONIA in the last 168 hours. Coagulation Profile: Recent Labs  Lab 04/20/21 2305  INR 1.2   Cardiac Enzymes: No results for input(s): CKTOTAL, CKMB, CKMBINDEX, TROPONINI in the last 168 hours. BNP (last 3 results) No results for input(s): PROBNP in the last 8760 hours. HbA1C: No results for input(s): HGBA1C in the last 72 hours. CBG: No results for input(s): GLUCAP in the last 168 hours. Lipid Profile: No results for input(s): CHOL, HDL, LDLCALC, TRIG, CHOLHDL, LDLDIRECT in the last 72 hours. Thyroid Function Tests: No results for input(s): TSH, T4TOTAL, FREET4, T3FREE, THYROIDAB in the last 72 hours. Anemia Panel: No results for input(s): VITAMINB12, FOLATE, FERRITIN, TIBC, IRON, RETICCTPCT in the last 72 hours. Sepsis Labs: No results for input(s): PROCALCITON, LATICACIDVEN in the last 168 hours.  Recent Results (from the past 240 hour(s))  Resp Panel by RT-PCR (Flu A&B, Covid) Nasopharyngeal Swab     Status: None   Collection Time: 04/20/21 11:05 PM   Specimen: Nasopharyngeal Swab; Nasopharyngeal(NP) swabs in vial transport medium  Result Value Ref Range Status    SARS Coronavirus 2 by RT PCR NEGATIVE NEGATIVE Final    Comment: (NOTE) SARS-CoV-2 target nucleic acids are NOT DETECTED.  The SARS-CoV-2 RNA is generally detectable in upper respiratory specimens during the acute phase of infection. The lowest concentration of SARS-CoV-2 viral copies this assay can detect is 138 copies/mL. A negative result does not preclude SARS-Cov-2 infection and should not be used as the sole basis for treatment or other patient management decisions. A negative result may occur with  improper specimen collection/handling, submission of specimen other than nasopharyngeal swab, presence of viral mutation(s) within the areas targeted by this assay, and inadequate number of viral copies(<138 copies/mL). A negative result must be combined with clinical observations, patient history, and epidemiological information. The expected result is Negative.  Fact Sheet for Patients:  EntrepreneurPulse.com.au  Fact Sheet for Healthcare Providers:  IncredibleEmployment.be  This test is no t yet approved or cleared by the [place] FDA and  has been authorized for detection and/or diagnosis of SARS-CoV-2 by FDA under an Emergency Use Authorization (EUA). This EUA will remain  in effect (meaning this test can be  used) for the duration of the COVID-19 declaration under Section 564(b)(1) of the Act, 21 U.S.C.section 360bbb-3(b)(1), unless the authorization is terminated  or revoked sooner.       Influenza A by PCR NEGATIVE NEGATIVE Final   Influenza B by PCR NEGATIVE NEGATIVE Final    Comment: (NOTE) The Xpert Xpress SARS-CoV-2/FLU/RSV plus assay is intended as an aid in the diagnosis of influenza from Nasopharyngeal swab specimens and should not be used as a sole basis for treatment. Nasal washings and aspirates are unacceptable for Xpert Xpress SARS-CoV-2/FLU/RSV testing.  Fact Sheet for  Patients: EntrepreneurPulse.com.au  Fact Sheet for Healthcare Providers: IncredibleEmployment.be  This test is not yet approved or cleared by the [place] FDA and has been authorized for detection and/or diagnosis of SARS-CoV-2 by FDA under an Emergency Use Authorization (EUA). This EUA will remain in effect (meaning this test can be used) for the duration of the COVID-19 declaration under Section 564(b)(1) of the Act, 21 U.S.C. section 360bbb-3(b)(1), unless the authorization is terminated or revoked.  Performed at [facility], [address], [place], [place] [address]      Radiology Studies: DG Chest 2 View  Result Date: 04/20/2021 CLINICAL DATA:  Chest pain with shortness of breath over the past 2 weeks. EXAM: CHEST - 2 VIEW COMPARISON:  07/16/2020 FINDINGS: Prior median sternotomy. Cervical spine fixation. Midline trachea. Mild cardiomegaly. Biapical pleural thickening. No pleural effusion or pneumothorax. Left lower lobe scarring is mild, similar to on the prior. There may also be scarring within the lateral right upper lobe. No congestive failure. No lobar consolidation. IMPRESSION: 1. No acute cardiopulmonary disease. 2. Cardiomegaly without congestive failure. Electronically Signed   By: Abigail Miyamoto M.D.   On: 04/20/2021 13:58   CT Angio Chest PE W and/or Wo Contrast  Result Date: 04/20/2021 CLINICAL DATA:  PE suspected, high prob. Intermittent chest pain with increasing dyspnea EXAM: CT ANGIOGRAPHY CHEST WITH CONTRAST TECHNIQUE: Multidetector CT imaging of the chest was performed using the standard protocol during bolus administration of intravenous contrast. Multiplanar CT image reconstructions and MIPs were obtained to evaluate the vascular anatomy. CONTRAST:  24m OMNIPAQUE IOHEXOL 350 MG/ML SOLN COMPARISON:  12/18/2019 FINDINGS: Cardiovascular: There is adequate opacification of the pulmonary arterial tree. No intraluminal  filling defect identified to suggest acute pulmonary embolism through the segmental level. The central pulmonary arteries are of normal caliber. Coronary artery bypass grafting has been performed. Extensive calcification is seen within the native coronary arteries. Global cardiac size is within normal limits. No pericardial effusion. The thoracic aorta is of normal caliber and demonstrates moderate mixed atherosclerotic plaque. There is mild plaque ulceration noted involving atheromatous plaque along the ventral surface of the aortic arch, best appreciated on axial image # 34/4 and sagittal reformat # 76/8. There is no mural penetration noted. No periaortic inflammatory change. Mediastinum/Nodes: No enlarged mediastinal, hilar, or axillary lymph nodes. Thyroid gland, trachea, and esophagus demonstrate no significant findings. Lungs/Pleura: Mild emphysema. Bibasilar parenchymal scarring appears stable since prior examination, likely post inflammatory in nature. No pneumothorax or pleural effusion. No superimposed focal pulmonary infiltrate. The central airways are widely patent. Upper Abdomen: Prominent atheromatous plaque is seen within the abdominal aorta at the level of the diaphragmatic hiatus. No acute abnormality. Musculoskeletal: No acute bone abnormality. No lytic or blastic bone lesion. Review of the MIP images confirms the above findings. IMPRESSION: No pulmonary embolism.  No acute intrathoracic pathology identified. Mild emphysema. Stable bibasilar parenchymal scarring, likely post inflammatory in nature. 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 Clinical correlation for evidence of peripheral thromboembolism (blue toe syndrome, etc.) may be helpful for further management. Aortic Atherosclerosis (ICD10-I70.0) and Emphysema (ICD10-J43.9).  Electronically Signed   By: Fidela Salisbury M.D.   On: 04/20/2021 20:23    Scheduled Meds:  aspirin EC  81 mg Oral Daily   ezetimibe  10 mg Oral Daily   fesoterodine  4 mg Oral Daily   furosemide  20 mg Oral QODAY   gabapentin  300 mg Oral TID   [START ON 04/22/2021] influenza vaccine adjuvanted  0.5 mL Intramuscular Tomorrow-1000   isosorbide mononitrate  60 mg Oral QHS   losartan  25 mg Oral Daily   metoprolol succinate  12.5 mg Oral Daily   pantoprazole  40 mg Oral Daily   ranolazine  500 mg Oral BID   simvastatin  40 mg Oral q1800   tamsulosin  0.4 mg Oral QHS   vitamin B-12  1,000 mcg Oral Daily   Continuous Infusions:   LOS: 0 days   Time spent: 40 minutes. More than 50% of the time was spent in counseling/coordination of care  Lorella Nimrod, MD [facility]  If 7PM-7AM, please contact night-coverage [email or web address]  04/21/2021, 3:28 PM   This record has been created using Systems analyst. Errors have been sought and corrected,but may not always be located. Such creation errors do not reflect on the standard of care.

## 2021-04-21 NOTE — ED Notes (Signed)
Pt took his home meds tonight while in the ER

## 2021-04-22 ENCOUNTER — Observation Stay (HOSPITAL_BASED_OUTPATIENT_CLINIC_OR_DEPARTMENT_OTHER): Payer: PPO

## 2021-04-22 DIAGNOSIS — I200 Unstable angina: Secondary | ICD-10-CM

## 2021-04-22 DIAGNOSIS — R079 Chest pain, unspecified: Secondary | ICD-10-CM | POA: Diagnosis not present

## 2021-04-22 DIAGNOSIS — I2510 Atherosclerotic heart disease of native coronary artery without angina pectoris: Secondary | ICD-10-CM | POA: Diagnosis not present

## 2021-04-22 DIAGNOSIS — J432 Centrilobular emphysema: Secondary | ICD-10-CM | POA: Diagnosis not present

## 2021-04-22 DIAGNOSIS — I25118 Atherosclerotic heart disease of native coronary artery with other forms of angina pectoris: Secondary | ICD-10-CM

## 2021-04-22 DIAGNOSIS — R0781 Pleurodynia: Secondary | ICD-10-CM

## 2021-04-22 DIAGNOSIS — R0600 Dyspnea, unspecified: Secondary | ICD-10-CM | POA: Diagnosis not present

## 2021-04-22 DIAGNOSIS — E785 Hyperlipidemia, unspecified: Secondary | ICD-10-CM | POA: Diagnosis not present

## 2021-04-22 DIAGNOSIS — I10 Essential (primary) hypertension: Secondary | ICD-10-CM | POA: Diagnosis not present

## 2021-04-22 DIAGNOSIS — I5030 Unspecified diastolic (congestive) heart failure: Secondary | ICD-10-CM | POA: Diagnosis not present

## 2021-04-22 LAB — NM MYOCAR MULTI W/SPECT W/WALL MOTION / EF
LV dias vol: 90 mL (ref 62–150)
LV sys vol: 35 mL
Nuc Stress EF: 61 %
Peak HR: 102 {beats}/min
Percent HR: 72 %
Rest Nuclear Isotope Dose: 10 mCi
SDS: 1
SRS: 6
SSS: 4
ST Depression (mm): 0 mm
Stress Nuclear Isotope Dose: 31.5 mCi
TID: 1.17

## 2021-04-22 MED ORDER — TECHNETIUM TC 99M TETROFOSMIN IV KIT
10.0000 | PACK | Freq: Once | INTRAVENOUS | Status: AC | PRN
  Administered 2021-04-22: 9.99 via INTRAVENOUS

## 2021-04-22 MED ORDER — REGADENOSON 0.4 MG/5ML IV SOLN
0.4000 mg | Freq: Once | INTRAVENOUS | Status: AC
  Administered 2021-04-22: 0.4 mg via INTRAVENOUS

## 2021-04-22 MED ORDER — ATORVASTATIN CALCIUM 20 MG PO TABS: 20.0000 mg | ORAL_TABLET | Freq: Every day | ORAL | 0 refills | Status: DC

## 2021-04-22 MED ORDER — TECHNETIUM TC 99M TETROFOSMIN IV KIT
30.0000 | PACK | Freq: Once | INTRAVENOUS | Status: AC
  Administered 2021-04-22: 31.47 via INTRAVENOUS

## 2021-04-22 NOTE — Discharge Summary (Signed)
Physician Discharge Summary  [name] [id number] DOB: [birth year] DOA: 04/20/2021  PCP: Ria Bush, MD  Admit date: 04/20/2021 Discharge date: 04/22/2021  Admitted From: Home Disposition: Home  Recommendations for Outpatient Follow-up:  Follow up with PCP in 1-2 weeks Follow-up with cardiology in 1 to 2 weeks Follow-up with pulmonology in 1 to 2 weeks Please obtain BMP/CBC in one week Please follow up on the following pending results: None  Home Health: No Equipment/Devices: None Discharge Condition: Stable CODE STATUS: Full Diet recommendation: Heart Healthy / Carb Modified   Brief/Interim Summary: [name] is a 80 y.o. male with medical history significant for coronary artery disease status post three-vessel CABG, status post PCI and stent, CHF, DVT, hypertension, dyslipidemia and obstructive sleep apnea, who presented to the ER with acute onset of exertional chest pain which has been going on over the last couple weeks with associated chest discomfort in the midsternal area. He was also having worsening dyspnea, recently started on Lasix by his cardiologist with no improvement. On arrival he was hemodynamically stable, negative troponin, BNP within normal limit.  CT chest with no PE but did show some emphysema.  EKG with some T wave inversions in lateral leads. Admitted for concern of unstable angina and cardiology was consulted. He was also started on heparin infusion which was later discontinued by cardiology. Cardiology increase the dose of Imdur and he underwent Myoview which was low risk for any recent ischemia.  Cardiology is recommending continuation of current medications which include Lasix every other day and close follow-up with cardiology for further management.  No further testing needed at this time.  Her shortness of breath can be multifactorial with emphysema, pulmonary hypertension and diastolic heart failure.  Patient will continue rest of  his home medications and follow-up with his providers.  Discharge Diagnoses:  Active Problems:   Pleuritic chest pain   Unstable angina (HCC)   Coronary artery disease involving native coronary artery of native heart   Primary hypertension   Discharge Instructions  Discharge Instructions     (HEART FAILURE PATIENTS) Call MD:  Anytime you have any of the following symptoms: 1) 3 pound weight gain in 24 hours or 5 pounds in 1 week 2) shortness of breath, with or without a dry hacking cough 3) swelling in the hands, feet or stomach 4) if you have to sleep on extra pillows at night in order to breathe.   Complete by: As directed    Diet - low sodium heart healthy   Complete by: As directed    Discharge instructions   Complete by: As directed    It was pleasure taking care of you. Your stress test is within normal limits, continue taking your medications which includes Lasix every other day and follow-up very closely with your cardiologist for further recommendations. You also need to follow-up very closely with your pulmonologist as you have emphysema and increased pressure in your lungs which can be contributory to your shortness of breath.   Increase activity slowly   Complete by: As directed       Allergies as of 04/22/2021       Reactions   Vioxx [rofecoxib] Other (See Comments)   Hemorrhage    Spiriva Respimat [tiotropium Bromide Monohydrate] Other (See Comments)   Elevated bp   Contrast Media [iodinated Diagnostic Agents] Itching, Rash   Delayed reaction post abdominal aortagram.    Morphine Nausea Only, Other (See Comments)   Irritability  Medication List     STOP taking these medications    simvastatin 40 MG tablet Commonly known as: ZOCOR       TAKE these medications    acetaminophen 500 MG tablet Commonly known as: TYLENOL Take 500 mg by mouth every 8 (eight) hours as needed.   aspirin 81 MG EC tablet Take 1 tablet (81 mg total) by mouth daily.    atorvastatin 20 MG tablet Commonly known as: LIPITOR Take 1 tablet (20 mg total) by mouth daily.   B-12 1000 MCG Subl Place 1 tablet under the tongue daily.   cyclobenzaprine 5 MG tablet Commonly known as: FLEXERIL Take 5 mg by mouth 3 (three) times daily as needed.   docusate sodium 100 MG capsule Commonly known as: COLACE Take 100 mg by mouth 2 (two) times daily as needed for mild constipation.   ezetimibe 10 MG tablet Commonly known as: ZETIA Take 1 tablet (10 mg total) by mouth daily.   fluticasone 50 MCG/ACT nasal spray Commonly known as: FLONASE Place into both nostrils as needed for allergies or rhinitis.   furosemide 20 MG tablet Commonly known as: LASIX Take 1 tablet (20 mg total) by mouth every other day.   gabapentin 300 MG capsule Commonly known as: NEURONTIN TAKE 1 CAPSULE BY MOUTH THREE TIMES DAILY   hydrocortisone 2.5 % lotion Apply topically daily as needed.   isosorbide mononitrate 30 MG 24 hr tablet Commonly known as: IMDUR Take 1 tablet (30 mg total) by mouth at bedtime.   losartan 25 MG tablet Commonly known as: COZAAR Take 1 tablet (25 mg total) by mouth daily.   metoprolol succinate 25 MG 24 hr tablet Commonly known as: TOPROL-XL Take 0.5 tablets (12.5 mg total) by mouth daily.   nitroGLYCERIN 0.4 MG SL tablet Commonly known as: NITROSTAT Place 1 tablet (0.4 mg total) under the tongue every 5 (five) minutes as needed for chest pain.   omeprazole 20 MG capsule Commonly known as: PRILOSEC TAKE 1 CAPSULE EVERY DAY   polyethylene glycol 17 g packet Commonly known as: MIRALAX / GLYCOLAX Take 17 g by mouth daily as needed for mild constipation.   ProAir HFA 108 (90 Base) MCG/ACT inhaler Generic drug: albuterol TAKE 2 PUFFS INTO LUNGS EVERY 6 HOURS ASNEEDED FOR WHEEZING OR SHORTNESS OF BREATH   ranolazine 500 MG 12 hr tablet Commonly known as: RANEXA Take 1 tablet (500 mg total) by mouth 2 (two) times daily.   tamsulosin 0.4 MG Caps  capsule Commonly known as: FLOMAX Take 0.4 mg by mouth at bedtime.   tolterodine 4 MG 24 hr capsule Commonly known as: DETROL LA Take 4 mg by mouth daily.        Follow-up Information     Ria Bush, MD. Schedule an appointment as soon as possible for a visit.   Specialty: Family Medicine Contact information: [place] [address] [phone]         [name], MD .   Specialty: Cardiology Contact information: [place] [address] [phone]                Allergies  Allergen Reactions   Vioxx [Rofecoxib] Other (See Comments)    Hemorrhage    Spiriva Respimat [Tiotropium Bromide Monohydrate] Other (See Comments)    Elevated bp   Contrast Media [Iodinated Diagnostic Agents] Itching and Rash    Delayed reaction post abdominal aortagram.    Morphine Nausea Only and  Other (See Comments)    Irritability     Consultations: Cardiology  Procedures/Studies: DG Chest 2 View  Result Date: 04/20/2021 CLINICAL DATA:  Chest pain with shortness of breath over the past 2 weeks. EXAM: CHEST - 2 VIEW COMPARISON:  07/16/2020 FINDINGS: Prior median sternotomy. Cervical spine fixation. Midline trachea. Mild cardiomegaly. Biapical pleural thickening. No pleural effusion or pneumothorax. Left lower lobe scarring is mild, similar to on the prior. There may also be scarring within the lateral right upper lobe. No congestive failure. No lobar consolidation. IMPRESSION: 1. No acute cardiopulmonary disease. 2. Cardiomegaly without congestive failure. Electronically Signed   By: Abigail Miyamoto M.D.   On: 04/20/2021 13:58   CT Angio Chest PE W and/or Wo Contrast  Result Date: 04/20/2021 CLINICAL DATA:  PE suspected, high prob. Intermittent chest pain with increasing dyspnea EXAM: CT ANGIOGRAPHY CHEST WITH CONTRAST TECHNIQUE: Multidetector CT imaging of the chest was performed using the standard protocol during bolus  administration of intravenous contrast. Multiplanar CT image reconstructions and MIPs were obtained to evaluate the vascular anatomy. CONTRAST:  28m OMNIPAQUE IOHEXOL 350 MG/ML SOLN COMPARISON:  12/18/2019 FINDINGS: Cardiovascular: There is adequate opacification of the pulmonary arterial tree. No intraluminal filling defect identified to suggest acute pulmonary embolism through the segmental level. The central pulmonary arteries are of normal caliber. Coronary artery bypass grafting has been performed. Extensive calcification is seen within the native coronary arteries. Global cardiac size is within normal limits. No pericardial effusion. The thoracic aorta is of normal caliber and demonstrates moderate mixed atherosclerotic plaque. There is mild plaque ulceration noted involving atheromatous plaque along the ventral surface of the aortic arch, best appreciated on axial image # 34/4 and sagittal reformat # 76/8. There is no mural penetration noted. No periaortic inflammatory change. Mediastinum/Nodes: No enlarged mediastinal, hilar, or axillary lymph nodes. Thyroid gland, trachea, and esophagus demonstrate no significant findings. Lungs/Pleura: Mild emphysema. Bibasilar parenchymal scarring appears stable since prior examination, likely post inflammatory in nature. No pneumothorax or pleural effusion. No superimposed focal pulmonary infiltrate. The central airways are widely patent. Upper Abdomen: Prominent atheromatous plaque is seen within the abdominal aorta at the level of the diaphragmatic hiatus. No acute abnormality. Musculoskeletal: No acute bone abnormality. No lytic or blastic bone lesion. Review of the MIP images confirms the above findings. IMPRESSION: No pulmonary embolism.  No acute intrathoracic pathology identified. Mild emphysema. Stable bibasilar parenchymal scarring, likely post inflammatory in nature. Peripheral vascular disease with ulcerated atheromatous plaque within the aortic arch and  prominent coral-like atheromatous plaque within the abdominal aorta at the level of the diaphragmatic hiatus. Clinical correlation for evidence of peripheral thromboembolism (blue toe syndrome, etc.) may be helpful for further management. Aortic Atherosclerosis (ICD10-I70.0) and Emphysema (ICD10-J43.9). Electronically Signed   By: AFidela SalisburyM.D.   On: 04/20/2021 20:23   Pulmonary Function Test [facility] Only  Result Date: 03/24/2021 Spirometry Data Is Acceptable and Reproducible MILD-Moderate Obstructive Airways Disease without Significant Broncho-Dilator Response MILD Moderate Restrictive Lung disease Consider outpatient Pulmonary Consultation if needed Clinical Correlation Advised    Subjective: Patient denies any chest pain or shortness of breath.  Waiting for stress test.  Discharge Exam: Vitals:   04/22/21 0831 04/22/21 1201  BP: 133/69 (!) 139/93  Pulse: 64 79  Resp: 18 18  Temp: (!) 97.5 F (36.4 C) 97.7 F (36.5 C)  SpO2: 96% 96%   Vitals:   04/22/21 0034 04/22/21 0600 04/22/21 0831 04/22/21 1201  BP: (!) 129/43 (!) 146/65 133/69 (!) 139/93  Pulse: 61  64 79  Resp: '20 20 18 18  '$ Temp: 97.6 F (36.4 C) 97.6 F (36.4 C) (!) 97.5 F (36.4 C) 97.7 F (36.5 C)  TempSrc: Oral Oral Oral Oral  SpO2: 95% 97% 96% 96%  Weight:      Height:        General: Pt is alert, awake, not in acute distress Cardiovascular: RRR, S1/S2 +, no rubs, no gallops Respiratory: CTA bilaterally, no wheezing, no rhonchi Abdominal: Soft, NT, ND, bowel sounds + Extremities: no edema, no cyanosis   The results of significant diagnostics from this hospitalization (including imaging, microbiology, ancillary and laboratory) are listed below for reference.    Microbiology: Recent Results (from the past 240 hour(s))  Resp Panel by RT-PCR (Flu A&B, Covid) Nasopharyngeal Swab     Status: None   Collection Time: 04/20/21 11:05 PM   Specimen: Nasopharyngeal Swab; Nasopharyngeal(NP) swabs in vial transport  medium  Result Value Ref Range Status   SARS Coronavirus 2 by RT PCR NEGATIVE NEGATIVE Final    Comment: (NOTE) SARS-CoV-2 target nucleic acids are NOT DETECTED.  The SARS-CoV-2 RNA is generally detectable in upper respiratory specimens during the acute phase of infection. The lowest concentration of SARS-CoV-2 viral copies this assay can detect is 138 copies/mL. A negative result does not preclude SARS-Cov-2 infection and should not be used as the sole basis for treatment or other patient management decisions. A negative result may occur with  improper specimen collection/handling, submission of specimen other than nasopharyngeal swab, presence of viral mutation(s) within the areas targeted by this assay, and inadequate number of viral copies(<138 copies/mL). A negative result must be combined with clinical observations, patient history, and epidemiological information. The expected result is Negative.  Fact Sheet for Patients:  EntrepreneurPulse.com.au  Fact Sheet for Healthcare Providers:  IncredibleEmployment.be  This test is no t yet approved or cleared by the [place] FDA and  has been authorized for detection and/or diagnosis of SARS-CoV-2 by FDA under an Emergency Use Authorization (EUA). This EUA will remain  in effect (meaning this test can be used) for the duration of the COVID-19 declaration under Section 564(b)(1) of the Act, 21 U.S.C.section 360bbb-3(b)(1), unless the authorization is terminated  or revoked sooner.       Influenza A by PCR NEGATIVE NEGATIVE Final   Influenza B by PCR NEGATIVE NEGATIVE Final    Comment: (NOTE) The Xpert Xpress SARS-CoV-2/FLU/RSV plus assay is intended as an aid in the diagnosis of influenza from Nasopharyngeal swab specimens and should not be used as a sole basis for treatment. Nasal washings and aspirates are unacceptable for Xpert Xpress SARS-CoV-2/FLU/RSV testing.  Fact Sheet for  Patients: EntrepreneurPulse.com.au  Fact Sheet for Healthcare Providers: IncredibleEmployment.be  This test is not yet approved or cleared by the [place] FDA and has been authorized for detection and/or diagnosis of SARS-CoV-2 by FDA under an Emergency Use Authorization (EUA). This EUA will remain in effect (meaning this test can be used) for the duration of the COVID-19 declaration under Section 564(b)(1) of the Act, 21 U.S.C. section 360bbb-3(b)(1), unless the authorization is terminated or revoked.  Performed at [facility], [address] [place], [place] [address]      Labs: BNP (last 3 results) Recent Labs    07/16/20 0254 04/20/21 2305  BNP 178.6* 123456   Basic Metabolic Panel: Recent Labs  Lab 04/20/21 1312 04/21/21 0608  NA 136 139  K 4.1 3.7  CL 103 106  CO2 27  27  GLUCOSE 152* 89  BUN 37* 30*  CREATININE 1.41* 1.15  CALCIUM 9.0 8.5*   Liver Function Tests: No results for input(s): AST, ALT, ALKPHOS, BILITOT, PROT, ALBUMIN in the last 168 hours. No results for input(s): LIPASE, AMYLASE in the last 168 hours. No results for input(s): AMMONIA in the last 168 hours. CBC: Recent Labs  Lab 04/20/21 1312 04/21/21 0608  WBC 6.1 5.7  HGB 14.1 12.8*  HCT 38.7* 36.6*  MCV 92.8 93.1  PLT 161 150   Cardiac Enzymes: No results for input(s): CKTOTAL, CKMB, CKMBINDEX, TROPONINI in the last 168 hours. BNP: Invalid input(s): POCBNP CBG: No results for input(s): GLUCAP in the last 168 hours. D-Dimer No results for input(s): DDIMER in the last 72 hours. Hgb A1c No results for input(s): HGBA1C in the last 72 hours. Lipid Profile No results for input(s): CHOL, HDL, LDLCALC, TRIG, CHOLHDL, LDLDIRECT in the last 72 hours. Thyroid function studies No results for input(s): TSH, T4TOTAL, T3FREE, THYROIDAB in the last 72 hours.  Invalid input(s): FREET3 Anemia work up No results for input(s): VITAMINB12,  FOLATE, FERRITIN, TIBC, IRON, RETICCTPCT in the last 72 hours. Urinalysis    Component Value Date/Time   COLORURINE yellow 07/02/2008 0832   APPEARANCEUR Clear 07/02/2008 0832   LABSPEC 1.015 07/02/2008 0832   PHURINE 5.5 07/02/2008 0832   HGBUR negative 07/02/2008 0832   BILIRUBINUR negative 08/21/2020 1127   KETONESUR negative 08/21/2020 1127   PROTEINUR negative 08/21/2020 1127   UROBILINOGEN negative (A) 08/21/2020 1127   UROBILINOGEN 0.2 07/02/2008 0832   NITRITE Negative 08/21/2020 1127   NITRITE negative 07/02/2008 0832   LEUKOCYTESUR Negative 08/21/2020 1127   Sepsis Labs Invalid input(s): PROCALCITONIN,  WBC,  LACTICIDVEN Microbiology Recent Results (from the past 240 hour(s))  Resp Panel by RT-PCR (Flu A&B, Covid) Nasopharyngeal Swab     Status: None   Collection Time: 04/20/21 11:05 PM   Specimen: Nasopharyngeal Swab; Nasopharyngeal(NP) swabs in vial transport medium  Result Value Ref Range Status   SARS Coronavirus 2 by RT PCR NEGATIVE NEGATIVE Final    Comment: (NOTE) SARS-CoV-2 target nucleic acids are NOT DETECTED.  The SARS-CoV-2 RNA is generally detectable in upper respiratory specimens during the acute phase of infection. The lowest concentration of SARS-CoV-2 viral copies this assay can detect is 138 copies/mL. A negative result does not preclude SARS-Cov-2 infection and should not be used as the sole basis for treatment or other patient management decisions. A negative result may occur with  improper specimen collection/handling, submission of specimen other than nasopharyngeal swab, presence of viral mutation(s) within the areas targeted by this assay, and inadequate number of viral copies(<138 copies/mL). A negative result must be combined with clinical observations, patient history, and epidemiological information. The expected result is Negative.  Fact Sheet for Patients:  EntrepreneurPulse.com.au  Fact Sheet for Healthcare  Providers:  IncredibleEmployment.be  This test is no t yet approved or cleared by the [place] FDA and  has been authorized for detection and/or diagnosis of SARS-CoV-2 by FDA under an Emergency Use Authorization (EUA). This EUA will remain  in effect (meaning this test can be used) for the duration of the COVID-19 declaration under Section 564(b)(1) of the Act, 21 U.S.C.section 360bbb-3(b)(1), unless the authorization is terminated  or revoked sooner.       Influenza A by PCR NEGATIVE NEGATIVE Final   Influenza B by PCR NEGATIVE NEGATIVE Final    Comment: (NOTE) The Xpert Xpress SARS-CoV-2/FLU/RSV plus assay is intended as an  aid in the diagnosis of influenza from Nasopharyngeal swab specimens and should not be used as a sole basis for treatment. Nasal washings and aspirates are unacceptable for Xpert Xpress SARS-CoV-2/FLU/RSV testing.  Fact Sheet for Patients: EntrepreneurPulse.com.au  Fact Sheet for Healthcare Providers: IncredibleEmployment.be  This test is not yet approved or cleared by the [place] FDA and has been authorized for detection and/or diagnosis of SARS-CoV-2 by FDA under an Emergency Use Authorization (EUA). This EUA will remain in effect (meaning this test can be used) for the duration of the COVID-19 declaration under Section 564(b)(1) of the Act, 21 U.S.C. section 360bbb-3(b)(1), unless the authorization is terminated or revoked.  Performed at [facility], [address] [place], [place] [address]     Time coordinating discharge: Over 30 minutes  SIGNED:  Lorella Nimrod, MD  [facility] 04/22/2021, 1:21 PM  If 7PM-7AM, please contact night-coverage [email or web address]  This record has been created using Systems analyst. Errors have been sought and corrected,but may not always be located. Such creation errors do not reflect on the standard of care.

## 2021-04-22 NOTE — Plan of Care (Signed)
  Problem: Education: Goal: Knowledge of General Education information will improve Description: Including pain rating scale, medication(s)/side effects and non-pharmacologic comfort measures Outcome: Progressing   Problem: Clinical Measurements: Goal: Respiratory complications will improve Outcome: Progressing   Problem: Activity: Goal: Risk for activity intolerance will decrease Outcome: Progressing   Problem: Pain Managment: Goal: General experience of comfort will improve Outcome: Progressing   

## 2021-04-22 NOTE — Progress Notes (Signed)
Discharge instructions explained to pt and pts wife/ verbalized an understanding/ iv and tele removed/ will transport off  unit via wheelchair.

## 2021-04-22 NOTE — Progress Notes (Signed)
Progress Note  Patient Name: [name] Date of Encounter: 04/22/2021  Primary Cardiologist: Rockey Situ  Subjective   No chest pain or dyspnea.  He is for [facility] MPI this morning.  He has ruled out.  Inpatient Medications    Scheduled Meds:  aspirin EC  81 mg Oral Daily   atorvastatin  20 mg Oral Daily   ezetimibe  10 mg Oral Daily   fesoterodine  4 mg Oral Daily   furosemide  20 mg Oral QODAY   gabapentin  300 mg Oral TID   influenza vaccine adjuvanted  0.5 mL Intramuscular Tomorrow-1000   isosorbide mononitrate  60 mg Oral QHS   losartan  25 mg Oral Daily   metoprolol succinate  12.5 mg Oral Daily   pantoprazole  40 mg Oral Daily   ranolazine  500 mg Oral BID   tamsulosin  0.4 mg Oral QHS   vitamin B-12  1,000 mcg Oral Daily   Continuous Infusions:  PRN Meds: acetaminophen **OR** acetaminophen, albuterol, cyclobenzaprine, docusate sodium, fluticasone, magnesium hydroxide, nitroGLYCERIN, ondansetron **OR** ondansetron (ZOFRAN) IV, polyethylene glycol, traZODone   Vital Signs    Vitals:   04/21/21 2002 04/22/21 0034 04/22/21 0600 04/22/21 0831  BP: (!) 148/66 (!) 129/43 (!) 146/65 133/69  Pulse: (!) 55 61  64  Resp: '20 20 20 18  '$ Temp: 98 F (36.7 C) 97.6 F (36.4 C) 97.6 F (36.4 C) (!) 97.5 F (36.4 C)  TempSrc: Oral Oral Oral Oral  SpO2: 96% 95% 97% 96%  Weight:      Height:        Intake/Output Summary (Last 24 hours) at 04/22/2021 1125 Last data filed at 04/22/2021 0838 Gross per 24 hour  Intake 1523.07 ml  Output 2040 ml  Net -516.93 ml   Filed Weights   04/20/21 1309 04/21/21 1100  Weight: 97.1 kg 98.2 kg    Telemetry    SR - Personally Reviewed  ECG    No new tracings- Personally Reviewed  Physical Exam   GEN: No acute distress.   Neck: No JVD. Cardiac: RRR, no murmurs, rubs, or gallops.  Respiratory: Clear to auscultation bilaterally.  GI: Soft, nontender, non-distended.   MS: No edema; No deformity. Neuro:  Alert and  oriented x 3; Nonfocal.  Psych: Normal affect.  Labs    Chemistry Recent Labs  Lab 04/20/21 1312 04/21/21 0608  NA 136 139  K 4.1 3.7  CL 103 106  CO2 27 27  GLUCOSE 152* 89  BUN 37* 30*  CREATININE 1.41* 1.15  CALCIUM 9.0 8.5*  GFRNONAA 50* >60  ANIONGAP 6 6     Hematology Recent Labs  Lab 04/20/21 1312 04/21/21 0608  WBC 6.1 5.7  RBC 4.17* 3.93*  HGB 14.1 12.8*  HCT 38.7* 36.6*  MCV 92.8 93.1  MCH 33.8 32.6  MCHC 36.4* 35.0  RDW 11.9 11.9  PLT 161 150    Cardiac EnzymesNo results for input(s): TROPONINI in the last 168 hours. No results for input(s): TROPIPOC in the last 168 hours.   BNP Recent Labs  Lab 04/20/21 2305  BNP 71.3     DDimer No results for input(s): DDIMER in the last 168 hours.   Radiology    DG Chest 2 View  Result Date: 04/20/2021 IMPRESSION: 1. No acute cardiopulmonary disease. 2. Cardiomegaly without congestive failure. Electronically Signed   By: Abigail Miyamoto M.D.   On: 04/20/2021 13:58   CT Angio Chest PE W and/or Wo Contrast  Result Date: 04/20/2021 IMPRESSION: No pulmonary embolism.  No acute intrathoracic pathology identified. Mild emphysema. Stable bibasilar parenchymal scarring, likely post inflammatory in nature. Peripheral vascular disease with ulcerated atheromatous plaque within the aortic arch and prominent coral-like atheromatous plaque within the abdominal aorta at the level of the diaphragmatic hiatus. Clinical correlation for evidence of peripheral thromboembolism (blue toe syndrome, etc.) may be helpful for further management. Aortic Atherosclerosis (ICD10-I70.0) and Emphysema (ICD10-J43.9). Electronically Signed   By: Fidela Salisbury M.D.   On: 04/20/2021 20:23    Cardiac Studies   Lexiscan MPI 04/22/2021: Pending __________  2D echo 03/23/2021:  1. Left ventricular ejection fraction, by estimation, is 50 to 55%. The  left ventricle has low normal function. The left ventricle has no regional  wall motion  abnormalities. There is mild left ventricular hypertrophy.  Left ventricular diastolic  parameters are consistent with Grade I diastolic dysfunction (impaired  relaxation).   2. Right ventricular systolic function is normal. The right ventricular  size is normal. There is mildly elevated pulmonary artery systolic  pressure. The estimated right ventricular systolic pressure is 123456 mmHg.   3. Left atrial size was mild to moderately dilated.   4. The mitral valve is normal in structure. Trivial mitral valve  regurgitation. No evidence of mitral stenosis.   5. The aortic valve is normal in structure. Aortic valve regurgitation is  not visualized. Mild to moderate aortic valve sclerosis/calcification is  present, without any evidence of aortic stenosis.   Patient Profile     80 y.o. male with history of CAD status post CABG in 1990 status post PCI in 2002, HFrEF post COVID with subsequent normalization of LVSF by echo in [id number], diastolic dysfunction, pulmonary hypertension, COPD, PAD, DM2, CKD stage III, carotid artery stenosis, HTN, and HLD who we are seeing for dyspnea.  Assessment & Plan    1.  CAD status post CABG status post also point PCI with exertional dyspnea: -Never with chest pain -Symptoms do not feel like prior angina -He has ruled out -Lexiscan MPI today to evaluate for high risk ischemia -Continue titrated dose of isosorbide mononitrate 60 mg daily along with ranolazine, aspirin, Toprol-XL, simvastatin, and ezetimibe  2.  HFpEF/pulmonary hypertension: -Appears relatively euvolemic on exam with recent echo demonstrating mildly elevated RVSP at 44.7 mmHg -Elevated PA pressures likely in the setting of underlying pulmonary disease -He was noted to have some degree of mild acute on CKD upon presentation following the transition of furosemide 20 mg every other day to daily dosing -Continue Lasix 20 mg every other day  3.  HTN: -Blood pressure reasonably controlled -Continue  current medical therapy  4.  HLD: -LDL 62 in 02/2021 -PTA simvastatin and ezetimibe  5.  Dyspnea: -Likely multifactorial including underlying COPD/emphysema in conjunction with diastolic CHF/pulmonary hypertension -Ischemic evaluation as outlined above -Recommend he follow-up with pulmonology as well  For questions or updates, please contact [place] Please consult [email or web address] for contact info under Cardiology/STEMI.    Signed, Christell Faith, PA-C [place] Pager: [phone] 04/22/2021, 11:25 AM

## 2021-04-26 NOTE — Telephone Encounter (Signed)
  Encourage patient to contact the pharmacy for refills or they can request refills through [facility]  LAST APPOINTMENT DATE:  Please schedule appointment if longer than 1 year  NEXT APPOINTMENT DATE:  MEDICATION:  Is the patient out of medication?   PHARMACY:  Let patient know to contact pharmacy at the end of the day to make sure medication is ready.  Please notify patient to allow 48-72 hours to process  CLINICAL FILLS OUT ALL BELOW:   LAST REFILL:  QTY:  REFILL DATE:    OTHER COMMENTS:    Okay for refill?  Please advise     

## 2021-04-28 DIAGNOSIS — M25552 Pain in left hip: Secondary | ICD-10-CM | POA: Diagnosis not present

## 2021-04-29 ENCOUNTER — Telehealth: Payer: Self-pay

## 2021-04-29 ENCOUNTER — Ambulatory Visit (INDEPENDENT_AMBULATORY_CARE_PROVIDER_SITE_OTHER): Payer: PPO

## 2021-04-29 ENCOUNTER — Other Ambulatory Visit: Payer: Self-pay

## 2021-04-29 DIAGNOSIS — I25118 Atherosclerotic heart disease of native coronary artery with other forms of angina pectoris: Secondary | ICD-10-CM | POA: Diagnosis not present

## 2021-04-29 DIAGNOSIS — I739 Peripheral vascular disease, unspecified: Secondary | ICD-10-CM

## 2021-04-29 NOTE — Chronic Care Management (AMB) (Addendum)
Chronic Care Management Pharmacy Assistant   Name: [name]  MRN: [id number] DOB: 12-17-1940  Reason for Encounter: Adherence Review  Recent office visits:  02/23/21-PCP-Patient presented for AWV- patient to set up dates for covid boosters,increase water intake, watch sugars, follow up with Cardiology,follow up 3 months.  Recent consult visits:  04/01/21-Urology-Patient presented for transfer of care.No medication changes,follow up 1 year  04/01/21-OP labs- normal limits 03/23/21-Pulmonology-Patient presented for follow up breathing test,check O2 level,( no elevation of allergy cells) follow up 6 weeks  03/19/21-Cardiology-Patient presented for follow up of chest tightness,SOB,labs reviewed,EKG review, no medication changes 12/24/20-Dermatology-Patient presented with skin spots-cryotherapy-recommend SPF 30+,stay in shade. 12/03/20-Cardiology-Patient presented for follow up CAD, no medication changes 11/10/20-Neurosurgery- No data found 11/05/20-Dermatology- Patient presented for skin exam.cryotherapy-Recommend daily broad spectrum sunscreen SPF 30+ to sun-exposed areas, reapply every 2 hours as needed. No medication changes  Hospital visits:  04/20/21 thru 04/22/21-[facility]-Patient presented with chest pain and SOB. Given '325mg'$  aspirin,nitropaste,IV fluids, IV Heparin,admission  Medications: Outpatient Encounter Medications as of 04/29/2021  Medication Sig   acetaminophen (TYLENOL) 500 MG tablet Take 500 mg by mouth every 8 (eight) hours as needed.   aspirin EC 81 MG EC tablet Take 1 tablet (81 mg total) by mouth daily.   atorvastatin (LIPITOR) 20 MG tablet Take 1 tablet (20 mg total) by mouth daily.   Cyanocobalamin (B-12) 1000 MCG SUBL Place 1 tablet under the tongue daily.   cyclobenzaprine (FLEXERIL) 5 MG tablet Take 5 mg by mouth 3 (three) times daily as needed.   docusate sodium (COLACE) 100 MG capsule Take 100 mg by mouth 2 (two) times daily as needed for mild constipation.    ezetimibe (ZETIA) 10 MG tablet Take 1 tablet (10 mg total) by mouth daily.   fluticasone (FLONASE) 50 MCG/ACT nasal spray Place into both nostrils as needed for allergies or rhinitis.   furosemide (LASIX) 20 MG tablet Take 1 tablet (20 mg total) by mouth every other day.   gabapentin (NEURONTIN) 300 MG capsule TAKE 1 CAPSULE BY MOUTH THREE TIMES DAILY   hydrocortisone 2.5 % lotion Apply topically daily as needed.   isosorbide mononitrate (IMDUR) 30 MG 24 hr tablet Take 1 tablet (30 mg total) by mouth at bedtime.   losartan (COZAAR) 25 MG tablet Take 1 tablet (25 mg total) by mouth daily.   metoprolol succinate (TOPROL-XL) 25 MG 24 hr tablet Take 0.5 tablets (12.5 mg total) by mouth daily.   nitroGLYCERIN (NITROSTAT) 0.4 MG SL tablet Place 1 tablet (0.4 mg total) under the tongue every 5 (five) minutes as needed for chest pain.   omeprazole (PRILOSEC) 20 MG capsule TAKE 1 CAPSULE EVERY DAY   polyethylene glycol (MIRALAX / GLYCOLAX) 17 g packet Take 17 g by mouth daily as needed for mild constipation.    PROAIR HFA 108 (90 Base) MCG/ACT inhaler TAKE 2 PUFFS INTO LUNGS EVERY 6 HOURS ASNEEDED FOR WHEEZING OR SHORTNESS OF BREATH   ranolazine (RANEXA) 500 MG 12 hr tablet Take 1 tablet (500 mg total) by mouth 2 (two) times daily.   tamsulosin (FLOMAX) 0.4 MG CAPS capsule Take 0.4 mg by mouth at bedtime.   tolterodine (DETROL LA) 4 MG 24 hr capsule Take 4 mg by mouth daily.   No facility-administered encounter medications on file as of 04/29/2021.    Contacted [name] on 05/04/21 for general disease state and medication adherence call.   Patient is not > 5 days past due for refill on the following medications  per chart history:  [facility] Medications: Medication Name/mg Last Fill Days Supply Atorvastatin '20mg'$   04/22/21  90 Losartan '25mg'$   04/09/21 90  What concerns do you have about your medications? The patient reports it took 2 weeks to get a Nitro refill. This was non emergent.  The patient  denies side effects with his medications.   How often do you forget or accidentally miss a dose? Rarely The patient reports a missed morning dose if he sleeps in.  Do you use a pillbox? Yes  Are you having any problems getting your medications from your pharmacy? No  Has the cost of your medications been a concern? No  Since last visit with CPP, no interventions have been made.  The patient has had an ED visit since last contact. Dr.Gonzalez advised the patient to go to ED to rule out blood clot. Given '325mg'$  aspirin, nitropaste, IV fluids, IV Heparin, admission 04/20/21 thru 04/22/21.  The patient reports the following problems with their health. 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 D at this time.   Counseled patient on:  Importance of taking medication daily without missed doses, Benefits of adherence packaging or a pillbox, and Access to CCM team for any cost, medication or pharmacy concerns.   Care Gaps: Annual wellness visit in last year? 02/23/21 Most Recent BP reading:143/58  80-P (04/20/21)  If Diabetic: Most recent A1C reading: 6.3 (02/16/21) Last eye exam / retinopathy screening: 02/23/21 Last diabetic foot exam: 02/23/21  PCP appointment on 05/26/21, Cardiology appointment on 05/13/21, and Pulmonology appointment with on 05/12/21   Debbora Dus, CPP notified  Avel Sensor, [place] Assistant [phone]  I have reviewed the care management and care coordination activities outlined in this encounter and I am certifying that I agree with the content of this note. No further action required.  Debbora Dus, PharmD Clinical Pharmacist [facility] [phone]

## 2021-04-30 NOTE — Telephone Encounter (Signed)
  Encourage patient to contact the pharmacy for refills or they can request refills through [place]:  Please schedule appointment if longer than 1 year  NEXT APPOINTMENT DATE:  MEDICATION:nitroGLYCERIN (NITROSTAT) 0.4 MG SL tablet  Is the patient out of medication?   [place]  Let patient know to contact pharmacy at the end of the day to make sure medication is ready.  Please notify patient to allow 48-72 hours to process  CLINICAL FILLS OUT ALL BELOW:   LAST REFILL:  QTY:  REFILL DATE:    OTHER COMMENTS:    Okay for refill?  Please advise

## 2021-04-30 NOTE — Telephone Encounter (Signed)
Looks like Dr Rockey Situ meant to refill it 03-19-21 #25/2. Will see if Dr Darnell Level wants me to contact pt to contact Dr Rockey Situ or go ahead and send in refill.

## 2021-05-01 MED ORDER — NITROGLYCERIN 0.4 MG SL SUBL: 0.4000 mg | SUBLINGUAL_TABLET | SUBLINGUAL | 3 refills | Status: DC | PRN

## 2021-05-01 NOTE — Telephone Encounter (Signed)
When it was refilled last month looks like it was sent as No Print. I went ahead and refilled.

## 2021-05-03 ENCOUNTER — Other Ambulatory Visit: Payer: Self-pay | Admitting: Cardiovascular Disease

## 2021-05-03 ENCOUNTER — Telehealth: Payer: Self-pay

## 2021-05-03 NOTE — Telephone Encounter (Signed)
Able to reach pt regarding his recent Vascular US Dr. 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  Can not rule out higher grade stenosis versus occlusion  Patient scheduled to see [name] 05/13/2021".  [name] very thankful for the phone call of her results, all questions and concerns were address with nothing further at this time. Will see at next schedule f/u appt on 9/29 with [name]

## 2021-05-10 ENCOUNTER — Other Ambulatory Visit: Payer: Self-pay | Admitting: Urology

## 2021-05-10 DIAGNOSIS — M4316 Spondylolisthesis, lumbar region: Secondary | ICD-10-CM | POA: Diagnosis not present

## 2021-05-10 DIAGNOSIS — Z6830 Body mass index (BMI) 30.0-30.9, adult: Secondary | ICD-10-CM | POA: Diagnosis not present

## 2021-05-10 DIAGNOSIS — I10 Essential (primary) hypertension: Secondary | ICD-10-CM | POA: Diagnosis not present

## 2021-05-10 DIAGNOSIS — G609 Hereditary and idiopathic neuropathy, unspecified: Secondary | ICD-10-CM | POA: Diagnosis not present

## 2021-05-12 ENCOUNTER — Encounter: Payer: Self-pay | Admitting: Pulmonary Disease

## 2021-05-12 ENCOUNTER — Ambulatory Visit: Payer: PPO | Admitting: Pulmonary Disease

## 2021-05-12 ENCOUNTER — Other Ambulatory Visit: Payer: Self-pay

## 2021-05-12 VITALS — BP 118/60 | HR 54 | Temp 97.7°F | Ht 72.0 in | Wt 221.8 lb

## 2021-05-12 DIAGNOSIS — R0602 Shortness of breath: Secondary | ICD-10-CM

## 2021-05-12 DIAGNOSIS — Z8616 Personal history of COVID-19: Secondary | ICD-10-CM

## 2021-05-12 DIAGNOSIS — I272 Pulmonary hypertension, unspecified: Secondary | ICD-10-CM

## 2021-05-12 DIAGNOSIS — J449 Chronic obstructive pulmonary disease, unspecified: Secondary | ICD-10-CM

## 2021-05-12 DIAGNOSIS — J8410 Pulmonary fibrosis, unspecified: Secondary | ICD-10-CM

## 2021-05-12 MED ORDER — BREZTRI AEROSPHERE 160-9-4.8 MCG/ACT IN AERO
160.0000 ug | INHALATION_SPRAY | Freq: Two times a day (BID) | RESPIRATORY_TRACT | 0 refills | Status: DC
Start: 2021-05-12 — End: 2021-07-06

## 2021-05-12 NOTE — Progress Notes (Deleted)
   Subjective:    Patient ID: [name], male    DOB: April 13, 1941, 80 y.o.   MRN: [id number]  HPI    Review of Systems     Objective:   Physical Exam        Assessment & Plan:

## 2021-05-12 NOTE — Patient Instructions (Signed)
We are going to try the overnight oximetry again.  I am giving you a trial of medication called [name] that is 2 puffs twice a day please let us know how you do with that so we can call it into your pharmacy if it is working for you.   We will see you in follow-up in 3 to 4 weeks time with either me or the APP.

## 2021-05-12 NOTE — Progress Notes (Signed)
Subjective:    Patient ID: [name], male    DOB: [birth year], 80 y.o.   MRN: [id number] Chief Complaint  Patient presents with   Follow-up    COPD-    HPI [name] is an 80 year old former smoker (quit 1990, [place] 25) who presents for follow-up on the issue of dyspnea.  Last evaluated on 23 March 2021 for recurrent issues with dyspnea on exertion.  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  During his COVID issues he required 2 hospitalizations due to relapsing symptoms of hypoxia and shortness of breath.  The patient also had COVID cardiomyopathy which appears to have resolved to some degree.  Echocardiogram performed on 8 August had not been read at the time of his visit.  Noted to have a LVEF of 50 to 55%, mild pulmonary hypertension and increased size of the left atrium indicating increased volume.  He has diastolic dysfunction grade 1.  He is on Lasix which has helped some but he continues to have some issues with dyspnea.  PFTs obtained on 10 August show an FEV1 of 2.38 L or 75% predicted with minimal post bronchodilator response, FVC was 3.31 L or 74% predicted FEV1/FVC at 72%.  Lung volumes showed very mild reduction.  There is small airways reactivity/component and diffusion capacity mildly reduced by [place].  The patient was admitted on 23 April 2021 to [facility] for evaluation of dyspnea and chest tightness.  He had the diagnosis of "pleuritic chest pain" but the patient states that he never had chest pain per se just chest tightness.  He was classified as having unstable angina.  He was discharged with a Spiriva inhaler but states that he did not tolerate this.  He states that it elevated his blood pressure.  He does not endorse any symptomatology today.  Review of Systems A 10 point review of systems was performed and it is as noted above otherwise negative.  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[place]) 07/08/2019   Anxiety 06/04/2019   Chronic right shoulder pain 09/10/2018   Fall 03/08/2018   Cervical stenosis of spinal canal 06/05/2017   Pedal edema 06/05/2017   Health maintenance examination 12/06/2016   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/13/2016   Overweight (BMI 25.0-29.9) 07/04/2016   Low vitamin B12 level 01/01/2016   Exertional dyspnea 07/07/2015   Medicare annual wellness visit, subsequent 07/03/2015   Advanced care planning/counseling discussion 07/03/2015   Prediabetes 05/04/2015   Pleuritic chest pain 05/04/2015   Ex-smoker 05/04/2015   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   Spermatocele 04/02/2013   Syncopal vertigo 11/04/2010   Lumbar disc disease with radiculopathy 11/04/2010   CAD, ARTERY BYPASS GRAFT 08/11/2009   PVD (peripheral vascular disease) with claudication ([place]) 08/11/2009   DUPUYTREN'S CONTRACTURE, 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   Carotid stenosis 07/09/2008   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   Benign prostatic hyperplasia with urinary obstruction 09/06/2007   Essential hypertension 04/30/2007   GERD 04/30/2007   Hyperlipidemia 04/26/2007   OA (osteoarthritis) of knee 04/26/2007  DVT, HX OF 04/26/2007   Social History   Tobacco Use   Smoking status: Former    Packs/day: 1.00    Years: 25.00    Pack years: 25.00    Types: Cigarettes    Quit date: 08/15/1988    Years since quitting: 32.7   Smokeless tobacco: Never  Substance Use Topics   Alcohol use: No    Alcohol/week: 0.0 standard drinks   Allergies  Allergen  Reactions   Vioxx [Rofecoxib] Other (See Comments)    Hemorrhage    Spiriva Respimat [Tiotropium Bromide Monohydrate] Other (See Comments)    Elevated bp   Contrast Media [Iodinated Diagnostic Agents] Itching and Rash    Delayed reaction post abdominal aortagram.    Morphine Nausea Only and Other (See Comments)    Irritability    Current Meds  Medication Sig   acetaminophen (TYLENOL) 500 MG tablet Take 500 mg by mouth every 8 (eight) hours as needed.   aspirin EC 81 MG EC tablet Take 1 tablet (81 mg total) by mouth daily.   atorvastatin (LIPITOR) 20 MG tablet Take 1 tablet (20 mg total) by mouth daily.   Cyanocobalamin (B-12) 1000 MCG SUBL Place 1 tablet under the tongue daily.   cyclobenzaprine (FLEXERIL) 5 MG tablet Take 5 mg by mouth 3 (three) times daily as needed.   docusate sodium (COLACE) 100 MG capsule Take 100 mg by mouth 2 (two) times daily as needed for mild constipation.   ezetimibe (ZETIA) 10 MG tablet Take 1 tablet (10 mg total) by mouth daily.   fluticasone (FLONASE) 50 MCG/ACT nasal spray Place into both nostrils as needed for allergies or rhinitis.   furosemide (LASIX) 20 MG tablet TAKE ONE TABLET BY MOUTH EVERY DAY AS NEEDED   gabapentin (NEURONTIN) 300 MG capsule TAKE 1 CAPSULE BY MOUTH THREE TIMES DAILY   hydrocortisone 2.5 % lotion Apply topically daily as needed.   isosorbide mononitrate (IMDUR) 30 MG 24 hr tablet Take 1 tablet (30 mg total) by mouth at bedtime.   losartan (COZAAR) 25 MG tablet Take 1 tablet (25 mg total) by mouth daily.   metoprolol succinate (TOPROL-XL) 25 MG 24 hr tablet Take 0.5 tablets (12.5 mg total) by mouth daily.   nitroGLYCERIN (NITROSTAT) 0.4 MG SL tablet Place 1 tablet (0.4 mg total) under the tongue every 5 (five) minutes as needed for chest pain.   omeprazole (PRILOSEC) 20 MG capsule TAKE 1 CAPSULE EVERY DAY   polyethylene glycol (MIRALAX / GLYCOLAX) 17 g packet Take 17 g by mouth daily as needed for mild constipation.    PROAIR HFA  108 (90 Base) MCG/ACT inhaler TAKE 2 PUFFS INTO LUNGS EVERY 6 HOURS ASNEEDED FOR WHEEZING OR SHORTNESS OF BREATH   ranolazine (RANEXA) 500 MG 12 hr tablet Take 1 tablet (500 mg total) by mouth 2 (two) times daily.   tamsulosin (FLOMAX) 0.4 MG CAPS capsule Take 0.4 mg by mouth at bedtime.   tolterodine (DETROL LA) 4 MG 24 hr capsule TAKE 1 CAPSULE EVERY DAY   Immunization History  Administered Date(s) Administered   Fluad Quad(high Dose 65+) 04/17/2019   Influenza Whole 08/15/2000, 07/04/2007, 05/09/2008, 05/31/2010   Influenza, High Dose Seasonal PF 05/15/2013, 06/25/2015   Influenza,inj,Quad PF,6+ Mos 04/17/2014, 05/18/2016, 06/07/2018   Influenza-Unspecified 05/22/2017, 05/28/2020   PFIZER(Purple Top)SARS-COV-2 Vaccination 09/07/2019, 09/28/2019, 05/12/2020, 01/19/2021   Pneumococcal Conjugate-13 02/09/2015   Pneumococcal Polysaccharide-23 08/15/2000, 10/29/2008   Td 08/15/2005   Tdap 02/10/2018   Zoster Recombinat (Shingrix) 04/05/2017, 07/25/2017  Zoster, Live 02/04/2009      Objective:   Physical Exam BP 118/60 (BP Location: Left Arm, Patient Position: Sitting, Cuff Size: Normal)   Pulse (!) 54   Temp 97.7 F (36.5 C) (Oral)   Ht 6' (1.829 m)   Wt 221 lb 12.8 oz (100.6 kg)   SpO2 94%   BMI 30.08 kg/m  GENERAL: This is a well-developed, overweight gentleman who is awake, alert, no acute distress.   Ambulating with walker due to recent back surgery. HEAD: Normocephalic, atraumatic.  EYES: Pupils equal, round, reactive to light.  No scleral icterus.  MOUTH: Nose/mouth/throat not examined due to masking requirements for COVID 19. NECK: Supple. No thyromegaly. Trachea midline. No JVD.  No adenopathy. PULMONARY: Good air entry bilaterally, no adventitious sounds.  CARDIOVASCULAR: S1 and S2. Regular rate and rhythm.  Grade 1/6 systolic ejection murmur left sternal border. GASTROINTESTINAL: Protuberant abdomen, soft, no tenderness.   MUSCULOSKELETAL: No joint deformity, no  clubbing, no edema.  NEUROLOGIC: No overt focal deficits noted.  Speech is fluent.   SKIN: Intact,warm,dry.  Limited exam shows no rashes.  PSYCH: Mood and behavior normal.  Ambulatory oximetry: Showed transient desaturation to 88% which quickly recovered.  Otherwise no abnormalities.     Assessment & Plan:     ICD-10-CM   1. COPD mixed type ([place])  J44.9 Pulse oximetry, overnight   We will give a trial of Breztri 2 puffs twice daily His PFTs are consistent with a "mixed pattern" of obstruction and restriction Follow-up 3 to 4 weeks    2. Shortness of breath  R06.02    Ambulatory oximetry nonrevealing Will obtain overnight oximetry     3. Pulmonary hypertension (HCC) - mild  I27.20    Multifactorial Diastolic dysfunction Query chronic hypoxic vasoconstriction Overnight oximetry    4. Postinflammatory pulmonary fibrosis (HCC)  J84.10    This should not be progressive CT chest high rest if other testing inconclusive    5. Personal history of COVID-19  Z86.16    This issue adds complexity to his management     Orders Placed This Encounter  Procedures   Pulse oximetry, overnight    ON RA    Standing Status:   Future    Standing Expiration Date:   05/12/2022    Meds ordered this encounter  Medications   Budeson-Glycopyrrol-Formoterol (BREZTRI AEROSPHERE) 160-9-4.8 MCG/ACT AERO    Sig: Inhale 160 mcg into the lungs 2 (two) times daily.    Dispense:  5.9 g    Refill:  0    Order Specific Question:   Lot Number?    Answer:   [id number]    Order Specific Question:   Expiration Date?    Answer:   12/13/2023    Order Specific Question:   NDC    Answer:   [id number] [[id number]]    Order Specific Question:   Quantity    Answer:   2    C. Derrill Kay, MD Advanced Bronchoscopy PCCM [facility]    *This note was dictated using voice recognition software/Dragon.  Despite best efforts to proofread, errors can occur which can change the meaning.  Any  change was purely unintentional.

## 2021-05-13 ENCOUNTER — Encounter: Payer: Self-pay | Admitting: Cardiovascular Disease

## 2021-05-13 ENCOUNTER — Ambulatory Visit: Payer: PPO | Admitting: Cardiovascular Disease

## 2021-05-13 VITALS — BP 130/60 | HR 70 | Ht 72.0 in | Wt 223.5 lb

## 2021-05-13 DIAGNOSIS — E785 Hyperlipidemia, unspecified: Secondary | ICD-10-CM

## 2021-05-13 DIAGNOSIS — I739 Peripheral vascular disease, unspecified: Secondary | ICD-10-CM | POA: Diagnosis not present

## 2021-05-13 DIAGNOSIS — I10 Essential (primary) hypertension: Secondary | ICD-10-CM

## 2021-05-13 DIAGNOSIS — I2510 Atherosclerotic heart disease of native coronary artery without angina pectoris: Secondary | ICD-10-CM | POA: Diagnosis not present

## 2021-05-13 NOTE — Progress Notes (Signed)
Cardiology Office Note   Date:  05/13/2021   ID:  [name], DOB [birth year], MRN [id number]  PCP:  Ria Bush, MD  Cardiologist:  Dr. Rockey Situ   Chief Complaint  Patient presents with   Other    F/u LEA/ED pleuritic chest pain. Meds reviewed verbally with pt.       History of Present Illness: [name] is a 80 y.o. male who is here today for a follow-up visit regarding peripheral arterial disease. The patient has known history of coronary artery disease status post CABG in 1990, PCI of the left circumflex in 2002, DVT in the left lower extremity, hyperlipidemia and back surgery for spinal stenosis in 2017.  He had hip replacement x 2. 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  He was seen in early 2019 for peripheral arterial disease. Lower extremity arterial Doppler showed noncompressible arteries on the right side with normal toe brachial index and biphasic waveform.  Left ABI was normal with biphasic waveforms. Lower extremity arterial duplex showed evidence of bilateral SFA disease. His symptoms were felt to be multifactorial due to PAD, arthritis and severe peripheral neuropathy.  He had worsening bilateral leg pain in 2020.  Angiography in January of 2020 showed no significant aortoiliac disease.  On the right side, there was moderate to severely calcified SFA with diffuse disease especially in the midsegment and three-vessel runoff below the knee.  On the left, there was borderline significant SFA disease which was also calcified with three-vessel runoff below the knee.  I felt that his symptoms were out of proportion to his angiogram findings. The patient was hospitalized in October 2020 with COVID-19 pneumonia and had multiple complications.    He had back surgery done in November 2021 with significant improvement in his back pain and leg pain.    He was hospitalized briefly for  atypical chest pain with negative troponin.  Lexiscan Myoview showed no evidence of ischemia.  He reports shortness of breath and hypoxia mostly at night.  He saw Dr. Patsey Berthold and is going to get a nighttime oximetry study done.  He complained of increased left leg pain recently and underwent vascular testing which showed noncompressible vessels.  Duplex showed moderate left SFA stenosis although higher grade stenosis or occlusion could not be excluded.  He reports left leg pain both at rest and with walking.  It frequently improves with a heat pad.   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 06/2019 Echo (in setting of COVID): EF 35-40%, mild LVH, g1 DD, glob HK; b. 09/2019 Echo: eF 50-55%, no rwma, Gr1 DD, nl RV size/fxn.   Actinic keratosis    Anginal pain ([place])    BENIGN PROSTATIC HYPERTROPHY, WITH URINARY OBSTRUCTION 09/06/2007   CAD s/p CABG    a. 1990 s/p MI-->CABG x 2; b. 2002 s/p BMS to LCX; c. 06/2015 Cath: LM 50ost, LAD 100ost/p, 56m/d, D2 nl, LCX  patent stent, RCA 80p (small), LIMA->LAD nl, VG->D2 nl-->Med Rx; d. 05/2020 MV: fixed apical ant/apical defect. No signif ischemia.   Carotid arterial disease ([place])    a. 08/2016 Carotid U/S: <39% bilat.   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   Community acquired pneumonia of right lower lobe of lung 06/05/2017   COVID-19 virus infection 05/30/2019   Covid PNA with hospitalization 05/2019   DUPUYTREN'S CONTRACTURE, 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  DVT, HX OF 1998   Emphysema of lung ([place])    GERD 04/30/2007   Headache    hx migraines   History of hiatal hernia    History of shingles    HYPERLIPIDEMIA 04/26/2007   HYPERTENSION 04/30/2007   INGUINAL HERNIA, RIGHT 05/26/2010   Laceration of skin of left hand 03/08/2018   Lumbar disc disease with radiculopathy    neuropathy in feet   Myocardial infarction ([place])    1989, 2002   OSA (obstructive sleep apnea)    a. did not tolerate CPAP.    Osteoarthritis    PAD (peripheral artery disease) ([place])    a. 07/2017 LE duplex: RSFA 75-63m, LSFA 75-59m, 50-74d; c. 08/2018 Periph Angio: No signif AoIliac dzs. Mod-sev Ca2+ RSFA w/ diff dzs throughout- 3 vessel runoff. Borderline signif LSFA dzs w/ mod-sev Ca2+ vessels and 3 vessel runoff below the knee-->Med rx.   Pneumonia 07/2014   [facility] hospitalization   Pneumonia due to COVID-19 virus 06/02/2019   Pre-diabetes    PSA, INCREASED 07/09/2008   Spinal stenosis of lumbar region     Past Surgical History:  Procedure Laterality Date   ABDOMINAL AORTOGRAM W/LOWER EXTREMITY [name] 09/12/2018   Procedure: ABDOMINAL AORTOGRAM W/LOWER EXTREMITY;  Surgeon: [name], MD;  Location: [facility];  Service: Cardiovascular;  Laterality: [name];   APPENDECTOMY     rupture   BACK SURGERY     1984 and then [facility]   CARDIAC CATHETERIZATION [name] 07/07/2015   Procedure: Left Heart Cath and Cors/Grafts Angiography;  Surgeon: [name], MD;  Location: [facility];  Service: Cardiovascular;  Laterality: [name];   CATARACT EXTRACTION, BILATERAL     CERVICAL SPINE SURGERY  12/2016   cervical stenosis [name])   COLONOSCOPY  03/2010   HP polyp, diverticulosis, rpt 10 yrs ([name])   CORONARY ARTERY BYPASS GRAFT  1990   3 vessel    CYSTOSCOPY  12/23/10   [facility]   EYE SURGERY     cataract   JOINT REPLACEMENT     KNEE ARTHROSCOPY Right    x2   LAMINOTOMY  1986   L5/S1 lumbar laminotomy for two ruptured discs/fusion   LUMBAR LAMINECTOMY/DECOMPRESSION MICRODISCECTOMY [name] 07/13/2016   Procedure: LUMBAR TWO-THREE, LUMBAR THREE-FOUR, LUMBAR FOUR-FIVE LAMINECTOMY AND FORAMINOTOMY;  Surgeon: [name], MD;  Location: [facility];  Service: Neurosurgery;  Laterality: [name];  LAMINECTOMY AND FORAMINOTOMY L2-L3, L3-L4,L4-L5   LUMBAR SPINE SURGERY  06/2020   Bilateral redo laminectomy/laminotomy/foraminotomies/medial facetectomy to decompress the bilateral L4 and L5 nerve roots [name])    TOTAL HIP ARTHROPLASTY Bilateral 5726,[id number]   TOTAL KNEE ARTHROPLASTY Right 03/06/2017   Procedure: RIGHT TOTAL KNEE ARTHROPLASTY;  Surgeon: Gaynelle Arabian, MD;  Location: [facility];  Service: Orthopedics;  Laterality: Right;     Current Outpatient Medications  Medication Sig Dispense Refill   acetaminophen (TYLENOL) 500 MG tablet Take 500 mg by mouth every 8 (eight) hours as needed.     aspirin EC 81 MG EC tablet Take 1 tablet (81 mg total) by mouth daily. 30 tablet 0   atorvastatin (LIPITOR) 20 MG tablet Take 1 tablet (20 mg total) by mouth daily. 90 tablet 0   Budeson-Glycopyrrol-Formoterol (BREZTRI AEROSPHERE) 160-9-4.8 MCG/ACT AERO Inhale 160 mcg into the lungs 2 (two) times daily. 5.9 g 0   Cyanocobalamin (B-12) 1000 MCG SUBL Place 1 tablet under the tongue daily. 30 each    cyclobenzaprine (FLEXERIL) 5 MG tablet Take 5 mg by mouth 3 (  three) times daily as needed.     docusate sodium (COLACE) 100 MG capsule Take 100 mg by mouth 2 (two) times daily as needed for mild constipation.     ezetimibe (ZETIA) 10 MG tablet Take 1 tablet (10 mg total) by mouth daily. 90 tablet 3   fluticasone (FLONASE) 50 MCG/ACT nasal spray Place into both nostrils as needed for allergies or rhinitis.     furosemide (LASIX) 20 MG tablet TAKE ONE TABLET BY MOUTH EVERY DAY AS NEEDED 30 tablet 0   gabapentin (NEURONTIN) 600 MG tablet Take 600 mg by mouth 3 (three) times daily.     hydrocortisone 2.5 % lotion Apply topically daily as needed.     isosorbide mononitrate (IMDUR) 30 MG 24 hr tablet Take 1 tablet (30 mg total) by mouth at bedtime. 90 tablet 3   losartan (COZAAR) 25 MG tablet Take 1 tablet (25 mg total) by mouth daily. 90 tablet 3   metoprolol succinate (TOPROL-XL) 25 MG 24 hr tablet Take 0.5 tablets (12.5 mg total) by mouth daily. 45 tablet 3   nitroGLYCERIN (NITROSTAT) 0.4 MG SL tablet Place 1 tablet (0.4 mg total) under the tongue every 5 (five) minutes as needed for chest pain. 25 tablet 3   omeprazole  (PRILOSEC) 20 MG capsule TAKE 1 CAPSULE EVERY DAY 30 capsule 4   polyethylene glycol (MIRALAX / GLYCOLAX) 17 g packet Take 17 g by mouth daily as needed for mild constipation.      PROAIR HFA 108 (90 Base) MCG/ACT inhaler TAKE 2 PUFFS INTO LUNGS EVERY 6 HOURS ASNEEDED FOR WHEEZING OR SHORTNESS OF BREATH 8.5 g 6   ranolazine (RANEXA) 500 MG 12 hr tablet Take 1 tablet (500 mg total) by mouth 2 (two) times daily. 180 tablet 3   tamsulosin (FLOMAX) 0.4 MG CAPS capsule Take 0.4 mg by mouth at bedtime.     tolterodine (DETROL LA) 4 MG 24 hr capsule TAKE 1 CAPSULE EVERY DAY 90 capsule 1   No current facility-administered medications for this visit.    Allergies:   Vioxx [rofecoxib], Spiriva respimat [tiotropium bromide monohydrate], Contrast media [iodinated diagnostic agents], and Morphine    Social History:  The patient  reports that he quit smoking about 32 years ago. His smoking use included cigarettes. He has a 25.00 pack-year smoking history. He has never used smokeless tobacco. He reports that he does not drink alcohol and does not use drugs.   Family History:  The patient's family history includes Cancer in his sister and another family member; Diabetes in his brother and mother; Heart attack in his mother; Heart disease in his father; Kidney disease in his father and sister; Stroke in his father and mother.    ROS:  Please see the history of present illness.   Otherwise, review of systems are positive for none.   All other systems are reviewed and negative.    PHYSICAL EXAM: VS:  BP 130/60 (BP Location: Left Arm, Patient Position: Sitting, Cuff Size: Normal)   Pulse 70   Ht 6' (1.829 m)   Wt 223 lb 8 oz (101.4 kg)   SpO2 95%   BMI 30.31 kg/m  , BMI Body mass index is 30.31 kg/m. GEN: Well nourished, well developed, in no acute distress  HEENT: normal  Neck: no JVD, carotid bruits, or masses Cardiac: RRR; no  rubs, or gallops,no edema .  1/ 6 systolic ejection murmur in the aortic  area Respiratory:  clear to auscultation bilaterally, normal work of  breathing GI: soft, nontender, nondistended, + BS MS: no deformity or atrophy  Skin: warm and dry, no rash Neuro:  Strength and sensation are intact Psych: euthymic mood, full affect Vascular: Femoral pulses are normal.  Distal pulses are nonpalpable   EKG:  EKG is  ordered today. EKG showed normal sinus rhythm with left anterior fascicular block.  Recent Labs: 02/16/2021: ALT 13; TSH 2.77 04/20/2021: B Natriuretic Peptide 71.3 04/21/2021: BUN 30; Creatinine, Ser 1.15; Hemoglobin 12.8; Platelets 150; Potassium 3.7; Sodium 139    Lipid Panel    Component Value Date/Time   CHOL 114 02/16/2021 0911   TRIG 92.0 02/16/2021 0911   HDL 33.40 (L) 02/16/2021 0911   CHOLHDL 3 02/16/2021 0911   VLDL 18.4 02/16/2021 0911   LDLCALC 62 02/16/2021 0911   LDLDIRECT 106.0 01/01/2016 0844      Wt Readings from Last 3 Encounters:  05/13/21 223 lb 8 oz (101.4 kg)  05/12/21 221 lb 12.8 oz (100.6 kg)  04/21/21 216 lb 9.6 oz (98.2 kg)      No flowsheet data found.    ASSESSMENT AND PLAN:  1.  Peripheral arterial disease: The patient does have bilateral calcified SFA disease worse on the right side.  Current symptoms of left leg pain seem to be not consistent with claudication and more likely neuropathy or musculoskeletal pain.  I recommend continuing medical therapy.    2.  Coronary artery disease involving native coronary arteries without angina: Recent hospitalization for atypical chest pain.  No evidence of myocardial infarction and Lexiscan Myoview showed no evidence of ischemia.  3.  Hyperlipidemia: I reviewed most recent lipid profile done in July which showed an LDL of 62 and triglyceride of 92.  Based on this, recommend continuing atorvastatin and Zetia.  4.  Essential hypertension: Blood pressure is controlled.    Disposition:   FU with me in 12 months for PAD.  Continue to follow-up with Dr. Rockey Situ as  planned.  Signed, Kathlyn Sacramento, MD 05/13/21 [place], [place]

## 2021-05-13 NOTE — Patient Instructions (Signed)
Medication Instructions:  Your physician recommends that you continue on your current medications as directed. Please refer to the Current Medication list given to you today.  *If you need a refill on your cardiac medications before your next appointment, please call your pharmacy*   Lab Work: None ordered  If you have labs (blood work) drawn today and your tests are completely normal, you will receive your results only by: [place] (if you have MyChart) OR A paper copy in the mail If you have any lab test that is abnormal or we need to change your treatment, we will call you to review the results.   Testing/Procedures: None ordered   Follow-Up: At [facility], you and your health needs are our priority.  As part of our continuing mission to provide you with exceptional heart care, we have created designated Provider Care Teams.  These Care Teams include your primary Cardiologist (physician) and Advanced Practice Providers (APPs -  Physician Assistants and Nurse Practitioners) who all work together to provide you with the care you need, when you need it.  We recommend signing up for the patient portal called "MyChart".  Sign up information is provided on this After Visit Summary.  MyChart is used to connect with patients for Virtual Visits (Telemedicine).  Patients are able to view lab/test results, encounter notes, upcoming appointments, etc.  Non-urgent messages can be sent to your provider as well.   To learn more about what you can do with MyChart, go to [email or web address].    Your next appointment:   Your physician wants you to follow-up in: 1 year You will receive a reminder letter in the mail two months in advance. If you don't receive a letter, please call our office to schedule the follow-up appointment.   The format for your next appointment:   In Person  Provider:   You may see Kathlyn Sacramento, MD one of the following Advanced Practice Providers on your  designated Care Team:   Murray Hodgkins, NP Christell Faith, PA-C Marrianne Mood, PA-C Cadence Kathlen Mody, [place]   Other Instructions [name]

## 2021-05-14 ENCOUNTER — Other Ambulatory Visit: Payer: Self-pay

## 2021-05-14 ENCOUNTER — Telehealth: Payer: Self-pay

## 2021-05-14 ENCOUNTER — Ambulatory Visit
Admission: RE | Admit: 2021-05-14 | Discharge: 2021-05-14 | Disposition: A | Discharge status: Home / Self Care | Payer: PPO | Source: Ambulatory Visit | Attending: Physician Assistant | Admitting: Physician Assistant

## 2021-05-14 VITALS — BP 154/61 | HR 72 | Temp 98.2°F | Resp 16 | Ht 72.0 in | Wt 215.0 lb

## 2021-05-14 DIAGNOSIS — Z951 Presence of aortocoronary bypass graft: Secondary | ICD-10-CM | POA: Insufficient documentation

## 2021-05-14 DIAGNOSIS — I509 Heart failure, unspecified: Secondary | ICD-10-CM | POA: Insufficient documentation

## 2021-05-14 DIAGNOSIS — Z79899 Other long term (current) drug therapy: Secondary | ICD-10-CM | POA: Diagnosis not present

## 2021-05-14 DIAGNOSIS — N189 Chronic kidney disease, unspecified: Secondary | ICD-10-CM | POA: Insufficient documentation

## 2021-05-14 DIAGNOSIS — R058 Other specified cough: Secondary | ICD-10-CM | POA: Diagnosis not present

## 2021-05-14 DIAGNOSIS — H6692 Otitis media, unspecified, left ear: Secondary | ICD-10-CM | POA: Diagnosis not present

## 2021-05-14 DIAGNOSIS — Z955 Presence of coronary angioplasty implant and graft: Secondary | ICD-10-CM | POA: Insufficient documentation

## 2021-05-14 DIAGNOSIS — Z7951 Long term (current) use of inhaled steroids: Secondary | ICD-10-CM | POA: Diagnosis not present

## 2021-05-14 DIAGNOSIS — I130 Hypertensive heart and chronic kidney disease with heart failure and stage 1 through stage 4 chronic kidney disease, or unspecified chronic kidney disease: Secondary | ICD-10-CM | POA: Insufficient documentation

## 2021-05-14 DIAGNOSIS — H9202 Otalgia, left ear: Secondary | ICD-10-CM | POA: Diagnosis present

## 2021-05-14 DIAGNOSIS — J449 Chronic obstructive pulmonary disease, unspecified: Secondary | ICD-10-CM | POA: Insufficient documentation

## 2021-05-14 DIAGNOSIS — Z885 Allergy status to narcotic agent status: Secondary | ICD-10-CM | POA: Diagnosis not present

## 2021-05-14 DIAGNOSIS — Z20822 Contact with and (suspected) exposure to covid-19: Secondary | ICD-10-CM | POA: Insufficient documentation

## 2021-05-14 DIAGNOSIS — Z7982 Long term (current) use of aspirin: Secondary | ICD-10-CM | POA: Diagnosis not present

## 2021-05-14 DIAGNOSIS — Z87891 Personal history of nicotine dependence: Secondary | ICD-10-CM | POA: Diagnosis not present

## 2021-05-14 DIAGNOSIS — Z8616 Personal history of COVID-19: Secondary | ICD-10-CM | POA: Insufficient documentation

## 2021-05-14 LAB — SARS CORONAVIRUS 2 (TAT 6-24 HRS): SARS Coronavirus 2: NEGATIVE

## 2021-05-14 MED ORDER — AZITHROMYCIN 250 MG PO TABS: ORAL_TABLET | ORAL | 0 refills | Status: DC

## 2021-05-14 NOTE — Discharge Instructions (Addendum)
-  Azithromycin: 1 tablet on day 1 followed by 1 tablet daily starting on day 2 until gone. -Nasal saline rinse or Nettie pot like rinse to help clear the sinuses congestion -Continue your daily Flonase -Ibuprofen or Tylenol as needed for pain and fever -Can follow-up with primary care or return to clinic should symptoms worsen or not improve.

## 2021-05-14 NOTE — Telephone Encounter (Signed)
I apologize for just getting note in; had emergency in office for another pt that I am located at; pt called with starting 3 days ago pt has dry cough. Chest congestion, no wheezing yet, pt has COPD and no SOB worse than usual. No CP. Lt earache. No fever. I scheduled appt at [facility] today at 1:00pm. UC & ED precautions given and pt voiced understanding. Sending note to Dr Darnell Level as PCP as Juluis Rainier and Lattie Haw CMA.

## 2021-05-14 NOTE — Telephone Encounter (Signed)
Noted! Thank you

## 2021-05-14 NOTE — ED Provider Notes (Signed)
[facility]    CSN: [id number] Arrival date & time: 05/14/21  1035      History   Chief Complaint Chief Complaint  Patient presents with   Sore Throat   Otalgia    Left ear    HPI [name] is a 80 y.o. male.   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  Patient states his symptoms initially started 3 days ago but that his cough started today.  Today he took some Tylenol for the pain but is not taking thing else other than his normal medications.  He does not take a daily allergy medication.  Patient's wife is being seen for similar symptoms.  He states he took a home COVID test yesterday was negative.  He did have COVID back and October 2020 requiring a hospital stay.   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 06/2019 Echo (in setting of COVID): EF 35-40%, mild LVH, g1 DD, glob HK; b. 09/2019 Echo: eF 50-55%, no rwma, Gr1 DD, nl RV size/fxn.   Actinic keratosis    Anginal pain ([place])    BENIGN PROSTATIC HYPERTROPHY, WITH URINARY OBSTRUCTION 09/06/2007   CAD s/p CABG    a. 1990 s/p MI-->CABG x 2; b. 2002 s/p BMS to LCX; c. 06/2015 Cath: LM 50ost, LAD 100ost/p, 33m/d, D2 nl, LCX  patent stent, RCA 80p (small), LIMA->LAD nl, VG->D2 nl-->Med Rx; d. 05/2020 MV: fixed apical ant/apical defect. No signif ischemia.   Carotid arterial disease ([place])    a. 08/2016 Carotid U/S: <39% bilat.   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 did not tolerate CPAP.   Osteoarthritis    PAD (peripheral artery disease) ([place])    a. 07/2017 LE duplex: RSFA 75-51m, LSFA 75-28m, 50-74d; c. 08/2018 Periph Angio: No signif AoIliac dzs. Mod-sev Ca2+ RSFA w/ diff dzs throughout- 3 vessel runoff. Borderline signif LSFA dzs w/ mod-sev Ca2+ vessels and 3 vessel runoff below the knee-->Med rx.   Pneumonia 07/2014   [facility] hospitalization   Pneumonia due to COVID-19 virus 06/02/2019   Pre-diabetes    PSA, INCREASED 07/09/2008   Spinal stenosis of lumbar region     Patient Active Problem List   Diagnosis Date Noted   Coronary artery disease involving native coronary artery of native heart    Primary hypertension    Unstable angina ([place]) 04/20/2021   CKD (chronic kidney disease) stage 3, GFR 30-59 ml/min ([place]) 02/24/2021   Peripheral neuropathy 02/23/2021   Spondylolisthesis of lumbar region 07/07/2020   COPD (chronic  obstructive pulmonary disease) ([place]) 01/14/2020   Coccydynia 09/20/2019   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   Anxiety 06/04/2019   Chronic right shoulder pain 09/10/2018   Fall 03/08/2018   Cervical stenosis of spinal canal 06/05/2017   Pedal edema 06/05/2017   Health maintenance examination 12/06/2016   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/13/2016   Overweight (BMI 25.0-29.9) 07/04/2016   Low vitamin B12 level 01/01/2016   Exertional dyspnea 07/07/2015   Medicare annual wellness visit, subsequent 07/03/2015   Advanced care planning/counseling discussion 07/03/2015   Prediabetes 05/04/2015   Pleuritic chest pain 05/04/2015   Ex-smoker 05/04/2015   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   Spermatocele 04/02/2013   Syncopal vertigo 11/04/2010   Lumbar disc disease with radiculopathy 11/04/2010   CAD, ARTERY BYPASS GRAFT 08/11/2009   PVD (peripheral vascular disease) with claudication ([place]) 08/11/2009   DUPUYTREN'S CONTRACTURE, 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   Carotid stenosis 07/09/2008   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   Benign prostatic hyperplasia with urinary obstruction 09/06/2007   Essential hypertension 04/30/2007   GERD 04/30/2007   Hyperlipidemia 04/26/2007   OA (osteoarthritis) of knee 04/26/2007   DVT, HX OF 04/26/2007    Past Surgical History:  Procedure Laterality Date   ABDOMINAL AORTOGRAM W/LOWER EXTREMITY [name] 09/12/2018   Procedure: ABDOMINAL AORTOGRAM W/LOWER EXTREMITY;  Surgeon: [name], MD;  Location: [facility];  Service: Cardiovascular;  Laterality: [name];   APPENDECTOMY     rupture   BACK SURGERY     1984 and then [facility]   CARDIAC CATHETERIZATION [name] 07/07/2015   Procedure: Left Heart Cath and Cors/Grafts Angiography;  Surgeon: [name], MD;  Location: [facility];  Service: Cardiovascular;  Laterality: [name];   CATARACT EXTRACTION, BILATERAL     CERVICAL SPINE SURGERY  12/2016   cervical stenosis [name])   COLONOSCOPY  03/2010   HP polyp, diverticulosis, rpt 10 yrs ([name])   CORONARY ARTERY BYPASS GRAFT  1990   3 vessel    CYSTOSCOPY  12/23/10   [facility]   EYE SURGERY     cataract   JOINT REPLACEMENT     KNEE ARTHROSCOPY Right    x2   LAMINOTOMY  1986   L5/S1 lumbar laminotomy for two ruptured discs/fusion   LUMBAR LAMINECTOMY/DECOMPRESSION MICRODISCECTOMY [name] 07/13/2016   Procedure: LUMBAR TWO-THREE, LUMBAR THREE-FOUR, LUMBAR FOUR-FIVE LAMINECTOMY AND FORAMINOTOMY;  Surgeon: [name], MD;  Location: [facility];  Service: Neurosurgery;  Laterality: [name];  LAMINECTOMY AND FORAMINOTOMY L2-L3, L3-L4,L4-L5   LUMBAR SPINE SURGERY  06/2020   Bilateral redo  laminectomy/laminotomy/foraminotomies/medial facetectomy to decompress the bilateral L4 and L5 nerve roots [name])   TOTAL HIP ARTHROPLASTY Bilateral [id number]   TOTAL KNEE ARTHROPLASTY Right 03/06/2017   Procedure: RIGHT TOTAL KNEE ARTHROPLASTY;  Surgeon: Gaynelle Arabian, MD;  Location: [facility];  Service: Orthopedics;  Laterality: Right;       Home Medications    Prior to Admission medications   Medication Sig Start Date End Date Taking? Authorizing Provider  azithromycin (ZITHROMAX Z-PAK) 250 MG tablet Take 2 tablets on day 1 followed by 1 tablet on day 2 then daily until gone 05/14/21  Yes Luvenia Redden, PA-C  acetaminophen (TYLENOL) 500 MG tablet Take 500 mg by mouth every 8 (eight) hours as needed.    [provider]  aspirin EC 81 MG EC tablet Take 1 tablet (81 mg total) by mouth daily. 06/26/19   Elgergawy, Silver Huguenin, MD  atorvastatin (LIPITOR) 20 MG tablet Take  1 tablet (20 mg total) by mouth daily. 04/22/21   Lorella Nimrod, MD  Budeson-Glycopyrrol-Formoterol (BREZTRI AEROSPHERE) 160-9-4.8 MCG/ACT AERO Inhale 160 mcg into the lungs 2 (two) times daily. 05/12/21   Tyler Pita, MD  Cyanocobalamin (B-12) 1000 MCG SUBL Place 1 tablet under the tongue daily. 12/07/18   Ria Bush, MD  cyclobenzaprine (FLEXERIL) 5 MG tablet Take 5 mg by mouth 3 (three) times daily as needed. 04/09/21   [provider]  docusate sodium (COLACE) 100 MG capsule Take 100 mg by mouth 2 (two) times daily as needed for mild constipation.    [provider]  ezetimibe (ZETIA) 10 MG tablet Take 1 tablet (10 mg total) by mouth daily. 03/19/21   [name], MD  fluticasone (FLONASE) 50 MCG/ACT nasal spray Place into both nostrils as needed for allergies or rhinitis.    [provider]  furosemide (LASIX) 20 MG tablet TAKE ONE TABLET BY MOUTH EVERY DAY AS NEEDED 05/04/21   [name], MD  gabapentin (NEURONTIN) 600 MG tablet Take 600 mg by mouth 3 (three) times  daily.    [provider]  hydrocortisone 2.5 % lotion Apply topically daily as needed.    [provider]  isosorbide mononitrate (IMDUR) 30 MG 24 hr tablet Take 1 tablet (30 mg total) by mouth at bedtime. 03/19/21   [name], MD  losartan (COZAAR) 25 MG tablet Take 1 tablet (25 mg total) by mouth daily. 03/19/21   [name], MD  metoprolol succinate (TOPROL-XL) 25 MG 24 hr tablet Take 0.5 tablets (12.5 mg total) by mouth daily. 03/19/21   [name], MD  nitroGLYCERIN (NITROSTAT) 0.4 MG SL tablet Place 1 tablet (0.4 mg total) under the tongue every 5 (five) minutes as needed for chest pain. 05/01/21   Ria Bush, MD  omeprazole (PRILOSEC) 20 MG capsule TAKE 1 CAPSULE EVERY DAY 12/29/20   Tyler Pita, MD  polyethylene glycol (MIRALAX / GLYCOLAX) 17 g packet Take 17 g by mouth daily as needed for mild constipation.     [provider]  PROAIR HFA 108 [phone] Base) MCG/ACT inhaler TAKE 2 PUFFS INTO LUNGS EVERY 6 HOURS ASNEEDED FOR WHEEZING OR SHORTNESS OF BREATH 02/19/21   Ria Bush, MD  ranolazine (RANEXA) 500 MG 12 hr tablet Take 1 tablet (500 mg total) by mouth 2 (two) times daily. 03/19/21   [name], MD  tamsulosin (FLOMAX) 0.4 MG CAPS capsule Take 0.4 mg by mouth at bedtime. 05/21/20   [provider]  tolterodine (DETROL LA) 4 MG 24 hr capsule TAKE 1 CAPSULE EVERY DAY 05/10/21   Stoioff, Ronda Fairly, MD    Family History Family History  Problem Relation Age of Onset   Stroke Mother    Heart attack Mother    Diabetes Mother    Stroke Father    Heart disease Father    Kidney disease Father        PCKD   Cancer Sister        throat   Diabetes Brother    Kidney disease Sister        PCKD   Cancer Other        5/7 nephews with lung cancer    Social History Social History   Tobacco Use   Smoking status: Former    Packs/day: 1.00    Years: 25.00    Pack years: 25.00    Types: Cigarettes    Quit date:  08/15/1988    Years since quitting: 32.7   Smokeless tobacco: Never  Vaping Use   Vaping Use: Never used  Substance Use Topics   Alcohol use: No    Alcohol/week: 0.0 standard drinks   Drug use: No     Allergies   Vioxx [rofecoxib], Spiriva respimat [tiotropium bromide monohydrate], Contrast media [iodinated diagnostic agents], and Morphine   Review of Systems Review of Systems as noted above in HPI.  Other systems reviewed and found to be negative   Physical Exam Triage Vital Signs ED Triage Vitals  Enc Vitals Group     BP 05/14/21 1048 (!) 154/61     Pulse Rate 05/14/21 1048 72     Resp 05/14/21 1048 16     Temp 05/14/21 1048 98.2 F (36.8 C)     Temp Source 05/14/21 1048 Oral     SpO2 05/14/21 1048 98 %     Weight 05/14/21 1044 215 lb (97.5 kg)     Height 05/14/21 1044 6' (1.829 m)     Head Circumference --      Peak Flow --      Pain Score 05/14/21 1044 6     Pain Loc --      Pain Edu? --      Excl. in [place]? --    No data found.  Updated Vital Signs BP (!) 154/61 (BP Location: Left Arm)   Pulse 72   Temp 98.2 F (36.8 C) (Oral)   Resp 16   Ht 6' (1.829 m)   Wt 215 lb (97.5 kg)   SpO2 98%   BMI 29.16 kg/m    Physical Exam Constitutional:      Appearance: He is well-developed. He is not ill-appearing.  HENT:     Head: Normocephalic.     Right Ear: Ear canal normal. A middle ear effusion is present.     Left Ear: Ear canal normal. A middle ear effusion is present. Tympanic membrane is injected and erythematous.     Nose: Congestion present.     Right Sinus: No maxillary sinus tenderness or frontal sinus tenderness.     Left Sinus: No maxillary sinus tenderness.     Mouth/Throat:     Mouth: Mucous membranes are moist.     Pharynx: Posterior oropharyngeal erythema (mild) present.     Tonsils: No tonsillar exudate.  Cardiovascular:     Rate and Rhythm: Normal rate and regular rhythm.     Pulses: Normal pulses.     Heart sounds: Normal heart sounds.   Pulmonary:     Effort: Pulmonary effort is normal. No respiratory distress.     Breath sounds: No wheezing or rhonchi.     Comments: Breath sounds diminished throughout Musculoskeletal:        General: Normal range of motion.  Skin:    General: Skin is warm and dry.  Neurological:     General: No focal deficit present.     Mental Status: He is alert and oriented to person, place, and time.     UC Treatments / Results  Labs (all labs ordered are listed, but only abnormal results are displayed) Labs Reviewed  SARS CORONAVIRUS 2 (TAT 6-24 HRS)    EKG   Radiology No results found.  Procedures Procedures (including critical care time)  Medications Ordered in UC Medications - No data to display  Initial Impression / Assessment and Plan / UC Course  I have reviewed the triage vital signs and the nursing  notes.  Pertinent labs & imaging results that were available during my care of the patient were reviewed by me and considered in my medical decision making (see chart for details).    Patient presenting with 3 history of upper respiratory symptoms including hoarseness sore throat, left ear pain cough, and green mucus production from his nose.  Patient denies any fever.  Home COVID test last night was negative.  Another repeat has been ordered here.  Left ear with a erythematous and injected TM with fluid noted behind the eardrum.  Right ear with middle ear effusion.  Given his history of COPD and CHF, will go ahead and give him a prescription for azithromycin which will help with the ear infection as well.  Have him follow-up with his primary care or pulmonologist should symptoms worsen or not improve.  Final Clinical Impressions(s) / UC Diagnoses   Final diagnoses:  Left otitis media, unspecified otitis media type  Chronic obstructive pulmonary disease, unspecified COPD type ([place])  Chronic congestive heart failure, unspecified heart failure type [facility])     Discharge  Instructions      -Azithromycin: 1 tablet on day 1 followed by 1 tablet daily starting on day 2 until gone. -Nasal saline rinse or Nettie pot like rinse to help clear the sinuses congestion -Continue your daily Flonase -Ibuprofen or Tylenol as needed for pain and fever -Can follow-up with primary care or return to clinic should symptoms worsen or not improve.     ED Prescriptions     Medication Sig Dispense Auth. Provider   azithromycin (ZITHROMAX Z-PAK) 250 MG tablet Take 2 tablets on day 1 followed by 1 tablet on day 2 then daily until gone 6 tablet Luvenia Redden, PA-C      PDMP not reviewed this encounter.   Luvenia Redden, PA-C 05/14/21 1114

## 2021-05-14 NOTE — ED Triage Notes (Signed)
Patient reports cough that started this morning.  Patient reports history of COPD.  Patient reports left ear pain and sore throat that started 2 days ago.  Patient denies fevers.  Patient states that he did a home COVID test yesterday and was negative.

## 2021-05-18 DIAGNOSIS — G4730 Sleep apnea, unspecified: Secondary | ICD-10-CM | POA: Diagnosis not present

## 2021-05-18 DIAGNOSIS — R0683 Snoring: Secondary | ICD-10-CM | POA: Diagnosis not present

## 2021-05-23 ENCOUNTER — Encounter: Payer: Self-pay | Admitting: Emergency Medicine

## 2021-05-23 ENCOUNTER — Ambulatory Visit
Admission: EM | Admit: 2021-05-23 | Discharge: 2021-05-23 | Disposition: A | Discharge status: Home / Self Care | Payer: PPO | Attending: Emergency Medicine | Admitting: Emergency Medicine

## 2021-05-23 ENCOUNTER — Other Ambulatory Visit: Payer: Self-pay

## 2021-05-23 DIAGNOSIS — I10 Essential (primary) hypertension: Secondary | ICD-10-CM | POA: Diagnosis not present

## 2021-05-23 DIAGNOSIS — H6692 Otitis media, unspecified, left ear: Secondary | ICD-10-CM | POA: Diagnosis not present

## 2021-05-23 DIAGNOSIS — J0100 Acute maxillary sinusitis, unspecified: Secondary | ICD-10-CM

## 2021-05-23 MED ORDER — AMOXICILLIN-POT CLAVULANATE 875-125 MG PO TABS: 1.0000 | ORAL_TABLET | Freq: Two times a day (BID) | ORAL | 0 refills | Status: DC

## 2021-05-23 NOTE — ED Provider Notes (Signed)
[name]    CSN: [id number] Arrival date & time: 05/23/21  1202      History   Chief Complaint Chief Complaint  Patient presents with   Otalgia   Nasal Congestion    HPI [name] is a 80 y.o. male.  Patient presents with 2-week history of left ear pain, sinus congestion, sinus pressure, postnasal drip.  He denies fever, chills, unusual cough, unusual shortness of breath, or other symptoms.  Patient was seen at [facility] urgent care on 05/14/2021; diagnosed with left otitis media, COPD, CHF; treated with Zithromax.  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    The history is provided by the patient and medical records.   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 06/2019 Echo (in setting of COVID): EF 35-40%, mild LVH, g1 DD, glob HK; b. 09/2019 Echo: eF 50-55%, no rwma, Gr1 DD, nl RV size/fxn.   Actinic keratosis    Anginal pain ([place])    BENIGN PROSTATIC HYPERTROPHY, WITH URINARY OBSTRUCTION 09/06/2007   CAD s/p CABG    a. 1990 s/p MI-->CABG x 2; b. 2002 s/p BMS to LCX; c. 06/2015 Cath: LM 50ost, LAD 100ost/p, 28m/d, D2 nl, LCX  patent stent, RCA 80p (small), LIMA->LAD nl, VG->D2 nl-->Med Rx; d. 05/2020 MV: fixed apical ant/apical defect. No signif ischemia.   Carotid arterial disease ([place])    a. 08/2016 Carotid U/S: <39% bilat.   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 did not tolerate CPAP.   Osteoarthritis    PAD (peripheral artery disease) ([place])    a. 07/2017 LE duplex: RSFA 75-55m, LSFA 75-37m, 50-74d; c. 08/2018 Periph Angio: No signif AoIliac dzs. Mod-sev Ca2+ RSFA w/ diff dzs throughout- 3 vessel runoff. Borderline signif LSFA dzs w/ mod-sev Ca2+ vessels and 3 vessel runoff below the knee-->Med rx.   Pneumonia 07/2014   [facility] hospitalization   Pneumonia due to COVID-19 virus 06/02/2019   Pre-diabetes    PSA, INCREASED 07/09/2008   Spinal stenosis of lumbar region     Patient Active Problem List   Diagnosis Date Noted   Coronary artery disease involving native coronary artery of native heart    Primary hypertension    Unstable angina ([place]) 04/20/2021   CKD (chronic kidney disease) stage 3, GFR 30-59 ml/min ([place]) 02/24/2021   Peripheral neuropathy 02/23/2021   Spondylolisthesis of lumbar region 07/07/2020   COPD (chronic obstructive pulmonary disease) ([place]) 01/14/2020   Coccydynia 09/20/2019   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[place]) 07/08/2019   Anxiety 06/04/2019   Chronic right shoulder pain 09/10/2018   Fall 03/08/2018   Cervical stenosis of spinal canal 06/05/2017   Pedal edema  06/05/2017   Health maintenance examination 12/06/2016   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/13/2016   Overweight (BMI 25.0-29.9) 07/04/2016   Low vitamin B12 level 01/01/2016   Exertional dyspnea 07/07/2015   Medicare annual wellness visit, subsequent 07/03/2015   Advanced care planning/counseling discussion 07/03/2015   Prediabetes 05/04/2015   Pleuritic chest pain 05/04/2015   Ex-smoker 05/04/2015   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   Spermatocele 04/02/2013   Syncopal vertigo 11/04/2010   Lumbar disc disease with radiculopathy 11/04/2010   CAD, ARTERY BYPASS GRAFT 08/11/2009   PVD  (peripheral vascular disease) with claudication ([place]) 08/11/2009   DUPUYTREN'S CONTRACTURE, 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   Carotid stenosis 07/09/2008   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   Benign prostatic hyperplasia with urinary obstruction 09/06/2007   Essential hypertension 04/30/2007   GERD 04/30/2007   Hyperlipidemia 04/26/2007   OA (osteoarthritis) of knee 04/26/2007   DVT, HX OF 04/26/2007    Past Surgical History:  Procedure Laterality Date   ABDOMINAL AORTOGRAM W/LOWER EXTREMITY [name] 09/12/2018   Procedure: ABDOMINAL AORTOGRAM W/LOWER EXTREMITY;  Surgeon: [name], MD;  Location: [place] [facility];  Service: Cardiovascular;  Laterality: [name];   APPENDECTOMY     rupture   BACK SURGERY     1984 and then [facility]   CARDIAC CATHETERIZATION [name] 07/07/2015   Procedure: Left Heart Cath and Cors/Grafts Angiography;  Surgeon: [name], MD;  Location: [place] [facility];  Service: Cardiovascular;  Laterality: [name];   CATARACT EXTRACTION, BILATERAL     CERVICAL SPINE SURGERY  12/2016   cervical stenosis [name])   COLONOSCOPY  03/2010   HP polyp, diverticulosis, rpt 10 yrs ([name])   CORONARY ARTERY BYPASS GRAFT  1990   3 vessel    CYSTOSCOPY  12/23/10   [facility]   EYE SURGERY     cataract   JOINT REPLACEMENT     KNEE ARTHROSCOPY Right    x2   LAMINOTOMY  1986   L5/S1 lumbar laminotomy for two ruptured discs/fusion   LUMBAR LAMINECTOMY/DECOMPRESSION MICRODISCECTOMY [name] 07/13/2016   Procedure: LUMBAR TWO-THREE, LUMBAR THREE-FOUR, LUMBAR FOUR-FIVE LAMINECTOMY AND FORAMINOTOMY;  Surgeon: [name], MD;  Location: [place];  Service: Neurosurgery;  Laterality: [name];  LAMINECTOMY AND FORAMINOTOMY L2-L3, L3-L4,L4-L5   LUMBAR SPINE SURGERY  06/2020   Bilateral redo laminectomy/laminotomy/foraminotomies/medial facetectomy to decompress the bilateral L4 and L5 nerve roots [name])   TOTAL HIP ARTHROPLASTY Bilateral [id number]   TOTAL KNEE ARTHROPLASTY Right 03/06/2017    Procedure: RIGHT TOTAL KNEE ARTHROPLASTY;  Surgeon: Gaynelle Arabian, MD;  Location: [facility];  Service: Orthopedics;  Laterality: Right;       Home Medications    Prior to Admission medications   Medication Sig Start Date End Date Taking? Authorizing Provider  acetaminophen (TYLENOL) 500 MG tablet Take 500 mg by mouth every 8 (eight) hours as needed.    [provider]  amoxicillin-clavulanate (AUGMENTIN) 875-125 MG tablet Take 1 tablet by mouth every 12 (twelve) hours. 05/23/21   Sharion Balloon, NP  aspirin EC 81 MG EC tablet Take 1 tablet (81 mg total) by mouth daily. 06/26/19   Elgergawy, Silver Huguenin, MD  atorvastatin (LIPITOR) 20 MG tablet Take 1 tablet (20 mg total) by mouth daily. 04/22/21   Lorella Nimrod, MD  azithromycin (ZITHROMAX Z-PAK) 250 MG tablet Take 2 tablets on day 1 followed by 1 tablet on day 2 then daily until gone 05/14/21   Luvenia Redden, PA-C  Budeson-Glycopyrrol-Formoterol (BREZTRI AEROSPHERE) 160-9-4.8 MCG/ACT AERO Inhale 160  mcg into the lungs 2 (two) times daily. 05/12/21   Tyler Pita, MD  Cyanocobalamin (B-12) 1000 MCG SUBL Place 1 tablet under the tongue daily. 12/07/18   Ria Bush, MD  cyclobenzaprine (FLEXERIL) 5 MG tablet Take 5 mg by mouth 3 (three) times daily as needed. 04/09/21   [provider]  docusate sodium (COLACE) 100 MG capsule Take 100 mg by mouth 2 (two) times daily as needed for mild constipation.    [provider]  ezetimibe (ZETIA) 10 MG tablet Take 1 tablet (10 mg total) by mouth daily. 03/19/21   [name], MD  fluticasone (FLONASE) 50 MCG/ACT nasal spray Place into both nostrils as needed for allergies or rhinitis.    [provider]  furosemide (LASIX) 20 MG tablet TAKE ONE TABLET BY MOUTH EVERY DAY AS NEEDED 05/04/21   [name], MD  gabapentin (NEURONTIN) 600 MG tablet Take 600 mg by mouth 3 (three) times daily.    [provider]  hydrocortisone 2.5 % lotion Apply topically  daily as needed.    [provider]  isosorbide mononitrate (IMDUR) 30 MG 24 hr tablet Take 1 tablet (30 mg total) by mouth at bedtime. 03/19/21   [name], MD  losartan (COZAAR) 25 MG tablet Take 1 tablet (25 mg total) by mouth daily. 03/19/21   [name], MD  metoprolol succinate (TOPROL-XL) 25 MG 24 hr tablet Take 0.5 tablets (12.5 mg total) by mouth daily. 03/19/21   [name], MD  nitroGLYCERIN (NITROSTAT) 0.4 MG SL tablet Place 1 tablet (0.4 mg total) under the tongue every 5 (five) minutes as needed for chest pain. 05/01/21   Ria Bush, MD  omeprazole (PRILOSEC) 20 MG capsule TAKE 1 CAPSULE EVERY DAY 12/29/20   Tyler Pita, MD  polyethylene glycol (MIRALAX / GLYCOLAX) 17 g packet Take 17 g by mouth daily as needed for mild constipation.     [provider]  PROAIR HFA 108 [phone] Base) MCG/ACT inhaler TAKE 2 PUFFS INTO LUNGS EVERY 6 HOURS ASNEEDED FOR WHEEZING OR SHORTNESS OF BREATH 02/19/21   Ria Bush, MD  ranolazine (RANEXA) 500 MG 12 hr tablet Take 1 tablet (500 mg total) by mouth 2 (two) times daily. 03/19/21   [name], MD  tamsulosin (FLOMAX) 0.4 MG CAPS capsule Take 0.4 mg by mouth at bedtime. 05/21/20   [provider]  tolterodine (DETROL LA) 4 MG 24 hr capsule TAKE 1 CAPSULE EVERY DAY 05/10/21   Stoioff, Ronda Fairly, MD    Family History Family History  Problem Relation Age of Onset   Stroke Mother    Heart attack Mother    Diabetes Mother    Stroke Father    Heart disease Father    Kidney disease Father        PCKD   Cancer Sister        throat   Diabetes Brother    Kidney disease Sister        PCKD   Cancer Other        5/7 nephews with lung cancer    Social History Social History   Tobacco Use   Smoking status: Former    Packs/day: 1.00    Years: 25.00    Pack years: 25.00    Types: Cigarettes    Quit date: 08/15/1988    Years since quitting: 32.7   Smokeless tobacco: Never  Vaping Use    Vaping Use: Never used  Substance Use Topics   Alcohol use: No    Alcohol/week: 0.0 standard drinks   Drug use: No     Allergies   Vioxx [rofecoxib], Spiriva respimat [tiotropium bromide monohydrate], Contrast media [iodinated diagnostic agents], and Morphine   Review of Systems Review of Systems  Constitutional:  Negative for chills and fever.  HENT:  Positive for congestion, ear pain, postnasal drip and sinus pressure. Negative for sore throat.   Respiratory:  Negative for cough and shortness of breath.   Cardiovascular:  Negative for chest pain and palpitations.  Skin:  Negative for color change and rash.  All other systems reviewed and are negative.   Physical Exam Triage Vital Signs ED Triage Vitals  Enc Vitals Group     BP      Pulse      Resp      Temp      Temp src      SpO2      Weight      Height      Head Circumference      Peak Flow      Pain Score      Pain Loc      Pain Edu?      Excl. in [place]?    No data found.  Updated Vital Signs BP (!) 165/73 (BP Location: Left Arm)   Pulse 76   Temp 97.8 F (36.6 C) (Oral)   Resp 18   SpO2 95%   Visual Acuity Right Eye Distance:   Left Eye Distance:   Bilateral Distance:    Right Eye Near:   Left Eye Near:    Bilateral Near:     Physical Exam Vitals and nursing note reviewed.  Constitutional:      General: He is not in acute distress.    Appearance: He is well-developed.  HENT:     Head: Normocephalic and atraumatic.     Right Ear: Tympanic membrane and ear canal normal.     Left Ear: Ear canal normal. Tympanic membrane is erythematous.     Ears:     Comments: Left TM erythematous with central bright red erythema and dried blood.  No drainage.    Nose: Congestion present.     Mouth/Throat:     Mouth: Mucous membranes are moist.     Pharynx: Oropharynx is clear.  Eyes:     Conjunctiva/sclera: Conjunctivae normal.  Cardiovascular:     Rate and Rhythm: Normal rate and regular rhythm.      Heart sounds: Normal heart sounds.  Pulmonary:     Effort: Pulmonary effort is normal. No respiratory distress.     Breath sounds: Normal breath sounds.  Abdominal:     Palpations: Abdomen is soft.     Tenderness: There is no abdominal tenderness.  Musculoskeletal:     Cervical back: Neck supple.  Skin:    General: Skin is warm and dry.  Neurological:     General: No focal deficit present.     Mental Status: He is alert and oriented to person, place, and time.     Gait: Gait normal.  Psychiatric:        Mood and Affect: Mood normal.        Behavior: Behavior normal.     UC Treatments / Results  Labs (all labs ordered are listed, but only abnormal results are displayed) Labs Reviewed - No data to display  EKG   Radiology No results found.  Procedures Procedures (including  critical care time)  Medications Ordered in UC Medications - No data to display  Initial Impression / Assessment and Plan / UC Course  I have reviewed the triage vital signs and the nursing notes.  Pertinent labs & imaging results that were available during my care of the patient were reviewed by me and considered in my medical decision making (see chart for details).   Left otitis media, sinusitis, elevated blood pressure with HTN.  Treating with Augmentin.  Instructed patient to follow-up with ENT if his symptoms or not improving.  Also discussed that his blood pressure is elevated today and needs to be rechecked by his PCP in 2 to 4 weeks.  Education provided on managing hypertension.  Patient agrees to plan of care.   Final Clinical Impressions(s) / UC Diagnoses   Final diagnoses:  Left otitis media, unspecified otitis media type  Acute non-recurrent maxillary sinusitis  Elevated blood pressure reading in office with diagnosis of hypertension     Discharge Instructions      Take the Augmentin as directed.  Follow up with an ENT if your symptoms are not improving.    Your blood pressure  is elevated today at 165/73.  Please have this rechecked by your primary care provider in 2-4 weeks.           ED Prescriptions     Medication Sig Dispense Auth. Provider   amoxicillin-clavulanate (AUGMENTIN) 875-125 MG tablet  (Status: Discontinued) Take 1 tablet by mouth every 12 (twelve) hours. 14 tablet Sharion Balloon, NP   amoxicillin-clavulanate (AUGMENTIN) 875-125 MG tablet Take 1 tablet by mouth every 12 (twelve) hours. 14 tablet Sharion Balloon, NP      PDMP not reviewed this encounter.   Sharion Balloon, NP 05/23/21 1248

## 2021-05-23 NOTE — Discharge Instructions (Addendum)
Take the Augmentin as directed.  Follow up with an ENT if your symptoms are not improving.    Your blood pressure is elevated today at 165/73.  Please have this rechecked by your primary care provider in 2-4 weeks.

## 2021-05-23 NOTE — ED Triage Notes (Signed)
Here with left ear pain and nasal congestion x 2 weeks. Was treated with Z-pack and finished course, but still has sx.

## 2021-05-26 ENCOUNTER — Ambulatory Visit: Payer: PPO | Admitting: Family Medicine

## 2021-05-27 ENCOUNTER — Telehealth: Payer: Self-pay

## 2021-05-27 DIAGNOSIS — Z0189 Encounter for other specified special examinations: Secondary | ICD-10-CM

## 2021-05-27 NOTE — Telephone Encounter (Signed)
Called and spoke to patient about ONO results, per Dr Patsey Berthold states pt's O2 dropped as low as 63%  due to the pattern Pt needs a split night sleep study. Order placed, nothing further needed.

## 2021-05-28 ENCOUNTER — Other Ambulatory Visit: Payer: Self-pay | Admitting: Cardiovascular Disease

## 2021-06-08 ENCOUNTER — Other Ambulatory Visit (INDEPENDENT_AMBULATORY_CARE_PROVIDER_SITE_OTHER): Payer: PPO

## 2021-06-08 ENCOUNTER — Other Ambulatory Visit: Payer: Self-pay

## 2021-06-08 ENCOUNTER — Ambulatory Visit
Admission: EM | Admit: 2021-06-08 | Discharge: 2021-06-08 | Disposition: A | Discharge status: Home / Self Care | Payer: PPO | Attending: Urgent Care | Admitting: Urgent Care

## 2021-06-08 ENCOUNTER — Telehealth: Payer: Self-pay | Admitting: Family Medicine

## 2021-06-08 ENCOUNTER — Encounter: Payer: Self-pay | Admitting: Emergency Medicine

## 2021-06-08 ENCOUNTER — Ambulatory Visit (INDEPENDENT_AMBULATORY_CARE_PROVIDER_SITE_OTHER): Payer: PPO

## 2021-06-08 DIAGNOSIS — J449 Chronic obstructive pulmonary disease, unspecified: Secondary | ICD-10-CM

## 2021-06-08 DIAGNOSIS — M79661 Pain in right lower leg: Secondary | ICD-10-CM | POA: Diagnosis not present

## 2021-06-08 DIAGNOSIS — M7989 Other specified soft tissue disorders: Secondary | ICD-10-CM

## 2021-06-08 DIAGNOSIS — F17200 Nicotine dependence, unspecified, uncomplicated: Secondary | ICD-10-CM

## 2021-06-08 DIAGNOSIS — H6502 Acute serous otitis media, left ear: Secondary | ICD-10-CM | POA: Diagnosis not present

## 2021-06-08 DIAGNOSIS — S8011XA Contusion of right lower leg, initial encounter: Secondary | ICD-10-CM

## 2021-06-08 DIAGNOSIS — I739 Peripheral vascular disease, unspecified: Secondary | ICD-10-CM

## 2021-06-08 NOTE — Addendum Note (Signed)
Addended by: Ria Bush on: 06/08/2021 01:11 PM   Modules accepted: Orders

## 2021-06-08 NOTE — Telephone Encounter (Signed)
Patient notified as instructed by telephone and verbalized understanding. Patient scheduled for lab work today 06/08/21 at [facility] at 3:30 pm.

## 2021-06-08 NOTE — ED Provider Notes (Signed)
[name]   MRN: [id number] DOB: December 08, 1940  Subjective:   [name] is a 80 y.o. male presenting for 4-day history of acute onset persistent right lower leg pain with bruising and swelling.  Patient states that he had simply [place] dock [place] and made impact with his shin directly onto the boat.  Since then he has had persistent and progressively worsening right lower leg pain, bruising and swelling.  His leg has been intermittently warm and has had some intermittent medial calf pains.  He is able to bear weight and ambulate.  Has a remote history of a right lower leg DVT in the 90s but not since then.  He is not on any anticoagulation.  Has a history of peripheral vascular disease with claudication.  Patient is an active smoker.  Denies head injury, loss consciousness, confusion.  Takes aspirin daily.  No current facility-administered medications for this encounter.  Current Outpatient Medications:    acetaminophen (TYLENOL) 500 MG tablet, Take 500 mg by mouth every 8 (eight) hours as needed., Disp: , Rfl:    amoxicillin-clavulanate (AUGMENTIN) 875-125 MG tablet, Take 1 tablet by mouth every 12 (twelve) hours., Disp: 14 tablet, Rfl: 0   aspirin EC 81 MG EC tablet, Take 1 tablet (81 mg total) by mouth daily., Disp: 30 tablet, Rfl: 0   atorvastatin (LIPITOR) 20 MG tablet, Take 1 tablet (20 mg total) by mouth daily., Disp: 90 tablet, Rfl: 0   azithromycin (ZITHROMAX Z-PAK) 250 MG tablet, Take 2 tablets on day 1 followed by 1 tablet on day 2 then daily until gone, Disp: 6 tablet, Rfl: 0   Budeson-Glycopyrrol-Formoterol (BREZTRI AEROSPHERE) 160-9-4.8 MCG/ACT AERO, Inhale 160 mcg into the lungs 2 (two) times daily., Disp: 5.9 g, Rfl: 0   Cyanocobalamin (B-12) 1000 MCG SUBL, Place 1 tablet under the tongue daily., Disp: 30 each, Rfl:    cyclobenzaprine (FLEXERIL) 5 MG tablet, Take 5 mg by mouth 3 (three) times daily as needed., Disp: , Rfl:    docusate sodium  (COLACE) 100 MG capsule, Take 100 mg by mouth 2 (two) times daily as needed for mild constipation., Disp: , Rfl:    ezetimibe (ZETIA) 10 MG tablet, Take 1 tablet (10 mg total) by mouth daily., Disp: 90 tablet, Rfl: 3   fluticasone (FLONASE) 50 MCG/ACT nasal spray, Place into both nostrils as needed for allergies or rhinitis., Disp: , Rfl:    furosemide (LASIX) 20 MG tablet, TAKE ONE TABLET BY MOUTH EVERY DAY AS NEEDED, Disp: 30 tablet, Rfl: 1   gabapentin (NEURONTIN) 600 MG tablet, Take 600 mg by mouth 3 (three) times daily., Disp: , Rfl:    hydrocortisone 2.5 % lotion, Apply topically daily as needed., Disp: , Rfl:    isosorbide mononitrate (IMDUR) 30 MG 24 hr tablet, Take 1 tablet (30 mg total) by mouth at bedtime., Disp: 90 tablet, Rfl: 3   losartan (COZAAR) 25 MG tablet, Take 1 tablet (25 mg total) by mouth daily., Disp: 90 tablet, Rfl: 3   metoprolol succinate (TOPROL-XL) 25 MG 24 hr tablet, Take 0.5 tablets (12.5 mg total) by mouth daily., Disp: 45 tablet, Rfl: 3   nitroGLYCERIN (NITROSTAT) 0.4 MG SL tablet, Place 1 tablet (0.4 mg total) under the tongue every 5 (five) minutes as needed for chest pain., Disp: 25 tablet, Rfl: 3   omeprazole (PRILOSEC) 20 MG capsule, TAKE 1 CAPSULE EVERY DAY, Disp: 30 capsule, Rfl: 4   polyethylene glycol (MIRALAX / GLYCOLAX) 17 g  packet, Take 17 g by mouth daily as needed for mild constipation. , Disp: , Rfl:    PROAIR HFA 108 (90 Base) MCG/ACT inhaler, TAKE 2 PUFFS INTO LUNGS EVERY 6 HOURS ASNEEDED FOR WHEEZING OR SHORTNESS OF BREATH, Disp: 8.5 g, Rfl: 6   ranolazine (RANEXA) 500 MG 12 hr tablet, Take 1 tablet (500 mg total) by mouth 2 (two) times daily., Disp: 180 tablet, Rfl: 3   tamsulosin (FLOMAX) 0.4 MG CAPS capsule, Take 0.4 mg by mouth at bedtime., Disp: , Rfl:    tolterodine (DETROL LA) 4 MG 24 hr capsule, TAKE 1 CAPSULE EVERY DAY, Disp: 90 capsule, Rfl: 1   Allergies  Allergen Reactions   Vioxx [Rofecoxib] Other (See Comments)    Hemorrhage     Spiriva Respimat [Tiotropium Bromide Monohydrate] Other (See Comments)    Elevated bp   Contrast Media [Iodinated Diagnostic Agents] Itching and Rash    Delayed reaction post abdominal aortagram.    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 06/2019 Echo (in setting of COVID): EF 35-40%, mild LVH, g1 DD, glob HK; b. 09/2019 Echo: eF 50-55%, no rwma, Gr1 DD, nl RV size/fxn.   Actinic keratosis    Anginal pain ([place])    BENIGN PROSTATIC HYPERTROPHY, WITH URINARY OBSTRUCTION 09/06/2007   CAD s/p CABG    a. 1990 s/p MI-->CABG x 2; b. 2002 s/p BMS to LCX; c. 06/2015 Cath: LM 50ost, LAD 100ost/p, 68m/d, D2 nl, LCX  patent stent, RCA 80p (small), LIMA->LAD nl, VG->D2 nl-->Med Rx; d. 05/2020 MV: fixed apical ant/apical defect. No signif ischemia.   Carotid arterial disease ([place])    a. 08/2016 Carotid U/S: <39% bilat.   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 did not tolerate CPAP.   Osteoarthritis    PAD (peripheral artery disease) ([place])    a. 07/2017 LE duplex: RSFA 75-68m, LSFA 75-40m, 50-74d; c. 08/2018 Periph Angio: No signif AoIliac dzs. Mod-sev Ca2+ RSFA w/ diff dzs throughout- 3 vessel runoff. Borderline signif LSFA dzs w/ mod-sev Ca2+ vessels and 3 vessel runoff  below the knee-->Med rx.   Pneumonia 07/2014   [facility] hospitalization   Pneumonia due to COVID-19 virus 06/02/2019   Pre-diabetes    PSA, INCREASED 07/09/2008   Spinal stenosis of lumbar region      Past Surgical History:  Procedure Laterality Date   ABDOMINAL AORTOGRAM W/LOWER EXTREMITY [name] 09/12/2018   Procedure: ABDOMINAL AORTOGRAM W/LOWER EXTREMITY;  Surgeon: [name], MD;  Location: [facility];  Service: Cardiovascular;  Laterality: [name];   APPENDECTOMY     rupture   BACK SURGERY     1984  and then [facility]   CARDIAC CATHETERIZATION [name] 07/07/2015   Procedure: Left Heart Cath and Cors/Grafts Angiography;  Surgeon: [name], MD;  Location: [facility];  Service: Cardiovascular;  Laterality: [name];   CATARACT EXTRACTION, BILATERAL     CERVICAL SPINE SURGERY  12/2016   cervical stenosis [name])   COLONOSCOPY  03/2010   HP polyp, diverticulosis, rpt 10 yrs ([name])   CORONARY ARTERY BYPASS GRAFT  1990   3 vessel    CYSTOSCOPY  12/23/10   [facility]   EYE SURGERY     cataract   JOINT REPLACEMENT     KNEE ARTHROSCOPY Right    x2   LAMINOTOMY  1986   L5/S1 lumbar laminotomy for two ruptured discs/fusion   LUMBAR LAMINECTOMY/DECOMPRESSION MICRODISCECTOMY [name] 07/13/2016   Procedure: LUMBAR TWO-THREE, LUMBAR THREE-FOUR, LUMBAR FOUR-FIVE LAMINECTOMY AND FORAMINOTOMY;  Surgeon: [name], MD;  Location: [facility];  Service: Neurosurgery;  Laterality: [name];  LAMINECTOMY AND FORAMINOTOMY L2-L3, L3-L4,L4-L5   LUMBAR SPINE SURGERY  06/2020   Bilateral redo laminectomy/laminotomy/foraminotomies/medial facetectomy to decompress the bilateral L4 and L5 nerve roots [name])   TOTAL HIP ARTHROPLASTY Bilateral [id number]   TOTAL KNEE ARTHROPLASTY Right 03/06/2017   Procedure: RIGHT TOTAL KNEE ARTHROPLASTY;  Surgeon: Gaynelle Arabian, MD;  Location: [facility];  Service: Orthopedics;  Laterality: Right;    Family History  Problem Relation Age of Onset   Stroke  Mother    Heart attack Mother    Diabetes Mother    Stroke Father    Heart disease Father    Kidney disease Father        PCKD   Cancer Sister        throat   Diabetes Brother    Kidney disease Sister        PCKD   Cancer Other        5/7 nephews with lung cancer    Social History   Tobacco Use   Smoking status: Former    Packs/day: 1.00    Years: 25.00    Pack years: 25.00    Types: Cigarettes    Quit date: 08/15/1988    Years since quitting: 32.8   Smokeless tobacco: Never  Vaping Use   Vaping Use: Never used  Substance Use Topics   Alcohol use: No    Alcohol/week: 0.0 standard drinks   Drug use: No    ROS   Objective:   Vitals: BP (!) 145/71 (BP Location: Left Arm)   Pulse 80   Temp 98.1 F (36.7 C) (Oral)   SpO2 94%   Physical Exam Constitutional:      General: He is not in acute distress.    Appearance: Normal appearance. He is well-developed and normal weight. He is not ill-appearing, toxic-appearing or diaphoretic.  HENT:     Head: Normocephalic and atraumatic.     Right Ear: External ear normal.     Left Ear: External ear normal.     Nose: Nose normal.     Mouth/Throat:     Pharynx: Oropharynx is clear.  Eyes:     General: No scleral icterus.       Right eye: No discharge.        Left eye: No discharge.     Extraocular Movements: Extraocular movements intact.     Pupils: Pupils are equal, round, and reactive to light.  Cardiovascular:     Rate and Rhythm: Normal rate.  Pulmonary:     Effort: Pulmonary  effort is normal.  Musculoskeletal:     Cervical back: Normal range of motion.       Legs:     Comments: Patient does have medial calf tenderness but no warmth or erythema.  Skin:    General: Skin is warm and dry.     Findings: Bruising present.  Neurological:     Mental Status: He is alert and oriented to person, place, and time.  Psychiatric:        Mood and Affect: Mood normal.        Behavior: Behavior normal.        Thought  Content: Thought content normal.        Judgment: Judgment normal.    Personal read as negative for fracture, overread pending.  Assessment and Plan :   PDMP not reviewed this encounter.  1. Pain and swelling of right lower leg   2. Contusion of right leg, initial encounter   3. Peripheral vascular disease with claudication ([place])   4. Smoker    X-ray over-read was pending at time of discharge, recommended follow up with only abnormal results. Otherwise will not call for negative over-read. Patient was in agreement.  Low suspicion for a recurrent DVT.  Patient has a history of peripheral vascular disease with claudication.  This together with his recent right lower leg contusion is the most likely source of his pain.  Physical exam findings not necessarily consistent with DVT either.  Recommended he call his primary care doctor to see if they can order an outpatient ultrasound.  Otherwise, report to the emergency room if symptoms persist or worsen.  Use Tylenol for pain control.  I applied a 6 inch Ace wrap to the right lower leg.  Counseled on RICE method. Counseled patient on potential for adverse effects with medications prescribed/recommended today, ER and return-to-clinic precautions discussed, patient verbalized understanding.    Jaynee Eagles, PA-C 06/08/21 1125

## 2021-06-08 NOTE — ED Triage Notes (Signed)
Pt fell off his boat 5 days ago and has pain in his right shin. Pt states he did not lose consciousness or hit his head.

## 2021-06-08 NOTE — Telephone Encounter (Signed)
Pt called he was just seen at [facility] for right lower leg pain/swelling. They told him that he needed to call his PCP to have [place] place an order for an Ultrasound to check for a DVT  Please call pt at [phone]

## 2021-06-08 NOTE — Discharge Instructions (Signed)
Reach out to primary care provider to see if they would be able to order an ultrasound to rule out a DVT.  This is not my primary suspicion at this time given her history of peripheral vascular disease with claudication and the recent injury sustained which I am managing for a right lower leg contusion.  However, if you develop redness, warmth, worsening swelling and pain then please report to the emergency room and do not wait to be seen by your regular doctor.  Do not use any nonsteroidal anti-inflammatories (NSAIDs) like ibuprofen, Motrin, naproxen, Aleve, etc. which are all available over-the-counter.  Please just use Tylenol at a dose of 500mg -650mg  once every 6 hours as needed for your aches, pains, fevers.

## 2021-06-08 NOTE — Telephone Encounter (Signed)
Urgent care did not think he had DVT.  Recommend we start with D dimer. I've ordered lab test. If abnormal, will proceed with leg [place].

## 2021-06-09 ENCOUNTER — Other Ambulatory Visit: Payer: Self-pay | Admitting: Family Medicine

## 2021-06-09 ENCOUNTER — Other Ambulatory Visit: Payer: Self-pay | Admitting: Cardiovascular Disease

## 2021-06-09 DIAGNOSIS — M79661 Pain in right lower leg: Secondary | ICD-10-CM

## 2021-06-09 LAB — D-DIMER, QUANTITATIVE: D-Dimer, Quant: 0.83 mcg/mL FEU — ABNORMAL HIGH (ref <0.50)

## 2021-06-09 NOTE — Telephone Encounter (Signed)
Agree to oxygen at 2 L/min.  We have also ordered sleep test previously.

## 2021-06-09 NOTE — Telephone Encounter (Signed)
Dr. Gonzalez, please advise. Thanks 

## 2021-06-09 NOTE — Telephone Encounter (Signed)
Per Dr. Patsey Berthold verbally--can order 2L [facility]. Still needs to sleep study.

## 2021-06-10 DIAGNOSIS — J449 Chronic obstructive pulmonary disease, unspecified: Secondary | ICD-10-CM | POA: Diagnosis not present

## 2021-06-10 DIAGNOSIS — M1710 Unilateral primary osteoarthritis, unspecified knee: Secondary | ICD-10-CM | POA: Diagnosis not present

## 2021-06-11 ENCOUNTER — Ambulatory Visit (INDEPENDENT_AMBULATORY_CARE_PROVIDER_SITE_OTHER): Payer: PPO | Admitting: Primary Care

## 2021-06-11 ENCOUNTER — Encounter: Payer: Self-pay | Admitting: Primary Care

## 2021-06-11 ENCOUNTER — Other Ambulatory Visit: Payer: Self-pay

## 2021-06-11 ENCOUNTER — Ambulatory Visit
Admission: RE | Admit: 2021-06-11 | Discharge: 2021-06-11 | Disposition: A | Discharge status: Home / Self Care | Payer: PPO | Source: Ambulatory Visit | Attending: Family Medicine | Admitting: Family Medicine

## 2021-06-11 DIAGNOSIS — K219 Gastro-esophageal reflux disease without esophagitis: Secondary | ICD-10-CM | POA: Diagnosis not present

## 2021-06-11 DIAGNOSIS — M79604 Pain in right leg: Secondary | ICD-10-CM | POA: Diagnosis not present

## 2021-06-11 DIAGNOSIS — M79661 Pain in right lower leg: Secondary | ICD-10-CM | POA: Diagnosis not present

## 2021-06-11 DIAGNOSIS — G4734 Idiopathic sleep related nonobstructive alveolar hypoventilation: Secondary | ICD-10-CM | POA: Diagnosis not present

## 2021-06-11 DIAGNOSIS — M7989 Other specified soft tissue disorders: Secondary | ICD-10-CM | POA: Insufficient documentation

## 2021-06-11 DIAGNOSIS — J439 Emphysema, unspecified: Secondary | ICD-10-CM

## 2021-06-11 DIAGNOSIS — W19XXXS Unspecified fall, sequela: Secondary | ICD-10-CM

## 2021-06-11 DIAGNOSIS — R0609 Other forms of dyspnea: Secondary | ICD-10-CM | POA: Diagnosis not present

## 2021-06-11 NOTE — Assessment & Plan Note (Signed)
-   Overnight oximetry showed oxygen saturation dropped as low as 63%, started on 2L oxygen at night. Awaiting spit night sleep study

## 2021-06-11 NOTE — Assessment & Plan Note (Addendum)
-   Patient fell onto dock last week sustaining right LE injury. Leg is wrapped with ace bandage. He is awaiting LE ultrasound to be scheduled, we have reached out to his PCP to notify him that this has not yet been completed

## 2021-06-11 NOTE — Assessment & Plan Note (Signed)
-   Patient is having breakthrough reflux symptoms. Currently taking pantoprazole 40mg  daily, recently contacted his gastroenterologist about increasing this two twice a day

## 2021-06-11 NOTE — Progress Notes (Signed)
Agree with the details of the visit as noted by Elizabeth Walsh, NP.  C. Laura Lean Fayson, MD [facility] 

## 2021-06-11 NOTE — Progress Notes (Signed)
@Patient  ID: [name], male    DOB: [birth year], 80 y.o.   MRN: [id number]  No chief complaint on file.   Referring provider: Ria Bush, MD  HPI: 80 year old male, former smoker quit 1990 (25-pack-year history).  Past medical history significant for COPD mixed type, covid in 2020, HTN, cardiomyopathy, CAD  Patient of Dr. Patsey Berthold, last seen in office on 05/12/2021.  06/11/2021- Interim  Patient presents today for 1 month follow-up.  During last office visit patient was given trial of Breztri Aero severe 2 puffs twice daily and ordered for overnight oximetry test.  ONO showed oxygen saturation dropped as low as 63%, patient was ordered for 2L oxygen at night and split night sleep study. He is waiting to get sleep study scheduled. He received oxygen yesterday and sleep alright last night.   He used [employer], academic for 2 weeks and did not noticed any improvement in his breathing. He reports that his shortness of breath comes from the center of his chest described as a "burning sensation". He takes 40mg  pantoprazole once a day for reflux symptoms. He has sent his GI provider a message asking if this medication can be increased to twice a day.   He fell off boat onto a dock 1 week ago, PCP ordered an ultrasound of his lower extremity two days ago, he is waiting to be contacted to schedule.    Allergies  Allergen Reactions   Vioxx [Rofecoxib] Other (See Comments)    Hemorrhage    Spiriva Respimat [Tiotropium Bromide Monohydrate] Other (See Comments)    Elevated bp   Contrast Media [Iodinated Diagnostic Agents] Itching and Rash    Delayed reaction post abdominal aortagram.    Morphine Nausea Only and Other (See Comments)    Irritability     Immunization History  Administered Date(s) Administered   Fluad Quad(high Dose 65+) 04/17/2019   Influenza Whole 08/15/2000, 07/04/2007, 05/09/2008, 05/31/2010   Influenza, High Dose Seasonal PF 05/15/2013, 06/25/2015   Influenza,inj,Quad  PF,6+ Mos 04/17/2014, 05/18/2016, 06/07/2018   Influenza-Unspecified 05/22/2017, 05/28/2020   PFIZER(Purple Top)SARS-COV-2 Vaccination 09/07/2019, 09/28/2019, 05/12/2020, 01/19/2021   Pneumococcal Conjugate-13 02/09/2015   Pneumococcal Polysaccharide-23 08/15/2000, 10/29/2008   Td 08/15/2005   Tdap 02/10/2018   Zoster Recombinat (Shingrix) 04/05/2017, 07/25/2017   Zoster, Live 02/04/2009    Past Medical History:  Diagnosis Date   (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 06/2019 Echo (in setting of COVID): EF 35-40%, mild LVH, g1 DD, glob HK; b. 09/2019 Echo: eF 50-55%, no rwma, Gr1 DD, nl RV size/fxn.   Actinic keratosis    Anginal pain ([place])    BENIGN PROSTATIC HYPERTROPHY, WITH URINARY OBSTRUCTION 09/06/2007   CAD s/p CABG    a. 1990 s/p MI-->CABG x 2; b. 2002 s/p BMS to LCX; c. 06/2015 Cath: LM 50ost, LAD 100ost/p, 46m/d, D2 nl, LCX  patent stent, RCA 80p (small), LIMA->LAD nl, VG->D2 nl-->Med Rx; d. 05/2020 MV: fixed apical ant/apical defect. No signif ischemia.   Carotid arterial disease ([place])    a. 08/2016 Carotid U/S: <39% bilat.   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 did not tolerate CPAP.   Osteoarthritis    PAD (peripheral artery disease) ([place])    a. 07/2017 LE duplex: RSFA 75-73m, LSFA 75-70m, 50-74d; c. 08/2018 Periph Angio: No signif AoIliac  dzs. Mod-sev Ca2+ RSFA w/ diff dzs throughout- 3 vessel runoff. Borderline signif LSFA dzs w/ mod-sev Ca2+ vessels and 3 vessel runoff below the knee-->Med rx.   Pneumonia 07/2014   [facility] hospitalization   Pneumonia due to COVID-19 virus 06/02/2019   Pre-diabetes    PSA, INCREASED 07/09/2008   Spinal stenosis of lumbar region     Tobacco History: Social History   Tobacco Use  Smoking Status Former   Packs/day: 1.00   Years: 25.00   Pack years: 25.00   Types: Cigarettes   Quit date: 08/15/1988   Years since quitting: 32.8  Smokeless Tobacco Never   Counseling given: Not Answered   Outpatient Medications Prior to Visit  Medication Sig Dispense Refill   acetaminophen (TYLENOL) 500 MG tablet Take 500 mg by mouth every 8 (eight) hours as needed.     aspirin EC 81 MG EC tablet Take 1 tablet (81 mg total) by mouth daily. 30 tablet 0   atorvastatin (LIPITOR) 20 MG tablet Take 1 tablet (20 mg total) by mouth daily. 90 tablet 0   Budeson-Glycopyrrol-Formoterol (BREZTRI AEROSPHERE) 160-9-4.8 MCG/ACT AERO Inhale 160 mcg into the lungs 2 (two) times daily. 5.9 g 0   Cyanocobalamin (B-12) 1000 MCG SUBL Place 1 tablet under the tongue daily. 30 each    cyclobenzaprine (FLEXERIL) 5 MG tablet Take 5 mg by mouth 3 (three) times daily as needed.     docusate sodium (COLACE) 100 MG capsule Take 100 mg by mouth 2 (two) times daily as needed for mild constipation.     ezetimibe (ZETIA) 10 MG tablet Take 1 tablet (10 mg total) by mouth daily. 90 tablet 3   fluticasone (FLONASE) 50 MCG/ACT nasal spray Place into both nostrils as needed for allergies or rhinitis.     furosemide (LASIX) 20 MG tablet TAKE ONE TABLET BY MOUTH EVERY DAY AS NEEDED 30 tablet 1   gabapentin (NEURONTIN) 600 MG tablet Take 600 mg by mouth 3 (three) times daily.     hydrocortisone 2.5 % lotion Apply topically daily as needed.     isosorbide mononitrate (IMDUR) 30 MG 24 hr tablet TAKE 1 TABLET BY MOUTH AT BEDTIME 90 tablet 3    losartan (COZAAR) 25 MG tablet Take 1 tablet (25 mg total) by mouth daily. 90 tablet 3   metoprolol succinate (TOPROL-XL) 25 MG 24 hr tablet TAKE 1/2 TABLET BY MOUTH EVERY DAY. 45 tablet 3   nitroGLYCERIN (NITROSTAT) 0.4 MG SL tablet Place 1 tablet (0.4 mg total) under the tongue every 5 (five) minutes as needed for chest pain. 25 tablet 3   omeprazole (PRILOSEC) 20 MG capsule TAKE 1 CAPSULE EVERY DAY 30 capsule 4   polyethylene glycol (MIRALAX / GLYCOLAX) 17 g packet Take 17 g by mouth daily as needed for mild constipation.      predniSONE (DELTASONE) 10 MG tablet Take 10 mg by mouth daily with breakfast.     PROAIR HFA 108 (90 Base) MCG/ACT inhaler TAKE 2  PUFFS INTO LUNGS EVERY 6 HOURS ASNEEDED FOR WHEEZING OR SHORTNESS OF BREATH 8.5 g 6   ranolazine (RANEXA) 500 MG 12 hr tablet Take 1 tablet (500 mg total) by mouth 2 (two) times daily. 180 tablet 3   tamsulosin (FLOMAX) 0.4 MG CAPS capsule Take 0.4 mg by mouth at bedtime.     tolterodine (DETROL LA) 4 MG 24 hr capsule TAKE 1 CAPSULE EVERY DAY 90 capsule 1   amoxicillin-clavulanate (AUGMENTIN) 875-125 MG tablet Take 1 tablet by mouth every 12 (twelve) hours. 14 tablet 0   azithromycin (ZITHROMAX Z-PAK) 250 MG tablet Take 2 tablets on day 1 followed by 1 tablet on day 2 then daily until gone 6 tablet 0   No facility-administered medications prior to visit.   Review of Systems  Review of Systems  Constitutional: Negative.   Respiratory:  Positive for shortness of breath. Negative for cough and chest tightness.   Cardiovascular: Negative.     Physical Exam  BP (!) 150/70 (BP Location: Left Arm, Patient Position: Sitting, Cuff Size: Normal)   Pulse 83   Temp 97.7 F (36.5 C) (Oral)   Ht 6' (1.829 m)   Wt 222 lb 12.8 oz (101.1 kg)   SpO2 97%   BMI 30.22 kg/m  Physical Exam Constitutional:      General: He is not in acute distress.    Appearance: Normal appearance. He is not ill-appearing or toxic-appearing.  HENT:     Head:  Normocephalic and atraumatic.     Mouth/Throat:     Comments: Deferred d/t masking  Cardiovascular:     Rate and Rhythm: Normal rate and regular rhythm.  Pulmonary:     Effort: Pulmonary effort is normal.     Breath sounds: Normal breath sounds. No wheezing, rhonchi or rales.  Musculoskeletal:        General: Normal range of motion.  Skin:    Comments: Bruising and swelling right LE from fall (wrapped)  Neurological:     General: No focal deficit present.     Mental Status: He is alert and oriented to person, place, and time. Mental status is at baseline.  Psychiatric:        Mood and Affect: Mood normal.        Behavior: Behavior normal.        Thought Content: Thought content normal.        Judgment: Judgment normal.     Lab Results:  CBC    Component Value Date/Time   WBC 5.7 04/21/2021 0608   RBC 3.93 (L) 04/21/2021 0608   HGB 12.8 (L) 04/21/2021 0608   HGB 14.0 08/31/2018 1046   HCT 36.6 (L) 04/21/2021 0608   HCT 39.5 08/31/2018 1046   PLT 150 04/21/2021 0608   PLT 181 08/31/2018 1046   MCV 93.1 04/21/2021 0608   MCV 91 08/31/2018 1046   MCV 96 08/10/2014 0612   MCH 32.6 04/21/2021 0608   MCHC 35.0 04/21/2021 0608   RDW 11.9 04/21/2021 0608   RDW 12.5 08/31/2018 1046   RDW 12.8 08/10/2014 0612   LYMPHSABS 2.2 03/23/2021 1214   LYMPHSABS 1.1 08/31/2018 1046   MONOABS 0.6 03/23/2021 1214   EOSABS 0.3 03/23/2021 1214   EOSABS 0.3 08/31/2018 1046   BASOSABS 0.1 03/23/2021 1214   BASOSABS 0.1 08/31/2018 1046    BMET    Component Value Date/Time   NA 139 04/21/2021 0608   NA 139 09/10/2019 1123   NA 138 08/10/2014 0559   K  3.7 04/21/2021 0608   K 4.2 08/10/2014 0559   CL 106 04/21/2021 0608   CL 104 08/10/2014 0559   CO2 27 04/21/2021 0608   CO2 25 08/10/2014 0559   GLUCOSE 89 04/21/2021 0608   GLUCOSE 132 (H) 08/10/2014 0559   BUN 30 (H) 04/21/2021 0608   BUN 29 (H) 09/10/2019 1123   BUN 25 (H) 08/10/2014 0559   CREATININE 1.15 04/21/2021 0608    CREATININE 1.26 08/10/2014 0559   CALCIUM 8.5 (L) 04/21/2021 0608   CALCIUM 8.5 08/10/2014 0559   GFRNONAA >60 04/21/2021 0608   GFRNONAA 60 (L) 08/10/2014 0559   GFRNONAA >60 07/03/2013 1505   GFRAA 57 (L) 12/18/2019 1113   GFRAA >60 08/10/2014 0559   GFRAA >60 07/03/2013 1505    BNP    Component Value Date/Time   BNP 71.3 04/20/2021 2305    ProBNP No results found for: PROBNP  Imaging: DG Tibia/Fibula Right  Result Date: 06/08/2021 CLINICAL DATA:  Right lower leg pain 4 days after a fall. Ecchymosis EXAM: RIGHT TIBIA AND FIBULA - 2 VIEW COMPARISON:  None. FINDINGS: Soft tissue swelling seen at the ankle. No acute fracture or subluxation. Generalized osteopenia. Atheromatous calcification. Total knee arthroplasty. IMPRESSION: No acute osseous finding. Electronically Signed   By: Jorje Guild M.D.   On: 06/08/2021 11:38   US Venous Img Lower Unilateral Right  Result Date: 06/11/2021 CLINICAL DATA:  Right lower extremity pain and swelling EXAM: RIGHT LOWER EXTREMITY VENOUS DOPPLER ULTRASOUND TECHNIQUE: Gray-scale sonography with graded compression, as well as color Doppler and duplex ultrasound were performed to evaluate the lower extremity deep venous systems from the level of the common femoral vein and including the common femoral, femoral, profunda femoral, popliteal and calf veins including the posterior tibial, peroneal and gastrocnemius veins when visible. The superficial great saphenous vein was also interrogated. Spectral Doppler was utilized to evaluate flow at rest and with distal augmentation maneuvers in the common femoral, femoral and popliteal veins. COMPARISON:  None. FINDINGS: Contralateral Common Femoral Vein: Respiratory phasicity is normal and symmetric with the symptomatic side. No evidence of thrombus. Normal compressibility. Common Femoral Vein: No evidence of thrombus. Normal compressibility, respiratory phasicity and response to augmentation. Saphenofemoral  Junction: No evidence of thrombus. Normal compressibility and flow on color Doppler imaging. Profunda Femoral Vein: No evidence of thrombus. Normal compressibility and flow on color Doppler imaging. Femoral Vein: No evidence of thrombus. Normal compressibility, respiratory phasicity and response to augmentation. Popliteal Vein: No evidence of thrombus. Normal compressibility, respiratory phasicity and response to augmentation. Calf Veins: No evidence of thrombus. Normal compressibility and flow on color Doppler imaging. IMPRESSION: No evidence of deep venous thrombosis. Electronically Signed   By: Jerilynn Mages.  Shick M.D.   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 May need right heart cath in the future to evaluate pressures.   COPD (chronic obstructive pulmonary disease) (HCC) - No clinical benefit from [facility] trial. We do not suspect mild underlying COPD is causing his dyspnea symptoms. Continue Albuterol prn q 6 hours for sob/wheezing   GERD - Patient is having breakthrough reflux symptoms. Currently taking pantoprazole 40mg  daily, recently contacted his gastroenterologist about increasing this two twice a day   Fall - Patient fell onto dock last week sustaining right LE injury. Leg is wrapped with ace bandage. He is awaiting LE ultrasound  to be scheduled, we have reached out to his PCP to notify him that this has not yet been completed   Nocturnal hypoxia - Overnight oximetry showed oxygen saturation dropped as low as 63%, started on 2L oxygen at night. Awaiting spit night sleep study   40 mins spent on case; > 50% face to face  Martyn Ehrich, NP 06/11/2021

## 2021-06-11 NOTE — Assessment & Plan Note (Signed)
-   Sob likely coming from pulm HTN, we are getting split night sleep study to evaluated for underlying sleep apnea. May need right heart cath in the future to evaluate pressures.

## 2021-06-11 NOTE — Assessment & Plan Note (Addendum)
-   No clinical benefit from [facility] trial. We do not suspect mild underlying COPD is causing his dyspnea symptoms. Continue Albuterol prn q 6 hours for sob/wheezing

## 2021-06-11 NOTE — Patient Instructions (Signed)
Shortness of breath likely coming from pulmonary HTN, we are getting split night sleep study to evaluated for underlying sleep apnea which can cause low oxygen levels at night   We will call you after sleep study with results and discuss treatment options if needed including CPAP   Continue to wear 2L oxygen at night   Ok to stop [place] as it did not help, continue albuterol 2 puffs every 4-6 hours as needed for sob/wheezing   Follow-up with cardiology, you may need heart cath in the future to evaluate pressures  Follow-up: 3 months with Dr. Patsey [name] or sooner if needed

## 2021-06-22 ENCOUNTER — Telehealth: Payer: Self-pay

## 2021-06-22 ENCOUNTER — Other Ambulatory Visit
Admission: RE | Admit: 2021-06-22 | Payer: PPO | Source: Ambulatory Visit | Attending: Specialist | Admitting: Specialist

## 2021-06-22 ENCOUNTER — Other Ambulatory Visit: Payer: Self-pay

## 2021-06-22 ENCOUNTER — Ambulatory Visit (INDEPENDENT_AMBULATORY_CARE_PROVIDER_SITE_OTHER): Payer: PPO | Admitting: Nurse Practitioner

## 2021-06-22 ENCOUNTER — Encounter: Payer: Self-pay | Admitting: Nurse Practitioner

## 2021-06-22 VITALS — BP 104/56 | HR 80 | Temp 97.7°F | Resp 16 | Ht 72.0 in | Wt 220.5 lb

## 2021-06-22 DIAGNOSIS — M79604 Pain in right leg: Secondary | ICD-10-CM | POA: Diagnosis not present

## 2021-06-22 MED ORDER — DICLOFENAC SODIUM 1 % EX GEL: 2.0000 g | Freq: Four times a day (QID) | CUTANEOUS | 0 refills | Status: DC

## 2021-06-22 NOTE — Patient Instructions (Signed)
Low likely hood that it is a blood clot Try to elevate it and use the gel and see how it does  If you get Chest pain or short of breath you need to be seen at the Urgent care of Emergency department

## 2021-06-22 NOTE — Telephone Encounter (Signed)
Per chart review tab pt already has appt with Romilda Garret NP 06/22/21 at 11:40. Pt had 0.83 D Dimer on 06/08/21 and on 06/11/21 pt had US venous lower rt leg that impression was no evidence of DVT. Per access nurse note pt give care advice if develops CP or SOB. Sending note to Romilda Garret NP, Azalee Course CMA and Dr Alphia Moh for PCP.

## 2021-06-22 NOTE — Assessment & Plan Note (Signed)
Patient being seen for right lower leg pain.  Has been on for some times was evaluated urgent care x-ray negative and then told to follow-up with PCP who did ultrasound negative for DVT patient started getting better and then worse over the last day or so.  He has not tried anything besides ice we will try Voltaren gel and monitor if not getting better we can always consider reultrasounding for DVT last DVT was approximately 24 years ago per patient report continue to monitor. Start Voltaren gel 4 times daily as needed

## 2021-06-22 NOTE — Progress Notes (Signed)
Acute Office Visit  Subjective:    Patient ID: [name], male    DOB: 1940-09-09, 80 y.o.   MRN: [id number]  Chief Complaint  Patient presents with   Leg Pain    Right calf swollen/warm to the touch and red the past 2 weeks, painful to walk since his fall on 06/04/21. Has had blood work done and [place] that showed no DVT.  Pain was improving but on 06/21/21 started to hurt more.    HPI Patient is in today for Right leg pain   Original injury on 06/04/2021 Was seen at [facility] xray done and negative Then followed up and got [place] with out DVT noted States that yesterday he noticed worsening pain and and a small part of redness.  Has tried Ice.  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 06/2019 Echo (in setting of COVID): EF 35-40%, mild LVH, g1 DD, glob HK; b. 09/2019 Echo: eF 50-55%, no rwma, Gr1 DD, nl RV size/fxn.   Actinic keratosis    Anginal pain ([place])    BENIGN PROSTATIC HYPERTROPHY, WITH URINARY OBSTRUCTION 09/06/2007   CAD s/p CABG    a. 1990 s/p MI-->CABG x 2; b. 2002 s/p BMS to LCX; c. 06/2015 Cath: LM 50ost, LAD 100ost/p, 92m/d, D2 nl, LCX  patent stent, RCA 80p (small), LIMA->LAD nl, VG->D2 nl-->Med Rx; d. 05/2020 MV: fixed apical ant/apical defect. No signif ischemia.   Carotid arterial disease ([place])    a. 08/2016 Carotid U/S: <39% bilat.   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   Community acquired pneumonia of right lower lobe of lung 06/05/2017   COVID-19 virus infection 05/30/2019   Covid PNA with hospitalization 05/2019   DUPUYTREN'S CONTRACTURE, 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   DVT, HX OF 1998   Emphysema of lung ([place])    GERD 04/30/2007   Headache    hx migraines   History of hiatal hernia    History of shingles    HYPERLIPIDEMIA 04/26/2007   HYPERTENSION 04/30/2007   INGUINAL HERNIA, RIGHT 05/26/2010   Laceration of skin of left hand 03/08/2018   Lumbar disc  disease with radiculopathy    neuropathy in feet   Myocardial infarction ([place])    1989, 2002   OSA (obstructive sleep apnea)    a. did not tolerate CPAP.   Osteoarthritis    PAD (peripheral artery disease) ([place])    a. 07/2017 LE duplex: RSFA 75-57m, LSFA 75-77m, 50-74d; c. 08/2018 Periph Angio: No signif AoIliac dzs. Mod-sev Ca2+ RSFA w/ diff dzs throughout- 3 vessel runoff. Borderline signif LSFA dzs w/ mod-sev Ca2+ vessels and 3 vessel runoff below the knee-->Med rx.   Pneumonia 07/2014   [facility] hospitalization   Pneumonia due to COVID-19 virus 06/02/2019   Pre-diabetes    PSA, INCREASED 07/09/2008   Spinal stenosis of lumbar region     Past Surgical History:  Procedure Laterality Date   ABDOMINAL AORTOGRAM W/LOWER EXTREMITY [name] 09/12/2018   Procedure: ABDOMINAL AORTOGRAM W/LOWER EXTREMITY;  Surgeon: [name], MD;  Location: [facility];  Service: Cardiovascular;  Laterality: [name];   APPENDECTOMY     rupture   BACK SURGERY     1984 and then [facility]   CARDIAC CATHETERIZATION [name] 07/07/2015   Procedure: Left Heart Cath and Cors/Grafts Angiography;  Surgeon: [name], MD;  Location: [facility];  Service: Cardiovascular;  Laterality: [name];   CATARACT EXTRACTION, BILATERAL     CERVICAL SPINE SURGERY  12/2016   cervical stenosis [name])   COLONOSCOPY  03/2010   HP polyp, diverticulosis, rpt 10 yrs ([name])   CORONARY ARTERY BYPASS GRAFT  1990   3 vessel    CYSTOSCOPY  12/23/10   [facility]   EYE SURGERY     cataract   JOINT REPLACEMENT     KNEE ARTHROSCOPY Right    x2   LAMINOTOMY  1986   L5/S1 lumbar laminotomy for two ruptured discs/fusion   LUMBAR LAMINECTOMY/DECOMPRESSION MICRODISCECTOMY [name] 07/13/2016   Procedure: LUMBAR TWO-THREE, LUMBAR THREE-FOUR, LUMBAR FOUR-FIVE LAMINECTOMY AND FORAMINOTOMY;  Surgeon: [name], MD;  Location: [facility];  Service: Neurosurgery;  Laterality: [name];  LAMINECTOMY AND FORAMINOTOMY L2-L3, L3-L4,L4-L5    LUMBAR SPINE SURGERY  06/2020   Bilateral redo laminectomy/laminotomy/foraminotomies/medial facetectomy to decompress the bilateral L4 and L5 nerve roots [name])   TOTAL HIP ARTHROPLASTY Bilateral [id number]   TOTAL KNEE ARTHROPLASTY Right 03/06/2017   Procedure: RIGHT TOTAL KNEE ARTHROPLASTY;  Surgeon: Gaynelle Arabian, MD;  Location: [facility];  Service: Orthopedics;  Laterality: Right;    Family History  Problem Relation Age of Onset   Stroke Mother    Heart attack Mother    Diabetes Mother    Stroke Father    Heart disease Father    Kidney disease Father        PCKD   Cancer Sister        throat   Diabetes Brother    Kidney disease Sister        PCKD   Cancer Other        5/7 nephews with lung cancer    Social History   Socioeconomic History   Marital status: Married    Spouse name: Not on file   Number of children: Not on file   Years of education: Not on file   Highest education level: Not on file  Occupational History   Not on file  Tobacco Use   Smoking status: Former    Packs/day: 1.00    Years: 25.00    Pack years: 25.00    Types: Cigarettes    Quit date: 08/15/1988    Years since quitting: 32.8   Smokeless tobacco: Never  Vaping Use   Vaping Use: Never used  Substance and Sexual Activity   Alcohol use: No    Alcohol/week: 0.0 standard drinks   Drug use: No   Sexual activity: Yes  Other Topics Concern   Not on file  Social History Narrative   Married, wife [name], for 25 years in 2016. 1 daughter and 3 grandkids from first marriage.    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    Diet: good water, fruits/vegetables daily      OSA eval recommended while hospitalized with Covid 05/2019   Social Determinants of Health   Financial Resource Strain: Low Risk    Difficulty of Paying Living Expenses: Not hard at all  Food  Insecurity: No Food Insecurity   Worried About Charity fundraiser in the Last Year: Never true   Ran Out of Food in the Last Year: Never true  Transportation Needs: No Transportation Needs   Lack of Transportation (Medical): No   Lack of Transportation (Non-Medical): No  Physical Activity: Inactive  Days of Exercise per Week: 0 days   Minutes of Exercise per Session: 0 min  Stress: No Stress Concern Present   Feeling of Stress : Not at all  Social Connections: Not on file  Intimate Partner Violence: Not At Risk   Fear of Current or Ex-Partner: No   Emotionally Abused: No   Physically Abused: No   Sexually Abused: No    Outpatient Medications Prior to Visit  Medication Sig Dispense Refill   acetaminophen (TYLENOL) 500 MG tablet Take 500 mg by mouth every 8 (eight) hours as needed.     aspirin EC 81 MG EC tablet Take 1 tablet (81 mg total) by mouth daily. 30 tablet 0   atorvastatin (LIPITOR) 20 MG tablet Take 1 tablet (20 mg total) by mouth daily. 90 tablet 0   Budeson-Glycopyrrol-Formoterol (BREZTRI AEROSPHERE) 160-9-4.8 MCG/ACT AERO Inhale 160 mcg into the lungs 2 (two) times daily. 5.9 g 0   Cyanocobalamin (B-12) 1000 MCG SUBL Place 1 tablet under the tongue daily. 30 each    cyclobenzaprine (FLEXERIL) 5 MG tablet Take 5 mg by mouth 3 (three) times daily as needed.     docusate sodium (COLACE) 100 MG capsule Take 100 mg by mouth 2 (two) times daily as needed for mild constipation.     ezetimibe (ZETIA) 10 MG tablet Take 1 tablet (10 mg total) by mouth daily. 90 tablet 3   fluticasone (FLONASE) 50 MCG/ACT nasal spray Place into both nostrils as needed for allergies or rhinitis.     furosemide (LASIX) 20 MG tablet TAKE ONE TABLET BY MOUTH EVERY DAY AS NEEDED 30 tablet 1   gabapentin (NEURONTIN) 600 MG tablet Take 600 mg by mouth 3 (three) times daily.     hydrocortisone 2.5 % lotion Apply topically daily as needed.     isosorbide mononitrate (IMDUR) 30 MG 24 hr tablet TAKE 1 TABLET  BY MOUTH AT BEDTIME 90 tablet 3   losartan (COZAAR) 25 MG tablet Take 1 tablet (25 mg total) by mouth daily. 90 tablet 3   metoprolol succinate (TOPROL-XL) 25 MG 24 hr tablet TAKE 1/2 TABLET BY MOUTH EVERY DAY. 45 tablet 3   nitroGLYCERIN (NITROSTAT) 0.4 MG SL tablet Place 1 tablet (0.4 mg total) under the tongue every 5 (five) minutes as needed for chest pain. 25 tablet 3   omeprazole (PRILOSEC) 20 MG capsule TAKE 1 CAPSULE EVERY DAY 30 capsule 4   polyethylene glycol (MIRALAX / GLYCOLAX) 17 g packet Take 17 g by mouth daily as needed for mild constipation.      PROAIR HFA 108 (90 Base) MCG/ACT inhaler TAKE 2 PUFFS INTO LUNGS EVERY 6 HOURS ASNEEDED FOR WHEEZING OR SHORTNESS OF BREATH 8.5 g 6   ranolazine (RANEXA) 500 MG 12 hr tablet Take 1 tablet (500 mg total) by mouth 2 (two) times daily. 180 tablet 3   tamsulosin (FLOMAX) 0.4 MG CAPS capsule Take 0.4 mg by mouth at bedtime.     tolterodine (DETROL LA) 4 MG 24 hr capsule TAKE 1 CAPSULE EVERY DAY 90 capsule 1   predniSONE (DELTASONE) 10 MG tablet Take 10 mg by mouth daily with breakfast.     No facility-administered medications prior to visit.    Allergies  Allergen Reactions   Vioxx [Rofecoxib] Other (See Comments)    Hemorrhage    Spiriva Respimat [Tiotropium Bromide Monohydrate] Other (See Comments)    Elevated bp   Contrast Media [Iodinated Diagnostic Agents] Itching and Rash    Delayed reaction post abdominal  aortagram.    Morphine Nausea Only and Other (See Comments)    Irritability     Review of Systems  Constitutional:  Negative for chills and fever.  Respiratory:  Negative for cough and shortness of breath.   Cardiovascular:  Positive for leg swelling. Negative for chest pain.  Gastrointestinal:  Negative for diarrhea, rectal pain and vomiting.  Skin:  Positive for color change.      Objective:    Physical Exam Vitals and nursing note reviewed.  Constitutional:      Appearance: Normal appearance.     Comments:  Uses a rollater walker  Cardiovascular:     Rate and Rhythm: Normal rate and regular rhythm.     Pulses:          Posterior tibial pulses are 1+ on the right side and 1+ on the left side.  Pulmonary:     Effort: Pulmonary effort is normal.     Breath sounds: Normal breath sounds.  Abdominal:     General: Bowel sounds are normal.  Musculoskeletal:        General: Tenderness and signs of injury present.     Right lower leg: Edema present.     Left lower leg: Edema present.       Legs:     Comments: TTP. Some redness at noted in photo. No great tempature difference  Skin:    General: Skin is warm.     Capillary Refill: Capillary refill takes 2 to 3 seconds.  Neurological:     Mental Status: He is alert.  Psychiatric:        Mood and Affect: Mood normal.        Behavior: Behavior normal.        Thought Content: Thought content normal.        Judgment: Judgment normal.     BP (!) 104/56   Pulse 80   Temp 97.7 F (36.5 C)   Resp 16   Ht 6' (1.829 m)   Wt 220 lb 8 oz (100 kg)   SpO2 96%   BMI 29.91 kg/m  Wt Readings from Last 3 Encounters:  06/22/21 220 lb 8 oz (100 kg)  06/11/21 222 lb 12.8 oz (101.1 kg)  05/14/21 215 lb (97.5 kg)    There are no preventive care reminders to display for this patient.  There are no preventive care reminders to display for this patient.   Lab Results  Component Value Date   TSH 2.77 02/16/2021   Lab Results  Component Value Date   WBC 5.7 04/21/2021   HGB 12.8 (L) 04/21/2021   HCT 36.6 (L) 04/21/2021   MCV 93.1 04/21/2021   PLT 150 04/21/2021   Lab Results  Component Value Date   NA 139 04/21/2021   K 3.7 04/21/2021   CO2 27 04/21/2021   GLUCOSE 89 04/21/2021   BUN 30 (H) 04/21/2021   CREATININE 1.15 04/21/2021   BILITOT 0.5 02/16/2021   ALKPHOS 33 (L) 02/16/2021   AST 15 02/16/2021   ALT 13 02/16/2021   PROT 6.1 02/16/2021   ALBUMIN 4.2 02/16/2021   CALCIUM 8.5 (L) 04/21/2021   ANIONGAP 6 04/21/2021   GFR 50.25  (L) 02/16/2021   Lab Results  Component Value Date   CHOL 114 02/16/2021   Lab Results  Component Value Date   HDL 33.40 (L) 02/16/2021   Lab Results  Component Value Date   LDLCALC 62 02/16/2021   Lab Results  Component Value Date  TRIG 92.0 02/16/2021   Lab Results  Component Value Date   CHOLHDL 3 02/16/2021   Lab Results  Component Value Date   HGBA1C 6.3 02/16/2021       Assessment & Plan:   Problem List Items Addressed This Visit       Other   Right leg pain - Primary    Patient being seen for right lower leg pain.  Has been on for some times was evaluated urgent care x-ray negative and then told to follow-up with PCP who did ultrasound negative for DVT patient started getting better and then worse over the last day or so.  He has not tried anything besides ice we will try Voltaren gel and monitor if not getting better we can always consider reultrasounding for DVT last DVT was approximately 24 years ago per patient report continue to monitor. Start Voltaren gel 4 times daily as needed      Relevant Medications   diclofenac Sodium (VOLTAREN) 1 % GEL     No orders of the defined types were placed in this encounter.  This visit occurred during the SARS-CoV-2 public health emergency.  Safety protocols were in place, including screening questions prior to the visit, additional usage of staff PPE, and extensive cleaning of exam room while observing appropriate contact time as indicated for disinfecting solutions.   Romilda Garret, NP

## 2021-06-22 NOTE — Telephone Encounter (Signed)
[facility] - Client TELEPHONE ADVICE RECORD [facility] Patient Name: [name] Gender: Male DOB: 06-16-1941 Age: 80 Y 2 M 22 D Return Phone Number: [phone] (Primary), [phone] (Secondary) Address: City/ State/ Zip: [place] [address] Client [facility] - Client Client Site [facility] Physician Ria Bush - MD Contact Type Call Who Is Calling Patient / Member / Family / Caregiver Call Type Triage / Clinical Relationship To Patient Self Return Phone Number [phone] (Primary) Chief Complaint Leg Swelling And Edema Reason for Call Symptomatic / Request for [name] states he fell on the 21st, and he his calf is swollen, tight, and red and warm. It has been like this for 2 weeks. Translation No Nurse Assessment Nurse: Micki Riley, RN, Domenick Gong Date/Time (Eastern Time): 06/22/2021 9:51:01 AM Confirm and document reason for call. If symptomatic, describe symptoms. ---Caller states he had a fall on 10/21. States his right calf is swollen, tight, & warm (concerned for a blood clot); has been for the past 2 weeks. Denies fever or any other symptoms. States has an appt at 1140am. Does the patient have any new or worsening symptoms? ---Yes Will a triage be completed? ---Yes Related visit to physician within the last 2 weeks? ---[name] Does the PT have any chronic conditions? (i.e. diabetes, asthma, this includes High risk factors for pregnancy, etc.) ---Unknown Is this a behavioral health or substance abuse call? ---No Guidelines Guideline Title Affirmed Question Affirmed Notes Nurse Date/Time Eilene Ghazi Time) Leg Swelling and Edema [1] Thigh or calf pain AND [2] only 1 side AND [3] present > 1 hour Cazares, RN, [employer] Janie 06/22/2021 9:53:09 AM Disp. Time Eilene Ghazi Time) Disposition Final User 06/22/2021 9:56:29 AM See  HCP within 4 Hours (or PCP triage) Yes Cazares, RN, Domenick Gong PLEASE NOTE: All timestamps contained within this report are represented as [place] Standard Time. CONFIDENTIALTY NOTICE: This fax transmission is intended only for the addressee. It contains information that is legally privileged, confidential or otherwise protected from use or disclosure. If you are not the intended recipient, you are strictly prohibited from reviewing, disclosing, copying using or disseminating any of this information or taking any action in reliance on or regarding this information. If you have received this fax in error, please notify us immediately by telephone so that we can arrange for its return to [place]. Phone: [phone], Toll-Free: [phone], Fax: [phone] Page: 2 of 2 Call Id: [id number] [place] Disagree/Comply Comply Caller Understands Yes PreDisposition InappropriateToAsk Care Advice Given Per Guideline SEE HCP (OR PCP TRIAGE) WITHIN 4 HOURS: CALL EMS IF: * Chest pain or shortness of breath occurs. CARE ADVICE given per Leg Swelling and Edema (Adult) guideline. Referrals Warm transfer to backline REFERRED TO PCP OFFIC

## 2021-06-22 NOTE — Telephone Encounter (Addendum)
Seen today. Appreciate Matt seeing patient.

## 2021-06-23 ENCOUNTER — Other Ambulatory Visit
Admission: RE | Admit: 2021-06-23 | Discharge: 2021-06-23 | Disposition: A | Discharge status: Home / Self Care | Payer: PPO | Source: Ambulatory Visit | Attending: Pulmonary Disease | Admitting: Pulmonary Disease

## 2021-06-23 DIAGNOSIS — Z01812 Encounter for preprocedural laboratory examination: Secondary | ICD-10-CM | POA: Insufficient documentation

## 2021-06-23 DIAGNOSIS — Z20822 Contact with and (suspected) exposure to covid-19: Secondary | ICD-10-CM | POA: Diagnosis not present

## 2021-06-24 LAB — SARS CORONAVIRUS 2 (TAT 6-24 HRS): SARS Coronavirus 2: NEGATIVE

## 2021-06-29 ENCOUNTER — Other Ambulatory Visit
Admission: RE | Admit: 2021-06-29 | Discharge: 2021-06-29 | Disposition: A | Discharge status: Home / Self Care | Payer: PPO | Source: Ambulatory Visit | Attending: Pulmonary Disease | Admitting: Pulmonary Disease

## 2021-06-29 ENCOUNTER — Other Ambulatory Visit: Payer: Self-pay

## 2021-06-29 DIAGNOSIS — Z01812 Encounter for preprocedural laboratory examination: Secondary | ICD-10-CM | POA: Insufficient documentation

## 2021-06-29 DIAGNOSIS — Z20822 Contact with and (suspected) exposure to covid-19: Secondary | ICD-10-CM | POA: Insufficient documentation

## 2021-06-30 DIAGNOSIS — R0981 Nasal congestion: Secondary | ICD-10-CM | POA: Diagnosis not present

## 2021-06-30 DIAGNOSIS — H6502 Acute serous otitis media, left ear: Secondary | ICD-10-CM | POA: Diagnosis not present

## 2021-06-30 LAB — SARS CORONAVIRUS 2 (TAT 6-24 HRS): SARS Coronavirus 2: NEGATIVE

## 2021-07-01 ENCOUNTER — Encounter: Payer: Self-pay | Admitting: Nurse Practitioner

## 2021-07-01 ENCOUNTER — Other Ambulatory Visit: Payer: Self-pay

## 2021-07-01 ENCOUNTER — Ambulatory Visit (INDEPENDENT_AMBULATORY_CARE_PROVIDER_SITE_OTHER): Payer: PPO | Admitting: Nurse Practitioner

## 2021-07-01 VITALS — BP 150/60 | HR 72 | Ht 72.0 in | Wt 224.4 lb

## 2021-07-01 DIAGNOSIS — I255 Ischemic cardiomyopathy: Secondary | ICD-10-CM | POA: Diagnosis not present

## 2021-07-01 DIAGNOSIS — E785 Hyperlipidemia, unspecified: Secondary | ICD-10-CM

## 2021-07-01 DIAGNOSIS — I10 Essential (primary) hypertension: Secondary | ICD-10-CM

## 2021-07-01 DIAGNOSIS — I25118 Atherosclerotic heart disease of native coronary artery with other forms of angina pectoris: Secondary | ICD-10-CM

## 2021-07-01 DIAGNOSIS — I5032 Chronic diastolic (congestive) heart failure: Secondary | ICD-10-CM | POA: Diagnosis not present

## 2021-07-01 MED ORDER — ISOSORBIDE MONONITRATE ER 60 MG PO TB24: 60.0000 mg | ORAL_TABLET | Freq: Every day | ORAL | 3 refills | Status: DC

## 2021-07-01 NOTE — Progress Notes (Signed)
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 06/2019 Echo (in setting of COVID): EF 35-40%, mild LVH, g1 DD, glob HK; b. 09/2019 Echo: EF 50-55%, Gr1 DD; c. 03/2021 Echo: EF 50-55%, no rwma, mild LVH, GrI DD, nl RV fxn. RVSP 44.4mmHg. Mild-mod dil LA. Triv MR. Mild-mod Ao sclerosis.   Actinic keratosis    Anginal pain ([place])    BENIGN PROSTATIC HYPERTROPHY, WITH URINARY OBSTRUCTION 09/06/2007   CAD s/p CABG    a. 1990 s/p MI-->CABG x 2; b. 2002 s/p BMS to LCX; c. 06/2015 Cath: LM 50ost, LAD 100ost/p, 75m/d, D2 nl, LCX  patent stent, RCA 80p (small), LIMA->LAD nl, VG->D2 nl-->Med Rx; d. 05/2020 MV: fixed apical ant/apical defect. No signif ischemia; e. 04/2021 MV: EF 52%, no ischemia.   Carotid arterial disease ([place])    a. 08/2016 Carotid U/S: <39% bilat.   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 did not tolerate CPAP.   Osteoarthritis    PAD (peripheral artery disease) ([place])    a. 07/2017 LE duplex: RSFA 75-84m, LSFA 75-52m, 50-74d; c. 08/2018 Periph Angio: No signif AoIliac dzs. Mod-sev Ca2+ RSFA w/ diff dzs throughout- 3 vessel runoff. Borderline signif LSFA dzs w/ mod-sev Ca2+ vessels and 3 vessel runoff below the knee-->Med rx.   Pneumonia 07/2014   [facility] hospitalization   Pneumonia due to COVID-19 virus 06/02/2019   Pre-diabetes    PSA, INCREASED 07/09/2008   Spinal stenosis of lumbar region    Past Surgical History:  Procedure Laterality Date   ABDOMINAL AORTOGRAM W/LOWER EXTREMITY [name] 09/12/2018   Procedure: ABDOMINAL AORTOGRAM W/LOWER EXTREMITY;  Surgeon: [name], MD;  Location: [facility];  Service: Cardiovascular;  Laterality: [name];   APPENDECTOMY     rupture   BACK SURGERY     1984 and then [facility]   CARDIAC CATHETERIZATION [name] 07/07/2015   Procedure: Left Heart Cath and Cors/Grafts Angiography;  Surgeon: [name], MD;  Location: [facility];  Service: Cardiovascular;  Laterality: [name];   CATARACT EXTRACTION, BILATERAL     CERVICAL SPINE SURGERY  12/2016   cervical stenosis [name])   COLONOSCOPY  03/2010   HP polyp, diverticulosis, rpt 10 yrs (  [name])   CORONARY ARTERY BYPASS GRAFT  1990   3 vessel    CYSTOSCOPY  12/23/10   [facility]   EYE SURGERY     cataract   JOINT REPLACEMENT     KNEE ARTHROSCOPY Right    x2   LAMINOTOMY  1986   L5/S1 lumbar laminotomy for two ruptured discs/fusion   LUMBAR LAMINECTOMY/DECOMPRESSION MICRODISCECTOMY [name] 07/13/2016   Procedure: LUMBAR TWO-THREE, LUMBAR THREE-FOUR, LUMBAR FOUR-FIVE LAMINECTOMY AND FORAMINOTOMY;  Surgeon: [name], MD;  Location: [facility];  Service: Neurosurgery;  Laterality: [name];  LAMINECTOMY AND FORAMINOTOMY L2-L3, L3-L4,L4-L5   LUMBAR SPINE SURGERY  06/2020   Bilateral redo  laminectomy/laminotomy/foraminotomies/medial facetectomy to decompress the bilateral L4 and L5 nerve roots [name])   TOTAL HIP ARTHROPLASTY Bilateral [id number]   TOTAL KNEE ARTHROPLASTY Right 03/06/2017   Procedure: RIGHT TOTAL KNEE ARTHROPLASTY;  Surgeon: Gaynelle Arabian, MD;  Location: [facility];  Service: Orthopedics;  Laterality: Right;    Allergies  Allergies  Allergen Reactions   Vioxx [Rofecoxib] Other (See Comments)    Hemorrhage    Spiriva Respimat [Tiotropium Bromide Monohydrate] Other (See Comments)    Elevated bp   Contrast Media [Iodinated Diagnostic Agents] Itching and Rash    Delayed reaction post abdominal aortagram.    Morphine Nausea Only and Other (See Comments)    Irritability     History of Present Illness    80 year old male with above complex past medical history including CAD status post MI and CABG x2 in 1990.  This was followed by bare-metal stenting to the left circumflex in 2002.  Subsequent catheterization in November 2016, showed 2 of 2 patent grafts with patent circumflex stent.  He has known small vessel and diffuse right coronary artery disease, which has been medically managed with long-acting nitrate therapy.  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  In January 2020, he underwent lower extremity arterial angiography in the setting of ongoing lower extremity pain and abnormal duplex.  This showed diffuse and calcified bilateral SFA disease with three-vessel runoff below the knee bilaterally.  Medical therapy was recommended.  In October 2020, he was admitted with COVID-19.  Echo showed new LV dysfunction with an EF of 35 to 40%.  Follow-up echo in early 2021 showed improvement to 50-55%.  He had a low risk stress test in October 2021, which was performed prior to back surgery.  At his August 2022 visit, he complained of chest tightness and  dyspnea.  An echocardiogram was performed and showed an EF of 50 to 55% with normal RV function and an RVSP of 44.7 mmHg.  Low-dose Lasix was added as needed.  He subsequently underwent a Lexiscan Myoview in September 2022, in the setting of admission for unstable angina and dyspnea, which was low risk without evidence of ischemia.  Since then has been seen in urgent care on 3 occasions for sore throat and upper respiratory symptoms on the first 2, and most recently, on October 25, for persistent right lower leg pain, bruising, and swelling, most consistent with contusion per ED note.  Since his last cardiology visit, he notes that he is continued to have exertional chest tightness, occurring when he walks to his mailbox and sometimes when he is walking in his home.  Symptoms occur most days of the week, are associated with dyspnea, and resolve with rest.  He has been evaluated by pulmonology due to dyspnea and hypoxia, especially in  the evenings.  He is pending a sleep study and will also be placed on oxygen at night.  He denies palpitations, PND, orthopnea, dizziness, syncope, or early satiety.  He does have right greater than left lower extremity swelling for which he uses Lasix 20 mg every other day.  If he notes increasing weight, he will take Lasix daily for short periods of time.  Home Medications    Current Outpatient Medications  Medication Sig Dispense Refill   acetaminophen (TYLENOL) 500 MG tablet Take 500 mg by mouth every 8 (eight) hours as needed.     aspirin EC 81 MG EC tablet Take 1 tablet (81 mg total) by mouth daily. 30 tablet 0   atorvastatin (LIPITOR) 20 MG tablet Take 1 tablet (20 mg total) by mouth daily. 90 tablet 0   Cyanocobalamin (B-12) 1000 MCG SUBL Place 1 tablet under the tongue daily. 30 each    cyclobenzaprine (FLEXERIL) 5 MG tablet Take 5 mg by mouth 3 (three) times daily as needed.     diclofenac Sodium (VOLTAREN) 1 % GEL Apply 2 g topically 4 (four) times daily. 50 g 0    docusate sodium (COLACE) 100 MG capsule Take 100 mg by mouth 2 (two) times daily as needed for mild constipation.     ezetimibe (ZETIA) 10 MG tablet Take 1 tablet (10 mg total) by mouth daily. 90 tablet 3   fluticasone (FLONASE) 50 MCG/ACT nasal spray Place into both nostrils as needed for allergies or rhinitis.     furosemide (LASIX) 20 MG tablet TAKE ONE TABLET BY MOUTH EVERY DAY AS NEEDED 30 tablet 1   gabapentin (NEURONTIN) 600 MG tablet Take 600 mg by mouth 3 (three) times daily.     hydrocortisone 2.5 % lotion Apply topically daily as needed.     isosorbide mononitrate (IMDUR) 30 MG 24 hr tablet TAKE 1 TABLET BY MOUTH AT BEDTIME 90 tablet 3   losartan (COZAAR) 25 MG tablet Take 1 tablet (25 mg total) by mouth daily. 90 tablet 3   metoprolol succinate (TOPROL-XL) 25 MG 24 hr tablet TAKE 1/2 TABLET BY MOUTH EVERY DAY. 45 tablet 3   nitroGLYCERIN (NITROSTAT) 0.4 MG SL tablet Place 1 tablet (0.4 mg total) under the tongue every 5 (five) minutes as needed for chest pain. 25 tablet 3   omeprazole (PRILOSEC) 20 MG capsule TAKE 1 CAPSULE EVERY DAY 30 capsule 4   OXYGEN Inhale 2 L into the lungs at bedtime.     polyethylene glycol (MIRALAX / GLYCOLAX) 17 g packet Take 17 g by mouth daily as needed for mild constipation.      PROAIR HFA 108 (90 Base) MCG/ACT inhaler TAKE 2 PUFFS INTO LUNGS EVERY 6 HOURS ASNEEDED FOR WHEEZING OR SHORTNESS OF BREATH 8.5 g 6   ranolazine (RANEXA) 500 MG 12 hr tablet Take 1 tablet (500 mg total) by mouth 2 (two) times daily. 180 tablet 3   tamsulosin (FLOMAX) 0.4 MG CAPS capsule Take 0.4 mg by mouth at bedtime.     tolterodine (DETROL LA) 4 MG 24 hr capsule TAKE 1 CAPSULE EVERY DAY 90 capsule 1   Budeson-Glycopyrrol-Formoterol (BREZTRI AEROSPHERE) 160-9-4.8 MCG/ACT AERO Inhale 160 mcg into the lungs 2 (two) times daily. (Patient not taking: Reported on 07/01/2021) 5.9 g 0   No current facility-administered medications for this visit.     Review of Systems    Chronic  dyspnea on exertion and exertional chest tightness as outlined above.  He has recurrent left lower extremity  swelling which is mostly well managed with Lasix.  He denies palpitations, PND, orthopnea, dizziness, syncope, or early satiety.  All other systems reviewed and are otherwise negative except as noted above.    Physical Exam    VS:  BP (!) 150/60 (BP Location: Left Arm, Patient Position: Sitting, Cuff Size: Normal)   Pulse 72   Ht 6' (1.829 m)   Wt 224 lb 6 oz (101.8 kg)   SpO2 97%   BMI 30.43 kg/m  , BMI Body mass index is 30.43 kg/m.     GEN: Well nourished, well developed, in no acute distress. HEENT: normal. Neck: Supple, no JVD, carotid bruits, or masses. Cardiac: RRR, 2/6 systolic murmur heard throughout, rubs or gallops. No clubbing, cyanosis, 1+ right lower extremity and trace left lower extremity edema.  PT + and equal bilaterally.  Respiratory:  Respirations regular and unlabored, clear to auscultation bilaterally. GI: Soft, nontender, nondistended, BS + x 4. MS: no deformity or atrophy. Skin: warm and dry, no rash. Neuro:  Strength and sensation are intact. Psych: Normal affect.  Accessory Clinical Findings    ECG personally reviewed by me today -regular sinus rhythm, 72, left axis deviation, left anterior fascicular block- no acute changes.  Lab Results  Component Value Date   WBC 5.7 04/21/2021   HGB 12.8 (L) 04/21/2021   HCT 36.6 (L) 04/21/2021   MCV 93.1 04/21/2021   PLT 150 04/21/2021   Lab Results  Component Value Date   CREATININE 1.15 04/21/2021   BUN 30 (H) 04/21/2021   NA 139 04/21/2021   K 3.7 04/21/2021   CL 106 04/21/2021   CO2 27 04/21/2021   Lab Results  Component Value Date   ALT 13 02/16/2021   AST 15 02/16/2021   ALKPHOS 33 (L) 02/16/2021   BILITOT 0.5 02/16/2021   Lab Results  Component Value Date   CHOL 114 02/16/2021   HDL 33.40 (L) 02/16/2021   LDLCALC 62 02/16/2021   LDLDIRECT 106.0 01/01/2016   TRIG 92.0 02/16/2021    CHOLHDL 3 02/16/2021    Lab Results  Component Value Date   HGBA1C 6.3 02/16/2021    Assessment & Plan    1.  Stable angina/coronary artery disease: Status post remote CABG x2 with 2 of 2 patent grafts on catheterization in 2016 and small vessel right coronary disease (ungrafted).  He has been medically managed since then.  Over the past 4 to 5 months, he has been experiencing exertional chest tightness and dyspnea.  Echocardiogram in August showed an EF of 50 to 55% with elevated RVSP.  He was admitted for chest tightness in September, ruled out, and had a nonischemic stress test.  Unfortunately, he continues to have exertional chest tightness.  We reviewed options for management today.  I am going to increase his isosorbide to 60 mg daily and we will move this from a chest 2 AM.  Could consider future titration in the future versus titration of Ranexa, versus diagnostic catheterization for persistent symptoms, to clear the air.  He otherwise remains on aspirin, statin, beta-blocker, and Zetia.  2.  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  He has chronic right greater than left lower extremity swelling, which is managed by taking Lasix every other day.  Sometimes he has to take this daily.  He has mild lower extremity swelling today.  He does weigh daily.  He remains on beta-blocker and ARB therapy.  Blood  pressure elevated today-increasing nitrate.  Consider addition of empagliflozin in the future.  3.  Essential hypertension: Blood pressure elevated today.  Patient notes is typically normal.  Increasing isosorbide in the setting of chest tightness.  4.  Hyperlipidemia: Remains on statin and Zetia therapy.  LDL was 62 in July with normal AST and ALT at that time.  5.  Chronic pain: Some improvement with recent titration of gabapentin.  6.  Nocturnal hypoxia: Pending sleep study-managed by pulmonology and pending at bedtime  oxygen therapy.  7.  Disposition: Follow-up in 1 month or sooner if necessary.   Murray Hodgkins, NP 07/01/2021, 8:29 AM

## 2021-07-01 NOTE — Patient Instructions (Addendum)
Medication Instructions:  Your physician has recommended you make the following change in your medication:   INCREASE Isosorbide mononitrate (Imdur) to 60 mg once a day  *If you need a refill on your cardiac medications before your next appointment, please call your pharmacy*   Lab Work: None  If you have labs (blood work) drawn today and your tests are completely normal, you will receive your results only by: [facility] (if you have MyChart) OR A paper copy in the mail If you have any lab test that is abnormal or we need to change your treatment, we will call you to review the results.   Testing/Procedures: None   Follow-Up: At [facility], you and your health needs are our priority.  As part of our continuing mission to provide you with exceptional heart care, we have created designated Provider Care Teams.  These Care Teams include your primary Cardiologist (physician) and Advanced Practice Providers (APPs -  Physician Assistants and Nurse Practitioners) who all work together to provide you with the care you need, when you need it.   Your next appointment:   1 month(s)  The format for your next appointment:   In Person  Provider:   You may see Ida Rogue, MD or Murray Hodgkins, NP

## 2021-07-03 ENCOUNTER — Other Ambulatory Visit: Payer: Self-pay | Admitting: Cardiovascular Disease

## 2021-07-05 DIAGNOSIS — J449 Chronic obstructive pulmonary disease, unspecified: Secondary | ICD-10-CM | POA: Diagnosis not present

## 2021-07-06 ENCOUNTER — Telehealth: Payer: Self-pay

## 2021-07-06 ENCOUNTER — Telehealth: Payer: Self-pay | Admitting: Pulmonary Disease

## 2021-07-06 MED ORDER — IPRATROPIUM-ALBUTEROL 0.5-2.5 (3) MG/3ML IN SOLN: 3.0000 mL | Freq: Four times a day (QID) | RESPIRATORY_TRACT | 11 refills | Status: DC | PRN

## 2021-07-06 MED ORDER — COMPRESSOR/NEBULIZER MISC: 0 refills | Status: DC

## 2021-07-06 NOTE — Telephone Encounter (Signed)
Called and spoke to patient. Patient stated that he was seen by home health nurse yesterday. Nurse suggested nebulizer treatments to help with bronchitis.  He was prescribed prednisone yesterday by [facility]. Recent course of abx 2-3 weeks ago for sinus infection.  C/o prod cough with yellow sputum, burning sensation in chest, sob with exertion and sinus pressure x1w. Denied f/c/s or additional sx. Negative home covid test Friday.  Fully vaccinated against covid and flu.  2L QHS. Spo2 maintaining around 94-95% during the day.  Dr. Patsey Berthold, please advise on nebulizer. thanks

## 2021-07-06 NOTE — Telephone Encounter (Signed)
Pt c/o medication issue:  1. Name of Medication: Isosorbide mononitrate   2. How are you currently taking this medication (dosage and times per day)? 60 mg 1 tablet daily  3. Are you having a reaction (difficulty breathing--STAT)? dizzy  4. What is your medication issue? Berge changed dose last week 30mg  to 60mg 

## 2021-07-06 NOTE — Telephone Encounter (Signed)
Spoke with patient and he reports since taking the increase dose of isosorbide mononitrate (30 mg to 60 mg) he has had dizziness. He states that it was bad one day but better today. Patient stated he would try it again tomorrow to see if it improves but he did want to call us for advice.  He also states that he has had increase in swelling to his abdomen and feet which started yesterday. He did not take furosemide yesterday but did take a dose today. He did not have any weights to report. Instructed him to weigh every day in the morning and keep log of those readings and to call us if he should notice 2 pound weight gain overnight or 5 pounds in one week. He did eat out yesterday and thinks that could be the cause. Also discussed sodium intake, fluids, leg elevation, and compression socks/hose.   Advised I would have Gerald Stabs review and we would call him back with his recommendations. He was very appreciative for the call. He verbalized understanding of instructions, agreeable with plan, and had no further questions at this time.

## 2021-07-06 NOTE — Telephone Encounter (Signed)
This may be augmented due to the fact that he is not on maintenance inhaler.  I would recommend doing DuoNeb 4 times via nebulizer.

## 2021-07-06 NOTE — Telephone Encounter (Signed)
Spoke to patient, who stated that he is not currently taking any maintenance inhalers. He did not feel that inhalers were effective.  He is using albuterol TID

## 2021-07-06 NOTE — Telephone Encounter (Signed)
Patient is aware of below message and voiced his understanding.  Duoneb and nebulizer machine has been sent to preferred pharmacy.  Nothing further needed.

## 2021-07-07 ENCOUNTER — Telehealth: Payer: Self-pay | Admitting: Cardiovascular Disease

## 2021-07-07 NOTE — Addendum Note (Signed)
Addended by: Lamar Laundry on: 07/07/2021 05:26 PM   Modules accepted: Orders

## 2021-07-07 NOTE — Telephone Encounter (Addendum)
Spoke to patient. He reports since last night his heart rate has been in the 40s at rest. Today on the phone his blood pressure is 148/70 and heart rate 49. He states his heart rate will increase to the 60s on exertion but at rest it is back down into the 40s. He is normally at 60-70 range ever at rest so he is concerned. He has not taken any medication this morning so he says he is not dizzy yet. He does feel weak with the low heart rate though. No chest pain, shortness of breath or palpitations though. There is a previous call from yesterday with more information regarding dizziness and swelling of lower extremities that was sent to Murray Hodgkins, NP. Will send this note as well.

## 2021-07-07 NOTE — Telephone Encounter (Signed)
Patient made aware of [name], NPs response and recommendation. Patient is agreeable with the plan. He will stop metoprolol and continue to monitor his symptoms. Patient advised to contact the office if symptoms persist.  Patient verbalized understanding to the instructions given and voiced appreciation for the call.

## 2021-07-07 NOTE — Telephone Encounter (Signed)
STAT if HR is under 50 or over 120 (normal HR is 60-100 beats per minute)  What is your heart rate? 40's sustained since last night   Do you have a log of your heart rate readings (document readings)? Trend 50's baseline   Do you have any other symptoms? Intermittent dizziness see previous phone note attributes to med change

## 2021-07-07 NOTE — Telephone Encounter (Signed)
Re: slow heart rates, stop metoprolol. Re: swelling from prior call - agree w/ using lasix as needed, leg elevation, compression, sodium restriction. Re: dizziness on higher dose of imdur - if this doesn't resolve, we will need to reduce dose.  Dizziness may improve however by stopping metoprolol. Re: elevated BPs - w/ dizziness and slow heart rates, will not make any further adjustments to bp meds, other than stopping metoprolol.

## 2021-07-07 NOTE — Telephone Encounter (Signed)
Patient calling for status update and would like a call today

## 2021-07-08 ENCOUNTER — Emergency Department: Payer: PPO

## 2021-07-08 ENCOUNTER — Inpatient Hospital Stay
Admission: EM | Admit: 2021-07-08 | Discharge: 2021-07-11 | DRG: 291 | Disposition: A | Discharge status: Home / Self Care | Payer: PPO | Attending: Internal Medicine | Admitting: Internal Medicine

## 2021-07-08 DIAGNOSIS — Z7982 Long term (current) use of aspirin: Secondary | ICD-10-CM

## 2021-07-08 DIAGNOSIS — N400 Enlarged prostate without lower urinary tract symptoms: Secondary | ICD-10-CM | POA: Diagnosis not present

## 2021-07-08 DIAGNOSIS — Z20822 Contact with and (suspected) exposure to covid-19: Secondary | ICD-10-CM | POA: Diagnosis not present

## 2021-07-08 DIAGNOSIS — I25810 Atherosclerosis of coronary artery bypass graft(s) without angina pectoris: Secondary | ICD-10-CM | POA: Diagnosis present

## 2021-07-08 DIAGNOSIS — Z8616 Personal history of COVID-19: Secondary | ICD-10-CM

## 2021-07-08 DIAGNOSIS — I5032 Chronic diastolic (congestive) heart failure: Secondary | ICD-10-CM

## 2021-07-08 DIAGNOSIS — G4733 Obstructive sleep apnea (adult) (pediatric): Secondary | ICD-10-CM

## 2021-07-08 DIAGNOSIS — I5033 Acute on chronic diastolic (congestive) heart failure: Secondary | ICD-10-CM | POA: Diagnosis present

## 2021-07-08 DIAGNOSIS — Z833 Family history of diabetes mellitus: Secondary | ICD-10-CM

## 2021-07-08 DIAGNOSIS — I509 Heart failure, unspecified: Secondary | ICD-10-CM

## 2021-07-08 DIAGNOSIS — Z955 Presence of coronary angioplasty implant and graft: Secondary | ICD-10-CM

## 2021-07-08 DIAGNOSIS — I2510 Atherosclerotic heart disease of native coronary artery without angina pectoris: Secondary | ICD-10-CM | POA: Diagnosis present

## 2021-07-08 DIAGNOSIS — Z885 Allergy status to narcotic agent status: Secondary | ICD-10-CM | POA: Diagnosis not present

## 2021-07-08 DIAGNOSIS — Z96651 Presence of right artificial knee joint: Secondary | ICD-10-CM | POA: Diagnosis present

## 2021-07-08 DIAGNOSIS — Z96643 Presence of artificial hip joint, bilateral: Secondary | ICD-10-CM | POA: Diagnosis not present

## 2021-07-08 DIAGNOSIS — Z91041 Radiographic dye allergy status: Secondary | ICD-10-CM | POA: Diagnosis not present

## 2021-07-08 DIAGNOSIS — J439 Emphysema, unspecified: Secondary | ICD-10-CM | POA: Diagnosis present

## 2021-07-08 DIAGNOSIS — R7303 Prediabetes: Secondary | ICD-10-CM | POA: Diagnosis present

## 2021-07-08 DIAGNOSIS — M1710 Unilateral primary osteoarthritis, unspecified knee: Secondary | ICD-10-CM | POA: Diagnosis not present

## 2021-07-08 DIAGNOSIS — E785 Hyperlipidemia, unspecified: Secondary | ICD-10-CM | POA: Diagnosis not present

## 2021-07-08 DIAGNOSIS — Z79899 Other long term (current) drug therapy: Secondary | ICD-10-CM

## 2021-07-08 DIAGNOSIS — Z8249 Family history of ischemic heart disease and other diseases of the circulatory system: Secondary | ICD-10-CM

## 2021-07-08 DIAGNOSIS — J069 Acute upper respiratory infection, unspecified: Secondary | ICD-10-CM | POA: Diagnosis not present

## 2021-07-08 DIAGNOSIS — Z87891 Personal history of nicotine dependence: Secondary | ICD-10-CM

## 2021-07-08 DIAGNOSIS — I130 Hypertensive heart and chronic kidney disease with heart failure and stage 1 through stage 4 chronic kidney disease, or unspecified chronic kidney disease: Principal | ICD-10-CM | POA: Diagnosis present

## 2021-07-08 DIAGNOSIS — K219 Gastro-esophageal reflux disease without esophagitis: Secondary | ICD-10-CM | POA: Diagnosis not present

## 2021-07-08 DIAGNOSIS — I429 Cardiomyopathy, unspecified: Secondary | ICD-10-CM

## 2021-07-08 DIAGNOSIS — I110 Hypertensive heart disease with heart failure: Secondary | ICD-10-CM | POA: Diagnosis not present

## 2021-07-08 DIAGNOSIS — I252 Old myocardial infarction: Secondary | ICD-10-CM

## 2021-07-08 DIAGNOSIS — J811 Chronic pulmonary edema: Secondary | ICD-10-CM | POA: Diagnosis not present

## 2021-07-08 DIAGNOSIS — R0603 Acute respiratory distress: Secondary | ICD-10-CM | POA: Diagnosis not present

## 2021-07-08 DIAGNOSIS — J441 Chronic obstructive pulmonary disease with (acute) exacerbation: Secondary | ICD-10-CM | POA: Diagnosis not present

## 2021-07-08 DIAGNOSIS — N179 Acute kidney failure, unspecified: Secondary | ICD-10-CM | POA: Diagnosis not present

## 2021-07-08 DIAGNOSIS — I739 Peripheral vascular disease, unspecified: Secondary | ICD-10-CM

## 2021-07-08 DIAGNOSIS — J40 Bronchitis, not specified as acute or chronic: Secondary | ICD-10-CM | POA: Diagnosis not present

## 2021-07-08 DIAGNOSIS — R001 Bradycardia, unspecified: Secondary | ICD-10-CM | POA: Diagnosis present

## 2021-07-08 DIAGNOSIS — J449 Chronic obstructive pulmonary disease, unspecified: Secondary | ICD-10-CM | POA: Diagnosis not present

## 2021-07-08 DIAGNOSIS — Z951 Presence of aortocoronary bypass graft: Secondary | ICD-10-CM

## 2021-07-08 DIAGNOSIS — R079 Chest pain, unspecified: Secondary | ICD-10-CM | POA: Diagnosis not present

## 2021-07-08 DIAGNOSIS — R0902 Hypoxemia: Secondary | ICD-10-CM | POA: Diagnosis present

## 2021-07-08 DIAGNOSIS — R0602 Shortness of breath: Secondary | ICD-10-CM | POA: Diagnosis not present

## 2021-07-08 DIAGNOSIS — I25110 Atherosclerotic heart disease of native coronary artery with unstable angina pectoris: Secondary | ICD-10-CM | POA: Diagnosis present

## 2021-07-08 DIAGNOSIS — G629 Polyneuropathy, unspecified: Secondary | ICD-10-CM | POA: Diagnosis present

## 2021-07-08 DIAGNOSIS — Z888 Allergy status to other drugs, medicaments and biological substances status: Secondary | ICD-10-CM | POA: Diagnosis not present

## 2021-07-08 DIAGNOSIS — N183 Chronic kidney disease, stage 3 unspecified: Secondary | ICD-10-CM | POA: Diagnosis present

## 2021-07-08 DIAGNOSIS — I25708 Atherosclerosis of coronary artery bypass graft(s), unspecified, with other forms of angina pectoris: Secondary | ICD-10-CM | POA: Diagnosis not present

## 2021-07-08 DIAGNOSIS — Z841 Family history of disorders of kidney and ureter: Secondary | ICD-10-CM

## 2021-07-08 DIAGNOSIS — I25118 Atherosclerotic heart disease of native coronary artery with other forms of angina pectoris: Secondary | ICD-10-CM | POA: Diagnosis not present

## 2021-07-08 MED ORDER — FUROSEMIDE 10 MG/ML IJ SOLN
20.0000 mg | Freq: Once | INTRAMUSCULAR | Status: AC
  Administered 2021-07-09: 20 mg via INTRAVENOUS
  Filled 2021-07-08: qty 4

## 2021-07-08 NOTE — ED Provider Notes (Signed)
[facility] Emergency Department Provider Note   ____________________________________________   Event Date/Time   First MD Initiated Contact with Patient 07/08/21 2309     (approximate)  I have reviewed the triage vital signs and the nursing notes.   HISTORY  Chief Complaint Shortness of Breath, Cough, Chest Pain, and Wheezing    HPI [name] is a 80 y.o. male who presents to the ED from home with a chief complaint of shortness of breath.  Patient has a history of CHF on 20 mg Lasix daily, COPD, not on home oxygen, CAD, hypertension, hyperlipidemia.  Reports a 1 week history of cough productive of green sputum, congestion, chest tightness and shortness of breath.  Placed on prednisone by his pulmonologist Dr. Patsey Berthold several days ago.  States pulse oximeter at home reported 60%.  Patient presents tonight because he is unable to lay supine.  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 06/2019 Echo (in setting of COVID): EF 35-40%, mild LVH, g1 DD, glob HK; b. 09/2019 Echo: EF 50-55%, Gr1 DD; c. 03/2021 Echo: EF 50-55%, no rwma, mild LVH, GrI DD, nl RV fxn. RVSP 44.52mmHg. Mild-mod dil LA. Triv MR. Mild-mod Ao sclerosis.   Actinic keratosis    Anginal pain ([place])    BENIGN PROSTATIC HYPERTROPHY, WITH URINARY OBSTRUCTION 09/06/2007   CAD s/p CABG    a. 1990 s/p MI-->CABG x 2; b. 2002 s/p BMS to LCX; c. 06/2015 Cath: LM 50ost, LAD 100ost/p, 80m/d, D2 nl, LCX  patent stent, RCA 80p (small), LIMA->LAD nl, VG->D2 nl-->Med Rx; d. 05/2020 MV: fixed apical ant/apical defect. No signif ischemia; e. 04/2021 MV: EF 52%, no ischemia.   Carotid arterial disease ([place])    a. 08/2016 Carotid U/S: <39% bilat.   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 did not tolerate CPAP.   Osteoarthritis    PAD (peripheral artery disease) ([place])    a. 07/2017 LE duplex: RSFA 75-79m, LSFA 75-77m, 50-74d; c. 08/2018 Periph Angio: No signif AoIliac dzs. Mod-sev Ca2+ RSFA w/ diff dzs throughout- 3 vessel runoff. Borderline signif LSFA dzs w/ mod-sev Ca2+ vessels and 3 vessel runoff below the knee-->Med rx.   Pneumonia 07/2014   [facility] hospitalization   Pneumonia due to COVID-19 virus 06/02/2019   Pre-diabetes    PSA, INCREASED 07/09/2008   Spinal stenosis of lumbar region     Patient Active Problem List   Diagnosis Date Noted   Right leg pain 06/22/2021   Nocturnal hypoxia 06/11/2021   Coronary artery disease involving native coronary artery of native heart    Primary hypertension    Unstable angina ([place]) 04/20/2021  CKD (chronic kidney disease) stage 3, GFR 30-59 ml/min (HCC) 02/24/2021   Peripheral neuropathy 02/23/2021   Spondylolisthesis of lumbar region 07/07/2020   COPD (chronic obstructive pulmonary disease) ([place]) 01/14/2020   Coccydynia 09/20/2019   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   Anxiety 06/04/2019   Chronic right shoulder pain 09/10/2018   Fall 03/08/2018   Cervical stenosis of spinal canal 06/05/2017   Pedal edema 06/05/2017   Health maintenance examination 12/06/2016   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/13/2016   Overweight (BMI 25.0-29.9) 07/04/2016   Low vitamin B12 level  01/01/2016   Exertional dyspnea 07/07/2015   Medicare annual wellness visit, subsequent 07/03/2015   Advanced care planning/counseling discussion 07/03/2015   Prediabetes 05/04/2015   Pleuritic chest pain 05/04/2015   Ex-smoker 05/04/2015   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   Spermatocele 04/02/2013   Syncopal vertigo 11/04/2010   Lumbar disc disease with radiculopathy 11/04/2010   CAD, ARTERY BYPASS GRAFT 08/11/2009   PVD (peripheral vascular disease) with claudication ([place]) 08/11/2009   DUPUYTREN'S CONTRACTURE, 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   Carotid stenosis 07/09/2008   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   Benign prostatic hyperplasia with urinary obstruction 09/06/2007   Essential hypertension 04/30/2007   GERD 04/30/2007   Hyperlipidemia 04/26/2007   OA (osteoarthritis) of knee 04/26/2007   DVT, HX OF 04/26/2007    Past Surgical History:  Procedure Laterality Date   ABDOMINAL AORTOGRAM W/LOWER EXTREMITY [name] 09/12/2018   Procedure: ABDOMINAL AORTOGRAM W/LOWER EXTREMITY;  Surgeon: [name], MD;  Location: [facility];  Service: Cardiovascular;  Laterality: [name];   APPENDECTOMY     rupture   BACK SURGERY     1984 and then [facility] one [facility]   CARDIAC CATHETERIZATION [name] 07/07/2015   Procedure: Left Heart Cath and Cors/Grafts Angiography;  Surgeon: [name], MD;  Location: [facility];  Service: Cardiovascular;  Laterality: [name];   CATARACT EXTRACTION, BILATERAL     CERVICAL SPINE SURGERY  12/2016   cervical stenosis [name])   COLONOSCOPY  03/2010   HP polyp, diverticulosis, rpt 10 yrs ([name])   CORONARY ARTERY BYPASS GRAFT  1990   3 vessel    CYSTOSCOPY  12/23/10   [facility]   EYE SURGERY     cataract   JOINT REPLACEMENT     KNEE ARTHROSCOPY Right    x2   LAMINOTOMY  1986   L5/S1 lumbar laminotomy for two ruptured discs/fusion   LUMBAR LAMINECTOMY/DECOMPRESSION MICRODISCECTOMY [name] 07/13/2016   Procedure: LUMBAR TWO-THREE, LUMBAR THREE-FOUR, LUMBAR  FOUR-FIVE LAMINECTOMY AND FORAMINOTOMY;  Surgeon: [name], MD;  Location: [facility];  Service: Neurosurgery;  Laterality: [name];  LAMINECTOMY AND FORAMINOTOMY L2-L3, L3-L4,L4-L5   LUMBAR SPINE SURGERY  06/2020   Bilateral redo laminectomy/laminotomy/foraminotomies/medial facetectomy to decompress the bilateral L4 and L5 nerve roots [name])   TOTAL HIP ARTHROPLASTY Bilateral [phone]   TOTAL KNEE ARTHROPLASTY Right 03/06/2017   Procedure: RIGHT TOTAL KNEE ARTHROPLASTY;  Surgeon: Gaynelle Arabian, MD;  Location: [facility];  Service: Orthopedics;  Laterality: Right;    Prior to Admission medications   Medication Sig Start Date End Date Taking? Authorizing Provider  acetaminophen (TYLENOL) 500 MG tablet Take 500 mg by mouth every 8 (eight) hours as needed.    [provider]  aspirin EC 81 MG EC tablet Take 1 tablet (81 mg total) by mouth daily. 06/26/19   Elgergawy, Silver Huguenin, MD  atorvastatin (LIPITOR) 20 MG tablet Take 1 tablet (20 mg total) by mouth daily. 04/22/21   Amin,  Sumayya, MD  Cyanocobalamin (B-12) 1000 MCG SUBL Place 1 tablet under the tongue daily. 12/07/18   Ria Bush, MD  cyclobenzaprine (FLEXERIL) 5 MG tablet Take 5 mg by mouth 3 (three) times daily as needed. 04/09/21   [provider]  diclofenac Sodium (VOLTAREN) 1 % GEL Apply 2 g topically 4 (four) times daily. 06/22/21   Michela Pitcher, NP  docusate sodium (COLACE) 100 MG capsule Take 100 mg by mouth 2 (two) times daily as needed for mild constipation.    [provider]  ezetimibe (ZETIA) 10 MG tablet Take 1 tablet (10 mg total) by mouth daily. 03/19/21   [name], MD  fluticasone (FLONASE) 50 MCG/ACT nasal spray Place into both nostrils as needed for allergies or rhinitis.    [provider]  furosemide (LASIX) 20 MG tablet TAKE ONE TABLET BY MOUTH EVERY DAY AS NEEDED 07/05/21   [name], MD  gabapentin (NEURONTIN) 600 MG tablet Take 600 mg by mouth 3 (three) times daily.     [provider]  hydrocortisone 2.5 % lotion Apply topically daily as needed.    [provider]  ipratropium-albuterol (DUONEB) 0.5-2.5 (3) MG/3ML SOLN Take 3 mLs by nebulization every 6 (six) hours as needed. 07/06/21   Tyler Pita, MD  isosorbide mononitrate (IMDUR) 60 MG 24 hr tablet Take 1 tablet (60 mg total) by mouth daily. 07/01/21 09/29/21  Theora Gianotti, NP  losartan (COZAAR) 25 MG tablet Take 1 tablet (25 mg total) by mouth daily. 03/19/21   [name], MD  Nebulizers (COMPRESSOR/NEBULIZER) MISC Use as directed. 07/06/21   Tyler Pita, MD  nitroGLYCERIN (NITROSTAT) 0.4 MG SL tablet Place 1 tablet (0.4 mg total) under the tongue every 5 (five) minutes as needed for chest pain. 05/01/21   Ria Bush, MD  omeprazole (PRILOSEC) 20 MG capsule TAKE 1 CAPSULE EVERY DAY 12/29/20   Tyler Pita, MD  OXYGEN Inhale 2 L into the lungs at bedtime.    [provider]  polyethylene glycol (MIRALAX / GLYCOLAX) 17 g packet Take 17 g by mouth daily as needed for mild constipation.     [provider]  PROAIR HFA 108 [phone] Base) MCG/ACT inhaler TAKE 2 PUFFS INTO LUNGS EVERY 6 HOURS ASNEEDED FOR WHEEZING OR SHORTNESS OF BREATH 02/19/21   Ria Bush, MD  ranolazine (RANEXA) 500 MG 12 hr tablet Take 1 tablet (500 mg total) by mouth 2 (two) times daily. 03/19/21   [name], MD  tamsulosin (FLOMAX) 0.4 MG CAPS capsule Take 0.4 mg by mouth at bedtime. 05/21/20   [provider]  tolterodine (DETROL LA) 4 MG 24 hr capsule TAKE 1 CAPSULE EVERY DAY 05/10/21   Stoioff, Ronda Fairly, MD    Allergies Vioxx [rofecoxib], Spiriva respimat [tiotropium bromide monohydrate], Contrast media [iodinated diagnostic agents], and Morphine  Family History  Problem Relation Age of Onset   Stroke Mother    Heart attack Mother    Diabetes Mother    Stroke Father    Heart disease Father    Kidney disease Father        PCKD   Cancer  Sister        throat   Diabetes Brother    Kidney disease Sister        PCKD   Cancer Other        5/7 nephews with lung cancer    Social History Social History   Tobacco Use  Smoking status: Former    Packs/day: 1.00    Years: 25.00    Pack years: 25.00    Types: Cigarettes    Quit date: 08/15/1988    Years since quitting: 32.9   Smokeless tobacco: Never  Vaping Use   Vaping Use: Never used  Substance Use Topics   Alcohol use: No    Alcohol/week: 0.0 standard drinks   Drug use: No    Review of Systems  Constitutional: No fever/chills Eyes: No visual changes. ENT: No sore throat. Cardiovascular: Positive for chest pain. Respiratory: Positive for cough and shortness of breath. Gastrointestinal: No abdominal pain.  No nausea, no vomiting.  No diarrhea.  No constipation. Genitourinary: Negative for dysuria. Musculoskeletal: Negative for back pain. Skin: Negative for rash. Neurological: Negative for headaches, focal weakness or numbness.   ____________________________________________   PHYSICAL EXAM:  VITAL SIGNS: ED Triage Vitals  Enc Vitals Group     BP 07/08/21 2222 107/69     Pulse Rate 07/08/21 2222 78     Resp 07/08/21 2222 (!) 26     Temp 07/08/21 2222 97.6 F (36.4 C)     Temp Source 07/08/21 2222 Oral     SpO2 07/08/21 2222 95 %     Weight 07/08/21 2223 224 lb 6.9 oz (101.8 kg)     Height --      Head Circumference --      Peak Flow --      Pain Score 07/08/21 2223 2     Pain Loc --      Pain Edu? --      Excl. in [place]? --     Constitutional: Alert and oriented.  Elderly appearing and in mild acute distress. Eyes: Conjunctivae are normal. PERRL. EOMI. Head: Atraumatic. Nose: No congestion/rhinnorhea. Mouth/Throat: Mucous membranes are moist.   Neck: No stridor.   Cardiovascular: Normal rate, regular rhythm. Grossly normal heart sounds.  Good peripheral circulation. Respiratory: Increased respiratory effort.  No retractions. Lungs with  bibasilar rales.  Unable to lay supine. Gastrointestinal: Soft and nontender to light or deep palpation. No distention. No abdominal bruits. No CVA tenderness. Musculoskeletal: No lower extremity tenderness nor edema.  No joint effusions. Neurologic:  Normal speech and language. No gross focal neurologic deficits are appreciated.  Skin:  Skin is warm, dry and intact. No rash noted. Psychiatric: Mood and affect are normal. Speech and behavior are normal.  ____________________________________________   LABS (all labs ordered are listed, but only abnormal results are displayed)  Labs Reviewed  CBC WITH DIFFERENTIAL/PLATELET - Abnormal; Notable for the following components:      Result Value   RBC 4.05 (*)    All other components within normal limits  BRAIN NATRIURETIC PEPTIDE - Abnormal; Notable for the following components:   B Natriuretic Peptide 507.2 (*)    All other components within normal limits  COMPREHENSIVE METABOLIC PANEL - Abnormal; Notable for the following components:   Glucose, Bld 150 (*)    BUN 31 (*)    Creatinine, Ser 1.60 (*)    GFR, Estimated 43 (*)    All other components within normal limits  RESP PANEL BY RT-PCR (FLU A&B, COVID) ARPGX2  TROPONIN I (HIGH SENSITIVITY)  TROPONIN I (HIGH SENSITIVITY)   ____________________________________________  EKG  ED ECG REPORT I, Jaysha Lasure J, the attending physician, personally viewed and interpreted this ECG.   Date: 07/08/2021  EKG Time: 2221  Rate: 67  Rhythm: normal sinus rhythm  Axis: LAD  Intervals:left anterior fascicular  block  ST&T Change: Nonspecific  ____________________________________________  RADIOLOGY I, Aries Kasa J, personally viewed and evaluated these images (plain radiographs) as part of my medical decision making, as well as reviewing the written report by the radiologist.  ED MD interpretation: Mild pulmonary edema  Official radiology report(s): DG Chest 1 View  Result Date:  07/08/2021 CLINICAL DATA:  Pt states that his breathing has worsened over the past week. Pt reports chest pain and sinus pressure. EXAM: CHEST  1 VIEW COMPARISON:  Chest x-ray 04/20/2021 FINDINGS: The heart and mediastinal contours are unchanged. Cardiac surgical changes overlie the mediastinum. Slightly more prominent hilar vascularity. No focal consolidation. Slightly increased interstitial markings. No pleural effusion. No pneumothorax. No acute osseous abnormality. IMPRESSION: Mild pulmonary edema. Electronically Signed   By: Iven Finn M.D.   On: 07/08/2021 22:52    ____________________________________________   PROCEDURES  Procedure(s) performed (including Critical Care):  .1-3 Lead EKG Interpretation Performed by: Paulette Blanch, MD Authorized by: Paulette Blanch, MD     Interpretation: normal     ECG rate:  78   ECG rate assessment: normal     Rhythm: sinus rhythm     Ectopy: none     Conduction: normal   Comments:     Patient placed on cardiac monitor to evaluate for arrhythmias   ____________________________________________   INITIAL IMPRESSION / ASSESSMENT AND PLAN / ED COURSE  As part of my medical decision making, I reviewed the following data within the [facility] notes reviewed and incorporated, Labs reviewed, EKG interpreted, Old chart reviewed, Radiograph reviewed, Discussed with admitting physician, and Notes from prior ED visits     80 year old male presenting with shortness of breath Differential includes, but is not limited to, viral syndrome, bronchitis including COPD exacerbation, pneumonia, reactive airway disease including asthma, CHF including exacerbation with or without pulmonary/interstitial edema, pneumothorax, ACS, thoracic trauma, and pulmonary embolism.   Tachypneic at rest, room air saturation 90%; placed on 2 L nasal cannula oxygen with improvement to 96%.  Will obtain cardiac panel, respiratory panel.  Chest x-ray  demonstrates pulmonary edema; will administer IV Lasix.  Anticipate hospitalization.  Clinical Course as of 07/09/21 0130  Fri Jul 09, 2021  0130 UOP 400cc.  Sats on 2 L nasal cannula oxygen 93%.  Will discuss with hospitalist services for admission. [JS]    Clinical Course User Index [JS] Paulette Blanch, MD     ____________________________________________   FINAL CLINICAL IMPRESSION(S) / ED DIAGNOSES  Final diagnoses:  SOB (shortness of breath)  Acute on chronic congestive heart failure, unspecified heart failure type [facility])     ED Discharge Orders     None        Note:  This document was prepared using Dragon voice recognition software and may include unintentional dictation errors.    Paulette Blanch, MD 07/09/21 [phone]

## 2021-07-08 NOTE — ED Triage Notes (Signed)
Pt states that his breathing has worsened over the past week. Pt reports chest pain and sinus pressure.

## 2021-07-09 DIAGNOSIS — E785 Hyperlipidemia, unspecified: Secondary | ICD-10-CM | POA: Diagnosis present

## 2021-07-09 DIAGNOSIS — N400 Enlarged prostate without lower urinary tract symptoms: Secondary | ICD-10-CM | POA: Diagnosis present

## 2021-07-09 DIAGNOSIS — Z96651 Presence of right artificial knee joint: Secondary | ICD-10-CM | POA: Diagnosis present

## 2021-07-09 DIAGNOSIS — I5032 Chronic diastolic (congestive) heart failure: Secondary | ICD-10-CM | POA: Diagnosis not present

## 2021-07-09 DIAGNOSIS — Z20822 Contact with and (suspected) exposure to covid-19: Secondary | ICD-10-CM | POA: Diagnosis present

## 2021-07-09 DIAGNOSIS — I5033 Acute on chronic diastolic (congestive) heart failure: Secondary | ICD-10-CM

## 2021-07-09 DIAGNOSIS — Z8616 Personal history of COVID-19: Secondary | ICD-10-CM | POA: Diagnosis not present

## 2021-07-09 DIAGNOSIS — K219 Gastro-esophageal reflux disease without esophagitis: Secondary | ICD-10-CM | POA: Diagnosis present

## 2021-07-09 DIAGNOSIS — N189 Chronic kidney disease, unspecified: Secondary | ICD-10-CM

## 2021-07-09 DIAGNOSIS — N179 Acute kidney failure, unspecified: Secondary | ICD-10-CM

## 2021-07-09 DIAGNOSIS — R0602 Shortness of breath: Secondary | ICD-10-CM

## 2021-07-09 DIAGNOSIS — I429 Cardiomyopathy, unspecified: Secondary | ICD-10-CM | POA: Diagnosis present

## 2021-07-09 DIAGNOSIS — Z96643 Presence of artificial hip joint, bilateral: Secondary | ICD-10-CM | POA: Diagnosis present

## 2021-07-09 DIAGNOSIS — I25708 Atherosclerosis of coronary artery bypass graft(s), unspecified, with other forms of angina pectoris: Secondary | ICD-10-CM

## 2021-07-09 DIAGNOSIS — I509 Heart failure, unspecified: Secondary | ICD-10-CM

## 2021-07-09 DIAGNOSIS — I2510 Atherosclerotic heart disease of native coronary artery without angina pectoris: Secondary | ICD-10-CM | POA: Diagnosis present

## 2021-07-09 DIAGNOSIS — J40 Bronchitis, not specified as acute or chronic: Secondary | ICD-10-CM | POA: Diagnosis not present

## 2021-07-09 DIAGNOSIS — Z888 Allergy status to other drugs, medicaments and biological substances status: Secondary | ICD-10-CM | POA: Diagnosis not present

## 2021-07-09 DIAGNOSIS — Z885 Allergy status to narcotic agent status: Secondary | ICD-10-CM | POA: Diagnosis not present

## 2021-07-09 DIAGNOSIS — I739 Peripheral vascular disease, unspecified: Secondary | ICD-10-CM | POA: Diagnosis present

## 2021-07-09 DIAGNOSIS — G4733 Obstructive sleep apnea (adult) (pediatric): Secondary | ICD-10-CM

## 2021-07-09 DIAGNOSIS — Z951 Presence of aortocoronary bypass graft: Secondary | ICD-10-CM | POA: Diagnosis not present

## 2021-07-09 DIAGNOSIS — G629 Polyneuropathy, unspecified: Secondary | ICD-10-CM | POA: Diagnosis present

## 2021-07-09 DIAGNOSIS — Z91041 Radiographic dye allergy status: Secondary | ICD-10-CM | POA: Diagnosis not present

## 2021-07-09 DIAGNOSIS — I252 Old myocardial infarction: Secondary | ICD-10-CM | POA: Diagnosis not present

## 2021-07-09 DIAGNOSIS — R0603 Acute respiratory distress: Secondary | ICD-10-CM | POA: Diagnosis not present

## 2021-07-09 DIAGNOSIS — I25118 Atherosclerotic heart disease of native coronary artery with other forms of angina pectoris: Secondary | ICD-10-CM

## 2021-07-09 DIAGNOSIS — J439 Emphysema, unspecified: Secondary | ICD-10-CM | POA: Diagnosis present

## 2021-07-09 DIAGNOSIS — I130 Hypertensive heart and chronic kidney disease with heart failure and stage 1 through stage 4 chronic kidney disease, or unspecified chronic kidney disease: Secondary | ICD-10-CM | POA: Diagnosis present

## 2021-07-09 DIAGNOSIS — N183 Chronic kidney disease, stage 3 unspecified: Secondary | ICD-10-CM | POA: Diagnosis present

## 2021-07-09 DIAGNOSIS — Z955 Presence of coronary angioplasty implant and graft: Secondary | ICD-10-CM | POA: Diagnosis not present

## 2021-07-09 DIAGNOSIS — J441 Chronic obstructive pulmonary disease with (acute) exacerbation: Secondary | ICD-10-CM

## 2021-07-09 DIAGNOSIS — R7303 Prediabetes: Secondary | ICD-10-CM | POA: Diagnosis present

## 2021-07-09 DIAGNOSIS — J811 Chronic pulmonary edema: Secondary | ICD-10-CM

## 2021-07-09 DIAGNOSIS — J069 Acute upper respiratory infection, unspecified: Secondary | ICD-10-CM | POA: Diagnosis not present

## 2021-07-09 LAB — CBC
HCT: 37.9 % — ABNORMAL LOW (ref 39.0–52.0)
Hemoglobin: 12.8 g/dL — ABNORMAL LOW (ref 13.0–17.0)
MCH: 32.7 pg (ref 26.0–34.0)
MCHC: 33.8 g/dL (ref 30.0–36.0)
MCV: 96.7 fL (ref 80.0–100.0)
Platelets: 179 10*3/uL (ref 150–400)
RBC: 3.92 MIL/uL — ABNORMAL LOW (ref 4.22–5.81)
RDW: 12.8 % (ref 11.5–15.5)
WBC: 7.5 10*3/uL (ref 4.0–10.5)
nRBC: 0 % (ref 0.0–0.2)

## 2021-07-09 LAB — BRAIN NATRIURETIC PEPTIDE: B Natriuretic Peptide: 507.2 pg/mL — ABNORMAL HIGH (ref 0.0–100.0)

## 2021-07-09 LAB — CBC WITH DIFFERENTIAL/PLATELET
Abs Immature Granulocytes: 0.06 10*3/uL (ref 0.00–0.07)
Basophils Absolute: 0 10*3/uL (ref 0.0–0.1)
Basophils Relative: 0 %
Eosinophils Absolute: 0 10*3/uL (ref 0.0–0.5)
Eosinophils Relative: 0 %
HCT: 40.3 % (ref 39.0–52.0)
Hemoglobin: 13.3 g/dL (ref 13.0–17.0)
Immature Granulocytes: 1 %
Lymphocytes Relative: 12 %
Lymphs Abs: 0.9 10*3/uL (ref 0.7–4.0)
MCH: 32.8 pg (ref 26.0–34.0)
MCHC: 33 g/dL (ref 30.0–36.0)
MCV: 99.5 fL (ref 80.0–100.0)
Monocytes Absolute: 0.7 10*3/uL (ref 0.1–1.0)
Monocytes Relative: 10 %
Neutro Abs: 6.1 10*3/uL (ref 1.7–7.7)
Neutrophils Relative %: 77 %
Platelets: 199 10*3/uL (ref 150–400)
RBC: 4.05 MIL/uL — ABNORMAL LOW (ref 4.22–5.81)
RDW: 12.7 % (ref 11.5–15.5)
WBC: 7.8 10*3/uL (ref 4.0–10.5)
nRBC: 0 % (ref 0.0–0.2)

## 2021-07-09 LAB — RESP PANEL BY RT-PCR (FLU A&B, COVID) ARPGX2
Influenza A by PCR: NEGATIVE
Influenza B by PCR: NEGATIVE
SARS Coronavirus 2 by RT PCR: NEGATIVE

## 2021-07-09 LAB — COMPREHENSIVE METABOLIC PANEL
ALT: 19 U/L (ref 0–44)
AST: 19 U/L (ref 15–41)
Albumin: 4.2 g/dL (ref 3.5–5.0)
Alkaline Phosphatase: 48 U/L (ref 38–126)
Anion gap: 8 (ref 5–15)
BUN: 31 mg/dL — ABNORMAL HIGH (ref 8–23)
CO2: 30 mmol/L (ref 22–32)
Calcium: 9.2 mg/dL (ref 8.9–10.3)
Chloride: 100 mmol/L (ref 98–111)
Creatinine, Ser: 1.6 mg/dL — ABNORMAL HIGH (ref 0.61–1.24)
GFR, Estimated: 43 mL/min — ABNORMAL LOW (ref >60)
Glucose, Bld: 150 mg/dL — ABNORMAL HIGH (ref 70–99)
Potassium: 4.1 mmol/L (ref 3.5–5.1)
Sodium: 138 mmol/L (ref 135–145)
Total Bilirubin: 0.7 mg/dL (ref 0.3–1.2)
Total Protein: 7.6 g/dL (ref 6.5–8.1)

## 2021-07-09 LAB — CREATININE, SERUM
Creatinine, Ser: 1.37 mg/dL — ABNORMAL HIGH (ref 0.61–1.24)
GFR, Estimated: 52 mL/min — ABNORMAL LOW (ref >60)

## 2021-07-09 LAB — TROPONIN I (HIGH SENSITIVITY)
Troponin I (High Sensitivity): 10 ng/L (ref <18)
Troponin I (High Sensitivity): 8 ng/L (ref <18)

## 2021-07-09 MED ORDER — SODIUM CHLORIDE 0.9% FLUSH: 3.0000 mL | INTRAVENOUS | Status: DC | PRN

## 2021-07-09 MED ORDER — EZETIMIBE 10 MG PO TABS
10.0000 mg | ORAL_TABLET | Freq: Every day | ORAL | Status: DC
  Administered 2021-07-10 – 2021-07-11 (×2): 10 mg via ORAL
  Filled 2021-07-09 (×2): qty 1

## 2021-07-09 MED ORDER — ASPIRIN EC 81 MG PO TBEC
81.0000 mg | DELAYED_RELEASE_TABLET | Freq: Every day | ORAL | Status: DC
  Administered 2021-07-09 – 2021-07-11 (×3): 81 mg via ORAL
  Filled 2021-07-09 (×3): qty 1

## 2021-07-09 MED ORDER — GUAIFENESIN-DM 100-10 MG/5ML PO SYRP
5.0000 mL | ORAL_SOLUTION | ORAL | Status: DC | PRN
  Administered 2021-07-09 – 2021-07-11 (×5): 5 mL via ORAL
  Filled 2021-07-09 (×5): qty 5

## 2021-07-09 MED ORDER — ONDANSETRON HCL 4 MG/2ML IJ SOLN: 4.0000 mg | Freq: Four times a day (QID) | INTRAMUSCULAR | Status: DC | PRN

## 2021-07-09 MED ORDER — IPRATROPIUM-ALBUTEROL 0.5-2.5 (3) MG/3ML IN SOLN
3.0000 mL | RESPIRATORY_TRACT | Status: DC
Start: 2021-07-09 — End: 2021-07-10
  Administered 2021-07-09 – 2021-07-10 (×5): 3 mL via RESPIRATORY_TRACT
  Filled 2021-07-09 (×5): qty 3

## 2021-07-09 MED ORDER — ATORVASTATIN CALCIUM 20 MG PO TABS: 20.0000 mg | ORAL_TABLET | Freq: Every day | ORAL | Status: DC

## 2021-07-09 MED ORDER — GABAPENTIN 600 MG PO TABS
600.0000 mg | ORAL_TABLET | Freq: Three times a day (TID) | ORAL | Status: DC
  Administered 2021-07-09 – 2021-07-11 (×5): 600 mg via ORAL
  Filled 2021-07-09 (×5): qty 1

## 2021-07-09 MED ORDER — FUROSEMIDE 10 MG/ML IJ SOLN
40.0000 mg | Freq: Two times a day (BID) | INTRAMUSCULAR | Status: DC
  Administered 2021-07-09 (×2): 40 mg via INTRAVENOUS
  Filled 2021-07-09 (×2): qty 4

## 2021-07-09 MED ORDER — TAMSULOSIN HCL 0.4 MG PO CAPS
0.4000 mg | ORAL_CAPSULE | Freq: Every day | ORAL | Status: DC
  Administered 2021-07-09 – 2021-07-10 (×2): 0.4 mg via ORAL
  Filled 2021-07-09 (×2): qty 1

## 2021-07-09 MED ORDER — RANOLAZINE ER 500 MG PO TB12
500.0000 mg | ORAL_TABLET | Freq: Two times a day (BID) | ORAL | Status: DC
  Filled 2021-07-09 (×2): qty 1

## 2021-07-09 MED ORDER — ACETAMINOPHEN 325 MG PO TABS
650.0000 mg | ORAL_TABLET | ORAL | Status: DC | PRN
  Administered 2021-07-09 – 2021-07-10 (×2): 650 mg via ORAL
  Filled 2021-07-09 (×2): qty 2

## 2021-07-09 MED ORDER — ATORVASTATIN CALCIUM 20 MG PO TABS
20.0000 mg | ORAL_TABLET | Freq: Every evening | ORAL | Status: DC
  Administered 2021-07-09 – 2021-07-10 (×2): 20 mg via ORAL
  Filled 2021-07-09 (×2): qty 1

## 2021-07-09 MED ORDER — ISOSORBIDE MONONITRATE ER 30 MG PO TB24
30.0000 mg | ORAL_TABLET | Freq: Every day | ORAL | Status: DC
Start: 2021-07-09 — End: 2021-07-10
  Administered 2021-07-09: 30 mg via ORAL
  Filled 2021-07-09: qty 1

## 2021-07-09 MED ORDER — ENOXAPARIN SODIUM 60 MG/0.6ML IJ SOSY
0.5000 mg/kg | PREFILLED_SYRINGE | INTRAMUSCULAR | Status: DC
  Administered 2021-07-09 – 2021-07-11 (×3): 50 mg via SUBCUTANEOUS
  Filled 2021-07-09 (×2): qty 0.5
  Filled 2021-07-09: qty 0.6

## 2021-07-09 MED ORDER — NITROGLYCERIN 0.4 MG SL SUBL: 0.4000 mg | SUBLINGUAL_TABLET | SUBLINGUAL | Status: DC | PRN

## 2021-07-09 MED ORDER — SODIUM CHLORIDE 0.9 % IV SOLN
500.0000 mg | INTRAVENOUS | Status: AC
  Administered 2021-07-09 – 2021-07-10 (×2): 500 mg via INTRAVENOUS
  Filled 2021-07-09 (×2): qty 500

## 2021-07-09 MED ORDER — SODIUM CHLORIDE 0.9% FLUSH
3.0000 mL | Freq: Two times a day (BID) | INTRAVENOUS | Status: DC
  Administered 2021-07-09 – 2021-07-11 (×4): 3 mL via INTRAVENOUS

## 2021-07-09 MED ORDER — SODIUM CHLORIDE 0.9 % IV SOLN: 250.0000 mL | INTRAVENOUS | Status: DC | PRN

## 2021-07-09 MED ORDER — RANOLAZINE ER 500 MG PO TB12
500.0000 mg | ORAL_TABLET | Freq: Two times a day (BID) | ORAL | Status: DC
  Administered 2021-07-09 – 2021-07-11 (×4): 500 mg via ORAL
  Filled 2021-07-09 (×5): qty 1

## 2021-07-09 MED ORDER — POLYETHYLENE GLYCOL 3350 17 G PO PACK: 17.0000 g | PACK | Freq: Every day | ORAL | Status: DC | PRN

## 2021-07-09 MED ORDER — IPRATROPIUM-ALBUTEROL 0.5-2.5 (3) MG/3ML IN SOLN: 3.0000 mL | Freq: Four times a day (QID) | RESPIRATORY_TRACT | Status: DC | PRN

## 2021-07-09 MED ORDER — LOSARTAN POTASSIUM 25 MG PO TABS
25.0000 mg | ORAL_TABLET | Freq: Every day | ORAL | Status: DC
  Administered 2021-07-09: 25 mg via ORAL
  Filled 2021-07-09: qty 1

## 2021-07-09 NOTE — H&P (Signed)
History and Physical    [name]:[id number] DOB: June 05, 1941 DOA: 07/08/2021  PCP: Ria Bush, MD   Patient coming from: home  I have personally briefly reviewed patient's relevant medical records in [place]  Chief Complaint: shortness of breath  HPI: [name] is a 80 y.o. male with medical history significant for CAD s/p CABG, PAD, cardiomyopathy, HFpEF, who presents to the ED with progressively worsening shortness of breath over the past week associated with orthopnea, cough and sinus congestion.  Cough is productive of yellow phlegm.  He has associated chest pain with coughing.  He recently saw his ENT doctor for sinus congestion and completed a course of prednisone but his symptoms have not improved.  Patient had a few telephone encounters with his primary cardiologist over the past couple days with concerns about lower extremity edema as well as low heart rates in the 40s.  He had been using increased doses of Lasix and he was instructed to hold the metoprolol because of the heart rate.  He presented today because of ongoing symptoms.  He denied chest pain.  He denied fever or chills  ED course: On arrival BP 107/69, pulse 78, respirations 26 O2 sat 90% on room air requiring 2 L to maintain sats 93-95 Blood work: Creatinine 1.60 up from a normal baseline Troponin 10 and BNP 507.  Otherwise unremarkable  EKG, personally viewed and interpreted: NSR at 67 with nonspecific ST-T wave changes  Chest x-ray: Mild pulmonary edema  Patient treated with IV Lasix with urine output 400 mils while in the ED.  Hospitalist consulted for admission.    Review of Systems: As per HPI otherwise all other systems on review of systems negative.    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 06/2019 Echo (in setting of COVID): EF 35-40%, mild LVH, g1 DD, glob HK; b. 09/2019 Echo: EF 50-55%, Gr1 DD; c.  03/2021 Echo: EF 50-55%, no rwma, mild LVH, GrI DD, nl RV fxn. RVSP 44.68mmHg. Mild-mod dil LA. Triv MR. Mild-mod Ao sclerosis.   Actinic keratosis    Anginal pain ([place])    BENIGN PROSTATIC HYPERTROPHY, WITH URINARY OBSTRUCTION 09/06/2007   CAD s/p CABG    a. 1990 s/p MI-->CABG x 2; b. 2002 s/p BMS to LCX; c. 06/2015 Cath: LM 50ost, LAD 100ost/p, 27m/d, D2 nl, LCX  patent stent, RCA 80p (small), LIMA->LAD nl, VG->D2 nl-->Med Rx; d. 05/2020 MV: fixed apical ant/apical defect. No signif ischemia; e. 04/2021 MV: EF 52%, no ischemia.   Carotid arterial disease ([place])    a. 08/2016 Carotid U/S: <39% bilat.   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 did not tolerate CPAP.   Osteoarthritis    PAD (  peripheral artery disease) ([place])    a. 07/2017 LE duplex: RSFA 75-26m, LSFA 75-48m, 50-74d; c. 08/2018 Periph Angio: No signif AoIliac dzs. Mod-sev Ca2+ RSFA w/ diff dzs throughout- 3 vessel runoff. Borderline signif LSFA dzs w/ mod-sev Ca2+ vessels and 3 vessel runoff below the knee-->Med rx.   Pneumonia 07/2014   [facility] hospitalization   Pneumonia due to COVID-19 virus 06/02/2019   Pre-diabetes    PSA, INCREASED 07/09/2008   Spinal stenosis of lumbar region     Past Surgical History:  Procedure Laterality Date   ABDOMINAL AORTOGRAM W/LOWER EXTREMITY [name] 09/12/2018   Procedure: ABDOMINAL AORTOGRAM  W/LOWER EXTREMITY;  Surgeon: [name], MD;  Location: [facility];  Service: Cardiovascular;  Laterality: [name];   APPENDECTOMY     rupture   BACK SURGERY     1984 and then [facility]   CARDIAC CATHETERIZATION [name] 07/07/2015   Procedure: Left Heart Cath and Cors/Grafts Angiography;  Surgeon: [name], MD;  Location: [facility];  Service: Cardiovascular;  Laterality: [name];   CATARACT EXTRACTION, BILATERAL     CERVICAL SPINE SURGERY  12/2016   cervical stenosis [name])   COLONOSCOPY  03/2010   HP polyp, diverticulosis, rpt 10 yrs ([name])   CORONARY ARTERY BYPASS GRAFT  1990   3 vessel    CYSTOSCOPY  12/23/10   [facility]   EYE SURGERY     cataract   JOINT REPLACEMENT     KNEE ARTHROSCOPY Right    x2   LAMINOTOMY  1986   L5/S1 lumbar laminotomy for two ruptured discs/fusion   LUMBAR LAMINECTOMY/DECOMPRESSION MICRODISCECTOMY [name] 07/13/2016   Procedure: LUMBAR TWO-THREE, LUMBAR THREE-FOUR, LUMBAR FOUR-FIVE LAMINECTOMY AND FORAMINOTOMY;  Surgeon: [name], MD;  Location: [place] [facility];  Service: Neurosurgery;  Laterality: [name];  LAMINECTOMY AND FORAMINOTOMY L2-L3, L3-L4,L4-L5   LUMBAR SPINE SURGERY  06/2020   Bilateral redo laminectomy/laminotomy/foraminotomies/medial facetectomy to decompress the bilateral L4 and L5 nerve roots [name])   TOTAL HIP ARTHROPLASTY Bilateral 1017,5102   TOTAL KNEE ARTHROPLASTY Right 03/06/2017   Procedure: RIGHT TOTAL KNEE ARTHROPLASTY;  Surgeon: Gaynelle Arabian, MD;  Location: [facility];  Service: Orthopedics;  Laterality: Right;     reports that he quit smoking about 32 years ago. His smoking use included cigarettes. He has a 25.00 pack-year smoking history. He has never used smokeless tobacco. He reports that he does not drink alcohol and does not use drugs.  Allergies  Allergen Reactions   Vioxx [Rofecoxib] Other (See Comments)    Hemorrhage    Spiriva Respimat [Tiotropium Bromide Monohydrate] Other (See Comments)     Elevated bp   Contrast Media [Iodinated Diagnostic Agents] Itching and Rash    Delayed reaction post abdominal aortagram.    Morphine Nausea Only and Other (See Comments)    Irritability     Family History  Problem Relation Age of Onset   Stroke Mother    Heart attack Mother    Diabetes Mother    Stroke Father    Heart disease Father    Kidney disease Father        PCKD   Cancer Sister        throat   Diabetes Brother    Kidney disease Sister        PCKD   Cancer Other        5/7 nephews with lung cancer      Prior to Admission medications   Medication Sig Start Date End Date Taking? Authorizing  Provider  acetaminophen (TYLENOL) 500 MG tablet Take 500 mg by mouth every 8 (eight) hours as needed.    [provider]  aspirin EC 81 MG EC tablet Take 1 tablet (81 mg total) by mouth daily. 06/26/19   Elgergawy, Silver Huguenin, MD  atorvastatin (LIPITOR) 20 MG tablet Take 1 tablet (20 mg total) by mouth daily. 04/22/21   Lorella Nimrod, MD  Cyanocobalamin (B-12) 1000 MCG SUBL Place 1 tablet under the tongue daily. 12/07/18   Ria Bush, MD  cyclobenzaprine (FLEXERIL) 5 MG tablet Take 5 mg by mouth 3 (three) times daily as needed. 04/09/21   [provider]  diclofenac Sodium (VOLTAREN) 1 % GEL Apply 2 g topically 4 (four) times daily. 06/22/21   Michela Pitcher, NP  docusate sodium (COLACE) 100 MG capsule Take 100 mg by mouth 2 (two) times daily as needed for mild constipation.    [provider]  ezetimibe (ZETIA) 10 MG tablet Take 1 tablet (10 mg total) by mouth daily. 03/19/21   [name], MD  fluticasone (FLONASE) 50 MCG/ACT nasal spray Place into both nostrils as needed for allergies or rhinitis.    [provider]  furosemide (LASIX) 20 MG tablet TAKE ONE TABLET BY MOUTH EVERY DAY AS NEEDED 07/05/21   [name], MD  gabapentin (NEURONTIN) 600 MG tablet Take 600 mg by mouth 3 (three) times daily.    [provider]   hydrocortisone 2.5 % lotion Apply topically daily as needed.    [provider]  ipratropium-albuterol (DUONEB) 0.5-2.5 (3) MG/3ML SOLN Take 3 mLs by nebulization every 6 (six) hours as needed. 07/06/21   Tyler Pita, MD  isosorbide mononitrate (IMDUR) 60 MG 24 hr tablet Take 1 tablet (60 mg total) by mouth daily. 07/01/21 09/29/21  Theora Gianotti, NP  losartan (COZAAR) 25 MG tablet Take 1 tablet (25 mg total) by mouth daily. 03/19/21   [name], MD  Nebulizers (COMPRESSOR/NEBULIZER) MISC Use as directed. 07/06/21   Tyler Pita, MD  nitroGLYCERIN (NITROSTAT) 0.4 MG SL tablet Place 1 tablet (0.4 mg total) under the tongue every 5 (five) minutes as needed for chest pain. 05/01/21   Ria Bush, MD  omeprazole (PRILOSEC) 20 MG capsule TAKE 1 CAPSULE EVERY DAY 12/29/20   Tyler Pita, MD  OXYGEN Inhale 2 L into the lungs at bedtime.    [provider]  polyethylene glycol (MIRALAX / GLYCOLAX) 17 g packet Take 17 g by mouth daily as needed for mild constipation.     [provider]  PROAIR HFA 108 [phone] Base) MCG/ACT inhaler TAKE 2 PUFFS INTO LUNGS EVERY 6 HOURS ASNEEDED FOR WHEEZING OR SHORTNESS OF BREATH 02/19/21   Ria Bush, MD  ranolazine (RANEXA) 500 MG 12 hr tablet Take 1 tablet (500 mg total) by mouth 2 (two) times daily. 03/19/21   [name], MD  tamsulosin (FLOMAX) 0.4 MG CAPS capsule Take 0.4 mg by mouth at bedtime. 05/21/20   [provider]  tolterodine (DETROL LA) 4 MG 24 hr capsule TAKE 1 CAPSULE EVERY DAY 05/10/21   Abbie Sons, MD    Physical Exam: Vitals:   07/08/21 2223 07/09/21 0000 07/09/21 0030 07/09/21 0100  BP:  (!) 151/67 (!) 157/73 (!) 161/66  Pulse:  (!) 51 (!) 59 63  Resp:   17 20  Temp:      TempSrc:      SpO2:  96% 96% 96%  Weight: 101.8 kg  Constitutional: Chronically ill-appearing, oriented x 3 . Not in any apparent distress HEENT:      Head: Normocephalic and  atraumatic.         Eyes: PERLA, EOMI, Conjunctivae are normal. Sclera is non-icteric.       Mouth/Throat: Mucous membranes are moist.       Neck: Supple with no signs of meningismus. Cardiovascular: Regular rate and rhythm. No murmurs, gallops, or rubs. 2+ symmetrical distal pulses are present . No JVD.  2+ LE edema Respiratory: Respiratory effort normal .Lungs sounds clear bilaterally. No wheezes, crackles, or rhonchi.  Gastrointestinal: Soft, non tender, non distended. Positive bowel sounds.  Genitourinary: No CVA tenderness. Musculoskeletal: Nontender with normal range of motion in all extremities. No cyanosis, or erythema of extremities. Neurologic:  Face is symmetric. Moving all extremities. No gross focal neurologic deficits . Skin: Skin is warm, dry.  No rash or ulcers Psychiatric: Mood and affect are appropriate    Labs on Admission: I have personally reviewed following labs and imaging studies  CBC: Recent Labs  Lab 07/08/21 2307  WBC 7.8  NEUTROABS 6.1  HGB 13.3  HCT 40.3  MCV 99.5  PLT 284   Basic Metabolic Panel: Recent Labs  Lab 07/08/21 2307  NA 138  K 4.1  CL 100  CO2 30  GLUCOSE 150*  BUN 31*  CREATININE 1.60*  CALCIUM 9.2   GFR: Estimated Creatinine Clearance: 45.5 mL/min (A) (by C-G formula based on SCr of 1.6 mg/dL (H)). Liver Function Tests: Recent Labs  Lab 07/08/21 2307  AST 19  ALT 19  ALKPHOS 48  BILITOT 0.7  PROT 7.6  ALBUMIN 4.2   No results for input(s): LIPASE, AMYLASE in the last 168 hours. No results for input(s): AMMONIA in the last 168 hours. Coagulation Profile: No results for input(s): INR, PROTIME in the last 168 hours. Cardiac Enzymes: No results for input(s): CKTOTAL, CKMB, CKMBINDEX, TROPONINI in the last 168 hours. BNP (last 3 results) No results for input(s): PROBNP in the last 8760 hours. HbA1C: No results for input(s): HGBA1C in the last 72 hours. CBG: No results for input(s): GLUCAP in the last 168  hours. Lipid Profile: No results for input(s): CHOL, HDL, LDLCALC, TRIG, CHOLHDL, LDLDIRECT in the last 72 hours. Thyroid Function Tests: No results for input(s): TSH, T4TOTAL, FREET4, T3FREE, THYROIDAB in the last 72 hours. Anemia Panel: No results for input(s): VITAMINB12, FOLATE, FERRITIN, TIBC, IRON, RETICCTPCT in the last 72 hours. Urine analysis:    Component Value Date/Time   COLORURINE yellow 07/02/2008 0832   APPEARANCEUR Clear 07/02/2008 0832   LABSPEC 1.015 07/02/2008 0832   PHURINE 5.5 07/02/2008 0832   HGBUR negative 07/02/2008 0832   BILIRUBINUR negative 08/21/2020 1127   KETONESUR negative 08/21/2020 1127   PROTEINUR negative 08/21/2020 1127   UROBILINOGEN negative (A) 08/21/2020 1127   UROBILINOGEN 0.2 07/02/2008 0832   NITRITE Negative 08/21/2020 1127   NITRITE negative 07/02/2008 0832   LEUKOCYTESUR Negative 08/21/2020 1127    Radiological Exams on Admission: DG Chest 1 View  Result Date: 07/08/2021 CLINICAL DATA:  Pt states that his breathing has worsened over the past week. Pt reports chest pain and sinus pressure. EXAM: CHEST  1 VIEW COMPARISON:  Chest x-ray 04/20/2021 FINDINGS: The heart and mediastinal contours are unchanged. Cardiac surgical changes overlie the mediastinum. Slightly more prominent hilar vascularity. No focal consolidation. Slightly increased interstitial markings. No pleural effusion. No pneumothorax. No acute osseous abnormality. IMPRESSION: Mild pulmonary edema. Electronically Signed   By: Thomasena Edis  Mckinley Jewel M.D.   On: 07/08/2021 22:52    Assessment/Plan    Acute on chronic diastolic CHF   Cardiomyopathy ([place]) -Lasix IV - Continue losartan.  Hold beta-blockers pending cardiology input given recent bradycardia to the 40s at home - Daily weights with intake and output monitoring - Echo from 03/22/2021 with EF 50 to 55% and G1 DD - Cardiology consult for assistance with medication management (patient reports bradycardia and also had dizziness  with Imdur)  Upper respiratory tract infection - Supportive care and symptom control with Flonase and antitussives    CAD, s/p CABG - Denies chest pain, EKG nonacute and troponin 10 - Continue aspirin, atorvastatin.  Metoprolol currently on hold - Continue isosorbide with nitroglycerin as needed chest pain   AKI (acute kidney injury) ([place]) - Creatinine 1.6 likely related to increased Lasix dosing - Continue to monitor    PAD (peripheral artery disease) ([facility]) - Continue aspirin and atorvastatin  DVT prophylaxis: Lovenox  Code Status: full code  Family Communication:  none  Disposition Plan: Back to previous home environment Consults called: Cardiology Status:At the time of admission, it appears that the appropriate admission status for this patient is INPATIENT. This is judged to be reasonable and necessary in order to provide the required intensity of service to ensure the patient's safety given the presenting symptoms, physical exam findings, and initial radiographic and laboratory data in the context of their  Comorbid conditions.   Patient requires inpatient status due to high intensity of service, high risk for further deterioration and high frequency of surveillance required.   I certify that at the point of admission it is my clinical judgment that the patient will require inpatient hospital care spanning beyond [place] MD [facility]   07/09/2021, 2:03 AM

## 2021-07-09 NOTE — ED Notes (Signed)
Dr. Rockey Situ and Thurmond Butts, [place] at bedside

## 2021-07-09 NOTE — Consult Note (Signed)
Cardiology Consultation:   Patient ID: [name]; [id number]; December 05, 1940   Admit date: 07/08/2021 Date of Consult: 07/09/2021  Primary Care Provider: Ria Bush, MD Primary Cardiologist: Rockey Situ Primary Electrophysiologist:  None   Patient Profile:   [name] is a 80 y.o. male with a hx of CAD s/p remote 2-vessel CABG in 1990 with subsequent PCI/BMS to the LCx in 2002, PAD with claudication, COVID in 05/2019, COPD, HTN, HLD, spinal stenosis with chronic lower extremity pain and neuropathy, BPH, GERD, and OSA who is being seen today for the evaluation of volume overload at the request of Dr. Jimmye Norman.  History of Present Illness:   Mr. Flegel underwent two-vessel CABG in 1990.  LHC in 2002 with successful PCI/BMS to the LCx.  Most recent cath from 06/2015 showed 2 of 2 patent grafts with a patent LCx stent.  He is known to have small vessel and diffuse RCA disease which has been medically managed with long-acting nitrate.  In 08/2018, he underwent lower extremity arterial angiography, in the setting of ongoing lower extremity pain and abnormal duplex.  This showed diffuse and calcified bilateral SFA disease with three-vessel runoff below the knee bilaterally.  Medical therapy was recommended.  He was admitted to the hospital in 05/2019 with COVID.  Echo at that time demonstrated new LV systolic dysfunction with an EF of 35 to 40%.  Follow-up echo in early 2021 showed improvement in LV systolic function with an EF of 50 to 55%.  He had a low risk stress test in 05/2020 which was performed prior to back surgery.  In 03/2021 he complained of chest tightness and dyspnea.  Subsequent echo demonstrated an EF of 50 to 55% with normal RV systolic function and a PASP of 44.7 mmHg.  Low-dose Lasix was added as needed.  He subsequently underwent Lexiscan MPI in 04/2021, in the setting of admission for unstable angina and dyspnea, which was low risk and without evidence of ischemia.   PFTs in 03/2021 showed mild to moderate obstructive lung disease.  Since then, he has been seen multiple times for sore throat and upper respiratory congestion symptoms.  He has been prescribed prednisone and possibly 2 rounds of antibiotics including doxycycline and Augmentin with continued URI symptoms with cough productive of green to yellow sputum, nasal congestion, and generalized malaise and fatigue.  He was recently seen in the office on 11/17 reporting continued exertional chest tightness and associated dyspnea that improved with rest.  He was noted to have right greater than left lower extremity swelling, which he attributed to a fall off of a boat dock recently.  He was taking Lasix every other day, and occasionally taking it daily.  Imdur was titrated to 60 mg daily.  Weight was stable by office scale.  He subsequently contacted our office and reported bradycardic heart rates into the 40s bpm.  Given this, it was recommended he stop metoprolol.  He also noted continued lower extremity swelling with recommendation to continue Lasix, leg elevation, and compression stockings.  He was admitted to [facility] on 11/25 with continued cough productive of green to yellow sputum, sore throat, nasal congestion, wheezing, and generalized malaise and fatigue.  Afebrile.  Vital signs stable.  Oxygen saturations in the low 90s requiring supplemental oxygen via nasal cannula at 2 L.  High-sensitivity troponin negative x2.  BNP mildly elevated at 507.  Serum creatinine 1.6 with a baseline around 1.3-1.4.  Chest x-ray interpreted as mild pulmonary edema.  EKG  as outlined below.  In the ED, he received 20 mg of IV Lasix and upon admission was placed on IV Lasix 40 mg twice daily.  He continues to cough up thick green sputum and reports he feels similar to how he has felt with prior episodes of bronchitis and COPD exacerbations.  That said, he does feel like he is holding onto some fluid as well with noted abdominal distention  and tighter fitting pants.  No angina, palpitations, dizziness, presyncope, or syncope.  Right lower extremity swelling, stemming from prior fall, is improving.  Of note, recent right lower extremity ultrasound was negative for DVT.  Vigorous urine output in the ED was documented urine output 2.6 L to date.   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 06/2019 Echo (in setting of COVID): EF 35-40%, mild LVH, g1 DD, glob HK; b. 09/2019 Echo: EF 50-55%, Gr1 DD; c. 03/2021 Echo: EF 50-55%, no rwma, mild LVH, GrI DD, nl RV fxn. RVSP 44.68mmHg. Mild-mod dil LA. Triv MR. Mild-mod Ao sclerosis.   Actinic keratosis    Anginal pain ([place])    BENIGN PROSTATIC HYPERTROPHY, WITH URINARY OBSTRUCTION 09/06/2007   CAD s/p CABG    a. 1990 s/p MI-->CABG x 2; b. 2002 s/p BMS to LCX; c. 06/2015 Cath: LM 50ost, LAD 100ost/p, 15m/d, D2 nl, LCX  patent stent, RCA 80p (small), LIMA->LAD nl, VG->D2 nl-->Med Rx; d. 05/2020 MV: fixed apical ant/apical defect. No signif ischemia; e. 04/2021 MV: EF 52%, no ischemia.   Carotid arterial disease ([place])    a. 08/2016 Carotid U/S: <39% bilat.   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 did not tolerate CPAP.   Osteoarthritis    PAD (peripheral artery disease) ([place])    a. 07/2017 LE duplex: RSFA 75-36m, LSFA  75-65m, 50-74d; c. 08/2018 Periph Angio: No signif AoIliac dzs. Mod-sev Ca2+ RSFA w/ diff dzs throughout- 3 vessel runoff. Borderline signif LSFA dzs w/ mod-sev Ca2+ vessels and 3 vessel runoff below the knee-->Med rx.   Pneumonia 07/2014   [facility] hospitalization   Pneumonia due to COVID-19 virus 06/02/2019   Pre-diabetes    PSA, INCREASED 07/09/2008   Spinal stenosis of lumbar region     Past Surgical History:  Procedure Laterality Date   ABDOMINAL AORTOGRAM W/LOWER EXTREMITY [name] 09/12/2018   Procedure: ABDOMINAL AORTOGRAM W/LOWER EXTREMITY;  Surgeon: [name], MD;  Location: [facility];  Service: Cardiovascular;  Laterality: [name];   APPENDECTOMY     rupture   BACK SURGERY     [facility] and then [facility]   CARDIAC CATHETERIZATION [name] 07/07/2015   Procedure: Left Heart Cath and Cors/Grafts Angiography;  Surgeon: [name], MD;  Location: [facility];  Service: Cardiovascular;  Laterality: [name];   CATARACT EXTRACTION, BILATERAL     CERVICAL SPINE SURGERY  12/2016   cervical stenosis [name])   COLONOSCOPY  03/2010   HP polyp, diverticulosis, rpt 10 yrs ([name])   CORONARY ARTERY BYPASS GRAFT  1990   3 vessel    CYSTOSCOPY  12/23/10   [facility]   EYE SURGERY     cataract   JOINT REPLACEMENT     KNEE ARTHROSCOPY Right    x2   LAMINOTOMY  1986   L5/S1 lumbar laminotomy for two ruptured discs/fusion   LUMBAR LAMINECTOMY/DECOMPRESSION MICRODISCECTOMY [name] 07/13/2016   Procedure: LUMBAR TWO-THREE, LUMBAR THREE-FOUR, LUMBAR FOUR-FIVE LAMINECTOMY AND FORAMINOTOMY;  Surgeon: [name], MD;  Location: [facility];  Service: Neurosurgery;  Laterality: [name];  LAMINECTOMY AND FORAMINOTOMY L2-L3, L3-L4,L4-L5   LUMBAR SPINE SURGERY  06/2020   Bilateral redo laminectomy/laminotomy/foraminotomies/medial facetectomy to decompress the bilateral L4 and L5 nerve roots [name])   TOTAL HIP ARTHROPLASTY Bilateral [phone]   TOTAL KNEE ARTHROPLASTY Right 03/06/2017    Procedure: RIGHT TOTAL KNEE ARTHROPLASTY;  Surgeon: Gaynelle Arabian, MD;  Location: [facility];  Service: Orthopedics;  Laterality: Right;     Home Meds: Prior to Admission medications   Medication Sig Start Date End Date Taking? Authorizing Provider  acetaminophen (TYLENOL) 500 MG tablet Take 500 mg by mouth every 8 (eight) hours as needed.    [provider]  aspirin EC 81 MG EC tablet Take 1 tablet (81 mg total) by mouth daily. 06/26/19   Elgergawy, Silver Huguenin, MD  atorvastatin (LIPITOR) 20 MG tablet Take 1 tablet (20 mg total) by mouth daily. 04/22/21   Lorella Nimrod, MD  Cyanocobalamin (B-12) 1000 MCG SUBL Place 1 tablet under the tongue daily. 12/07/18   Ria Bush, MD  cyclobenzaprine (FLEXERIL) 5 MG tablet Take 5 mg by mouth 3 (three) times daily as needed. 04/09/21   [provider]  diclofenac Sodium (VOLTAREN) 1 % GEL Apply 2 g topically 4 (four) times daily. 06/22/21   Michela Pitcher, NP  docusate sodium (COLACE) 100 MG capsule Take 100 mg by mouth 2 (two) times daily as needed for mild constipation.    [provider]  ezetimibe (ZETIA) 10 MG tablet Take 1 tablet (10 mg total) by mouth daily. 03/19/21   [name], MD  fluticasone (FLONASE) 50 MCG/ACT nasal spray Place into both nostrils as needed for allergies or rhinitis.    [provider]  furosemide (LASIX) 20 MG tablet TAKE ONE TABLET BY MOUTH EVERY DAY AS NEEDED 07/05/21   [name], MD  gabapentin (NEURONTIN) 600 MG tablet Take 600 mg by mouth 3 (three) times daily.    [provider]  hydrocortisone 2.5 % lotion Apply topically daily as needed.    [provider]  ipratropium-albuterol (DUONEB) 0.5-2.5 (3) MG/3ML SOLN Take 3 mLs by nebulization every 6 (six) hours as needed. 07/06/21   Tyler Pita, MD  isosorbide mononitrate (IMDUR) 60 MG 24 hr tablet Take 1 tablet (60 mg total) by mouth daily. 07/01/21 09/29/21  Theora Gianotti, NP  losartan  (COZAAR) 25 MG tablet Take 1 tablet (25 mg total) by mouth daily. 03/19/21   [name], MD  Nebulizers (COMPRESSOR/NEBULIZER) MISC Use as directed. 07/06/21   Tyler Pita, MD  nitroGLYCERIN (NITROSTAT) 0.4 MG SL tablet Place 1 tablet (0.4 mg total) under the tongue every 5 (five) minutes as needed for chest pain. 05/01/21   Ria Bush,  MD  omeprazole (PRILOSEC) 20 MG capsule TAKE 1 CAPSULE EVERY DAY 12/29/20   Tyler Pita, MD  OXYGEN Inhale 2 L into the lungs at bedtime.    [provider]  polyethylene glycol (MIRALAX / GLYCOLAX) 17 g packet Take 17 g by mouth daily as needed for mild constipation.     [provider]  predniSONE (DELTASONE) 20 MG tablet Take 40 mg by mouth daily. 07/05/21   [provider]  PROAIR HFA 108 (90 Base) MCG/ACT inhaler TAKE 2 PUFFS INTO LUNGS EVERY 6 HOURS ASNEEDED FOR WHEEZING OR SHORTNESS OF BREATH 02/19/21   Ria Bush, MD  ranolazine (RANEXA) 500 MG 12 hr tablet Take 1 tablet (500 mg total) by mouth 2 (two) times daily. 03/19/21   [name], MD  tamsulosin (FLOMAX) 0.4 MG CAPS capsule Take 0.4 mg by mouth at bedtime. 05/21/20   [provider]  tolterodine (DETROL LA) 4 MG 24 hr capsule TAKE 1 CAPSULE EVERY DAY 05/10/21   Stoioff, Ronda Fairly, MD    Inpatient Medications: Scheduled Meds:  aspirin EC  81 mg Oral Daily   atorvastatin  20 mg Oral Daily   enoxaparin (LOVENOX) injection  0.5 mg/kg Subcutaneous Q24H   furosemide  40 mg Intravenous BID   losartan  25 mg Oral Daily   ranolazine  500 mg Oral BID   sodium chloride flush  3 mL Intravenous Q12H   tamsulosin  0.4 mg Oral QHS   Continuous Infusions:  sodium chloride     PRN Meds: sodium chloride, acetaminophen, guaiFENesin-dextromethorphan, ipratropium-albuterol, nitroGLYCERIN, ondansetron (ZOFRAN) IV, polyethylene glycol, sodium chloride flush  Allergies:   Allergies  Allergen Reactions   Vioxx [Rofecoxib] Other (See Comments)     Hemorrhage    Spiriva Respimat [Tiotropium Bromide Monohydrate] Other (See Comments)    Elevated bp   Contrast Media [Iodinated Diagnostic Agents] Itching and Rash    Delayed reaction post abdominal aortagram.    Morphine Nausea Only and Other (See Comments)    Irritability     Social History:   Social History   Socioeconomic History   Marital status: Married    Spouse name: Not on file   Number of children: Not on file   Years of education: Not on file   Highest education level: Not on file  Occupational History   Not on file  Tobacco Use   Smoking status: Former    Packs/day: 1.00    Years: 25.00    Pack years: 25.00    Types: Cigarettes    Quit date: 08/15/1988    Years since quitting: 32.9   Smokeless tobacco: Never  Vaping Use   Vaping Use: Never used  Substance and Sexual Activity   Alcohol use: No    Alcohol/week: 0.0 standard drinks   Drug use: No   Sexual activity: Yes  Other Topics Concern   Not on file  Social History Narrative   Married, wife [name], for 25 years in 2016. 1 daughter and 3 grandkids from first marriage.    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    Diet: good water, fruits/vegetables daily      OSA eval recommended while hospitalized with Covid 05/2019   Social Determinants of Health   Financial Resource Strain: Low Risk    Difficulty of Paying Living Expenses: Not hard at all  Food Insecurity: No Food Insecurity   Worried About Charity fundraiser in the Last Year: Never true   Ran Out of Food in the Last Year: Never true  Transportation Needs: No Transportation Needs   Lack of Transportation (Medical): No   Lack of Transportation (Non-Medical): No  Physical Activity: Inactive   Days of Exercise per Week: 0 days   Minutes of Exercise per Session: 0 min  Stress: No Stress Concern Present    Feeling of Stress : Not at all  Social Connections: Not on file  Intimate Partner Violence: Not At Risk   Fear of Current or Ex-Partner: No   Emotionally Abused: No   Physically Abused: No   Sexually Abused: No     Family History:   Family History  Problem Relation Age of Onset   Stroke Mother    Heart attack Mother    Diabetes Mother    Stroke Father    Heart disease Father    Kidney disease Father        PCKD   Cancer Sister        throat   Diabetes Brother    Kidney disease Sister        PCKD   Cancer Other        5/7 nephews with lung cancer    ROS:  Review of Systems  Constitutional:  Positive for malaise/fatigue. Negative for chills, diaphoresis, fever and weight loss.  HENT:  Positive for congestion, sinus pain and sore throat.   Eyes:  Negative for discharge and redness.  Respiratory:  Positive for cough, sputum production, shortness of breath and wheezing. Negative for hemoptysis.        Cough productive of thick green to yellow sputum  Cardiovascular:  Positive for leg swelling. Negative for chest pain, palpitations, orthopnea, claudication and PND.  Gastrointestinal:  Negative for abdominal pain, heartburn, nausea and vomiting.       Abdominal swelling  Musculoskeletal:  Positive for falls. Negative for myalgias.  Skin:  Negative for rash.  Neurological:  Positive for weakness. Negative for dizziness, tingling, tremors, sensory change, speech change, focal weakness and loss of consciousness.  Endo/Heme/Allergies:  Does not bruise/bleed easily.  Psychiatric/Behavioral:  Negative for substance abuse. The patient is not nervous/anxious.   All other systems reviewed and are negative.    Physical Exam/Data:   Vitals:   07/09/21 0815 07/09/21 0845 07/09/21 0900 07/09/21 0915  BP:    (!) 131/56  Pulse: 84 94 85 89  Resp: (!) 32 (!) 22 14 15   Temp:      TempSrc:      SpO2: 91% 94% 96% 94%  Weight:        Intake/Output Summary (Last 24 hours) at 07/09/2021  1017 Last data filed at 07/09/2021 1014 Gross per 24 hour  Intake --  Output 2650 ml  Net -2650 ml   Filed Weights   07/08/21 2223 07/09/21 0517  Weight: 101.8 kg 101.8 kg   Body mass index is 30.44 kg/m.   Physical Exam: General: Well developed, well nourished, in no acute distress. Head: Normocephalic, atraumatic, sclera non-icteric, no xanthomas, nares without discharge.  Neck: Negative for carotid bruits. JVD not elevated. Lungs: Diminished and coarse breath sounds bilaterally with expiratory wheezing and cough noted that is productive of green sputum. Breathing is unlabored. Heart: RRR with S1 S2. I/VI systolic murmur RUSB, no rubs, or gallops appreciated. Abdomen: Soft, non-tender, non-distended with normoactive bowel sounds. No hepatomegaly. No rebound/guarding. No  obvious abdominal masses. Msk:  Strength and tone appear normal for age. Extremities: No clubbing or cyanosis.  Trace bilateral pretibial edema edema. Distal pedal pulses are 2+ and equal bilaterally. Neuro: Alert and oriented X 3. No facial asymmetry. No focal deficit. Moves all extremities spontaneously. Psych:  Responds to questions appropriately with a normal affect.   EKG:  The EKG was personally reviewed and demonstrates: NSR, 67 bpm, significant baseline artifact making further interpretation difficult Telemetry:  Telemetry was personally reviewed and demonstrates: SR  Weights: [employer]   07/08/21 2223 07/09/21 0517  Weight: 101.8 kg 101.8 kg    Relevant CV Studies:  Lexiscan MPI 04/2021: Pharmacological myocardial perfusion imaging study with no significant  ischemia Normal wall motion, EF estimated at 52% No EKG changes concerning for ischemia at peak stress or in recovery. Low risk scan __________  2D echo 03/2021: 1. Left ventricular ejection fraction, by estimation, is 50 to 55%. The  left ventricle has low normal function. The left ventricle has no regional  wall motion abnormalities.  There is mild left ventricular hypertrophy.  Left ventricular diastolic  parameters are consistent with Grade I diastolic dysfunction (impaired  relaxation).   2. Right ventricular systolic function is normal. The right ventricular  size is normal. There is mildly elevated pulmonary artery systolic  pressure. The estimated right ventricular systolic pressure is 20.2 mmHg.   3. Left atrial size was mild to moderately dilated.   4. The mitral valve is normal in structure. Trivial mitral valve  regurgitation. No evidence of mitral stenosis.   5. The aortic valve is normal in structure. Aortic valve regurgitation is  not visualized. Mild to moderate aortic valve sclerosis/calcification is  present, without any evidence of aortic stenosis.  Laboratory Data:  Chemistry Recent Labs  Lab 07/08/21 2307 07/09/21 0647  NA 138  --   K 4.1  --   CL 100  --   CO2 30  --   GLUCOSE 150*  --   BUN 31*  --   CREATININE 1.60* 1.37*  CALCIUM 9.2  --   GFRNONAA 43* 52*  ANIONGAP 8  --     Recent Labs  Lab 07/08/21 2307  PROT 7.6  ALBUMIN 4.2  AST 19  ALT 19  ALKPHOS 48  BILITOT 0.7   Hematology Recent Labs  Lab 07/08/21 2307 07/09/21 0647  WBC 7.8 7.5  RBC 4.05* 3.92*  HGB 13.3 12.8*  HCT 40.3 37.9*  MCV 99.5 96.7  MCH 32.8 32.7  MCHC 33.0 33.8  RDW 12.7 12.8  PLT 199 179   Cardiac EnzymesNo results for input(s): TROPONINI in the last 168 hours. No results for input(s): TROPIPOC in the last 168 hours.  BNP Recent Labs  Lab 07/08/21 2307  BNP 507.2*    DDimer No results for input(s): DDIMER in the last 168 hours.  Radiology/Studies:  DG Chest 1 View  Result Date: 07/08/2021 IMPRESSION: Mild pulmonary edema. Electronically Signed   By: Iven Finn M.D.   On: 07/08/2021 22:52    Assessment and Plan:   1. Acute on chronic hypoxic respiratory distress: -Appears to be multifactorial including acute on chronic HFpEF, pulmonary hypertension, COPD exacerbation and  bronchitis -Coughing up thick green/yellow sputum, feels similar to prior episodes of bronchitis  -Wife now with similar symptoms  -Supportive care, Dr. Rockey Situ to discuss escalation of treatment to include COPD exacerbation and bronchitis with IM  2. Acute on chronic HF with improved EF: -Mildly volume up, though suspect  primary driving force is bronchitis and COPD exacerbation at this time -Gentle diuresis with IV Lasix -Recent echo with stable LVSF as outlined above -CHF education -Daily weights -Strict I/O  3. CAD s/p CABG in 1990 s/p BMS to the LCx in 2002 with normal high sensitivity troponin: -No angina, normal high sensitivity troponin x 2 -Recent Lexiscan MPI in 04/2021 without significant ischemia -PTA aspirin, Imdur, ranolazine -No plans for inpatient ischemic evaluation at this time  4. HTN: -Blood pressure well controlled currently -Continue PTA medications as outlined above  5. HLD: -LDL 62 in 02/2021 with normal LFT at that time  -PTA atorvastatin and ezetimibe  6.  Bradycardia: -Noted on recent phone note -Heart rate is normal on telemetry in the ED -Metoprolol on hold for now      For questions or updates, please contact [facility] Please consult [email or web address] for contact info under Cardiology/STEMI.   Signed, Christell Faith, PA-C [place] Pager: [phone] 07/09/2021, 10:17 AM

## 2021-07-09 NOTE — ED Notes (Signed)
Pt. Up at bedside indep. To urinate. Steady.

## 2021-07-09 NOTE — ED Notes (Signed)
Pt standing up at bedside indep. To urinate, steady.

## 2021-07-09 NOTE — ED Notes (Signed)
Pt. Up at bedside independently to urinate.

## 2021-07-09 NOTE — ED Notes (Signed)
Pt.'s stretcher changed out for hospital bed. Pt. Stood at bedside without dyspnea while beds exchanged. Denies any further need currently. Pt. Is conversational. NAD.

## 2021-07-09 NOTE — ED Notes (Signed)
This RN to bedside, pt. Had taken off [place] to when he got up to urinate, O2 sat dropped to 84%. This RN place [place] back on pt. O2 sat rebounded to 95%.

## 2021-07-09 NOTE — Consult Note (Signed)
   Heart Failure Nurse Navigator Note  HFimpEF 50-55%.  Grade 1 diastolic dysfunction.   He presented to the emergency room with productive cough of yellow sputum, worsening shortness of breath, orthopnea and lower extremity edema.  Chest x-ray revealed mild pulmonary edema.  BNP 507.2.  Comorbidities:  COPD Coronary artery disease with coronary artery bypass grafting Peripheral artery disease Hypertension Hyperlipidemia Sleep apnea GERD Osteoarthritis  Medications:  Furosemide 40 mg IV twice a day Aspirin 81 mg daily Losartan 25 mg daily Tamsulosin 0.4 mg at at bedtime Ranexa 500 mg every 12 hours Atorvastatin 20 mg in the evening  Labs:  Sodium 138, potassium 4.1, chloride 100, CO2 30, BUN 31, creatinine 1.6 (1.15 from 2 months ago), albumin 4.2, AST 19 ALT 19, estimated GFR 43.   Initial meeting with patient in the emergency room, he states he is agreeable at this time to discuss heart failure.  He states that he is currently not coughing at this time.  Discussed how he takes care of himself at home, he states that daily he checks his weight, blood pressure and blood sugars.  Discussed symptoms and weight gain to report to physician.  Patient states that Dr. Rockey Situ is just in and talk to them and told him when he is gaining weight to increase his water pill.  He does not use salt or sugar at the table.  States that they do eat in restaurants once in a while, and sometimes will get some fried foods but does not add any salt.  He goes on to state that he feels that he has had a lot more problems since he had COVID in August 2020.  Discussed following up in the outpatient heart failure clinic he has an appointment with Darylene Price on 12 /5 at 1:30 PM.  He was given the living with heart failure teaching booklet along with his own magnet and information low-sodium.  Pricilla Riffle RN [facility]

## 2021-07-09 NOTE — Progress Notes (Signed)
PROGRESS NOTE    [name]  WLN:[id number] DOB: [birth year] DOA: 07/08/2021 PCP: Ria Bush, MD    Assessment & Plan:   Principal Problem:   Acute on chronic diastolic CHF (congestive heart failure) ([place]) Active Problems:   CAD, ARTERY BYPASS GRAFT   PAD (peripheral artery disease) (HCC)   CKD (chronic kidney disease) stage 3, GFR 30-59 ml/min (HCC)   Coronary artery disease involving native coronary artery of native heart   OSA (obstructive sleep apnea)   Cardiomyopathy (HCC)   AKI (acute kidney injury) ([place])   CHF exacerbation (HCC)   Acute on chronic diastolic CHF: continue on IV lasix. Monitor I/Os & daily weights. Continue on losartan. Hx of recent bradycardia, avoid BB & CCB. Continue on tele. CXR shows pulmonary edema. Cardio consulted    Upper respiratory tract infection: continue w/ bronchodilators, incentive spirometry, flutter valve, robitussin & azithromycin. Influenza, COVID19 are both neg   CAD: s/p CABG. Continue on losartan, aspirin, statin & ranexa. Nitro prn    AKI: Cr is trending down from day prior.   PAD: continue on aspirin, statin    DVT prophylaxis: lovenox  Code Status: full  Family Communication: Disposition Plan: depends on PT/OT recs   Level of care: Telemetry Cardiac  Status is: Inpatient  Remains inpatient appropriate because: severity of illness    Consultants:  Cardio   Procedures:   Antimicrobials: azithromycin    Subjective: Pt c/o shortness of breath   Objective: Vitals:   07/09/21 1130 07/09/21 1145 07/09/21 1200 07/09/21 1215  BP: (!) 148/69  (!) 155/68   Pulse: 85 86 83 82  Resp: (!) 21 14 20 20   Temp:      TempSrc:      SpO2: 90% 91% 90% 91%  Weight:        Intake/Output Summary (Last 24 hours) at 07/09/2021 1416 Last data filed at 07/09/2021 1014 Gross per 24 hour  Intake --  Output 2650 ml  Net -2650 ml   Filed Weights   07/08/21 2223 07/09/21 0517  Weight: 101.8 kg 101.8 kg     Examination:  General exam: Appears calm and comfortable  Respiratory system: course breath sounds b/l. Intermittent rales b/l Cardiovascular system: S1 & S2 +. No rubs, gallops or clicks. Gastrointestinal system: Abdomen is nondistended, soft and nontender. Normal bowel sounds heard. Central nervous system: Alert and oriented. Moves all extremities  Psychiatry: Judgement and insight appear normal. Mood & affect appropriate.     Data Reviewed: I have personally reviewed following labs and imaging studies  CBC: Recent Labs  Lab 07/08/21 2307 07/09/21 0647  WBC 7.8 7.5  NEUTROABS 6.1  --   HGB 13.3 12.8*  HCT 40.3 37.9*  MCV 99.5 96.7  PLT 199 740   Basic Metabolic Panel: Recent Labs  Lab 07/08/21 2307 07/09/21 0647  NA 138  --   K 4.1  --   CL 100  --   CO2 30  --   GLUCOSE 150*  --   BUN 31*  --   CREATININE 1.60* 1.37*  CALCIUM 9.2  --    GFR: Estimated Creatinine Clearance: 53.1 mL/min (A) (by C-G formula based on SCr of 1.37 mg/dL (H)). Liver Function Tests: Recent Labs  Lab 07/08/21 2307  AST 19  ALT 19  ALKPHOS 48  BILITOT 0.7  PROT 7.6  ALBUMIN 4.2   No results for input(s): LIPASE, AMYLASE in the last 168 hours. No results for input(s): AMMONIA in the last 168  hours. Coagulation Profile: No results for input(s): INR, PROTIME in the last 168 hours. Cardiac Enzymes: No results for input(s): CKTOTAL, CKMB, CKMBINDEX, TROPONINI in the last 168 hours. BNP (last 3 results) No results for input(s): PROBNP in the last 8760 hours. HbA1C: No results for input(s): HGBA1C in the last 72 hours. CBG: No results for input(s): GLUCAP in the last 168 hours. Lipid Profile: No results for input(s): CHOL, HDL, LDLCALC, TRIG, CHOLHDL, LDLDIRECT in the last 72 hours. Thyroid Function Tests: No results for input(s): TSH, T4TOTAL, FREET4, T3FREE, THYROIDAB in the last 72 hours. Anemia Panel: No results for input(s): VITAMINB12, FOLATE, FERRITIN, TIBC, IRON,  RETICCTPCT in the last 72 hours. Sepsis Labs: No results for input(s): PROCALCITON, LATICACIDVEN in the last 168 hours.  Recent Results (from the past 240 hour(s))  Resp Panel by RT-PCR (Flu A&B, Covid) Nasopharyngeal Swab     Status: None   Collection Time: 07/08/21 11:07 PM   Specimen: Nasopharyngeal Swab; Nasopharyngeal(NP) swabs in vial transport medium  Result Value Ref Range Status   SARS Coronavirus 2 by RT PCR NEGATIVE NEGATIVE Final    Comment: (NOTE) SARS-CoV-2 target nucleic acids are NOT DETECTED.  The SARS-CoV-2 RNA is generally detectable in upper respiratory specimens during the acute phase of infection. The lowest concentration of SARS-CoV-2 viral copies this assay can detect is 138 copies/mL. A negative result does not preclude SARS-Cov-2 infection and should not be used as the sole basis for treatment or other patient management decisions. A negative result may occur with  improper specimen collection/handling, submission of specimen other than nasopharyngeal swab, presence of viral mutation(s) within the areas targeted by this assay, and inadequate number of viral copies(<138 copies/mL). A negative result must be combined with clinical observations, patient history, and epidemiological information. The expected result is Negative.  Fact Sheet for Patients:  EntrepreneurPulse.com.au  Fact Sheet for Healthcare Providers:  IncredibleEmployment.be  This test is no t yet approved or cleared by the [place] FDA and  has been authorized for detection and/or diagnosis of SARS-CoV-2 by FDA under an Emergency Use Authorization (EUA). This EUA will remain  in effect (meaning this test can be used) for the duration of the COVID-19 declaration under Section 564(b)(1) of the Act, 21 U.S.C.section 360bbb-3(b)(1), unless the authorization is terminated  or revoked sooner.       Influenza A by PCR NEGATIVE NEGATIVE Final   Influenza  B by PCR NEGATIVE NEGATIVE Final    Comment: (NOTE) The Xpert Xpress SARS-CoV-2/FLU/RSV plus assay is intended as an aid in the diagnosis of influenza from Nasopharyngeal swab specimens and should not be used as a sole basis for treatment. Nasal washings and aspirates are unacceptable for Xpert Xpress SARS-CoV-2/FLU/RSV testing.  Fact Sheet for Patients: EntrepreneurPulse.com.au  Fact Sheet for Healthcare Providers: IncredibleEmployment.be  This test is not yet approved or cleared by the [place] FDA and has been authorized for detection and/or diagnosis of SARS-CoV-2 by FDA under an Emergency Use Authorization (EUA). This EUA will remain in effect (meaning this test can be used) for the duration of the COVID-19 declaration under Section 564(b)(1) of the Act, 21 U.S.C. section 360bbb-3(b)(1), unless the authorization is terminated or revoked.  Performed at [facility], [address], [place], [place] [address]          Radiology Studies: DG Chest 1 View  Result Date: 07/08/2021 CLINICAL DATA:  Pt states that his breathing has worsened over the past week. Pt reports chest pain and sinus  pressure. EXAM: CHEST  1 VIEW COMPARISON:  Chest x-ray 04/20/2021 FINDINGS: The heart and mediastinal contours are unchanged. Cardiac surgical changes overlie the mediastinum. Slightly more prominent hilar vascularity. No focal consolidation. Slightly increased interstitial markings. No pleural effusion. No pneumothorax. No acute osseous abnormality. IMPRESSION: Mild pulmonary edema. Electronically Signed   By: Iven Finn M.D.   On: 07/08/2021 22:52        Scheduled Meds:  aspirin EC  81 mg Oral Daily   atorvastatin  20 mg Oral QPM   enoxaparin (LOVENOX) injection  0.5 mg/kg Subcutaneous Q24H   furosemide  40 mg Intravenous BID   ipratropium-albuterol  3 mL Nebulization Q4H   losartan  25 mg Oral Daily   ranolazine  500 mg Oral BID    sodium chloride flush  3 mL Intravenous Q12H   tamsulosin  0.4 mg Oral QHS   Continuous Infusions:  sodium chloride     azithromycin 500 mg (07/09/21 1221)     LOS: 0 days    Time spent: 30 mins     Wyvonnia Dusky, MD [facility] Pager [phone]  If 7PM-7AM, please contact night-coverage 07/09/2021, 2:16 PM

## 2021-07-09 NOTE — Progress Notes (Signed)
Anticoagulation monitoring(Lovenox):  80 yo male ordered Lovenox 40 mg Q24h    Filed Weights   07/08/21 2223  Weight: 101.8 kg (224 lb 6.9 oz)   BMI  30.44   Lab Results  Component Value Date   CREATININE 1.60 (H) 07/08/2021   CREATININE 1.15 04/21/2021   CREATININE 1.41 (H) 04/20/2021   Estimated Creatinine Clearance: 45.5 mL/min (A) (by C-G formula based on SCr of 1.6 mg/dL (H)). Hemoglobin & Hematocrit     Component Value Date/Time   HGB 13.3 07/08/2021 2307   HGB 14.0 08/31/2018 1046   HCT 40.3 07/08/2021 2307   HCT 39.5 08/31/2018 1046     Per Protocol for Patient with estCrcl > 30 ml/min and BMI > 30, will transition to Lovenox 50 mg Q24h.

## 2021-07-10 ENCOUNTER — Encounter: Payer: Self-pay | Admitting: Internal Medicine

## 2021-07-10 ENCOUNTER — Other Ambulatory Visit: Payer: Self-pay

## 2021-07-10 DIAGNOSIS — J069 Acute upper respiratory infection, unspecified: Secondary | ICD-10-CM

## 2021-07-10 DIAGNOSIS — N179 Acute kidney failure, unspecified: Secondary | ICD-10-CM

## 2021-07-10 DIAGNOSIS — J40 Bronchitis, not specified as acute or chronic: Secondary | ICD-10-CM | POA: Diagnosis not present

## 2021-07-10 DIAGNOSIS — I5032 Chronic diastolic (congestive) heart failure: Secondary | ICD-10-CM | POA: Diagnosis not present

## 2021-07-10 LAB — GLUCOSE, CAPILLARY: Glucose-Capillary: 197 mg/dL — ABNORMAL HIGH (ref 70–99)

## 2021-07-10 LAB — BASIC METABOLIC PANEL
Anion gap: 11 (ref 5–15)
BUN: 40 mg/dL — ABNORMAL HIGH (ref 8–23)
CO2: 30 mmol/L (ref 22–32)
Calcium: 8.6 mg/dL — ABNORMAL LOW (ref 8.9–10.3)
Chloride: 95 mmol/L — ABNORMAL LOW (ref 98–111)
Creatinine, Ser: 2.12 mg/dL — ABNORMAL HIGH (ref 0.61–1.24)
GFR, Estimated: 31 mL/min — ABNORMAL LOW (ref >60)
Glucose, Bld: 163 mg/dL — ABNORMAL HIGH (ref 70–99)
Potassium: 3.5 mmol/L (ref 3.5–5.1)
Sodium: 136 mmol/L (ref 135–145)

## 2021-07-10 LAB — CBC
HCT: 37.1 % — ABNORMAL LOW (ref 39.0–52.0)
Hemoglobin: 12.6 g/dL — ABNORMAL LOW (ref 13.0–17.0)
MCH: 32.8 pg (ref 26.0–34.0)
MCHC: 34 g/dL (ref 30.0–36.0)
MCV: 96.6 fL (ref 80.0–100.0)
Platelets: 188 10*3/uL (ref 150–400)
RBC: 3.84 MIL/uL — ABNORMAL LOW (ref 4.22–5.81)
RDW: 12.8 % (ref 11.5–15.5)
WBC: 10.6 10*3/uL — ABNORMAL HIGH (ref 4.0–10.5)
nRBC: 0 % (ref 0.0–0.2)

## 2021-07-10 MED ORDER — IPRATROPIUM-ALBUTEROL 0.5-2.5 (3) MG/3ML IN SOLN
3.0000 mL | Freq: Four times a day (QID) | RESPIRATORY_TRACT | Status: DC
Start: 2021-07-10 — End: 2021-07-11
  Administered 2021-07-10 (×2): 3 mL via RESPIRATORY_TRACT
  Filled 2021-07-10 (×3): qty 3

## 2021-07-10 MED ORDER — AZITHROMYCIN 250 MG PO TABS
500.0000 mg | ORAL_TABLET | Freq: Every day | ORAL | Status: DC
  Administered 2021-07-11: 09:00:00 500 mg via ORAL
  Filled 2021-07-10: qty 2

## 2021-07-10 NOTE — Progress Notes (Signed)
PROGRESS NOTE    [name]  DUK:[id number] DOB: [birth year] DOA: 07/08/2021 PCP: Ria Bush, MD    Assessment & Plan:   Principal Problem:   Acute on chronic diastolic CHF (congestive heart failure) ([place]) Active Problems:   CAD, ARTERY BYPASS GRAFT   PAD (peripheral artery disease) (HCC)   CKD (chronic kidney disease) stage 3, GFR 30-59 ml/min (HCC)   Coronary artery disease involving native coronary artery of native heart   OSA (obstructive sleep apnea)   Cardiomyopathy ([place])   AKI (acute kidney injury) ([place])   CHF exacerbation (HCC)   Acute on chronic diastolic CHF: holding lasix & losartan secondary to elevated Cr. Monitor I/Os & daily weights. Hx of recent bradycardia, avoid BB & CCB. Continue on tele. CXR shows pulmonary edema. Cardio recs apprec    Upper respiratory tract infection: continue on azithromycin, incentive spirometry, flutter valve, robitussion. Influenza, COVID19 are both neg   CAD: s/p CABG. Continue on aspirin, statin & ranexa. Continue to hold losartan. Nitro prn    AKI: Cr continues to trend up today. Hold lasix. Will continue to monitor    PAD: continue on statin, aspirin    DVT prophylaxis: lovenox  Code Status: full  Family Communication: Disposition Plan: depends on PT/OT recs   Level of care: Telemetry Cardiac  Status is: Inpatient  Remains inpatient appropriate because: severity of illness    Consultants:  Cardio   Procedures:   Antimicrobials: azithromycin    Subjective: Pt c/o malaise  Objective: Vitals:   07/10/21 0400 07/10/21 0610 07/10/21 0815 07/10/21 1111  BP:   (!) 115/46 138/60  Pulse:   98 85  Resp:  (!) 21 18 18   Temp:  98.3 F (36.8 C) 98.1 F (36.7 C) 98.1 F (36.7 C)  TempSrc:  Oral    SpO2:   91% 95%  Weight: 97 kg     Height:        Intake/Output Summary (Last 24 hours) at 07/10/2021 1355 Last data filed at 07/10/2021 1110 Gross per 24 hour  Intake 923 ml  Output 1400 ml  Net  -477 ml   Filed Weights   07/09/21 0517 07/09/21 2040 07/10/21 0400  Weight: 101.8 kg 97.5 kg 97 kg    Examination:  General exam: Appears comfortable   Respiratory system: diminished breath sounds b/l  Cardiovascular system: S1/S2+. No gallops or clicks  Gastrointestinal system: Abd is soft, NT, ND & hypoactive bowel sounds Central nervous system: Alert and oriented. Moves all extremities  Psychiatry: judgement and insight appears normal. Appropriate mood and affect     Data Reviewed: I have personally reviewed following labs and imaging studies  CBC: Recent Labs  Lab 07/08/21 2307 07/09/21 0647 07/10/21 0458  WBC 7.8 7.5 10.6*  NEUTROABS 6.1  --   --   HGB 13.3 12.8* 12.6*  HCT 40.3 37.9* 37.1*  MCV 99.5 96.7 96.6  PLT 199 179 376   Basic Metabolic Panel: Recent Labs  Lab 07/08/21 2307 07/09/21 0647 07/10/21 0458  NA 138  --  136  K 4.1  --  3.5  CL 100  --  95*  CO2 30  --  30  GLUCOSE 150*  --  163*  BUN 31*  --  40*  CREATININE 1.60* 1.37* 2.12*  CALCIUM 9.2  --  8.6*   GFR: Estimated Creatinine Clearance: 33.6 mL/min (A) (by C-G formula based on SCr of 2.12 mg/dL (H)). Liver Function Tests: Recent Labs  Lab 07/08/21 2307  AST 19  ALT 19  ALKPHOS 48  BILITOT 0.7  PROT 7.6  ALBUMIN 4.2   No results for input(s): LIPASE, AMYLASE in the last 168 hours. No results for input(s): AMMONIA in the last 168 hours. Coagulation Profile: No results for input(s): INR, PROTIME in the last 168 hours. Cardiac Enzymes: No results for input(s): CKTOTAL, CKMB, CKMBINDEX, TROPONINI in the last 168 hours. BNP (last 3 results) No results for input(s): PROBNP in the last 8760 hours. HbA1C: No results for input(s): HGBA1C in the last 72 hours. CBG: No results for input(s): GLUCAP in the last 168 hours. Lipid Profile: No results for input(s): CHOL, HDL, LDLCALC, TRIG, CHOLHDL, LDLDIRECT in the last 72 hours. Thyroid Function Tests: No results for input(s): TSH,  T4TOTAL, FREET4, T3FREE, THYROIDAB in the last 72 hours. Anemia Panel: No results for input(s): VITAMINB12, FOLATE, FERRITIN, TIBC, IRON, RETICCTPCT in the last 72 hours. Sepsis Labs: No results for input(s): PROCALCITON, LATICACIDVEN in the last 168 hours.  Recent Results (from the past 240 hour(s))  Resp Panel by RT-PCR (Flu A&B, Covid) Nasopharyngeal Swab     Status: None   Collection Time: 07/08/21 11:07 PM   Specimen: Nasopharyngeal Swab; Nasopharyngeal(NP) swabs in vial transport medium  Result Value Ref Range Status   SARS Coronavirus 2 by RT PCR NEGATIVE NEGATIVE Final    Comment: (NOTE) SARS-CoV-2 target nucleic acids are NOT DETECTED.  The SARS-CoV-2 RNA is generally detectable in upper respiratory specimens during the acute phase of infection. The lowest concentration of SARS-CoV-2 viral copies this assay can detect is 138 copies/mL. A negative result does not preclude SARS-Cov-2 infection and should not be used as the sole basis for treatment or other patient management decisions. A negative result may occur with  improper specimen collection/handling, submission of specimen other than nasopharyngeal swab, presence of viral mutation(s) within the areas targeted by this assay, and inadequate number of viral copies(<138 copies/mL). A negative result must be combined with clinical observations, patient history, and epidemiological information. The expected result is Negative.  Fact Sheet for Patients:  EntrepreneurPulse.com.au  Fact Sheet for Healthcare Providers:  IncredibleEmployment.be  This test is no t yet approved or cleared by the [place] FDA and  has been authorized for detection and/or diagnosis of SARS-CoV-2 by FDA under an Emergency Use Authorization (EUA). This EUA will remain  in effect (meaning this test can be used) for the duration of the COVID-19 declaration under Section 564(b)(1) of the Act, 21 U.S.C.section  360bbb-3(b)(1), unless the authorization is terminated  or revoked sooner.       Influenza A by PCR NEGATIVE NEGATIVE Final   Influenza B by PCR NEGATIVE NEGATIVE Final    Comment: (NOTE) The Xpert Xpress SARS-CoV-2/FLU/RSV plus assay is intended as an aid in the diagnosis of influenza from Nasopharyngeal swab specimens and should not be used as a sole basis for treatment. Nasal washings and aspirates are unacceptable for Xpert Xpress SARS-CoV-2/FLU/RSV testing.  Fact Sheet for Patients: EntrepreneurPulse.com.au  Fact Sheet for Healthcare Providers: IncredibleEmployment.be  This test is not yet approved or cleared by the [place] FDA and has been authorized for detection and/or diagnosis of SARS-CoV-2 by FDA under an Emergency Use Authorization (EUA). This EUA will remain in effect (meaning this test can be used) for the duration of the COVID-19 declaration under Section 564(b)(1) of the Act, 21 U.S.C. section 360bbb-3(b)(1), unless the authorization is terminated or revoked.  Performed at [facility], [address], [place], [place] [address]  Radiology Studies: DG Chest 1 View  Result Date: 07/08/2021 CLINICAL DATA:  Pt states that his breathing has worsened over the past week. Pt reports chest pain and sinus pressure. EXAM: CHEST  1 VIEW COMPARISON:  Chest x-ray 04/20/2021 FINDINGS: The heart and mediastinal contours are unchanged. Cardiac surgical changes overlie the mediastinum. Slightly more prominent hilar vascularity. No focal consolidation. Slightly increased interstitial markings. No pleural effusion. No pneumothorax. No acute osseous abnormality. IMPRESSION: Mild pulmonary edema. Electronically Signed   By: Iven Finn M.D.   On: 07/08/2021 22:52        Scheduled Meds:  aspirin EC  81 mg Oral Daily   atorvastatin  20 mg Oral QPM   [START ON 07/11/2021] azithromycin  500 mg Oral Daily    enoxaparin (LOVENOX) injection  0.5 mg/kg Subcutaneous Q24H   ezetimibe  10 mg Oral Daily   gabapentin  600 mg Oral TID   ipratropium-albuterol  3 mL Nebulization Q6H   ranolazine  500 mg Oral BID   sodium chloride flush  3 mL Intravenous Q12H   tamsulosin  0.4 mg Oral QHS   Continuous Infusions:  sodium chloride       LOS: 1 day    Time spent: 25 mins     Wyvonnia Dusky, MD [facility] Pager [phone]  If 7PM-7AM, please contact night-coverage 07/10/2021, 1:55 PM

## 2021-07-10 NOTE — Progress Notes (Signed)
Patient stable. No s/s of distress noted. Patient alert and oriented and awaiting 0000 nebulizer treatment. Will continue to monitor.   07/09/21 2359  Assess: MEWS Score  Temp 98.7 F (37.1 C)  BP (!) 99/46  Pulse Rate (!) 101  Resp 19  SpO2 95 %  O2 Device Nasal Cannula  Patient Activity (if Appropriate) In bed  O2 Flow Rate (L/min) 2 L/min  Assess: MEWS Score  MEWS Temp 0  MEWS Systolic 1  MEWS Pulse 1  MEWS RR 0  MEWS LOC 0  MEWS Score 2  MEWS Score Color Yellow  Assess: if the MEWS score is Yellow or Red  Were vital signs taken at a resting state? Yes  Focused Assessment No change from prior assessment  Does the patient meet 2 or more of the SIRS criteria? No  MEWS guidelines implemented *See Row Information* No, previously yellow, continue vital signs every 4 hours  Treat  MEWS Interventions Other (Comment) (Cont. to monitor; VS every 4 hours;)  Pain Scale 0-10  Pain Score 0  Take Vital Signs  Increase Vital Sign Frequency   (Previously yellow;cont VS every 4 hours)  Document  Patient Outcome Other (Comment) (stable;no s/s of distress noted;yellow mews previously on 07/09/21)  Progress note created (see row info) Yes  Assess: SIRS CRITERIA  SIRS Temperature  0  SIRS Pulse 1  SIRS Respirations  0  SIRS WBC 0  SIRS Score Sum  1

## 2021-07-10 NOTE — Progress Notes (Signed)
Progress Note  Patient Name: [name] Date of Encounter: 07/10/2021  Primary Cardiologist: Rockey Situ  Subjective   Cough starting to improve, still productive, has not been looking at the color of sputum since arriving at the floor. Started on azithromycin yesterday. No chest pain or palpitations. Documented UOP 1 L for the past 24 hours with a net negative 2.5 L for the admission. Renal function worse this morning with a trend of 1.37 to 2.12.WBC trending up. BP soft.   Inpatient Medications    Scheduled Meds:  aspirin EC  81 mg Oral Daily   atorvastatin  20 mg Oral QPM   enoxaparin (LOVENOX) injection  0.5 mg/kg Subcutaneous Q24H   ezetimibe  10 mg Oral Daily   furosemide  40 mg Intravenous BID   gabapentin  600 mg Oral TID   ipratropium-albuterol  3 mL Nebulization Q4H   isosorbide mononitrate  30 mg Oral Daily   losartan  25 mg Oral Daily   ranolazine  500 mg Oral BID   sodium chloride flush  3 mL Intravenous Q12H   tamsulosin  0.4 mg Oral QHS   Continuous Infusions:  sodium chloride     azithromycin Stopped (07/09/21 1321)   PRN Meds: sodium chloride, acetaminophen, guaiFENesin-dextromethorphan, nitroGLYCERIN, ondansetron (ZOFRAN) IV, polyethylene glycol, sodium chloride flush   Vital Signs    Vitals:   07/09/21 2359 07/10/21 0355 07/10/21 0400 07/10/21 0610  BP: (!) 99/46 (!) 86/59    Pulse: (!) 101 (!) 106    Resp: 19 17  (!) 21  Temp: 98.7 F (37.1 C) 100.3 F (37.9 C)  98.3 F (36.8 C)  TempSrc: Oral Oral  Oral  SpO2: 95% 97%    Weight:   97 kg   Height:        Intake/Output Summary (Last 24 hours) at 07/10/2021 0750 Last data filed at 07/09/2021 2056 Gross per 24 hour  Intake 1148.71 ml  Output 1700 ml  Net -551.29 ml   Filed Weights   07/09/21 0517 07/09/21 2040 07/10/21 0400  Weight: 101.8 kg 97.5 kg 97 kg    Telemetry    SR with sinus tachycardia - Personally Reviewed  ECG    No new tracings - Personally  Reviewed  Physical Exam   GEN: No acute distress.   Neck: No JVD. Cardiac: RRR, no murmurs, rubs, or gallops.  Respiratory: Coarse breath sounds bilaterally with improving wheezing.  GI: Soft, nontender, non-distended.    MS: No edema; No deformity. Neuro:  Alert and oriented x 3; Nonfocal.  Psych: Normal affect.  Labs    Chemistry Recent Labs  Lab 07/08/21 2307 07/09/21 0647 07/10/21 0458  NA 138  --  136  K 4.1  --  3.5  CL 100  --  95*  CO2 30  --  30  GLUCOSE 150*  --  163*  BUN 31*  --  40*  CREATININE 1.60* 1.37* 2.12*  CALCIUM 9.2  --  8.6*  PROT 7.6  --   --   ALBUMIN 4.2  --   --   AST 19  --   --   ALT 19  --   --   ALKPHOS 48  --   --   BILITOT 0.7  --   --   GFRNONAA 43* 52* 31*  ANIONGAP 8  --  11     Hematology Recent Labs  Lab 07/08/21 2307 07/09/21 0647 07/10/21 0458  WBC 7.8 7.5 10.6*  RBC  4.05* 3.92* 3.84*  HGB 13.3 12.8* 12.6*  HCT 40.3 37.9* 37.1*  MCV 99.5 96.7 96.6  MCH 32.8 32.7 32.8  MCHC 33.0 33.8 34.0  RDW 12.7 12.8 12.8  PLT 199 179 188    Cardiac EnzymesNo results for input(s): TROPONINI in the last 168 hours. No results for input(s): TROPIPOC in the last 168 hours.   BNP Recent Labs  Lab 07/08/21 2307  BNP 507.2*     DDimer No results for input(s): DDIMER in the last 168 hours.   Radiology    DG Chest 1 View  Result Date: 07/08/2021 IMPRESSION: Mild pulmonary edema. Electronically Signed   By: Iven Finn M.D.   On: 07/08/2021 22:52    Cardiac Studies   Lexiscan MPI 04/2021: Pharmacological myocardial perfusion imaging study with no significant  ischemia Normal wall motion, EF estimated at 52% No EKG changes concerning for ischemia at peak stress or in recovery. Low risk scan __________   2D echo 03/2021: 1. Left ventricular ejection fraction, by estimation, is 50 to 55%. The  left ventricle has low normal function. The left ventricle has no regional  wall motion abnormalities. There is mild left  ventricular hypertrophy.  Left ventricular diastolic  parameters are consistent with Grade I diastolic dysfunction (impaired  relaxation).   2. Right ventricular systolic function is normal. The right ventricular  size is normal. There is mildly elevated pulmonary artery systolic  pressure. The estimated right ventricular systolic pressure is 72.5 mmHg.   3. Left atrial size was mild to moderately dilated.   4. The mitral valve is normal in structure. Trivial mitral valve  regurgitation. No evidence of mitral stenosis.   5. The aortic valve is normal in structure. Aortic valve regurgitation is  not visualized. Mild to moderate aortic valve sclerosis/calcification is  present, without any evidence of aortic stenosis.  Patient Profile     80 y.o. male with history of CAD s/p remote 2-vessel CABG in 1990 with subsequent PCI/BMS to the LCx in 2002, PAD with claudication, COVID in 05/2019, COPD, HTN, HLD, spinal stenosis with chronic lower extremity pain and neuropathy, BPH, GERD, and OSA who is being seen today for the evaluation of possible volume overload at the request of Dr. Jimmye Norman.  Assessment & Plan    1. Acute on chronic hypoxic respiratory distress: -Appears to be largely pulmonary in etiology with COPD exacerbation and bronchitis -With diuresis, renal function has worsened -Coughing up thick green/yellow sputum, feels similar to prior episodes of bronchitis  -Wife with similar symptoms  -Supportive care   2. HF with improved EF: -Appears largely euvolemic -Hold IV Lasix with AKI -Recent echo with stable LVSF as outlined above -CHF education -Daily weights -Strict I/O   3. CAD s/p CABG in 1990 s/p BMS to the LCx in 2002 with normal high sensitivity troponin: -No angina, normal high sensitivity troponin x 2 -Recent Lexiscan MPI in 04/2021 without significant ischemia -PTA aspirin and ranolazine -Hold Imdur with hypotension, resume when able -No plans for inpatient ischemic  evaluation at this time   4. HTN: -Blood pressure soft -Hold losartan and IV Lasix   5. HLD: -LDL 62 in 02/2021 with normal LFT at that time  -PTA atorvastatin and ezetimibe   6.  Bradycardia: -Noted on recent phone note -Heart rate is normal on telemetry  -Metoprolol on hold for now  7. AKI: -Hold IV Lasix and losartan -Gentle hydration recommended   8. Suspected COPD exacerbation/bronchitis: -Per IM  For questions or updates, please contact [place] Please consult [email or web address] for contact info under Cardiology/STEMI.    Signed, Christell Faith, PA-C [place] Pager: [phone] 07/10/2021, 7:50 AM

## 2021-07-10 NOTE — Evaluation (Signed)
Occupational Therapy Evaluation Patient Details Name: [name] MRN: [id number] DOB: 03/11/1941 Today's Date: 07/10/2021   History of Present Illness Pt is an 80 y/o M with PMH: CAD s/p CABG 1990, CHF, chronic prostatitis, emphysema, HLD, HTN and OSA who presented d/t SOB, adm for acute on chronic dCHF.   Clinical Impression   Pt seen for OT evaluation this date in setting of acute hospitalization d/t SOB. Pt reports being INDEP at baseline including working in his own personal carpentry shop. Pt presents this date with audible rattling in his lungs and is currently requiring increased time for ADLs/ADL mobility than his baseline (particularly seated LB ADLs d/t compression on trunk with FWD flexion at the hips). Pt demos ability to perform ADL transfers and fxl mobility with no AD with G balance. He is able to amb around room with no sway. He appears close to his functional baseline, no further acute OT needs perceived at this time, nor need for f/u services detected. Will complete order.     Recommendations for follow up therapy are one component of a multi-disciplinary discharge planning process, led by the attending physician.  Recommendations may be updated based on patient status, additional functional criteria and insurance authorization.   Follow Up Recommendations  No OT follow up    Assistance Recommended at Discharge None  Functional Status Assessment  Patient has not had a recent decline in their functional status  Equipment Recommendations  None recommended by OT    Recommendations for Other Services       Precautions / Restrictions Precautions Precautions: Fall Restrictions Weight Bearing Restrictions: No      Mobility Bed Mobility Overal bed mobility: Independent                  Transfers Overall transfer level: Independent                        Balance Overall balance assessment: Mild deficits observed, not formally tested                                          ADL either performed or assessed with clinical judgement   ADL Overall ADL's : Modified independent;At baseline                                             Vision Patient Visual Report: No change from baseline       Perception     Praxis      Pertinent Vitals/Pain Pain Assessment: No/denies pain     Hand Dominance Right   Extremity/Trunk Assessment Upper Extremity Assessment Upper Extremity Assessment: Overall WFL for tasks assessed   Lower Extremity Assessment Lower Extremity Assessment: Overall WFL for tasks assessed       Communication Communication Communication: No difficulties   Cognition Arousal/Alertness: Awake/alert Behavior During Therapy: WFL for tasks assessed/performed Overall Cognitive Status: Within Functional Limits for tasks assessed                                       General Comments       Exercises Other Exercises Other Exercises: OT ed with pt re:  role   Shoulder Instructions      Home Living Family/patient expects to be discharged to:: Private residence Living Arrangements: Spouse/significant other Available Help at Discharge: Family;Available PRN/intermittently Type of Home: House Home Access: Stairs to enter [employer] of Steps: 3 Entrance Stairs-Rails: Can reach both Home Layout: One level     Bathroom Shower/Tub: Astronomer Accessibility: Yes   Home Equipment: Conservation officer, nature (2 wheels);Rollator (4 wheels)          Prior Functioning/Environment Prior Level of Function : Independent/Modified Independent             Mobility Comments: no AD For fxl mobility typically ADLs Comments: INDEP with all I/ADLs including working in his workshop to make carpentry crafts that he then brings to [name] at [facility].        OT Problem List: Decreased activity tolerance      OT Treatment/Interventions:  Self-care/ADL training;Therapeutic activities    OT Goals(Current goals can be found in the care plan section) Acute Rehab OT Goals Patient Stated Goal: to go home OT Goal Formulation: All assessment and education complete, DC therapy  OT Frequency:     Barriers to D/C:            Co-evaluation              AM-PAC OT "6 Clicks" Daily Activity     Outcome Measure Help from another person eating meals?: None Help from another person taking care of personal grooming?: None Help from another person toileting, which includes using toliet, bedpan, or urinal?: None Help from another person bathing (including washing, rinsing, drying)?: None Help from another person to put on and taking off regular upper body clothing?: None Help from another person to put on and taking off regular lower body clothing?: None 6 Click Score: 24   End of Session Equipment Utilized During Treatment: Gait belt Nurse Communication: Mobility status  Activity Tolerance: Patient tolerated treatment well Patient left: Other (comment) (up walking in room)  OT Visit Diagnosis: Muscle weakness (generalized) (M62.81)                Time: 1553-1610 OT Time Calculation (min): 17 min Charges:  OT General Charges $OT Visit: 1 Visit OT Evaluation $OT Eval Low Complexity: [place], MS, OTR/L ascom [phone] 07/10/21, 4:26 PM

## 2021-07-10 NOTE — Progress Notes (Signed)
PT Screen Note  Patient Details Name: [name] MRN: [id number] DOB: 09/13/1940   Cancelled Treatment:    Reason Eval/Treat Not Completed: PT screened, no needs identified, will sign off PT arrived to room and pt was cleaning up in the bathroom w/o need of UE support.  No overt safety issues, etc.  Spoke with pt about medical history along with more recent concerns.  He reports his daughter is regularly checking in and helping as needed.  Pt reports he is still a little weak but feeling much better, has had PT in the past and is confident that he will be able to safely return home w/o further PT needs.  Will complete PT orders per pt request and apparent safety and independence with mobility.  Kreg Shropshire, DPT 07/10/2021, 4:04 PM

## 2021-07-10 NOTE — Progress Notes (Signed)
PHARMACIST - PHYSICIAN COMMUNICATION  CONCERNING: Antibiotic IV to Oral Route Change Policy  RECOMMENDATION: This patient is receiving azithromycin by the intravenous route.  Based on criteria approved by the Pharmacy and Therapeutics Committee, the antibiotic(s) is/are being converted to the equivalent oral dose form(s).   DESCRIPTION: These criteria include: Patient being treated for a respiratory tract infection, urinary tract infection, cellulitis or clostridium difficile associated diarrhea if on metronidazole The patient is not neutropenic and does not exhibit a GI malabsorption state The patient is eating (either orally or via tube) and/or has been taking other orally administered medications for a least 24 hours The patient is improving clinically and has a Tmax < 100.5  If you have questions about this conversion, please contact the [facility]  07/10/21

## 2021-07-11 DIAGNOSIS — I509 Heart failure, unspecified: Secondary | ICD-10-CM

## 2021-07-11 DIAGNOSIS — I5033 Acute on chronic diastolic (congestive) heart failure: Secondary | ICD-10-CM

## 2021-07-11 LAB — CBC
HCT: 36.3 % — ABNORMAL LOW (ref 39.0–52.0)
Hemoglobin: 12.3 g/dL — ABNORMAL LOW (ref 13.0–17.0)
MCH: 32.4 pg (ref 26.0–34.0)
MCHC: 33.9 g/dL (ref 30.0–36.0)
MCV: 95.5 fL (ref 80.0–100.0)
Platelets: 183 10*3/uL (ref 150–400)
RBC: 3.8 MIL/uL — ABNORMAL LOW (ref 4.22–5.81)
RDW: 12.5 % (ref 11.5–15.5)
WBC: 8.8 10*3/uL (ref 4.0–10.5)
nRBC: 0 % (ref 0.0–0.2)

## 2021-07-11 LAB — BASIC METABOLIC PANEL
Anion gap: 9 (ref 5–15)
BUN: 44 mg/dL — ABNORMAL HIGH (ref 8–23)
CO2: 31 mmol/L (ref 22–32)
Calcium: 8.6 mg/dL — ABNORMAL LOW (ref 8.9–10.3)
Chloride: 95 mmol/L — ABNORMAL LOW (ref 98–111)
Creatinine, Ser: 1.99 mg/dL — ABNORMAL HIGH (ref 0.61–1.24)
GFR, Estimated: 33 mL/min — ABNORMAL LOW (ref >60)
Glucose, Bld: 130 mg/dL — ABNORMAL HIGH (ref 70–99)
Potassium: 3.8 mmol/L (ref 3.5–5.1)
Sodium: 135 mmol/L (ref 135–145)

## 2021-07-11 MED ORDER — LOSARTAN POTASSIUM 25 MG PO TABS: 25.0000 mg | ORAL_TABLET | Freq: Every day | ORAL | 3 refills | Status: DC

## 2021-07-11 MED ORDER — AZITHROMYCIN 250 MG PO TABS: 250.0000 mg | ORAL_TABLET | Freq: Every day | ORAL | 0 refills | Status: AC

## 2021-07-11 MED ORDER — IPRATROPIUM-ALBUTEROL 0.5-2.5 (3) MG/3ML IN SOLN
3.0000 mL | Freq: Two times a day (BID) | RESPIRATORY_TRACT | Status: DC
Start: 2021-07-11 — End: 2021-07-11

## 2021-07-11 MED ORDER — FUROSEMIDE 20 MG PO TABS: 20.0000 mg | ORAL_TABLET | Freq: Every day | ORAL | 0 refills | Status: DC | PRN

## 2021-07-11 MED ORDER — IPRATROPIUM-ALBUTEROL 0.5-2.5 (3) MG/3ML IN SOLN: 3.0000 mL | Freq: Four times a day (QID) | RESPIRATORY_TRACT | Status: DC | PRN

## 2021-07-11 MED ORDER — GUAIFENESIN-DM 100-10 MG/5ML PO SYRP: 5.0000 mL | ORAL_SOLUTION | ORAL | 0 refills | Status: AC | PRN

## 2021-07-11 MED ORDER — IPRATROPIUM-ALBUTEROL 0.5-2.5 (3) MG/3ML IN SOLN
3.0000 mL | Freq: Three times a day (TID) | RESPIRATORY_TRACT | Status: DC
Start: 2021-07-11 — End: 2021-07-11
  Administered 2021-07-11: 07:00:00 3 mL via RESPIRATORY_TRACT
  Filled 2021-07-11: qty 3

## 2021-07-11 NOTE — Progress Notes (Signed)
Progress Note  Patient Name: [name] Date of Encounter: 07/11/2021  Primary Cardiologist: Rockey Situ  Subjective   NAEO. Feeling better today.  Inpatient Medications    Scheduled Meds:  aspirin EC  81 mg Oral Daily   atorvastatin  20 mg Oral QPM   azithromycin  500 mg Oral Daily   enoxaparin (LOVENOX) injection  0.5 mg/kg Subcutaneous Q24H   ezetimibe  10 mg Oral Daily   gabapentin  600 mg Oral TID   ipratropium-albuterol  3 mL Nebulization BID   ranolazine  500 mg Oral BID   sodium chloride flush  3 mL Intravenous Q12H   tamsulosin  0.4 mg Oral QHS   Continuous Infusions:  sodium chloride     PRN Meds: sodium chloride, acetaminophen, guaiFENesin-dextromethorphan, ipratropium-albuterol, nitroGLYCERIN, ondansetron (ZOFRAN) IV, polyethylene glycol, sodium chloride flush   Vital Signs    Vitals:   07/11/21 0051 07/11/21 0353 07/11/21 0747 07/11/21 1146  BP: (!) 108/44 (!) 127/59 (!) 135/54 (!) 116/57  Pulse: (!) 102 92 81 88  Resp: 18 16 18 18   Temp: 98.9 F (37.2 C) 98.3 F (36.8 C) 98.6 F (37 C) 98.6 F (37 C)  TempSrc:  Oral    SpO2: 92% 95% 90% 97%  Weight:  99.4 kg    Height:        Intake/Output Summary (Last 24 hours) at 07/11/2021 1243 Last data filed at 07/11/2021 1017 Gross per 24 hour  Intake 1320 ml  Output 950 ml  Net 370 ml    Filed Weights   07/09/21 2040 07/10/21 0400 07/11/21 0353  Weight: 97.5 kg 97 kg 99.4 kg    Telemetry    SR with sinus tachycardia - Personally Reviewed  ECG    Personally Reviewed  Physical Exam   GEN: No acute distress.   Neck: No JVD. Cardiac: RRR, no murmurs, rubs, or gallops.  Respiratory: Improved aeration to bilateral lung bases.  GI: Soft, nontender, non-distended.    MS: No edema; No deformity. Neuro:  Alert and oriented x 3; Nonfocal.  Psych: Normal affect.  Labs    Chemistry Recent Labs  Lab 07/08/21 2307 07/09/21 0647 07/10/21 0458 07/11/21 0355  NA 138  --  136 135  K  4.1  --  3.5 3.8  CL 100  --  95* 95*  CO2 30  --  30 31  GLUCOSE 150*  --  163* 130*  BUN 31*  --  40* 44*  CREATININE 1.60* 1.37* 2.12* 1.99*  CALCIUM 9.2  --  8.6* 8.6*  PROT 7.6  --   --   --   ALBUMIN 4.2  --   --   --   AST 19  --   --   --   ALT 19  --   --   --   ALKPHOS 48  --   --   --   BILITOT 0.7  --   --   --   GFRNONAA 43* 52* 31* 33*  ANIONGAP 8  --  11 9      Hematology Recent Labs  Lab 07/09/21 0647 07/10/21 0458 07/11/21 0355  WBC 7.5 10.6* 8.8  RBC 3.92* 3.84* 3.80*  HGB 12.8* 12.6* 12.3*  HCT 37.9* 37.1* 36.3*  MCV 96.7 96.6 95.5  MCH 32.7 32.8 32.4  MCHC 33.8 34.0 33.9  RDW 12.8 12.8 12.5  PLT 179 188 183     Cardiac EnzymesNo results for input(s): TROPONINI in the last 168  hours. No results for input(s): TROPIPOC in the last 168 hours.   BNP Recent Labs  Lab 07/08/21 2307  BNP 507.2*      DDimer No results for input(s): DDIMER in the last 168 hours.   Radiology    DG Chest 1 View  Result Date: 07/08/2021 IMPRESSION: Mild pulmonary edema. Electronically Signed   By: Iven Finn M.D.   On: 07/08/2021 22:52    Cardiac Studies  No new Patient Profile     80 y.o. male with history of CAD s/p remote 2-vessel CABG in 1990 with subsequent PCI/BMS to the LCx in 2002, PAD with claudication, COVID in 05/2019, COPD, HTN, HLD, spinal stenosis with chronic lower extremity pain and neuropathy, BPH, GERD, and OSA. Initial consult was for volume overload.  Assessment & Plan    1. Acute on chronic hypoxic respiratory distress: Improved. Supportive care.   2. HF with improved EF: -Appears largely euvolemic Restart home lasix tomorrow.   3. CAD s/p CABG in 1990 s/p BMS to the LCx in 2002 with normal high sensitivity troponin: -No angina -No plans for inpatient ischemic evaluation at this time       For questions or updates, please contact [facility] Please consult [email or web address] for contact info under Cardiology/STEMI.     Lysbeth Galas T. Quentin Ore, MD, [facility], [facility] Cardiac Electrophysiology

## 2021-07-11 NOTE — Discharge Summary (Signed)
Physician Discharge Summary  [name] FIE:[id number] DOB: 10/28/1940 DOA: 07/08/2021  PCP: Ria Bush, MD  Admit date: 07/08/2021 Discharge date: 07/11/2021  Admitted From: home  Disposition:  home   Recommendations for Outpatient Follow-up:  Follow up with PCP in 1-2 weeks F/u w/ cardio, Dr. Rockey Situ, in 1-2 weeks   Home Health: no  Equipment/Devices: continue w/ home use of oxygen at night only   Discharge Condition: stable  CODE STATUS: full  Diet recommendation: Heart Healthy  Brief/Interim Summary: HPI was taken from Dr. Damita Dunnings: [name] is a 80 y.o. male with medical history significant for CAD s/p CABG, PAD, cardiomyopathy, HFpEF, who presents to the ED with progressively worsening shortness of breath over the past week associated with orthopnea, cough and sinus congestion.  Cough is productive of yellow phlegm.  He has associated chest pain with coughing.  He recently saw his ENT doctor for sinus congestion and completed a course of prednisone but his symptoms have not improved.  Patient had a few telephone encounters with his primary cardiologist over the past couple days with concerns about lower extremity edema as well as low heart rates in the 40s.  He had been using increased doses of Lasix and he was instructed to hold the metoprolol because of the heart rate.  He presented today because of ongoing symptoms.  He denied chest pain.  He denied fever or chills   ED course: On arrival BP 107/69, pulse 78, respirations 26 O2 sat 90% on room air requiring 2 L to maintain sats 93-95 Blood work: Creatinine 1.60 up from a normal baseline Troponin 10 and BNP 507.  Otherwise unremarkable   EKG, personally viewed and interpreted: NSR at 67 with nonspecific ST-T wave changes   Chest x-ray: Mild pulmonary edema   Patient treated with IV Lasix with urine output 400 mils while in the ED.  Hospitalist consulted for admission.     Hospital course from Dr.  Jimmye Norman 11/25-11/27/22: Pt presented w/ shortness of breath & cough. Pt was found to have CHF exacerbation and URI. Pt was treated w/ IV lasix, incentive spirometry, flutter valve, azithromycin & cough meds. Of note, lasix & losartan was held for 2 days secondary to AKI. Pt will restart home dose of lasix & losartan on 07/12/21. Cr was trending down to 1.99 on day of d/c. PT/OT evaluated the pt and did not recommend further therapy. For more information, please see previous progress/consult notes.   Discharge Diagnoses:  Principal Problem:   Acute on chronic diastolic CHF (congestive heart failure) (HCC) Active Problems:   CAD, ARTERY BYPASS GRAFT   PAD (peripheral artery disease) (HCC)   CKD (chronic kidney disease) stage 3, GFR 30-59 ml/min (HCC)   Coronary artery disease involving native coronary artery of native heart   OSA (obstructive sleep apnea)   Cardiomyopathy (HCC)   AKI (acute kidney injury) ([place])   CHF exacerbation (HCC)  Acute on chronic diastolic CHF: holding lasix & losartan secondary to elevated Cr. Monitor I/Os & daily weights. Hx of recent bradycardia, avoid BB & CCB. Continue on tele. CXR shows pulmonary edema. Cardio recs apprec    Upper respiratory tract infection: continue on azithromycin, incentive spirometry, flutter valve, robitussion. Influenza, COVID19 are both neg   CAD: s/p CABG. Continue on aspirin, statin & ranexa. Continue to hold losartan. Nitro prn    AKI: Cr is trending down from day prior. Avoid nephrotoxic meds     PAD: continue on statin, aspirin  Discharge Instructions  Discharge Instructions     Diet - low sodium heart healthy   Complete by: As directed    Discharge instructions   Complete by: As directed    F/u w/ PCP in 1-2 weeks. F/u w/ cardio, Dr. Rockey Situ, in 1-2 weeks   Increase activity slowly   Complete by: As directed       Allergies as of 07/11/2021       Reactions   Vioxx [rofecoxib] Other (See Comments)   Hemorrhage     Spiriva Respimat [tiotropium Bromide Monohydrate] Other (See Comments)   Elevated bp   Contrast Media [iodinated Diagnostic Agents] Itching, Rash   Delayed reaction post abdominal aortagram.    Morphine Nausea Only, Other (See Comments)   Irritability        Medication List     STOP taking these medications    cyclobenzaprine 5 MG tablet Commonly known as: FLEXERIL   hydrocortisone 2.5 % lotion       TAKE these medications    acetaminophen 500 MG tablet Commonly known as: TYLENOL Take 500 mg by mouth every 8 (eight) hours as needed.   aspirin 81 MG EC tablet Take 1 tablet (81 mg total) by mouth daily.   atorvastatin 20 MG tablet Commonly known as: LIPITOR Take 1 tablet (20 mg total) by mouth daily.   azithromycin 250 MG tablet Commonly known as: ZITHROMAX Take 1 tablet (250 mg total) by mouth daily for 3 days.   B-12 1000 MCG Subl Place 1 tablet under the tongue daily.   Compressor/Nebulizer Misc Use as directed.   diclofenac Sodium 1 % Gel Commonly known as: Voltaren Apply 2 g topically 4 (four) times daily.   docusate sodium 100 MG capsule Commonly known as: COLACE Take 100 mg by mouth 2 (two) times daily as needed for mild constipation.   ezetimibe 10 MG tablet Commonly known as: ZETIA Take 1 tablet (10 mg total) by mouth daily.   fluticasone 50 MCG/ACT nasal spray Commonly known as: FLONASE Place into both nostrils as needed for allergies or rhinitis.   furosemide 20 MG tablet Commonly known as: LASIX Take 1 tablet (20 mg total) by mouth daily as needed. Restart taking this medication tomorrow 07/12/21 What changed: additional instructions   gabapentin 600 MG tablet Commonly known as: NEURONTIN Take 600 mg by mouth 3 (three) times daily.   guaiFENesin-dextromethorphan 100-10 MG/5ML syrup Commonly known as: ROBITUSSIN DM Take 5 mLs by mouth every 4 (four) hours as needed for up to 7 days for cough.   ipratropium-albuterol 0.5-2.5 (3)  MG/3ML Soln Commonly known as: DUONEB Take 3 mLs by nebulization every 6 (six) hours as needed.   isosorbide mononitrate 60 MG 24 hr tablet Commonly known as: IMDUR Take 1 tablet (60 mg total) by mouth daily.   losartan 25 MG tablet Commonly known as: COZAAR Take 1 tablet (25 mg total) by mouth daily. Restart taking this medication tomorrow 07/12/21 What changed: additional instructions   nitroGLYCERIN 0.4 MG SL tablet Commonly known as: NITROSTAT Place 1 tablet (0.4 mg total) under the tongue every 5 (five) minutes as needed for chest pain.   omeprazole 20 MG capsule Commonly known as: PRILOSEC TAKE 1 CAPSULE EVERY DAY What changed: when to take this   OXYGEN Inhale 2 L into the lungs at bedtime.   polyethylene glycol 17 g packet Commonly known as: MIRALAX / GLYCOLAX Take 17 g by mouth daily as needed for mild constipation.   predniSONE 20 MG  tablet Commonly known as: DELTASONE Take 40 mg by mouth daily.   ProAir HFA 108 (90 Base) MCG/ACT inhaler Generic drug: albuterol TAKE 2 PUFFS INTO LUNGS EVERY 6 HOURS ASNEEDED FOR WHEEZING OR SHORTNESS OF BREATH   ranolazine 500 MG 12 hr tablet Commonly known as: RANEXA Take 1 tablet (500 mg total) by mouth 2 (two) times daily.   tamsulosin 0.4 MG Caps capsule Commonly known as: FLOMAX Take 0.4 mg by mouth at bedtime.   tolterodine 4 MG 24 hr capsule Commonly known as: DETROL LA TAKE 1 CAPSULE EVERY DAY        Allergies  Allergen Reactions   Vioxx [Rofecoxib] Other (See Comments)    Hemorrhage    Spiriva Respimat [Tiotropium Bromide Monohydrate] Other (See Comments)    Elevated bp   Contrast Media [Iodinated Diagnostic Agents] Itching and Rash    Delayed reaction post abdominal aortagram.    Morphine Nausea Only and Other (See Comments)    Irritability     Consultations: Cardio    Procedures/Studies: DG Chest 1 View  Result Date: 07/08/2021 CLINICAL DATA:  Pt states that his breathing has worsened over  the past week. Pt reports chest pain and sinus pressure. EXAM: CHEST  1 VIEW COMPARISON:  Chest x-ray 04/20/2021 FINDINGS: The heart and mediastinal contours are unchanged. Cardiac surgical changes overlie the mediastinum. Slightly more prominent hilar vascularity. No focal consolidation. Slightly increased interstitial markings. No pleural effusion. No pneumothorax. No acute osseous abnormality. IMPRESSION: Mild pulmonary edema. Electronically Signed   By: Iven Finn M.D.   On: 07/08/2021 22:52   US Venous Img Lower Unilateral Right  Result Date: 06/11/2021 CLINICAL DATA:  Right lower extremity pain and swelling EXAM: RIGHT LOWER EXTREMITY VENOUS DOPPLER ULTRASOUND TECHNIQUE: Gray-scale sonography with graded compression, as well as color Doppler and duplex ultrasound were performed to evaluate the lower extremity deep venous systems from the level of the common femoral vein and including the common femoral, femoral, profunda femoral, popliteal and calf veins including the posterior tibial, peroneal and gastrocnemius veins when visible. The superficial great saphenous vein was also interrogated. Spectral Doppler was utilized to evaluate flow at rest and with distal augmentation maneuvers in the common femoral, femoral and popliteal veins. COMPARISON:  None. FINDINGS: Contralateral Common Femoral Vein: Respiratory phasicity is normal and symmetric with the symptomatic side. No evidence of thrombus. Normal compressibility. Common Femoral Vein: No evidence of thrombus. Normal compressibility, respiratory phasicity and response to augmentation. Saphenofemoral Junction: No evidence of thrombus. Normal compressibility and flow on color Doppler imaging. Profunda Femoral Vein: No evidence of thrombus. Normal compressibility and flow on color Doppler imaging. Femoral Vein: No evidence of thrombus. Normal compressibility, respiratory phasicity and response to augmentation. Popliteal Vein: No evidence of thrombus.  Normal compressibility, respiratory phasicity and response to augmentation. Calf Veins: No evidence of thrombus. Normal compressibility and flow on color Doppler imaging. IMPRESSION: No evidence of deep venous thrombosis. Electronically Signed   By: Jerilynn Mages.  Shick M.D.   On: 06/11/2021 12:24   (Echo, Carotid, EGD, Colonoscopy, ERCP)    Subjective: Pt c/o intermittent cough    Discharge Exam: Vitals:   07/11/21 0747 07/11/21 1146  BP: (!) 135/54 (!) 116/57  Pulse: 81 88  Resp: 18 18  Temp: 98.6 F (37 C) 98.6 F (37 C)  SpO2: 90% 97%   Vitals:   07/11/21 0051 07/11/21 0353 07/11/21 0747 07/11/21 1146  BP: (!) 108/44 (!) 127/59 (!) 135/54 (!) 116/57  Pulse: (!) 102 92 81 88  Resp: 18 16 18 18   Temp: 98.9 F (37.2 C) 98.3 F (36.8 C) 98.6 F (37 C) 98.6 F (37 C)  TempSrc:  Oral    SpO2: 92% 95% 90% 97%  Weight:  99.4 kg    Height:        General: Pt is alert, awake, not in acute distress Cardiovascular: S1/S2 +, no rubs, no gallops Respiratory: decreased breath sounds b/l otherwise clear. No wheezes/rales/rhonchi  Abdominal: Soft, NT, ND, bowel sounds + Extremities: no edema, no cyanosis    The results of significant diagnostics from this hospitalization (including imaging, microbiology, ancillary and laboratory) are listed below for reference.     Microbiology: Recent Results (from the past 240 hour(s))  Resp Panel by RT-PCR (Flu A&B, Covid) Nasopharyngeal Swab     Status: None   Collection Time: 07/08/21 11:07 PM   Specimen: Nasopharyngeal Swab; Nasopharyngeal(NP) swabs in vial transport medium  Result Value Ref Range Status   SARS Coronavirus 2 by RT PCR NEGATIVE NEGATIVE Final    Comment: (NOTE) SARS-CoV-2 target nucleic acids are NOT DETECTED.  The SARS-CoV-2 RNA is generally detectable in upper respiratory specimens during the acute phase of infection. The lowest concentration of SARS-CoV-2 viral copies this assay can detect is 138 copies/mL. A negative result  does not preclude SARS-Cov-2 infection and should not be used as the sole basis for treatment or other patient management decisions. A negative result may occur with  improper specimen collection/handling, submission of specimen other than nasopharyngeal swab, presence of viral mutation(s) within the areas targeted by this assay, and inadequate number of viral copies(<138 copies/mL). A negative result must be combined with clinical observations, patient history, and epidemiological information. The expected result is Negative.  Fact Sheet for Patients:  EntrepreneurPulse.com.au  Fact Sheet for Healthcare Providers:  IncredibleEmployment.be  This test is no t yet approved or cleared by the [place] FDA and  has been authorized for detection and/or diagnosis of SARS-CoV-2 by FDA under an Emergency Use Authorization (EUA). This EUA will remain  in effect (meaning this test can be used) for the duration of the COVID-19 declaration under Section 564(b)(1) of the Act, 21 U.S.C.section 360bbb-3(b)(1), unless the authorization is terminated  or revoked sooner.       Influenza A by PCR NEGATIVE NEGATIVE Final   Influenza B by PCR NEGATIVE NEGATIVE Final    Comment: (NOTE) The Xpert Xpress SARS-CoV-2/FLU/RSV plus assay is intended as an aid in the diagnosis of influenza from Nasopharyngeal swab specimens and should not be used as a sole basis for treatment. Nasal washings and aspirates are unacceptable for Xpert Xpress SARS-CoV-2/FLU/RSV testing.  Fact Sheet for Patients: EntrepreneurPulse.com.au  Fact Sheet for Healthcare Providers: IncredibleEmployment.be  This test is not yet approved or cleared by the [place] FDA and has been authorized for detection and/or diagnosis of SARS-CoV-2 by FDA under an Emergency Use Authorization (EUA). This EUA will remain in effect (meaning this test can be used) for  the duration of the COVID-19 declaration under Section 564(b)(1) of the Act, 21 U.S.C. section 360bbb-3(b)(1), unless the authorization is terminated or revoked.  Performed at [facility], [address] [place], [place] [address]      Labs: BNP (last 3 results) Recent Labs    07/16/20 0254 04/20/21 2305 07/08/21 2307  BNP 178.6* 71.3 970.2*   Basic Metabolic Panel: Recent Labs  Lab 07/08/21 2307 07/09/21 0647 07/10/21 0458 07/11/21 0355  NA 138  --  136 135  K  4.1  --  3.5 3.8  CL 100  --  95* 95*  CO2 30  --  30 31  GLUCOSE 150*  --  163* 130*  BUN 31*  --  40* 44*  CREATININE 1.60* 1.37* 2.12* 1.99*  CALCIUM 9.2  --  8.6* 8.6*   Liver Function Tests: Recent Labs  Lab 07/08/21 2307  AST 19  ALT 19  ALKPHOS 48  BILITOT 0.7  PROT 7.6  ALBUMIN 4.2   No results for input(s): LIPASE, AMYLASE in the last 168 hours. No results for input(s): AMMONIA in the last 168 hours. CBC: Recent Labs  Lab 07/08/21 2307 07/09/21 0647 07/10/21 0458 07/11/21 0355  WBC 7.8 7.5 10.6* 8.8  NEUTROABS 6.1  --   --   --   HGB 13.3 12.8* 12.6* 12.3*  HCT 40.3 37.9* 37.1* 36.3*  MCV 99.5 96.7 96.6 95.5  PLT 199 179 188 183   Cardiac Enzymes: No results for input(s): CKTOTAL, CKMB, CKMBINDEX, TROPONINI in the last 168 hours. BNP: Invalid input(s): POCBNP CBG: Recent Labs  Lab 07/10/21 1923  GLUCAP 197*   D-Dimer No results for input(s): DDIMER in the last 72 hours. Hgb A1c No results for input(s): HGBA1C in the last 72 hours. Lipid Profile No results for input(s): CHOL, HDL, LDLCALC, TRIG, CHOLHDL, LDLDIRECT in the last 72 hours. Thyroid function studies No results for input(s): TSH, T4TOTAL, T3FREE, THYROIDAB in the last 72 hours.  Invalid input(s): FREET3 Anemia work up No results for input(s): VITAMINB12, FOLATE, FERRITIN, TIBC, IRON, RETICCTPCT in the last 72 hours. Urinalysis    Component Value Date/Time   COLORURINE yellow 07/02/2008 0832    APPEARANCEUR Clear 07/02/2008 0832   LABSPEC 1.015 07/02/2008 0832   PHURINE 5.5 07/02/2008 0832   HGBUR negative 07/02/2008 0832   BILIRUBINUR negative 08/21/2020 1127   KETONESUR negative 08/21/2020 1127   PROTEINUR negative 08/21/2020 1127   UROBILINOGEN negative (A) 08/21/2020 1127   UROBILINOGEN 0.2 07/02/2008 0832   NITRITE Negative 08/21/2020 1127   NITRITE negative 07/02/2008 0832   LEUKOCYTESUR Negative 08/21/2020 1127   Sepsis Labs Invalid input(s): PROCALCITONIN,  WBC,  LACTICIDVEN Microbiology Recent Results (from the past 240 hour(s))  Resp Panel by RT-PCR (Flu A&B, Covid) Nasopharyngeal Swab     Status: None   Collection Time: 07/08/21 11:07 PM   Specimen: Nasopharyngeal Swab; Nasopharyngeal(NP) swabs in vial transport medium  Result Value Ref Range Status   SARS Coronavirus 2 by RT PCR NEGATIVE NEGATIVE Final    Comment: (NOTE) SARS-CoV-2 target nucleic acids are NOT DETECTED.  The SARS-CoV-2 RNA is generally detectable in upper respiratory specimens during the acute phase of infection. The lowest concentration of SARS-CoV-2 viral copies this assay can detect is 138 copies/mL. A negative result does not preclude SARS-Cov-2 infection and should not be used as the sole basis for treatment or other patient management decisions. A negative result may occur with  improper specimen collection/handling, submission of specimen other than nasopharyngeal swab, presence of viral mutation(s) within the areas targeted by this assay, and inadequate number of viral copies(<138 copies/mL). A negative result must be combined with clinical observations, patient history, and epidemiological information. The expected result is Negative.  Fact Sheet for Patients:  EntrepreneurPulse.com.au  Fact Sheet for Healthcare Providers:  IncredibleEmployment.be  This test is no t yet approved or cleared by the [place] FDA and  has been authorized  for detection and/or diagnosis of SARS-CoV-2 by FDA under an Emergency Use Authorization (EUA). This EUA  will remain  in effect (meaning this test can be used) for the duration of the COVID-19 declaration under Section 564(b)(1) of the Act, 21 U.S.C.section 360bbb-3(b)(1), unless the authorization is terminated  or revoked sooner.       Influenza A by PCR NEGATIVE NEGATIVE Final   Influenza B by PCR NEGATIVE NEGATIVE Final    Comment: (NOTE) The Xpert Xpress SARS-CoV-2/FLU/RSV plus assay is intended as an aid in the diagnosis of influenza from Nasopharyngeal swab specimens and should not be used as a sole basis for treatment. Nasal washings and aspirates are unacceptable for Xpert Xpress SARS-CoV-2/FLU/RSV testing.  Fact Sheet for Patients: EntrepreneurPulse.com.au  Fact Sheet for Healthcare Providers: IncredibleEmployment.be  This test is not yet approved or cleared by the [place] FDA and has been authorized for detection and/or diagnosis of SARS-CoV-2 by FDA under an Emergency Use Authorization (EUA). This EUA will remain in effect (meaning this test can be used) for the duration of the COVID-19 declaration under Section 564(b)(1) of the Act, 21 U.S.C. section 360bbb-3(b)(1), unless the authorization is terminated or revoked.  Performed at [facility], [address], [place], [place] [address]      Time coordinating discharge: Over 30 minutes  SIGNED:   Wyvonnia Dusky, MD  [facility] 07/11/2021, 11:57 AM Pager   If 7PM-7AM, please contact night-coverage

## 2021-07-12 NOTE — Telephone Encounter (Signed)
See other telephone encounter dated 07/07/21 where this was addressed.

## 2021-07-13 ENCOUNTER — Telehealth: Payer: Self-pay | Admitting: Nurse Practitioner

## 2021-07-13 ENCOUNTER — Telehealth: Payer: Self-pay | Admitting: Pulmonary Disease

## 2021-07-13 ENCOUNTER — Telehealth: Payer: Self-pay

## 2021-07-13 NOTE — Telephone Encounter (Signed)
Transition Care Management Follow-up Telephone Call Date of discharge and from where: [facility] on 07/11/21 How have you been since you were released from the hospital? Patient states that he is feeling better. Any questions or concerns? No  Items Reviewed: Did the pt receive and understand the discharge instructions provided? Yes  Medications obtained and verified? Yes  Other? No  Any new allergies since your discharge? No  Dietary orders reviewed? Yes Do you have support at home? Yes   Home Care and Equipment/Supplies: Were home health services ordered? no If so, what is the name of the agency? [name]  Has the agency set up a time to come to the patient's home? not applicable Were any new equipment or medical supplies ordered?  No What is the name of the medical supply agency? [name] Were you able to get the supplies/equipment? not applicable Do you have any questions related to the use of the equipment or supplies? No  Functional Questionnaire: (I = Independent and D = Dependent) ADLs: I  Bathing/Dressing- I  Meal Prep- I, wife helps prepare meals  Eating- I  Maintaining continence- I  Transferring/Ambulation- I with some assistance from walker as needed  Managing Meds- I  Follow up appointments reviewed:  PCP Hospital f/u appt confirmed? Yes  Scheduled to see Dr. Danise Mina on 07/23/21 @ 10:00am. [facility] f/u appt confirmed? Yes  Scheduled to see Dr. Sharolyn Douglas on 07/16/21 @ 2:00pm. Are transportation arrangements needed? No  If their condition worsens, is the pt aware to call PCP or go to the Emergency Dept.? Yes Was the patient provided with contact information for the PCP's office or ED? Yes Was to pt encouraged to call back with questions or concerns? Yes

## 2021-07-13 NOTE — Telephone Encounter (Signed)
Patient is returning phone call. Patient phone number is [phone].

## 2021-07-13 NOTE — Telephone Encounter (Signed)
Patient is aware that duoneb should be used QID.  He voiced his understanding and voiced his understanding.  Nothing further needed.

## 2021-07-13 NOTE — Telephone Encounter (Signed)
Lm for patient.  

## 2021-07-13 NOTE — Telephone Encounter (Signed)
Patient called in with concerns. Lengthy conversation with patient regarding his concerns, symptoms, changes, and medications. Reports weight gain, continued swelling, headaches, decrease in urination, concerns with kidney function, and abdominal swelling.   Weight yesterday morning of 213.2 and last night it was up to 218.2 This morning weight was 216.8 Reports decrease in urinary output and flow. Took furosemide yesterday and output with good output and flow until noon. Then he noticed he was not going as much. He did go twice during the night.  This morning he took furosemide at 09:15 and as of 10:24 no changes.   Inquired about his sodium intake and he states that he has been eating normal and that most foods are seasoned. Discussed how diet can cause fluid retention and weight gain. Reviewed how very important diet changes are to prevent these symptoms. Patient reports wearing the compression socks and encouraged leg elevation. He reported urinary output in hospital was good and his biggest concern was his kidney function.   He then discussed that he was not taking isosorbide mononitrate in the hospital and once he was sent home they told him to resume that 60 mg dose. Reports that headaches are back since he took it this morning. Per previous recommendations instructed him to decrease to 30 mg to see if that will help. He stated that when he was on that dose he tolerated it better.  Patient had multiple concerns with some mixed messages and attempted to address each one.   Instructed him the following: Continue daily morning weights Work on low sodium diet Decrease imdur to 30 mg once daily Will send to provider for review and any further recommendations.

## 2021-07-13 NOTE — Telephone Encounter (Signed)
Pt c/o swelling: STAT is pt has developed SOB within 24 hours  If swelling, where is the swelling located? Stomach BLE   How much weight have you gained and in what time span? See below   Have you gained 3 pounds in a day or 5 pounds in a week? Yes see below   Do you have a log of your daily weights (if so, list)? Morning yesterday 213.2 evening  yesterday 218.2  Are you currently taking a fluid pill? Yes , but not much output   Are you currently SOB? No   Have you traveled recently? No recent hospital visit   Patient advised to call back with weights for fu see also previous call

## 2021-07-14 NOTE — Telephone Encounter (Signed)
Attempted to schedule sooner

## 2021-07-15 DIAGNOSIS — Z8709 Personal history of other diseases of the respiratory system: Secondary | ICD-10-CM | POA: Diagnosis not present

## 2021-07-15 DIAGNOSIS — I509 Heart failure, unspecified: Secondary | ICD-10-CM | POA: Diagnosis not present

## 2021-07-16 ENCOUNTER — Encounter: Payer: Self-pay | Admitting: Nurse Practitioner

## 2021-07-16 ENCOUNTER — Other Ambulatory Visit: Payer: Self-pay | Admitting: Cardiovascular Disease

## 2021-07-16 ENCOUNTER — Other Ambulatory Visit: Payer: Self-pay

## 2021-07-16 ENCOUNTER — Ambulatory Visit (INDEPENDENT_AMBULATORY_CARE_PROVIDER_SITE_OTHER): Payer: PPO | Admitting: Nurse Practitioner

## 2021-07-16 VITALS — BP 104/50 | HR 88 | Ht 72.0 in | Wt 221.4 lb

## 2021-07-16 DIAGNOSIS — E785 Hyperlipidemia, unspecified: Secondary | ICD-10-CM

## 2021-07-16 DIAGNOSIS — N183 Chronic kidney disease, stage 3 unspecified: Secondary | ICD-10-CM

## 2021-07-16 DIAGNOSIS — I10 Essential (primary) hypertension: Secondary | ICD-10-CM

## 2021-07-16 DIAGNOSIS — I2510 Atherosclerotic heart disease of native coronary artery without angina pectoris: Secondary | ICD-10-CM

## 2021-07-16 DIAGNOSIS — I5032 Chronic diastolic (congestive) heart failure: Secondary | ICD-10-CM | POA: Diagnosis not present

## 2021-07-16 MED ORDER — ISOSORBIDE MONONITRATE ER 30 MG PO TB24: 30.0000 mg | ORAL_TABLET | Freq: Every day | ORAL | 3 refills | Status: DC

## 2021-07-16 NOTE — Patient Instructions (Addendum)
Medication Instructions:  Your physician has recommended you make the following change in your medication:   DECREASE Isosorbide mononitrate to 30 mg once a day  *If you need a refill on your cardiac medications before your next appointment, please call your pharmacy*   Lab Work: BMET in one week here in our office.   If you have labs (blood work) drawn today and your tests are completely normal, you will receive your results only by: [place] (if you have MyChart) OR A paper copy in the mail If you have any lab test that is abnormal or we need to change your treatment, we will call you to review the results.   Testing/Procedures: None   Follow-Up: At [facility], you and your health needs are our priority.  As part of our continuing mission to provide you with exceptional heart care, we have created designated Provider Care Teams.  These Care Teams include your primary Cardiologist (physician) and Advanced Practice Providers (APPs -  Physician Assistants and Nurse Practitioners) who all work together to provide you with the care you need, when you need it.   Your next appointment:   Keep upcoming appointment with Dr. Rockey Situ  The format for your next appointment:   In Person  Provider:   Ida Rogue, MD

## 2021-07-16 NOTE — Progress Notes (Signed)
Office Visit    Patient Name: [name] Date of Encounter: 07/16/2021  Primary Care Provider:  Ria Bush, MD Primary Cardiologist:  Ida Rogue, MD  Chief Complaint    80 y/o ? 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 06/2019 Echo (in setting of COVID): EF 35-40%, mild LVH, g1 DD, glob HK; b. 09/2019 Echo: EF 50-55%, Gr1 DD; c. 03/2021 Echo: EF 50-55%, no rwma, mild LVH, GrI DD, nl RV fxn. RVSP 44.11mmHg. Mild-mod dil LA. Triv MR. Mild-mod Ao sclerosis.   Actinic keratosis    Anginal pain ([place])    BENIGN PROSTATIC HYPERTROPHY, WITH URINARY OBSTRUCTION 09/06/2007   CAD s/p CABG    a. 1990 s/p MI-->CABG x 2; b. 2002 s/p BMS to LCX; c. 06/2015 Cath: LM 50ost, LAD 100ost/p, 68m/d, D2 nl, LCX  patent stent, RCA 80p (small), LIMA->LAD nl, VG->D2 nl-->Med Rx; d. 05/2020 MV: fixed apical ant/apical defect. No signif ischemia; e. 04/2021 MV: EF 52%, no ischemia.   Carotid arterial disease ([place])    a. 08/2016 Carotid U/S: <39% bilat.   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 did not tolerate CPAP.   Osteoarthritis    PAD (peripheral artery disease) ([place])    a. 07/2017 LE duplex: RSFA 75-51m, LSFA 75-63m, 50-74d; c. 08/2018 Periph Angio: No signif AoIliac dzs. Mod-sev Ca2+ RSFA w/ diff dzs throughout- 3 vessel runoff. Borderline signif LSFA dzs w/ mod-sev Ca2+ vessels and 3 vessel runoff below the knee-->Med rx.   Pneumonia 07/2014   [facility] hospitalization   Pneumonia due to COVID-19 virus 06/02/2019   Pre-diabetes    PSA, INCREASED 07/09/2008   Spinal stenosis of lumbar region    Past Surgical History:  Procedure Laterality Date   ABDOMINAL AORTOGRAM W/LOWER EXTREMITY [name] 09/12/2018   Procedure: ABDOMINAL AORTOGRAM W/LOWER EXTREMITY;  Surgeon: [name], MD;  Location: [facility];  Service: Cardiovascular;  Laterality: [name];   APPENDECTOMY     rupture   BACK SURGERY     1984 and then [facility]   CARDIAC CATHETERIZATION [name] 07/07/2015   Procedure: Left Heart Cath and Cors/Grafts Angiography;  Surgeon: [name], MD;  Location: [facility];  Service: Cardiovascular;  Laterality: [name];   CATARACT EXTRACTION, BILATERAL     CERVICAL SPINE SURGERY  12/2016   cervical stenosis [name])  COLONOSCOPY  03/2010   HP polyp, diverticulosis, rpt 10 yrs ([name])   CORONARY ARTERY BYPASS GRAFT  1990   3 vessel    CYSTOSCOPY  12/23/10   [facility]   EYE SURGERY     cataract   JOINT REPLACEMENT     KNEE ARTHROSCOPY Right    x2   LAMINOTOMY  1986   L5/S1 lumbar laminotomy for two ruptured discs/fusion   LUMBAR LAMINECTOMY/DECOMPRESSION MICRODISCECTOMY [name] 07/13/2016   Procedure: LUMBAR TWO-THREE, LUMBAR THREE-FOUR, LUMBAR FOUR-FIVE LAMINECTOMY AND FORAMINOTOMY;  Surgeon: [name], MD;  Location: [facility];  Service: Neurosurgery;  Laterality: [name];  LAMINECTOMY AND FORAMINOTOMY L2-L3,  L3-L4,L4-L5   LUMBAR SPINE SURGERY  06/2020   Bilateral redo laminectomy/laminotomy/foraminotomies/medial facetectomy to decompress the bilateral L4 and L5 nerve roots [name])   TOTAL HIP ARTHROPLASTY Bilateral [id number]   TOTAL KNEE ARTHROPLASTY Right 03/06/2017   Procedure: RIGHT TOTAL KNEE ARTHROPLASTY;  Surgeon: Gaynelle Arabian, MD;  Location: [facility];  Service: Orthopedics;  Laterality: Right;    Allergies  Allergies  Allergen Reactions   Vioxx [Rofecoxib] Other (See Comments)    Hemorrhage    Spiriva Respimat [Tiotropium Bromide Monohydrate] Other (See Comments)    Elevated bp   Contrast Media [Iodinated Diagnostic Agents] Itching and Rash    Delayed reaction post abdominal aortagram.    Morphine Nausea Only and Other (See Comments)    Irritability     History of Present Illness    80 year old male with above complex past medical history including CAD status post MI and CABG x2 in 1990.  This was followed by bare-metal stenting to the left circumflex in 2002.  Subsequent catheterization in November 2016, showed 2 of 2 patent grafts with patent circumflex stent.  He has known small vessel and diffuse right coronary artery disease, which has been medically managed with long-acting nitrate therapy.  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  In January 2020, he underwent lower extremity arterial angiography in the setting of ongoing lower extremity pain and abnormal duplex.  This showed diffuse and calcified bilateral SFA disease with three-vessel runoff below the knee bilaterally.  Medical therapy was recommended.  In October 2020, he was admitted with COVID-19.  Echo showed new LV dysfunction with an EF of 35 to 40%.  Follow-up echo in early 2021 showed improvement to 50-55%.  He had a low risk stress test in October 2021, which was performed prior to back surgery.  In  August 2022, he complained of chest tightness.  Echocardiogram showed an EF of 50 to 55% with normal RV function and RVSP of 44.7 mmHg.  Low-dose Lasix was added as needed.  He subsequently underwent stress testing in September 2022 in the setting of admission for unstable angina and dyspnea, and this was low risk without evidence of ischemia.  He dealt with sore throat and upper respiratory symptoms throughout most of October and at his cardiology visit on November 17, he reported exertional chest tightness occurring when he walked to his mailbox.  His isosorbide mononitrate was increased to 60 mg daily however, this resulted in dizziness and he was advised to reduce it back down to 30 mg daily.  Further, he reported bradycardia with rates in the 40s and his metoprolol was discontinued.  Mr. Corron was readmitted on November 24 in the setting of dyspnea and respiratory failure with a saturation of 90% on room air in the emergency department.  His creatinine was 1.6, which is above baseline.  Troponin was normal.  BNP was 507.  He was initially placed on intravenous Lasix and subsequently nebulizers and azithromycin.  With diuresis, creatinine rose and he was seen by our team with recommendation to discontinue.  Ultimately, respiratory failure was felt to be secondary to URI and he was discharged home December 27.  Since discharge, he has been taking Lasix 20 mg daily.  He has had some fluctuation in weight but after contacting our office, his weight is stabilized at 217 pounds, which is similar to his preadmission weight, and lower extremity swelling has improved with application of compression socks.  He has not been having any chest pain and feels as though his dyspnea has improved.  He still feels somewhat weak and is looking forward to having more energy as he continues to recover from recent respiratory illness.  He is now off of antibiotics.  He denies palpitations, PND, orthopnea, dizziness, syncope, or  early satiety.  Home Medications    Current Outpatient Medications  Medication Sig Dispense Refill   acetaminophen (TYLENOL) 500 MG tablet Take 500 mg by mouth every 8 (eight) hours as needed.     aspirin EC 81 MG EC tablet Take 1 tablet (81 mg total) by mouth daily. 30 tablet 0   atorvastatin (LIPITOR) 20 MG tablet TAKE 1 TABLET BY MOUTH DAILY. 90 tablet 0   Cyanocobalamin (B-12) 1000 MCG SUBL Place 1 tablet under the tongue daily. 30 each    diclofenac Sodium (VOLTAREN) 1 % GEL Apply 2 g topically 4 (four) times daily. 50 g 0   docusate sodium (COLACE) 100 MG capsule Take 100 mg by mouth 2 (two) times daily as needed for mild constipation.     ezetimibe (ZETIA) 10 MG tablet TAKE 1 TABLET BY MOUTH ONCE DAILY. 90 tablet 2   fluticasone (FLONASE) 50 MCG/ACT nasal spray Place into both nostrils as needed for allergies or rhinitis.     furosemide (LASIX) 20 MG tablet Take 1 tablet (20 mg total) by mouth daily as needed. Restart taking this medication tomorrow 07/12/21 30 tablet 0   gabapentin (NEURONTIN) 600 MG tablet Take 600 mg by mouth 3 (three) times daily.     guaiFENesin-dextromethorphan (ROBITUSSIN DM) 100-10 MG/5ML syrup Take 5 mLs by mouth every 4 (four) hours as needed for up to 7 days for cough. 118 mL 0   ipratropium-albuterol (DUONEB) 0.5-2.5 (3) MG/3ML SOLN Take 3 mLs by nebulization every 6 (six) hours as needed. 360 mL 11   isosorbide mononitrate (IMDUR) 60 MG 24 hr tablet Take 1 tablet (60 mg total) by mouth daily. 90 tablet 3   losartan (COZAAR) 25 MG tablet Take 1 tablet (25 mg total) by mouth daily. Restart taking this medication tomorrow 07/12/21 90 tablet 3   Nebulizers (COMPRESSOR/NEBULIZER) MISC Use as directed. 1 each 0   nitroGLYCERIN (NITROSTAT) 0.4 MG SL tablet Place 1 tablet (0.4 mg total) under the tongue every 5 (five) minutes as needed for chest pain. 25 tablet 3   omeprazole (PRILOSEC) 20 MG capsule TAKE 1 CAPSULE EVERY DAY 30 capsule 4   OXYGEN Inhale 2 L into  the lungs at bedtime.     polyethylene glycol (MIRALAX / GLYCOLAX) 17 g packet Take 17 g by mouth daily as needed for mild constipation.      PROAIR HFA 108 (90 Base) MCG/ACT inhaler TAKE 2 PUFFS INTO LUNGS EVERY 6 HOURS ASNEEDED FOR WHEEZING OR SHORTNESS OF BREATH 8.5 g  6   ranolazine (RANEXA) 500 MG 12 hr tablet Take 1 tablet (500 mg total) by mouth 2 (two) times daily. 180 tablet 3   tamsulosin (FLOMAX) 0.4 MG CAPS capsule Take 0.4 mg by mouth at bedtime.     tolterodine (DETROL LA) 4 MG 24 hr capsule TAKE 1 CAPSULE EVERY DAY 90 capsule 1   No current facility-administered medications for this visit.     Review of Systems    Has been feeling weak since hospitalization but denies chest pain, dyspnea, palpitations, PND, orthopnea, dizziness, syncope, edema, or early satiety.  All other systems reviewed and are otherwise negative except as noted above.    Physical Exam    VS:  BP (!) 104/50 (BP Location: Left Arm, Patient Position: Sitting, Cuff Size: Normal)   Pulse 88   Ht 6' (1.829 m)   Wt 221 lb 6 oz (100.4 kg)   SpO2 96%   BMI 30.02 kg/m  , BMI Body mass index is 30.02 kg/m.     GEN: Well nourished, well developed, in no acute distress. HEENT: normal. Neck: Supple, no JVD, carotid bruits, or masses. Cardiac: RRR, 2/6 systolic murmur heard throughout, no rubs, or gallops. No clubbing, cyanosis, edema.  Radials/DP/PT 2+ and equal bilaterally.  Respiratory:  Respirations regular and unlabored, clear to auscultation bilaterally. GI: Soft, nontender, nondistended, BS + x 4. MS: no deformity or atrophy. Skin: warm and dry, no rash. Neuro:  Strength and sensation are intact. Psych: Normal affect.  Accessory Clinical Findings    Lab Results  Component Value Date   WBC 8.8 07/11/2021   HGB 12.3 (L) 07/11/2021   HCT 36.3 (L) 07/11/2021   MCV 95.5 07/11/2021   PLT 183 07/11/2021   Lab Results  Component Value Date   CREATININE 1.99 (H) 07/11/2021   BUN 44 (H) 07/11/2021    NA 135 07/11/2021   K 3.8 07/11/2021   CL 95 (L) 07/11/2021   CO2 31 07/11/2021   Lab Results  Component Value Date   ALT 19 07/08/2021   AST 19 07/08/2021   ALKPHOS 48 07/08/2021   BILITOT 0.7 07/08/2021   Lab Results  Component Value Date   CHOL 114 02/16/2021   HDL 33.40 (L) 02/16/2021   LDLCALC 62 02/16/2021   LDLDIRECT 106.0 01/01/2016   TRIG 92.0 02/16/2021   CHOLHDL 3 02/16/2021    Lab Results  Component Value Date   HGBA1C 6.3 02/16/2021    Assessment & Plan    1.  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  Recently hospitalized with respiratory failure in the setting of upper respiratory infection.  He was initially diuresed but this resulted in elevations in BUN and creatinine and Lasix required holding.  He is now taking Lasix 20 mg daily though directions at discharge say Lasix 20 mg daily as needed, which for him historically, has been every other day.Marland Kitchen  His edema has improved after applying compression socks and he is euvolemic on examination today.  I encouraged him to take Lasix every other day if needed, as he was doing prior to admission.  I offered to follow-up a basic metabolic panel today however, he prefers to come back next week and we will arrange for this.  Heart rate and blood pressure are stable.  Continue ARB.  With soft blood pressure and recent increase in creatinine, will hold off on introducing empagliflozin at this time.  2.  Stable  angina/coronary artery disease: Status post remote CABG x2 with 2 of 2 patent grafts on catheterization 2016 and small vessel right coronary artery disease (ungrafted).  He had a low risk stress test in September of this year.  In the setting of ongoing angina at his last visit, I increased his isosorbide to 60 mg daily however, this resulted in dizziness and we scaled this back to 30 mg daily, which he is currently tolerating.  He has not had any chest  pain since hospital discharge.  Remains on aspirin, statin, Zetia, nitrate, and Ranexa.  Metoprolol previously discontinued secondary to low heart rates at home.  3.  Essential hypertension: Blood pressure has been soft though he seems to be feeling better since we reduced the isosorbide and stop metoprolol.  4.  Hyperlipidemia: Remains on statin and Zetia therapy.  LDL of 62 in July with normal LFTs last week.  5.  Chronic pain: No complaints today.  On gabapentin.  6.  Nocturnal hypoxia: Pending sleep study-seeing pulmonology.  7.  Stage III chronic kidney disease: Creatinine 1.99 at discharge in November 27.  Patient will come back next week for follow-up basic metabolic panel.  8.  Disposition: Follow-up basic metabolic panel next week.  He has follow-up with Dr. Rockey Situ on the 19th.  Murray Hodgkins, NP 07/16/2021, 2:45 PM

## 2021-07-19 ENCOUNTER — Ambulatory Visit: Payer: PPO | Admitting: Family

## 2021-07-21 ENCOUNTER — Ambulatory Visit: Payer: PPO | Admitting: Dermatology

## 2021-07-21 ENCOUNTER — Other Ambulatory Visit
Admission: RE | Admit: 2021-07-21 | Discharge: 2021-07-21 | Disposition: A | Discharge status: Home / Self Care | Payer: PPO | Attending: Nurse Practitioner | Admitting: Nurse Practitioner

## 2021-07-21 ENCOUNTER — Other Ambulatory Visit: Payer: Self-pay

## 2021-07-21 DIAGNOSIS — I2510 Atherosclerotic heart disease of native coronary artery without angina pectoris: Secondary | ICD-10-CM | POA: Diagnosis not present

## 2021-07-21 DIAGNOSIS — I739 Peripheral vascular disease, unspecified: Secondary | ICD-10-CM | POA: Insufficient documentation

## 2021-07-21 DIAGNOSIS — H6522 Chronic serous otitis media, left ear: Secondary | ICD-10-CM | POA: Diagnosis not present

## 2021-07-21 DIAGNOSIS — H90A12 Conductive hearing loss, unilateral, left ear with restricted hearing on the contralateral side: Secondary | ICD-10-CM | POA: Diagnosis not present

## 2021-07-21 LAB — BASIC METABOLIC PANEL
Anion gap: 7 (ref 5–15)
BUN: 27 mg/dL — ABNORMAL HIGH (ref 8–23)
CO2: 24 mmol/L (ref 22–32)
Calcium: 8.6 mg/dL — ABNORMAL LOW (ref 8.9–10.3)
Chloride: 104 mmol/L (ref 98–111)
Creatinine, Ser: 1.52 mg/dL — ABNORMAL HIGH (ref 0.61–1.24)
GFR, Estimated: 46 mL/min — ABNORMAL LOW (ref >60)
Glucose, Bld: 111 mg/dL — ABNORMAL HIGH (ref 70–99)
Potassium: 4.5 mmol/L (ref 3.5–5.1)
Sodium: 135 mmol/L (ref 135–145)

## 2021-07-21 NOTE — Progress Notes (Signed)
Changed to [place] per pt's request

## 2021-07-22 ENCOUNTER — Other Ambulatory Visit: Payer: PPO

## 2021-07-23 ENCOUNTER — Encounter: Payer: Self-pay | Admitting: Family Medicine

## 2021-07-23 ENCOUNTER — Other Ambulatory Visit: Payer: Self-pay

## 2021-07-23 ENCOUNTER — Ambulatory Visit (INDEPENDENT_AMBULATORY_CARE_PROVIDER_SITE_OTHER): Payer: PPO | Admitting: Family Medicine

## 2021-07-23 VITALS — BP 140/70 | HR 78 | Temp 97.5°F | Ht 72.0 in | Wt 216.0 lb

## 2021-07-23 DIAGNOSIS — N179 Acute kidney failure, unspecified: Secondary | ICD-10-CM

## 2021-07-23 DIAGNOSIS — J439 Emphysema, unspecified: Secondary | ICD-10-CM | POA: Diagnosis not present

## 2021-07-23 DIAGNOSIS — I43 Cardiomyopathy in diseases classified elsewhere: Secondary | ICD-10-CM | POA: Diagnosis not present

## 2021-07-23 DIAGNOSIS — M5116 Intervertebral disc disorders with radiculopathy, lumbar region: Secondary | ICD-10-CM | POA: Diagnosis not present

## 2021-07-23 DIAGNOSIS — N1831 Chronic kidney disease, stage 3a: Secondary | ICD-10-CM

## 2021-07-23 DIAGNOSIS — I10 Essential (primary) hypertension: Secondary | ICD-10-CM | POA: Diagnosis not present

## 2021-07-23 DIAGNOSIS — U071 COVID-19: Secondary | ICD-10-CM

## 2021-07-23 DIAGNOSIS — R7303 Prediabetes: Secondary | ICD-10-CM

## 2021-07-23 DIAGNOSIS — I5033 Acute on chronic diastolic (congestive) heart failure: Secondary | ICD-10-CM

## 2021-07-23 LAB — POCT GLYCOSYLATED HEMOGLOBIN (HGB A1C): Hemoglobin A1C: 6.6 % — AB (ref 4.0–5.6)

## 2021-07-23 NOTE — Assessment & Plan Note (Signed)
Update A1c per pt request.

## 2021-07-23 NOTE — Assessment & Plan Note (Addendum)
S/p diuresis in the hospital. Overall stable period since home with controlled weights on lasix QOD dosing - continue. Appreciate cardiology care.  Regularly using compression stockings.

## 2021-07-23 NOTE — Assessment & Plan Note (Addendum)
Regularly sees neurosurgery [name]), recently on higher gabapentin dose 600mg  TID with benefit.

## 2021-07-23 NOTE — Assessment & Plan Note (Signed)
Presented with CHF exacerbation, s/p diuresis but with worsening kidney function, then improved on discharge.

## 2021-07-23 NOTE — Assessment & Plan Note (Signed)
Chronic, mildly up today however he hasn't taken BP meds yet today. No changes made.

## 2021-07-23 NOTE — Progress Notes (Signed)
Patient ID: [name], male    DOB: [birth year], 80 y.o.   MRN: [id number]  This visit was conducted in person.  BP 140/70   Pulse 78   Temp (!) 97.5 F (36.4 C) (Temporal)   Ht 6' (1.829 m)   Wt 216 lb (98 kg) Comment: Declined.  Per pt, this was weight at home this AM.  SpO2 98%   BMI 29.29 kg/m    CC: hosp f/u visit  Subjective:   HPI: [name] is a 80 y.o. male presenting on 07/23/2021 for Hospitalization Follow-up (Admitted on 07/08/21 at [facility], dx acute chronic CHF; SOB. )   Recent hospitalization for acute respiratory failure in setting of upper respiratory infection and HFpEF (EF 50-55%). Treated with IV diuresis then azithromycin.  Hospital records reviewed. Med rec performed.   Using nebulizer machine QID PRN.   Hasn't taking BP meds this morning yet.  Checking daily weights at home - 213-216lbs.  Home BP readings - 120-130s/60s.  Following low salt diet.  Latest Cr 1.5 (eGFR 46).   L ear hearing loss for 3 months -saw ENT who placed ear tube.  NSG increased gabapentin to 68m TID with benefit.   Home health not needed.  Other follow up appointments scheduled: saw cardiology last week, note reviewed - rec continue lasix 293mQOD and compression stockings. Continues isosorbide 3025maily (higher dose caused headache). Continued aspirin, statin, zetia, and ranexa. Off BB due to bradycardia. Seeing cards in 2 wks for follow up Pending sleep study through pulmonology. Upcoming appt 09/2021. Trial Breztri wasn't helpful.  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 GolRockey Situn 1-2 weeks    Home Health: no  Equipment/Devices: continue w/ home use of oxygen at night only       Relevant past medical, surgical, family and social history reviewed and updated as indicated. Interim medical history since our last visit reviewed. Allergies and medications reviewed and updated. Outpatient Medications Prior to Visit  Medication Sig Dispense Refill   acetaminophen (TYLENOL) 500 MG tablet Take 500 mg by mouth every 8 (eight) hours as needed.     aspirin EC 81 MG EC tablet Take 1 tablet (81 mg total) by mouth daily. 30 tablet 0   atorvastatin (LIPITOR) 20 MG tablet TAKE 1 TABLET BY MOUTH DAILY. 90 tablet 0   Cyanocobalamin (B-12) 1000 MCG SUBL Place 1 tablet under the tongue daily. 30 each    diclofenac Sodium (VOLTAREN) 1 % GEL Apply 2 g topically 4 (four) times daily. 50 g 0   docusate sodium (COLACE) 100 MG capsule Take 100 mg by mouth 2 (two) times daily as needed for mild constipation.     ezetimibe (ZETIA) 10 MG tablet TAKE 1 TABLET BY MOUTH ONCE DAILY. 90 tablet 2   fluticasone (FLONASE) 50 MCG/ACT nasal spray  Place into both nostrils as needed for allergies or rhinitis.     furosemide (LASIX) 20 MG tablet Take 1 tablet (20 mg total) by mouth daily as needed. Restart taking this medication tomorrow 07/12/21 30 tablet 0   gabapentin (NEURONTIN) 600 MG tablet Take 600 mg by mouth 3 (three) times daily.     ipratropium-albuterol (DUONEB) 0.5-2.5 (3) MG/3ML SOLN Take 3 mLs by nebulization every 6 (six) hours as needed. 360 mL 11   isosorbide mononitrate (IMDUR) 30 MG 24 hr tablet Take 1 tablet (30 mg total) by mouth daily. 90  tablet 3   losartan (COZAAR) 25 MG tablet Take 1 tablet (25 mg total) by mouth daily. Restart taking this medication tomorrow 07/12/21 90 tablet 3   Nebulizers (COMPRESSOR/NEBULIZER) MISC Use as directed. 1 each 0   nitroGLYCERIN (NITROSTAT) 0.4 MG SL tablet Place 1 tablet (0.4 mg total) under the tongue every 5 (five) minutes as needed for chest pain. 25 tablet 3   omeprazole (PRILOSEC) 20 MG capsule TAKE 1 CAPSULE EVERY DAY 30 capsule 4   OXYGEN Inhale 2 L into the lungs at bedtime.     polyethylene glycol (MIRALAX / GLYCOLAX) 17 g packet Take 17 g by mouth daily as needed for mild constipation.      PROAIR HFA 108 (90 Base) MCG/ACT inhaler TAKE 2 PUFFS INTO LUNGS EVERY 6 HOURS ASNEEDED FOR WHEEZING OR SHORTNESS OF BREATH 8.5 g 6   ranolazine (RANEXA) 500 MG 12 hr tablet Take 1 tablet (500 mg total) by mouth 2 (two) times daily. 180 tablet 3   tamsulosin (FLOMAX) 0.4 MG CAPS capsule Take 0.4 mg by mouth at bedtime.     tolterodine (DETROL LA) 4 MG 24 hr capsule TAKE 1 CAPSULE EVERY DAY 90 capsule 1   No facility-administered medications prior to visit.     Per HPI unless specifically indicated in ROS section below Review of Systems  Objective:  BP 140/70   Pulse 78   Temp (!) 97.5 F (36.4 C) (Temporal)   Ht 6' (1.829 m)   Wt 216 lb (98 kg) Comment: Declined.  Per pt, this was weight at home this AM.  SpO2 98%   BMI 29.29 kg/m   Wt Readings from Last 3 Encounters:  07/23/21 216 lb (98 kg)  07/16/21 221 lb 6 oz (100.4 kg)  07/11/21 219 lb 2.2 oz (99.4 kg)      Physical Exam Vitals and nursing note reviewed.  Constitutional:      Appearance: Normal appearance. He is not ill-appearing.  HENT:     Right Ear: Hearing, tympanic membrane, ear canal and external ear normal.     Left Ear: Hearing, ear canal and external ear normal. A PE tube is present.  Eyes:     Extraocular Movements: Extraocular movements intact.     Pupils: Pupils are equal, round, and reactive to light.   Cardiovascular:     Rate and Rhythm: Normal rate and regular rhythm.     Pulses: Normal pulses.     Heart sounds: Normal heart sounds. No murmur heard. Pulmonary:     Effort: Pulmonary effort is normal. No respiratory distress.     Breath sounds: No wheezing, rhonchi or rales.     Comments: LLL crackles Musculoskeletal:     Right lower leg: No edema.     Left lower leg: No edema.     Comments: Wearing compression stockings  Skin:    General: Skin is warm and dry.  Findings: No rash.  Neurological:     Mental Status: He is alert.      Lab Results  Component Value Date   CREATININE 1.52 (H) 07/21/2021   BUN 27 (H) 07/21/2021   NA 135 07/21/2021   K 4.5 07/21/2021   CL 104 07/21/2021   CO2 24 07/21/2021    Lab Results  Component Value Date   HGBA1C 6.6 (A) 07/23/2021   Assessment & Plan:  This visit occurred during the SARS-CoV-2 public health emergency.  Safety protocols were in place, including screening questions prior to the visit, additional usage of staff PPE, and extensive cleaning of exam room while observing appropriate contact time as indicated for disinfecting solutions.   Problem List Items Addressed This Visit     Essential hypertension    Chronic, mildly up today however he hasn't taken BP meds yet today. No changes made.       Lumbar disc disease with radiculopathy    Regularly sees neurosurgery [name]), recently on higher gabapentin dose 611m TID with benefit.       Prediabetes    Update A1c per pt request.       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 [facility]N [facility]b .       CKD (chronic kidney disease) stage 3, GFR 30-59 ml/min (HCC)    Continue to avoid NSAIDS, closely monitor hydration status.       Acute on chronic  diastolic CHF (congestive heart failure) ([place]    S/p diuresis in the hospital. Overall stable period since home with controlled weights on lasix QOD dosing - continue. Appreciate cardiology care.  Regularly using compression stockings.       Acute-on-chronic kidney injury ([place]    Presented with CHF exacerbation, s/p diuresis but with worsening kidney function, then improved on discharge.         No orders of the defined types were placed in this encounter.  Orders Placed This Encounter  Procedures   POCT glycosylated hemoglobin (Hb A1C)      Patient Instructions  Good to see you today Continue current medicines.  Continue daily weights, limiting salt.  Return as needed or after 02/23/2022 for physical/wellness visit.  POC A1c today.   Follow up plan: Return in about 7 months (around 02/24/2022) for annual exam, prior fasting for blood work, medicare wellness visit.  JRia Bush MD

## 2021-07-23 NOTE — Assessment & Plan Note (Signed)
Appreciate pulm care pending sleep study. Did not respond to [facility] trial, now only on PRN duoneb .

## 2021-07-23 NOTE — Patient Instructions (Addendum)
Good to see you today Continue current medicines.  Continue daily weights, limiting salt.  Return as needed or after 02/23/2022 for physical/wellness visit.  POC A1c today.

## 2021-07-23 NOTE — Assessment & Plan Note (Signed)
Continue to avoid NSAIDS, closely monitor hydration status.

## 2021-07-26 ENCOUNTER — Telehealth: Payer: Self-pay | Admitting: Cardiovascular Disease

## 2021-07-26 NOTE — Telephone Encounter (Signed)
Spoke with patient and he reports heart rates are elevated when he is walking. Reports they are from 120-130's during activities but when he sits down heart rates go back to 70-80's. Reviewed that heart rates can fluctuate with physical activity and that it is encouraging that it goes back to baseline when resting after that activity. He states that he can feel it when it is going that fast and feels it is beating hard. He denies any other symptoms associated with those rate elevations. Reviewed that I would send this to provider for his review and recommendations. He was appreciative for the call with no other questions at this time.

## 2021-07-26 NOTE — Telephone Encounter (Signed)
Thank you for the udpate. I agree with your recommendations.  His BP has been trending lower, so he should be sure to change positions slowly and carefully.  F/u with Dr. Rockey Situ as planned.

## 2021-07-26 NOTE — Telephone Encounter (Signed)
STAT if HR is under 50 or over 120 (normal HR is 60-100 beats per minute)  What is your heart rate? When walking it gets to 120 - has gotten to 130 a time or two these past few days   Do you have a log of your heart rate readings (document readings)? no  Do you have any other symptoms? No - he can feel it beating faster than normal

## 2021-07-27 NOTE — Telephone Encounter (Signed)
Reviewed provider recommendations with patient and he verbalized understanding and will continue to monitor.   He reports yesterday having a black out spell that only lasted 20 seconds everything went black. He did lean on wall and then it went away. No loss of consciousness. Instructed that if this occurs again to let us know. If it should happen again and he does lose consciousness then he should proceed to the emergency department. He stated his wife has had mini strokes and wondered if that could be the cause. He verbalized understanding to call if it should happen again and/or go to ED if loss of consciousness. Encouraged that he review this at his appointment on Monday with Dr. Rockey Situ. He confirmed that appointment and verbalized understanding of further instructions and ED precautions. He was appreciative for the call back with no further concerns.

## 2021-07-28 NOTE — Telephone Encounter (Signed)
Thank you for the update.  I agree with you recommendations.  His BP has been running lower recently and so he should change positions slowly and carefully.  He may need a heart monitor, but should follow-up with Dr. Rockey Situ as planned to reassess.

## 2021-08-01 NOTE — Progress Notes (Signed)
Cardiology Office Note  Date:  08/02/2021   ID:  [name], DOB 09-19-1940, MRN [id number]  PCP:  Ria Bush, MD   Chief Complaint  Patient presents with   1 month follow up     Patient c/o shortness of breath with walking, syncope and rapid heart beats. Medications reviewed by the patient verbally.    HPI:  80 year old gentleman with hx of OSA, did not tolerate CPAP mild  bilateral carotid arterial disease  <39% bilaterally 08/2016 hyperlipidemia  coronary artery disease,  bypass in 1990,  stenting to the circumflex in 2002,  Last cath 06/2015, patent grafts x 2, RCA small vessel diffuse disease DVT in the left lower extremity,  Completed back surgery for spinal stenosis, 06/20/2016 Cervical neck surgery 01/2017, [name]  total knee surgery in 02/2017 covid 05/20/2019 EF was 35 to 40% in 06/2019 Repeat echo February 2021  ejection fraction 50 to 55% who presents for routine followup  Of his coronary artery disease, post  covid  On prior office visits with fatigue, chest pain, shortness of breath No regular exercise program  Last seen in clinic August 2022 Echo 8/22  1. Left ventricular ejection fraction, by estimation, is 50 to 55%. The  left ventricle has low normal function. The left ventricle has no regional  wall motion abnormalities. There is mild left ventricular hypertrophy.  Left ventricular diastolic  parameters are consistent with Grade I diastolic dysfunction (impaired  relaxation).   2. Right ventricular systolic function is normal. The right ventricular  size is normal. There is mildly elevated pulmonary artery systolic  pressure. The estimated right ventricular systolic pressure is 34.2 mmHg.   3. Left atrial size was mild to moderately dilated.   4. The mitral valve is normal in structure. Trivial mitral valve  regurgitation. No evidence of mitral stenosis.   5. The aortic valve is normal in structure. Aortic valve regurgitation is   not visualized. Mild to moderate aortic valve sclerosis/calcification is  present, without any evidence of aortic stenosis.   Stress test 04/2021, results discussed Pharmacological myocardial perfusion imaging study with no significant  ischemia Normal wall motion, EF estimated at 52% No EKG changes concerning for ischemia at peak stress or in recovery. Low risk scan  Reports currently taking Lasix QOD Creatinine stable 1.5  Other recent events, Near syncope spell while standing talking to wife Chronic mild SOB with walking Wears compression hose for leg swelling  Discussed recent hospitalization In hospital 06/2021: bronchitis Now on nebs, completed prednisone Tubes in ears  Sleep study scheduled this week by pulmonary Drops oxygen at night, now on oxygen  Having good days and bad days, some days feels weak, tired  Labs reviewed Cr 1.5 A1C 6.6 Total chol 114  EKG personally reviewed by myself on todays visit Nsr rate 90 bpm, lafb  Other past medical hx Stress test 10/21, reviewed Low risk, probably normal pharmacologic myocardial perfusion stress test.  LE arterial hx, reviewed today 2.  Right lower extremity: Moderately to severely calcified SFA with diffuse disease throughout its course especially in the midsegment and three-vessel runoff below the knee. 3.  Left lower extremity: Borderline significant SFA disease which is moderately to severely calcified and three-vessel runoff below the knee.  pulmonary appointment sept, 2021  postinflammatory pulmonary issues post Covid COPD poorly compensated Changes to  his inhalers made  CT chest 06/2019 Diffuse multifocal areas of consolidation and ground-glass opacity throughout the lungs compatible with atypical infection in the setting of  known COVID positivity.  Stress test 03/2019 Pharmacological myocardial perfusion imaging study with no significant ischemia Small region mild fixed perfusion defect in the distal  anteroseptal and apical region, unable to exclude old scar vs attenuation artifact EF estimated at 38%, septal wall hypokinesis, possibly secondary to CABG/post-operative state.  No EKG changes concerning for ischemia at peak stress or in recovery. Low risk scan  05/2019 COVID , and hospital 10 days treated with remdesivir and steroids, antibodies little bit of leg swelling but he attributed to not taking diuretics   Back in the hospital again for an additional 10-day stay October 28  he started to run another fever up to 101.3 has not had any chest pain or wheezing no headaches. blood pressure has been  running low in the 90s so he stopped using his lisinopril.  His blood pressure after that improved to 130s over 60s  CT chest,  . Diffuse multifocal areas of consolidation and ground-glass opacity throughout the lungs compatible with atypical infection in the setting of known COVID positivity.  LE arterial 08/2018,   Right lower extremity: Moderately to severely calcified SFA with diffuse disease throughout its course especially in the midsegment and three-vessel runoff below the knee.   AAA less than 3 cm, on scan in 2018 reviewed with him Numerous previous episode of chest pain relieved with belching consistent with GI etiology.  Previous cardiac catheterization 07/07/2015            Ost LM to LM lesion, 50% stenosed.            Ost RCA lesion, 80% stenosed.            Prox RCA-1 lesion, 80% stenosed.            Prox RCA-2 lesion, 80% stenosed.            LIMA was injected is moderate in size, and is anatomically normal.            Dist LAD lesion, 60% stenosed.            Ost LAD to Prox LAD lesion, 100% stenosed.            Mid LAD lesion, 60% stenosed.            The left ventricular systolic function is normal.            SVG was injected is normal in caliber, and is anatomically normal.            2nd Diag lesion, 50% stenosed.    severe RCA disease (small  vessel) not amenable to PCI, moderate left main disease, moderate LAD and proximal diagonal disease. 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  PSH:    Past Surgical History:  Procedure Laterality Date   ABDOMINAL AORTOGRAM W/LOWER EXTREMITY [name] 09/12/2018   Procedure: ABDOMINAL AORTOGRAM W/LOWER EXTREMITY;  Surgeon: [name], MD;  Location: [facility];  Service: Cardiovascular;  Laterality: [name];   APPENDECTOMY     rupture   BACK SURGERY     1984 and then [facility]   CARDIAC CATHETERIZATION [name] 07/07/2015   Procedure: Left Heart Cath and Cors/Grafts Angiography;  Surgeon: [name], MD;  Location: [facility];  Service: Cardiovascular;  Laterality: [name];   CATARACT EXTRACTION, BILATERAL     CERVICAL SPINE SURGERY  12/2016   cervical stenosis [name])   COLONOSCOPY  03/2010   HP  polyp, diverticulosis, rpt 10 yrs ([name])   CORONARY ARTERY BYPASS GRAFT  1990   3 vessel    CYSTOSCOPY  12/23/10   [facility]   EYE SURGERY     cataract   JOINT REPLACEMENT     KNEE ARTHROSCOPY Right    x2   LAMINOTOMY  1986   L5/S1 lumbar laminotomy for two ruptured discs/fusion   LUMBAR LAMINECTOMY/DECOMPRESSION MICRODISCECTOMY [name] 07/13/2016   Procedure: LUMBAR TWO-THREE, LUMBAR THREE-FOUR, LUMBAR FOUR-FIVE LAMINECTOMY AND FORAMINOTOMY;  Surgeon: [name], MD;  Location: [facility];  Service: Neurosurgery;  Laterality: [name];  LAMINECTOMY AND FORAMINOTOMY L2-L3, L3-L4,L4-L5   LUMBAR SPINE SURGERY  06/2020   Bilateral redo laminectomy/laminotomy/foraminotomies/medial facetectomy to decompress the bilateral L4 and L5 nerve roots [name])   TOTAL HIP ARTHROPLASTY Bilateral [id number]   TOTAL KNEE ARTHROPLASTY Right 03/06/2017   Procedure: RIGHT TOTAL KNEE ARTHROPLASTY;  Surgeon: Gaynelle Arabian, MD;  Location: [facility];  Service: Orthopedics;  Laterality: Right;    Current Outpatient Medications  Medication Sig Dispense Refill   acetaminophen (TYLENOL) 500 MG tablet Take 500 mg by mouth every 8 (eight) hours as needed.     aspirin EC 81 MG EC tablet Take 1 tablet (81 mg total) by mouth daily. 30 tablet 0   atorvastatin (LIPITOR) 20 MG tablet TAKE 1 TABLET BY MOUTH DAILY. 90 tablet 0   Cyanocobalamin (B-12) 1000 MCG SUBL Place 1 tablet under the tongue daily. 30 each    diclofenac Sodium (VOLTAREN) 1 % GEL Apply 2 g topically 4 (four) times daily. 50 g 0   docusate sodium (COLACE) 100 MG capsule Take 100 mg by mouth 2 (two) times daily as needed for mild constipation.     ezetimibe (ZETIA) 10 MG tablet TAKE 1 TABLET BY MOUTH ONCE DAILY. 90 tablet 2   fluticasone (FLONASE) 50 MCG/ACT nasal spray Place into both nostrils as needed for allergies or rhinitis.     furosemide (LASIX) 20 MG tablet Take 1 tablet (20 mg total) by mouth daily as needed. Restart taking this medication tomorrow 07/12/21 30  tablet 0   gabapentin (NEURONTIN) 600 MG tablet Take 600 mg by mouth 3 (three) times daily.     ipratropium-albuterol (DUONEB) 0.5-2.5 (3) MG/3ML SOLN Take 3 mLs by nebulization every 6 (six) hours as needed. 360 mL 11   isosorbide mononitrate (IMDUR) 30 MG 24 hr tablet Take 1 tablet (30 mg total) by mouth daily. 90 tablet 3   losartan (COZAAR) 25 MG tablet Take 1 tablet (25 mg total) by mouth daily. Restart taking this medication tomorrow 07/12/21 90 tablet 3   Nebulizers (COMPRESSOR/NEBULIZER) MISC Use as directed. 1 each 0   nitroGLYCERIN (NITROSTAT) 0.4 MG SL tablet Place  1 tablet (0.4 mg total) under the tongue every 5 (five) minutes as needed for chest pain. 25 tablet 3   OXYGEN Inhale 2 L into the lungs at bedtime.     pantoprazole (PROTONIX) 40 MG tablet Take 40 mg by mouth daily.     polyethylene glycol (MIRALAX / GLYCOLAX) 17 g packet Take 17 g by mouth daily as needed for mild constipation.      PROAIR HFA 108 (90 Base) MCG/ACT inhaler TAKE 2 PUFFS INTO LUNGS EVERY 6 HOURS ASNEEDED FOR WHEEZING OR SHORTNESS OF BREATH 8.5 g 6   ranolazine (RANEXA) 500 MG 12 hr tablet Take 1 tablet (500 mg total) by mouth 2 (two) times daily. 180 tablet 3   tamsulosin (FLOMAX) 0.4 MG CAPS capsule Take 0.4 mg by mouth at bedtime.     tolterodine (DETROL LA) 4 MG 24 hr capsule TAKE 1 CAPSULE EVERY DAY 90 capsule 1   No current facility-administered medications for this visit.     Allergies:   Vioxx [rofecoxib], Spiriva respimat [tiotropium bromide monohydrate], Contrast media [iodinated diagnostic agents], and Morphine   Social History:  The patient  reports that he quit smoking about 32 years ago. His smoking use included cigarettes. He has a 25.00 pack-year smoking history. He has never used smokeless tobacco. He reports that he does not drink alcohol and does not use drugs.   Family History:   family history includes Cancer in his sister and another family member; Diabetes in his brother and mother;  Heart attack in his mother; Heart disease in his father; Kidney disease in his father and sister; Stroke in his father and mother.   Review of Systems: Review of Systems  Constitutional:  Positive for malaise/fatigue.  HENT: Negative.    Respiratory: Negative.    Cardiovascular: Negative.   Gastrointestinal: Negative.   Musculoskeletal: Negative.   Neurological: Negative.   Psychiatric/Behavioral: Negative.    All other systems reviewed and are negative.  PHYSICAL EXAM: VS:  BP 130/62 (BP Location: Left Arm, Patient Position: Sitting, Cuff Size: Normal)    Pulse 90    Ht 6' (1.829 m)    Wt 221 lb 6 oz (100.4 kg)    SpO2 97%    BMI 30.02 kg/m  , BMI Body mass index is 30.02 kg/m. Constitutional:  oriented to person, place, and time. No distress.  HENT:  Head: Grossly normal Eyes:  no discharge. No scleral icterus.  Neck: No JVD, no carotid bruits  Cardiovascular: Regular rate and rhythm, no murmurs appreciated Pulmonary/Chest: Clear to auscultation bilaterally, no wheezes or rails Abdominal: Soft.  no distension.  no tenderness.  Musculoskeletal: Normal range of motion Neurological:  normal muscle tone. Coordination normal. No atrophy Skin: Skin warm and dry Psychiatric: normal affect, pleasant   Recent Labs: 02/16/2021: TSH 2.77 07/08/2021: ALT 19; B Natriuretic Peptide 507.2 07/11/2021: Hemoglobin 12.3; Platelets 183 07/21/2021: BUN 27; Creatinine, Ser 1.52; Potassium 4.5; Sodium 135    Lipid Panel Lab Results  Component Value Date   CHOL 114 02/16/2021   HDL 33.40 (L) 02/16/2021   LDLCALC 62 02/16/2021   TRIG 92.0 02/16/2021      Wt Readings from Last 3 Encounters:  08/02/21 221 lb 6 oz (100.4 kg)  07/23/21 216 lb (98 kg)  07/16/21 221 lb 6 oz (100.4 kg)     ASSESSMENT AND PLAN:  Cardiomyopathy, post covid, recent bronchitis Recovered, EF up to 50 -55% by echo 09/2019, same in 2022, no change On lasix QOD Losartan  and imdur B-blocker previously held, fatigue  side effects, COPD  Cad with stable angina Currently with no symptoms of angina. No further workup at this time. Continue current medication regimen.  Covid , 05/2019 Chronic shortness of breath recovered relatively well, Recent bronchitis, hospitalized  COPD/emphysema Prior smoking history, Covid 2020 Getting over bronchitis, working with pulmonary On inhalers Stable Needs walking program  Mixed hyperlipidemia Cholesterol is at goal on the current lipid regimen. No changes to the medications were made.  Essential hypertension Blood pressure is well controlled on today's visit. No changes made to the medications.  Bilateral carotid artery stenosis Mild bilateral carotid disease less than 39% bilaterally Continue aggressive lipid management  Diet-controlled diabetes mellitus ([place]) Higher with prednisone  PAD Follows with Dr. [name] ,  bilateral SFA disease No claudications    Total encounter time more than 25 minutes  Greater than 50% was spent in counseling and coordination of care with the patient   No orders of the defined types were placed in this encounter.     Signed, Esmond Plants, M.D., Ph.D. 08/02/2021  [place], [place]

## 2021-08-02 ENCOUNTER — Other Ambulatory Visit: Payer: Self-pay | Admitting: Cardiovascular Disease

## 2021-08-02 ENCOUNTER — Other Ambulatory Visit: Payer: Self-pay

## 2021-08-02 ENCOUNTER — Other Ambulatory Visit
Admission: RE | Admit: 2021-08-02 | Discharge: 2021-08-02 | Disposition: A | Discharge status: Home / Self Care | Payer: PPO | Source: Ambulatory Visit | Attending: Pulmonary Disease | Admitting: Pulmonary Disease

## 2021-08-02 ENCOUNTER — Encounter: Payer: Self-pay | Admitting: Cardiovascular Disease

## 2021-08-02 ENCOUNTER — Ambulatory Visit (INDEPENDENT_AMBULATORY_CARE_PROVIDER_SITE_OTHER): Payer: PPO | Admitting: Cardiovascular Disease

## 2021-08-02 VITALS — BP 130/62 | HR 90 | Ht 72.0 in | Wt 221.4 lb

## 2021-08-02 DIAGNOSIS — I255 Ischemic cardiomyopathy: Secondary | ICD-10-CM

## 2021-08-02 DIAGNOSIS — E785 Hyperlipidemia, unspecified: Secondary | ICD-10-CM | POA: Diagnosis not present

## 2021-08-02 DIAGNOSIS — I5032 Chronic diastolic (congestive) heart failure: Secondary | ICD-10-CM

## 2021-08-02 DIAGNOSIS — I10 Essential (primary) hypertension: Secondary | ICD-10-CM

## 2021-08-02 DIAGNOSIS — Z20822 Contact with and (suspected) exposure to covid-19: Secondary | ICD-10-CM | POA: Diagnosis not present

## 2021-08-02 DIAGNOSIS — I6523 Occlusion and stenosis of bilateral carotid arteries: Secondary | ICD-10-CM | POA: Diagnosis not present

## 2021-08-02 DIAGNOSIS — I739 Peripheral vascular disease, unspecified: Secondary | ICD-10-CM

## 2021-08-02 DIAGNOSIS — I25118 Atherosclerotic heart disease of native coronary artery with other forms of angina pectoris: Secondary | ICD-10-CM

## 2021-08-02 DIAGNOSIS — Z01812 Encounter for preprocedural laboratory examination: Secondary | ICD-10-CM | POA: Insufficient documentation

## 2021-08-02 MED ORDER — RANOLAZINE ER 500 MG PO TB12: 500.0000 mg | ORAL_TABLET | Freq: Two times a day (BID) | ORAL | 3 refills | Status: DC

## 2021-08-02 NOTE — Patient Instructions (Addendum)
Medication Instructions:  No changes  If you need a refill on your cardiac medications before your next appointment, please call your pharmacy.    Lab work: No new labs needed   Testing/Procedures: No new testing needed   Follow-Up: At [facility], you and your health needs are our priority.  As part of our continuing mission to provide you with exceptional heart care, we have created designated Provider Care Teams.  These Care Teams include your primary Cardiologist (physician) and Advanced Practice Providers (APPs -  Physician Assistants and Nurse Practitioners) who all work together to provide you with the care you need, when you need it.  You will need a follow up appointment in 6 months  Providers on your designated Care Team:   Christopher Berge, NP Ryan Dunn, PA-C Cadence Furth, PA-C  COVID-19 Vaccine Information can be found at: [email or web address] For questions related to vaccine distribution or appointments, please email [email or web address] or call [phone].   

## 2021-08-03 ENCOUNTER — Telehealth (INDEPENDENT_AMBULATORY_CARE_PROVIDER_SITE_OTHER): Payer: PPO | Admitting: Family

## 2021-08-03 ENCOUNTER — Encounter: Payer: Self-pay | Admitting: Family

## 2021-08-03 VITALS — Ht 72.0 in | Wt 220.0 lb

## 2021-08-03 DIAGNOSIS — J0111 Acute recurrent frontal sinusitis: Secondary | ICD-10-CM | POA: Diagnosis not present

## 2021-08-03 LAB — SARS CORONAVIRUS 2 (TAT 6-24 HRS): SARS Coronavirus 2: NEGATIVE

## 2021-08-03 MED ORDER — AMOXICILLIN-POT CLAVULANATE 875-125 MG PO TABS: 1.0000 | ORAL_TABLET | Freq: Two times a day (BID) | ORAL | 0 refills | Status: DC

## 2021-08-03 NOTE — Assessment & Plan Note (Signed)
Antibiotic for Augmentin 875/125 mg p.o. twice daily for 10 days sent to patient's pharmacy.  Patient advised to start and take as directed.  Patient also advised to take Flonase daily not as needed as this will help him more with his symptoms in his nose.  Advised patient that we will try with this antibiotic but patient is to follow-up with ear nose and throat for ongoing recurrent sinusitis and for further evaluation especially if no improvement with Augmentin.  Patient does have appointment on January 2 with ENT, patient advised to follow-up and keep this appointment.

## 2021-08-03 NOTE — Progress Notes (Deleted)
Established Patient Office Visit  Subjective:  Patient ID: [name], male    DOB: [birth year]  Age: 80 y.o. 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 06/2019 Echo (in setting of COVID): EF 35-40%, mild LVH, g1 DD, glob HK; b. 09/2019 Echo: EF 50-55%, Gr1 DD; c. 03/2021 Echo: EF 50-55%, no rwma, mild LVH, GrI DD, nl RV fxn. RVSP 44.4mmHg. Mild-mod dil LA. Triv MR. Mild-mod Ao sclerosis.   Actinic keratosis    Anginal pain ([place])    BENIGN PROSTATIC HYPERTROPHY, WITH URINARY OBSTRUCTION 09/06/2007   CAD s/p CABG    a. 1990 s/p MI-->CABG x 2; b. 2002 s/p BMS to LCX; c. 06/2015 Cath: LM 50ost, LAD 100ost/p, 5m/d, D2 nl, LCX  patent stent, RCA 80p (small), LIMA->LAD nl, VG->D2 nl-->Med Rx; d. 05/2020 MV: fixed apical ant/apical defect. No signif ischemia; e. 04/2021 MV: EF 52%, no ischemia.   Carotid arterial disease ([name])    a. 08/2016 Carotid U/S: <39% bilat.   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 did not tolerate CPAP.   Osteoarthritis    PAD (peripheral artery disease) ([place])    a. 07/2017 LE duplex: RSFA 75-70m, LSFA 75-68m,  50-74d; c. 08/2018 Periph Angio: No signif AoIliac dzs. Mod-sev Ca2+ RSFA w/ diff dzs throughout- 3 vessel runoff. Borderline signif LSFA dzs w/ mod-sev Ca2+ vessels and 3 vessel runoff below the knee-->Med rx.   Pneumonia 07/2014   [facility] hospitalization   Pneumonia due to COVID-19 virus 06/02/2019   Pre-diabetes    PSA, INCREASED 07/09/2008   Spinal stenosis of lumbar region     Past Surgical History:  Procedure Laterality Date   ABDOMINAL AORTOGRAM W/LOWER EXTREMITY [name] 09/12/2018   Procedure: ABDOMINAL AORTOGRAM W/LOWER EXTREMITY;  Surgeon: [name], MD;  Location: [facility];  Service: Cardiovascular;  Laterality: [name];   APPENDECTOMY     rupture   BACK SURG[facility]     [facility]   CARDIAC CATHETERIZATION [name] 07/07/2015   Procedure: Left Heart Cath and Cors/Grafts Angiography;  Surgeon: [name], MD;  Location: [facility];  Service: Cardiovascular;  Laterality: [name];   CATARACT EXTRACTION, BILATERAL     CERVICAL SPINE SURGERY  12/2016   cervical stenosis [name])   COLONOSCOPY  03/2010   HP polyp, diverticulosis, rpt 10 yrs ([name])   CORONARY ARTERY BYPASS GRAFT  1990   3 vessel    CYSTOSCOPY  12/23/10   [facility]  EYE SURGERY     cataract   JOINT REPLACEMENT     KNEE ARTHROSCOPY Right    x2   LAMINOTOMY  1986   L5/S1 lumbar laminotomy for two ruptured discs/fusion   LUMBAR LAMINECTOMY/DECOMPRESSION MICRODISCECTOMY [name] 07/13/2016   Procedure: LUMBAR TWO-THREE, LUMBAR THREE-FOUR, LUMBAR FOUR-FIVE LAMINECTOMY AND FORAMINOTOMY;  Surgeon: [name], MD;  Location: [facility];  Service: Neurosurgery;  Laterality: [name];  LAMINECTOMY AND FORAMINOTOMY L2-L3, L3-L4,L4-L5   LUMBAR SPINE SURGERY  06/2020   Bilateral redo laminectomy/laminotomy/foraminotomies/medial facetectomy to decompress the bilateral L4 and L5 nerve roots [name])   TOTAL HIP ARTHROPLASTY Bilateral [id number]   TOTAL KNEE ARTHROPLASTY Right 03/06/2017   Procedure: RIGHT  TOTAL KNEE ARTHROPLASTY;  Surgeon: Gaynelle Arabian, MD;  [facility];  Service: Orthopedics;  Laterality: Right;    Family History  Problem Relation Age of Onset   Stroke Mother    Heart attack Mother    Diabetes Mother    Stroke Father    Heart disease Father    Kidney disease Father        PCKD   Cancer Sister        throat   Diabetes Brother    Kidney disease Sister        PCKD   Cancer Other        5/7 nephews with lung cancer    Social History   Socioeconomic History   Marital status: Married    Spouse name: Not on file   Number of children: Not on file   Years of education: Not on file   Highest education level: Not on file  Occupational History   Not on file  Tobacco Use   Smoking status: Former    Packs/day: 1.00    Years: 25.00    Pack years: 25.00    Types: Cigarettes    Quit date: 08/15/1988    Years since quitting: 32.9   Smokeless tobacco: Never  Vaping Use   Vaping Use: Never used  Substance and Sexual Activity   Alcohol use: No    Alcohol/week: 0.0 standard drinks   Drug use: No   Sexual activity: Yes  Other Topics Concern   Not on file  Social History Narrative   Married, wife [name], for 25 years in 2016. 1 daughter and 3 grandkids from first marriage.    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    Diet: good water, fruits/vegetables daily      OSA eval recommended while hospitalized with Covid 05/2019   Social Determinants of Health   Financial Resource Strain: Low Risk    Difficulty of Paying Living Expenses: Not hard at all  Food Insecurity: No Food Insecurity   Worried About Charity fundraiser in the Last Year: Never true   Ran Out of Food in the Last Year: Never true  Transportation Needs: No Transportation Needs   Lack of Transportation (Medical): No   Lack of Transportation (Non-Medical):  No  Physical Activity: Inactive   Days of Exercise per Week: 0 days   Minutes of Exercise per Session: 0 min  Stress: No Stress Concern Present   Feeling of Stress : Not at all  Social Connections: Not on file  Intimate Partner Violence: Not At Risk   Fear of Current or Ex-Partner: No   Emotionally Abused: No   Physically  Abused: No   Sexually Abused: No    Outpatient Medications Prior to Visit  Medication Sig Dispense Refill   acetaminophen (TYLENOL) 500 MG tablet Take 500 mg by mouth every 8 (eight) hours as needed.     aspirin EC 81 MG EC tablet Take 1 tablet (81 mg total) by mouth daily. 30 tablet 0   atorvastatin (LIPITOR) 20 MG tablet TAKE 1 TABLET BY MOUTH DAILY. 90 tablet 0   Cyanocobalamin (B-12) 1000 MCG SUBL Place 1 tablet under the tongue daily. 30 each    diclofenac Sodium (VOLTAREN) 1 % GEL Apply 2 g topically 4 (four) times daily. 50 g 0   docusate sodium (COLACE) 100 MG capsule Take 100 mg by mouth 2 (two) times daily as needed for mild constipation.     ezetimibe (ZETIA) 10 MG tablet TAKE 1 TABLET BY MOUTH ONCE DAILY. 90 tablet 2   fluticasone (FLONASE) 50 MCG/ACT nasal spray Place into both nostrils as needed for allergies or rhinitis.     furosemide (LASIX) 20 MG tablet Take 1 tablet (20 mg total) by mouth daily as needed. Restart taking this medication tomorrow 07/12/21 30 tablet 0   gabapentin (NEURONTIN) 600 MG tablet Take 600 mg by mouth 3 (three) times daily.     ipratropium-albuterol (DUONEB) 0.5-2.5 (3) MG/3ML SOLN Take 3 mLs by nebulization every 6 (six) hours as needed. 360 mL 11   isosorbide mononitrate (IMDUR) 30 MG 24 hr tablet Take 1 tablet (30 mg total) by mouth daily. 90 tablet 3   losartan (COZAAR) 25 MG tablet Take 1 tablet (25 mg total) by mouth daily. Restart taking this medication tomorrow 07/12/21 90 tablet 3   Nebulizers (COMPRESSOR/NEBULIZER) MISC Use as directed. 1 each 0   nitroGLYCERIN (NITROSTAT) 0.4 MG SL tablet Place 1 tablet (0.4 mg  total) under the tongue every 5 (five) minutes as needed for chest pain. 25 tablet 3   OXYGEN Inhale 2 L into the lungs at bedtime.     pantoprazole (PROTONIX) 40 MG tablet Take 40 mg by mouth daily.     polyethylene glycol (MIRALAX / GLYCOLAX) 17 g packet Take 17 g by mouth daily as needed for mild constipation.      PROAIR HFA 108 (90 Base) MCG/ACT inhaler TAKE 2 PUFFS INTO LUNGS EVERY 6 HOURS ASNEEDED FOR WHEEZING OR SHORTNESS OF BREATH 8.5 g 6   ranolazine (RANEXA) 500 MG 12 hr tablet Take 1 tablet (500 mg total) by mouth 2 (two) times daily. 180 tablet 3   tamsulosin (FLOMAX) 0.4 MG CAPS capsule Take 0.4 mg by mouth at bedtime.     tolterodine (DETROL LA) 4 MG 24 hr capsule TAKE 1 CAPSULE EVERY DAY 90 capsule 1   No facility-administered medications prior to visit.    Allergies  Allergen Reactions   Vioxx [Rofecoxib] Other (See Comments)    Hemorrhage    Spiriva Respimat [Tiotropium Bromide Monohydrate] Other (See Comments)    Elevated bp   Contrast Media [Iodinated Diagnostic Agents] Itching and Rash    Delayed reaction post abdominal aortagram.    Morphine Nausea Only and Other (See Comments)    Irritability     ROS Review of Systems    Objective:    Physical Exam  Ht 6' (1.829 m)    Wt 220 lb (99.8 kg)    BMI 29.84 kg/m  Wt Readings from Last 3 Encounters:  08/03/21 220 lb (99.8 kg)  08/02/21 221 lb 6 oz (100.4 kg)  07/23/21 216  lb (98 kg)     Health Maintenance Due  Topic Date Due   COVID-19 Vaccine (5 - Booster for Pfizer series) 03/16/2021    There are no preventive care reminders to display for this patient.  Lab Results  Component Value Date   TSH 2.77 02/16/2021   Lab Results  Component Value Date   WBC 8.8 07/11/2021   HGB 12.3 (L) 07/11/2021   HCT 36.3 (L) 07/11/2021   MCV 95.5 07/11/2021   PLT 183 07/11/2021   Lab Results  Component Value Date   NA 135 07/21/2021   K 4.5 07/21/2021   CO2 24 07/21/2021   GLUCOSE 111 (H) 07/21/2021    BUN 27 (H) 07/21/2021   CREATININE 1.52 (H) 07/21/2021   BILITOT 0.7 07/08/2021   ALKPHOS 48 07/08/2021   AST 19 07/08/2021   ALT 19 07/08/2021   PROT 7.6 07/08/2021   ALBUMIN 4.2 07/08/2021   CALCIUM 8.6 (L) 07/21/2021   ANIONGAP 7 07/21/2021   GFR 50.25 (L) 02/16/2021   Lab Results  Component Value Date   HGBA1C 6.6 (A) 07/23/2021      Assessment & Plan:   Problem List Items Addressed This Visit   None   No orders of the defined types were placed in this encounter.   Follow-up: No follow-ups on file.    Eugenia Pancoast, FNP

## 2021-08-03 NOTE — Progress Notes (Signed)
MyChart Video Visit    Virtual Visit via Video Note   This visit type was conducted due to national recommendations for restrictions regarding the COVID-19 Pandemic (e.g. social distancing) in an effort to limit this patient's exposure and mitigate transmission in our community. This patient is at least at moderate risk for complications without adequate follow up. This format is felt to be most appropriate for this patient at this time. Physical exam was limited by quality of the video and audio technology used for the visit. CMA was able to get the patient set up on a video visit.  Patient location: Home. Patient and provider in visit Provider location: Office  I discussed the limitations of evaluation and management by telemedicine and the availability of in person appointments. The patient expressed understanding and agreed to proceed.  Visit Date: 08/03/2021  Today's healthcare provider: Eugenia Pancoast, FNP     Subjective:    Patient ID: [name], male    DOB: 1941/05/13, 80 y.o.   MRN: [id number]  Chief Complaint  Patient presents with   Sinus Problem    Sinus Problem Associated symptoms include congestion and ear pain (left ear fullness/congestion). Pertinent negatives include no chills, coughing, shortness of breath or sore throat.   Pt here with c/o sinus concerns.   Sinus pressure frontal, and behind eyes at times. Positive for sinus congestion and blowing nose a lot, green discharge. No fever and or chills. Denies cough and or chest congestion. He does c/o left ear discomfort, feels full. ENT has placed a tube to help it drain.   Has f/u with ENT in the beginning of January. Last saw ENT back in November so much as he can remember. He was in the hospital, and given a ZPACK as well as robitussin which helped him a bit back then but not very much relief. Is using saline solution daily but not taking flonase daily, only prn.   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 06/2019 Echo (in setting of COVID): EF 35-40%, mild LVH, g1 DD, glob HK; b. 09/2019 Echo: EF 50-55%, Gr1 DD; c. 03/2021 Echo: EF 50-55%, no rwma, mild LVH, GrI DD, nl RV fxn. RVSP 44.44mmHg. Mild-mod dil LA. Triv MR. Mild-mod Ao sclerosis.   Actinic keratosis    Anginal pain ([place])    BENIGN PROSTATIC HYPERTROPHY, WITH URINARY OBSTRUCTION 09/06/2007   CAD s/p CABG    a. 1990 s/p MI-->CABG x 2; b. 2002 s/p BMS to LCX; c. 06/2015 Cath: LM 50ost, LAD 100ost/p, 42m/d, D2 nl, LCX  patent stent, RCA 80p (small), LIMA->LAD nl, VG->D2 nl-->Med Rx; d. 05/2020 MV: fixed apical ant/apical defect. No signif ischemia; e. 04/2021 MV: EF 52%, no ischemia.   Carotid arterial disease ([place])    a. 08/2016 Carotid U/S: <39% bilat.   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 did not tolerate CPAP.   Osteoarthritis    PAD (peripheral artery disease) ([place])    a. 07/2017 LE duplex: RSFA 75-49m, LSFA 75-47m, 50-74d; c. 08/2018 Periph Angio: No signif AoIliac dzs. Mod-sev Ca2+ RSFA w/ diff dzs throughout- 3 vessel runoff. Borderline signif LSFA dzs w/ mod-sev Ca2+ vessels and 3 vessel runoff below the knee-->Med rx.   Pneumonia 07/2014   [facility] hospitalization   Pneumonia due to COVID-19 virus  06/02/2019   Pre-diabetes    PSA, INCREASED 07/09/2008   Spinal stenosis of lumbar region     Past Surgical History:  Procedure Laterality Date   ABDOMINAL AORTOGRAM W/LOWER EXTREMITY [name] 09/12/2018   Procedure: ABDOMINAL AORTOGRAM W/LOWER EXTREMITY;  Surgeon: [name], MD;  Location: [facility];  Service: Cardiovascular;  Laterality: [name];   APPENDECTOMY     rupture   BACK SURGERY     [facility] and [facility]   CARDIAC CATHETERIZATION [name] 07/07/2015   Procedure: Left Heart Cath and Cors/Grafts Angiography;  Surgeon: [name], MD;  Location: [facility];  Service: Cardiovascular;  Laterality: [name];   CATARACT EXTRACTION, BILATERAL     CERVICAL SPINE SURGERY  12/2016   cervical stenosis [name])   COLONOSCOPY  03/2010   HP polyp, diverticulosis, rpt 10 yrs ([name])   CORONARY ARTERY BYPASS GRAFT  1990   3 vessel    CYSTOSCOPY  12/23/10   [facility]   EYE SURGERY     cataract   JOINT REPLACEMENT     KNEE ARTHROSCOPY Right    x2   LAMINOTOMY  1986   L5/S1 lumbar laminotomy for two ruptured discs/fusion   LUMBAR LAMINECTOMY/DECOMPRESSION MICRODISCECTOMY [name] 07/13/2016   Procedure: LUMBAR TWO-THREE, LUMBAR THREE-FOUR, LUMBAR FOUR-FIVE LAMINECTOMY AND FORAMINOTOMY;  Surgeon: [name], MD;  Location: [facility];  Service: Neurosurgery;  Laterality: [name];  LAMINECTOMY AND FORAMINOTOMY L2-L3, L3-L4,L4-L5   LUMBAR SPINE SURGERY  06/2020   Bilateral redo laminectomy/laminotomy/foraminotomies/medial facetectomy to decompress the bilateral L4 and L5 nerve roots [name])   TOTAL HIP ARTHROPLASTY Bilateral [id number]   TOTAL KNEE ARTHROPLASTY Right 03/06/2017   Procedure: RIGHT TOTAL KNEE ARTHROPLASTY;  Surgeon: Gaynelle Arabian, MD;  [facility];  Service: Orthopedics;  Laterality: Right;    Family History  Problem Relation Age of Onset   Stroke Mother    Heart attack Mother    Diabetes Mother    Stroke Father    Heart disease Father    Kidney  disease Father        PCKD   Cancer Sister        throat   Diabetes Brother    Kidney disease Sister        PCKD   Cancer Other        5/7 nephews with lung cancer    Social History   Socioeconomic History   Marital status: Married    Spouse name: Not on file   Number of children: Not on file   Years of education: Not on file   Highest education level: Not on file  Occupational History   Not on file  Tobacco Use   Smoking status: Former    Packs/day: 1.00    Years: 25.00    Pack years: 25.00    Types:  Cigarettes    Quit date: 08/15/1988    Years since quitting: 32.9   Smokeless tobacco: Never  Vaping Use   Vaping Use: Never used  Substance and Sexual Activity   Alcohol use: No    Alcohol/week: 0.0 standard drinks   Drug use: No   Sexual activity: Yes  Other Topics Concern   Not on file  Social History Narrative   Married, wife [name], for 25 years in 2016. 1 daughter and 3 grandkids from first marriage.    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    Diet: good water, fruits/vegetables daily      OSA eval recommended while hospitalized with Covid 05/2019   Social Determinants of Health   Financial Resource Strain: Low Risk    Difficulty of Paying Living Expenses: Not hard at all  Food Insecurity: No Food Insecurity   Worried About Charity fundraiser in the Last Year: Never true   Ran Out of Food in the Last Year: Never true  Transportation Needs: No Transportation Needs   Lack of Transportation (Medical): No   Lack of Transportation (Non-Medical): No  Physical Activity: Inactive   Days of Exercise per Week: 0 days   Minutes of Exercise per Session: 0 min  Stress: No Stress Concern Present   Feeling of Stress : Not at all  Social Connections: Not on file  Intimate Partner Violence: Not At Risk   Fear of Current or  Ex-Partner: No   Emotionally Abused: No   Physically Abused: No   Sexually Abused: No    Outpatient Medications Prior to Visit  Medication Sig Dispense Refill   acetaminophen (TYLENOL) 500 MG tablet Take 500 mg by mouth every 8 (eight) hours as needed.     aspirin EC 81 MG EC tablet Take 1 tablet (81 mg total) by mouth daily. 30 tablet 0   atorvastatin (LIPITOR) 20 MG tablet TAKE 1 TABLET BY MOUTH DAILY. 90 tablet 0   Cyanocobalamin (B-12) 1000 MCG SUBL Place 1 tablet under the tongue daily. 30 each    diclofenac Sodium (VOLTAREN) 1 % GEL Apply 2 g topically 4 (four) times daily. 50 g 0   docusate sodium (COLACE) 100 MG capsule Take 100 mg by mouth 2 (two) times daily as needed for mild constipation.     ezetimibe (ZETIA) 10 MG tablet TAKE 1 TABLET BY MOUTH ONCE DAILY. 90 tablet 2   fluticasone (FLONASE) 50 MCG/ACT nasal spray Place into both nostrils as needed for allergies or rhinitis.     furosemide (LASIX) 20 MG tablet Take 1 tablet (20 mg total) by mouth daily as needed. Restart taking this medication tomorrow 07/12/21 30 tablet 0   gabapentin (NEURONTIN) 600 MG tablet Take 600 mg by mouth 3 (three) times daily.     ipratropium-albuterol (DUONEB) 0.5-2.5 (3) MG/3ML SOLN Take 3 mLs by nebulization every 6 (six) hours as needed. 360 mL 11   isosorbide mononitrate (IMDUR) 30 MG 24 hr tablet Take 1 tablet (30 mg total) by mouth daily. 90 tablet 3   losartan (COZAAR) 25 MG tablet Take 1 tablet (25 mg total) by mouth daily. Restart taking this medication tomorrow 07/12/21 90 tablet 3   Nebulizers (COMPRESSOR/NEBULIZER) MISC Use as directed. 1 each 0   nitroGLYCERIN (NITROSTAT) 0.4 MG SL tablet Place 1 tablet (0.4 mg total) under the tongue  every 5 (five) minutes as needed for chest pain. 25 tablet 3   OXYGEN Inhale 2 L into the lungs at bedtime.     pantoprazole (PROTONIX) 40 MG tablet Take 40 mg by mouth daily.     polyethylene glycol (MIRALAX / GLYCOLAX) 17 g packet Take 17 g by mouth daily  as needed for mild constipation.      PROAIR HFA 108 (90 Base) MCG/ACT inhaler TAKE 2 PUFFS INTO LUNGS EVERY 6 HOURS ASNEEDED FOR WHEEZING OR SHORTNESS OF BREATH 8.5 g 6   ranolazine (RANEXA) 500 MG 12 hr tablet Take 1 tablet (500 mg total) by mouth 2 (two) times daily. 180 tablet 3   tamsulosin (FLOMAX) 0.4 MG CAPS capsule Take 0.4 mg by mouth at bedtime.     tolterodine (DETROL LA) 4 MG 24 hr capsule TAKE 1 CAPSULE EVERY DAY 90 capsule 1   No facility-administered medications prior to visit.    Allergies  Allergen Reactions   Vioxx [Rofecoxib] Other (See Comments)    Hemorrhage    Spiriva Respimat [Tiotropium Bromide Monohydrate] Other (See Comments)    Elevated bp   Contrast Media [Iodinated Diagnostic Agents] Itching and Rash    Delayed reaction post abdominal aortagram.    Morphine Nausea Only and Other (See Comments)    Irritability     Review of Systems  Constitutional:  Negative for chills and fever.  HENT:  Positive for congestion, ear pain (left ear fullness/congestion) and sinus pain (frontal). Negative for ear discharge and sore throat.   Respiratory:  Negative for cough, shortness of breath and wheezing.   Cardiovascular:  Negative for chest pain and palpitations.      Objective:    Physical Exam Constitutional:      General: He is not in acute distress.    Appearance: Normal appearance. He is not ill-appearing, toxic-appearing or diaphoretic.  HENT:     Head: Normocephalic.  Pulmonary:     Effort: Pulmonary effort is normal.  Neurological:     General: No focal deficit present.     Mental Status: He is alert and oriented to person, place, and time.  Psychiatric:        Mood and Affect: Mood normal.        Behavior: Behavior normal.        Thought Content: Thought content normal.    Ht 6' (1.829 m)    Wt 220 lb (99.8 kg)    BMI 29.84 kg/m  Wt Readings from Last 3 Encounters:  08/03/21 220 lb (99.8 kg)  08/02/21 221 lb 6 oz (100.4 kg)  07/23/21 216 lb  (98 kg)       Assessment & Plan:   Problem List Items Addressed This Visit       Respiratory   Acute recurrent frontal sinusitis - Primary    Antibiotic for Augmentin 875/125 mg p.o. twice daily for 10 days sent to patient's pharmacy.  Patient advised to start and take as directed.  Patient also advised to take Flonase daily not as needed as this will help him more with his symptoms in his nose.  Advised patient that we will try with this antibiotic but patient is to follow-up with ear nose and throat for ongoing recurrent sinusitis and for further evaluation especially if no improvement with Augmentin.  Patient does have appointment on January 2 with ENT, patient advised to follow-up and keep this appointment.      Relevant Medications   amoxicillin-clavulanate (AUGMENTIN) 875-125 MG tablet  I am having [name] "[name]" start on amoxicillin-clavulanate. I am also having him maintain his polyethylene glycol, B-12, aspirin, tamsulosin, fluticasone, acetaminophen, docusate sodium, ProAir HFA, nitroGLYCERIN, tolterodine, gabapentin, diclofenac Sodium, OXYGEN, ipratropium-albuterol, Compressor/Nebulizer, furosemide, losartan, ezetimibe, atorvastatin, isosorbide mononitrate, ranolazine, and pantoprazole.  Meds ordered this encounter  Medications   amoxicillin-clavulanate (AUGMENTIN) 875-125 MG tablet    Sig: Take 1 tablet by mouth 2 (two) times daily.    Dispense:  20 tablet    Refill:  0    Order Specific Question:   Supervising Provider    Answer:   BEDSOLE, AMY E [[id number]]    I discussed the assessment and treatment plan with the patient. The patient was provided an opportunity to ask questions and all were answered. The patient agreed with the plan and demonstrated an understanding of the instructions.   The patient was advised to call back or seek an in-person evaluation if the symptoms worsen or if the condition fails to improve as anticipated.  I provided 15 minutes of  face-to-face time during this encounter.   Eugenia Pancoast, [facility] [phone] (phone) [phone] (fax)  [employer] [employer]

## 2021-08-05 ENCOUNTER — Ambulatory Visit: Payer: PPO | Attending: Pulmonary Disease

## 2021-08-05 DIAGNOSIS — Z6830 Body mass index (BMI) 30.0-30.9, adult: Secondary | ICD-10-CM | POA: Diagnosis not present

## 2021-08-05 DIAGNOSIS — G4761 Periodic limb movement disorder: Secondary | ICD-10-CM | POA: Diagnosis not present

## 2021-08-05 DIAGNOSIS — E669 Obesity, unspecified: Secondary | ICD-10-CM | POA: Insufficient documentation

## 2021-08-05 DIAGNOSIS — G479 Sleep disorder, unspecified: Secondary | ICD-10-CM | POA: Diagnosis present

## 2021-08-05 DIAGNOSIS — G4733 Obstructive sleep apnea (adult) (pediatric): Secondary | ICD-10-CM | POA: Insufficient documentation

## 2021-08-05 DIAGNOSIS — I272 Pulmonary hypertension, unspecified: Secondary | ICD-10-CM | POA: Insufficient documentation

## 2021-08-06 ENCOUNTER — Other Ambulatory Visit: Payer: Self-pay

## 2021-08-10 DIAGNOSIS — J449 Chronic obstructive pulmonary disease, unspecified: Secondary | ICD-10-CM | POA: Diagnosis not present

## 2021-08-10 DIAGNOSIS — M1710 Unilateral primary osteoarthritis, unspecified knee: Secondary | ICD-10-CM | POA: Diagnosis not present

## 2021-08-15 DIAGNOSIS — C801 Malignant (primary) neoplasm, unspecified: Secondary | ICD-10-CM

## 2021-08-15 DIAGNOSIS — C679 Malignant neoplasm of bladder, unspecified: Secondary | ICD-10-CM

## 2021-08-15 HISTORY — DX: Malignant (primary) neoplasm, unspecified: C80.1

## 2021-08-15 HISTORY — DX: Malignant neoplasm of bladder, unspecified: C67.9

## 2021-08-17 ENCOUNTER — Telehealth (INDEPENDENT_AMBULATORY_CARE_PROVIDER_SITE_OTHER): Payer: PPO | Admitting: Pulmonary Disease

## 2021-08-17 DIAGNOSIS — G4733 Obstructive sleep apnea (adult) (pediatric): Secondary | ICD-10-CM

## 2021-08-17 NOTE — Telephone Encounter (Signed)
NPSG >> severe OSA with desat  COnsider autoCPAP vs formal CPAP ttirations tudy

## 2021-08-18 DIAGNOSIS — R102 Pelvic and perineal pain: Secondary | ICD-10-CM | POA: Diagnosis not present

## 2021-08-18 DIAGNOSIS — M79661 Pain in right lower leg: Secondary | ICD-10-CM | POA: Diagnosis not present

## 2021-08-18 DIAGNOSIS — Z96642 Presence of left artificial hip joint: Secondary | ICD-10-CM | POA: Diagnosis not present

## 2021-08-18 DIAGNOSIS — M79604 Pain in right leg: Secondary | ICD-10-CM | POA: Diagnosis not present

## 2021-08-18 NOTE — Telephone Encounter (Signed)
Please let patient know sleep study showed severe OSA, needs CPAP titration study (please order). Dr. Patsey Berthold can review results in greater detail and I can manage in office

## 2021-08-18 NOTE — Addendum Note (Signed)
Addended by: Claudette Head A on: 08/18/2021 04:40 PM   Modules accepted: Orders

## 2021-08-18 NOTE — Telephone Encounter (Signed)
Patient is aware of results and voiced his understanding.  Titration study ordered.  Nothing further needed at this time.

## 2021-08-23 DIAGNOSIS — H6982 Other specified disorders of Eustachian tube, left ear: Secondary | ICD-10-CM | POA: Diagnosis not present

## 2021-08-29 DIAGNOSIS — R102 Pelvic and perineal pain: Secondary | ICD-10-CM | POA: Diagnosis not present

## 2021-08-29 DIAGNOSIS — M79604 Pain in right leg: Secondary | ICD-10-CM | POA: Diagnosis not present

## 2021-08-30 ENCOUNTER — Telehealth: Payer: Self-pay

## 2021-08-30 NOTE — Telephone Encounter (Signed)
Incoming call from PA at [facility] who states this is a mutual patient whom they ordered a CT scan on. The imaging showed a bladder mass that is suspicious for neoplasm. Appointment scheduled.

## 2021-09-03 ENCOUNTER — Encounter: Payer: Self-pay | Admitting: Urology

## 2021-09-03 ENCOUNTER — Ambulatory Visit: Payer: PPO | Admitting: Urology

## 2021-09-03 ENCOUNTER — Other Ambulatory Visit: Payer: Self-pay

## 2021-09-03 VITALS — BP 127/64 | HR 106 | Ht 72.0 in | Wt 220.0 lb

## 2021-09-03 DIAGNOSIS — N138 Other obstructive and reflux uropathy: Secondary | ICD-10-CM

## 2021-09-03 DIAGNOSIS — N401 Enlarged prostate with lower urinary tract symptoms: Secondary | ICD-10-CM | POA: Diagnosis not present

## 2021-09-03 DIAGNOSIS — N3289 Other specified disorders of bladder: Secondary | ICD-10-CM | POA: Diagnosis not present

## 2021-09-03 NOTE — Progress Notes (Signed)
09/03/2021 8:17 AM   [name] 1941/01/06 [id number]  Referring provider: Ria Bush, MD [place],  [place] [address]  Chief Complaint  Patient presents with   Other    Bladder mass    HPI: 81 y.o. male followed for BPH who I initially saw August 2022.  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 06/2019 Echo (in setting of COVID): EF 35-40%, mild LVH, g1 DD, glob HK; b. 09/2019 Echo: EF 50-55%, Gr1 DD; c. 03/2021 Echo: EF 50-55%, no rwma, mild LVH, GrI DD, nl RV fxn. RVSP 44.20mmHg. Mild-mod dil LA. Triv MR. Mild-mod Ao sclerosis.   Actinic keratosis    Anginal pain ([place])    BENIGN PROSTATIC HYPERTROPHY, WITH URINARY OBSTRUCTION 09/06/2007   CAD s/p CABG    a. 1990 s/p MI-->CABG x 2; b. 2002 s/p BMS to LCX; c. 06/2015 Cath: LM 50ost, LAD 100ost/p, 53m/d, D2 nl, LCX  patent stent, RCA 80p (small), LIMA->LAD nl, VG->D2 nl-->Med Rx; d. 05/2020 MV: fixed apical ant/apical defect. No signif ischemia; e. 04/2021 MV: EF 52%, no ischemia.   Carotid arterial disease ([place])    a. 08/2016 Carotid U/S: <39% bilat.   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 did not tolerate CPAP.   Osteoarthritis    PAD (peripheral artery disease) ([place])    a. 07/2017 LE duplex: RSFA 75-66m, LSFA 75-67m, 50-74d; c. 08/2018 Periph Angio: No signif AoIliac dzs. Mod-sev Ca2+ RSFA w/ diff dzs throughout- 3 vessel runoff. Borderline signif LSFA dzs w/ mod-sev Ca2+ vessels and 3 vessel runoff below the knee-->Med rx.   Pneumonia 07/2014   [facility] hospitalization   Pneumonia due to COVID-19 virus 06/02/2019   Pre-diabetes    PSA, INCREASED 07/09/2008   Spinal stenosis of lumbar region     Surgical History: Past Surgical History:  Procedure Laterality Date   ABDOMINAL AORTOGRAM W/LOWER EXTREMITY [name] 09/12/2018   Procedure: ABDOMINAL AORTOGRAM W/LOWER EXTREMITY;  Surgeon: [name], MD;  Location: [facility];  Service: Cardiovascular;  Laterality: [name];   APPENDECTOMY     rupture   BACK SURGERY     1984 and then [facility]   CARDIAC CATHETERIZATION [name] 07/07/2015   Procedure: Left Heart Cath and Cors/Grafts Angiography;  Surgeon: [name], MD;  Location: [facility]  INVASIVE CV LAB;  Service: Cardiovascular;  Laterality: [name];   CATARACT EXTRACTION, BILATERAL     CERVICAL SPINE SURGERY  12/2016   cervical stenosis [name])   COLONOSCOPY  03/2010   HP polyp, diverticulosis, rpt 10 yrs ([name])   CORONARY ARTERY BYPASS GRAFT  1990   3 vessel    CYSTOSCOPY  12/23/10   [facility]   EYE SURGERY     cataract   JOINT REPLACEMENT     KNEE ARTHROSCOPY Right    x2   LAMINOTOMY  1986   L5/S1 lumbar laminotomy for two ruptured discs/fusion   LUMBAR LAMINECTOMY/DECOMPRESSION MICRODISCECTOMY [name] 07/13/2016   Procedure: LUMBAR TWO-THREE, LUMBAR THREE-FOUR, LUMBAR FOUR-FIVE LAMINECTOMY AND FORAMINOTOMY;  Surgeon: [name], MD;   Location: [facility];  Service: Neurosurgery;  Laterality: [name];  LAMINECTOMY AND FORAMINOTOMY L2-L3, L3-L4,L4-L5   LUMBAR SPINE SURGERY  06/2020   Bilateral redo laminectomy/laminotomy/foraminotomies/medial facetectomy to decompress the bilateral L4 and L5 nerve roots [name])   TOTAL HIP ARTHROPLASTY Bilateral [id number]   TOTAL KNEE ARTHROPLASTY Right 03/06/2017   Procedure: RIGHT TOTAL KNEE ARTHROPLASTY;  Surgeon: Gaynelle Arabian, MD;  Location: [facility];  Service: Orthopedics;  Laterality: Right;    Home Medications:  Allergies as of 09/03/2021       Reactions   Vioxx [rofecoxib] Other (See Comments)   Hemorrhage    Spiriva Respimat [tiotropium Bromide Monohydrate] Other (See Comments)   Elevated bp   Contrast Media [iodinated Contrast Media] Itching, Rash   Delayed reaction post abdominal aortagram.    Morphine Nausea Only, Other (See Comments)   Irritability        Medication List        Accurate as of September 03, 2021  8:17 AM. If you have any questions, ask your nurse or doctor.          STOP taking these medications    amoxicillin-clavulanate 875-125 MG tablet Commonly known as: AUGMENTIN Stopped by: Abbie Sons, MD       TAKE these medications    acetaminophen 500 MG tablet Commonly known as: TYLENOL Take 500 mg by mouth every 8 (eight) hours as needed.   aspirin 81 MG EC tablet Take 1 tablet (81 mg total) by mouth daily.   atorvastatin 20 MG tablet Commonly known as: LIPITOR TAKE 1 TABLET BY MOUTH DAILY.   B-12 1000 MCG Subl Place 1 tablet under the tongue daily.   Compressor/Nebulizer Misc Use as directed.   diclofenac Sodium 1 % Gel Commonly known as: Voltaren Apply 2 g topically 4 (four) times daily.   docusate sodium 100 MG capsule Commonly known as: COLACE Take 100 mg by mouth 2 (two) times daily as needed for mild constipation.   ezetimibe 10 MG tablet Commonly known as: ZETIA TAKE 1 TABLET BY MOUTH ONCE DAILY.   fluticasone 50  MCG/ACT nasal spray Commonly known as: FLONASE Place into both nostrils as needed for allergies or rhinitis.   furosemide 20 MG tablet Commonly known as: LASIX Take 1 tablet (20 mg total) by mouth daily as needed. Restart taking this medication tomorrow 07/12/21   gabapentin 600 MG tablet Commonly known as: NEURONTIN Take 600 mg by mouth 3 (three) times daily.   ipratropium-albuterol 0.5-2.5 (3) MG/3ML Soln Commonly known as: DUONEB Take 3 mLs by nebulization every 6 (six) hours as needed.   isosorbide mononitrate 30 MG 24 hr tablet Commonly known as: IMDUR Take 1 tablet (30 mg total) by mouth daily.   losartan 25 MG tablet Commonly known  as: COZAAR Take 1 tablet (25 mg total) by mouth daily. Restart taking this medication tomorrow 07/12/21   nitroGLYCERIN 0.4 MG SL tablet Commonly known as: NITROSTAT Place 1 tablet (0.4 mg total) under the tongue every 5 (five) minutes as needed for chest pain.   OXYGEN Inhale 2 L into the lungs at bedtime.   pantoprazole 40 MG tablet Commonly known as: PROTONIX Take 40 mg by mouth daily.   polyethylene glycol 17 g packet Commonly known as: MIRALAX / GLYCOLAX Take 17 g by mouth daily as needed for mild constipation.   ProAir HFA 108 (90 Base) MCG/ACT inhaler Generic drug: albuterol TAKE 2 PUFFS INTO LUNGS EVERY 6 HOURS ASNEEDED FOR WHEEZING OR SHORTNESS OF BREATH   ranolazine 500 MG 12 hr tablet Commonly known as: RANEXA Take 1 tablet (500 mg total) by mouth 2 (two) times daily.   tamsulosin 0.4 MG Caps capsule Commonly known as: FLOMAX Take 0.4 mg by mouth at bedtime.   tolterodine 4 MG 24 hr capsule Commonly known as: DETROL LA TAKE 1 CAPSULE EVERY DAY        Allergies:  Allergies  Allergen Reactions   Vioxx [Rofecoxib] Other (See Comments)    Hemorrhage    Spiriva Respimat [Tiotropium Bromide Monohydrate] Other (See Comments)    Elevated bp   Contrast Media [Iodinated Contrast Media] Itching and Rash    Delayed  reaction post abdominal aortagram.    Morphine Nausea Only and Other (See Comments)    Irritability     Family History: Family History  Problem Relation Age of Onset   Stroke Mother    Heart attack Mother    Diabetes Mother    Stroke Father    Heart disease Father    Kidney disease Father        PCKD   Cancer Sister        throat   Diabetes Brother    Kidney disease Sister        PCKD   Cancer Other        5/7 nephews with lung cancer    Social History:  reports that he quit smoking about 33 years ago. His smoking use included cigarettes. He has a 25.00 pack-year smoking history. He has never used smokeless tobacco. He reports that he does not drink alcohol and does not use drugs.   Physical Exam: BP 127/64    Pulse (!) 106    Ht 6' (1.829 m)    Wt 220 lb (99.8 kg)    BMI 29.84 kg/m   Constitutional:  Alert and oriented, No acute distress. HEENT: [place] AT, moist mucus membranes.  Trachea midline, no masses. Cardiovascular: No clubbing, cyanosis, or edema. Respiratory: Normal respiratory effort, no increased work of breathing. Psychiatric: Normal mood and affect.   Assessment & Plan:    1.  Bladder mass Small incidental bladder mass found on pelvic MRI This may be intravesical median lobe prostate tissue Schedule cystoscopy for further evaluation   Abbie Sons, MD  [facility] [address], [place], [place] [address] [phone]

## 2021-09-09 DIAGNOSIS — Z961 Presence of intraocular lens: Secondary | ICD-10-CM | POA: Diagnosis not present

## 2021-09-09 LAB — HM DIABETES EYE EXAM

## 2021-09-10 DIAGNOSIS — J449 Chronic obstructive pulmonary disease, unspecified: Secondary | ICD-10-CM | POA: Diagnosis not present

## 2021-09-10 DIAGNOSIS — M1710 Unilateral primary osteoarthritis, unspecified knee: Secondary | ICD-10-CM | POA: Diagnosis not present

## 2021-09-14 ENCOUNTER — Other Ambulatory Visit: Payer: Self-pay

## 2021-09-14 ENCOUNTER — Ambulatory Visit: Payer: PPO | Admitting: Dermatology

## 2021-09-14 ENCOUNTER — Encounter: Payer: Self-pay | Admitting: Dermatology

## 2021-09-14 DIAGNOSIS — L700 Acne vulgaris: Secondary | ICD-10-CM | POA: Diagnosis not present

## 2021-09-14 DIAGNOSIS — D692 Other nonthrombocytopenic purpura: Secondary | ICD-10-CM

## 2021-09-14 DIAGNOSIS — L578 Other skin changes due to chronic exposure to nonionizing radiation: Secondary | ICD-10-CM

## 2021-09-14 DIAGNOSIS — L570 Actinic keratosis: Secondary | ICD-10-CM

## 2021-09-14 DIAGNOSIS — L738 Other specified follicular disorders: Secondary | ICD-10-CM | POA: Diagnosis not present

## 2021-09-14 NOTE — Patient Instructions (Signed)
Cryotherapy Aftercare  Wash gently with soap and water everyday.   Apply Vaseline and Band-Aid daily until healed.   Prior to procedure, discussed risks of blister formation, small wound, skin dyspigmentation, or rare scar following cryotherapy. Recommend Vaseline ointment to treated areas while healing.    If You Need Anything After Your Visit  If you have any questions or concerns for your doctor, please call our main line at [phone] and press option 4 to reach your doctor's medical assistant. If no one answers, please leave a voicemail as directed and we will return your call as soon as possible. Messages left after 4 pm will be answered the following business day.   You may also send [place] a message via [place]. We typically respond to MyChart messages within 1-2 business days.  For prescription refills, please ask your pharmacy to contact our office. Our fax number is [phone].  If you have an urgent issue when the clinic is closed that cannot wait until the next business day, you can page your doctor at the number below.    Please note that while we do our best to be available for urgent issues outside of office hours, we are not available 24/7.   If you have an urgent issue and are unable to reach [place], you may choose to seek medical care at your doctor's office, retail clinic, urgent care center, or emergency room.  If you have a medical emergency, please immediately call 911 or go to the emergency department.  Pager Numbers  - Dr. Nehemiah Massed: [phone]  - Dr. Laurence Ferrari: [phone]  - Dr. Nicole Kindred: [phone]  In the event of inclement weather, please call our main line at [phone] for an update on the status of any delays or closures.  Dermatology Medication Tips: Please keep the boxes that topical medications come in in order to help keep track of the instructions about where and how to use these. Pharmacies typically print the medication instructions only on the  boxes and not directly on the medication tubes.   If your medication is too expensive, please contact our office at [phone] option 4 or send [place] a message through [place].   We are unable to tell what your co-pay for medications will be in advance as this is different depending on your insurance coverage. However, we may be able to find a substitute medication at lower cost or fill out paperwork to get insurance to cover a needed medication.   If a prior authorization is required to get your medication covered by your insurance company, please allow [place] 1-2 business days to complete this process.  Drug prices often vary depending on where the prescription is filled and some pharmacies may offer cheaper prices.  The website www.goodrx.com contains coupons for medications through different pharmacies. The prices here do not account for what the cost may be with help from insurance (it may be cheaper with your insurance), but the website can give you the price if you did not use any insurance.  - You can print the associated coupon and take it with your prescription to the pharmacy.  - You may also stop by our office during regular business hours and pick up a GoodRx coupon card.  - If you need your prescription sent electronically to a different pharmacy, notify our office through [facility] or by phone at [phone] option 4.     Si Usted Necesita Algo Despus de Su Visita  Tambin puede enviarnos un mensaje a  travs de MyChart. Por lo general respondemos a los mensajes de MyChart en el transcurso de 1 a 2 das hbiles.  Para renovar recetas, por favor pida a su farmacia que se ponga en contacto con nuestra oficina. Harland Dingwall de fax es [place] [phone].  Si tiene un asunto urgente cuando la clnica est cerrada y que no puede esperar hasta el siguiente da hbil, puede llamar/localizar a su doctor(a) al nmero que aparece a continuacin.   Por favor, tenga en cuenta que  aunque hacemos todo lo posible para estar disponibles para asuntos urgentes fuera del horario de [place], no estamos disponibles las 24 horas del da, los 7 das de la [place].   Si tiene un problema urgente y no puede comunicarse con nosotros, puede optar por buscar atencin mdica  en el consultorio de su doctor(a), en una clnica privada, en un centro de atencin urgente o en una sala de emergencias.  Si tiene Engineering geologist, por favor llame inmediatamente al 911 o vaya a la sala de emergencias.  Nmeros de bper  - Dr. Nehemiah Massed: [phone]  - Dra. Moye: [phone]  - Dra. Nicole Kindred: [phone]  En caso de inclemencias del [place], por favor llame a Johnsie Kindred principal al [phone] para una actualizacin sobre el [place] de cualquier retraso o cierre.  Consejos para la medicacin en dermatologa: Por favor, guarde las cajas en las que vienen los medicamentos de uso tpico para ayudarle a seguir las instrucciones sobre dnde y cmo usarlos. Las farmacias generalmente imprimen las instrucciones del medicamento slo en las cajas y no directamente en los tubos del [place].   Si su medicamento es muy caro, por favor, pngase en contacto con Zigmund Daniel llamando al [phone] y presione la opcin 4 o envenos un mensaje a travs de Pharmacist, community.   No podemos decirle cul ser su copago por los medicamentos por adelantado ya que esto es diferente dependiendo de la cobertura de su seguro. Sin embargo, es posible que podamos encontrar un medicamento sustituto a Electrical engineer un formulario para que el seguro cubra el medicamento que se considera necesario.   Si se requiere una autorizacin previa para que su compaa de seguros [place] su medicamento, por favor permtanos de 1 a 2 das hbiles para completar este proceso.  Los precios de los medicamentos varan con frecuencia dependiendo del Environmental consultant de dnde se surte la receta y alguna farmacias pueden ofrecer precios ms  baratos.  El sitio web www.goodrx.com tiene cupones para medicamentos de Airline pilot. Los precios aqu no tienen en cuenta lo que podra costar con la ayuda del seguro (puede ser ms barato con su seguro), pero el sitio web puede darle el precio si no utiliz Research scientist (physical sciences).  - Puede imprimir el cupn correspondiente y llevarlo con su receta a la farmacia.  - Tambin puede pasar por nuestra oficina durante el horario de atencin regular y Charity fundraiser una tarjeta de cupones de GoodRx.  - Si necesita que su receta se enve electrnicamente a una farmacia diferente, informe a nuestra oficina a travs de MyChart de [place] o por telfono llamando al [phone] y presione la opcin 4.

## 2021-09-14 NOTE — Progress Notes (Signed)
° °  Follow-Up Visit   Subjective  [name] is a 81 y.o. male who presents for the following: lesion (Check spots on right temple. Dur: few months. Pink, scaly. Itches at times). The patient has spots, moles and lesions to be evaluated, some may be new or changing and the patient has concerns that these could be cancer.  The following portions of the chart were reviewed this encounter and updated as appropriate:  Tobacco   Allergies   Meds   Problems   Med Hx   Surg Hx   Fam Hx      Review of Systems: No other skin or systemic complaints except as noted in HPI or Assessment and Plan.  Objective  Well appearing patient in no apparent distress; mood and affect are within normal limits.  A focused examination was performed including face, neck, hands. Relevant physical exam findings are noted in the Assessment and Plan.  Right Temple Open comedone  Right Temple x1 Erythematous thin papule with gritty scale.    Assessment & Plan  Open comedone Right Temple  Acne surgery. Lesion removed.   AK (actinic keratosis) Right Temple x1  Actinic keratoses are precancerous spots that appear secondary to cumulative UV radiation exposure/sun exposure over time. They are chronic with expected duration over 1 year. A portion of actinic keratoses will progress to squamous cell carcinoma of the skin. It is not possible to reliably predict which spots will progress to skin cancer and so treatment is recommended to prevent development of skin cancer.  Recommend daily broad spectrum sunscreen SPF 30+ to sun-exposed areas, reapply every 2 hours as needed.  Recommend staying in the shade or wearing long sleeves, sun glasses (UVA+UVB protection) and wide brim hats (4-inch brim around the entire circumference of the hat). Call for new or changing lesions.  Destruction of lesion - Right Temple x1 Complexity: simple   Destruction method: cryotherapy   Informed consent: discussed and consent obtained    Timeout:  patient name, date of birth, surgical site, and procedure verified Lesion destroyed using liquid nitrogen: Yes   Region frozen until ice ball extended beyond lesion: Yes   Outcome: patient tolerated procedure well with no complications   Post-procedure details: wound care instructions given    Sebaceous Hyperplasia - Small yellow papules with a central dell - Benign - Observe   Purpura - Chronic; persistent and recurrent.  Treatable, but not curable. - Violaceous macules and patches - Benign - Related to trauma, age, sun damage and/or use of blood thinners, chronic use of topical and/or oral steroids - Observe - Can use OTC arnica containing moisturizer such as Dermend Bruise Formula if desired - Call for worsening or other concerns   Actinic Damage - chronic, secondary to cumulative UV radiation exposure/sun exposure over time - diffuse scaly erythematous macules with underlying dyspigmentation - Recommend daily broad spectrum sunscreen SPF 30+ to sun-exposed areas, reapply every 2 hours as needed.  - Recommend staying in the shade or wearing long sleeves, sun glasses (UVA+UVB protection) and wide brim hats (4-inch brim around the entire circumference of the hat). - Call for new or changing lesions.  Return for TBSE As Scheduled.  I, Emelia Salisbury, CMA, am acting as scribe for Sarina Ser, MD. Documentation: I have reviewed the above documentation for accuracy and completeness, and I agree with the above.  Sarina Ser, MD

## 2021-09-15 ENCOUNTER — Encounter: Payer: Self-pay | Admitting: Dermatology

## 2021-09-16 ENCOUNTER — Ambulatory Visit: Payer: PPO | Admitting: Pulmonary Disease

## 2021-09-16 ENCOUNTER — Encounter: Payer: Self-pay | Admitting: Pulmonary Disease

## 2021-09-16 ENCOUNTER — Other Ambulatory Visit: Payer: Self-pay

## 2021-09-16 VITALS — BP 112/70 | HR 89 | Temp 97.7°F | Ht 72.0 in | Wt 221.2 lb

## 2021-09-16 DIAGNOSIS — I272 Pulmonary hypertension, unspecified: Secondary | ICD-10-CM

## 2021-09-16 DIAGNOSIS — G4733 Obstructive sleep apnea (adult) (pediatric): Secondary | ICD-10-CM

## 2021-09-16 DIAGNOSIS — J449 Chronic obstructive pulmonary disease, unspecified: Secondary | ICD-10-CM

## 2021-09-16 DIAGNOSIS — G4734 Idiopathic sleep related nonobstructive alveolar hypoventilation: Secondary | ICD-10-CM

## 2021-09-16 NOTE — Patient Instructions (Signed)
We are going to provide you with a CPAP machine that will do what we call a titration to know what pressures you need.  This machine will adjust itself.  This will be done at home.  We will then test on oxygen level with the CPAP machine alone with no oxygen being bled in.  This will let us know if you need oxygen added that to your CPAP.  You may use your DuoNeb (nebulizer) 3-4 times a day as needed for shortness of breath.  We will see you in follow-up in 3 months time call sooner should any new problems arise.

## 2021-09-16 NOTE — Progress Notes (Signed)
Subjective:    Patient ID: [name], male    DOB: 1941-08-15, 81 y.o.   MRN: [id number] Chief Complaint  Patient presents with   Follow-up    COPD    PATIENT PROFILE 81 year old male, former smoker quit 1990 (25-pack-year history).  Past medical history significant for COPD mixed type, covid in 2020, HTN, cardiomyopathy, CAD  Last seen by me in office on 05/12/2021.  And by Buelah Manis, NP on 06/11/2021  HPI Patient was last seen by Buelah Manis, NP on 11 June 2021.  He had an overnight oximetry that showed significant O2 desaturations as low as 63%.  A split-night sleep study was ordered.  He had this study on 06 August 2021.  This showed an AHI of 60/h.  This is consistent with severe sleep apnea.  He did had a titration study but an adequate in lab titration could not be done so the recommendation was for auto CPAP.  He has not been set up with this yet.  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  He has resolved to some degree however he continues to have issues with dyspnea on exertion.   PFTs obtained on 10 August show an FEV1 of 2.38 L or 75% predicted with minimal post bronchodilator response, FVC was 3.31 L or 74% predicted FEV1/FVC at 72%.  Lung volumes showed very mild reduction.  There is small airways reactivity/component and diffusion capacity mildly reduced by Kco.     Review of Systems A 10 point review of systems was performed and it is as noted above otherwise negative.  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   Anxiety 06/04/2019   Chronic right shoulder pain 09/10/2018   Cervical stenosis of spinal canal 06/05/2017   Pedal edema 06/05/2017   Health maintenance examination 12/06/2016   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/13/2016   Overweight (BMI 25.0-29.9) 07/04/2016   Low vitamin B12 level 01/01/2016   Exertional dyspnea 07/07/2015   Medicare annual wellness visit, subsequent 07/03/2015   Advanced care planning/counseling discussion 07/03/2015   Acute recurrent frontal sinusitis 05/05/2015   Prediabetes 05/04/2015   Pleuritic chest pain 05/04/2015   Ex-smoker 05/04/2015   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   Spermatocele 04/02/2013   Syncopal vertigo 11/04/2010   Lumbar disc disease with radiculopathy 11/04/2010   CAD, ARTERY BYPASS GRAFT 08/11/2009   PAD (peripheral artery disease) (HCC) 08/11/2009   DUPUYTREN'S CONTRACTURE, 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   Carotid stenosis 07/09/2008   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   Benign prostatic hyperplasia with urinary obstruction 09/06/2007   Essential hypertension 04/30/2007   GERD 04/30/2007   Hyperlipidemia 04/26/2007   OA (osteoarthritis) of knee 04/26/2007   DVT, HX OF 04/26/2007   Social History   Tobacco Use   Smoking status: Former    Packs/day: 1.00    Years: 25.00  Pack years: 25.00    Types: Cigarettes    Quit date: 08/15/1988    Years since quitting: 33.1   Smokeless tobacco: Never  Substance Use Topics   Alcohol use: No    Alcohol/week: 0.0 standard drinks   Allergies  Allergen Reactions   Vioxx [Rofecoxib] Other (See Comments)    Hemorrhage    Spiriva Respimat [Tiotropium Bromide Monohydrate] Other (See Comments)    Elevated bp   Contrast Media [Iodinated  Contrast Media] Itching and Rash    Delayed reaction post abdominal aortagram.    Morphine Nausea Only and Other (See Comments)    Irritability    Social History   Tobacco Use   Smoking status: Former    Packs/day: 1.00    Years: 25.00    Pack years: 25.00    Types: Cigarettes    Quit date: 08/15/1988    Years since quitting: 33.1   Smokeless tobacco: Never  Substance Use Topics   Alcohol use: No    Alcohol/week: 0.0 standard drinks   Current Meds  Medication Sig   acetaminophen (TYLENOL) 500 MG tablet Take 500 mg by mouth every 8 (eight) hours as needed.   aspirin EC 81 MG EC tablet Take 1 tablet (81 mg total) by mouth daily.   atorvastatin (LIPITOR) 20 MG tablet TAKE 1 TABLET BY MOUTH DAILY.   Cyanocobalamin (B-12) 1000 MCG SUBL Place 1 tablet under the tongue daily.   diclofenac Sodium (VOLTAREN) 1 % GEL Apply 2 g topically 4 (four) times daily.   docusate sodium (COLACE) 100 MG capsule Take 100 mg by mouth 2 (two) times daily as needed for mild constipation.   ezetimibe (ZETIA) 10 MG tablet TAKE 1 TABLET BY MOUTH ONCE DAILY.   fluticasone (FLONASE) 50 MCG/ACT nasal spray Place into both nostrils as needed for allergies or rhinitis.   furosemide (LASIX) 20 MG tablet Take 1 tablet (20 mg total) by mouth daily as needed. Restart taking this medication tomorrow 07/12/21   gabapentin (NEURONTIN) 600 MG tablet Take 600 mg by mouth 3 (three) times daily.   ipratropium-albuterol (DUONEB) 0.5-2.5 (3) MG/3ML SOLN Take 3 mLs by nebulization every 6 (six) hours as needed.   isosorbide mononitrate (IMDUR) 30 MG 24 hr tablet Take 1 tablet (30 mg total) by mouth daily.   losartan (COZAAR) 25 MG tablet Take 1 tablet (25 mg total) by mouth daily. Restart taking this medication tomorrow 07/12/21   Nebulizers (COMPRESSOR/NEBULIZER) MISC Use as directed.   nitroGLYCERIN (NITROSTAT) 0.4 MG SL tablet Place 1 tablet (0.4 mg total) under the tongue every 5 (five) minutes as needed for chest pain.    OXYGEN Inhale 2 L into the lungs at bedtime.   pantoprazole (PROTONIX) 40 MG tablet Take 40 mg by mouth daily.   polyethylene glycol (MIRALAX / GLYCOLAX) 17 g packet Take 17 g by mouth daily as needed for mild constipation.    PROAIR HFA 108 (90 Base) MCG/ACT inhaler TAKE 2 PUFFS INTO LUNGS EVERY 6 HOURS ASNEEDED FOR WHEEZING OR SHORTNESS OF BREATH   ranolazine (RANEXA) 500 MG 12 hr tablet Take 1 tablet (500 mg total) by mouth 2 (two) times daily.   tamsulosin (FLOMAX) 0.4 MG CAPS capsule Take 0.4 mg by mouth at bedtime.   tolterodine (DETROL LA) 4 MG 24 hr capsule TAKE 1 CAPSULE EVERY DAY   Immunization History  Administered Date(s) Administered   Fluad Quad(high Dose 65+) 04/17/2019   Influenza Whole 08/15/2000, 07/04/2007, 05/09/2008, 05/31/2010   Influenza, High Dose  Seasonal PF 05/15/2013, 06/25/2015, 07/05/2021   Influenza,inj,Quad PF,6+ Mos 04/17/2014, 05/18/2016, 06/07/2018   Influenza-Unspecified 05/22/2017, 05/28/2020   PFIZER(Purple Top)SARS-COV-2 Vaccination 09/07/2019, 09/28/2019, 05/12/2020, 01/19/2021   Pneumococcal Conjugate-13 02/09/2015   Pneumococcal Polysaccharide-23 08/15/2000, 10/29/2008   Td 08/15/2005   Tdap 02/10/2018   Zoster Recombinat (Shingrix) 04/05/2017, 07/25/2017   Zoster, Live 02/04/2009       Objective:   Physical Exam BP 112/70 (BP Location: Left Arm, Patient Position: Sitting, Cuff Size: Normal)   Pulse 89   Temp 97.7 F (36.5 C) (Oral)   Ht 6' (1.829 m)   Wt 221 lb 3.2 oz (100.3 kg)   SpO2 96%   BMI 30.00 kg/m  GENERAL: This is a well-developed, overweight gentleman who is awake, alert, no acute distress.   Ambulating with walker due to back issues and recently torn hamstring. HEAD: Normocephalic, atraumatic.  EYES: Pupils equal, round, reactive to light.  No scleral icterus.  MOUTH: Nose/mouth/throat not examined due to masking requirements for COVID 19. NECK: Supple. No thyromegaly. Trachea midline. No JVD.  No adenopathy. PULMONARY:  Good air entry bilaterally, no adventitious sounds.  CARDIOVASCULAR: S1 and S2. Regular rate and rhythm.  Grade 1/6 systolic ejection murmur left sternal border. GASTROINTESTINAL: Protuberant abdomen, soft, no tenderness.   MUSCULOSKELETAL: No joint deformity, no clubbing, no edema.  NEUROLOGIC: No overt focal deficits noted.  Speech is fluent.   SKIN: Intact,warm,dry.  Limited exam shows no rashes.  PSYCH: Mood and behavior normal.       Assessment & Plan:     ICD-10-CM   1. COPD, mild (HCC) mild/moderate  J44.9    No desaturations with activity Continue DuoNeb as needed 3-4 times a day    2. Pulmonary hypertension (HCC) - mild/moderate  I27.20    Will need echo rechecked around August This may be related to his OSA COPD is mild to moderate likely not culprit    3. OSA (obstructive sleep apnea) - severe AHI 59.7  G47.33 AMB REFERRAL FOR DME    Pulse oximetry, overnight   Cannot do in lab sleep titration Auto CPAP 5 to 20 cm H2O with download Overnight oximetry on CPAP with no O2    4. Nocturnal hypoxia  G47.34 Pulse oximetry, overnight   On oxygen currently at 2 L/min Compliant May be able to DC once on CPAP     Orders Placed This Encounter  Procedures   AMB REFERRAL FOR DME    Referral Priority:   Urgent    Referral Type:   Durable Medical Equipment Purchase    Number of Visits Requested:   1   Pulse oximetry, overnight    ON CPAP MACHINE , PLEASE MAKE SURE OXYGEN IS NOT ON DURING TEST.    Standing Status:   Future    Standing Expiration Date:   09/16/2022   Will see the patient in follow-up in 3 months time he is to contact us prior to that time should any new difficulties arise.  Gailen Shelter, MD Advanced Bronchoscopy PCCM [facility]    *This note was dictated using voice recognition software/Dragon.  Despite best efforts to proofread, errors can occur which can change the meaning. Any transcriptional errors that result from this process  are unintentional and may not be fully corrected at the time of dictation.

## 2021-09-20 ENCOUNTER — Ambulatory Visit: Payer: PPO | Admitting: Cardiovascular Disease

## 2021-09-23 DIAGNOSIS — R102 Pelvic and perineal pain: Secondary | ICD-10-CM | POA: Diagnosis not present

## 2021-09-23 DIAGNOSIS — S8011XS Contusion of right lower leg, sequela: Secondary | ICD-10-CM | POA: Diagnosis not present

## 2021-09-23 DIAGNOSIS — S76312D Strain of muscle, fascia and tendon of the posterior muscle group at thigh level, left thigh, subsequent encounter: Secondary | ICD-10-CM | POA: Diagnosis not present

## 2021-09-30 DIAGNOSIS — G4730 Sleep apnea, unspecified: Secondary | ICD-10-CM | POA: Diagnosis not present

## 2021-09-30 DIAGNOSIS — R0683 Snoring: Secondary | ICD-10-CM | POA: Diagnosis not present

## 2021-10-01 ENCOUNTER — Ambulatory Visit: Payer: PPO | Admitting: Urology

## 2021-10-01 ENCOUNTER — Encounter: Payer: Self-pay | Admitting: Urology

## 2021-10-01 ENCOUNTER — Other Ambulatory Visit: Payer: Self-pay

## 2021-10-01 ENCOUNTER — Telehealth: Payer: Self-pay | Admitting: *Deleted

## 2021-10-01 ENCOUNTER — Telehealth: Payer: Self-pay | Admitting: Urgent Care

## 2021-10-01 ENCOUNTER — Telehealth: Payer: Self-pay

## 2021-10-01 ENCOUNTER — Other Ambulatory Visit: Payer: Self-pay | Admitting: Urology

## 2021-10-01 VITALS — BP 125/69 | HR 97 | Ht 72.0 in | Wt 216.0 lb

## 2021-10-01 DIAGNOSIS — N401 Enlarged prostate with lower urinary tract symptoms: Secondary | ICD-10-CM | POA: Diagnosis not present

## 2021-10-01 DIAGNOSIS — D494 Neoplasm of unspecified behavior of bladder: Secondary | ICD-10-CM

## 2021-10-01 DIAGNOSIS — N138 Other obstructive and reflux uropathy: Secondary | ICD-10-CM | POA: Diagnosis not present

## 2021-10-01 DIAGNOSIS — N3289 Other specified disorders of bladder: Secondary | ICD-10-CM | POA: Diagnosis not present

## 2021-10-01 LAB — MICROSCOPIC EXAMINATION: Bacteria, UA: NONE SEEN

## 2021-10-01 LAB — URINALYSIS, COMPLETE
Bilirubin, UA: NEGATIVE
Glucose, UA: NEGATIVE
Ketones, UA: NEGATIVE
Leukocytes,UA: NEGATIVE
Nitrite, UA: NEGATIVE
Protein,UA: NEGATIVE
Specific Gravity, UA: 1.015 (ref 1.005–1.030)
Urobilinogen, Ur: 0.2 mg/dL (ref 0.2–1.0)
pH, UA: 7 (ref 5.0–7.5)

## 2021-10-01 NOTE — H&P (View-Only) (Signed)
° °  10/01/21  CC:  Chief Complaint  Patient presents with   Cysto    HPI: Refer to my prior note 09/03/2021.  Incidentally noted to have a 15 x 20 mm bladder mass near the right UO on pelvic MRI.  UA today 11-30 RBC  Blood pressure 125/69, pulse 97, height 6' (1.829 m), weight 216 lb (98 kg). NED. A&Ox3.   No respiratory distress   Abd soft, NT, ND Normal phallus with bilateral descended testicles  Cystoscopy Procedure Note  Patient identification was confirmed, informed consent was obtained, and patient was prepped using Betadine solution.  Lidocaine jelly was administered per urethral meatus.     Pre-Procedure: - Inspection reveals a normal caliber urethral meatus.  Procedure: The flexible cystoscope was introduced without difficulty - No urethral strictures/lesions are present. 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  The procedures was discussed in detail including potential risks of bleeding, infection and rarely bladder injury Recommend postresection gemcitabine All questions were answered and he desires to schedule   [name] Sons, MD

## 2021-10-01 NOTE — Telephone Encounter (Signed)
-----   Message from Karen Kitchens, NP sent at 10/01/2021  1:31 PM EST ----- Regarding: Request for pre-operative cardiac clearance Request for pre-operative cardiac clearance:  1. What type of surgery is being performed?  TRANSURETHRAL RESECTION OF BLADDER TUMOR (TURBT); CYSTOSCOPY WITH RETROGRADE PYELOGRAM (Bilateral); BLADDER INSTILLATION OF GEMCITABINE   2. When is this surgery scheduled?  10/12/2021  3. Type of clearance being requested (medical, pharmacy, both). BOTH   4. Are there any medications that need to be held prior to surgery? ASA  5. Practice name and name of physician performing surgery?  Performing surgeon: Dr. John Giovanni, MD Requesting clearance: Honor Loh, FNP-C    6. Anesthesia type (none, local, MAC, general)? GENERAL  7. What is the office phone and fax number?   Phone: [phone] Fax: [phone]  ATTENTION: Unable to create telephone message as per your standard workflow. Directed by [facility] providers to send requests for cardiac clearance to this pool for appropriate distribution to provider covering pre-operative clearances.   Honor Loh, MSN, APRN, FNP-C, CEN [facility]  Peri-operative Services Nurse Practitioner Phone: [phone] 10/01/21 1:31 PM

## 2021-10-01 NOTE — Progress Notes (Signed)
10/01/2021 9:21 AM   [name] [birth year] [id number]  Referring provider: Ria Bush, MD [place],  [place] [address]  Chief Complaint  Patient presents with   Cysto    HPI: 81 y.o. 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  Had a pelvic MRI ordered by orthopedics and incidentally noted to have a 15 x 20 mm right bladder wall mass near the right UO Stable lower urinary tract symptoms Had brief episode of hematuria a few months ago Prior cystoscopy several years ago for gross hematuria No flank, abdominal or pelvic pain Cystoscopy today with 2 cm papillary bladder tumor   PMH: Past Medical History:  Diagnosis Date   ([place]) heart failure with improved ejection fraction ([place])    a. 06/2019 Echo (in setting of COVID): EF 35-40%, mild LVH, g1 DD, glob HK; b. 09/2019 Echo: EF 50-55%, Gr1 DD; c. 03/2021 Echo: EF 50-55%, no rwma, mild LVH, GrI DD, nl RV fxn. RVSP 44.85mmHg. Mild-mod dil LA. Triv MR. Mild-mod Ao sclerosis.   Actinic keratosis    Anginal pain ([place])    BENIGN PROSTATIC HYPERTROPHY, WITH URINARY OBSTRUCTION 09/06/2007   CAD s/p CABG    a. 1990 s/p MI-->CABG x 2; b. 2002 s/p BMS to LCX; c. 06/2015 Cath: LM 50ost, LAD 100ost/p, 63m/d, D2 nl, LCX  patent stent, RCA 80p (small), LIMA->LAD nl, VG->D2 nl-->Med Rx; d. 05/2020 MV: fixed apical ant/apical defect. No signif ischemia; e. 04/2021 MV: EF 52%, no ischemia.   Carotid arterial disease ([place])    a. 08/2016 Carotid U/S: <39% bilat.   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   CKD (chronic kidney disease), stage III ([place])    Community acquired pneumonia of right lower lobe of lung 06/05/2017   COVID-19 virus infection 05/30/2019   Covid PNA with hospitalization 05/2019   DUPUYTREN'S CONTRACTURE, 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   DVT, HX OF 1998   Emphysema of lung ([place])    GERD 04/30/2007   Headache    hx migraines   History of hiatal hernia    History of shingles    HYPERLIPIDEMIA  04/26/2007   HYPERTENSION 04/30/2007   INGUINAL HERNIA, RIGHT 05/26/2010   Laceration of skin of left hand 03/08/2018   Lumbar disc disease with radiculopathy    neuropathy in feet   Myocardial infarction ([place])    1989, 2002   OSA (obstructive sleep apnea)    a. did not tolerate CPAP.   Osteoarthritis    PAD (peripheral artery disease) ([place] [address])    a. 07/2017 LE duplex: RSFA 75-60m, LSFA 75-14m, 50-74d; c. 08/2018 Periph Angio: No signif AoIliac dzs. Mod-sev Ca2+ RSFA w/ diff dzs throughout- 3 vessel runoff. Borderline signif LSFA dzs w/ mod-sev Ca2+ vessels and 3 vessel runoff below the knee-->Med rx.   Pneumonia 07/2014   [facility] hospitalization   Pneumonia due to COVID-19 virus 06/02/2019   Pre-diabetes    PSA, INCREASED 07/09/2008   Spinal stenosis of lumbar region     Surgical History: Past Surgical History:  Procedure Laterality Date   ABDOMINAL AORTOGRAM W/LOWER EXTREMITY [name] 09/12/2018   Procedure: ABDOMINAL AORTOGRAM W/LOWER EXTREMITY;  Surgeon: [name], MD;  Location: [facility];  Service: Cardiovascular;  Laterality: [name];   APPENDECTOMY     rupture   BACK SURGERY     1984 and then [facility]   CARDIAC CATHETERIZATION [name] 07/07/2015   Procedure: Left Heart Cath and Cors/Grafts Angiography;  Surgeon: [name], MD;  Location: [facility];  Service: Cardiovascular;  Laterality: [name];   CATARACT EXTRACTION, BILATERAL     CERVICAL SPINE SURGERY  12/2016   cervical stenosis [name])   COLONOSCOPY  03/2010   HP polyp, diverticulosis, rpt 10 yrs ([name])   CORONARY ARTERY BYPASS GRAFT  1990   3 vessel    CYSTOSCOPY  12/23/10   [facility]   EYE SURGERY     cataract   JOINT REPLACEMENT     KNEE ARTHROSCOPY Right    x2   LAMINOTOMY  1986   L5/S1 lumbar laminotomy for two ruptured discs/fusion   LUMBAR LAMINECTOMY/DECOMPRESSION MICRODISCECTOMY [name] 07/13/2016   Procedure: LUMBAR TWO-THREE, LUMBAR THREE-FOUR, LUMBAR FOUR-FIVE LAMINECTOMY AND  FORAMINOTOMY;  Surgeon: [name], MD;  Location: [facility];  Service: Neurosurgery;  Laterality: [name];  LAMINECTOMY AND FORAMINOTOMY L2-L3, L3-L4,L4-L5   LUMBAR SPINE SURGERY  06/2020   Bilateral redo laminectomy/laminotomy/foraminotomies/medial facetectomy to decompress the bilateral L4 and L5 nerve roots [name])   TOTAL HIP ARTHROPLASTY Bilateral [id number]   TOTAL KNEE ARTHROPLASTY Right 03/06/2017   Procedure: RIGHT TOTAL KNEE ARTHROPLASTY;  Surgeon: Gaynelle Arabian, MD;  Location: [facility];  Service: Orthopedics;  Laterality: Right;    Home Medications:  Allergies as of 10/01/2021       Reactions   Vioxx [rofecoxib] Other (See Comments)   Hemorrhage    Spiriva Respimat [tiotropium Bromide Monohydrate] Other (See Comments)   Elevated bp   Contrast Media [iodinated Contrast Media] Itching, Rash   Delayed reaction post abdominal aortagram.    Morphine Nausea Only, Other (See Comments)   Irritability        Medication List        Accurate as of October 01, 2021  9:21 AM. If you have any questions, ask your nurse or doctor.          acetaminophen 500 MG tablet Commonly known as: TYLENOL Take 500 mg by mouth every 8 (eight) hours as needed.   aspirin 81 MG EC tablet Take 1 tablet (81 mg total) by mouth daily.   atorvastatin 20 MG tablet Commonly known as: LIPITOR TAKE 1 TABLET BY MOUTH DAILY.   B-12 1000 MCG Subl Place 1 tablet under the tongue daily.   Compressor/Nebulizer Misc Use as directed.   diclofenac Sodium 1 % Gel Commonly known as: Voltaren Apply 2 g topically 4 (four) times daily.   docusate sodium 100 MG capsule Commonly known as: COLACE Take 100 mg by mouth 2 (two) times daily as needed for mild constipation.   ezetimibe 10 MG tablet Commonly known as: ZETIA TAKE 1 TABLET BY MOUTH ONCE DAILY.   fluticasone 50 MCG/ACT nasal spray Commonly known as: FLONASE Place into both nostrils as needed for allergies or rhinitis.   furosemide 20 MG  tablet Commonly known as: LASIX Take 1 tablet (20 mg total) by mouth daily as needed. Restart taking this medication tomorrow 07/12/21   gabapentin 600 MG tablet Commonly known as: NEURONTIN Take 600 mg by mouth 3 (three) times daily.   ipratropium-albuterol 0.5-2.5 (3) MG/3ML Soln Commonly known as: DUONEB Take 3 mLs by nebulization every 6 (six) hours as needed.   isosorbide mononitrate 30 MG 24 hr tablet Commonly known as: IMDUR Take 1 tablet (30 mg total) by mouth daily.   losartan 25 MG tablet Commonly known as: COZAAR Take 1 tablet (25 mg total) by mouth daily. Restart taking this medication tomorrow 07/12/21   nitroGLYCERIN 0.4 MG SL tablet Commonly known as: NITROSTAT Place 1 tablet (  0.4 mg total) under the tongue every 5 (five) minutes as needed for chest pain.   OXYGEN Inhale 2 L into the lungs at bedtime.   pantoprazole 40 MG tablet Commonly known as: PROTONIX Take 40 mg by mouth daily.   polyethylene glycol 17 g packet Commonly known as: MIRALAX / GLYCOLAX Take 17 g by mouth daily as needed for mild constipation.   ProAir HFA 108 (90 Base) MCG/ACT inhaler Generic drug: albuterol TAKE 2 PUFFS INTO LUNGS EVERY 6 HOURS ASNEEDED FOR WHEEZING OR SHORTNESS OF BREATH   ranolazine 500 MG 12 hr tablet Commonly known as: RANEXA Take 1 tablet (500 mg total) by mouth 2 (two) times daily.   tamsulosin 0.4 MG Caps capsule Commonly known as: FLOMAX Take 0.4 mg by mouth at bedtime.   tolterodine 4 MG 24 hr capsule Commonly known as: DETROL LA TAKE 1 CAPSULE EVERY DAY        Allergies:  Allergies  Allergen Reactions   Vioxx [Rofecoxib] Other (See Comments)    Hemorrhage    Spiriva Respimat [Tiotropium Bromide Monohydrate] Other (See Comments)    Elevated bp   Contrast Media [Iodinated Contrast Media] Itching and Rash    Delayed reaction post abdominal aortagram.    Morphine Nausea Only and Other (See Comments)    Irritability     Family History: Family  History  Problem Relation Age of Onset   Stroke Mother    Heart attack Mother    Diabetes Mother    Stroke Father    Heart disease Father    Kidney disease Father        PCKD   Cancer Sister        throat   Diabetes Brother    Kidney disease Sister        PCKD   Cancer Other        5/7 nephews with lung cancer    Social History:  reports that he quit smoking about 33 years ago. His smoking use included cigarettes. He has a 25.00 pack-year smoking history. He has never used smokeless tobacco. He reports that he does not drink alcohol and does not use drugs.   Physical Exam: BP 125/69    Pulse 97    Ht 6' (1.829 m)    Wt 216 lb (98 kg)    BMI 29.29 kg/m   Constitutional:  Alert and oriented, No acute distress. HEENT: [place] AT, moist mucus membranes.  Trachea midline, no masses. Cardiovascular: No clubbing, cyanosis, or edema. Respiratory: Normal respiratory effort, no increased work of breathing. Psychiatric: Normal mood and affect.   Assessment & Plan:    1. Bladder tumor Probable Ta urothelial carcinoma-low-grade Scheduled for TURBT-discussed on today's cystoscopy note   Abbie Sons, MD  [facility] [address] [place],  [address] [phone]

## 2021-10-01 NOTE — Progress Notes (Signed)
° °  10/01/21  CC:  Chief Complaint  Patient presents with   Cysto    HPI: Refer to my prior note 09/03/2021.  Incidentally noted to have a 15 x 20 mm bladder mass near the right UO on pelvic MRI.  UA today 11-30 RBC  Blood pressure 125/69, pulse 97, height 6' (1.829 m), weight 216 lb (98 kg). NED. A&Ox3.   No respiratory distress   Abd soft, NT, ND Normal phallus with bilateral descended testicles  Cystoscopy Procedure Note  Patient identification was confirmed, informed consent was obtained, and patient was prepped using Betadine solution.  Lidocaine jelly was administered per urethral meatus.     Pre-Procedure: - Inspection reveals a normal caliber urethral meatus.  Procedure: The flexible cystoscope was introduced without difficulty - No urethral strictures/lesions are present. -  Mild to moderate lateral lobe enlargement  prostate  - Normal bladder neck - Bilateral ureteral orifices identified - Bladder mucosa  reveals a 2 cm papillary tumor lateral to right UO - No bladder stones - No trabeculation  Retroflexion shows papillary tumor as described   Post-Procedure: - Patient tolerated the procedure well  Assessment/ Plan: Findings were discussed in detail with Mr. 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  The procedures was discussed in detail including potential risks of bleeding, infection and rarely bladder injury Recommend postresection gemcitabine All questions were answered and he desires to schedule   Abbie Sons, MD

## 2021-10-01 NOTE — Telephone Encounter (Signed)
81 year old male with history of CAD s/p CABG in 1990 with PCI to the LCx in 2002, ICM with EF 40% in 06/2019 improved to 55% in 09/2019, left lower extremity DVT, Covid, mild bilateral carotid artery disease, spinal stenosis s/p surgery, and OSA intolerant to CPAP  Last LHC 06/2015 with patent grafts x 2 and diffuse disease in a small RCA Lexiscan MPI 04/2021 without evidence of ischemia with an EF of 52%, low risk  Urology is requesting aspirin 81 mg be held prior to TURBT, cystoscopy, retrograde bilateral pyelogram and bladder instillation of gemcitabine.   Dr Rockey Situ, please provide recommendations on holding aspirin and route back to the preop pool.

## 2021-10-01 NOTE — Telephone Encounter (Signed)
Request for pre-operative cardiac clearance Received: Today Karen Kitchens, NP  P Cv Div Preop Callback Request for pre-operative cardiac clearance:     1. What type of surgery is being performed?  TRANSURETHRAL RESECTION OF BLADDER TUMOR (TURBT); CYSTOSCOPY WITH RETROGRADE PYELOGRAM (Bilateral); BLADDER INSTILLATION OF GEMCITABINE   2. When is this surgery scheduled?  10/12/2021     3. Type of clearance being requested (medical, pharmacy, both).  BOTH     4. Are there any medications that need to be held prior to surgery?  ASA   5. Practice name and name of physician performing surgery?  Performing surgeon: Dr. John Giovanni, MD  Requesting clearance: Honor Loh, FNP-C       6. Anesthesia type (none, local, MAC, general)?  GENERAL   7. What is the office phone and fax number?    Phone: [phone]  Fax: [phone]   ATTENTION: Unable to create telephone message as per your standard workflow. Directed by [facility] providers to send requests for cardiac clearance to this pool for appropriate distribution to provider covering pre-operative clearances.   Honor Loh, MSN, APRN, FNP-C, CEN  [facility]  Peri-operative Services Nurse Practitioner  Phone: [phone]  10/01/21 1:31 PM

## 2021-10-01 NOTE — Progress Notes (Signed)
Surgical Physician Order Form [facility] [place]  * Scheduling expectation : Next Available  *Length of Case: 30 minutes  *Clearance needed: Per Gaspar Bidding  *Anticoagulation Instructions: [name]  *Aspirin Instructions: Hold Aspirin  *Post-op visit Date/Instructions:  1-2 week with pathology review  *Diagnosis: Bladder Tumor  *Procedure:   TURBT (2-5 cm); possible bilateral retrograde pyelograms; instillation intravesical gemcitabine   Additional orders: Gemcitabine 2000mg  bladder instillation  -Admit type: OUTpatient  -Anesthesia: Choice  -VTE Prophylaxis Standing Order SCDs       Other:   -Standing Lab Orders Per Anesthesia    Lab other: UA&Urine Culture-ordered 10/01/2021  -Standing Test orders EKG/Chest x-ray per Anesthesia       Test other:   - Medications:  Ancef 2gm IV  -Other orders:  [name]

## 2021-10-01 NOTE — Telephone Encounter (Signed)
I spoke with [name]. We have discussed possible surgery dates and Tuesday February 28th, 2023 was agreed upon by all parties. Patient given information about surgery date, what to expect pre-operatively and post operatively.   We discussed that a Pre-Admission Testing office will be calling to set up the pre-op visit that will take place prior to surgery, and that these appointments are typically done over the phone with a Pre-Admissions RN.   Informed patient that our office will communicate any additional care to be provided after surgery. Patients questions or concerns were discussed during our call. Advised to call our office should there be any additional information, questions or concerns that arise. Patient verbalized understanding.

## 2021-10-01 NOTE — H&P (View-Only) (Signed)
10/01/2021 9:21 AM   [name] 26-Sep-1940 [id number]  Referring provider: Ria Bush, MD [place],  [place] [address]  Chief Complaint  Patient presents with   Cysto    HPI: 81 y.o. 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 06/2019 Echo (in setting of COVID): EF 35-40%, mild LVH, g1 DD, glob HK; b. 09/2019 Echo: EF 50-55%, Gr1 DD; c. 03/2021 Echo: EF 50-55%, no rwma, mild LVH, GrI DD, nl RV fxn. RVSP 44.24mmHg. Mild-mod dil LA. Triv MR. Mild-mod Ao sclerosis.   Actinic keratosis    Anginal pain ([place])    BENIGN PROSTATIC HYPERTROPHY, WITH URINARY OBSTRUCTION 09/06/2007   CAD s/p CABG    a. 1990 s/p MI-->CABG x 2; b. 2002 s/p BMS to LCX; c. 06/2015 Cath: LM 50ost, LAD 100ost/p, 12m/d, D2 nl, LCX  patent stent, RCA 80p (small), LIMA->LAD nl, VG->D2 nl-->Med Rx; d. 05/2020 MV: fixed apical ant/apical defect. No signif ischemia; e. 04/2021 MV: EF 52%, no ischemia.   Carotid arterial disease ([place])    a. 08/2016 Carotid U/S: <39% bilat.   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 did not tolerate CPAP.   Osteoarthritis    PAD (peripheral artery disease) ([place])    a. 07/2017 LE duplex: RSFA 75-7m, LSFA 75-7m, 50-74d; c. 08/2018 Periph Angio: No signif AoIliac dzs. Mod-sev Ca2+ RSFA w/ diff dzs throughout- 3 vessel runoff. Borderline signif LSFA dzs w/ mod-sev Ca2+ vessels and 3 vessel runoff below the knee-->Med rx.   Pneumonia 07/2014   [facility] hospitalization   Pneumonia due to COVID-19 virus 06/02/2019   Pre-diabetes    PSA, INCREASED 07/09/2008   Spinal stenosis of lumbar region     Surgical History: Past Surgical History:  Procedure Laterality Date   ABDOMINAL AORTOGRAM W/LOWER EXTREMITY [name] 09/12/2018   Procedure: ABDOMINAL AORTOGRAM W/LOWER EXTREMITY;  Surgeon: [name], MD;  Location: [facility];  Service: Cardiovascular;  Laterality: [name];   APPENDECTOMY     rupture   BACK SURGERY     1984 [facility]   CARDIAC CATHETERIZATION [name] 07/07/2015   Procedure: Left Heart Cath and Cors/Grafts Angiography;  Surgeon: [name], MD;  Location: [facility];  Service: Cardiovascular;  Laterality: [name];   CATARACT EXTRACTION, BILATERAL     CERVICAL SPINE SURGERY  12/2016   cervical stenosis [name])   COLONOSCOPY  03/2010   HP polyp, diverticulosis, rpt 10 yrs ([name])   CORONARY ARTERY BYPASS GRAFT  1990   3 vessel    CYSTOSCOPY  12/23/10   [facility]   EYE SURGERY     cataract   JOINT REPLACEMENT     KNEE ARTHROSCOPY Right    x2   LAMINOTOMY  1986   L5/S1 lumbar laminotomy for two ruptured discs/fusion   LUMBAR LAMINECTOMY/DECOMPRESSION MICRODISCECTOMY [name] 07/13/2016   Procedure: LUMBAR TWO-THREE, LUMBAR THREE-FOUR, LUMBAR FOUR-FIVE LAMINECTOMY AND  FORAMINOTOMY;  Surgeon: [name], MD;  Location: [facility];  Service: Neurosurgery;  Laterality: [name];  LAMINECTOMY AND FORAMINOTOMY L2-L3, L3-L4,L4-L5   LUMBAR SPINE SURGERY  06/2020   Bilateral redo laminectomy/laminotomy/foraminotomies/medial facetectomy to decompress the bilateral L4 and L5 nerve roots [name])   TOTAL HIP ARTHROPLASTY Bilateral 2536,6440   TOTAL KNEE ARTHROPLASTY Right 03/06/2017   Procedure: RIGHT TOTAL KNEE ARTHROPLASTY;  Surgeon: Gaynelle Arabian, MD;  Location: [facility];  Service: Orthopedics;  Laterality: Right;    Home Medications:  Allergies as of 10/01/2021       Reactions   Vioxx [rofecoxib] Other (See Comments)   Hemorrhage    Spiriva Respimat [tiotropium Bromide Monohydrate] Other (See Comments)   Elevated bp   Contrast Media [iodinated Contrast Media] Itching, Rash   Delayed reaction post abdominal aortagram.    Morphine Nausea Only, Other (See Comments)   Irritability        Medication List        Accurate as of October 01, 2021  9:21 AM. If you have any questions, ask your nurse or doctor.          acetaminophen 500 MG tablet Commonly known as: TYLENOL Take 500 mg by mouth every 8 (eight) hours as needed.   aspirin 81 MG EC tablet Take 1 tablet (81 mg total) by mouth daily.   atorvastatin 20 MG tablet Commonly known as: LIPITOR TAKE 1 TABLET BY MOUTH DAILY.   B-12 1000 MCG Subl Place 1 tablet under the tongue daily.   Compressor/Nebulizer Misc Use as directed.   diclofenac Sodium 1 % Gel Commonly known as: Voltaren Apply 2 g topically 4 (four) times daily.   docusate sodium 100 MG capsule Commonly known as: COLACE Take 100 mg by mouth 2 (two) times daily as needed for mild constipation.   ezetimibe 10 MG tablet Commonly known as: ZETIA TAKE 1 TABLET BY MOUTH ONCE DAILY.   fluticasone 50 MCG/ACT nasal spray Commonly known as: FLONASE Place into both nostrils as needed for allergies or rhinitis.   furosemide 20 MG  tablet Commonly known as: LASIX Take 1 tablet (20 mg total) by mouth daily as needed. Restart taking this medication tomorrow 07/12/21   gabapentin 600 MG tablet Commonly known as: NEURONTIN Take 600 mg by mouth 3 (three) times daily.   ipratropium-albuterol 0.5-2.5 (3) MG/3ML Soln Commonly known as: DUONEB Take 3 mLs by nebulization every 6 (six) hours as needed.   isosorbide mononitrate 30 MG 24 hr tablet Commonly known as: IMDUR Take 1 tablet (30 mg total) by mouth daily.   losartan 25 MG tablet Commonly known as: COZAAR Take 1 tablet (25 mg total) by mouth daily. Restart taking this medication tomorrow 07/12/21   nitroGLYCERIN 0.4 MG SL tablet Commonly known as: NITROSTAT Place 1 tablet (  0.4 mg total) under the tongue every 5 (five) minutes as needed for chest pain.   OXYGEN Inhale 2 L into the lungs at bedtime.   pantoprazole 40 MG tablet Commonly known as: PROTONIX Take 40 mg by mouth daily.   polyethylene glycol 17 g packet Commonly known as: MIRALAX / GLYCOLAX Take 17 g by mouth daily as needed for mild constipation.   ProAir HFA 108 (90 Base) MCG/ACT inhaler Generic drug: albuterol TAKE 2 PUFFS INTO LUNGS EVERY 6 HOURS ASNEEDED FOR WHEEZING OR SHORTNESS OF BREATH   ranolazine 500 MG 12 hr tablet Commonly known as: RANEXA Take 1 tablet (500 mg total) by mouth 2 (two) times daily.   tamsulosin 0.4 MG Caps capsule Commonly known as: FLOMAX Take 0.4 mg by mouth at bedtime.   tolterodine 4 MG 24 hr capsule Commonly known as: DETROL LA TAKE 1 CAPSULE EVERY DAY        Allergies:  Allergies  Allergen Reactions   Vioxx [Rofecoxib] Other (See Comments)    Hemorrhage    Spiriva Respimat [Tiotropium Bromide Monohydrate] Other (See Comments)    Elevated bp   Contrast Media [Iodinated Contrast Media] Itching and Rash    Delayed reaction post abdominal aortagram.    Morphine Nausea Only and Other (See Comments)    Irritability     Family History: Family  History  Problem Relation Age of Onset   Stroke Mother    Heart attack Mother    Diabetes Mother    Stroke Father    Heart disease Father    Kidney disease Father        PCKD   Cancer Sister        throat   Diabetes Brother    Kidney disease Sister        PCKD   Cancer Other        5/7 nephews with lung cancer    Social History:  reports that he quit smoking about 33 years ago. His smoking use included cigarettes. He has a 25.00 pack-year smoking history. He has never used smokeless tobacco. He reports that he does not drink alcohol and does not use drugs.   Physical Exam: BP 125/69    Pulse 97    Ht 6' (1.829 m)    Wt 216 lb (98 kg)    BMI 29.29 kg/m   Constitutional:  Alert and oriented, No acute distress. HEENT: [place] AT, moist mucus membranes.  Trachea midline, no masses. Cardiovascular: No clubbing, cyanosis, or edema. Respiratory: Normal respiratory effort, no increased work of breathing. Psychiatric: Normal mood and affect.   Assessment & Plan:    1. 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

## 2021-10-01 NOTE — Progress Notes (Signed)
[facility] Posting Form   Surgery Date/Time: Date: 10/12/2021  Surgeon: Dr. John Giovanni, MD  Surgery Location: Day Surgery  Inpt ( No  )   Outpt (Yes)   Obs ( No  )   Diagnosis: D49.4 Bladder Tumor  -CPT: 90379, 55831 and possible 74420  Surgery: Transurethral Resection of Bladder Tumor with Intravesical Instillation of Gemcitabine and possible bilateral retrograde pyelograms   Stop Anticoagulations: No, hold ASA   Cardiac/Medical/Pulmonary Clearance needed: Yes  Clearance needed from Dr: Rockey Situ, initiated by Pre-Admit Provider Honor Loh NP  Clearance request sent on: Date: 10/01/21   *Orders entered into EPIC  Date: 10/01/21   *Case booked in EPIC  Date: 10/01/21  *Notified pt of Surgery: Date: 10/01/21  PRE-OP UA & CX: Yes, obtained on 10/01/2021  *Placed into Prior Authorization Work Fabio Bering Date: 10/01/21   Assistant/laser/rep:No

## 2021-10-01 NOTE — Progress Notes (Signed)
°  Perioperative Services Pre-Admission/Anesthesia Testing     Date: 10/01/21  Name: [name] MRN:   [id number]  Re: Request from surgery for clearance prior to scheduled procedure  Patient is scheduled to undergo a TRANSURETHRAL RESECTION OF BLADDER TUMOR (TURBT); CYSTOSCOPY WITH RETROGRADE PYELOGRAM (Bilateral); BLADDER INSTILLATION OF GEMCITABINE on 10/12/2021 with Dr. John Giovanni, MD. Patient has not been scheduled for his PAT appointment at this point, thus has not undergone review by PAT RN and/or APP. Received communication from primary attending surgeon's office requesting that patient be submitted for clearance from cardiology.   PROVIDER SPECIALTY FAXED TO   Ida Rogue, MD  Cardiology Sent to APP pre-op pool   Plan:  Clearance request generated and sent to [facility] APP pre-operative pool as noted above. Note will be updated to reflect communication with provider's office as it relates to clearance being provided and/or the need for office visit prior to clearance for surgery being issued.   Honor Loh, MSN, APRN, FNP-C, CEN [facility]  Peri-operative Services Nurse Practitioner Phone: [phone] 10/01/21 1:28 PM  NOTE: This note has been prepared using Dragon dictation software. Despite my best ability to proofread, there is always the potential that unintentional transcriptional errors may still occur from this process.

## 2021-10-04 NOTE — Telephone Encounter (Signed)
° °  Name: [name]  DOB: 1940/09/03  MRN: [id number]   Primary Cardiologist: Ida Rogue, MD  Chart reviewed as part of pre-operative protocol coverage. Patient was contacted 10/04/2021 in reference to pre-operative risk assessment for pending surgery as outlined below.  [name] was last seen on 08/02/21 by Dr. Rockey Situ.  Since that day, [name] has done well.  He can complete 4.0 METS without angina. He does have occasional chest pain, but this has been his baseline. He had a nonischemic nuclear stress test in Sept 2022.   Given his known disease, we reached out to Dr. Rockey Situ for guidance on holding ASA: Known coronary artery disease, CABG, prior stenting  Holding aspirin for upcoming procedure can certainly to be done but with a moderate risk given the above.  We would prefer he stay on aspirin but not sure if surgery can be done in this setting.  If hold on aspirin needed to complete procedure, will defer to urology length of time needed on aspirin hold  Certainly up to the patient whether he is willing to take this risk    Based on ACC/AHA guidelines, the patient would be at acceptable risk for the planned procedure without further cardiovascular testing.   The patient was advised that if he develops new symptoms prior to surgery to contact our office to arrange for a follow-up visit, and he verbalized understanding.  I will route this recommendation to the requesting party via Epic fax function and remove from pre-op pool. Please call with questions.  Tami Lin Emika Tiano, PA 10/04/2021, 10:01 AM

## 2021-10-05 ENCOUNTER — Encounter
Admission: RE | Admit: 2021-10-05 | Discharge: 2021-10-05 | Disposition: A | Discharge status: Home / Self Care | Payer: PPO | Source: Ambulatory Visit | Attending: Urology | Admitting: Urology

## 2021-10-05 ENCOUNTER — Other Ambulatory Visit: Payer: Self-pay

## 2021-10-05 ENCOUNTER — Inpatient Hospital Stay
Admission: RE | Admit: 2021-10-05 | Discharge: 2021-10-05 | Disposition: A | Discharge status: Home / Self Care | Payer: PPO | Source: Ambulatory Visit

## 2021-10-05 VITALS — Ht 72.0 in | Wt 218.0 lb

## 2021-10-05 DIAGNOSIS — I129 Hypertensive chronic kidney disease with stage 1 through stage 4 chronic kidney disease, or unspecified chronic kidney disease: Secondary | ICD-10-CM | POA: Insufficient documentation

## 2021-10-05 DIAGNOSIS — I739 Peripheral vascular disease, unspecified: Secondary | ICD-10-CM | POA: Diagnosis not present

## 2021-10-05 DIAGNOSIS — I10 Essential (primary) hypertension: Secondary | ICD-10-CM

## 2021-10-05 DIAGNOSIS — N1831 Chronic kidney disease, stage 3a: Secondary | ICD-10-CM | POA: Diagnosis not present

## 2021-10-05 DIAGNOSIS — I509 Heart failure, unspecified: Secondary | ICD-10-CM | POA: Diagnosis not present

## 2021-10-05 DIAGNOSIS — Z01812 Encounter for preprocedural laboratory examination: Secondary | ICD-10-CM | POA: Diagnosis not present

## 2021-10-05 DIAGNOSIS — Z7951 Long term (current) use of inhaled steroids: Secondary | ICD-10-CM | POA: Diagnosis not present

## 2021-10-05 DIAGNOSIS — D692 Other nonthrombocytopenic purpura: Secondary | ICD-10-CM | POA: Diagnosis not present

## 2021-10-05 DIAGNOSIS — Z7982 Long term (current) use of aspirin: Secondary | ICD-10-CM | POA: Diagnosis not present

## 2021-10-05 DIAGNOSIS — G629 Polyneuropathy, unspecified: Secondary | ICD-10-CM | POA: Diagnosis not present

## 2021-10-05 DIAGNOSIS — J449 Chronic obstructive pulmonary disease, unspecified: Secondary | ICD-10-CM | POA: Diagnosis not present

## 2021-10-05 DIAGNOSIS — N183 Chronic kidney disease, stage 3 unspecified: Secondary | ICD-10-CM | POA: Diagnosis not present

## 2021-10-05 DIAGNOSIS — Z6829 Body mass index (BMI) 29.0-29.9, adult: Secondary | ICD-10-CM | POA: Diagnosis not present

## 2021-10-05 DIAGNOSIS — I25708 Atherosclerosis of coronary artery bypass graft(s), unspecified, with other forms of angina pectoris: Secondary | ICD-10-CM | POA: Diagnosis not present

## 2021-10-05 HISTORY — DX: Chronic prostatitis: N41.1

## 2021-10-05 HISTORY — DX: Abdominal aortic aneurysm, without rupture, unspecified: I71.40

## 2021-10-05 LAB — POTASSIUM: Potassium: 4.1 mmol/L (ref 3.5–5.1)

## 2021-10-05 NOTE — Patient Instructions (Signed)
Your procedure is scheduled on: Tuesday, February 28 Report to the Registration Desk on the 1st floor of the [facility]. To find out your arrival time, please call [phone] between 1PM - 3PM on: Monday, February 27  REMEMBER: Instructions that are not followed completely may result in serious medical risk, up to and including death; or upon the discretion of your surgeon and anesthesiologist your surgery may need to be rescheduled.  Do not eat or drink after midnight the night before surgery.  No gum chewing, lozengers or hard candies.  TAKE THESE MEDICATIONS THE MORNING OF SURGERY WITH A SIP OF WATER:  Gabapentin Duoneb nebulizer Isosorbide Pantoprazole - (take one the night before and one on the morning of surgery - helps to prevent nausea after surgery.) Ranolazine (Ranexa)  Bring your proair inhaler to the hospital with you and use prior to surgery.  One week prior to surgery: starting February 21 Stop Anti-inflammatories (NSAIDS) such as Advil, Aleve, Ibuprofen, Motrin, Naproxen, Naprosyn and Aspirin based products such as Excedrin, Goodys Powder, BC Powder. Stop ANY OVER THE COUNTER supplements until after surgery. You may however, continue to take Tylenol if needed for pain up until the day of surgery. According to Dr. Bernardo Heater, you do not need to stop taking the aspirin.  No Alcohol for 24 hours before or after surgery.  No Smoking including e-cigarettes for 24 hours prior to surgery.  No chewable tobacco products for at least 6 hours prior to surgery.  No nicotine patches on the day of surgery.  Do not use any "recreational" drugs for at least a week prior to your surgery.  Please be advised that the combination of cocaine and anesthesia may have negative outcomes, up to and including death. If you test positive for cocaine, your surgery will be cancelled.  On the morning of surgery brush your teeth with toothpaste and water, you may rinse your mouth with mouthwash  if you wish. Do not swallow any toothpaste or mouthwash.  Do not wear jewelry.  Do not wear lotions, powders, or perfumes.   Do not shave body from the neck down 48 hours prior to surgery just in case you cut yourself which could leave a site for infection.   Contact lenses, hearing aids and dentures may not be worn into surgery.  Do not bring valuables to the hospital. [facility] is not responsible for any missing/lost belongings or valuables.   Notify your doctor if there is any change in your medical condition (cold, fever, infection).  Wear comfortable clothing (specific to your surgery type) to the hospital.  After surgery, you can help prevent lung complications by doing breathing exercises.  Take deep breaths and cough every 1-2 hours. Your doctor may order a device called an Incentive Spirometer to help you take deep breaths.  If you are being discharged the day of surgery, you will not be allowed to drive home. You will need a responsible adult (18 years or older) to drive you home and stay with you that night.   If you are taking public transportation, you will need to have a responsible adult (18 years or older) with you. Please confirm with your physician that it is acceptable to use public transportation.   Please call the [facility] Dept. at [phone] if you have any questions about these instructions.  Surgery Visitation Policy:  Patients undergoing a surgery or procedure may have one family member or support person with them as long as that person  is not COVID-19 positive or experiencing its symptoms.  That person may remain in the waiting area during the procedure and may rotate out with other people.

## 2021-10-05 NOTE — Patient Instructions (Signed)
Your procedure is scheduled on: Tuesday, February 28 Report to the Registration Desk on the 1st floor of the [facility]. To find out your arrival time, please call [phone] between 1PM - 3PM on: Monday, February 27  REMEMBER: Instructions that are not followed completely may result in serious medical risk, up to and including death; or upon the discretion of your surgeon and anesthesiologist your surgery may need to be rescheduled.  Do not eat or drink after midnight the night before surgery.  No gum chewing, lozengers or hard candies.  TAKE THESE MEDICATIONS THE MORNING OF SURGERY WITH A SIP OF WATER:  Atorvastatin Ezetimibe Gabapentin Duoneb nebulizer Isosorbide Pantoprazole - (take one the night before and one on the morning of surgery - helps to prevent nausea after surgery.) Ranolazine  Bring your proair inhaler to the hospital to use prior to surgery.  Follow recommendations from Cardiologist, Pulmonologist or PCP regarding stopping Aspirin.  One week prior to surgery: starting February 21 Stop Anti-inflammatories (NSAIDS) such as Advil, Aleve, Ibuprofen, Motrin, Naproxen, Naprosyn and Aspirin based products such as Excedrin, Goodys Powder, BC Powder. Stop ANY OVER THE COUNTER supplements until after surgery. You may however, continue to take Tylenol if needed for pain up until the day of surgery.  No Alcohol for 24 hours before or after surgery.  No Smoking including e-cigarettes for 24 hours prior to surgery.  No chewable tobacco products for at least 6 hours prior to surgery.  No nicotine patches on the day of surgery.  Do not use any "recreational" drugs for at least a week prior to your surgery.  Please be advised that the combination of cocaine and anesthesia may have negative outcomes, up to and including death. If you test positive for cocaine, your surgery will be cancelled.  On the morning of surgery brush your teeth with toothpaste and water, you may  rinse your mouth with mouthwash if you wish. Do not swallow any toothpaste or mouthwash.  Use CHG Soap or wipes as directed on instruction sheet.  Do not wear jewelry, make-up, hairpins, clips or nail polish.  Do not wear lotions, powders, or perfumes.   Do not shave body from the neck down 48 hours prior to surgery just in case you cut yourself which could leave a site for infection.  Also, freshly shaved skin may become irritated if using the CHG soap.  Contact lenses, hearing aids and dentures may not be worn into surgery.  Do not bring valuables to the hospital. [facility] is not responsible for any missing/lost belongings or valuables.   Bring your C-PAP to the hospital with you in case you may have to spend the night.   Notify your doctor if there is any change in your medical condition (cold, fever, infection).  Wear comfortable clothing (specific to your surgery type) to the hospital.  After surgery, you can help prevent lung complications by doing breathing exercises.  Take deep breaths and cough every 1-2 hours. Your doctor may order a device called an Incentive Spirometer to help you take deep breaths.  If you are being discharged the day of surgery, you will not be allowed to drive home. You will need a responsible adult (18 years or older) to drive you home and stay with you that night.   If you are taking public transportation, you will need to have a responsible adult (18 years or older) with you. Please confirm with your physician that it is acceptable to use public transportation.  Please call the [facility] Dept. at [phone] if you have any questions about these instructions.  Surgery Visitation Policy:  Patients undergoing a surgery or procedure may have one family member or support person with them as long as that person is not COVID-19 positive or experiencing its symptoms.  That person may remain in the waiting area during the procedure  and may rotate out with other people.

## 2021-10-07 ENCOUNTER — Ambulatory Visit
Admission: EM | Admit: 2021-10-07 | Discharge: 2021-10-07 | Disposition: A | Discharge status: Home / Self Care | Payer: PPO | Attending: Emergency Medicine | Admitting: Emergency Medicine

## 2021-10-07 ENCOUNTER — Encounter: Payer: Self-pay | Admitting: Emergency Medicine

## 2021-10-07 ENCOUNTER — Encounter: Payer: Self-pay | Admitting: Family Medicine

## 2021-10-07 DIAGNOSIS — S61225A Laceration with foreign body of left ring finger without damage to nail, initial encounter: Secondary | ICD-10-CM | POA: Diagnosis not present

## 2021-10-07 MED ORDER — CEPHALEXIN 500 MG PO CAPS: 500.0000 mg | ORAL_CAPSULE | Freq: Two times a day (BID) | ORAL | 0 refills | Status: AC

## 2021-10-07 NOTE — Discharge Instructions (Signed)
Take Keflex twice daily for the next seven days

## 2021-10-07 NOTE — ED Triage Notes (Signed)
Pt here with left ring finger laceration today with bandsaw. Bleeding controlled.

## 2021-10-07 NOTE — ED Provider Notes (Signed)
[facility]  ____________________________________________  Time seen: Approximately 1:51 PM  I have reviewed the triage vital signs and the nursing notes.   HISTORY  Chief Complaint Laceration   Historian Patient    HPI [name] is a 81 y.o. male presents to the urgent care with a linear well approximated dorsal left ring finger laceration sustained in accident from a band saw.  Patient's tetanus status is up-to-date.  No numbness or tingling in the left fourth digit.   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 06/2019 Echo (in setting of COVID): EF 35-40%, mild LVH, g1 DD, glob HK; b. 09/2019 Echo: EF 50-55%, Gr1 DD; c. 03/2021 Echo: EF 50-55%, no rwma, mild LVH, GrI DD, nl RV fxn. RVSP 44.24mmHg. Mild-mod dil LA. Triv MR. Mild-mod Ao sclerosis.   AAA (abdominal aortic aneurysm)    Actinic keratosis    Anginal pain ([place])    BENIGN PROSTATIC HYPERTROPHY, WITH URINARY OBSTRUCTION 09/06/2007   CAD s/p CABG    a. 1990 s/p MI-->CABG x 2; b. 2002 s/p BMS to LCX; c. 06/2015 Cath: LM 50ost, LAD 100ost/p, 4m/d, D2 nl, LCX  patent stent, RCA 80p (small), LIMA->LAD nl, VG->D2 nl-->Med Rx; d. 05/2020 MV: fixed apical ant/apical defect. No signif ischemia; e. 04/2021 MV: EF 52%, no ischemia.   Carotid arterial disease ([place])    a. 08/2016 Carotid U/S: <39% bilat.   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 did not tolerate CPAP.   Osteoarthritis    PAD (peripheral artery disease) ([place])    a. 07/2017 LE duplex: RSFA 75-81m, LSFA 75-70m, 50-74d; c. 08/2018 Periph Angio: No signif AoIliac dzs. Mod-sev Ca2+ RSFA w/ diff dzs throughout- 3 vessel runoff. Borderline signif LSFA dzs w/ mod-sev Ca2+ vessels and 3 vessel runoff below the knee-->Med rx.   Pneumonia 07/2014   [facility] hospitalization   Pneumonia due to COVID-19 virus 06/02/2019   Pre-diabetes    Prostatitis, chronic    PSA, INCREASED 07/09/2008   Spinal stenosis of lumbar region      Immunizations up to date:  Yes.     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 06/2019 Echo (in setting of COVID): EF 35-40%, mild LVH, g1 DD, glob HK; b. 09/2019 Echo: EF 50-55%, Gr1 DD; c. 03/2021 Echo: EF 50-55%, no rwma, mild LVH, GrI DD, nl RV fxn. RVSP 44.68mmHg. Mild-mod dil LA. Triv MR. Mild-mod Ao sclerosis.   AAA (abdominal aortic aneurysm)    Actinic keratosis    Anginal pain (HCC)    BENIGN  PROSTATIC HYPERTROPHY, WITH URINARY OBSTRUCTION 09/06/2007   CAD s/p CABG    a. 1990 s/p MI-->CABG x 2; b. 2002 s/p BMS to LCX; c. 06/2015 Cath: LM 50ost, LAD 100ost/p, 49m/d, D2 nl, LCX  patent stent, RCA 80p (small), LIMA->LAD nl, VG->D2 nl-->Med Rx; d. 05/2020 MV: fixed apical ant/apical defect. No signif ischemia; e. 04/2021 MV: EF 52%, no ischemia.   Carotid arterial disease ([place])    a. 08/2016 Carotid U/S: <39% bilat.   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   CKD (chronic kidney disease), stage III ([place])    Community acquired pneumonia of right lower lobe of lung 06/05/2017   COVID-19 virus infection 05/30/2019   Covid PNA with hospitalization 05/2019   DUPUYTREN'S  CONTRACTURE, 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   DVT, HX OF 1998   Emphysema of lung ([place])    GERD 04/30/2007   Headache    hx migraines   History of hiatal hernia    History of shingles    HYPERLIPIDEMIA 04/26/2007   HYPERTENSION 04/30/2007   INGUINAL HERNIA, RIGHT 05/26/2010   Laceration of skin of left hand 03/08/2018   Lumbar disc disease with radiculopathy    neuropathy in feet   Myocardial infarction ([place])    1989, 2002   OSA (obstructive sleep apnea)    a. did not tolerate CPAP.   Osteoarthritis    PAD (peripheral artery disease) ([place])    a. 07/2017 LE duplex: RSFA 75-78m, LSFA 75-80m, 50-74d; c. 08/2018 Periph Angio: No signif AoIliac dzs. Mod-sev Ca2+ RSFA w/ diff dzs throughout- 3 vessel runoff. Borderline signif LSFA dzs w/ mod-sev Ca2+ vessels and 3 vessel runoff below the knee-->Med rx.   Pneumonia 07/2014   [facility] hospitalization   Pneumonia due to COVID-19 virus 06/02/2019   Pre-diabetes    Prostatitis, chronic    PSA, INCREASED 07/09/2008   Spinal stenosis of lumbar region     Patient Active Problem List   Diagnosis Date Noted   Acute-on-chronic kidney injury ([place]) 07/09/2021   OSA (obstructive sleep apnea)    Acute on chronic diastolic CHF (congestive heart failure) ([place])    Right leg pain 06/22/2021   Nocturnal hypoxia 06/11/2021   Coronary artery disease involving native coronary artery of native heart    CKD (chronic kidney disease) stage 3, GFR 30-59 ml/min ([place]) 02/24/2021   Peripheral neuropathy 02/23/2021   Spondylolisthesis of lumbar region 07/07/2020   COPD (chronic obstructive pulmonary disease) ([place]) 01/14/2020   Coccydynia 09/20/2019   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   Anxiety 06/04/2019   Chronic right shoulder pain 09/10/2018   Cervical stenosis of spinal canal 06/05/2017   Pedal edema 06/05/2017   Health maintenance examination 12/06/2016   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/13/2016   Overweight (BMI 25.0-29.9)  07/04/2016   Low vitamin B12 level 01/01/2016   Exertional dyspnea 07/07/2015   Medicare annual wellness visit, subsequent 07/03/2015   Advanced care planning/counseling discussion 07/03/2015   Acute recurrent frontal sinusitis 05/05/2015   Prediabetes 05/04/2015   Pleuritic chest pain 05/04/2015   Ex-smoker 05/04/2015   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   Spermatocele 04/02/2013   Syncopal vertigo 11/04/2010   Lumbar disc disease with radiculopathy 11/04/2010   CAD, ARTERY BYPASS GRAFT 08/11/2009   PAD (peripheral artery disease) ([place]) 08/11/2009   DUPUYTREN'S CONTRACTURE, 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   Carotid stenosis 07/09/2008   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   Benign prostatic hyperplasia with urinary obstruction 09/06/2007   Essential hypertension 04/30/2007  GERD 04/30/2007   Hyperlipidemia 04/26/2007   OA (osteoarthritis) of knee 04/26/2007   DVT, HX OF 04/26/2007    Past Surgical History:  Procedure Laterality Date   ABDOMINAL AORTOGRAM W/LOWER EXTREMITY [name] 09/12/2018   Procedure: ABDOMINAL AORTOGRAM W/LOWER EXTREMITY;  Surgeon: [name], MD;  Location: [facility];  Service: Cardiovascular;  Laterality: [name];   APPENDECTOMY     rupture   BACK SURGERY     1984 and then [facility]   CARDIAC CATHETERIZATION [name] 07/07/2015   Procedure: Left Heart Cath and Cors/Grafts Angiography;  Surgeon: [name], MD;  Location: [facility];  Service: Cardiovascular;  Laterality: [name];   CATARACT EXTRACTION, BILATERAL  2009   CERVICAL SPINE SURGERY  12/2016   cervical stenosis [name])   COLONOSCOPY  03/2010   HP polyp, diverticulosis, rpt 10 yrs ([name])   CORONARY ANGIOPLASTY  11/30/2000   LCX   CORONARY ARTERY BYPASS GRAFT  1990   3 vessel    CYSTOSCOPY  12/23/2010   [facility]   EYE SURGERY     cataract   JOINT REPLACEMENT     KNEE ARTHROSCOPY Right    x2   LAMINOTOMY  1986   L5/S1 lumbar laminotomy for two ruptured discs/fusion    LUMBAR LAMINECTOMY/DECOMPRESSION MICRODISCECTOMY [name] 07/13/2016   Procedure: LUMBAR TWO-THREE, LUMBAR THREE-FOUR, LUMBAR FOUR-FIVE LAMINECTOMY AND FORAMINOTOMY;  Surgeon: [name], MD;  Location: [facility];  Service: Neurosurgery;  Laterality: [name];  LAMINECTOMY AND FORAMINOTOMY L2-L3, L3-L4,L4-L5   LUMBAR SPINE SURGERY  06/2020   Bilateral redo laminectomy/laminotomy/foraminotomies/medial facetectomy to decompress the bilateral L4 and L5 nerve roots [name])   TOTAL HIP ARTHROPLASTY Bilateral [id number]   TOTAL KNEE ARTHROPLASTY Right 03/06/2017   Procedure: RIGHT TOTAL KNEE ARTHROPLASTY;  Surgeon: Gaynelle Arabian, MD;  Location: [facility];  Service: Orthopedics;  Laterality: Right;    Prior to Admission medications   Medication Sig Start Date End Date Taking? Authorizing Provider  cephALEXin (KEFLEX) 500 MG capsule Take 1 capsule (500 mg total) by mouth 2 (two) times daily for 7 days. 10/07/21 10/14/21 Yes Vallarie Mare M, PA-C  acetaminophen (TYLENOL) 500 MG tablet Take 1,000 mg by mouth every 8 (eight) hours as needed for moderate pain.    [provider]  aspirin EC 81 MG EC tablet Take 1 tablet (81 mg total) by mouth daily. 06/26/19   Elgergawy, Silver Huguenin, MD  atorvastatin (LIPITOR) 20 MG tablet TAKE 1 TABLET BY MOUTH DAILY. Patient taking differently: Take 20 mg by mouth at bedtime. 07/16/21   [name], MD  Cyanocobalamin (B-12) 1000 MCG SUBL Place 1 tablet under the tongue daily. 12/07/18   Ria Bush, MD  docusate sodium (COLACE) 100 MG capsule Take 100 mg by mouth 2 (two) times daily as needed for mild constipation.    [provider]  ezetimibe (ZETIA) 10 MG tablet TAKE 1 TABLET BY MOUTH ONCE DAILY. Patient taking differently: Take 10 mg by mouth at bedtime. 07/16/21   [name], MD  fluticasone (FLONASE) 50 MCG/ACT nasal spray Place 1 spray into both nostrils as needed for allergies or rhinitis.    [provider]  furosemide (LASIX) 20 MG  tablet Take 1 tablet (20 mg total) by mouth daily as needed. Restart taking this medication tomorrow 07/12/21 Patient taking differently: Take 20 mg by mouth every other day. 07/11/21   Wyvonnia Dusky, MD  gabapentin (NEURONTIN) 600 MG tablet Take 600 mg by mouth 3 (three) times  daily.    [provider]  ipratropium-albuterol (DUONEB) 0.5-2.5 (3) MG/3ML SOLN Take 3 mLs by nebulization every 6 (six) hours as needed. 07/06/21   Tyler Pita, MD  isosorbide mononitrate (IMDUR) 30 MG 24 hr tablet Take 1 tablet (30 mg total) by mouth daily. 07/16/21 10/14/21  Theora Gianotti, NP  losartan (COZAAR) 25 MG tablet Take 1 tablet (25 mg total) by mouth daily. Restart taking this medication tomorrow 07/12/21 07/11/21   Wyvonnia Dusky, MD  Nebulizers (COMPRESSOR/NEBULIZER) MISC Use as directed. 07/06/21   Tyler Pita, MD  nitroGLYCERIN (NITROSTAT) 0.4 MG SL tablet Place 1 tablet (0.4 mg total) under the tongue every 5 (five) minutes as needed for chest pain. 05/01/21   Ria Bush, MD  OVER THE COUNTER MEDICATION Apply 1 application topically 2 (two) times daily as needed (pain). Cryotherapy roll on gel    [provider]  OXYGEN Inhale 2 L into the lungs at bedtime.    [provider]  pantoprazole (PROTONIX) 40 MG tablet Take 40 mg by mouth 2 (two) times daily. 07/16/21   [provider]  polyethylene glycol (MIRALAX / GLYCOLAX) 17 g packet Take 17 g by mouth daily as needed for mild constipation.     [provider]  PROAIR HFA 108 [id number] Base) MCG/ACT inhaler TAKE 2 PUFFS INTO LUNGS EVERY 6 HOURS ASNEEDED FOR WHEEZING OR SHORTNESS OF BREATH 02/19/21   Ria Bush, MD  ranolazine (RANEXA) 500 MG 12 hr tablet Take 1 tablet (500 mg total) by mouth 2 (two) times daily. 08/02/21   [name], MD  tamsulosin (FLOMAX) 0.4 MG CAPS capsule Take 0.4 mg by mouth at bedtime. 05/21/20   [provider]  tolterodine (DETROL LA) 4  MG 24 hr capsule TAKE 1 CAPSULE EVERY DAY 05/10/21   Stoioff, Ronda Fairly, MD    Allergies Vioxx [rofecoxib], Spiriva respimat [tiotropium bromide monohydrate], Contrast media [iodinated contrast media], and Morphine  Family History  Problem Relation Age of Onset   Stroke Mother    Heart attack Mother    Diabetes Mother    Stroke Father    Heart disease Father    Kidney disease Father        PCKD   Cancer Sister        throat   Diabetes Brother    Kidney disease Sister        PCKD   Cancer Other        5/7 nephews with lung cancer    Social History Social History   Tobacco Use   Smoking status: Former    Packs/day: 1.00    Years: 25.00    Pack years: 25.00    Types: Cigarettes    Quit date: 08/15/1988    Years since quitting: 33.1   Smokeless tobacco: Never  Vaping Use   Vaping Use: Never used  Substance Use Topics   Alcohol use: No    Alcohol/week: 0.0 standard drinks   Drug use: No     Review of Systems  Constitutional: No fever/chills Eyes:  No discharge ENT: No upper respiratory complaints. Respiratory: no cough. No SOB/ use of accessory muscles to breath Gastrointestinal:   No nausea, no vomiting.  No diarrhea.  No constipation. Musculoskeletal: Patient has left ring finger pain.  Skin: Negative for rash, abrasions, lacerations, ecchymosis.    ____________________________________________   PHYSICAL EXAM:  VITAL SIGNS: ED Triage Vitals [10/07/21 1350]  Enc Vitals Group     BP  106/64     Pulse Rate 64     Resp 20     Temp 98.8 F (37.1 C)     Temp src      SpO2 98 %     Weight      Height      Head Circumference      Peak Flow      Pain Score 4     Pain Loc      Pain Edu?      Excl. in [place]?      Constitutional: Alert and oriented. Well appearing and in no acute distress. Eyes: Conjunctivae are normal. PERRL. EOMI. Head: Atraumatic. ENT: Cardiovascular: Normal rate, regular rhythm. Normal S1 and S2.  Good peripheral  circulation. Respiratory: Normal respiratory effort without tachypnea or retractions. Lungs CTAB. Good air entry to the bases with no decreased or absent breath sounds Gastrointestinal: Bowel sounds x 4 quadrants. Soft and nontender to palpation. No guarding or rigidity. No distention. Musculoskeletal: Full range of motion to all extremities. No obvious deformities noted.  No flexor or extensor tendon deficits appreciated with left ring finger testing. Neurologic:  Normal for age. No gross focal neurologic deficits are appreciated.  Skin:  Skin is warm, dry and intact. No rash noted.  Patient has 2 cm well approximated linear laceration that appears superficial along dorsal aspect of left ring finger. Psychiatric: Mood and affect are normal for age. Speech and behavior are normal.   ____________________________________________   LABS (all labs ordered are listed, but only abnormal results are displayed)  Labs Reviewed - No data to display ____________________________________________  EKG   ____________________________________________  RADIOLOGY   No results found.  ____________________________________________    PROCEDURES  Procedure(s) performed:     Laceration Repair  Date/Time: 10/07/2021 1:53 PM Performed by: Lannie Fields, PA-C Authorized by: Lannie Fields, PA-C   Consent:    Consent obtained:  Verbal   Consent given by:  Patient   Risks discussed:  Infection, pain, retained foreign body, poor cosmetic result and poor wound healing Universal protocol:    Procedure explained and questions answered to patient or proxy's satisfaction: yes     Patient identity confirmed:  Verbally with patient Anesthesia:    Anesthesia method:  None Exploration:    Hemostasis achieved with:  Direct pressure   Contaminated: no   Treatment:    Area cleansed with:  Saline   Amount of cleaning:  Extensive   Irrigation solution:  Sterile saline   Visualized foreign  bodies/material removed: no     Debridement:  None Skin repair:    Repair method:  Tissue adhesive Approximation:    Approximation:  Close Repair type:    Repair type:  Simple Post-procedure details:    Dressing:  Sterile dressing   Procedure completion:  Tolerated well, no immediate complications     Medications - No data to display   ____________________________________________   INITIAL IMPRESSION / ASSESSMENT AND PLAN / ED COURSE  Pertinent labs & imaging results that were available during my care of the patient were reviewed by me and considered in my medical decision making (see chart for details).      Assessment and plan Laceration 81 year old male presents to the urgent care with a 2 cm linear laceration along the dorsal aspect of the left fourth digit repaired easily in the urgent care with Dermabond.  Patient was discharged with Keflex and his digit was splinted into extension.  Patient education regarding wound  care was given.  Tylenol was recommended for discomfort.  Tetanus status is up-to-date.     ____________________________________________  FINAL CLINICAL IMPRESSION(S) / ED DIAGNOSES  Final diagnoses:  Laceration of left ring finger with foreign body without damage to nail, initial encounter      NEW MEDICATIONS STARTED DURING THIS VISIT:  ED Discharge Orders          Ordered    cephALEXin (KEFLEX) 500 MG capsule  2 times daily        10/07/21 1349                This chart was dictated using voice recognition software/Dragon. Despite best efforts to proofread, errors can occur which can change the meaning. Any change was purely unintentional.     Lannie Fields, PA-C 10/07/21 1354

## 2021-10-10 LAB — CULTURE, URINE COMPREHENSIVE

## 2021-10-11 ENCOUNTER — Telehealth: Payer: Self-pay

## 2021-10-11 DIAGNOSIS — M1710 Unilateral primary osteoarthritis, unspecified knee: Secondary | ICD-10-CM | POA: Diagnosis not present

## 2021-10-11 DIAGNOSIS — J449 Chronic obstructive pulmonary disease, unspecified: Secondary | ICD-10-CM | POA: Diagnosis not present

## 2021-10-11 MED ORDER — AMOXICILLIN 875 MG PO TABS: 875.0000 mg | ORAL_TABLET | Freq: Two times a day (BID) | ORAL | 0 refills | Status: DC

## 2021-10-11 MED ORDER — LEVOFLOXACIN IN D5W 750 MG/150ML IV SOLN: 500.0000 mg | Freq: Once | INTRAVENOUS | Status: DC

## 2021-10-11 NOTE — Addendum Note (Signed)
Addended by: Gerald Leitz A on: 10/11/2021 10:21 AM   Modules accepted: Orders

## 2021-10-11 NOTE — Telephone Encounter (Signed)
-----   Message from Abbie Sons, MD sent at 10/10/2021 12:21 PM EST ----- Preop urine culture was positive for Enterococcus at a low level.  If he is not having any UTI symptoms this would represent colonization.  If no symptoms send in Rx amoxicillin 875 mg twice daily x5 days.  Have him get 2 doses in Monday 2/27 and he should be fine for surgery on Tuesday.  Please change preop IV antibiotic to Levaquin 500 mg.

## 2021-10-11 NOTE — Telephone Encounter (Signed)
Spoke with patient about need for anitbiotic due to urine culture. States that he has been having a stomach bug this weekend and has been feeling very weak. We decided to move his surgery from tomorrow until March 7th. Patient will start Amoxicillin on Thursday March 2nd. Patient verbalized understanding. I have sent the medication to [facility] per patient request.

## 2021-10-14 ENCOUNTER — Telehealth: Payer: Self-pay

## 2021-10-14 DIAGNOSIS — J449 Chronic obstructive pulmonary disease, unspecified: Secondary | ICD-10-CM

## 2021-10-14 NOTE — Telephone Encounter (Signed)
Dr Patsey Berthold please advise: ? ? ?Per Dr Patsey Berthold based off of pts ONO pt qualifies for 2L of O2 bled in with CPAP. Called and spoke to patient in regards to results.  ? ? ? ?Patient states he has not received his CPAP yet and states he was under the impression of starting the CPAP and repeating the ONO on CPAP before adding O2 . Looks like we placed the order for cpap with 2L blended in February.  ?

## 2021-10-15 DIAGNOSIS — G4733 Obstructive sleep apnea (adult) (pediatric): Secondary | ICD-10-CM | POA: Diagnosis not present

## 2021-10-15 DIAGNOSIS — I10 Essential (primary) hypertension: Secondary | ICD-10-CM | POA: Diagnosis not present

## 2021-10-15 DIAGNOSIS — R0683 Snoring: Secondary | ICD-10-CM | POA: Diagnosis not present

## 2021-10-15 DIAGNOSIS — M1710 Unilateral primary osteoarthritis, unspecified knee: Secondary | ICD-10-CM | POA: Diagnosis not present

## 2021-10-19 ENCOUNTER — Ambulatory Visit
Admission: RE | Admit: 2021-10-19 | Discharge: 2021-10-19 | Disposition: A | Discharge status: Home / Self Care | Payer: PPO | Attending: Urology | Admitting: Urology

## 2021-10-19 ENCOUNTER — Encounter
Admission: RE | Disposition: A | Discharge status: Home / Self Care | Payer: Self-pay | Source: Home / Self Care | Attending: Urology

## 2021-10-19 ENCOUNTER — Encounter: Payer: Self-pay | Admitting: Urology

## 2021-10-19 ENCOUNTER — Other Ambulatory Visit: Payer: Self-pay

## 2021-10-19 ENCOUNTER — Ambulatory Visit: Payer: PPO | Admitting: Urgent Care

## 2021-10-19 ENCOUNTER — Ambulatory Visit: Payer: PPO

## 2021-10-19 DIAGNOSIS — J449 Chronic obstructive pulmonary disease, unspecified: Secondary | ICD-10-CM | POA: Insufficient documentation

## 2021-10-19 DIAGNOSIS — I130 Hypertensive heart and chronic kidney disease with heart failure and stage 1 through stage 4 chronic kidney disease, or unspecified chronic kidney disease: Secondary | ICD-10-CM | POA: Insufficient documentation

## 2021-10-19 DIAGNOSIS — C679 Malignant neoplasm of bladder, unspecified: Secondary | ICD-10-CM | POA: Insufficient documentation

## 2021-10-19 DIAGNOSIS — Z951 Presence of aortocoronary bypass graft: Secondary | ICD-10-CM | POA: Insufficient documentation

## 2021-10-19 DIAGNOSIS — D494 Neoplasm of unspecified behavior of bladder: Secondary | ICD-10-CM | POA: Diagnosis not present

## 2021-10-19 DIAGNOSIS — N183 Chronic kidney disease, stage 3 unspecified: Secondary | ICD-10-CM | POA: Diagnosis not present

## 2021-10-19 DIAGNOSIS — I209 Angina pectoris, unspecified: Secondary | ICD-10-CM | POA: Insufficient documentation

## 2021-10-19 DIAGNOSIS — G4733 Obstructive sleep apnea (adult) (pediatric): Secondary | ICD-10-CM | POA: Insufficient documentation

## 2021-10-19 DIAGNOSIS — I5032 Chronic diastolic (congestive) heart failure: Secondary | ICD-10-CM | POA: Diagnosis not present

## 2021-10-19 DIAGNOSIS — I252 Old myocardial infarction: Secondary | ICD-10-CM | POA: Diagnosis not present

## 2021-10-19 DIAGNOSIS — C674 Malignant neoplasm of posterior wall of bladder: Secondary | ICD-10-CM | POA: Insufficient documentation

## 2021-10-19 DIAGNOSIS — Z87891 Personal history of nicotine dependence: Secondary | ICD-10-CM | POA: Insufficient documentation

## 2021-10-19 DIAGNOSIS — K219 Gastro-esophageal reflux disease without esophagitis: Secondary | ICD-10-CM | POA: Diagnosis not present

## 2021-10-19 HISTORY — PX: TRANSURETHRAL RESECTION OF BLADDER TUMOR: SHX2575

## 2021-10-19 HISTORY — PX: BLADDER INSTILLATION: SHX6893

## 2021-10-19 SURGERY — TURBT (TRANSURETHRAL RESECTION OF BLADDER TUMOR)
Anesthesia: General

## 2021-10-19 MED ORDER — ORAL CARE MOUTH RINSE: 15.0000 mL | Freq: Once | OROMUCOSAL | Status: AC

## 2021-10-19 MED ORDER — FENTANYL CITRATE (PF) 100 MCG/2ML IJ SOLN
INTRAMUSCULAR | Status: AC
  Filled 2021-10-19: qty 2

## 2021-10-19 MED ORDER — PHENYLEPHRINE 40 MCG/ML (10ML) SYRINGE FOR IV PUSH (FOR BLOOD PRESSURE SUPPORT)
PREFILLED_SYRINGE | INTRAVENOUS | Status: DC | PRN
Start: 2021-10-19 — End: 2021-10-19
  Administered 2021-10-19 (×3): 80 ug via INTRAVENOUS

## 2021-10-19 MED ORDER — CHLORHEXIDINE GLUCONATE 0.12 % MT SOLN
OROMUCOSAL | Status: AC
  Filled 2021-10-19: qty 15

## 2021-10-19 MED ORDER — DEXAMETHASONE SODIUM PHOSPHATE 10 MG/ML IJ SOLN
INTRAMUSCULAR | Status: AC
  Filled 2021-10-19: qty 1

## 2021-10-19 MED ORDER — GEMCITABINE CHEMO FOR BLADDER INSTILLATION 2000 MG
2000.0000 mg | Freq: Once | INTRAVENOUS | Status: DC
  Filled 2021-10-19: qty 52.6

## 2021-10-19 MED ORDER — FENTANYL CITRATE (PF) 100 MCG/2ML IJ SOLN
INTRAMUSCULAR | Status: DC | PRN
  Administered 2021-10-19: 50 ug via INTRAVENOUS

## 2021-10-19 MED ORDER — LIDOCAINE HCL (PF) 2 % IJ SOLN
INTRAMUSCULAR | Status: AC
  Filled 2021-10-19: qty 5

## 2021-10-19 MED ORDER — PROPOFOL 10 MG/ML IV BOLUS
INTRAVENOUS | Status: AC
  Filled 2021-10-19: qty 20

## 2021-10-19 MED ORDER — GEMCITABINE CHEMO FOR BLADDER INSTILLATION 2000 MG
INTRAVENOUS | Status: DC | PRN
Start: 2021-10-19 — End: 2021-10-19
  Administered 2021-10-19: 2000 mg via INTRAVESICAL

## 2021-10-19 MED ORDER — ONDANSETRON HCL 4 MG/2ML IJ SOLN
INTRAMUSCULAR | Status: DC | PRN
  Administered 2021-10-19: 4 mg via INTRAVENOUS

## 2021-10-19 MED ORDER — DEXAMETHASONE SODIUM PHOSPHATE 10 MG/ML IJ SOLN
INTRAMUSCULAR | Status: DC | PRN
  Administered 2021-10-19: 5 mg via INTRAVENOUS

## 2021-10-19 MED ORDER — PROPOFOL 10 MG/ML IV BOLUS
INTRAVENOUS | Status: DC | PRN
  Administered 2021-10-19: 140 mg via INTRAVENOUS

## 2021-10-19 MED ORDER — SODIUM CHLORIDE 0.9 % IR SOLN
Status: DC | PRN
  Administered 2021-10-19: 12000 mL

## 2021-10-19 MED ORDER — FENTANYL CITRATE (PF) 100 MCG/2ML IJ SOLN
INTRAMUSCULAR | Status: AC
  Administered 2021-10-19: 25 ug via INTRAVENOUS
  Filled 2021-10-19: qty 2

## 2021-10-19 MED ORDER — CEFAZOLIN SODIUM-DEXTROSE 2-3 GM-%(50ML) IV SOLR
INTRAVENOUS | Status: DC | PRN
  Administered 2021-10-19: 2 g via INTRAVENOUS

## 2021-10-19 MED ORDER — LIDOCAINE HCL (CARDIAC) PF 100 MG/5ML IV SOSY
PREFILLED_SYRINGE | INTRAVENOUS | Status: DC | PRN
Start: 2021-10-19 — End: 2021-10-19
  Administered 2021-10-19: 100 mg via INTRAVENOUS

## 2021-10-19 MED ORDER — CEFAZOLIN SODIUM 1 G IJ SOLR
INTRAMUSCULAR | Status: AC
  Filled 2021-10-19: qty 20

## 2021-10-19 MED ORDER — CHLORHEXIDINE GLUCONATE 0.12 % MT SOLN
15.0000 mL | Freq: Once | OROMUCOSAL | Status: AC
  Administered 2021-10-19: 15 mL via OROMUCOSAL

## 2021-10-19 MED ORDER — ONDANSETRON HCL 4 MG/2ML IJ SOLN: 4.0000 mg | Freq: Once | INTRAMUSCULAR | Status: DC | PRN

## 2021-10-19 MED ORDER — ONDANSETRON HCL 4 MG/2ML IJ SOLN
INTRAMUSCULAR | Status: AC
  Filled 2021-10-19: qty 2

## 2021-10-19 MED ORDER — FENTANYL CITRATE (PF) 100 MCG/2ML IJ SOLN
25.0000 ug | INTRAMUSCULAR | Status: DC | PRN
  Administered 2021-10-19 (×3): 25 ug via INTRAVENOUS

## 2021-10-19 MED ORDER — LACTATED RINGERS IV SOLN: INTRAVENOUS | Status: DC

## 2021-10-19 MED ORDER — EPHEDRINE 5 MG/ML INJ
INTRAVENOUS | Status: AC
  Filled 2021-10-19: qty 5

## 2021-10-19 SURGICAL SUPPLY — 34 items
BAG DRAIN CYSTO-URO LG1000N (MISCELLANEOUS) ×5 IMPLANT
BAG DRN RND TRDRP ANRFLXCHMBR (UROLOGICAL SUPPLIES) ×2
BAG URINE DRAIN 2000ML AR STRL (UROLOGICAL SUPPLIES) ×5 IMPLANT
BRUSH SCRUB EZ  4% CHG (MISCELLANEOUS) ×4
BRUSH SCRUB EZ 4% CHG (MISCELLANEOUS) ×3 IMPLANT
CATH FOLEY 2WAY 18X30 (CATHETERS) IMPLANT
CATH FOLEY 2WAY SIL 18X30 (CATHETERS)
CATH URETL OPEN END 6X70 (CATHETERS) ×2 IMPLANT
DRAPE UTILITY 15X26 TOWEL STRL (DRAPES) ×5 IMPLANT
DRSG TELFA 4X3 1S NADH ST (GAUZE/BANDAGES/DRESSINGS) ×5 IMPLANT
ELECT LOOP 22F BIPOLAR SML (ELECTROSURGICAL)
ELECT REM PT RETURN 9FT ADLT (ELECTROSURGICAL)
ELECTRODE LOOP 22F BIPOLAR SML (ELECTROSURGICAL) IMPLANT
ELECTRODE REM PT RTRN 9FT ADLT (ELECTROSURGICAL) IMPLANT
GAUZE 4X4 16PLY ~~LOC~~+RFID DBL (SPONGE) ×7 IMPLANT
GLOVE SURG UNDER POLY LF SZ7.5 (GLOVE) ×5 IMPLANT
GOWN STRL REUS W/ TWL LRG LVL3 (GOWN DISPOSABLE) ×3 IMPLANT
GOWN STRL REUS W/ TWL XL LVL3 (GOWN DISPOSABLE) ×3 IMPLANT
GOWN STRL REUS W/TWL LRG LVL3 (GOWN DISPOSABLE) ×4
GOWN STRL REUS W/TWL XL LVL3 (GOWN DISPOSABLE) ×4
GOWN STRL REUS W/TWL XL LVL4 (GOWN DISPOSABLE) ×5 IMPLANT
GUIDEWIRE STR DUAL SENSOR (WIRE) ×5 IMPLANT
IV NS IRRIG 3000ML ARTHROMATIC (IV SOLUTION) ×16 IMPLANT
KIT TURNOVER CYSTO (KITS) ×5 IMPLANT
LOOP CUT BIPOLAR 24F LRG (ELECTROSURGICAL) ×3 IMPLANT
MANIFOLD NEPTUNE II (INSTRUMENTS) ×2 IMPLANT
PACK CYSTO AR (MISCELLANEOUS) ×5 IMPLANT
SET CYSTO W/LG BORE CLAMP LF (SET/KITS/TRAYS/PACK) ×5 IMPLANT
SET IRRIG Y TYPE TUR BLADDER L (SET/KITS/TRAYS/PACK) ×5 IMPLANT
SURGILUBE 2OZ TUBE FLIPTOP (MISCELLANEOUS) ×5 IMPLANT
SYR TOOMEY IRRIG 70ML (MISCELLANEOUS) ×4
SYRINGE TOOMEY IRRIG 70ML (MISCELLANEOUS) ×3 IMPLANT
WATER STERILE IRR 1000ML POUR (IV SOLUTION) ×2 IMPLANT
WATER STERILE IRR 500ML POUR (IV SOLUTION) ×5 IMPLANT

## 2021-10-19 NOTE — Transfer of Care (Addendum)
Immediate Anesthesia Transfer of Care Note ? ?Patient: [name] ? ?Procedure(s) Performed: TRANSURETHRAL RESECTION OF BLADDER TUMOR (TURBT) ?BLADDER INSTILLATION OF GEMCITABINE ? ?Patient Location: PACU ? ?Anesthesia Type:general ?Level of Consciousness: drowsy ? ?Airway & Oxygen Therapy: Patient Spontanous Breathing ? ?Post-op Assessment: Report given to RN ? ?Post vital signs: stable ? ?Last Vitals:  ?Vitals Value Taken Time  ?BP    ?Temp    ?Pulse    ?Resp    ?SpO2    ? ? ?Last Pain:  ?Vitals:  ? 10/19/21 1253  ?TempSrc: Temporal  ?PainSc: 4   ?   ? ?  ? ?Complications: No notable events documented. ?

## 2021-10-19 NOTE — Interval H&P Note (Signed)
History and Physical Interval Note: ? ?CV: RRR ?Lungs: Clear ? ?10/19/2021 ?1:55 PM ? ?[name]  has presented today for surgery, with the diagnosis of Bladder Tumor.  The various methods of treatment have been discussed with the patient and family. After consideration of risks, benefits and other options for treatment, the patient has consented to  Procedure(s): ?TRANSURETHRAL RESECTION OF BLADDER TUMOR (TURBT) ([name]) ?CYSTOSCOPY WITH RETROGRADE PYELOGRAM (Bilateral) ?BLADDER INSTILLATION OF GEMCITABINE ([name]) as a surgical intervention.  The patient's history has been reviewed, patient examined, no change in status, stable for surgery.  I have reviewed the patient's chart and labs.  Questions were answered to the patient's satisfaction.   ? ? ?[name] C [name] ? ? ?

## 2021-10-19 NOTE — Op Note (Signed)
Preoperative diagnosis: ?Bladder tumor (3 cm) ? ?Postoperative diagnosis:  ?Bladder tumor (3 cm) ? ?Procedure: ? ?Cystoscopy ?Transurethral resection of bladder tumor (2-5 cm) ?Instillation intravesical gemcitabine ? ? ?Surgeon: Ronda Fairly. Skylur Fuston, M.D. ? ?Anesthesia: General ? ?Complications: None ? ?Intraoperative findings:  ?Cystoscopy-urethra normal in caliber without stricture.  Prostate with moderate lateral lobe enlargement and moderate bladder neck elevation.  3 cm papillary tumor posterolateral to the right ureteral orifice.  The tumor was close to but not involving the UO.  A 5 mm papillary tumor noted right posterior wall and a 1 cm broad-based papillary tumor left mid posterior lateral wall ? ?EBL: Minimal ? ?Specimens: ?Bladder tumor ?Base of bladder tumor ?Right posterior wall bladder tumor    ? ? ?Indication: [name] is a 81 y.o. male incidentally noted to have a bladder mass near the right UO on a pelvic MRI performed for pelvic trauma.  Office cystoscopy confirmed presence of a papillary tumor.  After reviewing the management options for treatment, he elected to proceed with the above surgical procedure(s). We have discussed the potential benefits and risks of the procedure, side effects of the proposed treatment, the likelihood of the patient achieving the goals of the procedure, and any potential problems that might occur during the procedure or recuperation. Informed consent has been obtained. ? ?Description of procedure: ? ?The patient was taken to the operating room and general anesthesia was induced.  The patient was placed in the dorsal lithotomy position, prepped and draped in the usual sterile fashion, and preoperative antibiotics were administered. A preoperative time-out was performed.  ? ?A 21 French cystoscope with obturator was lubricated, passed per urethra and advanced proximally under direct vision with findings as described above. ? ?The cystoscope was removed and A 24  French continuous-flow resectoscope sheath with visual obturator was lubricated and passed per urethra and advanced into the bladder.  ? ?An Iglesias resectoscope with loop was then placed into the sheath.  Since the tumor was close to the UO the resectoscope was replaced with a laser bridge and a 0.038 Sensor wire was placed into the right UO followed by a 6 [place] open-ended ureteral catheter.  The resectoscope was removed and repassed alongside the catheter. ? ?The bladder was then re-examined after the resectoscope was placed.  Using loop cautery resection, the entire tumor was resected and removed for permanent pathologic analysis.  The UO did not require resection.  All specimen was removed via irrigation.  The right mid posterior lesion was fulgurated. ? ?The left posterolateral wall lesion was then resected.  All specimen was removed via irrigation and sent separately. ?Cold biopsies x2 were taken at the base of the right sided tumor and sent separately. ? ?Hemostasis was then achieved with the loop cautery and the bladder was emptied and reinspected with no further bleeding noted at the end of the procedure.  The guidewire and ureteral catheter were removed. ? ?An 13 French Foley catheter was lubricated and passed per urethra.  Catheter was irrigated with return of pink-tinged effluent.   ? ?2000 mg of intravesical gemcitabine was instilled to the bladder.  This was allowed to dwell in the PACU for 1 hour. This was well-tolerated.  After 1 hour, the catheter was drained and removed. ? ?Plan: ?Follow-up scheduled 10/28/2021 however he will be notified by telephone when the pathology report is received. ? ? ?[name] Patino C. Dannel Rafter,  MD ? ?

## 2021-10-19 NOTE — Discharge Instructions (Addendum)
AMBULATORY SURGERY  ?DISCHARGE INSTRUCTIONS ? ? ?The drugs that you were given will stay in your system until tomorrow so for the next 24 hours you should not: ? ?Drive an automobile ?Make any legal decisions ?Drink any alcoholic beverage ? ? ?You may resume regular meals tomorrow.  Today it is better to start with liquids and gradually work up to solid foods. ? ?You may eat anything you prefer, but it is better to start with liquids, then soup and crackers, and gradually work up to solid foods. ? ? ?Please notify your doctor immediately if you have any unusual bleeding, trouble breathing, redness and pain at the surgery site, drainage, fever, or pain not relieved by medication. ? ? ? ?Additional Instructions: ? ? ?Transurethral Resection of Bladder Tumor (TURBT) or Bladder Biopsy ? ? ?Definition: ? Transurethral Resection of the Bladder Tumor is a surgical procedure used to diagnose and remove tumors within the bladder.  ? ?General instructions: ?   ? Your recent bladder surgery requires very little post hospital care but some definite precautions. ? ?Despite the fact that no skin incisions were used, the area around the bladder incisions are raw and covered with scabs to promote healing and prevent bleeding. Certain precautions are needed to insure that the scabs are not disturbed over the next 2-4 weeks while the healing proceeds. ? ?Because the raw surface inside your bladder and the irritating effects of urine you may expect frequency of urination and/or urgency (a stronger desire to urinate) and perhaps even getting up at night more often. This will usually resolve or improve slowly over the healing period. You may see some blood in your urine over the first 6 weeks. Do not be alarmed, even if the urine was clear for a while. Get off your feet and drink lots of fluids until clearing occurs. If you start to pass clots or don't improve call us. ? ?Diet: ? ?You may return to your normal diet immediately. Because  of the raw surface of your bladder, alcohol, spicy foods, foods high in acid and drinks with caffeine may cause irritation or frequency and should be used in moderation. To keep your urine flowing freely and avoid constipation, drink plenty of fluids during the day (8-10 glasses). Tip: Avoid cranberry juice because it is very acidic. ? ?Activity: ? ?Your physical activity doesn't need to be restricted. However, if you are very active, you may see some blood in the urine. We suggest that you reduce your activity under the circumstances until the bleeding has stopped. ? ?Bowels: ? ?It is important to keep your bowels regular during the postoperative period. Straining with bowel movements can cause bleeding. A bowel movement every other day is reasonable. Use a mild laxative if needed, such as milk of magnesia 2-3 tablespoons, or 2 Dulcolax tablets. Call if you continue to have problems. If you had been taking narcotics for pain, before, during or after your surgery, you may be constipated. Take a laxative if necessary. ? ? ? ?Medication: ? ?You should resume your pre-surgery medications unless told not to. In addition you may be given an antibiotic to prevent or treat infection. Antibiotics are not always necessary. All medication should be taken as prescribed until the bottles are finished unless you are having an unusual reaction to one of the drugs.  AZO for burning obtain over-the-counter can help with urinary discomfort ? ? ?[facility] ?[address], [address] ?[place], [place] [address] ?[phone] ? ? ? ? ? ? ?  Please contact your physician with any problems or Same Day Surgery at [phone], Monday through Friday 6 am to 4 pm, or [place] at [facility] number at [phone].  ?

## 2021-10-19 NOTE — Interval H&P Note (Signed)
History and Physical Interval Note: ? ?10/19/2021 ?1:57 PM ? ?[name]  has presented today for surgery, with the diagnosis of Bladder Tumor.  The various methods of treatment have been discussed with the patient and family. After consideration of risks, benefits and other options for treatment, the patient has consented to  Procedure(s): ?TRANSURETHRAL RESECTION OF BLADDER TUMOR (TURBT) ([name]) ?CYSTOSCOPY WITH RETROGRADE PYELOGRAM (Bilateral) ?BLADDER INSTILLATION OF GEMCITABINE ([name]) as a surgical intervention.  The patient's history has been reviewed, patient examined, no change in status, stable for surgery.  I have reviewed the patient's chart and labs.  Questions were answered to the patient's satisfaction.   ? ? ?[name] [name] [name] ? ? ?

## 2021-10-19 NOTE — Anesthesia Preprocedure Evaluation (Addendum)
Anesthesia Evaluation  ?Patient identified by MRN, date of birth, ID band ?Patient awake ? ? ? ?Airway ?Mallampati: III ? ?TM Distance: >3 FB ?Neck ROM: Limited ? ? ? Dental ? ?(+) Edentulous Upper, Lower Dentures ?  ?Pulmonary ?sleep apnea and Continuous Positive Airway Pressure Ventilation , COPD,  COPD inhaler, former smoker,  ?  ? ?+ decreased breath sounds ? ? ? ? ? Cardiovascular ?Exercise Tolerance: Poor ?hypertension, Pt. on medications ?+ angina + Past MI, + CABG and +CHF  ? ?Rhythm:Regular  ? ?  ?Neuro/Psych ? Headaches, Anxiety   ? GI/Hepatic ?Neg liver ROS, GERD  ,  ?Endo/Other  ?negative endocrine ROS ? Renal/GU ?  ? ?  ?Musculoskeletal ?negative musculoskeletal ROS ?(+)  ? Abdominal ?(+) + obese,   ?Peds ?negative pediatric ROS ?(+)  Hematology ?negative hematology ROS ?(+)   ?Anesthesia Other Findings ? ? Reproductive/Obstetrics ? ?  ? ? ? ? ? ? ? ? ? ? ? ? ? ?  ?  ? ? ? ? ? ? ? ?Anesthesia Physical ?Anesthesia Plan ? ?ASA: 3 ? ?Anesthesia Plan: General  ? ?Post-op Pain Management:   ? ?Induction: Intravenous ? ?PONV Risk Score and Plan:  ? ?Airway Management Planned: LMA ? ?Additional Equipment:  ? ?Intra-op Plan:  ? ?Post-operative Plan: Extubation in OR ? ?Informed Consent: I have reviewed the patients History and Physical, chart, labs and discussed the procedure including the risks, benefits and alternatives for the proposed anesthesia with the patient or authorized representative who has indicated his/her understanding and acceptance.  ? ? ? ? ? ?Plan Discussed with: CRNA and Surgeon ? ?Anesthesia Plan Comments:   ? ? ? ? ? ? ?Anesthesia Quick Evaluation ? ?

## 2021-10-19 NOTE — Anesthesia Postprocedure Evaluation (Signed)
Anesthesia Post Note ? ?Patient: [name] ? ?Procedure(s) Performed: TRANSURETHRAL RESECTION OF BLADDER TUMOR (TURBT) ?BLADDER INSTILLATION OF GEMCITABINE ? ?Patient location during evaluation: PACU ?Anesthesia Type: General ?Level of consciousness: awake and alert ?Pain management: pain level controlled ?Vital Signs Assessment: post-procedure vital signs reviewed and stable ?Respiratory status: spontaneous breathing, nonlabored ventilation, respiratory function stable and patient connected to nasal cannula oxygen ?Cardiovascular status: blood pressure returned to baseline and stable ?Postop Assessment: no apparent nausea or vomiting ?Anesthetic complications: no ? ? ?No notable events documented. ? ? ?Last Vitals:  ?Vitals:  ? 10/19/21 1550 10/19/21 1617  ?BP:  (!) 142/81  ?Pulse: 69 67  ?Resp: 13 14  ?Temp:  (!) 36.1 ?C  ?SpO2: 95% 95%  ?  ?Last Pain:  ?Vitals:  ? 10/19/21 1617  ?TempSrc: Temporal  ?PainSc: 2   ? ? ?  ?  ?  ?  ?  ?  ? ?Molli Barrows ? ? ? ? ?

## 2021-10-19 NOTE — Anesthesia Procedure Notes (Signed)
Procedure Name: LMA Insertion ?Date/Time: 10/19/2021 2:01 PM ?Performed by: Loletha Grayer, CRNA ?Pre-anesthesia Checklist: Patient identified, Patient being monitored, Timeout performed, Emergency Drugs available and Suction available ?Patient Re-evaluated:Patient Re-evaluated prior to induction ?Oxygen Delivery Method: Circle system utilized ?Preoxygenation: Pre-oxygenation with 100% oxygen ?Induction Type: IV induction ?Ventilation: Mask ventilation without difficulty ?LMA: LMA inserted ?LMA Size: 4.0 ?Number of attempts: 1 ?Placement Confirmation: positive ETCO2 and breath sounds checked- equal and bilateral ?Tube secured with: Tape ?Dental Injury: Teeth and Oropharynx as per pre-operative assessment  ? ? ? ? ?

## 2021-10-20 ENCOUNTER — Telehealth: Payer: Self-pay | Admitting: Pulmonary Disease

## 2021-10-20 ENCOUNTER — Encounter: Payer: Self-pay | Admitting: Urology

## 2021-10-20 ENCOUNTER — Other Ambulatory Visit: Payer: Self-pay | Admitting: Cardiovascular Disease

## 2021-10-20 DIAGNOSIS — I10 Essential (primary) hypertension: Secondary | ICD-10-CM | POA: Diagnosis not present

## 2021-10-20 DIAGNOSIS — R0683 Snoring: Secondary | ICD-10-CM | POA: Diagnosis not present

## 2021-10-20 DIAGNOSIS — M1710 Unilateral primary osteoarthritis, unspecified knee: Secondary | ICD-10-CM | POA: Diagnosis not present

## 2021-10-20 DIAGNOSIS — G4733 Obstructive sleep apnea (adult) (pediatric): Secondary | ICD-10-CM | POA: Diagnosis not present

## 2021-10-20 NOTE — Telephone Encounter (Signed)
Spoke to patient.  ?He stated that he is scheduled for ONO on Thursday. He has bladder surgery this week and is concerned that he will not being able to do test. He would like to postpone test until next week. ?I have provided patient with adapts contact number. He will contact adapt.  ?Nothing further needed.  ? ?

## 2021-10-21 ENCOUNTER — Telehealth: Payer: Self-pay | Admitting: Urology

## 2021-10-21 LAB — SURGICAL PATHOLOGY

## 2021-10-21 NOTE — Telephone Encounter (Signed)
I contacted Mr. Riggenbach ~ 1700 this evening to discuss his pathology report.  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  No muscularis was identified in the specimen. ? ?He has no postbiopsy complaints. ? ?The pathology report was discussed in detail and I recommended a 6-week course of induction BCG.  The most common side effects of frequency, urgency and dysuria were reviewed.  Occasional flulike symptoms and rarely BCG sepsis.  All questions were answered and he desires to schedule.  Will wait at least 4 weeks after TURBT and will schedule an early-mid April. ?

## 2021-10-22 ENCOUNTER — Ambulatory Visit: Payer: PPO | Admitting: Urology

## 2021-10-25 ENCOUNTER — Telehealth: Payer: Self-pay

## 2021-10-25 NOTE — Telephone Encounter (Signed)
Incoming call from pt who states that he is post op and is having some small blood clots at the beginning of his urinary stream. Pt denies, fever, chills, or pain. Advised pt that this is normal post TURBT. Advised pt to push fluids, but watchful of large clots, fever, pain, or chills. Pt voiced understanding.  ?

## 2021-10-26 ENCOUNTER — Telehealth: Payer: Self-pay | Admitting: Pulmonary Disease

## 2021-10-26 DIAGNOSIS — G4733 Obstructive sleep apnea (adult) (pediatric): Secondary | ICD-10-CM

## 2021-10-26 NOTE — Telephone Encounter (Signed)
Spoke to patient.  ?He feels that cpap pressure is too strong. Currently set at 5-20. ?He feels that 7.5cm is a comfortable setting for him.  ? ?Dr. Patsey Berthold, please advise. Thanks ?

## 2021-10-26 NOTE — Telephone Encounter (Signed)
Okay for adapt to adjust machine to 8 cm H2O I recommend weight as that will also cover him on nights when he may need just a little extra boost.  It is also not much different than 7.5. ?

## 2021-10-26 NOTE — Telephone Encounter (Signed)
Order placed to [employer] to change cpap pressure.  ?Patient is aware and voiced his understanding.  ?Nothing further needed.  ? ?

## 2021-10-27 ENCOUNTER — Encounter: Payer: Self-pay | Admitting: Urology

## 2021-10-27 ENCOUNTER — Other Ambulatory Visit: Payer: Self-pay

## 2021-10-27 ENCOUNTER — Ambulatory Visit (INDEPENDENT_AMBULATORY_CARE_PROVIDER_SITE_OTHER): Payer: PPO | Admitting: Urology

## 2021-10-27 VITALS — BP 130/66 | HR 81 | Ht 72.0 in | Wt 218.0 lb

## 2021-10-27 DIAGNOSIS — C679 Malignant neoplasm of bladder, unspecified: Secondary | ICD-10-CM

## 2021-10-27 NOTE — Progress Notes (Signed)
? ?10/27/2021 ?8:39 AM  ? ?[name] ?November 07, 1940 ?[id number] ? ?Referring provider: Ria Bush, MD ?[address] ?[place],  [place] [address] ? ?Chief Complaint  ?Patient presents with  ? Other  ? ? ?HPI: ?81 y.o. male presents for postop follow-up. ? ?TURBT 10/19/2021 ?Has done well postoperatively ?He was contacted last week regarding the pathology results of Ta high-grade urothelial carcinoma.  He also had an area of low-grade urothelial carcinoma on the posterior bladder wall ?We discussed induction BCG last week ? ? ?PMH: ?Past Medical History:  ?Diagnosis Date  ? 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 a. 06/2019 Echo (in setting of COVID): EF 35-40%, mild LVH, g1 DD, glob HK; b. 09/2019 Echo: EF 50-55%, Gr1 DD; c. 03/2021 Echo: EF 50-55%, no rwma, mild LVH, GrI DD, nl RV fxn. RVSP 44.91mHg. Mild-mod dil LA. Triv MR. Mild-mod Ao sclerosis.  ? AAA (abdominal aortic aneurysm)   ? Actinic keratosis   ? Anginal pain ([place]   ? BENIGN PROSTATIC HYPERTROPHY, WITH URINARY OBSTRUCTION 09/06/2007  ? CAD s/p CABG   ? a. 1990 s/p MI-->CABG x 2; b. 2002 s/p BMS to LCX; c. 06/2015 Cath: LM 50ost, LAD 100ost/p, 6106m, D2 nl, LCX  patent stent, RCA 80p (small), LIMA->LAD nl, VG->D2 nl-->Med Rx; d. 05/2020 MV: fixed apical ant/apical defect. No signif ischemia; e. 04/2021 MV: EF 52%, no ischemia.  ? Carotid arterial disease ([place]  ? a. 08/2016 Carotid U/S: <39% bilat.  ? CHF (congestive heart failure) ([place]  ? 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 CKD (chronic kidney disease), stage III ([place]  ? Community acquired pneumonia of right lower lobe of lung 06/05/2017  ? COVID-19 virus infection 05/30/2019  ? Covid PNA with hospitalization 05/2019  ? DU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  ? DVT, HX OF 1998  ? Emphysema of lung ([facility]  ? GERD 04/30/2007  ? Headache   ? hx migraines  ? History of hiatal hernia   ? History of shingles   ? HYPERLIPIDEMIA 04/26/2007  ? HYPERTENSION 04/30/2007  ?  INGUINAL HERNIA, RIGHT 05/26/2010  ? Laceration of skin of left hand 03/08/2018  ? Lumbar disc disease with radiculopathy   ? neuropathy in feet  ? [facility]  ? 1989, 2002  ? OSA (obstructive sleep apnea)   ? a. did not tolerate CPAP.  ? Osteoarthritis   ? PAD (peripheral artery disease) ([facility]  ? a. 07/2017 LE duplex: RSFA [id number] 75-43m56m-74d; c. 08/2018 Periph Angio: No signif AoIliac dzs. Mod-sev Ca2+ RSFA w/ diff dzs throughout- 3 vessel runoff. Borderline signif LSFA dzs w/ mod-sev Ca2+ vessels and 3 vessel runoff below the knee-->Med rx.  ? Pneumonia 07/2014  ? [place]  ? Pneumonia due to COVID-19 virus 06/02/2019  ? Pre-diabetes   ? Prostatitis, chronic   ? PSA, INCREASED 07/09/2008  ? Spinal stenosis of lumbar region   ? ? ?Surgical History: ?Past Surgical History:  ?Procedure Laterality Date  ? ABDOMINAL AORTOGRAM W/LOWER EXTREMITY [name] 09/12/2018  ? Procedure: ABDOMINAL AORTOGRAM W/LOWER EXTREMITY;  Surgeon: AridWellington Hampshire;  [facility];  Service: Cardiovascular;  Laterality: [name];  ? APPENDECTOMY    ? rupture  ? BACK SURGERY    ? 1984 and then [facility]  ? BLADDER INSTILLATION [name] 10/19/2021  ? Procedure: BLADDER INSTILLATION OF GEMCITABINE;  Surgeon: StoiAbbie Sons;  Location: [facility];  Service: Urology;  Laterality: [name];  ? CARDIAC CATHETERIZATION [name] 07/07/2015  ? Procedure: Left Heart Cath and Cors/Grafts Angiography;  Surgeon: [name], MD;  Location: [facility];  Service: Cardiovascular;  Laterality: [name];  ? CATARACT EXTRACTION, BILATERAL  2009  ? CERVICAL SPINE SURGERY  12/2016  ? cervical stenosis [name])  ? COLONOSCOPY  03/2010  ? HP polyp, diverticulosis, rpt 10 yrs ([name])  ? CORONARY ANGIOPLASTY  11/30/2000  ? LCX  ? CORONARY ARTERY BYPASS GRAFT  1990  ? 3 vessel   ? CYSTOSCOPY  12/23/2010  ? [place]  ? EYE SURGERY    ? cataract  ? JOINT REPLACEMENT    ? KNEE ARTHROSCOPY Right   ? x2  ? LAMINOTOMY   1986  ? L5/S1 lumbar laminotomy for two ruptured discs/fusion  ? LUMBAR LAMINECTOMY/DECOMPRESSION MICRODISCECTOMY [name] 07/13/2016  ? Procedure: LUMBAR TWO-THREE, LUMBAR THREE-FOUR, LUMBAR FOUR-FIVE LAMINECTOMY AND FORAMINOTOMY;  Surgeon: [name], MD;  Location: [facility];  Service: Neurosurgery;  Laterality: [name];  LAMINECTOMY AND FORAMINOTOMY L2-L3, L3-L4,L4-L5  ? LUMBAR SPINE SURGERY  06/2020  ? Bilateral redo laminectomy/laminotomy/foraminotomies/medial facetectomy to decompress the bilateral L4 and L5 nerve roots [name])  ? TOTAL HIP ARTHROPLASTY Bilateral 1999,2000  ? TOTAL KNEE ARTHROPLASTY Right 03/06/2017  ? Procedure: RIGHT TOTAL KNEE ARTHROPLASTY;  Surgeon: Gaynelle Arabian, MD;  [facility]: [facility];  Service: Orthopedics;  Laterality: Right;  ? TRANSURETHRAL RESECTION OF BLADDER TUMOR [name] 10/19/2021  ? Procedure: TRANSURETHRAL RESECTION OF BLADDER TUMOR (TURBT);  Surgeon: Abbie Sons, MD;  Location: [facility] OR[facility];  Service: Urology;  Laterality: [name];  ? ? ?Home Medications:  ?Allergies as of 10/27/2021   ? ?   Reactions  ? Vioxx [rofecoxib] Other (See Comments)  ? Hemorrhage   ? Spiriva Respimat [tiotropium Bromide Monohydrate] Other (See Comments)  ? Elevated bp  ? Contrast Media [iodinated Contrast Media] Itching, Rash  ? Delayed reaction post abdominal aortagram.   ? Morphine Nausea Only, Other (See Comments)  ? Irritability  ? ?  ? ?  ?Medication List  ?  ? ?  ? Accurate as of October 27, 2021  8:39 AM. If you have any questions, ask your nurse or doctor.  ?  ?  ? ?  ? ?acetaminophen 500 MG tablet ?Commonly known as: TYLENOL ?Take 1,000 mg by mouth every 8 (eight) hours as needed for moderate pain. ?  ?amoxicillin 875 MG tablet ?Commonly known as: AMOXIL ?Take 1 tablet (875 mg total) by mouth every 12 (twelve) hours. ?  ?aspirin 81 MG EC tablet ?Take 1 tablet (81 mg total) by mouth daily. ?  ?atorvastatin 20 MG tablet ?Commonly known as: LIPITOR ?TAKE 1 TABLET BY MOUTH DAILY. ?  ?B-12 1000 MCG Subl ?Place 1  tablet under the tongue daily. ?  ?Compressor/Nebulizer Misc ?Use as directed. ?  ?docusate sodium 100 MG capsule ?Commonly known as: COLACE ?Take 100 mg by mouth 2 (two) times daily as needed for mild constipation. ?  ?ezetimibe 10 MG tablet ?Commonly known as: ZETIA ?TAKE 1 TABLET BY MOUTH ONCE DAILY. ?What changed: when to take this ?  ?fluticasone 50 MCG/ACT nasal spray ?Commonly known as: FLONASE ?Place 1 spray into both nostrils as needed for allergies or rhinitis. ?  ?furosemide 20 MG tablet ?Commonly known as: LASIX ?Take 1 tablet (20 mg total) by mouth daily as needed. Restart taking this medication tomorrow 07/12/21 ?What changed:  ?when to take this ?additional instructions ?  ?gabapentin 600 MG tablet ?Commonly known as: NEURONTIN ?Take 600  mg by mouth 3 (three) times daily. ?  ?ipratropium-albuterol 0.5-2.5 (3) MG/3ML Soln ?Commonly known as: DUONEB ?Take 3 mLs by nebulization every 6 (six) hours as needed. ?  ?isosorbide mononitrate 30 MG 24 hr tablet ?Commonly known as: IMDUR ?Take 1 tablet (30 mg total) by mouth daily. ?  ?losartan 25 MG tablet ?Commonly known as: COZAAR ?Take 1 tablet (25 mg total) by mouth daily. Restart taking this medication tomorrow 07/12/21 ?  ?nitroGLYCERIN 0.4 MG SL tablet ?Commonly known as: NITROSTAT ?Place 1 tablet (0.4 mg total) under the tongue every 5 (five) minutes as needed for chest pain. ?  ?OVER THE COUNTER MEDICATION ?Apply 1 application topically 2 (two) times daily as needed (pain). Cryotherapy roll on gel ?  ?OXYGEN ?Inhale 2 L into the lungs at bedtime. ?  ?pantoprazole 40 MG tablet ?Commonly known as: PROTONIX ?Take 40 mg by mouth 2 (two) times daily. ?  ?polyethylene glycol 17 g packet ?Commonly known as: MIRALAX / GLYCOLAX ?Take 17 g by mouth daily as needed for mild constipation. ?  ?ProAir HFA 108 (90 Base) MCG/ACT inhaler ?Generic drug: albuterol ?TAKE 2 PUFFS INTO LUNGS EVERY 6 HOURS ASNEEDED FOR WHEEZING OR SHORTNESS OF BREATH ?  ?ranolazine 500 MG 12  hr tablet ?Commonly known as: RANEXA ?Take 1 tablet (500 mg total) by mouth 2 (two) times daily. ?  ?tamsulosin 0.4 MG Caps capsule ?Commonly known as: FLOMAX ?Take 0.4 mg by mouth at bedtime. ?  ?tolte

## 2021-10-29 ENCOUNTER — Telehealth: Payer: Self-pay

## 2021-10-29 NOTE — Telephone Encounter (Signed)
Spoke with patient in regards to scheduling BCG induction. Per the pt, he cannot start BCG until after April 20th. 6 Induction appts scheduled. BCG information messaged to pt via MyChart. ?

## 2021-11-02 ENCOUNTER — Encounter: Payer: Self-pay | Admitting: Pulmonary Disease

## 2021-11-02 DIAGNOSIS — G4733 Obstructive sleep apnea (adult) (pediatric): Secondary | ICD-10-CM

## 2021-11-02 NOTE — Telephone Encounter (Signed)
Dr Patsey Berthold please advise:  ? ? Patient states CPAP pressure of 8cm is not enough and would like pressure to be 10cm or 11cm. Can we change this?  ? ?Thank you ?

## 2021-11-03 NOTE — Addendum Note (Signed)
Addended by: Carlisle Cater on: 11/03/2021 09:17 AM ? ? Modules accepted: Orders ? ?

## 2021-11-03 NOTE — Telephone Encounter (Signed)
Okay to increase to CPAP of 12.  If she still has difficulties we will have to have a formal titration study.  His initial AutoSet showed that he was averaging 7.5 to 12 cm H2O. ?

## 2021-11-05 ENCOUNTER — Telehealth: Payer: Self-pay | Admitting: Pulmonary Disease

## 2021-11-05 DIAGNOSIS — G4733 Obstructive sleep apnea (adult) (pediatric): Secondary | ICD-10-CM

## 2021-11-05 NOTE — Telephone Encounter (Signed)
Per Rodena Piety, patient canceled titration study. ? ?Study re ordered.  ? ?

## 2021-11-05 NOTE — Telephone Encounter (Signed)
The choice is he either goes back to AutoSet or he gets a titration study.  Since he is having so much difficulty I recommend a titration study because that will tell us exactly what he needs.  If we keep adjusting it this way he is not ever going to be satisfied with the result. ?

## 2021-11-05 NOTE — Telephone Encounter (Signed)
Spoke to patient. ?He stated that 12cm is too strong. He would like pressure decreased to 10cm.  ?He also stated that he received a call from [facility] for sleep study. He was not aware of repeat sleep study. ?I don't see where sleep study was ordered.  ?Appt scheduled with Dr. Halford Chessman for 02/03/2022 at 10:30. ? ?Dr. Patsey Berthold, please advise on pressure.  ? ?Rodena Piety, can you assist with sleep study? Who ordered this?  ? ?

## 2021-11-05 NOTE — Telephone Encounter (Signed)
Spoke to patient and relayed below message/recommendations. He would like to proceed with titration study.  ? ?[place], can we find out if a cpap titration is scheduled or 11/26/2021 or what kind of study? ?

## 2021-11-08 DIAGNOSIS — J449 Chronic obstructive pulmonary disease, unspecified: Secondary | ICD-10-CM | POA: Diagnosis not present

## 2021-11-08 DIAGNOSIS — M1710 Unilateral primary osteoarthritis, unspecified knee: Secondary | ICD-10-CM | POA: Diagnosis not present

## 2021-11-09 DIAGNOSIS — M4316 Spondylolisthesis, lumbar region: Secondary | ICD-10-CM | POA: Diagnosis not present

## 2021-11-11 ENCOUNTER — Other Ambulatory Visit: Payer: Self-pay | Admitting: Cardiovascular Disease

## 2021-11-11 ENCOUNTER — Ambulatory Visit: Payer: PPO | Admitting: Dermatology

## 2021-11-11 ENCOUNTER — Telehealth: Payer: Self-pay | Admitting: Pulmonary Disease

## 2021-11-11 ENCOUNTER — Encounter: Payer: Self-pay | Admitting: Family Medicine

## 2021-11-11 DIAGNOSIS — L820 Inflamed seborrheic keratosis: Secondary | ICD-10-CM | POA: Diagnosis not present

## 2021-11-11 DIAGNOSIS — L578 Other skin changes due to chronic exposure to nonionizing radiation: Secondary | ICD-10-CM

## 2021-11-11 DIAGNOSIS — D692 Other nonthrombocytopenic purpura: Secondary | ICD-10-CM | POA: Diagnosis not present

## 2021-11-11 DIAGNOSIS — D1800 Hemangioma unspecified site: Secondary | ICD-10-CM | POA: Diagnosis not present

## 2021-11-11 DIAGNOSIS — L821 Other seborrheic keratosis: Secondary | ICD-10-CM | POA: Diagnosis not present

## 2021-11-11 DIAGNOSIS — Z1283 Encounter for screening for malignant neoplasm of skin: Secondary | ICD-10-CM | POA: Diagnosis not present

## 2021-11-11 DIAGNOSIS — D229 Melanocytic nevi, unspecified: Secondary | ICD-10-CM | POA: Diagnosis not present

## 2021-11-11 DIAGNOSIS — L570 Actinic keratosis: Secondary | ICD-10-CM

## 2021-11-11 DIAGNOSIS — G4733 Obstructive sleep apnea (adult) (pediatric): Secondary | ICD-10-CM

## 2021-11-11 DIAGNOSIS — L814 Other melanin hyperpigmentation: Secondary | ICD-10-CM

## 2021-11-11 NOTE — Progress Notes (Signed)
? ?Follow-Up Visit ?  ?Subjective  ?[name] is a 81 y.o. male who presents for the following: Annual Exam (History of Aks - TBSE today). ?The patient presents for Total-Body Skin Exam (TBSE) for skin cancer screening and mole check.  The patient has spots, moles and lesions to be evaluated, some may be new or changing and the patient has concerns that these could be cancer. ? ?The following portions of the chart were reviewed this encounter and updated as appropriate:  ? Tobacco  Allergies  Meds  Problems  Med Hx  Surg Hx  Fam Hx   ?  ?Review of Systems:  No other skin or systemic complaints except as noted in HPI or Assessment and Plan. ? ?Objective  ?Well appearing patient in no apparent distress; mood and affect are within normal limits. ? ?A full examination was performed including scalp, head, eyes, ears, nose, lips, neck, chest, axillae, abdomen, back, buttocks, bilateral upper extremities, bilateral lower extremities, hands, feet, fingers, toes, fingernails, and toenails. All findings within normal limits unless otherwise noted below. ? ?Left cheek ?Erythematous stuck-on, waxy papule or plaque ? ?Left cheek x 1, right brow x 1 (2) ?Erythematous thin papules/macules with gritty scale.  ? ? ?Assessment & Plan  ? ?Bladder Cancer - currently undergoing treatment ? ?Inflamed seborrheic keratosis ?Left cheek ? ?Destruction of lesion - Left cheek ?Complexity: simple   ?Destruction method: cryotherapy   ?Informed consent: discussed and consent obtained   ?Timeout:  patient name, date of birth, surgical site, and procedure verified ?Lesion destroyed using liquid nitrogen: Yes   ?Region frozen until ice ball extended beyond lesion: Yes   ?Outcome: patient tolerated procedure well with no complications   ?Post-procedure details: wound care instructions given   ? ?AK (actinic keratosis) (2) ?Left cheek x 1, right brow x 1 ? ?RTC if not resolved after 6 weeks ? ?Destruction of lesion - Left cheek x 1,  right brow x 1 ?Complexity: simple   ?Destruction method: cryotherapy   ?Informed consent: discussed and consent obtained   ?Timeout:  patient name, date of birth, surgical site, and procedure verified ?Lesion destroyed using liquid nitrogen: Yes   ?Region frozen until ice ball extended beyond lesion: Yes   ?Outcome: patient tolerated procedure well with no complications   ?Post-procedure details: wound care instructions given   ? ?Skin cancer screening ? ?Purpura - Chronic; persistent and recurrent.  Treatable, but not curable. ?- Violaceous macules and patches ?- Benign ?- Related to trauma, age, sun damage and/or use of blood thinners, chronic use of topical and/or oral steroids ?- Observe ?- Can use OTC arnica containing moisturizer such as Dermend Bruise Formula if desired ?- Call for worsening or other concerns ? ?Lentigines ?- Scattered tan macules ?- Due to sun exposure ?- Benign-appearing, observe ?- Recommend daily broad spectrum sunscreen SPF 30+ to sun-exposed areas, reapply every 2 hours as needed. ?- Call for any changes ? ?Seborrheic Keratoses ?- Stuck-on, waxy, tan-brown papules and/or plaques  ?- Benign-appearing ?- Discussed benign etiology and prognosis. ?- Observe ?- Call for any changes ? ?Melanocytic Nevi ?- Tan-brown and/or pink-flesh-colored symmetric macules and papules ?- Benign appearing on exam today ?- Observation ?- Call clinic for new or changing moles ?- Recommend daily use of broad spectrum spf 30+ sunscreen to sun-exposed areas.  ? ?Hemangiomas ?- Red papules ?- Discussed benign nature ?- Observe ?- Call for any changes ? ?Actinic Damage ?- Chronic condition, secondary to cumulative UV/sun exposure ?- diffuse  scaly erythematous macules with underlying dyspigmentation ?- Recommend daily broad spectrum sunscreen SPF 30+ to sun-exposed areas, reapply every 2 hours as needed.  ?- Staying in the shade or wearing long sleeves, sun glasses (UVA+UVB protection) and wide brim hats (4-inch  brim around the entire circumference of the hat) are also recommended for sun protection.  ?- Call for new or changing lesions. ? ?Skin cancer screening performed today. ? ?Return in about 1 year (around 11/12/2022) for TBSE. ? ?I, Ashok Cordia, CMA, am acting as scribe for Sarina Ser, MD . ?Documentation: I have reviewed the above documentation for accuracy and completeness, and I agree with the above. ? ?Sarina Ser, MD ? ?

## 2021-11-11 NOTE — Telephone Encounter (Signed)
Pt is requesting we switch his CPAP back to auto pressures. He refuses CPAP titration  study and would not like to wait until his appt with Dr Halford Chessman in June to discuss this. I explained to patient that you are not a sleep specialist.  ? ?Per Dr Patsey Berthold we will change pressures back to 5-15 cm, this will be the last time we are able to change pressures until he see's Dr Halford Chessman in June. Order for CPAP setting change placed.  Nothing further needed.  ?

## 2021-11-11 NOTE — Patient Instructions (Signed)
Cryotherapy Aftercare ? ?Wash gently with soap and water everyday.   ?Apply Vaseline and Band-Aid daily until healed.  ? ? ?Wound Care Instructions ? ?Cleanse wound gently with soap and water once a day then pat dry with clean gauze. Apply a thing coat of Petrolatum (petroleum jelly, "Vaseline") over the wound (unless you have an allergy to this). We recommend that you use a new, sterile tube of Vaseline. Do not pick or remove scabs. Do not remove the yellow or white "healing tissue" from the base of the wound. ? ?Cover the wound with fresh, clean, nonstick gauze and secure with paper tape. You may use Band-Aids in place of gauze and tape if the would is small enough, but would recommend trimming much of the tape off as there is often too much. Sometimes Band-Aids can irritate the skin. ? ?You should call the office for your biopsy report after 1 week if you have not already been contacted. ? ?If you experience any problems, such as abnormal amounts of bleeding, swelling, significant bruising, significant pain, or evidence of infection, please call the office immediately. ? ?FOR ADULT SURGERY PATIENTS: If you need something for pain relief you may take 1 extra strength Tylenol (acetaminophen) AND 2 Ibuprofen ('200mg'$  each) together every 4 hours as needed for pain. (do not take these if you are allergic to them or if you have a reason you should not take them.) Typically, you may only need pain medication for 1 to 3 days.  ? ? ?

## 2021-11-12 ENCOUNTER — Other Ambulatory Visit: Payer: Self-pay | Admitting: Cardiovascular Disease

## 2021-11-12 ENCOUNTER — Ambulatory Visit (INDEPENDENT_AMBULATORY_CARE_PROVIDER_SITE_OTHER): Payer: PPO | Admitting: Family Medicine

## 2021-11-12 ENCOUNTER — Encounter: Payer: Self-pay | Admitting: Family Medicine

## 2021-11-12 ENCOUNTER — Encounter: Payer: Self-pay | Admitting: Dermatology

## 2021-11-12 VITALS — BP 118/60 | HR 77 | Temp 97.7°F | Ht 72.0 in | Wt 216.5 lb

## 2021-11-12 DIAGNOSIS — N1831 Chronic kidney disease, stage 3a: Secondary | ICD-10-CM | POA: Diagnosis not present

## 2021-11-12 DIAGNOSIS — R7303 Prediabetes: Secondary | ICD-10-CM | POA: Diagnosis not present

## 2021-11-12 DIAGNOSIS — J0111 Acute recurrent frontal sinusitis: Secondary | ICD-10-CM | POA: Diagnosis not present

## 2021-11-12 DIAGNOSIS — J441 Chronic obstructive pulmonary disease with (acute) exacerbation: Secondary | ICD-10-CM | POA: Diagnosis not present

## 2021-11-12 DIAGNOSIS — G4733 Obstructive sleep apnea (adult) (pediatric): Secondary | ICD-10-CM

## 2021-11-12 DIAGNOSIS — J439 Emphysema, unspecified: Secondary | ICD-10-CM

## 2021-11-12 DIAGNOSIS — D494 Neoplasm of unspecified behavior of bladder: Secondary | ICD-10-CM | POA: Insufficient documentation

## 2021-11-12 DIAGNOSIS — R0609 Other forms of dyspnea: Secondary | ICD-10-CM

## 2021-11-12 DIAGNOSIS — C679 Malignant neoplasm of bladder, unspecified: Secondary | ICD-10-CM | POA: Insufficient documentation

## 2021-11-12 LAB — RENAL FUNCTION PANEL
Albumin: 4.1 g/dL (ref 3.5–5.2)
BUN: 29 mg/dL — ABNORMAL HIGH (ref 6–23)
CO2: 28 mEq/L (ref 19–32)
Calcium: 9.1 mg/dL (ref 8.4–10.5)
Chloride: 106 mEq/L (ref 96–112)
Creatinine, Ser: 1.58 mg/dL — ABNORMAL HIGH (ref 0.40–1.50)
GFR: 41.02 mL/min — ABNORMAL LOW (ref >60.00)
Glucose, Bld: 126 mg/dL — ABNORMAL HIGH (ref 70–99)
Phosphorus: 4.5 mg/dL (ref 2.3–4.6)
Potassium: 4.3 mEq/L (ref 3.5–5.1)
Sodium: 142 mEq/L (ref 135–145)

## 2021-11-12 LAB — CBC WITH DIFFERENTIAL/PLATELET
Basophils Absolute: 0.1 10*3/uL (ref 0.0–0.1)
Basophils Relative: 1.2 % (ref 0.0–3.0)
Eosinophils Absolute: 0.3 10*3/uL (ref 0.0–0.7)
Eosinophils Relative: 5.4 % — ABNORMAL HIGH (ref 0.0–5.0)
HCT: 35.9 % — ABNORMAL LOW (ref 39.0–52.0)
Hemoglobin: 12 g/dL — ABNORMAL LOW (ref 13.0–17.0)
Lymphocytes Relative: 21.9 % (ref 12.0–46.0)
Lymphs Abs: 1.1 10*3/uL (ref 0.7–4.0)
MCHC: 33.5 g/dL (ref 30.0–36.0)
MCV: 96.7 fl (ref 78.0–100.0)
Monocytes Absolute: 0.4 10*3/uL (ref 0.1–1.0)
Monocytes Relative: 8.5 % (ref 3.0–12.0)
Neutro Abs: 3.2 10*3/uL (ref 1.4–7.7)
Neutrophils Relative %: 63 % (ref 43.0–77.0)
Platelets: 197 10*3/uL (ref 150.0–400.0)
RBC: 3.71 Mil/uL — ABNORMAL LOW (ref 4.22–5.81)
RDW: 13.6 % (ref 11.5–15.5)
WBC: 5.1 10*3/uL (ref 4.0–10.5)

## 2021-11-12 LAB — HEMOGLOBIN A1C: Hgb A1c MFr Bld: 6.4 % (ref 4.6–6.5)

## 2021-11-12 MED ORDER — DOXYCYCLINE HYCLATE 100 MG PO TABS: 100.0000 mg | ORAL_TABLET | Freq: Two times a day (BID) | ORAL | 0 refills | Status: DC

## 2021-11-12 NOTE — Assessment & Plan Note (Signed)
Chronic, ongoing. Has seen pulm and cards - thought component of pulmonary HTN, found to have OSA as per below.  ?

## 2021-11-12 NOTE — Assessment & Plan Note (Addendum)
Ongoing difficulty tolerating CPAP mask, has declined titration study, pending f/u with Dr Halford Chessman. Current setting 5-15 cmH2O. He notes nasal pillow is tolerated better.  ?

## 2021-11-12 NOTE — Assessment & Plan Note (Addendum)
Update renal panel and CBC - latest GFR 46 ?

## 2021-11-12 NOTE — Assessment & Plan Note (Signed)
Update A1c. Latest 6.6% in controlled diabetes range. Encouraged limiting added sugar in diet.  ?

## 2021-11-12 NOTE — Assessment & Plan Note (Signed)
Sees pulm, started on duonebs PRN.  ?

## 2021-11-12 NOTE — Progress Notes (Signed)
? ? Patient ID: [name], male    DOB: July 05, 1941, 81 y.o.   MRN: [id number] ? ?This visit was conducted in person. ? ?BP 118/60   Pulse 77   Temp 97.7 ?F (36.5 ?C) (Temporal)   Ht 6' (1.829 m)   Wt 216 lb 8 oz (98.2 kg)   SpO2 96%   BMI 29.36 kg/m?   ? ?CC: cough ?Subjective:  ? ?HPI: ?[name] is a 81 y.o. male presenting on 11/12/2021 for Cough (C/o prod cough, chest congestion and hoarseness.  Sxs started 11/09/21.  Denies any fever, SOB or body aches.  Neg home COVID test last night. H/o COVID and bronchitis. ) ? ? ?I last saw [name] in December. ?4d h/o chest tightness and chest congestion, cough productive of yellow mucous, hoarse voice. Some intermittent dyspnea. Coughing up more mucous than normal. Notes more dyspnea with ambulation. Notes some leg swelling.  ?No fevers/chills, ear or tooth pain, ST, PNDrainage, abd pain or nausea. No orthopnea.  ?Notes chronic sinus congestion - seeing ENT Dr Tami Ribas.  ? ?OSA on CPAP with pressure setting 5-15 cmH2O followed by Dr Halford Chessman and Dr. Patsey Berthold, last seen February 2023. He is using [employer]. COPD - started on duonebs PRN.  ? 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  He is planning to start BCG treatment April 25.  He is seeing urology Dr. Bernardo Heater. ? ?Seen at urgent care last month for left ring finger laceration treated with Dermabond and placed on Keflex course.  This is healed well. ? 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  Latest ejection fraction by ultrasound up to 50 to 55% (2022).  Also known coronary artery disease with stable angina on isosorbide and ranexa.  Known peripheral artery disease followed by Dr. [name]. ? ?He requests labs checked today.  ?   ? ?Relevant past medical, surgical, family and social history reviewed and updated as indicated. Interim medical history since our last visit  reviewed. ?Allergies and medications reviewed and updated. ?Outpatient Medications Prior to Visit  ?Medication Sig Dispense Refill  ? acetaminophen (TYLENOL) 500 MG tablet Take 1,000 mg by mouth every 8 (eight) hours as needed for moderate pain.    ? aspirin EC 81 MG EC tablet Take 1 tablet (81 mg total) by mouth daily. 30 tablet 0  ? atorvastatin (LIPITOR) 20 MG tablet TAKE 1 TABLET BY MOUTH DAILY. 90 tablet 0  ? Cyanocobalamin (B-12) 1000 MCG SUBL Place 1 tablet under the tongue daily. 30 each   ? docusate sodium (COLACE) 100 MG capsule Take 100 mg by mouth 2 (two) times daily as needed for mild constipation.    ? ezetimibe (ZETIA) 10 MG tablet TAKE 1 TABLET BY MOUTH ONCE DAILY. (Patient taking differently: Take 10 mg by mouth at bedtime.) 90 tablet 2  ? fluticasone (FLONASE) 50 MCG/ACT nasal spray Place 1 spray into both nostrils as needed for allergies or rhinitis.    ? furosemide (LASIX) 20 MG tablet TAKE ONE TABLET BY MOUTH EVERY DAY AS NEEDED 30 tablet 2  ? gabapentin (NEURONTIN) 600 MG tablet Take 600 mg by mouth 3 (three) times daily.    ? ipratropium-albuterol (DUONEB) 0.5-2.5 (3) MG/3ML SOLN Take 3 mLs by nebulization every 6 (six) hours as needed. 360 mL 11  ? losartan (COZAAR) 25 MG tablet TAKE 1 TABLET BY MOUTH DAILY 90 tablet 0  ? Nebulizers (  COMPRESSOR/NEBULIZER) MISC Use as directed. 1 each 0  ? nitroGLYCERIN (NITROSTAT) 0.4 MG SL tablet Place 1 tablet (0.4 mg total) under the tongue every 5 (five) minutes as needed for chest pain. 25 tablet 3  ? OVER THE COUNTER MEDICATION Apply 1 application topically 2 (two) times daily as needed (pain). Cryotherapy roll on gel    ? OXYGEN Inhale 2 L into the lungs at bedtime.    ? pantoprazole (PROTONIX) 40 MG tablet Take 40 mg by mouth 2 (two) times daily.    ? polyethylene glycol (MIRALAX / GLYCOLAX) 17 g packet Take 17 g by mouth daily as needed for mild constipation.     ? PROAIR HFA 108 (90 Base) MCG/ACT inhaler TAKE 2 PUFFS INTO LUNGS EVERY 6 HOURS  ASNEEDED FOR WHEEZING OR SHORTNESS OF BREATH 8.5 g 6  ? ranolazine (RANEXA) 500 MG 12 hr tablet Take 1 tablet (500 mg total) by mouth 2 (two) times daily. 180 tablet 3  ? tamsulosin (FLOMAX) 0.4 MG CAPS capsule Take 0.4 mg by mouth at bedtime.    ? tolterodine (DETROL LA) 4 MG 24 hr capsule TAKE 1 CAPSULE EVERY DAY 90 capsule 1  ? isosorbide mononitrate (IMDUR) 30 MG 24 hr tablet Take 1 tablet (30 mg total) by mouth daily. 90 tablet 3  ? amoxicillin (AMOXIL) 875 MG tablet Take 1 tablet (875 mg total) by mouth every 12 (twelve) hours. 10 tablet 0  ? ?No facility-administered medications prior to visit.  ?  ? ?Per HPI unless specifically indicated in ROS section below ?Review of Systems ? ?Objective:  ?BP 118/60   Pulse 77   Temp 97.7 ?F (36.5 ?C) (Temporal)   Ht 6' (1.829 m)   Wt 216 lb 8 oz (98.2 kg)   SpO2 96%   BMI 29.36 kg/m?   ?Wt Readings from Last 3 Encounters:  ?11/12/21 216 lb 8 oz (98.2 kg)  ?10/27/21 218 lb (98.9 kg)  ?10/05/21 218 lb (98.9 kg)  ?  ?  ?Physical Exam ?Vitals and nursing note reviewed.  ?Constitutional:   ?   Appearance: Normal appearance. He is not ill-appearing.  ?HENT:  ?   Head: Normocephalic and atraumatic.  ?   Mouth/Throat:  ?   Mouth: Mucous membranes are moist.  ?   Pharynx: Oropharynx is clear. No oropharyngeal exudate or posterior oropharyngeal erythema.  ?Eyes:  ?   Extraocular Movements: Extraocular movements intact.  ?   Pupils: Pupils are equal, round, and reactive to light.  ?Cardiovascular:  ?   Rate and Rhythm: Normal rate and regular rhythm.  ?   Pulses: Normal pulses.  ?   Heart sounds: Normal heart sounds. No murmur heard. ?Pulmonary:  ?   Effort: Pulmonary effort is normal. No respiratory distress.  ?   Breath sounds: Normal breath sounds. No wheezing, rhonchi or rales.  ?   Comments: Mild coarse breath sounds ?Musculoskeletal:  ?   Cervical back: Normal range of motion and neck supple. No rigidity.  ?   Right lower leg: No edema.  ?   Left lower leg: No edema.   ?Lymphadenopathy:  ?   Cervical: No cervical adenopathy.  ?Skin: ?   General: Skin is warm and dry.  ?   Findings: No rash.  ?Neurological:  ?   Mental Status: He is alert.  ?Psychiatric:     ?   Mood and Affect: Mood normal.     ?   Behavior: Behavior normal.  ? ?   ? ?  Assessment & Plan:  ? ?Problem List Items Addressed This Visit   ? ? Prediabetes  ?  Update A1c. Latest 6.6% in controlled diabetes range. Encouraged limiting added sugar in diet.  ?  ?  ? Relevant Orders  ? Hemoglobin A1c  ? Acute recurrent frontal sinusitis  ?  He is seeing Dr Tami Ribas for recurrent sinusitis.  ?Currently not consistent with bacterial sinusitis ?  ?  ? Relevant Medications  ? doxycycline (VIBRA-TABS) 100 MG tablet  ? Exertional dyspnea  ?  Chronic, ongoing. Has seen pulm and cards - thought component of pulmonary HTN, found to have OSA as per below.  ?  ?  ? COPD (chronic obstructive pulmonary disease) ([place])  ?  Sees pulm, started on duonebs PRN.  ?  ?  ? CKD (chronic kidney disease) stage 3, GFR 30-59 ml/min ([facility])  ?  Update renal panel and CBC - latest GFR 46 ?  ?  ? Relevant Orders  ? CBC with Differential/Platelet  ? Renal function panel  ? OSA (obstructive sleep apnea)  ?  Ongoing difficulty tolerating CPAP mask, has declined titration study, pending f/u with Dr Halford Chessman. Current setting 5-15 cmH2O. He notes nasal pillow is tolerated better.  ?  ?  ? COPD exacerbation ([place]) - Primary  ?  Increased cough, sputum production, dyspnea consistent with COPD exacerbation. Overall stable lung exam today. No need for prednisone and he prefers to avoid if able. Will Rx doxycycline 1 wk course. Continue duonebs as prescribed. Update if not improving with treatment.  ?  ?  ?  ? ?Meds ordered this encounter  ?Medications  ? doxycycline (VIBRA-TABS) 100 MG tablet  ?  Sig: Take 1 tablet (100 mg total) by mouth 2 (two) times daily.  ?  Dispense:  14 tablet  ?  Refill:  0  ? ?Orders Placed This Encounter  ?Procedures  ? CBC with  Differential/Platelet  ? Hemoglobin A1c  ? Renal function panel  ? ? ? ?Patient Instructions  ?Labs today  ?Possible bronchitis with COPD flare - treat with antibiotic sent to pharmacy.  ?Take doxycycline twice daily for 1 week.  ?Push fluids and rest

## 2021-11-12 NOTE — Assessment & Plan Note (Addendum)
He is seeing Dr Tami Ribas for recurrent sinusitis.  ?Currently not consistent with bacterial sinusitis ?

## 2021-11-12 NOTE — Assessment & Plan Note (Signed)
Increased cough, sputum production, dyspnea consistent with COPD exacerbation. Overall stable lung exam today. No need for prednisone and he prefers to avoid if able. Will Rx doxycycline 1 wk course. Continue duonebs as prescribed. Update if not improving with treatment.  ?

## 2021-11-12 NOTE — Patient Instructions (Addendum)
Labs today  ?Possible bronchitis with COPD flare - treat with antibiotic sent to pharmacy.  ?Take doxycycline twice daily for 1 week.  ?Push fluids and rest.  ?Let us know if not improving with treatment. ?

## 2021-11-12 NOTE — Telephone Encounter (Signed)
Spoke with pt asking for details of sxs.  Sxs started 11/09/21. Denies any fever, SOB or body aches.  Neg home COVID test last night.   ? ?Scheduled OV at 12:30 today.  ?

## 2021-11-15 DIAGNOSIS — R0683 Snoring: Secondary | ICD-10-CM | POA: Diagnosis not present

## 2021-11-15 DIAGNOSIS — G4733 Obstructive sleep apnea (adult) (pediatric): Secondary | ICD-10-CM | POA: Diagnosis not present

## 2021-11-15 DIAGNOSIS — I10 Essential (primary) hypertension: Secondary | ICD-10-CM | POA: Diagnosis not present

## 2021-11-16 ENCOUNTER — Telehealth: Payer: Self-pay

## 2021-11-16 DIAGNOSIS — G4733 Obstructive sleep apnea (adult) (pediatric): Secondary | ICD-10-CM

## 2021-11-16 NOTE — Telephone Encounter (Signed)
Spoke to patient.  ?He does not feel that ONO was accurate. He feels that his cpap machine was only set to 8cm at the time of the test.  He would like to repeat ONO while on current settings. ? ?Dr. Patsey Berthold, please advise. Thanks ? ?

## 2021-11-16 NOTE — Telephone Encounter (Signed)
Patient needs 2L of O2 bled in through cpap per Dr Patsey Berthold. Patient aware of this. Order placed nothing further needed.  ?

## 2021-11-16 NOTE — Telephone Encounter (Signed)
Called and spoke to patient, patient has a clear understanding. Nothing further needed . ?

## 2021-11-16 NOTE — Telephone Encounter (Signed)
He really needs a titration study which he declines.  He will need to discuss this with Dr. Halford Chessman. ?

## 2021-11-19 DIAGNOSIS — I509 Heart failure, unspecified: Secondary | ICD-10-CM | POA: Diagnosis not present

## 2021-11-19 DIAGNOSIS — J449 Chronic obstructive pulmonary disease, unspecified: Secondary | ICD-10-CM | POA: Diagnosis not present

## 2021-11-19 DIAGNOSIS — Z7982 Long term (current) use of aspirin: Secondary | ICD-10-CM | POA: Diagnosis not present

## 2021-11-19 DIAGNOSIS — D692 Other nonthrombocytopenic purpura: Secondary | ICD-10-CM | POA: Diagnosis not present

## 2021-11-19 DIAGNOSIS — Z7951 Long term (current) use of inhaled steroids: Secondary | ICD-10-CM | POA: Diagnosis not present

## 2021-11-19 DIAGNOSIS — I739 Peripheral vascular disease, unspecified: Secondary | ICD-10-CM | POA: Diagnosis not present

## 2021-11-19 DIAGNOSIS — I25708 Atherosclerosis of coronary artery bypass graft(s), unspecified, with other forms of angina pectoris: Secondary | ICD-10-CM | POA: Diagnosis not present

## 2021-11-19 DIAGNOSIS — G4733 Obstructive sleep apnea (adult) (pediatric): Secondary | ICD-10-CM | POA: Diagnosis not present

## 2021-11-19 DIAGNOSIS — Z6829 Body mass index (BMI) 29.0-29.9, adult: Secondary | ICD-10-CM | POA: Diagnosis not present

## 2021-12-06 DIAGNOSIS — H6982 Other specified disorders of Eustachian tube, left ear: Secondary | ICD-10-CM | POA: Diagnosis not present

## 2021-12-06 DIAGNOSIS — J019 Acute sinusitis, unspecified: Secondary | ICD-10-CM | POA: Diagnosis not present

## 2021-12-07 ENCOUNTER — Ambulatory Visit: Payer: PPO | Admitting: Physician Assistant

## 2021-12-07 DIAGNOSIS — D494 Neoplasm of unspecified behavior of bladder: Secondary | ICD-10-CM | POA: Diagnosis not present

## 2021-12-07 DIAGNOSIS — C679 Malignant neoplasm of bladder, unspecified: Secondary | ICD-10-CM

## 2021-12-07 LAB — URINALYSIS, COMPLETE
Bilirubin, UA: NEGATIVE
Glucose, UA: NEGATIVE
Ketones, UA: NEGATIVE
Leukocytes,UA: NEGATIVE
Nitrite, UA: NEGATIVE
Protein,UA: NEGATIVE
RBC, UA: NEGATIVE
Specific Gravity, UA: 1.015 (ref 1.005–1.030)
Urobilinogen, Ur: 0.2 mg/dL (ref 0.2–1.0)
pH, UA: 6 (ref 5.0–7.5)

## 2021-12-07 LAB — MICROSCOPIC EXAMINATION: Bacteria, UA: NONE SEEN

## 2021-12-07 MED ORDER — BCG LIVE 50 MG IS SUSR
3.2400 mL | Freq: Once | INTRAVESICAL | Status: AC
  Administered 2021-12-07: 81 mg via INTRAVESICAL

## 2021-12-07 NOTE — Progress Notes (Signed)
BCG Bladder Instillation ? ?BCG # 1 of 6 ? ?Due to Bladder Cancer patient is present today for a BCG treatment. Patient was cleaned and prepped in a sterile fashion with betadine. A 14FR catheter was inserted, urine return was noted 136m, urine was yellow in color.  547mof reconstituted BCG was instilled into the bladder. The catheter was then removed. Patient tolerated well, no complications were noted ? ?Performed by: SaDebroah LoopPA-C and JeGaspar ColaCMA ? ?Additional notes: Patient remained in clinic for 30 minutes following instillation today for monitoring of hypersensitivity reaction; none noted.  We reviewed post-instillation instructions including holding the urine for 2 hours with quarter turns every 15 minutes and pouring bleach into the toilet with subsequent voids for 6 hours. Written instructions also provided today. He expressed understanding.  ? ?Follow ups: 1 week for BCG #2 of 6 ?

## 2021-12-07 NOTE — Patient Instructions (Addendum)
Your Timeline for Today: ? ?Right now through 12:55pm: Hold your urine and do your quarter turns every 15 minutes. ?12:55pm: Take your Lasix. ?12:55pm-6:55pm today: Every time you urinate, pour 1/2 cup of bleach into the toilet and let it sit for 15 minutes prior to flushing. ?6:55pm onward: Resume your normal routine.  ? ?Patient Education: ?(BCG) Into the Bladder (Intravesical Chemotherapy) ? ?BCG is a vaccine which is used to prevent tuberculosis (TB).  But it's also a helpful treatment for some early bladder cancers.  When BCG goes directly into the bladder the treatment is described as intravesical.  BCG is a type of immunotherapy.  Immunotherapy stimulates the body's immune system to destroy cancer cells. ? ?How it's given ?BCG treatment is given to you in an outpatient setting.  It takes a few minutes to administer and you can go home as soon as it's finished.  It might be a good idea to ask someone to bring you, particularly the fist time. ? ?Unlike chemotherapy into the bladder, BCG treatment is never given immediately after surgery to remove bladder tumors.  There needs to be a delay usually of at least two weeks after surgery, before you can have it.  You won't be given treatment with BCG if you are unwell or have an infection in your urine. ? ?You're usually asked to limit the amount you drink before your treatment.  This will help to increase the concentration of BCG in your bladder.  Drinking too much before your treatment may make your bladder feel uncomfortably full.  If you normally take water tablets (diuretics) take them later in the day after your treatment.  Your nurse or doctor will give you more advise about preparing for your treatment. ? ?You will have a small tube (catheter) placed into your bladder.  Your doctor will then put the liquid vaccine directly into your bladder through the catheter and remove the catheter. ? ?You will need to hold your urine for two hours afterwards.  This can be  difficult but it's to give the treatment time to work.  You can walk around during this time.  When the treatment is over you can go to the toilet. ? ?After your treatment there are some precautions you'll need to take.  This is because BCG is a live vaccine and other people shouldn't be exposed to it. ? ?For the next six hours, you'll need to avoid your urine splashing on the toilet seat and getting any urine on your hands.  It might be easer for men to sit down when they're using an ordinary toilet although using a stand up urinal should be alright.  The main this is to avoid splashing urine and spreading the vaccine.  You will also be asked to put 1/2 cup undiluted bleach into the toilet to destroy any live vaccine and leave it for 15 minutes until you flush. ? ?Side Effects ?Because BCG goes directly into the bladder most of the side effects are linked with the bladder.  They usually go away within one to two days after your treatment.  The most common ones are: ?-needing to pass urine often ?-pain when you pass urine ?-blood in urine ?-flu-like symptoms (tiredness, general aching and raised temperature) ? ?Theses side effects should settle down within a day or two.  If they don't get better contact your doctor.  Drinking lots of fluids can help flush the drug out of your bladder and reduce some of these effects.  Taking  Ibuprofen or Aleve is encouraged unless you have a condition that would make these medications unsafe to take (renal failure, diabetes, gerd) ? ?Rare side effects can include a continuing high temperature (fever), pain in your joints and a cough.  If you have any of these symptoms, or if you feel generally unwell, contact your doctor.  These symptoms could be a sign of a more serious infection (due to BCG) that needs to be treated immediately.  If this happens you'll be treated with the same drugs (antibiotics) that are used to treat TB. ? ?Contraception ?Men should use a condom during sex for  the first 48 hours after their treatment.  If you are a women who has had BCG treatment then your partner should use a condom.  Using a condom will protect your partner from any vaccine present in your semen or vaginal fluid. ? ?We don't know how BCG may affect a developing fetus so it's not advisable to become pregnant or father a child while having it.  It is important to use effective contraception during your treatment and for six weeks afterwards.  You can discuss this with your doctor or specialist nurse. ?

## 2021-12-09 DIAGNOSIS — M1710 Unilateral primary osteoarthritis, unspecified knee: Secondary | ICD-10-CM | POA: Diagnosis not present

## 2021-12-09 DIAGNOSIS — J449 Chronic obstructive pulmonary disease, unspecified: Secondary | ICD-10-CM | POA: Diagnosis not present

## 2021-12-14 ENCOUNTER — Encounter: Payer: Self-pay | Admitting: Physician Assistant

## 2021-12-14 ENCOUNTER — Ambulatory Visit: Payer: PPO | Admitting: Physician Assistant

## 2021-12-14 DIAGNOSIS — C679 Malignant neoplasm of bladder, unspecified: Secondary | ICD-10-CM | POA: Diagnosis not present

## 2021-12-14 LAB — URINALYSIS, COMPLETE
Bilirubin, UA: NEGATIVE
Glucose, UA: NEGATIVE
Ketones, UA: NEGATIVE
Leukocytes,UA: NEGATIVE
Nitrite, UA: NEGATIVE
Protein,UA: NEGATIVE
RBC, UA: NEGATIVE
Specific Gravity, UA: 1.02 (ref 1.005–1.030)
Urobilinogen, Ur: 1 mg/dL (ref 0.2–1.0)
pH, UA: 6 (ref 5.0–7.5)

## 2021-12-14 LAB — MICROSCOPIC EXAMINATION: Bacteria, UA: NONE SEEN

## 2021-12-14 MED ORDER — BCG LIVE 50 MG IS SUSR
3.2400 mL | Freq: Once | INTRAVESICAL | Status: AC
  Administered 2021-12-14: 81 mg via INTRAVESICAL

## 2021-12-14 NOTE — Progress Notes (Signed)
BCG Bladder Instillation ? ?BCG # 2 of 6 ? ?Due to Bladder Cancer patient is present today for a BCG treatment. Patient was cleaned and prepped in a sterile fashion with betadine. A 14FR catheter was inserted, urine return was noted 150m, urine was yellow in color.  568mof reconstituted BCG was instilled into the bladder. The catheter was then removed. Patient tolerated well, no complications were noted ? ?Performed by: CaElberta LeatherwoodCMA, SaDebroah LoopPA-C ? ?Follow up/ Additional notes: 1 week #3 ? ? ? ?

## 2021-12-15 DIAGNOSIS — I10 Essential (primary) hypertension: Secondary | ICD-10-CM | POA: Diagnosis not present

## 2021-12-15 DIAGNOSIS — R0683 Snoring: Secondary | ICD-10-CM | POA: Diagnosis not present

## 2021-12-15 DIAGNOSIS — G4733 Obstructive sleep apnea (adult) (pediatric): Secondary | ICD-10-CM | POA: Diagnosis not present

## 2021-12-20 ENCOUNTER — Encounter: Payer: Self-pay | Admitting: Pulmonary Disease

## 2021-12-20 ENCOUNTER — Ambulatory Visit: Payer: PPO | Admitting: Pulmonary Disease

## 2021-12-20 VITALS — BP 130/70 | HR 69 | Temp 97.7°F | Ht 72.0 in | Wt 220.6 lb

## 2021-12-20 DIAGNOSIS — G4736 Sleep related hypoventilation in conditions classified elsewhere: Secondary | ICD-10-CM

## 2021-12-20 DIAGNOSIS — I272 Pulmonary hypertension, unspecified: Secondary | ICD-10-CM

## 2021-12-20 DIAGNOSIS — G4733 Obstructive sleep apnea (adult) (pediatric): Secondary | ICD-10-CM

## 2021-12-20 DIAGNOSIS — I2729 Other secondary pulmonary hypertension: Secondary | ICD-10-CM

## 2021-12-20 DIAGNOSIS — J449 Chronic obstructive pulmonary disease, unspecified: Secondary | ICD-10-CM

## 2021-12-20 NOTE — Patient Instructions (Signed)
Continue using your CPAP at the same pressures. ? ?Continue using your oxygen with the CPAP. ? ?Continue using your nebulizer at least twice a day know that you can use it up to 4 times a day as needed. ? ?See you in follow-up in 3 months time. ? ?We will arrange for you to see Dr. Halford Chessman (sleep physician) in 6 months time. ?

## 2021-12-20 NOTE — Progress Notes (Signed)
Subjective:    Patient ID: [name], male    DOB: 04/10/1941, 81 y.o.   MRN: [id number] Patient Care Team: Eustaquio Boyden, MD as PCP - General (Family Medicine) Mariah Milling Tollie Pizza, MD as PCP - Cardiology (Cardiology) Phil Dopp, [facility] as Pharmacist (Pharmacist) Antonieta Iba, MD as Consulting Physician (Cardiology) Salena Saner, MD as Consulting Physician (Pulmonary Disease)  Chief Complaint  Patient presents with   Follow-up    SOB with exertion and occ dry cough. Wearing cpap 6-8hr nightly- pressure and mask is okay.    PATIENT PROFILE 81 year old male, former smoker quit 1990 (25-pack-year history).  Past medical history significant for COPD mixed type, covid in 2020, HTN, cardiomyopathy, CAD  Last seen by me in office on 16 September 2021.   HPI [name] follows up today on the issue of severe sleep apnea.  Recall he had a split-night sleep study performed on 06 August 2021 that showed severe sleep apnea with an AHI of 60/h.  He is currently on auto CPAP at 5 to 15 cm H2O with usage over 4 hours and a 30-day period of 93% minimum pressure is 6.7 cm H2O maximum 12.1 cm H2O, residual AHI 5.6.  Overall he feels that his mask pressure is good.  He is getting more adapted to the CPAP.  Feels refreshed when he has a good night sleep on the CPAP.  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  He seems to be slowly recovering from those issues.  He has not been able to afford inhaler so he is on DuoNeb as needed 3-4 times a day.  At his last visit and overnight oximetry on room air on CPAP was requested however this has not been realized yet.  Overall he feels that he is doing well.  Review of Systems A 10 point review of systems was performed and it is as noted above otherwise negative.  Patient Active Problem List   Diagnosis Date Noted   COPD exacerbation (HCC)  11/12/2021   Bladder cancer (HCC) 11/12/2021   Acute-on-chronic kidney injury (HCC) 07/09/2021   OSA (obstructive sleep apnea)    Acute on chronic diastolic CHF (congestive heart failure) (HCC)    Right leg pain 06/22/2021   Nocturnal hypoxia 06/11/2021   Coronary artery disease involving native coronary artery of native heart    CKD (chronic kidney disease) stage 3, GFR 30-59 ml/min (HCC) 02/24/2021   Peripheral neuropathy 02/23/2021   Spondylolisthesis of lumbar region 07/07/2020   COPD (chronic obstructive pulmonary disease) (HCC) 01/14/2020   Coccydynia 09/20/2019   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   Anxiety 06/04/2019   Chronic right shoulder pain 09/10/2018   Cervical stenosis of spinal canal 06/05/2017   Pedal edema 06/05/2017   Health maintenance examination 12/06/2016   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/13/2016   Overweight (BMI 25.0-29.9) 07/04/2016   Low vitamin B12 level 01/01/2016   Exertional dyspnea 07/07/2015   Medicare annual wellness visit, subsequent 07/03/2015   Advanced care planning/counseling discussion 07/03/2015   Acute recurrent frontal sinusitis 05/05/2015   Prediabetes 05/04/2015   Pleuritic chest pain 05/04/2015   Ex-smoker 05/04/2015   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   Spermatocele 04/02/2013   Syncopal vertigo 11/04/2010   Lumbar disc disease with radiculopathy 11/04/2010   CAD, ARTERY BYPASS GRAFT 08/11/2009   PAD (peripheral artery disease) (HCC) 08/11/2009   DUPUYTREN'S CONTRACTURE, 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  Carotid stenosis 07/09/2008   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   Benign prostatic hyperplasia with urinary obstruction 09/06/2007   Essential hypertension 04/30/2007   GERD 04/30/2007   Hyperlipidemia 04/26/2007   OA (osteoarthritis) of knee 04/26/2007   DVT, HX OF 04/26/2007   Social History   Tobacco Use   Smoking status: Former    Packs/day: 1.00    Years: 25.00    Pack years: 25.00     Types: Cigarettes    Quit date: 08/15/1988    Years since quitting: 33.3   Smokeless tobacco: Never  Substance Use Topics   Alcohol use: No    Alcohol/week: 0.0 standard drinks   Allergies  Allergen Reactions   Vioxx [Rofecoxib] Other (See Comments)    Hemorrhage    Spiriva Respimat [Tiotropium Bromide Monohydrate] Other (See Comments)    Elevated bp   Contrast Media [Iodinated Contrast Media] Itching and Rash    Delayed reaction post abdominal aortagram.    Morphine Nausea Only and Other (See Comments)    Irritability    Current Meds  Medication Sig   acetaminophen (TYLENOL) 500 MG tablet Take 1,000 mg by mouth every 8 (eight) hours as needed for moderate pain.   amoxicillin-clavulanate (AUGMENTIN) 250-125 MG tablet Take 1 tablet by mouth 3 (three) times daily.   aspirin EC 81 MG EC tablet Take 1 tablet (81 mg total) by mouth daily.   atorvastatin (LIPITOR) 20 MG tablet TAKE 1 TABLET BY MOUTH DAILY.   Cyanocobalamin (B-12) 1000 MCG SUBL Place 1 tablet under the tongue daily.   docusate sodium (COLACE) 100 MG capsule Take 100 mg by mouth 2 (two) times daily as needed for mild constipation.   ezetimibe (ZETIA) 10 MG tablet TAKE 1 TABLET BY MOUTH ONCE DAILY. (Patient taking differently: Take 10 mg by mouth at bedtime.)   fluticasone (FLONASE) 50 MCG/ACT nasal spray Place 1 spray into both nostrils as needed for allergies or rhinitis.   furosemide (LASIX) 20 MG tablet TAKE ONE TABLET BY MOUTH EVERY DAY AS NEEDED   gabapentin (NEURONTIN) 600 MG tablet Take 600 mg by mouth 3 (three) times daily.   ipratropium-albuterol (DUONEB) 0.5-2.5 (3) MG/3ML SOLN Take 3 mLs by nebulization every 6 (six) hours as needed.   losartan (COZAAR) 25 MG tablet TAKE 1 TABLET BY MOUTH DAILY   Nebulizers (COMPRESSOR/NEBULIZER) MISC Use as directed.   nitroGLYCERIN (NITROSTAT) 0.4 MG SL tablet Place 1 tablet (0.4 mg total) under the tongue every 5 (five) minutes as needed for chest pain.   OVER THE COUNTER  MEDICATION Apply 1 application topically 2 (two) times daily as needed (pain). Cryotherapy roll on gel   OXYGEN Inhale 2 L into the lungs at bedtime.   pantoprazole (PROTONIX) 40 MG tablet Take 40 mg by mouth 2 (two) times daily.   polyethylene glycol (MIRALAX / GLYCOLAX) 17 g packet Take 17 g by mouth daily as needed for mild constipation.    PROAIR HFA 108 (90 Base) MCG/ACT inhaler TAKE 2 PUFFS INTO LUNGS EVERY 6 HOURS ASNEEDED FOR WHEEZING OR SHORTNESS OF BREATH   ranolazine (RANEXA) 500 MG 12 hr tablet Take 1 tablet (500 mg total) by mouth 2 (two) times daily.   tamsulosin (FLOMAX) 0.4 MG CAPS capsule Take 0.4 mg by mouth at bedtime.   tolterodine (DETROL LA) 4 MG 24 hr capsule TAKE 1 CAPSULE EVERY DAY   Immunization History  Administered Date(s) Administered   Fluad Quad(high Dose 65+) 04/17/2019   Influenza Whole 08/15/2000, 07/04/2007, 05/09/2008, 05/31/2010  Influenza, High Dose Seasonal PF 05/15/2013, 06/25/2015, 07/05/2021   Influenza,inj,Quad PF,6+ Mos 04/17/2014, 05/18/2016, 06/07/2018   Influenza-Unspecified 05/22/2017, 05/28/2020   PFIZER(Purple Top)SARS-COV-2 Vaccination 09/07/2019, 09/28/2019, 05/12/2020, 01/19/2021   Pneumococcal Conjugate-13 02/09/2015   Pneumococcal Polysaccharide-23 08/15/2000, 10/29/2008   Td 08/15/2005   Tdap 02/10/2018   Zoster Recombinat (Shingrix) 04/05/2017, 07/25/2017   Zoster, Live 02/04/2009       Objective:   Physical Exam BP 130/70 (BP Location: Left Arm, Cuff Size: Large)   Pulse 69   Temp 97.7 F (36.5 C) (Temporal)   Ht 6' (1.829 m)   Wt 220 lb 9.6 oz (100.1 kg)   SpO2 99%   BMI 29.92 kg/m  GENERAL: This is a well-developed, overweight gentleman, chronically ill-appearing, who is awake, alert, no acute distress.  Fully ambulatory. HEAD: Normocephalic, atraumatic.  EYES: Pupils equal, round, reactive to light.  No scleral icterus.  MOUTH: Poor dentition, oral mucosa moist.  No thrush. NECK: Supple. No thyromegaly. Trachea  midline. No JVD.  No adenopathy. PULMONARY: Good air entry bilaterally, no adventitious sounds.  CARDIOVASCULAR: S1 and S2. Regular rate and rhythm.  Grade 1/6 systolic ejection murmur left sternal border. GASTROINTESTINAL: Protuberant abdomen, soft, no tenderness.   MUSCULOSKELETAL: No joint deformity, no clubbing, no edema.  NEUROLOGIC: No overt focal deficits noted.  Speech is fluent.   SKIN: Intact,warm,dry.  Limited exam shows no rashes.  PSYCH: Mood and behavior normal.    Assessment & Plan:     ICD-10-CM   1. Pulmonary hypertension (HCC) - mild/moderate  I27.20    Continue CPAP with oxygen bled in Recheck O2 sats on CPAP without oxygen    2. 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 OSA (obstructive sleep apnea) - severe AHI 59.7  G47.33    Good compliance with the device Notes benefit of therapy     Will arrange for the patient to see Dr. Craige Cotta (sleep physician) in this clinic in 6 months time.  Otherwise we will see the patient in 3 months time for follow-up on his COPD issues.  Gailen Shelter, MD Advanced Bronchoscopy PCCM [facility]    *This note was dictated using voice recognition software/Dragon.  Despite best efforts to proofread, errors can occur which can change the meaning. Any transcriptional errors that result from this process are unintentional and may not be fully corrected at the time of dictation.

## 2021-12-21 ENCOUNTER — Ambulatory Visit: Payer: PPO | Admitting: Physician Assistant

## 2021-12-21 DIAGNOSIS — C679 Malignant neoplasm of bladder, unspecified: Secondary | ICD-10-CM

## 2021-12-21 DIAGNOSIS — D494 Neoplasm of unspecified behavior of bladder: Secondary | ICD-10-CM | POA: Diagnosis not present

## 2021-12-21 LAB — URINALYSIS, COMPLETE
Bilirubin, UA: NEGATIVE
Glucose, UA: NEGATIVE
Ketones, UA: NEGATIVE
Leukocytes,UA: NEGATIVE
Nitrite, UA: NEGATIVE
Protein,UA: NEGATIVE
RBC, UA: NEGATIVE
Specific Gravity, UA: 1.02 (ref 1.005–1.030)
Urobilinogen, Ur: 0.2 mg/dL (ref 0.2–1.0)
pH, UA: 6 (ref 5.0–7.5)

## 2021-12-21 LAB — MICROSCOPIC EXAMINATION: Bacteria, UA: NONE SEEN

## 2021-12-21 MED ORDER — BCG LIVE 50 MG IS SUSR
3.2400 mL | Freq: Once | INTRAVESICAL | Status: AC
  Administered 2021-12-21: 81 mg via INTRAVESICAL

## 2021-12-21 NOTE — Progress Notes (Signed)
Bcg #3 /6  ?Due to Bladder Cancer patient is present today for a BCG treatment. Patient was cleaned and prepped in a sterile fashion with betadine. A 14FR catheter was inserted, urine return was noted 54m, urine was yellow in color.  523mof reconstituted BCG was instilled into the bladder. The catheter was then removed. Patient tolerated well, no complications were noted ?  ?Performed by:, SaDebroah LoopPA-C ?  ?Follow up/ Additional notes: 1 week  ?  ?

## 2021-12-28 ENCOUNTER — Ambulatory Visit: Payer: PPO | Admitting: Physician Assistant

## 2021-12-28 DIAGNOSIS — D494 Neoplasm of unspecified behavior of bladder: Secondary | ICD-10-CM | POA: Diagnosis not present

## 2021-12-28 DIAGNOSIS — Z8551 Personal history of malignant neoplasm of bladder: Secondary | ICD-10-CM

## 2021-12-28 LAB — MICROSCOPIC EXAMINATION: Bacteria, UA: NONE SEEN

## 2021-12-28 LAB — URINALYSIS, COMPLETE
Bilirubin, UA: NEGATIVE
Glucose, UA: NEGATIVE
Ketones, UA: NEGATIVE
Leukocytes,UA: NEGATIVE
Nitrite, UA: NEGATIVE
Protein,UA: NEGATIVE
RBC, UA: NEGATIVE
Specific Gravity, UA: 1.02 (ref 1.005–1.030)
Urobilinogen, Ur: 0.2 mg/dL (ref 0.2–1.0)
pH, UA: 6 (ref 5.0–7.5)

## 2021-12-28 MED ORDER — BCG LIVE 50 MG IS SUSR
3.2400 mL | Freq: Once | INTRAVESICAL | Status: AC
  Administered 2021-12-28: 81 mg via INTRAVESICAL

## 2021-12-28 NOTE — Progress Notes (Signed)
Bcg #4/6  ? ?Due to Bladder Cancer patient is present today for a BCG treatment. Patient was cleaned and prepped in a sterile fashion with betadine. A 14FR catheter was inserted, urine return was noted 168m, urine was yellow in color.  555mof reconstituted BCG was instilled into the bladder. The catheter was then removed. Patient tolerated well, no complications were noted ?  ?Performed by:, SaDebroah LoopPA-C, JeGaspar ColaMA ?  ?Follow up/ Additional notes: 1 week  ?

## 2021-12-29 ENCOUNTER — Encounter: Payer: Self-pay | Admitting: Family Medicine

## 2021-12-29 ENCOUNTER — Ambulatory Visit (INDEPENDENT_AMBULATORY_CARE_PROVIDER_SITE_OTHER): Payer: PPO | Admitting: Family Medicine

## 2021-12-29 VITALS — BP 122/60 | HR 77 | Temp 97.8°F | Ht 72.0 in | Wt 219.1 lb

## 2021-12-29 DIAGNOSIS — M545 Low back pain, unspecified: Secondary | ICD-10-CM | POA: Diagnosis not present

## 2021-12-29 MED ORDER — CYCLOBENZAPRINE HCL 5 MG PO TABS: 5.0000 mg | ORAL_TABLET | Freq: Two times a day (BID) | ORAL | 0 refills | Status: DC | PRN

## 2021-12-29 NOTE — Patient Instructions (Addendum)
I think you had a strain to your back causing this pain.  ?Use heating pad to the back (covered in a towel) as well as tylenol '1000mg'$  twice daily for next few days.  ?May try flexeril muscle relaxant sparingly.  ?Let us know if not improving over time.  ?

## 2021-12-29 NOTE — Assessment & Plan Note (Signed)
Several days of R lower back pain with radiation of pain to groin.  ?No evidence of hernia, shingles rash, not consistent with hip arthritis pain (FROM at hip), no abdominal pain or other GI or systemic symptoms to suggest appendicitis/diverticulitis. Reassuringly symptoms are improving.  ?Anticipate R lumbar strain. Supportive measures reviewed as per instructions. Rx flexeril, tylenol, heating pad. If ongoing pain, low threshold for further imaging including lumbar and abdominal imaging in h/o bladder cancer.  ?

## 2021-12-29 NOTE — Progress Notes (Signed)
? ? Patient ID: [name], male    DOB: 09/22/1940, 81 y.o.   MRN: [id number] ? ?This visit was conducted in person. ? ?BP 122/60   Pulse 77   Temp 97.8 ?F (36.6 ?C) (Temporal)   Ht 6' (1.829 m)   Wt 219 lb 2 oz (99.4 kg)   SpO2 95%   BMI 29.72 kg/m?   ? ?CC: R back/groin pain  ?Subjective:  ? ?HPI: ?[name] is a 81 y.o. male presenting on 12/29/2021 for Groin Pain (C/o R groin pain radiating around to lower back. Started about 3 days ago. ) ? ? ?R groin pain present for 4 days, starts in R lower back and travels straight into groin. Denies inciting trauma/injury or fall. Hasn't tried anything for this yet, did try hydrocodone 2 days ago without benefit. Today pain is somewhat improved.  ?No fevers/chills, other abd pain, bowel changes including diarrhea or constipation.  ?Hot shower today helped.  ? ?Bladder cancer followed by urology, receiving BCG treatments. Tolerating treatments well. UA normal when checked yesterday.  ? ?Saw Dr Tami Ribas ENT for bad sinus infection s/p prednisone and augmentin.  ?   ? ?Relevant past medical, surgical, family and social history reviewed and updated as indicated. Interim medical history since our last visit reviewed. ?Allergies and medications reviewed and updated. ?Outpatient Medications Prior to Visit  ?Medication Sig Dispense Refill  ? acetaminophen (TYLENOL) 500 MG tablet Take 1,000 mg by mouth every 8 (eight) hours as needed for moderate pain.    ? amoxicillin-clavulanate (AUGMENTIN) 250-125 MG tablet Take 1 tablet by mouth 3 (three) times daily.    ? aspirin EC 81 MG EC tablet Take 1 tablet (81 mg total) by mouth daily. 30 tablet 0  ? atorvastatin (LIPITOR) 20 MG tablet TAKE 1 TABLET BY MOUTH DAILY. 90 tablet 0  ? Cyanocobalamin (B-12) 1000 MCG SUBL Place 1 tablet under the tongue daily. 30 each   ? docusate sodium (COLACE) 100 MG capsule Take 100 mg by mouth 2 (two) times daily as needed for mild constipation.    ? ezetimibe (ZETIA) 10 MG tablet TAKE 1  TABLET BY MOUTH ONCE DAILY. (Patient taking differently: Take 10 mg by mouth at bedtime.) 90 tablet 2  ? fluticasone (FLONASE) 50 MCG/ACT nasal spray Place 1 spray into both nostrils as needed for allergies or rhinitis.    ? furosemide (LASIX) 20 MG tablet TAKE ONE TABLET BY MOUTH EVERY DAY AS NEEDED 30 tablet 2  ? gabapentin (NEURONTIN) 600 MG tablet Take 600 mg by mouth 3 (three) times daily.    ? ipratropium-albuterol (DUONEB) 0.5-2.5 (3) MG/3ML SOLN Take 3 mLs by nebulization every 6 (six) hours as needed. 360 mL 11  ? losartan (COZAAR) 25 MG tablet TAKE 1 TABLET BY MOUTH DAILY 90 tablet 0  ? Nebulizers (COMPRESSOR/NEBULIZER) MISC Use as directed. 1 each 0  ? nitroGLYCERIN (NITROSTAT) 0.4 MG SL tablet Place 1 tablet (0.4 mg total) under the tongue every 5 (five) minutes as needed for chest pain. 25 tablet 3  ? OVER THE COUNTER MEDICATION Apply 1 application topically 2 (two) times daily as needed (pain). Cryotherapy roll on gel    ? OXYGEN Inhale 2 L into the lungs at bedtime.    ? pantoprazole (PROTONIX) 40 MG tablet Take 40 mg by mouth 2 (two) times daily.    ? polyethylene glycol (MIRALAX / GLYCOLAX) 17 g packet Take 17 g by mouth daily as needed for mild constipation.     ?  PROAIR HFA 108 (90 Base) MCG/ACT inhaler TAKE 2 PUFFS INTO LUNGS EVERY 6 HOURS ASNEEDED FOR WHEEZING OR SHORTNESS OF BREATH 8.5 g 6  ? ranolazine (RANEXA) 500 MG 12 hr tablet Take 1 tablet (500 mg total) by mouth 2 (two) times daily. 180 tablet 3  ? tamsulosin (FLOMAX) 0.4 MG CAPS capsule Take 0.4 mg by mouth at bedtime.    ? tolterodine (DETROL LA) 4 MG 24 hr capsule TAKE 1 CAPSULE EVERY DAY 90 capsule 1  ? isosorbide mononitrate (IMDUR) 30 MG 24 hr tablet Take 1 tablet (30 mg total) by mouth daily. 90 tablet 3  ? ?No facility-administered medications prior to visit.  ?  ? ?Per HPI unless specifically indicated in ROS section below ?Review of Systems ? ?Objective:  ?BP 122/60   Pulse 77   Temp 97.8 ?F (36.6 ?C) (Temporal)   Ht 6'  (1.829 m)   Wt 219 lb 2 oz (99.4 kg)   SpO2 95%   BMI 29.72 kg/m?   ?Wt Readings from Last 3 Encounters:  ?12/29/21 219 lb 2 oz (99.4 kg)  ?12/20/21 220 lb 9.6 oz (100.1 kg)  ?11/12/21 216 lb 8 oz (98.2 kg)  ?  ?  ?Physical Exam ?Vitals and nursing note reviewed.  ?Constitutional:   ?   Appearance: Normal appearance. He is not ill-appearing.  ?   Comments: Ambulating with cane due to pain  ?Abdominal:  ?   General: Bowel sounds are normal. There is distension (chronic).  ?   Palpations: Abdomen is soft. There is no mass.  ?   Tenderness: There is no abdominal tenderness. There is no right CVA tenderness, left CVA tenderness, guarding or rebound.  ?   Hernia: No hernia is present. There is no hernia in the umbilical area, left inguinal area or right inguinal area.  ?Musculoskeletal:     ?   General: Tenderness present.  ?   Right lower leg: No edema.  ?   Left lower leg: No edema.  ?   Comments:  ?No significant pain midline spine ?No paraspinous mm tenderness ?Neg SLR bilaterally.  ?No pain with int/ext rotation at hip.  ?No pain at SIJ, GTB or sciatic notch bilaterally.  ?Reproducible discomfort to palpation of left lower lateral back  ?Skin: ?   General: Skin is warm and dry.  ?   Findings: No rash.  ?   Comments: No vesicular rash  ?Neurological:  ?   Mental Status: He is alert.  ?   Comments: 5/5 strength BLE  ?Psychiatric:     ?   Mood and Affect: Mood normal.     ?   Behavior: Behavior normal.  ? ?   ?Results for orders placed or performed in visit on 12/28/21  ?Microscopic Examination  ? Urine  ?Result Value Ref Range  ? WBC, UA 0-5 0 - 5 /hpf  ? RBC 0-2 0 - 2 /hpf  ? Epithelial Cells (non renal) 0-10 0 - 10 /hpf  ? Bacteria, UA None seen None seen/Few  ?Urinalysis, Complete  ?Result Value Ref Range  ? Specific Gravity, UA 1.020 1.005 - 1.030  ? pH, UA 6.0 5.0 - 7.5  ? Color, UA Yellow Yellow  ? Appearance Ur Clear Clear  ? Leukocytes,UA Negative Negative  ? Protein,UA Negative Negative/Trace  ? Glucose,  UA Negative Negative  ? Ketones, UA Negative Negative  ? RBC, UA Negative Negative  ? Bilirubin, UA Negative Negative  ? Urobilinogen, Ur 0.2 0.2 -  1.0 mg/dL  ? Nitrite, UA Negative Negative  ? Microscopic Examination See below:   ? ? ?Assessment & Plan:  ? ?Problem List Items Addressed This Visit   ? ? Right low back pain - Primary  ?  Several days of R lower back pain with radiation of pain to groin.  ?No evidence of hernia, shingles rash, not consistent with hip arthritis pain (FROM at hip), no abdominal pain or other GI or systemic symptoms to suggest appendicitis/diverticulitis. Reassuringly symptoms are improving.  ?Anticipate R lumbar strain. Supportive measures reviewed as per instructions. Rx flexeril, tylenol, heating pad. If ongoing pain, low threshold for further imaging including lumbar and abdominal imaging in h/o bladder cancer.  ? ?  ?  ? Relevant Medications  ? cyclobenzaprine (FLEXERIL) 5 MG tablet  ?  ? ?Meds ordered this encounter  ?Medications  ? cyclobenzaprine (FLEXERIL) 5 MG tablet  ?  Sig: Take 1 tablet (5 mg total) by mouth 2 (two) times daily as needed for muscle spasms (sedation precautions).  ?  Dispense:  20 tablet  ?  Refill:  0  ? ?No orders of the defined types were placed in this encounter. ? ? ? ?Patient Instructions  ?I think you had a strain to your back causing this pain.  ?Use heating pad to the back (covered in a towel) as well as tylenol '1000mg'$  twice daily for next few days.  ?May try flexeril muscle relaxant sparingly.  ?Let us know if not improving over time.  ? ?Follow up plan: ?Return if symptoms worsen or fail to improve. ? ?Ria Bush, MD   ?

## 2021-12-31 DIAGNOSIS — M545 Low back pain, unspecified: Secondary | ICD-10-CM | POA: Diagnosis not present

## 2021-12-31 DIAGNOSIS — Z6828 Body mass index (BMI) 28.0-28.9, adult: Secondary | ICD-10-CM | POA: Diagnosis not present

## 2022-01-01 ENCOUNTER — Emergency Department (HOSPITAL_COMMUNITY): Payer: PPO

## 2022-01-01 ENCOUNTER — Encounter (HOSPITAL_COMMUNITY): Payer: Self-pay | Admitting: Emergency Medicine

## 2022-01-01 ENCOUNTER — Other Ambulatory Visit: Payer: Self-pay

## 2022-01-01 ENCOUNTER — Emergency Department (HOSPITAL_COMMUNITY)
Admission: EM | Admit: 2022-01-01 | Discharge: 2022-01-02 | Disposition: A | Discharge status: Home / Self Care | Payer: PPO | Attending: Emergency Medicine | Admitting: Emergency Medicine

## 2022-01-01 DIAGNOSIS — R1031 Right lower quadrant pain: Secondary | ICD-10-CM | POA: Diagnosis not present

## 2022-01-01 DIAGNOSIS — I700 Atherosclerosis of aorta: Secondary | ICD-10-CM | POA: Diagnosis not present

## 2022-01-01 DIAGNOSIS — M545 Low back pain, unspecified: Secondary | ICD-10-CM | POA: Diagnosis not present

## 2022-01-01 DIAGNOSIS — M5441 Lumbago with sciatica, right side: Secondary | ICD-10-CM | POA: Insufficient documentation

## 2022-01-01 DIAGNOSIS — Z7982 Long term (current) use of aspirin: Secondary | ICD-10-CM | POA: Diagnosis not present

## 2022-01-01 DIAGNOSIS — N189 Chronic kidney disease, unspecified: Secondary | ICD-10-CM | POA: Diagnosis not present

## 2022-01-01 DIAGNOSIS — C679 Malignant neoplasm of bladder, unspecified: Secondary | ICD-10-CM | POA: Diagnosis not present

## 2022-01-01 DIAGNOSIS — K5730 Diverticulosis of large intestine without perforation or abscess without bleeding: Secondary | ICD-10-CM | POA: Diagnosis not present

## 2022-01-01 DIAGNOSIS — J449 Chronic obstructive pulmonary disease, unspecified: Secondary | ICD-10-CM | POA: Insufficient documentation

## 2022-01-01 DIAGNOSIS — M5126 Other intervertebral disc displacement, lumbar region: Secondary | ICD-10-CM | POA: Diagnosis not present

## 2022-01-01 LAB — CBC WITH DIFFERENTIAL/PLATELET
Abs Immature Granulocytes: 0.02 10*3/uL (ref 0.00–0.07)
Basophils Absolute: 0 10*3/uL (ref 0.0–0.1)
Basophils Relative: 0 %
Eosinophils Absolute: 0 10*3/uL (ref 0.0–0.5)
Eosinophils Relative: 1 %
HCT: 38.7 % — ABNORMAL LOW (ref 39.0–52.0)
Hemoglobin: 12.8 g/dL — ABNORMAL LOW (ref 13.0–17.0)
Immature Granulocytes: 0 %
Lymphocytes Relative: 16 %
Lymphs Abs: 1.1 10*3/uL (ref 0.7–4.0)
MCH: 32.7 pg (ref 26.0–34.0)
MCHC: 33.1 g/dL (ref 30.0–36.0)
MCV: 98.7 fL (ref 80.0–100.0)
Monocytes Absolute: 0.3 10*3/uL (ref 0.1–1.0)
Monocytes Relative: 4 %
Neutro Abs: 5.5 10*3/uL (ref 1.7–7.7)
Neutrophils Relative %: 79 %
Platelets: 159 10*3/uL (ref 150–400)
RBC: 3.92 MIL/uL — ABNORMAL LOW (ref 4.22–5.81)
RDW: 12.5 % (ref 11.5–15.5)
WBC: 7 10*3/uL (ref 4.0–10.5)
nRBC: 0 % (ref 0.0–0.2)

## 2022-01-01 LAB — URINALYSIS, ROUTINE W REFLEX MICROSCOPIC
Bilirubin Urine: NEGATIVE
Glucose, UA: NEGATIVE mg/dL
Hgb urine dipstick: NEGATIVE
Ketones, ur: NEGATIVE mg/dL
Leukocytes,Ua: NEGATIVE
Nitrite: NEGATIVE
Protein, ur: NEGATIVE mg/dL
Specific Gravity, Urine: 1.013 (ref 1.005–1.030)
pH: 5 (ref 5.0–8.0)

## 2022-01-01 LAB — COMPREHENSIVE METABOLIC PANEL
ALT: 14 U/L (ref 0–44)
AST: 13 U/L — ABNORMAL LOW (ref 15–41)
Albumin: 3.6 g/dL (ref 3.5–5.0)
Alkaline Phosphatase: 34 U/L — ABNORMAL LOW (ref 38–126)
Anion gap: 10 (ref 5–15)
BUN: 29 mg/dL — ABNORMAL HIGH (ref 8–23)
CO2: 20 mmol/L — ABNORMAL LOW (ref 22–32)
Calcium: 8.9 mg/dL (ref 8.9–10.3)
Chloride: 108 mmol/L (ref 98–111)
Creatinine, Ser: 1.47 mg/dL — ABNORMAL HIGH (ref 0.61–1.24)
GFR, Estimated: 48 mL/min — ABNORMAL LOW (ref >60)
Glucose, Bld: 142 mg/dL — ABNORMAL HIGH (ref 70–99)
Potassium: 4.4 mmol/L (ref 3.5–5.1)
Sodium: 138 mmol/L (ref 135–145)
Total Bilirubin: 0.4 mg/dL (ref 0.3–1.2)
Total Protein: 5.9 g/dL — ABNORMAL LOW (ref 6.5–8.1)

## 2022-01-01 LAB — LIPASE, BLOOD: Lipase: 25 U/L (ref 11–51)

## 2022-01-01 MED ORDER — GABAPENTIN 300 MG PO CAPS
600.0000 mg | ORAL_CAPSULE | Freq: Once | ORAL | Status: AC
  Administered 2022-01-01: 600 mg via ORAL
  Filled 2022-01-01: qty 2

## 2022-01-01 MED ORDER — ONDANSETRON HCL 4 MG/2ML IJ SOLN
4.0000 mg | Freq: Once | INTRAMUSCULAR | Status: AC
  Administered 2022-01-01: 4 mg via INTRAVENOUS
  Filled 2022-01-01: qty 2

## 2022-01-01 MED ORDER — LIDOCAINE 5 % EX PTCH
1.0000 | MEDICATED_PATCH | CUTANEOUS | Status: DC
  Administered 2022-01-01: 1 via TRANSDERMAL
  Filled 2022-01-01: qty 1

## 2022-01-01 MED ORDER — MORPHINE SULFATE (PF) 4 MG/ML IV SOLN
4.0000 mg | Freq: Once | INTRAVENOUS | Status: AC
  Administered 2022-01-01: 4 mg via INTRAVENOUS
  Filled 2022-01-01: qty 1

## 2022-01-01 MED ORDER — DEXAMETHASONE SODIUM PHOSPHATE 10 MG/ML IJ SOLN
10.0000 mg | Freq: Once | INTRAMUSCULAR | Status: AC
  Administered 2022-01-01: 10 mg via INTRAVENOUS
  Filled 2022-01-01: qty 1

## 2022-01-01 MED ORDER — HYDROMORPHONE HCL 1 MG/ML IJ SOLN
0.5000 mg | Freq: Once | INTRAMUSCULAR | Status: AC
  Administered 2022-01-01: 0.5 mg via INTRAVENOUS
  Filled 2022-01-01: qty 1

## 2022-01-01 NOTE — Discharge Instructions (Addendum)
Lab work and imaging were all reassuring.  Please continue with the steroids that were prescribed to you.  I did prescribe you some pain medications please take as prescribed.  Please refrain from heavy lifting or overreaching this will worsen your back pain.  I have given you a short course of narcotics please take as prescribed.  This medication can make you drowsy do not consume alcohol or operate heavy machinery when taking this medication.  This medication is Tylenol in it do not take Tylenol and take this medication.   Please follow-up with Dr. [name] for further evaluation.  Come back to the emergency department if you develop chest pain, shortness of breath, severe abdominal pain, uncontrolled nausea, vomiting, diarrhea.

## 2022-01-01 NOTE — ED Triage Notes (Signed)
Pt reports right lower back pain radiating through to right groin.  Unable to lift right leg and states he cannot walk very well.  Pt states he has been getting progressively worse all week. Last took a muscle relaxer and 2 oxycodone about 3 pm today with no relief.

## 2022-01-01 NOTE — ED Provider Notes (Signed)
[facility] EMERGENCY DEPARTMENT Provider Note   CSN: [id number] Arrival date & time: 01/01/22  1748     History  Chief Complaint  Patient presents with   Back Pain    [name] is a 81 y.o. male with medical history significant for postlaminectomy syndrome, active bladder cancer, COPD, CHF, CKD here for evaluation of back pain.  Patient states he has lumbar midline back pain which radiates around right side of his abdomen into his groin.  Pain worse with movement.  Was initially seen by PCP thought to have MSK strain.  Rx'd flexeril. Seen by Dr. [name] with neurosurgery on Friday due to worsening pain prescribed prednisone. Symptoms have not improved.  He denies fever, recent falls, injuries, chest pain, shortness of breath, diarrhea, dysuria, hematuria, bowel or bladder incontinence, saddle paresthesia. Pain worse at night and with movement. Having to sleep in recliner.  Took and old Rx of Oxycodone 5-325 at 14:45 without relief of pain.  HPI     Home Medications Prior to Admission medications   Medication Sig Start Date End Date Taking? Authorizing Provider  acetaminophen (TYLENOL) 500 MG tablet Take 1,000 mg by mouth every 8 (eight) hours as needed for moderate pain.    [provider]  amoxicillin-clavulanate (AUGMENTIN) 250-125 MG tablet Take 1 tablet by mouth 3 (three) times daily.    [provider]  aspirin EC 81 MG EC tablet Take 1 tablet (81 mg total) by mouth daily. 06/26/19   Elgergawy, Silver Huguenin, MD  atorvastatin (LIPITOR) 20 MG tablet TAKE 1 TABLET BY MOUTH DAILY. 10/21/21   [name], MD  Cyanocobalamin (B-12) 1000 MCG SUBL Place 1 tablet under the tongue daily. 12/07/18   Ria Bush, MD  cyclobenzaprine (FLEXERIL) 5 MG tablet Take 1 tablet (5 mg total) by mouth 2 (two) times daily as needed for muscle spasms (sedation precautions). 12/29/21   Ria Bush, MD  docusate sodium (COLACE) 100 MG capsule Take 100  mg by mouth 2 (two) times daily as needed for mild constipation.    [provider]  ezetimibe (ZETIA) 10 MG tablet TAKE 1 TABLET BY MOUTH ONCE DAILY. Patient taking differently: Take 10 mg by mouth at bedtime. 07/16/21   [name], MD  fluticasone (FLONASE) 50 MCG/ACT nasal spray Place 1 spray into both nostrils as needed for allergies or rhinitis.    [provider]  furosemide (LASIX) 20 MG tablet TAKE ONE TABLET BY MOUTH EVERY DAY AS NEEDED 11/11/21   [name], MD  gabapentin (NEURONTIN) 600 MG tablet Take 600 mg by mouth 3 (three) times daily.    [provider]  ipratropium-albuterol (DUONEB) 0.5-2.5 (3) MG/3ML SOLN Take 3 mLs by nebulization every 6 (six) hours as needed. 07/06/21   Tyler Pita, MD  isosorbide mononitrate (IMDUR) 30 MG 24 hr tablet Take 1 tablet (30 mg total) by mouth daily. 07/16/21 10/19/21  Theora Gianotti, NP  losartan (COZAAR) 25 MG tablet TAKE 1 TABLET BY MOUTH DAILY 11/12/21   [name], MD  Nebulizers (COMPRESSOR/NEBULIZER) MISC Use as directed. 07/06/21   Tyler Pita, MD  nitroGLYCERIN (NITROSTAT) 0.4 MG SL tablet Place 1 tablet (0.4 mg total) under the tongue every 5 (five) minutes as needed for chest pain. 05/01/21   Ria Bush, MD  OVER THE COUNTER MEDICATION Apply 1 application topically 2 (two) times daily as needed (pain). Cryotherapy roll on gel    [provider]  OXYGEN Inhale  2 L into the lungs at bedtime.    [provider]  pantoprazole (PROTONIX) 40 MG tablet Take 40 mg by mouth 2 (two) times daily. 07/16/21   [provider]  polyethylene glycol (MIRALAX / GLYCOLAX) 17 g packet Take 17 g by mouth daily as needed for mild constipation.     [provider]  PROAIR HFA [phone] Base) MCG/ACT inhaler TAKE 2 PUFFS INTO LUNGS EVERY 6 HOURS ASNEEDED FOR WHEEZING OR SHORTNESS OF BREATH 02/19/21   Ria Bush, MD  ranolazine (RANEXA) 500 MG 12 hr  tablet Take 1 tablet (500 mg total) by mouth 2 (two) times daily. 08/02/21   [name], MD  tamsulosin (FLOMAX) 0.4 MG CAPS capsule Take 0.4 mg by mouth at bedtime. 05/21/20   [provider]  tolterodine (DETROL LA) 4 MG 24 hr capsule TAKE 1 CAPSULE EVERY DAY 05/10/21   Stoioff, Ronda Fairly, MD      Allergies    Vioxx [rofecoxib], Spiriva respimat [tiotropium bromide monohydrate], Contrast media [iodinated contrast media], and Morphine    Review of Systems   Review of Systems  Constitutional: Negative.   HENT: Negative.    Respiratory: Negative.    Cardiovascular: Negative.   Gastrointestinal:  Positive for abdominal pain. Negative for abdominal distention, anal bleeding, blood in stool, constipation, diarrhea, nausea, rectal pain and vomiting.  Genitourinary: Negative.   Musculoskeletal:  Positive for back pain and gait problem. Negative for myalgias, neck pain and neck stiffness.  Skin: Negative.   Neurological:  Negative for tremors, seizures, syncope, speech difficulty, light-headedness and numbness.  All other systems reviewed and are negative.  Physical Exam Updated Vital Signs BP (!) 135/58   Pulse 64   Temp 98.1 F (36.7 C) (Oral)   Resp (!) 21   SpO2 95%  Physical Exam Vitals and nursing note reviewed.  Constitutional:      General: He is not in acute distress.    Appearance: He is well-developed. He is not ill-appearing, toxic-appearing or diaphoretic.  HENT:     Head: Normocephalic and atraumatic.     Nose: Nose normal.     Mouth/Throat:     Mouth: Mucous membranes are moist.  Eyes:     Pupils: Pupils are equal, round, and reactive to light.  Cardiovascular:     Rate and Rhythm: Normal rate and regular rhythm.     Pulses: Normal pulses.          Radial pulses are 2+ on the right side and 2+ on the left side.       Dorsalis pedis pulses are 2+ on the right side and 2+ on the left side.     Heart sounds: Normal heart sounds.  Pulmonary:     Effort:  Pulmonary effort is normal. No respiratory distress.  Abdominal:     General: Bowel sounds are normal. There is no distension.     Palpations: Abdomen is soft.     Comments: Diffuse tenderness right flank, right lower abdomen into groin overlying skin changes, no obvious herniations.  Musculoskeletal:        General: Normal range of motion.     Cervical back: Normal range of motion and neck supple.     Comments: Diffuse tenderness midline lumbar region.  Significant pain with straight leg raise bilateral legs.  Able to flex and extend at bilateral knees, hips, ankles without difficulty  Skin:    General: Skin is warm and dry.     Capillary  Refill: Capillary refill takes less than 2 seconds.     Comments: No edema, erythema, warmth, rashes or lesions  Neurological:     General: No focal deficit present.     Mental Status: He is alert and oriented to person, place, and time.     Comments: Difficulty ambulating secondary to pain Intact sensation Equal strength bilaterally   ED Results / Procedures / Treatments   Labs (all labs ordered are listed, but only abnormal results are displayed) Labs Reviewed  CBC WITH DIFFERENTIAL/PLATELET - Abnormal; Notable for the following components:      Result Value   RBC 3.92 (*)    Hemoglobin 12.8 (*)    HCT 38.7 (*)    All other components within normal limits  COMPREHENSIVE METABOLIC PANEL - Abnormal; Notable for the following components:   CO2 20 (*)    Glucose, Bld 142 (*)    BUN 29 (*)    Creatinine, Ser 1.47 (*)    Total Protein 5.9 (*)    AST 13 (*)    Alkaline Phosphatase 34 (*)    GFR, Estimated 48 (*)    All other components within normal limits  LIPASE, BLOOD  URINALYSIS, ROUTINE W REFLEX MICROSCOPIC    EKG None  Radiology CT ABDOMEN PELVIS WO CONTRAST  Result Date: 01/01/2022 CLINICAL DATA:  Right lower quadrant abdominal and back pain. EXAM: CT ABDOMEN AND PELVIS WITHOUT CONTRAST TECHNIQUE: Multidetector CT imaging of the  abdomen and pelvis was performed following the standard protocol without IV contrast. RADIATION DOSE REDUCTION: This exam was performed according to the departmental dose-optimization program which includes automated exposure control, adjustment of the mA and/or kV according to patient size and/or use of iterative reconstruction technique. COMPARISON:  CT of the abdomen pelvis dated 09/01/2013. FINDINGS: Evaluation of this exam is limited in the absence of intravenous contrast. Lower chest: There are bibasilar atelectasis/scarring. There is coronary vascular calcification and calcification of the mitral annulus. No intra-abdominal free air or free fluid. Hepatobiliary: A 1.5 cm left hepatic cyst. The liver is otherwise unremarkable. No intrahepatic biliary dilatation. The gallbladder is unremarkable. Pancreas: Unremarkable. No pancreatic ductal dilatation or surrounding inflammatory changes. Spleen: Normal in size without focal abnormality. Adrenals/Urinary Tract: The adrenal glands are unremarkable. The kidneys, visualized ureters, and urinary bladder appear unremarkable. Stomach/Bowel: There is sigmoid diverticulosis without active inflammatory changes. There is no bowel obstruction or active inflammation. The appendix is not visualized, likely surgically absent. Vascular/Lymphatic: Advanced aortoiliac atherosclerotic disease. 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 Recommend follow-up ultrasound every 5 years. This recommendation follows ACR consensus guidelines: White Paper of the ACR Incidental Findings Committee II on Vascular Findings. J Am Coll Radiol 2013; 10:789-794. Reproductive: The prostate gland is poorly visualized due to streak artifact caused by bilateral hip arthroplasties. Other: None Musculoskeletal: Multilevel degenerative changes with lower lumbar disc spacer and posterior fusion. Bilateral total hip arthroplasties. No acute osseous pathology. IMPRESSION: 1.  No acute intra-abdominal or pelvic pathology. 2. Sigmoid diverticulosis. No bowel obstruction. 3. 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 4. Aortic Atherosclerosis (ICD10-I70.0). Electronically Signed   By: Anner Crete M.D.   On: 01/01/2022 22:19   CT L-SPINE NO CHARGE  Result Date: 01/01/2022 CLINICAL DATA:  Low back pain EXAM: CT LUMBAR SPINE WITHOUT CONTRAST TECHNIQUE: Multidetector CT imaging of the lumbar spine was performed without intravenous contrast administration. Multiplanar CT image reconstructions were also generated. RADIATION DOSE REDUCTION: This exam  was performed according to the departmental dose-optimization program which includes automated exposure control, adjustment of the mA and/or kV according to patient size and/or use of iterative reconstruction technique. COMPARISON:  11/21/2017 FINDINGS: Segmentation: 5 lumbar type vertebrae. Alignment: Grade 1 retrolisthesis at L2-3, L3-4 and L5-S1. Vertebrae: L4-5 PLIF with posterior decompression. Paraspinal and other soft tissues: Calcific aortic atherosclerosis. Disc levels: L1-2: No spinal canal stenosis. L2-3: No spinal canal stenosis. L3-4: Posterior decompression with widely patent spinal canal. L4-5: PLIF with widely patent spinal canal. L5-S1: Endplate spurring. Status post right laminectomy. No spinal canal stenosis. Mild bilateral foraminal stenosis. IMPRESSION: 1. L4-5 PLIF with posterior decompression. No spinal canal stenosis. 2. Mild bilateral L5-S1 foraminal stenosis. 3. Aortic Atherosclerosis (ICD10-I70.0). Electronically Signed   By: Ulyses Jarred M.D.   On: 01/01/2022 22:26    Procedures Procedures    Medications Ordered in ED Medications  lidocaine (LIDODERM) 5 % 1 patch (has no administration in time range)  morphine (PF) 4 MG/ML injection 4 mg (4 mg Intravenous Given 01/01/22 2019)  ondansetron (ZOFRAN) injection 4 mg (4 mg Intravenous Given 01/01/22 2019)  dexamethasone (DECADRON)  injection 10 mg (10 mg Intravenous Given 01/01/22 2020)  gabapentin (NEURONTIN) capsule 600 mg (600 mg Oral Given 01/01/22 2020)  HYDROmorphone (DILAUDID) injection 0.5 mg (0.5 mg Intravenous Given 01/01/22 2321)   ED Course/ Medical Decision Making/ A&P    81 year old multiple medical comorbidities including postlaminectomy syndrome followed by Dr. [name] with neurosurgery, COPD, CKD, hypertension, active bladder cancer here for evaluation of back pain which radiates into his right groin and abdomen.  Symptoms began earlier on the week.  Initially seen by PCP.  I reviewed his notes.  Thought to be sprain or strain.  Pain on resolved was seen by neurosurgery on Friday, Dr. [name].  Prescribed steroids.  Patient states he is still having significant pain, took old rx oxy 5 at home wo relief.  He denies any recent falls or injuries.  He has a nonfocal neuro exam without deficit.  Pain worse with ambulation.  We will plan on labs, imaging and reassess  Labs and imaging personally viewed and interpreted:  CBC without leukocytosis, hemoglobin 12.8 CMP glucose 142, creatinine 1.47 Lipase 25 UA neg for infection CT AP without acute abnormality  CT lumbar prior fusion without acute factors  Patient reassessed.  Pain improved, however still unable to get out of bed and ambulate due to pain.  I discussed his labs and imaging.  Requesting MR given Dr. [name] was going to be obtaining one.  Given still having significant pain will order MRI. Additional pain meds ordered.  Care transferred to [employer], [place] who follow-up on MRI.  Anticipate if MRI does not show any significant findings, patient ambulatory, neurovascularly intact and DC home with follow-up with neurosurgery outpatient                          Medical Decision Making Amount and/or Complexity of Data Reviewed Independent Historian: spouse External Data Reviewed: labs, radiology, ECG and notes. Labs: ordered. Decision-making details  documented in ED Course. Radiology: ordered and independent interpretation performed. Decision-making details documented in ED Course.  Risk OTC drugs. Prescription drug management. Parenteral controlled substances. Diagnosis or treatment significantly limited by social determinants of health.         Final Clinical Impression(s) / ED Diagnoses Final diagnoses:  Malignant neoplasm of urinary bladder, unspecified site [facility])  Acute right-sided low back pain with right-sided sciatica  Rx / DC Orders ED Discharge Orders     None         Reise Hietala A, PA-C 01/01/22 2357    Valarie Merino, MD 01/05/22 [phone]

## 2022-01-01 NOTE — ED Notes (Signed)
Pt endorses lower back pain. States he saw his surgeon yesterday and prescribed prednisone.

## 2022-01-02 ENCOUNTER — Emergency Department (HOSPITAL_COMMUNITY): Payer: PPO

## 2022-01-02 ENCOUNTER — Encounter (HOSPITAL_COMMUNITY): Payer: Self-pay | Admitting: Emergency Medicine

## 2022-01-02 DIAGNOSIS — M5126 Other intervertebral disc displacement, lumbar region: Secondary | ICD-10-CM | POA: Diagnosis not present

## 2022-01-02 MED ORDER — HYDROCODONE-ACETAMINOPHEN 5-325 MG PO TABS: 1.0000 | ORAL_TABLET | Freq: Four times a day (QID) | ORAL | 0 refills | Status: AC | PRN

## 2022-01-02 NOTE — ED Provider Notes (Signed)
Received at shift change from Shelby henderly PA-C please see her note for full detail  In short patient with medical history including post laminectomy active bladder cancer COPD CHF CKD presents with back pain lumbar midline radiates to right side worse to move improved with rest.  Followed by Dr. [name] of neurosurgery   Plan- Per previous provider follow-up on MRI, if unremarkable patient ambulatory can be discharged home.  Physical Exam  BP 128/68 (BP Location: Left Arm)   Pulse 63   Temp 98.1 F (36.7 C) (Oral)   Resp 14   SpO2 96%   Physical Exam Vitals and nursing note reviewed.  Constitutional:      General: He is not in acute distress.    Appearance: He is not ill-appearing.  HENT:     Head: Normocephalic and atraumatic.     Nose: No congestion.  Eyes:     Conjunctiva/sclera: Conjunctivae normal.  Cardiovascular:     Rate and Rhythm: Normal rate and regular rhythm.     Pulses: Normal pulses.  Pulmonary:     Breath sounds: Normal breath sounds.  Musculoskeletal:     Comments: Patient is able to walk under his own power without difficulty.  Skin:    General: Skin is warm and dry.  Neurological:     Mental Status: He is alert.  Psychiatric:        Mood and Affect: Mood normal.    Procedures  Procedures  ED Course / MDM    Medical Decision Making Amount and/or Complexity of Data Reviewed Labs: ordered. Radiology: ordered.  Risk Prescription drug management.   Lab work-which I personally reviewed and interpreted  CBC shows normocytic anemia hemoglobin 12.8, CMP shows CO2 of 20, glucose 142, BUN 29, creatinine 1.47, GFR 48, UA is unremarkable, lipase 25   Imaging-which I personally reviewed and interpreted CT lumbar spine, CT MRI both negative for acute findings.  CT abdomen pelvis negative for acute findings.  Reassessment-  Patient was found resting comfortably, having no complaints, updated on lab work and imaging, patient was ambulatory, him and  his wife are both agreeable to discharge at this time.  Disposition  Back pain-likely acute on chronic, will have him continue with the steroids, will give him a short course of narcotics, follow-up with neurosurgery for further evaluation and strict return precautions.       Marcello Fennel, PA-C 01/02/22 [id number]    Merrily Pew, MD 01/02/22 [phone]

## 2022-01-04 ENCOUNTER — Ambulatory Visit: Payer: PPO | Admitting: Urology

## 2022-01-04 DIAGNOSIS — C679 Malignant neoplasm of bladder, unspecified: Secondary | ICD-10-CM | POA: Diagnosis not present

## 2022-01-04 LAB — URINALYSIS, COMPLETE
Bilirubin, UA: NEGATIVE
Glucose, UA: NEGATIVE
Ketones, UA: NEGATIVE
Leukocytes,UA: NEGATIVE
Nitrite, UA: NEGATIVE
Protein,UA: NEGATIVE
RBC, UA: NEGATIVE
Specific Gravity, UA: 1.015 (ref 1.005–1.030)
Urobilinogen, Ur: 0.2 mg/dL (ref 0.2–1.0)
pH, UA: 5.5 (ref 5.0–7.5)

## 2022-01-04 LAB — MICROSCOPIC EXAMINATION: Bacteria, UA: NONE SEEN

## 2022-01-04 MED ORDER — BCG LIVE 50 MG IS SUSR
3.2400 mL | Freq: Once | INTRAVESICAL | Status: AC
  Administered 2022-01-04: 81 mg via INTRAVESICAL

## 2022-01-04 NOTE — Progress Notes (Signed)
Patient ID: [name], male   DOB: 1941/06/27, 81 y.o.   MRN: [id number] BCG Bladder Instillation  BCG # 5/6  Due to Bladder Cancer patient is present today for a BCG treatment. Patient was cleaned and prepped in a sterile fashion with betadine. A 14 FR catheter was inserted, urine return was noted 256m, urine was yellow in color.  54mof reconstituted BCG was instilled into the bladder. The catheter was then removed. Patient tolerated well, no complications were noted  Performed by: Armour Villanueva/ OkDarrick GrinderCMA  Follow up/ Additional notes: 1 week 01/11/2022 @ 10:30am

## 2022-01-05 DIAGNOSIS — J019 Acute sinusitis, unspecified: Secondary | ICD-10-CM | POA: Diagnosis not present

## 2022-01-05 DIAGNOSIS — H6982 Other specified disorders of Eustachian tube, left ear: Secondary | ICD-10-CM | POA: Diagnosis not present

## 2022-01-08 DIAGNOSIS — J449 Chronic obstructive pulmonary disease, unspecified: Secondary | ICD-10-CM | POA: Diagnosis not present

## 2022-01-08 DIAGNOSIS — M1710 Unilateral primary osteoarthritis, unspecified knee: Secondary | ICD-10-CM | POA: Diagnosis not present

## 2022-01-11 ENCOUNTER — Ambulatory Visit: Payer: PPO | Admitting: Physician Assistant

## 2022-01-11 ENCOUNTER — Encounter: Payer: Self-pay | Admitting: Physician Assistant

## 2022-01-11 DIAGNOSIS — C679 Malignant neoplasm of bladder, unspecified: Secondary | ICD-10-CM

## 2022-01-11 DIAGNOSIS — D494 Neoplasm of unspecified behavior of bladder: Secondary | ICD-10-CM

## 2022-01-11 DIAGNOSIS — N390 Urinary tract infection, site not specified: Secondary | ICD-10-CM | POA: Diagnosis not present

## 2022-01-11 MED ORDER — SULFAMETHOXAZOLE-TRIMETHOPRIM 800-160 MG PO TABS: 1.0000 | ORAL_TABLET | Freq: Two times a day (BID) | ORAL | 0 refills | Status: AC

## 2022-01-11 NOTE — Progress Notes (Signed)
Patient presented to the clinic today for a scheduled BCG instillation.  Urine grossly infected on UA, culture pending.  Patient reports dysuria.  Will treat for acute UTI today.  Prescription for Bactrim DS BID x7 days sent to pharmacy.  Patient to return to clinic next week for next scheduled BCG treatment.  We will add an additional treatment to his scheduled series to make up for today's missed instillation.  Debroah Loop, PA-C 01/11/22 10:44 AM   I spent 10 minutes on the day of the encounter to include pre-visit record review, face-to-face time with the patient, and post-visit ordering of tests.

## 2022-01-12 ENCOUNTER — Telehealth: Payer: Self-pay | Admitting: Cardiovascular Disease

## 2022-01-12 LAB — MICROSCOPIC EXAMINATION

## 2022-01-12 LAB — URINALYSIS, COMPLETE
Bilirubin, UA: NEGATIVE
Glucose, UA: NEGATIVE
Ketones, UA: NEGATIVE
Nitrite, UA: POSITIVE — AB
Protein,UA: NEGATIVE
Specific Gravity, UA: 1.01 (ref 1.005–1.030)
Urobilinogen, Ur: 0.2 mg/dL (ref 0.2–1.0)
pH, UA: 6 (ref 5.0–7.5)

## 2022-01-12 NOTE — Telephone Encounter (Signed)
  Pt c/o swelling: STAT is pt has developed SOB within 24 hours  If swelling, where is the swelling located? Left foot   How much weight have you gained and in what time span? Not sure  Have you gained 3 pounds in a day or 5 pounds in a week? Not sure  Do you have a log of your daily weights (if so, list)? Not sure  Are you currently taking a fluid pill? Yes   Are you currently SOB? No   Have you traveled recently? No   3 days ago cant hardly walk and today it just swelling. He said, his weighing scale was moved so he is not sure if he gained weight. He said, he just feel fatigue and concern about his left foot swelling

## 2022-01-12 NOTE — Telephone Encounter (Signed)
Called and spoke with patient. Patient reports that about 4 days ago he began to have pain in his left heel and ankle. The next day, he noticed swelling in his left foot that has been getting progressively worse. Reports that the pain stopped when the swelling started.   Patient denies any warmth or redness to the calf or foot. No pain in the calf.   Also endorses some abd swelling. Reports that he has missed 3 days of weighing due to being unable to find his scale.   Pt offered appt for tomorrow, 6/1 with Cadence Kathlen Mody, PA-C at 10:30. Pt accepted.

## 2022-01-13 ENCOUNTER — Ambulatory Visit
Admission: RE | Admit: 2022-01-13 | Discharge: 2022-01-13 | Disposition: A | Discharge status: Home / Self Care | Payer: PPO | Source: Ambulatory Visit | Attending: Medical | Admitting: Medical

## 2022-01-13 ENCOUNTER — Ambulatory Visit: Payer: PPO | Admitting: Medical

## 2022-01-13 ENCOUNTER — Encounter: Payer: Self-pay | Admitting: Medical

## 2022-01-13 VITALS — BP 104/44 | HR 84 | Ht 72.0 in | Wt 220.4 lb

## 2022-01-13 DIAGNOSIS — E785 Hyperlipidemia, unspecified: Secondary | ICD-10-CM | POA: Diagnosis not present

## 2022-01-13 DIAGNOSIS — N183 Chronic kidney disease, stage 3 unspecified: Secondary | ICD-10-CM | POA: Diagnosis not present

## 2022-01-13 DIAGNOSIS — I5022 Chronic systolic (congestive) heart failure: Secondary | ICD-10-CM | POA: Diagnosis not present

## 2022-01-13 DIAGNOSIS — I10 Essential (primary) hypertension: Secondary | ICD-10-CM | POA: Diagnosis not present

## 2022-01-13 DIAGNOSIS — I25118 Atherosclerotic heart disease of native coronary artery with other forms of angina pectoris: Secondary | ICD-10-CM | POA: Diagnosis not present

## 2022-01-13 DIAGNOSIS — M7989 Other specified soft tissue disorders: Secondary | ICD-10-CM | POA: Diagnosis not present

## 2022-01-13 DIAGNOSIS — I255 Ischemic cardiomyopathy: Secondary | ICD-10-CM

## 2022-01-13 DIAGNOSIS — R609 Edema, unspecified: Secondary | ICD-10-CM

## 2022-01-13 DIAGNOSIS — R2242 Localized swelling, mass and lump, left lower limb: Secondary | ICD-10-CM | POA: Diagnosis not present

## 2022-01-13 MED ORDER — ISOSORBIDE MONONITRATE ER 30 MG PO TB24: 15.0000 mg | ORAL_TABLET | Freq: Every day | ORAL | 3 refills | Status: DC

## 2022-01-13 NOTE — Patient Instructions (Addendum)
Medication Instructions:   Your physician has recommended you make the following change in your medication:   DECREASE Imdur to 15 mg daily   *If you need a refill on your cardiac medications before your next appointment, please call your pharmacy*   Lab Work: None ordered  If you have labs (blood work) drawn today and your tests are completely normal, you will receive your results only by: [place] (if you have MyChart) OR A paper copy in the mail If you have any lab test that is abnormal or we need to change your treatment, we will call you to review the results.   Testing/Procedures:  Please proceed to the hospital to have a lower extremity ultrasound on the left lower leg to rule out DVT.   Follow-Up: At [facility], you and your health needs are our priority.  As part of our continuing mission to provide you with exceptional heart care, we have created designated Provider Care Teams.  These Care Teams include your primary Cardiologist (physician) and Advanced Practice Providers (APPs -  Physician Assistants and Nurse Practitioners) who all work together to provide you with the care you need, when you need it.  We recommend signing up for the patient portal called "MyChart".  Sign up information is provided on this After Visit Summary.  MyChart is used to connect with patients for Virtual Visits (Telemedicine).  Patients are able to view lab/test results, encounter notes, upcoming appointments, etc.  Non-urgent messages can be sent to your provider as well.   To learn more about what you can do with MyChart, go to [email or web address].    Your next appointment:   2 month(s)  The format for your next appointment:   In Person  Provider:   You may see Ida Rogue, MD or one of the following Advanced Practice Providers on your designated Care Team:   Murray Hodgkins, NP Christell Faith, PA-C Cadence Kathlen Mody, [place]   Other Instructions [name]  Important Information  About [place]

## 2022-01-13 NOTE — Progress Notes (Unsigned)
Cardiology Office Note:    Date:  01/15/2022   ID:  ZAEVION PARKE, DOB 01/18/1941, MRN [id number]  PCP:  Ria Bush, MD  [facility] Cardiologist:  Ida Rogue, MD  [facility] Electrophysiologist:  None   Referring MD: Ria Bush, MD   Chief Complaint: lower leg swelling  History of Present Illness:    LORENE SAMAAN is a 81 y.o. male with a hx of CAD status post remote CABG x2 in 1990, peripheral arterial disease with claudication, hypertension, hyperlipidemia, spinal stenosis with chronic lower extremity pain and neuropathy, BPH, GERD, and sleep apnea who presents for swelling   H/o CAD status post MI and CABG x2 in 1990.  This was followed by bare-metal stenting to the left circumflex in 2002.  Subsequent catheterization in November 2016, showed 2 of 2 patent grafts with patent circumflex stent.  He has known small vessel and diffuse right coronary artery disease, which has been medically managed with long-acting nitrate therapy.  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  In January 2020, he underwent lower extremity arterial angiography in the setting of ongoing lower extremity pain and abnormal duplex.  This showed diffuse and calcified bilateral SFA disease with three-vessel runoff below the knee bilaterally.  Medical therapy was recommended.  In October 2020, he was admitted with COVID-19.  Echo showed new LV dysfunction with an EF of 35 to 40%.  Follow-up echo in early 2021 showed improvement to 50-55%.  He had a low risk stress test in October 2021, which was performed prior to back surgery.  In August 2022, he complained of chest tightness.  Echocardiogram showed an EF of 50 to 55% with normal RV function and RVSP of 44.7 mmHg.  Low-dose Lasix was added as needed.  He subsequently underwent stress testing in September 2022 in the setting of  admission for unstable angina and dyspnea, and this was low risk without evidence of ischemia. BB was discontinued for bradycardia.   Last seen 08/02/21 and was overall doing well.  Today, the patient reports lower leg swelling, worse on the left foot. Reports it started 1 week ago. He takes lasix every other day. He denies chest pain or significant SOB. He took his BP medications today. On exam left foot is swollen. He generally feels tired today and weak, BP is soft.   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 06/2019 Echo (in setting of COVID): EF 35-40%, mild LVH, g1 DD, glob HK; b. 09/2019 Echo: EF 50-55%, Gr1 DD; c. 03/2021 Echo: EF 50-55%, no rwma, mild LVH, GrI DD, nl RV fxn. RVSP 44.98mHg. Mild-mod dil LA. Triv MR. Mild-mod Ao sclerosis.   AAA (abdominal aortic aneurysm) (HCC)    Actinic keratosis    [place]    BENIGN PROSTATIC HYPERTROPHY, WITH URINARY OBSTRUCTION 09/06/2007   CAD s/p CABG    a. 1990 s/p MI-->CABG x 2; b. 2002 s/p BMS to LCX; c. 06/2015 Cath: LM 50ost, LAD 100ost/p, 62m, D2 nl, LCX  patent stent, RCA 80p (small), LIMA->LAD nl, VG->D2 nl-->Med Rx; d. 05/2020 MV: fixed apical ant/apical defect. No signif ischemia; e. 04/2021 MV: EF 52%, no ischemia.   [place]   a. 08/2016 Carotid U/S: <39% bilat.   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 did not tolerate CPAP.   Osteoarthritis    PAD (peripheral artery disease) ([place])    a. 07/2017 LE duplex: RSFA 75-34m LSFA 75-935m50-74d; c. 08/2018 Periph Angio: No signif AoIliac dzs. Mod-sev Ca2+ RSFA w/ diff dzs throughout- 3 vessel runoff. Borderline signif LSFA dzs w/ mod-sev Ca2+ vessels and 3 vessel runoff below the knee-->Med rx.   Pneumonia 07/2014   [facility] hospitalization   Pneumonia due to COVID-19 virus 06/02/2019   Pre-diabetes    Prostatitis, chronic    PSA, INCREASED 07/09/2008   Spinal stenosis of lumbar region     Past Surgical History:  Procedure Laterality Date   ABDOMINAL AORTOGRAM W/LOWER EXTREMITY [name] 09/12/2018   Procedure: ABDOMINAL AORTOGRAM W/LOWER EXTREMITY;  Surgeon: ArWellington HampshireMD;  [facility];  Service: Cardiovascular;  Laterality: [name];   APPENDECTOMY     rupture   BACK SURGERY     1984 and then [facility]   BLADDER INSTILLATION [name] 10/19/2021   Procedure: BLADDER INSTILLATION OF GEMCITABINE;  Surgeon: StAbbie SonsMD;  Location: [facility] ORS;  Service: Urology;  Laterality: [name];   CARDIAC CATHETERIZATION [name] 07/07/2015   Procedure: Left Heart Cath and Cors/Grafts Angiography;  Surgeon: TiMinna MerrittsMD;  [facility];  Service: Cardiovascular;  Laterality: [name];   CATARACT EXTRACTION, BILATERAL  2009   CERVICAL SPINE SURGERY  12/2016   cervical stenosis (JArnoldo [name]  COLONOSCOPY  03/2010   HP polyp, diverticulosis, rpt 10 yrs ([name])   CORONARY ANGIOPLASTY  11/30/2000   LCX   CORONARY ARTERY BYPASS GRAFT  1990   3 vessel    CYSTOSCOPY  12/23/2010   [facility]   EYE SURGERY     cataract   JOINT REPLACEMENT     KNEE ARTHROSCOPY Right    x2   LAMINOTOMY  1986   L5/S1 lumbar laminotomy for two ruptured discs/fusion   LUMBAR  LAMINECTOMY/DECOMPRESSION MICRODISCECTOMY [name] 07/13/2016   Procedure: LUMBAR TWO-THREE, LUMBAR THREE-FOUR, LUMBAR FOUR-FIVE LAMINECTOMY AND FORAMINOTOMY;  Surgeon: JeNewman PiesMD;  [facility];  Laterality: [name];  LAMINECTOMY AND FORAMINOTOMY L2-L3, L3-L4,L4-L5   LUMBAR SPINE SURGERY  06/2020   Bilateral redo laminectomy/laminotomy/foraminotomies/medial facetectomy to decompress the bilateral L4 and L5 nerve roots (JArnoldo [name]  TOTAL HIP ARTHROPLASTY Bilateral [id number] TOTAL KNEE ARTHROPLASTY Right 03/06/2017   Procedure: RIGHT TOTAL KNEE ARTHROPLASTY;  Surgeon: AlGaynelle ArabianMD;  [facility];  Service: Orthopedics;  Laterality: Right;   TRANSURETHRAL RESECTION OF BLADDER TUMOR [name] 10/19/2021   Procedure: TRANSURETHRAL RESECTION OF BLADDER TUMOR (TURBT);  Surgeon: StAbbie SonsMD;  Location: [facility] ORS;  Service: Urology;  Laterality: [name];    Current Medications: Current  Meds  Medication Sig   acetaminophen (TYLENOL) 500 MG tablet Take 1,000 mg by mouth every 8 (eight) hours as needed for moderate pain.   aspirin EC 81 MG EC tablet Take 1 tablet (81 mg total) by mouth daily.   atorvastatin (LIPITOR) 20 MG tablet TAKE 1 TABLET BY MOUTH DAILY.   Cyanocobalamin (B-12) 1000 MCG SUBL Place 1 tablet under the tongue daily.   cyclobenzaprine (FLEXERIL) 5 MG tablet Take 1 tablet (5 mg total) by mouth 2 (two) times daily as needed for muscle spasms (sedation precautions).   docusate sodium (COLACE) 100 MG capsule Take 100 mg by mouth 2 (two) times daily as needed for mild constipation.   ezetimibe (ZETIA) 10 MG tablet TAKE 1 TABLET BY MOUTH ONCE DAILY. (Patient taking differently: Take 10 mg by mouth at bedtime.)   fluticasone (FLONASE) 50 MCG/ACT nasal spray Place 1 spray into both nostrils as needed for allergies or rhinitis.   furosemide (LASIX) 20 MG tablet Take 20 mg by mouth every other day.   gabapentin (NEURONTIN) 600 MG tablet Take 600 mg by mouth 3  (three) times daily.   ipratropium-albuterol (DUONEB) 0.5-2.5 (3) MG/3ML SOLN Take 3 mLs by nebulization every 6 (six) hours as needed.   losartan (COZAAR) 25 MG tablet TAKE 1 TABLET BY MOUTH DAILY   Nebulizers (COMPRESSOR/NEBULIZER) MISC Use as directed.   nitroGLYCERIN (NITROSTAT) 0.4 MG SL tablet Place 1 tablet (0.4 mg total) under the tongue every 5 (five) minutes as needed for chest pain.   OVER THE COUNTER MEDICATION Apply 1 application topically 2 (two) times daily as needed (pain). Cryotherapy roll on gel   OXYGEN Inhale 2 L into the lungs at bedtime.   pantoprazole (PROTONIX) 40 MG tablet Take 40 mg by mouth 2 (two) times daily.   polyethylene glycol (MIRALAX / GLYCOLAX) 17 g packet Take 17 g by mouth daily as needed for mild constipation.    PROAIR HFA 108 (90 Base) MCG/ACT inhaler TAKE 2 PUFFS INTO LUNGS EVERY 6 HOURS ASNEEDED FOR WHEEZING OR SHORTNESS OF BREATH   ranolazine (RANEXA) 500 MG 12 hr tablet Take 1 tablet (500 mg total) by mouth 2 (two) times daily.   sulfamethoxazole-trimethoprim (BACTRIM DS) 800-160 MG tablet Take 1 tablet by mouth 2 (two) times daily for 7 days.   tamsulosin (FLOMAX) 0.4 MG CAPS capsule Take 0.4 mg by mouth at bedtime.   tolterodine (DETROL LA) 4 MG 24 hr capsule TAKE 1 CAPSULE EVERY DAY   [DISCONTINUED] isosorbide mononitrate (IMDUR) 30 MG 24 hr tablet Take 1 tablet (30 mg total) by mouth daily.     Allergies:   Vioxx [rofecoxib], Spiriva respimat [tiotropium bromide monohydrate], Contrast media [iodinated contrast media], and Morphine   Social History   Socioeconomic History   Marital status: Married    Spouse name: [name]   Number of children: 1   Years of education: Not on file   Highest education level: Not on file  Occupational History   Not on file  Tobacco Use   Smoking status: Former    Packs/day: 1.00    Years: 25.00    Pack years: 25.00    Types: Cigarettes    Quit date: 08/15/1988    Years since quitting: 33.4   Smokeless  tobacco: Never  Vaping Use   Vaping Use: Never used  Substance and Sexual Activity   Alcohol use: No    Alcohol/week: 0.0 standard drinks   Drug use: No   Sexual activity: Yes  Other  Topics Concern   Not on file  Social History Narrative   Married, wife [name], for 25 years in 2016. 1 daughter and 3 grandkids from first marriage.    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    Diet: good water, fruits/vegetables daily      OSA eval recommended while hospitalized with Covid 05/2019   Social Determinants of Health   Financial Resource Strain: Low Risk    Difficulty of Paying Living Expenses: Not hard at all  Food Insecurity: No Food Insecurity   Worried About Charity fundraiser in the Last Year: Never true   Ran Out of Food in the Last Year: Never true  Transportation Needs: No Transportation Needs   Lack of Transportation (Medical): No   Lack of Transportation (Non-Medical): No  Physical Activity: Inactive   Days of Exercise per Week: 0 days   Minutes of Exercise per Session: 0 min  Stress: No Stress Concern Present   Feeling of Stress : Not at all  Social Connections: Not on file     Family History: The patient's family history includes Cancer in his sister and another family member; Diabetes in his brother and mother; Heart attack in his mother; Heart disease in his father; Kidney disease in his father and sister; Stroke in his father and mother.  ROS:   Please see the history of present illness.     All other systems reviewed and are negative.  EKGs/Labs/Other Studies Reviewed:    The following studies were reviewed today:  Myoview Lexiscan 04/2021 Narrative & Impression      No ST deviation was noted.   Pharmacological myocardial perfusion imaging study with no significant  ischemia Normal wall motion, EF estimated at 52% No  EKG changes concerning for ischemia at peak stress or in recovery. Low risk scan    Echo 03/2021  1. Left ventricular ejection fraction, by estimation, is 50 to 55%. The  left ventricle has low normal function. The left ventricle has no regional  wall motion abnormalities. There is mild left ventricular hypertrophy.  Left ventricular diastolic  parameters are consistent with Grade I diastolic dysfunction (impaired  relaxation).   2. Right ventricular systolic function is normal. The right ventricular  size is normal. There is mildly elevated pulmonary artery systolic  pressure. The estimated right ventricular systolic pressure is 35.0 mmHg.   3. Left atrial size was mild to moderately dilated.   4. The mitral valve is normal in structure. Trivial mitral valve  regurgitation. No evidence of mitral stenosis.   5. The aortic valve is normal in structure. Aortic valve regurgitation is  not visualized. Mild to moderate aortic valve sclerosis/calcification is  present, without any evidence of aortic stenosis.   EKG:  EKG is  ordered today.  The ekg ordered today demonstrates NSR 84bpm, LAD, LBBB, nonspecific T wave changes, no change from prior  Recent Labs: 02/16/2021: TSH 2.77 07/08/2021: B Natriuretic Peptide 507.2 01/01/2022: ALT 14; BUN 29; Creatinine, Ser 1.47; Hemoglobin 12.8; Platelets 159; Potassium 4.4; Sodium 138  Recent Lipid Panel    Component Value Date/Time   CHOL 114 02/16/2021 0911   TRIG 92.0 02/16/2021 0911   HDL 33.40 (L) 02/16/2021 0911   CHOLHDL 3 02/16/2021 0911   VLDL 18.4 02/16/2021 0911   LDLCALC 62 02/16/2021 0911   LDLDIRECT 106.0 01/01/2016 0844  Physical Exam:    VS:  BP (!) 104/44 (BP Location: Left Arm, Patient Position: Sitting, Cuff Size: Normal)   Pulse 84   Ht 6' (1.829 m)   Wt 220 lb 6 oz (100 kg)   SpO2 93%   BMI 29.89 kg/m     Wt Readings from Last 3 Encounters:  01/13/22 220 lb 6 oz (100 kg)  12/29/21 219 lb 2 oz (99.4 kg)  12/20/21  220 lb 9.6 oz (100.1 kg)     GEN:  Well nourished, well developed in no acute distress HEENT: Normal NECK: No JVD; No carotid bruits LYMPHATICS: No lymphadenopathy CARDIAC: RRR, no murmurs, rubs, gallops RESPIRATORY:  Clear to auscultation without rales, wheezing or rhonchi  ABDOMEN: Soft, non-tender, non-distended MUSCULOSKELETAL:  No edema; No deformity  SKIN: Warm and dry NEUROLOGIC:  Alert and oriented x 3 PSYCHIATRIC:  Normal affect   ASSESSMENT:    1. Localized swelling of left lower extremity   2. Swelling   3. Coronary artery disease of native artery of native heart with stable angina pectoris ([place])   4. Hyperlipidemia LDL goal <70   5. Ischemic cardiomyopathy   6. Chronic systolic heart failure ([place])   7. Essential hypertension   8. Stage 3 chronic kidney disease, unspecified whether stage 3a or 3b CKD (HCC)    PLAN:    In order of problems listed above:  Lower extremity edema Left sided swelling of the foot for the last week. No pain or tenderness in the calf. Heart rate and O2 are normal. I will order a STAT DVT study.   Chronic heart failure with improved EF/ICM Patient denies chest pain or SOB. Most recent echo in August 2022 showed EF 50-55%. He is taking lasix '20mg'$  every other day. If DVT is negative, can take an extra lasix PRN for swelling. Continue Losartan '25mg'$  daily. GDMT is limited by BP and kidney function.  Stable angina/CAD s/p remote CABG No anginal symptoms reported. No further ischemic work-up indicated today. Continue Imdur, Ranexa, Zetia, Lipitor, and Aspirin.   HTN BP is soft, 104/44 and he is feeling weak. I will decrease Imdur to '15mg'$  daily.   HLD LDL 61 in 02/2021. Continue Lipitor and Zetia.   CKD stage 3 Most recent labs are stable Scr 1.47, BUN 29  Disposition: Follow up in 2 month(s) with MD/APP    Signed, Rogene Meth Ninfa Meeker, PA-C  01/15/2022 10:47 AM    [place]

## 2022-01-15 DIAGNOSIS — R0683 Snoring: Secondary | ICD-10-CM | POA: Diagnosis not present

## 2022-01-15 DIAGNOSIS — I10 Essential (primary) hypertension: Secondary | ICD-10-CM | POA: Diagnosis not present

## 2022-01-15 DIAGNOSIS — G4733 Obstructive sleep apnea (adult) (pediatric): Secondary | ICD-10-CM | POA: Diagnosis not present

## 2022-01-16 LAB — CULTURE, URINE COMPREHENSIVE

## 2022-01-17 ENCOUNTER — Other Ambulatory Visit: Payer: Self-pay | Admitting: Cardiovascular Disease

## 2022-01-18 ENCOUNTER — Encounter: Payer: Self-pay | Admitting: Cardiovascular Disease

## 2022-01-18 ENCOUNTER — Encounter: Payer: Self-pay | Admitting: Family Medicine

## 2022-01-18 ENCOUNTER — Ambulatory Visit: Payer: PPO | Admitting: Physician Assistant

## 2022-01-18 DIAGNOSIS — M545 Low back pain, unspecified: Secondary | ICD-10-CM | POA: Diagnosis not present

## 2022-01-18 DIAGNOSIS — Z6829 Body mass index (BMI) 29.0-29.9, adult: Secondary | ICD-10-CM | POA: Diagnosis not present

## 2022-01-18 NOTE — Telephone Encounter (Signed)
Spoke with pt to get clarification of message.  Says message was sent to Dr. Darnell Level in error.  He sent message to Dr. Rockey Situ about low BP readings.  States Dr. Rockey Situ has already responded but pt expresses his thanks for the call.

## 2022-01-20 ENCOUNTER — Ambulatory Visit: Payer: PPO | Admitting: Physician Assistant

## 2022-01-20 VITALS — Temp 97.4°F

## 2022-01-20 DIAGNOSIS — Z6828 Body mass index (BMI) 28.0-28.9, adult: Secondary | ICD-10-CM | POA: Diagnosis not present

## 2022-01-20 DIAGNOSIS — J449 Chronic obstructive pulmonary disease, unspecified: Secondary | ICD-10-CM | POA: Diagnosis not present

## 2022-01-20 DIAGNOSIS — D494 Neoplasm of unspecified behavior of bladder: Secondary | ICD-10-CM

## 2022-01-20 DIAGNOSIS — G629 Polyneuropathy, unspecified: Secondary | ICD-10-CM | POA: Diagnosis not present

## 2022-01-20 DIAGNOSIS — D692 Other nonthrombocytopenic purpura: Secondary | ICD-10-CM | POA: Diagnosis not present

## 2022-01-20 DIAGNOSIS — Z7982 Long term (current) use of aspirin: Secondary | ICD-10-CM | POA: Diagnosis not present

## 2022-01-20 DIAGNOSIS — Z7951 Long term (current) use of inhaled steroids: Secondary | ICD-10-CM | POA: Diagnosis not present

## 2022-01-20 DIAGNOSIS — I509 Heart failure, unspecified: Secondary | ICD-10-CM | POA: Diagnosis not present

## 2022-01-20 DIAGNOSIS — Z9889 Other specified postprocedural states: Secondary | ICD-10-CM | POA: Diagnosis not present

## 2022-01-20 DIAGNOSIS — G4733 Obstructive sleep apnea (adult) (pediatric): Secondary | ICD-10-CM | POA: Diagnosis not present

## 2022-01-20 DIAGNOSIS — C679 Malignant neoplasm of bladder, unspecified: Secondary | ICD-10-CM | POA: Diagnosis not present

## 2022-01-20 MED ORDER — BCG LIVE 50 MG IS SUSR
3.2400 mL | Freq: Once | INTRAVESICAL | Status: AC
  Administered 2022-01-20: 81 mg via INTRAVESICAL

## 2022-01-20 NOTE — Patient Instructions (Signed)
Your Timeline for Today:  Right now through 5:30pm: Hold your urine and do your quarter turns every 15 minutes. 5:30pm-11:30pm today: Every time you urinate, pour 1/2 cup of bleach into the toilet and let it sit for 15 minutes prior to flushing. 11:30pm onward: Resume your normal routine.

## 2022-01-20 NOTE — Progress Notes (Signed)
BCG Bladder Instillation  BCG # 6 of 6  Due to Bladder Cancer patient is present today for a BCG treatment. Patient was cleaned and prepped in a sterile fashion with betadine. A 14FR catheter was inserted, urine return was noted 2103m, urine was yellow in color.  54mof reconstituted BCG was instilled into the bladder. The catheter was then removed. Patient tolerated well, no complications were noted  Performed by: SaDebroah LoopPA-C and   Additional notes: Patient completed 7 days of culture appropriate Bactrim. His dysuria has resolved. He is not circumcised. In the absence of clinical infection today, will proceed with treatment despite pyuria and bacteriuria on urine microscopy. Will repeat culture today in case he becomes symptomatic.  Follow up: Return in about 3 months (around 04/22/2022) for Cysto with Dr. StBernardo Heater

## 2022-01-21 ENCOUNTER — Telehealth: Payer: Self-pay

## 2022-01-21 LAB — URINALYSIS, COMPLETE
Bilirubin, UA: NEGATIVE
Glucose, UA: NEGATIVE
Ketones, UA: NEGATIVE
Nitrite, UA: NEGATIVE
Specific Gravity, UA: 1.015 (ref 1.005–1.030)
Urobilinogen, Ur: 0.2 mg/dL (ref 0.2–1.0)
pH, UA: 5 (ref 5.0–7.5)

## 2022-01-21 LAB — MICROSCOPIC EXAMINATION: WBC, UA: >30 /hpf — AB (ref 0–5)

## 2022-01-21 MED ORDER — CEFUROXIME AXETIL 250 MG PO TABS: 250.0000 mg | ORAL_TABLET | Freq: Two times a day (BID) | ORAL | 0 refills | Status: DC

## 2022-01-21 NOTE — Telephone Encounter (Signed)
Please start cefuroxime '250mg'$  BID x7 days.

## 2022-01-21 NOTE — Telephone Encounter (Signed)
.  left message to have patient return my call. rx sent to pharmacy by e-script, [facility]

## 2022-01-21 NOTE — Telephone Encounter (Signed)
Pt LM on triage line stating that he feels that he still has a UTI. Called pt back, he states that he is having dysuria, denies fever or chills, also denies hematuria. He questions if he should begin treatment or wait for urine cx. Please advise.

## 2022-01-21 NOTE — Telephone Encounter (Signed)
Spoke with patient and advised results   

## 2022-01-23 LAB — CULTURE, URINE COMPREHENSIVE

## 2022-01-25 ENCOUNTER — Other Ambulatory Visit: Payer: Self-pay | Admitting: *Deleted

## 2022-01-25 MED ORDER — CIPROFLOXACIN HCL 250 MG PO TABS: 250.0000 mg | ORAL_TABLET | Freq: Two times a day (BID) | ORAL | 0 refills | Status: AC

## 2022-01-31 NOTE — Progress Notes (Unsigned)
Cardiology Office Note  Date:  02/01/2022   ID:  [name], DOB 1940/08/29, MRN [id number]  PCP:  Ria Bush, MD   Chief Complaint  Patient presents with   2 month follow up     Patient c/o chest pain last week, bilateral LE edema, mild dizziness and shortness of breath. Medications reviewed by the patient verbally.      HPI:  81 year old gentleman with hx of OSA, did not tolerate CPAP mild  bilateral carotid arterial disease  <39% bilaterally 08/2016 hyperlipidemia  coronary artery disease,  bypass in 1990,  stenting to the circumflex in 2002,  Last cath 06/2015, patent grafts x 2, RCA small vessel diffuse disease DVT in the left lower extremity,  Completed back surgery for spinal stenosis, 06/20/2016 Cervical neck surgery 01/2017, [name]  total knee surgery in 02/2017 covid 05/20/2019 EF was 35 to 40% in 06/2019 Repeat echo February 2021  ejection fraction 50 to 55% who presents for routine followup  Of his coronary artery disease, post  Covid  Last seen by myself in clinic December 2022 Bladder cancer, completed 6 treatments Chest pain last week, better with NTG SL Does not have chest pain on a regular basis, this was a rare occasion but reports symptoms were pretty heavy lasting 10 minutes  No regular exercise program, limited by arthritides  Seen by one of our providers 01/13/2022 Leg swelling at that time, unilateral, better now Imdur was changed down to 15 for borderline low blood pressure He denies any orthostasis symptoms  On CPAP with oxygen, broke at 4 Am last night Been awake since then Has some stuffed up sinuses  Tore hamstring, slow to heal Back pain, followed by Dr. [name]  No regular exercise program  Labs reviewed Cr 1.47, stable, on lasix QOD A1C 6.4 Total chol 114, LDL 62  EKG personally reviewed by myself on todays visit Nsr rate 85 bpm LAD  Echo 8/22 reviewed  1. Left ventricular ejection fraction, by estimation, is  50 to 55%. The  left ventricle has low normal function. The left ventricle has no regional  wall motion abnormalities. There is mild left ventricular hypertrophy.  Left ventricular diastolic  parameters are consistent with Grade I diastolic dysfunction (impaired  relaxation).   2. Right ventricular systolic function is normal. The right ventricular  size is normal. There is mildly elevated pulmonary artery systolic  pressure. The estimated right ventricular systolic pressure is 58.8 mmHg.   3. Left atrial size was mild to moderately dilated.   4. The mitral valve is normal in structure. Trivial mitral valve  regurgitation. No evidence of mitral stenosis.   5. The aortic valve is normal in structure. Aortic valve regurgitation is  not visualized. Mild to moderate aortic valve sclerosis/calcification is  present, without any evidence of aortic stenosis.   Stress test 04/2021, results discussed Pharmacological myocardial perfusion imaging study with no significant  ischemia Normal wall motion, EF estimated at 52% No EKG changes concerning for ischemia at peak stress or in recovery. Low risk scan  Other recent events,  Stress test September 2022 Low risk, probably normal pharmacologic myocardial perfusion stress test.  LE arterial hx, reviewed today 2.  Right lower extremity: Moderately to severely calcified SFA with diffuse disease throughout its course especially in the midsegment and three-vessel runoff below the knee. 3.  Left lower extremity: Borderline significant SFA disease which is moderately to severely calcified and three-vessel runoff below the knee.  pulmonary appointment sept, 2021  postinflammatory pulmonary issues post Covid COPD poorly compensated Changes to  his inhalers made  CT chest 06/2019 Diffuse multifocal areas of consolidation and ground-glass opacity throughout the lungs compatible with atypical infection in the setting of known COVID positivity.  Stress  test 03/2019 Pharmacological myocardial perfusion imaging study with no significant ischemia Small region mild fixed perfusion defect in the distal anteroseptal and apical region, unable to exclude old scar vs attenuation artifact EF estimated at 38%, septal wall hypokinesis, possibly secondary to CABG/post-operative state.  No EKG changes concerning for ischemia at peak stress or in recovery. Low risk scan  05/2019 COVID , and hospital 10 days treated with remdesivir and steroids, antibodies little bit of leg swelling but he attributed to not taking diuretics   Back in the hospital again for an additional 10-day stay October 28  he started to run another fever up to 101.3 has not had any chest pain or wheezing no headaches. blood pressure has been  running low in the 90s so he stopped using his lisinopril.  His blood pressure after that improved to 130s over 60s  CT chest,  . Diffuse multifocal areas of consolidation and ground-glass opacity throughout the lungs compatible with atypical infection in the setting of known COVID positivity.  LE arterial 08/2018,   Right lower extremity: Moderately to severely calcified SFA with diffuse disease throughout its course especially in the midsegment and three-vessel runoff below the knee.   AAA less than 3 cm, on scan in 2018 reviewed with him Numerous previous episode of chest pain relieved with belching consistent with GI etiology.  Previous cardiac catheterization 07/07/2015            Ost LM to LM lesion, 50% stenosed.            Ost RCA lesion, 80% stenosed.            Prox RCA-1 lesion, 80% stenosed.            Prox RCA-2 lesion, 80% stenosed.            LIMA was injected is moderate in size, and is anatomically normal.            Dist LAD lesion, 60% stenosed.            Ost LAD to Prox LAD lesion, 100% stenosed.            Mid LAD lesion, 60% stenosed.            The left ventricular systolic function is normal.             SVG was injected is normal in caliber, and is anatomically normal.            2nd Diag lesion, 50% stenosed.    severe RCA disease (small vessel) not amenable to PCI, moderate left main disease, moderate LAD and proximal diagonal disease. 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), CKD (chronic kidney disease), stage III ([place]), Community acquired pneumonia of right lower lobe of lung (06/05/2017), COVID-19 virus infection (05/30/2019), DUPUYTREN'S CONTRACTURE, 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), DVT, HX OF (1998), Emphysema of lung ([place]), GERD (04/30/2007), Headache, History of hiatal  hernia, History of shingles, HYPERLIPIDEMIA (04/26/2007), HYPERTENSION (04/30/2007), INGUINAL HERNIA, RIGHT (05/26/2010), Laceration of skin of left hand (03/08/2018), Lumbar disc disease with radiculopathy, Myocardial infarction ([place]), OSA (obstructive sleep apnea), Osteoarthritis, PAD (peripheral artery disease) ([place]), Pneumonia (07/2014), Pneumonia due to COVID-19 virus (06/02/2019), Pre-diabetes, Prostatitis, chronic, PSA, INCREASED (07/09/2008), and Spinal stenosis of lumbar region.  PSH:    Past Surgical History:  Procedure Laterality Date   ABDOMINAL AORTOGRAM W/LOWER EXTREMITY [name] 09/12/2018   Procedure: ABDOMINAL AORTOGRAM W/LOWER EXTREMITY;  Surgeon: [name], MD;  Location: [facility];  Service: Cardiovascular;  Laterality: [name];   APPENDECTOMY     rupture   BACK SURGERY     1984 and then [facility]   BLADDER INSTILLATION [name] 10/19/2021   Procedure: BLADDER INSTILLATION OF GEMCITABINE;  Surgeon:  Abbie Sons, MD;  Location: [facility];  Service: Urology;  Laterality: [name];   CARDIAC CATHETERIZATION [name] 07/07/2015   Procedure: Left Heart Cath and Cors/Grafts Angiography;  Surgeon: [name], MD;  Location: [facility];  Service: Cardiovascular;  Laterality: [name];   CATARACT EXTRACTION, BILATERAL  2009   CERVICAL SPINE SURGERY  12/2016   cervical stenosis [name])   COLONOSCOPY  03/2010   HP polyp, diverticulosis, rpt 10 yrs ([name])   CORONARY ANGIOPLASTY  11/30/2000   [facility]   CORONARY ARTERY BYPASS GRAFT  1990   3 vessel    CYSTOSCOPY  12/23/2010   [facility]   EYE SURGERY     cataract   JOINT REPLACEMENT     KNEE ARTHROSCOPY Right    x2   LAMINOTOMY  1986   L5/S1 lumbar laminotomy for two ruptured discs/fusion   LUMBAR LAMINECTOMY/DECOMPRESSION MICRODISCECTOMY [name] 07/13/2016   Procedure: LUMBAR TWO-THREE, LUMBAR THREE-FOUR, LUMBAR FOUR-FIVE LAMINECTOMY AND FORAMINOTOMY;  Surgeon: [name], MD;  Location: [facility];  Service: Neurosurgery;  Laterality: [name];  LAMINECTOMY AND FORAMINOTOMY L2-L3, L3-L4,L4-L5   LUMBAR SPINE SURGERY  06/2020   Bilateral redo laminectomy/laminotomy/foraminotomies/medial facetectomy to decompress the bilateral L4 and L5 nerve roots [name])   TOTAL HIP ARTHROPLASTY Bilateral [id number]   TOTAL KNEE ARTHROPLASTY Right 03/06/2017   Procedure: RIGHT TOTAL KNEE ARTHROPLASTY;  Surgeon: Gaynelle Arabian, MD;  Location: [facility];  Service: Orthopedics;  Laterality: Right;   TRANSURETHRAL RESECTION OF BLADDER TUMOR [name] 10/19/2021   Procedure: TRANSURETHRAL RESECTION OF BLADDER TUMOR (TURBT);  Surgeon: Abbie Sons, MD;  Location: [facility];  Service: Urology;  Laterality: [name];    Current Outpatient Medications  Medication Sig Dispense Refill   acetaminophen (TYLENOL) 500 MG tablet Take 1,000 mg by mouth every 8 (eight) hours as needed for moderate pain.     aspirin EC 81 MG EC tablet Take 1 tablet (81 mg total) by mouth daily. 30 tablet 0    atorvastatin (LIPITOR) 20 MG tablet TAKE 1 TABLET BY MOUTH DAILY. 90 tablet 3   ciprofloxacin (CIPRO) 250 MG tablet Take 1 tablet (250 mg total) by mouth 2 (two) times daily for 7 days. 14 tablet 0   Cyanocobalamin (B-12) 1000 MCG SUBL Place 1 tablet under the tongue daily. 30 each    cyclobenzaprine (FLEXERIL) 5 MG tablet Take 1 tablet (5 mg total) by mouth 2 (two) times daily as needed for muscle spasms (sedation precautions). 20 tablet 0   docusate sodium (COLACE) 100 MG capsule Take 100 mg by mouth 2 (two) times daily as needed for mild constipation.     ezetimibe (ZETIA) 10 MG tablet TAKE 1 TABLET BY MOUTH  ONCE DAILY. (Patient taking differently: Take 10 mg by mouth at bedtime.) 90 tablet 2   fluticasone (FLONASE) 50 MCG/ACT nasal spray Place 1 spray into both nostrils as needed for allergies or rhinitis.     furosemide (LASIX) 20 MG tablet Take 20 mg by mouth every other day.     gabapentin (NEURONTIN) 600 MG tablet Take 600 mg by mouth 3 (three) times daily.     ipratropium-albuterol (DUONEB) 0.5-2.5 (3) MG/3ML SOLN Take 3 mLs by nebulization every 6 (six) hours as needed. 360 mL 11   isosorbide mononitrate (IMDUR) 30 MG 24 hr tablet Take 0.5 tablets (15 mg total) by mouth daily. 45 tablet 3   losartan (COZAAR) 25 MG tablet TAKE 1 TABLET BY MOUTH DAILY 90 tablet 0   methylPREDNISolone (MEDROL DOSEPAK) 4 MG TBPK tablet Take by mouth.     Nebulizers (COMPRESSOR/NEBULIZER) MISC Use as directed. 1 each 0   nitroGLYCERIN (NITROSTAT) 0.4 MG SL tablet Place 1 tablet (0.4 mg total) under the tongue every 5 (five) minutes as needed for chest pain. 25 tablet 3   OVER THE COUNTER MEDICATION Apply 1 application topically 2 (two) times daily as needed (pain). Cryotherapy roll on gel     OXYGEN Inhale 2 L into the lungs at bedtime.     pantoprazole (PROTONIX) 40 MG tablet Take 40 mg by mouth 2 (two) times daily.     polyethylene glycol (MIRALAX / GLYCOLAX) 17 g packet Take 17 g by mouth daily as needed  for mild constipation.      PROAIR HFA 108 (90 Base) MCG/ACT inhaler TAKE 2 PUFFS INTO LUNGS EVERY 6 HOURS ASNEEDED FOR WHEEZING OR SHORTNESS OF BREATH 8.5 g 6   ranolazine (RANEXA) 500 MG 12 hr tablet Take 1 tablet (500 mg total) by mouth 2 (two) times daily. 180 tablet 3   tamsulosin (FLOMAX) 0.4 MG CAPS capsule Take 0.4 mg by mouth at bedtime.     tolterodine (DETROL LA) 4 MG 24 hr capsule TAKE 1 CAPSULE EVERY DAY 90 capsule 1   No current facility-administered medications for this visit.     Allergies:   Vioxx [rofecoxib], Spiriva respimat [tiotropium bromide monohydrate], Contrast media [iodinated contrast media], and Morphine   Social History:  The patient  reports that he quit smoking about 33 years ago. His smoking use included cigarettes. He has a 25.00 pack-year smoking history. He has never used smokeless tobacco. He reports that he does not drink alcohol and does not use drugs.   Family History:   family history includes Cancer in his sister and another family member; Diabetes in his brother and mother; Heart attack in his mother; Heart disease in his father; Kidney disease in his father and sister; Stroke in his father and mother.   Review of Systems: Review of Systems  Constitutional:  Positive for malaise/fatigue.  HENT: Negative.    Respiratory: Negative.    Cardiovascular:  Positive for chest pain.  Gastrointestinal: Negative.   Musculoskeletal: Negative.   Neurological: Negative.   Psychiatric/Behavioral: Negative.    All other systems reviewed and are negative.   PHYSICAL EXAM: VS:  BP (!) 130/58 (BP Location: Left Arm, Patient Position: Sitting, Cuff Size: Normal)   Pulse 85   Ht 6' (1.829 m)   Wt 215 lb 2 oz (97.6 kg)   SpO2 98%   BMI 29.18 kg/m  , BMI Body mass index is 29.18 kg/m. Constitutional:  oriented to person, place, and time. No distress.  HENT:  Head: Grossly normal Eyes:  no discharge. No scleral icterus.  Neck: No JVD, no carotid bruits   Cardiovascular: Regular rate and rhythm, no murmurs appreciated Pulmonary/Chest: Clear to auscultation bilaterally, no wheezes or rails Abdominal: Soft.  no distension.  no tenderness.  Musculoskeletal: Normal range of motion Neurological:  normal muscle tone. Coordination normal. No atrophy Skin: Skin warm and dry Psychiatric: normal affect, pleasant   Recent Labs: 02/16/2021: TSH 2.77 07/08/2021: B Natriuretic Peptide 507.2 01/01/2022: ALT 14; BUN 29; Creatinine, Ser 1.47; Hemoglobin 12.8; Platelets 159; Potassium 4.4; Sodium 138    Lipid Panel Lab Results  Component Value Date   CHOL 114 02/16/2021   HDL 33.40 (L) 02/16/2021   LDLCALC 62 02/16/2021   TRIG 92.0 02/16/2021      Wt Readings from Last 3 Encounters:  02/01/22 215 lb 2 oz (97.6 kg)  01/13/22 220 lb 6 oz (100 kg)  12/29/21 219 lb 2 oz (99.4 kg)     ASSESSMENT AND PLAN:  Cardiomyopathy, post covid, recent bronchitis Recovered, EF up to 50 -55% by echo 09/2019, same in 2022, no change On lasix QOD Losartan and imdur B-blocker previously held, fatigue side effects, COPD He appears euvolemic, no significant leg swelling Recommend he try Lasix every third day given his renal dysfunction  Cad with stable angina Epsiode of chest pain, needed NTG Declining workup at this time.  Recommend he call us if he starts taking more nitro continue current medication regimen. Last stress test September 2022  Covid , 05/2019 Chronic shortness of breath recovered relatively well,  COPD/emphysema Followed by pulmonary Prior smoking history, Covid 2020 On inhalers Stable  Mixed hyperlipidemia Cholesterol is at goal on the current lipid regimen. No changes to the medications were made.  Essential hypertension Blood pressure is well controlled on today's visit. No changes made to the medications.  Bilateral carotid artery stenosis Mild bilateral carotid disease less than 39% bilaterally Continue aggressive lipid  management  Diet-controlled diabetes mellitus ([place]) Well-controlled  PAD Follows with Dr. [name] ,  bilateral SFA disease No claudications    Total encounter time more than 30 minutes  Greater than 50% was spent in counseling and coordination of care with the patient   No orders of the defined types were placed in this encounter.     Signed, Esmond Plants, M.D., Ph.D. 02/01/2022  [place], [place]

## 2022-02-01 ENCOUNTER — Encounter: Payer: Self-pay | Admitting: Cardiovascular Disease

## 2022-02-01 ENCOUNTER — Ambulatory Visit: Payer: PPO | Admitting: Cardiovascular Disease

## 2022-02-01 VITALS — BP 130/58 | HR 85 | Ht 72.0 in | Wt 215.1 lb

## 2022-02-01 DIAGNOSIS — I25118 Atherosclerotic heart disease of native coronary artery with other forms of angina pectoris: Secondary | ICD-10-CM

## 2022-02-01 DIAGNOSIS — I5032 Chronic diastolic (congestive) heart failure: Secondary | ICD-10-CM | POA: Diagnosis not present

## 2022-02-01 DIAGNOSIS — I739 Peripheral vascular disease, unspecified: Secondary | ICD-10-CM | POA: Diagnosis not present

## 2022-02-01 DIAGNOSIS — U071 COVID-19: Secondary | ICD-10-CM

## 2022-02-01 DIAGNOSIS — I5022 Chronic systolic (congestive) heart failure: Secondary | ICD-10-CM | POA: Diagnosis not present

## 2022-02-01 DIAGNOSIS — I255 Ischemic cardiomyopathy: Secondary | ICD-10-CM | POA: Diagnosis not present

## 2022-02-01 DIAGNOSIS — E782 Mixed hyperlipidemia: Secondary | ICD-10-CM | POA: Diagnosis not present

## 2022-02-01 DIAGNOSIS — I43 Cardiomyopathy in diseases classified elsewhere: Secondary | ICD-10-CM

## 2022-02-01 DIAGNOSIS — N183 Chronic kidney disease, stage 3 unspecified: Secondary | ICD-10-CM | POA: Diagnosis not present

## 2022-02-01 DIAGNOSIS — E785 Hyperlipidemia, unspecified: Secondary | ICD-10-CM

## 2022-02-01 DIAGNOSIS — I10 Essential (primary) hypertension: Secondary | ICD-10-CM

## 2022-02-01 NOTE — Patient Instructions (Signed)
Medication Instructions:  No changes  Nitro for chest pain Call if you have more angina  If you need a refill on your cardiac medications before your next appointment, please call your pharmacy.   Lab work: No new labs needed  Testing/Procedures: No new testing needed  Follow-Up: At [facility], you and your health needs are our priority.  As part of our continuing mission to provide you with exceptional heart care, we have created designated Provider Care Teams.  These Care Teams include your primary Cardiologist (physician) and Advanced Practice Providers (APPs -  Physician Assistants and Nurse Practitioners) who all work together to provide you with the care you need, when you need it.  You will need a follow up appointment in 6 months  Providers on your designated Care Team:   Murray Hodgkins, NP Christell Faith, PA-C Cadence Kathlen Mody, [place]  COVID-19 Vaccine Information can be found at: [email or web address] For questions related to vaccine distribution or appointments, please email [email or web address] .[email or web address] or call [phone].

## 2022-02-03 ENCOUNTER — Telehealth: Payer: Self-pay | Admitting: Pulmonary Disease

## 2022-02-03 ENCOUNTER — Ambulatory Visit: Payer: PPO | Admitting: Pulmonary Disease

## 2022-02-03 ENCOUNTER — Other Ambulatory Visit: Payer: Self-pay | Admitting: Cardiovascular Disease

## 2022-02-03 ENCOUNTER — Encounter: Payer: Self-pay | Admitting: Pulmonary Disease

## 2022-02-03 ENCOUNTER — Telehealth: Payer: Self-pay

## 2022-02-03 VITALS — BP 126/56 | HR 75 | Temp 97.9°F | Ht 72.0 in | Wt 219.4 lb

## 2022-02-03 DIAGNOSIS — J432 Centrilobular emphysema: Secondary | ICD-10-CM

## 2022-02-03 DIAGNOSIS — K4090 Unilateral inguinal hernia, without obstruction or gangrene, not specified as recurrent: Secondary | ICD-10-CM | POA: Diagnosis not present

## 2022-02-03 DIAGNOSIS — N3090 Cystitis, unspecified without hematuria: Secondary | ICD-10-CM | POA: Diagnosis not present

## 2022-02-03 DIAGNOSIS — M48061 Spinal stenosis, lumbar region without neurogenic claudication: Secondary | ICD-10-CM | POA: Diagnosis not present

## 2022-02-03 DIAGNOSIS — J310 Chronic rhinitis: Secondary | ICD-10-CM | POA: Diagnosis not present

## 2022-02-03 DIAGNOSIS — G4733 Obstructive sleep apnea (adult) (pediatric): Secondary | ICD-10-CM | POA: Diagnosis not present

## 2022-02-03 DIAGNOSIS — C679 Malignant neoplasm of bladder, unspecified: Secondary | ICD-10-CM | POA: Diagnosis not present

## 2022-02-03 MED ORDER — AZELASTINE HCL 0.15 % NA SOLN: 1.0000 | Freq: Every evening | NASAL | Status: DC | PRN

## 2022-02-03 MED ORDER — AZELASTINE HCL 0.15 % NA SOLN: 1.0000 | Freq: Every evening | NASAL | 11 refills | Status: DC | PRN

## 2022-02-03 NOTE — Telephone Encounter (Signed)
Astepro has been sent to preferred pharmacy. Patient is aware and voiced his understanding.  Nothing further needed.

## 2022-02-03 NOTE — Telephone Encounter (Signed)
Received a call from [facility], they faxed over a request for CPAP machine supplies last week with no response. Please advise.

## 2022-02-03 NOTE — Telephone Encounter (Signed)
Spoke with [facility] notifying them pt is monitored by pulmonology for his CPAP.  Verbalizes understanding and will document in pt's account.

## 2022-02-03 NOTE — Patient Instructions (Signed)
Astepro 1 spray in each nostril as needed before going to bed to help with sinus congestion.  Will have [facility] check your CPAP machine.  Follow up in 6 months.

## 2022-02-08 DIAGNOSIS — G4733 Obstructive sleep apnea (adult) (pediatric): Secondary | ICD-10-CM | POA: Diagnosis not present

## 2022-02-08 DIAGNOSIS — M1710 Unilateral primary osteoarthritis, unspecified knee: Secondary | ICD-10-CM | POA: Diagnosis not present

## 2022-02-08 DIAGNOSIS — J449 Chronic obstructive pulmonary disease, unspecified: Secondary | ICD-10-CM | POA: Diagnosis not present

## 2022-02-08 DIAGNOSIS — I10 Essential (primary) hypertension: Secondary | ICD-10-CM | POA: Diagnosis not present

## 2022-02-13 ENCOUNTER — Other Ambulatory Visit: Payer: Self-pay | Admitting: Cardiovascular Disease

## 2022-02-14 ENCOUNTER — Ambulatory Visit (INDEPENDENT_AMBULATORY_CARE_PROVIDER_SITE_OTHER): Payer: PPO

## 2022-02-14 VITALS — Ht 72.0 in | Wt 210.0 lb

## 2022-02-14 DIAGNOSIS — Z0000 Encounter for general adult medical examination without abnormal findings: Secondary | ICD-10-CM | POA: Diagnosis not present

## 2022-02-14 DIAGNOSIS — G4733 Obstructive sleep apnea (adult) (pediatric): Secondary | ICD-10-CM | POA: Diagnosis not present

## 2022-02-14 DIAGNOSIS — I10 Essential (primary) hypertension: Secondary | ICD-10-CM | POA: Diagnosis not present

## 2022-02-14 DIAGNOSIS — R0683 Snoring: Secondary | ICD-10-CM | POA: Diagnosis not present

## 2022-02-14 NOTE — Progress Notes (Signed)
Subjective:   [name] is a 81 y.o. male who presents for Medicare Annual/Subsequent preventive examination.  Review of Systems     Cardiac Risk Factors include: advanced age (>35mn, >>74women);hypertension;dyslipidemia;male gender;sedentary lifestyle     Objective:    Today's Vitals   02/14/22 1117 02/14/22 1119  Weight: 210 lb (95.3 kg)   Height: 6' (1.829 m)   PainSc:  5    Body mass index is 28.48 kg/m.     02/14/2022   11:26 AM 01/01/2022    6:03 PM 10/19/2021   12:51 PM 10/05/2021    1:23 PM 07/09/2021    8:30 PM 05/14/2021   10:46 AM 04/20/2021    1:13 PM  Advanced Directives  Does Patient Have a Medical Advance Directive? Yes No Yes Yes Yes No No  Type of AParamedicof [place]Grid[place]Living will  [place]Living will [place] will [place]   Does patient want to make changes to medical advance directive?   No - Patient declined  No - Patient declined    Copy of [place] Chart? Yes - validated most recent copy scanned in chart (See row information)  Yes - validated most recent copy scanned in chart (See row information) Yes - validated most recent copy scanned in chart (See row information) No - copy requested    Would patient like information on creating a medical advance directive?       No - Patient declined    Current Medications (verified) Outpatient Encounter Medications as of 02/14/2022  Medication Sig   acetaminophen (TYLENOL) 500 MG tablet Take 1,000 mg by mouth every 8 (eight) hours as needed for moderate pain.   aspirin EC 81 MG EC tablet Take 1 tablet (81 mg total) by mouth daily.   atorvastatin (LIPITOR) 20 MG tablet TAKE 1 TABLET BY MOUTH DAILY.   Azelastine HCl (ASTEPRO) 0.15 % SOLN Place 1 spray into the nose at bedtime as needed (Sinus congestion, allergies).   Cyanocobalamin (B-12) 1000 MCG SUBL Place 1 tablet under the tongue daily.    cyclobenzaprine (FLEXERIL) 5 MG tablet Take 1 tablet (5 mg total) by mouth 2 (two) times daily as needed for muscle spasms (sedation precautions).   docusate sodium (COLACE) 100 MG capsule Take 100 mg by mouth 2 (two) times daily as needed for mild constipation.   ezetimibe (ZETIA) 10 MG tablet TAKE 1 TABLET BY MOUTH ONCE DAILY. (Patient taking differently: Take 10 mg by mouth at bedtime.)   fluticasone (FLONASE) 50 MCG/ACT nasal spray Place 1 spray into both nostrils as needed for allergies or rhinitis.   furosemide (LASIX) 20 MG tablet Take 1 tablet by mouth every third day as directed.   gabapentin (NEURONTIN) 600 MG tablet Take 600 mg by mouth 3 (three) times daily.   ipratropium-albuterol (DUONEB) 0.5-2.5 (3) MG/3ML SOLN Take 3 mLs by nebulization every 6 (six) hours as needed.   isosorbide mononitrate (IMDUR) 30 MG 24 hr tablet Take 0.5 tablets (15 mg total) by mouth daily.   losartan (COZAAR) 25 MG tablet TAKE 1 TABLET BY MOUTH DAILY   Nebulizers (COMPRESSOR/NEBULIZER) MISC Use as directed.   nitroGLYCERIN (NITROSTAT) 0.4 MG SL tablet Place 1 tablet (0.4 mg total) under the tongue every 5 (five) minutes as needed for chest pain.   OVER THE COUNTER MEDICATION Apply 1 application topically 2 (two) times daily as needed (pain). Cryotherapy roll on gel  OXYGEN Inhale 2 L into the lungs at bedtime.   pantoprazole (PROTONIX) 40 MG tablet Take 40 mg by mouth 2 (two) times daily.   polyethylene glycol (MIRALAX / GLYCOLAX) 17 g packet Take 17 g by mouth daily as needed for mild constipation.    PROAIR HFA 108 (90 Base) MCG/ACT inhaler TAKE 2 PUFFS INTO LUNGS EVERY 6 HOURS ASNEEDED FOR WHEEZING OR SHORTNESS OF BREATH   ranolazine (RANEXA) 500 MG 12 hr tablet Take 1 tablet (500 mg total) by mouth 2 (two) times daily.   tamsulosin (FLOMAX) 0.4 MG CAPS capsule Take 0.4 mg by mouth at bedtime.   tolterodine (DETROL LA) 4 MG 24 hr capsule TAKE 1 CAPSULE EVERY DAY   [DISCONTINUED] furosemide (LASIX) 20 MG  tablet Take 20 mg by mouth every other day.   No facility-administered encounter medications on file as of 02/14/2022.    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 06/2019 Echo (in setting of COVID): EF 35-40%, mild LVH, g1 DD, glob HK; b. 09/2019 Echo: EF 50-55%, Gr1 DD; c. 03/2021 Echo: EF 50-55%, no rwma, mild LVH, GrI DD, nl RV fxn. RVSP 44.74mHg. Mild-mod dil LA. Triv MR. Mild-mod Ao sclerosis.   AAA (abdominal aortic aneurysm) (HCC)    Actinic keratosis    Anginal pain ([place]    BENIGN PROSTATIC HYPERTROPHY, WITH URINARY OBSTRUCTION 09/06/2007   CAD s/p CABG    a. 1990 s/p MI-->CABG x 2; b. 2002 s/p BMS to LCX; c. 06/2015 Cath: LM 50ost, LAD 100ost/p, 659m, D2 nl, LCX  patent stent, RCA 80p (small), LIMA->LAD nl, VG->D2 nl-->Med Rx; d. 05/2020 MV: fixed apical ant/apical defect. No signif ischemia; e. 04/2021 MV: EF 52%, no ischemia.   Carotid arterial disease ([facility]   a. 08/2016 Carotid U/S: <39% bilat.   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 did not tolerate CPAP.    Osteoarthritis    PAD (peripheral artery disease) ([facility] [place]   a. 07/2017 LE duplex: RSFA [id number] 75-49m23m-74d; c. 08/2018 Periph Angio: No signif AoIliac dzs. Mod-sev Ca2+ RSFA w/ diff dzs throughout- 3 vessel runoff. Borderline signif LSFA dzs w/ mod-sev Ca2+ vessels and 3 vessel runoff below the knee-->Med rx.   Pneumonia 07/2014   [facility] hospitalization   Pneumonia due to COVID-19 virus 06/02/2019   Pre-diabetes    Prostatitis, chronic    PSA, INCREASED 07/09/2008   Spinal stenosis of lumbar region    Past Surgical History:  Procedure Laterality Date   ABDOMINAL AORTOGRAM  W/LOWER EXTREMITY [name] 09/12/2018   Procedure: ABDOMINAL AORTOGRAM W/LOWER EXTREMITY;  Surgeon: [name], MD;  Location: [facility];  Service: Cardiovascular;  Laterality: [name];   APPENDECTOMY     rupture   BACK SURGERY     [facility]   BLADDER INSTILLATION [name] 10/19/2021   Procedure: BLADDER INSTILLATION OF GEMCITABINE;  Surgeon: Abbie Sons, MD;  Location: [facility];  Service: Urology;  Laterality: [name];   CARDIAC CATHETERIZATION [name] 07/07/2015   Procedure: Left Heart Cath and Cors/Grafts Angiography;  Surgeon: [name], MD;  Location: [facility];  Service: Cardiovascular;  Laterality: [name];   CATARACT EXTRACTION, BILATERAL  2009   CERVICAL SPINE SURGERY  12/2016   cervical stenosis [name])   COLONOSCOPY  03/2010   HP polyp, diverticulosis, rpt 10 yrs ([name])   CORONARY ANGIOPLASTY  11/30/2000   LCX   CORONARY ARTERY BYPASS GRAFT  1990   3 vessel    CYSTOSCOPY  12/23/2010   [facility]   EYE SURGERY     cataract   JOINT REPLACEMENT     KNEE ARTHROSCOPY Right    x2   LAMINOTOMY  1986   L5/S1 lumbar laminotomy for two ruptured discs/fusion   LUMBAR LAMINECTOMY/DECOMPRESSION MICRODISCECTOMY [name] 07/13/2016   Procedure: LUMBAR TWO-THREE, LUMBAR THREE-FOUR, LUMBAR FOUR-FIVE LAMINECTOMY AND FORAMINOTOMY;  Surgeon: [name], MD;  Location: [facility];   Service: Neurosurgery;  Laterality: [name];  LAMINECTOMY AND FORAMINOTOMY L2-L3, L3-L4,L4-L5   LUMBAR SPINE SURGERY  06/2020   Bilateral redo laminectomy/laminotomy/foraminotomies/medial facetectomy to decompress the bilateral L4 and L5 nerve roots [name])   TOTAL HIP ARTHROPLASTY Bilateral 1443,1540   TOTAL KNEE ARTHROPLASTY Right 03/06/2017   Procedure: RIGHT TOTAL KNEE ARTHROPLASTY;  Surgeon: Gaynelle Arabian, MD;  [facility];  Service: Orthopedics;  Laterality: Right;   TRANSURETHRAL RESECTION OF BLADDER TUMOR [name] 10/19/2021   Procedure: TRANSURETHRAL RESECTION OF BLADDER TUMOR (TURBT);  Surgeon: Abbie Sons, MD;  Location: [facility];  Service: Urology;  Laterality: [name];   Family History  Problem Relation Age of Onset   Stroke Mother    Heart attack Mother    Diabetes Mother    Stroke Father    Heart disease Father    Kidney disease Father        PCKD   Cancer Sister        throat   Diabetes Brother    Kidney disease Sister        PCKD   Cancer Other        5/7 nephews with lung cancer   Social History   Socioeconomic History   Marital status: Married    Spouse name: [name]   Number of children: 1   Years of education: Not on file   Highest education level: Not on file  Occupational History   Not on file  Tobacco Use   Smoking status: Former    Packs/day: 1.00    Years: 25.00    Total pack years: 25.00    Types: Cigarettes    Quit date: 08/15/1988    Years since quitting: 33.5   Smokeless tobacco: Never  Vaping Use   Vaping Use: Never used  Substance and Sexual Activity   Alcohol use: No    Alcohol/week: 0.0 standard drinks of alcohol   Drug use: No   Sexual activity: Yes  Other Topics Concern   Not on file  Social History Narrative   Married, wife [name], for 25  years in 2016. 1 daughter and 3 grandkids from first marriage.    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    Activity: part time farmer, raise goats and chickens. [employer]ows  yard. Volunteer work through church (Solicitor).    Diet: good water, fruits/vegetables daily      OSA eval recommended while hospitalized with Covid 05/2019   Social Determinants of Health   Financial Resource Strain: Low Risk  (02/14/2022)   Overall Financial Resource Strain (CARDIA)    Difficulty of Paying Living Expenses: Not hard at all  Food Insecurity: No Food Insecurity (02/14/2022)   Hunger Vital Sign    Worried About Running Out of Food in the Last Year: Never true    Ran Out of Food in the Last Year: Never true  Transportation Needs: No Transportation Needs (02/14/2022)   PRAPARE - Hydrologist (Medical): No    Lack of Transportation (Non-Medical): No  Physical Activity: Sufficiently Active (02/14/2022)   Exercise Vital Sign    Days of Exercise per Week: 5 days    Minutes of Exercise per Session: 30 min  Stress: No Stress Concern Present (02/14/2022)   [place]    Feeling of Stress : Not at all  Social Connections: [place] (02/14/2022)   Social Connection and Isolation Panel [NHANES]    Frequency of Communication with Friends and Family: More than three times a week    Frequency of Social Gatherings with Friends and Family: More than three times a week    Attends Religious Services: 1 to 4 times per year    Active Member of [employer] or Organizations: Yes    Attends Archivist Meetings: 1 to 4 times per year    Marital Status: Married    Tobacco Counseling Counseling given: Not Answered   Clinical Intake:  Pre-visit preparation completed: Yes  Pain : 0-10 Pain Score: 5  Pain Type: Chronic pain Pain Location: Back Pain Descriptors / Indicators: Discomfort, Aching Pain Onset: More than a month ago Pain Frequency: Intermittent     BMI - recorded: 28.48 Nutritional Status: BMI 25 -29 Overweight Nutritional Risks: None Diabetes: No  How often do  you need to have someone help you when you read instructions, pamphlets, or other written materials from your doctor or pharmacy?: 1 - Never  Diabetic?NO  Interpreter Needed?: No  Information entered by :: mj Gabor Lusk, lpn   Activities of Daily Living    02/14/2022   11:27 AM 10/05/2021    1:20 PM  In your present state of health, do you have any difficulty performing the following activities:  Hearing? 0 0  Vision? 0 0  Difficulty concentrating or making decisions? 0 0  Walking or climbing stairs? 0 1  Dressing or bathing? 0 0  Doing errands, shopping? 0 0  Preparing Food and eating ? N   Using the Toilet? N   In the past six months, have you accidently leaked urine? N   Do you have problems with loss of bowel control? N   Managing your Medications? N   Managing your Finances? N   Housekeeping or managing your Housekeeping? N     Patient Care Team: Ria Bush, MD as PCP - General (Family Medicine) Rockey Situ Kathlene November, MD as PCP - Cardiology (Cardiology) Debbora Dus, [facility] as Pharmacist (Pharmacist) [name], MD as Consulting Physician (Cardiology) Tyler Pita, MD  as Consulting Physician (Pulmonary Disease)  Indicate any recent Medical Services you may have received from other than [facility] providers in the past year (date may be approximate).     Assessment:   This is a routine wellness examination for [place].  Hearing/Vision screen Hearing Screening - Comments:: No hearing issues.  Vision Screening - Comments:: Glasses/Readers. [facility]. 08/2021.  Dietary issues and exercise activities discussed: Current Exercise Habits: Home exercise routine, Type of exercise: walking, Time (Minutes): 30, Frequency (Times/Week): 5, Weekly Exercise (Minutes/Week): 150, Intensity: Mild, Exercise limited by: cardiac condition(s);orthopedic condition(s);respiratory conditions(s)   Goals Addressed             This Visit's Progress    Exercise 3x per week (30 min  per time)       02/14/2022-Continue to move and stay active.        Depression Screen    02/14/2022   11:22 AM 02/11/2021   11:13 AM 08/21/2020   10:17 AM 12/09/2019    9:51 AM 12/03/2018    1:19 PM 11/30/2017   10:14 AM 10/17/2017    2:09 PM  PHQ 2/9 Scores  PHQ - 2 Score 0 0 0 0 0 0 0  PHQ- 9 Score  0  0 0 0     Fall Risk    02/14/2022   11:27 AM 02/11/2021   11:12 AM 08/21/2020   10:17 AM 12/09/2019    9:51 AM 12/03/2018    1:19 PM  Fall Risk   Falls in the past year? 1 0 0 0 1  Number falls in past yr: 0 0 0 0 1  Injury with Fall? 0 0 0 0 1  Comment     bruising only  Risk for fall due to : History of fall(s);Impaired balance/gait Medication side effect  Medication side effect   Follow up Falls prevention discussed Falls evaluation completed;Falls prevention discussed Falls evaluation completed Falls evaluation completed;Falls prevention discussed     FALL RISK PREVENTION PERTAINING TO THE HOME:  Any stairs in or around the home? Yes  If so, are there any without handrails? No  Home free of loose throw rugs in walkways, pet beds, electrical cords, etc? Yes  Adequate lighting in your home to reduce risk of falls? Yes   ASSISTIVE DEVICES UTILIZED TO PREVENT FALLS:  Life alert? Yes  Use of a cane, walker or w/c? No  Grab bars in the bathroom? Yes  Shower chair or bench in shower? Yes  Elevated toilet seat or a handicapped toilet? Yes   TIMED UP AND GO:  Was the test performed? No .  Phone visit. Cognitive Function:    02/11/2021   11:16 AM 12/09/2019    9:55 AM 12/03/2018    1:22 PM 11/30/2017   10:15 AM 11/29/2016   10:27 AM  MMSE - Mini Mental State Exam  Orientation to time '5 5 5 5 5  '$ Orientation to Place '5 5 5 5 5  '$ Registration '3 3 3 3 3  '$ Attention/ Calculation 5 5 0 0 0  Recall '3 3 3 3 3  '$ Language- name 2 objects   0 0 0  Language- repeat '1 1 1 1 1  '$ Language- follow 3 step command   0 3 3  Language- read & follow direction   0 0 0  Write a sentence   0 0 0   Copy design   0 0 0  Total score   17 20 20  02/14/2022   11:30 AM  6CIT Screen  What Year? 0 points  What month? 0 points  What time? 0 points  Count back from 20 0 points  Months in reverse 0 points  Repeat phrase 0 points  Total Score 0 points    Immunizations Immunization History  Administered Date(s) Administered   Fluad Quad(high Dose 65+) 04/17/2019   Influenza Whole 08/15/2000, 07/04/2007, 05/09/2008, 05/31/2010   Influenza, High Dose Seasonal PF 05/15/2013, 06/25/2015, 07/05/2021   Influenza,inj,Quad PF,6+ Mos 04/17/2014, 05/18/2016, 06/07/2018   Influenza-Unspecified 05/22/2017, 05/28/2020   PFIZER(Purple Top)SARS-COV-2 Vaccination 09/07/2019, 09/28/2019, 05/12/2020, 01/19/2021   Pneumococcal Conjugate-13 02/09/2015   Pneumococcal Polysaccharide-23 08/15/2000, 10/29/2008   Td 08/15/2005   Tdap 02/10/2018   Zoster Recombinat (Shingrix) 04/05/2017, 07/25/2017   Zoster, Live 02/04/2009    TDAP status: Up to date  Flu Vaccine status: Up to date  Pneumococcal vaccine status: Up to date  Covid-19 vaccine status: Completed vaccines  Qualifies for Shingles Vaccine? Yes   Zostavax completed Yes   Shingrix Completed?: Yes  Screening Tests Health Maintenance  Topic Date Due   COVID-19 Vaccine (5 - Booster for Pfizer series) 02/19/2022 (Originally 03/16/2021)   INFLUENZA VACCINE  03/15/2022   TETANUS/TDAP  02/11/2028   Pneumonia Vaccine 81+ Years old  Completed   Zoster Vaccines- Shingrix  Completed   HPV VACCINES  Aged Out    Health Maintenance  There are no preventive care reminders to display for this patient.  Colorectal cancer screening: No longer required.   Lung Cancer Screening: (Low Dose CT Chest recommended if Age 25-80 years, 30 pack-year currently smoking OR have quit w/in 15years.) does not qualify.  Additional Screening:  Hepatitis C Screening: does not qualify.  Vision Screening: Recommended annual ophthalmology exams for early  detection of glaucoma and other disorders of the eye. Is the patient up to date with their annual eye exam?  Yes  Who is the provider or what is the name of the office in which the patient attends annual eye exams? 08/2021 Dr. Katy Fitch If pt is not established with a provider, would they like to be referred to a provider to establish care? No .   Dental Screening: Recommended annual dental exams for proper oral hygiene  Community Resource Referral / Chronic Care Management: CRR required this visit?  No   CCM required this visit?  No      Plan:     I have personally reviewed and noted the following in the patient's chart:   Medical and social history Use of alcohol, tobacco or illicit drugs  Current medications and supplements including opioid prescriptions. Patient is not currently taking opioid prescriptions. Functional ability and status Nutritional status Physical activity Advanced directives List of other physicians Hospitalizations, surgeries, and ER visits in previous 12 months Vitals Screenings to include cognitive, depression, and falls Referrals and appointments  In addition, I have reviewed and discussed with patient certain preventive protocols, quality metrics, and best practice recommendations. A written personalized care plan for preventive services as well as general preventive health recommendations were provided to patient.     Chriss Driver, LPN   04/21/2835   Nurse Notes: Pt is up to date on all vaccines and health maintenance.

## 2022-02-14 NOTE — Telephone Encounter (Signed)
Pt taking Furosemide 20 mg every third day as directed. Pt would like 90 day refill.

## 2022-02-14 NOTE — Patient Instructions (Signed)
[name] , Thank you for taking time to come for your Medicare Wellness Visit. I appreciate your ongoing commitment to your health goals. Please review the following plan we discussed and let me know if I can assist you in the future.   Screening recommendations/referrals: Colonoscopy: No longer required.  Recommended yearly ophthalmology/optometry visit for glaucoma screening and checkup Recommended yearly dental visit for hygiene and checkup  Vaccinations: Influenza vaccine: Done 07/05/2021 Repeat in 1 year.  Pneumococcal vaccine: Done 02/09/2015 and 10/29/2008. Tdap vaccine: Done 02/10/2018 Repeat in 10 years  Shingles vaccine: Done 07/25/2017, 04/05/2017 and 02/04/2009.   Covid-19: Done 01/19/2021, 05/12/2020 and 09/28/2019.  Advanced directives: Copies in chart.  Conditions/risks identified: KEEP UP THE GOOD WORK!!  Next appointment: Follow up in one year for your annual wellness visit. 2024.  Preventive Care 81 Years and Older, Male  Preventive care refers to lifestyle choices and visits with your health care provider that can promote health and wellness. What does preventive care include? A yearly physical exam. This is also called an annual well check. Dental exams once or twice a year. Routine eye exams. Ask your health care provider how often you should have your eyes checked. Personal lifestyle choices, including: Daily care of your teeth and gums. Regular physical activity. Eating a healthy diet. Avoiding tobacco and drug use. Limiting alcohol use. Practicing safe sex. Taking low doses of aspirin every day. Taking vitamin and mineral supplements as recommended by your health care provider. What happens during an annual well check? The services and screenings done by your health care provider during your annual well check will depend on your age, overall health, lifestyle risk factors, and family history of disease. Counseling  Your health care provider may ask you  questions about your: Alcohol use. Tobacco use. Drug use. Emotional well-being. Home and relationship well-being. Sexual activity. Eating habits. History of falls. Memory and ability to understand (cognition). Work and work Statistician. Screening  You may have the following tests or measurements: Height, weight, and BMI. Blood pressure. Lipid and cholesterol levels. These may be checked every 5 years, or more frequently if you are over 81 years old. Skin check. Lung cancer screening. You may have this screening every year starting at age 56 if you have a 30-pack-year history of smoking and currently smoke or have quit within the past 15 years. Fecal occult blood test (FOBT) of the stool. You may have this test every year starting at age 26. Flexible sigmoidoscopy or colonoscopy. You may have a sigmoidoscopy every 5 years or a colonoscopy every 10 years starting at age 40. Prostate cancer screening. Recommendations will vary depending on your family history and other risks. Hepatitis C blood test. Hepatitis B blood test. Sexually transmitted disease (STD) testing. Diabetes screening. This is done by checking your blood sugar (glucose) after you have not eaten for a while (fasting). You may have this done every 1-3 years. Abdominal aortic aneurysm (AAA) screening. You may need this if you are a current or former smoker. Osteoporosis. You may be screened starting at age 16 if you are at high risk. Talk with your health care provider about your test results, treatment options, and if necessary, the need for more tests. Vaccines  Your health care provider may recommend certain vaccines, such as: Influenza vaccine. This is recommended every year. Tetanus, diphtheria, and acellular pertussis (Tdap, Td) vaccine. You may need a Td booster every 10 years. Zoster vaccine. You may need this after age 48. Pneumococcal 13-valent  conjugate (PCV13) vaccine. One dose is recommended after age  23. Pneumococcal polysaccharide (PPSV23) vaccine. One dose is recommended after age 46. Talk to your health care provider about which screenings and vaccines you need and how often you need them. This information is not intended to replace advice given to you by your health care provider. Make sure you discuss any questions you have with your health care provider. Document Released: 08/28/2015 Document Revised: 04/20/2016 Document Reviewed: 06/02/2015 Elsevier Interactive Patient Education  2017 [employer] Prevention in the Home Falls can cause injuries. They can happen to people of all ages. There are many things you can do to make your home safe and to help prevent falls. What can I do on the outside of my home? Regularly fix the edges of walkways and driveways and fix any cracks. Remove anything that might make you trip as you walk through a door, such as a raised step or threshold. Trim any bushes or trees on the path to your home. Use bright outdoor lighting. Clear any walking paths of anything that might make someone trip, such as rocks or tools. Regularly check to see if handrails are loose or broken. Make sure that both sides of any steps have handrails. Any raised decks and porches should have guardrails on the edges. Have any leaves, snow, or ice cleared regularly. Use sand or salt on walking paths during winter. Clean up any spills in your garage right away. This includes oil or grease spills. What can I do in the bathroom? Use night lights. Install grab bars by the toilet and in the tub and shower. Do not use towel bars as grab bars. Use non-skid mats or decals in the tub or shower. If you need to sit down in the shower, use a plastic, non-slip stool. Keep the floor dry. Clean up any water that spills on the floor as soon as it happens. Remove soap buildup in the tub or shower regularly. Attach bath mats securely with double-sided non-slip rug tape. Do not have throw  rugs and other things on the floor that can make you trip. What can I do in the bedroom? Use night lights. Make sure that you have a light by your bed that is easy to reach. Do not use any sheets or blankets that are too big for your bed. They should not hang down onto the floor. Have a firm chair that has side arms. You can use this for support while you get dressed. Do not have throw rugs and other things on the floor that can make you trip. What can I do in the kitchen? Clean up any spills right away. Avoid walking on wet floors. Keep items that you use a lot in easy-to-reach places. If you need to reach something above you, use a strong step stool that has a grab bar. Keep electrical cords out of the way. Do not use floor polish or wax that makes floors slippery. If you must use wax, use non-skid floor wax. Do not have throw rugs and other things on the floor that can make you trip. What can I do with my stairs? Do not leave any items on the stairs. Make sure that there are handrails on both sides of the stairs and use them. Fix handrails that are broken or loose. Make sure that handrails are as long as the stairways. Check any carpeting to make sure that it is firmly attached to the stairs. Fix any carpet that  is loose or worn. Avoid having throw rugs at the top or bottom of the stairs. If you do have throw rugs, attach them to the floor with carpet tape. Make sure that you have a light switch at the top of the stairs and the bottom of the stairs. If you do not have them, ask someone to add them for you. What else can I do to help prevent falls? Wear shoes that: Do not have high heels. Have rubber bottoms. Are comfortable and fit you well. Are closed at the toe. Do not wear sandals. If you use a stepladder: Make sure that it is fully opened. Do not climb a closed stepladder. Make sure that both sides of the stepladder are locked into place. Ask someone to hold it for you, if  possible. Clearly mark and make sure that you can see: Any grab bars or handrails. First and last steps. Where the edge of each step is. Use tools that help you move around (mobility aids) if they are needed. These include: Canes. Walkers. Scooters. Crutches. Turn on the lights when you go into a dark area. Replace any light bulbs as soon as they burn out. Set up your furniture so you have a clear path. Avoid moving your furniture around. If any of your floors are uneven, fix them. If there are any pets around you, be aware of where they are. Review your medicines with your doctor. Some medicines can make you feel dizzy. This can increase your chance of falling. Ask your doctor what other things that you can do to help prevent falls. This information is not intended to replace advice given to you by your health care provider. Make sure you discuss any questions you have with your health care provider. Document Released: 05/28/2009 Document Revised: 01/07/2016 Document Reviewed: 09/05/2014 Elsevier Interactive Patient Education  2017 [employer].

## 2022-02-17 ENCOUNTER — Other Ambulatory Visit: Payer: Self-pay | Admitting: Family Medicine

## 2022-02-17 DIAGNOSIS — E538 Deficiency of other specified B group vitamins: Secondary | ICD-10-CM

## 2022-02-17 DIAGNOSIS — R7303 Prediabetes: Secondary | ICD-10-CM

## 2022-02-17 DIAGNOSIS — N138 Other obstructive and reflux uropathy: Secondary | ICD-10-CM

## 2022-02-17 DIAGNOSIS — E782 Mixed hyperlipidemia: Secondary | ICD-10-CM

## 2022-02-17 DIAGNOSIS — N1831 Chronic kidney disease, stage 3a: Secondary | ICD-10-CM

## 2022-02-18 ENCOUNTER — Other Ambulatory Visit (INDEPENDENT_AMBULATORY_CARE_PROVIDER_SITE_OTHER): Payer: PPO

## 2022-02-18 DIAGNOSIS — N1831 Chronic kidney disease, stage 3a: Secondary | ICD-10-CM | POA: Diagnosis not present

## 2022-02-18 DIAGNOSIS — E538 Deficiency of other specified B group vitamins: Secondary | ICD-10-CM

## 2022-02-18 DIAGNOSIS — R7303 Prediabetes: Secondary | ICD-10-CM

## 2022-02-18 DIAGNOSIS — E782 Mixed hyperlipidemia: Secondary | ICD-10-CM

## 2022-02-18 LAB — MICROALBUMIN / CREATININE URINE RATIO
Creatinine,U: 52.7 mg/dL
Microalb Creat Ratio: 1.3 mg/g (ref 0.0–30.0)
Microalb, Ur: <0.7 mg/dL (ref 0.0–1.9)

## 2022-02-18 NOTE — Addendum Note (Signed)
Addended by: Lerry Liner on: 02/18/2022 09:32 AM   Modules accepted: Orders

## 2022-02-21 ENCOUNTER — Other Ambulatory Visit (INDEPENDENT_AMBULATORY_CARE_PROVIDER_SITE_OTHER): Payer: PPO

## 2022-02-21 DIAGNOSIS — R7303 Prediabetes: Secondary | ICD-10-CM | POA: Diagnosis not present

## 2022-02-21 DIAGNOSIS — E782 Mixed hyperlipidemia: Secondary | ICD-10-CM

## 2022-02-21 DIAGNOSIS — E538 Deficiency of other specified B group vitamins: Secondary | ICD-10-CM | POA: Diagnosis not present

## 2022-02-21 DIAGNOSIS — N1831 Chronic kidney disease, stage 3a: Secondary | ICD-10-CM | POA: Diagnosis not present

## 2022-02-21 LAB — CBC WITH DIFFERENTIAL/PLATELET
Basophils Absolute: 0 10*3/uL (ref 0.0–0.1)
Basophils Relative: 0.7 % (ref 0.0–3.0)
Eosinophils Absolute: 0.3 10*3/uL (ref 0.0–0.7)
Eosinophils Relative: 6.1 % — ABNORMAL HIGH (ref 0.0–5.0)
HCT: 35.9 % — ABNORMAL LOW (ref 39.0–52.0)
Hemoglobin: 12.3 g/dL — ABNORMAL LOW (ref 13.0–17.0)
Lymphocytes Relative: 27.9 % (ref 12.0–46.0)
Lymphs Abs: 1.4 10*3/uL (ref 0.7–4.0)
MCHC: 34.2 g/dL (ref 30.0–36.0)
MCV: 95.8 fl (ref 78.0–100.0)
Monocytes Absolute: 0.5 10*3/uL (ref 0.1–1.0)
Monocytes Relative: 9.4 % (ref 3.0–12.0)
Neutro Abs: 2.9 10*3/uL (ref 1.4–7.7)
Neutrophils Relative %: 55.9 % (ref 43.0–77.0)
Platelets: 148 10*3/uL — ABNORMAL LOW (ref 150.0–400.0)
RBC: 3.74 Mil/uL — ABNORMAL LOW (ref 4.22–5.81)
RDW: 13.6 % (ref 11.5–15.5)
WBC: 5.2 10*3/uL (ref 4.0–10.5)

## 2022-02-21 LAB — COMPREHENSIVE METABOLIC PANEL
ALT: 10 U/L (ref 0–53)
AST: 13 U/L (ref 0–37)
Albumin: 4.2 g/dL (ref 3.5–5.2)
Alkaline Phosphatase: 43 U/L (ref 39–117)
BUN: 29 mg/dL — ABNORMAL HIGH (ref 6–23)
CO2: 26 mEq/L (ref 19–32)
Calcium: 8.7 mg/dL (ref 8.4–10.5)
Chloride: 104 mEq/L (ref 96–112)
Creatinine, Ser: 1.42 mg/dL (ref 0.40–1.50)
GFR: 46.54 mL/min — ABNORMAL LOW (ref >60.00)
Glucose, Bld: 101 mg/dL — ABNORMAL HIGH (ref 70–99)
Potassium: 4.4 mEq/L (ref 3.5–5.1)
Sodium: 138 mEq/L (ref 135–145)
Total Bilirubin: 0.5 mg/dL (ref 0.2–1.2)
Total Protein: 5.9 g/dL — ABNORMAL LOW (ref 6.0–8.3)

## 2022-02-21 LAB — VITAMIN D 25 HYDROXY (VIT D DEFICIENCY, FRACTURES): VITD: 41.77 ng/mL (ref 30.00–100.00)

## 2022-02-21 LAB — LIPID PANEL
Cholesterol: 98 mg/dL (ref 0–200)
HDL: 32.2 mg/dL — ABNORMAL LOW (ref >39.00)
LDL Cholesterol: 49 mg/dL (ref 0–99)
NonHDL: 66.02
Total CHOL/HDL Ratio: 3
Triglycerides: 83 mg/dL (ref 0.0–149.0)
VLDL: 16.6 mg/dL (ref 0.0–40.0)

## 2022-02-21 LAB — HEMOGLOBIN A1C: Hgb A1c MFr Bld: 6.5 % (ref 4.6–6.5)

## 2022-02-21 LAB — VITAMIN B12: Vitamin B-12: 1059 pg/mL — ABNORMAL HIGH (ref 211–911)

## 2022-02-21 NOTE — Addendum Note (Signed)
Addended by: Ellamae Sia on: 02/21/2022 09:50 AM   Modules accepted: Orders

## 2022-02-25 ENCOUNTER — Encounter: Payer: Self-pay | Admitting: Family Medicine

## 2022-02-25 ENCOUNTER — Ambulatory Visit (INDEPENDENT_AMBULATORY_CARE_PROVIDER_SITE_OTHER): Payer: PPO | Admitting: Family Medicine

## 2022-02-25 VITALS — BP 134/62 | HR 76 | Temp 97.3°F | Ht 71.5 in | Wt 212.4 lb

## 2022-02-25 DIAGNOSIS — I10 Essential (primary) hypertension: Secondary | ICD-10-CM

## 2022-02-25 DIAGNOSIS — K219 Gastro-esophageal reflux disease without esophagitis: Secondary | ICD-10-CM

## 2022-02-25 DIAGNOSIS — E538 Deficiency of other specified B group vitamins: Secondary | ICD-10-CM

## 2022-02-25 DIAGNOSIS — M5116 Intervertebral disc disorders with radiculopathy, lumbar region: Secondary | ICD-10-CM

## 2022-02-25 DIAGNOSIS — I6523 Occlusion and stenosis of bilateral carotid arteries: Secondary | ICD-10-CM

## 2022-02-25 DIAGNOSIS — Z87891 Personal history of nicotine dependence: Secondary | ICD-10-CM | POA: Diagnosis not present

## 2022-02-25 DIAGNOSIS — I25708 Atherosclerosis of coronary artery bypass graft(s), unspecified, with other forms of angina pectoris: Secondary | ICD-10-CM

## 2022-02-25 DIAGNOSIS — Z0000 Encounter for general adult medical examination without abnormal findings: Secondary | ICD-10-CM

## 2022-02-25 DIAGNOSIS — E782 Mixed hyperlipidemia: Secondary | ICD-10-CM | POA: Diagnosis not present

## 2022-02-25 DIAGNOSIS — C679 Malignant neoplasm of bladder, unspecified: Secondary | ICD-10-CM

## 2022-02-25 DIAGNOSIS — M4802 Spinal stenosis, cervical region: Secondary | ICD-10-CM

## 2022-02-25 DIAGNOSIS — R600 Localized edema: Secondary | ICD-10-CM

## 2022-02-25 DIAGNOSIS — G4733 Obstructive sleep apnea (adult) (pediatric): Secondary | ICD-10-CM

## 2022-02-25 DIAGNOSIS — E1169 Type 2 diabetes mellitus with other specified complication: Secondary | ICD-10-CM | POA: Diagnosis not present

## 2022-02-25 DIAGNOSIS — G6289 Other specified polyneuropathies: Secondary | ICD-10-CM

## 2022-02-25 DIAGNOSIS — N401 Enlarged prostate with lower urinary tract symptoms: Secondary | ICD-10-CM | POA: Diagnosis not present

## 2022-02-25 DIAGNOSIS — N138 Other obstructive and reflux uropathy: Secondary | ICD-10-CM

## 2022-02-25 DIAGNOSIS — I739 Peripheral vascular disease, unspecified: Secondary | ICD-10-CM

## 2022-02-25 DIAGNOSIS — J439 Emphysema, unspecified: Secondary | ICD-10-CM

## 2022-02-25 DIAGNOSIS — R7989 Other specified abnormal findings of blood chemistry: Secondary | ICD-10-CM

## 2022-02-25 DIAGNOSIS — N1831 Chronic kidney disease, stage 3a: Secondary | ICD-10-CM

## 2022-02-25 NOTE — Patient Instructions (Addendum)
Trial pantoprazole (protonix) once daily. Good to see you today Return as needed or in 6 months for follow up visit  Stay off B12 supplement at this time.   Health Maintenance After Age 81 After age 71, you are at a higher risk for certain long-term diseases and infections as well as injuries from falls. Falls are a major cause of broken bones and head injuries in people who are older than age 76. Getting regular preventive care can help to keep you healthy and well. Preventive care includes getting regular testing and making lifestyle changes as recommended by your health care provider. Talk with your health care provider about: Which screenings and tests you should have. A screening is a test that checks for a disease when you have no symptoms. A diet and exercise plan that is right for you. What should I know about screenings and tests to prevent falls? Screening and testing are the best ways to find a health problem early. Early diagnosis and treatment give you the best chance of managing medical conditions that are common after age 47. Certain conditions and lifestyle choices may make you more likely to have a fall. Your health care provider may recommend: Regular vision checks. Poor vision and conditions such as cataracts can make you more likely to have a fall. If you wear glasses, make sure to get your prescription updated if your vision changes. Medicine review. Work with your health care provider to regularly review all of the medicines you are taking, including over-the-counter medicines. Ask your health care provider about any side effects that may make you more likely to have a fall. Tell your health care provider if any medicines that you take make you feel dizzy or sleepy. Strength and balance checks. Your health care provider may recommend certain tests to check your strength and balance while standing, walking, or changing positions. Foot health exam. Foot pain and numbness, as well as  not wearing proper footwear, can make you more likely to have a fall. Screenings, including: Osteoporosis screening. Osteoporosis is a condition that causes the bones to get weaker and break more easily. Blood pressure screening. Blood pressure changes and medicines to control blood pressure can make you feel dizzy. Depression screening. You may be more likely to have a fall if you have a fear of falling, feel depressed, or feel unable to do activities that you used to do. Alcohol use screening. Using too much alcohol can affect your balance and may make you more likely to have a fall. Follow these instructions at home: Lifestyle Do not drink alcohol if: Your health care provider tells you not to drink. If you drink alcohol: Limit how much you have to: 0-1 drink a day for women. 0-2 drinks a day for men. Know how much alcohol is in your drink. In the [place] one drink equals one 12 oz bottle of beer (355 mL), one 5 oz glass of wine (148 mL), or one 1 oz glass of hard liquor (44 mL). Do not use any products that contain nicotine or tobacco. These products include cigarettes, chewing tobacco, and vaping devices, such as e-cigarettes. If you need help quitting, ask your health care provider. Activity  Follow a regular exercise program to stay fit. This will help you maintain your balance. Ask your health care provider what types of exercise are appropriate for you. If you need a cane or walker, use it as recommended by your health care provider. Wear supportive shoes that have nonskid  soles. Safety  Remove any tripping hazards, such as rugs, cords, and clutter. Install safety equipment such as grab bars in bathrooms and safety rails on stairs. Keep rooms and walkways well-lit. General instructions Talk with your health care provider about your risks for falling. Tell your health care provider if: You fall. Be sure to tell your health care provider about all falls, even ones that seem  minor. You feel dizzy, tiredness (fatigue), or off-balance. Take over-the-counter and prescription medicines only as told by your health care provider. These include supplements. Eat a healthy diet and maintain a healthy weight. A healthy diet includes low-fat dairy products, low-fat (lean) meats, and fiber from whole grains, beans, and lots of fruits and vegetables. Stay current with your vaccines. Schedule regular health, dental, and eye exams. Summary Having a healthy lifestyle and getting preventive care can help to protect your health and wellness after age 35. Screening and testing are the best way to find a health problem early and help you avoid having a fall. Early diagnosis and treatment give you the best chance for managing medical conditions that are more common for people who are older than age 63. Falls are a major cause of broken bones and head injuries in people who are older than age 54. Take precautions to prevent a fall at home. Work with your health care provider to learn what changes you can make to improve your health and wellness and to prevent falls. This information is not intended to replace advice given to you by your health care provider. Make sure you discuss any questions you have with your health care provider. Document Revised: 12/21/2020 Document Reviewed: 12/21/2020 Elsevier Patient Education  [place].

## 2022-02-25 NOTE — Progress Notes (Unsigned)
Patient ID: [name], male    DOB: 1940-08-18, 81 y.o.   MRN: [id number]  This visit was conducted in person.  BP 134/62   Pulse 76   Temp (!) 97.3 F (36.3 C) (Temporal)   Ht 5' 11.5" (1.816 m)   Wt 212 lb 6 oz (96.3 kg)   SpO2 95%   BMI 29.21 kg/m    CC: CPE Subjective:   HPI: [name] is a 81 y.o. male presenting on 02/25/2022 for Annual Exam (MCR prt 2. )   Saw health advisor last week for medicare wellness visit. Note reviewed.    No results found.  Flowsheet Row Clinical Support from 02/14/2022 in [facility] [place]  PHQ-2 Total Score 0          02/14/2022   11:27 AM 02/11/2021   11:12 AM 08/21/2020   10:17 AM 12/09/2019    9:51 AM 12/03/2018    1:19 PM  Fall Risk   Falls in the past year? 1 0 0 0 1  Number falls in past yr: 0 0 0 0 1  Injury with Fall? 0 0 0 0 1  Comment     bruising only  Risk for fall due to : History of fall(s);Impaired balance/gait Medication side effect  Medication side effect   Follow up Falls prevention discussed Falls evaluation completed;Falls prevention discussed Falls evaluation completed Falls evaluation completed;Falls prevention discussed   Mechanical fall, no injury. Tripped onto flower pot.   OSA on CPAP via nasal pillow (face mask not tolerated) and COPD followed by pulmonology. Continues PRN duoneb, albuterol.   S/p bilateral redo laminectomy/laminotomy/foraminotomies/medial facetectomy (instrumentation and fusion) to decompress the bilateral L4 and L5 nerve roots [name]) 07/06/2020.   Known PAD (bilateral calcified SFA disease, R>L) followed by Dr [name].   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   Known bladder cancer s/p 6/6 BCG instillations ([facility]).   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 Worse neuropathy to L foot. This affects balance. Has seen [facility] neurosurgery and ortho [place].   Notes chronic L>R foot dependent  swelling for 6+ months.   Preventative: Colon cancer screening - colonoscopy 03/2010 HP polyp, diverticulosis, rpt 10 yrs ([name]). iFOB normal 01/2020. Denies blood in stool. Age out.  Prostate cancer screening - sees urology yearly  Lung cancer screening - not eligible  Flu shot - yearly Megargel 08/2019, 09/2019, boosters 04/2020, 01/2021 Td 2007, Tdap 2019 Pneumovax23 2002, 2010, prevnar13 01/2015 Zostavax 2010 shingrix - 2018 x2  Advanced directive: received and scanned 05/2019. HCPOA is wife [name] then [name]. Does not want prolonged life support if terminal condition.  Seat belt use discussed Sunscreen use discussed. No changing moles on skin. Sees derm Nehemiah Massed).  Sleep - averaging 6-8 hours/night Ex smoker - quit 1990   Alcohol - none  Bowel - no constipation Bladder - stable incontinence managed on detrol LA. Takes lasix Q3 days    Married, wife [name], for 25 years in 2016. 1 daughter and 3 grandkids from first marriage. 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 Mows yard. Diet: good water, fruits/vegetables daily      Relevant past medical, surgical, family and social history reviewed and updated as indicated. Interim medical history since our last visit reviewed. Allergies and medications reviewed and updated. Outpatient Medications Prior to Visit  Medication  Sig Dispense Refill   acetaminophen (TYLENOL) 500 MG tablet Take 1,000 mg by mouth every 8 (eight) hours as needed for moderate pain.     aspirin EC 81 MG EC tablet Take 1 tablet (81 mg total) by mouth daily. 30 tablet 0   atorvastatin (LIPITOR) 20 MG tablet TAKE 1 TABLET BY MOUTH DAILY. 90 tablet 3   Azelastine HCl (ASTEPRO) 0.15 % SOLN Place 1 spray into the nose at bedtime as needed (Sinus congestion, allergies). 30 mL 11   cyclobenzaprine (FLEXERIL) 5 MG tablet Take 1 tablet (5 mg total) by mouth 2 (two) times daily as needed  for muscle spasms (sedation precautions). 20 tablet 0   docusate sodium (COLACE) 100 MG capsule Take 100 mg by mouth 2 (two) times daily as needed for mild constipation.     ezetimibe (ZETIA) 10 MG tablet TAKE 1 TABLET BY MOUTH ONCE DAILY. (Patient taking differently: Take 10 mg by mouth at bedtime.) 90 tablet 2   fluticasone (FLONASE) 50 MCG/ACT nasal spray Place 1 spray into both nostrils as needed for allergies or rhinitis.     furosemide (LASIX) 20 MG tablet Take 1 tablet by mouth every third day as directed. 30 tablet 2   gabapentin (NEURONTIN) 600 MG tablet Take 600 mg by mouth 3 (three) times daily.     ipratropium-albuterol (DUONEB) 0.5-2.5 (3) MG/3ML SOLN Take 3 mLs by nebulization every 6 (six) hours as needed. 360 mL 11   isosorbide mononitrate (IMDUR) 30 MG 24 hr tablet Take 0.5 tablets (15 mg total) by mouth daily. 45 tablet 3   losartan (COZAAR) 25 MG tablet TAKE 1 TABLET BY MOUTH DAILY 90 tablet 0   Nebulizers (COMPRESSOR/NEBULIZER) MISC Use as directed. 1 each 0   nitroGLYCERIN (NITROSTAT) 0.4 MG SL tablet Place 1 tablet (0.4 mg total) under the tongue every 5 (five) minutes as needed for chest pain. 25 tablet 3   OVER THE COUNTER MEDICATION Apply 1 application topically 2 (two) times daily as needed (pain). Cryotherapy roll on gel     OXYGEN Inhale 2 L into the lungs at bedtime.     pantoprazole (PROTONIX) 40 MG tablet Take 40 mg by mouth 2 (two) times daily.     polyethylene glycol (MIRALAX / GLYCOLAX) 17 g packet Take 17 g by mouth daily as needed for mild constipation.      PROAIR HFA 108 (90 Base) MCG/ACT inhaler TAKE 2 PUFFS INTO LUNGS EVERY 6 HOURS ASNEEDED FOR WHEEZING OR SHORTNESS OF BREATH 8.5 g 6   ranolazine (RANEXA) 500 MG 12 hr tablet Take 1 tablet (500 mg total) by mouth 2 (two) times daily. 180 tablet 3   tamsulosin (FLOMAX) 0.4 MG CAPS capsule Take 0.4 mg by mouth at bedtime.     tolterodine (DETROL LA) 4 MG 24 hr capsule TAKE 1 CAPSULE EVERY DAY 90 capsule 1    Cyanocobalamin (B-12) 1000 MCG SUBL Place 1 tablet under the tongue daily. 30 each    No facility-administered medications prior to visit.     Per HPI unless specifically indicated in ROS section below Review of Systems  Constitutional:  Negative for activity change, appetite change, chills, fatigue, fever and unexpected weight change.  HENT:  Negative for hearing loss.   Eyes:  Negative for visual disturbance.  Respiratory:  Negative for cough, chest tightness, shortness of breath and wheezing.   Cardiovascular:  Positive for chest pain (occ) and leg swelling (L>R). Negative for palpitations.  Gastrointestinal:  Negative for abdominal distention,  abdominal pain, blood in stool, constipation, diarrhea, nausea and vomiting.  Genitourinary:  Negative for difficulty urinating and hematuria.  Musculoskeletal:  Negative for arthralgias, myalgias and neck pain.  Skin:  Negative for rash.  Neurological:  Negative for dizziness, seizures, syncope and headaches.  Hematological:  Negative for adenopathy. Bruises/bleeds easily.  Psychiatric/Behavioral:  Negative for dysphoric mood. The patient is not nervous/anxious.     Objective:  BP 134/62   Pulse 76   Temp (!) 97.3 F (36.3 C) (Temporal)   Ht 5' 11.5" (1.816 m)   Wt 212 lb 6 oz (96.3 kg)   SpO2 95%   BMI 29.21 kg/m   Wt Readings from Last 3 Encounters:  02/25/22 212 lb 6 oz (96.3 kg)  02/14/22 210 lb (95.3 kg)  02/03/22 219 lb 6.4 oz (99.5 kg)      Physical Exam Vitals and nursing note reviewed.  Constitutional:      General: He is not in acute distress.    Appearance: Normal appearance. He is well-developed. He is not ill-appearing.  HENT:     Head: Normocephalic and atraumatic.     Right Ear: Hearing, tympanic membrane, ear canal and external ear normal.     Left Ear: Hearing, tympanic membrane, ear canal and external ear normal.     Ears:     Comments: L ear tube in place Eyes:     General: No scleral icterus.     Extraocular Movements: Extraocular movements intact.     Conjunctiva/sclera: Conjunctivae normal.     Pupils: Pupils are equal, round, and reactive to light.  Neck:     Thyroid: No thyroid mass or thyromegaly.     Vascular: No carotid bruit.  Cardiovascular:     Rate and Rhythm: Normal rate and regular rhythm.     Pulses: Normal pulses.          Radial pulses are 2+ on the right side and 2+ on the left side.     Heart sounds: Normal heart sounds. No murmur heard. Pulmonary:     Effort: Pulmonary effort is normal. No respiratory distress.     Breath sounds: Normal breath sounds. No wheezing, rhonchi or rales.  Abdominal:     General: Bowel sounds are normal. There is no distension.     Palpations: Abdomen is soft. There is no mass.     Tenderness: There is no abdominal tenderness. There is no guarding or rebound.     Hernia: No hernia is present.     Comments: Diastasis recti present  Musculoskeletal:        General: Normal range of motion.     Cervical back: Normal range of motion and neck supple.     Right lower leg: No edema.     Left lower leg: No edema.     Comments: Diminished pulses  Scar present to medial L ankle at site of prior vein harvesting 1990s  Lymphadenopathy:     Cervical: No cervical adenopathy.  Skin:    General: Skin is warm and dry.     Findings: No rash.  Neurological:     General: No focal deficit present.     Mental Status: He is alert and oriented to person, place, and time.  Psychiatric:        Mood and Affect: Mood normal.        Behavior: Behavior normal.        Thought Content: Thought content normal.  Judgment: Judgment normal.       Results for orders placed or performed in visit on 02/21/22  Hemoglobin A1c  Result Value Ref Range   Hgb A1c MFr Bld 6.5 4.6 - 6.5 %  VITAMIN D 25 Hydroxy (Vit-D Deficiency, Fractures)  Result Value Ref Range   VITD 41.77 30.00 - 100.00 ng/mL  Vitamin B12  Result Value Ref Range   Vitamin B-12 1,059  (H) 211 - 911 pg/mL  Comprehensive metabolic panel  Result Value Ref Range   Sodium 138 135 - 145 mEq/L   Potassium 4.4 3.5 - 5.1 mEq/L   Chloride 104 96 - 112 mEq/L   CO2 26 19 - 32 mEq/L   Glucose, Bld 101 (H) 70 - 99 mg/dL   BUN 29 (H) 6 - 23 mg/dL   Creatinine, Ser 1.42 0.40 - 1.50 mg/dL   Total Bilirubin 0.5 0.2 - 1.2 mg/dL   Alkaline Phosphatase 43 39 - 117 U/L   AST 13 0 - 37 U/L   ALT 10 0 - 53 U/L   Total Protein 5.9 (L) 6.0 - 8.3 g/dL   Albumin 4.2 3.5 - 5.2 g/dL   GFR 46.54 (L) >60.00 mL/min   Calcium 8.7 8.4 - 10.5 mg/dL  CBC with Differential/Platelet  Result Value Ref Range   WBC 5.2 4.0 - 10.5 K/uL   RBC 3.74 (L) 4.22 - 5.81 Mil/uL   Hemoglobin 12.3 (L) 13.0 - 17.0 g/dL   HCT 35.9 (L) 39.0 - 52.0 %   MCV 95.8 78.0 - 100.0 fl   MCHC 34.2 30.0 - 36.0 g/dL   RDW 13.6 11.5 - 15.5 %   Platelets 148.0 (L) 150.0 - 400.0 K/uL   Neutrophils Relative % 55.9 43.0 - 77.0 %   Lymphocytes Relative 27.9 12.0 - 46.0 %   Monocytes Relative 9.4 3.0 - 12.0 %   Eosinophils Relative 6.1 (H) 0.0 - 5.0 %   Basophils Relative 0.7 0.0 - 3.0 %   Neutro Abs 2.9 1.4 - 7.7 K/uL   Lymphs Abs 1.4 0.7 - 4.0 K/uL   Monocytes Absolute 0.5 0.1 - 1.0 K/uL   Eosinophils Absolute 0.3 0.0 - 0.7 K/uL   Basophils Absolute 0.0 0.0 - 0.1 K/uL  Lipid panel  Result Value Ref Range   Cholesterol 98 0 - 200 mg/dL   Triglycerides 83.0 0.0 - 149.0 mg/dL   HDL 32.20 (L) >39.00 mg/dL   VLDL 16.6 0.0 - 40.0 mg/dL   LDL Cholesterol 49 0 - 99 mg/dL   Total CHOL/HDL Ratio 3    NonHDL 66.02     Assessment & Plan:   Problem List Items Addressed This Visit     Health maintenance examination - Primary (Chronic)    Preventative protocols reviewed and updated unless pt declined. Discussed healthy diet and lifestyle.       Hyperlipidemia    Chronic, stable on atorvastatin and zetia. The ASCVD Risk score (Arnett DK, et al., 2019) failed to calculate for the following reasons:   The 2019 ASCVD risk score  is only valid for ages 97 to 14       Essential hypertension    Chronic, stable on current regimen - losartan '25mg'$  daily, imdur '15mg'$  daily, and lasix Q3 days.       CAD, ARTERY BYPASS GRAFT    Continues aspirin, statin, zetia imdur and ranexa       Carotid stenosis    No bruits heard. Continue to monitor. Last carotid US 2018  PAD (peripheral artery disease) (HCC)    Known PAD, sees [place]. Continue statin , zetia, aspirin.       GERD    Continues pantoprazole '40mg'$  BID in h/o breakthrough symptoms when once daily.  Will try dropping to QD dosing given CKD.       Benign prostatic hyperplasia with urinary obstruction    Sees urology regularly in h/o bladder cancer - continues flomax.       Lumbar disc disease with radiculopathy    S/p bilateral redo laminectomy (instrumentation and fusion) 2021 [name]).       Type 2 diabetes mellitus with other specified complication ([place])    Discussed recent A1c's remaining in controlled diabetes range.  Encouraged low s ugar low carb diabetic diet.       Ex-smoker    Quit smoking remotely 1990s       Low vitamin B12 level    S/p replacement - now levels overtreated.  Will hold b12 at this time.       Cervical stenosis of spinal canal    Ongoing difficulty, followed by neurosurgery.       Pedal edema    Chronic L>R. This is side of vein harvesting for prior CABG.      COPD (chronic obstructive pulmonary disease) (HCC)    Chronic, stable period on PRN albuterol, duoneb.       Peripheral neuropathy    Progressive to hands and feet, overall stable period on gabapentin '600mg'$  TID - continue.       CKD (chronic kidney disease) stage 3, GFR 30-59 ml/min (HCC)    Ongoing CKD over the last several months.  Reviewed with patient.  Reviewed limiting salt in diet, limiting NSAIDs and other nephrotoxins, and importance of maintaining good hydration.  Trial lower PPI dose.       OSA (obstructive sleep apnea)    Followed by  pulm.       Bladder cancer [facility])    S/p TURBT 10/2021  Has completed 6/6 BCG instillations ([facility]).  Appreciate uro care.         No orders of the defined types were placed in this encounter.  No orders of the defined types were placed in this encounter.    Patient instructions: Trial pantoprazole (protonix) once daily. Good to see you today Return as needed or in 6 months for follow up visit  Stay off B12 supplement at this time.   Follow up plan: Return in about 6 months (around 08/28/2022) for follow up visit.  Ria Bush, MD

## 2022-02-26 NOTE — Assessment & Plan Note (Signed)
Chronic, stable on atorvastatin and zetia. The ASCVD Risk score (Arnett DK, et al., 2019) failed to calculate for the following reasons:   The 2019 ASCVD risk score is only valid for ages 79 to 68

## 2022-02-26 NOTE — Assessment & Plan Note (Signed)
S/p TURBT 10/2021  Has completed 6/6 BCG instillations ([facility]).  Appreciate uro care.

## 2022-02-26 NOTE — Assessment & Plan Note (Addendum)
Followed by pulm.  

## 2022-02-26 NOTE — Assessment & Plan Note (Addendum)
Chronic, stable on current regimen - losartan '25mg'$  daily, imdur '15mg'$  daily, and lasix Q3 days.

## 2022-02-26 NOTE — Assessment & Plan Note (Signed)
Progressive to hands and feet, overall stable period on gabapentin '600mg'$  TID - continue.

## 2022-02-26 NOTE — Assessment & Plan Note (Signed)
Ongoing difficulty, followed by neurosurgery.

## 2022-02-26 NOTE — Assessment & Plan Note (Signed)
Discussed recent A1c's remaining in controlled diabetes range.  Encouraged low s ugar low carb diabetic diet.

## 2022-02-26 NOTE — Assessment & Plan Note (Addendum)
Chronic, stable period on PRN albuterol, duoneb.

## 2022-02-26 NOTE — Assessment & Plan Note (Signed)
S/p bilateral redo laminectomy (instrumentation and fusion) 2021 [name]).

## 2022-02-26 NOTE — Assessment & Plan Note (Signed)
Quit smoking remotely 1990s

## 2022-02-26 NOTE — Assessment & Plan Note (Signed)
Continues aspirin, statin, zetia imdur and ranexa

## 2022-02-26 NOTE — Assessment & Plan Note (Signed)
Known PAD, sees [place]. Continue statin , zetia, aspirin.

## 2022-02-26 NOTE — Assessment & Plan Note (Signed)
No bruits heard. Continue to monitor. Last carotid US 2018

## 2022-02-26 NOTE — Assessment & Plan Note (Signed)
S/p replacement - now levels overtreated.  Will hold b12 at this time.

## 2022-02-26 NOTE — Assessment & Plan Note (Signed)
Ongoing CKD over the last several months.  Reviewed with patient.  Reviewed limiting salt in diet, limiting NSAIDs and other nephrotoxins, and importance of maintaining good hydration.  Trial lower PPI dose.

## 2022-02-26 NOTE — Assessment & Plan Note (Signed)
Preventative protocols reviewed and updated unless pt declined. Discussed healthy diet and lifestyle.  

## 2022-02-26 NOTE — Assessment & Plan Note (Addendum)
Continues pantoprazole '40mg'$  BID in h/o breakthrough symptoms when once daily.  Will try dropping to QD dosing given CKD.

## 2022-02-26 NOTE — Assessment & Plan Note (Signed)
Chronic L>R. This is side of vein harvesting for prior CABG.

## 2022-02-26 NOTE — Assessment & Plan Note (Signed)
Sees urology regularly in h/o bladder cancer - continues flomax.

## 2022-03-01 ENCOUNTER — Encounter: Payer: PPO | Admitting: Physical Therapy

## 2022-03-01 DIAGNOSIS — S76312A Strain of muscle, fascia and tendon of the posterior muscle group at thigh level, left thigh, initial encounter: Secondary | ICD-10-CM | POA: Diagnosis not present

## 2022-03-10 DIAGNOSIS — J449 Chronic obstructive pulmonary disease, unspecified: Secondary | ICD-10-CM | POA: Diagnosis not present

## 2022-03-10 DIAGNOSIS — M1710 Unilateral primary osteoarthritis, unspecified knee: Secondary | ICD-10-CM | POA: Diagnosis not present

## 2022-03-15 ENCOUNTER — Other Ambulatory Visit: Payer: Self-pay | Admitting: Urology

## 2022-03-15 ENCOUNTER — Other Ambulatory Visit: Payer: Self-pay | Admitting: Student

## 2022-03-15 DIAGNOSIS — M4802 Spinal stenosis, cervical region: Secondary | ICD-10-CM

## 2022-03-16 ENCOUNTER — Ambulatory Visit: Payer: PPO | Attending: Physician Assistant | Admitting: Physical Therapy

## 2022-03-16 DIAGNOSIS — M25552 Pain in left hip: Secondary | ICD-10-CM | POA: Insufficient documentation

## 2022-03-16 DIAGNOSIS — M25551 Pain in right hip: Secondary | ICD-10-CM | POA: Diagnosis not present

## 2022-03-16 NOTE — Therapy (Addendum)
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 06/2019 Echo (in setting of COVID): EF 35-40%, mild LVH, g1 DD, glob HK; b. 09/2019 Echo: EF 50-55%, Gr1 DD; c. 03/2021 Echo: EF 50-55%, no rwma, mild LVH, GrI DD, nl RV fxn. RVSP 44.81mHg. Mild-mod dil LA. Triv MR. Mild-mod Ao sclerosis.   AAA (abdominal aortic aneurysm) (HCC)    Actinic keratosis    Anginal pain ([place]    BENIGN PROSTATIC HYPERTROPHY, WITH URINARY OBSTRUCTION 09/06/2007   CAD s/p CABG    a. 1990 s/p MI-->CABG x 2; b. 2002 s/p BMS to LCX; c. 06/2015 Cath: LM 50ost, LAD 100ost/p, 636m, D2 nl, LCX  patent stent, RCA 80p (small), LIMA->LAD nl, VG->D2 nl-->Med Rx; d. 05/2020 MV: fixed apical ant/apical defect. No signif ischemia; e. 04/2021 MV: EF 52%, no ischemia.   Carotid arterial disease ([facility]   a. 08/2016 Carotid U/S: <39% bilat.   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   CKD (chronic kidney disease), stage III ([place]   Community acquired pneumonia of right lower lobe of lung 06/05/2017   COVID-19 virus infection 05/30/2019   Covid PNA with hospitalization 05/2019   DUPUYTREN'S CONTRACTURE, 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   DVT, HX OF 1998   Emphysema of lung ([facility]   GERD 04/30/2007   Headache    hx migraines   History of hiatal hernia    History of shingles    HYPERLIPIDEMIA 04/26/2007   HYPERTENSION 04/30/2007   INGUINAL HERNIA, RIGHT 05/26/2010   Laceration of skin of left hand 03/08/2018   Lumbar disc disease with radiculopathy    neuropathy in feet   Myocardial infarction ([facility]   1989, 2002   OSA (obstructive sleep apnea)    a. did not tolerate CPAP.   Osteoarthritis    PAD (peripheral artery disease) ([place]   a. 07/2017 LE duplex: RSFA 75-9960mSFA 75-50m5m-74d;  c. 08/2018 Periph Angio: No signif AoIliac dzs. Mod-sev Ca2+ RSFA w/ diff dzs throughout- 3 vessel runoff. Borderline signif LSFA dzs w/ mod-sev Ca2+ vessels and 3 vessel runoff below the knee-->Med rx.   Pneumonia 07/2014   [facility] hospitalization   Pneumonia due to COVID-19 virus 06/02/2019   Pre-diabetes    Prostatitis, chronic    PSA, INCREASED 07/09/2008   Spinal stenosis of lumbar region    Past Surgical History:  Procedure Laterality Date   ABDOMINAL AORTOGRAM W/LOWER EXTREMITY [name] 09/12/2018   Procedure: ABDOMINAL AORTOGRAM W/LOWER EXTREMITY;  Surgeon: AridWellington Hampshire;  [facility];  Service: Cardiovascular;  Laterality: [name];   APPENDECTOMY     rupture   BACK SURGERY     1984 and then [facility]   BLADDER INSTILLATION [name] 10/19/2021   Procedure: BLADDER INSTILLATION OF GEMCITABINE;  Surgeon: StoiAbbie Sons;  Location: [facility];  Service: Urology;  Laterality: [name];   CARDIAC CATHETERIZATION [name] 07/07/2015   Procedure: Left Heart Cath and Cors/Grafts Angiography;  Surgeon: TimoMinna Merritts;  [facility];  Service: Cardiovascular;  Laterality: [name];   CATARACT EXTRACTION, BILATERAL  2009   CERVICAL SPINE SURGERY  12/2016   cervical stenosis ([place]old[facility] Morale[place]ONOSCOPY  03/2010   HP polyp, diverticulosis, rpt 10 yrs ([name])  CORONARY ANGIOPLASTY  11/30/2000   LCX   CORONARY ARTERY BYPASS GRAFT  1990   3 vessel    CYSTOSCOPY  12/23/2010   [facility]   EYE SURGERY     cataract   JOINT REPLACEMENT     KNEE ARTHROSCOPY Right    x2   LAMINOTOMY  1986   L5/S1 lumbar laminotomy for two ruptured discs/fusion   LUMBAR LAMINECTOMY/DECOMPRESSION MICRODISCECTOMY [name] 07/13/2016   Procedure: LUMBAR TWO-THREE, LUMBAR THREE-FOUR, LUMBAR FOUR-FIVE LAMINECTOMY AND FORAMINOTOMY;  Surgeon: [name], MD;  Location: [facility];  Service: Neurosurgery;  Laterality: [name];  LAMINECTOMY AND FORAMINOTOMY L2-L3, L3-L4,L4-L5   LUMBAR SPINE SURGERY   06/2020   Bilateral redo laminectomy/laminotomy/foraminotomies/medial facetectomy to decompress the bilateral L4 and L5 nerve roots [name])   TOTAL HIP ARTHROPLASTY Bilateral [id number]   TOTAL KNEE ARTHROPLASTY Right 03/06/2017   Procedure: RIGHT TOTAL KNEE ARTHROPLASTY;  Surgeon: Gaynelle Arabian, MD;  Location: [facility];  Service: Orthopedics;  Laterality: Right;   TRANSURETHRAL RESECTION OF BLADDER TUMOR [name] 10/19/2021   Procedure: TRANSURETHRAL RESECTION OF BLADDER TUMOR (TURBT);  Surgeon: Abbie Sons, MD;  Location: [facility];  Service: Urology;  Laterality: [name];   Patient Active Problem List   Diagnosis Date Noted   Right low back pain 12/29/2021   COPD exacerbation ([place]) 11/12/2021   Bladder cancer ([place]) 11/12/2021   Acute-on-chronic kidney injury ([place]) 07/09/2021   OSA (obstructive sleep apnea)    Acute on chronic diastolic CHF (congestive heart failure) ([place])    Right leg pain 06/22/2021   Nocturnal hypoxia 06/11/2021   CKD (chronic kidney disease) stage 3, GFR 30-59 ml/min ([place]) 02/24/2021   Peripheral neuropathy 02/23/2021   Spondylolisthesis of lumbar region 07/07/2020   COPD (chronic obstructive pulmonary disease) ([place]) 01/14/2020   Coccydynia 09/20/2019   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[place]) 07/08/2019   Anxiety 06/04/2019   Chronic right shoulder pain 09/10/2018   Cervical stenosis of spinal canal 06/05/2017   Pedal edema 06/05/2017   Health maintenance examination 12/06/2016   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/13/2016   Overweight (BMI 25.0-29.9) 07/04/2016   Low vitamin B12 level 01/01/2016   Exertional dyspnea 07/07/2015   Medicare annual wellness visit, subsequent 07/03/2015   Advanced care planning/counseling discussion 07/03/2015   Acute recurrent frontal sinusitis 05/05/2015   Type 2 diabetes mellitus with other specified complication ([place]) 05/31/5101   Pleuritic chest pain 05/04/2015   Ex-smoker 05/04/2015   Other testicular  hypofunction 04/02/2013   Spermatocele 04/02/2013   Syncopal vertigo 11/04/2010   Lumbar disc disease with radiculopathy 11/04/2010   CAD, ARTERY BYPASS GRAFT 08/11/2009   PAD (peripheral artery disease) ([place]) 08/11/2009   DUPUYTREN'S CONTRACTURE, 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   Carotid stenosis 07/09/2008   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 He was lifting something heavy and heard and felt a large pop. He did an MRI done that "showed a tear in the hamstring" but more importantly that he had bladder cancer. He went through 6 weeks of treatment for this, and is just now able to address the proximal hamstring pain. Pain has gotten better since initial injury but is still present. Pain is a dull ache that is constant. Worst pain 4/10; best 0/10. Pain is worsened by sitting, bending forward to pick things up and getting things out of his garden. Pt is retired, but enjoys gardening and wood working. Pt reports 2 falls in past 6 months, one time tripping over a rock, tripped getting off his lawnmower but caught himself. Has some lingering SOB from having COVID in 2020, has RW and SPC at home because of this but does not use. Walking more limited by SOB  symptoms, unlimited with hamstring. Standing is unlimited. Pt denies N/V, B&B changes, unexplained weight fluctuation, saddle paresthesia, fever, night sweats, or unrelenting night pain at this time.   PAIN:  Are you having pain? Yes: NPRS scale: 3/10 Pain location: L proximal hamstring Pain description: dull ache; sharp with bending forward Aggravating factors: sitting on it, bending forward  Relieving factors: rest, ice   PRECAUTIONS: Fall  WEIGHT BEARING RESTRICTIONS No  FALLS:  Has patient fallen in last 6 months? Yes. Number of falls 2  LIVING ENVIRONMENT: Lives with: lives with their spouse Lives in: House/apartment Stairs: Yes: External: 5 steps; on right going up Has following equipment at home: Single point cane and Walker - 2 wheeled  OCCUPATION: retired  PLOF: Independent  PATIENT GOALS garden and do woodworking without pain   OBJECTIVE:   DIAGNOSTIC FINDINGS:  MRI hamstring at [facility] Jan 2022 with partial tear of prox hamstring (per pt). Several other imaging in chart  PATIENT SURVEYS:  FOTO 51 with goal of 60  SCREENING FOR RED FLAGS: Bowel or bladder incontinence: No Spinal tumors: No Cauda equina syndrome: No Compression fracture: No Abdominal aneurysm: No  COGNITION:  Overall cognitive status: Within functional limits for tasks assessed     SENSATION: WFL  MUSCLE LENGTH: Hamstrings: Right 145d deg; Left 120deg   POSTURE: decreased full hip ext bilat with decreased lumbar lordosis; lower crossed. Forward head, rounded shoulders, increased thoracic kyphosis  PALPATION: TTP with concordant pain to palpation of proximal hamstring insertion. Latent trigger points to mid glute max fiers over piriformis. No TTP at mid hamstring group muscle belly, distal insertion, of glute musculature  LUMBAR ROM:   Active  A/PROM  eval  Flexion WNL in sitting; in standing limited by hamstrings  Extension 50% limited  Right lateral flexion   Left  lateral flexion   Right rotation 50% limited  Left rotation 50% limited   (Blank rows = not tested)  LOWER EXTREMITY ROM:     Active/PROM Right eval Left eval  Hip flexion 100/100 100/100  Hip extension 0/10 0/10  Hip abduction Wnl wnl  Hip adduction wnl WNL  Hip internal rotation 20d both 24d both  Hip external rotation WNL WNL  Knee flexion 130 Both 128 Both  Knee extension WNL WNL   Ankle dorsiflexion 10d/18d 10d/18d  Ankle plantarflexion    Ankle inversion    Ankle eversion     (Blank rows = not tested)  Knee flex in elys position L: 95d  R 90d  LOWER EXTREMITY MMT:    MMT Right eval Left eval  Hip flexion 5 5  Hip extension 3- 2+  Hip abduction 4- 4-  Hip adduction    Hip internal rotation 24 20  Hip external rotation WNL WNL  Knee flexion 5 4 with concordant pain  Knee extension 5 5  Ankle dorsiflexion 5 5  Ankle plantarflexion    Ankle inversion    Ankle eversion     (Blank rows = not tested)  1RM Hamstring curl R 45# L 25#  LUMBAR SPECIAL TESTS:  FABER test: Negative and   FADIR TEST: NEGATIVE BUT WITH "STRETCH SENSATION  FUNCTIONAL TESTS:  5 times sit to stand: 19SEC 10 meter walk test: self selected 0.70ms fastest 1.029m SLS R: 21sec L 14sec Squat only to parallel before heel raise compensation is needed  GAIT:   5019fith no AD used decreased speed, decreased terminal hip ext bilat presenting with decreased step length bilat, decreased LLE stance time with shortened RLE step > LLE   TODAY'S TREATMENT   PT reviewed the following HEP with patient with patient able to demonstrate a set of the following with min cuing for correction needed. PT educated patient on parameters of therex (how/when to inc/decrease intensity, frequency, rep/set range, stretch hold time, and purpose of therex) with verbalized understanding.  Hamstring stretch 30-60sec Education on ecentric loading exercises, but to be initiated in PT instead of HEP for safety as  patient reports recent dizziness and difficulty with BP  PATIENT EDUCATION:  Education details: Patient was educated on diagnosis, anatomy and pathology involved, prognosis, role of PT, and was given an HEP, demonstrating exercise with proper form following verbal and tactile cues, and was given a paper hand out to continue exercise at home. Pt was educated on and agreed to plan of care.  Person educated: Patient Education method: Explanation, Demonstration, Tactile cues, and Handouts Education comprehension: verbalized understanding, returned demonstration, and verbal cues required   HOME EXERCISE PROGRAM: Gentle hamstring stretch  ASSESSMENT:  CLINICAL IMPRESSION: Pt is an 80 12ar old male presenting s/p L hamstring strain Jan 2023 at proximal insertion. Impairments in decreased R hamstring length, strength and power, decreased glute ext activation/strength, decreased static and dynamic balance, and pain. Activity limitations in forward bending, squatting, stooping, sitting; inhibiting full participation in ADLs and gardening. Would benefit from skilled PT to address above deficits and promote optimal return to PLOF.    OBJECTIVE IMPAIRMENTS Abnormal gait, decreased activity tolerance, decreased balance, decreased cognition, decreased coordination, decreased endurance, decreased knowledge of use of DME, decreased mobility, difficulty walking, decreased ROM, decreased strength, decreased safety awareness, increased fascial restrictions, increased muscle spasms, impaired flexibility, improper body mechanics, and postural dysfunction.   ACTIVITY LIMITATIONS carrying, lifting, bending, sitting, standing, squatting, and stairs  PARTICIPATION LIMITATIONS: meal prep, cleaning, laundry, community activity, and yard work  PERSONAL FACTORS Age, Education, Past/current experiences, Time since onset of injury/illness/exacerbation, and 1 comorbidity: N  are also affecting patient's functional outcome.    REHAB POTENTIAL: Good  CLINICAL DECISION MAKING: Evolving/moderate complexity  EVALUATION COMPLEXITY: Moderate   GOALS: Goals reviewed with patient? Yes  SHORT TERM GOALS: Target date: 05/06/2022  Pt will be independent with HEP in order to improve strength and balance in order to decrease fall risk and improve function at home and work.  Baseline: Goal status: INITIAL   LONG TERM GOALS: Target date: 06/03/2022  Pt will demonstrate 1RM hamstring curl knee flex of LLE of at least  40.5# in order to demonstrate 90% of RLE, PLOF strength to complete heavy outdoor ADLs Baseline: 03/16/22 25# Goal status: INITIAL  2.  Pt will decrease worst pain as reported on NPRS by at least 3 points in order to demonstrate clinically significant reduction in pain.  Baseline: 4/10 Goal status: INITIAL  3.  Patient will increase FOTO score to 60 to demonstrate predicted increase in functional mobility to complete ADLs  Baseline: 51 Goal status: INITIAL  4.  Pt will increase 10MWT to  at least 1.20.13 m/s in order to demonstrate clinically significant improvement in uninhibited community ambulation.   Baseline: self selected 0.30ms; fastest 1.057m Goal status: INITIAL  5. Pt will decrease 5TSTS by at least 3 seconds in order to demonstrate clinically significant improvement in LE strength   Baseline: 19sec Goal status: INITIAL       PLAN: PT FREQUENCY: 1-2x/week  PT DURATION: 8 weeks  PLANNED INTERVENTIONS: Therapeutic exercises, Therapeutic activity, Neuromuscular re-education, Balance training, Gait training, Patient/Family education, Self Care, Joint mobilization, Joint manipulation, Dry Needling, Cryotherapy, and Moist heat.  PLAN FOR NEXT SESSION: DGI, eccentric hamstring strengthening  ChDurwin RegesPT  ChDurwin RegesPT 04/08/2022, 9:52 AM

## 2022-03-17 DIAGNOSIS — G4733 Obstructive sleep apnea (adult) (pediatric): Secondary | ICD-10-CM | POA: Diagnosis not present

## 2022-03-17 DIAGNOSIS — R0683 Snoring: Secondary | ICD-10-CM | POA: Diagnosis not present

## 2022-03-17 DIAGNOSIS — I10 Essential (primary) hypertension: Secondary | ICD-10-CM | POA: Diagnosis not present

## 2022-03-18 ENCOUNTER — Encounter: Payer: Self-pay | Admitting: Physical Therapy

## 2022-03-18 ENCOUNTER — Encounter: Payer: Self-pay | Admitting: Cardiovascular Disease

## 2022-03-18 ENCOUNTER — Telehealth: Payer: Self-pay | Admitting: Cardiovascular Disease

## 2022-03-18 ENCOUNTER — Ambulatory Visit: Payer: PPO | Admitting: Physical Therapy

## 2022-03-18 DIAGNOSIS — M25552 Pain in left hip: Secondary | ICD-10-CM | POA: Diagnosis not present

## 2022-03-18 DIAGNOSIS — M25551 Pain in right hip: Secondary | ICD-10-CM

## 2022-03-18 NOTE — Telephone Encounter (Signed)
Spoke w/ pt.  He reports that his "BP has gone crazy" since his last visit w/ TG in June.  His SBP has been in the 140-160 range while his DBP has been in the 50-60s. PT will not allow him to participate w/ a low DBP. Pt was being treated for a torn hamstring, but then found he had bladder cancer; he has completed 6 treatments for this.  He was in the garden the other morning and felt weak & dizzy, so he came in and checked his BP: 98/50, so he sat for awhile and it came back up that afternoon. He is currently taking losartan 25 mg daily.  His PCP decreased his protonix dosage to help his kidneys. Pt has been staying hydrated and has not taken his lasix in a week.  He weighs himself twice daily and it has not changed.  He denies any swelling or SOB.  He has been sweating a lot.  Advised him that I will make Dr. Rockey Situ aware and call him back w/ his recommendation. He is appreciative of the call.

## 2022-03-18 NOTE — Telephone Encounter (Signed)
Pt c/o BP issue: STAT if pt c/o blurred vision, one-sided weakness or slurred speech  1. What are your last 5 BP readings? 153/57   80      147/54    78  2. Are you having any other symptoms (ex. Dizziness, headache, blurred vision, passed out)? Dizziness, weakness.   3. What is your BP issue? Patient states he was at PT this morning and got dizzy. They check his BP and saw the diastolic number was low, they stop PT at that time.  Patient states he has been having BP issues at home as well.  Did not have BP reading with him, but he said it has gotten as low as in the 90's/50's-60's.

## 2022-03-18 NOTE — Therapy (Addendum)
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 06/2019 Echo (in setting of COVID): EF 35-40%, mild LVH, g1 DD, glob HK; b. 09/2019 Echo: EF 50-55%, Gr1 DD; c. 03/2021 Echo: EF 50-55%, no rwma, mild LVH, GrI DD, nl RV fxn. RVSP 44.38mHg. Mild-mod dil LA. Triv MR. Mild-mod Ao sclerosis.   AAA (abdominal aortic aneurysm) (HCC)    Actinic keratosis    Anginal pain ([place]    BENIGN PROSTATIC HYPERTROPHY, WITH URINARY OBSTRUCTION 09/06/2007   CAD s/p CABG    a. 1990 s/p MI-->CABG x 2; b. 2002 s/p BMS to LCX; c. 06/2015 Cath: LM 50ost, LAD 100ost/p, 635m, D2 nl, LCX  patent stent, RCA 80p (small), LIMA->LAD nl, VG->D2 nl-->Med Rx; d. 05/2020 MV: fixed apical ant/apical defect. No signif ischemia; e. 04/2021 MV: EF 52%, no ischemia.   Carotid arterial disease ([facility]   a. 08/2016 Carotid U/S: <39% bilat.   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   CKD (chronic kidney disease), stage III ([place]   Community acquired pneumonia of right lower lobe of lung 06/05/2017   COVID-19 virus infection 05/30/2019   Covid PNA with hospitalization 05/2019   DUPUYTREN'S CONTRACTURE, 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   DVT, HX OF 1998   Emphysema of lung ([facility]   GERD 04/30/2007   Headache    hx migraines   History of hiatal hernia    History of shingles    HYPERLIPIDEMIA 04/26/2007   HYPERTENSION 04/30/2007   INGUINAL HERNIA, RIGHT 05/26/2010   Laceration of skin of left hand 03/08/2018   Lumbar disc disease with radiculopathy    neuropathy in feet   Myocardial infarction ([facility]   1989, 2002   OSA (obstructive sleep apnea)    a. did not tolerate CPAP.   Osteoarthritis    PAD (peripheral  artery disease) ([facility]   a. 07/2017 LE duplex: RSFA 75-9912mSFA 75-52m83m-74d; c. 08/2018 Periph Angio: No signif AoIliac dzs. Mod-sev Ca2+ RSFA w/ diff dzs throughout- 3 vessel runoff. Borderline signif LSFA dzs w/ mod-sev Ca2+ vessels and 3 vessel runoff below the knee-->Med rx.   Pneumonia 07/2014   [facility] hospitalization   Pneumonia due to COVID-19 virus 06/02/2019   Pre-diabetes    Prostatitis, chronic    PSA, INCREASED 07/09/2008   Spinal stenosis of lumbar region    Past Surgical History:  Procedure Laterality Date   ABDOMINAL AORTOGRAM W/LOWER EXTREMITY [name] 09/12/2018   Procedure: ABDOMINAL AORTOGRAM W/LOWER EXTREMITY;  Surgeon: AridWellington Hampshire;  [facility];  Service: Cardiovascular;  Laterality: [name];   APPENDECTOMY     rupture   BACK SURGERY     1984 and then [facility]   BLADDER INSTILLATION [name] 10/19/2021   Procedure: BLADDER INSTILLATION OF GEMCITABINE;  Surgeon: StoiAbbie Sons;  Location: [facility];  Service: Urology;  Laterality: [name];   CARDIAC CATHETERIZATION [name] 07/07/2015   Procedure: Left Heart Cath and Cors/Grafts Angiography;  Surgeon: TimoMinna Merritts;  [facility];  Service: Cardiovascular;  Laterality: [name];   CATARACT EXTRACTION, BILATERAL  2009   CERVICAL SPINE SURGERY  12/2016   cervical stenosis ([name]  COLONOSCOPY  03/2010   HP polyp, diverticulosis, rpt 10 yrs ([name])   CORONARY ANGIOPLASTY  11/30/2000   LCX   CORONARY ARTERY BYPASS GRAFT  1990   3 vessel    CYSTOSCOPY  12/23/2010   [facility]   EYE SURGERY     cataract   JOINT REPLACEMENT     KNEE ARTHROSCOPY Right    x2   LAMINOTOMY  1986   L5/S1 lumbar laminotomy for two ruptured discs/fusion   LUMBAR LAMINECTOMY/DECOMPRESSION MICRODISCECTOMY [name] 07/13/2016   Procedure: LUMBAR TWO-THREE, LUMBAR THREE-FOUR, LUMBAR FOUR-FIVE LAMINECTOMY AND FORAMINOTOMY;  Surgeon: [name], MD;  Location: [facility];  Service: Neurosurgery;  Laterality:  [name];  LAMINECTOMY AND FORAMINOTOMY L2-L3, L3-L4,L4-L5   LUMBAR SPINE SURGERY  06/2020   Bilateral redo laminectomy/laminotomy/foraminotomies/medial facetectomy to decompress the bilateral L4 and L5 nerve roots [name])   TOTAL HIP ARTHROPLASTY Bilateral [id number]   TOTAL KNEE ARTHROPLASTY Right 03/06/2017   Procedure: RIGHT TOTAL KNEE ARTHROPLASTY;  Surgeon: Gaynelle Arabian, MD;  Location: [facility];  Service: Orthopedics;  Laterality: Right;   TRANSURETHRAL RESECTION OF BLADDER TUMOR [name] 10/19/2021   Procedure: TRANSURETHRAL RESECTION OF BLADDER TUMOR (TURBT);  Surgeon: Abbie Sons, MD;  Location: [facility];  Service: Urology;  Laterality: [name];   Patient Active Problem List   Diagnosis Date Noted   Right low back pain 12/29/2021   COPD exacerbation ([place]) 11/12/2021   Bladder cancer ([place]) 11/12/2021   Acute-on-chronic kidney injury ([place]) 07/09/2021   OSA (obstructive sleep apnea)    Acute on chronic diastolic CHF (congestive heart failure) ([place])    Right leg pain 06/22/2021   Nocturnal hypoxia 06/11/2021   CKD (chronic kidney disease) stage 3, GFR 30-59 ml/min ([place]) 02/24/2021   Peripheral neuropathy 02/23/2021   Spondylolisthesis of lumbar region 07/07/2020   COPD (chronic obstructive pulmonary disease) ([place]) 01/14/2020   Coccydynia 09/20/2019   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[place]) 07/08/2019   Anxiety 06/04/2019   Chronic right shoulder pain 09/10/2018   Cervical stenosis of spinal canal 06/05/2017   Pedal edema 06/05/2017   Health maintenance examination 12/06/2016   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/13/2016   Overweight (BMI 25.0-29.9) 07/04/2016   Low vitamin B12 level 01/01/2016   Exertional dyspnea 07/07/2015   Medicare annual wellness visit, subsequent 07/03/2015   Advanced care planning/counseling discussion 07/03/2015   Acute recurrent frontal sinusitis 05/05/2015   Type 2 diabetes mellitus with other specified complication ([place]) 99/24/2683    Pleuritic chest pain 05/04/2015   Ex-smoker 05/04/2015   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   Spermatocele 04/02/2013   Syncopal vertigo 11/04/2010   Lumbar disc disease with radiculopathy 11/04/2010   CAD, ARTERY BYPASS GRAFT 08/11/2009   PAD (peripheral artery disease) ([place]) 08/11/2009   DUPUYTREN'S CONTRACTURE, 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   Carotid stenosis 07/09/2008   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 He was lifting something heavy and heard and felt a large pop. He did an MRI done that "showed a tear in the hamstring" but more importantly that he had bladder cancer. He went through 6 weeks of treatment  for this, and is just now able to address the proximal hamstring pain. Pain has gotten better since initial injury but is still present. Pain is a dull ache that is constant. Worst pain 4/10; best 0/10. Pain is worsened by sitting, bending forward to pick things up and getting things out of his garden. Pt is retired, but enjoys gardening and wood working. Pt reports 2 falls in past 6 months, one time tripping over a rock, tripped getting off his lawnmower but caught himself. Has some lingering SOB from having COVID in 2020, has RW and SPC at home because of this but does not use. Walking more limited by SOB symptoms, unlimited with hamstring. Standing is unlimited. Pt denies N/V, B&B changes, unexplained weight fluctuation, saddle paresthesia, fever, night sweats, or unrelenting night pain at this time.    PRECAUTIONS: none  SUBJECTIVE: Pt reports no pain  this morning. Reports he thinks his prednisone taper is helping this. Is completing his HEP without question or concern.   PAIN:  Are [name] having pain? No   OBJECTIVE: (objective measures completed at initial evaluation unless otherwise dated)  DIAGNOSTIC FINDINGS:  MRI hamstring at [facility] Jan 2022 with partial tear of prox hamstring (per pt). Several other imaging in chart   PATIENT SURVEYS:  FOTO 51 with goal of 60   SCREENING FOR RED FLAGS: Bowel or bladder incontinence: No Spinal tumors: No Cauda equina syndrome: No Compression fracture: No Abdominal aneurysm: No   COGNITION:           Overall cognitive status: Within functional limits for tasks assessed                          SENSATION: WFL   MUSCLE LENGTH: Hamstrings: Right 145d deg; Left 120deg     POSTURE: decreased full hip ext bilat with decreased lumbar lordosis; lower crossed. Forward head, rounded shoulders, increased thoracic kyphosis   PALPATION: TTP with concordant pain to palpation of proximal hamstring insertion. Latent trigger points to mid glute max fiers over piriformis. No TTP at mid hamstring group muscle belly, distal insertion, of glute musculature   LUMBAR ROM:    Active  A/PROM  eval  Flexion WNL in sitting; in standing limited by hamstrings  Extension 50% limited  Right lateral flexion    Left lateral flexion    Right rotation 50% limited  Left rotation 50% limited   (Blank rows = not tested)   LOWER EXTREMITY ROM:      Active/PROM Right eval Left eval  Hip flexion 100/100 100/100  Hip extension 0/10 0/10  Hip abduction Wnl wnl  Hip adduction wnl WNL  Hip internal rotation 24d both 20d both  Hip external rotation WNL WNL  Knee flexion 128 Both 130 Both  Knee extension WNL WNL   Ankle dorsiflexion 10d/18d 10d/18d  Ankle plantarflexion      Ankle inversion      Ankle eversion       (Blank rows = not tested)   Knee flex in elys position L: 95d R 90d    LOWER  EXTREMITY MMT:     MMT Right eval Left eval  Hip flexion 5 5  Hip extension 3- 2+  Hip abduction 4- 4-  Hip adduction      Hip internal rotation 24 20  Hip external rotation WNL WNL  Knee flexion 5 4 with concordant pain  Knee extension 5 5  Ankle dorsiflexion 5 5  Ankle plantarflexion  Ankle inversion      Ankle eversion       (Blank rows = not tested)   1RM Hamstring curl R 45# L 25#   LUMBAR SPECIAL TESTS:  FABER test: Negative and   FADIR TEST: NEGATIVE BUT WITH "STRETCH SENSATION   FUNCTIONAL TESTS:  5 times sit to stand: 19SEC 10 meter walk test: self selected 0.82ms fastest 1.040m SLS R: 14sec L: 21sec Squat only to parallel before heel raise compensation is needed   GAIT:              5042fith no AD used decreased speed, decreased terminal hip ext bilat presenting with decreased step length bilat, decreased LLE stance time with shortened RLE step > LLE     TODAY'S TREATMENT  Todays Intervention  Nustep seat 10 BLEs L3 for gentle strengthening Good morning with patient reporting dizziness at 4th rep. Seated BP and pulse taken:   153/57 pulse 80 147/54 pulse 78  Session ceased. PT verbally reviewed HEP with patient.   PATIENT EDUCATION:  Education details: HEP review. Education on need to reach out to PCP about BP concerns with pt understanding Person educated: Patient Education method: Explanation, Demonstration, Tactile cues, and Handouts Education comprehension: verbalized understanding, returned demonstration, and verbal cues required     HOME EXERCISE PROGRAM: Gentle hamstring stretch   ASSESSMENT:   CLINICAL IMPRESSION: PT initiated therex progression, with PT BP inhibiting progression with contraindicated hypotension. PT reviewed HEP with patient with understanding and on progression of PT with understanding. PT encouraged patient to reach out to PCP for BP control, and to seek further medical help should symptoms arise again. PT will  continue progression as able.      OBJECTIVE IMPAIRMENTS Abnormal gait, decreased activity tolerance, decreased balance, decreased cognition, decreased coordination, decreased endurance, decreased knowledge of use of DME, decreased mobility, difficulty walking, decreased ROM, decreased strength, decreased safety awareness, increased fascial restrictions, increased muscle spasms, impaired flexibility, improper body mechanics, and postural dysfunction.    ACTIVITY LIMITATIONS carrying, lifting, bending, sitting, standing, squatting, and stairs   PARTICIPATION LIMITATIONS: meal prep, cleaning, laundry, community activity, and yard work   PERSONAL FACTORS Age, Education, Past/current experiences, Time since onset of injury/illness/exacerbation, and 1 comorbidity: N  are also affecting patient's functional outcome.    REHAB POTENTIAL: Good   CLINICAL DECISION MAKING: Evolving/moderate complexity   EVALUATION COMPLEXITY: Moderate     GOALS: Goals reviewed with patient? Yes   SHORT TERM GOALS: Target date: 04/15/2022   Pt will be independent with HEP in order to improve strength and balance in order to decrease fall risk and improve function at home and work.   Baseline: Goal status: INITIAL     LONG TERM GOALS: Target date: 05/13/2022   Pt will demonstrate 1RM hamstring curl knee flex of LLE of at least 40.5# in order to demonstrate 90% of RLE, PLOF strength to complete heavy outdoor ADLs Baseline: 03/16/22 25# Goal status: INITIAL   2.  Pt will decrease worst pain as reported on NPRS by at least 3 points in order to demonstrate clinically significant reduction in pain.   Baseline:  Goal status: INITIAL   3.  Patient will increase FOTO score to 60 to demonstrate predicted increase in functional mobility to complete ADLs   Baseline: 51 Goal status: INITIAL   4.  Pt will increase 10MWT to  at least 1.20.13 m/s in order to demonstrate clinically significant improvement in uninhibited  community ambulation.    Baseline:  self selected 0.1ms; fastest 1.09m Goal status: INITIAL   5. Pt will decrease 5TSTS by at least 3 seconds in order to demonstrate clinically significant improvement in LE strength   Baseline: 19sec Goal status: INITIAL                     PLAN: PT FREQUENCY: 1-2x/week   PT DURATION: 8 weeks   PLANNED INTERVENTIONS: Therapeutic exercises, Therapeutic activity, Neuromuscular re-education, Balance training, Gait training, Patient/Family education, Self Care, Joint mobilization, Joint manipulation, Dry Needling, Cryotherapy, and Moist heat.   PLAN FOR NEXT SESSION: DGI, eccentric hamstring strengthening       ChDurwin RegesPT ChDurwin RegesPT 04/08/2022, 9:54 AM

## 2022-03-18 NOTE — Telephone Encounter (Signed)
Sent recommendations via reply to [employer] message pt sent

## 2022-03-22 ENCOUNTER — Ambulatory Visit: Payer: PPO | Admitting: Pulmonary Disease

## 2022-03-23 ENCOUNTER — Ambulatory Visit: Payer: PPO | Admitting: Physical Therapy

## 2022-03-23 ENCOUNTER — Ambulatory Visit
Admission: RE | Admit: 2022-03-23 | Discharge: 2022-03-23 | Disposition: A | Discharge status: Home / Self Care | Payer: PPO | Source: Ambulatory Visit | Attending: Student | Admitting: Student

## 2022-03-23 DIAGNOSIS — M542 Cervicalgia: Secondary | ICD-10-CM | POA: Diagnosis not present

## 2022-03-23 DIAGNOSIS — M4802 Spinal stenosis, cervical region: Secondary | ICD-10-CM | POA: Diagnosis not present

## 2022-03-25 ENCOUNTER — Ambulatory Visit: Payer: PPO | Admitting: Physical Therapy

## 2022-03-25 ENCOUNTER — Encounter: Payer: Self-pay | Admitting: Physical Therapy

## 2022-03-25 ENCOUNTER — Ambulatory Visit: Payer: PPO | Admitting: Urology

## 2022-03-25 ENCOUNTER — Encounter: Payer: Self-pay | Admitting: Urology

## 2022-03-25 VITALS — BP 142/77 | HR 68 | Ht 72.0 in | Wt 205.0 lb

## 2022-03-25 DIAGNOSIS — N138 Other obstructive and reflux uropathy: Secondary | ICD-10-CM

## 2022-03-25 DIAGNOSIS — C679 Malignant neoplasm of bladder, unspecified: Secondary | ICD-10-CM

## 2022-03-25 DIAGNOSIS — N401 Enlarged prostate with lower urinary tract symptoms: Secondary | ICD-10-CM | POA: Diagnosis not present

## 2022-03-25 DIAGNOSIS — M25551 Pain in right hip: Secondary | ICD-10-CM

## 2022-03-25 DIAGNOSIS — M25552 Pain in left hip: Secondary | ICD-10-CM | POA: Diagnosis not present

## 2022-03-25 LAB — MICROSCOPIC EXAMINATION
Bacteria, UA: NONE SEEN
RBC, Urine: NONE SEEN /hpf (ref 0–2)

## 2022-03-25 LAB — URINALYSIS, COMPLETE
Bilirubin, UA: NEGATIVE
Glucose, UA: NEGATIVE
Ketones, UA: NEGATIVE
Leukocytes,UA: NEGATIVE
Nitrite, UA: NEGATIVE
Protein,UA: NEGATIVE
RBC, UA: NEGATIVE
Specific Gravity, UA: 1.015 (ref 1.005–1.030)
Urobilinogen, Ur: 0.2 mg/dL (ref 0.2–1.0)
pH, UA: 5.5 (ref 5.0–7.5)

## 2022-03-25 LAB — BLADDER SCAN AMB NON-IMAGING: SCA Result: 102

## 2022-03-25 NOTE — Progress Notes (Signed)
03/25/2022 11:03 AM   [name] 1941-01-16 [id number]  Referring provider: Ria Bush, MD [place],  [place] [address]  Chief Complaint  Patient presents with   Benign Prostatic Hypertrophy    HPI: 81 y.o. male resents for follow-up visit.  Initially seen August 2022 for transfer of urologic care from [facility] in [place] for BPH with both nonobstructive and storage related voiding symptoms He had been scheduled for a 1 year follow-up office visit however was seen January 2023 after a pelvic MRI which was ordered by orthopedics was incidentally noted to have a 15 x 20 mm bladder mass near the right UO Office cystoscopy did show a ~ 2 cm papillary tumor lateral to the right UO TURBT 10/19/2021 was performed with pathology showing high-grade noninvasive urothelial carcinoma and a low-grade tumor on the left posterolateral wall 6-week course of BCG induction was completed 01/20/2022 and he is scheduled for office cystoscopy 04/21/2022 His lower urinary tract symptoms are stable and he remains on tamsulosin and tolterodine   PMH: Past Medical History:  Diagnosis Date   ([place]) heart failure with improved ejection fraction ([place])    a. 06/2019 Echo (in setting of COVID): EF 35-40%, mild LVH, g1 DD, glob HK; b. 09/2019 Echo: EF 50-55%, Gr1 DD; c. 03/2021 Echo: EF 50-55%, no rwma, mild LVH, GrI DD, nl RV fxn. RVSP 44.62mHg. Mild-mod dil LA. Triv MR. Mild-mod Ao sclerosis.   AAA (abdominal aortic aneurysm) (HCC)    Actinic keratosis    Anginal pain ([place]    BENIGN PROSTATIC HYPERTROPHY, WITH URINARY OBSTRUCTION 09/06/2007   CAD s/p CABG    a. 1990 s/p MI-->CABG x 2; b. 2002 s/p BMS to LCX; c. 06/2015 Cath: LM 50ost, LAD 100ost/p, 625m, D2 nl, LCX  patent stent, RCA 80p (small), LIMA->LAD nl, VG->D2 nl-->Med Rx; d. 05/2020 MV: fixed apical ant/apical defect. No signif ischemia; e. 04/2021 MV: EF 52%, no ischemia.   Carotid arterial disease ([facility]    a. 08/2016 Carotid U/S: <39% bilat.   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 did not tolerate CPAP.   Osteoarthritis    PAD (peripheral artery disease) ([facility]   a. 07/2017 LE duplex: RSFA [id number] 75-60m27m-74d; c. 08/2018 Periph Angio: No signif AoIliac dzs. Mod-sev Ca2+ RSFA w/ diff dzs throughout- 3 vessel runoff. Borderline signif LSFA dzs w/ mod-sev Ca2+ vessels and 3 vessel runoff below the knee-->Med rx.   Pneumonia 07/2014   [facility] hospitalization   Pneumonia due to COVID-19 virus 06/02/2019   Pre-diabetes    Prostatitis, chronic    PSA, INCREASED 07/09/2008   Spinal stenosis of lumbar region  Surgical History: Past Surgical History:  Procedure Laterality Date   ABDOMINAL AORTOGRAM W/LOWER EXTREMITY [name] 09/12/2018   Procedure: ABDOMINAL AORTOGRAM W/LOWER EXTREMITY;  Surgeon: [name], MD;  Location: [facility];  Service: Cardiovascular;  Laterality: [name];   APPENDECTOMY     rupture   BACK SURGERY     1984 and then [facility]   BLADDER INSTILLATION [name] 10/19/2021   Procedure: BLADDER INSTILLATION OF GEMCITABINE;  Surgeon: Abbie Sons, MD;  Location: [facility];  Service: Urology;  Laterality: [name];   CARDIAC CATHETERIZATION [name] 07/07/2015   Procedure:  Left Heart Cath and Cors/Grafts Angiography;  Surgeon: [name], MD;  Location: [facility];  Service: Cardiovascular;  Laterality: [name];   CATARACT EXTRACTION, BILATERAL  2009   CERVICAL SPINE SURGERY  12/2016   cervical stenosis [name])   COLONOSCOPY  03/2010   HP polyp, diverticulosis, rpt 10 yrs ([name])   CORONARY ANGIOPLASTY  11/30/2000   [facility]X   CORONARY ARTERY BYPASS GRAFT  1990   3 vessel    CYSTOSCOPY  12/23/2010   [facility]   EYE SURGERY     cataract   JOINT REPLACEMENT     KNEE ARTHROSCOPY Right    x2   LAMINOTOMY  1986   L5/S1 lumbar laminotomy for two ruptured discs/fusion   LUMBAR LAMINECTOMY/DECOMPRESSION MICRODISCECTOMY [name] 07/13/2016   Procedure: LUMBAR TWO-THREE, LUMBAR THREE-FOUR, LUMBAR FOUR-FIVE LAMINECTOMY AND FORAMINOTOMY;  Surgeon: [name], MD;  Location: [facility];  Service: Neurosurgery;  Laterality: [name];  LAMINECTOMY AND FORAMINOTOMY L2-L3, L3-L4,L4-L5   LUMBAR SPINE SURGERY  06/2020   Bilateral redo laminectomy/laminotomy/foraminotomies/medial facetectomy to decompress the bilateral L4 and L5 nerve roots [name])   TOTAL HIP ARTHROPLASTY Bilateral [id number]   TOTAL KNEE ARTHROPLASTY Right 03/06/2017   Procedure: RIGHT TOTAL KNEE ARTHROPLASTY;  Surgeon: Gaynelle Arabian, MD;  Location: [facility];  Service: Orthopedics;  Laterality: Right;   TRANSURETHRAL RESECTION OF BLADDER TUMOR [name] 10/19/2021   Procedure: TRANSURETHRAL RESECTION OF BLADDER TUMOR (TURBT);  Surgeon: Abbie Sons, MD;  Location: [facility];  Service: Urology;  Laterality: [name];    Home Medications:  Allergies as of 03/25/2022       Reactions   Vioxx [rofecoxib] Other (See Comments)   Hemorrhage    Spiriva Respimat [tiotropium Bromide Monohydrate] Other (See Comments)   Elevated bp   Contrast Media [iodinated Contrast Media] Itching, Rash   Delayed reaction post abdominal aortagram.    Morphine Nausea Only, Other (See Comments)   Irritability        Medication List         Accurate as of March 25, 2022 11:03 AM. If you have any questions, ask your nurse or doctor.          acetaminophen 500 MG tablet Commonly known as: TYLENOL Take 1,000 mg by mouth every 8 (eight) hours as needed for moderate pain.   aspirin EC 81 MG tablet Take 1 tablet (81 mg total) by mouth daily.   atorvastatin 20 MG tablet Commonly known as: LIPITOR TAKE 1 TABLET BY MOUTH DAILY.   Azelastine HCl 0.15 % Soln Commonly known as: Astepro Place 1 spray into the nose at bedtime as needed (Sinus congestion, allergies).   Compressor/Nebulizer Misc Use as directed.   cyclobenzaprine 5 MG tablet Commonly known as: FLEXERIL Take 1 tablet (5 mg total) by mouth 2 (two) times daily as needed for muscle spasms (sedation precautions).   docusate sodium 100 MG capsule Commonly known as:  COLACE Take 100 mg by mouth 2 (two) times daily as needed for mild constipation.   ezetimibe 10 MG tablet Commonly known as: ZETIA TAKE 1 TABLET BY MOUTH ONCE DAILY. What changed: when to take this   fluticasone 50 MCG/ACT nasal spray Commonly known as: FLONASE Place 1 spray into both nostrils as needed for allergies or rhinitis.   furosemide 20 MG tablet Commonly known as: LASIX Take 1 tablet by mouth every third day as directed.   gabapentin 600 MG tablet Commonly known as: NEURONTIN Take 600 mg by mouth 3 (three) times daily.   ipratropium-albuterol 0.5-2.5 (3) MG/3ML Soln Commonly known as: DUONEB Take 3 mLs by nebulization every 6 (six) hours as needed.   isosorbide mononitrate 30 MG 24 hr tablet Commonly known as: IMDUR Take 0.5 tablets (15 mg total) by mouth daily.   losartan 25 MG tablet Commonly known as: COZAAR TAKE 1 TABLET BY MOUTH DAILY   nitroGLYCERIN 0.4 MG SL tablet Commonly known as: NITROSTAT Place 1 tablet (0.4 mg total) under the tongue every 5 (five) minutes as needed for chest pain.   OVER THE COUNTER MEDICATION Apply 1 application topically 2 (two)  times daily as needed (pain). Cryotherapy roll on gel   OXYGEN Inhale 2 L into the lungs at bedtime.   pantoprazole 40 MG tablet Commonly known as: PROTONIX Take 40 mg by mouth 2 (two) times daily.   polyethylene glycol 17 g packet Commonly known as: MIRALAX / GLYCOLAX Take 17 g by mouth daily as needed for mild constipation.   ProAir HFA 108 (90 Base) MCG/ACT inhaler Generic drug: albuterol TAKE 2 PUFFS INTO LUNGS EVERY 6 HOURS ASNEEDED FOR WHEEZING OR SHORTNESS OF BREATH   ranolazine 500 MG 12 hr tablet Commonly known as: RANEXA Take 1 tablet (500 mg total) by mouth 2 (two) times daily.   tamsulosin 0.4 MG Caps capsule Commonly known as: FLOMAX TAKE 1 CAPSULE BY MOUTH AT BEDTIME   tolterodine 4 MG 24 hr capsule Commonly known as: DETROL LA TAKE 1 CAPSULE EVERY DAY        Allergies:  Allergies  Allergen Reactions   Vioxx [Rofecoxib] Other (See Comments)    Hemorrhage    Spiriva Respimat [Tiotropium Bromide Monohydrate] Other (See Comments)    Elevated bp   Contrast Media [Iodinated Contrast Media] Itching and Rash    Delayed reaction post abdominal aortagram.    Morphine Nausea Only and Other (See Comments)    Irritability     Family History: Family History  Problem Relation Age of Onset   Stroke Mother    Heart attack Mother    Diabetes Mother    Stroke Father    Heart disease Father    Polycystic kidney disease Father    Cancer Sister        throat   Polycystic kidney disease Sister    Diabetes Brother        1/2 brother   Cancer Other        5/7 nephews with lung cancer    Social History:  reports that he quit smoking about 33 years ago. His smoking use included cigarettes. He has a 25.00 pack-year smoking history. He has never used smokeless tobacco. He reports that he does not drink alcohol and does not use drugs.   Physical Exam: BP (!) 142/77   Pulse 68   Ht 6' (1.829 m)   Wt 205 lb (93 kg)   BMI 27.80 kg/m   Constitutional:  Alert  and oriented, No acute distress. HEENT: [place] AT Respiratory: Normal respiratory effort, no increased work of breathing.    Assessment & Plan:    1.  BPH with LUTS Stable lower urinary tract symptoms PVR today 102 mL UA negative Continue tamsulosin/tolterodine  2.  High-grade Ta urothelial carcinoma bladder Scheduled for post BCG cystoscopy next month He never had upper tract imaging and will schedule a MR urogram as he has a history of contrast allergy and CKD   Abbie Sons, MD  [facility] [address], [place], [place] [address] [phone]

## 2022-03-25 NOTE — Therapy (Addendum)
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 06/2019 Echo (in setting of COVID): EF 35-40%, mild LVH, g1 DD, glob HK; b. 09/2019 Echo: EF 50-55%, Gr1 DD; c. 03/2021 Echo: EF 50-55%, no rwma, mild LVH, GrI DD, nl RV fxn. RVSP 44.62mHg. Mild-mod dil LA. Triv MR. Mild-mod Ao sclerosis.   AAA (abdominal aortic aneurysm) (HCC)    Actinic keratosis    Anginal pain ([place]    BENIGN PROSTATIC HYPERTROPHY, WITH URINARY OBSTRUCTION 09/06/2007   CAD s/p CABG    a. 1990 s/p MI-->CABG x 2; b. 2002 s/p BMS to LCX; c. 06/2015 Cath: LM 50ost, LAD 100ost/p, 610m, D2 nl, LCX  patent stent, RCA 80p (small), LIMA->LAD nl, VG->D2 nl-->Med Rx; d. 05/2020 MV: fixed apical ant/apical defect. No signif ischemia; e. 04/2021 MV: EF 52%, no ischemia.   Carotid arterial disease ([facility]   a. 08/2016 Carotid U/S: <39% bilat.   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   CKD (chronic kidney disease), stage III ([facility]   Community acquired pneumonia of right lower lobe of lung 06/05/2017   COVID-19 virus infection 05/30/2019   Covid PNA with hospitalization 05/2019   DUPUYTREN'S CONTRACTURE, 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   DVT, HX OF 1998   Emphysema of lung ([facility]   GERD 04/30/2007   Headache    hx migraines   History of hiatal hernia    History of shingles    HYPERLIPIDEMIA 04/26/2007   HYPERTENSION 04/30/2007   INGUINAL HERNIA, RIGHT 05/26/2010   Laceration of skin of left hand 03/08/2018   Lumbar disc disease with radiculopathy    neuropathy in feet   Myocardial infarction ([facility]   1989, 2002   OSA (obstructive sleep apnea)    a. did not tolerate CPAP.   Osteoarthritis    PAD (peripheral  artery disease) ([facility]   a. 07/2017 LE duplex: RSFA 75-9926mSFA 75-39m43m-74d; c. 08/2018 Periph Angio: No signif AoIliac dzs. Mod-sev Ca2+ RSFA w/ diff dzs throughout- 3 vessel runoff. Borderline signif LSFA dzs w/ mod-sev Ca2+ vessels and 3 vessel runoff below the knee-->Med rx.   Pneumonia 07/2014   [facility] hospitalization   Pneumonia due to COVID-19 virus 06/02/2019   Pre-diabetes    Prostatitis, chronic    PSA, INCREASED 07/09/2008   Spinal stenosis of lumbar region    Past Surgical History:  Procedure Laterality Date   ABDOMINAL AORTOGRAM W/LOWER EXTREMITY [name] 09/12/2018   Procedure: ABDOMINAL AORTOGRAM W/LOWER EXTREMITY;  Surgeon: AridWellington Hampshire;  [facility];  Service: Cardiovascular;  Laterality: [name];   APPENDECTOMY     rupture   BACK SURGERY     1984 and then another one [facility]   BLADDER INSTILLATION [name] 10/19/2021   Procedure: BLADDER INSTILLATION OF GEMCITABINE;  Surgeon: StoiAbbie Sons;  Location: [facility];  Service: Urology;  Laterality: [name];   CARDIAC CATHETERIZATION [name] 07/07/2015   Procedure: Left Heart Cath and Cors/Grafts Angiography;  Surgeon: TimoMinna Merritts;  [facility];  Service: Cardiovascular;  Laterality: [name];   CATARACT EXTRACTION, BILATERAL  2009   CERVICAL SPINE SURGERY  12/2016   cervical stenosis (  [place])   COLONOSCOPY  03/2010   HP polyp, diverticulosis, rpt 10 yrs ([name])   CORONARY ANGIOPLASTY  11/30/2000   [facility]   CORONARY ARTERY BYPASS GRAFT  1990   3 vessel    CYSTOSCOPY  12/23/2010   [facility]   EYE SURGERY     cataract   JOINT REPLACEMENT     KNEE ARTHROSCOPY Right    x2   LAMINOTOMY  1986   L5/S1 lumbar laminotomy for two ruptured discs/fusion   LUMBAR LAMINECTOMY/DECOMPRESSION MICRODISCECTOMY [name] 07/13/2016   Procedure: LUMBAR TWO-THREE, LUMBAR THREE-FOUR, LUMBAR FOUR-FIVE LAMINECTOMY AND FORAMINOTOMY;  Surgeon: [name], MD;  Location: [place];  Service: Neurosurgery;  Laterality:  [name];  LAMINECTOMY AND FORAMINOTOMY L2-L3, L3-L4,L4-L5   LUMBAR SPINE SURGERY  06/2020   Bilateral redo laminectomy/laminotomy/foraminotomies/medial facetectomy to decompress the bilateral L4 and L5 nerve roots [name])   TOTAL HIP ARTHROPLASTY Bilateral [id number]   TOTAL KNEE ARTHROPLASTY Right 03/06/2017   Procedure: RIGHT TOTAL KNEE ARTHROPLASTY;  Surgeon: Gaynelle Arabian, MD;  Location: [facility];  Service: Orthopedics;  Laterality: Right;   TRANSURETHRAL RESECTION OF BLADDER TUMOR [name] 10/19/2021   Procedure: TRANSURETHRAL RESECTION OF BLADDER TUMOR (TURBT);  Surgeon: Abbie Sons, MD;  Location: [facility];  Service: Urology;  Laterality: [name];   Patient Active Problem List   Diagnosis Date Noted   Right low back pain 12/29/2021   COPD exacerbation ([place]) 11/12/2021   Bladder cancer ([place]) 11/12/2021   Acute-on-chronic kidney injury ([place]) 07/09/2021   OSA (obstructive sleep apnea)    Acute on chronic diastolic CHF (congestive heart failure) ([place])    Right leg pain 06/22/2021   Nocturnal hypoxia 06/11/2021   CKD (chronic kidney disease) stage 3, GFR 30-59 ml/min ([place]) 02/24/2021   Peripheral neuropathy 02/23/2021   Spondylolisthesis of lumbar region 07/07/2020   COPD (chronic obstructive pulmonary disease) ([place]) 01/14/2020   Coccydynia 09/20/2019   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[place]) 07/08/2019   Anxiety 06/04/2019   Chronic right shoulder pain 09/10/2018   Cervical stenosis of spinal canal 06/05/2017   Pedal edema 06/05/2017   Health maintenance examination 12/06/2016   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/13/2016   Overweight (BMI 25.0-29.9) 07/04/2016   Low vitamin B12 level 01/01/2016   Exertional dyspnea 07/07/2015   Medicare annual wellness visit, subsequent 07/03/2015   Advanced care planning/counseling discussion 07/03/2015   Acute recurrent frontal sinusitis 05/05/2015   Type 2 diabetes mellitus with other specified complication ([place]) 15/17/6160    Pleuritic chest pain 05/04/2015   Ex-smoker 05/04/2015   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   Spermatocele 04/02/2013   Syncopal vertigo 11/04/2010   Lumbar disc disease with radiculopathy 11/04/2010   CAD, ARTERY BYPASS GRAFT 08/11/2009   PAD (peripheral artery disease) ([place]) 08/11/2009   DUPUYTREN'S CONTRACTURE, 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   Carotid stenosis 07/09/2008   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 He was lifting something heavy and heard and felt a large pop. He did an MRI done that "showed a tear in the hamstring" but more importantly that he had bladder cancer. He went through 6 weeks of treatment for this, and  is just now able to address the proximal hamstring pain. Pain has gotten better since initial injury but is still present. Pain is a dull ache that is constant. Worst pain 4/10; best 0/10. Pain is worsened by sitting, bending forward to pick things up and getting things out of his garden. Pt is retired, but enjoys gardening and wood working. Pt reports 2 falls in past 6 months, one time tripping over a rock, tripped getting off his lawnmower but caught himself. Has some lingering SOB from having COVID in 2020, has RW and SPC at home because of this but does not use. Walking more limited by SOB symptoms, unlimited with hamstring. Standing is unlimited. Pt denies N/V, B&B changes, unexplained weight fluctuation, saddle paresthesia, fever, night sweats, or unrelenting night pain at this time.    PRECAUTIONS: none  SUBJECTIVE: Pt reports no pain this morning. Reports he thinks his prednisone taper is  helping this. Is completing his HEP without question or concern.   PAIN:  Are you having pain? No   OBJECTIVE: (objective measures completed at initial evaluation unless otherwise dated)  DIAGNOSTIC FINDINGS:  MRI hamstring at [facility] Jan 2022 with partial tear of prox hamstring (per pt). Several other imaging in chart   PATIENT SURVEYS:  FOTO 51 with goal of 60   SCREENING FOR RED FLAGS: Bowel or bladder incontinence: No Spinal tumors: No Cauda equina syndrome: No Compression fracture: No Abdominal aneurysm: No   COGNITION:           Overall cognitive status: Within functional limits for tasks assessed                          SENSATION: WFL   MUSCLE LENGTH: Hamstrings: Right 145d deg; Left 120deg     POSTURE: decreased full hip ext bilat with decreased lumbar lordosis; lower crossed. Forward head, rounded shoulders, increased thoracic kyphosis   PALPATION: TTP with concordant pain to palpation of proximal hamstring insertion. Latent trigger points to mid glute max fiers over piriformis. No TTP at mid hamstring group muscle belly, distal insertion, of glute musculature   LUMBAR ROM:    Active  A/PROM  eval  Flexion WNL in sitting; in standing limited by hamstrings  Extension 50% limited  Right lateral flexion    Left lateral flexion    Right rotation 50% limited  Left rotation 50% limited   (Blank rows = not tested)   LOWER EXTREMITY ROM:      Active/PROM Right eval Left eval  Hip flexion 100/100 100/100  Hip extension 0/10 0/10  Hip abduction Wnl wnl  Hip adduction wnl WNL  Hip internal rotation 24d both 20d both  Hip external rotation WNL WNL  Knee flexion 128 Both 130 Both  Knee extension WNL WNL   Ankle dorsiflexion 10d/18d 10d/18d  Ankle plantarflexion      Ankle inversion      Ankle eversion       (Blank rows = not tested)   Knee flex in elys position L: 95d R 90d    LOWER EXTREMITY MMT:     MMT Right eval Left eval  Hip flexion 5  5  Hip extension 3- 2+  Hip abduction 4- 4-  Hip adduction      Hip internal rotation 24 20  Hip external rotation WNL WNL  Knee flexion 5 4 with concordant pain  Knee extension 5 5  Ankle dorsiflexion 5 5  Ankle plantarflexion  Ankle inversion      Ankle eversion       (Blank rows = not tested)   1RM Hamstring curl R 45# L 25#   LUMBAR SPECIAL TESTS:  FABER test: Negative and   FADIR TEST: NEGATIVE BUT WITH "STRETCH SENSATION   FUNCTIONAL TESTS:  5 times sit to stand: 19SEC 10 meter walk test: self selected 0.59ms fastest 1.037m SLS R: 14sec L: 21sec Squat only to parallel before heel raise compensation is needed   GAIT:              5062fith no AD used decreased speed, decreased terminal hip ext bilat presenting with decreased step length bilat, decreased LLE stance time with shortened RLE step > LLE  TODAY'S TREATMENT  Todays Intervention  Start of session: 153/66 pulse 87 Nustep seat 10 BLEs L3 for gentle strengthening Good morning with patient reporting dizziness at 4th rep. Seated BP and pulse taken:  BP following 150/62  Hip hinge with dowel held at occiput and sacrum x12 cuing for maintaining these two points of contact with decent carry over; without dowel x12 With 5# DB bilat Ues 2x10 BP following 426/62  OMEGA hamstring curl LLE only eccentric focus 15# x10; 20# x10    PATIENT EDUCATION:  Education details: HEP review. Education on need to reach out to PCP about BP concerns with pt understanding Person educated: Patient Education method: Explanation, Demonstration, Tactile cues, and Handouts Education comprehension: verbalized understanding, returned demonstration, and verbal cues required     HOME EXERCISE PROGRAM: Gentle hamstring stretch   ASSESSMENT:   CLINICAL IMPRESSION: PT continued therex progression  for eccentric hamstring strengthening and lengthening. Patient is able to comply with all cuing for proper technique of therex with good  effort throughout session. Vitals monitored throughout session. PT will continue progression as able.      OBJECTIVE IMPAIRMENTS Abnormal gait, decreased activity tolerance, decreased balance, decreased cognition, decreased coordination, decreased endurance, decreased knowledge of use of DME, decreased mobility, difficulty walking, decreased ROM, decreased strength, decreased safety awareness, increased fascial restrictions, increased muscle spasms, impaired flexibility, improper body mechanics, and postural dysfunction.    ACTIVITY LIMITATIONS carrying, lifting, bending, sitting, standing, squatting, and stairs   PARTICIPATION LIMITATIONS: meal prep, cleaning, laundry, community activity, and yard work   PERSONAL FACTORS Age, Education, Past/current experiences, Time since onset of injury/illness/exacerbation, and 1 comorbidity: N  are also affecting patient's functional outcome.    REHAB POTENTIAL: Good   CLINICAL DECISION MAKING: Evolving/moderate complexity   EVALUATION COMPLEXITY: Moderate     GOALS: Goals reviewed with patient? Yes   SHORT TERM GOALS: Target date: 04/15/2022   Pt will be independent with HEP in order to improve strength and balance in order to decrease fall risk and improve function at home and work.   Baseline: Goal status: INITIAL     LONG TERM GOALS: Target date: 05/13/2022   Pt will demonstrate 1RM hamstring curl knee flex of LLE of at least 40.5# in order to demonstrate 90% of RLE, PLOF strength to complete heavy outdoor ADLs Baseline: 03/16/22 25# Goal status: INITIAL   2.  Pt will decrease worst pain as reported on NPRS by at least 3 points in order to demonstrate clinically significant reduction in pain.   Baseline:  Goal status: INITIAL   3.  Patient will increase FOTO score to 60 to demonstrate predicted increase in functional mobility to complete ADLs   Baseline: 51 Goal status: INITIAL   4.  Pt will  increase 10MWT to  at least 1.20.13 m/s in  order to demonstrate clinically significant improvement in uninhibited community ambulation.    Baseline: self selected 0.34ms; fastest 1.056m Goal status: INITIAL   5. Pt will decrease 5TSTS by at least 3 seconds in order to demonstrate clinically significant improvement in LE strength   Baseline: 19sec Goal status: INITIAL                     PLAN: PT FREQUENCY: 1-2x/week   PT DURATION: 8 weeks   PLANNED INTERVENTIONS: Therapeutic exercises, Therapeutic activity, Neuromuscular re-education, Balance training, Gait training, Patient/Family education, Self Care, Joint mobilization, Joint manipulation, Dry Needling, Cryotherapy, and Moist heat.   PLAN FOR NEXT SESSION: DGI, eccentric hamstring strengthening   ChDurwin RegesPT

## 2022-04-01 ENCOUNTER — Ambulatory Visit: Payer: PPO | Admitting: Urology

## 2022-04-03 ENCOUNTER — Encounter: Payer: Self-pay | Admitting: Urology

## 2022-04-05 ENCOUNTER — Encounter: Payer: Self-pay | Admitting: Physical Therapy

## 2022-04-05 ENCOUNTER — Ambulatory Visit: Payer: PPO | Admitting: Physical Therapy

## 2022-04-05 ENCOUNTER — Telehealth: Payer: Self-pay | Admitting: Cardiovascular Disease

## 2022-04-05 DIAGNOSIS — M25552 Pain in left hip: Secondary | ICD-10-CM

## 2022-04-05 DIAGNOSIS — M4722 Other spondylosis with radiculopathy, cervical region: Secondary | ICD-10-CM | POA: Diagnosis not present

## 2022-04-05 DIAGNOSIS — M542 Cervicalgia: Secondary | ICD-10-CM | POA: Diagnosis not present

## 2022-04-05 DIAGNOSIS — M25551 Pain in right hip: Secondary | ICD-10-CM

## 2022-04-05 NOTE — Telephone Encounter (Signed)
Pt c/o BP issue: STAT if pt c/o blurred vision, one-sided weakness or slurred speech  1. What are your last 5 BP readings?  Patient states readings has been in the 40's  2. Are you having any other symptoms (ex. Dizziness, headache, blurred vision, passed out)?   No  3. What is your BP issue?   Patient called stating he went to PT but they did not do his regimen as his blood pressure was low.  Patient would like a callback to discuss this low blood pressure issue.

## 2022-04-05 NOTE — Therapy (Addendum)
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 06/2019 Echo (in setting of COVID): EF 35-40%, mild LVH, g1 DD, glob HK; b. 09/2019 Echo: EF 50-55%, Gr1 DD; c. 03/2021 Echo: EF 50-55%, no rwma, mild LVH, GrI DD, nl RV fxn. RVSP 44.14mHg. Mild-mod dil LA. Triv MR. Mild-mod Ao sclerosis.   AAA (abdominal aortic aneurysm) (HCC)    Actinic keratosis    Anginal pain ([facility]    BENIGN PROSTATIC HYPERTROPHY, WITH URINARY OBSTRUCTION 09/06/2007   CAD s/p CABG    a. 1990 s/p MI-->CABG x 2; b. 2002 s/p BMS to LCX; c. 06/2015 Cath: LM 50ost, LAD 100ost/p, 625m, D2 nl, LCX  patent stent, RCA 80p (small), LIMA->LAD nl, VG->D2 nl-->Med Rx; d. 05/2020 MV: fixed apical ant/apical defect. No signif ischemia; e. 04/2021 MV: EF 52%, no ischemia.   Carotid arterial disease ([facility]   a. 08/2016 Carotid U/S: <39% bilat.   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   CKD (chronic kidney disease), stage III ([facility]   Community acquired pneumonia of right lower lobe of lung 06/05/2017   COVID-19 virus infection 05/30/2019   Covid PNA with hospitalization 05/2019   DUPUYTREN'S CONTRACTURE, 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   DVT, HX OF 1998   Emphysema of lung ([facility]   GERD 04/30/2007   Headache    hx migraines   History of hiatal hernia    History of shingles    HYPERLIPIDEMIA 04/26/2007   HYPERTENSION 04/30/2007   INGUINAL HERNIA, RIGHT 05/26/2010   Laceration of skin of left hand 03/08/2018   Lumbar disc disease with radiculopathy    neuropathy in feet   Myocardial infarction ([facility]   1989, 2002   OSA (obstructive sleep apnea)    a. did not tolerate CPAP.   Osteoarthritis    PAD (peripheral artery  disease) ([facility]   a. 07/2017 LE duplex: RSFA 75-9957mSFA 75-51m67m-74d; c. 08/2018 Periph Angio: No signif AoIliac dzs. Mod-sev Ca2+ RSFA w/ diff dzs throughout- 3 vessel runoff. Borderline signif LSFA dzs w/ mod-sev Ca2+ vessels and 3 vessel runoff below the knee-->Med rx.   Pneumonia 07/2014   [facility] hospitalization   Pneumonia due to COVID-19 virus 06/02/2019   Pre-diabetes    Prostatitis, chronic    PSA, INCREASED 07/09/2008   Spinal stenosis of lumbar region    Past Surgical History:  Procedure Laterality Date   ABDOMINAL AORTOGRAM W/LOWER EXTREMITY [name] 09/12/2018   Procedure: ABDOMINAL AORTOGRAM W/LOWER EXTREMITY;  Surgeon: AridWellington Hampshire;  [facility];  Service: Cardiovascular;  Laterality: [name];   APPENDECTOMY     rupture   BACK SURGERY     1984 and then another one [facility]   BLADDER INSTILLATION [name] 10/19/2021   Procedure: BLADDER INSTILLATION OF GEMCITABINE;  Surgeon: StoiAbbie Sons;  Location: [facility];  Service: Urology;  Laterality: [name];   CARDIAC CATHETERIZATION [name] 07/07/2015   Procedure: Left Heart Cath and Cors/Grafts Angiography;  Surgeon: TimoMinna Merritts;  [facility];  Service: Cardiovascular;  Laterality: [name];   CATARACT EXTRACTION, BILATERAL  2009   CERVICAL SPINE SURGERY  12/2016   cervical stenosis (  [place])   COLONOSCOPY  03/2010   HP polyp, diverticulosis, rpt 10 yrs ([name])   CORONARY ANGIOPLASTY  11/30/2000   [facility]   CORONARY ARTERY BYPASS GRAFT  1990   3 vessel    CYSTOSCOPY  12/23/2010   [facility]   EYE SURGERY     cataract   JOINT REPLACEMENT     KNEE ARTHROSCOPY Right    x2   LAMINOTOMY  1986   L5/S1 lumbar laminotomy for two ruptured discs/fusion   LUMBAR LAMINECTOMY/DECOMPRESSION MICRODISCECTOMY [name] 07/13/2016   Procedure: LUMBAR TWO-THREE, LUMBAR THREE-FOUR, LUMBAR FOUR-FIVE LAMINECTOMY AND FORAMINOTOMY;  Surgeon: [name], MD;  Location: [place];  Service: Neurosurgery;  Laterality: [name];   LAMINECTOMY AND FORAMINOTOMY L2-L3, L3-L4,L4-L5   LUMBAR SPINE SURGERY  06/2020   Bilateral redo laminectomy/laminotomy/foraminotomies/medial facetectomy to decompress the bilateral L4 and L5 nerve roots [name])   TOTAL HIP ARTHROPLASTY Bilateral [id number]   TOTAL KNEE ARTHROPLASTY Right 03/06/2017   Procedure: RIGHT TOTAL KNEE ARTHROPLASTY;  Surgeon: Gaynelle Arabian, MD;  Location: [facility];  Service: Orthopedics;  Laterality: Right;   TRANSURETHRAL RESECTION OF BLADDER TUMOR [name] 10/19/2021   Procedure: TRANSURETHRAL RESECTION OF BLADDER TUMOR (TURBT);  Surgeon: Abbie Sons, MD;  Location: [facility];  Service: Urology;  Laterality: [name];   Patient Active Problem List   Diagnosis Date Noted   Right low back pain 12/29/2021   COPD exacerbation ([place]) 11/12/2021   Bladder cancer ([place]) 11/12/2021   Acute-on-chronic kidney injury ([place]) 07/09/2021   OSA (obstructive sleep apnea)    Acute on chronic diastolic CHF (congestive heart failure) ([place])    Right leg pain 06/22/2021   Nocturnal hypoxia 06/11/2021   CKD (chronic kidney disease) stage 3, GFR 30-59 ml/min ([place]) 02/24/2021   Peripheral neuropathy 02/23/2021   Spondylolisthesis of lumbar region 07/07/2020   COPD (chronic obstructive pulmonary disease) ([place]) 01/14/2020   Coccydynia 09/20/2019   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[place]) 07/08/2019   Anxiety 06/04/2019   Chronic right shoulder pain 09/10/2018   Cervical stenosis of spinal canal 06/05/2017   Pedal edema 06/05/2017   Health maintenance examination 12/06/2016   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/13/2016   Overweight (BMI 25.0-29.9) 07/04/2016   Low vitamin B12 level 01/01/2016   Exertional dyspnea 07/07/2015   Medicare annual wellness visit, subsequent 07/03/2015   Advanced care planning/counseling discussion 07/03/2015   Acute recurrent frontal sinusitis 05/05/2015   Type 2 diabetes mellitus with other specified complication ([place]) 56/43/3295   Pleuritic  chest pain 05/04/2015   Ex-smoker 05/04/2015   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   Spermatocele 04/02/2013   Syncopal vertigo 11/04/2010   Lumbar disc disease with radiculopathy 11/04/2010   CAD, ARTERY BYPASS GRAFT 08/11/2009   PAD (peripheral artery disease) ([place]) 08/11/2009   DUPUYTREN'S CONTRACTURE, 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 He was lifting something heavy and heard and felt a large pop. He did an MRI done that "showed a tear in the hamstring" but more importantly that he had bladder cancer. He went through 6 weeks  of treatment for this, and is just now able to address the proximal hamstring pain. Pain has gotten better since initial injury but is still present. Pain is a dull ache that is constant. Worst pain 4/10; best 0/10. Pain is worsened by sitting, bending forward to pick things up and getting things out of his garden. Pt is retired, but enjoys gardening and wood working. Pt reports 2 falls in past 6 months, one time tripping over a rock, tripped getting off his lawnmower but caught himself. Has some lingering SOB from having COVID in 2020, has RW and SPC at home because of this but does not use. Walking more limited by SOB symptoms, unlimited with hamstring. Standing is unlimited. Pt denies N/V, B&B changes, unexplained weight fluctuation, saddle paresthesia, fever, night sweats, or unrelenting night pain at this time.     PRECAUTIONS: none   SUBJECTIVE: Pt reports he has not taken  BP medication this am. Reports he has been at the beach and not taking his BP, but that Wednesday he had symptoms of low BP, with dizziness, fatigue, etc. Compliance with HEP. No hamstring pain this am.      PAIN:  Are you having pain? No     OBJECTIVE: (objective measures completed at initial evaluation unless otherwise dated)   DIAGNOSTIC FINDINGS:  MRI hamstring at [facility] Jan 2022 with partial tear of prox hamstring (per pt). Several other imaging in chart   PATIENT SURVEYS:  FOTO 51 with goal of 60   SCREENING FOR RED FLAGS: Bowel or bladder incontinence: No Spinal tumors: No Cauda equina syndrome: No Compression fracture: No Abdominal aneurysm: No   COGNITION:           Overall cognitive status: Within functional limits for tasks assessed                          SENSATION: WFL   MUSCLE LENGTH: Hamstrings: Right 120d deg; Left 145deg     POSTURE: decreased full hip ext bilat with decreased lumbar lordosis; lower crossed. Forward head, rounded shoulders, increased thoracic kyphosis   PALPATION: TTP with concordant pain to palpation of proximal hamstring insertion. Latent trigger points to mid glute max fiers over piriformis. No TTP at mid hamstring group muscle belly, distal insertion, of glute musculature   LUMBAR ROM:    Active  A/PROM  eval  Flexion WNL in sitting; in standing limited by hamstrings  Extension 50% limited  Right lateral flexion    Left lateral flexion    Right rotation 50% limited  Left rotation 50% limited   (Blank rows = not tested)   LOWER EXTREMITY ROM:      Active/PROM Right eval Left eval  Hip flexion 100/100 100/100  Hip extension 0/10 0/10  Hip abduction Wnl wnl  Hip adduction wnl WNL  Hip internal rotation 20d both 24d both  Hip external rotation WNL WNL  Knee flexion 130 Both 128 Both  Knee extension WNL WNL   Ankle dorsiflexion 10d/18d 10d/18d  Ankle plantarflexion      Ankle inversion      Ankle eversion        (Blank rows = not tested)   Knee flex in elys position L: 95d R 90d   LOWER EXTREMITY MMT:     MMT Right eval Left eval  Hip flexion 5 5  Hip extension 2+ 3-  Hip abduction 4- 4-  Hip adduction      Hip internal rotation  20 24  Hip external rotation WNL WNL  Knee flexion 4 with concordant pain 5  Knee extension 5 5  Ankle dorsiflexion 5 5  Ankle plantarflexion      Ankle inversion      Ankle eversion       (Blank rows = not tested)   1RM Hamstring curl R 45# L 25#   LUMBAR SPECIAL TESTS:  FABER test: Negative and   FADIR TEST: NEGATIVE BUT WITH "STRETCH SENSATION   FUNCTIONAL TESTS:  5 times sit to stand: 19SEC 10 meter walk test: self selected 0.24ms fastest 1.075m SLS R: 14sec L: 21sec Squat only to parallel before heel raise compensation is needed   GAIT:              5026fith no AD used decreased speed, decreased terminal hip ext bilat presenting with decreased step length bilat, decreased RLE stance time with shortened LLE step > RLE     TODAY'S TREATMENT  Todays Intervention  Start of session: 132/54 pulse 73 Nustep seat 10 BLEs L3 for gentle strengthening BP following 79/60 on machine, retaken due to suspicion of machine error 132/48 Manual 130/46    PATIENT EDUCATION:  Education details: HEP review. Education on need to reach out to PCP about BP concerns with pt understanding Person educated: Patient Education method: Explanation, Demonstration, Tactile cues, and Handouts Education comprehension: verbalized understanding, returned demonstration, and verbal cues required     HOME EXERCISE PROGRAM: Gentle hamstring stretch   ASSESSMENT:   CLINICAL IMPRESSION: PT session inhibited by patient blood pressure readings. Patient with systolic BP in 50s37T start of session, lowering to 46 with exercise. Patient denies dizziness at this time, but reports he has had this over the past week, and has "felt awful". Session ceased, patient offered EMS  (declined), and email communication sent to cardiologist per pt request. PT will continue progression as able.      OBJECTIVE IMPAIRMENTS Abnormal gait, decreased activity tolerance, decreased balance, decreased cognition, decreased coordination, decreased endurance, decreased knowledge of use of DME, decreased mobility, difficulty walking, decreased ROM, decreased strength, decreased safety awareness, increased fascial restrictions, increased muscle spasms, impaired flexibility, improper body mechanics, and postural dysfunction.    ACTIVITY LIMITATIONS carrying, lifting, bending, sitting, standing, squatting, and stairs   PARTICIPATION LIMITATIONS: meal prep, cleaning, laundry, community activity, and yard work   PERSONAL FACTORS Age, Education, Past/current experiences, Time since onset of injury/illness/exacerbation, and 1 comorbidity: N  are also affecting patient's functional outcome.    REHAB POTENTIAL: Good   CLINICAL DECISION MAKING: Evolving/moderate complexity   EVALUATION COMPLEXITY: Moderate     GOALS: Goals reviewed with patient? Yes   SHORT TERM GOALS: Target date: 04/15/2022   Pt will be independent with HEP in order to improve strength and balance in order to decrease fall risk and improve function at home and work.   Baseline: Goal status: INITIAL     LONG TERM GOALS: Target date: 05/13/2022   Pt will demonstrate 1RM hamstring curl knee flex of RLE of at least 40.5# in order to demonstrate 90% of LLE, PLOF strength to complete heavy outdoor ADLs Baseline: 03/16/22 25# Goal status: INITIAL   2.  Pt will decrease worst pain as reported on NPRS by at least 3 points in order to demonstrate clinically significant reduction in pain.   Baseline:  Goal status: INITIAL   3.  Patient will increase FOTO score to 60 to demonstrate predicted increase in functional mobility to complete ADLs  Baseline: 51 Goal status: INITIAL   4.  Pt will increase 10MWT to  at least  1.20.13 m/s in order to demonstrate clinically significant improvement in uninhibited community ambulation.    Baseline: self selected 0.15ms; fastest 1.027m Goal status: INITIAL   5. Pt will decrease 5TSTS by at least 3 seconds in order to demonstrate clinically significant improvement in LE strength   Baseline: 19sec Goal status: INITIAL                     PLAN: PT FREQUENCY: 1-2x/week   PT DURATION: 8 weeks   PLANNED INTERVENTIONS: Therapeutic exercises, Therapeutic activity, Neuromuscular re-education, Balance training, Gait training, Patient/Family education, Self Care, Joint mobilization, Joint manipulation, Dry Needling, Cryotherapy, and Moist heat.   PLAN FOR NEXT SESSION: DGI, eccentric hamstring strengthening              ChDurwin RegesPT ChDurwin RegesPT 04/08/2022, 10:06 AM

## 2022-04-05 NOTE — Telephone Encounter (Signed)
Called pt in regards to hypotension.  Reports was at PT OV 1st BP 130/50's, allowed to exercise some and rechecked BP 130/48.  Was told can not participate with BP this low.    Feels weak and has been for about a week.  On Wednesday  03/30/22 was so weak had to lay around all day.  Has not taken losartan today and is afraid to take doesn't know how low BP will go.  Also reports is having difficulty with neck may have a pinched nerve in neck.   Will route to Dr. Rockey Situ to advise.

## 2022-04-06 NOTE — Telephone Encounter (Signed)
[name], MD  Sent: Wed April 06, 2022  1:15 PM  To: [facility] Triage          Message  Diastolic pressure little bit low but I am not concerned  Typically we look at the average of top and bottom, mean arterial pressure is not that bad, over 70  If he is concerned or feels he is having symptoms from low blood pressure, could hold the isosorbide  On days with PT, hold the losartan and Lasix until done with PT, take it later in the day  As we get older diastolic pressure can decrease, commonly seen    Thx  TGollan

## 2022-04-06 NOTE — Telephone Encounter (Signed)
I spoke with the patient and advised him of Dr. Donivan Scull response as stated below.  Per the patient, his PT will not do therapy for him if his DBP is < 60 mmHg. He confirms he has been holding lasix & losartan before therapy, but numbers are still low.  He takes imdur 15 mg once daily at night. He denies chest pain, but does have issues with his breathing due to COPD/ Covid Lung.  He occasionally feels "swimmy headed."  He is trying to get his PT in due to a torn hamstring.   I have advised the patient that the other option at this point would be to hold imdur the night before his PT. Hold lasix & losartan the day of PT and take these medications after therapy is completed.   The patient voices understanding and is agreeable.  He advised his home BP this morning (before meds) was 125/64. He did check his BP while on the phone with me and he was 128/64.  His next PT is due on Friday. He will call us back if his DBP continues to run low and he can't get his PT done.  He does confirm he is currently taking lasix less than every third day due to a stable weight.   The patient was appreciative of the call back.

## 2022-04-08 ENCOUNTER — Encounter: Payer: Self-pay | Admitting: Physical Therapy

## 2022-04-08 ENCOUNTER — Ambulatory Visit: Payer: PPO | Admitting: Physical Therapy

## 2022-04-08 DIAGNOSIS — M25552 Pain in left hip: Secondary | ICD-10-CM | POA: Diagnosis not present

## 2022-04-08 DIAGNOSIS — M25551 Pain in right hip: Secondary | ICD-10-CM

## 2022-04-08 NOTE — Addendum Note (Signed)
Addended by: Kelton Pillar on: 04/08/2022 09:53 AM   Modules accepted: Orders

## 2022-04-08 NOTE — Therapy (Signed)
OUTPATIENT PHYSICAL THERAPY SCREEN   Patient Name: [name] MRN: [id number] DOB:1940/10/18, 80 y.o., male Today's Date: 04/08/2022  PCP: Ria Bush MD REFERRING PROVIDER: Gerrit Halls PA  END OF SESSION:   PT End of Session - 04/08/22 0924     Visit Number 4    Number of Visits 17    Date for PT Re-Evaluation 05/18/22    Authorization Type 7/10 Medicare    Authorization - Visit Number 4    Authorization - Number of Visits 10    Progress Note Due on Visit 10    PT Start Time 0919    PT Stop Time 0957    PT Time Calculation (min) 38 min    Activity Tolerance Treatment limited secondary to medical complications (Comment)    Behavior During Therapy [facility] for tasks assessed/performed                Past Medical History:  Diagnosis Date   (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 06/2019 Echo (in setting of COVID): EF 35-40%, mild LVH, g1 DD, glob HK; b. 09/2019 Echo: EF 50-55%, Gr1 DD; c. 03/2021 Echo: EF 50-55%, no rwma, mild LVH, GrI DD, nl RV fxn. RVSP 44.24mHg. Mild-mod dil LA. Triv MR. Mild-mod Ao sclerosis.   AAA (abdominal aortic aneurysm) (HCC)    Actinic keratosis    Anginal pain ([place]    BENIGN PROSTATIC HYPERTROPHY, WITH URINARY OBSTRUCTION 09/06/2007   CAD s/p CABG    a. 1990 s/p MI-->CABG x 2; b. 2002 s/p BMS to LCX; c. 06/2015 Cath: LM 50ost, LAD 100ost/p, 676m, D2 nl, LCX  patent stent, RCA 80p (small), LIMA->LAD nl, VG->D2 nl-->Med Rx; d. 05/2020 MV: fixed apical ant/apical defect. No signif ischemia; e. 04/2021 MV: EF 52%, no ischemia.   Carotid arterial disease ([place]   a. 08/2016 Carotid U/S: <39% bilat.   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   CKD (chronic kidney disease), stage III ([place]   Community acquired pneumonia of right lower lobe of lung 06/05/2017   COVID-19 virus infection 05/30/2019   Covid PNA with hospitalization 05/2019   DUPUYTREN'S CONTRACTURE, RIGHT  10/29/2008   DVT, HX OF 1998   Emphysema of lung ([place]   GERD 04/30/2007   Headache    hx migraines   History of hiatal hernia    History of shingles    HYPERLIPIDEMIA 04/26/2007   HYPERTENSION 04/30/2007   INGUINAL HERNIA, RIGHT 05/26/2010   Laceration of skin of left hand 03/08/2018   Lumbar disc disease with radiculopathy    neuropathy in feet   Myocardial infarction ([place]   1989, 2002   OSA (obstructive sleep apnea)    a. did not tolerate CPAP.   Osteoarthritis    PAD (peripheral artery disease) ([place]   a. 07/2017 LE duplex: RSFA 75-9929mSFA 75-33m68m-74d; c. 08/2018 Periph Angio: No signif AoIliac dzs. Mod-sev Ca2+ RSFA w/ diff dzs throughout- 3 vessel runoff. Borderline signif LSFA dzs w/ mod-sev Ca2+ vessels and 3 vessel runoff below the knee-->Med rx.   Pneumonia 07/2014   [facility] hospitalization   Pneumonia due to COVID-19 virus 06/02/2019   Pre-diabetes    Prostatitis, chronic    PSA, INCREASED 07/09/2008   Spinal stenosis of lumbar region    Past Surgical History:  Procedure Laterality Date   ABDOMINAL AORTOGRAM W/LOWER EXTREMITY [name] 09/12/2018   Procedure: ABDOMINAL AORTOGRAM W/LOWER EXTREMITY;  Surgeon: AridFletcher Anon  Mertie Clause, MD;  Location: [place] [facility];  Service: Cardiovascular;  Laterality: [name];   APPENDECTOMY     rupture   BACK SURGERY     1984 and then [facility]   BLADDER INSTILLATION [name] 10/19/2021   Procedure: BLADDER INSTILLATION OF GEMCITABINE;  Surgeon: Abbie Sons, MD;  Location: [facility];  Service: Urology;  Laterality: [name];   CARDIAC CATHETERIZATION [name] 07/07/2015   Procedure: Left Heart Cath and Cors/Grafts Angiography;  Surgeon: [name], MD;  Location: [place] [facility];  Service: Cardiovascular;  Laterality: [name];   CATARACT EXTRACTION, BILATERAL  2009   CERVICAL SPINE SURGERY  12/2016   cervical stenosis [name])   COLONOSCOPY  03/2010   HP polyp, diverticulosis, rpt 10 yrs ([name])   CORONARY ANGIOPLASTY   11/30/2000   [facility]X   CORONARY ARTERY BYPASS GRAFT  1990   3 vessel    CYSTOSCOPY  12/23/2010   [place]   EYE SURGERY     cataract   JOINT REPLACEMENT     KNEE ARTHROSCOPY Right    x2   LAMINOTOMY  1986   L5/S1 lumbar laminotomy for two ruptured discs/fusion   LUMBAR LAMINECTOMY/DECOMPRESSION MICRODISCECTOMY [name] 07/13/2016   Procedure: LUMBAR TWO-THREE, LUMBAR THREE-FOUR, LUMBAR FOUR-FIVE LAMINECTOMY AND FORAMINOTOMY;  Surgeon: [name], MD;  Location: [place];  Service: Neurosurgery;  Laterality: [name];  LAMINECTOMY AND FORAMINOTOMY L2-L3, L3-L4,L4-L5   LUMBAR SPINE SURGERY  06/2020   Bilateral redo laminectomy/laminotomy/foraminotomies/medial facetectomy to decompress the bilateral L4 and L5 nerve roots [name])   TOTAL HIP ARTHROPLASTY Bilateral 1517,6160   TOTAL KNEE ARTHROPLASTY Right 03/06/2017   Procedure: RIGHT TOTAL KNEE ARTHROPLASTY;  Surgeon: Gaynelle Arabian, MD;  Location: [facility];  Service: Orthopedics;  Laterality: Right;   TRANSURETHRAL RESECTION OF BLADDER TUMOR [name] 10/19/2021   Procedure: TRANSURETHRAL RESECTION OF BLADDER TUMOR (TURBT);  Surgeon: Abbie Sons, MD;  Location: [facility];  Service: Urology;  Laterality: [name];   Patient Active Problem List   Diagnosis Date Noted   Right low back pain 12/29/2021   COPD exacerbation ([place]) 11/12/2021   Bladder cancer ([place]) 11/12/2021   Acute-on-chronic kidney injury ([place]) 07/09/2021   OSA (obstructive sleep apnea)    Acute on chronic diastolic CHF (congestive heart failure) ([place])    Right leg pain 06/22/2021   Nocturnal hypoxia 06/11/2021   CKD (chronic kidney disease) stage 3, GFR 30-59 ml/min ([place]) 02/24/2021   Peripheral neuropathy 02/23/2021   Spondylolisthesis of lumbar region 07/07/2020   COPD (chronic obstructive pulmonary disease) ([place]) 01/14/2020   Coccydynia 09/20/2019   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[place]) 07/08/2019   Anxiety 06/04/2019   Chronic right shoulder pain 09/10/2018   Cervical stenosis of  spinal canal 06/05/2017   Pedal edema 06/05/2017   Health maintenance examination 12/06/2016   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/13/2016   Overweight (BMI 25.0-29.9) 07/04/2016   Low vitamin B12 level 01/01/2016   Exertional dyspnea 07/07/2015   Medicare annual wellness visit, subsequent 07/03/2015   Advanced care planning/counseling discussion 07/03/2015   Acute recurrent frontal sinusitis 05/05/2015   Type 2 diabetes mellitus with other specified complication ([place]) 73/71/0626   Pleuritic chest pain 05/04/2015   Ex-smoker 05/04/2015   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   Spermatocele 04/02/2013   Syncopal vertigo 11/04/2010   Lumbar disc disease with radiculopathy 11/04/2010   CAD, ARTERY BYPASS GRAFT 08/11/2009   PAD (peripheral artery disease) ([place]) 08/11/2009   DUPUYTREN'S CONTRACTURE, 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   Carotid stenosis 07/09/2008  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 He was lifting something heavy and heard and felt a large pop. He did an MRI done that "showed a tear in the hamstring" but more importantly that he had bladder cancer. He went through 6 weeks of treatment for this, and is just now able to address the proximal hamstring pain. Pain has gotten better since initial injury but is still present. Pain is a dull ache that is constant. Worst pain 4/10; best 0/10. Pain is worsened by sitting, bending forward to pick things up and getting things out of his garden. Pt is retired, but enjoys gardening and wood working. Pt reports 2 falls in past  6 months, one time tripping over a rock, tripped getting off his lawnmower but caught himself. Has some lingering SOB from having COVID in 2020, has RW and SPC at home because of this but does not use. Walking more limited by SOB symptoms, unlimited with hamstring. Standing is unlimited. Pt denies N/V, B&B changes, unexplained weight fluctuation, saddle paresthesia, fever, night sweats, or unrelenting night pain at this time.    PRECAUTIONS: none  SUBJECTIVE: Pt reports no dizziness symptoms since last visit; BP at home was 130s/70s this am. Is completing HEP. 4/10 hamstring pain this am. Concerned about neck pain, having an MRI for this next week.    PAIN:  Are you having pain? No   OBJECTIVE: (objective measures completed at initial evaluation unless otherwise dated)  DIAGNOSTIC FINDINGS:  MRI hamstring at [facility] Jan 2022 with partial tear of prox hamstring (per pt). Several other imaging in chart   PATIENT SURVEYS:  FOTO 51 with goal of 60   SCREENING FOR RED FLAGS: Bowel or bladder incontinence: No Spinal tumors: No Cauda equina syndrome: No Compression fracture: No Abdominal aneurysm: No   COGNITION:           Overall cognitive status: Within functional limits for tasks assessed                          SENSATION: WFL   MUSCLE LENGTH: Hamstrings: Right 145d deg; Left 120deg     POSTURE: decreased full hip ext bilat with decreased lumbar lordosis; lower crossed. Forward head, rounded shoulders, increased thoracic kyphosis   PALPATION: TTP with concordant pain to palpation of proximal hamstring insertion. Latent trigger points to mid glute max fiers over piriformis. No TTP at mid hamstring group muscle belly, distal insertion, of glute musculature   LUMBAR ROM:    Active  A/PROM  eval  Flexion WNL in sitting; in standing limited by hamstrings  Extension 50% limited  Right lateral flexion    Left lateral flexion    Right rotation 50% limited  Left rotation  50% limited   (Blank rows = not tested)   LOWER EXTREMITY ROM:      Active/PROM Right eval Left eval  Hip flexion 100/100 100/100  Hip extension 0/10 0/10  Hip abduction Wnl wnl  Hip adduction wnl WNL  Hip internal rotation 25d both 20d both  Hip external rotation WNL WNL  Knee flexion 130 Both 128 Both  Knee extension WNL WNL   Ankle dorsiflexion 10d/18d 10d/18d  Ankle plantarflexion      Ankle inversion      Ankle eversion       (Blank rows = not tested)   Knee flex in elys position L: 95d R 90d   LOWER EXTREMITY MMT:     MMT Right eval Left eval  Hip flexion 5 5  Hip extension 3- 2+  Hip abduction 4- 4-  Hip adduction      Hip internal rotation 24 20  Hip external rotation WNL WNL  Knee flexion 5 4 with concordant pain  Knee extension 5 5  Ankle dorsiflexion 5 5  Ankle plantarflexion      Ankle inversion      Ankle eversion       (Blank rows = not tested)   1RM Hamstring curl R 45# L 25#   LUMBAR SPECIAL TESTS:  FABER test: Negative and   FADIR TEST: NEGATIVE BUT WITH "STRETCH SENSATION   FUNCTIONAL TESTS:  5 times sit to stand: 19SEC 10 meter walk test: self selected 0.69ms fastest 1.027m SLS R: 14sec L: 21sec Squat only to parallel before heel raise compensation is needed   GAIT:              5033fith no AD used decreased speed, decreased terminal hip ext bilat presenting with decreased step length bilat, decreased RLE stance time with shortened LLE step > RLE     TODAY'S TREATMENT  Todays Intervention  Start of session: 154/64 pulse 86 Nustep seat 10 BLEs L3 for gentle strengthening BP following 143/55  Bridge x10; LLE only 2x 6 with good carry over of cuing with glute activation > lumbar ext BP following 151/62 Striaght leg deadlift with dowel for technique x12; with 10# DB in bilat hands 2x 10 with good carry over BP following 139/77 LLE eccentric hamstring curl 20# 2x 10     PATIENT EDUCATION:  Education details: HEP review. Education  on need to reach out to PCP about BP concerns with pt understanding Person educated: Patient Education method: Explanation, Demonstration, Tactile cues, and Handouts Education comprehension: verbalized understanding, returned demonstration, and verbal cues required     HOME EXERCISE PROGRAM: Gentle hamstring stretch   ASSESSMENT:   CLINICAL IMPRESSION: PT continued therex progression for increased eccentric hamstring strengthening and lengthening with success. Patient vitals monitored throughout session, within limits to be able to complete PT session. Pt is able to comply with all cuing for proper technique of therex with good effort throughout session; no increased pain. PT will continue progression as able.      OBJECTIVE IMPAIRMENTS Abnormal gait, decreased activity tolerance, decreased balance, decreased cognition, decreased coordination, decreased endurance, decreased knowledge of use of DME, decreased mobility, difficulty walking, decreased ROM, decreased strength, decreased safety awareness, increased fascial restrictions, increased muscle spasms, impaired flexibility, improper body mechanics, and postural dysfunction.    ACTIVITY LIMITATIONS carrying, lifting, bending, sitting, standing, squatting, and stairs   PARTICIPATION LIMITATIONS: meal prep, cleaning, laundry, community activity, and yard work   PERSONAL FACTORS Age, Education, Past/current experiences, Time since onset of injury/illness/exacerbation, and 1 comorbidity: N  are also affecting patient's functional outcome.    REHAB POTENTIAL: Good   CLINICAL DECISION MAKING: Evolving/moderate complexity   EVALUATION COMPLEXITY: Moderate     GOALS: Goals reviewed with patient? Yes   SHORT TERM GOALS: Target date: 04/15/2022   Pt will be independent with HEP in  order to improve strength and balance in order to decrease fall risk and improve function at home and work.   Baseline: Goal status: INITIAL     LONG TERM  GOALS: Target date: 05/13/2022   Pt will demonstrate 1RM hamstring curl knee flex of LLE of at least 40.5# in order to demonstrate 90% of RLE, PLOF strength to complete heavy outdoor ADLs Baseline: 03/16/22 LLE 1RM 25# Goal status: INITIAL   2.  Pt will decrease worst pain as reported on NPRS by at least 3 points in order to demonstrate clinically significant reduction in pain.   Baseline: 4/10 Goal status: INITIAL   3.  Patient will increase FOTO score to 60 to demonstrate predicted increase in functional mobility to complete ADLs   Baseline: 51 Goal status: INITIAL   4.  Pt will increase 10MWT to  at least 1.20.13 m/s in order to demonstrate clinically significant improvement in uninhibited community ambulation.    Baseline: self selected 0.21ms; fastest 1.023m Goal status: INITIAL   5. Pt will decrease 5TSTS by at least 3 seconds in order to demonstrate clinically significant improvement in LE strength   Baseline: 19sec Goal status: INITIAL                     PLAN: PT FREQUENCY: 1-2x/week   PT DURATION: 8 weeks   PLANNED INTERVENTIONS: Therapeutic exercises, Therapeutic activity, Neuromuscular re-education, Balance training, Gait training, Patient/Family education, Self Care, Joint mobilization, Joint manipulation, Dry Needling, Cryotherapy, and Moist heat.   PLAN FOR NEXT SESSION: DGI, eccentric hamstring strengthening       ChDurwin RegesPT ChDurwin RegesPT 04/08/2022, 10:21 AM

## 2022-04-10 DIAGNOSIS — J449 Chronic obstructive pulmonary disease, unspecified: Secondary | ICD-10-CM | POA: Diagnosis not present

## 2022-04-10 DIAGNOSIS — M1710 Unilateral primary osteoarthritis, unspecified knee: Secondary | ICD-10-CM | POA: Diagnosis not present

## 2022-04-12 ENCOUNTER — Ambulatory Visit: Payer: PPO | Admitting: Physical Therapy

## 2022-04-12 ENCOUNTER — Encounter: Payer: Self-pay | Admitting: Physical Therapy

## 2022-04-12 DIAGNOSIS — M25552 Pain in left hip: Secondary | ICD-10-CM

## 2022-04-12 NOTE — Therapy (Signed)
OUTPATIENT PHYSICAL THERAPY SCREEN   Patient Name: [name] MRN: [id number] DOB:[birth year], 81 y.o., male Today's Date: 04/12/2022  PCP: Ria Bush MD REFERRING PROVIDER: Gerrit Halls PA  END OF SESSION:   PT End of Session - 04/12/22 1009     Visit Number 5    Number of Visits 17    Date for PT Re-Evaluation 05/18/22    Authorization Type 7/10 Medicare    Authorization - Visit Number 5    Authorization - Number of Visits 10    Progress Note Due on Visit 10    PT Start Time 1004    PT Stop Time 1042    PT Time Calculation (min) 38 min    Activity Tolerance Treatment limited secondary to medical complications (Comment)    Behavior During Therapy [facility] for tasks assessed/performed                 Past Medical History:  Diagnosis Date   (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 06/2019 Echo (in setting of COVID): EF 35-40%, mild LVH, g1 DD, glob HK; b. 09/2019 Echo: EF 50-55%, Gr1 DD; c. 03/2021 Echo: EF 50-55%, no rwma, mild LVH, GrI DD, nl RV fxn. RVSP 44.10mHg. Mild-mod dil LA. Triv MR. Mild-mod Ao sclerosis.   AAA (abdominal aortic aneurysm) (HCC)    Actinic keratosis    Anginal pain ([place]    BENIGN PROSTATIC HYPERTROPHY, WITH URINARY OBSTRUCTION 09/06/2007   CAD s/p CABG    a. 1990 s/p MI-->CABG x 2; b. 2002 s/p BMS to LCX; c. 06/2015 Cath: LM 50ost, LAD 100ost/p, 676m, D2 nl, LCX  patent stent, RCA 80p (small), LIMA->LAD nl, VG->D2 nl-->Med Rx; d. 05/2020 MV: fixed apical ant/apical defect. No signif ischemia; e. 04/2021 MV: EF 52%, no ischemia.   Carotid arterial disease ([place]   a. 08/2016 Carotid U/S: <39% bilat.   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   CKD (chronic kidney disease), stage III ([place]   Community acquired pneumonia of right lower lobe of lung 06/05/2017   COVID-19 virus infection 05/30/2019   Covid PNA with hospitalization 05/2019   DUPUYTREN'S CONTRACTURE, RIGHT  10/29/2008   DVT, HX OF 1998   Emphysema of lung ([place]   GERD 04/30/2007   Headache    hx migraines   History of hiatal hernia    History of shingles    HYPERLIPIDEMIA 04/26/2007   HYPERTENSION 04/30/2007   INGUINAL HERNIA, RIGHT 05/26/2010   Laceration of skin of left hand 03/08/2018   Lumbar disc disease with radiculopathy    neuropathy in feet   Myocardial infarction ([facility]   1989, 2002   OSA (obstructive sleep apnea)    a. did not tolerate CPAP.   Osteoarthritis    PAD (peripheral artery disease) ([place]   a. 07/2017 LE duplex: RSFA 75-9927mSFA 75-19m67m-74d; c. 08/2018 Periph Angio: No signif AoIliac dzs. Mod-sev Ca2+ RSFA w/ diff dzs throughout- 3 vessel runoff. Borderline signif LSFA dzs w/ mod-sev Ca2+ vessels and 3 vessel runoff below the knee-->Med rx.   Pneumonia 07/2014   [facility] hospitalization   Pneumonia due to COVID-19 virus 06/02/2019   Pre-diabetes    Prostatitis, chronic    PSA, INCREASED 07/09/2008   Spinal stenosis of lumbar region    Past Surgical History:  Procedure Laterality Date   ABDOMINAL AORTOGRAM W/LOWER EXTREMITY [name] 09/12/2018   Procedure: ABDOMINAL AORTOGRAM W/LOWER EXTREMITY;  Surgeon:  [name], MD;  Location: [facility];  Service: Cardiovascular;  Laterality: [name];   APPENDECTOMY     rupture   BACK SURGERY     1984 and then [facility]   BLADDER INSTILLATION [name] 10/19/2021   Procedure: BLADDER INSTILLATION OF GEMCITABINE;  Surgeon: Abbie Sons, MD;  Location: [facility];  Service: Urology;  Laterality: [name];   CARDIAC CATHETERIZATION [name] 07/07/2015   Procedure: Left Heart Cath and Cors/Grafts Angiography;  Surgeon: [name], MD;  Location: [facility];  Service: Cardiovascular;  Laterality: [name];   CATARACT EXTRACTION, BILATERAL  2009   CERVICAL SPINE SURGERY  12/2016   cervical stenosis [name])   COLONOSCOPY  03/2010   HP polyp, diverticulosis, rpt 10 yrs ([name])   CORONARY ANGIOPLASTY   11/30/2000   LCX   CORONARY ARTERY BYPASS GRAFT  1990   3 vessel    CYSTOSCOPY  12/23/2010   [facility]   EYE SURGERY     cataract   JOINT REPLACEMENT     KNEE ARTHROSCOPY Right    x2   LAMINOTOMY  1986   L5/S1 lumbar laminotomy for two ruptured discs/fusion   LUMBAR LAMINECTOMY/DECOMPRESSION MICRODISCECTOMY [name] 07/13/2016   Procedure: LUMBAR TWO-THREE, LUMBAR THREE-FOUR, LUMBAR FOUR-FIVE LAMINECTOMY AND FORAMINOTOMY;  Surgeon: [name], MD;  Location: [facility];  Service: Neurosurgery;  Laterality: [name];  LAMINECTOMY AND FORAMINOTOMY L2-L3, L3-L4,L4-L5   LUMBAR SPINE SURGERY  06/2020   Bilateral redo laminectomy/laminotomy/foraminotomies/medial facetectomy to decompress the bilateral L4 and L5 nerve roots [name])   TOTAL HIP ARTHROPLASTY Bilateral 6440,3474   TOTAL KNEE ARTHROPLASTY Right 03/06/2017   Procedure: RIGHT TOTAL KNEE ARTHROPLASTY;  Surgeon: Gaynelle Arabian, MD;  Location: [facility];  Service: Orthopedics;  Laterality: Right;   TRANSURETHRAL RESECTION OF BLADDER TUMOR [name] 10/19/2021   Procedure: TRANSURETHRAL RESECTION OF BLADDER TUMOR (TURBT);  Surgeon: Abbie Sons, MD;  Location: [facility];  Service: Urology;  Laterality: [name];   Patient Active Problem List   Diagnosis Date Noted   Right low back pain 12/29/2021   COPD exacerbation ([place]) 11/12/2021   Bladder cancer ([place]) 11/12/2021   Acute-on-chronic kidney injury ([place]) 07/09/2021   OSA (obstructive sleep apnea)    Acute on chronic diastolic CHF (congestive heart failure) ([place])    Right leg pain 06/22/2021   Nocturnal hypoxia 06/11/2021   CKD (chronic kidney disease) stage 3, GFR 30-59 ml/min ([place]) 02/24/2021   Peripheral neuropathy 02/23/2021   Spondylolisthesis of lumbar region 07/07/2020   COPD (chronic obstructive pulmonary disease) ([place]) 01/14/2020   Coccydynia 09/20/2019   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[place]) 07/08/2019   Anxiety 06/04/2019   Chronic right shoulder pain 09/10/2018   Cervical stenosis of  spinal canal 06/05/2017   Pedal edema 06/05/2017   Health maintenance examination 12/06/2016   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/13/2016   Overweight (BMI 25.0-29.9) 07/04/2016   Low vitamin B12 level 01/01/2016   Exertional dyspnea 07/07/2015   Medicare annual wellness visit, subsequent 07/03/2015   Advanced care planning/counseling discussion 07/03/2015   Acute recurrent frontal sinusitis 05/05/2015   Type 2 diabetes mellitus with other specified complication ([place]) 25/95/6387   Pleuritic chest pain 05/04/2015   Ex-smoker 05/04/2015   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   Spermatocele 04/02/2013   Syncopal vertigo 11/04/2010   Lumbar disc disease with radiculopathy 11/04/2010   CAD, ARTERY BYPASS GRAFT 08/11/2009   PAD (peripheral artery disease) ([place]) 08/11/2009   DUPUYTREN'S CONTRACTURE, 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   Carotid stenosis 07/09/2008  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 He was lifting something heavy and heard and felt a large pop. He did an MRI done that "showed a tear in the hamstring" but more importantly that he had bladder cancer. He went through 6 weeks of treatment for this, and is just now able to address the proximal hamstring pain. Pain has gotten better since initial injury but is still present. Pain is a dull ache that is constant. Worst pain 4/10; best 0/10. Pain is worsened by sitting, bending forward to pick things up and getting things out of his garden. Pt is retired, but enjoys gardening and wood working. Pt reports 2 falls in past 6  months, one time tripping over a rock, tripped getting off his lawnmower but caught himself. Has some lingering SOB from having COVID in 2020, has RW and SPC at home because of this but does not use. Walking more limited by SOB symptoms, unlimited with hamstring. Standing is unlimited. Pt denies N/V, B&B changes, unexplained weight fluctuation, saddle paresthesia, fever, night sweats, or unrelenting night pain at this time.    PRECAUTIONS: none  SUBJECTIVE: Pt reports Saturday he got really hot being in a theater and since has been feeling "weak" from sitting around. Has been taking his BP at home with some diastolic in the upper 91Y/NWGNF 60s. No dizziness. Having more pain in the hamstring yesterday when he was sitting for a while. Reports 2/10 pain currently.    PAIN:  Are you having pain? No   OBJECTIVE: (objective measures completed at initial evaluation unless otherwise dated)  DIAGNOSTIC FINDINGS:  MRI hamstring at [facility] Jan 2022 with partial tear of prox hamstring (per pt). Several other imaging in chart   PATIENT SURVEYS:  FOTO 51 with goal of 60   SCREENING FOR RED FLAGS: Bowel or bladder incontinence: No Spinal tumors: No Cauda equina syndrome: No Compression fracture: No Abdominal aneurysm: No   COGNITION:           Overall cognitive status: Within functional limits for tasks assessed                          SENSATION: WFL   MUSCLE LENGTH: Hamstrings: Right 145d deg; Left 120deg     POSTURE: decreased full hip ext bilat with decreased lumbar lordosis; lower crossed. Forward head, rounded shoulders, increased thoracic kyphosis   PALPATION: TTP with concordant pain to palpation of proximal hamstring insertion. Latent trigger points to mid glute max fiers over piriformis. No TTP at mid hamstring group muscle belly, distal insertion, of glute musculature   LUMBAR ROM:    Active  A/PROM  eval  Flexion WNL in sitting; in standing limited by hamstrings   Extension 50% limited  Right lateral flexion    Left lateral flexion    Right rotation 50% limited  Left rotation 50% limited   (Blank rows = not tested)   LOWER EXTREMITY ROM:      Active/PROM Right eval Left eval  Hip flexion 100/100 100/100  Hip extension 0/10 0/10  Hip abduction Wnl  wnl  Hip adduction wnl WNL  Hip internal rotation 25d both 20d both  Hip external rotation WNL WNL  Knee flexion 130 Both 128 Both  Knee extension WNL WNL   Ankle dorsiflexion 10d/18d 10d/18d  Ankle plantarflexion      Ankle inversion      Ankle eversion       (Blank rows = not tested)   Knee flex in elys position L: 95d R 90d   LOWER EXTREMITY MMT:     MMT Right eval Left eval  Hip flexion 5 5  Hip extension 3- 2+  Hip abduction 4- 4-  Hip adduction      Hip internal rotation 24 20  Hip external rotation WNL WNL  Knee flexion 5 4 with concordant pain  Knee extension 5 5  Ankle dorsiflexion 5 5  Ankle plantarflexion      Ankle inversion      Ankle eversion       (Blank rows = not tested)   1RM Hamstring curl R 45# L 25#   LUMBAR SPECIAL TESTS:  FABER test: Negative and   FADIR TEST: NEGATIVE BUT WITH "STRETCH SENSATION   FUNCTIONAL TESTS:  5 times sit to stand: 19SEC 10 meter walk test: self selected 0.24ms fastest 1.02m SLS R: 14sec L: 21sec Squat only to parallel before heel raise compensation is needed   GAIT:              5037fith no AD used decreased speed, decreased terminal hip ext bilat presenting with decreased step length bilat, decreased RLE stance time with shortened LLE step > RLE     TODAY'S TREATMENT  Todays Intervention  Start of session: 146/57 pulse 76 Nustep seat 10 BLEs L4 for gentle strengthening BP following 161/54  Bridge LLE only from 6in step  3x 6 with good carry over of cuing with glute activation > lumbar ext L single leg striaght leg deadlift with 2 finger balance support 2x 8 with good carry over of initial cuing and demo Prone  eccentric hamstring curl 7.2# AW x6; 2x 8 with 5# AW; better carry over of cuing to prevent hip flex with 5# AW BP following 148/57  Seated L hamstring stretch 30sec    PATIENT EDUCATION:  Education details: HEP review. Education on need to reach out to PCP about BP concerns with pt understanding Person educated: Patient Education method: Explanation, Demonstration, Tactile cues, and Handouts Education comprehension: verbalized understanding, returned demonstration, and verbal cues required     HOME EXERCISE PROGRAM: Gentle hamstring stretch   ASSESSMENT:   CLINICAL IMPRESSION: PT continued therex progression for increased eccentric hamstring strengthening and lengthening with success. Patient vitals monitored throughout session, within limits to be able to complete PT session. Pt is able to comply with all cuing for proper technique of therex with good effort throughout session; no increased pain. PT will continue progression as able.      OBJECTIVE IMPAIRMENTS Abnormal gait, decreased activity tolerance, decreased balance, decreased cognition, decreased coordination, decreased endurance, decreased knowledge of use of DME, decreased mobility, difficulty walking, decreased ROM, decreased strength, decreased safety awareness, increased fascial restrictions, increased muscle spasms, impaired flexibility, improper body mechanics, and postural dysfunction.    ACTIVITY LIMITATIONS carrying, lifting, bending, sitting, standing, squatting, and stairs   PARTICIPATION LIMITATIONS: meal prep, cleaning, laundry, community activity, and yard work   PERSONAL FACTORS Age, Education, Past/current experiences, Time since onset of injury/illness/exacerbation, and 1 comorbidity: N  are also affecting patient's functional outcome.  REHAB POTENTIAL: Good   CLINICAL DECISION MAKING: Evolving/moderate complexity   EVALUATION COMPLEXITY: Moderate     GOALS: Goals reviewed with patient? Yes   SHORT  TERM GOALS: Target date: 04/15/2022   Pt will be independent with HEP in order to improve strength and balance in order to decrease fall risk and improve function at home and work.   Baseline: Goal status: INITIAL     LONG TERM GOALS: Target date: 05/13/2022   Pt will demonstrate 1RM hamstring curl knee flex of LLE of at least 40.5# in order to demonstrate 90% of RLE, PLOF strength to complete heavy outdoor ADLs Baseline: 03/16/22 LLE 1RM 25# Goal status: INITIAL   2.  Pt will decrease worst pain as reported on NPRS by at least 3 points in order to demonstrate clinically significant reduction in pain.   Baseline: 4/10 Goal status: INITIAL   3.  Patient will increase FOTO score to 60 to demonstrate predicted increase in functional mobility to complete ADLs   Baseline: 51 Goal status: INITIAL   4.  Pt will increase 10MWT to  at least 1.20.13 m/s in order to demonstrate clinically significant improvement in uninhibited community ambulation.    Baseline: self selected 0.41ms; fastest 1.081m Goal status: INITIAL   5. Pt will decrease 5TSTS by at least 3 seconds in order to demonstrate clinically significant improvement in LE strength   Baseline: 19sec Goal status: INITIAL                     PLAN: PT FREQUENCY: 1-2x/week   PT DURATION: 8 weeks   PLANNED INTERVENTIONS: Therapeutic exercises, Therapeutic activity, Neuromuscular re-education, Balance training, Gait training, Patient/Family education, Self Care, Joint mobilization, Joint manipulation, Dry Needling, Cryotherapy, and Moist heat.   PLAN FOR NEXT SESSION: DGI, eccentric hamstring strengthening       ChDurwin RegesPT ChDurwin RegesPT 04/12/2022, 10:42 AM

## 2022-04-13 ENCOUNTER — Ambulatory Visit
Admission: RE | Admit: 2022-04-13 | Discharge: 2022-04-13 | Disposition: A | Discharge status: Home / Self Care | Payer: PPO | Source: Ambulatory Visit | Attending: Urology | Admitting: Urology

## 2022-04-13 ENCOUNTER — Other Ambulatory Visit: Payer: Self-pay | Admitting: Urology

## 2022-04-13 DIAGNOSIS — M7072 Other bursitis of hip, left hip: Secondary | ICD-10-CM | POA: Diagnosis not present

## 2022-04-13 DIAGNOSIS — C679 Malignant neoplasm of bladder, unspecified: Secondary | ICD-10-CM | POA: Insufficient documentation

## 2022-04-13 DIAGNOSIS — K5730 Diverticulosis of large intestine without perforation or abscess without bleeding: Secondary | ICD-10-CM | POA: Diagnosis not present

## 2022-04-13 DIAGNOSIS — N2882 Megaloureter: Secondary | ICD-10-CM | POA: Diagnosis not present

## 2022-04-13 MED ORDER — GADOBUTROL 1 MMOL/ML IV SOLN
9.0000 mL | Freq: Once | INTRAVENOUS | Status: AC | PRN
  Administered 2022-04-13: 9 mL via INTRAVENOUS

## 2022-04-14 ENCOUNTER — Encounter: Payer: Self-pay | Admitting: Physical Therapy

## 2022-04-14 ENCOUNTER — Ambulatory Visit: Payer: PPO | Admitting: Physical Therapy

## 2022-04-14 DIAGNOSIS — M25552 Pain in left hip: Secondary | ICD-10-CM | POA: Diagnosis not present

## 2022-04-14 NOTE — Therapy (Signed)
OUTPATIENT PHYSICAL THERAPY SCREEN   Patient Name: [name] MRN: [id number] DOB:[birth year], 81 y.o., male Today's Date: 04/14/2022  PCP: Ria Bush MD REFERRING PROVIDER: Gerrit Halls PA  END OF SESSION:   PT End of Session - 04/14/22 0751     Visit Number 6    Number of Visits 17    Date for PT Re-Evaluation 05/18/22    Authorization Type 7/10 Medicare    Authorization - Visit Number 6    Authorization - Number of Visits 10    Progress Note Due on Visit 10    PT Start Time 0748    PT Stop Time 0830    PT Time Calculation (min) 42 min    Activity Tolerance Treatment limited secondary to medical complications (Comment)    Behavior During Therapy [facility] for tasks assessed/performed                  Past Medical History:  Diagnosis Date   (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 06/2019 Echo (in setting of COVID): EF 35-40%, mild LVH, g1 DD, glob HK; b. 09/2019 Echo: EF 50-55%, Gr1 DD; c. 03/2021 Echo: EF 50-55%, no rwma, mild LVH, GrI DD, nl RV fxn. RVSP 44.89mHg. Mild-mod dil LA. Triv MR. Mild-mod Ao sclerosis.   AAA (abdominal aortic aneurysm) (HCC)    Actinic keratosis    Anginal pain ([facility]    BENIGN PROSTATIC HYPERTROPHY, WITH URINARY OBSTRUCTION 09/06/2007   CAD s/p CABG    a. 1990 s/p MI-->CABG x 2; b. 2002 s/p BMS to LCX; c. 06/2015 Cath: LM 50ost, LAD 100ost/p, 675m, D2 nl, LCX  patent stent, RCA 80p (small), LIMA->LAD nl, VG->D2 nl-->Med Rx; d. 05/2020 MV: fixed apical ant/apical defect. No signif ischemia; e. 04/2021 MV: EF 52%, no ischemia.   Carotid arterial disease ([facility]   a. 08/2016 Carotid U/S: <39% bilat.   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   CKD (chronic kidney disease), stage III ([facility]   Community acquired pneumonia of right lower lobe of lung 06/05/2017   COVID-19 virus infection 05/30/2019   Covid PNA with hospitalization 05/2019   DUPUYTREN'S CONTRACTURE, RIGHT  10/29/2008   DVT, HX OF 1998   Emphysema of lung ([facility]   GERD 04/30/2007   Headache    hx migraines   History of hiatal hernia    History of shingles    HYPERLIPIDEMIA 04/26/2007   HYPERTENSION 04/30/2007   INGUINAL HERNIA, RIGHT 05/26/2010   Laceration of skin of left hand 03/08/2018   Lumbar disc disease with radiculopathy    neuropathy in feet   Myocardial infarction ([facility]   1989, 2002   OSA (obstructive sleep apnea)    a. did not tolerate CPAP.   Osteoarthritis    PAD (peripheral artery disease) ([facility]   a. 07/2017 LE duplex: RSFA 75-994mSFA 75-43m32m-74d; c. 08/2018 Periph Angio: No signif AoIliac dzs. Mod-sev Ca2+ RSFA w/ diff dzs throughout- 3 vessel runoff. Borderline signif LSFA dzs w/ mod-sev Ca2+ vessels and 3 vessel runoff below the knee-->Med rx.   Pneumonia 07/2014   [facility] hospitalization   Pneumonia due to COVID-19 virus 06/02/2019   Pre-diabetes    Prostatitis, chronic    PSA, INCREASED 07/09/2008   Spinal stenosis of lumbar region    Past Surgical History:  Procedure Laterality Date   ABDOMINAL AORTOGRAM W/LOWER EXTREMITY [name] 09/12/2018   Procedure: ABDOMINAL AORTOGRAM W/LOWER EXTREMITY;  Surgeon: [name], MD;  Location: [place] [facility];  Service: Cardiovascular;  Laterality: [name];   APPENDECTOMY     rupture   BACK SURGERY     1984 and then another one [facility]   BLADDER INSTILLATION [name] 10/19/2021   Procedure: BLADDER INSTILLATION OF GEMCITABINE;  Surgeon: Abbie Sons, MD;  Location: [facility];  Service: Urology;  Laterality: [name];   CARDIAC CATHETERIZATION [name] 07/07/2015   Procedure: Left Heart Cath and Cors/Grafts Angiography;  Surgeon: [name], MD;  Location: [place] [facility];  Service: Cardiovascular;  Laterality: [name];   CATARACT EXTRACTION, BILATERAL  2009   CERVICAL SPINE SURGERY  12/2016   cervical stenosis [name])   COLONOSCOPY  03/2010   HP polyp, diverticulosis, rpt 10 yrs ([name])   CORONARY ANGIOPLASTY   11/30/2000   LCX   CORONARY ARTERY BYPASS GRAFT  1990   3 vessel    CYSTOSCOPY  12/23/2010   [place]   EYE SURGERY     cataract   JOINT REPLACEMENT     KNEE ARTHROSCOPY Right    x2   LAMINOTOMY  1986   L5/S1 lumbar laminotomy for two ruptured discs/fusion   LUMBAR LAMINECTOMY/DECOMPRESSION MICRODISCECTOMY [name] 07/13/2016   Procedure: LUMBAR TWO-THREE, LUMBAR THREE-FOUR, LUMBAR FOUR-FIVE LAMINECTOMY AND FORAMINOTOMY;  Surgeon: [name], MD;  Location: [place];  Service: Neurosurgery;  Laterality: [name];  LAMINECTOMY AND FORAMINOTOMY L2-L3, L3-L4,L4-L5   LUMBAR SPINE SURGERY  06/2020   Bilateral redo laminectomy/laminotomy/foraminotomies/medial facetectomy to decompress the bilateral L4 and L5 nerve roots [name])   TOTAL HIP ARTHROPLASTY Bilateral 4081,4481   TOTAL KNEE ARTHROPLASTY Right 03/06/2017   Procedure: RIGHT TOTAL KNEE ARTHROPLASTY;  Surgeon: Gaynelle Arabian, MD;  Location: [facility];  Service: Orthopedics;  Laterality: Right;   TRANSURETHRAL RESECTION OF BLADDER TUMOR [name] 10/19/2021   Procedure: TRANSURETHRAL RESECTION OF BLADDER TUMOR (TURBT);  Surgeon: Abbie Sons, MD;  Location: [facility];  Service: Urology;  Laterality: [name];   Patient Active Problem List   Diagnosis Date Noted   Right low back pain 12/29/2021   COPD exacerbation ([place]) 11/12/2021   Bladder cancer ([place]) 11/12/2021   Acute-on-chronic kidney injury ([place]) 07/09/2021   OSA (obstructive sleep apnea)    Acute on chronic diastolic CHF (congestive heart failure) ([place])    Right leg pain 06/22/2021   Nocturnal hypoxia 06/11/2021   CKD (chronic kidney disease) stage 3, GFR 30-59 ml/min ([place]) 02/24/2021   Peripheral neuropathy 02/23/2021   Spondylolisthesis of lumbar region 07/07/2020   COPD (chronic obstructive pulmonary disease) ([place]) 01/14/2020   Coccydynia 09/20/2019   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[place]) 07/08/2019   Anxiety 06/04/2019   Chronic right shoulder pain 09/10/2018   Cervical stenosis of  spinal canal 06/05/2017   Pedal edema 06/05/2017   Health maintenance examination 12/06/2016   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/13/2016   Overweight (BMI 25.0-29.9) 07/04/2016   Low vitamin B12 level 01/01/2016   Exertional dyspnea 07/07/2015   Medicare annual wellness visit, subsequent 07/03/2015   Advanced care planning/counseling discussion 07/03/2015   Acute recurrent frontal sinusitis 05/05/2015   Type 2 diabetes mellitus with other specified complication ([place]) 85/63/1497   Pleuritic chest pain 05/04/2015   Ex-smoker 05/04/2015   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   Spermatocele 04/02/2013   Syncopal vertigo 11/04/2010   Lumbar disc disease with radiculopathy 11/04/2010   CAD, ARTERY BYPASS GRAFT 08/11/2009   PAD (peripheral artery disease) ([place]) 08/11/2009   DUPUYTREN'S CONTRACTURE, 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   Carotid stenosis  07/09/2008   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 He was lifting something heavy and heard and felt a large pop. He did an MRI done that "showed a tear in the hamstring" but more importantly that he had bladder cancer. He went through 6 weeks of treatment for this, and is just now able to address the proximal hamstring pain. Pain has gotten better since initial injury but is still present. Pain is a dull ache that is constant. Worst pain 4/10; best 0/10. Pain is worsened by sitting, bending forward to pick things up and getting things out of his garden. Pt is retired, but enjoys gardening and wood working. Pt reports 2 falls in past 6  months, one time tripping over a rock, tripped getting off his lawnmower but caught himself. Has some lingering SOB from having COVID in 2020, has RW and SPC at home because of this but does not use. Walking more limited by SOB symptoms, unlimited with hamstring. Standing is unlimited. Pt denies N/V, B&B changes, unexplained weight fluctuation, saddle paresthesia, fever, night sweats, or unrelenting night pain at this time.    PRECAUTIONS: none  SUBJECTIVE: Pt reports yesterday his hamstring was sore following PT. Reports more OA pain of R knee. Patient reports   PAIN:  Are you having pain? No   OBJECTIVE: (objective measures completed at initial evaluation unless otherwise dated)  DIAGNOSTIC FINDINGS:  MRI hamstring at [facility] Jan 2022 with partial tear of prox hamstring (per pt). Several other imaging in chart   PATIENT SURVEYS:  FOTO 51 with goal of 60   SCREENING FOR RED FLAGS: Bowel or bladder incontinence: No Spinal tumors: No Cauda equina syndrome: No Compression fracture: No Abdominal aneurysm: No   COGNITION:           Overall cognitive status: Within functional limits for tasks assessed                          SENSATION: WFL   MUSCLE LENGTH: Hamstrings: Right 145d deg; Left 120deg     POSTURE: decreased full hip ext bilat with decreased lumbar lordosis; lower crossed. Forward head, rounded shoulders, increased thoracic kyphosis   PALPATION: TTP with concordant pain to palpation of proximal hamstring insertion. Latent trigger points to mid glute max fiers over piriformis. No TTP at mid hamstring group muscle belly, distal insertion, of glute musculature   LUMBAR ROM:    Active  A/PROM  eval  Flexion WNL in sitting; in standing limited by hamstrings  Extension 50% limited  Right lateral flexion    Left lateral flexion    Right rotation 50% limited  Left rotation 50% limited   (Blank rows = not tested)   LOWER EXTREMITY ROM:      Active/PROM  Right eval Left eval  Hip flexion 100/100 100/100  Hip extension 0/10 0/10  Hip abduction Wnl wnl  Hip adduction wnl WNL  Hip internal rotation 25d both 20d both  Hip external rotation WNL WNL  Knee flexion 130 Both 128 Both  Knee extension WNL WNL   Ankle  dorsiflexion 10d/18d 10d/18d  Ankle plantarflexion      Ankle inversion      Ankle eversion       (Blank rows = not tested)   Knee flex in elys position L: 95d R 90d   LOWER EXTREMITY MMT:     MMT Right eval Left eval  Hip flexion 5 5  Hip extension 3- 2+  Hip abduction 4- 4-  Hip adduction      Hip internal rotation 24 20  Hip external rotation WNL WNL  Knee flexion 5 4 with concordant pain  Knee extension 5 5  Ankle dorsiflexion 5 5  Ankle plantarflexion      Ankle inversion      Ankle eversion       (Blank rows = not tested)   1RM Hamstring curl R 45# L 25#   LUMBAR SPECIAL TESTS:  FABER test: Negative and   FADIR TEST: NEGATIVE BUT WITH "STRETCH SENSATION   FUNCTIONAL TESTS:  5 times sit to stand: 19SEC 10 meter walk test: self selected 0.35ms fastest 1.050m SLS R: 14sec L: 21sec Squat only to parallel before heel raise compensation is needed   GAIT:              5032fith no AD used decreased speed, decreased terminal hip ext bilat presenting with decreased step length bilat, decreased RLE stance time with shortened LLE step > RLE     TODAY'S TREATMENT  Todays Intervention  Start of session: 142/59  Nustep seat 10 BLEs L4 for gentle strengthening BP following 150/56  Single leg sit to stand to elevated mat (RLE heel touch for balance) 3x 8 with max cuing needed for technique without compensation with eventual carry over L single leg striaght leg deadlift with 2 finger balance support 5# DB in RUE 2x 10 with good carry over of initial cuing and demo OMEGA eccentric hamstring curl 25# 3x 10 ; 35# x5 with encouragement needed BP following 148/62 Seated L hamstring stretch 30sec    PATIENT  EDUCATION:  Education details: HEP review. Education on need to reach out to PCP about BP concerns with pt understanding Person educated: Patient Education method: Explanation, Demonstration, Tactile cues, and Handouts Education comprehension: verbalized understanding, returned demonstration, and verbal cues required     HOME EXERCISE PROGRAM: Gentle hamstring stretch   ASSESSMENT:   CLINICAL IMPRESSION: PT continued therex progression for increased eccentric hamstring strengthening and lengthening with success. Patient vitals monitored throughout session, within limits to be able to complete PT session. Pt is able to comply with all cuing for proper technique of therex with good effort throughout session; no increased pain. PT will continue progression as able.      OBJECTIVE IMPAIRMENTS Abnormal gait, decreased activity tolerance, decreased balance, decreased cognition, decreased coordination, decreased endurance, decreased knowledge of use of DME, decreased mobility, difficulty walking, decreased ROM, decreased strength, decreased safety awareness, increased fascial restrictions, increased muscle spasms, impaired flexibility, improper body mechanics, and postural dysfunction.    ACTIVITY LIMITATIONS carrying, lifting, bending, sitting, standing, squatting, and stairs   PARTICIPATION LIMITATIONS: meal prep, cleaning, laundry, community activity, and yard work   PERSONAL FACTORS Age, Education, Past/current experiences, Time since onset of injury/illness/exacerbation, and 1 comorbidity: N  are also affecting patient's functional outcome.    REHAB POTENTIAL: Good   CLINICAL DECISION MAKING: Evolving/moderate complexity   EVALUATION COMPLEXITY: Moderate     GOALS: Goals reviewed with patient? Yes   SHORT TERM GOALS: Target date: 04/15/2022  Pt will be independent with HEP in order to improve strength and balance in order to decrease fall risk and improve function at home and work.    Baseline: Goal status: INITIAL     LONG TERM GOALS: Target date: 05/13/2022   Pt will demonstrate 1RM hamstring curl knee flex of LLE of at least 40.5# in order to demonstrate 90% of RLE, PLOF strength to complete heavy outdoor ADLs Baseline: 03/16/22 LLE 1RM 25# Goal status: INITIAL   2.  Pt will decrease worst pain as reported on NPRS by at least 3 points in order to demonstrate clinically significant reduction in pain.   Baseline: 4/10 Goal status: INITIAL   3.  Patient will increase FOTO score to 60 to demonstrate predicted increase in functional mobility to complete ADLs   Baseline: 51 Goal status: INITIAL   4.  Pt will increase 10MWT to  at least 1.20.13 m/s in order to demonstrate clinically significant improvement in uninhibited community ambulation.    Baseline: self selected 0.29ms; fastest 1.068m Goal status: INITIAL   5. Pt will decrease 5TSTS by at least 3 seconds in order to demonstrate clinically significant improvement in LE strength   Baseline: 19sec Goal status: INITIAL                     PLAN: PT FREQUENCY: 1-2x/week   PT DURATION: 8 weeks   PLANNED INTERVENTIONS: Therapeutic exercises, Therapeutic activity, Neuromuscular re-education, Balance training, Gait training, Patient/Family education, Self Care, Joint mobilization, Joint manipulation, Dry Needling, Cryotherapy, and Moist heat.   PLAN FOR NEXT SESSION: DGI, eccentric hamstring strengthening       ChDurwin RegesPT ChDurwin RegesPT 04/14/2022, 8:27 AM

## 2022-04-15 ENCOUNTER — Other Ambulatory Visit: Payer: Self-pay | Admitting: Cardiovascular Disease

## 2022-04-17 DIAGNOSIS — I10 Essential (primary) hypertension: Secondary | ICD-10-CM | POA: Diagnosis not present

## 2022-04-17 DIAGNOSIS — G4733 Obstructive sleep apnea (adult) (pediatric): Secondary | ICD-10-CM | POA: Diagnosis not present

## 2022-04-17 DIAGNOSIS — R0683 Snoring: Secondary | ICD-10-CM | POA: Diagnosis not present

## 2022-04-19 ENCOUNTER — Other Ambulatory Visit: Payer: Self-pay | Admitting: Urology

## 2022-04-19 ENCOUNTER — Other Ambulatory Visit: Payer: Self-pay | Admitting: Cardiovascular Disease

## 2022-04-19 ENCOUNTER — Encounter: Payer: PPO | Admitting: Physical Therapy

## 2022-04-21 ENCOUNTER — Ambulatory Visit: Payer: PPO | Attending: Physician Assistant | Admitting: Physical Therapy

## 2022-04-21 ENCOUNTER — Encounter: Payer: Self-pay | Admitting: Urology

## 2022-04-21 ENCOUNTER — Ambulatory Visit: Payer: PPO | Admitting: Urology

## 2022-04-21 ENCOUNTER — Encounter: Payer: Self-pay | Admitting: Physical Therapy

## 2022-04-21 VITALS — BP 135/74 | HR 90 | Ht 72.0 in | Wt 210.0 lb

## 2022-04-21 DIAGNOSIS — M25551 Pain in right hip: Secondary | ICD-10-CM | POA: Insufficient documentation

## 2022-04-21 DIAGNOSIS — Z8551 Personal history of malignant neoplasm of bladder: Secondary | ICD-10-CM

## 2022-04-21 DIAGNOSIS — D494 Neoplasm of unspecified behavior of bladder: Secondary | ICD-10-CM | POA: Diagnosis not present

## 2022-04-21 DIAGNOSIS — M25552 Pain in left hip: Secondary | ICD-10-CM | POA: Diagnosis not present

## 2022-04-21 LAB — URINALYSIS, COMPLETE
Bilirubin, UA: NEGATIVE
Glucose, UA: NEGATIVE
Ketones, UA: NEGATIVE
Leukocytes,UA: NEGATIVE
Nitrite, UA: NEGATIVE
Protein,UA: NEGATIVE
RBC, UA: NEGATIVE
Specific Gravity, UA: <1.005 — ABNORMAL LOW (ref 1.005–1.030)
Urobilinogen, Ur: 0.2 mg/dL (ref 0.2–1.0)
pH, UA: 5.5 (ref 5.0–7.5)

## 2022-04-21 LAB — MICROSCOPIC EXAMINATION: Bacteria, UA: NONE SEEN

## 2022-04-21 NOTE — Progress Notes (Unsigned)
   04/21/22  CC:  Chief Complaint  Patient presents with   Cysto   Urologic history: 1.  Ta high-grade urothelial carcinoma bladder Incidentally noted on pelvic MRI oh ordered by orthopedics to have a 15 x 20 mm bladder mass near right UO; cystoscopy with 3 cm papillary tumor TURBT 10/19/2025 with high-grade tumor and a low-grade tumor left posterolateral wall Induction BCG x6 completed 01/20/2022 MR urogram 03/2022 showed no upper tract abnormalities  2.  BPH with LUTS Cystoscopy moderate lateral lobe enlargement/bladder neck elevation On tamsulosin and tolterodine   HPI: No complaints today.  He tolerated BCG well.  Denies gross hematuria  Blood pressure 135/74, pulse 90, height 6' (1.829 m), weight 210 lb (95.3 kg).   Cystoscopy Procedure Note  Patient identification was confirmed, informed consent was obtained, and patient was prepped using Betadine solution.  Lidocaine jelly was administered per urethral meatus.     Pre-Procedure: - Inspection reveals a normal caliber urethral meatus.  Procedure: The flexible cystoscope was introduced without difficulty - No urethral strictures/lesions are present. - Prostate moderate lateral lobe enlargement -  Moderate elevation  bladder neck - Bilateral ureteral orifices identified - Bladder mucosa  reveals no ulcers, tumors, or lesions - No bladder stones -Mild-moderate trabeculation  Retroflexion shows no abnormalities/tumor   Post-Procedure: - Patient tolerated the procedure well  Assessment/ Plan: No evidence recurrent urothelial carcinoma Urine cytology ordered We discussed maintenance BCG which he would like to schedule.  Scheduling message sent Follow-up surveillance cystoscopy 3 months    Abbie Sons, MD

## 2022-04-21 NOTE — Therapy (Signed)
OUTPATIENT PHYSICAL THERAPY SCREEN   Patient Name: RUVIM RISKO MRN: [id number] DOB:1940-09-21, 81 y.o., male Today's Date: 04/21/2022  PCP: Ria Bush MD REFERRING PROVIDER: Gerrit Halls PA  END OF SESSION:   PT End of Session - 04/21/22 1359     Visit Number 7    Number of Visits 17    Date for PT Re-Evaluation 05/18/22    Authorization Type 7/10 Medicare    Authorization - Visit Number 7    Authorization - Number of Visits 10    Progress Note Due on Visit 10    PT Start Time 2585    PT Stop Time 1430    PT Time Calculation (min) 38 min    Activity Tolerance Treatment limited secondary to medical complications (Comment)    Behavior During Therapy [facility] for tasks assessed/performed                   Past Medical History:  Diagnosis Date   (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 06/2019 Echo (in setting of COVID): EF 35-40%, mild LVH, g1 DD, glob HK; b. 09/2019 Echo: EF 50-55%, Gr1 DD; c. 03/2021 Echo: EF 50-55%, no rwma, mild LVH, GrI DD, nl RV fxn. RVSP 44.41mHg. Mild-mod dil LA. Triv MR. Mild-mod Ao sclerosis.   AAA (abdominal aortic aneurysm) (HCC)    Actinic keratosis    Anginal pain ([place]    BENIGN PROSTATIC HYPERTROPHY, WITH URINARY OBSTRUCTION 09/06/2007   CAD s/p CABG    a. 1990 s/p MI-->CABG x 2; b. 2002 s/p BMS to LCX; c. 06/2015 Cath: LM 50ost, LAD 100ost/p, 630m, D2 nl, LCX  patent stent, RCA 80p (small), LIMA->LAD nl, VG->D2 nl-->Med Rx; d. 05/2020 MV: fixed apical ant/apical defect. No signif ischemia; e. 04/2021 MV: EF 52%, no ischemia.   Carotid arterial disease ([place]   a. 08/2016 Carotid U/S: <39% bilat.   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   CKD (chronic kidney disease), stage III ([place]   Community acquired pneumonia of right lower lobe of lung 06/05/2017   COVID-19 virus infection 05/30/2019   Covid PNA with hospitalization 05/2019   DUPUYTREN'S CONTRACTURE, RIGHT  10/29/2008   DVT, HX OF 1998   Emphysema of lung ([place]   GERD 04/30/2007   Headache    hx migraines   History of hiatal hernia    History of shingles    HYPERLIPIDEMIA 04/26/2007   HYPERTENSION 04/30/2007   INGUINAL HERNIA, RIGHT 05/26/2010   Laceration of skin of left hand 03/08/2018   Lumbar disc disease with radiculopathy    neuropathy in feet   Myocardial infarction ([place]   1989, 2002   OSA (obstructive sleep apnea)    a. did not tolerate CPAP.   Osteoarthritis    PAD (peripheral artery disease) ([place]   a. 07/2017 LE duplex: RSFA 75-9914mSFA 75-10m74m-74d; c. 08/2018 Periph Angio: No signif AoIliac dzs. Mod-sev Ca2+ RSFA w/ diff dzs throughout- 3 vessel runoff. Borderline signif LSFA dzs w/ mod-sev Ca2+ vessels and 3 vessel runoff below the knee-->Med rx.   Pneumonia 07/2014   [facility] hospitalization   Pneumonia due to COVID-19 virus 06/02/2019   Pre-diabetes    Prostatitis, chronic    PSA, INCREASED 07/09/2008   Spinal stenosis of lumbar region    Past Surgical History:  Procedure Laterality Date   ABDOMINAL AORTOGRAM W/LOWER EXTREMITY [name] 09/12/2018   Procedure: ABDOMINAL AORTOGRAM W/LOWER EXTREMITY;  Surgeon: [name], MD;  Location: [place] [facility];  Service: Cardiovascular;  Laterality: [name];   APPENDECTOMY     rupture   BACK SURGERY     1984 and then [facility] one [facility]   BLADDER INSTILLATION [name] 10/19/2021   Procedure: BLADDER INSTILLATION OF GEMCITABINE;  Surgeon: Abbie Sons, MD;  Location: [facility];  Service: Urology;  Laterality: [name];   CARDIAC CATHETERIZATION [name] 07/07/2015   Procedure: Left Heart Cath and Cors/Grafts Angiography;  Surgeon: [name], MD;  Location: [place] [facility];  Service: Cardiovascular;  Laterality: [name];   CATARACT EXTRACTION, BILATERAL  2009   CERVICAL SPINE SURGERY  12/2016   cervical stenosis [name])   COLONOSCOPY  03/2010   HP polyp, diverticulosis, rpt 10 yrs ([name])   CORONARY ANGIOPLASTY   11/30/2000   LCX   CORONARY ARTERY BYPASS GRAFT  1990   3 vessel    CYSTOSCOPY  12/23/2010   [place]   EYE SURGERY     cataract   JOINT REPLACEMENT     KNEE ARTHROSCOPY Right    x2   LAMINOTOMY  1986   L5/S1 lumbar laminotomy for two ruptured discs/fusion   LUMBAR LAMINECTOMY/DECOMPRESSION MICRODISCECTOMY [name] 07/13/2016   Procedure: LUMBAR TWO-THREE, LUMBAR THREE-FOUR, LUMBAR FOUR-FIVE LAMINECTOMY AND FORAMINOTOMY;  Surgeon: [name], MD;  Location: [place];  Service: Neurosurgery;  Laterality: [name];  LAMINECTOMY AND FORAMINOTOMY L2-L3, L3-L4,L4-L5   LUMBAR SPINE SURGERY  06/2020   Bilateral redo laminectomy/laminotomy/foraminotomies/medial facetectomy to decompress the bilateral L4 and L5 nerve roots [name])   TOTAL HIP ARTHROPLASTY Bilateral [id number]   TOTAL KNEE ARTHROPLASTY Right 03/06/2017   Procedure: RIGHT TOTAL KNEE ARTHROPLASTY;  Surgeon: Gaynelle Arabian, MD;  Location: [facility];  Service: Orthopedics;  Laterality: Right;   TRANSURETHRAL RESECTION OF BLADDER TUMOR [name] 10/19/2021   Procedure: TRANSURETHRAL RESECTION OF BLADDER TUMOR (TURBT);  Surgeon: Abbie Sons, MD;  Location: [facility];  Service: Urology;  Laterality: [name];   Patient Active Problem List   Diagnosis Date Noted   Right low back pain 12/29/2021   COPD exacerbation ([place]) 11/12/2021   Bladder cancer ([place]) 11/12/2021   Acute-on-chronic kidney injury ([place]) 07/09/2021   OSA (obstructive sleep apnea)    Acute on chronic diastolic CHF (congestive heart failure) ([place])    Right leg pain 06/22/2021   Nocturnal hypoxia 06/11/2021   CKD (chronic kidney disease) stage 3, GFR 30-59 ml/min ([place]) 02/24/2021   Peripheral neuropathy 02/23/2021   Spondylolisthesis of lumbar region 07/07/2020   COPD (chronic obstructive pulmonary disease) ([place]) 01/14/2020   Coccydynia 09/20/2019   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[place]) 07/08/2019   Anxiety 06/04/2019   Chronic right shoulder pain 09/10/2018   Cervical stenosis of  spinal canal 06/05/2017   Pedal edema 06/05/2017   Health maintenance examination 12/06/2016   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/13/2016   Overweight (BMI 25.0-29.9) 07/04/2016   Low vitamin B12 level 01/01/2016   Exertional dyspnea 07/07/2015   Medicare annual wellness visit, subsequent 07/03/2015   Advanced care planning/counseling discussion 07/03/2015   Acute recurrent frontal sinusitis 05/05/2015   Type 2 diabetes mellitus with other specified complication ([place]) 62/22/9798   Pleuritic chest pain 05/04/2015   Ex-smoker 05/04/2015   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   Spermatocele 04/02/2013   Syncopal vertigo 11/04/2010   Lumbar disc disease with radiculopathy 11/04/2010   CAD, ARTERY BYPASS GRAFT 08/11/2009   PAD (peripheral artery disease) ([place]) 08/11/2009   DUPUYTREN'S CONTRACTURE, 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   Carotid stenosis  07/09/2008   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 He was lifting something heavy and heard and felt a large pop. He did an MRI done that "showed a tear in the hamstring" but more importantly that he had bladder cancer. He went through 6 weeks of treatment for this, and is just now able to address the proximal hamstring pain. Pain has gotten better since initial injury but is still present. Pain is a dull ache that is constant. Worst pain 4/10; best 0/10. Pain is worsened by sitting, bending forward to pick things up and getting things out of his garden. Pt is retired, but enjoys gardening and wood working. Pt  reports 2 falls in past 6 months, one time tripping over a rock, tripped getting off his lawnmower but caught himself. Has some lingering SOB from having COVID in 2020, has RW and SPC at home because of this but does not use. Walking more limited by SOB symptoms, unlimited with hamstring. Standing is unlimited. Pt denies N/V, B&B changes, unexplained weight fluctuation, saddle paresthesia, fever, night sweats, or unrelenting night pain at this time.    PRECAUTIONS: none  SUBJECTIVE: Pt reports his doctor told him he is cancer free today! Is doing well currently, was sore after last visit. Compliance with HEP   PAIN:  Are you having pain? No   OBJECTIVE: (objective measures completed at initial evaluation unless otherwise dated)  DIAGNOSTIC FINDINGS:  [facility] hamstring at [facility] Jan 2022 with partial tear of prox hamstring (per pt). Several other imaging in chart   PATIENT SURVEYS:  FOTO 51 with goal of 60   SCREENING FOR RED FLAGS: Bowel or bladder incontinence: No Spinal tumors: No Cauda equina syndrome: No Compression fracture: No Abdominal aneurysm: No   COGNITION:           Overall cognitive status: Within functional limits for tasks assessed                          SENSATION: WFL   MUSCLE LENGTH: Hamstrings: Right 145d deg; Left 120deg     POSTURE: decreased full hip ext bilat with decreased lumbar lordosis; lower crossed. Forward head, rounded shoulders, increased thoracic kyphosis   PALPATION: TTP with concordant pain to palpation of proximal hamstring insertion. Latent trigger points to mid glute max fiers over piriformis. No TTP at mid hamstring group muscle belly, distal insertion, of glute musculature   LUMBAR ROM:    Active  A/PROM  eval  Flexion WNL in sitting; in standing limited by hamstrings  Extension 50% limited  Right lateral flexion    Left lateral flexion    Right rotation 50% limited  Left rotation 50% limited   (Blank rows = not tested)    LOWER EXTREMITY ROM:      Active/PROM Right eval Left eval  Hip flexion 100/100 100/100  Hip extension 0/10 0/10  Hip abduction Wnl wnl  Hip adduction wnl WNL  Hip internal rotation 25d both 20d both  Hip external rotation WNL WNL  Knee flexion 130 Both  128 Both  Knee extension WNL WNL   Ankle dorsiflexion 10d/18d 10d/18d  Ankle plantarflexion      Ankle inversion      Ankle eversion       (Blank rows = not tested)   Knee flex in elys position L: 95d R 90d   LOWER EXTREMITY MMT:     MMT Right eval Left eval  Hip flexion 5 5  Hip extension 3- 2+  Hip abduction 4- 4-  Hip adduction      Hip internal rotation 24 20  Hip external rotation WNL WNL  Knee flexion 5 4 with concordant pain  Knee extension 5 5  Ankle dorsiflexion 5 5  Ankle plantarflexion      Ankle inversion      Ankle eversion       (Blank rows = not tested)   1RM Hamstring curl R 45# L 25#   LUMBAR SPECIAL TESTS:  FABER test: Negative and   FADIR TEST: NEGATIVE BUT WITH "STRETCH SENSATION   FUNCTIONAL TESTS:  5 times sit to stand: 19SEC 10 meter walk test: self selected 0.36ms fastest 1.025m SLS R: 14sec L: 21sec Squat only to parallel before heel raise compensation is needed   GAIT:              5045fith no AD used decreased speed, decreased terminal hip ext bilat presenting with decreased step length bilat, decreased RLE stance time with shortened LLE step > RLE     TODAY'S TREATMENT  Todays Intervention  Start of session: 126/47; again 117/47 Nustep seat 10 BLEs L4 for gentle strengthening BP following (manual) 122/50 no adverse symptoms Single leg sit to stand to elevated mat (RLE heel touch for balance) 3x 8/8/6 with cuing for more reliance on LLE with good carry over Stool scoot (hamstring curl) 2x [id number] endurance demand- unable to do full scoot with LLE only- use RLE for 25% only  Standing hamstring curl with eccentric lower 3# AW 3x 10  with cuing to prevent hip flex Seated L  hamstring stretch 30sec    PATIENT EDUCATION:  Education details: therex form /technique Person educated: Patient Education method: ExplConsulting civil engineermoMedia plannerctCorporate treasurers, and Handouts Education comprehension: verbalized understanding, returned demonstration, and verbal cues required     HOME EXERCISE PROGRAM: Gentle hamstring stretch   ASSESSMENT:   CLINICAL IMPRESSION: PT continued therex progression for increased eccentric hamstring strengthening and lengthening ; with endurance demand as well, with success. Patient vitals monitored throughout session, diastolic low, but not symptomatic throughout session. Pt is able to comply with all cuing for proper technique of therex with good effort throughout session; no increased pain. PT will continue progression as able.      OBJECTIVE IMPAIRMENTS Abnormal gait, decreased activity tolerance, decreased balance, decreased cognition, decreased coordination, decreased endurance, decreased knowledge of use of DME, decreased mobility, difficulty walking, decreased ROM, decreased strength, decreased safety awareness, increased fascial restrictions, increased muscle spasms, impaired flexibility, improper body mechanics, and postural dysfunction.    ACTIVITY LIMITATIONS carrying, lifting, bending, sitting, standing, squatting, and stairs   PARTICIPATION LIMITATIONS: meal prep, cleaning, laundry, community activity, and yard work   PERSONAL FACTORS Age, Education, Past/current experiences, Time since onset of injury/illness/exacerbation, and 1 comorbidity: N  are also affecting patient's functional outcome.    REHAB POTENTIAL: Good   CLINICAL DECISION MAKING: Evolving/moderate complexity   EVALUATION COMPLEXITY: Moderate     GOALS: Goals reviewed with patient? Yes   SHORT TERM GOALS: Target date: 04/15/2022  Pt will be independent with HEP in order to improve strength and balance in order to decrease fall risk and improve function at home  and work.   Baseline: Goal status: INITIAL     LONG TERM GOALS: Target date: 05/13/2022   Pt will demonstrate 1RM hamstring curl knee flex of LLE of at least 40.5# in order to demonstrate 90% of RLE, PLOF strength to complete heavy outdoor ADLs Baseline: 03/16/22 LLE 1RM 25# Goal status: INITIAL   2.  Pt will decrease worst pain as reported on NPRS by at least 3 points in order to demonstrate clinically significant reduction in pain.   Baseline: 4/10 Goal status: INITIAL   3.  Patient will increase FOTO score to 60 to demonstrate predicted increase in functional mobility to complete ADLs   Baseline: 51 Goal status: INITIAL   4.  Pt will increase 10MWT to  at least 1.20.13 m/s in order to demonstrate clinically significant improvement in uninhibited community ambulation.    Baseline: self selected 0.78ms; fastest 1.051m Goal status: INITIAL   5. Pt will decrease 5TSTS by at least 3 seconds in order to demonstrate clinically significant improvement in LE strength   Baseline: 19sec Goal status: INITIAL                     PLAN: PT FREQUENCY: 1-2x/week   PT DURATION: 8 weeks   PLANNED INTERVENTIONS: Therapeutic exercises, Therapeutic activity, Neuromuscular re-education, Balance training, Gait training, Patient/Family education, Self Care, Joint mobilization, Joint manipulation, Dry Needling, Cryotherapy, and Moist heat.   PLAN FOR NEXT SESSION: DGI, eccentric hamstring strengthening       ChDurwin RegesPT ChDurwin RegesPT 04/21/2022, 2:40 PM

## 2022-04-22 ENCOUNTER — Encounter: Payer: Self-pay | Admitting: *Deleted

## 2022-04-22 ENCOUNTER — Other Ambulatory Visit: Payer: PPO | Admitting: Urology

## 2022-04-22 LAB — CYTOLOGY - NON PAP

## 2022-04-24 ENCOUNTER — Encounter: Payer: Self-pay | Admitting: Urology

## 2022-04-25 ENCOUNTER — Ambulatory Visit: Payer: PPO | Admitting: Physical Therapy

## 2022-04-26 ENCOUNTER — Ambulatory Visit
Admission: EM | Admit: 2022-04-26 | Discharge: 2022-04-26 | Disposition: A | Discharge status: Home / Self Care | Payer: PPO | Attending: Urgent Care | Admitting: Urgent Care

## 2022-04-26 ENCOUNTER — Encounter: Payer: Self-pay | Admitting: Emergency Medicine

## 2022-04-26 ENCOUNTER — Ambulatory Visit (INDEPENDENT_AMBULATORY_CARE_PROVIDER_SITE_OTHER): Payer: PPO

## 2022-04-26 DIAGNOSIS — R059 Cough, unspecified: Secondary | ICD-10-CM | POA: Diagnosis not present

## 2022-04-26 DIAGNOSIS — R051 Acute cough: Secondary | ICD-10-CM | POA: Diagnosis not present

## 2022-04-26 MED ORDER — NEOMYCIN-POLYMYXIN-HC 3.5-10000-1 OT SUSP: 4.0000 [drp] | Freq: Three times a day (TID) | OTIC | 0 refills | Status: DC

## 2022-04-26 MED ORDER — AMOXICILLIN-POT CLAVULANATE 875-125 MG PO TABS: 1.0000 | ORAL_TABLET | Freq: Two times a day (BID) | ORAL | 0 refills | Status: DC

## 2022-04-26 NOTE — ED Provider Notes (Signed)
Roderic Palau    CSN: [id number] Arrival date & time: 04/26/22  1237      History   Chief Complaint Chief Complaint  Patient presents with   Chest congestion    Cough    HPI [name] is a 81 y.o. male.    Cough  Presents with symptoms x3 days including chest congestion and cough productive of yellow-green sputum.  He denies shortness of breath.  Denies fever.  Endorses sternal burning pain.  States he took at home COVID test with negative results.  History of COVID, October 2020.  History of COPD, CHF.  Patient endorses adherent to TID breathing treatment with nebulizer for COPD. (Duoneb?).  Scheduled pulmonary follow-up in 2 days.  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 06/2019 Echo (in setting of COVID): EF 35-40%, mild LVH, g1 DD, glob HK; b. 09/2019 Echo: EF 50-55%, Gr1 DD; c. 03/2021 Echo: EF 50-55%, no rwma, mild LVH, GrI DD, nl RV fxn. RVSP 44.63mHg. Mild-mod dil LA. Triv MR. Mild-mod Ao sclerosis.   AAA (abdominal aortic aneurysm) (HCC)    Actinic keratosis    Anginal pain ([place]    BENIGN PROSTATIC HYPERTROPHY, WITH URINARY OBSTRUCTION 09/06/2007   CAD s/p CABG    a. 1990 s/p MI-->CABG x 2; b. 2002 s/p BMS to LCX; c. 06/2015 Cath: LM 50ost, LAD 100ost/p, 697m, D2 nl, LCX  patent stent, RCA 80p (small), LIMA->LAD nl, VG->D2 nl-->Med Rx; d. 05/2020 MV: fixed apical ant/apical defect. No signif ischemia; e. 04/2021 MV: EF 52%, no ischemia.   Carotid arterial disease ([place]   a. 08/2016 Carotid U/S: <39% bilat.   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 did not tolerate CPAP.   Osteoarthritis    PAD (peripheral artery disease) ([facility]   a. 07/2017 LE duplex: RSFA [id number] 75-24m34m-74d; c. 08/2018 Periph Angio: No signif AoIliac dzs. Mod-sev Ca2+ RSFA w/ diff dzs throughout- 3 vessel runoff. Borderline signif LSFA dzs w/ mod-sev Ca2+ vessels and 3 vessel runoff below the knee-->Med rx.   Pneumonia 07/2014   [facility] hospitalization   Pneumonia due to COVID-19 virus 06/02/2019   Pre-diabetes    Prostatitis, chronic    PSA, INCREASED 07/09/2008   Spinal stenosis of lumbar region     Patient Active Problem List   Diagnosis Date Noted   Right low back pain 12/29/2021   COPD exacerbation (HCC)[place]/31/2023   Bladder cancer (HCC)[place]/31/2023   Acute-on-chronic kidney injury (HCC)[place]/25/2022   OSA (obstructive sleep apnea)    Acute on chronic diastolic CHF (congestive heart failure) (HCC)[place]  Right leg pain 06/22/2021   Nocturnal hypoxia 06/11/2021   CKD (chronic kidney disease) stage 3, GFR 30-59 ml/min (HCC) 02/24/2021   Peripheral neuropathy 02/23/2021   Spondylolisthesis of lumbar region 07/07/2020   COPD (chronic obstructive pulmonary disease) ([place]) 01/14/2020   Coccydynia 09/20/2019   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   Anxiety 06/04/2019   Chronic right shoulder pain 09/10/2018   Cervical stenosis of spinal canal 06/05/2017   Pedal edema 06/05/2017   Health maintenance examination 12/06/2016   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/13/2016   Overweight (BMI 25.0-29.9) 07/04/2016   Low vitamin B12 level 01/01/2016   Exertional  dyspnea 07/07/2015   Medicare annual wellness visit, subsequent 07/03/2015   Advanced care planning/counseling discussion 07/03/2015   Acute recurrent frontal sinusitis 05/05/2015   Type 2 diabetes mellitus with other specified complication ([place]) 30/16/0109   Pleuritic chest pain 05/04/2015   Ex-smoker 05/04/2015   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   Spermatocele 04/02/2013   Syncopal vertigo 11/04/2010   Lumbar disc disease with radiculopathy 11/04/2010   CAD, ARTERY BYPASS GRAFT 08/11/2009   PAD (peripheral artery disease) ([place]) 08/11/2009   DUPUYTREN'S CONTRACTURE, 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   Carotid stenosis 07/09/2008   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   Benign prostatic hyperplasia with urinary obstruction 09/06/2007   Essential hypertension 04/30/2007   GERD 04/30/2007   Hyperlipidemia 04/26/2007   OA (osteoarthritis) of knee 04/26/2007   DVT, HX OF 04/26/2007    Past Surgical History:  Procedure Laterality Date   ABDOMINAL AORTOGRAM W/LOWER EXTREMITY [name] 09/12/2018   Procedure: ABDOMINAL AORTOGRAM W/LOWER EXTREMITY;  Surgeon: [name], MD;  Location: [place] [facility];  Service: Cardiovascular;  Laterality: [name];   APPENDECTOMY     rupture   BACK SURGERY     1984 and then [facility]   BLADDER INSTILLATION [name] 10/19/2021   Procedure: BLADDER INSTILLATION OF GEMCITABINE;  Surgeon: Abbie Sons, MD;  Location: [facility];  Service: Urology;  Laterality: [name];   CARDIAC CATHETERIZATION [name] 07/07/2015   Procedure: Left Heart Cath and Cors/Grafts Angiography;  Surgeon: [name], MD;  Location: [place] [facility];  Service: Cardiovascular;  Laterality: [name];   CATARACT EXTRACTION, BILATERAL  2009   CERVICAL SPINE SURGERY  12/2016   cervical stenosis [name])   COLONOSCOPY  03/2010   HP polyp, diverticulosis, rpt 10 yrs ([name])   CORONARY ANGIOPLASTY  11/30/2000   LCX   CORONARY ARTERY BYPASS GRAFT  1990   3 vessel    CYSTOSCOPY  12/23/2010   [place]    EYE SURGERY     cataract   JOINT REPLACEMENT     KNEE ARTHROSCOPY Right    x2   LAMINOTOMY  1986   L5/S1 lumbar laminotomy for two ruptured discs/fusion   LUMBAR LAMINECTOMY/DECOMPRESSION MICRODISCECTOMY [name] 07/13/2016   Procedure: LUMBAR TWO-THREE, LUMBAR THREE-FOUR, LUMBAR FOUR-FIVE LAMINECTOMY AND FORAMINOTOMY;  Surgeon: [name], MD;  Location: [place];  Service: Neurosurgery;  Laterality: [name];  LAMINECTOMY AND FORAMINOTOMY L2-L3, L3-L4,L4-L5   LUMBAR SPINE SURGERY  06/2020   Bilateral redo laminectomy/laminotomy/foraminotomies/medial facetectomy to decompress the bilateral L4 and L5 nerve roots [name])   TOTAL HIP ARTHROPLASTY Bilateral [id number]   TOTAL KNEE ARTHROPLASTY Right 03/06/2017   Procedure: RIGHT TOTAL KNEE ARTHROPLASTY;  Surgeon: Gaynelle Arabian, MD;  [facility];  Service: Orthopedics;  Laterality: Right;   TRANSURETHRAL RESECTION OF BLADDER TUMOR [name] 10/19/2021   Procedure: TRANSURETHRAL RESECTION OF BLADDER TUMOR (TURBT);  Surgeon: Abbie Sons, MD;  Location: [facility];  Service: Urology;  Laterality: [name];       Home Medications    Prior to Admission medications   Medication Sig Start Date End Date Taking? Authorizing Provider  aspirin EC 81 MG EC tablet Take 1 tablet (81 mg total) by mouth daily. 06/26/19  Yes Elgergawy, Silver Huguenin, MD  atorvastatin (LIPITOR) 20 MG tablet TAKE 1 TABLET BY MOUTH DAILY. 01/17/22  Yes Gollan, Kathlene November, MD  ezetimibe (ZETIA) 10 MG tablet TAKE 1 TABLET BY MOUTH ONCE DAILY. 04/15/22  Yes Gollan, Kathlene November, MD  furosemide (LASIX) 20 MG tablet Take 1 tablet by mouth every third day as directed. 02/14/22  Yes Gollan, Kathlene November, MD  gabapentin (NEURONTIN) 600 MG tablet Take 600 mg by mouth 3 (three) times daily.   Yes [provider]  isosorbide mononitrate (IMDUR) 30 MG 24 hr tablet Take 0.5 tablets (15 mg total) by mouth daily. 01/13/22  Yes Furth, Cadence H, PA-C  losartan (COZAAR) 25 MG tablet TAKE 1 TABLET BY MOUTH DAILY  02/03/22  Yes Gollan, Kathlene November, MD  pantoprazole (PROTONIX) 40 MG tablet Take 40 mg by mouth 2 (two) times daily. 07/16/21  Yes [provider]  ranolazine (RANEXA) 500 MG 12 hr tablet TAKE ONE (1) TABLET BY MOUTH TWO TIMES PER DAY 04/19/22  Yes Gollan, Kathlene November, MD  tolterodine (DETROL LA) 4 MG 24 hr capsule TAKE 1 CAPSULE EVERY DAY 04/19/22  Yes Stoioff, Ronda Fairly, MD  acetaminophen (TYLENOL) 500 MG tablet Take 1,000 mg by mouth every 8 (eight) hours as needed for moderate pain.    [provider]  Azelastine HCl (ASTEPRO) 0.15 % SOLN Place 1 spray into the nose at bedtime as needed (Sinus congestion, allergies). 02/03/22   Chesley Mires, MD  cyclobenzaprine (FLEXERIL) 5 MG tablet Take 1 tablet (5 mg total) by mouth 2 (two) times daily as needed for muscle spasms (sedation precautions). 12/29/21   Ria Bush, MD  docusate sodium (COLACE) 100 MG capsule Take 100 mg by mouth 2 (two) times daily as needed for mild constipation.    [provider]  fluticasone (FLONASE) 50 MCG/ACT nasal spray Place 1 spray into both nostrils as needed for allergies or rhinitis.    [provider]  ipratropium-albuterol (DUONEB) 0.5-2.5 (3) MG/3ML SOLN Take 3 mLs by nebulization every 6 (six) hours as needed. 07/06/21   Tyler Pita, MD  Nebulizers (COMPRESSOR/NEBULIZER) MISC Use as directed. 07/06/21   Tyler Pita, MD  nitroGLYCERIN (NITROSTAT) 0.4 MG SL tablet Place 1 tablet (0.4 mg total) under the tongue every 5 (five) minutes as needed for chest pain. 05/01/21   Ria Bush, MD  OVER THE COUNTER MEDICATION Apply 1 application topically 2 (two) times daily as needed (pain). Cryotherapy roll on gel    [provider]  OXYGEN Inhale 2 L into the lungs at bedtime.    [provider]  polyethylene glycol (MIRALAX / GLYCOLAX) 17 g packet Take 17 g by mouth daily as needed for mild constipation.     [provider]  PROAIR HFA 108 [phone] Base)  MCG/ACT inhaler TAKE 2 PUFFS INTO LUNGS EVERY 6 HOURS ASNEEDED FOR WHEEZING OR SHORTNESS OF BREATH 02/19/21   Ria Bush, MD  tamsulosin (FLOMAX) 0.4 MG CAPS capsule TAKE 1 CAPSULE BY MOUTH AT BEDTIME 03/15/22   Stoioff, Ronda Fairly, MD    Family History Family History  Problem Relation Age of Onset   Stroke Mother    Heart attack  Mother    Diabetes Mother    Stroke Father    Heart disease Father    Polycystic kidney disease Father    Cancer Sister        throat   Polycystic kidney disease Sister    Diabetes Brother        1/2 brother   Cancer Other        5/7 nephews with lung cancer    Social History Social History   Tobacco Use   Smoking status: Former    Packs/day: 1.00    Years: 25.00    Total pack years: 25.00    Types: Cigarettes    Quit date: 08/15/1988    Years since quitting: 33.7   Smokeless tobacco: Never  Vaping Use   Vaping Use: Never used  Substance Use Topics   Alcohol use: No    Alcohol/week: 0.0 standard drinks of alcohol   Drug use: No     Allergies   Vioxx [rofecoxib], Spiriva respimat [tiotropium bromide monohydrate], Contrast media [iodinated contrast media], and Morphine   Review of Systems Review of Systems  Respiratory:  Positive for cough.      Physical Exam Triage Vital Signs ED Triage Vitals [04/26/22 1250]  Enc Vitals Group     BP 124/60     Pulse Rate 82     Resp 18     Temp 98.1 F (36.7 C)     Temp Source Oral     SpO2 96 %     Weight      Height      Head Circumference      Peak Flow      Pain Score      Pain Loc      Pain Edu?      Excl. in [place]?    No data found.  Updated Vital Signs BP 124/60 (BP Location: Left Arm)   Pulse 82   Temp 98.1 F (36.7 C) (Oral)   Resp 18   SpO2 96%   Visual Acuity Right Eye Distance:   Left Eye Distance:   Bilateral Distance:    Right Eye Near:   Left Eye Near:    Bilateral Near:     Physical Exam Vitals reviewed.  Constitutional:      Appearance: Normal  appearance.  Cardiovascular:     Rate and Rhythm: Normal rate and regular rhythm.     Pulses: Normal pulses.     Heart sounds: Normal heart sounds.  Pulmonary:     Effort: Pulmonary effort is normal.     Breath sounds: Normal breath sounds.  Skin:    General: Skin is warm and dry.  Neurological:     General: No focal deficit present.     Mental Status: He is alert and oriented to person, place, and time.  Psychiatric:        Mood and Affect: Mood normal.        Behavior: Behavior normal.      UC Treatments / Results  Labs (all labs ordered are listed, but only abnormal results are displayed) Labs Reviewed - No data to display  EKG   Radiology No results found.  Procedures Procedures (including critical care time)  Medications Ordered in UC Medications - No data to display  Initial Impression / Assessment and Plan / UC Course  I have reviewed the triage vital signs and the nursing notes.  Pertinent labs & imaging results that were available during my care of  the patient were reviewed by me and considered in my medical decision making (see chart for details).     Exam unremarkable.  Lungs CTAB without adventitious breath sounds.  Will order chest imaging given his significant comorbidities and complaint of chest pain.  Likely viral etiology given duration of symptoms.  Continue your breathing treatments per pulmonary.  You may continue to use OTC medication for control of your respiratory symptoms.  Follow-up with your primary care provider and pulmonary provider as scheduled.  Final Clinical Impressions(s) / UC Diagnoses   Final diagnoses:  None   Discharge Instructions   None    ED Prescriptions   None    PDMP not reviewed this encounter.   Rose Phi, [place] 04/26/22 1323

## 2022-04-26 NOTE — Discharge Instructions (Signed)
Follow-up with your primary care provider and pulmonary provider.

## 2022-04-26 NOTE — ED Triage Notes (Signed)
Patient c/o chest congestion and cough x 3 days.   Patient denies SOB. Patient denies fever.   Patient endorses productive cough w/ " yellow green" sputum.   Patient took an at home COVID test with negative results.   History of CHF.   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    History of COPD. History of Pneumonia.

## 2022-04-28 ENCOUNTER — Encounter: Payer: Self-pay | Admitting: Pulmonary Disease

## 2022-04-28 ENCOUNTER — Telehealth: Payer: Self-pay

## 2022-04-28 ENCOUNTER — Ambulatory Visit: Payer: PPO | Admitting: Physical Therapy

## 2022-04-28 ENCOUNTER — Ambulatory Visit: Payer: PPO | Admitting: Pulmonary Disease

## 2022-04-28 VITALS — BP 114/50 | HR 76 | Temp 97.9°F | Ht 72.0 in | Wt 211.6 lb

## 2022-04-28 DIAGNOSIS — J209 Acute bronchitis, unspecified: Secondary | ICD-10-CM

## 2022-04-28 DIAGNOSIS — J45901 Unspecified asthma with (acute) exacerbation: Secondary | ICD-10-CM | POA: Diagnosis not present

## 2022-04-28 DIAGNOSIS — G4733 Obstructive sleep apnea (adult) (pediatric): Secondary | ICD-10-CM

## 2022-04-28 DIAGNOSIS — G4736 Sleep related hypoventilation in conditions classified elsewhere: Secondary | ICD-10-CM

## 2022-04-28 DIAGNOSIS — I2729 Other secondary pulmonary hypertension: Secondary | ICD-10-CM | POA: Diagnosis not present

## 2022-04-28 MED ORDER — DOXYCYCLINE HYCLATE 100 MG PO TABS: 100.0000 mg | ORAL_TABLET | Freq: Two times a day (BID) | ORAL | 0 refills | Status: DC

## 2022-04-28 NOTE — Progress Notes (Signed)
Subjective:    Patient ID: [name], male    DOB: 09-18-1940, 81 y.o.   MRN: [id number] Patient Care Team: Ria Bush, MD as PCP - General (Family Medicine) Rockey Situ Kathlene November, MD as PCP - Cardiology (Cardiology) Debbora Dus, [facility] as Pharmacist (Pharmacist) [name], MD as Consulting Physician (Cardiology) Tyler Pita, MD as Consulting Physician (Pulmonary Disease)  Chief Complaint  Patient presents with   Follow-up    HPI Patient is an 81 year old former smoker (57 PY) who presents for follow-up on the issue of dyspnea, asthmatic bronchitis and obstructive sleep apnea.  Patient's course has been complicated by prior infection with COVID-19 in October 2020 required 2 hospitalizations due to relapsing symptoms.  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  In the past he was given a trial of Trelegy but he admits that he really did not try it.  He presents today for a scheduled follow-up visit.  He has noted that over the last 6 to 7 days he has had increased cough productive of yellow/green sputum and "a rattle" in his chest.  He has been using DuoNebs as needed.  He has been compliant with his CPAP his compliance report.  Generalized malaise but no fevers, chills or sweats.  Does not endorse any other symptomatology.  He did go to urgent care but did not receive any antibiotics.  Compliance report shows 100% usage of the device 30 out of 30 days with 29 days over 4 hours of use which is 97%.  AHI down to 3.3.  He is currently on an AutoSet at 5 to 15 cm H2O.  Previously we had requested an overnight oximetry on his CPAP as he would like to stop bleeding and O2 and to the device.  We need to verify that he can maintain oxygen saturations without oxygen.  This was explained to him.  Review of Systems A 10 point review of systems was performed and it is as noted above otherwise negative.  Patient Active Problem List   Diagnosis  Date Noted   Right low back pain 12/29/2021   COPD exacerbation ([place]) 11/12/2021   Bladder cancer ([place]) 11/12/2021   Acute-on-chronic kidney injury ([place]) 07/09/2021   OSA (obstructive sleep apnea)    Acute on chronic diastolic CHF (congestive heart failure) ([place])    Right leg pain 06/22/2021   Nocturnal hypoxia 06/11/2021   CKD (chronic kidney disease) stage 3, GFR 30-59 ml/min ([place]) 02/24/2021   Peripheral neuropathy 02/23/2021   Spondylolisthesis of lumbar region 07/07/2020   COPD (chronic obstructive pulmonary disease) ([place]) 01/14/2020   Coccydynia 09/20/2019   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[place]) 07/08/2019   Anxiety 06/04/2019   Chronic right shoulder pain 09/10/2018   Cervical stenosis of spinal canal 06/05/2017   Pedal edema 06/05/2017   Health maintenance examination 12/06/2016   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/13/2016   Overweight (BMI 25.0-29.9) 07/04/2016   Low vitamin B12 level 01/01/2016   Exertional dyspnea 07/07/2015   Medicare annual wellness visit, subsequent 07/03/2015   Advanced care planning/counseling discussion 07/03/2015   Acute recurrent frontal sinusitis 05/05/2015   Type 2 diabetes mellitus with other specified complication ([place]) [id number]   Pleuritic chest pain 05/04/2015   Ex-smoker 05/04/2015   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   Spermatocele 04/02/2013   Syncopal vertigo 11/04/2010   Lumbar disc disease with radiculopathy 11/04/2010   CAD, ARTERY BYPASS GRAFT 08/11/2009   PAD (peripheral artery disease) ([place]) 08/11/2009   DUPUYTREN'S  CONTRACTURE, 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   Carotid stenosis 07/09/2008   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   Benign prostatic hyperplasia with urinary obstruction 09/06/2007   Essential hypertension 04/30/2007   GERD 04/30/2007   Hyperlipidemia 04/26/2007   OA (osteoarthritis) of knee 04/26/2007   DVT, HX OF 04/26/2007   Social History   Tobacco Use   Smoking status: Former    Packs/day: 1.00     Years: 25.00    Total pack years: 25.00    Types: Cigarettes    Quit date: 08/15/1988    Years since quitting: 33.7   Smokeless tobacco: Never  Substance Use Topics   Alcohol use: No    Alcohol/week: 0.0 standard drinks of alcohol   Allergies  Allergen Reactions   Vioxx [Rofecoxib] Other (See Comments)    Hemorrhage    Spiriva Respimat [Tiotropium Bromide Monohydrate] Other (See Comments)    Elevated bp   Contrast Media [Iodinated Contrast Media] Itching and Rash    Delayed reaction post abdominal aortagram.    Morphine Nausea Only and Other (See Comments)    Irritability    Current Meds  Medication Sig   acetaminophen (TYLENOL) 500 MG tablet Take 1,000 mg by mouth every 8 (eight) hours as needed for moderate pain.   aspirin EC 81 MG EC tablet Take 1 tablet (81 mg total) by mouth daily.   atorvastatin (LIPITOR) 20 MG tablet TAKE 1 TABLET BY MOUTH DAILY.   Azelastine HCl (ASTEPRO) 0.15 % SOLN Place 1 spray into the nose at bedtime as needed (Sinus congestion, allergies).   cyclobenzaprine (FLEXERIL) 5 MG tablet Take 1 tablet (5 mg total) by mouth 2 (two) times daily as needed for muscle spasms (sedation precautions).   docusate sodium (COLACE) 100 MG capsule Take 100 mg by mouth 2 (two) times daily as needed for mild constipation.   ezetimibe (ZETIA) 10 MG tablet TAKE 1 TABLET BY MOUTH ONCE DAILY.   fluticasone (FLONASE) 50 MCG/ACT nasal spray Place 1 spray into both nostrils as needed for allergies or rhinitis.   furosemide (LASIX) 20 MG tablet Take 1 tablet by mouth every third day as directed.   gabapentin (NEURONTIN) 600 MG tablet Take 600 mg by mouth 3 (three) times daily.   ipratropium-albuterol (DUONEB) 0.5-2.5 (3) MG/3ML SOLN Take 3 mLs by nebulization every 6 (six) hours as needed.   isosorbide mononitrate (IMDUR) 30 MG 24 hr tablet Take 0.5 tablets (15 mg total) by mouth daily.   losartan (COZAAR) 25 MG tablet TAKE 1 TABLET BY MOUTH DAILY   Nebulizers  (COMPRESSOR/NEBULIZER) MISC Use as directed.   nitroGLYCERIN (NITROSTAT) 0.4 MG SL tablet Place 1 tablet (0.4 mg total) under the tongue every 5 (five) minutes as needed for chest pain.   OVER THE COUNTER MEDICATION Apply 1 application topically 2 (two) times daily as needed (pain). Cryotherapy roll on gel   OXYGEN Inhale 2 L into the lungs at bedtime.   pantoprazole (PROTONIX) 40 MG tablet Take 40 mg by mouth 2 (two) times daily.   polyethylene glycol (MIRALAX / GLYCOLAX) 17 g packet Take 17 g by mouth daily as needed for mild constipation.    PROAIR HFA 108 (90 Base) MCG/ACT inhaler TAKE 2 PUFFS INTO LUNGS EVERY 6 HOURS ASNEEDED FOR WHEEZING OR SHORTNESS OF BREATH   ranolazine (RANEXA) 500 MG 12 hr tablet TAKE ONE (1) TABLET BY MOUTH TWO TIMES PER DAY   tamsulosin (FLOMAX) 0.4 MG CAPS capsule TAKE 1 CAPSULE BY MOUTH AT BEDTIME   tolterodine (DETROL LA) 4  MG 24 hr capsule TAKE 1 CAPSULE EVERY DAY   Immunization History  Administered Date(s) Administered   Fluad Quad(high Dose 65+) 04/17/2019   Influenza Whole 08/15/2000, 07/04/2007, 05/09/2008, 05/31/2010   Influenza, High Dose Seasonal PF 05/15/2013, 06/25/2015, 07/05/2021   Influenza,inj,Quad PF,6+ Mos 04/17/2014, 05/18/2016, 06/07/2018   Influenza-Unspecified 05/22/2017, 05/28/2020   PFIZER(Purple Top)SARS-COV-2 Vaccination 09/07/2019, 09/28/2019, 05/12/2020, 01/19/2021   Pneumococcal Conjugate-13 02/09/2015   Pneumococcal Polysaccharide-23 08/15/2000, 10/29/2008   Td 08/15/2005   Tdap 02/10/2018   Zoster Recombinat (Shingrix) 04/05/2017, 07/25/2017   Zoster, Live 02/04/2009       Objective:   Physical Exam BP (!) 114/50 (BP Location: Right Arm, Patient Position: Sitting, Cuff Size: Normal)   Pulse 76   Temp 97.9 F (36.6 C) (Oral)   Ht 6' (1.829 m)   Wt 211 lb 9.6 oz (96 kg)   SpO2 99%   BMI 28.70 kg/m  GENERAL: This is a well-developed, overweight gentleman, chronically ill-appearing, he is awake, alert, no acute  distress.  Fully ambulatory. HEAD: Normocephalic, atraumatic.  EYES: Pupils equal, round, reactive to light.  No scleral icterus.  MOUTH: Nose/mouth/throat not examined due to masking requirements for COVID 19. NECK: Supple. No thyromegaly. Trachea midline. No JVD.  No adenopathy. PULMONARY: Good air entry bilaterally, scattered rhonchi throughout, no wheezes.  CARDIOVASCULAR: S1 and S2. Regular rate and rhythm.  Grade 1/6 systolic ejection murmur left sternal border. GASTROINTESTINAL: Protuberant abdomen, soft, no tenderness.   MUSCULOSKELETAL: No joint deformity, no clubbing, no edema.  NEUROLOGIC: No overt focal deficits noted.  Speech is fluent.   SKIN: Intact,warm,dry.  Limited exam shows no rashes.  PSYCH: Mood and behavior normal.     Assessment & Plan:     ICD-10-CM   1. Asthmatic bronchitis with acute exacerbation, unspecified asthma severity, unspecified whether persistent  J45.901    Instructed to use DuoNebs 4 times a day during acute exacerbation Further treatment as below    2. Acute bronchitis, unspecified organism  J20.9    Doxycycline 100 mg p.o. twice daily x 7 days Precautions with sun exposure given    3. 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 OSA (obstructive sleep apnea)  G47.33    Compliant with CPAP Will check night oximetry on CPAP     Meds ordered this encounter  Medications   doxycycline (VIBRA-TABS) 100 MG tablet    Sig: Take 1 tablet (100 mg total) by mouth 2 (two) times daily.    Dispense:  14 tablet    Refill:  0   Will see the patient in follow-up in 6 to 8 weeks time he is to contact us prior to that time should any new difficulties arise.  Renold Don, MD Advanced Bronchoscopy PCCM [facility]    *This note was dictated using voice recognition software/Dragon.  Despite best efforts to  proofread, errors can occur which can change the meaning. Any transcriptional errors that result from this process are unintentional and may not be fully corrected at the time of dictation.

## 2022-04-28 NOTE — Telephone Encounter (Signed)
Pt scheduled for 3 mBCG appts, pt confirmed.

## 2022-04-28 NOTE — Patient Instructions (Signed)
You have an acute bronchitis.  We are treating you with an antibiotic called doxycycline 100 mg (1 tablet) twice a day for 7 days.  Make sure that if you go outside in the sun you wear sunscreen and sun protection as the antibiotic can cause redness in the skin if in contact with direct sun.  We are going to check your oxygen level at nighttime.  This will be scheduled through [employer].  Make sure that the night of the test you wear your CPAP but do not bleed in the oxygen.  We will see you in follow-up in 6 to 8 weeks time call sooner should any new problems arise.

## 2022-04-28 NOTE — Telephone Encounter (Signed)
-----   Message from Abbie Sons, MD sent at 04/22/2022  2:22 PM EDT ----- Regarding: Maintenance BCG Hi Datra Clary, please schedule this patient for 3-week maintenance BCG.  He can start anytime you have availability.  Thanks

## 2022-04-29 ENCOUNTER — Other Ambulatory Visit: Payer: Self-pay | Admitting: Cardiovascular Disease

## 2022-05-02 ENCOUNTER — Ambulatory Visit: Payer: PPO | Admitting: Physical Therapy

## 2022-05-05 ENCOUNTER — Encounter: Payer: Self-pay | Admitting: Physical Therapy

## 2022-05-05 ENCOUNTER — Ambulatory Visit: Payer: PPO | Admitting: Physical Therapy

## 2022-05-05 DIAGNOSIS — M25552 Pain in left hip: Secondary | ICD-10-CM | POA: Diagnosis not present

## 2022-05-05 DIAGNOSIS — M25551 Pain in right hip: Secondary | ICD-10-CM

## 2022-05-05 NOTE — Therapy (Signed)
OUTPATIENT PHYSICAL THERAPY SCREEN   Patient Name: [name] MRN: [id number] DOB:1940/12/27, 81 y.o., male Today's Date: 05/05/2022  PCP: Ria Bush MD REFERRING PROVIDER: Gerrit Halls PA  END OF SESSION:   PT End of Session - 05/05/22 1306     Visit Number 8    Number of Visits 17    Date for PT Re-Evaluation 05/18/22    Authorization Type 7/10 Medicare    Authorization - Visit Number 8    Authorization - Number of Visits 10    PT Start Time 1245    PT Stop Time 1345    PT Time Calculation (min) 41 min    Activity Tolerance Treatment limited secondary to medical complications (Comment)    Behavior During Therapy [facility] for tasks assessed/performed                    Past Medical History:  Diagnosis Date   (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 06/2019 Echo (in setting of COVID): EF 35-40%, mild LVH, g1 DD, glob HK; b. 09/2019 Echo: EF 50-55%, Gr1 DD; c. 03/2021 Echo: EF 50-55%, no rwma, mild LVH, GrI DD, nl RV fxn. RVSP 44.28mHg. Mild-mod dil LA. Triv MR. Mild-mod Ao sclerosis.   AAA (abdominal aortic aneurysm) (HCC)    Actinic keratosis    Anginal pain ([place]    BENIGN PROSTATIC HYPERTROPHY, WITH URINARY OBSTRUCTION 09/06/2007   CAD s/p CABG    a. 1990 s/p MI-->CABG x 2; b. 2002 s/p BMS to LCX; c. 06/2015 Cath: LM 50ost, LAD 100ost/p, 62m, D2 nl, LCX  patent stent, RCA 80p (small), LIMA->LAD nl, VG->D2 nl-->Med Rx; d. 05/2020 MV: fixed apical ant/apical defect. No signif ischemia; e. 04/2021 MV: EF 52%, no ischemia.   Carotid arterial disease ([facility]   a. 08/2016 Carotid U/S: <39% bilat.   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   CKD (chronic kidney disease), stage III ([facility]   Community acquired pneumonia of right lower lobe of lung 06/05/2017   COVID-19 virus infection 05/30/2019   Covid PNA with hospitalization 05/2019   DUPUYTREN'S CONTRACTURE, 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   DVT, HX OF 1998    Emphysema of lung ([facility]   GERD 04/30/2007   Headache    hx migraines   History of hiatal hernia    History of shingles    HYPERLIPIDEMIA 04/26/2007   HYPERTENSION 04/30/2007   INGUINAL HERNIA, RIGHT 05/26/2010   Laceration of skin of left hand 03/08/2018   Lumbar disc disease with radiculopathy    neuropathy in feet   Myocardial infarction ([facility]   1989, 2002   OSA (obstructive sleep apnea)    a. did not tolerate CPAP.   Osteoarthritis    PAD (peripheral artery disease) ([facility]   a. 07/2017 LE duplex: RSFA [id number] 75-18m40m-74d; c. 08/2018 Periph Angio: No signif AoIliac dzs. Mod-sev Ca2+ RSFA w/ diff dzs throughout- 3 vessel runoff. Borderline signif LSFA dzs w/ mod-sev Ca2+ vessels and 3 vessel runoff below the knee-->Med rx.   Pneumonia 07/2014   [facility] hospitalization   Pneumonia due to COVID-19 virus 06/02/2019   Pre-diabetes    Prostatitis, chronic    PSA, INCREASED 07/09/2008   Spinal stenosis of lumbar region    Past Surgical History:  Procedure Laterality Date   ABDOMINAL AORTOGRAM W/LOWER EXTREMITY [name] 09/12/2018   Procedure: ABDOMINAL AORTOGRAM W/LOWER EXTREMITY;  Surgeon: AridWellington Hampshire;  Location:  [place] [facility];  Service: Cardiovascular;  Laterality: [name];   APPENDECTOMY     rupture   BACK SURGERY     1984 and then [facility]   BLADDER INSTILLATION [name] 10/19/2021   Procedure: BLADDER INSTILLATION OF GEMCITABINE;  Surgeon: Abbie Sons, MD;  Location: [facility] ORS;  Service: Urology;  Laterality: [name];   CARDIAC CATHETERIZATION [name] 07/07/2015   Procedure: Left Heart Cath and Cors/Grafts Angiography;  Surgeon: [name], MD;  Location: [facility] [facility];  Service: Cardiovascular;  Laterality: [name];   CATARACT EXTRACTION, BILATERAL  2009   CERVICAL SPINE SURGERY  12/2016   cervical stenosis [name])   COLONOSCOPY  03/2010   HP polyp, diverticulosis, rpt 10 yrs ([name])   CORONARY ANGIOPLASTY  11/30/2000   LCX   CORONARY ARTERY  BYPASS GRAFT  1990   3 vessel    CYSTOSCOPY  12/23/2010   [facility]   EYE SURGERY     cataract   JOINT REPLACEMENT     KNEE ARTHROSCOPY Right    x2   LAMINOTOMY  1986   L5/S1 lumbar laminotomy for two ruptured discs/fusion   LUMBAR LAMINECTOMY/DECOMPRESSION MICRODISCECTOMY [name] 07/13/2016   Procedure: LUMBAR TWO-THREE, LUMBAR THREE-FOUR, LUMBAR FOUR-FIVE LAMINECTOMY AND FORAMINOTOMY;  Surgeon: [name], MD;  Location: [facility];  Service: Neurosurgery;  Laterality: [name];  LAMINECTOMY AND FORAMINOTOMY L2-L3, L3-L4,L4-L5   LUMBAR SPINE SURGERY  06/2020   Bilateral redo laminectomy/laminotomy/foraminotomies/medial facetectomy to decompress the bilateral L4 and L5 nerve roots [name])   TOTAL HIP ARTHROPLASTY Bilateral 7510,2585   TOTAL KNEE ARTHROPLASTY Right 03/06/2017   Procedure: RIGHT TOTAL KNEE ARTHROPLASTY;  Surgeon: Gaynelle Arabian, MD;  Location: [facility];  Service: Orthopedics;  Laterality: Right;   TRANSURETHRAL RESECTION OF BLADDER TUMOR [name] 10/19/2021   Procedure: TRANSURETHRAL RESECTION OF BLADDER TUMOR (TURBT);  Surgeon: Abbie Sons, MD;  Location: [facility] ORS;  Service: Urology;  Laterality: [name];   Patient Active Problem List   Diagnosis Date Noted   Right low back pain 12/29/2021   COPD exacerbation ([place]) 11/12/2021   Bladder cancer ([place]) 11/12/2021   Acute-on-chronic kidney injury ([place]) 07/09/2021   OSA (obstructive sleep apnea)    Acute on chronic diastolic CHF (congestive heart failure) ([place])    Right leg pain 06/22/2021   Nocturnal hypoxia 06/11/2021   CKD (chronic kidney disease) stage 3, GFR 30-59 ml/min ([place]) 02/24/2021   Peripheral neuropathy 02/23/2021   Spondylolisthesis of lumbar region 07/07/2020   COPD (chronic obstructive pulmonary disease) ([place]) 01/14/2020   Coccydynia 09/20/2019   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[place]) 07/08/2019   Anxiety 06/04/2019   Chronic right shoulder pain 09/10/2018   Cervical stenosis of spinal canal 06/05/2017   Pedal edema  06/05/2017   Health maintenance examination 12/06/2016   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/13/2016   Overweight (BMI 25.0-29.9) 07/04/2016   Low vitamin B12 level 01/01/2016   Exertional dyspnea 07/07/2015   Medicare annual wellness visit, subsequent 07/03/2015   Advanced care planning/counseling discussion 07/03/2015   Acute recurrent frontal sinusitis 05/05/2015   Type 2 diabetes mellitus with other specified complication ([place]) 27/78/2423   Pleuritic chest pain 05/04/2015   Ex-smoker 05/04/2015   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   Spermatocele 04/02/2013   Syncopal vertigo 11/04/2010   Lumbar disc disease with radiculopathy 11/04/2010   CAD, ARTERY BYPASS GRAFT 08/11/2009   PAD (peripheral artery disease) ([place]) 08/11/2009   DUPUYTREN'S CONTRACTURE, 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   Carotid stenosis 07/09/2008   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 He was lifting something heavy and heard and felt a large pop. He did an MRI done that "showed a tear in the hamstring" but more importantly that he had bladder cancer. He went through 6 weeks of treatment for this, and is just now able to address the proximal hamstring pain. Pain has gotten better since initial injury but is still present. Pain is a dull ache that is constant. Worst pain 4/10; best 0/10. Pain is worsened by sitting, bending forward to pick things up and getting things out of his garden. Pt is retired, but enjoys gardening and wood working. Pt reports 2 falls in past 6 months, one time  tripping over a rock, tripped getting off his lawnmower but caught himself. Has some lingering SOB from having COVID in 2020, has RW and SPC at home because of this but does not use. Walking more limited by SOB symptoms, unlimited with hamstring. Standing is unlimited. Pt denies N/V, B&B changes, unexplained weight fluctuation, saddle paresthesia, fever, night sweats, or unrelenting night pain at this time.    PRECAUTIONS: none  SUBJECTIVE: Pt reports he has been sick with bronchitis, finishing antibiotic round for this today. Pt reports he has just been feeling "weak". Min compliance with HEP. Reports no hamstring pain   PAIN:  Are you having pain? No   OBJECTIVE: (objective measures completed at initial evaluation unless otherwise dated)  DIAGNOSTIC FINDINGS:  MRI hamstring at [facility] Jan 2022 with partial tear of prox hamstring (per pt). Several other imaging in chart   PATIENT SURVEYS:  FOTO 51 with goal of 60   SCREENING FOR RED FLAGS: Bowel or bladder incontinence: No Spinal tumors: No Cauda equina syndrome: No Compression fracture: No Abdominal aneurysm: No   COGNITION:           Overall cognitive status: Within functional limits for tasks assessed                          SENSATION: WFL   MUSCLE LENGTH: Hamstrings: Right 145d deg; Left 120deg     POSTURE: decreased full hip ext bilat with decreased lumbar lordosis; lower crossed. Forward head, rounded shoulders, increased thoracic kyphosis   PALPATION: TTP with concordant pain to palpation of proximal hamstring insertion. Latent trigger points to mid glute max fiers over piriformis. No TTP at mid hamstring group muscle belly, distal insertion, of glute musculature   LUMBAR ROM:    Active  A/PROM  eval  Flexion WNL in sitting; in standing limited by hamstrings  Extension 50% limited  Right lateral flexion    Left lateral flexion    Right rotation 50% limited  Left rotation 50% limited   (Blank rows = not  tested)   LOWER EXTREMITY ROM:      Active/PROM Right eval Left eval  Hip flexion 100/100 100/100  Hip extension 0/10 0/10  Hip abduction Wnl wnl  Hip adduction wnl WNL  Hip internal rotation 25d both 20d both  Hip external rotation WNL WNL  Knee flexion 130 Both 128  Both  Knee extension WNL WNL   Ankle dorsiflexion 10d/18d 10d/18d  Ankle plantarflexion      Ankle inversion      Ankle eversion       (Blank rows = not tested)   Knee flex in elys position L: 95d R 90d   LOWER EXTREMITY MMT:     MMT Right eval Left eval  Hip flexion 5 5  Hip extension 3- 2+  Hip abduction 4- 4-  Hip adduction      Hip internal rotation 24 20  Hip external rotation WNL WNL  Knee flexion 5 4 with concordant pain  Knee extension 5 5  Ankle dorsiflexion 5 5  Ankle plantarflexion      Ankle inversion      Ankle eversion       (Blank rows = not tested)   1RM Hamstring curl R 45# L 25#   LUMBAR SPECIAL TESTS:  FABER test: Negative and   FADIR TEST: NEGATIVE BUT WITH "STRETCH SENSATION   FUNCTIONAL TESTS:  5 times sit to stand: 19SEC 10 meter walk test: self selected 0.73ms fastest 1.04m SLS R: 14sec L: 21sec Squat only to parallel before heel raise compensation is needed   GAIT:              5051fith no AD used decreased speed, decreased terminal hip ext bilat presenting with decreased step length bilat, decreased RLE stance time with shortened LLE step > RLE     TODAY'S TREATMENT  Todays Intervention  Start of session: 127/54;  Nustep seat 10 BLEs L4 for gentle strengthening BP following:  115/53 no adverse symptoms Single leg sit to stand to elevated mat (RLE heel touch for balance) 3x 5 with increased mat height from last session Omega straight leg deadlift 20# 2x 8 with good carry over following demo for technique Stool scoot (hamstring curl) 2x [id number] endurance demand- unable to do full scoot with LLE only- use RLE for 25% only  Standing hamstring curl with eccentric  lower 3# AW 3x 10  with cuing to prevent hip flex Seated L hamstring stretch 30sec    PATIENT EDUCATION:  Education details: therex form /technique Person educated: Patient Education method: ExplConsulting civil engineermoMedia plannerctCorporate treasurers, and Handouts Education comprehension: verbalized understanding, returned demonstration, and verbal cues required     HOME EXERCISE PROGRAM: Gentle hamstring stretch   ASSESSMENT:   CLINICAL IMPRESSION: PT continued therex progression for increased eccentric hamstring strengthening and lengthening ; with endurance demand as well, with success. Pt with increased need for rest breaks needed for bronchitis with ability to complete planned therex despite this.  Patient vitals monitored throughout session, diastolic low, but not symptomatic throughout session. Pt is able to comply with all cuing for proper technique of therex with good effort throughout session; no increased pain. PT will continue progression as able.      OBJECTIVE IMPAIRMENTS Abnormal gait, decreased activity tolerance, decreased balance, decreased cognition, decreased coordination, decreased endurance, decreased knowledge of use of DME, decreased mobility, difficulty walking, decreased ROM, decreased strength, decreased safety awareness, increased fascial restrictions, increased muscle spasms, impaired flexibility, improper body mechanics, and postural dysfunction.    ACTIVITY LIMITATIONS carrying, lifting, bending, sitting, standing, squatting, and stairs   PARTICIPATION LIMITATIONS: meal prep, cleaning, laundry, community activity, and yard work   PERSONAL FACTORS Age, Education, Past/current experiences, Time since onset of injury/illness/exacerbation, and 1 comorbidity: N  are also affecting patient's functional outcome.    REHAB POTENTIAL: Good  CLINICAL DECISION MAKING: Evolving/moderate complexity   EVALUATION COMPLEXITY: Moderate     GOALS: Goals reviewed with patient? Yes    SHORT TERM GOALS: Target date: 04/15/2022   Pt will be independent with HEP in order to improve strength and balance in order to decrease fall risk and improve function at home and work.   Baseline: Goal status: INITIAL     LONG TERM GOALS: Target date: 05/13/2022   Pt will demonstrate 1RM hamstring curl knee flex of LLE of at least 40.5# in order to demonstrate 90% of RLE, PLOF strength to complete heavy outdoor ADLs Baseline: 03/16/22 LLE 1RM 25# Goal status: INITIAL   2.  Pt will decrease worst pain as reported on NPRS by at least 3 points in order to demonstrate clinically significant reduction in pain.   Baseline: 4/10 Goal status: INITIAL   3.  Patient will increase FOTO score to 60 to demonstrate predicted increase in functional mobility to complete ADLs   Baseline: 51 Goal status: INITIAL   4.  Pt will increase 10MWT to  at least 1.20.13 m/s in order to demonstrate clinically significant improvement in uninhibited community ambulation.    Baseline: self selected 0.62ms; fastest 1.069m Goal status: INITIAL   5. Pt will decrease 5TSTS by at least 3 seconds in order to demonstrate clinically significant improvement in LE strength   Baseline: 19sec Goal status: INITIAL                     PLAN: PT FREQUENCY: 1-2x/week   PT DURATION: 8 weeks   PLANNED INTERVENTIONS: Therapeutic exercises, Therapeutic activity, Neuromuscular re-education, Balance training, Gait training, Patient/Family education, Self Care, Joint mobilization, Joint manipulation, Dry Needling, Cryotherapy, and Moist heat.   PLAN FOR NEXT SESSION: DGI, eccentric hamstring strengthening       ChDurwin RegesPT ChDurwin RegesPT 05/05/2022, 1:45 PM

## 2022-05-06 ENCOUNTER — Ambulatory Visit: Payer: PPO | Admitting: Physician Assistant

## 2022-05-06 ENCOUNTER — Encounter: Payer: Self-pay | Admitting: Physician Assistant

## 2022-05-06 VITALS — BP 103/63 | HR 82 | Ht 72.0 in | Wt 209.0 lb

## 2022-05-06 DIAGNOSIS — C679 Malignant neoplasm of bladder, unspecified: Secondary | ICD-10-CM

## 2022-05-06 LAB — MICROSCOPIC EXAMINATION: Bacteria, UA: NONE SEEN

## 2022-05-06 LAB — URINALYSIS, COMPLETE
Bilirubin, UA: NEGATIVE
Glucose, UA: NEGATIVE
Ketones, UA: NEGATIVE
Leukocytes,UA: NEGATIVE
Nitrite, UA: NEGATIVE
Protein,UA: NEGATIVE
Specific Gravity, UA: 1.01 (ref 1.005–1.030)
Urobilinogen, Ur: 0.2 mg/dL (ref 0.2–1.0)
pH, UA: 5 (ref 5.0–7.5)

## 2022-05-06 MED ORDER — BCG LIVE 50 MG IS SUSR
3.2400 mL | Freq: Once | INTRAVESICAL | Status: AC
  Administered 2022-05-06: 81 mg via INTRAVESICAL

## 2022-05-06 NOTE — Progress Notes (Signed)
BCG Bladder Instillation  BCG # 1 of 3  Due to Bladder Cancer patient is present today for a BCG treatment. Patient was cleaned and prepped in a sterile fashion with betadine. A 14FR catheter was inserted, urine return was noted 174m, urine was yellow in color.  51mof reconstituted BCG was instilled into the bladder. The catheter was then removed. Patient tolerated well, no complications were noted  Performed by: SaDebroah LoopPA-C and ShElizabeth PalauCMA  Additional notes: Reviewed post-instillation instructions regarding turns and bleach; patient recalls these and declines written instructions.  Follow up/ Additional notes: 1 week for BCG #2 of 3

## 2022-05-06 NOTE — Patient Instructions (Signed)
Patient Education: (BCG) Into the Bladder (Intravesical Chemotherapy)  BCG is a vaccine which is used to prevent tuberculosis (TB).  But it's also a helpful treatment for some early bladder cancers.  When BCG goes directly into the bladder the treatment is described as intravesical.  BCG is a type of immunotherapy.  Immunotherapy stimulates the body's immune system to destroy cancer cells.  How it's given BCG treatment is given to you in an outpatient setting.  It takes a few minutes to administer and you can go home as soon as it's finished.  It might be a good idea to ask someone to bring you, particularly the fist time.  Unlike chemotherapy into the bladder, BCG treatment is never given immediately after surgery to remove bladder tumors.  There needs to be a delay usually of at least two weeks after surgery, before you can have it.  You won't be given treatment with BCG if you are unwell or have an infection in your urine.  You're usually asked to limit the amount you drink before your treatment.  This will help to increase the concentration of BCG in your bladder.  Drinking too much before your treatment may make your bladder feel uncomfortably full.  If you normally take water tablets (diuretics) take them later in the day after your treatment.  Your nurse or doctor will give you more advise about preparing for your treatment.  You will have a small tube (catheter) placed into your bladder.  Your doctor will then put the liquid vaccine directly into your bladder through the catheter and remove the catheter.  You will need to hold your urine for two hours afterwards.  Rotating every 15 minutes from side to side. This can be difficult but it's to give the treatment time to work.  When the treatment is over you can go to the toilet.  After your treatment there are some precautions you'll need to take.  This is because BCG is a live vaccine and other people shouldn't be exposed to it.  For the  next six hours, you'll need to avoid your urine splashing on the toilet seat and getting any urine on your hands.  It might be easer for men to sit down when they're using an ordinary toilet although using a stand up urinal should be alright.  The main this is to avoid splashing urine and spreading the vaccine.  You will also be asked to put 1/2 cup undiluted bleach into the toilet to destroy any live vaccine and leave it for 15 minutes until you flush for the next 6 voids.  Side Effects Because BCG goes directly into the bladder most of the side effects are linked with the bladder.  They usually go away within one to two days after your treatment.  The most common ones are: -needing to pass urine often -pain when you pass urine -blood in urine -flu-like symptoms (tiredness, general aching and raised temperature)  Theses side effects should settle down within a day or two.  If they don't get better contact your doctor.  Drinking lots of fluids can help flush the drug out of your bladder and reduce some of these effects.  Taking Ibuprofen or Aleve is encouraged unless you have a condition that would make these medications unsafe to take (renal failure, diabetes, gerd)  Rare side effects can include a continuing high temperature (fever), pain in your joints and a cough.  If you have any of these symptoms, or if you   feel generally unwell, contact your doctor.  These symptoms could be a sign of a more serious infection (due to BCG) that needs to be treated immediately.  If this happens you'll be treated with the same drugs (antibiotics) that are used to treat TB.  Contraception Men should use a condom during sex for the first 48 hours after their treatment.  If you are a women who has had BCG treatment then your partner should use a condom.  Using a condom will protect your partner from any vaccine present in your semen or vaginal fluid.  We don't know how BCG may affect a developing fetus so it's not  advisable to become pregnant or father a child while having it.  It is important to use effective contraception during your treatment and for six weeks afterwards.  You can discuss this with your doctor or specialist nurse.   

## 2022-05-10 ENCOUNTER — Encounter: Payer: Self-pay | Admitting: Physical Therapy

## 2022-05-10 ENCOUNTER — Ambulatory Visit: Payer: PPO | Admitting: Physical Therapy

## 2022-05-10 DIAGNOSIS — M25552 Pain in left hip: Secondary | ICD-10-CM

## 2022-05-10 DIAGNOSIS — M25551 Pain in right hip: Secondary | ICD-10-CM

## 2022-05-10 NOTE — Therapy (Signed)
OUTPATIENT PHYSICAL THERAPY SCREEN   Patient Name: [name] MRN: [id number] DOB:May 10, 1941, 81 y.o., male Today's Date: 05/10/2022  PCP: Ria Bush MD REFERRING PROVIDER: Gerrit Halls PA  END OF SESSION:   PT End of Session - 05/10/22 1307     Visit Number 9    Number of Visits 17    Date for PT Re-Evaluation 05/18/22    Authorization Type 9/10 Medicare    Authorization - Visit Number 9    Authorization - Number of Visits 10    Progress Note Due on Visit 10    PT Start Time 1302    PT Stop Time 1340    PT Time Calculation (min) 38 min    Activity Tolerance Treatment limited secondary to medical complications (Comment)    Behavior During Therapy [facility] for tasks assessed/performed                     Past Medical History:  Diagnosis Date   (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 06/2019 Echo (in setting of COVID): EF 35-40%, mild LVH, g1 DD, glob HK; b. 09/2019 Echo: EF 50-55%, Gr1 DD; c. 03/2021 Echo: EF 50-55%, no rwma, mild LVH, GrI DD, nl RV fxn. RVSP 44.54mHg. Mild-mod dil LA. Triv MR. Mild-mod Ao sclerosis.   AAA (abdominal aortic aneurysm) (HCC)    Actinic keratosis    Anginal pain ([place]    BENIGN PROSTATIC HYPERTROPHY, WITH URINARY OBSTRUCTION 09/06/2007   CAD s/p CABG    a. 1990 s/p MI-->CABG x 2; b. 2002 s/p BMS to LCX; c. 06/2015 Cath: LM 50ost, LAD 100ost/p, 639m, D2 nl, LCX  patent stent, RCA 80p (small), LIMA->LAD nl, VG->D2 nl-->Med Rx; d. 05/2020 MV: fixed apical ant/apical defect. No signif ischemia; e. 04/2021 MV: EF 52%, no ischemia.   Carotid arterial disease ([place]   a. 08/2016 Carotid U/S: <39% bilat.   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 did not tolerate CPAP.   Osteoarthritis    PAD (peripheral artery disease) ([place]   a. 07/2017 LE duplex: RSFA [id number] 75-73m38m-74d; c. 08/2018 Periph Angio: No signif AoIliac dzs. Mod-sev Ca2+ RSFA w/ diff dzs throughout- 3 vessel runoff. Borderline signif LSFA dzs w/ mod-sev Ca2+ vessels and 3 vessel runoff below the knee-->Med rx.   Pneumonia 07/2014   [facility] hospitalization   Pneumonia due to COVID-19 virus 06/02/2019   Pre-diabetes    Prostatitis, chronic    PSA, INCREASED 07/09/2008   Spinal stenosis of lumbar region    Past Surgical History:  Procedure Laterality Date   ABDOMINAL AORTOGRAM W/LOWER EXTREMITY [name] 09/12/2018   Procedure: ABDOMINAL AORTOGRAM  W/LOWER EXTREMITY;  Surgeon: [name], MD;  Location: [place] [facility];  Service: Cardiovascular;  Laterality: [name];   APPENDECTOMY     rupture   BACK SURGERY     1984 and then [facility]   BLADDER INSTILLATION [name] 10/19/2021   Procedure: BLADDER INSTILLATION OF GEMCITABINE;  Surgeon: Abbie Sons, MD;  Location: [facility];  Service: Urology;  Laterality: [name];   CARDIAC CATHETERIZATION [name] 07/07/2015   Procedure: Left Heart Cath and Cors/Grafts Angiography;  Surgeon: [name], MD;  Location: [place] [facility];  Service: Cardiovascular;  Laterality: [name];   CATARACT EXTRACTION, BILATERAL  2009   CERVICAL SPINE SURGERY  12/2016   cervical stenosis [name])   COLONOSCOPY  03/2010   HP polyp, diverticulosis, rpt 10 yrs ([name])   CORONARY ANGIOPLASTY   11/30/2000   LCX   CORONARY ARTERY BYPASS GRAFT  1990   3 vessel    CYSTOSCOPY  12/23/2010   [facility]   EYE SURGERY     cataract   JOINT REPLACEMENT     KNEE ARTHROSCOPY Right    x2   LAMINOTOMY  1986   L5/S1 lumbar laminotomy for two ruptured discs/fusion   LUMBAR LAMINECTOMY/DECOMPRESSION MICRODISCECTOMY [name] 07/13/2016   Procedure: LUMBAR TWO-THREE, LUMBAR THREE-FOUR, LUMBAR FOUR-FIVE LAMINECTOMY AND FORAMINOTOMY;  Surgeon: [name], MD;  Location: [place];  Service: Neurosurgery;  Laterality: [name];  LAMINECTOMY AND FORAMINOTOMY L2-L3, L3-L4,L4-L5   LUMBAR SPINE SURGERY  06/2020   Bilateral redo laminectomy/laminotomy/foraminotomies/medial facetectomy to decompress the bilateral L4 and L5 nerve roots [name])   TOTAL HIP ARTHROPLASTY Bilateral 5427,[id number]   TOTAL KNEE ARTHROPLASTY Right 03/06/2017   Procedure: RIGHT TOTAL KNEE ARTHROPLASTY;  Surgeon: Gaynelle Arabian, MD;  Location: [facility];  Service: Orthopedics;  Laterality: Right;   TRANSURETHRAL RESECTION OF BLADDER TUMOR [name] 10/19/2021   Procedure: TRANSURETHRAL RESECTION OF BLADDER TUMOR (TURBT);  Surgeon: Abbie Sons, MD;  Location: [facility];  Service: Urology;  Laterality: [name];   Patient Active Problem List   Diagnosis Date Noted   Right low back pain 12/29/2021   COPD exacerbation ([place]) 11/12/2021   Bladder cancer ([place]) 11/12/2021   Acute-on-chronic kidney injury ([place]) 07/09/2021   OSA (obstructive sleep apnea)    Acute on chronic diastolic CHF (congestive heart failure) ([place])    Right leg pain 06/22/2021   Nocturnal hypoxia 06/11/2021   CKD (chronic kidney disease) stage 3, GFR 30-59 ml/min ([place]) 02/24/2021   Peripheral neuropathy 02/23/2021   Spondylolisthesis of lumbar region 07/07/2020   COPD (chronic obstructive pulmonary disease) ([place]) 01/14/2020   Coccydynia 09/20/2019   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[place]) 07/08/2019   Anxiety 06/04/2019   Chronic right shoulder pain 09/10/2018   Cervical stenosis of  spinal canal 06/05/2017   Pedal edema 06/05/2017   Health maintenance examination 12/06/2016   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/13/2016   Overweight (BMI 25.0-29.9) 07/04/2016   Low vitamin B12 level 01/01/2016   Exertional dyspnea 07/07/2015   Medicare annual wellness visit, subsequent 07/03/2015   Advanced care planning/counseling discussion 07/03/2015   Acute recurrent frontal sinusitis 05/05/2015   Type 2 diabetes mellitus with other specified complication ([place]) 76/28/3151   Pleuritic chest pain 05/04/2015   Ex-smoker 05/04/2015   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   Spermatocele 04/02/2013   Syncopal vertigo 11/04/2010   Lumbar disc disease with radiculopathy 11/04/2010   CAD, ARTERY BYPASS GRAFT 08/11/2009   PAD (peripheral artery disease) ([place]) 08/11/2009   DUPUYTREN'S CONTRACTURE, 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  Carotid stenosis 07/09/2008   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 He was lifting something heavy and heard and felt a large pop. He did an MRI done that "showed a tear in the hamstring" but more importantly that he had bladder cancer. He went through 6 weeks of treatment for this, and is just now able to address the proximal hamstring pain. Pain has gotten better since initial injury but is still present. Pain is a dull ache that is constant. Worst pain 4/10; best 0/10. Pain is worsened by sitting, bending forward to pick things up and getting things out of his garden. Pt is retired, but enjoys gardening and wood working. Pt  reports 2 falls in past 6 months, one time tripping over a rock, tripped getting off his lawnmower but caught himself. Has some lingering SOB from having COVID in 2020, has RW and SPC at home because of this but does not use. Walking more limited by SOB symptoms, unlimited with hamstring. Standing is unlimited. Pt denies N/V, B&B changes, unexplained weight fluctuation, saddle paresthesia, fever, night sweats, or unrelenting night pain at this time.    PRECAUTIONS: none  SUBJECTIVE: Pt reports his hamstring has been painful this weekend. He has been having L foot swelling as well- denies calf pain. Hamstring pain today 4/10. Min compliance with HEP   PAIN:  Are you having pain? No   OBJECTIVE: (objective measures completed at initial evaluation unless otherwise dated)  DIAGNOSTIC FINDINGS:  MRI hamstring at [facility] Jan 2022 with partial tear of prox hamstring (per pt). Several other imaging in chart   PATIENT SURVEYS:  FOTO 51 with goal of 60   SCREENING FOR RED FLAGS: Bowel or bladder incontinence: No Spinal tumors: No Cauda equina syndrome: No Compression fracture: No Abdominal aneurysm: No   COGNITION:           Overall cognitive status: Within functional limits for tasks assessed                          SENSATION: WFL   MUSCLE LENGTH: Hamstrings: Right 145d deg; Left 120deg     POSTURE: decreased full hip ext bilat with decreased lumbar lordosis; lower crossed. Forward head, rounded shoulders, increased thoracic kyphosis   PALPATION: TTP with concordant pain to palpation of proximal hamstring insertion. Latent trigger points to mid glute max fiers over piriformis. No TTP at mid hamstring group muscle belly, distal insertion, of glute musculature   LUMBAR ROM:    Active  A/PROM  eval  Flexion WNL in sitting; in standing limited by hamstrings  Extension 50% limited  Right lateral flexion    Left lateral flexion    Right rotation 50% limited  Left rotation 50%  limited   (Blank rows = not tested)   LOWER EXTREMITY ROM:      Active/PROM Right eval Left eval  Hip flexion 100/100 100/100  Hip extension 0/10 0/10  Hip abduction Wnl wnl  Hip adduction wnl WNL  Hip internal rotation 25d both 20d both  Hip external  rotation WNL WNL  Knee flexion 130 Both 128 Both  Knee extension WNL WNL   Ankle dorsiflexion 10d/18d 10d/18d  Ankle plantarflexion      Ankle inversion      Ankle eversion       (Blank rows = not tested)   Knee flex in elys position L: 95d R 90d   LOWER EXTREMITY MMT:     MMT Right eval Left eval  Hip flexion 5 5  Hip extension 3- 2+  Hip abduction 4- 4-  Hip adduction      Hip internal rotation 24 20  Hip external rotation WNL WNL  Knee flexion 5 4 with concordant pain  Knee extension 5 5  Ankle dorsiflexion 5 5  Ankle plantarflexion      Ankle inversion      Ankle eversion       (Blank rows = not tested)   1RM Hamstring curl R 45# L 25#   LUMBAR SPECIAL TESTS:  FABER test: Negative and   FADIR TEST: NEGATIVE BUT WITH "STRETCH SENSATION   FUNCTIONAL TESTS:  5 times sit to stand: 19SEC 10 meter walk test: self selected 0.28ms fastest 1.060m SLS R: 14sec L: 21sec Squat only to parallel before heel raise compensation is needed   GAIT:              5036fith no AD used decreased speed, decreased terminal hip ext bilat presenting with decreased step length bilat, decreased RLE stance time with shortened LLE step > RLE     TODAY'S TREATMENT  Todays Intervention  Start of session: 142/71 Nustep seat 10 BLEs L4 for gentle strengthening BP following:  132/64 no adverse symptoms Single leg deadlift 10# DB 2x 10 with good carry over of initial cuing Omega straight leg deadlift 25# x8; 20# x10 with good carry over following demo for technique Stool scoot (hamstring curl) 2x [id number] endurance demand- unable to do full scoot with LLE only- use RLE for 25% only  Standing hamstring curl with eccentric lower 3# AW  2x 10  with cuing to prevent hip flex Seated L hamstring stretch 30sec      PATIENT EDUCATION:  Education details: therex form /technique Person educated: Patient Education method: ExplConsulting civil engineermoMedia plannerctCorporate treasurers, and Handouts Education comprehension: verbalized understanding, returned demonstration, and verbal cues required     HOME EXERCISE PROGRAM: Gentle hamstring stretch   ASSESSMENT:   CLINICAL IMPRESSION: PT continued therex progression for increased eccentric hamstring strengthening and lengthening ; with endurance demand as well, with success. Pt with increased need for rest breaks needed for bronchitis with ability to complete planned therex despite this.  Patient vitals monitored throughout session, diastolic low, but not symptomatic throughout session. Pt is able to comply with all cuing for proper technique of therex with good effort throughout session; no increased pain. PT will continue progression as able.      OBJECTIVE IMPAIRMENTS Abnormal gait, decreased activity tolerance, decreased balance, decreased cognition, decreased coordination, decreased endurance, decreased knowledge of use of DME, decreased mobility, difficulty walking, decreased ROM, decreased strength, decreased safety awareness, increased fascial restrictions, increased muscle spasms, impaired flexibility, improper body mechanics, and postural dysfunction.    ACTIVITY LIMITATIONS carrying, lifting, bending, sitting, standing, squatting, and stairs   PARTICIPATION LIMITATIONS: meal prep, cleaning, laundry, community activity, and yard work   PERSONAL FACTORS Age, Education, Past/current experiences, Time since onset of injury/illness/exacerbation, and 1 comorbidity: N  are also affecting patient's functional outcome.    REHAB POTENTIAL:  Good   CLINICAL DECISION MAKING: Evolving/moderate complexity   EVALUATION COMPLEXITY: Moderate     GOALS: Goals reviewed with patient? Yes   SHORT TERM  GOALS: Target date: 04/15/2022   Pt will be independent with HEP in order to improve strength and balance in order to decrease fall risk and improve function at home and work.   Baseline: Goal status: INITIAL     LONG TERM GOALS: Target date: 05/13/2022   Pt will demonstrate 1RM hamstring curl knee flex of LLE of at least 40.5# in order to demonstrate 90% of RLE, PLOF strength to complete heavy outdoor ADLs Baseline: 03/16/22 LLE 1RM 25# Goal status: INITIAL   2.  Pt will decrease worst pain as reported on NPRS by at least 3 points in order to demonstrate clinically significant reduction in pain.   Baseline: 4/10 Goal status: INITIAL   3.  Patient will increase FOTO score to 60 to demonstrate predicted increase in functional mobility to complete ADLs   Baseline: 51 Goal status: INITIAL   4.  Pt will increase 10MWT to  at least 1.20.13 m/s in order to demonstrate clinically significant improvement in uninhibited community ambulation.    Baseline: self selected 0.53ms; fastest 1.029m Goal status: INITIAL   5. Pt will decrease 5TSTS by at least 3 seconds in order to demonstrate clinically significant improvement in LE strength   Baseline: 19sec Goal status: INITIAL                     PLAN: PT FREQUENCY: 1-2x/week   PT DURATION: 8 weeks   PLANNED INTERVENTIONS: Therapeutic exercises, Therapeutic activity, Neuromuscular re-education, Balance training, Gait training, Patient/Family education, Self Care, Joint mobilization, Joint manipulation, Dry Needling, Cryotherapy, and Moist heat.   PLAN FOR NEXT SESSION: DGI, eccentric hamstring strengthening       ChDurwin RegesPT ChDurwin RegesPT 05/10/2022, 1:40 PM

## 2022-05-11 DIAGNOSIS — G4733 Obstructive sleep apnea (adult) (pediatric): Secondary | ICD-10-CM | POA: Diagnosis not present

## 2022-05-11 DIAGNOSIS — I10 Essential (primary) hypertension: Secondary | ICD-10-CM | POA: Diagnosis not present

## 2022-05-11 DIAGNOSIS — J449 Chronic obstructive pulmonary disease, unspecified: Secondary | ICD-10-CM | POA: Diagnosis not present

## 2022-05-11 DIAGNOSIS — M1710 Unilateral primary osteoarthritis, unspecified knee: Secondary | ICD-10-CM | POA: Diagnosis not present

## 2022-05-12 ENCOUNTER — Ambulatory Visit: Payer: PPO

## 2022-05-12 DIAGNOSIS — M25551 Pain in right hip: Secondary | ICD-10-CM

## 2022-05-12 DIAGNOSIS — M25552 Pain in left hip: Secondary | ICD-10-CM

## 2022-05-12 NOTE — Therapy (Signed)
OUTPATIENT PHYSICAL THERAPY TREATMENT Physical Therapy Progress Note   Dates of reporting period  03/16/22   to   05/12/22    Patient Name: [name] MRN: [id number] DOB:[birth year], 81 y.o., male Today's Date: 05/12/2022  PCP: Ria Bush MD REFERRING PROVIDER: Gerrit Halls PA  END OF SESSION:   PT End of Session - 05/12/22 1307     Visit Number 10    Number of Visits 17    Date for PT Re-Evaluation 05/18/22    Authorization Type [employer] P[employer]    Authorization Time Period 03/16/22-05/18/22    Progress Note Due on Visit 10    PT Start Time 1302    PT Stop Time 1342    PT Time Calculation (min) 40 min    Activity Tolerance Patient tolerated treatment well    Behavior During Therapy [facility] for tasks assessed/performed                     Past Medical History:  Diagnosis Date   (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 06/2019 Echo (in setting of COVID): EF 35-40%, mild LVH, g1 DD, glob HK; b. 09/2019 Echo: EF 50-55%, Gr1 DD; c. 03/2021 Echo: EF 50-55%, no rwma, mild LVH, GrI DD, nl RV fxn. RVSP 44.19mHg. Mild-mod dil LA. Triv MR. Mild-mod Ao sclerosis.   AAA (abdominal aortic aneurysm) (HCC)    Actinic keratosis    Anginal pain ([place]    BENIGN PROSTATIC HYPERTROPHY, WITH URINARY OBSTRUCTION 09/06/2007   CAD s/p CABG    a. 1990 s/p MI-->CABG x 2; b. 2002 s/p BMS to LCX; c. 06/2015 Cath: LM 50ost, LAD 100ost/p, 672m, D2 nl, LCX  patent stent, RCA 80p (small), LIMA->LAD nl, VG->D2 nl-->Med Rx; d. 05/2020 MV: fixed apical ant/apical defect. No signif ischemia; e. 04/2021 MV: EF 52%, no ischemia.   Carotid arterial disease ([facility]   a. 08/2016 Carotid U/S: <39% bilat.   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   CKD (chronic kidney disease), stage III ([place]   Community acquired pneumonia of right lower lobe of lung 06/05/2017   COVID-19 virus infection 05/30/2019   Covid PNA with  hospitalization 05/2019   DUPUYTREN'S CONTRACTURE, 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   DVT, HX OF 1998   Emphysema of lung ([facility]   GERD 04/30/2007   Headache    hx migraines   History of hiatal hernia    History of shingles    HYPERLIPIDEMIA 04/26/2007   HYPERTENSION 04/30/2007   INGUINAL HERNIA, RIGHT 05/26/2010   Laceration of skin of left hand 03/08/2018   Lumbar disc disease with radiculopathy    neuropathy in feet   [place]ocardial infarction ([place]   1989, 2002   OSA (obstructive sleep apnea)    a. did not tolerate CPAP.   Osteoarthritis    PAD (peripheral artery disease) ([place]   a. 07/2017 LE duplex: RSFA 75-9945mSFA 75-22m63m-74d; c. 08/2018 Periph Angio: No signif AoIliac dzs. Mod-sev Ca2+ RSFA w/ diff dzs throughout- 3 vessel runoff. Borderline signif LSFA dzs w/ mod-sev Ca2+ vessels and 3 vessel runoff below the knee-->Med rx.   Pneumonia 07/2014   [facility] hospitalization   Pneumonia due to COVID-19 virus 06/02/2019   Pre-diabetes    Prostatitis, chronic    PSA, INCREASED 07/09/2008   Spinal stenosis of lumbar region    Past Surgical History:  Procedure Laterality Date   ABDOMINAL AORTOGRAM W/LOWER EXTREMITY  [name] 09/12/2018   Procedure: ABDOMINAL AORTOGRAM W/LOWER EXTREMITY;  Surgeon: [name], MD;  Location: [place] [facility];  Service: Cardiovascular;  Laterality: [name];   APPENDECTOMY     rupture   BACK SURGERY     1984 and then [facility]   BLADDER INSTILLATION [name] 10/19/2021   Procedure: BLADDER INSTILLATION OF GEMCITABINE;  Surgeon: Abbie Sons, MD;  Location: [facility] ORS;  Service: Urology;  Laterality: [name];   CARDIAC CATHETERIZATION [name] 07/07/2015   Procedure: Left Heart Cath and Cors/Grafts Angiography;  Surgeon: [name], MD;  Location: [place] [facility];  Service: Cardiovascular;  Laterality: [name];   CATARACT EXTRACTION, BILATERAL  2009   CERVICAL SPINE SURGERY  12/2016   cervical stenosis [name])   COLONOSCOPY  03/2010   HP polyp,  diverticulosis, rpt 10 yrs ([name])   CORONARY ANGIOPLASTY  11/30/2000   LCX   CORONARY ARTERY BYPASS GRAFT  1990   3 vessel    CYSTOSCOPY  12/23/2010   [place]   EYE SURGERY     cataract   JOINT REPLACEMENT     KNEE ARTHROSCOPY Right    x2   LAMINOTOMY  1986   L5/S1 lumbar laminotomy for two ruptured discs/fusion   LUMBAR LAMINECTOMY/DECOMPRESSION MICRODISCECTOMY [name] 07/13/2016   Procedure: LUMBAR TWO-THREE, LUMBAR THREE-FOUR, LUMBAR FOUR-FIVE LAMINECTOMY AND FORAMINOTOMY;  Surgeon: [name], MD;  Location: [place];  Service: Neurosurgery;  Laterality: [name];  LAMINECTOMY AND FORAMINOTOMY L2-L3, L3-L4,L4-L5   LUMBAR SPINE SURGERY  06/2020   Bilateral redo laminectomy/laminotomy/foraminotomies/medial facetectomy to decompress the bilateral L4 and L5 nerve roots [name])   TOTAL HIP ARTHROPLASTY Bilateral [id number]   TOTAL KNEE ARTHROPLASTY Right 03/06/2017   Procedure: RIGHT TOTAL KNEE ARTHROPLASTY;  Surgeon: Gaynelle Arabian, MD;  Location: [facility];  Service: Orthopedics;  Laterality: Right;   TRANSURETHRAL RESECTION OF BLADDER TUMOR [name] 10/19/2021   Procedure: TRANSURETHRAL RESECTION OF BLADDER TUMOR (TURBT);  Surgeon: Abbie Sons, MD;  Location: [facility] OR[facility];  Service: Urology;  Laterality: [name];   Patient Active Problem List   Diagnosis Date Noted   Right low back pain 12/29/2021   COPD exacerbation ([place]) 11/12/2021   Bladder cancer ([place]) 11/12/2021   Acute-on-chronic kidney injury ([place]) 07/09/2021   OSA (obstructive sleep apnea)    Acute on chronic diastolic CHF (congestive heart failure) ([place])    Right leg pain 06/22/2021   Nocturnal hypoxia 06/11/2021   CKD (chronic kidney disease) stage 3, GFR 30-59 ml/min ([place]) 02/24/2021   Peripheral neuropathy 02/23/2021   Spondylolisthesis of lumbar region 07/07/2020   COPD (chronic obstructive pulmonary disease) ([place]) 01/14/2020   Coccydynia 09/20/2019   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[place]) 07/08/2019   Anxiety 06/04/2019    Chronic right shoulder pain 09/10/2018   Cervical stenosis of spinal canal 06/05/2017   Pedal edema 06/05/2017   Health maintenance examination 12/06/2016   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/13/2016   Overweight (BMI 25.0-29.9) 07/04/2016   Low vitamin B12 level 01/01/2016   Exertional dyspnea 07/07/2015   Medicare annual wellness visit, subsequent 07/03/2015   Advanced care planning/counseling discussion 07/03/2015   Acute recurrent frontal sinusitis 05/05/2015   Type 2 diabetes mellitus with other specified complication ([place]) 37/05/6268   Pleuritic chest pain 05/04/2015   Ex-smoker 05/04/2015   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   Spermatocele 04/02/2013   Syncopal vertigo 11/04/2010   Lumbar disc disease with radiculopathy 11/04/2010   CAD, ARTERY BYPASS GRAFT 08/11/2009   PAD (peripheral artery disease) ([place]) 08/11/2009  DUPUYTREN'S CONTRACTURE, 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 He was lifting something heavy and heard and felt a large pop. He did an MRI done that "showed a tear in the hamstring" but more importantly that he had bladder cancer. He went through 6 weeks of treatment for this, and is just now able to address the proximal hamstring pain. Pain has gotten better since initial injury but is still present. Pain is a dull ache that is constant. Worst pain 4/10; best 0/10. Pain is worsened by sitting, bending forward to pick things up and getting things out of his  garden. Pt is retired, but enjoys gardening and wood working. Pt reports 2 falls in past 6 months, one time tripping over a rock, tripped getting off his lawnmower but caught himself. Has some lingering SOB from having COVID in 2020, has RW and SPC at home because of this but does not use. Walking more limited by SOB symptoms, unlimited with hamstring. Standing is unlimited. Pt denies N/V, B&B changes, unexplained weight fluctuation, saddle paresthesia, fever, night sweats, or unrelenting night pain at this time.    PRECAUTIONS: none  SUBJECTIVE: No significant updates. HEP still performed. Sees Dr. [name] soon. Sitting still aggravates pain at Left tuberosity.   PAIN:  Are you having pain? No   OBJECTIVE: (objective measures completed at initial evaluation unless otherwise dated)           TODAY'S TREATMENT   BP:  Seated: 138/50 mmHg, 82 bpm  Standing: 136/54 mmHg 82 bpm   Nustep seat 10 BLE, L4 x5 minutes  Standing Left hamstrings Stretch 3x30sec  Standing Left hamstrings curl 1x12 @ 4lb (increased from 3lb last visit)  FOTO Survey: 58  Standing Left hamstrings curl 1x12 @ 4lb Stool scoot (hamstring curl) 1x65f  5xSTS: 9.06\sec hands free      PATIENT EDUCATION:  Education details: therex form /technique Person educated: Patient Education method: EConsulting civil engineer DMedia planner TCorporate treasurercues, and Handouts Education comprehension: verbalized understanding, returned demonstration, and verbal cues required     HOME EXERCISE PROGRAM: Gentle hamstring stretch   ASSESSMENT:   CLINICAL IMPRESSION: Visit 10, with reassessment. Pt showing progress all around. Pt able to progress hamstrings loading today, subjective loading in muscles but not in tendinous attachment at pelvis. Pt is able to comply with all cuing for proper technique of therex with good effort throughout session; no increased pain. PT will continue progression as able.      OBJECTIVE IMPAIRMENTS Abnormal gait,  decreased activity tolerance, decreased balance, decreased cognition, decreased coordination, decreased endurance, decreased knowledge of use of DME, decreased mobility, difficulty walking, decreased ROM, decreased strength, decreased safety awareness, increased fascial restrictions, increased muscle spasms, impaired flexibility, improper body mechanics, and postural dysfunction.    ACTIVITY LIMITATIONS carrying, lifting, bending, sitting, standing, squatting, and stairs   PARTICIPATION LIMITATIONS: meal prep, cleaning, laundry, community activity, and yard work   PERSONAL FACTORS Age, Education, Past/current experiences, Time since onset of injury/illness/exacerbation, and 1 comorbidity: N  are also affecting patient's functional outcome.  REHAB POTENTIAL: Good   CLINICAL DECISION MAKING: Evolving/moderate complexity   EVALUATION COMPLEXITY: Moderate     GOALS: Goals reviewed with patient? Yes   SHORT TERM GOALS: Target date: 04/15/2022   Pt will be independent with HEP in order to improve strength and balance in order to decrease fall risk and improve function at home and work.   Baseline: Goal status: Achieved      LONG TERM GOALS: Target date: 05/13/2022   Pt will demonstrate 1RM hamstring curl knee flex of LLE of at least 40.5# in order to demonstrate 90% of RLE, PLOF strength to complete heavy outdoor ADLs Baseline: 03/16/22 LLE 1RM 25# Goal status: ongoing   2.  Pt will decrease worst pain as reported on NPRS by at least 3 points in order to demonstrate clinically significant reduction in pain.   Baseline: 4/10; 05/12/22: 2-3/10 Goal status: INITIAL   3.  Patient will increase FOTO score to 60 to demonstrate predicted increase in functional mobility to complete ADLs   Baseline: 51; 05/12/22: 58 Goal status: On going   4.  Pt will increase 10MWT to  at least 1.20.13 m/s in order to demonstrate clinically significant improvement in uninhibited community ambulation.     Baseline: self selected 0.99ms; fastest 1.083m; 05/12/22: 0.8133mGoal status: On going   5. Pt will decrease 5TSTS by at least 3 seconds in order to demonstrate clinically significant improvement in LE strength   Baseline: 19 sec; 05/12/22: 9.06 sec hands free Goal status: achieved                      PLAN: PT FREQUENCY: 1-2x/week   PT DURATION: 8 weeks   PLANNED INTERVENTIONS: Therapeutic exercises, Therapeutic activity, Neuromuscular re-education, Balance training, Gait training, Patient/Family education, Self Care, Joint mobilization, Joint manipulation, Dry Needling, Cryotherapy, and Moist heat.   PLAN FOR NEXT SESSION: DGI, eccentric hamstring strengthening       1:11 PM, 05/12/22 AllEtta GrandchildT, DPT Physical Therapist - [phone])   [place] [facility] 05/12/2022, 1:11 PM

## 2022-05-13 ENCOUNTER — Ambulatory Visit: Payer: PPO | Admitting: Physician Assistant

## 2022-05-13 VITALS — BP 120/64 | HR 71 | Ht 72.0 in | Wt 212.0 lb

## 2022-05-13 DIAGNOSIS — C679 Malignant neoplasm of bladder, unspecified: Secondary | ICD-10-CM

## 2022-05-13 DIAGNOSIS — D494 Neoplasm of unspecified behavior of bladder: Secondary | ICD-10-CM | POA: Diagnosis not present

## 2022-05-13 DIAGNOSIS — N138 Other obstructive and reflux uropathy: Secondary | ICD-10-CM | POA: Diagnosis not present

## 2022-05-13 DIAGNOSIS — N3941 Urge incontinence: Secondary | ICD-10-CM | POA: Diagnosis not present

## 2022-05-13 DIAGNOSIS — N401 Enlarged prostate with lower urinary tract symptoms: Secondary | ICD-10-CM | POA: Diagnosis not present

## 2022-05-13 LAB — URINALYSIS, COMPLETE
Bilirubin, UA: NEGATIVE
Glucose, UA: NEGATIVE
Ketones, UA: NEGATIVE
Leukocytes,UA: NEGATIVE
Nitrite, UA: NEGATIVE
Protein,UA: NEGATIVE
RBC, UA: NEGATIVE
Specific Gravity, UA: 1.01 (ref 1.005–1.030)
Urobilinogen, Ur: 0.2 mg/dL (ref 0.2–1.0)
pH, UA: 5.5 (ref 5.0–7.5)

## 2022-05-13 LAB — MICROSCOPIC EXAMINATION: Bacteria, UA: NONE SEEN

## 2022-05-13 MED ORDER — GEMTESA 75 MG PO TABS: 75.0000 mg | ORAL_TABLET | Freq: Every day | ORAL | 0 refills | Status: DC

## 2022-05-13 MED ORDER — BCG LIVE 50 MG IS SUSR
3.2400 mL | Freq: Once | INTRAVESICAL | Status: AC
  Administered 2022-05-13: 81 mg via INTRAVESICAL

## 2022-05-13 NOTE — Patient Instructions (Signed)
Bacillus Calmette-Guerin Live, BCG intravesical solution What is this medication? BACILLUS CALMETTE-GUERIN LIVE, BCG (ba SIL us  KAL met  gay RAYN) is a bacteria solution. This medicine stimulates the immune system to ward off cancer cells. It is used to treat bladder cancer. This medicine may be used for other purposes; ask your health care provider or pharmacist if you have questions. This medicine may be used for other purposes; ask your health care provider or pharmacist if you have questions. COMMON BRAND NAME(S): Theracys, TICE BCG What should I tell my care team before I take this medication? They need to know if you have any of these conditions: aneurysm blood in the urine bladder biopsy within 2 weeks fever or infection immune system problems leukemia lymphoma myasthenia gravis need organ transplant prosthetic device like arterial graft, artificial joint, prosthetic heart valve recent or ongoing radiation therapy tuberculosis an unusual or allergic reaction to Bacillus Calmette-Guerin Live, BCG, latex, other medicines, foods, dyes, or preservatives pregnant or trying to get pregnant breast-feeding How should I use this medication? This drug is given as a catheter infusion into the bladder. It is administered in a hospital or clinic by a specially trained health care provider. You will be given directions to follow before the treatment. Follow your health care provider's directions carefully. This medicine contains live bacteria. It is very important to follow these directions closely after treatment to prevent others from coming in contact with your urine. Your health care provider may give you additional directions to follow. Try to hold this medicine in your bladder for 1 to 2 hours. Follow these directions the first time you go to the bathroom and for 6 hours after the first void. Wash your hands before using the restroom. After voiding, wash your hands and genital area. Use a  toilet and sit when going to the bathroom. This helps to prevent the urine from splashing. Do not use public toilets or void outside. After each void, add 2 cups of undiluted bleach to the toilet. Close the lid. Wait 15 to 20 minutes and then flush the toilet. After the first void, drink more fluids to help dilute your urine. If you have urinary incontinence, wash the clothes you were wearing in the washer immediately. Do not wash other clothes at the same time. If you are wearing an incontinence pad, pour bleach on the pad and allow it to soak into the pad before throwing it away. Put the pad in a plastic bag and put it in the trash. Talk to your pediatrician regarding the use of this medicine in children. Special care may be needed. Overdosage: If you think you have taken too much of this medicine contact a poison control center or emergency room at once. NOTE: This medicine is only for you. Do not share this medicine with others. Overdosage: If you think you have taken too much of this medicine contact a poison control center or emergency room at once. NOTE: This medicine is only for you. Do not share this medicine with others. What if I miss a dose? It is important not to miss your dose. Call your doctor or health care professional if you are unable to keep an appointment. What may interact with this medication? antibiotics medicines to suppress your immune system like chemotherapy agents or corticosteroids medicine to treat tuberculosis This list may not describe all possible interactions. Give your health care provider a list of all the medicines, herbs, non-prescription drugs, or dietary supplements you use.   Also tell them if you smoke, drink alcohol, or use illegal drugs. Some items may interact with your medicine. This list may not describe all possible interactions. Give your health care provider a list of all the medicines, herbs, non-prescription drugs, or dietary supplements you use.  Also tell them if you smoke, drink alcohol, or use illegal drugs. Some items may interact with your medicine. What should I watch for while using this medication? Visit your health care provider for checks on your progress. This medicine may make you feel generally unwell. Contact your health care provider if your symptoms last more than 2 days or if they get worse. Call your health care provider right away if you have a severe or unusual symptom. Infection can be spread to others through contact with this medicine. To prevent the spread of infection, follow your health care provider's directions carefully after treatment. Do not become pregnant while taking this medicine. There is a potential for serious side effects to an unborn child. Talk to your health care provider for more information. Do not breast-feed an infant while taking this medicine. If you have sex while on this medicine, use a condom. Ask your health care provider how long you should use a condom. What side effects may I notice from receiving this medication? Side effects that you should report to your doctor or health care professional as soon as possible: allergic reactions like skin rash, itching or hives, swelling of the face, lips, or tongue signs of infection - fever or chills, cough, sore throat, pain or difficulty passing urine signs of decreased red blood cells - unusually weak or tired, fainting spells, lightheadedness blood in urine breathing problems cough eye pain, redness flu-like symptoms joint pain bladder-area pain for more than 2 days after treatment trouble passing urine or change in the amount of urine vomiting yellowing of the eyes or skin Side effects that usually do not require medical attention (report to your doctor or health care professional if they continue or are bothersome): bladder spasm burning when passing urine within 2 days of treatment feel need to pass urine often or wake up at night to  pass urine loss of appetite This list may not describe all possible side effects. Call your doctor for medical advice about side effects. You may report side effects to FDA at 1-800-FDA-1088. This list may not describe all possible side effects. Call your doctor for medical advice about side effects. You may report side effects to FDA at 1-800-FDA-1088. Where should I keep my medication? This drug is given in a hospital or clinic and will not be stored at home. NOTE: This sheet is a summary. It may not cover all possible information. If you have questions about this medicine, talk to your doctor, pharmacist, or health care provider.  2023 Elsevier/Gold Standard (2007-09-22 00:00:00)  

## 2022-05-13 NOTE — Progress Notes (Signed)
BCG Bladder Instillation  BCG # 2 of 3  Due to Bladder Cancer patient is present today for a BCG treatment. Patient was cleaned and prepped in a sterile fashion with betadine. A 14FR catheter was inserted, urine return was noted 270m, urine was yellow in color.  53mof reconstituted BCG was instilled into the bladder. The catheter was then removed. Patient tolerated well, no complications were noted  Performed by: SaDebroah LoopPA-C and TaMardelle MatteCMA  Additional notes: Patient reports increased urgency and urge incontinence in the past week.  He is already on Flomax and tolterodine.  UA is bland.  We will have him continue Flomax and switch from tolterodine to [facility] today.  We will plan for symptom recheck and PVR in 4 weeks.  Follow up/ Additional notes: 1 week for BCG #3 of 3 + 4-week symptom recheck with PVR and IPSS

## 2022-05-16 DIAGNOSIS — I25708 Atherosclerosis of coronary artery bypass graft(s), unspecified, with other forms of angina pectoris: Secondary | ICD-10-CM | POA: Diagnosis not present

## 2022-05-16 DIAGNOSIS — N1831 Chronic kidney disease, stage 3a: Secondary | ICD-10-CM | POA: Diagnosis not present

## 2022-05-16 DIAGNOSIS — I739 Peripheral vascular disease, unspecified: Secondary | ICD-10-CM | POA: Diagnosis not present

## 2022-05-16 DIAGNOSIS — G4733 Obstructive sleep apnea (adult) (pediatric): Secondary | ICD-10-CM | POA: Diagnosis not present

## 2022-05-16 DIAGNOSIS — J449 Chronic obstructive pulmonary disease, unspecified: Secondary | ICD-10-CM | POA: Diagnosis not present

## 2022-05-16 DIAGNOSIS — Z955 Presence of coronary angioplasty implant and graft: Secondary | ICD-10-CM | POA: Diagnosis not present

## 2022-05-16 DIAGNOSIS — I509 Heart failure, unspecified: Secondary | ICD-10-CM | POA: Diagnosis not present

## 2022-05-17 ENCOUNTER — Ambulatory Visit: Payer: PPO | Admitting: Pulmonary Disease

## 2022-05-17 DIAGNOSIS — I10 Essential (primary) hypertension: Secondary | ICD-10-CM | POA: Diagnosis not present

## 2022-05-17 DIAGNOSIS — G4733 Obstructive sleep apnea (adult) (pediatric): Secondary | ICD-10-CM | POA: Diagnosis not present

## 2022-05-17 DIAGNOSIS — R0683 Snoring: Secondary | ICD-10-CM | POA: Diagnosis not present

## 2022-05-18 ENCOUNTER — Ambulatory Visit: Payer: PPO | Attending: Physician Assistant | Admitting: Physical Therapy

## 2022-05-18 ENCOUNTER — Encounter: Payer: Self-pay | Admitting: Physical Therapy

## 2022-05-18 DIAGNOSIS — M25551 Pain in right hip: Secondary | ICD-10-CM | POA: Diagnosis not present

## 2022-05-18 DIAGNOSIS — M25552 Pain in left hip: Secondary | ICD-10-CM | POA: Diagnosis not present

## 2022-05-18 NOTE — Therapy (Signed)
OUTPATIENT PHYSICAL THERAPY TREATMENT Physical Therapy Discharge Summary   Dates of reporting period  03/16/22   to   05/18/22    Patient Name: [name] MRN: [id number] DOB:[birth year], 81 y.o., male Today's Date: 05/18/2022  PCP: Ria Bush MD REFERRING PROVIDER: Gerrit Halls PA  END OF SESSION:             Past Medical History:  Diagnosis Date   ([place]) heart failure with improved ejection fraction ([place])    a. 06/2019 Echo (in setting of COVID): EF 35-40%, mild LVH, g1 DD, glob HK; b. 09/2019 Echo: EF 50-55%, Gr1 DD; c. 03/2021 Echo: EF 50-55%, no rwma, mild LVH, GrI DD, nl RV fxn. RVSP 44.100mHg. Mild-mod dil LA. Triv MR. Mild-mod Ao sclerosis.   AAA (abdominal aortic aneurysm) (HCC)    Actinic keratosis    Anginal pain ([place]    BENIGN PROSTATIC HYPERTROPHY, WITH URINARY OBSTRUCTION 09/06/2007   CAD s/p CABG    a. 1990 s/p MI-->CABG x 2; b. 2002 s/p BMS to LCX; c. 06/2015 Cath: LM 50ost, LAD 100ost/p, 660m, D2 nl, LCX  patent stent, RCA 80p (small), LIMA->LAD nl, VG->D2 nl-->Med Rx; d. 05/2020 MV: fixed apical ant/apical defect. No signif ischemia; e. 04/2021 MV: EF 52%, no ischemia.   Carotid arterial disease ([place]   a. 08/2016 Carotid U/S: <39% bilat.   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 did not tolerate CPAP.   Osteoarthritis    PAD (peripheral artery disease) ([place]   a. 07/2017 LE duplex: RSFA 75-9967mSFA 75-50m61m-74d; c. 08/2018 Periph Angio: No signif AoIliac dzs. Mod-sev Ca2+ RSFA w/ diff dzs throughout- 3 vessel runoff. Borderline signif LSFA dzs w/ mod-sev Ca2+ vessels and 3 vessel runoff below the knee-->Med rx.   Pneumonia 07/2014   [facility] hospitalization   Pneumonia due to COVID-19 virus 06/02/2019   Pre-diabetes    Prostatitis, chronic    PSA, INCREASED 07/09/2008   Spinal stenosis of lumbar region    Past Surgical History:  Procedure Laterality Date   ABDOMINAL AORTOGRAM W/LOWER EXTREMITY [name] 09/12/2018   Procedure: ABDOMINAL AORTOGRAM W/LOWER EXTREMITY;  Surgeon: AridWellington Hampshire;  [facility];  Service: Cardiovascular;  Laterality: [name];   APPENDECTOMY     rupture   BACK SURGERY     1984 and then another one [facility]   BLADDER INSTILLATION [name] 10/19/2021   Procedure: BLADDER INSTILLATION OF GEMCITABINE;  Surgeon: StoiAbbie Sons;  Location: [facility];  Service: Urology;  Laterality: [name];   CARDIAC CATHETERIZATION [name] 07/07/2015   Procedure: Left Heart Cath and Cors/Grafts Angiography;  Surgeon: TimoMinna Merritts;  [facility];  Service: Cardiovascular;  Laterality: [name];   CATARACT EXTRACTION, BILATERAL  2009   CERVICAL SPINE SURGERY  12/2016   cervical stenosis [name])   COLONOSCOPY  03/2010   HP polyp, diverticulosis, rpt 10 yrs ([name])   CORONARY ANGIOPLASTY  11/30/2000   [facility]   CORONARY ARTERY BYPASS GRAFT  1990   3 vessel    CYSTOSCOPY  12/23/2010   [place]   EYE SURGERY     cataract   JOINT REPLACEMENT     KNEE ARTHROSCOPY Right    x2   LAMINOTOMY  1986   L5/S1 lumbar laminotomy for two ruptured discs/fusion   LUMBAR LAMINECTOMY/DECOMPRESSION MICRODISCECTOMY [name] 07/13/2016   Procedure: LUMBAR TWO-THREE, LUMBAR THREE-FOUR, LUMBAR FOUR-FIVE LAMINECTOMY AND  FORAMINOTOMY;  Surgeon: [name], MD;  Location: [place];  Service: Neurosurgery;  Laterality: [name];  LAMINECTOMY AND FORAMINOTOMY L2-L3, L3-L4,L4-L5   LUMBAR SPINE SURGERY  06/2020   Bilateral redo laminectomy/laminotomy/foraminotomies/medial facetectomy to decompress the bilateral L4 and L5 nerve roots [name])   TOTAL HIP ARTHROPLASTY Bilateral [id number]   TOTAL KNEE ARTHROPLASTY Right 03/06/2017   Procedure: RIGHT TOTAL KNEE ARTHROPLASTY;  Surgeon: Gaynelle Arabian, MD;  Location: [facility];  Service: Orthopedics;  Laterality: Right;   TRANSURETHRAL RESECTION OF BLADDER TUMOR [name] 10/19/2021   Procedure: TRANSURETHRAL RESECTION OF BLADDER TUMOR (TURBT);  Surgeon: Abbie Sons, MD;  Location: [facility];  Service: Urology;  Laterality: [name];   Patient Active Problem List   Diagnosis Date Noted   Right low back pain 12/29/2021   COPD exacerbation ([place]) 11/12/2021   Bladder cancer ([place]) 11/12/2021   Acute-on-chronic kidney injury ([place]) 07/09/2021   OSA (obstructive sleep apnea)    Acute on chronic diastolic CHF (congestive heart failure) ([place])    Right leg pain 06/22/2021   Nocturnal hypoxia 06/11/2021   CKD (chronic kidney disease) stage 3, GFR 30-59 ml/min (HCC) 02/24/2021   Peripheral neuropathy 02/23/2021   Spondylolisthesis of lumbar region 07/07/2020   COPD (chronic obstructive pulmonary disease) ([place]) 01/14/2020   Coccydynia 09/20/2019   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   Anxiety 06/04/2019   Chronic right shoulder pain 09/10/2018   Cervical stenosis of spinal canal 06/05/2017   Pedal edema 06/05/2017   Health maintenance examination 12/06/2016   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/13/2016   Overweight (BMI 25.0-29.9) 07/04/2016   Low vitamin B12 level 01/01/2016   Exertional dyspnea 07/07/2015   Medicare annual wellness visit, subsequent 07/03/2015   Advanced care planning/counseling discussion 07/03/2015   Acute recurrent frontal sinusitis  05/05/2015   Type 2 diabetes mellitus with other specified complication ([place]) 17/71/1657   Pleuritic chest pain 05/04/2015   Ex-smoker 05/04/2015   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  Rationale for Evaluation and Treatment Rehabilitation  PERTINENT HISTORY: Pt reports to PT with hamstring strain last January 2022. He was lifting something heavy and heard and felt a large pop. He did an MRI done that "  showed a tear in the hamstring" but more importantly that he had bladder cancer. He went through 6 weeks of treatment for this, and is just now able to address the proximal hamstring pain. Pain has gotten better since initial injury but is still present. Pain is a dull ache that is constant. Worst pain 4/10; best 0/10. Pain is worsened by sitting, bending forward to pick things up and getting things out of his garden. Pt is retired, but enjoys gardening and wood working. Pt reports 2 falls in past 6 months, one time tripping over a rock, tripped getting off his lawnmower but caught himself. Has some lingering SOB from having COVID in 2020, has RW and SPC at home because of this but does not use. Walking more limited by SOB symptoms, unlimited with hamstring. Standing is unlimited. Pt denies N/V, B&B changes, unexplained weight fluctuation, saddle paresthesia, fever, night sweats, or unrelenting night pain  at this time.    PRECAUTIONS: none  SUBJECTIVE: Would like to d/c from hip today. Is having neck surgery soon.   PAIN:  Are you having pain? No   OBJECTIVE: (objective measures completed at initial evaluation unless otherwise dated)           TODAY'S TREATMENT   Todays Intervention  Start of session: 142/71 Nustep seat 10 BLEs L4 for gentle strengthening  PT reviewed the following HEP with patient with patient able to demonstrate a set of the following with min cuing for correction needed. PT educated patient on parameters of therex (how/when to inc/decrease intensity, frequency, rep/set range, stretch hold time, and purpose of therex) with verbalized understanding.   Access Code: [email or web address] - Standing Hip Hinge  - 1 x daily - 1-2 x weekly - 3 sets - 8-12 reps - Single Leg Bridge  - 1 x daily - 1-2 x weekly - 3 sets - 8-12 reps - Reverse Lunge  - 1 x daily - 1-2 x weekly - 3 sets - 12-20 reps - Seated Hamstring Stretch  - 1 x daily - 7 x weekly - 3 sets - 10 reps     PATIENT EDUCATION:  Education details: therex form /technique Person educated: Patient Education method: Consulting civil engineer, Media planner, Corporate treasurer cues, and Handouts Education comprehension: verbalized understanding, returned demonstration, and verbal cues required     HOME EXERCISE PROGRAM: [email or web address]   ASSESSMENT:   CLINICAL IMPRESSION: PT reassessed goals this session where patient has met all goals to safely d/c formal PT. Patient is able to demonstrate and verbalize understanding of all HEP recommendations with minimal corrections needed. Pt given clinic contact info should further questions or concerns arise. Pt to d/c PT.       OBJECTIVE IMPAIRMENTS Abnormal gait, decreased activity tolerance, decreased balance, decreased cognition, decreased coordination, decreased endurance, decreased knowledge of use of DME, decreased mobility, difficulty walking, decreased ROM, decreased strength, decreased safety  awareness, increased fascial restrictions, increased muscle spasms, impaired flexibility, improper body mechanics, and postural dysfunction.    ACTIVITY LIMITATIONS carrying, lifting, bending, sitting, standing, squatting, and stairs   PARTICIPATION LIMITATIONS: meal prep, cleaning, laundry, community activity, and yard work   PERSONAL FACTORS Age, Education, Past/current experiences, Time since onset of injury/illness/exacerbation, and 1 comorbidity: N  are also affecting patient's functional outcome.    REHAB POTENTIAL: Good   CLINICAL DECISION MAKING: Evolving/moderate complexity   EVALUATION COMPLEXITY: Moderate     GOALS: Goals reviewed with patient? Yes   SHORT TERM GOALS: Target date: 04/15/2022   Pt will be independent with  HEP in order to improve strength and balance in order to decrease fall risk and improve function at home and work.   Baseline: Goal status: Achieved      LONG TERM GOALS: Target date: 05/13/2022   Pt will demonstrate 1RM hamstring curl knee flex of LLE of at least 40.5# in order to demonstrate 90% of RLE, PLOF strength to complete heavy outdoor ADLs Baseline: 03/16/22 LLE 1RM 25# Goal status: ongoing   2.  Pt will decrease worst pain as reported on NPRS by at least 3 points in order to demonstrate clinically significant reduction in pain.   Baseline: 4/10; 05/12/22: 2-3/10 Goal status: achieved   3.  Patient will increase FOTO score to 60 to demonstrate predicted increase in functional mobility to complete ADLs   Baseline: 51; 05/12/22: 58; 05/18/22 73 Goal status: achieved   4.  Pt will increase 10MWT to  at least 1.20.13 m/s in order to demonstrate clinically significant improvement in uninhibited community ambulation.    Baseline: self selected 0.70ms; fastest 1.016m; 05/12/22: 0.8147mGoal status: On going   5. Pt will decrease 5TSTS by at least 3 seconds in order to demonstrate clinically significant improvement in LE strength   Baseline: 19  sec; 05/12/22: 9.06 sec hands free Goal status: achieved                      PLAN: PT FREQUENCY: 1-2x/week   PT DURATION: 8 weeks   PLANNED INTERVENTIONS: Therapeutic exercises, Therapeutic activity, Neuromuscular re-education, Balance training, Gait training, Patient/Family education, Self Care, Joint mobilization, Joint manipulation, Dry Needling, Cryotherapy, and Moist heat.   PLAN FOR NEXT SESSION: DGI, eccentric hamstring strengthening        CheDurwin RegesT 05/18/2022, 12:00 PM

## 2022-05-19 ENCOUNTER — Ambulatory Visit: Payer: PPO | Attending: Cardiovascular Disease | Admitting: Cardiovascular Disease

## 2022-05-19 ENCOUNTER — Encounter: Payer: Self-pay | Admitting: Cardiovascular Disease

## 2022-05-19 VITALS — BP 148/62 | HR 79 | Ht 72.0 in | Wt 215.4 lb

## 2022-05-19 DIAGNOSIS — I25810 Atherosclerosis of coronary artery bypass graft(s) without angina pectoris: Secondary | ICD-10-CM | POA: Diagnosis not present

## 2022-05-19 DIAGNOSIS — E785 Hyperlipidemia, unspecified: Secondary | ICD-10-CM

## 2022-05-19 DIAGNOSIS — I739 Peripheral vascular disease, unspecified: Secondary | ICD-10-CM | POA: Diagnosis not present

## 2022-05-19 DIAGNOSIS — I10 Essential (primary) hypertension: Secondary | ICD-10-CM | POA: Diagnosis not present

## 2022-05-19 NOTE — Progress Notes (Signed)
Cardiology Office Note   Date:  05/19/2022   ID:  MOUA [name], DOB March 04, 1941, MRN [id number]  PCP:  Ria Bush, MD  Cardiologist:  Dr. Rockey Situ   Chief Complaint  Patient presents with   Other    12 month f/u c/o edema. Meds reviewed verbally with pt.       History of Present Illness: [name] is a 81 y.o. male who is here today for a follow-up visit regarding peripheral arterial disease. The patient has known history of coronary artery disease status post CABG in 1990, PCI of the left circumflex in 2002, DVT in the left lower extremity, hyperlipidemia and back surgery for spinal stenosis in 2017.  He had hip replacement x 2. 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  He was seen in early 2019 for peripheral arterial disease. Lower extremity arterial Doppler showed noncompressible arteries on the right side with normal toe brachial index and biphasic waveform.  Left ABI was normal with biphasic waveforms. Lower extremity arterial duplex showed evidence of bilateral SFA disease. His symptoms were felt to be multifactorial due to PAD, arthritis and severe peripheral neuropathy.  He had worsening bilateral leg pain in 2020.  Angiography in January of 2020 showed no significant aortoiliac disease.  On the right side, there was moderate to severely calcified SFA with diffuse disease especially in the midsegment and three-vessel runoff below the knee.  On the left, there was borderline significant SFA disease which was also calcified with three-vessel runoff below the knee.  I felt that his symptoms were out of proportion to his angiogram findings. The patient was hospitalized in October 2020 with COVID-19 pneumonia and had multiple complications.    He had back surgery done in November 2021 with significant improvement in his back pain and leg pain.   He was diagnosed with bladder cancer last year  and was treated with surgery.  He completed a 6-week course of BCG.  He has been doing reasonably well with no significant calf claudication.  He has significant neuropathic pain.  In addition, he is anticipating to have cervical spine surgery in the near future.  No chest pain or worsening dyspnea.  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 06/2019 Echo (in setting of COVID): EF 35-40%, mild LVH, g1 DD, glob HK; b. 09/2019 Echo: EF 50-55%, Gr1 DD; c. 03/2021 Echo: EF 50-55%, no rwma, mild LVH, GrI DD, nl RV fxn. RVSP 44.88mHg. Mild-mod dil LA. Triv MR. Mild-mod Ao sclerosis.   AAA (abdominal aortic aneurysm) (HCC)    Actinic keratosis    Anginal pain ([facility]    BENIGN PROSTATIC HYPERTROPHY, WITH URINARY OBSTRUCTION 09/06/2007   CAD s/p CABG    a. 1990 s/p MI-->CABG x 2; b. 2002 s/p BMS to LCX; c. 06/2015 Cath: LM 50ost, LAD 100ost/p, 675m, D2 nl, LCX  patent stent, RCA 80p (small), LIMA->LAD nl, VG->D2 nl-->Med Rx; d. 05/2020 MV: fixed apical ant/apical defect. No signif ischemia; e. 04/2021 MV: EF 52%, no ischemia.   Carotid arterial disease ([facility]   a. 08/2016 Carotid U/S: <39% bilat.   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 did not  tolerate CPAP.   Osteoarthritis    PAD (peripheral artery disease) ([place])    a. 07/2017 LE duplex: RSFA 75-52m LSFA [id number]; c. 08/2018 Periph Angio: No signif AoIliac dzs. Mod-sev Ca2+ RSFA w/ diff dzs throughout- 3 vessel runoff. Borderline signif LSFA dzs w/ mod-sev Ca2+ vessels and 3 vessel runoff below the knee-->Med rx.   Pneumonia 07/2014   [facility] hospitalization   Pneumonia due to COVID-19 virus 06/02/2019   Pre-diabetes    Prostatitis, chronic    PSA, INCREASED 07/09/2008   Spinal stenosis of lumbar region     Past Surgical History:  Procedure Laterality Date   ABDOMINAL AORTOGRAM W/LOWER EXTREMITY [name] 09/12/2018   Procedure: ABDOMINAL AORTOGRAM W/LOWER EXTREMITY;  Surgeon: ArWellington HampshireMD;  [facility];  Service: Cardiovascular;  Laterality: [name];   APPENDECTOMY     rupture   BACK SURGERY     1984 and then [facility]   BLADDER INSTILLATION [name] 10/19/2021   Procedure: BLADDER INSTILLATION OF GEMCITABINE;  Surgeon: StAbbie SonsMD;  Location: [facility];  Service: Urology;  Laterality: [name];   CARDIAC CATHETERIZATION [name] 07/07/2015   Procedure: Left Heart Cath and Cors/Grafts Angiography;  Surgeon: [name] MerrittsMD;  [facility];  Service: Cardiovascular;  Laterality: [name];   CATARACT EXTRACTION, BILATERAL  2009   CERVICAL SPINE SURGERY  12/2016   cervical stenosis ([name]  COLONOSCOPY  03/2010   HP polyp, diverticulosis, rpt 10 yrs ([name])   CORONARY ANGIOPLASTY  11/30/2000   LCX   CORONARY ARTERY BYPASS GRAFT  1990   3 vessel    CYSTOSCOPY  12/23/2010   [facility]   EYE SURGERY     cataract   JOINT REPLACEMENT     KNEE ARTHROSCOPY Right    x2   LAMINOTOMY  1986   L5/S1 lumbar laminotomy for two ruptured discs/fusion   LUMBAR LAMINECTOMY/DECOMPRESSION MICRODISCECTOMY [name] 07/13/2016   Procedure: LUMBAR TWO-THREE, LUMBAR THREE-FOUR, LUMBAR FOUR-FIVE LAMINECTOMY AND FORAMINOTOMY;  Surgeon: JeNewman PiesMD;   [place] [facility] Service: Neurosurgery;  Laterality: [name];  LAMINECTOMY AND FORAMINOTOMY L2-L3, L3-L4,L4-L5   LUMBAR SPINE SURGERY  06/2020   Bilateral redo laminectomy/laminotomy/foraminotomies/medial facetectomy to decompress the bilateral L4 and L5 nerve roots (JArnoldo [name]  TOTAL HIP ARTHROPLASTY Bilateral [id number] TOTAL KNEE ARTHROPLASTY Right 03/06/2017   Procedure: RIGHT TOTAL KNEE ARTHROPLASTY;  Surgeon: AlGaynelle ArabianMD;  Location[facility];  Service: Orthopedics;  Laterality: Right;   TRANSURETHRAL RESECTION OF BLADDER TUMOR [name] 10/19/2021   Procedure: TRANSURETHRAL RESECTION OF BLADDER TUMOR (TURBT);  Surgeon: StAbbie SonsMD;  Location: [facility];  Service: Urology;  Laterality: [name];     Current Outpatient Medications  Medication Sig Dispense Refill   acetaminophen (TYLENOL) 500 MG tablet Take 1,000 mg by mouth every 8 (eight) hours as needed for moderate pain.  aspirin EC 81 MG EC tablet Take 1 tablet (81 mg total) by mouth daily. 30 tablet 0   atorvastatin (LIPITOR) 20 MG tablet TAKE 1 TABLET BY MOUTH DAILY. 90 tablet 3   Azelastine HCl (ASTEPRO) 0.15 % SOLN Place 1 spray into the nose at bedtime as needed (Sinus congestion, allergies). 30 mL 11   cyclobenzaprine (FLEXERIL) 5 MG tablet Take 1 tablet (5 mg total) by mouth 2 (two) times daily as needed for muscle spasms (sedation precautions). 20 tablet 0   docusate sodium (COLACE) 100 MG capsule Take 100 mg by mouth 2 (two) times daily as needed for mild constipation.     ezetimibe (ZETIA) 10 MG tablet TAKE 1 TABLET BY MOUTH ONCE DAILY. 90 tablet 2   fluticasone (FLONASE) 50 MCG/ACT nasal spray Place 1 spray into both nostrils as needed for allergies or rhinitis.     furosemide (LASIX) 20 MG tablet Take 1 tablet by mouth every third day as directed. 30 tablet 2   gabapentin (NEURONTIN) 600 MG tablet Take 600 mg by mouth 3 (three) times daily.     ipratropium-albuterol (DUONEB) 0.5-2.5 (3) MG/3ML SOLN Take 3 mLs by  nebulization every 6 (six) hours as needed. 360 mL 11   isosorbide mononitrate (IMDUR) 30 MG 24 hr tablet Take 0.5 tablets (15 mg total) by mouth daily. 45 tablet 3   losartan (COZAAR) 25 MG tablet TAKE 1 TABLET BY MOUTH DAILY 90 tablet 2   Nebulizers (COMPRESSOR/NEBULIZER) MISC Use as directed. 1 each 0   nitroGLYCERIN (NITROSTAT) 0.4 MG SL tablet Place 1 tablet (0.4 mg total) under the tongue every 5 (five) minutes as needed for chest pain. 25 tablet 3   OVER THE COUNTER MEDICATION Apply 1 application topically 2 (two) times daily as needed (pain). Cryotherapy roll on gel     OXYGEN Inhale 2 L into the lungs at bedtime.     pantoprazole (PROTONIX) 40 MG tablet Take 40 mg by mouth 2 (two) times daily.     polyethylene glycol (MIRALAX / GLYCOLAX) 17 g packet Take 17 g by mouth daily as needed for mild constipation.      PROAIR HFA 108 (90 Base) MCG/ACT inhaler TAKE 2 PUFFS INTO LUNGS EVERY 6 HOURS ASNEEDED FOR WHEEZING OR SHORTNESS OF BREATH 8.5 g 6   ranolazine (RANEXA) 500 MG 12 hr tablet TAKE ONE (1) TABLET BY MOUTH TWO TIMES PER DAY 180 tablet 3   tamsulosin (FLOMAX) 0.4 MG CAPS capsule TAKE 1 CAPSULE BY MOUTH AT BEDTIME 90 capsule 1   Vibegron (GEMTESA) 75 MG TABS Take 75 mg by mouth daily. 28 tablet 0   No current facility-administered medications for this visit.    Allergies:   Vioxx [rofecoxib], Spiriva respimat [tiotropium bromide monohydrate], Contrast media [iodinated contrast media], and Morphine    Social History:  The patient  reports that he quit smoking about 33 years ago. His smoking use included cigarettes. He has a 25.00 pack-year smoking history. He has never used smokeless tobacco. He reports that he does not drink alcohol and does not use drugs.   Family History:  The patient's family history includes Cancer in his sister and another family member; Diabetes in his brother and mother; Heart attack in his mother; Heart disease in his father; Polycystic kidney disease in his  father and sister; Stroke in his father and mother.    ROS:  Please see the history of present illness.   Otherwise, review of systems are positive for none.  All other systems are reviewed and negative.    PHYSICAL EXAM: VS:  BP (!) 148/62 (BP Location: Left Arm, Patient Position: Sitting, Cuff Size: Normal)   Pulse 79   Ht 6' (1.829 m)   Wt 215 lb 6 oz (97.7 kg)   SpO2 97%   BMI 29.21 kg/m  , BMI Body mass index is 29.21 kg/m. GEN: Well nourished, well developed, in no acute distress  HEENT: normal  Neck: no JVD, carotid bruits, or masses Cardiac: RRR; no  rubs, or gallops,no edema .  1/ 6 systolic ejection murmur in the aortic area Respiratory:  clear to auscultation bilaterally, normal work of breathing GI: soft, nontender, nondistended, + BS MS: no deformity or atrophy  Skin: warm and dry, no rash Neuro:  Strength and sensation are intact Psych: euthymic mood, full affect Vascular: Femoral pulses are normal.  Distal pulses are nonpalpable   EKG:  EKG is  ordered today. EKG showed normal sinus rhythm with left anterior fascicular block.  Poor R wave progression in the anterior leads.  Recent Labs: 07/08/2021: B Natriuretic Peptide 507.2 02/21/2022: ALT 10; BUN 29; Creatinine, Ser 1.42; Hemoglobin 12.3; Platelets 148.0; Potassium 4.4; Sodium 138    Lipid Panel    Component Value Date/Time   CHOL 98 02/21/2022 1144   TRIG 83.0 02/21/2022 1144   HDL 32.20 (L) 02/21/2022 1144   CHOLHDL 3 02/21/2022 1144   VLDL 16.6 02/21/2022 1144   LDLCALC 49 02/21/2022 1144   LDLDIRECT 106.0 01/01/2016 0844      Wt Readings from Last 3 Encounters:  05/19/22 215 lb 6 oz (97.7 kg)  05/13/22 212 lb (96.2 kg)  05/06/22 209 lb (94.8 kg)          No data to display            ASSESSMENT AND PLAN:  1.  Peripheral arterial disease: The patient does have bilateral calcified SFA disease worse on the right side.  Most of his symptoms are related to neuropathy and spine  issues.  No convincing symptoms of calf claudication.  Continue medical therapy.  2.  Coronary artery disease involving native coronary arteries without angina: No recent chest pain.  Lexiscan Myoview in September 2022 showed no evidence of ischemia.  3.  Hyperlipidemia: Continue treatment with atorvastatin and ezetimibe.  Most recent lipid profile in July showed an LDL of 49 which is at target.  4.  Essential hypertension: Blood pressure is controlled.  5.  Preop cardiovascular evaluation for cervical spine surgery: Currently with no anginal symptoms.  EKG is unchanged Lexiscan Myoview last year was low risk.  Thus, the patient can proceed with surgery at an overall low risk from a cardiac standpoint with no need for ischemic cardiac evaluation before surgery.    Disposition:   FU with me in 12 months for PAD.  Continue to follow-up with Dr. Rockey Situ as planned.  Signed, Kathlyn Sacramento, MD 05/19/22 [place], [place]

## 2022-05-19 NOTE — Patient Instructions (Signed)
Medication Instructions:  Your physician recommends that you continue on your current medications as directed. Please refer to the Current Medication list given to you today.  *If you need a refill on your cardiac medications before your next appointment, please call your pharmacy*   Lab Work: None ordered If you have labs (blood work) drawn today and your tests are completely normal, you will receive your results only by: [place] (if you have MyChart) OR A paper copy in the mail If you have any lab test that is abnormal or we need to change your treatment, we will call you to review the results.   Testing/Procedures: None ordered   Follow-Up: At [facility], you and your health needs are our priority.  As part of our continuing mission to provide you with exceptional heart care, we have created designated Provider Care Teams.  These Care Teams include your primary Cardiologist (physician) and Advanced Practice Providers (APPs -  Physician Assistants and Nurse Practitioners) who all work together to provide you with the care you need, when you need it.  We recommend signing up for the patient portal called "MyChart".  Sign up information is provided on this After Visit Summary.  MyChart is used to connect with patients for Virtual Visits (Telemedicine).  Patients are able to view lab/test results, encounter notes, upcoming appointments, etc.  Non-urgent messages can be sent to your provider as well.   To learn more about what you can do with MyChart, go to [email or web address].    Your next appointment:   Your physician wants you to follow-up in: 1 year You will receive a reminder letter in the mail two months in advance. If you don't receive a letter, please call our office to schedule the follow-up appointment.   The format for your next appointment:   In Person  Provider:   Kathlyn Sacramento, MD    Other Instructions [name]  Important Information About  [place]

## 2022-05-20 ENCOUNTER — Telehealth: Payer: Self-pay

## 2022-05-20 ENCOUNTER — Ambulatory Visit: Payer: PPO | Admitting: Physician Assistant

## 2022-05-20 DIAGNOSIS — G4733 Obstructive sleep apnea (adult) (pediatric): Secondary | ICD-10-CM

## 2022-05-20 NOTE — Telephone Encounter (Signed)
ONO reviewed by Dr. Raoul Pitch transient desat on cpap. Continue cpap. No added O2 for now.   Spoke to patient and relayed above message. He voiced her understanding. He would like order placed to adapt to d/c nocturnal oxygen.  Dr. Patsey Berthold, please advise if okay to order. Thanks

## 2022-05-20 NOTE — Telephone Encounter (Signed)
Lm x1 for patient.   Order is pending within this encounter. Will place order after speaking to patient.

## 2022-05-20 NOTE — Telephone Encounter (Signed)
Okay to DC O2.

## 2022-05-23 ENCOUNTER — Ambulatory Visit: Payer: PPO | Admitting: Physician Assistant

## 2022-05-23 VITALS — BP 131/71 | HR 78 | Ht 72.0 in | Wt 215.0 lb

## 2022-05-23 DIAGNOSIS — N3941 Urge incontinence: Secondary | ICD-10-CM

## 2022-05-23 MED ORDER — BCG LIVE 50 MG IS SUSR
3.2400 mL | Freq: Once | INTRAVESICAL | Status: AC
  Administered 2022-05-23: 81 mg via INTRAVESICAL

## 2022-05-23 NOTE — Progress Notes (Signed)
BCG Bladder Instillation  BCG # 3 of 3  Due to Bladder Cancer patient is present today for a BCG treatment. Patient was cleaned and prepped in a sterile fashion with betadine. A 14FR catheter was inserted, urine return was noted 1108m, urine was yellow in color.  562mof reconstituted BCG was instilled into the bladder. The catheter was then removed. Patient tolerated well, no complications were noted  Performed by: SaDebroah LoopPA-C and TaMardelle MatteCMA  Follow up/ Additional notes: 2 months for cysto with Dr. StBernardo Heater

## 2022-05-23 NOTE — Telephone Encounter (Signed)
Called and spoke with patient, advised I will send an order to adapt to discontinue the oxygen.  He verbalized understanding.  Nothing further needed.

## 2022-05-24 LAB — URINALYSIS, COMPLETE
Bilirubin, UA: NEGATIVE
Glucose, UA: NEGATIVE
Ketones, UA: NEGATIVE
Leukocytes,UA: NEGATIVE
Nitrite, UA: NEGATIVE
Protein,UA: NEGATIVE
RBC, UA: NEGATIVE
Specific Gravity, UA: 1.02 (ref 1.005–1.030)
Urobilinogen, Ur: 0.2 mg/dL (ref 0.2–1.0)
pH, UA: 5 (ref 5.0–7.5)

## 2022-05-24 LAB — MICROSCOPIC EXAMINATION: Bacteria, UA: NONE SEEN

## 2022-05-25 DIAGNOSIS — M25511 Pain in right shoulder: Secondary | ICD-10-CM | POA: Diagnosis not present

## 2022-05-26 ENCOUNTER — Telehealth: Payer: Self-pay | Admitting: Cardiovascular Disease

## 2022-05-26 NOTE — Telephone Encounter (Signed)
   Patient Name: [name]  DOB: 1941-05-15 MRN: [id number]  Primary Cardiologist: Ida Rogue, MD  Chart reviewed as part of pre-operative protocol coverage. Pre-op clearance already addressed by colleagues in earlier phone notes. To summarize recommendations:  -Preop cardiovascular evaluation for cervical spine surgery: Currently with no anginal symptoms.  EKG is unchanged Lexiscan Myoview last year was low risk.  Thus, the patient can proceed with surgery at an overall low risk from a cardiac standpoint with no need for ischemic cardiac evaluation before surgery. -Dr. [name]  Will route this bundled recommendation to requesting provider via Epic fax function and remove from pre-op pool. Please call with questions.  Elgie Collard, PA-C 05/26/2022, 4:25 PM

## 2022-05-26 NOTE — Telephone Encounter (Signed)
   Pre-operative Risk Assessment    Patient Name: [name]  DOB: [birth year] MRN: [id number]      Request for Surgical Clearance    Procedure:   Cervical Fusion  Date of Surgery:  Clearance TBD                                 Surgeon:  Dr [name] Surgeon's Group or Practice Name:  [facility] Phone number:  [phone] Fax number:  [phone]    Type of Clearance Requested:   - Medical    Type of Anesthesia:  General    Additional requests/questions:    Manfred Arch   05/26/2022, 3:48 PM

## 2022-05-27 ENCOUNTER — Other Ambulatory Visit: Payer: Self-pay | Admitting: Neurosurgery

## 2022-05-27 ENCOUNTER — Ambulatory Visit
Admission: EM | Admit: 2022-05-27 | Discharge: 2022-05-27 | Disposition: A | Discharge status: Home / Self Care | Payer: PPO | Attending: Urgent Care | Admitting: Urgent Care

## 2022-05-27 DIAGNOSIS — R062 Wheezing: Secondary | ICD-10-CM | POA: Diagnosis not present

## 2022-05-27 DIAGNOSIS — J209 Acute bronchitis, unspecified: Secondary | ICD-10-CM | POA: Diagnosis not present

## 2022-05-27 DIAGNOSIS — J439 Emphysema, unspecified: Secondary | ICD-10-CM

## 2022-05-27 MED ORDER — LEVOFLOXACIN 500 MG PO TABS: 500.0000 mg | ORAL_TABLET | Freq: Every day | ORAL | 0 refills | Status: AC

## 2022-05-27 NOTE — ED Provider Notes (Signed)
[facility]    CSN: [id number] Arrival date & time: 05/27/22  1051      History   Chief Complaint Chief Complaint  Patient presents with   Cough   Nasal Congestion    HPI [name] is a 81 y.o. male.    Cough   Patient presents to urgent care with complaint of cough and congestion.  He expresses a concern for bronchitis and endorses history of this.  Patient was seen here on 04/26/2022 with symptoms of viral URI.  He was seen 2 days later by his PCP, diagnosed with bronchitis and given doxycycline.  Unclear if the patient took this medication.  He states he was also seen by his pulmonary doctor who prescribed a course of steroids.  Patient states since he has an A1c of 6.2 at baseline, recently had cortisone injection which made his sugars go high.  Endorses an issue with hyperglycemia since treating for COVID in 2020 with daily steroids.  He states emphatically he does not want steroid treatment today.  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 06/2019 Echo (in setting of COVID): EF 35-40%, mild LVH, g1 DD, glob HK; b. 09/2019 Echo: EF 50-55%, Gr1 DD; c. 03/2021 Echo: EF 50-55%, no rwma, mild LVH, GrI DD, nl RV fxn. RVSP 44.31mHg. Mild-mod dil LA. Triv MR. Mild-mod Ao sclerosis.   AAA (abdominal aortic aneurysm) (HCC)    Actinic keratosis    Anginal pain ([facility]    BENIGN PROSTATIC HYPERTROPHY, WITH URINARY OBSTRUCTION 09/06/2007   CAD s/p CABG    a. 1990 s/p MI-->CABG x 2; b. 2002 s/p BMS to LCX; c. 06/2015 Cath: LM 50ost, LAD 100ost/p, 675m, D2 nl, LCX  patent stent, RCA 80p (small), LIMA->LAD nl, VG->D2 nl-->Med Rx; d. 05/2020 MV: fixed apical ant/apical defect. No signif ischemia; e. 04/2021 MV: EF 52%, no ischemia.   Carotid arterial disease ([facility]   a. 08/2016 Carotid U/S: <39% bilat.   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 did not tolerate CPAP.   Osteoarthritis    PAD (peripheral artery disease) ([facility]   a. 07/2017 LE duplex: RSFA [id number] 75-87m46m-74d; c. 08/2018 Periph Angio: No signif AoIliac dzs. Mod-sev Ca2+ RSFA w/ diff dzs throughout- 3 vessel runoff. Borderline signif LSFA dzs w/ mod-sev Ca2+ vessels and 3 vessel runoff below the knee-->Med rx.   Pneumonia 07/2014   [facility] hospitalization   Pneumonia due to COVID-19 virus 06/02/2019   Pre-diabetes    Prostatitis, chronic    PSA, INCREASED 07/09/2008   Spinal stenosis of lumbar region     Patient  Active Problem List   Diagnosis Date Noted   Right low back pain 12/29/2021   COPD exacerbation ([place]) 11/12/2021   Bladder cancer ([place]) 11/12/2021   Acute-on-chronic kidney injury ([place]) 07/09/2021   OSA (obstructive sleep apnea)    Acute on chronic diastolic CHF (congestive heart failure) ([place])    Right leg pain 06/22/2021   Nocturnal hypoxia 06/11/2021   CKD (chronic kidney disease) stage 3, GFR 30-59 ml/min (HCC) 02/24/2021   Peripheral neuropathy 02/23/2021   Spondylolisthesis of lumbar region 07/07/2020   COPD (chronic obstructive pulmonary disease) ([place]) 01/14/2020   Coccydynia 09/20/2019   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   Anxiety 06/04/2019   Chronic right shoulder pain 09/10/2018    Cervical stenosis of spinal canal 06/05/2017   Pedal edema 06/05/2017   Health maintenance examination 12/06/2016   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/13/2016   Overweight (BMI 25.0-29.9) 07/04/2016   Low vitamin B12 level 01/01/2016   Exertional dyspnea 07/07/2015   Medicare annual wellness visit, subsequent 07/03/2015   Advanced care planning/counseling discussion 07/03/2015   Acute recurrent frontal sinusitis 05/05/2015   Type 2 diabetes mellitus with other specified complication ([place]) 28/31/5176   Pleuritic chest pain 05/04/2015   Ex-smoker 05/04/2015   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   Spermatocele 04/02/2013   Syncopal vertigo 11/04/2010   Lumbar disc disease with radiculopathy 11/04/2010   CAD, ARTERY BYPASS GRAFT 08/11/2009   PAD (peripheral artery disease) ([place]) 08/11/2009   DUPUYTREN'S CONTRACTURE, 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   Carotid stenosis 07/09/2008   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   Benign prostatic hyperplasia with urinary obstruction 09/06/2007   Essential hypertension 04/30/2007   GERD 04/30/2007   Hyperlipidemia 04/26/2007   OA (osteoarthritis) of knee 04/26/2007   DVT, HX OF 04/26/2007    Past Surgical History:  Procedure Laterality Date   ABDOMINAL AORTOGRAM W/LOWER EXTREMITY [name] 09/12/2018   Procedure: ABDOMINAL AORTOGRAM W/LOWER EXTREMITY;  Surgeon: [name], MD;  Location: [place] [facility];  Service: Cardiovascular;  Laterality: [name];   APPENDECTOMY     rupture   BACK SURGERY     1984 and then [facility]   BLADDER INSTILLATION [name] 10/19/2021   Procedure: BLADDER INSTILLATION OF GEMCITABINE;  Surgeon: Abbie Sons, MD;  Location: [facility];  Service: Urology;  Laterality: [name];   CARDIAC CATHETERIZATION [name] 07/07/2015   Procedure: Left Heart Cath and Cors/Grafts Angiography;  Surgeon: [name], MD;  Location: [place] [facility];  Service: Cardiovascular;  Laterality: [name];   CATARACT EXTRACTION, BILATERAL  2009    CERVICAL SPINE SURGERY  12/2016   cervical stenosis [name])   COLONOSCOPY  03/2010   HP polyp, diverticulosis, rpt 10 yrs ([name])   CORONARY ANGIOPLASTY  11/30/2000   LCX   CORONARY ARTERY BYPASS GRAFT  1990   3 vessel    CYSTOSCOPY  12/23/2010   [facility]   EYE SURGERY     cataract   JOINT REPLACEMENT     KNEE ARTHROSCOPY Right    x2   LAMINOTOMY  1986   L5/S1 lumbar laminotomy for two ruptured discs/fusion   LUMBAR LAMINECTOMY/DECOMPRESSION MICRODISCECTOMY [name] 07/13/2016   Procedure: LUMBAR TWO-THREE, LUMBAR THREE-FOUR, LUMBAR FOUR-FIVE LAMINECTOMY AND FORAMINOTOMY;  Surgeon: [name], MD;  Location: [place];  Service: Neurosurgery;  Laterality: [name];  LAMINECTOMY AND FORAMINOTOMY L2-L3, L3-L4,L4-L5   LUMBAR SPINE SURGERY  06/2020   Bilateral redo laminectomy/laminotomy/foraminotomies/medial facetectomy to decompress the bilateral L4 and L5 nerve roots [name])   TOTAL HIP ARTHROPLASTY Bilateral  [id number]   TOTAL KNEE ARTHROPLASTY Right 03/06/2017   Procedure: RIGHT TOTAL KNEE ARTHROPLASTY;  Surgeon: Gaynelle Arabian, MD;  Location: [facility];  Service: Orthopedics;  Laterality: Right;   TRANSURETHRAL RESECTION OF BLADDER TUMOR [name] 10/19/2021   Procedure: TRANSURETHRAL RESECTION OF BLADDER TUMOR (TURBT);  Surgeon: Abbie Sons, MD;  Location: [facility];  Service: Urology;  Laterality: [name];       Home Medications    Prior to Admission medications   Medication Sig Start Date End Date Taking? Authorizing Provider  acetaminophen (TYLENOL) 500 MG tablet Take 1,000 mg by mouth every 8 (eight) hours as needed for moderate pain.    [provider]  aspirin EC 81 MG EC tablet Take 1 tablet (81 mg total) by mouth daily. 06/26/19   Elgergawy, Silver Huguenin, MD  atorvastatin (LIPITOR) 20 MG tablet TAKE 1 TABLET BY MOUTH DAILY. 01/17/22   [name], MD  Azelastine HCl (ASTEPRO) 0.15 % SOLN Place 1 spray into the nose at bedtime as needed (Sinus congestion, allergies). 02/03/22    Chesley Mires, MD  cyclobenzaprine (FLEXERIL) 5 MG tablet Take 1 tablet (5 mg total) by mouth 2 (two) times daily as needed for muscle spasms (sedation precautions). 12/29/21   Ria Bush, MD  docusate sodium (COLACE) 100 MG capsule Take 100 mg by mouth 2 (two) times daily as needed for mild constipation.    [provider]  ezetimibe (ZETIA) 10 MG tablet TAKE 1 TABLET BY MOUTH ONCE DAILY. 04/15/22   [name], MD  fluticasone (FLONASE) 50 MCG/ACT nasal spray Place 1 spray into both nostrils as needed for allergies or rhinitis.    [provider]  furosemide (LASIX) 20 MG tablet Take 1 tablet by mouth every third day as directed. 02/14/22   [name], MD  gabapentin (NEURONTIN) 600 MG tablet Take 600 mg by mouth 3 (three) times daily.    [provider]  ipratropium-albuterol (DUONEB) 0.5-2.5 (3) MG/3ML SOLN Take 3 mLs by nebulization every 6 (six) hours as needed. 07/06/21   Tyler Pita, MD  isosorbide mononitrate (IMDUR) 30 MG 24 hr tablet Take 0.5 tablets (15 mg total) by mouth daily. 01/13/22   Furth, Cadence H, PA-C  losartan (COZAAR) 25 MG tablet TAKE 1 TABLET BY MOUTH DAILY 04/29/22   [name], MD  Nebulizers (COMPRESSOR/NEBULIZER) MISC Use as directed. 07/06/21   Tyler Pita, MD  nitroGLYCERIN (NITROSTAT) 0.4 MG SL tablet Place 1 tablet (0.4 mg total) under the tongue every 5 (five) minutes as needed for chest pain. 05/01/21   Ria Bush, MD  OVER THE COUNTER MEDICATION Apply 1 application topically 2 (two) times daily as needed (pain). Cryotherapy roll on gel    [provider]  OXYGEN Inhale 2 L into the lungs at bedtime.    [provider]  pantoprazole (PROTONIX) 40 MG tablet Take 40 mg by mouth 2 (two) times daily. 07/16/21   [provider]  polyethylene glycol (MIRALAX / GLYCOLAX) 17 g packet Take 17 g by mouth daily as needed for mild constipation.     [provider]  PROAIR HFA  [phone] Base) MCG/ACT inhaler TAKE 2 PUFFS INTO LUNGS EVERY 6 HOURS ASNEEDED FOR WHEEZING OR SHORTNESS OF BREATH 02/19/21   Ria Bush, MD  ranolazine (RANEXA) 500 MG 12 hr tablet TAKE ONE (1) TABLET BY MOUTH TWO TIMES PER DAY 04/19/22   [name], MD  tamsulosin (FLOMAX) 0.4 MG CAPS capsule TAKE 1  CAPSULE BY MOUTH AT BEDTIME 03/15/22   Stoioff, Ronda Fairly, MD  Vibegron (GEMTESA) 75 MG TABS Take 75 mg by mouth daily. 05/13/22   Debroah Loop, PA-C    Family History Family History  Problem Relation Age of Onset   Stroke Mother    Heart attack Mother    Diabetes Mother    Stroke Father    Heart disease Father    Polycystic kidney disease Father    Cancer Sister        throat   Polycystic kidney disease Sister    Diabetes Brother        1/2 brother   Cancer Other        5/7 nephews with lung cancer    Social History Social History   Tobacco Use   Smoking status: Former    Packs/day: 1.00    Years: 25.00    Total pack years: 25.00    Types: Cigarettes    Quit date: 08/15/1988    Years since quitting: 33.8   Smokeless tobacco: Never  Vaping Use   Vaping Use: Never used  Substance Use Topics   Alcohol use: No    Alcohol/week: 0.0 standard drinks of alcohol   Drug use: No     Allergies   Vioxx [rofecoxib], Spiriva respimat [tiotropium bromide monohydrate], Contrast media [iodinated contrast media], and Morphine   Review of Systems Review of Systems  Respiratory:  Positive for cough.      Physical Exam Triage Vital Signs ED Triage Vitals  Enc Vitals Group     BP 05/27/22 1110 (!) 156/75     Pulse Rate 05/27/22 1110 80     Resp 05/27/22 1110 18     Temp 05/27/22 1110 97.9 F (36.6 C)     Temp Source 05/27/22 1110 Oral     SpO2 05/27/22 1110 96 %     Weight --      Height --      Head Circumference --      Peak Flow --      Pain Score 05/27/22 1113 0     Pain Loc --      Pain Edu? --      Excl. in [place]? --    No data found.  Updated Vital  Signs BP (!) 156/75 (BP Location: Left Arm)   Pulse 80   Temp 97.9 F (36.6 C) (Oral)   Resp 18   SpO2 96%   Visual Acuity Right Eye Distance:   Left Eye Distance:   Bilateral Distance:    Right Eye Near:   Left Eye Near:    Bilateral Near:     Physical Exam Vitals reviewed.  Constitutional:      Appearance: Normal appearance.  HENT:     Head: Normocephalic.  Cardiovascular:     Rate and Rhythm: Normal rate and regular rhythm.  Pulmonary:     Effort: Pulmonary effort is normal.     Breath sounds: Decreased air movement present. Wheezing and rhonchi present.  Skin:    General: Skin is warm and dry.  Neurological:     General: No focal deficit present.     Mental Status: He is alert and oriented to person, place, and time.  Psychiatric:        Mood and Affect: Mood normal.        Behavior: Behavior normal.      UC Treatments / Results  Labs (all labs ordered are listed, but only abnormal results  are displayed) Labs Reviewed - No data to display  EKG   Radiology No results found.  Procedures Procedures (including critical care time)  Medications Ordered in UC Medications - No data to display  Initial Impression / Assessment and Plan / UC Course  I have reviewed the triage vital signs and the nursing notes.  Pertinent labs & imaging results that were available during my care of the patient were reviewed by me and considered in my medical decision making (see chart for details).   Concern for bronchitis versus COPD exacerbation.  Will treat with Levaquin 500 mg x 5 days and asked him to follow-up with his pulmonary specialist.   Final Clinical Impressions(s) / UC Diagnoses   Final diagnoses:  None   Discharge Instructions   None    ED Prescriptions   None    PDMP not reviewed this encounter.   Rose Phi, [place] 05/27/22 1138

## 2022-05-27 NOTE — Discharge Instructions (Addendum)
Follow up with your primary care provider and pulmonary specialist if your symptoms are worsening or not improving with treatment.

## 2022-05-27 NOTE — ED Triage Notes (Signed)
Pt. Present to UC w/ c/o a cough and congestion. Pt. Expresses concern for Bronchitis that he has a hx of. Pt. Also mentions having a recent cortisone shot for an unrelated health concern.

## 2022-06-03 ENCOUNTER — Encounter: Payer: Self-pay | Admitting: Pulmonary Disease

## 2022-06-06 ENCOUNTER — Ambulatory Visit (INDEPENDENT_AMBULATORY_CARE_PROVIDER_SITE_OTHER): Payer: PPO | Admitting: Family Medicine

## 2022-06-06 ENCOUNTER — Ambulatory Visit (INDEPENDENT_AMBULATORY_CARE_PROVIDER_SITE_OTHER)
Admission: RE | Admit: 2022-06-06 | Discharge: 2022-06-06 | Disposition: A | Discharge status: Home / Self Care | Payer: PPO | Source: Ambulatory Visit | Attending: Family Medicine | Admitting: Family Medicine

## 2022-06-06 ENCOUNTER — Encounter: Payer: Self-pay | Admitting: Family Medicine

## 2022-06-06 VITALS — BP 144/64 | HR 84 | Temp 97.7°F | Ht 72.0 in | Wt 217.2 lb

## 2022-06-06 DIAGNOSIS — J440 Chronic obstructive pulmonary disease with acute lower respiratory infection: Secondary | ICD-10-CM

## 2022-06-06 DIAGNOSIS — J441 Chronic obstructive pulmonary disease with (acute) exacerbation: Secondary | ICD-10-CM

## 2022-06-06 DIAGNOSIS — R059 Cough, unspecified: Secondary | ICD-10-CM | POA: Diagnosis not present

## 2022-06-06 DIAGNOSIS — R062 Wheezing: Secondary | ICD-10-CM | POA: Diagnosis not present

## 2022-06-06 DIAGNOSIS — J209 Acute bronchitis, unspecified: Secondary | ICD-10-CM | POA: Diagnosis not present

## 2022-06-06 MED ORDER — PREDNISONE 10 MG PO TABS: ORAL_TABLET | ORAL | 0 refills | Status: DC

## 2022-06-06 MED ORDER — DOXYCYCLINE HYCLATE 100 MG PO TABS: 100.0000 mg | ORAL_TABLET | Freq: Two times a day (BID) | ORAL | 0 refills | Status: DC

## 2022-06-06 NOTE — Patient Instructions (Addendum)
Chest xray now  We will call with result   Take prednisone as directed  Continue the robitussin   Continue your inhaler and your nebulizer

## 2022-06-06 NOTE — Progress Notes (Signed)
Subjective:    Patient ID: [name], male    DOB: 1940/12/29, 81 y.o.   MRN: [id number]  HPI Pt presents with symptoms of bronchitis  H/o COPD and vasc dz and DM 2 and OSA Wt Readings from Last 3 Encounters:  06/06/22 217 lb 4 oz (98.5 kg)  05/23/22 215 lb (97.5 kg)  05/19/22 215 lb 6 oz (97.7 kg)   29.46 kg/m  [place]  [place] but not all the way Got worse last night   Cough is prod green phlegm Wheezing last night - improved this am   Used albuterol mdi this am  Duo neb - every 6 hours    Wife has had bronchitis in 3 weeks    Did covid test last week- was negative     Took robitussin and hydrocodone to sleep  Tessalon pills    Had covid in 2020- made him sick ever since    Was seen at [facility] on 10/13 for cough and congestion with viral uri  Was tx with doxycycline prior and steroids from pulmonology  Still symptomatic at that time  Was tx with levaquin 500 mg daily for 5 d   Pulse ox 93% on RA Used to be on 02, not now  Was 95-96% this am    Former smoker  Has h/o COPD  Sees ENT for recurrent sinusitis   Reassuring cxr  DG Chest 2 View  Result Date: 06/06/2022 CLINICAL DATA:  Productive cough, wheezing EXAM: CHEST - 2 VIEW COMPARISON:  04/26/2022 FINDINGS: Transverse diameter of heart is slightly increased. Metallic sutures are seen in the sternum. There are no signs of pulmonary edema or focal pulmonary consolidation. There is no pleural effusion or pneumothorax. IMPRESSION: There are no new infiltrates or signs of pulmonary edema. Electronically Signed   By: Elmer Picker M.D.   On: 06/06/2022 12:58     Patient Active Problem List   Diagnosis Date Noted   Acute bronchitis with COPD ([place]) 06/06/2022   Right low back pain 12/29/2021   COPD exacerbation ([place]) 11/12/2021   Bladder cancer ([place]) 11/12/2021   Acute-on-chronic kidney injury ([place]) 07/09/2021   OSA (obstructive sleep apnea)    Acute on chronic  diastolic CHF (congestive heart failure) ([place])    Right leg pain 06/22/2021   Nocturnal hypoxia 06/11/2021   CKD (chronic kidney disease) stage 3, GFR 30-59 ml/min ([place]) 02/24/2021   Peripheral neuropathy 02/23/2021   Spondylolisthesis of lumbar region 07/07/2020   COPD (chronic obstructive pulmonary disease) ([place]) 01/14/2020   Coccydynia 09/20/2019   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   Anxiety 06/04/2019   Chronic right shoulder pain 09/10/2018   Cervical stenosis of spinal canal 06/05/2017   Pedal edema 06/05/2017   Health maintenance examination 12/06/2016   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/13/2016   Overweight (BMI 25.0-29.9) 07/04/2016   Low vitamin B12 level 01/01/2016   Exertional dyspnea 07/07/2015   Medicare annual wellness visit, subsequent 07/03/2015   Advanced care planning/counseling discussion 07/03/2015   Acute recurrent frontal sinusitis 05/05/2015   Type 2 diabetes mellitus with other specified complication ([place]) 40/98/1191   Pleuritic chest pain 05/04/2015   Ex-smoker 05/04/2015   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   Spermatocele 04/02/2013   Syncopal vertigo 11/04/2010   Lumbar disc disease with radiculopathy 11/04/2010   CAD, ARTERY BYPASS GRAFT 08/11/2009   PAD (peripheral artery disease) ([place]) 08/11/2009   DUPUYTREN'S CONTRACTURE, 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 06/2019 Echo (in setting of COVID): EF 35-40%, mild LVH, g1 DD, glob HK; b. 09/2019 Echo: EF 50-55%, Gr1 DD; c. 03/2021 Echo: EF 50-55%, no rwma, mild LVH, GrI DD, nl RV fxn. RVSP 44.79mHg.  Mild-mod dil LA. Triv MR. Mild-mod Ao sclerosis.   AAA (abdominal aortic aneurysm) (HCC)    Actinic keratosis    Anginal pain ([facility]    BENIGN PROSTATIC HYPERTROPHY, WITH URINARY OBSTRUCTION 09/06/2007   CAD s/p CABG    a. 1990 s/p MI-->CABG x 2; b. 2002 s/p BMS to LCX; c. 06/2015 Cath: LM 50ost, LAD 100ost/p, 663m, D2 nl, LCX  patent stent, RCA 80p (small), LIMA->LAD nl, VG->D2 nl-->Med Rx; d. 05/2020 MV: fixed apical ant/apical defect. No signif ischemia; e. 04/2021 MV: EF 52%, no ischemia.   Carotid arterial disease ([place]   a. 08/2016 Carotid U/S: <39% bilat.   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 did not tolerate CPAP.   Osteoarthritis    PAD (peripheral artery disease) ([facility]   a. 07/2017 LE duplex: RSFA [id number] 75-82m109m-74d; c. 08/2018 Periph Angio: No signif AoIliac dzs. Mod-sev Ca2+ RSFA w/ diff dzs throughout- 3 vessel runoff. Borderline signif LSFA dzs w/ mod-sev Ca2+ vessels and 3 vessel runoff below the knee-->Med rx.   Pneumonia 07/2014   [facility] hospitalization   Pneumonia due to COVID-19 virus 06/02/2019   Pre-diabetes    Prostatitis, chronic    PSA, INCREASED 07/09/2008   Spinal stenosis of lumbar region    Past Surgical History:  Procedure Laterality Date   ABDOMINAL  AORTOGRAM W/LOWER EXTREMITY [name] 09/12/2018   Procedure: ABDOMINAL AORTOGRAM W/LOWER EXTREMITY;  Surgeon: AridWellington Hampshire;  [facility];  Service: Cardiovascular;  Laterality: [name];   APPENDECTOMY     rupture   BACK SURGERY     1984 and then [facility]   BLADDER INSTILLATION [name] 10/19/2021   Procedure: BLADDER INSTILLATION OF GEMCITABINE;  Surgeon: StoiAbbie Sons;  Location: [facility] ORS;  Service: Urology;  Laterality: [name];   CARDIAC CATHETERIZATION [name] 07/07/2015   Procedure: Left Heart Cath and Cors/Grafts Angiography;  Surgeon: TimoMinna Merritts;  [facility];  Service: Cardiovascular;  Laterality: [name];   CATARACT EXTRACTION, BILATERAL  2009   CERVICAL SPINE SURGERY  12/2016  cervical stenosis [name])   COLONOSCOPY  03/2010   HP polyp, diverticulosis, rpt 10 yrs ([name])   CORONARY ANGIOPLASTY  11/30/2000   [facility]   CORONARY ARTERY BYPASS GRAFT  1990   3 vessel    CYSTOSCOPY  12/23/2010   [facility]   EYE SURGERY     cataract   JOINT REPLACEMENT     KNEE ARTHROSCOPY Right    x2   LAMINOTOMY  1986   L5/S1 lumbar laminotomy for two ruptured discs/fusion   LUMBAR LAMINECTOMY/DECOMPRESSION MICRODISCECTOMY [name] 07/13/2016   Procedure: LUMBAR TWO-THREE, LUMBAR THREE-FOUR, LUMBAR FOUR-FIVE LAMINECTOMY AND FORAMINOTOMY;  Surgeon: [name], MD;  Location: [facility];  Service: Neurosurgery;  Laterality: [name];  LAMINECTOMY AND FORAMINOTOMY L2-L3, L3-L4,L4-L5   LUMBAR SPINE SURGERY  06/2020   Bilateral redo laminectomy/laminotomy/foraminotomies/medial facetectomy to decompress the bilateral L4 and L5 nerve roots [name])   TOTAL HIP ARTHROPLASTY Bilateral 1950,9326   TOTAL KNEE ARTHROPLASTY Right 03/06/2017   Procedure: RIGHT TOTAL KNEE ARTHROPLASTY;  Surgeon: Gaynelle Arabian, MD;  Location: [facility];  Service: Orthopedics;  Laterality: Right;   TRANSURETHRAL RESECTION OF BLADDER TUMOR [name] 10/19/2021   Procedure: TRANSURETHRAL RESECTION OF BLADDER  TUMOR (TURBT);  Surgeon: Abbie Sons, MD;  Location: [facility] OR[facility];  Service: Urology;  Laterality: [name];   Social History   Tobacco Use   Smoking status: Former    Packs/day: 1.00    Years: 25.00    Total pack years: 25.00    Types: Cigarettes    Quit date: 08/15/1988    Years since quitting: 33.8   Smokeless tobacco: Never  Vaping Use   Vaping Use: Never used  Substance Use Topics   Alcohol use: No    Alcohol/week: 0.0 standard drinks of alcohol   Drug use: No   Family History  Problem Relation Age of Onset   Stroke Mother    Heart attack Mother    Diabetes Mother    Stroke Father    Heart disease Father    Polycystic kidney disease Father    Cancer Sister        throat   Polycystic kidney disease Sister    Diabetes Brother        1/2 brother   Cancer Other        5/7 nephews with lung cancer   Allergies  Allergen Reactions   Vioxx [Rofecoxib] Other (See Comments)    Hemorrhage    Spiriva Respimat [Tiotropium Bromide Monohydrate] Other (See Comments)    Elevated bp   Contrast Media [Iodinated Contrast Media] Itching and Rash    Delayed reaction post abdominal aortagram.    Morphine Nausea Only and Other (See Comments)    Irritability    Current Outpatient Medications on File Prior to Visit  Medication Sig Dispense Refill   acetaminophen (TYLENOL) 500 MG tablet Take 1,000 mg by mouth every 8 (eight) hours as needed for moderate pain.     aspirin EC 81 MG EC tablet Take 1 tablet (81 mg total) by mouth daily. 30 tablet 0   atorvastatin (LIPITOR) 20 MG tablet TAKE 1 TABLET BY MOUTH DAILY. 90 tablet 3   Azelastine HCl (ASTEPRO) 0.15 % SOLN Place 1 spray into the nose at bedtime as needed (Sinus congestion, allergies). 30 mL 11   cyclobenzaprine (FLEXERIL) 5 MG tablet Take 1 tablet (5 mg total) by mouth 2 (two) times daily as needed for muscle spasms (sedation precautions). 20 tablet 0   docusate sodium (COLACE) 100 MG capsule Take 100 mg  by mouth 2 (two) times daily  as needed for mild constipation.     ezetimibe (ZETIA) 10 MG tablet TAKE 1 TABLET BY MOUTH ONCE DAILY. 90 tablet 2   fluticasone (FLONASE) 50 MCG/ACT nasal spray Place 1 spray into both nostrils as needed for allergies or rhinitis.     furosemide (LASIX) 20 MG tablet Take 1 tablet by mouth every third day as directed. 30 tablet 2   gabapentin (NEURONTIN) 600 MG tablet Take 600 mg by mouth 3 (three) times daily.     ipratropium-albuterol (DUONEB) 0.5-2.5 (3) MG/3ML SOLN Take 3 mLs by nebulization every 6 (six) hours as needed. 360 mL 11   isosorbide mononitrate (IMDUR) 30 MG 24 hr tablet Take 0.5 tablets (15 mg total) by mouth daily. 45 tablet 3   losartan (COZAAR) 25 MG tablet TAKE 1 TABLET BY MOUTH DAILY 90 tablet 2   Nebulizers (COMPRESSOR/NEBULIZER) MISC Use as directed. 1 each 0   nitroGLYCERIN (NITROSTAT) 0.4 MG SL tablet Place 1 tablet (0.4 mg total) under the tongue every 5 (five) minutes as needed for chest pain. 25 tablet 3   OVER THE COUNTER MEDICATION Apply 1 application topically 2 (two) times daily as needed (pain). Cryotherapy roll on gel     pantoprazole (PROTONIX) 40 MG tablet Take 40 mg by mouth 2 (two) times daily.     polyethylene glycol (MIRALAX / GLYCOLAX) 17 g packet Take 17 g by mouth daily as needed for mild constipation.      PROAIR HFA 108 (90 Base) MCG/ACT inhaler TAKE 2 PUFFS INTO LUNGS EVERY 6 HOURS ASNEEDED FOR WHEEZING OR SHORTNESS OF BREATH 8.5 g 6   ranolazine (RANEXA) 500 MG 12 hr tablet TAKE ONE (1) TABLET BY MOUTH TWO TIMES PER DAY 180 tablet 3   tamsulosin (FLOMAX) 0.4 MG CAPS capsule TAKE 1 CAPSULE BY MOUTH AT BEDTIME 90 capsule 1   Vibegron (GEMTESA) 75 MG TABS Take 75 mg by mouth daily. 28 tablet 0   No current facility-administered medications on file prior to visit.    Review of Systems  Constitutional:  Negative for activity change, appetite change, fatigue, fever and unexpected weight change.  HENT:  Positive for postnasal drip. Negative for  congestion, rhinorrhea, sore throat and trouble swallowing.   Eyes:  Negative for pain, redness, itching and visual disturbance.  Respiratory:  Positive for cough, chest tightness, shortness of breath and wheezing. Negative for stridor.   Cardiovascular:  Negative for chest pain and palpitations.  Gastrointestinal:  Negative for abdominal pain, blood in stool, constipation, diarrhea and nausea.  Endocrine: Negative for cold intolerance, heat intolerance, polydipsia and polyuria.  Genitourinary:  Negative for difficulty urinating, dysuria, frequency and urgency.  Musculoskeletal:  Negative for arthralgias, joint swelling and myalgias.  Skin:  Negative for pallor and rash.  Neurological:  Negative for dizziness, tremors, weakness, numbness and headaches.  Hematological:  Negative for adenopathy. Does not bruise/bleed easily.  Psychiatric/Behavioral:  Negative for decreased concentration and dysphoric mood. The patient is not nervous/anxious.        Objective:   Physical Exam Constitutional:      General: He is not in acute distress.    Appearance: Normal appearance. He is well-developed. He is not ill-appearing or diaphoretic.  HENT:     Head: Normocephalic and atraumatic.     Right Ear: Tympanic membrane, ear canal and external ear normal.     Left Ear: Tympanic membrane, ear canal and external ear normal.     Nose:  Comments: Boggy nares     Mouth/Throat:     Mouth: Mucous membranes are moist.     Pharynx: Oropharynx is clear. No oropharyngeal exudate or posterior oropharyngeal erythema.  Eyes:     General:        Right eye: No discharge.        Left eye: No discharge.     Conjunctiva/sclera: Conjunctivae normal.     Pupils: Pupils are equal, round, and reactive to light.  Neck:     Thyroid: No thyromegaly.     Vascular: No carotid bruit or JVD.  Cardiovascular:     Rate and Rhythm: Normal rate and regular rhythm.     Heart sounds: Normal heart sounds.     No gallop.   Pulmonary:     Effort: Pulmonary effort is normal. No respiratory distress.     Breath sounds: Normal breath sounds. No wheezing or rales.     Comments: Diffusely distant bs Scattered rhonchi No rales Scant wheeze on forced exp (exp time is slt prolonged)   No crackles  Abdominal:     General: There is no distension or abdominal bruit.     Palpations: Abdomen is soft.  Musculoskeletal:     Cervical back: Normal range of motion and neck supple.     Right lower leg: No edema.     Left lower leg: No edema.  Lymphadenopathy:     Cervical: No cervical adenopathy.  Skin:    General: Skin is warm and dry.     Coloration: Skin is not pale.     Findings: No rash.  Neurological:     Mental Status: He is alert.     Coordination: Coordination normal.     Deep Tendon Reflexes: Reflexes are normal and symmetric. Reflexes normal.  Psychiatric:        Mood and Affect: Mood normal.           Assessment & Plan:   Problem List Items Addressed This Visit       Respiratory   Acute bronchitis with COPD ([place]) - Primary    Reviewed last note and UC note and last chest imaging and pulmonary hx (h/o severe covid 2020, prior smoker and firefighter with copd) Today rhonchi and some wheeze after last tx (levaquin) at [facility] on 10/13  Did improve and worsen again  Pulse ox 93% (per pt had 02 in past)  Disc symptom care  Disc ER precautions Continues robitussin  Has hydrocodone at home In past tessalon does not help  CXR ordered -reassuring, no infiltrate        Relevant Medications   predniSONE (DELTASONE) 10 MG tablet   Other Relevant Orders   DG Chest 2 View (Completed)   COPD exacerbation ([place])   Relevant Medications   predniSONE (DELTASONE) 10 MG tablet   Other Relevant Orders   DG Chest 2 View (Completed)

## 2022-06-06 NOTE — Assessment & Plan Note (Addendum)
Reviewed last note and UC note and last chest imaging and pulmonary hx (h/o severe covid 2020, prior smoker and firefighter with copd) Today rhonchi and some wheeze after last tx (levaquin) at [facility] on 10/13  Did improve and worsen again  Pulse ox 93% (per pt had 02 in past)  Disc symptom care  Disc ER precautions Continues robitussin  Has hydrocodone at home In past tessalon does not help  CXR ordered -reassuring, no infiltrate  Given inc sputum with color change will add doxycycline  Plan on close f/u with pcp and /or pulmonologist  He has neck surgery planned next mo as well

## 2022-06-07 ENCOUNTER — Telehealth: Payer: Self-pay | Admitting: Pulmonary Disease

## 2022-06-07 NOTE — Telephone Encounter (Signed)
Order was placed to [employer] 05/23/2022 to d/c nocturnal oxygen. Machine has not been picked up.  Rodena Piety, can you help with this? Thanks

## 2022-06-07 NOTE — Telephone Encounter (Signed)
Urgent message sent to [employer] asking them to check on this issue

## 2022-06-08 ENCOUNTER — Other Ambulatory Visit: Payer: Self-pay | Admitting: Neurosurgery

## 2022-06-09 NOTE — Telephone Encounter (Signed)
Patient states oxygen equipment has not been picked up. Checking on message sent. Patient phone number is [phone].

## 2022-06-09 NOTE — Telephone Encounter (Signed)
Patient is aware that a message has bene sent to [employer] for update.  He would like oxygen picked up ASAP, as he does not want to continue to be billed for this.   [name] Piety, do you have an update on this?

## 2022-06-09 NOTE — Telephone Encounter (Signed)
I have received new response from  [facility] with [employer]  I resent the request -- the last day he will be billed for is the 10th. A delivery driver will call to set up pick up time.

## 2022-06-10 ENCOUNTER — Encounter: Payer: Self-pay | Admitting: Physician Assistant

## 2022-06-10 ENCOUNTER — Ambulatory Visit: Payer: PPO | Admitting: Physician Assistant

## 2022-06-10 ENCOUNTER — Telehealth: Payer: Self-pay | Admitting: *Deleted

## 2022-06-10 VITALS — BP 157/76 | HR 82 | Ht 72.0 in | Wt 219.0 lb

## 2022-06-10 DIAGNOSIS — N3941 Urge incontinence: Secondary | ICD-10-CM | POA: Diagnosis not present

## 2022-06-10 LAB — BLADDER SCAN AMB NON-IMAGING

## 2022-06-10 MED ORDER — TROSPIUM CHLORIDE 20 MG PO TABS: 20.0000 mg | ORAL_TABLET | Freq: Two times a day (BID) | ORAL | 2 refills | Status: DC

## 2022-06-10 MED ORDER — TROSPIUM CHLORIDE ER 60 MG PO CP24: 1.0000 | ORAL_CAPSULE | Freq: Every day | ORAL | 2 refills | Status: DC

## 2022-06-10 NOTE — Telephone Encounter (Signed)
Ok, I sent in the IR form of trospium to [facility], should be cheaper. If still too expensive, will resume tolterodine.

## 2022-06-10 NOTE — Telephone Encounter (Signed)
Pt called back and stated his co-pay for trospium is $100.00 at [facility], pt would like to try something else

## 2022-06-10 NOTE — Telephone Encounter (Signed)
Left message on machine with detailed results, advised tocall if any questions.

## 2022-06-10 NOTE — Progress Notes (Signed)
06/10/2022 9:31 AM   [name] 06/15/1941 [id number]  CC: Chief Complaint  Patient presents with   Follow-up    4 week follow-up    HPI: [name] is a 81 y.o. 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   Today he reports no improvement in his urinary symptoms on Gemtesa versus tolterodine.  He states he cannot afford the Gemtesa, with an estimated co-pay of $100 per month.  IPSS 6/mostly satisfied as below.  PVR 228m.   IPSS     Row Name 06/10/22 0900         International Prostate Symptom Score   How often have you had the sensation of not emptying your bladder? Less than half the time     How often have you found you stopped and started again several times when you urinated? Less than 1 in 5 times     How often have you had a weak urinary stream? Less than 1 in 5 times     How often have you had to strain to start urination? Not at All     How many times did you typically get up at night to urinate? 2 Times     Total IPSS Score 6       Quality of Life due to urinary symptoms   If you were to spend the rest of your life with your urinary condition just the way it is now how would you feel about that? 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 06/2019 Echo (in setting of COVID): EF 35-40%, mild LVH, g1 DD, glob HK; b. 09/2019 Echo: EF 50-55%, Gr1 DD; c. 03/2021 Echo: EF 50-55%, no rwma, mild LVH, GrI DD, nl RV fxn. RVSP 44.729mg. Mild-mod dil LA. Triv MR. Mild-mod Ao sclerosis.   AAA (abdominal aortic aneurysm) (HCC)    Actinic keratosis    Anginal pain (HCVern[place]   BENIGN PROSTATIC HYPERTROPHY, WITH URINARY OBSTRUCTION 09/06/2007   CAD s/p CABG    a. 1990 s/p MI-->CABG x 2; b. 2002 s/p BMS to LCX; c. 06/2015 Cath: LM 50ost, LAD 100ost/p, 6041m D2 nl,  LCX  patent stent, RCA 80p (small), LIMA->LAD nl, VG->D2 nl-->Med Rx; d. 05/2020 MV: fixed apical ant/apical defect. No signif ischemia; e. 04/2021 MV: EF 52%, no ischemia.   Carotid arterial disease ([place]  a. 08/2016 Carotid U/S: <39% bilat.   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 did not tolerate CPAP.   Osteoarthritis    PAD (peripheral artery disease) ([place])    a. 07/2017 LE duplex: RSFA 75-29m LSFA 75-972m50-74d; c. 08/2018 Periph Angio: No signif AoIliac dzs. Mod-sev Ca2+ RSFA w/ diff dzs throughout- 3 vessel runoff. Borderline signif LSFA dzs w/ mod-sev Ca2+ vessels and 3 vessel runoff below the knee-->Med rx.   Pneumonia 07/2014   [facility] hospitalization   Pneumonia due to COVID-19 virus 06/02/2019   Pre-diabetes    Prostatitis, chronic    PSA, INCREASED 07/09/2008   Spinal stenosis of lumbar region     Surgical History: Past Surgical History:  Procedure Laterality Date   ABDOMINAL AORTOGRAM W/LOWER EXTREMITY [name] 09/12/2018   Procedure: ABDOMINAL AORTOGRAM W/LOWER EXTREMITY;  Surgeon: ArWellington HampshireMD;  [facility];  Service: Cardiovascular;  Laterality: [name];   APPENDECTOMY     rupture   BACK SURGERY     1984 and then [facility]   BLADDER INSTILLATION  [name] 10/19/2021   Procedure: BLADDER INSTILLATION OF GEMCITABINE;  Surgeon: StAbbie SonsMD;  Location: [facility];  Service: Urology;  Laterality: [name];   CARDIAC CATHETERIZATION [name] 07/07/2015   Procedure: Left Heart Cath and Cors/Grafts Angiography;  Surgeon: TiMinna MerrittsMD;  [facility];  Service: Cardiovascular;  Laterality: [name];   CATARACT EXTRACTION, BILATERAL  2009   CERVICAL SPINE SURGERY  12/2016   cervical stenosis (JArnoldo [name]  COLONOSCOPY  03/2010   HP polyp, diverticulosis, rpt 10 yrs ([name])   CORONARY ANGIOPLASTY  11/30/2000   LCX   CORONARY ARTERY BYPASS GRAFT  1990   3 vessel    CYSTOSCOPY  12/23/2010   [facility]   EYE SURGERY     cataract   JOINT REPLACEMENT     KNEE ARTHROSCOPY Right    x2   LAMINOTOMY  1986   L5/S1 lumbar laminotomy for two ruptured discs/fusion   LUMBAR LAMINECTOMY/DECOMPRESSION MICRODISCECTOMY [name] 07/13/2016   Procedure: LUMBAR TWO-THREE, LUMBAR THREE-FOUR, LUMBAR FOUR-FIVE LAMINECTOMY AND FORAMINOTOMY;  Surgeon: JeNewman PiesMD;  [facility] Service: Neurosurgery;  Laterality: [name];  LAMINECTOMY AND FORAMINOTOMY L2-L3, L3-L4,L4-L5   LUMBAR SPINE SURGERY  06/2020   Bilateral redo laminectomy/laminotomy/foraminotomies/medial facetectomy to decompress the bilateral L4 and L5 nerve roots (JArnoldo [name]  TOTAL HIP ARTHROPLASTY Bilateral [id number] TOTAL KNEE ARTHROPLASTY Right 03/06/2017   Procedure: RIGHT TOTAL KNEE ARTHROPLASTY;  Surgeon: AlGaynelle ArabianMD;  [facility];  Service: Orthopedics;  Laterality: Right;   TRANSURETHRAL RESECTION OF BLADDER TUMOR [name] 10/19/2021   Procedure: TRANSURETHRAL RESECTION OF BLADDER TUMOR (TURBT);  Surgeon: StAbbie SonsMD;  Location: [facility];  Service: Urology;  Laterality: [name];    Home Medications:  Allergies as of 06/10/2022       Reactions   Vioxx [rofecoxib] Other (See Comments)   Hemorrhage    Spiriva Respimat [tiotropium Bromide Monohydrate] Other (See Comments)    Elevated bp   Contrast Media [iodinated Contrast Media] Itching, Rash   Delayed reaction post abdominal aortagram.    Morphine Nausea Only, Other (See Comments)   Irritability        Medication List        Accurate as of June 10, 2022  9:31 AM. If you have any questions, ask your nurse or doctor.          STOP taking these medications    Compressor/Nebulizer Misc Stopped by: SaDebroah LoopPA-C  cyclobenzaprine 5 MG tablet Commonly known as: FLEXERIL Stopped by: Debroah Loop, PA-C   OVER THE COUNTER MEDICATION Stopped by: Debroah Loop, PA-C       TAKE these medications    acetaminophen 500 MG tablet Commonly known as: TYLENOL Take 1,000 mg by mouth every 8 (eight) hours as needed for moderate pain.   aspirin EC 81 MG tablet Take 1 tablet (81 mg total) by mouth daily.   atorvastatin 20 MG tablet Commonly known as: LIPITOR TAKE 1 TABLET BY MOUTH DAILY.   Azelastine HCl 0.15 % Soln Commonly known as: Astepro Place 1 spray into the nose at bedtime as needed (Sinus congestion, allergies).   docusate sodium 100 MG capsule Commonly known as: COLACE Take 100 mg by mouth 2 (two) times daily as needed for mild constipation.   doxycycline 100 MG tablet Commonly known as: VIBRA-TABS Take 1 tablet (100 mg total) by mouth 2 (two) times daily.   ezetimibe 10 MG tablet Commonly known as: ZETIA TAKE 1 TABLET BY MOUTH ONCE DAILY.   famotidine 20 MG tablet Commonly known as: PEPCID Take 20 mg by mouth daily.   furosemide 20 MG tablet Commonly known as: LASIX Take 1 tablet by mouth every third day as directed.   gabapentin 600 MG tablet Commonly known as: NEURONTIN Take 600 mg by mouth 3 (three) times daily.   Gemtesa 75 MG Tabs Generic drug: Vibegron Take 75 mg by mouth daily.   ipratropium-albuterol 0.5-2.5 (3) MG/3ML Soln Commonly known as: DUONEB Take 3 mLs by nebulization every 6 (six) hours as needed.   isosorbide  mononitrate 30 MG 24 hr tablet Commonly known as: IMDUR Take 0.5 tablets (15 mg total) by mouth daily. What changed: when to take this   losartan 25 MG tablet Commonly known as: COZAAR TAKE 1 TABLET BY MOUTH DAILY   nitroGLYCERIN 0.4 MG SL tablet Commonly known as: NITROSTAT Place 1 tablet (0.4 mg total) under the tongue every 5 (five) minutes as needed for chest pain.   polyethylene glycol 17 g packet Commonly known as: MIRALAX / GLYCOLAX Take 17 g by mouth daily as needed for mild constipation.   predniSONE 10 MG tablet Commonly known as: DELTASONE Take 4 pills once daily by mouth for 3 days, then 3 pills daily for 3 days, then 2 pills daily for 3 days then 1 pill daily for 3 days then stop   ProAir HFA 108 (90 Base) MCG/ACT inhaler Generic drug: albuterol TAKE 2 PUFFS INTO LUNGS EVERY 6 HOURS ASNEEDED FOR WHEEZING OR SHORTNESS OF BREATH   ranolazine 500 MG 12 hr tablet Commonly known as: RANEXA TAKE ONE (1) TABLET BY MOUTH TWO TIMES PER DAY   tamsulosin 0.4 MG Caps capsule Commonly known as: FLOMAX TAKE 1 CAPSULE BY MOUTH AT BEDTIME        Allergies:  Allergies  Allergen Reactions   Vioxx [Rofecoxib] Other (See Comments)    Hemorrhage    Spiriva Respimat [Tiotropium Bromide Monohydrate] Other (See Comments)    Elevated bp   Contrast Media [Iodinated Contrast Media] Itching and Rash    Delayed reaction post abdominal aortagram.    Morphine Nausea Only and Other (See Comments)    Irritability     Family History: Family History  Problem Relation Age of Onset   Stroke Mother    Heart attack Mother    Diabetes Mother    Stroke Father    Heart disease Father    Polycystic kidney disease Father  Cancer Sister        throat   Polycystic kidney disease Sister    Diabetes Brother        1/2 brother   Cancer Other        5/7 nephews with lung cancer    Social History:   reports that he quit smoking about 33 years ago. His smoking use included  cigarettes. He has a 25.00 pack-year smoking history. He has been exposed to tobacco smoke. He has never used smokeless tobacco. He reports that he does not drink alcohol and does not use drugs.  Physical Exam: BP (!) 157/76   Pulse 82   Ht 6' (1.829 m)   Wt 219 lb (99.3 kg)   BMI 29.70 kg/m   Constitutional:  Alert and oriented, no acute distress, nontoxic appearing HEENT: [place], AT Cardiovascular: No clubbing, cyanosis, or edema Respiratory: Normal respiratory effort, no increased work of breathing Skin: No rashes, bruises or suspicious lesions Neurologic: Grossly intact, no focal deficits, moving all 4 extremities Psychiatric: Normal mood and affect  Laboratory Data: Results for orders placed or performed in visit on 06/10/22  BLADDER SCAN AMB NON-IMAGING  Result Value Ref Range   Scan Result 257m    Assessment & Plan:   1. Urge incontinence He notes equivalent therapeutic improvement on tolterodine versus Gemtesa, however GLogan Boresis cost prohibitive and his residual is slightly elevated over prior today.  Based on this, we will plan to discontinue Gemtesa.  Based on his age, I would prefer to avoid anticholinergics that cross the blood-brain barrier.  I recommend switching to trospium to reduce his risk for cognitive decline.  He is in agreement with this plan. - BLADDER SCAN AMB NON-IMAGING - Trospium Chloride 60 MG CP24; Take 1 capsule (60 mg total) by mouth daily.  Dispense: 30 capsule; Refill: 2   Return in about 2 months (around 08/10/2022) for Previously-scheduled cysto with Dr. SBernardo Heaterwith PVR prior on trospium.  SDebroah Loop PA-C  [facility] [address] [place] [phone]

## 2022-06-13 NOTE — Pre-Procedure Instructions (Signed)
Surgical Instructions    Your procedure is scheduled on Thursday, November 9.  Report to [facility] Main Entrance "A" at 7:30 A.M., then check in with the Admitting office.  Call this number if you have problems the morning of surgery:  [phone]   If you have any questions prior to your surgery date call [phone]: Open Monday-Friday 8am-4pm If you experience any cold or flu symptoms such as cough, fever, chills, shortness of breath, etc. between now and your scheduled surgery, please notify us at the above number     Remember:  Do not eat or drink after midnight the night before your surgery     Take these medicines the morning of surgery with A SIP OF WATER:  atorvastatin (LIPITOR)  doxycycline (VIBRA-TABS) ezetimibe (ZETIA) famotidine (PEPCID) gabapentin (NEURONTIN) predniSONE (DELTASONE) ranolazine (RANEXA) trospium (SANCTURA)  acetaminophen (TYLENOL) if needed ipratropium-albuterol (DUONEB) 0.5-2.5 (3) MG/3ML SOLN if needed PROAIR HFA 108 (90 Base) MCG/ACT inhaler if needed- Please bring all inhalers with you the day of surgery.   Follow your surgeon's instructions on when to stop Aspirin.  If no instructions were given by your surgeon then you will need to call the office to get those instructions.     7 days prior to surgery, STOP taking any Aleve, Naproxen, Ibuprofen, Motrin, Advil, Goody's, BC's, all herbal medications, fish oil, and all vitamins.    [facility] is not responsible for any belongings or valuables.    Do NOT Smoke (Tobacco/Vaping)  24 hours prior to your procedure  If you use a CPAP at night, you may bring your mask for your overnight stay.   Contacts, glasses, hearing aids, dentures or partials may not be worn into surgery, please bring cases for these belongings   For patients admitted to the hospital, discharge time will be determined by your treatment team.   Patients discharged the day of surgery will not be allowed to drive home, and  someone needs to stay with them for 24 hours.   SURGICAL WAITING ROOM VISITATION Patients having surgery or a procedure may have no more than 2 support people in the waiting area - these visitors may rotate.   Children under the age of 42 must have an adult with them who is not the patient. If the patient needs to stay at the hospital during part of their recovery, the visitor guidelines for inpatient rooms apply. Pre-op nurse will coordinate an appropriate time for 1 support person to accompany patient in pre-op.  This support person may not rotate.   Please refer to [email or web address] for the visitor guidelines for Inpatients (after your surgery is over and you are in a regular room).    Special instructions:    Oral Hygiene is also important to reduce your risk of infection.  Remember - BRUSH YOUR TEETH THE MORNING OF SURGERY WITH YOUR REGULAR TOOTHPASTE   - Preparing For Surgery  Before surgery, you can play an important role. Because skin is not sterile, your skin needs to be as free of germs as possible. You can reduce the number of germs on your skin by washing with CHG (chlorahexidine gluconate) Soap before surgery.  CHG is an antiseptic cleaner which kills germs and bonds with the skin to continue killing germs even after washing.     Please do not use if you have an allergy to CHG or antibacterial soaps. If your skin becomes reddened/irritated stop using the CHG.  Do not shave (including legs and underarms) for  at least 48 hours prior to first CHG shower. It is OK to shave your face.  Please follow these instructions carefully.     Shower the NIGHT BEFORE SURGERY and the MORNING OF SURGERY with CHG Soap.   If you chose to wash your hair, wash your hair first as usual with your normal shampoo. After you shampoo, rinse your hair and body thoroughly to remove the shampoo.  Then [employer] and genitals (private parts)  with your normal soap and rinse thoroughly to remove soap.  After that Use CHG Soap as you would any other liquid soap. You can apply CHG directly to the skin and wash gently with a scrungie or a clean washcloth.   Apply the CHG Soap to your body ONLY FROM THE NECK DOWN.  Do not use on open wounds or open sores. Avoid contact with your eyes, ears, mouth and genitals (private parts). Wash Face and genitals (private parts)  with your normal soap.   Wash thoroughly, paying special attention to the area where your surgery will be performed.  Thoroughly rinse your body with warm water from the neck down.  DO NOT shower/wash with your normal soap after using and rinsing off the CHG Soap.  Pat yourself dry with a CLEAN TOWEL.  Wear CLEAN PAJAMAS to bed the night before surgery  Place CLEAN SHEETS on your bed the night before your surgery  DO NOT SLEEP WITH PETS.   Day of Surgery:  Take a shower with CHG soap. Wear Clean/Comfortable clothing the morning of surgery Do not wear jewelry or makeup. Do not wear lotions, powders, perfumes/cologne or deodorant. Do not shave 48 hours prior to surgery.  Men may shave face and neck. Do not bring valuables to the hospital. Do not wear nail polish, gel polish, artificial nails, or any other type of covering on natural nails (fingers and toes) If you have artificial nails or gel coating that need to be removed by a nail salon, please have this removed prior to surgery. Artificial nails or gel coating may interfere with anesthesia's ability to adequately monitor your vital signs. Remember to brush your teeth WITH YOUR REGULAR TOOTHPASTE.    If you received a COVID test during your pre-op visit, it is requested that you wear a mask when out in public, stay away from anyone that may not be feeling well, and notify your surgeon if you develop symptoms. If you have been in contact with anyone that has tested positive in the last 10 days, please notify your  surgeon.    Please read over the following fact sheets that you were given.

## 2022-06-14 ENCOUNTER — Encounter (HOSPITAL_COMMUNITY): Payer: Self-pay | Admitting: Vascular Surgery

## 2022-06-14 ENCOUNTER — Encounter (HOSPITAL_COMMUNITY): Payer: Self-pay

## 2022-06-14 ENCOUNTER — Encounter (HOSPITAL_COMMUNITY)
Admission: RE | Admit: 2022-06-14 | Discharge: 2022-06-14 | Disposition: A | Discharge status: Home / Self Care | Payer: PPO | Source: Ambulatory Visit | Attending: Neurosurgery | Admitting: Neurosurgery

## 2022-06-14 ENCOUNTER — Other Ambulatory Visit: Payer: Self-pay

## 2022-06-14 VITALS — BP 143/62 | HR 78 | Temp 97.9°F | Resp 17 | Ht 72.0 in | Wt 214.0 lb

## 2022-06-14 DIAGNOSIS — I739 Peripheral vascular disease, unspecified: Secondary | ICD-10-CM | POA: Insufficient documentation

## 2022-06-14 DIAGNOSIS — I252 Old myocardial infarction: Secondary | ICD-10-CM | POA: Diagnosis not present

## 2022-06-14 DIAGNOSIS — N400 Enlarged prostate without lower urinary tract symptoms: Secondary | ICD-10-CM | POA: Insufficient documentation

## 2022-06-14 DIAGNOSIS — I10 Essential (primary) hypertension: Secondary | ICD-10-CM | POA: Insufficient documentation

## 2022-06-14 DIAGNOSIS — Z01818 Encounter for other preprocedural examination: Secondary | ICD-10-CM

## 2022-06-14 DIAGNOSIS — G4733 Obstructive sleep apnea (adult) (pediatric): Secondary | ICD-10-CM | POA: Insufficient documentation

## 2022-06-14 DIAGNOSIS — Z87891 Personal history of nicotine dependence: Secondary | ICD-10-CM | POA: Insufficient documentation

## 2022-06-14 DIAGNOSIS — R7303 Prediabetes: Secondary | ICD-10-CM | POA: Insufficient documentation

## 2022-06-14 DIAGNOSIS — N411 Chronic prostatitis: Secondary | ICD-10-CM | POA: Insufficient documentation

## 2022-06-14 DIAGNOSIS — M1990 Unspecified osteoarthritis, unspecified site: Secondary | ICD-10-CM | POA: Insufficient documentation

## 2022-06-14 DIAGNOSIS — K449 Diaphragmatic hernia without obstruction or gangrene: Secondary | ICD-10-CM | POA: Insufficient documentation

## 2022-06-14 DIAGNOSIS — Z951 Presence of aortocoronary bypass graft: Secondary | ICD-10-CM | POA: Diagnosis not present

## 2022-06-14 DIAGNOSIS — I25119 Atherosclerotic heart disease of native coronary artery with unspecified angina pectoris: Secondary | ICD-10-CM | POA: Diagnosis not present

## 2022-06-14 DIAGNOSIS — Z01812 Encounter for preprocedural laboratory examination: Secondary | ICD-10-CM | POA: Insufficient documentation

## 2022-06-14 HISTORY — DX: Cardiac murmur, unspecified: R01.1

## 2022-06-14 LAB — CBC
HCT: 38.7 % — ABNORMAL LOW (ref 39.0–52.0)
Hemoglobin: 12.9 g/dL — ABNORMAL LOW (ref 13.0–17.0)
MCH: 32.9 pg (ref 26.0–34.0)
MCHC: 33.3 g/dL (ref 30.0–36.0)
MCV: 98.7 fL (ref 80.0–100.0)
Platelets: 199 10*3/uL (ref 150–400)
RBC: 3.92 MIL/uL — ABNORMAL LOW (ref 4.22–5.81)
RDW: 12.6 % (ref 11.5–15.5)
WBC: 8.7 10*3/uL (ref 4.0–10.5)
nRBC: 0 % (ref 0.0–0.2)

## 2022-06-14 LAB — BASIC METABOLIC PANEL
Anion gap: 10 (ref 5–15)
BUN: 34 mg/dL — ABNORMAL HIGH (ref 8–23)
CO2: 26 mmol/L (ref 22–32)
Calcium: 9.1 mg/dL (ref 8.9–10.3)
Chloride: 104 mmol/L (ref 98–111)
Creatinine, Ser: 1.48 mg/dL — ABNORMAL HIGH (ref 0.61–1.24)
GFR, Estimated: 47 mL/min — ABNORMAL LOW (ref >60)
Glucose, Bld: 121 mg/dL — ABNORMAL HIGH (ref 70–99)
Potassium: 3.9 mmol/L (ref 3.5–5.1)
Sodium: 140 mmol/L (ref 135–145)

## 2022-06-14 LAB — TYPE AND SCREEN
ABO/RH(D): A NEG
Antibody Screen: NEGATIVE

## 2022-06-14 LAB — SURGICAL PCR SCREEN
MRSA, PCR: NEGATIVE
Staphylococcus aureus: NEGATIVE

## 2022-06-14 NOTE — Progress Notes (Addendum)
PCP - Ria Bush MD Cardiologist - Gollan  PPM/ICD - denies  Chest x-ray - 06/06/22 EKG - 05/19/22 Stress Test - 04/22/21 ECHO - 03/23/21 Cardiac Cath - 07/07/15  Sleep Study - yes- OSA + CPAP - yes  No diabetes.  7 days prior to surgery, STOP taking any Aleve, Naproxen, Ibuprofen, Motrin, Advil, Goody's, BC's, all herbal medications, fish oil, and all vitamins.  PT reports he was told to stop aspirin 5 days before surgery. He stopped today.  ERAS Protcol - NO PRE-SURGERY Ensure or G2- n/a  COVID TEST- n/a   Anesthesia review: yes - Patient has been struggling with bronchitis since September. He has an appointment with his pulmonologist tomorrow (11/1). Ebony Hail is aware of situation and following closely.  Patient denies shortness of breath, fever, cough and chest pain at PAT appointment   All instructions explained to the patient, with a verbal understanding of the material. Patient agrees to go over the instructions while at home for a better understanding. The opportunity to ask questions was provided.

## 2022-06-14 NOTE — Progress Notes (Signed)
Anesthesia Chart Review:  Case: [id number] Date/Time: 06/23/22 0943   Procedure: ACDF,IP,PLATE/SCREWS, C45, 39 - 3C   Anesthesia type: General   Pre-op diagnosis: CERVICAL SPONDYLOSIS WITH RADICULOPATHY   Location: [facility] ROOM 19 / [facility]   Surgeons: [name], MD       DISCUSSION: Patient is a 81 year old male scheduled for the above procedure.   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   Last cardiology visit with Dr. [name] on 05/19/22 for follow-up CAD and PAD. Preoperative input provided, "Preop cardiovascular evaluation for cervical spine surgery: Currently with no anginal symptoms.  EKG is unchanged Lexiscan Myoview last year was low risk.  Thus, the patient can proceed with surgery at an overall low risk from a cardiac standpoint with no need for ischemic cardiac evaluation before surgery." He reported instructions to hold ASA for 5 days prior to surgery.   He has been seen several times since 04/26/22 for respiratory symptoms. Initially supportive therapy 04/26/22 for suspected viral illness, then prescribed doxycycline x 10 days on 04/28/22. Seen again on 05/27/22 and prescribed Levaquin x 5 days for concern for bronchitis versus COPD exacerbation. Improving but persistent symptoms and prescribed prednisone taper and another course of doxycycline by Dr. Glori Bickers on 06/06/22. No focal consolidation on CXR. He has pulmonology follow-up with Dr. Patsey Berthold on  06/15/22. I've asked that he let her know about surgery plans and will await her input.  ADDENDUM 06/15/22 3:57 PM:  Patient had evaluation by his pulmonologist Dr. Patsey Berthold this morning. She notes multiple prescriptions since 04/2022 for respiratory symptoms. Steroids helpful and finally feeling recovered and not coughing or needing albuterol MDI for the last two days. He had previously stopped Trelegy due to cost. 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 FeNO elevated at 55 even on prednisone and had prior eosinophilia on CBC with differential.  She restarted his Trelegy and recommended future allergen panel once he was off steroids. Continued compliance with CPAP. She did not mention upcoming surgery in her note, but request for preoperative pulmonology risk assessment sent per Dr. [name]. She responded: Moderate to High Risk. "Currently getting over exacerbation.  New Dx of asthma component COPD/asthma - mod-severe OSA on CPAP. Risk cannot be further reduced. Clearance form received after pt seen."  I have updated anesthesiologist Rochele Pages, DO on pulmonologist input.  Anesthesia team to evaluate on the day of surgery.    VS: BP (!) 143/62   Pulse 78   Temp 36.6 C (Oral)   Resp 17   Ht 6' (1.829 m)   Wt 97.1 kg   SpO2 96%   BMI 29.02 kg/m    PROVIDERS: Ria Bush, MD is PCP Ida Rogue, MD is cardiologist Kathlyn Sacramento, MD is PV cardiologist Vernard Gambles, MD is pulmonologist   LABS: Labs reviewed: Acceptable for surgery. Creatinine 1.48, previously 1.42 on 02/21/22. AST 13 and ALT 10 on 02/21/22. A1c 6.5% on 02/21/22. (all labs ordered are listed, but only abnormal results are displayed)  Labs Reviewed  BASIC METABOLIC PANEL - Abnormal; Notable for  the following components:      Result Value   Glucose, Bld 121 (*)    BUN 34 (*)    Creatinine, Ser 1.48 (*)    GFR, Estimated 47 (*)    All other components within normal  limits  CBC - Abnormal; Notable for the following components:   RBC 3.92 (*)    Hemoglobin 12.9 (*)    HCT 38.7 (*)    All other components within normal limits  SURGICAL PCR SCREEN  TYPE AND SCREEN    OTHER:  Overnight Pulse Oximetry 05/07/22: Transient O2 desaturations on CPAP and not significant enough to qualify for nocturnal O2 with CPAP.   NPSG 08/05/21: Severe OSA. Total AHI was 59.7/hr.  Periodic Limb Movement Disorder Discussion: Split-night PSG ordered but the tech noted that the patient did not qualify so a baseline PSG was performed.  No REM sleep recorded.  Significant obstructive events seen with low SaO2 77%.  PL MS observed.  Frequent PACs noted and PVCs seen.   Recommendations: Recommend auto CPAP therapy with a pressure range of 5 to 5 cm H2O.  Alternately referred back to the sleep lab for nasal CPAP titration study.   IMAGES: CXR 06/06/22: FINDINGS: Transverse diameter of heart is slightly increased. Metallic sutures are seen in the sternum. There are no signs of pulmonary edema or focal pulmonary consolidation. There is no pleural effusion or pneumothorax. IMPRESSION: There are no new infiltrates or signs of pulmonary edema.   MRI Abd/Pelvis 04/13/22: IMPRESSION: - Motion degraded examination limiting sensitivity and specificity within this context: 1. No solid enhancing renal mass. 2. No suspicious collecting system or ureteral filling defect identified, although with limited evaluation of the right ureter secondary to poor distension. 3. Evaluation of the urinary bladder is degraded by susceptibility artifact from bilateral hip prostheses. 4. No evidence of metastatic disease in the abdomen or pelvis. 5. Colonic diverticulosis without findings of acute diverticulitis. 6. Left ischial bursitis.   MRI C-spine 03/23/22: IMPRESSION: 1. Generalized cervical spine degeneration as described. Notable active facet arthritis on the right at C4-5. 2. C3-4 ACDF  with solid arthrodesis. 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 3. Foraminal impingement on the right at C4-5, bilaterally at C5-6, and on the left at C6-7.   MRI L-spine 01/02/22: IMPRESSION: 1. Unchanged moderate right and severe left L5-S1 neural foraminal stenosis. 2. Unchanged mild bilateral L3-4 neural foraminal stenosis. 3. L4-5 PLIF with posterior decompression. Left lateral recess narrowing is unchanged but spinal canal patency is greatly improved.   CT Abd/Pelvis 01/01/22: IMPRESSION: 1. No acute intra-abdominal or pelvic pathology. 2. Sigmoid diverticulosis. No bowel obstruction. 3. 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 4. Aortic Atherosclerosis (ICD10-I70.0).    EKG: 05/19/22: NSR. LAD. Cannot rule out anterior infarct, age undetermined.    CV: Echo 03/17/21: IMPRESSIONS   1. Left ventricular ejection fraction, by estimation, is 50 to 55%. The  left ventricle has low normal function. The left ventricle has no regional  wall motion abnormalities. There is mild left ventricular hypertrophy.  Left ventricular diastolic  parameters are consistent with Grade I diastolic dysfunction (impaired  relaxation).   2. Right ventricular systolic function is normal. The right ventricular  size is normal. There is mildly elevated pulmonary artery systolic  pressure. The estimated right ventricular systolic pressure is 19.6 mmHg.   3. Left atrial size was mild to moderately dilated.   4. The mitral valve is normal in structure. Trivial mitral  valve  regurgitation. No evidence of mitral stenosis.   5. The aortic valve is normal in structure. Aortic valve regurgitation is  not visualized. Mild to moderate aortic valve sclerosis/calcification is  present, without any evidence of aortic stenosis.    Nuclear stress test 06/05/20: Low risk, probably normal pharmacologic myocardial perfusion stress test. There is a small in size, moderate in  severity, fixed apical anterior and apical defect that most likely represents artifact and less likely scar. This is unchanged from the prior study in 03/2019. There is a small in size, mild in severity, mid inferolateral defect most consistent with artifact but cannot rule out subtle ischemia. The left ventricular ejection fraction appears normal by visual estimation but is mildly decreased by QGS (48%) and Simens (51%) calculations. Attenuation correction CT shows post-CABG findings and aortic atherosclerosis. Bilateral airspace opacities in the visualized lungs are similar to the CTA chest from 12/18/2019, allowing for differences in technique.    Abdominal Aortogram with LE runoff 09/12/18 [name], Rogue Jury, MD): 1.  No significant aortoiliac disease. 2.  Right lower extremity: Moderately to severely calcified SFA with diffuse disease throughout its course especially in the midsegment and three-vessel runoff below the knee. 3.  Left lower extremity: Borderline significant SFA disease which is moderately to severely calcified and three-vessel runoff below the knee. Recommendations: The patient's peripheral arterial disease does not explain his degree of claudication and rest pain which I suspect is likely due to other etiology such as spinal stenosis.  He complains of worse symptoms on the left side than the right side although his disease is worse on the right side.  Both SFAs can be treated with atherectomy if needed in the future.     Carotid US 09/09/16: Impressions: Heterogeneous plaque, bilaterally. 1 to 39% bilateral ICA stenosis. Patent vertebral arteries with antegrade flow. Normal subclavian arteries, bilaterally.    Cardiac cath 07/07/15: Conclusion: Ost LM to LM lesion, 50% stenosed. Ost RCA lesion, 80% stenosed. Prox RCA-1 lesion, 80% stenosed. Prox RCA-2 lesion, 80% stenosed. LIMA was injected is moderate in size, and is anatomically normal. Dist LAD lesion, 60%  stenosed. Ost LAD to Prox LAD lesion, 100% stenosed. Mid LAD lesion, 60% stenosed. The left ventricular systolic function is normal. SVG was injected is normal in caliber, and is anatomically normal. 2nd Diag lesion, 50% stenosed. LVEF is estimated at 55-65%. Final Conclusions:    Angina likely from diffuse disease, severe RCA disease (small vessel) not amenable to PCI, moderate left main disease, moderate LAD and proximal diagonal disease.  --Patent grafts x2  --No intervention indicated, medical management.  Recommendations:  Etiology of his chest pain could be secondary to small vessel disease.  Unable to exclude GI etiology.  Could try ranexa for symptoms.     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 06/2019 Echo (in setting of COVID): EF 35-40%, mild LVH, g1 DD, glob HK; b. 09/2019 Echo: EF 50-55%, Gr1 DD; c. 03/2021 Echo: EF 50-55%, no rwma, mild LVH, GrI DD, nl RV fxn. RVSP 44.15mHg. Mild-mod dil LA. Triv MR. Mild-mod Ao sclerosis.   AAA (abdominal aortic aneurysm) (HCC)    Actinic keratosis    Anginal pain ([place]    BENIGN PROSTATIC HYPERTROPHY, WITH URINARY OBSTRUCTION 09/06/2007   CAD s/p CABG    a. 1990 s/p MI-->CABG x 2; b. 2002 s/p BMS to LCX; c. 06/2015 Cath: LM 50ost, LAD 100ost/p, 677m, D2 nl, LCX  patent stent, RCA 80p (small), LIMA->LAD nl, VG->D2 nl-->Med Rx; d. 05/2020 MV: fixed apical ant/apical defect. No signif ischemia; e. 04/2021 MV: EF 52%, no ischemia.   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 08/2016 Carotid U/S: <39% bilat.   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 did not tolerate CPAP.   Osteoarthritis    PAD (peripheral artery disease) ([place])    a. 07/2017 LE duplex: RSFA 75-50m LSFA [id number]; c. 08/2018 Periph Angio: No signif AoIliac dzs. Mod-sev Ca2+ RSFA w/ diff dzs throughout- 3 vessel runoff. Borderline signif LSFA dzs w/ mod-sev Ca2+ vessels and 3 vessel runoff below the knee-->Med rx.   Pneumonia 07/2014   [facility] hospitalization   Pneumonia due to COVID-19 virus 06/02/2019   Pre-diabetes    Prostatitis, chronic    PSA, INCREASED 07/09/2008   Spinal stenosis of lumbar region     Past Surgical History:  Procedure Laterality Date   ABDOMINAL AORTOGRAM W/LOWER EXTREMITY [name] 09/12/2018   Procedure: ABDOMINAL AORTOGRAM W/LOWER EXTREMITY;  Surgeon: ArWellington HampshireMD;  [facility];  Service: Cardiovascular;  Laterality: [name];   APPENDECTOMY     rupture   BACK SURGERY     1984 and then another one [facility]   BLADDER INSTILLATION [name] 10/19/2021   Procedure: BLADDER INSTILLATION OF GEMCITABINE;  Surgeon: StAbbie SonsMD;  Location: [facility]S;  Service: Urology;  Laterality: [name];   CARDIAC CATHETERIZATION [name] 07/07/2015   Procedure: Left Heart Cath and Cors/Grafts Angiography;  Surgeon: TiMinna MerrittsMD;  [facility];  Service: Cardiovascular;  Laterality: [name];   CATARACT EXTRACTION, BILATERAL  2009   CERVICAL SPINE SURGERY  12/2016   cervical stenosis (JArnoldo [name]  COLONOSCOPY  03/2010   HP polyp, diverticulosis, rpt 10 yrs ([name])   CORONARY ANGIOPLASTY  11/30/2000   LCX    CORONARY ARTERY BYPASS GRAFT  1990   3 vessel    CYSTOSCOPY  12/23/2010   [facility]   EYE SURGERY     cataract   JOINT REPLACEMENT     KNEE ARTHROSCOPY Right    x2   LAMINOTOMY  1986   L5/S1 lumbar laminotomy for two ruptured discs/fusion   LUMBAR LAMINECTOMY/DECOMPRESSION MICRODISCECTOMY [name] 07/13/2016   Procedure: LUMBAR TWO-THREE, LUMBAR THREE-FOUR, LUMBAR FOUR-FIVE LAMINECTOMY AND FORAMINOTOMY;  Surgeon: JeNewman PiesMD;  [facility] Service: Neurosurgery;  Laterality: [name];  LAMINECTOMY AND FORAMINOTOMY L2-L3, L3-L4,L4-L5   LUMBAR SPINE SURGERY  06/2020   Bilateral redo laminectomy/laminotomy/foraminotomies/medial facetectomy to decompress the bilateral L4 and L5 nerve roots (JArnoldo [name]  TOTAL HIP  ARTHROPLASTY Bilateral [id number]   TOTAL KNEE ARTHROPLASTY Right 03/06/2017   Procedure: RIGHT TOTAL KNEE ARTHROPLASTY;  Surgeon: Gaynelle Arabian, MD;  Location: [facility];  Service: Orthopedics;  Laterality: Right;   TRANSURETHRAL RESECTION OF BLADDER TUMOR [name] 10/19/2021   Procedure: TRANSURETHRAL RESECTION OF BLADDER TUMOR (TURBT);  Surgeon: Abbie Sons, MD;  Location: [facility]S;  Service: Urology;  Laterality: [name];    MEDICATIONS:  acetaminophen (TYLENOL) 500 MG tablet   aspirin EC 81 MG EC tablet   atorvastatin (LIPITOR) 20 MG tablet   Azelastine HCl (ASTEPRO) 0.15 % SOLN   docusate sodium (COLACE) 100 MG capsule   doxycycline (VIBRA-TABS) 100 MG tablet   ezetimibe (ZETIA) 10 MG tablet   famotidine (PEPCID) 20 MG tablet   furosemide (LASIX) 20 MG tablet   gabapentin (NEURONTIN) 600 MG tablet   ipratropium-albuterol (DUONEB) 0.5-2.5 (3) MG/3ML SOLN   isosorbide mononitrate (IMDUR) 30 MG 24 hr tablet   losartan (COZAAR) 25 MG tablet   nitroGLYCERIN (NITROSTAT) 0.4 MG SL tablet   polyethylene glycol (MIRALAX / GLYCOLAX) 17 g packet   predniSONE (DELTASONE) 10 MG tablet   PROAIR HFA 108 (90 Base) MCG/ACT inhaler   ranolazine (RANEXA) 500 MG 12 hr tablet   tamsulosin (FLOMAX)  0.4 MG CAPS capsule   trospium (SANCTURA) 20 MG tablet   No current facility-administered medications for this encounter.    Myra Gianotti, PA-C Surgical Short Stay/Anesthesiology [facility] Phone [phone] [facility] Phone [phone] 06/14/2022 6:49 PM

## 2022-06-15 ENCOUNTER — Ambulatory Visit: Payer: PPO | Admitting: Family Medicine

## 2022-06-15 ENCOUNTER — Encounter: Payer: Self-pay | Admitting: Pulmonary Disease

## 2022-06-15 ENCOUNTER — Ambulatory Visit (INDEPENDENT_AMBULATORY_CARE_PROVIDER_SITE_OTHER): Payer: PPO | Admitting: Pulmonary Disease

## 2022-06-15 VITALS — BP 122/58 | HR 63 | Temp 97.7°F | Ht 72.0 in | Wt 213.6 lb

## 2022-06-15 DIAGNOSIS — R0602 Shortness of breath: Secondary | ICD-10-CM | POA: Diagnosis not present

## 2022-06-15 DIAGNOSIS — J4489 Other specified chronic obstructive pulmonary disease: Secondary | ICD-10-CM

## 2022-06-15 DIAGNOSIS — J45901 Unspecified asthma with (acute) exacerbation: Secondary | ICD-10-CM | POA: Diagnosis not present

## 2022-06-15 DIAGNOSIS — G4733 Obstructive sleep apnea (adult) (pediatric): Secondary | ICD-10-CM

## 2022-06-15 LAB — NITRIC OXIDE: Nitric Oxide: 55

## 2022-06-15 MED ORDER — TRELEGY ELLIPTA 200-62.5-25 MCG/ACT IN AEPB: 1.0000 | INHALATION_SPRAY | Freq: Every day | RESPIRATORY_TRACT | 0 refills | Status: DC

## 2022-06-15 NOTE — Progress Notes (Signed)
Subjective:    Patient ID: [name], male    DOB: Jul 04, 1941, 81 y.o.   MRN: [id number] Patient Care Team: Ria Bush, MD as PCP - General (Family Medicine) Debbora Dus, [facility] as Pharmacist (Pharmacist) [name], MD as Consulting Physician (Cardiology) Tyler Pita, MD as Consulting Physician (Pulmonary Disease) Chesley Mires, MD as Consulting Physician (Pulmonary Disease) - Sleep  Chief Complaint  Patient presents with   Follow-up    No SOB or wheezing. Dry cough.     HPI Patient is an 81 year old former smoker (PY25) who presents for follow-up the issue of mild to moderate COPD and dyspnea.  Patient was last evaluated on 28 April 2022 at that time he was battling a "bronchitis".  He was treated with doxycycline which improved his symptoms transiently.  He then was seen at urgent care on 13 October for cough and congestion with viral URI.  Was given a prescription of Levaquin at that time.  Subsequently saw primary care on 23 October and required a prednisone taper which he is completing now.  He had a chest x-ray performed that time that showed no infiltrates.  He states that he feels like the last 2 days he has felt better.  He feels the prednisone is helping but he is about to finish the course.  No fevers, chills or sweats.  No cough for the last 2 days.  No need for rescue inhaler over the last 2 days.  No chest pain, no lower extremity edema or calf tenderness.  In the past was tried on Trelegy however, found it too costly and was switched to [employer] which he uses only as needed.  Review of Systems A 10 point review of systems was performed and it is as noted above otherwise negative.  Patient Active Problem List   Diagnosis Date Noted   Acute bronchitis with COPD ([place]) 06/06/2022   Right low back pain 12/29/2021   COPD exacerbation ([place]) 11/12/2021   Bladder cancer ([place]) 11/12/2021   Acute-on-chronic kidney injury ([place]) 07/09/2021   OSA  (obstructive sleep apnea)    Acute on chronic diastolic CHF (congestive heart failure) ([place])    Right leg pain 06/22/2021   Nocturnal hypoxia 06/11/2021   CKD (chronic kidney disease) stage 3, GFR 30-59 ml/min ([place]) 02/24/2021   Peripheral neuropathy 02/23/2021   Spondylolisthesis of lumbar region 07/07/2020   COPD (chronic obstructive pulmonary disease) ([place]) 01/14/2020   Coccydynia 09/20/2019   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   Anxiety 06/04/2019   Chronic right shoulder pain 09/10/2018   Cervical stenosis of spinal canal 06/05/2017   Pedal edema 06/05/2017   Health maintenance examination 12/06/2016   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/13/2016   Overweight (BMI 25.0-29.9) 07/04/2016   Low vitamin B12 level 01/01/2016   Exertional dyspnea 07/07/2015   Medicare annual wellness visit, subsequent 07/03/2015   Advanced care planning/counseling discussion 07/03/2015   Acute recurrent frontal sinusitis 05/05/2015   Type 2 diabetes mellitus with other specified complication ([place]) 09/62/8366   Pleuritic chest pain 05/04/2015   Ex-smoker 05/04/2015   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   Spermatocele 04/02/2013   Syncopal vertigo 11/04/2010   Lumbar disc disease with radiculopathy 11/04/2010   CAD, ARTERY BYPASS GRAFT 08/11/2009   PAD (peripheral artery disease) ([place]) 08/11/2009   DUPUYTREN'S CONTRACTURE, 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   Carotid stenosis 07/09/2008   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   Benign prostatic hyperplasia with urinary obstruction 09/06/2007   Essential hypertension 04/30/2007  GERD 04/30/2007   Hyperlipidemia 04/26/2007   OA (osteoarthritis) of knee 04/26/2007   DVT, HX OF 04/26/2007   Social History   Tobacco Use   Smoking status: Former    Packs/day: 1.00    Years: 25.00    Total pack years: 25.00    Types: Cigarettes    Quit date: 08/15/1988    Years since quitting: 33.8    Passive exposure: Past   Smokeless tobacco:  Never  Substance Use Topics   Alcohol use: No    Alcohol/week: 0.0 standard drinks of alcohol   Allergies  Allergen Reactions   Vioxx [Rofecoxib] Other (See Comments)    Hemorrhage    Spiriva Respimat [Tiotropium Bromide Monohydrate] Other (See Comments)    Elevated bp   Contrast Media [Iodinated Contrast Media] Itching and Rash    Delayed reaction post abdominal aortagram.    Morphine Nausea Only and Other (See Comments)    Irritability    Current Meds  Medication Sig   acetaminophen (TYLENOL) 500 MG tablet Take 1,000 mg by mouth every 8 (eight) hours as needed for moderate pain.   aspirin EC 81 MG EC tablet Take 1 tablet (81 mg total) by mouth daily.   atorvastatin (LIPITOR) 20 MG tablet TAKE 1 TABLET BY MOUTH DAILY.   Azelastine HCl (ASTEPRO) 0.15 % SOLN Place 1 spray into the nose at bedtime as needed (Sinus congestion, allergies).   docusate sodium (COLACE) 100 MG capsule Take 100 mg by mouth 2 (two) times daily as needed for mild constipation.   ezetimibe (ZETIA) 10 MG tablet TAKE 1 TABLET BY MOUTH ONCE DAILY.   famotidine (PEPCID) 20 MG tablet Take 20 mg by mouth daily.   furosemide (LASIX) 20 MG tablet Take 1 tablet by mouth every third day as directed.   gabapentin (NEURONTIN) 600 MG tablet Take 600 mg by mouth 3 (three) times daily.   ipratropium-albuterol (DUONEB) 0.5-2.5 (3) MG/3ML SOLN Take 3 mLs by nebulization every 6 (six) hours as needed.   isosorbide mononitrate (IMDUR) 30 MG 24 hr tablet Take 0.5 tablets (15 mg total) by mouth daily. (Patient taking differently: Take 15 mg by mouth at bedtime.)   losartan (COZAAR) 25 MG tablet TAKE 1 TABLET BY MOUTH DAILY   nitroGLYCERIN (NITROSTAT) 0.4 MG SL tablet Place 1 tablet (0.4 mg total) under the tongue every 5 (five) minutes as needed for chest pain.   polyethylene glycol (MIRALAX / GLYCOLAX) 17 g packet Take 17 g by mouth daily as needed for mild constipation.    predniSONE (DELTASONE) 10 MG tablet Take 4 pills once  daily by mouth for 3 days, then 3 pills daily for 3 days, then 2 pills daily for 3 days then 1 pill daily for 3 days then stop   PROAIR HFA 108 (90 Base) MCG/ACT inhaler TAKE 2 PUFFS INTO LUNGS EVERY 6 HOURS ASNEEDED FOR WHEEZING OR SHORTNESS OF BREATH   ranolazine (RANEXA) 500 MG 12 hr tablet TAKE ONE (1) TABLET BY MOUTH TWO TIMES PER DAY   tamsulosin (FLOMAX) 0.4 MG CAPS capsule TAKE 1 CAPSULE BY MOUTH AT BEDTIME   trospium (SANCTURA) 20 MG tablet Take 1 tablet (20 mg total) by mouth 2 (two) times daily.   Immunization History  Administered Date(s) Administered   Fluad Quad(high Dose 65+) 04/17/2019   Influenza Whole 08/15/2000, 07/04/2007, 05/09/2008, 05/31/2010   Influenza, High Dose Seasonal PF 05/15/2013, 06/25/2015, 07/05/2021   Influenza,inj,Quad PF,6+ Mos 04/17/2014, 05/18/2016, 06/07/2018   Influenza-Unspecified 05/22/2017, 05/28/2020   PFIZER(Purple  Top)SARS-COV-2 Vaccination 09/07/2019, 09/28/2019, 05/12/2020, 01/19/2021   Pneumococcal Conjugate-13 02/09/2015   Pneumococcal Polysaccharide-23 08/15/2000, 10/29/2008   Td 08/15/2005   Tdap 02/10/2018   Zoster Recombinat (Shingrix) 04/05/2017, 07/25/2017   Zoster, Live 02/04/2009       Objective:   Physical Exam BP (!) 122/58 (BP Location: Left Arm, Cuff Size: Normal)   Pulse 63   Temp 97.7 F (36.5 C)   Ht 6' (1.829 m)   Wt 213 lb 9.6 oz (96.9 kg)   SpO2 97%   BMI 28.97 kg/m  GENERAL: This is a well-developed, overweight gentleman, chronically ill-appearing, who is awake, alert, no acute distress.  Fully ambulatory. HEAD: Normocephalic, atraumatic.  EYES: Pupils equal, round, reactive to light.  No scleral icterus.  MOUTH: Nose/mouth/throat not examined due to masking requirements for COVID 19. NECK: Supple. No thyromegaly. Trachea midline. No JVD.  No adenopathy. PULMONARY: Good air entry bilaterally, coarse breath sounds otherwise, no adventitious sounds.  CARDIOVASCULAR: S1 and S2. Regular rate and rhythm.  Grade  1/6 systolic ejection murmur left sternal border. GASTROINTESTINAL: Protuberant abdomen, soft, no tenderness.   MUSCULOSKELETAL: No joint deformity, no clubbing, no edema.  NEUROLOGIC: No overt focal deficits noted.  Speech is fluent.   SKIN: Intact,warm,dry.  Limited exam shows no rashes.  He has multiple purpura in the upper extremities. PSYCH: Mood and behavior normal.  Overnight oximetry performed 07 May 2022 showed no need of oxygen supplementation with CPAP.  Compliance with CPAP is excellent usage 29 out of 30 days 97% usage over 4 hours 29 days.  AHI 4.3.  CBC on 21 February 2022 showed elevated eosinophils.  Chest x-ray performed 06 June 2022, independently reviewed:   Lab Results  Component Value Date   NITRICOXIDE 55 06/15/2022      Assessment & Plan:     ICD-10-CM   1. COPD with asthma  J44.89    FeNO elevated at 55 even on prednisone Type II inflammation suspected Prior eosinophilia noted on CBC with differential Restart Trelegy at 200 dose,daily    2. Persistent asthma with acute exacerbation, unspecified asthma severity  J45.901 Nitric oxide   Complete steroid taper Restart Trelegy as above Reassess with allergen panel etc. once off of steroids    3. OSA (obstructive sleep apnea)  G47.33    Patient well compensated He is compliant with CPAP therapy     Orders Placed This Encounter  Procedures   Nitric oxide   Meds ordered this encounter  Medications   Fluticasone-Umeclidin-Vilant (TRELEGY ELLIPTA) 200-62.5-25 MCG/ACT AEPB    Sig: Inhale 1 puff into the lungs daily.    Dispense:  28 each    Refill:  0    Order Specific Question:   Lot Number?    Answer:   [id number]    Order Specific Question:   Expiration Date?    Answer:   10/14/2023    Order Specific Question:   Quantity    Answer:   2   Fluticasone-Umeclidin-Vilant (TRELEGY ELLIPTA) 200-62.5-25 MCG/ACT AEPB    Sig: Inhale 1 puff into the lungs daily.    Dispense:  28 each    Refill:  0    Provided the patient with samples of Trelegy Ellipta 200.  He will use 1 puff daily and rinse well after use.  He also received a coupon for an extra months worth.  If effective, we will need to reassess affordability for the patient.  May need to fill [employer] patient assistance forms.  We will see  the patient in follow-up in 6 to 8 weeks time he is to contact us prior to that time should any new problems arise.  Renold Don, MD Advanced Bronchoscopy PCCM [facility]    *This note was dictated using voice recognition software/Dragon.  Despite best efforts to proofread, errors can occur which can change the meaning. Any transcriptional errors that result from this process are unintentional and may not be fully corrected at the time of dictation.

## 2022-06-15 NOTE — Telephone Encounter (Signed)
Patient in office. He stated that [facility] has not pickup oxygen yet.   Rodena Piety, can you f/u on this?

## 2022-06-15 NOTE — Patient Instructions (Signed)
We are giving you a trial of an inhaler called Trelegy Ellipta this is 1 puff daily.  Make sure you rinse your mouth well after you use it.  Fully this will limit the number of times that you have to use your nebulizer or your emergency inhaler.  While on the Trelegy, use your nebulizer only if needed.  You may use it up to 3 times a day if needed.  We have given you enough samples to last a month and have provided you with a coupon to get an extra months worth of the Trelegy.  Please go to your pharmacy and get that filled right away because the coupon expires in December.  I do recommend that you get the RSV shot however, you must be at least off of the prednisone for a week for you can take it safely.  You could go to [facility] or [facility] to get the shot.  We will see you in follow-up in 6 to 8 weeks time call sooner should any new problems arise.

## 2022-06-15 NOTE — Telephone Encounter (Signed)
I spoke with Melissa from [employer] while patient was in the office. Melissa stated the pick up order was sent to the [facility] location and wasn't sure why.  Melissa will check into this issue

## 2022-06-15 NOTE — Anesthesia Preprocedure Evaluation (Deleted)
Anesthesia Evaluation    Airway        Dental   Pulmonary former smoker,           Cardiovascular hypertension,      Neuro/Psych    GI/Hepatic   Endo/Other    Renal/GU      Musculoskeletal   Abdominal   Peds  Hematology   Anesthesia Other Findings   Reproductive/Obstetrics                             Anesthesia Physical Anesthesia Plan  ASA:   Anesthesia Plan:    Post-op Pain Management:    Induction:   PONV Risk Score and Plan:   Airway Management Planned:   Additional Equipment:   Intra-op Plan:   Post-operative Plan:   Informed Consent:   Plan Discussed with:   Anesthesia Plan Comments: (PAT note written by Myra Gianotti, PA-C.  Has cardiology and pulmonology preoperative input. )        Anesthesia Quick Evaluation

## 2022-06-15 NOTE — Telephone Encounter (Signed)
[name] called me back she stated there was a glitch in the internal EMR system. [name] stated his 49 was being picked up today

## 2022-06-17 DIAGNOSIS — G4733 Obstructive sleep apnea (adult) (pediatric): Secondary | ICD-10-CM | POA: Diagnosis not present

## 2022-06-17 DIAGNOSIS — R0683 Snoring: Secondary | ICD-10-CM | POA: Diagnosis not present

## 2022-06-17 DIAGNOSIS — I10 Essential (primary) hypertension: Secondary | ICD-10-CM | POA: Diagnosis not present

## 2022-06-20 ENCOUNTER — Encounter: Payer: Self-pay | Admitting: Family Medicine

## 2022-06-20 ENCOUNTER — Telehealth (INDEPENDENT_AMBULATORY_CARE_PROVIDER_SITE_OTHER): Payer: PPO | Admitting: Family Medicine

## 2022-06-20 ENCOUNTER — Telehealth: Payer: Self-pay

## 2022-06-20 VITALS — Ht 69.0 in | Wt 213.0 lb

## 2022-06-20 DIAGNOSIS — J440 Chronic obstructive pulmonary disease with acute lower respiratory infection: Secondary | ICD-10-CM | POA: Diagnosis not present

## 2022-06-20 DIAGNOSIS — J209 Acute bronchitis, unspecified: Secondary | ICD-10-CM | POA: Diagnosis not present

## 2022-06-20 MED ORDER — AMOXICILLIN-POT CLAVULANATE 875-125 MG PO TABS: 1.0000 | ORAL_TABLET | Freq: Two times a day (BID) | ORAL | 0 refills | Status: DC

## 2022-06-20 NOTE — Assessment & Plan Note (Signed)
Last primary care and urgent care notes reviewed.  Patient has had recurrence and worsening several times of this issue.  I encouraged him to take a home COVID test.  We will treat with Augmentin for more prolonged course with 10-day treatment.  He was advised to seek medical attention for any worsening symptoms, fevers, change to his chronic shortness of breath, or cough productive of blood.  He was advised to contact his neck surgeon to let them know about his symptoms to see if they want to delay his surgery.  If he is not improving over the next several days he will contact us.  If his COVID test is positive he will contact us.

## 2022-06-20 NOTE — Progress Notes (Signed)
Virtual Visit via video Note  This visit type was conducted due to national recommendations for restrictions regarding the COVID-19 pandemic (e.g. social distancing).  This format is felt to be most appropriate for this patient at this time.  All issues noted in this document were discussed and addressed.  No physical exam was performed (except for noted visual exam findings with Video Visits).   I connected with [name] today at  2:15 PM EST by a video enabled telemedicine application and verified that I am speaking with the correct person using two identifiers. Location patient: home Location provider: home office Persons participating in the virtual visit: patient, provider  I discussed the limitations, risks, security and privacy concerns of performing an evaluation and management service by telephone and the availability of in person appointments. I also discussed with the patient that there may be a patient responsible charge related to this service. The patient expressed understanding and agreed to proceed.  Reason for visit: same day visit  HPI: Bronchitis: Patient notes intermittent issues with bronchitis since September.  He was initially treated with Augmentin and prednisone.  He had recurrence of symptoms in October and was treated with Levaquin and then doxycycline.  He notes about a week ago he finished the doxycycline.  He saw his lung doctor on 06/15/2022 and seemed to be improved at that time.  He finished his steroid taper and they restarted him on Trelegy however the last couple of days he has had recurrence of cough with green productive sputum and chest congestion.  He notes no fever, sore throat, postnasal drip, change to chronic shortness of breath, taste or smell disturbances, body aches, or chills.  No known COVID exposures or flu exposures.  He notes steroids affected his sugar levels.   ROS: See pertinent positives and negatives per HPI.  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 06/2019 Echo (in setting of COVID): EF 35-40%, mild LVH, g1 DD, glob HK; b. 09/2019 Echo: EF 50-55%, Gr1 DD; c. 03/2021 Echo: EF 50-55%, no rwma, mild LVH, GrI DD, nl RV fxn. RVSP 44.67mHg. Mild-mod dil LA. Triv MR. Mild-mod Ao sclerosis.   AAA (abdominal aortic aneurysm) (HCC)    Actinic keratosis    Anginal pain ([place]    BENIGN PROSTATIC HYPERTROPHY, WITH URINARY OBSTRUCTION 09/06/2007   CAD s/p CABG    a. 1990 s/p MI-->CABG x 2; b. 2002 s/p BMS to LCX; c. 06/2015 Cath: LM 50ost, LAD 100ost/p, 664m, D2 nl, LCX  patent stent, RCA 80p (small), LIMA->LAD nl, VG->D2 nl-->Med Rx; d. 05/2020 MV: fixed apical ant/apical defect. No signif ischemia; e. 04/2021 MV: EF 52%, no ischemia.   [facility] ([facility]   bladder   Carotid arterial disease ([facility]   a. 08/2016 Carotid U/S: <39% bilat.   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 did not tolerate CPAP.   Osteoarthritis    PAD (peripheral artery disease) ([place])    a. 07/2017 LE duplex: RSFA 75-18m LSFA 75-979m50-74d; c. 08/2018 Periph Angio: No signif AoIliac dzs. Mod-sev Ca2+ RSFA w/ diff dzs throughout- 3 vessel runoff. Borderline signif LSFA dzs w/ mod-sev  Ca2+ vessels and 3 vessel runoff below the knee-->Med rx.   Pneumonia 07/2014   [facility] hospitalization   Pneumonia due to COVID-19 virus 06/02/2019   Pre-diabetes    Prostatitis, chronic    PSA, INCREASED 07/09/2008   Spinal stenosis of lumbar region     Past Surgical History:  Procedure Laterality Date   ABDOMINAL AORTOGRAM W/LOWER EXTREMITY [name] 09/12/2018   Procedure: ABDOMINAL AORTOGRAM W/LOWER EXTREMITY;  Surgeon: ArWellington HampshireMD;  [facility];  Service: Cardiovascular;  Laterality: [name];   APPENDECTOMY     rupture   BACK SURG[facility]     [facility]   BLADDER INSTILLATION [name] 10/19/2021   Procedure: BLADDER INSTILLATION OF GEMCITABINE;  Surgeon: StAbbie SonsMD;  Location: [facility];  Service: Urology;  Laterality: [name];   CARDIAC CATHETERIZATION [name] 07/07/2015   Procedure: Left Heart Cath and Cors/Grafts Angiography;  Surgeon: TiMinna MerrittsMD;  [facility];  Service: Cardiovascular;  Laterality: [name];   CATARACT EXTRACTION, BILATERAL  2009   CERVICAL SPINE SURGERY  12/2016   cervical stenosis (JArnoldo [name]  COLONOSCOPY  03/2010   HP polyp, diverticulosis, rpt 10 yrs ([name])   CORONARY ANGIOPLASTY  11/30/2000   LCX   CORONARY ARTERY BYPASS GRAFT  1990   3 vessel    CYSTOSCOPY  12/23/2010   [facility]   EYE SURGERY     cataract   JOINT REPLACEMENT     KNEE ARTHROSCOPY Right    x2   LAMINOTOMY  1986   L5/S1 lumbar laminotomy for two ruptured discs/fusion   LUMBAR LAMINECTOMY/DECOMPRESSION MICRODISCECTOMY [name] 07/13/2016   Procedure: LUMBAR TWO-THREE, LUMBAR THREE-FOUR, LUMBAR FOUR-FIVE LAMINECTOMY AND FORAMINOTOMY;  Surgeon: JeNewman PiesMD;  [facility]: [facility] Service: Neurosurgery;  Laterality: [name];  LAMINECTOMY AND FORAMINOTOMY L2-L3, L3-L4,L4-L5   LUMBAR SPINE SURGERY  06/2020   Bilateral redo laminectomy/laminotomy/foraminotomies/medial facetectomy to decompress the bilateral L4 and L5 nerve roots (JArnoldo [name]   TOTAL HIP ARTHROPLASTY Bilateral [id number] TOTAL KNEE ARTHROPLASTY Right 03/06/2017   Procedure: RIGHT TOTAL KNEE ARTHROPLASTY;  Surgeon: AlGaynelle ArabianMD;  [facility];  Service: Orthopedics;  Laterality: Right;   TRANSURETHRAL RESECTION OF BLADDER TUMOR [name] 10/19/2021   Procedure: TRANSURETHRAL RESECTION OF BLADDER TUMOR (TURBT);  Surgeon: StAbbie SonsMD;  Location: [facility];  Service: Urology;  Laterality: [name];    Family History  Problem Relation Age of Onset   Stroke Mother    Heart attack Mother    Diabetes Mother    Stroke Father    Heart disease Father    Polycystic kidney disease Father    Cancer Sister        throat   Polycystic kidney disease Sister    Diabetes Brother        1/2 brother   Cancer Other        5/7 nephews with lung  cancer    SOCIAL HX: Former smoker   Current Outpatient Medications:    acetaminophen (TYLENOL) 500 MG tablet, Take 1,000 mg by mouth every 8 (eight) hours as needed for moderate pain., Disp: , Rfl:    amoxicillin-clavulanate (AUGMENTIN) 875-125 MG tablet, Take 1 tablet by mouth 2 (two) times daily., Disp: 20 tablet, Rfl: 0   aspirin EC 81 MG EC tablet, Take 1 tablet (81 mg total) by mouth daily., Disp: 30 tablet, Rfl: 0   atorvastatin (LIPITOR) 20 MG tablet, TAKE 1 TABLET BY MOUTH DAILY., Disp: 90 tablet, Rfl: 3   Azelastine HCl (ASTEPRO) 0.15 % SOLN, Place 1 spray into the nose at bedtime as needed (Sinus congestion, allergies)., Disp: 30 mL, Rfl: 11   docusate sodium (COLACE) 100 MG capsule, Take 100 mg by mouth 2 (two) times daily as needed for mild constipation., Disp: , Rfl:    ezetimibe (ZETIA) 10 MG tablet, TAKE 1 TABLET BY MOUTH ONCE DAILY., Disp: 90 tablet, Rfl: 2   famotidine (PEPCID) 20 MG tablet, Take 20 mg by mouth daily., Disp: , Rfl:    Fluticasone-Umeclidin-Vilant (TRELEGY ELLIPTA) 200-62.5-25 MCG/ACT AEPB, Inhale 1 puff into the lungs daily., Disp: 28 each, Rfl: 0   Fluticasone-Umeclidin-Vilant (TRELEGY ELLIPTA)  200-62.5-25 MCG/ACT AEPB, Inhale 1 puff into the lungs daily., Disp: 28 each, Rfl: 0   furosemide (LASIX) 20 MG tablet, Take 1 tablet by mouth every third day as directed., Disp: 30 tablet, Rfl: 2   gabapentin (NEURONTIN) 600 MG tablet, Take 600 mg by mouth 3 (three) times daily., Disp: , Rfl:    ipratropium-albuterol (DUONEB) 0.5-2.5 (3) MG/3ML SOLN, Take 3 mLs by nebulization every 6 (six) hours as needed., Disp: 360 mL, Rfl: 11   isosorbide mononitrate (IMDUR) 30 MG 24 hr tablet, Take 0.5 tablets (15 mg total) by mouth daily. (Patient taking differently: Take 15 mg by mouth at bedtime.), Disp: 45 tablet, Rfl: 3   losartan (COZAAR) 25 MG tablet, TAKE 1 TABLET BY MOUTH DAILY, Disp: 90 tablet, Rfl: 2   nitroGLYCERIN (NITROSTAT) 0.4 MG SL tablet, Place 1 tablet (0.4 mg total) under the tongue every 5 (five) minutes as needed for chest pain., Disp: 25 tablet, Rfl: 3   polyethylene glycol (MIRALAX / GLYCOLAX) 17 g packet, Take 17 g by mouth daily as needed for mild constipation. , Disp: , Rfl:    predniSONE (DELTASONE) 10 MG tablet, Take 4 pills once daily by mouth for 3 days, then 3 pills daily for 3 days, then 2 pills daily for 3 days then 1 pill daily for 3 days then stop, Disp: 30 tablet, Rfl: 0   PROAIR HFA 108 (90 Base) MCG/ACT inhaler, TAKE 2 PUFFS INTO LUNGS EVERY 6 HOURS ASNEEDED FOR WHEEZING OR SHORTNESS OF BREATH, Disp: 8.5 g, Rfl: 6   ranolazine (RANEXA) 500 MG 12 hr tablet, TAKE ONE (1) TABLET BY MOUTH TWO TIMES PER DAY, Disp: 180 tablet, Rfl: 3   tamsulosin (FLOMAX) 0.4 MG CAPS capsule, TAKE 1 CAPSULE BY MOUTH AT BEDTIME, Disp: 90 capsule, Rfl: 1   trospium (SANCTURA) 20 MG tablet, Take 1 tablet (20 mg total) by mouth 2 (two) times daily., Disp: 60 tablet, Rfl: 2  EXAM:  VITALS per patient if applicable:  GENERAL: alert, oriented, appears well and in no acute distress  HEENT: atraumatic, conjunttiva clear, no obvious abnormalities on inspection of external nose and ears  NECK: normal  movements of the head and neck  LUNGS: on inspection no signs of respiratory distress, breathing  rate appears normal, no obvious gross SOB, gasping or wheezing  CV: no obvious cyanosis  MS: moves all visible extremities without noticeable abnormality  PSYCH/NEURO: pleasant and cooperative, no obvious depression or anxiety, speech and thought processing grossly intact  ASSESSMENT AND PLAN:  Discussed the following assessment and plan:  Problem List Items Addressed This Visit     Acute bronchitis with COPD ([place]) - Primary    Last primary care and urgent care notes reviewed.  Patient has had recurrence and worsening several times of this issue.  I encouraged him to take a home COVID test.  We will treat with Augmentin for more prolonged course with 10-day treatment.  He was advised to seek medical attention for any worsening symptoms, fevers, change to his chronic shortness of breath, or cough productive of blood.  He was advised to contact his neck surgeon to let them know about his symptoms to see if they want to delay his surgery.  If he is not improving over the next several days he will contact us.  If his COVID test is positive he will contact us.      Relevant Medications   amoxicillin-clavulanate (AUGMENTIN) 875-125 MG tablet    Return if symptoms worsen or fail to improve.   I discussed the assessment and treatment plan with the patient. The patient was provided an opportunity to ask questions and all were answered. The patient agreed with the plan and demonstrated an understanding of the instructions.   The patient was advised to call back or seek an in-person evaluation if the symptoms worsen or if the condition fails to improve as anticipated.    Tommi Rumps, MD

## 2022-06-20 NOTE — Telephone Encounter (Signed)
Incoming call from pt on triage line who states that he has been having burning with urination x 3 days, dysuria is associated with "bladder pain". Patient would like to be seen to rule out infection. Patient denies fever and chills. Patient is scheduled for surgery this coming Thursday with Dr. [name], advised pt that he needs to call surgeon's office immediately to make them aware as this may delay his case. Pt gave verbal understanding.

## 2022-06-21 ENCOUNTER — Encounter: Payer: Self-pay | Admitting: Physician Assistant

## 2022-06-21 ENCOUNTER — Ambulatory Visit: Payer: PPO | Admitting: Physician Assistant

## 2022-06-21 VITALS — BP 121/66 | HR 75 | Ht 69.0 in | Wt 213.0 lb

## 2022-06-21 DIAGNOSIS — N3941 Urge incontinence: Secondary | ICD-10-CM

## 2022-06-21 DIAGNOSIS — R300 Dysuria: Secondary | ICD-10-CM

## 2022-06-21 DIAGNOSIS — R339 Retention of urine, unspecified: Secondary | ICD-10-CM

## 2022-06-21 LAB — MICROSCOPIC EXAMINATION

## 2022-06-21 LAB — URINALYSIS, COMPLETE
Bilirubin, UA: NEGATIVE
Glucose, UA: NEGATIVE
Ketones, UA: NEGATIVE
Nitrite, UA: NEGATIVE
Protein,UA: NEGATIVE
RBC, UA: NEGATIVE
Specific Gravity, UA: <1.005 — ABNORMAL LOW (ref 1.005–1.030)
Urobilinogen, Ur: 0.2 mg/dL (ref 0.2–1.0)
pH, UA: 5.5 (ref 5.0–7.5)

## 2022-06-21 LAB — BLADDER SCAN AMB NON-IMAGING: Scan Result: 306

## 2022-06-21 NOTE — Patient Instructions (Addendum)
Continue your antibiotics as prescribed. Continue Flomax (tamsulosin). Stop Detrol (tolterodine).  Keep your scheduled follow up appointment with Dr. Bernardo Heater next month.

## 2022-06-21 NOTE — Progress Notes (Signed)
06/21/2022 4:31 PM   [name] 03/15/1941 [id number]  CC: Chief Complaint  Patient presents with   Urinary Incontinence    HPI: [name] is a 81 y.o. male with PMH BPH with LUTS on tamsulosin and previously on tolterodine and bladder cancer who presents today for evaluation of possible UTI.   Today he reports a 3 to 4-day history of lower abdominal pain and dysuria.  He started Augmentin yesterday per his PCP for management of acute bronchitis with COPD.  He has not yet noticed a change in his urinary symptoms despite starting antibiotics.  When I last saw him, we switched from [place] to trospium due to cost.  Today he reports that the trospium was also too expensive, so he resumed tolterodine.  In-office UA today positive for trace leukocytes; urine microscopy with 6-10 WBCs/HPF. PVR 374m.  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 06/2019 Echo (in setting of COVID): EF 35-40%, mild LVH, g1 DD, glob HK; b. 09/2019 Echo: EF 50-55%, Gr1 DD; c. 03/2021 Echo: EF 50-55%, no rwma, mild LVH, GrI DD, nl RV fxn. RVSP 44.728mg. Mild-mod dil LA. Triv MR. Mild-mod Ao sclerosis.   AAA (abdominal aortic aneurysm) (HCC)    Actinic keratosis    Anginal pain ([place]   BENIGN PROSTATIC HYPERTROPHY, WITH URINARY OBSTRUCTION 09/06/2007   CAD s/p CABG    a. 1990 s/p MI-->CABG x 2; b. 2002 s/p BMS to LCX; c. 06/2015 Cath: LM 50ost, LAD 100ost/p, 6020m D2 nl, LCX  patent stent, RCA 80p (small), LIMA->LAD nl, VG->D2 nl-->Med Rx; d. 05/2020 MV: fixed apical ant/apical defect. No signif ischemia; e. 04/2021 MV: EF 52%, no ischemia.   Cancer ([facility]   bladder   Carotid arterial disease ([facility]CC[place]  a. 08/2016 Carotid U/S: <39% bilat.   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 did not tolerate CPAP.   Osteoarthritis    PAD (peripheral artery disease) ([place]  a. 07/2017 LE duplex: RSFA 75-7m80mFA 75-7m,79m74d; c. 08/2018 Periph Angio: No signif AoIliac dzs. Mod-sev Ca2+ RSFA w/ diff dzs throughout- 3 vessel runoff. Borderline signif LSFA dzs w/ mod-sev Ca2+ vessels and 3 vessel runoff below the knee-->Med rx.   Pneumonia 07/2014   [facility] hospitalization   Pneumonia due to COVID-19 virus 06/02/2019   Pre-diabetes    Prostatitis, chronic    PSA, INCREASED 07/09/2008   Spinal stenosis of lumbar region     Surgical History: Past Surgical History:  Procedure Laterality Date   ABDOMINAL AORTOGRAM W/LOWER EXTREMITY [name] 09/12/2018  Procedure: ABDOMINAL AORTOGRAM W/LOWER EXTREMITY;  Surgeon: [name], MD;  Location: [facility];  Service: Cardiovascular;  Laterality: [name];   APPENDECTOMY     rupture   BACK SURGERY     [facility]   BLADDER INSTILLATION [name] 10/19/2021   Procedure: BLADDER INSTILLATION OF GEMCITABINE;  Surgeon: Abbie Sons, MD;  Location: [facility];  Service: Urology;  Laterality: [name];   CARDIAC CATHETERIZATION [name] 07/07/2015   Procedure: Left Heart Cath and Cors/Grafts Angiography;  Surgeon: [name], MD;  Location: [place] [facility];  Service: Cardiovascular;  Laterality: [name];   CATARACT EXTRACTION, BILATERAL  2009    CERVICAL SPINE SURGERY  12/2016   cervical stenosis [name])   COLONOSCOPY  03/2010   HP polyp, diverticulosis, rpt 10 yrs ([name])   CORONARY ANGIOPLASTY  11/30/2000   LCX   CORONARY ARTERY BYPASS GRAFT  1990   3 vessel    CYSTOSCOPY  12/23/2010   [facility]   EYE SURGERY     cataract   JOINT REPLACEMENT     KNEE ARTHROSCOPY Right    x2   LAMINOTOMY  1986   L5/S1 lumbar laminotomy for two ruptured discs/fusion   LUMBAR LAMINECTOMY/DECOMPRESSION MICRODISCECTOMY [name] 07/13/2016   Procedure: LUMBAR TWO-THREE, LUMBAR THREE-FOUR, LUMBAR FOUR-FIVE LAMINECTOMY AND FORAMINOTOMY;  Surgeon: [name], MD;  Location: [place];  Service: Neurosurgery;  Laterality: [name];  LAMINECTOMY AND FORAMINOTOMY L2-L3, L3-L4,L4-L5   LUMBAR SPINE SURGERY  06/2020   Bilateral redo laminectomy/laminotomy/foraminotomies/medial facetectomy to decompress the bilateral L4 and L5 nerve roots [name])   TOTAL HIP ARTHROPLASTY Bilateral [id number]   TOTAL KNEE ARTHROPLASTY Right 03/06/2017   Procedure: RIGHT TOTAL KNEE ARTHROPLASTY;  Surgeon: Gaynelle Arabian, MD;  [facility];  Service: Orthopedics;  Laterality: Right;   TRANSURETHRAL RESECTION OF BLADDER TUMOR [name] 10/19/2021   Procedure: TRANSURETHRAL RESECTION OF BLADDER TUMOR (TURBT);  Surgeon: Abbie Sons, MD;  Location: [facility];  Service: Urology;  Laterality: [name];    Home Medications:  Allergies as of 06/21/2022       Reactions   Vioxx [rofecoxib] Other (See Comments)   Hemorrhage    Spiriva Respimat [tiotropium Bromide Monohydrate] Other (See Comments)   Elevated bp   Contrast Media [iodinated Contrast Media] Itching, Rash   Delayed reaction post abdominal aortagram.    Morphine Nausea Only, Other (See Comments)   Irritability        Medication List        Accurate as of June 21, 2022  4:31 PM. If you have any questions, ask your nurse or doctor.          STOP taking these medications    trospium 20 MG tablet Commonly known as:  SANCTURA Stopped by: Debroah Loop, PA-C       TAKE these medications    acetaminophen 500 MG tablet Commonly known as: TYLENOL Take 1,000 mg by mouth every 8 (eight) hours as needed for moderate pain.   amoxicillin-clavulanate 875-125 MG tablet Commonly known as: AUGMENTIN Take 1 tablet by mouth 2 (two) times daily.   aspirin EC 81 MG tablet Take 1 tablet (81 mg total) by mouth daily.   atorvastatin 20 MG tablet Commonly known as: LIPITOR TAKE 1 TABLET BY MOUTH DAILY.   Azelastine HCl 0.15 % Soln Commonly known as: Astepro Place 1 spray into the nose at bedtime as needed (Sinus congestion, allergies).   docusate sodium 100 MG capsule Commonly known as: COLACE Take 100  mg by mouth 2 (two) times daily as needed for mild constipation.   ezetimibe 10 MG tablet Commonly known as: ZETIA TAKE 1 TABLET BY MOUTH ONCE DAILY.   famotidine 20 MG tablet Commonly known as: PEPCID Take 20 mg by mouth daily.   furosemide 20 MG tablet Commonly known as: LASIX Take 1 tablet by mouth every third day as directed.   gabapentin 600 MG tablet Commonly known as: NEURONTIN Take 600 mg by mouth 3 (three) times daily.   ipratropium-albuterol 0.5-2.5 (3) MG/3ML Soln Commonly known as: DUONEB Take 3 mLs by nebulization every 6 (six) hours as needed.   isosorbide mononitrate 30 MG 24 hr tablet Commonly known as: IMDUR Take 0.5 tablets (15 mg total) by mouth daily. What changed: when to take this   losartan 25 MG tablet Commonly known as: COZAAR TAKE 1 TABLET BY MOUTH DAILY   nitroGLYCERIN 0.4 MG SL tablet Commonly known as: NITROSTAT Place 1 tablet (0.4 mg total) under the tongue every 5 (five) minutes as needed for chest pain.   polyethylene glycol 17 g packet Commonly known as: MIRALAX / GLYCOLAX Take 17 g by mouth daily as needed for mild constipation.   predniSONE 10 MG tablet Commonly known as: DELTASONE Take 4 pills once daily by mouth for 3 days, then 3 pills  daily for 3 days, then 2 pills daily for 3 days then 1 pill daily for 3 days then stop   ProAir HFA 108 (90 Base) MCG/ACT inhaler Generic drug: albuterol TAKE 2 PUFFS INTO LUNGS EVERY 6 HOURS ASNEEDED FOR WHEEZING OR SHORTNESS OF BREATH   ranolazine 500 MG 12 hr tablet Commonly known as: RANEXA TAKE ONE (1) TABLET BY MOUTH TWO TIMES PER DAY   tamsulosin 0.4 MG Caps capsule Commonly known as: FLOMAX TAKE 1 CAPSULE BY MOUTH AT BEDTIME   Trelegy Ellipta 200-62.5-25 MCG/ACT Aepb Generic drug: Fluticasone-Umeclidin-Vilant Inhale 1 puff into the lungs daily.   Trelegy Ellipta 200-62.5-25 MCG/ACT Aepb Generic drug: Fluticasone-Umeclidin-Vilant Inhale 1 puff into the lungs daily.        Allergies:  Allergies  Allergen Reactions   Vioxx [Rofecoxib] Other (See Comments)    Hemorrhage    Spiriva Respimat [Tiotropium Bromide Monohydrate] Other (See Comments)    Elevated bp   Contrast Media [Iodinated Contrast Media] Itching and Rash    Delayed reaction post abdominal aortagram.    Morphine Nausea Only and Other (See Comments)    Irritability     Family History: Family History  Problem Relation Age of Onset   Stroke Mother    Heart attack Mother    Diabetes Mother    Stroke Father    Heart disease Father    Polycystic kidney disease Father    Cancer Sister        throat   Polycystic kidney disease Sister    Diabetes Brother        1/2 brother   Cancer Other        5/7 nephews with lung cancer    Social History:   reports that he quit smoking about 33 years ago. His smoking use included cigarettes. He has a 25.00 pack-year smoking history. He has been exposed to tobacco smoke. He has never used smokeless tobacco. He reports that he does not drink alcohol and does not use drugs.  Physical Exam: BP 121/66   Pulse 75   Ht '5\' 9"'$  (1.753 m)   Wt 213 lb (96.6 kg)   BMI 31.45 kg/m  Constitutional:  Alert and oriented, no acute distress, nontoxic appearing HEENT: [place],  AT Cardiovascular: No clubbing, cyanosis, or edema Respiratory: Normal respiratory effort, no increased work of breathing Skin: No rashes, bruises or suspicious lesions Neurologic: Grossly intact, no focal deficits, moving all 4 extremities Psychiatric: Normal mood and affect  Laboratory Data: Results for orders placed or performed in visit on 06/21/22  Microscopic Examination   Urine  Result Value Ref Range   WBC, UA 6-10 (A) 0 - 5 /hpf   RBC, Urine 0-2 0 - 2 /hpf   Epithelial Cells (non renal) 0-10 0 - 10 /hpf   Bacteria, UA Few None seen/Few  Urinalysis, Complete  Result Value Ref Range   Specific Gravity, UA <1.005 (L) 1.005 - 1.030   pH, UA 5.5 5.0 - 7.5   Color, UA Yellow Yellow   Appearance Ur Clear Clear   Leukocytes,UA Trace (A) Negative   Protein,UA Negative Negative/Trace   Glucose, UA Negative Negative   Ketones, UA Negative Negative   RBC, UA Negative Negative   Bilirubin, UA Negative Negative   Urobilinogen, Ur 0.2 0.2 - 1.0 mg/dL   Nitrite, UA Negative Negative   Microscopic Examination See below:   Bladder Scan (Post Void Residual) in office  Result Value Ref Range   Scan Result 306    Assessment & Plan:   1. Dysuria His UA today is notable for mild pyuria after starting Augmentin for acute bronchitis yesterday.  We discussed that his dysuria could represent a UTI already improving on culture appropriate antibiotics versus #2 below.  I encouraged him to continue Augmentin as prescribed by his PCP and will repeat a urine culture today. - Urinalysis, Complete - CULTURE, URINE COMPREHENSIVE  2. Incomplete bladder emptying His residual is slightly elevated compared to prior once again, however he is not in overt retention.  We discussed that this could be contributory to his dysuria.  I offered him catheterization today, which he declined.  I recommended that he continue Flomax and stop tolterodine for now and stay off it for the next month to allow for maximal  bladder emptying.  He is already scheduled for follow-up cystoscopy with Dr. Bernardo Heater next month, we will plan for repeat PVR at that appointment. - Bladder Scan (Post Void Residual) in office  Return if symptoms worsen or fail to improve.  Debroah Loop, PA-C  [facility] [address] [place], [place] [address] [phone]

## 2022-06-23 ENCOUNTER — Ambulatory Visit (HOSPITAL_COMMUNITY): Admission: RE | Admit: 2022-06-23 | Payer: PPO | Source: Home / Self Care | Admitting: Neurosurgery

## 2022-06-23 SURGERY — ANTERIOR CERVICAL DECOMPRESSION/DISCECTOMY FUSION 2 LEVELS
Anesthesia: General

## 2022-06-25 LAB — CULTURE, URINE COMPREHENSIVE

## 2022-06-30 ENCOUNTER — Other Ambulatory Visit: Payer: Self-pay | Admitting: Neurosurgery

## 2022-07-06 ENCOUNTER — Ambulatory Visit: Payer: PPO | Admitting: Podiatry

## 2022-07-06 DIAGNOSIS — R0981 Nasal congestion: Secondary | ICD-10-CM | POA: Diagnosis not present

## 2022-07-06 DIAGNOSIS — H6982 Other specified disorders of Eustachian tube, left ear: Secondary | ICD-10-CM | POA: Diagnosis not present

## 2022-07-08 ENCOUNTER — Encounter (HOSPITAL_COMMUNITY): Payer: Self-pay | Admitting: Neurosurgery

## 2022-07-08 NOTE — Progress Notes (Signed)
PCP - Dr Gasper Sells Cardiologist - Dr Ida Rogue  Chest x-ray - 06/06/22 EKG - 05/19/22 Stress Test - 04/22/21 ECHO - 03/22/21 Cardiac Cath - 07/07/15  ICD Pacemaker/Loop - n/a  Sleep Study -  Yes CPAP - uses CPAP nightly  Aspirin Instructions: Follow your surgeon's instructions on when to stop aspirin prior to surgery,  If no instructions were given by your surgeon then you will need to call the office for those instructions.  ERAS: Clear liquids til 9 am DOS.  Anesthesia review: Yes  STOP now taking any Aspirin (unless otherwise instructed by your surgeon), Aleve, Naproxen, Ibuprofen, Motrin, Advil, Goody's, BC's, all herbal medications, fish oil, and all vitamins.   Coronavirus Screening Do you have any of the following symptoms:  Cough Yes  Fever (>100.21F)  yes/no: No Runny nose yes/no: No Sore throat yes/no: No Difficulty breathing/shortness of breath  yes/no: No  Have you traveled in the last 14 days and where? yes/no: No  Patient and wife [name] verbalized understanding of instructions that were given via phone.

## 2022-07-11 ENCOUNTER — Ambulatory Visit (HOSPITAL_BASED_OUTPATIENT_CLINIC_OR_DEPARTMENT_OTHER): Payer: PPO | Admitting: Certified Registered"

## 2022-07-11 ENCOUNTER — Ambulatory Visit (HOSPITAL_COMMUNITY)
Admission: RE | Disposition: A | Discharge status: Home / Self Care | Payer: Self-pay | Source: Home / Self Care | Attending: Neurosurgery

## 2022-07-11 ENCOUNTER — Ambulatory Visit (HOSPITAL_COMMUNITY): Payer: PPO

## 2022-07-11 ENCOUNTER — Ambulatory Visit (HOSPITAL_COMMUNITY)
Admission: RE | Admit: 2022-07-11 | Discharge: 2022-07-12 | Disposition: A | Discharge status: Home / Self Care | Payer: PPO | Attending: Neurosurgery | Admitting: Neurosurgery

## 2022-07-11 ENCOUNTER — Encounter (HOSPITAL_COMMUNITY): Payer: Self-pay | Admitting: Neurosurgery

## 2022-07-11 ENCOUNTER — Ambulatory Visit (HOSPITAL_COMMUNITY): Payer: PPO | Admitting: Certified Registered"

## 2022-07-11 ENCOUNTER — Other Ambulatory Visit: Payer: Self-pay

## 2022-07-11 DIAGNOSIS — M4802 Spinal stenosis, cervical region: Secondary | ICD-10-CM

## 2022-07-11 DIAGNOSIS — Z8551 Personal history of malignant neoplasm of bladder: Secondary | ICD-10-CM | POA: Diagnosis not present

## 2022-07-11 DIAGNOSIS — Z955 Presence of coronary angioplasty implant and graft: Secondary | ICD-10-CM | POA: Diagnosis not present

## 2022-07-11 DIAGNOSIS — I2510 Atherosclerotic heart disease of native coronary artery without angina pectoris: Secondary | ICD-10-CM | POA: Insufficient documentation

## 2022-07-11 DIAGNOSIS — E1122 Type 2 diabetes mellitus with diabetic chronic kidney disease: Secondary | ICD-10-CM | POA: Insufficient documentation

## 2022-07-11 DIAGNOSIS — G4730 Sleep apnea, unspecified: Secondary | ICD-10-CM | POA: Insufficient documentation

## 2022-07-11 DIAGNOSIS — Z8719 Personal history of other diseases of the digestive system: Secondary | ICD-10-CM | POA: Insufficient documentation

## 2022-07-11 DIAGNOSIS — I252 Old myocardial infarction: Secondary | ICD-10-CM | POA: Diagnosis not present

## 2022-07-11 DIAGNOSIS — M4722 Other spondylosis with radiculopathy, cervical region: Secondary | ICD-10-CM

## 2022-07-11 DIAGNOSIS — Z87891 Personal history of nicotine dependence: Secondary | ICD-10-CM

## 2022-07-11 DIAGNOSIS — I5032 Chronic diastolic (congestive) heart failure: Secondary | ICD-10-CM | POA: Diagnosis not present

## 2022-07-11 DIAGNOSIS — Z981 Arthrodesis status: Secondary | ICD-10-CM | POA: Diagnosis not present

## 2022-07-11 DIAGNOSIS — N183 Chronic kidney disease, stage 3 unspecified: Secondary | ICD-10-CM | POA: Insufficient documentation

## 2022-07-11 DIAGNOSIS — J449 Chronic obstructive pulmonary disease, unspecified: Secondary | ICD-10-CM | POA: Diagnosis not present

## 2022-07-11 DIAGNOSIS — M4322 Fusion of spine, cervical region: Secondary | ICD-10-CM | POA: Diagnosis not present

## 2022-07-11 DIAGNOSIS — I110 Hypertensive heart disease with heart failure: Secondary | ICD-10-CM | POA: Diagnosis not present

## 2022-07-11 DIAGNOSIS — Z951 Presence of aortocoronary bypass graft: Secondary | ICD-10-CM | POA: Diagnosis not present

## 2022-07-11 DIAGNOSIS — I130 Hypertensive heart and chronic kidney disease with heart failure and stage 1 through stage 4 chronic kidney disease, or unspecified chronic kidney disease: Secondary | ICD-10-CM | POA: Diagnosis not present

## 2022-07-11 DIAGNOSIS — K219 Gastro-esophageal reflux disease without esophagitis: Secondary | ICD-10-CM | POA: Insufficient documentation

## 2022-07-11 DIAGNOSIS — I509 Heart failure, unspecified: Secondary | ICD-10-CM | POA: Diagnosis not present

## 2022-07-11 HISTORY — PX: ANTERIOR CERVICAL DECOMP/DISCECTOMY FUSION: SHX1161

## 2022-07-11 SURGERY — ANTERIOR CERVICAL DECOMPRESSION/DISCECTOMY FUSION 2 LEVELS
Anesthesia: General

## 2022-07-11 MED ORDER — OXYCODONE HCL 5 MG/5ML PO SOLN: 5.0000 mg | Freq: Once | ORAL | Status: AC | PRN

## 2022-07-11 MED ORDER — ROCURONIUM BROMIDE 10 MG/ML (PF) SYRINGE
PREFILLED_SYRINGE | INTRAVENOUS | Status: AC
  Filled 2022-07-11: qty 10

## 2022-07-11 MED ORDER — OXYCODONE HCL 5 MG PO TABS: 5.0000 mg | ORAL_TABLET | Freq: Once | ORAL | Status: AC | PRN

## 2022-07-11 MED ORDER — ONDANSETRON HCL 4 MG/2ML IJ SOLN: 4.0000 mg | Freq: Four times a day (QID) | INTRAMUSCULAR | Status: DC | PRN

## 2022-07-11 MED ORDER — FLUTICASONE FUROATE-VILANTEROL 200-25 MCG/ACT IN AEPB
1.0000 | INHALATION_SPRAY | Freq: Every day | RESPIRATORY_TRACT | Status: DC
  Filled 2022-07-11: qty 28

## 2022-07-11 MED ORDER — ACETAMINOPHEN 325 MG PO TABS: 650.0000 mg | ORAL_TABLET | ORAL | Status: DC | PRN

## 2022-07-11 MED ORDER — CEFAZOLIN SODIUM-DEXTROSE 2-4 GM/100ML-% IV SOLN
INTRAVENOUS | Status: AC
  Filled 2022-07-11: qty 100

## 2022-07-11 MED ORDER — RANOLAZINE ER 500 MG PO TB12
500.0000 mg | ORAL_TABLET | Freq: Two times a day (BID) | ORAL | Status: DC
  Administered 2022-07-11: 500 mg via ORAL
  Filled 2022-07-11 (×3): qty 1

## 2022-07-11 MED ORDER — CYCLOBENZAPRINE HCL 10 MG PO TABS: 10.0000 mg | ORAL_TABLET | Freq: Three times a day (TID) | ORAL | Status: DC | PRN

## 2022-07-11 MED ORDER — BACITRACIN ZINC 500 UNIT/GM EX OINT
TOPICAL_OINTMENT | CUTANEOUS | Status: DC | PRN
  Administered 2022-07-11: 1 via TOPICAL

## 2022-07-11 MED ORDER — ATORVASTATIN CALCIUM 10 MG PO TABS
20.0000 mg | ORAL_TABLET | Freq: Every day | ORAL | Status: DC
  Administered 2022-07-11: 20 mg via ORAL
  Filled 2022-07-11: qty 2

## 2022-07-11 MED ORDER — ONDANSETRON HCL 4 MG/2ML IJ SOLN
INTRAMUSCULAR | Status: DC | PRN
  Administered 2022-07-11: 4 mg via INTRAVENOUS

## 2022-07-11 MED ORDER — LACTATED RINGERS IV SOLN: INTRAVENOUS | Status: DC

## 2022-07-11 MED ORDER — ONDANSETRON HCL 4 MG/2ML IJ SOLN
INTRAMUSCULAR | Status: AC
  Filled 2022-07-11: qty 2

## 2022-07-11 MED ORDER — DEXAMETHASONE SODIUM PHOSPHATE 10 MG/ML IJ SOLN
INTRAMUSCULAR | Status: AC
  Filled 2022-07-11: qty 1

## 2022-07-11 MED ORDER — FENTANYL CITRATE (PF) 250 MCG/5ML IJ SOLN
INTRAMUSCULAR | Status: DC | PRN
  Administered 2022-07-11 (×4): 50 ug via INTRAVENOUS

## 2022-07-11 MED ORDER — FENTANYL CITRATE (PF) 100 MCG/2ML IJ SOLN
25.0000 ug | INTRAMUSCULAR | Status: DC | PRN
  Administered 2022-07-11: 25 ug via INTRAVENOUS

## 2022-07-11 MED ORDER — BUPIVACAINE-EPINEPHRINE (PF) 0.5% -1:200000 IJ SOLN
INTRAMUSCULAR | Status: DC | PRN
  Administered 2022-07-11: 10 mL via PERINEURAL

## 2022-07-11 MED ORDER — CEFAZOLIN SODIUM-DEXTROSE 2-4 GM/100ML-% IV SOLN
2.0000 g | Freq: Three times a day (TID) | INTRAVENOUS | Status: AC
  Administered 2022-07-11 – 2022-07-12 (×2): 2 g via INTRAVENOUS
  Filled 2022-07-11 (×2): qty 100

## 2022-07-11 MED ORDER — THROMBIN 5000 UNITS EX SOLR
OROMUCOSAL | Status: DC | PRN
  Administered 2022-07-11 (×2): 5 mL via TOPICAL

## 2022-07-11 MED ORDER — ALBUTEROL SULFATE HFA 108 (90 BASE) MCG/ACT IN AERS: 2.0000 | INHALATION_SPRAY | Freq: Four times a day (QID) | RESPIRATORY_TRACT | Status: DC | PRN

## 2022-07-11 MED ORDER — PHENYLEPHRINE HCL-NACL 20-0.9 MG/250ML-% IV SOLN
INTRAVENOUS | Status: DC | PRN
  Administered 2022-07-11: 50 ug/min via INTRAVENOUS

## 2022-07-11 MED ORDER — UMECLIDINIUM BROMIDE 62.5 MCG/ACT IN AEPB
1.0000 | INHALATION_SPRAY | Freq: Every day | RESPIRATORY_TRACT | Status: DC
  Filled 2022-07-11: qty 7

## 2022-07-11 MED ORDER — BUPIVACAINE-EPINEPHRINE (PF) 0.5% -1:200000 IJ SOLN
INTRAMUSCULAR | Status: AC
  Filled 2022-07-11: qty 30

## 2022-07-11 MED ORDER — THROMBIN 5000 UNITS EX SOLR
CUTANEOUS | Status: AC
  Filled 2022-07-11: qty 5000

## 2022-07-11 MED ORDER — PHENOL 1.4 % MT LIQD: 1.0000 | OROMUCOSAL | Status: DC | PRN

## 2022-07-11 MED ORDER — DEXAMETHASONE SODIUM PHOSPHATE 10 MG/ML IJ SOLN
INTRAMUSCULAR | Status: DC | PRN
  Administered 2022-07-11: 10 mg via INTRAVENOUS

## 2022-07-11 MED ORDER — UMECLIDINIUM BROMIDE 62.5 MCG/ACT IN AEPB: 1.0000 | INHALATION_SPRAY | Freq: Every day | RESPIRATORY_TRACT | Status: DC

## 2022-07-11 MED ORDER — HYDROMORPHONE HCL 1 MG/ML IJ SOLN: 0.5000 mg | INTRAMUSCULAR | Status: DC | PRN

## 2022-07-11 MED ORDER — FENTANYL CITRATE (PF) 100 MCG/2ML IJ SOLN
INTRAMUSCULAR | Status: AC
  Administered 2022-07-11: 25 ug via INTRAVENOUS
  Filled 2022-07-11: qty 2

## 2022-07-11 MED ORDER — PHENYLEPHRINE 80 MCG/ML (10ML) SYRINGE FOR IV PUSH (FOR BLOOD PRESSURE SUPPORT)
PREFILLED_SYRINGE | INTRAVENOUS | Status: AC
  Filled 2022-07-11: qty 10

## 2022-07-11 MED ORDER — PROPOFOL 10 MG/ML IV BOLUS
INTRAVENOUS | Status: AC
  Filled 2022-07-11: qty 20

## 2022-07-11 MED ORDER — OXYCODONE HCL 5 MG PO TABS
ORAL_TABLET | ORAL | Status: AC
  Administered 2022-07-11: 5 mg via ORAL
  Filled 2022-07-11: qty 1

## 2022-07-11 MED ORDER — ROCURONIUM BROMIDE 10 MG/ML (PF) SYRINGE
PREFILLED_SYRINGE | INTRAVENOUS | Status: AC
  Filled 2022-07-11: qty 20

## 2022-07-11 MED ORDER — NITROGLYCERIN 0.4 MG SL SUBL: 0.4000 mg | SUBLINGUAL_TABLET | SUBLINGUAL | Status: DC | PRN

## 2022-07-11 MED ORDER — MENTHOL 3 MG MT LOZG: 1.0000 | LOZENGE | OROMUCOSAL | Status: DC | PRN

## 2022-07-11 MED ORDER — PANTOPRAZOLE SODIUM 40 MG IV SOLR
40.0000 mg | Freq: Every day | INTRAVENOUS | Status: DC
  Administered 2022-07-11: 40 mg via INTRAVENOUS
  Filled 2022-07-11: qty 10

## 2022-07-11 MED ORDER — ACETAMINOPHEN 500 MG PO TABS
1000.0000 mg | ORAL_TABLET | Freq: Four times a day (QID) | ORAL | Status: DC
  Administered 2022-07-11 – 2022-07-12 (×3): 1000 mg via ORAL
  Filled 2022-07-11 (×3): qty 2

## 2022-07-11 MED ORDER — ALBUMIN HUMAN 5 % IV SOLN: INTRAVENOUS | Status: DC | PRN

## 2022-07-11 MED ORDER — LIDOCAINE 2% (20 MG/ML) 5 ML SYRINGE
INTRAMUSCULAR | Status: AC
  Filled 2022-07-11: qty 5

## 2022-07-11 MED ORDER — BISACODYL 10 MG RE SUPP: 10.0000 mg | Freq: Every day | RECTAL | Status: DC | PRN

## 2022-07-11 MED ORDER — BACITRACIN ZINC 500 UNIT/GM EX OINT
TOPICAL_OINTMENT | CUTANEOUS | Status: AC
  Filled 2022-07-11: qty 28.35

## 2022-07-11 MED ORDER — POLYETHYLENE GLYCOL 3350 17 G PO PACK: 17.0000 g | PACK | Freq: Every day | ORAL | Status: DC | PRN

## 2022-07-11 MED ORDER — DOCUSATE SODIUM 100 MG PO CAPS
100.0000 mg | ORAL_CAPSULE | Freq: Two times a day (BID) | ORAL | Status: DC
  Administered 2022-07-11: 100 mg via ORAL
  Filled 2022-07-11: qty 1

## 2022-07-11 MED ORDER — ONDANSETRON HCL 4 MG PO TABS: 4.0000 mg | ORAL_TABLET | Freq: Four times a day (QID) | ORAL | Status: DC | PRN

## 2022-07-11 MED ORDER — FLUTICASONE PROPIONATE 50 MCG/ACT NA SUSP: 1.0000 | Freq: Every day | NASAL | Status: DC | PRN

## 2022-07-11 MED ORDER — EZETIMIBE 10 MG PO TABS
10.0000 mg | ORAL_TABLET | Freq: Every day | ORAL | Status: DC
  Administered 2022-07-11: 10 mg via ORAL
  Filled 2022-07-11: qty 1

## 2022-07-11 MED ORDER — FLUTICASONE FUROATE-VILANTEROL 200-25 MCG/ACT IN AEPB: 1.0000 | INHALATION_SPRAY | Freq: Every day | RESPIRATORY_TRACT | Status: DC

## 2022-07-11 MED ORDER — GABAPENTIN 600 MG PO TABS
600.0000 mg | ORAL_TABLET | Freq: Three times a day (TID) | ORAL | Status: DC
  Administered 2022-07-11 – 2022-07-12 (×2): 600 mg via ORAL
  Filled 2022-07-11 (×2): qty 1

## 2022-07-11 MED ORDER — DEXAMETHASONE 4 MG PO TABS: 4.0000 mg | ORAL_TABLET | Freq: Four times a day (QID) | ORAL | Status: AC

## 2022-07-11 MED ORDER — 0.9 % SODIUM CHLORIDE (POUR BTL) OPTIME
TOPICAL | Status: DC | PRN
  Administered 2022-07-11: 1000 mL

## 2022-07-11 MED ORDER — ROCURONIUM BROMIDE 10 MG/ML (PF) SYRINGE
PREFILLED_SYRINGE | INTRAVENOUS | Status: DC | PRN
  Administered 2022-07-11: 20 mg via INTRAVENOUS
  Administered 2022-07-11: 100 mg via INTRAVENOUS
  Administered 2022-07-11: 20 mg via INTRAVENOUS

## 2022-07-11 MED ORDER — IPRATROPIUM-ALBUTEROL 0.5-2.5 (3) MG/3ML IN SOLN: 3.0000 mL | Freq: Four times a day (QID) | RESPIRATORY_TRACT | Status: DC | PRN

## 2022-07-11 MED ORDER — EPHEDRINE SULFATE-NACL 50-0.9 MG/10ML-% IV SOSY: PREFILLED_SYRINGE | INTRAVENOUS | Status: DC | PRN

## 2022-07-11 MED ORDER — ACETAMINOPHEN 650 MG RE SUPP: 650.0000 mg | RECTAL | Status: DC | PRN

## 2022-07-11 MED ORDER — ALUM & MAG HYDROXIDE-SIMETH 200-200-20 MG/5ML PO SUSP: 30.0000 mL | Freq: Four times a day (QID) | ORAL | Status: DC | PRN

## 2022-07-11 MED ORDER — SUGAMMADEX SODIUM 200 MG/2ML IV SOLN
INTRAVENOUS | Status: DC | PRN
  Administered 2022-07-11: 400 mg via INTRAVENOUS

## 2022-07-11 MED ORDER — FENTANYL CITRATE (PF) 250 MCG/5ML IJ SOLN
INTRAMUSCULAR | Status: AC
  Filled 2022-07-11: qty 5

## 2022-07-11 MED ORDER — PROPOFOL 10 MG/ML IV BOLUS
INTRAVENOUS | Status: DC | PRN
  Administered 2022-07-11: 90 mg via INTRAVENOUS

## 2022-07-11 MED ORDER — OXYCODONE HCL 5 MG PO TABS: 5.0000 mg | ORAL_TABLET | ORAL | Status: DC | PRN

## 2022-07-11 MED ORDER — LOSARTAN POTASSIUM 50 MG PO TABS: 25.0000 mg | ORAL_TABLET | Freq: Every day | ORAL | Status: DC

## 2022-07-11 MED ORDER — FAMOTIDINE 20 MG PO TABS
20.0000 mg | ORAL_TABLET | Freq: Every day | ORAL | Status: DC
  Administered 2022-07-11: 20 mg via ORAL
  Filled 2022-07-11: qty 1

## 2022-07-11 MED ORDER — TAMSULOSIN HCL 0.4 MG PO CAPS
0.4000 mg | ORAL_CAPSULE | Freq: Every day | ORAL | Status: DC
  Administered 2022-07-11: 0.4 mg via ORAL
  Filled 2022-07-11: qty 1

## 2022-07-11 MED ORDER — OXYCODONE HCL 5 MG PO TABS: 10.0000 mg | ORAL_TABLET | ORAL | Status: DC | PRN

## 2022-07-11 MED ORDER — PHENYLEPHRINE 80 MCG/ML (10ML) SYRINGE FOR IV PUSH (FOR BLOOD PRESSURE SUPPORT)
PREFILLED_SYRINGE | INTRAVENOUS | Status: DC | PRN
  Administered 2022-07-11 (×3): 160 ug via INTRAVENOUS

## 2022-07-11 MED ORDER — DEXAMETHASONE SODIUM PHOSPHATE 4 MG/ML IJ SOLN
4.0000 mg | Freq: Four times a day (QID) | INTRAMUSCULAR | Status: AC
  Administered 2022-07-11 – 2022-07-12 (×2): 4 mg via INTRAVENOUS
  Filled 2022-07-11 (×2): qty 1

## 2022-07-11 MED ORDER — ISOSORBIDE MONONITRATE ER 30 MG PO TB24
15.0000 mg | ORAL_TABLET | Freq: Every day | ORAL | Status: DC
  Administered 2022-07-11: 15 mg via ORAL
  Filled 2022-07-11 (×2): qty 1

## 2022-07-11 MED ORDER — ORAL CARE MOUTH RINSE: 15.0000 mL | Freq: Once | OROMUCOSAL | Status: AC

## 2022-07-11 MED ORDER — LIDOCAINE 2% (20 MG/ML) 5 ML SYRINGE
INTRAMUSCULAR | Status: DC | PRN
  Administered 2022-07-11: 60 mg via INTRAVENOUS

## 2022-07-11 MED ORDER — CHLORHEXIDINE GLUCONATE 0.12 % MT SOLN
15.0000 mL | Freq: Once | OROMUCOSAL | Status: AC
  Administered 2022-07-11: 15 mL via OROMUCOSAL
  Filled 2022-07-11: qty 15

## 2022-07-11 MED ORDER — CEFAZOLIN SODIUM-DEXTROSE 2-3 GM-%(50ML) IV SOLR
INTRAVENOUS | Status: DC | PRN
  Administered 2022-07-11: 2 g via INTRAVENOUS

## 2022-07-11 SURGICAL SUPPLY — 59 items
APL SKNCLS STERI-STRIP NONHPOA (GAUZE/BANDAGES/DRESSINGS) ×1
BAG COUNTER SPONGE SURGICOUNT (BAG) ×2 IMPLANT
BAG SPNG CNTER NS LX DISP (BAG) ×2
BAND INSRT 18 STRL LF DISP RB (MISCELLANEOUS)
BAND RUBBER #18 3X1/16 STRL (MISCELLANEOUS) IMPLANT
BENZOIN TINCTURE PRP APPL 2/3 (GAUZE/BANDAGES/DRESSINGS) ×4 IMPLANT
BIT DRILL NEURO 2X3.1 SFT TUCH (MISCELLANEOUS) ×2 IMPLANT
BLADE SURG 15 STRL LF DISP TIS (BLADE) ×2 IMPLANT
BLADE SURG 15 STRL SS (BLADE) ×1
BLADE ULTRA TIP 2M (BLADE) ×2 IMPLANT
BUR BARREL STRAIGHT FLUTE 4.0 (BURR) ×2 IMPLANT
BUR MATCHSTICK NEURO 3.0 LAGG (BURR) ×2 IMPLANT
CANISTER SUCT 3000ML PPV (MISCELLANEOUS) ×2 IMPLANT
COVER MAYO STAND STRL (DRAPES) ×2 IMPLANT
DRAIN JACKSON PRATT 10MM FLAT (MISCELLANEOUS) IMPLANT
DRAPE LAPAROTOMY 100X72 PEDS (DRAPES) ×2 IMPLANT
DRAPE MICROSCOPE SLANT 54X150 (MISCELLANEOUS) IMPLANT
DRAPE SURG 17X23 STRL (DRAPES) ×4 IMPLANT
DRILL NEURO 2X3.1 SOFT TOUCH (MISCELLANEOUS) ×1
DRSG OPSITE POSTOP 3X4 (GAUZE/BANDAGES/DRESSINGS) ×2 IMPLANT
DRSG OPSITE POSTOP 4X6 (GAUZE/BANDAGES/DRESSINGS) IMPLANT
ELECT REM PT RETURN 9FT ADLT (ELECTROSURGICAL) ×1
ELECTRODE REM PT RTRN 9FT ADLT (ELECTROSURGICAL) ×2 IMPLANT
EVACUATOR SILICONE 100CC (DRAIN) IMPLANT
GAUZE 4X4 16PLY ~~LOC~~+RFID DBL (SPONGE) IMPLANT
GLOVE BIO SURGEON STRL SZ 6.5 (GLOVE) ×2 IMPLANT
GLOVE BIO SURGEON STRL SZ8 (GLOVE) ×2 IMPLANT
GLOVE BIO SURGEON STRL SZ8.5 (GLOVE) ×2 IMPLANT
GLOVE BIOGEL PI IND STRL 6.5 (GLOVE) ×2 IMPLANT
GLOVE EXAM NITRILE XL STR (GLOVE) IMPLANT
GOWN STRL REUS W/ TWL LRG LVL3 (GOWN DISPOSABLE) ×2 IMPLANT
GOWN STRL REUS W/ TWL XL LVL3 (GOWN DISPOSABLE) IMPLANT
GOWN STRL REUS W/TWL LRG LVL3 (GOWN DISPOSABLE) ×1
GOWN STRL REUS W/TWL XL LVL3 (GOWN DISPOSABLE) ×3
HEMOSTAT POWDER KIT SURGIFOAM (HEMOSTASIS) ×2 IMPLANT
KIT BASIN OR (CUSTOM PROCEDURE TRAY) ×2 IMPLANT
KIT TURNOVER KIT B (KITS) ×2 IMPLANT
MARKER SKIN DUAL TIP RULER LAB (MISCELLANEOUS) ×2 IMPLANT
NDL SPNL 18GX3.5 QUINCKE PK (NEEDLE) ×2 IMPLANT
NEEDLE HYPO 22GX1.5 SAFETY (NEEDLE) ×2 IMPLANT
NEEDLE SPNL 18GX3.5 QUINCKE PK (NEEDLE) ×1 IMPLANT
NS IRRIG 1000ML POUR BTL (IV SOLUTION) ×2 IMPLANT
PACK LAMINECTOMY NEURO (CUSTOM PROCEDURE TRAY) ×2 IMPLANT
PEEK VISTA 14X14X7MM (Peek) IMPLANT
PEEK VISTA 14X14X8MM (Peek) IMPLANT
PIN DISTRACTION 14MM (PIN) ×4 IMPLANT
PLATE ANT CERV XTEND 2 LV 32 (Plate) IMPLANT
PUTTY DBM 5CC CALC GRAN (Putty) IMPLANT
SCREW XTD VAR 4.2 SELF TAP (Screw) IMPLANT
SPIKE FLUID TRANSFER (MISCELLANEOUS) ×2 IMPLANT
SPONGE INTESTINAL PEANUT (DISPOSABLE) ×4 IMPLANT
SPONGE SURGIFOAM ABS GEL SZ50 (HEMOSTASIS) IMPLANT
STRIP CLOSURE SKIN 1/2X4 (GAUZE/BANDAGES/DRESSINGS) ×2 IMPLANT
SUT VIC AB 0 CT1 27 (SUTURE) ×1
SUT VIC AB 0 CT1 27XBRD ANTBC (SUTURE) ×2 IMPLANT
SUT VIC AB 3-0 SH 8-18 (SUTURE) ×2 IMPLANT
TOWEL GREEN STERILE (TOWEL DISPOSABLE) ×2 IMPLANT
TOWEL GREEN STERILE FF (TOWEL DISPOSABLE) ×2 IMPLANT
WATER STERILE IRR 1000ML POUR (IV SOLUTION) ×2 IMPLANT

## 2022-07-11 NOTE — Anesthesia Postprocedure Evaluation (Signed)
Anesthesia Post Note  Patient: LENORD FRALIX  Procedure(s) Performed: Anterior Cervical Decompression Fusion ,Interboy Prothesis,Plate/Screws , Cervical four-five,Cervical five-six     Patient location during evaluation: PACU Anesthesia Type: General Level of consciousness: awake and alert Pain management: pain level controlled Vital Signs Assessment: post-procedure vital signs reviewed and stable Respiratory status: spontaneous breathing, nonlabored ventilation, respiratory function stable and patient connected to nasal cannula oxygen Cardiovascular status: blood pressure returned to baseline and stable Postop Assessment: no apparent nausea or vomiting Anesthetic complications: no   No notable events documented.  Last Vitals:  Vitals:   07/11/22 1545 07/11/22 1600  BP: 135/67 (!) 139/58  Pulse: 83 77  Resp: 18 12  Temp: 36.9 C   SpO2: 93% 94%    Last Pain:  Vitals:   07/11/22 1615  TempSrc:   PainSc: [place]

## 2022-07-11 NOTE — Op Note (Signed)
Brief history: The patient is an 81 year old white male on whom I performed a C3-4 anterior cervicectomy fusion plating many years ago.  He has developed recurrent neck and right arm pain consistent with a cervical radiculopathy.  He failed medical management.  He was worked up with a cervical MRI which demonstrated spondylosis and foraminal stenosis most prominent at C4-5 and C5-6.  I discussed the various treatment options with him.  He has decided proceed with surgery.  Preoperative diagnosis: Cervical spondylosis, cervicalgia, cervical radiculopathy, cervical neuroforaminal stenosis  Postoperative diagnosis: The same  Procedure: C4-5 and C5-6 anterior cervical discectomy/decompression; C4-5 and C5-6 interbody arthrodesis with local morcellized autograft bone and Zimmer DBM; insertion of interbody prosthesis at C4-5 and C5-6 (Zimmer peek interbody prosthesis); anterior cervical plating from C4-C6 with globus titanium plate  Surgeon: Dr. Earle Gell  Asst.: Arnetha Massy, NP  Anesthesia: Gen. endotracheal  Estimated blood loss: 100 cc  Drains: 10 mm flat Jackson-Pratt drain in the prevertebral space  Complications: None  Description of procedure: The patient was brought to the operating room by the anesthesia team. General endotracheal anesthesia was induced. A roll was placed under the patient's shoulders to keep the neck in the neutral position. The patient's anterior cervical region was then prepared with Betadine scrub and Betadine solution. Sterile drapes were applied.  The area to be incised was then injected with Marcaine with epinephrine solution. I then used a scalpel to make a transverse incision in the patient's left anterior neck. I used the Metzenbaum scissors to dissect through the scar tissue and to divide the platysmal muscle and then to dissect medial to the sternocleidomastoid muscle, jugular vein, and carotid artery. I carefully dissected down towards the anterior cervical  spine identifying the esophagus and retracting it medially. Then using Kitner swabs to clear soft tissue from the anterior cervical spine. We then inserted a bent spinal needle into the upper exposed intervertebral disc space. We then obtained intraoperative radiographs confirm our location.  I then used electrocautery to detach the medial border of the longus colli muscle bilaterally from the C4-5 and C5-6 intervertebral disc spaces. I then inserted the Caspar self-retaining retractor underneath the longus colli muscle bilaterally to provide exposure.  We then incised the intervertebral disc at C4-5. We then performed a partial intervertebral discectomy with a pituitary forceps and the Karlin curettes. I then inserted distraction screws into the vertebral bodies at C4-5. We then distracted the interspace. We then used the high-speed drill to decorticate the vertebral endplates at X7-3, to drill away the remainder of the intervertebral disc, to drill away some posterior spondylosis, and to thin out the posterior longitudinal ligament. I then incised ligament with the arachnoid knife. We then removed the ligament with a Kerrison punches undercutting the vertebral endplates and decompressing the thecal sac. We then performed foraminotomies about the bilateral C5 nerve roots. This completed the decompression at this level.  We then repeat his procedure in analogous fashion at C5-6 decompressing the thecal sac and the bilateral C6 nerve root.  We now turned our to attention to the interbody fusion. We used the trial spacers to determine the appropriate size for the interbody prosthesis. We then pre-filled prosthesis with a combination of local morcellized autograft bone that we obtained during decompression as well as Zimmer DBM. We then inserted the prosthesis into the distracted interspace at C4-5 and C5-6. We then removed the distraction screws. There was a good snug fit of the prosthesis in the  interspace.  Having  completed the fusion we now turned attention to the anterior spinal instrumentation. We used the high-speed drill to drill away some anterior spondylosis at the disc spaces so that the plate lay down flat. We selected the appropriate length titanium anterior cervical plate. We laid it along the anterior aspect of the vertebral bodies from C4-C6. We then drilled 14 mm holes at C4, C5 and C6. We then secured the plate to the vertebral bodies by placing two 14 mm self-tapping screws at C4, C5 and C6. We then obtained intraoperative radiograph. The demonstrating good position of the instrumentation. We therefore secured the screws the plate the locking each cam. This completed the instrumentation.  We then obtained hemostasis using bipolar electrocautery. We irrigated the wound out with saline solution. We then removed the retractor. We inspected the esophagus for any damage. There was none apparent.  We placed a 10 mm flat Jackson-Pratt drain in the prevertebral space and tunneled it out through a separate stab wound.  We then reapproximated patient's platysmal muscle with interrupted 3-0 Vicryl suture. We then reapproximated the subcutaneous tissue with interrupted 3-0 Vicryl suture. The skin was reapproximated with Steri-Strips and benzoin. The wound was then covered with bacitracin ointment. A sterile dressing was applied. The drapes were removed. Patient was subsequently extubated by the anesthesia team and transported to the post anesthesia care unit in stable condition. All sponge instrument and needle counts were reportedly correct at the end of this case.

## 2022-07-11 NOTE — Anesthesia Preprocedure Evaluation (Signed)
Anesthesia Evaluation  Patient identified by MRN, date of birth, ID band Patient awake    Reviewed: Allergy & Precautions, H&P , NPO status , Patient's Chart, lab work & pertinent test results  Airway Mallampati: II   Neck ROM: full    Dental   Pulmonary sleep apnea , COPD, former smoker   breath sounds clear to auscultation       Cardiovascular hypertension, + CAD, + Past MI, + Cardiac Stents, + CABG and +CHF   Rhythm:regular Rate:Normal     Neuro/Psych  Headaches PSYCHIATRIC DISORDERS Anxiety      Neuromuscular disease    GI/Hepatic hiatal hernia,GERD  ,,  Endo/Other  diabetes, Type 2    Renal/GU Renal InsufficiencyRenal disease     Musculoskeletal  (+) Arthritis ,    Abdominal   Peds  Hematology   Anesthesia Other Findings   Reproductive/Obstetrics                             Anesthesia Physical Anesthesia Plan  ASA: 3  Anesthesia Plan: General   Post-op Pain Management:    Induction: Intravenous  PONV Risk Score and Plan: 2 and Ondansetron, Dexamethasone and Treatment may vary due to age or medical condition  Airway Management Planned: Oral ETT and Video Laryngoscope Planned  Additional Equipment:   Intra-op Plan:   Post-operative Plan: Extubation in OR  Informed Consent: I have reviewed the patients History and Physical, chart, labs and discussed the procedure including the risks, benefits and alternatives for the proposed anesthesia with the patient or authorized representative who has indicated his/her understanding and acceptance.     Dental advisory given  Plan Discussed with: CRNA, Anesthesiologist and Surgeon  Anesthesia Plan Comments:        Anesthesia Quick Evaluation

## 2022-07-11 NOTE — Progress Notes (Signed)
Orthopedic Tech Progress Note Patient Details:  [name] January 28, 1941 [id number] Dropped off aspen lumbar at PACU Patient ID: MICAEL BARB, male   DOB: 02-Dec-1940, 81 y.o.   MRN: [id number]  Chip Boer 07/11/2022, 5:02 PM

## 2022-07-11 NOTE — H&P (Signed)
Subjective: The patient is a 81 year old white male with a history of bladder cancer who has complained of neck and right greater left shoulder and arm pain consistent with a cervical radiculopathy.  He has failed medical management and was worked up with a cervical MRI which demonstrated spondylosis and neuroforaminal stenosis most prominent at C4-5 and C5-6.  I discussed the various treatment options with him.  He has decided proceed with surgery.  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 06/2019 Echo (in setting of COVID): EF 35-40%, mild LVH, g1 DD, glob HK; b. 09/2019 Echo: EF 50-55%, Gr1 DD; c. 03/2021 Echo: EF 50-55%, no rwma, mild LVH, GrI DD, nl RV fxn. RVSP 44.39mHg. Mild-mod dil LA. Triv MR. Mild-mod Ao sclerosis.   AAA (abdominal aortic aneurysm) (HCC)    Actinic keratosis    Anginal pain ([facility]    BENIGN PROSTATIC HYPERTROPHY, WITH URINARY OBSTRUCTION 09/06/2007   CAD s/p CABG    a. 1990 s/p MI-->CABG x 2; b. 2002 s/p BMS to LCX; c. 06/2015 Cath: LM 50ost, LAD 100ost/p, 645m, D2 nl, LCX  patent stent, RCA 80p (small), LIMA->LAD nl, VG->D2 nl-->Med Rx; d. 05/2020 MV: fixed apical ant/apical defect. No signif ischemia; e. 04/2021 MV: EF 52%, no ischemia.   Cancer ([facility]   bladder   Carotid arterial disease ([facility]   a. 08/2016 Carotid U/S: <39% bilat.   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 07/2017 LE duplex: RSFA 75-9939mSFA 75-53m62m-74d; c. 08/2018 Periph Angio: No signif AoIliac dzs. Mod-sev Ca2+ RSFA w/ diff dzs throughout- 3 vessel runoff. Borderline signif LSFA dzs w/ mod-sev Ca2+ vessels and 3 vessel runoff below the knee-->Med rx.   Pneumonia 07/2014   [facility] hospitalization   Pneumonia due to COVID-19 virus 06/02/2019   Pre-diabetes    Prostatitis, chronic    PSA, INCREASED 07/09/2008   Spinal stenosis of lumbar region     Past Surgical History:  Procedure Laterality Date   ABDOMINAL AORTOGRAM W/LOWER EXTREMITY [name] 09/12/2018   Procedure: ABDOMINAL AORTOGRAM W/LOWER EXTREMITY;  Surgeon: AridWellington Hampshire;  [facility];  Service: Cardiovascular;  Laterality: [name];   APPENDECTOMY     rupture   BACK SURGERY     1984 and then another one [facility]   BLADDER INSTILLATION [name] 10/19/2021   Procedure: BLADDER INSTILLATION OF GEMCITABINE;  Surgeon: StoiAbbie Sons;  Location: [facility];  Service: Urology;  Laterality: [name];   CARDIAC CATHETERIZATION  [name] 07/07/2015   Procedure: Left Heart Cath and Cors/Grafts Angiography;  Surgeon: [name], MD;  Location: [facility];  Service: Cardiovascular;  Laterality: [name];   CATARACT EXTRACTION, BILATERAL  2009   CERVICAL SPINE SURGERY  12/2016   cervical stenosis [name])   COLONOSCOPY  03/2010   HP polyp, diverticulosis, rpt 10 yrs ([name])   CORONARY ANGIOPLASTY  11/30/2000   LCX   CORONARY ARTERY BYPASS GRAFT  1990   3 vessel    CYSTOSCOPY  12/23/2010   [facility]   EYE SURGERY     cataract   JOINT REPLACEMENT     KNEE ARTHROSCOPY Right    x2   LAMINOTOMY  1986   L5/S1 lumbar laminotomy for two ruptured discs/fusion    LUMBAR LAMINECTOMY/DECOMPRESSION MICRODISCECTOMY [name] 07/13/2016   Procedure: LUMBAR TWO-THREE, LUMBAR THREE-FOUR, LUMBAR FOUR-FIVE LAMINECTOMY AND FORAMINOTOMY;  Surgeon: [name], MD;  Location: [place];  Service: Neurosurgery;  Laterality: [name];  LAMINECTOMY AND FORAMINOTOMY L2-L3, L3-L4,L4-L5   LUMBAR SPINE SURGERY  06/2020   Bilateral redo laminectomy/laminotomy/foraminotomies/medial facetectomy to decompress the bilateral L4 and L5 nerve roots [name])   TOTAL HIP ARTHROPLASTY Bilateral [id number]   TOTAL KNEE ARTHROPLASTY Right 03/06/2017   Procedure: RIGHT TOTAL KNEE ARTHROPLASTY;  Surgeon: Gaynelle Arabian, MD;  Location: [facility];  Service: Orthopedics;  Laterality: Right;   TRANSURETHRAL RESECTION OF BLADDER TUMOR [name] 10/19/2021   Procedure: TRANSURETHRAL RESECTION OF BLADDER TUMOR (TURBT);  Surgeon: Abbie Sons, MD;  Location: [facility];  Service: Urology;  Laterality: [name];    Allergies  Allergen Reactions   Vioxx [Rofecoxib] Other (See Comments)    Hemorrhage    Spiriva Respimat [Tiotropium Bromide Monohydrate] Other (See Comments)    Elevated bp   Contrast Media [Iodinated Contrast Media] Itching and Rash    Delayed reaction post abdominal aortagram.    Morphine Nausea Only and Other (See Comments)    Irritability     Social History   Tobacco Use   Smoking status: Former    Packs/day: 1.00    Years: 25.00    Total pack years: 25.00    Types: Cigarettes    Quit date: 08/15/1988    Years since quitting: 33.9    Passive exposure: Past   Smokeless tobacco: Never  Substance Use Topics   Alcohol use: No    Alcohol/week: 0.0 standard drinks of alcohol    Family History  Problem Relation Age of Onset   Stroke Mother    Heart attack Mother    Diabetes Mother    Stroke Father    Heart disease Father    Polycystic kidney disease Father    Cancer Sister        throat   Polycystic kidney disease Sister    Diabetes Brother        1/2 brother   Cancer Other         5/7 nephews with lung cancer   Prior to Admission medications   Medication Sig Start Date End Date Taking? Authorizing Provider  acetaminophen (TYLENOL) 500 MG tablet Take 1,000 mg by mouth every 8 (eight) hours as needed for moderate pain.   Yes [provider]  atorvastatin (LIPITOR) 20 MG tablet TAKE 1 TABLET BY MOUTH DAILY. Patient taking differently: at bedtime. 01/17/22  Yes Gollan, Kathlene November, MD  Azelastine HCl (ASTEPRO) 0.15 % SOLN Place 1 spray into the nose at bedtime as needed (Sinus congestion, allergies). 02/03/22  Yes Chesley Mires, MD  docusate sodium (COLACE)  100 MG capsule Take 100 mg by mouth 2 (two) times daily as needed for mild constipation.   Yes [provider]  ezetimibe (ZETIA) 10 MG tablet TAKE 1 TABLET BY MOUTH ONCE DAILY. Patient taking differently: Take 10 mg by mouth daily. 04/15/22  Yes [name], MD  famotidine (PEPCID) 20 MG tablet Take 20 mg by mouth daily.   Yes [provider]  fluticasone (FLONASE) 50 MCG/ACT nasal spray Place 1 spray into both nostrils daily as needed for allergies or rhinitis.   Yes [provider]  Fluticasone-Umeclidin-Vilant (TRELEGY ELLIPTA) 200-62.5-25 MCG/ACT AEPB Inhale 1 puff into the lungs daily. 06/15/22  Yes Tyler Pita, MD  furosemide (LASIX) 20 MG tablet Take 1 tablet by mouth every third day as directed. 02/14/22  Yes Gollan, Kathlene November, MD  gabapentin (NEURONTIN) 600 MG tablet Take 600 mg by mouth 3 (three) times daily.   Yes [provider]  ipratropium-albuterol (DUONEB) 0.5-2.5 (3) MG/3ML SOLN Take 3 mLs by nebulization every 6 (six) hours as needed. 07/06/21  Yes Tyler Pita, MD  isosorbide mononitrate (IMDUR) 30 MG 24 hr tablet Take 0.5 tablets (15 mg total) by mouth daily. Patient taking differently: Take 15 mg by mouth at bedtime. 01/13/22  Yes Furth, Cadence H, PA-C  losartan (COZAAR) 25 MG tablet TAKE 1 TABLET BY MOUTH DAILY Patient taking differently: Take 25 mg  by mouth daily. 04/29/22  Yes Gollan, Kathlene November, MD  nitroGLYCERIN (NITROSTAT) 0.4 MG SL tablet Place 1 tablet (0.4 mg total) under the tongue every 5 (five) minutes as needed for chest pain. 05/01/21  Yes Ria Bush, MD  polyethylene glycol (MIRALAX / GLYCOLAX) 17 g packet Take 17 g by mouth daily as needed for mild constipation.    Yes [provider]  PROAIR HFA 108 (90 Base) MCG/ACT inhaler TAKE 2 PUFFS INTO LUNGS EVERY 6 HOURS ASNEEDED FOR WHEEZING OR SHORTNESS OF BREATH Patient taking differently: Inhale 2 puffs into the lungs every 6 (six) hours as needed for wheezing or shortness of breath. 02/19/21  Yes Ria Bush, MD  ranolazine (RANEXA) 500 MG 12 hr tablet TAKE ONE (1) TABLET BY MOUTH TWO TIMES PER DAY Patient taking differently: Take 500 mg by mouth 2 (two) times daily. 04/19/22  Yes Gollan, Kathlene November, MD  tamsulosin (FLOMAX) 0.4 MG CAPS capsule TAKE 1 CAPSULE BY MOUTH AT BEDTIME 03/15/22  Yes Stoioff, Ronda Fairly, MD  amoxicillin-clavulanate (AUGMENTIN) 875-125 MG tablet Take 1 tablet by mouth 2 (two) times daily. Patient not taking: Reported on 07/11/2022 06/20/22   Leone Haven, MD  aspirin EC 81 MG EC tablet Take 1 tablet (81 mg total) by mouth daily. 06/26/19   Elgergawy, Silver Huguenin, MD     Review of Systems  Positive ROS: As above  All other systems have been reviewed and were otherwise negative with the exception of those mentioned in the HPI and as above.  Objective: Vital signs in last 24 hours: Temp:  [97.9 F (36.6 C)] 97.9 F (36.6 C) (11/27 1039) Pulse Rate:  [77] 77 (11/27 1039) Resp:  [18] 18 (11/27 1039) BP: (148)/(72) 148/72 (11/27 1039) SpO2:  [95 %] 95 % (11/27 1039) Estimated body mass index is 29.45 kg/m as calculated from the following:   Height as of this encounter: 6' (1.829 m).   Weight as of this encounter: 98.5 kg.   General Appearance: Alert Head: Normocephalic, without obvious abnormality, atraumatic Eyes: PERRL,  conjunctiva/corneas clear, EOM's intact,  Ears: Normal  Throat: Normal  Neck: Limited range of motion.  Positive Spurling's test on the right. Back: His lumbar incision is well-healed. Lungs: Clear to auscultation bilaterally, respirations unlabored Heart: Regular rate and rhythm, no murmur, rub or gallop Abdomen: Soft, non-tender Extremities: Extremities normal, atraumatic, no cyanosis or edema Skin: unremarkable  NEUROLOGIC:   Mental status: alert and oriented,Motor Exam - grossly normal Sensory Exam - grossly normal Reflexes:  Coordination - grossly normal Gait - grossly normal Balance - grossly normal Cranial Nerves: I: smell Not tested  II: visual acuity  OS: Normal  OD: Normal   II: visual fields Full to confrontation  II: pupils Equal, round, reactive to light  III,VII: ptosis None  III,IV,VI: extraocular muscles  Full ROM  V: mastication Normal  V: facial light touch sensation  Normal  V,VII: corneal reflex  Present  VII: facial muscle function - upper  Normal  VII: facial muscle function - lower Normal  VIII: hearing Not tested  IX: soft palate elevation  Normal  IX,X: gag reflex Present  XI: trapezius strength  5/5  XI: sternocleidomastoid strength 5/5  XI: neck flexion strength  5/5  XII: tongue strength  Normal    Data Review Lab Results  Component Value Date   WBC 8.7 06/14/2022   HGB 12.9 (L) 06/14/2022   HCT 38.7 (L) 06/14/2022   MCV 98.7 06/14/2022   PLT 199 06/14/2022   Lab Results  Component Value Date   NA 140 06/14/2022   K 3.9 06/14/2022   CL 104 06/14/2022   CO2 26 06/14/2022   BUN 34 (H) 06/14/2022   CREATININE 1.48 (H) 06/14/2022   GLUCOSE 121 (H) 06/14/2022   Lab Results  Component Value Date   INR 1.2 04/20/2021    Assessment/Plan: Cervical spondylosis, cervicalgia, cervical radiculopathy: I have discussed the situation with the patient.  I reviewed his imaging studies with him and pointed out the abnormalities.  We have  discussed the various treatment options including surgery.  I have described the surgical treatment option of a C4-5 and C5-6 anterior cervicectomy fusion and plating.  I have shown him surgical models.  I have given him a surgical pamphlet.  We have discussed the risk, benefits, alternatives, expected postoperative course, and likelihood of achieving her goals with surgery.  I have answered all his questions.  He has decided proceed with surgery.   Ophelia Charter 07/11/2022 11:37 AM

## 2022-07-11 NOTE — Transfer of Care (Signed)
Immediate Anesthesia Transfer of Care Note  Patient: [name]  Procedure(s) Performed: Anterior Cervical Decompression Fusion ,Interboy Prothesis,Plate/Screws , Cervical four-five,Cervical five-six  Patient Location: PACU  Anesthesia Type:General  Level of Consciousness: drowsy, patient cooperative, and responds to stimulation  Airway & Oxygen Therapy: Patient Spontanous Breathing  Post-op Assessment: Report given to RN and Post -op Vital signs reviewed and stable  Post vital signs: Reviewed and stable  Last Vitals:  Vitals Value Taken Time  BP 132/58 07/11/22 1540  Temp    Pulse 83 07/11/22 1544  Resp 15 07/11/22 1544  SpO2 91 % 07/11/22 1544  Vitals shown include unvalidated device data.  Last Pain:  Vitals:   07/11/22 1039  TempSrc: Oral  PainSc: 5          Complications: No notable events documented.

## 2022-07-11 NOTE — Anesthesia Procedure Notes (Signed)
Procedure Name: Intubation Date/Time: 07/11/2022 12:47 PM  Performed by: Amadeo Garnet, CRNAPre-anesthesia Checklist: Patient identified, Emergency Drugs available, Suction available and Patient being monitored Patient Re-evaluated:Patient Re-evaluated prior to induction Oxygen Delivery Method: Circle system utilized Preoxygenation: Pre-oxygenation with 100% oxygen Induction Type: IV induction Ventilation: Mask ventilation without difficulty Laryngoscope Size: Glidescope and 3 Grade View: Grade I Tube type: Oral Tube size: 7.5 mm Number of attempts: 1 Airway Equipment and Method: Stylet and Oral airway Placement Confirmation: ETT inserted through vocal cords under direct vision, positive ETCO2 and breath sounds checked- equal and bilateral Secured at: 22 cm Tube secured with: Tape Dental Injury: Teeth and Oropharynx as per pre-operative assessment

## 2022-07-11 NOTE — Progress Notes (Signed)
Subjective: The patient is alert and pleasant.  He looks well.  Objective: Vital signs in last 24 hours: Temp:  [97.9 F (36.6 C)] 97.9 F (36.6 C) (11/27 1039) Pulse Rate:  [77] 77 (11/27 1039) Resp:  [18] 18 (11/27 1039) BP: (148)/(72) 148/72 (11/27 1039) SpO2:  [95 %] 95 % (11/27 1039) Estimated body mass index is 29.45 kg/m as calculated from the following:   Height as of this encounter: 6' (1.829 m).   Weight as of this encounter: 98.5 kg.   Intake/Output from previous day: No intake/output data recorded. Intake/Output this shift: Total I/O In: 1900 [I.V.:1600; IV Piggyback:300] Out: 200 [Blood:200]  Physical exam the patient is alert and pleasant.  His strength is grossly normal.  His dressing is clean and dry.  There is no hematoma or shift.  Lab Results: No results for input(s): "WBC", "HGB", "HCT", "PLT" in the last 72 hours. BMET No results for input(s): "NA", "K", "CL", "CO2", "GLUCOSE", "BUN", "CREATININE", "CALCIUM" in the last 72 hours.  Studies/Results: No results found.  Assessment/Plan: The patient is doing well.  LOS: 0 days     Ophelia Charter 07/11/2022, 3:49 PM

## 2022-07-12 ENCOUNTER — Other Ambulatory Visit: Payer: Self-pay

## 2022-07-12 ENCOUNTER — Encounter (HOSPITAL_COMMUNITY): Payer: Self-pay | Admitting: Neurosurgery

## 2022-07-12 DIAGNOSIS — M4722 Other spondylosis with radiculopathy, cervical region: Secondary | ICD-10-CM | POA: Diagnosis not present

## 2022-07-12 MED ORDER — HYDROCODONE-ACETAMINOPHEN 5-325 MG PO TABS: 1.0000 | ORAL_TABLET | ORAL | Status: DC | PRN

## 2022-07-12 MED ORDER — CYCLOBENZAPRINE HCL 5 MG PO TABS
5.0000 mg | ORAL_TABLET | Freq: Three times a day (TID) | ORAL | Status: DC | PRN
  Administered 2022-07-12: 5 mg via ORAL
  Filled 2022-07-12: qty 1

## 2022-07-12 MED ORDER — CYCLOBENZAPRINE HCL 5 MG PO TABS: 5.0000 mg | ORAL_TABLET | Freq: Three times a day (TID) | ORAL | 0 refills | Status: DC | PRN

## 2022-07-12 MED ORDER — HYDROCODONE-ACETAMINOPHEN 5-325 MG PO TABS: 1.0000 | ORAL_TABLET | ORAL | 0 refills | Status: DC | PRN

## 2022-07-12 MED ORDER — DOCUSATE SODIUM 100 MG PO CAPS: 100.0000 mg | ORAL_CAPSULE | Freq: Two times a day (BID) | ORAL | 0 refills | Status: DC

## 2022-07-12 MED FILL — Thrombin For Soln 5000 Unit: CUTANEOUS | Qty: 5000 | Status: AC

## 2022-07-12 NOTE — Evaluation (Signed)
Occupational Therapy Evaluation and Discharge Patient Details Name: [name] MRN: [id number] DOB: 08-21-1940 Today's Date: 07/12/2022   History of Present Illness Pt is an 81 y/o male admitted 07/11/22 for planned C4-5 and C5-6 anterior cervical discectomy fusion and plating.  PMH including HFimpEF, AAA, CAD s/p CABG, Cancer 2023, CHF, CKD (stage III), HTN, OSA, Osteoarthritis, PAD and Spinal stenosis of lumbar region.   Clinical Impression   Pt is typically independent in ADL and mobility, still drives. Today he is overall supervision to mod I for all aspects of ADL and mobility. PT provided with cervical body mechanics handout and reviewed in full with special emphasis on ADL and safety/fall prevention. Pt able to demonstrate UB and LB ADL, transfers, don/doff brace, and ambulate in room without LOB. Education complete, PT verbalized understanding and OT will sign off at this time.       Recommendations for follow up therapy are one component of a multi-disciplinary discharge planning process, led by the attending physician.  Recommendations may be updated based on patient status, additional functional criteria and insurance authorization.   Follow Up Recommendations  No OT follow up     Assistance Recommended at Discharge PRN  Patient can return home with the following Assist for transportation    Functional Status Assessment  Patient has had a recent decline in their functional status and demonstrates the ability to make significant improvements in function in a reasonable and predictable amount of time.  Equipment Recommendations  None recommended by OT    Recommendations for Other Services       Precautions / Restrictions Precautions Precautions: Cervical Precaution Booklet Issued: Yes (comment) Precaution Comments: brace ok to come off for: sleeping, bathroom visits, showering Required Braces or Orthoses: Cervical Brace Cervical Brace: For  comfort Restrictions Weight Bearing Restrictions: No      Mobility Bed Mobility Overal bed mobility: Needs Assistance Bed Mobility: Supine to Sit     Supine to sit: Supervision, HOB elevated          Transfers Overall transfer level: Modified independent Equipment used: None                      Balance Overall balance assessment: No apparent balance deficits (not formally assessed)                                         ADL either performed or assessed with clinical judgement   ADL Overall ADL's : Modified independent                                       General ADL Comments: able to dress lower body, perform tranfers, demonstrate don/doff of brace, has appropriate AE to assist as needed     Vision Patient Visual Report: No change from baseline       Perception     Praxis      Pertinent Vitals/Pain Pain Assessment Pain Assessment: Faces Faces Pain Scale: Hurts a little bit Pain Location: surgical site Pain Descriptors / Indicators: Sore, Operative site guarding Pain Intervention(s): Monitored during session, Repositioned, RN gave pain meds during session     Hand Dominance Right   Extremity/Trunk Assessment Upper Extremity Assessment Upper Extremity Assessment: Overall WFL for tasks assessed (no pain in RUE)  Lower Extremity Assessment Lower Extremity Assessment: Overall WFL for tasks assessed   Cervical / Trunk Assessment Cervical / Trunk Assessment: Neck Surgery   Communication Communication Communication: No difficulties   Cognition Arousal/Alertness: Awake/alert Behavior During Therapy: WFL for tasks assessed/performed Overall Cognitive Status: Within Functional Limits for tasks assessed                                       General Comments       Exercises     Shoulder Instructions      Home Living Family/patient expects to be discharged to:: Private residence Living  Arrangements: Spouse/significant other;Children Available Help at Discharge: Family;Available 24 hours/day Type of Home: House Home Access: Stairs to enter [employer] of Steps: 3 Entrance Stairs-Rails: Right;Left;Can reach both Home Layout: One level     Bathroom Shower/Tub: Occupational psychologist: Handicapped height Bathroom Accessibility: Yes How Accessible: Accessible via walker Home Equipment: Firth (2 wheels);Rollator (4 wheels);Hand held shower head;Adaptive equipment;Electric scooter Adaptive Equipment: Reacher;Sock aid;Long-handled shoe horn        Prior Functioning/Environment Prior Level of Function : Independent/Modified Independent             Mobility Comments: typically does not use DME for mobility ADLs Comments: typically mod I        OT Problem List: Decreased knowledge of precautions;Pain      OT Treatment/Interventions:      OT Goals(Current goals can be found in the care plan section) Acute Rehab OT Goals Patient Stated Goal: get back to working in his wood shop OT Goal Formulation: With patient Time For Goal Achievement: 07/26/22 Potential to Achieve Goals: Good  OT Frequency:      Co-evaluation              AM-PAC OT "6 Clicks" Daily Activity     Outcome Measure Help from another person eating meals?: None Help from another person taking care of personal grooming?: None Help from another person toileting, which includes using toliet, bedpan, or urinal?: None Help from another person bathing (including washing, rinsing, drying)?: None Help from another person to put on and taking off regular upper body clothing?: None Help from another person to put on and taking off regular lower body clothing?: None 6 Click Score: 24   End of Session Equipment Utilized During Treatment:  (None) Nurse Communication: Mobility status  Activity Tolerance: Patient tolerated treatment well Patient left: in chair;with  call bell/phone within reach;with nursing/sitter in room  OT Visit Diagnosis: Pain Pain - Right/Left: Right (central) Pain - part of body: Shoulder (cervical spine - sx site)                Time: 6387-5643 OT Time Calculation (min): 22 min Charges:  OT General Charges $OT Visit: 1 Visit OT Evaluation $OT Eval Low Complexity: [place] OTR/L Acute Rehabilitation Services Office: [place] 07/12/2022, 8:50 AM

## 2022-07-12 NOTE — Plan of Care (Signed)
Patient alert and oriented, void, VSS. Surgical site clean and dry. D/c instructions explain and copy given to the patient all questions answered. D/c patient home per order.

## 2022-07-12 NOTE — Discharge Summary (Signed)
Physician Discharge Summary  Patient ID: [name] MRN: [id number] DOB/AGE: 81/06/1941 81 y.o.  Admit date: 07/11/2022 Discharge date: 07/12/2022  Admission Diagnoses: Cervical spondylosis, cervical radiculopathy, cervicalgia, cervical neuroforaminal stenosis  Discharge Diagnoses: The same Principal Problem:   Cervical spondylosis with radiculopathy   Discharged Condition: good  Hospital Course: I performed a C4-5 and C5-6 anterior cervical discectomy fusion and plating on the patient on 07/11/2022.  The surgery went well.  The patient's postoperative course was unremarkable.  On postoperative day #1 he requested discharge home.  The patient was given verbal and written discharge instructions.  All his questions were answered.  Consults: OT, care management Significant Diagnostic Studies: None Treatments: C4-5 and C5-6 anterior cervical discectomy fusion and plating. Discharge Exam: Blood pressure (!) 118/53, pulse 62, temperature 97.9 F (36.6 C), temperature source Oral, resp. rate 18, height 6' (1.829 m), weight 98.5 kg, SpO2 99 %. The patient is alert and pleasant.  His strength is normal.  His dressing is clean and dry.  There is no hematoma or shift.  Disposition: Home  Discharge Instructions     Call MD for:  difficulty breathing, headache or visual disturbances   Complete by: As directed    Call MD for:  extreme fatigue   Complete by: As directed    Call MD for:  hives   Complete by: As directed    Call MD for:  persistant dizziness or light-headedness   Complete by: As directed    Call MD for:  persistant nausea and vomiting   Complete by: As directed    Call MD for:  redness, tenderness, or signs of infection (pain, swelling, redness, odor or green/yellow discharge around incision site)   Complete by: As directed    Call MD for:  severe uncontrolled pain   Complete by: As directed    Call MD for:  temperature >100.4   Complete by: As directed    Diet  - low sodium heart healthy   Complete by: As directed    Discharge instructions   Complete by: As directed    Call [phone] for a followup appointment. Take a stool softener while you are using pain medications.   Driving Restrictions   Complete by: As directed    Do not drive for 2 weeks.   Increase activity slowly   Complete by: As directed    Lifting restrictions   Complete by: As directed    Do not lift more than 5 pounds. No excessive bending or twisting.   May shower / Bathe   Complete by: As directed    Remove the dressing for 3 days after surgery.  You may shower, but leave the incision alone.   Remove dressing in 48 hours   Complete by: As directed       Allergies as of 07/12/2022       Reactions   Vioxx [rofecoxib] Other (See Comments)   Hemorrhage    Spiriva Respimat [tiotropium Bromide Monohydrate] Other (See Comments)   Elevated bp   Contrast Media [iodinated Contrast Media] Itching, Rash   Delayed reaction post abdominal aortagram.    Morphine Nausea Only, Other (See Comments)   Irritability        Medication List     STOP taking these medications    acetaminophen 500 MG tablet Commonly known as: TYLENOL   amoxicillin-clavulanate 875-125 MG tablet Commonly known as: AUGMENTIN       TAKE these medications    aspirin EC 81  MG tablet Take 1 tablet (81 mg total) by mouth daily.   atorvastatin 20 MG tablet Commonly known as: LIPITOR TAKE 1 TABLET BY MOUTH DAILY. What changed:  how much to take how to take this when to take this   Azelastine HCl 0.15 % Soln Commonly known as: Astepro Place 1 spray into the nose at bedtime as needed (Sinus congestion, allergies).   cyclobenzaprine 5 MG tablet Commonly known as: FLEXERIL Take 1 tablet (5 mg total) by mouth 3 (three) times daily as needed for muscle spasms.   docusate sodium 100 MG capsule Commonly known as: COLACE Take 1 capsule (100 mg total) by mouth 2 (two) times daily. What  changed:  when to take this reasons to take this   ezetimibe 10 MG tablet Commonly known as: ZETIA TAKE 1 TABLET BY MOUTH ONCE DAILY.   famotidine 20 MG tablet Commonly known as: PEPCID Take 20 mg by mouth daily.   fluticasone 50 MCG/ACT nasal spray Commonly known as: FLONASE Place 1 spray into both nostrils daily as needed for allergies or rhinitis.   furosemide 20 MG tablet Commonly known as: LASIX Take 1 tablet by mouth every third day as directed.   gabapentin 600 MG tablet Commonly known as: NEURONTIN Take 600 mg by mouth 3 (three) times daily.   HYDROcodone-acetaminophen 5-325 MG tablet Commonly known as: NORCO/VICODIN Take 1-2 tablets by mouth every 4 (four) hours as needed for moderate pain.   ipratropium-albuterol 0.5-2.5 (3) MG/3ML Soln Commonly known as: DUONEB Take 3 mLs by nebulization every 6 (six) hours as needed.   isosorbide mononitrate 30 MG 24 hr tablet Commonly known as: IMDUR Take 0.5 tablets (15 mg total) by mouth daily. What changed: when to take this   losartan 25 MG tablet Commonly known as: COZAAR TAKE 1 TABLET BY MOUTH DAILY   nitroGLYCERIN 0.4 MG SL tablet Commonly known as: NITROSTAT Place 1 tablet (0.4 mg total) under the tongue every 5 (five) minutes as needed for chest pain.   polyethylene glycol 17 g packet Commonly known as: MIRALAX / GLYCOLAX Take 17 g by mouth daily as needed for mild constipation.   ProAir HFA 108 (90 Base) MCG/ACT inhaler Generic drug: albuterol TAKE 2 PUFFS INTO LUNGS EVERY 6 HOURS ASNEEDED FOR WHEEZING OR SHORTNESS OF BREATH What changed: See the new instructions.   ranolazine 500 MG 12 hr tablet Commonly known as: RANEXA TAKE ONE (1) TABLET BY MOUTH TWO TIMES PER DAY What changed: See the new instructions.   tamsulosin 0.4 MG Caps capsule Commonly known as: FLOMAX TAKE 1 CAPSULE BY MOUTH AT BEDTIME   Trelegy Ellipta 200-62.5-25 MCG/ACT Aepb Generic drug: Fluticasone-Umeclidin-Vilant Inhale 1  puff into the lungs daily.         Signed: Ophelia Charter 07/12/2022, 7:42 AM

## 2022-07-12 NOTE — Plan of Care (Signed)
  Problem: Education: Goal: Ability to verbalize activity precautions or restrictions will improve Outcome: Completed/Met Goal: Knowledge of the prescribed therapeutic regimen will improve Outcome: Completed/Met Goal: Understanding of discharge needs will improve Outcome: Completed/Met   

## 2022-07-17 DIAGNOSIS — G4733 Obstructive sleep apnea (adult) (pediatric): Secondary | ICD-10-CM | POA: Diagnosis not present

## 2022-07-17 DIAGNOSIS — I10 Essential (primary) hypertension: Secondary | ICD-10-CM | POA: Diagnosis not present

## 2022-07-17 DIAGNOSIS — R0683 Snoring: Secondary | ICD-10-CM | POA: Diagnosis not present

## 2022-07-20 ENCOUNTER — Ambulatory Visit (INDEPENDENT_AMBULATORY_CARE_PROVIDER_SITE_OTHER): Payer: PPO | Admitting: Family Medicine

## 2022-07-20 ENCOUNTER — Encounter: Payer: Self-pay | Admitting: Family Medicine

## 2022-07-20 VITALS — BP 100/60 | HR 89 | Temp 97.9°F | Ht 69.0 in | Wt 214.5 lb

## 2022-07-20 DIAGNOSIS — J208 Acute bronchitis due to other specified organisms: Secondary | ICD-10-CM | POA: Diagnosis not present

## 2022-07-20 MED ORDER — DOXYCYCLINE HYCLATE 100 MG PO TABS: 100.0000 mg | ORAL_TABLET | Freq: Two times a day (BID) | ORAL | 0 refills | Status: DC

## 2022-07-20 MED ORDER — GUAIFENESIN-CODEINE 100-10 MG/5ML PO SYRP: 5.0000 mL | ORAL_SOLUTION | Freq: Three times a day (TID) | ORAL | 0 refills | Status: DC | PRN

## 2022-07-20 NOTE — Progress Notes (Signed)
[name] Schuermann T. Aarian Cleaver, MD, [facility] [place], [address]  Phone: [phone]  FAX: [place] - 81 y.o. male  MRN [id number]  Date of Birth: 11/30/1940  Date: 07/20/2022  PCP: Ria Bush, MD  Referral: Ria Bush, MD  Chief Complaint  Patient presents with   Cough    Hx of Bronchitis  No Covid Test   Subjective:   [name] is a 81 y.o. very pleasant male patient with Body mass index is 31.68 kg/m. who presents with the following:  07/11/2022 had an anterior fusion.   H/o bronchitis.  Ongoing now for 3 different times recently.  - never really got better, did a virtual three weeks ago.   She has had some intermittent coughing and production of sputum has been present off and on for the last 2 to 3 months.  Most recently, he had a virtual visit with Dr. Caryl Bis, at that point he was placed on some Augmentin.  He did think that he got better for a while, but after his neck fusion 10 days ago, he thinks that he is worsening again.  He does have a productive cough is productive of colored sputum.  He did have a clear cough prior to fusion.  He is coughing to the point where he is having pain and pain in his neck.  This was his fifth spine surgery.  Review of Systems is noted in the HPI, as appropriate  Objective:   BP 100/60   Pulse 89   Temp 97.9 F (36.6 C) (Oral)   Ht '5\' 9"'$  (1.753 m)   Wt 214 lb 8 oz (97.3 kg)   SpO2 96%   BMI 31.68 kg/m    Gen: WDWN, NAD. Globally Non-toxic HEENT: Throat clear, w/o exudate, R TM clear, L TM - good landmarks, No fluid present. rhinnorhea.  MMM Frontal sinuses: NT Max sinuses: NT NECK: Anterior cervical  LAD is absent CV: RRR, No M/G/R, cap refill <2 sec PULM: Breathing comfortably in no respiratory distress. no wheezing, crackles, rhonchi   Laboratory and Imaging Data:  Assessment and Plan:     ICD-10-CM    1. Acute bronchitis due to other specified organisms  J20.8      Prolonged respiratory illness in an 82 year old.  Unclear if this is a new infection or this is been a persistent lower respiratory infection that has been lasting weeks to months.  It sounds as if this has been present now on the order of weeks.  I would have placed him on some doxycycline and give him some Robitussin with codeine.  Relative contraindication to using steroids even in a patient with COPD 10 days after anterior cervical fusion.  Medication Management during today's office visit: Meds ordered this encounter  Medications   guaiFENesin-codeine (ROBITUSSIN AC) 100-10 MG/5ML syrup    Sig: Take 5 mLs by mouth 3 (three) times daily as needed for cough.    Dispense:  120 mL    Refill:  0   doxycycline (VIBRA-TABS) 100 MG tablet    Sig: Take 1 tablet (100 mg total) by mouth 2 (two) times daily.    Dispense:  20 tablet    Refill:  0   There are no discontinued medications.  Orders placed today for conditions managed today: No orders of the defined types were placed in this encounter.   Disposition: No follow-ups on file.  Systems analyst  One speech-to-text software was used for transcription in this dictation.  Possible transcriptional errors can occur using Editor, commissioning.   Signed,  Maud Deed. Marjon Doxtater, MD   Outpatient Encounter Medications as of 07/20/2022  Medication Sig   aspirin EC 81 MG EC tablet Take 1 tablet (81 mg total) by mouth daily.   atorvastatin (LIPITOR) 20 MG tablet TAKE 1 TABLET BY MOUTH DAILY.   Azelastine HCl (ASTEPRO) 0.15 % SOLN Place 1 spray into the nose at bedtime as needed (Sinus congestion, allergies).   cyclobenzaprine (FLEXERIL) 5 MG tablet Take 1 tablet (5 mg total) by mouth 3 (three) times daily as needed for muscle spasms.   docusate sodium (COLACE) 100 MG capsule Take 1 capsule (100 mg total) by mouth 2 (two) times daily.   doxycycline (VIBRA-TABS) 100 MG tablet Take 1  tablet (100 mg total) by mouth 2 (two) times daily.   ezetimibe (ZETIA) 10 MG tablet TAKE 1 TABLET BY MOUTH ONCE DAILY.   famotidine (PEPCID) 20 MG tablet Take 20 mg by mouth daily.   fluticasone (FLONASE) 50 MCG/ACT nasal spray Place 1 spray into both nostrils daily as needed for allergies or rhinitis.   Fluticasone-Umeclidin-Vilant (TRELEGY ELLIPTA) 200-62.5-25 MCG/ACT AEPB Inhale 1 puff into the lungs daily.   furosemide (LASIX) 20 MG tablet Take 1 tablet by mouth every third day as directed.   gabapentin (NEURONTIN) 600 MG tablet Take 600 mg by mouth 3 (three) times daily.   guaiFENesin-codeine (ROBITUSSIN AC) 100-10 MG/5ML syrup Take 5 mLs by mouth 3 (three) times daily as needed for cough.   HYDROcodone-acetaminophen (NORCO/VICODIN) 5-325 MG tablet Take 1-2 tablets by mouth every 4 (four) hours as needed for moderate pain.   ipratropium-albuterol (DUONEB) 0.5-2.5 (3) MG/3ML SOLN Take 3 mLs by nebulization every 6 (six) hours as needed.   isosorbide mononitrate (IMDUR) 30 MG 24 hr tablet Take 0.5 tablets (15 mg total) by mouth daily.   losartan (COZAAR) 25 MG tablet TAKE 1 TABLET BY MOUTH DAILY   nitroGLYCERIN (NITROSTAT) 0.4 MG SL tablet Place 1 tablet (0.4 mg total) under the tongue every 5 (five) minutes as needed for chest pain.   polyethylene glycol (MIRALAX / GLYCOLAX) 17 g packet Take 17 g by mouth daily as needed for mild constipation.    PROAIR HFA 108 (90 Base) MCG/ACT inhaler TAKE 2 PUFFS INTO LUNGS EVERY 6 HOURS ASNEEDED FOR WHEEZING OR SHORTNESS OF BREATH   ranolazine (RANEXA) 500 MG 12 hr tablet TAKE ONE (1) TABLET BY MOUTH TWO TIMES PER DAY   tamsulosin (FLOMAX) 0.4 MG CAPS capsule TAKE 1 CAPSULE BY MOUTH AT BEDTIME   No facility-administered encounter medications on file as of 07/20/2022.

## 2022-07-21 ENCOUNTER — Ambulatory Visit (INDEPENDENT_AMBULATORY_CARE_PROVIDER_SITE_OTHER): Payer: PPO | Admitting: Urology

## 2022-07-21 ENCOUNTER — Encounter: Payer: Self-pay | Admitting: Urology

## 2022-07-21 VITALS — BP 115/66 | HR 89 | Ht 72.0 in | Wt 208.0 lb

## 2022-07-21 DIAGNOSIS — Z8551 Personal history of malignant neoplasm of bladder: Secondary | ICD-10-CM | POA: Diagnosis not present

## 2022-07-21 DIAGNOSIS — N401 Enlarged prostate with lower urinary tract symptoms: Secondary | ICD-10-CM

## 2022-07-21 DIAGNOSIS — R339 Retention of urine, unspecified: Secondary | ICD-10-CM

## 2022-07-21 DIAGNOSIS — C679 Malignant neoplasm of bladder, unspecified: Secondary | ICD-10-CM

## 2022-07-21 LAB — URINALYSIS, COMPLETE
Bilirubin, UA: NEGATIVE
Glucose, UA: NEGATIVE
Ketones, UA: NEGATIVE
Leukocytes,UA: NEGATIVE
Nitrite, UA: NEGATIVE
Protein,UA: NEGATIVE
RBC, UA: NEGATIVE
Specific Gravity, UA: 1.01 (ref 1.005–1.030)
Urobilinogen, Ur: 0.2 mg/dL (ref 0.2–1.0)
pH, UA: 5 (ref 5.0–7.5)

## 2022-07-21 LAB — MICROSCOPIC EXAMINATION

## 2022-07-21 LAB — BLADDER SCAN AMB NON-IMAGING: Scan Result: 259

## 2022-07-21 NOTE — Progress Notes (Signed)
   07/21/22  CC:  Chief Complaint  Patient presents with   Cysto   Urologic history: 1.  Ta high-grade urothelial carcinoma bladder Incidentally noted on pelvic MRI oh ordered by orthopedics to have a 15 x 20 mm bladder mass near right UO; cystoscopy with 3 cm papillary tumor TURBT 10/19/2025 with high-grade tumor and a low-grade tumor left posterolateral wall Induction BCG x6 completed 01/20/2022 Maintenance BCG x 3: 05/23/2022 MR urogram 03/2022 showed no upper tract abnormalities  2.  BPH with LUTS Cystoscopy moderate lateral lobe enlargement/bladder neck elevation On tamsulosin and tolterodine   HPI: No complaints today.  Repeat PVR 259 mL.  Had cervical spine surgery last month  Blood pressure 135/74, pulse 90, height 6' (1.829 m), weight 210 lb (95.3 kg).   Cystoscopy Procedure Note  Patient identification was confirmed, informed consent was obtained, and patient was prepped using Betadine solution.  Lidocaine jelly was administered per urethral meatus.     Pre-Procedure: - Inspection reveals a normal caliber urethral meatus.  Procedure: The flexible cystoscope was introduced without difficulty - No urethral strictures/lesions are present. - Prostate moderate lateral lobe enlargement -  Moderate elevation  bladder neck - Bilateral ureteral orifices identified - Bladder mucosa  reveals no ulcers, tumors, or lesions - Small area of erythema just posterior to right UO - No bladder stones - Mild-moderate trabeculation  Retroflexion shows no abnormalities/tumor   Post-Procedure: - Patient tolerated the procedure well  Assessment/ Plan: No evidence recurrent urothelial carcinoma Small area of erythema posterolateral to right UO; urine cytology ordered If urine cytology negative follow-up surveillance cystoscopy March 2024 Maintenance BCG April 2024    Abbie Sons, MD

## 2022-07-25 LAB — CYTOLOGY - NON PAP

## 2022-07-26 ENCOUNTER — Encounter: Payer: Self-pay | Admitting: *Deleted

## 2022-07-26 DIAGNOSIS — H6522 Chronic serous otitis media, left ear: Secondary | ICD-10-CM | POA: Diagnosis not present

## 2022-07-29 ENCOUNTER — Ambulatory Visit (INDEPENDENT_AMBULATORY_CARE_PROVIDER_SITE_OTHER): Payer: PPO | Admitting: Pulmonary Disease

## 2022-07-29 ENCOUNTER — Encounter: Payer: Self-pay | Admitting: Pulmonary Disease

## 2022-07-29 VITALS — BP 122/80 | HR 77 | Temp 97.0°F | Ht 72.0 in | Wt 209.2 lb

## 2022-07-29 DIAGNOSIS — J45901 Unspecified asthma with (acute) exacerbation: Secondary | ICD-10-CM

## 2022-07-29 DIAGNOSIS — J4489 Other specified chronic obstructive pulmonary disease: Secondary | ICD-10-CM | POA: Diagnosis not present

## 2022-07-29 DIAGNOSIS — J4540 Moderate persistent asthma, uncomplicated: Secondary | ICD-10-CM

## 2022-07-29 DIAGNOSIS — I272 Pulmonary hypertension, unspecified: Secondary | ICD-10-CM

## 2022-07-29 DIAGNOSIS — G4733 Obstructive sleep apnea (adult) (pediatric): Secondary | ICD-10-CM | POA: Diagnosis not present

## 2022-07-29 LAB — NITRIC OXIDE: Nitric Oxide: 54

## 2022-07-29 MED ORDER — TRELEGY ELLIPTA 200-62.5-25 MCG/ACT IN AEPB: 1.0000 | INHALATION_SPRAY | Freq: Every day | RESPIRATORY_TRACT | 0 refills | Status: DC

## 2022-07-29 MED ORDER — ALBUTEROL SULFATE (2.5 MG/3ML) 0.083% IN NEBU: 2.5000 mg | INHALATION_SOLUTION | Freq: Four times a day (QID) | RESPIRATORY_TRACT | 12 refills | Status: DC | PRN

## 2022-07-29 NOTE — Progress Notes (Addendum)
Subjective:    Patient ID: [name], male    DOB: 1940/11/04, 81 y.o.   MRN: [id number] Patient Care Team: Ria Bush, MD as PCP - General (Family Medicine) Debbora Dus, [facility] as Pharmacist (Pharmacist) [name], MD as Consulting Physician (Cardiology) Tyler Pita, MD as Consulting Physician (Pulmonary Disease) Chesley Mires, MD as Consulting Physician (Pulmonary Disease)  Chief Complaint  Patient presents with   Follow-up    No SOB, wheezing or cough.     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 Patient was last evaluated on 15 June 2022 at that time he was having issues with recurrent "bronchitis".  He was noted to have an elevated nitric oxide even on a prednisone taper at that time.  He was placed back on Trelegy Ellipta 200 which he states has helped him the most previously.  He complains of the cost of the medication however.  Three weeks ago he had anterior cervical decompression surgery.  He developed "bronchitis" after the surgery and was placed on doxycycline which he has just completed.  His compliance with his CPAP was erratic during his postoperative period.  Currently he does not endorse any fevers, chills or sweats.  Since resuming Trelegy he has not had a cough of note.  No recent use of rescue inhaler.  He does need nebulizer solution to have handy in case of need.  Has not had chest pain, no lower extremity edema or calf tenderness.  Overall, today, he feels well and looks well.   Review of Systems A 10 point review of systems was performed and it is as noted above otherwise negative.  Patient Active Problem List   Diagnosis Date Noted   Cervical spondylosis with radiculopathy 07/11/2022   Acute bronchitis with COPD ([place]) 06/06/2022   Right low back pain 12/29/2021   COPD exacerbation ([place]) 11/12/2021   Bladder cancer ([place])  11/12/2021   Acute-on-chronic kidney injury ([place]) 07/09/2021   OSA (obstructive sleep apnea)    Acute on chronic diastolic CHF (congestive heart failure) ([place])    Right leg pain 06/22/2021   Nocturnal hypoxia 06/11/2021   CKD (chronic kidney disease) stage 3, GFR 30-59 ml/min ([place]) 02/24/2021   Peripheral neuropathy 02/23/2021   Spondylolisthesis of lumbar region 07/07/2020   COPD (chronic obstructive pulmonary disease) ([place]) 01/14/2020   Coccydynia 09/20/2019   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   Anxiety 06/04/2019   Chronic right shoulder pain 09/10/2018   Cervical stenosis of spinal canal 06/05/2017   Pedal edema 06/05/2017   Health maintenance examination 12/06/2016   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/13/2016   Overweight (BMI 25.0-29.9) 07/04/2016   Low vitamin B12 level 01/01/2016   Exertional dyspnea 07/07/2015   Medicare annual wellness visit, subsequent 07/03/2015   Advanced care planning/counseling discussion 07/03/2015   Acute recurrent frontal sinusitis 05/05/2015   Type 2 diabetes mellitus with other specified complication ([place]) 23/76/2831   Pleuritic chest pain 05/04/2015   Ex-smoker 05/04/2015   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   Spermatocele 04/02/2013   Syncopal vertigo 11/04/2010   Lumbar disc disease with radiculopathy 11/04/2010   CAD, ARTERY BYPASS GRAFT 08/11/2009   PAD (peripheral artery disease) ([place]) 08/11/2009   DUPUYTREN'S CONTRACTURE, 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   Carotid stenosis 07/09/2008   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   Benign prostatic hyperplasia with urinary obstruction 09/06/2007   Essential hypertension 04/30/2007   GERD  04/30/2007   Hyperlipidemia 04/26/2007   OA (osteoarthritis) of knee 04/26/2007   DVT, HX OF 04/26/2007   Social History   Tobacco Use   Smoking status: Former    Packs/day: 1.00    Years: 25.00    Total pack years: 25.00    Types: Cigarettes    Quit date: 08/15/1988    Years since  quitting: 33.9    Passive exposure: Past   Smokeless tobacco: Never  Substance Use Topics   Alcohol use: No    Alcohol/week: 0.0 standard drinks of alcohol   Allergies  Allergen Reactions   Vioxx [Rofecoxib] Other (See Comments)    Hemorrhage    Spiriva Respimat [Tiotropium Bromide Monohydrate] Other (See Comments)    Elevated bp   Contrast Media [Iodinated Contrast Media] Itching and Rash    Delayed reaction post abdominal aortagram.    Morphine Nausea Only and Other (See Comments)    Irritability    Current Meds  Medication Sig   aspirin EC 81 MG EC tablet Take 1 tablet (81 mg total) by mouth daily.   atorvastatin (LIPITOR) 20 MG tablet TAKE 1 TABLET BY MOUTH DAILY.   Azelastine HCl (ASTEPRO) 0.15 % SOLN Place 1 spray into the nose at bedtime as needed (Sinus congestion, allergies).   cyclobenzaprine (FLEXERIL) 5 MG tablet Take 1 tablet (5 mg total) by mouth 3 (three) times daily as needed for muscle spasms.   docusate sodium (COLACE) 100 MG capsule Take 1 capsule (100 mg total) by mouth 2 (two) times daily.   doxycycline (VIBRA-TABS) 100 MG tablet Take 1 tablet (100 mg total) by mouth 2 (two) times daily.   ezetimibe (ZETIA) 10 MG tablet TAKE 1 TABLET BY MOUTH ONCE DAILY.   famotidine (PEPCID) 20 MG tablet Take 20 mg by mouth daily.   fluticasone (FLONASE) 50 MCG/ACT nasal spray Place 1 spray into both nostrils daily as needed for allergies or rhinitis.   Fluticasone-Umeclidin-Vilant (TRELEGY ELLIPTA) 200-62.5-25 MCG/ACT AEPB Inhale 1 puff into the lungs daily.   furosemide (LASIX) 20 MG tablet Take 1 tablet by mouth every third day as directed.   gabapentin (NEURONTIN) 600 MG tablet Take 600 mg by mouth 3 (three) times daily.   guaiFENesin-codeine (ROBITUSSIN AC) 100-10 MG/5ML syrup Take 5 mLs by mouth 3 (three) times daily as needed for cough.   HYDROcodone-acetaminophen (NORCO/VICODIN) 5-325 MG tablet Take 1-2 tablets by mouth every 4 (four) hours as needed for moderate pain.    ipratropium-albuterol (DUONEB) 0.5-2.5 (3) MG/3ML SOLN Take 3 mLs by nebulization every 6 (six) hours as needed.   isosorbide mononitrate (IMDUR) 30 MG 24 hr tablet Take 0.5 tablets (15 mg total) by mouth daily.   losartan (COZAAR) 25 MG tablet TAKE 1 TABLET BY MOUTH DAILY   nitroGLYCERIN (NITROSTAT) 0.4 MG SL tablet Place 1 tablet (0.4 mg total) under the tongue every 5 (five) minutes as needed for chest pain.   polyethylene glycol (MIRALAX / GLYCOLAX) 17 g packet Take 17 g by mouth daily as needed for mild constipation.    PROAIR HFA 108 (90 Base) MCG/ACT inhaler TAKE 2 PUFFS INTO LUNGS EVERY 6 HOURS ASNEEDED FOR WHEEZING OR SHORTNESS OF BREATH   ranolazine (RANEXA) 500 MG 12 hr tablet TAKE ONE (1) TABLET BY MOUTH TWO TIMES PER DAY   tamsulosin (FLOMAX) 0.4 MG CAPS capsule TAKE 1 CAPSULE BY MOUTH AT BEDTIME   Immunization History  Administered Date(s) Administered   Fluad Quad(high Dose 65+) 04/17/2019   Influenza Whole 08/15/2000, 07/04/2007,  05/09/2008, 05/31/2010   Influenza, High Dose Seasonal PF 05/15/2013, 06/25/2015, 07/05/2021   Influenza,inj,Quad PF,6+ Mos 04/17/2014, 05/18/2016, 06/07/2018   Influenza-Unspecified 05/22/2017, 05/28/2020   PFIZER(Purple Top)SARS-COV-2 Vaccination 09/07/2019, 09/28/2019, 05/12/2020, 01/19/2021   Pneumococcal Conjugate-13 02/09/2015   Pneumococcal Polysaccharide-23 08/15/2000, 10/29/2008   Td 08/15/2005   Tdap 02/10/2018   Zoster Recombinat (Shingrix) 04/05/2017, 07/25/2017   Zoster, Live 02/04/2009       Objective:   Physical Exam BP 122/80 (BP Location: Left Arm, Cuff Size: Large)   Pulse 77   Temp (!) 97 F (36.1 C)   Ht 6' (1.829 m)   Wt 209 lb 3.2 oz (94.9 kg)   SpO2 100%   BMI 28.37 kg/m  GENERAL: This is a well-developed, overweight gentleman, chronically ill-appearing, who is awake, alert, no acute distress.  Fully ambulatory. HEAD: Normocephalic, atraumatic.  EYES: Pupils equal, round, reactive to light.  No scleral icterus.   MOUTH: Nose/mouth/throat not examined due to masking requirements per hospital guidelines. NECK: Supple. No thyromegaly. Trachea midline. No JVD.  No adenopathy.  Anterior cervical fusion incision, healing well no evidence of redness, induration or purulence. PULMONARY: Good air entry bilaterally, coarse breath sounds otherwise, no adventitious sounds.  CARDIOVASCULAR: S1 and S2. Regular rate and rhythm.  Grade 1/6 systolic ejection murmur left sternal border. GASTROINTESTINAL: Protuberant abdomen, soft, no tenderness.   MUSCULOSKELETAL: No joint deformity, no clubbing, no edema.  NEUROLOGIC: No overt focal deficits noted.  Speech is fluent.   SKIN: Intact,warm,dry.  Limited exam shows no rashes.  He has multiple purpura and ecchymoses in the upper extremities. PSYCH: Mood and behavior normal.   Lab Results  Component Value Date   NITRICOXIDE 54 07/29/2022      Assessment & Plan:     ICD-10-CM   1. Moderate persistent asthma without complication  J19.14 Nitric oxide   Continue Trelegy 200 FENO 54 today Continue as needed albuterol    2. COPD with asthma  J44.89    COPD asthma overlap, continue Trelegy    3. OSA (obstructive sleep apnea)  G47.33    Transient break in therapy due to neck surgery Still with break in therapy 80% compliance Continue CPAP therapy set 5 to 15 cm H2O    4. Pulmonary hypertension (HCC) - mild/moderate  I27.20    Multifactorial Cardiomyopathy/COPD/OSA     Orders Placed This Encounter  Procedures   Nitric oxide   Meds ordered this encounter  Medications   Fluticasone-Umeclidin-Vilant (TRELEGY ELLIPTA) 200-62.5-25 MCG/ACT AEPB    Sig: Inhale 1 puff into the lungs daily.    Dispense:  42 each    Refill:  0    Order Specific Question:   Lot Number?    Answer:   [id number]    Order Specific Question:   Expiration Date?    Answer:   01/14/2024    Order Specific Question:   Quantity    Answer:   3   albuterol (PROVENTIL) (2.5 MG/3ML) 0.083% nebulizer  solution    Sig: Take 3 mLs (2.5 mg total) by nebulization every 6 (six) hours as needed for wheezing or shortness of breath.    Dispense:  75 mL    Refill:  12   Patient appears relatively well compensated.  Continue therapy as above.  Will see him in follow-up in 2 to 3 months time he is to contact us prior to that time should any new difficulties arise.  Renold Don, MD Advanced Bronchoscopy PCCM [facility]    *This  note was dictated using voice recognition software/Dragon.  Despite best efforts to proofread, errors can occur which can change the meaning. Any transcriptional errors that result from this process are unintentional and may not be fully corrected at the time of dictation.

## 2022-07-29 NOTE — Patient Instructions (Signed)
Continue using your Trelegy.  We have provided you with samples.  Let us know if your insurance company has a better co-pay with 47-monthsupply or if you need [place] choose something different.  Your insurance company should tell you what is covered.  I would wait after the first of the year since plans are changing on 1 January.  Continue using your CPAP.  We will see you in follow-up in 2 to 3 months time call sooner should any new problems arise.

## 2022-08-02 DIAGNOSIS — M4802 Spinal stenosis, cervical region: Secondary | ICD-10-CM | POA: Diagnosis not present

## 2022-08-02 NOTE — Progress Notes (Signed)
Cardiology Office Note  Date:  08/05/2022   ID:  [name], DOB 18-Oct-1940, MRN [id number]  PCP:  Ria Bush, MD   Chief Complaint  Patient presents with   6 month follow up     "Doing well." Medications reviewed by the patient verbally.     HPI:  81 year old gentleman with hx of OSA, did not tolerate CPAP mild  bilateral carotid arterial disease  <39% bilaterally 08/2016 hyperlipidemia  coronary artery disease,  bypass in 1990,  stenting to the circumflex in 2002,  Last cath 06/2015, patent grafts x 2, RCA small vessel diffuse disease DVT in the left lower extremity,  Completed back surgery for spinal stenosis, 06/20/2016 Cervical neck surgery 01/2017, [name]  total knee surgery in 02/2017 covid 05/20/2019 EF was 35 to 40% in 06/2019 in setting of covid 10/20 Repeat echo February 2021  ejection fraction 50 to 55% COPD/asthma who presents for routine followup  Of his coronary artery disease, post  Covid  Last seen by myself in clinic June 2023 Seen by Dr.Arida for PAD in October 2023, medical management recommended  Had neck surgery, anterior cervical approach Hurting today Wearing a brace sometimes, driving now Reports wife is sick at home with bronchitis, he is also getting over bronchitis  Denies chest pain BP stable Followed by pulmonary: TRELEGY ELLIPTA Helps the breathing, "I have asthma/copd"  Labs reviewed CRI: CR 1.48, BUN 34 GFR 47  GERD: off PPI Started on tums and pepcid  Hx of Bladder cancer, completed 6 treatments  Not requiring nitro for chest pain On isosorbide 15 mg daily with Ranexa Not taking much Lasix, only prior ankle swelling Trace left lower extremity edema likely from vein donor site for CABG  No regular exercise program, limited by arthritides  On CPAP with oxygen,  EKG personally reviewed by myself on todays visit Nsr rate 79 bpm LAD  Echo 8/22 reviewed  1. Left ventricular ejection fraction, by  estimation, is 50 to 55%. The  left ventricle has low normal function. The left ventricle has no regional  wall motion abnormalities. There is mild left ventricular hypertrophy.  Left ventricular diastolic  parameters are consistent with Grade I diastolic dysfunction (impaired  relaxation).   2. Right ventricular systolic function is normal. The right ventricular  size is normal. There is mildly elevated pulmonary artery systolic  pressure. The estimated right ventricular systolic pressure is 03.5 mmHg.   3. Left atrial size was mild to moderately dilated.   4. The mitral valve is normal in structure. Trivial mitral valve  regurgitation. No evidence of mitral stenosis.   5. The aortic valve is normal in structure. Aortic valve regurgitation is  not visualized. Mild to moderate aortic valve sclerosis/calcification is  present, without any evidence of aortic stenosis.   Stress test 04/2021, results discussed Pharmacological myocardial perfusion imaging study with no significant  ischemia Normal wall motion, EF estimated at 52% No EKG changes concerning for ischemia at peak stress or in recovery. Low risk scan  Other recent events,  Stress test September 2022 Low risk, probably normal pharmacologic myocardial perfusion stress test.  LE arterial hx, reviewed today 2.  Right lower extremity: Moderately to severely calcified SFA with diffuse disease throughout its course especially in the midsegment and three-vessel runoff below the knee. 3.  Left lower extremity: Borderline significant SFA disease which is moderately to severely calcified and three-vessel runoff below the knee.  pulmonary appointment sept, 2021  postinflammatory pulmonary issues post Covid COPD  poorly compensated Changes to  his inhalers made  CT chest 06/2019 Diffuse multifocal areas of consolidation and ground-glass opacity throughout the lungs compatible with atypical infection in the setting of known COVID  positivity.  Stress test 03/2019 Pharmacological myocardial perfusion imaging study with no significant ischemia Small region mild fixed perfusion defect in the distal anteroseptal and apical region, unable to exclude old scar vs attenuation artifact EF estimated at 38%, septal wall hypokinesis, possibly secondary to CABG/post-operative state.  No EKG changes concerning for ischemia at peak stress or in recovery. Low risk scan  05/2019 COVID , and hospital 10 days treated with remdesivir and steroids, antibodies little bit of leg swelling but he attributed to not taking diuretics   Back in the hospital again for an additional 10-day stay October 28  he started to run another fever up to 101.3 has not had any chest pain or wheezing no headaches. blood pressure has been  running low in the 90s so he stopped using his lisinopril.  His blood pressure after that improved to 130s over 60s  CT chest,  . Diffuse multifocal areas of consolidation and ground-glass opacity throughout the lungs compatible with atypical infection in the setting of known COVID positivity.  LE arterial 08/2018,   Right lower extremity: Moderately to severely calcified SFA with diffuse disease throughout its course especially in the midsegment and three-vessel runoff below the knee.   AAA less than 3 cm, on scan in 2018 reviewed with him Numerous previous episode of chest pain relieved with belching consistent with GI etiology.  Previous cardiac catheterization 07/07/2015            Ost LM to LM lesion, 50% stenosed.            Ost RCA lesion, 80% stenosed.            Prox RCA-1 lesion, 80% stenosed.            Prox RCA-2 lesion, 80% stenosed.            LIMA was injected is moderate in size, and is anatomically normal.            Dist LAD lesion, 60% stenosed.            Ost LAD to Prox LAD lesion, 100% stenosed.            Mid LAD lesion, 60% stenosed.            The left ventricular systolic function  is normal.            SVG was injected is normal in caliber, and is anatomically normal.            2nd Diag lesion, 50% stenosed.    severe RCA disease (small vessel) not amenable to PCI, moderate left main disease, moderate LAD and proximal diagonal disease. 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), CKD (chronic kidney disease), stage III ([place]), Community acquired pneumonia of right lower lobe of lung (06/05/2017), COVID-19 virus infection (05/30/2019), DUPUYTREN'S CONTRACTURE, 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), DVT, HX OF (1998), Emphysema of lung ([place]), GERD (04/30/2007), Headache, Heart murmur, History of hiatal hernia,  History of shingles, HYPERLIPIDEMIA (04/26/2007), HYPERTENSION (04/30/2007), INGUINAL HERNIA, RIGHT (05/26/2010), Laceration of skin of left hand (03/08/2018), Lumbar disc disease with radiculopathy, Myocardial infarction ([place]), OSA (obstructive sleep apnea), Osteoarthritis, PAD (peripheral artery disease) ([place]), Pneumonia (07/2014), Pneumonia due to COVID-19 virus (06/02/2019), Pre-diabetes, Prostatitis, chronic, PSA, INCREASED (07/09/2008), and Spinal stenosis of lumbar region.  PSH:    Past Surgical History:  Procedure Laterality Date   ABDOMINAL AORTOGRAM W/LOWER EXTREMITY [name] 09/12/2018   Procedure: ABDOMINAL AORTOGRAM W/LOWER EXTREMITY;  Surgeon: [name], MD;  Location: [facility];  Service: Cardiovascular;  Laterality: [name];   ANTERIOR CERVICAL DECOMP/DISCECTOMY FUSION [name] 07/11/2022   Procedure: Anterior Cervical Decompression Fusion ,Interboy  Prothesis,Plate/Screws , Cervical four-five,Cervical five-six;  Surgeon: [name], MD;  Location: [facility];  Service: Neurosurgery;  Laterality: [name];   APPENDECTOMY     rupture   BACK SURGERY     1984 and then [facility]   BLADDER INSTILLATION [name] 10/19/2021   Procedure: BLADDER INSTILLATION OF GEMCITABINE;  Surgeon: Abbie Sons, MD;  Location: [facility];  Service: Urology;  Laterality: [name];   CARDIAC CATHETERIZATION [name] 07/07/2015   Procedure: Left Heart Cath and Cors/Grafts Angiography;  Surgeon: [name], MD;  Location: [facility];  Service: Cardiovascular;  Laterality: [name];   CATARACT EXTRACTION, BILATERAL  2009   CERVICAL SPINE SURGERY  12/2016   cervical stenosis [name])   COLONOSCOPY  03/2010   HP polyp, diverticulosis, rpt 10 yrs ([name])   CORONARY ANGIOPLASTY  11/30/2000   [facility]   CORONARY ARTERY BYPASS GRAFT  1990   3 vessel    CYSTOSCOPY  12/23/2010   [facility]   EYE SURGERY     cataract   JOINT REPLACEMENT     KNEE ARTHROSCOPY Right    x2   LAMINOTOMY  1986   L5/S1 lumbar laminotomy for two ruptured discs/fusion   LUMBAR LAMINECTOMY/DECOMPRESSION MICRODISCECTOMY [name] 07/13/2016   Procedure: LUMBAR TWO-THREE, LUMBAR THREE-FOUR, LUMBAR FOUR-FIVE LAMINECTOMY AND FORAMINOTOMY;  Surgeon: [name], MD;  Location: [facility];  Service: Neurosurgery;  Laterality: [name];  LAMINECTOMY AND FORAMINOTOMY L2-L3, L3-L4,L4-L5   LUMBAR SPINE SURGERY  06/2020   Bilateral redo laminectomy/laminotomy/foraminotomies/medial facetectomy to decompress the bilateral L4 and L5 nerve roots [name])   TOTAL HIP ARTHROPLASTY Bilateral [phone]   TOTAL KNEE ARTHROPLASTY Right 03/06/2017   Procedure: RIGHT TOTAL KNEE ARTHROPLASTY;  Surgeon: Gaynelle Arabian, MD;  Location: [facility];  Service: Orthopedics;  Laterality: Right;   TRANSURETHRAL RESECTION OF BLADDER TUMOR [name] 10/19/2021   Procedure: TRANSURETHRAL RESECTION OF BLADDER TUMOR (TURBT);  Surgeon: Abbie Sons, MD;   Location: [facility];  Service: Urology;  Laterality: [name];    Current Outpatient Medications  Medication Sig Dispense Refill   albuterol (PROVENTIL) (2.5 MG/3ML) 0.083% nebulizer solution Take 3 mLs (2.5 mg total) by nebulization every 6 (six) hours as needed for wheezing or shortness of breath. 75 mL 12   aspirin EC 81 MG EC tablet Take 1 tablet (81 mg total) by mouth daily. 30 tablet 0   atorvastatin (LIPITOR) 20 MG tablet TAKE 1 TABLET BY MOUTH DAILY. 90 tablet 3   Azelastine HCl (ASTEPRO) 0.15 % SOLN Place 1 spray into the nose at bedtime as needed (Sinus congestion, allergies). 30 mL 11   cyclobenzaprine (FLEXERIL) 5 MG tablet Take 1 tablet (5 mg total) by mouth 3 (three) times daily as needed for muscle spasms. 30 tablet 0   docusate sodium (COLACE) 100 MG capsule Take 1 capsule (100  mg total) by mouth 2 (two) times daily. 30 capsule 0   ezetimibe (ZETIA) 10 MG tablet TAKE 1 TABLET BY MOUTH ONCE DAILY. 90 tablet 2   famotidine (PEPCID) 20 MG tablet Take 20 mg by mouth daily.     fluticasone (FLONASE) 50 MCG/ACT nasal spray Place 1 spray into both nostrils daily as needed for allergies or rhinitis.     Fluticasone-Umeclidin-Vilant (TRELEGY ELLIPTA) 200-62.5-25 MCG/ACT AEPB Inhale 1 puff into the lungs daily. 180 each 1   furosemide (LASIX) 20 MG tablet Take 1 tablet by mouth every third day as directed. 30 tablet 2   gabapentin (NEURONTIN) 600 MG tablet Take 600 mg by mouth 3 (three) times daily.     HYDROcodone-acetaminophen (NORCO/VICODIN) 5-325 MG tablet Take 1-2 tablets by mouth every 4 (four) hours as needed for moderate pain. 30 tablet 0   isosorbide mononitrate (IMDUR) 30 MG 24 hr tablet Take 1 tablet (30 mg total) by mouth daily. Please call [phone] to schedule an appointment for future refills. Thank you. 90 tablet 0   losartan (COZAAR) 25 MG tablet TAKE 1 TABLET BY MOUTH DAILY 90 tablet 2   nitroGLYCERIN (NITROSTAT) 0.4 MG SL tablet Place 1 tablet (0.4 mg total) under the tongue  every 5 (five) minutes as needed for chest pain. 25 tablet 3   polyethylene glycol (MIRALAX / GLYCOLAX) 17 g packet Take 17 g by mouth daily as needed for mild constipation.      PROAIR HFA 108 (90 Base) MCG/ACT inhaler TAKE 2 PUFFS INTO LUNGS EVERY 6 HOURS ASNEEDED FOR WHEEZING OR SHORTNESS OF BREATH 8.5 g 6   ranolazine (RANEXA) 500 MG 12 hr tablet TAKE ONE (1) TABLET BY MOUTH TWO TIMES PER DAY 180 tablet 3   tamsulosin (FLOMAX) 0.4 MG CAPS capsule TAKE 1 CAPSULE BY MOUTH AT BEDTIME 90 capsule 1   doxycycline (VIBRA-TABS) 100 MG tablet Take 1 tablet (100 mg total) by mouth 2 (two) times daily. (Patient not taking: Reported on 08/05/2022) 20 tablet 0   Fluticasone-Umeclidin-Vilant (TRELEGY ELLIPTA) 200-62.5-25 MCG/ACT AEPB Inhale 1 puff into the lungs daily. (Patient not taking: Reported on 08/05/2022) 42 each 0   guaiFENesin-codeine (ROBITUSSIN AC) 100-10 MG/5ML syrup Take 5 mLs by mouth 3 (three) times daily as needed for cough. (Patient not taking: Reported on 08/05/2022) 120 mL 0   No current facility-administered medications for this visit.    Allergies:   Vioxx [rofecoxib], Spiriva respimat [tiotropium bromide monohydrate], Contrast media [iodinated contrast media], and Morphine   Social History:  The patient  reports that he quit smoking about 33 years ago. His smoking use included cigarettes. He has a 25.00 pack-year smoking history. He has been exposed to tobacco smoke. He has never used smokeless tobacco. He reports that he does not drink alcohol and does not use drugs.   Family History:   family history includes Cancer in his sister and another family member; Diabetes in his brother and mother; Heart attack in his mother; Heart disease in his father; Polycystic kidney disease in his father and sister; Stroke in his father and mother.   Review of Systems: Review of Systems  Constitutional:  Positive for malaise/fatigue.  HENT: Negative.    Respiratory: Negative.    Cardiovascular:   Positive for chest pain.  Gastrointestinal: Negative.   Musculoskeletal: Negative.   Neurological: Negative.   Psychiatric/Behavioral: Negative.    All other systems reviewed and are negative.   PHYSICAL EXAM: VS:  BP 120/60 (BP Location: Left Arm, Patient  Position: Sitting, Cuff Size: Normal)   Pulse 79   Ht 6' (1.829 m)   Wt 212 lb 8 oz (96.4 kg)   SpO2 97%   BMI 28.82 kg/m  , BMI Body mass index is 28.82 kg/m. Constitutional:  oriented to person, place, and time. No distress.  HENT:  Head: Grossly normal Eyes:  no discharge. No scleral icterus.  Neck: No JVD, no carotid bruits  Cardiovascular: Regular rate and rhythm, no murmurs appreciated Pulmonary/Chest: Clear to auscultation bilaterally, no wheezes or rails Abdominal: Soft.  no distension.  no tenderness.  Musculoskeletal: Normal range of motion Neurological:  normal muscle tone. Coordination normal. No atrophy Skin: Skin warm and dry Psychiatric: normal affect, pleasant  Recent Labs: 02/21/2022: ALT 10 06/14/2022: BUN 34; Creatinine, Ser 1.48; Hemoglobin 12.9; Platelets 199; Potassium 3.9; Sodium 140    Lipid Panel Lab Results  Component Value Date   CHOL 98 02/21/2022   HDL 32.20 (L) 02/21/2022   LDLCALC 49 02/21/2022   TRIG 83.0 02/21/2022      Wt Readings from Last 3 Encounters:  08/05/22 212 lb 8 oz (96.4 kg)  07/29/22 209 lb 3.2 oz (94.9 kg)  07/21/22 208 lb (94.3 kg)     ASSESSMENT AND PLAN:  Cardiomyopathy, post covid, recent bronchitis Low ejection fraction in the setting of COVID in 2020 Has since recovered, EF up to 50 -55% by echo 09/2019, same in 2022,  No new symptoms on today's visit On lasix as needed Recommend he continue losartan and imdur B-blocker previously held, fatigue side effects, COPD Appears relatively euvolemic  Cad with stable angina Currently with no symptoms of angina. No further workup at this time. Continue current medication regimen. Last stress test September  2022  Covid , 05/2019 With associated cardiomyopathy, drop in ejection fraction which has since recovered on subsequent echo  COPD/emphysema Followed by pulmonary Prior smoking history, Covid 2020 On inhalers Stable  Mixed hyperlipidemia Cholesterol is at goal on the current lipid regimen. No changes to the medications were made.  Essential hypertension Blood pressure is well controlled on today's visit. No changes made to the medications.  Bilateral carotid artery stenosis Mild bilateral carotid disease less than 39% bilaterally Continue aggressive lipid management  Diet-controlled diabetes mellitus ([place]) Well-controlled  PAD Follows with Dr. [name] ,  bilateral SFA disease No claudications Medical management has been recommended    Total encounter time more than 30 minutes  Greater than 50% was spent in counseling and coordination of care with the patient   No orders of the defined types were placed in this encounter.     Signed, Esmond Plants, M.D., Ph.D. 08/05/2022  [place], [place]

## 2022-08-03 ENCOUNTER — Other Ambulatory Visit: Payer: Self-pay | Admitting: Nurse Practitioner

## 2022-08-03 ENCOUNTER — Telehealth: Payer: Self-pay | Admitting: Pulmonary Disease

## 2022-08-03 MED ORDER — TRELEGY ELLIPTA 200-62.5-25 MCG/ACT IN AEPB
1.0000 | INHALATION_SPRAY | Freq: Every day | RESPIRATORY_TRACT | 0 refills | Status: DC
  Filled 2022-11-08 – 2022-11-09 (×2): qty 180, 90d supply, fill #0

## 2022-08-03 NOTE — Telephone Encounter (Signed)
90 day of Trelegy has been sent to preferred pharmacy per patient request.  He is aware and voiced his understanding.  Nothing further needed.

## 2022-08-05 ENCOUNTER — Ambulatory Visit: Payer: PPO | Attending: Cardiovascular Disease | Admitting: Cardiovascular Disease

## 2022-08-05 ENCOUNTER — Encounter: Payer: Self-pay | Admitting: Cardiovascular Disease

## 2022-08-05 VITALS — BP 120/60 | HR 79 | Ht 72.0 in | Wt 212.5 lb

## 2022-08-05 DIAGNOSIS — U071 COVID-19: Secondary | ICD-10-CM

## 2022-08-05 DIAGNOSIS — I255 Ischemic cardiomyopathy: Secondary | ICD-10-CM

## 2022-08-05 DIAGNOSIS — E785 Hyperlipidemia, unspecified: Secondary | ICD-10-CM | POA: Diagnosis not present

## 2022-08-05 DIAGNOSIS — I25118 Atherosclerotic heart disease of native coronary artery with other forms of angina pectoris: Secondary | ICD-10-CM | POA: Diagnosis not present

## 2022-08-05 DIAGNOSIS — N183 Chronic kidney disease, stage 3 unspecified: Secondary | ICD-10-CM

## 2022-08-05 DIAGNOSIS — I43 Cardiomyopathy in diseases classified elsewhere: Secondary | ICD-10-CM | POA: Diagnosis not present

## 2022-08-05 DIAGNOSIS — I739 Peripheral vascular disease, unspecified: Secondary | ICD-10-CM | POA: Diagnosis not present

## 2022-08-05 DIAGNOSIS — I25810 Atherosclerosis of coronary artery bypass graft(s) without angina pectoris: Secondary | ICD-10-CM | POA: Diagnosis not present

## 2022-08-05 DIAGNOSIS — I5022 Chronic systolic (congestive) heart failure: Secondary | ICD-10-CM

## 2022-08-05 MED ORDER — ISOSORBIDE MONONITRATE ER 30 MG PO TB24: 30.0000 mg | ORAL_TABLET | Freq: Every day | ORAL | 3 refills | Status: DC

## 2022-08-05 MED ORDER — EZETIMIBE 10 MG PO TABS: 10.0000 mg | ORAL_TABLET | Freq: Every day | ORAL | 3 refills | Status: DC

## 2022-08-05 NOTE — Patient Instructions (Addendum)
Medication Instructions:  No changes  If you need a refill on your cardiac medications before your next appointment, please call your pharmacy.    Lab work: No new labs needed   Testing/Procedures: No new testing needed   Follow-Up: At [facility], you and your health needs are our priority.  As part of our continuing mission to provide you with exceptional heart care, we have created designated Provider Care Teams.  These Care Teams include your primary Cardiologist (physician) and Advanced Practice Providers (APPs -  Physician Assistants and Nurse Practitioners) who all work together to provide you with the care you need, when you need it.  You will need a follow up appointment in 6 months  Providers on your designated Care Team:   Christopher Berge, NP Ryan Dunn, PA-C Cadence Furth, PA-C  COVID-19 Vaccine Information can be found at: [email or web address] For questions related to vaccine distribution or appointments, please email [email or web address] or call [phone].   

## 2022-08-09 DIAGNOSIS — I10 Essential (primary) hypertension: Secondary | ICD-10-CM | POA: Diagnosis not present

## 2022-08-09 DIAGNOSIS — G4733 Obstructive sleep apnea (adult) (pediatric): Secondary | ICD-10-CM | POA: Diagnosis not present

## 2022-08-16 DIAGNOSIS — G4733 Obstructive sleep apnea (adult) (pediatric): Secondary | ICD-10-CM | POA: Diagnosis not present

## 2022-08-16 DIAGNOSIS — I10 Essential (primary) hypertension: Secondary | ICD-10-CM | POA: Diagnosis not present

## 2022-08-17 ENCOUNTER — Ambulatory Visit (INDEPENDENT_AMBULATORY_CARE_PROVIDER_SITE_OTHER): Payer: PPO | Admitting: Podiatry

## 2022-08-17 DIAGNOSIS — R7303 Prediabetes: Secondary | ICD-10-CM | POA: Diagnosis not present

## 2022-08-17 DIAGNOSIS — G4733 Obstructive sleep apnea (adult) (pediatric): Secondary | ICD-10-CM | POA: Diagnosis not present

## 2022-08-17 DIAGNOSIS — I10 Essential (primary) hypertension: Secondary | ICD-10-CM | POA: Diagnosis not present

## 2022-08-17 DIAGNOSIS — G6289 Other specified polyneuropathies: Secondary | ICD-10-CM | POA: Diagnosis not present

## 2022-08-17 DIAGNOSIS — R0683 Snoring: Secondary | ICD-10-CM | POA: Diagnosis not present

## 2022-08-17 NOTE — Progress Notes (Signed)
  Subjective:  Patient ID: [name], male    DOB: [birth year],  MRN: [id number]  Chief Complaint  Patient presents with   Peripheral Neuropathy   PVD    82 y.o. male presents with the above complaint. History confirmed with patient.  He presents today for follow-up he says he has been recommended to see [place] for annual checkups.  He says he is not diabetic but prediabetic.  He has burning tingling pain neuropathy from his back.  Dr. [name] has him on 600 mg of gabapentin 3 times a day which controls it.  Objective:  Physical Exam: warm, good capillary refill, no trophic changes or ulcerative lesions, normal DP and PT pulses, and abnormal sensory exam with loss of pressure and station,.  Assessment:   1. Other polyneuropathy   2. Prediabetes      Plan:  Patient was evaluated and treated and all questions answered.  Discussed treatment of his neuropathy that is well-controlled on gabapentin.  We also discussed that Lyrica may also offer added or different benefit or relief he will discuss with his PCP or Dr. [name].  Overall circulation is okay no history of ulceration.  Advised to watch closely for evidence of calluses or ulceration.  Return to see [place] as needed.  He does not need annual diabetic foot examination as he is in the prediabetes range.  If this changes he will let us know  Return if symptoms worsen or fail to improve.

## 2022-09-07 DIAGNOSIS — Z87891 Personal history of nicotine dependence: Secondary | ICD-10-CM | POA: Diagnosis not present

## 2022-09-07 DIAGNOSIS — J449 Chronic obstructive pulmonary disease, unspecified: Secondary | ICD-10-CM | POA: Diagnosis not present

## 2022-09-07 DIAGNOSIS — Z7951 Long term (current) use of inhaled steroids: Secondary | ICD-10-CM | POA: Diagnosis not present

## 2022-09-12 DIAGNOSIS — M25511 Pain in right shoulder: Secondary | ICD-10-CM | POA: Diagnosis not present

## 2022-09-14 DIAGNOSIS — H34232 Retinal artery branch occlusion, left eye: Secondary | ICD-10-CM | POA: Diagnosis not present

## 2022-09-14 DIAGNOSIS — H02883 Meibomian gland dysfunction of right eye, unspecified eyelid: Secondary | ICD-10-CM | POA: Diagnosis not present

## 2022-09-14 DIAGNOSIS — H43813 Vitreous degeneration, bilateral: Secondary | ICD-10-CM | POA: Diagnosis not present

## 2022-09-14 LAB — HM DIABETES EYE EXAM

## 2022-09-17 DIAGNOSIS — G4733 Obstructive sleep apnea (adult) (pediatric): Secondary | ICD-10-CM | POA: Diagnosis not present

## 2022-09-17 DIAGNOSIS — R0683 Snoring: Secondary | ICD-10-CM | POA: Diagnosis not present

## 2022-09-17 DIAGNOSIS — I10 Essential (primary) hypertension: Secondary | ICD-10-CM | POA: Diagnosis not present

## 2022-09-26 ENCOUNTER — Other Ambulatory Visit: Payer: Self-pay | Admitting: Urology

## 2022-10-04 ENCOUNTER — Ambulatory Visit (INDEPENDENT_AMBULATORY_CARE_PROVIDER_SITE_OTHER): Payer: HMO | Admitting: Pulmonary Disease

## 2022-10-04 ENCOUNTER — Encounter: Payer: Self-pay | Admitting: Family Medicine

## 2022-10-04 ENCOUNTER — Telehealth: Payer: Self-pay

## 2022-10-04 ENCOUNTER — Telehealth: Payer: Self-pay | Admitting: Family Medicine

## 2022-10-04 ENCOUNTER — Other Ambulatory Visit
Admission: RE | Admit: 2022-10-04 | Discharge: 2022-10-04 | Disposition: A | Discharge status: Home / Self Care | Payer: HMO | Source: Ambulatory Visit | Attending: Pulmonary Disease | Admitting: Pulmonary Disease

## 2022-10-04 ENCOUNTER — Encounter: Payer: Self-pay | Admitting: Pulmonary Disease

## 2022-10-04 VITALS — BP 116/60 | HR 71 | Temp 97.6°F | Ht 72.0 in | Wt 211.2 lb

## 2022-10-04 DIAGNOSIS — J4540 Moderate persistent asthma, uncomplicated: Secondary | ICD-10-CM | POA: Diagnosis not present

## 2022-10-04 DIAGNOSIS — H34232 Retinal artery branch occlusion, left eye: Secondary | ICD-10-CM | POA: Insufficient documentation

## 2022-10-04 DIAGNOSIS — I272 Pulmonary hypertension, unspecified: Secondary | ICD-10-CM | POA: Diagnosis not present

## 2022-10-04 DIAGNOSIS — H3412 Central retinal artery occlusion, left eye: Secondary | ICD-10-CM | POA: Diagnosis not present

## 2022-10-04 DIAGNOSIS — J4489 Other specified chronic obstructive pulmonary disease: Secondary | ICD-10-CM | POA: Diagnosis not present

## 2022-10-04 DIAGNOSIS — G4733 Obstructive sleep apnea (adult) (pediatric): Secondary | ICD-10-CM | POA: Diagnosis not present

## 2022-10-04 LAB — SEDIMENTATION RATE: Sed Rate: 8 mm/hr (ref 0–20)

## 2022-10-04 LAB — CBC WITH DIFFERENTIAL/PLATELET
Abs Immature Granulocytes: 0.01 10*3/uL (ref 0.00–0.07)
Basophils Absolute: 0 10*3/uL (ref 0.0–0.1)
Basophils Relative: 1 %
Eosinophils Absolute: 0.2 10*3/uL (ref 0.0–0.5)
Eosinophils Relative: 3 %
HCT: 39.8 % (ref 39.0–52.0)
Hemoglobin: 13 g/dL (ref 13.0–17.0)
Immature Granulocytes: 0 %
Lymphocytes Relative: 17 %
Lymphs Abs: 1 10*3/uL (ref 0.7–4.0)
MCH: 31.4 pg (ref 26.0–34.0)
MCHC: 32.7 g/dL (ref 30.0–36.0)
MCV: 96.1 fL (ref 80.0–100.0)
Monocytes Absolute: 0.5 10*3/uL (ref 0.1–1.0)
Monocytes Relative: 8 %
Neutro Abs: 4.3 10*3/uL (ref 1.7–7.7)
Neutrophils Relative %: 71 %
Platelets: 150 10*3/uL (ref 150–400)
RBC: 4.14 MIL/uL — ABNORMAL LOW (ref 4.22–5.81)
RDW: 12.7 % (ref 11.5–15.5)
WBC: 6 10*3/uL (ref 4.0–10.5)
nRBC: 0 % (ref 0.0–0.2)

## 2022-10-04 LAB — C-REACTIVE PROTEIN: CRP: 0.7 mg/dL (ref <1.0)

## 2022-10-04 LAB — NITRIC OXIDE: Nitric Oxide: 50

## 2022-10-04 MED ORDER — TRELEGY ELLIPTA 200-62.5-25 MCG/ACT IN AEPB: 1.0000 | INHALATION_SPRAY | Freq: Every day | RESPIRATORY_TRACT | 0 refills | Status: DC

## 2022-10-04 NOTE — Progress Notes (Signed)
Subjective:    Patient ID: [name], male    DOB: 10-21-1940, 82 y.o.   MRN: [id number] Patient Care Team: Ria Bush, MD as PCP - General (Family Medicine) Debbora Dus, [facility] as Pharmacist (Pharmacist) [name], MD as Consulting Physician (Cardiology) Tyler Pita, MD as Consulting Physician (Pulmonary Disease) Chesley Mires, MD as Consulting Physician (Pulmonary Disease)  Chief Complaint  Patient presents with   Follow-up    Better! No SOB or wheezing. Cough with clear sputum.   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 Patient was last evaluated on 29 July 2022 at that time he was having issues with recurrent "bronchitis" after surgery. He he is maintained on Trelegy Ellipta 200 which he states has helped him the most previously. He is compliant with his CPAP.  He states that he saw his ophthalmologist and that he was told he had had a "stroke" in his eye, he has not had any workup in this regard.  Not sure if it was a central retinal artery occlusion or what was noted, I do not have the ophthalmology notes.  Currently he does not endorse any fevers, chills or sweats. Since resuming Trelegy he has not had a cough of note.  No recent use of rescue inhaler.  He does need nebulizer solution to have handy in case of need.  Has not had chest pain, no lower extremity edema or calf tenderness.   CPAP compliance download shows 100% use and 30/30 days with 28 days (93%) being over 4 hours.  Median pressure between 7 to 11 cm H2O.  Residual AHI 3.6.  No evidence of Cheyne-Stokes respiration.  Review of Systems A 10 point review of systems was performed and it is as noted above otherwise negative.  Patient Active Problem List   Diagnosis Date Noted   Cervical spondylosis with radiculopathy 07/11/2022   Acute  bronchitis with COPD ([place]) 06/06/2022   Right low back pain 12/29/2021   COPD exacerbation ([place]) 11/12/2021   Bladder cancer ([place]) 11/12/2021   Acute-on-chronic kidney injury ([place]) 07/09/2021   OSA (obstructive sleep apnea)    Acute on chronic diastolic CHF (congestive heart failure) ([place])    Right leg pain 06/22/2021   Nocturnal hypoxia 06/11/2021   CKD (chronic kidney disease) stage 3, GFR 30-59 ml/min ([place]) 02/24/2021   Peripheral neuropathy 02/23/2021   Spondylolisthesis of lumbar region 07/07/2020   COPD (chronic obstructive pulmonary disease) ([place]) 01/14/2020   Coccydynia 09/20/2019   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   Anxiety 06/04/2019   Chronic right shoulder pain 09/10/2018   Cervical stenosis of spinal canal 06/05/2017   Pedal edema 06/05/2017   Health maintenance examination 12/06/2016   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/13/2016   Overweight (BMI 25.0-29.9) 07/04/2016   Low vitamin B12 level 01/01/2016   Exertional dyspnea 07/07/2015   Medicare annual wellness visit, subsequent 07/03/2015   Advanced care planning/counseling discussion 07/03/2015   Acute recurrent frontal sinusitis 05/05/2015   Type 2 diabetes mellitus with other specified complication ([place]) [id number]   Pleuritic chest pain 05/04/2015   Ex-smoker 05/04/2015   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   Spermatocele 04/02/2013   Syncopal vertigo 11/04/2010   Lumbar disc disease with radiculopathy 11/04/2010   CAD, ARTERY BYPASS GRAFT 08/11/2009   PAD (peripheral artery disease) ([place]) 08/11/2009   DUPUYTREN'S CONTRACTURE, 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  Carotid stenosis 07/09/2008   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   Benign prostatic hyperplasia with urinary obstruction 09/06/2007   Essential hypertension 04/30/2007   GERD 04/30/2007   Hyperlipidemia 04/26/2007   OA (osteoarthritis) of knee 04/26/2007   DVT, HX OF 04/26/2007   Social History   Tobacco Use   Smoking status:  Former    Packs/day: 1.00    Years: 25.00    Total pack years: 25.00    Types: Cigarettes    Quit date: 08/15/1988    Years since quitting: 34.1    Passive exposure: Past   Smokeless tobacco: Never  Substance Use Topics   Alcohol use: No    Alcohol/week: 0.0 standard drinks of alcohol   Allergies  Allergen Reactions   Vioxx [Rofecoxib] Other (See Comments)    Hemorrhage    Spiriva Respimat [Tiotropium Bromide Monohydrate] Other (See Comments)    Elevated bp   Contrast Media [Iodinated Contrast Media] Itching and Rash    Delayed reaction post abdominal aortagram.    Morphine Nausea Only and Other (See Comments)    Irritability    Current Meds  Medication Sig   albuterol (PROVENTIL) (2.5 MG/3ML) 0.083% nebulizer solution Take 3 mLs (2.5 mg total) by nebulization every 6 (six) hours as needed for wheezing or shortness of breath.   aspirin EC 81 MG EC tablet Take 1 tablet (81 mg total) by mouth daily.   atorvastatin (LIPITOR) 20 MG tablet TAKE 1 TABLET BY MOUTH DAILY.   Azelastine HCl (ASTEPRO) 0.15 % SOLN Place 1 spray into the nose at bedtime as needed (Sinus congestion, allergies).   cyclobenzaprine (FLEXERIL) 5 MG tablet Take 1 tablet (5 mg total) by mouth 3 (three) times daily as needed for muscle spasms.   docusate sodium (COLACE) 100 MG capsule Take 1 capsule (100 mg total) by mouth 2 (two) times daily.   ezetimibe (ZETIA) 10 MG tablet Take 1 tablet (10 mg total) by mouth daily.   famotidine (PEPCID) 20 MG tablet Take 20 mg by mouth daily.   fluticasone (FLONASE) 50 MCG/ACT nasal spray Place 1 spray into both nostrils daily as needed for allergies or rhinitis.   Fluticasone-Umeclidin-Vilant (TRELEGY ELLIPTA) 200-62.5-25 MCG/ACT AEPB Inhale 1 puff into the lungs daily.   furosemide (LASIX) 20 MG tablet Take 1 tablet by mouth every third day as directed.   gabapentin (NEURONTIN) 600 MG tablet Take 600 mg by mouth 3 (three) times daily.   HYDROcodone-acetaminophen (NORCO/VICODIN)  5-325 MG tablet Take 1-2 tablets by mouth every 4 (four) hours as needed for moderate pain.   isosorbide mononitrate (IMDUR) 30 MG 24 hr tablet Take 1 tablet (30 mg total) by mouth daily.   losartan (COZAAR) 25 MG tablet TAKE 1 TABLET BY MOUTH DAILY   nitroGLYCERIN (NITROSTAT) 0.4 MG SL tablet Place 1 tablet (0.4 mg total) under the tongue every 5 (five) minutes as needed for chest pain.   polyethylene glycol (MIRALAX / GLYCOLAX) 17 g packet Take 17 g by mouth daily as needed for mild constipation.    PROAIR HFA 108 (90 Base) MCG/ACT inhaler TAKE 2 PUFFS INTO LUNGS EVERY 6 HOURS ASNEEDED FOR WHEEZING OR SHORTNESS OF BREATH   ranolazine (RANEXA) 500 MG 12 hr tablet TAKE ONE (1) TABLET BY MOUTH TWO TIMES PER DAY   tamsulosin (FLOMAX) 0.4 MG CAPS capsule TAKE 1 CAPSULE BY MOUTH AT BEDTIME   Immunization History  Administered Date(s) Administered   Fluad Quad(high Dose 65+) 04/17/2019   Influenza Whole 08/15/2000, 07/04/2007, 05/09/2008, 05/31/2010  Influenza, High Dose Seasonal PF 05/15/2013, 06/25/2015, 07/05/2021   Influenza,inj,Quad PF,6+ Mos 04/17/2014, 05/18/2016, 06/07/2018   Influenza-Unspecified 05/22/2017, 05/28/2020   PFIZER(Purple Top)SARS-COV-2 Vaccination 09/07/2019, 09/28/2019, 05/12/2020, 01/19/2021   Pneumococcal Conjugate-13 02/09/2015   Pneumococcal Polysaccharide-23 08/15/2000, 10/29/2008   Td 08/15/2005   Tdap 02/10/2018   Zoster Recombinat (Shingrix) 04/05/2017, 07/25/2017   Zoster, Live 02/04/2009      Objective:   Physical Exam BP 116/60 (BP Location: Left Arm, Cuff Size: Large)   Pulse 71   Temp 97.6 F (36.4 C)   Ht 6' (1.829 m)   Wt 211 lb 3.2 oz (95.8 kg)   SpO2 99%   BMI 28.64 kg/m   SpO2: 99 % O2 Device: None (Room air)  GENERAL: This is a well-developed, overweight gentleman, chronically ill-appearing, awake, alert, no acute distress.  Fully ambulatory. HEAD: Normocephalic, atraumatic.  EYES: Pupils equal, round, reactive to light.  No scleral  icterus.  MOUTH: Nose/mouth/throat not examined due to masking requirements per hospital guidelines. NECK: Supple. No thyromegaly. Trachea midline. No JVD.  No adenopathy.  Anterior cervical fusion incision, healed. PULMONARY: Good air entry bilaterally, coarse breath sounds otherwise, no adventitious sounds.  CARDIOVASCULAR: S1 and S2. Regular rate and rhythm.  Grade 1/6 systolic ejection murmur left sternal border. GASTROINTESTINAL: Protuberant abdomen, soft, no tenderness.   MUSCULOSKELETAL: No joint deformity, no clubbing, no edema.  NEUROLOGIC: No overt focal deficits noted.  Speech is fluent.   SKIN: Intact,warm,dry.  Limited exam shows no rashes.  He has multiple purpura and ecchymoses in the upper extremities. PSYCH: Mood and behavior normal.  Lab Results  Component Value Date   NITRICOXIDE 50 10/04/2022       Assessment & Plan:     ICD-10-CM   1. 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 COPD with asthma  J44.89    Predominant component is asthma Continue Trelegy, continue as needed albuterol    3. OSA (obstructive sleep apnea)  G47.33    Compliant with CPAP Continue use    4. Pulmonary hypertension (HCC) - mild/moderate  I27.20 ECHOCARDIOGRAM COMPLETE   Recheck 2D echo    5. CRAO (central retinal artery occlusion), left  H34.12 ANA,IFA RA Diag Pnl w/rflx Tit/Patn    US Carotid Bilateral    ECHOCARDIOGRAM COMPLETE    Sed Rate (ESR)    C-reactive protein   Initiated workup, blood work ordered Carotid duplex Echocardiogram Will discuss with primary     Orders Placed This Encounter  Procedures   US Carotid Bilateral    Standing Status:   Future    Standing Expiration Date:   10/05/2023    Order Specific Question:   Reason for  Exam (SYMPTOM  OR DIAGNOSIS REQUIRED)    Answer:   Central retinal artery occlusion, stroke    Order Specific Question:   Preferred imaging location?    Answer:   [facility]   CBC w/Diff    Standing Status:   Future    Standing Expiration Date:   10/05/2023   Allergen Panel (27) + IGE    Standing Status:   Future    Standing Expiration Date:   10/05/2023   ANA,IFA RA Diag Pnl w/rflx Tit/Patn    Standing Status:   Future  Standing Expiration Date:   10/05/2023   Sed Rate (ESR)    Standing Status:   Future    Standing Expiration Date:   10/04/2023   C-reactive protein    Standing Status:   Future    Standing Expiration Date:   10/04/2023   Nitric oxide   ECHOCARDIOGRAM COMPLETE    Standing Status:   Future    Standing Expiration Date:   10/05/2023    Order Specific Question:   Where should this test be performed    Answer:   [facility]    Order Specific Question:   Perflutren DEFINITY (image enhancing agent) should be administered unless hypersensitivity or allergy exist    Answer:   Administer Perflutren    Order Specific Question:   Is a special reader required? (athlete or structural heart)    Answer:   No    Order Specific Question:   Does this study need to be read by the Structural team/Level 3 readers?    Answer:   No    Order Specific Question:   Reason for exam-Echo    Answer:   Stroke  I63.9   Meds ordered this encounter  Medications   Fluticasone-Umeclidin-Vilant (TRELEGY ELLIPTA) 200-62.5-25 MCG/ACT AEPB    Sig: Inhale 1 puff into the lungs daily.    Dispense:  14 each    Refill:  0    Order Specific Question:   Lot Number?    Answer:   [id number]    Order Specific Question:   Expiration Date?    Answer:   01/14/2024    Order Specific Question:   Manufacturer?    Answer:   [employer] [12]    Order Specific Question:   Quantity    Answer:   2   From the pulmonary standpoint he appears fairly well-controlled though not optimally.  He will likely need  biologic for control of his asthma.   I have communicated with patient's primary MD and cardiologist with regards to the issues of the potential central retinal artery occlusion (by the patient's description) and workup that has been initiated so that he can have appropriate follow-up.  Will see the patient follow-up in 2 to 3 months time he is to call sooner should any new problems arise.  Renold Don, MD Advanced Bronchoscopy PCCM [facility]    *This note was dictated using voice recognition software/Dragon.  Despite best efforts to proofread, errors can occur which can change the meaning. Any transcriptional errors that result from this process are unintentional and may not be fully corrected at the time of dictation.

## 2022-10-04 NOTE — Addendum Note (Signed)
Addended by: Antonieta Iba C on: 10/04/2022 11:04 AM   Modules accepted: Orders

## 2022-10-04 NOTE — Telephone Encounter (Signed)
Patient was given [place] assistance paperwork while in the office. He will fill the forms out and return them to our office.

## 2022-10-04 NOTE — Patient Instructions (Signed)
We have put in some blood work to evaluate the issue you have had with the stroke as well as the issues with your asthma.  We also put an order for ultrasounds of your carotid arteries and also an echocardiogram.  We have given you samples of Trelegy.  We have given you the form to get some assistance from the company that makes Trelegy.  We will see you in follow-up in 2 to 3 months time call sooner should any new problems arise.

## 2022-10-04 NOTE — Telephone Encounter (Signed)
Received message from Dr Patsey Berthold about vision issues he's been having.  I'm glad she's started workup with carotid ultrasound and heart ultrasound.  I'd like him to schedule OV to review this as well as we're due for 6 month f/u visit from last physical.

## 2022-10-04 NOTE — Addendum Note (Signed)
Addended by: Antonieta Iba C on: 10/04/2022 11:03 AM   Modules accepted: Orders

## 2022-10-05 LAB — ANTINUCLEAR ANTIBODIES, IFA: ANA Ab, IFA: NEGATIVE

## 2022-10-05 NOTE — Telephone Encounter (Signed)
Patient notified as instructed by telephone and verbalized understanding. Patient scheduled for an office visit with Dr. Danise Mina 10/07/22 at 11:00 am.

## 2022-10-07 ENCOUNTER — Ambulatory Visit (INDEPENDENT_AMBULATORY_CARE_PROVIDER_SITE_OTHER): Payer: HMO | Admitting: Family Medicine

## 2022-10-07 ENCOUNTER — Encounter: Payer: Self-pay | Admitting: Family Medicine

## 2022-10-07 VITALS — BP 126/64 | HR 67 | Temp 97.6°F | Ht 72.0 in | Wt 210.4 lb

## 2022-10-07 DIAGNOSIS — J4489 Other specified chronic obstructive pulmonary disease: Secondary | ICD-10-CM

## 2022-10-07 DIAGNOSIS — G6289 Other specified polyneuropathies: Secondary | ICD-10-CM | POA: Diagnosis not present

## 2022-10-07 DIAGNOSIS — E785 Hyperlipidemia, unspecified: Secondary | ICD-10-CM

## 2022-10-07 DIAGNOSIS — E1169 Type 2 diabetes mellitus with other specified complication: Secondary | ICD-10-CM

## 2022-10-07 DIAGNOSIS — M4802 Spinal stenosis, cervical region: Secondary | ICD-10-CM

## 2022-10-07 DIAGNOSIS — H34232 Retinal artery branch occlusion, left eye: Secondary | ICD-10-CM

## 2022-10-07 DIAGNOSIS — E782 Mixed hyperlipidemia: Secondary | ICD-10-CM | POA: Diagnosis not present

## 2022-10-07 LAB — ALLERGEN PANEL (27) + IGE
Alternaria Alternata IgE: <0.1 kU/L
Aspergillus Fumigatus IgE: <0.1 kU/L
Bahia Grass IgE: <0.1 kU/L
Bermuda Grass IgE: <0.1 kU/L
Cat Dander IgE: <0.1 kU/L
Cedar, Mountain IgE: <0.1 kU/L
Cladosporium Herbarum IgE: <0.1 kU/L
Cocklebur IgE: <0.1 kU/L
Cockroach, American IgE: <0.1 kU/L
Common Silver Birch IgE: <0.1 kU/L
D Farinae IgE: <0.1 kU/L
D Pteronyssinus IgE: <0.1 kU/L
Dog Dander IgE: <0.1 kU/L
Elm, American IgE: <0.1 kU/L
Hickory, White IgE: <0.1 kU/L
IgE (Immunoglobulin E), Serum: 70 IU/mL (ref 6–495)
Johnson Grass IgE: <0.1 kU/L
Kentucky Bluegrass IgE: 1.5 kU/L — AB
Maple/Box Elder IgE: <0.1 kU/L
Mucor Racemosus IgE: <0.1 kU/L
Oak, White IgE: <0.1 kU/L
Penicillium Chrysogen IgE: <0.1 kU/L
Pigweed, Rough IgE: <0.1 kU/L
Plantain, English IgE: <0.1 kU/L
Ragweed, Short IgE: <0.1 kU/L
Setomelanomma Rostrat: <0.1 kU/L
Timothy Grass IgE: 1.37 kU/L — AB
White Mulberry IgE: <0.1 kU/L

## 2022-10-07 LAB — POCT GLYCOSYLATED HEMOGLOBIN (HGB A1C): Hemoglobin A1C: 6.1 % — AB (ref 4.0–5.6)

## 2022-10-07 LAB — COMPREHENSIVE METABOLIC PANEL
ALT: 14 U/L (ref 0–53)
AST: 15 U/L (ref 0–37)
Albumin: 4.2 g/dL (ref 3.5–5.2)
Alkaline Phosphatase: 46 U/L (ref 39–117)
BUN: 25 mg/dL — ABNORMAL HIGH (ref 6–23)
CO2: 28 mEq/L (ref 19–32)
Calcium: 9.1 mg/dL (ref 8.4–10.5)
Chloride: 104 mEq/L (ref 96–112)
Creatinine, Ser: 1.28 mg/dL (ref 0.40–1.50)
GFR: 52.48 mL/min — ABNORMAL LOW (ref >60.00)
Glucose, Bld: 103 mg/dL — ABNORMAL HIGH (ref 70–99)
Potassium: 4.6 mEq/L (ref 3.5–5.1)
Sodium: 140 mEq/L (ref 135–145)
Total Bilirubin: 0.6 mg/dL (ref 0.2–1.2)
Total Protein: 6.1 g/dL (ref 6.0–8.3)

## 2022-10-07 LAB — LIPID PANEL
Cholesterol: 105 mg/dL (ref 0–200)
HDL: 41.3 mg/dL (ref >39.00)
LDL Cholesterol: 49 mg/dL (ref 0–99)
NonHDL: 63.5
Total CHOL/HDL Ratio: 3
Triglycerides: 73 mg/dL (ref 0.0–149.0)
VLDL: 14.6 mg/dL (ref 0.0–40.0)

## 2022-10-07 MED ORDER — GABAPENTIN 600 MG PO TABS: 600.0000 mg | ORAL_TABLET | Freq: Three times a day (TID) | ORAL | 1 refills | Status: DC

## 2022-10-07 NOTE — Patient Instructions (Addendum)
Labs today  Get RSV shot at local pharmacy  Gabapentin prescription printed out today.  We will await heart ultrasound and carotid artery ultrasound results. If all normal, return to see Dr Rockey Situ for follow up visit.  Good to see you today. Continue current medicines.

## 2022-10-07 NOTE — Progress Notes (Unsigned)
Patient ID: [name], male    DOB: 1940-09-29, 82 y.o.   MRN: [id number]  This visit was conducted in person.  BP 126/64   Pulse 67   Temp 97.6 F (36.4 C) (Temporal)   Ht 6' (1.829 m)   Wt 210 lb 6 oz (95.4 kg)   SpO2 98%   BMI 28.53 kg/m    CC: 6 mo f/u visit  Subjective:   HPI: [name] is a 82 y.o. male presenting on 10/07/2022 for Medical Management of Chronic Issues (Here for 6 mo f/u.)   Recent cervical fusion [name]) 06/2022. Slowed recovery.   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 He denies recent stroke symptoms such as slurred speech or unilateral numbness/weakness, facial droop. He continues taking atorvastatin, zetia, and aspirin daily.  He notes R>L occipital HA, ?TTH. Notes neuropathy can lead to unsteadiness.  Saw pulm Dr Patsey Berthold earlier in the week for mod COPD with asthma overlap and OSA on CPAP with mild pulm HTN - she ordered labwork including normal CRP, ESR, ANA, stable CBC. Pending echocardiogram and carotid US for further evaluation of above branch retinal artery occlusion.   For asthma > COPD - he continues trelegy ellipta 200 regularly with benefit.   DM - does regularly check sugars fasting in am, all <130. Compliant with antihyperglycemic regimen which includes: diet controlled. Denies low sugars or hypoglycemic symptoms. Denies paresthesias, blurry vision. Last diabetic eye exam 09/14/2022. Glucometer brand: unsure. Last foot exam: today. DSME: ?. Lab Results  Component Value Date   HGBA1C 6.1 (A) 10/07/2022   Diabetic Foot Exam - Simple   No data filed   - declined as he just had podiatry appt last month.  Lab Results  Component Value Date   MICROALBUR <0.7 02/18/2022        Relevant past medical, surgical, family and social history reviewed and  updated as indicated. Interim medical history since our last visit reviewed. Allergies and medications reviewed and updated. Outpatient Medications Prior to Visit  Medication Sig Dispense Refill   albuterol (PROVENTIL) (2.5 MG/3ML) 0.083% nebulizer solution Take 3 mLs (2.5 mg total) by nebulization every 6 (six) hours as needed for wheezing or shortness of breath. 75 mL 12   aspirin EC 81 MG EC tablet Take 1 tablet (81 mg total) by mouth daily. 30 tablet 0   atorvastatin (LIPITOR) 20 MG tablet TAKE 1 TABLET BY MOUTH DAILY. 90 tablet 3   Azelastine HCl (ASTEPRO) 0.15 % SOLN Place 1 spray into the nose at bedtime as needed (Sinus congestion, allergies). 30 mL 11   cyclobenzaprine (FLEXERIL) 5 MG tablet Take 1 tablet (5 mg total) by mouth 3 (three) times daily as needed for muscle spasms. 30 tablet 0   docusate sodium (COLACE) 100 MG capsule Take 1 capsule (100 mg total) by mouth 2 (two) times daily. 30 capsule 0   doxycycline (VIBRA-TABS) 100 MG tablet Take 1 tablet (100 mg total) by mouth 2 (two) times daily. 20 tablet 0   ezetimibe (ZETIA) 10 MG tablet Take 1 tablet (10 mg total) by mouth daily. 90 tablet 3   famotidine (PEPCID) 20 MG tablet Take 20 mg by mouth daily.     fluticasone (FLONASE) 50 MCG/ACT nasal spray Place 1 spray into both nostrils daily as needed for allergies or rhinitis.  Fluticasone-Umeclidin-Vilant (TRELEGY ELLIPTA) 200-62.5-25 MCG/ACT AEPB Inhale 1 puff into the lungs daily. 180 each 1   Fluticasone-Umeclidin-Vilant (TRELEGY ELLIPTA) 200-62.5-25 MCG/ACT AEPB Inhale 1 puff into the lungs daily. 14 each 0   furosemide (LASIX) 20 MG tablet Take 1 tablet by mouth every third day as directed. 30 tablet 2   guaiFENesin-codeine (ROBITUSSIN AC) 100-10 MG/5ML syrup Take 5 mLs by mouth 3 (three) times daily as needed for cough. 120 mL 0   HYDROcodone-acetaminophen (NORCO/VICODIN) 5-325 MG tablet Take 1-2 tablets by mouth every 4 (four) hours as needed for moderate pain. 30 tablet 0    isosorbide mononitrate (IMDUR) 30 MG 24 hr tablet Take 1 tablet (30 mg total) by mouth daily. 90 tablet 3   losartan (COZAAR) 25 MG tablet TAKE 1 TABLET BY MOUTH DAILY 90 tablet 2   nitroGLYCERIN (NITROSTAT) 0.4 MG SL tablet Place 1 tablet (0.4 mg total) under the tongue every 5 (five) minutes as needed for chest pain. 25 tablet 3   polyethylene glycol (MIRALAX / GLYCOLAX) 17 g packet Take 17 g by mouth daily as needed for mild constipation.      PROAIR HFA 108 (90 Base) MCG/ACT inhaler TAKE 2 PUFFS INTO LUNGS EVERY 6 HOURS ASNEEDED FOR WHEEZING OR SHORTNESS OF BREATH 8.5 g 6   ranolazine (RANEXA) 500 MG 12 hr tablet TAKE ONE (1) TABLET BY MOUTH TWO TIMES PER DAY 180 tablet 3   tamsulosin (FLOMAX) 0.4 MG CAPS capsule TAKE 1 CAPSULE BY MOUTH AT BEDTIME 90 capsule 1   gabapentin (NEURONTIN) 600 MG tablet Take 600 mg by mouth 3 (three) times daily.     No facility-administered medications prior to visit.     Per HPI unless specifically indicated in ROS section below Review of Systems  Objective:  BP 126/64   Pulse 67   Temp 97.6 F (36.4 C) (Temporal)   Ht 6' (1.829 m)   Wt 210 lb 6 oz (95.4 kg)   SpO2 98%   BMI 28.53 kg/m   Wt Readings from Last 3 Encounters:  10/07/22 210 lb 6 oz (95.4 kg)  10/04/22 211 lb 3.2 oz (95.8 kg)  08/05/22 212 lb 8 oz (96.4 kg)      Physical Exam Vitals and nursing note reviewed.  Constitutional:      Appearance: Normal appearance. He is not ill-appearing.  HENT:     Head: Normocephalic and atraumatic.     Mouth/Throat:     Mouth: Mucous membranes are moist.     Pharynx: Oropharynx is clear. No oropharyngeal exudate or posterior oropharyngeal erythema.  Eyes:     Extraocular Movements: Extraocular movements intact.     Conjunctiva/sclera: Conjunctivae normal.     Pupils: Pupils are equal, round, and reactive to light.  Neck:     Vascular: No carotid bruit.  Cardiovascular:     Rate and Rhythm: Normal rate and regular rhythm.     Pulses:  Normal pulses.     Heart sounds: Normal heart sounds. No murmur heard. Pulmonary:     Effort: Pulmonary effort is normal. No respiratory distress.     Breath sounds: Normal breath sounds. No wheezing, rhonchi or rales.  Musculoskeletal:     Cervical back: Normal range of motion and neck supple.     Right lower leg: No edema.     Left lower leg: No edema.  Skin:    General: Skin is warm and dry.     Capillary Refill: Capillary refill takes less than 2 seconds.  Findings: No rash.  Neurological:     General: No focal deficit present.     Mental Status: He is alert.     Cranial Nerves: Cranial nerves 2-12 are intact.     Sensory: Sensation is intact.     Motor: Motor function is intact.     Coordination: Coordination is intact. Romberg sign negative.     Gait: Gait is intact.     Comments:  CN 2-12 intact FTN intact EOMI No pronator drift  Psychiatric:        Mood and Affect: Mood normal.        Behavior: Behavior normal.       Results for orders placed or performed in visit on 10/07/22  POCT glycosylated hemoglobin (Hb A1C)  Result Value Ref Range   Hemoglobin A1C 6.1 (A) 4.0 - 5.6 %   HbA1c POC (<> result, manual entry)     HbA1c, POC (prediabetic range)     HbA1c, POC (controlled diabetic range)      Assessment & Plan:   Problem List Items Addressed This Visit     Hyperlipidemia   Relevant Orders   Comprehensive metabolic panel   Lipid panel   Type 2 diabetes mellitus with other specified complication (HCC)   Relevant Orders   POCT glycosylated hemoglobin (Hb A1C) (Completed)   Retinal artery occlusion, branch, left - Primary     Meds ordered this encounter  Medications   DISCONTD: gabapentin (NEURONTIN) 600 MG tablet    Sig: Take 1 tablet (600 mg total) by mouth 3 (three) times daily.    Dispense:  270 tablet    Refill:  1   DISCONTD: gabapentin (NEURONTIN) 600 MG tablet    Sig: Take 1 tablet (600 mg total) by mouth 3 (three) times daily.     Dispense:  270 tablet    Refill:  1   gabapentin (NEURONTIN) 600 MG tablet    Sig: Take 1 tablet (600 mg total) by mouth 3 (three) times daily.    Dispense:  270 tablet    Refill:  1    Orders Placed This Encounter  Procedures   Comprehensive metabolic panel   Lipid panel   POCT glycosylated hemoglobin (Hb A1C)    Patient Instructions  Labs today  Get RSV shot at local pharmacy  Gabapentin prescription printed out today.  We will await heart ultrasound and carotid artery ultrasound results. If all normal, return to see Dr Rockey Situ for follow up visit.  Good to see you today. Continue current medicines.  Follow up plan: Return in about 5 months (around 03/07/2023), or if symptoms worsen or fail to improve, for annual exam, prior fasting for blood work, medicare wellness visit.  Ria Bush, MD

## 2022-10-08 ENCOUNTER — Encounter: Payer: Self-pay | Admitting: Family Medicine

## 2022-10-08 NOTE — Assessment & Plan Note (Signed)
S/p cervical fusion [name]) 06/2022 - slow recovery.

## 2022-10-08 NOTE — Assessment & Plan Note (Signed)
Chronic, stable, diet controlled.

## 2022-10-08 NOTE — Assessment & Plan Note (Signed)
Chronic, continues zetia '10mg'$  and atorvastatin '20mg'$ . Update FLP and low threshold to increase statin in recent branch retinal artery occlusion.  The ASCVD Risk score (Arnett DK, et al., 2019) failed to calculate for the following reasons:   The 2019 ASCVD risk score is only valid for ages 51 to 24

## 2022-10-08 NOTE — Assessment & Plan Note (Addendum)
Noted by ophtho Dr George Ina on routine eye exam 09/2022 - pending echocardiogram and carotid US ordered by pulm Dr Patsey Berthold. Reviewed recent mini stroke with patient. Will update labwork today including FLP, A1c. He already takes aspirin '81mg'$  daily. Low threshold to increase statin dose (currently on atorvastatin '20mg'$  + ezetimibe '10mg'$  daily)

## 2022-10-08 NOTE — Assessment & Plan Note (Signed)
Gabapentin '600mg'$  TID refilled.

## 2022-10-08 NOTE — Assessment & Plan Note (Signed)
Appreciate pulm care - he is maintaining on Trelegy Ellipta 269mg 1 puff daily with PRN albuterol.

## 2022-10-11 NOTE — Telephone Encounter (Addendum)
I spoke with the patient. He said he was having a hard time understanding the paperwork and what to fill out. He would like to stop by the office tomorrow after his Ultrasound to have [place] go over it with him.

## 2022-10-12 ENCOUNTER — Ambulatory Visit
Admission: RE | Admit: 2022-10-12 | Discharge: 2022-10-12 | Disposition: A | Discharge status: Home / Self Care | Payer: HMO | Source: Ambulatory Visit | Attending: Pulmonary Disease | Admitting: Pulmonary Disease

## 2022-10-12 DIAGNOSIS — I6523 Occlusion and stenosis of bilateral carotid arteries: Secondary | ICD-10-CM | POA: Diagnosis not present

## 2022-10-12 DIAGNOSIS — H3412 Central retinal artery occlusion, left eye: Secondary | ICD-10-CM | POA: Diagnosis not present

## 2022-10-12 NOTE — Telephone Encounter (Signed)
The patient came in the office today. I went over the paperwork with him. He said he does not know if he has paid $600 out of pocket this year yet or not. He said he will go to the pharmacy and see. If he has he will bring the form back if not he will bring it back when he has reached the $600 out of pocket.   Nothing further needed.

## 2022-10-13 ENCOUNTER — Telehealth: Payer: Self-pay | Admitting: Pulmonary Disease

## 2022-10-13 NOTE — Telephone Encounter (Signed)
Pt is asking for notes to be sent to dr.Jenkins in [place].

## 2022-10-13 NOTE — Telephone Encounter (Signed)
I spoke with the patient. He asked for me to send his last office note to his Neurosurgeon, Dr. [name].  I have routed his note to Dr. [name] through [place] and notified the patient.  Nothing further needed.

## 2022-10-16 DIAGNOSIS — I10 Essential (primary) hypertension: Secondary | ICD-10-CM | POA: Diagnosis not present

## 2022-10-16 DIAGNOSIS — R0683 Snoring: Secondary | ICD-10-CM | POA: Diagnosis not present

## 2022-10-16 DIAGNOSIS — G4733 Obstructive sleep apnea (adult) (pediatric): Secondary | ICD-10-CM | POA: Diagnosis not present

## 2022-10-18 DIAGNOSIS — H6982 Other specified disorders of Eustachian tube, left ear: Secondary | ICD-10-CM | POA: Diagnosis not present

## 2022-10-20 DIAGNOSIS — B880 Other acariasis: Secondary | ICD-10-CM | POA: Diagnosis not present

## 2022-10-20 DIAGNOSIS — L239 Allergic contact dermatitis, unspecified cause: Secondary | ICD-10-CM | POA: Diagnosis not present

## 2022-10-21 ENCOUNTER — Encounter: Payer: Self-pay | Admitting: Pulmonary Disease

## 2022-10-24 ENCOUNTER — Telehealth: Payer: Self-pay | Admitting: Pulmonary Disease

## 2022-10-24 NOTE — Telephone Encounter (Signed)
Spoke to [place] [facility]. Ebony Hail stated that patient is requesting refill on Duoneb. Ebony Hail is aware that patient should be taking plain albuterol solution only since he is also taking trelegy. She stated that albuterol solution has refills and she will fill Rx. Patient is aware and voiced his understanding.  Nothing further needed.

## 2022-10-24 NOTE — Telephone Encounter (Signed)
Patient called stating [facility] denied refill request on albuterol nebulizer solution. Informed patient he had 12 refills from 07/2022. Patient states we need to fax the script to [facility]. Attempted to call pharmacy, but no one was available. Please advise.

## 2022-10-25 ENCOUNTER — Other Ambulatory Visit: Payer: Self-pay | Admitting: Family Medicine

## 2022-10-25 DIAGNOSIS — J4489 Other specified chronic obstructive pulmonary disease: Secondary | ICD-10-CM

## 2022-10-25 NOTE — Telephone Encounter (Signed)
Albuterol inh Last filled:  10/20/21, #8.5g Last OV:  10/07/22, 6 mo f/u Next OV:  03/07/23, CPE

## 2022-10-26 ENCOUNTER — Ambulatory Visit (INDEPENDENT_AMBULATORY_CARE_PROVIDER_SITE_OTHER): Payer: HMO | Admitting: Urology

## 2022-10-26 VITALS — BP 128/72 | HR 77 | Ht 72.0 in | Wt 210.0 lb

## 2022-10-26 DIAGNOSIS — C679 Malignant neoplasm of bladder, unspecified: Secondary | ICD-10-CM

## 2022-10-26 DIAGNOSIS — Z8551 Personal history of malignant neoplasm of bladder: Secondary | ICD-10-CM | POA: Diagnosis not present

## 2022-10-26 DIAGNOSIS — D494 Neoplasm of unspecified behavior of bladder: Secondary | ICD-10-CM | POA: Diagnosis not present

## 2022-10-26 DIAGNOSIS — R3915 Urgency of urination: Secondary | ICD-10-CM | POA: Diagnosis not present

## 2022-10-26 LAB — URINALYSIS, COMPLETE
Bilirubin, UA: NEGATIVE
Glucose, UA: NEGATIVE
Ketones, UA: NEGATIVE
Leukocytes,UA: NEGATIVE
Nitrite, UA: NEGATIVE
Protein,UA: NEGATIVE
RBC, UA: NEGATIVE
Specific Gravity, UA: 1.01 (ref 1.005–1.030)
Urobilinogen, Ur: 0.2 mg/dL (ref 0.2–1.0)
pH, UA: 5 (ref 5.0–7.5)

## 2022-10-26 LAB — MICROSCOPIC EXAMINATION

## 2022-10-26 MED ORDER — SILODOSIN 8 MG PO CAPS: 8.0000 mg | ORAL_CAPSULE | Freq: Every day | ORAL | 0 refills | Status: DC

## 2022-10-26 NOTE — Progress Notes (Signed)
   10/26/22  CC:  Chief Complaint  Patient presents with   Cysto   Urologic history: 1.  Ta high-grade urothelial carcinoma bladder Incidentally noted on pelvic MRI ordered by orthopedics to have a 15 x 20 mm bladder mass near right UO; cystoscopy with 3 cm papillary tumor TURBT 10/19/2025 with high-grade tumor and a low-grade tumor left posterolateral wall Induction BCG x6 completed 01/20/2022 Maintenance BCG x 3: 05/23/2022 MR urogram 03/2022 showed no upper tract abnormalities  2.  BPH with LUTS Cystoscopy moderate lateral lobe enlargement/bladder neck elevation On tamsulosin   HPI: Complaining of urinary urgency today.  On tamsulosin.  Had no improvement with Gemtesa.  Rx trospium sent however was too expensive and he started back on tolterodine  Blood pressure 135/74, pulse 90, height 6' (1.829 m), weight 210 lb (95.3 kg).   Cystoscopy Procedure Note  Patient identification was confirmed, informed consent was obtained, and patient was prepped using Betadine solution.  Lidocaine jelly was administered per urethral meatus.     Pre-Procedure: - Inspection reveals a normal caliber urethral meatus.  Procedure: The flexible cystoscope was introduced without difficulty - No urethral strictures/lesions are present. - Prostate moderate lateral lobe enlargement -  Moderate elevation  bladder neck - Bilateral ureteral orifices identified - Bladder mucosa  reveals no ulcers, tumors, or lesions -Flat area early papillary change lateral to right UO - No bladder stones - Mild-moderate trabeculation  Retroflexion shows no abnormalities/tumor   Post-Procedure: - Patient tolerated the procedure well  Assessment/ Plan: Recurrent tumor lateral to right UO Findings were discussed and recommend scheduling TURBT We again discussed the procedure and potential risk of bleeding, infection and bladder injury.  All questions were answered and he desires to schedule     Abbie Sons,  MD

## 2022-10-27 ENCOUNTER — Telehealth: Payer: Self-pay | Admitting: Pulmonary Disease

## 2022-10-27 NOTE — Telephone Encounter (Signed)
Patient is aware that Dr. Danise Mina sent in albuterol HFA yesterday. He will go by pharmacy.  Nothing further needed.

## 2022-10-27 NOTE — Telephone Encounter (Signed)
[facility] states [facility] has put in an inhaler refill request 3 days ago. He is getting ready to go out of town

## 2022-10-28 ENCOUNTER — Other Ambulatory Visit: Payer: Self-pay | Admitting: Urology

## 2022-10-28 DIAGNOSIS — D494 Neoplasm of unspecified behavior of bladder: Secondary | ICD-10-CM

## 2022-10-28 NOTE — Progress Notes (Unsigned)
Surgical Physician Order Form [facility]  * Scheduling expectation :  Patient preference  *Length of Case: 45 minutes  *Clearance needed: Per Gaspar Bidding  *Anticoagulation Instructions: [name]  *Aspirin Instructions: Ok to continue Aspirin  *Post-op visit Date/Instructions:  1-2 week with pathology review  *Diagnosis: Bladder Tumor  *Procedure:  TURBT 2-5cm [id number])   Additional orders: Gemcitabine 2000mg  bladder instillation  -Admit type: OUTpatient  -Anesthesia: Choice  -VTE Prophylaxis Standing Order SCD's       Other:   -Standing Lab Orders Per Anesthesia    Lab other: UA&Urine Culture  -Standing Test orders EKG/Chest x-ray per Anesthesia       Test other:   - Medications:  Ancef 2gm IV  -Other orders:  [name]

## 2022-10-28 NOTE — Addendum Note (Signed)
Addended by: Abbie Sons on: 10/28/2022 03:21 PM   Modules accepted: Level of Service

## 2022-10-31 ENCOUNTER — Telehealth: Payer: Self-pay

## 2022-10-31 ENCOUNTER — Telehealth: Payer: Self-pay | Admitting: Urgent Care

## 2022-10-31 NOTE — Progress Notes (Signed)
  Perioperative Services Pre-Admission/Anesthesia Testing     Date: 10/31/22  Name: [name] MRN:   [id number]  Re: Request from surgery for clearance prior to scheduled procedure  Patient is scheduled to undergo a TRANSURETHRAL RESECTION OF BLADDER TUMOR (TURBT); BLADDER INSTILLATION OF GEMCITABINE on 11/15/2022 with Dr. Fannie Knee, MD. Patient has not been scheduled for his PAT appointment at this point, thus has not undergone review by PAT RN and/or APP. Received communication from primary attending surgeon's office requesting that patient be submitted for clearance from cardiology.   PROVIDER SPECIALTY FAXED TO   Ida Rogue, MD  Cardiology Forwarded to cardiology APP pool for review through [place]:  Clearance documents generated and faxed to appropriate provider(s) as noted above. Note will be updated to reflect communication with provider's office as it relates to clearance being provided and/or the need for office visit prior to clearance for surgery being issued.   Honor Loh, MSN, APRN, FNP-C, CEN [facility]  Peri-operative Services Nurse Practitioner Phone: [phone] 10/31/22 6:18 PM  NOTE: This note has been prepared using Dragon dictation software. Despite my best ability to proofread, there is always the potential that unintentional transcriptional errors may still occur from this process.

## 2022-10-31 NOTE — Telephone Encounter (Signed)
I spoke with Mr. Berrones. We have discussed possible surgery dates and Tuesday April 2nd, 2024 was agreed upon by all parties. Patient given information about surgery date, what to expect pre-operatively and post operatively.  We discussed that a Pre-Admission Testing office will be calling to set up the pre-op visit that will take place prior to surgery, and that these appointments are typically done over the phone with a Pre-Admissions RN. Informed patient that our office will communicate any additional care to be provided after surgery. Patients questions or concerns were discussed during our call. Advised to call our office should there be any additional information, questions or concerns that arise. Patient verbalized understanding.

## 2022-10-31 NOTE — Progress Notes (Signed)
   [facility]  Surgery Date: Date: 11/15/2022  Surgeon: Dr. John Giovanni, MD  Inpt ( No  )   Outpt (Yes)   Obs ( No  )   Diagnosis: D49.4 Bladder Tumor  -CPT: [id number], [phone]  Surgery: Transurethral Resection of Bladder Tumor with Intravesical Instillation of Gemcitabine  Stop Anticoagulations: No, may continue ASA  Cardiac/Medical/Pulmonary Clearance needed: no  *Orders entered into EPIC  Date: 10/31/22   *Case booked in [place]  Date: 10/31/22  *Notified pt of Surgery: Date: 10/31/22  PRE-OP UA & CX: yes, will obtain in clinic on 11/07/2022  *Placed into Prior Authorization Work Fabio Bering Date: 10/31/22  Assistant/laser/rep:No

## 2022-11-01 ENCOUNTER — Telehealth: Payer: Self-pay

## 2022-11-01 ENCOUNTER — Telehealth: Payer: Self-pay | Admitting: *Deleted

## 2022-11-01 ENCOUNTER — Other Ambulatory Visit: Payer: Self-pay | Admitting: Cardiovascular Disease

## 2022-11-01 NOTE — Telephone Encounter (Signed)
   Name: MARICUS RICCIARDELLI  DOB: 11/27/1940  MRN: [id number]  Primary Cardiologist: None   Preoperative team, please contact this patient and set up a phone call appointment for further preoperative risk assessment. Please obtain consent and complete medication review. Thank you for your help.    Mayra Reel, NP 11/01/2022, 10:50 AM [place]

## 2022-11-01 NOTE — Telephone Encounter (Signed)
Spoke with patient who is agreeable to do a tele visit on 3/25 at 2:40 pm. Med rec and consent have been completed.

## 2022-11-01 NOTE — Telephone Encounter (Signed)
  Patient Consent for Virtual Visit        [name] has provided verbal consent on 11/01/2022 for a virtual visit (video or telephone).   CONSENT FOR VIRTUAL VISIT FOR:  [name]  By participating in this virtual visit I agree to the following:  I hereby voluntarily request, consent and authorize [facility] and its employed or contracted physicians, physician assistants, nurse practitioners or other licensed health care professionals (the Practitioner), to provide me with telemedicine health care services (the "Services") as deemed necessary by the treating Practitioner. I acknowledge and consent to receive the Services by the Practitioner via telemedicine. I understand that the telemedicine visit will involve communicating with the Practitioner through live audiovisual communication technology and the disclosure of certain medical information by electronic transmission. I acknowledge that I have been given the opportunity to request an in-person assessment or other available alternative prior to the telemedicine visit and am voluntarily participating in the telemedicine visit.  I understand that I have the right to withhold or withdraw my consent to the use of telemedicine in the course of my care at any time, without affecting my right to future care or treatment, and that the Practitioner or I may terminate the telemedicine visit at any time. I understand that I have the right to inspect all information obtained and/or recorded in the course of the telemedicine visit and may receive copies of available information for a reasonable fee.  I understand that some of the potential risks of receiving the Services via telemedicine include:  Delay or interruption in medical evaluation due to technological equipment failure or disruption; Information transmitted may not be sufficient (e.g. poor resolution of images) to allow for appropriate medical decision making by the  Practitioner; and/or  In rare instances, security protocols could fail, causing a breach of personal health information.  Furthermore, I acknowledge that it is my responsibility to provide information about my medical history, conditions and care that is complete and accurate to the best of my ability. I acknowledge that Practitioner's advice, recommendations, and/or decision may be based on factors not within their control, such as incomplete or inaccurate data provided by me or distortions of diagnostic images or specimens that may result from electronic transmissions. I understand that the practice of medicine is not an exact science and that Practitioner makes no warranties or guarantees regarding treatment outcomes. I acknowledge that a copy of this consent can be made available to me via my patient portal ([place]), or I can request a printed copy by calling the office of [facility].    I understand that my insurance will be billed for this visit.   I have read or had this consent read to me. I understand the contents of this consent, which adequately explains the benefits and risks of the Services being provided via telemedicine.  I have been provided ample opportunity to ask questions regarding this consent and the Services and have had my questions answered to my satisfaction. I give my informed consent for the services to be provided through the use of telemedicine in my medical care

## 2022-11-01 NOTE — Telephone Encounter (Signed)
-----   Message from Karen Kitchens, NP sent at 10/31/2022  6:14 PM EDT ----- Regarding: Request for pre-operative cardiac clearance Request for pre-operative cardiac clearance:  1. What type of surgery is being performed?  TRANSURETHRAL RESECTION OF BLADDER TUMOR (TURBT); BLADDER INSTILLATION OF GEMCITABINE  2. When is this surgery scheduled?  11/15/2022  3. Type of clearance being requested (medical, pharmacy, both)? MEDICAL   4. Are there any medications that need to be held prior to surgery? NONE  5. Practice name and name of physician performing surgery?  Performing surgeon: Dr. John Giovanni, MD Requesting clearance: Honor Loh, FNP-C    6. Anesthesia type (none, local, MAC, general)? GENERAL  7. What is the office phone and fax number?   Phone: [phone] Fax: [phone]  ATTENTION: Unable to create telephone message as per your standard workflow. Directed by [facility] providers to send requests for cardiac clearance to this pool for appropriate distribution to provider covering pre-operative clearances.   Honor Loh, MSN, APRN, FNP-C, CEN [facility]  Peri-operative Services Nurse Practitioner Phone: [phone] 10/31/22 6:14 PM

## 2022-11-07 ENCOUNTER — Other Ambulatory Visit: Payer: HMO

## 2022-11-07 ENCOUNTER — Ambulatory Visit: Payer: HMO | Attending: Cardiovascular Disease | Admitting: Nurse Practitioner

## 2022-11-07 ENCOUNTER — Encounter
Admission: RE | Admit: 2022-11-07 | Discharge: 2022-11-07 | Disposition: A | Discharge status: Home / Self Care | Payer: HMO | Source: Ambulatory Visit | Attending: Urology | Admitting: Urology

## 2022-11-07 ENCOUNTER — Encounter: Payer: Self-pay | Admitting: Nurse Practitioner

## 2022-11-07 VITALS — Ht 72.0 in | Wt 206.0 lb

## 2022-11-07 DIAGNOSIS — I10 Essential (primary) hypertension: Secondary | ICD-10-CM | POA: Diagnosis not present

## 2022-11-07 DIAGNOSIS — D494 Neoplasm of unspecified behavior of bladder: Secondary | ICD-10-CM | POA: Diagnosis not present

## 2022-11-07 DIAGNOSIS — Z01810 Encounter for preprocedural cardiovascular examination: Secondary | ICD-10-CM | POA: Diagnosis not present

## 2022-11-07 DIAGNOSIS — E1169 Type 2 diabetes mellitus with other specified complication: Secondary | ICD-10-CM

## 2022-11-07 DIAGNOSIS — G4733 Obstructive sleep apnea (adult) (pediatric): Secondary | ICD-10-CM | POA: Diagnosis not present

## 2022-11-07 LAB — URINALYSIS, COMPLETE
Bilirubin, UA: NEGATIVE
Glucose, UA: NEGATIVE
Leukocytes,UA: NEGATIVE
Nitrite, UA: NEGATIVE
Protein,UA: NEGATIVE
RBC, UA: NEGATIVE
Specific Gravity, UA: 1.015 (ref 1.005–1.030)
Urobilinogen, Ur: 0.2 mg/dL (ref 0.2–1.0)
pH, UA: 5 (ref 5.0–7.5)

## 2022-11-07 LAB — MICROSCOPIC EXAMINATION

## 2022-11-07 NOTE — Progress Notes (Signed)
Virtual Visit via Telephone Note   Because of [name]'s co-morbid illnesses, he is at least at moderate risk for complications without adequate follow up.  This format is felt to be most appropriate for this patient at this time.  The patient did not have access to video technology/had technical difficulties with video requiring transitioning to audio format only (telephone).  All issues noted in this document were discussed and addressed.  No physical exam could be performed with this format.  Please refer to the patient's chart for his consent to telehealth for [facility].  Evaluation Performed:  Preoperative cardiovascular risk assessment _____________   Date:  11/07/2022   Patient ID:  [name], DOB [birth year], MRN [id number] Patient Location:  Home Provider location:   Office  Primary Care Provider:  Ria Bush, MD Primary Cardiologist:  [name] Rogue, MD  Chief Complaint / Patient Profile   82 y.o. y/o male with a h/o OSA intolerant to CPAP, mild bilateral carotid artery disease, HLD, CAD s/p CABG in 1990 with stenting to circumflex in 2002, last cath 06/2015 with patent grafts x 2, RCA small vessel diffuse disease, DVT, HFrEF, COPD/asthma who is pending transurethral resection of bladder tumor; bladder instillation of gemcitabine and presents today for telephonic preoperative cardiovascular risk assessment.  History of Present Illness    [name] is a 82 y.o. male who presents via audio/video conferencing for a telehealth visit today.  Pt was last seen in cardiology clinic on 08/05/22 by Dr. Rockey Situ.  At that time [name] was doing well.  The patient is now pending procedure as outlined above. Since his last visit, he reports shortness of breath that at times is more noticeable and at other times he hardly notices it. Has left leg edema that occurs intermittently, right leg does not swell. He denies chest pain, fatigue,  palpitations, melena, hematuria, hemoptysis, diaphoresis, weakness, presyncope, syncope, orthopnea, and PND. He remains active doing yard work and work around [employer]. 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 06/2019 Echo (in setting of COVID): EF 35-40%, mild LVH, g1 DD, glob HK; b. 09/2019 Echo: EF 50-55%, Gr1 DD; c. 03/2021 Echo: EF 50-55%, no rwma, mild LVH, GrI DD, nl RV fxn. RVSP 44.70mmHg. Mild-mod dil LA. Triv MR. Mild-mod Ao sclerosis.   AAA (abdominal aortic aneurysm) (HCC)    Actinic keratosis    Anginal pain ([place])    BENIGN PROSTATIC HYPERTROPHY, WITH URINARY OBSTRUCTION 09/06/2007   CAD s/p CABG    a. 1990 s/p MI-->CABG x 2; b. 2002 s/p BMS to LCX; c. 06/2015 Cath: LM 50ost, LAD 100ost/p, 76m/d, D2 nl, LCX  patent stent, RCA 80p (small), LIMA->LAD nl, VG->D2 nl-->Med Rx; d. 05/2020 MV: fixed apical ant/apical defect. No signif ischemia; e. 04/2021 MV: EF 52%, no ischemia.   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 08/2016 Carotid U/S: <39% bilat.   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 07/2017 LE duplex: RSFA 75-81m, LSFA 75-41m, 50-74d; c. 08/2018 Periph Angio: No signif AoIliac dzs. Mod-sev Ca2+ RSFA w/ diff dzs throughout- 3 vessel runoff. Borderline signif LSFA dzs w/ mod-sev Ca2+ vessels and 3 vessel runoff below the knee-->Med rx.   Pneumonia 07/2014   [facility] hospitalization   Pneumonia due to COVID-19 virus 06/02/2019   Pre-diabetes    Prostatitis, chronic    PSA, INCREASED 07/09/2008   Spinal stenosis of lumbar region    Past Surgical History:  Procedure Laterality Date   ABDOMINAL AORTOGRAM W/LOWER EXTREMITY [name] 09/12/2018   Procedure: ABDOMINAL AORTOGRAM W/LOWER EXTREMITY;  Surgeon: [name], MD;  Location: [facility];  Service: Cardiovascular;  Laterality: [name];   ANTERIOR CERVICAL DECOMP/DISCECTOMY FUSION [name] 07/11/2022   Anterior Cervical Decompression Fusion ,Interboy Prothesis,Plate/Screws , Cervical four-five,Cervical five-six [name], Dellis Filbert, MD)   APPENDECTOMY     rupture   [place] and then [place] one [place] [facility]   BLADDER INSTILLATION [name] 10/19/2021   Procedure: BLADDER INSTILLATION OF GEMCITABINE;  Surgeon: Abbie Sons, MD;  Location: [facility];  Service: Urology;  Laterality: [name];   CARDIAC CATHETERIZATION [name] 07/07/2015   Procedure: Left Heart Cath and Cors/Grafts Angiography;  Surgeon: [name], MD;  Location: [facility];  Service: Cardiovascular;  Laterality: [name];   CATARACT EXTRACTION, BILATERAL  2009   CERVICAL SPINE SURGERY  12/2016   cervical stenosis [name])   COLONOSCOPY  03/2010   HP polyp, diverticulosis, rpt 10 yrs ([name])   CORONARY ANGIOPLASTY  11/30/2000   LCX   CORONARY ARTERY BYPASS GRAFT  1990   3 vessel    CYSTOSCOPY  12/23/2010   [place]   EYE SURGERY     cataract   JOINT REPLACEMENT     KNEE ARTHROSCOPY Right    x2   LAMINOTOMY  1986   L5/S1 lumbar laminotomy for two ruptured discs/fusion    LUMBAR LAMINECTOMY/DECOMPRESSION MICRODISCECTOMY [name] 07/13/2016   Procedure: LUMBAR TWO-THREE, LUMBAR THREE-FOUR, LUMBAR FOUR-FIVE LAMINECTOMY AND FORAMINOTOMY;  Surgeon: [name], MD;  Location: [facility];  Service: Neurosurgery;  Laterality: [name];  LAMINECTOMY AND FORAMINOTOMY L2-L3, L3-L4,L4-L5   LUMBAR SPINE SURGERY  06/2020   Bilateral redo laminectomy/laminotomy/foraminotomies/medial facetectomy to decompress the bilateral L4 and L5 nerve roots [name])   TOTAL HIP ARTHROPLASTY Bilateral [id number]   TOTAL KNEE ARTHROPLASTY Right 03/06/2017   Procedure: RIGHT TOTAL KNEE ARTHROPLASTY;  Surgeon: Gaynelle Arabian, MD;  Location: [facility];  Service: Orthopedics;  Laterality: Right;   TRANSURETHRAL RESECTION OF BLADDER TUMOR [name] 10/19/2021   Procedure: TRANSURETHRAL RESECTION OF BLADDER TUMOR (TURBT);  Surgeon: Abbie Sons, MD;  Location: [facility];  Service: Urology;  Laterality: [name];    Allergies  Allergies  Allergen Reactions   Vioxx [Rofecoxib] Other (See Comments)    Hemorrhage    Spiriva Respimat [Tiotropium  Bromide Monohydrate] Other (See Comments)    Elevated bp   Contrast Media [Iodinated Contrast Media] Itching and Rash    Delayed reaction post abdominal aortagram.    Morphine Nausea Only and Other (See Comments)    Irritability     Home Medications    Prior to Admission medications   Medication Sig Start Date End Date Taking? Authorizing Provider  albuterol (PROVENTIL) (2.5 MG/3ML) 0.083% nebulizer solution Take 3 mLs (2.5 mg total) by nebulization every 6 (six) hours as needed for wheezing or shortness of breath. 07/29/22   Tyler Pita, MD  albuterol (VENTOLIN HFA) 108 (90 Base) MCG/ACT inhaler TAKE 2 PUFFS INTO LUNGS EVERY 6 HOURS ASNEEDED FOR WHEEZING OR SHORTNESS OF BREATH 10/26/22   [name] Bush, MD  aspirin EC 81 MG EC tablet Take 1 tablet (81 mg total) by mouth daily. 06/26/19   Elgergawy, Silver Huguenin, MD  atorvastatin (LIPITOR) 20 MG tablet TAKE 1 TABLET  BY MOUTH DAILY. 01/17/22   [name], MD  Azelastine HCl (ASTEPRO) 0.15 % SOLN Place 1 spray into the nose at bedtime as needed (Sinus congestion, allergies). 02/03/22   Chesley Mires, MD  cyclobenzaprine (FLEXERIL) 5 MG tablet Take 1 tablet (5 mg total) by mouth 3 (three) times daily as needed for muscle spasms. 07/12/22   [name], MD  docusate sodium (COLACE) 100 MG capsule Take 1 capsule (100 mg total) by mouth 2 (two) times daily. 07/12/22   [name], MD  doxycycline (VIBRA-TABS) 100 MG tablet Take 1 tablet (100 mg total) by mouth 2 (two) times daily. 07/20/22   Copland, Frederico Hamman, MD  ezetimibe (ZETIA) 10 MG tablet Take 1 tablet (10 mg total) by mouth daily. 08/05/22   [name], MD  famotidine (PEPCID) 20 MG tablet Take 20 mg by mouth daily.    [provider]  fluticasone (FLONASE) 50 MCG/ACT nasal spray Place 1 spray into both nostrils daily as needed for allergies or rhinitis.    [provider]  Fluticasone-Umeclidin-Vilant (TRELEGY ELLIPTA) 200-62.5-25 MCG/ACT AEPB Inhale 1 puff into the lungs daily. 08/03/22   Tyler Pita, MD  Fluticasone-Umeclidin-Vilant (TRELEGY ELLIPTA) 200-62.5-25 MCG/ACT AEPB Inhale 1 puff into the lungs daily. Patient not taking: Reported on 11/01/2022 10/04/22   Tyler Pita, MD  furosemide (LASIX) 20 MG tablet Take 1 tablet by mouth every third day as directed. 02/14/22   [name], MD  gabapentin (NEURONTIN) 600 MG tablet Take 1 tablet (600 mg total) by mouth 3 (three) times daily. 10/07/22   [name] Bush, MD  guaiFENesin-codeine [facility]) 100-10 MG/5ML syrup Take 5 mLs by mouth 3 (three) times daily as needed for cough. 07/20/22   Copland, Frederico Hamman, MD  HYDROcodone-acetaminophen (NORCO/VICODIN) 5-325 MG tablet Take 1-2 tablets by mouth every 4 (four) hours as needed for moderate pain. 07/12/22   [name], MD  isosorbide mononitrate (IMDUR) 30 MG 24 hr tablet Take 1 tablet (30 mg total)  by mouth daily. 08/05/22   [name], MD  losartan (COZAAR) 25 MG tablet TAKE 1 TABLET BY MOUTH DAILY 11/01/22   [name], MD  nitroGLYCERIN (NITROSTAT) 0.4 MG SL tablet Place 1 tablet (0.4 mg total) under the tongue every 5 (five) minutes as needed for chest pain. 05/01/21   [name] Bush, MD  polyethylene glycol (MIRALAX / GLYCOLAX) 17 g packet Take 17 g by mouth daily as needed for mild constipation.     [provider]  ranolazine (RANEXA) 500 MG 12  hr tablet TAKE ONE (1) TABLET BY MOUTH TWO TIMES PER DAY 04/19/22   [name], MD  silodosin (RAPAFLO) 8 MG CAPS capsule Take 1 capsule (8 mg total) by mouth daily with breakfast. 10/26/22   Bernardo Heater, Ronda Fairly, MD    Physical Exam    Vital Signs:  DEMARRION VILLADA does not have vital signs available for review today.  Given telephonic nature of communication, physical exam is limited. AAOx3. NAD. Normal affect.  Speech and respirations are unlabored.  Accessory Clinical Findings    None  Assessment & Plan    1.  Preoperative Cardiovascular Risk Assessment: According to the Revised Cardiac Risk Index (RCRI), his Perioperative Risk of Major Cardiac Event is (%): 11. His Functional Capacity in METs is: 5.93 according to the [place] Activity Status Index (DASI). The patient is doing well from a cardiac perspective. Therefore, based on ACC/AHA guidelines, the patient would be at acceptable risk for the planned procedure without further cardiovascular testing.   The patient was advised that if he develops new symptoms prior to surgery to contact our office to arrange for a follow-up visit, and he verbalized understanding.  He has been advised to continue aspirin through the perioperative period.   A copy of this note will be routed to requesting surgeon.  Time:   Today, I have spent 10 minutes with the patient with telehealth technology discussing medical history, symptoms, and management plan.    Emmaline Life, NP-C  11/07/2022, 3:38 PM [address], [address] Office [phone] Fax [phone]

## 2022-11-07 NOTE — Patient Instructions (Addendum)
Your procedure is scheduled on: Tuesday November 15, 2022.  Report to the Registration Desk on the 1st floor of the [facility]. To find out your arrival time, please call [phone] between 1PM - 3PM on: Monday November 14, 2022. If your arrival time is 6:00 am, do not arrive before that time as the [facility] entrance doors do not open until 6:00 am.  REMEMBER: Instructions that are not followed completely may result in serious medical risk, up to and including death; or upon the discretion of your surgeon and anesthesiologist your surgery may need to be rescheduled.  Do not eat food or drink fluids after midnight the night before surgery.  No gum chewing or hard candies.  One week prior to surgery: Stop Anti-inflammatories (NSAIDS) such as Advil, Aleve, Ibuprofen, Motrin, Naproxen, Naprosyn and Aspirin based products such as Excedrin, Goody's Powder, BC Powder. Stop ANY OVER THE COUNTER supplements until after surgery. You may however, continue to take Tylenol if needed for pain up until the day of surgery.  Continue taking all prescribed medications with the exception of the following:  Follow recommendations from Cardiologist or PCP regarding stopping blood thinners.  TAKE ONLY THESE MEDICATIONS THE MORNING OF SURGERY WITH A SIP OF WATER:   famotidine (PEPCID) 20 MG Antacid (take one the night before and one on the morning of surgery - helps to prevent nausea after surgery.) gabapentin (NEURONTIN) 600 MG  ranolazine (RANEXA) 500 MG silodosin (RAPAFLO) 8 MG    Use inhalers on the day of surgery and bring to the hospital. albuterol (VENTOLIN HFA) 108 (90 Base) MCG/ACT inhaler  Fluticasone-Umeclidin-Vilant (TRELEGY ELLIPTA) 200-62.5-25 MCG/ACT AEPB   No Alcohol for 24 hours before or after surgery.  No Smoking including e-cigarettes for 24 hours before surgery.  No chewable tobacco products for at least 6 hours before surgery.  No nicotine patches on the day of surgery.  Do  not use any "recreational" drugs for at least a week (preferably 2 weeks) before your surgery.  Please be advised that the combination of cocaine and anesthesia may have negative outcomes, up to and including death. If you test positive for cocaine, your surgery will be cancelled.  On the morning of surgery brush your teeth with toothpaste and water, you may rinse your mouth with mouthwash if you wish. Do not swallow any toothpaste or mouthwash.  Use CHG Soap or wipes as directed on instruction sheet.  Do not wear jewelry, make-up, hairpins, clips or nail polish.  Do not wear lotions, powders, or perfumes.   Do not shave body hair from the neck down 48 hours before surgery.  Contact lenses, hearing aids and dentures may not be worn into surgery.  Do not bring valuables to the hospital. [facility] is not responsible for any missing/lost belongings or valuables.   Total Shoulder Arthroplasty:  use Benzoyl Peroxide 5% Gel as directed on instruction sheet.  Bring your C-PAP to the hospital in case you may have to spend the night.   Notify your doctor if there is any change in your medical condition (cold, fever, infection).  Wear comfortable clothing (specific to your surgery type) to the hospital.  After surgery, you can help prevent lung complications by doing breathing exercises.  Take deep breaths and cough every 1-2 hours. Your doctor may order a device called an Incentive Spirometer to help you take deep breaths. When coughing or sneezing, hold a pillow firmly against your incision with both hands. This is called "splinting." Doing  this helps protect your incision. It also decreases belly discomfort.  If you are being admitted to the hospital overnight, leave your suitcase in the car. After surgery it may be brought to your room.  In case of increased patient census, it may be necessary for you, the patient, to continue your postoperative care in the Same Day Surgery  department.  If you are being discharged the day of surgery, you will not be allowed to drive home. You will need a responsible individual to drive you home and stay with you for 24 hours after surgery.   If you are taking public transportation, you will need to have a responsible individual with you.  Please call the [facility] Dept. at [phone] if you have any questions about these instructions.  Surgery Visitation Policy:  Patients undergoing a surgery or procedure may have two family members or support persons with them as long as the person is not COVID-19 positive or experiencing its symptoms.   Inpatient Visitation:    Visiting hours are 7 a.m. to 8 p.m. Up to four visitors are allowed at one time in a patient room. The visitors may rotate out with other people during the day. One designated support person (adult) may remain overnight.

## 2022-11-08 ENCOUNTER — Other Ambulatory Visit (HOSPITAL_COMMUNITY): Payer: Self-pay

## 2022-11-08 ENCOUNTER — Encounter: Payer: Self-pay | Admitting: Urology

## 2022-11-08 NOTE — Progress Notes (Signed)
Perioperative / Anesthesia Services  Pre-Admission Testing Clinical Review / Preoperative Anesthesia Consult  Date: 11/09/22  Patient Demographics:  Name: [name] DOB:   [birth year] MRN:   [id number]  Planned Surgical Procedure(s):    Case: [id number] Date/Time: 11/15/22 0853   Procedures:      TRANSURETHRAL RESECTION OF BLADDER TUMOR (TURBT)     BLADDER INSTILLATION OF GEMCITABINE   Anesthesia type: Choice   Pre-op diagnosis: Bladder Tumor   Location: [facility] OR ROOM 10 / [facility]   Surgeons: Abbie Sons, MD     NOTE: Available PAT nursing documentation and vital signs have been reviewed. Clinical nursing staff has updated patient's PMH/PSHx, current medication list, and drug allergies/intolerances to ensure comprehensive history available to assist in medical decision making as it pertains to the aforementioned surgical procedure and anticipated anesthetic course. Extensive review of available clinical information personally performed. [place] PMH and PSHx updated with any diagnoses/procedures that  may have been inadvertently omitted during his intake with the pre-admission testing department's nursing staff.  Clinical Discussion:  [name] is a 82 y.o. male who is submitted for pre-surgical anesthesia review and clearance prior to him undergoing the above procedure. Patient is a Former Smoker (25 pack years; quit 08/1988). Pertinent PMH includes: CAD (s/p CABG), MI x 2, HFimpEF, ectatic abdominal aorta, PAD, DVT, angina, carotid artery disease, LBBB, HTN, HLD, prediabetes, OSAH (requires nocturnal PAP therapy), emphysema, GERD (on daily H2 blocker), hiatal hernia, BPH, chronic prostatitis, lumbar disc disease with radiculopathy, neuropathy in BILATERAL feet, OA.  Patient is followed by cardiology [name] Situ, MD). He was last seen in the cardiology clinic on 08/05/2022; notes reviewed. At the time of his clinic visit, patient doing well overall  from a cardiovascular perspective.  Patient complained of mild shortness of breath related to his underlying asthma and COPD diagnoses. Patient denied any chest pain, PND, orthopnea, palpitations, significant peripheral edema, weakness, fatigue, vertiginous symptoms, or presyncope/syncope. Patient with a past medical history significant for cardiovascular diagnoses. Documented physical exam was grossly benign, providing no evidence of acute exacerbation and/or decompensation of the patient's known cardiovascular conditions.  Of note, complete records regarding patient's cardiovascular history unavailable for review at time of consult.  Information gathered from patient report and from notes provided by his local cardiologist.  Patient suffered an MI in 29.  Cardiovascular event resulted and a three-vessel revascularization procedure.  LIMA-LAD, SVG-D1, SVG-RI bypass grafts were placed.  Patient suffered a second MI and 2002. He underwent diagnostic LEFT heart catheterization in 2002 resulting in PCI and subsequent BMS (unknown type) placement to the LCx.  Patient underwent diagnostic LEFT heart catheterization on 07/07/2015 revealing multivessel CAD; 50% ostial LM-LM, 80% ostial RCA, 80% proximal RCA-1, 80% proximal RCA-2, 60% distal LAD, 100% ostial to proximal LAD, 60% mid LAD, and 50% D2.  LIMA-LAD and SVG-D2 bypass grafts were patent.  The decision was made to defer further intervention opting for medical management.  Patient with known carotid artery disease.  Most recent carotid Doppler was performed on 10/12/2022 revealing mild to moderate (<50%) BILATERAL bifurcation plaque.  Most recent TTE was performed on 03/22/2021 revealing low normal left ventricular systolic function with an EF of 50-55%.  There were no regional wall motion abnormalities.  There was mild LVH. Left ventricular diastolic Doppler parameters consistent with abnormal relaxation (G1DD).  Right ventricular size and function  normal.  PASP mildly elevated at 44.7 mmHg.  Left atrium mild to moderately dilated.  There  was trivial mitral valve regurgitation.  There was mild to moderate aortic valve sclerosis/calcification present. All transvalvular gradients were noted to be normal providing no evidence suggestive of valvular stenosis.  Most recent myocardial perfusion imaging study was performed on 04/22/2021 revealing a low normal left ventricular systolic function with an EF of 52%.  There was no evidence of stress-induced myocardial ischemia or arrhythmia; no scintigraphic evidence of scar.  Study determined to be low risk.  Patient with known abdominal aortic ectasia.  Last CT imaging of the abdomen pelvis performed on 01/01/2022 measured the infrarenal abdominal aorta at 2.9 cm, which was similar to previous measurements.  Blood pressure well controlled at 120/60 mmHg on currently prescribed diuretic (furosemide), nitrate (isosorbide mononitrate), ARB (losartan), and alpha-blocker (sildosin) therapies.  Patient is also on scheduled ranolazine for prevention of recurrent angina/anginal equivalent symptoms. Patient is on atorvastatin for his HLD diagnosis and ASCVD prevention.  Patient has a prediabetes diagnosis; last hemoglobin A1c was 6.5% when checked on 02/21/2022.  Patient does have an OSAH diagnosis and is partially compliant with prescribed nocturnal PAP therapy.  He does experience issues with tolerating facemask.  Functional capacity somewhat limited by age and multiple medical comorbidities.  With that being said, patient still felt to be able to achieve at least 4 METS of physical activity without experiencing any significant degree of angina/anginal equivalent symptoms.  No changes were made to his medication regimen.  Patient to follow-up with outpatient cardiology in 6 months or sooner if needed.  [name] is scheduled for an TRANSURETHRAL RESECTION OF BLADDER TUMOR (TURBT); BLADDER INSTILLATION OF  GEMCITABINE on 11/15/2022 with Dr. John Giovanni, MD.  Given patient's past medical history significant for cardiovascular diagnoses, presurgical cardiac clearance was sought by the PAT team.  Per cardiology, "according to the [facility], his perioperative risk of major cardiac event is 11%. His functional capacity in METs is 5.93 according to the DASI. The patient is doing well from a cardiac perspective. Therefore, based on ACC/AHA guidelines, the patient would be at an ACCEPTABLE risk for the planned procedure without further cardiovascular testing".   In review of his medication reconciliation, it is noted that patient is currently on prescribed daily antiplatelet therapy. Given significant cardiovascular surgery, primary attending surgeon okay with patient continuing his daily low-dose ASA throughout his perioperative course.  Patient denies previous perioperative complications with anesthesia in the past. In review of the available records, it is noted that patient underwent a general anesthetic course here at [facility] (ASA III) in 06/2022 without documented complications.      11/07/2022    2:33 PM 10/26/2022    9:22 AM 10/07/2022   10:59 AM  Vitals with BMI  Height 6\' 0"  6\' 0"  6\' 0"   Weight 206 lbs 210 lbs 210 lbs 6 oz  BMI 27.93 [id number]  Systolic  0000000 [id number]  Diastolic  72 64  Pulse  77 67   Providers/Specialists:   NOTE: Primary physician provider listed below. Patient may have been seen by APP or partner within same practice.   PROVIDER ROLE / SPECIALTY LAST Claud Kelp, MD Urology (Surgeon) 10/26/2022  Ria Bush, MD Primary Care Provider 10/07/2022  Ida Rogue, MD Cardiology 08/05/2022  Vernard Gambles, MD Pulmonary Medicine 10/04/2022   Allergies:  Vioxx [rofecoxib], Spiriva respimat [tiotropium bromide monohydrate], Contrast media [iodinated contrast media], and Morphine  Current Home Medications:   No current  facility-administered medications for this encounter.  albuterol (PROVENTIL) (2.5 MG/3ML) 0.083% nebulizer solution   albuterol (VENTOLIN HFA) 108 (90 Base) MCG/ACT inhaler   aspirin EC 81 MG EC tablet   atorvastatin (LIPITOR) 20 MG tablet   Azelastine HCl (ASTEPRO) 0.15 % SOLN   cyclobenzaprine (FLEXERIL) 5 MG tablet   docusate sodium (COLACE) 100 MG capsule   doxycycline (VIBRA-TABS) 100 MG tablet   ezetimibe (ZETIA) 10 MG tablet   famotidine (PEPCID) 20 MG tablet   fluticasone (FLONASE) 50 MCG/ACT nasal spray   Fluticasone-Umeclidin-Vilant (TRELEGY ELLIPTA) 200-62.5-25 MCG/ACT AEPB   Fluticasone-Umeclidin-Vilant (TRELEGY ELLIPTA) 200-62.5-25 MCG/ACT AEPB   furosemide (LASIX) 20 MG tablet   gabapentin (NEURONTIN) 600 MG tablet   guaiFENesin-codeine (ROBITUSSIN AC) 100-10 MG/5ML syrup   HYDROcodone-acetaminophen (NORCO/VICODIN) 5-325 MG tablet   isosorbide mononitrate (IMDUR) 30 MG 24 hr tablet   losartan (COZAAR) 25 MG tablet   nitroGLYCERIN (NITROSTAT) 0.4 MG SL tablet   polyethylene glycol (MIRALAX / GLYCOLAX) 17 g packet   ranolazine (RANEXA) 500 MG 12 hr tablet   silodosin (RAPAFLO) 8 MG CAPS capsule   History:   Past Medical History:  Diagnosis Date   (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 06/2019 Echo (in setting of COVID): EF 35-40%, mild LVH, g1 DD, glob HK; b. 09/2019 Echo: EF 50-55%, Gr1 DD; c. 03/2021 Echo: EF 50-55%, no rwma, mild LVH, GrI DD, nl RV fxn. RVSP 44.60mmHg. Mild-mod dil LA. Triv MR. Mild-mod Ao sclerosis.   Actinic keratosis    Anginal pain (HCC)    Aortic ectasia, abdominal ([place])    a.) CT abd 01/01/2022: 2.9 cm infrarenal abdominal aortic ectasia   Bladder cancer ([place]) 2023   BPH (benign prostatic hyperplasia) 09/06/2007   CAD s/p CABG    a.) 1990 s/p MI--> CABG x 3; b.) LHC/PCI 2002 --> BMS (unk type) to LCX; c.) LHC 07/07/2015: 50% oLM-LM, 80% oRCA, 80% pRCA-1, 80% pRCA-2, 60% dLAD, 100% o-pLAD, 60% mLAD, 50% D2,  LIMA-LAD, SVG-D2, SVG-RI --> med Rx; d.) MV 06/05/2020: fixed apical ant/apical defect. No sign ischemia; e.) MV 09/08/202: EF 52%, no ischemia.   Carotid arterial disease ([place])    a.) 08/2016 Carotid U/S: <39% bilat; b.) carotid doppler 10/12/2022: mild-to-moderate (<50%) bilateral bifurcation plaque.   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   CKD (chronic kidney disease), stage III ([place])    Community acquired pneumonia of right lower lobe of lung 06/05/2017   Diverticulosis    Dupuytren's contracture of right hand 10/29/2008   DVT (deep venous thrombosis) ([place]) 1998   Elevated prostate specific antigen (PSA) 07/09/2008   Emphysema of lung ([place])    GERD 04/30/2007   Heart murmur    History of 2019 novel coronavirus disease (COVID-19) 05/20/2019   History of hiatal hernia    History of shingles    HLD (hyperlipidemia) 04/26/2007   HTN (hypertension) 04/30/2007   Laceration of skin of left hand 03/08/2018   LBBB (left bundle branch block)    Lumbar disc disease with radiculopathy    Migraines    Myocardial infarction ([place]) 1990   a.) resulted in 3v CABG   Myocardial infarction ([place]) 2002   a.) second cardiac event; PCI with placement of BMS (unknown type) to LCx   Neuropathy of both feet    OSA on CPAP    Osteoarthritis    PAD (peripheral artery disease) ([place])    a. 07/2017 LE duplex: RSFA 75-40m, LSFA 75-6m, 50-74d; c. 08/2018 Periph Angio:  No signif AoIliac dzs. Mod-sev Ca2+ RSFA w/ diff dzs throughout- 3 vessel runoff. Borderline signif LSFA dzs w/ mod-sev Ca2+ vessels and 3 vessel runoff below the knee-->Med rx.   Pneumonia 07/2014   Pneumonia due to COVID-19 virus 05/30/2019   a.) required hospitilization   Pre-diabetes    Prostatitis, chronic    Right inguinal hernia 05/26/2010   S/P CABG x 3 1990   a.) LIMA-LAD, SVG-D2, SVG-RI   Spinal stenosis of lumbar region    Past Surgical History:  Procedure Laterality Date   ABDOMINAL AORTOGRAM  W/LOWER EXTREMITY [name] 09/12/2018   Procedure: ABDOMINAL AORTOGRAM W/LOWER EXTREMITY;  Surgeon: [name], MD;  Location: [facility];  Service: Cardiovascular;  Laterality: [name];   ANTERIOR CERVICAL DECOMP/DISCECTOMY FUSION [name] 07/11/2022   Anterior Cervical Decompression Fusion ,Interboy Prothesis,Plate/Screws , Cervical four-five,Cervical five-six [name], Dellis Filbert, MD)   APPENDECTOMY     rupture   [place] and then another one at [facility]   BLADDER INSTILLATION [name] 10/19/2021   Procedure: BLADDER INSTILLATION OF GEMCITABINE;  Surgeon: Abbie Sons, MD;  Location: [facility];  Service: Urology;  Laterality: [name];   CARDIAC CATHETERIZATION [name] 07/07/2015   Procedure: Left Heart Cath and Cors/Grafts Angiography;  Surgeon: [name], MD;  Location: [facility];  Service: Cardiovascular;  Laterality: [name];   CATARACT EXTRACTION, BILATERAL  2009   CERVICAL SPINE SURGERY  12/2016   cervical stenosis [name])   COLONOSCOPY  03/2010   HP polyp, diverticulosis, rpt 10 yrs ([name])   CORONARY ANGIOPLASTY  11/30/2000   LCX   CORONARY ARTERY BYPASS GRAFT  1990   3 vessel    CYSTOSCOPY  12/23/2010   [place]   JOINT REPLACEMENT     KNEE ARTHROSCOPY Right    x2   LAMINOTOMY  1986   L5/S1 lumbar laminotomy for two ruptured discs/fusion   LUMBAR LAMINECTOMY/DECOMPRESSION MICRODISCECTOMY [name] 07/13/2016   Procedure: LUMBAR TWO-THREE, LUMBAR THREE-FOUR, LUMBAR FOUR-FIVE LAMINECTOMY AND FORAMINOTOMY;  Surgeon: [name], MD;  Location: [facility];  Service: Neurosurgery;  Laterality: [name];  LAMINECTOMY AND FORAMINOTOMY L2-L3, L3-L4,L4-L5   LUMBAR SPINE SURGERY  06/2020   Bilateral redo laminectomy/laminotomy/foraminotomies/medial facetectomy to decompress the bilateral L4 and L5 nerve roots [name])   TOTAL HIP ARTHROPLASTY Bilateral [id number]   TOTAL KNEE ARTHROPLASTY Right 03/06/2017   Procedure: RIGHT TOTAL KNEE ARTHROPLASTY;  Surgeon: Gaynelle Arabian, MD;   Location: [facility];  Service: Orthopedics;  Laterality: Right;   TRANSURETHRAL RESECTION OF BLADDER TUMOR [name] 10/19/2021   Procedure: TRANSURETHRAL RESECTION OF BLADDER TUMOR (TURBT);  Surgeon: Abbie Sons, MD;  Location: [facility];  Service: Urology;  Laterality: [name];   Family History  Problem Relation Age of Onset   Stroke Mother    Heart attack Mother    Diabetes Mother    Stroke Father    Heart disease Father    Polycystic kidney disease Father    Cancer Sister        throat   Polycystic kidney disease Sister    Diabetes Brother        1/2 brother   Cancer Other        5/7 nephews with lung cancer   Social History   Tobacco Use   Smoking status: Former    Packs/day: 1.00    Years: 25.00    Additional pack years: 0.00    Total pack years: 25.00    Types: Cigarettes    Quit date: 08/15/1988  Years since quitting: 34.2    Passive exposure: Past   Smokeless tobacco: Never  Vaping Use   Vaping Use: Never used  Substance Use Topics   Alcohol use: No    Alcohol/week: 0.0 standard drinks of alcohol   Drug use: No    Pertinent Clinical Results:  LABS:   Lab Results  Component Value Date   WBC 6.0 10/04/2022   HGB 13.0 10/04/2022   HCT 39.8 10/04/2022   MCV 96.1 10/04/2022   PLT 150 10/04/2022   Lab Results  Component Value Date   NA 140 10/07/2022   K 4.6 10/07/2022   CO2 28 10/07/2022   GLUCOSE 103 (H) 10/07/2022   BUN 25 (H) 10/07/2022   CREATININE 1.28 10/07/2022   CALCIUM 9.1 10/07/2022   GFRNONAA 47 (L) 06/14/2022   Appointment on 11/07/2022  Component Date Value Ref Range Status   Urine Culture, Comprehensive 11/07/2022 Final report (A)   Final   Organism ID, Bacteria 11/07/2022 Enterococcus faecalis (A)   Final   Comment: For Enterococcus species, aminoglycosides (except for high-level resistance screening), cephalosporins, clindamycin, and trimethoprim-sulfamethoxazole are not effective clinically. (CLSI, M100-S26, 2016) 3,000 Colonies/mL     Organism ID, Bacteria [id number] Not applicable   Final   ANTIMICROBIAL SUSCEPTIBILITY 11/07/2022 Comment   Final   Comment:       ** S = Susceptible; I = Intermediate; R = Resistant **                    P = Positive; N = Negative             MICS are expressed in micrograms per mL    Antibiotic                 RSLT#1    RSLT#2    RSLT#3    RSLT#4 Ciprofloxacin                  S Levofloxacin                   S Nitrofurantoin                 S Penicillin                     S Tetracycline                   R Vancomycin                     S    Specific Gravity, UA 11/07/2022 1.015  1.005 - 1.030 Final   pH, UA 11/07/2022 5.0  5.0 - 7.5 Final   Color, UA 11/07/2022 Yellow  Yellow Final   Appearance Ur 11/07/2022 Clear  Clear Final   Leukocytes,UA 11/07/2022 Negative  Negative Final   Protein,UA 11/07/2022 Negative  Negative/Trace Final   Glucose, UA 11/07/2022 Negative  Negative Final   Ketones, UA 11/07/2022 Trace (A)  Negative Final   RBC, UA 11/07/2022 Negative  Negative Final   Bilirubin, UA 11/07/2022 Negative  Negative Final   Urobilinogen, Ur 11/07/2022 0.2  0.2 - 1.0 mg/dL Final   Nitrite, UA 11/07/2022 Negative  Negative Final   Microscopic Examination 11/07/2022 See below:   Final   WBC, UA 11/07/2022 0-5  0 - 5 /hpf Final   RBC, Urine 11/07/2022 0-2  0 - 2 /hpf Final   Epithelial Cells (non renal) 11/07/2022 0-10  0 -  10 /hpf Final   Bacteria, UA 11/07/2022 Few  None seen/Few Final    ECG: Date: 08/05/2022 Time ECG obtained: 0953 AM Rate: 79 bpm Rhythm: normal sinus; LBBB Axis (leads I and aVF): Left axis deviation Intervals: PR 160 ms. QRS 126 ms. QTc 429 ms. ST segment and T wave changes: No evidence of acute ST segment elevation or depression Comparison: Similar to previous tracing obtained on 05/19/2022   IMAGING / PROCEDURES: MYOCARDIAL PERFUSION IMAGING STUDY (LEXISCAN) performed on 04/22/2021 Low normal left ventricular systolic function with an EF  of 50% No regional wall motion abnormalities No evidence of stress-induced myocardial ischemia or arrhythmia; no scintigraphic evidence of scar Normal low risk study  PULMONARY FUNCTION TESTING performed on 03/24/2021   TRANSTHORACIC ECHOCARDIOGRAM performed on 03/22/2021 Left ventricular ejection fraction, by estimation, is 50 to 55%. The left ventricle has low normal function. The left ventricle has no regional wall motion abnormalities. There is mild left ventricular hypertrophy. Left ventricular diastolic parameters are consistent with Grade I diastolic dysfunction (impaired relaxation).  Right ventricular systolic function is normal. The right ventricular size is normal. There is mildly elevated pulmonary artery systolic pressure. The estimated right ventricular systolic pressure is 123456 mmHg.  Left atrial size was mild to moderately dilated.  The mitral valve is normal in structure. Trivial mitral valve regurgitation. No evidence of mitral stenosis.  The aortic valve is normal in structure. Aortic valve regurgitation is not visualized. Mild to moderate aortic valve sclerosis/calcification is present, without any evidence of aortic stenosis.   Impression and Plan:  MERRIC ANSARI has been referred for pre-anesthesia review and clearance prior to him undergoing the planned anesthetic and procedural courses. Available labs, pertinent testing, and imaging results were personally reviewed by me in preparation for upcoming operative/procedural course. [facility] medical record has been updated following extensive record review and patient interview with PAT staff.   This patient has been appropriately cleared by cardiology with an overall ACCEPTABLE risk of significant perioperative cardiovascular complications. Based on clinical review performed today (11/09/22), barring any significant acute changes in the patient's overall condition, it is anticipated that he will be able to proceed with the  planned surgical intervention. Any acute changes in clinical condition may necessitate his procedure being postponed and/or cancelled. Patient will meet with anesthesia team (MD and/or CRNA) on the day of his procedure for preoperative evaluation/assessment. Questions regarding anesthetic course will be fielded at that time.   Pre-surgical instructions were reviewed with the patient during his PAT appointment, and questions were fielded to satisfaction by PAT clinical staff. He has been instructed on which medications that he will need to hold prior to surgery, as well as the ones that have been deemed safe/appropriate to take of the day of his procedure. As part of the general education provided by PAT, patient made aware both verbally and in writing, that he would need to abstain from the use of any illegal substances during his perioperative course.  He was advised that failure to follow the provided instructions could necessitate case cancellation or result serious perioperative complications up to and including death. Patient encouraged to contact PAT and/or his surgeon's office to discuss any questions or concerns that may arise prior to surgery; verbalized understanding.   Honor Loh, MSN, APRN, FNP-C, CEN [facility]  Peri-operative Services Nurse Practitioner Phone: [phone] Fax: [phone] 11/09/22 1:46 PM  NOTE: This note has been prepared using Dragon dictation software. Despite my best ability to  proofread, there is always the potential that unintentional transcriptional errors may still occur from this process.

## 2022-11-09 ENCOUNTER — Other Ambulatory Visit (HOSPITAL_COMMUNITY): Payer: Self-pay

## 2022-11-09 ENCOUNTER — Encounter: Payer: Self-pay | Admitting: Urology

## 2022-11-09 ENCOUNTER — Other Ambulatory Visit: Payer: Self-pay

## 2022-11-09 LAB — CULTURE, URINE COMPREHENSIVE

## 2022-11-10 ENCOUNTER — Other Ambulatory Visit: Payer: Self-pay | Admitting: *Deleted

## 2022-11-10 ENCOUNTER — Telehealth: Payer: Self-pay | Admitting: *Deleted

## 2022-11-10 ENCOUNTER — Other Ambulatory Visit: Payer: Self-pay

## 2022-11-10 ENCOUNTER — Encounter (HOSPITAL_COMMUNITY): Payer: Self-pay

## 2022-11-10 ENCOUNTER — Other Ambulatory Visit (HOSPITAL_COMMUNITY): Payer: Self-pay

## 2022-11-10 MED ORDER — AMOXICILLIN 875 MG PO TABS: 875.0000 mg | ORAL_TABLET | Freq: Two times a day (BID) | ORAL | 0 refills | Status: DC

## 2022-11-10 NOTE — Telephone Encounter (Signed)
Notified patient as instructed, patient pleased °

## 2022-11-10 NOTE — Telephone Encounter (Signed)
-----   Message from Abbie Sons, MD sent at 11/10/2022  1:28 PM EDT ----- Preop urine culture grew a low level of bacteria.  Please send in Rx amoxicillin 875 mg twice daily x 5 days.  It is okay if he starts the medication tomorrow

## 2022-11-14 ENCOUNTER — Other Ambulatory Visit (HOSPITAL_COMMUNITY): Payer: Self-pay

## 2022-11-15 ENCOUNTER — Ambulatory Visit: Payer: HMO | Admitting: Urgent Care

## 2022-11-15 ENCOUNTER — Encounter: Payer: Self-pay | Admitting: Urology

## 2022-11-15 ENCOUNTER — Encounter
Admission: RE | Disposition: A | Discharge status: Home / Self Care | Payer: Self-pay | Source: Home / Self Care | Attending: Urology

## 2022-11-15 ENCOUNTER — Ambulatory Visit
Admission: RE | Admit: 2022-11-15 | Discharge: 2022-11-15 | Disposition: A | Discharge status: Home / Self Care | Payer: HMO | Attending: Urology | Admitting: Urology

## 2022-11-15 ENCOUNTER — Other Ambulatory Visit: Payer: Self-pay

## 2022-11-15 DIAGNOSIS — C672 Malignant neoplasm of lateral wall of bladder: Secondary | ICD-10-CM | POA: Insufficient documentation

## 2022-11-15 DIAGNOSIS — I2510 Atherosclerotic heart disease of native coronary artery without angina pectoris: Secondary | ICD-10-CM | POA: Diagnosis not present

## 2022-11-15 DIAGNOSIS — I700 Atherosclerosis of aorta: Secondary | ICD-10-CM | POA: Insufficient documentation

## 2022-11-15 DIAGNOSIS — G4733 Obstructive sleep apnea (adult) (pediatric): Secondary | ICD-10-CM | POA: Diagnosis not present

## 2022-11-15 DIAGNOSIS — Z79899 Other long term (current) drug therapy: Secondary | ICD-10-CM | POA: Insufficient documentation

## 2022-11-15 DIAGNOSIS — F419 Anxiety disorder, unspecified: Secondary | ICD-10-CM | POA: Insufficient documentation

## 2022-11-15 DIAGNOSIS — E1151 Type 2 diabetes mellitus with diabetic peripheral angiopathy without gangrene: Secondary | ICD-10-CM | POA: Diagnosis not present

## 2022-11-15 DIAGNOSIS — K219 Gastro-esophageal reflux disease without esophagitis: Secondary | ICD-10-CM | POA: Diagnosis not present

## 2022-11-15 DIAGNOSIS — Z7951 Long term (current) use of inhaled steroids: Secondary | ICD-10-CM | POA: Insufficient documentation

## 2022-11-15 DIAGNOSIS — Z951 Presence of aortocoronary bypass graft: Secondary | ICD-10-CM | POA: Insufficient documentation

## 2022-11-15 DIAGNOSIS — E1122 Type 2 diabetes mellitus with diabetic chronic kidney disease: Secondary | ICD-10-CM | POA: Insufficient documentation

## 2022-11-15 DIAGNOSIS — Z87891 Personal history of nicotine dependence: Secondary | ICD-10-CM | POA: Diagnosis not present

## 2022-11-15 DIAGNOSIS — E785 Hyperlipidemia, unspecified: Secondary | ICD-10-CM | POA: Insufficient documentation

## 2022-11-15 DIAGNOSIS — C679 Malignant neoplasm of bladder, unspecified: Secondary | ICD-10-CM

## 2022-11-15 DIAGNOSIS — I130 Hypertensive heart and chronic kidney disease with heart failure and stage 1 through stage 4 chronic kidney disease, or unspecified chronic kidney disease: Secondary | ICD-10-CM | POA: Insufficient documentation

## 2022-11-15 DIAGNOSIS — N183 Chronic kidney disease, stage 3 unspecified: Secondary | ICD-10-CM | POA: Insufficient documentation

## 2022-11-15 DIAGNOSIS — N400 Enlarged prostate without lower urinary tract symptoms: Secondary | ICD-10-CM | POA: Insufficient documentation

## 2022-11-15 DIAGNOSIS — M1990 Unspecified osteoarthritis, unspecified site: Secondary | ICD-10-CM | POA: Diagnosis not present

## 2022-11-15 DIAGNOSIS — J439 Emphysema, unspecified: Secondary | ICD-10-CM | POA: Diagnosis not present

## 2022-11-15 DIAGNOSIS — Z981 Arthrodesis status: Secondary | ICD-10-CM | POA: Insufficient documentation

## 2022-11-15 DIAGNOSIS — Z86718 Personal history of other venous thrombosis and embolism: Secondary | ICD-10-CM | POA: Diagnosis not present

## 2022-11-15 DIAGNOSIS — G4730 Sleep apnea, unspecified: Secondary | ICD-10-CM | POA: Insufficient documentation

## 2022-11-15 DIAGNOSIS — I447 Left bundle-branch block, unspecified: Secondary | ICD-10-CM | POA: Insufficient documentation

## 2022-11-15 DIAGNOSIS — I252 Old myocardial infarction: Secondary | ICD-10-CM | POA: Diagnosis not present

## 2022-11-15 DIAGNOSIS — E1169 Type 2 diabetes mellitus with other specified complication: Secondary | ICD-10-CM

## 2022-11-15 DIAGNOSIS — D494 Neoplasm of unspecified behavior of bladder: Secondary | ICD-10-CM | POA: Diagnosis not present

## 2022-11-15 DIAGNOSIS — I25119 Atherosclerotic heart disease of native coronary artery with unspecified angina pectoris: Secondary | ICD-10-CM | POA: Diagnosis not present

## 2022-11-15 DIAGNOSIS — I5032 Chronic diastolic (congestive) heart failure: Secondary | ICD-10-CM | POA: Insufficient documentation

## 2022-11-15 HISTORY — DX: Obstructive sleep apnea (adult) (pediatric): G47.33

## 2022-11-15 HISTORY — PX: BLADDER INSTILLATION: SHX6893

## 2022-11-15 HISTORY — DX: Unspecified mononeuropathy of bilateral lower limbs: G57.93

## 2022-11-15 HISTORY — PX: TRANSURETHRAL RESECTION OF BLADDER TUMOR: SHX2575

## 2022-11-15 HISTORY — DX: Diverticulosis of intestine, part unspecified, without perforation or abscess without bleeding: K57.90

## 2022-11-15 HISTORY — DX: Left bundle-branch block, unspecified: I44.7

## 2022-11-15 HISTORY — DX: Migraine, unspecified, not intractable, without status migrainosus: G43.909

## 2022-11-15 HISTORY — DX: Abdominal aortic ectasia: I77.811

## 2022-11-15 SURGERY — TURBT (TRANSURETHRAL RESECTION OF BLADDER TUMOR)
Anesthesia: General | Site: Bladder

## 2022-11-15 MED ORDER — ACETAMINOPHEN 10 MG/ML IV SOLN: 1000.0000 mg | Freq: Once | INTRAVENOUS | Status: DC | PRN

## 2022-11-15 MED ORDER — OXYCODONE HCL 5 MG/5ML PO SOLN: 5.0000 mg | Freq: Once | ORAL | Status: DC | PRN

## 2022-11-15 MED ORDER — GEMCITABINE CHEMO FOR BLADDER INSTILLATION 2000 MG
INTRAVENOUS | Status: DC | PRN
  Administered 2022-11-15: 2000 mg via INTRAVESICAL

## 2022-11-15 MED ORDER — GEMCITABINE CHEMO FOR BLADDER INSTILLATION 2000 MG
2000.0000 mg | Freq: Once | INTRAVENOUS | Status: DC
  Filled 2022-11-15: qty 52.6

## 2022-11-15 MED ORDER — ORAL CARE MOUTH RINSE: 15.0000 mL | Freq: Once | OROMUCOSAL | Status: AC

## 2022-11-15 MED ORDER — CEFAZOLIN SODIUM-DEXTROSE 2-4 GM/100ML-% IV SOLN
INTRAVENOUS | Status: AC
  Filled 2022-11-15: qty 100

## 2022-11-15 MED ORDER — PHENYLEPHRINE 80 MCG/ML (10ML) SYRINGE FOR IV PUSH (FOR BLOOD PRESSURE SUPPORT)
PREFILLED_SYRINGE | INTRAVENOUS | Status: DC | PRN
  Administered 2022-11-15: 160 ug via INTRAVENOUS
  Administered 2022-11-15: 80 ug via INTRAVENOUS
  Administered 2022-11-15 (×2): 160 ug via INTRAVENOUS

## 2022-11-15 MED ORDER — CEFAZOLIN SODIUM-DEXTROSE 2-4 GM/100ML-% IV SOLN
2.0000 g | INTRAVENOUS | Status: AC
  Administered 2022-11-15: 2 g via INTRAVENOUS

## 2022-11-15 MED ORDER — PROPOFOL 10 MG/ML IV BOLUS
INTRAVENOUS | Status: DC | PRN
  Administered 2022-11-15: 100 mg via INTRAVENOUS

## 2022-11-15 MED ORDER — PROMETHAZINE HCL 25 MG/ML IJ SOLN: 6.2500 mg | INTRAMUSCULAR | Status: DC | PRN

## 2022-11-15 MED ORDER — FENTANYL CITRATE (PF) 100 MCG/2ML IJ SOLN
INTRAMUSCULAR | Status: AC
  Filled 2022-11-15: qty 2

## 2022-11-15 MED ORDER — CHLORHEXIDINE GLUCONATE 0.12 % MT SOLN: 15.0000 mL | Freq: Once | OROMUCOSAL | Status: AC

## 2022-11-15 MED ORDER — FENTANYL CITRATE (PF) 100 MCG/2ML IJ SOLN: 25.0000 ug | INTRAMUSCULAR | Status: DC | PRN

## 2022-11-15 MED ORDER — FENTANYL CITRATE (PF) 100 MCG/2ML IJ SOLN
INTRAMUSCULAR | Status: DC | PRN
  Administered 2022-11-15: 25 ug via INTRAVENOUS

## 2022-11-15 MED ORDER — SODIUM CHLORIDE 0.9 % IR SOLN
Status: DC | PRN
  Administered 2022-11-15: 6000 mL via INTRAVESICAL

## 2022-11-15 MED ORDER — CHLORHEXIDINE GLUCONATE 0.12 % MT SOLN
OROMUCOSAL | Status: AC
  Administered 2022-11-15: 15 mL via OROMUCOSAL
  Filled 2022-11-15: qty 15

## 2022-11-15 MED ORDER — LIDOCAINE HCL (CARDIAC) PF 100 MG/5ML IV SOSY
PREFILLED_SYRINGE | INTRAVENOUS | Status: DC | PRN
  Administered 2022-11-15: 50 mg via INTRAVENOUS

## 2022-11-15 MED ORDER — OXYCODONE HCL 5 MG PO TABS: 5.0000 mg | ORAL_TABLET | Freq: Once | ORAL | Status: DC | PRN

## 2022-11-15 MED ORDER — ONDANSETRON HCL 4 MG/2ML IJ SOLN
INTRAMUSCULAR | Status: DC | PRN
  Administered 2022-11-15: 4 mg via INTRAVENOUS

## 2022-11-15 MED ORDER — SODIUM CHLORIDE 0.9 % IV SOLN: INTRAVENOUS | Status: DC

## 2022-11-15 MED ORDER — DROPERIDOL 2.5 MG/ML IJ SOLN: 0.6250 mg | Freq: Once | INTRAMUSCULAR | Status: DC | PRN

## 2022-11-15 SURGICAL SUPPLY — 24 items
BAG DRAIN SIEMENS DORNER NS (MISCELLANEOUS) ×2 IMPLANT
BAG DRN NS LF (MISCELLANEOUS) ×1
BAG DRN RND TRDRP ANRFLXCHMBR (UROLOGICAL SUPPLIES) ×1
BAG URINE DRAIN 2000ML AR STRL (UROLOGICAL SUPPLIES) ×2 IMPLANT
CATH FOL 2WAY LX 18X30 (CATHETERS) IMPLANT
CATH FOLEY 2WAY 18X30 (CATHETERS) IMPLANT
CATH FOLEY 2WAY SIL 18X30 (CATHETERS)
DRAPE UTILITY 15X26 TOWEL STRL (DRAPES) ×2 IMPLANT
DRSG TELFA 3X4 N-ADH STERILE (GAUZE/BANDAGES/DRESSINGS) ×2 IMPLANT
ELECT LOOP 22F BIPOLAR SML (ELECTROSURGICAL) ×1
ELECTRODE LOOP 22F BIPOLAR SML (ELECTROSURGICAL) IMPLANT
GLOVE BIOGEL PI IND STRL 7.5 (GLOVE) ×2 IMPLANT
GOWN STRL REUS W/ TWL LRG LVL3 (GOWN DISPOSABLE) ×2 IMPLANT
GOWN STRL REUS W/TWL LRG LVL3 (GOWN DISPOSABLE) ×1
GOWN STRL REUS W/TWL XL LVL4 (GOWN DISPOSABLE) ×2 IMPLANT
IV NS IRRIG 3000ML ARTHROMATIC (IV SOLUTION) ×4 IMPLANT
KIT TURNOVER CYSTO (KITS) ×2 IMPLANT
LOOP CUT BIPOLAR 24F LRG (ELECTROSURGICAL) IMPLANT
PACK CYSTO AR (MISCELLANEOUS) ×2 IMPLANT
SET IRRIG Y TYPE TUR BLADDER L (SET/KITS/TRAYS/PACK) ×2 IMPLANT
SURGILUBE 2OZ TUBE FLIPTOP (MISCELLANEOUS) ×2 IMPLANT
SYR TOOMEY IRRIG 70ML (MISCELLANEOUS) ×1
SYRINGE TOOMEY IRRIG 70ML (MISCELLANEOUS) ×2 IMPLANT
WATER STERILE IRR 500ML POUR (IV SOLUTION) ×2 IMPLANT

## 2022-11-15 NOTE — Discharge Instructions (Addendum)
AMBULATORY SURGERY  °DISCHARGE INSTRUCTIONS ° ° °The drugs that you were given will stay in your system until tomorrow so for the next 24 hours you should not: ° °Drive an automobile °Make any legal decisions °Drink any alcoholic beverage ° ° °You may resume regular meals tomorrow.  Today it is better to start with liquids and gradually work up to solid foods. ° °You may eat anything you prefer, but it is better to start with liquids, then soup and crackers, and gradually work up to solid foods. ° ° °Please notify your doctor immediately if you have any unusual bleeding, trouble breathing, redness and pain at the surgery site, drainage, fever, or pain not relieved by medication. ° ° ° °Additional Instructions: ° ° °Transurethral Resection of Bladder Tumor (TURBT) or Bladder Biopsy ° ° °Definition: ° Transurethral Resection of the Bladder Tumor is a surgical procedure used to diagnose and remove tumors within the bladder.  ° °General instructions: °   ° Your recent bladder surgery requires very little post hospital care but some definite precautions. ° °Despite the fact that no skin incisions were used, the area around the bladder incisions are raw and covered with scabs to promote healing and prevent bleeding. Certain precautions are needed to insure that the scabs are not disturbed over the next 2-4 weeks while the healing proceeds. ° °Because the raw surface inside your bladder and the irritating effects of urine you may expect frequency of urination and/or urgency (a stronger desire to urinate) and perhaps even getting up at night more often. This will usually resolve or improve slowly over the healing period. You may see some blood in your urine over the first 6 weeks. Do not be alarmed, even if the urine was clear for a while. Get off your feet and drink lots of fluids until clearing occurs. If you start to pass clots or don't improve call us. ° °Diet: ° °You may return to your normal diet immediately. Because  of the raw surface of your bladder, alcohol, spicy foods, foods high in acid and drinks with caffeine may cause irritation or frequency and should be used in moderation. To keep your urine flowing freely and avoid constipation, drink plenty of fluids during the day (8-10 glasses). Tip: Avoid cranberry juice because it is very acidic. ° °Activity: ° °Your physical activity doesn't need to be restricted. However, if you are very active, you may see some blood in the urine. We suggest that you reduce your activity under the circumstances until the bleeding has stopped. ° °Bowels: ° °It is important to keep your bowels regular during the postoperative period. Straining with bowel movements can cause bleeding. A bowel movement every other day is reasonable. Use a mild laxative if needed, such as milk of magnesia 2-3 tablespoons, or 2 Dulcolax tablets. Call if you continue to have problems. If you had been taking narcotics for pain, before, during or after your surgery, you may be constipated. Take a laxative if necessary. ° ° ° °Medication: ° °You should resume your pre-surgery medications unless told not to. In addition you may be given an antibiotic to prevent or treat infection. Antibiotics are not always necessary. All medication should be taken as prescribed until the bottles are finished unless you are having an unusual reaction to one of the drugs. ° ° °[facility] °[address], [address] °[place], [place] [address] °[phone] ° ° ° ° ° ° °Please contact your physician with any problems or Same   Day Surgery at [phone], Monday through Friday 6 am to 4 pm, or [place] at [place] Main number at [phone].  °

## 2022-11-15 NOTE — Anesthesia Preprocedure Evaluation (Signed)
Anesthesia Evaluation  Patient identified by MRN, date of birth, ID band Patient awake    Reviewed: Allergy & Precautions, H&P , NPO status , Patient's Chart, lab work & pertinent test results, reviewed documented beta blocker date and time   Airway Mallampati: II   Neck ROM: full    Dental  (+) Poor Dentition   Pulmonary asthma , sleep apnea , pneumonia, COPD, former smoker   Pulmonary exam normal        Cardiovascular hypertension, + angina with exertion + CAD, + Past MI, + Peripheral Vascular Disease and +CHF  Normal cardiovascular exam+ dysrhythmias + Valvular Problems/Murmurs  Rhythm:regular Rate:Normal     Neuro/Psych  Headaches  Anxiety      Neuromuscular disease  negative psych ROS   GI/Hepatic negative GI ROS, Neg liver ROS,,,  Endo/Other  diabetes    Renal/GU Renal disease  negative genitourinary   Musculoskeletal   Abdominal   Peds  Hematology negative hematology ROS (+)   Anesthesia Other Findings Past Medical History: No date: (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 06/2019 Echo (in setting of COVID): EF 35-40%, mild               LVH, g1 DD, glob HK; b. 09/2019 Echo: EF 50-55%, Gr1 DD;               c. 03/2021 Echo: EF 50-55%, no rwma, mild LVH, GrI DD, nl               RV fxn. RVSP 44.21mmHg. Mild-mod dil LA. Triv MR. Mild-mod              Ao sclerosis. No date: Actinic keratosis No date: Anginal pain No date: Aortic ectasia, abdominal     Comment:  a.) CT abd 01/01/2022: 2.9 cm infrarenal abdominal               aortic ectasia 2023: Bladder cancer 09/06/2007: BPH (benign prostatic hyperplasia) No date: CAD s/p CABG     Comment:  a.) 1990 s/p MI--> CABG x 3; b.) LHC/PCI 2002 --> BMS               (unk type) to LCX; c.) LHC 07/07/2015: 50% oLM-LM, 80%               oRCA, 80% pRCA-1, 80% pRCA-2, 60% dLAD, 100% o-pLAD, 60%               mLAD, 50% D2, LIMA-LAD,  SVG-D2, SVG-RI --> med Rx; d.) MV              06/05/2020: fixed apical ant/apical defect. No sign               ischemia; e.) MV 09/08/202: EF 52%, no ischemia. No date: Carotid arterial disease     Comment:  a.) 08/2016 Carotid U/S: <39% bilat; b.) carotid doppler               10/12/2022: mild-to-moderate (<50%) bilateral bifurcation              plaque. No date: CHF (congestive heart failure) 05/09/2008: Chronic prostatitis No date: CKD (chronic kidney disease), stage III 06/05/2017: Community acquired pneumonia of right lower lobe of lung No date: Diverticulosis 10/29/2008: Dupuytren's contracture of right hand 1998: DVT (deep venous thrombosis) 07/09/2008: Elevated prostate specific antigen (PSA) No date: Emphysema of lung 04/30/2007: GERD No date: Heart murmur 05/20/2019: History of 2019 novel coronavirus  disease (COVID-19) No date: History of hiatal hernia No date: History of shingles 04/26/2007: HLD (hyperlipidemia) 04/30/2007: HTN (hypertension) 03/08/2018: Laceration of skin of left hand No date: LBBB (left bundle branch block) No date: Lumbar disc disease with radiculopathy No date: Migraines 1990: Myocardial infarction     Comment:  a.) resulted in 3v CABG 2002: Myocardial infarction     Comment:  a.) second cardiac event; PCI with placement of BMS               (unknown type) to LCx No date: Neuropathy of both feet No date: OSA on CPAP No date: Osteoarthritis No date: PAD (peripheral artery disease)     Comment:  a. 07/2017 LE duplex: RSFA 75-46m, LSFA 75-36m, 50-74d;               c. 08/2018 Periph Angio: No signif AoIliac dzs. Mod-sev               Ca2+ RSFA w/ diff dzs throughout- 3 vessel runoff.               Borderline signif LSFA dzs w/ mod-sev Ca2+ vessels and 3               vessel runoff below the knee-->Med rx. 07/2014: Pneumonia 05/30/2019: Pneumonia due to COVID-19 virus     Comment:  a.) required hospitilization No date: Pre-diabetes No date:  Prostatitis, chronic 05/26/2010: Right inguinal hernia 1990: S/P CABG x 3     Comment:  a.) LIMA-LAD, SVG-D2, SVG-RI No date: Spinal stenosis of lumbar region Past Surgical History: 09/12/2018: ABDOMINAL AORTOGRAM W/LOWER EXTREMITY; [name]     Comment:  Procedure: ABDOMINAL AORTOGRAM W/LOWER EXTREMITY;                Surgeon: Iran Ouch, MD;  [facility]: [facility]              [facility];  Service: Cardiovascular;  Laterality: [name]; 07/11/2022: ANTERIOR CERVICAL DECOMP/DISCECTOMY FUSION; [name]     Comment:  Anterior Cervical Decompression Fusion ,Interboy               Prothesis,Plate/Screws , Cervical four-five,Cervical               five-six [name], Tinnie Gens, MD) No date: APPENDECTOMY     Comment:  rupture No date: BACK SURGERY     Comment:  1984 and then [facility] 10/19/2021: BLADDER INSTILLATION; [name]     Comment:  Procedure: BLADDER INSTILLATION OF GEMCITABINE;                Surgeon: Riki Altes, MD;  Location: [facility] ORS;                Service: Urology;  Laterality: [name]; 11/15/2022: BLADDER INSTILLATION; [name]     Comment:  Procedure: BLADDER INSTILLATION OF GEMCITABINE;                Surgeon: Riki Altes, MD;  Location: [facility] ORS;                Service: Urology;  Laterality: [name]; 07/07/2015: CARDIAC CATHETERIZATION; [name]     Comment:  Procedure: Left Heart Cath and Cors/Grafts Angiography;               Surgeon: Antonieta Iba, MD;  Location: [facility] INVAS[facility]               CV LAB;  Service: Cardiovascular;  Laterality: [name]; 2009: CATARACT EXTRACTION, BILATERAL 12/2016: CERVICAL  SPINE SURGERY     Comment:  cervical stenosis [name]) 03/2010: COLONOSCOPY     Comment:  HP polyp, diverticulosis, rpt 10 yrs ([name]) 11/30/2000: CORONARY ANGIOPLASTY     Comment:  LCX 1990: CORONARY ARTERY BYPASS GRAFT     Comment:  3 vessel  12/23/2010: CYSTOSCOPY     Comment:  [facility] No date: JOINT REPLACEMENT No date: KNEE ARTHROSCOPY; Right     Comment:  x2 1986: LAMINOTOMY      Comment:  L5/S1 lumbar laminotomy for two ruptured discs/fusion 07/13/2016: LUMBAR LAMINECTOMY/DECOMPRESSION MICRODISCECTOMY; [name]     Comment:  Procedure: LUMBAR TWO-THREE, LUMBAR THREE-FOUR, LUMBAR               FOUR-FIVE LAMINECTOMY AND FORAMINOTOMY;  Surgeon: Tressie Stalker, MD;  Location: [facility];  Service: Neurosurgery;                Laterality: [name];  LAMINECTOMY AND FORAMINOTOMY L2-L3,               L3-L4,L4-L5 06/2020: LUMBAR SPINE SURGERY     Comment:  Bilateral redo               laminectomy/laminotomy/foraminotomies/medial facetectomy               to decompress the bilateral L4 and L5 nerve roots               [name]) [id number]: TOTAL HIP ARTHROPLASTY; Bilateral 03/06/2017: TOTAL KNEE ARTHROPLASTY; Right     Comment:  Procedure: RIGHT TOTAL KNEE ARTHROPLASTY;  Surgeon:               Ollen Gross, MD;  Location: [facility];  Service:               Orthopedics;  Laterality: Right; 10/19/2021: TRANSURETHRAL RESECTION OF BLADDER TUMOR; [name]     Comment:  Procedure: TRANSURETHRAL RESECTION OF BLADDER TUMOR               (TURBT);  Surgeon: Riki Altes, MD;  Location: [facility]               ORS;  Service: Urology;  Laterality: [name]; 11/15/2022: TRANSURETHRAL RESECTION OF BLADDER TUMOR; [name]     Comment:  Procedure: TRANSURETHRAL RESECTION OF BLADDER TUMOR               (TURBT);  Surgeon: Riki Altes, MD;  Location: [facility]               ORS;  Service: Urology;  Laterality: [name];   Reproductive/Obstetrics negative OB ROS                             Anesthesia Physical Anesthesia Plan  ASA: 4  Anesthesia Plan: General   Post-op Pain Management:    Induction:   PONV Risk Score and Plan:   Airway Management Planned:   Additional Equipment:   Intra-op Plan:   Post-operative Plan:   Informed Consent: I have reviewed the patients History and Physical, chart, labs and discussed the procedure including the risks, benefits and  alternatives for the proposed anesthesia with the patient or authorized representative who has indicated his/her understanding and acceptance.     Dental Advisory Given  Plan Discussed with: CRNA  Anesthesia Plan Comments:        Anesthesia Quick Evaluation

## 2022-11-15 NOTE — H&P (Signed)
Urology H&P  Urologic history: 1.  Ta high-grade urothelial carcinoma bladder Incidentally noted on pelvic MRI ordered by orthopedics to have a 15 x 20 mm bladder mass near right UO; cystoscopy with 3 cm papillary tumor TURBT 10/19/2025 with high-grade tumor and a low-grade tumor left posterolateral wall Induction BCG x6 completed 01/20/2022 Maintenance BCG x 3: 05/23/2022 MR urogram 03/2022 showed no upper tract abnormalities   2.  BPH with LUTS Cystoscopy moderate lateral lobe enlargement/bladder neck elevation On tamsulosin  History of Present Illness: [name] is a 82 y.o. followed for urothelial carcinoma the bladder.  Recent surveillance cystoscopy remarkable for an area of early papillary change lateral to the right ureteral orifice.  He presents for TURBT.  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 06/2019 Echo (in setting of COVID): EF 35-40%, mild LVH, g1 DD, glob HK; b. 09/2019 Echo: EF 50-55%, Gr1 DD; c. 03/2021 Echo: EF 50-55%, no rwma, mild LVH, GrI DD, nl RV fxn. RVSP 44.85mmHg. Mild-mod dil LA. Triv MR. Mild-mod Ao sclerosis.   Actinic keratosis    Anginal pain    Aortic ectasia, abdominal    a.) CT abd 01/01/2022: 2.9 cm infrarenal abdominal aortic ectasia   Bladder cancer 2023   BPH (benign prostatic hyperplasia) 09/06/2007   CAD s/p CABG    a.) 1990 s/p MI--> CABG x 3; b.) LHC/PCI 2002 --> BMS (unk type) to LCX; c.) LHC 07/07/2015: 50% oLM-LM, 80% oRCA, 80% pRCA-1, 80% pRCA-2, 60% dLAD, 100% o-pLAD, 60% mLAD, 50% D2, LIMA-LAD, SVG-D2, SVG-RI --> med Rx; d.) MV 06/05/2020: fixed apical ant/apical defect. No sign ischemia; e.) MV 09/08/202: EF 52%, no ischemia.   Carotid arterial disease    a.) 08/2016 Carotid U/S: <39% bilat; b.) carotid doppler 10/12/2022: mild-to-moderate (<50%) bilateral bifurcation plaque.   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   CKD (chronic kidney  disease), stage III    Community acquired pneumonia of right lower lobe of lung 06/05/2017   Diverticulosis    Dupuytren's contracture of right hand 10/29/2008   DVT (deep venous thrombosis) 1998   Elevated prostate specific antigen (PSA) 07/09/2008   Emphysema of lung    GERD 04/30/2007   Heart murmur    History of 2019 novel coronavirus disease (COVID-19) 05/20/2019   History of hiatal hernia    History of shingles    HLD (hyperlipidemia) 04/26/2007   HTN (hypertension) 04/30/2007   Laceration of skin of left hand 03/08/2018   LBBB (left bundle branch block)    Lumbar disc disease with radiculopathy    Migraines    Myocardial infarction 1990   a.) resulted in 3v CABG   Myocardial infarction 2002   a.) second cardiac event; PCI with placement of BMS (unknown type) to LCx   Neuropathy of both feet    OSA on CPAP    Osteoarthritis    PAD (peripheral artery disease)    a. 07/2017 LE duplex: RSFA 75-55m, LSFA 75-45m, 50-74d; c. 08/2018 Periph Angio: No signif AoIliac dzs. Mod-sev Ca2+ RSFA w/ diff dzs throughout- 3 vessel runoff. Borderline signif LSFA dzs w/ mod-sev Ca2+ vessels and 3 vessel runoff below the knee-->Med rx.   Pneumonia 07/2014   Pneumonia due to COVID-19 virus 05/30/2019   a.) required hospitilization   Pre-diabetes    Prostatitis, chronic    Right inguinal hernia 05/26/2010   S/P CABG x 3 1990  a.) LIMA-LAD, SVG-D2, SVG-RI   Spinal stenosis of lumbar region     Past Surgical History:  Procedure Laterality Date   ABDOMINAL AORTOGRAM W/LOWER EXTREMITY [name] 09/12/2018   Procedure: ABDOMINAL AORTOGRAM W/LOWER EXTREMITY;  Surgeon: [name], MD;  Location: [facility];  Service: Cardiovascular;  Laterality: [name];   ANTERIOR CERVICAL DECOMP/DISCECTOMY FUSION [name] 07/11/2022   Anterior Cervical Decompression Fusion ,Interboy Prothesis,Plate/Screws , Cervical four-five,Cervical five-six [name], Dellis Filbert, MD)   APPENDECTOMY     rupture   [place] and then another one [facility]   BLADDER INSTILLATION [name] 10/19/2021   Procedure: BLADDER INSTILLATION OF GEMCITABINE;  Surgeon: Abbie Sons, MD;  Location: [facility];  Service: Urology;  Laterality: [name];   CARDIAC CATHETERIZATION [name] 07/07/2015   Procedure: Left Heart Cath and Cors/Grafts Angiography;  Surgeon: [name], MD;  Location: [facility];  Service: Cardiovascular;  Laterality: [name];   CATARACT EXTRACTION, BILATERAL  2009   CERVICAL SPINE SURGERY  12/2016   cervical stenosis [name])   COLONOSCOPY  03/2010   HP polyp, diverticulosis, rpt 10 yrs ([name])   CORONARY ANGIOPLASTY  11/30/2000   LCX   CORONARY ARTERY BYPASS GRAFT  1990   3 vessel    CYSTOSCOPY  12/23/2010   [facility]   JOINT REPLACEMENT     KNEE ARTHROSCOPY Right    x2   LAMINOTOMY  1986   L5/S1 lumbar laminotomy for two ruptured discs/fusion   LUMBAR LAMINECTOMY/DECOMPRESSION MICRODISCECTOMY [name] 07/13/2016   Procedure: LUMBAR TWO-THREE, LUMBAR THREE-FOUR, LUMBAR FOUR-FIVE LAMINECTOMY AND FORAMINOTOMY;  Surgeon: [name], MD;  Location: [facility];  Service: Neurosurgery;  Laterality: [name];  LAMINECTOMY AND FORAMINOTOMY L2-L3, L3-L4,L4-L5   LUMBAR SPINE SURGERY  06/2020   Bilateral redo laminectomy/laminotomy/foraminotomies/medial facetectomy to decompress the bilateral L4 and L5 nerve roots [name])   TOTAL HIP ARTHROPLASTY Bilateral [id number]   TOTAL KNEE ARTHROPLASTY Right 03/06/2017   Procedure: RIGHT TOTAL KNEE ARTHROPLASTY;  Surgeon: Gaynelle Arabian, MD;  Location: [facility];  Service: Orthopedics;  Laterality: Right;   TRANSURETHRAL RESECTION OF BLADDER TUMOR [name] 10/19/2021   Procedure: TRANSURETHRAL RESECTION OF BLADDER TUMOR (TURBT);  Surgeon: Abbie Sons, MD;  Location: [facility];  Service: Urology;  Laterality: [name];    Home Medications:  Current Meds  Medication Sig   albuterol (PROVENTIL) (2.5 MG/3ML) 0.083% nebulizer solution Take 3 mLs (2.5 mg total) by nebulization every 6  (six) hours as needed for wheezing or shortness of breath.   albuterol (VENTOLIN HFA) 108 (90 Base) MCG/ACT inhaler TAKE 2 PUFFS INTO LUNGS EVERY 6 HOURS ASNEEDED FOR WHEEZING OR SHORTNESS OF BREATH   aspirin EC 81 MG EC tablet Take 1 tablet (81 mg total) by mouth daily.   atorvastatin (LIPITOR) 20 MG tablet TAKE 1 TABLET BY MOUTH DAILY.   ezetimibe (ZETIA) 10 MG tablet Take 1 tablet (10 mg total) by mouth daily.   famotidine (PEPCID) 20 MG tablet Take 20 mg by mouth daily.   fluticasone (FLONASE) 50 MCG/ACT nasal spray Place 1 spray into both nostrils daily as needed for allergies or rhinitis.   Fluticasone-Umeclidin-Vilant (TRELEGY ELLIPTA) 200-62.5-25 MCG/ACT AEPB Inhale 1 puff into the lungs daily.   gabapentin (NEURONTIN) 600 MG tablet Take 1 tablet (600 mg total) by mouth 3 (three) times daily.   isosorbide mononitrate (IMDUR) 30 MG 24 hr tablet Take 1 tablet (30 mg total) by mouth daily.   losartan (COZAAR) 25 MG tablet TAKE 1 TABLET BY MOUTH  DAILY   ranolazine (RANEXA) 500 MG 12 hr tablet TAKE ONE (1) TABLET BY MOUTH TWO TIMES PER DAY   silodosin (RAPAFLO) 8 MG CAPS capsule Take 1 capsule (8 mg total) by mouth daily with breakfast.    Allergies:  Allergies  Allergen Reactions   Vioxx [Rofecoxib] Other (See Comments)    Hemorrhage    Spiriva Respimat [Tiotropium Bromide Monohydrate] Other (See Comments)    Elevated bp   Contrast Media [Iodinated Contrast Media] Itching and Rash    Delayed reaction post abdominal aortagram.    Morphine Nausea Only and Other (See Comments)    Irritability     Family History  Problem Relation Age of Onset   Stroke Mother    Heart attack Mother    Diabetes Mother    Stroke Father    Heart disease Father    Polycystic kidney disease Father    Cancer Sister        throat   Polycystic kidney disease Sister    Diabetes Brother        1/2 brother   Cancer Other        5/7 nephews with lung cancer    Social History:  reports that he quit  smoking about 34 years ago. His smoking use included cigarettes. He has a 25.00 pack-year smoking history. He has been exposed to tobacco smoke. He has never used smokeless tobacco. He reports that he does not drink alcohol and does not use drugs.  ROS: Noncontributory except as per the HPI  Physical Exam:  Vital signs in last 24 hours: Temp:  [97.6 F (36.4 C)] 97.6 F (36.4 C) (04/02 0737) Pulse Rate:  [77] 77 (04/02 0737) Resp:  [16] 16 (04/02 0737) BP: (143)/(76) 143/76 (04/02 0737) SpO2:  [96 %] 96 % (04/02 0737) Constitutional:  Alert and oriented, No acute distress HEENT: [place] AT Cardiovascular: Regular rate and rhythm Respiratory: Normal respiratory effort, lungs clear bilaterally    Laboratory Data:  No results for input(s): "WBC", "HGB", "HCT" in the last 72 hours. No results for input(s): "NA", "K", "CL", "CO2", "GLUCOSE", "BUN", "CREATININE", "CALCIUM" in the last 72 hours. No results for input(s): "LABPT", "INR" in the last 72 hours. No results for input(s): "LABURIN" in the last 72 hours. Results for orders placed or performed in visit on 11/07/22  CULTURE, URINE COMPREHENSIVE     Status: Abnormal   Collection Time: 11/07/22  4:18 PM   Specimen: Urine   UR  Result Value Ref Range Status   Urine Culture, Comprehensive Final report (A)  Final   Organism ID, Bacteria Enterococcus faecalis (A)  Final    Comment: For Enterococcus species, aminoglycosides (except for high-level resistance screening), cephalosporins, clindamycin, and trimethoprim-sulfamethoxazole are not effective clinically. (CLSI, M100-S26, 2016) 3,000 Colonies/mL    Organism ID, Bacteria Not applicable  Final   ANTIMICROBIAL SUSCEPTIBILITY Comment  Final    Comment:       ** S = Susceptible; I = Intermediate; R = Resistant **                    P = Positive; N = Negative             MICS are expressed in micrograms per mL    Antibiotic                 RSLT#1    RSLT#2    RSLT#3     RSLT#4 Ciprofloxacin  S Levofloxacin                   S Nitrofurantoin                 S Penicillin                     S Tetracycline                   R Vancomycin                     S   Microscopic Examination     Status: None   Collection Time: 11/07/22  4:18 PM   Urine  Result Value Ref Range Status   WBC, UA 0-5 0 - 5 /hpf Final   RBC, Urine 0-2 0 - 2 /hpf Final   Epithelial Cells (non renal) 0-10 0 - 10 /hpf Final   Bacteria, UA Few None seen/Few Final   *Note: Due to a large number of results and/or encounters for the requested time period, some results have not been displayed. A complete set of results can be found in Results Review.     Impression/Plan:   1.  Recurrent bladder tumor Presents for TURBT.  The procedure has been discussed in detail as per the previous cystoscopy note.  All questions were answered and he desires to proceed    11/15/2022, 8:44 AM  John Giovanni,  MD

## 2022-11-15 NOTE — Interval H&P Note (Signed)
History and Physical Interval Note:  11/15/2022 8:47 AM  [name]  has presented today for surgery, with the diagnosis of Bladder Tumor.  The various methods of treatment have been discussed with the patient and family. After consideration of risks, benefits and other options for treatment, the patient has consented to  Procedure(s): TRANSURETHRAL RESECTION OF BLADDER TUMOR (TURBT) ([name]) BLADDER INSTILLATION OF GEMCITABINE ([name]) as a surgical intervention.  The patient's history has been reviewed, patient examined, no change in status, stable for surgery.  I have reviewed the patient's chart and labs.  Questions were answered to the patient's satisfaction.     [place]

## 2022-11-15 NOTE — Op Note (Signed)
   Preoperative diagnosis: Bladder tumor (1 cm)  Postoperative diagnosis:  Bladder tumor (1 cm)  Procedure:  Cystoscopy Transurethral resection of bladder tumor (<2 cm) Bladder biopsy with fulguration Instillation intravesical gemcitabine  Surgeon: Nicki Reaper C. Schylar Allard, M.D.  Anesthesia: General  Complications: None  Intraoperative findings:  Cystoscopy-urethra normal in caliber without stricture.  Prostate with moderate lateral lobe enlargement.  UOs normal-appearing bilaterally.  Lateral to right UO is a 1 cm raised, erythematous lesion with early papillary change consistent with recurrent urothelial carcinoma.  Small papillary tumor left lateral wall measured at ~ 3 mm  EBL: Minimal  Specimens: Tumor lateral to right UO Base of tumor lateral to right UO Tumor left lateral wall      Indication: [name] is a 82 y.o. 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  On recent surveillance cystoscopy was found to have recurrent tumor lateral to right UO.  After reviewing the management options for treatment, he elected to proceed with the above surgical procedure(s). We have discussed the potential benefits and risks of the procedure, side effects of the proposed treatment, the likelihood of the patient achieving the goals of the procedure, and any potential problems that might occur during the procedure or recuperation. Informed consent has been obtained.  Description of procedure:  The patient was taken to the operating room and general anesthesia was induced.  The patient was placed in the dorsal lithotomy position, prepped and draped in the usual sterile fashion, and preoperative antibiotics were administered. A preoperative time-out was performed.   A 26 French continuous-flow resectoscope sheath with visual obturator was lubricated,passed per urethra and advanced under direct vision into the  bladder.  The obturator was replaced with an Mulberry with loop.  Panendoscopy was performed with findings as described above  Using bipolar loop cautery resection, the entire tumor lateral to the right UO was resected and removed for permanent pathologic analysis.   The base of the tumor was then biopsied with cold vision forceps.  The biopsy site was then fulgurated with the loop.  The right UO was not involved and was effluxing clear urine  The left lateral wall tumor was removed in toto with rigid biopsy forceps and the biopsy site and surrounding mucosa was then fulgurated with the bipolar loop.  The bladder was emptied and reinspected with no further bleeding noted at the end of the procedure.  A 16 French Foley catheter was placed with return of clear effluent upon irrigation.  The patient appeared to tolerate the procedure well and without complications.  After anesthetic reversal he was transported to the PACU in stable condition.  2000 mg of intravesical gemcitabine was instilled to the bladder.  This was allowed to dwell in the PACU for 1 hour.  This was well-tolerated.  After 1 hour, the catheter was drained and removed.  Plan: Will contact with pathology results and further follow-up recommendations once the pathology is reviewed   Callie Bunyard C. Bernardo Heater,  MD

## 2022-11-15 NOTE — Transfer of Care (Signed)
Immediate Anesthesia Transfer of Care Note  Patient: [name]  Procedure(s) Performed: TRANSURETHRAL RESECTION OF BLADDER TUMOR (TURBT) (Bladder) BLADDER INSTILLATION OF GEMCITABINE (Bladder)  Patient Location: PACU  Anesthesia Type:General  Level of Consciousness: awake and alert   Airway & Oxygen Therapy: Patient Spontanous Breathing and Patient connected to face mask oxygen  Post-op Assessment: Report given to RN and Post -op Vital signs reviewed and stable  Post vital signs: Reviewed and stable  Last Vitals:  Vitals Value Taken Time  BP 147/74 11/15/22 0948  Temp    Pulse 70 11/15/22 0949  Resp 16 11/15/22 0949  SpO2 100 % 11/15/22 0949  Vitals shown include unvalidated device data.  Last Pain:  Vitals:   11/15/22 0737  TempSrc: Temporal  PainSc: 3          Complications: No notable events documented.

## 2022-11-16 ENCOUNTER — Encounter: Payer: Self-pay | Admitting: Dermatology

## 2022-11-16 ENCOUNTER — Ambulatory Visit (INDEPENDENT_AMBULATORY_CARE_PROVIDER_SITE_OTHER): Payer: HMO | Admitting: Dermatology

## 2022-11-16 DIAGNOSIS — Z1283 Encounter for screening for malignant neoplasm of skin: Secondary | ICD-10-CM

## 2022-11-16 DIAGNOSIS — L814 Other melanin hyperpigmentation: Secondary | ICD-10-CM

## 2022-11-16 DIAGNOSIS — D229 Melanocytic nevi, unspecified: Secondary | ICD-10-CM

## 2022-11-16 DIAGNOSIS — L821 Other seborrheic keratosis: Secondary | ICD-10-CM

## 2022-11-16 DIAGNOSIS — L578 Other skin changes due to chronic exposure to nonionizing radiation: Secondary | ICD-10-CM

## 2022-11-16 DIAGNOSIS — D0461 Carcinoma in situ of skin of right upper limb, including shoulder: Secondary | ICD-10-CM | POA: Diagnosis not present

## 2022-11-16 DIAGNOSIS — D492 Neoplasm of unspecified behavior of bone, soft tissue, and skin: Secondary | ICD-10-CM

## 2022-11-16 DIAGNOSIS — C4492 Squamous cell carcinoma of skin, unspecified: Secondary | ICD-10-CM

## 2022-11-16 DIAGNOSIS — D1801 Hemangioma of skin and subcutaneous tissue: Secondary | ICD-10-CM

## 2022-11-16 HISTORY — DX: Squamous cell carcinoma of skin, unspecified: C44.92

## 2022-11-16 NOTE — Patient Instructions (Addendum)
Wound Care Instructions  Cleanse wound gently with soap and water once a day then pat dry with clean gauze. Apply a thin coat of Petrolatum (petroleum jelly, "Vaseline") over the wound (unless you have an allergy to this). We recommend that you use a new, sterile tube of Vaseline. Do not pick or remove scabs. Do not remove the yellow or white "healing tissue" from the base of the wound.  Cover the wound with fresh, clean, nonstick gauze and secure with paper tape. You may use Band-Aids in place of gauze and tape if the wound is small enough, but would recommend trimming much of the tape off as there is often too much. Sometimes Band-Aids can irritate the skin.  You should call the office for your biopsy report after 1 week if you have not already been contacted.  If you experience any problems, such as abnormal amounts of bleeding, swelling, significant bruising, significant pain, or evidence of infection, please call the office immediately.  FOR ADULT SURGERY PATIENTS: If you need something for pain relief you may take 1 extra strength Tylenol (acetaminophen) AND 2 Ibuprofen (200mg each) together every 4 hours as needed for pain. (do not take these if you are allergic to them or if you have a reason you should not take them.) Typically, you may only need pain medication for 1 to 3 days.     Due to recent changes in healthcare laws, you may see results of your pathology and/or laboratory studies on MyChart before the doctors have had a chance to review them. We understand that in some cases there may be results that are confusing or concerning to you. Please understand that not all results are received at the same time and often the doctors may need to interpret multiple results in order to provide you with the best plan of care or course of treatment. Therefore, we ask that you please give us 2 business days to thoroughly review all your results before contacting the office for clarification. Should  we see a critical lab result, you will be contacted sooner.   If You Need Anything After Your Visit  If you have any questions or concerns for your doctor, please call our main line at [phone] and press option 4 to reach your doctor's medical assistant. If no one answers, please leave a voicemail as directed and we will return your call as soon as possible. Messages left after 4 pm will be answered the following business day.   You may also send us a message via MyChart. We typically respond to MyChart messages within 1-2 business days.  For prescription refills, please ask your pharmacy to contact our office. Our fax number is [phone].  If you have an urgent issue when the clinic is closed that cannot wait until the next business day, you can page your doctor at the number below.    Please note that while we do our best to be available for urgent issues outside of office hours, we are not available 24/7.   If you have an urgent issue and are unable to reach us, you may choose to seek medical care at your doctor's office, retail clinic, urgent care center, or emergency room.  If you have a medical emergency, please immediately call 911 or go to the emergency department.  Pager Numbers  - Dr. Kowalski: [phone]  - Dr. Moye: [phone]  - Dr. Stewart: [phone]  In the event of inclement weather, please call our main line at   [phone] for an update on the status of any delays or closures.  Dermatology Medication Tips: Please keep the boxes that topical medications come in in order to help keep track of the instructions about where and how to use these. Pharmacies typically print the medication instructions only on the boxes and not directly on the medication tubes.   If your medication is too expensive, please contact our office at [phone] option 4 or send us a message through MyChart.   We are unable to tell what your co-pay for medications will be in  advance as this is different depending on your insurance coverage. However, we may be able to find a substitute medication at lower cost or fill out paperwork to get insurance to cover a needed medication.   If a prior authorization is required to get your medication covered by your insurance company, please allow us 1-2 business days to complete this process.  Drug prices often vary depending on where the prescription is filled and some pharmacies may offer cheaper prices.  The website www.goodrx.com contains coupons for medications through different pharmacies. The prices here do not account for what the cost may be with help from insurance (it may be cheaper with your insurance), but the website can give you the price if you did not use any insurance.  - You can print the associated coupon and take it with your prescription to the pharmacy.  - You may also stop by our office during regular business hours and pick up a GoodRx coupon card.  - If you need your prescription sent electronically to a different pharmacy, notify our office through [facility] MyChart or by phone at [phone] option 4.     Si Usted Necesita Algo Despus de Su Visita  Tambin puede enviarnos un mensaje a travs de MyChart. Por lo general respondemos a los mensajes de MyChart en el transcurso de 1 a 2 das hbiles.  Para renovar recetas, por favor pida a su farmacia que se ponga en contacto con nuestra oficina. Nuestro nmero de fax es el [phone].  Si tiene un asunto urgente cuando la clnica est cerrada y que no puede esperar hasta el siguiente da hbil, puede llamar/localizar a su doctor(a) al nmero que aparece a continuacin.   Por favor, tenga en cuenta que aunque hacemos todo lo posible para estar disponibles para asuntos urgentes fuera del horario de oficina, no estamos disponibles las 24 horas del da, los 7 das de la semana.   Si tiene un problema urgente y no puede comunicarse con nosotros, puede  optar por buscar atencin mdica  en el consultorio de su doctor(a), en una clnica privada, en un centro de atencin urgente o en una sala de emergencias.  Si tiene una emergencia mdica, por favor llame inmediatamente al 911 o vaya a la sala de emergencias.  Nmeros de bper  - Dr. Kowalski: [phone]  - Dra. Moye: [phone]  - Dra. Stewart: [phone]  En caso de inclemencias del tiempo, por favor llame a nuestra lnea principal al [phone] para una actualizacin sobre el estado de cualquier retraso o cierre.  Consejos para la medicacin en dermatologa: Por favor, guarde las cajas en las que vienen los medicamentos de uso tpico para ayudarle a seguir las instrucciones sobre dnde y cmo usarlos. Las farmacias generalmente imprimen las instrucciones del medicamento slo en las cajas y no directamente en los tubos del medicamento.   Si su medicamento es muy caro, por favor, pngase en contacto con   nuestra oficina llamando al [phone] y presione la opcin 4 o envenos un mensaje a travs de MyChart.   No podemos decirle cul ser su copago por los medicamentos por adelantado ya que esto es diferente dependiendo de la cobertura de su seguro. Sin embargo, es posible que podamos encontrar un medicamento sustituto a menor costo o llenar un formulario para que el seguro cubra el medicamento que se considera necesario.   Si se requiere una autorizacin previa para que su compaa de seguros cubra su medicamento, por favor permtanos de 1 a 2 das hbiles para completar este proceso.  Los precios de los medicamentos varan con frecuencia dependiendo del lugar de dnde se surte la receta y alguna farmacias pueden ofrecer precios ms baratos.  El sitio web www.goodrx.com tiene cupones para medicamentos de diferentes farmacias. Los precios aqu no tienen en cuenta lo que podra costar con la ayuda del seguro (puede ser ms barato con su seguro), pero el sitio web puede darle el  precio si no utiliz ningn seguro.  - Puede imprimir el cupn correspondiente y llevarlo con su receta a la farmacia.  - Tambin puede pasar por nuestra oficina durante el horario de atencin regular y recoger una tarjeta de cupones de GoodRx.  - Si necesita que su receta se enve electrnicamente a una farmacia diferente, informe a nuestra oficina a travs de MyChart de [facility] o por telfono llamando al [phone] y presione la opcin 4.  

## 2022-11-16 NOTE — Anesthesia Postprocedure Evaluation (Signed)
Anesthesia Post Note  Patient: [name]  Procedure(s) Performed: TRANSURETHRAL RESECTION OF BLADDER TUMOR (TURBT) (Bladder) BLADDER INSTILLATION OF GEMCITABINE (Bladder)  Patient location during evaluation: PACU Anesthesia Type: General Level of consciousness: awake and alert Pain management: pain level controlled Vital Signs Assessment: post-procedure vital signs reviewed and stable Respiratory status: spontaneous breathing, nonlabored ventilation, respiratory function stable and patient connected to nasal cannula oxygen Cardiovascular status: blood pressure returned to baseline and stable Postop Assessment: no apparent nausea or vomiting Anesthetic complications: no   No notable events documented.   Last Vitals:  Vitals:   11/15/22 1045 11/15/22 1057  BP: 110/64 (!) 121/53  Pulse: (!) 58 70  Resp: (!) 9 18  Temp:  (!) 36.3 C  SpO2: 95% 94%    Last Pain:  Vitals:   11/15/22 1057  TempSrc: Temporal  PainSc: 0-No pain                 Molli Barrows

## 2022-11-16 NOTE — Progress Notes (Signed)
Follow-Up Visit   Subjective  [name] is a 82 y.o. male who presents for the following: Skin Cancer Screening and Full Body Skin Exam, hx of precancers.  The patient presents for Total-Body Skin Exam (TBSE) for skin cancer screening and mole check. The patient has spots, moles and lesions to be evaluated, some may be new or changing and the patient has concerns that these could be cancer.  The following portions of the chart were reviewed this encounter and updated as appropriate: medications, allergies, medical history  Review of Systems:  No other skin or systemic complaints except as noted in HPI or Assessment and Plan.  Objective  Well appearing patient in no apparent distress; mood and affect are within normal limits.  A full examination was performed including scalp, head, eyes, ears, nose, lips, neck, chest, axillae, abdomen, back, buttocks, bilateral upper extremities, bilateral lower extremities, hands, feet, fingers, toes, fingernails, and toenails. All findings within normal limits unless otherwise noted below.   Relevant physical exam findings are noted in the Assessment and Plan.  right mid dorsum forearm 0.8 cm hyperkeratotic papule     Assessment & Plan   LENTIGINES, SEBORRHEIC KERATOSES, HEMANGIOMAS - Benign normal skin lesions - Benign-appearing - Call for any changes  MELANOCYTIC NEVI - Tan-brown and/or pink-flesh-colored symmetric macules and papules - Benign appearing on exam today - Observation - Call clinic for new or changing moles - Recommend daily use of broad spectrum spf 30+ sunscreen to sun-exposed areas.   ACTINIC DAMAGE - Chronic condition, secondary to cumulative UV/sun exposure - diffuse scaly erythematous macules with underlying dyspigmentation - Recommend daily broad spectrum sunscreen SPF 30+ to sun-exposed areas, reapply every 2 hours as needed.  - Staying in the shade or wearing long sleeves, sun glasses (UVA+UVB protection) and  wide brim hats (4-inch brim around the entire circumference of the hat) are also recommended for sun protection.  - Call for new or changing lesions.  BLADDER CANCER  SKIN CANCER SCREENING PERFORMED TODAY.  Neoplasm of skin right mid dorsum forearm  Epidermal / dermal shaving  Lesion diameter (cm):  0.8 Informed consent: discussed and consent obtained   Timeout: patient name, date of birth, surgical site, and procedure verified   Procedure prep:  Patient was prepped and draped in usual sterile fashion Prep type:  Isopropyl alcohol Anesthesia: the lesion was anesthetized in a standard fashion   Anesthetic:  1% lidocaine w/ epinephrine 1-100,000 buffered w/ 8.4% NaHCO3 Hemostasis achieved with: pressure, aluminum chloride and electrodesiccation   Outcome: patient tolerated procedure well   Post-procedure details: sterile dressing applied and wound care instructions given   Dressing type: bandage and petrolatum    Destruction of lesion  Destruction method: electrodesiccation and curettage   Informed consent: discussed and consent obtained   Timeout:  patient name, date of birth, surgical site, and procedure verified Anesthesia: the lesion was anesthetized in a standard fashion   Anesthetic:  1% lidocaine w/ epinephrine 1-100,000 buffered w/ 8.4% NaHCO3 Curettage performed in three different directions: Yes   Electrodesiccation performed over the curetted area: Yes   Curettage cycles:  3 Lesion length (cm):  0.8 Lesion width (cm):  0.8 Margin per side (cm):  0.2 Final wound size (cm):  1.2 Hemostasis achieved with:  electrodesiccation Outcome: patient tolerated procedure well with no complications   Post-procedure details: sterile dressing applied and wound care instructions given   Dressing type: petrolatum    Specimen 1 - Surgical pathology Differential Diagnosis: R/O Skin  cancer   Check Margins: No   Return in about 1 year (around 11/16/2023) for TBSE.  IMarye Round, CMA, am acting as scribe for Sarina Ser, MD .   Documentation: I have reviewed the above documentation for accuracy and completeness, and I agree with the above.  Sarina Ser, MD

## 2022-11-17 ENCOUNTER — Other Ambulatory Visit (HOSPITAL_COMMUNITY): Payer: Self-pay

## 2022-11-17 DIAGNOSIS — H43813 Vitreous degeneration, bilateral: Secondary | ICD-10-CM | POA: Diagnosis not present

## 2022-11-17 DIAGNOSIS — H34232 Retinal artery branch occlusion, left eye: Secondary | ICD-10-CM | POA: Diagnosis not present

## 2022-11-18 LAB — SURGICAL PATHOLOGY

## 2022-11-20 ENCOUNTER — Telehealth: Payer: Self-pay | Admitting: Urology

## 2022-11-20 NOTE — Telephone Encounter (Signed)
I contacted [name] at 3:45 on 11/18/2022 to discuss his bladder pathology result.  The tumor lateral to the right UO was noninvasive (Ta) urothelial carcinoma.  The base of the tumor showed muscle which was not involved.  The left lateral wall tumor was a low-grade urothelial carcinoma.  Findings were discussed with the recommendation of a 6-week course of reinduction BCG.  He is scheduled for follow-up 11/24/2022 and will discuss further at this visit.

## 2022-11-21 DIAGNOSIS — M4722 Other spondylosis with radiculopathy, cervical region: Secondary | ICD-10-CM | POA: Diagnosis not present

## 2022-11-21 DIAGNOSIS — Z6829 Body mass index (BMI) 29.0-29.9, adult: Secondary | ICD-10-CM | POA: Diagnosis not present

## 2022-11-22 ENCOUNTER — Ambulatory Visit: Payer: HMO | Attending: Pulmonary Disease

## 2022-11-22 DIAGNOSIS — I272 Pulmonary hypertension, unspecified: Secondary | ICD-10-CM

## 2022-11-22 DIAGNOSIS — H3412 Central retinal artery occlusion, left eye: Secondary | ICD-10-CM

## 2022-11-22 LAB — ECHOCARDIOGRAM COMPLETE
AR max vel: 1.84 cm2
AV Area VTI: 2.02 cm2
AV Area mean vel: 1.94 cm2
AV Mean grad: 12 mmHg
AV Peak grad: 21 mmHg
Ao pk vel: 2.29 m/s
Area-P 1/2: 4.21 cm2
Calc EF: 36.5 %
S' Lateral: 4.4 cm
Single Plane A2C EF: 25 %
Single Plane A4C EF: 45.7 %

## 2022-11-23 ENCOUNTER — Telehealth: Payer: Self-pay

## 2022-11-23 NOTE — Telephone Encounter (Signed)
Advised patient of results/hd  

## 2022-11-23 NOTE — Telephone Encounter (Signed)
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

## 2022-11-24 ENCOUNTER — Ambulatory Visit (INDEPENDENT_AMBULATORY_CARE_PROVIDER_SITE_OTHER): Payer: HMO | Admitting: Urology

## 2022-11-24 ENCOUNTER — Encounter: Payer: Self-pay | Admitting: Urology

## 2022-11-24 VITALS — BP 132/71 | HR 64 | Ht 72.0 in | Wt 206.0 lb

## 2022-11-24 DIAGNOSIS — C679 Malignant neoplasm of bladder, unspecified: Secondary | ICD-10-CM

## 2022-11-24 NOTE — Progress Notes (Signed)
I, DeAsia L Maxie,acting as a scribe for Riki Altes, MD.,have documented all relevant documentation on the behalf of Riki Altes, MD,as directed by  Riki Altes, MD while in the presence of Riki Altes, MD.   Marcelle Overlie Plume,acting as a scribe for Riki Altes, MD.,have documented all relevant documentation on the behalf of Riki Altes, MD,as directed by  Riki Altes, MD while in the presence of Riki Altes, MD.  11/24/2022 10:28 AM   Ardelia Mems Feb 09, 1941 [id number]  Referring provider: Eustaquio Boyden, MD [address] [place],  [place] [address]  Chief Complaint  Patient presents with   Follow-up   Other   Urologic history: 1.  Ta high-grade urothelial carcinoma bladder Incidentally noted on pelvic MRI ordered by orthopedics to have a 15 x 20 mm bladder mass near right UO; cystoscopy with 3 cm papillary tumor TURBT 10/19/2025 with high-grade tumor and a low-grade tumor left posterolateral wall Induction BCG x6 completed 01/20/2022 Maintenance BCG x 3: 05/23/2022 MR urogram 03/2022 showed no upper tract abnormalities   2.  BPH with LUTS Cystoscopy moderate lateral lobe enlargement/bladder neck elevation On tamsulosin  HPI: [name] is a 82 y.o. male who presents today for a review of a recent bladder pathology report.   Status post TURBT 11/15/2022 for recurrent papillary lesion, lateral to the right UO. Interoperative findings remarkable for a smaller papillary lesion on the left lateral wall. The right side resected tumor showed high-grade, non-invasive urothelial carcinoma of the bladder. The left sided tumor was low-grade urothelial carcinoma of the bladder. 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 06/2019 Echo (in setting of  COVID): EF 35-40%, mild LVH, g1 DD, glob HK; b. 09/2019 Echo: EF 50-55%, Gr1 DD; c. 03/2021 Echo: EF 50-55%, no rwma, mild LVH, GrI DD, nl RV fxn. RVSP 44.44mmHg. Mild-mod dil LA. Triv MR. Mild-mod Ao sclerosis.   Actinic keratosis    Anginal pain    Aortic ectasia, abdominal    a.) CT abd 01/01/2022: 2.9 cm infrarenal abdominal aortic ectasia   Bladder cancer 2023   BPH (benign prostatic hyperplasia) 09/06/2007   CAD s/p CABG    a.) 1990 s/p MI--> CABG x 3; b.) LHC/PCI 2002 --> BMS (unk type) to LCX; c.) LHC 07/07/2015: 50% oLM-LM, 80% oRCA, 80% pRCA-1, 80% pRCA-2, 60% dLAD, 100% o-pLAD, 60% mLAD, 50% D2, LIMA-LAD, SVG-D2, SVG-RI --> med Rx; d.) MV 06/05/2020: fixed apical ant/apical defect. No sign ischemia; e.) MV 09/08/202: EF 52%, no ischemia.   Carotid arterial disease    a.) 08/2016 Carotid U/S: <39% bilat; b.) carotid doppler 10/12/2022: mild-to-moderate (<50%) bilateral bifurcation plaque.   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   CKD (chronic kidney disease), stage III    Community acquired pneumonia of right lower lobe of lung 06/05/2017   Diverticulosis    Dupuytren's contracture of right hand 10/29/2008   DVT (deep venous thrombosis) 1998   Elevated prostate specific antigen (PSA) 07/09/2008   Emphysema of lung    GERD 04/30/2007   Heart murmur    History of 2019 novel coronavirus disease (COVID-19) 05/20/2019   History of hiatal hernia    History of shingles    HLD (hyperlipidemia) 04/26/2007   HTN (hypertension)  04/30/2007   Laceration of skin of left hand 03/08/2018   LBBB (left bundle branch block)    Lumbar disc disease with radiculopathy    Migraines    Myocardial infarction 1990   a.) resulted in 3v CABG   Myocardial infarction 2002   a.) second cardiac event; PCI with placement of BMS (unknown type) to LCx   Neuropathy of both feet    OSA on CPAP    Osteoarthritis    PAD (peripheral artery disease)    a. 07/2017 LE duplex: RSFA  75-69m, LSFA 75-52m, 50-74d; c. 08/2018 Periph Angio: No signif AoIliac dzs. Mod-sev Ca2+ RSFA w/ diff dzs throughout- 3 vessel runoff. Borderline signif LSFA dzs w/ mod-sev Ca2+ vessels and 3 vessel runoff below the knee-->Med rx.   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   Right mid dorsum forearm - in situ - [facility]    Surgical History: Past Surgical History:  Procedure Laterality Date   ABDOMINAL AORTOGRAM W/LOWER EXTREMITY [name] 09/12/2018   Procedure: ABDOMINAL AORTOGRAM W/LOWER EXTREMITY;  Surgeon: Iran Ouch, MD;  [facility]: [facility];  Service: Cardiovascular;  Laterality: [name];   ANTERIOR CERVICAL DECOMP/DISCECTOMY FUSION [name] 07/11/2022   Anterior Cervical Decompression Fusion ,Interboy Prothesis,Plate/Screws , Cervical four-five,Cervical five-six [name], Tinnie Gens, MD)   APPENDECTOMY     rupture   BACK SURGERY     1984 and then another one [facility]   BLADDER INSTILLATION [name] 10/19/2021   Procedure: BLADDER INSTILLATION OF GEMCITABINE;  Surgeon: Riki Altes, MD;  Location: [facility] ORS;  Service: Urology;  Laterality: [name];   BLADDER INSTILLATION [name] 11/15/2022   Procedure: BLADDER INSTILLATION OF GEMCITABINE;  Surgeon: Riki Altes, MD;  Location: [facility] OR[facility];  Service: Urology;  Laterality: [name];   CARDIAC CATHETERIZATION [name] 07/07/2015   Procedure: Left Heart Cath and Cors/Grafts Angiography;  Surgeon: Antonieta Iba, MD;  Location: [facility] CV [facility];  Service: Cardiovascular;  Laterality: [name];   CATARACT EXTRACTION, BILATERAL  2009   CERVICAL SPINE SURGERY  12/2016   cervical stenosis [name])   COLONOSCOPY  03/2010   HP polyp, diverticulosis, rpt 10 yrs ([name])   CORONARY ANGIOPLASTY  11/30/2000    LCX   CORONARY ARTERY BYPASS GRAFT  1990   3 vessel    CYSTOSCOPY  12/23/2010   [facility]   JOINT REPLACEMENT     KNEE ARTHROSCOPY Right    x2   LAMINOTOMY  1986   L5/S1 lumbar laminotomy for two ruptured discs/fusion   LUMBAR LAMINECTOMY/DECOMPRESSION MICRODISCECTOMY [name] 07/13/2016   Procedure: LUMBAR TWO-THREE, LUMBAR THREE-FOUR, LUMBAR FOUR-FIVE LAMINECTOMY AND FORAMINOTOMY;  Surgeon: Tressie Stalker, MD;  Location: [facility];  Service: Neurosurgery;  Laterality: [name];  LAMINECTOMY AND FORAMINOTOMY L2-L3, L3-L4,L4-L5   LUMBAR SPINE SURGERY  06/2020   Bilateral redo laminectomy/laminotomy/foraminotomies/medial facetectomy to decompress the bilateral L4 and L5 nerve roots [name])   TOTAL HIP ARTHROPLASTY Bilateral [id number]   TOTAL KNEE ARTHROPLASTY Right 03/06/2017   Procedure: RIGHT TOTAL KNEE ARTHROPLASTY;  Surgeon: Ollen Gross, MD;  Location: [facility];  Service: Orthopedics;  Laterality: Right;   TRANSURETHRAL RESECTION OF BLADDER TUMOR [name] 10/19/2021   Procedure: TRANSURETHRAL RESECTION OF BLADDER TUMOR (TURBT);  Surgeon: Irineo Axon  C, MD;  Location: [facility];  Service: Urology;  Laterality: [name];   TRANSURETHRAL RESECTION OF BLADDER TUMOR [name] 11/15/2022   Procedure: TRANSURETHRAL RESECTION OF BLADDER TUMOR (TURBT);  Surgeon: Riki Altes, MD;  Location: [facility];  Service: Urology;  Laterality: [name];    Home Medications:  Allergies as of 11/24/2022       Reactions   Vioxx [rofecoxib] Other (See Comments)   Hemorrhage    Spiriva Respimat [tiotropium Bromide Monohydrate] Other (See Comments)   Elevated bp   Contrast Media [iodinated Contrast Media] Itching, Rash   Delayed reaction post abdominal aortagram.    Morphine Nausea Only, Other (See Comments)   Irritability        Medication List        Accurate as of November 24, 2022 10:28 AM. If you have any questions, ask your nurse or doctor.          albuterol (2.5 MG/3ML) 0.083% nebulizer solution Commonly known as:  PROVENTIL Take 3 mLs (2.5 mg total) by nebulization every 6 (six) hours as needed for wheezing or shortness of breath.   albuterol 108 (90 Base) MCG/ACT inhaler Commonly known as: VENTOLIN HFA TAKE 2 PUFFS INTO LUNGS EVERY 6 HOURS ASNEEDED FOR WHEEZING OR SHORTNESS OF BREATH   aspirin EC 81 MG tablet Take 1 tablet (81 mg total) by mouth daily.   atorvastatin 20 MG tablet Commonly known as: LIPITOR TAKE 1 TABLET BY MOUTH DAILY.   Azelastine HCl 0.15 % Soln Commonly known as: Astepro Place 1 spray into the nose at bedtime as needed (Sinus congestion, allergies).   cyclobenzaprine 5 MG tablet Commonly known as: FLEXERIL Take 1 tablet (5 mg total) by mouth 3 (three) times daily as needed for muscle spasms.   docusate sodium 100 MG capsule Commonly known as: COLACE Take 1 capsule (100 mg total) by mouth 2 (two) times daily.   ezetimibe 10 MG tablet Commonly known as: ZETIA Take 1 tablet (10 mg total) by mouth daily.   famotidine 20 MG tablet Commonly known as: PEPCID Take 20 mg by mouth daily.   fluticasone 50 MCG/ACT nasal spray Commonly known as: FLONASE Place 1 spray into both nostrils daily as needed for allergies or rhinitis.   furosemide 20 MG tablet Commonly known as: LASIX Take 1 tablet by mouth every third day as directed.   gabapentin 600 MG tablet Commonly known as: NEURONTIN Take 1 tablet (600 mg total) by mouth 3 (three) times daily.   guaiFENesin-codeine 100-10 MG/5ML syrup Commonly known as: ROBITUSSIN AC Take 5 mLs by mouth 3 (three) times daily as needed for cough.   HYDROcodone-acetaminophen 5-325 MG tablet Commonly known as: NORCO/VICODIN Take 1-2 tablets by mouth every 4 (four) hours as needed for moderate pain.   isosorbide mononitrate 30 MG 24 hr tablet Commonly known as: IMDUR Take 1 tablet (30 mg total) by mouth daily.   losartan 25 MG tablet Commonly known as: COZAAR TAKE 1 TABLET BY MOUTH DAILY   nitroGLYCERIN 0.4 MG SL  tablet Commonly known as: NITROSTAT Place 1 tablet (0.4 mg total) under the tongue every 5 (five) minutes as needed for chest pain.   polyethylene glycol 17 g packet Commonly known as: MIRALAX / GLYCOLAX Take 17 g by mouth daily as needed for mild constipation.   ranolazine 500 MG 12 hr tablet Commonly known as: RANEXA TAKE ONE (1) TABLET BY MOUTH TWO TIMES PER DAY   silodosin 8 MG Caps capsule Commonly known as: RAPAFLO Take 1  capsule (8 mg total) by mouth daily with breakfast.   Trelegy Ellipta 200-62.5-25 MCG/ACT Aepb Generic drug: Fluticasone-Umeclidin-Vilant Inhale 1 puff into the lungs daily.   Trelegy Ellipta 200-62.5-25 MCG/ACT Aepb Generic drug: Fluticasone-Umeclidin-Vilant Inhale 1 puff into the lungs daily.        Allergies:  Allergies  Allergen Reactions   Vioxx [Rofecoxib] Other (See Comments)    Hemorrhage    Spiriva Respimat [Tiotropium Bromide Monohydrate] Other (See Comments)    Elevated bp   Contrast Media [Iodinated Contrast Media] Itching and Rash    Delayed reaction post abdominal aortagram.    Morphine Nausea Only and Other (See Comments)    Irritability     Family History: Family History  Problem Relation Age of Onset   Stroke Mother    Heart attack Mother    Diabetes Mother    Stroke Father    Heart disease Father    Polycystic kidney disease Father    Cancer Sister        throat   Polycystic kidney disease Sister    Diabetes Brother        1/2 brother   Cancer Other        5/7 nephews with lung cancer    Social History:  reports that he quit smoking about 34 years ago. His smoking use included cigarettes. He has a 25.00 pack-year smoking history. He has been exposed to tobacco smoke. He has never used smokeless tobacco. He reports that he does not drink alcohol and does not use drugs.   Physical Exam: BP 132/71   Pulse 64   Ht 6' (1.829 m)   Wt 206 lb (93.4 kg)   BMI 27.94 kg/m   Constitutional:  Alert and oriented, No  acute distress. HEENT: [place] AT, moist mucus membranes.  Trachea midline, no masses. Cardiovascular: No clubbing, cyanosis, or edema. Respiratory: Normal respiratory effort, no increased work of breathing. GI: Abdomen is soft, nontender, nondistended, no abdominal masses Skin: No rashes, bruises or suspicious lesions. Neurologic: Grossly intact, no focal deficits, moving all 4 extremities. Psychiatric: Normal mood and affect.   Assessment & Plan:    1. 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   Riki AltesScott C Zsazsa Bahena, MD  [facility] [address], [address] [place], [place] [address] [phone]

## 2022-12-01 ENCOUNTER — Other Ambulatory Visit: Payer: Self-pay | Admitting: Urology

## 2022-12-06 ENCOUNTER — Ambulatory Visit (INDEPENDENT_AMBULATORY_CARE_PROVIDER_SITE_OTHER): Payer: HMO | Admitting: Pulmonary Disease

## 2022-12-06 ENCOUNTER — Encounter: Payer: Self-pay | Admitting: Pulmonary Disease

## 2022-12-06 VITALS — BP 118/78 | HR 58 | Temp 97.6°F | Ht 72.0 in | Wt 206.0 lb

## 2022-12-06 DIAGNOSIS — J4489 Other specified chronic obstructive pulmonary disease: Secondary | ICD-10-CM

## 2022-12-06 DIAGNOSIS — J209 Acute bronchitis, unspecified: Secondary | ICD-10-CM

## 2022-12-06 DIAGNOSIS — G4733 Obstructive sleep apnea (adult) (pediatric): Secondary | ICD-10-CM | POA: Diagnosis not present

## 2022-12-06 DIAGNOSIS — I429 Cardiomyopathy, unspecified: Secondary | ICD-10-CM | POA: Diagnosis not present

## 2022-12-06 DIAGNOSIS — J4540 Moderate persistent asthma, uncomplicated: Secondary | ICD-10-CM

## 2022-12-06 MED ORDER — AZITHROMYCIN 250 MG PO TABS: ORAL_TABLET | ORAL | 0 refills | Status: AC

## 2022-12-06 NOTE — Progress Notes (Signed)
Subjective:    Patient ID: [name], male    DOB: 01-06-1941, 82 y.o.   MRN: [id number] Patient Care Team: Eustaquio Boyden, MD as PCP - General (Family Medicine) Mariah Milling Tollie Pizza, MD as PCP - Cardiology (Cardiology) Phil Dopp, [facility] as Pharmacist (Pharmacist) Antonieta Iba, MD as Consulting Physician (Cardiology) Salena Saner, MD as Consulting Physician (Pulmonary Disease) Coralyn Helling, MD as Consulting Physician (Pulmonary Disease)  Chief Complaint  Patient presents with   Follow-up    SOB with exertion. Cough with yellow sputum. Occasional wheezing.   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 Patient was last seen on 04 October 2021. He he is maintained on Trelegy Ellipta 200 which he states has helped him the most. He is compliant with his CPAP.  Currently he does not endorse any fevers, chills or sweats. Since he has been able to use Trelegy daily he has not had a cough of note.  No recent use of rescue inhaler.  He does need nebulizer solution to have handy in case of need.  Has not had chest pain, no lower extremity edema or calf tenderness.  He has noted over the last 2 to 3 days that his cough is starting to change from yellow to slight pale green.  Dyspnea is at baseline no worsening.   CPAP compliance download shows 100% use and 29/30 days with over 4 hours use (97%).  Median pressure between 7 to 12 cm H2O.  Residual AHI 4.6.  3 minutes of Cheyne-Stokes respiration noted.  Patient states that since he switched to nasal pillows his CPAP has been more comfortable.  He tells me today that he has had a recurrence of his cancer.  He had TURBT 15 November 2022 for recurrent papillary lesion.  He is being followed by urology.  Review of the urology notes shows that he has a recurrent high-grade urothelial carcinoma  of the bladder.  He is to have reinduction with BCG.  He had an echocardiogram performed on 22 November 2022 showing LVEF of 40 to 45% with mild decrease left ventricular function.  Left ventricle demonstrates global hypokinesis.  Right ventricular systolic function is normal.  There is mildly elevated pulmonary artery systolic pressure, left atrial size mildly dilated.  Mild mitral valve regurgitation, there is mild aortic stenosis.  His EF has decreased somewhat from prior.  Review of Systems A 10 point review of systems was performed and it is as noted above otherwise negative.  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   Anxiety 06/04/2019   Chronic right shoulder pain 09/10/2018   Cervical stenosis of spinal canal 06/05/2017   Pedal edema 06/05/2017   Health maintenance examination 12/06/2016   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/13/2016   Overweight (BMI 25.0-29.9) 07/04/2016  Low vitamin B12 level 01/01/2016   Exertional dyspnea 07/07/2015   Medicare annual wellness visit, subsequent 07/03/2015   Advanced care planning/counseling discussion 07/03/2015   Type 2 diabetes mellitus with other specified complication 05/04/2015   Pleuritic chest pain 05/04/2015   Ex-smoker 05/04/2015   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   Spermatocele 04/02/2013   Syncopal vertigo  11/04/2010   Lumbar disc disease with radiculopathy 11/04/2010   CAD, ARTERY BYPASS GRAFT 08/11/2009   PAD (peripheral artery disease) (HCC) 08/11/2009   DUPUYTREN'S CONTRACTURE, 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   Carotid stenosis 07/09/2008   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   Benign prostatic hyperplasia with urinary obstruction 09/06/2007   Essential hypertension 04/30/2007   GERD 04/30/2007   Hyperlipidemia associated with type 2 diabetes mellitus 04/26/2007   OA (osteoarthritis) of knee 04/26/2007   DVT, HX OF 04/26/2007   Social History   Tobacco Use   Smoking status: Former    Packs/day: 1.00    Years: 25.00    Additional pack years: 0.00    Total pack years: 25.00    Types: Cigarettes    Quit date: 08/15/1988    Years since quitting: 34.3    Passive exposure: Past   Smokeless tobacco: Never  Substance Use Topics   Alcohol use: No    Alcohol/week: 0.0 standard drinks of alcohol   Allergies  Allergen Reactions   Vioxx [Rofecoxib] Other (See Comments)    Hemorrhage    Spiriva Respimat [Tiotropium Bromide Monohydrate] Other (See Comments)    Elevated bp   Contrast Media [Iodinated Contrast Media] Itching and Rash    Delayed reaction post abdominal aortagram.    Morphine Nausea Only and Other (See Comments)    Irritability    Current Meds  Medication Sig   albuterol (PROVENTIL) (2.5 MG/3ML) 0.083% nebulizer solution Take 3 mLs (2.5 mg total) by nebulization every 6 (six) hours as needed for wheezing or shortness of breath.   albuterol (VENTOLIN HFA) 108 (90 Base) MCG/ACT inhaler TAKE 2 PUFFS INTO LUNGS EVERY 6 HOURS ASNEEDED FOR WHEEZING OR SHORTNESS OF BREATH   aspirin EC 81 MG EC tablet Take 1 tablet (81 mg total) by mouth daily.   atorvastatin (LIPITOR) 20 MG tablet TAKE 1 TABLET BY MOUTH DAILY.   Azelastine HCl (ASTEPRO) 0.15 % SOLN Place 1 spray into the nose at bedtime as needed (Sinus congestion, allergies).   azithromycin (ZITHROMAX) 250 MG tablet Take 2 tablets (500 mg) on   Day 1,  followed by 1 tablet (250 mg) once daily on Days 2 through 5.   cyclobenzaprine (FLEXERIL) 5 MG tablet Take 1 tablet (5 mg total) by mouth 3 (three) times daily as needed for muscle spasms.   docusate sodium (COLACE) 100 MG capsule Take 1 capsule (100 mg total) by mouth 2 (two) times daily.   ezetimibe (ZETIA) 10 MG tablet Take 1 tablet (10 mg total) by mouth daily.   famotidine (PEPCID) 20 MG tablet Take 20 mg by mouth daily.   fluticasone (FLONASE) 50 MCG/ACT nasal spray Place 1 spray into both nostrils daily as needed for allergies or rhinitis.   Fluticasone-Umeclidin-Vilant (TRELEGY ELLIPTA) 200-62.5-25 MCG/ACT AEPB Inhale 1 puff into the lungs daily.   Fluticasone-Umeclidin-Vilant (TRELEGY ELLIPTA) 200-62.5-25 MCG/ACT AEPB Inhale 1 puff into the lungs daily.   furosemide (LASIX) 20 MG tablet Take 1 tablet by mouth every third day as directed.   gabapentin (NEURONTIN) 600 MG tablet Take 1 tablet (600 mg total) by mouth 3 (three) times daily.   guaiFENesin-codeine (ROBITUSSIN AC)  100-10 MG/5ML syrup Take 5 mLs by mouth 3 (three) times daily as needed for cough.   HYDROcodone-acetaminophen (NORCO/VICODIN) 5-325 MG tablet Take 1-2 tablets by mouth every 4 (four) hours as needed for moderate pain.   isosorbide mononitrate (IMDUR) 30 MG 24 hr tablet Take 1 tablet (30 mg total) by mouth daily.   losartan (COZAAR) 25 MG tablet TAKE 1 TABLET BY MOUTH DAILY   nitroGLYCERIN (NITROSTAT) 0.4 MG SL tablet Place 1 tablet (0.4 mg total) under the tongue every 5 (five) minutes as needed for chest pain.   polyethylene glycol (MIRALAX / GLYCOLAX) 17 g packet Take 17 g by mouth daily as needed for mild constipation.    ranolazine (RANEXA) 500 MG 12 hr tablet TAKE ONE (1) TABLET BY MOUTH TWO TIMES PER DAY   silodosin (RAPAFLO) 8 MG CAPS capsule TAKE ONE CAPSULE BY MOUTH DAILY WITH BREAKFAST.   Immunization History  Administered Date(s) Administered   Fluad Quad(high Dose 65+) 04/17/2019, 04/15/2022    Influenza Whole 08/15/2000, 07/04/2007, 05/09/2008, 05/31/2010   Influenza, High Dose Seasonal PF 05/15/2013, 06/25/2015, 07/05/2021   Influenza,inj,Quad PF,6+ Mos 04/17/2014, 05/18/2016, 06/07/2018   Influenza-Unspecified 05/22/2017, 05/28/2020   PFIZER(Purple Top)SARS-COV-2 Vaccination 09/07/2019, 09/28/2019, 05/12/2020, 01/19/2021   Pneumococcal Conjugate-13 02/09/2015   Pneumococcal Polysaccharide-23 08/15/2000, 10/29/2008   Td 08/15/2005   Tdap 02/10/2018   Zoster Recombinat (Shingrix) 04/05/2017, 07/25/2017   Zoster, Live 02/04/2009      Objective:   Physical Exam BP 118/78 (BP Location: Left Arm, Cuff Size: Large)   Pulse (!) 58   Temp 97.6 F (36.4 C)   Ht 6' (1.829 m)   Wt 206 lb (93.4 kg)   SpO2 98%   BMI 27.94 kg/m   SpO2: 98 % O2 Device: None (Room air)  GENERAL: This is a well-developed, overweight gentleman, chronically ill-appearing, awake, alert, no acute distress.  Fully ambulatory. HEAD: Normocephalic, atraumatic.  EYES: Pupils equal, round, reactive to light.  No scleral icterus.  MOUTH: Nose/mouth/throat not examined due to masking requirements per hospital guidelines. NECK: Supple. No thyromegaly. Trachea midline. No JVD.  No adenopathy.  Anterior cervical fusion incision, healed. PULMONARY: Good air entry bilaterally, coarse breath sounds otherwise, no adventitious sounds.  CARDIOVASCULAR: S1 and S2. Regular rate and rhythm.  Grade 1/6 systolic ejection murmur left sternal border. GASTROINTESTINAL: Protuberant abdomen, soft, no tenderness.   MUSCULOSKELETAL: No joint deformity, no clubbing, no edema.  NEUROLOGIC: No overt focal deficits noted.  Speech is fluent.   SKIN: Intact,warm,dry.  Limited exam shows no rashes.  He has multiple purpura and ecchymoses in the upper extremities. PSYCH: Mood and behavior normal.      Assessment & Plan:     ICD-10-CM   1. Moderate persistent asthma without complication  J45.40    Continue Trelegy Continue as  needed albuterol    2. Asthmatic bronchitis , chronic  J44.89    Management as above    3. Acute bronchitis, unspecified organism  J20.9    Azithromycin    4. OSA (obstructive sleep apnea)  G47.33    Compliant with CPAP Continue CPAP use    5. Cardiomyopathy, unspecified type  I42.9    EF has declined somewhat from prior Patient follows with cardiology Advised to continue follow-up     Meds ordered this encounter  Medications   azithromycin (ZITHROMAX) 250 MG tablet    Sig: Take 2 tablets (500 mg) on  Day 1,  followed by 1 tablet (250 mg) once daily on Days 2 through 5.  Dispense:  6 each    Refill:  0   Will see the patient in follow-up in 2 to 3 months time he is to contact us prior to that time should any new difficulties arise.  Gailen Shelter, MD Advanced Bronchoscopy PCCM [facility]    *This note was dictated using voice recognition software/Dragon.  Despite best efforts to proofread, errors can occur which can change the meaning. Any transcriptional errors that result from this process are unintentional and may not be fully corrected at the time of dictation.

## 2022-12-06 NOTE — Patient Instructions (Signed)
I think you have a low-grade bronchitis.  We sent an antibiotic to your pharmacy.  Follow the directions in the package.  Your compliance with the CPAP is very good.  Please let us know if you need any help with getting the Trelegy.  We will see you in follow-up in 2 to 3 months time call sooner should any new problems arise.

## 2022-12-15 ENCOUNTER — Telehealth: Payer: Self-pay | Admitting: Pulmonary Disease

## 2022-12-15 ENCOUNTER — Telehealth: Payer: Self-pay | Admitting: Cardiovascular Disease

## 2022-12-15 NOTE — Telephone Encounter (Signed)
Patient is requesting a call to further discuss echo results.

## 2022-12-15 NOTE — Telephone Encounter (Signed)
I spoke with the patient. He said he feels like the Trelegy is helping with his breathing but he read the side effects for it and he is having some of them. He wants to know your thoughts on if it is causing his problems He said he has had increased urinary incontinence. He is having to run to the restroom so he does not use the bathroom on himself. He is having increased SOB when walking.    He is scheduled to see Cardiology on Tues.

## 2022-12-15 NOTE — Telephone Encounter (Signed)
The urinary problems that Trelegy causes are actually retention of urine and not incontinence.  The incontinence is likely related to the recent urologic issues he is having and he needs to follow-up with his urologist on that.  The shortness of breath that he is experiencing with exertion is likely related to the fact that his heart function has dropped about 10% from prior.  He has a follow-up appointment with cardiology.  He needs to keep that appointment.  When I saw him last year recently he stated that the Trelegy was helping him.  I do not think that the issues that he is citing are due to the Trelegy.

## 2022-12-15 NOTE — Telephone Encounter (Signed)
-----   Message from Antonieta Iba, MD sent at 12/10/2022  5:50 PM EDT ----- Echocardiogram Ejection fraction appears to have had a mild drop down to 40 to 45%, previously 50 to 55% in 2022 Can we call to inquire whether he has had a change in symptoms, any recent illnesses In the past, had a drop in his ejection fraction on previous COVID infection but recovered Any chest pain concerning for angina? We could consider stress testing to look for any blockages

## 2022-12-15 NOTE — Telephone Encounter (Signed)
Reviewed results with patient and inquired about his symptoms. He reports feeling weak, shortness of breath, and some chest pain. We discussed strict ED precautions and scheduled him appointment to come in and see Dr. Mariah Milling next week. Advised that Dr. Mariah Milling will review results and determine if any additional testing is needed. He verbalized understanding of our conversation, agreement with plan, and had no further questions at this time.

## 2022-12-15 NOTE — Telephone Encounter (Signed)
Patient would like the nurse to call regarding his Trelegy.  Please call patient at [phone]

## 2022-12-15 NOTE — Telephone Encounter (Signed)
I have notified the patient. He will reach out to his Urologist and follow up with the Cardiologist on Tuesday.  Nothing further needed.

## 2022-12-16 ENCOUNTER — Ambulatory Visit
Admission: EM | Admit: 2022-12-16 | Discharge: 2022-12-16 | Disposition: A | Discharge status: Home / Self Care | Payer: HMO | Attending: Nurse Practitioner | Admitting: Nurse Practitioner

## 2022-12-16 DIAGNOSIS — S81812A Laceration without foreign body, left lower leg, initial encounter: Secondary | ICD-10-CM

## 2022-12-16 NOTE — ED Triage Notes (Signed)
Patient to Urgent Care with complaints of laceration present to left lower leg. Reports hitting his leg with an umbrella this evening. Bleeding well controlled with guaze dressing in place. Patient cleaned area with peroxide PTA.   Last TDAP 02/10/2018.

## 2022-12-16 NOTE — Discharge Instructions (Signed)
Keep wound clean and dry.  The glue and Steri-Strips used to close your wound will dissolve/fall off on their own in the next 5 days.  Do not scrub or soak the wound Monitor for any signs of infection which include redness, drainage, swelling, warmth and seek reevaluation if these occur Follow-up with your PCP as needed

## 2022-12-16 NOTE — ED Provider Notes (Signed)
[name]    CSN: [id number] Arrival date & time: 12/16/22  1929      History   Chief Complaint Chief Complaint  Patient presents with   Laceration    HPI [name] is a 82 y.o. male presents for evaluation of a laceration to his left lower leg. The laceration was caused when he fell. He is not on blood thinning medications. He leaned and dressed the wound at home.  Patient is with his tetanus. No numbness, weakness, swelling or excessive bleeding. No other injuries or concerns at this time.    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 06/2019 Echo (in setting of COVID): EF 35-40%, mild LVH, g1 DD, glob HK; b. 09/2019 Echo: EF 50-55%, Gr1 DD; c. 03/2021 Echo: EF 50-55%, no rwma, mild LVH, GrI DD, nl RV fxn. RVSP 44.91mmHg. Mild-mod dil LA. Triv MR. Mild-mod Ao sclerosis.   Actinic keratosis    Anginal pain (HCC)    Aortic ectasia, abdominal (HCC)    a.) CT abd 01/01/2022: 2.9 cm infrarenal abdominal aortic ectasia   Bladder cancer (HCC) 2023   BPH (benign prostatic hyperplasia) 09/06/2007   CAD s/p CABG    a.) 1990 s/p MI--> CABG x 3; b.) LHC/PCI 2002 --> BMS (unk type) to LCX; c.) LHC 07/07/2015: 50% oLM-LM, 80% oRCA, 80% pRCA-1, 80% pRCA-2, 60% dLAD, 100% o-pLAD, 60% mLAD, 50% D2, LIMA-LAD, SVG-D2, SVG-RI --> med Rx; d.) MV 06/05/2020: fixed apical ant/apical defect. No sign ischemia; e.) MV 09/08/202: EF 52%, no ischemia.   Carotid arterial disease (HCC)    a.) 08/2016 Carotid U/S: <39% bilat; b.) carotid doppler 10/12/2022: mild-to-moderate (<50%) bilateral bifurcation plaque.   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   CKD (chronic kidney disease), stage III (HCC)    Community acquired pneumonia of right lower lobe of lung 06/05/2017   Diverticulosis    Dupuytren's contracture of right hand 10/29/2008   DVT (deep venous thrombosis) (HCC) 1998    Elevated prostate specific antigen (PSA) 07/09/2008   Emphysema of lung (HCC)    GERD 04/30/2007   Heart murmur    History of 2019 novel coronavirus disease (COVID-19) 05/20/2019   History of hiatal hernia    History of shingles    HLD (hyperlipidemia) 04/26/2007   HTN (hypertension) 04/30/2007   Laceration of skin of left hand 03/08/2018   LBBB (left bundle branch block)    Lumbar disc disease with radiculopathy    Migraines    Myocardial infarction (HCC) 1990   a.) resulted in 3v CABG   Myocardial infarction (HCC) 2002   a.) second cardiac event; PCI with placement of BMS (unknown type) to LCx   Neuropathy of both feet    OSA on CPAP    Osteoarthritis    PAD (peripheral artery disease) (HCC)    a. 07/2017 LE duplex: RSFA 75-13m, LSFA 75-45m, 50-74d; c. 08/2018 Periph Angio: No signif AoIliac dzs. Mod-sev Ca2+ RSFA w/ diff dzs throughout- 3 vessel runoff. Borderline signif LSFA dzs w/ mod-sev Ca2+ vessels and 3 vessel runoff below the knee-->Med rx.   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    Coccydynia 09/20/2019   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   Anxiety 06/04/2019   Chronic right shoulder pain 09/10/2018   Cervical stenosis of spinal canal 06/05/2017   Pedal edema 06/05/2017   Health maintenance examination 12/06/2016   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/13/2016   Overweight (BMI 25.0-29.9) 07/04/2016   Low vitamin B12 level 01/01/2016   Exertional dyspnea 07/07/2015   Medicare annual wellness visit, subsequent 07/03/2015   Advanced care planning/counseling discussion 07/03/2015   Type 2 diabetes mellitus with other specified complication (HCC) 05/04/2015   Pleuritic chest pain 05/04/2015   Ex-smoker 05/04/2015   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   Spermatocele 04/02/2013   Syncopal vertigo 11/04/2010   Lumbar disc disease with radiculopathy 11/04/2010   CAD, ARTERY BYPASS GRAFT 08/11/2009   PAD (peripheral artery disease) (HCC) 08/11/2009   DUPUYTREN'S CONTRACTURE, 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   Carotid stenosis 07/09/2008   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   Benign prostatic hyperplasia with urinary obstruction 09/06/2007   Essential hypertension 04/30/2007   GERD 04/30/2007   Hyperlipidemia associated with type 2 diabetes mellitus (HCC) 04/26/2007   OA (osteoarthritis) of knee 04/26/2007   DVT, HX OF 04/26/2007    Past Surgical History:  Procedure Laterality Date   ABDOMINAL AORTOGRAM W/LOWER EXTREMITY [name] 09/12/2018   Procedure: ABDOMINAL AORTOGRAM W/LOWER EXTREMITY;  Surgeon: Iran Ouch, MD;  Location: [facility];  Service: Cardiovascular;  Laterality: [name];   ANTERIOR CERVICAL DECOMP/DISCECTOMY FUSION [name] 07/11/2022   Anterior Cervical Decompression Fusion ,Interboy Prothesis,Plate/Screws , Cervical four-five,Cervical five-six [name], Tinnie Gens, MD)   APPENDECTOMY     rupture   BACK SURGERY     1984 and then another one at [facility]   BLADDER INSTILLATION [name] 10/19/2021   Procedure: BLADDER INSTILLATION OF  GEMCITABINE;  Surgeon: Riki Altes, MD;  Location: [facility];  Service: Urology;  Laterality: [name];   BLADDER INSTILLATION [name] 11/15/2022   Procedure: BLADDER INSTILLATION OF GEMCITABINE;  Surgeon: Riki Altes, MD;  Location: [facility];  Service: Urology;  Laterality: [name];   CARDIAC CATHETERIZATION [name] 07/07/2015   Procedure: Left Heart Cath and Cors/Grafts Angiography;  Surgeon: Antonieta Iba, MD;  Location: [facility];  Service: Cardiovascular;  Laterality: [name];   CATARACT EXTRACTION, BILATERAL  2009   CERVICAL SPINE SURGERY  12/2016   cervical stenosis [name])   COLONOSCOPY  03/2010   HP polyp, diverticulosis, rpt 10 yrs ([name])   CORONARY ANGIOPLASTY  11/30/2000   LCX   CORONARY ARTERY BYPASS GRAFT  1990   3 vessel    CYSTOSCOPY  12/23/2010   [facility]   JOINT REPLACEMENT     KNEE ARTHROSCOPY Right    x2   LAMINOTOMY  1986   L5/S1 lumbar laminotomy for two ruptured discs/fusion  LUMBAR LAMINECTOMY/DECOMPRESSION MICRODISCECTOMY [name] 07/13/2016   Procedure: LUMBAR TWO-THREE, LUMBAR THREE-FOUR, LUMBAR FOUR-FIVE LAMINECTOMY AND FORAMINOTOMY;  Surgeon: Tressie Stalker, MD;  Location: [facility];  Service: Neurosurgery;  Laterality: [name];  LAMINECTOMY AND FORAMINOTOMY L2-L3, L3-L4,L4-L5   LUMBAR SPINE SURGERY  06/2020   Bilateral redo laminectomy/laminotomy/foraminotomies/medial facetectomy to decompress the bilateral L4 and L5 nerve roots [name])   TOTAL HIP ARTHROPLASTY Bilateral [id number]   TOTAL KNEE ARTHROPLASTY Right 03/06/2017   Procedure: RIGHT TOTAL KNEE ARTHROPLASTY;  Surgeon: Ollen Gross, MD;  Location: [facility];  Service: Orthopedics;  Laterality: Right;   TRANSURETHRAL RESECTION OF BLADDER TUMOR [name] 10/19/2021   Procedure: TRANSURETHRAL RESECTION OF BLADDER TUMOR (TURBT);  Surgeon: Riki Altes, MD;  Location: [facility];  Service: Urology;  Laterality: [name];   TRANSURETHRAL RESECTION OF BLADDER TUMOR [name] 11/15/2022   Procedure: TRANSURETHRAL RESECTION OF BLADDER  TUMOR (TURBT);  Surgeon: Riki Altes, MD;  Location: [facility];  Service: Urology;  Laterality: [name];       Home Medications    Prior to Admission medications   Medication Sig Start Date End Date Taking? Authorizing Provider  albuterol (PROVENTIL) (2.5 MG/3ML) 0.083% nebulizer solution Take 3 mLs (2.5 mg total) by nebulization every 6 (six) hours as needed for wheezing or shortness of breath. 07/29/22   Salena Saner, MD  albuterol (VENTOLIN HFA) 108 (90 Base) MCG/ACT inhaler TAKE 2 PUFFS INTO LUNGS EVERY 6 HOURS ASNEEDED FOR WHEEZING OR SHORTNESS OF BREATH 10/26/22   Eustaquio Boyden, MD  aspirin EC 81 MG EC tablet Take 1 tablet (81 mg total) by mouth daily. 06/26/19   Elgergawy, Leana Roe, MD  atorvastatin (LIPITOR) 20 MG tablet TAKE 1 TABLET BY MOUTH DAILY. 01/17/22   Antonieta Iba, MD  Azelastine HCl (ASTEPRO) 0.15 % SOLN Place 1 spray into the nose at bedtime as needed (Sinus congestion, allergies). 02/03/22   Coralyn Helling, MD  cyclobenzaprine (FLEXERIL) 5 MG tablet Take 1 tablet (5 mg total) by mouth 3 (three) times daily as needed for muscle spasms. 07/12/22   Tressie Stalker, MD  docusate sodium (COLACE) 100 MG capsule Take 1 capsule (100 mg total) by mouth 2 (two) times daily. 07/12/22   Tressie Stalker, MD  ezetimibe (ZETIA) 10 MG tablet Take 1 tablet (10 mg total) by mouth daily. 08/05/22   Antonieta Iba, MD  famotidine (PEPCID) 20 MG tablet Take 20 mg by mouth daily.    [provider]  fluticasone (FLONASE) 50 MCG/ACT nasal spray Place 1 spray into both nostrils daily as needed for allergies or rhinitis.    [provider]  Fluticasone-Umeclidin-Vilant (TRELEGY ELLIPTA) 200-62.5-25 MCG/ACT AEPB Inhale 1 puff into the lungs daily. 08/03/22   Salena Saner, MD  Fluticasone-Umeclidin-Vilant (TRELEGY ELLIPTA) 200-62.5-25 MCG/ACT AEPB Inhale 1 puff into the lungs daily. 10/04/22   Salena Saner, MD  furosemide (LASIX) 20 MG tablet Take 1 tablet  by mouth every third day as directed. 02/14/22   Antonieta Iba, MD  gabapentin (NEURONTIN) 600 MG tablet Take 1 tablet (600 mg total) by mouth 3 (three) times daily. 10/07/22   Eustaquio Boyden, MD  guaiFENesin-codeine [facility]) 100-10 MG/5ML syrup Take 5 mLs by mouth 3 (three) times daily as needed for cough. Patient not taking: Reported on 12/16/2022 07/20/22   [name] Beat, MD  HYDROcodone-acetaminophen (NORCO/VICODIN) 5-325 MG tablet Take 1-2 tablets by mouth every 4 (four) hours as needed for moderate pain. 07/12/22   Tressie Stalker, MD  isosorbide mononitrate (IMDUR) 30 MG 24  hr tablet Take 1 tablet (30 mg total) by mouth daily. 08/05/22   Antonieta Iba, MD  losartan (COZAAR) 25 MG tablet TAKE 1 TABLET BY MOUTH DAILY 11/01/22   Antonieta Iba, MD  nitroGLYCERIN (NITROSTAT) 0.4 MG SL tablet Place 1 tablet (0.4 mg total) under the tongue every 5 (five) minutes as needed for chest pain. 05/01/21   Eustaquio Boyden, MD  polyethylene glycol (MIRALAX / GLYCOLAX) 17 g packet Take 17 g by mouth daily as needed for mild constipation.     [provider]  ranolazine (RANEXA) 500 MG 12 hr tablet TAKE ONE (1) TABLET BY MOUTH TWO TIMES PER DAY 04/19/22   Antonieta Iba, MD  silodosin (RAPAFLO) 8 MG CAPS capsule TAKE ONE CAPSULE BY MOUTH DAILY WITH BREAKFAST. 12/01/22   Stoioff, Verna Czech, MD    Family History Family History  Problem Relation Age of Onset   Stroke Mother    Heart attack Mother    Diabetes Mother    Stroke Father    Heart disease Father    Polycystic kidney disease Father    Cancer Sister        throat   Polycystic kidney disease Sister    Diabetes Brother        1/2 brother   Cancer Other        5/7 nephews with lung cancer    Social History Social History   Tobacco Use   Smoking status: Former    Packs/day: 1.00    Years: 25.00    Additional pack years: 0.00    Total pack years: 25.00    Types: Cigarettes    Quit date: 08/15/1988    Years  since quitting: 34.3    Passive exposure: Past   Smokeless tobacco: Never  Vaping Use   Vaping Use: Never used  Substance Use Topics   Alcohol use: No    Alcohol/week: 0.0 standard drinks of alcohol   Drug use: No     Allergies   Vioxx [rofecoxib], Spiriva respimat [tiotropium bromide monohydrate], Contrast media [iodinated contrast media], and Morphine   Review of Systems Review of Systems  Skin:  Positive for wound.     Physical Exam Triage Vital Signs ED Triage Vitals  Enc Vitals Group     BP 12/16/22 1935 (!) 160/75     Pulse Rate 12/16/22 1935 91     Resp 12/16/22 1935 18     Temp 12/16/22 1935 97.9 F (36.6 C)     Temp src --      SpO2 12/16/22 1935 97 %     Weight --      Height --      Head Circumference --      Peak Flow --      Pain Score 12/16/22 1933 0     Pain Loc --      Pain Edu? --      Excl. in GC? --    No data found.  Updated Vital Signs BP (!) 160/75   Pulse 91   Temp 97.9 F (36.6 C)   Resp 18   SpO2 97%   Visual Acuity Right Eye Distance:   Left Eye Distance:   Bilateral Distance:    Right Eye Near:   Left Eye Near:    Bilateral Near:     Physical Exam Vitals and nursing note reviewed.  Constitutional:      Appearance: Normal appearance.  HENT:     Head: Normocephalic  and atraumatic.  Eyes:     Pupils: Pupils are equal, round, and reactive to light.  Cardiovascular:     Rate and Rhythm: Normal rate.  Pulmonary:     Effort: Pulmonary effort is normal.  Skin:    General: Skin is warm and dry.          Comments: 5cm linear, well approximated superficial laceration to the left lower leg. Bleeding controlled. No tendon involvement or FB.   Neurological:     General: No focal deficit present.     Mental Status: He is alert and oriented to person, place, and time.  Psychiatric:        Mood and Affect: Mood normal.        Behavior: Behavior normal.      UC Treatments / Results  Labs (all labs ordered are listed,  but only abnormal results are displayed) Labs Reviewed - No data to display  EKG   Radiology No results found.  Procedures Laceration Repair  Date/Time: 12/16/2022 7:59 PM  Performed by: Radford Pax, NP Authorized by: Radford Pax, NP   Consent:    Consent obtained:  Verbal   Consent given by:  Patient   Risks, benefits, and alternatives were discussed: yes     Risks discussed:  Infection, pain, need for additional repair, nerve damage, poor wound healing, poor cosmetic result, retained foreign body, tendon damage and vascular damage   Alternatives discussed:  Referral and no treatment Universal protocol:    Patient identity confirmed:  Verbally with patient Anesthesia:    Anesthesia method:  None Laceration details:    Location:  Leg   Leg location:  L lower leg   Length (cm):  5   Depth (mm):  0.2 Pre-procedure details:    Preparation:  Patient was prepped and draped in usual sterile fashion Exploration:    Wound exploration: wound explored through full range of motion   Treatment:    Area cleansed with:  Chlorhexidine   Amount of cleaning:  Standard Skin repair:    Repair method:  Steri-Strips and tissue adhesive Approximation:    Approximation:  Close Repair type:    Repair type:  Simple Post-procedure details:    Procedure completion:  Tolerated well, no immediate complications  (including critical care time)  Medications Ordered in UC Medications - No data to display  Initial Impression / Assessment and Plan / UC Course  I have reviewed the triage vital signs and the nursing notes.  Pertinent labs & imaging results that were available during my care of the patient were reviewed by me and considered in my medical decision making (see chart for details).     Wound care reviewed with patient.  Send symptoms of infection reviewed. Return precautions discussed with patient. PCP follow-up as needed ER precautions reviewed Final Clinical Impressions(s) /  UC Diagnoses   Final diagnoses:  Laceration of left lower extremity, initial encounter   Discharge Instructions   None    ED Prescriptions   None    PDMP not reviewed this encounter.   Radford Pax, NP 12/16/22 2000

## 2022-12-19 DIAGNOSIS — M25511 Pain in right shoulder: Secondary | ICD-10-CM | POA: Diagnosis not present

## 2022-12-19 DIAGNOSIS — M25562 Pain in left knee: Secondary | ICD-10-CM | POA: Diagnosis not present

## 2022-12-19 NOTE — Progress Notes (Unsigned)
Cardiology Office Note  Date:  12/20/2022   ID:  [name], DOB 1940-12-25, MRN [id number]  PCP:  Eustaquio Boyden, MD   Chief Complaint  Patient presents with   Follow up Echo results     Patient c/o chest pain, fatigue, shortness of breath with little to no exertion, gives out easily. Medications reviewed by the patient verbally.     HPI:  82 year old gentleman with hx of OSA, did not tolerate CPAP mild  bilateral carotid arterial disease  <39% bilaterally 08/2016 hyperlipidemia  coronary artery disease,  bypass in 1990,  stenting to the circumflex in 2002,  cath 06/2015, patent grafts x 2, RCA small vessel diffuse disease DVT in the left lower extremity,  Completed back surgery for spinal stenosis, 06/20/2016 Cervical neck surgery 01/2017, Kathlen Mody  total knee surgery in 02/2017 covid 05/20/2019 EF was 35 to 40% in 06/2019 in setting of covid 10/20 Repeat echo February 2021  ejection fraction 50 to 55% COPD/asthma who presents for routine followup  Of his coronary artery disease, post  Covid  Last seen by myself in clinic December 2023 Seen by Dr.Arida for PAD in October 2023, medical management recommended  Stress test 9/22, low risk  Getting over COPD exacerbation/bronchitis Reports she was treated with antibiotics During this time he had echocardiogram showing mildly depressed ejection fraction Echo 4/24: EF 40 to 45%,  down from 50 to 55% in 2022  Reports his breathing is better, cough improved Having some episodes of chest discomfort lasting for several minutes at a time Comes on at rest, thought it was indigestion Some chest pain on exertion Sedentary, light work outside  Carotid u/s Mild-to-moderate bilateral carotid bifurcation plaque with Doppler measurements indicative of less than 50% stenosis.   No new sx, breathing stable Lasix PRN, no significant leg swelling, trivial on the left  Had neck surgery, anterior cervical approach  Stable  asthma/copd, On CPAP with oxygen,  Labs reviewed CRI: CR 1.28, BUN 25, taking less lasix GFR 52  GERD: off PPI Started on tums and pepcid  Hx of Bladder cancer, completed 6 treatments  EKG personally reviewed by myself on todays visit Normal sinus rhythm rate 80 bpm left bundle branch block  Stress test September 2022 Low risk, probably normal pharmacologic myocardial perfusion stress test.  LE arterial hx, reviewed today 2.  Right lower extremity: Moderately to severely calcified SFA with diffuse disease throughout its course especially in the midsegment and three-vessel runoff below the knee. 3.  Left lower extremity: Borderline significant SFA disease which is moderately to severely calcified and three-vessel runoff below the knee.  pulmonary appointment sept, 2021  postinflammatory pulmonary issues post Covid COPD poorly compensated Changes to  his inhalers made  CT chest 06/2019 Diffuse multifocal areas of consolidation and ground-glass opacity throughout the lungs compatible with atypical infection in the setting of known COVID positivity.  Stress test 03/2019 Pharmacological myocardial perfusion imaging study with no significant ischemia Small region mild fixed perfusion defect in the distal anteroseptal and apical region, unable to exclude old scar vs attenuation artifact EF estimated at 38%, septal wall hypokinesis, possibly secondary to CABG/post-operative state.  No EKG changes concerning for ischemia at peak stress or in recovery. Low risk scan  05/2019 COVID , and hospital 10 days treated with remdesivir and steroids, antibodies little bit of leg swelling but he attributed to not taking diuretics   Back in the hospital again for an additional 10-day stay October 28  he started to  run another fever up to 101.3 has not had any chest pain or wheezing no headaches. blood pressure has been  running low in the 90s so he stopped using his lisinopril.  His blood  pressure after that improved to 130s over 60s  CT chest,  . Diffuse multifocal areas of consolidation and ground-glass opacity throughout the lungs compatible with atypical infection in the setting of known COVID positivity.  LE arterial 08/2018,   Right lower extremity: Moderately to severely calcified SFA with diffuse disease throughout its course especially in the midsegment and three-vessel runoff below the knee.   AAA less than 3 cm, on scan in 2018 reviewed with him Numerous previous episode of chest pain relieved with belching consistent with GI etiology.  Previous cardiac catheterization 07/07/2015            Ost LM to LM lesion, 50% stenosed.            Ost RCA lesion, 80% stenosed.            Prox RCA-1 lesion, 80% stenosed.            Prox RCA-2 lesion, 80% stenosed.            LIMA was injected is moderate in size, and is anatomically normal.            Dist LAD lesion, 60% stenosed.            Ost LAD to Prox LAD lesion, 100% stenosed.            Mid LAD lesion, 60% stenosed.            The left ventricular systolic function is normal.            SVG was injected is normal in caliber, and is anatomically normal.            2nd Diag lesion, 50% stenosed.    severe RCA disease (small vessel) not amenable to PCI, moderate left main disease, moderate LAD and proximal diagonal disease. 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), CKD (chronic kidney disease), stage III (HCC), Community acquired pneumonia of right lower lobe of lung (06/05/2017), Diverticulosis,  Dupuytren's contracture of right hand (10/29/2008), DVT (deep venous thrombosis) (HCC) (1998), Elevated prostate specific antigen (PSA) (07/09/2008), Emphysema of lung (HCC), GERD (04/30/2007), Heart murmur, History of 2019 novel coronavirus disease (COVID-19) (05/20/2019), History of hiatal hernia, History of shingles, HLD (hyperlipidemia) (04/26/2007), HTN (hypertension) (04/30/2007), Laceration of skin of left hand (03/08/2018), LBBB (left bundle branch block), Lumbar disc disease with radiculopathy, Migraines, Myocardial infarction (HCC) (1990), Myocardial infarction (HCC) (2002), Neuropathy of both feet, OSA on CPAP, Osteoarthritis, PAD (peripheral artery disease) (HCC), Pneumonia (07/2014), Pneumonia due to COVID-19 virus (05/30/2019), Pre-diabetes, Prostatitis, chronic, Right inguinal hernia (05/26/2010), S/P CABG x 3 (1990), Spinal stenosis of lumbar region, and 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  PSH:    Past Surgical History:  Procedure Laterality Date   ABDOMINAL AORTOGRAM W/LOWER EXTREMITY [name] 09/12/2018   Procedure: ABDOMINAL AORTOGRAM W/LOWER EXTREMITY;  Surgeon: Iran Ouch, MD;  Location: [facility];  Service: Cardiovascular;  Laterality: [name];   ANTERIOR  CERVICAL DECOMP/DISCECTOMY FUSION [name] 07/11/2022   Anterior Cervical Decompression Fusion ,Interboy Prothesis,Plate/Screws , Cervical four-five,Cervical five-six [name], Tinnie Gens, MD)   APPENDECTOMY     rupture   BACK SURGERY     1984 and then another one [facility]   BLADDER INSTILLATION [name] 10/19/2021   Procedure: BLADDER INSTILLATION OF GEMCITABINE;  Surgeon: Riki Altes, MD;  Location: [facility];  Service: Urology;  Laterality: [name];   BLADDER INSTILLATION [name] 11/15/2022   Procedure: BLADDER INSTILLATION OF GEMCITABINE;  Surgeon: Riki Altes, MD;  Location: [facility];  Service: Urology;  Laterality: [name];   CARDIAC CATHETERIZATION [name] 07/07/2015   Procedure: Left Heart Cath and Cors/Grafts Angiography;   Surgeon: Antonieta Iba, MD;  Location: [facility];  Service: Cardiovascular;  Laterality: [name];   CATARACT EXTRACTION, BILATERAL  2009   CERVICAL SPINE SURGERY  12/2016   cervical stenosis [name])   COLONOSCOPY  03/2010   HP polyp, diverticulosis, rpt 10 yrs ([name])   CORONARY ANGIOPLASTY  11/30/2000   [facility]   CORONARY ARTERY BYPASS GRAFT  1990   3 vessel    CYSTOSCOPY  12/23/2010   [facility]   JOINT REPLACEMENT     KNEE ARTHROSCOPY Right    x2   LAMINOTOMY  1986   L5/S1 lumbar laminotomy for two ruptured discs/fusion   LUMBAR LAMINECTOMY/DECOMPRESSION MICRODISCECTOMY [name] 07/13/2016   Procedure: LUMBAR TWO-THREE, LUMBAR THREE-FOUR, LUMBAR FOUR-FIVE LAMINECTOMY AND FORAMINOTOMY;  Surgeon: Tressie Stalker, MD;  Location: [facility];  Service: Neurosurgery;  Laterality: [name];  LAMINECTOMY AND FORAMINOTOMY L2-L3, L3-L4,L4-L5   LUMBAR SPINE SURGERY  06/2020   Bilateral redo laminectomy/laminotomy/foraminotomies/medial facetectomy to decompress the bilateral L4 and L5 nerve roots [name])   TOTAL HIP ARTHROPLASTY Bilateral [id number]   TOTAL KNEE ARTHROPLASTY Right 03/06/2017   Procedure: RIGHT TOTAL KNEE ARTHROPLASTY;  Surgeon: Ollen Gross, MD;  Location: [facility];  Service: Orthopedics;  Laterality: Right;   TRANSURETHRAL RESECTION OF BLADDER TUMOR [name] 10/19/2021   Procedure: TRANSURETHRAL RESECTION OF BLADDER TUMOR (TURBT);  Surgeon: Riki Altes, MD;  Location: [facility];  Service: Urology;  Laterality: [name];   TRANSURETHRAL RESECTION OF BLADDER TUMOR [name] 11/15/2022   Procedure: TRANSURETHRAL RESECTION OF BLADDER TUMOR (TURBT);  Surgeon: Riki Altes, MD;  Location: [facility];  Service: Urology;  Laterality: [name];    Current Outpatient Medications  Medication Sig Dispense Refill   albuterol (PROVENTIL) (2.5 MG/3ML) 0.083% nebulizer solution Take 3 mLs (2.5 mg total) by nebulization every 6 (six) hours as needed for wheezing or shortness of breath. 75 mL 12   albuterol (VENTOLIN  HFA) 108 (90 Base) MCG/ACT inhaler TAKE 2 PUFFS INTO LUNGS EVERY 6 HOURS ASNEEDED FOR WHEEZING OR SHORTNESS OF BREATH 8.5 g 6   aspirin EC 81 MG EC tablet Take 1 tablet (81 mg total) by mouth daily. 30 tablet 0   atorvastatin (LIPITOR) 20 MG tablet TAKE 1 TABLET BY MOUTH DAILY. 90 tablet 3   Azelastine HCl (ASTEPRO) 0.15 % SOLN Place 1 spray into the nose at bedtime as needed (Sinus congestion, allergies). 30 mL 11   cyclobenzaprine (FLEXERIL) 5 MG tablet Take 1 tablet (5 mg total) by mouth 3 (three) times daily as needed for muscle spasms. 30 tablet 0   docusate sodium (COLACE) 100 MG capsule Take 1 capsule (100 mg total) by mouth 2 (two) times daily. 30 capsule 0   ezetimibe (ZETIA) 10 MG tablet Take 1 tablet (10 mg total) by mouth daily. 90 tablet 3   famotidine (PEPCID) 20  MG tablet Take 20 mg by mouth daily.     fluticasone (FLONASE) 50 MCG/ACT nasal spray Place 1 spray into both nostrils daily as needed for allergies or rhinitis.     Fluticasone-Umeclidin-Vilant (TRELEGY ELLIPTA) 200-62.5-25 MCG/ACT AEPB Inhale 1 puff into the lungs daily. 180 each 0   furosemide (LASIX) 20 MG tablet Take 1 tablet by mouth every third day as directed. 30 tablet 2   gabapentin (NEURONTIN) 600 MG tablet Take 1 tablet (600 mg total) by mouth 3 (three) times daily. 270 tablet 1   HYDROcodone-acetaminophen (NORCO/VICODIN) 5-325 MG tablet Take 1-2 tablets by mouth every 4 (four) hours as needed for moderate pain. 30 tablet 0   isosorbide mononitrate (IMDUR) 30 MG 24 hr tablet Take 1 tablet (30 mg total) by mouth daily. 90 tablet 3   losartan (COZAAR) 25 MG tablet TAKE 1 TABLET BY MOUTH DAILY 90 tablet 0   nitroGLYCERIN (NITROSTAT) 0.4 MG SL tablet Place 1 tablet (0.4 mg total) under the tongue every 5 (five) minutes as needed for chest pain. 25 tablet 3   polyethylene glycol (MIRALAX / GLYCOLAX) 17 g packet Take 17 g by mouth daily as needed for mild constipation.      ranolazine (RANEXA) 500 MG 12 hr tablet TAKE  ONE (1) TABLET BY MOUTH TWO TIMES PER DAY 180 tablet 3   silodosin (RAPAFLO) 8 MG CAPS capsule TAKE ONE CAPSULE BY MOUTH DAILY WITH BREAKFAST. 30 capsule 0   Fluticasone-Umeclidin-Vilant (TRELEGY ELLIPTA) 200-62.5-25 MCG/ACT AEPB Inhale 1 puff into the lungs daily. (Patient not taking: Reported on 12/20/2022) 14 each 0   guaiFENesin-codeine (ROBITUSSIN AC) 100-10 MG/5ML syrup Take 5 mLs by mouth 3 (three) times daily as needed for cough. (Patient not taking: Reported on 12/20/2022) 120 mL 0   No current facility-administered medications for this visit.    Allergies:   Vioxx [rofecoxib], Spiriva respimat [tiotropium bromide monohydrate], Contrast media [iodinated contrast media], and Morphine   Social History:  The patient  reports that he quit smoking about 34 years ago. His smoking use included cigarettes. He has a 25.00 pack-year smoking history. He has been exposed to tobacco smoke. He has never used smokeless tobacco. He reports that he does not drink alcohol and does not use drugs.   Family History:   family history includes Cancer in his sister and another family member; Diabetes in his brother and mother; Heart attack in his mother; Heart disease in his father; Polycystic kidney disease in his father and sister; Stroke in his father and mother.   Review of Systems: Review of Systems  Constitutional:  Positive for malaise/fatigue.  HENT: Negative.    Respiratory: Negative.    Cardiovascular:  Positive for chest pain.  Gastrointestinal: Negative.   Musculoskeletal: Negative.   Neurological: Negative.   Psychiatric/Behavioral: Negative.    All other systems reviewed and are negative.   PHYSICAL EXAM: VS:  BP (!) 142/60 (BP Location: Left Arm, Patient Position: Sitting, Cuff Size: Normal)   Pulse 80   Ht 6' (1.829 m)   Wt 208 lb (94.3 kg)   SpO2 95%   BMI 28.21 kg/m  , BMI Body mass index is 28.21 kg/m. Constitutional:  oriented to person, place, and time. No distress.  HENT:   Head: Grossly normal Eyes:  no discharge. No scleral icterus.  Neck: No JVD, no carotid bruits  Cardiovascular: Regular rate and rhythm, no murmurs appreciated Pulmonary/Chest: Clear to auscultation bilaterally, no wheezes or rails Abdominal: Soft.  no distension.  no tenderness.  Musculoskeletal: Normal range of motion Neurological:  normal muscle tone. Coordination normal. No atrophy Skin: Skin warm and dry Psychiatric: normal affect, pleasant  Recent Labs: 10/04/2022: Hemoglobin 13.0; Platelets 150 10/07/2022: ALT 14; BUN 25; Creatinine, Ser 1.28; Potassium 4.6; Sodium 140    Lipid Panel Lab Results  Component Value Date   CHOL 105 10/07/2022   HDL 41.30 10/07/2022   LDLCALC 49 10/07/2022   TRIG 73.0 10/07/2022      Wt Readings from Last 3 Encounters:  12/20/22 208 lb (94.3 kg)  12/06/22 206 lb (93.4 kg)  11/24/22 206 lb (93.4 kg)     ASSESSMENT AND PLAN:  Cardiomyopathy, post covid, recent bronchitis Low ejection fraction in the setting of COVID in 2020 recovered, EF up to 50 -55% by echo 09/2019, same in 2022,  Drop in ejection fraction 40 to 45% in the setting of recent bronchitis Having some chest pain  Stress test ordered Recommend he continue losartan and imdur Does not want to start expensive medications, we did talk about Sherryll Burger We will start Coreg 3.125 twice daily as he reports having some rare palpitations  Cad with stable angina Currently with no symptoms of angina. No further workup at this time. Continue current medication regimen. Last stress test September 2022 Repeat stress test ordered given some chest pain symptoms, mild drop in ejection fraction  Covid , 05/2019 With associated cardiomyopathy, drop in ejection fraction recovered, now with mild drop again after recent bronchitis  COPD/emphysema Followed by pulmonary Prior smoking history, Covid 2020 Recent bronchitis treated with antibiotics, feels symptoms are better  Mixed  hyperlipidemia Cholesterol is at goal on the current lipid regimen. No changes to the medications were made.  Essential hypertension Blood pressure is well controlled on today's visit. No changes made to the medications.  Bilateral carotid artery stenosis Mild bilateral carotid disease less than 39% bilaterally Continue aggressive lipid management  Diet-controlled diabetes mellitus (HCC) Well-controlled  PAD Follows with Dr. Kirke Corin ,  bilateral SFA disease No claudications Medical management has been recommended  Long discussion concerning chronic shortness of breath, chronic chest pain symptoms, cardiomyopathy, medication changes made  Total encounter time more than 40 minutes  Greater than 50% was spent in counseling and coordination of care with the patient   No orders of the defined types were placed in this encounter.     Signed, Dossie Arbour, M.D., Ph.D. 12/20/2022  [facility] [place], [place] [phone]

## 2022-12-20 ENCOUNTER — Encounter: Payer: Self-pay | Admitting: Cardiovascular Disease

## 2022-12-20 ENCOUNTER — Telehealth: Payer: Self-pay | Admitting: Cardiovascular Disease

## 2022-12-20 ENCOUNTER — Ambulatory Visit: Payer: HMO | Attending: Cardiovascular Disease | Admitting: Cardiovascular Disease

## 2022-12-20 VITALS — BP 142/60 | HR 80 | Ht 72.0 in | Wt 208.0 lb

## 2022-12-20 DIAGNOSIS — I739 Peripheral vascular disease, unspecified: Secondary | ICD-10-CM | POA: Diagnosis not present

## 2022-12-20 DIAGNOSIS — E785 Hyperlipidemia, unspecified: Secondary | ICD-10-CM

## 2022-12-20 DIAGNOSIS — I5032 Chronic diastolic (congestive) heart failure: Secondary | ICD-10-CM

## 2022-12-20 DIAGNOSIS — U071 COVID-19: Secondary | ICD-10-CM | POA: Diagnosis not present

## 2022-12-20 DIAGNOSIS — I25810 Atherosclerosis of coronary artery bypass graft(s) without angina pectoris: Secondary | ICD-10-CM | POA: Diagnosis not present

## 2022-12-20 DIAGNOSIS — I255 Ischemic cardiomyopathy: Secondary | ICD-10-CM | POA: Diagnosis not present

## 2022-12-20 DIAGNOSIS — N183 Chronic kidney disease, stage 3 unspecified: Secondary | ICD-10-CM | POA: Diagnosis not present

## 2022-12-20 DIAGNOSIS — I5022 Chronic systolic (congestive) heart failure: Secondary | ICD-10-CM

## 2022-12-20 DIAGNOSIS — I43 Cardiomyopathy in diseases classified elsewhere: Secondary | ICD-10-CM | POA: Diagnosis not present

## 2022-12-20 DIAGNOSIS — I10 Essential (primary) hypertension: Secondary | ICD-10-CM

## 2022-12-20 MED ORDER — CARVEDILOL 3.125 MG PO TABS: 3.1250 mg | ORAL_TABLET | Freq: Two times a day (BID) | ORAL | 3 refills | Status: DC

## 2022-12-20 NOTE — Telephone Encounter (Signed)
Pt c/o medication issue:  1. Name of Medication:   losartan (COZAAR) 25 MG tablet    2. How are you currently taking this medication (dosage and times per day)?   TAKE 1 TABLET BY MOUTH DAILY    3. Are you having a reaction (difficulty breathing--STAT)? no  4. What is your medication issue? Patient called stating at his visit today they were taking about him stopping this medication.  Patient wants to know if he is to stop taking this medication.  Please advise.

## 2022-12-20 NOTE — Telephone Encounter (Signed)
Spoke with patient. Patient informed that Dr. Mariah Milling would like him to continue his losartan at this time. Patient verbalizes understanding.

## 2022-12-20 NOTE — Patient Instructions (Addendum)
Medication Instructions:  Please start coreg 3.125 mg twice a day  If you need a refill on your cardiac medications before your next appointment, please call your pharmacy.   Lab work: No new labs needed  Testing/Procedures:  Your provider has ordered a Lexiscan/ Exercise Myoview Stress test. This will take place at [facility]. Please report to the [facility] medical mall entrance. The volunteers at the first desk will direct you where to go.  [facility] MYOVIEW  Your provider has ordered a Stress Test with nuclear imaging. The purpose of this test is to evaluate the blood supply to your heart muscle. This procedure is referred to as a "Non-Invasive Stress Test." This is because other than having an IV started in your vein, nothing is inserted or "invades" your body. Cardiac stress tests are done to find areas of poor blood flow to the heart by determining the extent of coronary artery disease (CAD). Some patients exercise on a treadmill, which naturally increases the blood flow to your heart, while others who are unable to walk on a treadmill due to physical limitations will have a pharmacologic/chemical stress agent called Lexiscan . This medicine will mimic walking on a treadmill by temporarily increasing your coronary blood flow.   Please note: these test may take anywhere between 2-4 hours to complete  How to prepare for your Myoview test:  Nothing to eat for 6 hours prior to the test No caffeine for 24 hours prior to test No smoking 24 hours prior to test. Your medication may be taken with water.  If your doctor stopped a medication because of this test, do not take that medication. Ladies, please do not wear dresses.  Skirts or pants are appropriate. Please wear a short sleeve shirt. No perfume, cologne or lotion. Wear comfortable walking shoes. No heels!   PLEASE NOTIFY THE OFFICE AT LEAST 24 HOURS IN ADVANCE IF YOU ARE UNABLE TO KEEP YOUR APPOINTMENT.  [phone] AND  PLEASE NOTIFY NUCLEAR  MEDICINE AT [facility] AT LEAST 24 HOURS IN ADVANCE IF YOU ARE UNABLE TO KEEP YOUR APPOINTMENT. [phone]   Follow-Up: At [facility], you and your health needs are our priority.  As part of our continuing mission to provide you with exceptional heart care, we have created designated Provider Care Teams.  These Care Teams include your primary Cardiologist (physician) and Advanced Practice Providers (APPs -  Physician Assistants and Nurse Practitioners) who all work together to provide you with the care you need, when you need it.  You will need a follow up appointment in 3 months  Providers on your designated Care Team:   Nicolasa Ducking, NP Eula Listen, PA-C Cadence Fransico [place], [place]  COVID-19 Vaccine Information can be found at: [email or web address] For questions related to vaccine distribution or appointments, please email [email or web address] or call [phone].

## 2022-12-24 ENCOUNTER — Other Ambulatory Visit: Payer: Self-pay

## 2022-12-24 ENCOUNTER — Emergency Department
Admission: EM | Admit: 2022-12-24 | Discharge: 2022-12-25 | Disposition: A | Discharge status: Home / Self Care | Payer: HMO | Source: Home / Self Care

## 2022-12-24 ENCOUNTER — Emergency Department
Admission: EM | Admit: 2022-12-24 | Discharge: 2022-12-24 | Disposition: A | Discharge status: Home / Self Care | Payer: HMO | Attending: Emergency Medicine | Admitting: Emergency Medicine

## 2022-12-24 ENCOUNTER — Encounter: Payer: Self-pay | Admitting: Emergency Medicine

## 2022-12-24 DIAGNOSIS — L03116 Cellulitis of left lower limb: Secondary | ICD-10-CM | POA: Insufficient documentation

## 2022-12-24 DIAGNOSIS — I2510 Atherosclerotic heart disease of native coronary artery without angina pectoris: Secondary | ICD-10-CM | POA: Insufficient documentation

## 2022-12-24 DIAGNOSIS — S81812A Laceration without foreign body, left lower leg, initial encounter: Secondary | ICD-10-CM | POA: Insufficient documentation

## 2022-12-24 DIAGNOSIS — Z8551 Personal history of malignant neoplasm of bladder: Secondary | ICD-10-CM | POA: Diagnosis not present

## 2022-12-24 DIAGNOSIS — L089 Local infection of the skin and subcutaneous tissue, unspecified: Secondary | ICD-10-CM

## 2022-12-24 DIAGNOSIS — N183 Chronic kidney disease, stage 3 unspecified: Secondary | ICD-10-CM | POA: Diagnosis not present

## 2022-12-24 DIAGNOSIS — E1122 Type 2 diabetes mellitus with diabetic chronic kidney disease: Secondary | ICD-10-CM | POA: Insufficient documentation

## 2022-12-24 DIAGNOSIS — I129 Hypertensive chronic kidney disease with stage 1 through stage 4 chronic kidney disease, or unspecified chronic kidney disease: Secondary | ICD-10-CM | POA: Insufficient documentation

## 2022-12-24 DIAGNOSIS — W0110XA Fall on same level from slipping, tripping and stumbling with subsequent striking against unspecified object, initial encounter: Secondary | ICD-10-CM | POA: Diagnosis not present

## 2022-12-24 DIAGNOSIS — I5032 Chronic diastolic (congestive) heart failure: Secondary | ICD-10-CM | POA: Diagnosis not present

## 2022-12-24 DIAGNOSIS — M79605 Pain in left leg: Secondary | ICD-10-CM | POA: Diagnosis present

## 2022-12-24 MED ORDER — MUPIROCIN 2 % EX OINT: 1.0000 | TOPICAL_OINTMENT | Freq: Two times a day (BID) | CUTANEOUS | 0 refills | Status: DC

## 2022-12-24 MED ORDER — DOXYCYCLINE MONOHYDRATE 100 MG PO TABS: 100.0000 mg | ORAL_TABLET | Freq: Two times a day (BID) | ORAL | 0 refills | Status: AC

## 2022-12-24 MED ORDER — CEPHALEXIN 500 MG PO CAPS: 500.0000 mg | ORAL_CAPSULE | Freq: Four times a day (QID) | ORAL | 0 refills | Status: AC

## 2022-12-24 NOTE — ED Provider Notes (Signed)
[facility] Provider Note    None    (approximate)   History   Leg Pain   HPI  [name] is a 82 y.o. male with a past medical history of heart failure, CKD, anxiety, diabetes, CAD, hyperlipidemia who presents today for evaluation of wound to his left shin.  Patient reports that approximately 1 week ago he slipped on wet ground and hit his shin on the edge of his flower bed.  He reports that he went to [facility] at that time and had the area repaired with skin adhesive.  He feels like the area was then cleaned well enough prior to the repair.  He reports that over the last 3 days he has developed redness around the wound, and the glue has fallen off.  No fevers or chills.  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   Coccydynia 09/20/2019   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   Anxiety 06/04/2019   Chronic right shoulder pain 09/10/2018   Cervical stenosis of spinal canal 06/05/2017   Pedal edema 06/05/2017   Health maintenance examination 12/06/2016   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/13/2016   Overweight (BMI 25.0-29.9) 07/04/2016   Low vitamin B12 level 01/01/2016   Exertional dyspnea 07/07/2015   Medicare annual wellness visit, subsequent 07/03/2015   Advanced care planning/counseling discussion 07/03/2015   Type 2 diabetes mellitus with other specified complication  (HCC) 05/04/2015   Pleuritic chest pain 05/04/2015   Ex-smoker 05/04/2015   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   Spermatocele 04/02/2013   Syncopal vertigo 11/04/2010   Lumbar disc disease with radiculopathy 11/04/2010   CAD, ARTERY BYPASS GRAFT 08/11/2009   PAD (peripheral artery disease) (HCC) 08/11/2009   DUPUYTREN'S CONTRACTURE, RIGHT 10/29/2008   Carotid stenosis 07/09/2008   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   Benign prostatic hyperplasia with urinary obstruction 09/06/2007   Essential hypertension 04/30/2007   GERD 04/30/2007   Hyperlipidemia associated with type 2 diabetes mellitus (HCC) 04/26/2007   OA (osteoarthritis) of knee 04/26/2007   DVT, HX OF 04/26/2007          Physical Exam   Triage Vital Signs: ED Triage Vitals  Enc Vitals Group     BP 12/24/22 0730 (!) 144/61     Pulse Rate 12/24/22 0730 78     Resp 12/24/22 0730 18     Temp 12/24/22 0730 97.9 F (36.6 C)     Temp Source 12/24/22 0730 Oral     SpO2 12/24/22 0730 95 %     Weight 12/24/22 0725 208 lb (94.3 kg)     Height 12/24/22 0725 6' (1.829 m)     Head Circumference --      Peak Flow --      Pain Score 12/24/22 0725 10     Pain Loc --      Pain Edu? --      Excl. in GC? --     Most recent vital  signs: Vitals:   12/24/22 0730  BP: (!) 144/61  Pulse: 78  Resp: 18  Temp: 97.9 F (36.6 C)  SpO2: 95%    Physical Exam Vitals and nursing note reviewed.  Constitutional:      General: Awake and alert. No acute distress.    Appearance: Normal appearance. The patient is normal weight.  HENT:     Head: Normocephalic and atraumatic.     Mouth: Mucous membranes are moist.  Eyes:     General: PERRL. Normal EOMs        Right eye: No discharge.        Left eye: No discharge.     Conjunctiva/sclera: Conjunctivae normal.  Cardiovascular:     Rate and Rhythm: Normal rate and regular rhythm.     Pulses: Normal pulses.  Pulmonary:     Effort: Pulmonary effort is normal. No respiratory  distress.     Breath sounds: Normal breath sounds.  Abdominal:     Abdomen is soft. There is no abdominal tenderness. Musculoskeletal:        General: No swelling. Normal range of motion.     Cervical back: Normal range of motion and neck supple.  Left shin: There is a 4 cm linear laceration with approximately 4 mm of wound dehiscence and serosanguineous drainage with surrounding erythema extending approximately 2 to 3 cm from the wound site.  There is mild swelling at the wound site only.  No circumferential swelling of the leg.  No lymphangitis.  No crepitus.  No lymphadenopathy. Skin:    General: Skin is warm and dry.     Capillary Refill: Capillary refill takes less than 2 seconds.     Findings: No rash.  Neurological:     Mental Status: The patient is awake and alert.         ED Results / Procedures / Treatments   Labs (all labs ordered are listed, but only abnormal results are displayed) Labs Reviewed - No data to display   EKG     RADIOLOGY     PROCEDURES:  Critical Care performed:   Procedures   MEDICATIONS ORDERED IN ED: Medications - No data to display   IMPRESSION / MDM / ASSESSMENT AND PLAN / ED COURSE  I reviewed the triage vital signs and the nursing notes.   Differential diagnosis includes, but is not limited to, cellulitis, wound dehiscence, abscess.  Patient is awake and alert, hemodynamically stable and afebrile.  No hypotension or tachycardia.  I reviewed the patient's chart.  I am unable to see the visit where the patient reports that he had his wound repaired.  Physical exam is consistent with developing cellulitis. No crepitus or bullae or hemodynamic instability or pain out of proportion to indicate deep space infection. No fluctuance to suggest cystic lesion such as abscess. No fevers or constitutional symptoms to suggest systemic infection.  Physical exam does not show any evidence of joint involvement. No lymphangitis.  The area was  cleaned extensively with normal saline irrigation and chlorhexidine.  It was bandaged with Xeroform and a sterile dressing.  We discussed the importance of removing the dressing so that the wound can air out, and we also discussed wound care extensively.  Patient will initiate oral antibiotics and follow-up closely as an outpatient. Return if worsening or developing new symptoms in which case may require IV antibiotics.  He reports that his daughter is a Engineer, civil (consulting) and will help him to take care of it.  Discussed care  plan, return precautions, and advised close outpatient follow-up. Patient agrees with plan of care.   Patient's presentation is most consistent with acute illness / injury with system symptoms.     FINAL CLINICAL IMPRESSION(S) / ED DIAGNOSES   Final diagnoses:  Cellulitis of left lower extremity  Abrasion of leg with infection, left, initial encounter     Rx / DC Orders   ED Discharge Orders          Ordered    cephALEXin (KEFLEX) 500 MG capsule  4 times daily        12/24/22 0749    doxycycline (ADOXA) 100 MG tablet  2 times daily        12/24/22 0749    mupirocin ointment (BACTROBAN) 2 %  2 times daily        12/24/22 0749             Note:  This document was prepared using Dragon voice recognition software and may include unintentional dictation errors.   Jackelyn Hoehn, PA-C 12/24/22 1610    Chesley Noon, MD 12/24/22 [phone]

## 2022-12-24 NOTE — Discharge Instructions (Signed)
You were evaluated in the emergency department for a wound infection.  Keep the area clean and dry.  Wash multiple times per day with soap and water.  Do not go into the ocean or swimming pool.  Return to the emergency department for:  -- Fever > 100.8F -- Increase pain in the wound -- Increase redness and swelling -- Wound bleeds more than a small amount or it does not stop -- Wound edges come apart -- Severe pain -- Weakness or numbness in the affected area  Or any other new or worsening symptoms. It was a pleasure caring for you.

## 2022-12-24 NOTE — ED Notes (Signed)
See triage note  Presents with pain to left lower leg  States he fell 1 week ago   Had laceration/skin tear to leg  Was seen at that time  States the area was Dermabond  But area opened up again couple of days later  Area is red with some swelling and drainage noted

## 2022-12-24 NOTE — ED Triage Notes (Signed)
Pt via POV from home. Pt had a mechanical fall last Friday, states that he was UC the same day, states that he has a laceration to the L upper shin. States that he believes it is infected. Small lac noted to the L upper shin, bleeding controlled. Pt is A&Ox4 and NAD, ambulatory to triage.

## 2022-12-27 ENCOUNTER — Ambulatory Visit
Admission: RE | Admit: 2022-12-27 | Discharge: 2022-12-27 | Disposition: A | Discharge status: Home / Self Care | Payer: HMO | Source: Ambulatory Visit | Attending: Cardiovascular Disease | Admitting: Cardiovascular Disease

## 2022-12-27 ENCOUNTER — Encounter: Payer: Self-pay | Admitting: Family Medicine

## 2022-12-27 ENCOUNTER — Ambulatory Visit (INDEPENDENT_AMBULATORY_CARE_PROVIDER_SITE_OTHER): Payer: HMO | Admitting: Family Medicine

## 2022-12-27 VITALS — BP 132/64 | HR 71 | Temp 97.9°F | Ht 72.0 in | Wt 210.2 lb

## 2022-12-27 DIAGNOSIS — I255 Ischemic cardiomyopathy: Secondary | ICD-10-CM

## 2022-12-27 DIAGNOSIS — I43 Cardiomyopathy in diseases classified elsewhere: Secondary | ICD-10-CM | POA: Diagnosis not present

## 2022-12-27 DIAGNOSIS — L089 Local infection of the skin and subcutaneous tissue, unspecified: Secondary | ICD-10-CM

## 2022-12-27 DIAGNOSIS — S81802A Unspecified open wound, left lower leg, initial encounter: Secondary | ICD-10-CM | POA: Diagnosis not present

## 2022-12-27 DIAGNOSIS — U071 COVID-19: Secondary | ICD-10-CM

## 2022-12-27 LAB — NM MYOCAR MULTI W/SPECT W/WALL MOTION / EF
Nuc Stress EF: 32 %
Rest HR: 65 {beats}/min
Rest Nuclear Isotope Dose: 10.3 mCi
SDS: 3
SRS: 9
SSS: 10
Stress Nuclear Isotope Dose: 29.2 mCi
TID: 1.05

## 2022-12-27 MED ORDER — TECHNETIUM TC 99M TETROFOSMIN IV KIT
29.1500 | PACK | Freq: Once | INTRAVENOUS | Status: AC | PRN
  Administered 2022-12-27: 29.15 via INTRAVENOUS

## 2022-12-27 MED ORDER — TECHNETIUM TC 99M TETROFOSMIN IV KIT
10.0000 | PACK | Freq: Once | INTRAVENOUS | Status: AC | PRN
  Administered 2022-12-27: 10.29 via INTRAVENOUS

## 2022-12-27 MED ORDER — REGADENOSON 0.4 MG/5ML IV SOLN
0.4000 mg | Freq: Once | INTRAVENOUS | Status: AC
  Administered 2022-12-27: 0.4 mg via INTRAVENOUS

## 2022-12-27 NOTE — Progress Notes (Unsigned)
Ph: [phone] Fax: [phone]   Patient ID: [name], male    DOB: 1941-08-08, 82 y.o.   MRN: [id number]  This visit was conducted in person.  BP 132/64   Pulse 71   Temp 97.9 F (36.6 C) (Temporal)   Ht 6' (1.829 m)   Wt 210 lb 4 oz (95.4 kg)   SpO2 95%   BMI 28.52 kg/m    CC: ER f/u visit  Subjective:   HPI: [name] is a 82 y.o. male presenting on 12/27/2022 for Hospitalization Follow-up (Seen on 12/24/22 at [facility] ED, dx cellulitis L LE; abrasion of L leg w/infection.)   Recent ER visit for LLE cellulitis after abrasion that was glued at urgent care after fall in yard. Started on keflex and doxycycline antibiotic 7d course, as well as topical mupirocin.   Area still red but improving.   No fevers/chills, nausea, vomiting.   Recent stress test this morning.      Relevant past medical, surgical, family and social history reviewed and updated as indicated. Interim medical history since our last visit reviewed. Allergies and medications reviewed and updated. Outpatient Medications Prior to Visit  Medication Sig Dispense Refill   albuterol (PROVENTIL) (2.5 MG/3ML) 0.083% nebulizer solution Take 3 mLs (2.5 mg total) by nebulization every 6 (six) hours as needed for wheezing or shortness of breath. 75 mL 12   albuterol (VENTOLIN HFA) 108 (90 Base) MCG/ACT inhaler TAKE 2 PUFFS INTO LUNGS EVERY 6 HOURS ASNEEDED FOR WHEEZING OR SHORTNESS OF BREATH 8.5 g 6   aspirin EC 81 MG EC tablet Take 1 tablet (81 mg total) by mouth daily. 30 tablet 0   atorvastatin (LIPITOR) 20 MG tablet TAKE 1 TABLET BY MOUTH DAILY. 90 tablet 3   Azelastine HCl (ASTEPRO) 0.15 % SOLN Place 1 spray into the nose at bedtime as needed (Sinus congestion, allergies). 30 mL 11   carvedilol (COREG) 3.125 MG tablet Take 1 tablet (3.125 mg total) by mouth 2 (two) times daily. 180 tablet 3   cephALEXin (KEFLEX) 500 MG capsule Take 1 capsule (500 mg total) by mouth 4 (four) times daily for 7 days.  28 capsule 0   cyclobenzaprine (FLEXERIL) 5 MG tablet Take 1 tablet (5 mg total) by mouth 3 (three) times daily as needed for muscle spasms. 30 tablet 0   docusate sodium (COLACE) 100 MG capsule Take 1 capsule (100 mg total) by mouth 2 (two) times daily. 30 capsule 0   doxycycline (ADOXA) 100 MG tablet Take 1 tablet (100 mg total) by mouth 2 (two) times daily for 7 days. 14 tablet 0   ezetimibe (ZETIA) 10 MG tablet Take 1 tablet (10 mg total) by mouth daily. 90 tablet 3   famotidine (PEPCID) 20 MG tablet Take 20 mg by mouth daily.     fluticasone (FLONASE) 50 MCG/ACT nasal spray Place 1 spray into both nostrils daily as needed for allergies or rhinitis.     Fluticasone-Umeclidin-Vilant (TRELEGY ELLIPTA) 200-62.5-25 MCG/ACT AEPB Inhale 1 puff into the lungs daily. 180 each 0   Fluticasone-Umeclidin-Vilant (TRELEGY ELLIPTA) 200-62.5-25 MCG/ACT AEPB Inhale 1 puff into the lungs daily. 14 each 0   furosemide (LASIX) 20 MG tablet Take 1 tablet by mouth every third day as directed. 30 tablet 2   gabapentin (NEURONTIN) 600 MG tablet Take 1 tablet (600 mg total) by mouth 3 (three) times daily. 270 tablet 1   guaiFENesin-codeine (ROBITUSSIN AC) 100-10 MG/5ML syrup Take 5 mLs by mouth 3 (three)  times daily as needed for cough. 120 mL 0   HYDROcodone-acetaminophen (NORCO/VICODIN) 5-325 MG tablet Take 1-2 tablets by mouth every 4 (four) hours as needed for moderate pain. 30 tablet 0   isosorbide mononitrate (IMDUR) 30 MG 24 hr tablet Take 1 tablet (30 mg total) by mouth daily. 90 tablet 3   losartan (COZAAR) 25 MG tablet TAKE 1 TABLET BY MOUTH DAILY 90 tablet 0   mupirocin ointment (BACTROBAN) 2 % Apply 1 Application topically 2 (two) times daily. 22 g 0   nitroGLYCERIN (NITROSTAT) 0.4 MG SL tablet Place 1 tablet (0.4 mg total) under the tongue every 5 (five) minutes as needed for chest pain. 25 tablet 3   polyethylene glycol (MIRALAX / GLYCOLAX) 17 g packet Take 17 g by mouth daily as needed for mild  constipation.      ranolazine (RANEXA) 500 MG 12 hr tablet TAKE ONE (1) TABLET BY MOUTH TWO TIMES PER DAY 180 tablet 3   silodosin (RAPAFLO) 8 MG CAPS capsule TAKE ONE CAPSULE BY MOUTH DAILY WITH BREAKFAST. 30 capsule 0   No facility-administered medications prior to visit.     Per HPI unless specifically indicated in ROS section below Review of Systems  Objective:  BP 132/64   Pulse 71   Temp 97.9 F (36.6 C) (Temporal)   Ht 6' (1.829 m)   Wt 210 lb 4 oz (95.4 kg)   SpO2 95%   BMI 28.52 kg/m   Wt Readings from Last 3 Encounters:  12/27/22 210 lb 4 oz (95.4 kg)  12/24/22 208 lb (94.3 kg)  12/20/22 208 lb (94.3 kg)      Physical Exam      Assessment & Plan:   Problem List Items Addressed This Visit   None    No orders of the defined types were placed in this encounter.   No orders of the defined types were placed in this encounter.   There are no Patient Instructions on file for this visit.  Follow up plan: No follow-ups on file.  Eustaquio Boyden, MD

## 2022-12-27 NOTE — Progress Notes (Unsigned)
BCG Bladder Instillation  We did not start his BCG today as he was having some burning with urination.  He was also seen in the emergency department for a leg wound suffered after a mechanical fall and he has been placed on doxycycline and cephalexin.  Doxycycline may cause the BCG treatments to be less effective.  UA is yellow clear, specific gravity less than 1.005, trace blood, pH 5.5, trace leukocytes, 11-30 WBCs, 0-2 RBCs, 0-10 epithelial cells and few bacteria.  So, we have decided to delay his treatment by 1 week.  I have sent the urine for culture.  And he will return next week to start his BCG induction.  Follow up/ Additional notes: One week for #1/6 BCG.

## 2022-12-27 NOTE — Patient Instructions (Signed)
Finish antibiotics Continue daily dressing change with topical antibiotic Let us know if streaking redness or fever or worsening pain.

## 2022-12-28 ENCOUNTER — Ambulatory Visit (INDEPENDENT_AMBULATORY_CARE_PROVIDER_SITE_OTHER): Payer: HMO | Admitting: Urology

## 2022-12-28 ENCOUNTER — Encounter: Payer: Self-pay | Admitting: Urology

## 2022-12-28 DIAGNOSIS — S81802D Unspecified open wound, left lower leg, subsequent encounter: Secondary | ICD-10-CM

## 2022-12-28 DIAGNOSIS — R300 Dysuria: Secondary | ICD-10-CM | POA: Diagnosis not present

## 2022-12-28 DIAGNOSIS — L089 Local infection of the skin and subcutaneous tissue, unspecified: Secondary | ICD-10-CM | POA: Insufficient documentation

## 2022-12-28 DIAGNOSIS — D494 Neoplasm of unspecified behavior of bladder: Secondary | ICD-10-CM | POA: Diagnosis not present

## 2022-12-28 HISTORY — DX: Unspecified open wound, left lower leg, subsequent encounter: S81.802D

## 2022-12-28 LAB — URINALYSIS, COMPLETE
Bilirubin, UA: NEGATIVE
Glucose, UA: NEGATIVE
Ketones, UA: NEGATIVE
Nitrite, UA: NEGATIVE
Protein,UA: NEGATIVE
Specific Gravity, UA: <1.005 — ABNORMAL LOW (ref 1.005–1.030)
Urobilinogen, Ur: 0.2 mg/dL (ref 0.2–1.0)
pH, UA: 5.5 (ref 5.0–7.5)

## 2022-12-28 LAB — MICROSCOPIC EXAMINATION

## 2022-12-28 LAB — NM MYOCAR MULTI W/SPECT W/WALL MOTION / EF
LV dias vol: 194 mL (ref 62–150)
LV sys vol: 132 mL
Peak HR: 99 {beats}/min
Percent HR: 71 %

## 2022-12-28 NOTE — Addendum Note (Signed)
Addended by: Eustaquio Boyden on: 12/28/2022 11:10 AM   Modules accepted: Level of Service

## 2022-12-28 NOTE — Assessment & Plan Note (Addendum)
Wound cleaned and dressed in office, home instructions reviewed.  Seems to be healing well with current antibiotics on board and treatment to date.  Some inferior wound scabbing present.  Red flags to seek further care reviewed.  Pt agrees with plan.

## 2022-12-30 ENCOUNTER — Telehealth: Payer: Self-pay | Admitting: Cardiovascular Disease

## 2022-12-30 NOTE — Telephone Encounter (Signed)
Patient is requesting a call back to discuss stress test results. 

## 2022-12-30 NOTE — Telephone Encounter (Signed)
Reviewed that results are still pending provider review and recommendations. He noted that the results did come through My Chart and he was very concerned. Advised that I would most certainly give him a call back with those results once they are read. He verbalized understanding with no further questions at this time.

## 2022-12-31 LAB — CULTURE, URINE COMPREHENSIVE

## 2023-01-02 LAB — CULTURE, URINE COMPREHENSIVE

## 2023-01-02 MED ORDER — ISOSORBIDE MONONITRATE ER 30 MG PO TB24: 30.0000 mg | ORAL_TABLET | Freq: Two times a day (BID) | ORAL | 3 refills | Status: DC

## 2023-01-02 NOTE — Telephone Encounter (Signed)
Patient made aware of results and verbalized understanding.  Appointment made for 5/24 with Eula Listen, PA.    Antonieta Iba, MD  P Cv [facility] Triage Needs appointment to go over stress test results, APP ok There is a region of ischemia in the inferior wall This could be explained by the right coronary artery which is small, receives flow from the left side of the heart coming over to the right If he continues to have anginal symptoms, cardiac catheterization could be performed Would recommend we increase the isosorbide up to 30 mg twice a day

## 2023-01-02 NOTE — Telephone Encounter (Signed)
Patient is following up per stress test results. He is requesting to be worked in for a sooner appointment than 7/02. He assumes he needs to be seen sooner per results.

## 2023-01-03 ENCOUNTER — Ambulatory Visit (INDEPENDENT_AMBULATORY_CARE_PROVIDER_SITE_OTHER): Payer: HMO | Admitting: Family Medicine

## 2023-01-03 ENCOUNTER — Encounter: Payer: Self-pay | Admitting: Family Medicine

## 2023-01-03 VITALS — BP 124/62 | HR 60 | Temp 97.2°F | Ht 72.0 in | Wt 211.0 lb

## 2023-01-03 DIAGNOSIS — S81802D Unspecified open wound, left lower leg, subsequent encounter: Secondary | ICD-10-CM | POA: Diagnosis not present

## 2023-01-03 NOTE — Assessment & Plan Note (Signed)
Slowed healing. Send wound culture.  Continue mupirocin topically.  RTC 1 wk f/u visit. Discussed possible wound clinic eval if not healing as expected by next visit.

## 2023-01-03 NOTE — Progress Notes (Signed)
Ph: [phone] Fax: [phone]   Patient ID: [name], male    DOB: [birth year], 82 y.o.   MRN: [id number]  This visit was conducted in person.  BP 124/62   Pulse 60   Temp (!) 97.2 F (36.2 C) (Temporal)   Ht 6' (1.829 m)   Wt 211 lb (95.7 kg)   SpO2 96%   BMI 28.62 kg/m    CC: follow up infected leg wound  Subjective:   HPI: [name] is a 82 y.o. male presenting on 01/03/2023 for Wound Check (Here to have L LE wound checked. States wound looks worse. )   See prior note for details.  LLE cellulitis after suffering abrasion to anterior L leg after fall in yard.  Seen at [facility] then at ER, treated with keflex + doxycycline 7d course along with topical mupirocin.   Wound continues draining, pain is slowly improving.      Relevant past medical, surgical, family and social history reviewed and updated as indicated. Interim medical history since our last visit reviewed. Allergies and medications reviewed and updated. Outpatient Medications Prior to Visit  Medication Sig Dispense Refill   albuterol (PROVENTIL) (2.5 MG/3ML) 0.083% nebulizer solution Take 3 mLs (2.5 mg total) by nebulization every 6 (six) hours as needed for wheezing or shortness of breath. 75 mL 12   albuterol (VENTOLIN HFA) 108 (90 Base) MCG/ACT inhaler TAKE 2 PUFFS INTO LUNGS EVERY 6 HOURS ASNEEDED FOR WHEEZING OR SHORTNESS OF BREATH 8.5 g 6   aspirin EC 81 MG EC tablet Take 1 tablet (81 mg total) by mouth daily. 30 tablet 0   atorvastatin (LIPITOR) 20 MG tablet TAKE 1 TABLET BY MOUTH DAILY. 90 tablet 3   Azelastine HCl (ASTEPRO) 0.15 % SOLN Place 1 spray into the nose at bedtime as needed (Sinus congestion, allergies). 30 mL 11   carvedilol (COREG) 3.125 MG tablet Take 1 tablet (3.125 mg total) by mouth 2 (two) times daily. 180 tablet 3   cyclobenzaprine (FLEXERIL) 5 MG tablet Take 1 tablet (5 mg total) by mouth 3 (three) times daily as needed for muscle spasms. 30 tablet 0   docusate sodium  (COLACE) 100 MG capsule Take 1 capsule (100 mg total) by mouth 2 (two) times daily. 30 capsule 0   ezetimibe (ZETIA) 10 MG tablet Take 1 tablet (10 mg total) by mouth daily. 90 tablet 3   famotidine (PEPCID) 20 MG tablet Take 20 mg by mouth daily.     fluticasone (FLONASE) 50 MCG/ACT nasal spray Place 1 spray into both nostrils daily as needed for allergies or rhinitis.     Fluticasone-Umeclidin-Vilant (TRELEGY ELLIPTA) 200-62.5-25 MCG/ACT AEPB Inhale 1 puff into the lungs daily. 180 each 0   Fluticasone-Umeclidin-Vilant (TRELEGY ELLIPTA) 200-62.5-25 MCG/ACT AEPB Inhale 1 puff into the lungs daily. 14 each 0   furosemide (LASIX) 20 MG tablet Take 1 tablet by mouth every third day as directed. 30 tablet 2   gabapentin (NEURONTIN) 600 MG tablet Take 1 tablet (600 mg total) by mouth 3 (three) times daily. 270 tablet 1   guaiFENesin-codeine (ROBITUSSIN AC) 100-10 MG/5ML syrup Take 5 mLs by mouth 3 (three) times daily as needed for cough. 120 mL 0   HYDROcodone-acetaminophen (NORCO/VICODIN) 5-325 MG tablet Take 1-2 tablets by mouth every 4 (four) hours as needed for moderate pain. 30 tablet 0   isosorbide mononitrate (IMDUR) 30 MG 24 hr tablet Take 1 tablet (30 mg total) by mouth 2 (two) times daily. 60 tablet  3   losartan (COZAAR) 25 MG tablet TAKE 1 TABLET BY MOUTH DAILY 90 tablet 0   mupirocin ointment (BACTROBAN) 2 % Apply 1 Application topically 2 (two) times daily. 22 g 0   nitroGLYCERIN (NITROSTAT) 0.4 MG SL tablet Place 1 tablet (0.4 mg total) under the tongue every 5 (five) minutes as needed for chest pain. 25 tablet 3   polyethylene glycol (MIRALAX / GLYCOLAX) 17 g packet Take 17 g by mouth daily as needed for mild constipation.      ranolazine (RANEXA) 500 MG 12 hr tablet TAKE ONE (1) TABLET BY MOUTH TWO TIMES PER DAY 180 tablet 3   silodosin (RAPAFLO) 8 MG CAPS capsule TAKE ONE CAPSULE BY MOUTH DAILY WITH BREAKFAST. 30 capsule 0   No facility-administered medications prior to visit.      Per HPI unless specifically indicated in ROS section below Review of Systems  Objective:  BP 124/62   Pulse 60   Temp (!) 97.2 F (36.2 C) (Temporal)   Ht 6' (1.829 m)   Wt 211 lb (95.7 kg)   SpO2 96%   BMI 28.62 kg/m   Wt Readings from Last 3 Encounters:  01/03/23 211 lb (95.7 kg)  12/27/22 210 lb 4 oz (95.4 kg)  12/24/22 208 lb (94.3 kg)      Physical Exam Vitals and nursing note reviewed.  Constitutional:      Appearance: Normal appearance. He is not ill-appearing.  Musculoskeletal:        General: No tenderness. Normal range of motion.     Comments: FROM left knee   Skin:    General: Skin is warm and dry.     Findings: Erythema (mild) and wound present. No rash.     Comments: 5cm x 0.5cm open sore to anterior leg distal to knee, with granulation tissue, healing via second intention. No significant drainage, minimal erythema surrounding wound. Indurated wound edges.   Neurological:     Mental Status: He is alert.  Psychiatric:        Mood and Affect: Mood normal.        Behavior: Behavior normal.        Wound care: Wound irrigated with normal saline, then mupirocin ointment (pt brought from home) applied followed by non stick gauze, kerlex and ace wrap.      Assessment & Plan:   Problem List Items Addressed This Visit     Traumatic open wound of lower leg with delayed healing, left - Primary    Slowed healing. Send wound culture.  Continue mupirocin topically.  RTC 1 wk f/u visit. Discussed possible wound clinic eval if not healing as expected by next visit.       Relevant Orders   WOUND CULTURE     No orders of the defined types were placed in this encounter.   Orders Placed This Encounter  Procedures   WOUND CULTURE    Order Specific Question:   Source    Answer:   left anterior leg wound    Patient Instructions  Wound cultured today - we will be in touch with results.  No further antibiotic for now awaiting culture results.   Follow  up plan: Return in about 1 week (around 01/10/2023), or if symptoms worsen or fail to improve, for follow up visit.  Eustaquio Boyden, MD

## 2023-01-03 NOTE — Progress Notes (Unsigned)
BCG Bladder Instillation  BCG # 1/6  Due to Bladder Cancer patient is present today for a BCG treatment. Patient was cleaned and prepped in a sterile fashion with betadine. A 14 FR catheter was inserted, urine return was noted 100 ml, urine was yellow in color.  50ml of reconstituted BCG was instilled into the bladder. The catheter was then removed. Patient tolerated well, no complications were noted  Performed by: Michiel Cowboy, PA-C and Wynona Dove, RN  Follow up/ Additional notes: Next week for #2/6 BCG.  He also informing that he has issues with holding the BCG and that OAB medication has helped him in the past.  I gave him Gemtesa 75 mg #42 samples to take daily while he is going through his BCG treatments.

## 2023-01-03 NOTE — Patient Instructions (Addendum)
Wound cultured today - we will be in touch with results.  No further antibiotic for now awaiting culture results.

## 2023-01-04 ENCOUNTER — Ambulatory Visit (INDEPENDENT_AMBULATORY_CARE_PROVIDER_SITE_OTHER): Payer: HMO | Admitting: Urology

## 2023-01-04 ENCOUNTER — Encounter: Payer: Self-pay | Admitting: Urology

## 2023-01-04 ENCOUNTER — Other Ambulatory Visit: Payer: Self-pay | Admitting: Family Medicine

## 2023-01-04 ENCOUNTER — Ambulatory Visit: Payer: HMO | Admitting: Family Medicine

## 2023-01-04 VITALS — BP 120/60 | HR 70 | Ht 72.0 in | Wt 206.0 lb

## 2023-01-04 DIAGNOSIS — C679 Malignant neoplasm of bladder, unspecified: Secondary | ICD-10-CM | POA: Diagnosis not present

## 2023-01-04 DIAGNOSIS — N3941 Urge incontinence: Secondary | ICD-10-CM

## 2023-01-04 DIAGNOSIS — D494 Neoplasm of unspecified behavior of bladder: Secondary | ICD-10-CM

## 2023-01-04 LAB — MICROSCOPIC EXAMINATION

## 2023-01-04 LAB — URINALYSIS, COMPLETE
Bilirubin, UA: NEGATIVE
Glucose, UA: NEGATIVE
Ketones, UA: NEGATIVE
Leukocytes,UA: NEGATIVE
Nitrite, UA: NEGATIVE
Protein,UA: NEGATIVE
RBC, UA: NEGATIVE
Specific Gravity, UA: 1.015 (ref 1.005–1.030)
Urobilinogen, Ur: 1 mg/dL (ref 0.2–1.0)
pH, UA: 5 (ref 5.0–7.5)

## 2023-01-04 MED ORDER — BCG LIVE 50 MG IS SUSR
3.2400 mL | Freq: Once | INTRAVESICAL | Status: AC
Start: 2023-01-04 — End: 2023-01-04
  Administered 2023-01-04: 81 mg via INTRAVESICAL

## 2023-01-04 MED ORDER — GEMTESA 75 MG PO TABS
75.0000 mg | ORAL_TABLET | Freq: Every day | ORAL | 0 refills | Status: DC
Start: 2023-01-04 — End: 2023-08-02

## 2023-01-04 NOTE — Telephone Encounter (Signed)
Gabapentin Last filled:  10/07/22, #270 Last OV:  01/03/23, wound chk Next OV:  01/10/23, wound chk

## 2023-01-05 LAB — WOUND CULTURE: RESULT:: NO GROWTH

## 2023-01-06 ENCOUNTER — Other Ambulatory Visit
Admission: RE | Admit: 2023-01-06 | Discharge: 2023-01-06 | Disposition: A | Discharge status: Home / Self Care | Payer: HMO | Attending: Physician Assistant | Admitting: Physician Assistant

## 2023-01-06 ENCOUNTER — Ambulatory Visit: Payer: HMO | Attending: Physician Assistant | Admitting: Physician Assistant

## 2023-01-06 ENCOUNTER — Encounter: Payer: Self-pay | Admitting: Physician Assistant

## 2023-01-06 VITALS — BP 136/60 | HR 72 | Ht 72.0 in | Wt 212.0 lb

## 2023-01-06 DIAGNOSIS — I255 Ischemic cardiomyopathy: Secondary | ICD-10-CM | POA: Insufficient documentation

## 2023-01-06 DIAGNOSIS — I10 Essential (primary) hypertension: Secondary | ICD-10-CM | POA: Diagnosis not present

## 2023-01-06 DIAGNOSIS — H349 Unspecified retinal vascular occlusion: Secondary | ICD-10-CM | POA: Insufficient documentation

## 2023-01-06 DIAGNOSIS — R9439 Abnormal result of other cardiovascular function study: Secondary | ICD-10-CM | POA: Diagnosis not present

## 2023-01-06 DIAGNOSIS — I5022 Chronic systolic (congestive) heart failure: Secondary | ICD-10-CM

## 2023-01-06 DIAGNOSIS — E785 Hyperlipidemia, unspecified: Secondary | ICD-10-CM | POA: Insufficient documentation

## 2023-01-06 DIAGNOSIS — I739 Peripheral vascular disease, unspecified: Secondary | ICD-10-CM | POA: Diagnosis not present

## 2023-01-06 DIAGNOSIS — I25110 Atherosclerotic heart disease of native coronary artery with unstable angina pectoris: Secondary | ICD-10-CM | POA: Insufficient documentation

## 2023-01-06 LAB — BASIC METABOLIC PANEL
Anion gap: 9 (ref 5–15)
BUN: 29 mg/dL — ABNORMAL HIGH (ref 8–23)
CO2: 22 mmol/L (ref 22–32)
Calcium: 8.6 mg/dL — ABNORMAL LOW (ref 8.9–10.3)
Chloride: 107 mmol/L (ref 98–111)
Creatinine, Ser: 1.32 mg/dL — ABNORMAL HIGH (ref 0.61–1.24)
GFR, Estimated: 54 mL/min — ABNORMAL LOW (ref >60)
Glucose, Bld: 112 mg/dL — ABNORMAL HIGH (ref 70–99)
Potassium: 4.2 mmol/L (ref 3.5–5.1)
Sodium: 138 mmol/L (ref 135–145)

## 2023-01-06 LAB — CBC
HCT: 38.3 % — ABNORMAL LOW (ref 39.0–52.0)
Hemoglobin: 13 g/dL (ref 13.0–17.0)
MCH: 32.8 pg (ref 26.0–34.0)
MCHC: 33.9 g/dL (ref 30.0–36.0)
MCV: 96.7 fL (ref 80.0–100.0)
Platelets: 182 10*3/uL (ref 150–400)
RBC: 3.96 MIL/uL — ABNORMAL LOW (ref 4.22–5.81)
RDW: 12.8 % (ref 11.5–15.5)
WBC: 6.5 10*3/uL (ref 4.0–10.5)
nRBC: 0 % (ref 0.0–0.2)

## 2023-01-06 LAB — WOUND CULTURE: MICRO NUMBER:: 14984554

## 2023-01-06 MED ORDER — DIPHENHYDRAMINE HCL 50 MG PO CAPS: ORAL_CAPSULE | ORAL | 0 refills | Status: DC

## 2023-01-06 MED ORDER — ISOSORBIDE MONONITRATE ER 30 MG PO TB24: 30.0000 mg | ORAL_TABLET | Freq: Every day | ORAL | 3 refills | Status: DC

## 2023-01-06 MED ORDER — PREDNISONE 50 MG PO TABS: ORAL_TABLET | ORAL | 0 refills | Status: DC

## 2023-01-06 NOTE — H&P (View-Only) (Signed)
 Cardiology Office Note    Date:  01/06/2023   ID:  [name], DOB 04/29/1941, MRN [id number]  PCP:  Gutierrez, Javier, MD  Cardiologist:  Timothy Gollan, MD  Electrophysiologist:  None   Chief Complaint: Follow-up  History of Present Illness:   [name] is a 82 y.o. male with history of CAD s/p remote 2-vessel CABG in 1990 with subsequent PCI/BMS to the LCx in 2002, HFrEF secondary to ICM, PAD, bladder cancer status post TURB in 11/2022, undergoing BCG, nonobstructive carotid artery disease, left retinal artery occlusion, peripheral neuropathy, COPD, DM2, HTN, HLD, spinal stenosis with chronic lower extremity pain and neuropathy, DVT of the left lower extremity, BPH, GERD, and OSA who presents for follow-up of HFrEF and abnormal Lexiscan MPI.  [name] underwent two-vessel CABG in 1990.  LHC in 2002 with successful PCI/BMS to the LCx.  LHC from 06/2015 showed 2 of 2 patent grafts with a patent LCx stent.  He is known to have small vessel and diffuse RCA disease which has been medically managed with long-acting nitrate.  In 08/2018, he underwent lower extremity arterial angiography, in the setting of ongoing lower extremity pain and abnormal duplex.  This showed diffuse and calcified bilateral SFA disease with three-vessel runoff below the knee bilaterally.  Medical therapy was recommended.  He was admitted to the hospital in 05/2019 with COVID.  Echo at that time demonstrated new LV systolic dysfunction with an EF of 35 to 40%.  Follow-up echo in early 2021 showed improvement in LV systolic function with an EF of 50 to 55%.  He had a low risk stress test in 05/2020 which was performed prior to back surgery.  In 03/2021 he complained of chest tightness and dyspnea.  Subsequent echo demonstrated an EF of 50 to 55% with normal RV systolic function and a PASP of 44.7 mmHg.  He subsequently underwent Lexiscan MPI in 04/2021, in the setting of admission for unstable angina and dyspnea,  which was low risk and without evidence of ischemia.  PFTs in 03/2021 showed mild to moderate obstructive lung disease.  Carotid artery ultrasound in 11/2022 showed less than 50% bilateral ICA stenosis.  He had been treated for COPD exacerbation/bronchitis with antibiotics.  During this time, he underwent echo on 11/22/2022, which demonstrated an EF of 40 to 45%, global hypokinesis, grade 1 diastolic dysfunction, normal RV systolic function and ventricular cavity size, mildly elevated PASP estimated at 39.8 mmHg, mildly dilated left atrium, mild mitral regurgitation, mild aortic stenosis with a mean gradient of 12 mmHg, and an estimated right atrial pressure of 3 mmHg.  Lexiscan MPI on 12/27/2022 was high risk with a moderate in size, severe, partially reversible basal and mid inferior/inferolateral defect consistent with ischemia and an element of scar.  LVEF 25 to 35%.  CT imaging showed post CABG findings with coronary artery calcification and aortic atherosclerosis.  A 1.4 cm hypodensity was noted in the liver similar to CT from 09/2021.  When compared to study from 2022, the inferior/inferolateral defect and reduced LVEF were new.  Given abnormal findings, it was recommended the patient increase Imdur to 30 mg twice daily and appointment to be scheduled for today.  He comes in today noting a 2-month history of exertional shortness of breath, fatigue, and chest discomfort.  He reports he is only able to work in his garden for approximately 6 to 10 minutes before needing to stop secondary to shortness of breath and fatigue "in my chest."  No   dizziness, presyncope, or syncope.  Reports increase in headaches following titration of Imdur.  Currently chest pain-free.   Labs independently reviewed: 09/2022 - TC 105, TG 73, HDL 41, LDL 49, potassium 4.6, BUN 25, serum creatinine 1.28, albumin 4.2, AST/ALT normal, A1c 6.1, Hgb 13.0, PLT 150 02/2021 - TSH normal  Past Medical History:  Diagnosis Date   (HFimpEF) heart  failure with improved ejection fraction (HCC)    a. 06/2019 Echo (in setting of COVID): EF 35-40%, mild LVH, g1 DD, glob HK; b. 09/2019 Echo: EF 50-55%, Gr1 DD; c. 03/2021 Echo: EF 50-55%, no rwma, mild LVH, GrI DD, nl RV fxn. RVSP 44.7mmHg. Mild-mod dil LA. Triv MR. Mild-mod Ao sclerosis.   Actinic keratosis    Anginal pain (HCC)    Aortic ectasia, abdominal (HCC)    a.) CT abd 01/01/2022: 2.9 cm infrarenal abdominal aortic ectasia   Bladder cancer (HCC) 2023   BPH (benign prostatic hyperplasia) 09/06/2007   CAD s/p CABG    a.) 1990 s/p MI--> CABG x 3; b.) LHC/PCI 2002 --> BMS (unk type) to LCX; c.) LHC 07/07/2015: 50% oLM-LM, 80% oRCA, 80% pRCA-1, 80% pRCA-2, 60% dLAD, 100% o-pLAD, 60% mLAD, 50% D2, LIMA-LAD, SVG-D2, SVG-RI --> med Rx; d.) MV 06/05/2020: fixed apical ant/apical defect. No sign ischemia; e.) MV 09/08/202: EF 52%, no ischemia.   Carotid arterial disease (HCC)    a.) 08/2016 Carotid U/S: <39% bilat; b.) carotid doppler 10/12/2022: mild-to-moderate (<50%) bilateral bifurcation plaque.   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   CKD (chronic kidney disease), stage III (HCC)    Community acquired pneumonia of right lower lobe of lung 06/05/2017   Diverticulosis    Dupuytren's contracture of right hand 10/29/2008   DVT (deep venous thrombosis) (HCC) 1998   Elevated prostate specific antigen (PSA) 07/09/2008   Emphysema of lung (HCC)    GERD 04/30/2007   Heart murmur    History of 2019 novel coronavirus disease (COVID-19) 05/20/2019   History of hiatal hernia    History of shingles    HLD (hyperlipidemia) 04/26/2007   HTN (hypertension) 04/30/2007   Laceration of skin of left hand 03/08/2018   LBBB (left bundle branch block)    Lumbar disc disease with radiculopathy    Migraines    Myocardial infarction (HCC) 1990   a.) resulted in 3v CABG   Myocardial infarction (HCC) 2002   a.) second cardiac event; PCI with placement of BMS (unknown type)  to LCx   Neuropathy of both feet    OSA on CPAP    Osteoarthritis    PAD (peripheral artery disease) (HCC)    a. 07/2017 LE duplex: RSFA 75-99m, LSFA 75-99m, 50-74d; c. 08/2018 Periph Angio: No signif AoIliac dzs. Mod-sev Ca2+ RSFA w/ diff dzs throughout- 3 vessel runoff. Borderline signif LSFA dzs w/ mod-sev Ca2+ vessels and 3 vessel runoff below the knee-->Med rx.   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   Right mid dorsum forearm - in situ - [facility]    Past Surgical History:  Procedure Laterality Date   ABDOMINAL AORTOGRAM W/LOWER EXTREMITY [name] 09/12/2018   Procedure: ABDOMINAL AORTOGRAM W/LOWER EXTREMITY;  Surgeon: Arida, Muhammad   A, MD;  Location: [facility];  Service: Cardiovascular;  Laterality: [name];   ANTERIOR CERVICAL DECOMP/DISCECTOMY FUSION [name] 07/11/2022   Anterior Cervical Decompression Fusion ,Interboy Prothesis,Plate/Screws , Cervical four-five,Cervical five-six (Jenkins, Jeffrey, MD)   APPENDECTOMY     rupture   BACK SURGERY     1984 and then another one at [facility]   BLADDER INSTILLATION [name] 10/19/2021   Procedure: BLADDER INSTILLATION OF GEMCITABINE;  Surgeon: Stoioff, Scott C, MD;  Location: [facility];  Service: Urology;  Laterality: [name];   BLADDER INSTILLATION [name] 11/15/2022   Procedure: BLADDER INSTILLATION OF GEMCITABINE;  Surgeon: Stoioff, Scott C, MD;  Location: [facility];  Service: Urology;  Laterality: [name];   CARDIAC CATHETERIZATION [name] 07/07/2015   Procedure: Left Heart Cath and Cors/Grafts Angiography;  Surgeon: Timothy J Gollan, MD;  Location: [facility];  Service: Cardiovascular;  Laterality: [name];   CATARACT EXTRACTION, BILATERAL  2009   CERVICAL SPINE SURGERY  12/2016   cervical stenosis  (Jenkins)   COLONOSCOPY  03/2010   HP polyp, diverticulosis, rpt 10 yrs ([name])   CORONARY ANGIOPLASTY  11/30/2000   LCX   CORONARY ARTERY BYPASS GRAFT  1990   3 vessel    CYSTOSCOPY  12/23/2010   [facility]   JOINT REPLACEMENT     KNEE ARTHROSCOPY Right    x2   LAMINOTOMY  1986   L5/S1 lumbar laminotomy for two ruptured discs/fusion   LUMBAR LAMINECTOMY/DECOMPRESSION MICRODISCECTOMY [name] 07/13/2016   Procedure: LUMBAR TWO-THREE, LUMBAR THREE-FOUR, LUMBAR FOUR-FIVE LAMINECTOMY AND FORAMINOTOMY;  Surgeon: Jeffrey Jenkins, MD;  Location: [facility];  Service: Neurosurgery;  Laterality: [name];  LAMINECTOMY AND FORAMINOTOMY L2-L3, L3-L4,L4-L5   LUMBAR SPINE SURGERY  06/2020   Bilateral redo laminectomy/laminotomy/foraminotomies/medial facetectomy to decompress the bilateral L4 and L5 nerve roots (Jenkins)   TOTAL HIP ARTHROPLASTY Bilateral 1999,2000   TOTAL KNEE ARTHROPLASTY Right 03/06/2017   Procedure: RIGHT TOTAL KNEE ARTHROPLASTY;  Surgeon: Aluisio, Frank, MD;  Location: [facility];  Service: Orthopedics;  Laterality: Right;   TRANSURETHRAL RESECTION OF BLADDER TUMOR [name] 10/19/2021   Procedure: TRANSURETHRAL RESECTION OF BLADDER TUMOR (TURBT);  Surgeon: Stoioff, Scott C, MD;  Location: [facility];  Service: Urology;  Laterality: [name];   TRANSURETHRAL RESECTION OF BLADDER TUMOR [name] 11/15/2022   Procedure: TRANSURETHRAL RESECTION OF BLADDER TUMOR (TURBT);  Surgeon: Stoioff, Scott C, MD;  Location: [facility];  Service: Urology;  Laterality: [name];    Current Medications: Current Meds  Medication Sig   albuterol (PROVENTIL) (2.5 MG/3ML) 0.083% nebulizer solution Take 3 mLs (2.5 mg total) by nebulization every 6 (six) hours as needed for wheezing or shortness of breath.   albuterol (VENTOLIN HFA) 108 (90 Base) MCG/ACT inhaler TAKE 2 PUFFS INTO LUNGS EVERY 6 HOURS ASNEEDED FOR WHEEZING OR SHORTNESS OF BREATH   aspirin EC 81 MG EC tablet Take 1 tablet (81 mg total) by mouth daily.   atorvastatin (LIPITOR) 20 MG  tablet TAKE 1 TABLET BY MOUTH DAILY.   Azelastine HCl (ASTEPRO) 0.15 % SOLN Place 1 spray into the nose at bedtime as needed (Sinus congestion, allergies).   carvedilol (COREG) 3.125 MG tablet Take 1 tablet (3.125 mg total) by mouth 2 (two) times daily.   cyclobenzaprine (FLEXERIL) 5 MG tablet Take 1 tablet (5 mg total) by mouth 3 (three) times daily as needed for muscle spasms.   diphenhydrAMINE (BENADRYL) 50 MG capsule Take one capsule 1 hour prior to scan.   docusate sodium (COLACE) 100 MG capsule Take 1 capsule (100 mg total) by   mouth 2 (two) times daily.   ezetimibe (ZETIA) 10 MG tablet Take 1 tablet (10 mg total) by mouth daily.   famotidine (PEPCID) 20 MG tablet Take 20 mg by mouth daily.   fluticasone (FLONASE) 50 MCG/ACT nasal spray Place 1 spray into both nostrils daily as needed for allergies or rhinitis.   Fluticasone-Umeclidin-Vilant (TRELEGY ELLIPTA) 200-62.5-25 MCG/ACT AEPB Inhale 1 puff into the lungs daily.   furosemide (LASIX) 20 MG tablet Take 1 tablet by mouth every third day as directed.   gabapentin (NEURONTIN) 600 MG tablet TAKE ONE TABLET BY MOUTH 3 TIMES DAILY   losartan (COZAAR) 25 MG tablet TAKE 1 TABLET BY MOUTH DAILY   mupirocin ointment (BACTROBAN) 2 % Apply 1 Application topically 2 (two) times daily.   nitroGLYCERIN (NITROSTAT) 0.4 MG SL tablet Place 1 tablet (0.4 mg total) under the tongue every 5 (five) minutes as needed for chest pain.   polyethylene glycol (MIRALAX / GLYCOLAX) 17 g packet Take 17 g by mouth daily as needed for mild constipation.    predniSONE (DELTASONE) 50 MG tablet Take one tablet 13 hours, 7 hours, and 1 hour prior to scan.   ranolazine (RANEXA) 500 MG 12 hr tablet TAKE ONE (1) TABLET BY MOUTH TWO TIMES PER DAY   silodosin (RAPAFLO) 8 MG CAPS capsule TAKE ONE CAPSULE BY MOUTH DAILY WITH BREAKFAST.   Vibegron (GEMTESA) 75 MG TABS Take 1 tablet (75 mg total) by mouth daily.   [DISCONTINUED] isosorbide mononitrate (IMDUR) 30 MG 24 hr tablet  Take 1 tablet (30 mg total) by mouth 2 (two) times daily.    Allergies:   Vioxx [rofecoxib], Spiriva respimat [tiotropium bromide monohydrate], Contrast media [iodinated contrast media], and Morphine   Social History   Socioeconomic History   Marital status: Married    Spouse name: Nancy   Number of children: 1   Years of education: Not on file   Highest education level: 12th grade  Occupational History   Not on file  Tobacco Use   Smoking status: Former    Packs/day: 1.00    Years: 25.00    Additional pack years: 0.00    Total pack years: 25.00    Types: Cigarettes    Quit date: 08/15/1988    Years since quitting: 34.4    Passive exposure: Past   Smokeless tobacco: Never  Vaping Use   Vaping Use: Never used  Substance and Sexual Activity   Alcohol use: No    Alcohol/week: 0.0 standard drinks of alcohol   Drug use: No   Sexual activity: Yes  Other Topics Concern   Not on file  Social History Narrative   Married, wife Nancy, for 25 years in 2016. 1 daughter and 3 grandkids from first marriage.    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    Activity: part time farmer, raise goats and chickens. 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    Diet: good water, fruits/vegetables daily      OSA eval recommended while hospitalized with Covid 05/2019   Social Determinants of Health   Financial Resource Strain: Low Risk  (01/02/2023)   Overall Financial Resource Strain (CARDIA)    Difficulty of Paying Living Expenses: Not hard at all  Food Insecurity: No Food Insecurity (01/02/2023)   Hunger Vital Sign    Worried About Running Out of Food in the Last Year: Never true    Ran Out of Food in the Last Year: Never true    Transportation Needs: No Transportation Needs (01/02/2023)   PRAPARE - Transportation    Lack of Transportation (Medical): No    Lack of Transportation (Non-Medical): No  Physical Activity: Unknown (01/02/2023)   Exercise Vital  Sign    Days of Exercise per Week: Patient declined    Minutes of Exercise per Session: 30 min  Stress: No Stress Concern Present (01/02/2023)   [employer] - Occupational Stress Questionnaire    Feeling of Stress : Not at all  Social Connections: Socially Integrated (01/02/2023)   Social Connection and Isolation Panel [NHANES]    Frequency of Communication with Friends and Family: More than three times a week    Frequency of Social Gatherings with Friends and Family: More than three times a week    Attends Religious Services: 1 to 4 times per year    Active Member of Clubs or Organizations: Yes    Attends Club or Organization Meetings: More than 4 times per year    Marital Status: Married     Family History:  The patient's family history includes Cancer in his sister and another family member; Diabetes in his brother and mother; Heart attack in his mother; Heart disease in his father; Polycystic kidney disease in his father and sister; Stroke in his father and mother.  ROS:   12-point review of systems is negative unless otherwise noted in the HPI.   EKGs/Labs/Other Studies Reviewed:    Studies reviewed were summarized above. The additional studies were reviewed today:  Lexiscan MPI 12/27/2022:   Abnormal pharmacologic myocardial perfusion stress test.   There is a moderate in size, severe, partially reversible basal and mid inferior/inferolateral defect consistent with ischemia and an element of scar.   Left ventricular systolic function is severely reduced (LVEF 25-35%).   Post-CABG findings, coronary artery calcification, and aortic atherosclerosis are noted on the attenuation correction CT.   1.4 cm hypodensity is noted in the liver, similar to the prior abdominal CT from 10/04/2021.   Compared with the prior study from 04/22/2021, inferior/inferolateral defect and reduced LVEF are new.   This is a high-risk study. __________  2D echo 11/22/2022: 1.  Left ventricular ejection fraction, by estimation, is 40 to 45%. Left  ventricular ejection fraction by 3D volume is 42 %. The left ventricle has  mildly decreased function. The left ventricle demonstrates global  hypokinesis. Left ventricular diastolic   parameters are consistent with Grade I diastolic dysfunction (impaired  relaxation). The average left ventricular global longitudinal strain is  -10.2 %.   2. Right ventricular systolic function is normal. The right ventricular  size is normal. There is mildly elevated pulmonary artery systolic  pressure. The estimated right ventricular systolic pressure is 39.8 mmHg.   3. Left atrial size was mildly dilated.   4. The mitral valve is normal in structure. Mild mitral valve  regurgitation. No evidence of mitral stenosis.   5. The aortic valve is normal in structure. Aortic valve regurgitation is  not visualized. Mild aortic valve stenosis. Aortic valve mean gradient  measures 12.0 mmHg.   6. The inferior vena cava is normal in size with greater than 50%  respiratory variability, suggesting right atrial pressure of 3 mmHg.   Comparison(s): 03/23/21 50-55%, Mild LVH, RVSP 45.  __________  Carotid artery ultrasound 10/12/2022: IMPRESSION: Mild-to-moderate bilateral carotid bifurcation plaque with Doppler measurements indicative of less than 50% stenosis. __________  Lexiscan MPI 04/2021: Pharmacological myocardial perfusion imaging study with no significant  ischemia Normal wall   motion, EF estimated at 52% No EKG changes concerning for ischemia at peak stress or in recovery. Low risk scan __________   2D echo 03/2021: 1. Left ventricular ejection fraction, by estimation, is 50 to 55%. The  left ventricle has low normal function. The left ventricle has no regional  wall motion abnormalities. There is mild left ventricular hypertrophy.  Left ventricular diastolic  parameters are consistent with Grade I diastolic dysfunction (impaired   relaxation).   2. Right ventricular systolic function is normal. The right ventricular  size is normal. There is mildly elevated pulmonary artery systolic  pressure. The estimated right ventricular systolic pressure is 44.7 mmHg.   3. Left atrial size was mild to moderately dilated.   4. The mitral valve is normal in structure. Trivial mitral valve  regurgitation. No evidence of mitral stenosis.   5. The aortic valve is normal in structure. Aortic valve regurgitation is  not visualized. Mild to moderate aortic valve sclerosis/calcification is  present, without any evidence of aortic stenosis. __________  See Epic for more remote studies   EKG:  EKG is not ordered today.    Recent Labs: 10/07/2022: ALT 14 01/06/2023: BUN 29; Creatinine, Ser 1.32; Hemoglobin 13.0; Platelets 182; Potassium 4.2; Sodium 138  Recent Lipid Panel    Component Value Date/Time   CHOL 105 10/07/2022 1150   TRIG 73.0 10/07/2022 1150   HDL 41.30 10/07/2022 1150   CHOLHDL 3 10/07/2022 1150   VLDL 14.6 10/07/2022 1150   LDLCALC 49 10/07/2022 1150   LDLDIRECT 106.0 01/01/2016 0844    PHYSICAL EXAM:    VS:  BP 136/60 (BP Location: Left Arm, Patient Position: Sitting, Cuff Size: Normal)   Pulse 72   Ht 6' (1.829 m)   Wt 212 lb (96.2 kg)   SpO2 94%   BMI 28.75 kg/m   BMI: Body mass index is 28.75 kg/m.  Physical Exam Vitals reviewed.  Constitutional:      Appearance: He is well-developed.  HENT:     Head: Normocephalic and atraumatic.  Eyes:     General:        Right eye: No discharge.        Left eye: No discharge.  Neck:     Vascular: No JVD.  Cardiovascular:     Rate and Rhythm: Normal rate and regular rhythm.     Heart sounds: S1 normal and S2 normal. Heart sounds not distant. No midsystolic click and no opening snap. Murmur heard.     Systolic murmur is present with a grade of 1/6 at the upper right sternal border.     No friction rub.  Pulmonary:     Effort: Pulmonary effort is normal.  No respiratory distress.     Breath sounds: Normal breath sounds. No decreased breath sounds, wheezing or rales.  Chest:     Chest wall: No tenderness.  Abdominal:     General: There is no distension.  Musculoskeletal:     Cervical back: Normal range of motion.     Comments: Trivial bilateral pretibial edema.  Reports this is at baseline.  Skin:    General: Skin is warm and dry.     Nails: There is no clubbing.  Neurological:     Mental Status: He is alert and oriented to person, place, and time.  Psychiatric:        Speech: Speech normal.        Behavior: Behavior normal.        Thought Content: Thought   content normal.        Judgment: Judgment normal.     Wt Readings from Last 3 Encounters:  01/06/23 212 lb (96.2 kg)  01/04/23 206 lb (93.4 kg)  01/03/23 211 lb (95.7 kg)     ASSESSMENT & PLAN:   CAD status post CABG status post PCI with unstable angina and abnormal Lexiscan MPI: Reports a 2-month history of increasing exertional dyspnea, chest discomfort, and fatigue.  Recently found to have recurrence of cardiomyopathy on echo with subsequent Lexiscan MPI demonstrating a moderate-sized, severe, partially reversible basal and mid inferior/inferolateral defect consistent with ischemia and an element of scar which was new when compared to study from 2022.  Overall, this was a high risk study.  He has not tolerated titration of Imdur secondary to headache.  Given abnormal Lexiscan MPI and in the setting of ongoing exertional symptoms we will proceed with diagnostic R/LHC.  Patient will be premedicated for contrast media allergy.  Reduce Imdur to 30 mg daily.  Continue aspirin 81 mg daily, atorvastatin, ezetimibe, carvedilol, and ranolazine.  HFrEF secondary to ICM: Euvolemic and well compensated.  Continue carvedilol, losartan, and current dose of furosemide.  Based on cath results, would look to further escalate GDMT with possible transitioning from ARB to ARNI.  Would defer addition  of SGLT2 inhibitor at this time given bladder cancer treatment at this time.  Look to repeat echo in several months time following ischemic evaluation and optimization of GDMT to reevaluate LV systolic function.  HTN: Blood pressure is reasonably controlled in the office today.  Continue medical therapy as outlined above.  HLD: LDL 49 in 09/2022.  Remains on atorvastatin sent in by.  PAD: Stable, no lifestyle limiting claudication.  Remains on aspirin, statin, and ezetimibe.  Followed by Dr. Arida.  Carotid artery disease: Less than 50% bilateral ICA stenosis on imaging in 11/2022. Left retinal artery occlusion: Patient improved.  Followed by ophthalmology.  Echo without evidence of cardiac emboli.  Carotid artery ultrasound without obstructive disease.  Plan for Zio patch following cardiac cath.   Shared Decision Making/Informed Consent{  The risks [stroke (1 in 1000), death (1 in 1000), kidney failure [usually temporary] (1 in 500), bleeding (1 in 200), allergic reaction [possibly serious] (1 in 200)], benefits (diagnostic support and management of coronary artery disease) and alternatives of a cardiac catheterization were discussed in detail with Mr. Breed and he is willing to proceed.     Disposition: F/u with Dr. Gollan or an APP 1-2 weeks post R/LHC.   Medication Adjustments/Labs and Tests Ordered: Current medicines are reviewed at length with the patient today.  Concerns regarding medicines are outlined above. Medication changes, Labs and Tests ordered today are summarized above and listed in the Patient Instructions accessible in Encounters.   Signed, Edwin Cherian, PA-C 01/06/2023 9:56 AM     [facility] [place] [address] [place], [place] [address] [phone] 

## 2023-01-06 NOTE — Patient Instructions (Signed)
Medication Instructions:  Your physician has recommended you make the following change in your medication:   DECREASE Isosorbide mononitrate (Imdur) to 30 mg once daily   *If you need a refill on your cardiac medications before your next appointment, please call your pharmacy*   Lab Work: CBC & BMET today over at the [facility]. Stop at registration desk to check in.   If you have labs (blood work) drawn today and your tests are completely normal, you will receive your results only by: MyChart Message (if you have MyChart) OR A paper copy in the mail If you have any lab test that is abnormal or we need to change your treatment, we will call you to review the results.   Testing/Procedures:  [place] [facility] [place] [address] [place] [address] Dept: [phone] Loc: [phone]  ASA HARSHA  01/06/2023  You are scheduled for a Cardiac Catheterization on Friday, May 31 with Dr. Lorine Bears.  1. Please arrive at the [facility] Entrance of [facility], [address], [place] [address] at 10:00 AM (This is 1 hour(s) prior to your procedure time).  Proceed to the Check-In Desk directly inside the entrance.  Procedure Parking: Use the entrance off of the [address] side of the hospital. Turn right upon entering and follow the driveway to parking that is directly in front of the [facility]. There is no valet parking available at this entrance, however there is an awning directly in front of the [facility] Center for drop off/ pick up for patients.  Special note: Every effort is made to have your procedure done on time. Please understand that emergencies sometimes delay scheduled procedures.  2. Diet: Do not eat solid foods after midnight.  The patient may have clear liquids until 5am upon the day of the procedure.  3. Labs: You  will need to have blood drawn today.   4. Medication instructions in preparation for your procedure:   Contrast Allergy: Yes,   Please take Prednisone 50mg  by mouth at:  Thirteen hours prior to cath  Seven hours prior to cath  And prior to leaving home please take last dose of Prednisone 50mg  and Benadryl 50mg  by mouth.   Stop taking, Lasix (Furosemide)  Thursday, May 30, and restart the day after.    On the morning of your procedure, take your Aspirin 81 mg and any morning medicines NOT listed above.  You may use sips of water.  5. Plan to go home the same day, you will only stay overnight if medically necessary. 6. Bring a current list of your medications and current insurance cards. 7. You MUST have a responsible person to drive you home. 8. Someone MUST be with you the first 24 hours after you arrive home or your discharge will be delayed. 9. Please wear clothes that are easy to get on and off and wear slip-on shoes.  Thank you for allowing [place] to care for you!   -- [facility]    Follow-Up: At [facility], you and your health needs are our priority.  As part of our continuing mission to provide you with exceptional heart care, we have created designated Provider Care Teams.  These Care Teams include your primary Cardiologist (physician) and Advanced Practice Providers (APPs -  Physician Assistants and Nurse Practitioners) who all work  together to provide you with the care you need, when you need it.   Your next appointment:   2 week(s)  Provider:   Julien Nordmann, MD or Eula Listen, PA-C

## 2023-01-06 NOTE — Progress Notes (Signed)
Cardiology Office Note    Date:  01/06/2023   ID:  [name], DOB 03-20-1941, MRN [id number]  PCP:  Eustaquio Boyden, MD  Cardiologist:  Julien Nordmann, MD  Electrophysiologist:  None   Chief Complaint: Follow-up  History of Present Illness:   [name] is a 82 y.o. male with history of CAD s/p remote 2-vessel CABG in 1990 with subsequent PCI/BMS to the LCx in 2002, HFrEF secondary to ICM, PAD, bladder cancer status post TURB in 11/2022, undergoing BCG, nonobstructive carotid artery disease, left retinal artery occlusion, peripheral neuropathy, COPD, DM2, HTN, HLD, spinal stenosis with chronic lower extremity pain and neuropathy, DVT of the left lower extremity, BPH, GERD, and OSA who presents for follow-up of HFrEF and abnormal Lexiscan MPI.  [name] underwent two-vessel CABG in 1990.  LHC in 2002 with successful PCI/BMS to the LCx.  LHC from 06/2015 showed 2 of 2 patent grafts with a patent LCx stent.  He is known to have small vessel and diffuse RCA disease which has been medically managed with long-acting nitrate.  In 08/2018, he underwent lower extremity arterial angiography, in the setting of ongoing lower extremity pain and abnormal duplex.  This showed diffuse and calcified bilateral SFA disease with three-vessel runoff below the knee bilaterally.  Medical therapy was recommended.  He was admitted to the hospital in 05/2019 with COVID.  Echo at that time demonstrated new LV systolic dysfunction with an EF of 35 to 40%.  Follow-up echo in early 2021 showed improvement in LV systolic function with an EF of 50 to 55%.  He had a low risk stress test in 05/2020 which was performed prior to back surgery.  In 03/2021 he complained of chest tightness and dyspnea.  Subsequent echo demonstrated an EF of 50 to 55% with normal RV systolic function and a PASP of 44.7 mmHg.  He subsequently underwent Lexiscan MPI in 04/2021, in the setting of admission for unstable angina and dyspnea,  which was low risk and without evidence of ischemia.  PFTs in 03/2021 showed mild to moderate obstructive lung disease.  Carotid artery ultrasound in 11/2022 showed less than 50% bilateral ICA stenosis.  He had been treated for COPD exacerbation/bronchitis with antibiotics.  During this time, he underwent echo on 11/22/2022, which demonstrated an EF of 40 to 45%, global hypokinesis, grade 1 diastolic dysfunction, normal RV systolic function and ventricular cavity size, mildly elevated PASP estimated at 39.8 mmHg, mildly dilated left atrium, mild mitral regurgitation, mild aortic stenosis with a mean gradient of 12 mmHg, and an estimated right atrial pressure of 3 mmHg.  Lexiscan MPI on 12/27/2022 was high risk with a moderate in size, severe, partially reversible basal and mid inferior/inferolateral defect consistent with ischemia and an element of scar.  LVEF 25 to 35%.  CT imaging showed post CABG findings with coronary artery calcification and aortic atherosclerosis.  A 1.4 cm hypodensity was noted in the liver similar to CT from 09/2021.  When compared to study from 2022, the inferior/inferolateral defect and reduced LVEF were new.  Given abnormal findings, it was recommended the patient increase Imdur to 30 mg twice daily and appointment to be scheduled for today.  He comes in today noting a 41-month history of exertional shortness of breath, fatigue, and chest discomfort.  He reports he is only able to work in his garden for approximately 6 to 10 minutes before needing to stop secondary to shortness of breath and fatigue "in my chest."  No  dizziness, presyncope, or syncope.  Reports increase in headaches following titration of Imdur.  Currently chest pain-free.   Labs independently reviewed: 09/2022 - TC 105, TG 73, HDL 41, LDL 49, potassium 4.6, BUN 25, serum creatinine 1.28, albumin 4.2, AST/ALT normal, A1c 6.1, Hgb 13.0, PLT 150 02/2021 - TSH normal  Past Medical History:  Diagnosis Date   (HFimpEF) heart  failure with improved ejection fraction (HCC)    a. 06/2019 Echo (in setting of COVID): EF 35-40%, mild LVH, g1 DD, glob HK; b. 09/2019 Echo: EF 50-55%, Gr1 DD; c. 03/2021 Echo: EF 50-55%, no rwma, mild LVH, GrI DD, nl RV fxn. RVSP 44.20mmHg. Mild-mod dil LA. Triv MR. Mild-mod Ao sclerosis.   Actinic keratosis    Anginal pain (HCC)    Aortic ectasia, abdominal (HCC)    a.) CT abd 01/01/2022: 2.9 cm infrarenal abdominal aortic ectasia   Bladder cancer (HCC) 2023   BPH (benign prostatic hyperplasia) 09/06/2007   CAD s/p CABG    a.) 1990 s/p MI--> CABG x 3; b.) LHC/PCI 2002 --> BMS (unk type) to LCX; c.) LHC 07/07/2015: 50% oLM-LM, 80% oRCA, 80% pRCA-1, 80% pRCA-2, 60% dLAD, 100% o-pLAD, 60% mLAD, 50% D2, LIMA-LAD, SVG-D2, SVG-RI --> med Rx; d.) MV 06/05/2020: fixed apical ant/apical defect. No sign ischemia; e.) MV 09/08/202: EF 52%, no ischemia.   Carotid arterial disease (HCC)    a.) 08/2016 Carotid U/S: <39% bilat; b.) carotid doppler 10/12/2022: mild-to-moderate (<50%) bilateral bifurcation plaque.   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   CKD (chronic kidney disease), stage III (HCC)    Community acquired pneumonia of right lower lobe of lung 06/05/2017   Diverticulosis    Dupuytren's contracture of right hand 10/29/2008   DVT (deep venous thrombosis) (HCC) 1998   Elevated prostate specific antigen (PSA) 07/09/2008   Emphysema of lung (HCC)    GERD 04/30/2007   Heart murmur    History of 2019 novel coronavirus disease (COVID-19) 05/20/2019   History of hiatal hernia    History of shingles    HLD (hyperlipidemia) 04/26/2007   HTN (hypertension) 04/30/2007   Laceration of skin of left hand 03/08/2018   LBBB (left bundle branch block)    Lumbar disc disease with radiculopathy    Migraines    Myocardial infarction (HCC) 1990   a.) resulted in 3v CABG   Myocardial infarction (HCC) 2002   a.) second cardiac event; PCI with placement of BMS (unknown type)  to LCx   Neuropathy of both feet    OSA on CPAP    Osteoarthritis    PAD (peripheral artery disease) (HCC)    a. 07/2017 LE duplex: RSFA 75-52m, LSFA 75-81m, 50-74d; c. 08/2018 Periph Angio: No signif AoIliac dzs. Mod-sev Ca2+ RSFA w/ diff dzs throughout- 3 vessel runoff. Borderline signif LSFA dzs w/ mod-sev Ca2+ vessels and 3 vessel runoff below the knee-->Med rx.   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   Right mid dorsum forearm - in situ - [facility]    Past Surgical History:  Procedure Laterality Date   ABDOMINAL AORTOGRAM W/LOWER EXTREMITY [name] 09/12/2018   Procedure: ABDOMINAL AORTOGRAM W/LOWER EXTREMITY;  Surgeon: Lorine Bears  A, MD;  Location: [facility];  Service: Cardiovascular;  Laterality: [name];   ANTERIOR CERVICAL DECOMP/DISCECTOMY FUSION [name] 07/11/2022   Anterior Cervical Decompression Fusion ,Interboy Prothesis,Plate/Screws , Cervical four-five,Cervical five-six [name], Tinnie Gens, MD)   APPENDECTOMY     rupture   BACK SURGERY     1984 and then another one [facility]   BLADDER INSTILLATION [name] 10/19/2021   Procedure: BLADDER INSTILLATION OF GEMCITABINE;  Surgeon: Riki Altes, MD;  Location: [facility];  Service: Urology;  Laterality: [name];   BLADDER INSTILLATION [name] 11/15/2022   Procedure: BLADDER INSTILLATION OF GEMCITABINE;  Surgeon: Riki Altes, MD;  Location: [facility];  Service: Urology;  Laterality: [name];   CARDIAC CATHETERIZATION [name] 07/07/2015   Procedure: Left Heart Cath and Cors/Grafts Angiography;  Surgeon: Antonieta Iba, MD;  Location: [facility];  Service: Cardiovascular;  Laterality: [name];   CATARACT EXTRACTION, BILATERAL  2009   CERVICAL SPINE SURGERY  12/2016   cervical stenosis  [name])   COLONOSCOPY  03/2010   HP polyp, diverticulosis, rpt 10 yrs ([name])   CORONARY ANGIOPLASTY  11/30/2000   LCX   CORONARY ARTERY BYPASS GRAFT  1990   3 vessel    CYSTOSCOPY  12/23/2010   [facility]   JOINT REPLACEMENT     KNEE ARTHROSCOPY Right    x2   LAMINOTOMY  1986   L5/S1 lumbar laminotomy for two ruptured discs/fusion   LUMBAR LAMINECTOMY/DECOMPRESSION MICRODISCECTOMY [name] 07/13/2016   Procedure: LUMBAR TWO-THREE, LUMBAR THREE-FOUR, LUMBAR FOUR-FIVE LAMINECTOMY AND FORAMINOTOMY;  Surgeon: Tressie Stalker, MD;  Location: [facility];  Service: Neurosurgery;  Laterality: [name];  LAMINECTOMY AND FORAMINOTOMY L2-L3, L3-L4,L4-L5   LUMBAR SPINE SURGERY  06/2020   Bilateral redo laminectomy/laminotomy/foraminotomies/medial facetectomy to decompress the bilateral L4 and L5 nerve roots [name])   TOTAL HIP ARTHROPLASTY Bilateral [id number]   TOTAL KNEE ARTHROPLASTY Right 03/06/2017   Procedure: RIGHT TOTAL KNEE ARTHROPLASTY;  Surgeon: Ollen Gross, MD;  Location: [facility];  Service: Orthopedics;  Laterality: Right;   TRANSURETHRAL RESECTION OF BLADDER TUMOR [name] 10/19/2021   Procedure: TRANSURETHRAL RESECTION OF BLADDER TUMOR (TURBT);  Surgeon: Riki Altes, MD;  Location: [facility];  Service: Urology;  Laterality: [name];   TRANSURETHRAL RESECTION OF BLADDER TUMOR [name] 11/15/2022   Procedure: TRANSURETHRAL RESECTION OF BLADDER TUMOR (TURBT);  Surgeon: Riki Altes, MD;  Location: [facility];  Service: Urology;  Laterality: [name];    Current Medications: Current Meds  Medication Sig   albuterol (PROVENTIL) (2.5 MG/3ML) 0.083% nebulizer solution Take 3 mLs (2.5 mg total) by nebulization every 6 (six) hours as needed for wheezing or shortness of breath.   albuterol (VENTOLIN HFA) 108 (90 Base) MCG/ACT inhaler TAKE 2 PUFFS INTO LUNGS EVERY 6 HOURS ASNEEDED FOR WHEEZING OR SHORTNESS OF BREATH   aspirin EC 81 MG EC tablet Take 1 tablet (81 mg total) by mouth daily.   atorvastatin (LIPITOR) 20 MG  tablet TAKE 1 TABLET BY MOUTH DAILY.   Azelastine HCl (ASTEPRO) 0.15 % SOLN Place 1 spray into the nose at bedtime as needed (Sinus congestion, allergies).   carvedilol (COREG) 3.125 MG tablet Take 1 tablet (3.125 mg total) by mouth 2 (two) times daily.   cyclobenzaprine (FLEXERIL) 5 MG tablet Take 1 tablet (5 mg total) by mouth 3 (three) times daily as needed for muscle spasms.   diphenhydrAMINE (BENADRYL) 50 MG capsule Take one capsule 1 hour prior to scan.   docusate sodium (COLACE) 100 MG capsule Take 1 capsule (100 mg total) by  mouth 2 (two) times daily.   ezetimibe (ZETIA) 10 MG tablet Take 1 tablet (10 mg total) by mouth daily.   famotidine (PEPCID) 20 MG tablet Take 20 mg by mouth daily.   fluticasone (FLONASE) 50 MCG/ACT nasal spray Place 1 spray into both nostrils daily as needed for allergies or rhinitis.   Fluticasone-Umeclidin-Vilant (TRELEGY ELLIPTA) 200-62.5-25 MCG/ACT AEPB Inhale 1 puff into the lungs daily.   furosemide (LASIX) 20 MG tablet Take 1 tablet by mouth every third day as directed.   gabapentin (NEURONTIN) 600 MG tablet TAKE ONE TABLET BY MOUTH 3 TIMES DAILY   losartan (COZAAR) 25 MG tablet TAKE 1 TABLET BY MOUTH DAILY   mupirocin ointment (BACTROBAN) 2 % Apply 1 Application topically 2 (two) times daily.   nitroGLYCERIN (NITROSTAT) 0.4 MG SL tablet Place 1 tablet (0.4 mg total) under the tongue every 5 (five) minutes as needed for chest pain.   polyethylene glycol (MIRALAX / GLYCOLAX) 17 g packet Take 17 g by mouth daily as needed for mild constipation.    predniSONE (DELTASONE) 50 MG tablet Take one tablet 13 hours, 7 hours, and 1 hour prior to scan.   ranolazine (RANEXA) 500 MG 12 hr tablet TAKE ONE (1) TABLET BY MOUTH TWO TIMES PER DAY   silodosin (RAPAFLO) 8 MG CAPS capsule TAKE ONE CAPSULE BY MOUTH DAILY WITH BREAKFAST.   Vibegron (GEMTESA) 75 MG TABS Take 1 tablet (75 mg total) by mouth daily.   [DISCONTINUED] isosorbide mononitrate (IMDUR) 30 MG 24 hr tablet  Take 1 tablet (30 mg total) by mouth 2 (two) times daily.    Allergies:   Vioxx [rofecoxib], Spiriva respimat [tiotropium bromide monohydrate], Contrast media [iodinated contrast media], and Morphine   Social History   Socioeconomic History   Marital status: Married    Spouse name: Harriett Sine   Number of children: 1   Years of education: Not on file   Highest education level: 12th grade  Occupational History   Not on file  Tobacco Use   Smoking status: Former    Packs/day: 1.00    Years: 25.00    Additional pack years: 0.00    Total pack years: 25.00    Types: Cigarettes    Quit date: 08/15/1988    Years since quitting: 34.4    Passive exposure: Past   Smokeless tobacco: Never  Vaping Use   Vaping Use: Never used  Substance and Sexual Activity   Alcohol use: No    Alcohol/week: 0.0 standard drinks of alcohol   Drug use: No   Sexual activity: Yes  Other Topics Concern   Not on file  Social History Narrative   Married, wife Harriett Sine, for 25 years in 2016. 1 daughter and 3 grandkids from first marriage.    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    Diet: good water, fruits/vegetables daily      OSA eval recommended while hospitalized with Covid 05/2019   Social Determinants of Health   Financial Resource Strain: Low Risk  (01/02/2023)   Overall Financial Resource Strain (CARDIA)    Difficulty of Paying Living Expenses: Not hard at all  Food Insecurity: No Food Insecurity (01/02/2023)   Hunger Vital Sign    Worried About Running Out of Food in the Last Year: Never true    Ran Out of Food in the Last Year: Never true  Transportation Needs: No Transportation Needs (01/02/2023)   PRAPARE - Administrator, Civil Service (Medical): No    Lack of Transportation (Non-Medical): No  Physical Activity: Unknown (01/02/2023)   Exercise Vital  Sign    Days of Exercise per Week: Patient declined    Minutes of Exercise per Session: 30 min  Stress: No Stress Concern Present (01/02/2023)   [employer] of Occupational Health - Occupational Stress Questionnaire    Feeling of Stress : Not at all  Social Connections: Socially Integrated (01/02/2023)   Social Connection and Isolation Panel [NHANES]    Frequency of Communication with Friends and Family: More than three times a week    Frequency of Social Gatherings with Friends and Family: More than three times a week    Attends Religious Services: 1 to 4 times per year    Active Member of [employer] or Organizations: Yes    Attends Engineer, structural: More than 4 times per year    Marital Status: Married     Family History:  The patient's family history includes Cancer in his sister and another family member; Diabetes in his brother and mother; Heart attack in his mother; Heart disease in his father; Polycystic kidney disease in his father and sister; Stroke in his father and mother.  ROS:   12-point review of systems is negative unless otherwise noted in the HPI.   EKGs/Labs/Other Studies Reviewed:    Studies reviewed were summarized above. The additional studies were reviewed today:  Lexiscan MPI 12/27/2022:   Abnormal pharmacologic myocardial perfusion stress test.   There is a moderate in size, severe, partially reversible basal and mid inferior/inferolateral defect consistent with ischemia and an element of scar.   Left ventricular systolic function is severely reduced (LVEF 25-35%).   Post-CABG findings, coronary artery calcification, and aortic atherosclerosis are noted on the attenuation correction CT.   1.4 cm hypodensity is noted in the liver, similar to the prior abdominal CT from 10/04/2021.   Compared with the prior study from 04/22/2021, inferior/inferolateral defect and reduced LVEF are new.   This is a high-risk study. __________  2D echo 11/22/2022: 1.  Left ventricular ejection fraction, by estimation, is 40 to 45%. Left  ventricular ejection fraction by 3D volume is 42 %. The left ventricle has  mildly decreased function. The left ventricle demonstrates global  hypokinesis. Left ventricular diastolic   parameters are consistent with Grade I diastolic dysfunction (impaired  relaxation). The average left ventricular global longitudinal strain is  -10.2 %.   2. Right ventricular systolic function is normal. The right ventricular  size is normal. There is mildly elevated pulmonary artery systolic  pressure. The estimated right ventricular systolic pressure is 39.8 mmHg.   3. Left atrial size was mildly dilated.   4. The mitral valve is normal in structure. Mild mitral valve  regurgitation. No evidence of mitral stenosis.   5. The aortic valve is normal in structure. Aortic valve regurgitation is  not visualized. Mild aortic valve stenosis. Aortic valve mean gradient  measures 12.0 mmHg.   6. The inferior vena cava is normal in size with greater than 50%  respiratory variability, suggesting right atrial pressure of 3 mmHg.   Comparison(s): 03/23/21 50-55%, Mild LVH, RVSP 45.  __________  Carotid artery ultrasound 10/12/2022: IMPRESSION: Mild-to-moderate bilateral carotid bifurcation plaque with Doppler measurements indicative of less than 50% stenosis. __________  Eugenie Birks MPI 04/2021: Pharmacological myocardial perfusion imaging study with no significant  ischemia Normal wall  motion, EF estimated at 52% No EKG changes concerning for ischemia at peak stress or in recovery. Low risk scan __________   2D echo 03/2021: 1. Left ventricular ejection fraction, by estimation, is 50 to 55%. The  left ventricle has low normal function. The left ventricle has no regional  wall motion abnormalities. There is mild left ventricular hypertrophy.  Left ventricular diastolic  parameters are consistent with Grade I diastolic dysfunction (impaired   relaxation).   2. Right ventricular systolic function is normal. The right ventricular  size is normal. There is mildly elevated pulmonary artery systolic  pressure. The estimated right ventricular systolic pressure is 44.7 mmHg.   3. Left atrial size was mild to moderately dilated.   4. The mitral valve is normal in structure. Trivial mitral valve  regurgitation. No evidence of mitral stenosis.   5. The aortic valve is normal in structure. Aortic valve regurgitation is  not visualized. Mild to moderate aortic valve sclerosis/calcification is  present, without any evidence of aortic stenosis. __________  See Epic for more remote studies   EKG:  EKG is not ordered today.    Recent Labs: 10/07/2022: ALT 14 01/06/2023: BUN 29; Creatinine, Ser 1.32; Hemoglobin 13.0; Platelets 182; Potassium 4.2; Sodium 138  Recent Lipid Panel    Component Value Date/Time   CHOL 105 10/07/2022 1150   TRIG 73.0 10/07/2022 1150   HDL 41.30 10/07/2022 1150   CHOLHDL 3 10/07/2022 1150   VLDL 14.6 10/07/2022 1150   LDLCALC 49 10/07/2022 1150   LDLDIRECT 106.0 01/01/2016 0844    PHYSICAL EXAM:    VS:  BP 136/60 (BP Location: Left Arm, Patient Position: Sitting, Cuff Size: Normal)   Pulse 72   Ht 6' (1.829 m)   Wt 212 lb (96.2 kg)   SpO2 94%   BMI 28.75 kg/m   BMI: Body mass index is 28.75 kg/m.  Physical Exam Vitals reviewed.  Constitutional:      Appearance: He is well-developed.  HENT:     Head: Normocephalic and atraumatic.  Eyes:     General:        Right eye: No discharge.        Left eye: No discharge.  Neck:     Vascular: No JVD.  Cardiovascular:     Rate and Rhythm: Normal rate and regular rhythm.     Heart sounds: S1 normal and S2 normal. Heart sounds not distant. No midsystolic click and no opening snap. Murmur heard.     Systolic murmur is present with a grade of 1/6 at the upper right sternal border.     No friction rub.  Pulmonary:     Effort: Pulmonary effort is normal.  No respiratory distress.     Breath sounds: Normal breath sounds. No decreased breath sounds, wheezing or rales.  Chest:     Chest wall: No tenderness.  Abdominal:     General: There is no distension.  Musculoskeletal:     Cervical back: Normal range of motion.     Comments: Trivial bilateral pretibial edema.  Reports this is at baseline.  Skin:    General: Skin is warm and dry.     Nails: There is no clubbing.  Neurological:     Mental Status: He is alert and oriented to person, place, and time.  Psychiatric:        Speech: Speech normal.        Behavior: Behavior normal.        Thought Content: Thought  content normal.        Judgment: Judgment normal.     Wt Readings from Last 3 Encounters:  01/06/23 212 lb (96.2 kg)  01/04/23 206 lb (93.4 kg)  01/03/23 211 lb (95.7 kg)     ASSESSMENT & PLAN:   CAD status post CABG status post PCI with unstable angina and abnormal Lexiscan MPI: Reports a 32-month history of increasing exertional dyspnea, chest discomfort, and fatigue.  Recently found to have recurrence of cardiomyopathy on echo with subsequent Lexiscan MPI demonstrating a moderate-sized, severe, partially reversible basal and mid inferior/inferolateral defect consistent with ischemia and an element of scar which was new when compared to study from 2022.  Overall, this was a high risk study.  He has not tolerated titration of Imdur secondary to headache.  Given abnormal Lexiscan MPI and in the setting of ongoing exertional symptoms we will proceed with diagnostic R/LHC.  Patient will be premedicated for contrast media allergy.  Reduce Imdur to 30 mg daily.  Continue aspirin 81 mg daily, atorvastatin, ezetimibe, carvedilol, and ranolazine.  HFrEF secondary to ICM: Euvolemic and well compensated.  Continue carvedilol, losartan, and current dose of furosemide.  Based on cath results, would look to further escalate GDMT with possible transitioning from ARB to ARNI.  Would defer addition  of SGLT2 inhibitor at this time given bladder cancer treatment at this time.  Look to repeat echo in several months time following ischemic evaluation and optimization of GDMT to reevaluate LV systolic function.  HTN: Blood pressure is reasonably controlled in the office today.  Continue medical therapy as outlined above.  HLD: LDL 49 in 09/2022.  Remains on atorvastatin sent in by.  PAD: Stable, no lifestyle limiting claudication.  Remains on aspirin, statin, and ezetimibe.  Followed by Dr. Kirke Corin.  Carotid artery disease: Less than 50% bilateral ICA stenosis on imaging in 11/2022. Left retinal artery occlusion: Patient improved.  Followed by ophthalmology.  Echo without evidence of cardiac emboli.  Carotid artery ultrasound without obstructive disease.  Plan for Zio patch following cardiac cath.   Shared Decision Making/Informed Consent{  The risks [stroke (1 in 1000), death (1 in 1000), kidney failure [usually temporary] (1 in 500), bleeding (1 in 200), allergic reaction [possibly serious] (1 in 200)], benefits (diagnostic support and management of coronary artery disease) and alternatives of a cardiac catheterization were discussed in detail with Mr. Fabris and he is willing to proceed.     Disposition: F/u with Dr. Mariah Milling or an APP 1-2 weeks post [name].   Medication Adjustments/Labs and Tests Ordered: Current medicines are reviewed at length with the patient today.  Concerns regarding medicines are outlined above. Medication changes, Labs and Tests ordered today are summarized above and listed in the Patient Instructions accessible in Encounters.   Signed, Eula Listen, PA-C 01/06/2023 9:56 AM      [facility] [address] [place], [place] [address] [phone]

## 2023-01-07 LAB — WOUND CULTURE: SPECIMEN QUALITY:: ADEQUATE

## 2023-01-10 ENCOUNTER — Other Ambulatory Visit: Payer: Self-pay | Admitting: Family Medicine

## 2023-01-10 ENCOUNTER — Ambulatory Visit (INDEPENDENT_AMBULATORY_CARE_PROVIDER_SITE_OTHER): Payer: HMO | Admitting: Family Medicine

## 2023-01-10 ENCOUNTER — Encounter: Payer: Self-pay | Admitting: Family Medicine

## 2023-01-10 VITALS — BP 126/64 | HR 67 | Temp 97.6°F | Ht 72.0 in | Wt 211.2 lb

## 2023-01-10 DIAGNOSIS — S81802D Unspecified open wound, left lower leg, subsequent encounter: Secondary | ICD-10-CM

## 2023-01-10 MED ORDER — MUPIROCIN 2 % EX OINT: 1.0000 | TOPICAL_OINTMENT | Freq: Two times a day (BID) | CUTANEOUS | 0 refills | Status: AC

## 2023-01-10 NOTE — Assessment & Plan Note (Addendum)
Wound continues healing slowly but well.  Recent wound culture returned no growth.  Continue mupirocin topically.  RTC 10-14 days for anticipate final wound check.

## 2023-01-10 NOTE — Progress Notes (Unsigned)
BCG Bladder Instillation  BCG # ***  Due to Bladder Cancer patient is present today for a BCG treatment. Patient was cleaned and prepped in a sterile fashion with betadine. A ***FR catheter was inserted, urine return was noted ***ml, urine was *** in color.  50ml of reconstituted BCG was instilled into the bladder. The catheter was then removed. Patient tolerated well, {dnt complications:20057}  Performed by: ***  Follow up/ Additional notes: ***   

## 2023-01-10 NOTE — Patient Instructions (Addendum)
Wound continues healing slowly Continue mupirocin cream and daily dressing changes  Return either June 7th or 10th for wound check

## 2023-01-10 NOTE — Progress Notes (Signed)
Ph: [phone] Fax: [phone]   Patient ID: [name], male    DOB: 03-04-1941, 82 y.o.   MRN: [id number]  This visit was conducted in person.  BP 126/64   Pulse 67   Temp 97.6 F (36.4 C) (Temporal)   Ht 6' (1.829 m)   Wt 211 lb 4 oz (95.8 kg)   SpO2 94%   BMI 28.65 kg/m    CC: 1 wk wound check Subjective:   HPI: [name] is a 82 y.o. male presenting on 01/10/2023 for Wound Check (Here for 1 wk wound chk. )   See prior note for details.  Recent LLE cellulitis after abrasion to anterior L leg after fall in yard - initially treated with keflex + doxy x7 days + topical mupirocin.   Recent wound culture 01/03/2023: no growth final.   Upcoming planned admission 01/13/2023 for right and left heart catheterization for further evaluation of angina.      Relevant past medical, surgical, family and social history reviewed and updated as indicated. Interim medical history since our last visit reviewed. Allergies and medications reviewed and updated. Outpatient Medications Prior to Visit  Medication Sig Dispense Refill   albuterol (PROVENTIL) (2.5 MG/3ML) 0.083% nebulizer solution Take 3 mLs (2.5 mg total) by nebulization every 6 (six) hours as needed for wheezing or shortness of breath. 75 mL 12   albuterol (VENTOLIN HFA) 108 (90 Base) MCG/ACT inhaler TAKE 2 PUFFS INTO LUNGS EVERY 6 HOURS ASNEEDED FOR WHEEZING OR SHORTNESS OF BREATH 8.5 g 6   aspirin EC 81 MG EC tablet Take 1 tablet (81 mg total) by mouth daily. 30 tablet 0   atorvastatin (LIPITOR) 20 MG tablet TAKE 1 TABLET BY MOUTH DAILY. 90 tablet 3   Azelastine HCl (ASTEPRO) 0.15 % SOLN Place 1 spray into the nose at bedtime as needed (Sinus congestion, allergies). 30 mL 11   carvedilol (COREG) 3.125 MG tablet Take 1 tablet (3.125 mg total) by mouth 2 (two) times daily. 180 tablet 3   cyclobenzaprine (FLEXERIL) 5 MG tablet Take 1 tablet (5 mg total) by mouth 3 (three) times daily as needed for muscle spasms.  30 tablet 0   diphenhydrAMINE (BENADRYL) 50 MG capsule Take one capsule 1 hour prior to scan. 1 capsule 0   docusate sodium (COLACE) 100 MG capsule Take 1 capsule (100 mg total) by mouth 2 (two) times daily. 30 capsule 0   ezetimibe (ZETIA) 10 MG tablet Take 1 tablet (10 mg total) by mouth daily. 90 tablet 3   famotidine (PEPCID) 20 MG tablet Take 20 mg by mouth daily.     fluticasone (FLONASE) 50 MCG/ACT nasal spray Place 1 spray into both nostrils daily as needed for allergies or rhinitis.     Fluticasone-Umeclidin-Vilant (TRELEGY ELLIPTA) 200-62.5-25 MCG/ACT AEPB Inhale 1 puff into the lungs daily. 180 each 0   Fluticasone-Umeclidin-Vilant (TRELEGY ELLIPTA) 200-62.5-25 MCG/ACT AEPB Inhale 1 puff into the lungs daily. 14 each 0   furosemide (LASIX) 20 MG tablet Take 1 tablet by mouth every third day as directed. 30 tablet 2   gabapentin (NEURONTIN) 600 MG tablet TAKE ONE TABLET BY MOUTH 3 TIMES DAILY 270 tablet 1   guaiFENesin-codeine (ROBITUSSIN AC) 100-10 MG/5ML syrup Take 5 mLs by mouth 3 (three) times daily as needed for cough. 120 mL 0   HYDROcodone-acetaminophen (NORCO/VICODIN) 5-325 MG tablet Take 1-2 tablets by mouth every 4 (four) hours as needed for moderate pain. 30 tablet 0   isosorbide mononitrate (IMDUR)  30 MG 24 hr tablet Take 1 tablet (30 mg total) by mouth daily. 90 tablet 3   losartan (COZAAR) 25 MG tablet TAKE 1 TABLET BY MOUTH DAILY 90 tablet 0   nitroGLYCERIN (NITROSTAT) 0.4 MG SL tablet Place 1 tablet (0.4 mg total) under the tongue every 5 (five) minutes as needed for chest pain. 25 tablet 3   polyethylene glycol (MIRALAX / GLYCOLAX) 17 g packet Take 17 g by mouth daily as needed for mild constipation.      predniSONE (DELTASONE) 50 MG tablet Take one tablet 13 hours, 7 hours, and 1 hour prior to scan. 3 tablet 0   ranolazine (RANEXA) 500 MG 12 hr tablet TAKE ONE (1) TABLET BY MOUTH TWO TIMES PER DAY 180 tablet 3   silodosin (RAPAFLO) 8 MG CAPS capsule TAKE ONE CAPSULE BY  MOUTH DAILY WITH BREAKFAST. 30 capsule 0   Vibegron (GEMTESA) 75 MG TABS Take 1 tablet (75 mg total) by mouth daily. 42 tablet 0   mupirocin ointment (BACTROBAN) 2 % Apply 1 Application topically 2 (two) times daily. 22 g 0   No facility-administered medications prior to visit.     Per HPI unless specifically indicated in ROS section below Review of Systems  Objective:  BP 126/64   Pulse 67   Temp 97.6 F (36.4 C) (Temporal)   Ht 6' (1.829 m)   Wt 211 lb 4 oz (95.8 kg)   SpO2 94%   BMI 28.65 kg/m   Wt Readings from Last 3 Encounters:  01/10/23 211 lb 4 oz (95.8 kg)  01/06/23 212 lb (96.2 kg)  01/04/23 206 lb (93.4 kg)      Physical Exam Vitals and nursing note reviewed.  Constitutional:      Appearance: Normal appearance. He is not ill-appearing.  Skin:    General: Skin is warm and dry.     Findings: Wound present. No rash.          Comments: 4 x 0.5cm open sore to anterior leg distal to knee with decreasing granulation tissue, healing via second intention. No erythema, drainage.   Neurological:     Mental Status: He is alert.  Psychiatric:        Mood and Affect: Mood normal.        Behavior: Behavior normal.       Wound care: Wound irrigated with normal saline, then mupirocin ointment (pt brought from home) applied followed by non stick gauze, kerlex and ace wrap.   LLE:   Results for orders placed or performed during the hospital encounter of 01/06/23  Basic metabolic panel  Result Value Ref Range   Sodium 138 135 - 145 mmol/L   Potassium 4.2 3.5 - 5.1 mmol/L   Chloride 107 98 - 111 mmol/L   CO2 22 22 - 32 mmol/L   Glucose, Bld 112 (H) 70 - 99 mg/dL   BUN 29 (H) 8 - 23 mg/dL   Creatinine, Ser 4.09 (H) 0.61 - 1.24 mg/dL   Calcium 8.6 (L) 8.9 - 10.3 mg/dL   GFR, Estimated 54 (L) >60 mL/min   Anion gap 9 5 - 15  CBC  Result Value Ref Range   WBC 6.5 4.0 - 10.5 K/uL   RBC 3.96 (L) 4.22 - 5.81 MIL/uL   Hemoglobin 13.0 13.0 - 17.0 g/dL   HCT 81.1 (L)  91.4 - 52.0 %   MCV 96.7 80.0 - 100.0 fL   MCH 32.8 26.0 - 34.0 pg   MCHC 33.9 30.0 -  36.0 g/dL   RDW 16.1 09.6 - 04.5 %   Platelets 182 150 - 400 K/uL   nRBC 0.0 0.0 - 0.2 %   *Note: Due to a large number of results and/or encounters for the requested time period, some results have not been displayed. A complete set of results can be found in Results Review.    Assessment & Plan:   Problem List Items Addressed This Visit     Traumatic open wound of lower leg with delayed healing, left - Primary    Wound continues healing slowly but well.  Recent wound culture returned no growth.  Continue mupirocin topically.  RTC 10-14 days for anticipate final wound check.         Meds ordered this encounter  Medications   mupirocin ointment (BACTROBAN) 2 %    Sig: Apply 1 Application topically 2 (two) times daily.    Dispense:  22 g    Refill:  0    No orders of the defined types were placed in this encounter.   Patient Instructions  Wound continues healing slowly Continue mupirocin cream and daily dressing changes  Return either June 7th or 10th for wound check  Follow up plan: Return in about 10 days (around 01/20/2023), or if symptoms worsen or fail to improve, for follow up visit.  Eustaquio Boyden, MD

## 2023-01-11 ENCOUNTER — Ambulatory Visit (INDEPENDENT_AMBULATORY_CARE_PROVIDER_SITE_OTHER): Payer: HMO | Admitting: Urology

## 2023-01-11 DIAGNOSIS — C679 Malignant neoplasm of bladder, unspecified: Secondary | ICD-10-CM

## 2023-01-11 DIAGNOSIS — D494 Neoplasm of unspecified behavior of bladder: Secondary | ICD-10-CM

## 2023-01-11 LAB — MICROSCOPIC EXAMINATION

## 2023-01-11 LAB — URINALYSIS, COMPLETE
Bilirubin, UA: NEGATIVE
Glucose, UA: NEGATIVE
Ketones, UA: NEGATIVE
Leukocytes,UA: NEGATIVE
Nitrite, UA: NEGATIVE
Protein,UA: NEGATIVE
RBC, UA: NEGATIVE
Specific Gravity, UA: 1.015 (ref 1.005–1.030)
Urobilinogen, Ur: 1 mg/dL (ref 0.2–1.0)
pH, UA: 5.5 (ref 5.0–7.5)

## 2023-01-11 MED ORDER — BCG LIVE 50 MG IS SUSR
3.2400 mL | Freq: Once | INTRAVESICAL | Status: AC
Start: 2023-01-11 — End: 2023-01-11
  Administered 2023-01-11: 81 mg via INTRAVESICAL

## 2023-01-11 NOTE — Telephone Encounter (Signed)
Rx already sent at 01/10/23 OV, #22g/0 to [facility].   Request denied.

## 2023-01-12 ENCOUNTER — Other Ambulatory Visit: Payer: Self-pay | Admitting: Cardiovascular Disease

## 2023-01-13 ENCOUNTER — Other Ambulatory Visit: Payer: Self-pay

## 2023-01-13 ENCOUNTER — Encounter: Payer: Self-pay | Admitting: Cardiovascular Disease

## 2023-01-13 ENCOUNTER — Encounter
Admission: RE | Disposition: A | Discharge status: Home / Self Care | Payer: Self-pay | Source: Home / Self Care | Attending: Cardiovascular Disease

## 2023-01-13 ENCOUNTER — Observation Stay
Admission: RE | Admit: 2023-01-13 | Discharge: 2023-01-14 | Disposition: A | Discharge status: Home / Self Care | Payer: HMO | Attending: Cardiovascular Disease | Admitting: Cardiovascular Disease

## 2023-01-13 DIAGNOSIS — Z87891 Personal history of nicotine dependence: Secondary | ICD-10-CM | POA: Insufficient documentation

## 2023-01-13 DIAGNOSIS — R9439 Abnormal result of other cardiovascular function study: Secondary | ICD-10-CM | POA: Insufficient documentation

## 2023-01-13 DIAGNOSIS — I25110 Atherosclerotic heart disease of native coronary artery with unstable angina pectoris: Secondary | ICD-10-CM | POA: Diagnosis present

## 2023-01-13 DIAGNOSIS — I5023 Acute on chronic systolic (congestive) heart failure: Secondary | ICD-10-CM

## 2023-01-13 DIAGNOSIS — I25720 Atherosclerosis of autologous artery coronary artery bypass graft(s) with unstable angina pectoris: Secondary | ICD-10-CM | POA: Diagnosis not present

## 2023-01-13 DIAGNOSIS — I209 Angina pectoris, unspecified: Secondary | ICD-10-CM | POA: Diagnosis present

## 2023-01-13 DIAGNOSIS — I272 Pulmonary hypertension, unspecified: Secondary | ICD-10-CM

## 2023-01-13 DIAGNOSIS — I130 Hypertensive heart and chronic kidney disease with heart failure and stage 1 through stage 4 chronic kidney disease, or unspecified chronic kidney disease: Secondary | ICD-10-CM | POA: Insufficient documentation

## 2023-01-13 DIAGNOSIS — Z8551 Personal history of malignant neoplasm of bladder: Secondary | ICD-10-CM | POA: Diagnosis not present

## 2023-01-13 DIAGNOSIS — Z96643 Presence of artificial hip joint, bilateral: Secondary | ICD-10-CM | POA: Diagnosis not present

## 2023-01-13 DIAGNOSIS — Z86718 Personal history of other venous thrombosis and embolism: Secondary | ICD-10-CM | POA: Insufficient documentation

## 2023-01-13 DIAGNOSIS — Z7982 Long term (current) use of aspirin: Secondary | ICD-10-CM | POA: Diagnosis not present

## 2023-01-13 DIAGNOSIS — Z96651 Presence of right artificial knee joint: Secondary | ICD-10-CM | POA: Diagnosis not present

## 2023-01-13 DIAGNOSIS — Z951 Presence of aortocoronary bypass graft: Secondary | ICD-10-CM | POA: Diagnosis not present

## 2023-01-13 DIAGNOSIS — Z79899 Other long term (current) drug therapy: Secondary | ICD-10-CM | POA: Diagnosis not present

## 2023-01-13 DIAGNOSIS — Z85828 Personal history of other malignant neoplasm of skin: Secondary | ICD-10-CM | POA: Insufficient documentation

## 2023-01-13 DIAGNOSIS — N183 Chronic kidney disease, stage 3 unspecified: Secondary | ICD-10-CM | POA: Diagnosis not present

## 2023-01-13 DIAGNOSIS — E1122 Type 2 diabetes mellitus with diabetic chronic kidney disease: Secondary | ICD-10-CM | POA: Diagnosis not present

## 2023-01-13 DIAGNOSIS — Z8616 Personal history of COVID-19: Secondary | ICD-10-CM | POA: Diagnosis not present

## 2023-01-13 DIAGNOSIS — J449 Chronic obstructive pulmonary disease, unspecified: Secondary | ICD-10-CM | POA: Diagnosis not present

## 2023-01-13 DIAGNOSIS — E1169 Type 2 diabetes mellitus with other specified complication: Secondary | ICD-10-CM | POA: Diagnosis present

## 2023-01-13 DIAGNOSIS — I200 Unstable angina: Secondary | ICD-10-CM | POA: Diagnosis not present

## 2023-01-13 DIAGNOSIS — Z955 Presence of coronary angioplasty implant and graft: Secondary | ICD-10-CM | POA: Diagnosis not present

## 2023-01-13 DIAGNOSIS — I10 Essential (primary) hypertension: Secondary | ICD-10-CM | POA: Diagnosis present

## 2023-01-13 HISTORY — PX: CORONARY STENT INTERVENTION: CATH118234

## 2023-01-13 HISTORY — PX: RIGHT/LEFT HEART CATH AND CORONARY ANGIOGRAPHY: CATH118266

## 2023-01-13 SURGERY — RIGHT/LEFT HEART CATH AND CORONARY ANGIOGRAPHY
Anesthesia: Moderate Sedation

## 2023-01-13 MED ORDER — HYDROCODONE-ACETAMINOPHEN 5-325 MG PO TABS: 1.0000 | ORAL_TABLET | ORAL | Status: DC | PRN

## 2023-01-13 MED ORDER — HEPARIN (PORCINE) IN NACL 2000-0.9 UNIT/L-% IV SOLN
INTRAVENOUS | Status: DC | PRN
  Administered 2023-01-13: 1000 mL

## 2023-01-13 MED ORDER — ISOSORBIDE MONONITRATE ER 30 MG PO TB24
30.0000 mg | ORAL_TABLET | Freq: Every day | ORAL | Status: DC
  Administered 2023-01-13 – 2023-01-14 (×2): 30 mg via ORAL
  Filled 2023-01-13 (×2): qty 1

## 2023-01-13 MED ORDER — HEPARIN (PORCINE) IN NACL 1000-0.9 UT/500ML-% IV SOLN
INTRAVENOUS | Status: AC
  Filled 2023-01-13: qty 1000

## 2023-01-13 MED ORDER — IOHEXOL 300 MG/ML  SOLN
INTRAMUSCULAR | Status: DC | PRN
  Administered 2023-01-13: 215 mL

## 2023-01-13 MED ORDER — FLUTICASONE PROPIONATE 50 MCG/ACT NA SUSP: 1.0000 | Freq: Every day | NASAL | Status: DC | PRN

## 2023-01-13 MED ORDER — VERAPAMIL HCL 2.5 MG/ML IV SOLN
INTRAVENOUS | Status: DC | PRN
  Administered 2023-01-13: 2.5 mg via INTRAVENOUS

## 2023-01-13 MED ORDER — ALBUTEROL SULFATE (2.5 MG/3ML) 0.083% IN NEBU: 2.5000 mg | INHALATION_SOLUTION | Freq: Four times a day (QID) | RESPIRATORY_TRACT | Status: DC | PRN

## 2023-01-13 MED ORDER — CLOPIDOGREL BISULFATE 75 MG PO TABS
75.0000 mg | ORAL_TABLET | Freq: Every day | ORAL | Status: DC
  Administered 2023-01-14: 75 mg via ORAL
  Filled 2023-01-13: qty 1

## 2023-01-13 MED ORDER — FUROSEMIDE 40 MG PO TABS
40.0000 mg | ORAL_TABLET | Freq: Two times a day (BID) | ORAL | Status: DC
  Administered 2023-01-13 – 2023-01-14 (×2): 40 mg via ORAL
  Filled 2023-01-13 (×2): qty 1

## 2023-01-13 MED ORDER — CLOPIDOGREL BISULFATE 75 MG PO TABS
ORAL_TABLET | ORAL | Status: DC | PRN
  Administered 2023-01-13: 600 mg via ORAL

## 2023-01-13 MED ORDER — FLUTICASONE FUROATE-VILANTEROL 200-25 MCG/ACT IN AEPB
1.0000 | INHALATION_SPRAY | Freq: Every day | RESPIRATORY_TRACT | Status: DC
  Filled 2023-01-13 (×2): qty 28

## 2023-01-13 MED ORDER — POLYETHYLENE GLYCOL 3350 17 G PO PACK: 17.0000 g | PACK | Freq: Every day | ORAL | Status: DC | PRN

## 2023-01-13 MED ORDER — EZETIMIBE 10 MG PO TABS
10.0000 mg | ORAL_TABLET | Freq: Every day | ORAL | Status: DC
  Administered 2023-01-13: 10 mg via ORAL
  Filled 2023-01-13 (×2): qty 1

## 2023-01-13 MED ORDER — GUAIFENESIN-CODEINE 100-10 MG/5ML PO SYRP: 5.0000 mL | ORAL_SOLUTION | Freq: Three times a day (TID) | ORAL | Status: DC | PRN

## 2023-01-13 MED ORDER — NITROGLYCERIN 0.4 MG SL SUBL: 0.4000 mg | SUBLINGUAL_TABLET | SUBLINGUAL | Status: DC | PRN

## 2023-01-13 MED ORDER — ASPIRIN 81 MG PO TBEC
81.0000 mg | DELAYED_RELEASE_TABLET | Freq: Every day | ORAL | Status: DC
  Administered 2023-01-14: 81 mg via ORAL
  Filled 2023-01-13: qty 1

## 2023-01-13 MED ORDER — FAMOTIDINE 20 MG PO TABS
20.0000 mg | ORAL_TABLET | Freq: Every day | ORAL | Status: DC
  Administered 2023-01-14: 20 mg via ORAL
  Filled 2023-01-13: qty 1

## 2023-01-13 MED ORDER — MIDAZOLAM HCL 2 MG/2ML IJ SOLN
INTRAMUSCULAR | Status: AC
  Filled 2023-01-13: qty 2

## 2023-01-13 MED ORDER — ASPIRIN 81 MG PO CHEW: 81.0000 mg | CHEWABLE_TABLET | ORAL | Status: DC

## 2023-01-13 MED ORDER — MIDAZOLAM HCL 2 MG/2ML IJ SOLN
INTRAMUSCULAR | Status: DC | PRN
  Administered 2023-01-13 (×2): 1 mg via INTRAVENOUS

## 2023-01-13 MED ORDER — SODIUM CHLORIDE 0.9% FLUSH: 3.0000 mL | INTRAVENOUS | Status: DC | PRN

## 2023-01-13 MED ORDER — UMECLIDINIUM BROMIDE 62.5 MCG/ACT IN AEPB
1.0000 | INHALATION_SPRAY | Freq: Every day | RESPIRATORY_TRACT | Status: DC
  Filled 2023-01-13 (×2): qty 7

## 2023-01-13 MED ORDER — RANOLAZINE ER 500 MG PO TB12
500.0000 mg | ORAL_TABLET | Freq: Two times a day (BID) | ORAL | Status: DC
  Administered 2023-01-13 – 2023-01-14 (×2): 500 mg via ORAL
  Filled 2023-01-13 (×2): qty 1

## 2023-01-13 MED ORDER — ACETAMINOPHEN 325 MG PO TABS
650.0000 mg | ORAL_TABLET | ORAL | Status: DC | PRN
  Administered 2023-01-14: 650 mg via ORAL
  Filled 2023-01-13: qty 2

## 2023-01-13 MED ORDER — CYCLOBENZAPRINE HCL 10 MG PO TABS: 5.0000 mg | ORAL_TABLET | Freq: Three times a day (TID) | ORAL | Status: DC | PRN

## 2023-01-13 MED ORDER — LOSARTAN POTASSIUM 25 MG PO TABS
25.0000 mg | ORAL_TABLET | Freq: Every day | ORAL | Status: DC
  Administered 2023-01-14: 25 mg via ORAL
  Filled 2023-01-13: qty 1

## 2023-01-13 MED ORDER — ATORVASTATIN CALCIUM 20 MG PO TABS
20.0000 mg | ORAL_TABLET | Freq: Every day | ORAL | Status: DC
  Administered 2023-01-13 – 2023-01-14 (×2): 20 mg via ORAL
  Filled 2023-01-13 (×2): qty 1

## 2023-01-13 MED ORDER — DOCUSATE SODIUM 100 MG PO CAPS
100.0000 mg | ORAL_CAPSULE | Freq: Two times a day (BID) | ORAL | Status: DC
  Filled 2023-01-13 (×2): qty 1

## 2023-01-13 MED ORDER — CARVEDILOL 3.125 MG PO TABS
3.1250 mg | ORAL_TABLET | Freq: Two times a day (BID) | ORAL | Status: DC
  Administered 2023-01-13 – 2023-01-14 (×2): 3.125 mg via ORAL
  Filled 2023-01-13 (×2): qty 1

## 2023-01-13 MED ORDER — MUPIROCIN 2 % EX OINT
1.0000 | TOPICAL_OINTMENT | Freq: Two times a day (BID) | CUTANEOUS | Status: DC
  Administered 2023-01-13 – 2023-01-14 (×2): 1 via TOPICAL
  Filled 2023-01-13: qty 22

## 2023-01-13 MED ORDER — CLOPIDOGREL BISULFATE 75 MG PO TABS
ORAL_TABLET | ORAL | Status: AC
  Filled 2023-01-13: qty 8

## 2023-01-13 MED ORDER — LIDOCAINE HCL 1 % IJ SOLN
INTRAMUSCULAR | Status: AC
  Filled 2023-01-13: qty 20

## 2023-01-13 MED ORDER — SODIUM CHLORIDE 0.9% FLUSH: 3.0000 mL | Freq: Two times a day (BID) | INTRAVENOUS | Status: DC

## 2023-01-13 MED ORDER — AZELASTINE HCL 0.1 % NA SOLN: 1.0000 | Freq: Every evening | NASAL | Status: DC | PRN

## 2023-01-13 MED ORDER — ONDANSETRON HCL 4 MG/2ML IJ SOLN: 4.0000 mg | Freq: Four times a day (QID) | INTRAMUSCULAR | Status: DC | PRN

## 2023-01-13 MED ORDER — SODIUM CHLORIDE 0.9 % IV SOLN
INTRAVENOUS | Status: DC
  Administered 2023-01-13: 1000 mL via INTRAVENOUS

## 2023-01-13 MED ORDER — SODIUM CHLORIDE 0.9 % IV SOLN: 250.0000 mL | INTRAVENOUS | Status: DC | PRN

## 2023-01-13 MED ORDER — TAMSULOSIN HCL 0.4 MG PO CAPS
0.4000 mg | ORAL_CAPSULE | Freq: Every day | ORAL | Status: DC
  Filled 2023-01-13: qty 1

## 2023-01-13 MED ORDER — HEPARIN SODIUM (PORCINE) 1000 UNIT/ML IJ SOLN
INTRAMUSCULAR | Status: DC | PRN
  Administered 2023-01-13: 4500 [IU] via INTRAVENOUS
  Administered 2023-01-13: 5000 [IU] via INTRAVENOUS

## 2023-01-13 MED ORDER — VERAPAMIL HCL 2.5 MG/ML IV SOLN
INTRAVENOUS | Status: AC
  Filled 2023-01-13: qty 2

## 2023-01-13 MED ORDER — GABAPENTIN 300 MG PO CAPS
600.0000 mg | ORAL_CAPSULE | Freq: Three times a day (TID) | ORAL | Status: DC
  Administered 2023-01-13 – 2023-01-14 (×3): 600 mg via ORAL
  Filled 2023-01-13 (×3): qty 2

## 2023-01-13 MED ORDER — HEPARIN SODIUM (PORCINE) 1000 UNIT/ML IJ SOLN
INTRAMUSCULAR | Status: AC
  Filled 2023-01-13: qty 10

## 2023-01-13 MED ORDER — SODIUM CHLORIDE 0.9% FLUSH
3.0000 mL | Freq: Two times a day (BID) | INTRAVENOUS | Status: DC
  Administered 2023-01-13 – 2023-01-14 (×3): 3 mL via INTRAVENOUS

## 2023-01-13 SURGICAL SUPPLY — 26 items
BALLN MINITREK RX 2.0X15 (BALLOONS) ×2
BALLN TREK RX 2.5X15 (BALLOONS) ×2
BALLN ~~LOC~~ TREK NEO RX 3.0X8 (BALLOONS) IMPLANT
BALLOON MINITREK RX 2.0X15 (BALLOONS) IMPLANT
BALLOON TREK RX 2.5X15 (BALLOONS) IMPLANT
CATH 5F 110X4 TIG (CATHETERS) IMPLANT
CATH BALLN WEDGE 5F 110CM (CATHETERS) IMPLANT
CATH LAUNCHER 6FR EBU 4 (CATHETERS) IMPLANT
CATH VISTA GUIDE 6FR XBLAD3.5 (CATHETERS) IMPLANT
DEVICE RAD TR BAND REGULAR (VASCULAR PRODUCTS) IMPLANT
DRAPE BRACHIAL (DRAPES) IMPLANT
GLIDESHEATH SLEND SS 6F .021 (SHEATH) IMPLANT
GUIDEWIRE .025 260CM (WIRE) IMPLANT
GUIDEWIRE INQWIRE 1.5J.035X260 (WIRE) IMPLANT
INQWIRE 1.5J .035X260CM (WIRE) ×2
KIT ENCORE 26 ADVANTAGE (KITS) IMPLANT
PACK CARDIAC CATH (CUSTOM PROCEDURE TRAY) ×3 IMPLANT
PROTECTION STATION PRESSURIZED (MISCELLANEOUS) ×2
SET ATX-X65L (MISCELLANEOUS) IMPLANT
SHEATH GLIDE SLENDER 4/5FR (SHEATH) IMPLANT
STATION PROTECTION PRESSURIZED (MISCELLANEOUS) IMPLANT
STENT ONYX FRONTIER 3.0X18 (Permanent Stent) IMPLANT
TUBING CIL FLEX 10 FLL-RA (TUBING) IMPLANT
WIRE ASAHI FIELDER XT 300CM (WIRE) IMPLANT
WIRE RUNTHROUGH .014X180CM (WIRE) IMPLANT
WIRE RUNTHROUGH IZANAI 014 180 (WIRE) IMPLANT

## 2023-01-13 NOTE — Interval H&P Note (Signed)
History and Physical Interval Note:  01/13/2023 12:02 PM  [name]  has presented today for surgery, with the diagnosis of R and L Cath   Unstable angina.  The various methods of treatment have been discussed with the patient and family. After consideration of risks, benefits and other options for treatment, the patient has consented to  Procedure(s): RIGHT/LEFT HEART CATH AND CORONARY ANGIOGRAPHY (Bilateral) as a surgical intervention.  The patient's history has been reviewed, patient examined, no change in status, stable for surgery.  I have reviewed the patient's chart and labs.  Questions were answered to the patient's satisfaction.     Lorine Bears

## 2023-01-14 DIAGNOSIS — I25110 Atherosclerotic heart disease of native coronary artery with unstable angina pectoris: Secondary | ICD-10-CM | POA: Diagnosis not present

## 2023-01-14 DIAGNOSIS — I200 Unstable angina: Secondary | ICD-10-CM | POA: Diagnosis not present

## 2023-01-14 DIAGNOSIS — R9439 Abnormal result of other cardiovascular function study: Secondary | ICD-10-CM | POA: Insufficient documentation

## 2023-01-14 DIAGNOSIS — I25720 Atherosclerosis of autologous artery coronary artery bypass graft(s) with unstable angina pectoris: Secondary | ICD-10-CM | POA: Diagnosis not present

## 2023-01-14 LAB — BASIC METABOLIC PANEL
Anion gap: 6 (ref 5–15)
BUN: 30 mg/dL — ABNORMAL HIGH (ref 8–23)
CO2: 28 mmol/L (ref 22–32)
Calcium: 8.7 mg/dL — ABNORMAL LOW (ref 8.9–10.3)
Chloride: 105 mmol/L (ref 98–111)
Creatinine, Ser: 1.21 mg/dL (ref 0.61–1.24)
GFR, Estimated: >60 mL/min (ref >60)
Glucose, Bld: 156 mg/dL — ABNORMAL HIGH (ref 70–99)
Potassium: 3.6 mmol/L (ref 3.5–5.1)
Sodium: 139 mmol/L (ref 135–145)

## 2023-01-14 LAB — CBC
HCT: 35.6 % — ABNORMAL LOW (ref 39.0–52.0)
Hemoglobin: 12.2 g/dL — ABNORMAL LOW (ref 13.0–17.0)
MCH: 32.5 pg (ref 26.0–34.0)
MCHC: 34.3 g/dL (ref 30.0–36.0)
MCV: 94.9 fL (ref 80.0–100.0)
Platelets: 180 10*3/uL (ref 150–400)
RBC: 3.75 MIL/uL — ABNORMAL LOW (ref 4.22–5.81)
RDW: 12.7 % (ref 11.5–15.5)
WBC: 10.8 10*3/uL — ABNORMAL HIGH (ref 4.0–10.5)
nRBC: 0 % (ref 0.0–0.2)

## 2023-01-14 MED ORDER — FUROSEMIDE 40 MG PO TABS: 40.0000 mg | ORAL_TABLET | Freq: Two times a day (BID) | ORAL | 3 refills | Status: DC

## 2023-01-14 MED ORDER — CLOPIDOGREL BISULFATE 75 MG PO TABS: 75.0000 mg | ORAL_TABLET | Freq: Every day | ORAL | 3 refills | Status: DC

## 2023-01-14 NOTE — Discharge Summary (Addendum)
Discharge Summary    Patient ID: [name] MRN: [id number]; DOB: 05-28-1941  Admit date: 01/13/2023 Discharge date: 01/14/2023  PCP:  Eustaquio Boyden, MD   [facility] Providers Cardiologist:  Julien Nordmann, MD   {  Discharge Diagnoses    Principal Problem:   Angina pectoris [facility]) Active Problems:   Hyperlipidemia associated with type 2 diabetes mellitus [facility])   Essential hypertension   Coronary artery disease involving native coronary artery of native heart with unstable angina pectoris (HCC)   Acute on chronic systolic heart failure (HCC)   Abnormal stress test   Diagnostic Studies/Procedures    Cardiac catheterization 01/13/23   Ost LM to LM lesion is 50% stenosed.   Ost LAD to Prox LAD lesion is 100% stenosed.   Mid LAD lesion is 60% stenosed.   2nd Diag lesion is 50% stenosed.   Prox RCA-1 lesion is 99% stenosed.   Prox RCA-2 lesion is 90% stenosed.   Prox Cx to Mid Cx lesion is 5% stenosed.   Ost Cx to Prox Cx lesion is 40% stenosed.   Mid Cx to Dist Cx lesion is 99% stenosed.   Dist Cx lesion is 50% stenosed.   Dist LAD lesion is 40% stenosed.   A drug-eluting stent was successfully placed using a STENT ONYX FRONTIER 3.0X18.   Post intervention, there is a 10% residual stenosis.   LIMA and is moderate in size.   and is anatomically normal.   The graft exhibits no disease.   There is moderate left ventricular systolic dysfunction.   LV end diastolic pressure is moderately elevated.   The left ventricular ejection fraction is 35-45% by visual estimate.   1.  Left dominant coronary arteries with severe three-vessel coronary artery disease.  Patent grafts including LIMA to LAD and SVG to first and second diagonals.  Patent mid left circumflex stent.  However, there is subtotal occlusion of the mid to distal left circumflex with collaterals from the LAD. 2.  Moderately reduced LV systolic function. 3.  Right heart catheterization showed mildly  elevated RA pressure, moderate pulmonary hypertension, moderately to severely elevated wedge pressure and normal cardiac output. 4.  Difficult but successful angioplasty and drug-eluting stent placement to the mid/distal left circumflex.  The procedure was difficult due to calcifications and subtotal occlusion with difficulty crossing.   Recommendations: Dual antiplatelet therapy for at least 6 months and preferably longer. Aggressive treatment of risk factors. I increased the dose of oral furosemide to 40 mg twice daily which will be continued while hospitalized and then likely decreased to 40 mg once daily upon discharge.  He was only taking 20 mg daily of furosemide.  Coronary Diagrams  Diagnostic Dominance: Left  Intervention     _____________   History of Present Illness     [name] is a 82 y.o. male with a history of CAD status post remote two-vessel CABG in 1998 with subsequent PCI/BMS in the left circumflex in 2002, HFrEF secondary to ischemic cardiomyopathy, PAD, bladder cancer status post TURB in April 2024, undergoing BCG, nonobstructive carotid artery disease, left retinal artery occlusion, peripheral neuropathy, COPD, diabetes type 2, hypertension, hyperlipidemia, spinal stenosis with chronic lower extremity pain and neuropathy, DVT of the left lower extremity, BPH, GERD, and OSA who had recent abnormal Lexiscan.  Echo in April 2024 for SOB showed new reduced EF of 40 to 45%, grade 1 diastolic dysfunction.  For further ischemic evaluation a Myoview Lexiscan was ordered.  Lexiscan on  12/27/2022 was high risk with a moderate in size, severe, partially reversible basal and mid inferior, inferolateral defect consistent with ischemia and an element of scar.  LVEF 25 to 35%.  Compared to prior study in 2022, the inferior lateral defect and reduced LVEF are new.  Given abnormal findings, is recommended the patient increase Imdur to 30 mg daily seen in the office.  Patient  was seen on 5/24/204 reporting a 34-month history of exertional shortness of breath, fatigue, just chest discomfort.  Patient was set up for an left cardiac cath.  Hospital Course     Consultants: None  Patient arrived for outpatient cardiac catheterization on 01/13/2023.  Catheterization showed left dominant coronary arteries with severe three-vessel CAD.  Patent grafts including LIMA to LAD and SVG to first and second diagonals.  Patent mid left circumflex stent.  He was noted to have subtotal occlusion of the mid to distal left circumflex with collaterals from LAD.  Moderately reduced LV systolic function.  Right heart cath showed mildly elevated RA pressure moderate pulmonary hypertension, moderately to severely elevated wedge pressure and normal cardiac output.  Patient was treated with DES placement to the mid/distal left circumflex.  He was started on dual antiplatelet aspirin and Plavix therapy for at least 6 months, preferably longer.  Patient was admitted for observation overnight. He had no complications. He denies any chest pain or shortness of breath.  Cath sites remain stable.  He has ambulated without symptoms.  Follow-up labs are stable.  Plan to discharge patient on increased dose of Lasix, Lasix 40 mg daily.  To continue aspirin 81 mg daily, Lipitor 20 mg daily, Coreg 3.125 mg twice daily, Zetia 10 mg daily, Imdur milligrams daily, losartan 25 mg daily, and Ranexa 500 mg twice daily.   Patient was seen by Dr. Duke Salvia on 01/14/2023 and felt to be stable for discharge.  Constitutional:      Appearance: He is well-developed.  HENT:     Head: Normocephalic and atraumatic.  Eyes:     General:        Right eye: No discharge.        Left eye: No discharge.  Neck:     Vascular: No JVD.  Cardiovascular:     Rate and Rhythm: Normal rate and regular rhythm.     Heart sounds: S1 normal and S2 normal. Heart sounds not distant. No midsystolic click and no opening snap. Murmur heard.      Systolic murmur is present with a grade of 1/6 at the upper right sternal border.     No friction rub.  Pulmonary:     Effort: Pulmonary effort is normal. No respiratory distress.     Breath sounds: Normal breath sounds. No decreased breath sounds, wheezing or rales.  Chest:     Chest wall: No tenderness.  Abdominal:     General: There is no distension.  Musculoskeletal:     Cervical back: Normal range of motion.     Comments: Constitutional:      Appearance: He is well-developed.  HENT:     Head: Normocephalic and atraumatic.  Eyes:     General:        Right eye: No discharge.        Left eye: No discharge.  Neck:     Vascular: No JVD.  Cardiovascular:     Rate and Rhythm: Normal rate and regular rhythm.     Heart sounds: S1 normal and S2 normal. Heart sounds not distant.  No midsystolic click and no opening snap. Murmur heard.     Systolic murmur is present with a grade of 1/6 at the upper right sternal border.     No friction rub.  Pulmonary:     Effort: Pulmonary effort is normal. No respiratory distress.     Breath sounds: Normal breath sounds. No decreased breath sounds, wheezing or rales.  Chest:     Chest wall: No tenderness.  Abdominal:     General: There is no distension.  Musculoskeletal:     Cervical back: Normal range of motion.     Comments: euvolemic Skin:    General: Skin is warm and dry.     Nails: There is no clubbing.  Neurological:     Mental Status: He is alert and oriented to person, place, and time.  Psychiatric:        Speech: Speech normal.        Behavior: Behavior normal.        Thought Content: Thought content normal.        Judgment: Judgment normal.  Skin:    General: Skin is warm and dry.     Nails: There is no clubbing.  Neurological:     Mental Status: He is alert and oriented to person, place, and time.  Psychiatric:        Speech: Speech normal.        Behavior: Behavior normal.        Thought Content: Thought content normal.         Judgment: Judgment normal.      Did the patient have an acute coronary syndrome (MI, NSTEMI, STEMI, etc) this admission?:  Yes                               AHA/ACC Clinical Performance & Quality Measures: Aspirin prescribed? - Yes ADP Receptor Inhibitor (Plavix/Clopidogrel, Brilinta/Ticagrelor or Effient/Prasugrel) prescribed (includes medically managed patients)? - Yes Beta Blocker prescribed? - Yes High Intensity Statin (Lipitor 40-80mg  or Crestor 20-40mg ) prescribed? - Yes EF assessed during THIS hospitalization? - Yes For EF <40%, was ACEI/ARB prescribed? - Not Applicable (EF >/= 40%) For EF <40%, Aldosterone Antagonist (Spironolactone or Eplerenone) prescribed? - Not Applicable (EF >/= 40%) Cardiac Rehab Phase II ordered (including medically managed patients)? - Yes         _____________  Discharge Vitals Blood pressure (!) 143/68, pulse 68, temperature 97.6 F (36.4 C), temperature source Oral, resp. rate 20, height 6' (1.829 m), weight 93.4 kg, SpO2 94 %.  Filed Weights   01/13/23 1106  Weight: 93.4 kg    Labs & Radiologic Studies    CBC Recent Labs    01/14/23 0448  WBC 10.8*  HGB 12.2*  HCT 35.6*  MCV 94.9  PLT 180   Basic Metabolic Panel Recent Labs    16/10/96 0448  NA 139  K 3.6  CL 105  CO2 28  GLUCOSE 156*  BUN 30*  CREATININE 1.21  CALCIUM 8.7*   Liver Function Tests No results for input(s): "AST", "ALT", "ALKPHOS", "BILITOT", "PROT", "ALBUMIN" in the last 72 hours. No results for input(s): "LIPASE", "AMYLASE" in the last 72 hours. High Sensitivity Troponin:   No results for input(s): "TROPONINIHS" in the last 720 hours.  BNP Invalid input(s): "POCBNP" D-Dimer No results for input(s): "DDIMER" in the last 72 hours. Hemoglobin A1C No results for input(s): "HGBA1C" in the last 72 hours. Fasting Lipid Panel  No results for input(s): "CHOL", "HDL", "LDLCALC", "TRIG", "CHOLHDL", "LDLDIRECT" in the last 72 hours. Thyroid Function  Tests No results for input(s): "TSH", "T4TOTAL", "T3FREE", "THYROIDAB" in the last 72 hours.  Invalid input(s): "FREET3" _____________  CARDIAC CATHETERIZATION  Result Date: 01/13/2023   Ost LM to LM lesion is 50% stenosed.   Ost LAD to Prox LAD lesion is 100% stenosed.   Mid LAD lesion is 60% stenosed.   2nd Diag lesion is 50% stenosed.   Prox RCA-1 lesion is 99% stenosed.   Prox RCA-2 lesion is 90% stenosed.   Prox Cx to Mid Cx lesion is 5% stenosed.   Ost Cx to Prox Cx lesion is 40% stenosed.   Mid Cx to Dist Cx lesion is 99% stenosed.   Dist Cx lesion is 50% stenosed.   Dist LAD lesion is 40% stenosed.   A drug-eluting stent was successfully placed using a STENT ONYX FRONTIER 3.0X18.   Post intervention, there is a 10% residual stenosis.   LIMA and is moderate in size.   and is anatomically normal.   The graft exhibits no disease.   There is moderate left ventricular systolic dysfunction.   LV end diastolic pressure is moderately elevated.   The left ventricular ejection fraction is 35-45% by visual estimate. 1.  Left dominant coronary arteries with severe three-vessel coronary artery disease.  Patent grafts including LIMA to LAD and SVG to first and second diagonals.  Patent mid left circumflex stent.  However, there is subtotal occlusion of the mid to distal left circumflex with collaterals from the LAD. 2.  Moderately reduced LV systolic function. 3.  Right heart catheterization showed mildly elevated RA pressure, moderate pulmonary hypertension, moderately to severely elevated wedge pressure and normal cardiac output. 4.  Difficult but successful angioplasty and drug-eluting stent placement to the mid/distal left circumflex.  The procedure was difficult due to calcifications and subtotal occlusion with difficulty crossing. Recommendations: Dual antiplatelet therapy for at least 6 months and preferably longer. Aggressive treatment of risk factors. I increased the dose of oral furosemide to 40 mg twice  daily which will be continued while hospitalized and then likely decreased to 40 mg once daily upon discharge.  He was only taking 20 mg daily of furosemide.   NM Myocar Multi W/Spect W/Wall Motion / EF  Result Date: 12/28/2022   Abnormal pharmacologic myocardial perfusion stress test.   There is a moderate in size, severe, partially reversible basal and mid inferior/inferolateral defect consistent with ischemia and an element of scar.   Left ventricular systolic function is severely reduced (LVEF 25-35%).   Post-CABG findings, coronary artery calcification, and aortic atherosclerosis are noted on the attenuation correction CT.   1.4 cm hypodensity is noted in the liver, similar to the prior abdominal CT from 10/04/2021.   Compared with the prior study from 04/22/2021, inferior/inferolateral defect and reduced LVEF are new.   This is a high-risk study.   Disposition   Pt is being discharged home today in good condition.  Follow-up Plans & Appointments     Discharge Instructions     AMB Referral to Cardiac Rehabilitation - Phase II   Complete by: As directed    Diagnosis:  Coronary Stents Stable Angina     After initial evaluation and assessments completed: Virtual Based Care may be provided alone or in conjunction with Phase 2 Cardiac Rehab based on patient barriers.: Yes   Intensive Cardiac Rehabilitation (ICR) [facility] location only OR Traditional Cardiac Rehabilitation (TCR) *If criteria  for ICR are not met will enroll in TCR [facility] only): Yes   Diet - low sodium heart healthy   Complete by: As directed    Increase activity slowly   Complete by: As directed         Discharge Medications   Allergies as of 01/14/2023       Reactions   Vioxx [rofecoxib] Other (See Comments)   Hemorrhage    Spiriva Respimat [tiotropium Bromide Monohydrate] Other (See Comments)   Elevated bp   Contrast Media [iodinated Contrast Media] Itching, Rash   Delayed reaction post abdominal aortagram.    Morphine  Nausea Only, Other (See Comments)   Irritability        Medication List     TAKE these medications    albuterol (2.5 MG/3ML) 0.083% nebulizer solution Commonly known as: PROVENTIL Take 3 mLs (2.5 mg total) by nebulization every 6 (six) hours as needed for wheezing or shortness of breath.   albuterol 108 (90 Base) MCG/ACT inhaler Commonly known as: VENTOLIN HFA TAKE 2 PUFFS INTO LUNGS EVERY 6 HOURS ASNEEDED FOR WHEEZING OR SHORTNESS OF BREATH   aspirin EC 81 MG tablet Take 1 tablet (81 mg total) by mouth daily.   atorvastatin 20 MG tablet Commonly known as: LIPITOR TAKE 1 TABLET BY MOUTH DAILY   Azelastine HCl 0.15 % Soln Commonly known as: Astepro Place 1 spray into the nose at bedtime as needed (Sinus congestion, allergies).   carvedilol 3.125 MG tablet Commonly known as: COREG Take 1 tablet (3.125 mg total) by mouth 2 (two) times daily.   clopidogrel 75 MG tablet Commonly known as: PLAVIX Take 1 tablet (75 mg total) by mouth daily with breakfast.   cyclobenzaprine 5 MG tablet Commonly known as: FLEXERIL Take 1 tablet (5 mg total) by mouth 3 (three) times daily as needed for muscle spasms.   diphenhydrAMINE 50 MG capsule Commonly known as: BENADRYL Take one capsule 1 hour prior to scan.   docusate sodium 100 MG capsule Commonly known as: COLACE Take 1 capsule (100 mg total) by mouth 2 (two) times daily.   ezetimibe 10 MG tablet Commonly known as: ZETIA Take 1 tablet (10 mg total) by mouth daily.   famotidine 20 MG tablet Commonly known as: PEPCID Take 20 mg by mouth daily.   fluticasone 50 MCG/ACT nasal spray Commonly known as: FLONASE Place 1 spray into both nostrils daily as needed for allergies or rhinitis.   furosemide 40 MG tablet Commonly known as: LASIX Take 1 tablet (40 mg total) by mouth 2 (two) times daily. What changed:  medication strength how much to take how to take this when to take this additional instructions   gabapentin 600  MG tablet Commonly known as: NEURONTIN TAKE ONE TABLET BY MOUTH 3 TIMES DAILY   Gemtesa 75 MG Tabs Generic drug: Vibegron Take 1 tablet (75 mg total) by mouth daily.   guaiFENesin-codeine 100-10 MG/5ML syrup Commonly known as: ROBITUSSIN AC Take 5 mLs by mouth 3 (three) times daily as needed for cough.   HYDROcodone-acetaminophen 5-325 MG tablet Commonly known as: NORCO/VICODIN Take 1-2 tablets by mouth every 4 (four) hours as needed for moderate pain.   isosorbide mononitrate 30 MG 24 hr tablet Commonly known as: IMDUR Take 1 tablet (30 mg total) by mouth daily.   losartan 25 MG tablet Commonly known as: COZAAR TAKE 1 TABLET BY MOUTH DAILY   mupirocin ointment 2 % Commonly known as: BACTROBAN Apply 1 Application topically 2 (two) times daily.  nitroGLYCERIN 0.4 MG SL tablet Commonly known as: NITROSTAT Place 1 tablet (0.4 mg total) under the tongue every 5 (five) minutes as needed for chest pain.   polyethylene glycol 17 g packet Commonly known as: MIRALAX / GLYCOLAX Take 17 g by mouth daily as needed for mild constipation.   predniSONE 50 MG tablet Commonly known as: DELTASONE Take one tablet 13 hours, 7 hours, and 1 hour prior to scan.   ranolazine 500 MG 12 hr tablet Commonly known as: RANEXA TAKE ONE (1) TABLET BY MOUTH TWO TIMES PER DAY   silodosin 8 MG Caps capsule Commonly known as: RAPAFLO TAKE ONE CAPSULE BY MOUTH DAILY WITH BREAKFAST.   Trelegy Ellipta 200-62.5-25 MCG/ACT Aepb Generic drug: Fluticasone-Umeclidin-Vilant Inhale 1 puff into the lungs daily.   Trelegy Ellipta 200-62.5-25 MCG/ACT Aepb Generic drug: Fluticasone-Umeclidin-Vilant Inhale 1 puff into the lungs daily.           Outstanding Labs/Studies   [name]  Duration of Discharge Encounter   Greater than 30 minutes including physician time.  Signed, Naiah Donahoe David Stall, PA-C 01/14/2023, 10:02 AM

## 2023-01-14 NOTE — Discharge Instructions (Signed)
No driving for 1 week. No lifting over 10 lbs for 2 weeks. No sexual activity for 2 weeks.  Keep procedure site clean & dry. If you notice increased pain, swelling, bleeding or pus, call/return!  You may shower, but no soaking baths/hot tubs/pools for 1 week.   

## 2023-01-16 ENCOUNTER — Encounter: Payer: Self-pay | Admitting: Cardiovascular Disease

## 2023-01-16 LAB — POCT I-STAT EG7
Acid-base deficit: 1 mmol/L (ref 0.0–2.0)
Bicarbonate: 24.4 mmol/L (ref 20.0–28.0)
Calcium, Ion: 1.24 mmol/L (ref 1.15–1.40)
HCT: 38 % — ABNORMAL LOW (ref 39.0–52.0)
Hemoglobin: 12.9 g/dL — ABNORMAL LOW (ref 13.0–17.0)
O2 Saturation: 64 %
Potassium: 4 mmol/L (ref 3.5–5.1)
Sodium: 141 mmol/L (ref 135–145)
TCO2: 26 mmol/L (ref 22–32)
pCO2, Ven: 44.7 mmHg (ref 44–60)
pH, Ven: 7.346 (ref 7.25–7.43)
pO2, Ven: 35 mmHg (ref 32–45)

## 2023-01-16 LAB — POCT I-STAT 7, (LYTES, BLD GAS, ICA,H+H)
Acid-base deficit: 2 mmol/L (ref 0.0–2.0)
Bicarbonate: 23.4 mmol/L (ref 20.0–28.0)
Calcium, Ion: 1.23 mmol/L (ref 1.15–1.40)
HCT: 39 % (ref 39.0–52.0)
Hemoglobin: 13.3 g/dL (ref 13.0–17.0)
O2 Saturation: 86 %
Potassium: 4 mmol/L (ref 3.5–5.1)
Sodium: 141 mmol/L (ref 135–145)
TCO2: 25 mmol/L (ref 22–32)
pCO2 arterial: 41.8 mmHg (ref 32–48)
pH, Arterial: 7.357 (ref 7.35–7.45)
pO2, Arterial: 53 mmHg — ABNORMAL LOW (ref 83–108)

## 2023-01-16 LAB — LIPOPROTEIN A (LPA): Lipoprotein (a): 18.2 nmol/L (ref <75.0)

## 2023-01-16 LAB — POCT ACTIVATED CLOTTING TIME
Activated Clotting Time: 299 seconds
Activated Clotting Time: 311 seconds

## 2023-01-17 NOTE — Progress Notes (Unsigned)
BCG Bladder Instillation  BCG # 3/6  Due to Bladder Cancer patient is present today for a BCG treatment. Patient was cleaned and prepped in a sterile fashion with betadine. A 14 FR catheter was inserted, urine return was noted 300 ml, urine was yellow clear in color.  50ml of reconstituted BCG was instilled into the bladder. The catheter was then removed. Patient tolerated well, no complications were noted  Performed by: Michiel Cowboy, PA-C and Mammie Lorenzo, RN  Follow up/ Additional notes: Next week for #4/6 BCG

## 2023-01-18 ENCOUNTER — Encounter: Payer: Self-pay | Admitting: Urology

## 2023-01-18 ENCOUNTER — Ambulatory Visit (INDEPENDENT_AMBULATORY_CARE_PROVIDER_SITE_OTHER): Payer: HMO | Admitting: Urology

## 2023-01-18 VITALS — BP 119/68 | HR 76 | Ht 72.0 in | Wt 200.0 lb

## 2023-01-18 DIAGNOSIS — C679 Malignant neoplasm of bladder, unspecified: Secondary | ICD-10-CM | POA: Diagnosis not present

## 2023-01-18 DIAGNOSIS — D494 Neoplasm of unspecified behavior of bladder: Secondary | ICD-10-CM

## 2023-01-18 MED ORDER — BCG LIVE 50 MG IS SUSR
3.2400 mL | Freq: Once | INTRAVESICAL | Status: AC
Start: 2023-01-18 — End: 2023-01-18
  Administered 2023-01-18: 81 mg via INTRAVESICAL

## 2023-01-19 LAB — MICROSCOPIC EXAMINATION

## 2023-01-19 LAB — URINALYSIS, COMPLETE
Bilirubin, UA: NEGATIVE
Glucose, UA: NEGATIVE
Ketones, UA: NEGATIVE
Leukocytes,UA: NEGATIVE
Nitrite, UA: NEGATIVE
Protein,UA: NEGATIVE
RBC, UA: NEGATIVE
Specific Gravity, UA: 1.01 (ref 1.005–1.030)
Urobilinogen, Ur: 0.2 mg/dL (ref 0.2–1.0)
pH, UA: 5.5 (ref 5.0–7.5)

## 2023-01-23 ENCOUNTER — Encounter: Payer: Self-pay | Admitting: Family Medicine

## 2023-01-23 ENCOUNTER — Ambulatory Visit (INDEPENDENT_AMBULATORY_CARE_PROVIDER_SITE_OTHER): Payer: HMO | Admitting: Family Medicine

## 2023-01-23 VITALS — BP 132/64 | HR 74 | Temp 97.6°F | Ht 72.0 in | Wt 208.0 lb

## 2023-01-23 DIAGNOSIS — S81802D Unspecified open wound, left lower leg, subsequent encounter: Secondary | ICD-10-CM | POA: Diagnosis not present

## 2023-01-23 NOTE — Patient Instructions (Signed)
Wound continues healing well.  Keep follow up appointment for next month.

## 2023-01-23 NOTE — Progress Notes (Signed)
Ph: [phone] Fax: [phone]   Patient ID: [name], male    DOB: 1941-04-12, 82 y.o.   MRN: [id number]  This visit was conducted in person.  BP 132/64   Pulse 74   Temp 97.6 F (36.4 C) (Temporal)   Ht 6' (1.829 m)   Wt 208 lb (94.3 kg)   SpO2 95%   BMI 28.21 kg/m    CC: wound check  Subjective:   HPI: [name] is a 82 y.o. male presenting on 01/23/2023 for Wound Check (Here for 10-14 day wound chk. )   See prior note for details.  Recent LLE cellulitis after abrasion to anterior L leg after fall in yard - initially treated with keflex + doxy x7 days + topical mupirocin.  Wound culture 01/03/2023: no growth, Final.   Recent heart catheterization s/p angioplasty and stent - now on aspirin + plavix.  He notes improvement since stent placed. He notes left leg swelling has almost resolved.      Relevant past medical, surgical, family and social history reviewed and updated as indicated. Interim medical history since our last visit reviewed. Allergies and medications reviewed and updated. Outpatient Medications Prior to Visit  Medication Sig Dispense Refill   albuterol (PROVENTIL) (2.5 MG/3ML) 0.083% nebulizer solution Take 3 mLs (2.5 mg total) by nebulization every 6 (six) hours as needed for wheezing or shortness of breath. 75 mL 12   albuterol (VENTOLIN HFA) 108 (90 Base) MCG/ACT inhaler TAKE 2 PUFFS INTO LUNGS EVERY 6 HOURS ASNEEDED FOR WHEEZING OR SHORTNESS OF BREATH 8.5 g 6   aspirin EC 81 MG EC tablet Take 1 tablet (81 mg total) by mouth daily. 30 tablet 0   atorvastatin (LIPITOR) 20 MG tablet TAKE 1 TABLET BY MOUTH DAILY 90 tablet 1   carvedilol (COREG) 3.125 MG tablet Take 1 tablet (3.125 mg total) by mouth 2 (two) times daily. 180 tablet 3   clopidogrel (PLAVIX) 75 MG tablet Take 1 tablet (75 mg total) by mouth daily with breakfast. 90 tablet 3   cyclobenzaprine (FLEXERIL) 5 MG tablet Take 1 tablet (5 mg total) by mouth 3 (three) times daily as  needed for muscle spasms. 30 tablet 0   docusate sodium (COLACE) 100 MG capsule Take 1 capsule (100 mg total) by mouth 2 (two) times daily. 30 capsule 0   ezetimibe (ZETIA) 10 MG tablet Take 1 tablet (10 mg total) by mouth daily. 90 tablet 3   famotidine (PEPCID) 20 MG tablet Take 20 mg by mouth daily.     fluticasone (FLONASE) 50 MCG/ACT nasal spray Place 1 spray into both nostrils daily as needed for allergies or rhinitis.     Fluticasone-Umeclidin-Vilant (TRELEGY ELLIPTA) 200-62.5-25 MCG/ACT AEPB Inhale 1 puff into the lungs daily. 180 each 0   Fluticasone-Umeclidin-Vilant (TRELEGY ELLIPTA) 200-62.5-25 MCG/ACT AEPB Inhale 1 puff into the lungs daily. 14 each 0   furosemide (LASIX) 40 MG tablet Take 1 tablet (40 mg total) by mouth 2 (two) times daily. 90 tablet 3   gabapentin (NEURONTIN) 600 MG tablet TAKE ONE TABLET BY MOUTH 3 TIMES DAILY 270 tablet 1   guaiFENesin-codeine (ROBITUSSIN AC) 100-10 MG/5ML syrup Take 5 mLs by mouth 3 (three) times daily as needed for cough. 120 mL 0   HYDROcodone-acetaminophen (NORCO/VICODIN) 5-325 MG tablet Take 1-2 tablets by mouth every 4 (four) hours as needed for moderate pain. 30 tablet 0   isosorbide mononitrate (IMDUR) 30 MG 24 hr tablet Take 1 tablet (30 mg total)  by mouth daily. 90 tablet 3   losartan (COZAAR) 25 MG tablet TAKE 1 TABLET BY MOUTH DAILY 90 tablet 0   mupirocin ointment (BACTROBAN) 2 % Apply 1 Application topically 2 (two) times daily. 22 g 0   nitroGLYCERIN (NITROSTAT) 0.4 MG SL tablet Place 1 tablet (0.4 mg total) under the tongue every 5 (five) minutes as needed for chest pain. 25 tablet 3   polyethylene glycol (MIRALAX / GLYCOLAX) 17 g packet Take 17 g by mouth daily as needed for mild constipation.      ranolazine (RANEXA) 500 MG 12 hr tablet TAKE ONE (1) TABLET BY MOUTH TWO TIMES PER DAY 180 tablet 3   silodosin (RAPAFLO) 8 MG CAPS capsule TAKE ONE CAPSULE BY MOUTH DAILY WITH BREAKFAST. 30 capsule 0   Vibegron (GEMTESA) 75 MG TABS Take  1 tablet (75 mg total) by mouth daily. 42 tablet 0   Azelastine HCl (ASTEPRO) 0.15 % SOLN Place 1 spray into the nose at bedtime as needed (Sinus congestion, allergies). 30 mL 11   diphenhydrAMINE (BENADRYL) 50 MG capsule Take one capsule 1 hour prior to scan. 1 capsule 0   predniSONE (DELTASONE) 50 MG tablet Take one tablet 13 hours, 7 hours, and 1 hour prior to scan. 3 tablet 0   No facility-administered medications prior to visit.     Per HPI unless specifically indicated in ROS section below Review of Systems  Objective:  BP 132/64   Pulse 74   Temp 97.6 F (36.4 C) (Temporal)   Ht 6' (1.829 m)   Wt 208 lb (94.3 kg)   SpO2 95%   BMI 28.21 kg/m   Wt Readings from Last 3 Encounters:  01/23/23 208 lb (94.3 kg)  01/18/23 200 lb (90.7 kg)  01/13/23 206 lb (93.4 kg)      Physical Exam Vitals and nursing note reviewed.  Constitutional:      Appearance: Normal appearance.  Musculoskeletal:     Right lower leg: No edema.     Left lower leg: No edema.  Skin:    General: Skin is warm and dry.     Findings: Wound present.          Comments: 2.5 x 0.3cm open sore to anterior leg distal to knee with decreasing granulation tissue, healing via second intention. No erythema, drainage  Neurological:     Mental Status: He is alert.  Psychiatric:        Mood and Affect: Mood normal.        Behavior: Behavior normal.        Results for orders placed or performed in visit on 01/18/23  Microscopic Examination   Urine  Result Value Ref Range   WBC, UA 0-5 0 - 5 /hpf   RBC, Urine 0-2 0 - 2 /hpf   Epithelial Cells (non renal) 0-10 0 - 10 /hpf   Bacteria, UA Few None seen/Few  Urinalysis, Complete  Result Value Ref Range   Specific Gravity, UA 1.010 1.005 - 1.030   pH, UA 5.5 5.0 - 7.5   Color, UA Yellow Yellow   Appearance Ur Clear Clear   Leukocytes,UA Negative Negative   Protein,UA Negative Negative/Trace   Glucose, UA Negative Negative   Ketones, UA Negative Negative    RBC, UA Negative Negative   Bilirubin, UA Negative Negative   Urobilinogen, Ur 0.2 0.2 - 1.0 mg/dL   Nitrite, UA Negative Negative   Microscopic Examination See below:    *Note: Due to a large  number of results and/or encounters for the requested time period, some results have not been displayed. A complete set of results can be found in Results Review.    Assessment & Plan:   Problem List Items Addressed This Visit     Traumatic open wound of lower leg with delayed healing, left - Primary    Significant improvement over the past 2 weeks.  Anticipate will continue to heal without incident.  Reassurance provided.         No orders of the defined types were placed in this encounter.   No orders of the defined types were placed in this encounter.   Patient Instructions  Wound continues healing well.  Keep follow up appointment for next month.   Follow up plan: Return if symptoms worsen or fail to improve.  Eustaquio Boyden, MD

## 2023-01-23 NOTE — Assessment & Plan Note (Signed)
Significant improvement over the past 2 weeks.  Anticipate will continue to heal without incident.  Reassurance provided.

## 2023-01-24 NOTE — Progress Notes (Unsigned)
BCG Bladder Instillation  BCG # 4/6  Due to Bladder Cancer patient is present today for a BCG treatment. Patient was cleaned and prepped in a sterile fashion with betadine. A 14 FR catheter was inserted, urine return was noted 200 ml, urine was yellow in color.  50ml of reconstituted BCG was instilled into the bladder. The catheter was then removed. Patient tolerated well, no complications were noted  Performed by: Michiel Cowboy, PA-C and Emi Belfast, CMA   Follow up/ Additional notes: One week for #5/6

## 2023-01-25 ENCOUNTER — Encounter: Payer: Self-pay | Admitting: Cardiovascular Disease

## 2023-01-25 ENCOUNTER — Encounter: Payer: Self-pay | Admitting: *Deleted

## 2023-01-25 ENCOUNTER — Ambulatory Visit (INDEPENDENT_AMBULATORY_CARE_PROVIDER_SITE_OTHER): Payer: HMO | Admitting: Urology

## 2023-01-25 ENCOUNTER — Encounter: Payer: HMO | Attending: Cardiovascular Disease | Admitting: *Deleted

## 2023-01-25 DIAGNOSIS — D494 Neoplasm of unspecified behavior of bladder: Secondary | ICD-10-CM

## 2023-01-25 DIAGNOSIS — C679 Malignant neoplasm of bladder, unspecified: Secondary | ICD-10-CM

## 2023-01-25 DIAGNOSIS — Z955 Presence of coronary angioplasty implant and graft: Secondary | ICD-10-CM

## 2023-01-25 LAB — URINALYSIS, COMPLETE
Bilirubin, UA: NEGATIVE
Glucose, UA: NEGATIVE
Ketones, UA: NEGATIVE
Leukocytes,UA: NEGATIVE
Nitrite, UA: NEGATIVE
Protein,UA: NEGATIVE
RBC, UA: NEGATIVE
Specific Gravity, UA: 1.01 (ref 1.005–1.030)
Urobilinogen, Ur: 0.2 mg/dL (ref 0.2–1.0)
pH, UA: 5.5 (ref 5.0–7.5)

## 2023-01-25 LAB — MICROSCOPIC EXAMINATION: Bacteria, UA: NONE SEEN

## 2023-01-25 MED ORDER — BCG LIVE 50 MG IS SUSR
3.2400 mL | Freq: Once | INTRAVESICAL | Status: AC
Start: 2023-01-25 — End: 2023-01-25
  Administered 2023-01-25: 81 mg via INTRAVESICAL

## 2023-01-25 NOTE — Progress Notes (Signed)
Virtual orientation call completed today. he has an appointment on Date: 02/02/2023  for EP eval and gym Orientation.  Documentation of diagnosis can be found in [facility] Date: 01/13/2023 .

## 2023-01-26 ENCOUNTER — Telehealth: Payer: Self-pay | Admitting: Pharmacist

## 2023-01-26 NOTE — Progress Notes (Signed)
Contacted patient to cancel appointment with [facility] Pharmacist. He denies any medication needs at this time.   Catie Eppie Gibson, PharmD, BCACP, CPP [facility] [phone]

## 2023-01-26 NOTE — Progress Notes (Signed)
Cardiology Office Note:    Date:  01/27/2023   ID:  Ardelia Mems, DOB 1940-09-28, MRN [id number]  PCP:  Iran Ouch, MD  [facility] Cardiologist:  Julien Nordmann, MD  [facility] Electrophysiologist:  None   Referring MD: Iran Ouch, MD   Chief Complaint: Hospital follow-up  History of Present Illness:    [name] is a 82 y.o. 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  No in April 2024 for shortness of breath showed newly reduced EF of 40 to 45%, grade 1 diastolic dysfunction.  For further ischemic evaluation a Myoview Lexiscan was ordered.  Lexiscan 12/27/2022 was high risk with a moderate in size, severe, partially reversible basal and mid inferior, inferolateral defect consistent with ischemia and an element of scar.  EF was 25 to 35%.  Compared to prior study in 2022, the inferior lateral defect and reduced LVEF are new.  Patient was set up for outpatient cardiac cath.  Patient underwent cardiac cath 01/13/2023.  This showed left dominant coronary arteries with severe three-vessel CAD.  Patent grafts including LIMA to LAD, SVG to first and second diagonals, patent mid left circumflex stent.  He was noted to have subtotal occlusion of the mid to distal left circumflex with collaterals from LAD.  Moderately reduced LV SF.  Right heart cath showed mildly elevated RA pressure, moderate pulmonary hypertension, moderately to severely elevated wedge pressure with normal cardiac output.   Patient was treated with DES placement to the mid/distal left circumflex.  He was started on dual antiplatelet therapy with aspirin and Plavix for at least 6 months, preferably longer.  Lasix was increased to 40mg  BID until discharge. Patient was admitted for observation overnight.  Patient had no complications was ultimately discharged the next day.  Today, the patient reports he has a lot of bruising with the plavix. He has been doing word working at home and has noticed bruising on his forearms. Some spots are tender. Vitals are good at home. Weight has been around 204lbs. He denies chest pain or shortness of breath. He starts cardiac rehab when he gets back from his trip. He has been taking lasix 40mg  BID.   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 06/2019 Echo (in setting of COVID): EF 35-40%, mild LVH, g1 DD, glob HK; b. 09/2019 Echo: EF 50-55%, Gr1 DD; c. 03/2021 Echo: EF 50-55%, no rwma, mild LVH, GrI DD, nl RV fxn. RVSP 44.38mmHg. Mild-mod dil LA. Triv MR. Mild-mod Ao sclerosis.   Actinic keratosis    Anginal pain (HCC)    Aortic ectasia, abdominal (HCC)    a.) CT abd 01/01/2022: 2.9 cm infrarenal abdominal aortic ectasia   Bladder cancer (HCC) 2023   BPH (benign prostatic hyperplasia) 09/06/2007   CAD s/p CABG    a.) 1990 s/p MI--> CABG x 3; b.) LHC/PCI 2002 --> BMS (unk type) to LCX; c.) LHC 07/07/2015: 50% oLM-LM, 80% oRCA, 80% pRCA-1, 80% pRCA-2, 60% dLAD, 100% o-pLAD, 60% mLAD, 50% D2, LIMA-LAD,  SVG-D2, SVG-RI --> med Rx; d.) MV 06/05/2020: fixed apical ant/apical defect. No sign ischemia; e.) MV 09/08/202: EF 52%, no ischemia.   Carotid arterial disease (HCC)    a.) 08/2016 Carotid U/S: <39% bilat; b.) carotid doppler 10/12/2022: mild-to-moderate (<50%) bilateral bifurcation plaque.   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   CKD (chronic kidney disease), stage III (HCC)    Community  acquired pneumonia of right lower lobe of lung 06/05/2017   Diverticulosis    Dupuytren's contracture of right hand 10/29/2008   DVT (deep venous thrombosis) (HCC) 1998   Elevated prostate specific antigen (PSA) 07/09/2008   Emphysema of lung (HCC)    GERD 04/30/2007   Heart murmur    History of 2019 novel coronavirus disease (COVID-19) 05/20/2019   History of hiatal hernia    History of shingles    HLD (hyperlipidemia) 04/26/2007   HTN (hypertension) 04/30/2007   Laceration of skin of left hand 03/08/2018   LBBB (left bundle branch block)    Lumbar disc disease with radiculopathy    Migraines    Myocardial infarction (HCC) 1990   a.) resulted in 3v CABG   Myocardial infarction (HCC) 2002   a.) second cardiac event; PCI with placement of BMS (unknown type) to LCx   Neuropathy of both feet    OSA on CPAP    Osteoarthritis    PAD (peripheral artery disease) (HCC)    a. 07/2017 LE duplex: RSFA 75-10m, LSFA 75-81m, 50-74d; c. 08/2018 Periph Angio: No signif AoIliac dzs. Mod-sev Ca2+ RSFA w/ diff dzs throughout- 3 vessel runoff. Borderline signif LSFA dzs w/ mod-sev Ca2+ vessels and 3 vessel runoff below the knee-->Med rx.   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   Right mid dorsum forearm - in situ - [facility]    Past Surgical History:  Procedure Laterality Date   ABDOMINAL AORTOGRAM W/LOWER EXTREMITY [name] 09/12/2018   Procedure: ABDOMINAL AORTOGRAM W/LOWER EXTREMITY;  Surgeon: Iran Ouch, MD;  Location: [facility];  Service: Cardiovascular;  Laterality: [name];   ANTERIOR CERVICAL DECOMP/DISCECTOMY FUSION [name] 07/11/2022   Anterior Cervical Decompression Fusion ,Interboy Prothesis,Plate/Screws , Cervical  four-five,Cervical five-six [name], Tinnie Gens, MD)   APPENDECTOMY     rupture   BACK SURGERY     1984 and then another one [facility]   BLADDER INSTILLATION [name] 10/19/2021   Procedure: BLADDER INSTILLATION OF GEMCITABINE;  Surgeon: Riki Altes, MD;  Location: [facility];  Service: Urology;  Laterality: [name];   BLADDER INSTILLATION [name] 11/15/2022   Procedure: BLADDER INSTILLATION OF GEMCITABINE;  Surgeon: Riki Altes, MD;  Location: [facility];  Service: Urology;  Laterality: [name];   CARDIAC CATHETERIZATION [name] 07/07/2015   Procedure: Left Heart Cath and Cors/Grafts Angiography;  Surgeon: Antonieta Iba, MD;  Location: [facility];  Service: Cardiovascular;  Laterality: [name];   CATARACT EXTRACTION, BILATERAL  2009   CERVICAL SPINE SURGERY  12/2016   cervical stenosis [name])   COLONOSCOPY  03/2010   HP polyp, diverticulosis, rpt 10 yrs ([name])   CORONARY ANGIOPLASTY  11/30/2000   LCX   CORONARY ARTERY BYPASS GRAFT  1990   3 vessel  CORONARY STENT INTERVENTION [name] 01/13/2023   Procedure: CORONARY STENT INTERVENTION;  Surgeon: Iran Ouch, MD;  Location: [facility];  Service: Cardiovascular;  Laterality: [name];   CYSTOSCOPY  12/23/2010   [facility]   JOINT REPLACEMENT     KNEE ARTHROSCOPY Right    x2   LAMINOTOMY  1986   L5/S1 lumbar laminotomy for two ruptured discs/fusion   LUMBAR LAMINECTOMY/DECOMPRESSION MICRODISCECTOMY [name] 07/13/2016   Procedure: LUMBAR TWO-THREE, LUMBAR THREE-FOUR, LUMBAR FOUR-FIVE LAMINECTOMY AND FORAMINOTOMY;  Surgeon: Tressie Stalker, MD;  Location: [facility];  Service: Neurosurgery;  Laterality: [name];  LAMINECTOMY AND FORAMINOTOMY L2-L3, L3-L4,L4-L5   LUMBAR SPINE SURGERY  06/2020   Bilateral redo laminectomy/laminotomy/foraminotomies/medial facetectomy to decompress the bilateral L4 and L5 nerve roots ([place])   RIGHT/LEFT HEART CATH AND CORONARY ANGIOGRAPHY Bilateral 01/13/2023   Procedure: RIGHT/LEFT HEART CATH AND CORONARY ANGIOGRAPHY;   Surgeon: Iran Ouch, MD;  Location: [facility];  Service: Cardiovascular;  Laterality: Bilateral;   TOTAL HIP ARTHROPLASTY Bilateral 1999,2000   TOTAL KNEE ARTHROPLASTY Right 03/06/2017   Procedure: RIGHT TOTAL KNEE ARTHROPLASTY;  Surgeon: Ollen Gross, MD;  Location: [facility];  Service: Orthopedics;  Laterality: Right;   TRANSURETHRAL RESECTION OF BLADDER TUMOR [name] 10/19/2021   Procedure: TRANSURETHRAL RESECTION OF BLADDER TUMOR (TURBT);  Surgeon: Riki Altes, MD;  Location: [facility];  Service: Urology;  Laterality: [name];   TRANSURETHRAL RESECTION OF BLADDER TUMOR [name] 11/15/2022   Procedure: TRANSURETHRAL RESECTION OF BLADDER TUMOR (TURBT);  Surgeon: Riki Altes, MD;  Location: [facility];  Service: Urology;  Laterality: [name];    Current Medications: Current Meds  Medication Sig   albuterol (PROVENTIL) (2.5 MG/3ML) 0.083% nebulizer solution Take 3 mLs (2.5 mg total) by nebulization every 6 (six) hours as needed for wheezing or shortness of breath.   albuterol (VENTOLIN HFA) 108 (90 Base) MCG/ACT inhaler TAKE 2 PUFFS INTO LUNGS EVERY 6 HOURS ASNEEDED FOR WHEEZING OR SHORTNESS OF BREATH   aspirin EC 81 MG EC tablet Take 1 tablet (81 mg total) by mouth daily.   atorvastatin (LIPITOR) 20 MG tablet TAKE 1 TABLET BY MOUTH DAILY   carvedilol (COREG) 3.125 MG tablet Take 1 tablet (3.125 mg total) by mouth 2 (two) times daily.   clopidogrel (PLAVIX) 75 MG tablet Take 1 tablet (75 mg total) by mouth daily with breakfast.   cyclobenzaprine (FLEXERIL) 5 MG tablet Take 1 tablet (5 mg total) by mouth 3 (three) times daily as needed for muscle spasms.   docusate sodium (COLACE) 100 MG capsule Take 1 capsule (100 mg total) by mouth 2 (two) times daily.   ezetimibe (ZETIA) 10 MG tablet Take 1 tablet (10 mg total) by mouth daily.   famotidine (PEPCID) 20 MG tablet Take 20 mg by mouth daily.   fluticasone (FLONASE) 50 MCG/ACT nasal spray Place 1 spray into both nostrils daily as needed for  allergies or rhinitis.   Fluticasone-Umeclidin-Vilant (TRELEGY ELLIPTA) 200-62.5-25 MCG/ACT AEPB Inhale 1 puff into the lungs daily.   Fluticasone-Umeclidin-Vilant (TRELEGY ELLIPTA) 200-62.5-25 MCG/ACT AEPB Inhale 1 puff into the lungs daily.   furosemide (LASIX) 40 MG tablet Take 40 mg by mouth daily.   gabapentin (NEURONTIN) 600 MG tablet TAKE ONE TABLET BY MOUTH 3 TIMES DAILY   guaiFENesin-codeine (ROBITUSSIN AC) 100-10 MG/5ML syrup Take 5 mLs by mouth 3 (three) times daily as needed for cough.   HYDROcodone-acetaminophen (NORCO/VICODIN) 5-325 MG tablet Take 1-2 tablets by mouth every 4 (four) hours as needed for moderate pain.   isosorbide  mononitrate (IMDUR) 30 MG 24 hr tablet Take 1 tablet (30 mg total) by mouth daily.   losartan (COZAAR) 25 MG tablet TAKE 1 TABLET BY MOUTH DAILY   mupirocin ointment (BACTROBAN) 2 % Apply 1 Application topically 2 (two) times daily.   nitroGLYCERIN (NITROSTAT) 0.4 MG SL tablet Place 1 tablet (0.4 mg total) under the tongue every 5 (five) minutes as needed for chest pain.   polyethylene glycol (MIRALAX / GLYCOLAX) 17 g packet Take 17 g by mouth daily as needed for mild constipation.    ranolazine (RANEXA) 500 MG 12 hr tablet TAKE ONE (1) TABLET BY MOUTH TWO TIMES PER DAY   silodosin (RAPAFLO) 8 MG CAPS capsule TAKE ONE CAPSULE BY MOUTH DAILY WITH BREAKFAST.   Vibegron (GEMTESA) 75 MG TABS Take 1 tablet (75 mg total) by mouth daily.   [DISCONTINUED] furosemide (LASIX) 40 MG tablet Take 1 tablet (40 mg total) by mouth 2 (two) times daily. (Patient taking differently: Take 40 mg by mouth daily.)     Allergies:   Vioxx [rofecoxib], Spiriva respimat [tiotropium bromide monohydrate], Contrast media [iodinated contrast media], and Morphine   Social History   Socioeconomic History   Marital status: Married    Spouse name: Harriett Sine   Number of children: 1   Years of education: Not on file   Highest education level: 12th grade  Occupational History   Not on file   Tobacco Use   Smoking status: Former    Packs/day: 1.00    Years: 25.00    Additional pack years: 0.00    Total pack years: 25.00    Types: Cigarettes    Quit date: 08/15/1988    Years since quitting: 34.4    Passive exposure: Past   Smokeless tobacco: Never  Vaping Use   Vaping Use: Never used  Substance and Sexual Activity   Alcohol use: No    Alcohol/week: 0.0 standard drinks of alcohol   Drug use: No   Sexual activity: Yes  Other Topics Concern   Not on file  Social History Narrative   Married, wife Harriett Sine, for 25 years in 2016. 1 daughter and 3 grandkids from first marriage.    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    Diet: good water, fruits/vegetables daily      OSA eval recommended while hospitalized with Covid 05/2019   Social Determinants of Health   Financial Resource Strain: Low Risk  (01/02/2023)   Overall Financial Resource Strain (CARDIA)    Difficulty of Paying Living Expenses: Not hard at all  Food Insecurity: No Food Insecurity (01/13/2023)   Hunger Vital Sign    Worried About Running Out of Food in the Last Year: Never true    Ran Out of Food in the Last Year: Never true  Transportation Needs: No Transportation Needs (01/13/2023)   PRAPARE - Administrator, Civil Service (Medical): No    Lack of Transportation (Non-Medical): No  Physical Activity: Unknown (01/02/2023)   Exercise Vital Sign    Days of Exercise per Week: Patient declined    Minutes of Exercise per Session: 30 min  Stress: No Stress Concern Present (01/02/2023)   [employer] of Occupational Health - Occupational Stress Questionnaire    Feeling of Stress : Not at all  Social Connections: Socially Integrated (01/02/2023)   Social Connection and Isolation Panel [  NHANES]    Frequency of Communication with Friends and Family: More than  three times a week    Frequency of Social Gatherings with Friends and Family: More than three times a week    Attends Religious Services: 1 to 4 times per year    Active Member of [employer] or Organizations: Yes    Attends Engineer, structural: More than 4 times per year    Marital Status: Married     Family History: The patient's family history includes Cancer in his sister and another family member; Diabetes in his brother and mother; Heart attack in his mother; Heart disease in his father; Polycystic kidney disease in his father and sister; Stroke in his father and mother.  ROS:   Please see the history of present illness.     All other systems reviewed and are negative.  EKGs/Labs/Other Studies Reviewed:    The following studies were reviewed today:  Cardiac catheterization 01/13/23   Ost LM to LM lesion is 50% stenosed.   Ost LAD to Prox LAD lesion is 100% stenosed.   Mid LAD lesion is 60% stenosed.   2nd Diag lesion is 50% stenosed.   Prox RCA-1 lesion is 99% stenosed.   Prox RCA-2 lesion is 90% stenosed.   Prox Cx to Mid Cx lesion is 5% stenosed.   Ost Cx to Prox Cx lesion is 40% stenosed.   Mid Cx to Dist Cx lesion is 99% stenosed.   Dist Cx lesion is 50% stenosed.   Dist LAD lesion is 40% stenosed.   A drug-eluting stent was successfully placed using a STENT ONYX FRONTIER 3.0X18.   Post intervention, there is a 10% residual stenosis.   LIMA and is moderate in size.   and is anatomically normal.   The graft exhibits no disease.   There is moderate left ventricular systolic dysfunction.   LV end diastolic pressure is moderately elevated.   The left ventricular ejection fraction is 35-45% by visual estimate.   1.  Left dominant coronary arteries with severe three-vessel coronary artery disease.  Patent grafts including LIMA to LAD and SVG to first and second diagonals.  Patent mid left circumflex stent.  However, there is subtotal occlusion of the mid to distal left  circumflex with collaterals from the LAD. 2.  Moderately reduced LV systolic function. 3.  Right heart catheterization showed mildly elevated RA pressure, moderate pulmonary hypertension, moderately to severely elevated wedge pressure and normal cardiac output. 4.  Difficult but successful angioplasty and drug-eluting stent placement to the mid/distal left circumflex.  The procedure was difficult due to calcifications and subtotal occlusion with difficulty crossing.   Recommendations: Dual antiplatelet therapy for at least 6 months and preferably longer. Aggressive treatment of risk factors. I increased the dose of oral furosemide to 40 mg twice daily which will be continued while hospitalized and then likely decreased to 40 mg once daily upon discharge.  He was only taking 20 mg daily of furosemide.   Coronary Diagrams   Diagnostic Dominance: Left  Intervention     Myoview lexiscan 12/2022    Abnormal pharmacologic myocardial perfusion stress test.   There is a moderate in size, severe, partially reversible basal and mid inferior/inferolateral defect consistent with ischemia and an element of scar.   Left ventricular systolic function is severely reduced (LVEF 25-35%).   Post-CABG findings, coronary artery calcification, and aortic atherosclerosis are noted on the attenuation correction CT.   1.4 cm hypodensity is noted in the  liver, similar to the prior abdominal CT from 10/04/2021.   Compared with the prior study from 04/22/2021, inferior/inferolateral defect and reduced LVEF are new.   This is a high-risk study.  Echo 11/2022 1. Left ventricular ejection fraction, by estimation, is 40 to 45%. Left  ventricular ejection fraction by 3D volume is 42 %. The left ventricle has  mildly decreased function. The left ventricle demonstrates global  hypokinesis. Left ventricular diastolic   parameters are consistent with Grade I diastolic dysfunction (impaired  relaxation). The average left  ventricular global longitudinal strain is  -10.2 %.   2. Right ventricular systolic function is normal. The right ventricular  size is normal. There is mildly elevated pulmonary artery systolic  pressure. The estimated right ventricular systolic pressure is 39.8 mmHg.   3. Left atrial size was mildly dilated.   4. The mitral valve is normal in structure. Mild mitral valve  regurgitation. No evidence of mitral stenosis.   5. The aortic valve is normal in structure. Aortic valve regurgitation is  not visualized. Mild aortic valve stenosis. Aortic valve mean gradient  measures 12.0 mmHg.   6. The inferior vena cava is normal in size with greater than 50%  respiratory variability, suggesting right atrial pressure of 3 mmHg.   Comparison(s): 03/23/21 50-55%, Mild LVH, RVSP 45.   EKG:  EKG is ordered today.  The ekg ordered today demonstrates NSR, 89bpm, LAD, LBBB, nonspecific T wave changes  Recent Labs: 10/07/2022: ALT 14 01/27/2023: BUN 45; Creatinine, Ser 1.65; Hemoglobin 13.8; Platelets 182; Potassium 4.2; Sodium 138  Recent Lipid Panel    Component Value Date/Time   CHOL 105 10/07/2022 1150   TRIG 73.0 10/07/2022 1150   HDL 41.30 10/07/2022 1150   CHOLHDL 3 10/07/2022 1150   VLDL 14.6 10/07/2022 1150   LDLCALC 49 10/07/2022 1150   LDLDIRECT 106.0 01/01/2016 0844   Physical Exam:    VS:  BP 120/60 (BP Location: Left Arm, Patient Position: Sitting, Cuff Size: Normal)   Pulse 89   Ht 6' (1.829 m)   Wt 207 lb (93.9 kg)   SpO2 95%   BMI 28.07 kg/m     Wt Readings from Last 3 Encounters:  01/27/23 207 lb (93.9 kg)  01/23/23 208 lb (94.3 kg)  01/18/23 200 lb (90.7 kg)     GEN:  Well nourished, well developed in no acute distress HEENT: Normal NECK: No JVD; No carotid bruits LYMPHATICS: No lymphadenopathy CARDIAC: RRR, no murmurs, rubs, gallops RESPIRATORY:  Clear to auscultation without rales, wheezing or rhonchi  ABDOMEN: Soft, non-tender, non-distended MUSCULOSKELETAL:   No edema; No deformity  SKIN: Warm and dry NEUROLOGIC:  Alert and oriented x 3 PSYCHIATRIC:  Normal affect   ASSESSMENT:    1. Coronary artery disease involving native coronary artery of native heart with unstable angina pectoris (HCC)   2. Retinal artery occlusion   3. Chronic systolic heart failure (HCC)   4. Ischemic cardiomyopathy   5. Essential hypertension   6. Hyperlipidemia, mixed   7. PAD (peripheral artery disease) (HCC)   8. Carotid artery disease, unspecified laterality, unspecified type (HCC)    PLAN:    In order of problems listed above:  CAD s/p CABG and recent PCI Recent stenting to the mid/distal left circumflex. RHC showed mildly elevated RA pressure, moderate pulmonary HTN. He was started on ASA and Plavix for at least 6 months. Patient is overall doing well since being home. He does have bruising on his forearms that is diffuse.  Says he has been wood working at home, but plans to take a break. He is taking ASA and Plavix. I will check a CBC.  Cath sites on the left arm are stable. Patient planning to start cardiac rehab. Continue Aspirin, Plavix, Lipitor, Coreg, Zetia, Imdur, Losartan and Ranexa.   HFrEF ICM RHC showed moderate to severely elevated wedge pressure, lasix was increased. Echo showed mildly reduced EF 40-45%. The patient has been taking lasix 40mg  BID. He is euvolemic on exam. We will decrease lasix to 40mg  daily. BMET today. Continue Coreg and Losartan. We will continue GDMT as able.   HTN BP is good today, continue current medications  HLD LDL 49 in 09/2022. Continue Lipitor 20mg  daily and Zetia.   PAD This is followed by Dr. Kirke Corin. Continue aspirin, statin and Zetia.   Carotid artery disease Less than 50% bilateral ICA stenosis on imaging 11/2022  Left retinal artery occlusion I will order a 2 week heart monitor  Disposition: Follow up in 1 month(s) with MD/APP    Signed, Breydan Shillingburg David Stall, PA-C  01/27/2023 9:32 AM    [facility]

## 2023-01-27 ENCOUNTER — Other Ambulatory Visit: Payer: Self-pay

## 2023-01-27 ENCOUNTER — Ambulatory Visit: Payer: HMO | Attending: Medical | Admitting: Medical

## 2023-01-27 ENCOUNTER — Encounter: Payer: Self-pay | Admitting: Medical

## 2023-01-27 ENCOUNTER — Ambulatory Visit (INDEPENDENT_AMBULATORY_CARE_PROVIDER_SITE_OTHER): Payer: HMO

## 2023-01-27 ENCOUNTER — Other Ambulatory Visit
Admission: RE | Admit: 2023-01-27 | Discharge: 2023-01-27 | Disposition: A | Discharge status: Home / Self Care | Payer: HMO | Source: Ambulatory Visit | Attending: Medical | Admitting: Medical

## 2023-01-27 VITALS — BP 120/60 | HR 89 | Ht 72.0 in | Wt 207.0 lb

## 2023-01-27 DIAGNOSIS — I779 Disorder of arteries and arterioles, unspecified: Secondary | ICD-10-CM | POA: Diagnosis not present

## 2023-01-27 DIAGNOSIS — E782 Mixed hyperlipidemia: Secondary | ICD-10-CM

## 2023-01-27 DIAGNOSIS — I10 Essential (primary) hypertension: Secondary | ICD-10-CM | POA: Diagnosis not present

## 2023-01-27 DIAGNOSIS — I5022 Chronic systolic (congestive) heart failure: Secondary | ICD-10-CM

## 2023-01-27 DIAGNOSIS — H349 Unspecified retinal vascular occlusion: Secondary | ICD-10-CM | POA: Diagnosis not present

## 2023-01-27 DIAGNOSIS — I25110 Atherosclerotic heart disease of native coronary artery with unstable angina pectoris: Secondary | ICD-10-CM

## 2023-01-27 DIAGNOSIS — I739 Peripheral vascular disease, unspecified: Secondary | ICD-10-CM

## 2023-01-27 DIAGNOSIS — I255 Ischemic cardiomyopathy: Secondary | ICD-10-CM | POA: Diagnosis not present

## 2023-01-27 DIAGNOSIS — Z79899 Other long term (current) drug therapy: Secondary | ICD-10-CM

## 2023-01-27 LAB — CBC
HCT: 40.1 % (ref 39.0–52.0)
Hemoglobin: 13.8 g/dL (ref 13.0–17.0)
MCH: 32.9 pg (ref 26.0–34.0)
MCHC: 34.4 g/dL (ref 30.0–36.0)
MCV: 95.5 fL (ref 80.0–100.0)
Platelets: 182 10*3/uL (ref 150–400)
RBC: 4.2 MIL/uL — ABNORMAL LOW (ref 4.22–5.81)
RDW: 12 % (ref 11.5–15.5)
WBC: 7.6 10*3/uL (ref 4.0–10.5)
nRBC: 0 % (ref 0.0–0.2)

## 2023-01-27 LAB — BASIC METABOLIC PANEL
Anion gap: 11 (ref 5–15)
BUN: 45 mg/dL — ABNORMAL HIGH (ref 8–23)
CO2: 27 mmol/L (ref 22–32)
Calcium: 9.4 mg/dL (ref 8.9–10.3)
Chloride: 100 mmol/L (ref 98–111)
Creatinine, Ser: 1.65 mg/dL — ABNORMAL HIGH (ref 0.61–1.24)
GFR, Estimated: 41 mL/min — ABNORMAL LOW (ref >60)
Glucose, Bld: 138 mg/dL — ABNORMAL HIGH (ref 70–99)
Potassium: 4.2 mmol/L (ref 3.5–5.1)
Sodium: 138 mmol/L (ref 135–145)

## 2023-01-27 NOTE — Patient Instructions (Addendum)
Medication Instructions:  Your physician recommends that you continue on your current medications as directed. Please refer to the Current Medication list given to you today.   *If you need a refill on your cardiac medications before your next appointment, please call your pharmacy*   Lab Work: Your provider would like for you to have following labs drawn: BMP, CBC.   Please go to the [facility] entrance and check in at the front desk.  You do not need an appointment.  They are open from 7am-6 pm.   If you have labs (blood work) drawn today and your tests are completely normal, you will receive your results only by: MyChart Message (if you have MyChart) OR A paper copy in the mail If you have any lab test that is abnormal or we need to change your treatment, we will call you to review the results.   Testing/Procedures:  Your physician has recommended that you wear a Zio monitor.   This monitor is a medical device that records the heart's electrical activity. Doctors most often use these monitors to diagnose arrhythmias. Arrhythmias are problems with the speed or rhythm of the heartbeat. The monitor is a small device applied to your chest. You can wear one while you do your normal daily activities. While wearing this monitor if you have any symptoms to push the button and record what you felt. Once you have worn this monitor for the period of time provider prescribed (Usually 14 days), you will return the monitor device in the postage paid box. Once it is returned they will download the data collected and provide [place] with a report which the provider will then review and we will call you with those results. Important tips:  Avoid showering during the first 24 hours of wearing the monitor. Avoid excessive sweating to help maximize wear time. Do not submerge the device, no hot tubs, and no swimming pools. Keep any lotions or oils away from the patch. After 24 hours you may shower with  the patch on. Take brief showers with your back facing the shower head.  Do not remove patch once it has been placed because that will interrupt data and decrease adhesive wear time. Push the button when you have any symptoms and write down what you were feeling. Once you have completed wearing your monitor, remove and place into box which has postage paid and place in your outgoing mailbox.  If for some reason you have misplaced your box then call our office and we can provide another box and/or mail it off for you.     Follow-Up: At [facility], you and your health needs are our priority.  As part of our continuing mission to provide you with exceptional heart care, we have created designated Provider Care Teams.  These Care Teams include your primary Cardiologist (physician) and Advanced Practice Providers (APPs -  Physician Assistants and Nurse Practitioners) who all work together to provide you with the care you need, when you need it.  We recommend signing up for the patient portal called "MyChart".  Sign up information is provided on this After Visit Summary.  MyChart is used to connect with patients for Virtual Visits (Telemedicine).  Patients are able to view lab/test results, encounter notes, upcoming appointments, etc.  Non-urgent messages can be sent to your provider as well.   To learn more about what you can do with MyChart, go to [email or web address].    Your next appointment:  Please keep your appointment on 02/14/2023 at 9:00 AM with Dr. Mariah Milling  Provider:   Julien Nordmann, MD

## 2023-02-02 ENCOUNTER — Encounter: Payer: HMO | Admitting: Pharmacist

## 2023-02-02 VITALS — Ht 73.0 in | Wt 210.3 lb

## 2023-02-02 DIAGNOSIS — Z955 Presence of coronary angioplasty implant and graft: Secondary | ICD-10-CM

## 2023-02-02 NOTE — Progress Notes (Signed)
[name] came to rehab today for his and Gym Orientation. He explained that he has been having ankle pain due to twisting his ankle and is not able to walk long distances at this time. We did not administer the 6-minute walk test today due to his ankle pain. We did go over guidelines for the program. We will perform his when patient's ankle is healed and he comes for his first rehab session.

## 2023-02-03 ENCOUNTER — Ambulatory Visit (INDEPENDENT_AMBULATORY_CARE_PROVIDER_SITE_OTHER): Payer: HMO | Admitting: Family Medicine

## 2023-02-03 ENCOUNTER — Ambulatory Visit (INDEPENDENT_AMBULATORY_CARE_PROVIDER_SITE_OTHER)
Admission: RE | Admit: 2023-02-03 | Discharge: 2023-02-03 | Disposition: A | Discharge status: Home / Self Care | Payer: HMO | Source: Ambulatory Visit | Attending: Family Medicine | Admitting: Family Medicine

## 2023-02-03 ENCOUNTER — Encounter: Payer: Self-pay | Admitting: Family Medicine

## 2023-02-03 VITALS — BP 130/76 | HR 70 | Temp 97.3°F | Ht 73.0 in | Wt 213.1 lb

## 2023-02-03 DIAGNOSIS — E1169 Type 2 diabetes mellitus with other specified complication: Secondary | ICD-10-CM | POA: Diagnosis not present

## 2023-02-03 DIAGNOSIS — S99912A Unspecified injury of left ankle, initial encounter: Secondary | ICD-10-CM | POA: Diagnosis not present

## 2023-02-03 DIAGNOSIS — M25572 Pain in left ankle and joints of left foot: Secondary | ICD-10-CM | POA: Diagnosis not present

## 2023-02-03 LAB — POCT GLYCOSYLATED HEMOGLOBIN (HGB A1C): Hemoglobin A1C: 6.3 % — AB (ref 4.0–5.6)

## 2023-02-03 NOTE — Assessment & Plan Note (Signed)
Reviewed diet controlled DM diagnosis - Update A1c per pt request.

## 2023-02-03 NOTE — Patient Instructions (Addendum)
Sugars remain in diet controlled diabetes range.  A1c today.  Xray of left ankle today Elevate legs, use ankle brace for support/stability, may take tylenol for discomfort.

## 2023-02-03 NOTE — Assessment & Plan Note (Signed)
Likely twisted ankle during recent trip to [place].  Recommend supportive measures of leg elevation, brace use, tylenol for pain.  Given medial malleolar pain, check films r/o fracture.  Update if not improving with treatment.

## 2023-02-03 NOTE — Progress Notes (Signed)
Ph: [phone] Fax: [phone]   Patient ID: [name], male    DOB: 1940-09-05, 82 y.o.   MRN: [id number]  This visit was conducted in person.  BP 130/76   Pulse 70   Temp (!) 97.3 F (36.3 C) (Temporal)   Ht 6\' 1"  (1.854 m)   Wt 213 lb 2 oz (96.7 kg)   SpO2 95%   BMI 28.12 kg/m    CC: L ankle pain/swelling, bruising Subjective:   HPI: [name] is a 82 y.o. male presenting on 02/03/2023 for Ankle Pain (C/o L ankle pain, swelling and bruising. Noticed 02/01/23. Thinks due to extensive walking during recent trip. Also, c/o recent nosebleed. )   2d h/o L ankle pain swelling and bruising.  Recent trip to [place] on Tuesday - walked 1+ miles. He did twist ankle towards end of trip.  Subsequently developed bruising, swelling, pain.  He is on DAPT aspirin + plavix.   Recent heart catheterization s/p angioplasty and stent 01/13/2023 - now on aspirin + plavix.  He notes improvement since stent placed. He notes left leg swelling has almost resolved.  Started cardiac rehab yesterday - no significant activity at that time.   Lab Results  Component Value Date   HGBA1C 6.1 (A) 10/07/2022        Relevant past medical, surgical, family and social history reviewed and updated as indicated. Interim medical history since our last visit reviewed. Allergies and medications reviewed and updated. Outpatient Medications Prior to Visit  Medication Sig Dispense Refill   albuterol (PROVENTIL) (2.5 MG/3ML) 0.083% nebulizer solution Take 3 mLs (2.5 mg total) by nebulization every 6 (six) hours as needed for wheezing or shortness of breath. 75 mL 12   albuterol (VENTOLIN HFA) 108 (90 Base) MCG/ACT inhaler TAKE 2 PUFFS INTO LUNGS EVERY 6 HOURS ASNEEDED FOR WHEEZING OR SHORTNESS OF BREATH 8.5 g 6   aspirin EC 81 MG EC tablet Take 1 tablet (81 mg total) by mouth daily. 30 tablet 0   atorvastatin (LIPITOR) 20 MG tablet TAKE 1 TABLET BY MOUTH DAILY 90 tablet 1   carvedilol  (COREG) 3.125 MG tablet Take 1 tablet (3.125 mg total) by mouth 2 (two) times daily. 180 tablet 3   clopidogrel (PLAVIX) 75 MG tablet Take 1 tablet (75 mg total) by mouth daily with breakfast. 90 tablet 3   cyclobenzaprine (FLEXERIL) 5 MG tablet Take 1 tablet (5 mg total) by mouth 3 (three) times daily as needed for muscle spasms. 30 tablet 0   docusate sodium (COLACE) 100 MG capsule Take 1 capsule (100 mg total) by mouth 2 (two) times daily. 30 capsule 0   ezetimibe (ZETIA) 10 MG tablet Take 1 tablet (10 mg total) by mouth daily. 90 tablet 3   famotidine (PEPCID) 20 MG tablet Take 20 mg by mouth daily.     fluticasone (FLONASE) 50 MCG/ACT nasal spray Place 1 spray into both nostrils daily as needed for allergies or rhinitis.     Fluticasone-Umeclidin-Vilant (TRELEGY ELLIPTA) 200-62.5-25 MCG/ACT AEPB Inhale 1 puff into the lungs daily. 180 each 0   Fluticasone-Umeclidin-Vilant (TRELEGY ELLIPTA) 200-62.5-25 MCG/ACT AEPB Inhale 1 puff into the lungs daily. 14 each 0   furosemide (LASIX) 40 MG tablet Take 40 mg by mouth daily.     gabapentin (NEURONTIN) 600 MG tablet TAKE ONE TABLET BY MOUTH 3 TIMES DAILY 270 tablet 1   guaiFENesin-codeine (ROBITUSSIN AC) 100-10 MG/5ML syrup Take 5 mLs by mouth 3 (three) times daily as needed  for cough. 120 mL 0   HYDROcodone-acetaminophen (NORCO/VICODIN) 5-325 MG tablet Take 1-2 tablets by mouth every 4 (four) hours as needed for moderate pain. 30 tablet 0   isosorbide mononitrate (IMDUR) 30 MG 24 hr tablet Take 1 tablet (30 mg total) by mouth daily. 90 tablet 3   losartan (COZAAR) 25 MG tablet TAKE 1 TABLET BY MOUTH DAILY 90 tablet 0   mupirocin ointment (BACTROBAN) 2 % Apply 1 Application topically 2 (two) times daily. 22 g 0   nitroGLYCERIN (NITROSTAT) 0.4 MG SL tablet Place 1 tablet (0.4 mg total) under the tongue every 5 (five) minutes as needed for chest pain. 25 tablet 3   polyethylene glycol (MIRALAX / GLYCOLAX) 17 g packet Take 17 g by mouth daily as needed  for mild constipation.      ranolazine (RANEXA) 500 MG 12 hr tablet TAKE ONE (1) TABLET BY MOUTH TWO TIMES PER DAY 180 tablet 3   silodosin (RAPAFLO) 8 MG CAPS capsule TAKE ONE CAPSULE BY MOUTH DAILY WITH BREAKFAST. 30 capsule 0   Vibegron (GEMTESA) 75 MG TABS Take 1 tablet (75 mg total) by mouth daily. 42 tablet 0   No facility-administered medications prior to visit.     Per HPI unless specifically indicated in ROS section below Review of Systems  Objective:  BP 130/76   Pulse 70   Temp (!) 97.3 F (36.3 C) (Temporal)   Ht 6\' 1"  (1.854 m)   Wt 213 lb 2 oz (96.7 kg)   SpO2 95%   BMI 28.12 kg/m   Wt Readings from Last 3 Encounters:  02/03/23 213 lb 2 oz (96.7 kg)  02/02/23 210 lb 4.8 oz (95.4 kg)  01/27/23 207 lb (93.9 kg)      Physical Exam Vitals and nursing note reviewed.  Constitutional:      Appearance: Normal appearance. He is not ill-appearing.  Musculoskeletal:        General: Swelling, tenderness and signs of injury present.     Right lower leg: No edema.     Left lower leg: Edema (ankle) present.     Comments:  Diminished pedal pulses on left Tender to palpation medial > lateral ankle at malleoli and inferiorly, no ligament laxity No pain at navicular or base of 5th MT.  No pain with calc squeeze or at achilles  No pain with squeeze test to ankle  Skin:    General: Skin is warm and dry.     Findings: Bruising (inferior to bilat malleoli to left ankle) present. No rash.  Neurological:     Mental Status: He is alert.  Psychiatric:        Mood and Affect: Mood normal.        Behavior: Behavior normal.       Results for orders placed or performed during the hospital encounter of 01/27/23  CBC  Result Value Ref Range   WBC 7.6 4.0 - 10.5 K/uL   RBC 4.20 (L) 4.22 - 5.81 MIL/uL   Hemoglobin 13.8 13.0 - 17.0 g/dL   HCT 16.1 09.6 - 04.5 %   MCV 95.5 80.0 - 100.0 fL   MCH 32.9 26.0 - 34.0 pg   MCHC 34.4 30.0 - 36.0 g/dL   RDW 40.9 81.1 - 91.4 %    Platelets 182 150 - 400 K/uL   nRBC 0.0 0.0 - 0.2 %  Basic metabolic panel  Result Value Ref Range   Sodium 138 135 - 145 mmol/L   Potassium 4.2 3.5 -  5.1 mmol/L   Chloride 100 98 - 111 mmol/L   CO2 27 22 - 32 mmol/L   Glucose, Bld 138 (H) 70 - 99 mg/dL   BUN 45 (H) 8 - 23 mg/dL   Creatinine, Ser 1.61 (H) 0.61 - 1.24 mg/dL   Calcium 9.4 8.9 - 09.6 mg/dL   GFR, Estimated 41 (L) >60 mL/min   Anion gap 11 5 - 15   *Note: Due to a large number of results and/or encounters for the requested time period, some results have not been displayed. A complete set of results can be found in Results Review.    Assessment & Plan:   Problem List Items Addressed This Visit     Type 2 diabetes mellitus with other specified complication (HCC)    Reviewed diet controlled DM diagnosis - Update A1c per pt request.       Relevant Orders   POCT glycosylated hemoglobin (Hb A1C)   Injury of left ankle - Primary    Likely twisted ankle during recent trip to [place].  Recommend supportive measures of leg elevation, brace use, tylenol for pain.  Given medial malleolar pain, check films r/o fracture.  Update if not improving with treatment.       Relevant Orders   DG Ankle Complete Left     No orders of the defined types were placed in this encounter.   Orders Placed This Encounter  Procedures   DG Ankle Complete Left    Standing Status:   Future    Standing Expiration Date:   02/03/2024    Order Specific Question:   Reason for Exam (SYMPTOM  OR DIAGNOSIS REQUIRED)    Answer:   L medial ankle pain after twisting ankle with prolonged walk 02/01/2023    Order Specific Question:   Preferred imaging location?    Answer:   [facility]   POCT glycosylated hemoglobin (Hb A1C)    Patient Instructions  Sugars remain in diet controlled diabetes range.  A1c today.  Xray of left ankle today Elevate legs, use ankle brace for support/stability, may take tylenol for discomfort.    Follow up  plan: Return if symptoms worsen or fail to improve.  Eustaquio Boyden, MD

## 2023-02-04 DIAGNOSIS — H34232 Retinal artery branch occlusion, left eye: Secondary | ICD-10-CM | POA: Diagnosis not present

## 2023-02-06 DIAGNOSIS — I10 Essential (primary) hypertension: Secondary | ICD-10-CM | POA: Diagnosis not present

## 2023-02-06 DIAGNOSIS — G4733 Obstructive sleep apnea (adult) (pediatric): Secondary | ICD-10-CM | POA: Diagnosis not present

## 2023-02-07 ENCOUNTER — Ambulatory Visit: Payer: HMO | Admitting: Physician Assistant

## 2023-02-07 ENCOUNTER — Telehealth: Payer: Self-pay | Admitting: Cardiovascular Disease

## 2023-02-07 ENCOUNTER — Encounter: Payer: Self-pay | Admitting: Pulmonary Disease

## 2023-02-07 NOTE — Telephone Encounter (Signed)
Returned the call to the patient. He stated that he has not gained three pounds overnight but gains a few during the day and then loses it overnight. His shortness of breath has been relieved since his cath. He just got back from vacation and stated that he was able to walk around without any complications.   He still does not like the bruising he has from the Plavix and Aspirin but has a follow up with Dr. Mariah Milling on 7/2 and will discuss this then.  He did hurt his ankle while on vacation and PCP ordered an xray. He is waiting on the results.   He will call back before his appointment on 7/2 if anything is needed.

## 2023-02-07 NOTE — Telephone Encounter (Signed)
Please call patient to discuss Pt c/o swelling: STAT is pt has developed SOB within 24 hours  If swelling, where is the swelling located? left foot and is black and blue due to the Plavix  How much weight have you gained and in what time span? Patient states he is up 3 pounds in 1 day  Have you gained 3 pounds in a day or 5 pounds in a week? yes  Do you have a log of your daily weights (if so, list)? Starting weight 207 then 210  Are you currently taking a fluid pill? Yes, Lasix 40 mg  Are you currently SOB? No not since stent was placed  Have you traveled recently? Yes, in [place] for 3-4 days on 6/15

## 2023-02-08 ENCOUNTER — Ambulatory Visit: Payer: HMO | Admitting: Urology

## 2023-02-08 ENCOUNTER — Ambulatory Visit (INDEPENDENT_AMBULATORY_CARE_PROVIDER_SITE_OTHER): Payer: HMO | Admitting: Physician Assistant

## 2023-02-08 VITALS — BP 119/64 | HR 82

## 2023-02-08 DIAGNOSIS — C679 Malignant neoplasm of bladder, unspecified: Secondary | ICD-10-CM | POA: Diagnosis not present

## 2023-02-08 DIAGNOSIS — D494 Neoplasm of unspecified behavior of bladder: Secondary | ICD-10-CM | POA: Diagnosis not present

## 2023-02-08 DIAGNOSIS — N3941 Urge incontinence: Secondary | ICD-10-CM | POA: Diagnosis not present

## 2023-02-08 LAB — MICROSCOPIC EXAMINATION

## 2023-02-08 LAB — URINALYSIS, COMPLETE
Bilirubin, UA: NEGATIVE
Glucose, UA: NEGATIVE
Ketones, UA: NEGATIVE
Nitrite, UA: NEGATIVE
Protein,UA: NEGATIVE
RBC, UA: NEGATIVE
Specific Gravity, UA: 1.01 (ref 1.005–1.030)
Urobilinogen, Ur: 0.2 mg/dL (ref 0.2–1.0)
pH, UA: 7 (ref 5.0–7.5)

## 2023-02-08 MED ORDER — DOXYCYCLINE HYCLATE 100 MG PO TABS: 100.0000 mg | ORAL_TABLET | Freq: Two times a day (BID) | ORAL | 0 refills | Status: DC

## 2023-02-08 MED ORDER — BCG LIVE 50 MG IS SUSR
3.2400 mL | Freq: Once | INTRAVESICAL | Status: DC
Start: 2023-02-08 — End: 2023-02-08

## 2023-02-08 MED ORDER — BCG LIVE 50 MG IS SUSR
3.2400 mL | Freq: Once | INTRAVESICAL | Status: AC
Start: 2023-02-08 — End: 2023-02-08
  Administered 2023-02-08: 81 mg via INTRAVESICAL

## 2023-02-08 NOTE — Telephone Encounter (Signed)
Spoke to patient via telephone and relayed below message/recommendations. He denies chest pain or discomfort. It's more of a burning sensation. He has a stent placed previously and currently receiving cancer treatments.   Will route to Dr. Jayme Cloud for additional recommendations. Message sent to Dr. Belia Heman in error.

## 2023-02-08 NOTE — Telephone Encounter (Signed)
Spoke to patient via telephone and relayed below message/recommendations.  Doxy sent to preferred pharmacy. Nothing further needed.

## 2023-02-08 NOTE — Telephone Encounter (Signed)
Dr. Kasa, please advise. Thanks °

## 2023-02-08 NOTE — Progress Notes (Signed)
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 Patient was cleaned and prepped in a sterile fashion with betadine. A 14FR catheter was inserted, urine return was noted , urine was yellow in color.  50ml of reconstituted BCG was instilled into the bladder. The catheter was then removed. Patient tolerated well, no complications were noted  Performed by: Carman Ching, PA-C and Domingo Cocking, CMA  Follow up/ Additional notes: 1 week for BCG #6 of 6

## 2023-02-08 NOTE — Telephone Encounter (Signed)
We can send a prescription for doxycycline 100 mg twice daily.  Make sure that he is not out in the sun with the medication.  If his chest burning persists, he will definitely need to go to the emergency room.  For now it appears that he has a bronchitis.

## 2023-02-10 ENCOUNTER — Other Ambulatory Visit
Admission: RE | Admit: 2023-02-10 | Discharge: 2023-02-10 | Disposition: A | Discharge status: Home / Self Care | Payer: HMO | Source: Ambulatory Visit | Attending: Medical | Admitting: Medical

## 2023-02-10 DIAGNOSIS — Z79899 Other long term (current) drug therapy: Secondary | ICD-10-CM | POA: Insufficient documentation

## 2023-02-10 LAB — BASIC METABOLIC PANEL
Anion gap: 12 (ref 5–15)
BUN: 38 mg/dL — ABNORMAL HIGH (ref 8–23)
CO2: 22 mmol/L (ref 22–32)
Calcium: 8.8 mg/dL — ABNORMAL LOW (ref 8.9–10.3)
Chloride: 102 mmol/L (ref 98–111)
Creatinine, Ser: 1.65 mg/dL — ABNORMAL HIGH (ref 0.61–1.24)
GFR, Estimated: 41 mL/min — ABNORMAL LOW (ref >60)
Glucose, Bld: 143 mg/dL — ABNORMAL HIGH (ref 70–99)
Potassium: 4.4 mmol/L (ref 3.5–5.1)
Sodium: 136 mmol/L (ref 135–145)

## 2023-02-11 DIAGNOSIS — H349 Unspecified retinal vascular occlusion: Secondary | ICD-10-CM | POA: Diagnosis not present

## 2023-02-13 NOTE — Progress Notes (Unsigned)
Cardiology Office Note  Date:  02/14/2023   ID:  [name], DOB 1941/03/08, MRN [id number]  PCP:  Eustaquio Boyden, MD   Chief Complaint  Patient presents with   6 month follow up     Patient was not successful wearing the Zio monitor; "sweats too much." Patient c/o some chest pain/indigestion yesterday. Medications reviewed by the patient verbally.     HPI:  82 year old gentleman with hx of OSA, did not tolerate CPAP mild  bilateral carotid arterial disease  <39% bilaterally 08/2016 hyperlipidemia  coronary artery disease,  bypass in 1990,  stenting to the circumflex in 2002,  cath 06/2015, patent grafts x 2, RCA small vessel diffuse disease DVT in the left lower extremity,  Completed back surgery for spinal stenosis, 06/20/2016 Cervical neck surgery 01/2017, Kathlen Mody  total knee surgery in 02/2017 covid 05/20/2019 EF was 35 to 40% in 06/2019 in setting of covid 10/20 Repeat echo February 2021  ejection fraction 50 to 55% COPD/asthma who presents for routine followup  Of his coronary artery disease, post  Covid  Last seen by myself in clinic May 2024 Seen by one of our providers January 27, 2023  Hx of Left retinal artery occlusion Cadence ordered a 2 week heart monitor Could not wear, sweating too much Would like to return it  Feels well, walked 1 mile in [place]  Concerned as creatinine 1.65 on Lasix 40 twice daily Lasix decreased down to 40 daily creatinine remains 1.6 Prior to that creatinine 1.2 Denies ankle swelling weight today 212 pounds in the office, 2 oh weight at home He does report high fluid intake  Details reviewed cardiac cath 01/13/2023. This showed left dominant coronary arteries with severe three-vessel CAD. Patent grafts including LIMA to LAD, SVG to first and second diagonals, patent mid left circumflex stent. He was noted to have subtotal occlusion of the mid to distal left circumflex with collaterals from LAD. Moderately reduced LV  SF. Right heart cath showed mildly elevated RA pressure, moderate pulmonary hypertension, moderately to severely elevated wedge pressure with normal cardiac output. Patient was treated with DES placement to the mid/distal left circumflex. He was started on dual antiplatelet therapy with aspirin and Plavix   Seen by Dr.Arida for lower extremity PAD in October 2023, medical management recommended  EKG personally reviewed by myself on todays visit EKG Interpretation Date/Time:  Tuesday February 14 2023 08:57:03 EDT Ventricular Rate:  81 PR Interval:  160 QRS Duration:  126 QT Interval:  386 QTC Calculation: 448 R Axis:   -43  Text Interpretation: Normal sinus rhythm Left axis deviation Non-specific intra-ventricular conduction block Minimal voltage criteria for LVH, may be normal variant ( Cornell product ) Nonspecific T wave abnormality When compared with ECG of 14-Jan-2023 06:20, Premature ventricular complexes are no longer Present Confirmed by Julien Nordmann [id number]) on 02/14/2023 9:27:40 AM     History of COPD exacerbation/bronchitis Echo 4/24: EF 40 to 45%,  down from 50 to 55% in 2022  Carotid u/s Mild-to-moderate bilateral carotid bifurcation plaque with Doppler measurements indicative of less than 50% stenosis.   Hx of Bladder cancer, completed 6 treatments  Stress test September 2022 Low risk, probably normal pharmacologic myocardial perfusion stress test.  LE arterial hx, reviewed today 2.  Right lower extremity: Moderately to severely calcified SFA with diffuse disease throughout its course especially in the midsegment and three-vessel runoff below the knee. 3.  Left lower extremity: Borderline significant SFA disease which is moderately to severely calcified and  three-vessel runoff below the knee.  pulmonary appointment sept, 2021  postinflammatory pulmonary issues post Covid COPD poorly compensated Changes to  his inhalers made  CT chest 06/2019 Diffuse multifocal areas  of consolidation and ground-glass opacity throughout the lungs compatible with atypical infection in the setting of known COVID positivity.  Stress test 03/2019 Pharmacological myocardial perfusion imaging study with no significant ischemia Small region mild fixed perfusion defect in the distal anteroseptal and apical region, unable to exclude old scar vs attenuation artifact EF estimated at 38%, septal wall hypokinesis, possibly secondary to CABG/post-operative state.  No EKG changes concerning for ischemia at peak stress or in recovery. Low risk scan  05/2019 COVID , and hospital 10 days treated with remdesivir and steroids, antibodies little bit of leg swelling but he attributed to not taking diuretics   Back in the hospital again for an additional 10-day stay October 28  he started to run another fever up to 101.3 has not had any chest pain or wheezing no headaches. blood pressure has been  running low in the 90s so he stopped using his lisinopril.  His blood pressure after that improved to 130s over 60s  CT chest,  . Diffuse multifocal areas of consolidation and ground-glass opacity throughout the lungs compatible with atypical infection in the setting of known COVID positivity.  LE arterial 08/2018,   Right lower extremity: Moderately to severely calcified SFA with diffuse disease throughout its course especially in the midsegment and three-vessel runoff below the knee.   AAA less than 3 cm, on scan in 2018 reviewed with him Numerous previous episode of chest pain relieved with belching consistent with GI etiology.  Previous cardiac catheterization 07/07/2015            Ost LM to LM lesion, 50% stenosed.            Ost RCA lesion, 80% stenosed.            Prox RCA-1 lesion, 80% stenosed.            Prox RCA-2 lesion, 80% stenosed.            LIMA was injected is moderate in size, and is anatomically normal.            Dist LAD lesion, 60% stenosed.            Ost LAD  to Prox LAD lesion, 100% stenosed.            Mid LAD lesion, 60% stenosed.            The left ventricular systolic function is normal.            SVG was injected is normal in caliber, and is anatomically normal.            2nd Diag lesion, 50% stenosed.    severe RCA disease (small vessel) not amenable to PCI, moderate left main disease, moderate LAD and proximal diagonal disease. 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), CKD (chronic kidney disease), stage III (HCC), Community acquired pneumonia of right lower lobe of lung (06/05/2017), Diverticulosis, Dupuytren's contracture of right hand (10/29/2008), DVT (deep venous  thrombosis) (HCC) (1998), Elevated prostate specific antigen (PSA) (07/09/2008), Emphysema of lung (HCC), GERD (04/30/2007), Heart murmur, History of 2019 novel coronavirus disease (COVID-19) (05/20/2019), History of hiatal hernia, History of shingles, HLD (hyperlipidemia) (04/26/2007), HTN (hypertension) (04/30/2007), Laceration of skin of left hand (03/08/2018), LBBB (left bundle branch block), Lumbar disc disease with radiculopathy, Migraines, Myocardial infarction (HCC) (1990), Myocardial infarction (HCC) (2002), Neuropathy of both feet, OSA on CPAP, Osteoarthritis, PAD (peripheral artery disease) (HCC), Pneumonia (07/2014), Pneumonia due to COVID-19 virus (05/30/2019), Pre-diabetes, Prostatitis, chronic, Right inguinal hernia (05/26/2010), S/P CABG x 3 (1990), Spinal stenosis of lumbar region, and 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  PSH:    Past Surgical  History:  Procedure Laterality Date   ABDOMINAL AORTOGRAM W/LOWER EXTREMITY [name] 09/12/2018   Procedure: ABDOMINAL AORTOGRAM W/LOWER EXTREMITY;  Surgeon: Iran Ouch, MD;  Location: [facility];  Service: Cardiovascular;  Laterality: [name];   ANTERIOR CERVICAL DECOMP/DISCECTOMY FUSION [name] 07/11/2022   Anterior Cervical Decompression Fusion ,Interboy Prothesis,Plate/Screws , Cervical four-five,Cervical five-six [name], Tinnie Gens, MD)   APPENDECTOMY     rupture   BACK SURGERY     1984 and then another one [facility]   BLADDER INSTILLATION [name] 10/19/2021   Procedure: BLADDER INSTILLATION OF GEMCITABINE;  Surgeon: Riki Altes, MD;  Location: [facility];  Service: Urology;  Laterality: [name];   BLADDER INSTILLATION [name] 11/15/2022   Procedure: BLADDER INSTILLATION OF GEMCITABINE;  Surgeon: Riki Altes, MD;  Location: [facility];  Service: Urology;  Laterality: [name];   CARDIAC CATHETERIZATION [name] 07/07/2015   Procedure: Left Heart Cath and Cors/Grafts Angiography;  Surgeon: Antonieta Iba, MD;  Location: [facility];  Service: Cardiovascular;  Laterality: [name];   CATARACT EXTRACTION, BILATERAL  2009   CERVICAL SPINE SURGERY  12/2016   cervical stenosis [name])   COLONOSCOPY  03/2010   HP polyp, diverticulosis, rpt 10 yrs ([name])   CORONARY ANGIOPLASTY  11/30/2000   LCX   CORONARY ARTERY BYPASS GRAFT  1990   3 vessel    CORONARY STENT INTERVENTION [name] 01/13/2023   Procedure: CORONARY STENT INTERVENTION;  Surgeon: Iran Ouch, MD;  Location: [facility];  Service: Cardiovascular;  Laterality: [name];   CYSTOSCOPY  12/23/2010   [facility]   JOINT REPLACEMENT     KNEE ARTHROSCOPY Right    x2   LAMINOTOMY  1986   L5/S1 lumbar laminotomy for two ruptured discs/fusion   LUMBAR LAMINECTOMY/DECOMPRESSION MICRODISCECTOMY [name] 07/13/2016   Procedure: LUMBAR TWO-THREE, LUMBAR THREE-FOUR, LUMBAR FOUR-FIVE LAMINECTOMY AND FORAMINOTOMY;  Surgeon: Tressie Stalker, MD;  Location:  [facility];  Service: Neurosurgery;  Laterality: [name];  LAMINECTOMY AND FORAMINOTOMY L2-L3, L3-L4,L4-L5   LUMBAR SPINE SURGERY  06/2020   Bilateral redo laminectomy/laminotomy/foraminotomies/medial facetectomy to decompress the bilateral L4 and L5 nerve roots ([facility])   RIGHT/LEFT HEART CATH AND CORONARY ANGIOGRAPHY Bilateral 01/13/2023   Procedure: RIGHT/LEFT HEART CATH AND CORONARY ANGIOGRAPHY;  Surgeon: Iran Ouch, MD;  Location: [facility];  Service: Cardiovascular;  Laterality: Bilateral;   TOTAL HIP ARTHROPLASTY Bilateral 1999,2000   TOTAL KNEE ARTHROPLASTY Right 03/06/2017   Procedure: RIGHT TOTAL KNEE ARTHROPLASTY;  Surgeon: Ollen Gross, MD;  Location: [facility];  Service: Orthopedics;  Laterality: Right;   TRANSURETHRAL RESECTION OF BLADDER TUMOR [name] 10/19/2021   Procedure: TRANSURETHRAL RESECTION OF BLADDER TUMOR (TURBT);  Surgeon: Riki Altes, MD;  Location: [facility];  Service: Urology;  Laterality: [name];   TRANSURETHRAL RESECTION OF BLADDER TUMOR [name] 11/15/2022   Procedure: TRANSURETHRAL RESECTION  OF BLADDER TUMOR (TURBT);  Surgeon: Riki Altes, MD;  Location: [facility];  Service: Urology;  Laterality: [name];    Current Outpatient Medications  Medication Sig Dispense Refill   albuterol (PROVENTIL) (2.5 MG/3ML) 0.083% nebulizer solution Take 3 mLs (2.5 mg total) by nebulization every 6 (six) hours as needed for wheezing or shortness of breath. 75 mL 12   albuterol (VENTOLIN HFA) 108 (90 Base) MCG/ACT inhaler TAKE 2 PUFFS INTO LUNGS EVERY 6 HOURS ASNEEDED FOR WHEEZING OR SHORTNESS OF BREATH 8.5 g 6   aspirin EC 81 MG EC tablet Take 1 tablet (81 mg total) by mouth daily. 30 tablet 0   atorvastatin (LIPITOR) 20 MG tablet TAKE 1 TABLET BY MOUTH DAILY 90 tablet 1   carvedilol (COREG) 3.125 MG tablet Take 1 tablet (3.125 mg total) by mouth 2 (two) times daily. 180 tablet 3   clopidogrel (PLAVIX) 75 MG tablet Take 1 tablet (75 mg total) by mouth daily with breakfast. 90 tablet  3   cyclobenzaprine (FLEXERIL) 5 MG tablet Take 1 tablet (5 mg total) by mouth 3 (three) times daily as needed for muscle spasms. 30 tablet 0   docusate sodium (COLACE) 100 MG capsule Take 1 capsule (100 mg total) by mouth 2 (two) times daily. 30 capsule 0   doxycycline (VIBRA-TABS) 100 MG tablet Take 1 tablet (100 mg total) by mouth 2 (two) times daily. 14 tablet 0   ezetimibe (ZETIA) 10 MG tablet Take 1 tablet (10 mg total) by mouth daily. 90 tablet 3   famotidine (PEPCID) 20 MG tablet Take 20 mg by mouth daily.     fluticasone (FLONASE) 50 MCG/ACT nasal spray Place 1 spray into both nostrils daily as needed for allergies or rhinitis.     Fluticasone-Umeclidin-Vilant (TRELEGY ELLIPTA) 200-62.5-25 MCG/ACT AEPB Inhale 1 puff into the lungs daily. 180 each 0   furosemide (LASIX) 40 MG tablet Take 40 mg by mouth daily.     gabapentin (NEURONTIN) 600 MG tablet TAKE ONE TABLET BY MOUTH 3 TIMES DAILY 270 tablet 1   guaiFENesin-codeine (ROBITUSSIN AC) 100-10 MG/5ML syrup Take 5 mLs by mouth 3 (three) times daily as needed for cough. 120 mL 0   HYDROcodone-acetaminophen (NORCO/VICODIN) 5-325 MG tablet Take 1-2 tablets by mouth every 4 (four) hours as needed for moderate pain. 30 tablet 0   isosorbide mononitrate (IMDUR) 30 MG 24 hr tablet Take 1 tablet (30 mg total) by mouth daily. 90 tablet 3   losartan (COZAAR) 25 MG tablet TAKE 1 TABLET BY MOUTH DAILY 90 tablet 0   mupirocin ointment (BACTROBAN) 2 % Apply 1 Application topically 2 (two) times daily. 22 g 0   nitroGLYCERIN (NITROSTAT) 0.4 MG SL tablet Place 1 tablet (0.4 mg total) under the tongue every 5 (five) minutes as needed for chest pain. 25 tablet 3   polyethylene glycol (MIRALAX / GLYCOLAX) 17 g packet Take 17 g by mouth daily as needed for mild constipation.      ranolazine (RANEXA) 500 MG 12 hr tablet TAKE ONE (1) TABLET BY MOUTH TWO TIMES PER DAY 180 tablet 3   silodosin (RAPAFLO) 8 MG CAPS capsule TAKE ONE CAPSULE BY MOUTH DAILY WITH  BREAKFAST. 30 capsule 0   Vibegron (GEMTESA) 75 MG TABS Take 1 tablet (75 mg total) by mouth daily. 42 tablet 0   No current facility-administered medications for this visit.    Allergies:   Vioxx [rofecoxib], Spiriva respimat [tiotropium bromide monohydrate], Contrast media [iodinated contrast media], and Morphine  Social History:  The patient  reports that he quit smoking about 34 years ago. His smoking use included cigarettes. He has a 25.00 pack-year smoking history. He has been exposed to tobacco smoke. He has never used smokeless tobacco. He reports that he does not drink alcohol and does not use drugs.   Family History:   family history includes Cancer in his sister and another family member; Diabetes in his brother and mother; Heart attack in his mother; Heart disease in his father; Polycystic kidney disease in his father and sister; Stroke in his father and mother.   Review of Systems: Review of Systems  Constitutional: Negative.   HENT: Negative.    Respiratory: Negative.    Cardiovascular: Negative.   Gastrointestinal: Negative.   Musculoskeletal: Negative.   Skin:        Bruising  Neurological: Negative.   Psychiatric/Behavioral: Negative.    All other systems reviewed and are negative.   PHYSICAL EXAM: VS:  BP (!) 144/60 (BP Location: Left Arm, Patient Position: Sitting, Cuff Size: Normal)   Pulse 81   Ht 6' (1.829 m)   Wt 212 lb 4 oz (96.3 kg)   SpO2 94%   BMI 28.79 kg/m  , BMI Body mass index is 28.79 kg/m. Constitutional:  oriented to person, place, and time. No distress.  HENT:  Head: Grossly normal Eyes:  no discharge. No scleral icterus.  Neck: No JVD, no carotid bruits  Cardiovascular: Regular rate and rhythm, no murmurs appreciated Pulmonary/Chest: Clear to auscultation bilaterally, no wheezes or rails Abdominal: Soft.  no distension.  no tenderness.  Musculoskeletal: Normal range of motion Neurological:  normal muscle tone. Coordination normal. No  atrophy Skin: Skin warm and dry Psychiatric: normal affect, pleasant  Recent Labs: 10/07/2022: ALT 14 01/27/2023: Hemoglobin 13.8; Platelets 182 02/10/2023: BUN 38; Creatinine, Ser 1.65; Potassium 4.4; Sodium 136    Lipid Panel Lab Results  Component Value Date   CHOL 105 10/07/2022   HDL 41.30 10/07/2022   LDLCALC 49 10/07/2022   TRIG 73.0 10/07/2022      Wt Readings from Last 3 Encounters:  02/14/23 212 lb 4 oz (96.3 kg)  02/03/23 213 lb 2 oz (96.7 kg)  02/02/23 210 lb 4.8 oz (95.4 kg)     ASSESSMENT AND PLAN:  Cardiomyopathy,  Low ejection fraction in the setting of COVID in 2020 recovered, EF up to 50 -55% by echo 09/2019, same in 2022,  Drop in ejection fraction 40 to 45% in the setting of recent bronchitis Stress test positive, Stent placed to LCX, Breathing better, edema resolved Recommend he continue carvedilol, Lasix 20 daily, isosorbide 30 daily Hold off on ACE, ARB, Entresto in the setting of renal dysfunction  Cad with stable angina Currently with no symptoms of angina. No further workup at this time. Continue current medication regimen. Recent stent LCX, stay on aspirin Plavix  Covid , 05/2019 With associated cardiomyopathy, drop in ejection fraction recovered, now with mild drop again after recent bronchitis  COPD/emphysema Followed by pulmonary Prior smoking history, Covid 2020  Mixed hyperlipidemia Cholesterol is at goal on the current lipid regimen. No changes to the medications were made.  Essential hypertension Systolic mildly elevated, recommend he closely monitor at home  Bilateral carotid artery stenosis Mild bilateral carotid disease less than 39% bilaterally Continue aggressive lipid management  Diet-controlled diabetes mellitus (HCC) Well-controlled  PAD Follows with Dr. Kirke Corin ,  bilateral SFA disease No claudications Medical management has been recommended   Total encounter time more  than 40 minutes  Greater than 50% was spent  in counseling and coordination of care with the patient   Orders Placed This Encounter  Procedures   EKG 12-Lead      Signed, Dossie Arbour, M.D., Ph.D. 02/14/2023  [facility] [place], [place] [phone]

## 2023-02-14 ENCOUNTER — Ambulatory Visit: Payer: HMO | Attending: Cardiovascular Disease | Admitting: Cardiovascular Disease

## 2023-02-14 ENCOUNTER — Encounter: Payer: Self-pay | Admitting: Cardiovascular Disease

## 2023-02-14 VITALS — BP 144/60 | HR 81 | Ht 72.0 in | Wt 212.2 lb

## 2023-02-14 DIAGNOSIS — I779 Disorder of arteries and arterioles, unspecified: Secondary | ICD-10-CM

## 2023-02-14 DIAGNOSIS — Z79899 Other long term (current) drug therapy: Secondary | ICD-10-CM | POA: Diagnosis not present

## 2023-02-14 DIAGNOSIS — E785 Hyperlipidemia, unspecified: Secondary | ICD-10-CM

## 2023-02-14 DIAGNOSIS — I10 Essential (primary) hypertension: Secondary | ICD-10-CM

## 2023-02-14 DIAGNOSIS — I5022 Chronic systolic (congestive) heart failure: Secondary | ICD-10-CM

## 2023-02-14 DIAGNOSIS — I739 Peripheral vascular disease, unspecified: Secondary | ICD-10-CM

## 2023-02-14 DIAGNOSIS — I25118 Atherosclerotic heart disease of native coronary artery with other forms of angina pectoris: Secondary | ICD-10-CM | POA: Diagnosis not present

## 2023-02-14 DIAGNOSIS — E782 Mixed hyperlipidemia: Secondary | ICD-10-CM | POA: Diagnosis not present

## 2023-02-14 DIAGNOSIS — I255 Ischemic cardiomyopathy: Secondary | ICD-10-CM | POA: Diagnosis not present

## 2023-02-14 DIAGNOSIS — H349 Unspecified retinal vascular occlusion: Secondary | ICD-10-CM

## 2023-02-14 MED ORDER — FUROSEMIDE 40 MG PO TABS: 20.0000 mg | ORAL_TABLET | Freq: Every day | ORAL | 3 refills | Status: DC

## 2023-02-14 NOTE — Progress Notes (Unsigned)
BCG Bladder Instillation  BCG # 6/6  Due to Bladder Cancer patient is present today for a BCG treatment. Patient was cleaned and prepped in a sterile fashion with betadine. A 14 FR catheter was inserted, urine return was noted 100 ml, urine was yellow in color.  50ml of reconstituted BCG was instilled into the bladder. The catheter was then removed. Patient tolerated well, no complications were noted  Performed by: Michiel Cowboy, PA-C and Arlene lang, SMA   Follow up/ Additional notes: Follow up with for cysto w/ Dr. Lonna Cobb in 6 weeks

## 2023-02-14 NOTE — Patient Instructions (Addendum)
Medication Instructions:  Please decrease lasix to  20 mg daily  If you need a refill on your cardiac medications before your next appointment, please call your pharmacy.   Lab work:  Your provider would like for you to return in 2 weeks to have the following labs drawn: BMET.   Please go to the [facility] entrance and check in at the front desk.  You do not need an appointment.  They are open from 7am-6 pm.    Testing/Procedures: No new testing needed  Follow-Up: At [facility], you and your health needs are our priority.  As part of our continuing mission to provide you with exceptional heart care, we have created designated Provider Care Teams.  These Care Teams include your primary Cardiologist (physician) and Advanced Practice Providers (APPs -  Physician Assistants and Nurse Practitioners) who all work together to provide you with the care you need, when you need it.  You will need a follow up appointment in 6 months with Eula Listen, PC-C    COVID-19 Vaccine Information can be found at: [email or web address] For questions related to vaccine distribution or appointments, please email [email or web address] or call [phone].

## 2023-02-15 ENCOUNTER — Ambulatory Visit (INDEPENDENT_AMBULATORY_CARE_PROVIDER_SITE_OTHER): Payer: HMO | Admitting: Urology

## 2023-02-15 DIAGNOSIS — C679 Malignant neoplasm of bladder, unspecified: Secondary | ICD-10-CM

## 2023-02-15 LAB — URINALYSIS, COMPLETE
Bilirubin, UA: NEGATIVE
Glucose, UA: NEGATIVE
Ketones, UA: NEGATIVE
Leukocytes,UA: NEGATIVE
Nitrite, UA: NEGATIVE
Protein,UA: NEGATIVE
RBC, UA: NEGATIVE
Specific Gravity, UA: 1.01 (ref 1.005–1.030)
Urobilinogen, Ur: 1 mg/dL (ref 0.2–1.0)
pH, UA: 5.5 (ref 5.0–7.5)

## 2023-02-15 LAB — MICROSCOPIC EXAMINATION: Bacteria, UA: NONE SEEN

## 2023-02-15 MED ORDER — BCG LIVE 50 MG IS SUSR
3.2400 mL | Freq: Once | INTRAVESICAL | Status: AC
Start: 2023-02-15 — End: 2023-02-15
  Administered 2023-02-15: 81 mg via INTRAVESICAL

## 2023-02-20 DIAGNOSIS — M25511 Pain in right shoulder: Secondary | ICD-10-CM | POA: Diagnosis not present

## 2023-02-21 ENCOUNTER — Ambulatory Visit (INDEPENDENT_AMBULATORY_CARE_PROVIDER_SITE_OTHER): Payer: HMO

## 2023-02-21 ENCOUNTER — Ambulatory Visit: Payer: HMO

## 2023-02-21 ENCOUNTER — Encounter: Payer: Self-pay | Admitting: *Deleted

## 2023-02-21 VITALS — Ht 72.0 in | Wt 210.0 lb

## 2023-02-21 DIAGNOSIS — Z0000 Encounter for general adult medical examination without abnormal findings: Secondary | ICD-10-CM | POA: Diagnosis not present

## 2023-02-21 DIAGNOSIS — Z955 Presence of coronary angioplasty implant and graft: Secondary | ICD-10-CM

## 2023-02-21 NOTE — Patient Instructions (Signed)
[name] , Thank you for taking time to come for your Medicare Wellness Visit. I appreciate your ongoing commitment to your health goals. Please review the following plan we discussed and let me know if I can assist you in the future.   These are the goals we discussed:  Goals      Exercise 3x per week (30 min per time)     02/14/2022-Continue to move and stay active.      Follow up with Primary Care Provider     Starting 12/03/18, I will continue to take medications as prescribed and to keep appointments with PCP as scheduled.      Patient Stated     12/09/2019, I will continue to maintain and take medications as prescribed.     Patient Stated     02/11/2021, I will maintain and continue medications as prescribed.      Patient Stated     Be more active, walk more.     Pharmacy Care Plan     CARE PLAN ENTRY (see longitudinal plan of care for additional care plan information)  Current Barriers:  Chronic Disease Management support, education, and care coordination needs related to Hypertension and Cardiomyopathy   Hypertension BP Readings from Last 3 Encounters:  01/14/20 120/62  12/18/19 (!) 144/61  12/09/19 126/64  Pharmacist Clinical Goal(s): Over the next 6 months, patient will work with PharmD and providers to maintain BP goal <140/90 mmHg Current regimen:  Losartan 25 mg - 1 tablet daily Metoprolol succinate 25 mg - 1/2 tablet daily Interventions: Reviewed home blood pressure readings Comprehensive medication review Patient self care activities - Over the next 6 months, patient will: Check blood pressure for 7 days prior to appointments Ensure daily salt intake < 2300 mg/day Call if blood pressure is consistently elevated above 140/90 mmHg  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 Take Lasix if weight gain of 3 or more pounds in 24 hours or 5 pounds in 1 week.   Initial goal documentation        This is a list of the screening recommended for you and due dates:  Health Maintenance  Topic Date Due   COVID-19 Vaccine (5 - 2023-24 season) 04/15/2022   Medicare Annual Wellness Visit  02/15/2023   Yearly kidney health urinalysis for diabetes  02/19/2023   Flu Shot  03/16/2023   Hemoglobin A1C  08/05/2023   Complete foot exam   08/18/2023   Eye exam for diabetics  09/15/2023   Yearly kidney function blood test for diabetes  02/10/2024   DTaP/Tdap/Td vaccine (3 - Td or Tdap) 02/11/2028   Pneumonia Vaccine  Completed   Zoster (Shingles) Vaccine  Completed   HPV Vaccine  Aged Out    Advanced directives: in chart  Conditions/risks identified: Aim for 30 minutes of exercise or brisk walking, 6-8 glasses of water, and 5 servings of fruits and vegetables each day.   Next appointment: Follow up in one year for your annual wellness visit. 02/22/24 @ 10:15 telephone call  Preventive Care 65 Years and Older, Male  Preventive care refers to  lifestyle choices and visits with your health care provider that can promote health and wellness. What does preventive care include? A yearly physical exam. This is also called an annual well check. Dental exams once or twice a year. Routine eye exams. Ask your health care provider how often you should have your eyes checked. Personal lifestyle choices, including: Daily care of your teeth and gums. Regular physical activity. Eating a healthy diet. Avoiding tobacco and drug use. Limiting alcohol use. Practicing safe sex. Taking low doses of aspirin every day. Taking vitamin and mineral supplements  as recommended by your health care provider. What happens during an annual well check? The services and screenings done by your health care provider during your annual well check will depend on your age, overall health, lifestyle risk factors, and family history of disease. Counseling  Your health care provider may ask you questions about your: Alcohol use. Tobacco use. Drug use. Emotional well-being. Home and relationship well-being. Sexual activity. Eating habits. History of falls. Memory and ability to understand (cognition). Work and work Astronomer. Screening  You may have the following tests or measurements: Height, weight, and BMI. Blood pressure. Lipid and cholesterol levels. These may be checked every 5 years, or more frequently if you are over 14 years old. Skin check. Lung cancer screening. You may have this screening every year starting at age 58 if you have a 30-pack-year history of smoking and currently smoke or have quit within the past 15 years. Fecal occult blood test (FOBT) of the stool. You may have this test every year starting at age 79. Flexible sigmoidoscopy or colonoscopy. You may have a sigmoidoscopy every 5 years or a colonoscopy every 10 years starting at age 64. Prostate cancer screening. Recommendations will vary depending on your family history and other risks. Hepatitis C blood test. Hepatitis B blood test. Sexually transmitted disease (STD) testing. Diabetes screening. This is done by checking your blood sugar (glucose) after you have not eaten for a while (fasting). You may have this done every 1-3 years. Abdominal aortic aneurysm (AAA) screening. You may need this if you are a current or former smoker. Osteoporosis. You may be screened starting at age 30 if you are at high risk. Talk with your health care provider about your test results, treatment options, and if necessary, the need for more tests. Vaccines  Your health care provider may recommend  certain vaccines, such as: Influenza vaccine. This is recommended every year. Tetanus, diphtheria, and acellular pertussis (Tdap, Td) vaccine. You may need a Td booster every 10 years. Zoster vaccine. You may need this after age 90. Pneumococcal 13-valent conjugate (PCV13) vaccine. One dose is recommended after age 52. Pneumococcal polysaccharide (PPSV23) vaccine. One dose is recommended after age 6. Talk to your health care provider about which screenings and vaccines you need and how often you need them. This information is not intended to replace advice given to you by your health care provider. Make sure you discuss any questions you have with your health care provider. Document Released: 08/28/2015 Document Revised: 04/20/2016 Document Reviewed: 06/02/2015 Elsevier Interactive Patient Education  2017 [employer].  Fall Prevention in the Home Falls can cause injuries. They can happen to people of all ages. There are many things you can do to make your home safe and to help prevent falls. What can I do on the outside of my home? Regularly fix the edges of walkways and driveways and fix any cracks. Remove anything that might make you trip  as you walk through a door, such as a raised step or threshold. Trim any bushes or trees on the path to your home. Use bright outdoor lighting. Clear any walking paths of anything that might make someone trip, such as rocks or tools. Regularly check to see if handrails are loose or broken. Make sure that both sides of any steps have handrails. Any raised decks and porches should have guardrails on the edges. Have any leaves, snow, or ice cleared regularly. Use sand or salt on walking paths during winter. Clean up any spills in your garage right away. This includes oil or grease spills. What can I do in the bathroom? Use night lights. Install grab bars by the toilet and in the tub and shower. Do not use towel bars as grab bars. Use non-skid mats or  decals in the tub or shower. If you need to sit down in the shower, use a plastic, non-slip stool. Keep the floor dry. Clean up any water that spills on the floor as soon as it happens. Remove soap buildup in the tub or shower regularly. Attach bath mats securely with double-sided non-slip rug tape. Do not have throw rugs and other things on the floor that can make you trip. What can I do in the bedroom? Use night lights. Make sure that you have a light by your bed that is easy to reach. Do not use any sheets or blankets that are too big for your bed. They should not hang down onto the floor. Have a firm chair that has side arms. You can use this for support while you get dressed. Do not have throw rugs and other things on the floor that can make you trip. What can I do in the kitchen? Clean up any spills right away. Avoid walking on wet floors. Keep items that you use a lot in easy-to-reach places. If you need to reach something above you, use a strong step stool that has a grab bar. Keep electrical cords out of the way. Do not use floor polish or wax that makes floors slippery. If you must use wax, use non-skid floor wax. Do not have throw rugs and other things on the floor that can make you trip. What can I do with my stairs? Do not leave any items on the stairs. Make sure that there are handrails on both sides of the stairs and use them. Fix handrails that are broken or loose. Make sure that handrails are as long as the stairways. Check any carpeting to make sure that it is firmly attached to the stairs. Fix any carpet that is loose or worn. Avoid having throw rugs at the top or bottom of the stairs. If you do have throw rugs, attach them to the floor with carpet tape. Make sure that you have a light switch at the top of the stairs and the bottom of the stairs. If you do not have them, ask someone to add them for you. What else can I do to help prevent falls? Wear shoes that: Do not  have high heels. Have rubber bottoms. Are comfortable and fit you well. Are closed at the toe. Do not wear sandals. If you use a stepladder: Make sure that it is fully opened. Do not climb a closed stepladder. Make sure that both sides of the stepladder are locked into place. Ask someone to hold it for you, if possible. Clearly mark and make sure that you can see: Any grab bars or  handrails. First and last steps. Where the edge of each step is. Use tools that help you move around (mobility aids) if they are needed. These include: Canes. Walkers. Scooters. Crutches. Turn on the lights when you go into a dark area. Replace any light bulbs as soon as they burn out. Set up your furniture so you have a clear path. Avoid moving your furniture around. If any of your floors are uneven, fix them. If there are any pets around you, be aware of where they are. Review your medicines with your doctor. Some medicines can make you feel dizzy. This can increase your chance of falling. Ask your doctor what other things that you can do to help prevent falls. This information is not intended to replace advice given to you by your health care provider. Make sure you discuss any questions you have with your health care provider. Document Released: 05/28/2009 Document Revised: 01/07/2016 Document Reviewed: 09/05/2014 Elsevier Interactive Patient Education  2017 [employer].
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
## 2023-02-21 NOTE — Progress Notes (Signed)
Cardiac Individual Treatment Plan  Patient Details  Name: [name] MRN: [id number] Date of Birth: [birth year] Referring Provider:    Initial Encounter Date:   Visit Diagnosis: Status post coronary artery stent placement  Patient's Home Medications on Admission:  Current Outpatient Medications:    albuterol (PROVENTIL) (2.5 MG/3ML) 0.083% nebulizer solution, Take 3 mLs (2.5 mg total) by nebulization every 6 (six) hours as needed for wheezing or shortness of breath., Disp: 75 mL, Rfl: 12   albuterol (VENTOLIN HFA) 108 (90 Base) MCG/ACT inhaler, TAKE 2 PUFFS INTO LUNGS EVERY 6 HOURS ASNEEDED FOR WHEEZING OR SHORTNESS OF BREATH, Disp: 8.5 g, Rfl: 6   aspirin EC 81 MG EC tablet, Take 1 tablet (81 mg total) by mouth daily., Disp: 30 tablet, Rfl: 0   atorvastatin (LIPITOR) 20 MG tablet, TAKE 1 TABLET BY MOUTH DAILY, Disp: 90 tablet, Rfl: 1   carvedilol (COREG) 3.125 MG tablet, Take 1 tablet (3.125 mg total) by mouth 2 (two) times daily., Disp: 180 tablet, Rfl: 3   clopidogrel (PLAVIX) 75 MG tablet, Take 1 tablet (75 mg total) by mouth daily with breakfast., Disp: 90 tablet, Rfl: 3   cyclobenzaprine (FLEXERIL) 5 MG tablet, Take 1 tablet (5 mg total) by mouth 3 (three) times daily as needed for muscle spasms. (Patient not taking: Reported on 02/21/2023), Disp: 30 tablet, Rfl: 0   docusate sodium (COLACE) 100 MG capsule, Take 1 capsule (100 mg total) by mouth 2 (two) times daily., Disp: 30 capsule, Rfl: 0   doxycycline (VIBRA-TABS) 100 MG tablet, Take 1 tablet (100 mg total) by mouth 2 (two) times daily. (Patient not taking: Reported on 02/21/2023), Disp: 14 tablet, Rfl: 0   ezetimibe (ZETIA) 10 MG tablet, Take 1 tablet (10 mg total) by mouth daily., Disp: 90 tablet, Rfl: 3   famotidine (PEPCID) 20 MG tablet, Take 20 mg by mouth daily., Disp: , Rfl:    fluticasone (FLONASE) 50 MCG/ACT nasal spray, Place 1 spray into both nostrils daily as needed for allergies or rhinitis., Disp: , Rfl:     Fluticasone-Umeclidin-Vilant (TRELEGY ELLIPTA) 200-62.5-25 MCG/ACT AEPB, Inhale 1 puff into the lungs daily., Disp: 180 each, Rfl: 0   furosemide (LASIX) 40 MG tablet, Take 0.5 tablets (20 mg total) by mouth daily., Disp: 90 tablet, Rfl: 3   gabapentin (NEURONTIN) 600 MG tablet, TAKE ONE TABLET BY MOUTH 3 TIMES DAILY, Disp: 270 tablet, Rfl: 1   guaiFENesin-codeine (ROBITUSSIN AC) 100-10 MG/5ML syrup, Take 5 mLs by mouth 3 (three) times daily as needed for cough. (Patient not taking: Reported on 02/21/2023), Disp: 120 mL, Rfl: 0   HYDROcodone-acetaminophen (NORCO/VICODIN) 5-325 MG tablet, Take 1-2 tablets by mouth every 4 (four) hours as needed for moderate pain. (Patient not taking: Reported on 02/21/2023), Disp: 30 tablet, Rfl: 0   isosorbide mononitrate (IMDUR) 30 MG 24 hr tablet, Take 1 tablet (30 mg total) by mouth daily., Disp: 90 tablet, Rfl: 3   losartan (COZAAR) 25 MG tablet, TAKE 1 TABLET BY MOUTH DAILY, Disp: 90 tablet, Rfl: 0   mupirocin ointment (BACTROBAN) 2 %, Apply 1 Application topically 2 (two) times daily. (Patient not taking: Reported on 02/21/2023), Disp: 22 g, Rfl: 0   nitroGLYCERIN (NITROSTAT) 0.4 MG SL tablet, Place 1 tablet (0.4 mg total) under the tongue every 5 (five) minutes as needed for chest pain., Disp: 25 tablet, Rfl: 3   polyethylene glycol (MIRALAX / GLYCOLAX) 17 g packet, Take 17 g by mouth daily as needed for mild constipation. 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 06/2019 Echo (in setting of COVID): EF 35-40%, mild LVH, g1 DD, glob  HK; b. 09/2019 Echo: EF 50-55%, Gr1 DD; c. 03/2021 Echo: EF 50-55%, no rwma, mild LVH, GrI DD, nl RV fxn. RVSP 44.44mmHg. Mild-mod dil LA. Triv MR. Mild-mod Ao sclerosis.   Actinic keratosis    Anginal pain (HCC)    Aortic ectasia, abdominal (HCC)    a.) CT abd 01/01/2022: 2.9 cm infrarenal abdominal aortic ectasia   Bladder cancer (HCC) 2023   BPH (benign prostatic hyperplasia) 09/06/2007   CAD s/p CABG    a.) 1990 s/p MI--> CABG x 3; b.) LHC/PCI 2002 --> BMS (unk type) to LCX; c.) LHC 07/07/2015: 50% oLM-LM, 80% oRCA, 80% pRCA-1, 80% pRCA-2, 60% dLAD, 100% o-pLAD, 60% mLAD, 50% D2, LIMA-LAD, SVG-D2, SVG-RI --> med Rx; d.) MV 06/05/2020: fixed apical ant/apical defect. No sign ischemia; e.) MV 09/08/202: EF 52%, no ischemia.   Carotid arterial disease (HCC)    a.) 08/2016 Carotid U/S: <39% bilat; b.) carotid doppler 10/12/2022: mild-to-moderate (<50%) bilateral bifurcation plaque.   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   CKD (chronic kidney disease), stage III (HCC)    Community acquired pneumonia of right lower lobe of lung 06/05/2017   Diverticulosis    Dupuytren's contracture of right hand 10/29/2008   DVT (deep venous thrombosis) (HCC) 1998   Elevated prostate specific antigen (PSA) 07/09/2008   Emphysema of lung (HCC)    GERD 04/30/2007   Heart murmur    History of 2019 novel coronavirus disease (COVID-19) 05/20/2019   History of hiatal hernia    History of shingles    HLD (hyperlipidemia) 04/26/2007   HTN (hypertension) 04/30/2007   Laceration of skin of left hand 03/08/2018   LBBB (left bundle branch block)    Lumbar disc disease with radiculopathy    Migraines    Myocardial infarction (HCC) 1990   a.) resulted in 3v CABG   Myocardial infarction (HCC) 2002   a.) second cardiac event; PCI with placement of BMS (unknown type) to LCx   Neuropathy of both feet    OSA on CPAP    Osteoarthritis    PAD (peripheral artery disease) (HCC)    a.  07/2017 LE duplex: RSFA 75-75m, LSFA 75-71m, 50-74d; c. 08/2018 Periph Angio: No signif AoIliac dzs. Mod-sev Ca2+ RSFA w/ diff dzs throughout- 3 vessel runoff. Borderline signif LSFA dzs w/ mod-sev Ca2+ vessels and 3 vessel runoff below the knee-->Med rx.   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   Right mid dorsum forearm - in situ - EDC    Tobacco Use: Social History   Tobacco Use  Smoking Status Former   Packs/day: 1.00   Years: 25.00   Additional pack years: 0.00   Total pack years: 25.00   Types: Cigarettes   Quit date: 08/15/1988   Years since quitting: 34.5   Passive exposure: Past  Smokeless Tobacco Never    Labs: Review Flowsheet  More data exists      Latest Ref Rng & Units 11/12/2021 02/21/2022 10/07/2022 01/13/2023 02/03/2023  Labs for ITP Cardiac and Pulmonary Rehab  Cholestrol 0 - 200 mg/dL - 98  409  - -  LDL (calc) 0 - 99 mg/dL - 49  49  - -  HDL-C >81.19 mg/dL - 14.78  29.56  - -  Trlycerides 0.0 - 149.0 mg/dL - 21.3  08.6  - -  Hemoglobin A1c 4.0 - 5.6 % 6.4  6.5  6.1  - 6.3   PH, Arterial 7.35 - 7.45 - - - 7.357  -  PCO2 arterial 32 - 48 mmHg - - - 41.8  -  Bicarbonate 20.0 - 28.0 mmol/L - - - 24.4  23.4  -  TCO2 22 - 32 mmol/L - - - 26  25  -  Acid-base deficit 0.0 - 2.0 mmol/L - - - 1.0  2.0  -  O2 Saturation % - - - 64  86  -     Exercise Target Goals: Exercise Program Goal: Individual exercise prescription set using results from initial 6 min walk test and THRR while considering  patient's activity barriers and safety.   Exercise Prescription Goal: Initial exercise prescription builds to 30-45 minutes a day of aerobic activity, 2-3 days per week.  Home exercise guidelines will be given to  patient during program as part of exercise prescription that the participant will acknowledge.   Education: Aerobic Exercise: - Group verbal and visual presentation on the components of exercise prescription. Introduces F.I.T.T principle from ACSM for exercise prescriptions.  Reviews F.I.T.T. principles of aerobic exercise including progression. Written material given at graduation. Flowsheet Row Cardiac Rehab from 02/02/2023 in [facility] Cardiac and Pulmonary Rehab  Education need identified 02/02/23       Education: Resistance Exercise: - Group verbal and visual presentation on the components of exercise prescription. Introduces F.I.T.T principle from ACSM for exercise prescriptions  Reviews F.I.T.T. principles of resistance exercise including progression. Written material given at graduation.    Education: Exercise & Equipment Safety: - Individual verbal instruction and demonstration of equipment use and safety with use of the equipment. Flowsheet Row Cardiac Rehab from 02/02/2023 in [facility] Cardiac and Pulmonary Rehab  Date 02/02/23  Educator NT  Instruction Review Code 1- Verbalizes Understanding       Education: Exercise Physiology & General Exercise Guidelines: - Group verbal and written instruction with models to review the exercise physiology of the cardiovascular system and associated critical values. Provides general exercise guidelines with specific guidelines to those with heart or lung disease.    Education: Flexibility, Balance, Mind/Body Relaxation: - Group verbal and visual presentation with interactive activity on the components of exercise prescription. Introduces F.I.T.T principle from ACSM for exercise prescriptions. Reviews F.I.T.T. principles of flexibility and balance exercise training including progression. Also discusses the mind body connection.  Reviews various relaxation techniques to help reduce and manage stress (i.e. Deep breathing, progressive muscle relaxation,  and visualization). Balance handout provided to take home. Written material given at graduation.   Activity Barriers & Risk Stratification:  Activity Barriers & Cardiac Risk Stratification - 01/25/23 1436  Activity Barriers & Cardiac Risk Stratification   Activity Barriers Back Problems;Left Hip Replacement;Right Hip Replacement;Right Knee Replacement;History of Falls   dizzy one time   has not happened since   Cardiac Risk Stratification Moderate             6 Minute Walk:   Oxygen Initial Assessment:   Oxygen Re-Evaluation:   Oxygen Discharge (Final Oxygen Re-Evaluation):   Initial Exercise Prescription:   Perform Capillary Blood Glucose checks as needed.  Exercise Prescription Changes:   Exercise Comments:   Exercise Goals and Review:   Exercise Goals     Row Name 02/02/23 1556             Exercise Goals   Increase Physical Activity Yes       Intervention Provide advice, education, support and counseling about physical activity/exercise needs.;Develop an individualized exercise prescription for aerobic and resistive training based on initial evaluation findings, risk stratification, comorbidities and participant's personal goals.       Expected Outcomes Short Term: Attend rehab on a regular basis to increase amount of physical activity.;Long Term: Add in home exercise to make exercise part of routine and to increase amount of physical activity.;Long Term: Exercising regularly at least 3-5 days a week.       Increase Strength and Stamina Yes       Intervention Provide advice, education, support and counseling about physical activity/exercise needs.;Develop an individualized exercise prescription for aerobic and resistive training based on initial evaluation findings, risk stratification, comorbidities and participant's personal goals.       Expected Outcomes Short Term: Increase workloads from initial exercise prescription for resistance, speed, and  METs.;Short Term: Perform resistance training exercises routinely during rehab and add in resistance training at home;Long Term: Improve cardiorespiratory fitness, muscular endurance and strength as measured by increased METs and functional capacity ( )       Able to understand and use rate of perceived exertion (RPE) scale Yes       Intervention Provide education and explanation on how to use RPE scale       Expected Outcomes Short Term: Able to use RPE daily in rehab to express subjective intensity level;Long Term:  Able to use RPE to guide intensity level when exercising independently       Able to understand and use Dyspnea scale Yes       Intervention Provide education and explanation on how to use Dyspnea scale       Expected Outcomes Short Term: Able to use Dyspnea scale daily in rehab to express subjective sense of shortness of breath during exertion;Long Term: Able to use Dyspnea scale to guide intensity level when exercising independently       Knowledge and understanding of Target Heart Rate Range (THRR) Yes       Intervention Provide education and explanation of THRR including how the numbers were predicted and where they are located for reference       Expected Outcomes Short Term: Able to state/look up THRR;Long Term: Able to use THRR to govern intensity when exercising independently;Short Term: Able to use daily as guideline for intensity in rehab       Able to check pulse independently Yes       Intervention Review the importance of being able to check your own pulse for safety during independent exercise;Provide education and demonstration on how to check pulse in carotid and radial arteries.       Expected Outcomes Short Term: Able to explain  why pulse checking is important during independent exercise;Long Term: Able to check pulse independently and accurately       Understanding of Exercise Prescription Yes       Intervention Provide education, explanation, and written materials  on patient's individual exercise prescription       Expected Outcomes Short Term: Able to explain program exercise prescription;Long Term: Able to explain home exercise prescription to exercise independently                Exercise Goals Re-Evaluation :   Discharge Exercise Prescription (Final Exercise Prescription Changes):   Nutrition:  Target Goals: Understanding of nutrition guidelines, daily intake of sodium 1500mg , cholesterol 200mg , calories 30% from fat and 7% or less from saturated fats, daily to have 5 or more servings of fruits and vegetables.  Education: All About Nutrition: -Group instruction provided by verbal, written material, interactive activities, discussions, models, and posters to present general guidelines for heart healthy nutrition including fat, fiber, MyPlate, the role of sodium in heart healthy nutrition, utilization of the nutrition label, and utilization of this knowledge for meal planning. Follow up email sent as well. Written material given at graduation. Flowsheet Row Cardiac Rehab from 02/02/2023 in [facility] Cardiac and Pulmonary Rehab  Education need identified 02/02/23       Biometrics:  Pre Biometrics - 02/02/23 1556       Pre Biometrics   Height 6\' 1"  (1.854 m)    Weight 210 lb 4.8 oz (95.4 kg)    BMI (Calculated) 27.75              Nutrition Therapy Plan and Nutrition Goals:  Nutrition Therapy & Goals - 02/02/23 1552       Intervention Plan   Intervention Prescribe, educate and counsel regarding individualized specific dietary modifications aiming towards targeted core components such as weight, hypertension, lipid management, diabetes, heart failure and other comorbidities.    Expected Outcomes Short Term Goal: Understand basic principles of dietary content, such as calories, fat, sodium, cholesterol and nutrients.;Short Term Goal: A plan has been developed with personal nutrition goals set during dietitian appointment.;Long Term  Goal: Adherence to prescribed nutrition plan.             Nutrition Assessments:  MEDIFICTS Score Key: ?70 Need to make dietary changes  40-70 Heart Healthy Diet ? 40 Therapeutic Level Cholesterol Diet  Flowsheet Row Cardiac Rehab from 02/02/2023 in [facility] Cardiac and Pulmonary Rehab  Picture Your Plate Total Score on Admission 60      Picture Your Plate Scores: <16 Unhealthy dietary pattern with much room for improvement. 41-50 Dietary pattern unlikely to meet recommendations for good health and room for improvement. 51-60 More healthful dietary pattern, with some room for improvement.  >60 Healthy dietary pattern, although there may be some specific behaviors that could be improved.    Nutrition Goals Re-Evaluation:   Nutrition Goals Discharge (Final Nutrition Goals Re-Evaluation):   Psychosocial: Target Goals: Acknowledge presence or absence of significant depression and/or stress, maximize coping skills, provide positive support system. Participant is able to verbalize types and ability to use techniques and skills needed for reducing stress and depression.   Education: Stress, Anxiety, and Depression - Group verbal and visual presentation to define topics covered.  Reviews how body is impacted by stress, anxiety, and depression.  Also discusses healthy ways to reduce stress and to treat/manage anxiety and depression.  Written material given at graduation.   Education: Sleep Hygiene -Provides group verbal and written instruction  about how sleep can affect your health.  Define sleep hygiene, discuss sleep cycles and impact of sleep habits. Review good sleep hygiene tips.    Initial Review & Psychosocial Screening:  Initial Psych Review & Screening - 01/25/23 1436       Initial Review   Current issues with None Identified      Family Dynamics   Good Support System? Yes   wife, Harriett Sine   daughter     Barriers   Psychosocial barriers to participate in program There  are no identifiable barriers or psychosocial needs.      Screening Interventions   Interventions Encouraged to exercise;To provide support and resources with identified psychosocial needs;Provide feedback about the scores to participant    Expected Outcomes Short Term goal: Utilizing psychosocial counselor, staff and physician to assist with identification of specific Stressors or current issues interfering with healing process. Setting desired goal for each stressor or current issue identified.;Long Term Goal: Stressors or current issues are controlled or eliminated.;Short Term goal: Identification and review with participant of any Quality of Life or Depression concerns found by scoring the questionnaire.;Long Term goal: The participant improves quality of Life and PHQ9 Scores as seen by post scores and/or verbalization of changes             Quality of Life Scores:   Quality of Life - 02/02/23 1552       Quality of Life   Select Quality of Life      Quality of Life Scores   Health/Function Pre 28.6 %    Socioeconomic Pre 23.63 %    Psych/Spiritual Pre 30 %    Family Pre 30 %    GLOBAL Pre 27.94 %            Scores of 19 and below usually indicate a poorer quality of life in these areas.  A difference of  2-3 points is a clinically meaningful difference.  A difference of 2-3 points in the total score of the Quality of Life Index has been associated with significant improvement in overall quality of life, self-image, physical symptoms, and general health in studies assessing change in quality of life.  PHQ-9: Review Flowsheet  More data exists      02/21/2023 02/03/2023 02/02/2023 10/07/2022 06/20/2022  Depression screen PHQ 2/9  Decreased Interest 0 0 0 3 0  Down, Depressed, Hopeless 0 0 0 0 0  PHQ - 2 Score 0 0 0 3 0  Altered sleeping 0 - 1 1 -  Tired, decreased energy 0 - 1 2 -  Change in appetite 0 - 0 0 -  Feeling bad or failure about yourself  0 - 0 0 -  Trouble  concentrating 0 - 0 0 -  Moving slowly or fidgety/restless 0 - 0 0 -  Suicidal thoughts 0 - 0 0 -  PHQ-9 Score 0 - 2 6 -  Difficult doing work/chores Not difficult at all - Not difficult at all Not difficult at all -   Interpretation of Total Score  Total Score Depression Severity:  1-4 = Minimal depression, 5-9 = Mild depression, 10-14 = Moderate depression, 15-19 = Moderately severe depression, 20-27 = Severe depression   Psychosocial Evaluation and Intervention:  Psychosocial Evaluation - 01/25/23 1507       Psychosocial Evaluation & Interventions   Expected Outcomes STG [name] attends all scheduled sessions, he is able to progress with the exercise. LTG [name] continues to progress after discharge.    Continue  Psychosocial Services  Follow up required by staff             Psychosocial Re-Evaluation:   Psychosocial Discharge (Final Psychosocial Re-Evaluation):   Vocational Rehabilitation: Provide vocational rehab assistance to qualifying candidates.   Vocational Rehab Evaluation & Intervention:  Vocational Rehab - 01/25/23 1449       Initial Vocational Rehab Evaluation & Intervention   Assessment shows need for Vocational Rehabilitation No      Vocational Rehab Re-Evaulation   Comments retired             Education: Education Goals: Education classes will be provided on a variety of topics geared toward better understanding of heart health and risk factor modification. Participant will state understanding/return demonstration of topics presented as noted by education test scores.  Learning Barriers/Preferences:  Learning Barriers/Preferences - 01/25/23 1449       Learning Barriers/Preferences   Learning Barriers None    Learning Preferences None             General Cardiac Education Topics:  AED/CPR: - Group verbal and written instruction with the use of models to demonstrate the basic use of the AED with the basic ABC's of  resuscitation.   Anatomy and Cardiac Procedures: - Group verbal and visual presentation and models provide information about basic cardiac anatomy and function. Reviews the testing methods done to diagnose heart disease and the outcomes of the test results. Describes the treatment choices: Medical Management, Angioplasty, or Coronary Bypass Surgery for treating various heart conditions including Myocardial Infarction, Angina, Valve Disease, and Cardiac Arrhythmias.  Written material given at graduation.   Medication Safety: - Group verbal and visual instruction to review commonly prescribed medications for heart and lung disease. Reviews the medication, class of the drug, and side effects. Includes the steps to properly store meds and maintain the prescription regimen.  Written material given at graduation.   Intimacy: - Group verbal instruction through game format to discuss how heart and lung disease can affect sexual intimacy. Written material given at graduation..   Know Your Numbers and Heart Failure: - Group verbal and visual instruction to discuss disease risk factors for cardiac and pulmonary disease and treatment options.  Reviews associated critical values for Overweight/Obesity, Hypertension, Cholesterol, and Diabetes.  Discusses basics of heart failure: signs/symptoms and treatments.  Introduces Heart Failure Zone chart for action plan for heart failure.  Written material given at graduation.   Infection Prevention: - Provides verbal and written material to individual with discussion of infection control including proper hand washing and proper equipment cleaning during exercise session. Flowsheet Row Cardiac Rehab from 02/02/2023 in [facility] Cardiac and Pulmonary Rehab  Date 02/02/23  Educator NT  Instruction Review Code 1- Verbalizes Understanding       Falls Prevention: - Provides verbal and written material to individual with discussion of falls prevention and  safety. Flowsheet Row Cardiac Rehab from 02/02/2023 in [facility] Cardiac and Pulmonary Rehab  Date 01/25/23  Educator SB  Instruction Review Code 1- Verbalizes Understanding       Other: -Provides group and verbal instruction on various topics (see comments)   Knowledge Questionnaire Score:  Knowledge Questionnaire Score - 02/02/23 1520       Knowledge Questionnaire Score   Pre Score 23/26             Core Components/Risk Factors/Patient Goals at Admission:  Personal Goals and Risk Factors at Admission - 01/25/23 1449       Core Components/Risk Factors/Patient Goals  on Admission    Weight Management Yes    Intervention Weight Management: Develop a combined nutrition and exercise program designed to reach desired caloric intake, while maintaining appropriate intake of nutrient and fiber, sodium and fats, and appropriate energy expenditure required for the weight goal.;Weight Management: Provide education and appropriate resources to help participant work on and attain dietary goals.    Admit Weight 208 lb (94.3 kg)    Goal Weight: Long Term 208 lb (94.3 kg)    Expected Outcomes Short Term: Continue to assess and modify interventions until short term weight is achieved;Long Term: Adherence to nutrition and physical activity/exercise program aimed toward attainment of established weight goal;Weight Maintenance: Understanding of the daily nutrition guidelines, which includes 25-35% calories from fat, 7% or less cal from saturated fats, less than 200mg  cholesterol, less than 1.5gm of sodium, & 5 or more servings of fruits and vegetables daily    Heart Failure Yes    Intervention Provide a combined exercise and nutrition program that is supplemented with education, support and counseling about heart failure. Directed toward relieving symptoms such as shortness of breath, decreased exercise tolerance, and extremity edema.    Expected Outcomes Improve functional capacity of life;Short term:  Attendance in program 2-3 days a week with increased exercise capacity. Reported lower sodium intake. Reported increased fruit and vegetable intake. Reports medication compliance.;Short term: Daily weights obtained and reported for increase. Utilizing diuretic protocols set by physician.;Long term: Adoption of self-care skills and reduction of barriers for early signs and symptoms recognition and intervention leading to self-care maintenance.    Hypertension Yes    Intervention Provide education on lifestyle modifcations including regular physical activity/exercise, weight management, moderate sodium restriction and increased consumption of fresh fruit, vegetables, and low fat dairy, alcohol moderation, and smoking cessation.;Monitor prescription use compliance.    Expected Outcomes Short Term: Continued assessment and intervention until BP is < 140/18mm HG in hypertensive participants. < 130/41mm HG in hypertensive participants with diabetes, heart failure or chronic kidney disease.;Long Term: Maintenance of blood pressure at goal levels.    Lipids Yes    Intervention Provide education and support for participant on nutrition & aerobic/resistive exercise along with prescribed medications to achieve LDL 70mg , HDL >40mg .    Expected Outcomes Short Term: Participant states understanding of desired cholesterol values and is compliant with medications prescribed. Participant is following exercise prescription and nutrition guidelines.;Long Term: Cholesterol controlled with medications as prescribed, with individualized exercise RX and with personalized nutrition plan. Value goals: LDL < 70mg , HDL > 40 mg.             Education:Diabetes - Individual verbal and written instruction to review signs/symptoms of diabetes, desired ranges of glucose level fasting, after meals and with exercise. Acknowledge that pre and post exercise glucose checks will be done for 3 sessions at entry of program.   Core  Components/Risk Factors/Patient Goals Review:    Core Components/Risk Factors/Patient Goals at Discharge (Final Review):    ITP Comments:  ITP Comments     Row Name 01/25/23 1509 02/21/23 1447         ITP Comments Virtual orientation call completed today. he has an appointment on Date: 02/02/2023  for EP eval and gym Orientation.  Documentation of diagnosis can be found in [facility] Date: 01/13/2023 . 30 Day review completed. Medical Director ITP review done, changes made as directed, and signed approval by Medical Director.   new to program  attended EP orientation has not started yet.  Comments:

## 2023-02-23 ENCOUNTER — Ambulatory Visit: Payer: HMO

## 2023-02-24 ENCOUNTER — Ambulatory Visit: Payer: HMO | Admitting: Podiatry

## 2023-02-24 ENCOUNTER — Ambulatory Visit (INDEPENDENT_AMBULATORY_CARE_PROVIDER_SITE_OTHER): Payer: HMO

## 2023-02-24 ENCOUNTER — Ambulatory Visit (INDEPENDENT_AMBULATORY_CARE_PROVIDER_SITE_OTHER): Payer: HMO | Admitting: Podiatry

## 2023-02-24 VITALS — BP 138/64 | HR 73 | Ht 72.0 in | Wt 210.0 lb

## 2023-02-24 DIAGNOSIS — Z951 Presence of aortocoronary bypass graft: Secondary | ICD-10-CM | POA: Diagnosis not present

## 2023-02-24 DIAGNOSIS — Z7982 Long term (current) use of aspirin: Secondary | ICD-10-CM | POA: Diagnosis not present

## 2023-02-24 DIAGNOSIS — I5020 Unspecified systolic (congestive) heart failure: Secondary | ICD-10-CM | POA: Diagnosis not present

## 2023-02-24 DIAGNOSIS — D692 Other nonthrombocytopenic purpura: Secondary | ICD-10-CM | POA: Diagnosis not present

## 2023-02-24 DIAGNOSIS — Z6827 Body mass index (BMI) 27.0-27.9, adult: Secondary | ICD-10-CM | POA: Diagnosis not present

## 2023-02-24 DIAGNOSIS — C679 Malignant neoplasm of bladder, unspecified: Secondary | ICD-10-CM | POA: Diagnosis not present

## 2023-02-24 DIAGNOSIS — N1831 Chronic kidney disease, stage 3a: Secondary | ICD-10-CM | POA: Diagnosis not present

## 2023-02-24 DIAGNOSIS — M779 Enthesopathy, unspecified: Secondary | ICD-10-CM | POA: Diagnosis not present

## 2023-02-24 DIAGNOSIS — S93402A Sprain of unspecified ligament of left ankle, initial encounter: Secondary | ICD-10-CM | POA: Diagnosis not present

## 2023-02-24 DIAGNOSIS — I739 Peripheral vascular disease, unspecified: Secondary | ICD-10-CM | POA: Diagnosis not present

## 2023-02-24 DIAGNOSIS — I25118 Atherosclerotic heart disease of native coronary artery with other forms of angina pectoris: Secondary | ICD-10-CM | POA: Diagnosis not present

## 2023-02-24 DIAGNOSIS — Z955 Presence of coronary angioplasty implant and graft: Secondary | ICD-10-CM | POA: Diagnosis not present

## 2023-02-24 NOTE — Progress Notes (Signed)
Chief Complaint  Patient presents with   Foot Pain    Left ankle; inside of joint, swelling/pain; fell June 15th    HPI: 82 y.o. male presenting today as a reestablish new patient for evaluation of an injury that was sustained on 01/28/2023 in [place], [place].  He says that he twisted his left ankle.  At that time it was very swollen but the swelling has since resolved and he no longer has any pain or tenderness to the ankle.  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 06/2019 Echo (in setting of COVID): EF 35-40%, mild LVH, g1 DD, glob HK; b. 09/2019 Echo: EF 50-55%, Gr1 DD; c. 03/2021 Echo: EF 50-55%, no rwma, mild LVH, GrI DD, nl RV fxn. RVSP 44.52mmHg. Mild-mod dil LA. Triv MR. Mild-mod Ao sclerosis.   Actinic keratosis    Anginal pain (HCC)    Aortic ectasia, abdominal (HCC)    a.) CT abd 01/01/2022: 2.9 cm infrarenal abdominal aortic ectasia   Bladder cancer (HCC) 2023   BPH (benign prostatic hyperplasia) 09/06/2007   CAD s/p CABG    a.) 1990 s/p MI--> CABG x 3; b.) LHC/PCI 2002 --> BMS (unk type) to LCX; c.) LHC 07/07/2015: 50% oLM-LM, 80% oRCA, 80% pRCA-1, 80% pRCA-2, 60% dLAD, 100% o-pLAD, 60% mLAD, 50% D2, LIMA-LAD, SVG-D2, SVG-RI --> med Rx; d.) MV 06/05/2020: fixed apical ant/apical defect. No sign ischemia; e.) MV 09/08/202: EF 52%, no ischemia.   Carotid arterial disease (HCC)    a.) 08/2016 Carotid U/S: <39% bilat; b.) carotid doppler 10/12/2022: mild-to-moderate (<50%) bilateral bifurcation plaque.   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   CKD (chronic kidney disease), stage III (HCC)    Community acquired pneumonia of right lower lobe of lung 06/05/2017   Diverticulosis    Dupuytren's contracture of right hand 10/29/2008   DVT (deep venous thrombosis) (HCC) 1998   Elevated prostate specific antigen (PSA) 07/09/2008    Emphysema of lung (HCC)    GERD 04/30/2007   Heart murmur    History of 2019 novel coronavirus disease (COVID-19) 05/20/2019   History of hiatal hernia    History of shingles    HLD (hyperlipidemia) 04/26/2007   HTN (hypertension) 04/30/2007   Laceration of skin of left hand 03/08/2018   LBBB (left bundle branch block)    Lumbar disc disease with radiculopathy    Migraines    Myocardial infarction (HCC) 1990   a.) resulted in 3v CABG   Myocardial infarction (HCC) 2002   a.) second cardiac event; PCI with placement of BMS (unknown type) to LCx   Neuropathy of both feet    OSA on CPAP    Osteoarthritis    PAD (peripheral artery disease) (HCC)    a. 07/2017 LE duplex: RSFA 75-29m, LSFA 75-10m, 50-74d; c. 08/2018 Periph Angio: No signif AoIliac dzs. Mod-sev Ca2+ RSFA w/ diff dzs throughout- 3 vessel runoff. Borderline signif LSFA dzs w/ mod-sev Ca2+ vessels and 3 vessel runoff below the knee-->Med rx.   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  Right mid dorsum forearm - in situ - [facility]    Past Surgical History:  Procedure Laterality Date   ABDOMINAL AORTOGRAM W/LOWER EXTREMITY [name] 09/12/2018   Procedure: ABDOMINAL AORTOGRAM W/LOWER EXTREMITY;  Surgeon: Iran Ouch, MD;  Location: [facility];  Service: Cardiovascular;  Laterality: [name];   ANTERIOR CERVICAL DECOMP/DISCECTOMY FUSION [name] 07/11/2022   Anterior Cervical Decompression Fusion ,Interboy Prothesis,Plate/Screws , Cervical four-five,Cervical five-six [name], Tinnie Gens, MD)   APPENDECTOMY     rupture   BACK SURGERY     1984 and then another one [facility]   BLADDER INSTILLATION [name] 10/19/2021   Procedure: BLADDER INSTILLATION OF GEMCITABINE;  Surgeon:  Riki Altes, MD;  Location: [facility];  Service: Urology;  Laterality: [name];   BLADDER INSTILLATION [name] 11/15/2022   Procedure: BLADDER INSTILLATION OF GEMCITABINE;  Surgeon: Riki Altes, MD;  Location: [facility];  Service: Urology;  Laterality: [name];   CARDIAC CATHETERIZATION [name] 07/07/2015   Procedure: Left Heart Cath and Cors/Grafts Angiography;  Surgeon: Antonieta Iba, MD;  Location: [facility];  Service: Cardiovascular;  Laterality: [name];   CATARACT EXTRACTION, BILATERAL  2009   CERVICAL SPINE SURGERY  12/2016   cervical stenosis [name])   COLONOSCOPY  03/2010   HP polyp, diverticulosis, rpt 10 yrs ([name])   CORONARY ANGIOPLASTY  11/30/2000   LCX   CORONARY ARTERY BYPASS GRAFT  1990   3 vessel    CORONARY STENT INTERVENTION [name] 01/13/2023   Procedure: CORONARY STENT INTERVENTION;  Surgeon: Iran Ouch, MD;  Location: [facility];  Service: Cardiovascular;  Laterality: [name];   CYSTOSCOPY  12/23/2010   [facility]   JOINT REPLACEMENT     KNEE ARTHROSCOPY Right    x2   LAMINOTOMY  1986   L5/S1 lumbar laminotomy for two ruptured discs/fusion   LUMBAR LAMINECTOMY/DECOMPRESSION MICRODISCECTOMY [name] 07/13/2016   Procedure: LUMBAR TWO-THREE, LUMBAR THREE-FOUR, LUMBAR FOUR-FIVE LAMINECTOMY AND FORAMINOTOMY;  Surgeon: Tressie Stalker, MD;  Location: [facility];  Service: Neurosurgery;  Laterality: [name];  LAMINECTOMY AND FORAMINOTOMY L2-L3, L3-L4,L4-L5   LUMBAR SPINE SURGERY  06/2020   Bilateral redo laminectomy/laminotomy/foraminotomies/medial facetectomy to decompress the bilateral L4 and L5 nerve roots ([facility])   RIGHT/LEFT HEART CATH AND CORONARY ANGIOGRAPHY Bilateral 01/13/2023   Procedure: RIGHT/LEFT HEART CATH AND CORONARY ANGIOGRAPHY;  Surgeon: Iran Ouch, MD;  Location: [facility];  Service: Cardiovascular;  Laterality: Bilateral;   TOTAL HIP ARTHROPLASTY Bilateral 1999,2000   TOTAL KNEE ARTHROPLASTY Right 03/06/2017   Procedure: RIGHT TOTAL KNEE  ARTHROPLASTY;  Surgeon: Ollen Gross, MD;  Location: [facility];  Service: Orthopedics;  Laterality: Right;   TRANSURETHRAL RESECTION OF BLADDER TUMOR [name] 10/19/2021   Procedure: TRANSURETHRAL RESECTION OF BLADDER TUMOR (TURBT);  Surgeon: Riki Altes, MD;  Location: [facility];  Service: Urology;  Laterality: [name];   TRANSURETHRAL RESECTION OF BLADDER TUMOR [name] 11/15/2022   Procedure: TRANSURETHRAL RESECTION OF BLADDER TUMOR (TURBT);  Surgeon: Riki Altes, MD;  Location: [facility];  Service: Urology;  Laterality: [name];    Allergies  Allergen Reactions   Vioxx [Rofecoxib] Other (See Comments)    Hemorrhage    Spiriva Respimat [Tiotropium Bromide Monohydrate] Other (See Comments)    Elevated bp   Contrast Media [Iodinated Contrast Media] Itching and Rash    Delayed reaction post abdominal aortagram.    Morphine Nausea Only and Other (See Comments)    Irritability      Physical Exam: General: The patient is alert and oriented x3 in  no acute distress.  Dermatology: Skin is warm, dry and supple bilateral lower extremities.   Vascular: Skin is warm to touch.  No edema.  Capillary refill WNL  Neurological: Grossly intact via light touch  Musculoskeletal Exam: No pedal deformities noted  Radiographic Exam LT ankle 02/24/2023:  Normal osseous mineralization. Joint spaces preserved.  Vascular clips noted along the anterior portion of the ankle from prior vascular surgery.  No fractures identified.  Impression: Negative for fracture  Assessment/Plan of Care: 1.  Ankle sprain left; resolved.  DOI: 01/28/2023  -X-rays reviewed.  Negative for fracture -Recommend good supportive tennis shoes. -Continue PVD management with Dr. Kirke Corin, interventional cardiology -Return to clinic as needed       Felecia Shelling, DPM [facility]  Dr. Felecia Shelling, DPM    [address] [place], [place] [address]                Office [phone]   Fax [phone]

## 2023-02-25 ENCOUNTER — Other Ambulatory Visit: Payer: Self-pay | Admitting: Family Medicine

## 2023-02-25 DIAGNOSIS — R7989 Other specified abnormal findings of blood chemistry: Secondary | ICD-10-CM

## 2023-02-25 DIAGNOSIS — N1831 Chronic kidney disease, stage 3a: Secondary | ICD-10-CM

## 2023-02-25 DIAGNOSIS — E1169 Type 2 diabetes mellitus with other specified complication: Secondary | ICD-10-CM

## 2023-02-27 DIAGNOSIS — S76312A Strain of muscle, fascia and tendon of the posterior muscle group at thigh level, left thigh, initial encounter: Secondary | ICD-10-CM | POA: Diagnosis not present

## 2023-02-27 DIAGNOSIS — M25552 Pain in left hip: Secondary | ICD-10-CM | POA: Diagnosis not present

## 2023-02-28 ENCOUNTER — Encounter: Payer: Self-pay | Admitting: Cardiovascular Disease

## 2023-02-28 ENCOUNTER — Ambulatory Visit: Payer: HMO

## 2023-02-28 ENCOUNTER — Other Ambulatory Visit (INDEPENDENT_AMBULATORY_CARE_PROVIDER_SITE_OTHER): Payer: HMO

## 2023-02-28 DIAGNOSIS — E785 Hyperlipidemia, unspecified: Secondary | ICD-10-CM

## 2023-02-28 DIAGNOSIS — E1169 Type 2 diabetes mellitus with other specified complication: Secondary | ICD-10-CM

## 2023-02-28 DIAGNOSIS — R7989 Other specified abnormal findings of blood chemistry: Secondary | ICD-10-CM

## 2023-02-28 DIAGNOSIS — N1831 Chronic kidney disease, stage 3a: Secondary | ICD-10-CM | POA: Diagnosis not present

## 2023-02-28 LAB — COMPREHENSIVE METABOLIC PANEL
ALT: 16 U/L (ref 0–53)
AST: 15 U/L (ref 0–37)
Albumin: 4.1 g/dL (ref 3.5–5.2)
Alkaline Phosphatase: 37 U/L — ABNORMAL LOW (ref 39–117)
BUN: 31 mg/dL — ABNORMAL HIGH (ref 6–23)
CO2: 27 mEq/L (ref 19–32)
Calcium: 9.1 mg/dL (ref 8.4–10.5)
Chloride: 106 mEq/L (ref 96–112)
Creatinine, Ser: 1.28 mg/dL (ref 0.40–1.50)
GFR: 52.34 mL/min — ABNORMAL LOW (ref >60.00)
Glucose, Bld: 124 mg/dL — ABNORMAL HIGH (ref 70–99)
Potassium: 4.2 mEq/L (ref 3.5–5.1)
Sodium: 138 mEq/L (ref 135–145)
Total Bilirubin: 0.7 mg/dL (ref 0.2–1.2)
Total Protein: 5.9 g/dL — ABNORMAL LOW (ref 6.0–8.3)

## 2023-02-28 LAB — PHOSPHORUS: Phosphorus: 3.7 mg/dL (ref 2.3–4.6)

## 2023-02-28 LAB — LIPID PANEL
Cholesterol: 117 mg/dL (ref 0–200)
HDL: 43.5 mg/dL (ref >39.00)
LDL Cholesterol: 59 mg/dL (ref 0–99)
NonHDL: 73.09
Total CHOL/HDL Ratio: 3
Triglycerides: 69 mg/dL (ref 0.0–149.0)
VLDL: 13.8 mg/dL (ref 0.0–40.0)

## 2023-02-28 LAB — MICROALBUMIN / CREATININE URINE RATIO
Creatinine,U: 45.3 mg/dL
Microalb Creat Ratio: 1.5 mg/g (ref 0.0–30.0)
Microalb, Ur: <0.7 mg/dL (ref 0.0–1.9)

## 2023-02-28 LAB — VITAMIN B12: Vitamin B-12: 301 pg/mL (ref 211–911)

## 2023-02-28 LAB — VITAMIN D 25 HYDROXY (VIT D DEFICIENCY, FRACTURES): VITD: 39.71 ng/mL (ref 30.00–100.00)

## 2023-03-01 LAB — PARATHYROID HORMONE, INTACT (NO CA): PTH: 63 pg/mL (ref 16–77)

## 2023-03-02 ENCOUNTER — Ambulatory Visit: Payer: HMO

## 2023-03-06 ENCOUNTER — Ambulatory Visit: Payer: HMO | Attending: Family Medicine | Admitting: Physical Therapy

## 2023-03-06 DIAGNOSIS — M25551 Pain in right hip: Secondary | ICD-10-CM | POA: Diagnosis not present

## 2023-03-06 DIAGNOSIS — M25552 Pain in left hip: Secondary | ICD-10-CM | POA: Insufficient documentation

## 2023-03-06 NOTE — Therapy (Signed)
OUTPATIENT PHYSICAL THERAPY LOWER EXTREMITY EVALUATION   Patient Name: [name] MRN: [id number] DOB:1941/08/07, 82 y.o., male Today's Date: 03/06/2023  END OF SESSION:  PT End of Session - 03/06/23 1610     Visit Number 1    Number of Visits 20    Date for PT Re-Evaluation 05/15/23    Authorization Type HTA [id number]    Authorization Time Period 03/06/2023-05/15/2023    Authorization - Visit Number 1    Authorization - Number of Visits 20    Progress Note Due on Visit 10    PT Start Time 1605    PT Stop Time 1645    PT Time Calculation (min) 40 min    Activity Tolerance Patient tolerated treatment well    Behavior During Therapy [facility] for tasks assessed/performed             Past Medical History:  Diagnosis Date   (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 06/2019 Echo (in setting of COVID): EF 35-40%, mild LVH, g1 DD, glob HK; b. 09/2019 Echo: EF 50-55%, Gr1 DD; c. 03/2021 Echo: EF 50-55%, no rwma, mild LVH, GrI DD, nl RV fxn. RVSP 44.22mmHg. Mild-mod dil LA. Triv MR. Mild-mod Ao sclerosis.   Actinic keratosis    Anginal pain (HCC)    Aortic ectasia, abdominal (HCC)    a.) CT abd 01/01/2022: 2.9 cm infrarenal abdominal aortic ectasia   Bladder cancer (HCC) 2023   BPH (benign prostatic hyperplasia) 09/06/2007   CAD s/p CABG    a.) 1990 s/p MI--> CABG x 3; b.) LHC/PCI 2002 --> BMS (unk type) to LCX; c.) LHC 07/07/2015: 50% oLM-LM, 80% oRCA, 80% pRCA-1, 80% pRCA-2, 60% dLAD, 100% o-pLAD, 60% mLAD, 50% D2, LIMA-LAD, SVG-D2, SVG-RI --> med Rx; d.) MV 06/05/2020: fixed apical ant/apical defect. No sign ischemia; e.) MV 09/08/202: EF 52%, no ischemia.   Carotid arterial disease (HCC)    a.) 08/2016 Carotid U/S: <39% bilat; b.) carotid doppler 10/12/2022: mild-to-moderate (<50%) bilateral bifurcation plaque.   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   CKD (chronic kidney disease), stage III (HCC)    Community acquired  pneumonia of right lower lobe of lung 06/05/2017   Diverticulosis    Dupuytren's contracture of right hand 10/29/2008   DVT (deep venous thrombosis) (HCC) 1998   Elevated prostate specific antigen (PSA) 07/09/2008   Emphysema of lung (HCC)    GERD 04/30/2007   Heart murmur    History of 2019 novel coronavirus disease (COVID-19) 05/20/2019   History of hiatal hernia    History of shingles    HLD (hyperlipidemia) 04/26/2007   HTN (hypertension) 04/30/2007   Laceration of skin of left hand 03/08/2018   LBBB (left bundle branch block)    Lumbar disc disease with radiculopathy    Migraines    Myocardial infarction (HCC) 1990   a.) resulted in 3v CABG   Myocardial infarction (HCC) 2002   a.) second cardiac event; PCI with placement of BMS (unknown type) to LCx   Neuropathy of both feet    OSA on CPAP    Osteoarthritis    PAD (peripheral artery disease) (HCC)    a. 07/2017 LE duplex: RSFA 75-47m, LSFA 75-25m, 50-74d; c. 08/2018 Periph Angio: No signif AoIliac dzs. Mod-sev Ca2+ RSFA w/ diff dzs throughout- 3 vessel runoff. Borderline signif LSFA dzs w/ mod-sev Ca2+ vessels and 3 vessel runoff below the knee-->Med rx.   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   Right mid dorsum forearm - in situ - EDC   Past Surgical History:  Procedure Laterality Date   ABDOMINAL AORTOGRAM W/LOWER EXTREMITY [name] 09/12/2018   Procedure: ABDOMINAL AORTOGRAM W/LOWER EXTREMITY;  Surgeon: Iran Ouch, MD;  Location: [facility];  Service: Cardiovascular;  Laterality: [name];   ANTERIOR CERVICAL DECOMP/DISCECTOMY FUSION [name] 07/11/2022   Anterior Cervical Decompression Fusion ,Interboy Prothesis,Plate/Screws , Cervical four-five,Cervical  five-six [name], Tinnie Gens, MD)   APPENDECTOMY     rupture   BACK SURGERY     1984 and then another one [facility]   BLADDER INSTILLATION [name] 10/19/2021   Procedure: BLADDER INSTILLATION OF GEMCITABINE;  Surgeon: Riki Altes, MD;  Location: [facility];  Service: Urology;  Laterality: [name];   BLADDER INSTILLATION [name] 11/15/2022   Procedure: BLADDER INSTILLATION OF GEMCITABINE;  Surgeon: Riki Altes, MD;  Location: [facility];  Service: Urology;  Laterality: [name];   CARDIAC CATHETERIZATION [name] 07/07/2015   Procedure: Left Heart Cath and Cors/Grafts Angiography;  Surgeon: Antonieta Iba, MD;  Location: [facility];  Service: Cardiovascular;  Laterality: [name];   CATARACT EXTRACTION, BILATERAL  2009   CERVICAL SPINE SURGERY  12/2016   cervical stenosis [name])   COLONOSCOPY  03/2010   HP polyp, diverticulosis, rpt 10 yrs ([name])   CORONARY ANGIOPLASTY  11/30/2000   LCX   CORONARY ARTERY BYPASS GRAFT  1990   3 vessel    CORONARY STENT INTERVENTION [name] 01/13/2023   Procedure: CORONARY STENT INTERVENTION;  Surgeon: Iran Ouch, MD;  Location: [facility];  Service: Cardiovascular;  Laterality: [name];   CYSTOSCOPY  12/23/2010   [facility]   JOINT REPLACEMENT     KNEE ARTHROSCOPY Right    x2   LAMINOTOMY  1986   L5/S1 lumbar laminotomy for two ruptured discs/fusion   LUMBAR LAMINECTOMY/DECOMPRESSION MICRODISCECTOMY [name] 07/13/2016   Procedure: LUMBAR TWO-THREE, LUMBAR THREE-FOUR, LUMBAR FOUR-FIVE LAMINECTOMY AND FORAMINOTOMY;  Surgeon: Tressie Stalker, MD;  Location: [facility];  Service: Neurosurgery;  Laterality: [name];  LAMINECTOMY AND FORAMINOTOMY L2-L3, L3-L4,L4-L5   LUMBAR SPINE SURGERY  06/2020   Bilateral redo laminectomy/laminotomy/foraminotomies/medial facetectomy to decompress the bilateral L4 and L5 nerve roots ([facility])   RIGHT/LEFT HEART CATH AND CORONARY ANGIOGRAPHY Bilateral 01/13/2023   Procedure: RIGHT/LEFT HEART CATH AND CORONARY ANGIOGRAPHY;  Surgeon: Iran Ouch, MD;  Location: [facility];  Service: Cardiovascular;  Laterality: Bilateral;   TOTAL HIP ARTHROPLASTY Bilateral 1999,2000   TOTAL KNEE ARTHROPLASTY Right 03/06/2017   Procedure: RIGHT TOTAL KNEE ARTHROPLASTY;  Surgeon: Ollen Gross, MD;  Location: [facility];  Service: Orthopedics;  Laterality: Right;   TRANSURETHRAL RESECTION OF BLADDER TUMOR [name] 10/19/2021   Procedure: TRANSURETHRAL RESECTION OF BLADDER TUMOR (TURBT);  Surgeon: Riki Altes, MD;  Location: [facility];  Service: Urology;  Laterality: [name];   TRANSURETHRAL RESECTION OF BLADDER TUMOR [name] 11/15/2022   Procedure: TRANSURETHRAL RESECTION OF BLADDER TUMOR (TURBT);  Surgeon: Riki Altes, MD;  Location: [facility];  Service: Urology;  Laterality: [name];   Patient Active Problem List   Diagnosis Date Noted   Injury of left ankle 02/03/2023   Abnormal stress test 01/14/2023  Angina pectoris (HCC) 01/13/2023   Acute on chronic systolic heart failure (HCC) 01/13/2023   Traumatic open wound of lower leg with delayed healing, left 12/28/2022   Retinal artery occlusion, branch, 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   Cervical spondylosis with radiculopathy 07/11/2022   Right low back pain 12/29/2021   Bladder tumor 11/12/2021   OSA (obstructive sleep apnea)    Chronic heart failure with preserved ejection fraction (HFpEF) (HCC)    Right leg pain 06/22/2021   Nocturnal hypoxia 06/11/2021   CKD (chronic kidney disease) stage 3, GFR 30-59 ml/min (HCC) 02/24/2021   Peripheral neuropathy 02/23/2021   Spondylolisthesis of lumbar region 07/07/2020   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   Coccydynia 09/20/2019   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   Anxiety 06/04/2019   Chronic right shoulder pain 09/10/2018   Cervical stenosis of spinal canal 06/05/2017   Pedal edema 06/05/2017   Health maintenance examination 12/06/2016   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/13/2016   Overweight (BMI 25.0-29.9)  07/04/2016   Low vitamin B12 level 01/01/2016   Exertional dyspnea 07/07/2015   Unstable angina (HCC)    Medicare annual wellness visit, subsequent 07/03/2015   Advanced care planning/counseling discussion 07/03/2015   Type 2 diabetes mellitus with other specified complication (HCC) 05/04/2015   Pleuritic chest pain 05/04/2015   Ex-smoker 05/04/2015   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   Spermatocele 04/02/2013   Syncopal vertigo 11/04/2010   Lumbar disc disease with radiculopathy 11/04/2010   Coronary artery disease involving native coronary artery of native heart with unstable angina pectoris (HCC) 08/11/2009   PAD (peripheral artery disease) (HCC) 08/11/2009   DUPUYTREN'S CONTRACTURE, 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 Lora Havens   REFERRING PROVIDER: Dr. Freddrick March   REFERRING DIAG: Left Hip Pain, Left Hamstring Strain   THERAPY DIAG:  Pain in left hip  Rationale for Evaluation and Treatment: Rehabilitation  ONSET DATE: 03/05/2021  SUBJECTIVE:   SUBJECTIVE STATEMENT: See pertinent history   PERTINENT HISTORY: Pt reports hearing a pop and pain in left hip after carrying a two by four. He has an extensive cardiac history having a stent placement due to blocked arteries. Per chart, he last saw PT in 2022 where it was reported that he was diagnosed with bladder cancer and he had to delay treatment for proximal hamstring strain. He finished up last PT session in October of 2023 with improvement in proximal left hamstring pain but he has since started to have hamstring pain again and he no longer follows his home exercise plan.  PAIN:  Are you  having pain? Yes: NPRS scale: 5/10 Pain location: Left ischial tuberosity  Pain description: Achy Aggravating factors: Bending forward with knee extension  Relieving factors: Not bending forward   PRECAUTIONS: None  RED FLAGS: None   WEIGHT BEARING RESTRICTIONS: No  FALLS:  Has patient fallen in last 6 months? Yes. Number of falls 1, tripped while walking outside in the rain  LIVING ENVIRONMENT: Lives with: lives with their spouse Lives in: House/apartment Stairs: Yes: External: 5 steps; on right going up and on left going up Has following equipment at home: Single point cane, Walker - 2 wheeled, Environmental consultant - 4 wheeled, and  scooter   OCCUPATION: Retired   PLOF: Independent  PATIENT GOALS: Feel less pain in left hip   NEXT MD VISIT: Nothing scheduled with Dr. Nelson Chimes   OBJECTIVE:   BP 124/53 HR 65 SpO2 97  DIAGNOSTIC FINDINGS: No imaging of left hip   PATIENT SURVEYS:  FOTO 48/100 with target of 57  COGNITION: Overall cognitive status: Within functional limits for tasks assessed     SENSATION: WFL    MUSCLE LENGTH: Hamstrings: Right 80 deg; Left 80 deg Thomas test: NT   POSTURE: rounded shoulders and forward head  PALPATION: Right Ischial Tuberosity  LOWER EXTREMITY ROM:  WFL   LUMBAR: All WFL with no pain with exception of forward flexion   LOWER EXTREMITY MMT:   MMT  Right eval Left eval  Hip flexion 4+ 4+  Hip extension NT  NT   Hip abduction 4 4  Hip adduction    Hip internal rotation    Hip external rotation    Knee flexion 4 4*  Knee extension 4 4  Ankle dorsiflexion 4+ 4+  Ankle plantarflexion    Ankle inversion    Ankle eversion      LOWER EXTREMITY SPECIAL TESTS:  Hip special tests: Luisa Hart (FABER) test: negative, Ober's test: negative, and FADIR: negative bilateral   GAIT: Distance walked: 50 ft  Assistive device utilized: None Level of assistance: Complete Independence Comments: Decreased step length bilaterally, but no other  gait deficits noted    TODAY'S TREATMENT:                                                                                                                              DATE:   03/06/23  Isometric Hamstring Supine Bridge with 5 sec hold 1 x 10    PATIENT EDUCATION:  Education details: form and technique for correct performance of exercise  Person educated: Patient Education method: Programmer, multimedia, Demonstration, Verbal cues, and Handouts Education comprehension: verbalized understanding and returned demonstration  HOME EXERCISE PROGRAM: Isometric Hamstring Supine Bridge with 5 sec hold 3 x 10 x 7 days per week   ASSESSMENT:  CLINICAL IMPRESSION: Patient is a 82 y.o. white male who was seen today for physical therapy evaluation and treatment for right proximal hamstring strain. He shows limited deficits with exception of pain with lumbar and hip flexion. Unable to complete exam due to limited time. Lumbar radiculopathy ruled out with negative SLR. Pt will benefit from skilled PT to reduce left proximal hamstring pain with activity and to regain left hamstring strength to perform bending and lifting tasks required for gardening and home upkeep.   OBJECTIVE IMPAIRMENTS: decreased strength and pain.   ACTIVITY LIMITATIONS: lifting, bending, and squatting  PARTICIPATION LIMITATIONS: yard work  PERSONAL FACTORS: Age, Time since onset of injury/illness/exacerbation, and 3+ comorbidities: CHF, h/o MI, COPD   are also affecting patient's functional outcome.   REHAB POTENTIAL: Good  CLINICAL DECISION MAKING: Stable/uncomplicated  EVALUATION COMPLEXITY: Low  GOALS: Goals reviewed with patient? No  SHORT TERM GOALS: Target date: 03/20/2023  Pt will be independent with HEP in order to improve strength and balance in order to decrease fall risk and improve function at home and work. Baseline: Not performed independently yet  Goal status: INITIAL    LONG TERM GOALS: Target date:  05/15/2023  Pt will demonstrate 1RM hamstring curl knee flex of LLE of at least 40.5# in order to demonstrate 90% of RLE, PLOF strength to complete heavy outdoor ADLs  Baseline: NT Goal status: INITIAL  2.  Patient will have improved function and activity level as evidenced by an increase in FOTO score by 10 points or more.  Baseline: 48/100 with target of 57  Goal status: INITIAL  3.  Patient will be show a reduction in NRPS of >=2 pts of left proximal hamstring as evidence of improved healing and function.  Baseline: 5/10 NRPS  Goal status: INITIAL    PLAN:  PT FREQUENCY: 1-2x/week  PT DURATION: 10 weeks  PLANNED INTERVENTIONS: Therapeutic exercises, Neuromuscular re-education, Gait training, Joint mobilization, Joint manipulation, Stair training, Aquatic Therapy, Dry Needling, Spinal manipulation, Spinal mobilization, Cryotherapy, Moist heat, Manual therapy, and Re-evaluation  PLAN FOR NEXT SESSION: 1RM for hamstring curl using OMEGA machine,eccentric hs curl with physioball  Ellin Goodie PT, DPT  [facility] [address] [place], [address] Phone: [phone]   Fax:  [phone]

## 2023-03-07 ENCOUNTER — Encounter: Payer: Self-pay | Admitting: Family Medicine

## 2023-03-07 ENCOUNTER — Ambulatory Visit (INDEPENDENT_AMBULATORY_CARE_PROVIDER_SITE_OTHER): Payer: HMO | Admitting: Family Medicine

## 2023-03-07 ENCOUNTER — Ambulatory Visit: Payer: HMO

## 2023-03-07 VITALS — BP 132/56 | HR 65 | Temp 97.3°F | Ht 71.5 in | Wt 208.2 lb

## 2023-03-07 DIAGNOSIS — I10 Essential (primary) hypertension: Secondary | ICD-10-CM | POA: Diagnosis not present

## 2023-03-07 DIAGNOSIS — I6523 Occlusion and stenosis of bilateral carotid arteries: Secondary | ICD-10-CM | POA: Diagnosis not present

## 2023-03-07 DIAGNOSIS — K219 Gastro-esophageal reflux disease without esophagitis: Secondary | ICD-10-CM | POA: Diagnosis not present

## 2023-03-07 DIAGNOSIS — C679 Malignant neoplasm of bladder, unspecified: Secondary | ICD-10-CM

## 2023-03-07 DIAGNOSIS — I739 Peripheral vascular disease, unspecified: Secondary | ICD-10-CM

## 2023-03-07 DIAGNOSIS — R600 Localized edema: Secondary | ICD-10-CM

## 2023-03-07 DIAGNOSIS — Z0000 Encounter for general adult medical examination without abnormal findings: Secondary | ICD-10-CM | POA: Diagnosis not present

## 2023-03-07 DIAGNOSIS — N1831 Chronic kidney disease, stage 3a: Secondary | ICD-10-CM

## 2023-03-07 DIAGNOSIS — Z7189 Other specified counseling: Secondary | ICD-10-CM

## 2023-03-07 DIAGNOSIS — R7989 Other specified abnormal findings of blood chemistry: Secondary | ICD-10-CM

## 2023-03-07 DIAGNOSIS — E785 Hyperlipidemia, unspecified: Secondary | ICD-10-CM | POA: Diagnosis not present

## 2023-03-07 DIAGNOSIS — J4489 Other specified chronic obstructive pulmonary disease: Secondary | ICD-10-CM

## 2023-03-07 DIAGNOSIS — I5032 Chronic diastolic (congestive) heart failure: Secondary | ICD-10-CM | POA: Diagnosis not present

## 2023-03-07 DIAGNOSIS — I25110 Atherosclerotic heart disease of native coronary artery with unstable angina pectoris: Secondary | ICD-10-CM

## 2023-03-07 DIAGNOSIS — G6289 Other specified polyneuropathies: Secondary | ICD-10-CM | POA: Diagnosis not present

## 2023-03-07 DIAGNOSIS — E1169 Type 2 diabetes mellitus with other specified complication: Secondary | ICD-10-CM

## 2023-03-07 DIAGNOSIS — G4733 Obstructive sleep apnea (adult) (pediatric): Secondary | ICD-10-CM

## 2023-03-07 MED ORDER — VITAMIN D3 25 MCG (1000 UT) PO CAPS: 1.0000 | ORAL_CAPSULE | ORAL | Status: DC

## 2023-03-07 MED ORDER — DOCUSATE SODIUM 100 MG PO CAPS: 100.0000 mg | ORAL_CAPSULE | Freq: Every day | ORAL | Status: AC | PRN

## 2023-03-07 MED ORDER — GABAPENTIN 600 MG PO TABS: 600.0000 mg | ORAL_TABLET | Freq: Three times a day (TID) | ORAL | 4 refills | Status: DC

## 2023-03-07 NOTE — Assessment & Plan Note (Addendum)
Now on lasix 20mg  daily with benefit.  Also on losartan
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
## 2023-03-07 NOTE — Assessment & Plan Note (Signed)
Chronic, diet controlled with latest A1c 6.3%.

## 2023-03-07 NOTE — Assessment & Plan Note (Signed)
Appreciate pulm care sees Dr Jayme Cloud regularly, on Trelegy Ellipta.

## 2023-03-07 NOTE — Assessment & Plan Note (Signed)
This improved after cardiac catheterization with stent placement.

## 2023-03-07 NOTE — Assessment & Plan Note (Signed)
Mild on latest [place] 09/2022.

## 2023-03-07 NOTE — Assessment & Plan Note (Signed)
Appreciate cards care - sees Dr Kirke Corin.

## 2023-03-07 NOTE — Telephone Encounter (Signed)
Updated pt's chart.  

## 2023-03-07 NOTE — Assessment & Plan Note (Signed)
S/p TURBT 10/2021 followed by [facility].  Appreciate urology care.

## 2023-03-07 NOTE — Assessment & Plan Note (Signed)
Chronic, stable period on zetia and atorvastatin through cardiology - continue The ASCVD Risk score (Arnett DK, et al., 2019) failed to calculate for the following reasons:   The 2019 ASCVD risk score is only valid for ages 22 to 82

## 2023-03-07 NOTE — Assessment & Plan Note (Signed)
Preventative protocols reviewed and updated unless pt declined. Discussed healthy diet and lifestyle.  

## 2023-03-07 NOTE — Assessment & Plan Note (Signed)
Continues pepcid daily.

## 2023-03-07 NOTE — Assessment & Plan Note (Signed)
Continue zetia and statin with aspirin

## 2023-03-07 NOTE — Patient Instructions (Addendum)
Good to see you today  Check with pharmacy about RSV shot. If you've gotten it, let us known date and we will update chart.  Continue current medicines including lasix 20mg  daily.  Return in 6 months for follow up visit.

## 2023-03-07 NOTE — Assessment & Plan Note (Signed)
Continues CPAP with nasal pillow

## 2023-03-07 NOTE — Assessment & Plan Note (Signed)
Previously discussed.

## 2023-03-07 NOTE — Assessment & Plan Note (Signed)
Chronic, stable on current regimen.  Continue this.

## 2023-03-07 NOTE — Assessment & Plan Note (Signed)
Rec restart vit B12 2x/wk

## 2023-03-07 NOTE — Assessment & Plan Note (Signed)
Chronic, presumed diabetic, on gabapentin 600mg  TID.

## 2023-03-07 NOTE — Assessment & Plan Note (Addendum)
Kidney function actually improved compared to last month, back to baseline GFR 50s.

## 2023-03-08 ENCOUNTER — Ambulatory Visit: Payer: HMO | Admitting: Physical Therapy

## 2023-03-08 DIAGNOSIS — M25552 Pain in left hip: Secondary | ICD-10-CM | POA: Diagnosis not present

## 2023-03-08 NOTE — Therapy (Addendum)
OUTPATIENT PHYSICAL THERAPY LOWER EXTREMITY TREATMENT   Patient Name: [name] MRN: [id number] DOB:[birth year], 82 y.o., male Today's Date: 03/06/2023   PT End of Session - 03/08/23 1354     Visit Number 2    Number of Visits 20    Date for PT Re-Evaluation 05/15/23    Authorization Type HTA [id number]    Authorization Time Period 03/06/2023-05/15/2023    Authorization - Visit Number 2    Authorization - Number of Visits 20    Progress Note Due on Visit 10    PT Start Time 1350    PT Stop Time 1430    PT Time Calculation (min) 40 min    Activity Tolerance Patient tolerated treatment well    Behavior During Therapy [name] for tasks assessed/performed                  Past Medical History:  Diagnosis Date   (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 06/2019 Echo (in setting of COVID): EF 35-40%, mild LVH, g1 DD, glob HK; b. 09/2019 Echo: EF 50-55%, Gr1 DD; c. 03/2021 Echo: EF 50-55%, no rwma, mild LVH, GrI DD, nl RV fxn. RVSP 44.35mmHg. Mild-mod dil LA. Triv MR. Mild-mod Ao sclerosis.   Actinic keratosis    Anginal pain (HCC)    Aortic ectasia, abdominal (HCC)    a.) CT abd 01/01/2022: 2.9 cm infrarenal abdominal aortic ectasia   Bladder cancer (HCC) 2023   BPH (benign prostatic hyperplasia) 09/06/2007   CAD s/p CABG    a.) 1990 s/p MI--> CABG x 3; b.) LHC/PCI 2002 --> BMS (unk type) to LCX; c.) LHC 07/07/2015: 50% oLM-LM, 80% oRCA, 80% pRCA-1, 80% pRCA-2, 60% dLAD, 100% o-pLAD, 60% mLAD, 50% D2, LIMA-LAD, SVG-D2, SVG-RI --> med Rx; d.) MV 06/05/2020: fixed apical ant/apical defect. No sign ischemia; e.) MV 09/08/202: EF 52%, no ischemia.   Carotid arterial disease (HCC)    a.) 08/2016 Carotid U/S: <39% bilat; b.) carotid doppler 10/12/2022: mild-to-moderate (<50%) bilateral bifurcation plaque.   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   CKD (chronic kidney disease), stage III (HCC)    Community acquired pneumonia of  right lower lobe of lung 06/05/2017   Diverticulosis    Dupuytren's contracture of right hand 10/29/2008   DVT (deep venous thrombosis) (HCC) 1998   Elevated prostate specific antigen (PSA) 07/09/2008   Emphysema of lung (HCC)    GERD 04/30/2007   Heart murmur    History of 2019 novel coronavirus disease (COVID-19) 05/20/2019   History of hiatal hernia    History of shingles    HLD (hyperlipidemia) 04/26/2007   HTN (hypertension) 04/30/2007   Laceration of skin of left hand 03/08/2018   LBBB (left bundle branch block)    Lumbar disc disease with radiculopathy    Migraines    Myocardial infarction (HCC) 1990   a.) resulted in 3v CABG   Myocardial infarction (HCC) 2002   a.) second cardiac event; PCI with placement of BMS (unknown type) to LCx   Neuropathy of both feet    OSA on CPAP    Osteoarthritis    PAD (peripheral artery disease) (HCC)    a. 07/2017 LE duplex: RSFA 75-57m, LSFA 75-33m, 50-74d; c. 08/2018 Periph Angio: No signif AoIliac dzs. Mod-sev Ca2+ RSFA w/ diff dzs throughout- 3 vessel runoff. Borderline signif LSFA dzs w/ mod-sev Ca2+ vessels and 3 vessel runoff below the knee-->Med rx.   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   Right mid dorsum forearm - in situ - EDC   Past Surgical History:  Procedure Laterality Date   ABDOMINAL AORTOGRAM W/LOWER EXTREMITY [name] 09/12/2018   Procedure: ABDOMINAL AORTOGRAM W/LOWER EXTREMITY;  Surgeon: Iran Ouch, MD;  Location: [facility];  Service: Cardiovascular;  Laterality: [name];   ANTERIOR CERVICAL DECOMP/DISCECTOMY FUSION [name] 07/11/2022   Anterior Cervical Decompression Fusion ,Interboy Prothesis,Plate/Screws , Cervical four-five,Cervical five-six  [name], Tinnie Gens, MD)   APPENDECTOMY     rupture   BACK SURGERY     1984 and then another one [facility]   BLADDER INSTILLATION [name] 10/19/2021   Procedure: BLADDER INSTILLATION OF GEMCITABINE;  Surgeon: Riki Altes, MD;  Location: [facility];  Service: Urology;  Laterality: [name];   BLADDER INSTILLATION [name] 11/15/2022   Procedure: BLADDER INSTILLATION OF GEMCITABINE;  Surgeon: Riki Altes, MD;  Location: [facility];  Service: Urology;  Laterality: [name];   CARDIAC CATHETERIZATION [name] 07/07/2015   Procedure: Left Heart Cath and Cors/Grafts Angiography;  Surgeon: Antonieta Iba, MD;  Location: [facility];  Service: Cardiovascular;  Laterality: [name];   CATARACT EXTRACTION, BILATERAL  2009   CERVICAL SPINE SURGERY  12/2016   cervical stenosis [name])   COLONOSCOPY  03/2010   HP polyp, diverticulosis, rpt 10 yrs ([name])   CORONARY ANGIOPLASTY  11/30/2000   LCX   CORONARY ARTERY BYPASS GRAFT  1990   3 vessel    CORONARY STENT INTERVENTION [name] 01/13/2023   Procedure: CORONARY STENT INTERVENTION;  Surgeon: Iran Ouch, MD;  Location: [facility];  Service: Cardiovascular;  Laterality: [name];   CYSTOSCOPY  12/23/2010   [facility]   JOINT REPLACEMENT     KNEE ARTHROSCOPY Right    x2   LAMINOTOMY  1986   L5/S1 lumbar laminotomy for two ruptured discs/fusion   LUMBAR LAMINECTOMY/DECOMPRESSION MICRODISCECTOMY [name] 07/13/2016   Procedure: LUMBAR TWO-THREE, LUMBAR THREE-FOUR, LUMBAR FOUR-FIVE LAMINECTOMY AND FORAMINOTOMY;  Surgeon: Tressie Stalker, MD;  Location: [facility];  Service: Neurosurgery;  Laterality: [name];  LAMINECTOMY AND FORAMINOTOMY L2-L3, L3-L4,L4-L5   LUMBAR SPINE SURGERY  06/2020   Bilateral redo laminectomy/laminotomy/foraminotomies/medial facetectomy to decompress the bilateral L4 and L5 nerve roots ([place])   RIGHT/LEFT HEART CATH AND CORONARY ANGIOGRAPHY Bilateral 01/13/2023   Procedure: RIGHT/LEFT HEART CATH AND CORONARY ANGIOGRAPHY;  Surgeon: Iran Ouch, MD;   Location: [facility];  Service: Cardiovascular;  Laterality: Bilateral;   TOTAL HIP ARTHROPLASTY Bilateral 1999,2000   TOTAL KNEE ARTHROPLASTY Right 03/06/2017   Procedure: RIGHT TOTAL KNEE ARTHROPLASTY;  Surgeon: Ollen Gross, MD;  Location: [facility];  Service: Orthopedics;  Laterality: Right;   TRANSURETHRAL RESECTION OF BLADDER TUMOR [name] 10/19/2021   Procedure: TRANSURETHRAL RESECTION OF BLADDER TUMOR (TURBT);  Surgeon: Riki Altes, MD;  Location: [facility];  Service: Urology;  Laterality: [name];   TRANSURETHRAL RESECTION OF BLADDER TUMOR [name] 11/15/2022   Procedure: TRANSURETHRAL RESECTION OF BLADDER TUMOR (TURBT);  Surgeon: Riki Altes, MD;  Location: [facility];  Service: Urology;  Laterality: [name];   Patient Active Problem List   Diagnosis Date Noted   Injury of left ankle 02/03/2023   Abnormal stress test 01/14/2023  Angina pectoris (HCC) 01/13/2023   Acute on chronic systolic heart failure (HCC) 01/13/2023   Traumatic open wound of lower leg with delayed healing, left 12/28/2022   Retinal artery occlusion, branch, 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   Cervical spondylosis with radiculopathy 07/11/2022   Right low back pain 12/29/2021   Bladder tumor 11/12/2021   OSA (obstructive sleep apnea)    Chronic heart failure with preserved ejection fraction (HFpEF) (HCC)    Right leg pain 06/22/2021   Nocturnal hypoxia 06/11/2021   CKD (chronic kidney disease) stage 3, GFR 30-59 ml/min (HCC) 02/24/2021   Peripheral neuropathy 02/23/2021   Spondylolisthesis of lumbar region 07/07/2020   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   Coccydynia 09/20/2019   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   Anxiety 06/04/2019   Chronic right shoulder pain 09/10/2018   Cervical stenosis of spinal canal 06/05/2017   Pedal edema 06/05/2017   Health maintenance examination 12/06/2016   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/13/2016   Overweight (BMI 25.0-29.9) 07/04/2016   Low  vitamin B12 level 01/01/2016   Exertional dyspnea 07/07/2015   Unstable angina (HCC)    Medicare annual wellness visit, subsequent 07/03/2015   Advanced care planning/counseling discussion 07/03/2015   Type 2 diabetes mellitus with other specified complication (HCC) 05/04/2015   Pleuritic chest pain 05/04/2015   Ex-smoker 05/04/2015   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   Spermatocele 04/02/2013   Syncopal vertigo 11/04/2010   Lumbar disc disease with radiculopathy 11/04/2010   Coronary artery disease involving native coronary artery of native heart with unstable angina pectoris (HCC) 08/11/2009   PAD (peripheral artery disease) (HCC) 08/11/2009   DUPUYTREN'S CONTRACTURE, 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 Lora Havens   REFERRING PROVIDER: Dr. Freddrick March   REFERRING DIAG: Left Hip Pain, Left Hamstring Strain   THERAPY DIAG:  Pain in left hip  Rationale for Evaluation and Treatment: Rehabilitation  ONSET DATE: 03/05/2021  SUBJECTIVE:   SUBJECTIVE STATEMENT: See pertinent history   PERTINENT HISTORY: Pt reports hearing a pop and pain in left hip after carrying a two by four. He has an extensive cardiac history having a stent placement due to blocked arteries. Per chart, he last saw PT in 2022 where it was reported that he was diagnosed with bladder cancer and he had to delay treatment for proximal hamstring strain. He finished up last PT session in October of 2023 with improvement in proximal left hamstring pain but he has since started to have hamstring pain again and he no longer follows his home exercise plan.  PAIN:  Are you having pain? Yes:  NPRS scale: 5/10 Pain location: Left ischial tuberosity  Pain description: Achy Aggravating factors: Bending forward with knee extension  Relieving factors: Not bending forward   PRECAUTIONS: None  RED FLAGS: None   WEIGHT BEARING RESTRICTIONS: No  FALLS:  Has patient fallen in last 6 months? Yes. Number of falls 1, tripped while walking outside in the rain  LIVING ENVIRONMENT: Lives with: lives with their spouse Lives in: House/apartment Stairs: Yes: External: 5 steps; on right going up and on left going up Has following equipment at home: Single point cane, Walker - 2 wheeled, Environmental consultant - 4 wheeled, and  scooter   OCCUPATION: Retired   PLOF: Independent  PATIENT GOALS: Feel less pain in left hip   NEXT MD VISIT: Nothing scheduled with Dr. Nelson Chimes   OBJECTIVE:   BP 124/53 HR 65 SpO2 97  DIAGNOSTIC FINDINGS: No imaging of left hip   PATIENT SURVEYS:  FOTO 48/100 with target of 57  COGNITION: Overall cognitive status: Within functional limits for tasks assessed     SENSATION: WFL    MUSCLE LENGTH: Hamstrings: Right 80 deg; Left 80 deg Thomas test: NT   POSTURE: rounded shoulders and forward head  PALPATION: Right Ischial Tuberosity  LOWER EXTREMITY ROM:  WFL   LUMBAR: All WFL with no pain with exception of forward flexion   LOWER EXTREMITY MMT:   MMT  Right eval Left eval  Hip flexion 4+ 4+  Hip extension NT  NT   Hip abduction 4 4  Hip adduction    Hip internal rotation    Hip external rotation    Knee flexion 4 4*  Knee extension 4 4  Ankle dorsiflexion 4+ 4+  Ankle plantarflexion    Ankle inversion    Ankle eversion      LOWER EXTREMITY SPECIAL TESTS:  Hip special tests: Luisa Hart (FABER) test: negative, Ober's test: negative, and FADIR: negative bilateral   GAIT: Distance walked: 50 ft  Assistive device utilized: None Level of assistance: Complete Independence Comments: Decreased step length bilaterally, but no other gait deficits noted     TODAY'S TREATMENT:                                                                                                                              DATE:   03/08/23: Matrix Recumbent Bike at 13 seat for 3 min -Pt reports severe left proximal hamstring pain  Nu-Step seat 13 for 6 min  OMEGA Eccentric HS Curl #10 on LLE 1 x 10  OMEGA Eccentric HS Curl #15 on LLE 1 x 10  OMEGA Eccentric HS Curl #20 on LLE 1 x 10  Hip Hinge with pole for TC cueing 3 x 10  -min VC to decrease speed of coming up  Sit to Stand from 18 inches 3 x 10  -min VC to decrease eccentric speed of exercise    03/06/23  Isometric Hamstring Supine Bridge with 5 sec hold 1 x 10    PATIENT EDUCATION:  Education details: form and technique for correct performance of exercise  Person educated: Patient Education method: Programmer, multimedia, Demonstration, Verbal cues, and Handouts Education comprehension: verbalized understanding and returned demonstration  HOME EXERCISE PROGRAM: Access Code: [email or web address] URL: [email or web address] Date: 03/08/2023 Prepared by: Ellin Goodie  Exercises - Sit to Stand  - 3-4 x weekly - 3 sets - 10 reps - Standing Hip Hinge with Dowel  - 3-4 x weekly - 3 sets - 10 reps  ASSESSMENT:  CLINICAL IMPRESSION: Patient is a 82 y.o. white male who was seen today for physical therapy evaluation and  treatment for right proximal hamstring strain. He shows limited deficits with exception of pain with lumbar and hip flexion. Unable to complete exam due to limited time. Lumbar radiculopathy ruled out with negative SLR. Pt will benefit from skilled PT to reduce left proximal hamstring pain with activity and to regain left hamstring strength to perform bending and lifting tasks required for gardening and home upkeep.   OBJECTIVE IMPAIRMENTS: decreased strength and pain.   ACTIVITY LIMITATIONS: lifting, bending, and squatting  PARTICIPATION LIMITATIONS: yard work  PERSONAL FACTORS: Age, Time  since onset of injury/illness/exacerbation, and 3+ comorbidities: CHF, h/o MI, COPD  are also affecting patient's functional outcome.   REHAB POTENTIAL: Good  CLINICAL DECISION MAKING: Stable/uncomplicated  EVALUATION COMPLEXITY: Low   GOALS: Goals reviewed with patient? No  SHORT TERM GOALS: Target date: 03/20/2023  Pt will be independent with HEP in order to improve strength and balance in order to decrease fall risk and improve function at home and work. Baseline: Not performed independently yet 03/08/23: Performing Exercises independently  Goal status: ONGOING     LONG TERM GOALS: Target date: 05/15/2023  Pt will demonstrate 1RM hamstring curl knee flex of LLE of >=90% of RLE for improved  strength to complete heavy outdoor ADLs  Baseline: NT Goal status: ONGOING   2.  Patient will have improved function and activity level as evidenced by an increase in FOTO score by 10 points or more.  Baseline: 48/100 with target of 57  Goal status: ONGOING   3.  Patient will be show a reduction in NRPS of >=2 pts of left proximal hamstring as evidence of improved healing and function.  Baseline: 5/10 NRPS  Goal status: ONGOING     PLAN:  PT FREQUENCY: 1-2x/week  PT DURATION: 10 weeks  PLANNED INTERVENTIONS: Therapeutic exercises, Neuromuscular re-education, Gait training, Joint mobilization, Joint manipulation, Stair training, Aquatic Therapy, Dry Needling, Spinal manipulation, Spinal mobilization, Cryotherapy, Moist heat, Manual therapy, and Re-evaluation  PLAN FOR NEXT SESSION: 1RM for hamstring curl using OMEGA machine,eccentric hs curl with physioball, continue to progress sit to stand and hip hinge   Ellin Goodie PT, DPT  [facility] [address] [place], [address] Phone: [phone]   Fax:  [phone]

## 2023-03-09 ENCOUNTER — Ambulatory Visit: Payer: HMO

## 2023-03-13 ENCOUNTER — Ambulatory Visit: Payer: HMO | Admitting: Physical Therapy

## 2023-03-13 DIAGNOSIS — M25552 Pain in left hip: Secondary | ICD-10-CM

## 2023-03-13 DIAGNOSIS — M25551 Pain in right hip: Secondary | ICD-10-CM

## 2023-03-13 NOTE — Therapy (Signed)
OUTPATIENT PHYSICAL THERAPY LOWER EXTREMITY TREATMENT   Patient Name: [name] MRN: [id number] DOB:November 29, 1940, 82 y.o., male Today's Date: 03/06/2023   PT End of Session - 03/13/23 1303     Visit Number 3    Number of Visits 20    Date for PT Re-Evaluation 05/15/23    Authorization Type HTA [id number]    Authorization Time Period 03/06/2023-05/15/2023    Authorization - Visit Number 3    Authorization - Number of Visits 20    Progress Note Due on Visit 10    PT Start Time 1300    PT Stop Time 1345    PT Time Calculation (min) 45 min    Activity Tolerance Patient tolerated treatment well    Behavior During Therapy [facility] for tasks assessed/performed                  Past Medical History:  Diagnosis Date   (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 06/2019 Echo (in setting of COVID): EF 35-40%, mild LVH, g1 DD, glob HK; b. 09/2019 Echo: EF 50-55%, Gr1 DD; c. 03/2021 Echo: EF 50-55%, no rwma, mild LVH, GrI DD, nl RV fxn. RVSP 44.22mmHg. Mild-mod dil LA. Triv MR. Mild-mod Ao sclerosis.   Actinic keratosis    Anginal pain (HCC)    Aortic ectasia, abdominal (HCC)    a.) CT abd 01/01/2022: 2.9 cm infrarenal abdominal aortic ectasia   Bladder cancer (HCC) 2023   BPH (benign prostatic hyperplasia) 09/06/2007   CAD s/p CABG    a.) 1990 s/p MI--> CABG x 3; b.) LHC/PCI 2002 --> BMS (unk type) to LCX; c.) LHC 07/07/2015: 50% oLM-LM, 80% oRCA, 80% pRCA-1, 80% pRCA-2, 60% dLAD, 100% o-pLAD, 60% mLAD, 50% D2, LIMA-LAD, SVG-D2, SVG-RI --> med Rx; d.) MV 06/05/2020: fixed apical ant/apical defect. No sign ischemia; e.) MV 09/08/202: EF 52%, no ischemia.   Carotid arterial disease (HCC)    a.) 08/2016 Carotid U/S: <39% bilat; b.) carotid doppler 10/12/2022: mild-to-moderate (<50%) bilateral bifurcation plaque.   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   CKD (chronic kidney disease), stage III (HCC)    Community acquired pneumonia of  right lower lobe of lung 06/05/2017   Diverticulosis    Dupuytren's contracture of right hand 10/29/2008   DVT (deep venous thrombosis) (HCC) 1998   Elevated prostate specific antigen (PSA) 07/09/2008   Emphysema of lung (HCC)    GERD 04/30/2007   Heart murmur    History of 2019 novel coronavirus disease (COVID-19) 05/20/2019   History of hiatal hernia    History of shingles    HLD (hyperlipidemia) 04/26/2007   HTN (hypertension) 04/30/2007   Laceration of skin of left hand 03/08/2018   LBBB (left bundle branch block)    Lumbar disc disease with radiculopathy    Migraines    Myocardial infarction (HCC) 1990   a.) resulted in 3v CABG   Myocardial infarction (HCC) 2002   a.) second cardiac event; PCI with placement of BMS (unknown type) to LCx   Neuropathy of both feet    OSA on CPAP    Osteoarthritis    PAD (peripheral artery disease) (HCC)    a. 07/2017 LE duplex: RSFA 75-3m, LSFA 75-23m, 50-74d; c. 08/2018 Periph Angio: No signif AoIliac dzs. Mod-sev Ca2+ RSFA w/ diff dzs throughout- 3 vessel runoff. Borderline signif LSFA dzs w/ mod-sev Ca2+ vessels and 3 vessel runoff below the knee-->Med rx.   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   Right mid dorsum forearm - in situ - [facility]C   Past Surgical History:  Procedure Laterality Date   ABDOMINAL AORTOGRAM W/LOWER EXTREMITY [name] 09/12/2018   Procedure: ABDOMINAL AORTOGRAM W/LOWER EXTREMITY;  Surgeon: Iran Ouch, MD;  Location: [facility];  Service: Cardiovascular;  Laterality: [name];   ANTERIOR CERVICAL DECOMP/DISCECTOMY FUSION [name] 07/11/2022   Anterior Cervical Decompression Fusion ,Interboy Prothesis,Plate/Screws , Cervical four-five,Cervical five-six  [name], Tinnie Gens, MD)   APPENDECTOMY     rupture   BACK SURGERY     1984 and then another one [facility]   BLADDER INSTILLATION [name] 10/19/2021   Procedure: BLADDER INSTILLATION OF GEMCITABINE;  Surgeon: Riki Altes, MD;  Location: [facility];  Service: Urology;  Laterality: [name];   BLADDER INSTILLATION [name] 11/15/2022   Procedure: BLADDER INSTILLATION OF GEMCITABINE;  Surgeon: Riki Altes, MD;  Location: [facility];  Service: Urology;  Laterality: [name];   CARDIAC CATHETERIZATION [name] 07/07/2015   Procedure: Left Heart Cath and Cors/Grafts Angiography;  Surgeon: Antonieta Iba, MD;  Location: [facility];  Service: Cardiovascular;  Laterality: [name];   CATARACT EXTRACTION, BILATERAL  2009   CERVICAL SPINE SURGERY  12/2016   cervical stenosis [name])   COLONOSCOPY  03/2010   HP polyp, diverticulosis, rpt 10 yrs ([name])   CORONARY ANGIOPLASTY  11/30/2000   LCX   CORONARY ARTERY BYPASS GRAFT  1990   3 vessel    CORONARY STENT INTERVENTION [name] 01/13/2023   Procedure: CORONARY STENT INTERVENTION;  Surgeon: Iran Ouch, MD;  Location: [facility];  Service: Cardiovascular;  Laterality: [name];   CYSTOSCOPY  12/23/2010   [facility]   JOINT REPLACEMENT     KNEE ARTHROSCOPY Right    x2   LAMINOTOMY  1986   L5/S1 lumbar laminotomy for two ruptured discs/fusion   LUMBAR LAMINECTOMY/DECOMPRESSION MICRODISCECTOMY [name] 07/13/2016   Procedure: LUMBAR TWO-THREE, LUMBAR THREE-FOUR, LUMBAR FOUR-FIVE LAMINECTOMY AND FORAMINOTOMY;  Surgeon: Tressie Stalker, MD;  Location: [facility];  Service: Neurosurgery;  Laterality: [name];  LAMINECTOMY AND FORAMINOTOMY L2-L3, L3-L4,L4-L5   LUMBAR SPINE SURGERY  06/2020   Bilateral redo laminectomy/laminotomy/foraminotomies/medial facetectomy to decompress the bilateral L4 and L5 nerve roots ([place])   RIGHT/LEFT HEART CATH AND CORONARY ANGIOGRAPHY Bilateral 01/13/2023   Procedure: RIGHT/LEFT HEART CATH AND CORONARY ANGIOGRAPHY;  Surgeon: Iran Ouch, MD;   Location: [facility];  Service: Cardiovascular;  Laterality: Bilateral;   TOTAL HIP ARTHROPLASTY Bilateral 1999,2000   TOTAL KNEE ARTHROPLASTY Right 03/06/2017   Procedure: RIGHT TOTAL KNEE ARTHROPLASTY;  Surgeon: Ollen Gross, MD;  Location: [facility];  Service: Orthopedics;  Laterality: Right;   TRANSURETHRAL RESECTION OF BLADDER TUMOR [name] 10/19/2021   Procedure: TRANSURETHRAL RESECTION OF BLADDER TUMOR (TURBT);  Surgeon: Riki Altes, MD;  Location: [facility];  Service: Urology;  Laterality: [name];   TRANSURETHRAL RESECTION OF BLADDER TUMOR [name] 11/15/2022   Procedure: TRANSURETHRAL RESECTION OF BLADDER TUMOR (TURBT);  Surgeon: Riki Altes, MD;  Location: [facility];  Service: Urology;  Laterality: [name];   Patient Active Problem List   Diagnosis Date Noted   Injury of left ankle 02/03/2023   Abnormal stress test 01/14/2023  Angina pectoris (HCC) 01/13/2023   Acute on chronic systolic heart failure (HCC) 01/13/2023   Traumatic open wound of lower leg with delayed healing, left 12/28/2022   Retinal artery occlusion, branch, 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   Cervical spondylosis with radiculopathy 07/11/2022   Right low back pain 12/29/2021   Bladder tumor 11/12/2021   OSA (obstructive sleep apnea)    Chronic heart failure with preserved ejection fraction (HFpEF) (HCC)    Right leg pain 06/22/2021   Nocturnal hypoxia 06/11/2021   CKD (chronic kidney disease) stage 3, GFR 30-59 ml/min (HCC) 02/24/2021   Peripheral neuropathy 02/23/2021   Spondylolisthesis of lumbar region 07/07/2020   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   Coccydynia 09/20/2019   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   Anxiety 06/04/2019   Chronic right shoulder pain 09/10/2018   Cervical stenosis of spinal canal 06/05/2017   Pedal edema 06/05/2017   Health maintenance examination 12/06/2016   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/13/2016   Overweight (BMI 25.0-29.9) 07/04/2016   Low  vitamin B12 level 01/01/2016   Exertional dyspnea 07/07/2015   Unstable angina (HCC)    Medicare annual wellness visit, subsequent 07/03/2015   Advanced care planning/counseling discussion 07/03/2015   Type 2 diabetes mellitus with other specified complication (HCC) 05/04/2015   Pleuritic chest pain 05/04/2015   Ex-smoker 05/04/2015   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   Spermatocele 04/02/2013   Syncopal vertigo 11/04/2010   Lumbar disc disease with radiculopathy 11/04/2010   Coronary artery disease involving native coronary artery of native heart with unstable angina pectoris (HCC) 08/11/2009   PAD (peripheral artery disease) (HCC) 08/11/2009   DUPUYTREN'S CONTRACTURE, 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 Lora Havens   REFERRING PROVIDER: Dr. Freddrick March   REFERRING DIAG: Left Hip Pain, Left Hamstring Strain   THERAPY DIAG:  Pain in left hip  Rationale for Evaluation and Treatment: Rehabilitation  ONSET DATE: 03/05/2021  SUBJECTIVE:   SUBJECTIVE STATEMENT: Pt states he is feeling increased low back pain and he is not sure why. He was not able to do a lot of the exercises because of the low back pain.   PERTINENT HISTORY: Pt reports hearing a pop and pain in left hip after carrying a two by four. He has an extensive cardiac history having a stent placement due to blocked arteries. Per chart, he last saw PT in 2022 where it was reported that he was diagnosed with bladder cancer and he had to delay treatment for proximal hamstring strain. He finished up last PT session in October of 2023 with improvement in proximal left hamstring pain but he has since  started to have hamstring pain again and he no longer follows his home exercise plan.  PAIN:  Are you having pain? Yes: NPRS scale: 5/10 Pain location: Central Spinous process of L1-L3  Pain description: Achy Aggravating factors: Bending forward with knee extension  Relieving factors: Not bending forward   PRECAUTIONS: None  RED FLAGS: None   WEIGHT BEARING RESTRICTIONS: No  FALLS:  Has patient fallen in last 6 months? Yes. Number of falls 1, tripped while walking outside in the rain  LIVING ENVIRONMENT: Lives with: lives with their spouse Lives in: House/apartment  Stairs: Yes: External: 5 steps; on right going up and on left going up Has following equipment at home: Single point cane, Walker - 2 wheeled, Environmental consultant - 4 wheeled, and scooter   OCCUPATION: Retired   PLOF: Independent  PATIENT GOALS: Feel less pain in left hip   NEXT MD VISIT: Nothing scheduled with Dr. Nelson Chimes   OBJECTIVE:   BP 124/53 HR 65 SpO2 97  DIAGNOSTIC FINDINGS: No imaging of left hip   PATIENT SURVEYS:  FOTO 48/100 with target of 57  COGNITION: Overall cognitive status: Within functional limits for tasks assessed     SENSATION: WFL    MUSCLE LENGTH: Hamstrings: Right 80 deg; Left 80 deg Thomas test: NT   POSTURE: rounded shoulders and forward head  PALPATION: Right Ischial Tuberosity  LOWER EXTREMITY ROM:  WFL   LUMBAR: All WFL with no pain with exception of forward flexion   LOWER EXTREMITY MMT:   MMT  Right eval Left eval  Hip flexion 4+ 4+  Hip extension NT  NT   Hip abduction 4 4  Hip adduction    Hip internal rotation    Hip external rotation    Knee flexion 4 4*  Knee extension 4 4  Ankle dorsiflexion 4+ 4+  Ankle plantarflexion    Ankle inversion    Ankle eversion      LOWER EXTREMITY SPECIAL TESTS:  Hip special tests: Luisa Hart (FABER) test: negative, Ober's test: negative, and FADIR: negative bilateral   GAIT: Distance walked: 50 ft  Assistive device  utilized: None Level of assistance: Complete Independence Comments: Decreased step length bilaterally, but no other gait deficits noted    TODAY'S TREATMENT:                                                                                                                              DATE:   03/13/23: Nu-Step with seat at 11 and resistance at 3 for 5 min  Seated Forward Flexion 3 x 30 sec  AAROM Lumbar Flexion Right 1 x 10 with 3 sec hold with silver ball roll  AAROM Lumbar Flexion Left 1 x 10 with 3 sec hold with silver ball roll  1 RM OMEGA HS Curl R/L 30/25 Standing Hip Extension with sliders under heel of sliding foot and BUE support 3 x 10  Half Deadlift with water jug 3 x 10  -mod VC to maintain slight knee bend and to generate motion at hips   MANUAL  L1-L2 central spinous process- AP grade III X 20     03/08/23: Matrix Recumbent Bike at 13 seat for 3 min -Pt reports severe left proximal hamstring pain  Nu-Step seat 13 for 6 min  OMEGA Eccentric HS Curl #10 on LLE 1 x 10  OMEGA Eccentric HS Curl #15 on LLE 1 x 10  OMEGA Eccentric HS Curl #20 on LLE 1 x 10  Hip Hinge with pole for TC cueing 3 x 10  -  min VC to decrease speed of coming up  Sit to Stand from 18 inches 3 x 10  -min VC to decrease eccentric speed of exercise    03/06/23  Isometric Hamstring Supine Bridge with 5 sec hold 1 x 10    PATIENT EDUCATION:  Education details: form and technique for correct performance of exercise  Person educated: Patient Education method: Programmer, multimedia, Demonstration, Verbal cues, and Handouts Education comprehension: verbalized understanding and returned demonstration  HOME EXERCISE PROGRAM: Access Code: [email or web address] URL: [email or web address] Date: 03/13/2023 Prepared by: Ellin Goodie  Exercises - Sit to Stand  - 3-4 x weekly - 3 sets - 10 reps - Standing Hip Extension with Counter Support  - 3-4 x weekly - 3 sets - 10 reps - Half Deadlift with Kettlebell  -  3-4 x weekly - 3 sets - 10 reps ASSESSMENT:  CLINICAL IMPRESSION:            Pt shows improvement in left hamstring strength with ability to perform weighted dead lift without provocation of pain. Despite increased back pain at start of session, pt was not limited by pain. He will continue to benefit from skilled PT to reduce left proximal hamstring pain with activity and to regain left hamstring strength to perform bending and lifting tasks required for gardening and home upkeep.   OBJECTIVE IMPAIRMENTS: decreased strength and pain.   ACTIVITY LIMITATIONS: lifting, bending, and squatting  PARTICIPATION LIMITATIONS: yard work  PERSONAL FACTORS: Age, Time since onset of injury/illness/exacerbation, and 3+ comorbidities: CHF, h/o MI, COPD  are also affecting patient's functional outcome.   REHAB POTENTIAL: Good  CLINICAL DECISION MAKING: Stable/uncomplicated  EVALUATION COMPLEXITY: Low   GOALS: Goals reviewed with patient? No  SHORT TERM GOALS: Target date: 03/20/2023  Pt will be independent with HEP in order to improve strength and balance in order to decrease fall risk and improve function at home and work. Baseline: Not performed independently yet 03/08/23: Performing Exercises independently  Goal status: ONGOING     LONG TERM GOALS: Target date: 05/15/2023  Pt will demonstrate 1RM hamstring curl knee flex of LLE of >=90% of RLE for improved  strength to complete heavy outdoor ADLs  Baseline: OMEGA HS Curl R/L 30/25  Goal status: ONGOING   2.  Patient will have improved function and activity level as evidenced by an increase in FOTO score by 10 points or more.  Baseline: 48/100 with target of 57  Goal status: ONGOING   3.  Patient will be show a reduction in NRPS of >=2 pts of left proximal hamstring as evidence of improved healing and function.  Baseline: 5/10 NRPS  Goal status: ONGOING     PLAN:  PT FREQUENCY: 1-2x/week  PT DURATION: 10 weeks  PLANNED  INTERVENTIONS: Therapeutic exercises, Neuromuscular re-education, Gait training, Joint mobilization, Joint manipulation, Stair training, Aquatic Therapy, Dry Needling, Spinal manipulation, Spinal mobilization, Cryotherapy, Moist heat, Manual therapy, and Re-evaluation  PLAN FOR NEXT SESSION: Continue to progress sit to stand and hip hinge . Palpation of left hamstrings to determine pain generating area   Ellin Goodie PT, DPT  [facility] [address] [place], [address] Phone: [phone]   Fax:  [phone]

## 2023-03-14 ENCOUNTER — Ambulatory Visit: Payer: HMO

## 2023-03-15 ENCOUNTER — Ambulatory Visit: Payer: HMO | Admitting: Physical Therapy

## 2023-03-16 ENCOUNTER — Ambulatory Visit
Admission: RE | Admit: 2023-03-16 | Discharge: 2023-03-16 | Disposition: A | Discharge status: Home / Self Care | Payer: HMO | Source: Ambulatory Visit | Attending: Pulmonary Disease | Admitting: Pulmonary Disease

## 2023-03-16 ENCOUNTER — Ambulatory Visit: Payer: HMO | Admitting: Pulmonary Disease

## 2023-03-16 ENCOUNTER — Encounter: Payer: Self-pay | Admitting: Pulmonary Disease

## 2023-03-16 ENCOUNTER — Ambulatory Visit
Admission: RE | Admit: 2023-03-16 | Discharge: 2023-03-16 | Disposition: A | Discharge status: Home / Self Care | Payer: HMO | Attending: Pulmonary Disease | Admitting: Pulmonary Disease

## 2023-03-16 ENCOUNTER — Ambulatory Visit: Payer: HMO

## 2023-03-16 VITALS — BP 104/58 | HR 66 | Temp 97.6°F | Ht 71.5 in | Wt 208.2 lb

## 2023-03-16 DIAGNOSIS — J4489 Other specified chronic obstructive pulmonary disease: Secondary | ICD-10-CM | POA: Diagnosis not present

## 2023-03-16 DIAGNOSIS — G4733 Obstructive sleep apnea (adult) (pediatric): Secondary | ICD-10-CM | POA: Diagnosis not present

## 2023-03-16 DIAGNOSIS — J40 Bronchitis, not specified as acute or chronic: Secondary | ICD-10-CM | POA: Diagnosis not present

## 2023-03-16 DIAGNOSIS — J209 Acute bronchitis, unspecified: Secondary | ICD-10-CM | POA: Insufficient documentation

## 2023-03-16 DIAGNOSIS — I429 Cardiomyopathy, unspecified: Secondary | ICD-10-CM

## 2023-03-16 DIAGNOSIS — R0602 Shortness of breath: Secondary | ICD-10-CM

## 2023-03-16 DIAGNOSIS — J4540 Moderate persistent asthma, uncomplicated: Secondary | ICD-10-CM | POA: Diagnosis not present

## 2023-03-16 MED ORDER — AZITHROMYCIN 250 MG PO TABS: ORAL_TABLET | ORAL | 0 refills | Status: DC

## 2023-03-16 MED ORDER — MONTELUKAST SODIUM 10 MG PO TABS: 10.0000 mg | ORAL_TABLET | Freq: Every day | ORAL | 2 refills | Status: DC

## 2023-03-16 MED ORDER — TRELEGY ELLIPTA 200-62.5-25 MCG/ACT IN AEPB: 1.0000 | INHALATION_SPRAY | Freq: Every day | RESPIRATORY_TRACT | Status: DC

## 2023-03-16 NOTE — Progress Notes (Signed)
Subjective:    Patient ID: [name], male    DOB: 1941-08-06, 82 y.o.   MRN: [id number]  Patient Care Team: Eustaquio Boyden, MD as PCP - General (Family Medicine) Mariah Milling Tollie Pizza, MD as PCP - Cardiology (Cardiology) Vilinda Flake, [facility] (Inactive) as Pharmacist (Pharmacist) Antonieta Iba, MD as Consulting Physician (Cardiology) Salena Saner, MD as Consulting Physician (Pulmonary Disease) Coralyn Helling, MD as Consulting Physician (Pulmonary Disease) Iran Ouch, MD as Consulting Physician (Cardiology)  Chief Complaint  Patient presents with   Follow-up    Wheezing and cough with yellow and green sputum for 3 days. No SOB.    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 Patient was last seen on 06 December 2022. He he is maintained on Trelegy Ellipta 200 which he states has helped him the most. He is compliant with his CPAP.  Currently he does not endorse any fevers, chills or sweats. Since he has been able to use Trelegy daily he has not had a cough of note.  No recent use of rescue inhaler. Has not had chest pain, no lower extremity edema or calf tenderness.  He has noted over the last 2 to 3 days that his cough is starting to change from yellow to pale green and is concerned about recurrent bronchitis.  It does seem that the triggering events usually are nasal congestion and drainage leading to his changes in sputum production.  Dyspnea is at baseline, no worsening.   CPAP compliance download shows 100% use and 29/30 days with over 4 hours use (97%).  Median pressure between 8 to 12.5  cm H2O.  Residual AHI 4.2.  Apnea index of 1.7 with central events.     He had an echocardiogram performed on 22 November 2022 showing LVEF of 40 to 45% with mild decrease left ventricular function.  Left ventricle demonstrates global  hypokinesis.  Right ventricular systolic function is normal.  There is mildly elevated pulmonary artery systolic pressure, left atrial size mildly dilated.  Mild mitral valve regurgitation, there is mild aortic stenosis.  His EF has decreased somewhat from prior.  He does follow with cardiology for these issues.   Review of Systems A 10 point review of systems was performed and it is as noted above otherwise negative.   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   OSA (obstructive sleep apnea)    Chronic heart failure with preserved ejection fraction (HFpEF) (HCC)    Right leg pain 06/22/2021   Nocturnal hypoxia 06/11/2021   CKD (chronic kidney disease) stage 3, GFR 30-59 ml/min (HCC) 02/24/2021   Peripheral neuropathy 02/23/2021   Spondylolisthesis of lumbar region 07/07/2020   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   Coccydynia 09/20/2019   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   Anxiety 06/04/2019   Chronic right shoulder pain 09/10/2018   Cervical stenosis of spinal canal 06/05/2017   Pedal edema 06/05/2017   Health maintenance examination 12/06/2016   Lumbar stenosis with  neurogenic claudication 07/13/2016   Overweight (BMI 25.0-29.9) 07/04/2016   Low vitamin B12 level 01/01/2016   Exertional dyspnea 07/07/2015   Unstable angina (HCC)    Medicare annual wellness visit, subsequent 07/03/2015   Advanced care planning/counseling discussion 07/03/2015   Type 2 diabetes mellitus with other specified complication (HCC) 05/04/2015   Pleuritic chest pain 05/04/2015   Ex-smoker 05/04/2015   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   Spermatocele 04/02/2013   Syncopal vertigo 11/04/2010   Lumbar disc disease with radiculopathy 11/04/2010   Coronary artery disease involving native coronary artery of native heart with unstable angina pectoris (HCC) 08/11/2009   PAD (peripheral artery disease) (HCC) 08/11/2009   DUPUYTREN'S CONTRACTURE, RIGHT 10/29/2008   Carotid stenosis 07/09/2008   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/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   Benign prostatic hyperplasia with urinary obstruction 09/06/2007   Essential hypertension 04/30/2007   GERD 04/30/2007   Hyperlipidemia associated with type 2 diabetes mellitus (HCC) 04/26/2007   OA (osteoarthritis) of knee 04/26/2007   DVT, HX OF 04/26/2007    Social History   Tobacco Use   Smoking status: Former    Current packs/day: 0.00    Average packs/day: 1 pack/day for 25.0 years (25.0 ttl pk-yrs)    Types: Cigarettes    Start date: 08/16/1963    Quit date: 08/15/1988    Years since quitting: 34.6    Passive exposure: Past   Smokeless tobacco: Never  Substance Use Topics   Alcohol use: No    Alcohol/week: 0.0 standard drinks of alcohol    Allergies  Allergen Reactions   Vioxx [Rofecoxib] Other (See Comments)    Hemorrhage    Spiriva Respimat [Tiotropium Bromide Monohydrate] Other (See Comments)    Elevated bp   Contrast Media [Iodinated Contrast Media] Itching and Rash    Delayed reaction post abdominal aortagram.    Morphine Nausea Only and Other (See Comments)    Irritability     Current Meds  Medication Sig   albuterol (PROVENTIL) (2.5 MG/3ML) 0.083% nebulizer solution Take 3 mLs (2.5 mg total) by nebulization every 6 (six) hours as needed for wheezing or shortness of breath.   albuterol (VENTOLIN HFA) 108 (90 Base) MCG/ACT inhaler TAKE 2 PUFFS INTO LUNGS EVERY 6 HOURS ASNEEDED FOR WHEEZING OR SHORTNESS OF BREATH   aspirin EC 81 MG EC tablet Take 1 tablet (81 mg total) by mouth daily.   atorvastatin (LIPITOR) 20 MG tablet TAKE 1 TABLET BY  MOUTH DAILY   carvedilol (COREG) 3.125 MG tablet Take 1 tablet (3.125 mg total) by mouth 2 (two) times daily.   Cholecalciferol (VITAMIN D3) 25 MCG (1000 UT) CAPS Take 1 capsule (1,000 Units total) by mouth 2 (two) times a week.   clopidogrel (PLAVIX) 75 MG tablet Take 1 tablet (75 mg total) by mouth daily with breakfast.   cyclobenzaprine (FLEXERIL) 5 MG tablet Take 1 tablet (5 mg total) by mouth 3 (three) times daily as needed for muscle spasms.   docusate sodium (COLACE) 100 MG capsule Take 1 capsule (100 mg total) by mouth daily as needed for mild constipation.   ezetimibe (ZETIA) 10 MG tablet Take 1 tablet (10 mg total) by mouth daily.   famotidine (PEPCID) 20 MG tablet Take 20 mg by mouth daily.   fluticasone (FLONASE) 50 MCG/ACT nasal spray Place 1 spray into both nostrils daily as needed for allergies or rhinitis.   Fluticasone-Umeclidin-Vilant (TRELEGY ELLIPTA) 200-62.5-25 MCG/ACT AEPB Inhale 1 puff into the lungs daily.   furosemide (LASIX) 40 MG tablet Take 0.5 tablets (20  mg total) by mouth daily.   gabapentin (NEURONTIN) 600 MG tablet Take 1 tablet (600 mg total) by mouth 3 (three) times daily.   HYDROcodone-acetaminophen (NORCO/VICODIN) 5-325 MG tablet Take 1-2 tablets by mouth every 4 (four) hours as needed for moderate pain.   isosorbide mononitrate (IMDUR) 30 MG 24 hr tablet Take 1 tablet (30 mg total) by mouth daily.   losartan (COZAAR) 25 MG tablet TAKE 1 TABLET BY MOUTH DAILY   mupirocin ointment (BACTROBAN) 2 % Apply 1 Application topically 2 (two) times daily.   nitroGLYCERIN (NITROSTAT) 0.4 MG SL tablet Place 1 tablet (0.4 mg total) under the tongue every 5 (five) minutes as needed for chest pain.   polyethylene glycol (MIRALAX / GLYCOLAX) 17 g packet Take 17 g by mouth daily as needed for mild constipation.    ranolazine (RANEXA) 500 MG 12 hr tablet TAKE ONE (1) TABLET BY MOUTH TWO TIMES PER DAY   silodosin (RAPAFLO) 8 MG CAPS capsule TAKE ONE CAPSULE BY MOUTH DAILY WITH  BREAKFAST.   Vibegron (GEMTESA) 75 MG TABS Take 1 tablet (75 mg total) by mouth daily.    Immunization History  Administered Date(s) Administered   Fluad Quad(high Dose 65+) 04/17/2019, 04/15/2022   Influenza Whole 08/15/2000, 07/04/2007, 05/09/2008, 05/31/2010   Influenza, High Dose Seasonal PF 05/15/2013, 06/25/2015, 07/05/2021   Influenza,inj,Quad PF,6+ Mos 04/17/2014, 05/18/2016, 06/07/2018   Influenza-Unspecified 05/22/2017, 05/28/2020   PFIZER(Purple Top)SARS-COV-2 Vaccination 09/07/2019, 09/28/2019, 05/12/2020, 01/19/2021   Pneumococcal Conjugate-13 02/09/2015   Pneumococcal Polysaccharide-23 08/15/2000, 10/29/2008   Respiratory Syncytial Virus Vaccine,Recomb Aduvanted(Arexvy) 10/10/2022   Td 08/15/2005   Tdap 02/10/2018   Zoster Recombinant(Shingrix) 04/05/2017, 07/25/2017   Zoster, Live 02/04/2009        Objective:     BP (!) 104/58 (BP Location: Left Arm, Cuff Size: Normal)   Pulse 66   Temp 97.6 F (36.4 C)   Ht 5' 11.5" (1.816 m)   Wt 208 lb 3.2 oz (94.4 kg)   SpO2 98%   BMI 28.63 kg/m   SpO2: 98 % O2 Device: None (Room air)  GENERAL: This is a well-developed, overweight gentleman, chronically ill-appearing, awake, alert, no acute distress.  Fully ambulatory. HEAD: Normocephalic, atraumatic.  EYES: Pupils equal, round, reactive to light.  No scleral icterus.  MOUTH: Nose/mouth/throat not examined due to masking requirements per hospital guidelines. NECK: Supple. No thyromegaly. Trachea midline. No JVD.  No adenopathy.  Anterior cervical fusion incision, healed. PULMONARY: Good air entry bilaterally, coarse breath sounds otherwise, no adventitious sounds.  CARDIOVASCULAR: S1 and S2. Regular rate and rhythm.  Grade 1/6 systolic ejection murmur left sternal border. GASTROINTESTINAL: Protuberant abdomen, soft, no tenderness.   MUSCULOSKELETAL: No joint deformity, no clubbing, no edema.  NEUROLOGIC: No overt focal deficits noted.  Speech is fluent.   SKIN:  Intact,warm,dry.  Limited exam shows no rashes.  He has multiple purpura and ecchymoses in the upper extremities. PSYCH: Mood and behavior normal.      Assessment & Plan:     ICD-10-CM   1. Moderate persistent asthma without complication  J45.40    Continue Trelegy Ellipta 200, 1 inhalation daily Continue as needed albuterol Add Singulair due to increasing nasal symptoms    2. Asthmatic bronchitis , chronic  J44.89    Trelegy and albuterol as above    3. Acute bronchitis, unspecified organism  J20.9 DG Chest 2 View   Azithromycin Dosepak    4. OSA (obstructive sleep apnea)  G47.33    Compliant with CPAP Continue therapy Auto  CPAP 5 to 15 cm H2O    5. Cardiomyopathy, unspecified type (HCC)  I42.9    This issue adds complexity to his management Follows with cardiology    6. Shortness of breath  R06.02 DG Chest 2 View   At baseline Multifactorial Cardiac/pulmonary     Orders Placed This Encounter  Procedures   DG Chest 2 View    Standing Status:   Future    Standing Expiration Date:   03/15/2024    Order Specific Question:   Reason for Exam (SYMPTOM  OR DIAGNOSIS REQUIRED)    Answer:   Bronchitis, shortness of breath    Order Specific Question:   Preferred imaging location?    Answer:   [facility]   Meds ordered this encounter  Medications   azithromycin (ZITHROMAX) 250 MG tablet    Sig: Take 2 tablets (500 mg) on  Day 1,  followed by 1 tablet (250 mg) once daily on Days 2 through 5.    Dispense:  6 each    Refill:  0   montelukast (SINGULAIR) 10 MG tablet    Sig: Take 1 tablet (10 mg total) by mouth daily.    Dispense:  30 tablet    Refill:  2   Will see the patient in follow-up in 3 months time call sooner should any new problems arise.   Gailen Shelter, MD Advanced Bronchoscopy PCCM [facility]-    *This note was dictated using voice recognition software/Dragon.  Despite best efforts to proofread, errors can occur which can  change the meaning. Any transcriptional errors that result from this process are unintentional and may not be fully corrected at the time of dictation.

## 2023-03-16 NOTE — Patient Instructions (Addendum)
We have sent prescriptions to your pharmacy of a Z-Pak (antibiotic)  We have also sent a prescription for Singulair (montelukast) this is 1 tablet daily.  We will get a chest x-ray today.  Will see him in follow-up in 3 months time call sooner should any new problems arise.

## 2023-03-17 ENCOUNTER — Telehealth: Payer: Self-pay | Admitting: Pulmonary Disease

## 2023-03-17 MED ORDER — AZITHROMYCIN 250 MG PO TABS: ORAL_TABLET | ORAL | 0 refills | Status: AC

## 2023-03-17 MED ORDER — MONTELUKAST SODIUM 10 MG PO TABS: 10.0000 mg | ORAL_TABLET | Freq: Every day | ORAL | 2 refills | Status: DC

## 2023-03-17 NOTE — Telephone Encounter (Signed)
Spoke to patient. [facility] stated that [facility] was down yesterday and they did not received Rx.  Singulair and zpak re sent to pharmacy.  Nothing further needed.

## 2023-03-21 ENCOUNTER — Encounter: Payer: Self-pay | Admitting: *Deleted

## 2023-03-21 ENCOUNTER — Ambulatory Visit: Payer: HMO

## 2023-03-21 DIAGNOSIS — Z955 Presence of coronary angioplasty implant and graft: Secondary | ICD-10-CM

## 2023-03-21 NOTE — Progress Notes (Signed)
Cardiac Individual Treatment Plan  Patient Details  Name: [name] MRN: [id number] Date of Birth: 10/13/1940 Referring Provider:    Initial Encounter Date:   Visit Diagnosis: Status post coronary artery stent placement  Patient's Home Medications on Admission:  Current Outpatient Medications:    albuterol (PROVENTIL) (2.5 MG/3ML) 0.083% nebulizer solution, Take 3 mLs (2.5 mg total) by nebulization every 6 (six) hours as needed for wheezing or shortness of breath., Disp: 75 mL, Rfl: 12   albuterol (VENTOLIN HFA) 108 (90 Base) MCG/ACT inhaler, TAKE 2 PUFFS INTO LUNGS EVERY 6 HOURS ASNEEDED FOR WHEEZING OR SHORTNESS OF BREATH, Disp: 8.5 g, Rfl: 6   aspirin EC 81 MG EC tablet, Take 1 tablet (81 mg total) by mouth daily., Disp: 30 tablet, Rfl: 0   atorvastatin (LIPITOR) 20 MG tablet, TAKE 1 TABLET BY MOUTH DAILY, Disp: 90 tablet, Rfl: 1   azithromycin (ZITHROMAX) 250 MG tablet, Take 2 tablets (500 mg) on  Day 1,  followed by 1 tablet (250 mg) once daily on Days 2 through 5., Disp: 6 each, Rfl: 0   carvedilol (COREG) 3.125 MG tablet, Take 1 tablet (3.125 mg total) by mouth 2 (two) times daily., Disp: 180 tablet, Rfl: 3   Cholecalciferol (VITAMIN D3) 25 MCG (1000 UT) CAPS, Take 1 capsule (1,000 Units total) by mouth 2 (two) times a week., Disp: , Rfl:    clopidogrel (PLAVIX) 75 MG tablet, Take 1 tablet (75 mg total) by mouth daily with breakfast., Disp: 90 tablet, Rfl: 3   cyclobenzaprine (FLEXERIL) 5 MG tablet, Take 1 tablet (5 mg total) by mouth 3 (three) times daily as needed for muscle spasms., Disp: 30 tablet, Rfl: 0   docusate sodium (COLACE) 100 MG capsule, Take 1 capsule (100 mg total) by mouth daily as needed for mild constipation., Disp: , Rfl:    ezetimibe (ZETIA) 10 MG tablet, Take 1 tablet (10 mg total) by mouth daily., Disp: 90 tablet, Rfl: 3   famotidine (PEPCID) 20 MG tablet, Take 20 mg by mouth daily., Disp: , Rfl:    fluticasone (FLONASE) 50 MCG/ACT nasal spray, Place 1  spray into both nostrils daily as needed for allergies or rhinitis., Disp: , Rfl:    Fluticasone-Umeclidin-Vilant (TRELEGY ELLIPTA) 200-62.5-25 MCG/ACT AEPB, Inhale 1 puff into the lungs daily., Disp: 180 each, Rfl: 0   Fluticasone-Umeclidin-Vilant (TRELEGY ELLIPTA) 200-62.5-25 MCG/ACT AEPB, Inhale 1 puff into the lungs daily., Disp: , Rfl:    furosemide (LASIX) 40 MG tablet, Take 0.5 tablets (20 mg total) by mouth daily., Disp: 90 tablet, Rfl: 3   gabapentin (NEURONTIN) 600 MG tablet, Take 1 tablet (600 mg total) by mouth 3 (three) times daily., Disp: 270 tablet, Rfl: 4   HYDROcodone-acetaminophen (NORCO/VICODIN) 5-325 MG tablet, Take 1-2 tablets by mouth every 4 (four) hours as needed for moderate pain., Disp: 30 tablet, Rfl: 0   isosorbide mononitrate (IMDUR) 30 MG 24 hr tablet, Take 1 tablet (30 mg total) by mouth daily., Disp: 90 tablet, Rfl: 3   losartan (COZAAR) 25 MG tablet, TAKE 1 TABLET BY MOUTH DAILY, Disp: 90 tablet, Rfl: 0   montelukast (SINGULAIR) 10 MG tablet, Take 1 tablet (10 mg total) by mouth daily., Disp: 30 tablet, Rfl: 2   mupirocin ointment (BACTROBAN) 2 %, Apply 1 Application topically 2 (two) times daily., Disp: 22 g, Rfl: 0   nitroGLYCERIN (NITROSTAT) 0.4 MG SL tablet, Place 1 tablet (0.4 mg total) under the tongue every 5 (five) minutes as needed for chest pain., Disp: 25  tablet, Rfl: 3   polyethylene glycol (MIRALAX / GLYCOLAX) 17 g packet, Take 17 g by mouth daily as needed for mild constipation. 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  06/2019 Echo (in setting of COVID): EF 35-40%, mild LVH, g1 DD, glob HK; b. 09/2019 Echo: EF 50-55%, Gr1 DD; c. 03/2021 Echo: EF 50-55%, no rwma, mild LVH, GrI DD, nl RV fxn. RVSP 44.66mmHg. Mild-mod dil LA. Triv MR. Mild-mod Ao sclerosis.   Actinic keratosis    Anginal pain (HCC)    Aortic ectasia, abdominal (HCC)    a.) CT abd 01/01/2022: 2.9 cm infrarenal abdominal aortic ectasia   Bladder cancer (HCC) 2023   BPH (benign prostatic hyperplasia) 09/06/2007   CAD s/p CABG    a.) 1990 s/p MI--> CABG x 3; b.) LHC/PCI 2002 --> BMS (unk type) to LCX; c.) LHC 07/07/2015: 50% oLM-LM, 80% oRCA, 80% pRCA-1, 80% pRCA-2, 60% dLAD, 100% o-pLAD, 60% mLAD, 50% D2, LIMA-LAD, SVG-D2, SVG-RI --> med Rx; d.) MV 06/05/2020: fixed apical ant/apical defect. No sign ischemia; e.) MV 09/08/202: EF 52%, no ischemia.   Carotid arterial disease (HCC)    a.) 08/2016 Carotid U/S: <39% bilat; b.) carotid doppler 10/12/2022: mild-to-moderate (<50%) bilateral bifurcation plaque.   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   CKD (chronic kidney disease), stage III (HCC)    Community acquired pneumonia of right lower lobe of lung 06/05/2017   Diverticulosis    Dupuytren's contracture of right hand 10/29/2008   DVT (deep venous thrombosis) (HCC) 1998   Elevated prostate specific antigen (PSA) 07/09/2008   Emphysema of lung (HCC)    GERD 04/30/2007   Heart murmur    History of 2019 novel coronavirus disease (COVID-19) 05/20/2019   History of hiatal hernia    History of shingles    HLD (hyperlipidemia) 04/26/2007   HTN (hypertension) 04/30/2007   Laceration of skin of left hand 03/08/2018   LBBB (left bundle branch block)    Lumbar disc disease with radiculopathy    Migraines    Myocardial infarction (HCC) 1990   a.) resulted in 3v CABG   Myocardial infarction (HCC) 2002   a.) second cardiac event; PCI with placement of BMS (unknown type) to LCx   Neuropathy of both feet    OSA on CPAP     Osteoarthritis    PAD (peripheral artery disease) (HCC)    a. 07/2017 LE duplex: RSFA 75-28m, LSFA 75-9m, 50-74d; c. 08/2018 Periph Angio: No signif AoIliac dzs. Mod-sev Ca2+ RSFA w/ diff dzs throughout- 3 vessel runoff. Borderline signif LSFA dzs w/ mod-sev Ca2+ vessels and 3 vessel runoff below the knee-->Med rx.   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   Right mid dorsum forearm - in situ - EDC    Tobacco Use: Social History   Tobacco Use  Smoking Status Former   Current packs/day: 0.00   Average packs/day: 1 pack/day for 25.0 years (25.0 ttl pk-yrs)   Types: Cigarettes   Start date: 08/16/1963   Quit date: 08/15/1988   Years since quitting: 34.6   Passive exposure: Past  Smokeless Tobacco Never    Labs: Review Flowsheet  More data exists      Latest Ref Rng & Units 02/21/2022 10/07/2022 01/13/2023 02/03/2023 02/28/2023  Labs for ITP Cardiac and Pulmonary Rehab  Cholestrol 0 - 200 mg/dL 98  119  - - 147   LDL (calc) 0 - 99 mg/dL 49  49  - - 59   HDL-C >39.00 mg/dL 82.95  62.13  - - 08.65   Trlycerides 0.0 - 149.0 mg/dL 78.4  69.6  - - 29.5   Hemoglobin A1c 4.0 - 5.6 % 6.5  6.1  - 6.3  -  PH, Arterial 7.35 - 7.45 - - 7.357  - -  PCO2 arterial 32 - 48 mmHg - - 41.8  - -  Bicarbonate 20.0 - 28.0 mmol/L - - 24.4  23.4  - -  TCO2 22 - 32 mmol/L - - 26  25  - -  Acid-base deficit 0.0 - 2.0 mmol/L - - 1.0  2.0  - -  O2 Saturation % - - 64  86  - -    Details       Multiple values from one day are sorted in reverse-chronological order          Exercise Target Goals: Exercise Program Goal: Individual exercise prescription set using results from initial 6 min walk test and THRR while considering  patient's activity  barriers and safety.   Exercise Prescription Goal: Initial exercise prescription builds to 30-45 minutes a day of aerobic activity, 2-3 days per week.  Home exercise guidelines will be given to patient during program as part of exercise prescription that the participant will acknowledge.   Education: Aerobic Exercise: - Group verbal and visual presentation on the components of exercise prescription. Introduces F.I.T.T principle from ACSM for exercise prescriptions.  Reviews F.I.T.T. principles of aerobic exercise including progression. Written material given at graduation. Flowsheet Row Cardiac Rehab from 02/02/2023 in [facility] Cardiac and Pulmonary Rehab  Education need identified 02/02/23       Education: Resistance Exercise: - Group verbal and visual presentation on the components of exercise prescription. Introduces F.I.T.T principle from ACSM for exercise prescriptions  Reviews F.I.T.T. principles of resistance exercise including progression. Written material given at graduation.    Education: Exercise & Equipment Safety: - Individual verbal instruction and demonstration of equipment use and safety with use of the equipment. Flowsheet Row Cardiac Rehab from 02/02/2023 in [facility] Cardiac and Pulmonary Rehab  Date 02/02/23  Educator NT  Instruction Review Code 1- Verbalizes Understanding       Education: Exercise Physiology & General Exercise Guidelines: - Group verbal and written instruction with models to review the exercise physiology of the cardiovascular system and associated critical values. Provides general exercise guidelines with specific guidelines to those with heart or lung disease.    Education: Flexibility, Balance, Mind/Body Relaxation: - Group verbal and visual presentation with interactive activity on the components of exercise prescription. Introduces F.I.T.T principle from ACSM for exercise prescriptions. Reviews F.I.T.T. principles of flexibility and balance exercise  training including progression. Also  discusses the mind body connection.  Reviews various relaxation techniques to help reduce and manage stress (i.e. Deep breathing, progressive muscle relaxation, and visualization). Balance handout provided to take home. Written material given at graduation.   Activity Barriers & Risk Stratification:  Activity Barriers & Cardiac Risk Stratification - 01/25/23 1436       Activity Barriers & Cardiac Risk Stratification   Activity Barriers Back Problems;Left Hip Replacement;Right Hip Replacement;Right Knee Replacement;History of Falls   dizzy one time   has not happened since   Cardiac Risk Stratification Moderate             6 Minute Walk:   Oxygen Initial Assessment:   Oxygen Re-Evaluation:   Oxygen Discharge (Final Oxygen Re-Evaluation):   Initial Exercise Prescription:   Perform Capillary Blood Glucose checks as needed.  Exercise Prescription Changes:   Exercise Comments:   Exercise Goals and Review:   Exercise Goals     Row Name 02/02/23 1556             Exercise Goals   Increase Physical Activity Yes       Intervention Provide advice, education, support and counseling about physical activity/exercise needs.;Develop an individualized exercise prescription for aerobic and resistive training based on initial evaluation findings, risk stratification, comorbidities and participant's personal goals.       Expected Outcomes Short Term: Attend rehab on a regular basis to increase amount of physical activity.;Long Term: Add in home exercise to make exercise part of routine and to increase amount of physical activity.;Long Term: Exercising regularly at least 3-5 days a week.       Increase Strength and Stamina Yes       Intervention Provide advice, education, support and counseling about physical activity/exercise needs.;Develop an individualized exercise prescription for aerobic and resistive training based on initial evaluation  findings, risk stratification, comorbidities and participant's personal goals.       Expected Outcomes Short Term: Increase workloads from initial exercise prescription for resistance, speed, and METs.;Short Term: Perform resistance training exercises routinely during rehab and add in resistance training at home;Long Term: Improve cardiorespiratory fitness, muscular endurance and strength as measured by increased METs and functional capacity ( )       Able to understand and use rate of perceived exertion (RPE) scale Yes       Intervention Provide education and explanation on how to use RPE scale       Expected Outcomes Short Term: Able to use RPE daily in rehab to express subjective intensity level;Long Term:  Able to use RPE to guide intensity level when exercising independently       Able to understand and use Dyspnea scale Yes       Intervention Provide education and explanation on how to use Dyspnea scale       Expected Outcomes Short Term: Able to use Dyspnea scale daily in rehab to express subjective sense of shortness of breath during exertion;Long Term: Able to use Dyspnea scale to guide intensity level when exercising independently       Knowledge and understanding of Target Heart Rate Range (THRR) Yes       Intervention Provide education and explanation of THRR including how the numbers were predicted and where they are located for reference       Expected Outcomes Short Term: Able to state/look up THRR;Long Term: Able to use THRR to govern intensity when exercising independently;Short Term: Able to use daily as guideline for intensity in rehab  Able to check pulse independently Yes       Intervention Review the importance of being able to check your own pulse for safety during independent exercise;Provide education and demonstration on how to check pulse in carotid and radial arteries.       Expected Outcomes Short Term: Able to explain why pulse checking is important during  independent exercise;Long Term: Able to check pulse independently and accurately       Understanding of Exercise Prescription Yes       Intervention Provide education, explanation, and written materials on patient's individual exercise prescription       Expected Outcomes Short Term: Able to explain program exercise prescription;Long Term: Able to explain home exercise prescription to exercise independently                Exercise Goals Re-Evaluation :   Discharge Exercise Prescription (Final Exercise Prescription Changes):   Nutrition:  Target Goals: Understanding of nutrition guidelines, daily intake of sodium 1500mg , cholesterol 200mg , calories 30% from fat and 7% or less from saturated fats, daily to have 5 or more servings of fruits and vegetables.  Education: All About Nutrition: -Group instruction provided by verbal, written material, interactive activities, discussions, models, and posters to present general guidelines for heart healthy nutrition including fat, fiber, MyPlate, the role of sodium in heart healthy nutrition, utilization of the nutrition label, and utilization of this knowledge for meal planning. Follow up email sent as well. Written material given at graduation. Flowsheet Row Cardiac Rehab from 02/02/2023 in [facility] Cardiac and Pulmonary Rehab  Education need identified 02/02/23       Biometrics:  Pre Biometrics - 02/02/23 1556       Pre Biometrics   Height 6\' 1"  (1.854 m)    Weight 210 lb 4.8 oz (95.4 kg)    BMI (Calculated) 27.75              Nutrition Therapy Plan and Nutrition Goals:  Nutrition Therapy & Goals - 02/02/23 1552       Intervention Plan   Intervention Prescribe, educate and counsel regarding individualized specific dietary modifications aiming towards targeted core components such as weight, hypertension, lipid management, diabetes, heart failure and other comorbidities.    Expected Outcomes Short Term Goal: Understand basic  principles of dietary content, such as calories, fat, sodium, cholesterol and nutrients.;Short Term Goal: A plan has been developed with personal nutrition goals set during dietitian appointment.;Long Term Goal: Adherence to prescribed nutrition plan.             Nutrition Assessments:  MEDIFICTS Score Key: ?70 Need to make dietary changes  40-70 Heart Healthy Diet ? 40 Therapeutic Level Cholesterol Diet  Flowsheet Row Cardiac Rehab from 02/02/2023 in [facility] Cardiac and Pulmonary Rehab  Picture Your Plate Total Score on Admission 60      Picture Your Plate Scores: <54 Unhealthy dietary pattern with much room for improvement. 41-50 Dietary pattern unlikely to meet recommendations for good health and room for improvement. 51-60 More healthful dietary pattern, with some room for improvement.  >60 Healthy dietary pattern, although there may be some specific behaviors that could be improved.    Nutrition Goals Re-Evaluation:   Nutrition Goals Discharge (Final Nutrition Goals Re-Evaluation):   Psychosocial: Target Goals: Acknowledge presence or absence of significant depression and/or stress, maximize coping skills, provide positive support system. Participant is able to verbalize types and ability to use techniques and skills needed for reducing stress and depression.   Education: Stress,  Anxiety, and Depression - Group verbal and visual presentation to define topics covered.  Reviews how body is impacted by stress, anxiety, and depression.  Also discusses healthy ways to reduce stress and to treat/manage anxiety and depression.  Written material given at graduation.   Education: Sleep Hygiene -Provides group verbal and written instruction about how sleep can affect your health.  Define sleep hygiene, discuss sleep cycles and impact of sleep habits. Review good sleep hygiene tips.    Initial Review & Psychosocial Screening:  Initial Psych Review & Screening - 01/25/23 1436        Initial Review   Current issues with None Identified      Family Dynamics   Good Support System? Yes   wife, Harriett Sine   daughter     Barriers   Psychosocial barriers to participate in program There are no identifiable barriers or psychosocial needs.      Screening Interventions   Interventions Encouraged to exercise;To provide support and resources with identified psychosocial needs;Provide feedback about the scores to participant    Expected Outcomes Short Term goal: Utilizing psychosocial counselor, staff and physician to assist with identification of specific Stressors or current issues interfering with healing process. Setting desired goal for each stressor or current issue identified.;Long Term Goal: Stressors or current issues are controlled or eliminated.;Short Term goal: Identification and review with participant of any Quality of Life or Depression concerns found by scoring the questionnaire.;Long Term goal: The participant improves quality of Life and PHQ9 Scores as seen by post scores and/or verbalization of changes             Quality of Life Scores:   Quality of Life - 02/02/23 1552       Quality of Life   Select Quality of Life      Quality of Life Scores   Health/Function Pre 28.6 %    Socioeconomic Pre 23.63 %    Psych/Spiritual Pre 30 %    Family Pre 30 %    GLOBAL Pre 27.94 %            Scores of 19 and below usually indicate a poorer quality of life in these areas.  A difference of  2-3 points is a clinically meaningful difference.  A difference of 2-3 points in the total score of the Quality of Life Index has been associated with significant improvement in overall quality of life, self-image, physical symptoms, and general health in studies assessing change in quality of life.  PHQ-9: Review Flowsheet  More data exists      02/21/2023 02/03/2023 02/02/2023 10/07/2022 06/20/2022  Depression screen PHQ 2/9  Decreased Interest 0 0 0 3 0  Down, Depressed,  Hopeless 0 0 0 0 0  PHQ - 2 Score 0 0 0 3 0  Altered sleeping 0 - 1 1 -  Tired, decreased energy 0 - 1 2 -  Change in appetite 0 - 0 0 -  Feeling bad or failure about yourself  0 - 0 0 -  Trouble concentrating 0 - 0 0 -  Moving slowly or fidgety/restless 0 - 0 0 -  Suicidal thoughts 0 - 0 0 -  PHQ-9 Score 0 - 2 6 -  Difficult doing work/chores Not difficult at all - Not difficult at all Not difficult at all -    Details           Interpretation of Total Score  Total Score Depression Severity:  1-4 = Minimal depression, 5-9 =  Mild depression, 10-14 = Moderate depression, 15-19 = Moderately severe depression, 20-27 = Severe depression   Psychosocial Evaluation and Intervention:  Psychosocial Evaluation - 01/25/23 1507       Psychosocial Evaluation & Interventions   Expected Outcomes STG [name] attends all scheduled sessions, he is able to progress with the exercise. LTG [name] continues to progress after discharge.    Continue Psychosocial Services  Follow up required by staff             Psychosocial Re-Evaluation:   Psychosocial Discharge (Final Psychosocial Re-Evaluation):   Vocational Rehabilitation: Provide vocational rehab assistance to qualifying candidates.   Vocational Rehab Evaluation & Intervention:  Vocational Rehab - 01/25/23 1449       Initial Vocational Rehab Evaluation & Intervention   Assessment shows need for Vocational Rehabilitation No      Vocational Rehab Re-Evaulation   Comments retired             Education: Education Goals: Education classes will be provided on a variety of topics geared toward better understanding of heart health and risk factor modification. Participant will state understanding/return demonstration of topics presented as noted by education test scores.  Learning Barriers/Preferences:  Learning Barriers/Preferences - 01/25/23 1449       Learning Barriers/Preferences   Learning Barriers None    Learning  Preferences None             General Cardiac Education Topics:  AED/CPR: - Group verbal and written instruction with the use of models to demonstrate the basic use of the AED with the basic ABC's of resuscitation.   Anatomy and Cardiac Procedures: - Group verbal and visual presentation and models provide information about basic cardiac anatomy and function. Reviews the testing methods done to diagnose heart disease and the outcomes of the test results. Describes the treatment choices: Medical Management, Angioplasty, or Coronary Bypass Surgery for treating various heart conditions including Myocardial Infarction, Angina, Valve Disease, and Cardiac Arrhythmias.  Written material given at graduation.   Medication Safety: - Group verbal and visual instruction to review commonly prescribed medications for heart and lung disease. Reviews the medication, class of the drug, and side effects. Includes the steps to properly store meds and maintain the prescription regimen.  Written material given at graduation.   Intimacy: - Group verbal instruction through game format to discuss how heart and lung disease can affect sexual intimacy. Written material given at graduation..   Know Your Numbers and Heart Failure: - Group verbal and visual instruction to discuss disease risk factors for cardiac and pulmonary disease and treatment options.  Reviews associated critical values for Overweight/Obesity, Hypertension, Cholesterol, and Diabetes.  Discusses basics of heart failure: signs/symptoms and treatments.  Introduces Heart Failure Zone chart for action plan for heart failure.  Written material given at graduation.   Infection Prevention: - Provides verbal and written material to individual with discussion of infection control including proper hand washing and proper equipment cleaning during exercise session. Flowsheet Row Cardiac Rehab from 02/02/2023 in [facility] Cardiac and Pulmonary Rehab  Date  02/02/23  Educator NT  Instruction Review Code 1- Verbalizes Understanding       Falls Prevention: - Provides verbal and written material to individual with discussion of falls prevention and safety. Flowsheet Row Cardiac Rehab from 02/02/2023 in [facility] Cardiac and Pulmonary Rehab  Date 01/25/23  Educator SB  Instruction Review Code 1- Verbalizes Understanding       Other: -Provides group and verbal instruction on various topics (see  comments)   Knowledge Questionnaire Score:  Knowledge Questionnaire Score - 02/02/23 1520       Knowledge Questionnaire Score   Pre Score 23/26             Core Components/Risk Factors/Patient Goals at Admission:  Personal Goals and Risk Factors at Admission - 01/25/23 1449       Core Components/Risk Factors/Patient Goals on Admission    Weight Management Yes    Intervention Weight Management: Develop a combined nutrition and exercise program designed to reach desired caloric intake, while maintaining appropriate intake of nutrient and fiber, sodium and fats, and appropriate energy expenditure required for the weight goal.;Weight Management: Provide education and appropriate resources to help participant work on and attain dietary goals.    Admit Weight 208 lb (94.3 kg)    Goal Weight: Long Term 208 lb (94.3 kg)    Expected Outcomes Short Term: Continue to assess and modify interventions until short term weight is achieved;Long Term: Adherence to nutrition and physical activity/exercise program aimed toward attainment of established weight goal;Weight Maintenance: Understanding of the daily nutrition guidelines, which includes 25-35% calories from fat, 7% or less cal from saturated fats, less than 200mg  cholesterol, less than 1.5gm of sodium, & 5 or more servings of fruits and vegetables daily    Heart Failure Yes    Intervention Provide a combined exercise and nutrition program that is supplemented with education, support and counseling about  heart failure. Directed toward relieving symptoms such as shortness of breath, decreased exercise tolerance, and extremity edema.    Expected Outcomes Improve functional capacity of life;Short term: Attendance in program 2-3 days a week with increased exercise capacity. Reported lower sodium intake. Reported increased fruit and vegetable intake. Reports medication compliance.;Short term: Daily weights obtained and reported for increase. Utilizing diuretic protocols set by physician.;Long term: Adoption of self-care skills and reduction of barriers for early signs and symptoms recognition and intervention leading to self-care maintenance.    Hypertension Yes    Intervention Provide education on lifestyle modifcations including regular physical activity/exercise, weight management, moderate sodium restriction and increased consumption of fresh fruit, vegetables, and low fat dairy, alcohol moderation, and smoking cessation.;Monitor prescription use compliance.    Expected Outcomes Short Term: Continued assessment and intervention until BP is < 140/86mm HG in hypertensive participants. < 130/47mm HG in hypertensive participants with diabetes, heart failure or chronic kidney disease.;Long Term: Maintenance of blood pressure at goal levels.    Lipids Yes    Intervention Provide education and support for participant on nutrition & aerobic/resistive exercise along with prescribed medications to achieve LDL 70mg , HDL >40mg .    Expected Outcomes Short Term: Participant states understanding of desired cholesterol values and is compliant with medications prescribed. Participant is following exercise prescription and nutrition guidelines.;Long Term: Cholesterol controlled with medications as prescribed, with individualized exercise RX and with personalized nutrition plan. Value goals: LDL < 70mg , HDL > 40 mg.             Education:Diabetes - Individual verbal and written instruction to review signs/symptoms of  diabetes, desired ranges of glucose level fasting, after meals and with exercise. Acknowledge that pre and post exercise glucose checks will be done for 3 sessions at entry of program.   Core Components/Risk Factors/Patient Goals Review:    Core Components/Risk Factors/Patient Goals at Discharge (Final Review):    ITP Comments:  ITP Comments     Row Name 01/25/23 1509 02/21/23 1447 02/25/23 1050 03/21/23 0849  ITP Comments Virtual orientation call completed today. he has an appointment on Date: 02/02/2023  for EP eval and gym Orientation.  Documentation of diagnosis can be found in [facility] Date: 01/13/2023 . 30 Day review completed. Medical Director ITP review done, changes made as directed, and signed approval by Medical Director.   new to program  attended EP orientation has not started yet. Remains absent for program called and asked to be discharged             Comments: Early Discharge ITP

## 2023-03-21 NOTE — Progress Notes (Signed)
Discharge Note for  ENNIO WOULARD     May 08, 1941           Packer never started the program after his orientation

## 2023-03-22 ENCOUNTER — Encounter: Payer: Self-pay | Admitting: *Deleted

## 2023-03-22 DIAGNOSIS — Z955 Presence of coronary angioplasty implant and graft: Secondary | ICD-10-CM

## 2023-03-22 NOTE — Progress Notes (Signed)
Cardiac Individual Treatment Plan  Patient Details  Name: [name] MRN: [id number] Date of Birth: 07/22/1941 Referring Provider:    Initial Encounter Date:   Visit Diagnosis: Status post coronary artery stent placement  Patient's Home Medications on Admission:  Current Outpatient Medications:    albuterol (PROVENTIL) (2.5 MG/3ML) 0.083% nebulizer solution, Take 3 mLs (2.5 mg total) by nebulization every 6 (six) hours as needed for wheezing or shortness of breath., Disp: 75 mL, Rfl: 12   albuterol (VENTOLIN HFA) 108 (90 Base) MCG/ACT inhaler, TAKE 2 PUFFS INTO LUNGS EVERY 6 HOURS ASNEEDED FOR WHEEZING OR SHORTNESS OF BREATH, Disp: 8.5 g, Rfl: 6   aspirin EC 81 MG EC tablet, Take 1 tablet (81 mg total) by mouth daily., Disp: 30 tablet, Rfl: 0   atorvastatin (LIPITOR) 20 MG tablet, TAKE 1 TABLET BY MOUTH DAILY, Disp: 90 tablet, Rfl: 1   azithromycin (ZITHROMAX) 250 MG tablet, Take 2 tablets (500 mg) on  Day 1,  followed by 1 tablet (250 mg) once daily on Days 2 through 5., Disp: 6 each, Rfl: 0   carvedilol (COREG) 3.125 MG tablet, Take 1 tablet (3.125 mg total) by mouth 2 (two) times daily., Disp: 180 tablet, Rfl: 3   Cholecalciferol (VITAMIN D3) 25 MCG (1000 UT) CAPS, Take 1 capsule (1,000 Units total) by mouth 2 (two) times a week., Disp: , Rfl:    clopidogrel (PLAVIX) 75 MG tablet, Take 1 tablet (75 mg total) by mouth daily with breakfast., Disp: 90 tablet, Rfl: 3   cyclobenzaprine (FLEXERIL) 5 MG tablet, Take 1 tablet (5 mg total) by mouth 3 (three) times daily as needed for muscle spasms., Disp: 30 tablet, Rfl: 0   docusate sodium (COLACE) 100 MG capsule, Take 1 capsule (100 mg total) by mouth daily as needed for mild constipation., Disp: , Rfl:    ezetimibe (ZETIA) 10 MG tablet, Take 1 tablet (10 mg total) by mouth daily., Disp: 90 tablet, Rfl: 3   famotidine (PEPCID) 20 MG tablet, Take 20 mg by mouth daily., Disp: , Rfl:    fluticasone (FLONASE) 50 MCG/ACT nasal spray, Place 1  spray into both nostrils daily as needed for allergies or rhinitis., Disp: , Rfl:    Fluticasone-Umeclidin-Vilant (TRELEGY ELLIPTA) 200-62.5-25 MCG/ACT AEPB, Inhale 1 puff into the lungs daily., Disp: 180 each, Rfl: 0   Fluticasone-Umeclidin-Vilant (TRELEGY ELLIPTA) 200-62.5-25 MCG/ACT AEPB, Inhale 1 puff into the lungs daily., Disp: , Rfl:    furosemide (LASIX) 40 MG tablet, Take 0.5 tablets (20 mg total) by mouth daily., Disp: 90 tablet, Rfl: 3   gabapentin (NEURONTIN) 600 MG tablet, Take 1 tablet (600 mg total) by mouth 3 (three) times daily., Disp: 270 tablet, Rfl: 4   HYDROcodone-acetaminophen (NORCO/VICODIN) 5-325 MG tablet, Take 1-2 tablets by mouth every 4 (four) hours as needed for moderate pain., Disp: 30 tablet, Rfl: 0   isosorbide mononitrate (IMDUR) 30 MG 24 hr tablet, Take 1 tablet (30 mg total) by mouth daily., Disp: 90 tablet, Rfl: 3   losartan (COZAAR) 25 MG tablet, TAKE 1 TABLET BY MOUTH DAILY, Disp: 90 tablet, Rfl: 0   montelukast (SINGULAIR) 10 MG tablet, Take 1 tablet (10 mg total) by mouth daily., Disp: 30 tablet, Rfl: 2   mupirocin ointment (BACTROBAN) 2 %, Apply 1 Application topically 2 (two) times daily., Disp: 22 g, Rfl: 0   nitroGLYCERIN (NITROSTAT) 0.4 MG SL tablet, Place 1 tablet (0.4 mg total) under the tongue every 5 (five) minutes as needed for chest pain., Disp: 25  tablet, Rfl: 3   polyethylene glycol (MIRALAX / GLYCOLAX) 17 g packet, Take 17 g by mouth daily as needed for mild constipation. 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  06/2019 Echo (in setting of COVID): EF 35-40%, mild LVH, g1 DD, glob HK; b. 09/2019 Echo: EF 50-55%, Gr1 DD; c. 03/2021 Echo: EF 50-55%, no rwma, mild LVH, GrI DD, nl RV fxn. RVSP 44.64mmHg. Mild-mod dil LA. Triv MR. Mild-mod Ao sclerosis.   Actinic keratosis    Anginal pain (HCC)    Aortic ectasia, abdominal (HCC)    a.) CT abd 01/01/2022: 2.9 cm infrarenal abdominal aortic ectasia   Bladder cancer (HCC) 2023   BPH (benign prostatic hyperplasia) 09/06/2007   CAD s/p CABG    a.) 1990 s/p MI--> CABG x 3; b.) LHC/PCI 2002 --> BMS (unk type) to LCX; c.) LHC 07/07/2015: 50% oLM-LM, 80% oRCA, 80% pRCA-1, 80% pRCA-2, 60% dLAD, 100% o-pLAD, 60% mLAD, 50% D2, LIMA-LAD, SVG-D2, SVG-RI --> med Rx; d.) MV 06/05/2020: fixed apical ant/apical defect. No sign ischemia; e.) MV 09/08/202: EF 52%, no ischemia.   Carotid arterial disease (HCC)    a.) 08/2016 Carotid U/S: <39% bilat; b.) carotid doppler 10/12/2022: mild-to-moderate (<50%) bilateral bifurcation plaque.   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   CKD (chronic kidney disease), stage III (HCC)    Community acquired pneumonia of right lower lobe of lung 06/05/2017   Diverticulosis    Dupuytren's contracture of right hand 10/29/2008   DVT (deep venous thrombosis) (HCC) 1998   Elevated prostate specific antigen (PSA) 07/09/2008   Emphysema of lung (HCC)    GERD 04/30/2007   Heart murmur    History of 2019 novel coronavirus disease (COVID-19) 05/20/2019   History of hiatal hernia    History of shingles    HLD (hyperlipidemia) 04/26/2007   HTN (hypertension) 04/30/2007   Laceration of skin of left hand 03/08/2018   LBBB (left bundle branch block)    Lumbar disc disease with radiculopathy    Migraines    Myocardial infarction (HCC) 1990   a.) resulted in 3v CABG   Myocardial infarction (HCC) 2002   a.) second cardiac event; PCI with placement of BMS (unknown type) to LCx   Neuropathy of both feet    OSA on CPAP     Osteoarthritis    PAD (peripheral artery disease) (HCC)    a. 07/2017 LE duplex: RSFA 75-27m, LSFA 75-14m, 50-74d; c. 08/2018 Periph Angio: No signif AoIliac dzs. Mod-sev Ca2+ RSFA w/ diff dzs throughout- 3 vessel runoff. Borderline signif LSFA dzs w/ mod-sev Ca2+ vessels and 3 vessel runoff below the knee-->Med rx.   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   Right mid dorsum forearm - in situ - EDC    Tobacco Use: Social History   Tobacco Use  Smoking Status Former   Current packs/day: 0.00   Average packs/day: 1 pack/day for 25.0 years (25.0 ttl pk-yrs)   Types: Cigarettes   Start date: 08/16/1963   Quit date: 08/15/1988   Years since quitting: 34.6   Passive exposure: Past  Smokeless Tobacco Never    Labs: Review Flowsheet  More data exists      Latest Ref Rng & Units 02/21/2022 10/07/2022 01/13/2023 02/03/2023 02/28/2023  Labs for ITP Cardiac and Pulmonary Rehab  Cholestrol 0 - 200 mg/dL 98  846  - - 962   LDL (calc) 0 - 99 mg/dL 49  49  - - 59   HDL-C >39.00 mg/dL 95.28  41.32  - - 44.01   Trlycerides 0.0 - 149.0 mg/dL 02.7  25.3  - - 66.4   Hemoglobin A1c 4.0 - 5.6 % 6.5  6.1  - 6.3  -  PH, Arterial 7.35 - 7.45 - - 7.357  - -  PCO2 arterial 32 - 48 mmHg - - 41.8  - -  Bicarbonate 20.0 - 28.0 mmol/L - - 24.4  23.4  - -  TCO2 22 - 32 mmol/L - - 26  25  - -  Acid-base deficit 0.0 - 2.0 mmol/L - - 1.0  2.0  - -  O2 Saturation % - - 64  86  - -    Details       Multiple values from one day are sorted in reverse-chronological order          Exercise Target Goals: Exercise Program Goal: Individual exercise prescription set using results from initial 6 min walk test and THRR while considering  patient's activity  barriers and safety.   Exercise Prescription Goal: Initial exercise prescription builds to 30-45 minutes a day of aerobic activity, 2-3 days per week.  Home exercise guidelines will be given to patient during program as part of exercise prescription that the participant will acknowledge.   Education: Aerobic Exercise: - Group verbal and visual presentation on the components of exercise prescription. Introduces F.I.T.T principle from ACSM for exercise prescriptions.  Reviews F.I.T.T. principles of aerobic exercise including progression. Written material given at graduation. Flowsheet Row Cardiac Rehab from 02/02/2023 in [facility] Cardiac and Pulmonary Rehab  Education need identified 02/02/23       Education: Resistance Exercise: - Group verbal and visual presentation on the components of exercise prescription. Introduces F.I.T.T principle from ACSM for exercise prescriptions  Reviews F.I.T.T. principles of resistance exercise including progression. Written material given at graduation.    Education: Exercise & Equipment Safety: - Individual verbal instruction and demonstration of equipment use and safety with use of the equipment. Flowsheet Row Cardiac Rehab from 02/02/2023 in [facility] Cardiac and Pulmonary Rehab  Date 02/02/23  Educator NT  Instruction Review Code 1- Verbalizes Understanding       Education: Exercise Physiology & General Exercise Guidelines: - Group verbal and written instruction with models to review the exercise physiology of the cardiovascular system and associated critical values. Provides general exercise guidelines with specific guidelines to those with heart or lung disease.    Education: Flexibility, Balance, Mind/Body Relaxation: - Group verbal and visual presentation with interactive activity on the components of exercise prescription. Introduces F.I.T.T principle from ACSM for exercise prescriptions. Reviews F.I.T.T. principles of flexibility and balance exercise  training including progression. Also  discusses the mind body connection.  Reviews various relaxation techniques to help reduce and manage stress (i.e. Deep breathing, progressive muscle relaxation, and visualization). Balance handout provided to take home. Written material given at graduation.   Activity Barriers & Risk Stratification:  Activity Barriers & Cardiac Risk Stratification - 01/25/23 1436       Activity Barriers & Cardiac Risk Stratification   Activity Barriers Back Problems;Left Hip Replacement;Right Hip Replacement;Right Knee Replacement;History of Falls   dizzy one time   has not happened since   Cardiac Risk Stratification Moderate             6 Minute Walk:   Oxygen Initial Assessment:   Oxygen Re-Evaluation:   Oxygen Discharge (Final Oxygen Re-Evaluation):   Initial Exercise Prescription:   Perform Capillary Blood Glucose checks as needed.  Exercise Prescription Changes:   Exercise Comments:   Exercise Goals and Review:   Exercise Goals     Row Name 02/02/23 1556             Exercise Goals   Increase Physical Activity Yes       Intervention Provide advice, education, support and counseling about physical activity/exercise needs.;Develop an individualized exercise prescription for aerobic and resistive training based on initial evaluation findings, risk stratification, comorbidities and participant's personal goals.       Expected Outcomes Short Term: Attend rehab on a regular basis to increase amount of physical activity.;Long Term: Add in home exercise to make exercise part of routine and to increase amount of physical activity.;Long Term: Exercising regularly at least 3-5 days a week.       Increase Strength and Stamina Yes       Intervention Provide advice, education, support and counseling about physical activity/exercise needs.;Develop an individualized exercise prescription for aerobic and resistive training based on initial evaluation  findings, risk stratification, comorbidities and participant's personal goals.       Expected Outcomes Short Term: Increase workloads from initial exercise prescription for resistance, speed, and METs.;Short Term: Perform resistance training exercises routinely during rehab and add in resistance training at home;Long Term: Improve cardiorespiratory fitness, muscular endurance and strength as measured by increased METs and functional capacity ( )       Able to understand and use rate of perceived exertion (RPE) scale Yes       Intervention Provide education and explanation on how to use RPE scale       Expected Outcomes Short Term: Able to use RPE daily in rehab to express subjective intensity level;Long Term:  Able to use RPE to guide intensity level when exercising independently       Able to understand and use Dyspnea scale Yes       Intervention Provide education and explanation on how to use Dyspnea scale       Expected Outcomes Short Term: Able to use Dyspnea scale daily in rehab to express subjective sense of shortness of breath during exertion;Long Term: Able to use Dyspnea scale to guide intensity level when exercising independently       Knowledge and understanding of Target Heart Rate Range (THRR) Yes       Intervention Provide education and explanation of THRR including how the numbers were predicted and where they are located for reference       Expected Outcomes Short Term: Able to state/look up THRR;Long Term: Able to use THRR to govern intensity when exercising independently;Short Term: Able to use daily as guideline for intensity in rehab  Able to check pulse independently Yes       Intervention Review the importance of being able to check your own pulse for safety during independent exercise;Provide education and demonstration on how to check pulse in carotid and radial arteries.       Expected Outcomes Short Term: Able to explain why pulse checking is important during  independent exercise;Long Term: Able to check pulse independently and accurately       Understanding of Exercise Prescription Yes       Intervention Provide education, explanation, and written materials on patient's individual exercise prescription       Expected Outcomes Short Term: Able to explain program exercise prescription;Long Term: Able to explain home exercise prescription to exercise independently                Exercise Goals Re-Evaluation :   Discharge Exercise Prescription (Final Exercise Prescription Changes):   Nutrition:  Target Goals: Understanding of nutrition guidelines, daily intake of sodium 1500mg , cholesterol 200mg , calories 30% from fat and 7% or less from saturated fats, daily to have 5 or more servings of fruits and vegetables.  Education: All About Nutrition: -Group instruction provided by verbal, written material, interactive activities, discussions, models, and posters to present general guidelines for heart healthy nutrition including fat, fiber, MyPlate, the role of sodium in heart healthy nutrition, utilization of the nutrition label, and utilization of this knowledge for meal planning. Follow up email sent as well. Written material given at graduation. Flowsheet Row Cardiac Rehab from 02/02/2023 in [facility] Cardiac and Pulmonary Rehab  Education need identified 02/02/23       Biometrics:  Pre Biometrics - 02/02/23 1556       Pre Biometrics   Height 6\' 1"  (1.854 m)    Weight 210 lb 4.8 oz (95.4 kg)    BMI (Calculated) 27.75              Nutrition Therapy Plan and Nutrition Goals:  Nutrition Therapy & Goals - 02/02/23 1552       Intervention Plan   Intervention Prescribe, educate and counsel regarding individualized specific dietary modifications aiming towards targeted core components such as weight, hypertension, lipid management, diabetes, heart failure and other comorbidities.    Expected Outcomes Short Term Goal: Understand basic  principles of dietary content, such as calories, fat, sodium, cholesterol and nutrients.;Short Term Goal: A plan has been developed with personal nutrition goals set during dietitian appointment.;Long Term Goal: Adherence to prescribed nutrition plan.             Nutrition Assessments:  MEDIFICTS Score Key: ?70 Need to make dietary changes  40-70 Heart Healthy Diet ? 40 Therapeutic Level Cholesterol Diet  Flowsheet Row Cardiac Rehab from 02/02/2023 in [facility] Cardiac and Pulmonary Rehab  Picture Your Plate Total Score on Admission 60      Picture Your Plate Scores: <40 Unhealthy dietary pattern with much room for improvement. 41-50 Dietary pattern unlikely to meet recommendations for good health and room for improvement. 51-60 More healthful dietary pattern, with some room for improvement.  >60 Healthy dietary pattern, although there may be some specific behaviors that could be improved.    Nutrition Goals Re-Evaluation:   Nutrition Goals Discharge (Final Nutrition Goals Re-Evaluation):   Psychosocial: Target Goals: Acknowledge presence or absence of significant depression and/or stress, maximize coping skills, provide positive support system. Participant is able to verbalize types and ability to use techniques and skills needed for reducing stress and depression.   Education: Stress,  Anxiety, and Depression - Group verbal and visual presentation to define topics covered.  Reviews how body is impacted by stress, anxiety, and depression.  Also discusses healthy ways to reduce stress and to treat/manage anxiety and depression.  Written material given at graduation.   Education: Sleep Hygiene -Provides group verbal and written instruction about how sleep can affect your health.  Define sleep hygiene, discuss sleep cycles and impact of sleep habits. Review good sleep hygiene tips.    Initial Review & Psychosocial Screening:  Initial Psych Review & Screening - 01/25/23 1436        Initial Review   Current issues with None Identified      Family Dynamics   Good Support System? Yes   wife, Harriett Sine   daughter     Barriers   Psychosocial barriers to participate in program There are no identifiable barriers or psychosocial needs.      Screening Interventions   Interventions Encouraged to exercise;To provide support and resources with identified psychosocial needs;Provide feedback about the scores to participant    Expected Outcomes Short Term goal: Utilizing psychosocial counselor, staff and physician to assist with identification of specific Stressors or current issues interfering with healing process. Setting desired goal for each stressor or current issue identified.;Long Term Goal: Stressors or current issues are controlled or eliminated.;Short Term goal: Identification and review with participant of any Quality of Life or Depression concerns found by scoring the questionnaire.;Long Term goal: The participant improves quality of Life and PHQ9 Scores as seen by post scores and/or verbalization of changes             Quality of Life Scores:   Quality of Life - 02/02/23 1552       Quality of Life   Select Quality of Life      Quality of Life Scores   Health/Function Pre 28.6 %    Socioeconomic Pre 23.63 %    Psych/Spiritual Pre 30 %    Family Pre 30 %    GLOBAL Pre 27.94 %            Scores of 19 and below usually indicate a poorer quality of life in these areas.  A difference of  2-3 points is a clinically meaningful difference.  A difference of 2-3 points in the total score of the Quality of Life Index has been associated with significant improvement in overall quality of life, self-image, physical symptoms, and general health in studies assessing change in quality of life.  PHQ-9: Review Flowsheet  More data exists      02/21/2023 02/03/2023 02/02/2023 10/07/2022 06/20/2022  Depression screen PHQ 2/9  Decreased Interest 0 0 0 3 0  Down, Depressed,  Hopeless 0 0 0 0 0  PHQ - 2 Score 0 0 0 3 0  Altered sleeping 0 - 1 1 -  Tired, decreased energy 0 - 1 2 -  Change in appetite 0 - 0 0 -  Feeling bad or failure about yourself  0 - 0 0 -  Trouble concentrating 0 - 0 0 -  Moving slowly or fidgety/restless 0 - 0 0 -  Suicidal thoughts 0 - 0 0 -  PHQ-9 Score 0 - 2 6 -  Difficult doing work/chores Not difficult at all - Not difficult at all Not difficult at all -    Details           Interpretation of Total Score  Total Score Depression Severity:  1-4 = Minimal depression, 5-9 =  Mild depression, 10-14 = Moderate depression, 15-19 = Moderately severe depression, 20-27 = Severe depression   Psychosocial Evaluation and Intervention:  Psychosocial Evaluation - 01/25/23 1507       Psychosocial Evaluation & Interventions   Expected Outcomes STG [name] attends all scheduled sessions, he is able to progress with the exercise. LTG [name] continues to progress after discharge.    Continue Psychosocial Services  Follow up required by staff             Psychosocial Re-Evaluation:   Psychosocial Discharge (Final Psychosocial Re-Evaluation):   Vocational Rehabilitation: Provide vocational rehab assistance to qualifying candidates.   Vocational Rehab Evaluation & Intervention:  Vocational Rehab - 01/25/23 1449       Initial Vocational Rehab Evaluation & Intervention   Assessment shows need for Vocational Rehabilitation No      Vocational Rehab Re-Evaulation   Comments retired             Education: Education Goals: Education classes will be provided on a variety of topics geared toward better understanding of heart health and risk factor modification. Participant will state understanding/return demonstration of topics presented as noted by education test scores.  Learning Barriers/Preferences:  Learning Barriers/Preferences - 01/25/23 1449       Learning Barriers/Preferences   Learning Barriers None    Learning  Preferences None             General Cardiac Education Topics:  AED/CPR: - Group verbal and written instruction with the use of models to demonstrate the basic use of the AED with the basic ABC's of resuscitation.   Anatomy and Cardiac Procedures: - Group verbal and visual presentation and models provide information about basic cardiac anatomy and function. Reviews the testing methods done to diagnose heart disease and the outcomes of the test results. Describes the treatment choices: Medical Management, Angioplasty, or Coronary Bypass Surgery for treating various heart conditions including Myocardial Infarction, Angina, Valve Disease, and Cardiac Arrhythmias.  Written material given at graduation.   Medication Safety: - Group verbal and visual instruction to review commonly prescribed medications for heart and lung disease. Reviews the medication, class of the drug, and side effects. Includes the steps to properly store meds and maintain the prescription regimen.  Written material given at graduation.   Intimacy: - Group verbal instruction through game format to discuss how heart and lung disease can affect sexual intimacy. Written material given at graduation..   Know Your Numbers and Heart Failure: - Group verbal and visual instruction to discuss disease risk factors for cardiac and pulmonary disease and treatment options.  Reviews associated critical values for Overweight/Obesity, Hypertension, Cholesterol, and Diabetes.  Discusses basics of heart failure: signs/symptoms and treatments.  Introduces Heart Failure Zone chart for action plan for heart failure.  Written material given at graduation.   Infection Prevention: - Provides verbal and written material to individual with discussion of infection control including proper hand washing and proper equipment cleaning during exercise session. Flowsheet Row Cardiac Rehab from 02/02/2023 in [facility] Cardiac and Pulmonary Rehab  Date  02/02/23  Educator NT  Instruction Review Code 1- Verbalizes Understanding       Falls Prevention: - Provides verbal and written material to individual with discussion of falls prevention and safety. Flowsheet Row Cardiac Rehab from 02/02/2023 in [facility] Cardiac and Pulmonary Rehab  Date 01/25/23  Educator SB  Instruction Review Code 1- Verbalizes Understanding       Other: -Provides group and verbal instruction on various topics (see  comments)   Knowledge Questionnaire Score:  Knowledge Questionnaire Score - 02/02/23 1520       Knowledge Questionnaire Score   Pre Score 23/26             Core Components/Risk Factors/Patient Goals at Admission:  Personal Goals and Risk Factors at Admission - 01/25/23 1449       Core Components/Risk Factors/Patient Goals on Admission    Weight Management Yes    Intervention Weight Management: Develop a combined nutrition and exercise program designed to reach desired caloric intake, while maintaining appropriate intake of nutrient and fiber, sodium and fats, and appropriate energy expenditure required for the weight goal.;Weight Management: Provide education and appropriate resources to help participant work on and attain dietary goals.    Admit Weight 208 lb (94.3 kg)    Goal Weight: Long Term 208 lb (94.3 kg)    Expected Outcomes Short Term: Continue to assess and modify interventions until short term weight is achieved;Long Term: Adherence to nutrition and physical activity/exercise program aimed toward attainment of established weight goal;Weight Maintenance: Understanding of the daily nutrition guidelines, which includes 25-35% calories from fat, 7% or less cal from saturated fats, less than 200mg  cholesterol, less than 1.5gm of sodium, & 5 or more servings of fruits and vegetables daily    Heart Failure Yes    Intervention Provide a combined exercise and nutrition program that is supplemented with education, support and counseling about  heart failure. Directed toward relieving symptoms such as shortness of breath, decreased exercise tolerance, and extremity edema.    Expected Outcomes Improve functional capacity of life;Short term: Attendance in program 2-3 days a week with increased exercise capacity. Reported lower sodium intake. Reported increased fruit and vegetable intake. Reports medication compliance.;Short term: Daily weights obtained and reported for increase. Utilizing diuretic protocols set by physician.;Long term: Adoption of self-care skills and reduction of barriers for early signs and symptoms recognition and intervention leading to self-care maintenance.    Hypertension Yes    Intervention Provide education on lifestyle modifcations including regular physical activity/exercise, weight management, moderate sodium restriction and increased consumption of fresh fruit, vegetables, and low fat dairy, alcohol moderation, and smoking cessation.;Monitor prescription use compliance.    Expected Outcomes Short Term: Continued assessment and intervention until BP is < 140/28mm HG in hypertensive participants. < 130/23mm HG in hypertensive participants with diabetes, heart failure or chronic kidney disease.;Long Term: Maintenance of blood pressure at goal levels.    Lipids Yes    Intervention Provide education and support for participant on nutrition & aerobic/resistive exercise along with prescribed medications to achieve LDL 70mg , HDL >40mg .    Expected Outcomes Short Term: Participant states understanding of desired cholesterol values and is compliant with medications prescribed. Participant is following exercise prescription and nutrition guidelines.;Long Term: Cholesterol controlled with medications as prescribed, with individualized exercise RX and with personalized nutrition plan. Value goals: LDL < 70mg , HDL > 40 mg.             Education:Diabetes - Individual verbal and written instruction to review signs/symptoms of  diabetes, desired ranges of glucose level fasting, after meals and with exercise. Acknowledge that pre and post exercise glucose checks will be done for 3 sessions at entry of program.   Core Components/Risk Factors/Patient Goals Review:    Core Components/Risk Factors/Patient Goals at Discharge (Final Review):    ITP Comments:  ITP Comments     Row Name 01/25/23 1509 02/21/23 1447 02/25/23 1050 03/21/23 0849  ITP Comments Virtual orientation call completed today. he has an appointment on Date: 02/02/2023  for EP eval and gym Orientation.  Documentation of diagnosis can be found in [facility] Date: 01/13/2023 . 30 Day review completed. Medical Director ITP review done, changes made as directed, and signed approval by Medical Director.   new to program  attended EP orientation has not started yet. Remains absent for program called and asked to be discharged             Comments: Discharge ITP

## 2023-03-23 ENCOUNTER — Ambulatory Visit: Payer: HMO | Admitting: Physical Therapy

## 2023-03-23 ENCOUNTER — Ambulatory Visit: Payer: HMO

## 2023-03-26 NOTE — Progress Notes (Unsigned)
Cardiology Office Note  Date:  03/27/2023   ID:  [name], DOB August 17, 1940, MRN [id number]  PCP:  Eustaquio Boyden, MD   Chief Complaint  Patient presents with   Follow-up    1 month follow up - Meds reviewed verbally with patient.     HPI:  82 year old gentleman with hx of OSA, did not tolerate CPAP mild  bilateral carotid arterial disease  <39% bilaterally 08/2016 hyperlipidemia  coronary artery disease,  bypass in 1990,  stenting to the circumflex in 2002,  cath 06/2015, patent grafts x 2, RCA small vessel diffuse disease DVT in the left lower extremity,  Completed back surgery for spinal stenosis, 06/20/2016 Cervical neck surgery 01/2017, Kathlen Mody  total knee surgery in 02/2017 covid 05/20/2019 EF was 35 to 40% in 06/2019 in setting of covid 10/20 Repeat echo February 2021  ejection fraction 50 to 55% COPD/asthma who presents for routine followup  Of his coronary artery disease, post  Covid, bilateral SFA disease, cardiomyopathy  Last seen by myself in clinic July 2024 EF 40 to 45% Cardiac catheterization Jan 13, 2023 Angioplasty and stent placement to the mid to distal left circumflex Lasix was increased up to 40  BID for moderately elevated LV end-diastolic pressure  Cut back to lasix 20 daily for renal dysfunction Weight at home 205-206 Renal function improved  In follow-up today denies significant shortness of breath, no fluid retention, no leg swelling Has been relatively sedentary, feels tired Restarting rehab on legs tomorrow Has also been in touch with cardiac rehab  Lots of bruising from aspirin Plavix  History of COPD exacerbation/bronchitis Echo 4/24: EF 40 to 45%,  down from 50 to 55% in 2022  Hx of Left retinal artery occlusion Cadence ordered a 2 week heart monitor Could not wear, sweating too much  cardiac cath 01/13/2023. This showed left dominant coronary arteries with severe three-vessel CAD. Patent grafts including LIMA to LAD,  SVG to first and second diagonals, patent mid left circumflex stent. He was noted to have subtotal occlusion of the mid to distal left circumflex with collaterals from LAD. Moderately reduced LV SF. Right heart cath showed mildly elevated RA pressure, moderate pulmonary hypertension, moderately to severely elevated wedge pressure with normal cardiac output. Patient was treated with DES placement to the mid/distal left circumflex. He was started on dual antiplatelet therapy with aspirin and Plavix   Seen by Dr.Arida for lower extremity PAD in October 2023, medical management recommended  Carotid u/s Mild-to-moderate bilateral carotid bifurcation plaque with Doppler measurements indicative of less than 50% stenosis.   Hx of Bladder cancer, completed 6 treatments  Stress test September 2022 Low risk, probably normal pharmacologic myocardial perfusion stress test.  LE arterial hx, reviewed today 2.  Right lower extremity: Moderately to severely calcified SFA with diffuse disease throughout its course especially in the midsegment and three-vessel runoff below the knee. 3.  Left lower extremity: Borderline significant SFA disease which is moderately to severely calcified and three-vessel runoff below the knee.  pulmonary appointment sept, 2021  postinflammatory pulmonary issues post Covid COPD poorly compensated Changes to  his inhalers made  CT chest 06/2019 Diffuse multifocal areas of consolidation and ground-glass opacity throughout the lungs compatible with atypical infection in the setting of known COVID positivity.  Stress test 03/2019 Pharmacological myocardial perfusion imaging study with no significant ischemia Small region mild fixed perfusion defect in the distal anteroseptal and apical region, unable to exclude old scar vs attenuation artifact EF estimated at 38%,  septal wall hypokinesis, possibly secondary to CABG/post-operative state.  No EKG changes concerning for ischemia at  peak stress or in recovery. Low risk scan  05/2019 COVID , and hospital 10 days treated with remdesivir and steroids, antibodies little bit of leg swelling but he attributed to not taking diuretics   Back in the hospital again for an additional 10-day stay October 28  he started to run another fever up to 101.3 has not had any chest pain or wheezing no headaches. blood pressure has been  running low in the 90s so he stopped using his lisinopril.  His blood pressure after that improved to 130s over 60s  CT chest,  . Diffuse multifocal areas of consolidation and ground-glass opacity throughout the lungs compatible with atypical infection in the setting of known COVID positivity.  LE arterial 08/2018,   Right lower extremity: Moderately to severely calcified SFA with diffuse disease throughout its course especially in the midsegment and three-vessel runoff below the knee.   AAA less than 3 cm, on scan in 2018 reviewed with him Numerous previous episode of chest pain relieved with belching consistent with GI etiology.  Previous cardiac catheterization 07/07/2015            Ost LM to LM lesion, 50% stenosed.            Ost RCA lesion, 80% stenosed.            Prox RCA-1 lesion, 80% stenosed.            Prox RCA-2 lesion, 80% stenosed.            LIMA was injected is moderate in size, and is anatomically normal.            Dist LAD lesion, 60% stenosed.            Ost LAD to Prox LAD lesion, 100% stenosed.            Mid LAD lesion, 60% stenosed.            The left ventricular systolic function is normal.            SVG was injected is normal in caliber, and is anatomically normal.            2nd Diag lesion, 50% stenosed.    severe RCA disease (small vessel) not amenable to PCI, moderate left main disease, moderate LAD and proximal diagonal disease. 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), CKD (chronic kidney disease), stage III (HCC), Community acquired pneumonia of right lower lobe of lung (06/05/2017), Diverticulosis, Dupuytren's contracture of right hand (10/29/2008), DVT (deep venous thrombosis) (HCC) (1998), Elevated prostate specific antigen (PSA) (07/09/2008), Emphysema of lung (HCC), GERD (04/30/2007), Heart murmur, History of 2019 novel coronavirus disease (COVID-19) (05/20/2019), History of hiatal hernia, History of shingles, HLD (hyperlipidemia) (04/26/2007), HTN (hypertension) (04/30/2007), Laceration of skin of left hand (03/08/2018), LBBB (left bundle branch block), Lumbar disc disease with radiculopathy, Migraines, Myocardial infarction (HCC) (1990), Myocardial infarction (HCC) (2002), Neuropathy of both feet, OSA on CPAP, Osteoarthritis, PAD (peripheral artery disease) (HCC), Pneumonia (07/2014), Pneumonia due to COVID-19 virus (05/30/2019), Pre-diabetes, Prostatitis, chronic, Right  inguinal hernia (05/26/2010), S/P CABG x 3 (1990), Spinal stenosis of lumbar region, and 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  PSH:    Past Surgical History:  Procedure Laterality Date   ABDOMINAL AORTOGRAM W/LOWER EXTREMITY [name] 09/12/2018   Procedure: ABDOMINAL AORTOGRAM W/LOWER EXTREMITY;  Surgeon: Iran Ouch, MD;  Location: [facility];  Service: Cardiovascular;  Laterality: [name];   ANTERIOR CERVICAL DECOMP/DISCECTOMY FUSION [name] 07/11/2022   Anterior Cervical Decompression Fusion ,Interboy Prothesis,Plate/Screws , Cervical four-five,Cervical five-six [name], Tinnie Gens, MD)   APPENDECTOMY     rupture   BACK SURGERY     1984 and then another one  [facility]   BLADDER INSTILLATION [name] 10/19/2021   Procedure: BLADDER INSTILLATION OF GEMCITABINE;  Surgeon: Riki Altes, MD;  Location: [facility];  Service: Urology;  Laterality: [name];   BLADDER INSTILLATION [name] 11/15/2022   Procedure: BLADDER INSTILLATION OF GEMCITABINE;  Surgeon: Riki Altes, MD;  Location: [facility];  Service: Urology;  Laterality: [name];   CARDIAC CATHETERIZATION [name] 07/07/2015   Procedure: Left Heart Cath and Cors/Grafts Angiography;  Surgeon: Antonieta Iba, MD;  Location: [facility];  Service: Cardiovascular;  Laterality: [name];   CATARACT EXTRACTION, BILATERAL  2009   CERVICAL SPINE SURGERY  12/2016   cervical stenosis [name])   COLONOSCOPY  03/2010   HP polyp, diverticulosis, rpt 10 yrs ([name])   CORONARY ANGIOPLASTY  11/30/2000   LCX   CORONARY ARTERY BYPASS GRAFT  1990   3 vessel    CORONARY STENT INTERVENTION [name] 01/13/2023   Procedure: CORONARY STENT INTERVENTION;  Surgeon: Iran Ouch, MD;  Location: [facility];  Service: Cardiovascular;  Laterality: [name];   CYSTOSCOPY  12/23/2010   [facility]   JOINT REPLACEMENT     KNEE ARTHROSCOPY Right    x2   LAMINOTOMY  1986   L5/S1 lumbar laminotomy for two ruptured discs/fusion   LUMBAR LAMINECTOMY/DECOMPRESSION MICRODISCECTOMY [name] 07/13/2016   Procedure: LUMBAR TWO-THREE, LUMBAR THREE-FOUR, LUMBAR FOUR-FIVE LAMINECTOMY AND FORAMINOTOMY;  Surgeon: Tressie Stalker, MD;  Location: [facility];  Service: Neurosurgery;  Laterality: [name];  LAMINECTOMY AND FORAMINOTOMY L2-L3, L3-L4,L4-L5   LUMBAR SPINE SURGERY  06/2020   Bilateral redo laminectomy/laminotomy/foraminotomies/medial facetectomy to decompress the bilateral L4 and L5 nerve roots ([facility])   RIGHT/LEFT HEART CATH AND CORONARY ANGIOGRAPHY Bilateral 01/13/2023   Procedure: RIGHT/LEFT HEART CATH AND CORONARY ANGIOGRAPHY;  Surgeon: Iran Ouch, MD;  Location: [facility];  Service: Cardiovascular;  Laterality: Bilateral;   TOTAL HIP  ARTHROPLASTY Bilateral 1999,2000   TOTAL KNEE ARTHROPLASTY Right 03/06/2017   Procedure: RIGHT TOTAL KNEE ARTHROPLASTY;  Surgeon: Ollen Gross, MD;  Location: [facility];  Service: Orthopedics;  Laterality: Right;   TRANSURETHRAL RESECTION OF BLADDER TUMOR [name] 10/19/2021   Procedure: TRANSURETHRAL RESECTION OF BLADDER TUMOR (TURBT);  Surgeon: Riki Altes, MD;  Location: [facility];  Service: Urology;  Laterality: [name];   TRANSURETHRAL RESECTION OF BLADDER TUMOR [name] 11/15/2022   Procedure: TRANSURETHRAL RESECTION OF BLADDER TUMOR (TURBT);  Surgeon: Riki Altes, MD;  Location: [facility];  Service: Urology;  Laterality: [name];    Current Outpatient Medications  Medication Sig Dispense Refill   albuterol (PROVENTIL) (2.5 MG/3ML) 0.083% nebulizer solution Take 3 mLs (2.5 mg total) by nebulization every 6 (six) hours as needed for wheezing or shortness of breath. 75 mL 12   albuterol (VENTOLIN HFA) 108 (90 Base) MCG/ACT inhaler TAKE 2 PUFFS INTO LUNGS EVERY 6 HOURS ASNEEDED FOR WHEEZING OR SHORTNESS OF BREATH 8.5  g 6   aspirin EC 81 MG EC tablet Take 1 tablet (81 mg total) by mouth daily. 30 tablet 0   atorvastatin (LIPITOR) 20 MG tablet TAKE 1 TABLET BY MOUTH DAILY 90 tablet 1   carvedilol (COREG) 3.125 MG tablet Take 1 tablet (3.125 mg total) by mouth 2 (two) times daily. 180 tablet 3   Cholecalciferol (VITAMIN D3) 25 MCG (1000 UT) CAPS Take 1 capsule (1,000 Units total) by mouth 2 (two) times a week.     clopidogrel (PLAVIX) 75 MG tablet Take 1 tablet (75 mg total) by mouth daily with breakfast. 90 tablet 3   cyclobenzaprine (FLEXERIL) 5 MG tablet Take 1 tablet (5 mg total) by mouth 3 (three) times daily as needed for muscle spasms. 30 tablet 0   docusate sodium (COLACE) 100 MG capsule Take 1 capsule (100 mg total) by mouth daily as needed for mild constipation.     ezetimibe (ZETIA) 10 MG tablet Take 1 tablet (10 mg total) by mouth daily. 90 tablet 3   famotidine (PEPCID) 20 MG tablet Take 20 mg  by mouth daily.     fluticasone (FLONASE) 50 MCG/ACT nasal spray Place 1 spray into both nostrils daily as needed for allergies or rhinitis.     Fluticasone-Umeclidin-Vilant (TRELEGY ELLIPTA) 200-62.5-25 MCG/ACT AEPB Inhale 1 puff into the lungs daily. 180 each 0   Fluticasone-Umeclidin-Vilant (TRELEGY ELLIPTA) 200-62.5-25 MCG/ACT AEPB Inhale 1 puff into the lungs daily.     furosemide (LASIX) 40 MG tablet Take 0.5 tablets (20 mg total) by mouth daily. 90 tablet 3   gabapentin (NEURONTIN) 600 MG tablet Take 1 tablet (600 mg total) by mouth 3 (three) times daily. 270 tablet 4   HYDROcodone-acetaminophen (NORCO/VICODIN) 5-325 MG tablet Take 1-2 tablets by mouth every 4 (four) hours as needed for moderate pain. 30 tablet 0   isosorbide mononitrate (IMDUR) 30 MG 24 hr tablet Take 1 tablet (30 mg total) by mouth daily. 90 tablet 3   losartan (COZAAR) 25 MG tablet TAKE 1 TABLET BY MOUTH DAILY 90 tablet 0   montelukast (SINGULAIR) 10 MG tablet Take 1 tablet (10 mg total) by mouth daily. 30 tablet 2   mupirocin ointment (BACTROBAN) 2 % Apply 1 Application topically 2 (two) times daily. 22 g 0   nitroGLYCERIN (NITROSTAT) 0.4 MG SL tablet Place 1 tablet (0.4 mg total) under the tongue every 5 (five) minutes as needed for chest pain. 25 tablet 3   polyethylene glycol (MIRALAX / GLYCOLAX) 17 g packet Take 17 g by mouth daily as needed for mild constipation.      ranolazine (RANEXA) 500 MG 12 hr tablet TAKE ONE (1) TABLET BY MOUTH TWO TIMES PER DAY 180 tablet 3   silodosin (RAPAFLO) 8 MG CAPS capsule TAKE ONE CAPSULE BY MOUTH DAILY WITH BREAKFAST. 30 capsule 0   Vibegron (GEMTESA) 75 MG TABS Take 1 tablet (75 mg total) by mouth daily. 42 tablet 0   No current facility-administered medications for this visit.    Allergies:   Vioxx [rofecoxib], Spiriva respimat [tiotropium bromide monohydrate], Contrast media [iodinated contrast media], and Morphine   Social History:  The patient  reports that he quit smoking  about 34 years ago. His smoking use included cigarettes. He started smoking about 59 years ago. He has a 25 pack-year smoking history. He has been exposed to tobacco smoke. He has never used smokeless tobacco. He reports that he does not drink alcohol and does not use drugs.   Family History:  family history includes Cancer in his sister and another family member; Diabetes in his brother and mother; Heart attack in his mother; Heart disease in his father; Polycystic kidney disease in his father and sister; Stroke in his father and mother.   Review of Systems: Review of Systems  Constitutional: Negative.   HENT: Negative.    Respiratory: Negative.    Cardiovascular: Negative.   Gastrointestinal: Negative.   Musculoskeletal: Negative.   Skin:        Bruising  Neurological: Negative.   Psychiatric/Behavioral: Negative.    All other systems reviewed and are negative.   PHYSICAL EXAM: VS:  BP 130/68 (BP Location: Left Arm, Patient Position: Sitting, Cuff Size: Normal)   Pulse 63   Ht 6' (1.829 m)   Wt 209 lb (94.8 kg)   SpO2 96%   BMI 28.35 kg/m  , BMI Body mass index is 28.35 kg/m. Constitutional:  oriented to person, place, and time. No distress.  HENT:  Head: Grossly normal Eyes:  no discharge. No scleral icterus.  Neck: No JVD, no carotid bruits  Cardiovascular: Regular rate and rhythm, no murmurs appreciated Pulmonary/Chest: Clear to auscultation bilaterally, no wheezes or rails Abdominal: Soft.  no distension.  no tenderness.  Musculoskeletal: Normal range of motion Neurological:  normal muscle tone. Coordination normal. No atrophy Skin: Skin warm and dry Psychiatric: normal affect, pleasant  Recent Labs: 01/27/2023: Hemoglobin 13.8; Platelets 182 02/28/2023: ALT 16; BUN 31; Creatinine, Ser 1.28; Potassium 4.2; Sodium 138    Lipid Panel Lab Results  Component Value Date   CHOL 117 02/28/2023   HDL 43.50 02/28/2023   LDLCALC 59 02/28/2023   TRIG 69.0 02/28/2023       Wt Readings from Last 3 Encounters:  03/27/23 209 lb (94.8 kg)  03/16/23 208 lb 3.2 oz (94.4 kg)  03/07/23 208 lb 4 oz (94.5 kg)     ASSESSMENT AND PLAN:  Cardiomyopathy,  Low ejection fraction in the setting of COVID in 2020 recovered, EF up to 50 -55% by echo 09/2019, same in 2022,  Drop in ejection fraction 40 to 45% in the setting of recent bronchitis Stress test positive, Stent placed to LCX, No significant chest pain concerning for angina continue carvedilol, Lasix 20 daily, isosorbide 30 daily Hold off on ACE, ARB, Entresto in the setting of renal dysfunction Extra Lasix for weight 208 or higher.  Current weight at home 204-205  Cad with stable angina Recent stent placement to left circumflex on aspirin Plavix  Covid , 05/2019 With associated cardiomyopathy, drop in ejection fraction recovered,  now with mild drop again after recent bronchitis  COPD/emphysema Followed by pulmonary Prior smoking history, Covid 2020  Mixed hyperlipidemia Cholesterol is at goal on the current lipid regimen. No changes to the medications were made.  Essential hypertension Blood pressure is well controlled on today's visit. No changes made to the medications.  Bilateral carotid artery stenosis Mild bilateral carotid disease less than 39% bilaterally Cholesterol at goal  Diet-controlled diabetes mellitus (HCC) Well-controlled, A1C 6.3  PAD Follows with Dr. Kirke Corin ,  bilateral SFA disease No claudications Medical management has been recommended   Total encounter time more than 30 minutes  Greater than 50% was spent in counseling and coordination of care with the patient   No orders of the defined types were placed in this encounter.     Signed, Dossie Arbour, M.D., Ph.D. 03/27/2023  [facility] [place], [place] [phone]

## 2023-03-27 ENCOUNTER — Encounter: Payer: Self-pay | Admitting: Cardiovascular Disease

## 2023-03-27 ENCOUNTER — Ambulatory Visit: Payer: HMO | Attending: Cardiovascular Disease | Admitting: Cardiovascular Disease

## 2023-03-27 VITALS — BP 130/68 | HR 63 | Ht 72.0 in | Wt 209.0 lb

## 2023-03-27 DIAGNOSIS — I739 Peripheral vascular disease, unspecified: Secondary | ICD-10-CM | POA: Diagnosis not present

## 2023-03-27 DIAGNOSIS — E782 Mixed hyperlipidemia: Secondary | ICD-10-CM

## 2023-03-27 DIAGNOSIS — I10 Essential (primary) hypertension: Secondary | ICD-10-CM

## 2023-03-27 DIAGNOSIS — I25118 Atherosclerotic heart disease of native coronary artery with other forms of angina pectoris: Secondary | ICD-10-CM | POA: Diagnosis not present

## 2023-03-27 DIAGNOSIS — I5022 Chronic systolic (congestive) heart failure: Secondary | ICD-10-CM | POA: Diagnosis not present

## 2023-03-27 DIAGNOSIS — E785 Hyperlipidemia, unspecified: Secondary | ICD-10-CM

## 2023-03-27 DIAGNOSIS — I255 Ischemic cardiomyopathy: Secondary | ICD-10-CM

## 2023-03-27 DIAGNOSIS — I779 Disorder of arteries and arterioles, unspecified: Secondary | ICD-10-CM | POA: Diagnosis not present

## 2023-03-27 DIAGNOSIS — H349 Unspecified retinal vascular occlusion: Secondary | ICD-10-CM

## 2023-03-27 MED ORDER — FUROSEMIDE 40 MG PO TABS: 20.0000 mg | ORAL_TABLET | Freq: Every day | ORAL | 3 refills | Status: DC

## 2023-03-27 NOTE — Patient Instructions (Addendum)
Medication Instructions:  Stay on lasix 20 daily Increase lasix up to 40 mg for weight 208 at home   If you need a refill on your cardiac medications before your next appointment, please call your pharmacy.   Lab work: No new labs needed  Testing/Procedures: No new testing needed  Follow-Up: At [facility], you and your health needs are our priority.  As part of our continuing mission to provide you with exceptional heart care, we have created designated Provider Care Teams.  These Care Teams include your primary Cardiologist (physician) and Advanced Practice Providers (APPs -  Physician Assistants and Nurse Practitioners) who all work together to provide you with the care you need, when you need it.  You will need a follow up appointment in 6 months, APP ok  Providers on your designated Care Team:   Nicolasa Ducking, NP Eula Listen, PA-C Cadence Fransico [place], [place]  COVID-19 Vaccine Information can be found at: [email or web address] For questions related to vaccine distribution or appointments, please email [email or web address] or call [phone].

## 2023-03-28 ENCOUNTER — Ambulatory Visit: Payer: HMO | Attending: Family Medicine | Admitting: Physical Therapy

## 2023-03-28 ENCOUNTER — Ambulatory Visit: Payer: HMO

## 2023-03-28 ENCOUNTER — Encounter: Payer: Self-pay | Admitting: Physical Therapy

## 2023-03-28 DIAGNOSIS — M25552 Pain in left hip: Secondary | ICD-10-CM | POA: Insufficient documentation

## 2023-03-28 DIAGNOSIS — M25551 Pain in right hip: Secondary | ICD-10-CM | POA: Insufficient documentation

## 2023-03-28 NOTE — Therapy (Signed)
OUTPATIENT PHYSICAL THERAPY LOWER EXTREMITY TREATMENT   Patient Name: [name] MRN: [id number] DOB:07-02-1941, 82 y.o., male Today's Date: 03/06/2023   PT End of Session - 03/28/23 1430     Visit Number 4    Number of Visits 20    Date for PT Re-Evaluation 05/15/23    Authorization Type HTA [id number]    Authorization Time Period 03/06/2023-05/15/2023    Authorization - Visit Number 4    Authorization - Number of Visits 20    Progress Note Due on Visit 10    PT Start Time 1430    PT Stop Time 1515    PT Time Calculation (min) 45 min    Activity Tolerance Patient tolerated treatment well    Behavior During Therapy [facility] for tasks assessed/performed                  Past Medical History:  Diagnosis Date   (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 06/2019 Echo (in setting of COVID): EF 35-40%, mild LVH, g1 DD, glob HK; b. 09/2019 Echo: EF 50-55%, Gr1 DD; c. 03/2021 Echo: EF 50-55%, no rwma, mild LVH, GrI DD, nl RV fxn. RVSP 44.57mmHg. Mild-mod dil LA. Triv MR. Mild-mod Ao sclerosis.   Actinic keratosis    Anginal pain (HCC)    Aortic ectasia, abdominal (HCC)    a.) CT abd 01/01/2022: 2.9 cm infrarenal abdominal aortic ectasia   Bladder cancer (HCC) 2023   BPH (benign prostatic hyperplasia) 09/06/2007   CAD s/p CABG    a.) 1990 s/p MI--> CABG x 3; b.) LHC/PCI 2002 --> BMS (unk type) to LCX; c.) LHC 07/07/2015: 50% oLM-LM, 80% oRCA, 80% pRCA-1, 80% pRCA-2, 60% dLAD, 100% o-pLAD, 60% mLAD, 50% D2, LIMA-LAD, SVG-D2, SVG-RI --> med Rx; d.) MV 06/05/2020: fixed apical ant/apical defect. No sign ischemia; e.) MV 09/08/202: EF 52%, no ischemia.   Carotid arterial disease (HCC)    a.) 08/2016 Carotid U/S: <39% bilat; b.) carotid doppler 10/12/2022: mild-to-moderate (<50%) bilateral bifurcation plaque.   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   CKD (chronic kidney disease), stage III (HCC)    Community acquired pneumonia of  right lower lobe of lung 06/05/2017   Diverticulosis    Dupuytren's contracture of right hand 10/29/2008   DVT (deep venous thrombosis) (HCC) 1998   Elevated prostate specific antigen (PSA) 07/09/2008   Emphysema of lung (HCC)    GERD 04/30/2007   Heart murmur    History of 2019 novel coronavirus disease (COVID-19) 05/20/2019   History of hiatal hernia    History of shingles    HLD (hyperlipidemia) 04/26/2007   HTN (hypertension) 04/30/2007   Laceration of skin of left hand 03/08/2018   LBBB (left bundle branch block)    Lumbar disc disease with radiculopathy    Migraines    Myocardial infarction (HCC) 1990   a.) resulted in 3v CABG   Myocardial infarction (HCC) 2002   a.) second cardiac event; PCI with placement of BMS (unknown type) to LCx   Neuropathy of both feet    OSA on CPAP    Osteoarthritis    PAD (peripheral artery disease) (HCC)    a. 07/2017 LE duplex: RSFA 75-42m, LSFA 75-30m, 50-74d; c. 08/2018 Periph Angio: No signif AoIliac dzs. Mod-sev Ca2+ RSFA w/ diff dzs throughout- 3 vessel runoff. Borderline signif LSFA dzs w/ mod-sev Ca2+ vessels and 3 vessel runoff below the knee-->Med rx.   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   Right mid dorsum forearm - in situ - EDC   Past Surgical History:  Procedure Laterality Date   ABDOMINAL AORTOGRAM W/LOWER EXTREMITY [name] 09/12/2018   Procedure: ABDOMINAL AORTOGRAM W/LOWER EXTREMITY;  Surgeon: Iran Ouch, MD;  Location: [facility];  Service: Cardiovascular;  Laterality: [name];   ANTERIOR CERVICAL DECOMP/DISCECTOMY FUSION [name] 07/11/2022   Anterior Cervical Decompression Fusion ,Interboy Prothesis,Plate/Screws , Cervical four-five,Cervical five-six  [name], Tinnie Gens, MD)   APPENDECTOMY     rupture   BACK SURGERY     1984 and then another one [facility]   BLADDER INSTILLATION [name] 10/19/2021   Procedure: BLADDER INSTILLATION OF GEMCITABINE;  Surgeon: Riki Altes, MD;  Location: [facility];  Service: Urology;  Laterality: [name];   BLADDER INSTILLATION [name] 11/15/2022   Procedure: BLADDER INSTILLATION OF GEMCITABINE;  Surgeon: Riki Altes, MD;  Location: [facility];  Service: Urology;  Laterality: [name];   CARDIAC CATHETERIZATION [name] 07/07/2015   Procedure: Left Heart Cath and Cors/Grafts Angiography;  Surgeon: Antonieta Iba, MD;  Location: [facility];  Service: Cardiovascular;  Laterality: [name];   CATARACT EXTRACTION, BILATERAL  2009   CERVICAL SPINE SURGERY  12/2016   cervical stenosis [name])   COLONOSCOPY  03/2010   HP polyp, diverticulosis, rpt 10 yrs ([name])   CORONARY ANGIOPLASTY  11/30/2000   LCX   CORONARY ARTERY BYPASS GRAFT  1990   3 vessel    CORONARY STENT INTERVENTION [name] 01/13/2023   Procedure: CORONARY STENT INTERVENTION;  Surgeon: Iran Ouch, MD;  Location: [facility];  Service: Cardiovascular;  Laterality: [name];   CYSTOSCOPY  12/23/2010   [facility]   JOINT REPLACEMENT     KNEE ARTHROSCOPY Right    x2   LAMINOTOMY  1986   L5/S1 lumbar laminotomy for two ruptured discs/fusion   LUMBAR LAMINECTOMY/DECOMPRESSION MICRODISCECTOMY [name] 07/13/2016   Procedure: LUMBAR TWO-THREE, LUMBAR THREE-FOUR, LUMBAR FOUR-FIVE LAMINECTOMY AND FORAMINOTOMY;  Surgeon: Tressie Stalker, MD;  Location: [facility];  Service: Neurosurgery;  Laterality: [name];  LAMINECTOMY AND FORAMINOTOMY L2-L3, L3-L4,L4-L5   LUMBAR SPINE SURGERY  06/2020   Bilateral redo laminectomy/laminotomy/foraminotomies/medial facetectomy to decompress the bilateral L4 and L5 nerve roots ([place])   RIGHT/LEFT HEART CATH AND CORONARY ANGIOGRAPHY Bilateral 01/13/2023   Procedure: RIGHT/LEFT HEART CATH AND CORONARY ANGIOGRAPHY;  Surgeon: Iran Ouch, MD;   Location: [facility];  Service: Cardiovascular;  Laterality: Bilateral;   TOTAL HIP ARTHROPLASTY Bilateral 1999,2000   TOTAL KNEE ARTHROPLASTY Right 03/06/2017   Procedure: RIGHT TOTAL KNEE ARTHROPLASTY;  Surgeon: Ollen Gross, MD;  Location: [facility];  Service: Orthopedics;  Laterality: Right;   TRANSURETHRAL RESECTION OF BLADDER TUMOR [name] 10/19/2021   Procedure: TRANSURETHRAL RESECTION OF BLADDER TUMOR (TURBT);  Surgeon: Riki Altes, MD;  Location: [facility];  Service: Urology;  Laterality: [name];   TRANSURETHRAL RESECTION OF BLADDER TUMOR [name] 11/15/2022   Procedure: TRANSURETHRAL RESECTION OF BLADDER TUMOR (TURBT);  Surgeon: Riki Altes, MD;  Location: [facility];  Service: Urology;  Laterality: [name];   Patient Active Problem List   Diagnosis Date Noted   Injury of left ankle 02/03/2023   Abnormal stress test 01/14/2023  Angina pectoris (HCC) 01/13/2023   Acute on chronic systolic heart failure (HCC) 01/13/2023   Traumatic open wound of lower leg with delayed healing, left 12/28/2022   Retinal artery occlusion, branch, 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   Cervical spondylosis with radiculopathy 07/11/2022   Right low back pain 12/29/2021   Bladder tumor 11/12/2021   OSA (obstructive sleep apnea)    Chronic heart failure with preserved ejection fraction (HFpEF) (HCC)    Right leg pain 06/22/2021   Nocturnal hypoxia 06/11/2021   CKD (chronic kidney disease) stage 3, GFR 30-59 ml/min (HCC) 02/24/2021   Peripheral neuropathy 02/23/2021   Spondylolisthesis of lumbar region 07/07/2020   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   Coccydynia 09/20/2019   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   Anxiety 06/04/2019   Chronic right shoulder pain 09/10/2018   Cervical stenosis of spinal canal 06/05/2017   Pedal edema 06/05/2017   Health maintenance examination 12/06/2016   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/13/2016   Overweight (BMI 25.0-29.9) 07/04/2016   Low  vitamin B12 level 01/01/2016   Exertional dyspnea 07/07/2015   Unstable angina (HCC)    Medicare annual wellness visit, subsequent 07/03/2015   Advanced care planning/counseling discussion 07/03/2015   Type 2 diabetes mellitus with other specified complication (HCC) 05/04/2015   Pleuritic chest pain 05/04/2015   Ex-smoker 05/04/2015   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   Spermatocele 04/02/2013   Syncopal vertigo 11/04/2010   Lumbar disc disease with radiculopathy 11/04/2010   Coronary artery disease involving native coronary artery of native heart with unstable angina pectoris (HCC) 08/11/2009   PAD (peripheral artery disease) (HCC) 08/11/2009   DUPUYTREN'S CONTRACTURE, 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 Lora Havens   REFERRING PROVIDER: Dr. Freddrick March   REFERRING DIAG: Left Hip Pain, Left Hamstring Strain   THERAPY DIAG:  Pain in left hip  Rationale for Evaluation and Treatment: Rehabilitation  ONSET DATE: 03/05/2021  SUBJECTIVE:   SUBJECTIVE STATEMENT: Pt has had several accidents since last session including twisting his left ankle in his truck and he had a cut from lifting a barrel. His neuropathy has also been bothering him a lot lately.   PERTINENT HISTORY: Pt reports hearing a pop and pain in left hip after carrying a two by four. He has an extensive cardiac history having a stent placement due to blocked arteries. Per chart, he last saw PT in 2022 where it was reported that he was diagnosed with bladder cancer and he had to delay treatment for proximal hamstring strain. He finished up last PT session in October of 2023 with improvement in  proximal left hamstring pain but he has since started to have hamstring pain again and he no longer follows his home exercise plan.  PAIN:  Are you having pain? Yes: NPRS scale: 5/10 Pain location: Central Spinous process of L1-L3 and left ischial tuberosity   Pain description: Achy Aggravating factors: Bending forward with knee extension  Relieving factors: Not bending forward   PRECAUTIONS: None  RED FLAGS: None   WEIGHT BEARING RESTRICTIONS: No  FALLS:  Has patient fallen in last 6 months? Yes. Number of falls 1, tripped while walking outside in the rain  LIVING ENVIRONMENT: Lives  with: lives with their spouse Lives in: House/apartment Stairs: Yes: External: 5 steps; on right going up and on left going up Has following equipment at home: Single point cane, Walker - 2 wheeled, Environmental consultant - 4 wheeled, and scooter   OCCUPATION: Retired   PLOF: Independent  PATIENT GOALS: Feel less pain in left hip   NEXT MD VISIT: Nothing scheduled with Dr. Nelson Chimes   OBJECTIVE:   BP 124/53 HR 65 SpO2 97  DIAGNOSTIC FINDINGS: No imaging of left hip   PATIENT SURVEYS:  FOTO 48/100 with target of 57  COGNITION: Overall cognitive status: Within functional limits for tasks assessed     SENSATION: WFL    MUSCLE LENGTH: Hamstrings: Right 80 deg; Left 80 deg Thomas test: NT   POSTURE: rounded shoulders and forward head  PALPATION: Right Ischial Tuberosity  LOWER EXTREMITY ROM:  WFL   LUMBAR: All WFL with no pain with exception of forward flexion   LOWER EXTREMITY MMT:   MMT  Right eval Left eval  Hip flexion 4+ 4+  Hip extension NT  NT   Hip abduction 4 4  Hip adduction    Hip internal rotation    Hip external rotation    Knee flexion 4 4*  Knee extension 4 4  Ankle dorsiflexion 4+ 4+  Ankle plantarflexion    Ankle inversion    Ankle eversion      LOWER EXTREMITY SPECIAL TESTS:  Hip special tests: Luisa Hart (FABER) test: negative, Ober's test: negative, and FADIR:  negative bilateral   GAIT: Distance walked: 50 ft  Assistive device utilized: None Level of assistance: Complete Independence Comments: Decreased step length bilaterally, but no other gait deficits noted    TODAY'S TREATMENT:                                                                                                                              DATE:   03/28/23: Nu-Step with seat at 9 and resistance at 1 for 5 min  OMEGA Eccentric HS on LLE #25 lb 3 x 10  Standing HS curls with BUE support #10 AW 1 x 10  Standing HS curls with 1 UE support #10 AW 1 x 10  Standing HS curls with no UE support #10 AW 1 x 10   Lower Trunk Rotation with feet on silver ball 1 x 10   03/13/23: Nu-Step with seat at 11 and resistance at 3 for 5 min  Seated Forward Flexion 3 x 30 sec  AAROM Lumbar Flexion Right 1 x 10 with 3 sec hold with silver ball roll  AAROM Lumbar Flexion Left 1 x 10 with 3 sec hold with silver ball roll  1 RM OMEGA HS Curl R/L 30/25 Standing Hip Extension with sliders under heel of sliding foot and BUE support 3 x 10  Half Deadlift with water jug 3 x 10  -mod VC to maintain slight knee bend and to generate motion at hips  MANUAL  L1-L2 central spinous process- AP grade III X 20     03/08/23: Matrix Recumbent Bike at 13 seat for 3 min -Pt reports severe left proximal hamstring pain  Nu-Step seat 13 for 6 min  OMEGA Eccentric HS Curl #10 on LLE 1 x 10  OMEGA Eccentric HS Curl #15 on LLE 1 x 10  OMEGA Eccentric HS Curl #20 on LLE 1 x 10  Hip Hinge with pole for TC cueing 3 x 10  -min VC to decrease speed of coming up  Sit to Stand from 18 inches 3 x 10  -min VC to decrease eccentric speed of exercise    03/06/23  Isometric Hamstring Supine Bridge with 5 sec hold 1 x 10    PATIENT EDUCATION:  Education details: form and technique for correct performance of exercise  Person educated: Patient Education method: Programmer, multimedia, Demonstration, Verbal cues, and  Handouts Education comprehension: verbalized understanding and returned demonstration  HOME EXERCISE PROGRAM: Access Code: [email or web address] URL: [email or web address] Date: 03/28/2023 Prepared by: Ellin Goodie  Exercises - Supine Single Knee to Chest Stretch  - 1 x daily - 10 reps - 3 sec  hold - Sit to Stand  - 3-4 x weekly - 3 sets - 10 reps - Half Deadlift with Kettlebell  - 3-4 x weekly - 3 sets - 10 reps - Standing Knee Flexion AROM with Chair Support  - 3-4 x weekly - 3 sets - 10 reps  ASSESSMENT:  CLINICAL IMPRESSION: Despite recent injuries from near falls, pt was not limited by pain during the session. He was able to perform all exercises without an increase in his left hip pain and no increase in low back pain. Pt did have an increase in his low back pain with lumbar rotation with lunges, which were ended due to pain response. Exercise modified to include supine lumbar external rotation exercise on physioball which pt was able to perform without pain. He will continue to benefit from skilled PT to reduce left proximal hamstring pain with activity and to regain left hamstring strength to perform bending and lifting tasks required for gardening and home upkeep.    OBJECTIVE IMPAIRMENTS: decreased strength and pain.   ACTIVITY LIMITATIONS: lifting, bending, and squatting  PARTICIPATION LIMITATIONS: yard work  PERSONAL FACTORS: Age, Time since onset of injury/illness/exacerbation, and 3+ comorbidities: CHF, h/o MI, COPD  are also affecting patient's functional outcome.   REHAB POTENTIAL: Good  CLINICAL DECISION MAKING: Stable/uncomplicated  EVALUATION COMPLEXITY: Low   GOALS: Goals reviewed with patient? No  SHORT TERM GOALS: Target date: 03/20/2023  Pt will be independent with HEP in order to improve strength and balance in order to decrease fall risk and improve function at home and work. Baseline: Not performed independently yet 03/08/23: Performing Exercises  independently  Goal status: ONGOING     LONG TERM GOALS: Target date: 05/15/2023  Pt will demonstrate 1RM hamstring curl knee flex of LLE of >=90% of RLE for improved  strength to complete heavy outdoor ADLs  Baseline: OMEGA HS Curl R/L 30/25  Goal status: ONGOING   2.  Patient will have improved function and activity level as evidenced by an increase in FOTO score by 10 points or more.  Baseline: 48/100 with target of 57  Goal status: ONGOING   3.  Patient will be show a reduction in NRPS of >=2 pts of left proximal hamstring as evidence of improved healing and function.  Baseline: 5/10 NRPS  Goal status: ONGOING  PLAN:  PT FREQUENCY: 1-2x/week  PT DURATION: 10 weeks  PLANNED INTERVENTIONS: Therapeutic exercises, Neuromuscular re-education, Gait training, Joint mobilization, Joint manipulation, Stair training, Aquatic Therapy, Dry Needling, Spinal manipulation, Spinal mobilization, Cryotherapy, Moist heat, Manual therapy, and Re-evaluation  PLAN FOR NEXT SESSION: Continue to progress HS Strengthening exercises: Bridges with marches, Deadlifts, etc   Ellin Goodie PT, DPT  [facility] [address], [place], [address] Phone: [phone]   Fax:  [phone]

## 2023-03-30 ENCOUNTER — Ambulatory Visit: Payer: HMO | Admitting: Physical Therapy

## 2023-03-30 ENCOUNTER — Ambulatory Visit: Payer: HMO

## 2023-04-04 ENCOUNTER — Ambulatory Visit: Payer: HMO

## 2023-04-06 ENCOUNTER — Ambulatory Visit: Payer: HMO

## 2023-04-07 ENCOUNTER — Encounter: Payer: Self-pay | Admitting: Pulmonary Disease

## 2023-04-11 ENCOUNTER — Ambulatory Visit: Payer: HMO

## 2023-04-12 ENCOUNTER — Encounter: Payer: Self-pay | Admitting: Urology

## 2023-04-12 ENCOUNTER — Ambulatory Visit (INDEPENDENT_AMBULATORY_CARE_PROVIDER_SITE_OTHER): Payer: HMO | Admitting: Urology

## 2023-04-12 VITALS — BP 135/66 | HR 75 | Ht 72.0 in | Wt 209.0 lb

## 2023-04-12 DIAGNOSIS — N3941 Urge incontinence: Secondary | ICD-10-CM | POA: Diagnosis not present

## 2023-04-12 DIAGNOSIS — Z8551 Personal history of malignant neoplasm of bladder: Secondary | ICD-10-CM

## 2023-04-12 LAB — URINALYSIS, COMPLETE
Bilirubin, UA: NEGATIVE
Glucose, UA: NEGATIVE
Ketones, UA: NEGATIVE
Leukocytes,UA: NEGATIVE
Nitrite, UA: NEGATIVE
Protein,UA: NEGATIVE
RBC, UA: NEGATIVE
Specific Gravity, UA: 1.01 (ref 1.005–1.030)
Urobilinogen, Ur: 0.2 mg/dL (ref 0.2–1.0)
pH, UA: 5 (ref 5.0–7.5)

## 2023-04-12 LAB — MICROSCOPIC EXAMINATION: Bacteria, UA: NONE SEEN

## 2023-04-12 NOTE — Progress Notes (Signed)
   04/12/23  CC:  Chief Complaint  Patient presents with   Cysto   Urologic history: 1.  Ta high-grade urothelial carcinoma bladder Incidentally noted on pelvic MRI ordered by orthopedics to have a 15 x 20 mm bladder mass near right UO; cystoscopy with 3 cm papillary tumor TURBT 10/19/2025 with high-grade tumor and a low-grade tumor left posterolateral wall Induction BCG x6 completed 01/20/2022 Maintenance BCG x 3: 05/23/2022 MR urogram 03/2022 showed no upper tract abnormalities TURBT 11/2022 for a recurrent papillary tumor on surveillance; path high-grade Ta urothelial carcinoma Reinduction BCG x 6 completed 02/15/2023  2.  BPH with LUTS Cystoscopy moderate lateral lobe enlargement/bladder neck elevation On tamsulosin   HPI: Tolerated reinduction BCG without problems.  No complaints today.  Denies dysuria, gross hematuria  Blood pressure 135/74, pulse 90, height 6' (1.829 m), weight 210 lb (95.3 kg).   Cystoscopy Procedure Note  Patient identification was confirmed, informed consent was obtained, and patient was prepped using Betadine solution.  Lidocaine jelly was administered per urethral meatus.     Pre-Procedure: - Inspection reveals a normal caliber urethral meatus.  Procedure: The flexible cystoscope was introduced without difficulty - No urethral strictures/lesions are present. - Prostate moderate lateral lobe enlargement -  Moderate elevation  bladder neck - Bilateral ureteral orifices identified - Bladder mucosa  reveals no ulcers, tumors, or lesions - Site of last tumor location without abnormalities today - No bladder stones - Mild/moderate trabeculation  Retroflexion shows no abnormalities/tumor   Post-Procedure: - Patient tolerated the procedure well  Assessment/ Plan: No evidence recurrent tumor He would like to proceed with maintenance BCG and will be due for a 3-week course October 2024 Surveillance cystoscopy 3 months   Riki Altes, MD

## 2023-04-13 ENCOUNTER — Ambulatory Visit: Payer: HMO

## 2023-04-18 ENCOUNTER — Ambulatory Visit: Payer: HMO

## 2023-04-20 ENCOUNTER — Ambulatory Visit: Payer: HMO

## 2023-04-25 ENCOUNTER — Ambulatory Visit: Payer: HMO

## 2023-04-27 ENCOUNTER — Ambulatory Visit: Payer: HMO

## 2023-05-01 ENCOUNTER — Other Ambulatory Visit: Payer: Self-pay | Admitting: Cardiovascular Disease

## 2023-05-02 ENCOUNTER — Ambulatory Visit: Payer: HMO

## 2023-05-03 ENCOUNTER — Other Ambulatory Visit: Payer: Self-pay | Admitting: Cardiovascular Disease

## 2023-05-03 ENCOUNTER — Ambulatory Visit: Payer: HMO | Attending: Family Medicine | Admitting: Physical Therapy

## 2023-05-03 ENCOUNTER — Encounter: Payer: Self-pay | Admitting: Physical Therapy

## 2023-05-03 DIAGNOSIS — M25551 Pain in right hip: Secondary | ICD-10-CM | POA: Insufficient documentation

## 2023-05-03 DIAGNOSIS — M25552 Pain in left hip: Secondary | ICD-10-CM | POA: Insufficient documentation

## 2023-05-03 NOTE — Therapy (Signed)
OUTPATIENT PHYSICAL THERAPY LOWER EXTREMITY DISCHARGE   Patient Name: [name] MRN: [id number] DOB:21-Mar-1941, 82 y.o., male Today's Date: 03/06/2023   PT End of Session - 05/03/23 1118     Visit Number 5    Number of Visits 20    Date for PT Re-Evaluation 05/15/23    Authorization Type HTA [id number]    Authorization Time Period 03/06/2023-05/15/2023    Authorization - Visit Number 5    Authorization - Number of Visits 20    Progress Note Due on Visit 10    PT Start Time 1115    PT Stop Time 1200    PT Time Calculation (min) 45 min    Activity Tolerance Patient tolerated treatment well    Behavior During Therapy [facility] for tasks assessed/performed                  Past Medical History:  Diagnosis Date   (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 06/2019 Echo (in setting of COVID): EF 35-40%, mild LVH, g1 DD, glob HK; b. 09/2019 Echo: EF 50-55%, Gr1 DD; c. 03/2021 Echo: EF 50-55%, no rwma, mild LVH, GrI DD, nl RV fxn. RVSP 44.81mmHg. Mild-mod dil LA. Triv MR. Mild-mod Ao sclerosis.   Actinic keratosis    Anginal pain (HCC)    Aortic ectasia, abdominal (HCC)    a.) CT abd 01/01/2022: 2.9 cm infrarenal abdominal aortic ectasia   Bladder cancer (HCC) 2023   BPH (benign prostatic hyperplasia) 09/06/2007   CAD s/p CABG    a.) 1990 s/p MI--> CABG x 3; b.) LHC/PCI 2002 --> BMS (unk type) to LCX; c.) LHC 07/07/2015: 50% oLM-LM, 80% oRCA, 80% pRCA-1, 80% pRCA-2, 60% dLAD, 100% o-pLAD, 60% mLAD, 50% D2, LIMA-LAD, SVG-D2, SVG-RI --> med Rx; d.) MV 06/05/2020: fixed apical ant/apical defect. No sign ischemia; e.) MV 09/08/202: EF 52%, no ischemia.   Carotid arterial disease (HCC)    a.) 08/2016 Carotid U/S: <39% bilat; b.) carotid doppler 10/12/2022: mild-to-moderate (<50%) bilateral bifurcation plaque.   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   CKD (chronic kidney disease), stage III (HCC)    Community acquired pneumonia of  right lower lobe of lung 06/05/2017   Diverticulosis    Dupuytren's contracture of right hand 10/29/2008   DVT (deep venous thrombosis) (HCC) 1998   Elevated prostate specific antigen (PSA) 07/09/2008   Emphysema of lung (HCC)    GERD 04/30/2007   Heart murmur    History of 2019 novel coronavirus disease (COVID-19) 05/20/2019   History of hiatal hernia    History of shingles    HLD (hyperlipidemia) 04/26/2007   HTN (hypertension) 04/30/2007   Laceration of skin of left hand 03/08/2018   LBBB (left bundle branch block)    Lumbar disc disease with radiculopathy    Migraines    Myocardial infarction (HCC) 1990   a.) resulted in 3v CABG   Myocardial infarction (HCC) 2002   a.) second cardiac event; PCI with placement of BMS (unknown type) to LCx   Neuropathy of both feet    OSA on CPAP    Osteoarthritis    PAD (peripheral artery disease) (HCC)    a. 07/2017 LE duplex: RSFA 75-49m, LSFA 75-80m, 50-74d; c. 08/2018 Periph Angio: No signif AoIliac dzs. Mod-sev Ca2+ RSFA w/ diff dzs throughout- 3 vessel runoff. Borderline signif LSFA dzs w/ mod-sev Ca2+ vessels and 3 vessel runoff below the knee-->Med rx.   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   Right mid dorsum forearm - in situ - EDC   Past Surgical History:  Procedure Laterality Date   ABDOMINAL AORTOGRAM W/LOWER EXTREMITY [name] 09/12/2018   Procedure: ABDOMINAL AORTOGRAM W/LOWER EXTREMITY;  Surgeon: Iran Ouch, MD;  [facility]: [facility];  Service: Cardiovascular;  Laterality: [name];   ANTERIOR CERVICAL DECOMP/DISCECTOMY FUSION [name] 07/11/2022   Anterior Cervical Decompression Fusion ,Interboy Prothesis,Plate/Screws , Cervical four-five,Cervical five-six  [name], Tinnie Gens, MD)   APPENDECTOMY     rupture   BACK SURGERY     1984 and then another one [facility]   BLADDER INSTILLATION [name] 10/19/2021   Procedure: BLADDER INSTILLATION OF GEMCITABINE;  Surgeon: Riki Altes, MD;  Location: [facility];  Service: Urology;  Laterality: [name];   BLADDER INSTILLATION [name] 11/15/2022   Procedure: BLADDER INSTILLATION OF GEMCITABINE;  Surgeon: Riki Altes, MD;  Location: [facility];  Service: Urology;  Laterality: [name];   CARDIAC CATHETERIZATION [name] 07/07/2015   Procedure: Left Heart Cath and Cors/Grafts Angiography;  Surgeon: Antonieta Iba, MD;  Location: [facility];  Service: Cardiovascular;  Laterality: [name];   CATARACT EXTRACTION, BILATERAL  2009   CERVICAL SPINE SURGERY  12/2016   cervical stenosis [name])   COLONOSCOPY  03/2010   HP polyp, diverticulosis, rpt 10 yrs ([name])   CORONARY ANGIOPLASTY  11/30/2000   LCX   CORONARY ARTERY BYPASS GRAFT  1990   3 vessel    CORONARY STENT INTERVENTION [name] 01/13/2023   Procedure: CORONARY STENT INTERVENTION;  Surgeon: Iran Ouch, MD;  Location: [facility];  Service: Cardiovascular;  Laterality: [name];   CYSTOSCOPY  12/23/2010   [facility]   JOINT REPLACEMENT     KNEE ARTHROSCOPY Right    x2   LAMINOTOMY  1986   L5/S1 lumbar laminotomy for two ruptured discs/fusion   LUMBAR LAMINECTOMY/DECOMPRESSION MICRODISCECTOMY [name] 07/13/2016   Procedure: LUMBAR TWO-THREE, LUMBAR THREE-FOUR, LUMBAR FOUR-FIVE LAMINECTOMY AND FORAMINOTOMY;  Surgeon: Tressie Stalker, MD;  Location: [facility];  Service: Neurosurgery;  Laterality: [name];  LAMINECTOMY AND FORAMINOTOMY L2-L3, L3-L4,L4-L5   LUMBAR SPINE SURGERY  06/2020   Bilateral redo laminectomy/laminotomy/foraminotomies/medial facetectomy to decompress the bilateral L4 and L5 nerve roots ([place])   RIGHT/LEFT HEART CATH AND CORONARY ANGIOGRAPHY Bilateral 01/13/2023   Procedure: RIGHT/LEFT HEART CATH AND CORONARY ANGIOGRAPHY;  Surgeon: Iran Ouch, MD;   Location: [facility];  Service: Cardiovascular;  Laterality: Bilateral;   TOTAL HIP ARTHROPLASTY Bilateral 1999,2000   TOTAL KNEE ARTHROPLASTY Right 03/06/2017   Procedure: RIGHT TOTAL KNEE ARTHROPLASTY;  Surgeon: Ollen Gross, MD;  Location: [facility];  Service: Orthopedics;  Laterality: Right;   TRANSURETHRAL RESECTION OF BLADDER TUMOR [name] 10/19/2021   Procedure: TRANSURETHRAL RESECTION OF BLADDER TUMOR (TURBT);  Surgeon: Riki Altes, MD;  Location: [facility];  Service: Urology;  Laterality: [name];   TRANSURETHRAL RESECTION OF BLADDER TUMOR [name] 11/15/2022   Procedure: TRANSURETHRAL RESECTION OF BLADDER TUMOR (TURBT);  Surgeon: Riki Altes, MD;  Location: [facility];  Service: Urology;  Laterality: [name];   Patient Active Problem List   Diagnosis Date Noted   Injury of left ankle 02/03/2023   Abnormal stress test 01/14/2023  Angina pectoris (HCC) 01/13/2023   Acute on chronic systolic heart failure (HCC) 01/13/2023   Traumatic open wound of lower leg with delayed healing, left 12/28/2022   Retinal artery occlusion, branch, 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   Cervical spondylosis with radiculopathy 07/11/2022   Right low back pain 12/29/2021   Bladder tumor 11/12/2021   OSA (obstructive sleep apnea)    Chronic heart failure with preserved ejection fraction (HFpEF) (HCC)    Right leg pain 06/22/2021   Nocturnal hypoxia 06/11/2021   CKD (chronic kidney disease) stage 3, GFR 30-59 ml/min (HCC) 02/24/2021   Peripheral neuropathy 02/23/2021   Spondylolisthesis of lumbar region 07/07/2020   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   Coccydynia 09/20/2019   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   Anxiety 06/04/2019   Chronic right shoulder pain 09/10/2018   Cervical stenosis of spinal canal 06/05/2017   Pedal edema 06/05/2017   Health maintenance examination 12/06/2016   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/13/2016   Overweight (BMI 25.0-29.9) 07/04/2016   Low  vitamin B12 level 01/01/2016   Exertional dyspnea 07/07/2015   Unstable angina (HCC)    Medicare annual wellness visit, subsequent 07/03/2015   Advanced care planning/counseling discussion 07/03/2015   Type 2 diabetes mellitus with other specified complication (HCC) 05/04/2015   Pleuritic chest pain 05/04/2015   Ex-smoker 05/04/2015   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   Spermatocele 04/02/2013   Syncopal vertigo 11/04/2010   Lumbar disc disease with radiculopathy 11/04/2010   Coronary artery disease involving native coronary artery of native heart with unstable angina pectoris (HCC) 08/11/2009   PAD (peripheral artery disease) (HCC) 08/11/2009   DUPUYTREN'S CONTRACTURE, 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 Lora Havens   REFERRING PROVIDER: Dr. Freddrick March   REFERRING DIAG: Left Hip Pain, Left Hamstring Strain   THERAPY DIAG:  Pain in left hip  Rationale for Evaluation and Treatment: Rehabilitation  ONSET DATE: 03/05/2021  SUBJECTIVE:   SUBJECTIVE STATEMENT: Pt says that he has been doing well as of late and he has not had much hamstring pain. He is returning after a 1 month hiatus because of COVID exposure and difficulty getting appointments because of availability.   PERTINENT HISTORY: Pt reports hearing a pop and pain in left hip after carrying a two by four. He has an extensive cardiac history having a stent placement due to blocked arteries. Per chart, he last saw PT in 2022 where it was reported that he was diagnosed with bladder cancer and he had to delay treatment for proximal hamstring strain. He finished up last PT session in October of 2023  with improvement in proximal left hamstring pain but he has since started to have hamstring pain again and he no longer follows his home exercise plan.  PAIN:  Are you having pain? Yes: NPRS scale: 5/10 Pain location: Central Spinous process of L1-L3 and left ischial tuberosity   Pain description: Achy Aggravating factors: Bending forward with knee extension  Relieving factors: Not bending forward   PRECAUTIONS: None  RED FLAGS: None   WEIGHT BEARING RESTRICTIONS: No  FALLS:  Has patient fallen in last 6 months? Yes. Number of falls 1, tripped while walking outside in the rain  LIVING ENVIRONMENT: Lives with: lives with their spouse Lives in: House/apartment Stairs: Yes: External: 5 steps; on right going up and on left going up Has following equipment at home: Single point cane, Walker - 2 wheeled, Environmental consultant - 4 wheeled, and scooter   OCCUPATION: Retired   PLOF: Independent  PATIENT GOALS: Feel less pain in left hip   NEXT MD VISIT: Nothing scheduled with Dr. Nelson Chimes   OBJECTIVE:   BP 124/53 HR 65 SpO2 97  DIAGNOSTIC FINDINGS: No imaging of left hip   PATIENT SURVEYS:  FOTO 48/100 with target of 57  COGNITION: Overall cognitive status: Within functional limits for tasks assessed     SENSATION: WFL    MUSCLE LENGTH: Hamstrings: Right 80 deg; Left 80 deg Thomas test: NT   POSTURE: rounded shoulders and forward head  PALPATION: Right Ischial Tuberosity  LOWER EXTREMITY ROM:  WFL   LUMBAR: All WFL with no pain with exception of forward flexion   LOWER EXTREMITY MMT:   MMT  Right eval Left eval  Hip flexion 4+ 4+  Hip extension NT  NT   Hip abduction 4 4  Hip adduction    Hip internal rotation    Hip external rotation    Knee flexion 4 4*  Knee extension 4 4  Ankle dorsiflexion 4+ 4+  Ankle plantarflexion    Ankle inversion    Ankle eversion      LOWER EXTREMITY SPECIAL TESTS:  Hip special tests: Luisa Hart (FABER) test: negative, Ober's test:  negative, and FADIR: negative bilateral   GAIT: Distance walked: 50 ft  Assistive device utilized: None Level of assistance: Complete Independence Comments: Decreased step length bilaterally, but no other gait deficits noted    TODAY'S TREATMENT:                                                                                                                              DATE:   05/03/23: Nu-Step with seat at 9 and resistance at 2 for 5 min  OMEGA Eccentric HS Curl R/L #30/#30 x 3 for each leg  -No increase in NRPS  FOTO  77 Reviewd HEP including progressions   03/28/23: Nu-Step with seat at 9 and resistance at 1 for 5 min  OMEGA Eccentric HS on LLE #25 lb 3 x 10  Standing HS curls with BUE support #10 AW 1 x 10  Standing HS curls with 1 UE support #10 AW 1 x 10  Standing HS curls with no UE support #10 AW 1 x 10   Lower Trunk Rotation with feet on silver ball 1 x 10   03/13/23: Nu-Step with seat at 11 and resistance at 3 for 5 min  Seated Forward Flexion 3 x 30 sec  AAROM Lumbar Flexion Right 1 x 10 with 3 sec hold with silver ball roll  AAROM Lumbar Flexion Left 1 x 10 with 3 sec hold with silver ball roll  1 RM OMEGA HS  Curl R/L 30/25 Standing Hip Extension with sliders under heel of sliding foot and BUE support 3 x 10  Half Deadlift with water jug 3 x 10  -mod VC to maintain slight knee bend and to generate motion at hips   MANUAL  L1-L2 central spinous process- AP grade III X 20     03/08/23: Matrix Recumbent Bike at 13 seat for 3 min -Pt reports severe left proximal hamstring pain  Nu-Step seat 13 for 6 min  OMEGA Eccentric HS Curl #10 on LLE 1 x 10  OMEGA Eccentric HS Curl #15 on LLE 1 x 10  OMEGA Eccentric HS Curl #20 on LLE 1 x 10  Hip Hinge with pole for TC cueing 3 x 10  -min VC to decrease speed of coming up  Sit to Stand from 18 inches 3 x 10  -min VC to decrease eccentric speed of exercise    03/06/23  Isometric Hamstring Supine Bridge with 5 sec hold 1  x 10    PATIENT EDUCATION:  Education details: form and technique for correct performance of exercise  Person educated: Patient Education method: Programmer, multimedia, Demonstration, Verbal cues, and Handouts Education comprehension: verbalized understanding and returned demonstration  HOME EXERCISE PROGRAM: Access Code: [email or web address] URL: [email or web address] Date: 03/28/2023 Prepared by: Ellin Goodie  Exercises - Supine Single Knee to Chest Stretch  - 1 x daily - 10 reps - 3 sec  hold - Sit to Stand  - 3-4 x weekly - 3 sets - 10 reps - Half Deadlift with Kettlebell  - 3-4 x weekly - 3 sets - 10 reps - Standing Knee Flexion AROM with Chair Support  - 3-4 x weekly - 3 sets - 10 reps  ASSESSMENT:  CLINICAL IMPRESSION: Pt has now met all of his rehab goals with improvement in pain with activity and overall right hamstring strength. He also reports increased right hamstring function as evidenced by improvement in FOTO score. He is now ready for discharge so he can begin cardiac rehab if he chooses.    OBJECTIVE IMPAIRMENTS: decreased strength and pain.   ACTIVITY LIMITATIONS: lifting, bending, and squatting  PARTICIPATION LIMITATIONS: yard work  PERSONAL FACTORS: Age, Time since onset of injury/illness/exacerbation, and 3+ comorbidities: CHF, h/o MI, COPD  are also affecting patient's functional outcome.   REHAB POTENTIAL: Good  CLINICAL DECISION MAKING: Stable/uncomplicated  EVALUATION COMPLEXITY: Low   GOALS: Goals reviewed with patient? No  SHORT TERM GOALS: Target date: 03/20/2023  Pt will be independent with HEP in order to improve strength and balance in order to decrease fall risk and improve function at home and work. Baseline: Not performed independently yet 03/08/23: Performing Exercises independently  Goal status: ACHIEVED     LONG TERM GOALS: Target date: 05/15/2023  Pt will demonstrate 1RM hamstring curl knee flex of LLE of >=90% of RLE for improved   strength to complete heavy outdoor ADLs  Baseline: OMEGA HS Curl R/L 30/25 05/03/23: OMEGA HS Curl R/L 30/30 Goal status: ACHIEVED   2.  Patient will have improved function and activity level as evidenced by an increase in FOTO score by 10 points or more.  Baseline: 48/100 with target of 57 05/03/23: 77 Goal status: ACHIEVED   3.  Patient will be show a reduction in NRPS of >=2 pts of left proximal hamstring as evidence of improved healing and function.  Baseline: 5/10 NRPS  05/03/23: 0/10 NRPS with right proximal hamstring attachment  Goal status: ACHIEVED     PLAN:  PT  FREQUENCY: 1-2x/week  PT DURATION: 10 weeks  PLANNED INTERVENTIONS: Therapeutic exercises, Neuromuscular re-education, Gait training, Joint mobilization, Joint manipulation, Stair training, Aquatic Therapy, Dry Needling, Spinal manipulation, Spinal mobilization, Cryotherapy, Moist heat, Manual therapy, and Re-evaluation  PLAN FOR NEXT SESSION: Discharge from PT  Ellin Goodie PT, DPT  [facility] [address], [place], [address] Phone: [phone]   Fax:  [phone]

## 2023-05-04 ENCOUNTER — Ambulatory Visit: Payer: HMO

## 2023-05-08 ENCOUNTER — Other Ambulatory Visit: Payer: Self-pay | Admitting: Pulmonary Disease

## 2023-05-08 DIAGNOSIS — G4733 Obstructive sleep apnea (adult) (pediatric): Secondary | ICD-10-CM | POA: Diagnosis not present

## 2023-05-08 DIAGNOSIS — I10 Essential (primary) hypertension: Secondary | ICD-10-CM | POA: Diagnosis not present

## 2023-05-09 ENCOUNTER — Ambulatory Visit: Payer: HMO

## 2023-05-09 ENCOUNTER — Telehealth: Payer: Self-pay

## 2023-05-09 ENCOUNTER — Encounter: Payer: HMO | Admitting: Physical Therapy

## 2023-05-09 MED ORDER — TRELEGY ELLIPTA 200-62.5-25 MCG/ACT IN AEPB: 1.0000 | INHALATION_SPRAY | Freq: Every day | RESPIRATORY_TRACT | 0 refills | Status: DC

## 2023-05-09 NOTE — Telephone Encounter (Signed)
The patient's wife came into the office today asking for samples of Trelegy.  Patient's wife was given 2 samples of Trelegy.   Nothing further needed.

## 2023-05-11 ENCOUNTER — Ambulatory Visit: Payer: HMO

## 2023-05-11 DIAGNOSIS — G4733 Obstructive sleep apnea (adult) (pediatric): Secondary | ICD-10-CM | POA: Diagnosis not present

## 2023-05-11 DIAGNOSIS — I10 Essential (primary) hypertension: Secondary | ICD-10-CM | POA: Diagnosis not present

## 2023-05-16 ENCOUNTER — Ambulatory Visit: Payer: HMO

## 2023-05-18 ENCOUNTER — Ambulatory Visit: Payer: HMO

## 2023-05-19 DIAGNOSIS — H34232 Retinal artery branch occlusion, left eye: Secondary | ICD-10-CM | POA: Diagnosis not present

## 2023-05-19 DIAGNOSIS — H43813 Vitreous degeneration, bilateral: Secondary | ICD-10-CM | POA: Diagnosis not present

## 2023-05-23 ENCOUNTER — Ambulatory Visit: Payer: HMO | Admitting: Pulmonary Disease

## 2023-05-23 ENCOUNTER — Ambulatory Visit: Payer: HMO

## 2023-05-23 ENCOUNTER — Encounter: Payer: Self-pay | Admitting: Pulmonary Disease

## 2023-05-23 ENCOUNTER — Telehealth: Payer: Self-pay

## 2023-05-23 VITALS — BP 128/72 | HR 75 | Temp 97.5°F | Ht 72.0 in | Wt 209.0 lb

## 2023-05-23 DIAGNOSIS — J4540 Moderate persistent asthma, uncomplicated: Secondary | ICD-10-CM | POA: Diagnosis not present

## 2023-05-23 DIAGNOSIS — J4489 Other specified chronic obstructive pulmonary disease: Secondary | ICD-10-CM

## 2023-05-23 DIAGNOSIS — I429 Cardiomyopathy, unspecified: Secondary | ICD-10-CM | POA: Diagnosis not present

## 2023-05-23 DIAGNOSIS — I272 Pulmonary hypertension, unspecified: Secondary | ICD-10-CM | POA: Diagnosis not present

## 2023-05-23 LAB — NITRIC OXIDE: Nitric Oxide: 43

## 2023-05-23 MED ORDER — PREDNISONE 20 MG PO TABS: 20.0000 mg | ORAL_TABLET | Freq: Every day | ORAL | 0 refills | Status: AC

## 2023-05-23 MED ORDER — TRELEGY ELLIPTA 100-62.5-25 MCG/ACT IN AEPB: 1.0000 | INHALATION_SPRAY | Freq: Every day | RESPIRATORY_TRACT | 0 refills | Status: DC

## 2023-05-23 NOTE — Telephone Encounter (Signed)
Patient was seen in the office today. He has signed the Dupixent form and it has been faxed to our Pharmacy Team.

## 2023-05-23 NOTE — Patient Instructions (Signed)
I sent a prescription to your pharmacy of some prednisone that you will take only for 5 days.  Its only 1 tablet daily for 5 days.  The level of inflammation in your airway was high today.  This is what is causing your cough productive of yellow sputum.  It has to do with your asthma.  You will benefit from an injection called Dupixent.  This is a shot that you can give yourself twice a month.  The first shot would have to be given in [place] as they would have to go through a teaching process for you to go over the medication and how to administer.  Once this is done you can give it to yourself at home.  Continue Trelegy as you are doing.  We will see you in follow-up in 6 to 8 weeks time call sooner should any new problems arise.

## 2023-05-23 NOTE — Progress Notes (Unsigned)
Subjective:    Patient ID: [name], male    DOB: 1941/01/11, 82 y.o.   MRN: [id number]  Patient Care Team: Eustaquio Boyden, MD as PCP - General (Family Medicine) Mariah Milling Tollie Pizza, MD as PCP - Cardiology (Cardiology) Vilinda Flake, [facility] (Inactive) as Pharmacist (Pharmacist) Antonieta Iba, MD as Consulting Physician (Cardiology) Salena Saner, MD as Consulting Physician (Pulmonary Disease) Coralyn Helling, MD (Inactive) as Consulting Physician (Pulmonary Disease) Iran Ouch, MD as Consulting Physician (Cardiology)  Chief Complaint  Patient presents with   Follow-up    Cough with green/yellow sputum. No SOB. Occasional wheezing.    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 Patient was last seen on 16 March 2023.  At that time he had yet another minor exacerbation and required Azithromycin and was started on montelukast to see if he could act as steroid sparing agent.  He is maintained on Trelegy Ellipta 200 which he states has helped him the most. He is compliant with his CPAP.  Currently he does not endorse any fevers, chills or sweats.  Over the last week however he has developed cough again productive of yellowish sputum occasional mucous plugs.  He uses rescue inhaler every other day. Has not had chest pain, no lower extremity edema or calf tenderness.  It does seem that the triggering events usually are nasal congestion and drainage leading to his changes in sputum production.  Montelukast has not helped this significantly.  Dyspnea is at baseline, no worsening.  He does not endorse any GI symptoms.  No reflux.  He has noted some wheezing.  Does not endorse any other symptomatology.   He had an echocardiogram performed on 22 November 2022 showing LVEF of 40 to 45% with mild decrease left ventricular function.   Left ventricle demonstrates global hypokinesis.  Right ventricular systolic function is normal.  There is mildly elevated pulmonary artery systolic pressure, left atrial size mildly dilated.  Mild mitral valve regurgitation, there is mild aortic stenosis.  His EF has decreased somewhat from prior.  He does follow with cardiology for these issues.   Review of Systems A 10 point review of systems was performed and it is as noted above otherwise negative.   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   OSA (obstructive sleep apnea)    Chronic heart failure with preserved ejection fraction (HFpEF) (HCC)    Right leg pain 06/22/2021   Nocturnal hypoxia 06/11/2021   CKD (chronic kidney disease) stage 3, GFR 30-59 ml/min (HCC) 02/24/2021   Peripheral neuropathy 02/23/2021   Spondylolisthesis of lumbar region 07/07/2020   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   Coccydynia 09/20/2019   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   Anxiety 06/04/2019   Chronic right shoulder pain 09/10/2018   Cervical stenosis of spinal canal 06/05/2017   Pedal edema 06/05/2017   Health maintenance examination 12/06/2016   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/13/2016   Overweight (BMI 25.0-29.9) 07/04/2016   Low  vitamin B12 level 01/01/2016   Exertional dyspnea 07/07/2015   Unstable angina (HCC)    Medicare annual wellness visit, subsequent 07/03/2015   Advanced care planning/counseling discussion 07/03/2015   Type 2 diabetes mellitus with other specified complication (HCC) 05/04/2015   Pleuritic chest pain  05/04/2015   Ex-smoker 05/04/2015   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   Spermatocele 04/02/2013   Syncopal vertigo 11/04/2010   Lumbar disc disease with radiculopathy 11/04/2010   Coronary artery disease involving native coronary artery of native heart with unstable angina pectoris (HCC) 08/11/2009   PAD (peripheral artery disease) (HCC) 08/11/2009   DUPUYTREN'S CONTRACTURE, RIGHT 10/29/2008   Carotid stenosis 07/09/2008   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/2008   Benign prostatic hyperplasia with urinary obstruction 09/06/2007   Essential hypertension 04/30/2007   GERD 04/30/2007   Hyperlipidemia associated with type 2 diabetes mellitus (HCC) 04/26/2007   OA (osteoarthritis) of knee 04/26/2007   DVT, HX OF 04/26/2007    Social History   Tobacco Use   Smoking status: Former    Current packs/day: 0.00    Average packs/day: 1 pack/day for 25.0 years (25.0 ttl pk-yrs)    Types: Cigarettes    Start date: 08/16/1963    Quit date: 08/15/1988    Years since quitting: 34.7    Passive exposure: Past   Smokeless tobacco: Never  Substance Use Topics   Alcohol use: No    Alcohol/week: 0.0 standard drinks of alcohol    Allergies  Allergen Reactions   Vioxx [Rofecoxib] Other (See Comments)    Hemorrhage    Spiriva Respimat [Tiotropium Bromide Monohydrate] Other (See Comments)    Elevated bp   Contrast Media [Iodinated Contrast Media] Itching and Rash    Delayed reaction post abdominal aortagram.    Morphine Nausea Only and Other (See Comments)    Irritability     Current Meds  Medication Sig   albuterol (PROVENTIL) (2.5 MG/3ML) 0.083% nebulizer solution Take 3 mLs (2.5 mg total) by nebulization every 6 (six) hours as needed for wheezing or shortness of breath.   albuterol (VENTOLIN HFA) 108 (90 Base) MCG/ACT inhaler TAKE 2 PUFFS INTO LUNGS EVERY 6 HOURS ASNEEDED FOR WHEEZING OR SHORTNESS OF BREATH   aspirin EC 81 MG EC tablet Take 1 tablet (81 mg total) by mouth daily.    atorvastatin (LIPITOR) 20 MG tablet TAKE 1 TABLET BY MOUTH DAILY   Cholecalciferol (VITAMIN D3) 25 MCG (1000 UT) CAPS Take 1 capsule (1,000 Units total) by mouth 2 (two) times a week.   clopidogrel (PLAVIX) 75 MG tablet Take 1 tablet (75 mg total) by mouth daily with breakfast.   cyclobenzaprine (FLEXERIL) 5 MG tablet Take 1 tablet (5 mg total) by mouth 3 (three) times daily as needed for muscle spasms.   docusate sodium (COLACE) 100 MG capsule Take 1 capsule (100 mg total) by mouth daily as needed for mild constipation.   ezetimibe (ZETIA) 10 MG tablet Take 1 tablet (10 mg total) by mouth daily.   famotidine (PEPCID) 20 MG tablet Take 20 mg by mouth daily.   fluticasone (FLONASE) 50 MCG/ACT nasal spray Place 1 spray into both nostrils daily as needed for allergies or rhinitis.   Fluticasone-Umeclidin-Vilant (TRELEGY ELLIPTA) 200-62.5-25 MCG/ACT AEPB Inhale 1 puff into the lungs daily.   furosemide (LASIX) 40 MG tablet Take 0.5 tablets (20 mg total) by mouth daily. Take 1 tablet (40 mg) daily as need for weight greater then 208.   gabapentin (NEURONTIN) 600 MG tablet Take 1 tablet (600 mg total)  by mouth 3 (three) times daily.   HYDROcodone-acetaminophen (NORCO/VICODIN) 5-325 MG tablet Take 1-2 tablets by mouth every 4 (four) hours as needed for moderate pain.   isosorbide mononitrate (IMDUR) 30 MG 24 hr tablet Take 1 tablet (30 mg total) by mouth daily.   losartan (COZAAR) 25 MG tablet TAKE 1 TABLET BY MOUTH DAILY   montelukast (SINGULAIR) 10 MG tablet TAKE 1 TABLET BY MOUTH DAILY   mupirocin ointment (BACTROBAN) 2 % Apply 1 Application topically 2 (two) times daily.   nitroGLYCERIN (NITROSTAT) 0.4 MG SL tablet Place 1 tablet (0.4 mg total) under the tongue every 5 (five) minutes as needed for chest pain.   polyethylene glycol (MIRALAX / GLYCOLAX) 17 g packet Take 17 g by mouth daily as needed for mild constipation.    ranolazine (RANEXA) 500 MG 12 hr tablet TAKE ONE (1) TABLET BY MOUTH TWO TIMES  PER DAY   silodosin (RAPAFLO) 8 MG CAPS capsule TAKE ONE CAPSULE BY MOUTH DAILY WITH BREAKFAST.   Vibegron (GEMTESA) 75 MG TABS Take 1 tablet (75 mg total) by mouth daily.    Immunization History  Administered Date(s) Administered   Fluad Quad(high Dose 65+) 04/17/2019, 04/15/2022, 05/08/2023   Influenza Whole 08/15/2000, 07/04/2007, 05/09/2008, 05/31/2010   Influenza, High Dose Seasonal PF 05/15/2013, 06/25/2015, 07/05/2021   Influenza,inj,Quad PF,6+ Mos 04/17/2014, 05/18/2016, 06/07/2018   Influenza-Unspecified 05/22/2017, 05/28/2020   PFIZER(Purple Top)SARS-COV-2 Vaccination 09/07/2019, 09/28/2019, 05/12/2020, 01/19/2021   Pneumococcal Conjugate-13 02/09/2015   Pneumococcal Polysaccharide-23 08/15/2000, 10/29/2008   Respiratory Syncytial Virus Vaccine,Recomb Aduvanted(Arexvy) 10/10/2022   Td 08/15/2005   Tdap 02/10/2018   Zoster Recombinant(Shingrix) 04/05/2017, 07/25/2017   Zoster, Live 02/04/2009        Objective:   BP 128/72 (BP Location: Right Arm, Cuff Size: Large)   Pulse 75   Temp (!) 97.5 F (36.4 C)   Ht 6' (1.829 m)   Wt 209 lb (94.8 kg)   SpO2 97%   BMI 28.35 kg/m   SpO2: 97 % O2 Device: None (Room air)  GENERAL: This is a well-developed, overweight gentleman, chronically ill-appearing, awake, alert, no acute distress.  Fully ambulatory. HEAD: Normocephalic, atraumatic.  EYES: Pupils equal, round, reactive to light.  No scleral icterus.  MOUTH: Nose/mouth/throat not examined due to masking requirements per hospital guidelines. NECK: Supple. No thyromegaly. Trachea midline. No JVD.  No adenopathy.  Anterior cervical fusion incision, healed. PULMONARY: Good air entry bilaterally, coarse breath sounds otherwise, no adventitious sounds.  CARDIOVASCULAR: S1 and S2. Regular rate and rhythm.  Grade 1/6 systolic ejection murmur left sternal border. GASTROINTESTINAL: Protuberant abdomen, soft, no tenderness.   MUSCULOSKELETAL: No joint deformity, no clubbing, no  edema.  NEUROLOGIC: No overt focal deficits noted.  Speech is fluent.   SKIN: Intact,warm,dry.  Limited exam shows no rashes.  He has multiple purpura and ecchymoses in the upper extremities. PSYCH: Mood and behavior normal.:  Lab Results  Component Value Date   NITRICOXIDE 43 05/23/2023     Assessment & Plan:     ICD-10-CM   1. Moderate persistent asthma without complication  J45.40 Nitric oxide   Eosinophils 600 cells on prior CBC with differential Nitric oxide 43 ppb On Trelegy 200 On as needed albuterol Short burst prednisone x 5 days Dupixent    2. COPD with asthma (HCC)  J44.89    Will benefit from Dupixent twice per month Will initiate process    3. Cardiomyopathy, unspecified type (HCC)  I42.9    LVEF 35 to 40% by cardiac cath 13 Jan 2023 This issue adds complexity to his management Follows with cardiology    4. Pulmonary hypertension (HCC) - mild/moderate  I27.20    Confirmed by 13 Jan 2023 cardiac cath Multifactorial      Orders Placed This Encounter  Procedures   Nitric oxide    Meds ordered this encounter  Medications   predniSONE (DELTASONE) 20 MG tablet    Sig: Take 1 tablet (20 mg total) by mouth daily with breakfast for 5 days.    Dispense:  5 tablet    Refill:  0   I believe the patient will benefit from Dupixent given his asthma/COPD overlap and known prior eosinophilia.  The patient is also technically prednisone dependent given the frequent need for prednisone burst to control his symptoms.  Will see the patient in follow-up in 6 to 8 weeks time he is to contact us prior to that time should any new difficulties arise.   Gailen Shelter, MD Advanced Bronchoscopy PCCM [facility]    *This note was dictated using voice recognition software/Dragon.  Despite best efforts to proofread, errors can occur which can change the meaning. Any transcriptional errors that result from this process are unintentional and may not be  fully corrected at the time of dictation.

## 2023-05-24 ENCOUNTER — Other Ambulatory Visit: Payer: Self-pay

## 2023-05-24 NOTE — Telephone Encounter (Signed)
Received Dupixent enrollment form. Please see specialty pharmacy encounter started by Tyson Alias, PharmD, MPH, BCPS, CPP Clinical Pharmacist (Rheumatology and Pulmonology)

## 2023-05-24 NOTE — Progress Notes (Deleted)
BCG Bladder Instillation  BCG # 1/3  Due to Bladder Cancer patient is present today for a BCG treatment. Patient was cleaned and prepped in a sterile fashion with betadine. A ***FR catheter was inserted, urine return was noted ***ml, urine was *** in color.  50ml of reconstituted BCG was instilled into the bladder. The catheter was then removed. Patient tolerated well, {dnt complications:20057}  Performed by: ***  Follow up/ Additional notes: ***

## 2023-05-24 NOTE — Progress Notes (Signed)
Received New start paperwork for DUPIXENT. Will update as we work through the benefits process.  Submitted a Prior Authorization request to [facility] ADVANTAGE/RX ADVANCE for DUPIXENT via CoverMyMeds. Will update once we receive a response.  Key:  [id number]

## 2023-05-25 ENCOUNTER — Ambulatory Visit: Payer: HMO | Admitting: Internal Medicine

## 2023-05-25 ENCOUNTER — Ambulatory Visit: Payer: HMO

## 2023-05-25 ENCOUNTER — Ambulatory Visit: Payer: HMO | Admitting: Urology

## 2023-05-26 ENCOUNTER — Encounter: Payer: Self-pay | Admitting: Family Medicine

## 2023-05-26 ENCOUNTER — Ambulatory Visit: Payer: HMO | Admitting: Family Medicine

## 2023-05-26 VITALS — BP 134/60 | HR 73 | Temp 98.3°F | Ht 72.0 in | Wt 210.0 lb

## 2023-05-26 DIAGNOSIS — M25572 Pain in left ankle and joints of left foot: Secondary | ICD-10-CM | POA: Diagnosis not present

## 2023-05-26 DIAGNOSIS — M25472 Effusion, left ankle: Secondary | ICD-10-CM | POA: Diagnosis not present

## 2023-05-26 DIAGNOSIS — R0981 Nasal congestion: Secondary | ICD-10-CM

## 2023-05-26 NOTE — Assessment & Plan Note (Addendum)
Acute worsening over the past 1-2 days.  Currently on prednisone 20mg  burst through pulm, not helping sinus symptoms. Anticipate currently developing viral URI vs viral sinusitis - supportive measures reviewed. Anticipate allergic rhinitis component as well - discussed allergen avoidance measures. Avoiding flonase intranasal steroid due to increased nosebleeds - recommend plain mucinex and nasal saline irrigation.  Update worsening symptoms or ongoing past 7-10 days to start antibiotic to cover bacterial sinusitis.

## 2023-05-26 NOTE — Assessment & Plan Note (Signed)
1 wk h/o left ankle pain/stiffness with progression to swelling.  It did start to improve with prednisone 20mg  daily course.  Not consistent with gout as no erythema, calor or severe pain associated with this. Xray of ankle several months ago with mild DJD.  Suspect possible osteoarthritis flare that is effectively being treated with prednisone.  Continue supportive measures, finish prednisone, update if recurrent or worsening symptoms.

## 2023-05-26 NOTE — Progress Notes (Signed)
Ph: [phone] Fax: [phone]   Patient ID: [name], male    DOB: Mar 11, 1941, 82 y.o.   MRN: [id number]  This visit was conducted in person.  BP 134/60 (BP Location: Right Arm, Patient Position: Sitting, Cuff Size: Large)   Pulse 73   Temp 98.3 F (36.8 C)   Ht 6' (1.829 m)   Wt 210 lb (95.3 kg)   SpO2 96%   BMI 28.48 kg/m    CC: left ankle swelling  Subjective:   HPI: [name] is a 82 y.o. male presenting on 05/26/2023 for Left Ankle Swelling (Here with wife. Started about 1 week ago when he woke up. Was painful to walk. No pain today. Happened to see Pulmonology 3 days and was put on prednisone for a cough. Feeling better today.) and Respiratory Illness (Head congestion started a few days ago. Possibly allergies. )   Known post COVID cardiomyopathy now recovered, coronary artery disease with stable angina followed by Dr. Mariah Milling cardiology, and peripheral arterial disease with bilateral SFA disease without claudication followed by Dr. Kirke Corin.  1 wk h/o L ankle pain and stiffness that suddenly developed when he woke up.  Subsequently swelling developed. Denies inciting trauma or injury. Treated with ice, leg elevation.  No redness or warmth.  Not painful to the touch.  No fevers/chills, bug bites.  No known history of gout. No increase in red meats, shrimp, shellfish, beer, organ meats.  No other joints recently affected   He saw pulmonology Dr Jayme Cloud October 8 for asthma and overlapping COPD with eosinophilia, planning to start Dupixent twice monthly along with Trelegy 200 mg daily.  Placed on short 5-day prednisone burst for chest congestion.   3 d h/o head congestion > chest congestion, PNdrainage with blowing mucous, cough.  No fevers/chills, ear or tooth pain, ST.  He was recently mowing yard without mask.  COVID test yesterday negative.   Due for ENT f/u - had to cancel appt as he was exposed to COVID.   He actually finds prednisone helped  left leg symptoms.   No results found for: "[facility]"  Lab Results  Component Value Date   ANA Negative 10/04/2022   Lab Results  Component Value Date   RF <14 12/07/2018   Lab Results  Component Value Date   ESRSEDRATE 8 10/04/2022       Relevant past medical, surgical, family and social history reviewed and updated as indicated. Interim medical history since our last visit reviewed. Allergies and medications reviewed and updated. Outpatient Medications Prior to Visit  Medication Sig Dispense Refill   albuterol (PROVENTIL) (2.5 MG/3ML) 0.083% nebulizer solution Take 3 mLs (2.5 mg total) by nebulization every 6 (six) hours as needed for wheezing or shortness of breath. 75 mL 12   albuterol (VENTOLIN HFA) 108 (90 Base) MCG/ACT inhaler TAKE 2 PUFFS INTO LUNGS EVERY 6 HOURS ASNEEDED FOR WHEEZING OR SHORTNESS OF BREATH 8.5 g 6   aspirin EC 81 MG EC tablet Take 1 tablet (81 mg total) by mouth daily. 30 tablet 0   atorvastatin (LIPITOR) 20 MG tablet TAKE 1 TABLET BY MOUTH DAILY 90 tablet 1   Cholecalciferol (VITAMIN D3) 25 MCG (1000 UT) CAPS Take 1 capsule (1,000 Units total) by mouth 2 (two) times a week.     clopidogrel (PLAVIX) 75 MG tablet Take 1 tablet (75 mg total) by mouth daily with breakfast. 90 tablet 3   cyclobenzaprine (FLEXERIL) 5 MG tablet Take 1 tablet (5 mg total)  by mouth 3 (three) times daily as needed for muscle spasms. 30 tablet 0   docusate sodium (COLACE) 100 MG capsule Take 1 capsule (100 mg total) by mouth daily as needed for mild constipation.     ezetimibe (ZETIA) 10 MG tablet Take 1 tablet (10 mg total) by mouth daily. 90 tablet 3   famotidine (PEPCID) 20 MG tablet Take 20 mg by mouth daily.     fluticasone (FLONASE) 50 MCG/ACT nasal spray Place 1 spray into both nostrils daily as needed for allergies or rhinitis.     Fluticasone-Umeclidin-Vilant (TRELEGY ELLIPTA) 200-62.5-25 MCG/ACT AEPB Inhale 1 puff into the lungs daily.     furosemide (LASIX) 40 MG tablet  Take 0.5 tablets (20 mg total) by mouth daily. Take 1 tablet (40 mg) daily as need for weight greater then 208. 180 tablet 3   gabapentin (NEURONTIN) 600 MG tablet Take 1 tablet (600 mg total) by mouth 3 (three) times daily. 270 tablet 4   HYDROcodone-acetaminophen (NORCO/VICODIN) 5-325 MG tablet Take 1-2 tablets by mouth every 4 (four) hours as needed for moderate pain. 30 tablet 0   isosorbide mononitrate (IMDUR) 30 MG 24 hr tablet Take 1 tablet (30 mg total) by mouth daily. 90 tablet 3   losartan (COZAAR) 25 MG tablet TAKE 1 TABLET BY MOUTH DAILY 90 tablet 0   montelukast (SINGULAIR) 10 MG tablet TAKE 1 TABLET BY MOUTH DAILY 30 tablet 2   mupirocin ointment (BACTROBAN) 2 % Apply 1 Application topically 2 (two) times daily. 22 g 0   nitroGLYCERIN (NITROSTAT) 0.4 MG SL tablet Place 1 tablet (0.4 mg total) under the tongue every 5 (five) minutes as needed for chest pain. 25 tablet 3   polyethylene glycol (MIRALAX / GLYCOLAX) 17 g packet Take 17 g by mouth daily as needed for mild constipation.      predniSONE (DELTASONE) 20 MG tablet Take 1 tablet (20 mg total) by mouth daily with breakfast for 5 days. 5 tablet 0   ranolazine (RANEXA) 500 MG 12 hr tablet TAKE ONE (1) TABLET BY MOUTH TWO TIMES PER DAY 180 tablet 3   silodosin (RAPAFLO) 8 MG CAPS capsule TAKE ONE CAPSULE BY MOUTH DAILY WITH BREAKFAST. 30 capsule 0   Vibegron (GEMTESA) 75 MG TABS Take 1 tablet (75 mg total) by mouth daily. 42 tablet 0   carvedilol (COREG) 3.125 MG tablet Take 1 tablet (3.125 mg total) by mouth 2 (two) times daily. 180 tablet 3   No facility-administered medications prior to visit.     Per HPI unless specifically indicated in ROS section below Review of Systems  Objective:  BP 134/60 (BP Location: Right Arm, Patient Position: Sitting, Cuff Size: Large)   Pulse 73   Temp 98.3 F (36.8 C)   Ht 6' (1.829 m)   Wt 210 lb (95.3 kg)   SpO2 96%   BMI 28.48 kg/m   Wt Readings from Last 3 Encounters:  05/26/23 210  lb (95.3 kg)  05/23/23 209 lb (94.8 kg)  04/12/23 209 lb (94.8 kg)      Physical Exam Vitals and nursing note reviewed.  Constitutional:      Appearance: Normal appearance. He is not ill-appearing.  HENT:     Head: Normocephalic and atraumatic.     Right Ear: Hearing, tympanic membrane, ear canal and external ear normal. There is no impacted cerumen.     Left Ear: Hearing, tympanic membrane, ear canal and external ear normal. There is no impacted cerumen. A PE tube  is present.     Nose:     Right Sinus: No maxillary sinus tenderness or frontal sinus tenderness.     Left Sinus: No maxillary sinus tenderness or frontal sinus tenderness.     Mouth/Throat:     Mouth: Mucous membranes are moist.     Pharynx: Oropharynx is clear. No oropharyngeal exudate or posterior oropharyngeal erythema.  Eyes:     Extraocular Movements: Extraocular movements intact.     Conjunctiva/sclera: Conjunctivae normal.     Pupils: Pupils are equal, round, and reactive to light.  Cardiovascular:     Rate and Rhythm: Normal rate and regular rhythm.     Pulses: Normal pulses.     Heart sounds: Normal heart sounds. No murmur heard. Pulmonary:     Effort: Pulmonary effort is normal. No respiratory distress.     Breath sounds: Normal breath sounds. No wheezing, rhonchi or rales.  Musculoskeletal:     Cervical back: Normal range of motion and neck supple. No rigidity.     Right lower leg: No edema.     Left lower leg: No edema.     Comments:  Mild stiffness and mild left dorsal foot swelling without erythema, warmth, significant pain to palpation of left foot/ankle. No pain at achilles, no pain with malleolar percussion, no pain at base of 5th MT or navicular, no pain with calcaneal squeeze Diminished pedal pulses bilaterally   Lymphadenopathy:     Cervical: No cervical adenopathy.  Skin:    General: Skin is warm and dry.     Findings: No rash.  Neurological:     Mental Status: He is alert.  Psychiatric:         Mood and Affect: Mood normal.        Behavior: Behavior normal.       Results for orders placed or performed in visit on 05/23/23  Nitric oxide  Result Value Ref Range   Nitric Oxide 43    *Note: Due to a large number of results and/or encounters for the requested time period, some results have not been displayed. A complete set of results can be found in Results Review.    Assessment & Plan:   Problem List Items Addressed This Visit     Nasal sinus congestion    Acute worsening over the past 1-2 days.  Currently on prednisone 20mg  burst through pulm, not helping sinus symptoms. Anticipate currently developing viral URI vs viral sinusitis - supportive measures reviewed. Anticipate allergic rhinitis component as well - discussed allergen avoidance measures. Avoiding flonase intranasal steroid due to increased nosebleeds - recommend plain mucinex and nasal saline irrigation.  Update worsening symptoms or ongoing past 7-10 days to start antibiotic to cover bacterial sinusitis.       Pain and swelling of left ankle - Primary    1 wk h/o left ankle pain/stiffness with progression to swelling.  It did start to improve with prednisone 20mg  daily course.  Not consistent with gout as no erythema, calor or severe pain associated with this. Xray of ankle several months ago with mild DJD.  Suspect possible osteoarthritis flare that is effectively being treated with prednisone.  Continue supportive measures, finish prednisone, update if recurrent or worsening symptoms.        No orders of the defined types were placed in this encounter.   No orders of the defined types were placed in this encounter.   Patient Instructions  For left ankle swelling - possibly flare of wear and  tear arthritis. Prednisone course should help with this. Let us know if recurrent or worsening swelling to return for lab work.   You may have a viral sinus infection. Push fluids and plenty of rest. Nasal  saline irrigation or neti pot to help drain sinuses. May use plain mucinex with plenty of fluid to help mobilize mucous. Let us know if ongoing symptoms past 7-10 days or any new fever or one sided worsening sinus pressure pain  Please let us know if fever >101.5, trouble opening/closing mouth, difficulty swallowing, or worsening instead of improving as expected.   Follow up plan: Return if symptoms worsen or fail to improve.  Eustaquio Boyden, MD

## 2023-05-26 NOTE — Patient Instructions (Signed)
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 Prednisone course should help with this. Let us know if recurrent or worsening swelling to return for lab work.   You may have a viral sinus infection. Push fluids and plenty of rest. Nasal saline irrigation or neti pot to help drain sinuses. May use plain mucinex with plenty of fluid to help mobilize mucous. Let us know if ongoing symptoms past 7-10 days or any new fever or one sided worsening sinus pressure pain  Please let us know if fever >101.5, trouble opening/closing mouth, difficulty swallowing, or worsening instead of improving as expected.

## 2023-05-29 ENCOUNTER — Encounter: Payer: Self-pay | Admitting: Pulmonary Disease

## 2023-05-30 ENCOUNTER — Ambulatory Visit: Payer: HMO

## 2023-05-30 NOTE — Progress Notes (Unsigned)
BCG Bladder Instillation  BCG # 1/3  Due to Bladder Cancer patient is present today for a BCG treatment. Patient was cleaned and prepped in a sterile fashion with betadine. A 14FR catheter was inserted, urine return was noted 150 ml, urine was yellow in color.  50ml of reconstituted BCG was instilled into the bladder. The catheter was then removed. Patient tolerated well, no complications were noted  Performed by: Michiel Cowboy, PA-C and Annabell Sabal, CMA   Follow up/ Additional notes: Next week for #2/3

## 2023-06-01 ENCOUNTER — Encounter: Payer: Self-pay | Admitting: Urology

## 2023-06-01 ENCOUNTER — Ambulatory Visit (INDEPENDENT_AMBULATORY_CARE_PROVIDER_SITE_OTHER): Payer: HMO | Admitting: Urology

## 2023-06-01 ENCOUNTER — Ambulatory Visit: Payer: HMO

## 2023-06-01 VITALS — BP 120/71 | HR 73 | Ht 72.0 in | Wt 204.0 lb

## 2023-06-01 DIAGNOSIS — C679 Malignant neoplasm of bladder, unspecified: Secondary | ICD-10-CM

## 2023-06-01 LAB — URINALYSIS, COMPLETE
Bilirubin, UA: NEGATIVE
Glucose, UA: NEGATIVE
Ketones, UA: NEGATIVE
Leukocytes,UA: NEGATIVE
Nitrite, UA: NEGATIVE
Protein,UA: NEGATIVE
RBC, UA: NEGATIVE
Specific Gravity, UA: 1.015 (ref 1.005–1.030)
Urobilinogen, Ur: 0.2 mg/dL (ref 0.2–1.0)
pH, UA: 6 (ref 5.0–7.5)

## 2023-06-01 LAB — MICROSCOPIC EXAMINATION

## 2023-06-01 MED ORDER — BCG LIVE 50 MG IS SUSR
3.2400 mL | Freq: Once | INTRAVESICAL | Status: AC
Start: 2023-06-01 — End: 2023-06-01
  Administered 2023-06-01: 81 mg via INTRAVESICAL

## 2023-06-06 ENCOUNTER — Ambulatory Visit: Payer: HMO

## 2023-06-06 ENCOUNTER — Telehealth: Payer: Self-pay | Admitting: Family Medicine

## 2023-06-06 MED ORDER — AMOXICILLIN-POT CLAVULANATE 875-125 MG PO TABS
1.0000 | ORAL_TABLET | Freq: Two times a day (BID) | ORAL | 0 refills | Status: AC
Start: 2023-06-06 — End: 2023-06-16

## 2023-06-06 NOTE — Telephone Encounter (Signed)
Rtn pt's call. Pt states he just saw Dr Reece Agar on 05/26/23 concerning sxs and not any better. Pt requests something be sent to [facility] so he doesn't get bronchitis.

## 2023-06-06 NOTE — Telephone Encounter (Signed)
Plz notify I've sent in augmentin antibiotic to take for sinus infection. Let us know if not improved after treatment.

## 2023-06-06 NOTE — Progress Notes (Deleted)
BCG Bladder Instillation  BCG # 2/3  Due to Bladder Cancer patient is present today for a BCG treatment. Patient was cleaned and prepped in a sterile fashion with betadine. A 14 FR catheter was inserted, urine return was noted ***ml, urine was *** in color.  50ml of reconstituted BCG was instilled into the bladder. The catheter was then removed. Patient tolerated well, no complications were noted  Performed by: Michiel Cowboy, PA-C   Follow up/ Additional notes: ***

## 2023-06-06 NOTE — Telephone Encounter (Signed)
Pt called in and stated that he is congestion and would like for Dr.G to call him in a prescription to the pharmacy or have the CMA to call pt back if possible

## 2023-06-06 NOTE — Addendum Note (Signed)
Addended by: Eustaquio Boyden on: 06/06/2023 05:06 PM   Modules accepted: Orders

## 2023-06-07 NOTE — Telephone Encounter (Signed)
Spoke with pt relaying Dr. G's message.  Pt verbalizes understanding and expresses his thanks.  

## 2023-06-08 ENCOUNTER — Ambulatory Visit: Payer: HMO

## 2023-06-08 ENCOUNTER — Ambulatory Visit: Payer: HMO | Admitting: Urology

## 2023-06-08 ENCOUNTER — Telehealth: Payer: Self-pay | Admitting: Pulmonary Disease

## 2023-06-08 DIAGNOSIS — C679 Malignant neoplasm of bladder, unspecified: Secondary | ICD-10-CM

## 2023-06-08 NOTE — Telephone Encounter (Signed)
Spoke to patient. He is wanting to know when Dupixent injection will be scheduled.  Pharmacy team, please advise. Thanks

## 2023-06-08 NOTE — Telephone Encounter (Signed)
Patient is calling because he has been approved for Dupixent by his insurance company and is calling wanting to know when his appointment will be set up. Please call and advise.

## 2023-06-09 ENCOUNTER — Other Ambulatory Visit (HOSPITAL_COMMUNITY): Payer: Self-pay

## 2023-06-09 NOTE — Telephone Encounter (Signed)
Returned call to patient and advised of next steps with [employer] PAP. Advised him to call next week Thursday, Friday to express financial hardship with affording medicaiton. Provided him with copay of medication and contact information for [employer]. Will need to submit PAP early next week  Phone #: [phone] Fax #: [phone]  Chesley Mires, PharmD, MPH, BCPS, CPP Clinical Pharmacist (Rheumatology and Pulmonology)

## 2023-06-09 NOTE — Progress Notes (Signed)
Returned call to patient and advised of next steps with [employer] PAP. Advised him to call next week Thursday, Friday to express financial hardship with affording medicaiton. Provided him with copay of medication and contact information for [employer]. Will need to submit PAP early next week  Phone #: [phone] Fax #: [phone]  Chesley Mires, PharmD, MPH, BCPS, CPP Clinical Pharmacist (Rheumatology and Pulmonology)

## 2023-06-09 NOTE — Progress Notes (Signed)
Received notification from [facility] [employer] regarding a prior authorization for DUPIXENT. Authorization has been APPROVED from 05/24/23 to 11/20/23.   Per test claim, copay for 28 days supply is $1038.31  Authorization # (Key: [id number]) PA Case ID #: [id number]

## 2023-06-12 ENCOUNTER — Ambulatory Visit (INDEPENDENT_AMBULATORY_CARE_PROVIDER_SITE_OTHER): Payer: HMO | Admitting: Dermatology

## 2023-06-12 ENCOUNTER — Encounter: Payer: Self-pay | Admitting: Dermatology

## 2023-06-12 DIAGNOSIS — Z7189 Other specified counseling: Secondary | ICD-10-CM

## 2023-06-12 DIAGNOSIS — L820 Inflamed seborrheic keratosis: Secondary | ICD-10-CM | POA: Diagnosis not present

## 2023-06-12 DIAGNOSIS — L219 Seborrheic dermatitis, unspecified: Secondary | ICD-10-CM

## 2023-06-12 DIAGNOSIS — L821 Other seborrheic keratosis: Secondary | ICD-10-CM | POA: Diagnosis not present

## 2023-06-12 MED ORDER — KETOCONAZOLE 2 % EX CREA: 1.0000 | TOPICAL_CREAM | Freq: Two times a day (BID) | CUTANEOUS | 5 refills | Status: AC

## 2023-06-12 MED ORDER — HYDROCORTISONE 2.5 % EX CREA: TOPICAL_CREAM | Freq: Two times a day (BID) | CUTANEOUS | 5 refills | Status: DC | PRN

## 2023-06-12 NOTE — Progress Notes (Signed)
   Follow-Up Visit   Subjective  [name] is a 82 y.o. male who presents for the following: several spots at face, some that itch occasionally, no bleeding. Also a rough spot at right shoulder that was sore but not now.   The patient has spots, moles and lesions to be evaluated, some may be new or changing and the patient may have concern these could be cancer.   The following portions of the chart were reviewed this encounter and updated as appropriate: medications, allergies, medical history  Review of Systems:  No other skin or systemic complaints except as noted in HPI or Assessment and Plan.  Objective  Well appearing patient in no apparent distress; mood and affect are within normal limits.   A focused examination was performed of the following areas: Chin perinasal right shoulder  Relevant exam findings are noted in the Assessment and Plan.  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  It tends to be exacerbated by stress and cooler weather.  People who have neurologic disease may experience new onset or exacerbation of existing seborrheic dermatitis.  The condition is not curable but treatable and can be controlled.  Treatment Plan: Restart HC 2.5% cream BID Restart ketoconazole 2% cream BID   SEBORRHEIC KERATOSIS - Stuck-on, waxy, tan-brown papules and/or plaques  - Benign-appearing - Discussed benign etiology and prognosis. - Observe - Call for any changes     Inflamed seborrheic keratosis (2) Right Shoulder x 2  Symptomatic, irritating, patient would like  treated.  Benign-appearing.  Call clinic for new or changing lesions.    Destruction of lesion - Right Shoulder x 2 (2) Complexity: simple   Destruction method: cryotherapy   Informed consent: discussed and consent obtained   Timeout:  patient name, date of birth, surgical site, and procedure verified Lesion destroyed using liquid nitrogen: Yes   Region frozen until ice ball extended beyond lesion: Yes   Cryo cycles: 1 or 2. Outcome: patient tolerated procedure well with no complications   Post-procedure details: wound care instructions given    Seborrheic dermatitis  Related Medications hydrocortisone 2.5 % cream Apply topically 2 (two) times daily as needed (Rash).  ketoconazole (NIZORAL) 2 % cream Apply 1 Application topically 2 (two) times daily.    Return for TBSE, with Dr. Kirtland Bouchard, as scheduled.  Anise Salvo, RMA, am acting as scribe for Elie Goody, MD .   Documentation: I have reviewed the above documentation for accuracy and completeness, and I agree with the above.  Elie Goody, MD

## 2023-06-12 NOTE — Patient Instructions (Signed)
Cryotherapy Aftercare  Wash gently with soap and water everyday.   Apply Vaseline and Band-Aid daily until healed.   Due to recent changes in healthcare laws, you may see results of your pathology and/or laboratory studies on MyChart before the doctors have had a chance to review them. We understand that in some cases there may be results that are confusing or concerning to you. Please understand that not all results are received at the same time and often the doctors may need to interpret multiple results in order to provide you with the best plan of care or course of treatment. Therefore, we ask that you please give [place] 2 business days to thoroughly review all your results before contacting the office for clarification. Should we see a critical lab result, you will be contacted sooner.   If You Need Anything After Your Visit  If you have any questions or concerns for your doctor, please call our main line at [phone] and press option 4 to reach your doctor's medical assistant. If no one answers, please leave a voicemail as directed and we will return your call as soon as possible. Messages left after 4 pm will be answered the following business day.   You may also send [place] a message via MyChart. We typically respond to MyChart messages within 1-2 business days.  For prescription refills, please ask your pharmacy to contact our office. Our fax number is [phone].  If you have an urgent issue when the clinic is closed that cannot wait until the next business day, you can page your doctor at the number below.    Please note that while we do our best to be available for urgent issues outside of office hours, we are not available 24/7.   If you have an urgent issue and are unable to reach [place], you may choose to seek medical care at your doctor's office, retail clinic, urgent care center, or emergency room.  If you have a medical emergency, please immediately call 911 or go to the emergency  department.  Pager Numbers  - Dr. Gwen Pounds: [phone]  - Dr. Roseanne Reno: [phone]  - Dr. Katrinka Blazing: [phone]   In the event of inclement weather, please call our main line at [phone] for an update on the status of any delays or closures.  Dermatology Medication Tips: Please keep the boxes that topical medications come in in order to help keep track of the instructions about where and how to use these. Pharmacies typically print the medication instructions only on the boxes and not directly on the medication tubes.   If your medication is too expensive, please contact our office at [phone] option 4 or send [place] a message through MyChart.   We are unable to tell what your co-pay for medications will be in advance as this is different depending on your insurance coverage. However, we may be able to find a substitute medication at lower cost or fill out paperwork to get insurance to cover a needed medication.   If a prior authorization is required to get your medication covered by your insurance company, please allow [place] 1-2 business days to complete this process.  Drug prices often vary depending on where the prescription is filled and some pharmacies may offer cheaper prices.  The website www.goodrx.com contains coupons for medications through different pharmacies. The prices here do not account for what the cost may be with help from insurance (it may be cheaper with your insurance), but the website can give  you the price if you did not use any insurance.  - You can print the associated coupon and take it with your prescription to the pharmacy.  - You may also stop by our office during regular business hours and pick up a GoodRx coupon card.  - If you need your prescription sent electronically to a different pharmacy, notify our office through [facility] or by phone at [phone] option 4.

## 2023-06-13 ENCOUNTER — Other Ambulatory Visit (HOSPITAL_COMMUNITY): Payer: Self-pay

## 2023-06-13 NOTE — Progress Notes (Signed)
Submitted Patient Assistance Application to [employer] for DUPIXENT along with provider portion, patient portion, medication list, insurance card copy. PA approval never sent and per [employer], they are unable to generate PA approval. Will print CMM approval and test claim screenshot.  Will update patient when we receive a response.  Phone #: [phone] Fax #: [phone]  Chesley Mires, PharmD, MPH, BCPS, CPP Clinical Pharmacist (Rheumatology and Pulmonology)

## 2023-06-15 ENCOUNTER — Ambulatory Visit: Payer: HMO | Admitting: Urology

## 2023-06-19 NOTE — Progress Notes (Unsigned)
BCG Bladder Instillation  Patient wanted to restart his maintenance BCG after he completed his antibiotic for his upper respiratory infection.   BCG # 1/3  Due to Bladder Cancer patient is present today for a BCG treatment. Patient was cleaned and prepped in a sterile fashion with betadine. A 14 FR catheter was inserted, urine return was noted 30 ml, urine was yellow clear in color.  50ml of reconstituted BCG was instilled into the bladder. The catheter was then removed. Patient tolerated well, no complications were noted  Performed by: Michiel Cowboy, PA-C and Anastaysia Hopkiins, CMA   Follow up/ Additional notes: In 1 week for number 2 out of 3 BCG

## 2023-06-20 ENCOUNTER — Telehealth: Payer: Self-pay | Admitting: Pulmonary Disease

## 2023-06-20 MED ORDER — TRELEGY ELLIPTA 200-62.5-25 MCG/ACT IN AEPB: 1.0000 | INHALATION_SPRAY | Freq: Every day | RESPIRATORY_TRACT | Status: DC

## 2023-06-20 NOTE — Telephone Encounter (Signed)
One sample of trelegy has been placed up front for pickup. Patient is aware and voiced his understanding.  Nothing further needed.

## 2023-06-20 NOTE — Telephone Encounter (Signed)
Pt is asking for Trelegy samples to get thru the year.

## 2023-06-22 ENCOUNTER — Ambulatory Visit: Payer: HMO | Admitting: Urology

## 2023-06-22 VITALS — BP 122/66 | HR 78 | Ht 72.0 in | Wt 208.6 lb

## 2023-06-22 DIAGNOSIS — Z515 Encounter for palliative care: Secondary | ICD-10-CM | POA: Diagnosis not present

## 2023-06-22 DIAGNOSIS — Z7951 Long term (current) use of inhaled steroids: Secondary | ICD-10-CM | POA: Diagnosis not present

## 2023-06-22 DIAGNOSIS — J449 Chronic obstructive pulmonary disease, unspecified: Secondary | ICD-10-CM | POA: Diagnosis not present

## 2023-06-22 DIAGNOSIS — C679 Malignant neoplasm of bladder, unspecified: Secondary | ICD-10-CM | POA: Diagnosis not present

## 2023-06-22 DIAGNOSIS — D692 Other nonthrombocytopenic purpura: Secondary | ICD-10-CM | POA: Diagnosis not present

## 2023-06-22 DIAGNOSIS — I5020 Unspecified systolic (congestive) heart failure: Secondary | ICD-10-CM | POA: Diagnosis not present

## 2023-06-22 DIAGNOSIS — Z7982 Long term (current) use of aspirin: Secondary | ICD-10-CM | POA: Diagnosis not present

## 2023-06-22 DIAGNOSIS — G4733 Obstructive sleep apnea (adult) (pediatric): Secondary | ICD-10-CM | POA: Diagnosis not present

## 2023-06-22 DIAGNOSIS — Z6828 Body mass index (BMI) 28.0-28.9, adult: Secondary | ICD-10-CM | POA: Diagnosis not present

## 2023-06-22 DIAGNOSIS — N1831 Chronic kidney disease, stage 3a: Secondary | ICD-10-CM | POA: Diagnosis not present

## 2023-06-22 LAB — URINALYSIS, COMPLETE
Bilirubin, UA: NEGATIVE
Glucose, UA: NEGATIVE
Ketones, UA: NEGATIVE
Leukocytes,UA: NEGATIVE
Nitrite, UA: NEGATIVE
Protein,UA: NEGATIVE
RBC, UA: NEGATIVE
Specific Gravity, UA: 1.02 (ref 1.005–1.030)
Urobilinogen, Ur: 1 mg/dL (ref 0.2–1.0)
pH, UA: 6 (ref 5.0–7.5)

## 2023-06-22 LAB — MICROSCOPIC EXAMINATION

## 2023-06-22 MED ORDER — BCG LIVE 50 MG IS SUSR
3.2400 mL | Freq: Once | INTRAVESICAL | Status: AC
  Administered 2023-06-22: 81 mg via INTRAVESICAL

## 2023-06-22 NOTE — Addendum Note (Signed)
Addended by: Consuella Lose on: 06/22/2023 09:49 AM   Modules accepted: Orders

## 2023-06-23 ENCOUNTER — Telehealth: Payer: Self-pay | Admitting: Urology

## 2023-06-23 NOTE — Telephone Encounter (Signed)
I spoke with Mr. [name].  He states that his symptoms have now abated.  He is not having blood or pain this morning.

## 2023-06-26 NOTE — Progress Notes (Unsigned)
BCG Bladder Instillation  BCG # 2/3  Due to Bladder Cancer patient is present today for a BCG treatment. Patient was cleaned and prepped in a sterile fashion with betadine. A 14 FR catheter was inserted, urine return was noted 300 ml, urine was yellow clear in color.  50ml of reconstituted BCG was instilled into the bladder. The catheter was then removed. Patient tolerated well, no complications were noted  Performed by: Michiel Cowboy, PA-C   Follow up/ Additional notes: In 1 week for number 3 out of 3 BCG

## 2023-06-29 ENCOUNTER — Ambulatory Visit: Payer: HMO | Admitting: Urology

## 2023-06-29 ENCOUNTER — Encounter: Payer: Self-pay | Admitting: Urology

## 2023-06-29 VITALS — BP 131/67 | HR 71

## 2023-06-29 DIAGNOSIS — C679 Malignant neoplasm of bladder, unspecified: Secondary | ICD-10-CM | POA: Diagnosis not present

## 2023-06-29 LAB — URINALYSIS, COMPLETE
Bilirubin, UA: NEGATIVE
Glucose, UA: NEGATIVE
Ketones, UA: NEGATIVE
Leukocytes,UA: NEGATIVE
Nitrite, UA: NEGATIVE
Protein,UA: NEGATIVE
RBC, UA: NEGATIVE
Specific Gravity, UA: 1.015 (ref 1.005–1.030)
Urobilinogen, Ur: 1 mg/dL (ref 0.2–1.0)
pH, UA: 6.5 (ref 5.0–7.5)

## 2023-06-29 LAB — MICROSCOPIC EXAMINATION: Bacteria, UA: NONE SEEN

## 2023-06-29 MED ORDER — BCG LIVE 50 MG IS SUSR
3.2400 mL | Freq: Once | INTRAVESICAL | Status: AC
  Administered 2023-06-29: 81 mg via INTRAVESICAL

## 2023-06-29 NOTE — Addendum Note (Signed)
Addended byRanda [name] on: 06/29/2023 09:20 AM   Modules accepted: Orders

## 2023-07-04 ENCOUNTER — Other Ambulatory Visit: Payer: Self-pay | Admitting: Cardiovascular Disease

## 2023-07-05 NOTE — Progress Notes (Unsigned)
BCG Bladder Instillation  BCG # 3/3  Due to Bladder Cancer patient is present today for a BCG treatment. Patient was cleaned and prepped in a sterile fashion with betadine. A 14 FR catheter was inserted, urine return was noted 50 ml, urine was yellow in color.  50ml of reconstituted BCG was instilled into the bladder. The catheter was then removed. Patient tolerated well, no complications were noted  Performed by: Michiel Cowboy, PA-C and Humberta Magallon-Marchie, CMA   Follow up/ Additional notes: Follow up in two months for cysto with Dr. Lonna Cobb

## 2023-07-06 ENCOUNTER — Telehealth: Payer: Self-pay

## 2023-07-06 ENCOUNTER — Ambulatory Visit: Payer: HMO | Admitting: Urology

## 2023-07-06 DIAGNOSIS — C679 Malignant neoplasm of bladder, unspecified: Secondary | ICD-10-CM | POA: Diagnosis not present

## 2023-07-06 LAB — URINALYSIS, COMPLETE
Bilirubin, UA: NEGATIVE
Glucose, UA: NEGATIVE
Ketones, UA: NEGATIVE
Leukocytes,UA: NEGATIVE
Nitrite, UA: NEGATIVE
Protein,UA: NEGATIVE
RBC, UA: NEGATIVE
Specific Gravity, UA: 1.015 (ref 1.005–1.030)
Urobilinogen, Ur: 0.2 mg/dL (ref 0.2–1.0)
pH, UA: 5.5 (ref 5.0–7.5)

## 2023-07-06 LAB — MICROSCOPIC EXAMINATION: Bacteria, UA: NONE SEEN

## 2023-07-06 MED ORDER — TRELEGY ELLIPTA 200-62.5-25 MCG/ACT IN AEPB: 1.0000 | INHALATION_SPRAY | Freq: Every day | RESPIRATORY_TRACT | 0 refills | Status: DC

## 2023-07-06 MED ORDER — BCG LIVE 50 MG IS SUSR
3.2400 mL | Freq: Once | INTRAVESICAL | Status: AC
  Administered 2023-07-06: 81 mg via INTRAVESICAL

## 2023-07-06 NOTE — Telephone Encounter (Signed)
Patient came into the office asking for Trelegy samples.  Per Dr. Jayme Cloud, ok to give the patient samples.  Patient given samples  Nothing further needed.

## 2023-07-06 NOTE — Telephone Encounter (Signed)
Patient came into to office asking about the Dupixent? He said he did call about the call about the assistance program.

## 2023-07-11 ENCOUNTER — Telehealth: Payer: Self-pay | Admitting: Pulmonary Disease

## 2023-07-11 ENCOUNTER — Other Ambulatory Visit (HOSPITAL_COMMUNITY): Payer: Self-pay

## 2023-07-11 ENCOUNTER — Encounter: Payer: Self-pay | Admitting: Pulmonary Disease

## 2023-07-11 ENCOUNTER — Ambulatory Visit: Payer: HMO | Admitting: Pulmonary Disease

## 2023-07-11 VITALS — BP 120/70 | HR 75 | Temp 97.1°F | Ht 72.0 in | Wt 207.8 lb

## 2023-07-11 DIAGNOSIS — J4489 Other specified chronic obstructive pulmonary disease: Secondary | ICD-10-CM | POA: Diagnosis not present

## 2023-07-11 DIAGNOSIS — I429 Cardiomyopathy, unspecified: Secondary | ICD-10-CM

## 2023-07-11 DIAGNOSIS — J4540 Moderate persistent asthma, uncomplicated: Secondary | ICD-10-CM

## 2023-07-11 DIAGNOSIS — G4733 Obstructive sleep apnea (adult) (pediatric): Secondary | ICD-10-CM | POA: Diagnosis not present

## 2023-07-11 LAB — NITRIC OXIDE: Nitric Oxide: 52

## 2023-07-11 MED ORDER — TRELEGY ELLIPTA 200-62.5-25 MCG/ACT IN AEPB: 1.0000 | INHALATION_SPRAY | Freq: Every day | RESPIRATORY_TRACT | 0 refills | Status: DC

## 2023-07-11 NOTE — Telephone Encounter (Signed)
Patient is in the office. He said he has not heard anything back about Dupixent.   Pharmacy team please advise.

## 2023-07-11 NOTE — Progress Notes (Signed)
Subjective:    Patient ID: [name], male    DOB: January 18, 1941, 82 y.o.   MRN: [id number]  Patient Care Team: Eustaquio Boyden, MD as PCP - General (Family Medicine) Mariah Milling Tollie Pizza, MD as PCP - Cardiology (Cardiology) Vilinda Flake, [facility] (Inactive) as Pharmacist (Pharmacist) Antonieta Iba, MD as Consulting Physician (Cardiology) Salena Saner, MD as Consulting Physician (Pulmonary Disease) Coralyn Helling, MD (Inactive) as Consulting Physician (Pulmonary Disease) Iran Ouch, MD as Consulting Physician (Cardiology)  Chief Complaint  Patient presents with   Follow-up    Occasional SOB. No wheezing. Cough with yellow/white sputum.     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 Patient was last seen on 23 May 2023.  At that time he was prescribed Dupixent.  He has yet to get the medication.  HPI Discussed the use of AI scribe software for clinical note transcription with the patient, who gave verbal consent to proceed.  History of Present Illness   The patient, with a history of moderate to severe persistent asthma, COPD, and obstructive sleep apnea, reports an increase in cough. The patient has been experiencing difficulties due to environmental factors such as smoke from burning leaves and mold, which cause a burning sensation in his chest.  Regarding his sleep apnea, the patient has been experimenting with different types of nasal masks for his CPAP machine and has found a DreamWear full face medium-sized mask to be most effective. He is currently testing two different kinds to determine which one works best for him.  The patient's breathing overall is reported as "pretty good," but he does experience frequent coughing spells and expectorate phlegm, which he attributes to his asthma. He is  hopeful that the initiation of Dupixent will improve these symptoms.  The patient is currently on Trelegy and has enough medication to last until January. He has been using samples but plans to switch to a prescription next year to avoid falling into the "donut hole" of his insurance coverage. He plans to use a Recruitment consultant in [place] for his medication needs.  The patient has received his flu shot and RSV vaccine.   CPAP compliance is excellent.  Usage 30 out of 30 days 100%.  Over 4 hours 97%.  Some leaks detected but patient is adjusting to different masks.  Residual AHI 5.6.  Patient is on AutoSet 5 cm H2O to 15 cm H2O.  Tolerating well.     Review of Systems A 10 point review of systems was performed and it is as noted above otherwise negative.   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   OSA (obstructive sleep apnea)    Chronic heart failure with preserved ejection fraction (HFpEF) (HCC)    Right leg pain 06/22/2021   Nocturnal hypoxia 06/11/2021   CKD (chronic kidney disease) stage 3, GFR 30-59 ml/min (HCC) 02/24/2021   Peripheral neuropathy 02/23/2021   Spondylolisthesis of lumbar region 07/07/2020   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  Coccydynia 09/20/2019   Nasal sinus congestion 07/19/2019   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   Anxiety 06/04/2019   Chronic right shoulder pain 09/10/2018   Cervical stenosis of spinal canal 06/05/2017   Pedal edema 06/05/2017   Health maintenance examination 12/06/2016   Lumbar stenosis with neurogenic claudication  07/13/2016   Overweight (BMI 25.0-29.9) 07/04/2016   Low vitamin B12 level 01/01/2016   Exertional dyspnea 07/07/2015   Unstable angina (HCC)    Medicare annual wellness visit, subsequent 07/03/2015   Advanced care planning/counseling discussion 07/03/2015   Type 2 diabetes mellitus with other specified complication (HCC) 05/04/2015   Pleuritic chest pain 05/04/2015   Ex-smoker 05/04/2015   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   Spermatocele 04/02/2013   Syncopal vertigo 11/04/2010   Lumbar disc disease with radiculopathy 11/04/2010   Coronary artery disease involving native coronary artery of native heart with unstable angina pectoris (HCC) 08/11/2009   PAD (peripheral artery disease) (HCC) 08/11/2009   DUPUYTREN'S CONTRACTURE, RIGHT 10/29/2008   Carotid stenosis 07/09/2008   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/2008   Benign prostatic hyperplasia with urinary obstruction 09/06/2007   Essential hypertension 04/30/2007   GERD 04/30/2007   Hyperlipidemia associated with type 2 diabetes mellitus (HCC) 04/26/2007   OA (osteoarthritis) of knee 04/26/2007   DVT, HX OF 04/26/2007    Social History   Tobacco Use   Smoking status: Former    Current packs/day: 0.00    Average packs/day: 1 pack/day for 25.0 years (25.0 ttl pk-yrs)    Types: Cigarettes    Start date: 08/16/1963    Quit date: 08/15/1988    Years since quitting: 34.9    Passive exposure: Past   Smokeless tobacco: Never  Substance Use Topics   Alcohol use: No    Alcohol/week: 0.0 standard drinks of alcohol    Allergies  Allergen Reactions   Vioxx [Rofecoxib] Other (See Comments)    Hemorrhage    Spiriva Respimat [Tiotropium Bromide Monohydrate] Other (See Comments)    Elevated bp   Contrast Media [Iodinated Contrast Media] Itching and Rash    Delayed reaction post abdominal aortagram.    Morphine Nausea Only and Other (See Comments)    Irritability     Current Meds  Medication Sig   albuterol (PROVENTIL) (2.5  MG/3ML) 0.083% nebulizer solution Take 3 mLs (2.5 mg total) by nebulization every 6 (six) hours as needed for wheezing or shortness of breath.   albuterol (VENTOLIN HFA) 108 (90 Base) MCG/ACT inhaler TAKE 2 PUFFS INTO LUNGS EVERY 6 HOURS ASNEEDED FOR WHEEZING OR SHORTNESS OF BREATH   aspirin EC 81 MG EC tablet Take 1 tablet (81 mg total) by mouth daily.   atorvastatin (LIPITOR) 20 MG tablet TAKE 1 TABLET BY MOUTH DAILY   Cholecalciferol (VITAMIN D3) 25 MCG (1000 UT) CAPS Take 1 capsule (1,000 Units total) by mouth 2 (two) times a week.   clopidogrel (PLAVIX) 75 MG tablet Take 1 tablet (75 mg total) by mouth daily with breakfast.   cyclobenzaprine (FLEXERIL) 5 MG tablet Take 1 tablet (5 mg total) by mouth 3 (three) times daily as needed for muscle spasms.   docusate sodium (COLACE) 100 MG capsule Take 1 capsule (100 mg total) by mouth daily as needed for mild constipation.   ezetimibe (ZETIA) 10 MG tablet Take 1 tablet (10 mg total) by mouth daily.   famotidine (PEPCID) 20 MG tablet Take 20 mg by mouth daily.   fluticasone (FLONASE) 50 MCG/ACT nasal spray Place 1 spray into both nostrils  daily as needed for allergies or rhinitis.   Fluticasone-Umeclidin-Vilant (TRELEGY ELLIPTA) 200-62.5-25 MCG/ACT AEPB Inhale 1 puff into the lungs daily.   Fluticasone-Umeclidin-Vilant (TRELEGY ELLIPTA) 200-62.5-25 MCG/ACT AEPB Inhale 1 puff into the lungs daily.   furosemide (LASIX) 40 MG tablet Take 0.5 tablets (20 mg total) by mouth daily. Take 1 tablet (40 mg) daily as need for weight greater then 208.   gabapentin (NEURONTIN) 600 MG tablet Take 1 tablet (600 mg total) by mouth 3 (three) times daily.   HYDROcodone-acetaminophen (NORCO/VICODIN) 5-325 MG tablet Take 1-2 tablets by mouth every 4 (four) hours as needed for moderate pain.   hydrocortisone 2.5 % cream Apply topically 2 (two) times daily as needed (Rash).   isosorbide mononitrate (IMDUR) 30 MG 24 hr tablet Take 1 tablet (30 mg total) by mouth daily.    ketoconazole (NIZORAL) 2 % cream Apply 1 Application topically 2 (two) times daily.   losartan (COZAAR) 25 MG tablet TAKE 1 TABLET BY MOUTH DAILY   montelukast (SINGULAIR) 10 MG tablet TAKE 1 TABLET BY MOUTH DAILY   mupirocin ointment (BACTROBAN) 2 % Apply 1 Application topically 2 (two) times daily.   nitroGLYCERIN (NITROSTAT) 0.4 MG SL tablet Place 1 tablet (0.4 mg total) under the tongue every 5 (five) minutes as needed for chest pain.   polyethylene glycol (MIRALAX / GLYCOLAX) 17 g packet Take 17 g by mouth daily as needed for mild constipation.    ranolazine (RANEXA) 500 MG 12 hr tablet TAKE ONE (1) TABLET BY MOUTH TWO TIMES PER DAY   silodosin (RAPAFLO) 8 MG CAPS capsule TAKE ONE CAPSULE BY MOUTH DAILY WITH BREAKFAST.   tamsulosin (FLOMAX) 0.4 MG CAPS capsule    Vibegron (GEMTESA) 75 MG TABS Take 1 tablet (75 mg total) by mouth daily.    Immunization History  Administered Date(s) Administered   Fluad Quad(high Dose 65+) 04/17/2019, 04/15/2022, 05/08/2023   Influenza Whole 08/15/2000, 07/04/2007, 05/09/2008, 05/31/2010   Influenza, High Dose Seasonal PF 05/15/2013, 06/25/2015, 07/05/2021   Influenza,inj,Quad PF,6+ Mos 04/17/2014, 05/18/2016, 06/07/2018   Influenza-Unspecified 05/22/2017, 05/28/2020   PFIZER(Purple Top)SARS-COV-2 Vaccination 09/07/2019, 09/28/2019, 05/12/2020, 01/19/2021   Pneumococcal Conjugate-13 02/09/2015   Pneumococcal Polysaccharide-23 08/15/2000, 10/29/2008   Respiratory Syncytial Virus Vaccine,Recomb Aduvanted(Arexvy) 10/10/2022   Td 08/15/2005   Tdap 02/10/2018   Zoster Recombinant(Shingrix) 04/05/2017, 07/25/2017   Zoster, Live 02/04/2009        Objective:     BP 120/70 (BP Location: Right Arm, Cuff Size: Large)   Pulse 75   Temp (!) 97.1 F (36.2 C)   Ht 6' (1.829 m)   Wt 207 lb 12.8 oz (94.3 kg)   SpO2 97%   BMI 28.18 kg/m   SpO2: 97 % O2 Device: None (Room air)  GENERAL: This is a well-developed, overweight gentleman, chronically  ill-appearing, awake, alert, no acute distress.  Fully ambulatory. HEAD: Normocephalic, atraumatic.  EYES: Pupils equal, round, reactive to light.  No scleral icterus.  MOUTH: Nose/mouth/throat not examined due to masking requirements per hospital guidelines. NECK: Supple. No thyromegaly. Trachea midline. No JVD.  No adenopathy.  Anterior cervical fusion incision, healed. PULMONARY: Good air entry bilaterally, coarse breath sounds otherwise, no adventitious sounds.  CARDIOVASCULAR: S1 and S2. Regular rate and rhythm.  Grade 1/6 systolic ejection murmur left sternal border. GASTROINTESTINAL: Protuberant abdomen, soft, no tenderness.   MUSCULOSKELETAL: No joint deformity, no clubbing, no edema.  NEUROLOGIC: No overt focal deficits noted.  Speech is fluent.   SKIN: Intact,warm,dry.  Limited exam shows no rashes.  He  has multiple purpura and ecchymoses in the upper extremities. PSYCH: Mood and behavior normal.  Lab Results  Component Value Date   NITRICOXIDE 52 07/11/2023  *43>>52  Assessment & Plan:     ICD-10-CM   1. Moderate persistent asthma without complication - Moderate/Severe  J45.40 Nitric oxide    2. COPD with asthma (HCC)  J44.89     3. Cardiomyopathy, unspecified type (HCC)  I42.9    LVEF 35 to 40% by cardiac cath 13 Jan 2023 This issue adds complexity to his management Follows with cardiology    4. OSA (obstructive sleep apnea)  G47.33       Orders Placed This Encounter  Procedures   Nitric oxide   Discussion:    Moderate to Severe Persistent Asthma Moderate to severe persistent asthma with elevated inflammation (nitric oxide level of 52). Symptoms include coughing spells and phlegm production. Approved for Dupixent to reduce inflammation and improve symptoms. Current management with Trelegy effective until January. - Expedite Dupixent approval with pharmacy - Continue Trelegy until January - Prescribe 90-day supply of Trelegy starting next year - Follow up in  three months  Chronic Obstructive Pulmonary Disease (COPD) COPD exacerbated by smoke exposure, causing discomfort and pain. - Advise to avoid smoke and respiratory irritants  Obstructive Sleep Apnea Obstructive sleep apnea managed with CPAP. Currently testing mask sizes, satisfied with DreamWear mask. - Continue CPAP with preferred mask - Order preferred mask once best fit is determined  General Health Maintenance Received flu and RSV vaccinations. - No additional action required  Follow-up - Schedule follow-up in three months.      Gailen Shelter, MD Advanced Bronchoscopy PCCM [facility]    *This note was generated using voice recognition software/Dragon and/or AI transcription program.  Despite best efforts to proofread, errors can occur which can change the meaning. Any transcriptional errors that result from this process are unintentional and may not be fully corrected at the time of dictation.

## 2023-07-11 NOTE — Telephone Encounter (Signed)
Callling back about his dupixent

## 2023-07-11 NOTE — Patient Instructions (Signed)
VISIT SUMMARY:  During today's visit, we discussed your asthma, COPD, and sleep apnea. You reported increased inflammation and difficulties due to environmental factors. We also reviewed your current medications and vaccination status.  YOUR PLAN:  -MODERATE TO SEVERE PERSISTENT ASTHMA: Asthma is a condition where your airways become inflamed and narrow, making it hard to breathe. Your inflammation levels are elevated, and you are experiencing coughing and phlegm. We will expedite the approval for Dupixent to help reduce inflammation and improve your symptoms. Continue using Trelegy until January, and we will prescribe a 90-day supply starting next year. Follow up in three months.  -CHRONIC OBSTRUCTIVE PULMONARY DISEASE (COPD): COPD is a chronic lung disease that makes it hard to breathe. Your symptoms are worsened by smoke exposure. Please avoid smoke and other respiratory irritants to help manage your condition.  -OBSTRUCTIVE SLEEP APNEA: Obstructive sleep apnea is a condition where your breathing stops and starts during sleep. You are currently testing different CPAP masks and have found the DreamWear mask to be effective. Continue using your CPAP with the preferred mask, and we will order the best-fitting mask once you decide which one works best.  -GENERAL HEALTH MAINTENANCE: You have received your flu and RSV vaccinations. No additional action is required at this time.  INSTRUCTIONS:  Please schedule a follow-up appointment in three months.

## 2023-07-11 NOTE — Progress Notes (Signed)
Spoke with patient and asked him to contact Dupixent [employer] to sign up for the patient assistance program. He states he had called them previously and had asked for the same thing but was hung up on by an agent. He agreed to contact the program once more. He was told to explicitly state he was interested in the patient assistance program and he could not afford his copay for Dupixent.   Phone #: [phone] Fax #: [phone]  Sofie Rower, PharmD [facility] PGY-1

## 2023-07-12 NOTE — Telephone Encounter (Signed)
Patient called back and said he spoke with someone regarding his Dupixent and they said his paperwork was not filled out all the way. They are needing his eosinophil count on the paperwork..  Pharmacy team please advise.

## 2023-07-12 NOTE — Telephone Encounter (Signed)
See telephone encounter from 06/08/23 regarding his Dupixent.  Closing this encounter.

## 2023-07-14 DIAGNOSIS — I5020 Unspecified systolic (congestive) heart failure: Secondary | ICD-10-CM | POA: Diagnosis not present

## 2023-07-14 DIAGNOSIS — I889 Nonspecific lymphadenitis, unspecified: Secondary | ICD-10-CM | POA: Diagnosis not present

## 2023-07-14 DIAGNOSIS — E1151 Type 2 diabetes mellitus with diabetic peripheral angiopathy without gangrene: Secondary | ICD-10-CM | POA: Diagnosis not present

## 2023-07-14 DIAGNOSIS — I130 Hypertensive heart and chronic kidney disease with heart failure and stage 1 through stage 4 chronic kidney disease, or unspecified chronic kidney disease: Secondary | ICD-10-CM | POA: Diagnosis not present

## 2023-07-14 DIAGNOSIS — E1122 Type 2 diabetes mellitus with diabetic chronic kidney disease: Secondary | ICD-10-CM | POA: Diagnosis not present

## 2023-07-14 DIAGNOSIS — J4489 Other specified chronic obstructive pulmonary disease: Secondary | ICD-10-CM | POA: Diagnosis not present

## 2023-07-17 NOTE — Telephone Encounter (Signed)
Received return call from [employer] regarding PAP.  Per rep, eosinophil count is only needed for quick start program. Provided over the phone.  Chesley Mires, PharmD, MPH, BCPS, CPP Clinical Pharmacist (Rheumatology and Pulmonology)

## 2023-07-17 NOTE — Progress Notes (Signed)
Received return call from [employer] regarding PAP.  Per rep, eosinophil count is only needed for quick start program. Provided over the phone. He will move application forward for PAP processing.  Chesley Mires, PharmD, MPH, BCPS, CPP Clinical Pharmacist (Rheumatology and Pulmonology)

## 2023-07-17 NOTE — Telephone Encounter (Addendum)
Called [employer] for PAP update. Hold time extended. Will f/u tomorrow  Chesley Mires, PharmD, MPH, BCPS, CPP Clinical Pharmacist (Rheumatology and Pulmonology)

## 2023-07-19 ENCOUNTER — Telehealth: Payer: Self-pay | Admitting: Cardiovascular Disease

## 2023-07-19 DIAGNOSIS — M25511 Pain in right shoulder: Secondary | ICD-10-CM | POA: Diagnosis not present

## 2023-07-19 NOTE — Telephone Encounter (Signed)
   Pre-operative Risk Assessment    Patient Name: [name]  DOB: 11/14/1940 MRN: [id number]      Request for Surgical Clearance    Procedure:   denture, cleaning/periodontal treatment  Date of Surgery:  Clearance TBD                                 Surgeon:  not indicated Surgeon's Group or Practice Name:  [facility] Phone number:  [phone] Fax number:  [phone]   Type of Clearance Requested:   - Medical    Type of Anesthesia:  Not Indicated   Additional requests/questions:    [name]   07/19/2023, 4:33 PM

## 2023-07-19 NOTE — Telephone Encounter (Signed)
    Primary Cardiologist: Julien Nordmann, MD  Chart reviewed as part of pre-operative protocol coverage. Simple dental extractions are considered low risk procedures per guidelines and generally do not require any specific cardiac clearance. It is also generally accepted that for simple extractions and dental cleanings, there is no need to interrupt blood thinner therapy.   SBE prophylaxis is not required for the patient.  I will route this recommendation to the requesting party via Epic fax function and remove from pre-op pool.  Please call with questions.  Ronney Asters, NP 07/19/2023, 4:48 PM

## 2023-07-20 DIAGNOSIS — Z09 Encounter for follow-up examination after completed treatment for conditions other than malignant neoplasm: Secondary | ICD-10-CM | POA: Diagnosis not present

## 2023-07-27 ENCOUNTER — Other Ambulatory Visit: Payer: HMO | Admitting: Urology

## 2023-07-31 ENCOUNTER — Other Ambulatory Visit: Payer: Self-pay | Admitting: Cardiovascular Disease

## 2023-08-01 ENCOUNTER — Other Ambulatory Visit: Payer: Self-pay | Admitting: Pulmonary Disease

## 2023-08-02 ENCOUNTER — Ambulatory Visit (INDEPENDENT_AMBULATORY_CARE_PROVIDER_SITE_OTHER): Payer: HMO | Admitting: Family Medicine

## 2023-08-02 ENCOUNTER — Encounter: Payer: Self-pay | Admitting: Family Medicine

## 2023-08-02 VITALS — BP 136/78 | HR 65 | Temp 97.7°F | Ht 72.0 in | Wt 208.5 lb

## 2023-08-02 DIAGNOSIS — C679 Malignant neoplasm of bladder, unspecified: Secondary | ICD-10-CM | POA: Diagnosis not present

## 2023-08-02 DIAGNOSIS — R519 Headache, unspecified: Secondary | ICD-10-CM

## 2023-08-02 DIAGNOSIS — M48062 Spinal stenosis, lumbar region with neurogenic claudication: Secondary | ICD-10-CM | POA: Diagnosis not present

## 2023-08-02 DIAGNOSIS — R001 Bradycardia, unspecified: Secondary | ICD-10-CM | POA: Diagnosis not present

## 2023-08-02 MED ORDER — AMOXICILLIN-POT CLAVULANATE 875-125 MG PO TABS: 1.0000 | ORAL_TABLET | Freq: Two times a day (BID) | ORAL | 0 refills | Status: DC

## 2023-08-02 NOTE — Progress Notes (Signed)
Email sent to Delle Reining for case update

## 2023-08-02 NOTE — Patient Instructions (Addendum)
Possible ongoing sinus inflammation/infection - I've printed augmentin course to fill if your facial pain worsens, ear pain worsens or new fever or purulent mucous.  Drink plenty of water, continue nasal saline irrigation. May use flonase as needed as well.  Let us know if any worsening symptoms.

## 2023-08-02 NOTE — Assessment & Plan Note (Signed)
Appreciate urological care.

## 2023-08-02 NOTE — Assessment & Plan Note (Addendum)
Intermittent, noted while at rest on his apple watch.  In office today heart rate is stable in 60s.  He will discuss this with cardiology at upcoming appointment

## 2023-08-02 NOTE — Assessment & Plan Note (Signed)
Ongoing sinus pressure headache, R frontal facial pain, L ear pain in setting of recent acute sinusitis treatment with clindamycin course by [facility] practitioner. Overall reassuring exam.  He is noting improvement in symptoms over the last 2 days.  Continue supportive measures including nasal saline, singulair, flonase (limited by nosebleeds), discussed tylenol use. WASP for Augmentin printed out and provided to patient with indication when to fill.  Update if not improving with this.

## 2023-08-02 NOTE — Assessment & Plan Note (Signed)
S/p redo laminectomy 06/2020.  Ongoing lower back pain  Released from neurosurgery care.

## 2023-08-02 NOTE — Progress Notes (Signed)
Ph: [phone] Fax: [phone]   Patient ID: [name], male    DOB: 1941/07/14, 82 y.o.   MRN: [id number]  This visit was conducted in person.  BP 136/78   Pulse 65   Temp 97.7 F (36.5 C) (Oral)   Ht 6' (1.829 m)   Wt 208 lb 8 oz (94.6 kg)   SpO2 97%   BMI 28.28 kg/m    CC: HA, L ear pain  Subjective:   HPI: [name] is a 82 y.o. male presenting on 08/02/2023 for Sinus Problem (C/o HA, facial pain and L ear pain. Denies fever, body aches or ST. Sxs started about 2 wks ago for similar sxs- tx clindamycin. )   2-3 wk h/o headache, facial pain and left ear pain. Seen by [facility] home visit 07/14/2023 treated with clindamycin 300mg  TID 10d course for acute sinusitis. L earache, stopped up, muffled hearing. Woke up this morning with bad R sided headache. Keeps cough and chest congestion as per below.  No fevers/chills, tooth pain, PNDrainage, ST, head > chest congestion.  Has tympanostomy tube present to left ear.  Worsened lower back pain as well over last 3 weeks. Took tylenol 1000mg  at 6:30am this morning which has improved headache.  No vision changes, no pain with eye movement.   Continues nasal saline irrigation and daily singulair.  Flonase use limited by nosebleeds.   Previous sinusitis treatment with Augmentin course late 05/2023.   He notes heart rate can drop to 40s - based on his apple watch readings. This can happen while at rest sitting in his recliner. More fatigued recently. Next cardiology appt is 08/11/2023 - he will discuss with cardiologist at upcoming visit.   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  Notes smoke causes exacerbations.  OSA continues CPAP.   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      Relevant past medical, surgical, family and  social history reviewed and updated as indicated. Interim medical history since our last visit reviewed. Allergies and medications reviewed and updated. Outpatient Medications Prior to Visit  Medication Sig Dispense Refill   albuterol (PROVENTIL) (2.5 MG/3ML) 0.083% nebulizer solution Take 3 mLs (2.5 mg total) by nebulization every 6 (six) hours as needed for wheezing or shortness of breath. 75 mL 12   albuterol (VENTOLIN HFA) 108 (90 Base) MCG/ACT inhaler TAKE 2 PUFFS INTO LUNGS EVERY 6 HOURS ASNEEDED FOR WHEEZING OR SHORTNESS OF BREATH 8.5 g 6   aspirin EC 81 MG EC tablet Take 1 tablet (81 mg total) by mouth daily. 30 tablet 0   atorvastatin (LIPITOR) 20 MG tablet TAKE 1 TABLET BY MOUTH DAILY 90 tablet 0   Cholecalciferol (VITAMIN D3) 25 MCG (1000 UT) CAPS Take 1 capsule (1,000 Units total) by mouth 2 (two) times a week.     clopidogrel (PLAVIX) 75 MG tablet Take 1 tablet (75 mg total) by mouth daily with breakfast. 90 tablet 3   cyclobenzaprine (FLEXERIL) 5 MG tablet Take 1 tablet (5 mg total) by mouth 3 (three) times daily as needed for muscle spasms. 30 tablet 0   docusate sodium (COLACE) 100 MG capsule Take 1 capsule (100 mg total) by mouth daily as needed for mild constipation.     ezetimibe (ZETIA) 10 MG tablet Take 1 tablet (10 mg total) by mouth daily. 90 tablet  3   famotidine (PEPCID) 20 MG tablet Take 20 mg by mouth daily.     fluticasone (FLONASE) 50 MCG/ACT nasal spray Place 1 spray into both nostrils daily as needed for allergies or rhinitis.     Fluticasone-Umeclidin-Vilant (TRELEGY ELLIPTA) 200-62.5-25 MCG/ACT AEPB Inhale 1 puff into the lungs daily. 14 each 0   furosemide (LASIX) 40 MG tablet Take 0.5 tablets (20 mg total) by mouth daily. Take 1 tablet (40 mg) daily as need for weight greater then 208. 180 tablet 3   gabapentin (NEURONTIN) 600 MG tablet Take 1 tablet (600 mg total) by mouth 3 (three) times daily. 270 tablet 4   HYDROcodone-acetaminophen (NORCO/VICODIN) 5-325 MG  tablet Take 1-2 tablets by mouth every 4 (four) hours as needed for moderate pain. 30 tablet 0   hydrocortisone 2.5 % cream Apply topically 2 (two) times daily as needed (Rash). 30 g 5   isosorbide mononitrate (IMDUR) 30 MG 24 hr tablet Take 1 tablet (30 mg total) by mouth daily. 90 tablet 3   losartan (COZAAR) 25 MG tablet TAKE 1 TABLET BY MOUTH DAILY 90 tablet 0   montelukast (SINGULAIR) 10 MG tablet TAKE 1 TABLET BY MOUTH DAILY 30 tablet 11   mupirocin ointment (BACTROBAN) 2 % Apply 1 Application topically 2 (two) times daily. 22 g 0   nitroGLYCERIN (NITROSTAT) 0.4 MG SL tablet Place 1 tablet (0.4 mg total) under the tongue every 5 (five) minutes as needed for chest pain. 25 tablet 3   polyethylene glycol (MIRALAX / GLYCOLAX) 17 g packet Take 17 g by mouth daily as needed for mild constipation.      ranolazine (RANEXA) 500 MG 12 hr tablet TAKE ONE (1) TABLET BY MOUTH TWO TIMES PER DAY 180 tablet 3   tamsulosin (FLOMAX) 0.4 MG CAPS capsule      silodosin (RAPAFLO) 8 MG CAPS capsule TAKE ONE CAPSULE BY MOUTH DAILY WITH BREAKFAST. 30 capsule 0   Vibegron (GEMTESA) 75 MG TABS Take 1 tablet (75 mg total) by mouth daily. 42 tablet 0   carvedilol (COREG) 3.125 MG tablet Take 1 tablet (3.125 mg total) by mouth 2 (two) times daily. 180 tablet 3   No facility-administered medications prior to visit.     Per HPI unless specifically indicated in ROS section below Review of Systems  Objective:  BP 136/78   Pulse 65   Temp 97.7 F (36.5 C) (Oral)   Ht 6' (1.829 m)   Wt 208 lb 8 oz (94.6 kg)   SpO2 97%   BMI 28.28 kg/m   Wt Readings from Last 3 Encounters:  08/02/23 208 lb 8 oz (94.6 kg)  07/11/23 207 lb 12.8 oz (94.3 kg)  06/22/23 208 lb 9 oz (94.6 kg)      Physical Exam Vitals and nursing note reviewed.  Constitutional:      Appearance: Normal appearance. He is not ill-appearing.  HENT:     Head: Normocephalic and atraumatic.     Right Ear: Hearing, tympanic membrane, ear canal and  external ear normal. There is no impacted cerumen.     Left Ear: Hearing, tympanic membrane, ear canal and external ear normal. There is no impacted cerumen. A PE tube is present.     Nose: Mucosal edema and congestion present. No rhinorrhea.     Right Turbinates: Swollen. Not enlarged or pale.     Left Turbinates: Swollen. Not enlarged or pale.     Right Sinus: No maxillary sinus tenderness or frontal sinus tenderness.  Left Sinus: No maxillary sinus tenderness or frontal sinus tenderness.     Mouth/Throat:     Mouth: Mucous membranes are moist.     Pharynx: Oropharynx is clear. No oropharyngeal exudate or posterior oropharyngeal erythema.  Eyes:     Extraocular Movements: Extraocular movements intact.     Conjunctiva/sclera: Conjunctivae normal.     Pupils: Pupils are equal, round, and reactive to light.  Cardiovascular:     Rate and Rhythm: Normal rate and regular rhythm.     Pulses: Normal pulses.     Heart sounds: Normal heart sounds. No murmur heard. Pulmonary:     Effort: Pulmonary effort is normal. No respiratory distress.     Breath sounds: Normal breath sounds. No wheezing, rhonchi or rales.  Musculoskeletal:     Cervical back: Normal range of motion and neck supple. No rigidity.     Right lower leg: No edema.     Left lower leg: No edema.  Lymphadenopathy:     Cervical: No cervical adenopathy.  Skin:    General: Skin is warm and dry.     Findings: No rash.  Neurological:     Mental Status: He is alert.  Psychiatric:        Mood and Affect: Mood normal.        Behavior: Behavior normal.       Results for orders placed or performed in visit on 07/11/23  Nitric oxide   Collection Time: 07/11/23  9:44 AM  Result Value Ref Range   Nitric Oxide 52    *Note: Due to a large number of results and/or encounters for the requested time period, some results have not been displayed. A complete set of results can be found in Results Review.   Lumbar MRI IMPRESSION  01/02/2022: 1. Unchanged moderate right and severe left L5-S1 neural foraminal stenosis. 2. Unchanged mild bilateral L3-4 neural foraminal stenosis. 3. L4-5 PLIF with posterior decompression. Left lateral recess narrowing is unchanged but spinal canal patency is greatly improved   Assessment & Plan:   Problem List Items Addressed This Visit     Lumbar stenosis with neurogenic claudication   S/p redo laminectomy 06/2020.  Ongoing lower back pain  Released from neurosurgery care.       Urothelial carcinoma of bladder [facility])   Appreciate urological care.       Relevant Medications   amoxicillin-clavulanate (AUGMENTIN) 875-125 MG tablet   Sinus headache - Primary   Ongoing sinus pressure headache, R frontal facial pain, L ear pain in setting of recent acute sinusitis treatment with clindamycin course by [facility] practitioner. Overall reassuring exam.  He is noting improvement in symptoms over the last 2 days.  Continue supportive measures including nasal saline, singulair, flonase (limited by nosebleeds), discussed tylenol use. WASP for Augmentin printed out and provided to patient with indication when to fill.  Update if not improving with this.       Bradycardia   Intermittent, noted while at rest on his apple watch.  In office today heart rate is stable in 60s.  He will discuss this with cardiology at upcoming appointment         Meds ordered this encounter  Medications   amoxicillin-clavulanate (AUGMENTIN) 875-125 MG tablet    Sig: Take 1 tablet by mouth 2 (two) times daily for 10 days.    Dispense:  20 tablet    Refill:  0    No orders of the defined types were placed in this encounter.  Patient Instructions  Possible ongoing sinus inflammation/infection - I've printed augmentin course to fill if your facial pain worsens, ear pain worsens or new fever or purulent mucous.  Drink plenty of water, continue nasal saline irrigation. May use flonase as needed as  well.  Let us know if any worsening symptoms.   Follow up plan: No follow-ups on file.  Eustaquio Boyden, MD

## 2023-08-03 ENCOUNTER — Other Ambulatory Visit: Payer: Self-pay | Admitting: Pharmacist

## 2023-08-03 DIAGNOSIS — J4540 Moderate persistent asthma, uncomplicated: Secondary | ICD-10-CM

## 2023-08-03 DIAGNOSIS — J4489 Other specified chronic obstructive pulmonary disease: Secondary | ICD-10-CM

## 2023-08-03 MED ORDER — DUPIXENT 300 MG/2ML ~~LOC~~ SOAJ: 300.0000 mg | SUBCUTANEOUS | 0 refills | Status: DC

## 2023-08-03 NOTE — Progress Notes (Signed)
Per Delle Reining, Rx for Dupixent is needed to process PAP. Sent to [facility] today electronicalyl  Chesley Mires, PharmD, MPH, BCPS, CPP Clinical Pharmacist (Rheumatology and Pulmonology)

## 2023-08-10 ENCOUNTER — Telehealth: Payer: Self-pay | Admitting: Pulmonary Disease

## 2023-08-10 DIAGNOSIS — H9202 Otalgia, left ear: Secondary | ICD-10-CM | POA: Diagnosis not present

## 2023-08-10 DIAGNOSIS — G4733 Obstructive sleep apnea (adult) (pediatric): Secondary | ICD-10-CM | POA: Diagnosis not present

## 2023-08-10 DIAGNOSIS — R519 Headache, unspecified: Secondary | ICD-10-CM | POA: Diagnosis not present

## 2023-08-10 DIAGNOSIS — I10 Essential (primary) hypertension: Secondary | ICD-10-CM | POA: Diagnosis not present

## 2023-08-10 NOTE — Telephone Encounter (Signed)
Looking into Dupixent start hasn't heard anything

## 2023-08-10 NOTE — Telephone Encounter (Signed)
Pharmacy team, please advise. Thanks 

## 2023-08-11 ENCOUNTER — Ambulatory Visit: Payer: HMO | Attending: Physician Assistant | Admitting: Emergency Medicine

## 2023-08-11 ENCOUNTER — Encounter: Payer: Self-pay | Admitting: Physician Assistant

## 2023-08-11 VITALS — BP 138/72 | HR 82 | Ht 72.0 in | Wt 202.6 lb

## 2023-08-11 DIAGNOSIS — I5020 Unspecified systolic (congestive) heart failure: Secondary | ICD-10-CM | POA: Diagnosis not present

## 2023-08-11 DIAGNOSIS — E782 Mixed hyperlipidemia: Secondary | ICD-10-CM | POA: Diagnosis not present

## 2023-08-11 DIAGNOSIS — I739 Peripheral vascular disease, unspecified: Secondary | ICD-10-CM | POA: Diagnosis not present

## 2023-08-11 DIAGNOSIS — Z79899 Other long term (current) drug therapy: Secondary | ICD-10-CM | POA: Diagnosis not present

## 2023-08-11 DIAGNOSIS — J449 Chronic obstructive pulmonary disease, unspecified: Secondary | ICD-10-CM | POA: Diagnosis not present

## 2023-08-11 DIAGNOSIS — I5022 Chronic systolic (congestive) heart failure: Secondary | ICD-10-CM | POA: Diagnosis not present

## 2023-08-11 DIAGNOSIS — E785 Hyperlipidemia, unspecified: Secondary | ICD-10-CM | POA: Diagnosis not present

## 2023-08-11 DIAGNOSIS — I6523 Occlusion and stenosis of bilateral carotid arteries: Secondary | ICD-10-CM

## 2023-08-11 DIAGNOSIS — I255 Ischemic cardiomyopathy: Secondary | ICD-10-CM

## 2023-08-11 DIAGNOSIS — I2510 Atherosclerotic heart disease of native coronary artery without angina pectoris: Secondary | ICD-10-CM | POA: Diagnosis not present

## 2023-08-11 DIAGNOSIS — I10 Essential (primary) hypertension: Secondary | ICD-10-CM | POA: Diagnosis not present

## 2023-08-11 NOTE — Patient Instructions (Signed)
Medication Instructions:  Your Physician recommend you continue on your current medication as directed.    *If you need a refill on your cardiac medications before your next appointment, please call your pharmacy*   Lab Work: Your provider would like for you to have following labs drawn today BMET and Lipid panel.   If you have labs (blood work) drawn today and your tests are completely normal, you will receive your results only by: MyChart Message (if you have MyChart) OR A paper copy in the mail If you have any lab test that is abnormal or we need to change your treatment, we will call you to review the results.   Testing/Procedures: Your physician has requested that you have an LIMITED echocardiogram. Echocardiography is a painless test that uses sound waves to create images of your heart. It provides your doctor with information about the size and shape of your heart and how well your heart's chambers and valves are working.   You may receive an ultrasound enhancing agent through an IV if needed to better visualize your heart during the echo. This procedure takes approximately one hour.  There are no restrictions for this procedure.  This will take place at [address] [facility]) [address], [place] [address]  Please note: We ask at that you not bring children with you during ultrasound (echo/ vascular) testing. Due to room size and safety concerns, children are not allowed in the ultrasound rooms during exams. Our front office staff cannot provide observation of children in our lobby area while testing is being conducted. An adult accompanying a patient to their appointment will only be allowed in the ultrasound room at the discretion of the ultrasound technician under special circumstances. We apologize for any inconvenience.   Follow-Up: At [facility], you and your health needs are our priority.  As part of our continuing mission to provide you with  exceptional heart care, we have created designated Provider Care Teams.  These Care Teams include your primary Cardiologist (physician) and Advanced Practice Providers (APPs -  Physician Assistants and Nurse Practitioners) who all work together to provide you with the care you need, when you need it.  We recommend signing up for the patient portal called "MyChart".  Sign up information is provided on this After Visit Summary.  MyChart is used to connect with patients for Virtual Visits (Telemedicine).  Patients are able to view lab/test results, encounter notes, upcoming appointments, etc.  Non-urgent messages can be sent to your provider as well.   To learn more about what you can do with MyChart, go to [email or web address].    Your next appointment:   5-6  month(s)  Provider:   You may see Julien Nordmann, MD

## 2023-08-11 NOTE — Progress Notes (Signed)
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  Angiography in January 2020 showed no significant aortoiliac disease.  On the right there was moderate to severely calcified SFA with diffuse disease in the midsegment and three-vessel runoff below the knee.  On the left there was borderline significant SFA disease which was calcified with three-vessel runoff below the knee.  Currently being seen by Dr. Kirke Corin for PAD.  In October 2020 he was admitted with COVID, echo showed new LV dysfunction with an EF of 35-40%.  Follow-up echo in early 21 showed improvement to 50-55%.  He had low risk stress test in October 2021 which was performed prior to back surgery.  In August 2022 he he complained of chest tightness, echocardiogram showed an EF of 50-55% with normal RV function and RVSP of 44.7.  He subsequently underwent stress testing in September 2022 in the setting of admission for unstable angina and dyspnea and this was low risk without evidence of ischemia.  Echocardiogram ordered in April 2024 for shortness of breath and bronchitis that showed newly reduced EF of 40-45% and grade 1 diastolic dysfunction.  Further evaluation and Myoview Eugenie Birks was ordered and completed on 12/27/2022 which was high risk showing moderate in size, severe, partially reversible basal and mid inferior, inferior  lateral defect consistent with ischemia and an element of scar.  EF was 25-35%.  He underwent cardiac cath on 01/13/2023.  This showed left dominant coronary arteries with severe three-vessel CAD.  Patent grafts including LIMA to LAD, SVG to first and second diagonals, patent mid left circumflex stent.  He was noted to have subacute total occlusion of the mid to distal left circumflex with collaterals from LAD.  Right heart cath showed mildly elevated RA pressure, moderate pulmonary hypertension, moderately to severely elevated wedge pressures with normal cardiac output.  He was treated with DES to mid/distal left circumflex.  He was started on DAPT with ASA and Plavix for at least 6 months.      History of Present Illness:  [name] is a 82 y.o. male who returns for 64-month follow-up for coronary disease and ischemic cardiomyopathy.  Today patient arrives to clinic alone.  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  He notes he does not get much exercise at home but he on a farm that keeps him busy daily and generally on his feet and walking often.  He states he has had some minor bruising to his arms since the addition of Plavix.  He notes that he has had some generalized fatigue since his COVID admission in 2020 however this has gradually improved.  He denies any current chest pain, exertional angina.  He does have some baseline shortness of breath and DOE that  is chronic for him that he reports has not worsened.  Shortness of breath has been stable since he had COVID in 2020 he also has history of COPD and prior smoking history.  He does note that he has been weighing himself daily and has not had to take his extra dose of Lasix on top of his daily dose for any weight gain.  He is currently without any leg swelling but she does take Lasix daily for.  He does note that  he has had some headaches on and off for the past 2 weeks that he plans to talk to his PCP about.  He denies any falls or head injury.  No orthopnea or PND.        Review of Systems  Constitutional: Negative for weight gain and weight loss.  Cardiovascular:  Positive for dyspnea on exertion. Negative for chest pain, claudication, irregular heartbeat, leg swelling, near-syncope, orthopnea, palpitations, paroxysmal nocturnal dyspnea and syncope.  Respiratory:  Positive for shortness of breath. Negative for cough and hemoptysis.   Gastrointestinal:  Negative for abdominal pain, hematochezia and melena.  Genitourinary:  Negative for hematuria.  Neurological:  Positive for headaches. Negative for dizziness and light-headedness.     See HPI    Studies Reviewed:   EKG Interpretation Date/Time:  Friday August 11 2023 09:59:03 EST Ventricular Rate:  82 PR Interval:  162 QRS Duration:  126 QT Interval:  384 QTC Calculation: 448 R Axis:   -60  Text Interpretation: Normal sinus rhythm Left axis deviation Minimal voltage criteria for LVH, may be normal variant ( Cornell product ) Confirmed by Rise Paganini [phone]) on 08/11/2023 1:33:03 PM    Zio 01/27/2023 Normal sinus rhythm Patient had a min HR of 49 bpm, max HR of 152 bpm, and avg HR of 75 bpm.  2 Supraventricular Tachycardia runs occurred, the run with the fastest interval lasting 15 beats with a max rate of 152 bpm (avg 135 bpm); the run with the fastest interval was also the longest.  Not patient triggered  Cardiac catheterization 01/13/2023   Ost LM to LM lesion is 50% stenosed.   Ost LAD to Prox LAD lesion is 100% stenosed.   Mid LAD lesion is 60% stenosed.   2nd Diag lesion is 50% stenosed.   Prox RCA-1 lesion is 99% stenosed.   Prox RCA-2 lesion is 90% stenosed.   Prox Cx to Mid Cx lesion is 5% stenosed.   Ost Cx to Prox Cx lesion is 40% stenosed.   Mid Cx to Dist Cx lesion is 99% stenosed.   Dist Cx lesion is 50%  stenosed.   Dist LAD lesion is 40% stenosed.   A drug-eluting stent was successfully placed using a STENT ONYX FRONTIER [id number].   Post intervention, there is a 10% residual stenosis.   LIMA and is moderate in size.   and is anatomically normal.   The graft exhibits no disease.   There is moderate left ventricular systolic dysfunction.   LV end diastolic pressure is moderately elevated.   The left ventricular ejection fraction is 35-45% by visual estimate.   1.  Left dominant coronary arteries with severe three-vessel coronary artery disease.  Patent grafts including LIMA to LAD and SVG to first and second diagonals.  Patent mid left circumflex stent.  However, there is subtotal occlusion of the mid to distal left circumflex with collaterals from the LAD. 2.  Moderately reduced LV systolic function. 3.  Right heart catheterization showed mildly elevated RA  pressure, moderate pulmonary hypertension, moderately to severely elevated wedge pressure and normal cardiac output. 4.  Difficult but successful angioplasty and drug-eluting stent placement to the mid/distal left circumflex.  The procedure was difficult due to calcifications and subtotal occlusion with difficulty crossing. Intervention    Lexiscan MPI 12/27/2022   Abnormal pharmacologic myocardial perfusion stress test.   There is a moderate in size, severe, partially reversible basal and mid inferior/inferolateral defect consistent with ischemia and an element of scar.   Left ventricular systolic function is severely reduced (LVEF 25-35%).   Post-CABG findings, coronary artery calcification, and aortic atherosclerosis are noted on the attenuation correction CT.   1.4 cm hypodensity is noted in the liver, similar to the prior abdominal CT from 10/04/2021.   Compared with the prior study from 04/22/2021, inferior/inferolateral defect and reduced LVEF are new.   This is a high-risk study.  Echocardiogram 11/22/2022 1. Left ventricular ejection  fraction, by estimation, is 40 to 45%. Left  ventricular ejection fraction by 3D volume is 42 %. The left ventricle has  mildly decreased function. The left ventricle demonstrates global  hypokinesis. Left ventricular diastolic   parameters are consistent with Grade I diastolic dysfunction (impaired  relaxation). The average left ventricular global longitudinal strain is  -10.2 %.   2. Right ventricular systolic function is normal. The right ventricular  size is normal. There is mildly elevated pulmonary artery systolic  pressure. The estimated right ventricular systolic pressure is 39.8 mmHg.   3. Left atrial size was mildly dilated.   4. The mitral valve is normal in structure. Mild mitral valve  regurgitation. No evidence of mitral stenosis.   5. The aortic valve is normal in structure. Aortic valve regurgitation is  not visualized. Mild aortic valve stenosis. Aortic valve mean gradient  measures 12.0 mmHg.   6. The inferior vena cava is normal in size with greater than 50%  respiratory variability, suggesting right atrial pressure of 3 mmHg.  Risk Assessment/Calculations:            Physical Exam:   VS:  BP 138/72 (BP Location: Left Arm, Patient Position: Sitting)   Pulse 82   Ht 6' (1.829 m)   Wt 202 lb 9.6 oz (91.9 kg)   SpO2 98%   BMI 27.48 kg/m    Wt Readings from Last 3 Encounters:  08/11/23 202 lb 9.6 oz (91.9 kg)  08/02/23 208 lb 8 oz (94.6 kg)  07/11/23 207 lb 12.8 oz (94.3 kg)    Constitutional:      Appearance: Normal and healthy appearance.  Neck:     Vascular: JVD normal.  Pulmonary:     Effort: Pulmonary effort is normal.     Breath sounds: Normal breath sounds.  Chest:     Chest wall: Not tender to palpatation.  Cardiovascular:     PMI at left midclavicular line. Normal rate. Regular rhythm. Normal S1. Normal S2.      Murmurs: There is no murmur.     No gallop.  No click. No rub.  Pulses:    Intact distal pulses.  Edema:    Peripheral edema absent.   Musculoskeletal: Normal range of motion.     Cervical back: Normal range of motion and neck supple. Skin:    General: Skin is warm and dry.  Neurological:     General: No focal deficit present.     Mental Status: Alert and oriented to person, place and time.  Psychiatric:  Behavior: Behavior is cooperative.        Assessment and Plan:  Ischemic cardiomyopathy / HFmrEF -Echocardiogram 11/22/2022 in the setting of bronchitis/SOB with LVEF 40-45% and subsequent Lexiscan stress test on 12/27/2022 showing high risk study.  Heart catheterization on 01/13/2023 showing subtotal occlusion of the mid to distal left circumflex with collaterals from LAD.  DES placed to mid/distal left circumflex. -Euvolemic and well compensated.  NYHA class II.  He is currently without exertional angina or worsening of his baseline DOE.  He has no leg swelling, does require loop diuretic.  Given it has been 6 months since his stent placement will plan for limited echocardiogram today to assess his LV function.  -His GDMT with previously limited in the setting of renal dysfunction however most recent GFR was 52 and creatinine unremarkable on 02/2023.  If his EF has not recovered can plan to switch his ARB to [facility] if needed.  Repeat BMP today -Continue his current GDMT carvedilol 3.125 mg twice daily, losartan 25 mg once daily, furosemide 20 mg once daily  Coronary artery disease -S/p remote two-vessel CABG in 1998 with subsequent PCI/BMS to left circumflex in 2002.  Now with DES placed to mid/distal left circumflex in 12/2022.  He is currently stable with no anginal symptoms, no indication for ischemic evaluation at this time. -I will have him remain on aspirin 81 mg and Plavix 75 mg daily for 1 year.  His DAPT will need to be addressed on his follow-up visit. -Continue GDMT aspirin 81mg , Plavix 75mg , carvedilol 3.125mg  BID, ezetimibe 10mg , atorvastatin 80mg , isosorbide 30mg , losartan 25mg , ranexa 500mg , prn  nitroglycerin   Hyperlipidemia -His most recent lipid panel shows LDL cholesterol of 59.  Currently under excellent control.  Will order repeat fasting lipid panel today.  Encouraged heart healthy dieting including increasing fiber, vegetables, fruit and diet.  Focus on decreasing foods high in sugar and processed foods.  Peripheral arterial disease -Currently follows with Dr. Kirke Corin.  Lower arterial duplex shows bilateral SFA disease.  He is currently without claudication and medical management was recommended.  Continue aspirin and Plavix.  Bilateral carotid artery stenosis -US carotid bilateral performed on 10/12/2022 showing mild to moderate bilateral carotid bifurcation plaque with Doppler measurements indicated less than 50% stenosis.  Recommend routine monitoring.  He is without dizziness or syncope.  Hypertension -BP today 138/72.  Under good control.  Recommend continuing current antihypertensive therapy  COPD/emphysema -Currently following with pulmonology.  He notes stable SOB/DOE.  Sinus headache -He notes intermittent sinus headache the last 2 weeks.  He is currently following with PCP regarding this.               Dispo:  Return in about 5 months (around 01/09/2024).  Signed, Denyce Robert, NP

## 2023-08-12 LAB — LIPID PANEL
Chol/HDL Ratio: 3.9 {ratio} (ref 0.0–5.0)
Cholesterol, Total: 161 mg/dL (ref 100–199)
HDL: 41 mg/dL (ref >39)
LDL Chol Calc (NIH): 96 mg/dL (ref 0–99)
Triglycerides: 132 mg/dL (ref 0–149)
VLDL Cholesterol Cal: 24 mg/dL (ref 5–40)

## 2023-08-12 LAB — BASIC METABOLIC PANEL
BUN/Creatinine Ratio: 19 (ref 10–24)
BUN: 27 mg/dL (ref 8–27)
CO2: 23 mmol/L (ref 20–29)
Calcium: 9.4 mg/dL (ref 8.6–10.2)
Chloride: 102 mmol/L (ref 96–106)
Creatinine, Ser: 1.42 mg/dL — ABNORMAL HIGH (ref 0.76–1.27)
Glucose: 123 mg/dL — ABNORMAL HIGH (ref 70–99)
Potassium: 4.5 mmol/L (ref 3.5–5.2)
Sodium: 141 mmol/L (ref 134–144)
eGFR: 49 mL/min/{1.73_m2} — ABNORMAL LOW (ref >59)

## 2023-08-14 ENCOUNTER — Other Ambulatory Visit: Payer: Self-pay | Admitting: Unknown Physician Specialty

## 2023-08-14 ENCOUNTER — Ambulatory Visit
Admission: RE | Admit: 2023-08-14 | Discharge: 2023-08-14 | Disposition: A | Discharge status: Home / Self Care | Payer: HMO | Source: Ambulatory Visit | Attending: Unknown Physician Specialty | Admitting: Unknown Physician Specialty

## 2023-08-14 DIAGNOSIS — J019 Acute sinusitis, unspecified: Secondary | ICD-10-CM

## 2023-08-14 DIAGNOSIS — R519 Headache, unspecified: Secondary | ICD-10-CM | POA: Diagnosis not present

## 2023-08-24 NOTE — Telephone Encounter (Signed)
 This is resolved. Patient is starting Dupixent on Tuesday, 08/29/2023

## 2023-08-29 ENCOUNTER — Ambulatory Visit (INDEPENDENT_AMBULATORY_CARE_PROVIDER_SITE_OTHER): Payer: HMO | Admitting: Pharmacist

## 2023-08-29 DIAGNOSIS — J4489 Other specified chronic obstructive pulmonary disease: Secondary | ICD-10-CM

## 2023-08-29 DIAGNOSIS — J4540 Moderate persistent asthma, uncomplicated: Secondary | ICD-10-CM

## 2023-08-29 MED ORDER — DUPIXENT 300 MG/2ML ~~LOC~~ SOAJ: 300.0000 mg | SUBCUTANEOUS | 1 refills | Status: DC

## 2023-08-29 NOTE — Patient Instructions (Signed)
 Your next Dupixent  dose is due on 09/12/2023, 09/26/2023, and every 14 days thereafter.  CONTINUE Trelegy 1 puff daily and Ventolin  2 puffs every 6 hours as needed  Your prescription will be shipped from [facility]. Their phone number is [phone]. Please call to schedule shipment and confirm address. They will mail your medication to your home.  Your copay should be affordable.   You will need to be seen by your provider in 3 to 4 months to assess how Dupixent  is working for you. Call our clinic if you need to make this appointment.  Stay up to date on all routine vaccines: influenza, pneumonia, COVID19, Shingles  How to manage an injection site reaction: Remember the 5 C's: COUNTER - leave on the counter at least 30 minutes but up to overnight to bring medication to room temperature. This may help prevent stinging COLD - place something cold (like an ice gel pack or cold water  bottle) on the injection site just before cleansing with alcohol. This may help reduce pain CLARITIN - use Claritin (generic name is loratadine) for the first two weeks of treatment or the day of, the day before, and the day after injecting. This will help to minimize injection site reactions CORTISONE CREAM - apply if injection site is irritated and itching CALL ME - if injection site reaction is bigger than the size of your fist, looks infected, blisters, or if you develop hives

## 2023-08-29 NOTE — Addendum Note (Signed)
 Addended by: Murrell Redden on: 08/29/2023 03:36 PM   Modules accepted: Orders

## 2023-08-29 NOTE — Progress Notes (Signed)
 HPI Patient presents today to [facility] to see pharmacy team for Dupixent  new start.  Past medical history includes asthma, COPD, HTN, peripheral artery disease, HFpEF, CAD, OSA, mild pulmonary HTN, GERD, T2DM, HLD, BPH, CKD (stage 3), and bladder cancer.   He arrives today with his wife, [name].   Respiratory Medications Current regimen:  -Trelegy 1 puff qd -Ventolin  2 puffs q6h prn  Patient reports no known adherence challenges  OBJECTIVE Allergies  Allergen Reactions   Vioxx [Rofecoxib] Other (See Comments)    Hemorrhage    Spiriva  Respimat [Tiotropium Bromide Monohydrate ] Other (See Comments)    Elevated bp   Contrast Media [Iodinated Contrast Media] Itching and Rash    Delayed reaction post abdominal aortagram.    Morphine  Nausea Only and Other (See Comments)    Irritability     Outpatient Encounter Medications as of 08/29/2023  Medication Sig   albuterol  (PROVENTIL ) (2.5 MG/3ML) 0.083% nebulizer solution Take 3 mLs (2.5 mg total) by nebulization every 6 (six) hours as needed for wheezing or shortness of breath.   albuterol  (VENTOLIN  HFA) 108 (90 Base) MCG/ACT inhaler TAKE 2 PUFFS INTO LUNGS EVERY 6 HOURS ASNEEDED FOR WHEEZING OR SHORTNESS OF BREATH   aspirin  EC 81 MG EC tablet Take 1 tablet (81 mg total) by mouth daily.   atorvastatin  (LIPITOR) 20 MG tablet TAKE 1 TABLET BY MOUTH DAILY   carvedilol  (COREG ) 3.125 MG tablet Take 1 tablet (3.125 mg total) by mouth 2 (two) times daily.   Cholecalciferol (VITAMIN D3) 25 MCG (1000 UT) CAPS Take 1 capsule (1,000 Units total) by mouth 2 (two) times a week.   clopidogrel  (PLAVIX ) 75 MG tablet Take 1 tablet (75 mg total) by mouth daily with breakfast.   cyclobenzaprine  (FLEXERIL ) 5 MG tablet Take 1 tablet (5 mg total) by mouth 3 (three) times daily as needed for muscle spasms.   docusate sodium  (COLACE) 100 MG capsule Take 1 capsule (100 mg total) by mouth daily as needed for mild constipation.   Dupilumab  (DUPIXENT ) 300  MG/2ML SOAJ Inject 300 mg into the skin every 14 (fourteen) days. Inject 600mg  at Day 0 then 300mg  every 14 days thereafter (Patient not taking: Reported on 08/11/2023)   ezetimibe  (ZETIA ) 10 MG tablet Take 1 tablet (10 mg total) by mouth daily.   famotidine  (PEPCID ) 20 MG tablet Take 20 mg by mouth daily. (Patient not taking: Reported on 08/11/2023)   fluticasone  (FLONASE ) 50 MCG/ACT nasal spray Place 1 spray into both nostrils daily as needed for allergies or rhinitis.   Fluticasone -Umeclidin-Vilant (TRELEGY ELLIPTA ) 200-62.5-25 MCG/ACT AEPB Inhale 1 puff into the lungs daily.   furosemide  (LASIX ) 40 MG tablet Take 0.5 tablets (20 mg total) by mouth daily. Take 1 tablet (40 mg) daily as need for weight greater then 208.   gabapentin  (NEURONTIN ) 600 MG tablet Take 1 tablet (600 mg total) by mouth 3 (three) times daily.   HYDROcodone -acetaminophen  (NORCO/VICODIN) 5-325 MG tablet Take 1-2 tablets by mouth every 4 (four) hours as needed for moderate pain.   hydrocortisone  2.5 % cream Apply topically 2 (two) times daily as needed (Rash).   isosorbide  mononitrate (IMDUR ) 30 MG 24 hr tablet Take 1 tablet (30 mg total) by mouth daily.   losartan  (COZAAR ) 25 MG tablet TAKE 1 TABLET BY MOUTH DAILY   montelukast  (SINGULAIR ) 10 MG tablet TAKE 1 TABLET BY MOUTH DAILY   mupirocin  ointment (BACTROBAN ) 2 % Apply 1 Application topically 2 (two) times daily.   nitroGLYCERIN  (NITROSTAT ) 0.4 MG SL  tablet Place 1 tablet (0.4 mg total) under the tongue every 5 (five) minutes as needed for chest pain.   polyethylene glycol (MIRALAX  / GLYCOLAX ) 17 g packet Take 17 g by mouth daily as needed for mild constipation.    ranolazine  (RANEXA ) 500 MG 12 hr tablet TAKE ONE (1) TABLET BY MOUTH TWO TIMES PER DAY   tamsulosin  (FLOMAX ) 0.4 MG CAPS capsule    No facility-administered encounter medications on file as of 08/29/2023.     Immunization History  Administered Date(s) Administered   Fluad Quad(high Dose 65+) 04/17/2019,  04/15/2022, 05/08/2023   Influenza Whole 08/15/2000, 07/04/2007, 05/09/2008, 05/31/2010   Influenza, High Dose Seasonal PF 05/15/2013, 06/25/2015, 07/05/2021   Influenza,inj,Quad PF,6+ Mos 04/17/2014, 05/18/2016, 06/07/2018   Influenza-Unspecified 05/22/2017, 05/28/2020   PFIZER(Purple Top)SARS-COV-2 Vaccination 09/07/2019, 09/28/2019, 05/12/2020, 01/19/2021   Pneumococcal Conjugate-13 02/09/2015   Pneumococcal Polysaccharide-23 08/15/2000, 10/29/2008   Respiratory Syncytial Virus Vaccine,Recomb Aduvanted(Arexvy) 10/10/2022   Td 08/15/2005   Tdap 02/10/2018   Zoster Recombinant(Shingrix) 04/05/2017, 07/25/2017   Zoster, Live 02/04/2009     PFTs     No data to display           Eosinophils Most recent blood eosinophil count was 0.2 cells/microL taken on 10/04/2022.   IgE: 70 on 10/04/2022   Assessment   Biologics training for dupilumab  (Dupixent )  Goals of therapy: Mechanism: human monoclonal IgG4 antibody that inhibits interleukin-4 and interleukin-13 cytokine-induced responses, including release of proinflammatory cytokines, chemokines, and IgE Reviewed that Dupixent  is add-on medication and patient must continue maintenance inhaler regimen. Response to therapy: may take 4 months to determine efficacy. Discussed that patients generally feel improvement sooner than 4 months.  Side effects: injection site reaction (6-18%), antibody development (5-16%), ophthalmic conjunctivitis (2-16%), transient blood eosinophilia (1-2%)  Dose: 600mg  at Week 0 (administered today in clinic) followed by 300mg  every 14 days thereafter  Administration/Storage:  Reviewed administration sites of thigh or abdomen (at least 2-3 inches away from abdomen). Reviewed the upper arm is only appropriate if caregiver is administering injection  Do not shake pen/syringe as this could lead to product foaming or precipitation. Do not use if solution is discolored or contains particulate matter or if window  on prefilled pen is yellow (indicates pen has been used).  Reviewed storage of medication in refrigerator. Reviewed that Dupixent  can be stored at room temperature in unopened carton for up to 14 days.  Access: Approval of Dupixent  through: insurance and patient assistance  Patient self-administered Dupixent  300mg /15ml x 2 (total dose 600mg ) in right lower abdomen and left lower abdomen using sample  Dupixent  300mg /56mL autoinjector pen NDC: [phone] Lot: [id number] Expiration: 01/12/2025  Patient monitored for 30 minutes for adverse reaction.  Patient tolerated Dupixent .  Injection site noted. Patient denies itchiness and irritation at injection., No swelling or redness noted., and Reviewed injection site reaction management with patient verbally and printed information for review in AVS  Medication Reconciliation  A drug regimen assessment was performed, including review of allergies, interactions, disease-state management, dosing and immunization history. Medications were reviewed with the patient, including name, instructions, indication, goals of therapy, potential side effects, importance of adherence, and safe use.  Drug interaction(s): No significant DDI identified  Immunizations  Patient is indicated for the influenzae, pneumonia, and shingles vaccinations. Patient has received COVID19 vaccines.   PLAN Continue Dupixent  300 mg every 14 days.  Next dose is due 09/12/2023 and every 14 days thereafter. Rx sent to: [facility]: [phone].  Patient provided with pharmacy phone number  and advised to call later this week to schedule shipment to home.  Continue maintenance asthma regimen of:  Trelegy 1 puff qd Ventolin  2 puffs q6h prn  All questions encouraged and answered.  Instructed patient to reach out with any further questions or concerns.  Thank you for allowing pharmacy to participate in this patient's care.   Tolu Shadeed Colberg, PharmD [facility] PGY-1

## 2023-08-31 DIAGNOSIS — H9202 Otalgia, left ear: Secondary | ICD-10-CM | POA: Diagnosis not present

## 2023-09-05 ENCOUNTER — Telehealth: Payer: Self-pay | Admitting: Pulmonary Disease

## 2023-09-05 ENCOUNTER — Other Ambulatory Visit: Payer: Self-pay

## 2023-09-05 ENCOUNTER — Ambulatory Visit: Payer: HMO | Attending: Emergency Medicine

## 2023-09-05 DIAGNOSIS — I255 Ischemic cardiomyopathy: Secondary | ICD-10-CM

## 2023-09-05 LAB — ECHOCARDIOGRAM LIMITED
AV Mean grad: 8 mm[Hg]
AV Peak grad: 15.5 mm[Hg]
Ao pk vel: 1.97 m/s
Area-P 1/2: 3.27 cm2
S' Lateral: 4.5 cm

## 2023-09-05 MED ORDER — TRELEGY ELLIPTA 200-62.5-25 MCG/ACT IN AEPB
1.0000 | INHALATION_SPRAY | Freq: Every day | RESPIRATORY_TRACT | 3 refills | Status: DC
  Filled 2023-09-05: qty 180, 90d supply, fill #0
  Filled 2023-12-14: qty 180, 90d supply, fill #1
  Filled 2024-03-13: qty 180, 90d supply, fill #2
  Filled 2024-06-12: qty 180, 90d supply, fill #3

## 2023-09-05 NOTE — Telephone Encounter (Signed)
Pt is asking for Trelegy rx 90 day supply to be sent to [facility] pharmacy

## 2023-09-05 NOTE — Telephone Encounter (Signed)
Prescription sent to [facility] pharmacy. Nothing further needed.

## 2023-09-08 ENCOUNTER — Encounter: Payer: Self-pay | Admitting: Family Medicine

## 2023-09-08 ENCOUNTER — Ambulatory Visit (INDEPENDENT_AMBULATORY_CARE_PROVIDER_SITE_OTHER): Payer: HMO | Admitting: Family Medicine

## 2023-09-08 ENCOUNTER — Ambulatory Visit
Admission: RE | Admit: 2023-09-08 | Discharge: 2023-09-08 | Disposition: A | Discharge status: Home / Self Care | Payer: HMO | Source: Ambulatory Visit | Attending: Family Medicine | Admitting: Family Medicine

## 2023-09-08 ENCOUNTER — Other Ambulatory Visit: Payer: Self-pay | Admitting: Emergency Medicine

## 2023-09-08 ENCOUNTER — Other Ambulatory Visit: Payer: Self-pay

## 2023-09-08 VITALS — BP 142/62 | HR 76 | Temp 98.1°F | Ht 72.0 in | Wt 211.2 lb

## 2023-09-08 DIAGNOSIS — J4489 Other specified chronic obstructive pulmonary disease: Secondary | ICD-10-CM

## 2023-09-08 DIAGNOSIS — M25511 Pain in right shoulder: Secondary | ICD-10-CM | POA: Diagnosis not present

## 2023-09-08 DIAGNOSIS — M19011 Primary osteoarthritis, right shoulder: Secondary | ICD-10-CM | POA: Diagnosis not present

## 2023-09-08 DIAGNOSIS — I10 Essential (primary) hypertension: Secondary | ICD-10-CM

## 2023-09-08 DIAGNOSIS — M75101 Unspecified rotator cuff tear or rupture of right shoulder, not specified as traumatic: Secondary | ICD-10-CM | POA: Insufficient documentation

## 2023-09-08 DIAGNOSIS — S4991XA Unspecified injury of right shoulder and upper arm, initial encounter: Secondary | ICD-10-CM

## 2023-09-08 DIAGNOSIS — I5032 Chronic diastolic (congestive) heart failure: Secondary | ICD-10-CM

## 2023-09-08 DIAGNOSIS — G8929 Other chronic pain: Secondary | ICD-10-CM | POA: Diagnosis not present

## 2023-09-08 DIAGNOSIS — E1169 Type 2 diabetes mellitus with other specified complication: Secondary | ICD-10-CM

## 2023-09-08 DIAGNOSIS — M79621 Pain in right upper arm: Secondary | ICD-10-CM | POA: Diagnosis not present

## 2023-09-08 DIAGNOSIS — U071 COVID-19: Secondary | ICD-10-CM

## 2023-09-08 LAB — POCT GLYCOSYLATED HEMOGLOBIN (HGB A1C): Hemoglobin A1C: 6.1 % — AB (ref 4.0–5.6)

## 2023-09-08 MED ORDER — HYDROCODONE-ACETAMINOPHEN 5-325 MG PO TABS: 1.0000 | ORAL_TABLET | Freq: Three times a day (TID) | ORAL | 0 refills | Status: DC | PRN

## 2023-09-08 MED ORDER — SACUBITRIL-VALSARTAN 24-26 MG PO TABS
1.0000 | ORAL_TABLET | Freq: Two times a day (BID) | ORAL | 3 refills | Status: DC
Start: 2023-09-08 — End: 2023-10-06
  Filled 2023-09-08: qty 180, 90d supply, fill #0

## 2023-09-08 NOTE — Telephone Encounter (Signed)
Spoke with patient.  Saw ortho - rec 4-6 wk f/u with Dr Rennis Chris.  States tramadol doesn't help. Requests stronger pain medicine - Rx hydrocodone 5/325mg  1 TID PRN #[id number] for acute pain.   [place] CSRS reviewed.

## 2023-09-08 NOTE — Assessment & Plan Note (Signed)
Chronic overall stable. BP elevated however he's in pain after recent shoulder injury - no med changes indicated.

## 2023-09-08 NOTE — Assessment & Plan Note (Signed)
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

## 2023-09-08 NOTE — Assessment & Plan Note (Addendum)
Chronic, diet controlled, stable. Continue following diabetic diet.

## 2023-09-08 NOTE — Assessment & Plan Note (Addendum)
Anticipate RTC injury however also has point tenderness to proximal humerus.  Check shoulder xrays to evaluate for humeral fracture - no obvious fracture noted, he does have h/o AC joint arthropathy.  Will recommend urgent eval at [facility] - he will go there today for further evlauation.

## 2023-09-08 NOTE — Patient Instructions (Addendum)
Right shoulder xray today  Go to [facility] urgent care evaluation for R shoulder injury - let me know if any trouble making appointment.  Sugars staying ok - continue limiting sugar and carbs in the diet to keep diabetes under control.  Continue current medicines.  Return in 4 months for physical/wellness visit

## 2023-09-08 NOTE — Progress Notes (Signed)
Ph: [phone] Fax: [phone]   Patient ID: [name], male    DOB: 02-04-1941, 83 y.o.   MRN: [id number]  This visit was conducted in person.  BP (!) 142/62 (BP Location: Right Arm, Cuff Size: Large)   Pulse 76   Temp 98.1 F (36.7 C) (Oral)   Ht 6' (1.829 m)   Wt 211 lb 4 oz (95.8 kg)   SpO2 98%   BMI 28.65 kg/m   BP Readings from Last 3 Encounters:  09/08/23 (!) 142/62  08/11/23 138/72  08/02/23 136/78   CC: 6 mo f/u visit, recent fall  Subjective:   HPI: [name] is a 83 y.o. male presenting on 09/08/2023 for Medical Management of Chronic Issues (Here for 6 mo f/u.) and Fall ([place]ell on 09[place]06/23 [place]. C/o R shoulder pain and has limited ROM. )   DOI: 09/06/2023 Fell in pasture tripped over his goat - injured R shoulder with resultant limited ROM. Tylenol hasn't helped. Worse with certain movements.   H/o chronic R shoulder pain s/p steroid injection by [facility] on 02/2023 and 07/2023, 2nd one with limited relief.   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  OSA on CPAP.  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   Saw cardiology earlier this month ,echo showed mildly reduced ER 40-45%, discussing Entresto in place of losartan. Pending Dr Mariah Milling eval. Patient is worried about cost of all these medications (dupixent, trelegy, now entresto).   Sees [facility] for known PAD.   HTN - Compliant with current antihypertensive regimen of carvedilol 3.125mg  bid, indur 30mg  daily, losartan 25mg  daily. BP elevated today - but he is in pain - see above. Does check blood pressures at home: well controlled at home.  No low blood pressure readings or symptoms of dizziness/syncope.  Denies HA, vision changes, CP/tightness, SOB, leg swelling.    DM - does regularly  check sugars - not recently. Compliant with antihyperglycemic regimen which includes: diet controlled. Denies low sugars or hypoglycemic symptoms. Denies paresthesias, blurry vision. Last diabetic eye exam 09/14/2022. Glucometer brand: GE. Last foot exam: 08/17/2022 - DUE. DSME: 10/2017. Lab Results  Component Value Date   HGBA1C 6.1 (A) 09/08/2023   Diabetic Foot Exam - Simple   Simple Foot Form Diabetic Foot exam was performed with the following findings: Yes 09/08/2023  9:45 AM  Visual Inspection No deformities, no ulcerations, no other skin breakdown bilaterally: Yes Sensation Testing Intact to touch and monofilament testing bilaterally: Yes Pulse Check Posterior Tibialis and Dorsalis pulse intact bilaterally: Yes Comments No claudication    Lab Results  Component Value Date   MICROALBUR <0.7 02/28/2023        Relevant past medical, surgical, family and social history reviewed and updated as indicated. Interim medical history since our last visit reviewed. Allergies and medications reviewed and updated. Outpatient Medications Prior to Visit  Medication Sig Dispense Refill   albuterol (PROVENTIL) (2.5 MG/3ML) 0.083% nebulizer solution Take 3 mLs (2.5 mg total) by nebulization every 6 (six) hours as needed for wheezing or shortness of breath. 75 mL 12   albuterol (VENTOLIN HFA) 108 (90 Base) MCG/ACT inhaler TAKE 2 PUFFS INTO LUNGS EVERY 6 HOURS ASNEEDED FOR WHEEZING OR SHORTNESS OF BREATH 8.5 g 6   aspirin EC 81 MG EC tablet Take 1 tablet (81 mg  total) by mouth daily. 30 tablet 0   atorvastatin (LIPITOR) 20 MG tablet TAKE 1 TABLET BY MOUTH DAILY 90 tablet 0   Cholecalciferol (VITAMIN D3) 25 MCG (1000 UT) CAPS Take 1 capsule (1,000 Units total) by mouth 2 (two) times a week.     clopidogrel (PLAVIX) 75 MG tablet Take 1 tablet (75 mg total) by mouth daily with breakfast. 90 tablet 3   cyclobenzaprine (FLEXERIL) 5 MG tablet Take 1 tablet (5 mg total) by mouth 3 (three) times daily as  needed for muscle spasms. 30 tablet 0   docusate sodium (COLACE) 100 MG capsule Take 1 capsule (100 mg total) by mouth daily as needed for mild constipation.     Dupilumab (DUPIXENT) 300 MG/2ML SOAJ Inject 300 mg into the skin every 14 (fourteen) days. **patient completed loading dose in clinic** 12 mL 1   ezetimibe (ZETIA) 10 MG tablet Take 1 tablet (10 mg total) by mouth daily. 90 tablet 3   famotidine (PEPCID) 20 MG tablet Take 20 mg by mouth daily.     fluticasone (FLONASE) 50 MCG/ACT nasal spray Place 1 spray into both nostrils daily as needed for allergies or rhinitis.     Fluticasone-Umeclidin-Vilant (TRELEGY ELLIPTA) 200-62.5-25 MCG/ACT AEPB Inhale 1 puff into the lungs daily. 180 each 3   furosemide (LASIX) 40 MG tablet Take 0.5 tablets (20 mg total) by mouth daily. Take 1 tablet (40 mg) daily as need for weight greater then 208. 180 tablet 3   gabapentin (NEURONTIN) 600 MG tablet Take 1 tablet (600 mg total) by mouth 3 (three) times daily. 270 tablet 4   HYDROcodone-acetaminophen (NORCO/VICODIN) 5-325 MG tablet Take 1-2 tablets by mouth every 4 (four) hours as needed for moderate pain. 30 tablet 0   hydrocortisone 2.5 % cream Apply topically 2 (two) times daily as needed (Rash). 30 g 5   isosorbide mononitrate (IMDUR) 30 MG 24 hr tablet Take 1 tablet (30 mg total) by mouth daily. 90 tablet 3   losartan (COZAAR) 25 MG tablet TAKE 1 TABLET BY MOUTH DAILY 90 tablet 0   montelukast (SINGULAIR) 10 MG tablet TAKE 1 TABLET BY MOUTH DAILY 30 tablet 11   mupirocin ointment (BACTROBAN) 2 % Apply 1 Application topically 2 (two) times daily. 22 g 0   nitroGLYCERIN (NITROSTAT) 0.4 MG SL tablet Place 1 tablet (0.4 mg total) under the tongue every 5 (five) minutes as needed for chest pain. 25 tablet 3   polyethylene glycol (MIRALAX / GLYCOLAX) 17 g packet Take 17 g by mouth daily as needed for mild constipation.      ranolazine (RANEXA) 500 MG 12 hr tablet TAKE ONE (1) TABLET BY MOUTH TWO TIMES PER DAY  180 tablet 3   tamsulosin (FLOMAX) 0.4 MG CAPS capsule      carvedilol (COREG) 3.125 MG tablet Take 1 tablet (3.125 mg total) by mouth 2 (two) times daily. 180 tablet 3   No facility-administered medications prior to visit.     Per HPI unless specifically indicated in ROS section below Review of Systems  Objective:  BP (!) 142/62 (BP Location: Right Arm, Cuff Size: Large)   Pulse 76   Temp 98.1 F (36.7 C) (Oral)   Ht 6' (1.829 m)   Wt 211 lb 4 oz (95.8 kg)   SpO2 98%   BMI 28.65 kg/m   Wt Readings from Last 3 Encounters:  09/08/23 211 lb 4 oz (95.8 kg)  08/11/23 202 lb 9.6 oz (91.9 kg)  08/02/23 208 lb  8 oz (94.6 kg)      Physical Exam Vitals and nursing note reviewed.  Constitutional:      Appearance: Normal appearance. He is not ill-appearing.  Eyes:     Extraocular Movements: Extraocular movements intact.     Conjunctiva/sclera: Conjunctivae normal.     Pupils: Pupils are equal, round, and reactive to light.  Cardiovascular:     Rate and Rhythm: Normal rate and regular rhythm.     Pulses: Normal pulses.     Heart sounds: Normal heart sounds. No murmur heard. Pulmonary:     Effort: Pulmonary effort is normal. No respiratory distress.     Breath sounds: Normal breath sounds. No wheezing, rhonchi or rales.  Musculoskeletal:        General: Tenderness and signs of injury present.     Right lower leg: No edema.     Left lower leg: No edema.     Comments:  See HPI for foot exam if done L shoulder WNL R shoulder exam: No deformity of shoulders on inspection. Point tender to palpation anterior upper R humerus as well as at R posterior shoulder bursa  Limited ROM past 90 degrees due to pain ++ pain /weakness with testing SITS in ext rotation. No significant pain with rotation of humeral head in GH joint.   Skin:    General: Skin is warm and dry.     Findings: No rash.  Neurological:     Mental Status: He is alert.  Psychiatric:        Mood and Affect: Mood  normal.        Behavior: Behavior normal.       Results for orders placed or performed in visit on 09/08/23  POCT glycosylated hemoglobin (Hb A1C)   Collection Time: 09/08/23  9:27 AM  Result Value Ref Range   Hemoglobin A1C 6.1 (A) 4.0 - 5.6 %   HbA1c POC (<> result, manual entry)     HbA1c, POC (prediabetic range)     HbA1c, POC (controlled diabetic range)     *Note: Due to a large number of results and/or encounters for the requested time period, some results have not been displayed. A complete set of results can be found in Results Review.   Lab Results  Component Value Date   NA 141 08/11/2023   CL 102 08/11/2023   K 4.5 08/11/2023   CO2 23 08/11/2023   BUN 27 08/11/2023   CREATININE 1.42 (H) 08/11/2023   EGFR 49 (L) 08/11/2023   CALCIUM 9.4 08/11/2023   PHOS 3.7 02/28/2023   ALBUMIN 4.1 02/28/2023   GLUCOSE 123 (H) 08/11/2023    Assessment & Plan:   Problem List Items Addressed This Visit     Essential hypertension   Chronic overall stable. BP elevated however he's in pain after recent shoulder injury - no med changes indicated.       Type 2 diabetes mellitus with other specified complication (HCC)   Chronic, diet controlled, stable. Continue following diabetic diet.       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       Injury of right shoulder - Primary   Anticipate RTC injury however also has point tenderness to proximal humerus.  Check shoulder xrays to evaluate for humeral fracture - no obvious fracture noted, he  does have h/o AC joint arthropathy.  Will recommend urgent eval at [facility] - he will go there today for further evlauation.       Relevant Orders   DG Shoulder Right     No orders of the defined types were placed in this encounter.   Orders Placed This Encounter  Procedures   DG Shoulder Right    Standing Status:   Future    Number of Occurrences:   1    Expiration Date:   03/07/2024    Reason for Exam (SYMPTOM  OR DIAGNOSIS REQUIRED):   R shoulder and humerus pain after fall 2d ago    Preferred imaging location?:   [facility]   POCT glycosylated hemoglobin (Hb A1C)    Patient Instructions  Right shoulder xray today  Go to [facility] urgent care evaluation for R shoulder injury - let me know if any trouble making appointment.  Sugars staying ok - continue limiting sugar and carbs in the diet to keep diabetes under control.  Continue current medicines.  Return in 4 months for physical/wellness visit   Follow up plan: Return in about 4 months (around 01/06/2024) for medicare wellness visit, annual exam, prior fasting for blood work.  Eustaquio Boyden, MD

## 2023-09-08 NOTE — Assessment & Plan Note (Signed)
Sees cards, discussing Entresto in place of losartan.

## 2023-09-08 NOTE — Assessment & Plan Note (Signed)
Sees pulm, recently started Dupixent injections

## 2023-09-13 NOTE — Addendum Note (Signed)
Addended by: Murrell Redden on: 09/13/2023 02:21 PM   Modules accepted: Level of Service

## 2023-09-16 ENCOUNTER — Encounter: Payer: Self-pay | Admitting: Family Medicine

## 2023-09-18 ENCOUNTER — Other Ambulatory Visit: Payer: Self-pay | Admitting: Urology

## 2023-09-19 ENCOUNTER — Other Ambulatory Visit: Payer: Self-pay | Admitting: Cardiovascular Disease

## 2023-09-21 DIAGNOSIS — H04123 Dry eye syndrome of bilateral lacrimal glands: Secondary | ICD-10-CM | POA: Diagnosis not present

## 2023-09-26 DIAGNOSIS — M25511 Pain in right shoulder: Secondary | ICD-10-CM | POA: Diagnosis not present

## 2023-09-27 ENCOUNTER — Ambulatory Visit: Payer: HMO | Admitting: Urology

## 2023-09-27 VITALS — BP 121/66 | HR 87 | Ht 72.0 in | Wt 206.0 lb

## 2023-09-27 DIAGNOSIS — C679 Malignant neoplasm of bladder, unspecified: Secondary | ICD-10-CM | POA: Diagnosis not present

## 2023-09-27 DIAGNOSIS — Z8551 Personal history of malignant neoplasm of bladder: Secondary | ICD-10-CM | POA: Diagnosis not present

## 2023-09-27 LAB — URINALYSIS, COMPLETE
Bilirubin, UA: NEGATIVE
Glucose, UA: NEGATIVE
Ketones, UA: NEGATIVE
Leukocytes,UA: NEGATIVE
Nitrite, UA: NEGATIVE
Protein,UA: NEGATIVE
RBC, UA: NEGATIVE
Specific Gravity, UA: 1.015 (ref 1.005–1.030)
Urobilinogen, Ur: 1 mg/dL (ref 0.2–1.0)
pH, UA: 6 (ref 5.0–7.5)

## 2023-09-27 LAB — MICROSCOPIC EXAMINATION: Bacteria, UA: NONE SEEN

## 2023-09-27 NOTE — Progress Notes (Signed)
   09/27/23  CC:  Chief Complaint  Patient presents with   Cysto   Urologic history: 1.  Ta high-grade urothelial carcinoma bladder Incidentally noted on pelvic MRI ordered by orthopedics to have a 15 x 20 mm bladder mass near right UO; cystoscopy with 3 cm papillary tumor TURBT 10/19/2025 with high-grade tumor and a low-grade tumor left posterolateral wall Induction BCG x6 completed 01/20/2022 Maintenance BCG x 3: 05/23/2022 MR urogram 03/2022 showed no upper tract abnormalities TURBT 11/2022 for a recurrent papillary tumor on surveillance; path high-grade Ta urothelial carcinoma Reinduction BCG x 6 completed 02/15/2023 Maintenance BCG x 3 completed: 07/06/2023  2.  BPH with LUTS Cystoscopy moderate lateral lobe enlargement/bladder neck elevation On tamsulosin   HPI: No complaints today.  Denies dysuria, gross hematuria.  UA negative  See rooming tab for vitals   Cystoscopy Procedure Note  Patient identification was confirmed, informed consent was obtained, and patient was prepped using Betadine solution.  Lidocaine jelly was administered per urethral meatus.     Pre-Procedure: - Inspection reveals a normal caliber urethral meatus.  Procedure: The flexible cystoscope was introduced without difficulty - No urethral strictures/lesions are present. - Prostate moderate lateral lobe enlargement -  Moderate elevation  bladder neck - Bilateral ureteral orifices identified - Bladder mucosa  reveals no ulcers, tumors, or lesions - Site of last tumor location without abnormalities today - No bladder stones - Mild/moderate trabeculation  Retroflexion shows no abnormalities/tumor   Post-Procedure: - Patient tolerated the procedure well  Assessment/ Plan: No evidence recurrent tumor Will continue maintenance BCG and will be due for 3-week course late May 2025 Surveillance cystoscopy 3 months   Riki Altes, MD

## 2023-09-28 ENCOUNTER — Other Ambulatory Visit: Payer: Self-pay | Admitting: Cardiovascular Disease

## 2023-10-02 DIAGNOSIS — M25811 Other specified joint disorders, right shoulder: Secondary | ICD-10-CM | POA: Diagnosis not present

## 2023-10-03 ENCOUNTER — Encounter: Payer: Self-pay | Admitting: Family Medicine

## 2023-10-03 ENCOUNTER — Encounter: Payer: Self-pay | Admitting: Pulmonary Disease

## 2023-10-03 ENCOUNTER — Telehealth: Payer: Self-pay | Admitting: Cardiovascular Disease

## 2023-10-03 NOTE — Telephone Encounter (Signed)
   Pre-operative Risk Assessment    Patient Name: LEVII HAIRFIELD  DOB: Dec 11, 1940 MRN: [id number]   Date of last office visit: unknown Date of next office visit: unknwon   Request for Surgical Clearance    Procedure:   R Reverse shoulder arthroplasty  Date of Surgery:  Clearance TBD                                Surgeon:  Dr. Francena Hanly Surgeon's Group or Practice Name:  [facility] Phone number:  [phone] Fax number:  [phone]   Type of Clearance Requested:   - Medical    Type of Anesthesia:  General    Additional requests/questions:    SignedShawna Orleans   10/03/2023, 4:19 PM

## 2023-10-03 NOTE — Telephone Encounter (Signed)
Received call via stat phone. Patient states he was recently started on Entresto 24/26 twice daily. He was switched from Losartan, based on recent ECHO results in January. He states since this he has also started on Dupixent around the same time. He believes he has a reaction to the medication. He has been having issues with his HR- at rest it stays in the 80's, and up moving around it can be 110 or higher, he feels weak and has no energy to do anything. He does become dizzy at times, BP checked while on the phone it was 171 / 86 HR 95. He just does not feel well. They gave him a steroid shot yesterday for his shoulder, and states he woke up with lightheadedness. He requested an appointment to be seen.   Patient denies facial swelling, difficulty breathing, SOB, only issues with his blood pressure, heart rate and weakness.   Scheduled with NP for tomorrow- aware of upcoming weather may adjust schedule, however patient still requested to be seen. Patient aware I would send a message to MD and NP who will be seeing patient to advise of holding medication and or any other recommendations they may have and will call back.  Thanks!

## 2023-10-03 NOTE — Telephone Encounter (Signed)
No,  this medication is not associated with blood pressure nor heart rate changes.

## 2023-10-03 NOTE — Telephone Encounter (Signed)
Will discuss potential medication interactions a return appointment.

## 2023-10-03 NOTE — Telephone Encounter (Signed)
Pt c/o medication issue:  1. Name of Medication:  sacubitril-valsartan (ENTRESTO) 24-26 MG   2. How are you currently taking this medication (dosage and times per day)? As prescribed   3. Are you having a reaction (difficulty breathing--STAT)? Yes  4. What is your medication issue? Patient feels this medication in addition to the Dupixent he was recently started on before the entresto is causing a reaction. He reports his HR is ranging 110 moving and 70's-80's at rest. He reports extreme exhaustion, feeling weak and dizzy. His BP around 9:00 an it was 163/86 HR 90. Patient checked BP while on the phone which read 171/86 HR 95. Patient also mentioned getting a predisone shot yesterday that he didn't feel any effects from until this morning when he woke up with lightheadedness. Due to active symptoms call was transferred to triage.

## 2023-10-04 ENCOUNTER — Ambulatory Visit: Payer: HMO | Admitting: Cardiology

## 2023-10-04 IMAGING — DX DG TIBIA/FIBULA 2V*R*
4 series · 4 of 4 positions shown · non-contrast
Comparison: None.

CLINICAL DATA: Right lower leg pain 4 days after a fall. Ecchymosis

EXAM:
RIGHT TIBIA AND FIBULA - 2 VIEW

[tib/fib ap (1 of 2)]
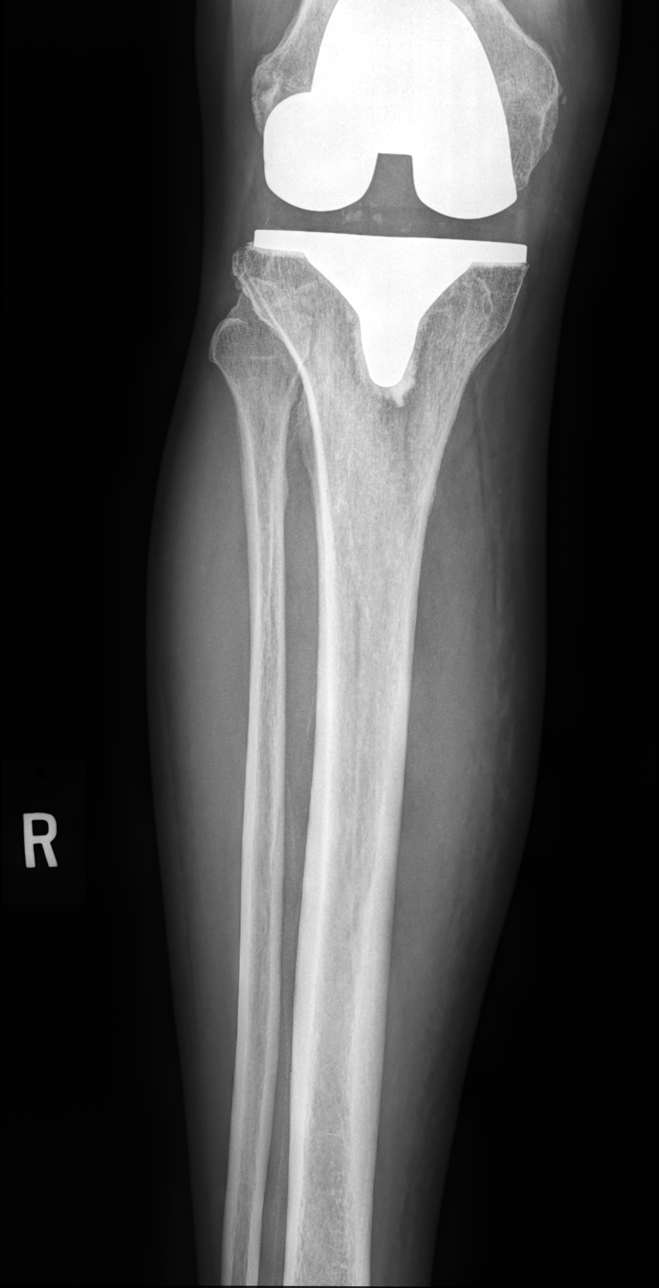

[tib/fib ap (2 of 2)]
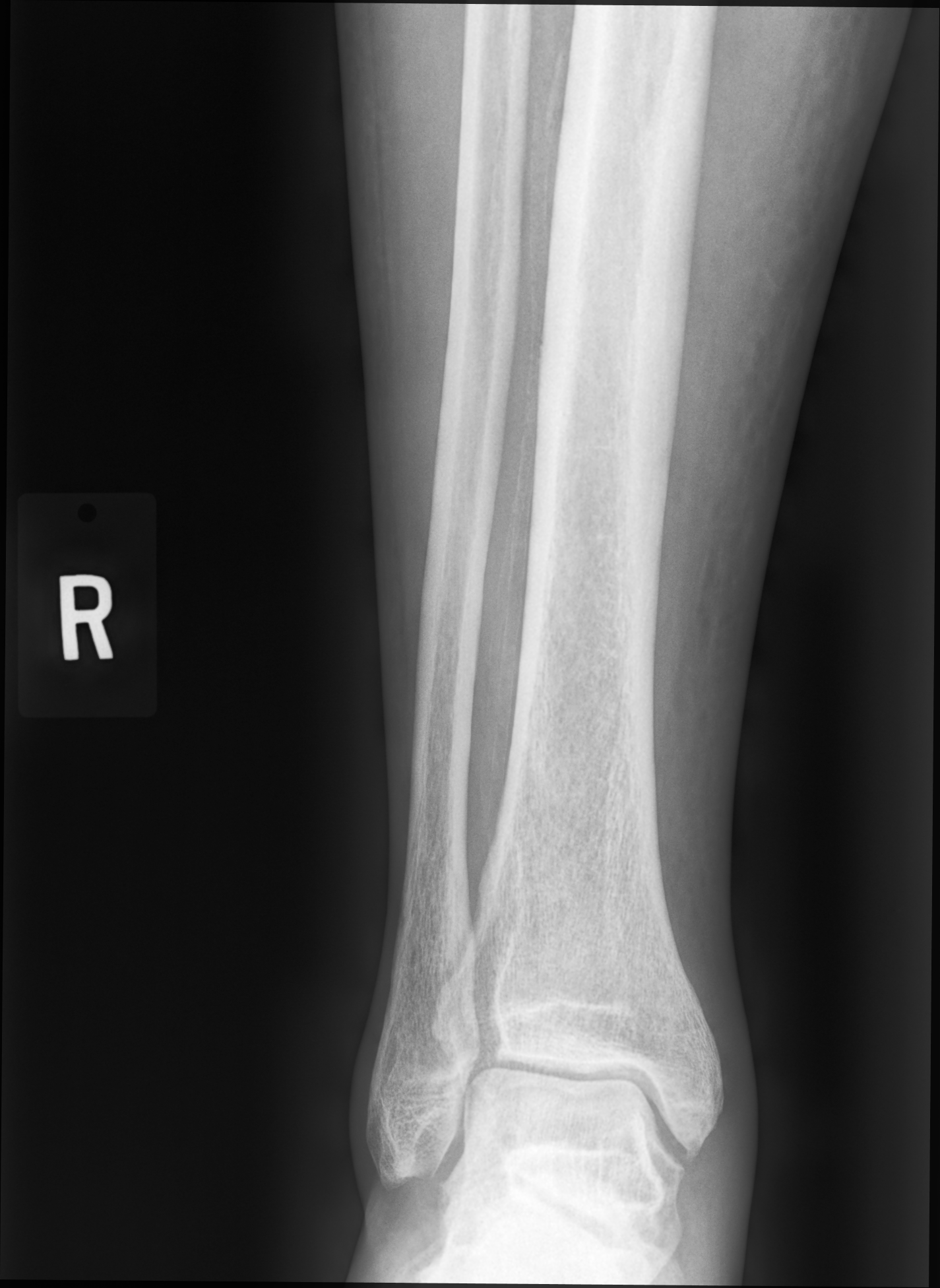

[tib/fib lat (1 of 2)]
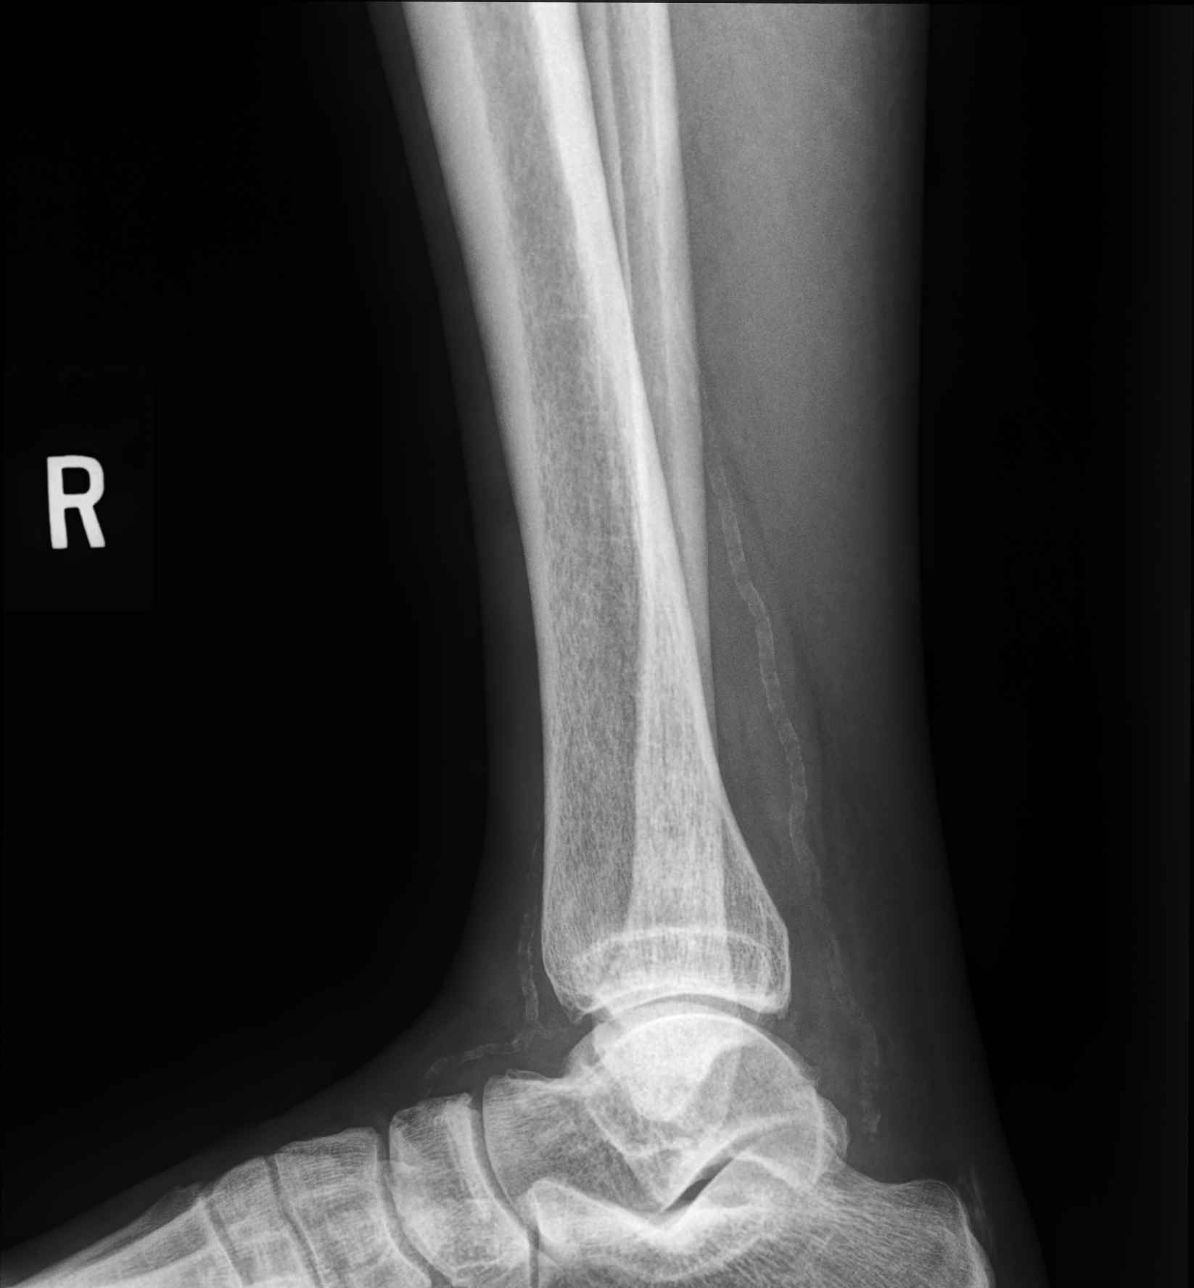

[tib/fib lat (2 of 2)]
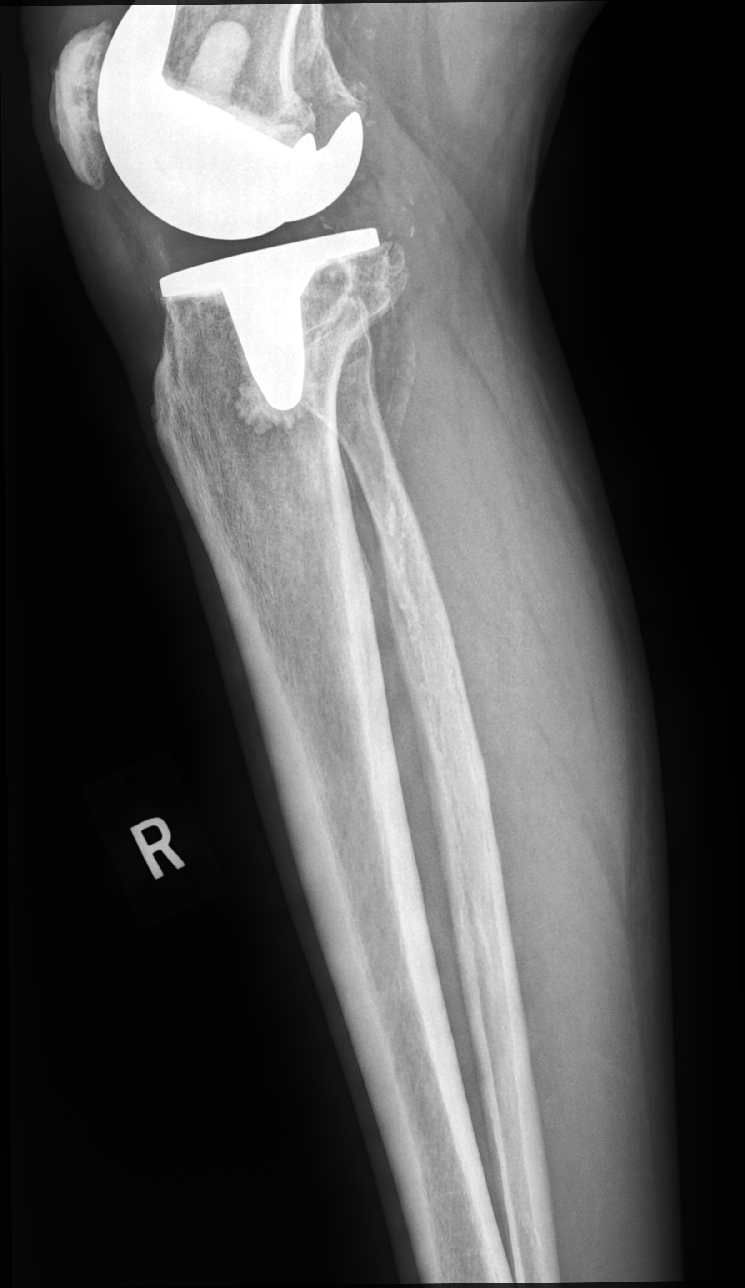

[4 of 4 positions shown; findings below may reference images not displayed]

FINDINGS: Soft tissue swelling seen at the ankle. No acute fracture or
subluxation. Generalized osteopenia. Atheromatous calcification.
Total knee arthroplasty.
IMPRESSION: No acute osseous finding.

## 2023-10-04 NOTE — Telephone Encounter (Signed)
 Noted. Thanks.

## 2023-10-05 ENCOUNTER — Ambulatory Visit: Payer: HMO | Admitting: Cardiology

## 2023-10-05 NOTE — Progress Notes (Deleted)
 Cardiology Office Note:  .   Date:  10/05/2023  ID:  [name], DOB 1940-10-14, MRN [id number] PCP: Eustaquio Boyden, MD  [facility] Providers Cardiologist:  Julien Nordmann, MD { Click to update primary MD,subspecialty MD or APP then REFRESH:1}   History of Present Illness: .   [name] is a 83 y.o. male with a past medical history of coronary disease status post remote two-vessel CABG 1990 with subsequent PCI/BMS to the left circumflex (2022), HFrEF secondary to ICM, PAD, bladder cancer status post TURP (11/2022), undergoing BCG, nonobstructive carotid artery disease, left retinal artery occlusion, peripheral neuropathy, COPD, type 2 diabetes, hypertension, hyperlipidemia, spinal stenosis with chronic lower extremity pain and neuropathy, DVT in the left lower extremity, BPH, GERD, and OSA who presents for follow-up of his HFrEF and coronary artery disease.  He also is requiring preoperative cardiovascular exam for a right shoulder reversal.   [name] underwent two-vessel CABG in 1990.  LHC in 2002 with successful PCI/BMS to the LCx.  LHC from 06/2015 showed 2 of 2 patent grafts with a patent LCx stent.  He is known to have small vessel and diffuse RCA disease which has been medically managed with long-acting nitrate.  In 08/2018, he underwent lower extremity arterial angiography, in the setting of ongoing lower extremity pain and abnormal duplex.  This showed diffuse and calcified bilateral SFA disease with three-vessel runoff below the knee bilaterally.  Medical therapy was recommended.  He was admitted to the hospital in 05/2019 with COVID.  Echo at that time demonstrated new LV systolic dysfunction with an EF of 35 to 40%.  Follow-up echo in early 2021 showed improvement in LV systolic function with an EF of 50 to 55%.  He had a low risk stress test in 05/2020 which was performed prior to back surgery.  In 03/2021 he complained of chest tightness and dyspnea.  Subsequent  echo demonstrated an EF of 50 to 55% with normal RV systolic function and a PASP of 44.7 mmHg.  He subsequently underwent Lexiscan MPI in 04/2021, in the setting of admission for unstable angina and dyspnea, which was low risk and without evidence of ischemia.  PFTs in 03/2021 showed mild to moderate obstructive lung disease.  Carotid artery ultrasound in 11/2022 showed less than 50% bilateral ICA stenosis.  He had been treated for COPD exacerbation/bronchitis with antibiotics.  During this time, he underwent echo on 11/22/2022, which demonstrated an EF of 40 to 45%, global hypokinesis, grade 1 diastolic dysfunction, normal RV systolic function and ventricular cavity size, mildly elevated PASP estimated at 39.8 mmHg, mildly dilated left atrium, mild mitral regurgitation, mild aortic stenosis with a mean gradient of 12 mmHg, and an estimated right atrial pressure of 3 mmHg.  Lexiscan MPI on 12/27/2022 was high risk with a moderate in size, severe, partially reversible basal and mid inferior/inferolateral defect consistent with ischemia and an element of scar.  LVEF 25 to 35%.  CT imaging showed post CABG findings with coronary artery calcification and aortic atherosclerosis.  A 1.4 cm hypodensity was noted in the liver similar to CT from 09/2021.  When compared to study from 2022, the inferior/inferolateral defect and reduced LVEF were new.  Given abnormal findings, it was recommended the patient increase Imdur to 30 mg twice daily and appointment to be scheduled for today.  Echocardiogram ordered in April 2024 for shortness of breath and bronchitis showed newly reduced LVEF of 40-45% with G1 DD.  Further evaluation with Myoview Lexiscan  was ordered and completed 01/10/2023 which was high risk is showing moderate in size, severity, partially reversible basal and mid inferior, inferior lateral defect consistent with ischemia and element of scar.  He underwent right and left heart catheterization 01/03/2023 that showed left  dominant coronary arteries with severe three-vessel CAD.  Patent grafts including LIMA to LAD, saphenous to the first and second diagonals, patent mid left circumflex stent.  He was noted to have subacute total occlusion of the mid to distal left circumflex with collaterals from the LAD.  Right heart catheter showed mildly elevated RA pressures, moderate pulmonary hypertension, and moderately to severely elevated wedge pressures with normal cardiac output.  He was treated with DES in mid/distal left circumflex.  He was started on DAPT with aspirin and Plavix for minimum of 6 months.   He was last seen in clinic 08/11/2023.  Given intermittent 6 since his stent placement he was scheduled for a limited echocardiogram to reassess his LV function.  GDMT was previously limited in the setting of renal dysfunction however his most recent GFR being 52.  His EF now recovered and completed to switch ARB to [facility].  He was scheduled for repeat BMP.  He was continued on carvedilol 3.25 mg twice daily, losartan 25 mg daily, furosemide 20 mg daily.  Limited echocardiogram completed 09/05/2023 revealed an LVEF of 40-45%, G1 DD, and mild MR.  He was recommended that he start Entresto 24/26 mg twice daily and discontinue his losartan therapy.  Patient originally had declined starting the medication until it been approved by Dr. Mariah Milling.  He returns to clinic today  ROS: 10 point review of systems has been reviewed and considered negative with exception was been listed in the HPI  Studies Reviewed: .       2D echo 09/05/2023 1. Left ventricular ejection fraction, by estimation, is 40 to 45%. The  left ventricle has mildly decreased function. The left ventricle  demonstrates global hypokinesis. Left ventricular diastolic parameters are  consistent with Grade I diastolic  dysfunction (impaired relaxation). The global longitudinal strain is  abnormal.   2. Right ventricular systolic function is normal. The right  ventricular  size is normal.   3. Left atrial size was mildly dilated.   4. The mitral valve is normal in structure. Mild mitral valve  regurgitation. No evidence of mitral stenosis.   5. The aortic valve is normal in structure. Aortic valve regurgitation is  not visualized. Aortic valve sclerosis is present, with no evidence of  aortic valve stenosis.   6. The inferior vena cava is normal in size with greater than 50%  respiratory variability, suggesting right atrial pressure of 3 mmHg.   Event Monitor (ZIO) 03/03/2023 Normal sinus rhythm Patient had a min HR of 49 bpm, max HR of 152 bpm, and avg HR of 75 bpm.    2 Supraventricular Tachycardia runs occurred, the run with the fastest interval lasting 15 beats with a max rate of 152 bpm (avg 135 bpm); the run with the fastest interval was also the longest.  Not patient triggered   Isolated SVEs were rare (<1.0%), SVE Couplets were rare (<1.0%), and SVE Triplets were rare (<1.0%).  Isolated VEs were rare (<1.0%), VE Couplets were rare (<1.0%), and no VE Triplets were present.  R/LHC 01/13/2023   Ost LM to LM lesion is 50% stenosed.   Ost LAD to Prox LAD lesion is 100% stenosed.   Mid LAD lesion is 60% stenosed.   2nd Diag lesion  is 50% stenosed.   Prox RCA-1 lesion is 99% stenosed.   Prox RCA-2 lesion is 90% stenosed.   Prox Cx to Mid Cx lesion is 5% stenosed.   Ost Cx to Prox Cx lesion is 40% stenosed.   Mid Cx to Dist Cx lesion is 99% stenosed.   Dist Cx lesion is 50% stenosed.   Dist LAD lesion is 40% stenosed.   A drug-eluting stent was successfully placed using a STENT ONYX FRONTIER [id number].   Post intervention, there is a 10% residual stenosis.   LIMA and is moderate in size.   and is anatomically normal.   The graft exhibits no disease.   There is moderate left ventricular systolic dysfunction.   LV end diastolic pressure is moderately elevated.   The left ventricular ejection fraction is 35-45% by visual estimate.   1.   Left dominant coronary arteries with severe three-vessel coronary artery disease.  Patent grafts including LIMA to LAD and SVG to first and second diagonals.  Patent mid left circumflex stent.  However, there is subtotal occlusion of the mid to distal left circumflex with collaterals from the LAD. 2.  Moderately reduced LV systolic function. 3.  Right heart catheterization showed mildly elevated RA pressure, moderate pulmonary hypertension, moderately to severely elevated wedge pressure and normal cardiac output. 4.  Difficult but successful angioplasty and drug-eluting stent placement to the mid/distal left circumflex.  The procedure was difficult due to calcifications and subtotal occlusion with difficulty crossing.   Recommendations: Dual antiplatelet therapy for at least 6 months and preferably longer. Aggressive treatment of risk factors. I increased the dose of oral furosemide to 40 mg twice daily which will be continued while hospitalized and then likely decreased to 40 mg once daily upon discharge.  He was only taking 20 mg daily of furosemide.   Lexiscan MPI 12/27/2022:   Abnormal pharmacologic myocardial perfusion stress test.   There is a moderate in size, severe, partially reversible basal and mid inferior/inferolateral defect consistent with ischemia and an element of scar.   Left ventricular systolic function is severely reduced (LVEF 25-35%).   Post-CABG findings, coronary artery calcification, and aortic atherosclerosis are noted on the attenuation correction CT.   1.4 cm hypodensity is noted in the liver, similar to the prior abdominal CT from 10/04/2021.   Compared with the prior study from 04/22/2021, inferior/inferolateral defect and reduced LVEF are new.   This is a high-risk study.   2D echo 11/22/2022: 1. Left ventricular ejection fraction, by estimation, is 40 to 45%. Left  ventricular ejection fraction by 3D volume is 42 %. The left ventricle has  mildly decreased function.  The left ventricle demonstrates global  hypokinesis. Left ventricular diastolic   parameters are consistent with Grade I diastolic dysfunction (impaired  relaxation). The average left ventricular global longitudinal strain is  -10.2 %.   2. Right ventricular systolic function is normal. The right ventricular  size is normal. There is mildly elevated pulmonary artery systolic  pressure. The estimated right ventricular systolic pressure is 39.8 mmHg.   3. Left atrial size was mildly dilated.   4. The mitral valve is normal in structure. Mild mitral valve  regurgitation. No evidence of mitral stenosis.   5. The aortic valve is normal in structure. Aortic valve regurgitation is  not visualized. Mild aortic valve stenosis. Aortic valve mean gradient  measures 12.0 mmHg.   6. The inferior vena cava is normal in size with greater than 50%  respiratory variability, suggesting  right atrial pressure of 3 mmHg.   Comparison(s): 03/23/21 50-55%, Mild LVH, RVSP 45.    Carotid artery ultrasound 10/12/2022: IMPRESSION: Mild-to-moderate bilateral carotid bifurcation plaque with Doppler measurements indicative of less than 50% stenosis.   Lexiscan MPI 04/2021: Pharmacological myocardial perfusion imaging study with no significant  ischemia Normal wall motion, EF estimated at 52% No EKG changes concerning for ischemia at peak stress or in recovery. Low risk scan  2D echo 03/2021: 1. Left ventricular ejection fraction, by estimation, is 50 to 55%. The  left ventricle has low normal function. The left ventricle has no regional  wall motion abnormalities. There is mild left ventricular hypertrophy.  Left ventricular diastolic  parameters are consistent with Grade I diastolic dysfunction (impaired  relaxation).   2. Right ventricular systolic function is normal. The right ventricular  size is normal. There is mildly elevated pulmonary artery systolic  pressure. The estimated right ventricular systolic  pressure is 44.7 mmHg.   3. Left atrial size was mild to moderately dilated.   4. The mitral valve is normal in structure. Trivial mitral valve  regurgitation. No evidence of mitral stenosis.   5. The aortic valve is normal in structure. Aortic valve regurgitation is  not visualized. Mild to moderate aortic valve sclerosis/calcification is  present, without any evidence of aortic stenosis.  See Epic for more remote studies Risk Assessment/Calculations:     No BP recorded.  {Refresh Note OR Click here to enter BP  :1}***       Physical Exam:   VS:  There were no vitals taken for this visit.   Wt Readings from Last 3 Encounters:  09/27/23 206 lb (93.4 kg)  09/08/23 211 lb 4 oz (95.8 kg)  08/11/23 202 lb 9.6 oz (91.9 kg)    GEN: Well nourished, well developed in no acute distress NECK: No JVD; No carotid bruits CARDIAC: ***RRR, no murmurs, rubs, gallops RESPIRATORY:  Clear to auscultation without rales, wheezing or rhonchi  ABDOMEN: Soft, non-tender, non-distended EXTREMITIES:  No edema; No deformity   ASSESSMENT AND PLAN: .   Ischemic cardiomyopathy/HFmrEF Coronary artery disease Hyperlipidemia Peripheral arterial disease Bilateral carotid artery stenosis Hypertension COPD/emphysema Preoperative cardiovascular examination    {Are you ordering a CV Procedure (e.g. stress test, cath, DCCV, TEE, etc)?   Press F2        :[id number]}  Dispo: ***  Signed, Kaitlinn Iversen, NP

## 2023-10-06 ENCOUNTER — Ambulatory Visit: Payer: HMO

## 2023-10-06 ENCOUNTER — Telehealth: Payer: Self-pay

## 2023-10-06 ENCOUNTER — Encounter: Payer: Self-pay | Admitting: Cardiology

## 2023-10-06 ENCOUNTER — Ambulatory Visit: Payer: HMO | Attending: Cardiology | Admitting: Cardiology

## 2023-10-06 VITALS — BP 132/68 | HR 75 | Ht 72.0 in | Wt 206.4 lb

## 2023-10-06 DIAGNOSIS — J449 Chronic obstructive pulmonary disease, unspecified: Secondary | ICD-10-CM | POA: Diagnosis not present

## 2023-10-06 DIAGNOSIS — I2510 Atherosclerotic heart disease of native coronary artery without angina pectoris: Secondary | ICD-10-CM

## 2023-10-06 DIAGNOSIS — I255 Ischemic cardiomyopathy: Secondary | ICD-10-CM | POA: Diagnosis not present

## 2023-10-06 DIAGNOSIS — I5020 Unspecified systolic (congestive) heart failure: Secondary | ICD-10-CM | POA: Diagnosis not present

## 2023-10-06 DIAGNOSIS — E782 Mixed hyperlipidemia: Secondary | ICD-10-CM

## 2023-10-06 DIAGNOSIS — I739 Peripheral vascular disease, unspecified: Secondary | ICD-10-CM

## 2023-10-06 DIAGNOSIS — Z01810 Encounter for preprocedural cardiovascular examination: Secondary | ICD-10-CM | POA: Diagnosis not present

## 2023-10-06 DIAGNOSIS — I10 Essential (primary) hypertension: Secondary | ICD-10-CM

## 2023-10-06 DIAGNOSIS — I6523 Occlusion and stenosis of bilateral carotid arteries: Secondary | ICD-10-CM

## 2023-10-06 DIAGNOSIS — R002 Palpitations: Secondary | ICD-10-CM

## 2023-10-06 DIAGNOSIS — E785 Hyperlipidemia, unspecified: Secondary | ICD-10-CM

## 2023-10-06 MED ORDER — LOSARTAN POTASSIUM 25 MG PO TABS: 25.0000 mg | ORAL_TABLET | Freq: Every day | ORAL | 3 refills | Status: AC

## 2023-10-06 NOTE — Telephone Encounter (Signed)
Per Dr. Mariah Milling:  Recommend he try to delay procedure as long as possible especially if elective  Otherwise acceptable risk for surgery  When time for surgery would hold Plavix 5 days prior to surgery, stay on aspirin  Restart Plavix following procedure  No further cardiac testing needed  Thx  TGollan

## 2023-10-06 NOTE — Telephone Encounter (Signed)
Received surgical clearance for patient from [facility]. Right reverse shoulder arthroplasty not yet scheduled. Patient evaluated in office for this on 09/08/23. Do you need to see in office to fill out?  I have placed in box for review.

## 2023-10-06 NOTE — Patient Instructions (Signed)
Medication Instructions:  START Losartan 25 mg once daily  STOP the Entresto *If you need a refill on your cardiac medications before your next appointment, please call your pharmacy*   Lab Work: Your provider would like for you to return in one week to have the following labs drawn: CBC and BMET.   Please go to [facility] [address] ([facility] [address], [place] [address] You do not need an appointment.  They are open from 8 am- 4:30 pm.  Lunch from 1:00 pm- 2:00 pm You will need to be fasting.   You may also go to one of the following [facility]:  [address] [place], [place] [address] Phone: [phone] Lab hours: Mon-Fri 8 am- 5 pm    Lunch 12 pm- 1 pm  [address] [place],  [place]  [address]  [place] Phone: [phone] Lab hours: 7 am- 4 pm Lunch 12 pm-1 pm   [address] [place],  [place]  [address]  [place] Phone: [phone] Lab hours: Mon-Fri 8 am- 5 pm    Lunch 12 pm- 1 pm  If you have labs (blood work) drawn today and your tests are completely normal, you will receive your results only by: MyChart Message (if you have MyChart) OR A paper copy in the mail If you have any lab test that is abnormal or we need to change your treatment, we will call you to review the results.   Follow-Up: At [facility], you and your health needs are our priority.  As part of our continuing mission to provide you with exceptional heart care, we have created designated Provider Care Teams.  These Care Teams include your primary Cardiologist (physician) and Advanced Practice Providers (APPs -  Physician Assistants and Nurse Practitioners) who all work together to provide you with the care you need, when you need it.  We recommend signing up for the patient portal called "MyChart".  Sign up information is provided on this After Visit Summary.  MyChart is used to connect with patients for Virtual Visits (Telemedicine).  Patients are able to view lab/test  results, encounter notes, upcoming appointments, etc.  Non-urgent messages can be sent to your provider as well.   To learn more about what you can do with MyChart, go to [email or web address].    Your next appointment:   4-6 week(s)  Provider:   You may see Julien Nordmann, MD or one of the following Advanced Practice Providers on your designated Care Team:   Nicolasa Ducking, NP Eula Listen, PA-C Cadence Fransico Michael, PA-C Charlsie Quest, NP Carlos Levering, NP    Other Instructions Christena Deem- Long Term Monitor Instructions  Your physician has requested you wear a ZIO patch monitor for 7 days.  This is a single patch monitor. [employer] supplies one patch monitor per enrollment. Additional stickers are not available. Please do not apply patch if you will be having a Nuclear Stress Test,  Echocardiogram, Cardiac CT, MRI, or Chest Xray during the period you would be wearing the  monitor. The patch cannot be worn during these tests. You cannot remove and re-apply the  ZIO XT patch monitor.  Your ZIO patch monitor will be mailed 3 day USPS to your address on file. It may take 3-5 days  to receive your monitor after you have been enrolled.  Once you have received your monitor, please review the enclosed instructions. Your monitor  has already been registered assigning a specific monitor serial # to you.  Billing  and Patient Assistance Program Information  We have supplied [employer] with any of your insurance information on file for billing purposes. [employer] offers a sliding scale Patient Assistance Program for patients that do not have  insurance, or whose insurance does not completely cover the cost of the ZIO monitor.  You must apply for the Patient Assistance Program to qualify for this discounted rate.  To apply, please call [employer] at [phone], select option 4, select option 2, ask to apply for  Patient Assistance Program. Meredeth Ide will ask your household income, and how many people  are  in your household. They will quote your out-of-pocket cost based on that information.  [employer] will also be able to set up a 66-month, interest-free payment plan if needed.  Applying the monitor   Shave hair from upper left chest.  Hold abrader disc by orange tab. Rub abrader in 40 strokes over the upper left chest as  indicated in your monitor instructions.  Clean area with 4 enclosed alcohol pads. Let dry.  Apply patch as indicated in monitor instructions. Patch will be placed under collarbone on left  side of chest with arrow pointing upward.  Rub patch adhesive wings for 2 minutes. Remove white label marked "1". Remove the white  label marked "2". Rub patch adhesive wings for 2 additional minutes.  While looking in a mirror, press and release button in center of patch. A small green light will  flash 3-4 times. This will be your only indicator that the monitor has been turned on.  Do not shower for the first 24 hours. You may shower after the first 24 hours.  Press the button if you feel a symptom. You will hear a small click. Record Date, Time and  Symptom in the Patient Logbook.  When you are ready to remove the patch, follow instructions on the last 2 pages of Patient  Logbook. Stick patch monitor onto the last page of Patient Logbook.  Place Patient Logbook in the blue and white box. Use locking tab on box and tape box closed  securely. The blue and white box has prepaid postage on it. Please place it in the mailbox as  soon as possible. Your physician should have your test results approximately 7 days after the  monitor has been mailed back to [facility].  Call [facility] Customer Care at [phone] if you have questions regarding  your ZIO XT patch monitor. Call them immediately if you see an orange light blinking on your  monitor.  If your monitor falls off in less than 4 days, contact our Monitor department at [phone].  If your monitor becomes loose or falls  off after 4 days call [employer] at [phone] for  suggestions on securing your monitor

## 2023-10-06 NOTE — Progress Notes (Signed)
Cardiology Office Note:  .   Date:  10/06/2023  ID:  [name], DOB 07-11-1941, MRN [id number] PCP: Eustaquio Boyden, MD  [facility] Providers Cardiologist:  Julien Nordmann, MD    History of Present Illness: .   [name] is a 83 y.o. male with a past medical history of coronary disease status post remote two-vessel CABG 1990 with subsequent PCI/BMS to the left circumflex (2022), HFrEF secondary to ICM, PAD, bladder cancer status post TURP (11/2022), undergoing BCG, nonobstructive carotid artery disease, left retinal artery occlusion, peripheral neuropathy, COPD, type 2 diabetes, hypertension, hyperlipidemia, spinal stenosis with chronic lower extremity pain and neuropathy, DVT in the left lower extremity, BPH, GERD, and OSA who presents for follow-up of his HFrEF and coronary artery disease.  He also is requiring preoperative cardiovascular exam for a right shoulder reversal.   Mr. Mata underwent two-vessel CABG in 1990.  LHC in 2002 with successful PCI/BMS to the LCx.  LHC from 06/2015 showed 2 of 2 patent grafts with a patent LCx stent.  He is known to have small vessel and diffuse RCA disease which has been medically managed with long-acting nitrate.  In 08/2018, he underwent lower extremity arterial angiography, in the setting of ongoing lower extremity pain and abnormal duplex.  This showed diffuse and calcified bilateral SFA disease with three-vessel runoff below the knee bilaterally.  Medical therapy was recommended.  He was admitted to the hospital in 05/2019 with COVID.  Echo at that time demonstrated new LV systolic dysfunction with an EF of 35 to 40%.  Follow-up echo in early 2021 showed improvement in LV systolic function with an EF of 50 to 55%.  He had a low risk stress test in 05/2020 which was performed prior to back surgery.  In 03/2021 he complained of chest tightness and dyspnea.  Subsequent echo demonstrated an EF of 50 to 55% with normal RV systolic  function and a PASP of 44.7 mmHg.  He subsequently underwent Lexiscan MPI in 04/2021, in the setting of admission for unstable angina and dyspnea, which was low risk and without evidence of ischemia.  PFTs in 03/2021 showed mild to moderate obstructive lung disease.  Carotid artery ultrasound in 11/2022 showed less than 50% bilateral ICA stenosis.  He had been treated for COPD exacerbation/bronchitis with antibiotics.  During this time, he underwent echo on 11/22/2022, which demonstrated an EF of 40 to 45%, global hypokinesis, grade 1 diastolic dysfunction, normal RV systolic function and ventricular cavity size, mildly elevated PASP estimated at 39.8 mmHg, mildly dilated left atrium, mild mitral regurgitation, mild aortic stenosis with a mean gradient of 12 mmHg, and an estimated right atrial pressure of 3 mmHg.  Lexiscan MPI on 12/27/2022 was high risk with a moderate in size, severe, partially reversible basal and mid inferior/inferolateral defect consistent with ischemia and an element of scar.  LVEF 25 to 35%.  CT imaging showed post CABG findings with coronary artery calcification and aortic atherosclerosis.  A 1.4 cm hypodensity was noted in the liver similar to CT from 09/2021.  When compared to study from 2022, the inferior/inferolateral defect and reduced LVEF were new.  Given abnormal findings, it was recommended the patient increase Imdur to 30 mg twice daily and appointment to be scheduled for today.  Echocardiogram ordered in April 2024 for shortness of breath and bronchitis showed newly reduced LVEF of 40-45% with G1 DD.  Further evaluation with Myoview Eugenie Birks was ordered and completed 01/10/2023 which was high risk is  showing moderate in size, severity, partially reversible basal and mid inferior, inferior lateral defect consistent with ischemia and element of scar.  He underwent right and left heart catheterization 01/03/2023 that showed left dominant coronary arteries with severe three-vessel CAD.  Patent  grafts including LIMA to LAD, saphenous to the first and second diagonals, patent mid left circumflex stent.  He was noted to have subacute total occlusion of the mid to distal left circumflex with collaterals from the LAD.  Right heart catheter showed mildly elevated RA pressures, moderate pulmonary hypertension, and moderately to severely elevated wedge pressures with normal cardiac output.  He was treated with DES in mid/distal left circumflex.  He was started on DAPT with aspirin and Plavix for minimum of 6 months.   He was last seen in clinic 08/11/2023.  Given intermittent 6 since his stent placement he was scheduled for a limited echocardiogram to reassess his LV function.  GDMT was previously limited in the setting of renal dysfunction however his most recent GFR being 52.  His EF now recovered and completed to switch ARB to [facility].  He was scheduled for repeat BMP.  He was continued on carvedilol 3.25 mg twice daily, losartan 25 mg daily, furosemide 20 mg daily.  Limited echocardiogram completed 09/05/2023 revealed an LVEF of 40-45%, G1 DD, and mild MR.  He was recommended that he start Entresto 24/26 mg twice daily and discontinue his losartan therapy.  Patient originally had declined starting the medication until it been approved by Dr. Mariah Milling.  He returns to clinic today with several concerns that he has noted since he was recently started on Entresto after his last echocardiogram.  He stated has been having varying heart rates and his heart rate is going up to with activity since starting the medication.  He also started with worsening of palpitations.  He noted that the symptoms have been as he had started [facility] but the same time that he had a cortisone injection into his shoulder.  Due to side effects of the Entresto fatigue, and palpitations he stopped taking Entresto and restarted his self on his prior losartan 25 mg daily.  He stated that since he has been taking his losartan and has been  off of the Entresto the palpitations are still there but not quite as bad and the varying heart rates and is not as extreme.  He had continued to monitor his pulse with his Apple Watch and he has a small farm and states that he had been unable to walk to his barn and back without heart rate of around 110.  He states that his Apple Watch did not noted that he was in atrial fibrillation but there was a large quantity of inconclusive readings.  He states that he has had chronic DOE and shortness of breath that has not worsened since he was admitted to [facility] COVID hospital in 2020 with an extensive hospital stay due to COVID.  He also has a history of COPD with a prior smoking history.  He denies any recent hospitalizations or visits to the emergency department.  He states around in May he is supposed to have shoulder surgery as well.  ROS: 10 point review of systems has been reviewed and considered negative with exception was been listed in the HPI  Studies Reviewed: Marland Kitchen   EKG Interpretation Date/Time:  Friday October 06 2023 11:27:47 EST Ventricular Rate:  75 PR Interval:  144 QRS Duration:  118 QT Interval:  370 QTC Calculation:  413 R Axis:   -53  Text Interpretation: Sinus rhythm with sinus arrhythmia with frequent Premature ventricular complexes Left anterior fascicular block Minimal voltage criteria for LVH, may be normal variant ( Cornell product ) Cannot rule out Inferior infarct (masked by fascicular block?) , age undetermined Cannot rule out Anterior infarct , age undetermined When compared with ECG of 11-Aug-2023 09:59, Confirmed by Charlsie Quest ([id number]) on 10/06/2023 11:50:04 AM    2D echo 09/05/2023 1. Left ventricular ejection fraction, by estimation, is 40 to 45%. The  left ventricle has mildly decreased function. The left ventricle  demonstrates global hypokinesis. Left ventricular diastolic parameters are  consistent with Grade I diastolic  dysfunction (impaired relaxation).  The global longitudinal strain is  abnormal.   2. Right ventricular systolic function is normal. The right ventricular  size is normal.   3. Left atrial size was mildly dilated.   4. The mitral valve is normal in structure. Mild mitral valve  regurgitation. No evidence of mitral stenosis.   5. The aortic valve is normal in structure. Aortic valve regurgitation is  not visualized. Aortic valve sclerosis is present, with no evidence of  aortic valve stenosis.   6. The inferior vena cava is normal in size with greater than 50%  respiratory variability, suggesting right atrial pressure of 3 mmHg.   Event Monitor (ZIO) 03/03/2023 Normal sinus rhythm Patient had a min HR of 49 bpm, max HR of 152 bpm, and avg HR of 75 bpm.    2 Supraventricular Tachycardia runs occurred, the run with the fastest interval lasting 15 beats with a max rate of 152 bpm (avg 135 bpm); the run with the fastest interval was also the longest.  Not patient triggered   Isolated SVEs were rare (<1.0%), SVE Couplets were rare (<1.0%), and SVE Triplets were rare (<1.0%).  Isolated VEs were rare (<1.0%), VE Couplets were rare (<1.0%), and no VE Triplets were present.  R/LHC 01/13/2023   Ost LM to LM lesion is 50% stenosed.   Ost LAD to Prox LAD lesion is 100% stenosed.   Mid LAD lesion is 60% stenosed.   2nd Diag lesion is 50% stenosed.   Prox RCA-1 lesion is 99% stenosed.   Prox RCA-2 lesion is 90% stenosed.   Prox Cx to Mid Cx lesion is 5% stenosed.   Ost Cx to Prox Cx lesion is 40% stenosed.   Mid Cx to Dist Cx lesion is 99% stenosed.   Dist Cx lesion is 50% stenosed.   Dist LAD lesion is 40% stenosed.   A drug-eluting stent was successfully placed using a STENT ONYX FRONTIER 3.0X18.   Post intervention, there is a 10% residual stenosis.   LIMA and is moderate in size.   and is anatomically normal.   The graft exhibits no disease.   There is moderate left ventricular systolic dysfunction.   LV end diastolic  pressure is moderately elevated.   The left ventricular ejection fraction is 35-45% by visual estimate.   1.  Left dominant coronary arteries with severe three-vessel coronary artery disease.  Patent grafts including LIMA to LAD and SVG to first and second diagonals.  Patent mid left circumflex stent.  However, there is subtotal occlusion of the mid to distal left circumflex with collaterals from the LAD. 2.  Moderately reduced LV systolic function. 3.  Right heart catheterization showed mildly elevated RA pressure, moderate pulmonary hypertension, moderately to severely elevated wedge pressure and normal cardiac output. 4.  Difficult but successful angioplasty  and drug-eluting stent placement to the mid/distal left circumflex.  The procedure was difficult due to calcifications and subtotal occlusion with difficulty crossing.   Recommendations: Dual antiplatelet therapy for at least 6 months and preferably longer. Aggressive treatment of risk factors. I increased the dose of oral furosemide to 40 mg twice daily which will be continued while hospitalized and then likely decreased to 40 mg once daily upon discharge.  He was only taking 20 mg daily of furosemide.   Lexiscan MPI 12/27/2022:   Abnormal pharmacologic myocardial perfusion stress test.   There is a moderate in size, severe, partially reversible basal and mid inferior/inferolateral defect consistent with ischemia and an element of scar.   Left ventricular systolic function is severely reduced (LVEF 25-35%).   Post-CABG findings, coronary artery calcification, and aortic atherosclerosis are noted on the attenuation correction CT.   1.4 cm hypodensity is noted in the liver, similar to the prior abdominal CT from 10/04/2021.   Compared with the prior study from 04/22/2021, inferior/inferolateral defect and reduced LVEF are new.   This is a high-risk study.   2D echo 11/22/2022: 1. Left ventricular ejection fraction, by estimation, is 40 to 45%.  Left  ventricular ejection fraction by 3D volume is 42 %. The left ventricle has  mildly decreased function. The left ventricle demonstrates global  hypokinesis. Left ventricular diastolic   parameters are consistent with Grade I diastolic dysfunction (impaired  relaxation). The average left ventricular global longitudinal strain is  -10.2 %.   2. Right ventricular systolic function is normal. The right ventricular  size is normal. There is mildly elevated pulmonary artery systolic  pressure. The estimated right ventricular systolic pressure is 39.8 mmHg.   3. Left atrial size was mildly dilated.   4. The mitral valve is normal in structure. Mild mitral valve  regurgitation. No evidence of mitral stenosis.   5. The aortic valve is normal in structure. Aortic valve regurgitation is  not visualized. Mild aortic valve stenosis. Aortic valve mean gradient  measures 12.0 mmHg.   6. The inferior vena cava is normal in size with greater than 50%  respiratory variability, suggesting right atrial pressure of 3 mmHg.   Comparison(s): 03/23/21 50-55%, Mild LVH, RVSP 45.    Carotid artery ultrasound 10/12/2022: IMPRESSION: Mild-to-moderate bilateral carotid bifurcation plaque with Doppler measurements indicative of less than 50% stenosis.   Lexiscan MPI 04/2021: Pharmacological myocardial perfusion imaging study with no significant  ischemia Normal wall motion, EF estimated at 52% No EKG changes concerning for ischemia at peak stress or in recovery. Low risk scan  2D echo 03/2021: 1. Left ventricular ejection fraction, by estimation, is 50 to 55%. The  left ventricle has low normal function. The left ventricle has no regional  wall motion abnormalities. There is mild left ventricular hypertrophy.  Left ventricular diastolic  parameters are consistent with Grade I diastolic dysfunction (impaired  relaxation).   2. Right ventricular systolic function is normal. The right ventricular  size is  normal. There is mildly elevated pulmonary artery systolic  pressure. The estimated right ventricular systolic pressure is 44.7 mmHg.   3. Left atrial size was mild to moderately dilated.   4. The mitral valve is normal in structure. Trivial mitral valve  regurgitation. No evidence of mitral stenosis.   5. The aortic valve is normal in structure. Aortic valve regurgitation is  not visualized. Mild to moderate aortic valve sclerosis/calcification is  present, without any evidence of aortic stenosis.  See Epic for more  remote studies Risk Assessment/Calculations:             Physical Exam:   VS:  BP 132/68   Pulse 75   Ht 6' (1.829 m)   Wt 206 lb 6.4 oz (93.6 kg)   SpO2 96%   BMI 27.99 kg/m    Wt Readings from Last 3 Encounters:  10/06/23 206 lb 6.4 oz (93.6 kg)  09/27/23 206 lb (93.4 kg)  09/08/23 211 lb 4 oz (95.8 kg)    GEN: Well nourished, well developed in no acute distress NECK: No JVD; No carotid bruits CARDIAC: RRR, no murmurs, rubs, gallops RESPIRATORY:  Clear to auscultation without rales, wheezing or rhonchi  ABDOMEN: Soft, non-tender, non-distended EXTREMITIES:  No edema; No deformity   ASSESSMENT AND PLAN: .   Ischemic cardiomyopathy/HFmrEF with last echocardiogram done 08/2023 with an LVEF of 40-45%, global hypokinesis, G1 DD and mild mitral regurgitation.  His last heart catheterization was completed on 01/03/2023 showed subtotal occlusion of the mid to distal left circumflex with collaterals from the LAD.  DES was placed to the mid distal left circumflex.  He is euvolemic and well compensated on exam with NYHA a class II symptoms.  Previously after his echocardiogram he was started on Entresto and his losartan was discontinued he started to have side effects to the medication of fatigue and palpitations with varying heart rate and stop the Entresto on his own returned to losartan 25 mg daily.  Unfortunately if he changed to [facility] he had no follow-up labs that were  completed.  He has been scheduled for an updated BMP today to reevaluate kidney function.  Will continue to try to escalate GDMT as tolerated he is continued on carvedilol 3.25 mg twice daily losartan 25 mg daily, furosemide 20 mg once daily.  We did discuss after his labs about starting MRA therapy of spironolactone 12.5 mg daily we also discussed SGLT2 inhibitor therapy which patient is concerned about cost.  Palpitations that have been ongoing since he started [place].  Since stopping Entresto he has noted an average heart rate may be around 120s walking which is improved 4.  He is continued on carvedilol 3.25 mg twice daily.  He is also being placed on a ZIO XT monitor for 7 days.  To rule out any arrhythmia like atrial fibrillation and atrial flutter.  He is also encouraged to continue to monitor his pulse with his Apple Watch.  Coronary artery disease status post two-vessel CABG in 1998 with subsequent PCI/BMS left circumflex in 2002.  Currently stable with no anginal symptoms.  No indication for further ischemic evaluation at this time.  Last left heart catheterization in 01/03/2023 with successful DES placement with recommendation of For 1 year.  He has continued on aspirin 81 mg daily, clopidogrel 75 mg daily, and carvedilol 3.125 mg twice daily, ezetimibe 10 mg daily, atorvastatin 80 mg, isosorbide 30 mg daily, losartan 25 mg daily, Ranexa 500 mg twice daily, and sublingual nitroglycerin.  EKG today reveals sinus rhythm with sinus arrhythmia with a rate of 75 left anterior fascicular block, LVH, and no acute changes noted.  Hyperlipidemia with his most recent lipid panel revealed an LDL cholesterol of 59.  Has remained stable.  He has been continued on atorvastatin 80 mg daily and ezetimibe 10 mg daily.  Peripheral arterial disease that he continues to follow with Dr. Renato Gails.  Lower arterial duplex revealed bilateral SFA disease.  He is currently without claudication and rest pain.  Continue with  medical management with aspirin and clopidogrel.  Bilateral carotid artery stenosis.  With ultrasound of his carotids completed 10/12/2022 showing mild to moderate bilateral carotid bifurcation plaque with Doppler measurements less than 50% stenosis.  Recommend continue routine monitoring.  He continues to deny dizziness or syncope or near syncope.  Hypertension with a blood pressure today 132/68 which remained stable.  He continues to taking his losartan 25 mg daily carvedilol 3.125 mg twice daily and Lasix.  He has been encouraged to continue to monitor his pressure 1 to 2 hours postmedication administration at home as well.  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  Preoperative cardiovascular examination    Mr. Gonyer's perioperative risk of a major cardiac event is 6.6% according to the Revised Cardiac Risk Index (RCRI).  Therefore, he is at high risk for perioperative complications.   His functional capacity is fair at 5.19 METs according to the [facility] Activity Status Index (DASI). Recommendations: According to ACC/AHA guidelines, no further cardiovascular testing needed.  The patient may proceed to surgery at acceptable risk.         Dispo: Patient return to clinic to see MD/APP in 6 weeks or sooner for further evaluation of symptoms.  Signed, Jeannifer Drakeford, NP

## 2023-10-10 ENCOUNTER — Other Ambulatory Visit: Payer: Self-pay | Admitting: Cardiovascular Disease

## 2023-10-11 ENCOUNTER — Encounter: Payer: Self-pay | Admitting: Pulmonary Disease

## 2023-10-11 ENCOUNTER — Ambulatory Visit: Payer: HMO | Admitting: Pulmonary Disease

## 2023-10-11 VITALS — BP 112/70 | HR 76 | Temp 97.1°F | Ht 72.0 in | Wt 206.2 lb

## 2023-10-11 DIAGNOSIS — J4489 Other specified chronic obstructive pulmonary disease: Secondary | ICD-10-CM

## 2023-10-11 DIAGNOSIS — J4540 Moderate persistent asthma, uncomplicated: Secondary | ICD-10-CM | POA: Diagnosis not present

## 2023-10-11 DIAGNOSIS — R002 Palpitations: Secondary | ICD-10-CM | POA: Diagnosis not present

## 2023-10-11 DIAGNOSIS — I10 Essential (primary) hypertension: Secondary | ICD-10-CM | POA: Diagnosis not present

## 2023-10-11 DIAGNOSIS — G4733 Obstructive sleep apnea (adult) (pediatric): Secondary | ICD-10-CM

## 2023-10-11 DIAGNOSIS — I429 Cardiomyopathy, unspecified: Secondary | ICD-10-CM

## 2023-10-11 LAB — NITRIC OXIDE: Nitric Oxide: 41

## 2023-10-11 NOTE — Progress Notes (Signed)
 Subjective:    Patient ID: [name], male    DOB: 02-15-1941, 83 y.o.   MRN: [id number]  Patient Care Team: [name] Boyden, MD as PCP - General (Family Medicine) [name], [facility] (Inactive) as Pharmacist (Pharmacist) [name] Iba, MD as Consulting Physician (Cardiology) [name] Saner, MD as Consulting Physician (Pulmonary Disease) [name] Ouch, MD as Consulting Physician (Cardiology)  Chief Complaint  Patient presents with   Follow-up    No SOB or wheezing. Cough with clear sputum.    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 Patient was last seen on 11 July 2023.  At that time he had not received his Dupixent yet.  Since then, he has started Dupixent on 29 August 2023.  HPI Discussed the use of AI scribe software for clinical note transcription with the patient, who gave verbal consent to proceed.  History of Present Illness   The patient, with asthma, COPD overlap, and obstructive sleep apnea, presents for follow-up.  Breathing is generally stable, but there is significant coughing at night. There is occasional reflux, which may contribute to nighttime coughing. The patient is trying to improve dietary habits by reducing sugar intake and drinking more water, which has helped reduce nighttime coughing.  Overall however he feels that his cough is better.  Asthma symptoms, including coughing, are present, particularly at night. He uses Trelegy once daily, which has helped reduce inflammation markers. He has not needed antibiotics or steroids recently, which is an improvement from previous months. He experiences fatigue after one to two hours of activity, requiring rest.  He has a history of COPD and is currently using Trelegy once daily. He has not needed antibiotics or steroids  recently, which is an improvement from previous months.  He feels overall, that he is doing better  He has obstructive sleep apnea and uses a CPAP machine.  Overall his compliance with the device is good.  He has a right shoulder injury from a fall about a month ago, resulting in requiring a cortisone injection. He is scheduled for a reverse total shoulder replacement but is unsure of the date. The shoulder does not hurt currently, but the injection site is bothersome.  He is on multiple medications including gabapentin three times a day, Flomax, and Pepcid. He takes Pepcid before meals and at bedtime. He has been prescribed antibiotics and steroids in the past but has not needed them recently.     He has been compliant with Dupixent.  Overall he feels that this may be helping him.  Had issues with [facility] for cardiomyopathy which he did not tolerate.  States that cardiology is considering [place].  Compliance download from his CPAP (auto CPAP 5-15) shows 100% usage over 30 days with 25 of those days over 4 hours for a compliance of over 4 hours days of 83%.  Residual AHI is 6 somewhat higher than previous.  This may be related to some issues with mask leak   Review of Systems A 10 point review of systems was performed and it is as noted above otherwise negative.   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   OSA (obstructive sleep apnea)    Chronic heart failure with preserved ejection fraction (HFpEF) (HCC)     Right leg pain 06/22/2021   Nocturnal hypoxia 06/11/2021   CKD (chronic kidney disease) stage 3, GFR 30-59 ml/min (HCC) 02/24/2021   Peripheral neuropathy 02/23/2021   Spondylolisthesis of lumbar region 07/07/2020   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   Coccydynia 09/20/2019   Nasal sinus congestion 07/19/2019   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   Anxiety 06/04/2019   Chronic right shoulder pain 09/10/2018   Cervical stenosis of spinal canal 06/05/2017   Pedal edema 06/05/2017   Health maintenance examination 12/06/2016   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/13/2016   Overweight (BMI 25.0-29.9) 07/04/2016   Low vitamin B12 level 01/01/2016   Exertional dyspnea 07/07/2015   Unstable angina (HCC)    Medicare annual wellness visit, subsequent 07/03/2015   Advanced care planning/counseling discussion 07/03/2015   Type 2 diabetes mellitus with other specified complication (HCC) 05/04/2015   Pleuritic chest pain 05/04/2015   Ex-smoker 05/04/2015   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   Spermatocele 04/02/2013   Syncopal vertigo 11/04/2010   Lumbar disc disease with radiculopathy 11/04/2010   Coronary artery disease involving native coronary artery of native heart with unstable angina pectoris (HCC) 08/11/2009   PAD (peripheral artery disease) (HCC) 08/11/2009   DUPUYTREN'S CONTRACTURE, RIGHT 10/29/2008   Carotid stenosis 07/09/2008   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   Benign prostatic hyperplasia with urinary obstruction 09/06/2007   Essential hypertension 04/30/2007   GERD 04/30/2007   Hyperlipidemia associated with type 2 diabetes mellitus (HCC) 04/26/2007   OA (osteoarthritis) of knee 04/26/2007   DVT, HX OF 04/26/2007    Social History   Tobacco Use   Smoking status: Former    Current packs/day: 0.00    Average packs/day: 1 pack/day for 25.0 years (25.0 ttl pk-yrs)    Types: Cigarettes    Start date: 08/16/1963    Quit date:  08/15/1988    Years since quitting: 35.1    Passive exposure: Past   Smokeless tobacco: Never  Substance Use Topics   Alcohol use: No    Alcohol/week: 0.0 standard drinks of alcohol    Allergies  Allergen Reactions   Vioxx [Rofecoxib] Other (See Comments)    Hemorrhage    Spiriva Respimat [Tiotropium Bromide Monohydrate] Other (See Comments)    Elevated bp   Contrast Media [Iodinated Contrast Media] Itching and Rash    Delayed reaction post abdominal aortagram.    Morphine Nausea Only and Other (See Comments)    Irritability     Current Meds  Medication Sig   albuterol (PROVENTIL) (2.5 MG/3ML) 0.083% nebulizer solution Take 3 mLs (2.5 mg total) by nebulization every 6 (six) hours as needed for wheezing or shortness of breath.   albuterol (VENTOLIN HFA) 108 (90 Base) MCG/ACT inhaler TAKE 2 PUFFS INTO LUNGS EVERY 6 HOURS ASNEEDED FOR WHEEZING OR SHORTNESS OF BREATH   aspirin EC 81 MG EC tablet Take 1 tablet (81 mg total) by mouth daily.   atorvastatin (LIPITOR) 20 MG tablet TAKE 1 TABLET BY MOUTH DAILY   carvedilol (COREG) 3.125 MG tablet TAKE 1 TABLET BY MOUTH TWICE DAILY   clopidogrel (PLAVIX) 75 MG tablet  Take 1 tablet (75 mg total) by mouth daily with breakfast.   Cyanocobalamin (VITAMIN B-12 ER PO) Take 1 capsule by mouth daily.   cyclobenzaprine (FLEXERIL) 5 MG tablet Take 1 tablet (5 mg total) by mouth 3 (three) times daily as needed for muscle spasms.   docusate sodium (COLACE) 100 MG capsule Take 1 capsule (100 mg total) by mouth daily as needed for mild constipation.   Dupilumab (DUPIXENT) 300 MG/2ML SOAJ Inject 300 mg into the skin every 14 (fourteen) days. **patient completed loading dose in clinic**   ezetimibe (ZETIA) 10 MG tablet TAKE 1 TABLET BY MOUTH DAILY   famotidine (PEPCID) 20 MG tablet Take 20 mg by mouth daily.   fluticasone (FLONASE) 50 MCG/ACT nasal spray Place 1 spray into both nostrils daily as needed for allergies or rhinitis.    Fluticasone-Umeclidin-Vilant (TRELEGY ELLIPTA) 200-62.5-25 MCG/ACT AEPB Inhale 1 puff into the lungs daily.   furosemide (LASIX) 40 MG tablet Take 0.5 tablets (20 mg total) by mouth daily. Take 1 tablet (40 mg) daily as need for weight greater then 208.   gabapentin (NEURONTIN) 600 MG tablet Take 1 tablet (600 mg total) by mouth 3 (three) times daily.   HYDROcodone-acetaminophen (NORCO/VICODIN) 5-325 MG tablet Take 1 tablet by mouth 3 (three) times daily as needed for moderate pain (pain score 4-6).   hydrocortisone 2.5 % cream Apply topically 2 (two) times daily as needed (Rash).   isosorbide mononitrate (IMDUR) 30 MG 24 hr tablet Take 1 tablet (30 mg total) by mouth daily.   losartan (COZAAR) 25 MG tablet Take 1 tablet (25 mg total) by mouth daily.   montelukast (SINGULAIR) 10 MG tablet TAKE 1 TABLET BY MOUTH DAILY   mupirocin ointment (BACTROBAN) 2 % Apply 1 Application topically 2 (two) times daily.   nitroGLYCERIN (NITROSTAT) 0.4 MG SL tablet Place 1 tablet (0.4 mg total) under the tongue every 5 (five) minutes as needed for chest pain.   polyethylene glycol (MIRALAX / GLYCOLAX) 17 g packet Take 17 g by mouth daily as needed for mild constipation.    ranolazine (RANEXA) 500 MG 12 hr tablet TAKE ONE (1) TABLET BY MOUTH TWO TIMES PER DAY   tamsulosin (FLOMAX) 0.4 MG CAPS capsule TAKE 1 CAPSULE BY MOUTH AT BEDTIME    Immunization History  Administered Date(s) Administered   Fluad Quad(high Dose 65+) 04/17/2019, 04/15/2022, 05/08/2023   Influenza Whole 08/15/2000, 07/04/2007, 05/09/2008, 05/31/2010   Influenza, High Dose Seasonal PF 05/15/2013, 06/25/2015, 07/05/2021, 05/08/2023   Influenza,inj,Quad PF,6+ Mos 04/17/2014, 05/18/2016, 06/07/2018   Influenza-Unspecified 05/22/2017, 05/28/2020, 08/05/2022   PFIZER(Purple Top)SARS-COV-2 Vaccination 09/07/2019, 09/28/2019, 05/12/2020, 01/19/2021   PNEUMOCOCCAL CONJUGATE-20 09/09/2023   Pneumococcal Conjugate-13 02/09/2015   Pneumococcal  Polysaccharide-23 08/15/2000, 10/29/2008   Respiratory Syncytial Virus Vaccine,Recomb Aduvanted(Arexvy) 10/10/2022   Td 08/15/2005   Tdap 02/10/2018   Zoster Recombinant(Shingrix) 04/05/2017, 07/25/2017   Zoster, Live 02/04/2009        Objective:     BP 112/70 (BP Location: Right Arm, Cuff Size: Large)   Pulse 76   Temp (!) 97.1 F (36.2 C)   Ht 6' (1.829 m)   Wt 206 lb 3.2 oz (93.5 kg)   SpO2 97%   BMI 27.97 kg/m   SpO2: 97 % O2 Device: None (Room air)  GENERAL: This is a well-developed, overweight gentleman, chronically ill-appearing, awake, alert, no acute distress.  Fully ambulatory. HEAD: Normocephalic, atraumatic.  EYES: Pupils equal, round, reactive to light.  No scleral icterus.  MOUTH: Poor dentition, oral mucosa moist.  No thrush. NECK: Supple. No thyromegaly. Trachea midline. No JVD.  No adenopathy.  Anterior cervical fusion incision, healed. PULMONARY: Good air entry bilaterally, coarse breath sounds otherwise, no adventitious sounds.  CARDIOVASCULAR: S1 and S2. Regular rate and rhythm.  Grade 1/6 systolic ejection murmur left sternal border. GASTROINTESTINAL: Protuberant abdomen, soft, no tenderness.   MUSCULOSKELETAL: No joint deformity, no clubbing, no edema.  NEUROLOGIC: No overt focal deficits noted.  Speech is fluent.   SKIN: Intact,warm,dry.  Limited exam shows no rashes.  He has multiple purpura and ecchymoses in the upper extremities. PSYCH: Mood and behavior normal.  Lab Results  Component Value Date   NITRICOXIDE 41 10/11/2023  *Trend: 43>>52>>41   Assessment & Plan:     ICD-10-CM   1. Moderate persistent asthma without complication  J45.40 Nitric oxide    2. COPD with asthma (HCC)  J44.89     3. Cardiomyopathy, unspecified type (HCC)  I42.9     4. OSA (obstructive sleep apnea)  G47.33       Orders Placed This Encounter  Procedures   Nitric oxide   Discussion:    Asthma-COPD overlap Experiencing nocturnal coughing, potentially  related to asthma-COPD overlap.  Nitrous oxide levels have decreased from the 50s to 41, indicating improvement. Currently on Trelegy, which may not last the entire day, leading to evening symptoms. Also taking Pepcid for potential reflux contributing to the cough. Advised to use albuterol as needed and consider switching to a twice-daily inhaler if symptoms persist. - Use albuterol inhaler as needed for evening symptoms. - Consider switching to a twice-daily inhaler if symptoms persist. - Continue Trelegy and Pepcid for now. - Continue Dupixent.  Right shoulder fracture Right shoulder fracture requiring reverse total shoulder replacement. Cortisone injection delayed procedure for three months. Reports discomfort from the injection site. - Await timing for reverse total shoulder replacement. - Will address pulmonary risk assessment when closer to surgery.  Heart failure/cardiomyopathy On Entresto for heart failure but experienced adverse effects, including increased heart rate and feeling intoxicated. Discontinued Entresto, and heart rate normalized. Under cardiologist care and considering Marcelline Deist as an alternative treatment. Aware of potential need to re-establish copay arrangements for medication. - Monitor heart failure symptoms and consult with cardiologist. - Consider alternative heart failure medication as per cardiologist's recommendation.  Obstructive sleep apnea Obstructive sleep apnea on CPAP.  Adequate CPAP compliance  Follow-up Scheduled for follow-up in two months to monitor conditions and treatment efficacy. - Schedule follow-up appointment in two months.       Advised if symptoms do not improve or worsen, to please contact office for sooner follow up or seek emergency care.    I spent 40 minutes of dedicated to the care of this patient on the date of this encounter to include pre-visit review of records, face-to-face time with the patient discussing conditions above, post  visit ordering of testing, clinical documentation with the electronic health record, making appropriate referrals as documented, and communicating necessary findings to members of the patients care team.     C. Danice Goltz, MD Advanced Bronchoscopy PCCM [facility]    *This note was generated using voice recognition software/Dragon and/or AI transcription program.  Despite best efforts to proofread, errors can occur which can change the meaning. Any transcriptional errors that result from this process are unintentional and may not be fully corrected at the time of dictation.

## 2023-10-11 NOTE — Telephone Encounter (Signed)
 Faxed form, OV notes and all results to [facility] at [phone].

## 2023-10-11 NOTE — Patient Instructions (Signed)
 VISIT SUMMARY:  During your recent visit, we discussed your asthma, COPD overlap, obstructive sleep apnea, and right shoulder fracture. We also touched on your heart failure treatment. Your breathing is generally stable, but you have been experiencing significant coughing at night. Your shoulder is healing from the cortisone injection, and you are awaiting the timing for your shoulder replacement surgery. We also discussed your heart failure treatment and the potential need to switch to a different medication.  YOUR PLAN:  -ASTHMA-COPD OVERLAP: This is a condition where you have symptoms of both asthma and COPD. You are advised to use your albuterol inhaler as needed for evening symptoms and consider switching to a twice-daily inhaler if symptoms persist. Continue using Trelegy and Pepcid.  -RIGHT SHOULDER INJURY: You have a INJURY in your right shoulder that requires a reverse total shoulder replacement. You are currently experiencing discomfort from the injection site. We will await the timing for your shoulder replacement surgery.  -HEART FAILURE: This is a condition where your heart doesn't pump blood as well as it should. You have stopped taking Entresto due to adverse effects and are considering Marcelline Deist as an alternative treatment. You should monitor your heart failure symptoms and consult with your cardiologist.  -OBSTRUCTIVE SLEEP APNEA: This is a sleep disorder where your breathing repeatedly stops and starts. You are aware of this condition as part of your medical history.  INSTRUCTIONS:  Please schedule a follow-up appointment in two months to monitor your conditions and the effectiveness of your treatments.

## 2023-10-11 NOTE — Telephone Encounter (Signed)
 Filled and in Lisa's box.

## 2023-10-12 ENCOUNTER — Other Ambulatory Visit: Payer: Self-pay | Admitting: *Deleted

## 2023-10-12 DIAGNOSIS — I5022 Chronic systolic (congestive) heart failure: Secondary | ICD-10-CM

## 2023-10-12 DIAGNOSIS — I10 Essential (primary) hypertension: Secondary | ICD-10-CM

## 2023-10-12 LAB — CBC
Hematocrit: 42.7 % (ref 37.5–51.0)
Hemoglobin: 14.1 g/dL (ref 13.0–17.7)
MCH: 31.7 pg (ref 26.6–33.0)
MCHC: 33 g/dL (ref 31.5–35.7)
MCV: 96 fL (ref 79–97)
Platelets: 181 10*3/uL (ref 150–450)
RBC: 4.45 x10E6/uL (ref 4.14–5.80)
RDW: 12.4 % (ref 11.6–15.4)
WBC: 5.9 10*3/uL (ref 3.4–10.8)

## 2023-10-12 LAB — BASIC METABOLIC PANEL
BUN/Creatinine Ratio: 22 (ref 10–24)
BUN: 38 mg/dL — ABNORMAL HIGH (ref 8–27)
CO2: 22 mmol/L (ref 20–29)
Calcium: 9.4 mg/dL (ref 8.6–10.2)
Chloride: 102 mmol/L (ref 96–106)
Creatinine, Ser: 1.74 mg/dL — ABNORMAL HIGH (ref 0.76–1.27)
Glucose: 108 mg/dL — ABNORMAL HIGH (ref 70–99)
Potassium: 4.7 mmol/L (ref 3.5–5.2)
Sodium: 142 mmol/L (ref 134–144)
eGFR: 39 mL/min/{1.73_m2} — ABNORMAL LOW (ref >59)

## 2023-10-12 NOTE — Progress Notes (Signed)
 Blood counts are stable at 14.1 and WBCs are stable with no concern for infection.  Kidney function is elevated.  Recommend continuing his current medication regimen and ensuring he has adequate fluid intake throughout the day.  With recently coming off of Entresto recommend repeating his BMP in 2 weeks after being off of the Entresto to ensure kidney function returning to baseline.

## 2023-10-13 ENCOUNTER — Ambulatory Visit: Payer: Self-pay | Admitting: Family Medicine

## 2023-10-13 NOTE — Telephone Encounter (Signed)
  Chief Complaint: COVID exposure Symptoms: sore throat, hoarse voice, occasional burning in chest Frequency: x 1 day Pertinent Negatives: Patient denies fevers, SOB, CP Disposition: [] ED /[] Urgent Care (no appt availability in office) / [] Appointment(In office/virtual)/ []  [place] [facility]/ [] Home Care/ [] Refused Recommended Disposition /[] [facility]/ [x]  Follow-up with PCP Additional Notes: Pt c/o COVID + exposure from wife that tested positive this AM. Pt endorses sore throat, hoarse voice, and some burning in chest. Denies any SOB, CP, fevers. Of note, does have hx of COPD and CHF and wants PCP to prescribe Paxlovid. [facility]ager will forward encounter to PCP to review and advise. Pt verbalized understanding and awaiting call back from PCP office if Rx sent. Patient verbalized understanding and to call back/911 with worsening symptoms.    Reason for Disposition  [1] COVID-19 EXPOSURE within last 14 days AND [2] weak immune system (e.g., HIV positive, cancer chemo, splenectomy, organ transplant, chronic steroids) AND [3] NO symptoms  Answer Assessment - Initial Assessment Questions 1. COVID-19 EXPOSURE: "Please describe how you were exposed to someone with a COVID-19 infection."     Wife tested positive this AM 2. PLACE of CONTACT: "Where were you when you were exposed to COVID-19?" (e.g., home, school, medical waiting room; which city?)     home 3. TYPE of CONTACT: "How much contact was there?" (e.g., sitting next to, live in same house, work in same office, same building)     Wife, in same house 4. DURATION of CONTACT: "How long were you in contact with the COVID-19 patient?" (e.g., a few seconds, passed by person, a few minutes, 15 minutes or longer, live with the patient)     Lives with pt 5. MASK: "Were you wearing a mask?" "Was the other person wearing a mask?" Note: wearing a mask reduces the risk of an otherwise close contact.     no 6. DATE of CONTACT: "When did you  have contact with a COVID-19 patient?" (e.g., how many days ago)     10/13/2023  8. SYMPTOMS: "Do you have any symptoms?" (e.g., fever, cough, breathing difficulty, loss of taste or smell)     Slight sore throat, hoarse voice, occasional burning in chest 9. VACCINE: "Have you gotten the COVID-19 vaccine?" If Yes, ask: "Which one, how many shots, when did you get it?"     Yes, unsure how many shots  11. HIGH RISK: "Do you have any heart or lung problems?" (e.g., asthma, COPD, heart failure) "Do you have a weak immune system or other risk factors?" (e.g., HIV positive, chemotherapy, renal failure, diabetes mellitus, sickle cell anemia, obesity)       COPD, asthma, CHF  Protocols used: Coronavirus (COVID-19) Exposure-A-AH

## 2023-10-13 NOTE — Telephone Encounter (Signed)
 Copied from CRM [id number]. Topic: Clinical - Medication Question >> Oct 13, 2023  3:45 PM [name] wrote: Reason for CRM: Patient states wife has covid and he wants to know if he can have a prescription for  paxlovid called in. Has slight cough.  [name] [phone]

## 2023-10-16 ENCOUNTER — Telehealth: Payer: Self-pay | Admitting: Cardiovascular Disease

## 2023-10-16 ENCOUNTER — Emergency Department
Admission: EM | Admit: 2023-10-16 | Discharge: 2023-10-16 | Disposition: A | Discharge status: Home / Self Care | Attending: Emergency Medicine | Admitting: Emergency Medicine

## 2023-10-16 ENCOUNTER — Other Ambulatory Visit: Payer: Self-pay

## 2023-10-16 ENCOUNTER — Emergency Department

## 2023-10-16 ENCOUNTER — Encounter: Payer: Self-pay | Admitting: Pulmonary Disease

## 2023-10-16 DIAGNOSIS — R002 Palpitations: Secondary | ICD-10-CM | POA: Insufficient documentation

## 2023-10-16 DIAGNOSIS — R0602 Shortness of breath: Secondary | ICD-10-CM | POA: Diagnosis not present

## 2023-10-16 DIAGNOSIS — R000 Tachycardia, unspecified: Secondary | ICD-10-CM | POA: Insufficient documentation

## 2023-10-16 LAB — BASIC METABOLIC PANEL
Anion gap: 9 (ref 5–15)
BUN: 32 mg/dL — ABNORMAL HIGH (ref 8–23)
CO2: 24 mmol/L (ref 22–32)
Calcium: 8.9 mg/dL (ref 8.9–10.3)
Chloride: 101 mmol/L (ref 98–111)
Creatinine, Ser: 1.47 mg/dL — ABNORMAL HIGH (ref 0.61–1.24)
GFR, Estimated: 47 mL/min — ABNORMAL LOW (ref >60)
Glucose, Bld: 115 mg/dL — ABNORMAL HIGH (ref 70–99)
Potassium: 4.1 mmol/L (ref 3.5–5.1)
Sodium: 134 mmol/L — ABNORMAL LOW (ref 135–145)

## 2023-10-16 LAB — CBC
HCT: 40.3 % (ref 39.0–52.0)
Hemoglobin: 13.8 g/dL (ref 13.0–17.0)
MCH: 32.1 pg (ref 26.0–34.0)
MCHC: 34.2 g/dL (ref 30.0–36.0)
MCV: 93.7 fL (ref 80.0–100.0)
Platelets: 153 10*3/uL (ref 150–400)
RBC: 4.3 MIL/uL (ref 4.22–5.81)
RDW: 12.3 % (ref 11.5–15.5)
WBC: 9.9 10*3/uL (ref 4.0–10.5)
nRBC: 0 % (ref 0.0–0.2)

## 2023-10-16 LAB — TROPONIN I (HIGH SENSITIVITY)
Troponin I (High Sensitivity): 20 ng/L — ABNORMAL HIGH (ref <18)
Troponin I (High Sensitivity): 22 ng/L — ABNORMAL HIGH (ref <18)

## 2023-10-16 MED ORDER — SODIUM CHLORIDE 0.9 % IV BOLUS
500.0000 mL | Freq: Once | INTRAVENOUS | Status: AC
  Administered 2023-10-16: 500 mL via INTRAVENOUS

## 2023-10-16 NOTE — Telephone Encounter (Signed)
 Spoke with pt notifying him he would need to be seen for sxs. Pt states he tested pos for Covid on 10/13/23 and he's currently at [facility] ED due to elevated HR. Says it goes up to about 130 after walking. Fyi to Dr Reece Agar.

## 2023-10-16 NOTE — ED Provider Notes (Signed)
 [facility] Provider Note    Event Date/Time   First MD Initiated Contact with Patient 10/16/23 1242     (approximate)   History   Tachycardia   HPI  [name] is a 83 y.o. male who presents to the emergency department today because of concerns for tachycardia.  Patient states that he was diagnosed with COVID a few days ago.  He has been feeling somewhat okay with his symptoms.  He tested himself because his wife had tested positive.  He does think that he has been eating and drinking okay.  However today when he was walking his heart rate went up into the 130s.  He did feel racing in his chest and pounding in his head.  He says this happened to him once before with unclear etiology.  At that time he thought it was due to a new medication.     Physical Exam   Triage Vital Signs: ED Triage Vitals  Encounter Vitals Group     BP 10/16/23 1051 110/73     Systolic BP Percentile --      Diastolic BP Percentile --      Pulse Rate 10/16/23 1051 94     Resp 10/16/23 1051 20     Temp 10/16/23 1051 98.5 F (36.9 C)     Temp Source 10/16/23 1051 Oral     SpO2 10/16/23 1051 96 %     Weight 10/16/23 1052 203 lb (92.1 kg)     Height 10/16/23 1052 6' (1.829 m)     Head Circumference --      Peak Flow --      Pain Score 10/16/23 1052 0     Pain Loc --      Pain Education --      Exclude from Growth Chart --     Most recent vital signs: Vitals:   10/16/23 1051 10/16/23 1051  BP:  110/73  Pulse: 94   Resp: 20   Temp: 98.5 F (36.9 C)   SpO2: 96%    General: Awake, alert, oriented. CV:  Good peripheral perfusion. Regular rate and rhythm. Resp:  Normal effort. Lungs clear. Abd:  No distention.   ED Results / Procedures / Treatments   Labs (all labs ordered are listed, but only abnormal results are displayed) Labs Reviewed  BASIC METABOLIC PANEL - Abnormal; Notable for the following components:      Result Value   Sodium 134 (*)     Glucose, Bld 115 (*)    BUN 32 (*)    Creatinine, Ser 1.47 (*)    GFR, Estimated 47 (*)    All other components within normal limits  TROPONIN I (HIGH SENSITIVITY) - Abnormal; Notable for the following components:   Troponin I (High Sensitivity) 20 (*)    All other components within normal limits  TROPONIN I (HIGH SENSITIVITY) - Abnormal; Notable for the following components:   Troponin I (High Sensitivity) 22 (*)    All other components within normal limits  CBC     EKG  I, Phineas Semen, attending physician, personally viewed and interpreted this EKG  EKG Time: 1053 Rate: 98 Rhythm: normal sinus rhythm Axis: left axis deviation Intervals: qtc 457 QRS: narrow, q waves v1, v2 ST changes: no st elevation Impression: abnormal ekg    RADIOLOGY I independently interpreted and visualized the CXR. My interpretation: No pneumonia Radiology interpretation:  IMPRESSION:  No acute cardiopulmonary findings.  PROCEDURES:  Critical Care performed: No   MEDICATIONS ORDERED IN ED: Medications - No data to display   IMPRESSION / MDM / ASSESSMENT AND PLAN / ED COURSE  I reviewed the triage vital signs and the nursing notes.                              Differential diagnosis includes, but is not limited to, arrhythmia, dehydration, electrolyte abnormality  Patient's presentation is most consistent with acute presentation with potential threat to life or bodily function.   The patient is on the cardiac monitor to evaluate for evidence of arrhythmia and/or significant heart rate changes.  Patient presented to the emergency department today because of concerns for tachycardia in the setting of recent COVID diagnosis.  Time exam patient's heart is regular rate. Blood work without significant anemia or dehydration. Troponin minimally elevated, will repeat. EKG without concerning arrhythmia. Will give IVFs recheck troponin and if stable trial ambulation.  Repeat troponin  without any significant change. Patient did ambulate after fluids and heart rate stayed below 100. Patient without any further palpitations. Discussed findings with patient. At this time I think it is reasonable for patient to be discharged. Discussed with patient follow up with cardiology.      FINAL CLINICAL IMPRESSION(S) / ED DIAGNOSES   Final diagnoses:  Palpitations  Tachycardia      Note:  This document was prepared using Dragon voice recognition software and may include unintentional dictation errors.    Phineas Semen, MD 10/16/23 [phone]

## 2023-10-16 NOTE — ED Notes (Signed)
 Pt ambulated in hallway- HR between 91-98. Pt having no complaints ambulating. Derrill Kay, MD aware

## 2023-10-16 NOTE — Telephone Encounter (Signed)
 The nurse returned the call to the patient, who reported that he is currently being evaluated in the emergency room for his symptoms.

## 2023-10-16 NOTE — Discharge Instructions (Signed)
 Please seek medical attention for any high fevers, chest pain, shortness of breath, change in behavior, persistent vomiting, bloody stool or any other new or concerning symptoms.

## 2023-10-16 NOTE — ED Triage Notes (Signed)
 Patient states he is Covid +; today his heart rate elevated to 130s while walking.

## 2023-10-16 NOTE — Telephone Encounter (Signed)
 Patient c/o Palpitations:  STAT if patient reporting lightheadedness, shortness of breath, or chest pain  How long have you had palpitations/irregular HR/ Afib? Are you having the symptoms now?   Yes  Are you currently experiencing lightheadedness, SOB or CP?   No  Do you have a history of afib (atrial fibrillation) or irregular heart rhythm?   No  Have you checked your BP or HR? (document readings if available):   HR 97  BP 140/71   (while on phone)  HR 98 when sitting and 130 when walking around  Are you experiencing any other symptoms?   Feels weak all over  Patient stated these symptoms started this morning.  Patient stated he tested positive for COVID on Friday, 2/28.  Patient noted he is currently wearing a heart monitor.

## 2023-10-17 ENCOUNTER — Encounter: Payer: Self-pay | Admitting: Pulmonary Disease

## 2023-10-17 DIAGNOSIS — J209 Acute bronchitis, unspecified: Secondary | ICD-10-CM

## 2023-10-17 DIAGNOSIS — U071 COVID-19: Secondary | ICD-10-CM

## 2023-10-17 MED ORDER — AZITHROMYCIN 250 MG PO TABS: ORAL_TABLET | ORAL | 0 refills | Status: AC

## 2023-10-17 MED ORDER — MOLNUPIRAVIR 200 MG PO CAPS: 4.0000 | ORAL_CAPSULE | Freq: Two times a day (BID) | ORAL | 0 refills | Status: AC

## 2023-10-17 NOTE — Telephone Encounter (Signed)
 Sent prescriptions for molnupiravir and azithromycin.

## 2023-10-24 ENCOUNTER — Ambulatory Visit
Admission: RE | Admit: 2023-10-24 | Discharge: 2023-10-24 | Disposition: A | Discharge status: Home / Self Care | Source: Ambulatory Visit | Attending: Pulmonary Disease | Admitting: Pulmonary Disease

## 2023-10-24 ENCOUNTER — Other Ambulatory Visit: Payer: Self-pay

## 2023-10-24 ENCOUNTER — Ambulatory Visit: Payer: Self-pay | Admitting: Pulmonary Disease

## 2023-10-24 ENCOUNTER — Encounter: Payer: Self-pay | Admitting: Internal Medicine

## 2023-10-24 ENCOUNTER — Ambulatory Visit: Admitting: Pulmonary Disease

## 2023-10-24 VITALS — BP 124/60 | HR 76 | Temp 97.6°F | Ht 72.0 in | Wt 208.0 lb

## 2023-10-24 DIAGNOSIS — U071 COVID-19: Secondary | ICD-10-CM | POA: Insufficient documentation

## 2023-10-24 DIAGNOSIS — Z8616 Personal history of COVID-19: Secondary | ICD-10-CM | POA: Diagnosis not present

## 2023-10-24 DIAGNOSIS — G4733 Obstructive sleep apnea (adult) (pediatric): Secondary | ICD-10-CM

## 2023-10-24 DIAGNOSIS — I10 Essential (primary) hypertension: Secondary | ICD-10-CM

## 2023-10-24 DIAGNOSIS — J4489 Other specified chronic obstructive pulmonary disease: Secondary | ICD-10-CM

## 2023-10-24 DIAGNOSIS — J209 Acute bronchitis, unspecified: Secondary | ICD-10-CM

## 2023-10-24 DIAGNOSIS — J309 Allergic rhinitis, unspecified: Secondary | ICD-10-CM

## 2023-10-24 MED ORDER — AZITHROMYCIN 250 MG PO TABS: ORAL_TABLET | ORAL | 0 refills | Status: AC

## 2023-10-24 MED ORDER — PREDNISONE 20 MG PO TABS
20.0000 mg | ORAL_TABLET | Freq: Every day | ORAL | 0 refills | Status: AC
Start: 2023-10-24 — End: 2023-10-29

## 2023-10-24 MED ORDER — AZITHROMYCIN 250 MG PO TABS
ORAL_TABLET | ORAL | 0 refills | Status: DC
  Filled 2023-10-24: qty 6, 5d supply, fill #0

## 2023-10-24 MED ORDER — PREDNISONE 20 MG PO TABS
20.0000 mg | ORAL_TABLET | Freq: Every day | ORAL | 0 refills | Status: DC
  Filled 2023-10-24: qty 5, 5d supply, fill #0

## 2023-10-24 NOTE — Telephone Encounter (Signed)
 Patient saw Dr. Jayme Cloud today.  Nothing further needed.

## 2023-10-24 NOTE — Patient Instructions (Signed)
 VISIT SUMMARY:  Today, we discussed your persistent cough following a COVID-19 infection, along with your history of bronchitis, heart issues, nasal congestion, and obstructive sleep apnea. We reviewed your current medications and made some adjustments to help manage your symptoms and improve your overall health.  YOUR PLAN:  -POST-COVID-19 BRONCHITIS: Post-COVID-19 bronchitis is an inflammation of the bronchial tubes following a COVID-19 infection, causing a persistent cough and yellow sputum. We will continue your current antibiotic, azithromycin, and add a short course of prednisone to help reduce inflammation. You should also use your nebulizer with albuterol 2-3 times daily and take DayQuil during the day and NyQuil at night to manage your cough. A chest x-ray will be done to rule out pneumonia. An extra Z-Pak (azithromycin) has been provided for future use if needed.  -ALLERGIC RHINITIS: Allergic rhinitis is an allergic reaction that causes nasal congestion and sinus issues. Continue using Dupixent as prescribed and ensure proper administration technique to reduce injection site reactions.  -OBSTRUCTIVE SLEEP APNEA: Obstructive sleep apnea is a condition where breathing repeatedly stops and starts during sleep. Addressing your nasal congestion will help improve the effectiveness of your CPAP machine.  -HYPERTENSION: Hypertension is high blood pressure. Continue taking losartan as prescribed and monitor your blood pressure regularly to ensure it remains under control.  INSTRUCTIONS:  Please follow up with a chest x-ray to rule out pneumonia. Continue taking your medications as prescribed and monitor your blood pressure regularly. If your symptoms worsen or you have any concerns, please contact our office.

## 2023-10-24 NOTE — Telephone Encounter (Signed)
 Chief Complaint: SOB Symptoms: cough Frequency: 2 weeks Pertinent Negatives: Patient denies fever, CP Disposition: [] ED /[] Urgent Care (no appt availability in office) / [x] Appointment(In office/virtual)/ []  [facility]/ [] Home Care/ [] Refused Recommended Disposition /[] [facility]/ []  Follow-up with PCP Additional Notes: Pt c/o of SOB, coughing. Reports tested positive for COVID 2 weeks ago and is on 2nd round of abx. Reports he is on day 2 of z-pack and has finished augmentin. Reports has concern that sx have evolved to PNA. Denies fever, CP. Endorses coughing and chest tightness and is taking respiratory tx as prescribed. Triager reinforced taking albuterol as needed, and to take a dose now, if not taken in the last 6 hrs. Pulmonology [facility] notified of disposition, patient transferred for scheduling. Patient verbalized understanding and to call back/911 with worsening symptoms.     Reason for Disposition  [1] Longstanding difficulty breathing (e.g., CHF, COPD, emphysema) AND [2] WORSE than normal  Answer Assessment - Initial Assessment Questions E2C2 Pulmonary Triage - Initial Assessment Questions "Chief Complaint (e.g., cough, sob, wheezing, fever, chills, sweat or additional symptoms) *Go to specific symptom protocol after initial questions. Recently dx with COVID/bronchitis - reports taking z-pack, currently on day 2, finished augmentin 2 days prior COVID + March 1 - concern for PNA  "How long have symptoms been present?" March 1  Have you tested for COVID or Flu? Note: If not, ask patient if a home test can be taken. If so, instruct patient to call back for positive results. Yes  MEDICINES:   "Have you used any OTC meds to help with symptoms?" Yes If yes, ask "What medications?" Mucinex & delsym - some relief  "Have you used your inhalers/maintenance medication?" Yes If yes, "What medications?" Albuterol took once today - reports helps Trelegy -  once a day, one puff  If inhaler, ask "How many puffs and how often?" Note: Review instructions on medication in the chart. See above  OXYGEN: "Do you wear supplemental oxygen?" No If yes, "How many liters are you supposed to use?" [name]  "Do you monitor your oxygen levels?" Yes If yes, "What is your reading (oxygen level) today?" 95%  "What is your usual oxygen saturation reading?"  (Note: Pulmonary O2 sats should be 90% or greater) 93-97%   1. RESPIRATORY STATUS: "Describe your breathing?" (e.g., wheezing, shortness of breath, unable to speak, severe coughing)      Productive Coughing - yellow, tight in the chest from chest 2. ONSET: "When did this breathing problem begin?"      See above 3. PATTERN "Does the difficult breathing come and go, or has it been constant since it started?"      constant 4. SEVERITY: "How bad is your breathing?" (e.g., mild, moderate, severe)    - MILD: No SOB at rest, mild SOB with walking, speaks normally in sentences, can lie down, no retractions, pulse < 100.    - MODERATE: SOB at rest, SOB with minimal exertion and prefers to sit, cannot lie down flat, speaks in phrases, mild retractions, audible wheezing, pulse 100-120.    - SEVERE: Very SOB at rest, speaks in single words, struggling to breathe, sitting hunched forward, retractions, pulse > 120      moderate 5. RECURRENT SYMPTOM: "Have you had difficulty breathing before?" If Yes, ask: "When was the last time?" and "What happened that time?"      Breathing tx, abx 6. CARDIAC HISTORY: "Do you have any history of heart disease?" (e.g., heart attack,  angina, bypass surgery, angioplasty)      CHF 7. LUNG HISTORY: "Do you have any history of lung disease?"  (e.g., pulmonary embolus, asthma, emphysema)     COPD, asthma 8. CAUSE: "What do you think is causing the breathing problem?"      COVID 9. OTHER SYMPTOMS: "Do you have any other symptoms? (e.g., dizziness, runny nose, cough, chest pain, fever)      Afebrile Denies dizziness Chest tightness  Protocols used: Breathing Difficulty-A-AH

## 2023-10-24 NOTE — Progress Notes (Signed)
 Subjective:    Patient ID: [name], male    DOB: 11-05-1940, 83 y.o.   MRN: [id number]  Patient Care Team: Eustaquio Boyden, MD as PCP - General (Family Medicine) Vilinda Flake, [facility] (Inactive) as Pharmacist (Pharmacist) Antonieta Iba, MD as Consulting Physician (Cardiology) Salena Saner, MD as Consulting Physician (Pulmonary Disease) Iran Ouch, MD as Consulting Physician (Cardiology)  Chief Complaint  Patient presents with   Acute Visit    Cough, shortness of breath and wheezing.     BACKGROUND/INTERVAL: [name] is an 83 year old former smoker (25 PY) who presents for an acute visit in the setting of COVID-19 infection on 14 October 2023.  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 Patient was last seen on 20 September 2022.  Is on Dupixent having started on 29 August 2023.   HPI Discussed the use of AI scribe software for clinical note transcription with the patient, who gave verbal consent to proceed.  History of Present Illness   The patient presents with a persistent cough following COVID-19 infection. He is accompanied by his wife.  He has been experiencing a persistent cough that began around the fourth or fifth day after testing positive for COVID-19. The cough is severe at times, causing chest discomfort similar to previous experiences with bronchitis and pneumonia. It produces yellow sputum, which was previously green. He has been using Delsym for relief, but continues to have coughing fits, especially at night.  He has a history of bronchitis, previously treated with antibiotics by Dr. Sharen Hones. He initially used these antibiotics when the current symptoms began, but found them ineffective. He started a new antibiotic regimen (azithromycin) yesterday, but feels his condition is worsening.  He has a history of heart issues and noted an elevated heart rate last  week, attributed to [place]. He was switched back to losartan and is currently being monitored for medication adjustments. He uses a nebulizer at home with albuterol solution, which he uses two to three times a day.  He uses a CPAP machine but has difficulty due to nasal congestion. He has been taking Dupixent for asthma COPD overlap, administered every two weeks, and reports improvement in bruising from the injections.  He is scheduled for a shoulder replacement surgery on May 22nd and is concerned about the use of steroids potentially delaying the surgery.     Review of Systems A 10 point review of systems was performed and it is as noted above otherwise negative.   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   OSA (obstructive sleep apnea)    Chronic heart failure with preserved ejection fraction (HFpEF) (HCC)    Right leg pain 06/22/2021   Nocturnal hypoxia 06/11/2021   CKD (chronic kidney disease) stage 3, GFR 30-59 ml/min (HCC) 02/24/2021   Peripheral neuropathy 02/23/2021   Spondylolisthesis of lumbar region 07/07/2020   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   Coccydynia 09/20/2019   Nasal sinus congestion 07/19/2019  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   Anxiety 06/04/2019   Chronic right shoulder pain 09/10/2018   Cervical stenosis of spinal canal 06/05/2017   Pedal edema 06/05/2017   Health maintenance examination 12/06/2016   Lumbar stenosis with neurogenic claudication  07/13/2016   Overweight (BMI 25.0-29.9) 07/04/2016   Low vitamin B12 level 01/01/2016   Exertional dyspnea 07/07/2015   Unstable angina (HCC)    Medicare annual wellness visit, subsequent 07/03/2015   Advanced care planning/counseling discussion 07/03/2015   Type 2 diabetes mellitus with other specified complication (HCC) 05/04/2015   Pleuritic chest pain 05/04/2015   Ex-smoker 05/04/2015   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   Spermatocele 04/02/2013   Syncopal vertigo 11/04/2010   Lumbar disc disease with radiculopathy 11/04/2010   Coronary artery disease involving native coronary artery of native heart with unstable angina pectoris (HCC) 08/11/2009   PAD (peripheral artery disease) (HCC) 08/11/2009   DUPUYTREN'S CONTRACTURE, 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   Carotid stenosis 07/09/2008   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   Benign prostatic hyperplasia with urinary obstruction 09/06/2007   Essential hypertension 04/30/2007   GERD 04/30/2007   Hyperlipidemia associated with type 2 diabetes mellitus (HCC) 04/26/2007   OA (osteoarthritis) of knee 04/26/2007   DVT, HX OF 04/26/2007    Social History   Tobacco Use   Smoking status: Former    Current packs/day: 0.00    Average packs/day: 1 pack/day for 25.0 years (25.0 ttl pk-yrs)    Types: Cigarettes    Start date: 08/16/1963    Quit date: 08/15/1988    Years since quitting: 35.2    Passive exposure: Past   Smokeless tobacco: Never  Substance Use Topics   Alcohol use: No    Alcohol/week: 0.0 standard drinks of alcohol    Allergies  Allergen Reactions   Vioxx [Rofecoxib] Other (See Comments)    Hemorrhage    Spiriva Respimat [Tiotropium Bromide Monohydrate] Other (See Comments)    Elevated bp   Contrast Media [Iodinated Contrast Media] Itching and Rash    Delayed reaction post abdominal aortagram.    Morphine Nausea Only and Other (See Comments)    Irritability     Current Meds  Medication Sig   albuterol (PROVENTIL) (2.5  MG/3ML) 0.083% nebulizer solution Take 3 mLs (2.5 mg total) by nebulization every 6 (six) hours as needed for wheezing or shortness of breath.   albuterol (VENTOLIN HFA) 108 (90 Base) MCG/ACT inhaler TAKE 2 PUFFS INTO LUNGS EVERY 6 HOURS ASNEEDED FOR WHEEZING OR SHORTNESS OF BREATH   aspirin EC 81 MG EC tablet Take 1 tablet (81 mg total) by mouth daily.   atorvastatin (LIPITOR) 20 MG tablet TAKE 1 TABLET BY MOUTH DAILY   carvedilol (COREG) 3.125 MG tablet TAKE 1 TABLET BY MOUTH TWICE DAILY   Cholecalciferol (VITAMIN D3) 25 MCG (1000 UT) CAPS Take 1 capsule (1,000 Units total) by mouth 2 (two) times a week.   clopidogrel (PLAVIX) 75 MG tablet Take 1 tablet (75 mg total) by mouth daily with breakfast.   Cyanocobalamin (VITAMIN B-12 ER PO) Take 1 capsule by mouth daily.   cyclobenzaprine (FLEXERIL) 5 MG tablet Take 1 tablet (5 mg total) by mouth 3 (three) times daily as needed for muscle spasms.   docusate sodium (COLACE) 100 MG capsule Take 1 capsule (100 mg total) by mouth daily as needed for mild constipation.   Dupilumab (DUPIXENT) 300 MG/2ML SOAJ Inject 300 mg into the skin every 14 (fourteen) days. **patient completed loading dose in clinic**   ezetimibe (  ZETIA) 10 MG tablet TAKE 1 TABLET BY MOUTH DAILY   famotidine (PEPCID) 20 MG tablet Take 20 mg by mouth daily.   fluticasone (FLONASE) 50 MCG/ACT nasal spray Place 1 spray into both nostrils daily as needed for allergies or rhinitis.   Fluticasone-Umeclidin-Vilant (TRELEGY ELLIPTA) 200-62.5-25 MCG/ACT AEPB Inhale 1 puff into the lungs daily.   furosemide (LASIX) 40 MG tablet Take 0.5 tablets (20 mg total) by mouth daily. Take 1 tablet (40 mg) daily as need for weight greater then 208.   gabapentin (NEURONTIN) 600 MG tablet Take 1 tablet (600 mg total) by mouth 3 (three) times daily.   HYDROcodone-acetaminophen (NORCO/VICODIN) 5-325 MG tablet Take 1 tablet by mouth 3 (three) times daily as needed for moderate pain (pain score 4-6).    hydrocortisone 2.5 % cream Apply topically 2 (two) times daily as needed (Rash).   isosorbide mononitrate (IMDUR) 30 MG 24 hr tablet Take 1 tablet (30 mg total) by mouth daily.   losartan (COZAAR) 25 MG tablet Take 1 tablet (25 mg total) by mouth daily.   montelukast (SINGULAIR) 10 MG tablet TAKE 1 TABLET BY MOUTH DAILY   mupirocin ointment (BACTROBAN) 2 % Apply 1 Application topically 2 (two) times daily.   nitroGLYCERIN (NITROSTAT) 0.4 MG SL tablet Place 1 tablet (0.4 mg total) under the tongue every 5 (five) minutes as needed for chest pain.   polyethylene glycol (MIRALAX / GLYCOLAX) 17 g packet Take 17 g by mouth daily as needed for mild constipation.    ranolazine (RANEXA) 500 MG 12 hr tablet TAKE ONE (1) TABLET BY MOUTH TWO TIMES PER DAY   tamsulosin (FLOMAX) 0.4 MG CAPS capsule TAKE 1 CAPSULE BY MOUTH AT BEDTIME    Immunization History  Administered Date(s) Administered   Fluad Quad(high Dose 65+) 04/17/2019, 04/15/2022, 05/08/2023   Influenza Whole 08/15/2000, 07/04/2007, 05/09/2008, 05/31/2010   Influenza, High Dose Seasonal PF 05/15/2013, 06/25/2015, 07/05/2021, 05/08/2023   Influenza,inj,Quad PF,6+ Mos 04/17/2014, 05/18/2016, 06/07/2018   Influenza-Unspecified 05/22/2017, 05/28/2020, 08/05/2022   PFIZER(Purple Top)SARS-COV-2 Vaccination 09/07/2019, 09/28/2019, 05/12/2020, 01/19/2021   PNEUMOCOCCAL CONJUGATE-20 09/09/2023   Pneumococcal Conjugate-13 02/09/2015   Pneumococcal Polysaccharide-23 08/15/2000, 10/29/2008   Respiratory Syncytial Virus Vaccine,Recomb Aduvanted(Arexvy) 10/10/2022   Td 08/15/2005   Tdap 02/10/2018   Zoster Recombinant(Shingrix) 04/05/2017, 07/25/2017   Zoster, Live 02/04/2009        Objective:     BP 124/60 (BP Location: Left Arm, Patient Position: Sitting, Cuff Size: Normal)   Pulse 76   Temp 97.6 F (36.4 C) (Temporal)   Ht 6' (1.829 m)   Wt 208 lb (94.3 kg)   SpO2 97%   BMI 28.21 kg/m   SpO2: 97 %  GENERAL: This is a well-developed,  overweight gentleman, acute on chronically ill-appearing, awake, alert, no acute distress.  Fully ambulatory. HEAD: Normocephalic, atraumatic.  EYES: Pupils equal, round, reactive to light.  No scleral icterus.  MOUTH: Poor dentition, oral mucosa moist.  No thrush. NECK: Supple. No thyromegaly. Trachea midline. No JVD.  No adenopathy.  Anterior cervical fusion incision, healed. PULMONARY: Good air entry bilaterally, scattered rhonchi throughout, no wheezes.  CARDIOVASCULAR: S1 and S2. Regular rate and rhythm.  Grade 1/6 systolic ejection murmur left sternal border. GASTROINTESTINAL: Protuberant abdomen, soft, no tenderness.   MUSCULOSKELETAL: No joint deformity, no clubbing, no edema.  NEUROLOGIC: No overt focal deficits noted.  Speech is fluent.   SKIN: Intact,warm,dry.  Limited exam shows no rashes.  He has multiple purpura and ecchymoses in the upper extremities. PSYCH: Mood and  behavior normal.    Chest x-ray obtained today, independently reviewed, no overt acute process noted, formal interpretation by radiology pending:    Assessment & Plan:     ICD-10-CM   1. COVID-19 virus infection  U07.1 DG Chest 2 View      Orders Placed This Encounter  Procedures   DG Chest 2 View    Standing Status:   Future    Number of Occurrences:   1    Expiration Date:   10/23/2024    Reason for Exam (SYMPTOM  OR DIAGNOSIS REQUIRED):   R/O Pneumonia, Recent Covid 19 infection    Preferred imaging location?:   [facility]    Meds ordered this encounter  Medications   predniSONE (DELTASONE) 20 MG tablet    Sig: Take 1 tablet (20 mg total) by mouth daily with breakfast for 5 days.    Dispense:  5 tablet    Refill:  0   azithromycin (ZITHROMAX) 250 MG tablet    Sig: Take 2 tablets (500 mg) on  Day 1,  followed by 1 tablet (250 mg) once daily on Days 2 through 5.    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 The cough is associated with yellow sputum, suggesting bronchitis rather than pneumonia. He experiences intermittent coughing fits and nocturnal wheezing. Current treatment with azithromycin was initiated after a previous antibiotic was ineffective. A short course of prednisone is considered to alleviate symptoms, with minimal risk of delaying a scheduled shoulder replacement due to its short systemic nature. - Order chest x-ray to rule out pneumonia -no pneumonia seen - Continue azithromycin as prescribed - Prescribe prednisone 20 mg for 5 days - Advise use of nebulizer with albuterol 2-3 times daily - Recommend DayQuil during the day and NyQuil at night for cough management - Provide an extra Z-Pak (azithromycin) for future use if needed  Allergic rhinitis [name] reports nasal congestion and sinus issues, exacerbated by his current respiratory condition. He is on Dupixent for allergy management, administered biweekly. Proper administration technique is advised to reduce injection site reactions. - Continue Dupixent as prescribed - Advise on proper administration technique for Dupixent  Obstructive sleep apnea [name] has obstructive sleep apnea and uses a CPAP machine. Nasal congestion has impaired effective CPAP use. - Address nasal congestion to improve CPAP use  Hypertension [name] hypertension management was adjusted due to an adverse reaction to [facility], which elevated his blood pressure and heart rate. He has been switched back to losartan, with ongoing blood pressure monitoring. - Continue losartan as prescribed - Monitor blood pressure regularly       Advised if symptoms do not improve or worsen, to please contact office for sooner follow up or seek emergency care.    I spent 40 minutes of dedicated to the care of this patient on the date of this encounter to include pre-visit review of records,  face-to-face time with the patient discussing conditions above, post visit ordering of testing, clinical documentation with the electronic health record, making appropriate referrals as documented, and communicating necessary findings to members of the patients care team.     C. Danice Goltz, MD Advanced Bronchoscopy PCCM [facility]    *This note was generated using voice recognition software/Dragon and/or AI transcription program.  Despite best efforts to proofread, errors can occur which  can change the meaning. Any transcriptional errors that result from this process are unintentional and may not be fully corrected at the time of dictation.

## 2023-10-26 DIAGNOSIS — Z79899 Other long term (current) drug therapy: Secondary | ICD-10-CM

## 2023-10-31 MED ORDER — SPIRONOLACTONE 25 MG PO TABS: 12.5000 mg | ORAL_TABLET | Freq: Every day | ORAL | 3 refills | Status: DC

## 2023-11-01 ENCOUNTER — Other Ambulatory Visit: Payer: Self-pay | Admitting: Family Medicine

## 2023-11-01 ENCOUNTER — Other Ambulatory Visit: Payer: Self-pay | Admitting: Pulmonary Disease

## 2023-11-01 DIAGNOSIS — J4489 Other specified chronic obstructive pulmonary disease: Secondary | ICD-10-CM

## 2023-11-03 ENCOUNTER — Other Ambulatory Visit: Payer: Self-pay

## 2023-11-03 DIAGNOSIS — H43813 Vitreous degeneration, bilateral: Secondary | ICD-10-CM | POA: Diagnosis not present

## 2023-11-03 DIAGNOSIS — H04123 Dry eye syndrome of bilateral lacrimal glands: Secondary | ICD-10-CM | POA: Diagnosis not present

## 2023-11-03 DIAGNOSIS — H34232 Retinal artery branch occlusion, left eye: Secondary | ICD-10-CM | POA: Diagnosis not present

## 2023-11-03 DIAGNOSIS — Z79899 Other long term (current) drug therapy: Secondary | ICD-10-CM | POA: Diagnosis not present

## 2023-11-03 IMAGING — DX DG CHEST 1V
1 series · 1 of 1 positions shown · non-contrast
Comparison: Chest x-ray 04/20/2021

CLINICAL DATA: Pt states that his breathing has worsened over the
past week. Pt reports chest pain and sinus pressure.

EXAM:
CHEST  1 VIEW

[chest ap]
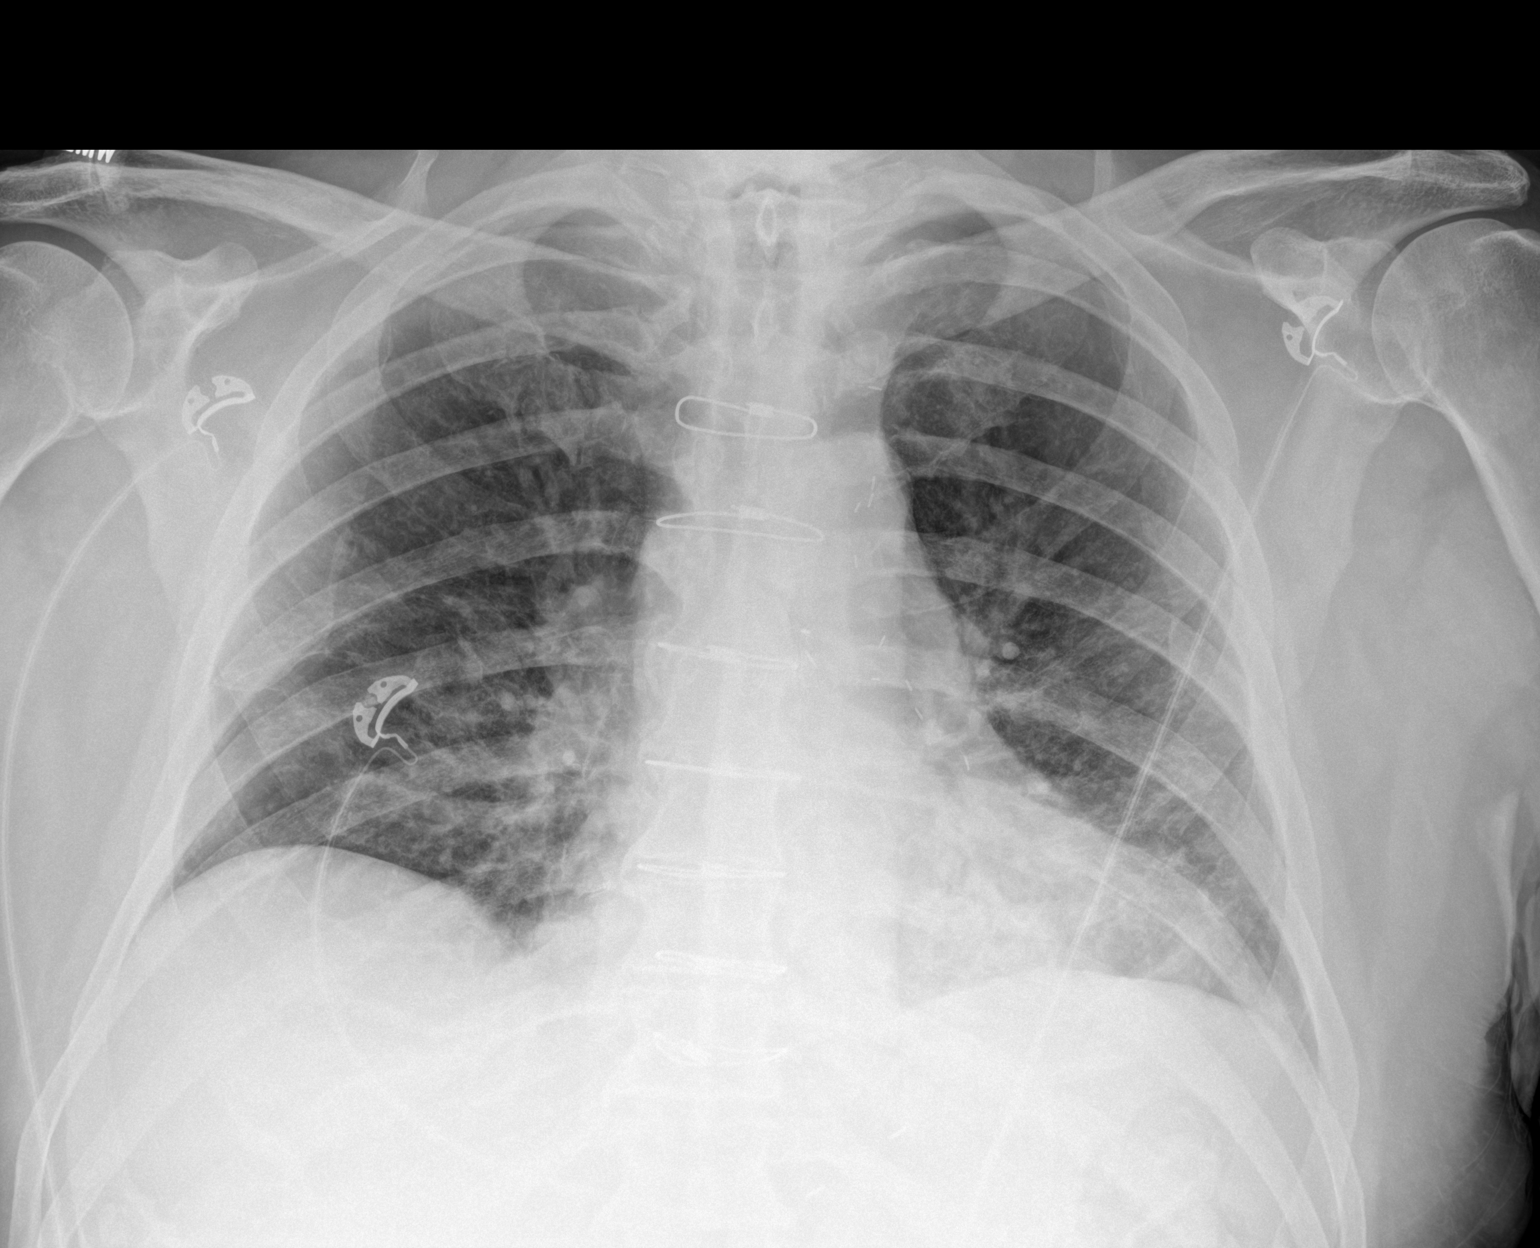

[1 of 1 positions shown; findings below may reference images not displayed]

FINDINGS: The heart and mediastinal contours are unchanged. Cardiac surgical
changes overlie the mediastinum. Slightly more prominent hilar
vascularity.

No focal consolidation. Slightly increased interstitial markings. No
pleural effusion. No pneumothorax.

No acute osseous abnormality.
IMPRESSION: Mild pulmonary edema.

## 2023-11-04 DIAGNOSIS — R002 Palpitations: Secondary | ICD-10-CM | POA: Diagnosis not present

## 2023-11-04 LAB — BASIC METABOLIC PANEL
BUN/Creatinine Ratio: 19 (ref 10–24)
BUN: 30 mg/dL — ABNORMAL HIGH (ref 8–27)
CO2: 23 mmol/L (ref 20–29)
Calcium: 9.2 mg/dL (ref 8.6–10.2)
Chloride: 101 mmol/L (ref 96–106)
Creatinine, Ser: 1.57 mg/dL — ABNORMAL HIGH (ref 0.76–1.27)
Glucose: 97 mg/dL (ref 70–99)
Potassium: 4.6 mmol/L (ref 3.5–5.2)
Sodium: 139 mmol/L (ref 134–144)
eGFR: 44 mL/min/{1.73_m2} — ABNORMAL LOW (ref >59)

## 2023-11-09 DIAGNOSIS — G4733 Obstructive sleep apnea (adult) (pediatric): Secondary | ICD-10-CM | POA: Diagnosis not present

## 2023-11-09 DIAGNOSIS — I10 Essential (primary) hypertension: Secondary | ICD-10-CM | POA: Diagnosis not present

## 2023-11-10 DIAGNOSIS — R002 Palpitations: Secondary | ICD-10-CM | POA: Diagnosis not present

## 2023-11-10 NOTE — Progress Notes (Signed)
 Average heart rate of 73 bpm, abnormal heart rhythms like atrial fibrillation or atrial flutter noted.  Rare early beats noted.  No triggered events.  Overall benign monitor.

## 2023-11-16 ENCOUNTER — Encounter: Payer: Self-pay | Admitting: Cardiology

## 2023-11-16 ENCOUNTER — Ambulatory Visit: Payer: HMO | Attending: Cardiology | Admitting: Cardiology

## 2023-11-16 VITALS — BP 128/56 | HR 61 | Ht 72.0 in | Wt 206.8 lb

## 2023-11-16 DIAGNOSIS — J449 Chronic obstructive pulmonary disease, unspecified: Secondary | ICD-10-CM

## 2023-11-16 DIAGNOSIS — I739 Peripheral vascular disease, unspecified: Secondary | ICD-10-CM

## 2023-11-16 DIAGNOSIS — I2510 Atherosclerotic heart disease of native coronary artery without angina pectoris: Secondary | ICD-10-CM

## 2023-11-16 DIAGNOSIS — I6523 Occlusion and stenosis of bilateral carotid arteries: Secondary | ICD-10-CM

## 2023-11-16 DIAGNOSIS — I10 Essential (primary) hypertension: Secondary | ICD-10-CM | POA: Diagnosis not present

## 2023-11-16 DIAGNOSIS — I5020 Unspecified systolic (congestive) heart failure: Secondary | ICD-10-CM

## 2023-11-16 DIAGNOSIS — E785 Hyperlipidemia, unspecified: Secondary | ICD-10-CM

## 2023-11-16 DIAGNOSIS — R002 Palpitations: Secondary | ICD-10-CM

## 2023-11-16 DIAGNOSIS — I255 Ischemic cardiomyopathy: Secondary | ICD-10-CM | POA: Diagnosis not present

## 2023-11-16 MED ORDER — FUROSEMIDE 40 MG PO TABS: 20.0000 mg | ORAL_TABLET | Freq: Every day | ORAL | 3 refills | Status: AC | PRN

## 2023-11-16 NOTE — Patient Instructions (Signed)
 Medication Instructions:   Take Lasix 20 MG daily as needed.   *If you need a refill on your cardiac medications before your next appointment, please call your pharmacy*  Lab Work: None ordered.  If you have labs (blood work) drawn today and your tests are completely normal, you will receive your results only by: MyChart Message (if you have MyChart) OR A paper copy in the mail If you have any lab test that is abnormal or we need to change your treatment, we will call you to review the results.  Testing/Procedures: None ordered.   Follow-Up: At [facility], you and your health needs are our priority.  As part of our continuing mission to provide you with exceptional heart care, our providers are all part of one team.  This team includes your primary Cardiologist (physician) and Advanced Practice Providers or APPs (Physician Assistants and Nurse Practitioners) who all work together to provide you with the care you need, when you need it.   Provider:   You may see Dr. Mariah Milling or Dr. Kirke Corin or one of the following Advanced Practice Providers on your designated Care Team:   Nicolasa Ducking, NP Ames Dura, PA-C Eula Listen, PA-C Cadence [place], PA-C Charlsie Quest, NP Carlos Levering, NP    We recommend signing up for the patient portal called "MyChart".  Sign up information is provided on this After Visit Summary.  MyChart is used to connect with patients for Virtual Visits (Telemedicine).  Patients are able to view lab/test results, encounter notes, upcoming appointments, etc.  Non-urgent messages can be sent to your provider as well.   To learn more about what you can do with MyChart, go to [email or web address].

## 2023-11-16 NOTE — Progress Notes (Signed)
 Cardiology Office Note:  .   Date:  11/16/2023  ID:  Ardelia Mems, DOB [birth year], MRN [id number] PCP: Eustaquio Boyden, MD  [facility] Providers Cardiologist:  None    History of Present Illness: .   EAN GETTEL is a 83 y.o. 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   Mr. Tedesco underwent two-vessel CABG in 1990.  LHC in 2002 with successful PCI/BMS to the LCx.  LHC from 06/2015 showed 2 of 2 patent grafts with a patent LCx stent.  He is known to have small vessel and diffuse RCA disease which has been medically managed with long-acting nitrate.  In 08/2018, he underwent lower extremity arterial angiography, in the setting of ongoing lower extremity pain and abnormal duplex.  This showed diffuse and calcified bilateral SFA disease with three-vessel runoff below the knee bilaterally.  Medical therapy was recommended.  He was admitted to the hospital in 05/2019 with COVID.  Echo at that time demonstrated new LV systolic dysfunction with an EF of 35 to 40%.  Follow-up echo in early 2021 showed improvement in LV systolic function with an EF of 50 to 55%.  He had a low risk stress test in 05/2020 which was performed prior to back surgery.  In 03/2021 he complained of chest tightness and dyspnea.  Subsequent echo demonstrated an EF of 50 to 55% with normal RV systolic function and a PASP of 44.7 mmHg.  He subsequently underwent Lexiscan MPI in 04/2021, in the setting  of admission for unstable angina and dyspnea, which was low risk and without evidence of ischemia.  PFTs in 03/2021 showed mild to moderate obstructive lung disease.  Carotid artery ultrasound in 11/2022 showed less than 50% bilateral ICA stenosis.  He had been treated for COPD exacerbation/bronchitis with antibiotics.  During this time, he underwent echo on 11/22/2022, which demonstrated an EF of 40 to 45%, global hypokinesis, grade 1 diastolic dysfunction, normal RV systolic function and ventricular cavity size, mildly elevated PASP estimated at 39.8 mmHg, mildly dilated left atrium, mild mitral regurgitation, mild aortic stenosis with a mean gradient of 12 mmHg, and an estimated right atrial pressure of 3 mmHg.  Lexiscan MPI on 12/27/2022 was high risk with a moderate in size, severe, partially reversible basal and mid inferior/inferolateral defect consistent with ischemia and an element of scar.  LVEF 25 to 35%.  CT imaging showed post CABG findings with coronary artery calcification and aortic atherosclerosis.  A 1.4 cm hypodensity was noted in the liver similar to CT from 09/2021.  When compared to study from 2022, the inferior/inferolateral defect and reduced LVEF were new.  Given abnormal findings, it was recommended the patient increase Imdur to 30 mg twice daily and appointment to be scheduled for today.  Echocardiogram ordered in April 2024 for shortness of breath and bronchitis showed newly reduced LVEF of 40-45% with G1 DD.  Further evaluation with Myoview Eugenie Birks was ordered and completed 01/10/2023 which was high risk is showing moderate in size, severity, partially reversible basal and mid inferior, inferior lateral defect consistent with ischemia  and element of scar.  He underwent right and left heart catheterization 01/03/2023 that showed left dominant coronary arteries with severe three-vessel CAD.  Patent grafts including LIMA to LAD, saphenous to the first and second diagonals, patent mid left  circumflex stent.  He was noted to have subacute total occlusion of the mid to distal left circumflex with collaterals from the LAD.  Right heart catheter showed mildly elevated RA pressures, moderate pulmonary hypertension, and moderately to severely elevated wedge pressures with normal cardiac output.  He was treated with DES in mid/distal left circumflex.  He was started on DAPT with aspirin and Plavix for minimum of 6 months. He was last seen in clinic 08/11/2023. Since his stent placement he was scheduled for a limited echocardiogram to reassess his LV function. GDMT was previously limited in the setting of renal dysfunction however his most recent GFR being 52. His EF now recovered and completed to switch ARB to [facility]. He was scheduled for repeat BMP. He was continued on carvedilol 3.25 mg twice daily, losartan 25 mg daily, furosemide 20 mg daily. Limited echocardiogram completed 09/05/2023 revealed an LVEF of 40-45%, G1 DD, and mild MR. He was recommended that he start Entresto 24/26 mg twice daily and discontinue his losartan therapy. Patient originally had declined starting the medication until it been approved by Dr. Mariah Milling.    He was last seen in clinic 10/06/2023 with several concerns that he notices recently started on Entresto after his last echocardiogram.  He also started worsening palpitations with varying heart rate.  Due to side effects of Entresto fatigue and palpitations he stopped taking Entresto and restarted his self on his prior losartan.  He has been placed on a ZIO XT monitor for 7 days.  GDMT continue to be escalated.  He returns to clinic today stating that overall he has been doing well.  He has several questions/complaints today.  He has increased amount of bruising and was previously advised that he would need to stay on aspirin and clopidogrel for minimum of 6 months but longer would be better.  Did discuss him coming off of one of his dual antiplatelet therapy medications being  on monotherapy is an upcoming appointment with Dr. Renato Gails and will continue discussed that then.  He is recently getting over a COVID infection.  Continues to complain about increased urination since started on spironolactone and states that he has been to stop this medication as he had previously stopped Entresto on his own and placed to self back on losartan.  He denies any chest pain, shortness of breath, peripheral edema or palpitations.  States that he has been compliant with his medications but is not happy with some of the side effects.  Stated that he did have 1 visit to the emergency department when he was suffering from COVID infection as he was tachycardic with heart rate in the 130s he was told he was dehydrated and received IV fluids.  ROS: 10 point review of system has been reviewed and negative except ones been listed in the HPI  Studies Reviewed: Marland Kitchen   EKG Interpretation Date/Time:  Thursday November 16 2023 11:28:30 EDT Ventricular Rate:  61 PR Interval:  140 QRS Duration:  116 QT Interval:  412 QTC Calculation: 414 R Axis:   -44  Text Interpretation: Normal sinus rhythm Left axis deviation Incomplete left bundle branch block ST & T wave abnormality, consider lateral ischemia When compared with ECG of 16-Oct-2023 10:53, T wave inversion now evident in Lateral  leads Confirmed by Charlsie Quest ([id number]) on 11/16/2023 11:36:22 AM    Event Monitor (Zio) 11/07/23 Normal sinus rhythm Patient had a min HR of 44 bpm, max HR of 131 bpm, and avg HR of 73 bpm.  Bundle Branch Block/IVCD was present.    Isolated SVEs were rare (<1.0%), SVE Triplets were rare (<1.0%), and no SVE Couplets were present.  Isolated VEs were rare (<1.0%, 3201), VE Couplets were rare (<1.0%, 45), and VE Triplets were rare (<1.0%, 2).  Ventricular Trigeminy was present.   No patient triggered events recorded   2D echo 09/05/2023 1. Left ventricular ejection fraction, by estimation, is 40 to 45%. The  left ventricle has  mildly decreased function. The left ventricle  demonstrates global hypokinesis. Left ventricular diastolic parameters are  consistent with Grade I diastolic  dysfunction (impaired relaxation). The global longitudinal strain is  abnormal.   2. Right ventricular systolic function is normal. The right ventricular  size is normal.   3. Left atrial size was mildly dilated.   4. The mitral valve is normal in structure. Mild mitral valve  regurgitation. No evidence of mitral stenosis.   5. The aortic valve is normal in structure. Aortic valve regurgitation is  not visualized. Aortic valve sclerosis is present, with no evidence of  aortic valve stenosis.   6. The inferior vena cava is normal in size with greater than 50%  respiratory variability, suggesting right atrial pressure of 3 mmHg.    Event Monitor (ZIO) 03/03/2023 Normal sinus rhythm Patient had a min HR of 49 bpm, max HR of 152 bpm, and avg HR of 75 bpm.    2 Supraventricular Tachycardia runs occurred, the run with the fastest interval lasting 15 beats with a max rate of 152 bpm (avg 135 bpm); the run with the fastest interval was also the longest.  Not patient triggered   Isolated SVEs were rare (<1.0%), SVE Couplets were rare (<1.0%), and SVE Triplets were rare (<1.0%).  Isolated VEs were rare (<1.0%), VE Couplets were rare (<1.0%), and no VE Triplets were present.   R/LHC 01/13/2023   Ost LM to LM lesion is 50% stenosed.   Ost LAD to Prox LAD lesion is 100% stenosed.   Mid LAD lesion is 60% stenosed.   2nd Diag lesion is 50% stenosed.   Prox RCA-1 lesion is 99% stenosed.   Prox RCA-2 lesion is 90% stenosed.   Prox Cx to Mid Cx lesion is 5% stenosed.   Ost Cx to Prox Cx lesion is 40% stenosed.   Mid Cx to Dist Cx lesion is 99% stenosed.   Dist Cx lesion is 50% stenosed.   Dist LAD lesion is 40% stenosed.   A drug-eluting stent was successfully placed using a STENT ONYX FRONTIER 3.0X18.   Post intervention, there is a 10%  residual stenosis.   LIMA and is moderate in size.   and is anatomically normal.   The graft exhibits no disease.   There is moderate left ventricular systolic dysfunction.   LV end diastolic pressure is moderately elevated.   The left ventricular ejection fraction is 35-45% by visual estimate.   1.  Left dominant coronary arteries with severe three-vessel coronary artery disease.  Patent grafts including LIMA to LAD and SVG to first and second diagonals.  Patent mid left circumflex stent.  However, there is subtotal occlusion of the mid to distal left circumflex with collaterals from the LAD. 2.  Moderately reduced LV systolic function. 3.  Right heart  catheterization showed mildly elevated RA pressure, moderate pulmonary hypertension, moderately to severely elevated wedge pressure and normal cardiac output. 4.  Difficult but successful angioplasty and drug-eluting stent placement to the mid/distal left circumflex.  The procedure was difficult due to calcifications and subtotal occlusion with difficulty crossing.   Recommendations: Dual antiplatelet therapy for at least 6 months and preferably longer. Aggressive treatment of risk factors. I increased the dose of oral furosemide to 40 mg twice daily which will be continued while hospitalized and then likely decreased to 40 mg once daily upon discharge.  He was only taking 20 mg daily of furosemide.   Lexiscan MPI 12/27/2022:   Abnormal pharmacologic myocardial perfusion stress test.   There is a moderate in size, severe, partially reversible basal and mid inferior/inferolateral defect consistent with ischemia and an element of scar.   Left ventricular systolic function is severely reduced (LVEF 25-35%).   Post-CABG findings, coronary artery calcification, and aortic atherosclerosis are noted on the attenuation correction CT.   1.4 cm hypodensity is noted in the liver, similar to the prior abdominal CT from 10/04/2021.   Compared with the prior  study from 04/22/2021, inferior/inferolateral defect and reduced LVEF are new.   This is a high-risk study.   2D echo 11/22/2022: 1. Left ventricular ejection fraction, by estimation, is 40 to 45%. Left  ventricular ejection fraction by 3D volume is 42 %. The left ventricle has  mildly decreased function. The left ventricle demonstrates global  hypokinesis. Left ventricular diastolic   parameters are consistent with Grade I diastolic dysfunction (impaired  relaxation). The average left ventricular global longitudinal strain is  -10.2 %.   2. Right ventricular systolic function is normal. The right ventricular  size is normal. There is mildly elevated pulmonary artery systolic  pressure. The estimated right ventricular systolic pressure is 39.8 mmHg.   3. Left atrial size was mildly dilated.   4. The mitral valve is normal in structure. Mild mitral valve  regurgitation. No evidence of mitral stenosis.   5. The aortic valve is normal in structure. Aortic valve regurgitation is  not visualized. Mild aortic valve stenosis. Aortic valve mean gradient  measures 12.0 mmHg.   6. The inferior vena cava is normal in size with greater than 50%  respiratory variability, suggesting right atrial pressure of 3 mmHg.   Comparison(s): 03/23/21 50-55%, Mild LVH, RVSP 45.    Carotid artery ultrasound 10/12/2022: IMPRESSION: Mild-to-moderate bilateral carotid bifurcation plaque with Doppler measurements indicative of less than 50% stenosis.   Lexiscan MPI 04/2021: Pharmacological myocardial perfusion imaging study with no significant  ischemia Normal wall motion, EF estimated at 52% No EKG changes concerning for ischemia at peak stress or in recovery. Low risk scan   2D echo 03/2021: 1. Left ventricular ejection fraction, by estimation, is 50 to 55%. The  left ventricle has low normal function. The left ventricle has no regional  wall motion abnormalities. There is mild left ventricular hypertrophy.  Left  ventricular diastolic  parameters are consistent with Grade I diastolic dysfunction (impaired  relaxation).   2. Right ventricular systolic function is normal. The right ventricular  size is normal. There is mildly elevated pulmonary artery systolic  pressure. The estimated right ventricular systolic pressure is 44.7 mmHg.   3. Left atrial size was mild to moderately dilated.   4. The mitral valve is normal in structure. Trivial mitral valve  regurgitation. No evidence of mitral stenosis.   5. The aortic valve is normal in structure. Aortic  valve regurgitation is  not visualized. Mild to moderate aortic valve sclerosis/calcification is  present, without any evidence of aortic stenosis.   Risk Assessment/Calculations:             Physical Exam:   VS:  BP (!) 128/56   Pulse 61   Ht 6' (1.829 m)   Wt 206 lb 12.8 oz (93.8 kg)   SpO2 98%   BMI 28.05 kg/m    Wt Readings from Last 3 Encounters:  11/16/23 206 lb 12.8 oz (93.8 kg)  10/24/23 208 lb (94.3 kg)  10/16/23 203 lb (92.1 kg)    GEN: Well nourished, well developed in no acute distress NECK: No JVD; No carotid bruits CARDIAC: RRR, no murmurs, rubs, gallops RESPIRATORY:  Clear to auscultation without rales, wheezing or rhonchi  ABDOMEN: Soft, non-tender, non-distended EXTREMITIES:  No edema; No deformity; various stages of bruising noted to the bilateral upper extremities  ASSESSMENT AND PLAN: .   HFmrEF/ICM with last echocardiogram revealing LVEF of 40-45%, global hypokinesis, G1 DD, and mild MR.  Left heart catheterization5/06/2023 showed subtotal occlusion of the mid and distal left circumflex with collaterals from the LAD.  DES was placed to the mid distal left circumflex.  He is euvolemic and well compensated on exam today with NYHA class II symptoms.  He is continued on losartan 25 mg daily and spironolactone 12.5 mg daily, and carvedilol 3.125 mg twice daily.  With complaints of worsening urination and stable weights that  continued to decrease his daily furosemide 40 take 20 mg daily has been decreased to as needed for weight gain of 2 pounds.  Will continue to try and escalate GDMT as tolerated and allowed.  Patient is not interested in starting SGLT2 inhibitor therapy at this time due to related cost.  Palpitations that have improved since coming off of Entresto and starting back on lisinopril.  He is continued on carvedilol 3.125 mg twice daily.  ZIO XT monitor revealed an average heart rate of 73 bpm with a bundle branch block and occasional trigeminy unfortunately he was unable to sleep with a CPAP at that time and thinks that his results have been skewed.  With COVID he was tachycardic with rates of 130s required dehydration.  He continues to monitor his heart rate with his Apple Watch.  Coronary artery disease s/p two-vessel CABG (1998), with subsequent PCI/BMS left circumflex (2022), he is currently stable with no continued anginal symptoms.  No indication for further ischemic evaluation at this time.  He has continued on aspirin 81 mg daily, clopidogrel 75 mg daily, carvedilol 3.25 mg twice daily, ezetimibe 10 mg daily, atorvastatin 80 mg daily isosorbide 30 mg daily, losartan 25 mg daily Ranexa 500 milligrams twice daily and sublingual nitroglycerin as needed  Hyperlipidemia with last LDL 59.  Has remained stable.  He has been continued on rosuvastatin 80 mg daily and ezetimibe 10 mg daily  Peripheral arterial disease and continues to be followed by Dr.Arida.  Lower arterial duplex revealed bilateral SFA disease.  He is currently without claudication or rest pain.  Continued medical management with aspirin and clopidogrel.  Bilateral carotid artery stenosis with ultrasound of his carotids completed 10/12/2022 showed mild to moderate bilateral carotid bifurcation plaque with Doppler measurements of less than 50% stenosis.  Will continue with surveillance monitoring.  He continues to deny lightheadedness/dizziness  syncope or near syncopal episodes.  Primary hypertension with a blood pressure today 128/56.  Blood pressures remain stable.  He has been continued on  losartan 25 mg daily carvedilol 3.25 mg twice daily and Lasix as needed.  He has been encouraged to monitor his pressure 1 to 2 hours postmedication administration as well.  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       Dispo: Patient to return to clinic to see MD/APP at previously scheduled appointment with primary cardiologist upcoming in June or sooner if needed  Signed, Nico Rogness, NP

## 2023-11-20 ENCOUNTER — Telehealth: Payer: Self-pay | Admitting: Family Medicine

## 2023-11-20 ENCOUNTER — Other Ambulatory Visit: Payer: Self-pay | Admitting: Family Medicine

## 2023-11-20 NOTE — Telephone Encounter (Signed)
 Plz notify I've sent in amoxicillin Rx to his pharmacy prior to dental procedure.

## 2023-11-20 NOTE — Telephone Encounter (Signed)
 Copied from CRM [id number]. Topic: Clinical - Prescription Issue >> Nov 20, 2023 10:51 AM DeAngela L wrote: Reason for CRM: Reason for CRM: Patient called and states he needs an antibiotic before he has a dental procedure 11/22/23 130pm Patient says he contacted the dental office today believing they would refill the prescription for him and the office told him to call his PCP forthe antibiotic prescription refill before he goes in to get dental clean. The [patient is not sure of the medications name but states that 6 months ago when he went for a dental cleaning he took 4 capsules before the appointment >> Nov 20, 2023 11:03 AM Martinique E wrote: Patient called back stating that [facility] will send in this request to his PCP for the medication.

## 2023-11-20 NOTE — Telephone Encounter (Signed)
 ERx

## 2023-11-20 NOTE — Telephone Encounter (Signed)
 Spoke with pt relaying Dr Timoteo Expose message. Pt verbalizes understanding and states he's already picked it up. Pt expresses his thanks.

## 2023-11-22 ENCOUNTER — Ambulatory Visit: Payer: HMO | Admitting: Dermatology

## 2023-11-26 ENCOUNTER — Encounter: Payer: Self-pay | Admitting: Urology

## 2023-11-28 ENCOUNTER — Ambulatory Visit: Admitting: Physician Assistant

## 2023-11-28 VITALS — BP 103/55 | HR 68 | Ht 71.0 in | Wt 206.0 lb

## 2023-11-28 DIAGNOSIS — R300 Dysuria: Secondary | ICD-10-CM | POA: Diagnosis not present

## 2023-11-28 LAB — URINALYSIS, COMPLETE
Bilirubin, UA: NEGATIVE
Glucose, UA: NEGATIVE
Ketones, UA: NEGATIVE
Leukocytes,UA: NEGATIVE
Nitrite, UA: NEGATIVE
Protein,UA: NEGATIVE
RBC, UA: NEGATIVE
Specific Gravity, UA: 1.015 (ref 1.005–1.030)
Urobilinogen, Ur: 0.2 mg/dL (ref 0.2–1.0)
pH, UA: 6 (ref 5.0–7.5)

## 2023-11-28 LAB — MICROSCOPIC EXAMINATION: Bacteria, UA: NONE SEEN

## 2023-11-28 LAB — BLADDER SCAN AMB NON-IMAGING: Scan Result: 136

## 2023-11-28 NOTE — Progress Notes (Signed)
 11/28/2023 9:33 AM   [name] 09/07/1940 [id number]  CC: Chief Complaint  Patient presents with   Recurrent UTI   HPI: [name] is a 83 y.o. 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   Today he reports 2 to 3 days of intermittent dysuria, which has resolved this morning.  He has not taken any medication for this.  He is due for his next scheduled surveillance cystoscopy later this month in advance of scheduled right shoulder replacement next month.  In-office UA and microscopy today pan-negative. PVR .  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 06/2019 Echo (in setting of COVID): EF 35-40%, mild LVH, g1 DD, glob HK; b. 09/2019 Echo: EF 50-55%, Gr1 DD; c. 03/2021 Echo: EF 50-55%, no rwma, mild LVH, GrI DD, nl RV fxn. RVSP 44.85mmHg. Mild-mod dil LA. Triv MR. Mild-mod Ao sclerosis.   Actinic keratosis    Anginal pain (HCC)    Aortic ectasia, abdominal (HCC)    a.) CT abd 01/01/2022: 2.9 cm infrarenal abdominal aortic ectasia   Bladder cancer (HCC) 2023   BPH (benign prostatic hyperplasia) 09/06/2007   CAD s/p CABG    a.) 1990 s/p MI--> CABG x 3; b.) LHC/PCI 2002 --> BMS (unk type) to LCX; c.) LHC 07/07/2015: 50% oLM-LM, 80% oRCA, 80% pRCA-1, 80% pRCA-2, 60% dLAD, 100% o-pLAD, 60% mLAD, 50% D2, LIMA-LAD, SVG-D2, SVG-RI --> med Rx; d.) MV 06/05/2020: fixed apical ant/apical defect. No sign ischemia; e.) MV 09/08/202: EF 52%, no ischemia.   Carotid arterial disease (HCC)    a.) 08/2016 Carotid U/S: <39% bilat; b.) carotid doppler 10/12/2022: mild-to-moderate (<50%) bilateral bifurcation plaque.   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   CKD (chronic kidney disease), stage III  (HCC)    Community acquired pneumonia of right lower lobe of lung 06/05/2017   Diverticulosis    Dupuytren's contracture of right hand 10/29/2008   DVT (deep venous thrombosis) (HCC) 1998   Elevated prostate specific antigen (PSA) 07/09/2008   Emphysema of lung (HCC)    GERD 04/30/2007   Heart murmur    History of 2019 novel coronavirus disease (COVID-19) 05/20/2019   History of hiatal hernia    History of shingles    HLD (hyperlipidemia) 04/26/2007   HTN (hypertension) 04/30/2007   Laceration of skin of left hand 03/08/2018   LBBB (left bundle branch block)    Lumbar disc disease with radiculopathy    Migraines    Myocardial infarction (HCC) 1990   a.) resulted in 3v CABG   Myocardial infarction (HCC) 2002   a.) second cardiac event; PCI with placement of BMS (unknown type) to LCx   Neuropathy of both feet    OSA on CPAP    Osteoarthritis    PAD (peripheral artery disease) (HCC)    a. 07/2017 LE duplex: RSFA 75-39m, LSFA 75-75m, 50-74d; c. 08/2018 Periph Angio: No signif AoIliac dzs. Mod-sev Ca2+ RSFA w/ diff dzs throughout- 3 vessel runoff. Borderline signif LSFA dzs w/ mod-sev Ca2+ vessels and 3 vessel runoff below the knee-->Med rx.   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   Right mid dorsum forearm - in situ - [facility]    Surgical History: Past Surgical History:  Procedure Laterality Date   ABDOMINAL AORTOGRAM W/LOWER EXTREMITY [name] 09/12/2018   Procedure: ABDOMINAL AORTOGRAM W/LOWER EXTREMITY;  Surgeon: Wenona Hamilton, MD;  [facility]: [facility];  Service: Cardiovascular;  Laterality: [name];   ANTERIOR CERVICAL DECOMP/DISCECTOMY FUSION [name] 07/11/2022   Anterior Cervical Decompression Fusion ,Interboy  Prothesis,Plate/Screws , Cervical four-five,Cervical five-six Larrie Po, Susana Enter, MD)   APPENDECTOMY     rupture   BACK SURGERY     1984 and then another one [facility]   BLADDER INSTILLATION [name] 10/19/2021   Procedure: BLADDER INSTILLATION OF GEMCITABINE;  Surgeon: Geraline Knapp, MD;  Location: [facility];  Service: Urology;  Laterality: [name];   BLADDER INSTILLATION [name] 11/15/2022   Procedure: BLADDER INSTILLATION OF GEMCITABINE;  Surgeon: Geraline Knapp, MD;  Location: [facility];  Service: Urology;  Laterality: [name];   CARDIAC CATHETERIZATION [name] 07/07/2015   Procedure: Left Heart Cath and Cors/Grafts Angiography;  Surgeon: Devorah Fonder, MD;  Location: [facility];  Service: Cardiovascular;  Laterality: [name];   CATARACT EXTRACTION, BILATERAL  2009   CERVICAL SPINE SURGERY  12/2016   cervical stenosis Larrie Po)   COLONOSCOPY  03/2010   HP polyp, diverticulosis, rpt 10 yrs ([name])   CORONARY ANGIOPLASTY  11/30/2000   LCX   CORONARY ARTERY BYPASS GRAFT  1990   3 vessel    CORONARY STENT INTERVENTION [name] 01/13/2023   Procedure: CORONARY STENT INTERVENTION;  Surgeon: Wenona Hamilton, MD;  Location: [facility];  Service: Cardiovascular;  Laterality: [name];   CYSTOSCOPY  12/23/2010   [facility]   JOINT REPLACEMENT     KNEE ARTHROSCOPY Right    x2   LAMINOTOMY  1986   L5/S1 lumbar laminotomy for two ruptured discs/fusion   LUMBAR LAMINECTOMY/DECOMPRESSION MICRODISCECTOMY [name] 07/13/2016   Procedure: LUMBAR TWO-THREE, LUMBAR THREE-FOUR, LUMBAR FOUR-FIVE LAMINECTOMY AND FORAMINOTOMY;  Surgeon: Garry Kansas, MD;  Location: [facility];  Service: Neurosurgery;  Laterality: [name];  LAMINECTOMY AND FORAMINOTOMY L2-L3, L3-L4,L4-L5   LUMBAR SPINE SURGERY  06/2020   Bilateral redo laminectomy/laminotomy/foraminotomies/medial facetectomy to decompress the bilateral L4 and L5 nerve roots ([facility])   RIGHT/LEFT HEART CATH AND CORONARY ANGIOGRAPHY Bilateral 01/13/2023   Procedure: RIGHT/LEFT HEART  CATH AND CORONARY ANGIOGRAPHY;  Surgeon: Wenona Hamilton, MD;  Location: [facility];  Service: Cardiovascular;  Laterality: Bilateral;   TOTAL HIP ARTHROPLASTY Bilateral 1999,2000   TOTAL KNEE ARTHROPLASTY Right 03/06/2017   Procedure: RIGHT TOTAL KNEE ARTHROPLASTY;  Surgeon: Liliane Rei, MD;  Location: [facility];  Service: Orthopedics;  Laterality: Right;   TRANSURETHRAL RESECTION OF BLADDER TUMOR [name] 10/19/2021   Procedure: TRANSURETHRAL RESECTION OF BLADDER TUMOR (TURBT);  Surgeon: Geraline Knapp, MD;  Location: [facility];  Service: Urology;  Laterality: [name];   TRANSURETHRAL RESECTION OF BLADDER TUMOR [name] 11/15/2022   Procedure: TRANSURETHRAL RESECTION OF BLADDER TUMOR (TURBT);  Surgeon: Geraline Knapp, MD;  Location: [facility];  Service: Urology;  Laterality: [name];    Home Medications:  Allergies as of 11/28/2023       Reactions   Vioxx [rofecoxib] Other (See Comments)   Hemorrhage    Spiriva Respimat [tiotropium Bromide Monohydrate] Other (See Comments)   Elevated bp   Contrast  Media [iodinated Contrast Media] Itching, Rash   Delayed reaction post abdominal aortagram.    Morphine Nausea Only, Other (See Comments)   Irritability        Medication List        Accurate as of November 28, 2023  9:33 AM. If you have any questions, ask your nurse or doctor.          albuterol (2.5 MG/3ML) 0.083% nebulizer solution Commonly known as: PROVENTIL TAKE 3 MLS BY NEBULIZATION EVERY 6 HOURSAS NEEDED FOR WHEEZING OR SHORTNESS OF BREATH   albuterol 108 (90 Base) MCG/ACT inhaler Commonly known as: VENTOLIN HFA TAKE 2 PUFFS INTO LUNGS EVERY 6 HOURS ASNEEDED FOR WHEEZING OR SHORTNESS OF BREATH   amoxicillin 500 MG capsule Commonly known as: AMOXIL TAKE 4 CAPSULES 30-60 MINUTES PRIOR TO PROCEDURE   aspirin EC 81 MG tablet Take 1 tablet (81 mg total) by mouth daily.   atorvastatin 20 MG tablet Commonly known as: LIPITOR TAKE 1 TABLET BY MOUTH DAILY   carvedilol 3.125 MG  tablet Commonly known as: COREG TAKE 1 TABLET BY MOUTH TWICE DAILY   clopidogrel 75 MG tablet Commonly known as: PLAVIX Take 1 tablet (75 mg total) by mouth daily with breakfast.   cyclobenzaprine 5 MG tablet Commonly known as: FLEXERIL Take 1 tablet (5 mg total) by mouth 3 (three) times daily as needed for muscle spasms.   docusate sodium 100 MG capsule Commonly known as: COLACE Take 1 capsule (100 mg total) by mouth daily as needed for mild constipation.   Dupixent 300 MG/2ML Soaj Generic drug: Dupilumab Inject 300 mg into the skin every 14 (fourteen) days. **patient completed loading dose in clinic**   ezetimibe 10 MG tablet Commonly known as: ZETIA TAKE 1 TABLET BY MOUTH DAILY   famotidine 20 MG tablet Commonly known as: PEPCID Take 20 mg by mouth daily.   fluticasone 50 MCG/ACT nasal spray Commonly known as: FLONASE Place 1 spray into both nostrils daily as needed for allergies or rhinitis.   furosemide 40 MG tablet Commonly known as: LASIX Take 0.5 tablets (20 mg total) by mouth daily as needed. Take 1 tablet (40 mg) daily as need for weight greater then 208.   gabapentin 600 MG tablet Commonly known as: NEURONTIN Take 1 tablet (600 mg total) by mouth 3 (three) times daily.   HYDROcodone-acetaminophen 5-325 MG tablet Commonly known as: NORCO/VICODIN Take 1 tablet by mouth 3 (three) times daily as needed for moderate pain (pain score 4-6).   hydrocortisone 2.5 % cream Apply topically 2 (two) times daily as needed (Rash).   isosorbide mononitrate 30 MG 24 hr tablet Commonly known as: IMDUR Take 1 tablet (30 mg total) by mouth daily.   losartan 25 MG tablet Commonly known as: COZAAR Take 1 tablet (25 mg total) by mouth daily.   montelukast 10 MG tablet Commonly known as: SINGULAIR TAKE 1 TABLET BY MOUTH DAILY   mupirocin ointment 2 % Commonly known as: BACTROBAN Apply 1 Application topically 2 (two) times daily.   nitroGLYCERIN 0.4 MG SL  tablet Commonly known as: NITROSTAT Place 1 tablet (0.4 mg total) under the tongue every 5 (five) minutes as needed for chest pain.   polyethylene glycol 17 g packet Commonly known as: MIRALAX / GLYCOLAX Take 17 g by mouth daily as needed for mild constipation.   ranolazine 500 MG 12 hr tablet Commonly known as: RANEXA TAKE ONE (1) TABLET BY MOUTH TWO TIMES PER DAY   spironolactone 25 MG tablet Commonly  known as: ALDACTONE Take 0.5 tablets (12.5 mg total) by mouth daily.   tamsulosin 0.4 MG Caps capsule Commonly known as: FLOMAX TAKE 1 CAPSULE BY MOUTH AT BEDTIME   Trelegy Ellipta 200-62.5-25 MCG/ACT Aepb Generic drug: Fluticasone-Umeclidin-Vilant Inhale 1 puff into the lungs daily.   VITAMIN B-12 ER PO Take 1 capsule by mouth daily.   Vitamin D3 25 MCG (1000 UT) Caps Take 1 capsule (1,000 Units total) by mouth 2 (two) times a week.        Allergies:  Allergies  Allergen Reactions   Vioxx [Rofecoxib] Other (See Comments)    Hemorrhage    Spiriva Respimat [Tiotropium Bromide Monohydrate] Other (See Comments)    Elevated bp   Contrast Media [Iodinated Contrast Media] Itching and Rash    Delayed reaction post abdominal aortagram.    Morphine Nausea Only and Other (See Comments)    Irritability     Family History: Family History  Problem Relation Age of Onset   Stroke Mother    Heart attack Mother    Diabetes Mother    Stroke Father    Heart disease Father    Polycystic kidney disease Father    Cancer Sister        throat   Polycystic kidney disease Sister    Diabetes Brother        1/2 brother   Cancer Other        5/7 nephews with lung cancer    Social History:   reports that he quit smoking about 35 years ago. His smoking use included cigarettes. He started smoking about 60 years ago. He has a 25 pack-year smoking history. He has been exposed to tobacco smoke. He has never used smokeless tobacco. He reports that he does not drink alcohol and does not  use drugs.  Physical Exam: BP (!) 103/55   Pulse 68   Ht 5\' 11"  (1.803 m)   Wt 206 lb (93.4 kg)   BMI 28.73 kg/m   Constitutional:  Alert and oriented, no acute distress, nontoxic appearing HEENT: [place], AT Cardiovascular: No clubbing, cyanosis, or edema Respiratory: Normal respiratory effort, no increased work of breathing Skin: No rashes, bruises or suspicious lesions Neurologic: Grossly intact, no focal deficits, moving all 4 extremities Psychiatric: Normal mood and affect  Laboratory Data: Results for orders placed or performed in visit on 11/28/23  Microscopic Examination   Collection Time: 11/28/23  9:22 AM   Urine  Result Value Ref Range   WBC, UA 0-5 0 - 5 /hpf   RBC, Urine 0-2 0 - 2 /hpf   Epithelial Cells (non renal) 0-10 0 - 10 /hpf   Bacteria, UA None seen None seen/Few  Urinalysis, Complete   Collection Time: 11/28/23  9:22 AM  Result Value Ref Range   Specific Gravity, UA 1.015 1.005 - 1.030   pH, UA 6.0 5.0 - 7.5   Color, UA Yellow Yellow   Appearance Ur Clear Clear   Leukocytes,UA Negative Negative   Protein,UA Negative Negative/Trace   Glucose, UA Negative Negative   Ketones, UA Negative Negative   RBC, UA Negative Negative   Bilirubin, UA Negative Negative   Urobilinogen, Ur 0.2 0.2 - 1.0 mg/dL   Nitrite, UA Negative Negative   Microscopic Examination See below:   Bladder Scan (Post Void Residual) in office   Collection Time: 11/28/23  9:22 AM  Result Value Ref Range   Scan Result 136    *Note: Due to a large number of results and/or  encounters for the requested time period, some results have not been displayed. A complete set of results can be found in Results Review.   Assessment & Plan:   1. Dysuria (Primary) UA bland and he is emptying at baseline.  Will send for culture in advance of upcoming cystoscopy, but anticipate this will be negative. - Urinalysis, Complete - Bladder Scan (Post Void Residual) in office - CULTURE, URINE COMPREHENSIVE    Return for Will call with results.  Kathreen Pare, PA-C  [facility] [place] [address], [address] [place], [place] [address] [phone]

## 2023-12-06 ENCOUNTER — Ambulatory Visit: Admitting: Urology

## 2023-12-06 VITALS — BP 166/70 | HR 72 | Ht 72.0 in | Wt 203.0 lb

## 2023-12-06 DIAGNOSIS — D494 Neoplasm of unspecified behavior of bladder: Secondary | ICD-10-CM

## 2023-12-06 DIAGNOSIS — R300 Dysuria: Secondary | ICD-10-CM

## 2023-12-06 LAB — CULTURE, URINE COMPREHENSIVE

## 2023-12-06 LAB — URINALYSIS, COMPLETE
Bilirubin, UA: NEGATIVE
Glucose, UA: NEGATIVE
Ketones, UA: NEGATIVE
Leukocytes,UA: NEGATIVE
Nitrite, UA: NEGATIVE
Protein,UA: NEGATIVE
Specific Gravity, UA: 1.01 (ref 1.005–1.030)
Urobilinogen, Ur: 0.2 mg/dL (ref 0.2–1.0)
pH, UA: 5.5 (ref 5.0–7.5)

## 2023-12-06 LAB — MICROSCOPIC EXAMINATION

## 2023-12-06 MED ORDER — NITROFURANTOIN MONOHYD MACRO 100 MG PO CAPS
100.0000 mg | ORAL_CAPSULE | Freq: Once | ORAL | Status: AC
  Administered 2023-12-06: 100 mg via ORAL

## 2023-12-06 MED ORDER — CIPROFLOXACIN HCL 250 MG PO TABS: 250.0000 mg | ORAL_TABLET | Freq: Two times a day (BID) | ORAL | 0 refills | Status: DC

## 2023-12-06 NOTE — Progress Notes (Signed)
   12/06/23  CC:  Chief Complaint  Patient presents with   Cysto   Urologic history: 1.  Ta high-grade urothelial carcinoma bladder Incidentally noted on pelvic MRI ordered by orthopedics to have a 15 x 20 mm bladder mass near right UO; cystoscopy with 3 cm papillary tumor TURBT 10/19/2025 with high-grade tumor and a low-grade tumor left posterolateral wall Induction BCG x6 completed 01/20/2022 Maintenance BCG x 3: 05/23/2022 MR urogram 03/2022 showed no upper tract abnormalities TURBT 11/2022 for a recurrent papillary tumor on surveillance; path high-grade Ta urothelial carcinoma Reinduction BCG x 6 completed 02/15/2023 Maintenance BCG x 3 completed: 07/06/2023  2.  BPH with LUTS Cystoscopy moderate lateral lobe enlargement/bladder neck elevation On tamsulosin    HPI: No complaints today.  Denies dysuria, gross hematuria.  UA negative  See rooming tab for vitals   Cystoscopy Procedure Note  Patient identification was confirmed, informed consent was obtained, and patient was prepped using Betadine solution.  Lidocaine  jelly was administered per urethral meatus.     Pre-Procedure: - Inspection reveals a normal caliber urethral meatus.  Procedure: The flexible cystoscope was introduced without difficulty - No urethral strictures/lesions are present. - Prostate moderate lateral lobe enlargement -  Moderate elevation  bladder neck - Bilateral ureteral orifices identified - Bladder mucosa  reveals 2 small, < 5 mm papillary lesions mid posterior wall - No bladder stones - Mild/moderate trabeculation  Retroflexion shows no abnormalities/tumor   Post-Procedure: - Patient tolerated the procedure well  Assessment/ Plan: Recurrent tumor posterior wall Findings discussed and will schedule bladder biopsy/fulguration Scheduled for right shoulder replacement early May 2025 and based on the size of these tumors will wait until after his shoulder surgery to schedule Urine culture last  week grew a low level Enterococcus and staph hemolyticus.  Based on sensitivities received Macrobid  100 mg prior to cystoscopy Since he is scheduled for shoulder replacement we will send in Rx Cipro .  The staph was minimal colony count and should be covered by the Macrobid    Geraline Knapp, MD

## 2023-12-08 ENCOUNTER — Other Ambulatory Visit: Payer: Self-pay

## 2023-12-08 MED ORDER — NITROFURANTOIN MONOHYD MACRO 100 MG PO CAPS: 100.0000 mg | ORAL_CAPSULE | Freq: Two times a day (BID) | ORAL | 0 refills | Status: DC

## 2023-12-12 ENCOUNTER — Telehealth: Payer: Self-pay

## 2023-12-12 ENCOUNTER — Other Ambulatory Visit: Payer: Self-pay

## 2023-12-12 ENCOUNTER — Ambulatory Visit: Admitting: Dermatology

## 2023-12-12 DIAGNOSIS — D494 Neoplasm of unspecified behavior of bladder: Secondary | ICD-10-CM

## 2023-12-12 NOTE — Progress Notes (Signed)
 Surgical Physician Order Form [facility] [place]  Dr. Darlynn Elam, MD  * Scheduling expectation : Sched for shoulder surg 5/8; sched 01/2024  *Length of Case: 30 min  *Clearance needed: per Geraldean Klein  *Anticoagulation Instructions: Hold all anticoagulants  *Aspirin  Instructions: Hold Plavix  ok to continue ASA  *Post-op visit Date/Instructions:  1-2 week with pathology review  *Diagnosis: Bladder Tumor  *Procedure:  Cysto Bladder Biopsy (28413)   Additional orders: [name]  -Admit type: OUTpatient  -Anesthesia: Choice  -VTE Prophylaxis Standing Order SCD's       Other:   -Standing Lab Orders Per Anesthesia    Lab other: UA&Urine Culture  -Standing Test orders EKG/Chest x-ray per Anesthesia       Test other:   - Medications:  Ancef  2gm IV  -Other orders:  [name]

## 2023-12-12 NOTE — Patient Instructions (Signed)
 SURGICAL WAITING ROOM VISITATION  Patients having surgery or a procedure may have no more than 2 support people in the waiting area - these visitors may rotate.    Children under the age of 50 must have an adult with them who is not the patient.  Due to an increase in RSV and influenza rates and associated hospitalizations, children ages 42 and under may not visit patients in [facility] hospitals.  Visitors with respiratory illnesses are discouraged from visiting and should remain at home.  If the patient needs to stay at the hospital during part of their recovery, the visitor guidelines for inpatient rooms apply. Pre-op nurse will coordinate an appropriate time for 1 support person to accompany patient in pre-op.  This support person may not rotate.    Please refer to the [facility] website for the visitor guidelines for Inpatients (after your surgery is over and you are in a regular room).    Your procedure is scheduled on: 12/21/23   Report to [facility] Main Entrance    Report to admitting at 5:15 AM   Call this number if you have problems the morning of surgery [phone]   Do not eat food :After Midnight.   After Midnight you may have the following liquids until 4:30 AM DAY OF SURGERY  Water Non-Citrus Juices (without pulp, NO RED-Apple, White grape, White cranberry) Black Coffee (NO MILK/CREAM OR CREAMERS, sugar ok)  Clear Tea (NO MILK/CREAM OR CREAMERS, sugar ok) regular and decaf                             Plain Jell-O (NO RED)                                           Fruit ices (not with fruit pulp, NO RED)                                     Popsicles (NO RED)                                                               Sports drinks like Gatorade (NO RED)                 The day of surgery:  Drink ONE (1) Pre-Surgery G2 at 4:30 AM the morning of surgery. Drink in one sitting. Do not sip.  This drink was given to you during your hospital  pre-op  appointment visit. Nothing else to drink after completing the  Pre-Surgery G2.          If you have questions, please contact your surgeon's office.   FOLLOW BOWEL PREP AND ANY ADDITIONAL PRE OP INSTRUCTIONS YOU RECEIVED FROM YOUR SURGEON'S OFFICE!!!     Oral Hygiene is also important to reduce your risk of infection.                                    Remember - BRUSH YOUR  TEETH THE MORNING OF SURGERY WITH YOUR REGULAR TOOTHPASTE  DENTURES WILL BE REMOVED PRIOR TO SURGERY PLEASE DO NOT APPLY "Poly grip" OR ADHESIVES!!!   Stop all vitamins and herbal supplements 7 days before surgery.   Take these medicines the morning of surgery with A SIP OF WATER: Inhalers, Atorvastatin , Carvedilol , Zetia , Famotidine , Gabapentin , Norco, Isosorbide , Singulair   Bring CPAP mask and tubing day of surgery.                              You may not have any metal on your body including jewelry, and body piercing             Do not wear make-up, lotions, powders, cologne, or deodorant  Do not wear nail polish including gel and S&S, artificial/acrylic nails, or any other type of covering on natural nails including finger and toenails. If you have artificial nails, gel coating, etc. that needs to be removed by a nail salon please have this removed prior to surgery or surgery may need to be canceled/ delayed if the surgeon/ anesthesia feels like they are unable to be safely monitored.   Do not shave  48 hours prior to surgery.               Men may shave face and neck.   Do not bring valuables to the hospital. [facility] IS NOT             RESPONSIBLE   FOR VALUABLES.   Contacts, glasses, dentures or bridgework may not be worn into surgery.   Bring small overnight bag day of surgery.   DO NOT BRING YOUR HOME MEDICATIONS TO THE HOSPITAL. PHARMACY WILL DISPENSE MEDICATIONS LISTED ON YOUR MEDICATION LIST TO YOU DURING YOUR ADMISSION IN THE HOSPITAL!    Patients discharged on the day of surgery will not  be allowed to drive home.  Someone NEEDS to stay with you for the first 24 hours after anesthesia.              Please read over the following fact sheets you were given: IF YOU HAVE QUESTIONS ABOUT YOUR PRE-OP INSTRUCTIONS PLEASE CALL [phone]   If you received a COVID test during your pre-op visit  it is requested that you wear a mask when out in public, stay away from anyone that may not be feeling well and notify your surgeon if you develop symptoms. If you test positive for Covid or have been in contact with anyone that has tested positive in the last 10 days please notify you surgeon.      Pre-operative 5 CHG Bath Instructions   You can play a key role in reducing the risk of infection after surgery. Your skin needs to be as free of germs as possible. You can reduce the number of germs on your skin by washing with CHG (chlorhexidine  gluconate) soap before surgery. CHG is an antiseptic soap that kills germs and continues to kill germs even after washing.   DO NOT use if you have an allergy to chlorhexidine /CHG or antibacterial soaps. If your skin becomes reddened or irritated, stop using the CHG and notify one of our RNs at [phone].   Please shower with the CHG soap starting 4 days before surgery using the following schedule:     Please keep in mind the following:  DO NOT shave, including legs and underarms, starting the day of your first shower.   You  may shave your face at any point before/day of surgery.  Place clean sheets on your bed the day you start using CHG soap. Use a clean washcloth (not used since being washed) for each shower. DO NOT sleep with pets once you start using the CHG.   CHG Shower Instructions:  If you choose to wash your hair and private area, wash first with your normal shampoo/soap.  After you use shampoo/soap, rinse your hair and body thoroughly to remove shampoo/soap residue.  Turn the water OFF and apply about 3 tablespoons (45 ml) of CHG soap  to a CLEAN washcloth.  Apply CHG soap ONLY FROM YOUR NECK DOWN TO YOUR TOES (washing for 3-5 minutes)  DO NOT use CHG soap on face, private areas, open wounds, or sores.  Pay special attention to the area where your surgery is being performed.  If you are having back surgery, having someone wash your back for you may be helpful. Wait 2 minutes after CHG soap is applied, then you may rinse off the CHG soap.  Pat dry with a clean towel  Put on clean clothes/pajamas   If you choose to wear lotion, please use ONLY the CHG-compatible lotions on the back of this paper.     Additional instructions for the day of surgery: DO NOT APPLY any lotions, deodorants, cologne, or perfumes.   Put on clean/comfortable clothes.  Brush your teeth.  Ask your nurse before applying any prescription medications to the skin.      CHG Compatible Lotions   Aveeno Moisturizing lotion  Cetaphil Moisturizing Cream  Cetaphil Moisturizing Lotion  Clairol Herbal Essence Moisturizing Lotion, Dry Skin  Clairol Herbal Essence Moisturizing Lotion, Extra Dry Skin  Clairol Herbal Essence Moisturizing Lotion, Normal Skin  Curel Age Defying Therapeutic Moisturizing Lotion with Alpha Hydroxy  Curel Extreme Care Body Lotion  Curel Soothing Hands Moisturizing Hand Lotion  Curel Therapeutic Moisturizing Cream, Fragrance-Free  Curel Therapeutic Moisturizing Lotion, Fragrance-Free  Curel Therapeutic Moisturizing Lotion, Original Formula  Eucerin Daily Replenishing Lotion  Eucerin Dry Skin Therapy Plus Alpha Hydroxy Crme  Eucerin Dry Skin Therapy Plus Alpha Hydroxy Lotion  Eucerin Original Crme  Eucerin Original Lotion  Eucerin Plus Crme Eucerin Plus Lotion  Eucerin TriLipid Replenishing Lotion  Keri Anti-Bacterial Hand Lotion  Keri Deep Conditioning Original Lotion Dry Skin Formula Softly Scented  Keri Deep Conditioning Original Lotion, Fragrance Free Sensitive Skin Formula  Keri Lotion Fast Absorbing Fragrance Free  Sensitive Skin Formula  Keri Lotion Fast Absorbing Softly Scented Dry Skin Formula  Keri Original Lotion  Keri Skin Renewal Lotion Keri Silky Smooth Lotion  Keri Silky Smooth Sensitive Skin Lotion  Nivea Body Creamy Conditioning Oil  Nivea Body Extra Enriched Lotion  Nivea Body Original Lotion  Nivea Body Sheer Moisturizing Lotion Nivea Crme  Nivea Skin Firming Lotion  NutraDerm 30 Skin Lotion  NutraDerm Skin Lotion  NutraDerm Therapeutic Skin Cream  NutraDerm Therapeutic Skin Lotion  ProShield Protective Hand Cream  Provon moisturizing lotion   Incentive Spirometer  An incentive spirometer is a tool that can help keep your lungs clear and active. This tool measures how well you are filling your lungs with each breath. Taking long deep breaths may help reverse or decrease the chance of developing breathing (pulmonary) problems (especially infection) following: A long period of time when you are unable to move or be active. BEFORE THE PROCEDURE  If the spirometer includes an indicator to show your best effort, your nurse or respiratory therapist will set it  to a desired goal. If possible, sit up straight or lean slightly forward. Try not to slouch. Hold the incentive spirometer in an upright position. INSTRUCTIONS FOR USE  Sit on the edge of your bed if possible, or sit up as far as you can in bed or on a chair. Hold the incentive spirometer in an upright position. Breathe out normally. Place the mouthpiece in your mouth and seal your lips tightly around it. Breathe in slowly and as deeply as possible, raising the piston or the ball toward the top of the column. Hold your breath for 3-5 seconds or for as long as possible. Allow the piston or ball to fall to the bottom of the column. Remove the mouthpiece from your mouth and breathe out normally. Rest for a few seconds and repeat Steps 1 through 7 at least 10 times every 1-2 hours when you are awake. Take your time and take a few  normal breaths between deep breaths. The spirometer may include an indicator to show your best effort. Use the indicator as a goal to work toward during each repetition. After each set of 10 deep breaths, practice coughing to be sure your lungs are clear. If you have an incision (the cut made at the time of surgery), support your incision when coughing by placing a pillow or rolled up towels firmly against it. Once you are able to get out of bed, walk around indoors and cough well. You may stop using the incentive spirometer when instructed by your caregiver.  RISKS AND COMPLICATIONS Take your time so you do not get dizzy or light-headed. If you are in pain, you may need to take or ask for pain medication before doing incentive spirometry. It is harder to take a deep breath if you are having pain. AFTER USE Rest and breathe slowly and easily. It can be helpful to keep track of a log of your progress. Your caregiver can provide you with a simple table to help with this. If you are using the spirometer at home, follow these instructions: SEEK MEDICAL CARE IF:  You are having difficultly using the spirometer. You have trouble using the spirometer as often as instructed. Your pain medication is not giving enough relief while using the spirometer. You develop fever of 100.5 F (38.1 C) or higher. SEEK IMMEDIATE MEDICAL CARE IF:  You cough up bloody sputum that had not been present before. You develop fever of 102 F (38.9 C) or greater. You develop worsening pain at or near the incision site. MAKE SURE YOU:  Understand these instructions. Will watch your condition. Will get help right away if you are not doing well or get worse. Document Released: 12/12/2006 Document Revised: 10/24/2011 Document Reviewed: 02/12/2007 ExitCare Patient Information 2014 [employer].   ________________________________________________________________________ [facility]- Preparing for Total Shoulder  Arthroplasty    Before surgery, you can play an important role. Because skin is not sterile, your skin needs to be as free of germs as possible. You can reduce the number of germs on your skin by using the following products. Benzoyl Peroxide Gel Reduces the number of germs present on the skin Applied twice a day to shoulder area starting two days before surgery    ==================================================================  Please follow these instructions carefully:  BENZOYL PEROXIDE 5% GEL  Please do not use if you have an allergy to benzoyl peroxide.   If your skin becomes reddened/irritated stop using the benzoyl peroxide.  Starting two days before surgery, apply as follows:  Apply benzoyl peroxide in the morning and at night. Apply after taking a shower. If you are not taking a shower clean entire shoulder front, back, and side along with the armpit with a clean wet washcloth.  Place a quarter-sized dollop on your shoulder and rub in thoroughly, making sure to cover the front, back, and side of your shoulder, along with the armpit.   2 days before ____ AM   ____ PM              1 day before ____ AM   ____ PM                         Do this twice a day for two days.  (Last application is the night before surgery, AFTER using the CHG soap as described below).  Do NOT apply benzoyl peroxide gel on the day of surgery.

## 2023-12-12 NOTE — Progress Notes (Signed)
 COVID Vaccine Completed: yes  Date of COVID positive in last 90 days:  PCP - Claire Crick, MD Cardiologist - Ronald Cockayne, NP LOV 11/16/23 Pulmonology- Coralie Derrick, MD LOV 10/14/23  Medical clearance by Dr. Mariam Shingles in media tab dated 11/20/23  Chest x-ray - 10/24/23 Epic EKG - 11/16/23 Epic Stress Test - 12/27/22 Epic ECHO - 09/05/23 Epic Cardiac Cath - 01/13/23 Epic Pacemaker/ICD device last checked: Spinal Cord Stimulator:  Bowel Prep -   Sleep Study -  CPAP -   Fasting Blood Sugar - diet controlled Checks Blood Sugar _____ times a day  Last dose of GLP1 agonist-  [name] GLP1 instructions:  Hold 7 days before surgery    Last dose of SGLT-2 inhibitors-  [name] SGLT-2 instructions:  Hold 3 days before surgery    Blood Thinner Instructions: Plavix  Aspirin  Instructions: ASA 81 Last Dose:  Activity level:  Can go up a flight of stairs and perform activities of daily living without stopping and without symptoms of chest pain or shortness of breath.  Able to exercise without symptoms  Unable to go up a flight of stairs without symptoms of     Anesthesia review: HTN, CAD, PAD, cardiomyopathy, asthma, OSA, DM2, CHF, CKD, DVT, CABG x3, COPD  Patient denies shortness of breath, fever, cough and chest pain at PAT appointment  Patient verbalized understanding of instructions that were given to them at the PAT appointment. Patient was also instructed that they will need to review over the PAT instructions again at home before surgery.

## 2023-12-12 NOTE — Telephone Encounter (Signed)
 Per Dr. Cherylene Corrente, Patient is to be scheduled for Cystoscopy with Bladder Biopsy   [name] was contacted and possible surgical dates were discussed, Tuesday June 3rd, 2025 was agreed upon for surgery. Patient was instructed that Dr. Cherylene Corrente will require them to provide a pre-op UA & CX prior to surgery. This was ordered and scheduled drop off appointment was made for 01/05/2024.    Patient was directed to call [phone] between 1-3pm the day before surgery to find out surgical arrival time.  Instructions were given not to eat or drink from midnight on the night before surgery and have a driver for the day of surgery. On the surgery day patient was instructed to enter through the [facility] entrance of [facility] report the Same Day Surgery desk.   Pre-Admit Testing will be in contact via phone to set up an interview with the anesthesia team to review your history and medications prior to surgery.   Reminder of this information was sent via MyChart to the patient.

## 2023-12-12 NOTE — Progress Notes (Signed)
   [facility] Surgical Posting Form  Surgery Date: Date: 01/16/2024  Surgeon: Dr. Darlynn Elam, MD  Inpt ( No  )   Outpt (Yes)   Obs ( No  )   Diagnosis: D49.4 Bladder Tumor  -CPT: 52204  Surgery: Cystoscopy with Bladder Biopsy  Stop Anticoagulations: Yes, will need to hold Plavix , may continue ASA  Cardiac/Medical/Pulmonary Clearance needed: Yes  Clearance needed from Dr: Jerelene Monday  Clearance request sent on: Date: 12/12/23   *Orders entered into EPIC  Date: 12/12/23   *Case booked in EPIC  Date: 12/12/23  *Notified pt of Surgery: Date: 12/12/23   PRE-OP UA & CX: yes, will obtain in clinic on 01/05/2024  *Placed into Prior Authorization Work [place] Date: 12/12/23  Assistant/laser/rep:No

## 2023-12-12 NOTE — Progress Notes (Signed)
  Phone Number: [phone] for Surgical Coordinator Fax Number: [phone]  REQUEST FOR SURGICAL CLEARANCE       Date: Date: 12/12/23  Faxed to: Dr. Jerelene Monday, MD   Surgeon: Dr. Darlynn Elam, MD     Date of Surgery: 01/16/2024  Operation: Cystoscopy with Bladder Biopsy   Anesthesia Type: General   Diagnosis: Bladder Tumor  Patient Requires:   Cardiac / Vascular Clearance : Yes  Reason: Will need patient to hold Plavix , per Dr. Cherylene Corrente he may remain on ASA   Risk Assessment:    Low   []       Moderate   []     High   []           This patient is optimized for surgery  YES []       NO   []    I recommend further assessment/workup prior to surgery. YES []      NO  []   Appointment scheduled for: _______________________   Further recommendations: ____________________________________     Physician Signature:__________________________________   Printed Name: ________________________________________   Date: _________________

## 2023-12-13 ENCOUNTER — Encounter (HOSPITAL_COMMUNITY)
Admission: RE | Admit: 2023-12-13 | Discharge: 2023-12-13 | Disposition: A | Discharge status: Home / Self Care | Source: Ambulatory Visit | Attending: Orthopedic Surgery | Admitting: Orthopedic Surgery

## 2023-12-13 ENCOUNTER — Telehealth: Payer: Self-pay

## 2023-12-13 ENCOUNTER — Other Ambulatory Visit: Payer: Self-pay

## 2023-12-13 ENCOUNTER — Encounter (HOSPITAL_COMMUNITY): Payer: Self-pay

## 2023-12-13 VITALS — BP 135/54 | HR 70 | Temp 97.7°F | Resp 16 | Ht 72.0 in | Wt 206.0 lb

## 2023-12-13 DIAGNOSIS — I5032 Chronic diastolic (congestive) heart failure: Secondary | ICD-10-CM | POA: Insufficient documentation

## 2023-12-13 DIAGNOSIS — Z87891 Personal history of nicotine dependence: Secondary | ICD-10-CM | POA: Insufficient documentation

## 2023-12-13 DIAGNOSIS — Z951 Presence of aortocoronary bypass graft: Secondary | ICD-10-CM | POA: Insufficient documentation

## 2023-12-13 DIAGNOSIS — Z01818 Encounter for other preprocedural examination: Secondary | ICD-10-CM

## 2023-12-13 DIAGNOSIS — Z01812 Encounter for preprocedural laboratory examination: Secondary | ICD-10-CM | POA: Diagnosis not present

## 2023-12-13 DIAGNOSIS — I2510 Atherosclerotic heart disease of native coronary artery without angina pectoris: Secondary | ICD-10-CM | POA: Insufficient documentation

## 2023-12-13 DIAGNOSIS — E119 Type 2 diabetes mellitus without complications: Secondary | ICD-10-CM

## 2023-12-13 DIAGNOSIS — I130 Hypertensive heart and chronic kidney disease with heart failure and stage 1 through stage 4 chronic kidney disease, or unspecified chronic kidney disease: Secondary | ICD-10-CM | POA: Diagnosis not present

## 2023-12-13 DIAGNOSIS — N183 Chronic kidney disease, stage 3 unspecified: Secondary | ICD-10-CM | POA: Insufficient documentation

## 2023-12-13 DIAGNOSIS — M75101 Unspecified rotator cuff tear or rupture of right shoulder, not specified as traumatic: Secondary | ICD-10-CM | POA: Insufficient documentation

## 2023-12-13 DIAGNOSIS — I447 Left bundle-branch block, unspecified: Secondary | ICD-10-CM | POA: Insufficient documentation

## 2023-12-13 DIAGNOSIS — E1122 Type 2 diabetes mellitus with diabetic chronic kidney disease: Secondary | ICD-10-CM | POA: Diagnosis not present

## 2023-12-13 DIAGNOSIS — G4733 Obstructive sleep apnea (adult) (pediatric): Secondary | ICD-10-CM | POA: Diagnosis not present

## 2023-12-13 HISTORY — DX: Cerebral infarction, unspecified: I63.9

## 2023-12-13 LAB — BASIC METABOLIC PANEL WITH GFR
Anion gap: 6 (ref 5–15)
BUN: 37 mg/dL — ABNORMAL HIGH (ref 8–23)
CO2: 26 mmol/L (ref 22–32)
Calcium: 9 mg/dL (ref 8.9–10.3)
Chloride: 105 mmol/L (ref 98–111)
Creatinine, Ser: 1.48 mg/dL — ABNORMAL HIGH (ref 0.61–1.24)
GFR, Estimated: 47 mL/min — ABNORMAL LOW (ref >60)
Glucose, Bld: 106 mg/dL — ABNORMAL HIGH (ref 70–99)
Potassium: 4.2 mmol/L (ref 3.5–5.1)
Sodium: 137 mmol/L (ref 135–145)

## 2023-12-13 LAB — CBC
HCT: 39 % (ref 39.0–52.0)
Hemoglobin: 12.7 g/dL — ABNORMAL LOW (ref 13.0–17.0)
MCH: 32.6 pg (ref 26.0–34.0)
MCHC: 32.6 g/dL (ref 30.0–36.0)
MCV: 100.3 fL — ABNORMAL HIGH (ref 80.0–100.0)
Platelets: 178 10*3/uL (ref 150–400)
RBC: 3.89 MIL/uL — ABNORMAL LOW (ref 4.22–5.81)
RDW: 12.8 % (ref 11.5–15.5)
WBC: 5.7 10*3/uL (ref 4.0–10.5)
nRBC: 0 % (ref 0.0–0.2)

## 2023-12-13 LAB — HEMOGLOBIN A1C
Hgb A1c MFr Bld: 6 % — ABNORMAL HIGH (ref 4.8–5.6)
Mean Plasma Glucose: 125.5 mg/dL

## 2023-12-13 LAB — GLUCOSE, CAPILLARY: Glucose-Capillary: 105 mg/dL — ABNORMAL HIGH (ref 70–99)

## 2023-12-13 LAB — SURGICAL PCR SCREEN
MRSA, PCR: NEGATIVE
Staphylococcus aureus: NEGATIVE

## 2023-12-13 NOTE — Telephone Encounter (Signed)
 Patient has follow-up with Dr. Alvenia Aus tomorrow, though this is for PAD f/u. Recently saw Ronald Cockayne in clinic 11/16/23 with extremely complex PMH. Will see if Dr. Alvenia Aus would be able to provide a pre-operative recommendation at [facility] tomorrow and if not, can circle back to [facility] to review. Dr. Alvenia Aus - please let us  know your thoughts at P CV DIV PREOP when you see him. Also recommended to hold Plavix , last PCI by you 12/2022.

## 2023-12-13 NOTE — Telephone Encounter (Signed)
   Pre-operative Risk Assessment    Patient Name: [name]  DOB: May 26, 1941 MRN: [id number]   Date of last office visit: 11/16/23 SHERI HAMMOCK, NP Date of next office visit: 12/14/23 Antionette Kirks, MD   Request for Surgical Clearance    Procedure:   CYSTOSCOPY WITH BLADDER BIOPSY  Date of Surgery:  Clearance 01/16/24                                Surgeon:  DR Darlynn Elam, MD Surgeon's Group or Practice Name:  [facility] Phone number:  [phone] Fax number:  [phone]   Type of Clearance Requested:   - Medical  - Pharmacy:  Hold Clopidogrel  (Plavix ) (PER DR. Cherylene Corrente PT MAY REMAIN ON ASPIRIN )   Type of Anesthesia:  General    Additional requests/questions:    SignedCollin Deal   12/13/2023, 9:37 AM

## 2023-12-14 ENCOUNTER — Encounter: Payer: Self-pay | Admitting: Cardiovascular Disease

## 2023-12-14 ENCOUNTER — Ambulatory Visit: Payer: HMO | Attending: Cardiovascular Disease | Admitting: Cardiovascular Disease

## 2023-12-14 ENCOUNTER — Other Ambulatory Visit: Payer: Self-pay

## 2023-12-14 VITALS — BP 118/68 | HR 85 | Ht 72.0 in | Wt 208.4 lb

## 2023-12-14 DIAGNOSIS — E785 Hyperlipidemia, unspecified: Secondary | ICD-10-CM | POA: Diagnosis not present

## 2023-12-14 DIAGNOSIS — I2510 Atherosclerotic heart disease of native coronary artery without angina pectoris: Secondary | ICD-10-CM | POA: Diagnosis not present

## 2023-12-14 DIAGNOSIS — I10 Essential (primary) hypertension: Secondary | ICD-10-CM | POA: Diagnosis not present

## 2023-12-14 DIAGNOSIS — Z01810 Encounter for preprocedural cardiovascular examination: Secondary | ICD-10-CM

## 2023-12-14 DIAGNOSIS — I739 Peripheral vascular disease, unspecified: Secondary | ICD-10-CM | POA: Diagnosis not present

## 2023-12-14 NOTE — Patient Instructions (Signed)
 Medication Instructions:  STOP the Plavix   *If you need a refill on your cardiac medications before your next appointment, please call your pharmacy*  Lab Work: None ordered If you have labs (blood work) drawn today and your tests are completely normal, you will receive your results only by: MyChart Message (if you have MyChart) OR A paper copy in the mail If you have any lab test that is abnormal or we need to change your treatment, we will call you to review the results.  Testing/Procedures: None ordered  Follow-Up: At [facility], you and your health needs are our priority.  As part of our continuing mission to provide you with exceptional heart care, our providers are all part of one team.  This team includes your primary Cardiologist (physician) and Advanced Practice Providers or APPs (Physician Assistants and Nurse Practitioners) who all work together to provide you with the care you need, when you need it.  Your next appointment:   12 month(s)  Provider:   Dr. Arida

## 2023-12-14 NOTE — Telephone Encounter (Signed)
Done. See my note.

## 2023-12-14 NOTE — Progress Notes (Signed)
 Cardiology Office Note   Date:  12/14/2023   ID:  [name], DOB 02/01/1941, MRN [id number]  PCP:  [name] Crick, MD  Cardiologist:  Dr. Gollan   No chief complaint on file.      History of Present Illness: [name] is a 83 y.o. male who is here today for a follow-up visit regarding peripheral arterial disease and preop cardiovascular evaluation for right shoulder surgery. The patient has known history of coronary artery disease status post CABG in 1990, PCI of the left circumflex in 2002, DVT in the left lower extremity, hyperlipidemia and back surgery for spinal stenosis in 2017.  He had hip replacement x 2. 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  He was seen in early 2019 for peripheral arterial disease. Lower extremity arterial Doppler showed noncompressible arteries on the right side with normal toe brachial index and biphasic waveform.  Left ABI was normal with biphasic waveforms. Lower extremity arterial duplex showed evidence of bilateral SFA disease. His symptoms were felt to be multifactorial due to PAD, arthritis and severe peripheral neuropathy.  He had worsening bilateral leg pain in 2020.  Angiography in January of 2020 showed no significant aortoiliac disease.  On the right side, there was moderate to severely calcified SFA with diffuse disease especially in the midsegment and three-vessel runoff below the knee.  On the left, there was borderline significant SFA disease which was also calcified with three-vessel runoff below the knee.  I felt that his symptoms were out of proportion to his angiogram findings. The patient was hospitalized in October 2020 with COVID-19 pneumonia and had multiple complications.    He had back surgery done in November 2021 with significant improvement in his back pain and leg pain.   He was diagnosed with bladder cancer last year and was treated with  surgery.  He completed a 6-week course of BCG.  He had worsening chest pain last year.  Cardiac catheterization was done which showed left dominant coronary arteries with severe three-vessel coronary artery disease.  His grafts were patent but there was severe stenosis in the mid to distal left circumflex.  Ejection fraction was moderately reduced.  I performed successful angioplasty and drug-eluting stent placement to the left circumflex.  He had a repeat echocardiogram in January of this year which showed an EF of 40 to 45%.  He was switched from losartan  to Entresto  but did not tolerate the medication.  He has been doing reasonably well from a cardiac standpoint with no recurrent chest pain.  He reports mild exertional dyspnea.  He continues to complain of fatigue in his legs but no true pain.  He is scheduled for right shoulder surgery in the near future.  He reports extensive bruising with dual antiplatelet therapy.  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 06/2019 Echo (in setting of COVID): EF 35-40%, mild LVH, g1 DD, glob HK; b. 09/2019 Echo: EF 50-55%, Gr1 DD; c. 03/2021 Echo: EF 50-55%, no rwma, mild LVH, GrI DD, nl RV fxn. RVSP 44.62mmHg. Mild-mod dil LA. Triv MR. Mild-mod Ao sclerosis.   Actinic keratosis    Anginal pain (HCC)    Aortic ectasia, abdominal (HCC)    a.) CT abd 01/01/2022: 2.9 cm infrarenal abdominal aortic ectasia   Bladder cancer (HCC) 2023   BPH (benign prostatic hyperplasia) 09/06/2007  CAD s/p CABG    a.) 1990 s/p MI--> CABG x 3; b.) LHC/PCI 2002 --> BMS (unk type) to LCX; c.) LHC 07/07/2015: 50% oLM-LM, 80% oRCA, 80% pRCA-1, 80% pRCA-2, 60% dLAD, 100% o-pLAD, 60% mLAD, 50% D2, LIMA-LAD, SVG-D2, SVG-RI --> med Rx; d.) MV 06/05/2020: fixed apical ant/apical defect. No sign ischemia; e.) MV 09/08/202: EF 52%, no ischemia.   Carotid arterial disease (HCC)    a.) 08/2016 Carotid U/S: <39% bilat; b.) carotid  doppler 10/12/2022: mild-to-moderate (<50%) bilateral bifurcation plaque.   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   CKD (chronic kidney disease), stage III (HCC)    Community acquired pneumonia of right lower lobe of lung 06/05/2017   Diverticulosis    Dupuytren's contracture of right hand 10/29/2008   DVT (deep venous thrombosis) (HCC) 1998   Elevated prostate specific antigen (PSA) 07/09/2008   Emphysema of lung (HCC)    GERD 04/30/2007   Heart murmur    History of 2019 novel coronavirus disease (COVID-19) 05/20/2019   History of hiatal hernia    History of shingles    HLD (hyperlipidemia) 04/26/2007   HTN (hypertension) 04/30/2007   Laceration of skin of left hand 03/08/2018   LBBB (left bundle branch block)    Lumbar disc disease with radiculopathy    Migraines    Myocardial infarction (HCC) 1990   a.) resulted in 3v CABG   Myocardial infarction (HCC) 2002   a.) second cardiac event; PCI with placement of BMS (unknown type) to LCx   Neuropathy of both feet    OSA on CPAP    Osteoarthritis    PAD (peripheral artery disease) (HCC)    a. 07/2017 LE duplex: RSFA 75-25m, LSFA 75-9m, 50-74d; c. 08/2018 Periph Angio: No signif AoIliac dzs. Mod-sev Ca2+ RSFA w/ diff dzs throughout- 3 vessel runoff. Borderline signif LSFA dzs w/ mod-sev Ca2+ vessels and 3 vessel runoff below the knee-->Med rx.   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   Right mid dorsum forearm - in situ - EDC   Stroke (HCC)    left eye    Past Surgical History:  Procedure Laterality Date   ABDOMINAL AORTOGRAM W/LOWER EXTREMITY [name] 09/12/2018   Procedure: ABDOMINAL AORTOGRAM W/LOWER EXTREMITY;  Surgeon: Wenona Hamilton, MD;  Location: [facility];  Service: Cardiovascular;  Laterality: [name];   ANTERIOR CERVICAL DECOMP/DISCECTOMY FUSION [name] 07/11/2022   Anterior Cervical Decompression Fusion ,Interboy Prothesis,Plate/Screws , Cervical four-five,Cervical five-six Larrie Po, Susana Enter, MD)   APPENDECTOMY     rupture   BACK SURGERY     1984 and then another one [facility]   BLADDER INSTILLATION [name] 10/19/2021   Procedure: BLADDER INSTILLATION OF GEMCITABINE ;  Surgeon: Geraline Knapp, MD;  Location: [facility];  Service: Urology;  Laterality: [name];   BLADDER INSTILLATION [name] 11/15/2022   Procedure: BLADDER INSTILLATION OF GEMCITABINE ;  Surgeon: Geraline Knapp, MD;  Location: [facility];  Service: Urology;  Laterality: [name];   CARDIAC CATHETERIZATION [name] 07/07/2015   Procedure: Left Heart Cath and Cors/Grafts Angiography;  Surgeon: Devorah Fonder, MD;  Location: [facility];  Service: Cardiovascular;  Laterality: [name];   CATARACT EXTRACTION,  BILATERAL  2009   CERVICAL SPINE SURGERY  12/2016   cervical stenosis Larrie Po)   COLONOSCOPY  03/2010   HP polyp, diverticulosis, rpt 10 yrs ([name])   CORONARY ANGIOPLASTY  11/30/2000   LCX   CORONARY ARTERY BYPASS GRAFT  1990   3 vessel    CORONARY STENT INTERVENTION [name] 01/13/2023   Procedure: CORONARY STENT INTERVENTION;  Surgeon: Wenona Hamilton, MD;  Location: [facility];  Service: Cardiovascular;  Laterality: [name];   CYSTOSCOPY  12/23/2010   [facility]   JOINT REPLACEMENT     KNEE ARTHROSCOPY Right    x2   LAMINOTOMY  1986   L5/S1 lumbar laminotomy for two ruptured discs/fusion   LUMBAR LAMINECTOMY/DECOMPRESSION MICRODISCECTOMY [name] 07/13/2016   Procedure: LUMBAR TWO-THREE, LUMBAR THREE-FOUR, LUMBAR FOUR-FIVE LAMINECTOMY AND FORAMINOTOMY;  Surgeon: Garry Kansas, MD;  Location: [facility];  Service: Neurosurgery;  Laterality: [name];  LAMINECTOMY AND FORAMINOTOMY L2-L3, L3-L4,L4-L5   LUMBAR SPINE SURGERY  06/2020   Bilateral redo  laminectomy/laminotomy/foraminotomies/medial facetectomy to decompress the bilateral L4 and L5 nerve roots ([facility])   RIGHT/LEFT HEART CATH AND CORONARY ANGIOGRAPHY Bilateral 01/13/2023   Procedure: RIGHT/LEFT HEART CATH AND CORONARY ANGIOGRAPHY;  Surgeon: Wenona Hamilton, MD;  Location: [facility];  Service: Cardiovascular;  Laterality: Bilateral;   TOTAL HIP ARTHROPLASTY Bilateral 1999,2000   TOTAL KNEE ARTHROPLASTY Right 03/06/2017   Procedure: RIGHT TOTAL KNEE ARTHROPLASTY;  Surgeon: Liliane Rei, MD;  Location: [facility];  Service: Orthopedics;  Laterality: Right;   TRANSURETHRAL RESECTION OF BLADDER TUMOR [name] 10/19/2021   Procedure: TRANSURETHRAL RESECTION OF BLADDER TUMOR (TURBT);  Surgeon: Geraline Knapp, MD;  Location: [facility];  Service: Urology;  Laterality: [name];   TRANSURETHRAL RESECTION OF BLADDER TUMOR [name] 11/15/2022   Procedure: TRANSURETHRAL RESECTION OF BLADDER TUMOR (TURBT);  Surgeon: Geraline Knapp, MD;  Location: [facility];  Service: Urology;  Laterality: [name];     Current Outpatient Medications  Medication Sig Dispense Refill   albuterol  (PROVENTIL ) (2.5 MG/3ML) 0.083% nebulizer solution TAKE 3 MLS BY NEBULIZATION EVERY 6 HOURSAS NEEDED FOR WHEEZING OR SHORTNESS OF BREATH 75 mL 12   albuterol  (VENTOLIN  HFA) 108 (90 Base) MCG/ACT inhaler TAKE 2 PUFFS INTO LUNGS EVERY 6 HOURS ASNEEDED FOR WHEEZING OR SHORTNESS OF BREATH 8.5 each 3   amoxicillin  (AMOXIL ) 500 MG capsule TAKE 4 CAPSULES 30-60 MINUTES PRIOR TO PROCEDURE 4 capsule 1   aspirin  EC 81 MG EC tablet Take 1 tablet (81 mg total) by mouth daily. 30 tablet 0   atorvastatin  (LIPITOR) 20 MG tablet TAKE 1 TABLET BY MOUTH DAILY 90 tablet 3   carvedilol  (COREG ) 3.125 MG tablet TAKE 1 TABLET BY MOUTH TWICE DAILY 180 tablet 2   Cholecalciferol (VITAMIN D3) 25 MCG (1000 UT) CAPS Take 1 capsule (1,000 Units total) by mouth 2 (two) times a week.     Cyanocobalamin  (VITAMIN B-12 ER PO) Take 1 capsule by mouth daily.      cyclobenzaprine  (FLEXERIL ) 5 MG tablet Take 1 tablet (5 mg total) by mouth 3 (three) times daily as needed for muscle spasms. 30 tablet 0   docusate sodium  (COLACE) 100 MG capsule Take 1 capsule (100 mg total) by mouth daily as needed for mild constipation.     Dupilumab (DUPIXENT) 300 MG/2ML SOAJ Inject 300 mg into the skin every 14 (fourteen) days. **patient completed loading dose in clinic** 12 mL 1   ezetimibe  (ZETIA ) 10 MG tablet TAKE 1 TABLET BY MOUTH DAILY 90 tablet 0   famotidine  (PEPCID )  20 MG tablet Take 20 mg by mouth daily.     fluticasone  (FLONASE ) 50 MCG/ACT nasal spray Place 1 spray into both nostrils daily as needed for allergies or rhinitis.     Fluticasone -Umeclidin-Vilant (TRELEGY ELLIPTA ) 200-62.5-25 MCG/ACT AEPB Inhale 1 puff into the lungs daily. 180 each 3   furosemide  (LASIX ) 40 MG tablet Take 0.5 tablets (20 mg total) by mouth daily as needed. Take 1 tablet (40 mg) daily as need for weight greater then 208. 180 tablet 3   gabapentin  (NEURONTIN ) 600 MG tablet Take 1 tablet (600 mg total) by mouth 3 (three) times daily. 270 tablet 4   HYDROcodone -acetaminophen  (NORCO/VICODIN) 5-325 MG tablet Take 1 tablet by mouth 3 (three) times daily as needed for moderate pain (pain score 4-6). 15 tablet 0   hydrocortisone  2.5 % cream Apply topically 2 (two) times daily as needed (Rash). 30 g 5   isosorbide  mononitrate (IMDUR ) 30 MG 24 hr tablet Take 1 tablet (30 mg total) by mouth daily. 90 tablet 3   losartan  (COZAAR ) 25 MG tablet Take 1 tablet (25 mg total) by mouth daily. 90 tablet 3   montelukast  (SINGULAIR ) 10 MG tablet TAKE 1 TABLET BY MOUTH DAILY 30 tablet 11   mupirocin  ointment (BACTROBAN ) 2 % Apply 1 Application topically 2 (two) times daily. 22 g 0   nitrofurantoin , macrocrystal-monohydrate, (MACROBID ) 100 MG capsule Take 1 capsule (100 mg total) by mouth 2 (two) times daily. 14 capsule 0   nitroGLYCERIN  (NITROSTAT ) 0.4 MG SL tablet Place 1 tablet (0.4 mg total) under the tongue  every 5 (five) minutes as needed for chest pain. 25 tablet 3   polyethylene glycol (MIRALAX  / GLYCOLAX ) 17 g packet Take 17 g by mouth daily as needed for mild constipation.      ranolazine  (RANEXA ) 500 MG 12 hr tablet TAKE ONE (1) TABLET BY MOUTH TWO TIMES PER DAY 180 tablet 3   spironolactone  (ALDACTONE ) 25 MG tablet Take 0.5 tablets (12.5 mg total) by mouth daily. 45 tablet 3   tamsulosin  (FLOMAX ) 0.4 MG CAPS capsule TAKE 1 CAPSULE BY MOUTH AT BEDTIME 90 capsule 1   No current facility-administered medications for this visit.    Allergies:   Vioxx [rofecoxib], Spiriva  respimat [tiotropium bromide monohydrate ], Contrast media [iodinated contrast media], and Morphine     Social History:  The patient  reports that he quit smoking about 35 years ago. His smoking use included cigarettes. He started smoking about 60 years ago. He has a 25 pack-year smoking history. He has been exposed to tobacco smoke. He has never used smokeless tobacco. He reports that he does not drink alcohol and does not use drugs.   Family History:  The patient's family history includes Cancer in his sister and another family member; Diabetes in his brother and mother; Heart attack in his mother; Heart disease in his father; Polycystic kidney disease in his father and sister; Stroke in his father and mother.    ROS:  Please see the history of present illness.   Otherwise, review of systems are positive for none.   All other systems are reviewed and negative.    PHYSICAL EXAM: VS:  BP 118/68 (BP Location: Left Arm, Patient Position: Sitting, Cuff Size: Normal)   Pulse 85   Ht 6' (1.829 m)   Wt 208 lb 6.4 oz (94.5 kg)   SpO2 95%   BMI 28.26 kg/m  , BMI Body mass index is 28.26 kg/m. GEN: Well nourished, well developed, in no acute distress  HEENT: normal  Neck: no JVD, carotid bruits, or masses Cardiac: RRR; no  rubs, or gallops,no edema .  2/ 6 systolic ejection murmur in the aortic area Respiratory:  clear to  auscultation bilaterally, normal work of breathing GI: soft, nontender, nondistended, + BS MS: no deformity or atrophy  Skin: warm and dry, no rash Neuro:  Strength and sensation are intact Psych: euthymic mood, full affect Vascular: Femoral pulses are normal.  Distal pulses are nonpalpable   EKG:  EKG is  ordered today. EKG showed normal sinus rhythm with left anterior fascicular block.  Poor R wave progression in the anterior leads.  Recent Labs: 02/28/2023: ALT 16 12/13/2023: BUN 37; Creatinine, Ser 1.48; Hemoglobin 12.7; Platelets 178; Potassium 4.2; Sodium 137    Lipid Panel    Component Value Date/Time   CHOL 161 08/11/2023 1055   TRIG 132 08/11/2023 1055   HDL 41 08/11/2023 1055   CHOLHDL 3.9 08/11/2023 1055   CHOLHDL 3 02/28/2023 0911   VLDL 13.8 02/28/2023 0911   LDLCALC 96 08/11/2023 1055   LDLDIRECT 106.0 01/01/2016 0844      Wt Readings from Last 3 Encounters:  12/14/23 208 lb 6.4 oz (94.5 kg)  12/13/23 206 lb (93.4 kg)  12/06/23 203 lb (92.1 kg)          No data to display            ASSESSMENT AND PLAN:  1.  Peripheral arterial disease: The patient does have bilateral calcified SFA disease worse on the right side.  Most of his symptoms are related to neuropathy and spine issues.  No convincing symptoms of calf claudication.  Continue medical therapy.  2.  Coronary artery disease involving native coronary arteries without angina: Status post PCI and drug-eluting stent placement to the left circumflex in May 2024.  Given extensive bruising, I elected to discontinue clopidogrel .  Continue aspirin  81 mg daily.  3.  Hyperlipidemia: Continue treatment with atorvastatin  and ezetimibe .  Consider increasing the dose of atorvastatin  given that his LDL is not at target.  4.  Essential hypertension: Blood pressure is controlled.  5.  Preop cardiovascular evaluation for shoulder surgery: The patient has no anginal symptoms and remains stable from a cardiac  standpoint and spite of complex cardiovascular history.  He can proceed with surgery at an overall moderate risk.  Plavix  was discontinued today with no need to resume postop.  He should continue to take aspirin  81 mg daily without interruption.      Disposition:   FU with me in 12 months for PAD.  Continue to follow-up with Dr. Gollan as planned.  Signed, Antionette Kirks, MD 12/14/23 [facility] [place], [place] [phone]

## 2023-12-14 NOTE — Telephone Encounter (Signed)
 Note and recommendations faxed to requesting office, will remove from preop pool at this time.

## 2023-12-15 NOTE — Progress Notes (Signed)
 Anesthesia Chart Review   Case: [id number] Date/Time: 12/21/23 0715   Procedure: ARTHROPLASTY, SHOULDER, TOTAL, REVERSE (Right: Shoulder)   Anesthesia type: General   Diagnosis: Rotator cuff tear arthropathy of right shoulder [M75.101, M12.811]   Pre-op diagnosis: Right Rotator Cuff Tear   Location: [facility]OM 06 / [facility]   Surgeons: Ellard Gunning, MD       DISCUSSION:82 y.o. former smoker with h/o HTN, OSA on CPAP, LBBB, CAD s/p CABG 1990, DES to left circumflex 2024, CHF, CKD Stage III, right rotator cuff tear scheduled for above procedure 12/21/2023 with Dr. Ellard Gunning.    Pt seen by cardiology 12/14/23. Per OV note, "Preop cardiovascular evaluation for shoulder surgery: The patient has no anginal symptoms and remains stable from a cardiac standpoint and spite of complex cardiovascular history.  He can proceed with surgery at an overall moderate risk.  Plavix  was discontinued today with no need to resume postop.  He should continue to take aspirin  81 mg daily without interruption."  Pt reports to PAT nurse last dose of Plavix  12/14/2023.   VS: BP (!) 135/54   Pulse 70   Temp 36.5 C (Oral)   Resp 16   Ht 6' (1.829 m)   Wt 93.4 kg   SpO2 96%   BMI 27.94 kg/m   PROVIDERS: Claire Crick, MD is PCP   Antionette Kirks, MD is Cardiologist  LABS: Labs reviewed: Acceptable for surgery. (all labs ordered are listed, but only abnormal results are displayed)  Labs Reviewed  HEMOGLOBIN A1C - Abnormal; Notable for the following components:      Result Value   Hgb A1c MFr Bld 6.0 (*)    All other components within normal limits  BASIC METABOLIC PANEL WITH GFR - Abnormal; Notable for the following components:   Glucose, Bld 106 (*)    BUN 37 (*)    Creatinine, Ser 1.48 (*)    GFR, Estimated 47 (*)    All other components within normal limits  CBC - Abnormal; Notable for the following components:   RBC 3.89 (*)    Hemoglobin 12.7 (*)    MCV 100.3 (*)    All other components within  normal limits  GLUCOSE, CAPILLARY - Abnormal; Notable for the following components:   Glucose-Capillary 105 (*)    All other components within normal limits  SURGICAL PCR SCREEN     IMAGES:   EKG:   CV: Echo 09/05/23 1. Left ventricular ejection fraction, by estimation, is 40 to 45%. The  left ventricle has mildly decreased function. The left ventricle  demonstrates global hypokinesis. Left ventricular diastolic parameters are  consistent with Grade I diastolic  dysfunction (impaired relaxation). The global longitudinal strain is  abnormal.   2. Right ventricular systolic function is normal. The right ventricular  size is normal.   3. Left atrial size was mildly dilated.   4. The mitral valve is normal in structure. Mild mitral valve  regurgitation. No evidence of mitral stenosis.   5. The aortic valve is normal in structure. Aortic valve regurgitation is  not visualized. Aortic valve sclerosis is present, with no evidence of  aortic valve stenosis.   6. The inferior vena cava is normal in size with greater than 50%  respiratory variability, suggesting right atrial pressure of 3 mmHg.  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 06/2019 Echo (in setting of COVID): EF 35-40%, mild LVH, g1 DD, glob  HK; b. 09/2019 Echo: EF 50-55%, Gr1 DD; c. 03/2021 Echo: EF 50-55%, no rwma, mild LVH, GrI DD, nl RV fxn. RVSP 44.71mmHg. Mild-mod dil LA. Triv MR. Mild-mod Ao sclerosis.   Actinic keratosis    Anginal pain (HCC)    Aortic ectasia, abdominal (HCC)    a.) CT abd 01/01/2022: 2.9 cm infrarenal abdominal aortic ectasia   Bladder cancer (HCC) 2023   BPH (benign prostatic hyperplasia) 09/06/2007   CAD s/p CABG    a.) 1990 s/p MI--> CABG x 3; b.) LHC/PCI 2002 --> BMS (unk type) to LCX; c.) LHC 07/07/2015: 50% oLM-LM, 80% oRCA, 80% pRCA-1, 80% pRCA-2, 60% dLAD, 100% o-pLAD, 60% mLAD, 50% D2, LIMA-LAD, SVG-D2, SVG-RI --> med Rx;  d.) MV 06/05/2020: fixed apical ant/apical defect. No sign ischemia; e.) MV 09/08/202: EF 52%, no ischemia.   Carotid arterial disease (HCC)    a.) 08/2016 Carotid U/S: <39% bilat; b.) carotid doppler 10/12/2022: mild-to-moderate (<50%) bilateral bifurcation plaque.   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   CKD (chronic kidney disease), stage III (HCC)    Community acquired pneumonia of right lower lobe of lung 06/05/2017   Diverticulosis    Dupuytren's contracture of right hand 10/29/2008   DVT (deep venous thrombosis) (HCC) 1998   Elevated prostate specific antigen (PSA) 07/09/2008   Emphysema of lung (HCC)    GERD 04/30/2007   Heart murmur    History of 2019 novel coronavirus disease (COVID-19) 05/20/2019   History of hiatal hernia    History of shingles    HLD (hyperlipidemia) 04/26/2007   HTN (hypertension) 04/30/2007   Laceration of skin of left hand 03/08/2018   LBBB (left bundle branch block)    Lumbar disc disease with radiculopathy    Migraines    Myocardial infarction (HCC) 1990   a.) resulted in 3v CABG   Myocardial infarction (HCC) 2002   a.) second cardiac event; PCI with placement of BMS (unknown type) to LCx   Neuropathy of both feet    OSA on CPAP    Osteoarthritis    PAD (peripheral artery disease) (HCC)    a. 07/2017 LE duplex: RSFA 75-27m, LSFA 75-58m, 50-74d; c. 08/2018 Periph Angio: No signif AoIliac dzs. Mod-sev Ca2+ RSFA w/ diff dzs throughout- 3 vessel runoff. Borderline signif LSFA dzs w/ mod-sev Ca2+ vessels and 3 vessel runoff below the knee-->Med rx.   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   Right mid dorsum forearm - in situ - EDC   Stroke (HCC)    left  eye    Past Surgical History:  Procedure Laterality Date   ABDOMINAL AORTOGRAM W/LOWER EXTREMITY [name] 09/12/2018   Procedure: ABDOMINAL AORTOGRAM W/LOWER EXTREMITY;  Surgeon: Wenona Hamilton, MD;  [facility]: [facility];  Service: Cardiovascular;  Laterality: [name];   ANTERIOR CERVICAL DECOMP/DISCECTOMY FUSION [name] 07/11/2022   Anterior Cervical Decompression Fusion ,Interboy Prothesis,Plate/Screws , Cervical four-five,Cervical five-six Larrie Po, Susana Enter, MD)   APPENDECTOMY     rupture   BACK SURGERY     1984 and then another one at [facility]   BLADDER INSTILLATION [name] 10/19/2021   Procedure: BLADDER INSTILLATION OF GEMCITABINE ;  Surgeon: Geraline Knapp,  MD;  Location: [facility];  Service: Urology;  Laterality: [name];   BLADDER INSTILLATION [name] 11/15/2022   Procedure: BLADDER INSTILLATION OF GEMCITABINE ;  Surgeon: Geraline Knapp, MD;  Location: [facility];  Service: Urology;  Laterality: [name];   CARDIAC CATHETERIZATION [name] 07/07/2015   Procedure: Left Heart Cath and Cors/Grafts Angiography;  Surgeon: Devorah Fonder, MD;  Location: [facility];  Service: Cardiovascular;  Laterality: [name];   CATARACT EXTRACTION, BILATERAL  2009   CERVICAL SPINE SURGERY  12/2016   cervical stenosis Larrie Po)   COLONOSCOPY  03/2010   HP polyp, diverticulosis, rpt 10 yrs ([name])   CORONARY ANGIOPLASTY  11/30/2000   LCX   CORONARY ARTERY BYPASS GRAFT  1990   3 vessel    CORONARY STENT INTERVENTION [name] 01/13/2023   Procedure: CORONARY STENT INTERVENTION;  Surgeon: Wenona Hamilton, MD;  Location: [facility];  Service: Cardiovascular;  Laterality: [name];   CYSTOSCOPY  12/23/2010   [facility]   JOINT REPLACEMENT     KNEE ARTHROSCOPY Right    x2   LAMINOTOMY  1986   L5/S1 lumbar laminotomy for two ruptured discs/fusion   LUMBAR LAMINECTOMY/DECOMPRESSION MICRODISCECTOMY [name] 07/13/2016   Procedure: LUMBAR TWO-THREE, LUMBAR THREE-FOUR, LUMBAR FOUR-FIVE LAMINECTOMY AND FORAMINOTOMY;  Surgeon: Garry Kansas, MD;  Location: [facility];  Service: Neurosurgery;  Laterality: [name];  LAMINECTOMY AND FORAMINOTOMY L2-L3, L3-L4,L4-L5   LUMBAR SPINE SURGERY  06/2020   Bilateral redo laminectomy/laminotomy/foraminotomies/medial facetectomy to decompress the bilateral L4 and L5 nerve roots ([place])   RIGHT/LEFT HEART CATH AND CORONARY ANGIOGRAPHY Bilateral 01/13/2023   Procedure: RIGHT/LEFT HEART CATH AND CORONARY ANGIOGRAPHY;  Surgeon: Wenona Hamilton, MD;  Location: [facility];  Service: Cardiovascular;  Laterality: Bilateral;   TOTAL HIP ARTHROPLASTY Bilateral 1999,2000   TOTAL KNEE ARTHROPLASTY Right 03/06/2017   Procedure: RIGHT TOTAL KNEE ARTHROPLASTY;  Surgeon: Liliane Rei, MD;  Location: [facility];  Service: Orthopedics;  Laterality: Right;   TRANSURETHRAL RESECTION OF BLADDER TUMOR [name] 10/19/2021   Procedure: TRANSURETHRAL RESECTION OF BLADDER TUMOR (TURBT);  Surgeon: Geraline Knapp, MD;  Location: [facility];  Service: Urology;  Laterality: [name];   TRANSURETHRAL RESECTION OF BLADDER TUMOR [name] 11/15/2022   Procedure: TRANSURETHRAL RESECTION OF BLADDER TUMOR (TURBT);  Surgeon: Geraline Knapp, MD;  Location: [facility];  Service: Urology;  Laterality: [name];    MEDICATIONS:  albuterol  (PROVENTIL ) (2.5 MG/3ML) 0.083% nebulizer solution   albuterol  (VENTOLIN  HFA) 108 (90 Base) MCG/ACT inhaler   amoxicillin  (AMOXIL ) 500 MG capsule   aspirin  EC 81 MG EC tablet   atorvastatin  (LIPITOR) 20 MG tablet   carvedilol  (COREG ) 3.125 MG tablet   Cholecalciferol (VITAMIN D3) 25 MCG (1000 UT) CAPS   Cyanocobalamin  (VITAMIN B-12 ER PO)   cyclobenzaprine  (FLEXERIL ) 5 MG tablet   docusate sodium  (COLACE) 100 MG capsule   Dupilumab (DUPIXENT) 300 MG/2ML SOAJ   ezetimibe  (ZETIA ) 10 MG tablet   famotidine  (PEPCID ) 20 MG tablet   fluticasone  (FLONASE ) 50 MCG/ACT nasal spray   Fluticasone -Umeclidin-Vilant (TRELEGY ELLIPTA ) 200-62.5-25 MCG/ACT AEPB   furosemide  (LASIX ) 40 MG tablet   gabapentin  (NEURONTIN )  600 MG tablet   HYDROcodone -acetaminophen  (NORCO/VICODIN) 5-325 MG tablet   hydrocortisone  2.5 % cream   isosorbide  mononitrate (IMDUR ) 30 MG 24 hr tablet   losartan  (COZAAR ) 25 MG tablet   montelukast  (SINGULAIR ) 10 MG tablet   mupirocin  ointment (BACTROBAN ) 2 %   nitrofurantoin , macrocrystal-monohydrate, (MACROBID ) 100 MG capsule   nitroGLYCERIN  (NITROSTAT ) 0.4 MG SL tablet   polyethylene glycol (  MIRALAX  / GLYCOLAX ) 17 g packet   ranolazine  (RANEXA ) 500 MG 12 hr tablet   spironolactone  (ALDACTONE ) 25 MG tablet   tamsulosin  (FLOMAX ) 0.4 MG CAPS capsule   No current facility-administered medications for this encounter.    Chick Cotton Ward, PA-C [facility] [phone]

## 2023-12-15 NOTE — Anesthesia Preprocedure Evaluation (Signed)
 Anesthesia Evaluation  Patient identified by MRN, date of birth, ID band Patient awake    Reviewed: Allergy & Precautions, NPO status , Patient's Chart, lab work & pertinent test results, reviewed documented beta blocker date and time   History of Anesthesia Complications Negative for: history of anesthetic complications  Airway Mallampati: III  TM Distance: >3 FB     Dental  (+) Missing, Edentulous Upper   Pulmonary asthma , sleep apnea , pneumonia, COPD, former smoker   breath sounds clear to auscultation       Cardiovascular hypertension, + angina  + CAD, + Past MI, + Peripheral Vascular Disease and +CHF  + dysrhythmias (-) pacemaker(-) Cardiac Defibrillator + Valvular Problems/Murmurs  Rhythm:Regular Rate:Normal  IMPRESSIONS     1. Left ventricular ejection fraction, by estimation, is 40 to 45%. The  left ventricle has mildly decreased function. The left ventricle  demonstrates global hypokinesis. Left ventricular diastolic parameters are  consistent with Grade I diastolic  dysfunction (impaired relaxation). The global longitudinal strain is  abnormal.   2. Right ventricular systolic function is normal. The right ventricular  size is normal.   3. Left atrial size was mildly dilated.   4. The mitral valve is normal in structure. Mild mitral valve  regurgitation. No evidence of mitral stenosis.   5. The aortic valve is normal in structure. Aortic valve regurgitation is  not visualized. Aortic valve sclerosis is present, with no evidence of  aortic valve stenosis.   6. The inferior vena cava is normal in size with greater than 50%  respiratory variability, suggesting right atrial pressure of 3 mmHg.     Neuro/Psych  Headaches  Anxiety      Neuromuscular disease CVA, No Residual Symptoms    GI/Hepatic hiatal hernia,GERD  ,,(+) neg Cirrhosis        Endo/Other  diabetes, Type 2    Renal/GU CRFRenal disease      Musculoskeletal  (+) Arthritis , Osteoarthritis,    Abdominal   Peds  Hematology   Anesthesia Other Findings   Reproductive/Obstetrics                             Anesthesia Physical Anesthesia Plan  ASA: 3  Anesthesia Plan: General   Post-op Pain Management: Regional block*   Induction: Intravenous  PONV Risk Score and Plan: 2 and Ondansetron  and Dexamethasone   Airway Management Planned: Oral ETT  Additional Equipment:   Intra-op Plan:   Post-operative Plan: Extubation in OR  Informed Consent: I have reviewed the patients History and Physical, chart, labs and discussed the procedure including the risks, benefits and alternatives for the proposed anesthesia with the patient or authorized representative who has indicated his/her understanding and acceptance.     Dental advisory given  Plan Discussed with: CRNA  Anesthesia Plan Comments: (See PAT note 12/13/2023)       Anesthesia Quick Evaluation

## 2023-12-18 ENCOUNTER — Other Ambulatory Visit: Payer: Self-pay | Admitting: Physician Assistant

## 2023-12-20 ENCOUNTER — Encounter: Payer: Self-pay | Admitting: Pulmonary Disease

## 2023-12-20 ENCOUNTER — Ambulatory Visit: Payer: HMO | Admitting: Pulmonary Disease

## 2023-12-20 VITALS — BP 134/62 | HR 66 | Temp 97.6°F | Ht 72.0 in | Wt 205.0 lb

## 2023-12-20 DIAGNOSIS — I429 Cardiomyopathy, unspecified: Secondary | ICD-10-CM

## 2023-12-20 DIAGNOSIS — J4489 Other specified chronic obstructive pulmonary disease: Secondary | ICD-10-CM | POA: Diagnosis not present

## 2023-12-20 DIAGNOSIS — J4540 Moderate persistent asthma, uncomplicated: Secondary | ICD-10-CM | POA: Diagnosis not present

## 2023-12-20 LAB — NITRIC OXIDE: Nitric Oxide: 32

## 2023-12-20 NOTE — Patient Instructions (Signed)
 VISIT SUMMARY:  You had a follow-up appointment today to discuss your asthma, upcoming shoulder surgery, and bladder issues. You mentioned that your asthma is well-controlled with Dupixent, and you are preparing for your shoulder surgery. You also discussed your bladder cancer and the upcoming procedure in June. Additionally, you reported occasional issues with your CPAP machine.  YOUR PLAN:  -ASTHMA WITH ASTHMATIC BRONCHITIS: Asthma is a condition where your airways narrow and swell, producing extra mucus, which can make breathing difficult. Your asthma is well-controlled with Dupixent, and your lung inflammation has decreased. You should continue using Dupixent.    -OBSTRUCTIVE SLEEP APNEA: You report that you have had some increases pressure occurring in the middle of the night.  These are likely related to leaks. Please evaluate your CPAP machine for potential leaks and make necessary adjustments.  -BLADDER CANCER: Bladder cancer is a type of cancer that begins in the cells of the bladder. You have a few small spots of cancer and are scheduled for a procedure in June. Your current shoulder condition limits your ability to undergo certain treatments due to mobility restrictions.  INSTRUCTIONS:  Please continue with your current medications and follow the plan discussed for your asthma and bladder cancer. Ensure you attend your scheduled bladder procedure in June. We will also evaluate your CPAP machine for potential leaks and make necessary adjustments. If you have any concerns or experience any new symptoms, please contact our office.

## 2023-12-20 NOTE — Progress Notes (Signed)
 Subjective:    Patient ID: [name], male    DOB: 02-06-1941, 83 y.o.   MRN: [id number]  Patient Care Team: Claire Crick, MD as PCP - General (Family Medicine) Alta Ast, [facility] (Inactive) as Pharmacist (Pharmacist) Devorah Fonder, MD as Consulting Physician (Cardiology) Marc Senior, MD as Consulting Physician (Pulmonary Disease) Wenona Hamilton, MD as Consulting Physician (Cardiology)  Chief Complaint  Patient presents with   Follow-up    No cough, shortness of breath or wheezing.     BACKGROUND/INTERVAL: [name] is an 83 year old former smoker (25 PY) who presents for an acute visit in the setting of COVID-19 infection on 14 October 2023.  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 Patient was last seen on 24 October 2023.  Is on Dupixent having started on 29 August 2023.   HPI Discussed the use of AI scribe software for clinical note transcription with the patient, who gave verbal consent to proceed.  History of Present Illness   BLADE OKULA "[name]" is an 83 year old male with asthma and asthmatic bronchitis who presents for follow-up.  He presents with his wife.  He is feeling well and notes that Dupixent is effectively managing his asthma symptoms. He acknowledges a significant asthma component but is pleased with the medication's efficacy. Due to an upcoming shoulder surgery, he will require assistance with medication administration.  His wife will assist him in this regard.  He is scheduled for a total shoulder replacement at [facility] with Dr. Ellard Gunning. He will be in a sling post-surgery and is practicing using his left hand in preparation, as he is right-handed. He expresses some apprehension about the procedure.  He discusses his bladder issues, stating they are stable. He mentions a recurrence of cancer with a couple of small spots and is scheduled for  a procedure in June. He is unable to undergo certain treatments due to the upcoming shoulder surgery, which will limit his mobility.  He uses a CPAP machine and reports occasional issues with high pressure levels, causing air to blow out of his nostrils, likely due to a mask leak. Despite this, he remains mostly compliant with CPAP usage.     Review of Systems A 10 point review of systems was performed and it is as noted above otherwise negative.   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   OSA (obstructive sleep apnea)    Chronic heart failure with preserved ejection fraction (HFpEF) (HCC)    Right leg pain 06/22/2021   Nocturnal hypoxia 06/11/2021   CKD (chronic kidney disease) stage 3, GFR 30-59 ml/min (HCC) 02/24/2021   Peripheral neuropathy 02/23/2021   Spondylolisthesis of lumbar region 07/07/2020   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   Coccydynia 09/20/2019   Nasal sinus congestion 07/19/2019   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   Anxiety 06/04/2019   Chronic right shoulder pain 09/10/2018   Cervical stenosis of spinal canal  06/05/2017   Pedal edema 06/05/2017   Health maintenance examination 12/06/2016   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/13/2016   Overweight (BMI 25.0-29.9) 07/04/2016   Low vitamin B12 level 01/01/2016   Exertional dyspnea 07/07/2015   Unstable angina (HCC)    Medicare annual wellness visit, subsequent 07/03/2015   Advanced care  planning/counseling discussion 07/03/2015   Type 2 diabetes mellitus with other specified complication (HCC) 05/04/2015   Pleuritic chest pain 05/04/2015   Ex-smoker 05/04/2015   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   Spermatocele 04/02/2013   Syncopal vertigo 11/04/2010   Lumbar disc disease with radiculopathy 11/04/2010   Coronary artery disease involving native coronary artery of native heart with unstable angina pectoris (HCC) 08/11/2009   PAD (peripheral artery disease) (HCC) 08/11/2009   DUPUYTREN'S CONTRACTURE, RIGHT 10/29/2008   Carotid stenosis 07/09/2008   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/2008   Benign prostatic hyperplasia with urinary obstruction 09/06/2007   Essential hypertension 04/30/2007   GERD 04/30/2007   Hyperlipidemia associated with type 2 diabetes mellitus (HCC) 04/26/2007   OA (osteoarthritis) of knee 04/26/2007   DVT, HX OF 04/26/2007    Social History   Tobacco Use   Smoking status: Former    Current packs/day: 0.00    Average packs/day: 1 pack/day for 25.0 years (25.0 ttl pk-yrs)    Types: Cigarettes    Start date: 08/16/1963    Quit date: 08/15/1988    Years since quitting: 35.3    Passive exposure: Past   Smokeless tobacco: Never  Substance Use Topics   Alcohol use: No    Alcohol/week: 0.0 standard drinks of alcohol    Allergies  Allergen Reactions   Vioxx [Rofecoxib] Other (See Comments)    Hemorrhage    Spiriva  Respimat [Tiotropium Bromide Monohydrate ] Other (See Comments)    Elevated bp   Contrast Media [Iodinated Contrast Media] Itching and Rash    Delayed reaction post abdominal aortagram.    Morphine  Nausea Only and Other (See Comments)    Irritability     Current Meds  Medication Sig   albuterol  (PROVENTIL ) (2.5 MG/3ML) 0.083% nebulizer solution TAKE 3 MLS BY NEBULIZATION EVERY 6 HOURSAS NEEDED FOR WHEEZING OR SHORTNESS OF BREATH   albuterol  (VENTOLIN  HFA) 108 (90 Base) MCG/ACT inhaler TAKE 2 PUFFS INTO LUNGS EVERY 6 HOURS ASNEEDED  FOR WHEEZING OR SHORTNESS OF BREATH   amoxicillin  (AMOXIL ) 500 MG capsule TAKE 4 CAPSULES 30-60 MINUTES PRIOR TO PROCEDURE   aspirin  EC 81 MG EC tablet Take 1 tablet (81 mg total) by mouth daily.   atorvastatin  (LIPITOR) 20 MG tablet TAKE 1 TABLET BY MOUTH DAILY   carvedilol  (COREG ) 3.125 MG tablet TAKE 1 TABLET BY MOUTH TWICE DAILY   Cholecalciferol (VITAMIN D3) 25 MCG (1000 UT) CAPS Take 1 capsule (1,000 Units total) by mouth 2 (two) times a week.   Cyanocobalamin  (VITAMIN B-12 ER PO) Take 1 capsule by mouth daily.   docusate sodium  (COLACE) 100 MG capsule Take 1 capsule (100 mg total) by mouth daily as needed for mild constipation.   Dupilumab (DUPIXENT) 300 MG/2ML SOAJ Inject 300 mg into the skin every 14 (fourteen) days. **patient completed loading dose in clinic**   ezetimibe  (ZETIA ) 10 MG tablet TAKE 1 TABLET BY MOUTH DAILY   famotidine  (PEPCID ) 20 MG tablet Take 20 mg by mouth daily.   fluticasone  (FLONASE ) 50 MCG/ACT nasal spray Place 1 spray into both nostrils daily as needed for allergies or rhinitis.   Fluticasone -Umeclidin-Vilant (TRELEGY ELLIPTA ) 200-62.5-25 MCG/ACT AEPB Inhale 1 puff into the  lungs daily.   furosemide  (LASIX ) 40 MG tablet Take 0.5 tablets (20 mg total) by mouth daily as needed. Take 1 tablet (40 mg) daily as need for weight greater then 208.   gabapentin  (NEURONTIN ) 600 MG tablet Take 1 tablet (600 mg total) by mouth 3 (three) times daily.   hydrocortisone  2.5 % cream Apply topically 2 (two) times daily as needed (Rash).   isosorbide  mononitrate (IMDUR ) 30 MG 24 hr tablet TAKE 1 TABLET BY MOUTH DAILY.   losartan  (COZAAR ) 25 MG tablet Take 1 tablet (25 mg total) by mouth daily.   montelukast  (SINGULAIR ) 10 MG tablet TAKE 1 TABLET BY MOUTH DAILY   mupirocin  ointment (BACTROBAN ) 2 % Apply 1 Application topically 2 (two) times daily.   nitrofurantoin , macrocrystal-monohydrate, (MACROBID ) 100 MG capsule Take 1 capsule (100 mg total) by mouth 2 (two) times daily.    nitroGLYCERIN  (NITROSTAT ) 0.4 MG SL tablet Place 1 tablet (0.4 mg total) under the tongue every 5 (five) minutes as needed for chest pain.   polyethylene glycol (MIRALAX  / GLYCOLAX ) 17 g packet Take 17 g by mouth daily as needed for mild constipation.    ranolazine  (RANEXA ) 500 MG 12 hr tablet TAKE ONE (1) TABLET BY MOUTH TWO TIMES PER DAY   spironolactone  (ALDACTONE ) 25 MG tablet Take 0.5 tablets (12.5 mg total) by mouth daily.   tamsulosin  (FLOMAX ) 0.4 MG CAPS capsule TAKE 1 CAPSULE BY MOUTH AT BEDTIME   [DISCONTINUED] cyclobenzaprine  (FLEXERIL ) 5 MG tablet Take 1 tablet (5 mg total) by mouth 3 (three) times daily as needed for muscle spasms.   [DISCONTINUED] HYDROcodone -acetaminophen  (NORCO/VICODIN) 5-325 MG tablet Take 1 tablet by mouth 3 (three) times daily as needed for moderate pain (pain score 4-6).    Immunization History  Administered Date(s) Administered   Fluad Quad(high Dose 65+) 04/17/2019, 04/15/2022, 05/08/2023   Influenza Whole 08/15/2000, 07/04/2007, 05/09/2008, 05/31/2010   Influenza, High Dose Seasonal PF 05/15/2013, 06/25/2015, 07/05/2021, 05/08/2023   Influenza,inj,Quad PF,6+ Mos 04/17/2014, 05/18/2016, 06/07/2018   Influenza-Unspecified 05/22/2017, 05/28/2020, 08/05/2022   PFIZER(Purple Top)SARS-COV-2 Vaccination 09/07/2019, 09/28/2019, 05/12/2020, 01/19/2021   PNEUMOCOCCAL CONJUGATE-20 09/09/2023   Pneumococcal Conjugate-13 02/09/2015   Pneumococcal Polysaccharide-23 08/15/2000, 10/29/2008   Respiratory Syncytial Virus Vaccine,Recomb Aduvanted(Arexvy) 10/10/2022   Td 08/15/2005   Tdap 02/10/2018   Zoster Recombinant(Shingrix) 04/05/2017, 07/25/2017   Zoster, Live 02/04/2009        Objective:     BP 134/62 (BP Location: Left Arm, Patient Position: Sitting, Cuff Size: Normal)   Pulse 66   Temp 97.6 F (36.4 C) (Temporal)   Ht 6' (1.829 m)   Wt 205 lb (93 kg)   SpO2 98%   BMI 27.80 kg/m   SpO2: 98 %  GENERAL: This is a well-developed, overweight  gentleman, acute on chronically ill-appearing, awake, alert, no acute distress.  Fully ambulatory. HEAD: Normocephalic, atraumatic.  EYES: Pupils equal, round, reactive to light.  No scleral icterus.  MOUTH: Poor dentition, oral mucosa moist.  No thrush. NECK: Supple. No thyromegaly. Trachea midline. No JVD.  No adenopathy.  Anterior cervical fusion incision, well healed. PULMONARY: Good air entry bilaterally, no adventitious sounds. CARDIOVASCULAR: S1 and S2. Regular rate and rhythm.  Grade 1/6 systolic ejection murmur left sternal border. GASTROINTESTINAL: Protuberant abdomen, soft, no tenderness.   MUSCULOSKELETAL: No joint deformity, no clubbing, no edema.  NEUROLOGIC: No overt focal deficits noted.  Speech is fluent.   SKIN: Intact,warm,dry.  Limited exam shows no rashes.  He has multiple purpura and ecchymoses in the upper extremities. PSYCH:  Mood and behavior normal.   Lab Results  Component Value Date   NITRICOXIDE 32 12/20/2023  *Trend: 52>>41>>32 *Well-controlled type II inflammation      Assessment & Plan:     ICD-10-CM   1. Asthma-COPD overlap syndrome (HCC)  J44.89 Nitric oxide     2. Asthmatic bronchitis , chronic (HCC)  J44.89     3. Moderate persistent asthma without complication  J45.40     4. Cardiomyopathy, unspecified type (HCC)  I42.9       Orders Placed This Encounter  Procedures   Nitric oxide    Discussion:    Asthma with asthmatic bronchitis Asthma is well-controlled with Dupixent. Lung sounds are clear, and inflammation levels have decreased significantly, with nitric oxide  levels now at the high end of normal, reduced from 52 to 36. - Continue Dupixent therapy - Nitric oxide  test at 32 ppb today - Evaluate CPAP machine for potential leaks and adjust as necessary  Bladder cancer Bladder cancer being followed by urology. Scheduled for a procedure in June. Current shoulder condition limits ability to undergo certain treatments due to mobility  restrictions.  He is on MACROBID . - Proceed with scheduled bladder procedure in June - Exercise caution with Macrobid  as it can cause pulmonary fibrosis     Advised if symptoms do not improve or worsen, to please contact office for sooner follow up or seek emergency care.    I spent 33 minutes of dedicated to the care of this patient on the date of this encounter to include pre-visit review of records, face-to-face time with the patient discussing conditions above, post visit ordering of testing, clinical documentation with the electronic health record, making appropriate referrals as documented, and communicating necessary findings to members of the patients care team.     C. Chloe Counter, MD Advanced Bronchoscopy PCC[facility]    *This note was generated using voice recognition software/Dragon and/or AI transcription program.  Despite best efforts to proofread, errors can occur which can change the meaning. Any transcriptional errors that result from this process are unintentional and may not be fully corrected at the time of dictation.

## 2023-12-21 ENCOUNTER — Encounter (HOSPITAL_COMMUNITY): Payer: Self-pay | Admitting: Orthopedic Surgery

## 2023-12-21 ENCOUNTER — Encounter (HOSPITAL_COMMUNITY)
Admission: RE | Disposition: A | Discharge status: Home / Self Care | Payer: Self-pay | Source: Home / Self Care | Attending: Orthopedic Surgery

## 2023-12-21 ENCOUNTER — Ambulatory Visit (HOSPITAL_COMMUNITY): Payer: Self-pay | Admitting: Physician Assistant

## 2023-12-21 ENCOUNTER — Ambulatory Visit (HOSPITAL_COMMUNITY)
Admission: RE | Admit: 2023-12-21 | Discharge: 2023-12-21 | Disposition: A | Discharge status: Home / Self Care | Attending: Orthopedic Surgery | Admitting: Orthopedic Surgery

## 2023-12-21 ENCOUNTER — Other Ambulatory Visit: Payer: Self-pay

## 2023-12-21 ENCOUNTER — Ambulatory Visit (HOSPITAL_COMMUNITY): Admitting: Anesthesiology

## 2023-12-21 DIAGNOSIS — I2510 Atherosclerotic heart disease of native coronary artery without angina pectoris: Secondary | ICD-10-CM | POA: Insufficient documentation

## 2023-12-21 DIAGNOSIS — M19011 Primary osteoarthritis, right shoulder: Secondary | ICD-10-CM | POA: Diagnosis not present

## 2023-12-21 DIAGNOSIS — J449 Chronic obstructive pulmonary disease, unspecified: Secondary | ICD-10-CM

## 2023-12-21 DIAGNOSIS — M75101 Unspecified rotator cuff tear or rupture of right shoulder, not specified as traumatic: Secondary | ICD-10-CM | POA: Insufficient documentation

## 2023-12-21 DIAGNOSIS — Z951 Presence of aortocoronary bypass graft: Secondary | ICD-10-CM | POA: Diagnosis not present

## 2023-12-21 DIAGNOSIS — G4733 Obstructive sleep apnea (adult) (pediatric): Secondary | ICD-10-CM | POA: Diagnosis not present

## 2023-12-21 DIAGNOSIS — E119 Type 2 diabetes mellitus without complications: Secondary | ICD-10-CM

## 2023-12-21 DIAGNOSIS — I252 Old myocardial infarction: Secondary | ICD-10-CM | POA: Diagnosis not present

## 2023-12-21 DIAGNOSIS — E1151 Type 2 diabetes mellitus with diabetic peripheral angiopathy without gangrene: Secondary | ICD-10-CM | POA: Insufficient documentation

## 2023-12-21 DIAGNOSIS — Z955 Presence of coronary angioplasty implant and graft: Secondary | ICD-10-CM | POA: Insufficient documentation

## 2023-12-21 DIAGNOSIS — Z87891 Personal history of nicotine dependence: Secondary | ICD-10-CM | POA: Diagnosis not present

## 2023-12-21 DIAGNOSIS — E1122 Type 2 diabetes mellitus with diabetic chronic kidney disease: Secondary | ICD-10-CM | POA: Diagnosis not present

## 2023-12-21 DIAGNOSIS — Z86718 Personal history of other venous thrombosis and embolism: Secondary | ICD-10-CM | POA: Diagnosis not present

## 2023-12-21 DIAGNOSIS — I5032 Chronic diastolic (congestive) heart failure: Secondary | ICD-10-CM | POA: Insufficient documentation

## 2023-12-21 DIAGNOSIS — G8918 Other acute postprocedural pain: Secondary | ICD-10-CM | POA: Diagnosis not present

## 2023-12-21 DIAGNOSIS — J439 Emphysema, unspecified: Secondary | ICD-10-CM | POA: Insufficient documentation

## 2023-12-21 DIAGNOSIS — M12811 Other specific arthropathies, not elsewhere classified, right shoulder: Secondary | ICD-10-CM | POA: Insufficient documentation

## 2023-12-21 DIAGNOSIS — N183 Chronic kidney disease, stage 3 unspecified: Secondary | ICD-10-CM | POA: Diagnosis not present

## 2023-12-21 DIAGNOSIS — I130 Hypertensive heart and chronic kidney disease with heart failure and stage 1 through stage 4 chronic kidney disease, or unspecified chronic kidney disease: Secondary | ICD-10-CM | POA: Diagnosis not present

## 2023-12-21 HISTORY — PX: REVERSE SHOULDER ARTHROPLASTY: SHX5054

## 2023-12-21 SURGERY — ARTHROPLASTY, SHOULDER, TOTAL, REVERSE
Anesthesia: General | Site: Shoulder | Laterality: Right

## 2023-12-21 MED ORDER — SUGAMMADEX SODIUM 200 MG/2ML IV SOLN
INTRAVENOUS | Status: DC | PRN
  Administered 2023-12-21: 200 mg via INTRAVENOUS

## 2023-12-21 MED ORDER — LIDOCAINE HCL (CARDIAC) PF 100 MG/5ML IV SOSY
PREFILLED_SYRINGE | INTRAVENOUS | Status: DC | PRN
  Administered 2023-12-21: 20 mg via INTRAVENOUS

## 2023-12-21 MED ORDER — FENTANYL CITRATE PF 50 MCG/ML IJ SOSY
PREFILLED_SYRINGE | INTRAMUSCULAR | Status: AC
  Filled 2023-12-21: qty 2

## 2023-12-21 MED ORDER — OXYCODONE HCL 5 MG PO TABS
ORAL_TABLET | ORAL | Status: AC
  Filled 2023-12-21: qty 1

## 2023-12-21 MED ORDER — TRANEXAMIC ACID 1000 MG/10ML IV SOLN: 1000.0000 mg | INTRAVENOUS | Status: DC

## 2023-12-21 MED ORDER — VANCOMYCIN HCL 1000 MG IV SOLR
INTRAVENOUS | Status: DC | PRN
  Administered 2023-12-21: 1000 mg

## 2023-12-21 MED ORDER — BUPIVACAINE HCL (PF) 0.5 % IJ SOLN
INTRAMUSCULAR | Status: DC | PRN
Start: 2023-12-21 — End: 2023-12-21
  Administered 2023-12-21: 10 mL

## 2023-12-21 MED ORDER — VANCOMYCIN HCL 1000 MG IV SOLR
INTRAVENOUS | Status: AC
  Filled 2023-12-21: qty 20

## 2023-12-21 MED ORDER — ROCURONIUM BROMIDE 100 MG/10ML IV SOLN
INTRAVENOUS | Status: DC | PRN
  Administered 2023-12-21: 80 mg via INTRAVENOUS

## 2023-12-21 MED ORDER — FENTANYL CITRATE (PF) 100 MCG/2ML IJ SOLN
INTRAMUSCULAR | Status: AC
  Filled 2023-12-21: qty 2

## 2023-12-21 MED ORDER — PROPOFOL 10 MG/ML IV BOLUS
INTRAVENOUS | Status: AC
  Filled 2023-12-21: qty 20

## 2023-12-21 MED ORDER — ONDANSETRON HCL 4 MG/2ML IJ SOLN: 4.0000 mg | Freq: Once | INTRAMUSCULAR | Status: DC | PRN

## 2023-12-21 MED ORDER — PHENYLEPHRINE HCL-NACL 20-0.9 MG/250ML-% IV SOLN
INTRAVENOUS | Status: AC
  Filled 2023-12-21: qty 250

## 2023-12-21 MED ORDER — CHLORHEXIDINE GLUCONATE 0.12 % MT SOLN
15.0000 mL | Freq: Once | OROMUCOSAL | Status: AC
  Administered 2023-12-21: 15 mL via OROMUCOSAL

## 2023-12-21 MED ORDER — LACTATED RINGERS IV SOLN: INTRAVENOUS | Status: DC

## 2023-12-21 MED ORDER — GLYCOPYRROLATE 0.2 MG/ML IJ SOLN
INTRAMUSCULAR | Status: DC | PRN
  Administered 2023-12-21: .2 mg via INTRAVENOUS

## 2023-12-21 MED ORDER — EPHEDRINE SULFATE-NACL 50-0.9 MG/10ML-% IV SOSY
PREFILLED_SYRINGE | INTRAVENOUS | Status: DC | PRN
  Administered 2023-12-21: 10 mg via INTRAVENOUS
  Administered 2023-12-21: 5 mg via INTRAVENOUS
  Administered 2023-12-21: 10 mg via INTRAVENOUS

## 2023-12-21 MED ORDER — ACETAMINOPHEN 10 MG/ML IV SOLN: 1000.0000 mg | Freq: Once | INTRAVENOUS | Status: DC | PRN

## 2023-12-21 MED ORDER — STERILE WATER FOR IRRIGATION IR SOLN
Status: DC | PRN
  Administered 2023-12-21: 1000 mL

## 2023-12-21 MED ORDER — BUPIVACAINE LIPOSOME 1.3 % IJ SUSP
INTRAMUSCULAR | Status: DC | PRN
  Administered 2023-12-21: 10 mL

## 2023-12-21 MED ORDER — ONDANSETRON HCL 4 MG PO TABS: 4.0000 mg | ORAL_TABLET | Freq: Three times a day (TID) | ORAL | 0 refills | Status: DC | PRN

## 2023-12-21 MED ORDER — CYCLOBENZAPRINE HCL 10 MG PO TABS: 5.0000 mg | ORAL_TABLET | Freq: Three times a day (TID) | ORAL | 1 refills | Status: DC | PRN

## 2023-12-21 MED ORDER — PHENYLEPHRINE HCL-NACL 20-0.9 MG/250ML-% IV SOLN
INTRAVENOUS | Status: DC | PRN
  Administered 2023-12-21: 40 ug/min via INTRAVENOUS

## 2023-12-21 MED ORDER — TRANEXAMIC ACID-NACL 1000-0.7 MG/100ML-% IV SOLN
1000.0000 mg | INTRAVENOUS | Status: AC
  Administered 2023-12-21: 1000 mg via INTRAVENOUS
  Filled 2023-12-21: qty 100

## 2023-12-21 MED ORDER — PROPOFOL 10 MG/ML IV BOLUS
INTRAVENOUS | Status: DC | PRN
  Administered 2023-12-21: 120 mg via INTRAVENOUS

## 2023-12-21 MED ORDER — OXYCODONE HCL 5 MG PO TABS
5.0000 mg | ORAL_TABLET | Freq: Once | ORAL | Status: AC | PRN
  Administered 2023-12-21: 5 mg via ORAL

## 2023-12-21 MED ORDER — 0.9 % SODIUM CHLORIDE (POUR BTL) OPTIME
TOPICAL | Status: DC | PRN
  Administered 2023-12-21: 1000 mL

## 2023-12-21 MED ORDER — CEFAZOLIN SODIUM-DEXTROSE 2-4 GM/100ML-% IV SOLN
2.0000 g | INTRAVENOUS | Status: AC
  Administered 2023-12-21: 2 g via INTRAVENOUS
  Filled 2023-12-21: qty 100

## 2023-12-21 MED ORDER — OXYCODONE HCL 5 MG PO TABS
5.0000 mg | ORAL_TABLET | Freq: Once | ORAL | Status: AC
  Administered 2023-12-21: 5 mg via ORAL

## 2023-12-21 MED ORDER — FENTANYL CITRATE PF 50 MCG/ML IJ SOSY
25.0000 ug | PREFILLED_SYRINGE | INTRAMUSCULAR | Status: DC | PRN
  Administered 2023-12-21 (×2): 25 ug via INTRAVENOUS

## 2023-12-21 MED ORDER — PHENYLEPHRINE 80 MCG/ML (10ML) SYRINGE FOR IV PUSH (FOR BLOOD PRESSURE SUPPORT)
PREFILLED_SYRINGE | INTRAVENOUS | Status: DC | PRN
  Administered 2023-12-21 (×2): 80 ug via INTRAVENOUS

## 2023-12-21 MED ORDER — FENTANYL CITRATE (PF) 100 MCG/2ML IJ SOLN
INTRAMUSCULAR | Status: DC | PRN
  Administered 2023-12-21: 25 ug via INTRAVENOUS
  Administered 2023-12-21: 75 ug via INTRAVENOUS

## 2023-12-21 MED ORDER — DEXAMETHASONE SODIUM PHOSPHATE 10 MG/ML IJ SOLN
INTRAMUSCULAR | Status: DC | PRN
  Administered 2023-12-21: 10 mg via INTRAVENOUS

## 2023-12-21 MED ORDER — OXYCODONE HCL 5 MG/5ML PO SOLN: 5.0000 mg | Freq: Once | ORAL | Status: AC | PRN

## 2023-12-21 MED ORDER — ORAL CARE MOUTH RINSE: 15.0000 mL | Freq: Once | OROMUCOSAL | Status: AC

## 2023-12-21 MED ORDER — OXYCODONE-ACETAMINOPHEN 5-325 MG PO TABS: 1.0000 | ORAL_TABLET | ORAL | 0 refills | Status: DC | PRN

## 2023-12-21 MED ORDER — ONDANSETRON HCL 4 MG/2ML IJ SOLN
INTRAMUSCULAR | Status: DC | PRN
  Administered 2023-12-21: 4 mg via INTRAVENOUS

## 2023-12-21 SURGICAL SUPPLY — 59 items
BAG COUNTER SPONGE SURGICOUNT (BAG) IMPLANT
BAG ZIPLOCK 12X15 (MISCELLANEOUS) ×2 IMPLANT
BIT DRILL AR 3 NS (BIT) IMPLANT
BLADE SAW SGTL 83.5X18.5 (BLADE) ×2 IMPLANT
BNDG COHESIVE 4X5 TAN STRL LF (GAUZE/BANDAGES/DRESSINGS) ×2 IMPLANT
CLSR STERI-STRIP ANTIMIC 1/2X4 (GAUZE/BANDAGES/DRESSINGS) IMPLANT
COOLER ICEMAN CLASSIC (MISCELLANEOUS) ×2 IMPLANT
COVER BACK TABLE 60X90IN (DRAPES) ×2 IMPLANT
COVER SURGICAL LIGHT HANDLE (MISCELLANEOUS) ×2 IMPLANT
CUP SUT UNIV REVERS 39 NEU (Shoulder) IMPLANT
DRAPE SHEET LG 3/4 BI-LAMINATE (DRAPES) ×2 IMPLANT
DRAPE SURG 17X11 SM STRL (DRAPES) ×2 IMPLANT
DRAPE SURG ORHT 6 SPLT 77X108 (DRAPES) ×4 IMPLANT
DRAPE TOP 10253 STERILE (DRAPES) ×2 IMPLANT
DRAPE U-SHAPE 47X51 STRL (DRAPES) ×2 IMPLANT
DRESSING AQUACEL AG SP 3.5X6 (GAUZE/BANDAGES/DRESSINGS) ×2 IMPLANT
DURAPREP 26ML APPLICATOR (WOUND CARE) ×2 IMPLANT
ELECT BLADE TIP CTD 4 INCH (ELECTRODE) ×2 IMPLANT
ELECT PENCIL ROCKER SW 15FT (MISCELLANEOUS) ×4 IMPLANT
ELECT REM PT RETURN 15FT ADLT (MISCELLANEOUS) ×2 IMPLANT
FACESHIELD WRAPAROUND (MASK) ×5 IMPLANT
FACESHIELD WRAPAROUND OR TEAM (MASK) ×10 IMPLANT
GLENOID UNI REV MOD 24 +2 LAT (Joint) IMPLANT
GLENOSPHERE 39+4 LAT/24 UNI RV (Joint) IMPLANT
GLOVE BIO SURGEON STRL SZ7.5 (GLOVE) ×2 IMPLANT
GLOVE BIO SURGEON STRL SZ8 (GLOVE) ×2 IMPLANT
GLOVE SS BIOGEL STRL SZ 7 (GLOVE) ×2 IMPLANT
GLOVE SS BIOGEL STRL SZ 7.5 (GLOVE) ×2 IMPLANT
GOWN STRL SURGICAL XL XLNG (GOWN DISPOSABLE) ×4 IMPLANT
INSERT HUMERAL M/39 +3/CNSTRND (Miscellaneous) IMPLANT
KIT BASIN OR (CUSTOM PROCEDURE TRAY) ×2 IMPLANT
KIT TURNOVER KIT A (KITS) ×2 IMPLANT
MANIFOLD NEPTUNE II (INSTRUMENTS) ×2 IMPLANT
NDL TAPERED W/ NITINOL LOOP (MISCELLANEOUS) ×2 IMPLANT
NEEDLE TAPERED W/ NITINOL LOOP (MISCELLANEOUS) ×1 IMPLANT
NS IRRIG 1000ML POUR BTL (IV SOLUTION) ×2 IMPLANT
PACK SHOULDER (CUSTOM PROCEDURE TRAY) ×2 IMPLANT
PAD ARMBOARD POSITIONER FOAM (MISCELLANEOUS) ×2 IMPLANT
PAD COLD SHLDR WRAP-ON (PAD) ×2 IMPLANT
PIN NITINOL TARGETER 2.8 (PIN) IMPLANT
PIN SET MODULAR GLENOID SYSTEM (PIN) IMPLANT
RESTRAINT HEAD UNIVERSAL NS (MISCELLANEOUS) ×2 IMPLANT
SCREW CENTRAL MOD 30MM (Screw) IMPLANT
SCREW PERI LOCK 5.5X36 (Screw) IMPLANT
SCREW PERIPHERAL 5.5X20 LOCK (Screw) IMPLANT
SLING ARM FOAM STRAP LRG (SOFTGOODS) IMPLANT
SLING ARM FOAM STRAP MED (SOFTGOODS) IMPLANT
SPACER SHLD UNI REV 39 +6 (Shoulder) IMPLANT
STEM HUMERAL UNI REV SIZE 12 (Stem) IMPLANT
STRIP CLOSURE SKIN 1/2X4 (GAUZE/BANDAGES/DRESSINGS) ×2 IMPLANT
SUT MNCRL AB 3-0 PS2 18 (SUTURE) ×2 IMPLANT
SUT MON AB 2-0 CT1 36 (SUTURE) ×2 IMPLANT
SUT VIC AB 1 CT1 36 (SUTURE) ×2 IMPLANT
SUTURE TAPE 1.3 40 TPR END (SUTURE) ×4 IMPLANT
TOWEL GREEN STERILE FF (TOWEL DISPOSABLE) ×2 IMPLANT
TOWEL OR 17X26 10 PK STRL BLUE (TOWEL DISPOSABLE) ×2 IMPLANT
TUBE SUCTION HIGH CAP CLEAR NV (SUCTIONS) ×2 IMPLANT
TUBING CONNECTING 10 (TUBING) ×2 IMPLANT
WATER STERILE IRR 1000ML POUR (IV SOLUTION) ×4 IMPLANT

## 2023-12-21 NOTE — H&P (Signed)
 [name]    Chief Complaint: Right Rotator Cuff Tear HPI: The patient is a 83 y.o. male with chronic and progressive increasing right shoulder pain related to severe rotator cuff tear arthropathy.  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 06/2019 Echo (in setting of COVID): EF 35-40%, mild LVH, g1 DD, glob HK; b. 09/2019 Echo: EF 50-55%, Gr1 DD; c. 03/2021 Echo: EF 50-55%, no rwma, mild LVH, GrI DD, nl RV fxn. RVSP 44.69mmHg. Mild-mod dil LA. Triv MR. Mild-mod Ao sclerosis.   Actinic keratosis    Anginal pain (HCC)    Aortic ectasia, abdominal (HCC)    a.) CT abd 01/01/2022: 2.9 cm infrarenal abdominal aortic ectasia   Bladder cancer (HCC) 2023   BPH (benign prostatic hyperplasia) 09/06/2007   CAD s/p CABG    a.) 1990 s/p MI--> CABG x 3; b.) LHC/PCI 2002 --> BMS (unk type) to LCX; c.) LHC 07/07/2015: 50% oLM-LM, 80% oRCA, 80% pRCA-1, 80% pRCA-2, 60% dLAD, 100% o-pLAD, 60% mLAD, 50% D2, LIMA-LAD, SVG-D2, SVG-RI --> med Rx; d.) MV 06/05/2020: fixed apical ant/apical defect. No sign ischemia; e.) MV 09/08/202: EF 52%, no ischemia.   Carotid arterial disease (HCC)    a.) 08/2016 Carotid U/S: <39% bilat; b.) carotid doppler 10/12/2022: mild-to-moderate (<50%) bilateral bifurcation plaque.   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   CKD (chronic kidney disease), stage III (HCC)    Community acquired pneumonia of right lower lobe of lung 06/05/2017   Diverticulosis    Dupuytren's contracture of right hand 10/29/2008   DVT (deep venous thrombosis) (HCC) 1998   Elevated prostate specific antigen (PSA) 07/09/2008   Emphysema of lung (HCC)    GERD 04/30/2007   Heart murmur    History of 2019  novel coronavirus disease (COVID-19) 05/20/2019   History of hiatal hernia    History of shingles    HLD (hyperlipidemia) 04/26/2007   HTN (hypertension) 04/30/2007   Laceration of skin of left hand 03/08/2018   LBBB (left bundle branch block)    Lumbar disc disease with radiculopathy    Migraines    Myocardial infarction (HCC) 1990   a.) resulted in 3v CABG   Myocardial infarction (HCC) 2002   a.) second cardiac event; PCI with placement of BMS (unknown type) to LCx   Neuropathy of both feet    OSA on CPAP    Osteoarthritis    PAD (peripheral artery disease) (HCC)    a. 07/2017 LE duplex: RSFA 75-70m, LSFA 75-15m, 50-74d; c. 08/2018 Periph Angio: No signif AoIliac dzs. Mod-sev Ca2+ RSFA w/ diff dzs throughout- 3 vessel runoff. Borderline signif LSFA dzs w/ mod-sev Ca2+ vessels and 3 vessel runoff below the knee-->Med rx.   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   Right mid dorsum forearm - in situ - EDC   Stroke [facility])    left eye      Past Surgical History:  Procedure Laterality Date   ABDOMINAL AORTOGRAM W/LOWER EXTREMITY [name] 09/12/2018   Procedure: ABDOMINAL AORTOGRAM W/LOWER EXTREMITY;  Surgeon: Wenona Hamilton, MD;  Location: [facility];  Service: Cardiovascular;  Laterality: [name];   ANTERIOR CERVICAL DECOMP/DISCECTOMY FUSION [name] 07/11/2022   Anterior Cervical Decompression Fusion ,Interboy Prothesis,Plate/Screws , Cervical four-five,Cervical five-six Larrie Po, Susana Enter, MD)   APPENDECTOMY     rupture   BACK SURGERY     1984 and then another one [facility]   BLADDER INSTILLATION [name] 10/19/2021   Procedure: BLADDER INSTILLATION OF GEMCITABINE ;  Surgeon: Geraline Knapp, MD;  Location: [facility];  Service:  Urology;  Laterality: [name];   BLADDER INSTILLATION [name] 11/15/2022   Procedure: BLADDER INSTILLATION OF GEMCITABINE ;  Surgeon: Geraline Knapp, MD;  Location: [facility];  Service: Urology;  Laterality: [name];   CARDIAC CATHETERIZATION [name] 07/07/2015   Procedure: Left Heart Cath and Cors/Grafts Angiography;  Surgeon: Devorah Fonder, MD;  Location: [facility];  Service: Cardiovascular;  Laterality: [name];   CATARACT EXTRACTION, BILATERAL  2009   CERVICAL SPINE SURGERY  12/2016   cervical stenosis Larrie Po)   COLONOSCOPY  03/2010   HP polyp, diverticulosis, rpt 10 yrs ([name])   CORONARY ANGIOPLASTY  11/30/2000   LCX   CORONARY ARTERY BYPASS GRAFT  1990   3 vessel    CORONARY STENT INTERVENTION [name] 01/13/2023   Procedure: CORONARY STENT INTERVENTION;  Surgeon: Wenona Hamilton, MD;  Location: [facility];  Service: Cardiovascular;  Laterality: [name];   CYSTOSCOPY  12/23/2010   [facility]   JOINT REPLACEMENT     KNEE ARTHROSCOPY Right    x2   LAMINOTOMY  1986   L5/S1 lumbar laminotomy for two ruptured discs/fusion   LUMBAR LAMINECTOMY/DECOMPRESSION MICRODISCECTOMY [name] 07/13/2016   Procedure: LUMBAR TWO-THREE, LUMBAR THREE-FOUR, LUMBAR FOUR-FIVE LAMINECTOMY AND FORAMINOTOMY;  Surgeon: Garry Kansas, MD;  Location: [facility];  Service: Neurosurgery;  Laterality: [name];  LAMINECTOMY AND FORAMINOTOMY L2-L3, L3-L4,L4-L5   LUMBAR SPINE SURGERY  06/2020   Bilateral redo laminectomy/laminotomy/foraminotomies/medial facetectomy to decompress the bilateral L4 and L5 nerve roots ([facility])   RIGHT/LEFT HEART CATH AND CORONARY ANGIOGRAPHY Bilateral 01/13/2023   Procedure: RIGHT/LEFT HEART CATH AND CORONARY ANGIOGRAPHY;  Surgeon: Wenona Hamilton, MD;  Location: [facility];  Service: Cardiovascular;  Laterality: Bilateral;   TOTAL HIP ARTHROPLASTY Bilateral 1999,2000   TOTAL KNEE ARTHROPLASTY Right 03/06/2017   Procedure: RIGHT TOTAL KNEE ARTHROPLASTY;  Surgeon: Liliane Rei, MD;  Location:  [facility];  Service: Orthopedics;  Laterality: Right;   TRANSURETHRAL RESECTION OF BLADDER TUMOR [name] 10/19/2021   Procedure: TRANSURETHRAL RESECTION OF BLADDER TUMOR (TURBT);  Surgeon: Geraline Knapp, MD;  Location: [facility];  Service: Urology;  Laterality: [name];   TRANSURETHRAL RESECTION OF BLADDER TUMOR [name] 11/15/2022   Procedure: TRANSURETHRAL RESECTION OF BLADDER TUMOR (TURBT);  Surgeon: Geraline Knapp, MD;  Location: [facility];  Service: Urology;  Laterality: [name];    Family History  Problem Relation Age of Onset   Stroke Mother    Heart attack Mother    Diabetes Mother    Stroke Father    Heart disease Father    Polycystic kidney disease Father    Cancer Sister        throat   Polycystic kidney disease Sister    Diabetes Brother        1/2 brother   Cancer Other        5/7 nephews with lung cancer    Social History:  reports that he quit smoking about 35 years ago. His smoking use included cigarettes. He started smoking about 60 years ago. He has a 25 pack-year smoking history. He has been exposed to tobacco smoke. He has never used smokeless tobacco. He reports that he does not drink alcohol and does not use drugs.  BMI: Estimated body mass index is 27.8 kg/m as calculated from the following:   Height as of 12/20/23: 6' (1.829 m).   Weight as of 12/20/23: 93 kg.  Lab Results  Component Value Date   ALBUMIN  4.1 02/28/2023   Diabetes:   Patient has a diagnosis of diabetes,  Lab Results  Component Value Date   HGBA1C 6.0 (H) 12/13/2023   Smoking Status:       Medications Prior to Admission  Medication Sig Dispense Refill   albuterol  (PROVENTIL ) (2.5 MG/3ML) 0.083% nebulizer solution TAKE 3 MLS BY NEBULIZATION EVERY 6 HOURSAS NEEDED FOR WHEEZING OR SHORTNESS OF BREATH 75 mL 12   albuterol  (VENTOLIN  HFA) 108 (90 Base) MCG/ACT inhaler TAKE 2 PUFFS INTO LUNGS EVERY 6 HOURS ASNEEDED FOR WHEEZING OR SHORTNESS OF BREATH 8.5 each 3   amoxicillin  (AMOXIL ) 500 MG capsule TAKE 4  CAPSULES 30-60 MINUTES PRIOR TO PROCEDURE 4 capsule 1   aspirin  EC 81 MG EC tablet Take 1 tablet (81 mg total) by mouth daily. 30 tablet 0   atorvastatin  (LIPITOR) 20 MG tablet TAKE 1 TABLET BY MOUTH DAILY 90 tablet 3   carvedilol  (COREG ) 3.125 MG tablet TAKE 1 TABLET BY MOUTH TWICE DAILY 180 tablet 2   Cholecalciferol (VITAMIN D3) 25 MCG (1000 UT) CAPS Take 1 capsule (1,000 Units total) by mouth 2 (two) times a week.     Cyanocobalamin  (VITAMIN B-12 ER PO) Take 1 capsule by mouth daily.     cyclobenzaprine  (FLEXERIL ) 5 MG tablet Take 1 tablet (5 mg total) by mouth 3 (three) times daily as needed for muscle spasms. 30 tablet 0   docusate sodium  (COLACE) 100 MG capsule Take 1 capsule (100 mg total) by mouth daily as needed for mild constipation.     Dupilumab (DUPIXENT) 300 MG/2ML SOAJ Inject 300 mg into the skin every 14 (fourteen) days. **patient completed loading dose in clinic** 12 mL 1   ezetimibe  (ZETIA ) 10 MG tablet TAKE 1 TABLET BY MOUTH DAILY 90 tablet 0   famotidine  (PEPCID ) 20 MG tablet Take 20 mg by mouth daily.     fluticasone  (FLONASE ) 50 MCG/ACT nasal spray Place 1 spray into both nostrils daily as needed for allergies or rhinitis.     Fluticasone -Umeclidin-Vilant (TRELEGY ELLIPTA ) 200-62.5-25 MCG/ACT AEPB Inhale 1 puff into the lungs daily. 180 each 3   furosemide  (LASIX ) 40 MG tablet Take 0.5 tablets (20 mg total) by mouth daily as needed. Take 1 tablet (40 mg) daily as need for weight greater then 208. 180 tablet 3   gabapentin  (NEURONTIN ) 600 MG tablet Take 1 tablet (600 mg total) by mouth 3 (three) times daily. 270 tablet 4   HYDROcodone -acetaminophen  (NORCO/VICODIN) 5-325 MG tablet Take 1 tablet by mouth 3 (three) times daily as needed for moderate pain (pain score 4-6). 15 tablet 0   hydrocortisone  2.5 % cream Apply topically 2 (two) times daily as needed (Rash). 30 g 5   losartan  (COZAAR ) 25 MG tablet Take 1 tablet (25 mg total) by mouth daily. 90 tablet 3   montelukast   (SINGULAIR ) 10 MG tablet TAKE 1 TABLET BY MOUTH DAILY 30 tablet 11   mupirocin  ointment (BACTROBAN ) 2 %  Apply 1 Application topically 2 (two) times daily. 22 g 0   nitroGLYCERIN  (NITROSTAT ) 0.4 MG SL tablet Place 1 tablet (0.4 mg total) under the tongue every 5 (five) minutes as needed for chest pain. 25 tablet 3   polyethylene glycol (MIRALAX  / GLYCOLAX ) 17 g packet Take 17 g by mouth daily as needed for mild constipation.      ranolazine  (RANEXA ) 500 MG 12 hr tablet TAKE ONE (1) TABLET BY MOUTH TWO TIMES PER DAY 180 tablet 3   spironolactone  (ALDACTONE ) 25 MG tablet Take 0.5 tablets (12.5 mg total) by mouth daily. 45 tablet 3   tamsulosin  (FLOMAX ) 0.4 MG CAPS capsule TAKE 1 CAPSULE BY MOUTH AT BEDTIME 90 capsule 1   isosorbide  mononitrate (IMDUR ) 30 MG 24 hr tablet TAKE 1 TABLET BY MOUTH DAILY. 90 tablet 3   nitrofurantoin , macrocrystal-monohydrate, (MACROBID ) 100 MG capsule Take 1 capsule (100 mg total) by mouth 2 (two) times daily. 14 capsule 0     Physical Exam: Right shoulder demonstrates painful and guarded motion as noted at his recent office visits.  He has globally decreased strength to manual muscle testing.  Neurovascular intact in the right upper extremity.  Examination otherwise as documented at his recent office visit.  Imaging studies confirm changes consistent with a chronic right shoulder rotator cuff tear arthropathy.  Vitals  Temp:  [97.6 F (36.4 C)-98.2 F (36.8 C)] 98.2 F (36.8 C) (05/08 0605) Pulse Rate:  [63-66] 63 (05/08 0605) Resp:  [18] 18 (05/08 0605) BP: (132-134)/(58-62) 132/58 (05/08 0605) SpO2:  [95 %-98 %] 95 % (05/08 0605) Weight:  [93 kg] 93 kg (05/07 0941)  Assessment/Plan  Impression: Right Rotator Cuff Tear  Plan of Action: Procedure(s): ARTHROPLASTY, SHOULDER, TOTAL, REVERSE  Adal Sereno M Toriana Sponsel 12/21/2023, 6:27 AM Contact # [phone]

## 2023-12-21 NOTE — Evaluation (Signed)
 Occupational Therapy Evaluation Patient Details Name: [name] MRN: [id number] DOB: 1941-02-06 Today's Date: 12/21/2023   History of Present Illness   83 yo M s/p TSA.  PMH includes: ACDF, CABG, CHF, HTN, OSA.     Clinical Impressions Patient admitted for the diagnosis above.  PTA he lives with his spouse, who can assist as needed.  MD precautions: Shoulder FF 0-60; ER 0-20; Abd 0-45. ROM for ADL purposes only; AROM elbow/wrist/hand; OK for lap slides and pendulums; Loosen sling from neck when arm is supported in sitting.  Patient able to get dressed with Mod A, moving with supervision and preparing to discharge home.  Recommend follow up as prescribed by MD.        If plan is discharge home, recommend the following:   A lot of help with bathing/dressing/bathroom     Functional Status Assessment   Patient has had a recent decline in their functional status and demonstrates the ability to make significant improvements in function in a reasonable and predictable amount of time.     Equipment Recommendations   None recommended by OT     Recommendations for Other Services         Precautions/Restrictions   Precautions Precautions: Shoulder Type of Shoulder Precautions: per MD Shoulder Interventions: Shoulder sling/immobilizer;Off for dressing/bathing/exercises Precaution Booklet Issued: Yes (comment) Recall of Precautions/Restrictions: Intact Restrictions Weight Bearing Restrictions Per Provider Order: Yes RUE Weight Bearing Per Provider Order: Non weight bearing     Mobility Bed Mobility               General bed mobility comments: up in recliner    Transfers Overall transfer level: Needs assistance   Transfers: Sit to/from Stand Sit to Stand: Supervision                  Balance Overall balance assessment: Mild deficits observed, not formally tested                                         ADL either performed  or assessed with clinical judgement   ADL                   Upper Body Dressing : Moderate assistance   Lower Body Dressing: Moderate assistance   Toilet Transfer: Supervision/safety                   Vision Patient Visual Report: No change from baseline       Perception Perception: Not tested       Praxis Praxis: Not tested       Pertinent Vitals/Pain Pain Assessment Pain Assessment: Faces Faces Pain Scale: Hurts even more Pain Location: R shoulder Pain Descriptors / Indicators: Sore, Tender, Grimacing Pain Intervention(s): Monitored during session, Premedicated before session     Extremity/Trunk Assessment Upper Extremity Assessment Upper Extremity Assessment: Right hand dominant;RUE deficits/detail RUE Deficits / Details: s/p TSA with block in place RUE Sensation: decreased light touch;decreased proprioception RUE Coordination: decreased fine motor;decreased gross motor   Lower Extremity Assessment Lower Extremity Assessment: Overall WFL for tasks assessed   Cervical / Trunk Assessment Cervical / Trunk Assessment: Kyphotic   Communication Communication Communication: No apparent difficulties   Cognition Arousal: Alert Behavior During Therapy: WFL for tasks assessed/performed Cognition: No apparent impairments  Following commands: Intact       Cueing  General Comments       VSS   Exercises     Shoulder Instructions      Home Living Family/patient expects to be discharged to:: Private residence Living Arrangements: Spouse/significant other Available Help at Discharge: Family;Available 24 hours/day Type of Home: House Home Access: Stairs to enter [employer] of Steps: 3 Entrance Stairs-Rails: Right;Left;Can reach both Home Layout: One level     Bathroom Shower/Tub: Producer, television/film/video: Handicapped height Bathroom Accessibility: Yes How Accessible:  Accessible via walker Home Equipment: Rolling Walker (2 wheels);Rollator (4 wheels);Adaptive equipment;Hand held shower head Adaptive Equipment: Reacher;Sock aid        Prior Functioning/Environment Prior Level of Function : Independent/Modified Independent;Driving                    OT Problem List: Pain;Impaired sensation   OT Treatment/Interventions:        OT Goals(Current goals can be found in the care plan section)   Acute Rehab OT Goals Patient Stated Goal: Discharging home OT Goal Formulation: With patient Time For Goal Achievement: 12/25/23 Potential to Achieve Goals: Good   OT Frequency:       Co-evaluation              AM-PAC OT "6 Clicks" Daily Activity     Outcome Measure Help from another person eating meals?: A Little Help from another person taking care of personal grooming?: A Little Help from another person toileting, which includes using toliet, bedpan, or urinal?: A Little Help from another person bathing (including washing, rinsing, drying)?: A Lot Help from another person to put on and taking off regular upper body clothing?: A Lot Help from another person to put on and taking off regular lower body clothing?: A Lot 6 Click Score: 15   End of Session Nurse Communication: Mobility status  Activity Tolerance: Patient tolerated treatment well Patient left: in chair;with call bell/phone within reach  OT Visit Diagnosis: Pain Pain - Right/Left: Right Pain - part of body: Shoulder                Time: 1110-1138 OT Time Calculation (min): 28 min Charges:  OT General Charges $OT Visit: 1 Visit OT Evaluation $OT Eval Moderate Complexity: 1 Mod OT Treatments $Self Care/Home Management : 8-22 mins  12/21/2023  RP, OTR/L  Acute Rehabilitation Services  Office:  [phone]   [name] 12/21/2023, 11:45 AM

## 2023-12-21 NOTE — Anesthesia Postprocedure Evaluation (Signed)
 Anesthesia Post Note  Patient: DAMIANE MARANAN  Procedure(s) Performed: ARTHROPLASTY, SHOULDER, TOTAL, REVERSE (Right: Shoulder)     Patient location during evaluation: PACU Anesthesia Type: General Level of consciousness: awake and alert Pain management: pain level controlled Vital Signs Assessment: post-procedure vital signs reviewed and stable Respiratory status: spontaneous breathing, nonlabored ventilation, respiratory function stable and patient connected to nasal cannula oxygen  Cardiovascular status: blood pressure returned to baseline and stable Postop Assessment: no apparent nausea or vomiting Anesthetic complications: yes   Encounter Notable Events  Notable Event Outcome Phase Comment  Difficult to intubate - expected  Intraprocedure Filed from anesthesia note documentation.    Last Vitals:  Vitals:   12/21/23 1030 12/21/23 1059  BP: 129/60 128/60  Pulse: 68 67  Resp: 19 16  Temp:    SpO2: 98% 94%    Last Pain:  Vitals:   12/21/23 1225  TempSrc:   PainSc: 6                  Leslye Rast

## 2023-12-21 NOTE — Care Plan (Signed)
 Ortho Bundle Case Management Note  Patient Details  Name: [name] MRN: [id number] Date of Birth: November 22, 1940  R Rev TSA on 12/21/23.   DCP: Home with wife.  DME: No needs.  PT: [facility]. Referral faxed                  DME Arranged:  [name] DME Agency:  NA  HH Arranged:    HH Agency:     Additional Comments: Please contact me with any questions of if this plan should need to change.  Bronwen Canon, [place] [facility] [phone]   12/21/2023, 7:58 AM

## 2023-12-21 NOTE — Transfer of Care (Signed)
 Immediate Anesthesia Transfer of Care Note  Patient: [name]  Procedure(s) Performed: ARTHROPLASTY, SHOULDER, TOTAL, REVERSE (Right: Shoulder)  Patient Location: PACU  Anesthesia Type:General  Level of Consciousness: awake  Airway & Oxygen  Therapy: Patient Spontanous Breathing and Patient connected to face mask oxygen   Post-op Assessment: Report given to RN and Post -op Vital signs reviewed and stable  Post vital signs: Reviewed and stable  Last Vitals:  Vitals Value Taken Time  BP 118/48 12/21/23 0945  Temp    Pulse 72 12/21/23 0949  Resp 15 12/21/23 0949  SpO2 92 % 12/21/23 0949  Vitals shown include unfiled device data.  Last Pain:  Vitals:   12/21/23 0630  TempSrc:   PainSc: 0-No pain         Complications:  Encounter Notable Events  Notable Event Outcome Phase Comment  Difficult to intubate - expected  Intraprocedure Filed from anesthesia note documentation.

## 2023-12-21 NOTE — Anesthesia Procedure Notes (Signed)
 Anesthesia Regional Block: Interscalene brachial plexus block   Pre-Anesthetic Checklist: , timeout performed,  Correct Patient, Correct Site, Correct Laterality,  Correct Procedure, Correct Position, site marked,  Risks and benefits discussed,  Surgical consent,  Pre-op evaluation,  At surgeon's request and post-op pain management  Laterality: Right  Prep: Maximum Sterile Barrier Precautions used, chloraprep       Needles:  Injection technique: Single-shot  Needle Type: Echogenic Needle     Needle Length: 5cm  Needle Gauge: 22     Additional Needles:   Procedures:,,,, ultrasound used (permanent image in chart),,    Narrative:  Start time: 12/21/2023 7:00 AM End time: 12/21/2023 7:05 AM Injection made incrementally with aspirations every 5 mL.  Performed by: Personally  Anesthesiologist: Leslye Rast, MD

## 2023-12-21 NOTE — Anesthesia Procedure Notes (Signed)
 Procedure Name: Intubation Date/Time: 12/21/2023 7:46 AM  Performed by: Mertie Abt, CRNAPre-anesthesia Checklist: Patient identified, Patient being monitored, Timeout performed, Emergency Drugs available and Suction available Patient Re-evaluated:Patient Re-evaluated prior to induction Oxygen  Delivery Method: Circle system utilized Preoxygenation: Pre-oxygenation with 100% oxygen  Induction Type: IV induction Ventilation: Mask ventilation without difficulty Laryngoscope Size: Glidescope and 4 Grade View: Grade I Tube type: Oral Tube size: 7.5 mm Number of attempts: 1 Airway Equipment and Method: Stylet and Video-laryngoscopy Placement Confirmation: ETT inserted through vocal cords under direct vision, positive ETCO2 and breath sounds checked- equal and bilateral Secured at: 23 cm Tube secured with: Tape Dental Injury: Teeth and Oropharynx as per pre-operative assessment  Difficulty Due To: Difficulty was anticipated, Difficult Airway- due to anterior larynx and Difficult Airway- due to reduced neck mobility

## 2023-12-21 NOTE — Op Note (Signed)
 12/21/2023  9:18 AM  PATIENT:   [name]  83 y.o. 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  ASSISTANTS: Rand Burrs, PA-C  Rand Burrs, PA-C was utilized as an Geophysicist/field seismologist throughout this case, essential for help with positioning the patient, positioning extremity, tissue manipulation, implantation of the prosthesis, suture management, wound closure, and intraoperative decision-making.  ANESTHESIA:   General Endotracheal and interscalene block with Exparel   EBL: 150 cc  SPECIMEN: None  Drains: None   PATIENT DISPOSITION:  PACU - hemodynamically stable.    PLAN OF CARE: Discharge to home after PACU  Brief history:  Patient is an 83 year old gentleman who has been followed for chronic and progressively increasing right shoulder pain related to extensive degenerative chondrosis as well as diffuse rotator cuff tendinopathy and degenerative change with increasing pain that has been refractory to prolonged attempts at conservative management.  He is brought to the operating this time for planned right shoulder reverse arthroplasty.  Preoperatively, I counseled the patient regarding treatment options and risks versus benefits thereof.  Possible surgical complications were all reviewed including potential for bleeding, infection, neurovascular injury, persistent pain, loss of motion, anesthetic complication, failure of the implant, and possible need for additional surgery. They understand and accept and agrees with our planned procedure.   Procedure detail:  After undergoing routine preop evaluation the patient received prophylactic antibiotics  and interscalene block with Exparel  was established in the holding area by the anesthesia department.  Subsequently placed spine on the operating table and underwent the smooth induction of a general endotracheal anesthesia.  Placed into the beachchair position and appropriately padded and protected.  The right shoulder girdle region was sterilely prepped and draped in standard fashion.  Timeout was called.  A deltopectoral approach was made to the right shoulder through an approximate centimeter incision.  Skin flaps elevated dissection carried deeply and deltopectoral interval was then developed from proximal to distal with the vein taken laterally.  Adhesions were divided beneath the deltoid and the conjoined tendon was mobilized and retracted medially.  Of note there was significant synovitis and hyperemia with multiple bleeding points due to the severe inflammation in and around the shoulder and meticulous hemostasis was maintained throughout.  The long head biceps tendon was then tenodesed at the upper border the pectoralis major tendon with proximal segment unroofed and excised.  The superior rotator cuff was split from the apex of the bicipital groove to the base of the coracoid and the subscap was separated from the left tuberosity using electrocautery the margin was then tagged with a pair of grasping suture tape sutures.  Capsular attachments were then divided from the anterior and inferior margins of the humeral neck allowing deliver the humeral head through the wound.  An extra medullary guide was then used to outline the proposed humeral head resection which we performed with an oscillating saw at approximate 20 degrees retroversion.  Marginal osteophytes were removed with rongeurs.  We then placed a metal cap over the cut proximal humeral surface and then exposed the glenoid and found difficulties and exposure so we went back and resected an additional 2 mm from the humeral head and once again placed  a metal cap to protect the humeral  metaphysis and at this point a good exposure of the glenoid.  A circumferential labral resection was performed and of note there was severe synovitis and extensive synovectomy and capsulectomy was performed.  A guidepin was then inserted into the center of the glenoid and the glenoid was prepared with the central followed by a peripheral reamer to a stable subchondral bony bed.  Preparation completed with a drill and tapped for a 30 mm lag screw.  Our baseplate was then assembled and inserted with vancomycin powder applied to the threads of the lag screw and excellent fixation was achieved.  The peripheral locking screws were all then placed using standard technique with excellent fixation.  A 39/+4 glenosphere was then impacted onto the baseplate and a central locking screw was placed.  Our attention was then returned back to the proximal humerus where the canal was opened and we broached up to a size 12 stem at approximate 20 degrees of retroversion.  A neutral metaphyseal reaming guide was then used to prepare the metaphysis.  A trial implant was placed and trial reduction showed good motion stability and soft tissue balance.  Our trial was then removed.  A final implant was assembled.  The canal was irrigated cleaned and dried with vancomycin powder applied into the canal.  The implant was then seated with excellent fixation.  A series of trial reductions was then performed and we ultimately felt that a total of +9 off the implant gave is the best motion stability and soft tissue balance.  At this point a +6 spacer was then affixed to the stem and a +3 constrained poly applied to the implant and the final reduction was performed to an extra motion stability and soft tissue balance all much to our satisfaction.  The wound was then copiously irrigated.  Final hemostasis was obtained.  The subscapularis was mobilized and repaired back to Thylox on the collar the implant using the  previously placed suture tape sutures.  The deltopectoral interval was reapproximated with a series of figure-of-eight number Vicryl sutures.  2-0 Monocryl used to close the subcu layer and intracuticular 3-0 Monocryl used to close the skin followed by Steri-Strips and Aquacel dressing.  The right arm placed placed into a sling.  The patient was awakened, extubated, and taken to the recovery room in stable condition.  Glo Larch MD   Contact # [phone]

## 2023-12-21 NOTE — Discharge Instructions (Signed)
 Vania Rea. Supple, M.D., F.A.A.O.S. [facility] Specializing in Arthroscopic and Reconstructive Surgery of the Shoulder [phone] [address] [place], [place] [address] - Fax [phone]    POST-OP REVERSE TOTAL SHOULDER REPLACEMENT INSTRUCTIONS  1. Your first follow up appointment is:  2. The bandage over your incision is waterproof. You may begin showering with this dressing on immediately but do not submerge in a bath tub or hot tub. You may leave this dressing on until first follow up appointment in around 2 weeks. We prefer you leave this dressing in place until follow up however after 5-7 days if you are having itching or skin irritation and would like to remove it you may do so. Go slow and tug at the borders gently to break the bond the dressing has with the skin. At this point if there is no drainage it is okay to go without a bandage or you may cover it with a light gauze and tape. Leave your steri-strips across your incision. You can also expect significant bruising around your shoulder that will drift down your arm and into your chest wall. This is very normal and should resolve over several days. It is also very normal to have some swelling to the operative extremity that will involve the forearm and hand and fingers. It is recommended that if you notice this then try to elevate your hand and fist above your heart and perform gripping exercises several times an hour to help reduce this swelling (see #4).   3. Wear your sling if you are going to be up walking around and when you go to sleep at night. Also protect the arm in the sling until your nerve block has worn off. Then it is ok to remove the sling to perform the exercises below or to occasionally let your arm dangle by your side to stretch your elbow. It is ok to remove your sling if you are sitting in a controlled environment and allow your arm to rest in a position of comfort by your side or on your  lap with pillows to give your neck and skin a break from the sling. You may remove it to allow arm to dangle by side to shower. It is also ok to use your operative extremity to help do light waist level activities and to assist with hygiene and ADL's such as brushing your teeth, getting dressed and toileting. We do ask you refrain from reaching behind your body or back. Also do not use the operative arm to support body weight when getting up or down from bed or a chair.  4. Range of motion to your elbow, wrist, and hand are encouraged 3-5 times daily. Exercise to your hand and fingers helps to reduce swelling you may experience. If you have a foam ball you can use this. If not you can simply use a washcloth to squeeze.   5. Utilize the ice machine as much as possible over the first several days, but it is OK to remove to get up to walk around several times a day to stretch your legs and to go to the bathroom. When in the ice machine, please make sure there is a layer of clothing between the sleeve and your skin and check your skin frequently to make sure there is no redness or skin irritation, if you notice this then give your skin a break from the ice machine for several hours. After the first 3-4 days you  can gradually reduce how much time you spend in the ice machine and adjust to your level of pain. In general it is a good rule of thumb that if you are experiencing pain you should be using the ice machine.  6. Prescriptions for a pain medication and a muscle relaxant are provided for you. It is recommended that if you are experiencing pain that your pain medication alone is not controlling, add the muscle relaxant along with the pain medication which can give additional pain relief. The first 1-2 days after your block has worn off is generally the most severe of your pain and then should gradually decrease. As your pain lessens it is recommended that you decrease your use of the pain medications to an "as  needed basis'" only and to always comply with the recommended dosages of the pain medications. It is also ok to utilize over the counter form of pain medicines such as acetaminophen instead of the prescription pain medications if your pain is only mild. But do not use the prescription pain med and acetaminophen at the same time, it is either one or the other, not both.  7. Pain medications can produce constipation along with their use. If you experience this, the use of an over the counter stool softener or laxative daily is recommended.   8. For additional questions or concerns, please do not hesitate to call the office. If after hours there is an answering service to forward your concerns to the physician or physician assistant on call.  9. Pain control following an exparel nerve block:  To help control your post-operative pain you received a nerve block  performed with Exparel which is a long acting anesthetic (numbing agent) which can provide pain relief and sensations of numbness (and relief of pain) in the operative shoulder and arm for up to 3 days. Sometimes it provides mixed relief, meaning you may still have numbness in certain areas of the arm but can still be able to move parts of that arm, hand, and fingers. We recommend that your prescribed pain medications be used as needed. We do not feel it is necessary to "pre medicate" and "stay ahead" of pain.  Taking narcotic pain medications when you are not having any pain can lead to unnecessary and potentially dangerous side effects.  While the effects of the nerve block are present, please be aware that while you do not have sensation we recommend you pay careful attention to keep your arm positioned in a protective way. Also if you have a sling that has a "thumb loop" that helps to keep the sling from sliding backwards, make sure you remove the loop to avoid a constant pressure to the thumb which can cause some nerve damage or skin  breakdown.  10. Most people find it more comfortable to sleep in a semi upright position or in a recliner with the arm supported. Again, we do recommend you wear your sling to sleep. It is also ok to try to sleep lying flat in bed with a pillow behind your arm to keep it from sliding or falling backwards. If you are trying to sleep on the non-operative side, please use pillows to position your arm so that it does not slide forwards or backwards. It is very common that sleeping and getting into a comfortable position is difficult after shoulder surgery, this should improve with time.  11. Dressing - It is recommended you wear shirts that are loose or button up the  front. To put shirt on allow operative arm to dangle by your side and slide the shirt on that arm, then your head and then the non-operative arm. To remove the shirt do this sequence in reverse. We recommend you wear the sling over top of your shirt to prevent skin irritation. If you notice irritation in your axilla (arm pit) please try to elevate arm away from your body to allow air to get in this area and consider use of an over the counter ointment such as hydrocortisone or simple lotion.  FOR ADDITIONAL INFO ON THE [employer] ICE MACHINE AND INSTRUCTIONS GO TO THE WEBSITE AT  [email or web address]   I  12.  We recommend that you avoid any dental work or cleaning in the first 3 months following your joint replacement. This is to help minimize the possibility of infection from the bacteria in your mouth that enters your bloodstream during dental work. We also recommend that you take an antibiotic prior to your dental work for the first year after your shoulder replacement to further help reduce that risk. Please simply contact our office for antibiotics to be sent to your pharmacy prior to dental work.  13. Post Op Exercises: Please see attached sheet with diagram

## 2023-12-22 ENCOUNTER — Encounter (HOSPITAL_COMMUNITY): Payer: Self-pay | Admitting: Orthopedic Surgery

## 2023-12-25 ENCOUNTER — Encounter: Payer: Self-pay | Admitting: Pulmonary Disease

## 2023-12-28 ENCOUNTER — Other Ambulatory Visit: Payer: HMO | Admitting: Urology

## 2023-12-28 ENCOUNTER — Encounter: Payer: Self-pay | Admitting: Family Medicine

## 2023-12-28 ENCOUNTER — Ambulatory Visit: Admitting: Family Medicine

## 2023-12-28 ENCOUNTER — Ambulatory Visit: Payer: Self-pay

## 2023-12-28 VITALS — BP 116/58 | HR 73 | Temp 98.3°F | Ht 72.0 in | Wt 213.0 lb

## 2023-12-28 DIAGNOSIS — I5020 Unspecified systolic (congestive) heart failure: Secondary | ICD-10-CM

## 2023-12-28 DIAGNOSIS — R32 Unspecified urinary incontinence: Secondary | ICD-10-CM | POA: Diagnosis not present

## 2023-12-28 DIAGNOSIS — R400 Somnolence: Secondary | ICD-10-CM | POA: Diagnosis not present

## 2023-12-28 DIAGNOSIS — R600 Localized edema: Secondary | ICD-10-CM

## 2023-12-28 LAB — POC URINALSYSI DIPSTICK (AUTOMATED)
Bilirubin, UA: NEGATIVE
Blood, UA: NEGATIVE
Glucose, UA: NEGATIVE
Ketones, UA: NEGATIVE
Leukocytes, UA: NEGATIVE
Nitrite, UA: NEGATIVE
Protein, UA: NEGATIVE
Spec Grav, UA: 1.015 (ref 1.010–1.025)
Urobilinogen, UA: 0.2 U/dL
pH, UA: 5.5 (ref 5.0–8.0)

## 2023-12-28 NOTE — Telephone Encounter (Signed)
 Pt has appt with me in 2 hours. Wil eval then.

## 2023-12-28 NOTE — Progress Notes (Signed)
 Patient ID: [name], male    DOB: August 08, 1941, 83 y.o.   MRN: [id number]  This visit was conducted in person.  BP (!) 116/58 (BP Location: Left Arm, Patient Position: Sitting, Cuff Size: Normal)   Pulse 73   Temp 98.3 F (36.8 C) (Temporal)   Ht 6' (1.829 m)   Wt 213 lb (96.6 kg)   SpO2 96%   BMI 28.89 kg/m    CC:  Chief Complaint  Patient presents with   Edema    And pain in both legs since last Thursday. Patient had a reaction to the oxycodone  which has made him extremely sleepy. He has been off this for 3 days now and still very very tried.     Subjective:   HPI: [name] is a 83 y.o. male with history of HTN, coronary artery disease, peripheral artery disease, type 2 diabetes , history of cardiomyopathy , HFrEF, right COPD, chronic kidney disease presenting on 12/28/2023 for Edema (And pain in both legs since last Thursday. Patient had a reaction to the oxycodone  which has made him extremely sleepy. He has been off this for 3 days now and still very very tried. )  Status post CABG in 1990 Cardiac catheterization 2024 Echo January 2025 ejection fraction 40 to 45%  1 week of new onset swelling in both legs  Recent URI total shoulder arthroplasty on 5/8   He thinks he had  side effects to oxycodone  that he took for the first time 3 days ago... felt sedated... better now off but still very tired. Call Dr. Leandro Proffer office... changed to hydrocdone but has not taken yet. No pain meds since in 2 days. No current pain in shoulder.   Given more tired he has not been moving as much.  Tight in feet, no pain in calves. No CP, no heart racing, stable SOb.  Low grade temp 4 days ago.. 100F... none since.  Has had some recent incontinence. No dysuria. Possible catherization.  On spironolactone  25 mg daily, losartan  25 mg daily, Lasix  20 mg daily prn ( has not been taking any extra, but has not been weighing Coreg  3.125 mg daily. (Had tried Entresto  without  tolerating)  Reviewed recent cardiology office visit note, Dr. Lamount Pimple from Dec 14, 2023    Has follow up with surgeon next week.  Wt Readings from Last 3 Encounters:  12/28/23 213 lb (96.6 kg)  12/21/23 205 lb (93 kg)  12/20/23 205 lb (93 kg)     Relevant past medical, surgical, family and social history reviewed and updated as indicated. Interim medical history since our last visit reviewed. Allergies and medications reviewed and updated. Outpatient Medications Prior to Visit  Medication Sig Dispense Refill   albuterol  (PROVENTIL ) (2.5 MG/3ML) 0.083% nebulizer solution TAKE 3 MLS BY NEBULIZATION EVERY 6 HOURSAS NEEDED FOR WHEEZING OR SHORTNESS OF BREATH 75 mL 12   albuterol  (VENTOLIN  HFA) 108 (90 Base) MCG/ACT inhaler TAKE 2 PUFFS INTO LUNGS EVERY 6 HOURS ASNEEDED FOR WHEEZING OR SHORTNESS OF BREATH 8.5 each 3   amoxicillin  (AMOXIL ) 500 MG capsule TAKE 4 CAPSULES 30-60 MINUTES PRIOR TO PROCEDURE 4 capsule 1   aspirin  EC 81 MG EC tablet Take 1 tablet (81 mg total) by mouth daily. 30 tablet 0   atorvastatin  (LIPITOR) 20 MG tablet TAKE 1 TABLET BY MOUTH DAILY 90 tablet 3   carvedilol  (COREG ) 3.125 MG tablet TAKE 1 TABLET BY MOUTH TWICE DAILY 180 tablet 2   Cholecalciferol (VITAMIN D3)  25 MCG (1000 UT) CAPS Take 1 capsule (1,000 Units total) by mouth 2 (two) times a week.     Cyanocobalamin  (VITAMIN B-12 ER PO) Take 1 capsule by mouth daily.     cyclobenzaprine  (FLEXERIL ) 10 MG tablet Take 0.5 tablets (5 mg total) by mouth 3 (three) times daily as needed for muscle spasms. 30 tablet 1   docusate sodium  (COLACE) 100 MG capsule Take 1 capsule (100 mg total) by mouth daily as needed for mild constipation.     Dupilumab (DUPIXENT) 300 MG/2ML SOAJ Inject 300 mg into the skin every 14 (fourteen) days. **patient completed loading dose in clinic** 12 mL 1   ezetimibe  (ZETIA ) 10 MG tablet TAKE 1 TABLET BY MOUTH DAILY 90 tablet 0   famotidine  (PEPCID ) 20 MG tablet Take 20 mg by mouth daily.      fluticasone  (FLONASE ) 50 MCG/ACT nasal spray Place 1 spray into both nostrils daily as needed for allergies or rhinitis.     Fluticasone -Umeclidin-Vilant (TRELEGY ELLIPTA ) 200-62.5-25 MCG/ACT AEPB Inhale 1 puff into the lungs daily. 180 each 3   furosemide  (LASIX ) 40 MG tablet Take 0.5 tablets (20 mg total) by mouth daily as needed. Take 1 tablet (40 mg) daily as need for weight greater then 208. 180 tablet 3   gabapentin  (NEURONTIN ) 600 MG tablet Take 1 tablet (600 mg total) by mouth 3 (three) times daily. 270 tablet 4   hydrocortisone  2.5 % cream Apply topically 2 (two) times daily as needed (Rash). 30 g 5   isosorbide  mononitrate (IMDUR ) 30 MG 24 hr tablet TAKE 1 TABLET BY MOUTH DAILY. 90 tablet 3   losartan  (COZAAR ) 25 MG tablet Take 1 tablet (25 mg total) by mouth daily. 90 tablet 3   montelukast  (SINGULAIR ) 10 MG tablet TAKE 1 TABLET BY MOUTH DAILY 30 tablet 11   mupirocin  ointment (BACTROBAN ) 2 % Apply 1 Application topically 2 (two) times daily. (Patient taking differently: Apply 1 Application topically daily as needed (irritation).) 22 g 0   nitrofurantoin , macrocrystal-monohydrate, (MACROBID ) 100 MG capsule Take 1 capsule (100 mg total) by mouth 2 (two) times daily. (Patient not taking: Reported on 01/02/2024) 14 capsule 0   nitroGLYCERIN  (NITROSTAT ) 0.4 MG SL tablet Place 1 tablet (0.4 mg total) under the tongue every 5 (five) minutes as needed for chest pain. 25 tablet 3   ondansetron  (ZOFRAN ) 4 MG tablet Take 1 tablet (4 mg total) by mouth every 8 (eight) hours as needed for nausea or vomiting. 10 tablet 0   oxyCODONE -acetaminophen  (PERCOCET) 5-325 MG tablet Take 1 tablet by mouth every 4 (four) hours as needed (max 6 q). 20 tablet 0   polyethylene glycol (MIRALAX  / GLYCOLAX ) 17 g packet Take 17 g by mouth daily as needed for mild constipation.      ranolazine  (RANEXA ) 500 MG 12 hr tablet TAKE ONE (1) TABLET BY MOUTH TWO TIMES PER DAY 180 tablet 3   spironolactone  (ALDACTONE ) 25 MG tablet  Take 0.5 tablets (12.5 mg total) by mouth daily. 45 tablet 3   tamsulosin  (FLOMAX ) 0.4 MG CAPS capsule TAKE 1 CAPSULE BY MOUTH AT BEDTIME 90 capsule 1   No facility-administered medications prior to visit.     Per HPI unless specifically indicated in ROS section below Review of Systems  Constitutional:  Positive for fatigue. Negative for fever.  HENT:  Negative for ear pain.   Eyes:  Negative for pain.  Respiratory:  Negative for cough and shortness of breath.   Cardiovascular:  Negative for chest  pain, palpitations and leg swelling.  Gastrointestinal:  Negative for abdominal pain.  Genitourinary:  Negative for dysuria.  Musculoskeletal:  Negative for arthralgias.  Neurological:  Positive for dizziness and weakness. Negative for syncope, light-headedness and headaches.  Psychiatric/Behavioral:  Negative for dysphoric mood.    Objective:  BP (!) 116/58 (BP Location: Left Arm, Patient Position: Sitting, Cuff Size: Normal)   Pulse 73   Temp 98.3 F (36.8 C) (Temporal)   Ht 6' (1.829 m)   Wt 213 lb (96.6 kg)   SpO2 96%   BMI 28.89 kg/m   Wt Readings from Last 3 Encounters:  12/28/23 213 lb (96.6 kg)  12/21/23 205 lb (93 kg)  12/20/23 205 lb (93 kg)      Physical Exam Constitutional:      Appearance: He is well-developed.  HENT:     Head: Normocephalic.     Right Ear: Hearing normal.     Left Ear: Hearing normal.     Nose: Nose normal.  Neck:     Thyroid : No thyroid  mass or thyromegaly.     Vascular: No carotid bruit.     Trachea: Trachea normal.  Cardiovascular:     Rate and Rhythm: Normal rate and regular rhythm.     Pulses: Normal pulses.     Heart sounds: Heart sounds not distant. No murmur heard.    No friction rub. No gallop.     Comments: No peripheral edema Pulmonary:     Effort: Pulmonary effort is normal. No respiratory distress.     Breath sounds: Normal breath sounds.  Skin:    General: Skin is warm and dry.     Findings: No rash.  Neurological:      Mental Status: He is lethargic.  Psychiatric:        Speech: Speech normal.        Behavior: Behavior normal.        Thought Content: Thought content normal.       Results for orders placed or performed in visit on 12/28/23  CBC with Differential/Platelet   Collection Time: 12/28/23  4:01 PM  Result Value Ref Range   WBC 6.6 4.0 - 10.5 K/uL   RBC 3.65 (L) 4.22 - 5.81 Mil/uL   Hemoglobin 11.8 (L) 13.0 - 17.0 g/dL   HCT 16.1 (L) 09.6 - 04.5 %   MCV 95.1 78.0 - 100.0 fl   MCHC 33.9 30.0 - 36.0 g/dL   RDW 40.9 81.1 - 91.4 %   Platelets 264.0 150.0 - 400.0 K/uL   Neutrophils Relative % 58.7 43.0 - 77.0 %   Lymphocytes Relative 17.7 12.0 - 46.0 %   Monocytes Relative 17.6 (H) 3.0 - 12.0 %   Eosinophils Relative 5.0 0.0 - 5.0 %   Basophils Relative 1.0 0.0 - 3.0 %   Neutro Abs 3.9 1.4 - 7.7 K/uL   Lymphs Abs 1.2 0.7 - 4.0 K/uL   Monocytes Absolute 1.2 (H) 0.1 - 1.0 K/uL   Eosinophils Absolute 0.3 0.0 - 0.7 K/uL   Basophils Absolute 0.1 0.0 - 0.1 K/uL  Comprehensive metabolic panel with GFR   Collection Time: 12/28/23  4:01 PM  Result Value Ref Range   Sodium 135 135 - 145 mEq/L   Potassium 4.6 3.5 - 5.1 mEq/L   Chloride 98 96 - 112 mEq/L   CO2 27 19 - 32 mEq/L   Glucose, Bld 126 (H) 70 - 99 mg/dL   BUN 44 (H) 6 - 23 mg/dL   Creatinine,  Ser 1.79 (H) 0.40 - 1.50 mg/dL   Total Bilirubin 0.6 0.2 - 1.2 mg/dL   Alkaline Phosphatase 35 (L) 39 - 117 U/L   AST 29 0 - 37 U/L   ALT 10 0 - 53 U/L   Total Protein 6.6 6.0 - 8.3 g/dL   Albumin  3.7 3.5 - 5.2 g/dL   GFR 29.56 (L) >21.30 mL/min   Calcium  8.8 8.4 - 10.5 mg/dL  Brain natriuretic peptide   Collection Time: 12/28/23  4:01 PM  Result Value Ref Range   Pro B Natriuretic peptide (BNP) 329.0 (H) 0.0 - 100.0 pg/mL  POCT Urinalysis Dipstick (Automated)   Collection Time: 12/28/23  4:05 PM  Result Value Ref Range   Color, UA yellow    Clarity, UA clear    Glucose, UA Negative Negative   Bilirubin, UA neg    Ketones, UA neg     Spec Grav, UA 1.015 1.010 - 1.025   Blood, UA neg    pH, UA 5.5 5.0 - 8.0   Protein, UA Negative Negative   Urobilinogen, UA 0.2 0.2 or 1.0 E.U./dL   Nitrite, UA neg    Leukocytes, UA Negative Negative   *Note: Due to a large number of results and/or encounters for the requested time period, some results have not been displayed. A complete set of results can be found in Results Review.    Assessment and Plan  Somnolence Assessment & Plan: Recent change in awareness level most likely due to using narcotics in an elderly gentleman.  He is now off these and should have less sedation over time.  We will also evaluate with lab work to rule out other possible causes of oversedation. No evidence of urinary tract infection as cause of decreased mental status.  Return and ER precautions were provided  Orders: -     CBC with Differential/Platelet -     Comprehensive metabolic panel with GFR  Peripheral edema -     Brain natriuretic peptide  HFrEF (heart failure with reduced ejection fraction) (HCC)  Urinary incontinence, unspecified type Assessment & Plan: Acute, no evidence of urinary tract infection.  Orders: -     POCT Urinalysis Dipstick (Automated)    No follow-ups on file.   Herby Lolling, MD

## 2023-12-28 NOTE — Telephone Encounter (Signed)
 Chief Complaint: leg swelling  Symptoms: leg swelling and fatigue Frequency: since 5/8 Pertinent Negatives: Patient denies sob Disposition: [] ED /[] Urgent Care (no appt availability in office) / [x] Appointment(In office/virtual)/ []  [facility]/ [] Home Care/ [] Refused Recommended Disposition /[] [facility]/ [x]  Follow-up with PCP Additional Notes: pt states that he is experiencing swelling of both legs.  States he has been experiencing swelling since 12/21/23.  States swelling goes up about mid calf and states pain when walking. States that he also can't stay awake. States that elevation does not help with swelling. State his surgeon told him to reach out to PCP.   Copied from CRM [id number]. Topic: Clinical - Red Word Triage >> Dec 28, 2023 12:11 PM Clyde Darling P wrote: Red Word that prompted transfer to Nurse Triage: Shoulder surgery 05/8 - swelling / cant stay awake - not taking any pain medicine / leg swelling Reason for Disposition  [1] MODERATE leg swelling (e.g., swelling extends up to knees) AND [2] new-onset or worsening  Answer Assessment - Initial Assessment Questions 1. ONSET: "When did the swelling start?" (e.g., minutes, hours, days)     12/21/23 2. LOCATION: "What part of the leg is swollen?"  "Are both legs swollen or just one leg?"     Both legs 3. SEVERITY: "How bad is the swelling?" (e.g., localized; mild, moderate, severe)   - Localized: Small area of swelling localized to one leg.   - MILD pedal edema: Swelling limited to foot and ankle, pitting edema < 1/4 inch (6 mm) deep, rest and elevation eliminate most or all swelling.   - MODERATE edema: Swelling of lower leg to knee, pitting edema > 1/4 inch (6 mm) deep, rest and elevation only partially reduce swelling.   - SEVERE edema: Swelling extends above knee, facial or hand swelling present.      mod 4. REDNESS: "Does the swelling look red or infected?"     no 5. PAIN: "Is the swelling painful to touch?" If  Yes, ask: "How painful is it?"   (Scale 1-10; mild, moderate or severe)     mild 6. FEVER: "Do you have a fever?" If Yes, ask: "What is it, how was it measured, and when did it start?"      no 7. CAUSE: "What do you think is causing the leg swelling?"     unknown 8. MEDICAL HISTORY: "Do you have a history of blood clots (e.g., DVT), cancer, heart failure, kidney disease, or liver failure?"     yes 9. RECURRENT SYMPTOM: "Have you had leg swelling before?" If Yes, ask: "When was the last time?" "What happened that time?"Long time ago      10. OTHER SYMPTOMS: "Do you have any other symptoms?" (e.g., chest pain, difficulty breathing)       Fatigue and shoulder swelling.  Protocols used: Leg Swelling and Edema-A-AH

## 2023-12-28 NOTE — Patient Instructions (Addendum)
 If labs stable increase lasix  to  40 mg  for 2-3 days.  Remain off pain med.. use tylenol  if able for pain.   If severe SOB.Aaron Aas or persistent fever... go toER.

## 2023-12-29 LAB — COMPREHENSIVE METABOLIC PANEL WITH GFR
ALT: 10 U/L (ref 0–53)
AST: 29 U/L (ref 0–37)
Albumin: 3.7 g/dL (ref 3.5–5.2)
Alkaline Phosphatase: 35 U/L — ABNORMAL LOW (ref 39–117)
BUN: 44 mg/dL — ABNORMAL HIGH (ref 6–23)
CO2: 27 meq/L (ref 19–32)
Calcium: 8.8 mg/dL (ref 8.4–10.5)
Chloride: 98 meq/L (ref 96–112)
Creatinine, Ser: 1.79 mg/dL — ABNORMAL HIGH (ref 0.40–1.50)
GFR: 34.8 mL/min — ABNORMAL LOW (ref >60.00)
Glucose, Bld: 126 mg/dL — ABNORMAL HIGH (ref 70–99)
Potassium: 4.6 meq/L (ref 3.5–5.1)
Sodium: 135 meq/L (ref 135–145)
Total Bilirubin: 0.6 mg/dL (ref 0.2–1.2)
Total Protein: 6.6 g/dL (ref 6.0–8.3)

## 2023-12-29 LAB — CBC WITH DIFFERENTIAL/PLATELET
Basophils Absolute: 0.1 10*3/uL (ref 0.0–0.1)
Basophils Relative: 1 % (ref 0.0–3.0)
Eosinophils Absolute: 0.3 10*3/uL (ref 0.0–0.7)
Eosinophils Relative: 5 % (ref 0.0–5.0)
HCT: 34.7 % — ABNORMAL LOW (ref 39.0–52.0)
Hemoglobin: 11.8 g/dL — ABNORMAL LOW (ref 13.0–17.0)
Lymphocytes Relative: 17.7 % (ref 12.0–46.0)
Lymphs Abs: 1.2 10*3/uL (ref 0.7–4.0)
MCHC: 33.9 g/dL (ref 30.0–36.0)
MCV: 95.1 fl (ref 78.0–100.0)
Monocytes Absolute: 1.2 10*3/uL — ABNORMAL HIGH (ref 0.1–1.0)
Monocytes Relative: 17.6 % — ABNORMAL HIGH (ref 3.0–12.0)
Neutro Abs: 3.9 10*3/uL (ref 1.4–7.7)
Neutrophils Relative %: 58.7 % (ref 43.0–77.0)
Platelets: 264 10*3/uL (ref 150.0–400.0)
RBC: 3.65 Mil/uL — ABNORMAL LOW (ref 4.22–5.81)
RDW: 13.4 % (ref 11.5–15.5)
WBC: 6.6 10*3/uL (ref 4.0–10.5)

## 2024-01-01 LAB — BRAIN NATRIURETIC PEPTIDE: Pro B Natriuretic peptide (BNP): 329 pg/mL — ABNORMAL HIGH (ref 0.0–100.0)

## 2024-01-02 ENCOUNTER — Ambulatory Visit: Payer: Self-pay | Admitting: Family Medicine

## 2024-01-02 ENCOUNTER — Telehealth: Payer: Self-pay | Admitting: Family Medicine

## 2024-01-02 NOTE — Telephone Encounter (Addendum)
 My Chart Message Seen by patient [name] on 01/02/2024  2:08 PM

## 2024-01-02 NOTE — Telephone Encounter (Signed)
 Please see MyChart message.  Call if patient does not view by the end of the day.

## 2024-01-02 NOTE — Telephone Encounter (Signed)
 Copied from CRM [id number]. Topic: Clinical - Lab/Test Results >> Jan 02, 2024  8:29 AM Rosaria Common wrote: Reason for CRM: Pt received blood work results and would like further clarity.

## 2024-01-02 NOTE — Telephone Encounter (Addendum)
 Pt saw Dr Cherlyn Cornet on 12/28/23 and labs were ordered. Will fwd to her for advise.

## 2024-01-03 ENCOUNTER — Other Ambulatory Visit: Admitting: Urology

## 2024-01-03 DIAGNOSIS — Z5189 Encounter for other specified aftercare: Secondary | ICD-10-CM | POA: Diagnosis not present

## 2024-01-03 DIAGNOSIS — Z96611 Presence of right artificial shoulder joint: Secondary | ICD-10-CM | POA: Diagnosis not present

## 2024-01-05 ENCOUNTER — Other Ambulatory Visit

## 2024-01-05 ENCOUNTER — Other Ambulatory Visit: Admitting: Urology

## 2024-01-05 DIAGNOSIS — I5020 Unspecified systolic (congestive) heart failure: Secondary | ICD-10-CM | POA: Insufficient documentation

## 2024-01-05 DIAGNOSIS — D494 Neoplasm of unspecified behavior of bladder: Secondary | ICD-10-CM | POA: Diagnosis not present

## 2024-01-05 DIAGNOSIS — R32 Unspecified urinary incontinence: Secondary | ICD-10-CM | POA: Insufficient documentation

## 2024-01-05 LAB — URINALYSIS, COMPLETE
Bilirubin, UA: NEGATIVE
Glucose, UA: NEGATIVE
Ketones, UA: NEGATIVE
Leukocytes,UA: NEGATIVE
Nitrite, UA: NEGATIVE
Protein,UA: NEGATIVE
RBC, UA: NEGATIVE
Specific Gravity, UA: <1.005 — ABNORMAL LOW (ref 1.005–1.030)
Urobilinogen, Ur: 0.2 mg/dL (ref 0.2–1.0)
pH, UA: 5.5 (ref 5.0–7.5)

## 2024-01-05 LAB — MICROSCOPIC EXAMINATION

## 2024-01-05 NOTE — Assessment & Plan Note (Signed)
 Recent change in awareness level most likely due to using narcotics in an elderly gentleman.  He is now off these and should have less sedation over time.  We will also evaluate with lab work to rule out other possible causes of oversedation. No evidence of urinary tract infection as cause of decreased mental status.  Return and ER precautions were provided

## 2024-01-05 NOTE — Assessment & Plan Note (Signed)
 Acute, no evidence of urinary tract infection.

## 2024-01-08 ENCOUNTER — Inpatient Hospital Stay: Admission: RE | Admit: 2024-01-08 | Source: Ambulatory Visit

## 2024-01-08 LAB — CULTURE, URINE COMPREHENSIVE

## 2024-01-09 ENCOUNTER — Other Ambulatory Visit: Payer: Self-pay

## 2024-01-09 ENCOUNTER — Encounter
Admission: RE | Admit: 2024-01-09 | Discharge: 2024-01-09 | Disposition: A | Discharge status: Home / Self Care | Source: Ambulatory Visit | Attending: Urology | Admitting: Urology

## 2024-01-09 ENCOUNTER — Encounter: Payer: Self-pay | Admitting: Urology

## 2024-01-09 NOTE — Patient Instructions (Addendum)
 Your procedure is scheduled on: 01/16/24 - Tuesday Report to the Registration Desk on the 1st floor of the [facility]. To find out your arrival time, please call [phone] between 1PM - 3PM on: 01/15/24 - Monday If your arrival time is 6:00 am, do not arrive before that time as the [facility] entrance doors do not open until 6:00 am.  REMEMBER: Instructions that are not followed completely may result in serious medical risk, up to and including death; or upon the discretion of your surgeon and anesthesiologist your surgery may need to be rescheduled.  Do not eat food or drink any liquids after midnight the night before surgery.  No gum chewing or hard candies.  One week prior to surgery: Stop Anti-inflammatories (NSAIDS) such as Advil, Aleve, Ibuprofen, Motrin, Naproxen, Naprosyn and Aspirin  based products such as Excedrin, Goody's Powder, BC Powder. You may however, continue to take Tylenol  if needed for pain up until the day of surgery.  Stop ANY OVER THE COUNTER supplements until after surgery : VITAMIN D3   Continue taking all of your other prescription medications up until the day before surgery.  ON THE DAY OF SURGERY ONLY TAKE THESE MEDICATIONS WITH SIPS OF WATER :  albuterol  (PROVENTIL ) (2.5 MG/3ML) 0.083% nebulizer  carvedilol  (COREG )  famotidine  (PEPCID )  TRELEGY ELLIPTA   gabapentin  (NEURONTIN )  ranolazine  (RANEXA )   Use inhalers albuterol  (VENTOLIN  HFA)  on the day of surgery and bring to the hospital.  No Alcohol for 24 hours before or after surgery.  No Smoking including e-cigarettes for 24 hours before surgery.  No chewable tobacco products for at least 6 hours before surgery.  No nicotine patches on the day of surgery.  Do not use any "recreational" drugs for at least a week (preferably 2 weeks) before your surgery.  Please be advised that the combination of cocaine and anesthesia may have negative outcomes, up to and including death. If you test positive  for cocaine, your surgery will be cancelled.  On the morning of surgery brush your teeth with toothpaste and water , you may rinse your mouth with mouthwash if you wish. Do not swallow any toothpaste or mouthwash.  Do not wear jewelry, make-up, hairpins, clips or nail polish.  For welded (permanent) jewelry: bracelets, anklets, waist bands, etc.  Please have this removed prior to surgery.  If it is not removed, there is a chance that hospital personnel will need to cut it off on the day of surgery.  Do not wear lotions, powders, or perfumes.   Do not shave body hair from the neck down 48 hours before surgery.  Contact lenses, hearing aids and dentures may not be worn into surgery.  Do not bring valuables to the hospital. [facility] is not responsible for any missing/lost belongings or valuables.   Bring your C-PAP to the hospital in case you may have to spend the night.   Notify your doctor if there is any change in your medical condition (cold, fever, infection).  Wear comfortable clothing (specific to your surgery type) to the hospital.  After surgery, you can help prevent lung complications by doing breathing exercises.  Take deep breaths and cough every 1-2 hours. Your doctor may order a device called an Incentive Spirometer to help you take deep breaths. When coughing or sneezing, hold a pillow firmly against your incision with both hands. This is called "splinting." Doing this helps protect your incision. It also decreases belly discomfort.  If you are being admitted to the hospital  overnight, leave your suitcase in the car. After surgery it may be brought to your room.  In case of increased patient census, it may be necessary for you, the patient, to continue your postoperative care in the Same Day Surgery department.  If you are being discharged the day of surgery, you will not be allowed to drive home. You will need a responsible individual to drive you home and stay with you  for 24 hours after surgery.   If you are taking public transportation, you will need to have a responsible individual with you.  Please call the Pre-admissions Testing Dept. at [phone] if you have any questions about these instructions.  Surgery Visitation Policy:  Patients having surgery or a procedure may have two visitors.  Children under the age of 86 must have an adult with them who is not the patient.  Inpatient Visitation:    Visiting hours are 7 a.m. to 8 p.m. Up to four visitors are allowed at one time in a patient room. The visitors may rotate out with other people during the day.  One visitor age 40 or older may stay with the patient overnight and must be in the room by 8 p.m.

## 2024-01-10 ENCOUNTER — Ambulatory Visit (INDEPENDENT_AMBULATORY_CARE_PROVIDER_SITE_OTHER): Payer: HMO | Admitting: Family Medicine

## 2024-01-10 ENCOUNTER — Encounter: Payer: Self-pay | Admitting: Family Medicine

## 2024-01-10 ENCOUNTER — Other Ambulatory Visit: Payer: Self-pay | Admitting: Cardiovascular Disease

## 2024-01-10 VITALS — BP 118/64 | HR 69 | Temp 98.2°F | Ht 71.75 in | Wt 212.4 lb

## 2024-01-10 DIAGNOSIS — N1831 Chronic kidney disease, stage 3a: Secondary | ICD-10-CM

## 2024-01-10 DIAGNOSIS — M75101 Unspecified rotator cuff tear or rupture of right shoulder, not specified as traumatic: Secondary | ICD-10-CM

## 2024-01-10 DIAGNOSIS — J4489 Other specified chronic obstructive pulmonary disease: Secondary | ICD-10-CM

## 2024-01-10 DIAGNOSIS — M12811 Other specific arthropathies, not elsewhere classified, right shoulder: Secondary | ICD-10-CM

## 2024-01-10 DIAGNOSIS — C679 Malignant neoplasm of bladder, unspecified: Secondary | ICD-10-CM

## 2024-01-10 DIAGNOSIS — R600 Localized edema: Secondary | ICD-10-CM

## 2024-01-10 DIAGNOSIS — G6289 Other specified polyneuropathies: Secondary | ICD-10-CM

## 2024-01-10 DIAGNOSIS — I25110 Atherosclerotic heart disease of native coronary artery with unstable angina pectoris: Secondary | ICD-10-CM | POA: Diagnosis not present

## 2024-01-10 DIAGNOSIS — I5032 Chronic diastolic (congestive) heart failure: Secondary | ICD-10-CM

## 2024-01-10 LAB — RENAL FUNCTION PANEL
Albumin: 4.1 g/dL (ref 3.5–5.2)
BUN: 28 mg/dL — ABNORMAL HIGH (ref 6–23)
CO2: 28 meq/L (ref 19–32)
Calcium: 9.1 mg/dL (ref 8.4–10.5)
Chloride: 103 meq/L (ref 96–112)
Creatinine, Ser: 1.55 mg/dL — ABNORMAL HIGH (ref 0.40–1.50)
GFR: 41.35 mL/min — ABNORMAL LOW (ref >60.00)
Glucose, Bld: 106 mg/dL — ABNORMAL HIGH (ref 70–99)
Phosphorus: 4.1 mg/dL (ref 2.3–4.6)
Potassium: 4.6 meq/L (ref 3.5–5.1)
Sodium: 139 meq/L (ref 135–145)

## 2024-01-10 NOTE — Assessment & Plan Note (Addendum)
 Baseline GFR 50s.  Update renal function today

## 2024-01-10 NOTE — Progress Notes (Unsigned)
 Ph: [phone] Fax: [phone]   Patient ID: [name], male    DOB: 1940/11/28, 83 y.o.   MRN: [id number]  This visit was conducted in person.  BP 118/64   Pulse 69   Temp 98.2 F (36.8 C) (Oral)   Ht 5' 11.75" (1.822 m)   Wt 212 lb 6 oz (96.3 kg)   SpO2 95%   BMI 29.00 kg/m    CC: f/u leg swelling Subjective:   HPI: [name] is a 83 y.o. male presenting on 01/10/2024 for Medical Management of Chronic Issues (Here for f/u.)   Last AMW and CPE 02/2023. Has AMW with [facility] scheduled for 02/22/2024.   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  Saw Dr Cherlyn Cornet - bilateral leg swelling as well as marked sedation after oxycodone  use.  Labs showed elevated BNP and Cr - rec increased lasix  x2-3 days.  Notes leg swelling improved on lasix . 10 lb weight gain since then. Attributes to fluid accumulation to RUE.  No chest pain, dyspnea, palpitations, dizziness.   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   Recurrent bladder cancer - pending cystoscopy with biopsy next week [facility]).   PAD - saw Dr Alvenia Aus 12/2023. Known CAD s/p PCI with DES to L circ 12/2022. Plavix  discontinued due to extensive bruising.   Notes ongoing pil and solid dysphagia since cervical surgery 06/2022.   New upper and lower dentures.      Relevant past medical, surgical, family and social history reviewed and updated as indicated. Interim medical history since our last visit reviewed. Allergies and medications reviewed and updated. Outpatient Medications Prior to Visit  Medication Sig Dispense Refill   albuterol  (PROVENTIL ) (2.5 MG/3ML) 0.083% nebulizer solution TAKE 3 MLS BY NEBULIZATION EVERY 6 HOURSAS NEEDED FOR WHEEZING OR SHORTNESS OF BREATH 75 mL 12   albuterol  (VENTOLIN  HFA) 108 (90 Base) MCG/ACT inhaler TAKE 2 PUFFS INTO LUNGS EVERY 6 HOURS ASNEEDED FOR WHEEZING OR SHORTNESS OF BREATH 8.5 each 3    amoxicillin  (AMOXIL ) 500 MG capsule TAKE 4 CAPSULES 30-60 MINUTES PRIOR TO PROCEDURE 4 capsule 1   aspirin  EC 81 MG EC tablet Take 1 tablet (81 mg total) by mouth daily. 30 tablet 0   atorvastatin  (LIPITOR) 20 MG tablet TAKE 1 TABLET BY MOUTH DAILY 90 tablet 3   carvedilol  (COREG ) 3.125 MG tablet TAKE 1 TABLET BY MOUTH TWICE DAILY 180 tablet 2   Cholecalciferol (VITAMIN D3) 25 MCG (1000 UT) CAPS Take 1 capsule (1,000 Units total) by mouth 2 (two) times a week.     Cyanocobalamin  (VITAMIN B-12 ER PO) Take 1 capsule by mouth daily.     cyclobenzaprine  (FLEXERIL ) 10 MG tablet Take 0.5 tablets (5 mg total) by mouth 3 (three) times daily as needed for muscle spasms. 30 tablet 1   docusate sodium  (COLACE) 100 MG capsule Take 1 capsule (100 mg total) by mouth daily as needed for mild constipation.     Dupilumab (DUPIXENT) 300 MG/2ML SOAJ Inject 300 mg into the skin every 14 (fourteen) days. **patient completed loading dose in clinic** 12 mL 1   ezetimibe  (ZETIA ) 10 MG tablet TAKE 1 TABLET BY MOUTH DAILY 90 tablet 0   famotidine  (PEPCID ) 20 MG tablet Take 20 mg by mouth daily.     fluticasone  (FLONASE ) 50 MCG/ACT nasal spray Place 1 spray into both nostrils daily as needed for allergies or rhinitis.     Fluticasone -Umeclidin-Vilant (TRELEGY ELLIPTA )  200-62.5-25 MCG/ACT AEPB Inhale 1 puff into the lungs daily. 180 each 3   furosemide  (LASIX ) 40 MG tablet Take 0.5 tablets (20 mg total) by mouth daily as needed. Take 1 tablet (40 mg) daily as need for weight greater then 208. 180 tablet 3   gabapentin  (NEURONTIN ) 600 MG tablet Take 1 tablet (600 mg total) by mouth 3 (three) times daily. 270 tablet 4   hydrocortisone  2.5 % cream Apply topically 2 (two) times daily as needed (Rash). 30 g 5   isosorbide  mononitrate (IMDUR ) 30 MG 24 hr tablet TAKE 1 TABLET BY MOUTH DAILY. 90 tablet 3   montelukast  (SINGULAIR ) 10 MG tablet TAKE 1 TABLET BY MOUTH DAILY 30 tablet 11   mupirocin  ointment (BACTROBAN ) 2 % Apply 1  Application topically 2 (two) times daily. (Patient taking differently: Apply 1 Application topically daily as needed (irritation).) 22 g 0   nitrofurantoin , macrocrystal-monohydrate, (MACROBID ) 100 MG capsule Take 1 capsule (100 mg total) by mouth 2 (two) times daily. 14 capsule 0   nitroGLYCERIN  (NITROSTAT ) 0.4 MG SL tablet Place 1 tablet (0.4 mg total) under the tongue every 5 (five) minutes as needed for chest pain. 25 tablet 3   ondansetron  (ZOFRAN ) 4 MG tablet Take 1 tablet (4 mg total) by mouth every 8 (eight) hours as needed for nausea or vomiting. 10 tablet 0   polyethylene glycol (MIRALAX  / GLYCOLAX ) 17 g packet Take 17 g by mouth daily as needed for mild constipation.      ranolazine  (RANEXA ) 500 MG 12 hr tablet TAKE ONE (1) TABLET BY MOUTH TWO TIMES PER DAY 180 tablet 3   spironolactone  (ALDACTONE ) 25 MG tablet Take 0.5 tablets (12.5 mg total) by mouth daily. 45 tablet 3   tamsulosin  (FLOMAX ) 0.4 MG CAPS capsule TAKE 1 CAPSULE BY MOUTH AT BEDTIME 90 capsule 1   losartan  (COZAAR ) 25 MG tablet Take 1 tablet (25 mg total) by mouth daily. 90 tablet 3   No facility-administered medications prior to visit.     Per HPI unless specifically indicated in ROS section below Review of Systems  Objective:  BP 118/64   Pulse 69   Temp 98.2 F (36.8 C) (Oral)   Ht 5' 11.75" (1.822 m)   Wt 212 lb 6 oz (96.3 kg)   SpO2 95%   BMI 29.00 kg/m   Wt Readings from Last 3 Encounters:  01/10/24 212 lb 6 oz (96.3 kg)  12/28/23 213 lb (96.6 kg)  12/21/23 205 lb (93 kg)      Physical Exam Vitals and nursing note reviewed.  Constitutional:      Appearance: Normal appearance. [name] is not ill-appearing.     Comments: Wearing R arm sling  HENT:     Head: Normocephalic and atraumatic.     Mouth/Throat:     Mouth: Mucous membranes are moist.     Pharynx: Oropharynx is clear. No oropharyngeal exudate or posterior oropharyngeal erythema.     Comments: New full dentures Eyes:     Extraocular  Movements: Extraocular movements intact.     Pupils: Pupils are equal, round, and reactive to light.  Cardiovascular:     Rate and Rhythm: Normal rate and regular rhythm.     Pulses: Normal pulses.     Heart sounds: Normal heart sounds. No murmur heard. Pulmonary:     Effort: Pulmonary effort is normal. No respiratory distress.     Breath sounds: Normal breath sounds. No wheezing, rhonchi or rales.  Musculoskeletal:  Cervical back: Normal range of motion and neck supple.     Right lower leg: No edema.     Left lower leg: No edema.  Skin:    General: Skin is warm and dry.     Findings: No rash.  Neurological:     Mental Status: [name] is alert.  Psychiatric:        Mood and Affect: Mood normal.        Behavior: Behavior normal.       Lab Results  Component Value Date   VITAMINB12 301 02/28/2023   Lab Results  Component Value Date   NA 135 12/28/2023   CL 98 12/28/2023   K 4.6 12/28/2023   CO2 27 12/28/2023   BUN 44 (H) 12/28/2023   CREATININE 1.79 (H) 12/28/2023   GFR 34.80 (L) 12/28/2023   CALCIUM  8.8 12/28/2023   PHOS 3.7 02/28/2023   ALBUMIN  3.7 12/28/2023   GLUCOSE 126 (H) 12/28/2023   Lab Results  Component Value Date   WBC 6.6 12/28/2023   HGB 11.8 (L) 12/28/2023   HCT 34.7 (L) 12/28/2023   MCV 95.1 12/28/2023   PLT 264.0 12/28/2023   Assessment & Plan:   Problem List Items Addressed This Visit     Peripheral neuropathy - Primary   Chronic presumed diabetic, managed with gabapentin  600mg  TID. Notes if [name] misses a dose symptoms worsen.  Discussed trial titration - drop mid day dose to 300mg  (1/2 tablet). Continue 500mg  am and pm.       CKD (chronic kidney disease) stage 3, GFR 30-59 ml/min (HCC)   Baseline GFR 50s.  Update renal function today       Relevant Orders   Renal function panel     No orders of the defined types were placed in this encounter.   Orders Placed This Encounter  Procedures   Renal function panel    Patient  Instructions  Labs today to recheck kidneys.  Continue monitoring weight.  Return in 2 months for physical  Good to see you today.  I hope you have a speedy recovery from upcoming bladder surgery.   Follow up plan: Return in about 2 months (around 03/11/2024) for annual exam, prior fasting for blood work.  Claire Crick, MD

## 2024-01-10 NOTE — Assessment & Plan Note (Addendum)
 Chronic presumed diabetic, managed with gabapentin  600mg  TID. Notes if he misses a dose symptoms worsen.  Discussed trial titration - drop mid day dose to 300mg  (1/2 tablet). Continue 500mg  am and pm.

## 2024-01-10 NOTE — Patient Instructions (Addendum)
 Labs today to recheck kidneys.  Continue monitoring weight.  Return in 2 months for physical  Good to see you today.  I hope you have a speedy recovery from upcoming bladder surgery.

## 2024-01-11 ENCOUNTER — Ambulatory Visit: Payer: Self-pay | Admitting: Family Medicine

## 2024-01-11 ENCOUNTER — Encounter: Payer: Self-pay | Admitting: Family Medicine

## 2024-01-11 NOTE — Assessment & Plan Note (Signed)
 This has improved with temporary increase in lasix  dosing.

## 2024-01-11 NOTE — Assessment & Plan Note (Signed)
 Appreciate urology care - pending cystoscopy next month for retreatment.

## 2024-01-11 NOTE — Assessment & Plan Note (Signed)
 Appreciate pulmonology care - stable period on dupixent with trelegy and albuterol  PRN.

## 2024-01-11 NOTE — Assessment & Plan Note (Addendum)
 Stable period on losartan , lasix , spironolactone , carvedilol . Seems euvolemic currently with resolution of pedal edema.

## 2024-01-11 NOTE — Assessment & Plan Note (Signed)
 S/p R reverse shoulder arthroplasty 12/2023 (Supple)

## 2024-01-11 NOTE — Assessment & Plan Note (Addendum)
 Continues statin, zetia , aspirin . Plavix  was stopped due to extensive bruising

## 2024-01-12 ENCOUNTER — Encounter: Payer: Self-pay | Admitting: Urgent Care

## 2024-01-12 NOTE — Progress Notes (Signed)
 Perioperative / Anesthesia Services  Pre-Admission Testing Clinical Review / Pre-Operative Anesthesia Consult  Date: 01/12/24  PATIENT DEMOGRAPHICS: Name: [name] DOB: [birth year] MRN:   [id number]  Note: Available PAT nursing documentation and vital signs have been reviewed. Clinical nursing staff has updated patient's PMH/PSHx, current medication list, and drug allergies/intolerances to ensure complete and comprehensive history available to assist care teams in MDM as it pertains to the aforementioned surgical procedure and anticipated anesthetic course. Extensive review of available clinical information personally performed. [name] PMH and PSHx updated with any diagnoses/procedures that  may have been inadvertently omitted during his intake with the pre-admission testing department's nursing staff.  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 male who is submitted for pre-surgical anesthesia review and clearance prior to him undergoing the above procedure. Patient is a Former Smoker (25 pack years; quit 08/1988). Pertinent PMH includes: CAD (s/p CABG), MI x 2, aortic stenosis, ischemic cardiomyopathy, HFrEF, pulmonary hypertension, ectatic abdominal aorta, CRAO of LEFT eye, PAD, DVT, angina, carotid artery disease, LBBB, cardiac murmur, palpitations, HTN, HLD, T2DM, COPD, OSAH (requires nocturnal PAP therapy), emphysema, GERD (on daily H2 blocker), hiatal hernia, BPH, chronic prostatitis, bladder tumors, lumbar disc disease with radiculopathy, cervical DDD (s/p ACDF C4-C6),  neuropathy in BILATERAL feet, OA, anxiety.   Patient is followed by cardiology (Gollan, MD). He  was last seen in the cardiology clinic on 12/14/2023; notes reviewed. At the time of his clinic visit, patient doing well overall from a cardiovascular perspective.  Patient with complaints of mild exertional dyspnea and fatigue.  Patient denied any chest pain,  PND, orthopnea, palpitations, significant peripheral edema, weakness, vertiginous symptoms, or presyncope/syncope. Patient with a past medical history significant for cardiovascular diagnoses. Documented physical exam was grossly benign, providing no evidence of acute exacerbation and/or decompensation of the patient's known cardiovascular conditions.Of note, complete records regarding patient's cardiovascular history unavailable for review at time of consult.  Information gathered from patient report and from notes provided by his local cardiologist.   Patient suffered an MI in 50.  Cardiovascular event resulted and a three-vessel revascularization procedure.  LIMA-LAD, SVG-D1, SVG-RI bypass grafts were placed.   Patient suffered a second MI and 2002. He underwent diagnostic LEFT heart catheterization in 2002 resulting in PCI and subsequent BMS (unknown type) placement to the LCx.   Patient underwent diagnostic LEFT heart catheterization on 07/07/2015 revealing multivessel CAD; 50% ostial LM-LM, 80% ostial RCA, 80% proximal RCA-1, 80% proximal RCA-2, 60% distal LAD, 100% ostial to proximal LAD, 60% mid LAD, and 50% D2.  LIMA-LAD and SVG-D2 bypass grafts were patent.  The decision was made to defer further intervention opting for medical management.   Most recent myocardial perfusion imaging study was performed on 12/27/2022 revealing a severely reduced left ventricular systolic function with an EF of 25-35%. Post-CABG findings, coronary artery calcification, and aortic atherosclerosis are noted on the attenuation correction CT. SPECT images demonstrated na moderate in size, severe, partially reversible basal and mid inferior/inferolateral defect  consistent with ischemia and an element of scar. TID ratio = 1.05. Study determined to be abnormal and high risk.   Patient underwent diagnostic RIGHT/LEFT heart  catheterization on 01/13/2023 demonstrating moderately reduced left ventricular systolic dysfunction with an EF of 35 to 40%. Procedure revealed multivessel CAD; 50% ostial LM-LM, 100% ostial-proximal LAD, 60% mid LAD, 50% D2, 99% proximal RCA-1, 9% proximal RCA-2, 5% proximal-mid LCx, 40% ostial-proximal LCx, 99% mid-distal LCx, 50% distal-LCx, and 40% distal LAD.  Previously placed bypass grafts were noted to be patent.  Hemodynamics: mean RA = 10 mmHg, mean PA = 40 mmHg, mean PCWP = 28 mmHg, QP/QS = 1, CO = 8.81 L/min, and CI = 4.08 L/min/m.  PCI was subsequently performed placing a 3.0 x 18 mm DES to the mid-distal LCx.  Interventional cardiology noted difficulty with procedure due to calcifications and subtotal occlusion with difficulty crossing the wire.  Ultimately, procedure was successful and resulted in a 10% residual stenosis following stent placement.  Most recent TTE performed on 09/05/2023 revealed a mildly reduced left ventricular systolic function with an EF of 40-45%.  There was global hypokinesis.   Left ventricular diastolic Doppler parameters consistent with abnormal relaxation (G1DD).  Right ventricular size and function normal.  Left atrium was mildly dilated.  There was mild mitral annular calcification.  Mild mitral and pulmonic valve regurgitation was observed.  All transvalvular gradients were noted to be normal providing no evidence suggestive of valvular stenosis. Aorta normal in size with no evidence of ectasia or aneurysmal dilatation.  Long-term cardiac event monitor study performed on 11/07/2023 revealing a predominant underlying normal sinus rhythm at an average rate of 73 bpm; range 44-131 bpm.  Bundle branch block/IVCD present.  Rare atrial and ventricular ectopy was observed.  There were no sustained arrhythmias or  prolonged pauses noted.  No patient triggered events were recorded.  Blood pressure well controlled at 118/68 mmHg on currently prescribed GDMT, including beta-blocker (carvedilol ) diuretic (furosemide  + spironolactone ), ARB (losartan ) and nitrate (isosorbide  mononitrate) therapies.  Patient uses ranolazine  for added antianginal benefits.  He is on atorvastatin  + ezetimibe  for his HLD diagnosis and ASCVD prevention.  T2DM well-controlled with diet lifestyle modification alone.  Most recent hemoglobin A1c was 6.0% when checked on 12/13/2023.  Patient does have an OSAH diagnosis and is reported be compliant with prescribed nocturnal PAP therapy.  Patient does experience issues with facemask tolerance.  Patient's functional capacity limited by age and multiple medical comorbidities.  With that said, patient able to complete his ADLs/IADLs without significant cardiovascular limitation.  Per the [facility], patient is able to achieve at least 4 METS of physical activity without experiencing any significant degrees of angina/anginal equivalent symptoms.  No changes were made to his medication regimen.  Patient follow-up with outpatient cardiology and 3 months or sooner if needed.  Ninette Basque is scheduled for an elective CYSTOSCOPY, WITH BIOPSY on 01/16/2024 with Dr. Darlynn Elam, MD. Given patient's past medical history significant for cardiovascular diagnoses, presurgical cardiac clearance was sought by the PAT team. Per cardiology, "the patient has no anginal symptoms and remains stable from a cardiac standpoint and spite of complex cardiovascular history. He can proceed with surgery at an overall moderate risk. Plavix  was discontinued today with no need to resume postop. He should continue to take aspirin  81 mg daily without interruption".  In review of the patient's chart, it is noted that he is on daily oral antithrombotic therapy. Given that patient's past medical history is significant for cardiovascular  diagnoses, including but not limited to CAD, urology has cleared patient to continue his daily low dose ASA throughout his perioperative course.  Patient has been  updated on these directives from his specialty care providers by the PAT team.  Patient reports previous perioperative complications with anesthesia in the past. Patient found to have a (+) difficult airway in the past due to anterior laryngeal anatomy and reduced cervical ROM. In review his EMR, it is noted that patient underwent a general anesthetic course at [facility]  ([facility]) in 12/2023 without documented complications.   MOST RECENT VITAL SIGNS:    01/10/2024   10:17 AM 01/10/2024    9:22 AM 12/28/2023    3:04 PM  Vitals with BMI  Height  5' 11.75" 6\' 0"   Weight 212 lbs 6 oz 224 lbs 6 oz 213 lbs  BMI 29.02 30.66 28.88  Systolic  118 116  Diastolic  64 58  Pulse  69 73   PROVIDERS/SPECIALISTS: NOTE: Primary physician provider listed below. Patient may have been seen by APP or partner within same practice.   PROVIDER ROLE / SPECIALTY LAST Adan Holms, MD Urology (Surgeon) 12/06/2023  Claire Crick, MD Primary Care Provider 01/10/2024  Belva Boyden, MD Cardiology 12/14/2023  Coralie Derrick, MD Pulmonary Medicine 12/20/2023   ALLERGIES: Allergies  Allergen Reactions   Vioxx [Rofecoxib] Other (See Comments)    Hemorrhage    Oxycodone  Other (See Comments)    Could not wake up , felt drunk   Spiriva  Respimat [Tiotropium Bromide Monohydrate ] Other (See Comments)    Elevated bp   Contrast Media [Iodinated Contrast Media] Itching and Rash    Delayed reaction post abdominal aortagram.    Morphine  Nausea Only and Other (See Comments)    Irritability    CURRENT HOME MEDICATIONS: No current facility-administered medications for this encounter.    albuterol  (PROVENTIL ) (2.5 MG/3ML) 0.083% nebulizer solution   albuterol  (VENTOLIN  HFA) 108 (90 Base) MCG/ACT inhaler   amoxicillin  (AMOXIL ) 500 MG  capsule   aspirin  EC 81 MG EC tablet   atorvastatin  (LIPITOR) 20 MG tablet   carvedilol  (COREG ) 3.125 MG tablet   cyclobenzaprine  (FLEXERIL ) 10 MG tablet   docusate sodium  (COLACE) 100 MG capsule   Dupilumab (DUPIXENT) 300 MG/2ML SOAJ   ezetimibe  (ZETIA ) 10 MG tablet   famotidine  (PEPCID ) 20 MG tablet   fluticasone  (FLONASE ) 50 MCG/ACT nasal spray   Fluticasone -Umeclidin-Vilant (TRELEGY ELLIPTA ) 200-62.5-25 MCG/ACT AEPB   furosemide  (LASIX ) 40 MG tablet   gabapentin  (NEURONTIN ) 600 MG tablet   hydrocortisone  2.5 % cream   isosorbide  mononitrate (IMDUR ) 30 MG 24 hr tablet   losartan  (COZAAR ) 25 MG tablet   montelukast  (SINGULAIR ) 10 MG tablet   mupirocin  ointment (BACTROBAN ) 2 %   nitroGLYCERIN  (NITROSTAT ) 0.4 MG SL tablet   ondansetron  (ZOFRAN ) 4 MG tablet   polyethylene glycol (MIRALAX  / GLYCOLAX ) 17 g packet   ranolazine  (RANEXA ) 500 MG 12 hr tablet   spironolactone  (ALDACTONE ) 25 MG tablet   tamsulosin  (FLOMAX ) 0.4 MG CAPS capsule   Cholecalciferol (VITAMIN D3) 25 MCG (1000 UT) CAPS   Cyanocobalamin  (VITAMIN B-12 ER PO)   nitrofurantoin , macrocrystal-monohydrate, (MACROBID ) 100 MG capsule   HISTORY: Past Medical History:  Diagnosis Date   Actinic keratosis    Anginal pain (HCC)    Anxiety    Aortic ectasia, abdominal (HCC)    a.) CT abd 01/01/2022: 2.9 cm infrarenal abdominal aorta   Aortic stenosis    Bladder cancer (HCC) 2023   BPH (benign prostatic hyperplasia) 09/06/2007   CAD s/p CABG    a.) 1990 s/p MI--> CABG x 3; b.) LHC/PCI 2002 --> BMS (unk  type) to LCx   Carotid arterial disease (HCC)    a.) 08/2016 Carotid U/S: <39% bilat; b.) carotid doppler 10/12/2022: mild-to-moderate (<50%) bilateral bifurcation plaque.   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 06/2019 Echo (in setting of COVID): EF 35-40%, mild LVH, g1 DD, glob HK; b. 09/2019 Echo: EF 50-55%, Gr1 DD; c. 03/2021 Echo: EF 50-55%, no rwma, mild LVH, GrI DD, nl RV fxn. RVSP 44.62mmHg. Mild-mod dil LA. Triv MR. Mild-mod Ao sclerosis.   History of 2019 novel coronavirus disease (COVID-19) 05/20/2019   History of hiatal hernia    History of shingles    HLD (hyperlipidemia) 04/26/2007   HTN (hypertension) 04/30/2007   LBBB (left bundle branch block)    Long-term use of aspirin  therapy    Lumbar disc disease with radiculopathy    Migraines    Myocardial infarction (HCC) 1990   a.) resulted in 3v CABG   Myocardial infarction (HCC) 2002   a.) second cardiac event; PCI with placement of BMS (unknown type) to LCx   Neuropathy of both feet    OSA on CPAP    Osteoarthritis    PAD (peripheral artery disease) (HCC)    a. 07/2017 LE duplex: RSFA 75-33m, LSFA 75-36m, 50-74d; c. 08/2018 Periph Angio: No signif AoIliac dzs. Mod-sev Ca2+ RSFA w/ diff dzs throughout- 3 vessel runoff. Borderline signif LSFA dzs w/ mod-sev Ca2+ vessels and 3 vessel runoff below the knee-->Med rx.   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   Right mid dorsum forearm - in situ - EDC   T2DM (type 2 diabetes mellitus) (HCC)    Traumatic open  wound of lower leg with delayed healing, left 12/28/2022   Past Surgical History:  Procedure Laterality Date   ABDOMINAL AORTOGRAM W/LOWER EXTREMITY [name] 09/12/2018   Procedure: ABDOMINAL AORTOGRAM W/LOWER EXTREMITY;  Surgeon: Wenona Hamilton, MD;  Location: [facility];  Service: Cardiovascular;  Laterality: [name];   ANTERIOR CERVICAL DECOMP/DISCECTOMY FUSION [name] 07/11/2022   Anterior Cervical Decompression Fusion ,Interboy Prothesis,Plate/Screws , Cervical four-five,Cervical five-six Larrie Po, Susana Enter, MD)   APPENDECTOMY     rupture   BACK SURGERY     1984 and then another one [facility]   BLADDER INSTILLATION [name] 10/19/2021   Procedure: BLADDER INSTILLATION OF GEMCITABINE ;  Surgeon: Geraline Knapp, MD;  Location: [facility] ORS;  Service: Urology;  Laterality: [name];   BLADDER INSTILLATION [name] 11/15/2022   Procedure: BLADDER INSTILLATION OF GEMCITABINE ;  Surgeon: Geraline Knapp, MD;  Location: [facility] ORS;  Service: Urology;  Laterality: [name];   CARDIAC CATHETERIZATION [name] 07/07/2015   Procedure: Left Heart Cath and Cors/Grafts Angiography;  Surgeon: Devorah Fonder, MD;  Location: [facility];  Service: Cardiovascular;  Laterality: [name];   CATARACT EXTRACTION, BILATERAL  2009   CERVICAL SPINE SURGERY  12/2016   cervical stenosis Larrie Po)   COLONOSCOPY  03/2010   HP polyp, diverticulosis, rpt 10 yrs ([name])   CORONARY ANGIOPLASTY  11/30/2000   LCX   CORONARY ARTERY BYPASS GRAFT  1990   3 vessel    CORONARY STENT INTERVENTION [name] 01/13/2023   Procedure: CORONARY STENT INTERVENTION;  Surgeon: Wenona Hamilton, MD;  Location: [facility];  Service: Cardiovascular;  Laterality: [name];   CYSTOSCOPY  12/23/2010   [facility]   JOINT REPLACEMENT     KNEE ARTHROSCOPY Right    x2   LAMINOTOMY  1986   L5/S1 lumbar laminotomy for two ruptured discs/fusion   LUMBAR LAMINECTOMY/DECOMPRESSION MICRODISCECTOMY [name] 07/13/2016   Procedure: LUMBAR TWO-THREE, LUMBAR THREE-FOUR, LUMBAR  FOUR-FIVE LAMINECTOMY AND FORAMINOTOMY;  Surgeon: Garry Kansas, MD;  Location: [facility];  Service: Neurosurgery;  Laterality: [name];  LAMINECTOMY AND FORAMINOTOMY L2-L3, L3-L4,L4-L5   LUMBAR SPINE SURGERY  06/2020   Bilateral redo laminectomy/laminotomy/foraminotomies/medial facetectomy to decompress the bilateral L4 and L5 nerve roots Larrie Po)   REVERSE SHOULDER ARTHROPLASTY Right 12/21/2023   Procedure: ARTHROPLASTY, SHOULDER, TOTAL, REVERSE;  Surgeon: Ellard Gunning, MD;  Location: [facility];  Service: Orthopedics;  Laterality: Right;   RIGHT/LEFT HEART CATH AND CORONARY ANGIOGRAPHY Bilateral 01/13/2023   Procedure: RIGHT/LEFT HEART CATH AND CORONARY ANGIOGRAPHY;  Surgeon: Wenona Hamilton, MD;  Location: [facility];  Service: Cardiovascular;  Laterality: Bilateral;   TOTAL HIP ARTHROPLASTY Bilateral 1999,2000   TOTAL KNEE ARTHROPLASTY Right 03/06/2017   Procedure: RIGHT TOTAL KNEE ARTHROPLASTY;  Surgeon: Liliane Rei, MD;  Location: [facility];  Service: Orthopedics;  Laterality: Right;   TRANSURETHRAL RESECTION OF BLADDER TUMOR [name] 10/19/2021   Procedure: TRANSURETHRAL RESECTION OF BLADDER TUMOR (TURBT);  Surgeon: Geraline Knapp, MD;  Location: [facility];  Service: Urology;  Laterality: [name];   TRANSURETHRAL RESECTION OF BLADDER TUMOR [name] 11/15/2022   Procedure: TRANSURETHRAL RESECTION OF BLADDER TUMOR (TURBT);  Surgeon: Geraline Knapp, MD;  Location: [facility];  Service: Urology;  Laterality: [name];   Family History  Problem Relation Age of Onset   Stroke Mother    Heart attack Mother    Diabetes Mother    Stroke Father    Heart disease Father    Polycystic kidney disease Father    Cancer Sister        throat   Polycystic kidney disease Sister    Diabetes Brother        1/2 brother   Cancer Other        5/7 nephews with lung cancer   Social History   Tobacco Use   Smoking status: Former    Current packs/day: 0.00    Average packs/day: 1 pack/day for 25.0 years (25.0 ttl  pk-yrs)    Types: Cigarettes    Start date: 08/16/1963    Quit date: 08/15/1988    Years since quitting: 35.4    Passive exposure: Past   Smokeless tobacco: Never  Substance Use Topics   Alcohol use: No    Alcohol/week: 0.0 standard drinks of alcohol   LABS:  Office Visit on 01/10/2024  Component Date Value Ref Range Status   Sodium 01/10/2024 139  135 - 145 mEq/L Final  Potassium 01/10/2024 4.6  3.5 - 5.1 mEq/L Final   Chloride 01/10/2024 103  96 - 112 mEq/L Final   CO2 01/10/2024 28  19 - 32 mEq/L Final   Albumin  01/10/2024 4.1  3.5 - 5.2 g/dL Final   BUN 16/05/9603 28 (H)  6 - 23 mg/dL Final   Creatinine, Ser 01/10/2024 1.55 (H)  0.40 - 1.50 mg/dL Final   Glucose, Bld 54/04/8118 106 (H)  70 - 99 mg/dL Final   Phosphorus 14/78/2956 4.1  2.3 - 4.6 mg/dL Final   GFR 21/30/8657 41.35 (L)  >60.00 mL/min Final   Calculated using the CKD-EPI Creatinine Equation (2021)   Calcium  01/10/2024 9.1  8.4 - 10.5 mg/dL Final  Appointment on 84/69/6295  Component Date Value Ref Range Status   Urine Culture, Comprehensive 01/05/2024 Final report   Final   Organism ID, Bacteria 01/05/2024 Comment   Final   No growth in 36 - 48 hours.   Specific Gravity, UA 01/05/2024 <1.005 (L)  1.005 - 1.030 Final   pH, UA 01/05/2024 5.5  5.0 - 7.5 Final   Color, UA 01/05/2024 Yellow  Yellow Final   Appearance Ur 01/05/2024 Clear  Clear Final   Leukocytes,UA 01/05/2024 Negative  Negative Final   Protein,UA 01/05/2024 Negative  Negative/Trace Final   Glucose, UA 01/05/2024 Negative  Negative Final   Ketones, UA 01/05/2024 Negative  Negative Final   RBC, UA 01/05/2024 Negative  Negative Final   Bilirubin, UA 01/05/2024 Negative  Negative Final   Urobilinogen, Ur 01/05/2024 0.2  0.2 - 1.0 mg/dL Final   Nitrite, UA 28/41/3244 Negative  Negative Final   Microscopic Examination 01/05/2024 Comment   Final   Microscopic follows if indicated.   Microscopic Examination 01/05/2024 See below:   Final   Microscopic  was indicated and was performed.   WBC, UA 01/05/2024 0-5  0 - 5 /hpf Final   RBC, Urine 01/05/2024 0-2  0 - 2 /hpf Final   Epithelial Cells (non renal) 01/05/2024 0-10  0 - 10 /hpf Final   Bacteria, UA 01/05/2024 Few  None seen/Few Final  Office Visit on 12/28/2023  Component Date Value Ref Range Status   WBC 12/28/2023 6.6  4.0 - 10.5 K/uL Final   RBC 12/28/2023 3.65 (L)  4.22 - 5.81 Mil/uL Final   Hemoglobin 12/28/2023 11.8 (L)  13.0 - 17.0 g/dL Final   HCT 08/17/7251 34.7 (L)  39.0 - 52.0 % Final   MCV 12/28/2023 95.1  78.0 - 100.0 fl Final   MCHC 12/28/2023 33.9  30.0 - 36.0 g/dL Final   RDW 66/44/0347 13.4  11.5 - 15.5 % Final   Platelets 12/28/2023 264.0  150.0 - 400.0 K/uL Final   Neutrophils Relative % 12/28/2023 58.7  43.0 - 77.0 % Final   Lymphocytes Relative 12/28/2023 17.7  12.0 - 46.0 % Final   Monocytes Relative 12/28/2023 17.6 (H)  3.0 - 12.0 % Final   Eosinophils Relative 12/28/2023 5.0  0.0 - 5.0 % Final   Basophils Relative 12/28/2023 1.0  0.0 - 3.0 % Final   Neutro Abs 12/28/2023 3.9  1.4 - 7.7 K/uL Final   Lymphs Abs 12/28/2023 1.2  0.7 - 4.0 K/uL Final   Monocytes Absolute 12/28/2023 1.2 (H)  0.1 - 1.0 K/uL Final   Eosinophils Absolute 12/28/2023 0.3  0.0 - 0.7 K/uL Final   Basophils Absolute 12/28/2023 0.1  0.0 - 0.1 K/uL Final   Sodium 12/28/2023 135  135 - 145 mEq/L Final   Potassium 12/28/2023  4.6  3.5 - 5.1 mEq/L Final   Chloride 12/28/2023 98  96 - 112 mEq/L Final   CO2 12/28/2023 27  19 - 32 mEq/L Final   Glucose, Bld 12/28/2023 126 (H)  70 - 99 mg/dL Final   BUN 54/04/8118 44 (H)  6 - 23 mg/dL Final   Creatinine, Ser 12/28/2023 1.79 (H)  0.40 - 1.50 mg/dL Final   Total Bilirubin 12/28/2023 0.6  0.2 - 1.2 mg/dL Final   Alkaline Phosphatase 12/28/2023 35 (L)  39 - 117 U/L Final   AST 12/28/2023 29  0 - 37 U/L Final   ALT 12/28/2023 10  0 - 53 U/L Final   Total Protein 12/28/2023 6.6  6.0 - 8.3 g/dL Final   Albumin  12/28/2023 3.7  3.5 - 5.2 g/dL Final    GFR 14/78/2956 34.80 (L)  >60.00 mL/min Final   Calculated using the CKD-EPI Creatinine Equation (2021)   Calcium  12/28/2023 8.8  8.4 - 10.5 mg/dL Final   Pro B Natriuretic peptide (BNP) 12/28/2023 329.0 (H)  0.0 - 100.0 pg/mL Final   Color, UA 12/28/2023 yellow   Final   Clarity, UA 12/28/2023 clear   Final   Glucose, UA 12/28/2023 Negative  Negative Final   Bilirubin, UA 12/28/2023 neg   Final   Ketones, UA 12/28/2023 neg   Final   Spec Grav, UA 12/28/2023 1.015  1.010 - 1.025 Final   Blood, UA 12/28/2023 neg   Final   pH, UA 12/28/2023 5.5  5.0 - 8.0 Final   Protein, UA 12/28/2023 Negative  Negative Final   Urobilinogen, UA 12/28/2023 0.2  0.2 or 1.0 E.U./dL Final   Nitrite, UA 21/30/8657 neg   Final   Leukocytes, UA 12/28/2023 Negative  Negative Final  Office Visit on 12/20/2023  Component Date Value Ref Range Status   Nitric Oxide  12/20/2023 32   Final    ECG: Date: 11/16/2023  Time ECG obtained: 1128 AM Rate: 61 bpm Rhythm: normal sinus; ILBBB Axis (leads I and aVF): left Intervals: PR 140 ms. QRS 116 ms. QTc 414 ms. ST segment and T wave changes: Lateral ST/T wave abnormalities Evidence of a possible, age undetermined, prior infarct:  No Comparison: Similar to previous tracing obtained on 10/16/2023   IMAGING / PROCEDURES: LONG TERM CARDIAC EVENT MONITOR STUDY performed on 11/07/2023 Patch Wear Time:  6 days and 18 hours (2025-02-28T16:08:36-0500 to 2025-03-07T11:02:09-498)   Normal sinus rhythm Patient had a min HR of 44 bpm, max HR of 131 bpm, and avg HR of 73 bpm.  Bundle Branch Block/IVCD was present.  Isolated SVEs were rare (<1.0%), SVE Triplets were rare (<1.0%), and no SVE Couplets were present.  Isolated VEs were rare (<1.0%, 3201), VE Couplets were rare (<1.0%, 45), and VE Triplets were rare (<1.0%, 2). Ventricular Trigeminy was present. No patient triggered events recorded  TRANSTHORACIC ECHOCARDIOGRAM performed on 09/05/2023 Left ventricular ejection  fraction, by estimation, is 40 to 45%. The left ventricle has mildly decreased function. The left ventricle demonstrates global hypokinesis. Left ventricular diastolic parameters are  consistent with Grade I diastolic dysfunction (impaired relaxation). The global longitudinal strain is abnormal.  Right ventricular systolic function is normal. The right ventricular size is normal.  Left atrial size was mildly dilated.  The mitral valve is normal in structure. Mild mitral valve regurgitation. No evidence of mitral stenosis.  The aortic valve is normal in structure. Aortic valve regurgitation is not visualized. Aortic valve sclerosis is present, with no evidence of aortic valve stenosis.  The inferior vena cava is normal in size with greater than 50% respiratory variability, suggesting right atrial pressure of 3 mmHg.   RIGHT/LEFT HEART CATHETERIZATION AND CORONARY ANGIOGRAPHY performed on 01/13/2023 Left dominant coronary arteries with severe three-vessel coronary artery disease.  Patent grafts including LIMA to LAD and SVG to first and second diagonals.  Patent mid left circumflex stent.  However, there is subtotal occlusion of the mid to distal left circumflex with collaterals from the LAD. Ost LM to LM lesion is 50% stenosed. Ost LAD to Prox LAD lesion is 100% stenosed. Mid LAD lesion is 60% stenosed. 2nd Diag lesion is 50% stenosed. Prox RCA-1 lesion is 99% stenosed. Prox RCA-2 lesion is 90% stenosed. Prox Cx to Mid Cx lesion is 5% stenosed. Ost Cx to Prox Cx lesion is 40% stenosed. Mid Cx to Dist Cx lesion is 99% stenosed. Dist Cx lesion is 50% stenosed. Dist LAD lesion is 40% stenosed. A drug-eluting stent was successfully placed using a STENT ONYX FRONTIER [id number].Post intervention, there is a 10% residual stenosis. 2.  Moderately reduced LV systolic function with an EF of 35-45% 3.  Right heart catheterization showed mildly elevated RA pressure, moderate pulmonary hypertension, moderately  to severely elevated wedge pressure and normal cardiac output Mean RA = 10 mmHg Mean PA = 40 mmHg Mean PCWP = 28 mmHg RVEDP = 13 mmHg LVEDP = 30 mmHg QP/QS = 1 CO = 8.81 L/min CI = 4.08 L/min/m2 4.  Difficult but successful angioplasty and drug-eluting stent placement to the mid/distal left circumflex.  The procedure was difficult due to calcifications and subtotal occlusion with difficulty crossing.     MYOCARDIAL PERFUSION IMAGING STUDY (LEXISCAN ) performed on 12/27/2022 Left ventricular systolic function is severely reduced (LVEF 25-35%). Post-CABG findings, coronary artery calcification, and aortic atherosclerosis are noted on the attenuation correction CT. There is a moderate in size, severe, partially reversible basal and mid inferior/inferolateral defect consistent with ischemia and an element of scar.   1.4 cm hypodensity is noted in the liver, similar to the prior abdominal CT from 10/04/2021. Compared with the prior study from 04/22/2021, inferior/inferolateral defect and reduced LVEF are new. Abnormal pharmacologic myocardial perfusion stress test.  High risk study.   US  CAROTID BILATERAL performed on 10/12/2022 Right carotid: Mild-to-moderate calcified plaque seen in the distal common and proximal internal carotid arteries. Right vertebral: Antegrade flow. Left carotid:  Moderate long segment calcified plaque of the mid common carotid artery. Mild long segment calcified plaque of the distal common carotid artery extending into the proximal internal carotid artery. Left vertebral:  Antegrade flow. Overall impression: Mild-to-moderate bilateral carotid bifurcation plaque with Doppler measurements indicative of less than 50% stenosis.  PULMONARY FUNCTION TESTING performed on 03/24/2021     IMPRESSION AND PLAN: [name] has been referred for pre-anesthesia review and clearance prior to him undergoing the planned anesthetic and procedural courses. Available labs,  pertinent testing, and imaging results were personally reviewed by me in preparation for upcoming operative/procedural course. [facility] medical record has been updated following extensive record review and patient interview with PAT staff.   This patient has been appropriately cleared by cardiology with an overall MODERATE risk of patient experiencing significant perioperative cardiovascular complications. Based on clinical review performed today (01/12/24), barring any significant acute changes in the patient's overall condition, it is anticipated that he will be able to proceed with the planned surgical intervention. Any acute changes in clinical condition may necessitate his procedure being postponed and/or cancelled. Patient will meet  with anesthesia team (MD and/or CRNA) on the day of his procedure for preoperative evaluation/assessment. Questions regarding anesthetic course will be fielded at that time.   Pre-surgical instructions were reviewed with the patient during his PAT appointment, and questions were fielded to satisfaction by PAT clinical staff. He has been instructed on which medications that he will need to hold prior to surgery, as well as the ones that have been deemed safe/appropriate to take on the day of his procedure. As part of the general education provided by PAT, patient made aware both verbally and in writing, that he would need to abstain from the use of any illegal substances during his perioperative course. He was advised that failure to follow the provided instructions could necessitate case cancellation or result in serious perioperative complications up to and including death. Patient encouraged to contact PAT and/or his surgeon's office to discuss any questions or concerns that may arise prior to surgery; verbalized understanding.   Renate Caroline, MSN, APRN, FNP-C, CEN [facility]  Perioperative Services Nurse Practitioner Phone: [phone] Fax: [phone] 01/12/24 3:48 PM  NOTE: This note has been prepared using Dragon dictation software. Despite my best ability to proofread, there is always the potential that unintentional transcriptional errors may still occur from this process.

## 2024-01-14 ENCOUNTER — Encounter: Payer: Self-pay | Admitting: Urology

## 2024-01-16 ENCOUNTER — Encounter: Payer: Self-pay | Admitting: Urology

## 2024-01-16 ENCOUNTER — Ambulatory Visit: Payer: Self-pay | Admitting: Urgent Care

## 2024-01-16 ENCOUNTER — Other Ambulatory Visit: Payer: Self-pay

## 2024-01-16 ENCOUNTER — Encounter: Admission: RE | Payer: Self-pay | Source: Home / Self Care

## 2024-01-16 ENCOUNTER — Ambulatory Visit
Admission: RE | Admit: 2024-01-16 | Discharge: 2024-01-16 | Disposition: A | Discharge status: Home / Self Care | Attending: Urology | Admitting: Urology

## 2024-01-16 DIAGNOSIS — J439 Emphysema, unspecified: Secondary | ICD-10-CM | POA: Diagnosis not present

## 2024-01-16 DIAGNOSIS — Z981 Arthrodesis status: Secondary | ICD-10-CM | POA: Diagnosis not present

## 2024-01-16 DIAGNOSIS — D090 Carcinoma in situ of bladder: Secondary | ICD-10-CM | POA: Insufficient documentation

## 2024-01-16 DIAGNOSIS — I2510 Atherosclerotic heart disease of native coronary artery without angina pectoris: Secondary | ICD-10-CM | POA: Insufficient documentation

## 2024-01-16 DIAGNOSIS — Z87891 Personal history of nicotine dependence: Secondary | ICD-10-CM | POA: Insufficient documentation

## 2024-01-16 DIAGNOSIS — E785 Hyperlipidemia, unspecified: Secondary | ICD-10-CM | POA: Insufficient documentation

## 2024-01-16 DIAGNOSIS — I25110 Atherosclerotic heart disease of native coronary artery with unstable angina pectoris: Secondary | ICD-10-CM | POA: Diagnosis not present

## 2024-01-16 DIAGNOSIS — Z955 Presence of coronary angioplasty implant and graft: Secondary | ICD-10-CM | POA: Diagnosis not present

## 2024-01-16 DIAGNOSIS — Z8673 Personal history of transient ischemic attack (TIA), and cerebral infarction without residual deficits: Secondary | ICD-10-CM | POA: Diagnosis not present

## 2024-01-16 DIAGNOSIS — I110 Hypertensive heart disease with heart failure: Secondary | ICD-10-CM | POA: Diagnosis not present

## 2024-01-16 DIAGNOSIS — I252 Old myocardial infarction: Secondary | ICD-10-CM | POA: Insufficient documentation

## 2024-01-16 DIAGNOSIS — I5022 Chronic systolic (congestive) heart failure: Secondary | ICD-10-CM | POA: Diagnosis not present

## 2024-01-16 DIAGNOSIS — D494 Neoplasm of unspecified behavior of bladder: Secondary | ICD-10-CM

## 2024-01-16 DIAGNOSIS — Z951 Presence of aortocoronary bypass graft: Secondary | ICD-10-CM | POA: Diagnosis not present

## 2024-01-16 DIAGNOSIS — G4733 Obstructive sleep apnea (adult) (pediatric): Secondary | ICD-10-CM | POA: Diagnosis not present

## 2024-01-16 DIAGNOSIS — N183 Chronic kidney disease, stage 3 unspecified: Secondary | ICD-10-CM | POA: Diagnosis not present

## 2024-01-16 DIAGNOSIS — I130 Hypertensive heart and chronic kidney disease with heart failure and stage 1 through stage 4 chronic kidney disease, or unspecified chronic kidney disease: Secondary | ICD-10-CM | POA: Diagnosis not present

## 2024-01-16 DIAGNOSIS — K219 Gastro-esophageal reflux disease without esophagitis: Secondary | ICD-10-CM | POA: Insufficient documentation

## 2024-01-16 DIAGNOSIS — I255 Ischemic cardiomyopathy: Secondary | ICD-10-CM | POA: Diagnosis not present

## 2024-01-16 DIAGNOSIS — N35912 Unspecified bulbous urethral stricture, male: Secondary | ICD-10-CM | POA: Insufficient documentation

## 2024-01-16 DIAGNOSIS — E1140 Type 2 diabetes mellitus with diabetic neuropathy, unspecified: Secondary | ICD-10-CM | POA: Insufficient documentation

## 2024-01-16 DIAGNOSIS — E1122 Type 2 diabetes mellitus with diabetic chronic kidney disease: Secondary | ICD-10-CM | POA: Diagnosis not present

## 2024-01-16 DIAGNOSIS — Z86718 Personal history of other venous thrombosis and embolism: Secondary | ICD-10-CM | POA: Diagnosis not present

## 2024-01-16 DIAGNOSIS — I3481 Nonrheumatic mitral (valve) annulus calcification: Secondary | ICD-10-CM | POA: Diagnosis not present

## 2024-01-16 DIAGNOSIS — Z79899 Other long term (current) drug therapy: Secondary | ICD-10-CM | POA: Diagnosis not present

## 2024-01-16 DIAGNOSIS — I5030 Unspecified diastolic (congestive) heart failure: Secondary | ICD-10-CM | POA: Diagnosis not present

## 2024-01-16 HISTORY — DX: Anxiety disorder, unspecified: F41.9

## 2024-01-16 HISTORY — DX: Palpitations: R00.2

## 2024-01-16 HISTORY — PX: CYSTOSCOPY WITH BIOPSY: SHX5122

## 2024-01-16 HISTORY — DX: Other cervical disc degeneration, unspecified cervical region: M50.30

## 2024-01-16 HISTORY — DX: Long term (current) use of aspirin: Z79.82

## 2024-01-16 HISTORY — DX: Nonrheumatic aortic (valve) stenosis: I35.0

## 2024-01-16 HISTORY — DX: Pulmonary hypertension, unspecified: I27.20

## 2024-01-16 HISTORY — DX: Central retinal artery occlusion, left eye: H34.12

## 2024-01-16 HISTORY — DX: Unspecified systolic (congestive) heart failure: I50.20

## 2024-01-16 HISTORY — DX: Type 2 diabetes mellitus without complications: E11.9

## 2024-01-16 HISTORY — DX: Failed or difficult intubation, initial encounter: T88.4XXA

## 2024-01-16 LAB — GLUCOSE, CAPILLARY: Glucose-Capillary: 124 mg/dL — ABNORMAL HIGH (ref 70–99)

## 2024-01-16 SURGERY — CYSTOSCOPY, WITH BIOPSY
Anesthesia: General | Site: Bladder

## 2024-01-16 MED ORDER — STERILE WATER FOR IRRIGATION IR SOLN
Status: DC | PRN
  Administered 2024-01-16 (×2): 3000 mL

## 2024-01-16 MED ORDER — ORAL CARE MOUTH RINSE
15.0000 mL | Freq: Once | OROMUCOSAL | Status: AC
Start: 2024-01-16 — End: 2024-01-16

## 2024-01-16 MED ORDER — CEFAZOLIN SODIUM-DEXTROSE 2-4 GM/100ML-% IV SOLN
2.0000 g | INTRAVENOUS | Status: AC
  Administered 2024-01-16: 2 g via INTRAVENOUS

## 2024-01-16 MED ORDER — CHLORHEXIDINE GLUCONATE 0.12 % MT SOLN
15.0000 mL | Freq: Once | OROMUCOSAL | Status: AC
Start: 2024-01-16 — End: 2024-01-16
  Administered 2024-01-16: 15 mL via OROMUCOSAL

## 2024-01-16 MED ORDER — LIDOCAINE HCL (CARDIAC) PF 100 MG/5ML IV SOSY
PREFILLED_SYRINGE | INTRAVENOUS | Status: DC | PRN
  Administered 2024-01-16: 100 mg via INTRAVENOUS

## 2024-01-16 MED ORDER — LACTATED RINGERS IV SOLN: INTRAVENOUS | Status: DC

## 2024-01-16 MED ORDER — FENTANYL CITRATE (PF) 100 MCG/2ML IJ SOLN
INTRAMUSCULAR | Status: AC
  Filled 2024-01-16: qty 2

## 2024-01-16 MED ORDER — STERILE WATER FOR IRRIGATION IR SOLN
Status: DC | PRN
  Administered 2024-01-16: 500 mL

## 2024-01-16 MED ORDER — ACETAMINOPHEN 10 MG/ML IV SOLN: 1000.0000 mg | Freq: Once | INTRAVENOUS | Status: DC | PRN

## 2024-01-16 MED ORDER — DROPERIDOL 2.5 MG/ML IJ SOLN: 0.6250 mg | Freq: Once | INTRAMUSCULAR | Status: DC | PRN

## 2024-01-16 MED ORDER — ACETAMINOPHEN 10 MG/ML IV SOLN
INTRAVENOUS | Status: AC
  Filled 2024-01-16: qty 100

## 2024-01-16 MED ORDER — CEFAZOLIN SODIUM-DEXTROSE 2-4 GM/100ML-% IV SOLN
INTRAVENOUS | Status: AC
  Filled 2024-01-16: qty 100

## 2024-01-16 MED ORDER — ONDANSETRON HCL 4 MG/2ML IJ SOLN
INTRAMUSCULAR | Status: DC | PRN
  Administered 2024-01-16: 4 mg via INTRAVENOUS

## 2024-01-16 MED ORDER — EPHEDRINE SULFATE-NACL 50-0.9 MG/10ML-% IV SOSY
PREFILLED_SYRINGE | INTRAVENOUS | Status: DC | PRN
  Administered 2024-01-16: 5 mg via INTRAVENOUS
  Administered 2024-01-16: 7.5 mg via INTRAVENOUS

## 2024-01-16 MED ORDER — PROPOFOL 10 MG/ML IV BOLUS
INTRAVENOUS | Status: DC | PRN
  Administered 2024-01-16: 100 mg via INTRAVENOUS

## 2024-01-16 MED ORDER — PHENYLEPHRINE 80 MCG/ML (10ML) SYRINGE FOR IV PUSH (FOR BLOOD PRESSURE SUPPORT)
PREFILLED_SYRINGE | INTRAVENOUS | Status: DC | PRN
  Administered 2024-01-16 (×3): 80 ug via INTRAVENOUS

## 2024-01-16 MED ORDER — ACETAMINOPHEN 10 MG/ML IV SOLN
INTRAVENOUS | Status: DC | PRN
  Administered 2024-01-16: 1000 mg via INTRAVENOUS

## 2024-01-16 MED ORDER — CHLORHEXIDINE GLUCONATE 0.12 % MT SOLN
OROMUCOSAL | Status: AC
  Filled 2024-01-16: qty 15

## 2024-01-16 MED ORDER — DEXAMETHASONE SODIUM PHOSPHATE 10 MG/ML IJ SOLN
INTRAMUSCULAR | Status: DC | PRN
  Administered 2024-01-16: 10 mg via INTRAVENOUS

## 2024-01-16 MED ORDER — GLYCOPYRROLATE 0.2 MG/ML IJ SOLN
INTRAMUSCULAR | Status: DC | PRN
  Administered 2024-01-16: .2 mg via INTRAVENOUS

## 2024-01-16 MED ORDER — FENTANYL CITRATE (PF) 100 MCG/2ML IJ SOLN
INTRAMUSCULAR | Status: DC | PRN
  Administered 2024-01-16: 50 ug via INTRAVENOUS

## 2024-01-16 SURGICAL SUPPLY — 17 items
ADAPTER IRRIG TUBE 2 SPIKE SOL (ADAPTER) IMPLANT
BAG DRAIN SIEMENS DORNER NS (MISCELLANEOUS) ×2 IMPLANT
BRUSH SCRUB EZ 1% IODOPHOR (MISCELLANEOUS) ×2 IMPLANT
BRUSH SCRUB EZ 4% CHG (MISCELLANEOUS) IMPLANT
DRSG TELFA 3X4 N-ADH STERILE (GAUZE/BANDAGES/DRESSINGS) ×2 IMPLANT
ELECTRODE LOOP 22F BIPOLAR SML (ELECTROSURGICAL) IMPLANT
ELECTRODE REM PT RTRN 9FT ADLT (ELECTROSURGICAL) ×2 IMPLANT
GLOVE BIOGEL PI IND STRL 7.5 (GLOVE) ×2 IMPLANT
GOWN STRL REUS W/ TWL LRG LVL3 (GOWN DISPOSABLE) ×4 IMPLANT
GOWN STRL REUS W/ TWL XL LVL3 (GOWN DISPOSABLE) ×2 IMPLANT
KIT TURNOVER CYSTO (KITS) ×2 IMPLANT
NDL SAFETY ECLIPSE 18X1.5 (NEEDLE) ×2 IMPLANT
PACK CYSTO AR (MISCELLANEOUS) ×2 IMPLANT
SET CYSTO W/LG BORE CLAMP LF (SET/KITS/TRAYS/PACK) ×2 IMPLANT
SURGILUBE 2OZ TUBE FLIPTOP (MISCELLANEOUS) ×2 IMPLANT
WATER STERILE IRR 3000ML UROMA (IV SOLUTION) ×2 IMPLANT
WATER STERILE IRR 500ML POUR (IV SOLUTION) ×2 IMPLANT

## 2024-01-16 NOTE — Anesthesia Procedure Notes (Signed)
 Procedure Name: LMA Insertion Date/Time: 01/16/2024 10:02 AM  Performed by: Ellwood Haber, CRNAPre-anesthesia Checklist: Patient identified, Patient being monitored, Timeout performed, Emergency Drugs available and Suction available Patient Re-evaluated:Patient Re-evaluated prior to induction Oxygen  Delivery Method: Circle system utilized Preoxygenation: Pre-oxygenation with 100% oxygen  Induction Type: IV induction Ventilation: Mask ventilation without difficulty LMA: LMA inserted LMA Size: 4.0 Tube type: Oral Number of attempts: 1 Placement Confirmation: positive ETCO2 and breath sounds checked- equal and bilateral Tube secured with: Tape Dental Injury: Teeth and Oropharynx as per pre-operative assessment  Comments: Smooth atraumatic LMA placement, no complications noted.

## 2024-01-16 NOTE — H&P (Signed)
 Urology H&P  Urologic history: 1.  Ta high-grade urothelial carcinoma bladder Incidentally noted on pelvic MRI ordered by orthopedics to have a 15 x 20 mm bladder mass near right UO; cystoscopy with 3 cm papillary tumor TURBT 10/19/2025 with high-grade tumor and a low-grade tumor left posterolateral wall Induction BCG x6 completed 01/20/2022 Maintenance BCG x 3: 05/23/2022 MR urogram 03/2022 showed no upper tract abnormalities TURBT 11/2022 for a recurrent papillary tumor on surveillance; path high-grade Ta urothelial carcinoma Reinduction BCG x 6 completed 02/15/2023 Maintenance BCG x 3 completed: 07/06/2023   2.  BPH with LUTS Cystoscopy moderate lateral lobe enlargement/bladder neck elevation On tamsulosin    History of Present Illness: [name] is a 83 y.o. with a history of recurrent high-grade urothelial carcinoma the bladder.  On recent surveillance cystoscopy noted to have 2 small papillary lesions mid posterior wall.  He presents today for biopsy/fulguration  Past Medical History:  Diagnosis Date   Actinic keratosis    Anginal pain (HCC)    Anxiety    Aortic ectasia, abdominal (HCC)    a.) CT abd 01/01/2022: 2.9 cm infrarenal abdominal aorta   Aortic stenosis    Bladder cancer (HCC) 2023   BPH (benign prostatic hyperplasia) 09/06/2007   CAD s/p CABG    a.) 1990 s/p MI--> CABG x 3; b.) LHC/PCI 2002 --> BMS (unk type) to LCx   Carotid arterial disease (HCC)    a.) 08/2016 Carotid U/S: <39% bilat; b.) carotid doppler 10/12/2022: mild-to-moderate (<50%) bilateral bifurcation plaque.   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 06/2019 Echo (in setting of COVID): EF 35-40%, mild LVH, g1 DD, glob HK; b. 09/2019 Echo: EF 50-55%, Gr1 DD; c. 03/2021 Echo: EF 50-55%, no rwma, mild LVH, GrI DD, nl RV fxn. RVSP 44.46mmHg. Mild-mod dil LA. Triv MR. Mild-mod Ao sclerosis.   History of 2019 novel coronavirus disease (COVID-19) 05/20/2019   History of hiatal hernia    History of shingles    HLD (hyperlipidemia) 04/26/2007   HTN (hypertension) 04/30/2007   LBBB (left bundle branch block)    Long-term use of aspirin  therapy    Lumbar disc disease with radiculopathy    Migraines    Myocardial infarction (HCC) 1990   a.) resulted in 3v CABG   Myocardial infarction (HCC) 2002   a.) second cardiac event; PCI with placement of BMS (unknown type) to LCx   Neuropathy of both feet    OSA on CPAP    Osteoarthritis    PAD (peripheral artery disease) (HCC)    a. 07/2017 LE duplex: RSFA 75-27m, LSFA 75-33m, 50-74d; c. 08/2018 Periph Angio: No signif AoIliac dzs. Mod-sev Ca2+ RSFA w/ diff dzs throughout- 3 vessel runoff. Borderline signif LSFA dzs w/ mod-sev Ca2+ vessels and 3 vessel runoff below the knee-->Med rx.   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   Right mid dorsum forearm - in situ - EDC   T2DM (type 2 diabetes mellitus) (HCC)    Traumatic open wound of lower leg with delayed healing, left 12/28/2022    Past Surgical History:  Procedure Laterality Date    ABDOMINAL AORTOGRAM W/LOWER EXTREMITY [name] 09/12/2018   Procedure: ABDOMINAL AORTOGRAM W/LOWER EXTREMITY;  Surgeon: Wenona Hamilton, MD;  Location: [facility];  Service: Cardiovascular;  Laterality: [name];   ANTERIOR CERVICAL DECOMP/DISCECTOMY FUSION [name] 07/11/2022   Anterior Cervical Decompression Fusion ,Interboy Prothesis,Plate/Screws , Cervical four-five,Cervical five-six Larrie Po, Susana Enter, MD)   APPENDECTOMY     rupture   BACK SURGERY     1984 and then another one [facility]   BLADDER INSTILLATION [name] 10/19/2021   Procedure: BLADDER INSTILLATION OF GEMCITABINE ;  Surgeon: Geraline Knapp, MD;  Location: [facility];  Service: Urology;  Laterality: [name];   BLADDER INSTILLATION [name] 11/15/2022   Procedure: BLADDER INSTILLATION OF GEMCITABINE ;  Surgeon: Geraline Knapp, MD;  Location: [facility];  Service: Urology;  Laterality: [name];   CARDIAC CATHETERIZATION [name] 07/07/2015   Procedure: Left Heart Cath and Cors/Grafts Angiography;  Surgeon: Devorah Fonder, MD;  Location: [facility];  Service: Cardiovascular;  Laterality: [name];   CATARACT EXTRACTION, BILATERAL  2009   CERVICAL SPINE SURGERY  12/2016   cervical stenosis Larrie Po)   COLONOSCOPY  03/2010   HP polyp, diverticulosis, rpt 10 yrs ([name])   CORONARY ANGIOPLASTY  11/30/2000   LCX   CORONARY ARTERY BYPASS GRAFT  1990   3 vessel    CORONARY STENT INTERVENTION [name] 01/13/2023   Procedure: CORONARY STENT INTERVENTION;  Surgeon: Wenona Hamilton, MD;  Location: [facility];  Service: Cardiovascular;  Laterality: [name];   CYSTOSCOPY  12/23/2010   [facility]   KNEE ARTHROSCOPY Right    x2   LAMINOTOMY  1986   L5/S1 lumbar laminotomy for two ruptured discs/fusion   LUMBAR LAMINECTOMY/DECOMPRESSION MICRODISCECTOMY [name] 07/13/2016   Procedure: LUMBAR TWO-THREE, LUMBAR THREE-FOUR, LUMBAR FOUR-FIVE LAMINECTOMY AND FORAMINOTOMY;  Surgeon: Garry Kansas, MD;  Location: [facility];  Service: Neurosurgery;  Laterality: [name];  LAMINECTOMY  AND FORAMINOTOMY L2-L3, L3-L4,L4-L5   LUMBAR SPINE SURGERY  06/2020   Bilateral redo laminectomy/laminotomy/foraminotomies/medial facetectomy to decompress the bilateral L4 and L5 nerve roots Larrie Po)   REVERSE SHOULDER ARTHROPLASTY Right 12/21/2023   Procedure: ARTHROPLASTY, SHOULDER, TOTAL, REVERSE;  Surgeon: Ellard Gunning, MD;  Location: [facility];  Service: Orthopedics;  Laterality: Right;   RIGHT/LEFT HEART CATH AND CORONARY ANGIOGRAPHY Bilateral 01/13/2023   Procedure: RIGHT/LEFT HEART CATH AND CORONARY ANGIOGRAPHY;  Surgeon: Wenona Hamilton, MD;  Location: [facility];  Service: Cardiovascular;  Laterality: Bilateral;   TOTAL HIP ARTHROPLASTY Bilateral 1999,2000   TOTAL KNEE ARTHROPLASTY Right 03/06/2017   Procedure: RIGHT TOTAL KNEE ARTHROPLASTY;  Surgeon: Liliane Rei, MD;  Location: [facility];  Service: Orthopedics;  Laterality: Right;   TRANSURETHRAL RESECTION OF BLADDER TUMOR [name] 10/19/2021   Procedure: TRANSURETHRAL RESECTION OF BLADDER TUMOR (TURBT);  Surgeon: Geraline Knapp, MD;  Location: [facility];  Service: Urology;  Laterality: [name];   TRANSURETHRAL RESECTION OF BLADDER TUMOR [name]  11/15/2022   Procedure: TRANSURETHRAL RESECTION OF BLADDER TUMOR (TURBT);  Surgeon: Geraline Knapp, MD;  Location: [facility];  Service: Urology;  Laterality: [name];    Home Medications:  Current Meds  Medication Sig   albuterol  (PROVENTIL ) (2.5 MG/3ML) 0.083% nebulizer solution TAKE 3 MLS BY NEBULIZATION EVERY 6 HOURSAS NEEDED FOR WHEEZING OR SHORTNESS OF BREATH   albuterol  (VENTOLIN  HFA) 108 (90 Base) MCG/ACT inhaler TAKE 2 PUFFS INTO LUNGS EVERY 6 HOURS ASNEEDED FOR WHEEZING OR SHORTNESS OF BREATH   amoxicillin  (AMOXIL ) 500 MG capsule TAKE 4 CAPSULES 30-60 MINUTES PRIOR TO PROCEDURE   aspirin  EC 81 MG EC tablet Take 1 tablet (81 mg total) by mouth daily.   atorvastatin  (LIPITOR) 20 MG tablet TAKE 1 TABLET BY MOUTH DAILY   carvedilol  (COREG ) 3.125 MG tablet TAKE 1 TABLET BY MOUTH TWICE  DAILY   Cholecalciferol (VITAMIN D3) 25 MCG (1000 UT) CAPS Take 1 capsule (1,000 Units total) by mouth 2 (two) times a week.   Cyanocobalamin  (VITAMIN B-12 ER PO) Take 1 capsule by mouth daily.   cyclobenzaprine  (FLEXERIL ) 10 MG tablet Take 0.5 tablets (5 mg total) by mouth 3 (three) times daily as needed for muscle spasms.   docusate sodium  (COLACE) 100 MG capsule Take 1 capsule (100 mg total) by mouth daily as needed for mild constipation.   Dupilumab (DUPIXENT) 300 MG/2ML SOAJ Inject 300 mg into the skin every 14 (fourteen) days. **patient completed loading dose in clinic**   ezetimibe  (ZETIA ) 10 MG tablet TAKE 1 TABLET BY MOUTH DAILY   famotidine  (PEPCID ) 20 MG tablet Take 20 mg by mouth daily.   fluticasone  (FLONASE ) 50 MCG/ACT nasal spray Place 1 spray into both nostrils daily as needed for allergies or rhinitis.   Fluticasone -Umeclidin-Vilant (TRELEGY ELLIPTA ) 200-62.5-25 MCG/ACT AEPB Inhale 1 puff into the lungs daily.   furosemide  (LASIX ) 40 MG tablet Take 0.5 tablets (20 mg total) by mouth daily as needed. Take 1 tablet (40 mg) daily as need for weight greater then 208.   gabapentin  (NEURONTIN ) 600 MG tablet Take 1 tablet (600 mg total) by mouth 3 (three) times daily.   hydrocortisone  2.5 % cream Apply topically 2 (two) times daily as needed (Rash).   isosorbide  mononitrate (IMDUR ) 30 MG 24 hr tablet TAKE 1 TABLET BY MOUTH DAILY.   losartan  (COZAAR ) 25 MG tablet Take 1 tablet (25 mg total) by mouth daily.   montelukast  (SINGULAIR ) 10 MG tablet TAKE 1 TABLET BY MOUTH DAILY   mupirocin  ointment (BACTROBAN ) 2 % Apply 1 Application topically 2 (two) times daily. (Patient taking differently: Apply 1 Application topically daily as needed (irritation).)   nitroGLYCERIN  (NITROSTAT ) 0.4 MG SL tablet Place 1 tablet (0.4 mg total) under the tongue every 5 (five) minutes as needed for chest pain.   ondansetron  (ZOFRAN ) 4 MG tablet Take 1 tablet (4 mg total) by mouth every 8 (eight) hours as needed  for nausea or vomiting.   polyethylene glycol (MIRALAX  / GLYCOLAX ) 17 g packet Take 17 g by mouth daily as needed for mild constipation.    ranolazine  (RANEXA ) 500 MG 12 hr tablet TAKE ONE (1) TABLET BY MOUTH TWO TIMES PER DAY   spironolactone  (ALDACTONE ) 25 MG tablet Take 0.5 tablets (12.5 mg total) by mouth daily.   tamsulosin  (FLOMAX ) 0.4 MG CAPS capsule TAKE 1 CAPSULE BY MOUTH AT BEDTIME   [DISCONTINUED] oxyCODONE -acetaminophen  (PERCOCET) 5-325 MG tablet Take 1 tablet by mouth every 4 (four) hours as needed (max 6 q).   [DISCONTINUED] ranolazine  (RANEXA )  500 MG 12 hr tablet TAKE ONE (1) TABLET BY MOUTH TWO TIMES PER DAY    Allergies:  Allergies  Allergen Reactions   Vioxx [Rofecoxib] Other (See Comments)    Hemorrhage    Oxycodone  Other (See Comments)    Could not wake up , felt drunk   Spiriva  Respimat [Tiotropium Bromide Monohydrate ] Other (See Comments)    Elevated bp   Contrast Media [Iodinated Contrast Media] Itching and Rash    Delayed reaction post abdominal aortagram.    Morphine  Nausea Only and Other (See Comments)    Irritability     Family History  Problem Relation Age of Onset   Stroke Mother    Heart attack Mother    Diabetes Mother    Stroke Father    Heart disease Father    Polycystic kidney disease Father    Cancer Sister        throat   Polycystic kidney disease Sister    Diabetes Brother        1/2 brother   Cancer Other        5/7 nephews with lung cancer    Social History:  reports that he quit smoking about 35 years ago. His smoking use included cigarettes. He started smoking about 60 years ago. He has a 25 pack-year smoking history. He has been exposed to tobacco smoke. He has never used smokeless tobacco. He reports that he does not drink alcohol and does not use drugs.  ROS: No fever, chills, shortness of breath, chest pain  Physical Exam:  Vital signs in last 24 hours: Temp:  [97.9 F (36.6 C)] 97.9 F (36.6 C) (06/03 0821) Pulse Rate:   [75] 75 (06/03 0821) Resp:  [16] 16 (06/03 0821) BP: (131)/(66) 131/66 (06/03 0821) SpO2:  [97 %] 97 % (06/03 0821) Constitutional:  Alert and oriented, No acute distress HEENT: [place] AT Respiratory: Normal respiratory effort, lungs clear bilaterally Psychiatric: Normal mood and affect  Assessment/Plan:  83 y.o. male with recurrent bladder tumor for cystoscopy with biopsy/fulguration.  The procedure has been discussed in detail including potential risk of bleeding, infection/sepsis and bladder injury.  All questions were answered and he desires to proceed   01/16/2024, 9:54 AM  Darlynn Elam,  MD

## 2024-01-16 NOTE — Discharge Instructions (Signed)
 Transurethral Resection of Bladder Tumor (TURBT) or Bladder Biopsy   Definition:  Transurethral Resection of the Bladder Tumor is a surgical procedure used to diagnose and remove tumors within the bladder.   General instructions:     Your recent bladder surgery requires very little post hospital care but some definite precautions.  Despite the fact that no skin incisions were used, the area around the bladder incisions are raw and covered with scabs to promote healing and prevent bleeding. Certain precautions are needed to insure that the scabs are not disturbed over the next 2-4 weeks while the healing proceeds.  Because the raw surface inside your bladder and the irritating effects of urine you may expect frequency of urination and/or urgency (a stronger desire to urinate) and perhaps even getting up at night more often. This will usually resolve or improve slowly over the healing period. You may see some blood in your urine over the first 6 weeks. Do not be alarmed, even if the urine was clear for a while. Get off your feet and drink lots of fluids until clearing occurs. If you start to pass clots or don't improve call us .  Diet:  You may return to your normal diet immediately. Because of the raw surface of your bladder, alcohol, spicy foods, foods high in acid and drinks with caffeine may cause irritation or frequency and should be used in moderation. To keep your urine flowing freely and avoid constipation, drink plenty of fluids during the day (8-10 glasses). Tip: Avoid cranberry juice because it is very acidic.  Activity:  Your physical activity doesn't need to be restricted. However, if you are very active, you may see some blood in the urine. We suggest that you reduce your activity under the circumstances until the bleeding has stopped.  Bowels:  It is important to keep your bowels regular during the postoperative period. Straining with bowel movements can cause bleeding. A bowel  movement every other day is reasonable. Use a mild laxative if needed, such as milk of magnesia 2-3 tablespoons, or 2 Dulcolax tablets. Call if you continue to have problems. If you had been taking narcotics for pain, before, during or after your surgery, you may be constipated. Take a laxative if necessary.    Medication:  You should resume your pre-surgery medications unless told not to.    [facility] [address] [place], [place] [address] [phone]

## 2024-01-16 NOTE — Transfer of Care (Signed)
 Immediate Anesthesia Transfer of Care Note  Patient: [name]  Procedure(s) Performed: CYSTOSCOPY, WITH BIOPSY (Bladder)  Patient Location: PACU  Anesthesia Type:General  Level of Consciousness: drowsy  Airway & Oxygen  Therapy: Patient Spontanous Breathing and Patient connected to face mask oxygen   Post-op Assessment: Report given to RN and Post -op Vital signs reviewed and stable  Post vital signs: Reviewed and stable  Last Vitals:  Vitals Value Taken Time  BP 141/61 01/16/24 1049  Temp 35.8 1049  Pulse 79 01/16/24 1051  Resp 17 01/16/24 1051  SpO2 100 % 01/16/24 1051  Vitals shown include unfiled device data.  Last Pain:  Vitals:   01/16/24 0821  TempSrc: Temporal  PainSc: 0-No pain         Complications: No notable events documented.

## 2024-01-16 NOTE — Interval H&P Note (Signed)
 History and Physical Interval Note:  01/16/2024 9:58 AM  [name]  has presented today for surgery, with the diagnosis of Bladder Tumor.  The various methods of treatment have been discussed with the patient and family. After consideration of risks, benefits and other options for treatment, the patient has consented to  Procedure(s): CYSTOSCOPY, WITH BIOPSY ([name]) as a surgical intervention.  The patient's history has been reviewed, patient examined, no change in status, stable for surgery.  I have reviewed the patient's chart and labs.  Questions were answered to the patient's satisfaction.    CV: RRR Lungs: Clear   [name] C Cali Hope

## 2024-01-16 NOTE — Anesthesia Postprocedure Evaluation (Signed)
 Anesthesia Post Note  Patient: [name]  Procedure(s) Performed: CYSTOSCOPY, WITH BIOPSY (Bladder)  Patient location during evaluation: PACU Anesthesia Type: General Level of consciousness: awake and alert Pain management: pain level controlled Vital Signs Assessment: post-procedure vital signs reviewed and stable Respiratory status: spontaneous breathing, nonlabored ventilation and respiratory function stable Cardiovascular status: blood pressure returned to baseline and stable Postop Assessment: no apparent nausea or vomiting Anesthetic complications: no   No notable events documented.   Last Vitals:  Vitals:   01/16/24 1115 01/16/24 1132  BP: (!) 143/65 138/84  Pulse: 74 75  Resp: (!) 21 18  Temp: (!) 36.2 C (!) 36.1 C  SpO2: 97% 95%    Last Pain:  Vitals:   01/16/24 1132  TempSrc:   PainSc: 0-No pain                 Baltazar Bonier

## 2024-01-16 NOTE — Anesthesia Preprocedure Evaluation (Addendum)
 Anesthesia Evaluation  Patient identified by MRN, date of birth, ID band Patient awake    Reviewed: Allergy & Precautions, NPO status , Patient's Chart, lab work & pertinent test results, reviewed documented beta blocker date and time   History of Anesthesia Complications Negative for: history of anesthetic complications  Airway Mallampati: III  TM Distance: >3 FB     Dental  (+) Missing, Edentulous Upper   Pulmonary asthma , sleep apnea , COPD, former smoker   breath sounds clear to auscultation       Cardiovascular hypertension, (-) angina + CAD, + Past MI, + Cardiac Stents (x2), + CABG, + Peripheral Vascular Disease and +CHF  + dysrhythmias (-) pacemaker(-) Cardiac Defibrillator + Valvular Problems/Murmurs  Rhythm:Regular Rate:Normal  IMPRESSIONS     1. Left ventricular ejection fraction, by estimation, is 40 to 45%. The  left ventricle has mildly decreased function. The left ventricle  demonstrates global hypokinesis. Left ventricular diastolic parameters are  consistent with Grade I diastolic  dysfunction (impaired relaxation). The global longitudinal strain is  abnormal.   2. Right ventricular systolic function is normal. The right ventricular  size is normal.   3. Left atrial size was mildly dilated.   4. The mitral valve is normal in structure. Mild mitral valve  regurgitation. No evidence of mitral stenosis.   5. The aortic valve is normal in structure. Aortic valve regurgitation is  not visualized. Aortic valve sclerosis is present, with no evidence of  aortic valve stenosis.   6. The inferior vena cava is normal in size with greater than 50%  respiratory variability, suggesting right atrial pressure of 3 mmHg.     Neuro/Psych  Headaches  Anxiety      Neuromuscular disease CVA, No Residual Symptoms    GI/Hepatic hiatal hernia,GERD  ,,(+) neg Cirrhosis        Endo/Other  diabetes, Type 2     Renal/GU CRFRenal disease     Musculoskeletal  (+) Arthritis , Osteoarthritis,    Abdominal   Peds  Hematology   Anesthesia Other Findings   Reproductive/Obstetrics                             Anesthesia Physical Anesthesia Plan  ASA: 3  Anesthesia Plan: General   Post-op Pain Management:    Induction: Intravenous  PONV Risk Score and Plan: 2 and Ondansetron  and Dexamethasone   Airway Management Planned: LMA  Additional Equipment:   Intra-op Plan:   Post-operative Plan: Extubation in OR  Informed Consent: I have reviewed the patients History and Physical, chart, labs and discussed the procedure including the risks, benefits and alternatives for the proposed anesthesia with the patient or authorized representative who has indicated his/her understanding and acceptance.     Dental advisory given  Plan Discussed with: CRNA  Anesthesia Plan Comments: (See PAT note)       Anesthesia Quick Evaluation

## 2024-01-16 NOTE — Op Note (Signed)
   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 patient with a history of noninvasive high-grade urothelial carcinoma the bladder.  Recent surveillance cystoscopy with recurrent papillary tumor.  After reviewing the management options for treatment, he elected to proceed with the above surgical procedure(s). We have discussed the potential benefits and risks of the procedure, side effects of the proposed treatment, the likelihood of the patient achieving the goals of the procedure, and any potential problems that might occur during the procedure or recuperation. Informed consent has been obtained.  Description of procedure:  The patient was taken to the operating room and general anesthesia was induced.  The patient was placed in the dorsal lithotomy position, prepped and draped in the usual sterile fashion, and preoperative antibiotics were administered. A preoperative time-out was performed.   A 21 French cystoscope was lubricated, placed per urethra and advanced proximally under direct vision.  Unable to negotiate 47 [place] scope through the stricture and the sheath was downsized to [place].  The stricture was dilated with 17 [place] scope.  The 31 [place] scope was then advanced without difficulty.  Panendoscopy was performed with findings as described above.  The 2 sites were completely removed with cold cup biopsy forceps.  Due to tumor location it was difficult to negotiate a Bugbee electrode to this area.  A 28F continuous-flow resectoscope  sheath with visual obturator was lubricated and placed per urethra.  There was no difficulty advancing the resectoscope through the dilated stricture.  The obturator was exchanged for a Iglesias cystoscope with loop.  Biopsy sites were inspected and no visible tumor remained.  The biopsy sites and surrounding mucosa were then fulgurated with the loop.  Hemostasis was noted to be adequate.  The bladder was emptied and the resectoscope was removed  After anesthetic reversal he was then transported to the PACU in stable condition.  A Foley catheter was not placed.  Plan: Will contact patient with the pathology results and further follow-up recommendations will be made at that time   Geraline Knapp, M.D.

## 2024-01-17 ENCOUNTER — Other Ambulatory Visit: Payer: Self-pay

## 2024-01-17 ENCOUNTER — Emergency Department

## 2024-01-17 ENCOUNTER — Ambulatory Visit: Payer: Self-pay | Admitting: Pulmonary Disease

## 2024-01-17 ENCOUNTER — Observation Stay

## 2024-01-17 ENCOUNTER — Observation Stay
Admit: 2024-01-17 | Discharge: 2024-01-17 | Disposition: A | Discharge status: Home / Self Care | Attending: Internal Medicine

## 2024-01-17 ENCOUNTER — Encounter: Payer: Self-pay | Admitting: Urology

## 2024-01-17 ENCOUNTER — Observation Stay
Admission: EM | Admit: 2024-01-17 | Discharge: 2024-01-19 | Disposition: A | Discharge status: Home / Self Care | Attending: Obstetrics and Gynecology | Admitting: Obstetrics and Gynecology

## 2024-01-17 DIAGNOSIS — Z955 Presence of coronary angioplasty implant and graft: Secondary | ICD-10-CM | POA: Insufficient documentation

## 2024-01-17 DIAGNOSIS — I5032 Chronic diastolic (congestive) heart failure: Secondary | ICD-10-CM | POA: Diagnosis not present

## 2024-01-17 DIAGNOSIS — Z951 Presence of aortocoronary bypass graft: Secondary | ICD-10-CM | POA: Insufficient documentation

## 2024-01-17 DIAGNOSIS — I252 Old myocardial infarction: Secondary | ICD-10-CM | POA: Diagnosis not present

## 2024-01-17 DIAGNOSIS — R0789 Other chest pain: Secondary | ICD-10-CM

## 2024-01-17 DIAGNOSIS — E785 Hyperlipidemia, unspecified: Secondary | ICD-10-CM | POA: Diagnosis not present

## 2024-01-17 DIAGNOSIS — R0602 Shortness of breath: Secondary | ICD-10-CM | POA: Diagnosis not present

## 2024-01-17 DIAGNOSIS — Z8551 Personal history of malignant neoplasm of bladder: Secondary | ICD-10-CM | POA: Insufficient documentation

## 2024-01-17 DIAGNOSIS — R079 Chest pain, unspecified: Secondary | ICD-10-CM | POA: Diagnosis not present

## 2024-01-17 DIAGNOSIS — N183 Chronic kidney disease, stage 3 unspecified: Secondary | ICD-10-CM | POA: Diagnosis not present

## 2024-01-17 DIAGNOSIS — E1140 Type 2 diabetes mellitus with diabetic neuropathy, unspecified: Secondary | ICD-10-CM | POA: Diagnosis not present

## 2024-01-17 DIAGNOSIS — I25110 Atherosclerotic heart disease of native coronary artery with unstable angina pectoris: Principal | ICD-10-CM | POA: Diagnosis present

## 2024-01-17 DIAGNOSIS — C679 Malignant neoplasm of bladder, unspecified: Secondary | ICD-10-CM | POA: Diagnosis present

## 2024-01-17 DIAGNOSIS — I088 Other rheumatic multiple valve diseases: Secondary | ICD-10-CM | POA: Insufficient documentation

## 2024-01-17 DIAGNOSIS — Z7722 Contact with and (suspected) exposure to environmental tobacco smoke (acute) (chronic): Secondary | ICD-10-CM | POA: Insufficient documentation

## 2024-01-17 DIAGNOSIS — I5022 Chronic systolic (congestive) heart failure: Secondary | ICD-10-CM

## 2024-01-17 DIAGNOSIS — E1122 Type 2 diabetes mellitus with diabetic chronic kidney disease: Secondary | ICD-10-CM | POA: Diagnosis not present

## 2024-01-17 DIAGNOSIS — J4489 Other specified chronic obstructive pulmonary disease: Secondary | ICD-10-CM | POA: Diagnosis present

## 2024-01-17 DIAGNOSIS — Z79899 Other long term (current) drug therapy: Secondary | ICD-10-CM | POA: Insufficient documentation

## 2024-01-17 DIAGNOSIS — Z87891 Personal history of nicotine dependence: Secondary | ICD-10-CM | POA: Diagnosis not present

## 2024-01-17 DIAGNOSIS — I2510 Atherosclerotic heart disease of native coronary artery without angina pectoris: Secondary | ICD-10-CM | POA: Diagnosis not present

## 2024-01-17 DIAGNOSIS — Z7982 Long term (current) use of aspirin: Secondary | ICD-10-CM | POA: Diagnosis not present

## 2024-01-17 DIAGNOSIS — E1169 Type 2 diabetes mellitus with other specified complication: Secondary | ICD-10-CM | POA: Diagnosis present

## 2024-01-17 DIAGNOSIS — R9431 Abnormal electrocardiogram [ECG] [EKG]: Secondary | ICD-10-CM | POA: Diagnosis not present

## 2024-01-17 DIAGNOSIS — N400 Enlarged prostate without lower urinary tract symptoms: Secondary | ICD-10-CM | POA: Diagnosis not present

## 2024-01-17 DIAGNOSIS — Z96611 Presence of right artificial shoulder joint: Secondary | ICD-10-CM | POA: Insufficient documentation

## 2024-01-17 DIAGNOSIS — I10 Essential (primary) hypertension: Secondary | ICD-10-CM | POA: Diagnosis not present

## 2024-01-17 DIAGNOSIS — R918 Other nonspecific abnormal finding of lung field: Secondary | ICD-10-CM | POA: Diagnosis not present

## 2024-01-17 DIAGNOSIS — Z471 Aftercare following joint replacement surgery: Secondary | ICD-10-CM | POA: Diagnosis not present

## 2024-01-17 DIAGNOSIS — Z86718 Personal history of other venous thrombosis and embolism: Secondary | ICD-10-CM | POA: Diagnosis not present

## 2024-01-17 DIAGNOSIS — I200 Unstable angina: Secondary | ICD-10-CM | POA: Diagnosis not present

## 2024-01-17 DIAGNOSIS — R9439 Abnormal result of other cardiovascular function study: Secondary | ICD-10-CM | POA: Diagnosis not present

## 2024-01-17 DIAGNOSIS — I130 Hypertensive heart and chronic kidney disease with heart failure and stage 1 through stage 4 chronic kidney disease, or unspecified chronic kidney disease: Secondary | ICD-10-CM | POA: Insufficient documentation

## 2024-01-17 LAB — BASIC METABOLIC PANEL WITH GFR
Anion gap: 10 (ref 5–15)
BUN: 32 mg/dL — ABNORMAL HIGH (ref 8–23)
CO2: 22 mmol/L (ref 22–32)
Calcium: 8.8 mg/dL — ABNORMAL LOW (ref 8.9–10.3)
Chloride: 106 mmol/L (ref 98–111)
Creatinine, Ser: 1.42 mg/dL — ABNORMAL HIGH (ref 0.61–1.24)
GFR, Estimated: 49 mL/min — ABNORMAL LOW (ref >60)
Glucose, Bld: 124 mg/dL — ABNORMAL HIGH (ref 70–99)
Potassium: 4.2 mmol/L (ref 3.5–5.1)
Sodium: 138 mmol/L (ref 135–145)

## 2024-01-17 LAB — BRAIN NATRIURETIC PEPTIDE: B Natriuretic Peptide: 535.6 pg/mL — ABNORMAL HIGH (ref 0.0–100.0)

## 2024-01-17 LAB — TROPONIN I (HIGH SENSITIVITY)
Troponin I (High Sensitivity): 16 ng/L (ref <18)
Troponin I (High Sensitivity): 18 ng/L — ABNORMAL HIGH (ref <18)

## 2024-01-17 LAB — GLUCOSE, CAPILLARY
Glucose-Capillary: 109 mg/dL — ABNORMAL HIGH (ref 70–99)
Glucose-Capillary: 141 mg/dL — ABNORMAL HIGH (ref 70–99)

## 2024-01-17 LAB — CBC
HCT: 34.5 % — ABNORMAL LOW (ref 39.0–52.0)
Hemoglobin: 11.4 g/dL — ABNORMAL LOW (ref 13.0–17.0)
MCH: 32.2 pg (ref 26.0–34.0)
MCHC: 33 g/dL (ref 30.0–36.0)
MCV: 97.5 fL (ref 80.0–100.0)
Platelets: 191 10*3/uL (ref 150–400)
RBC: 3.54 MIL/uL — ABNORMAL LOW (ref 4.22–5.81)
RDW: 12.7 % (ref 11.5–15.5)
WBC: 8.1 10*3/uL (ref 4.0–10.5)
nRBC: 0 % (ref 0.0–0.2)

## 2024-01-17 LAB — APTT: aPTT: 23 s — ABNORMAL LOW (ref 24–36)

## 2024-01-17 LAB — SURGICAL PATHOLOGY

## 2024-01-17 LAB — PROTIME-INR
INR: 1 (ref 0.8–1.2)
Prothrombin Time: 13.3 s (ref 11.4–15.2)

## 2024-01-17 MED ORDER — ALBUTEROL SULFATE (2.5 MG/3ML) 0.083% IN NEBU: 2.5000 mg | INHALATION_SOLUTION | Freq: Once | RESPIRATORY_TRACT | Status: DC

## 2024-01-17 MED ORDER — ATORVASTATIN CALCIUM 20 MG PO TABS
20.0000 mg | ORAL_TABLET | Freq: Every day | ORAL | Status: DC
  Administered 2024-01-17 – 2024-01-18 (×2): 20 mg via ORAL
  Filled 2024-01-17 (×2): qty 1

## 2024-01-17 MED ORDER — HYDRALAZINE HCL 20 MG/ML IJ SOLN: 10.0000 mg | Freq: Four times a day (QID) | INTRAMUSCULAR | Status: DC | PRN

## 2024-01-17 MED ORDER — BISACODYL 5 MG PO TBEC
5.0000 mg | DELAYED_RELEASE_TABLET | Freq: Every day | ORAL | Status: DC | PRN
Start: 2024-01-17 — End: 2024-01-19

## 2024-01-17 MED ORDER — TAMSULOSIN HCL 0.4 MG PO CAPS
0.4000 mg | ORAL_CAPSULE | Freq: Every day | ORAL | Status: DC
  Administered 2024-01-17 – 2024-01-18 (×2): 0.4 mg via ORAL
  Filled 2024-01-17 (×2): qty 1

## 2024-01-17 MED ORDER — INSULIN ASPART 100 UNIT/ML IJ SOLN
0.0000 [IU] | Freq: Three times a day (TID) | INTRAMUSCULAR | Status: DC
  Administered 2024-01-18 – 2024-01-19 (×2): 2 [IU] via SUBCUTANEOUS
  Filled 2024-01-17 (×2): qty 1

## 2024-01-17 MED ORDER — MONTELUKAST SODIUM 10 MG PO TABS
10.0000 mg | ORAL_TABLET | Freq: Every day | ORAL | Status: DC
  Administered 2024-01-18: 10 mg via ORAL
  Filled 2024-01-17: qty 1

## 2024-01-17 MED ORDER — HEPARIN BOLUS VIA INFUSION
4000.0000 [IU] | Freq: Once | INTRAVENOUS | Status: AC
  Administered 2024-01-17: 4000 [IU] via INTRAVENOUS
  Filled 2024-01-17: qty 4000

## 2024-01-17 MED ORDER — ASPIRIN 81 MG PO TBEC
81.0000 mg | DELAYED_RELEASE_TABLET | Freq: Every day | ORAL | Status: DC
  Administered 2024-01-17 – 2024-01-18 (×2): 81 mg via ORAL
  Filled 2024-01-17 (×3): qty 1

## 2024-01-17 MED ORDER — POLYETHYLENE GLYCOL 3350 17 G PO PACK: 17.0000 g | PACK | Freq: Every day | ORAL | Status: DC | PRN

## 2024-01-17 MED ORDER — ACETAMINOPHEN 650 MG RE SUPP: 650.0000 mg | Freq: Four times a day (QID) | RECTAL | Status: DC | PRN

## 2024-01-17 MED ORDER — SPIRONOLACTONE 12.5 MG HALF TABLET
12.5000 mg | ORAL_TABLET | Freq: Every day | ORAL | Status: DC
  Administered 2024-01-18 – 2024-01-19 (×2): 12.5 mg via ORAL
  Filled 2024-01-17 (×2): qty 1

## 2024-01-17 MED ORDER — ALBUTEROL SULFATE (2.5 MG/3ML) 0.083% IN NEBU
2.5000 mg | INHALATION_SOLUTION | Freq: Once | RESPIRATORY_TRACT | Status: AC
  Administered 2024-01-17: 2.5 mg via RESPIRATORY_TRACT
  Filled 2024-01-17: qty 3

## 2024-01-17 MED ORDER — ENOXAPARIN SODIUM 40 MG/0.4ML IJ SOSY: 40.0000 mg | PREFILLED_SYRINGE | INTRAMUSCULAR | Status: DC

## 2024-01-17 MED ORDER — FUROSEMIDE 20 MG PO TABS: 20.0000 mg | ORAL_TABLET | Freq: Every day | ORAL | Status: DC | PRN

## 2024-01-17 MED ORDER — EZETIMIBE 10 MG PO TABS
10.0000 mg | ORAL_TABLET | Freq: Every day | ORAL | Status: DC
  Administered 2024-01-18: 10 mg via ORAL
  Filled 2024-01-17 (×2): qty 1

## 2024-01-17 MED ORDER — GABAPENTIN 300 MG PO CAPS
300.0000 mg | ORAL_CAPSULE | Freq: Two times a day (BID) | ORAL | Status: DC
  Administered 2024-01-17 – 2024-01-19 (×4): 300 mg via ORAL
  Filled 2024-01-17 (×4): qty 1

## 2024-01-17 MED ORDER — METHYLPREDNISOLONE SODIUM SUCC 40 MG IJ SOLR
40.0000 mg | Freq: Once | INTRAMUSCULAR | Status: AC
  Administered 2024-01-17: 40 mg via INTRAVENOUS
  Filled 2024-01-17: qty 1

## 2024-01-17 MED ORDER — CARVEDILOL 3.125 MG PO TABS
3.1250 mg | ORAL_TABLET | Freq: Two times a day (BID) | ORAL | Status: DC
  Administered 2024-01-17 – 2024-01-18 (×2): 3.125 mg via ORAL
  Filled 2024-01-17 (×3): qty 1

## 2024-01-17 MED ORDER — IOHEXOL 350 MG/ML SOLN
75.0000 mL | Freq: Once | INTRAVENOUS | Status: AC | PRN
  Administered 2024-01-17: 75 mL via INTRAVENOUS

## 2024-01-17 MED ORDER — INSULIN ASPART 100 UNIT/ML IJ SOLN: 0.0000 [IU] | Freq: Every day | INTRAMUSCULAR | Status: DC

## 2024-01-17 MED ORDER — OXYCODONE HCL 5 MG PO TABS: 5.0000 mg | ORAL_TABLET | Freq: Four times a day (QID) | ORAL | Status: DC | PRN

## 2024-01-17 MED ORDER — ISOSORBIDE MONONITRATE ER 30 MG PO TB24
30.0000 mg | ORAL_TABLET | Freq: Every day | ORAL | Status: DC
  Administered 2024-01-18: 30 mg via ORAL
  Filled 2024-01-17 (×2): qty 1

## 2024-01-17 MED ORDER — HEPARIN (PORCINE) 25000 UT/250ML-% IV SOLN
1450.0000 [IU]/h | INTRAVENOUS | Status: DC
  Administered 2024-01-17 – 2024-01-18 (×2): 1250 [IU]/h via INTRAVENOUS
  Filled 2024-01-17 (×3): qty 250

## 2024-01-17 MED ORDER — RANOLAZINE ER 500 MG PO TB12
500.0000 mg | ORAL_TABLET | Freq: Two times a day (BID) | ORAL | Status: DC
  Administered 2024-01-17 – 2024-01-19 (×4): 500 mg via ORAL
  Filled 2024-01-17 (×5): qty 1

## 2024-01-17 MED ORDER — LOSARTAN POTASSIUM 25 MG PO TABS
25.0000 mg | ORAL_TABLET | Freq: Every day | ORAL | Status: DC
  Administered 2024-01-18: 25 mg via ORAL
  Filled 2024-01-17: qty 1

## 2024-01-17 MED ORDER — DIPHENHYDRAMINE HCL 50 MG/ML IJ SOLN: 50.0000 mg | Freq: Once | INTRAMUSCULAR | Status: AC

## 2024-01-17 MED ORDER — DIPHENHYDRAMINE HCL 25 MG PO CAPS
50.0000 mg | ORAL_CAPSULE | Freq: Once | ORAL | Status: AC
  Administered 2024-01-17: 50 mg via ORAL
  Filled 2024-01-17: qty 2

## 2024-01-17 MED ORDER — ALBUTEROL SULFATE (2.5 MG/3ML) 0.083% IN NEBU: 2.5000 mg | INHALATION_SOLUTION | Freq: Four times a day (QID) | RESPIRATORY_TRACT | Status: DC | PRN

## 2024-01-17 MED ORDER — ACETAMINOPHEN 325 MG PO TABS
650.0000 mg | ORAL_TABLET | Freq: Four times a day (QID) | ORAL | Status: DC | PRN
  Administered 2024-01-18 – 2024-01-19 (×3): 650 mg via ORAL
  Filled 2024-01-17 (×3): qty 2

## 2024-01-17 NOTE — ED Provider Notes (Signed)
 [facility] Provider Note    Event Date/Time   First MD Initiated Contact with Patient 01/17/24 1234     (approximate)   History   Chest Pain   HPI  [name] is a 83 y.o. male with a history of CAD, CHF, COPD, CKD, aortic stenosis, diabetes presents with complaints of chest pain.  Patient reports he had bladder operation yesterday, per review of records he had a cystoscopy with biopsy.  He reports last night developed chest discomfort in the center of his chest, this seems to wax and wane, does worsen with exertion and appears.     Physical Exam   Triage Vital Signs: ED Triage Vitals  Encounter Vitals Group     BP 01/17/24 1153 127/88     Systolic BP Percentile --      Diastolic BP Percentile --      Pulse Rate 01/17/24 1153 74     Resp 01/17/24 1153 18     Temp 01/17/24 1153 97.9 F (36.6 C)     Temp Source 01/17/24 1153 Oral     SpO2 01/17/24 1153 98 %     Weight --      Height --      Head Circumference --      Peak Flow --      Pain Score 01/17/24 1154 5     Pain Loc --      Pain Education --      Exclude from Growth Chart --     Most recent vital signs: Vitals:   01/17/24 1153  BP: 127/88  Pulse: 74  Resp: 18  Temp: 97.9 F (36.6 C)  SpO2: 98%     General: Awake, no distress.  CV:  Good peripheral perfusion.  Resp:  Normal effort.  No wheezing Abd:  No distention.  Other:  No lower extremity edema   ED Results / Procedures / Treatments   Labs (all labs ordered are listed, but only abnormal results are displayed) Labs Reviewed  BASIC METABOLIC PANEL WITH GFR - Abnormal; Notable for the following components:      Result Value   Glucose, Bld 124 (*)    BUN 32 (*)    Creatinine, Ser 1.42 (*)    Calcium  8.8 (*)    GFR, Estimated 49 (*)    All other components within normal limits  CBC - Abnormal; Notable for the following components:   RBC 3.54 (*)    Hemoglobin 11.4 (*)    HCT 34.5 (*)    All other  components within normal limits  TROPONIN I (HIGH SENSITIVITY)     EKG  ED ECG REPORT I, Bryson Carbine, the attending physician, personally viewed and interpreted this ECG.  Date: 01/17/2024  Rhythm: normal sinus rhythm QRS Axis: normal Intervals: Abnormal ST/T Wave abnormalities: normal Narrative Interpretation: no evidence of acute ischemia    RADIOLOGY Chest x-ray without acute abnormality    PROCEDURES:  Critical Care performed:   Procedures   MEDICATIONS ORDERED IN ED: Medications  albuterol  (PROVENTIL ) (2.5 MG/3ML) 0.083% nebulizer solution 2.5 mg (2.5 mg Nebulization Given 01/17/24 1303)     IMPRESSION / MDM / ASSESSMENT AND PLAN / ED COURSE  I reviewed the triage vital signs and the nursing notes. Patient's presentation is most consistent with acute presentation with potential threat to life or bodily function.  Patient with significant history of CAD, multiple comorbidities presents with chest pain status post procedure with anesthesia.  He  denies shortness of breath to me, no pleurisy.  Had been feeling better but after he got up to walk to the bathroom developed tightness in his chest.  Reviewed medical records, he does see Dr. Juliaette Ober of cardiology   Lab work today is overall reassuring however given new exertional chest pain long history of CAD and multiple comorbidities will admit to the hospitalist team      FINAL CLINICAL IMPRESSION(S) / ED DIAGNOSES   Final diagnoses:  Chest pain, unspecified type     Rx / DC Orders   ED Discharge Orders     None        Note:  This document was prepared using Dragon voice recognition software and may include unintentional dictation errors.   Bryson Carbine, MD 01/17/24 1321

## 2024-01-17 NOTE — Telephone Encounter (Signed)
 Patient advised to go to ED. Patient agreed. NFN.

## 2024-01-17 NOTE — H&P (Signed)
 [facility] History and Physical  [name]:[id number] DOB: 12/25/1940 DOA: 01/17/2024 PCP: Claire Crick, MD  Presented from: Home Chief Complaint: Chest tightness  History of Present Illness: [name] is a 83 y.o. male with PMH significant for DM2, HTN, HLD, OSA on CPAP, CAD/MI/stent, h/o systolic CHF, carotid artery disease, CKD, COPD, GERD, diverticulosis, anxiety, depression, chronic prostatitis stenosis. Patient presented to the ED with complaint of chest discomfort. 3 weeks ago, patient had total replacement of right shoulder.   Yesterday 6/3, patient underwent cystoscopy with bladder biopsy/fulguration by Dr. Cherylene Corrente. Reports compliance to home meds including Lasix , aspirin , Coreg .  Not on any anticoagulant.  Last night, patient developed midsternal chest discomfort which seems to wax and wane also worsens with exertion. He was called this morning from the urologist office for postprocedure follow-up, he reported chest symptoms and hence he was urged to come to the ED.  In the ED, patient febrile, hemodynamically stable, breathing on room air at rest. Labs showed WC count of 8.1, hemoglobin 11.4, troponin normal at 16, proBNP pending, BUN/creatinine 32/1.42. Chest x-ray unremarkable EKG showed normal sinus rhythm at 71 bpm, QTc 419 ms, LAFB no significant change from 2 months ago. Given significant cardiac history, hospitalist consultation was requested  At the time of my evaluation, patient was propped up in bed.  Continued to have mild chest tightness.  Not on supplemental oxygen .  Family was at bedside. History reviewed as above.  Review of Systems:  All systems were reviewed and were negative unless otherwise mentioned in the HPI   Past medical history: Past Medical History:  Diagnosis Date   Actinic keratosis    Anginal pain (HCC)    Anxiety    Aortic ectasia, abdominal (HCC)    a.) CT abd 01/01/2022: 2.9 cm infrarenal abdominal aorta    Aortic stenosis    Bladder cancer (HCC) 2023   BPH (benign prostatic hyperplasia) 09/06/2007   CAD s/p CABG    a.) 1990 s/p MI--> CABG x 3; b.) LHC/PCI 2002 --> BMS (unk type) to LCx   Carotid arterial disease (HCC)    a.) 08/2016 Carotid U/S: <39% bilat; b.) carotid doppler 10/12/2022: mild-to-moderate (<50%) bilateral bifurcation plaque.   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 06/2019 Echo (in setting of COVID): EF 35-40%, mild LVH, g1 DD, glob HK; b. 09/2019 Echo: EF 50-55%, Gr1 DD; c. 03/2021 Echo: EF 50-55%, no rwma, mild LVH, GrI DD, nl RV fxn. RVSP 44.29mmHg. Mild-mod dil LA. Triv MR. Mild-mod Ao sclerosis.   History of 2019 novel coronavirus disease (COVID-19) 05/20/2019   History of hiatal hernia    History of shingles    HLD (hyperlipidemia) 04/26/2007   HTN (hypertension) 04/30/2007   LBBB (left bundle branch block)    Long-term use of aspirin  therapy  Lumbar disc disease with radiculopathy    Migraines    Myocardial infarction (HCC) 1990   a.) resulted in 3v CABG   Myocardial infarction (HCC) 2002   a.) second cardiac event; PCI with placement of BMS (unknown type) to LCx   Neuropathy of both feet    OSA on CPAP    Osteoarthritis    PAD (peripheral artery disease) (HCC)    a. 07/2017 LE duplex: RSFA 75-15m, LSFA 75-61m, 50-74d; c. 08/2018 Periph Angio: No  signif AoIliac dzs. Mod-sev Ca2+ RSFA w/ diff dzs throughout- 3 vessel runoff. Borderline signif LSFA dzs w/ mod-sev Ca2+ vessels and 3 vessel runoff below the knee-->Med rx.   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   Right mid dorsum forearm - in situ - EDC   T2DM (type 2 diabetes mellitus) (HCC)    Traumatic open wound of lower leg with delayed healing, left 12/28/2022    Past surgical history: Past Surgical History:  Procedure Laterality Date   ABDOMINAL AORTOGRAM W/LOWER EXTREMITY [name] 09/12/2018   Procedure: ABDOMINAL AORTOGRAM W/LOWER EXTREMITY;  Surgeon: Wenona Hamilton, MD;  Location: [facility];  Service: Cardiovascular;  Laterality: [name];   ANTERIOR CERVICAL DECOMP/DISCECTOMY FUSION [name] 07/11/2022   Anterior Cervical Decompression Fusion ,Interboy Prothesis,Plate/Screws , Cervical four-five,Cervical five-six Larrie Po, Susana Enter, MD)   APPENDECTOMY     rupture   BACK SURGERY     1984 and then another one [facility]   BLADDER INSTILLATION [name] 10/19/2021   Procedure: BLADDER INSTILLATION OF GEMCITABINE ;  Surgeon: Geraline Knapp, MD;  Location: [facility];  Service: Urology;  Laterality: [name];   BLADDER INSTILLATION [name] 11/15/2022   Procedure: BLADDER INSTILLATION OF GEMCITABINE ;  Surgeon: Geraline Knapp, MD;  Location: [facility];  Service: Urology;  Laterality: [name];   CARDIAC CATHETERIZATION [name] 07/07/2015   Procedure: Left Heart Cath and Cors/Grafts Angiography;  Surgeon: Devorah Fonder, MD;  Location: [facility];  Service: Cardiovascular;  Laterality: [name];   CATARACT EXTRACTION, BILATERAL  2009   CERVICAL SPINE SURGERY  12/2016   cervical stenosis Larrie Po)    COLONOSCOPY  03/2010   HP polyp, diverticulosis, rpt 10 yrs ([name])   CORONARY ANGIOPLASTY  11/30/2000   LCX   CORONARY ARTERY BYPASS GRAFT  1990   3 vessel    CORONARY STENT INTERVENTION [name] 01/13/2023   Procedure: CORONARY STENT INTERVENTION;  Surgeon: Wenona Hamilton, MD;  Location: [facility];  Service: Cardiovascular;  Laterality: [name];   CYSTOSCOPY  12/23/2010   [facility]   CYSTOSCOPY WITH BIOPSY [name] 01/16/2024   Procedure: CYSTOSCOPY, WITH BIOPSY;  Surgeon: Geraline Knapp, MD;  Location: [facility];  Service: Urology;  Laterality: [name];   KNEE ARTHROSCOPY Right    x2   LAMINOTOMY  1986   L5/S1 lumbar laminotomy for two ruptured discs/fusion   LUMBAR LAMINECTOMY/DECOMPRESSION MICRODISCECTOMY [name] 07/13/2016   Procedure: LUMBAR TWO-THREE, LUMBAR THREE-FOUR, LUMBAR FOUR-FIVE LAMINECTOMY AND FORAMINOTOMY;  Surgeon: Garry Kansas, MD;  Location: [facility];  Service: Neurosurgery;  Laterality: [name];  LAMINECTOMY AND FORAMINOTOMY L2-L3, L3-L4,L4-L5   LUMBAR SPINE SURGERY  06/2020   Bilateral redo laminectomy/laminotomy/foraminotomies/medial facetectomy to decompress  the bilateral L4 and L5 nerve roots Larrie Po)   REVERSE SHOULDER ARTHROPLASTY Right 12/21/2023   Procedure: ARTHROPLASTY, SHOULDER, TOTAL, REVERSE;  Surgeon: Ellard Gunning, MD;  Location: [facility];  Service: Orthopedics;  Laterality: Right;   RIGHT/LEFT HEART CATH AND CORONARY ANGIOGRAPHY Bilateral 01/13/2023   Procedure: RIGHT/LEFT HEART CATH AND CORONARY ANGIOGRAPHY;  Surgeon: Wenona Hamilton, MD;  Location: [facility];  Service: Cardiovascular;  Laterality: Bilateral;   TOTAL HIP ARTHROPLASTY Bilateral 1999,2000   TOTAL KNEE ARTHROPLASTY Right 03/06/2017   Procedure: RIGHT TOTAL KNEE ARTHROPLASTY;  Surgeon: Liliane Rei, MD;  Location: [facility];  Service: Orthopedics;  Laterality: Right;   TRANSURETHRAL RESECTION OF BLADDER TUMOR [name] 10/19/2021   Procedure: TRANSURETHRAL RESECTION OF BLADDER TUMOR (TURBT);   Surgeon: Geraline Knapp, MD;  Location: [facility];  Service: Urology;  Laterality: [name];   TRANSURETHRAL RESECTION OF BLADDER TUMOR [name] 11/15/2022   Procedure: TRANSURETHRAL RESECTION OF BLADDER TUMOR (TURBT);  Surgeon: Geraline Knapp, MD;  Location: [facility];  Service: Urology;  Laterality: [name];    Social History:  reports that he quit smoking about 35 years ago. His smoking use included cigarettes. He started smoking about 60 years ago. He has a 25 pack-year smoking history. He has been exposed to tobacco smoke. He has never used smokeless tobacco. He reports that he does not drink alcohol and does not use drugs.  Allergies:  Allergies  Allergen Reactions   Vioxx [Rofecoxib] Other (See Comments)    Hemorrhage    Oxycodone  Other (See Comments)    Could not wake up , felt drunk   Spiriva  Respimat [Tiotropium Bromide Monohydrate ] Other (See Comments)    Elevated bp   Contrast Media [Iodinated Contrast Media] Itching and Rash    Delayed reaction post abdominal aortagram.    Morphine  Nausea Only and Other (See Comments)    Irritability    Vioxx [rofecoxib], Oxycodone , Spiriva  respimat [tiotropium bromide monohydrate ], Contrast media [iodinated contrast media], and Morphine    Family history:  Family History  Problem Relation Age of Onset   Stroke Mother    Heart attack Mother    Diabetes Mother    Stroke Father    Heart disease Father    Polycystic kidney disease Father    Cancer Sister        throat   Polycystic kidney disease Sister    Diabetes Brother        1/2 brother   Cancer Other        5/7 nephews with lung cancer     Physical Exam: Vitals:   01/17/24 1400 01/17/24 1445 01/17/24 1610 01/17/24 1613  BP:  (!) 160/62 (!) 149/79   Pulse: 69 65 71   Resp: 19 15 (!) 22   Temp:    97.7 F (36.5 C)  TempSrc:    Oral  SpO2: 94% 97% 94%   Weight:      Height:       Wt Readings from Last 3 Encounters:  01/17/24 96.3 kg  01/10/24 96.3 kg  12/28/23 96.6 kg    Body mass index is 28.99 kg/m.  General exam: Pleasant, elderly Caucasian male. Skin: No rashes, lesions or ulcers. HEENT: Atraumatic, normocephalic, no obvious bleeding Lungs: Clear to auscultation bilaterally,  CVS: S1, S2, no murmur, no pain on touch GI/Abd: Soft, nontender, nondistended, bowel sound present,   CNS: Alert, awake, oriented x 3 Psychiatry: Mood appropriate,  Extremities: No pedal edema, no calf tenderness,    ----------------------------------------------------------------------------------------------------------------------------------------- ----------------------------------------------------------------------------------------------------------------------------------------- -----------------------------------------------------------------------------------------------------------------------------------------  Assessment/Plan: Principal Problem:   Unstable angina (HCC)  Chest pressure Presented with chest tightness Concerning symptom in the setting of prior CAD/MI, CABG. Had right shoulder surgery 3 weeks ago and cystoscopy yesterday.  Chest x-ray unremarkable so probably not related to volume overload with procedure. Reports compliance to meds including aspirin , Plavix , statin, Lasix  First troponin normal Pending second troponin Obtain echocardiogram. Consult cardiology for stress test inpatient versus outpatient  Rule out PE Discussed with cardiology.  Concerned about risk of PE given 2 recent surgeries and history of cancer. Will obtain CT angio to rule out PE.  Needs to be premedicated with IV Solu-Cortef  and IV Benadryl  because of history of contrast allergy.    H/o CAD/MI, CABG HLD Continue aspirin , Plavix , statin, Zetia , Ranexa , Coreg , Imdur   Mild systolic CHF HTN Most recent echo from January 2025 with EF 40 to 45% PTA meds-Coreg , Imdur , losartan , Lasix , Aldactone  Reports compliance to all.  Blood pressure in normal range. Resume  all  Type 2 diabetes mellitus Diabetic neuropathy A1c 6 on 12/13/2023 PTA meds-none Continue gabapentin  Recent Labs  Lab 01/16/24 1053  GLUCAP 124*    BPH Flomax   Mobility: Encourage ambulation  Goals of care:   Code Status: Full Code    DVT prophylaxis:  enoxaparin  (LOVENOX ) injection 40 mg Start: 01/17/24 2200   Antimicrobials: None Fluid: None Consultants: Cardiology Family Communication: Family at bedside  Status: Observation Level of care:  Telemetry Cardiac   Patient is from: Home Anticipated d/c to: Pending clinical course  Diet:  Diet Order             Diet NPO time specified Except for: Sips with Meds  Diet effective midnight                    ------------------------------------------------------------------------------------- Severity of Illness: The appropriate patient status for this patient is OBSERVATION. Observation status is judged to be reasonable and necessary in order to provide the required intensity of service to ensure the patient's safety. The patient's presenting symptoms, physical exam findings, and initial radiographic and laboratory data in the context of their medical condition is felt to place them at decreased risk for further clinical deterioration. Furthermore, it is anticipated that the patient will be medically stable for discharge from the hospital within 2 midnights of admission.  ------------------------------------------------------------------------------------- Recon  Abx  Home Meds: Prior to Admission medications   Medication Sig Start Date End Date Taking? Authorizing Provider  albuterol  (PROVENTIL ) (2.5 MG/3ML) 0.083% nebulizer solution TAKE 3 MLS BY NEBULIZATION EVERY 6 HOURSAS NEEDED FOR WHEEZING OR SHORTNESS OF BREATH 11/01/23   Marc Senior, MD  albuterol  (VENTOLIN  HFA) 108 [phone] Base) MCG/ACT inhaler TAKE 2 PUFFS INTO LUNGS EVERY 6 HOURS ASNEEDED FOR WHEEZING OR SHORTNESS OF BREATH 11/01/23   Claire Crick, MD  amoxicillin  (AMOXIL ) 500 MG capsule TAKE 4 CAPSULES 30-60 MINUTES PRIOR TO PROCEDURE 11/20/23   Claire Crick, MD  aspirin  EC 81 MG EC tablet Take 1 tablet (81 mg total) by mouth daily. 06/26/19   Elgergawy, Ardia Kraft, MD  atorvastatin  (LIPITOR) 20 MG tablet TAKE 1 TABLET BY MOUTH DAILY 10/10/23   Gollan, Timothy J, MD  carvedilol  (COREG ) 3.125 MG tablet TAKE 1 TABLET BY MOUTH TWICE DAILY 09/19/23   Gollan, Timothy J, MD  Cholecalciferol (VITAMIN D3) 25 MCG (1000 UT) CAPS Take 1 capsule (1,000 Units total) by mouth 2 (two) times a week. 03/09/23   Claire Crick, MD  Cyanocobalamin  (VITAMIN B-12 ER PO) Take 1 capsule  by mouth daily.    [provider]  cyclobenzaprine  (FLEXERIL ) 10 MG tablet Take 0.5 tablets (5 mg total) by mouth 3 (three) times daily as needed for muscle spasms. 12/21/23   Shuford, Sherrlyn Dolores, PA-C  docusate sodium  (COLACE) 100 MG capsule Take 1 capsule (100 mg total) by mouth daily as needed for mild constipation. 03/07/23   Claire Crick, MD  Dupilumab (DUPIXENT) 300 MG/2ML SOAJ Inject 300 mg into the skin every 14 (fourteen) days. **patient completed loading dose in clinic** 08/29/23   Marc Senior, MD  ezetimibe  (ZETIA ) 10 MG tablet TAKE 1 TABLET BY MOUTH DAILY 09/28/23   Gollan, Timothy J, MD  famotidine  (PEPCID ) 20 MG tablet Take 20 mg by mouth daily.    [provider]  fluticasone  (FLONASE ) 50 MCG/ACT nasal spray Place 1 spray into both nostrils daily as needed for allergies or rhinitis.    [provider]  Fluticasone -Umeclidin-Vilant (TRELEGY ELLIPTA ) 200-62.5-25 MCG/ACT AEPB Inhale 1 puff into the lungs daily. 09/05/23   Marc Senior, MD  furosemide  (LASIX ) 40 MG tablet Take 0.5 tablets (20 mg total) by mouth daily as needed. Take 1 tablet (40 mg) daily as need for weight greater then 208. 11/16/23   Hammock, Sheri, NP  gabapentin  (NEURONTIN ) 600 MG tablet Take 1 tablet (600 mg total) by mouth 3 (three) times daily. 03/07/23    Claire Crick, MD  hydrocortisone  2.5 % cream Apply topically 2 (two) times daily as needed (Rash). 06/12/23   Harris Liming, MD  isosorbide  mononitrate (IMDUR ) 30 MG 24 hr tablet TAKE 1 TABLET BY MOUTH DAILY. 12/19/23   Wenona Hamilton, MD  losartan  (COZAAR ) 25 MG tablet Take 1 tablet (25 mg total) by mouth daily. 10/06/23 01/16/24  Ronald Cockayne, NP  montelukast  (SINGULAIR ) 10 MG tablet TAKE 1 TABLET BY MOUTH DAILY 08/01/23   Marc Senior, MD  mupirocin  ointment (BACTROBAN ) 2 % Apply 1 Application topically 2 (two) times daily. Patient taking differently: Apply 1 Application topically daily as needed (irritation). 01/10/23   Claire Crick, MD  nitroGLYCERIN  (NITROSTAT ) 0.4 MG SL tablet Place 1 tablet (0.4 mg total) under the tongue every 5 (five) minutes as needed for chest pain. 05/01/21   Claire Crick, MD  ondansetron  (ZOFRAN ) 4 MG tablet Take 1 tablet (4 mg total) by mouth every 8 (eight) hours as needed for nausea or vomiting. 12/21/23   Shuford, Sherrlyn Dolores, PA-C  polyethylene glycol (MIRALAX  / GLYCOLAX ) 17 g packet Take 17 g by mouth daily as needed for mild constipation.     [provider]  ranolazine  (RANEXA ) 500 MG 12 hr tablet TAKE ONE (1) TABLET BY MOUTH TWO TIMES PER DAY 01/11/24   Gollan, Timothy J, MD  spironolactone  (ALDACTONE ) 25 MG tablet Take 0.5 tablets (12.5 mg total) by mouth daily. 10/31/23 01/29/24  Furth, Cadence H, PA-C  tamsulosin  (FLOMAX ) 0.4 MG CAPS capsule TAKE 1 CAPSULE BY MOUTH AT BEDTIME 09/19/23   Stoioff, Kizzie Perks, MD    Labs on Admission:   CBC: Recent Labs  Lab 01/17/24 1156  WBC 8.1  HGB 11.4*  HCT 34.5*  MCV 97.5  PLT 191    Basic Metabolic Panel: Recent Labs  Lab 01/17/24 1156  NA 138  K 4.2  CL 106  CO2 22  GLUCOSE 124*  BUN 32*  CREATININE 1.42*  CALCIUM  8.8*    Liver Function Tests: No results for input(s): "AST", "ALT", "ALKPHOS", "BILITOT", "PROT", "ALBUMIN " in the last 168 hours. No results for input(s):  "  LIPASE", "AMYLASE" in the last 168 hours. No results for input(s): "AMMONIA" in the last 168 hours.  Cardiac Enzymes: No results for input(s): "CKTOTAL", "CKMB", "CKMBINDEX", "TROPONINI" in the last 168 hours.  BNP (last 3 results) Recent Labs    01/17/24 1156  BNP 535.6*    ProBNP (last 3 results) Recent Labs    12/28/23 1601  PROBNP 329.0*    CBG: Recent Labs  Lab 01/16/24 1053  GLUCAP 124*    Lipase     Component Value Date/Time   LIPASE 25 01/01/2022 2145     Urinalysis    Component Value Date/Time   COLORURINE YELLOW 01/01/2022 2027   APPEARANCEUR Clear 01/05/2024 1300   LABSPEC 1.013 01/01/2022 2027   PHURINE 5.0 01/01/2022 2027   GLUCOSEU Negative 01/05/2024 1300   HGBUR NEGATIVE 01/01/2022 2027   HGBUR negative 07/02/2008 0832   BILIRUBINUR Negative 01/05/2024 1300   KETONESUR NEGATIVE 01/01/2022 2027   PROTEINUR Negative 01/05/2024 1300   PROTEINUR NEGATIVE 01/01/2022 2027   UROBILINOGEN 0.2 12/28/2023 1605   UROBILINOGEN 0.2 07/02/2008 0832   NITRITE Negative 01/05/2024 1300   NITRITE NEGATIVE 01/01/2022 2027   LEUKOCYTESUR Negative 01/05/2024 1300   LEUKOCYTESUR NEGATIVE 01/01/2022 2027     Drugs of Abuse  No results found for: "LABOPIA", "COCAINSCRNUR", "LABBENZ", "AMPHETMU", "THCU", "LABBARB"    Radiological Exams on Admission: DG Chest 2 View Result Date: 01/17/2024 CLINICAL DATA:  Chest pain. EXAM: CHEST - 2 VIEW COMPARISON:  October 24, 2023. FINDINGS: The heart size and mediastinal contours are within normal limits. Both lungs are clear. Status post coronary artery bypass graft and right shoulder arthroplasty. IMPRESSION: No active cardiopulmonary disease. Electronically Signed   By: Rosalene Colon M.D.   On: 01/17/2024 12:28     Signed, Hoyt Macleod, MD [facility] 01/17/2024

## 2024-01-17 NOTE — ED Notes (Signed)
 Attempted to start IV. Pt refused and states he will only allow US  IV to be started.

## 2024-01-17 NOTE — Consult Note (Signed)
 Pharmacy Consult Note - Anticoagulation  Pharmacy Consult for heparin  Indication: chest pain/ACS  PATIENT MEASUREMENTS: Height: 5' 11.75" (182.2 cm) Weight: 96.3 kg (212 lb 4.9 oz) IBW/kg (Calculated) : 77.03 HEPARIN  DW (KG): 96.3  VITAL SIGNS: Temp: 97.7 F (36.5 C) (06/04 1613) Temp Source: Oral (06/04 1613) BP: 149/79 (06/04 1610) Pulse Rate: 71 (06/04 1610)  Recent Labs    01/17/24 1156 01/17/24 1444  HGB 11.4*  --   HCT 34.5*  --   PLT 191  --   CREATININE 1.42*  --   TROPONINIHS 16 18*    Estimated Creatinine Clearance: 48 mL/min (A) (by C-G formula based on SCr of 1.42 mg/dL (H)).  PAST MEDICAL HISTORY: Past Medical History:  Diagnosis Date   Actinic keratosis    Anginal pain (HCC)    Anxiety    Aortic ectasia, abdominal (HCC)    a.) CT abd 01/01/2022: 2.9 cm infrarenal abdominal aorta   Aortic stenosis    Bladder cancer (HCC) 2023   BPH (benign prostatic hyperplasia) 09/06/2007   CAD s/p CABG    a.) 1990 s/p MI--> CABG x 3; b.) LHC/PCI 2002 --> BMS (unk type) to LCx   Carotid arterial disease (HCC)    a.) 08/2016 Carotid U/S: <39% bilat; b.) carotid doppler 10/12/2022: mild-to-moderate (<50%) bilateral bifurcation plaque.   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 06/2019 Echo (in setting of COVID): EF 35-40%, mild LVH, g1 DD, glob HK; b. 09/2019  Echo: EF 50-55%, Gr1 DD; c. 03/2021 Echo: EF 50-55%, no rwma, mild LVH, GrI DD, nl RV fxn. RVSP 44.79mmHg. Mild-mod dil LA. Triv MR. Mild-mod Ao sclerosis.   History of 2019 novel coronavirus disease (COVID-19) 05/20/2019   History of hiatal hernia    History of shingles    HLD (hyperlipidemia) 04/26/2007   HTN (hypertension) 04/30/2007   LBBB (left bundle branch block)    Long-term use of aspirin  therapy    Lumbar disc disease with radiculopathy    Migraines    Myocardial infarction (HCC) 1990   a.) resulted in 3v CABG   Myocardial infarction (HCC) 2002   a.) second cardiac event; PCI with placement of BMS (unknown type) to LCx   Neuropathy of both feet    OSA on CPAP    Osteoarthritis    PAD (peripheral artery disease) (HCC)    a. 07/2017 LE duplex: RSFA 75-72m, LSFA 75-67m, 50-74d; c. 08/2018 Periph Angio: No signif AoIliac dzs. Mod-sev Ca2+ RSFA w/ diff dzs throughout- 3 vessel runoff. Borderline signif LSFA dzs w/ mod-sev Ca2+ vessels and 3 vessel runoff below the knee-->Med rx.   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 male with PMH including CAD s/p CABGx3, HTN, PAD, VTE is presenting with chest pain/ACS. Patient is not on chronic anticoagulation per chart review. Pharmacy has been consulted to initiate and manage heparin  intravenous infusion.  Pertinent medications: No chronic anticoagulation prior to admission per chart review  Goal(s) of therapy: Heparin  level 0.3 - 0.7 units/mL Monitor platelets by  anticoagulation protocol: Yes   Baseline anticoagulation labs: Recent Labs    01/17/24 1156  HGB 11.4*  PLT 191    Date Time aPTT/HL Rate/Comment   PLAN: Give 4000 units bolus x1; then start heparin  infusion at 1250 units/hour. Check heparin  level in 8 hours, then daily once at least two levels are consecutively therapeutic. Monitor CBC daily while on heparin  infusion.   Will M. Alva Jewels, PharmD Clinical Pharmacist 01/17/2024 4:53 PM

## 2024-01-17 NOTE — ED Triage Notes (Signed)
 Pt comes in via pov with complaints of cp and shob that started last night. And has continued into this morning. Pt states that he had bladder surgery yesterday, and a total replacement on the right shoulder 3 weeks ago. Pt complains of shob with ambulation, and with not moving. Pt has a history of heart attack.  Pt is alert and oriented x4 with no signs of acute distress a this time.

## 2024-01-17 NOTE — Telephone Encounter (Signed)
 FYI Only or Action Required?: Action required by provider  Patient is followed in Pulmonology for asthma and COPD, last seen on 01/17/2024 by Marc Senior, MD. Called Nurse Triage reporting Chest Pain. Symptoms began yesterday. Interventions attempted: Rescue inhaler. Symptoms are: rapidly worsening since surgery yesterday.  Triage Disposition: Call EMS 911 Now  Patient/caregiver understands and will follow disposition?: No, refuses disposition - Pt states surgeon wants him to follow up with pulm.       Copied from CRM [id number]. Topic: Clinical - Red Word Triage >> Jan 17, 2024  9:26 AM [name] wrote: Red Word that prompted transfer to Nurse Triage: Patient states he's had  2 surgeries in the last month, experiencing discomfort in his lungs - pain in chest while breathing.  Reason for Disposition  [1] Chest pain lasts > 5 minutes AND [2] age > 70  Answer Assessment - Initial Assessment Questions E2C2 Pulmonary Triage - Initial Assessment Questions "Chief Complaint (e.g., cough, sob, wheezing, fever, chills, sweat or additional symptoms) *Go to specific symptom protocol after initial questions. Pain starts right at top of rib cage and when take deep breath or just when breathe there's [name] pain, don't think it's heart, really it's my lungs Been put to sleep twice in the past month, not sure if that's got anything to do with it Not feeling good Just very discomfort when breathe No nausea Seems to get worse, got [name] cough too, not coughing anything up Wouldn't go to ED anyway Talked to surgeon's office today, told me to follow up with pulm No nausea, sweating, dizziness, weakness  "How long have symptoms been present?" Right after had surgery yesterday, bladder cancer removed yesterday  MEDICINES:   "Have you used any OTC meds to help with symptoms?" No  "Have you used your inhalers/maintenance medication?" Yes If yes, "What medications?" Doesn't seem to help Used rescue inhaler  couple times yesterday not this morning  OXYGEN : "Do you wear supplemental oxygen ?" No  "Do you monitor your oxygen  levels?" Yes If yes, "What is your reading (oxygen  level) today?" 99%  2. RADIATION: "Does the pain go anywhere else?" (e.g., into neck, jaw, arms, back)     No just in middle near esophagus 4. PATTERN: "Does the pain come and go, or has it been constant since it started?"  "Does it get worse with exertion?"      About constant right now, just started worse this morning than it was yesterday 6. SEVERITY: "How bad is the pain?"  (e.g., Scale 1-10; mild, moderate, or severe)    - MILD (1-3): doesn't interfere with normal activities     - MODERATE (4-7): interferes with normal activities or awakens from sleep    - SEVERE (8-10): excruciating pain, unable to do any normal activities       3-4/10, not [name] 10 or anything 7. CARDIAC RISK FACTORS: "Do you have any history of heart problems or risk factors for heart disease?" (e.g., angina, prior heart attack; diabetes, high blood pressure, high cholesterol, smoker, or strong family history of heart disease)     significant 8. PULMONARY RISK FACTORS: "Do you have any history of lung disease?"  (e.g., blood clots in lung, asthma, emphysema, birth control pills)     significant  Protocols used: Chest Pain-[name]-AH

## 2024-01-17 NOTE — Telephone Encounter (Signed)
 Patient has an CARDIAC history with the worsening chest pain after general anesthesia needs to be evaluated in the emergency room.

## 2024-01-17 NOTE — Consult Note (Signed)
 Cardiology Consultation   Patient ID: [name] MRN: [id number]; DOB: Nov 27, 1940  Admit date: 01/17/2024 Date of Consult: 01/17/2024  PCP:  Claire Crick, MD   [facility] Providers Cardiologist:  Belva Boyden, MD        Patient Profile: [name] is a 83 y.o. 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 Gwynneth Lessen.  History of Present Illness:   [name] underwent two-vessel CABG in 1990.  LHC in 2002 with successful PCI/BMS to the LCx.  LHC from 06/2015 showed 2 to patent grafts and patent LCx stent.  He is known to have small vessel and diffuse RCA disease which has been medically managed.  In 08/2018, he underwent lower extremity arterial angiography, in the setting of ongoing lower extremity pain and abnormal duplex.  This showed diffuse and calcified bilateral SFA disease with three-vessel runoff below the knee bilaterally.  Medical therapy was recommended.  He was admitted to the hospital 05/2019 with COVID.  Echo at that time showed new LV systolic function with EF of 35 to 40%.  Follow-up echo in early 2021 showed improvement in LV systolic function with EF of 50 to 55%.  He had a low risk stress test in 05/2020 which was performed prior to back surgery.  In 03/2021 he complained of chest tightness and dyspnea.  Subsequent echo demonstrated EF of 50 to 55%.  He subsequently underwent Lexiscan  MPI 04/2021, in the setting of admission for unstable angina and dyspnea, which was low risk and without evidence of ischemia.   PFTs in 03/2021 showed mild to moderate obstructive lung disease.  Carotid artery ultrasound in 11/2022 showed less than 50% bilateral ICA stenosis.  He has been treated for COPD exacerbation/bronchitis with antibiotics.  During this time he underwent echo on 11/2022 which demonstrated EF of 40 to 45%, global hypokinesis, G1 DD, mild MR, and mild aortic stenosis with mean gradient of 12 mmHg.  Lexiscan  MPI 12/2022 was high risk with a moderate in size, severe, partially reversible basal and mid inferior/inferolateral defect consistent with ischemia and an element of scar.  LVEF 25 to 35%.  CT imaging showed post CABG findings with coronary artery calcification aortic atherosclerosis.  When compared to study from 2022, the inferior/inferolateral defect and reduced LVEF for new.  He underwent right and left heart catheterization 12/2022 that showed left dominant coronary arteries with severe three-vessel CAD.  Patent grafts including LIMA to LAD, saphenous to the 1st and 2nd diagonals, patent mid left circumflex stent.  He was noted to have subacute total occlusion of the mid to distal left circumflex with collaterals from the LAD.  Right heart catheter showed mildly elevated RA pressures, moderate pulmonary hypertension, and moderately to severely elevated wedge pressures with normal cardiac output.  He was treated with DES in the mid/distal left circumflex.  He was started on DAPT with aspirin  and Plavix  for minimum of 6 months.  Repeat limited echo 08/2023 showed EF 40 to 45%, G1 DD, and mild MR.    Patient recently underwent reverse total shoulder surgery approximately 4 weeks ago and cystoscopy with  bladder biopsy/fulguration yesterday.  Patient reports that since yesterday has been experiencing intermittent chest tightness which is associated with shortness of breath.  This initially came on when he was at rest, but has been coming and going since.  It is sometimes associated with exertion but other times occurs at  rest.  Patient reports that this discomfort in his chest is not consistent with prior heart attack related chest pain.  He reports that he had some increased swelling in his abdomen and lower extremities yesterday for which he took an extra dose of Lasix  with improvement.  He also notes swelling in the right upper extremity, which he recently had surgery on.  He otherwise denies palpitations, nausea, diaphoresis, lower extremity pain, orthopnea, and PND.  He denies bleeding and hematuria.  The urologist office called this morning to follow-up on his procedure.  He reported the symptoms and was recommended to report to the emergency department for further evaluation  In the ED, vital signs were within normal limits.  Labs notable for BUN 32 and creatinine 1.42, which appear near baseline for patient.  Hemoglobin 11.4 and hematocrit 34.5.  Troponin 16 > 18.  BNP mildly elevated at 535.  EKG is without acute ischemic changes.  Chest x-ray without acute process.  Given extensive cardiac history with chest pain felt to be consistent with unstable angina, cardiology was asked to consult for further evaluation.  Past Medical History:  Diagnosis Date   Actinic keratosis    Anginal pain (HCC)    Anxiety    Aortic ectasia, abdominal (HCC)    a.) CT abd 01/01/2022: 2.9 cm infrarenal abdominal aorta   Aortic stenosis    Bladder cancer (HCC) 2023   BPH (benign prostatic hyperplasia) 09/06/2007   CAD s/p CABG    a.) 1990 s/p MI--> CABG x 3; b.) LHC/PCI 2002 --> BMS (unk type) to LCx   Carotid arterial disease (HCC)    a.) 08/2016 Carotid U/S: <39% bilat; b.) carotid doppler 10/12/2022: mild-to-moderate (<50%) bilateral bifurcation plaque.   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 06/2019 Echo (in setting of COVID): EF 35-40%, mild LVH, g1 DD, glob HK; b. 09/2019 Echo: EF 50-55%, Gr1 DD; c. 03/2021 Echo: EF 50-55%, no rwma, mild LVH, GrI DD, nl RV fxn. RVSP 44.67mmHg. Mild-mod dil LA. Triv MR. Mild-mod Ao sclerosis.   History of 2019 novel coronavirus disease (COVID-19) 05/20/2019   History of hiatal hernia    History of shingles    HLD (hyperlipidemia) 04/26/2007   HTN (hypertension) 04/30/2007   LBBB (left bundle branch block)    Long-term use of aspirin  therapy    Lumbar disc disease with radiculopathy    Migraines    Myocardial infarction (HCC) 1990   a.) resulted in 3v CABG   Myocardial infarction [facility]) 2002  a.) second cardiac event; PCI with placement of BMS (unknown type) to LCx   Neuropathy of both feet    OSA on CPAP    Osteoarthritis    PAD (peripheral artery disease) (HCC)    a. 07/2017 LE duplex: RSFA 75-32m, LSFA 75-19m, 50-74d; c. 08/2018 Periph Angio: No signif AoIliac dzs. Mod-sev Ca2+ RSFA w/ diff dzs throughout- 3 vessel runoff. Borderline signif LSFA dzs w/ mod-sev Ca2+ vessels and 3 vessel runoff below the knee-->Med rx.   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   Right mid dorsum forearm - in situ - EDC   T2DM (type 2 diabetes mellitus) (HCC)    Traumatic open wound of  lower leg with delayed healing, left 12/28/2022    Past Surgical History:  Procedure Laterality Date   ABDOMINAL AORTOGRAM W/LOWER EXTREMITY [name] 09/12/2018   Procedure: ABDOMINAL AORTOGRAM W/LOWER EXTREMITY;  Surgeon: Wenona Hamilton, MD;  Location: [facility];  Service: Cardiovascular;  Laterality: [name];   ANTERIOR CERVICAL DECOMP/DISCECTOMY FUSION [name] 07/11/2022   Anterior Cervical Decompression Fusion ,Interboy Prothesis,Plate/Screws , Cervical four-five,Cervical five-six Larrie Po, Susana Enter, MD)   APPENDECTOMY     rupture   BACK SURGERY     1984 and then another one [facility]   BLADDER INSTILLATION [name] 10/19/2021   Procedure: BLADDER INSTILLATION OF GEMCITABINE ;  Surgeon: Geraline Knapp, MD;  Location: [facility] ORS;  Service: Urology;  Laterality: [name];   BLADDER INSTILLATION [name] 11/15/2022   Procedure: BLADDER INSTILLATION OF GEMCITABINE ;  Surgeon: Geraline Knapp, MD;  Location: [facility];  Service: Urology;  Laterality: [name];   CARDIAC CATHETERIZATION [name] 07/07/2015   Procedure: Left Heart Cath and Cors/Grafts Angiography;  Surgeon: Devorah Fonder, MD;  Location: [facility];  Service: Cardiovascular;  Laterality: [name];   CATARACT EXTRACTION, BILATERAL  2009   CERVICAL SPINE SURGERY  12/2016   cervical stenosis Larrie Po)   COLONOSCOPY  03/2010   HP polyp, diverticulosis, rpt 10 yrs ([name])   CORONARY ANGIOPLASTY  11/30/2000   LCX   CORONARY ARTERY BYPASS GRAFT  1990   3 vessel    CORONARY STENT INTERVENTION [name] 01/13/2023   Procedure: CORONARY STENT INTERVENTION;  Surgeon: Wenona Hamilton, MD;  Location: [facility];  Service: Cardiovascular;  Laterality: [name];   CYSTOSCOPY  12/23/2010   [facility]   CYSTOSCOPY WITH BIOPSY [name] 01/16/2024   Procedure: CYSTOSCOPY, WITH BIOPSY;  Surgeon: Geraline Knapp, MD;  Location: [facility];  Service: Urology;  Laterality: [name];   KNEE ARTHROSCOPY Right    x2   LAMINOTOMY  1986   L5/S1 lumbar laminotomy for two ruptured  discs/fusion   LUMBAR LAMINECTOMY/DECOMPRESSION MICRODISCECTOMY [name] 07/13/2016   Procedure: LUMBAR TWO-THREE, LUMBAR THREE-FOUR, LUMBAR FOUR-FIVE LAMINECTOMY AND FORAMINOTOMY;  Surgeon: Garry Kansas, MD;  Location: [facility];  Service: Neurosurgery;  Laterality: [name];  LAMINECTOMY AND FORAMINOTOMY L2-L3, L3-L4,L4-L5   LUMBAR SPINE SURGERY  06/2020   Bilateral redo laminectomy/laminotomy/foraminotomies/medial facetectomy to decompress the bilateral L4 and L5 nerve roots Larrie Po)   REVERSE SHOULDER ARTHROPLASTY Right 12/21/2023   Procedure: ARTHROPLASTY, SHOULDER, TOTAL, REVERSE;  Surgeon: Ellard Gunning, MD;  Location: [facility];  Service: Orthopedics;  Laterality: Right;   RIGHT/LEFT HEART CATH AND CORONARY ANGIOGRAPHY Bilateral 01/13/2023   Procedure: RIGHT/LEFT HEART CATH AND CORONARY ANGIOGRAPHY;  Surgeon: Wenona Hamilton, MD;  Location: [facility];  Service: Cardiovascular;  Laterality: Bilateral;   TOTAL HIP ARTHROPLASTY Bilateral 1999,2000   TOTAL KNEE ARTHROPLASTY Right 03/06/2017   Procedure: RIGHT TOTAL KNEE ARTHROPLASTY;  Surgeon: Liliane Rei, MD;  Location: [facility];  Service: Orthopedics;  Laterality: Right;   TRANSURETHRAL RESECTION OF BLADDER TUMOR [name] 10/19/2021   Procedure: TRANSURETHRAL RESECTION OF BLADDER TUMOR (TURBT);  Surgeon: Geraline Knapp, MD;  Location: [facility];  Service: Urology;  Laterality: [name];   TRANSURETHRAL RESECTION OF BLADDER TUMOR [name] 11/15/2022   Procedure: TRANSURETHRAL RESECTION OF BLADDER TUMOR (TURBT);  Surgeon: Geraline Knapp, MD;  Location: [facility];  Service: Urology;  Laterality: [name];       Scheduled Meds:  aspirin  EC  81 mg Oral Daily   atorvastatin   20 mg Oral Daily   carvedilol   3.125 mg Oral BID WC   enoxaparin  (LOVENOX ) injection  40 mg Subcutaneous Q24H   [START ON 01/18/2024] ezetimibe   10 mg Oral Daily   gabapentin   300 mg Oral BID   insulin  aspart  0-5 Units Subcutaneous QHS   insulin  aspart  0-9 Units Subcutaneous TID WC    [START ON 01/18/2024] isosorbide  mononitrate  30 mg Oral Daily   [START ON 01/18/2024] losartan   25 mg Oral Daily   [START ON 01/18/2024] montelukast   10 mg Oral Daily   ranolazine   500 mg Oral BID   [START ON 01/18/2024] spironolactone   12.5 mg Oral Daily   tamsulosin   0.4 mg Oral QHS   Continuous Infusions:  PRN Meds: acetaminophen  **OR** acetaminophen , albuterol , bisacodyl , furosemide , hydrALAZINE, oxyCODONE , polyethylene glycol  Allergies:    Allergies  Allergen Reactions   Vioxx [Rofecoxib] Other (See Comments)    Hemorrhage    Oxycodone  Other (See Comments)    Could not wake up , felt drunk   Spiriva  Respimat [Tiotropium Bromide Monohydrate ] Other (See Comments)    Elevated bp   Contrast Media [Iodinated Contrast Media] Itching and Rash    Delayed reaction post abdominal aortagram.    Morphine  Nausea Only and Other (See Comments)    Irritability     Social History:   Social History   Socioeconomic History   Marital status: Married    Spouse name: Haskell Linker   Number of children: 1   Years of education: Not on file   Highest education level: Never attended school  Occupational History   Not on file  Tobacco Use   Smoking status: Former    Current packs/day: 0.00    Average packs/day: 1 pack/day for 25.0 years (25.0 ttl pk-yrs)    Types: Cigarettes    Start date: 08/16/1963    Quit date: 08/15/1988    Years since quitting: 35.4    Passive exposure: Past   Smokeless tobacco: Never  Vaping Use   Vaping status: Never Used  Substance and Sexual Activity   Alcohol use: No    Alcohol/week: 0.0 standard drinks of alcohol   Drug use: No   Sexual activity: Yes  Other Topics Concern   Not on file  Social History Narrative   Married, wife Haskell Linker, for 25 years in 2016. 1 daughter and 3 grandkids from first marriage.    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    Activity: part time farmer, raise goats and chickens. Mows yard.  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    Diet: good water , fruits/vegetables daily      OSA eval recommended while hospitalized with Covid 05/2019   Social Drivers of Health   Financial Resource Strain: Low Risk  (07/31/2023)   Overall Financial Resource Strain (CARDIA)    Difficulty of Paying Living Expenses: Not hard at all  Food Insecurity: No Food Insecurity (07/31/2023)   Hunger Vital Sign    Worried About Running Out of Food in the Last Year: Never true    Ran Out of Food in the Last Year: Never true  Transportation Needs: No Transportation Needs (07/31/2023)   PRAPARE - Administrator, Civil Service (Medical): No    Lack of Transportation (Non-Medical): No  Physical Activity: Inactive (07/31/2023)   Exercise Vital Sign    Days of Exercise per Week: 0 days    Minutes of Exercise per Session: 10 min  Stress: No Stress Concern Present (05/25/2023)   [employer] of Occupational Health - Occupational Stress Questionnaire    Feeling of Stress : Not at all  Social Connections: Socially Integrated (07/31/2023)   Social Connection and Isolation Panel [NHANES]    Frequency of Communication with Friends and Family: More than three times a week    Frequency of Social Gatherings with Friends and Family: Once a week    Attends Religious Services: 1 to 4 times per year    Active Member of [employer] or Organizations: Yes    Attends Banker Meetings: Never    Marital Status: Married  Catering manager Violence: Not At Risk (02/21/2023)   Humiliation, Afraid, Rape, and Kick questionnaire    Fear of Current or Ex-Partner: No    Emotionally Abused: No    Physically Abused: No    Sexually Abused: No  Recent Concern: Intimate Partner Violence - At Risk (01/13/2023)   Humiliation, Afraid, Rape, and Kick questionnaire    Fear of Current or Ex-Partner: No    Emotionally Abused: Yes    Physically Abused: Not on file    Sexually Abused: No    Family History:    Family History  Problem  Relation Age of Onset   Stroke Mother    Heart attack Mother    Diabetes Mother    Stroke Father    Heart disease Father    Polycystic kidney disease Father    Cancer Sister        throat   Polycystic kidney disease Sister    Diabetes Brother        1/2 brother   Cancer Other        5/7 nephews with lung cancer     ROS:  Please see the history of present illness.   Physical Exam/Data: Vitals:   01/17/24 1153 01/17/24 1318 01/17/24 1400 01/17/24 1445  BP: 127/88   (!) 160/62  Pulse: 74  69 65  Resp: 18  19 15   Temp: 97.9 F (36.6 C)     TempSrc: Oral     SpO2: 98%  94% 97%  Weight:  96.3 kg    Height:  5' 11.75" (1.822 m)     No intake or output data in the 24 hours ending 01/17/24 1608    01/17/2024    1:18 PM 01/10/2024   10:17 AM 01/10/2024    9:22 AM  Last 3 Weights  Weight (lbs) 212 lb 4.9 oz 212 lb 6 oz 224 lb 6 oz  Weight (kg) 96.3  kg 96.333 kg 101.776 kg     Body mass index is 28.99 kg/m.  General:  Well nourished, well developed, in no acute distress HEENT: normal Neck: no JVD Vascular: No carotid bruits; Distal pulses 2+ bilaterally Cardiac:  normal S1, S2; RRR; no murmur  Lungs:  clear to auscultation bilaterally, no wheezing, rhonchi or rales  Abd: soft, nontender, no hepatomegaly  Ext: no edema Skin: warm and dry  Psych:  Normal affect   EKG:  The EKG was personally reviewed and demonstrates:  sinus rhythm with LAFB, no acute ischemic changes Telemetry:  Telemetry was personally reviewed and demonstrates:  sinus rhythm with PVCs  Relevant CV Studies:  09/05/2023 Echo complete 1. Left ventricular ejection fraction, by estimation, is 40 to 45%. The  left ventricle has mildly decreased function. The left ventricle  demonstrates global hypokinesis. Left ventricular diastolic parameters are  consistent with Grade I diastolic  dysfunction (impaired relaxation). The global longitudinal strain is  abnormal.   2. Right ventricular systolic function is  normal. The right ventricular  size is normal.   3. Left atrial size was mildly dilated.   4. The mitral valve is normal in structure. Mild mitral valve  regurgitation. No evidence of mitral stenosis.   5. The aortic valve is normal in structure. Aortic valve regurgitation is  not visualized. Aortic valve sclerosis is present, with no evidence of  aortic valve stenosis.   6. The inferior vena cava is normal in size with greater than 50%  respiratory variability, suggesting right atrial pressure of 3 mmHg.   Laboratory Data: High Sensitivity Troponin:   Recent Labs  Lab 01/17/24 1156 01/17/24 1444  TROPONINIHS 16 18*     Chemistry Recent Labs  Lab 01/17/24 1156  NA 138  K 4.2  CL 106  CO2 22  GLUCOSE 124*  BUN 32*  CREATININE 1.42*  CALCIUM  8.8*  GFRNONAA 49*  ANIONGAP 10    No results for input(s): "PROT", "ALBUMIN ", "AST", "ALT", "ALKPHOS", "BILITOT" in the last 168 hours. Lipids No results for input(s): "CHOL", "TRIG", "HDL", "LABVLDL", "LDLCALC", "CHOLHDL" in the last 168 hours.  Hematology Recent Labs  Lab 01/17/24 1156  WBC 8.1  RBC 3.54*  HGB 11.4*  HCT 34.5*  MCV 97.5  MCH 32.2  MCHC 33.0  RDW 12.7  PLT 191   Thyroid  No results for input(s): "TSH", "FREET4" in the last 168 hours.  BNP Recent Labs  Lab 01/17/24 1156  BNP 535.6*    DDimer No results for input(s): "DDIMER" in the last 168 hours.  Radiology/Studies:  DG Chest 2 View Result Date: 01/17/2024 IMPRESSION: No active cardiopulmonary disease. Electronically Signed   By: Rosalene Colon M.D.   On: 01/17/2024 12:28   Assessment and Plan:  Chest pain Shortness of breath CAD s/p CABG and multiple PCIs - One day history of intermittent chest tightness with associated shortness of breath, not consistent with prior heart attack symptoms - Troponin negative x1, continue to trend - EKG without acute ischemic changes - Given history of DVT, two recent surgeries, and cancer, would recommend  evaluation for PE with CTA - Echo ordered - If patient is negative for PE, will plan to do ischemic evaluation tomorrow with Lexiscan  MPI, NPO after midnight - Recommend initiation of IV heparin  - Continue PTA ASA, statin, carvedilol , ezetimibe , isosorbide  mononitrate, and ranolazine   HFmrEF - Most recent echo 08/2023 with EF 40-45% - Appears euvolemic on exam - CXR without acute process - BNP mildly elevated  at 535 - Recommend net even fluid balance for now - Kidney function appears near baseline - Repeat echo as above - Continue PTA carvedilol , furosemide , losartan , and spironolactone   Hypertension - Continue carvedilol , isosorbide  mononitrate, and losartan  as above  Hyperlipidemia - Continue statin and ezetimibe  as above  For questions or updates, please contact [facility] Please consult [email or web address] for contact info under    Signed, Brodie Cannon, PA-C  01/17/2024 4:08 PM

## 2024-01-17 NOTE — Progress Notes (Signed)
 IV team RN placed a 20g IV in Left FA. Pt placed on heparin  gtt. Notified IV team RN and requested a 2nd site for use with CTA due later this evening. IV team RN Helene Loader RN stated that it should be ok to disconnect IV for CT scan and then reconnect. Messaged Dr Gwynneth Lessen via secure chat and MD stated it was ok to stop and reconnect after CT scan. Verified with [place] in CT that they were able to do so.

## 2024-01-18 ENCOUNTER — Observation Stay

## 2024-01-18 DIAGNOSIS — I200 Unstable angina: Secondary | ICD-10-CM | POA: Diagnosis not present

## 2024-01-18 LAB — BASIC METABOLIC PANEL WITH GFR
Anion gap: 9 (ref 5–15)
BUN: 38 mg/dL — ABNORMAL HIGH (ref 8–23)
CO2: 25 mmol/L (ref 22–32)
Calcium: 8.7 mg/dL — ABNORMAL LOW (ref 8.9–10.3)
Chloride: 105 mmol/L (ref 98–111)
Creatinine, Ser: 1.5 mg/dL — ABNORMAL HIGH (ref 0.61–1.24)
GFR, Estimated: 46 mL/min — ABNORMAL LOW (ref >60)
Glucose, Bld: 161 mg/dL — ABNORMAL HIGH (ref 70–99)
Potassium: 4.6 mmol/L (ref 3.5–5.1)
Sodium: 139 mmol/L (ref 135–145)

## 2024-01-18 LAB — NM MYOCAR MULTI W/SPECT W/WALL MOTION / EF
Estimated workload: 1
Exercise duration (min): 0 min
Exercise duration (sec): 0 s
LV dias vol: 159 mL (ref 62–150)
LV sys vol: 89 mL
MPHR: 138 {beats}/min
Nuc Stress EF: 44 %
Peak HR: 100 {beats}/min
Percent HR: 72 %
Rest HR: 58 {beats}/min
Rest Nuclear Isotope Dose: 10.4 mCi
SDS: 6
SRS: 12
SSS: 8
ST Depression (mm): 0 mm
Stress Nuclear Isotope Dose: 30.5 mCi
TID: 1.01

## 2024-01-18 LAB — ECHOCARDIOGRAM COMPLETE
AR max vel: 1.34 cm2
AV Area VTI: 1.16 cm2
AV Area mean vel: 1.25 cm2
AV Mean grad: 11.2 mmHg
AV Peak grad: 19.5 mmHg
Ao pk vel: 2.21 m/s
Area-P 1/2: 4.06 cm2
Height: 71.75 in
S' Lateral: 4 cm
Weight: 3396.85 [oz_av]

## 2024-01-18 LAB — CBC
HCT: 34.4 % — ABNORMAL LOW (ref 39.0–52.0)
Hemoglobin: 11.3 g/dL — ABNORMAL LOW (ref 13.0–17.0)
MCH: 31.7 pg (ref 26.0–34.0)
MCHC: 32.8 g/dL (ref 30.0–36.0)
MCV: 96.6 fL (ref 80.0–100.0)
Platelets: 182 10*3/uL (ref 150–400)
RBC: 3.56 MIL/uL — ABNORMAL LOW (ref 4.22–5.81)
RDW: 12.6 % (ref 11.5–15.5)
WBC: 6.3 10*3/uL (ref 4.0–10.5)
nRBC: 0 % (ref 0.0–0.2)

## 2024-01-18 LAB — GLUCOSE, CAPILLARY
Glucose-Capillary: 134 mg/dL — ABNORMAL HIGH (ref 70–99)
Glucose-Capillary: 135 mg/dL — ABNORMAL HIGH (ref 70–99)
Glucose-Capillary: 172 mg/dL — ABNORMAL HIGH (ref 70–99)
Glucose-Capillary: 111 mg/dL — ABNORMAL HIGH (ref 70–99)

## 2024-01-18 LAB — HEPARIN LEVEL (UNFRACTIONATED)
Heparin Unfractionated: 0.38 [IU]/mL (ref 0.30–0.70)
Heparin Unfractionated: 0.29 [IU]/mL — ABNORMAL LOW (ref 0.30–0.70)

## 2024-01-18 MED ORDER — TECHNETIUM TC 99M TETROFOSMIN IV KIT
30.0000 | PACK | Freq: Once | INTRAVENOUS | Status: AC | PRN
  Administered 2024-01-18: 30.54 via INTRAVENOUS

## 2024-01-18 MED ORDER — LOSARTAN POTASSIUM 50 MG PO TABS
50.0000 mg | ORAL_TABLET | Freq: Every day | ORAL | Status: DC
  Administered 2024-01-19: 50 mg via ORAL
  Filled 2024-01-18: qty 1

## 2024-01-18 MED ORDER — ASPIRIN 81 MG PO CHEW
81.0000 mg | CHEWABLE_TABLET | ORAL | Status: AC
  Administered 2024-01-19: 81 mg via ORAL
  Filled 2024-01-18: qty 1

## 2024-01-18 MED ORDER — PREDNISONE 50 MG PO TABS: 50.0000 mg | ORAL_TABLET | Freq: Four times a day (QID) | ORAL | Status: DC

## 2024-01-18 MED ORDER — REGADENOSON 0.4 MG/5ML IV SOLN
0.4000 mg | Freq: Once | INTRAVENOUS | Status: AC
  Administered 2024-01-18: 0.4 mg via INTRAVENOUS

## 2024-01-18 MED ORDER — DIPHENHYDRAMINE HCL 50 MG/ML IJ SOLN: 50.0000 mg | Freq: Once | INTRAMUSCULAR | Status: AC

## 2024-01-18 MED ORDER — GABAPENTIN 300 MG PO CAPS
300.0000 mg | ORAL_CAPSULE | Freq: Once | ORAL | Status: AC
  Administered 2024-01-18: 300 mg via ORAL
  Filled 2024-01-18: qty 1

## 2024-01-18 MED ORDER — DIPHENHYDRAMINE HCL 25 MG PO CAPS: 50.0000 mg | ORAL_CAPSULE | Freq: Once | ORAL | Status: DC

## 2024-01-18 MED ORDER — HEPARIN BOLUS VIA INFUSION
1450.0000 [IU] | Freq: Once | INTRAVENOUS | Status: AC
  Administered 2024-01-18: 1450 [IU] via INTRAVENOUS
  Filled 2024-01-18: qty 1450

## 2024-01-18 MED ORDER — DIPHENHYDRAMINE HCL 50 MG/ML IJ SOLN: 50.0000 mg | Freq: Once | INTRAMUSCULAR | Status: DC

## 2024-01-18 MED ORDER — BUDESON-GLYCOPYRROL-FORMOTEROL 160-9-4.8 MCG/ACT IN AERO
2.0000 | INHALATION_SPRAY | Freq: Two times a day (BID) | RESPIRATORY_TRACT | Status: DC
  Administered 2024-01-18: 2 via RESPIRATORY_TRACT
  Filled 2024-01-18: qty 5.9

## 2024-01-18 MED ORDER — SODIUM CHLORIDE 0.9 % WEIGHT BASED INFUSION
1.0000 mL/kg/h | INTRAVENOUS | Status: DC
  Administered 2024-01-18 – 2024-01-19 (×2): 1 mL/kg/h via INTRAVENOUS

## 2024-01-18 MED ORDER — TECHNETIUM TC 99M TETROFOSMIN IV KIT
10.4300 | PACK | Freq: Once | INTRAVENOUS | Status: AC | PRN
  Administered 2024-01-18: 10.43 via INTRAVENOUS

## 2024-01-18 MED ORDER — DIPHENHYDRAMINE HCL 25 MG PO CAPS
50.0000 mg | ORAL_CAPSULE | Freq: Once | ORAL | Status: AC
  Administered 2024-01-19: 50 mg via ORAL
  Filled 2024-01-18: qty 2

## 2024-01-18 MED ORDER — PREDNISONE 50 MG PO TABS
50.0000 mg | ORAL_TABLET | Freq: Four times a day (QID) | ORAL | Status: DC
  Administered 2024-01-19: 50 mg via ORAL
  Filled 2024-01-18: qty 1

## 2024-01-18 NOTE — H&P (View-Only) (Signed)
 Cardiology Progress Note   Patient Name: [name] Date of Encounter: 01/18/2024  Primary Cardiologist: Belva Boyden, MD  Subjective   No chest pain or dyspnea. For stress testing today. Objective   Inpatient Medications    Scheduled Meds:  aspirin  EC  81 mg Oral Daily   atorvastatin   20 mg Oral Daily   carvedilol   3.125 mg Oral BID WC   ezetimibe   10 mg Oral Daily   gabapentin   300 mg Oral BID   insulin  aspart  0-5 Units Subcutaneous QHS   insulin  aspart  0-9 Units Subcutaneous TID WC   isosorbide  mononitrate  30 mg Oral Daily   losartan   25 mg Oral Daily   montelukast   10 mg Oral Daily   ranolazine   500 mg Oral BID   spironolactone   12.5 mg Oral Daily   tamsulosin   0.4 mg Oral QHS   Continuous Infusions:  heparin  1,250 Units/hr (01/17/24 1824)   PRN Meds: acetaminophen  **OR** acetaminophen , albuterol , bisacodyl , furosemide , hydrALAZINE, oxyCODONE , polyethylene glycol   Vital Signs    Vitals:   01/17/24 2001 01/18/24 0014 01/18/24 0456 01/18/24 0800  BP: (!) 136/50 (!) 146/57 (!) 136/59 (!) 148/59  Pulse: 61 66 72 69  Resp: 18  20 20   Temp: (!) 97.4 F (36.3 C) 98.5 F (36.9 C) 97.9 F (36.6 C) 97.8 F (36.6 C)  TempSrc: Oral Oral Oral   SpO2: 98% 96% 98% 93%  Weight:      Height:        Intake/Output Summary (Last 24 hours) at 01/18/2024 1114 Last data filed at 01/18/2024 1004 Gross per 24 hour  Intake 240 ml  Output 2050 ml  Net -1810 ml   Filed Weights   01/17/24 1318  Weight: 96.3 kg    Physical Exam   GEN: Well nourished, well developed, in no acute distress.  HEENT: Grossly normal.  Neck: Supple, no JVD, carotid bruits, or masses. Cardiac: RRR, no murmurs, rubs, or gallops. No clubbing, cyanosis, edema.  Radials 2+, DP/PT 2+ and equal bilaterally.  Respiratory:  Respirations regular and unlabored, clear to auscultation bilaterally. GI: Soft, nontender, nondistended, BS + x 4. MS: no deformity or atrophy. Skin: warm and dry, no  rash. Neuro:  Strength and sensation are intact. Psych: AAOx3.  Normal affect.  Labs    Chemistry Recent Labs  Lab 01/17/24 1156 01/18/24 0217  NA 138 139  K 4.2 4.6  CL 106 105  CO2 22 25  GLUCOSE 124* 161*  BUN 32* 38*  CREATININE 1.42* 1.50*  CALCIUM  8.8* 8.7*  GFRNONAA 49* 46*  ANIONGAP 10 9     Hematology Recent Labs  Lab 01/17/24 1156 01/18/24 0217  WBC 8.1 6.3  RBC 3.54* 3.56*  HGB 11.4* 11.3*  HCT 34.5* 34.4*  MCV 97.5 96.6  MCH 32.2 31.7  MCHC 33.0 32.8  RDW 12.7 12.6  PLT 191 182    Cardiac Enzymes  Recent Labs  Lab 01/17/24 1156 01/17/24 1444  TROPONINIHS 16 18*      BNP    Component Value Date/Time   BNP 535.6 (H) 01/17/2024 1156    ProBNP    Component Value Date/Time   PROBNP 329.0 (H) 12/28/2023 1601    Lipids  Lab Results  Component Value Date   CHOL 161 08/11/2023   HDL 41 08/11/2023   LDLCALC 96 08/11/2023   LDLDIRECT 106.0 01/01/2016   TRIG 132 08/11/2023   CHOLHDL 3.9 08/11/2023    HbA1c  Lab  Results  Component Value Date   HGBA1C 6.0 (H) 12/13/2023    Radiology    CT Angio Chest Pulmonary Embolism (PE) W or WO Contrast Result Date: 01/17/2024 CLINICAL DATA:  Chest pain and shortness of breath. EXAM: CT ANGIOGRAPHY CHEST WITH CONTRAST TECHNIQUE: Multidetector CT imaging of the chest was performed using the standard protocol during bolus administration of intravenous contrast. Multiplanar CT image reconstructions and MIPs were obtained to evaluate the vascular anatomy. RADIATION DOSE REDUCTION: This exam was performed according to the departmental dose-optimization program which includes automated exposure control, adjustment of the mA and/or kV according to patient size and/or use of iterative reconstruction technique. CONTRAST:  75mL OMNIPAQUE  IOHEXOL  350 MG/ML SOLN COMPARISON:  Radiograph earlier today.  Chest CT 04/20/2021 FINDINGS: Cardiovascular: There are no filling defects within the pulmonary arteries to suggest  pulmonary embolus. The heart is mildly enlarged. Aortic atherosclerosis. There is no contrast in the aorta. No pericardial effusion. Status post CABG with calcification of native coronary arteries. Mediastinum/Nodes: No suspicious mediastinal or hilar lymphadenopathy. Unremarkable appearance of the esophagus. No thyroid  nodule. Lungs/Pleura: Emphysema with bronchial thickening. Patchy areas of ground-glass opacity within the lower lobes, right middle lobe and lingula. This is slightly more prominent than on prior exam. No confluent airspace disease. No pleural fluid. Upper Abdomen: No acute upper abdominal findings. Musculoskeletal: Reverse right shoulder arthroplasty. Median sternotomy. Review of the MIP images confirms the above findings. IMPRESSION: 1. No pulmonary embolus. 2. Patchy areas of ground-glass opacity within the lower lobes, right middle lobe and lingula, slightly more prominent than on prior exam. This is nonspecific, but can be seen with atypical infection or inflammation. This is likely superimposed on scarring. Aortic Atherosclerosis (ICD10-I70.0) and Emphysema (ICD10-J43.9). Electronically Signed   By: Chadwick Colonel M.D.   On: 01/17/2024 22:28   DG Chest 2 View Result Date: 01/17/2024 CLINICAL DATA:  Chest pain. EXAM: CHEST - 2 VIEW COMPARISON:  October 24, 2023. FINDINGS: The heart size and mediastinal contours are within normal limits. Both lungs are clear. Status post coronary artery bypass graft and right shoulder arthroplasty. IMPRESSION: No active cardiopulmonary disease. Electronically Signed   By: Rosalene Colon M.D.   On: 01/17/2024 12:28     Telemetry    RSR, PVCs, couplets - Personally Reviewed  Cardiac Studies   2D Echocardiogram 6.4.2025  1. Left ventricular ejection fraction, by estimation, is 50 to 55%. The  left ventricle has low normal function. The left ventricle has no regional  wall motion abnormalities. The left ventricular internal cavity size was  mildly  dilated. Left ventricular  diastolic parameters were normal.   2. Right ventricular systolic function is normal. The right ventricular  size is normal. Tricuspid regurgitation signal is inadequate for assessing  PA pressure.   3. Left atrial size was mildly dilated.   4. The mitral valve is normal in structure. Mild mitral valve  regurgitation. No evidence of mitral stenosis.   5. The aortic valve is calcified. Aortic valve regurgitation is not  visualized. Mild to moderate aortic valve stenosis. Aortic valve area, by  VTI measures 1.16 cm. Aortic valve mean gradient measures 11.2 mmHg.  _____________   Patient Profile     83 y.o. male w/ a h/o CAD s/p CABG w/ multiple subsequent PCIs, HFrEF, ICM, PAD, HTN, HL, DMII, bladder CA s/p TURBT 01/16/2024, VTE, DJD s/p R shoulder replacement, and COPD, who was admitted 6/4 with chest pain (hsTrop 18).  Assessment & Plan    1.  CAD/Precordial pain:  Admitted 6/4 w/ atypical chest pain - intermittent, sometimes sharp, sometimes tight - different from prior angina.  HsTrop 18.  Echo w/ low-nl EF of 50-55% w/o rewma, mild MR, mild-,mod AS.  CTA chest neg for PE.  No c/p overnight. Plan for myoview  this AM.  Cont asa, statin, ? blocker, zetia , heparin , nitrate, ARB, and ranolazine .  2.  Chronic HFrEF/ICM: EF prev as low as 35-40%.  EF 50-55% by echo this admission.  Euvolemic on exam.  Minus 1.2 L yesterday.  Cont ? blocker, ARB, spiro.  Consider sglt2i.  3.  HTN:  Pressures trending high.  Will increase losartan  to 50 mg.  4.  HL:  Cont statin/zetia .  LDL 96 in 07/2023.  Consider psck9i.  5.  DMII:  A1c 6.0.  Per IM.  Consider sglt2i in setting of #2.  Signed, Laneta Pintos, NP  01/18/2024, 11:14 AM    For questions or updates, please contact   Please consult [email or web address] for contact info under Cardiology/STEMI.

## 2024-01-18 NOTE — Care Management Obs Status (Signed)
 Third attempt to get [name] signed, patient out of room for stress test.

## 2024-01-18 NOTE — Progress Notes (Signed)
 Cardiology Progress Note   Patient Name: [name] Date of Encounter: 01/18/2024  Primary Cardiologist: Belva Boyden, MD  Subjective   No chest pain or dyspnea. For stress testing today. Objective   Inpatient Medications    Scheduled Meds:  aspirin  EC  81 mg Oral Daily   atorvastatin   20 mg Oral Daily   carvedilol   3.125 mg Oral BID WC   ezetimibe   10 mg Oral Daily   gabapentin   300 mg Oral BID   insulin  aspart  0-5 Units Subcutaneous QHS   insulin  aspart  0-9 Units Subcutaneous TID WC   isosorbide  mononitrate  30 mg Oral Daily   losartan   25 mg Oral Daily   montelukast   10 mg Oral Daily   ranolazine   500 mg Oral BID   spironolactone   12.5 mg Oral Daily   tamsulosin   0.4 mg Oral QHS   Continuous Infusions:  heparin  1,250 Units/hr (01/17/24 1824)   PRN Meds: acetaminophen  **OR** acetaminophen , albuterol , bisacodyl , furosemide , hydrALAZINE, oxyCODONE , polyethylene glycol   Vital Signs    Vitals:   01/17/24 2001 01/18/24 0014 01/18/24 0456 01/18/24 0800  BP: (!) 136/50 (!) 146/57 (!) 136/59 (!) 148/59  Pulse: 61 66 72 69  Resp: 18  20 20   Temp: (!) 97.4 F (36.3 C) 98.5 F (36.9 C) 97.9 F (36.6 C) 97.8 F (36.6 C)  TempSrc: Oral Oral Oral   SpO2: 98% 96% 98% 93%  Weight:      Height:        Intake/Output Summary (Last 24 hours) at 01/18/2024 1114 Last data filed at 01/18/2024 1004 Gross per 24 hour  Intake 240 ml  Output 2050 ml  Net -1810 ml   Filed Weights   01/17/24 1318  Weight: 96.3 kg    Physical Exam   GEN: Well nourished, well developed, in no acute distress.  HEENT: Grossly normal.  Neck: Supple, no JVD, carotid bruits, or masses. Cardiac: RRR, no murmurs, rubs, or gallops. No clubbing, cyanosis, edema.  Radials 2+, DP/PT 2+ and equal bilaterally.  Respiratory:  Respirations regular and unlabored, clear to auscultation bilaterally. GI: Soft, nontender, nondistended, BS + x 4. MS: no deformity or atrophy. Skin: warm and dry, no  rash. Neuro:  Strength and sensation are intact. Psych: AAOx3.  Normal affect.  Labs    Chemistry Recent Labs  Lab 01/17/24 1156 01/18/24 0217  NA 138 139  K 4.2 4.6  CL 106 105  CO2 22 25  GLUCOSE 124* 161*  BUN 32* 38*  CREATININE 1.42* 1.50*  CALCIUM  8.8* 8.7*  GFRNONAA 49* 46*  ANIONGAP 10 9     Hematology Recent Labs  Lab 01/17/24 1156 01/18/24 0217  WBC 8.1 6.3  RBC 3.54* 3.56*  HGB 11.4* 11.3*  HCT 34.5* 34.4*  MCV 97.5 96.6  MCH 32.2 31.7  MCHC 33.0 32.8  RDW 12.7 12.6  PLT 191 182    Cardiac Enzymes  Recent Labs  Lab 01/17/24 1156 01/17/24 1444  TROPONINIHS 16 18*      BNP    Component Value Date/Time   BNP 535.6 (H) 01/17/2024 1156    ProBNP    Component Value Date/Time   PROBNP 329.0 (H) 12/28/2023 1601    Lipids  Lab Results  Component Value Date   CHOL 161 08/11/2023   HDL 41 08/11/2023   LDLCALC 96 08/11/2023   LDLDIRECT 106.0 01/01/2016   TRIG 132 08/11/2023   CHOLHDL 3.9 08/11/2023    HbA1c  Lab  Results  Component Value Date   HGBA1C 6.0 (H) 12/13/2023    Radiology    CT Angio Chest Pulmonary Embolism (PE) W or WO Contrast Result Date: 01/17/2024 CLINICAL DATA:  Chest pain and shortness of breath. EXAM: CT ANGIOGRAPHY CHEST WITH CONTRAST TECHNIQUE: Multidetector CT imaging of the chest was performed using the standard protocol during bolus administration of intravenous contrast. Multiplanar CT image reconstructions and MIPs were obtained to evaluate the vascular anatomy. RADIATION DOSE REDUCTION: This exam was performed according to the departmental dose-optimization program which includes automated exposure control, adjustment of the mA and/or kV according to patient size and/or use of iterative reconstruction technique. CONTRAST:  75mL OMNIPAQUE  IOHEXOL  350 MG/ML SOLN COMPARISON:  Radiograph earlier today.  Chest CT 04/20/2021 FINDINGS: Cardiovascular: There are no filling defects within the pulmonary arteries to suggest  pulmonary embolus. The heart is mildly enlarged. Aortic atherosclerosis. There is no contrast in the aorta. No pericardial effusion. Status post CABG with calcification of native coronary arteries. Mediastinum/Nodes: No suspicious mediastinal or hilar lymphadenopathy. Unremarkable appearance of the esophagus. No thyroid  nodule. Lungs/Pleura: Emphysema with bronchial thickening. Patchy areas of ground-glass opacity within the lower lobes, right middle lobe and lingula. This is slightly more prominent than on prior exam. No confluent airspace disease. No pleural fluid. Upper Abdomen: No acute upper abdominal findings. Musculoskeletal: Reverse right shoulder arthroplasty. Median sternotomy. Review of the MIP images confirms the above findings. IMPRESSION: 1. No pulmonary embolus. 2. Patchy areas of ground-glass opacity within the lower lobes, right middle lobe and lingula, slightly more prominent than on prior exam. This is nonspecific, but can be seen with atypical infection or inflammation. This is likely superimposed on scarring. Aortic Atherosclerosis (ICD10-I70.0) and Emphysema (ICD10-J43.9). Electronically Signed   By: Chadwick Colonel M.D.   On: 01/17/2024 22:28   DG Chest 2 View Result Date: 01/17/2024 CLINICAL DATA:  Chest pain. EXAM: CHEST - 2 VIEW COMPARISON:  October 24, 2023. FINDINGS: The heart size and mediastinal contours are within normal limits. Both lungs are clear. Status post coronary artery bypass graft and right shoulder arthroplasty. IMPRESSION: No active cardiopulmonary disease. Electronically Signed   By: Rosalene Colon M.D.   On: 01/17/2024 12:28     Telemetry    RSR, PVCs, couplets - Personally Reviewed  Cardiac Studies   2D Echocardiogram 6.4.2025  1. Left ventricular ejection fraction, by estimation, is 50 to 55%. The  left ventricle has low normal function. The left ventricle has no regional  wall motion abnormalities. The left ventricular internal cavity size was  mildly  dilated. Left ventricular  diastolic parameters were normal.   2. Right ventricular systolic function is normal. The right ventricular  size is normal. Tricuspid regurgitation signal is inadequate for assessing  PA pressure.   3. Left atrial size was mildly dilated.   4. The mitral valve is normal in structure. Mild mitral valve  regurgitation. No evidence of mitral stenosis.   5. The aortic valve is calcified. Aortic valve regurgitation is not  visualized. Mild to moderate aortic valve stenosis. Aortic valve area, by  VTI measures 1.16 cm. Aortic valve mean gradient measures 11.2 mmHg.  _____________   Patient Profile     83 y.o. male w/ a h/o CAD s/p CABG w/ multiple subsequent PCIs, HFrEF, ICM, PAD, HTN, HL, DMII, bladder CA s/p TURBT 01/16/2024, VTE, DJD s/p R shoulder replacement, and COPD, who was admitted 6/4 with chest pain (hsTrop 18).  Assessment & Plan    1.  CAD/Precordial pain:  Admitted 6/4 w/ atypical chest pain - intermittent, sometimes sharp, sometimes tight - different from prior angina.  HsTrop 18.  Echo w/ low-nl EF of 50-55% w/o rewma, mild MR, mild-,mod AS.  CTA chest neg for PE.  No c/p overnight. Plan for myoview  this AM.  Cont asa, statin, ? blocker, zetia , heparin , nitrate, ARB, and ranolazine .  2.  Chronic HFrEF/ICM: EF prev as low as 35-40%.  EF 50-55% by echo this admission.  Euvolemic on exam.  Minus 1.2 L yesterday.  Cont ? blocker, ARB, spiro.  Consider sglt2i.  3.  HTN:  Pressures trending high.  Will increase losartan  to 50 mg.  4.  HL:  Cont statin/zetia .  LDL 96 in 07/2023.  Consider psck9i.  5.  DMII:  A1c 6.0.  Per IM.  Consider sglt2i in setting of #2.  Signed, Laneta Pintos, NP  01/18/2024, 11:14 AM    For questions or updates, please contact   Please consult [email or web address] for contact info under Cardiology/STEMI.

## 2024-01-18 NOTE — Consult Note (Signed)
 Pharmacy Consult Note - Anticoagulation  Pharmacy Consult for heparin  Indication: chest pain/ACS  PATIENT MEASUREMENTS: Height: 5' 11.75" (182.2 cm) Weight: 96.3 kg (212 lb 4.9 oz) IBW/kg (Calculated) : 77.03 HEPARIN  DW (KG): 96.3  VITAL SIGNS: Temp: 97.6 F (36.4 C) (06/05 1545) Temp Source: Oral (06/05 0456) BP: 130/55 (06/05 1545) Pulse Rate: 69 (06/05 1545)  Recent Labs    01/17/24 1156 01/17/24 1444 01/17/24 1808 01/17/24 1812 01/18/24 0217 01/18/24 1538  HGB  --   --   --   --  11.3*  --   HCT  --   --   --   --  34.4*  --   PLT  --   --   --   --  182  --   APTT  --   --   --  23*  --   --   LABPROT  --   --  13.3  --   --   --   INR  --   --  1.0  --   --   --   HEPARINUNFRC   < >  --   --   --  0.38 0.29*  CREATININE  --   --   --   --  1.50*  --   TROPONINIHS  --  18*  --   --   --   --    < > = values in this interval not displayed.    Estimated Creatinine Clearance: 45.5 mL/min (A) (by C-G formula based on SCr of 1.5 mg/dL (H)).  PAST MEDICAL HISTORY: Past Medical History:  Diagnosis Date   Actinic keratosis    Anginal pain (HCC)    Anxiety    Aortic ectasia, abdominal (HCC)    a.) CT abd 01/01/2022: 2.9 cm infrarenal abdominal aorta   Aortic stenosis    Bladder cancer (HCC) 2023   BPH (benign prostatic hyperplasia) 09/06/2007   CAD s/p CABG    a.) 1990 s/p MI--> CABG x 3; b.) LHC/PCI 2002 --> BMS (unk type) to LCx   Carotid arterial disease (HCC)    a.) 08/2016 Carotid U/S: <39% bilat; b.) carotid doppler 10/12/2022: mild-to-moderate (<50%) bilateral bifurcation plaque.   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 06/2019 Echo (in setting of COVID): EF 35-40%, mild LVH, g1 DD, glob HK; b. 09/2019 Echo: EF 50-55%, Gr1 DD; c. 03/2021 Echo: EF 50-55%, no rwma, mild LVH, GrI DD, nl RV fxn. RVSP 44.60mmHg. Mild-mod dil LA. Triv MR. Mild-mod Ao sclerosis.   History of 2019 novel coronavirus disease (COVID-19) 05/20/2019   History of hiatal hernia    History of shingles    HLD (hyperlipidemia) 04/26/2007   HTN (hypertension) 04/30/2007   LBBB (left bundle branch block)    Long-term use of aspirin  therapy  Lumbar disc disease with radiculopathy    Migraines    Myocardial infarction (HCC) 1990   a.) resulted in 3v CABG   Myocardial infarction (HCC) 2002   a.) second cardiac event; PCI with placement of BMS (unknown type) to LCx   Neuropathy of both feet    OSA on CPAP    Osteoarthritis    PAD (peripheral artery disease) (HCC)    a. 07/2017 LE duplex: RSFA 75-87m, LSFA 75-50m, 50-74d; c. 08/2018 Periph Angio: No signif AoIliac dzs. Mod-sev Ca2+ RSFA w/ diff dzs throughout- 3 vessel runoff. Borderline signif LSFA dzs w/ mod-sev Ca2+ vessels and 3 vessel runoff below the knee-->Med rx.   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 male with PMH  including CAD s/p CABGx3, HTN, PAD, VTE is presenting with chest pain/ACS. Patient is not on chronic anticoagulation per chart review. Pharmacy has been consulted to initiate and manage heparin  intravenous infusion.  Pertinent medications: No chronic anticoagulation prior to admission per chart review  Goal(s) of therapy: Heparin  level 0.3 - 0.7 units/mL Monitor platelets by anticoagulation protocol: Yes   Baseline anticoagulation labs: Recent Labs    01/17/24 1156 01/17/24 1808 01/17/24 1812 01/18/24 0217  APTT  --   --  23*  --   INR  --  1.0  --   --   HGB 11.4*  --   --  11.3*  PLT 191  --   --  182    Date Time aPTT/HL Rate/Comment 06/05   0217   0.38                 Therapeutic x 1  0605    1538   0.29                 Subtherapeutic   PLAN: HL subtherapeutic Give 1450 unit bolus x 1 and increase heparin  infusion to 1450 units/hr  Recheck HL 8 hours after rate adjustment  CBC stable; monitor daily while on heparin  infusion  Judine Arciniega, PharmD Pharmacy Resident  01/18/2024 4:13 PM

## 2024-01-18 NOTE — Plan of Care (Signed)
  Problem: Coping: Goal: Ability to adjust to condition or change in health will improve Outcome: Progressing   Problem: Fluid Volume: Goal: Ability to maintain a balanced intake and output will improve Outcome: Progressing   Problem: Health Behavior/Discharge Planning: Goal: Ability to identify and utilize available resources and services will improve Outcome: Progressing Goal: Ability to manage health-related needs will improve Outcome: Progressing   Problem: Metabolic: Goal: Ability to maintain appropriate glucose levels will improve Outcome: Progressing   Problem: Nutritional: Goal: Maintenance of adequate nutrition will improve Outcome: Progressing Goal: Progress toward achieving an optimal weight will improve Outcome: Progressing   Problem: Skin Integrity: Goal: Risk for impaired skin integrity will decrease Outcome: Progressing   Problem: Tissue Perfusion: Goal: Adequacy of tissue perfusion will improve Outcome: Progressing   Problem: Education: Goal: Knowledge of General Education information will improve Description: Including pain rating scale, medication(s)/side effects and non-pharmacologic comfort measures Outcome: Progressing   Problem: Health Behavior/Discharge Planning: Goal: Ability to manage health-related needs will improve Outcome: Progressing   Problem: Clinical Measurements: Goal: Cardiovascular complication will be avoided Outcome: Progressing   Problem: Activity: Goal: Risk for activity intolerance will decrease Outcome: Progressing   Problem: Nutrition: Goal: Adequate nutrition will be maintained Outcome: Progressing   Problem: Coping: Goal: Level of anxiety will decrease Outcome: Progressing   Problem: Elimination: Goal: Will not experience complications related to bowel motility Outcome: Progressing Goal: Will not experience complications related to urinary retention Outcome: Progressing   Problem: Pain Managment: Goal:  General experience of comfort will improve and/or be controlled Outcome: Progressing   Problem: Safety: Goal: Ability to remain free from injury will improve Outcome: Progressing   Problem: Skin Integrity: Goal: Risk for impaired skin integrity will decrease Outcome: Progressing

## 2024-01-18 NOTE — Care Management Obs Status (Signed)
 MEDICARE OBSERVATION STATUS NOTIFICATION   Patient Details  Name: [name] MRN: [id number] Date of Birth: Feb 27, 1941   Medicare Observation Status Notification Given:  Yes    Anise Kerns 01/18/2024, 3:49 PM

## 2024-01-18 NOTE — Progress Notes (Signed)
 PROGRESS NOTE    [name]  ZOX:[id number] DOB: May 08, 1941 DOA: 01/17/2024 PCP: Claire Crick, MD  Outpatient Specialists: urology, cardiology    Brief Narrative:   From admission h and p  [name] is a 83 y.o. male with PMH significant for DM2, HTN, HLD, OSA on CPAP, CAD/MI/stent, h/o systolic CHF, carotid artery disease, CKD, COPD, GERD, diverticulosis, anxiety, depression, chronic prostatitis stenosis. Patient presented to the ED with complaint of chest discomfort. 3 weeks ago, patient had total replacement of right shoulder.   Yesterday 6/3, patient underwent cystoscopy with bladder biopsy/fulguration by Dr. Cherylene Corrente. Reports compliance to home meds including Lasix , aspirin , Coreg .  Not on any anticoagulant.   Last night, patient developed midsternal chest discomfort which seems to wax and wane also worsens with exertion. He was called this morning from the urologist office for postprocedure follow-up, he reported chest symptoms and hence he was urged to come to the ED.  In the ED, patient febrile, hemodynamically stable, breathing on room air at rest. Labs showed WC count of 8.1, hemoglobin 11.4, troponin normal at 16, proBNP pending, BUN/creatinine 32/1.42. Chest x-ray unremarkable EKG showed normal sinus rhythm at 71 bpm, QTc 419 ms, LAFB no significant change from 2 months ago. Given significant cardiac history, hospitalist consultation was requested   At the time of my evaluation, patient was propped up in bed.  Continued to have mild chest tightness.  Not on supplemental oxygen .  Family was at bedside. History reviewed as above.   Assessment & Plan:   Principal Problem:   Unstable angina (HCC) Active Problems:   Essential hypertension   Coronary artery disease involving native coronary artery of native heart with unstable angina pectoris (HCC)   DVT, HX OF   Type 2 diabetes mellitus with other specified complication (HCC)   Asthma-COPD overlap  syndrome (HCC)   CKD (chronic kidney disease) stage 3, GFR 30-59 ml/min (HCC)   Chronic heart failure with preserved ejection fraction (HFpEF) (HCC)   Urothelial carcinoma of bladder (HCC)  # CAD # Chest pain History 2-vessel cabg in the 1990s, PCI with DES in 2024, here with one day substernal exertional chest pressure (resolved). Trivial troponin elevation, preserved EF with no RWMAs on TTE, but nuclear stress test abnormal and high-risk. Hemodynamically stable. CTA no PE - continue heparin , aspirin  - cont coreg , zetia , atorvastatin , imdur , spironolactone  - for LHC tomorrow with possible intervention  # HFpEF Euvolemic  # T2DM Euglycemic - SSI  # Bladder cancer # Bladder outlet obstruction Recent cystoscopy shows recurrence - outpt urology f/u - cont home flomax   # Recent right shoulder surgery Appears to be healing appropriately - ortho f/u  # History DVT Not anticoagulated  # ckd 3a Kidney function is at baseline  # Asthma No exacerbation - breztri  for home trelegy   DVT prophylaxis: n/a Code Status: full Family Communication: none at bedside  Level of care: Telemetry Cardiac Status is: Observation   Consultants:  cardiology  Procedures: Nuclear stress test LHC pending  Antimicrobials:  none    Subjective: No chest pain/pressure, no dyspnea  Objective: Vitals:   01/18/24 0456 01/18/24 0800 01/18/24 1329 01/18/24 1545  BP: (!) 136/59 (!) 148/59 (!) 129/53 (!) 130/55  Pulse: 72 69 66 69  Resp: 20 20  20   Temp: 97.9 F (36.6 C) 97.8 F (36.6 C) 98.1 F (36.7 C) 97.6 F (36.4 C)  TempSrc: Oral     SpO2: 98% 93% 94% 97%  Weight:  Height:        Intake/Output Summary (Last 24 hours) at 01/18/2024 1659 Last data filed at 01/18/2024 1004 Gross per 24 hour  Intake 240 ml  Output 2050 ml  Net -1810 ml   Filed Weights   01/17/24 1318  Weight: 96.3 kg    Examination:  General exam: Appears calm and comfortable  Respiratory system:  Clear to auscultation. Respiratory effort normal. Cardiovascular system: S1 & S2 heard, RRR. Soft systolic murmur Gastrointestinal system: Abdomen is nondistended, soft and nontender.   Central nervous system: Alert and oriented. No focal neurological deficits. Extremities: Symmetric 5 x 5 power. Skin: No rashes, lesions or ulcers Psychiatry: Judgement and insight appear normal. Mood & affect appropriate.     Data Reviewed: I have personally reviewed following labs and imaging studies  CBC: Recent Labs  Lab 01/17/24 1156 01/18/24 0217  WBC 8.1 6.3  HGB 11.4* 11.3*  HCT 34.5* 34.4*  MCV 97.5 96.6  PLT 191 182   Basic Metabolic Panel: Recent Labs  Lab 01/17/24 1156 01/18/24 0217  NA 138 139  K 4.2 4.6  CL 106 105  CO2 22 25  GLUCOSE 124* 161*  BUN 32* 38*  CREATININE 1.42* 1.50*  CALCIUM  8.8* 8.7*   GFR: Estimated Creatinine Clearance: 45.5 mL/min (A) (by C-G formula based on SCr of 1.5 mg/dL (H)). Liver Function Tests: No results for input(s): "AST", "ALT", "ALKPHOS", "BILITOT", "PROT", "ALBUMIN " in the last 168 hours. No results for input(s): "LIPASE", "AMYLASE" in the last 168 hours. No results for input(s): "AMMONIA" in the last 168 hours. Coagulation Profile: Recent Labs  Lab 01/17/24 1808  INR 1.0   Cardiac Enzymes: No results for input(s): "CKTOTAL", "CKMB", "CKMBINDEX", "TROPONINI" in the last 168 hours. BNP (last 3 results) Recent Labs    12/28/23 1601  PROBNP 329.0*   HbA1C: No results for input(s): "HGBA1C" in the last 72 hours. CBG: Recent Labs  Lab 01/17/24 1826 01/17/24 2145 01/18/24 0757 01/18/24 1325 01/18/24 1602  GLUCAP 109* 141* 134* 135* 172*   Lipid Profile: No results for input(s): "CHOL", "HDL", "LDLCALC", "TRIG", "CHOLHDL", "LDLDIRECT" in the last 72 hours. Thyroid  Function Tests: No results for input(s): "TSH", "T4TOTAL", "FREET4", "T3FREE", "THYROIDAB" in the last 72 hours. Anemia Panel: No results for input(s):  "VITAMINB12", "FOLATE", "FERRITIN", "TIBC", "IRON", "RETICCTPCT" in the last 72 hours. Urine analysis:    Component Value Date/Time   COLORURINE YELLOW 01/01/2022 2027   APPEARANCEUR Clear 01/05/2024 1300   LABSPEC 1.013 01/01/2022 2027   PHURINE 5.0 01/01/2022 2027   GLUCOSEU Negative 01/05/2024 1300   HGBUR NEGATIVE 01/01/2022 2027   HGBUR negative 07/02/2008 0832   BILIRUBINUR Negative 01/05/2024 1300   KETONESUR NEGATIVE 01/01/2022 2027   PROTEINUR Negative 01/05/2024 1300   PROTEINUR NEGATIVE 01/01/2022 2027   UROBILINOGEN 0.2 12/28/2023 1605   UROBILINOGEN 0.2 07/02/2008 0832   NITRITE Negative 01/05/2024 1300   NITRITE NEGATIVE 01/01/2022 2027   LEUKOCYTESUR Negative 01/05/2024 1300   LEUKOCYTESUR NEGATIVE 01/01/2022 2027   Sepsis Labs: @LABRCNTIP (procalcitonin:4,lacticidven:4)  )No results found for this or any previous visit (from the past 240 hours).       Radiology Studies: NM Myocar Multi W/Spect W/Wall Motion / EF Result Date: 01/18/2024   Findings are consistent with infarction with peri-infarct ischemia. The study is high risk.   No ST deviation was noted.   LV perfusion is abnormal. There is evidence of ischemia. There is evidence of infarction. Defect 1: There is a large defect with moderate reduction in  uptake present in the apical to basal anteroseptal location(s) that is partially reversible. There is abnormal wall motion in the defect area. Consistent with infarction and peri-infarct ischemia. Defect 2: There is a medium defect with moderate reduction in uptake present in the mid to basal inferolateral location(s) that is partially reversible. Consistent with infarction and peri-infarct ischemia.   Left ventricular function is abnormal. Global function is mildly reduced. Nuclear stress EF: 44%. End diastolic cavity size is mildly enlarged. End systolic cavity size is mildly enlarged.   When compared to previous study in 2024, the inferolateral defect seems to be  improved.  However, anteroseptal partially reversible defect seems to be new.   ECHOCARDIOGRAM COMPLETE Result Date: 01/18/2024    ECHOCARDIOGRAM REPORT   Patient Name:   ROLANDO HESSLING Date of Exam: 01/17/2024 Medical Rec #:  [id number]         Height:       71.7 in Accession #:    [id number]        Weight:       212.3 lb Date of Birth:  05-18-1941         BSA:          2.179 m Patient Age:    82 years          BP:           160/62 mmHg Patient Gender: M                 HR:           71 bpm. Exam Location:  [facility] Procedure: 2D Echo, Cardiac Doppler and Color Doppler (Both Spectral and Color            Flow Doppler were utilized during procedure). Indications:     R94.31 Abnormal EKG  History:         Patient has prior history of Echocardiogram examinations, most                  recent 09/05/2023. CAD and Previous Myocardial Infarction, Prior                  CABG, Arrythmias:LBBB, Signs/Symptoms:Murmur; Risk                  Factors:Hypertension, Dyslipidemia and Diabetes. Obstructive                  sleep apnea-CPAP. Palpitations. Emphysema. Chronic Kidney                  Disease.  Sonographer:     Brigid Canada RDCS Referring Phys:  [id number] Hoyt Macleod Diagnosing Phys: Antionette Kirks MD IMPRESSIONS  1. Left ventricular ejection fraction, by estimation, is 50 to 55%. The left ventricle has low normal function. The left ventricle has no regional wall motion abnormalities. The left ventricular internal cavity size was mildly dilated. Left ventricular diastolic parameters were normal.  2. Right ventricular systolic function is normal. The right ventricular size is normal. Tricuspid regurgitation signal is inadequate for assessing PA pressure.  3. Left atrial size was mildly dilated.  4. The mitral valve is normal in structure. Mild mitral valve regurgitation. No evidence of mitral stenosis.  5. The aortic valve is calcified. Aortic valve regurgitation is not visualized. Mild to moderate aortic valve  stenosis. Aortic valve area, by VTI measures 1.16 cm. Aortic valve mean gradient measures 11.2 mmHg. FINDINGS  Left Ventricle: Left ventricular ejection fraction, by estimation, is 50 to 55%.  The left ventricle has low normal function. The left ventricle has no regional wall motion abnormalities. The left ventricular internal cavity size was mildly dilated. There is no left ventricular hypertrophy. Left ventricular diastolic parameters were normal. Right Ventricle: The right ventricular size is normal. No increase in right ventricular wall thickness. Right ventricular systolic function is normal. Tricuspid regurgitation signal is inadequate for assessing PA pressure. Left Atrium: Left atrial size was mildly dilated. Right Atrium: Right atrial size was normal in size. Pericardium: There is no evidence of pericardial effusion. Mitral Valve: The mitral valve is normal in structure. Mild mitral annular calcification. Mild mitral valve regurgitation. No evidence of mitral valve stenosis. Tricuspid Valve: The tricuspid valve is normal in structure. Tricuspid valve regurgitation is trivial. No evidence of tricuspid stenosis. The aortic valve is calcified. Aortic valve regurgitation is not visualized. Mild to moderate aortic stenosis is present. Pulmonic Valve: The pulmonic valve was normal in structure. Pulmonic valve regurgitation is mild. No evidence of pulmonic stenosis. Aorta: The aortic root is normal in size and structure. Venous: The inferior vena cava was not well visualized. IAS/Shunts: No atrial level shunt detected by color flow Doppler.  LEFT VENTRICLE PLAX 2D LVIDd:         5.80 cm   Diastology LVIDs:         4.00 cm   LV e' medial:    5.44 cm/s LV PW:         0.90 cm   LV E/e' medial:  19.5 LV IVS:        0.80 cm   LV e' lateral:   11.10 cm/s LVOT diam:     2.20 cm   LV E/e' lateral: 9.5 LV SV:         59 LV SV Index:   27 LVOT Area:     3.80 cm  RIGHT VENTRICLE RV Basal diam:  4.10 cm RV S prime:     13.87  cm/s TAPSE (M-mode): 2.5 cm LEFT ATRIUM             Index        RIGHT ATRIUM           Index LA diam:        5.10 cm 2.34 cm/m   RA Area:     16.30 cm LA Vol (A2C):   73.4 ml 33.68 ml/m  RA Volume:   47.10 ml  21.61 ml/m LA Vol (A4C):   68.5 ml 31.43 ml/m LA Biplane Vol: 73.6 ml 33.77 ml/m  AORTIC VALVE AV Area (Vmax):    1.34 cm AV Area (Vmean):   1.25 cm AV Area (VTI):     1.16 cm AV Vmax:           221.00 cm/s AV Vmean:          157.800 cm/s AV VTI:            0.513 m AV Peak Grad:      19.5 mmHg AV Mean Grad:      11.2 mmHg LVOT Vmax:         77.70 cm/s LVOT Vmean:        51.900 cm/s LVOT VTI:          0.156 m LVOT/AV VTI ratio: 0.30  AORTA Ao Root diam: 3.40 cm Ao Asc diam:  3.30 cm MITRAL VALVE MV Area (PHT): 4.06 cm     SHUNTS MV Decel Time: 187 msec     Systemic VTI:  0.16 m MV E velocity: 106.00 cm/s  Systemic Diam: 2.20 cm MV A velocity: 121.33 cm/s MV E/A ratio:  0.87 Antionette Kirks MD Electronically signed by Antionette Kirks MD Signature Date/Time: 01/18/2024/11:05:35 AM    Final    CT Angio Chest Pulmonary Embolism (PE) W or WO Contrast Result Date: 01/17/2024 CLINICAL DATA:  Chest pain and shortness of breath. EXAM: CT ANGIOGRAPHY CHEST WITH CONTRAST TECHNIQUE: Multidetector CT imaging of the chest was performed using the standard protocol during bolus administration of intravenous contrast. Multiplanar CT image reconstructions and MIPs were obtained to evaluate the vascular anatomy. RADIATION DOSE REDUCTION: This exam was performed according to the departmental dose-optimization program which includes automated exposure control, adjustment of the mA and/or kV according to patient size and/or use of iterative reconstruction technique. CONTRAST:  75mL OMNIPAQUE  IOHEXOL  350 MG/ML SOLN COMPARISON:  Radiograph earlier today.  Chest CT 04/20/2021 FINDINGS: Cardiovascular: There are no filling defects within the pulmonary arteries to suggest pulmonary embolus. The heart is mildly enlarged. Aortic  atherosclerosis. There is no contrast in the aorta. No pericardial effusion. Status post CABG with calcification of native coronary arteries. Mediastinum/Nodes: No suspicious mediastinal or hilar lymphadenopathy. Unremarkable appearance of the esophagus. No thyroid  nodule. Lungs/Pleura: Emphysema with bronchial thickening. Patchy areas of ground-glass opacity within the lower lobes, right middle lobe and lingula. This is slightly more prominent than on prior exam. No confluent airspace disease. No pleural fluid. Upper Abdomen: No acute upper abdominal findings. Musculoskeletal: Reverse right shoulder arthroplasty. Median sternotomy. Review of the MIP images confirms the above findings. IMPRESSION: 1. No pulmonary embolus. 2. Patchy areas of ground-glass opacity within the lower lobes, right middle lobe and lingula, slightly more prominent than on prior exam. This is nonspecific, but can be seen with atypical infection or inflammation. This is likely superimposed on scarring. Aortic Atherosclerosis (ICD10-I70.0) and Emphysema (ICD10-J43.9). Electronically Signed   By: Chadwick Colonel M.D.   On: 01/17/2024 22:28   DG Chest 2 View Result Date: 01/17/2024 CLINICAL DATA:  Chest pain. EXAM: CHEST - 2 VIEW COMPARISON:  October 24, 2023. FINDINGS: The heart size and mediastinal contours are within normal limits. Both lungs are clear. Status post coronary artery bypass graft and right shoulder arthroplasty. IMPRESSION: No active cardiopulmonary disease. Electronically Signed   By: Rosalene Colon M.D.   On: 01/17/2024 12:28        Scheduled Meds:  aspirin  EC  81 mg Oral Daily   atorvastatin   20 mg Oral Daily   carvedilol   3.125 mg Oral BID WC   ezetimibe   10 mg Oral Daily   gabapentin   300 mg Oral BID   heparin   1,450 Units Intravenous Once   insulin  aspart  0-5 Units Subcutaneous QHS   insulin  aspart  0-9 Units Subcutaneous TID WC   isosorbide  mononitrate  30 mg Oral Daily   [START ON 01/19/2024] losartan    50 mg Oral Daily   montelukast   10 mg Oral Daily   ranolazine   500 mg Oral BID   spironolactone   12.5 mg Oral Daily   tamsulosin   0.4 mg Oral QHS   Continuous Infusions:  heparin  1,250 Units/hr (01/18/24 1353)     LOS: 0 days     Raymonde Calico, MD [facility]   If 7PM-7AM, please contact night-coverage [email or web address] Password [email or web address] 01/18/2024, 4:59 PM

## 2024-01-19 ENCOUNTER — Encounter
Admission: EM | Disposition: A | Discharge status: Home / Self Care | Payer: Self-pay | Source: Home / Self Care | Attending: Emergency Medicine

## 2024-01-19 ENCOUNTER — Encounter: Payer: Self-pay | Admitting: Internal Medicine

## 2024-01-19 DIAGNOSIS — I25110 Atherosclerotic heart disease of native coronary artery with unstable angina pectoris: Principal | ICD-10-CM

## 2024-01-19 DIAGNOSIS — I200 Unstable angina: Secondary | ICD-10-CM | POA: Diagnosis not present

## 2024-01-19 DIAGNOSIS — E785 Hyperlipidemia, unspecified: Secondary | ICD-10-CM | POA: Diagnosis not present

## 2024-01-19 DIAGNOSIS — R0789 Other chest pain: Secondary | ICD-10-CM | POA: Diagnosis not present

## 2024-01-19 DIAGNOSIS — I5022 Chronic systolic (congestive) heart failure: Secondary | ICD-10-CM | POA: Diagnosis not present

## 2024-01-19 DIAGNOSIS — I10 Essential (primary) hypertension: Secondary | ICD-10-CM | POA: Diagnosis not present

## 2024-01-19 HISTORY — PX: LEFT HEART CATH AND CORS/GRAFTS ANGIOGRAPHY: CATH118250

## 2024-01-19 LAB — CBC
HCT: 34.8 % — ABNORMAL LOW (ref 39.0–52.0)
Hemoglobin: 11.5 g/dL — ABNORMAL LOW (ref 13.0–17.0)
MCH: 31.9 pg (ref 26.0–34.0)
MCHC: 33 g/dL (ref 30.0–36.0)
MCV: 96.7 fL (ref 80.0–100.0)
Platelets: 160 10*3/uL (ref 150–400)
RBC: 3.6 MIL/uL — ABNORMAL LOW (ref 4.22–5.81)
RDW: 12.8 % (ref 11.5–15.5)
WBC: 6 10*3/uL (ref 4.0–10.5)
nRBC: 0 % (ref 0.0–0.2)

## 2024-01-19 LAB — BASIC METABOLIC PANEL WITH GFR
Anion gap: 9 (ref 5–15)
BUN: 40 mg/dL — ABNORMAL HIGH (ref 8–23)
CO2: 26 mmol/L (ref 22–32)
Calcium: 8.5 mg/dL — ABNORMAL LOW (ref 8.9–10.3)
Chloride: 104 mmol/L (ref 98–111)
Creatinine, Ser: 1.46 mg/dL — ABNORMAL HIGH (ref 0.61–1.24)
GFR, Estimated: 48 mL/min — ABNORMAL LOW (ref >60)
Glucose, Bld: 122 mg/dL — ABNORMAL HIGH (ref 70–99)
Potassium: 3.7 mmol/L (ref 3.5–5.1)
Sodium: 139 mmol/L (ref 135–145)

## 2024-01-19 LAB — HEPARIN LEVEL (UNFRACTIONATED): Heparin Unfractionated: 0.58 [IU]/mL (ref 0.30–0.70)

## 2024-01-19 LAB — GLUCOSE, CAPILLARY
Glucose-Capillary: 124 mg/dL — ABNORMAL HIGH (ref 70–99)
Glucose-Capillary: 157 mg/dL — ABNORMAL HIGH (ref 70–99)

## 2024-01-19 SURGERY — LEFT HEART CATH AND CORS/GRAFTS ANGIOGRAPHY
Anesthesia: Moderate Sedation

## 2024-01-19 MED ORDER — HEPARIN SODIUM (PORCINE) 5000 UNIT/ML IJ SOLN: 5000.0000 [IU] | Freq: Three times a day (TID) | INTRAMUSCULAR | Status: DC

## 2024-01-19 MED ORDER — SODIUM CHLORIDE 0.9 % IV SOLN
250.0000 mL | INTRAVENOUS | Status: DC | PRN
Start: 2024-01-19 — End: 2024-01-19

## 2024-01-19 MED ORDER — LIDOCAINE HCL 1 % IJ SOLN
INTRAMUSCULAR | Status: AC
  Filled 2024-01-19: qty 20

## 2024-01-19 MED ORDER — VERAPAMIL HCL 2.5 MG/ML IV SOLN
INTRAVENOUS | Status: AC
  Filled 2024-01-19: qty 2

## 2024-01-19 MED ORDER — METHYLPREDNISOLONE SODIUM SUCC 125 MG IJ SOLR
INTRAMUSCULAR | Status: AC
  Filled 2024-01-19: qty 2

## 2024-01-19 MED ORDER — EZETIMIBE 10 MG PO TABS: 10.0000 mg | ORAL_TABLET | Freq: Every day | ORAL | Status: DC

## 2024-01-19 MED ORDER — CARVEDILOL 6.25 MG PO TABS: 6.2500 mg | ORAL_TABLET | Freq: Two times a day (BID) | ORAL | 1 refills | Status: DC

## 2024-01-19 MED ORDER — LIDOCAINE HCL (PF) 1 % IJ SOLN
INTRAMUSCULAR | Status: DC | PRN
Start: 2024-01-19 — End: 2024-01-19
  Administered 2024-01-19: 2 mL

## 2024-01-19 MED ORDER — HEPARIN (PORCINE) IN NACL 1000-0.9 UT/500ML-% IV SOLN
INTRAVENOUS | Status: DC | PRN
Start: 2024-01-19 — End: 2024-01-19
  Administered 2024-01-19 (×2): 500 mL

## 2024-01-19 MED ORDER — SODIUM CHLORIDE 0.9 % IV SOLN: INTRAVENOUS | Status: DC

## 2024-01-19 MED ORDER — FENTANYL CITRATE (PF) 100 MCG/2ML IJ SOLN
INTRAMUSCULAR | Status: DC | PRN
  Administered 2024-01-19: 12.5 ug via INTRAVENOUS

## 2024-01-19 MED ORDER — MIDAZOLAM HCL 2 MG/2ML IJ SOLN
INTRAMUSCULAR | Status: AC
Start: 2024-01-19 — End: ?
  Filled 2024-01-19: qty 2

## 2024-01-19 MED ORDER — SODIUM CHLORIDE 0.9% FLUSH
3.0000 mL | Freq: Two times a day (BID) | INTRAVENOUS | Status: DC
  Administered 2024-01-19: 3 mL via INTRAVENOUS

## 2024-01-19 MED ORDER — MIDAZOLAM HCL 2 MG/2ML IJ SOLN
INTRAMUSCULAR | Status: DC | PRN
Start: 2024-01-19 — End: 2024-01-19
  Administered 2024-01-19: .5 mg via INTRAVENOUS

## 2024-01-19 MED ORDER — SODIUM CHLORIDE 0.9% FLUSH: 3.0000 mL | INTRAVENOUS | Status: DC | PRN

## 2024-01-19 MED ORDER — HEPARIN SODIUM (PORCINE) 1000 UNIT/ML IJ SOLN
INTRAMUSCULAR | Status: DC | PRN
  Administered 2024-01-19: 5000 [IU] via INTRAVENOUS

## 2024-01-19 MED ORDER — VERAPAMIL HCL 2.5 MG/ML IV SOLN
INTRAVENOUS | Status: DC | PRN
Start: 2024-01-19 — End: 2024-01-19
  Administered 2024-01-19 (×2): 2.5 mg via INTRA_ARTERIAL

## 2024-01-19 MED ORDER — HYDRALAZINE HCL 20 MG/ML IJ SOLN: 10.0000 mg | INTRAMUSCULAR | Status: AC | PRN

## 2024-01-19 MED ORDER — FENTANYL CITRATE (PF) 100 MCG/2ML IJ SOLN
INTRAMUSCULAR | Status: AC
  Filled 2024-01-19: qty 2

## 2024-01-19 MED ORDER — HEPARIN SODIUM (PORCINE) 1000 UNIT/ML IJ SOLN
INTRAMUSCULAR | Status: AC
  Filled 2024-01-19: qty 10

## 2024-01-19 MED ORDER — CARVEDILOL 6.25 MG PO TABS: 6.2500 mg | ORAL_TABLET | Freq: Two times a day (BID) | ORAL | Status: DC

## 2024-01-19 MED ORDER — METHYLPREDNISOLONE SODIUM SUCC 125 MG IJ SOLR
125.0000 mg | Freq: Once | INTRAMUSCULAR | Status: AC
  Administered 2024-01-19: 125 mg via INTRAVENOUS

## 2024-01-19 MED ORDER — LABETALOL HCL 5 MG/ML IV SOLN: 10.0000 mg | INTRAVENOUS | Status: AC | PRN

## 2024-01-19 MED ORDER — IOHEXOL 300 MG/ML  SOLN
INTRAMUSCULAR | Status: DC | PRN
  Administered 2024-01-19: 60 mL

## 2024-01-19 SURGICAL SUPPLY — 13 items
CATH INFINITI 5 FR IM (CATHETERS) IMPLANT
CATH INFINITI 5 FR JL3.5 (CATHETERS) IMPLANT
CATH INFINITI 5FR JL4 (CATHETERS) IMPLANT
CATH INFINITI JR4 5F (CATHETERS) IMPLANT
DEVICE RAD TR BAND REGULAR (VASCULAR PRODUCTS) IMPLANT
DRAPE BRACHIAL (DRAPES) IMPLANT
GLIDESHEATH SLEND SS 6F .021 (SHEATH) IMPLANT
GUIDEWIRE INQWIRE 1.5J.035X260 (WIRE) IMPLANT
KIT SYRINGE INJ CVI SPIKEX1 (MISCELLANEOUS) IMPLANT
PACK CARDIAC CATH (CUSTOM PROCEDURE TRAY) ×2 IMPLANT
PAD ELECT DEFIB RADIOL ZOLL (MISCELLANEOUS) IMPLANT
SET ATX-X65L (MISCELLANEOUS) IMPLANT
STATION PROTECTION PRESSURIZED (MISCELLANEOUS) IMPLANT

## 2024-01-19 NOTE — Consult Note (Signed)
 Pharmacy Consult Note - Anticoagulation  Pharmacy Consult for heparin  Indication: chest pain/ACS  PATIENT MEASUREMENTS: Height: 5' 11.75" (182.2 cm) Weight: 96.3 kg (212 lb 4.9 oz) IBW/kg (Calculated) : 77.03 HEPARIN  DW (KG): 96.3  VITAL SIGNS: Temp: 97.7 F (36.5 C) (06/05 2330) Temp Source: Oral (06/05 2330) BP: 133/55 (06/05 2330) Pulse Rate: 69 (06/05 2330)  Recent Labs    01/17/24 1444 01/17/24 1808 01/17/24 1812 01/18/24 0217 01/19/24 0046  HGB  --   --   --    < > 11.5*  HCT  --   --   --    < > 34.8*  PLT  --   --   --    < > 160  APTT  --   --  23*  --   --   LABPROT  --  13.3  --   --   --   INR  --  1.0  --   --   --   HEPARINUNFRC  --   --   --    < > 0.58  CREATININE  --   --   --    < > 1.46*  TROPONINIHS 18*  --   --   --   --    < > = values in this interval not displayed.    Estimated Creatinine Clearance: 46.7 mL/min (A) (by C-G formula based on SCr of 1.46 mg/dL (H)).  PAST MEDICAL HISTORY: Past Medical History:  Diagnosis Date   Actinic keratosis    Anginal pain (HCC)    Anxiety    Aortic ectasia, abdominal (HCC)    a.) CT abd 01/01/2022: 2.9 cm infrarenal abdominal aorta   Aortic stenosis    Bladder cancer (HCC) 2023   BPH (benign prostatic hyperplasia) 09/06/2007   CAD s/p CABG    a.) 1990 s/p MI--> CABG x 3; b.) LHC/PCI 2002 --> BMS (unk type) to LCx   Carotid arterial disease (HCC)    a.) 08/2016 Carotid U/S: <39% bilat; b.) carotid doppler 10/12/2022: mild-to-moderate (<50%) bilateral bifurcation plaque.   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 06/2019 Echo (in setting of COVID): EF 35-40%, mild LVH, g1 DD, glob HK; b. 09/2019 Echo: EF 50-55%, Gr1 DD; c. 03/2021 Echo: EF 50-55%, no rwma, mild LVH, GrI DD, nl RV fxn. RVSP 44.61mmHg. Mild-mod dil LA. Triv MR. Mild-mod Ao sclerosis.   History of 2019 novel coronavirus disease (COVID-19) 05/20/2019   History of hiatal hernia    History of shingles    HLD (hyperlipidemia) 04/26/2007   HTN (hypertension) 04/30/2007   LBBB (left bundle branch block)    Long-term use of aspirin  therapy    Lumbar disc disease with radiculopathy    Migraines    Myocardial infarction (HCC) 1990   a.) resulted in 3v CABG  Myocardial infarction [facility]) 2002   a.) second cardiac event; PCI with placement of BMS (unknown type) to LCx   Neuropathy of both feet    OSA on CPAP    Osteoarthritis    PAD (peripheral artery disease) (HCC)    a. 07/2017 LE duplex: RSFA 75-55m, LSFA 75-80m, 50-74d; c. 08/2018 Periph Angio: No signif AoIliac dzs. Mod-sev Ca2+ RSFA w/ diff dzs throughout- 3 vessel runoff. Borderline signif LSFA dzs w/ mod-sev Ca2+ vessels and 3 vessel runoff below the knee-->Med rx.   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 male with PMH including CAD s/p CABGx3, HTN, PAD, VTE is presenting with chest pain/ACS.  Patient is not on chronic anticoagulation per chart review. Pharmacy has been consulted to initiate and manage heparin  intravenous infusion.  Pertinent medications: No chronic anticoagulation prior to admission per chart review  Goal(s) of therapy: Heparin  level 0.3 - 0.7 units/mL Monitor platelets by anticoagulation protocol: Yes   Baseline anticoagulation labs: Recent Labs    01/17/24 1156 01/17/24 1808 01/17/24 1812 01/18/24 0217 01/19/24 0046  APTT  --   --  23*  --   --   INR  --  1.0  --   --   --   HGB 11.4*  --   --  11.3* 11.5*  PLT 191  --   --  182 160    Date Time aPTT/HL Rate/Comment 06/05   0217   0.38                 Therapeutic x 1  0605    1538   0.29                 Subtherapeutic  0606    0046   0.58                 Therapeutic X 1   PLAN: 6/06:  HL @ 0046 = 0.58, therapeutic X 1 - Will continue pt on current rate and recheck HL in 8 hrs on 6/06 @ 0900.  CBC stable; monitor daily while on heparin  infusion  Zevin Nevares D, PharmD 01/19/2024 1:44 AM

## 2024-01-19 NOTE — TOC CM/SW Note (Signed)
 Transition of Care [facility]) - Inpatient Brief Assessment   Patient Details  Name: [name] MRN: [id number] Date of Birth: [birth year]  Transition of Care [facility]) CM/SW Contact:    Odilia Bennett, LCSW Phone Number: 01/19/2024, 8:45 AM   Clinical Narrative: CSW reviewed chart. No TOC needs identified so far. CSW will continue to follow progress. Please place [facility] consult if any needs arise.  Transition of Care Asessment: Insurance and Status: Insurance coverage has been reviewed Patient has primary care physician: Yes Home environment has been reviewed: Single family home Prior level of function:: Not documented Prior/Current Home Services: No current home services Social Drivers of Health Review: SDOH reviewed no interventions necessary Readmission risk has been reviewed: Yes Transition of care needs: no transition of care needs at this time

## 2024-01-19 NOTE — Progress Notes (Signed)
 Pt alert and oriented. VSS. Discharge instructions reviewed with pt. Pt verbalized understanding. Pt verbalized understanding of need to schedule/attend follow up appointments. Pt verbalized understanding of changes to medications. 2 IV's removed. IV sites WNL. Left radial cath site WNL. Pt verbalized understanding of restrictions and when to notify MD. Pt left unit in wheelchair with nurse. Pt left hospital with family.

## 2024-01-19 NOTE — Interval H&P Note (Signed)
 History and Physical Interval Note:  01/19/2024 7:54 AM  [name]  has presented today for surgery, with the diagnosis of unstable angina and abnormal stress test.  The various methods of treatment have been discussed with the patient and family. After consideration of risks, benefits and other options for treatment, the patient has consented to  Procedure(s): LEFT HEART CATH AND CORS/GRAFTS ANGIOGRAPHY ([name]) as a surgical intervention.  The patient's history has been reviewed, patient examined, no change in status, stable for surgery.  I have reviewed the patient's chart and labs.  Questions were answered to the patient's satisfaction.    Cath Lab Visit (complete for each Cath Lab visit)  Clinical Evaluation Leading to the Procedure:   ACS: Yes.    Non-ACS:  [name]  Naylee Frankowski

## 2024-01-19 NOTE — Progress Notes (Signed)
 Progress Note  Patient Name: [name] Date of Encounter: 01/19/2024 [facility] Cardiologist: Belva Boyden, MD   Interval Summary   Patient feels well without further chest pain.  He denies shortness of breath and palpitations.  Catheterization showed modest progression of moderate to severe LMCA and severe distal LCx and LPDA disease.  Proximal and mid LCx stents are widely patent, as are all bypass grafts.  Severely diseased nondominant RCA is unchanged.  Vital Signs Vitals:   01/19/24 1015 01/19/24 1030 01/19/24 1050 01/19/24 1421  BP: (!) 160/65  (!) 149/63 137/65  Pulse: 71 77 69 85  Resp: 14 (!) 21 20   Temp:   97.9 F (36.6 C) 97.8 F (36.6 C)  TempSrc:   Oral Oral  SpO2: 95% 96% 95% 98%  Weight:      Height:        Intake/Output Summary (Last 24 hours) at 01/19/2024 1432 Last data filed at 01/19/2024 1100 Gross per 24 hour  Intake 1646.92 ml  Output 2075 ml  Net -428.08 ml      01/17/2024    1:18 PM 01/10/2024   10:17 AM 01/10/2024    9:22 AM  Last 3 Weights  Weight (lbs) 212 lb 4.9 oz 212 lb 6 oz 224 lb 6 oz  Weight (kg) 96.3 kg 96.333 kg 101.776 kg      Telemetry/ECG  Sinus rhythm with PVCs - Personally Reviewed  Physical Exam  GEN: No acute distress.   Neck: No JVD Cardiac: Regular rate and rhythm with occasional extrasystoles.  Left radial arteriotomy site without hematoma; covered with clean dressing. Respiratory: Clear to auscultation bilaterally. GI: Soft, nontender, non-distended  MS: No edema  Assessment & Plan  Coronary artery disease with unstable angina: Mr. Nigh presents with atypical chest pain following has TURBT 2 days ago.  CTA chest was negative for PE.  MPI yesterday was high risk with large area of anteroseptal ischemia and inferolateral infarct with peri-infarct ischemia.  Catheterization today showed some progression of LMCA disease with up to 50 to 60% stenosis.  There is also severe distal LCx and LPDA disease  well beyond the previously placed stents.  I am reassured that the stents and by past grafts are patent.  The LMCA disease does not appear critical.  The distal LCx and LPDA are small.  I would favor escalating antianginal therapy and have increased carvedilol  to 6.25 mg twice daily.  Continue current doses of isosorbide  mononitrate and ranolazine .  If Mr. Dawes continues to have significant angina despite maximum tolerated doses of these antianginal agents, IVUS interrogation of the LMCA with possible PCI to this lesion as well as the distal LCx could be considered.  The LPDA is too small for PCI.  Continue aggressive secondary prevention with aspirin  atorvastatin , and ezetimibe .  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 Beagley appears euvolemic on exam.  LVEDP was mildly elevated following prehydration for catheterization at 20 mmHg.  Recommend maintaining net even to slightly negative fluid balance.  Will need to monitor his renal function closely with BMP to be checked at follow-up given his history of CKD and to contrast exposures this admission.  As above, I will increase carvedilol  to 6.25 mg twice daily.  Continue as needed furosemide  for edema/weight gain.  Continue losartan  50 mg daily and spironolactone  12.5 mg daily for GDMT.  If Mr. Gentles is able to ambulate without difficulty following his catheterization today, I think it would be appropriate for him to  be discharged home.  Will have him follow-up with Dr. Gollan or an APP in about 2 weeks.  BMP should be obtained at that time.  For questions or updates, please contact [facility] Please consult [email or web address] for contact info under [facility].  Signed, [name], MD

## 2024-01-19 NOTE — Discharge Summary (Signed)
 [name]:[id number] DOB: 1941/01/16 DOA: 01/17/2024  PCP: Claire Crick, MD  Admit date: 01/17/2024 Discharge date: 01/19/2024  Time spent: 35 minutes  Recommendations for Outpatient Follow-up:  Cardiology f/u ~2 weeks     Discharge Diagnoses:  Principal Problem:   Unstable angina [name]) Active Problems:   Essential hypertension   Coronary artery disease involving native coronary artery of native heart with unstable angina pectoris (HCC)   DVT, HX OF   Type 2 diabetes mellitus with other specified complication (HCC)   Asthma-COPD overlap syndrome (HCC)   CKD (chronic kidney disease) stage 3, GFR 30-59 ml/min (HCC)   Chronic heart failure with preserved ejection fraction (HFpEF) (HCC)   Urothelial carcinoma of bladder (HCC)   Discharge Condition: stable  Diet recommendation: heart healthy  Filed Weights   01/17/24 1318  Weight: 96.3 kg    History of present illness:  From admission h and p: [name] is a 83 y.o. male with PMH significant for DM2, HTN, HLD, OSA on CPAP, CAD/MI/stent, h/o systolic CHF, carotid artery disease, CKD, COPD, GERD, diverticulosis, anxiety, depression, chronic prostatitis stenosis. Patient presented to the ED with complaint of chest discomfort. 3 weeks ago, patient had total replacement of right shoulder.   Yesterday 6/3, patient underwent cystoscopy with bladder biopsy/fulguration by Dr. Cherylene Corrente. Reports compliance to home meds including Lasix , aspirin , Coreg .  Not on any anticoagulant.   Last night, patient developed midsternal chest discomfort which seems to wax and wane also worsens with exertion. He was called this morning from the urologist office for postprocedure follow-up, he reported chest symptoms and hence he was urged to come to the ED.  In the ED, patient febrile, hemodynamically stable, breathing on room air at rest. Labs showed WC count of 8.1, hemoglobin 11.4, troponin normal at 16, proBNP pending,  BUN/creatinine 32/1.42. Chest x-ray unremarkable EKG showed normal sinus rhythm at 71 bpm, QTc 419 ms, LAFB no significant change from 2 months ago. Given significant cardiac history, hospitalist consultation was requested   At the time of my evaluation, patient was propped up in bed.  Continued to have mild chest tightness.  Not on supplemental oxygen .  Family was at bedside. History reviewed as above.  Hospital Course:  Patient presents with angina. CTA neg for PE. Started on heparin . Taken for [facility] by cardiology on 6/6 revealing multi-vessel cad, no clear target lesion. Cardiology advises medical management and have increased patient's coreg  from 3.25 to 6.5. Advise outpatient f/u in about 2 weeks, may need repeat cath with PCI if fails medical mgmt. Other medical problems stable.   Procedures: LHC   Consultations: cardiology  Discharge Exam: Vitals:   01/19/24 1030 01/19/24 1050  BP:  (!) 149/63  Pulse: 77 69  Resp: (!) 21 20  Temp:  97.9 F (36.6 C)  SpO2: 96% 95%    General: NAD Cardiovascular: RRR, soft systolic murmur Respiratory: CTAB  Discharge Instructions   Discharge Instructions     Diet - low sodium heart healthy   Complete by: As directed    Increase activity slowly   Complete by: As directed       Allergies as of 01/19/2024       Reactions   Vioxx [rofecoxib] Other (See Comments)   Hemorrhage    Oxycodone  Other (See Comments)   Could not wake up , felt drunk   Spiriva  Respimat [tiotropium Bromide Monohydrate ] Other (See Comments)   Elevated bp   Contrast Media [iodinated Contrast Media] Itching, Rash  Delayed reaction post abdominal aortagram.    Morphine  Nausea Only, Other (See Comments)   Irritability        Medication List     TAKE these medications    albuterol  (2.5 MG/3ML) 0.083% nebulizer solution Commonly known as: PROVENTIL  TAKE 3 MLS BY NEBULIZATION EVERY 6 HOURSAS NEEDED FOR WHEEZING OR SHORTNESS OF BREATH   albuterol  108  (90 Base) MCG/ACT inhaler Commonly known as: VENTOLIN  HFA TAKE 2 PUFFS INTO LUNGS EVERY 6 HOURS ASNEEDED FOR WHEEZING OR SHORTNESS OF BREATH   amoxicillin  500 MG capsule Commonly known as: AMOXIL  TAKE 4 CAPSULES 30-60 MINUTES PRIOR TO PROCEDURE   aspirin  EC 81 MG tablet Take 1 tablet (81 mg total) by mouth daily.   atorvastatin  20 MG tablet Commonly known as: LIPITOR TAKE 1 TABLET BY MOUTH DAILY   carvedilol  3.125 MG tablet Commonly known as: COREG  TAKE 1 TABLET BY MOUTH TWICE DAILY What changed: Another medication with the same name was added. Make sure you understand how and when to take each.   carvedilol  6.25 MG tablet Commonly known as: COREG  Take 1 tablet (6.25 mg total) by mouth 2 (two) times daily with a meal. What changed: You were already taking a medication with the same name, and this prescription was added. Make sure you understand how and when to take each.   cyclobenzaprine  10 MG tablet Commonly known as: FLEXERIL  Take 0.5 tablets (5 mg total) by mouth 3 (three) times daily as needed for muscle spasms.   docusate sodium  100 MG capsule Commonly known as: COLACE Take 1 capsule (100 mg total) by mouth daily as needed for mild constipation.   Dupixent 300 MG/2ML Soaj Generic drug: Dupilumab Inject 300 mg into the skin every 14 (fourteen) days. **patient completed loading dose in clinic**   ezetimibe  10 MG tablet Commonly known as: ZETIA  TAKE 1 TABLET BY MOUTH DAILY   famotidine  20 MG tablet Commonly known as: PEPCID  Take 20 mg by mouth daily.   fluticasone  50 MCG/ACT nasal spray Commonly known as: FLONASE  Place 1 spray into both nostrils daily as needed for allergies or rhinitis.   furosemide  40 MG tablet Commonly known as: LASIX  Take 0.5 tablets (20 mg total) by mouth daily as needed. Take 1 tablet (40 mg) daily as need for weight greater then 208.   gabapentin  600 MG tablet Commonly known as: NEURONTIN  Take 1 tablet (600 mg total) by mouth 3 (three)  times daily.   hydrocortisone  2.5 % cream Apply topically 2 (two) times daily as needed (Rash).   isosorbide  mononitrate 30 MG 24 hr tablet Commonly known as: IMDUR  TAKE 1 TABLET BY MOUTH DAILY.   losartan  25 MG tablet Commonly known as: COZAAR  Take 1 tablet (25 mg total) by mouth daily.   montelukast  10 MG tablet Commonly known as: SINGULAIR  TAKE 1 TABLET BY MOUTH DAILY   mupirocin  ointment 2 % Commonly known as: BACTROBAN  Apply 1 Application topically 2 (two) times daily.   nitroGLYCERIN  0.4 MG SL tablet Commonly known as: NITROSTAT  Place 1 tablet (0.4 mg total) under the tongue every 5 (five) minutes as needed for chest pain.   ondansetron  4 MG tablet Commonly known as: Zofran  Take 1 tablet (4 mg total) by mouth every 8 (eight) hours as needed for nausea or vomiting.   polyethylene glycol 17 g packet Commonly known as: MIRALAX  / GLYCOLAX  Take 17 g by mouth daily as needed for mild constipation.   ranolazine  500 MG 12 hr tablet Commonly known as: RANEXA  TAKE  ONE (1) TABLET BY MOUTH TWO TIMES PER DAY   spironolactone  25 MG tablet Commonly known as: ALDACTONE  Take 0.5 tablets (12.5 mg total) by mouth daily.   tamsulosin  0.4 MG Caps capsule Commonly known as: FLOMAX  TAKE 1 CAPSULE BY MOUTH AT BEDTIME   Trelegy Ellipta  200-62.5-25 MCG/ACT Aepb Generic drug: Fluticasone -Umeclidin-Vilant Inhale 1 puff into the lungs daily.   VITAMIN B-12 ER PO Take 1 capsule by mouth daily.   Vitamin D3 25 MCG (1000 UT) Caps Take 1 capsule (1,000 Units total) by mouth 2 (two) times a week.       Allergies  Allergen Reactions   Vioxx [Rofecoxib] Other (See Comments)    Hemorrhage    Oxycodone  Other (See Comments)    Could not wake up , felt drunk   Spiriva  Respimat [Tiotropium Bromide Monohydrate ] Other (See Comments)    Elevated bp   Contrast Media [Iodinated Contrast Media] Itching and Rash    Delayed reaction post abdominal aortagram.    Morphine  Nausea Only and Other  (See Comments)    Irritability     Follow-up Information     Devorah Fonder, MD Follow up.   Specialty: Cardiology Why: in about 2 weeks Contact information: [address] [place] [address] [phone]         Claire Crick, MD Follow up.   Specialty: Family Medicine Contact information: [address] [place] [address] [phone]                  The results of significant diagnostics from this hospitalization (including imaging, microbiology, ancillary and laboratory) are listed below for reference.    Significant Diagnostic Studies: CARDIAC CATHETERIZATION Result Date: 01/19/2024 Conclusions: Severe native coronary artery disease, as detailed below.  Compared to the last catheterization in 12/2022, there has been progression of mild to moderate LMCA disease, with up to 50-60% calcified stenosis now present.  There is also severe distal LCx and LPDA disease. Widely patent LIMA-LAD and sequential SVG-D1-D2. Widely patent proximal and mid LCx stents. Mildly elevated left ventricular filling pressure (LVEDP 20 mmHg). Recommendations: Escalate antianginal therapy.  If patient continues to have refractory chest pain in spite of maximum tolerated doses of at least 2 antianginal agents, PCI to distal LCx +/- LMCA may need to be considered. Continue aggressive secondary prevention of coronary artery disease. [name], MD [facility]  NM Myocar Multi W/Spect [facility] Motion / EF Result Date: 01/18/2024   Findings are consistent with infarction with peri-infarct ischemia. The study is high risk.   No ST deviation was noted.   LV perfusion is abnormal. There is evidence of ischemia. There is evidence of infarction. Defect 1: There is a large defect with moderate reduction in uptake present in the apical to basal anteroseptal location(s) that is partially reversible. There is abnormal wall motion in the defect area. Consistent with  infarction and peri-infarct ischemia. Defect 2: There is a medium defect with moderate reduction in uptake present in the mid to basal inferolateral location(s) that is partially reversible. Consistent with infarction and peri-infarct ischemia.   Left ventricular function is abnormal. Global function is mildly reduced. Nuclear stress EF: 44%. End diastolic cavity size is mildly enlarged. End systolic cavity size is mildly enlarged.   When compared to previous study in 2024, the inferolateral defect seems to be improved.  However, anteroseptal partially reversible defect seems to be new.   ECHOCARDIOGRAM COMPLETE Result Date: 01/18/2024    ECHOCARDIOGRAM REPORT  Patient Name:   TRAVEION RUDDOCK Date of Exam: 01/17/2024 Medical Rec #:  [id number]         Height:       71.7 in Accession #:    [id number]        Weight:       212.3 lb Date of Birth:  [birth year]         BSA:          2.179 m Patient Age:    82 years          BP:           160/62 mmHg Patient Gender: M                 HR:           71 bpm. Exam Location:  [facility] Procedure: 2D Echo, Cardiac Doppler and Color Doppler (Both Spectral and Color            Flow Doppler were utilized during procedure). Indications:     R94.31 Abnormal EKG  History:         Patient has prior history of Echocardiogram examinations, most                  recent 09/05/2023. CAD and Previous Myocardial Infarction, Prior                  CABG, Arrythmias:LBBB, Signs/Symptoms:Murmur; Risk                  Factors:Hypertension, Dyslipidemia and Diabetes. Obstructive                  sleep apnea-CPAP. Palpitations. Emphysema. Chronic Kidney                  Disease.  Sonographer:     Brigid Canada RDCS Referring Phys:  [id number] Hoyt Macleod Diagnosing Phys: Antionette Kirks MD IMPRESSIONS  1. Left ventricular ejection fraction, by estimation, is 50 to 55%. The left ventricle has low normal function. The left ventricle has no regional wall motion abnormalities. The left ventricular  internal cavity size was mildly dilated. Left ventricular diastolic parameters were normal.  2. Right ventricular systolic function is normal. The right ventricular size is normal. Tricuspid regurgitation signal is inadequate for assessing PA pressure.  3. Left atrial size was mildly dilated.  4. The mitral valve is normal in structure. Mild mitral valve regurgitation. No evidence of mitral stenosis.  5. The aortic valve is calcified. Aortic valve regurgitation is not visualized. Mild to moderate aortic valve stenosis. Aortic valve area, by VTI measures 1.16 cm. Aortic valve mean gradient measures 11.2 mmHg. FINDINGS  Left Ventricle: Left ventricular ejection fraction, by estimation, is 50 to 55%. The left ventricle has low normal function. The left ventricle has no regional wall motion abnormalities. The left ventricular internal cavity size was mildly dilated. There is no left ventricular hypertrophy. Left ventricular diastolic parameters were normal. Right Ventricle: The right ventricular size is normal. No increase in right ventricular wall thickness. Right ventricular systolic function is normal. Tricuspid regurgitation signal is inadequate for assessing PA pressure. Left Atrium: Left atrial size was mildly dilated. Right Atrium: Right atrial size was normal in size. Pericardium: There is no evidence of pericardial effusion. Mitral Valve: The mitral valve is normal in structure. Mild mitral annular calcification. Mild mitral valve regurgitation. No evidence of mitral valve stenosis. Tricuspid Valve: The tricuspid valve is normal in structure. Tricuspid valve regurgitation is trivial.  No evidence of tricuspid stenosis. The aortic valve is calcified. Aortic valve regurgitation is not visualized. Mild to moderate aortic stenosis is present. Pulmonic Valve: The pulmonic valve was normal in structure. Pulmonic valve regurgitation is mild. No evidence of pulmonic stenosis. Aorta: The aortic root is normal in size  and structure. Venous: The inferior vena cava was not well visualized. IAS/Shunts: No atrial level shunt detected by color flow Doppler.  LEFT VENTRICLE PLAX 2D LVIDd:         5.80 cm   Diastology LVIDs:         4.00 cm   LV e' medial:    5.44 cm/s LV PW:         0.90 cm   LV E/e' medial:  19.5 LV IVS:        0.80 cm   LV e' lateral:   11.10 cm/s LVOT diam:     2.20 cm   LV E/e' lateral: 9.5 LV SV:         59 LV SV Index:   27 LVOT Area:     3.80 cm  RIGHT VENTRICLE RV Basal diam:  4.10 cm RV S prime:     13.87 cm/s TAPSE (M-mode): 2.5 cm LEFT ATRIUM             Index        RIGHT ATRIUM           Index LA diam:        5.10 cm 2.34 cm/m   RA Area:     16.30 cm LA Vol (A2C):   73.4 ml 33.68 ml/m  RA Volume:   47.10 ml  21.61 ml/m LA Vol (A4C):   68.5 ml 31.43 ml/m LA Biplane Vol: 73.6 ml 33.77 ml/m  AORTIC VALVE AV Area (Vmax):    1.34 cm AV Area (Vmean):   1.25 cm AV Area (VTI):     1.16 cm AV Vmax:           221.00 cm/s AV Vmean:          157.800 cm/s AV VTI:            0.513 m AV Peak Grad:      19.5 mmHg AV Mean Grad:      11.2 mmHg LVOT Vmax:         77.70 cm/s LVOT Vmean:        51.900 cm/s LVOT VTI:          0.156 m LVOT/AV VTI ratio: 0.30  AORTA Ao Root diam: 3.40 cm Ao Asc diam:  3.30 cm MITRAL VALVE MV Area (PHT): 4.06 cm     SHUNTS MV Decel Time: 187 msec     Systemic VTI:  0.16 m MV E velocity: 106.00 cm/s  Systemic Diam: 2.20 cm MV A velocity: 121.33 cm/s MV E/A ratio:  0.87 Antionette Kirks MD Electronically signed by Antionette Kirks MD Signature Date/Time: 01/18/2024/11:05:35 AM    Final    CT Angio Chest Pulmonary Embolism (PE) W or WO Contrast Result Date: 01/17/2024 CLINICAL DATA:  Chest pain and shortness of breath. EXAM: CT ANGIOGRAPHY CHEST WITH CONTRAST TECHNIQUE: Multidetector CT imaging of the chest was performed using the standard protocol during bolus administration of intravenous contrast. Multiplanar CT image reconstructions and MIPs were obtained to evaluate the vascular anatomy.  RADIATION DOSE REDUCTION: This exam was performed according to the departmental dose-optimization program which includes automated exposure control, adjustment of the mA and/or kV according to  patient size and/or use of iterative reconstruction technique. CONTRAST:  75mL OMNIPAQUE  IOHEXOL  350 MG/ML SOLN COMPARISON:  Radiograph earlier today.  Chest CT 04/20/2021 FINDINGS: Cardiovascular: There are no filling defects within the pulmonary arteries to suggest pulmonary embolus. The heart is mildly enlarged. Aortic atherosclerosis. There is no contrast in the aorta. No pericardial effusion. Status post CABG with calcification of native coronary arteries. Mediastinum/Nodes: No suspicious mediastinal or hilar lymphadenopathy. Unremarkable appearance of the esophagus. No thyroid  nodule. Lungs/Pleura: Emphysema with bronchial thickening. Patchy areas of ground-glass opacity within the lower lobes, right middle lobe and lingula. This is slightly more prominent than on prior exam. No confluent airspace disease. No pleural fluid. Upper Abdomen: No acute upper abdominal findings. Musculoskeletal: Reverse right shoulder arthroplasty. Median sternotomy. Review of the MIP images confirms the above findings. IMPRESSION: 1. No pulmonary embolus. 2. Patchy areas of ground-glass opacity within the lower lobes, right middle lobe and lingula, slightly more prominent than on prior exam. This is nonspecific, but can be seen with atypical infection or inflammation. This is likely superimposed on scarring. Aortic Atherosclerosis (ICD10-I70.0) and Emphysema (ICD10-J43.9). Electronically Signed   By: Chadwick Colonel M.D.   On: 01/17/2024 22:28   DG Chest 2 View Result Date: 01/17/2024 CLINICAL DATA:  Chest pain. EXAM: CHEST - 2 VIEW COMPARISON:  October 24, 2023. FINDINGS: The heart size and mediastinal contours are within normal limits. Both lungs are clear. Status post coronary artery bypass graft and right shoulder arthroplasty.  IMPRESSION: No active cardiopulmonary disease. Electronically Signed   By: Rosalene Colon M.D.   On: 01/17/2024 12:28    Microbiology: No results found for this or any previous visit (from the past 240 hours).   Labs: Basic Metabolic Panel: Recent Labs  Lab 01/17/24 1156 01/18/24 0217 01/19/24 0046  NA 138 139 139  K 4.2 4.6 3.7  CL 106 105 104  CO2 22 25 26   GLUCOSE 124* 161* 122*  BUN 32* 38* 40*  CREATININE 1.42* 1.50* 1.46*  CALCIUM  8.8* 8.7* 8.5*   Liver Function Tests: No results for input(s): "AST", "ALT", "ALKPHOS", "BILITOT", "PROT", "ALBUMIN " in the last 168 hours. No results for input(s): "LIPASE", "AMYLASE" in the last 168 hours. No results for input(s): "AMMONIA" in the last 168 hours. CBC: Recent Labs  Lab 01/17/24 1156 01/18/24 0217 01/19/24 0046  WBC 8.1 6.3 6.0  HGB 11.4* 11.3* 11.5*  HCT 34.5* 34.4* 34.8*  MCV 97.5 96.6 96.7  PLT 191 182 160   Cardiac Enzymes: No results for input(s): "CKTOTAL", "CKMB", "CKMBINDEX", "TROPONINI" in the last 168 hours. BNP: BNP (last 3 results) Recent Labs    01/17/24 1156  BNP 535.6*    ProBNP (last 3 results) Recent Labs    12/28/23 1601  PROBNP 329.0*    CBG: Recent Labs  Lab 01/18/24 1325 01/18/24 1602 01/18/24 2157 01/19/24 0707 01/19/24 1123  GLUCAP 135* 172* 111* 124* 157*       Signed:  Raymonde Calico MD.  [facility] 01/19/2024, 1:21 PM

## 2024-01-22 ENCOUNTER — Encounter: Payer: Self-pay | Admitting: Pulmonary Disease

## 2024-01-22 ENCOUNTER — Encounter: Payer: Self-pay | Admitting: Urology

## 2024-01-22 ENCOUNTER — Encounter: Payer: Self-pay | Admitting: Cardiovascular Disease

## 2024-01-22 NOTE — Telephone Encounter (Signed)
 He needs to let Dr. Nolan Battle know ASAP.  There are other beta-blockers that can be used that are more "selective" and may not cause as much of an issue.  But Dr. Nolan Battle needs to make that change.

## 2024-01-22 NOTE — Telephone Encounter (Signed)
 I spoke with [name] by phone regarding his concerns about carvedilol .  He notes that he has felt tired and weak for about a year when carvedilol  was first added.  It seems to have gotten worse recently and has been quite significant since his hospitalization last week when carvedilol  was doubled to 6.25 mg twice daily.  We discussed that his symptoms are likely multifactorial, though given his history of obstructive lung disease, beta-blockers like carvedilol  can sometimes worsen dyspnea.  He has already decreased carvedilol  back to 3.125 mg this morning.  I have advised him to take his evening dose of carvedilol  3.125 mg tonight and then to hold carvedilol  going forward.  He will send us  a message through MyChart on Friday, updating us  on his symptoms.  If symptoms worsen in the meantime, he was instructed to contact us  right away for further evaluation.  He is in agreement with this plan.  He has follow-up with [name] Cockayne, NP, next week as well.  [name], MD [facility]

## 2024-01-23 ENCOUNTER — Ambulatory Visit: Payer: Self-pay | Admitting: Urology

## 2024-01-23 ENCOUNTER — Ambulatory Visit: Attending: Orthopedic Surgery

## 2024-01-23 DIAGNOSIS — M25611 Stiffness of right shoulder, not elsewhere classified: Secondary | ICD-10-CM | POA: Insufficient documentation

## 2024-01-23 DIAGNOSIS — C679 Malignant neoplasm of bladder, unspecified: Secondary | ICD-10-CM

## 2024-01-23 NOTE — Therapy (Signed)
 OUTPATIENT PHYSICAL THERAPY EVALUATION   Patient Name: [name] MRN: [id number] DOB:[birth year], 83 y.o., male Today's Date: 01/23/2024  END OF SESSION:  PT End of Session - 01/23/24 1343     Visit Number 1    Number of Visits 16    Date for PT Re-Evaluation 03/19/24    Authorization Type HTA    Authorization Time Period 01/23/24-03/19/24    Progress Note Due on Visit 10    PT Start Time 1300    PT Stop Time 1340    PT Time Calculation (min) 40 min    Activity Tolerance Patient tolerated treatment well;Patient limited by pain    Behavior During Therapy [facility] for tasks assessed/performed             Past Medical History:  Diagnosis Date   Actinic keratosis    Anginal pain (HCC)    Anxiety    Aortic ectasia, abdominal (HCC)    a.) CT abd 01/01/2022: 2.9 cm infrarenal abdominal aorta   Aortic stenosis    Bladder cancer (HCC) 2023   BPH (benign prostatic hyperplasia) 09/06/2007   CAD s/p CABG    a.) 1990 s/p MI--> CABG x 3; b.) LHC/PCI 2002 --> BMS (unk type) to LCx   Carotid arterial disease (HCC)    a.) 08/2016 Carotid U/S: <39% bilat; b.) carotid doppler 10/12/2022: mild-to-moderate (<50%) bilateral bifurcation plaque.   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 06/2019 Echo (in setting of COVID): EF 35-40%, mild LVH, g1 DD, glob HK; b. 09/2019 Echo: EF  50-55%, Gr1 DD; c. 03/2021 Echo: EF 50-55%, no rwma, mild LVH, GrI DD, nl RV fxn. RVSP 44.35mmHg. Mild-mod dil LA. Triv MR. Mild-mod Ao sclerosis.   History of 2019 novel coronavirus disease (COVID-19) 05/20/2019   History of hiatal hernia    History of shingles    HLD (hyperlipidemia) 04/26/2007   HTN (hypertension) 04/30/2007   LBBB (left bundle branch block)    Long-term use of aspirin  therapy    Lumbar disc disease with radiculopathy    Migraines    Myocardial infarction (HCC) 1990   a.) resulted in 3v CABG   Myocardial infarction (HCC) 2002   a.) second cardiac event; PCI with placement of BMS (unknown type) to LCx   Neuropathy of both feet    OSA on CPAP    Osteoarthritis    PAD (peripheral artery disease) (HCC)    a. 07/2017 LE duplex: RSFA 75-67m, LSFA 75-50m, 50-74d; c. 08/2018 Periph Angio: No signif AoIliac dzs. Mod-sev Ca2+ RSFA w/ diff dzs throughout- 3 vessel runoff. Borderline signif LSFA dzs w/ mod-sev Ca2+ vessels and 3 vessel runoff below the knee-->Med rx.   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   Right mid dorsum forearm - in situ - EDC   T2DM (type 2 diabetes mellitus) (HCC)    Traumatic open wound of lower leg with delayed healing, left 12/28/2022   Past Surgical History:  Procedure Laterality Date   ABDOMINAL AORTOGRAM W/LOWER EXTREMITY [name] 09/12/2018   Procedure: ABDOMINAL AORTOGRAM W/LOWER EXTREMITY;  Surgeon: Wenona Hamilton, MD;  Location: [facility];  Service: Cardiovascular;  Laterality: [name];   ANTERIOR CERVICAL DECOMP/DISCECTOMY FUSION [name] 07/11/2022   Anterior Cervical Decompression Fusion ,Interboy Prothesis,Plate/Screws , Cervical four-five,Cervical  five-six Larrie Po, Susana Enter, MD)   APPENDECTOMY     rupture   BACK SURGERY     1984 and then another one [facility]   BLADDER INSTILLATION [name] 10/19/2021   Procedure: BLADDER INSTILLATION OF GEMCITABINE ;  Surgeon: Geraline Knapp, MD;  Location: [facility];  Service: Urology;  Laterality: [name];   BLADDER INSTILLATION [name] 11/15/2022   Procedure: BLADDER INSTILLATION OF GEMCITABINE ;  Surgeon: Geraline Knapp, MD;  Location: [facility];  Service: Urology;  Laterality: [name];   CARDIAC CATHETERIZATION [name] 07/07/2015   Procedure: Left Heart Cath and Cors/Grafts Angiography;  Surgeon: Devorah Fonder, MD;  Location: [facility];  Service: Cardiovascular;  Laterality: [name];   CATARACT EXTRACTION, BILATERAL  2009   CERVICAL SPINE SURGERY  12/2016   cervical stenosis Larrie Po)   COLONOSCOPY  03/2010   HP polyp, diverticulosis, rpt 10 yrs ([name])   CORONARY ANGIOPLASTY  11/30/2000   LCX   CORONARY ARTERY BYPASS GRAFT  1990   3 vessel    CORONARY STENT INTERVENTION [name] 01/13/2023   Procedure: CORONARY STENT INTERVENTION;  Surgeon: Wenona Hamilton, MD;  Location: [facility];  Service: Cardiovascular;  Laterality: [name];   CYSTOSCOPY  12/23/2010   [facility]   CYSTOSCOPY WITH BIOPSY [name] 01/16/2024   Procedure: CYSTOSCOPY, WITH BIOPSY;  Surgeon: Geraline Knapp, MD;  Location: [facility];  Service: Urology;  Laterality: [name];   KNEE ARTHROSCOPY Right    x2   LAMINOTOMY  1986   L5/S1 lumbar laminotomy for two ruptured discs/fusion   LEFT HEART CATH AND CORS/GRAFTS ANGIOGRAPHY [name] 01/19/2024   Procedure: LEFT HEART CATH AND CORS/GRAFTS ANGIOGRAPHY;  Surgeon: [name], MD;  Location: [facility];  Service: Cardiovascular;  Laterality: [name];   LUMBAR LAMINECTOMY/DECOMPRESSION MICRODISCECTOMY [name] 07/13/2016   Procedure: LUMBAR TWO-THREE, LUMBAR THREE-FOUR, LUMBAR FOUR-FIVE LAMINECTOMY AND FORAMINOTOMY;  Surgeon: Garry Kansas, MD;  Location: [facility];  Service: Neurosurgery;  Laterality:  [name];  LAMINECTOMY AND FORAMINOTOMY L2-L3, L3-L4,L4-L5   LUMBAR SPINE SURGERY  06/2020   Bilateral redo laminectomy/laminotomy/foraminotomies/medial facetectomy to decompress the bilateral L4 and L5 nerve roots Larrie Po)   REVERSE SHOULDER ARTHROPLASTY Right 12/21/2023   Procedure: ARTHROPLASTY, SHOULDER, TOTAL, REVERSE;  Surgeon: Ellard Gunning, MD;  Location: [facility];  Service: Orthopedics;  Laterality: Right;   RIGHT/LEFT HEART CATH AND CORONARY ANGIOGRAPHY Bilateral 01/13/2023   Procedure: RIGHT/LEFT HEART CATH AND CORONARY ANGIOGRAPHY;  Surgeon: Wenona Hamilton, MD;  Location: [facility];  Service: Cardiovascular;  Laterality: Bilateral;   TOTAL HIP ARTHROPLASTY Bilateral 1999,2000   TOTAL KNEE ARTHROPLASTY Right 03/06/2017   Procedure: RIGHT TOTAL KNEE ARTHROPLASTY;  Surgeon: Liliane Rei, MD;  Location: [facility];  Service: Orthopedics;  Laterality: Right;  TRANSURETHRAL RESECTION OF BLADDER TUMOR [name] 10/19/2021   Procedure: TRANSURETHRAL RESECTION OF BLADDER TUMOR (TURBT);  Surgeon: Geraline Knapp, MD;  Location: [facility];  Service: Urology;  Laterality: [name];   TRANSURETHRAL RESECTION OF BLADDER TUMOR [name] 11/15/2022   Procedure: TRANSURETHRAL RESECTION OF BLADDER TUMOR (TURBT);  Surgeon: Geraline Knapp, MD;  Location: [facility];  Service: Urology;  Laterality: [name];   Patient Active Problem List   Diagnosis Date Noted   HFrEF (heart failure with reduced ejection fraction) (HCC) 01/05/2024   Urinary incontinence 01/05/2024   Rotator cuff tear arthropathy of right shoulder 09/08/2023   Sinus headache 08/02/2023   Bradycardia 08/02/2023   Abnormal stress test 01/14/2023   Angina pectoris (HCC) 01/13/2023   Retinal artery occlusion, branch, 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   Cervical spondylosis with radiculopathy 07/11/2022   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   OSA (obstructive sleep apnea)    Chronic heart failure with preserved ejection fraction (HFpEF) (HCC)    Nocturnal  hypoxia 06/11/2021   CKD (chronic kidney disease) stage 3, GFR 30-59 ml/min (HCC) 02/24/2021   Peripheral neuropathy 02/23/2021   Spondylolisthesis of lumbar region 07/07/2020   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   Coccydynia 09/20/2019   Nasal sinus congestion 07/19/2019   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   Anxiety 06/04/2019   Chronic right shoulder pain 09/10/2018   Cervical stenosis of spinal canal 06/05/2017   Peripheral edema 06/05/2017   Health maintenance examination 12/06/2016   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/13/2016   Overweight (BMI 25.0-29.9) 07/04/2016   Low vitamin B12 level 01/01/2016   Exertional dyspnea 07/07/2015   Unstable angina (HCC)    Medicare annual wellness visit, subsequent 07/03/2015   Advanced care planning/counseling discussion 07/03/2015   Type 2 diabetes mellitus with other specified complication (HCC) 05/04/2015   Pleuritic chest pain 05/04/2015   Ex-smoker 05/04/2015   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   Spermatocele 04/02/2013   Syncopal vertigo 11/04/2010   Lumbar disc disease with radiculopathy 11/04/2010   Coronary artery disease involving native coronary artery of native heart with unstable angina pectoris (HCC) 08/11/2009   PAD (peripheral artery disease) (HCC) 08/11/2009   DUPUYTREN'S CONTRACTURE, RIGHT 10/29/2008   Carotid stenosis 07/09/2008   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   Benign prostatic hyperplasia with urinary obstruction 09/06/2007   Essential hypertension 04/30/2007   GERD 04/30/2007   Hyperlipidemia associated with type 2 diabetes mellitus (HCC) 04/26/2007   OA (osteoarthritis) of knee 04/26/2007   DVT, HX OF 04/26/2007    PCP: Claire Crick, MD REFERRING PROVIDER: Ellard Gunning, MD (ortho)  REFERRING DIAG: s/p Rt rTSA  THERAPY DIAG:  Stiffness of right shoulder, not elsewhere classified Rationale for Evaluation and Treatment: Rehabilitation ONSET DATE:    SUBJECTIVE:  SUBJECTIVE STATEMENT: Pt had reverse TSA Right about a week ago, but feels crumby today from recent hospitalization. Shoulder has been going well.   Hand dominance: right   PERTINENT HISTORY: Reverse TSA wiith Dr Alfredo Ano 2nd week of May 2025. Pt wore sling x 2 weeks, then DC. Pt repors use of polar care, also says he's been moving his arm around a lot and its doing well. Pain controlled. Pt not aware of anti extension, anti IR precautions, but reports he would never do these anyway. Pt just DC from hospital 5 days prior to evaluation here after cardiac chest pain problem, underwent left radial cath, was on heaprin x3 days, now back home.   PAIN:  Are you having pain? No  PRECAUTIONS: No shoulder extension, no IR+ADD shoulder right   WEIGHT BEARING RESTRICTIONS: No lifting, no body weight bearing   FALLS:  Has patient fallen in last 6 months? No  PATIENT GOALS: improved arm function   NEXT MD VISIT: 2nd week June 2025  OBJECTIVE:  Note: Objective measures were completed at Evaluation unless otherwise noted.  PATIENT SURVEYS :  QUICK DASH to be issued visit 2   UPPER EXTREMITY ROM:    ROM Right eval Left eval  Shoulder flexion  90 degrees   Shoulder extension  prohibited  Shoulder abduction  45 degrees  Shoulder internal rotation  Full   Shoulder external rotation  -20 degrees  Elbow flexion  Full   Elbow extension  Full   (Blank rows = not tested)  PALPATION:  General anterior upper chest tightness and focal edema near surgical scar and pec major  TODAY'S TREATMENT 01/23/24   -review of HEP from postop education: very limited in tolerance in trunk/back/hip flexion from prior surgical problems hence limited in AA/ROM of shoulder  -Supine RIght P/ROM Shoulder flexion 0-100  degrees 20x5secH  -Supine Right P/ROM Shoulder ABDCT 15x5secH (very pain limited at end range  -Supine Right P/ROM Shoulder ER -30 to end range (-20) with sustained release of pec major fibers  -Education on precuations against weight bearing, A/ROM, extension, IR+Add     PATIENT EDUCATION: Education details: See above  Person educated: Runner, broadcasting/film/video: collaborative learning, deliberate practice, positive reinforcement, explicit instruction, establish rules. Education comprehension: fair   HOME EXERCISE PROGRAM: Access Code: [email or web address] URL: [email or web address] Date: 01/23/2024 Prepared by: Atlee Blanks  Exercises - Seated Shoulder Abduction Towel Slide at Table Top  - 5 x daily - 1 sets - 20 reps - Seated Bilateral Shoulder Flexion Towel Slide at Table Top  - 5 x daily - 1 sets - 20 reps - Seated Scapular Retraction  - 5 x daily - 1 sets - 15 reps - 3sec hold  ASSESSMENT:  CLINICAL IMPRESSION: 82yoM evaluated today 4 weeks post Rt reverse TSA. Pain is at goal or better. Pt DC sling 2 weeks ago as directed. ROM remains restricted and painful in flexion, ABDCT, and ER. Pt has been performing A/ROM of RUE ad lib without any knowledge of restrictions. Pt will benefit from skilled PT intervention to improve ROM and strength in RUE in order to restore to PLOF in ADL, IADL, and leisure activity.   OBJECTIVE IMPAIRMENTS: decreased activity tolerance, decreased knowledge of condition, decreased knowledge of use of DME, decreased mobility, decreased ROM, and decreased strength.  ACTIVITY LIMITATIONS: carrying, lifting, sleeping, bathing, toileting, dressing, and reach over head PARTICIPATION LIMITATIONS: meal prep, cleaning, laundry, medication management, interpersonal relationship, driving, shopping, and community activity PERSONAL FACTORS: Age, Behavior pattern, Education,  Fitness, Past/current experiences, Profession, and Social background are also affecting patient's  functional outcome.  REHAB POTENTIAL: Fair   CLINICAL DECISION MAKING: Medium  EVALUATION COMPLEXITY: Moderate  GOALS: Goals reviewed with patient? No  SHORT TERM GOALS: Target date: 02/22/24  Patient will report comprehension, confidence, and consistent compliance and of a simple home exercise program established to facilitate symptoms management and basic strengthening and/or segment mobility.   Baseline: Goal status: New   2.  Patient to demonstrate improved score on self-report measure by >9% to indicate reduced self-reported disability and/or pain.    Baseline:  Quick DASH issued on visit 2 Goal status: New  LONG TERM GOALS: Target date: 03/19/24  Patient to improve score on self-report measure by 20% or greater to indicate reduced disability and improved quality of life.   Baseline: Quickash pending Goal status: New  2.  Patient to demonstrate improvement in strength testing by minimum of 1 grade to indicate improved motor performance.   Baseline: 3/5 or less all groups  Goal status: New  3.  Patient to demonstrate improvement in RUE shoulder A/ROM without pain limitations >100 degrees flexion, 75 degrees ABDCT. Baseline:  Goal status: NEW  PLAN: PT FREQUENCY: 1-2x/week  PT DURATION: 8 weeks  PLANNED INTERVENTIONS: 97750- Physical Performance Testing, 97110-Therapeutic exercises, 97530- Therapeutic activity, 97112- Neuromuscular re-education, 97535- Self Care, 24401- Manual therapy, [id number]- Electrical stimulation (unattended), [phone]- Electrical stimulation (manual), Patient/Family education, Cryotherapy, and Moist heat  PLAN FOR NEXT SESSION: Review HEP; P/ROM, teach L stretch at kitchen counter  5:42 PM, 01/23/24 Dawn Eth, PT, DPT Physical Therapist - [place] [phone] (Office)    [place] C, PT 01/23/2024, 3:28 PM

## 2024-01-23 NOTE — Telephone Encounter (Signed)
 I contacted [name] to discuss his bladder pathology.  Noted to have 2 recurrent flat papillary lesions with biopsy showing high-grade dysplasia/carcinoma in situ.  He has recurred within 1 year of 2 induction courses of BCG + maintenance BCG.  Recommend appointment with Dr. Alita Irwin to discuss alternate intravesical therapies as he has significant medical comorbidities for radical cystectomy.  He was agreeable to referral.

## 2024-01-25 ENCOUNTER — Ambulatory Visit

## 2024-01-25 DIAGNOSIS — M25611 Stiffness of right shoulder, not elsewhere classified: Secondary | ICD-10-CM

## 2024-01-25 NOTE — Therapy (Signed)
 OUTPATIENT PHYSICAL THERAPY TREATMENT  Patient Name: [name] MRN: [id number] DOB:01-Nov-1940, 83 y.o., male Today's Date: 01/25/2024  END OF SESSION:  PT End of Session - 01/25/24 1123     Visit Number 2    Number of Visits 16    Date for PT Re-Evaluation 03/19/24    Authorization Type HTA    Authorization Time Period 01/23/24-03/19/24    Progress Note Due on Visit 10    PT Start Time 1120    PT Stop Time 1200    PT Time Calculation (min) 40 min    Activity Tolerance Patient tolerated treatment well;Patient limited by pain;No increased pain    Behavior During Therapy [facility] for tasks assessed/performed          Past Medical History:  Diagnosis Date   Actinic keratosis    Anginal pain (HCC)    Anxiety    Aortic ectasia, abdominal (HCC)    a.) CT abd 01/01/2022: 2.9 cm infrarenal abdominal aorta   Aortic stenosis    Bladder cancer (HCC) 2023   BPH (benign prostatic hyperplasia) 09/06/2007   CAD s/p CABG    a.) 1990 s/p MI--> CABG x 3; b.) LHC/PCI 2002 --> BMS (unk type) to LCx   Carotid arterial disease (HCC)    a.) 08/2016 Carotid U/S: <39% bilat; b.) carotid doppler 10/12/2022: mild-to-moderate (<50%) bilateral bifurcation plaque.   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 06/2019 Echo (in setting of COVID): EF 35-40%, mild LVH, g1 DD, glob HK; b. 09/2019  Echo: EF 50-55%, Gr1 DD; c. 03/2021 Echo: EF 50-55%, no rwma, mild LVH, GrI DD, nl RV fxn. RVSP 44.1mmHg. Mild-mod dil LA. Triv MR. Mild-mod Ao sclerosis.   History of 2019 novel coronavirus disease (COVID-19) 05/20/2019   History of hiatal hernia    History of shingles    HLD (hyperlipidemia) 04/26/2007   HTN (hypertension) 04/30/2007   LBBB (left bundle branch block)    Long-term use of aspirin  therapy    Lumbar disc disease with radiculopathy    Migraines    Myocardial infarction (HCC) 1990   a.) resulted in 3v CABG   Myocardial infarction (HCC) 2002   a.) second cardiac event; PCI with placement of BMS (unknown type) to LCx   Neuropathy of both feet    OSA on CPAP    Osteoarthritis    PAD (peripheral artery disease) (HCC)    a. 07/2017 LE duplex: RSFA 75-92m, LSFA 75-46m, 50-74d; c. 08/2018 Periph Angio: No signif AoIliac dzs. Mod-sev Ca2+ RSFA w/ diff dzs throughout- 3 vessel runoff. Borderline signif LSFA dzs w/ mod-sev Ca2+ vessels and 3 vessel runoff below the knee-->Med rx.   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   Right mid dorsum forearm - in situ - EDC   T2DM (type 2 diabetes mellitus) (HCC)    Traumatic open wound of lower leg with delayed healing, left 12/28/2022   Past Surgical History:  Procedure Laterality Date   ABDOMINAL AORTOGRAM W/LOWER EXTREMITY [name] 09/12/2018   Procedure: ABDOMINAL AORTOGRAM W/LOWER EXTREMITY;  Surgeon: Wenona Hamilton, MD;  Location: [facility];  Service: Cardiovascular;  Laterality: [name];   ANTERIOR CERVICAL DECOMP/DISCECTOMY FUSION [name] 07/11/2022   Anterior Cervical Decompression Fusion ,Interboy Prothesis,Plate/Screws , Cervical  four-five,Cervical five-six Larrie Po, Susana Enter, MD)   APPENDECTOMY     rupture   BACK SURGERY     1984 and then another one [facility]   BLADDER INSTILLATION [name] 10/19/2021   Procedure: BLADDER INSTILLATION OF GEMCITABINE ;  Surgeon: Geraline Knapp, MD;  Location: [facility];  Service: Urology;  Laterality: [name];   BLADDER INSTILLATION [name] 11/15/2022   Procedure: BLADDER INSTILLATION OF GEMCITABINE ;  Surgeon: Geraline Knapp, MD;  Location: [facility];  Service: Urology;  Laterality: [name];   CARDIAC CATHETERIZATION [name] 07/07/2015   Procedure: Left Heart Cath and Cors/Grafts Angiography;  Surgeon: Devorah Fonder, MD;  Location: [facility];  Service: Cardiovascular;  Laterality: [name];   CATARACT EXTRACTION, BILATERAL  2009   CERVICAL SPINE SURGERY  12/2016   cervical stenosis Larrie Po)   COLONOSCOPY  03/2010   HP polyp, diverticulosis, rpt 10 yrs ([name])   CORONARY ANGIOPLASTY  11/30/2000   LCX   CORONARY ARTERY BYPASS GRAFT  1990   3 vessel    CORONARY STENT INTERVENTION [name] 01/13/2023   Procedure: CORONARY STENT INTERVENTION;  Surgeon: Wenona Hamilton, MD;  Location: [facility];  Service: Cardiovascular;  Laterality: [name];   CYSTOSCOPY  12/23/2010   [facility]   CYSTOSCOPY WITH BIOPSY [name] 01/16/2024   Procedure: CYSTOSCOPY, WITH BIOPSY;  Surgeon: Geraline Knapp, MD;  Location: [facility];  Service: Urology;  Laterality: [name];   KNEE ARTHROSCOPY Right    x2   LAMINOTOMY  1986   L5/S1 lumbar laminotomy for two ruptured discs/fusion   LEFT HEART CATH AND CORS/GRAFTS ANGIOGRAPHY [name] 01/19/2024   Procedure: LEFT HEART CATH AND CORS/GRAFTS ANGIOGRAPHY;  Surgeon: [name], MD;  Location: [facility];  Service: Cardiovascular;  Laterality: [name];   LUMBAR LAMINECTOMY/DECOMPRESSION MICRODISCECTOMY [name] 07/13/2016   Procedure: LUMBAR TWO-THREE, LUMBAR THREE-FOUR, LUMBAR FOUR-FIVE LAMINECTOMY AND FORAMINOTOMY;  Surgeon: Garry Kansas, MD;  Location: [facility];  Service:  Neurosurgery;  Laterality: [name];  LAMINECTOMY AND FORAMINOTOMY L2-L3, L3-L4,L4-L5   LUMBAR SPINE SURGERY  06/2020   Bilateral redo laminectomy/laminotomy/foraminotomies/medial facetectomy to decompress the bilateral L4 and L5 nerve roots Larrie Po)   REVERSE SHOULDER ARTHROPLASTY Right 12/21/2023   Procedure: ARTHROPLASTY, SHOULDER, TOTAL, REVERSE;  Surgeon: Ellard Gunning, MD;  Location: [facility];  Service: Orthopedics;  Laterality: Right;   RIGHT/LEFT HEART CATH AND CORONARY ANGIOGRAPHY Bilateral 01/13/2023   Procedure: RIGHT/LEFT HEART CATH AND CORONARY ANGIOGRAPHY;  Surgeon: Wenona Hamilton, MD;  Location: [facility];  Service: Cardiovascular;  Laterality: Bilateral;   TOTAL HIP ARTHROPLASTY Bilateral 1999,2000   TOTAL KNEE ARTHROPLASTY Right 03/06/2017   Procedure: RIGHT TOTAL KNEE ARTHROPLASTY;  Surgeon: Liliane Rei, MD;  Location: [facility];  Service: Orthopedics;  Laterality: Right;  TRANSURETHRAL RESECTION OF BLADDER TUMOR [name] 10/19/2021   Procedure: TRANSURETHRAL RESECTION OF BLADDER TUMOR (TURBT);  Surgeon: Geraline Knapp, MD;  Location: [facility];  Service: Urology;  Laterality: [name];   TRANSURETHRAL RESECTION OF BLADDER TUMOR [name] 11/15/2022   Procedure: TRANSURETHRAL RESECTION OF BLADDER TUMOR (TURBT);  Surgeon: Geraline Knapp, MD;  Location: [facility];  Service: Urology;  Laterality: [name];   Patient Active Problem List   Diagnosis Date Noted   HFrEF (heart failure with reduced ejection fraction) (HCC) 01/05/2024   Urinary incontinence 01/05/2024   Rotator cuff tear arthropathy of right shoulder 09/08/2023   Sinus headache 08/02/2023   Bradycardia 08/02/2023   Abnormal stress test 01/14/2023   Angina pectoris (HCC) 01/13/2023   Retinal artery occlusion, branch, 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   Cervical spondylosis with radiculopathy 07/11/2022   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   OSA (obstructive sleep apnea)    Chronic heart failure with preserved ejection fraction  (HFpEF) (HCC)    Nocturnal hypoxia 06/11/2021   CKD (chronic kidney disease) stage 3, GFR 30-59 ml/min (HCC) 02/24/2021   Peripheral neuropathy 02/23/2021   Spondylolisthesis of lumbar region 07/07/2020   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   Coccydynia 09/20/2019   Nasal sinus congestion 07/19/2019   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   Anxiety 06/04/2019   Chronic right shoulder pain 09/10/2018   Cervical stenosis of spinal canal 06/05/2017   Peripheral edema 06/05/2017   Health maintenance examination 12/06/2016   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/13/2016   Overweight (BMI 25.0-29.9) 07/04/2016   Low vitamin B12 level 01/01/2016   Exertional dyspnea 07/07/2015   Unstable angina (HCC)    Medicare annual wellness visit, subsequent 07/03/2015   Advanced care planning/counseling discussion 07/03/2015   Type 2 diabetes mellitus with other specified complication (HCC) 05/04/2015   Pleuritic chest pain 05/04/2015   Ex-smoker 05/04/2015   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   Spermatocele 04/02/2013   Syncopal vertigo 11/04/2010   Lumbar disc disease with radiculopathy 11/04/2010   Coronary artery disease involving native coronary artery of native heart with unstable angina pectoris (HCC) 08/11/2009   PAD (peripheral artery disease) (HCC) 08/11/2009   DUPUYTREN'S CONTRACTURE, RIGHT 10/29/2008   Carotid stenosis 07/09/2008   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 Pain remains at goal. He continues to look for safe ways to work on moving his arm around.   Hand dominance: right  PERTINENT HISTORY: Reverse TSA wiith Dr Alfredo Ano 2nd week of May 2025. Pt wore sling x 2 weeks, then DC. Pt repors use of polar care, also says he's been moving his arm around a lot and its doing well. Pain controlled. Pt not aware of anti extension, anti IR precautions, but reports he would never do these anyway. Pt just DC from hospital 5 days prior to evaluation here after cardiac chest pain problem, underwent left radial cath, was on heaprin x3 days, now back home.  PAIN:  Are you having pain? No PRECAUTIONS: No shoulder extension, no IR+ADD shoulder right  WEIGHT BEARING RESTRICTIONS: No lifting, no body weight bearing  FALLS:  Has patient fallen in last 6 months? No PATIENT GOALS: improved arm function   NEXT MD VISIT: 2nd week June 2025  OBJECTIVE:  Note: Objective measures were completed at Evaluation unless otherwise noted.  PATIENT SURVEYS :  QUIcK DASH: 70.5% (01/25/24)   UPPER EXTREMITY ROM:    ROM Right eval Right  01/25/24  Shoulder flexion 90 degrees  108 degrees  Shoulder extension prohibited --  Shoulder abduction 45 degrees 78 degrees  Shoulder internal rotation Full  --  Shoulder external rotation -20 degrees --  Elbow flexion Full  Full  Elbow extension Full  Full   (Blank rows = not tested)  PALPATION:  General anterior upper chest tightness and focal edema near surgical scar and pec major  TODAY'S TREATMENT 01/25/24   -blue jumbo ball rollouts, flexion ROM x20, then RUE scaption 1x20, then TRX ABDCT 10x10sec  -stairs banister towel slide RUE 15x5secH (up to 108  degrees)  -Rt elbow flexion A/ROM 2x15  -Rt elbow extension ROM 2x15 (blue thera tube resistance (very very light)  -QUIcK DASH: 70.5% (01/25/24)     PATIENT EDUCATION: Education details: See above  Person educated: Runner, broadcasting/film/video: collaborative learning, deliberate practice, positive reinforcement, explicit instruction, establish rules. Education comprehension: fair   HOME EXERCISE PROGRAM: Access Code: [email or web address] URL: [email or web address] Date: 01/25/2024 Prepared by: Atlee Blanks  Exercises - Seated Shoulder Abduction Towel Slide at Table Top  - 5 x daily - 1 sets - 20 reps - Seated Bilateral Shoulder Flexion Towel Slide at Table Top  - 5 x daily - 1 sets - 20 reps - Seated Scapular Retraction  - 5 x daily - 1 sets - 15 reps - 3sec hold - Standing 'L' Stretch at Counter  - 5 x daily - 15 reps - 5sec hold (visit 2 addition)   ASSESSMENT:  CLINICAL IMPRESSION: Pt continues to have adeuqate pain control. Pain during ROM of ABDCT 75% improved today and ROM roughly the same. Flexion ROM improved overall by ~ 10 degrees since eval. Updated HEP stretches for patient. A/ROM of hand wrist and elbow is WNL. Pt is currently independent and at goal with his post surgical protocol restrictions, hence we will allow him to work on these at home independently as he has been and return in 1 week. Pt will benefit from skilled PT intervention to improve ROM and strength in RUE in order to restore to PLOF in ADL, IADL, and leisure activity.   OBJECTIVE IMPAIRMENTS: decreased activity tolerance, decreased knowledge of condition, decreased knowledge of use of DME, decreased mobility, decreased ROM, and decreased strength.  ACTIVITY LIMITATIONS: carrying, lifting, sleeping, bathing,  toileting, dressing, and reach over head PARTICIPATION LIMITATIONS: meal prep, cleaning, laundry, medication management, interpersonal relationship, driving, shopping, and community activity PERSONAL  FACTORS: Age, Behavior pattern, Education, Fitness, Past/current experiences, Profession, and Social background are also affecting patient's functional outcome.  REHAB POTENTIAL: Fair   CLINICAL DECISION MAKING: Medium  EVALUATION COMPLEXITY: Moderate  GOALS: Goals reviewed with patient? No  SHORT TERM GOALS: Target date: 02/22/24  Patient will report comprehension, confidence, and consistent compliance and of a simple home exercise program established to facilitate symptoms management and basic strengthening and/or segment mobility.   Baseline: issued at visit 1 and 2  Goal status: MET    2.  Patient to demonstrate improved score on self-report measure by >9% to indicate reduced self-reported disability and/or pain.    Baseline:  Quick DASH 70.5% Goal status: Progressing   LONG TERM GOALS: Target date: 03/19/24  Patient to improve score on self-report measure by 20% or greater to indicate reduced disability and improved quality of life.   Baseline: Quickash 70.5% Goal status: Progressing   2.  Patient to demonstrate improvement in strength testing by minimum of 1 grade to indicate improved motor performance.   Baseline: 3/5 or less all groups  Goal status: Progressing   3.  Patient to demonstrate improvement in RUE shoulder A/ROM without pain limitations >100 degrees flexion, 75 degrees ABDCT. Baseline:  Goal status: Progressing   PLAN: PT FREQUENCY: 1-2x/week PT DURATION: 8 weeks PLANNED INTERVENTIONS: 97750- Physical Performance Testing, 97110-Therapeutic exercises, 97530- Therapeutic activity, [id number]- Neuromuscular re-education, 97535- Self Care, [id number]- Manual therapy, [id number]- Electrical stimulation (unattended), 66063- Electrical stimulation (manual), Patient/Family education, Cryotherapy, and Moist heat PLAN FOR NEXT SESSION: Review HEP; P/ROM, follow surgical protocol restrictions.   11:32 AM, 01/25/24 Dawn Eth, PT, DPT Physical Therapist - [place] [phone]  (Office)   Sasser C, PT 01/25/2024, 11:32 AM

## 2024-01-28 ENCOUNTER — Encounter: Payer: Self-pay | Admitting: Family Medicine

## 2024-01-29 ENCOUNTER — Encounter: Payer: Self-pay | Admitting: Family Medicine

## 2024-01-29 ENCOUNTER — Ambulatory Visit (INDEPENDENT_AMBULATORY_CARE_PROVIDER_SITE_OTHER): Admitting: Family Medicine

## 2024-01-29 ENCOUNTER — Ambulatory Visit: Payer: HMO | Admitting: Cardiovascular Disease

## 2024-01-29 VITALS — BP 138/60 | HR 50 | Temp 97.9°F | Ht 71.75 in | Wt 208.2 lb

## 2024-01-29 DIAGNOSIS — H6590 Unspecified nonsuppurative otitis media, unspecified ear: Secondary | ICD-10-CM

## 2024-01-29 DIAGNOSIS — I5032 Chronic diastolic (congestive) heart failure: Secondary | ICD-10-CM | POA: Diagnosis not present

## 2024-01-29 NOTE — Patient Instructions (Addendum)
 Please check with ENT.  I'll update Dr. Crissie Dome.  Take care.  Glad to see you.

## 2024-01-29 NOTE — Progress Notes (Signed)
 He is off coreg .  He feels better off med.  He is monitoring weight and taking 20mg  lasix  if weight stable, 40mg  if weight increases.    Bladder cancer hx d/w pt.  He is awaiting follow up appointment in the meantime.   Prev ST, that is resolved.  H/o intubation for prev recent procedures.  No fevers.  Ear pressure on L side.  No FCNAVD.  H/o L sided PE tube.  R ears feels normal.   Meds, vitals, and allergies reviewed.   ROS: Per HPI unless specifically indicated in ROS section   Nad Ncat Neck supple, no LA Rrr Ctab L SOM Wax on the PE tube in the ear.  No TM erythema.  R TM wnl.   Discussed options and he consented for attempt at wax removal.  I gently approached the PE tube under direct observation and magnification with a curette to see if the wax was mobile on the tube.  The wax was not mobile.  I did not make contact with the PE tube.  PE tube is still appropriately placed in the eardrum.  I did not manipulate it otherwise.

## 2024-01-30 ENCOUNTER — Encounter: Payer: Self-pay | Admitting: Cardiology

## 2024-01-30 ENCOUNTER — Ambulatory Visit: Attending: Cardiology | Admitting: Physician Assistant

## 2024-01-30 ENCOUNTER — Ambulatory Visit: Admitting: Physical Therapy

## 2024-01-30 VITALS — BP 124/54 | HR 79 | Ht 71.75 in | Wt 208.4 lb

## 2024-01-30 DIAGNOSIS — I6523 Occlusion and stenosis of bilateral carotid arteries: Secondary | ICD-10-CM | POA: Diagnosis not present

## 2024-01-30 DIAGNOSIS — E785 Hyperlipidemia, unspecified: Secondary | ICD-10-CM

## 2024-01-30 DIAGNOSIS — I739 Peripheral vascular disease, unspecified: Secondary | ICD-10-CM

## 2024-01-30 DIAGNOSIS — I255 Ischemic cardiomyopathy: Secondary | ICD-10-CM | POA: Diagnosis not present

## 2024-01-30 DIAGNOSIS — I2510 Atherosclerotic heart disease of native coronary artery without angina pectoris: Secondary | ICD-10-CM | POA: Diagnosis not present

## 2024-01-30 DIAGNOSIS — I10 Essential (primary) hypertension: Secondary | ICD-10-CM

## 2024-01-30 DIAGNOSIS — I5020 Unspecified systolic (congestive) heart failure: Secondary | ICD-10-CM | POA: Diagnosis not present

## 2024-01-30 MED ORDER — METOPROLOL SUCCINATE ER 25 MG PO TB24: 12.5000 mg | ORAL_TABLET | Freq: Every day | ORAL | 3 refills | Status: AC

## 2024-01-30 NOTE — Patient Instructions (Addendum)
 Medication Instructions:  Your physician recommends the following medication changes.  START TAKING: Toprol  XL 12.5 mg daily *If you need a refill on your cardiac medications before your next appointment, please call your pharmacy*  Lab Work: No labs ordered today  If you have labs (blood work) drawn today and your tests are completely normal, you will receive your results only by: MyChart Message (if you have MyChart) OR A paper copy in the mail If you have any lab test that is abnormal or we need to change your treatment, we will call you to review the results.  Testing/Procedures: No test ordered today   Follow-Up: At [facility], you and your health needs are our priority.  As part of our continuing mission to provide you with exceptional heart care, our providers are all part of one team.  This team includes your primary Cardiologist (physician) and Advanced Practice Providers or APPs (Physician Assistants and Nurse Practitioners) who all work together to provide you with the care you need, when you need it.  Your next appointment:   Keep follow up with Dr. Gollan 03/11/24  Provider:   Timothy Gollan, MD or Gildardo Labrador, PA-C

## 2024-01-30 NOTE — Progress Notes (Signed)
 Cardiology Office Note   Date:  01/30/2024  ID:  [name], DOB July 13, 1941, MRN [id number] PCP: Claire Crick, MD  [facility] Providers Cardiologist:  Belva Boyden, MD     History of Present Illness [name] is a 83 y.o. 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   [name] underwent two-vessel CABG in 1990.  LHC in 2002 with successful PCI/BMS to the LCx.  LHC from 06/2015 showed 2 of 2 patent grafts with a patent LCx stent.  He is known to have small vessel and diffuse RCA disease which has been medically managed with long-acting nitrate.  In 08/2018, he underwent lower extremity arterial angiography, in the setting of ongoing lower extremity pain and abnormal duplex.  This showed diffuse and calcified bilateral SFA disease with three-vessel runoff below the knee bilaterally.  Medical therapy was recommended.  He was admitted to the hospital in 05/2019 with COVID.  Echo at that time demonstrated new LV systolic dysfunction with an EF of 35 to 40%.  Follow-up echo in early 2021 showed improvement in LV systolic function with an EF of 50 to 55%.  He had a low risk stress test in 05/2020 which was performed prior to back surgery.  In 03/2021 he complained of chest tightness and dyspnea.  Subsequent echo demonstrated an EF of 50 to 55% with normal RV systolic function and a PASP of 44.7 mmHg.  He subsequently underwent Lexiscan  MPI in 04/2021, in the setting of  admission for unstable angina and dyspnea, which was low risk and without evidence of ischemia.  PFTs in 03/2021 showed mild to moderate obstructive lung disease.  Carotid artery ultrasound in 11/2022 showed less than 50% bilateral ICA stenosis.  He had been treated for COPD exacerbation/bronchitis with antibiotics.  During this time, he underwent echo on 11/22/2022, which demonstrated an EF of 40 to 45%, global hypokinesis, grade 1 diastolic dysfunction, normal RV systolic function and ventricular cavity size, mildly elevated PASP estimated at 39.8 mmHg, mildly dilated left atrium, mild mitral regurgitation, mild aortic stenosis with a mean gradient of 12 mmHg, and an estimated right atrial pressure of 3 mmHg.  Lexiscan  MPI on 12/27/2022 was high risk with a moderate in size, severe, partially reversible basal and mid inferior/inferolateral defect consistent with ischemia and an element of scar.  LVEF 25 to 35%.  CT imaging showed post CABG findings with coronary artery calcification and aortic atherosclerosis.  A 1.4 cm hypodensity was noted in the liver similar to CT from 09/2021.  When compared to study from 2022, the inferior/inferolateral defect and reduced LVEF were new.  Given abnormal findings, it was recommended the patient increase Imdur  to 30 mg twice daily and appointment to be scheduled for today.  Echocardiogram ordered in April 2024 for shortness of breath and bronchitis showed newly reduced LVEF of 40-45% with G1 DD.  Further evaluation with Myoview  Lexiscan  was ordered and completed 01/10/2023 which was high risk is showing moderate in size, severity, partially reversible basal and mid inferior, inferior lateral defect consistent with ischemia and element of scar.  He underwent right and left heart catheterization 01/03/2023 that showed left dominant coronary arteries with severe three-vessel CAD.  Patent grafts including LIMA to LAD, saphenous to the first and second diagonals, patent mid left circumflex  stent.  He was noted to have subacute total occlusion of the mid to distal left circumflex with collaterals from the LAD.  Right heart catheter showed mildly elevated RA pressures, moderate pulmonary hypertension, and moderately to severely elevated wedge pressures with normal cardiac output.  He was treated with DES in mid/distal left circumflex.  He was started on DAPT with aspirin  and Plavix  for minimum of 6 months. He was last seen in clinic 08/11/2023. Since his stent placement he was scheduled for a limited echocardiogram to reassess his LV function. GDMT was previously limited in the setting of renal dysfunction however his most recent GFR being 52. His EF now recovered and completed to switch ARB to Entresto . He was scheduled for repeat BMP. He was continued on carvedilol  3.25 mg twice daily, losartan  25 mg daily, furosemide  20 mg daily. Limited echocardiogram completed 09/05/2023 revealed an LVEF of 40-45%, G1 DD, and mild MR. He was recommended that he start Entresto  24/26 mg twice daily and discontinue his losartan  therapy. Patient originally had declined starting the medication until it been approved by Dr. Gollan.   He was evaluated in clinic 10/06/2023 with similar concerns.  Noted recently after starting on Entresto  with his last echocardiogram.  He stated worsening palpitations with varying heart rates.  Due to side effects of Entresto  fatigue and palpitations he stopped taking Entresto  restarted several days prior losartan .  He had been placed on a ZIO XT monitor for 7 days.  GDMT continued to be escalated as tolerated.  He was evaluated in clinic 12/03/2023 stating he was doing well.  He had several questions and complaints.  He had increased amount of bruising and was previously advised he would not need to stay on aspirin  or clopidogrel  longer than 6 months but it would be better if he would consider staying on.  He had also recently gotten over COVID infection.  He continued on his losartan   spironolactone  and carvedilol .  He decreased his furosemide  to as needed but he continued to decline SGLT2 inhibitor therapy.  He was still encouraged to continue to monitor his heart rate with his Apple Watch.   He was last seen in clinic 12/14/2023 by Dr. Alvenia Aus and had worsening chest pain in the last year.  Cardiac catheterization at bedtime which showed left dominant coronary arteries with severe three-vessel coronary disease.  Grafts were patent but there was severe stenosis in the mid to distal left circumflex.  Ejection fraction was moderately reduced.  He had successful angioplasty and drug-eluting stent placement to the left circumflex.  Repeat echocardiogram in January 2025 revealed LVEF 40-45%.  He was switched from losartan  to Entresto  but did not tolerate the medication.  At his appointment he had been doing recently well from a cardiac standpoint with no recurrent chest pain.  He reported extensive bruising with dual antiplatelet therapy.  Clopidogrel  was discontinued due to his extensive bruising and he was continued on aspirin  81 mg daily indefinitely.  He was recently hospitalized at [facility] from 6/4 - 01/19/2024 with for unstable angina.  He presented to the [facility] emergency department with angina.  CTA of the chest was negative for PE.  He was started on heparin  infusion.  Underwent diagnostic left heart catheterization on 6/6 revealing multivessel coronary artery disease with no  target lesion.  He was seen by our group and advised for medical management.  Carvedilol  was increased from 3.125 mg to 6.25 mg twice daily.  He was advised on outpatient follow-up and we needed 2 weeks as he may need to repeat cath with PCI if fails medical management.  He returns to clinic today doing well from a cardiac perspective without symptoms of angina and cardiac decompensation.  He has had no further chest pain since discharge from the hospital.  He reported worsening weakness and fatigue with increased dose of  carvedilol .  He called our office and was recommended to stop carvedilol .  He has overall been feeling much better since stopping carvedilol .  He reports some ongoing fatigue.  He reports that his legs give out with exertion.  He attributes this to his ongoing battle with bladder cancer.  He denies dyspnea with exertion, lower extremity swelling orthopnea, and PND.  He denies lightheadedness and dizziness.  Cath site is stable.  He denies bleeding and hematochezia.  ROS: 10 point review of system has been reviewed and considered negative except ones been listed in the HPI  Studies Reviewed     LHC 01/19/2024 Conclusions: Severe native coronary artery disease, as detailed below.  Compared to the last catheterization in 12/2022, there has been progression of mild to moderate LMCA disease, with up to 50-60% calcified stenosis now present.  There is also severe distal LCx and LPDA disease. Widely patent LIMA-LAD and sequential SVG-D1-D2. Widely patent proximal and mid LCx stents. Mildly elevated left ventricular filling pressure (LVEDP 20 mmHg).   Recommendations: Escalate antianginal therapy.  If patient continues to have refractory chest pain in spite of maximum tolerated doses of at least 2 antianginal agents, PCI to distal LCx +/- LMCA may need to be considered. Continue aggressive secondary prevention of coronary artery disease.   Lexiscan  MPI 01/18/2024   Findings are consistent with infarction with peri-infarct ischemia. The study is high risk.   No ST deviation was noted.   LV perfusion is abnormal. There is evidence of ischemia. There is evidence of infarction. Defect 1: There is a large defect with moderate reduction in uptake present in the apical to basal anteroseptal location(s) that is partially reversible. There is abnormal wall motion in the defect area. Consistent with infarction and peri-infarct ischemia. Defect 2: There is a medium defect with moderate reduction in uptake present in  the mid to basal inferolateral location(s) that is partially reversible. Consistent with infarction and peri-infarct ischemia.   Left ventricular function is abnormal. Global function is mildly reduced. Nuclear stress EF: 44%. End diastolic cavity size is mildly enlarged. End systolic cavity size is mildly enlarged.   When compared to previous study in 2024, the inferolateral defect seems to be improved.  However, anteroseptal partially reversible defect seems to be new  2D echo 01/17/2024 1. Left ventricular ejection fraction, by estimation, is 50 to 55%. The  left ventricle has low normal function. The left ventricle has no regional  wall motion abnormalities. The left ventricular internal cavity size was  mildly dilated. Left ventricular  diastolic parameters were normal.   2. Right ventricular systolic function is normal. The right ventricular  size is normal. Tricuspid regurgitation signal is inadequate for assessing  PA pressure.   3. Left atrial size was mildly dilated.   4. The mitral valve is normal in structure. Mild mitral valve  regurgitation. No evidence of mitral stenosis.   5. The aortic valve is calcified. Aortic valve  regurgitation is not  visualized. Mild to moderate aortic valve stenosis. Aortic valve area, by  VTI measures 1.16 cm. Aortic valve mean gradient measures 11.2 mmHg.   Event Monitor (Zio) 11/07/23 Normal sinus rhythm Patient had a min HR of 44 bpm, max HR of 131 bpm, and avg HR of 73 bpm.  Bundle Branch Block/IVCD was present.    Isolated SVEs were rare (<1.0%), SVE Triplets were rare (<1.0%), and no SVE Couplets were present.  Isolated VEs were rare (<1.0%, 3201), VE Couplets were rare (<1.0%, 45), and VE Triplets were rare (<1.0%, 2).  Ventricular Trigeminy was present.   No patient triggered events recorded   2D echo 09/05/2023 1. Left ventricular ejection fraction, by estimation, is 40 to 45%. The  left ventricle has mildly decreased function. The left  ventricle  demonstrates global hypokinesis. Left ventricular diastolic parameters are  consistent with Grade I diastolic  dysfunction (impaired relaxation). The global longitudinal strain is  abnormal.   2. Right ventricular systolic function is normal. The right ventricular  size is normal.   3. Left atrial size was mildly dilated.   4. The mitral valve is normal in structure. Mild mitral valve  regurgitation. No evidence of mitral stenosis.   5. The aortic valve is normal in structure. Aortic valve regurgitation is  not visualized. Aortic valve sclerosis is present, with no evidence of  aortic valve stenosis.   6. The inferior vena cava is normal in size with greater than 50%  respiratory variability, suggesting right atrial pressure of 3 mmHg.    Event Monitor (ZIO) 03/03/2023 Normal sinus rhythm Patient had a min HR of 49 bpm, max HR of 152 bpm, and avg HR of 75 bpm.    2 Supraventricular Tachycardia runs occurred, the run with the fastest interval lasting 15 beats with a max rate of 152 bpm (avg 135 bpm); the run with the fastest interval was also the longest.  Not patient triggered   Isolated SVEs were rare (<1.0%), SVE Couplets were rare (<1.0%), and SVE Triplets were rare (<1.0%).  Isolated VEs were rare (<1.0%), VE Couplets were rare (<1.0%), and no VE Triplets were present.   R/LHC 01/13/2023   Ost LM to LM lesion is 50% stenosed.   Ost LAD to Prox LAD lesion is 100% stenosed.   Mid LAD lesion is 60% stenosed.   2nd Diag lesion is 50% stenosed.   Prox RCA-1 lesion is 99% stenosed.   Prox RCA-2 lesion is 90% stenosed.   Prox Cx to Mid Cx lesion is 5% stenosed.   Ost Cx to Prox Cx lesion is 40% stenosed.   Mid Cx to Dist Cx lesion is 99% stenosed.   Dist Cx lesion is 50% stenosed.   Dist LAD lesion is 40% stenosed.   A drug-eluting stent was successfully placed using a STENT ONYX FRONTIER 3.0X18.   Post intervention, there is a 10% residual stenosis.   LIMA and is  moderate in size.   and is anatomically normal.   The graft exhibits no disease.   There is moderate left ventricular systolic dysfunction.   LV end diastolic pressure is moderately elevated.   The left ventricular ejection fraction is 35-45% by visual estimate.   1.  Left dominant coronary arteries with severe three-vessel coronary artery disease.  Patent grafts including LIMA to LAD and SVG to first and second diagonals.  Patent mid left circumflex stent.  However, there is subtotal occlusion of the mid to distal left circumflex  with collaterals from the LAD. 2.  Moderately reduced LV systolic function. 3.  Right heart catheterization showed mildly elevated RA pressure, moderate pulmonary hypertension, moderately to severely elevated wedge pressure and normal cardiac output. 4.  Difficult but successful angioplasty and drug-eluting stent placement to the mid/distal left circumflex.  The procedure was difficult due to calcifications and subtotal occlusion with difficulty crossing.   Recommendations: Dual antiplatelet therapy for at least 6 months and preferably longer. Aggressive treatment of risk factors. I increased the dose of oral furosemide  to 40 mg twice daily which will be continued while hospitalized and then likely decreased to 40 mg once daily upon discharge.  He was only taking 20 mg daily of furosemide .   Lexiscan  MPI 12/27/2022:   Abnormal pharmacologic myocardial perfusion stress test.   There is a moderate in size, severe, partially reversible basal and mid inferior/inferolateral defect consistent with ischemia and an element of scar.   Left ventricular systolic function is severely reduced (LVEF 25-35%).   Post-CABG findings, coronary artery calcification, and aortic atherosclerosis are noted on the attenuation correction CT.   1.4 cm hypodensity is noted in the liver, similar to the prior abdominal CT from 10/04/2021.   Compared with the prior study from 04/22/2021,  inferior/inferolateral defect and reduced LVEF are new.   This is a high-risk study.   2D echo 11/22/2022: 1. Left ventricular ejection fraction, by estimation, is 40 to 45%. Left  ventricular ejection fraction by 3D volume is 42 %. The left ventricle has  mildly decreased function. The left ventricle demonstrates global  hypokinesis. Left ventricular diastolic   parameters are consistent with Grade I diastolic dysfunction (impaired  relaxation). The average left ventricular global longitudinal strain is  -10.2 %.   2. Right ventricular systolic function is normal. The right ventricular  size is normal. There is mildly elevated pulmonary artery systolic  pressure. The estimated right ventricular systolic pressure is 39.8 mmHg.   3. Left atrial size was mildly dilated.   4. The mitral valve is normal in structure. Mild mitral valve  regurgitation. No evidence of mitral stenosis.   5. The aortic valve is normal in structure. Aortic valve regurgitation is  not visualized. Mild aortic valve stenosis. Aortic valve mean gradient  measures 12.0 mmHg.   6. The inferior vena cava is normal in size with greater than 50%  respiratory variability, suggesting right atrial pressure of 3 mmHg.   Comparison(s): 03/23/21 50-55%, Mild LVH, RVSP 45.    Carotid artery ultrasound 10/12/2022: IMPRESSION: Mild-to-moderate bilateral carotid bifurcation plaque with Doppler measurements indicative of less than 50% stenosis.   Lexiscan  MPI 04/2021: Pharmacological myocardial perfusion imaging study with no significant  ischemia Normal wall motion, EF estimated at 52% No EKG changes concerning for ischemia at peak stress or in recovery. Low risk scan   2D echo 03/2021: 1. Left ventricular ejection fraction, by estimation, is 50 to 55%. The  left ventricle has low normal function. The left ventricle has no regional  wall motion abnormalities. There is mild left ventricular hypertrophy.  Left ventricular  diastolic  parameters are consistent with Grade I diastolic dysfunction (impaired  relaxation).   2. Right ventricular systolic function is normal. The right ventricular  size is normal. There is mildly elevated pulmonary artery systolic  pressure. The estimated right ventricular systolic pressure is 44.7 mmHg.   3. Left atrial size was mild to moderately dilated.   4. The mitral valve is normal in structure. Trivial mitral valve  regurgitation.  No evidence of mitral stenosis.   5. The aortic valve is normal in structure. Aortic valve regurgitation is  not visualized. Mild to moderate aortic valve sclerosis/calcification is  present, without any evidence of aortic stenosis. Risk Assessment/Calculations           Physical Exam VS:  There were no vitals taken for this visit.   Wt Readings from Last 3 Encounters:  01/29/24 208 lb 3.2 oz (94.4 kg)  01/17/24 212 lb 4.9 oz (96.3 kg)  01/10/24 212 lb 6 oz (96.3 kg)    GEN: Well nourished, well developed in no acute distress NECK: No JVD; No carotid bruits CARDIAC: RRR, no murmurs, rubs, gallops RESPIRATORY:  Clear to auscultation without rales, wheezing or rhonchi  ABDOMEN: Soft, non-tender, non-distended EXTREMITIES:  No edema; No deformity   ASSESSMENT AND PLAN  Coronary artery disease - Recent hospitalization 6/4-6/6 for unstable angina with cardiac catheterization showing some progression of LMCA disease up to 50-60% stenosis with severe distal Lcx and LPDA disease. Recommendation for escalation of antianginal therapy, although patient did not tolerate increased dose of carvedilol  and it was subsequently discontinued.  Patient doing well without symptoms of angina and cardiac decompensation.  Recommend checking labs including CBC, BMP, and lipid panel today, patient would like to defer until next appointment.  Start metoprolol  succinate 12.5 mg daily for antianginal effect.  Continue aspirin , atorvastatin , ezetimibe , isosorbide   mononitrate, losartan , and ranolazine . If patient has recurrent symptoms, PCI to distal LCx +/- LMCA may need to be considered.  HFmrEF Ischemic cardiomyopathy - Echo 6/4 showed EF 50-55%.  Patient appears euvolemic on exam.  Continue furosemide , losartan , and spironolactone .  Hyperlipidemia with goal LDL < 70 - Most recent lipid panel 07/2023 with LDL 96.  Recommend repeat lipid panel, patient would like to defer until next visit.  Continue atorvastatin  and ezetimibe .  Hypertension - BP well-controlled.  Continue antihypertensives as above.  Start metoprolol  as above.  Peripheral artery disease - Lower extremity arterial duplex revealed bilateral SFA disease.  He continues to follow with Dr. Alvenia Aus.  He is currently without claudication and rest pain.  Continue aspirin .  Bilateral carotid artery stenosis - Carotid duplex 09/2022 showed mild to moderate bilateral carotid bifurcation plaque with Doppler measurements less than 50% stenosis.  Continue surveillance.      Dispo: Follow-up with Dr. Gollan as scheduled 03/11/2024.  Signed, Brodie Cannon, PA-C

## 2024-01-31 DIAGNOSIS — H6982 Other specified disorders of Eustachian tube, left ear: Secondary | ICD-10-CM | POA: Diagnosis not present

## 2024-01-31 DIAGNOSIS — H6122 Impacted cerumen, left ear: Secondary | ICD-10-CM | POA: Diagnosis not present

## 2024-02-01 ENCOUNTER — Ambulatory Visit: Admitting: Physical Therapy

## 2024-02-01 NOTE — Assessment & Plan Note (Signed)
 This could be exacerbated by PE tube occlusion from wax.  I need to ENT input.  Unguinal route this to PCP and Dr. Silvestre Drum with ENT.  I appreciate the help of all involved.  I asked him to follow-up with ENT.

## 2024-02-01 NOTE — Assessment & Plan Note (Signed)
 He feels better off Coreg  and he is adjusting his Lasix  20 versus 40 mg/day depending on his weight.

## 2024-02-02 ENCOUNTER — Other Ambulatory Visit: Payer: Self-pay | Admitting: Cardiovascular Disease

## 2024-02-06 ENCOUNTER — Encounter: Payer: Self-pay | Admitting: Dermatology

## 2024-02-06 ENCOUNTER — Ambulatory Visit: Admitting: Dermatology

## 2024-02-06 ENCOUNTER — Encounter: Admitting: Physical Therapy

## 2024-02-06 DIAGNOSIS — D1801 Hemangioma of skin and subcutaneous tissue: Secondary | ICD-10-CM

## 2024-02-06 DIAGNOSIS — L814 Other melanin hyperpigmentation: Secondary | ICD-10-CM | POA: Diagnosis not present

## 2024-02-06 DIAGNOSIS — Z79899 Other long term (current) drug therapy: Secondary | ICD-10-CM

## 2024-02-06 DIAGNOSIS — L820 Inflamed seborrheic keratosis: Secondary | ICD-10-CM | POA: Diagnosis not present

## 2024-02-06 DIAGNOSIS — Z1283 Encounter for screening for malignant neoplasm of skin: Secondary | ICD-10-CM

## 2024-02-06 DIAGNOSIS — L578 Other skin changes due to chronic exposure to nonionizing radiation: Secondary | ICD-10-CM

## 2024-02-06 DIAGNOSIS — Z5111 Encounter for antineoplastic chemotherapy: Secondary | ICD-10-CM

## 2024-02-06 DIAGNOSIS — L570 Actinic keratosis: Secondary | ICD-10-CM | POA: Diagnosis not present

## 2024-02-06 DIAGNOSIS — Z85828 Personal history of other malignant neoplasm of skin: Secondary | ICD-10-CM | POA: Diagnosis not present

## 2024-02-06 DIAGNOSIS — L821 Other seborrheic keratosis: Secondary | ICD-10-CM

## 2024-02-06 DIAGNOSIS — Z8589 Personal history of malignant neoplasm of other organs and systems: Secondary | ICD-10-CM

## 2024-02-06 DIAGNOSIS — D229 Melanocytic nevi, unspecified: Secondary | ICD-10-CM

## 2024-02-06 DIAGNOSIS — W908XXA Exposure to other nonionizing radiation, initial encounter: Secondary | ICD-10-CM | POA: Diagnosis not present

## 2024-02-06 DIAGNOSIS — Z7189 Other specified counseling: Secondary | ICD-10-CM | POA: Diagnosis not present

## 2024-02-06 DIAGNOSIS — D692 Other nonthrombocytopenic purpura: Secondary | ICD-10-CM

## 2024-02-06 DIAGNOSIS — L219 Seborrheic dermatitis, unspecified: Secondary | ICD-10-CM

## 2024-02-06 MED ORDER — FLUOROURACIL 5 % EX CREA: TOPICAL_CREAM | CUTANEOUS | 2 refills | Status: AC

## 2024-02-06 MED ORDER — HYDROCORTISONE 2.5 % EX CREA: TOPICAL_CREAM | CUTANEOUS | 5 refills | Status: AC

## 2024-02-06 MED ORDER — KETOCONAZOLE 2 % EX CREA: TOPICAL_CREAM | CUTANEOUS | 0 refills | Status: AC

## 2024-02-06 NOTE — Progress Notes (Signed)
 Follow-Up Visit   Subjective  [name] is a 83 y.o. male who presents for the following: Skin Cancer Screening and Upper Body Skin Exam  The patient presents for Upper Body Skin Exam (UBSE) for skin cancer screening and mole check. The patient has spots, moles and lesions to be evaluated, some may be new or changing and the patient may have concern these could be cancer.  The following portions of the chart were reviewed this encounter and updated as appropriate: medications, allergies, medical history  Review of Systems:  No other skin or systemic complaints except as noted in HPI or Assessment and Plan.  Objective  Well appearing patient in no apparent distress; mood and affect are within normal limits.  All skin waist up examined. Relevant physical exam findings are noted in the Assessment and Plan.  right chest Stuck-on, waxy, tan-brown papules and plaques -- Discussed benign etiology and prognosis.  left cheek x 1, left ear x 2, right ear x 1, right of midline nasal supra tip x 1, left midline nasal tip x 1 (6) Erythematous thin papules/macules with gritty scale.   Assessment & Plan   INFLAMED SEBORRHEIC KERATOSIS right chest Symptomatic, irritating, patient would like treated.  Destruction of lesion - right chest Complexity: simple   Destruction method: cryotherapy   Informed consent: discussed and consent obtained   Timeout:  patient name, date of birth, surgical site, and procedure verified Lesion destroyed using liquid nitrogen: Yes   Region frozen until ice ball extended beyond lesion: Yes   Outcome: patient tolerated procedure well with no complications   Post-procedure details: wound care instructions given   AK (ACTINIC KERATOSIS) (6) left cheek x 1, left ear x 2, right ear x 1, right of midline nasal supra tip x 1, left midline nasal tip x 1 (6) ACTINIC DAMAGE WITH PRECANCEROUS ACTINIC KERATOSES Counseling for Topical Chemotherapy Management: Patient  exhibits: - Severe, confluent actinic changes with pre-cancerous actinic keratoses that is secondary to cumulative UV radiation exposure over time - Condition that is severe; chronic, not at goal. - diffuse scaly erythematous macules and papules with underlying dyspigmentation - Discussed Prescription Field Treatment topical Chemotherapy for Severe, Chronic Confluent Actinic Changes with Pre-Cancerous Actinic Keratoses Field treatment involves treatment of an entire area of skin that has confluent Actinic Changes (Sun/ Ultraviolet light damage) and PreCancerous Actinic Keratoses by method of PhotoDynamic Therapy (PDT) and/or prescription Topical Chemotherapy agents such as 5-fluorouracil, 5-fluorouracil/calcipotriene, and/or imiquimod.  The purpose is to decrease the number of clinically evident and subclinical PreCancerous lesions to prevent progression to development of skin cancer by chemically destroying early precancer changes that may or may not be visible.  It has been shown to reduce the risk of developing skin cancer in the treated area. As a result of treatment, redness, scaling, crusting, and open sores may occur during treatment course. One or more than one of these methods may be used and may have to be used several times to control, suppress and eliminate the PreCancerous changes. Discussed treatment course, expected reaction, and possible side effects. - Recommend daily broad spectrum sunscreen SPF 30+ to sun-exposed areas, reapply every 2 hours as needed.  - Staying in the shade or wearing long sleeves, sun glasses (UVA+UVB protection) and wide brim hats (4-inch brim around the entire circumference of the hat) are also recommended. - Call for new or changing lesions.   In August start this cream on his nose twice a day x 7 days then  stop Destruction of lesion - left cheek x 1, left ear x 2, right ear x 1, right of midline nasal supra tip x 1, left midline nasal tip x 1 (6) Complexity:  simple   Destruction method: cryotherapy   Informed consent: discussed and consent obtained   Timeout:  patient name, date of birth, surgical site, and procedure verified Lesion destroyed using liquid nitrogen: Yes   Region frozen until ice ball extended beyond lesion: Yes   Outcome: patient tolerated procedure well with no complications   Post-procedure details: wound care instructions given   SEBORRHEIC DERMATITIS   Related Medications hydrocortisone  2.5 % cream Apply to chin and beside the nose at bedtime Tues, Thurs, Sat  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 Condition is symptomatic/ bothersome to patient. Not currently at goal.  Seborrheic Dermatitis is a chronic persistent rash characterized by pinkness and scaling most commonly of the mid face but also can occur on the scalp (dandruff), ears; mid chest, mid back and groin.  It tends to be exacerbated by stress and cooler weather.  People who have neurologic disease may experience new onset or exacerbation of existing seborrheic dermatitis.  The condition is not curable but treatable and can be controlled. Treatment Plan: Start Ketoconazole  2% cream    Apply to chin and beside the nose at bedtime Mon, Wed, Fri  Start Hydrocortisone  2.5 %cream Apply to chin and beside the nose at bedtime Tues, Thurs, Sat   Skin cancer screening performed today.  Lentigines, Seborrheic Keratoses, Hemangiomas - Benign normal skin lesions - Benign-appearing - Call for any changes  Melanocytic Nevi - Tan-brown and/or pink-flesh-colored symmetric macules and papules - Benign appearing on exam today - Observation - Call clinic for new or changing moles - Recommend daily use of broad spectrum spf 30+ sunscreen to sun-exposed areas.   Purpura - Chronic; persistent and recurrent.  Treatable, but not curable. 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  - Call if any new or changing lesions are noted between office visits   Return in about 7 months (around 09/07/2024) for UBSE, hx of SCC.  IFay Kirks, CMA, am acting as scribe for Alm Rhyme, MD .   Documentation: I have reviewed the above documentation for accuracy and completeness, and I agree with the above.  Alm Rhyme, MD

## 2024-02-06 NOTE — Patient Instructions (Addendum)
 Instructions for [facility] Medications   One or more of your medications was sent to the [facility] mail order compounding pharmacy. You will receive an email from them and can purchase the medicine through that link. It will then be mailed to your home at the address you confirmed. If for any reason you do not receive an email from them, please check your spam folder. If you still do not find the email, please let us  know. [facility] phone number is [phone].   In August start this cream on his nose twice a day x 7 days then stop  5-Fluorouracil/Calcipotriene Patient Education   Actinic keratoses are the dry, red scaly spots on the skin caused by sun damage. A portion of these spots can turn into skin cancer with time, and treating them can help prevent development of skin cancer.   Treatment of these spots requires removal of the defective skin cells. There are various ways to remove actinic keratoses, including freezing with liquid nitrogen, treatment with creams, or treatment with a blue light procedure in the office.   5-fluorouracil cream is a topical cream used to treat actinic keratoses. It works by interfering with the growth of abnormal fast-growing skin cells, such as actinic keratoses. These cells peel off and are replaced by healthy ones.   5-fluorouracil/calcipotriene is a combination of the 5-fluorouracil cream with a vitamin D  analog cream called calcipotriene. The calcipotriene alone does not treat actinic keratoses. However, when it is combined with 5-fluorouracil, it helps the 5-fluorouracil treat the actinic keratoses much faster so that the same results can be achieved with a much shorter treatment time.  INSTRUCTIONS FOR 5-FLUOROURACIL/CALCIPOTRIENE CREAM:   5-fluorouracil/calcipotriene cream typically only needs to be used for 4-7 days. A thin layer should be applied twice a day to the treatment areas recommended by your physician.   If your physician  prescribed you separate tubes of 5-fluourouracil and calcipotriene, apply a thin layer of 5-fluorouracil followed by a thin layer of calcipotriene.   Avoid contact with your eyes, nostrils, and mouth. Do not use 5-fluorouracil/calcipotriene cream on infected or open wounds.   You will develop redness, irritation and some crusting at areas where you have pre-cancer damage/actinic keratoses. IF YOU DEVELOP PAIN, BLEEDING, OR SIGNIFICANT CRUSTING, STOP THE TREATMENT EARLY - you have already gotten a good response and the actinic keratoses should clear up well.  Wash your hands after applying 5-fluorouracil 5% cream on your skin.   A moisturizer or sunscreen with a minimum SPF 30 should be applied each morning.   Once you have finished the treatment, you can apply a thin layer of Vaseline twice a day to irritated areas to soothe and calm the areas more quickly. If you experience significant discomfort, contact your physician.  For some patients it is necessary to repeat the treatment for best results.  SIDE EFFECTS: When using 5-fluorouracil/calcipotriene cream, you may have mild irritation, such as redness, dryness, swelling, or a mild burning sensation. This usually resolves within 2 weeks. The more actinic keratoses you have, the more redness and inflammation you can expect during treatment. Eye irritation has been reported rarely. If this occurs, please let us  know.  If you have any trouble using this cream, please call the office. If you have any other questions about this information, please do not hesitate to ask me before you leave the office.        Cryotherapy Aftercare  Wash gently with soap and water  everyday.  Apply Vaseline and Band-Aid daily until healed.      Due to recent changes in healthcare laws, you may see results of your pathology and/or laboratory studies on MyChart before the doctors have had a chance to review them. We understand that in some cases there may be  results that are confusing or concerning to you. Please understand that not all results are received at the same time and often the doctors may need to interpret multiple results in order to provide you with the best plan of care or course of treatment. Therefore, we ask that you please give us  2 business days to thoroughly review all your results before contacting the office for clarification. Should we see a critical lab result, you will be contacted sooner.   If You Need Anything After Your Visit  If you have any questions or concerns for your doctor, please call our main line at [phone] and press option 4 to reach your doctor's medical assistant. If no one answers, please leave a voicemail as directed and we will return your call as soon as possible. Messages left after 4 pm will be answered the following business day.   You may also send us  a message via MyChart. We typically respond to MyChart messages within 1-2 business days.  For prescription refills, please ask your pharmacy to contact our office. Our fax number is [phone].  If you have an urgent issue when the clinic is closed that cannot wait until the next business day, you can page your doctor at the number below.    Please note that while we do our best to be available for urgent issues outside of office hours, we are not available 24/7.   If you have an urgent issue and are unable to reach us , you may choose to seek medical care at your doctor's office, retail clinic, urgent care center, or emergency room.  If you have a medical emergency, please immediately call 911 or go to the emergency department.  Pager Numbers  - Dr. Hester: [phone]  - Dr. Jackquline: [phone]  - Dr. Claudene: [phone]   In the event of inclement weather, please call our main line at [phone] for an update on the status of any delays or closures.  Dermatology Medication Tips: Please keep the boxes that topical medications  come in in order to help keep track of the instructions about where and how to use these. Pharmacies typically print the medication instructions only on the boxes and not directly on the medication tubes.   If your medication is too expensive, please contact our office at [phone] option 4 or send us  a message through MyChart.   We are unable to tell what your co-pay for medications will be in advance as this is different depending on your insurance coverage. However, we may be able to find a substitute medication at lower cost or fill out paperwork to get insurance to cover a needed medication.   If a prior authorization is required to get your medication covered by your insurance company, please allow us  1-2 business days to complete this process.  Drug prices often vary depending on where the prescription is filled and some pharmacies may offer cheaper prices.  The website www.goodrx.com contains coupons for medications through different pharmacies. The prices here do not account for what the cost may be with help from insurance (it may be cheaper with your insurance), but the website can give you the price if you did not use  any insurance.  - You can print the associated coupon and take it with your prescription to the pharmacy.  - You may also stop by our office during regular business hours and pick up a GoodRx coupon card.  - If you need your prescription sent electronically to a different pharmacy, notify our office through [facility] or by phone at [phone] option 4.     Si Usted Necesita Algo Despus de Su Visita  Tambin puede enviarnos un mensaje a travs de Clinical cytogeneticist. Por lo general respondemos a los mensajes de MyChart en el transcurso de 1 a 2 das hbiles.  Para renovar recetas, por favor pida a su farmacia que se ponga en contacto con nuestra oficina. Randi lakes de fax es [place] [phone].  Si tiene un asunto urgente cuando la clnica est cerrada y que no  puede esperar hasta el siguiente da hbil, puede llamar/localizar a su doctor(a) al nmero que aparece a continuacin.   Por favor, tenga en cuenta que aunque hacemos todo lo posible para estar disponibles para asuntos urgentes fuera del horario de [place], no estamos disponibles las 24 horas del da, los 7 [address]  [address] de la [place].   Si tiene un problema urgente y no puede comunicarse con nosotros, puede optar por buscar atencin mdica  en el consultorio de su doctor(a), en una clnica privada, en un centro de atencin urgente o en una sala de emergencias.  Si tiene Engineer, drilling, por favor llame inmediatamente al 911 o vaya a la sala de emergencias.  Nmeros de bper  - Dr. Hester: [phone]  - Dra. Jackquline: [phone]  - Dr. Claudene: [phone]   En caso de inclemencias del tiempo, por favor llame a landry capes principal al [phone] para una actualizacin sobre el [place] de cualquier retraso o cierre.  Consejos para la medicacin en dermatologa: Por favor, guarde las cajas en las que vienen los medicamentos de uso tpico para ayudarle a seguir las instrucciones sobre dnde y cmo usarlos. Las farmacias generalmente imprimen las instrucciones del medicamento slo en las cajas y no directamente en los tubos del [place].   Si su medicamento es muy caro, por favor, pngase en contacto con landry rieger llamando al [phone] y presione la opcin 4 o envenos un mensaje a travs de Clinical cytogeneticist.   No podemos decirle cul ser su copago por los medicamentos por adelantado ya que esto es diferente dependiendo de la cobertura de su seguro. Sin embargo, es posible que podamos encontrar un medicamento sustituto a Audiological scientist un formulario para que el seguro cubra el medicamento que se considera necesario.   Si se requiere una autorizacin previa para que su compaa de seguros malta su medicamento, por favor permtanos de 1 a 2 das hbiles para completar este  proceso.  Los precios de los medicamentos varan con frecuencia dependiendo del Environmental consultant de dnde se surte la receta y alguna farmacias pueden ofrecer precios ms baratos.  El sitio web www.goodrx.com tiene cupones para medicamentos de Health and safety inspector. Los precios aqu no tienen en cuenta lo que podra costar con la ayuda del seguro (puede ser ms barato con su seguro), pero el sitio web puede darle el precio si no utiliz Tourist information centre manager.  - Puede imprimir el cupn correspondiente y llevarlo con su receta a la farmacia.  - Tambin puede pasar por nuestra oficina durante el horario de atencin regular y Education officer, museum una tarjeta de cupones de GoodRx.  - Si necesita que su receta se enve electrnicamente  a [place] [employer], informe a nuestra oficina a travs de MyChart de [place] o por telfono llamando al [phone] y presione la opcin 4.

## 2024-02-07 DIAGNOSIS — G4733 Obstructive sleep apnea (adult) (pediatric): Secondary | ICD-10-CM | POA: Diagnosis not present

## 2024-02-07 DIAGNOSIS — I10 Essential (primary) hypertension: Secondary | ICD-10-CM | POA: Diagnosis not present

## 2024-02-08 ENCOUNTER — Ambulatory Visit: Admitting: Physical Therapy

## 2024-02-08 ENCOUNTER — Encounter: Payer: Self-pay | Admitting: Physical Therapy

## 2024-02-08 DIAGNOSIS — M25611 Stiffness of right shoulder, not elsewhere classified: Secondary | ICD-10-CM | POA: Diagnosis not present

## 2024-02-08 NOTE — Therapy (Signed)
 OUTPATIENT PHYSICAL THERAPY TREATMENT  Patient Name: [name] MRN: [id number] DOB:[birth year], 83 y.o., male Today's Date: 02/08/2024  END OF SESSION:  PT End of Session - 02/08/24 1128     Visit Number 3    Number of Visits 16    Date for PT Re-Evaluation 03/19/24    Authorization Type HTA    Authorization Time Period 01/23/24-03/19/24    Authorization - Number of Visits 3    Progress Note Due on Visit 10    PT Start Time 1120    PT Stop Time 1200    PT Time Calculation (min) 40 min    Activity Tolerance Patient tolerated treatment well    Behavior During Therapy [facility] for tasks assessed/performed          Past Medical History:  Diagnosis Date   Actinic keratosis    Anginal pain (HCC)    Anxiety    Aortic ectasia, abdominal (HCC)    a.) CT abd 01/01/2022: 2.9 cm infrarenal abdominal aorta   Aortic stenosis    Bladder cancer (HCC) 2023   BPH (benign prostatic hyperplasia) 09/06/2007   CAD s/p CABG    a.) 1990 s/p MI--> CABG x 3; b.) LHC/PCI 2002 --> BMS (unk type) to LCx   Carotid arterial disease (HCC)    a.) 08/2016 Carotid U/S: <39% bilat; b.) carotid doppler 10/12/2022: mild-to-moderate (<50%) bilateral bifurcation plaque.   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 06/2019 Echo (in setting of COVID): EF 35-40%, mild LVH, g1 DD, glob HK; b. 09/2019 Echo:  EF 50-55%, Gr1 DD; c. 03/2021 Echo: EF 50-55%, no rwma, mild LVH, GrI DD, nl RV fxn. RVSP 44.82mmHg. Mild-mod dil LA. Triv MR. Mild-mod Ao sclerosis.   History of 2019 novel coronavirus disease (COVID-19) 05/20/2019   History of hiatal hernia    History of shingles    HLD (hyperlipidemia) 04/26/2007   HTN (hypertension) 04/30/2007   LBBB (left bundle branch block)    Long-term use of aspirin  therapy    Lumbar disc disease with radiculopathy    Migraines    Myocardial infarction (HCC) 1990   a.) resulted in 3v CABG   Myocardial infarction (HCC) 2002   a.) second cardiac event; PCI with placement of BMS (unknown type) to LCx   Neuropathy of both feet    OSA on CPAP    Osteoarthritis    PAD (peripheral artery disease) (HCC)    a. 07/2017 LE duplex: RSFA 75-5m, LSFA 75-90m, 50-74d; c. 08/2018 Periph Angio: No signif AoIliac dzs. Mod-sev Ca2+ RSFA w/ diff dzs throughout- 3 vessel runoff. Borderline signif LSFA dzs w/ mod-sev Ca2+ vessels and 3 vessel runoff below the knee-->Med rx.   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   Right mid dorsum forearm - in situ - EDC   T2DM (type 2 diabetes mellitus) (HCC)    Traumatic open wound of lower leg with delayed healing, left 12/28/2022   Past Surgical History:  Procedure Laterality Date   ABDOMINAL AORTOGRAM W/LOWER EXTREMITY [name] 09/12/2018   Procedure: ABDOMINAL AORTOGRAM W/LOWER EXTREMITY;  Surgeon: Darron Deatrice LABOR, MD;  Location: [facility];  Service: Cardiovascular;  Laterality: [name];   ANTERIOR CERVICAL DECOMP/DISCECTOMY FUSION [name] 07/11/2022   Anterior Cervical Decompression Fusion ,Interboy Prothesis,Plate/Screws , Cervical  four-five,Cervical five-six Gavin, Reyes, MD)   APPENDECTOMY     rupture   BACK SURGERY     1984 and then another one [facility]   BLADDER INSTILLATION [name] 10/19/2021   Procedure: BLADDER INSTILLATION OF GEMCITABINE ;  Surgeon: Twylla Glendia BROCKS, MD;  Location: [facility];  Service: Urology;  Laterality: [name];   BLADDER INSTILLATION [name] 11/15/2022   Procedure: BLADDER INSTILLATION OF GEMCITABINE ;  Surgeon: Twylla Glendia BROCKS, MD;  Location: [facility];  Service: Urology;  Laterality: [name];   CARDIAC CATHETERIZATION [name] 07/07/2015   Procedure: Left Heart Cath and Cors/Grafts Angiography;  Surgeon: Evalene JINNY Lunger, MD;  Location: [facility];  Service: Cardiovascular;  Laterality: [name];   CATARACT EXTRACTION, BILATERAL  2009   CERVICAL SPINE SURGERY  12/2016   cervical stenosis Gavin)   COLONOSCOPY  03/2010   HP polyp, diverticulosis, rpt 10 yrs ([name])   CORONARY ANGIOPLASTY  11/30/2000   LCX   CORONARY ARTERY BYPASS GRAFT  1990   3 vessel    CORONARY STENT INTERVENTION [name] 01/13/2023   Procedure: CORONARY STENT INTERVENTION;  Surgeon: Darron Deatrice LABOR, MD;  Location: [facility];  Service: Cardiovascular;  Laterality: [name];   CYSTOSCOPY  12/23/2010   [facility]   CYSTOSCOPY WITH BIOPSY [name] 01/16/2024   Procedure: CYSTOSCOPY, WITH BIOPSY;  Surgeon: Twylla Glendia BROCKS, MD;  Location: [facility];  Service: Urology;  Laterality: [name];   KNEE ARTHROSCOPY Right    x2   LAMINOTOMY  1986   L5/S1 lumbar laminotomy for two ruptured discs/fusion   LEFT HEART CATH AND CORS/GRAFTS ANGIOGRAPHY [name] 01/19/2024   Procedure: LEFT HEART CATH AND CORS/GRAFTS ANGIOGRAPHY;  Surgeon: Mady Bruckner, MD;  Location: [facility];  Service: Cardiovascular;  Laterality: [name];   LUMBAR LAMINECTOMY/DECOMPRESSION MICRODISCECTOMY [name] 07/13/2016   Procedure: LUMBAR TWO-THREE, LUMBAR THREE-FOUR, LUMBAR FOUR-FIVE LAMINECTOMY AND FORAMINOTOMY;  Surgeon: Reyes Budge, MD;  Location: [facility];  Service:  Neurosurgery;  Laterality: [name];  LAMINECTOMY AND FORAMINOTOMY L2-L3, L3-L4,L4-L5   LUMBAR SPINE SURGERY  06/2020   Bilateral redo laminectomy/laminotomy/foraminotomies/medial facetectomy to decompress the bilateral L4 and L5 nerve roots Gavin)   REVERSE SHOULDER ARTHROPLASTY Right 12/21/2023   Procedure: ARTHROPLASTY, SHOULDER, TOTAL, REVERSE;  Surgeon: Melita Drivers, MD;  Location: [facility];  Service: Orthopedics;  Laterality: Right;   RIGHT/LEFT HEART CATH AND CORONARY ANGIOGRAPHY Bilateral 01/13/2023   Procedure: RIGHT/LEFT HEART CATH AND CORONARY ANGIOGRAPHY;  Surgeon: Darron Deatrice LABOR, MD;  Location: [facility];  Service: Cardiovascular;  Laterality: Bilateral;   TOTAL HIP ARTHROPLASTY Bilateral 1999,2000   TOTAL KNEE ARTHROPLASTY Right 03/06/2017   Procedure: RIGHT TOTAL KNEE ARTHROPLASTY;  Surgeon: Melodi Lerner, MD;  Location: [facility];  Service:  Orthopedics;  Laterality: Right;   TRANSURETHRAL RESECTION OF BLADDER TUMOR [name] 10/19/2021   Procedure: TRANSURETHRAL RESECTION OF BLADDER TUMOR (TURBT);  Surgeon: Twylla Glendia BROCKS, MD;  Location: [facility];  Service: Urology;  Laterality: [name];   TRANSURETHRAL RESECTION OF BLADDER TUMOR [name] 11/15/2022   Procedure: TRANSURETHRAL RESECTION OF BLADDER TUMOR (TURBT);  Surgeon: Twylla Glendia BROCKS, MD;  Location: [facility];  Service: Urology;  Laterality: [name];   Patient Active Problem List   Diagnosis Date Noted   HFrEF (heart failure with reduced ejection fraction) (HCC) 01/05/2024   Urinary incontinence 01/05/2024   Rotator cuff tear arthropathy of right shoulder 09/08/2023   Sinus headache 08/02/2023   Bradycardia 08/02/2023   Abnormal stress test 01/14/2023   Angina pectoris (HCC) 01/13/2023   Retinal artery occlusion, branch, 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   Cervical spondylosis with radiculopathy 07/11/2022   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   OSA (obstructive sleep apnea)    Chronic heart failure with preserved ejection fraction  (HFpEF) (HCC)    Nocturnal hypoxia 06/11/2021   CKD (chronic kidney disease) stage 3, GFR 30-59 ml/min (HCC) 02/24/2021   Peripheral neuropathy 02/23/2021   Spondylolisthesis of lumbar region 07/07/2020   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   Coccydynia 09/20/2019   Nasal sinus congestion 07/19/2019   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   Anxiety 06/04/2019   Chronic right shoulder pain 09/10/2018   Cervical stenosis of spinal canal 06/05/2017   Peripheral edema 06/05/2017   Health maintenance examination 12/06/2016   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/13/2016   Overweight (BMI 25.0-29.9) 07/04/2016   Low vitamin B12 level 01/01/2016   Exertional dyspnea 07/07/2015   Unstable angina (HCC)    Medicare annual wellness visit, subsequent 07/03/2015   Advanced care planning/counseling discussion 07/03/2015   Type 2 diabetes mellitus with other specified complication (HCC) 05/04/2015   Pleuritic chest pain 05/04/2015   Ex-smoker 05/04/2015   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   Spermatocele 04/02/2013   Syncopal vertigo 11/04/2010   Lumbar disc disease with radiculopathy 11/04/2010   Serous otitis media 06/07/2010   Coronary artery disease involving native coronary artery of native heart with unstable angina pectoris (HCC) 08/11/2009   PAD (peripheral artery disease) (HCC) 08/11/2009   DUPUYTREN'S CONTRACTURE, RIGHT 10/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   Carotid stenosis 07/09/2008   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   Benign prostatic hyperplasia with urinary obstruction 09/06/2007   Essential hypertension 04/30/2007   GERD 04/30/2007   Hyperlipidemia associated with type 2 diabetes mellitus (HCC) 04/26/2007   OA (osteoarthritis) of knee 04/26/2007   DVT, HX OF 04/26/2007    PCP: Rilla Baller, MD REFERRING PROVIDER: Franky Pointer, MD (ortho)  REFERRING DIAG: s/p Rt rTSA  THERAPY DIAG:  Stiffness of right shoulder, not elsewhere classified Rationale for  Evaluation and Treatment: Rehabilitation ONSET DATE:   SUBJECTIVE:  SUBJECTIVE STATEMENT: Pt states that he continues to feel soreness in his right shoulder but that things have been going well.   Hand dominance: right  PERTINENT HISTORY: Reverse TSA wiith Dr Melita 2nd week of May 2025. Pt wore sling x 2 weeks, then DC. Pt repors use of polar care, also says he's been moving his arm around a lot and its doing well. Pain controlled. Pt not aware of anti extension, anti IR precautions, but reports he would never do these anyway. Pt just DC from hospital 5 days prior to evaluation here after cardiac chest pain problem, underwent left radial cath, was on heaprin x3 days, now back home.  PAIN:  Are you having pain? No PRECAUTIONS: No shoulder extension, no IR+ADD shoulder right  WEIGHT BEARING RESTRICTIONS: No lifting, no body weight bearing  FALLS:  Has patient fallen in last 6 months? No PATIENT GOALS: improved arm function   NEXT MD VISIT: 2nd week June 2025  OBJECTIVE:  Note: Objective measures were completed at Evaluation unless otherwise noted.  PATIENT SURVEYS :  QUIcK DASH: 70.5% (01/25/24)   UPPER EXTREMITY ROM:    ROM Right eval Right  01/25/24  Shoulder flexion 90 degrees  108 degrees  Shoulder extension prohibited --  Shoulder abduction 45 degrees 78 degrees  Shoulder internal rotation Full  --  Shoulder external rotation -20 degrees --  Elbow flexion Full  Full  Elbow extension Full  Full   (Blank rows = not tested)  PALPATION:  General anterior upper chest tightness and focal edema near surgical scar and pec major  TODAY'S TREATMENT    02/08/24  THEREX: All single UE exercises on RUE              Shoulder Pulleys AAROM Flexion and Extension x 30              Shoulder Pulleys AAROM  Abduction and Adduction x 30             Supine  Shoulder Flexion AAROM with #5 AW on wand 2 x 10 with 5 sec hold at terminal flexion              Shoulder Flexion AROM with flexed elbow 1 x 10              Shoulder Abd AROM with extended elbow 2 x 10               -min VC to avoid lateral trunk flexion             Left Side Lying ER at 0 deg Abd with #1 DB 3  x 10              -min VC to maintain elbow flexion at 90 deg      PATIENT EDUCATION: Education details: See above  Person educated: Runner, broadcasting/film/video: collaborative learning, deliberate practice, positive reinforcement, explicit instruction, establish rules. Education comprehension: fair   HOME EXERCISE PROGRAM: Access Code: [email or web address] URL: [email or web address] Date: 02/08/2024 Prepared by: Toribio Servant  Exercises - Seated Upper Trapezius Stretch  - 1 x daily - 7 x weekly - 3 reps - 60 sec  hold - Standing 'L' Stretch at Counter  - 5 x daily - 15 reps - 5sec hold - Seated Shoulder Flexion AAROM with Pulley Behind  - 1 x daily - 7 x weekly - 3 sets - 10 reps - Seated Shoulder Abduction AAROM with Pulley Behind  - 1 x daily - 7  x weekly - 3 sets - 10 reps - Seated Scapular Retraction  - 5 x daily - 1 sets - 15 reps - 3sec hold - Seated Single Arm Shoulder Lateral Raise  - 3-4 x weekly - 3 sets - 10 reps - Sidelying Shoulder ER with Towel and Dumbbell  - 3-4 x weekly - 2 sets - 10 reps  ASSESSMENT:  CLINICAL IMPRESSION: Pt is now 6 weeks post op right reverse total shoulder. Initiated AROM and AAROM right shoulder activities, which pt was able to perform without an increase in his right shoulder pain and without significant compensation. He continues to progress towards goals and he is now nearing the end of the intermediate post op phase of his protocol. Pt will continue to benefit from skilled PT intervention to improve ROM and strength in RUE in order to restore to PLOF in ADL, IADL, and leisure activity.    OBJECTIVE IMPAIRMENTS: decreased activity tolerance, decreased knowledge of condition, decreased knowledge of use of DME, decreased mobility, decreased ROM, and decreased strength.  ACTIVITY LIMITATIONS: carrying, lifting, sleeping, bathing, toileting, dressing, and reach over head PARTICIPATION LIMITATIONS: meal prep, cleaning, laundry, medication management, interpersonal relationship, driving, shopping, and community activity PERSONAL FACTORS: Age, Behavior pattern, Education, Fitness, Past/current experiences, Profession, and Social background are also affecting patient's functional outcome.  REHAB POTENTIAL: Fair   CLINICAL DECISION MAKING: Medium  EVALUATION COMPLEXITY: Moderate  GOALS: Goals reviewed with patient? No  SHORT TERM GOALS: Target date: 02/22/24  Patient will report comprehension, confidence, and consistent compliance and of a simple home exercise program established to facilitate symptoms management and basic strengthening and/or segment mobility.   Baseline: issued at visit 1 and 2  Goal status: MET    2.  Patient to demonstrate improved score on self-report measure by >9% to indicate reduced self-reported disability and/or pain.    Baseline:  Quick DASH 70.5% Goal status: Progressing   LONG TERM GOALS: Target date: 03/19/24  Patient to improve score on self-report measure by 20% or greater to indicate reduced disability and improved quality of life.   Baseline: Quickash 70.5% Goal status: Progressing   2.  Patient to demonstrate improvement in strength testing by minimum of 1 grade to indicate improved motor performance.   Baseline: 3/5 or less all groups  Goal status: Progressing   3.  Patient to demonstrate improvement in RUE shoulder A/ROM without pain limitations >100 degrees flexion, 75 degrees ABDCT. Baseline:  Goal status: Progressing   PLAN: PT FREQUENCY: 1-2x/week PT DURATION: 8 weeks PLANNED INTERVENTIONS: 97750- Physical Performance Testing,  97110-Therapeutic exercises, 97530- Therapeutic activity, [id number]- Neuromuscular re-education, 97535- Self Care, [id number]- Manual therapy, [id number]- Electrical stimulation (unattended), [id number]- Electrical stimulation (manual), Patient/Family education, Cryotherapy, and Moist heat PLAN FOR NEXT SESSION: Assess right shoulder A/PROM and strength. Continue to progress exercises: shoulder ER AAROM stretch and abduction AAROM. Progress periscapular exercises: scapular squeezes with resistance, low row and inferior glide.    Toribio Servant PT, DPT  [facility] [address], [place], [address] Phone: [phone]   Fax:  [phone]

## 2024-02-11 NOTE — Progress Notes (Unsigned)
 [facility] CONSULT NOTE  Patient Care Team: Rilla Baller, MD as PCP - General (Family Medicine) Perla, Evalene PARAS, MD as PCP - Cardiology (Cardiology) Myra Rosaline FALCON, [facility] (Inactive) as Pharmacist (Pharmacist) Perla Evalene PARAS, MD as Consulting Physician (Cardiology) Tamea Dedra CROME, MD as Consulting Physician (Pulmonary Disease) Darron Deatrice LABOR, MD as Consulting Physician (Cardiology) Pa, [facility] (Ophthalmology)  ASSESSMENT & PLAN:  Wilton is a 83 y.o.male with history of DVT, CKD, CAD with MI, CABG, emphysema, hypertension, hyperlipidemia, type 2 diabetes, carotid artery disease, BPH, DDD being [facility] for NMIBC.  Report of recurrent noninvasive high-grade urothelial carcinoma. BCG induction x 2 and maintenance BCG with recent recurrence. Significant medical comorbidities. Opinion regarding alternate intravesical therapies.  Discussed treatment of nonmuscle invasive bladder cancer.  Patient has BCG refractory NMIBC.  We discussed treatment options including intravesical gemcitabine  docetaxel, IV pembrolizumab.  Other intravesical therapy not available here currently.  However, any treatment are not to be curative.  Report he is not a good candidate for cystectomy due to comorbidities.  This is the only curative option although chemoradiation can be considered subsequently upon progression.  Gem-Doce Protocol   Gemcitabine  - Docetaxel (Gem-Doce): Indwelling catheter is placed and the bladder is drained. 1 gram gemcitabine  in 50 cc saline instilled for 1 hour (foley is capped). After 1 hour, uncap foley and drain the gemcitabine . Then instill docetaxel (37.5 mg in 50 cc saline) for 1 hour.   - Induction- Weekly x 6 weeks   - Maintenance- Monthly x 24 months with cystoscopy q66months.     Key Points:   (1) Minimize drug dilution   a. Overnight fast/dehydration - night before treatment   b. Avoid all diuretics (including  alcohol & coffee) - within 8 hours of treatment   c. Empty the bladder completely - NOTE: typically, lidocaine  jelly forms a plug that prevents free flow of urine from bladder after catheter insertion. Air plugs can do the same. Apply gentle aspiration (e.g. using the spent urojet) to start the flow of urine. Then drain bladder completely before instilling drug.   (2) Adjust pH (alkalinize for gemcitabine )   a. Give the patient 1300 mg of sodium bicarbonate (two 650 mg tabs) the night before and morning of therapy to alkalinize the urine.   (3)  Unlike BCG, mild gross hematuria is NOT a contraindication to use of chemo.   (4) Maintenance therapy: Monthly treatments for 12-24 months (24 months preferred - there is a 2.3 fold increase in the hazard ratio for cancer recurrence without maintenance)   This regimen has shown safety and efficacy in various NMIBC disease states including intermediate risk disease, high-risk BCG nave, high risk BCG failure x 1, high-risk BCG unresponsive.   (5) analgesic. Pyridium 200 mg the morning of treatment, then 3 times daily for 2 days as needed for pain   (6) oxybutynin 5 mg once as premed with compazine.  Assessment & Plan   No orders of the defined types were placed in this encounter.   The total time spent in the appointment was {CHL ONC TIME VISIT - DTPQU:[id number]} encounter with patients including review of chart and various tests results, discussions about plan of care and coordination of care plan   All questions were answered. The patient knows to call the clinic with any problems, questions or concerns. No barriers to learning was detected.  Pauletta JAYSON Chihuahua, MD 6/29/20253:14 PM  CHIEF COMPLAINTS/PURPOSE OF CONSULTATION:  ***  HISTORY OF PRESENTING ILLNESS:  ENDER RORKE 83 y.o. male is here because of *** I have reviewed his chart and materials related to his cancer extensively and collaborated history with the patient. Summary of  oncologic history is as follows: Oncology History   No history exists.    MEDICAL HISTORY:  Past Medical History:  Diagnosis Date   Actinic keratosis    Anginal pain (HCC)    Anxiety    Aortic ectasia, abdominal (HCC)    a.) CT abd 01/01/2022: 2.9 cm infrarenal abdominal aorta   Aortic stenosis    Bladder cancer (HCC) 2023   BPH (benign prostatic hyperplasia) 09/06/2007   CAD s/p CABG    a.) 1990 s/p MI--> CABG x 3; b.) LHC/PCI 2002 --> BMS (unk type) to LCx   Carotid arterial disease (HCC)    a.) 08/2016 Carotid U/S: <39% bilat; b.) carotid doppler 10/12/2022: mild-to-moderate (<50%) bilateral bifurcation plaque.   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 06/2019 Echo (in setting of COVID): EF 35-40%, mild LVH, g1 DD, glob HK; b. 09/2019 Echo: EF 50-55%, Gr1 DD; c. 03/2021 Echo: EF 50-55%, no rwma, mild LVH, GrI DD, nl RV fxn. RVSP 44.57mmHg. Mild-mod dil LA. Triv MR. Mild-mod Ao sclerosis.   History of 2019 novel coronavirus disease (COVID-19) 05/20/2019   History of hiatal hernia    History of shingles    HLD (hyperlipidemia) 04/26/2007   HTN (hypertension) 04/30/2007   LBBB (left bundle branch block)    Long-term use of aspirin  therapy    Lumbar disc disease with radiculopathy    Migraines    Myocardial infarction (HCC) 1990   a.) resulted in 3v CABG   Myocardial  infarction (HCC) 2002   a.) second cardiac event; PCI with placement of BMS (unknown type) to LCx   Neuropathy of both feet    OSA on CPAP    Osteoarthritis    PAD (peripheral artery disease) (HCC)    a. 07/2017 LE duplex: RSFA 75-69m, LSFA 75-88m, 50-74d; c. 08/2018 Periph Angio: No signif AoIliac dzs. Mod-sev Ca2+ RSFA w/ diff dzs throughout- 3 vessel runoff. Borderline signif LSFA dzs w/ mod-sev Ca2+ vessels and 3 vessel runoff below the knee-->Med rx.   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   Right mid dorsum forearm - in situ - EDC   T2DM (type 2 diabetes mellitus) (HCC)    Traumatic open wound of lower leg with delayed healing, left 12/28/2022    SURGICAL  HISTORY: Past Surgical History:  Procedure Laterality Date   ABDOMINAL AORTOGRAM W/LOWER EXTREMITY [name] 09/12/2018   Procedure: ABDOMINAL AORTOGRAM W/LOWER EXTREMITY;  Surgeon: Darron Deatrice LABOR, MD;  [facility]: [facility];  Service: Cardiovascular;  Laterality: [name];   ANTERIOR CERVICAL DECOMP/DISCECTOMY FUSION [name] 07/11/2022   Anterior Cervical Decompression Fusion ,Interboy Prothesis,Plate/Screws , Cervical four-five,Cervical five-six Gavin, Reyes, MD)   APPENDECTOMY     rupture   BACK SURGERY     1984 and then [facility]   BLADDER INSTILLATION [name] 10/19/2021   Procedure: BLADDER INSTILLATION OF GEMCITABINE ;  Surgeon: Twylla Glendia BROCKS, MD;  Location: [facility];  Service: Urology;  Laterality: [name];   BLADDER INSTILLATION [name] 11/15/2022   Procedure: BLADDER INSTILLATION OF GEMCITABINE ;  Surgeon: Twylla Glendia BROCKS, MD;  Location: [facility];  Service: Urology;  Laterality: [name];   CARDIAC CATHETERIZATION [name] 07/07/2015    Procedure: Left Heart Cath and Cors/Grafts Angiography;  Surgeon: Evalene JINNY Lunger, MD;  Location: [facility];  Service: Cardiovascular;  Laterality: [name];   CATARACT EXTRACTION, BILATERAL  2009   CERVICAL SPINE SURGERY  12/2016   cervical stenosis Gavin)   COLONOSCOPY  03/2010   HP polyp, diverticulosis, rpt 10 yrs ([name])   CORONARY ANGIOPLASTY  11/30/2000   LCX   CORONARY ARTERY BYPASS GRAFT  1990   3 vessel    CORONARY STENT INTERVENTION [name] 01/13/2023   Procedure: CORONARY STENT INTERVENTION;  Surgeon: Darron Deatrice LABOR, MD;  Location: [facility];  Service: Cardiovascular;  Laterality: [name];   CYSTOSCOPY  12/23/2010   [facility]   CYSTOSCOPY WITH BIOPSY [name] 01/16/2024   Procedure: CYSTOSCOPY, WITH BIOPSY;  Surgeon: Twylla Glendia BROCKS, MD;  Location: [facility];  Service: Urology;  Laterality: [name];   KNEE ARTHROSCOPY Right    x2   LAMINOTOMY  1986   L5/S1 lumbar laminotomy for two ruptured discs/fusion   LEFT HEART CATH AND CORS/GRAFTS ANGIOGRAPHY [name] 01/19/2024   Procedure: LEFT HEART CATH AND CORS/GRAFTS ANGIOGRAPHY;  Surgeon: Mady Bruckner, MD;  Location: [facility];  Service: Cardiovascular;  Laterality: [name];   LUMBAR LAMINECTOMY/DECOMPRESSION MICRODISCECTOMY [name] 07/13/2016   Procedure: LUMBAR TWO-THREE, LUMBAR THREE-FOUR, LUMBAR FOUR-FIVE LAMINECTOMY AND FORAMINOTOMY;  Surgeon: Reyes Budge, MD;  Location: [facility];  Service: Neurosurgery;  Laterality: [name];  LAMINECTOMY AND FORAMINOTOMY L2-L3, L3-L4,L4-L5   LUMBAR SPINE SURGERY  06/2020   Bilateral redo laminectomy/laminotomy/foraminotomies/medial facetectomy to decompress the bilateral L4 and L5 nerve roots Gavin)   REVERSE SHOULDER ARTHROPLASTY Right 12/21/2023   Procedure: ARTHROPLASTY, SHOULDER, TOTAL, REVERSE;  Surgeon: Melita Drivers, MD;  Location: [facility];  Service: Orthopedics;  Laterality: Right;   RIGHT/LEFT HEART CATH AND CORONARY ANGIOGRAPHY Bilateral 01/13/2023   Procedure: RIGHT/LEFT HEART  CATH AND CORONARY ANGIOGRAPHY;  Surgeon: Darron Deatrice LABOR, MD;  Location: [facility];  Service: Cardiovascular;  Laterality: Bilateral;   TOTAL HIP ARTHROPLASTY Bilateral 1999,2000   TOTAL KNEE ARTHROPLASTY Right 03/06/2017   Procedure: RIGHT TOTAL KNEE ARTHROPLASTY;  Surgeon: Melodi Lerner, MD;  Location: [facility];  Service: Orthopedics;  Laterality: Right;   TRANSURETHRAL RESECTION OF BLADDER TUMOR [name] 10/19/2021   Procedure: TRANSURETHRAL RESECTION OF BLADDER TUMOR (TURBT);  Surgeon: Twylla Glendia BROCKS, MD;  Location: [facility];  Service: Urology;  Laterality: [name];   TRANSURETHRAL RESECTION OF BLADDER TUMOR [name] 11/15/2022   Procedure: TRANSURETHRAL RESECTION OF BLADDER TUMOR (TURBT);  Surgeon: Twylla Glendia BROCKS, MD;  Location: [facility];  Service: Urology;  Laterality: [name];    SOCIAL HISTORY:  Social History   Socioeconomic History   Marital status: Married    Spouse name: [name]   Number of children: 1   Years of education: Not on file   Highest education level: Never attended school  Occupational History   Not on file  Tobacco Use   Smoking status: Former    Current packs/day: 0.00    Average packs/day: 1 pack/day for 25.0 years (25.0 ttl pk-yrs)    Types: Cigarettes    Start date: 08/16/1963    Quit date: 08/15/1988    Years since quitting: 35.5    Passive exposure: Past   Smokeless tobacco: Never  Vaping Use   Vaping status: Never Used  Substance and Sexual Activity   Alcohol use: No    Alcohol/week: 0.0 standard drinks of alcohol   Drug use: No   Sexual activity: Yes  Other Topics Concern   Not on file  Social History Narrative   Married, wife [name], for 25 years in 2016. 1 daughter and 3 grandkids from first marriage.    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    Diet: good water , fruits/vegetables daily      OSA eval  recommended while hospitalized with Covid 05/2019   Social Drivers of Health   Financial Resource Strain: Low Risk  (07/31/2023)   Overall Financial Resource Strain (CARDIA)    Difficulty of Paying Living Expenses: Not hard at all  Food Insecurity: No Food Insecurity (01/17/2024)   Hunger Vital Sign    Worried About Running Out of Food in the Last Year: Never true    Ran Out of Food in the Last Year: Never true  Transportation Needs: Patient Declined (01/17/2024)   PRAPARE - Transportation    Lack of Transportation (Medical): Patient declined    Lack of Transportation (Non-Medical): Patient declined  Physical Activity: Inactive (07/31/2023)   Exercise Vital Sign    Days of Exercise per Week: 0 days    Minutes of Exercise per Session: 10 min  Stress: No Stress Concern Present (05/25/2023)   [employer] of Occupational Health - Occupational Stress Questionnaire    Feeling of Stress : Not at all  Social Connections: Socially Integrated (01/17/2024)   Social Connection and Isolation Panel    Frequency of Communication with Friends and Family: Three times a week    Frequency of Social Gatherings with Friends and Family: Three times a week    Attends Religious Services: More than 4 times per year    Active Member of Clubs or Organizations: Patient declined    Attends Banker Meetings: More than 4 times per year    Marital Status: Married  Catering manager Violence: Patient Declined (01/17/2024)   Humiliation, Afraid, Rape, and Kick questionnaire    Fear of Current or Ex-Partner: Patient declined    Emotionally Abused: Patient declined    Physically Abused: Patient declined    Sexually Abused: Patient declined    FAMILY HISTORY: Family History  Problem Relation Age of Onset   Stroke Mother    Heart attack Mother    Diabetes Mother    Stroke Father    Heart disease Father    Polycystic kidney disease Father    Cancer Sister        throat   Polycystic kidney disease  Sister    Diabetes Brother  1/2 brother   Cancer Other        5/7 nephews with lung cancer    ALLERGIES:  is allergic to vioxx [rofecoxib], oxycodone , spiriva  respimat [tiotropium bromide monohydrate ], contrast media [iodinated contrast media], and morphine .  MEDICATIONS:  Current Outpatient Medications  Medication Sig Dispense Refill   albuterol  (PROVENTIL ) (2.5 MG/3ML) 0.083% nebulizer solution TAKE 3 MLS BY NEBULIZATION EVERY 6 HOURSAS NEEDED FOR WHEEZING OR SHORTNESS OF BREATH 75 mL 12   albuterol  (VENTOLIN  HFA) 108 (90 Base) MCG/ACT inhaler TAKE 2 PUFFS INTO LUNGS EVERY 6 HOURS ASNEEDED FOR WHEEZING OR SHORTNESS OF BREATH 8.5 each 3   aspirin  EC 81 MG EC tablet Take 1 tablet (81 mg total) by mouth daily. 30 tablet 0   atorvastatin  (LIPITOR) 20 MG tablet TAKE 1 TABLET BY MOUTH DAILY 90 tablet 3   Cyanocobalamin  (VITAMIN B-12 ER PO) Take 1 capsule by mouth daily.     docusate sodium  (COLACE) 100 MG capsule Take 1 capsule (100 mg total) by mouth daily as needed for mild constipation.     Dupilumab  (DUPIXENT ) 300 MG/2ML SOAJ Inject 300 mg into the skin every 14 (fourteen) days. **patient completed loading dose in clinic** 12 mL 1   ezetimibe  (ZETIA ) 10 MG tablet TAKE 1 TABLET BY MOUTH ONCE DAILY 90 tablet 3   famotidine  (PEPCID ) 20 MG tablet Take 20 mg by mouth daily.     fluorouracil  (EFUDEX ) 5 % cream Apply to nose twice a day x 7 days 30 g 2   fluticasone  (FLONASE ) 50 MCG/ACT nasal spray Place 1 spray into both nostrils daily as needed for allergies or rhinitis.     Fluticasone -Umeclidin-Vilant (TRELEGY ELLIPTA ) 200-62.5-25 MCG/ACT AEPB Inhale 1 puff into the lungs daily. 180 each 3   furosemide  (LASIX ) 40 MG tablet Take 0.5 tablets (20 mg total) by mouth daily as needed. Take 1 tablet (40 mg) daily as need for weight greater then 208. 180 tablet 3   gabapentin  (NEURONTIN ) 600 MG tablet Take 1 tablet (600 mg total) by mouth 3 (three) times daily. 270 tablet 4   hydrocortisone  2.5  % cream Apply to chin and beside the nose at bedtime Tues, Thurs, Sat 30 g 5   isosorbide  mononitrate (IMDUR ) 30 MG 24 hr tablet TAKE 1 TABLET BY MOUTH DAILY. 90 tablet 3   ketoconazole  (NIZORAL ) 2 % cream Apply to chin and beside the nose at bedtime Mon, Wed, Fri 15 g 0   losartan  (COZAAR ) 25 MG tablet Take 1 tablet (25 mg total) by mouth daily. 90 tablet 3   metoprolol  succinate (TOPROL  XL) 25 MG 24 hr tablet Take 0.5 tablets (12.5 mg total) by mouth daily. 45 tablet 3   montelukast  (SINGULAIR ) 10 MG tablet TAKE 1 TABLET BY MOUTH DAILY 30 tablet 11   mupirocin  ointment (BACTROBAN ) 2 % Apply 1 Application topically 2 (two) times daily. (Patient not taking: Reported on 01/30/2024) 22 g 0   nitroGLYCERIN  (NITROSTAT ) 0.4 MG SL tablet Place 1 tablet (0.4 mg total) under the tongue every 5 (five) minutes as needed for chest pain. 25 tablet 3   ondansetron  (ZOFRAN ) 4 MG tablet Take 1 tablet (4 mg total) by mouth every 8 (eight) hours as needed for nausea or vomiting. 10 tablet 0   polyethylene glycol (MIRALAX  / GLYCOLAX ) 17 g packet Take 17 g by mouth daily as needed for mild constipation.      ranolazine  (RANEXA ) 500 MG 12 hr tablet TAKE ONE (1) TABLET BY MOUTH TWO TIMES PER  DAY 180 tablet 3   spironolactone  (ALDACTONE ) 25 MG tablet Take 0.5 tablets (12.5 mg total) by mouth daily. 45 tablet 3   tamsulosin  (FLOMAX ) 0.4 MG CAPS capsule TAKE 1 CAPSULE BY MOUTH AT BEDTIME 90 capsule 1   No current facility-administered medications for this visit.    REVIEW OF SYSTEMS:   All relevant systems were reviewed with the patient and are negative.  PHYSICAL EXAMINATION: ECOG PERFORMANCE STATUS: {CHL ONC ECOG PS:[id number]}  There were no vitals filed for this visit. There were no vitals filed for this visit.  GENERAL: alert, no distress and comfortable SKIN: skin color is normal, no jaundice, rashes or significant lesions EYES: sclera clear OROPHARYNX: no exudate, no erythema NECK: supple LYMPH:  no  palpable lymphadenopathy in the cervical, axillary regions LUNGS: Effort normal, no respiratory distress.  Clear to auscultation bilaterally HEART: regular rate & rhythm and no lower extremity edema ABDOMEN: soft, non-tender and nondistended Musculoskeletal: no point tenderness NEURO: no focal motor/sensory deficits  LABORATORY DATA:  I have reviewed the data as listed Lab Results  Component Value Date   WBC 6.0 01/19/2024   HGB 11.5 (L) 01/19/2024   HCT 34.8 (L) 01/19/2024   MCV 96.7 01/19/2024   PLT 160 01/19/2024   Recent Labs    02/28/23 0911 08/11/23 1055 12/28/23 1601 01/10/24 1007 01/17/24 1156 01/18/24 0217 01/19/24 0046  NA 138   < > 135 139 138 139 139  K 4.2   < > 4.6 4.6 4.2 4.6 3.7  CL 106   < > 98 103 106 105 104  CO2 27   < > 27 28 22 25 26   GLUCOSE 124*   < > 126* 106* 124* 161* 122*  BUN 31*   < > 44* 28* 32* 38* 40*  CREATININE 1.28   < > 1.79* 1.55* 1.42* 1.50* 1.46*  CALCIUM  9.1   < > 8.8 9.1 8.8* 8.7* 8.5*  GFRNONAA  --    < >  --   --  49* 46* 48*  PROT 5.9*  --  6.6  --   --   --   --   ALBUMIN  4.1  --  3.7 4.1  --   --   --   AST 15  --  29  --   --   --   --   ALT 16  --  10  --   --   --   --   ALKPHOS 37*  --  35*  --   --   --   --   BILITOT 0.7  --  0.6  --   --   --   --    < > = values in this interval not displayed.    RADIOGRAPHIC STUDIES: I have personally reviewed the radiological images as listed and agreed with the findings in the report. CARDIAC CATHETERIZATION Result Date: 01/19/2024 Conclusions: Severe native coronary artery disease, as detailed below.  Compared to the last catheterization in 12/2022, there has been progression of mild to moderate LMCA disease, with up to 50-60% calcified stenosis now present.  There is also severe distal LCx and LPDA disease. Widely patent LIMA-LAD and sequential SVG-D1-D2. Widely patent proximal and mid LCx stents. Mildly elevated left ventricular filling pressure (LVEDP 20 mmHg). Recommendations:  Escalate antianginal therapy.  If patient continues to have refractory chest pain in spite of maximum tolerated doses of at least 2 antianginal agents, PCI to distal LCx +/- LMCA may need  to be considered. Continue aggressive secondary prevention of coronary artery disease. Lonni Hanson, MD [facility]  NM Myocar Multi W/Spect Marisela Motion / EF Result Date: 01/18/2024   Findings are consistent with infarction with peri-infarct ischemia. The study is high risk.   No ST deviation was noted.   LV perfusion is abnormal. There is evidence of ischemia. There is evidence of infarction. Defect 1: There is a large defect with moderate reduction in uptake present in the apical to basal anteroseptal location(s) that is partially reversible. There is abnormal wall motion in the defect area. Consistent with infarction and peri-infarct ischemia. Defect 2: There is a medium defect with moderate reduction in uptake present in the mid to basal inferolateral location(s) that is partially reversible. Consistent with infarction and peri-infarct ischemia.   Left ventricular function is abnormal. Global function is mildly reduced. Nuclear stress EF: 44%. End diastolic cavity size is mildly enlarged. End systolic cavity size is mildly enlarged.   When compared to previous study in 2024, the inferolateral defect seems to be improved.  However, anteroseptal partially reversible defect seems to be new.   ECHOCARDIOGRAM COMPLETE Result Date: 01/18/2024    ECHOCARDIOGRAM REPORT   Patient Name:   ABDELRAHMAN NAIR Date of Exam: 01/17/2024 Medical Rec #:  [id number]         Height:       71.7 in Accession #:    [id number]        Weight:       212.3 lb Date of Birth:  1940/08/23         BSA:          2.179 m Patient Age:    82 years          BP:           160/62 mmHg Patient Gender: M                 HR:           71 bpm. Exam Location:  [facility] Procedure: 2D Echo, Cardiac Doppler and Color Doppler (Both Spectral and Color            Flow  Doppler were utilized during procedure). Indications:     R94.31 Abnormal EKG  History:         Patient has prior history of Echocardiogram examinations, most                  recent 09/05/2023. CAD and Previous Myocardial Infarction, Prior                  CABG, Arrythmias:LBBB, Signs/Symptoms:Murmur; Risk                  Factors:Hypertension, Dyslipidemia and Diabetes. Obstructive                  sleep apnea-CPAP. Palpitations. Emphysema. Chronic Kidney                  Disease.  Sonographer:     Carl Coma RDCS Referring Phys:  [id number] CHAPMAN ROTA Diagnosing Phys: Deatrice Cage MD IMPRESSIONS  1. Left ventricular ejection fraction, by estimation, is 50 to 55%. The left ventricle has low normal function. The left ventricle has no regional wall motion abnormalities. The left ventricular internal cavity size was mildly dilated. Left ventricular diastolic parameters were normal.  2. Right ventricular systolic function is normal. The right ventricular size is normal. Tricuspid regurgitation signal is inadequate for assessing  PA pressure.  3. Left atrial size was mildly dilated.  4. The mitral valve is normal in structure. Mild mitral valve regurgitation. No evidence of mitral stenosis.  5. The aortic valve is calcified. Aortic valve regurgitation is not visualized. Mild to moderate aortic valve stenosis. Aortic valve area, by VTI measures 1.16 cm. Aortic valve mean gradient measures 11.2 mmHg. FINDINGS  Left Ventricle: Left ventricular ejection fraction, by estimation, is 50 to 55%. The left ventricle has low normal function. The left ventricle has no regional wall motion abnormalities. The left ventricular internal cavity size was mildly dilated. There is no left ventricular hypertrophy. Left ventricular diastolic parameters were normal. Right Ventricle: The right ventricular size is normal. No increase in right ventricular wall thickness. Right ventricular systolic function is normal. Tricuspid  regurgitation signal is inadequate for assessing PA pressure. Left Atrium: Left atrial size was mildly dilated. Right Atrium: Right atrial size was normal in size. Pericardium: There is no evidence of pericardial effusion. Mitral Valve: The mitral valve is normal in structure. Mild mitral annular calcification. Mild mitral valve regurgitation. No evidence of mitral valve stenosis. Tricuspid Valve: The tricuspid valve is normal in structure. Tricuspid valve regurgitation is trivial. No evidence of tricuspid stenosis. The aortic valve is calcified. Aortic valve regurgitation is not visualized. Mild to moderate aortic stenosis is present. Pulmonic Valve: The pulmonic valve was normal in structure. Pulmonic valve regurgitation is mild. No evidence of pulmonic stenosis. Aorta: The aortic root is normal in size and structure. Venous: The inferior vena cava was not well visualized. IAS/Shunts: No atrial level shunt detected by color flow Doppler.  LEFT VENTRICLE PLAX 2D LVIDd:         5.80 cm   Diastology LVIDs:         4.00 cm   LV e' medial:    5.44 cm/s LV PW:         0.90 cm   LV E/e' medial:  19.5 LV IVS:        0.80 cm   LV e' lateral:   11.10 cm/s LVOT diam:     2.20 cm   LV E/e' lateral: 9.5 LV SV:         59 LV SV Index:   27 LVOT Area:     3.80 cm  RIGHT VENTRICLE RV Basal diam:  4.10 cm RV S prime:     13.87 cm/s TAPSE (M-mode): 2.5 cm LEFT ATRIUM             Index        RIGHT ATRIUM           Index LA diam:        5.10 cm 2.34 cm/m   RA Area:     16.30 cm LA Vol (A2C):   73.4 ml 33.68 ml/m  RA Volume:   47.10 ml  21.61 ml/m LA Vol (A4C):   68.5 ml 31.43 ml/m LA Biplane Vol: 73.6 ml 33.77 ml/m  AORTIC VALVE AV Area (Vmax):    1.34 cm AV Area (Vmean):   1.25 cm AV Area (VTI):     1.16 cm AV Vmax:           221.00 cm/s AV Vmean:          157.800 cm/s AV VTI:            0.513 m AV Peak Grad:      19.5 mmHg AV Mean Grad:      11.2 mmHg LVOT  Vmax:         77.70 cm/s LVOT Vmean:        51.900 cm/s LVOT VTI:           0.156 m LVOT/AV VTI ratio: 0.30  AORTA Ao Root diam: 3.40 cm Ao Asc diam:  3.30 cm MITRAL VALVE MV Area (PHT): 4.06 cm     SHUNTS MV Decel Time: 187 msec     Systemic VTI:  0.16 m MV E velocity: 106.00 cm/s  Systemic Diam: 2.20 cm MV A velocity: 121.33 cm/s MV E/A ratio:  0.87 Deatrice Cage MD Electronically signed by Deatrice Cage MD Signature Date/Time: 01/18/2024/11:05:35 AM    Final    CT Angio Chest Pulmonary Embolism (PE) W or WO Contrast Result Date: 01/17/2024 CLINICAL DATA:  Chest pain and shortness of breath. EXAM: CT ANGIOGRAPHY CHEST WITH CONTRAST TECHNIQUE: Multidetector CT imaging of the chest was performed using the standard protocol during bolus administration of intravenous contrast. Multiplanar CT image reconstructions and MIPs were obtained to evaluate the vascular anatomy. RADIATION DOSE REDUCTION: This exam was performed according to the departmental dose-optimization program which includes automated exposure control, adjustment of the mA and/or kV according to patient size and/or use of iterative reconstruction technique. CONTRAST:  75mL OMNIPAQUE  IOHEXOL  350 MG/ML SOLN COMPARISON:  Radiograph earlier today.  Chest CT 04/20/2021 FINDINGS: Cardiovascular: There are no filling defects within the pulmonary arteries to suggest pulmonary embolus. The heart is mildly enlarged. Aortic atherosclerosis. There is no contrast in the aorta. No pericardial effusion. Status post CABG with calcification of native coronary arteries. Mediastinum/Nodes: No suspicious mediastinal or hilar lymphadenopathy. Unremarkable appearance of the esophagus. No thyroid  nodule. Lungs/Pleura: Emphysema with bronchial thickening. Patchy areas of ground-glass opacity within the lower lobes, right middle lobe and lingula. This is slightly more prominent than on prior exam. No confluent airspace disease. No pleural fluid. Upper Abdomen: No acute upper abdominal findings. Musculoskeletal: Reverse right shoulder  arthroplasty. Median sternotomy. Review of the MIP images confirms the above findings. IMPRESSION: 1. No pulmonary embolus. 2. Patchy areas of ground-glass opacity within the lower lobes, right middle lobe and lingula, slightly more prominent than on prior exam. This is nonspecific, but can be seen with atypical infection or inflammation. This is likely superimposed on scarring. Aortic Atherosclerosis (ICD10-I70.0) and Emphysema (ICD10-J43.9). Electronically Signed   By: Andrea Gasman M.D.   On: 01/17/2024 22:28   DG Chest 2 View Result Date: 01/17/2024 CLINICAL DATA:  Chest pain. EXAM: CHEST - 2 VIEW COMPARISON:  October 24, 2023. FINDINGS: The heart size and mediastinal contours are within normal limits. Both lungs are clear. Status post coronary artery bypass graft and right shoulder arthroplasty. IMPRESSION: No active cardiopulmonary disease. Electronically Signed   By: Lynwood Landy Raddle M.D.   On: 01/17/2024 12:28

## 2024-02-11 NOTE — Assessment & Plan Note (Signed)
 Teaching to be completed Treatment plan entered Prescribed Pyridium 200 mg the morning of treatment, then 3 times daily for 2 days as needed for pain Prescribed 1300 mg of sodium bicarbonate (two 650 mg tabs) the night before and morning of therapy to alkalinize the urine. Premed with oxybutynin and compazine Plan to start treatment in 2-3 weeks Return weekly with lab and MD visit during induction Follow up with cystoscopy every 3 months.

## 2024-02-12 ENCOUNTER — Inpatient Hospital Stay

## 2024-02-12 VITALS — BP 132/50 | HR 72 | Temp 97.8°F | Resp 16 | Ht 71.75 in | Wt 208.3 lb

## 2024-02-12 DIAGNOSIS — Z87891 Personal history of nicotine dependence: Secondary | ICD-10-CM | POA: Diagnosis not present

## 2024-02-12 DIAGNOSIS — C679 Malignant neoplasm of bladder, unspecified: Secondary | ICD-10-CM | POA: Diagnosis not present

## 2024-02-12 NOTE — Patient Instructions (Signed)
 Will plan to start in about a month from now. Will schedule teaching and follow up appointment.  Gemcitabine  - Docetaxel (Gem-Doce) to the bladder only.   - Induction- Weekly x 6 weeks   - Maintenance- about Monthly x 24 months with cystoscopy every 3 months.     Key Points:   (1) Minimize drug dilution   a. Overnight fast/dehydration - night before treatment   b. Avoid all diuretics (including alcohol & coffee) - within 8 hours of treatment   c. Empty the bladder completely    (2) Take 1300 mg of sodium bicarbonate (two 650 mg tabs) the night before and morning of therapy to alkalinize the urine.   (3) analgesic. Pyridium 200 mg the morning of treatment, then 3 times daily for 2 days as needed for pain   (4) oxybutynin 5 mg once as premed with compazine.

## 2024-02-13 ENCOUNTER — Ambulatory Visit: Admitting: Physical Therapy

## 2024-02-13 ENCOUNTER — Other Ambulatory Visit: Payer: Self-pay

## 2024-02-15 ENCOUNTER — Ambulatory Visit: Attending: Orthopedic Surgery | Admitting: Physical Therapy

## 2024-02-15 DIAGNOSIS — M25611 Stiffness of right shoulder, not elsewhere classified: Secondary | ICD-10-CM | POA: Diagnosis not present

## 2024-02-15 NOTE — Therapy (Signed)
 OUTPATIENT PHYSICAL THERAPY TREATMENT  Patient Name: [name] MRN: [id number] DOB:February 03, 1941, 83 y.o., male Today's Date: 02/15/2024  END OF SESSION:  PT End of Session - 02/15/24 1316     Visit Number 4    Number of Visits 16    Date for PT Re-Evaluation 03/19/24    Authorization Type HTA    Authorization Time Period 01/23/24-03/19/24    Authorization - Number of Visits 4    Progress Note Due on Visit 10    PT Start Time 1315    PT Stop Time 1345    PT Time Calculation (min) 30 min    Activity Tolerance Patient tolerated treatment well    Behavior During Therapy [facility] for tasks assessed/performed          Past Medical History:  Diagnosis Date   Actinic keratosis    Anginal pain (HCC)    Anxiety    Aortic ectasia, abdominal (HCC)    a.) CT abd 01/01/2022: 2.9 cm infrarenal abdominal aorta   Aortic stenosis    Bladder cancer (HCC) 2023   BPH (benign prostatic hyperplasia) 09/06/2007   CAD s/p CABG    a.) 1990 s/p MI--> CABG x 3; b.) LHC/PCI 2002 --> BMS (unk type) to LCx   Carotid arterial disease (HCC)    a.) 08/2016 Carotid U/S: <39% bilat; b.) carotid doppler 10/12/2022: mild-to-moderate (<50%) bilateral bifurcation plaque.   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 06/2019 Echo (in setting of COVID): EF 35-40%, mild LVH, g1 DD, glob HK; b. 09/2019 Echo:  EF 50-55%, Gr1 DD; c. 03/2021 Echo: EF 50-55%, no rwma, mild LVH, GrI DD, nl RV fxn. RVSP 44.55mmHg. Mild-mod dil LA. Triv MR. Mild-mod Ao sclerosis.   History of 2019 novel coronavirus disease (COVID-19) 05/20/2019   History of hiatal hernia    History of shingles    HLD (hyperlipidemia) 04/26/2007   HTN (hypertension) 04/30/2007   LBBB (left bundle branch block)    Long-term use of aspirin  therapy    Lumbar disc disease with radiculopathy    Migraines    Myocardial infarction (HCC) 1990   a.) resulted in 3v CABG   Myocardial infarction (HCC) 2002   a.) second cardiac event; PCI with placement of BMS (unknown type) to LCx   Neuropathy of both feet    OSA on CPAP    Osteoarthritis    PAD (peripheral artery disease) (HCC)    a. 07/2017 LE duplex: RSFA 75-16m, LSFA 75-57m, 50-74d; c. 08/2018 Periph Angio: No signif AoIliac dzs. Mod-sev Ca2+ RSFA w/ diff dzs throughout- 3 vessel runoff. Borderline signif LSFA dzs w/ mod-sev Ca2+ vessels and 3 vessel runoff below the knee-->Med rx.   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   Right mid dorsum forearm - in situ - EDC   T2DM (type 2 diabetes mellitus) (HCC)    Traumatic open wound of lower leg with delayed healing, left 12/28/2022   Past Surgical History:  Procedure Laterality Date   ABDOMINAL AORTOGRAM W/LOWER EXTREMITY [name] 09/12/2018   Procedure: ABDOMINAL AORTOGRAM W/LOWER EXTREMITY;  Surgeon: Darron Deatrice LABOR, MD;  [facility]: [facility];  Service: Cardiovascular;  Laterality: [name];   ANTERIOR CERVICAL DECOMP/DISCECTOMY FUSION [name] 07/11/2022   Anterior Cervical Decompression Fusion ,Interboy Prothesis,Plate/Screws , Cervical  four-five,Cervical five-six Gavin, Reyes, MD)   APPENDECTOMY     rupture   BACK SURGERY     1984 and then another one [facility]   BLADDER INSTILLATION [name] 10/19/2021   Procedure: BLADDER INSTILLATION OF GEMCITABINE ;  Surgeon: Twylla Glendia BROCKS, MD;  Location: [facility];  Service: Urology;  Laterality: [name];   BLADDER INSTILLATION [name] 11/15/2022   Procedure: BLADDER INSTILLATION OF GEMCITABINE ;  Surgeon: Twylla Glendia BROCKS, MD;  Location: [facility];  Service: Urology;  Laterality: [name];   CARDIAC CATHETERIZATION [name] 07/07/2015   Procedure: Left Heart Cath and Cors/Grafts Angiography;  Surgeon: Evalene JINNY Lunger, MD;  Location: [facility];  Service: Cardiovascular;  Laterality: [name];   CATARACT EXTRACTION, BILATERAL  2009   CERVICAL SPINE SURGERY  12/2016   cervical stenosis Gavin)   COLONOSCOPY  03/2010   HP polyp, diverticulosis, rpt 10 yrs ([name])   CORONARY ANGIOPLASTY  11/30/2000   LCX   CORONARY ARTERY BYPASS GRAFT  1990   3 vessel    CORONARY STENT INTERVENTION [name] 01/13/2023   Procedure: CORONARY STENT INTERVENTION;  Surgeon: Darron Deatrice LABOR, MD;  Location: [facility];  Service: Cardiovascular;  Laterality: [name];   CYSTOSCOPY  12/23/2010   [facility]   CYSTOSCOPY WITH BIOPSY [name] 01/16/2024   Procedure: CYSTOSCOPY, WITH BIOPSY;  Surgeon: Twylla Glendia BROCKS, MD;  Location: [facility];  Service: Urology;  Laterality: [name];   KNEE ARTHROSCOPY Right    x2   LAMINOTOMY  1986   L5/S1 lumbar laminotomy for two ruptured discs/fusion   LEFT HEART CATH AND CORS/GRAFTS ANGIOGRAPHY [name] 01/19/2024   Procedure: LEFT HEART CATH AND CORS/GRAFTS ANGIOGRAPHY;  Surgeon: Mady Bruckner, MD;  Location: [facility];  Service: Cardiovascular;  Laterality: [name];   LUMBAR LAMINECTOMY/DECOMPRESSION MICRODISCECTOMY [name] 07/13/2016   Procedure: LUMBAR TWO-THREE, LUMBAR THREE-FOUR, LUMBAR FOUR-FIVE LAMINECTOMY AND FORAMINOTOMY;  Surgeon: Reyes Budge, MD;  Location: [facility];  Service:  Neurosurgery;  Laterality: [name];  LAMINECTOMY AND FORAMINOTOMY L2-L3, L3-L4,L4-L5   LUMBAR SPINE SURGERY  06/2020   Bilateral redo laminectomy/laminotomy/foraminotomies/medial facetectomy to decompress the bilateral L4 and L5 nerve roots Gavin)   REVERSE SHOULDER ARTHROPLASTY Right 12/21/2023   Procedure: ARTHROPLASTY, SHOULDER, TOTAL, REVERSE;  Surgeon: Melita Drivers, MD;  Location: [facility];  Service: Orthopedics;  Laterality: Right;   RIGHT/LEFT HEART CATH AND CORONARY ANGIOGRAPHY Bilateral 01/13/2023   Procedure: RIGHT/LEFT HEART CATH AND CORONARY ANGIOGRAPHY;  Surgeon: Darron Deatrice LABOR, MD;  Location: [facility];  Service: Cardiovascular;  Laterality: Bilateral;   TOTAL HIP ARTHROPLASTY Bilateral 1999,2000   TOTAL KNEE ARTHROPLASTY Right 03/06/2017   Procedure: RIGHT TOTAL KNEE ARTHROPLASTY;  Surgeon: Melodi Lerner, MD;  Location: [facility];  Service:  Orthopedics;  Laterality: Right;   TRANSURETHRAL RESECTION OF BLADDER TUMOR [name] 10/19/2021   Procedure: TRANSURETHRAL RESECTION OF BLADDER TUMOR (TURBT);  Surgeon: Twylla Glendia BROCKS, MD;  Location: [facility];  Service: Urology;  Laterality: [name];   TRANSURETHRAL RESECTION OF BLADDER TUMOR [name] 11/15/2022   Procedure: TRANSURETHRAL RESECTION OF BLADDER TUMOR (TURBT);  Surgeon: Twylla Glendia BROCKS, MD;  Location: [facility];  Service: Urology;  Laterality: [name];   Patient Active Problem List   Diagnosis Date Noted   HFrEF (heart failure with reduced ejection fraction) (HCC) 01/05/2024   Urinary incontinence 01/05/2024   Rotator cuff tear arthropathy of right shoulder 09/08/2023   Sinus headache 08/02/2023   Bradycardia 08/02/2023   Abnormal stress test 01/14/2023   Angina pectoris (HCC) 01/13/2023   Retinal artery occlusion, branch, 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   Cervical spondylosis with radiculopathy 07/11/2022   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   OSA (obstructive sleep apnea)    Chronic heart failure with preserved ejection fraction  (HFpEF) (HCC)    Nocturnal hypoxia 06/11/2021   CKD (chronic kidney disease) stage 3, GFR 30-59 ml/min (HCC) 02/24/2021   Peripheral neuropathy 02/23/2021   Spondylolisthesis of lumbar region 07/07/2020   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   Coccydynia 09/20/2019   Nasal sinus congestion 07/19/2019   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   Anxiety 06/04/2019   Chronic right shoulder pain 09/10/2018   Cervical stenosis of spinal canal 06/05/2017   Peripheral edema 06/05/2017   Health maintenance examination 12/06/2016   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/13/2016   Overweight (BMI 25.0-29.9) 07/04/2016   Low vitamin B12 level 01/01/2016   Exertional dyspnea 07/07/2015   Unstable angina (HCC)    Medicare annual wellness visit, subsequent 07/03/2015   Advanced care planning/counseling discussion 07/03/2015   Type 2 diabetes mellitus with other specified complication (HCC) 05/04/2015   Pleuritic chest pain 05/04/2015   Ex-smoker 05/04/2015   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   Spermatocele 04/02/2013   Syncopal vertigo 11/04/2010   Lumbar disc disease with radiculopathy 11/04/2010   Serous otitis media 06/07/2010   Coronary artery disease involving native coronary artery of native heart with unstable angina pectoris (HCC) 08/11/2009   PAD (peripheral artery disease) (HCC) 08/11/2009   DUPUYTREN'S CONTRACTURE, 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   Carotid stenosis 07/09/2008   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   Benign prostatic hyperplasia with urinary obstruction 09/06/2007   Essential hypertension 04/30/2007   GERD 04/30/2007   Hyperlipidemia associated with type 2 diabetes mellitus (HCC) 04/26/2007   OA (osteoarthritis) of knee 04/26/2007   DVT, HX OF 04/26/2007    PCP: Rilla Baller, MD REFERRING PROVIDER: Franky Pointer, MD (ortho)  REFERRING DIAG: s/p Rt rTSA  THERAPY DIAG:  Stiffness of right shoulder, not elsewhere classified Rationale for  Evaluation and Treatment: Rehabilitation ONSET DATE:   SUBJECTIVE:  SUBJECTIVE STATEMENT: Pt reports that he is experiencing a lot of stress in his life because of having to take care of his wife.   Hand dominance: right  PERTINENT HISTORY: Reverse TSA wiith Dr Melita 2nd week of May 2025. Pt wore sling x 2 weeks, then DC. Pt repors use of polar care, also says he's been moving his arm around a lot and its doing well. Pain controlled. Pt not aware of anti extension, anti IR precautions, but reports he would never do these anyway. Pt just DC from hospital 5 days prior to evaluation here after cardiac chest pain problem, underwent left radial cath, was on heaprin x3 days, now back home.  PAIN:  Are you having pain? No PRECAUTIONS: No shoulder extension, no IR+ADD shoulder right  WEIGHT BEARING RESTRICTIONS: No lifting, no body weight bearing  FALLS:  Has patient fallen in last 6 months? No PATIENT GOALS: improved arm function   NEXT MD VISIT: 2nd week June 2025  OBJECTIVE:  Note: Objective measures were completed at Evaluation unless otherwise noted.  PATIENT SURVEYS :  QUIcK DASH: 70.5% (01/25/24)   UPPER EXTREMITY ROM:    ROM Right eval Right  01/25/24  Shoulder flexion 90 degrees  108 degrees  Shoulder extension prohibited --  Shoulder abduction 45 degrees 78 degrees  Shoulder internal rotation Full  --  Shoulder external rotation -20 degrees --  Elbow flexion Full  Full  Elbow extension Full  Full   (Blank rows = not tested)  PALPATION:  General anterior upper chest tightness and focal edema near surgical scar and pec major  TODAY'S TREATMENT   02/15/24: THEREX: All single UE exercises on RUE              Shoulder Pulleys AAROM Flexion and Extension x 30              Shoulder Pulleys AAROM  Abduction and Adduction x 30             Seated Shoulder AAROM Flexion and Extension with using wand 2 x 10              -Able to reach to 110 degrees before feeling restriction.             Seated Shoulder AAROM Abduction and Abduction with using wand 2 x 10             -Able to reach to 110 degrees before feeling restriction              Shoulder AROM                -Flex R/L 120/160              -Abd R/L  80/ 160               -ER in scapular plane at 60 deg  R/L 15 deg/NT            Seated Combined Shoulder ER at 60 deg AAROM 2 x 10           Seated Combined  Shoulder ER Stretch 3 x 30 sec           Seated Shoulder ER with shoulder at 70 deg abd AAROM 2 x 10     02/08/24  THEREX: All single UE exercises on RUE              Shoulder Pulleys AAROM Flexion and Extension x 30  Shoulder Pulleys AAROM Abduction and Adduction x 30             Supine  Shoulder Flexion AAROM with #5 AW on wand 2 x 10 with 5 sec hold at terminal flexion              Shoulder Flexion AROM with flexed elbow 1 x 10              Shoulder Abd AROM with extended elbow 2 x 10               -min VC to avoid lateral trunk flexion             Left Side Lying ER at 0 deg Abd with #1 DB 3  x 10              -min VC to maintain elbow flexion at 90 deg      PATIENT EDUCATION: Education details: See above  Person educated: Runner, broadcasting/film/video: collaborative learning, deliberate practice, positive reinforcement, explicit instruction, establish rules. Education comprehension: fair   HOME EXERCISE PROGRAM: Access Code: [email or web address] URL: [email or web address] Date: 02/15/2024 Prepared by: Toribio Servant  Program Notes Shoulder Slutes on right shoulder 2 x 10  3-4 days per week   Exercises - Seated Upper Trapezius Stretch  - 1 x daily - 7 x weekly - 3 reps - 60 sec  hold - Seated Overhead Shoulder External Rotation Stretch with Towel  - 1 x daily - 7 x weekly - 3 reps - 30-60 sec hold -  Standing 'L' Stretch at [employer]  - 5 x daily - 15 reps - 5sec hold - Seated Shoulder Flexion AAROM with Pulley Behind  - 1 x daily - 7 x weekly - 3 sets - 10 reps - Seated Shoulder Abduction AAROM with Pulley Behind  - 1 x daily - 7 x weekly - 3 sets - 10 reps - Seated Scapular Retraction  - 5 x daily - 1 sets - 15 reps - 3sec hold - Sidelying Shoulder ER with Towel and Dumbbell  - 3-4 x weekly - 2 sets - 10 reps - Seated Shoulder Flexion AAROM with Dowel  - 1 x daily - 7 x weekly - 2 sets - 10 reps - Seated Shoulder Abduction AAROM with Dowel  - 3-4 x weekly - 3 sets - 10 reps - Seated Single Arm Shoulder Lateral Raise  - 3-4 x weekly - 3 sets - 10 reps  ASSESSMENT:  CLINICAL IMPRESSION: Pt is now 7 weeks post op right reverse total shoulder. He is now in phase III or intermediate post op phase. He shows improved right shoulder AROM with his largest ROM restriction in external rotation. Modified exercises to include seated positions to avoid having patient in supine, because this position flares up his COPD. He continues to show excellent response to activity with no increase in pain. Pt has expressed additional stressors including additional caregiver responsibilities given wife's recent surgery and that he will need undergo chemotherapy treatments starting in August. Pt will continue to benefit from skilled PT intervention to improve ROM and strength in RUE in order to restore to PLOF in ADL, IADL, and leisure activity.    OBJECTIVE IMPAIRMENTS: decreased activity tolerance, decreased knowledge of condition, decreased knowledge of use of DME, decreased mobility, decreased ROM, and decreased strength.  ACTIVITY LIMITATIONS: carrying, lifting, sleeping, bathing, toileting, dressing, and reach over head PARTICIPATION LIMITATIONS: meal prep, cleaning, laundry, medication management, interpersonal relationship, driving, shopping,  and community activity PERSONAL FACTORS: Age, Behavior pattern,  Education, Fitness, Past/current experiences, Profession, and Social background are also affecting patient's functional outcome.  REHAB POTENTIAL: Fair   CLINICAL DECISION MAKING: Medium  EVALUATION COMPLEXITY: Moderate  GOALS: Goals reviewed with patient? No  SHORT TERM GOALS: Target date: 02/22/24  Patient will report comprehension, confidence, and consistent compliance and of a simple home exercise program established to facilitate symptoms management and basic strengthening and/or segment mobility.   Baseline: issued at visit 1 and 2  Goal status: MET    2.  Patient to demonstrate improved score on self-report measure by >9% to indicate reduced self-reported disability and/or pain.    Baseline:  Quick DASH 70.5% Goal status: Progressing   LONG TERM GOALS: Target date: 03/19/24  Patient to improve score on self-report measure by 20% or greater to indicate reduced disability and improved quality of life.   Baseline: Quickash 70.5% Goal status: Progressing   2.  Patient to demonstrate improvement in strength testing by minimum of 1 grade to indicate improved motor performance.   Baseline: 3/5 or less all groups  Goal status: Progressing   3.  Patient to demonstrate improvement in RUE shoulder A/ROM without pain limitations >100 degrees flexion, 75 degrees ABDCT. Baseline: Shoulder Flex R/L 120/180, Shoulder Abd R/L 80/NT, Shoulder ER at 60 deg abd R/L 15/ NT  Goal status: Progressing   PLAN: PT FREQUENCY: 1-2x/week PT DURATION: 8 weeks PLANNED INTERVENTIONS: 97750- Physical Performance Testing, 97110-Therapeutic exercises, 97530- Therapeutic activity, [id number]- Neuromuscular re-education, 97535- Self Care, [id number]- Manual therapy, [id number]- Electrical stimulation (unattended), [id number]- Electrical stimulation (manual), Patient/Family education, Cryotherapy, and Moist heat  PLAN FOR NEXT SESSION: Assess right shoulder A/PROM and strength. Progress periscapular exercises: scapular squeezes with  resistance, low row and inferior glide. Ball roll on wall. Banded front and lateral shoulder raises.    Toribio Servant PT, DPT  [facility] [address], [place], [address] Phone: [phone]   Fax:  [phone]

## 2024-02-20 ENCOUNTER — Ambulatory Visit: Admitting: Physical Therapy

## 2024-02-20 ENCOUNTER — Encounter: Payer: Self-pay | Admitting: Physical Therapy

## 2024-02-20 DIAGNOSIS — M25611 Stiffness of right shoulder, not elsewhere classified: Secondary | ICD-10-CM | POA: Diagnosis not present

## 2024-02-20 NOTE — Therapy (Signed)
 OUTPATIENT PHYSICAL THERAPY TREATMENT  Patient Name: [name] MRN: [id number] DOB:1941-04-21, 83 y.o., male Today's Date: 02/20/2024  END OF SESSION:   02/20/24 1308  PT Visits / Re-Eval  Visit Number 5  Number of Visits 16  Date for PT Re-Evaluation 03/19/24  Authorization  Authorization Type HTA  Authorization Time Period 01/23/24-03/19/24  Authorization - Visit Number 5  Authorization - Number of Visits 20  Progress Note Due on Visit 10  PT Time Calculation  PT Start Time 1305  PT Stop Time 1345  PT Time Calculation (min) 40 min  PT - End of Session  Activity Tolerance Patient tolerated treatment well  Behavior During Therapy [facility] for tasks assessed/performed     Past Medical History:  Diagnosis Date   Actinic keratosis    Anginal pain (HCC)    Anxiety    Aortic ectasia, abdominal (HCC)    a.) CT abd 01/01/2022: 2.9 cm infrarenal abdominal aorta   Aortic stenosis    Bladder cancer (HCC) 2023   BPH (benign prostatic hyperplasia) 09/06/2007   CAD s/p CABG    a.) 1990 s/p MI--> CABG x 3; b.) LHC/PCI 2002 --> BMS (unk type) to LCx   Carotid arterial disease (HCC)    a.) 08/2016 Carotid U/S: <39% bilat; b.) carotid doppler 10/12/2022: mild-to-moderate (<50%) bilateral bifurcation plaque.   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 06/2019 Echo (in setting  of COVID): EF 35-40%, mild LVH, g1 DD, glob HK; b. 09/2019 Echo: EF 50-55%, Gr1 DD; c. 03/2021 Echo: EF 50-55%, no rwma, mild LVH, GrI DD, nl RV fxn. RVSP 44.87mmHg. Mild-mod dil LA. Triv MR. Mild-mod Ao sclerosis.   History of 2019 novel coronavirus disease (COVID-19) 05/20/2019   History of hiatal hernia    History of shingles    HLD (hyperlipidemia) 04/26/2007   HTN (hypertension) 04/30/2007   LBBB (left bundle branch block)    Long-term use of aspirin  therapy    Lumbar disc disease with radiculopathy    Migraines    Myocardial infarction (HCC) 1990   a.) resulted in 3v CABG   Myocardial infarction (HCC) 2002   a.) second cardiac event; PCI with placement of BMS (unknown type) to LCx   Neuropathy of both feet    OSA on CPAP    Osteoarthritis    PAD (peripheral artery disease) (HCC)    a. 07/2017 LE duplex: RSFA 75-48m, LSFA 75-49m, 50-74d; c. 08/2018 Periph Angio: No signif AoIliac dzs. Mod-sev Ca2+ RSFA w/ diff dzs throughout- 3 vessel runoff. Borderline signif LSFA dzs w/ mod-sev Ca2+ vessels and 3 vessel runoff below the knee-->Med rx.   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   Right mid dorsum forearm - in situ - EDC   T2DM (type 2 diabetes mellitus) (HCC)    Traumatic open wound of lower leg with delayed healing, left 12/28/2022   Past Surgical History:  Procedure Laterality Date   ABDOMINAL AORTOGRAM W/LOWER EXTREMITY [name] 09/12/2018   Procedure: ABDOMINAL AORTOGRAM W/LOWER EXTREMITY;  Surgeon: Darron Deatrice LABOR, MD;  [facility];  Service: Cardiovascular;  Laterality: [name];   ANTERIOR CERVICAL DECOMP/DISCECTOMY FUSION [name] 07/11/2022   Anterior Cervical Decompression  Fusion ,Interboy Prothesis,Plate/Screws , Cervical four-five,Cervical five-six Gavin, Reyes, MD)   APPENDECTOMY     rupture   BACK SURGERY     1984 and then another one [facility]   BLADDER INSTILLATION [name] 10/19/2021   Procedure: BLADDER INSTILLATION OF GEMCITABINE ;  Surgeon: Twylla Glendia BROCKS, MD;  Location: [facility];  Service: Urology;  Laterality: [name];   BLADDER INSTILLATION [name] 11/15/2022   Procedure: BLADDER INSTILLATION OF GEMCITABINE ;  Surgeon: Twylla Glendia BROCKS, MD;  Location: [facility];  Service: Urology;  Laterality: [name];   CARDIAC CATHETERIZATION [name] 07/07/2015   Procedure: Left Heart Cath and Cors/Grafts Angiography;  Surgeon: Evalene JINNY Lunger, MD;  Location: [facility];  Service: Cardiovascular;  Laterality: [name];   CATARACT EXTRACTION, BILATERAL  2009   CERVICAL SPINE SURGERY  12/2016   cervical stenosis Gavin)   COLONOSCOPY  03/2010   HP polyp, diverticulosis, rpt 10 yrs ([name])   CORONARY ANGIOPLASTY  11/30/2000   LCX   CORONARY ARTERY BYPASS GRAFT  1990   3 vessel    CORONARY STENT INTERVENTION [name] 01/13/2023   Procedure: CORONARY STENT INTERVENTION;  Surgeon: Darron Deatrice LABOR, MD;  Location: [facility];  Service: Cardiovascular;  Laterality: [name];   CYSTOSCOPY  12/23/2010   [facility]   CYSTOSCOPY WITH BIOPSY [name] 01/16/2024   Procedure: CYSTOSCOPY, WITH BIOPSY;  Surgeon: Twylla Glendia BROCKS, MD;  Location: [facility];  Service: Urology;  Laterality: [name];   KNEE ARTHROSCOPY Right    x2   LAMINOTOMY  1986   L5/S1 lumbar laminotomy for two ruptured discs/fusion   LEFT HEART CATH AND CORS/GRAFTS ANGIOGRAPHY [name] 01/19/2024   Procedure: LEFT HEART CATH AND CORS/GRAFTS ANGIOGRAPHY;  Surgeon: Mady Bruckner, MD;  Location: [facility];  Service: Cardiovascular;  Laterality: [name];   LUMBAR LAMINECTOMY/DECOMPRESSION MICRODISCECTOMY [name] 07/13/2016   Procedure: LUMBAR TWO-THREE, LUMBAR THREE-FOUR, LUMBAR FOUR-FIVE LAMINECTOMY AND FORAMINOTOMY;  Surgeon: Reyes Budge, MD;  Location: [facility];  Service: Neurosurgery;  Laterality: [name];  LAMINECTOMY AND FORAMINOTOMY L2-L3, L3-L4,L4-L5   LUMBAR SPINE SURGERY  06/2020   Bilateral redo laminectomy/laminotomy/foraminotomies/medial facetectomy to decompress the bilateral L4 and L5 nerve roots Gavin)   REVERSE SHOULDER ARTHROPLASTY Right 12/21/2023   Procedure: ARTHROPLASTY, SHOULDER, TOTAL, REVERSE;  Surgeon: Melita Drivers, MD;  Location: [facility];  Service: Orthopedics;  Laterality: Right;   RIGHT/LEFT HEART CATH AND CORONARY ANGIOGRAPHY Bilateral 01/13/2023   Procedure: RIGHT/LEFT HEART CATH AND CORONARY ANGIOGRAPHY;  Surgeon: Darron Deatrice LABOR, MD;  Location: [facility];  Service: Cardiovascular;  Laterality: Bilateral;   TOTAL HIP ARTHROPLASTY Bilateral 1999,2000   TOTAL KNEE ARTHROPLASTY Right 03/06/2017   Procedure: RIGHT TOTAL KNEE ARTHROPLASTY;  Surgeon: Melodi Lerner, MD;  Location: [facility];  Service: Orthopedics;  Laterality: Right;   TRANSURETHRAL RESECTION OF BLADDER TUMOR  [name] 10/19/2021   Procedure: TRANSURETHRAL RESECTION OF BLADDER TUMOR (TURBT);  Surgeon: Twylla Glendia BROCKS, MD;  Location: [facility];  Service: Urology;  Laterality: [name];   TRANSURETHRAL RESECTION OF BLADDER TUMOR [name] 11/15/2022   Procedure: TRANSURETHRAL RESECTION OF BLADDER TUMOR (TURBT);  Surgeon: Twylla Glendia BROCKS, MD;  Location: [facility];  Service: Urology;  Laterality: [name];   Patient Active Problem List   Diagnosis Date Noted   HFrEF (heart failure with reduced ejection fraction) (HCC) 01/05/2024   Urinary incontinence 01/05/2024   Rotator cuff tear arthropathy of right shoulder 09/08/2023   Sinus headache 08/02/2023   Bradycardia 08/02/2023   Abnormal stress test 01/14/2023   Angina pectoris (HCC) 01/13/2023   Retinal artery occlusion, branch, 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   Cervical spondylosis with radiculopathy 07/11/2022   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   OSA (obstructive sleep apnea)    Chronic heart  failure with preserved ejection fraction (HFpEF) (HCC)    Nocturnal hypoxia 06/11/2021   CKD (chronic kidney disease) stage 3, GFR 30-59 ml/min (HCC) 02/24/2021   Peripheral neuropathy 02/23/2021   Spondylolisthesis of lumbar region 07/07/2020   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   Coccydynia 09/20/2019   Nasal sinus congestion 07/19/2019   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   Anxiety 06/04/2019   Chronic right shoulder pain 09/10/2018   Cervical stenosis of spinal canal 06/05/2017   Peripheral edema 06/05/2017   Health maintenance examination 12/06/2016   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/13/2016   Overweight (BMI 25.0-29.9) 07/04/2016   Low vitamin B12 level 01/01/2016   Exertional dyspnea 07/07/2015   Unstable angina (HCC)    Medicare annual wellness visit, subsequent 07/03/2015   Advanced care planning/counseling discussion 07/03/2015   Type 2 diabetes mellitus with other specified complication (HCC) 05/04/2015   Pleuritic chest pain 05/04/2015   Ex-smoker 05/04/2015   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   Spermatocele 04/02/2013   Syncopal vertigo 11/04/2010   Lumbar disc disease with radiculopathy 11/04/2010   Serous otitis media 06/07/2010   Coronary artery disease involving native coronary artery of native heart with unstable angina pectoris (HCC) 08/11/2009   PAD (peripheral artery disease) (HCC) 08/11/2009   DUPUYTREN'S CONTRACTURE, 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   Carotid stenosis 07/09/2008   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   Benign prostatic hyperplasia with urinary obstruction 09/06/2007   Essential hypertension 04/30/2007   GERD 04/30/2007   Hyperlipidemia associated with type 2 diabetes mellitus (HCC) 04/26/2007   OA (osteoarthritis) of knee 04/26/2007   DVT, HX OF 04/26/2007    PCP: Rilla Baller, MD REFERRING PROVIDER: Franky Pointer, MD (ortho)  REFERRING DIAG: s/p Rt rTSA  THERAPY DIAG:  Stiffness of right shoulder,  not elsewhere classified Rationale for Evaluation and Treatment: Rehabilitation ONSET DATE:   SUBJECTIVE:  SUBJECTIVE STATEMENT: Pt states that he is feeling sluggish from taking new blood pressure medication.  He has continued to do his shoulder exercises without much difficulty.   Hand dominance: right  PERTINENT HISTORY: Reverse TSA wiith Dr Melita 2nd week of May 2025. Pt wore sling x 2 weeks, then DC. Pt repors use of polar care, also says he's been moving his arm around a lot and its doing well. Pain controlled. Pt not aware of anti extension, anti IR precautions, but reports he would never do these anyway. Pt just DC from hospital 5 days prior to evaluation here after cardiac chest pain problem, underwent left radial cath, was on heaprin x3 days, now back home.  PAIN:  Are you having pain? No PRECAUTIONS: No shoulder extension, no IR+ADD shoulder right  WEIGHT BEARING RESTRICTIONS: No lifting, no body weight bearing  FALLS:  Has patient fallen in last 6 months? No PATIENT GOALS: improved arm function   NEXT MD VISIT: 2nd week June 2025  OBJECTIVE:  Note: Objective measures were completed at Evaluation unless otherwise noted.  PATIENT SURVEYS :  QUIcK DASH: 70.5% (01/25/24)   UPPER EXTREMITY ROM:    ROM Right eval Right  01/25/24 Right  02/20/24  Shoulder flexion 90 degrees  108 degrees 130 deg   Shoulder extension prohibited --   Shoulder abduction 45 degrees 78 degrees 115 deg    Shoulder internal rotation Full  --   Shoulder external rotation -20 degrees --   Elbow flexion Full  Full   Elbow extension Full  Full    (Blank rows = not tested)  PALPATION:  General anterior upper chest tightness and focal edema near surgical scar and pec major  TODAY'S TREATMENT   02/20/24: THEREX : All  single UE exercises on RUE  Shoulder Pulleys AAROM Flexion and Extension x 30   Shoulder Pulleys AAROM Abduction and Adduction x 30  OMEGA Seated Rows #25 2 x 10   Supine Shoulder Flex/Ext AAROM with#3 AW 1 x 10 with 5 sec hold at terminal flexion   Supine Shoulder Combine ER Stretch  2 x 60 sec   Supine Shoulder Flex/Ext AAROM with#3 AW 1 x 10 with 5 sec hold at terminal flexion   Supine Shoulder Flex/Ext with #2 lb 1 X 10 with 5 sec hold at terminal flexion   Shoulder AROM Flex R  130  Shoulder AROM Abd R  115   NMR  Seated Shoulder Flexion AROM to 90 deg 1 x 10  -Pt compensates with upper trap activation  Seated [place] with RUE 2 x 10  -mod VC to decrease upper trap activation   -with additional visual input to prevent shoulder elevation   PATIENT EDUCATION: Education details: See above  Person educated: Runner, broadcasting/film/video: collaborative learning, deliberate practice, positive reinforcement, explicit instruction, establish rules. Education comprehension: fair   HOME EXERCISE PROGRAM: Access Code: [email or web address] URL: [email or web address] Date: 02/20/2024 Prepared by: Toribio Servant  Program Notes Shoulder Slutes on right shoulder 2 x 10  3-4 days per week   Exercises - Seated Shoulder Flexion AAROM with Pulley Behind  - 1 x daily - 7 x weekly - 3 sets - 10 reps - Seated Shoulder Abduction AAROM with Pulley Behind  - 1 x daily - 7 x weekly - 3 sets - 10 reps - Seated Upper Trapezius Stretch  - 1 x daily - 7 x weekly - 3 reps - 60 sec  hold - Supine Chest Stretch with Elbows Bent  -  1 x daily - 7 x weekly - 3 reps - 60 sec hold - Seated Overhead Shoulder External Rotation Stretch with Towel  - 1 x daily - 7 x weekly - 3 reps - 30-60 sec hold - Standing 'L' Stretch at [employer]  - 5 x daily - 15 reps - 5sec hold - Sidelying Shoulder ER with Towel and Dumbbell  - 3-4 x weekly - 2 sets - 10 reps - Seated Shoulder Abduction AAROM with Dowel  - 3-4 x weekly - 3 sets -  10 reps - Supine Shoulder Flexion Extension Full Range AROM  - 3-4 x weekly - 3 sets - 10 reps  ASSESSMENT:  CLINICAL IMPRESSION: Pt is now 7.5  weeks post op right reverse total shoulder. He continues to progress towards rehab goals with an increase in right shoulder AROM. He does demonstrate decreased neuromuscular control and decreased strength with needed upper trap compensation to perform seated shoulder flexion.  PT used mod VC and TC along with visual input to have pt decrease upper trap activation to avoid using improper movement patterns. Pt was able to perform correctly but it did require significant cuing. Pt modified home exercise plan to include emphasis on NMR with increased sets, reps, and frequency to reinforce correct movement pattern with shoulder flexion. Pt will continue to benefit from skilled PT intervention to improve ROM and strength in RUE in order to restore to PLOF in ADL, IADL, and leisure activity.    OBJECTIVE IMPAIRMENTS: decreased activity tolerance, decreased knowledge of condition, decreased knowledge of use of DME, decreased mobility, decreased ROM, and decreased strength.  ACTIVITY LIMITATIONS: carrying, lifting, sleeping, bathing, toileting, dressing, and reach over head PARTICIPATION LIMITATIONS: meal prep, cleaning, laundry, medication management, interpersonal relationship, driving, shopping, and community activity PERSONAL FACTORS: Age, Behavior pattern, Education, Fitness, Past/current experiences, Profession, and Social background are also affecting patient's functional outcome.  REHAB POTENTIAL: Fair   CLINICAL DECISION MAKING: Medium  EVALUATION COMPLEXITY: Moderate  GOALS: Goals reviewed with patient? No  SHORT TERM GOALS: Target date: 02/22/24  Patient will report comprehension, confidence, and consistent compliance and of a simple home exercise program established to facilitate symptoms management and basic strengthening and/or segment mobility.    Baseline: issued at visit 1 and 2  Goal status: MET    2.  Patient to demonstrate improved score on self-report measure by >9% to indicate reduced self-reported disability and/or pain.    Baseline:  Quick DASH 70.5% Goal status: Progressing   LONG TERM GOALS: Target date: 03/19/24  Patient to improve score on self-report measure by 20% or greater to indicate reduced disability and improved quality of life.   Baseline: Quickash 70.5% Goal status: Progressing   2.  Patient to demonstrate improvement in strength testing by minimum of 1 grade to indicate improved motor performance.   Baseline: 3/5 or less all groups  Goal status: Progressing   3.  Patient to demonstrate improvement in RUE shoulder A/ROM without pain limitations >100 degrees flexion, 75 degrees ABDCT. Baseline: Shoulder Flex R/L 120/180, Shoulder Abd R/L 80/NT, Shoulder ER at 60 deg abd R/L 15/ NT  Goal status: Progressing   PLAN: PT FREQUENCY: 1-2x/week PT DURATION: 8 weeks PLANNED INTERVENTIONS: 97750- Physical Performance Testing, 97110-Therapeutic exercises, 97530- Therapeutic activity, [id number]- Neuromuscular re-education, 97535- Self Care, [id number]- Manual therapy, [id number]- Electrical stimulation (unattended), [id number]- Electrical stimulation (manual), Patient/Family education, Cryotherapy, and Moist heat  PLAN FOR NEXT SESSION: Assess right shoulder A/PROM including ER  and IR  at 90 deg  abduction and strength. Progress periscapular exercises: scapular squeezes with resistance, low row and inferior glide. Ball roll on wall. Side Lying Shoulder ER at 0 deg abduction. Banded front and lateral shoulder raises.    Toribio Servant PT, DPT  [facility] [address] [place], [address] Phone: [phone]   Fax:  [phone]

## 2024-02-22 ENCOUNTER — Ambulatory Visit: Payer: HMO

## 2024-02-22 ENCOUNTER — Ambulatory Visit: Admitting: Physical Therapy

## 2024-02-22 ENCOUNTER — Encounter: Payer: Self-pay | Admitting: Physical Therapy

## 2024-02-22 VITALS — Ht 71.75 in | Wt 208.0 lb

## 2024-02-22 DIAGNOSIS — Z0000 Encounter for general adult medical examination without abnormal findings: Secondary | ICD-10-CM | POA: Diagnosis not present

## 2024-02-22 DIAGNOSIS — M25611 Stiffness of right shoulder, not elsewhere classified: Secondary | ICD-10-CM

## 2024-02-22 NOTE — Patient Instructions (Signed)
 [name] , Thank you for taking time out of your busy schedule to complete your Annual Wellness Visit with me. I enjoyed our conversation and look forward to speaking with you again next year. I, as well as your care team,  appreciate your ongoing commitment to your health goals. Please review the following plan we discussed and let me know if I can assist you in the future. Your Game plan/ To Do List     Follow up Visits: Next Medicare AWV with our clinical staff: 02/24/25 @ 10:50 am televisit   Have you seen your provider in the last 6 months (3 months if uncontrolled diabetes)? Yes Next Office Visit with your provider: 03/29/24 Physical  Clinician Recommendations:  Aim for 30 minutes of exercise or brisk walking, 6-8 glasses of water , and 5 servings of fruits and vegetables each day.       This is a list of the screening recommended for you and due dates:  Health Maintenance  Topic Date Due   Yearly kidney health urinalysis for diabetes  Never done   COVID-19 Vaccine (5 - 2024-25 season) 04/16/2023   Eye exam for diabetics  09/15/2023   Medicare Annual Wellness Visit  02/21/2024   Flu Shot  03/15/2024   Hemoglobin A1C  06/13/2024   Complete foot exam   09/07/2024   Yearly kidney function blood test for diabetes  01/18/2025   DTaP/Tdap/Td vaccine (3 - Td or Tdap) 02/11/2028   Pneumococcal Vaccine for age over 67  Completed   Zoster (Shingles) Vaccine  Completed   Hepatitis B Vaccine  Aged Out   HPV Vaccine  Aged Out   Meningitis B Vaccine  Aged Out    Advanced directives: (In Chart) A copy of your advanced directives are scanned into your chart should your provider ever need it. Advance Care Planning is important because it:  [x]  Makes sure you receive the medical care that is consistent with your values, goals, and preferences  [x]  It provides guidance to your family and loved ones and reduces their decisional burden about whether or not they are making the right decisions  based on your wishes.  Follow the link provided in your after visit summary or read over the paperwork we have mailed to you to help you started getting your Advance Directives in place. If you need assistance in completing these, please reach out to us  so that we can help you!

## 2024-02-22 NOTE — Therapy (Signed)
 OUTPATIENT PHYSICAL THERAPY TREATMENT  Patient Name: [name] MRN: [id number] DOB:05-Dec-1940, 83 y.o., male Today's Date: 02/22/2024  END OF SESSION:  PT End of Session - 02/22/24 1347     Visit Number 6    Number of Visits 16    Date for PT Re-Evaluation 03/19/24    Authorization Type HTA    Authorization Time Period 01/23/24-03/19/24    Authorization - Visit Number 6    Authorization - Number of Visits 20    Progress Note Due on Visit 10    PT Start Time 1345    PT Stop Time 1430    PT Time Calculation (min) 45 min    Activity Tolerance Patient tolerated treatment well    Behavior During Therapy [facility], [facility] for tasks assessed/performed           Past Medical History:  Diagnosis Date   Actinic keratosis    Allergy    Anginal pain (HCC)    Anxiety    Aortic ectasia, abdominal (HCC)    a.) CT abd 01/01/2022: 2.9 cm infrarenal abdominal aorta   Aortic stenosis    Asthma    Bladder cancer (HCC) 2023   BPH (benign prostatic hyperplasia) 09/06/2007   CAD s/p CABG    a.) 1990 s/p MI--> CABG x 3; b.) LHC/PCI 2002 --> BMS (unk type) to LCx   Carotid arterial disease (HCC)    a.) 08/2016 Carotid U/S: <39% bilat; b.) carotid doppler 10/12/2022: mild-to-moderate (<50%) bilateral bifurcation plaque.   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 06/2019 Echo (in setting of COVID): EF 35-40%, mild LVH, g1 DD, glob HK; b. 09/2019 Echo: EF 50-55%, Gr1 DD; c. 03/2021 Echo: EF 50-55%, no rwma, mild LVH, GrI DD, nl RV fxn. RVSP 44.65mmHg. Mild-mod dil LA. Triv MR. Mild-mod Ao sclerosis.   History of 2019 novel coronavirus disease (COVID-19) 05/20/2019   History of hiatal hernia    History of shingles    HLD (hyperlipidemia) 04/26/2007   HTN (hypertension) 04/30/2007   LBBB (left bundle branch block)    Long-term use of aspirin  therapy    Lumbar disc disease with radiculopathy    Migraines    Myocardial infarction (HCC) 1990   a.) resulted in 3v CABG   Myocardial infarction (HCC) 2002   a.) second cardiac event; PCI with placement of BMS (unknown type) to LCx   Neuropathy of both feet    OSA on CPAP    Osteoarthritis    Oxygen  deficiency    PAD (peripheral artery disease) (HCC)    a. 07/2017 LE duplex: RSFA 75-78m, LSFA 75-53m, 50-74d; c. 08/2018 Periph Angio: No signif AoIliac dzs. Mod-sev Ca2+ RSFA w/ diff dzs throughout- 3 vessel runoff. Borderline signif LSFA dzs w/ mod-sev Ca2+ vessels and 3  vessel runoff below the knee-->Med rx.   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   Right mid dorsum forearm - in situ - EDC   Stroke (HCC)    T2DM (type 2 diabetes mellitus) (HCC)    Traumatic open wound of lower leg with delayed healing, left 12/28/2022   Past Surgical History:  Procedure Laterality Date   ABDOMINAL AORTOGRAM W/LOWER EXTREMITY [name] 09/12/2018   Procedure: ABDOMINAL AORTOGRAM W/LOWER EXTREMITY;  Surgeon: Darron Deatrice LABOR, MD;  Location: [facility];  Service: Cardiovascular;  Laterality: [name];    ANTERIOR CERVICAL DECOMP/DISCECTOMY FUSION [name] 07/11/2022   Anterior Cervical Decompression Fusion ,Interboy Prothesis,Plate/Screws , Cervical four-five,Cervical five-six Gavin, Reyes, MD)   APPENDECTOMY     rupture   BACK SURGERY     1984 and then [facility]   BLADDER INSTILLATION [name] 10/19/2021   Procedure: BLADDER INSTILLATION OF GEMCITABINE ;  Surgeon: Twylla Glendia BROCKS, MD;  Location: [facility];  Service: Urology;  Laterality: [name];   BLADDER INSTILLATION [name] 11/15/2022   Procedure: BLADDER INSTILLATION OF GEMCITABINE ;  Surgeon: Twylla Glendia BROCKS, MD;  Location: [facility];  Service: Urology;  Laterality: [name];   CARDIAC CATHETERIZATION [name] 07/07/2015   Procedure: Left Heart Cath and Cors/Grafts Angiography;  Surgeon: Evalene JINNY Lunger, MD;  Location: [facility];  Service: Cardiovascular;  Laterality: [name];   CATARACT EXTRACTION, BILATERAL  2009   CERVICAL SPINE SURGERY  12/2016   cervical stenosis Gavin)   COLONOSCOPY  03/2010   HP polyp, diverticulosis, rpt 10 yrs ([name])   CORONARY ANGIOPLASTY  11/30/2000   LCX   CORONARY ARTERY BYPASS GRAFT  1990   3 vessel    CORONARY STENT INTERVENTION [name] 01/13/2023   Procedure: CORONARY STENT INTERVENTION;  Surgeon: Darron Deatrice LABOR, MD;  Location: [facility];  Service: Cardiovascular;  Laterality: [name];   CYSTOSCOPY  12/23/2010   [facility]   CYSTOSCOPY WITH BIOPSY [name] 01/16/2024   Procedure: CYSTOSCOPY, WITH BIOPSY;  Surgeon: Twylla Glendia BROCKS, MD;  Location: [facility];  Service: Urology;  Laterality: [name];   EYE SURGERY     JOINT REPLACEMENT     KNEE ARTHROSCOPY Right    x2   LAMINOTOMY  1986   L5/S1 lumbar laminotomy for two ruptured discs/fusion   LEFT HEART CATH AND CORS/GRAFTS ANGIOGRAPHY [name] 01/19/2024   Procedure: LEFT HEART CATH AND CORS/GRAFTS ANGIOGRAPHY;  Surgeon: Mady Bruckner, MD;  Location: [facility];  Service: Cardiovascular;  Laterality: [name];   LUMBAR LAMINECTOMY/DECOMPRESSION  MICRODISCECTOMY [name] 07/13/2016   Procedure: LUMBAR TWO-THREE, LUMBAR THREE-FOUR, LUMBAR FOUR-FIVE LAMINECTOMY AND FORAMINOTOMY;  Surgeon: Reyes Budge, MD;  Location: [facility];  Service: Neurosurgery;  Laterality: [name];  LAMINECTOMY AND FORAMINOTOMY L2-L3, L3-L4,L4-L5   LUMBAR SPINE SURGERY  06/2020   Bilateral redo laminectomy/laminotomy/foraminotomies/medial facetectomy to decompress the bilateral L4 and L5 nerve roots Gavin)   REVERSE SHOULDER ARTHROPLASTY Right 12/21/2023   Procedure: ARTHROPLASTY, SHOULDER, TOTAL, REVERSE;  Surgeon: Melita Drivers, MD;  Location: [facility];  Service: Orthopedics;  Laterality: Right;   RIGHT/LEFT HEART CATH AND CORONARY ANGIOGRAPHY Bilateral 01/13/2023   Procedure: RIGHT/LEFT HEART CATH AND  CORONARY ANGIOGRAPHY;  Surgeon: Darron Deatrice LABOR, MD;  Location: [facility];  Service: Cardiovascular;  Laterality: Bilateral;   SPINE SURGERY     TOTAL HIP ARTHROPLASTY Bilateral 1999,2000   TOTAL KNEE ARTHROPLASTY Right 03/06/2017   Procedure: RIGHT TOTAL KNEE ARTHROPLASTY;  Surgeon: Melodi Lerner, MD;  Location: [facility];  Service: Orthopedics;  Laterality: Right;   TRANSURETHRAL RESECTION OF BLADDER TUMOR [name] 10/19/2021   Procedure: TRANSURETHRAL RESECTION OF BLADDER TUMOR (TURBT);  Surgeon: Twylla Glendia BROCKS, MD;  Location: [facility];  Service: Urology;  Laterality: [name];   TRANSURETHRAL RESECTION OF BLADDER TUMOR [name] 11/15/2022   Procedure: TRANSURETHRAL RESECTION OF BLADDER TUMOR (TURBT);  Surgeon: Twylla Glendia BROCKS, MD;  Location: [facility];  Service: Urology;  Laterality: [name];   Patient Active Problem List   Diagnosis Date Noted   HFrEF (heart failure with reduced ejection fraction) (HCC) 01/05/2024   Urinary incontinence 01/05/2024   Rotator cuff tear arthropathy of right shoulder 09/08/2023   Sinus headache 08/02/2023   Bradycardia 08/02/2023   Abnormal stress test 01/14/2023   Angina pectoris (HCC) 01/13/2023   Retinal artery occlusion, branch, left  10/04/2022   Cervical spondylosis with radiculopathy 07/11/2022   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   OSA (obstructive sleep apnea)    Chronic heart failure with preserved ejection fraction (HFpEF) (HCC)    Nocturnal hypoxia 06/11/2021   CKD (chronic kidney disease) stage 3, GFR 30-59 ml/min (HCC) 02/24/2021   Peripheral neuropathy 02/23/2021   Spondylolisthesis of lumbar region 07/07/2020   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   Coccydynia 09/20/2019   Nasal sinus congestion 07/19/2019   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   Anxiety 06/04/2019   Chronic right shoulder pain 09/10/2018   Cervical stenosis of spinal canal 06/05/2017   Peripheral edema 06/05/2017   Health maintenance examination 12/06/2016   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/13/2016   Overweight (BMI 25.0-29.9) 07/04/2016   Low vitamin B12 level 01/01/2016   Exertional dyspnea 07/07/2015   Unstable angina (HCC)    Medicare annual wellness visit, subsequent 07/03/2015   Advanced care planning/counseling discussion 07/03/2015   Type 2 diabetes mellitus with other specified complication (HCC) 05/04/2015   Pleuritic chest pain 05/04/2015   Ex-smoker 05/04/2015   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   Spermatocele 04/02/2013   Syncopal vertigo 11/04/2010   Lumbar disc disease with radiculopathy 11/04/2010   Serous otitis media 06/07/2010   Coronary artery disease involving native coronary artery of native heart with unstable angina pectoris (HCC) 08/11/2009   PAD (peripheral artery disease) (HCC) 08/11/2009   DUPUYTREN'S CONTRACTURE, 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   Carotid stenosis 07/09/2008   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   Benign prostatic hyperplasia with urinary obstruction 09/06/2007   Essential hypertension 04/30/2007   GERD 04/30/2007   Hyperlipidemia associated with type 2 diabetes mellitus (HCC) 04/26/2007   OA (osteoarthritis) of knee 04/26/2007   DVT, HX  OF 04/26/2007    PCP: Rilla Baller, MD REFERRING PROVIDER: Franky Pointer, MD (ortho)  REFERRING DIAG: s/p Rt rTSA  THERAPY DIAG:  Stiffness of right shoulder, not elsewhere classified Rationale for Evaluation and Treatment: Rehabilitation ONSET DATE:   SUBJECTIVE:  SUBJECTIVE STATEMENT: Pt reports ongoing stiffness in right shoulder but no stiffness.   Hand dominance: right  PERTINENT HISTORY: Reverse TSA wiith Dr Melita 2nd week of May 2025. Pt wore sling x 2 weeks, then DC. Pt repors use of polar care, also says he's been moving his arm around a lot and its doing well. Pain controlled. Pt not aware of anti extension, anti IR precautions, but reports he would never do these anyway. Pt just DC from hospital 5 days prior to evaluation here after cardiac chest pain problem, underwent left radial cath, was on heaprin x3 days, now back home.  PAIN:  Are you having pain? No PRECAUTIONS: No shoulder extension, no IR+ADD shoulder right  WEIGHT BEARING RESTRICTIONS: No lifting, no body weight bearing  FALLS:  Has patient fallen in last 6 months? No PATIENT GOALS: improved arm function   NEXT MD VISIT: 2nd week June 2025  OBJECTIVE:  Note: Objective measures were completed at Evaluation unless otherwise noted.  PATIENT SURVEYS :  QUIcK DASH: 70.5% (01/25/24)   UPPER EXTREMITY ROM:    A/PROM Right eval Right  01/25/24 Right  02/20/24 Right   7/10 Left  7/10   Shoulder flexion 90 degrees  108 degrees 130 deg  150 deg 160 deg    Shoulder extension prohibited --     Shoulder abduction 45 degrees 78 degrees 115 deg   95/110   120 deg   Shoulder internal rotation at 90 deg abd  Full  --  70 deg    Shoulder external rotation at 90 deg abd -20 degrees --  30/35   Shoulder ER at 0 deg     40 50   Shoulder IR  at 0 deg     NT NT   Elbow flexion Full  Full     Elbow extension Full  Full      (Blank rows = not tested)                                          PALPATION:  General anterior upper chest tightness and focal edema near surgical scar and pec major  TODAY'S TREATMENT   02/22/24: THEREX: All single UE exercises on RUE   UBE Seat at 7- 3 min forward and 3 min backward   See Right Shoulder AROM and PROM measurements listed above.  Standing Shoulder Abduction AAROM with stick 3 x 10  Seated Shoulder ER AAROM with stick 3 x 10   Shoulder Flexion AROM 1 X 10   -Pt continues to shrug shoulder but about same as left shoulder   Shoulder Flexion AAROM 1 X 10  with stick   Bent Over Shoulder Flexion Flexion Stretch 3 x 60 sec  Standing Shoulder Stretch 3 x 60 sec   Shoulder ER at 0 deg abd with #1 DB 1 x 10   -Pt lacks ROM to perform strengthening exercises  Shoulder ER at 0 deg abd AAROM 3 x 10   PATIENT EDUCATION: Education details: See above  Person educated: Runner, broadcasting/film/video: collaborative learning, deliberate practice, positive reinforcement, explicit instruction, establish rules. Education comprehension: fair   HOME EXERCISE PROGRAM: Access Code: [email or web address] URL: [email or web address] Date: 02/20/2024 Prepared by: Toribio Servant  Program Notes Shoulder Slutes on right shoulder 2 x 10  3-4 days per week   Exercises - Seated Shoulder Flexion AAROM with Pulley Behind  -  1 x daily - 7 x weekly - 3 sets - 10 reps - Seated Shoulder Abduction AAROM with Pulley Behind  - 1 x daily - 7 x weekly - 3 sets - 10 reps - Seated Upper Trapezius Stretch  - 1 x daily - 7 x weekly - 3 reps - 60 sec  hold - Supine Chest Stretch with Elbows Bent  - 1 x daily - 7 x weekly - 3 reps - 60 sec hold - Seated Overhead Shoulder External Rotation Stretch with Towel  - 1 x daily - 7 x weekly - 3 reps - 30-60 sec hold - Standing 'L' Stretch at Counter  - 5 x daily - 15 reps - 5sec  hold - Sidelying Shoulder ER with Towel and Dumbbell  - 3-4 x weekly - 2 sets - 10 reps - Seated Shoulder Abduction AAROM with Dowel  - 3-4 x weekly - 3 sets - 10 reps - Supine Shoulder Flexion Extension Full Range AROM  - 3-4 x weekly - 3 sets - 10 reps  ASSESSMENT:  CLINICAL IMPRESSION: Pt is now 8  weeks post op right reverse total shoulder. He shows improvement in shoulder flexion but he still has decreased motion with shoulder abduction ans external rotation. His progress is well within the parameters of phase III or intermediate phase of protocol with ongoing improvement in AROM and strength. He does continue to compensate with shoulder flexion slightly with upper trap activation and he will benefit from NMR. He will continue to benefit from skilled PT intervention to improve ROM and strength in RUE in order to restore to PLOF in ADL, IADL, and leisure activity.    OBJECTIVE IMPAIRMENTS: decreased activity tolerance, decreased knowledge of condition, decreased knowledge of use of DME, decreased mobility, decreased ROM, and decreased strength.  ACTIVITY LIMITATIONS: carrying, lifting, sleeping, bathing, toileting, dressing, and reach over head PARTICIPATION LIMITATIONS: meal prep, cleaning, laundry, medication management, interpersonal relationship, driving, shopping, and community activity PERSONAL FACTORS: Age, Behavior pattern, Education, Fitness, Past/current experiences, Profession, and Social background are also affecting patient's functional outcome.  REHAB POTENTIAL: Fair   CLINICAL DECISION MAKING: Medium  EVALUATION COMPLEXITY: Moderate  GOALS: Goals reviewed with patient? No  SHORT TERM GOALS: Target date: 02/22/24  Patient will report comprehension, confidence, and consistent compliance and of a simple home exercise program established to facilitate symptoms management and basic strengthening and/or segment mobility.   Baseline: issued at visit 1 and 2  Goal status: MET     2.  Patient to demonstrate improved score on self-report measure by >9% to indicate reduced self-reported disability and/or pain.    Baseline:  Quick DASH 70.5% Goal status: Progressing   LONG TERM GOALS: Target date: 03/19/24  Patient to improve score on self-report measure by 20% or greater to indicate reduced disability and improved quality of life.   Baseline: Quickash 70.5% Goal status: Progressing   2.  Patient to demonstrate improvement in strength testing by minimum of 1 grade to indicate improved motor performance.   Baseline: 3/5 or less all groups  Goal status: Progressing   3.  Patient to demonstrate improvement in RUE shoulder A/ROM without pain limitations >100 degrees flexion, 75 degrees ABDCT. Baseline: Shoulder Flex R/L 120/180, Shoulder Abd R/L 80/NT, Shoulder ER at 60 deg abd R/L 15/ NT 02/22/24 Shoulder Flex 160, Abd 95  Goal status: ACHIEVED   PLAN: PT FREQUENCY: 1-2x/week PT DURATION: 8 weeks PLANNED INTERVENTIONS: 97750- Physical Performance Testing, 97110-Therapeutic exercises, 97530- Therapeutic activity, [id number]- Neuromuscular  re-education, [phone]- Self Care, 02859- Manual therapy, [id number]- Electrical stimulation (unattended), [phone]- Electrical stimulation (manual), Patient/Family education, Cryotherapy, and Moist heat  PLAN FOR NEXT SESSION: Take QuickDash Assess right shoulder strength and ROM. Progress periscapular exercises: scapular squeezes with resistance, low row and inferior glide. Ball roll on wall. Side Lying Shoulder ER at 0 deg abduction.   Toribio Servant PT, DPT  [facility] [address] [place], [address] Phone: [phone]   Fax:  [phone]
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
## 2024-02-23 ENCOUNTER — Other Ambulatory Visit: Payer: Self-pay

## 2024-02-27 ENCOUNTER — Ambulatory Visit: Admitting: Physical Therapy

## 2024-02-27 DIAGNOSIS — M25611 Stiffness of right shoulder, not elsewhere classified: Secondary | ICD-10-CM

## 2024-02-27 NOTE — Therapy (Signed)
 OUTPATIENT PHYSICAL THERAPY TREATMENT  Patient Name: [name] MRN: [id number] DOB:1940-12-11, 83 y.o., male Today's Date: 02/27/2024  END OF SESSION:  PT End of Session - 02/27/24 1309     Visit Number 7    Number of Visits 16    Date for PT Re-Evaluation 03/19/24    Authorization Type HTA    Authorization Time Period 01/23/24-03/19/24    Authorization - Visit Number 7    Authorization - Number of Visits 20    Progress Note Due on Visit 10    PT Start Time 1303    PT Stop Time 1345    PT Time Calculation (min) 42 min    Activity Tolerance Patient tolerated treatment well    Behavior During Therapy [facility] for tasks assessed/performed           Past Medical History:  Diagnosis Date   Actinic keratosis    Allergy    Anginal pain (HCC)    Anxiety    Aortic ectasia, abdominal (HCC)    a.) CT abd 01/01/2022: 2.9 cm infrarenal abdominal aorta   Aortic stenosis    Asthma    Bladder cancer (HCC) 2023   BPH (benign prostatic hyperplasia) 09/06/2007   CAD s/p CABG    a.) 1990 s/p MI--> CABG x 3; b.) LHC/PCI 2002 --> BMS (unk type) to LCx   Carotid arterial disease (HCC)    a.) 08/2016 Carotid U/S: <39% bilat; b.) carotid doppler 10/12/2022: mild-to-moderate (<50%) bilateral bifurcation plaque.   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 06/2019 Echo (in setting of COVID): EF 35-40%, mild LVH, g1 DD, glob HK; b. 09/2019 Echo: EF 50-55%, Gr1 DD; c. 03/2021 Echo: EF 50-55%, no rwma, mild LVH, GrI DD, nl RV fxn. RVSP 44.47mmHg. Mild-mod dil LA. Triv MR. Mild-mod Ao sclerosis.   History of 2019 novel coronavirus disease (COVID-19) 05/20/2019   History of hiatal hernia    History of shingles    HLD (hyperlipidemia) 04/26/2007   HTN (hypertension) 04/30/2007   LBBB (left bundle branch block)    Long-term use of aspirin  therapy    Lumbar disc disease with radiculopathy    Migraines    Myocardial infarction (HCC) 1990   a.) resulted in 3v CABG   Myocardial infarction (HCC) 2002   a.) second cardiac event; PCI with placement of BMS (unknown type) to LCx   Neuropathy of both feet    OSA on CPAP    Osteoarthritis    Oxygen  deficiency    PAD (peripheral artery disease) (HCC)    a. 07/2017 LE duplex: RSFA 75-15m, LSFA 75-18m, 50-74d; c. 08/2018 Periph Angio: No signif AoIliac dzs. Mod-sev Ca2+ RSFA w/ diff dzs throughout- 3 vessel runoff. Borderline signif LSFA dzs w/ mod-sev Ca2+ vessels and 3  vessel runoff below the knee-->Med rx.   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   Right mid dorsum forearm - in situ - EDC   Stroke (HCC)    T2DM (type 2 diabetes mellitus) (HCC)    Traumatic open wound of lower leg with delayed healing, left 12/28/2022   Past Surgical History:  Procedure Laterality Date   ABDOMINAL AORTOGRAM W/LOWER EXTREMITY [name] 09/12/2018   Procedure: ABDOMINAL AORTOGRAM W/LOWER EXTREMITY;  Surgeon: Darron Deatrice LABOR, MD;  [facility]: [facility];  Service: Cardiovascular;  Laterality: [name];    ANTERIOR CERVICAL DECOMP/DISCECTOMY FUSION [name] 07/11/2022   Anterior Cervical Decompression Fusion ,Interboy Prothesis,Plate/Screws , Cervical four-five,Cervical five-six Gavin, Reyes, MD)   APPENDECTOMY     rupture   BACK SURGERY     1984 and then another one [facility]   BLADDER INSTILLATION [name] 10/19/2021   Procedure: BLADDER INSTILLATION OF GEMCITABINE ;  Surgeon: Twylla Glendia BROCKS, MD;  Location: [facility];  Service: Urology;  Laterality: [name];   BLADDER INSTILLATION [name] 11/15/2022   Procedure: BLADDER INSTILLATION OF GEMCITABINE ;  Surgeon: Twylla Glendia BROCKS, MD;  Location: [facility];  Service: Urology;  Laterality: [name];   CARDIAC CATHETERIZATION [name] 07/07/2015   Procedure: Left Heart Cath and Cors/Grafts Angiography;  Surgeon: Evalene JINNY Lunger, MD;  Location: [facility];  Service: Cardiovascular;  Laterality: [name];   CATARACT EXTRACTION, BILATERAL  2009   CERVICAL SPINE SURGERY  12/2016   cervical stenosis Gavin)   COLONOSCOPY  03/2010   HP polyp, diverticulosis, rpt 10 yrs ([name])   CORONARY ANGIOPLASTY  11/30/2000   LCX   CORONARY ARTERY BYPASS GRAFT  1990   3 vessel    CORONARY STENT INTERVENTION [name] 01/13/2023   Procedure: CORONARY STENT INTERVENTION;  Surgeon: Darron Deatrice LABOR, MD;  Location: [facility];  Service: Cardiovascular;  Laterality: [name];   CYSTOSCOPY  12/23/2010   [facility]   CYSTOSCOPY WITH BIOPSY [name] 01/16/2024   Procedure: CYSTOSCOPY, WITH BIOPSY;  Surgeon: Twylla Glendia BROCKS, MD;  Location: [facility];  Service: Urology;  Laterality: [name];   EYE SURGERY     JOINT REPLACEMENT     KNEE ARTHROSCOPY Right    x2   LAMINOTOMY  1986   L5/S1 lumbar laminotomy for two ruptured discs/fusion   LEFT HEART CATH AND CORS/GRAFTS ANGIOGRAPHY [name] 01/19/2024   Procedure: LEFT HEART CATH AND CORS/GRAFTS ANGIOGRAPHY;  Surgeon: Mady Bruckner, MD;  Location: [facility];  Service: Cardiovascular;  Laterality: [name];   LUMBAR LAMINECTOMY/DECOMPRESSION  MICRODISCECTOMY [name] 07/13/2016   Procedure: LUMBAR TWO-THREE, LUMBAR THREE-FOUR, LUMBAR FOUR-FIVE LAMINECTOMY AND FORAMINOTOMY;  Surgeon: Reyes Budge, MD;  Location: [facility];  Service: Neurosurgery;  Laterality: [name];  LAMINECTOMY AND FORAMINOTOMY L2-L3, L3-L4,L4-L5   LUMBAR SPINE SURGERY  06/2020   Bilateral redo laminectomy/laminotomy/foraminotomies/medial facetectomy to decompress the bilateral L4 and L5 nerve roots Gavin)   REVERSE SHOULDER ARTHROPLASTY Right 12/21/2023   Procedure: ARTHROPLASTY, SHOULDER, TOTAL, REVERSE;  Surgeon: Melita Drivers, MD;  Location: [facility];  Service: Orthopedics;  Laterality: Right;   RIGHT/LEFT HEART CATH AND CORONARY ANGIOGRAPHY Bilateral 01/13/2023   Procedure: RIGHT/LEFT HEART CATH AND  CORONARY ANGIOGRAPHY;  Surgeon: Darron Deatrice LABOR, MD;  Location: [facility];  Service: Cardiovascular;  Laterality: Bilateral;   SPINE SURGERY     TOTAL HIP ARTHROPLASTY Bilateral 1999,2000   TOTAL KNEE ARTHROPLASTY Right 03/06/2017   Procedure: RIGHT TOTAL KNEE ARTHROPLASTY;  Surgeon: Melodi Lerner, MD;  Location: [facility];  Service: Orthopedics;  Laterality: Right;   TRANSURETHRAL RESECTION OF BLADDER TUMOR [name] 10/19/2021   Procedure: TRANSURETHRAL RESECTION OF BLADDER TUMOR (TURBT);  Surgeon: Twylla Glendia BROCKS, MD;  Location: [facility];  Service: Urology;  Laterality: [name];   TRANSURETHRAL RESECTION OF BLADDER TUMOR [name] 11/15/2022   Procedure: TRANSURETHRAL RESECTION OF BLADDER TUMOR (TURBT);  Surgeon: Twylla Glendia BROCKS, MD;  Location: [facility];  Service: Urology;  Laterality: [name];   Patient Active Problem List   Diagnosis Date Noted   HFrEF (heart failure with reduced ejection fraction) (HCC) 01/05/2024   Urinary incontinence 01/05/2024   Rotator cuff tear arthropathy of right shoulder 09/08/2023   Sinus headache 08/02/2023   Bradycardia 08/02/2023   Abnormal stress test 01/14/2023   Angina pectoris (HCC) 01/13/2023   Retinal artery occlusion, branch, left  10/04/2022   Cervical spondylosis with radiculopathy 07/11/2022   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   OSA (obstructive sleep apnea)    Chronic heart failure with preserved ejection fraction (HFpEF) (HCC)    Nocturnal hypoxia 06/11/2021   CKD (chronic kidney disease) stage 3, GFR 30-59 ml/min (HCC) 02/24/2021   Peripheral neuropathy 02/23/2021   Spondylolisthesis of lumbar region 07/07/2020   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   Coccydynia 09/20/2019   Nasal sinus congestion 07/19/2019   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   Anxiety 06/04/2019   Chronic right shoulder pain 09/10/2018   Cervical stenosis of spinal canal 06/05/2017   Peripheral edema 06/05/2017   Health maintenance examination 12/06/2016   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/13/2016   Overweight (BMI 25.0-29.9) 07/04/2016   Low vitamin B12 level 01/01/2016   Exertional dyspnea 07/07/2015   Unstable angina (HCC)    Medicare annual wellness visit, subsequent 07/03/2015   Advanced care planning/counseling discussion 07/03/2015   Type 2 diabetes mellitus with other specified complication (HCC) 05/04/2015   Pleuritic chest pain 05/04/2015   Ex-smoker 05/04/2015   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   Spermatocele 04/02/2013   Syncopal vertigo 11/04/2010   Lumbar disc disease with radiculopathy 11/04/2010   Serous otitis media 06/07/2010   Coronary artery disease involving native coronary artery of native heart with unstable angina pectoris (HCC) 08/11/2009   PAD (peripheral artery disease) (HCC) 08/11/2009   DUPUYTREN'S CONTRACTURE, 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   Carotid stenosis 07/09/2008   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   Benign prostatic hyperplasia with urinary obstruction 09/06/2007   Essential hypertension 04/30/2007   GERD 04/30/2007   Hyperlipidemia associated with type 2 diabetes mellitus (HCC) 04/26/2007   OA (osteoarthritis) of knee 04/26/2007   DVT, HX  OF 04/26/2007    PCP: Rilla Baller, MD REFERRING PROVIDER: Franky Pointer, MD (ortho)  REFERRING DIAG: s/p Rt rTSA  THERAPY DIAG:  Stiffness of right shoulder, not elsewhere classified Rationale for Evaluation and Treatment: Rehabilitation ONSET DATE:   SUBJECTIVE:  SUBJECTIVE STATEMENT: Pt reports having a busy weekend at beach, and he did not have much time to do his exercises as a result.    Hand dominance: right  PERTINENT HISTORY: Reverse TSA wiith Dr Melita 2nd week of May 2025. Pt wore sling x 2 weeks, then DC. Pt repors use of polar care, also says he's been moving his arm around a lot and its doing well. Pain controlled. Pt not aware of anti extension, anti IR precautions, but reports he would never do these anyway. Pt just DC from hospital 5 days prior to evaluation here after cardiac chest pain problem, underwent left radial cath, was on heaprin x3 days, now back home.  PAIN:  Are you having pain? No PRECAUTIONS: No shoulder extension, no IR+ADD shoulder right  WEIGHT BEARING RESTRICTIONS: No lifting, no body weight bearing  FALLS:  Has patient fallen in last 6 months? No PATIENT GOALS: improved arm function   NEXT MD VISIT: 2nd week June 2025  OBJECTIVE:  Note: Objective measures were completed at Evaluation unless otherwise noted.  PATIENT SURVEYS :  QUIcK DASH: 70.5% (01/25/24)   UPPER EXTREMITY ROM:    A/PROM Right eval Right  01/25/24 Right  02/20/24 Right   7/10 Left  7/10  Right  02/27/24  Shoulder flexion 90 degrees  108 degrees 130 deg  150 deg 160 deg   130 deg   Shoulder extension prohibited --      Shoulder abduction 45 degrees 78 degrees 115 deg   95/110   120 deg  115 deg    Shoulder internal rotation at 90 deg abd  Full  --  70 deg   60 deg  Shoulder external rotation  at 90 deg abd -20 degrees --  30/35  18 deg  Shoulder ER at 0 deg     40 50  30   Shoulder IR at 0 deg     NT NT    Elbow flexion Full  Full      Elbow extension Full  Full       (Blank rows = not tested)                                          PALPATION:  General anterior upper chest tightness and focal edema near surgical scar and pec major  TODAY'S TREATMENT   02/27/24: THEREX: All single UE exercises on RUE   Shoulder AAROM Flex/Ext #3 AW x 30  Right Shoulder ROM  (See Objective Measurements)  Shoulder AAROM ER/IR  at 90 deg Abd x 30   Shoulder AAROM ER/IR at 0 deg Abd x 30  -Able to reach 20 deg ER  Sky Punches at 90 deg 3 x 10   -mod VC to maintain extended elbow Shoulder Abd/Add AAROM with wand  2 x 10   Shoulder Abduction Wall Walks  x 5  -Pt reports increased fatigue   Shoulder flexion wall walks x 5    QuickDash= 48%    PATIENT EDUCATION: Education details: See above  Person educated: Runner, broadcasting/film/video: collaborative learning, deliberate practice, positive reinforcement, explicit instruction, establish rules. Education comprehension: fair   HOME EXERCISE PROGRAM: Access Code: [email or web address] URL: [email or web address] Date: 02/27/2024 Prepared by: Toribio Servant  Program Notes Shoulder Slutes on right shoulder 2 x 10  3-4 days per week   Exercises - Seated Shoulder Flexion AAROM with Pulley  Behind  - 1 x daily - 7 x weekly - 3 sets - 10 reps - Seated Shoulder Abduction AAROM with Pulley Behind  - 1 x daily - 7 x weekly - 3 sets - 10 reps - Seated Upper Trapezius Stretch  - 1 x daily - 7 x weekly - 3 reps - 60 sec  hold - Supine Chest Stretch with Elbows Bent  - 1 x daily - 7 x weekly - 3 reps - 60 sec hold - Seated Overhead Shoulder External Rotation Stretch with Towel  - 1 x daily - 7 x weekly - 3 reps - 30-60 sec hold - Standing 'L' Stretch at [employer]  - 5 x daily - 15 reps - 5sec hold - Seated AAROM Shoulder External Rotation at 90  Degrees with Counter Support Using Cane  - 1 x daily - 7 x weekly - 3 sets - 10 reps - Standing Shoulder External Rotation AAROM with Dowel  - 1 x daily - 7 x weekly - 3 sets - 10 reps - Standing Shoulder Flexion Wall Walk  - 1 x daily - 7 x weekly - 2 sets - 10 reps - Standing Shoulder Abduction Wall Slide   - 1 x daily - 7 x weekly - 2 sets - 10 reps  ASSESSMENT:  CLINICAL IMPRESSION: Pt is now 8.5  weeks post op right reverse total shoulder. Pt exhibits decreased right shoulder ROM at this visit. However, this is likely from ongoing stiffness from having not completed over the weekend and likely not moving the shoulder as much due to driving. He also shows decreased perception of right shoulder function as a result of increased QuickDash score. Pt is slightly behind ROM goals for this phase of rehab and this will be the focus of ongoing sessions. He will continue to benefit from skilled PT intervention to improve ROM and strength in RUE in order to restore to PLOF in ADL, IADL, and leisure activity.     OBJECTIVE IMPAIRMENTS: decreased activity tolerance, decreased knowledge of condition, decreased knowledge of use of DME, decreased mobility, decreased ROM, and decreased strength.  ACTIVITY LIMITATIONS: carrying, lifting, sleeping, bathing, toileting, dressing, and reach over head PARTICIPATION LIMITATIONS: meal prep, cleaning, laundry, medication management, interpersonal relationship, driving, shopping, and community activity PERSONAL FACTORS: Age, Behavior pattern, Education, Fitness, Past/current experiences, Profession, and Social background are also affecting patient's functional outcome.  REHAB POTENTIAL: Fair   CLINICAL DECISION MAKING: Medium  EVALUATION COMPLEXITY: Moderate  GOALS: Goals reviewed with patient? No  SHORT TERM GOALS: Target date: 02/22/24  Patient will report comprehension, confidence, and consistent compliance and of a simple home exercise program established to  facilitate symptoms management and basic strengthening and/or segment mobility.   Baseline: issued at visit 1 and 2  Goal status: MET    2.  Patient to demonstrate improved score on self-report measure by >9% to indicate reduced self-reported disability and/or pain.    Baseline:  Quick DASH 70.5%  02/27/24: 48%  Goal status: NOT MET    LONG TERM GOALS: Target date: 03/19/24  Patient to improve score on self-report measure by 20% or greater to indicate reduced disability and improved quality of life.   Baseline: Bertrand 70.5% 7/115/25: 48% Goal status: Progressing   2.  Patient to demonstrate improvement in strength testing by minimum of 1 grade to indicate improved motor performance.   Baseline: 3/5 or less all groups  Goal status: Progressing   3.  Patient to demonstrate improvement in RUE  shoulder A/ROM without pain limitations >100 degrees flexion, 75 degrees ABDCT. Baseline: Shoulder Flex R/L 120/180, Shoulder Abd R/L 80/NT, Shoulder ER at 60 deg abd R/L 15/ NT 02/22/24 Shoulder Flex 160, Abd 95  Goal status: ACHIEVED   PLAN: PT FREQUENCY: 1-2x/week PT DURATION: 8 weeks PLANNED INTERVENTIONS: 97750- Physical Performance Testing, 97110-Therapeutic exercises, 97530- Therapeutic activity, [id number]- Neuromuscular re-education, 97535- Self Care, [id number]- Manual therapy, [id number]- Electrical stimulation (unattended), 02967- Electrical stimulation (manual), Patient/Family education, Cryotherapy, and Moist heat  PLAN FOR NEXT SESSION: Reassess goals. Wall walks shoulder abduction and flexion. Progress periscapular exercises: seated rows. Seated shoulder flexion and abduction arom add resistance to flexion.    Toribio Servant PT, DPT  [facility] [address], [place], [address] Phone: [phone]   Fax:  [phone]

## 2024-02-29 ENCOUNTER — Ambulatory Visit: Admitting: Physical Therapy

## 2024-02-29 DIAGNOSIS — M25611 Stiffness of right shoulder, not elsewhere classified: Secondary | ICD-10-CM | POA: Diagnosis not present

## 2024-02-29 DIAGNOSIS — H903 Sensorineural hearing loss, bilateral: Secondary | ICD-10-CM | POA: Diagnosis not present

## 2024-02-29 DIAGNOSIS — H6123 Impacted cerumen, bilateral: Secondary | ICD-10-CM | POA: Diagnosis not present

## 2024-02-29 NOTE — Therapy (Signed)
 OUTPATIENT PHYSICAL THERAPY TREATMENT  Patient Name: [name] MRN: [id number] DOB:Nov 06, 1940, 83 y.o., male Today's Date: 02/29/2024  END OF SESSION:  PT End of Session - 02/29/24 1308     Visit Number 8    Number of Visits 16    Date for PT Re-Evaluation 03/19/24    Authorization Type HTA    Authorization Time Period 01/23/24-03/19/24    Authorization - Visit Number 8    Authorization - Number of Visits 20    Progress Note Due on Visit 10    PT Start Time 1300    PT Stop Time 1345    PT Time Calculation (min) 45 min    Activity Tolerance Patient tolerated treatment well    Behavior During Therapy [facility] for tasks assessed/performed            Past Medical History:  Diagnosis Date   Actinic keratosis    Allergy    Anginal pain (HCC)    Anxiety    Aortic ectasia, abdominal (HCC)    a.) CT abd 01/01/2022: 2.9 cm infrarenal abdominal aorta   Aortic stenosis    Asthma    Bladder cancer (HCC) 2023   BPH (benign prostatic hyperplasia) 09/06/2007   CAD s/p CABG    a.) 1990 s/p MI--> CABG x 3; b.) LHC/PCI 2002 --> BMS (unk type) to LCx   Carotid arterial disease (HCC)    a.) 08/2016 Carotid U/S: <39% bilat; b.) carotid doppler 10/12/2022: mild-to-moderate (<50%) bilateral bifurcation plaque.   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 06/2019 Echo (in setting of COVID): EF 35-40%, mild LVH, g1 DD, glob HK; b. 09/2019 Echo: EF 50-55%, Gr1 DD; c. 03/2021 Echo: EF 50-55%, no rwma, mild LVH, GrI DD, nl RV fxn. RVSP 44.65mmHg. Mild-mod dil LA. Triv MR. Mild-mod Ao sclerosis.   History of 2019 novel coronavirus disease (COVID-19) 05/20/2019   History of hiatal hernia    History of shingles    HLD (hyperlipidemia) 04/26/2007   HTN (hypertension) 04/30/2007   LBBB (left bundle branch block)    Long-term use of aspirin  therapy    Lumbar disc disease with radiculopathy    Migraines    Myocardial infarction (HCC) 1990   a.) resulted in 3v CABG   Myocardial infarction (HCC) 2002   a.) second cardiac event; PCI with placement of BMS (unknown type) to LCx   Neuropathy of both feet    OSA on CPAP    Osteoarthritis    Oxygen  deficiency    PAD (peripheral artery disease) (HCC)    a. 07/2017 LE duplex: RSFA 75-65m, LSFA 75-56m, 50-74d; c. 08/2018 Periph Angio: No signif AoIliac dzs. Mod-sev Ca2+ RSFA w/ diff dzs throughout- 3 vessel runoff. Borderline signif LSFA dzs w/ mod-sev Ca2+ vessels and  3 vessel runoff below the knee-->Med rx.   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   Right mid dorsum forearm - in situ - EDC   Stroke (HCC)    T2DM (type 2 diabetes mellitus) (HCC)    Traumatic open wound of lower leg with delayed healing, left 12/28/2022   Past Surgical History:  Procedure Laterality Date   ABDOMINAL AORTOGRAM W/LOWER EXTREMITY [name] 09/12/2018   Procedure: ABDOMINAL AORTOGRAM W/LOWER EXTREMITY;  Surgeon: Darron Deatrice LABOR, MD;  Location: [facility];  Service: Cardiovascular;  Laterality:  [name];   ANTERIOR CERVICAL DECOMP/DISCECTOMY FUSION [name] 07/11/2022   Anterior Cervical Decompression Fusion ,Interboy Prothesis,Plate/Screws , Cervical four-five,Cervical five-six Gavin, Reyes, MD)   APPENDECTOMY     rupture   BACK SURGERY     1984 and then another one [facility]   BLADDER INSTILLATION [name] 10/19/2021   Procedure: BLADDER INSTILLATION OF GEMCITABINE ;  Surgeon: Twylla Glendia BROCKS, MD;  Location: [facility];  Service: Urology;  Laterality: [name];   BLADDER INSTILLATION [name] 11/15/2022   Procedure: BLADDER INSTILLATION OF GEMCITABINE ;  Surgeon: Twylla Glendia BROCKS, MD;  Location: [facility];  Service: Urology;  Laterality: [name];   CARDIAC CATHETERIZATION [name] 07/07/2015   Procedure: Left Heart Cath and Cors/Grafts Angiography;  Surgeon: Evalene JINNY Lunger, MD;  Location: [facility];  Service: Cardiovascular;  Laterality: [name];   CATARACT EXTRACTION, BILATERAL  2009   CERVICAL SPINE SURGERY  12/2016   cervical stenosis Gavin)   COLONOSCOPY  03/2010   HP polyp, diverticulosis, rpt 10 yrs ([name])   CORONARY ANGIOPLASTY  11/30/2000   LCX   CORONARY ARTERY BYPASS GRAFT  1990   3 vessel    CORONARY STENT INTERVENTION [name] 01/13/2023   Procedure: CORONARY STENT INTERVENTION;  Surgeon: Darron Deatrice LABOR, MD;  Location: [facility];  Service: Cardiovascular;  Laterality: [name];   CYSTOSCOPY  12/23/2010   [facility]   CYSTOSCOPY WITH BIOPSY [name] 01/16/2024   Procedure: CYSTOSCOPY, WITH BIOPSY;  Surgeon: Twylla Glendia BROCKS, MD;  Location: [facility];  Service: Urology;  Laterality: [name];   EYE SURGERY     JOINT REPLACEMENT     KNEE ARTHROSCOPY Right    x2   LAMINOTOMY  1986   L5/S1 lumbar laminotomy for two ruptured discs/fusion   LEFT HEART CATH AND CORS/GRAFTS ANGIOGRAPHY [name] 01/19/2024   Procedure: LEFT HEART CATH AND CORS/GRAFTS ANGIOGRAPHY;  Surgeon: Mady Bruckner, MD;  Location: [facility];  Service: Cardiovascular;  Laterality: [name];   LUMBAR LAMINECTOMY/DECOMPRESSION  MICRODISCECTOMY [name] 07/13/2016   Procedure: LUMBAR TWO-THREE, LUMBAR THREE-FOUR, LUMBAR FOUR-FIVE LAMINECTOMY AND FORAMINOTOMY;  Surgeon: Reyes Budge, MD;  Location: [facility];  Service: Neurosurgery;  Laterality: [name];  LAMINECTOMY AND FORAMINOTOMY L2-L3, L3-L4,L4-L5   LUMBAR SPINE SURGERY  06/2020   Bilateral redo laminectomy/laminotomy/foraminotomies/medial facetectomy to decompress the bilateral L4 and L5 nerve roots Gavin)   REVERSE SHOULDER ARTHROPLASTY Right 12/21/2023   Procedure: ARTHROPLASTY, SHOULDER, TOTAL, REVERSE;  Surgeon: Melita Drivers, MD;  Location: [facility];  Service: Orthopedics;  Laterality: Right;   RIGHT/LEFT HEART CATH AND CORONARY ANGIOGRAPHY Bilateral 01/13/2023   Procedure: RIGHT/LEFT HEART CATH  AND CORONARY ANGIOGRAPHY;  Surgeon: Darron Deatrice LABOR, MD;  Location: [facility];  Service: Cardiovascular;  Laterality: Bilateral;   SPINE SURGERY     TOTAL HIP ARTHROPLASTY Bilateral 1999,2000   TOTAL KNEE ARTHROPLASTY Right 03/06/2017   Procedure: RIGHT TOTAL KNEE ARTHROPLASTY;  Surgeon: Melodi Lerner, MD;  Location: [facility];  Service: Orthopedics;  Laterality: Right;   TRANSURETHRAL RESECTION OF BLADDER TUMOR [name] 10/19/2021   Procedure: TRANSURETHRAL RESECTION OF BLADDER TUMOR (TURBT);  Surgeon: Twylla Glendia BROCKS, MD;  Location: [facility];  Service: Urology;  Laterality: [name];   TRANSURETHRAL RESECTION OF BLADDER TUMOR [name] 11/15/2022   Procedure: TRANSURETHRAL RESECTION OF BLADDER TUMOR (TURBT);  Surgeon: Twylla Glendia BROCKS, MD;  Location: [facility];  Service: Urology;  Laterality: [name];   Patient Active Problem List   Diagnosis Date Noted   HFrEF (heart failure with reduced ejection fraction) (HCC) 01/05/2024   Urinary incontinence 01/05/2024   Rotator cuff tear arthropathy of right shoulder 09/08/2023   Sinus headache 08/02/2023   Bradycardia 08/02/2023   Abnormal stress test 01/14/2023   Angina pectoris (HCC) 01/13/2023   Retinal artery occlusion, branch, left  10/04/2022   Cervical spondylosis with radiculopathy 07/11/2022   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   OSA (obstructive sleep apnea)    Chronic heart failure with preserved ejection fraction (HFpEF) (HCC)    Nocturnal hypoxia 06/11/2021   CKD (chronic kidney disease) stage 3, GFR 30-59 ml/min (HCC) 02/24/2021   Peripheral neuropathy 02/23/2021   Spondylolisthesis of lumbar region 07/07/2020   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   Coccydynia 09/20/2019   Nasal sinus congestion 07/19/2019   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   Anxiety 06/04/2019   Chronic right shoulder pain 09/10/2018   Cervical stenosis of spinal canal 06/05/2017   Peripheral edema 06/05/2017   Health maintenance examination 12/06/2016   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/13/2016   Overweight (BMI 25.0-29.9) 07/04/2016   Low vitamin B12 level 01/01/2016   Exertional dyspnea 07/07/2015   Unstable angina (HCC)    Medicare annual wellness visit, subsequent 07/03/2015   Advanced care planning/counseling discussion 07/03/2015   Type 2 diabetes mellitus with other specified complication (HCC) 05/04/2015   Pleuritic chest pain 05/04/2015   Ex-smoker 05/04/2015   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   Spermatocele 04/02/2013   Syncopal vertigo 11/04/2010   Lumbar disc disease with radiculopathy 11/04/2010   Serous otitis media 06/07/2010   Coronary artery disease involving native coronary artery of native heart with unstable angina pectoris (HCC) 08/11/2009   PAD (peripheral artery disease) (HCC) 08/11/2009   DUPUYTREN'S CONTRACTURE, 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   Carotid stenosis 07/09/2008   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 His CPAP machine was not working at home and he was only able to get 4 hours of sleep.    Hand dominance: right  PERTINENT HISTORY: Reverse TSA wiith Dr Melita 2nd week of May 2025. Pt wore sling x 2 weeks, then DC. Pt repors use of polar care, also says he's been moving his arm around a lot and its doing well. Pain controlled. Pt not aware of anti extension, anti IR precautions, but reports he would never do these anyway. Pt just DC from hospital 5 days prior to evaluation here after cardiac chest pain problem, underwent left radial cath, was on heaprin x3 days, now back home.  PAIN:  Are you having pain? No PRECAUTIONS: No shoulder extension, no IR+ADD shoulder right  WEIGHT BEARING RESTRICTIONS: No lifting, no body weight bearing  FALLS:  Has patient fallen in last 6 months? No PATIENT GOALS: improved arm function   NEXT MD VISIT: 2nd week June 2025  OBJECTIVE:  Note: Objective measures were completed at Evaluation unless otherwise noted.  PATIENT SURVEYS :  QUIcK DASH: 70.5% (01/25/24)   UPPER EXTREMITY ROM:    A/PROM Right eval Right  01/25/24 Right  02/20/24 Right   7/10 Left  7/10  Right  02/27/24 Right  02/29/24  Shoulder flexion 90 degrees  108 degrees 130 deg  150 deg 160 deg   130 deg  140 deg   Shoulder extension prohibited --       Shoulder abduction 45 degrees 78 degrees 115 deg   95/110   120 deg  115 deg   120 deg  Shoulder internal  rotation at 90 deg abd  Full  --  70 deg   60 deg 80 deg  Shoulder external rotation at 90 deg abd -20 degrees --  30/35  18 deg 40 deg   Shoulder ER at 0 deg     40 50  30    Shoulder IR at 0 deg     NT NT     Elbow flexion Full  Full       Elbow extension Full  Full        (Blank rows = not tested)                                          PALPATION:  General anterior upper chest tightness and focal edema near surgical scar and pec major  TODAY'S TREATMENT   02/29/24: All performed on RUE   THEREX: All single UE exercises on RUE   Shoulder ROM measurements Shoulder Flexion/Extension AAROM with wand   Shoulder Shrugs with 3 sec hold 1 x 10  OMEGA Seated Rows #25 2 x 10   Shoulder Abduction/Adduction Wall Walks with terminal stretch 2 x 10 with 10 sec hold     NMR: Mirror present for visual  Shoulder Flex AROM 2 x 20  -mod VC to decrease shoulder shrug   Shoulder Scaption AROM 2 x 20   -mod VC to decrease shoulder shrug      PATIENT EDUCATION: Education details: See above  Person educated: Runner, broadcasting/film/video: collaborative learning, deliberate practice, positive reinforcement, explicit instruction, establish rules. Education comprehension: fair   HOME EXERCISE PROGRAM: Access Code: [email or web address] URL: [email or web address] Date: 02/29/2024 Prepared by: Toribio Servant  Program Notes Shoulder Slutes on right  shoulder 2 x 10  3-4 days per week   Exercises - Seated Shoulder Flexion AAROM with Pulley Behind  - 1 x daily - 7 x weekly - 3 sets - 10 reps - Seated Shoulder Abduction AAROM with Pulley Behind  - 1 x daily - 7 x weekly - 3 sets - 10 reps - Seated Upper Trapezius Stretch  - 1 x daily - 7 x weekly - 3 reps - 60 sec  hold - Supine Chest Stretch with Elbows Bent  - 1 x daily - 7 x weekly - 3 reps - 60 sec hold - Seated Overhead Shoulder External Rotation Stretch with Towel  - 1 x daily - 7 x weekly - 3 reps - 30-60 sec hold - Seated AAROM Shoulder  External Rotation at 90 Degrees with Counter Support Using Cane  - 1 x daily - 7 x weekly - 3 sets - 10 reps - Standing Shoulder External Rotation AAROM with Dowel  - 1 x daily - 7 x weekly - 3 sets - 10 reps - Standing Shoulder Flexion Wall Walk  - 1 x daily - 7 x weekly - 2 sets - 10 reps - Standing Shoulder Abduction Wall Slide   - 1 x daily - 7 x weekly - 2 sets - 10 reps - Shoulder Flexion and Extension with Coordinated Breathing  - 3-4 x weekly - 3 sets - 10 reps - Standing Shoulder Scaption  - 3-4 x weekly - 3 sets - 10 reps  ASSESSMENT:  CLINICAL IMPRESSION: Pt is now 9 weeks post op right reverse total shoulder. He is progressing towards goals with improved shoulder ROM. Despite his fatigue from lack of sleep, pt remained highly motivated and he was able to complete all his exercises. Pt continues to compensate with upper trap activation and shoulder shrug.    Pt exhibits decreased right shoulder ROM at this visit. However, this is likely from ongoing stiffness from having not completed over the weekend and likely not moving the shoulder as much due to driving. He also shows decreased perception of right shoulder function as a result of increased QuickDash score. Pt is slightly behind ROM goals for this phase of rehab and this will be the focus of ongoing sessions. He will continue to benefit from skilled PT intervention to improve ROM and strength in RUE in order to restore to PLOF in ADL, IADL, and leisure activity.     OBJECTIVE IMPAIRMENTS: decreased activity tolerance, decreased knowledge of condition, decreased knowledge of use of DME, decreased mobility, decreased ROM, and decreased strength.  ACTIVITY LIMITATIONS: carrying, lifting, sleeping, bathing, toileting, dressing, and reach over head PARTICIPATION LIMITATIONS: meal prep, cleaning, laundry, medication management, interpersonal relationship, driving, shopping, and community activity PERSONAL FACTORS: Age, Behavior pattern,  Education, Fitness, Past/current experiences, Profession, and Social background are also affecting patient's functional outcome.  REHAB POTENTIAL: Fair   CLINICAL DECISION MAKING: Medium  EVALUATION COMPLEXITY: Moderate  GOALS: Goals reviewed with patient? No  SHORT TERM GOALS: Target date: 02/22/24  Patient will report comprehension, confidence, and consistent compliance and of a simple home exercise program established to facilitate symptoms management and basic strengthening and/or segment mobility.   Baseline: issued at visit 1 and 2  Goal status: MET    2.  Patient to demonstrate improved score on self-report measure by >9% to indicate reduced self-reported disability and/or pain.    Baseline:  Quick DASH 70.5%  02/27/24: 48%  Goal status: NOT MET    LONG TERM  GOALS: Target date: 03/19/24  Patient to improve score on self-report measure by 20% or greater to indicate reduced disability and improved quality of life.   Baseline: Bertrand 70.5% 7/115/25: 48% Goal status: Progressing   2.  Patient to demonstrate improvement in strength testing by minimum of 1 grade to indicate improved motor performance.   Baseline: 3/5 or less all groups  Goal status: Progressing   3.  Patient to demonstrate improvement in RUE shoulder A/ROM without pain limitations >100 degrees flexion, 75 degrees ABDCT. Baseline: Shoulder Flex R/L 120/180, Shoulder Abd R/L 80/NT, Shoulder ER at 60 deg abd R/L 15/ NT 02/22/24 Shoulder Flex 160, Abd 95  Goal status: ACHIEVED   PLAN: PT FREQUENCY: 1-2x/week PT DURATION: 8 weeks PLANNED INTERVENTIONS: 97750- Physical Performance Testing, 97110-Therapeutic exercises, 97530- Therapeutic activity, [id number]- Neuromuscular re-education, 97535- Self Care, [id number]- Manual therapy, [id number]- Electrical stimulation (unattended), 02967- Electrical stimulation (manual), Patient/Family education, Cryotherapy, and Moist heat  PLAN FOR NEXT SESSION: Shoulder MMT. Doorway pec stretch.  Periscapular stretching: horizontal abduction and bent over Y's.   Toribio Servant PT, DPT  [facility] [address] [place], [address] Phone: [phone]   Fax:  [phone]

## 2024-03-05 ENCOUNTER — Ambulatory Visit: Admitting: Physical Therapy

## 2024-03-05 DIAGNOSIS — M25611 Stiffness of right shoulder, not elsewhere classified: Secondary | ICD-10-CM

## 2024-03-05 NOTE — Therapy (Signed)
 OUTPATIENT PHYSICAL THERAPY TREATMENT  Patient Name: [name] MRN: [id number] DOB:[birth year], 83 y.o., male Today's Date: 03/05/2024  END OF SESSION:  PT End of Session - 03/05/24 1309     Visit Number 9    Number of Visits 16    Date for PT Re-Evaluation 03/19/24    Authorization Type HTA    Authorization Time Period 01/23/24-03/19/24    Authorization - Visit Number 9    Authorization - Number of Visits 20    Progress Note Due on Visit 10    PT Start Time 1305    PT Stop Time 1345    PT Time Calculation (min) 40 min    Activity Tolerance Patient tolerated treatment well    Behavior During Therapy [facility] for tasks assessed/performed            Past Medical History:  Diagnosis Date   Actinic keratosis    Allergy    Anginal pain (HCC)    Anxiety    Aortic ectasia, abdominal (HCC)    a.) CT abd 01/01/2022: 2.9 cm infrarenal abdominal aorta   Aortic stenosis    Asthma    Bladder cancer (HCC) 2023   BPH (benign prostatic hyperplasia) 09/06/2007   CAD s/p CABG    a.) 1990 s/p MI--> CABG x 3; b.) LHC/PCI 2002 --> BMS (unk type) to LCx   Carotid arterial disease (HCC)    a.) 08/2016 Carotid U/S: <39% bilat; b.) carotid doppler 10/12/2022: mild-to-moderate (<50%) bilateral bifurcation plaque.   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 06/2019 Echo (in setting of COVID): EF 35-40%, mild LVH, g1 DD, glob HK; b. 09/2019 Echo: EF 50-55%, Gr1 DD; c. 03/2021 Echo: EF 50-55%, no rwma, mild LVH, GrI DD, nl RV fxn. RVSP 44.97mmHg. Mild-mod dil LA. Triv MR. Mild-mod Ao sclerosis.   History of 2019 novel coronavirus disease (COVID-19) 05/20/2019   History of hiatal hernia    History of shingles    HLD (hyperlipidemia) 04/26/2007   HTN (hypertension) 04/30/2007   LBBB (left bundle branch block)    Long-term use of aspirin  therapy    Lumbar disc disease with radiculopathy    Migraines    Myocardial infarction (HCC) 1990   a.) resulted in 3v CABG   Myocardial infarction (HCC) 2002   a.) second cardiac event; PCI with placement of BMS (unknown type) to LCx   Neuropathy of both feet    OSA on CPAP    Osteoarthritis    Oxygen  deficiency    PAD (peripheral artery disease) (HCC)    a. 07/2017 LE duplex: RSFA 75-23m, LSFA 75-21m, 50-74d; c. 08/2018 Periph Angio: No signif AoIliac dzs. Mod-sev Ca2+ RSFA w/ diff dzs throughout- 3 vessel runoff. Borderline signif LSFA dzs w/ mod-sev Ca2+ vessels and  3 vessel runoff below the knee-->Med rx.   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   Right mid dorsum forearm - in situ - EDC   Stroke (HCC)    T2DM (type 2 diabetes mellitus) (HCC)    Traumatic open wound of lower leg with delayed healing, left 12/28/2022   Past Surgical History:  Procedure Laterality Date   ABDOMINAL AORTOGRAM W/LOWER EXTREMITY [name] 09/12/2018   Procedure: ABDOMINAL AORTOGRAM W/LOWER EXTREMITY;  Surgeon: Darron Deatrice LABOR, MD;  Location: [facility];  Service: Cardiovascular;  Laterality:  [name];   ANTERIOR CERVICAL DECOMP/DISCECTOMY FUSION [name] 07/11/2022   Anterior Cervical Decompression Fusion ,Interboy Prothesis,Plate/Screws , Cervical four-five,Cervical five-six Gavin, Reyes, MD)   APPENDECTOMY     rupture   BACK SURGERY     1984 and then another one [facility]   BLADDER INSTILLATION [name] 10/19/2021   Procedure: BLADDER INSTILLATION OF GEMCITABINE ;  Surgeon: Twylla Glendia BROCKS, MD;  Location: [facility];  Service: Urology;  Laterality: [name];   BLADDER INSTILLATION [name] 11/15/2022   Procedure: BLADDER INSTILLATION OF GEMCITABINE ;  Surgeon: Twylla Glendia BROCKS, MD;  Location: [facility];  Service: Urology;  Laterality: [name];   CARDIAC CATHETERIZATION [name] 07/07/2015   Procedure: Left Heart Cath and Cors/Grafts Angiography;  Surgeon: Evalene JINNY Lunger, MD;  Location: [facility];  Service: Cardiovascular;  Laterality: [name];   CATARACT EXTRACTION, BILATERAL  2009   CERVICAL SPINE SURGERY  12/2016   cervical stenosis Gavin)   COLONOSCOPY  03/2010   HP polyp, diverticulosis, rpt 10 yrs ([name])   CORONARY ANGIOPLASTY  11/30/2000   LCX   CORONARY ARTERY BYPASS GRAFT  1990   3 vessel    CORONARY STENT INTERVENTION [name] 01/13/2023   Procedure: CORONARY STENT INTERVENTION;  Surgeon: Darron Deatrice LABOR, MD;  Location: [facility];  Service: Cardiovascular;  Laterality: [name];   CYSTOSCOPY  12/23/2010   [facility]   CYSTOSCOPY WITH BIOPSY [name] 01/16/2024   Procedure: CYSTOSCOPY, WITH BIOPSY;  Surgeon: Twylla Glendia BROCKS, MD;  Location: [facility];  Service: Urology;  Laterality: [name];   EYE SURGERY     JOINT REPLACEMENT     KNEE ARTHROSCOPY Right    x2   LAMINOTOMY  1986   L5/S1 lumbar laminotomy for two ruptured discs/fusion   LEFT HEART CATH AND CORS/GRAFTS ANGIOGRAPHY [name] 01/19/2024   Procedure: LEFT HEART CATH AND CORS/GRAFTS ANGIOGRAPHY;  Surgeon: Mady Bruckner, MD;  Location: [facility];  Service: Cardiovascular;  Laterality: [name];   LUMBAR LAMINECTOMY/DECOMPRESSION  MICRODISCECTOMY [name] 07/13/2016   Procedure: LUMBAR TWO-THREE, LUMBAR THREE-FOUR, LUMBAR FOUR-FIVE LAMINECTOMY AND FORAMINOTOMY;  Surgeon: Reyes Budge, MD;  Location: [facility];  Service: Neurosurgery;  Laterality: [name];  LAMINECTOMY AND FORAMINOTOMY L2-L3, L3-L4,L4-L5   LUMBAR SPINE SURGERY  06/2020   Bilateral redo laminectomy/laminotomy/foraminotomies/medial facetectomy to decompress the bilateral L4 and L5 nerve roots Gavin)   REVERSE SHOULDER ARTHROPLASTY Right 12/21/2023   Procedure: ARTHROPLASTY, SHOULDER, TOTAL, REVERSE;  Surgeon: Melita Drivers, MD;  Location: [facility];  Service: Orthopedics;  Laterality: Right;   RIGHT/LEFT HEART CATH AND CORONARY ANGIOGRAPHY Bilateral 01/13/2023   Procedure: RIGHT/LEFT HEART CATH  AND CORONARY ANGIOGRAPHY;  Surgeon: Darron Deatrice LABOR, MD;  Location: [facility];  Service: Cardiovascular;  Laterality: Bilateral;   SPINE SURGERY     TOTAL HIP ARTHROPLASTY Bilateral 1999,2000   TOTAL KNEE ARTHROPLASTY Right 03/06/2017   Procedure: RIGHT TOTAL KNEE ARTHROPLASTY;  Surgeon: Melodi Lerner, MD;  Location: [facility];  Service: Orthopedics;  Laterality: Right;   TRANSURETHRAL RESECTION OF BLADDER TUMOR [name] 10/19/2021   Procedure: TRANSURETHRAL RESECTION OF BLADDER TUMOR (TURBT);  Surgeon: Twylla Glendia BROCKS, MD;  Location: [facility];  Service: Urology;  Laterality: [name];   TRANSURETHRAL RESECTION OF BLADDER TUMOR [name] 11/15/2022   Procedure: TRANSURETHRAL RESECTION OF BLADDER TUMOR (TURBT);  Surgeon: Twylla Glendia BROCKS, MD;  Location: [facility];  Service: Urology;  Laterality: [name];   Patient Active Problem List   Diagnosis Date Noted   HFrEF (heart failure with reduced ejection fraction) (HCC) 01/05/2024   Urinary incontinence 01/05/2024   Rotator cuff tear arthropathy of right shoulder 09/08/2023   Sinus headache 08/02/2023   Bradycardia 08/02/2023   Abnormal stress test 01/14/2023   Angina pectoris (HCC) 01/13/2023   Retinal artery occlusion, branch, left  10/04/2022   Cervical spondylosis with radiculopathy 07/11/2022   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   OSA (obstructive sleep apnea)    Chronic heart failure with preserved ejection fraction (HFpEF) (HCC)    Nocturnal hypoxia 06/11/2021   CKD (chronic kidney disease) stage 3, GFR 30-59 ml/min (HCC) 02/24/2021   Peripheral neuropathy 02/23/2021   Spondylolisthesis of lumbar region 07/07/2020   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   Coccydynia 09/20/2019   Nasal sinus congestion 07/19/2019   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   Anxiety 06/04/2019   Chronic right shoulder pain 09/10/2018   Cervical stenosis of spinal canal 06/05/2017   Peripheral edema 06/05/2017   Health maintenance examination 12/06/2016   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/13/2016   Overweight (BMI 25.0-29.9) 07/04/2016   Low vitamin B12 level 01/01/2016   Exertional dyspnea 07/07/2015   Unstable angina (HCC)    Medicare annual wellness visit, subsequent 07/03/2015   Advanced care planning/counseling discussion 07/03/2015   Type 2 diabetes mellitus with other specified complication (HCC) 05/04/2015   Pleuritic chest pain 05/04/2015   Ex-smoker 05/04/2015   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   Spermatocele 04/02/2013   Syncopal vertigo 11/04/2010   Lumbar disc disease with radiculopathy 11/04/2010   Serous otitis media 06/07/2010   Coronary artery disease involving native coronary artery of native heart with unstable angina pectoris (HCC) 08/11/2009   PAD (peripheral artery disease) (HCC) 08/11/2009   DUPUYTREN'S CONTRACTURE, 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   Carotid stenosis 07/09/2008   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   Benign prostatic hyperplasia with urinary obstruction 09/06/2007   Essential hypertension 04/30/2007   GERD 04/30/2007   Hyperlipidemia associated with type 2 diabetes mellitus (HCC) 04/26/2007   OA (osteoarthritis) of knee 04/26/2007   DVT, HX  OF 04/26/2007    PCP: Rilla Baller, MD REFERRING PROVIDER: Franky Pointer, MD (ortho)  REFERRING DIAG: s/p Rt rTSA  THERAPY DIAG:  Stiffness of right shoulder, not elsewhere classified Rationale for Evaluation and Treatment: Rehabilitation ONSET DATE:   SUBJECTIVE:  SUBJECTIVE STATEMENT: Pt reports that his right shoulder is paining him in shoulder blade area. He was opening door and his arm jerked.    Hand dominance: right  PERTINENT HISTORY: Reverse TSA wiith Dr Melita 2nd week of May 2025. Pt wore sling x 2 weeks, then DC. Pt repors use of polar care, also says he's been moving his arm around a lot and its doing well. Pain controlled. Pt not aware of anti extension, anti IR precautions, but reports he would never do these anyway. Pt just DC from hospital 5 days prior to evaluation here after cardiac chest pain problem, underwent left radial cath, was on heaprin x3 days, now back home.  PAIN:  Are you having pain? No PRECAUTIONS: No shoulder extension, no IR+ADD shoulder right  WEIGHT BEARING RESTRICTIONS: No lifting, no body weight bearing  FALLS:  Has patient fallen in last 6 months? No PATIENT GOALS: improved arm function   NEXT MD VISIT: 2nd week June 2025  OBJECTIVE:  Note: Objective measures were completed at Evaluation unless otherwise noted.  PATIENT SURVEYS :  QUIcK DASH: 70.5% (01/25/24)   UPPER EXTREMITY ROM:    A/PROM Right eval Left  7/10  Right  02/27/24 Right  02/29/24 Right  03/05/24  Shoulder flexion 90 degrees  160 deg   130 deg  140 deg  130/ NT   Shoulder extension prohibited      Shoulder abduction 45 degrees 120 deg  115 deg   120 deg 115/ NT  Shoulder internal rotation at 90 deg abd  Full   60 deg 80 deg   Shoulder external rotation at 90 deg abd -20 degrees  18 deg 40  deg    Shoulder ER at 0 deg   50  30     Shoulder IR at 0 deg   NT      Elbow flexion Full       Elbow extension Full       (Blank rows = not tested)                                         SHOULDER MMT      MMT Right 03/05/24 Left  03/05/24   Shoulder flexion 4     Shoulder extension    Shoulder abduction 4   Shoulder internal rotation at 90 deg abd     Shoulder external rotation at 90 deg abd    Shoulder ER at 0 deg     Shoulder IR at 0 deg     Elbow flexion     Elbow extension    (Blank rows = not tested)                                   PALPATION:  Posterior deltoid on right shoulder TTP  TODAY'S TREATMENT    03/05/24: All single UE exercises on RUE   THEREX  UBE with seat at 8 2.5 min forward and 2.5 min backward   Seated Scapular Rows with black band 1 x 10  Palpation on posterior deltoid right shoulder TTP   Shoulder Flexion with thumb up: +  Shoulder Flexion with thumb down: -   Cross body stretch for posterior delt 30 sec x 3   Shoulder Flexion/Extension AAROM with #5 AW on wand 2 x 10  Shoulder Flexion/Extension #2 DB 1 x 10   Shoulder Rhythmic Stabilization 90 deg clockwise and counterclockwise x 30   Supine Shoulder Pec Stretch with horizontal Abduction 2 x 60 sec   Shoulder Flexion AROM 1 x 10  -still shoulder shrug with compensation. Shoulder Abduction Abduction 1 x 10   PATIENT EDUCATION: Education details: See above  Person educated: Runner, broadcasting/film/video: collaborative learning, deliberate practice, positive reinforcement, explicit instruction, establish rules. Education comprehension: fair   HOME EXERCISE PROGRAM: Access Code: [email or web address] URL: [email or web address] Date: 03/05/2024 Prepared by: Toribio Servant  Program Notes Shoulder Slutes on right shoulder 2 x 10  3-4 days per week   Exercises - Seated Shoulder Flexion AAROM with Pulley Behind  - 1 x daily - 7 x weekly - 3 sets - 10 reps - Seated Shoulder  Abduction AAROM with Pulley Behind  - 1 x daily - 7 x weekly - 3 sets - 10 reps - Seated Upper Trapezius Stretch  - 1 x daily - 7 x weekly - 3 reps - 60 sec  hold - Supine Chest Stretch on Foam Roll  - 1 x daily - 7 x weekly - 3 reps - 60 sec hold - Seated Overhead Shoulder External Rotation Stretch with Towel  - 1 x daily - 7 x weekly - 3 reps - 30-60 sec hold - Seated AAROM Shoulder External Rotation at 90 Degrees with Counter Support Using Cane  - 1 x daily - 7 x weekly - 3 sets - 10 reps - Standing Shoulder External Rotation AAROM with Dowel  - 1 x daily - 7 x weekly - 3 sets - 10 reps - Standing Shoulder Flexion Wall Walk  - 1 x daily - 7 x weekly - 2 sets - 10 reps - Standing Shoulder Abduction Wall Slide   - 1 x daily - 7 x weekly - 2 sets - 10 reps - Shoulder Flexion and Extension with Coordinated Breathing  - 3-4 x weekly - 3 sets - 10 reps - Standing Shoulder Scaption  - 3-4 x weekly - 3 sets - 10 reps  ASSESSMENT:  CLINICAL IMPRESSION: Pt is now 9 weeks post op right reverse total shoulder. Pt limited by right posterior delt pain during session likely from muscle strain. He continues to have increased muscular tension restricting motion of joint with axilla and pec tightness being contributors. He was still able to perform all the exercises without a significant increase in his baseline pain. He will continue to benefit from skilled PT intervention to improve ROM and strength in RUE in order to restore to PLOF in ADL, IADL, and leisure activity.     OBJECTIVE IMPAIRMENTS: decreased activity tolerance, decreased knowledge of condition, decreased knowledge of use of DME, decreased mobility, decreased ROM, and decreased strength.  ACTIVITY LIMITATIONS: carrying, lifting, sleeping, bathing, toileting, dressing, and reach over head PARTICIPATION LIMITATIONS: meal prep, cleaning, laundry, medication management, interpersonal relationship, driving, shopping, and community activity PERSONAL  FACTORS: Age, Behavior pattern, Education, Fitness, Past/current experiences, Profession, and Social background are also affecting patient's functional outcome.  REHAB POTENTIAL: Fair   CLINICAL DECISION MAKING: Medium  EVALUATION COMPLEXITY: Moderate  GOALS: Goals reviewed with patient? No  SHORT TERM GOALS: Target date: 02/22/24  Patient will report comprehension, confidence, and consistent compliance and of a simple home exercise program established to facilitate symptoms management and basic strengthening and/or segment mobility.   Baseline: issued at visit 1 and 2  Goal status: MET    2.  Patient  to demonstrate improved score on self-report measure by >9% to indicate reduced self-reported disability and/or pain.    Baseline:  Quick DASH 70.5%  02/27/24: 48%  Goal status: NOT MET    LONG TERM GOALS: Target date: 03/19/24  Patient to improve score on self-report measure by 20% or greater to indicate reduced disability and improved quality of life.   Baseline: Bertrand 70.5% 7/115/25: 48% Goal status: Progressing   2.  Patient to demonstrate improvement in strength testing by minimum of 1 grade to indicate improved motor performance.   Baseline: 3/5 or less all groups  Goal status: Progressing   3.  Patient to demonstrate improvement in RUE shoulder A/ROM without pain limitations >100 degrees flexion, 75 degrees ABDCT. Baseline: Shoulder Flex R/L 120/180, Shoulder Abd R/L 80/NT, Shoulder ER at 60 deg abd R/L 15/ NT 02/22/24 Shoulder Flex 160, Abd 95  Goal status: ACHIEVED   PLAN: PT FREQUENCY: 1-2x/week PT DURATION: 8 weeks PLANNED INTERVENTIONS: 97750- Physical Performance Testing, 97110-Therapeutic exercises, 97530- Therapeutic activity, [id number]- Neuromuscular re-education, 97535- Self Care, [id number]- Manual therapy, [id number]- Electrical stimulation (unattended), 02967- Electrical stimulation (manual), Patient/Family education, Cryotherapy, and Moist heat  PLAN FOR NEXT SESSION: Reassess  goals. Shoulder Stretches: lat stretch and pec stretch. Periscapular stretching: horizontal abduction and bent over Y's.   Toribio Servant PT, DPT  [facility] [address] [place], [address] Phone: [phone]   Fax:  [phone]

## 2024-03-07 ENCOUNTER — Ambulatory Visit: Admitting: Physical Therapy

## 2024-03-07 ENCOUNTER — Other Ambulatory Visit: Payer: Self-pay

## 2024-03-07 DIAGNOSIS — M5459 Other low back pain: Secondary | ICD-10-CM | POA: Diagnosis not present

## 2024-03-07 DIAGNOSIS — C679 Malignant neoplasm of bladder, unspecified: Secondary | ICD-10-CM

## 2024-03-07 MED ORDER — PROCHLORPERAZINE MALEATE 10 MG PO TABS: 10.0000 mg | ORAL_TABLET | Freq: Four times a day (QID) | ORAL | 1 refills | Status: AC | PRN

## 2024-03-07 MED ORDER — SODIUM BICARBONATE 650 MG PO TABS: ORAL_TABLET | ORAL | 0 refills | Status: DC

## 2024-03-07 MED ORDER — ONDANSETRON HCL 8 MG PO TABS: 8.0000 mg | ORAL_TABLET | Freq: Three times a day (TID) | ORAL | 1 refills | Status: AC | PRN

## 2024-03-07 MED ORDER — PHENAZOPYRIDINE HCL 200 MG PO TABS: 200.0000 mg | ORAL_TABLET | Freq: Three times a day (TID) | ORAL | 1 refills | Status: AC | PRN

## 2024-03-08 ENCOUNTER — Inpatient Hospital Stay

## 2024-03-08 NOTE — Progress Notes (Signed)
 Pharmacist Chemotherapy Monitoring - Initial Assessment    Anticipated start date: 03/15/24   The following has been reviewed per standard work regarding the patient's treatment regimen: The patient's diagnosis, treatment plan and drug doses, and organ/hematologic function Lab orders and baseline tests specific to treatment regimen  The treatment plan start date, drug sequencing, and pre-medications Prior authorization status  Patient's documented medication list, including drug-drug interaction screen and prescriptions for anti-emetics and supportive care specific to the treatment regimen The drug concentrations, fluid compatibility, administration routes, and timing of the medications to be used The patient's access for treatment and lifetime cumulative dose history, if applicable  The patient's medication allergies and previous infusion related reactions, if applicable   Changes made to treatment plan:  [name]  Follow up needed:  [name]   [name], RPH, 03/08/2024  2:52 PM

## 2024-03-10 NOTE — Progress Notes (Unsigned)
 Cardiology Office Note  Date:  03/11/2024   ID:  [name], DOB 06/15/1941, MRN [id number]  PCP:  Rilla Baller, MD   Chief Complaint  Patient presents with   1 month follow up    Patient c/o weakness and fatigue.     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  CTA of the chest was negative for PE.  started on heparin  infusion.  Underwent diagnostic left heart catheterization on 01/19/24 revealing multivessel coronary artery disease with no target lesion, patent grafts.  Management advised Echo with EF 50 to 55% Carvedilol  was increased from 3.125 mg to 6.25 mg twice daily.  Secondary to side effects he stopped the medication and was changed to metoprolol  succinate 12.5 daily  In general he feels he is having poor energy Chronic back pain, difficulty sitting secondary to left hip pain radiating down into his leg  Long discussion concerning refractory bladder cancer, scheduled to start treatments   Cardiac catheterization Jan 13, 2023 Angioplasty and stent placement to the mid to distal left circumflex Lasix  was increased up to 40  BID for moderately  elevated LV end-diastolic pressure  History of COPD exacerbation/bronchitis Echo 4/24: EF 40 to 45%,  down from 50 to 55% in 2022  Hx of Left retinal artery occlusion Cadence ordered a 2 week heart monitor Could not wear, sweating too much  Seen by Dr.Arida for lower extremity PAD in October 2023, medical management recommended  Carotid u/s 2/24 Mild-to-moderate bilateral carotid bifurcation plaque with Doppler measurements indicative of less than 50% stenosis.   Hx of Bladder cancer, completed 6 treatments  Stress test September 2022 Low risk, probably normal pharmacologic myocardial perfusion stress test.  LE arterial hx,  2.  Right lower extremity: Moderately to severely calcified SFA with diffuse disease throughout its course especially in the midsegment and three-vessel runoff below the knee. 3.  Left lower extremity: Borderline significant SFA disease which is moderately to severely calcified and three-vessel runoff below the knee.  pulmonary appointment sept, 2021  postinflammatory pulmonary issues post Covid COPD poorly compensated Changes to  his inhalers made  CT chest 06/2019 Diffuse multifocal areas of consolidation and ground-glass opacity throughout the lungs compatible with atypical infection in the setting of known COVID positivity.  Stress test 03/2019 Pharmacological myocardial perfusion imaging study with no significant ischemia Small region mild fixed perfusion defect in the distal anteroseptal and apical region, unable to exclude old scar vs attenuation artifact EF estimated at 38%, septal wall hypokinesis, possibly secondary to CABG/post-operative state.  No EKG changes concerning for ischemia at peak stress or in recovery. Low risk scan  05/2019 COVID , and hospital 10 days treated with remdesivir  and  steroids, antibodies little bit of leg swelling but he attributed to not taking diuretics   Back in the hospital again for an additional 10-day  stay October 28  he started to run another fever up to 101.3 has not had any chest pain or wheezing no headaches. blood pressure has been  running low in the 90s so he stopped using his lisinopril .  His blood pressure after that improved to 130s over 60s  CT chest,  . Diffuse multifocal areas of consolidation and ground-glass opacity throughout the lungs compatible with atypical infection in the setting of known COVID positivity.  LE arterial 08/2018,   Right lower extremity: Moderately to severely calcified SFA with diffuse disease throughout its course especially in the midsegment and three-vessel runoff below the knee.   AAA less than 3 cm, on scan in 2018  Numerous previous episode of chest pain relieved with belching consistent with GI etiology.  Previous cardiac catheterization 07/07/2015            Ost LM to LM lesion, 50% stenosed.            Ost RCA lesion, 80% stenosed.            Prox RCA-1 lesion, 80% stenosed.            Prox RCA-2 lesion, 80% stenosed.            LIMA was injected is moderate in size, and is anatomically normal.            Dist LAD lesion, 60% stenosed.            Ost LAD to Prox LAD lesion, 100% stenosed.            Mid LAD lesion, 60% stenosed.            The left ventricular systolic function is normal.            SVG was injected is normal in caliber, and is anatomically normal.            2nd Diag lesion, 50% stenosed.    severe RCA disease (small vessel) not amenable to PCI, moderate left main disease, moderate LAD and proximal diagonal disease. 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), DDD (degenerative disc disease), cervical, Difficult intubation, Diverticulosis, Dupuytren's contracture of right hand (10/29/2008), DVT (deep venous thrombosis) (HCC) (1998), Elevated prostate specific antigen (PSA) (07/09/2008), Emphysema of lung (HCC), GERD (04/30/2007), Heart murmur, HFrEF (heart failure with reduced ejection fraction) (HCC), History of 2019 novel coronavirus disease (COVID-19) (05/20/2019), History of hiatal hernia, History of shingles, HLD (hyperlipidemia) (04/26/2007), HTN (hypertension) (04/30/2007), LBBB (left bundle branch block), Long-term use of aspirin  therapy, Lumbar disc disease with radiculopathy, Migraines, Myocardial infarction (HCC) (1990), Myocardial infarction (HCC) (2002), Neuropathy of both feet, OSA on CPAP, Osteoarthritis, Oxygen  deficiency, PAD (peripheral artery disease) (HCC), Palpitations, Pneumonia (07/2014), Pneumonia due to COVID-19 virus (05/30/2019), Prostatitis, chronic, Pulmonary hypertension (HCC), Right inguinal hernia (05/26/2010), S/P CABG x 3 (1990), Sleep apnea, Spinal stenosis of lumbar  region, 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  PSH:    Past Surgical History:  Procedure Laterality Date   ABDOMINAL AORTOGRAM W/LOWER EXTREMITY [name] 09/12/2018   Procedure: ABDOMINAL AORTOGRAM W/LOWER EXTREMITY;  Surgeon: Darron Deatrice LABOR, MD;  Location: [facility];  Service: Cardiovascular;  Laterality: [name];   ANTERIOR CERVICAL DECOMP/DISCECTOMY FUSION [name] 07/11/2022   Anterior Cervical Decompression Fusion ,Interboy Prothesis,Plate/Screws , Cervical four-five,Cervical five-six Gavin, Reyes, MD)    APPENDECTOMY     rupture   BACK SURGERY     1984 and then another one [facility]   BLADDER INSTILLATION [name] 10/19/2021   Procedure: BLADDER INSTILLATION OF GEMCITABINE ;  Surgeon: Twylla Glendia BROCKS, MD;  Location: [facility];  Service: Urology;  Laterality: [name];   BLADDER INSTILLATION [name] 11/15/2022   Procedure: BLADDER INSTILLATION OF GEMCITABINE ;  Surgeon: Twylla Glendia BROCKS, MD;  Location: [facility];  Service: Urology;  Laterality: [name];   CARDIAC CATHETERIZATION [name] 07/07/2015   Procedure: Left Heart Cath and Cors/Grafts Angiography;  Surgeon: Evalene JINNY Lunger, MD;  Location: [facility];  Service: Cardiovascular;  Laterality: [name];   CATARACT EXTRACTION, BILATERAL  2009   CERVICAL SPINE SURGERY  12/2016   cervical stenosis Gavin)   COLONOSCOPY  03/2010   HP polyp, diverticulosis, rpt 10 yrs ([name])   CORONARY ANGIOPLASTY  11/30/2000   LCX   CORONARY ARTERY BYPASS GRAFT  1990   3 vessel    CORONARY STENT INTERVENTION [name] 01/13/2023   Procedure: CORONARY STENT INTERVENTION;  Surgeon: Darron Deatrice LABOR, MD;  Location: [facility];  Service: Cardiovascular;  Laterality: [name];   CYSTOSCOPY  12/23/2010   [facility]   CYSTOSCOPY WITH BIOPSY [name] 01/16/2024   Procedure: CYSTOSCOPY, WITH BIOPSY;  Surgeon: Twylla Glendia BROCKS, MD;  Location: [facility];  Service: Urology;  Laterality: [name];   EYE SURGERY     JOINT REPLACEMENT     KNEE ARTHROSCOPY Right    x2   LAMINOTOMY  1986   L5/S1 lumbar laminotomy for two ruptured discs/fusion   LEFT HEART CATH AND CORS/GRAFTS ANGIOGRAPHY [name] 01/19/2024   Procedure: LEFT HEART CATH AND CORS/GRAFTS ANGIOGRAPHY;  Surgeon: Mady Bruckner, MD;  Location: [facility];  Service: Cardiovascular;  Laterality: [name];   LUMBAR LAMINECTOMY/DECOMPRESSION MICRODISCECTOMY [name] 07/13/2016   Procedure: LUMBAR TWO-THREE, LUMBAR THREE-FOUR, LUMBAR FOUR-FIVE LAMINECTOMY AND FORAMINOTOMY;  Surgeon: Reyes Budge, MD;  Location: [facility];  Service: Neurosurgery;   Laterality: [name];  LAMINECTOMY AND FORAMINOTOMY L2-L3, L3-L4,L4-L5   LUMBAR SPINE SURGERY  06/2020   Bilateral redo laminectomy/laminotomy/foraminotomies/medial facetectomy to decompress the bilateral L4 and L5 nerve roots Gavin)   REVERSE SHOULDER ARTHROPLASTY Right 12/21/2023   Procedure: ARTHROPLASTY, SHOULDER, TOTAL, REVERSE;  Surgeon: Melita Drivers, MD;  Location: [facility];  Service: Orthopedics;  Laterality: Right;   RIGHT/LEFT HEART CATH AND CORONARY ANGIOGRAPHY Bilateral 01/13/2023   Procedure: RIGHT/LEFT HEART CATH AND CORONARY ANGIOGRAPHY;  Surgeon: Darron Deatrice LABOR, MD;  Location: [facility];  Service: Cardiovascular;  Laterality: Bilateral;   SPINE SURGERY     TOTAL HIP ARTHROPLASTY Bilateral 1999,2000   TOTAL KNEE ARTHROPLASTY Right 03/06/2017   Procedure: RIGHT TOTAL KNEE ARTHROPLASTY;  Surgeon: Melodi Lerner, MD;  Location: [facility];  Service: Orthopedics;  Laterality: Right;   TRANSURETHRAL RESECTION OF BLADDER TUMOR [name] 10/19/2021   Procedure: TRANSURETHRAL RESECTION OF BLADDER TUMOR (TURBT);  Surgeon: Twylla Glendia BROCKS, MD;  Location: [facility];  Service: Urology;  Laterality: [name];   TRANSURETHRAL RESECTION OF BLADDER TUMOR [name] 11/15/2022   Procedure: TRANSURETHRAL RESECTION OF BLADDER TUMOR (TURBT);  Surgeon: Twylla Glendia BROCKS, MD;  Location: [facility];  Service: Urology;  Laterality: [name];    Current Outpatient Medications  Medication Sig Dispense Refill   albuterol  (PROVENTIL ) (2.5 MG/3ML) 0.083% nebulizer solution TAKE 3 MLS BY NEBULIZATION EVERY 6 HOURSAS NEEDED FOR WHEEZING OR SHORTNESS OF BREATH 75 mL 12   albuterol  (VENTOLIN  HFA) 108 (90 Base) MCG/ACT inhaler TAKE 2 PUFFS INTO LUNGS EVERY 6 HOURS ASNEEDED FOR WHEEZING OR SHORTNESS OF BREATH 8.5 each 3   aspirin  EC 81 MG EC tablet Take 1 tablet (81 mg total) by mouth daily. 30 tablet 0   atorvastatin  (LIPITOR) 20 MG tablet TAKE 1 TABLET BY MOUTH DAILY 90 tablet 3   Cyanocobalamin  (VITAMIN B-12 ER PO) Take 1  capsule by mouth daily.     docusate sodium  (COLACE) 100 MG capsule Take 1 capsule (100 mg total) by mouth daily as needed for mild constipation.     Dupilumab  (DUPIXENT ) 300 MG/2ML SOAJ Inject 300 mg into the skin every 14 (fourteen) days. **patient completed loading dose in clinic** 12 mL 1   ezetimibe  (ZETIA ) 10 MG tablet TAKE 1 TABLET BY MOUTH ONCE DAILY 90 tablet 3   famotidine  (PEPCID ) 20 MG tablet Take 20 mg by mouth daily.     fluorouracil  (EFUDEX ) 5 % cream Apply to nose twice a day x 7 days 30 g 2   fluticasone  (FLONASE ) 50 MCG/ACT nasal spray Place 1 spray into both nostrils daily as needed for allergies or rhinitis.     Fluticasone -Umeclidin-Vilant (TRELEGY ELLIPTA ) 200-62.5-25 MCG/ACT AEPB Inhale 1 puff into the lungs daily. 180 each 3   furosemide  (LASIX ) 40 MG tablet Take 0.5 tablets (20 mg total) by mouth daily as needed. Take 1 tablet (40 mg) daily as need for weight greater then 208. 180 tablet 3   gabapentin  (NEURONTIN ) 600 MG tablet Take 1 tablet (600 mg total) by mouth 3 (three) times daily. 270 tablet 4   hydrocortisone  2.5 % cream Apply to chin and beside the nose at bedtime Tues, Thurs, Sat 30 g 5   isosorbide  mononitrate (IMDUR ) 30 MG 24 hr tablet TAKE 1 TABLET BY MOUTH DAILY. 90 tablet 3   ketoconazole  (NIZORAL ) 2 % cream Apply to chin and beside the nose at bedtime Mon, Wed, Fri 15 g 0   losartan  (COZAAR ) 25 MG tablet Take 1 tablet (25 mg total) by mouth daily. 90 tablet 3   metoprolol  succinate (TOPROL  XL) 25 MG 24 hr tablet Take 0.5 tablets (12.5 mg total) by mouth daily. 45 tablet 3   montelukast  (SINGULAIR ) 10 MG tablet TAKE 1 TABLET BY MOUTH DAILY 30 tablet 11   mupirocin  ointment (BACTROBAN ) 2 % Apply 1 Application topically 2 (two) times daily. 22 g 0   nitroGLYCERIN  (NITROSTAT ) 0.4 MG SL tablet Place 1 tablet (0.4 mg total) under the tongue every 5 (five) minutes as needed for chest pain. 25 tablet 3   ondansetron  (ZOFRAN ) 8 MG tablet Take 1 tablet (8 mg total) by  mouth every 8 (eight) hours as needed for nausea or vomiting. 30 tablet 1   phenazopyridine  (PYRIDIUM ) 200 MG tablet Take 1 tablet (200 mg total) by mouth 3 (three) times daily as needed for pain. One on day before treatment, then as needed 30 tablet 1   polyethylene glycol (MIRALAX  / GLYCOLAX ) 17 g packet Take  17 g by mouth daily as needed for mild constipation.      prochlorperazine  (COMPAZINE ) 10 MG tablet Take 1 tablet (10 mg total) by mouth every 6 (six) hours as needed for nausea or vomiting. 30 tablet 1   ranolazine  (RANEXA ) 500 MG 12 hr tablet TAKE ONE (1) TABLET BY MOUTH TWO TIMES PER DAY 180 tablet 3   sodium bicarbonate  650 MG tablet Take 2 tabs (1300 mg) of sodium bicarbonate  (two 650 mg tabs) the night before and morning of therapy 28 tablet 0   spironolactone  (ALDACTONE ) 25 MG tablet Take 0.5 tablets (12.5 mg total) by mouth daily. 45 tablet 3   tamsulosin  (FLOMAX ) 0.4 MG CAPS capsule TAKE 1 CAPSULE BY MOUTH AT BEDTIME 90 capsule 1   No current facility-administered medications for this visit.    Allergies:   Vioxx [rofecoxib], Oxycodone , Spiriva  respimat [tiotropium bromide monohydrate ], Contrast media [iodinated contrast media], and Morphine    Social History:  The patient  reports that he quit smoking about 35 years ago. His smoking use included cigarettes. He started smoking about 60 years ago. He has a 25 pack-year smoking history. He has been exposed to tobacco smoke. He has never used smokeless tobacco. He reports that he does not drink alcohol and does not use drugs.   Family History:   family history includes Cancer in his sister and another family member; Diabetes in his brother and mother; Heart attack in his mother; Heart disease in his father; Kidney disease in his father and sister; Polycystic kidney disease in his father and sister; Stroke in his father and mother.   Review of Systems: Review of Systems  Constitutional: Negative.   HENT: Negative.    Respiratory:  Negative.    Cardiovascular: Negative.   Gastrointestinal: Negative.   Musculoskeletal: Negative.   Skin:        Bruising  Neurological: Negative.   Psychiatric/Behavioral: Negative.    All other systems reviewed and are negative.  PHYSICAL EXAM: VS:  BP (!) 136/58 (BP Location: Left Arm, Patient Position: Sitting, Cuff Size: Normal)   Pulse 81   Ht 6' (1.829 m)   Wt 209 lb 8 oz (95 kg)   SpO2 95%   BMI 28.41 kg/m  , BMI Body mass index is 28.41 kg/m. Constitutional:  oriented to person, place, and time. No distress.  HENT:  Head: Grossly normal Eyes:  no discharge. No scleral icterus.  Neck: No JVD, no carotid bruits  Cardiovascular: Regular rate and rhythm, no murmurs appreciated Pulmonary/Chest: Clear to auscultation bilaterally, no wheezes or rales Abdominal: Soft.  no distension.  no tenderness.  Musculoskeletal: Normal range of motion Neurological:  normal muscle tone. Coordination normal. No atrophy Skin: Skin warm and dry Psychiatric: normal affect, pleasant  Recent Labs: 12/28/2023: ALT 10; Pro B Natriuretic peptide (BNP) 329.0 01/17/2024: B Natriuretic Peptide 535.6 01/19/2024: BUN 40; Creatinine, Ser 1.46; Hemoglobin 11.5; Platelets 160; Potassium 3.7; Sodium 139   Lipid Panel Lab Results  Component Value Date   CHOL 161 08/11/2023   HDL 41 08/11/2023   LDLCALC 96 08/11/2023   TRIG 132 08/11/2023    Wt Readings from Last 3 Encounters:  03/11/24 209 lb 8 oz (95 kg)  02/22/24 208 lb (94.3 kg)  02/12/24 208 lb 4.8 oz (94.5 kg)     ASSESSMENT AND PLAN:  Cardiomyopathy,  Low ejection fraction in the setting of COVID in 2020 recovered, EF up to 50 -55% by echo 09/2019, same in 2022, appears unchanged June 2025  Stent placed to LCX May 2024, Tolerating low-dose metoprolol  succinate, spironolactone , Lasix  20 daily Imdur  30 daily, losartan  25 daily -Stable renal function Weight stable  Cad with stable angina History of CABG  2024 with stent placement to left  circumflex -Recent cardiac catheterization June 2025 with stable disease, medical management recommended  Covid , 05/2019  COPD/emphysema Followed by pulmonary Prior smoking history, Covid 2020 On room air, feels his breathing is stable  Mixed hyperlipidemia On Lipitor 20 daily  Essential hypertension Blood pressure higher end of his range, he prefers no medication changes at this time  Bilateral carotid artery stenosis Previously evaluated 2024, at his request repeat carotid ultrasound ordered  Diet-controlled diabetes mellitus (HCC) Well-controlled, A1C 6.0  PAD At his request repeat lower extremity arterial Dopplers ordered Followed with Dr. Darron ,  bilateral SFA disease Having severe left hip pain extending into his leg  Prostate cancer Scheduled to start treatments next week He is debating whether to proceed with bladder resection surgery Certainly he would be moderate but acceptable risk for surgery if he so choose  No orders of the defined types were placed in this encounter.     Signed, Velinda Lunger, M.D., Ph.D. 03/11/2024  [facility] [place], [place] [phone]

## 2024-03-11 ENCOUNTER — Encounter: Payer: Self-pay | Admitting: Cardiovascular Disease

## 2024-03-11 ENCOUNTER — Encounter: Payer: Self-pay | Admitting: Physician Assistant

## 2024-03-11 ENCOUNTER — Ambulatory Visit: Attending: Cardiovascular Disease | Admitting: Cardiovascular Disease

## 2024-03-11 VITALS — BP 136/58 | HR 81 | Ht 72.0 in | Wt 209.5 lb

## 2024-03-11 DIAGNOSIS — I255 Ischemic cardiomyopathy: Secondary | ICD-10-CM | POA: Diagnosis not present

## 2024-03-11 DIAGNOSIS — I5020 Unspecified systolic (congestive) heart failure: Secondary | ICD-10-CM

## 2024-03-11 DIAGNOSIS — I10 Essential (primary) hypertension: Secondary | ICD-10-CM

## 2024-03-11 DIAGNOSIS — I739 Peripheral vascular disease, unspecified: Secondary | ICD-10-CM

## 2024-03-11 DIAGNOSIS — I6523 Occlusion and stenosis of bilateral carotid arteries: Secondary | ICD-10-CM

## 2024-03-11 DIAGNOSIS — I2510 Atherosclerotic heart disease of native coronary artery without angina pectoris: Secondary | ICD-10-CM

## 2024-03-11 DIAGNOSIS — E785 Hyperlipidemia, unspecified: Secondary | ICD-10-CM

## 2024-03-11 DIAGNOSIS — J449 Chronic obstructive pulmonary disease, unspecified: Secondary | ICD-10-CM

## 2024-03-11 NOTE — Patient Instructions (Addendum)
 Medication Instructions:  No changes  If you need a refill on your cardiac medications before your next appointment, please call your pharmacy.   Lab work: No new labs needed  Testing/Procedures:  Your physician has requested that you have a carotid duplex. This test is an ultrasound of the carotid arteries in your neck. It looks at blood flow through these arteries that supply the brain with blood.   Allow one hour for this exam.  There are no restrictions or special instructions.  This will take place at [address] [facility] [address], [place] [address]  Please note: We ask at that you not bring children with you during ultrasound (echo/ vascular) testing. Due to room size and safety concerns, children are not allowed in the ultrasound rooms during exams. Our front office staff cannot provide observation of children in our lobby area while testing is being conducted. An adult accompanying a patient to their appointment will only be allowed in the ultrasound room at the discretion of the ultrasound technician under special circumstances. We apologize for any inconvenience.   Your physician has recommended that you have an AORTA/ILIAC Duplex. This is a noninvasive diagnostic test that uses ultrasound technology to look at the aorta and iliac arteries to detect any restriction to blood flow to the buttocks, groin, legs or feet.  No food after 11PM the night before.  Water  is OK. (Don't drink liquids if you have been instructed not to for ANOTHER test). Avoid foods that produce bowel gas, for 24 hours prior to exam (see below). No breakfast, no chewing gum, no smoking or carbonated beverages. Patient may take morning medications with water . Come in for test at least 15 minutes early to register. This will take approximately 60 minutes This will take place at [address] ([address], [place] [address]  Your physician has requested that you have a  lower extremity arterial duplex. During this test, ultrasound is used to evaluate arterial blood flow in the legs. Allow one hour for this exam. There are no restrictions or special instructions. This will take place at [address] ([address]) [address], [place] [address]  Your physician has requested that you have an ankle brachial index (ABI). During this test an ultrasound and blood pressure cuff are used to evaluate the arteries that supply the arms and legs with blood.  Allow thirty minutes for this exam.  There are no restrictions or special instructions.  This will take place at [address] ([address]) [address], [place] [address]   Follow-Up: At [facility], you and your health needs are our priority.  As part of our continuing mission to provide you with exceptional heart care, we have created designated Provider Care Teams.  These Care Teams include your primary Cardiologist (physician) and Advanced Practice Providers (APPs -  Physician Assistants and Nurse Practitioners) who all work together to provide you with the care you need, when you need it.  You will need a follow up appointment in 6 months, [place] or [place]  Providers on your designated Care Team:   Lonni Meager, NP [place], PA-C Cadence [place], [place]  COVID-19 Vaccine Information can be found at: [email or web address] For questions related to vaccine distribution or appointments, please email [email or web address] or call [phone].

## 2024-03-11 NOTE — Progress Notes (Signed)
 Called patient after receiving voicemail per my request.  Introduced myself as Dance movement psychotherapist and to see if he had any financial questions or concerns regarding his treatment. Patient has insurance and I advised if he qualifies for any copay assistance, [facility] will reach out to him directly to discuss. Patient wasn't sure about the specifics of his insurance plan as far as deductible and OOP and percentages.   Advised patient he can contact insurance with any additional financial questions or concerns. He has my card for any additional financial questions or concerns.

## 2024-03-12 ENCOUNTER — Ambulatory Visit: Admitting: Physical Therapy

## 2024-03-12 ENCOUNTER — Encounter: Payer: Self-pay | Admitting: Physical Therapy

## 2024-03-12 DIAGNOSIS — M25611 Stiffness of right shoulder, not elsewhere classified: Secondary | ICD-10-CM | POA: Diagnosis not present

## 2024-03-12 NOTE — Progress Notes (Unsigned)
 [facility] OFFICE PROGRESS NOTE  Patient Care Team: Rilla Baller, MD as PCP - General (Family Medicine) Perla, Evalene PARAS, MD as PCP - Cardiology (Cardiology) Myra Rosaline FALCON, [facility] (Inactive) as Pharmacist (Pharmacist) Perla Evalene PARAS, MD as Consulting Physician (Cardiology) Tamea Dedra CROME, MD as Consulting Physician (Pulmonary Disease) Darron Deatrice LABOR, MD as Consulting Physician (Cardiology) Pa, [facility] (Ophthalmology) Tina Pauletta BROCKS, MD as Consulting Physician (Oncology)  [name] is a 83 y.o.male with history of DVT, CKD 3, CAD with MI, CABG, emphysema, hypertension, hyperlipidemia, type 2 diabetes, carotid artery disease, BPH, DDD being seen at [facility] for NMIBC.   Patient will start intravesical gemcitabine  with docetaxel today. Assessment & Plan   No orders of the defined types were placed in this encounter.    Pauletta BROCKS Tina, MD  INTERVAL HISTORY: Patient returns for follow-up.  Oncology History  Urothelial carcinoma of bladder (HCC)  10/19/2021 Pathology Results   DIAGNOSIS:  A. URINARY BLADDER TUMOR; TRANSURETHRAL RESECTION:  - NONINVASIVE PAPILLARY UROTHELIAL CARCINOMA, HIGH-GRADE (WHO/ISUP).  - MUSCULARIS PROPRIA PRESENT AND UNINVOLVED.   B. URINARY BLADDER, TUMOR BASE; TRANSURETHRAL RESECTION:  - BENIGN MUSCULARIS PROPRIA.  - NO DEFINITE INTACT UROTHELIUM IDENTIFIED.  - NEGATIVE FOR MALIGNANCY.   C. URINARY BLADDER, LEFT POSTERIOR WALL; TRANSURETHRAL RESECTION:  - NONINVASIVE PAPILLARY UROTHELIAL CARCINOMA, LOW-GRADE (WHO/ISUP).  - NO MUSCULARIS PROPRIA IDENTIFIED.    11/12/2021 Initial Diagnosis   Urothelial carcinoma of bladder (HCC)   11/15/2022 Pathology Results   IAGNOSIS:  A. URINARY BLADDER, RIGHT LATERAL WALL; TRANSURETHRAL RESECTION:  - NONINVASIVE PAPILLARY UROTHELIAL CARCINOMA, HIGH-GRADE (WHO/ISUP).  - NO MUSCULARIS PROPRIA IDENTIFIED.   B. URINARY BLADDER TUMOR BASE, RIGHT LATERAL WALL;  TRANSURETHRAL BIOPSY:  - PARTIALLY DENUDED, BENIGN APPEARING UROTHELIAL TISSUE.  - MUSCULARIS PROPRIA PRESENT AND UNINVOLVED.   C. URINARY BLADDER, LEFT LATERAL WALL; TRANSURETHRAL RESECTION:  - NONINVASIVE PAPILLARY UROTHELIAL CARCINOMA, LOW-GRADE (WHO/ISUP).  - NO MUSCULARIS PROPRIA IDENTIFIED.    01/16/2024 Pathology Results   1. 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,    03/14/2024 -  Chemotherapy   Patient is on Treatment Plan : BLADDER Gemcitabine  (1000), Docetaxel (37.5) INTRAVESICAL q7d        PHYSICAL EXAMINATION: ECOG PERFORMANCE STATUS: {CHL ONC ECOG PS:[phone]}  There were no vitals filed for this visit. There were no vitals filed for this visit.  Relevant data reviewed during this visit included ***

## 2024-03-12 NOTE — Assessment & Plan Note (Signed)
 Pyridium  200 mg the morning of treatment, then 3 times daily for 2 days as needed for pain sodium bicarbonate  (two 650 mg tabs) 1300 mg the night before and morning of therapy to alkalinize the urine. Premed with oxybutynin and compazine  Return weekly with lab and MD/APP visit during induction Follow up with cystoscopy every 3 months. Will communicate with Dr. Twylla

## 2024-03-12 NOTE — Therapy (Addendum)
 OUTPATIENT PHYSICAL THERAPY PROGRESS NOTE/ Re-evaluation    Dates of Reporting:  01/23/24- 03/12/24   Patient Name: [name] MRN: [id number] DOB:07-Jun-1941, 83 y.o., male Today's Date: 03/12/2024  END OF SESSION:    03/12/24 1306  PT Visits / Re-Eval  Visit Number 10  Number of Visits 16  Date for PT Re-Evaluation 03/19/24  Authorization  Authorization Type HTA  Authorization Time Period 01/23/24-03/19/24  Authorization - Visit Number 10  Authorization - Number of Visits 20  Progress Note Due on Visit 10  PT Time Calculation  PT Start Time 1300  PT Stop Time 1345  PT Time Calculation (min) 45 min  PT - End of Session  Activity Tolerance Patient tolerated treatment well  Behavior During Therapy [facility] for tasks assessed/performed      Past Medical History:  Diagnosis Date   Actinic keratosis    Allergy    Anginal pain (HCC)    Anxiety    Aortic ectasia, abdominal (HCC)    a.) CT abd 01/01/2022: 2.9 cm infrarenal abdominal aorta   Aortic stenosis    Asthma    Bladder cancer (HCC) 2023   BPH (benign prostatic hyperplasia) 09/06/2007   CAD s/p CABG    a.) 1990 s/p MI--> CABG x 3; b.) LHC/PCI 2002 --> BMS (unk type) to LCx   Carotid arterial disease (HCC)    a.) 08/2016 Carotid U/S: <39% bilat; b.) carotid doppler 10/12/2022: mild-to-moderate (<50%) bilateral bifurcation plaque.   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 06/2019 Echo (in setting of COVID): EF 35-40%, mild LVH, g1 DD, glob HK; b. 09/2019 Echo: EF 50-55%, Gr1 DD; c. 03/2021 Echo: EF 50-55%, no rwma, mild LVH, GrI DD, nl RV fxn. RVSP 44.57mmHg. Mild-mod dil LA. Triv MR. Mild-mod Ao sclerosis.   History of 2019 novel coronavirus disease (COVID-19) 05/20/2019   History of hiatal hernia    History of shingles    HLD (hyperlipidemia) 04/26/2007   HTN (hypertension) 04/30/2007   LBBB (left bundle branch block)    Long-term use of aspirin  therapy    Lumbar disc disease with radiculopathy    Migraines    Myocardial infarction (HCC) 1990   a.) resulted in 3v CABG   Myocardial infarction (HCC) 2002   a.) second cardiac event; PCI with placement of BMS (unknown type) to LCx   Neuropathy of both feet    OSA on CPAP    Osteoarthritis    Oxygen  deficiency    PAD (peripheral artery disease) (HCC)    a. 07/2017 LE duplex: RSFA 75-68m, LSFA 75-53m, 50-74d; c. 08/2018 Periph Angio: No signif AoIliac dzs. Mod-sev Ca2+ RSFA w/ diff dzs throughout- 3 vessel runoff. Borderline signif LSFA dzs w/ mod-sev Ca2+ vessels and 3 vessel runoff below the knee-->Med rx.  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   Right mid dorsum forearm - in situ - EDC   Stroke (HCC)    T2DM (type 2 diabetes mellitus) (HCC)    Traumatic open wound of lower leg with delayed healing, left 12/28/2022   Past Surgical History:  Procedure Laterality Date   ABDOMINAL AORTOGRAM W/LOWER EXTREMITY [name] 09/12/2018   Procedure: ABDOMINAL AORTOGRAM W/LOWER EXTREMITY;  Surgeon:  Darron Deatrice LABOR, MD;  [facility]: [facility];  Service: Cardiovascular;  Laterality: [name];   ANTERIOR CERVICAL DECOMP/DISCECTOMY FUSION [name] 07/11/2022   Anterior Cervical Decompression Fusion ,Interboy Prothesis,Plate/Screws , Cervical four-five,Cervical five-six Gavin, Reyes, MD)   APPENDECTOMY     rupture   BACK SURGERY     1984 and then another [facility]   BLADDER INSTILLATION [name] 10/19/2021   Procedure: BLADDER INSTILLATION OF GEMCITABINE ;  Surgeon: Twylla Glendia BROCKS, MD;  Location: [facility] ORS;  Service: Urology;  Laterality: [name];   BLADDER INSTILLATION [name] 11/15/2022   Procedure: BLADDER INSTILLATION OF GEMCITABINE ;  Surgeon: Twylla Glendia BROCKS, MD;  Location: [facility] OR[facility];  Service: Urology;  Laterality: [name];   CARDIAC CATHETERIZATION [name] 07/07/2015   Procedure: Left Heart Cath and Cors/Grafts Angiography;  Surgeon: Evalene JINNY Lunger, MD;  Location: [facility];  Service: Cardiovascular;  Laterality: [name];   CATARACT EXTRACTION, BILATERAL  2009   CERVICAL SPINE SURGERY  12/2016   cervical stenosis Gavin)   COLONOSCOPY  03/2010   HP polyp, diverticulosis, rpt 10 yrs ([name])   CORONARY ANGIOPLASTY  11/30/2000   LCX   CORONARY ARTERY BYPASS GRAFT  1990   3 vessel    CORONARY STENT INTERVENTION [name] 01/13/2023   Procedure: CORONARY STENT INTERVENTION;  Surgeon: Darron Deatrice LABOR, MD;  Location: [facility];  Service: Cardiovascular;  Laterality: [name];   CYSTOSCOPY  12/23/2010   [facility]   CYSTOSCOPY WITH BIOPSY [name] 01/16/2024   Procedure: CYSTOSCOPY, WITH BIOPSY;  Surgeon: Twylla Glendia BROCKS, MD;  Location: [facility];  Service: Urology;  Laterality: [name];   EYE SURGERY     JOINT REPLACEMENT     KNEE ARTHROSCOPY Right    x2   LAMINOTOMY  1986   L5/S1 lumbar laminotomy for two ruptured discs/fusion   LEFT HEART CATH AND CORS/GRAFTS ANGIOGRAPHY [name] 01/19/2024   Procedure: LEFT HEART CATH AND CORS/GRAFTS ANGIOGRAPHY;  Surgeon: Mady Bruckner, MD;  Location: [facility]  INVASIVE CV LAB;  Service: Cardiovascular;  Laterality: [name];   LUMBAR LAMINECTOMY/DECOMPRESSION MICRODISCECTOMY [name] 07/13/2016   Procedure: LUMBAR TWO-THREE, LUMBAR THREE-FOUR, LUMBAR FOUR-FIVE LAMINECTOMY AND FORAMINOTOMY;  Surgeon: Reyes Budge, MD;  Location: [facility];  Service: Neurosurgery;  Laterality: [name];  LAMINECTOMY AND FORAMINOTOMY L2-L3, L3-L4,L4-L5   LUMBAR SPINE SURGERY  06/2020   Bilateral redo laminectomy/laminotomy/foraminotomies/medial facetectomy to decompress the bilateral L4 and L5 nerve roots Gavin)   REVERSE SHOULDER ARTHROPLASTY Right 12/21/2023   Procedure: ARTHROPLASTY, SHOULDER, TOTAL, REVERSE;  Surgeon: Melita Drivers, MD;  Location: [facility];  Service: Orthopedics;  Laterality: Right;   RIGHT/LEFT HEART CATH AND CORONARY ANGIOGRAPHY Bilateral 01/13/2023   Procedure: RIGHT/LEFT HEART CATH AND CORONARY ANGIOGRAPHY;  Surgeon: Darron Deatrice LABOR, MD;  Location: [facility];  Service: Cardiovascular;  Laterality: Bilateral;   SPINE SURGERY     TOTAL HIP ARTHROPLASTY Bilateral 1999,2000   TOTAL KNEE ARTHROPLASTY Right 03/06/2017   Procedure: RIGHT TOTAL KNEE ARTHROPLASTY;  Surgeon: Melodi Lerner, MD;  Location: [facility];  Service: Orthopedics;  Laterality: Right;   TRANSURETHRAL RESECTION OF BLADDER TUMOR [name] 10/19/2021   Procedure: TRANSURETHRAL RESECTION OF BLADDER TUMOR (TURBT);  Surgeon: Twylla Glendia BROCKS, MD;  Location: [facility];  Service: Urology;  Laterality: [name];   TRANSURETHRAL RESECTION OF BLADDER TUMOR [name] 11/15/2022   Procedure: TRANSURETHRAL RESECTION OF BLADDER TUMOR (TURBT);  Surgeon: Twylla Glendia BROCKS, MD;  Location: [facility];  Service: Urology;  Laterality: [name];   Patient Active Problem List   Diagnosis Date Noted   HFrEF (heart failure with reduced ejection fraction) (HCC) 01/05/2024   Urinary incontinence 01/05/2024   Rotator cuff tear arthropathy of right shoulder 09/08/2023   Sinus headache 08/02/2023   Bradycardia 08/02/2023   Abnormal stress  test 01/14/2023   Angina pectoris (HCC) 01/13/2023   Retinal artery occlusion, branch, 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   Cervical spondylosis with radiculopathy 07/11/2022   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   OSA (obstructive sleep apnea)    Chronic heart failure with preserved ejection fraction (HFpEF) (HCC)    Nocturnal hypoxia 06/11/2021   CKD (chronic kidney disease) stage 3, GFR 30-59 ml/min (HCC) 02/24/2021   Peripheral neuropathy 02/23/2021   Spondylolisthesis of lumbar region 07/07/2020   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   Coccydynia 09/20/2019   Nasal sinus congestion 07/19/2019   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   Anxiety 06/04/2019   Chronic right shoulder pain 09/10/2018   Cervical stenosis of spinal canal 06/05/2017   Peripheral edema 06/05/2017   Health maintenance examination 12/06/2016   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/13/2016   Overweight (BMI 25.0-29.9) 07/04/2016   Low vitamin B12 level 01/01/2016   Exertional dyspnea 07/07/2015   Unstable angina (HCC)    Medicare annual wellness visit, subsequent 07/03/2015   Advanced care planning/counseling discussion 07/03/2015   Type 2 diabetes mellitus with other specified complication (HCC) 05/04/2015   Pleuritic chest pain 05/04/2015   Ex-smoker 05/04/2015   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   Spermatocele 04/02/2013   Syncopal vertigo 11/04/2010   Lumbar disc disease with radiculopathy 11/04/2010   Serous otitis media 06/07/2010   Coronary artery disease involving native coronary artery of native heart with unstable angina pectoris (HCC) 08/11/2009   PAD (peripheral artery disease) (HCC) 08/11/2009   DUPUYTREN'S CONTRACTURE, 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   Carotid stenosis 07/09/2008   Chronic prostatitis 05/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   Benign prostatic hyperplasia with urinary obstruction 09/06/2007   Essential hypertension 04/30/2007   GERD 04/30/2007   Hyperlipidemia associated with  type 2 diabetes mellitus (HCC) 04/26/2007   OA (osteoarthritis) of knee 04/26/2007   DVT, HX OF 04/26/2007    PCP: Rilla Baller, MD REFERRING PROVIDER: Franky Pointer, MD (ortho)  REFERRING DIAG: s/p Rt rTSA  THERAPY DIAG:  Stiffness of right shoulder, not elsewhere classified Rationale for Evaluation and Treatment: Rehabilitation ONSET DATE:   SUBJECTIVE:  SUBJECTIVE STATEMENT: Pt states that he has had ongoing right shoulder pain from when he had door jerk is arm and this is located on the lateral side of the right shoulder.  He is also dealing with ongoing hamstring pain in left hip.    Hand dominance: right  PERTINENT HISTORY: Reverse TSA wiith Dr Melita 2nd week of May 2025. Pt wore sling x 2 weeks, then DC. Pt repors use of polar care, also says he's been moving his arm around a lot and its doing well. Pain controlled. Pt not aware of anti extension, anti IR precautions, but reports he would never do these anyway. Pt just DC from hospital 5 days prior to evaluation here after cardiac chest pain problem, underwent left radial cath, was on heaprin x3 days, now back home.  PAIN:  Are you having pain? No PRECAUTIONS: No shoulder extension, no IR+ADD shoulder right  WEIGHT BEARING RESTRICTIONS: No lifting, no body weight bearing  FALLS:  Has patient fallen in last 6 months? No PATIENT GOALS: improved arm function   NEXT MD VISIT: 2nd week June 2025  OBJECTIVE:  Note: Objective measures were completed at Evaluation unless otherwise noted.  PATIENT SURVEYS :  QUIcK DASH: 70.5% (01/25/24)   UPPER EXTREMITY ROM:    A/PROM Right eval Left  7/10  Right  02/27/24 Right  02/29/24 Right  03/05/24 Right  03/12/24  Shoulder flexion 90 degrees  160 deg   130 deg  140 deg  130/ NT  135/145*  Shoulder  extension prohibited       Shoulder abduction 45 degrees 120 deg  115 deg   120 deg 115/ NT 125/130*  Shoulder internal rotation at 90 deg abd  Full   60 deg 80 deg    Shoulder external rotation at 90 deg abd -20 degrees  18 deg 40 deg   20/NT    Shoulder ER at 0 deg   50  30    60/NT   Shoulder IR at 0 deg   NT       Elbow flexion Full        Elbow extension Full        (Blank rows = not tested)                                         SHOULDER MMT      MMT Right 03/05/24 Left  03/05/24  Left  03/12/24 Right  03/12/24  Shoulder flexion 4    4+ 4  Shoulder extension      Shoulder abduction 4  4+ 4  Shoulder internal rotation at 90 deg abd    4 4-*  Shoulder external rotation at 90 deg abd   4 4  Shoulder ER at 0 deg    4 3+*  Shoulder IR at 0 deg    4 4  Elbow flexion       Elbow extension      (Blank rows = not tested)                                   PALPATION:  Posterior deltoid on right shoulder TTP  TODAY'S TREATMENT   03/12/24: All single UE exercises on RUE   THEREX  UBE with seat at 8 2.5 min forward and 2.5 min backward  Shoulder ROM and MMT (See Above)  Shoulder IR/ER AAROM at 0 deg with wand 2 x 10   Shoulder Flexion (Scaption)  AROM 120 deg  1x 5  -Pt still shows shoulder shrug relative to left side    Isometric Heel Curl Isometric 3 sec hold 2 x 10   Lower Trap Wall Setting  -Pt unable to perform due to decreased shoulder mobility   Bent Over Y's 2 x 10  OMEGA Single UE Row #10 1 x 10   Single UE Row with red band 1 x 10   QuickDash: 43% (43/100)   PATIENT EDUCATION: Education details: See above  Person educated: Runner, broadcasting/film/video: collaborative learning, deliberate practice, positive reinforcement, explicit instruction, establish rules. Education comprehension: fair   HOME EXERCISE PROGRAM: Access Code: [email or web address] URL: [email or web address] Date: 03/12/2024 Prepared by: Toribio Servant  Exercises - Seated Shoulder  Flexion AAROM with Pulley Behind  - 1 x daily - 7 x weekly - 3 sets - 10 reps - Seated Shoulder Abduction AAROM with Pulley Behind  - 1 x daily - 7 x weekly - 3 sets - 10 reps - Seated Upper Trapezius Stretch  - 1 x daily - 7 x weekly - 3 reps - 60 sec  hold - Supine Chest Stretch on Foam Roll  - 1 x daily - 7 x weekly - 3 reps - 60 sec hold - Seated Overhead Shoulder External Rotation Stretch with Towel  - 1 x daily - 7 x weekly - 3 reps - 30-60 sec hold - Seated AAROM Shoulder External Rotation at 90 Degrees with Counter Support Using Cane  - 1 x daily - 7 x weekly - 3 sets - 10 reps - Standing Shoulder External Rotation AAROM with Dowel  - 1 x daily - 7 x weekly - 3 sets - 10 reps - Shoulder Flexion and Extension with Coordinated Breathing  - 3-4 x weekly - 3 sets - 10 reps - Standing Shoulder Scaption  - 3-4 x weekly - 3 sets - 10 reps - Shoulder Bent over Y's   - 3-4 x weekly - 3 sets - 10 reps - Standing Single Arm Low Row with Anchored Resistance  - 3-4 x weekly - 3 sets - 10 reps - Seated Hamstring Set  - 1 x daily - 7 x weekly - 2 sets - 10 reps - 3 sec hold   ASSESSMENT:  CLINICAL IMPRESSION: Pt is now 10 weeks post op right TSA. He shows improved right shoulder strength and maintenance of improved right shoulder mobility. He continues to experience increased right shoulder pain especially when reaching overhead that was recently flared up when opening up door. Pain appears to be most pronounced with shoulder ER at 90 degrees indicating infraspinatus involvement. He is now met most of his goals with the exception of shoulder strength. He will continue to benefit from skilled PT intervention to improve ROM and strength in RUE in order to restore to PLOF in ADL, IADL, and leisure activity.     OBJECTIVE IMPAIRMENTS: decreased activity tolerance, decreased knowledge of condition, decreased knowledge of use of DME, decreased mobility, decreased ROM, and decreased strength.  ACTIVITY  LIMITATIONS: carrying, lifting, sleeping, bathing, toileting, dressing, and reach over head PARTICIPATION LIMITATIONS: meal prep, cleaning, laundry, medication management, interpersonal relationship, driving, shopping, and community activity PERSONAL FACTORS: Age, Behavior pattern, Education, Fitness, Past/current experiences, Profession, and Social background are also affecting patient's functional outcome.  REHAB POTENTIAL: Fair   CLINICAL DECISION MAKING: Medium  EVALUATION COMPLEXITY: Moderate  GOALS: Goals reviewed with patient? No  SHORT TERM GOALS: Target date: 02/22/24  Patient will report comprehension, confidence, and consistent compliance and of a simple home exercise program established to facilitate symptoms management and basic strengthening and/or segment mobility.   Baseline: issued at visit 1 and 2  Goal status: ACHIEVED   2.  Patient to demonstrate improved score on self-report measure by >9% to indicate reduced self-reported disability and/or pain.    Baseline:  Quick DASH 70.5%  02/27/24: 48%   Goal status: ACHIEVED    LONG TERM GOALS: Target date: 03/19/24  Patient to improve score on self-report measure by 20% or greater to indicate reduced disability and improved quality of life.   Baseline: Bertrand 70.5% 7/115/25: 48% Goal status: ACHIEVED    2.  Patient to demonstrate improvement in strength testing by minimum of 1 grade to indicate improved motor performance.   Baseline: 3/5 or less all groups 03/12/24: Shoulder Flex R/L 4/4, Shoulder Abd R/L 4/4, Shoulder ER at 90 deg R/L 4/4, Shoulder IR at 90 deg R/L 4-*/4,  Shoulder ER at 0 deg Abd 3+*/4, Shoulder IR at 0 deg Abd 4-/4       Goal status: Progressing   3.  Patient to demonstrate improvement in RUE shoulder A/ROM without pain limitations >100 degrees flexion, 75 degrees ABDCT. Baseline: Shoulder Flex R/L 120/180, Shoulder Abd R/L 80/NT, Shoulder ER at 60 deg abd R/L 15/ NT 02/22/24 Shoulder Flex 160, Abd 95  Goal  status: ACHIEVED   PLAN: PT FREQUENCY: 1-2x/week PT DURATION: 8 weeks PLANNED INTERVENTIONS: 97750- Physical Performance Testing, 97110-Therapeutic exercises, 97530- Therapeutic activity, [id number]- Neuromuscular re-education, 97535- Self Care, [id number]- Manual therapy, [id number]- Electrical stimulation (unattended), 02967- Electrical stimulation (manual), Patient/Family education, Cryotherapy, and Moist heat  PLAN FOR NEXT SESSION: Continue to progress shoulder mobility and neuromuscular control with scapular strengthening to decrease shoulder shrug. Serratus push ups.     Toribio Servant PT, DPT  [facility] [address], [place], [address] Phone: [phone]   Fax:  [phone]

## 2024-03-13 ENCOUNTER — Encounter: Admitting: Physical Therapy

## 2024-03-13 ENCOUNTER — Ambulatory Visit: Admitting: Physical Therapy

## 2024-03-13 DIAGNOSIS — M545 Low back pain, unspecified: Secondary | ICD-10-CM | POA: Diagnosis not present

## 2024-03-13 DIAGNOSIS — S76311A Strain of muscle, fascia and tendon of the posterior muscle group at thigh level, right thigh, initial encounter: Secondary | ICD-10-CM | POA: Diagnosis not present

## 2024-03-13 DIAGNOSIS — S76312A Strain of muscle, fascia and tendon of the posterior muscle group at thigh level, left thigh, initial encounter: Secondary | ICD-10-CM | POA: Diagnosis not present

## 2024-03-14 ENCOUNTER — Ambulatory Visit: Admitting: Physical Therapy

## 2024-03-15 ENCOUNTER — Inpatient Hospital Stay

## 2024-03-15 ENCOUNTER — Ambulatory Visit

## 2024-03-15 VITALS — BP 125/61 | HR 70 | Temp 96.8°F | Resp 17 | Wt 210.4 lb

## 2024-03-15 DIAGNOSIS — Z87891 Personal history of nicotine dependence: Secondary | ICD-10-CM | POA: Insufficient documentation

## 2024-03-15 DIAGNOSIS — C679 Malignant neoplasm of bladder, unspecified: Secondary | ICD-10-CM

## 2024-03-15 DIAGNOSIS — D649 Anemia, unspecified: Secondary | ICD-10-CM | POA: Insufficient documentation

## 2024-03-15 DIAGNOSIS — N1831 Chronic kidney disease, stage 3a: Secondary | ICD-10-CM

## 2024-03-15 DIAGNOSIS — Z79899 Other long term (current) drug therapy: Secondary | ICD-10-CM | POA: Diagnosis not present

## 2024-03-15 DIAGNOSIS — R8290 Unspecified abnormal findings in urine: Secondary | ICD-10-CM | POA: Diagnosis not present

## 2024-03-15 DIAGNOSIS — N1832 Chronic kidney disease, stage 3b: Secondary | ICD-10-CM | POA: Diagnosis not present

## 2024-03-15 DIAGNOSIS — Z5111 Encounter for antineoplastic chemotherapy: Secondary | ICD-10-CM | POA: Diagnosis not present

## 2024-03-15 DIAGNOSIS — Z7982 Long term (current) use of aspirin: Secondary | ICD-10-CM | POA: Insufficient documentation

## 2024-03-15 LAB — CMP (CANCER CENTER ONLY)
ALT: 10 U/L (ref 0–44)
AST: 13 U/L — ABNORMAL LOW (ref 15–41)
Albumin: 4.1 g/dL (ref 3.5–5.0)
Alkaline Phosphatase: 27 U/L — ABNORMAL LOW (ref 38–126)
Anion gap: 5 (ref 5–15)
BUN: 34 mg/dL — ABNORMAL HIGH (ref 8–23)
CO2: 29 mmol/L (ref 22–32)
Calcium: 8.9 mg/dL (ref 8.9–10.3)
Chloride: 107 mmol/L (ref 98–111)
Creatinine: 1.59 mg/dL — ABNORMAL HIGH (ref 0.61–1.24)
GFR, Estimated: 43 mL/min — ABNORMAL LOW (ref >60)
Glucose, Bld: 109 mg/dL — ABNORMAL HIGH (ref 70–99)
Potassium: 4.4 mmol/L (ref 3.5–5.1)
Sodium: 141 mmol/L (ref 135–145)
Total Bilirubin: 0.6 mg/dL (ref 0.0–1.2)
Total Protein: 6.2 g/dL — ABNORMAL LOW (ref 6.5–8.1)

## 2024-03-15 LAB — URINALYSIS, COMPLETE (UACMP) WITH MICROSCOPIC
Bacteria, UA: NONE SEEN
Bilirubin Urine: NEGATIVE
Glucose, UA: NEGATIVE mg/dL
Hgb urine dipstick: NEGATIVE
Ketones, ur: NEGATIVE mg/dL
Leukocytes,Ua: NEGATIVE
Nitrite: POSITIVE — AB
Protein, ur: NEGATIVE mg/dL
Specific Gravity, Urine: 1.018 (ref 1.005–1.030)
pH: 5 (ref 5.0–8.0)

## 2024-03-15 LAB — CBC WITH DIFFERENTIAL (CANCER CENTER ONLY)
Abs Immature Granulocytes: 0.01 K/uL (ref 0.00–0.07)
Basophils Absolute: 0.1 K/uL (ref 0.0–0.1)
Basophils Relative: 1 %
Eosinophils Absolute: 0.2 K/uL (ref 0.0–0.5)
Eosinophils Relative: 4 %
HCT: 36.6 % — ABNORMAL LOW (ref 39.0–52.0)
Hemoglobin: 12.2 g/dL — ABNORMAL LOW (ref 13.0–17.0)
Immature Granulocytes: 0 %
Lymphocytes Relative: 22 %
Lymphs Abs: 1.1 K/uL (ref 0.7–4.0)
MCH: 31.3 pg (ref 26.0–34.0)
MCHC: 33.3 g/dL (ref 30.0–36.0)
MCV: 93.8 fL (ref 80.0–100.0)
Monocytes Absolute: 0.5 K/uL (ref 0.1–1.0)
Monocytes Relative: 11 %
Neutro Abs: 3.2 K/uL (ref 1.7–7.7)
Neutrophils Relative %: 62 %
Platelet Count: 153 K/uL (ref 150–400)
RBC: 3.9 MIL/uL — ABNORMAL LOW (ref 4.22–5.81)
RDW: 12.7 % (ref 11.5–15.5)
WBC Count: 5.1 K/uL (ref 4.0–10.5)
nRBC: 0 % (ref 0.0–0.2)

## 2024-03-15 MED ORDER — PROCHLORPERAZINE MALEATE 10 MG PO TABS
10.0000 mg | ORAL_TABLET | Freq: Once | ORAL | Status: AC
Start: 2024-03-15 — End: 2024-03-15
  Administered 2024-03-15: 10 mg via ORAL

## 2024-03-15 MED ORDER — SODIUM CHLORIDE (PF) 0.9 % IJ SOLN
37.5000 mg | Freq: Once | INTRAVENOUS | Status: AC
  Administered 2024-03-15: 37.5 mg via INTRAVESICAL
  Filled 2024-03-15: qty 3.75

## 2024-03-15 MED ORDER — SODIUM CHLORIDE (PF) 0.9 % IJ SOLN
1000.0000 mg | Freq: Once | INTRAVENOUS | Status: AC
  Administered 2024-03-15: 1000 mg via INTRAVESICAL
  Filled 2024-03-15: qty 26.3

## 2024-03-15 MED ORDER — OXYBUTYNIN CHLORIDE 5 MG PO TABS
5.0000 mg | ORAL_TABLET | Freq: Once | ORAL | Status: AC
  Administered 2024-03-15: 5 mg via ORAL
  Filled 2024-03-15: qty 1

## 2024-03-15 MED ORDER — LIDOCAINE HCL URETHRAL/MUCOSAL 2 % EX GEL
1.0000 | Freq: Once | CUTANEOUS | Status: DC
  Filled 2024-03-15: qty 1

## 2024-03-15 NOTE — Progress Notes (Signed)
 Oxybutynin 5mg  added as premed per Dr. Tina.  Adrionna Delcid, PharmD, MBA

## 2024-03-15 NOTE — Patient Instructions (Signed)
 [facility] - A DEPT OF [facility]  Discharge Instructions: Thank you for choosing [facility] to provide your oncology and hematology care.   If you have a lab appointment with the Cancer Center, please go directly to the Cancer Center and check in at the registration area.   Wear comfortable clothing and clothing appropriate for easy access to any Portacath or PICC line.   We strive to give you quality time with your provider. You may need to reschedule your appointment if you arrive late (15 or more minutes).  Arriving late affects you and other patients whose appointments are after yours.  Also, if you miss three or more appointments without notifying the office, you may be dismissed from the clinic at the provider's discretion.      For prescription refill requests, have your pharmacy contact our office and allow 72 hours for refills to be completed.    Today you received the following chemotherapy and/or immunotherapy agents Gemzar , Docetaxel    To help prevent nausea and vomiting after your treatment, we encourage you to take your nausea medication as directed.  BELOW ARE SYMPTOMS THAT SHOULD BE REPORTED IMMEDIATELY: *FEVER GREATER THAN 100.4 F (38 C) OR HIGHER *CHILLS OR SWEATING *NAUSEA AND VOMITING THAT IS NOT CONTROLLED WITH YOUR NAUSEA MEDICATION *UNUSUAL SHORTNESS OF BREATH *UNUSUAL BRUISING OR BLEEDING *URINARY PROBLEMS (pain or burning when urinating, or frequent urination) *BOWEL PROBLEMS (unusual diarrhea, constipation, pain near the anus) TENDERNESS IN MOUTH AND THROAT WITH OR WITHOUT PRESENCE OF ULCERS (sore throat, sores in mouth, or a toothache) UNUSUAL RASH, SWELLING OR PAIN  UNUSUAL VAGINAL DISCHARGE OR ITCHING   Items with * indicate a potential emergency and should be followed up as soon as possible or go to the Emergency Department if any problems should occur.  Please show the CHEMOTHERAPY ALERT CARD or  IMMUNOTHERAPY ALERT CARD at check-in to the Emergency Department and triage nurse.  Should you have questions after your visit or need to cancel or reschedule your appointment, please contact [facility] - A DEPT OF [facility]  Dept: [phone]  and follow the prompts.  Office hours are 8:00 a.m. to 4:30 p.m. Monday - Friday. Please note that voicemails left after 4:00 p.m. may not be returned until the following business day.  We are closed weekends and major holidays. You have access to a nurse at all times for urgent questions. Please call the main number to the clinic Dept: [phone] and follow the prompts.   For any non-urgent questions, you may also contact your provider using MyChart. We now offer e-Visits for anyone 82 and older to request care online for non-urgent symptoms. For details visit [email or web address].   Also download the MyChart app! Go to the app store, search MyChart, open the app, select [facility], and log in with your MyChart username and password.

## 2024-03-15 NOTE — Assessment & Plan Note (Addendum)
 Discussed increase fluid after each treatment. Fluid goal 64 oz per day

## 2024-03-15 NOTE — Addendum Note (Signed)
 Addended by: TINA HUDSON on: 03/15/2024 10:27 AM   Modules accepted: Orders

## 2024-03-16 ENCOUNTER — Other Ambulatory Visit: Payer: Self-pay | Admitting: Urology

## 2024-03-18 ENCOUNTER — Telehealth: Payer: Self-pay

## 2024-03-18 ENCOUNTER — Other Ambulatory Visit: Payer: Self-pay

## 2024-03-18 ENCOUNTER — Encounter: Payer: Self-pay | Admitting: Family Medicine

## 2024-03-18 DIAGNOSIS — N1831 Chronic kidney disease, stage 3a: Secondary | ICD-10-CM

## 2024-03-18 DIAGNOSIS — M461 Sacroiliitis, not elsewhere classified: Secondary | ICD-10-CM | POA: Diagnosis not present

## 2024-03-18 DIAGNOSIS — R7989 Other specified abnormal findings of blood chemistry: Secondary | ICD-10-CM

## 2024-03-18 DIAGNOSIS — E1169 Type 2 diabetes mellitus with other specified complication: Secondary | ICD-10-CM

## 2024-03-18 NOTE — Telephone Encounter (Signed)
-----   Message from Nurse Selinda E sent at 03/15/2024  1:34 PM EDT ----- Regarding: first time bladder chemo- Chang- Gemzar / Taxotere  intravesticular Patient received treatment for the first time today. He is seen by Dr. Tina. He tolerated treatment well with no issues. No Cramping or spasms :)

## 2024-03-18 NOTE — Telephone Encounter (Signed)
 Called patient to see how he did over the weekend with his treatment and he is doing extremely well with no issues. He did advise me that he is supposed to get labs on Friday at [facility] for his PCP. Since he is also coming to the [facility], I reached out to his PCP to see if we can cancel his lab visit here and do all his labs at the Cancer Center to save him an additional trip. I advised him I would call him back to let him know if we could arrange this. Andrea CHRISTELLA Plunk, RN

## 2024-03-19 ENCOUNTER — Encounter: Payer: Self-pay | Admitting: Physician Assistant

## 2024-03-19 ENCOUNTER — Other Ambulatory Visit: Payer: Self-pay

## 2024-03-19 DIAGNOSIS — N1831 Chronic kidney disease, stage 3a: Secondary | ICD-10-CM

## 2024-03-19 DIAGNOSIS — C679 Malignant neoplasm of bladder, unspecified: Secondary | ICD-10-CM

## 2024-03-20 ENCOUNTER — Ambulatory Visit: Attending: Orthopedic Surgery | Admitting: Physical Therapy

## 2024-03-20 ENCOUNTER — Telehealth: Payer: Self-pay

## 2024-03-20 DIAGNOSIS — Z471 Aftercare following joint replacement surgery: Secondary | ICD-10-CM | POA: Diagnosis not present

## 2024-03-20 DIAGNOSIS — M25611 Stiffness of right shoulder, not elsewhere classified: Secondary | ICD-10-CM | POA: Insufficient documentation

## 2024-03-20 NOTE — Therapy (Signed)
 OUTPATIENT PHYSICAL THERAPY DISCHARGE   Patient Name: [name] MRN: [id number] DOB:11-Jun-1941, 83 y.o., male Today's Date: 03/20/2024  END OF SESSION:    03/20/24 1656  PT Visits / Re-Eval  Visit Number 11  Number of Visits 16  Date for PT Re-Evaluation 05/19/24  Authorization  Authorization Type HTA 2025  Authorization - Visit Number 11  Authorization - Number of Visits 20  Progress Note Due on Visit 20  PT Time Calculation  PT Start Time 1645  PT Stop Time 1730  PT Time Calculation (min) 45 min  PT - End of Session  Activity Tolerance Patient tolerated treatment well  Behavior During Therapy WFL for tasks assessed/performed    Past Medical History:  Diagnosis Date   Actinic keratosis    Allergy    Anginal pain (HCC)    Anxiety    Aortic ectasia, abdominal (HCC)    a.) CT abd 01/01/2022: 2.9 cm infrarenal abdominal aorta   Aortic stenosis    Asthma    Bladder cancer (HCC) 2023   BPH (benign prostatic hyperplasia) 09/06/2007   CAD s/p CABG    a.) 1990 s/p MI--> CABG x 3; b.) LHC/PCI 2002 --> BMS (unk type) to LCx   Carotid arterial disease (HCC)    a.) 08/2016 Carotid U/S: <39% bilat; b.) carotid doppler 10/12/2022: mild-to-moderate (<50%) bilateral bifurcation plaque.   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 06/2019 Echo (in setting of COVID): EF 35-40%, mild LVH, g1 DD, glob HK; b. 09/2019 Echo: EF 50-55%, Gr1 DD; c. 03/2021 Echo: EF 50-55%, no rwma, mild LVH, GrI DD, nl RV fxn. RVSP 44.9mmHg. Mild-mod dil LA. Triv MR. Mild-mod Ao sclerosis.   History of 2019 novel coronavirus disease (COVID-19) 05/20/2019   History of hiatal hernia    History of shingles    HLD (hyperlipidemia) 04/26/2007   HTN (hypertension) 04/30/2007   LBBB (left bundle branch block)    Long-term use of aspirin  therapy    Lumbar disc disease with radiculopathy    Migraines    Myocardial infarction (HCC) 1990   a.) resulted in 3v CABG   Myocardial infarction (HCC) 2002   a.) second cardiac event; PCI with placement of BMS (unknown type) to LCx   Neuropathy of both feet    OSA on CPAP    Osteoarthritis    Oxygen  deficiency    PAD (peripheral artery disease) (HCC)    a. 07/2017 LE duplex: RSFA 75-68m, LSFA 75-54m, 50-74d; c. 08/2018 Periph Angio: No signif AoIliac dzs. Mod-sev Ca2+ RSFA w/ diff dzs throughout- 3 vessel runoff. Borderline signif LSFA dzs w/ mod-sev Ca2+ vessels and 3 vessel runoff below the knee-->Med rx.   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   Right mid dorsum forearm - in situ - EDC   Stroke (HCC)    T2DM (type 2 diabetes mellitus) (HCC)    Traumatic open wound of lower leg with delayed healing, left 12/28/2022   Past Surgical History:  Procedure Laterality Date   ABDOMINAL AORTOGRAM W/LOWER EXTREMITY [name] 09/12/2018   Procedure: ABDOMINAL AORTOGRAM W/LOWER EXTREMITY;  Surgeon: Darron Deatrice LABOR, MD;  [facility];  Service: Cardiovascular;  Laterality: [name];   ANTERIOR  CERVICAL DECOMP/DISCECTOMY FUSION [name] 07/11/2022   Anterior Cervical Decompression Fusion ,Interboy Prothesis,Plate/Screws , Cervical four-five,Cervical five-six Gavin, Reyes, MD)   APPENDECTOMY     rupture   BACK SURGERY     1984 and then another one [facility]   BLADDER INSTILLATION [name] 10/19/2021   Procedure: BLADDER INSTILLATION OF GEMCITABINE ;  Surgeon: Twylla Glendia BROCKS, MD;  Location: [facility];  Service: Urology;  Laterality: [name];   BLADDER INSTILLATION [name] 11/15/2022   Procedure: BLADDER INSTILLATION OF GEMCITABINE ;  Surgeon: Twylla Glendia BROCKS, MD;  Location: [facility];  Service: Urology;  Laterality: [name];   CARDIAC CATHETERIZATION [name] 07/07/2015   Procedure: Left Heart Cath and Cors/Grafts Angiography;  Surgeon: Evalene JINNY Lunger, MD;  Location: [facility];  Service: Cardiovascular;  Laterality: [name];   CATARACT EXTRACTION, BILATERAL  2009   CERVICAL SPINE SURGERY  12/2016   cervical stenosis Gavin)   COLONOSCOPY  03/2010   HP polyp, diverticulosis, rpt 10 yrs ([name])   CORONARY ANGIOPLASTY  11/30/2000   LCX   CORONARY ARTERY BYPASS GRAFT  1990   3 vessel    CORONARY STENT INTERVENTION [name] 01/13/2023   Procedure: CORONARY STENT INTERVENTION;  Surgeon: Darron Deatrice LABOR, MD;  Location: [facility];  Service: Cardiovascular;  Laterality: [name];   CYSTOSCOPY  12/23/2010   [facility]   CYSTOSCOPY WITH BIOPSY [name] 01/16/2024   Procedure: CYSTOSCOPY, WITH BIOPSY;  Surgeon: Twylla Glendia BROCKS, MD;  Location: [facility];  Service: Urology;  Laterality: [name];   EYE SURGERY     JOINT REPLACEMENT     KNEE ARTHROSCOPY Right    x2   LAMINOTOMY  1986   L5/S1 lumbar laminotomy for two ruptured discs/fusion   LEFT HEART CATH AND CORS/GRAFTS ANGIOGRAPHY [name] 01/19/2024   Procedure: LEFT HEART CATH AND CORS/GRAFTS ANGIOGRAPHY;  Surgeon: Mady Bruckner, MD;  Location: [facility];  Service: Cardiovascular;  Laterality: [name];   LUMBAR LAMINECTOMY/DECOMPRESSION MICRODISCECTOMY  [name] 07/13/2016   Procedure: LUMBAR TWO-THREE, LUMBAR THREE-FOUR, LUMBAR FOUR-FIVE LAMINECTOMY AND FORAMINOTOMY;  Surgeon: Reyes Budge, MD;  Location: [facility];  Service: Neurosurgery;  Laterality: [name];  LAMINECTOMY AND FORAMINOTOMY L2-L3, L3-L4,L4-L5   LUMBAR SPINE SURGERY  06/2020   Bilateral redo laminectomy/laminotomy/foraminotomies/medial facetectomy to decompress the bilateral L4 and L5 nerve roots Gavin)   REVERSE SHOULDER ARTHROPLASTY Right 12/21/2023   Procedure: ARTHROPLASTY, SHOULDER, TOTAL, REVERSE;  Surgeon: Melita Drivers, MD;  Location: [facility];  Service: Orthopedics;  Laterality: Right;   RIGHT/LEFT HEART CATH AND CORONARY ANGIOGRAPHY Bilateral 01/13/2023   Procedure: RIGHT/LEFT HEART CATH AND CORONARY ANGIOGRAPHY;  Surgeon: Darron Deatrice LABOR, MD;  Location: [facility];  Service: Cardiovascular;  Laterality: Bilateral;   SPINE SURGERY  TOTAL HIP ARTHROPLASTY Bilateral 1999,2000   TOTAL KNEE ARTHROPLASTY Right 03/06/2017   Procedure: RIGHT TOTAL KNEE ARTHROPLASTY;  Surgeon: Melodi Lerner, MD;  Location: [facility];  Service: Orthopedics;  Laterality: Right;   TRANSURETHRAL RESECTION OF BLADDER TUMOR [name] 10/19/2021   Procedure: TRANSURETHRAL RESECTION OF BLADDER TUMOR (TURBT);  Surgeon: Twylla Glendia BROCKS, MD;  Location: [facility];  Service: Urology;  Laterality: [name];   TRANSURETHRAL RESECTION OF BLADDER TUMOR [name] 11/15/2022   Procedure: TRANSURETHRAL RESECTION OF BLADDER TUMOR (TURBT);  Surgeon: Twylla Glendia BROCKS, MD;  Location: [facility];  Service: Urology;  Laterality: [name];   Patient Active Problem List   Diagnosis Date Noted   HFrEF (heart failure with reduced ejection fraction) (HCC) 01/05/2024   Urinary incontinence 01/05/2024   Rotator cuff tear arthropathy of right shoulder 09/08/2023   Sinus headache 08/02/2023   Bradycardia 08/02/2023   Abnormal stress test 01/14/2023   Angina pectoris (HCC) 01/13/2023   Retinal artery occlusion, branch, 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    Cervical spondylosis with radiculopathy 07/11/2022   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   OSA (obstructive sleep apnea)    Chronic heart failure with preserved ejection fraction (HFpEF) (HCC)    Nocturnal hypoxia 06/11/2021   CKD (chronic kidney disease) stage 3, GFR 30-59 ml/min (HCC) 02/24/2021   Peripheral neuropathy 02/23/2021   Spondylolisthesis of lumbar region 07/07/2020   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   Coccydynia 09/20/2019   Nasal sinus congestion 07/19/2019   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   Anxiety 06/04/2019   Chronic right shoulder pain 09/10/2018   Cervical stenosis of spinal canal 06/05/2017   Peripheral edema 06/05/2017   Health maintenance examination 12/06/2016   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/13/2016   Overweight (BMI 25.0-29.9) 07/04/2016   Low vitamin B12 level 01/01/2016   Exertional dyspnea 07/07/2015   Unstable angina (HCC)    Medicare annual wellness visit, subsequent 07/03/2015   Advanced care planning/counseling discussion 07/03/2015   Type 2 diabetes mellitus with other specified complication (HCC) 05/04/2015   Pleuritic chest pain 05/04/2015   Ex-smoker 05/04/2015   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   Spermatocele 04/02/2013   Syncopal vertigo 11/04/2010   Lumbar disc disease with radiculopathy 11/04/2010   Serous otitis media 06/07/2010   Coronary artery disease involving native coronary artery of native heart with unstable angina pectoris (HCC) 08/11/2009   PAD (peripheral artery disease) (HCC) 08/11/2009   DUPUYTREN'S CONTRACTURE, 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   Carotid stenosis 07/09/2008   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   Benign prostatic hyperplasia with urinary obstruction 09/06/2007   Essential hypertension 04/30/2007   GERD 04/30/2007   Hyperlipidemia associated with type 2 diabetes mellitus (HCC) 04/26/2007   OA (osteoarthritis) of knee 04/26/2007   DVT, HX OF 04/26/2007     PCP: Rilla Baller, MD REFERRING PROVIDER: Franky Pointer, MD (ortho)  REFERRING DIAG: s/p Rt rTSA  THERAPY DIAG:  Stiffness of right shoulder, not elsewhere classified Rationale for Evaluation and Treatment: Rehabilitation ONSET DATE:   SUBJECTIVE:  SUBJECTIVE STATEMENT: Pt reports feeling very tired today because he has been traveling around for a number of doctor's appointments. He is now feeling less pain in lateral tendon of right shoulder and X-ray from orthopedist showed no fractures or damage to right shoulder.    Hand dominance: right  PERTINENT HISTORY: Reverse TSA wiith Dr Melita 2nd week of May 2025. Pt wore sling x 2 weeks, then DC. Pt repors use of polar care, also says he's been moving his arm around a lot and its doing well. Pain controlled. Pt not aware of anti extension, anti IR precautions, but reports he would never do these anyway. Pt just DC from hospital 5 days prior to evaluation here after cardiac chest pain problem, underwent left radial cath, was on heaprin x3 days, now back home.  PAIN:  Are you having pain? No PRECAUTIONS: No shoulder extension, no IR+ADD shoulder right  WEIGHT BEARING RESTRICTIONS: No lifting, no body weight bearing  FALLS:  Has patient fallen in last 6 months? No PATIENT GOALS: improved arm function   NEXT MD VISIT: 2nd week June 2025  OBJECTIVE:  Note: Objective measures were completed at Evaluation unless otherwise noted.  PATIENT SURVEYS :  QUIcK DASH: 70.5% (01/25/24)   UPPER EXTREMITY ROM:    A/PROM Right eval Left  7/10  Right  02/27/24 Right  02/29/24 Right  03/05/24 Right  03/12/24  Shoulder flexion 90 degrees  160 deg   130 deg  140 deg  130/ NT  135/145*  Shoulder extension prohibited       Shoulder abduction 45 degrees 120 deg  115 deg    120 deg 115/ NT 125/130*  Shoulder internal rotation at 90 deg abd  Full   60 deg 80 deg    Shoulder external rotation at 90 deg abd -20 degrees  18 deg 40 deg   20/NT    Shoulder ER at 0 deg   50  30    60/NT   Shoulder IR at 0 deg   NT       Elbow flexion Full        Elbow extension Full        (Blank rows = not tested)                                         SHOULDER MMT      MMT Right 03/05/24 Left  03/05/24  Left  03/12/24 Right  03/12/24 Left   03/20/24 Right   03/20/24  Shoulder flexion 4    4+ 4 4+ 4+  Shoulder extension        Shoulder abduction 4  4+ 4 4+ 4+  Shoulder internal rotation at 90 deg abd    4 4-* 4 4+  Shoulder external rotation at 90 deg abd   4 4 4  4+  Shoulder ER at 0 deg    4 3+* 4 4  Shoulder IR at 0 deg    4 4 4 4   Elbow flexion         Elbow extension        (Blank rows = not tested)                                   PALPATION:  Posterior deltoid on right shoulder TTP  TODAY'S  TREATMENT   03/20/24: All single UE exercises on RUE   Shoulder Flexion/Extension AROM with ball rolls  1 x 10  Shoulder Abduction/Adduction in plane of scaption AROM ball rolls  1 x 10  Shoulder MMT (Listed Above) Shoulder Combined IR Stretch with strap 2 x 60 sec   Shoulder Combined ER Stretch with strap 2 X 60 sec    Seated Shoulder IR/ER at 0 deg abduction with red band  2 x 10   -Pt unable to perform while maintain 90 deg shoulder flexion  Standing Shoulder IR/ER at 0 deg abduction with red band 2 x 10   Shoulder flexion/extension AROM 1 x 10   Shoulder flexion/extension  with #1 DB  1 X 10   Shoulder Abduction/Adduction in scapular plane AROM 1 x 10   Shoulder Abduction/Adduction in scapular plane #1 DB 1 x 10   -Pt reports increased pain as he increased shoulder abduction  Seated Shoulder Horizontal Abduction AROM at 90 deg abduction  1 x 10    Reviewed progressions for remainder of exercises on patient's home exercise plan.   PATIENT  EDUCATION: Education details: See above  Person educated: Runner, broadcasting/film/video: collaborative learning, deliberate practice, positive reinforcement, explicit instruction, establish rules. Education comprehension: fair   HOME EXERCISE PROGRAM: Access Code: [email or web address] URL: [email or web address] Date: 03/20/2024 Prepared by: Toribio Servant  Exercises - Seated Shoulder Flexion AAROM with Pulley Behind  - 1 x daily - 7 x weekly - 3 sets - 10 reps - Seated Shoulder Abduction AAROM with Pulley Behind  - 1 x daily - 7 x weekly - 3 sets - 10 reps - Seated Upper Trapezius Stretch  - 1 x daily - 7 x weekly - 3 reps - 60 sec  hold - Standing Shoulder Internal Rotation Stretch with Towel  - 1 x daily - 7 x weekly - 3 reps - 60 sec  hold - Seated Overhead Shoulder External Rotation Stretch with Towel  - 1 x daily - 7 x weekly - 3 reps - 30-60 sec hold - Seated AAROM Shoulder External Rotation at 90 Degrees with Counter Support Using Cane  - 1 x daily - 7 x weekly - 3 sets - 10 reps - Shoulder Flexion and Extension with Coordinated Breathing  - 3-4 x weekly - 3 sets - 10 reps - Standing Shoulder Scaption  - 3-4 x weekly - 3 sets - 10 reps - Shoulder Bent over Y's   - 3-4 x weekly - 3 sets - 10 reps - Standing Single Arm Low Row with Anchored Resistance  - 3-4 x weekly - 3 sets - 10 reps - Horizontal Shoulder Abduction and Adduction with Coordinated Breathing  - 3-4 x weekly - 3 sets - 10 reps   ASSESSMENT:  CLINICAL IMPRESSION: Pt is now 11 weeks post op right TSA. Pt has now met all of his rehab goals with improvement in right shoulder strength.   He continues to experience increased right shoulder pain especially when reaching overhead that was recently flared up when opening up door. Pain appears to be most pronounced with shoulder ER at 90 degrees indicating infraspinatus involvement. He is now met most of his goals with the exception of shoulder strength. He will continue to benefit  from skilled PT intervention to improve ROM and strength in RUE in order to restore to PLOF in ADL, IADL, and leisure activity.     OBJECTIVE IMPAIRMENTS: decreased activity tolerance, decreased knowledge of condition, decreased knowledge of use  of DME, decreased mobility, decreased ROM, and decreased strength.  ACTIVITY LIMITATIONS: carrying, lifting, sleeping, bathing, toileting, dressing, and reach over head PARTICIPATION LIMITATIONS: meal prep, cleaning, laundry, medication management, interpersonal relationship, driving, shopping, and community activity PERSONAL FACTORS: Age, Behavior pattern, Education, Fitness, Past/current experiences, Profession, and Social background are also affecting patient's functional outcome.  REHAB POTENTIAL: Fair   CLINICAL DECISION MAKING: Medium  EVALUATION COMPLEXITY: Moderate  GOALS: Goals reviewed with patient? No  SHORT TERM GOALS: Target date: 02/22/24  Patient will report comprehension, confidence, and consistent compliance and of a simple home exercise program established to facilitate symptoms management and basic strengthening and/or segment mobility.   Baseline: issued at visit 1 and 2  Goal status: ACHIEVED   2.  Patient to demonstrate improved score on self-report measure by >9% to indicate reduced self-reported disability and/or pain.    Baseline:  Quick DASH 70.5%  02/27/24: 48%   Goal status: ACHIEVED    LONG TERM GOALS: Target date: 03/19/24  Patient to improve score on self-report measure by 20% or greater to indicate reduced disability and improved quality of life.   Baseline: Bertrand 70.5% 7/115/25: 48% Goal status: ACHIEVED    2.  Patient to demonstrate improvement in strength testing by minimum of 1 grade to indicate improved motor performance.   Baseline: 3/5 or less all groups 03/12/24: Shoulder Flex R/L 4/4, Shoulder Abd R/L 4/4, Shoulder ER at 90 deg R/L 4/4, Shoulder IR at 90 deg R/L 4-*/4,  Shoulder ER at 0 deg Abd 3+*/4,  Shoulder IR at 0 deg Abd 4-/4       Goal status: ACHIEVED    3.  Patient to demonstrate improvement in RUE shoulder A/ROM without pain limitations >100 degrees flexion, 75 degrees ABDCT. Baseline: Shoulder Flex R/L 120/180, Shoulder Abd R/L 80/NT, Shoulder ER at 60 deg abd R/L 15/ NT 02/22/24 Shoulder Flex 160, Abd 95  Goal status: ACHIEVED   PLAN: PT FREQUENCY: 1-2x/week PT DURATION: 8 weeks PLANNED INTERVENTIONS: 97750- Physical Performance Testing, 97110-Therapeutic exercises, 97530- Therapeutic activity, 97112- Neuromuscular re-education, 97535- Self Care, 02859- Manual therapy, G0283- Electrical stimulation (unattended), [phone]- Electrical stimulation (manual), Patient/Family education, Cryotherapy, and Moist heat  PLAN FOR NEXT SESSION: Discharge from PT    Toribio Servant PT, DPT  [facility] [address], [place], [address] Phone: [phone]   Fax:  [phone]

## 2024-03-20 NOTE — Telephone Encounter (Signed)
 [name] called with confusion about [name]'s labs at his PCP. Advised her that he does not need labs prior to his chemotherapy on Friday. He does need fasting labs at his PCP. Reinstated his lab appt at his PCP at 830 and cancelled our lab appt. Advised them to arrive at the cancer center at 1120 prior to their appt with Johnston Police, PA. She understands and will relay the message to him. Andrea CHRISTELLA Plunk, RN

## 2024-03-20 NOTE — Addendum Note (Signed)
 Addended by: THEOTIS TORIBIO PARAS on: 03/20/2024 05:44 PM   Modules accepted: Orders

## 2024-03-22 ENCOUNTER — Ambulatory Visit

## 2024-03-22 ENCOUNTER — Inpatient Hospital Stay

## 2024-03-22 ENCOUNTER — Other Ambulatory Visit

## 2024-03-22 ENCOUNTER — Inpatient Hospital Stay: Admitting: Physician Assistant

## 2024-03-22 ENCOUNTER — Other Ambulatory Visit (INDEPENDENT_AMBULATORY_CARE_PROVIDER_SITE_OTHER)

## 2024-03-22 VITALS — BP 136/60

## 2024-03-22 VITALS — BP 131/43 | HR 64 | Temp 97.5°F | Resp 20 | Wt 209.2 lb

## 2024-03-22 DIAGNOSIS — C679 Malignant neoplasm of bladder, unspecified: Secondary | ICD-10-CM

## 2024-03-22 DIAGNOSIS — E1169 Type 2 diabetes mellitus with other specified complication: Secondary | ICD-10-CM

## 2024-03-22 DIAGNOSIS — E785 Hyperlipidemia, unspecified: Secondary | ICD-10-CM

## 2024-03-22 DIAGNOSIS — N1831 Chronic kidney disease, stage 3a: Secondary | ICD-10-CM

## 2024-03-22 DIAGNOSIS — Z5111 Encounter for antineoplastic chemotherapy: Secondary | ICD-10-CM | POA: Diagnosis not present

## 2024-03-22 DIAGNOSIS — R7989 Other specified abnormal findings of blood chemistry: Secondary | ICD-10-CM | POA: Diagnosis not present

## 2024-03-22 LAB — LIPID PANEL
Cholesterol: 108 mg/dL (ref 0–200)
HDL: 33.3 mg/dL — ABNORMAL LOW (ref >39.00)
LDL Cholesterol: 58 mg/dL (ref 0–99)
NonHDL: 74.93
Total CHOL/HDL Ratio: 3
Triglycerides: 85 mg/dL (ref 0.0–149.0)
VLDL: 17 mg/dL (ref 0.0–40.0)

## 2024-03-22 LAB — MICROALBUMIN / CREATININE URINE RATIO
Creatinine,U: 59.8 mg/dL
Microalb Creat Ratio: UNDETERMINED mg/g (ref 0.0–30.0)
Microalb, Ur: <0.7 mg/dL

## 2024-03-22 LAB — VITAMIN D 25 HYDROXY (VIT D DEFICIENCY, FRACTURES): VITD: 45.85 ng/mL (ref 30.00–100.00)

## 2024-03-22 LAB — HEMOGLOBIN A1C: Hgb A1c MFr Bld: 6.6 % — ABNORMAL HIGH (ref 4.6–6.5)

## 2024-03-22 LAB — PHOSPHORUS: Phosphorus: 4.3 mg/dL (ref 2.3–4.6)

## 2024-03-22 LAB — VITAMIN B12: Vitamin B-12: 1121 pg/mL — ABNORMAL HIGH (ref 211–911)

## 2024-03-22 MED ORDER — SODIUM CHLORIDE (PF) 0.9 % IJ SOLN
37.5000 mg | Freq: Once | INTRAVENOUS | Status: AC
  Administered 2024-03-22: 37.5 mg via INTRAVESICAL
  Filled 2024-03-22: qty 3.75

## 2024-03-22 MED ORDER — OXYBUTYNIN CHLORIDE 5 MG PO TABS
5.0000 mg | ORAL_TABLET | Freq: Once | ORAL | Status: AC
  Administered 2024-03-22: 5 mg via ORAL
  Filled 2024-03-22: qty 1

## 2024-03-22 MED ORDER — SODIUM CHLORIDE (PF) 0.9 % IJ SOLN
37.5000 mg | Freq: Once | INTRAVENOUS | Status: DC
  Filled 2024-03-22: qty 3.75

## 2024-03-22 MED ORDER — LIDOCAINE HCL URETHRAL/MUCOSAL 2 % EX GEL
1.0000 | Freq: Once | CUTANEOUS | Status: AC
  Administered 2024-03-22: 1 via URETHRAL
  Filled 2024-03-22: qty 1

## 2024-03-22 MED ORDER — PROCHLORPERAZINE MALEATE 10 MG PO TABS
10.0000 mg | ORAL_TABLET | Freq: Once | ORAL | Status: AC
Start: 2024-03-22 — End: 2024-03-22
  Administered 2024-03-22: 10 mg via ORAL
  Filled 2024-03-22: qty 1

## 2024-03-22 MED ORDER — SODIUM CHLORIDE (PF) 0.9 % IJ SOLN
1000.0000 mg | Freq: Once | INTRAVENOUS | Status: AC
  Administered 2024-03-22: 1000 mg via INTRAVESICAL
  Filled 2024-03-22: qty 26.3

## 2024-03-22 MED ORDER — SODIUM CHLORIDE (PF) 0.9 % IJ SOLN
1000.0000 mg | Freq: Once | INTRAVENOUS | Status: DC
  Filled 2024-03-22: qty 26.3

## 2024-03-22 NOTE — Patient Instructions (Signed)
 [facility] - A DEPT OF [facility]  Discharge Instructions: Thank you for choosing [facility] to provide your oncology and hematology care.   If you have a lab appointment with the Cancer Center, please go directly to the Cancer Center and check in at the registration area.   Wear comfortable clothing and clothing appropriate for easy access to any Portacath or PICC line.   We strive to give you quality time with your provider. You may need to reschedule your appointment if you arrive late (15 or more minutes).  Arriving late affects you and other patients whose appointments are after yours.  Also, if you miss three or more appointments without notifying the office, you may be dismissed from the clinic at the provider's discretion.      For prescription refill requests, have your pharmacy contact our office and allow 72 hours for refills to be completed.    Today you received the following chemotherapy and/or immunotherapy agents: Gemzar /Taxotere       To help prevent nausea and vomiting after your treatment, we encourage you to take your nausea medication as directed.  BELOW ARE SYMPTOMS THAT SHOULD BE REPORTED IMMEDIATELY: *FEVER GREATER THAN 100.4 F (38 C) OR HIGHER *CHILLS OR SWEATING *NAUSEA AND VOMITING THAT IS NOT CONTROLLED WITH YOUR NAUSEA MEDICATION *UNUSUAL SHORTNESS OF BREATH *UNUSUAL BRUISING OR BLEEDING *URINARY PROBLEMS (pain or burning when urinating, or frequent urination) *BOWEL PROBLEMS (unusual diarrhea, constipation, pain near the anus) TENDERNESS IN MOUTH AND THROAT WITH OR WITHOUT PRESENCE OF ULCERS (sore throat, sores in mouth, or a toothache) UNUSUAL RASH, SWELLING OR PAIN  UNUSUAL VAGINAL DISCHARGE OR ITCHING   Items with * indicate a potential emergency and should be followed up as soon as possible or go to the Emergency Department if any problems should occur.  Please show the CHEMOTHERAPY ALERT CARD or  IMMUNOTHERAPY ALERT CARD at check-in to the Emergency Department and triage nurse.  Should you have questions after your visit or need to cancel or reschedule your appointment, please contact [facility] - A DEPT OF [facility]  Dept: [phone]  and follow the prompts.  Office hours are 8:00 a.m. to 4:30 p.m. Monday - Friday. Please note that voicemails left after 4:00 p.m. may not be returned until the following business day.  We are closed weekends and major holidays. You have access to a nurse at all times for urgent questions. Please call the main number to the clinic Dept: [phone] and follow the prompts.   For any non-urgent questions, you may also contact your provider using MyChart. We now offer e-Visits for anyone 53 and older to request care online for non-urgent symptoms. For details visit [email or web address].   Also download the MyChart app! Go to the app store, search MyChart, open the app, select [place], and log in with your MyChart username and password.

## 2024-03-23 ENCOUNTER — Ambulatory Visit: Payer: Self-pay | Admitting: Family Medicine

## 2024-03-23 LAB — PARATHYROID HORMONE, INTACT (NO CA): PTH: 28 pg/mL (ref 16–77)

## 2024-03-24 NOTE — Progress Notes (Signed)
 [facility] [phone]   [phone]  PROGRESS NOTE  Patient Care Team: Rilla Baller, MD as PCP - General (Family Medicine) Perla Evalene PARAS, MD as PCP - Cardiology (Cardiology) Myra Rosaline FALCON, [facility] (Inactive) as Pharmacist (Pharmacist) Perla Evalene PARAS, MD as Consulting Physician (Cardiology) Tamea Dedra CROME, MD as Consulting Physician (Pulmonary Disease) Darron Deatrice LABOR, MD as Consulting Physician (Cardiology) Pa, [facility] (Ophthalmology) Tina Pauletta BROCKS, MD as Consulting Physician (Oncology)  CHIEF COMPLAINTS/PURPOSE OF CONSULTATION:  Urothelial carcinoma of the bladder.   Oncology History  Urothelial carcinoma of bladder (HCC)  10/19/2021 Pathology Results   DIAGNOSIS:  A. URINARY BLADDER TUMOR; TRANSURETHRAL RESECTION:  - NONINVASIVE PAPILLARY UROTHELIAL CARCINOMA, HIGH-GRADE (WHO/ISUP).  - MUSCULARIS PROPRIA PRESENT AND UNINVOLVED.   B. URINARY BLADDER, TUMOR BASE; TRANSURETHRAL RESECTION:  - BENIGN MUSCULARIS PROPRIA.  - NO DEFINITE INTACT UROTHELIUM IDENTIFIED.  - NEGATIVE FOR MALIGNANCY.   C. URINARY BLADDER, LEFT POSTERIOR WALL; TRANSURETHRAL RESECTION:  - NONINVASIVE PAPILLARY UROTHELIAL CARCINOMA, LOW-GRADE (WHO/ISUP).  - NO MUSCULARIS PROPRIA IDENTIFIED.    11/12/2021 Initial Diagnosis   Urothelial carcinoma of bladder (HCC)   11/15/2022 Pathology Results   IAGNOSIS:  A. URINARY BLADDER, RIGHT LATERAL WALL; TRANSURETHRAL RESECTION:  - NONINVASIVE PAPILLARY UROTHELIAL CARCINOMA, HIGH-GRADE (WHO/ISUP).  - NO MUSCULARIS PROPRIA IDENTIFIED.   B. URINARY BLADDER TUMOR BASE, RIGHT LATERAL WALL; TRANSURETHRAL BIOPSY:  - PARTIALLY DENUDED, BENIGN APPEARING UROTHELIAL TISSUE.  - MUSCULARIS PROPRIA PRESENT AND UNINVOLVED.   C. URINARY BLADDER, LEFT LATERAL WALL; TRANSURETHRAL RESECTION:  - NONINVASIVE PAPILLARY UROTHELIAL CARCINOMA, LOW-GRADE (WHO/ISUP).  - NO MUSCULARIS PROPRIA IDENTIFIED.    01/16/2024  Pathology Results   1. 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,    03/15/2024 -  Chemotherapy   Patient is on Treatment Plan : BLADDER Gemcitabine  (1000), Docetaxel  (37.5) INTRAVESICAL q7d     03/15/2024 Cancer Staging   Staging form: Urinary Bladder, AJCC 8th Edition - Clinical: Stage 0is (cTis, cN0, cM0) - Signed by Tina Pauletta BROCKS, MD on 03/15/2024 WHO/ISUP grade (low/high): High Grade Histologic grading system: 2 grade system     CURRENT TREATMENT: Intravesical gemcitabine  and docetaxel   HISTORY OF PRESENTING ILLNESS:  [name] 83 y.o. male returns for a follow up prior to Cycle 1, Day 8 of intravesical gemcitabine  and docetaxel . He is unaccompanied for this visit.   On exam today, [name] reports he tolerated the treatment without any significant toxicities. He reports his energy levels were fairly stable without any noticeable fatigue. He has noticed decreased appetite but weight has been overall stable. He denies nausea, vomiting or bowel habit changes. He denies easy bruising or signs of active bleeding including hematuria. He denies any urinary symptoms including spasms. He denies fevers, chills, sweats, shortness of breath, chest pain or cough. He has no other complaints. Rest of the ROS is below.   MEDICAL HISTORY:  Past Medical History:  Diagnosis Date   Actinic keratosis    Allergy    Anginal pain (HCC)    Anxiety    Aortic ectasia, abdominal (HCC)    a.) CT abd 01/01/2022: 2.9 cm infrarenal abdominal aorta   Aortic stenosis    Asthma    Bladder cancer (HCC) 2023   BPH (benign prostatic hyperplasia) 09/06/2007   CAD s/p CABG    a.) 1990 s/p MI--> CABG x 3; b.) LHC/PCI 2002 --> BMS (unk type) to LCx   Carotid arterial disease (HCC)  a.) 08/2016 Carotid U/S: <39% bilat; b.) carotid doppler 10/12/2022: mild-to-moderate (<50%) bilateral bifurcation plaque.   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 06/2019 Echo (in setting of COVID): EF 35-40%, mild LVH, g1 DD, glob HK; b. 09/2019 Echo: EF 50-55%, Gr1 DD; c. 03/2021 Echo: EF 50-55%, no rwma, mild LVH, GrI DD, nl RV fxn. RVSP 44.29mmHg. Mild-mod dil LA. Triv MR. Mild-mod Ao sclerosis.   History of 2019 novel coronavirus disease (COVID-19) 05/20/2019   History of hiatal hernia    History of shingles    HLD (hyperlipidemia) 04/26/2007   HTN (hypertension) 04/30/2007   LBBB (left bundle branch block)    Long-term use of aspirin  therapy    Lumbar disc disease with radiculopathy    Migraines    Myocardial infarction (HCC) 1990   a.) resulted in 3v CABG   Myocardial infarction (HCC) 2002   a.) second cardiac event; PCI with placement of BMS (unknown type) to LCx   Neuropathy of both feet    OSA on CPAP    Osteoarthritis    Oxygen  deficiency    PAD (peripheral artery disease) (HCC)    a. 07/2017 LE duplex: RSFA 75-63m, LSFA 75-93m, 50-74d; c. 08/2018 Periph Angio: No signif AoIliac dzs. Mod-sev Ca2+ RSFA w/ diff dzs throughout- 3 vessel runoff. Borderline signif LSFA dzs w/ mod-sev Ca2+ vessels and 3 vessel runoff below the knee-->Med rx.   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   Right mid dorsum forearm - in situ - EDC   Stroke (HCC)    T2DM (type 2 diabetes mellitus) (HCC)    Traumatic open wound of lower leg with delayed healing, left 12/28/2022    SURGICAL HISTORY: Past Surgical History:  Procedure Laterality Date   ABDOMINAL AORTOGRAM W/LOWER EXTREMITY [name] 09/12/2018   Procedure: ABDOMINAL AORTOGRAM W/LOWER EXTREMITY;  Surgeon: Darron Deatrice LABOR, MD;  [facility];  Service: Cardiovascular;  Laterality: [name];   ANTERIOR CERVICAL DECOMP/DISCECTOMY FUSION [name] 07/11/2022   Anterior Cervical Decompression Fusion ,Interboy Prothesis,Plate/Screws , Cervical four-five,Cervical five-six Gavin, Reyes, MD)   APPENDECTOMY     rupture   BACK SURGERY     1984 and then [facility]   BLADDER INSTILLATION [name] 10/19/2021   Procedure: BLADDER INSTILLATION OF GEMCITABINE ;  Surgeon: Twylla Glendia BROCKS, MD;  Location: [facility];  Service: Urology;  Laterality: [name];   BLADDER INSTILLATION [name]  11/15/2022   Procedure: BLADDER INSTILLATION OF GEMCITABINE ;  Surgeon: Twylla Glendia BROCKS, MD;  Location: [facility] OR[facility];  Service: Urology;  Laterality: [name];   CARDIAC CATHETERIZATION [name] 07/07/2015   Procedure: Left Heart Cath and Cors/Grafts Angiography;  Surgeon: Evalene JINNY Lunger, MD;  Location: [facility];  Service: Cardiovascular;  Laterality: [name];   CATARACT EXTRACTION, BILATERAL  2009   CERVICAL SPINE SURGERY  12/2016   cervical stenosis Gavin)   COLONOSCOPY  03/2010   HP polyp, diverticulosis, rpt 10 yrs ([name])   CORONARY ANGIOPLASTY  11/30/2000   LCX   CORONARY ARTERY BYPASS GRAFT  1990   3 vessel    CORONARY STENT INTERVENTION [name] 01/13/2023   Procedure: CORONARY STENT INTERVENTION;  Surgeon: Darron Deatrice LABOR, MD;  Location: [facility]  [facility] LAB;  Service: Cardiovascular;  Laterality: [name];   CYSTOSCOPY  12/23/2010   [facility]   CYSTOSCOPY WITH BIOPSY [name] 01/16/2024   Procedure: CYSTOSCOPY, WITH BIOPSY;  Surgeon: Twylla Glendia BROCKS, MD;  Location: [facility];  Service: Urology;  Laterality: [name];   EYE SURGERY     JOINT REPLACEMENT     KNEE ARTHROSCOPY Right    x2   LAMINOTOMY  1986   L5/S1 lumbar laminotomy for two ruptured discs/fusion   LEFT HEART CATH AND CORS/GRAFTS ANGIOGRAPHY [name] 01/19/2024   Procedure: LEFT HEART CATH AND CORS/GRAFTS ANGIOGRAPHY;  Surgeon: Mady Bruckner, MD;  Location: [facility];  Service: Cardiovascular;  Laterality: [name];   LUMBAR LAMINECTOMY/DECOMPRESSION MICRODISCECTOMY [name] 07/13/2016   Procedure: LUMBAR TWO-THREE, LUMBAR THREE-FOUR, LUMBAR FOUR-FIVE LAMINECTOMY AND FORAMINOTOMY;  Surgeon: Reyes Budge, MD;  Location: [facility];  Service: Neurosurgery;  Laterality: [name];  LAMINECTOMY AND FORAMINOTOMY L2-L3, L3-L4,L4-L5   LUMBAR SPINE SURGERY  06/2020   Bilateral redo laminectomy/laminotomy/foraminotomies/medial facetectomy to decompress the bilateral L4 and L5 nerve roots Gavin)   REVERSE SHOULDER ARTHROPLASTY Right 12/21/2023   Procedure: ARTHROPLASTY, SHOULDER, TOTAL, REVERSE;  Surgeon: Melita Drivers, MD;  Location: [facility];  Service: Orthopedics;  Laterality: Right;   RIGHT/LEFT HEART CATH AND CORONARY ANGIOGRAPHY Bilateral 01/13/2023   Procedure: RIGHT/LEFT HEART CATH AND CORONARY ANGIOGRAPHY;  Surgeon: Darron Deatrice LABOR, MD;  Location: [facility];  Service: Cardiovascular;  Laterality: Bilateral;   SPINE SURGERY     TOTAL HIP ARTHROPLASTY Bilateral 1999,2000   TOTAL KNEE ARTHROPLASTY Right 03/06/2017   Procedure: RIGHT TOTAL KNEE ARTHROPLASTY;  Surgeon: Melodi Lerner, MD;  Location: [facility];  Service: Orthopedics;  Laterality: Right;   TRANSURETHRAL RESECTION OF BLADDER TUMOR [name] 10/19/2021   Procedure: TRANSURETHRAL RESECTION OF BLADDER TUMOR (TURBT);  Surgeon: Twylla Glendia BROCKS,  MD;  Location: [facility];  Service: Urology;  Laterality: [name];   TRANSURETHRAL RESECTION OF BLADDER TUMOR [name] 11/15/2022   Procedure: TRANSURETHRAL RESECTION OF BLADDER TUMOR (TURBT);  Surgeon: Twylla Glendia BROCKS, MD;  Location: [facility];  Service: Urology;  Laterality: [name];    SOCIAL HISTORY: Social History   Socioeconomic History   Marital status: Married    Spouse name: [name]   Number of children: 1   Years of education: Not on file   Highest education level: Never attended school  Occupational History   Not on file  Tobacco Use   Smoking status: Former    Current packs/day: 0.00    Average packs/day: 1 pack/day for 25.0 years (25.0 ttl pk-yrs)    Types: Cigarettes    Start date: 08/16/1963    Quit date: 08/15/1988    Years since quitting: 35.6    Passive exposure:  Past   Smokeless tobacco: Never  Vaping Use   Vaping status: Never Used  Substance and Sexual Activity   Alcohol use: No    Alcohol/week: 0.0 standard drinks of alcohol   Drug use: No   Sexual activity: Yes  Other Topics Concern   Not on file  Social History Narrative   Married, wife [name], for 25 years in 2016. 1 daughter and 3 grandkids from first marriage.    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    Diet: good water , fruits/vegetables daily      OSA eval recommended while hospitalized with Covid 05/2019   Social Drivers of Health   Financial Resource Strain: Low Risk  (02/22/2024)   Overall Financial Resource Strain (CARDIA)    Difficulty of Paying Living Expenses: Not hard at all  Food Insecurity: No Food Insecurity (02/22/2024)   Hunger Vital Sign    Worried About Running Out of Food in the Last Year: Never true    Ran Out of Food in the Last Year: Never true  Transportation Needs: No Transportation Needs (02/22/2024)   PRAPARE - Scientist, research (physical sciences) (Medical): No    Lack of Transportation (Non-Medical): No  Physical Activity: Insufficiently Active (02/22/2024)   Exercise Vital Sign    Days of Exercise per Week: 2 days    Minutes of Exercise per Session: 30 min  Stress: No Stress Concern Present (02/22/2024)   [employer] of Occupational Health - Occupational Stress Questionnaire    Feeling of Stress: Not at all  Social Connections: Socially Integrated (02/22/2024)   Social Connection and Isolation Panel    Frequency of Communication with Friends and Family: More than three times a week    Frequency of Social Gatherings with Friends and Family: More than three times a week    Attends Religious Services: More than 4 times per year    Active Member of [employer] or Organizations: Yes    Attends Engineer, structural: More than 4 times per year    Marital Status: Married  Catering manager Violence: Not At Risk (02/22/2024)   Humiliation, Afraid, Rape, and Kick questionnaire    Fear of Current or Ex-Partner: No    Emotionally Abused: No    Physically Abused: No    Sexually Abused: No    FAMILY HISTORY: Family History  Problem Relation Age of Onset   Stroke Mother    Heart attack Mother    Diabetes Mother    Stroke Father    Heart disease Father    Polycystic kidney disease Father    Kidney disease Father    Cancer Sister        throat   Polycystic kidney disease Sister    Kidney disease Sister    Diabetes Brother        1/2 brother   Cancer Other        5/7 nephews with lung cancer    ALLERGIES:  is allergic to vioxx [rofecoxib], oxycodone , spiriva  respimat [tiotropium bromide monohydrate ], contrast media [iodinated contrast media], and morphine .  MEDICATIONS:  Current Outpatient Medications  Medication Sig Dispense Refill   albuterol  (PROVENTIL ) (2.5 MG/3ML) 0.083% nebulizer solution TAKE 3 MLS BY NEBULIZATION EVERY 6 HOURSAS NEEDED FOR WHEEZING OR SHORTNESS OF BREATH 75 mL 12   albuterol   (VENTOLIN  HFA) 108 (90 Base) MCG/ACT  inhaler TAKE 2 PUFFS INTO LUNGS EVERY 6 HOURS ASNEEDED FOR WHEEZING OR SHORTNESS OF BREATH 8.5 each 3   aspirin  EC 81 MG EC tablet Take 1 tablet (81 mg total) by mouth daily. 30 tablet 0   atorvastatin  (LIPITOR) 20 MG tablet TAKE 1 TABLET BY MOUTH DAILY 90 tablet 3   Cyanocobalamin  (VITAMIN B-12 ER PO) Take 1 capsule by mouth daily.     docusate sodium  (COLACE) 100 MG capsule Take 1 capsule (100 mg total) by mouth daily as needed for mild constipation.     Dupilumab  (DUPIXENT ) 300 MG/2ML SOAJ Inject 300 mg into the skin every 14 (fourteen) days. **patient completed loading dose in clinic** 12 mL 1   ezetimibe  (ZETIA ) 10 MG tablet TAKE 1 TABLET BY MOUTH ONCE DAILY 90 tablet 3   famotidine  (PEPCID ) 20 MG tablet Take 20 mg by mouth daily.     fluorouracil  (EFUDEX ) 5 % cream Apply to nose twice a day x 7 days 30 g 2   fluticasone  (FLONASE ) 50 MCG/ACT nasal spray Place 1 spray into both nostrils daily as needed for allergies or rhinitis.     Fluticasone -Umeclidin-Vilant (TRELEGY ELLIPTA ) 200-62.5-25 MCG/ACT AEPB Inhale 1 puff into the lungs daily. 180 each 3   furosemide  (LASIX ) 40 MG tablet Take 0.5 tablets (20 mg total) by mouth daily as needed. Take 1 tablet (40 mg) daily as need for weight greater then 208. 180 tablet 3   gabapentin  (NEURONTIN ) 600 MG tablet Take 1 tablet (600 mg total) by mouth 3 (three) times daily. 270 tablet 4   hydrocortisone  2.5 % cream Apply to chin and beside the nose at bedtime Tues, Thurs, Sat 30 g 5   isosorbide  mononitrate (IMDUR ) 30 MG 24 hr tablet TAKE 1 TABLET BY MOUTH DAILY. 90 tablet 3   ketoconazole  (NIZORAL ) 2 % cream Apply to chin and beside the nose at bedtime Mon, Wed, Fri 15 g 0   losartan  (COZAAR ) 25 MG tablet Take 1 tablet (25 mg total) by mouth daily. 90 tablet 3   metoprolol  succinate (TOPROL  XL) 25 MG 24 hr tablet Take 0.5 tablets (12.5 mg total) by mouth daily. 45 tablet 3   montelukast  (SINGULAIR ) 10 MG tablet TAKE 1  TABLET BY MOUTH DAILY 30 tablet 11   mupirocin  ointment (BACTROBAN ) 2 % Apply 1 Application topically 2 (two) times daily. 22 g 0   nitroGLYCERIN  (NITROSTAT ) 0.4 MG SL tablet Place 1 tablet (0.4 mg total) under the tongue every 5 (five) minutes as needed for chest pain. 25 tablet 3   ondansetron  (ZOFRAN ) 8 MG tablet Take 1 tablet (8 mg total) by mouth every 8 (eight) hours as needed for nausea or vomiting. 30 tablet 1   phenazopyridine  (PYRIDIUM ) 200 MG tablet Take 1 tablet (200 mg total) by mouth 3 (three) times daily as needed for pain. One on day before treatment, then as needed 30 tablet 1   polyethylene glycol (MIRALAX  / GLYCOLAX ) 17 g packet Take 17 g by mouth daily as needed for mild constipation.      prochlorperazine  (COMPAZINE ) 10 MG tablet Take 1 tablet (10 mg total) by mouth every 6 (six) hours as needed for nausea or vomiting. 30 tablet 1   ranolazine  (RANEXA ) 500 MG 12 hr tablet TAKE ONE (1) TABLET BY MOUTH TWO TIMES PER DAY 180 tablet 3   sodium bicarbonate  650 MG tablet Take 2 tabs (1300 mg) of sodium bicarbonate  (two 650 mg tabs) the night before and morning of therapy 28 tablet  0   spironolactone  (ALDACTONE ) 25 MG tablet Take 0.5 tablets (12.5 mg total) by mouth daily. 45 tablet 3   tamsulosin  (FLOMAX ) 0.4 MG CAPS capsule TAKE 1 CAPSULE BY MOUTH AT BEDTIME 90 capsule 1   No current facility-administered medications for this visit.    REVIEW OF SYSTEMS:   Constitutional: ( - ) fevers, ( - )  chills , ( - ) night sweats Eyes: ( - ) blurriness of vision, ( - ) double vision, ( - ) watery eyes Ears, nose, mouth, throat, and face: ( - ) mucositis, ( - ) sore throat Respiratory: ( - ) cough, ( - ) dyspnea, ( - ) wheezes Cardiovascular: ( - ) palpitation, ( - ) chest discomfort, ( - ) lower extremity swelling Gastrointestinal:  ( - ) nausea, ( - ) heartburn, ( - ) change in bowel habits Skin: ( - ) abnormal skin rashes Lymphatics: ( - ) new lymphadenopathy, ( - ) easy  bruising Neurological: ( - ) numbness, ( - ) tingling, ( - ) new weaknesses Behavioral/Psych: ( - ) mood change, ( - ) new changes  All other systems were reviewed with the patient and are negative.  PHYSICAL EXAMINATION: ECOG PERFORMANCE STATUS: 0 - Asymptomatic  Vitals:   03/22/24 1140  BP: (!) 131/43  Pulse: 64  Resp: 20  Temp: (!) 97.5 F (36.4 C)  SpO2: 94%   Filed Weights   03/22/24 1140  Weight: 209 lb 3.2 oz (94.9 kg)    GENERAL: well appearing male in NAD  SKIN: skin color, texture, turgor are normal, no rashes or significant lesions EYES: conjunctiva are pink and non-injected, sclera clear OROPHARYNX: no exudate, no erythema; lips, buccal mucosa, and tongue normal  LUNGS: clear to auscultation and percussion with normal breathing effort HEART: regular rate & rhythm and no murmurs and no lower extremity edema Musculoskeletal: no cyanosis of digits and no clubbing  PSYCH: alert & oriented x 3, fluent speech NEURO: no focal motor/sensory deficits  LABORATORY DATA:  I have reviewed the data as listed    Latest Ref Rng & Units 03/15/2024    9:32 AM 01/19/2024   12:46 AM 01/18/2024    2:17 AM  CBC  WBC 4.0 - 10.5 K/uL 5.1  6.0  6.3   Hemoglobin 13.0 - 17.0 g/dL 87.7  88.4  88.6   Hematocrit 39.0 - 52.0 % 36.6  34.8  34.4   Platelets 150 - 400 K/uL 153  160  182        Latest Ref Rng & Units 03/15/2024    9:32 AM 01/19/2024   12:46 AM 01/18/2024    2:17 AM  CMP  Glucose 70 - 99 mg/dL 890  877  838   BUN 8 - 23 mg/dL 34  40  38   Creatinine 0.61 - 1.24 mg/dL 8.40  8.53  8.49   Sodium 135 - 145 mmol/L 141  139  139   Potassium 3.5 - 5.1 mmol/L 4.4  3.7  4.6   Chloride 98 - 111 mmol/L 107  104  105   CO2 22 - 32 mmol/L 29  26  25    Calcium  8.9 - 10.3 mg/dL 8.9  8.5  8.7   Total Protein 6.5 - 8.1 g/dL 6.2     Total Bilirubin 0.0 - 1.2 mg/dL 0.6     Alkaline Phos 38 - 126 U/L 27     AST 15 - 41 U/L 13     ALT  0 - 44 U/L 10       RADIOGRAPHIC STUDIES: I have  personally reviewed the radiological images as listed and agreed with the findings in the report. No results found.  ASSESSMENT & PLAN [name] is a 83 y.o. male who presents to the clinic for evaluation of urothelial carcinoma of the bladder.   #Urothelial carcinoma of the bladder: --Treatment includes intravesical gemcitabine  and docetaxel , started on 03/15/2024. --Supportive meds include Pyridium  200 mg the morning of treatment, then 3 times daily for 2 days as needed for pain, sodium bicarbonate  (two 650 mg tabs) 1300 mg the night before and morning of therapy to alkalinize the urine, Premed with oxybutynin  and compazine . PLAN: --Due for Cycle 1, Day 8 today. --Patient is tolerating treatment without any new or concerning symptoms --Proceed with treatment today without any dose modifications.     No orders of the defined types were placed in this encounter.   All questions were answered. The patient knows to call the clinic with any problems, questions or concerns.  I have spent a total of 25 minutes minutes of face-to-face and non-face-to-face time, preparing to see the patient, obtaining and/or reviewing separately obtained history, performing a medically appropriate examination, counseling and educating the patient, ordering medications/tests/procedures,documenting clinical information in the electronic health record, independently interpreting results and communicating results to the patient, and care coordination.   Johnston Police, PA-C Department of Hematology/Oncology [facility] at [facility] Phone: [phone]

## 2024-03-28 ENCOUNTER — Ambulatory Visit

## 2024-03-28 ENCOUNTER — Inpatient Hospital Stay

## 2024-03-28 ENCOUNTER — Inpatient Hospital Stay (HOSPITAL_BASED_OUTPATIENT_CLINIC_OR_DEPARTMENT_OTHER)

## 2024-03-28 VITALS — BP 155/60 | HR 81 | Temp 97.9°F | Resp 18 | Wt 210.0 lb

## 2024-03-28 DIAGNOSIS — Z5111 Encounter for antineoplastic chemotherapy: Secondary | ICD-10-CM | POA: Diagnosis not present

## 2024-03-28 DIAGNOSIS — C679 Malignant neoplasm of bladder, unspecified: Secondary | ICD-10-CM

## 2024-03-28 DIAGNOSIS — D649 Anemia, unspecified: Secondary | ICD-10-CM | POA: Diagnosis not present

## 2024-03-28 DIAGNOSIS — N1832 Chronic kidney disease, stage 3b: Secondary | ICD-10-CM

## 2024-03-28 DIAGNOSIS — M533 Sacrococcygeal disorders, not elsewhere classified: Secondary | ICD-10-CM | POA: Diagnosis not present

## 2024-03-28 LAB — URINALYSIS, COMPLETE (UACMP) WITH MICROSCOPIC
Bacteria, UA: NONE SEEN
Bilirubin Urine: NEGATIVE
Glucose, UA: NEGATIVE mg/dL
Hgb urine dipstick: NEGATIVE
Ketones, ur: NEGATIVE mg/dL
Leukocytes,Ua: NEGATIVE
Nitrite: POSITIVE — AB
Protein, ur: NEGATIVE mg/dL
Specific Gravity, Urine: 1.011 (ref 1.005–1.030)
pH: 6 (ref 5.0–8.0)

## 2024-03-28 LAB — CMP (CANCER CENTER ONLY)
ALT: 12 U/L (ref 0–44)
AST: 14 U/L — ABNORMAL LOW (ref 15–41)
Albumin: 4.3 g/dL (ref 3.5–5.0)
Alkaline Phosphatase: 33 U/L — ABNORMAL LOW (ref 38–126)
Anion gap: 6 (ref 5–15)
BUN: 39 mg/dL — ABNORMAL HIGH (ref 8–23)
CO2: 29 mmol/L (ref 22–32)
Calcium: 9.1 mg/dL (ref 8.9–10.3)
Chloride: 103 mmol/L (ref 98–111)
Creatinine: 1.75 mg/dL — ABNORMAL HIGH (ref 0.61–1.24)
GFR, Estimated: 38 mL/min — ABNORMAL LOW (ref >60)
Glucose, Bld: 175 mg/dL — ABNORMAL HIGH (ref 70–99)
Potassium: 4.4 mmol/L (ref 3.5–5.1)
Sodium: 138 mmol/L (ref 135–145)
Total Bilirubin: 0.6 mg/dL (ref 0.0–1.2)
Total Protein: 6.5 g/dL (ref 6.5–8.1)

## 2024-03-28 LAB — CBC WITH DIFFERENTIAL (CANCER CENTER ONLY)
Abs Immature Granulocytes: 0.01 K/uL (ref 0.00–0.07)
Basophils Absolute: 0 K/uL (ref 0.0–0.1)
Basophils Relative: 0 %
Eosinophils Absolute: 0.1 K/uL (ref 0.0–0.5)
Eosinophils Relative: 1 %
HCT: 37.3 % — ABNORMAL LOW (ref 39.0–52.0)
Hemoglobin: 12.8 g/dL — ABNORMAL LOW (ref 13.0–17.0)
Immature Granulocytes: 0 %
Lymphocytes Relative: 10 %
Lymphs Abs: 0.8 K/uL (ref 0.7–4.0)
MCH: 32.2 pg (ref 26.0–34.0)
MCHC: 34.3 g/dL (ref 30.0–36.0)
MCV: 94 fL (ref 80.0–100.0)
Monocytes Absolute: 0.2 K/uL (ref 0.1–1.0)
Monocytes Relative: 2 %
Neutro Abs: 6.5 K/uL (ref 1.7–7.7)
Neutrophils Relative %: 87 %
Platelet Count: 175 K/uL (ref 150–400)
RBC: 3.97 MIL/uL — ABNORMAL LOW (ref 4.22–5.81)
RDW: 13.2 % (ref 11.5–15.5)
WBC Count: 7.5 K/uL (ref 4.0–10.5)
nRBC: 0 % (ref 0.0–0.2)

## 2024-03-28 MED ORDER — SODIUM CHLORIDE (PF) 0.9 % IJ SOLN
37.5000 mg | Freq: Once | INTRAVENOUS | Status: AC
  Administered 2024-03-28: 37.5 mg via INTRAVESICAL
  Filled 2024-03-28: qty 3.75

## 2024-03-28 MED ORDER — OXYBUTYNIN CHLORIDE 5 MG PO TABS: 5.0000 mg | ORAL_TABLET | Freq: Once | ORAL | Status: DC | PRN

## 2024-03-28 MED ORDER — OXYBUTYNIN CHLORIDE 5 MG PO TABS
5.0000 mg | ORAL_TABLET | Freq: Once | ORAL | Status: AC
  Administered 2024-03-28: 5 mg via ORAL
  Filled 2024-03-28: qty 1

## 2024-03-28 MED ORDER — SODIUM CHLORIDE (PF) 0.9 % IJ SOLN
1000.0000 mg | Freq: Once | INTRAVENOUS | Status: AC
  Administered 2024-03-28: 1000 mg via INTRAVESICAL
  Filled 2024-03-28: qty 26.3

## 2024-03-28 MED ORDER — PROCHLORPERAZINE MALEATE 10 MG PO TABS
10.0000 mg | ORAL_TABLET | Freq: Once | ORAL | Status: AC
  Administered 2024-03-28: 10 mg via ORAL
  Filled 2024-03-28: qty 1

## 2024-03-28 MED ORDER — LIDOCAINE HCL URETHRAL/MUCOSAL 2 % EX GEL
1.0000 | Freq: Once | CUTANEOUS | Status: AC
  Administered 2024-03-28: 1 via URETHRAL
  Filled 2024-03-28: qty 1

## 2024-03-28 NOTE — Assessment & Plan Note (Addendum)
 Improved this week. Okay to treat without CBC next week.

## 2024-03-28 NOTE — Assessment & Plan Note (Addendum)
 Stable. Ok to treat next week without lab unless new clinical changes. Discussed increase fluid after each treatment. Fluid goal 64 oz per day

## 2024-03-28 NOTE — Progress Notes (Signed)
 [facility] OFFICE PROGRESS NOTE  Patient Care Team: Rilla Baller, MD as PCP - General (Family Medicine) Perla, Evalene PARAS, MD as PCP - Cardiology (Cardiology) Myra Rosaline FALCON, [facility] (Inactive) as Pharmacist (Pharmacist) Perla Evalene PARAS, MD as Consulting Physician (Cardiology) Tamea Dedra CROME, MD as Consulting Physician (Pulmonary Disease) Darron Deatrice LABOR, MD as Consulting Physician (Cardiology) Pa, [facility] (Ophthalmology) Tina Pauletta BROCKS, MD as Consulting Physician (Oncology)  [name] is a 83 y.o.male with history of DVT, CKD 3, CAD with MI, CABG, emphysema, hypertension, hyperlipidemia, type 2 diabetes, carotid artery disease, BPH, DDD 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  Patient started intravesical gemcitabine  with docetaxel  without issue. Now week 3. Tolerating well.  Patient will call if signs of UTI. He reports he had UTI before and knows how it feels like. Assessment & Plan Urothelial carcinoma of bladder (HCC) Pyridium  200 mg the morning of treatment, then 3 times daily for 2 days as needed for pain sodium bicarbonate  (two 650 mg tabs) 1300 mg the night before and morning of therapy to alkalinize the urine. Premed with oxybutynin  and compazine  Return weekly with lab and MD/APP visit during induction Follow up with cystoscopy every 3 months. Will communicate with Dr. Twylla Stage 3b chronic kidney disease (HCC) Stable. Ok to treat next week without lab unless new clinical changes. Discussed increase fluid after each treatment. Fluid goal 64 oz per day Normocytic anemia Improved this week. Okay to treat without CBC next week.   Pauletta BROCKS Tina, MD  INTERVAL HISTORY: Patient returns for follow-up. He denies any urinary symptoms including spasms or bleeding.  Oncology History  Urothelial carcinoma of bladder (HCC)  10/19/2021 Pathology Results   DIAGNOSIS:  A. URINARY  BLADDER TUMOR; TRANSURETHRAL RESECTION:  - NONINVASIVE PAPILLARY UROTHELIAL CARCINOMA, HIGH-GRADE (WHO/ISUP).  - MUSCULARIS PROPRIA PRESENT AND UNINVOLVED.   B. URINARY BLADDER, TUMOR BASE; TRANSURETHRAL RESECTION:  - BENIGN MUSCULARIS PROPRIA.  - NO DEFINITE INTACT UROTHELIUM IDENTIFIED.  - NEGATIVE FOR MALIGNANCY.   C. URINARY BLADDER, LEFT POSTERIOR WALL; TRANSURETHRAL RESECTION:  - NONINVASIVE PAPILLARY UROTHELIAL CARCINOMA, LOW-GRADE (WHO/ISUP).  - NO MUSCULARIS PROPRIA IDENTIFIED.    11/12/2021 Initial Diagnosis   Urothelial carcinoma of bladder (HCC)   11/15/2022 Pathology Results   IAGNOSIS:  A. URINARY BLADDER, RIGHT LATERAL WALL; TRANSURETHRAL RESECTION:  - NONINVASIVE PAPILLARY UROTHELIAL CARCINOMA, HIGH-GRADE (WHO/ISUP).  - NO MUSCULARIS PROPRIA IDENTIFIED.   B. URINARY BLADDER TUMOR BASE, RIGHT LATERAL WALL; TRANSURETHRAL BIOPSY:  - PARTIALLY DENUDED, BENIGN APPEARING UROTHELIAL TISSUE.  - MUSCULARIS PROPRIA PRESENT AND UNINVOLVED.   C. URINARY BLADDER, LEFT LATERAL WALL; TRANSURETHRAL RESECTION:  - NONINVASIVE PAPILLARY UROTHELIAL CARCINOMA, LOW-GRADE (WHO/ISUP).  - NO MUSCULARIS PROPRIA IDENTIFIED.    01/16/2024 Pathology Results   1. 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,    03/15/2024 -  Chemotherapy   Patient is on Treatment Plan : BLADDER Gemcitabine  (1000), Docetaxel  (37.5) INTRAVESICAL q7d     03/15/2024 Cancer Staging   Staging form: Urinary Bladder, AJCC 8th Edition - Clinical: Stage 0is (cTis, cN0, cM0) - Signed by Tina Pauletta BROCKS, MD on 03/15/2024 WHO/ISUP grade (low/high): High Grade Histologic grading system: 2 grade system      PHYSICAL EXAMINATION: ECOG PERFORMANCE STATUS: 1 - Symptomatic but completely ambulatory  VSS  GENERAL: alert, no distress and comfortable LUNGS: clear to auscultation and percussion with normal breathing effort HEART: regular rate &  rhythm  ABDOMEN: abdomen soft,  non-tender and nondistended. Urinary catheter in place, no hematuria   Relevant data reviewed during this visit included labs.

## 2024-03-28 NOTE — Assessment & Plan Note (Addendum)
 Pyridium  200 mg the morning of treatment, then 3 times daily for 2 days as needed for pain sodium bicarbonate  (two 650 mg tabs) 1300 mg the night before and morning of therapy to alkalinize the urine. Premed with oxybutynin  and compazine  Return weekly with lab and MD/APP visit during induction Follow up with cystoscopy every 3 months. Will communicate with Dr. Twylla

## 2024-03-28 NOTE — Patient Instructions (Signed)
 [facility] - A DEPT OF [facility]  Discharge Instructions: Thank you for choosing [facility] to provide your oncology and hematology care.   If you have a lab appointment with the Cancer Center, please go directly to the Cancer Center and check in at the registration area.   Wear comfortable clothing and clothing appropriate for easy access to any Portacath or PICC line.   We strive to give you quality time with your provider. You may need to reschedule your appointment if you arrive late (15 or more minutes).  Arriving late affects you and other patients whose appointments are after yours.  Also, if you miss three or more appointments without notifying the office, you may be dismissed from the clinic at the provider's discretion.      For prescription refill requests, have your pharmacy contact our office and allow 72 hours for refills to be completed.    Today you received the following chemotherapy and/or immunotherapy agents gemzar  and taxotere       To help prevent nausea and vomiting after your treatment, we encourage you to take your nausea medication as directed.  BELOW ARE SYMPTOMS THAT SHOULD BE REPORTED IMMEDIATELY: *FEVER GREATER THAN 100.4 F (38 C) OR HIGHER *CHILLS OR SWEATING *NAUSEA AND VOMITING THAT IS NOT CONTROLLED WITH YOUR NAUSEA MEDICATION *UNUSUAL SHORTNESS OF BREATH *UNUSUAL BRUISING OR BLEEDING *URINARY PROBLEMS (pain or burning when urinating, or frequent urination) *BOWEL PROBLEMS (unusual diarrhea, constipation, pain near the anus) TENDERNESS IN MOUTH AND THROAT WITH OR WITHOUT PRESENCE OF ULCERS (sore throat, sores in mouth, or a toothache) UNUSUAL RASH, SWELLING OR PAIN  UNUSUAL VAGINAL DISCHARGE OR ITCHING   Items with * indicate a potential emergency and should be followed up as soon as possible or go to the Emergency Department if any problems should occur.  Please show the CHEMOTHERAPY ALERT CARD or  IMMUNOTHERAPY ALERT CARD at check-in to the Emergency Department and triage nurse.  Should you have questions after your visit or need to cancel or reschedule your appointment, please contact [facility] - A DEPT OF [facility]  Dept: [phone]  and follow the prompts.  Office hours are 8:00 a.m. to 4:30 p.m. Monday - Friday. Please note that voicemails left after 4:00 p.m. may not be returned until the following business day.  We are closed weekends and major holidays. You have access to a nurse at all times for urgent questions. Please call the main number to the clinic Dept: [phone] and follow the prompts.   For any non-urgent questions, you may also contact your provider using MyChart. We now offer e-Visits for anyone 61 and older to request care online for non-urgent symptoms. For details visit [email or web address].   Also download the MyChart app! Go to the app store, search MyChart, open the app, select [facility], and log in with your MyChart username and password.

## 2024-03-29 ENCOUNTER — Ambulatory Visit

## 2024-03-29 ENCOUNTER — Ambulatory Visit: Admitting: Family Medicine

## 2024-03-29 VITALS — BP 138/62 | HR 66 | Temp 98.3°F | Ht 77.0 in | Wt 210.2 lb

## 2024-03-29 DIAGNOSIS — I10 Essential (primary) hypertension: Secondary | ICD-10-CM

## 2024-03-29 DIAGNOSIS — I6523 Occlusion and stenosis of bilateral carotid arteries: Secondary | ICD-10-CM

## 2024-03-29 DIAGNOSIS — G6289 Other specified polyneuropathies: Secondary | ICD-10-CM

## 2024-03-29 DIAGNOSIS — Z0001 Encounter for general adult medical examination with abnormal findings: Secondary | ICD-10-CM | POA: Diagnosis not present

## 2024-03-29 DIAGNOSIS — M48062 Spinal stenosis, lumbar region with neurogenic claudication: Secondary | ICD-10-CM

## 2024-03-29 DIAGNOSIS — E1169 Type 2 diabetes mellitus with other specified complication: Secondary | ICD-10-CM | POA: Diagnosis not present

## 2024-03-29 DIAGNOSIS — R011 Cardiac murmur, unspecified: Secondary | ICD-10-CM

## 2024-03-29 DIAGNOSIS — I5032 Chronic diastolic (congestive) heart failure: Secondary | ICD-10-CM

## 2024-03-29 DIAGNOSIS — I25110 Atherosclerotic heart disease of native coronary artery with unstable angina pectoris: Secondary | ICD-10-CM

## 2024-03-29 DIAGNOSIS — E785 Hyperlipidemia, unspecified: Secondary | ICD-10-CM | POA: Diagnosis not present

## 2024-03-29 DIAGNOSIS — N1831 Chronic kidney disease, stage 3a: Secondary | ICD-10-CM

## 2024-03-29 DIAGNOSIS — J4489 Other specified chronic obstructive pulmonary disease: Secondary | ICD-10-CM

## 2024-03-29 DIAGNOSIS — Z7189 Other specified counseling: Secondary | ICD-10-CM

## 2024-03-29 DIAGNOSIS — I739 Peripheral vascular disease, unspecified: Secondary | ICD-10-CM

## 2024-03-29 DIAGNOSIS — R7989 Other specified abnormal findings of blood chemistry: Secondary | ICD-10-CM

## 2024-03-29 DIAGNOSIS — G4733 Obstructive sleep apnea (adult) (pediatric): Secondary | ICD-10-CM

## 2024-03-29 DIAGNOSIS — K219 Gastro-esophageal reflux disease without esophagitis: Secondary | ICD-10-CM | POA: Diagnosis not present

## 2024-03-29 DIAGNOSIS — C679 Malignant neoplasm of bladder, unspecified: Secondary | ICD-10-CM

## 2024-03-29 LAB — URINE CULTURE: Culture: NO GROWTH

## 2024-03-29 MED ORDER — GABAPENTIN 600 MG PO TABS: ORAL_TABLET | ORAL | 3 refills | Status: AC

## 2024-03-29 NOTE — Patient Instructions (Addendum)
 Keep carotid ultrasound appointment for later this month Happy early [age]!  Good to see you today   Return as needed or in 6 months for follow up visit

## 2024-03-29 NOTE — Progress Notes (Addendum)
 Ph: [phone] Fax: [phone]   Patient ID: [name], male    DOB: [birth year], 83 y.o.   MRN: [id number]  This visit was conducted in person.  BP 138/62   Pulse 66   Temp 98.3 F (36.8 C) (Oral)   Ht 6' 5 (1.956 m)   Wt 210 lb 4 oz (95.4 kg)   SpO2 96%   BMI 24.93 kg/m    CC: CPE Subjective:   HPI: [name] is a 83 y.o. male presenting on 03/29/2024 for Annual Exam   Saw health advisor 02/2024 for medicare wellness visit. Note reviewed.    No results found.  Flowsheet Row Office Visit from 03/29/2024 in [facility]  PHQ-2 Total Score 0       03/29/2024   12:25 PM 03/29/2024   11:56 AM 02/22/2024   10:56 AM 01/10/2024    9:26 AM 09/08/2023    9:26 AM  Fall Risk   Falls in the past year? 0 0 0 1 1  Number falls in past yr: 0 0 0 1 0  Injury with Fall? 0 0 0 1 1  Risk for fall due to : No Fall Risks  No Fall Risks    Follow up Falls evaluation completed Falls evaluation completed Education provided;Falls prevention discussed      OSA on CPAP via nasal pillow (face mask not tolerated) and COPD followed by pulmonology [name]). On Telegy but very expensive. Not PAP eligible. Also on Dupixent .   S/p bilateral redo laminectomy/laminotomy/foraminotomies/medial facetectomy (instrumentation and fusion) to decompress the bilateral L4 and L5 nerve roots [name]) 07/06/2020.  Just had L hip steroid injection this week ([name]). This has caused hyperglycemia. H/o torn hamstring on left.   Known PAD (bilateral calcified SFA disease, R>L) followed by Dr [name].   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 Recent heart catheterization s/p angioplasty and stent 01/13/2023 - now on aspirin  + plavix . Has previously had some cardiac rehab.    Known urothelial carcinoma s/p BCG instillations [facility]). Most recently on intravesical gemcitabine  + docetaxel .  Sees onc Dr [name]. Planned follow up cystoscopy 05/2024.   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   Preventative: Colon cancer screening - colonoscopy 03/2010 HP polyp, diverticulosis, rpt 10 yrs ([name]). iFOB normal 01/2020. Denies blood in stool. Aged out.  Prostate cancer screening - sees urology yearly  Lung cancer screening - not eligible  Flu shot - yearly COVID vaccine - Pfizer 08/2019, 09/2019, boosters 04/2020, 01/2021 Td 2007, Tdap 2019 Pneumovax23 2002, 2010, prevnar13 01/2015, prevnar20 08/2023 Zostavax 2010 shingrix - 03/2017, 07/2017 RSV - 09/2022 Advanced directive: received and scanned 05/2019. HCPOA is wife [name] then [employer]. Does not want prolonged life support if terminal condition.  Seat belt use discussed  Sunscreen use discussed. No changing moles on skin. Sees derm [name]).  Sleep - averaging 6-8 hours/night Ex smoker - quit 1990   Alcohol - none  Bowel - no constipation Bladder - stable incontinence managed on flomax . Also on lasix   Married, wife [name], for 25 years in 2016. 1 daughter and 3 grandkids from first marriage. 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 Mows yard.  Diet: good water , fruits/vegetables daily      Relevant past medical, surgical, family and social history reviewed and updated as indicated. Interim medical  history since our last visit reviewed. Allergies and medications reviewed and updated. Outpatient Medications Prior to Visit  Medication Sig Dispense Refill   albuterol  (PROVENTIL ) (2.5 MG/3ML) 0.083% nebulizer solution TAKE 3 MLS BY NEBULIZATION EVERY 6 HOURSAS NEEDED FOR WHEEZING OR SHORTNESS OF BREATH 75 mL 12   albuterol  (VENTOLIN  HFA) 108 (90 Base) MCG/ACT inhaler TAKE 2 PUFFS INTO LUNGS EVERY 6 HOURS ASNEEDED FOR WHEEZING OR SHORTNESS OF BREATH 8.5 each 3   aspirin  EC 81 MG EC tablet Take 1 tablet (81 mg total) by mouth daily.  30 tablet 0   atorvastatin  (LIPITOR) 20 MG tablet TAKE 1 TABLET BY MOUTH DAILY 90 tablet 3   Cyanocobalamin  (VITAMIN B-12 ER PO) Take 1 capsule by mouth daily.     docusate sodium  (COLACE) 100 MG capsule Take 1 capsule (100 mg total) by mouth daily as needed for mild constipation.     Dupilumab  (DUPIXENT ) 300 MG/2ML SOAJ Inject 300 mg into the skin every 14 (fourteen) days. **patient completed loading dose in clinic** 12 mL 1   ezetimibe  (ZETIA ) 10 MG tablet TAKE 1 TABLET BY MOUTH ONCE DAILY 90 tablet 3   famotidine  (PEPCID ) 20 MG tablet Take 20 mg by mouth daily.     fluorouracil  (EFUDEX ) 5 % cream Apply to nose twice a day x 7 days 30 g 2   fluticasone  (FLONASE ) 50 MCG/ACT nasal spray Place 1 spray into both nostrils daily as needed for allergies or rhinitis.     Fluticasone -Umeclidin-Vilant (TRELEGY ELLIPTA ) 200-62.5-25 MCG/ACT AEPB Inhale 1 puff into the lungs daily. 180 each 3   furosemide  (LASIX ) 40 MG tablet Take 0.5 tablets (20 mg total) by mouth daily as needed. Take 1 tablet (40 mg) daily as need for weight greater then 208. 180 tablet 3   hydrocortisone  2.5 % cream Apply to chin and beside the nose at bedtime Tues, Thurs, Sat 30 g 5   isosorbide  mononitrate (IMDUR ) 30 MG 24 hr tablet TAKE 1 TABLET BY MOUTH DAILY. 90 tablet 3   ketoconazole  (NIZORAL ) 2 % cream Apply to chin and beside the nose at bedtime Mon, Wed, Fri 15 g 0   losartan  (COZAAR ) 25 MG tablet Take 1 tablet (25 mg total) by mouth daily. 90 tablet 3   metoprolol  succinate (TOPROL  XL) 25 MG 24 hr tablet Take 0.5 tablets (12.5 mg total) by mouth daily. 45 tablet 3   montelukast  (SINGULAIR ) 10 MG tablet TAKE 1 TABLET BY MOUTH DAILY 30 tablet 11   mupirocin  ointment (BACTROBAN ) 2 % Apply 1 Application topically 2 (two) times daily. 22 g 0   nitroGLYCERIN  (NITROSTAT ) 0.4 MG SL tablet Place 1 tablet (0.4 mg total) under the tongue every 5 (five) minutes as needed for chest pain. 25 tablet 3   ondansetron  (ZOFRAN ) 8 MG tablet Take  1 tablet (8 mg total) by mouth every 8 (eight) hours as needed for nausea or vomiting. 30 tablet 1   phenazopyridine  (PYRIDIUM ) 200 MG tablet Take 1 tablet (200 mg total) by mouth 3 (three) times daily as needed for pain. One on day before treatment, then as needed 30 tablet 1   polyethylene glycol (MIRALAX  / GLYCOLAX ) 17 g packet Take 17 g by mouth daily as needed for mild constipation.      prochlorperazine  (COMPAZINE ) 10 MG tablet Take 1 tablet (10 mg total) by mouth every 6 (six) hours as needed for nausea or vomiting. 30 tablet 1   ranolazine  (RANEXA ) 500 MG 12 hr tablet TAKE ONE (1) TABLET  BY MOUTH TWO TIMES PER DAY 180 tablet 3   sodium bicarbonate  650 MG tablet Take 2 tabs (1300 mg) of sodium bicarbonate  (two 650 mg tabs) the night before and morning of therapy 28 tablet 0   spironolactone  (ALDACTONE ) 25 MG tablet Take 0.5 tablets (12.5 mg total) by mouth daily. 45 tablet 3   tamsulosin  (FLOMAX ) 0.4 MG CAPS capsule TAKE 1 CAPSULE BY MOUTH AT BEDTIME 90 capsule 1   gabapentin  (NEURONTIN ) 600 MG tablet Take 1 tablet (600 mg total) by mouth 3 (three) times daily. 270 tablet 4   No facility-administered medications prior to visit.     Per HPI unless specifically indicated in ROS section below Review of Systems  Constitutional:  Negative for activity change, appetite change, chills, fatigue, fever and unexpected weight change.  HENT:  Negative for hearing loss.   Eyes:  Negative for visual disturbance.  Respiratory:  Positive for cough. Negative for chest tightness, shortness of breath and wheezing.   Cardiovascular:  Positive for leg swelling (left). Negative for chest pain and palpitations.  Gastrointestinal:  Negative for abdominal distention, abdominal pain, blood in stool, constipation, diarrhea, nausea and vomiting.  Genitourinary:  Positive for hematuria (due to known bladder cancer). Negative for difficulty urinating.  Musculoskeletal:  Negative for arthralgias, myalgias and neck  pain.  Skin:  Negative for rash.  Neurological:  Positive for headaches. Negative for dizziness, seizures and syncope.  Hematological:  Negative for adenopathy. Bruises/bleeds easily.  Psychiatric/Behavioral:  Negative for dysphoric mood. The patient is not nervous/anxious.     Objective:  BP 138/62   Pulse 66   Temp 98.3 F (36.8 C) (Oral)   Ht 6' 5 (1.956 m)   Wt 210 lb 4 oz (95.4 kg)   SpO2 96%   BMI 24.93 kg/m   Wt Readings from Last 3 Encounters:  03/29/24 210 lb 4 oz (95.4 kg)  03/28/24 210 lb (95.3 kg)  03/22/24 209 lb 3.2 oz (94.9 kg)      Physical Exam Vitals and nursing note reviewed.  Constitutional:      General: He is not in acute distress.    Appearance: Normal appearance. He is well-developed. He is not ill-appearing.  HENT:     Head: Normocephalic and atraumatic.     Right Ear: Hearing, tympanic membrane, ear canal and external ear normal.     Left Ear: Hearing, tympanic membrane, ear canal and external ear normal.     Mouth/Throat:     Mouth: Mucous membranes are moist.     Pharynx: Oropharynx is clear. No oropharyngeal exudate or posterior oropharyngeal erythema.  Eyes:     General: No scleral icterus.    Extraocular Movements: Extraocular movements intact.     Conjunctiva/sclera: Conjunctivae normal.     Pupils: Pupils are equal, round, and reactive to light.  Neck:     Thyroid : No thyroid  mass or thyromegaly.     Vascular: Carotid bruit (L>R) present.  Cardiovascular:     Rate and Rhythm: Normal rate and regular rhythm.     Pulses: Normal pulses.          Radial pulses are 2+ on the right side and 2+ on the left side.     Heart sounds: Murmur (3/6 systolic USB) heard.  Pulmonary:     Effort: Pulmonary effort is normal. No respiratory distress.     Breath sounds: Normal breath sounds. No wheezing, rhonchi or rales.  Abdominal:     General: Bowel sounds are normal. There  is no distension.     Palpations: Abdomen is soft. There is no mass.      Tenderness: There is no abdominal tenderness. There is no guarding or rebound.     Hernia: No hernia is present.  Musculoskeletal:        General: Normal range of motion.     Cervical back: Normal range of motion and neck supple.     Right lower leg: No edema.     Left lower leg: No edema.  Lymphadenopathy:     Cervical: No cervical adenopathy.  Skin:    General: Skin is warm and dry.     Findings: Bruising present. No rash.  Neurological:     General: No focal deficit present.     Mental Status: He is alert and oriented to person, place, and time.  Psychiatric:        Mood and Affect: Mood normal.        Behavior: Behavior normal.        Thought Content: Thought content normal.        Judgment: Judgment normal.       Results for orders placed or performed in visit on 03/28/24  Culture, Urine   Collection Time: 03/28/24 12:07 PM   Specimen: Urine, Clean Catch  Result Value Ref Range   Specimen Description      URINE, CLEAN CATCH Performed at [facility], [address], [place], [place] [address]    Special Requests      NONE Performed at [facility], [address], [place], [place] [address]    Culture      NO GROWTH Performed at [facility], [address], [place], [place] [address]    Report Status 03/29/2024 FINAL   Urinalysis, Complete w Microscopic   Collection Time: 03/28/24 12:07 PM  Result Value Ref Range   Color, Urine AMBER (A) YELLOW   APPearance CLEAR CLEAR   Specific Gravity, Urine 1.011 1.005 - 1.030   pH 6.0 5.0 - 8.0   Glucose, UA NEGATIVE NEGATIVE mg/dL   Hgb urine dipstick NEGATIVE NEGATIVE   Bilirubin Urine NEGATIVE NEGATIVE   Ketones, ur NEGATIVE NEGATIVE mg/dL   Protein, ur NEGATIVE NEGATIVE mg/dL   Nitrite POSITIVE (A) NEGATIVE   Leukocytes,Ua NEGATIVE NEGATIVE   RBC / HPF 0-5 0 - 5 RBC/hpf   WBC, UA 0-5 0 - 5 WBC/hpf   Bacteria, UA NONE SEEN NONE SEEN   Squamous Epithelial /  HPF 0-5 0 - 5 /HPF   Mucus PRESENT    Hyaline Casts, UA PRESENT   CMP (Cancer Center only)   Collection Time: 03/28/24 12:07 PM  Result Value Ref Range   Sodium 138 135 - 145 mmol/L   Potassium 4.4 3.5 - 5.1 mmol/L   Chloride 103 98 - 111 mmol/L   CO2 29 22 - 32 mmol/L   Glucose, Bld 175 (H) 70 - 99 mg/dL   BUN 39 (H) 8 - 23 mg/dL   Creatinine 8.24 (H) 9.38 - 1.24 mg/dL   Calcium  9.1 8.9 - 10.3 mg/dL   Total Protein 6.5 6.5 - 8.1 g/dL   Albumin  4.3 3.5 - 5.0 g/dL   AST 14 (L) 15 - 41 U/L   ALT 12 0 - 44 U/L   Alkaline Phosphatase 33 (L) 38 - 126 U/L   Total Bilirubin 0.6 0.0 - 1.2 mg/dL   GFR, Estimated 38 (L) >60 mL/min   Anion gap  6 5 - 15  CBC with Differential (Cancer Center Only)   Collection Time: 03/28/24 12:07 PM  Result Value Ref Range   WBC Count 7.5 4.0 - 10.5 K/uL   RBC 3.97 (L) 4.22 - 5.81 MIL/uL   Hemoglobin 12.8 (L) 13.0 - 17.0 g/dL   HCT 62.6 (L) 60.9 - 47.9 %   MCV 94.0 80.0 - 100.0 fL   MCH 32.2 26.0 - 34.0 pg   MCHC 34.3 30.0 - 36.0 g/dL   RDW 86.7 88.4 - 84.4 %   Platelet Count 175 150 - 400 K/uL   nRBC 0.0 0.0 - 0.2 %   Neutrophils Relative % 87 %   Neutro Abs 6.5 1.7 - 7.7 K/uL   Lymphocytes Relative 10 %   Lymphs Abs 0.8 0.7 - 4.0 K/uL   Monocytes Relative 2 %   Monocytes Absolute 0.2 0.1 - 1.0 K/uL   Eosinophils Relative 1 %   Eosinophils Absolute 0.1 0.0 - 0.5 K/uL   Basophils Relative 0 %   Basophils Absolute 0.0 0.0 - 0.1 K/uL   Immature Granulocytes 0 %   Abs Immature Granulocytes 0.01 0.00 - 0.07 K/uL   *Note: Due to a large number of results and/or encounters for the requested time period, some results have not been displayed. A complete set of results can be found in Results Review.   Lab Results  Component Value Date   CHOL 108 03/22/2024   HDL 33.30 (L) 03/22/2024   LDLCALC 58 03/22/2024   LDLDIRECT 106.0 01/01/2016   TRIG 85.0 03/22/2024   CHOLHDL 3 03/22/2024    Lab Results  Component Value Date   TSH 2.77 02/16/2021    Lab Results  Component Value Date   VITAMINB12 1,121 (H) 03/22/2024    Lab Results  Component Value Date   VD25OH 45.85 03/22/2024    Lab Results  Component Value Date   HGBA1C 6.6 (H) 03/22/2024     Assessment & Plan:   Problem List Items Addressed This Visit     Advanced care planning/counseling discussion (Chronic)   Previously discussed scanned 05/2019      Encounter for general adult medical examination with abnormal findings - Primary (Chronic)   Preventative protocols reviewed and updated unless pt declined. Discussed healthy diet and lifestyle.       Hyperlipidemia associated with type 2 diabetes mellitus (HCC)   Chronic, stable on current regimen - continue.  The ASCVD Risk score (Arnett DK, et al., 2019) failed to calculate for the following reasons:   The 2019 ASCVD risk score is only valid for ages 17 to 62   Risk score cannot be calculated because patient has a medical history suggesting prior/existing ASCVD       Essential hypertension   Chronic, stable period on current regimen - continue      Coronary artery disease involving native coronary artery of native heart with unstable angina pectoris (HCC)   Followed by cardiology Continue statin, zetia , aspirin , ranexa .       PAD (peripheral artery disease) (HCC)   Followed by cardiology Dr [name].       GERD   Only managed on pepcid , off PPI      Type 2 diabetes mellitus with other specified complication (HCC)   Chronic, diet controlled.  Notes increased sugar levels after hip steroid injection.       Low vitamin B12 level   Latest levels high - he will hold b12 replacement at this time  Lumbar stenosis with neurogenic claudication   Chronic followed by neurosurgery and PM&R, just completed L hip  steroid injection       Asthma-COPD overlap syndrome (HCC)   Continue Trelegy followed by pulmonology.       Peripheral neuropathy   Chronic presumed diabetic as well as lumbar disease  related - managed with gabapentin  300/300/600 mg      Relevant Medications   gabapentin  (NEURONTIN ) 600 MG tablet   CKD (chronic kidney disease) stage 3, GFR 30-59 ml/min (HCC)   Chronic stable period - continue to monitor.       OSA (obstructive sleep apnea)   Continue CPAP followed by pulmonology.       Chronic heart failure with preserved ejection fraction (HFpEF) (HCC)   Followed by cardiology on Toprol  XL spironolactone  losartan  and PRN lasix .      Urothelial carcinoma of bladder [facility])   Appreciate oncology and urology care.  First completed BCG instillations, now receiving intravesical gemcitabine  + docetaxel , just completed treatment with planned f/u cystoscopy 05/2024.       Carotid stenosis, asymptomatic, bilateral   Mild-mod bilat carotid stenosis - updated carotid US  pending.       Systolic murmur   Mild MR, mild-mod AS on latest echo 01/2024.         Meds ordered this encounter  Medications   gabapentin  (NEURONTIN ) 600 MG tablet    Sig: Take 0.5 tablets (300 mg total) by mouth 2 (two) times daily AND 1 tablet (600 mg total) at bedtime.    Dispense:  180 tablet    Refill:  3    No orders of the defined types were placed in this encounter.   Patient Instructions  Keep carotid ultrasound appointment for later this month Happy early birthday!  Good to see you today   Return as needed or in 6 months for follow up visit   Follow up plan: Return in about 6 months (around 09/29/2024) for follow up visit.  Anton Blas, MD

## 2024-03-29 NOTE — Assessment & Plan Note (Signed)
 Preventative protocols reviewed and updated unless pt declined. Discussed healthy diet and lifestyle.

## 2024-03-29 NOTE — Assessment & Plan Note (Signed)
 Previously discussed scanned 05/2019

## 2024-03-30 ENCOUNTER — Encounter: Payer: Self-pay | Admitting: Family Medicine

## 2024-03-30 DIAGNOSIS — I6523 Occlusion and stenosis of bilateral carotid arteries: Secondary | ICD-10-CM | POA: Insufficient documentation

## 2024-03-30 DIAGNOSIS — R011 Cardiac murmur, unspecified: Secondary | ICD-10-CM | POA: Insufficient documentation

## 2024-03-30 NOTE — Addendum Note (Signed)
 Addended by: RILLA BALLER on: 03/30/2024 09:41 AM   Modules accepted: Level of Service

## 2024-03-30 NOTE — Assessment & Plan Note (Signed)
 Mild-mod bilat carotid stenosis - updated carotid US  pending.

## 2024-03-30 NOTE — Assessment & Plan Note (Signed)
 Followed by cardiology on Toprol  XL spironolactone  losartan  and PRN lasix .

## 2024-03-30 NOTE — Assessment & Plan Note (Signed)
 Followed by cardiology Continue statin, zetia , aspirin , ranexa .

## 2024-03-30 NOTE — Assessment & Plan Note (Addendum)
 Followed by cardiology Dr Kirke Corin

## 2024-03-30 NOTE — Assessment & Plan Note (Signed)
 Continue Trelegy followed by pulmonology.

## 2024-03-30 NOTE — Assessment & Plan Note (Signed)
 Chronic, diet controlled.  Notes increased sugar levels after hip steroid injection.

## 2024-03-30 NOTE — Assessment & Plan Note (Signed)
 Only managed on pepcid , off PPI

## 2024-03-30 NOTE — Assessment & Plan Note (Signed)
 Chronic stable period - continue to monitor.

## 2024-03-30 NOTE — Assessment & Plan Note (Signed)
 Chronic followed by neurosurgery and PM&R, just completed L hip  steroid injection

## 2024-03-30 NOTE — Assessment & Plan Note (Signed)
 Mild MR, mild-mod AS on latest echo 01/2024.

## 2024-03-30 NOTE — Assessment & Plan Note (Signed)
 Chronic, stable period on current regimen - continue

## 2024-03-30 NOTE — Assessment & Plan Note (Signed)
 Chronic, stable on current regimen - continue.  The ASCVD Risk score (Arnett DK, et al., 2019) failed to calculate for the following reasons:   The 2019 ASCVD risk score is only valid for ages 55 to 83   Risk score cannot be calculated because patient has a medical history suggesting prior/existing ASCVD

## 2024-03-30 NOTE — Assessment & Plan Note (Addendum)
 Chronic presumed diabetic as well as lumbar disease related - managed with gabapentin  300/300/600 mg

## 2024-03-30 NOTE — Assessment & Plan Note (Signed)
 Continue CPAP followed by pulmonology.

## 2024-03-30 NOTE — Assessment & Plan Note (Signed)
 Appreciate oncology and urology care.  First completed BCG instillations, now receiving intravesical gemcitabine  + docetaxel , just completed treatment with planned f/u cystoscopy 05/2024.

## 2024-03-30 NOTE — Assessment & Plan Note (Signed)
 Latest levels high - he will hold b12 replacement at this time

## 2024-04-01 ENCOUNTER — Ambulatory Visit: Admitting: Physical Therapy

## 2024-04-02 DIAGNOSIS — M7072 Other bursitis of hip, left hip: Secondary | ICD-10-CM | POA: Diagnosis not present

## 2024-04-03 ENCOUNTER — Ambulatory Visit: Admitting: Physical Therapy

## 2024-04-04 NOTE — Assessment & Plan Note (Addendum)
 Continue gem/doce Return on week 5 next week Fridays works well.

## 2024-04-04 NOTE — Progress Notes (Unsigned)
  [facility] Center OFFICE PROGRESS NOTE  Patient Care Team: Rilla Baller, MD as PCP - General (Family Medicine) Perla, Evalene PARAS, MD as PCP - Cardiology (Cardiology) Myra Rosaline FALCON, [facility] (Inactive) as Pharmacist (Pharmacist) Perla Evalene PARAS, MD as Consulting Physician (Cardiology) Tamea Dedra CROME, MD as Consulting Physician (Pulmonary Disease) Darron Deatrice LABOR, MD as Consulting Physician (Cardiology) Pa, [facility] (Ophthalmology) Tina Pauletta BROCKS, MD as Consulting Physician (Oncology)  Klayten is a 83 y.o.male with history of DVT, CKD 3, CAD with MI, CABG, emphysema, hypertension, hyperlipidemia, type 2 diabetes, carotid artery disease, BPH, DDD 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   Patient started intravesical gemcitabine  with docetaxel  without issue. Now week 4. Tolerating well. Assessment & Plan Urothelial carcinoma of bladder (HCC) Continue gem/doce Return on week 5 next week Fridays works well.  Pauletta BROCKS Tina, MD  INTERVAL HISTORY: Patient returns for follow-up.  He is feeling well.  He has no urinary symptoms.  Oncology History  Urothelial carcinoma of bladder (HCC)  10/19/2021 Pathology Results   DIAGNOSIS:  A. URINARY BLADDER TUMOR; TRANSURETHRAL RESECTION:  - NONINVASIVE PAPILLARY UROTHELIAL CARCINOMA, HIGH-GRADE (WHO/ISUP).  - MUSCULARIS PROPRIA PRESENT AND UNINVOLVED.   B. URINARY BLADDER, TUMOR BASE; TRANSURETHRAL RESECTION:  - BENIGN MUSCULARIS PROPRIA.  - NO DEFINITE INTACT UROTHELIUM IDENTIFIED.  - NEGATIVE FOR MALIGNANCY.   C. URINARY BLADDER, LEFT POSTERIOR WALL; TRANSURETHRAL RESECTION:  - NONINVASIVE PAPILLARY UROTHELIAL CARCINOMA, LOW-GRADE (WHO/ISUP).  - NO MUSCULARIS PROPRIA IDENTIFIED.    11/12/2021 Initial Diagnosis   Urothelial carcinoma of bladder (HCC)   11/15/2022 Pathology Results   IAGNOSIS:  A. URINARY BLADDER, RIGHT LATERAL WALL; TRANSURETHRAL  RESECTION:  - NONINVASIVE PAPILLARY UROTHELIAL CARCINOMA, HIGH-GRADE (WHO/ISUP).  - NO MUSCULARIS PROPRIA IDENTIFIED.   B. URINARY BLADDER TUMOR BASE, RIGHT LATERAL WALL; TRANSURETHRAL BIOPSY:  - PARTIALLY DENUDED, BENIGN APPEARING UROTHELIAL TISSUE.  - MUSCULARIS PROPRIA PRESENT AND UNINVOLVED.   C. URINARY BLADDER, LEFT LATERAL WALL; TRANSURETHRAL RESECTION:  - NONINVASIVE PAPILLARY UROTHELIAL CARCINOMA, LOW-GRADE (WHO/ISUP).  - NO MUSCULARIS PROPRIA IDENTIFIED.    01/16/2024 Pathology Results   1. 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,    03/15/2024 -  Chemotherapy   Patient is on Treatment Plan : BLADDER Gemcitabine  (1000), Docetaxel  (37.5) INTRAVESICAL q7d     03/15/2024 Cancer Staging   Staging form: Urinary Bladder, AJCC 8th Edition - Clinical: Stage 0is (cTis, cN0, cM0) - Signed by Tina Pauletta BROCKS, MD on 03/15/2024 WHO/ISUP grade (low/high): High Grade Histologic grading system: 2 grade system      PHYSICAL EXAMINATION: ECOG PERFORMANCE STATUS: 1 - Symptomatic but completely ambulatory  Vitals:   04/05/24 0929  BP: 120/82  Pulse: 61  Resp: 17  Temp: 97.6 F (36.4 C)  SpO2: 97%   Filed Weights   04/05/24 0929  Weight: 215 lb 3.2 oz (97.6 kg)   General: No acute distress.

## 2024-04-05 ENCOUNTER — Ambulatory Visit

## 2024-04-05 ENCOUNTER — Inpatient Hospital Stay

## 2024-04-05 ENCOUNTER — Other Ambulatory Visit

## 2024-04-05 VITALS — BP 122/50 | HR 60 | Resp 18

## 2024-04-05 VITALS — BP 120/82 | HR 61 | Temp 97.6°F | Resp 17 | Ht 72.0 in | Wt 215.2 lb

## 2024-04-05 DIAGNOSIS — C679 Malignant neoplasm of bladder, unspecified: Secondary | ICD-10-CM

## 2024-04-05 DIAGNOSIS — Z5111 Encounter for antineoplastic chemotherapy: Secondary | ICD-10-CM | POA: Diagnosis not present

## 2024-04-05 MED ORDER — OXYBUTYNIN CHLORIDE 5 MG PO TABS: 5.0000 mg | ORAL_TABLET | Freq: Once | ORAL | Status: DC | PRN

## 2024-04-05 MED ORDER — HEPARIN SOD (PORK) LOCK FLUSH 100 UNIT/ML IV SOLN: 500.0000 [IU] | Freq: Once | INTRAVENOUS | Status: DC | PRN

## 2024-04-05 MED ORDER — SODIUM CHLORIDE (PF) 0.9 % IJ SOLN
37.5000 mg | Freq: Once | INTRAVENOUS | Status: AC
  Administered 2024-04-05: 37.5 mg via INTRAVESICAL
  Filled 2024-04-05: qty 3.75

## 2024-04-05 MED ORDER — SODIUM CHLORIDE 0.9 % IV SOLN: INTRAVENOUS | Status: DC

## 2024-04-05 MED ORDER — OXYBUTYNIN CHLORIDE 5 MG PO TABS
5.0000 mg | ORAL_TABLET | Freq: Once | ORAL | Status: AC
  Administered 2024-04-05: 5 mg via ORAL
  Filled 2024-04-05: qty 1

## 2024-04-05 MED ORDER — SODIUM CHLORIDE (PF) 0.9 % IJ SOLN
1000.0000 mg | Freq: Once | INTRAVENOUS | Status: AC
  Administered 2024-04-05: 1000 mg via INTRAVESICAL
  Filled 2024-04-05: qty 26.3

## 2024-04-05 MED ORDER — PROCHLORPERAZINE MALEATE 10 MG PO TABS
10.0000 mg | ORAL_TABLET | Freq: Once | ORAL | Status: AC
  Administered 2024-04-05: 10 mg via ORAL
  Filled 2024-04-05: qty 1

## 2024-04-05 MED ORDER — SODIUM CHLORIDE 0.9% FLUSH: 10.0000 mL | INTRAVENOUS | Status: DC | PRN

## 2024-04-05 NOTE — Patient Instructions (Signed)
 [facility] - A DEPT OF [facility]  Discharge Instructions: Thank you for choosing [facility] to provide your oncology and hematology care.   If you have a lab appointment with the Cancer Center, please go directly to the Cancer Center and check in at the registration area.   Wear comfortable clothing and clothing appropriate for easy access to any Portacath or PICC line.   We strive to give you quality time with your provider. You may need to reschedule your appointment if you arrive late (15 or more minutes).  Arriving late affects you and other patients whose appointments are after yours.  Also, if you miss three or more appointments without notifying the office, you may be dismissed from the clinic at the provider's discretion.      For prescription refill requests, have your pharmacy contact our office and allow 72 hours for refills to be completed.    Today you received the following chemotherapy and/or immunotherapy agents: Gemcitabine , Docetaxel .       To help prevent nausea and vomiting after your treatment, we encourage you to take your nausea medication as directed.  BELOW ARE SYMPTOMS THAT SHOULD BE REPORTED IMMEDIATELY: *FEVER GREATER THAN 100.4 F (38 C) OR HIGHER *CHILLS OR SWEATING *NAUSEA AND VOMITING THAT IS NOT CONTROLLED WITH YOUR NAUSEA MEDICATION *UNUSUAL SHORTNESS OF BREATH *UNUSUAL BRUISING OR BLEEDING *URINARY PROBLEMS (pain or burning when urinating, or frequent urination) *BOWEL PROBLEMS (unusual diarrhea, constipation, pain near the anus) TENDERNESS IN MOUTH AND THROAT WITH OR WITHOUT PRESENCE OF ULCERS (sore throat, sores in mouth, or a toothache) UNUSUAL RASH, SWELLING OR PAIN  UNUSUAL VAGINAL DISCHARGE OR ITCHING   Items with * indicate a potential emergency and should be followed up as soon as possible or go to the Emergency Department if any problems should occur.  Please show the CHEMOTHERAPY ALERT CARD or  IMMUNOTHERAPY ALERT CARD at check-in to the Emergency Department and triage nurse.  Should you have questions after your visit or need to cancel or reschedule your appointment, please contact [facility] - A DEPT OF [facility]  Dept: [phone]  and follow the prompts.  Office hours are 8:00 a.m. to 4:30 p.m. Monday - Friday. Please note that voicemails left after 4:00 p.m. may not be returned until the following business day.  We are closed weekends and major holidays. You have access to a nurse at all times for urgent questions. Please call the main number to the clinic Dept: [phone] and follow the prompts.   For any non-urgent questions, you may also contact your provider using MyChart. We now offer e-Visits for anyone 35 and older to request care online for non-urgent symptoms. For details visit [email or web address].   Also download the MyChart app! Go to the app store, search "MyChart", open the app, select , and log in with your MyChart username and password.

## 2024-04-09 ENCOUNTER — Encounter: Admitting: Physical Therapy

## 2024-04-10 ENCOUNTER — Ambulatory Visit: Attending: Cardiovascular Disease

## 2024-04-10 ENCOUNTER — Ambulatory Visit

## 2024-04-10 ENCOUNTER — Ambulatory Visit (INDEPENDENT_AMBULATORY_CARE_PROVIDER_SITE_OTHER)

## 2024-04-10 DIAGNOSIS — E785 Hyperlipidemia, unspecified: Secondary | ICD-10-CM | POA: Diagnosis not present

## 2024-04-10 DIAGNOSIS — I2510 Atherosclerotic heart disease of native coronary artery without angina pectoris: Secondary | ICD-10-CM | POA: Diagnosis not present

## 2024-04-10 DIAGNOSIS — I739 Peripheral vascular disease, unspecified: Secondary | ICD-10-CM | POA: Diagnosis not present

## 2024-04-10 DIAGNOSIS — I6523 Occlusion and stenosis of bilateral carotid arteries: Secondary | ICD-10-CM

## 2024-04-10 DIAGNOSIS — M7072 Other bursitis of hip, left hip: Secondary | ICD-10-CM | POA: Diagnosis not present

## 2024-04-11 ENCOUNTER — Encounter: Admitting: Physical Therapy

## 2024-04-11 NOTE — Assessment & Plan Note (Signed)
 Continue gem/doce Sodium bicarb the night before and the morning of treatment Pyridium  200 mg as needed 3 times a day on day 1 and 2 if having urinary pain Return on week 6 next week Fridays works well. Next treatment on 10/3, and 11/3.  Will communicate with Dr. Twylla.

## 2024-04-11 NOTE — Progress Notes (Unsigned)
 [facility] OFFICE PROGRESS NOTE  Patient Care Team: Rilla Baller, MD as PCP - General (Family Medicine) Perla, Evalene PARAS, MD as PCP - Cardiology (Cardiology) Myra Rosaline FALCON, [facility] (Inactive) as Pharmacist (Pharmacist) Perla Evalene PARAS, MD as Consulting Physician (Cardiology) Tamea Dedra CROME, MD as Consulting Physician (Pulmonary Disease) Darron Deatrice LABOR, MD as Consulting Physician (Cardiology) Pa, [facility] (Ophthalmology) Tina Pauletta BROCKS, MD as Consulting Physician (Oncology)  [name] is a 83 y.o.male with history of DVT, CKD 3, CAD with MI, CABG, emphysema, hypertension, hyperlipidemia, type 2 diabetes, carotid artery disease, BPH, DDD 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   Patient started intravesical gemcitabine  with docetaxel  without issue. Now week 5. Tolerating well. Assessment & Plan Urothelial carcinoma of bladder (HCC) Continue gem/doce Sodium bicarb the night before and the morning of treatment Pyridium  200 mg as needed 3 times a day on day 1 and 2 if having urinary pain Return on week 6 next week Fridays works well. Next treatment on 10/3, and 11/3.  Will communicate with Dr. Twylla. Stage 3b chronic kidney disease (HCC) Fluid restrictions only in the morning before treatment.  Advised to increase fluid after treatment, and goal of 64 ounces everyday    Pauletta BROCKS Tina, MD  INTERVAL HISTORY: Patient returns for follow-up.  Overall doing well.  No pain on urination, hematuria no issues.  Oncology History  Urothelial carcinoma of bladder (HCC)  10/19/2021 Pathology Results   DIAGNOSIS:  A. URINARY BLADDER TUMOR; TRANSURETHRAL RESECTION:  - NONINVASIVE PAPILLARY UROTHELIAL CARCINOMA, HIGH-GRADE (WHO/ISUP).  - MUSCULARIS PROPRIA PRESENT AND UNINVOLVED.   B. URINARY BLADDER, TUMOR BASE; TRANSURETHRAL RESECTION:  - BENIGN MUSCULARIS PROPRIA.  - NO DEFINITE INTACT UROTHELIUM  IDENTIFIED.  - NEGATIVE FOR MALIGNANCY.   C. URINARY BLADDER, LEFT POSTERIOR WALL; TRANSURETHRAL RESECTION:  - NONINVASIVE PAPILLARY UROTHELIAL CARCINOMA, LOW-GRADE (WHO/ISUP).  - NO MUSCULARIS PROPRIA IDENTIFIED.    11/12/2021 Initial Diagnosis   Urothelial carcinoma of bladder (HCC)   11/15/2022 Pathology Results   IAGNOSIS:  A. URINARY BLADDER, RIGHT LATERAL WALL; TRANSURETHRAL RESECTION:  - NONINVASIVE PAPILLARY UROTHELIAL CARCINOMA, HIGH-GRADE (WHO/ISUP).  - NO MUSCULARIS PROPRIA IDENTIFIED.   B. URINARY BLADDER TUMOR BASE, RIGHT LATERAL WALL; TRANSURETHRAL BIOPSY:  - PARTIALLY DENUDED, BENIGN APPEARING UROTHELIAL TISSUE.  - MUSCULARIS PROPRIA PRESENT AND UNINVOLVED.   C. URINARY BLADDER, LEFT LATERAL WALL; TRANSURETHRAL RESECTION:  - NONINVASIVE PAPILLARY UROTHELIAL CARCINOMA, LOW-GRADE (WHO/ISUP).  - NO MUSCULARIS PROPRIA IDENTIFIED.    01/16/2024 Pathology Results   1. 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,    03/15/2024 -  Chemotherapy   Patient is on Treatment Plan : BLADDER Gemcitabine  (1000), Docetaxel  (37.5) INTRAVESICAL q7d     03/15/2024 Cancer Staging   Staging form: Urinary Bladder, AJCC 8th Edition - Clinical: Stage 0is (cTis, cN0, cM0) - Signed by Tina Pauletta BROCKS, MD on 03/15/2024 WHO/ISUP grade (low/high): High Grade Histologic grading system: 2 grade system      PHYSICAL EXAMINATION: ECOG PERFORMANCE STATUS: 2 - Symptomatic, <50% confined to bed  Vitals:   04/12/24 0914  BP: (!) 137/57  Pulse: 65  Resp: 17  Temp: (!) 97.2 F (36.2 C)  SpO2: 100%   Filed Weights   04/12/24 0914  Weight: 212 lb 4.8 oz (96.3 kg)   General: No acute distress.  Relevant data reviewed during this visit included labs.

## 2024-04-12 ENCOUNTER — Inpatient Hospital Stay

## 2024-04-12 VITALS — BP 137/57 | HR 65 | Temp 97.2°F | Resp 17 | Wt 212.3 lb

## 2024-04-12 DIAGNOSIS — N1832 Chronic kidney disease, stage 3b: Secondary | ICD-10-CM | POA: Diagnosis not present

## 2024-04-12 DIAGNOSIS — C679 Malignant neoplasm of bladder, unspecified: Secondary | ICD-10-CM

## 2024-04-12 DIAGNOSIS — Z5111 Encounter for antineoplastic chemotherapy: Secondary | ICD-10-CM | POA: Diagnosis not present

## 2024-04-12 LAB — URINALYSIS, COMPLETE (UACMP) WITH MICROSCOPIC
Bacteria, UA: NONE SEEN
Bilirubin Urine: NEGATIVE
Glucose, UA: NEGATIVE mg/dL
Hgb urine dipstick: NEGATIVE
Ketones, ur: NEGATIVE mg/dL
Leukocytes,Ua: NEGATIVE
Nitrite: POSITIVE — AB
Protein, ur: NEGATIVE mg/dL
Specific Gravity, Urine: 1.018 (ref 1.005–1.030)
pH: 5 (ref 5.0–8.0)

## 2024-04-12 LAB — CBC WITH DIFFERENTIAL (CANCER CENTER ONLY)
Abs Immature Granulocytes: 0.03 K/uL (ref 0.00–0.07)
Basophils Absolute: 0 K/uL (ref 0.0–0.1)
Basophils Relative: 0 %
Eosinophils Absolute: 0.1 K/uL (ref 0.0–0.5)
Eosinophils Relative: 1 %
HCT: 34.6 % — ABNORMAL LOW (ref 39.0–52.0)
Hemoglobin: 11.6 g/dL — ABNORMAL LOW (ref 13.0–17.0)
Immature Granulocytes: 0 %
Lymphocytes Relative: 20 %
Lymphs Abs: 1.5 K/uL (ref 0.7–4.0)
MCH: 32.2 pg (ref 26.0–34.0)
MCHC: 33.5 g/dL (ref 30.0–36.0)
MCV: 96.1 fL (ref 80.0–100.0)
Monocytes Absolute: 0.7 K/uL (ref 0.1–1.0)
Monocytes Relative: 9 %
Neutro Abs: 5.4 K/uL (ref 1.7–7.7)
Neutrophils Relative %: 70 %
Platelet Count: 156 K/uL (ref 150–400)
RBC: 3.6 MIL/uL — ABNORMAL LOW (ref 4.22–5.81)
RDW: 14 % (ref 11.5–15.5)
WBC Count: 7.7 K/uL (ref 4.0–10.5)
nRBC: 0 % (ref 0.0–0.2)

## 2024-04-12 LAB — CMP (CANCER CENTER ONLY)
ALT: 11 U/L (ref 0–44)
AST: 14 U/L — ABNORMAL LOW (ref 15–41)
Albumin: 4 g/dL (ref 3.5–5.0)
Alkaline Phosphatase: 32 U/L — ABNORMAL LOW (ref 38–126)
Anion gap: 7 (ref 5–15)
BUN: 43 mg/dL — ABNORMAL HIGH (ref 8–23)
CO2: 28 mmol/L (ref 22–32)
Calcium: 9 mg/dL (ref 8.9–10.3)
Chloride: 104 mmol/L (ref 98–111)
Creatinine: 1.81 mg/dL — ABNORMAL HIGH (ref 0.61–1.24)
GFR, Estimated: 37 mL/min — ABNORMAL LOW (ref >60)
Glucose, Bld: 120 mg/dL — ABNORMAL HIGH (ref 70–99)
Potassium: 4.1 mmol/L (ref 3.5–5.1)
Sodium: 139 mmol/L (ref 135–145)
Total Bilirubin: 0.5 mg/dL (ref 0.0–1.2)
Total Protein: 6.3 g/dL — ABNORMAL LOW (ref 6.5–8.1)

## 2024-04-12 MED ORDER — OXYBUTYNIN CHLORIDE 5 MG PO TABS
5.0000 mg | ORAL_TABLET | Freq: Once | ORAL | Status: DC | PRN
Start: 2024-04-12 — End: 2024-04-12

## 2024-04-12 MED ORDER — LIDOCAINE HCL URETHRAL/MUCOSAL 2 % EX GEL
1.0000 | Freq: Once | CUTANEOUS | Status: AC
  Administered 2024-04-12: 1 via URETHRAL
  Filled 2024-04-12: qty 1

## 2024-04-12 MED ORDER — OXYBUTYNIN CHLORIDE 5 MG PO TABS
5.0000 mg | ORAL_TABLET | Freq: Once | ORAL | Status: AC
  Administered 2024-04-12: 5 mg via ORAL
  Filled 2024-04-12: qty 1

## 2024-04-12 MED ORDER — SODIUM CHLORIDE (PF) 0.9 % IJ SOLN
1000.0000 mg | Freq: Once | INTRAVENOUS | Status: DC
  Filled 2024-04-12: qty 26.3

## 2024-04-12 MED ORDER — PROCHLORPERAZINE MALEATE 10 MG PO TABS
10.0000 mg | ORAL_TABLET | Freq: Once | ORAL | Status: AC
Start: 2024-04-12 — End: 2024-04-12
  Administered 2024-04-12: 10 mg via ORAL
  Filled 2024-04-12: qty 1

## 2024-04-12 MED ORDER — SODIUM CHLORIDE (PF) 0.9 % IJ SOLN
37.5000 mg | Freq: Once | INTRAVENOUS | Status: AC
  Administered 2024-04-12: 37.5 mg via INTRAVESICAL
  Filled 2024-04-12: qty 3.75

## 2024-04-12 MED ORDER — SODIUM CHLORIDE (PF) 0.9 % IJ SOLN
1000.0000 mg | Freq: Once | INTRAVENOUS | Status: AC
  Administered 2024-04-12: 1000 mg via INTRAVESICAL
  Filled 2024-04-12: qty 26.3

## 2024-04-12 MED ORDER — SODIUM CHLORIDE (PF) 0.9 % IJ SOLN
37.5000 mg | Freq: Once | INTRAVENOUS | Status: DC
  Filled 2024-04-12: qty 3.75

## 2024-04-12 NOTE — Patient Instructions (Signed)
 [facility] - A DEPT OF [facility]  Discharge Instructions: Thank you for choosing [facility] to provide your oncology and hematology care.   If you have a lab appointment with the Cancer Center, please go directly to the Cancer Center and check in at the registration area.   Wear comfortable clothing and clothing appropriate for easy access to any Portacath or PICC line.   We strive to give you quality time with your provider. You may need to reschedule your appointment if you arrive late (15 or more minutes).  Arriving late affects you and other patients whose appointments are after yours.  Also, if you miss three or more appointments without notifying the office, you may be dismissed from the clinic at the provider's discretion.      For prescription refill requests, have your pharmacy contact our office and allow 72 hours for refills to be completed.    Today you received the following chemotherapy and/or immunotherapy agents gemzar  and taxotere       To help prevent nausea and vomiting after your treatment, we encourage you to take your nausea medication as directed.  BELOW ARE SYMPTOMS THAT SHOULD BE REPORTED IMMEDIATELY: *FEVER GREATER THAN 100.4 F (38 C) OR HIGHER *CHILLS OR SWEATING *NAUSEA AND VOMITING THAT IS NOT CONTROLLED WITH YOUR NAUSEA MEDICATION *UNUSUAL SHORTNESS OF BREATH *UNUSUAL BRUISING OR BLEEDING *URINARY PROBLEMS (pain or burning when urinating, or frequent urination) *BOWEL PROBLEMS (unusual diarrhea, constipation, pain near the anus) TENDERNESS IN MOUTH AND THROAT WITH OR WITHOUT PRESENCE OF ULCERS (sore throat, sores in mouth, or a toothache) UNUSUAL RASH, SWELLING OR PAIN  UNUSUAL VAGINAL DISCHARGE OR ITCHING   Items with * indicate a potential emergency and should be followed up as soon as possible or go to the Emergency Department if any problems should occur.  Please show the CHEMOTHERAPY ALERT CARD or  IMMUNOTHERAPY ALERT CARD at check-in to the Emergency Department and triage nurse.  Should you have questions after your visit or need to cancel or reschedule your appointment, please contact [facility] - A DEPT OF [facility]  Dept: [phone]  and follow the prompts.  Office hours are 8:00 a.m. to 4:30 p.m. Monday - Friday. Please note that voicemails left after 4:00 p.m. may not be returned until the following business day.  We are closed weekends and major holidays. You have access to a nurse at all times for urgent questions. Please call the main number to the clinic Dept: [phone] and follow the prompts.   For any non-urgent questions, you may also contact your provider using MyChart. We now offer e-Visits for anyone 61 and older to request care online for non-urgent symptoms. For details visit [email or web address].   Also download the MyChart app! Go to the app store, search MyChart, open the app, select [facility], and log in with your MyChart username and password.

## 2024-04-12 NOTE — Assessment & Plan Note (Addendum)
 Fluid restrictions only in the morning before treatment.  Advised to increase fluid after treatment, and goal of 64 ounces everyday

## 2024-04-14 LAB — VAS US ABI WITH/WO TBI

## 2024-04-17 ENCOUNTER — Encounter: Admitting: Physical Therapy

## 2024-04-18 ENCOUNTER — Ambulatory Visit

## 2024-04-18 NOTE — Progress Notes (Unsigned)
  Cancer Center OFFICE PROGRESS NOTE  Patient Care Team: Rilla Baller, MD as PCP - General (Family Medicine) Perla, Evalene PARAS, MD as PCP - Cardiology (Cardiology) Myra Rosaline FALCON, [facility] (Inactive) as Pharmacist (Pharmacist) Perla Evalene PARAS, MD as Consulting Physician (Cardiology) Tamea Dedra CROME, MD as Consulting Physician (Pulmonary Disease) Darron Deatrice LABOR, MD as Consulting Physician (Cardiology) Pa, [facility] (Ophthalmology) Tina Pauletta BROCKS, MD as Consulting Physician (Oncology)  Neel is a 83 y.o.male with history of DVT, CKD 3, CAD with MI, CABG, emphysema, hypertension, hyperlipidemia, type 2 diabetes, carotid artery disease, BPH, DDD 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   Patient started intravesical gemcitabine  with docetaxel  without issue. Now week 6. Tolerating well.  Will start maintenance on 10/3. Assessment & Plan Urothelial carcinoma of bladder (HCC) Continue gem/doce Sodium bicarb the night before and the morning of treatment Pyridium  200 mg as needed 3 times a day on day 1 and 2 if having urinary pain Fridays works well. Next treatment on 10/3, and 11/3.  Will communicate with Dr. Twylla. Acute cystitis without hematuria Bactrim  twice daily for 3 days.  Prescription sent to his pharmacy today. She will let us  know if symptoms not improving next week.   Pauletta BROCKS Tina, MD  INTERVAL HISTORY: Patient returns for follow-up.  Reports some burning sensation on urination started yesterday.  No fever, chills.  No bloody urine.  No other new symptoms.  Oncology History  Urothelial carcinoma of bladder (HCC)  10/19/2021 Pathology Results   DIAGNOSIS:  A. URINARY BLADDER TUMOR; TRANSURETHRAL RESECTION:  - NONINVASIVE PAPILLARY UROTHELIAL CARCINOMA, HIGH-GRADE (WHO/ISUP).  - MUSCULARIS PROPRIA PRESENT AND UNINVOLVED.   B. URINARY BLADDER, TUMOR BASE; TRANSURETHRAL RESECTION:  -  BENIGN MUSCULARIS PROPRIA.  - NO DEFINITE INTACT UROTHELIUM IDENTIFIED.  - NEGATIVE FOR MALIGNANCY.   C. URINARY BLADDER, LEFT POSTERIOR WALL; TRANSURETHRAL RESECTION:  - NONINVASIVE PAPILLARY UROTHELIAL CARCINOMA, LOW-GRADE (WHO/ISUP).  - NO MUSCULARIS PROPRIA IDENTIFIED.    11/12/2021 Initial Diagnosis   Urothelial carcinoma of bladder (HCC)   11/15/2022 Pathology Results   IAGNOSIS:  A. URINARY BLADDER, RIGHT LATERAL WALL; TRANSURETHRAL RESECTION:  - NONINVASIVE PAPILLARY UROTHELIAL CARCINOMA, HIGH-GRADE (WHO/ISUP).  - NO MUSCULARIS PROPRIA IDENTIFIED.   B. URINARY BLADDER TUMOR BASE, RIGHT LATERAL WALL; TRANSURETHRAL BIOPSY:  - PARTIALLY DENUDED, BENIGN APPEARING UROTHELIAL TISSUE.  - MUSCULARIS PROPRIA PRESENT AND UNINVOLVED.   C. URINARY BLADDER, LEFT LATERAL WALL; TRANSURETHRAL RESECTION:  - NONINVASIVE PAPILLARY UROTHELIAL CARCINOMA, LOW-GRADE (WHO/ISUP).  - NO MUSCULARIS PROPRIA IDENTIFIED.    01/16/2024 Pathology Results   1. 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,    03/15/2024 -  Chemotherapy   Patient is on Treatment Plan : BLADDER Gemcitabine  (1000), Docetaxel  (37.5) INTRAVESICAL q7d     03/15/2024 Cancer Staging   Staging form: Urinary Bladder, AJCC 8th Edition - Clinical: Stage 0is (cTis, cN0, cM0) - Signed by Tina Pauletta BROCKS, MD on 03/15/2024 WHO/ISUP grade (low/high): High Grade Histologic grading system: 2 grade system      PHYSICAL EXAMINATION: ECOG PERFORMANCE STATUS: 1 - Symptomatic but completely ambulatory  Vitals:   04/19/24 1105 04/19/24 1108  BP: (!) 106/46 (!) 117/48  Pulse: 62   Resp: 16   Temp: (!) 97.3 F (36.3 C)   SpO2: 99%    Filed Weights   04/19/24 1105  Weight: 212 lb (96.2 kg)   General: No acute distress  Relevant data reviewed during this visit included labs.

## 2024-04-19 ENCOUNTER — Inpatient Hospital Stay

## 2024-04-19 VITALS — BP 136/52 | HR 69 | Temp 98.0°F | Resp 18

## 2024-04-19 VITALS — BP 117/48 | HR 62 | Temp 97.3°F | Resp 16 | Ht 72.0 in | Wt 212.0 lb

## 2024-04-19 DIAGNOSIS — Z5111 Encounter for antineoplastic chemotherapy: Secondary | ICD-10-CM | POA: Diagnosis not present

## 2024-04-19 DIAGNOSIS — N3000 Acute cystitis without hematuria: Secondary | ICD-10-CM | POA: Diagnosis not present

## 2024-04-19 DIAGNOSIS — C679 Malignant neoplasm of bladder, unspecified: Secondary | ICD-10-CM | POA: Diagnosis not present

## 2024-04-19 MED ORDER — PROCHLORPERAZINE MALEATE 10 MG PO TABS
10.0000 mg | ORAL_TABLET | Freq: Once | ORAL | Status: AC
  Administered 2024-04-19: 10 mg via ORAL
  Filled 2024-04-19: qty 1

## 2024-04-19 MED ORDER — SULFAMETHOXAZOLE-TRIMETHOPRIM 800-160 MG PO TABS: 1.0000 | ORAL_TABLET | Freq: Two times a day (BID) | ORAL | 0 refills | Status: DC

## 2024-04-19 MED ORDER — SODIUM CHLORIDE (PF) 0.9 % IJ SOLN
37.5000 mg | Freq: Once | INTRAVENOUS | Status: AC
  Administered 2024-04-19: 37.5 mg via INTRAVESICAL
  Filled 2024-04-19: qty 3.75

## 2024-04-19 MED ORDER — SODIUM CHLORIDE (PF) 0.9 % IJ SOLN
1000.0000 mg | Freq: Once | INTRAVENOUS | Status: AC
  Administered 2024-04-19: 1000 mg via INTRAVESICAL
  Filled 2024-04-19: qty 26.3

## 2024-04-19 MED ORDER — OXYBUTYNIN CHLORIDE 5 MG PO TABS
5.0000 mg | ORAL_TABLET | Freq: Once | ORAL | Status: AC
  Administered 2024-04-19: 5 mg via ORAL
  Filled 2024-04-19: qty 1

## 2024-04-19 MED ORDER — OXYBUTYNIN CHLORIDE 5 MG PO TABS: 5.0000 mg | ORAL_TABLET | Freq: Once | ORAL | Status: DC | PRN

## 2024-04-19 MED ORDER — LIDOCAINE HCL URETHRAL/MUCOSAL 2 % EX GEL
1.0000 | Freq: Once | CUTANEOUS | Status: AC
  Administered 2024-04-19: 1 via URETHRAL
  Filled 2024-04-19: qty 1

## 2024-04-19 NOTE — Assessment & Plan Note (Addendum)
 Continue gem/doce Sodium bicarb the night before and the morning of treatment Pyridium  200 mg as needed 3 times a day on day 1 and 2 if having urinary pain Fridays works well. Next treatment on 10/3, and 11/3.  Will communicate with Dr. Twylla.

## 2024-04-20 ENCOUNTER — Ambulatory Visit: Payer: Self-pay | Admitting: Cardiovascular Disease

## 2024-04-22 ENCOUNTER — Encounter: Admitting: Physical Therapy

## 2024-04-22 ENCOUNTER — Telehealth: Payer: Self-pay | Admitting: *Deleted

## 2024-04-22 ENCOUNTER — Other Ambulatory Visit: Payer: Self-pay | Admitting: Emergency Medicine

## 2024-04-22 DIAGNOSIS — I739 Peripheral vascular disease, unspecified: Secondary | ICD-10-CM

## 2024-04-22 NOTE — Telephone Encounter (Signed)
-----   Message from Pauletta JAYSON [place]ihuahua sent at 04/19/2024 11:53 AM EDT ----- Zorita, would you check on him next week sometimes make sure no more dysuria. Thanks.

## 2024-04-22 NOTE — Telephone Encounter (Signed)
 [name] states his urinary symptoms have cleared. Has 1 day left on antibiotics.

## 2024-04-24 ENCOUNTER — Encounter: Payer: Self-pay | Admitting: Pulmonary Disease

## 2024-04-24 ENCOUNTER — Ambulatory Visit: Admitting: Pulmonary Disease

## 2024-04-24 ENCOUNTER — Other Ambulatory Visit: Payer: Self-pay

## 2024-04-24 VITALS — BP 128/66 | HR 67 | Temp 97.7°F | Ht 72.0 in | Wt 210.0 lb

## 2024-04-24 DIAGNOSIS — J4540 Moderate persistent asthma, uncomplicated: Secondary | ICD-10-CM

## 2024-04-24 DIAGNOSIS — G4733 Obstructive sleep apnea (adult) (pediatric): Secondary | ICD-10-CM | POA: Diagnosis not present

## 2024-04-24 DIAGNOSIS — J4489 Other specified chronic obstructive pulmonary disease: Secondary | ICD-10-CM

## 2024-04-24 DIAGNOSIS — Z87891 Personal history of nicotine dependence: Secondary | ICD-10-CM

## 2024-04-24 MED ORDER — DUPIXENT 300 MG/2ML ~~LOC~~ SOAJ: 300.0000 mg | SUBCUTANEOUS | 1 refills | Status: DC

## 2024-04-24 NOTE — Telephone Encounter (Signed)
 Received refill request from [facility].   Refill sent for DUPIXENT  to [facility]: [phone]  Dose: 300mg  Saunemin every 14 days   Last OV: 12/20/23 Provider: Dr. Tamea  Next OV: today (04/24/24)   Aleck Puls, PharmD, BCPS Clinical Pharmacist  [facility]

## 2024-04-24 NOTE — Progress Notes (Signed)
 Subjective:    Patient ID: [name], male    DOB: 1941-03-11, 83 y.o.   MRN: [id number]  Patient Care Team: Rilla Baller, MD as PCP - General (Family Medicine) Perla Evalene PARAS, MD as PCP - Cardiology (Cardiology) Perla Evalene PARAS, MD as Consulting Physician (Cardiology) Tamea Dedra LITTIE, MD as Consulting Physician (Pulmonary Disease) Darron Deatrice LABOR, MD as Consulting Physician (Cardiology) Pa, [facility] (Ophthalmology) Tina Pauletta BROCKS, MD as Consulting Physician (Oncology)  Chief Complaint  Patient presents with   Asthma    Shortness of breath on exertion. Occasional cough.     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 Patient was last seen on 20 Dec 2023.  He was started on Dupixent  on 29 August 2023.   HPI Discussed the use of AI scribe software for clinical note transcription with the patient, who gave verbal consent to proceed.  History of Present Illness   [name] is an 83 year old male with COPD asthma overlap and obstructive sleep apnea who presents for follow-up.  He experiences tiredness that sometimes leads to a sensation of shortness of breath. He is compliant with CPAP therapy and has tried a new mask but reverted to his old one due to discomfort related to his dentures. He occasionally experiences nasal congestion at night and uses nasal sprays for relief.  He mentions dealing with bladder cancer again, he is being followed at [facility]. He also reports issues with his hip and back, and notes that his voice is inconsistent, 'it goes and comes'.  He reports that he has not had much congestion lately. He notes that ragweed has been particularly bad this year due to abundant rain.  He  however seems to be faring fairly well with this.  Compliance download for his CPAP shows 100% compliance over 30 days with 90% compliance of usage over 4 hours per night.  Patient is on AutoSet 5 to 15 cm H2O. pressures range from 9 to 14 cm H2O on the average.  No central events noted.  Residual AHI 3.1.    Review of Systems A 10 point review of systems was performed and it is as noted above otherwise negative.   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   OSA (obstructive sleep apnea)    Chronic heart failure with preserved ejection fraction (HFpEF) (HCC)    Nocturnal hypoxia 06/11/2021   CKD (chronic kidney disease) stage 3, GFR 30-59 ml/min (HCC) 02/24/2021   Peripheral neuropathy 02/23/2021   Normocytic anemia 02/13/2021   Spondylolisthesis of lumbar region 07/07/2020   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   Coccydynia 09/20/2019   Nasal sinus congestion 07/19/2019   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   Anxiety 06/04/2019   Chronic right shoulder pain 09/10/2018   Cervical stenosis of spinal canal 06/05/2017   Peripheral edema  06/05/2017   Encounter for general adult medical examination with abnormal findings 12/06/2016   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/13/2016   Overweight (BMI 25.0-29.9) 07/04/2016   Low vitamin B12 level 01/01/2016   Exertional dyspnea 07/07/2015   Unstable angina (HCC)    Medicare annual wellness visit, subsequent 07/03/2015   Advanced care  planning/counseling discussion 07/03/2015   Type 2 diabetes mellitus with other specified complication (HCC) 05/04/2015   Pleuritic chest pain 05/04/2015   Ex-smoker 05/04/2015   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   Syncopal vertigo 11/04/2010   Lumbar disc disease with radiculopathy 11/04/2010   Serous otitis media 06/07/2010   Coronary artery disease involving native coronary artery of native heart with unstable angina pectoris (HCC) 08/11/2009   PAD (peripheral artery disease) (HCC) 08/11/2009   DUPUYTREN'S CONTRACTURE, RIGHT 10/29/2008   Carotid stenosis 07/09/2008   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/2008   Benign prostatic hyperplasia with urinary obstruction 09/06/2007   Essential hypertension 04/30/2007   GERD 04/30/2007   Hyperlipidemia associated with type 2 diabetes mellitus (HCC) 04/26/2007   OA (osteoarthritis) of knee 04/26/2007   DVT, HX OF 04/26/2007    Social History   Tobacco Use   Smoking status: Former    Current packs/day: 0.00    Average packs/day: 1 pack/day for 25.0 years (25.0 ttl pk-yrs)    Types: Cigarettes    Start date: 08/16/1963    Quit date: 08/15/1988    Years since quitting: 35.7    Passive exposure: Past   Smokeless tobacco: Never  Substance Use Topics   Alcohol use: No    Alcohol/week: 0.0 standard drinks of alcohol    Allergies  Allergen Reactions   Vioxx [Rofecoxib] Other (See Comments)    Hemorrhage    Oxycodone  Other (See Comments)    Could not wake up , felt drunk   Spiriva  Respimat [Tiotropium Bromide Monohydrate ] Other (See Comments)    Elevated bp   Contrast Media [Iodinated Contrast Media] Itching and Rash    Delayed reaction post abdominal aortagram.    Morphine  Nausea Only and Other (See Comments)    Irritability  morphine     Current Meds  Medication Sig   albuterol  (PROVENTIL ) (2.5 MG/3ML) 0.083% nebulizer solution TAKE 3 MLS BY NEBULIZATION EVERY 6 HOURSAS NEEDED FOR WHEEZING OR SHORTNESS OF BREATH   albuterol   (VENTOLIN  HFA) 108 (90 Base) MCG/ACT inhaler TAKE 2 PUFFS INTO LUNGS EVERY 6 HOURS ASNEEDED FOR WHEEZING OR SHORTNESS OF BREATH   aspirin  EC 81 MG EC tablet Take 1 tablet (81 mg total) by mouth daily.   atorvastatin  (LIPITOR) 20 MG tablet TAKE 1 TABLET BY MOUTH DAILY   Cyanocobalamin  (VITAMIN B-12 ER PO) Take 1 capsule by mouth daily.   docusate sodium  (COLACE) 100 MG capsule Take 1 capsule (100 mg total) by mouth daily as needed for mild constipation.   Dupilumab  (DUPIXENT ) 300 MG/2ML SOAJ Inject 300 mg into the skin every 14 (fourteen) days. **patient completed loading dose in clinic**   ezetimibe  (ZETIA ) 10 MG tablet TAKE 1 TABLET BY MOUTH ONCE DAILY   famotidine  (PEPCID ) 20 MG tablet Take 20 mg by mouth daily.   fluorouracil  (EFUDEX ) 5 % cream Apply to nose twice a day x 7 days   fluticasone  (FLONASE ) 50 MCG/ACT nasal spray Place 1 spray into both nostrils daily as needed for allergies or rhinitis.   Fluticasone -Umeclidin-Vilant (TRELEGY ELLIPTA ) 200-62.5-25 MCG/ACT AEPB Inhale 1 puff into the lungs daily.   furosemide  (LASIX ) 40 MG tablet Take 0.5 tablets (20 mg total)  by mouth daily as needed. Take 1 tablet (40 mg) daily as need for weight greater then 208.   gabapentin  (NEURONTIN ) 600 MG tablet Take 0.5 tablets (300 mg total) by mouth 2 (two) times daily AND 1 tablet (600 mg total) at bedtime.   HYDROcodone -acetaminophen  (NORCO/VICODIN) 5-325 MG tablet Take 1-2 tablets by mouth every 6 (six) hours as needed.   hydrocortisone  2.5 % cream Apply to chin and beside the nose at bedtime Tues, Thurs, Sat   isosorbide  mononitrate (IMDUR ) 30 MG 24 hr tablet TAKE 1 TABLET BY MOUTH DAILY.   ketoconazole  (NIZORAL ) 2 % cream Apply to chin and beside the nose at bedtime Mon, Wed, Fri   losartan  (COZAAR ) 25 MG tablet Take 1 tablet (25 mg total) by mouth daily.   metoprolol  succinate (TOPROL  XL) 25 MG 24 hr tablet Take 0.5 tablets (12.5 mg total) by mouth daily.   montelukast  (SINGULAIR ) 10 MG tablet TAKE  1 TABLET BY MOUTH DAILY   mupirocin  ointment (BACTROBAN ) 2 % Apply 1 Application topically 2 (two) times daily.   nitroGLYCERIN  (NITROSTAT ) 0.4 MG SL tablet Place 1 tablet (0.4 mg total) under the tongue every 5 (five) minutes as needed for chest pain.   ondansetron  (ZOFRAN ) 8 MG tablet Take 1 tablet (8 mg total) by mouth every 8 (eight) hours as needed for nausea or vomiting.   phenazopyridine  (PYRIDIUM ) 200 MG tablet Take 1 tablet (200 mg total) by mouth 3 (three) times daily as needed for pain. One on day before treatment, then as needed   polyethylene glycol (MIRALAX  / GLYCOLAX ) 17 g packet Take 17 g by mouth daily as needed for mild constipation.    prochlorperazine  (COMPAZINE ) 10 MG tablet Take 1 tablet (10 mg total) by mouth every 6 (six) hours as needed for nausea or vomiting.   ranolazine  (RANEXA ) 500 MG 12 hr tablet TAKE ONE (1) TABLET BY MOUTH TWO TIMES PER DAY   sodium bicarbonate  650 MG tablet Take 2 tabs (1300 mg) of sodium bicarbonate  (two 650 mg tabs) the night before and morning of therapy   spironolactone  (ALDACTONE ) 25 MG tablet Take 0.5 tablets (12.5 mg total) by mouth daily.   sulfamethoxazole -trimethoprim  (BACTRIM  DS) 800-160 MG tablet Take 1 tablet by mouth 2 (two) times daily.   tamsulosin  (FLOMAX ) 0.4 MG CAPS capsule TAKE 1 CAPSULE BY MOUTH AT BEDTIME    Immunization History  Administered Date(s) Administered   Fluad Quad(high Dose 65+) 04/17/2019, 04/15/2022, 05/08/2023   INFLUENZA, HIGH DOSE SEASONAL PF 05/15/2013, 06/25/2015, 07/05/2021, 05/08/2023   Influenza Whole 08/15/2000, 07/04/2007, 05/09/2008, 05/31/2010   Influenza,inj,Quad PF,6+ Mos 04/17/2014, 05/18/2016, 06/07/2018   Influenza-Unspecified 05/22/2017, 05/28/2020, 08/05/2022   PFIZER(Purple Top)SARS-COV-2 Vaccination 09/07/2019, 09/28/2019, 05/12/2020, 01/19/2021   PNEUMOCOCCAL CONJUGATE-20 09/09/2023   Pneumococcal Conjugate-13 02/09/2015   Pneumococcal Polysaccharide-23 08/15/2000, 10/29/2008    Respiratory Syncytial Virus Vaccine,Recomb Aduvanted(Arexvy) 10/10/2022   Td 08/15/2005   Tdap 02/10/2018   Zoster Recombinant(Shingrix) 04/05/2017, 07/25/2017   Zoster, Live 02/04/2009        Objective:     BP 128/66   Pulse 67   Temp 97.7 F (36.5 C) (Temporal)   Ht 6' (1.829 m)   Wt 210 lb (95.3 kg)   SpO2 97%   BMI 28.48 kg/m   SpO2: 97 %  GENERAL: This is a well-developed, overweight gentleman, acute on chronically ill-appearing, awake, alert, no acute distress.  Fully ambulatory. HEAD: Normocephalic, atraumatic.  EYES: Pupils equal, round, reactive to light.  No scleral icterus.  MOUTH: Poor dentition, upper  dentures, oral mucosa moist.  No thrush. NECK: Supple. No thyromegaly. Trachea midline. No JVD.  No adenopathy.  Anterior cervical fusion incision, well healed. PULMONARY: Good air entry bilaterally, no adventitious sounds. CARDIOVASCULAR: S1 and S2. Regular rate and rhythm.  Grade 1/6 systolic ejection murmur left sternal border. GASTROINTESTINAL: Protuberant abdomen, soft, no tenderness.   MUSCULOSKELETAL: No joint deformity, no clubbing, no edema.  NEUROLOGIC: No overt focal deficits noted.  Speech is fluent.   SKIN: Intact,warm,dry.  Limited exam shows no rashes.  He has multiple purpura and ecchymoses in the upper extremities. PSYCH: Mood and behavior normal.     Assessment & Plan:     ICD-10-CM   1. Moderate persistent asthma without complication  J45.40 DISCONTINUED: Dupilumab  (DUPIXENT ) 300 MG/2ML SOAJ    2. COPD with asthma (HCC)  J44.89 DISCONTINUED: Dupilumab  (DUPIXENT ) 300 MG/2ML SOAJ    3. OSA (obstructive sleep apnea)  G47.33       Meds ordered this encounter  Medications   Dupilumab  (DUPIXENT ) 300 MG/2ML SOAJ    Sig: Inject 300 mg into the skin every 14 (fourteen) days. **patient completed loading dose in clinic**    Dispense:  12 mL    Refill:  1    Prescription Type::   Renewal   Discussion:    COPD-asthma overlap  syndrome COPD-asthma overlap syndrome is well-controlled. Breathing is satisfactory with no recent congestion. Compliance with Dupixent  is maintained, and the medication is effective. Nasal congestion is managed with nasal sprays, especially at night. - Continue Dupixent  therapy - Use nasal sprays as needed for nasal congestion - Ensure pharmacy transition for Dupixent  is completed  Obstructive sleep apnea Obstructive sleep apnea is well-managed with CPAP therapy. Compliance with CPAP is good, and he has reverted to using his old mask due to comfort issues with the new one. Occasional nasal obstruction affects CPAP efficacy, but nasal sprays are used to alleviate this. - Continue CPAP therapy with the preferred mask - Use nasal sprays as needed for nasal congestion       Advised if symptoms do not improve or worsen, to please contact office for sooner follow up or seek emergency care.    I spent 32 minutes of dedicated to the care of this patient on the date of this encounter to include pre-visit review of records, face-to-face time with the patient discussing conditions above, post visit ordering of testing, clinical documentation with the electronic health record, making appropriate referrals as documented, and communicating necessary findings to members of the patients care team.     C. Leita Sanders, MD Advanced Bronchoscopy PCCM [facility]    *This note was generated using voice recognition software/Dragon and/or AI transcription program.  Despite best efforts to proofread, errors can occur which can change the meaning. Any transcriptional errors that result from this process are unintentional and may not be fully corrected at the time of dictation.

## 2024-04-24 NOTE — Patient Instructions (Signed)
 VISIT SUMMARY:  You had a follow-up appointment to discuss your COPD-asthma overlap syndrome and obstructive sleep apnea. You mentioned feeling tired at times, which sometimes leads to a sensation of shortness of breath. You are compliant with your CPAP therapy and have tried a new mask but reverted to your old one due to discomfort. You also reported occasional nasal congestion at night and use nasal sprays for relief. Additionally, you mentioned dealing with cancer again and issues with your hip and back, as well as an inconsistent voice.  YOUR PLAN:  -COPD-ASTHMA OVERLAP SYNDROME: COPD-asthma overlap syndrome is a condition where symptoms of both chronic obstructive pulmonary disease (COPD) and asthma are present. Your condition is well-controlled, and your breathing is satisfactory with no recent congestion. You should continue using Dupixent  as prescribed and use nasal sprays as needed for nasal congestion. Ensure that the transition of your Dupixent  prescription to the new pharmacy is completed.  -OBSTRUCTIVE SLEEP APNEA: Obstructive sleep apnea is a condition where the airway becomes blocked during sleep, causing breathing to stop and start repeatedly. Your condition is well-managed with CPAP therapy, and you are compliant with using your preferred mask. Continue using CPAP therapy and use nasal sprays as needed for nasal congestion to ensure the effectiveness of the therapy.  INSTRUCTIONS:  Please continue with your current treatments and use nasal sprays as needed for congestion. Ensure that the transition of your Dupixent  prescription to the new pharmacy is completed. Follow up with your healthcare provider if you experience any new or worsening symptoms.

## 2024-04-24 NOTE — Telephone Encounter (Unsigned)
 Copied from CRM [id number]. Topic: Clinical - Prescription Issue >> Apr 24, 2024  2:05 PM [name] PARAS wrote: Reason for CRM:   Pt is contacting clinic regarding his refill for Dupixent . The refills were sent to [facility]; however, they are unable to fill it. He is requesting the refill be resent to the [facility] pharmacy on file.   CB#  [phone]

## 2024-04-24 NOTE — Telephone Encounter (Signed)
 Prescription was sent to [facility] this morning by Cleora Puls RPH.  Nothing further needed.

## 2024-04-25 ENCOUNTER — Other Ambulatory Visit

## 2024-04-25 ENCOUNTER — Ambulatory Visit

## 2024-04-25 ENCOUNTER — Other Ambulatory Visit: Payer: Self-pay

## 2024-04-25 ENCOUNTER — Telehealth: Payer: Self-pay | Admitting: Pharmacist

## 2024-04-25 DIAGNOSIS — C679 Malignant neoplasm of bladder, unspecified: Secondary | ICD-10-CM

## 2024-04-25 MED ORDER — CIPROFLOXACIN HCL 500 MG PO TABS: 500.0000 mg | ORAL_TABLET | Freq: Two times a day (BID) | ORAL | 0 refills | Status: AC

## 2024-04-25 NOTE — Progress Notes (Signed)
 Returned call to pt regarding UTI symptoms. Pt states he is having pain and burning upon urination. Pt took 3 days worth of ABX and finished 2 days ago. Dr Tina notified. Will send in Cipro  RX to pt's pharmacy. Pt informed. Pt acknowledged information.

## 2024-04-25 NOTE — Telephone Encounter (Signed)
 Received notification from [employer] regarding a prior authorization for DUPIXENT . Authorization has been APPROVED from 04/25/24 to 04/25/2025. Approval letter sent to scan center.  Authorization # [id number]  Sherry Pennant, PharmD, MPH, BCPS, CPP Clinical Pharmacist [facility])

## 2024-04-25 NOTE — Telephone Encounter (Signed)
 Submitted a Prior Authorization RENEWAL request to [facility] for DUPIXENT  via CoverMyMeds. Will update once we receive a response.  Key: [id number]

## 2024-04-26 DIAGNOSIS — M5459 Other low back pain: Secondary | ICD-10-CM | POA: Diagnosis not present

## 2024-04-30 ENCOUNTER — Encounter: Payer: Self-pay | Admitting: Pulmonary Disease

## 2024-05-01 ENCOUNTER — Encounter: Payer: Self-pay | Admitting: Physical Medicine and Rehabilitation

## 2024-05-01 ENCOUNTER — Other Ambulatory Visit: Payer: Self-pay | Admitting: Physical Medicine and Rehabilitation

## 2024-05-01 DIAGNOSIS — N2889 Other specified disorders of kidney and ureter: Secondary | ICD-10-CM

## 2024-05-03 ENCOUNTER — Encounter: Payer: Self-pay | Admitting: Physical Medicine and Rehabilitation

## 2024-05-05 DIAGNOSIS — M7072 Other bursitis of hip, left hip: Secondary | ICD-10-CM | POA: Diagnosis not present

## 2024-05-06 ENCOUNTER — Ambulatory Visit
Admission: RE | Admit: 2024-05-06 | Discharge: 2024-05-06 | Disposition: A | Discharge status: Home / Self Care | Source: Ambulatory Visit | Attending: Physical Medicine and Rehabilitation | Admitting: Physical Medicine and Rehabilitation

## 2024-05-06 DIAGNOSIS — N2889 Other specified disorders of kidney and ureter: Secondary | ICD-10-CM

## 2024-05-06 DIAGNOSIS — Z961 Presence of intraocular lens: Secondary | ICD-10-CM | POA: Diagnosis not present

## 2024-05-06 DIAGNOSIS — H43813 Vitreous degeneration, bilateral: Secondary | ICD-10-CM | POA: Diagnosis not present

## 2024-05-06 DIAGNOSIS — H34232 Retinal artery branch occlusion, left eye: Secondary | ICD-10-CM | POA: Diagnosis not present

## 2024-05-07 DIAGNOSIS — I10 Essential (primary) hypertension: Secondary | ICD-10-CM | POA: Diagnosis not present

## 2024-05-07 DIAGNOSIS — G4733 Obstructive sleep apnea (adult) (pediatric): Secondary | ICD-10-CM | POA: Diagnosis not present

## 2024-05-08 ENCOUNTER — Ambulatory Visit: Attending: Cardiovascular Disease | Admitting: Cardiovascular Disease

## 2024-05-08 ENCOUNTER — Encounter: Payer: Self-pay | Admitting: Cardiovascular Disease

## 2024-05-08 VITALS — BP 120/60 | HR 86 | Ht 72.0 in | Wt 213.4 lb

## 2024-05-08 DIAGNOSIS — I2510 Atherosclerotic heart disease of native coronary artery without angina pectoris: Secondary | ICD-10-CM | POA: Diagnosis not present

## 2024-05-08 DIAGNOSIS — I739 Peripheral vascular disease, unspecified: Secondary | ICD-10-CM | POA: Diagnosis not present

## 2024-05-08 DIAGNOSIS — E785 Hyperlipidemia, unspecified: Secondary | ICD-10-CM | POA: Diagnosis not present

## 2024-05-08 DIAGNOSIS — I10 Essential (primary) hypertension: Secondary | ICD-10-CM | POA: Diagnosis not present

## 2024-05-08 NOTE — Progress Notes (Signed)
 Cardiology Office Note   Date:  05/08/2024   ID:  [name], DOB Mar 13, 1941, MRN [id number]  PCP:  [name] Baller, MD  Cardiologist:  Dr. Perla   Chief Complaint  Patient presents with   Follow-up    PAD no complaints today. Meds reviewed verbally with pt.       History of Present Illness: [name] is a 83 y.o. male who is here today for a follow-up visit regarding peripheral arterial disease. The patient has known history of coronary artery disease status post CABG in 1990, PCI of the left circumflex in 2002, DVT in the left lower extremity, hyperlipidemia and back surgery for spinal stenosis in 2017.  He had hip replacement x 2. 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  He was seen in early 2019 for peripheral arterial disease. Lower extremity arterial Doppler showed noncompressible arteries on the right side with normal toe brachial index and biphasic waveform.  Left ABI was normal with biphasic waveforms. Lower extremity arterial duplex showed evidence of bilateral SFA disease. His symptoms were felt to be multifactorial due to PAD, arthritis and severe peripheral neuropathy.  He had worsening bilateral leg pain in 2020.  Angiography in January of 2020 showed no significant aortoiliac disease.  On the right side, there was moderate to severely calcified SFA with diffuse disease especially in the midsegment and three-vessel runoff below the knee.  On the left, there was borderline significant SFA disease which was also calcified with three-vessel runoff below the knee.  I felt that his symptoms were out of proportion to his angiogram findings. The patient was hospitalized in October 2020 with COVID-19 pneumonia and had multiple complications.    He had back surgery done in November 2021 with significant improvement in his back pain and leg pain.   He was diagnosed with bladder cancer in 2023  and was treated with surgery.  He completed a 6-week course of BCG.  He had worsening chest pain in 2024.  Cardiac catheterization was done which showed left dominant coronary arteries with severe three-vessel coronary artery disease.  His grafts were patent but there was severe stenosis in the mid to distal left circumflex.  Ejection fraction was moderately reduced.  I performed successful angioplasty and drug-eluting stent placement to the left circumflex.    He had cardiac catheterization done in June of this year which showed patent grafts and patent stents in the proximal and mid left circumflex but he was noted to have some progression of left main and distal left circumflex disease.  Most recent lower extremity arterial duplex in August showed bilateral SFA occlusion.  He has been doing reasonably well with no recent chest pain or worsening dyspnea.  He has chronic bilateral leg pain that he feels related to his neuropathy.  No calf claudication.  Past Medical History:  Diagnosis Date   Actinic keratosis    Allergy    Anginal pain    Anxiety    Aortic ectasia, abdominal    a.) CT abd 01/01/2022: 2.9 cm infrarenal abdominal aorta   Aortic stenosis    Asthma    Bladder cancer (HCC) 2023   BPH (benign prostatic hyperplasia) 09/06/2007   CAD s/p CABG    a.) 1990 s/p MI--> CABG x 3; b.) LHC/PCI 2002 --> BMS (unk type) to LCx   Carotid arterial disease    a.) 08/2016 Carotid U/S: <39% bilat; b.)  carotid doppler 10/12/2022: mild-to-moderate (<50%) bilateral bifurcation plaque.   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 06/2019 Echo (in setting of COVID): EF 35-40%, mild LVH, g1 DD, glob HK; b. 09/2019 Echo: EF 50-55%, Gr1 DD; c. 03/2021 Echo: EF 50-55%, no rwma, mild LVH, GrI DD, nl RV fxn. RVSP 44.41mmHg. Mild-mod dil LA. Triv MR. Mild-mod Ao sclerosis.   History of 2019 novel coronavirus disease (COVID-19) 05/20/2019   History of hiatal hernia    History of shingles    HLD (hyperlipidemia) 04/26/2007   HTN (hypertension) 04/30/2007   LBBB (left bundle branch block)    Long-term use of aspirin  therapy    Lumbar disc disease with radiculopathy    Migraines    Myocardial infarction (HCC) 1990   a.) resulted in 3v CABG   Myocardial infarction (HCC) 2002   a.) second cardiac event; PCI with placement of BMS (unknown type) to LCx   Neuropathy of both feet    OSA on CPAP    Osteoarthritis    Oxygen  deficiency    PAD (peripheral artery disease)    a. 07/2017 LE duplex: RSFA 75-84m, LSFA 75-70m, 50-74d; c. 08/2018 Periph Angio: No signif AoIliac dzs. Mod-sev Ca2+ RSFA w/ diff dzs throughout- 3 vessel runoff. Borderline signif LSFA dzs w/ mod-sev Ca2+ vessels and 3 vessel runoff below the knee-->Med rx.   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   Right mid dorsum forearm - in situ - EDC   Stroke (HCC)    T2DM (type 2 diabetes mellitus) (HCC)    Traumatic open wound of lower leg with delayed  healing, left 12/28/2022    Past Surgical History:  Procedure Laterality Date   ABDOMINAL AORTOGRAM W/LOWER EXTREMITY [name] 09/12/2018   Procedure: ABDOMINAL AORTOGRAM W/LOWER EXTREMITY;  Surgeon: Darron Deatrice LABOR, MD;  Location: [facility];  Service: Cardiovascular;  Laterality: [name];   ANTERIOR CERVICAL DECOMP/DISCECTOMY FUSION [name] 07/11/2022   Anterior Cervical Decompression Fusion ,Interboy Prothesis,Plate/Screws , Cervical four-five,Cervical five-six Gavin, Reyes, MD)   APPENDECTOMY     rupture   BACK SURGERY     1984 and then another one [facility]   BLADDER INSTILLATION [name] 10/19/2021   Procedure: BLADDER INSTILLATION OF GEMCITABINE ;  Surgeon: Twylla Glendia BROCKS, MD;  Location: [facility];  Service: Urology;  Laterality: [name];   BLADDER INSTILLATION [name] 11/15/2022   Procedure: BLADDER INSTILLATION  OF GEMCITABINE ;  Surgeon: Twylla Glendia BROCKS, MD;  Location: [facility];  Service: Urology;  Laterality: [name];   CARDIAC CATHETERIZATION [name] 07/07/2015   Procedure: Left Heart Cath and Cors/Grafts Angiography;  Surgeon: Evalene JINNY Lunger, MD;  Location: [facility];  Service: Cardiovascular;  Laterality: [name];   CATARACT EXTRACTION, BILATERAL  2009   CERVICAL SPINE SURGERY  12/2016   cervical stenosis Gavin)   COLONOSCOPY  03/2010   HP polyp, diverticulosis, rpt 10 yrs ([name])   CORONARY ANGIOPLASTY  11/30/2000   LCX   CORONARY ARTERY BYPASS GRAFT  1990   3 vessel    CORONARY STENT INTERVENTION [name] 01/13/2023   Procedure: CORONARY STENT INTERVENTION;  Surgeon: Darron Deatrice LABOR, MD;  Location: [facility];  Service: Cardiovascular;  Laterality: [name];   CYSTOSCOPY  12/23/2010   [facility]   CYSTOSCOPY WITH BIOPSY [name] 01/16/2024   Procedure: CYSTOSCOPY, WITH BIOPSY;  Surgeon: Twylla Glendia BROCKS, MD;  Location: [facility];  Service: Urology;  Laterality: [name];   EYE SURGERY     JOINT REPLACEMENT     KNEE ARTHROSCOPY Right    x2   LAMINOTOMY  1986   L5/S1 lumbar laminotomy for  two ruptured discs/fusion   LEFT HEART CATH AND CORS/GRAFTS ANGIOGRAPHY [name] 01/19/2024   Procedure: LEFT HEART CATH AND CORS/GRAFTS ANGIOGRAPHY;  Surgeon: Mady Bruckner, MD;  Location: [facility];  Service: Cardiovascular;  Laterality: [name];   LUMBAR LAMINECTOMY/DECOMPRESSION MICRODISCECTOMY [name] 07/13/2016   Procedure: LUMBAR TWO-THREE, LUMBAR THREE-FOUR, LUMBAR FOUR-FIVE LAMINECTOMY AND FORAMINOTOMY;  Surgeon: Reyes Budge, MD;  Location: [facility];  Service: Neurosurgery;  Laterality: [name];  LAMINECTOMY AND FORAMINOTOMY L2-L3, L3-L4,L4-L5   LUMBAR SPINE SURGERY  06/2020   Bilateral redo laminectomy/laminotomy/foraminotomies/medial facetectomy to decompress the bilateral L4 and L5 nerve roots Gavin)   REVERSE SHOULDER ARTHROPLASTY Right 12/21/2023   Procedure: ARTHROPLASTY, SHOULDER, TOTAL, REVERSE;  Surgeon: Melita Drivers, MD;  Location: [facility];  Service: Orthopedics;  Laterality: Right;   RIGHT/LEFT HEART CATH AND CORONARY ANGIOGRAPHY Bilateral 01/13/2023   Procedure: RIGHT/LEFT HEART CATH AND CORONARY ANGIOGRAPHY;  Surgeon: Darron Deatrice LABOR, MD;  Location: [facility];  Service: Cardiovascular;  Laterality: Bilateral;   SPINE SURGERY     TOTAL HIP ARTHROPLASTY Bilateral 1999,2000   TOTAL KNEE ARTHROPLASTY Right 03/06/2017   Procedure: RIGHT TOTAL KNEE ARTHROPLASTY;  Surgeon: Melodi Lerner, MD;  Location: [facility];  Service: Orthopedics;  Laterality: Right;   TRANSURETHRAL RESECTION OF BLADDER TUMOR [name] 10/19/2021   Procedure: TRANSURETHRAL RESECTION OF BLADDER TUMOR (TURBT);  Surgeon: Twylla Glendia BROCKS, MD;  Location: [facility];  Service: Urology;  Laterality: [name];   TRANSURETHRAL RESECTION OF BLADDER TUMOR [name] 11/15/2022   Procedure: TRANSURETHRAL RESECTION OF BLADDER TUMOR (TURBT);  Surgeon: Twylla Glendia BROCKS, MD;  Location: [facility];  Service: Urology;  Laterality: [name];     Current Outpatient Medications  Medication Sig Dispense Refill   albuterol  (PROVENTIL ) (2.5  MG/3ML) 0.083% nebulizer solution TAKE 3 MLS BY NEBULIZATION EVERY 6 HOURSAS NEEDED FOR WHEEZING OR SHORTNESS OF BREATH 75 mL 12   albuterol  (VENTOLIN  HFA) 108 (90 Base) MCG/ACT inhaler TAKE 2 PUFFS INTO LUNGS EVERY 6 HOURS ASNEEDED FOR WHEEZING OR SHORTNESS OF BREATH 8.5 each 3   aspirin  EC 81 MG EC tablet Take 1 tablet (81 mg total) by mouth daily. 30 tablet 0   atorvastatin  (LIPITOR) 20 MG tablet TAKE 1 TABLET BY MOUTH DAILY 90 tablet 3   Cyanocobalamin  (VITAMIN B-12 ER PO) Take 1 capsule by  mouth daily.     docusate sodium  (COLACE) 100 MG capsule Take 1 capsule (100 mg total) by mouth daily as needed for mild constipation.     ezetimibe  (ZETIA ) 10 MG tablet TAKE 1 TABLET BY MOUTH ONCE DAILY 90 tablet 3   famotidine  (PEPCID ) 20 MG tablet Take 20 mg by mouth daily.     fluorouracil  (EFUDEX ) 5 % cream Apply to nose twice a day x 7 days 30 g 2   fluticasone  (FLONASE ) 50 MCG/ACT nasal spray Place 1 spray into both nostrils daily as needed for allergies or rhinitis.     Fluticasone -Umeclidin-Vilant (TRELEGY ELLIPTA ) 200-62.5-25 MCG/ACT AEPB Inhale 1 puff into the lungs daily. 180 each 3   furosemide  (LASIX ) 40 MG tablet Take 0.5 tablets (20 mg total) by mouth daily as needed. Take 1 tablet (40 mg) daily as need for weight greater then 208. 180 tablet 3   gabapentin  (NEURONTIN ) 600 MG tablet Take 0.5 tablets (300 mg total) by mouth 2 (two) times daily AND 1 tablet (600 mg total) at bedtime. 180 tablet 3   HYDROcodone -acetaminophen  (NORCO/VICODIN) 5-325 MG tablet Take 1-2 tablets by mouth every 6 (six) hours as needed.     hydrocortisone  2.5 % cream Apply to chin and beside the nose at bedtime Tues, Thurs, Sat 30 g 5   isosorbide  mononitrate (IMDUR ) 30 MG 24 hr tablet TAKE 1 TABLET BY MOUTH DAILY. 90 tablet 3   ketoconazole  (NIZORAL ) 2 % cream Apply to chin and beside the nose at bedtime Mon, Wed, Fri 15 g 0   losartan  (COZAAR ) 25 MG tablet Take 1 tablet (25 mg total) by mouth daily. 90 tablet 3    metoprolol  succinate (TOPROL  XL) 25 MG 24 hr tablet Take 0.5 tablets (12.5 mg total) by mouth daily. 45 tablet 3   montelukast  (SINGULAIR ) 10 MG tablet TAKE 1 TABLET BY MOUTH DAILY 30 tablet 11   mupirocin  ointment (BACTROBAN ) 2 % Apply 1 Application topically 2 (two) times daily. 22 g 0   nitroGLYCERIN  (NITROSTAT ) 0.4 MG SL tablet Place 1 tablet (0.4 mg total) under the tongue every 5 (five) minutes as needed for chest pain. 25 tablet 3   ondansetron  (ZOFRAN ) 8 MG tablet Take 1 tablet (8 mg total) by mouth every 8 (eight) hours as needed for nausea or vomiting. 30 tablet 1   phenazopyridine  (PYRIDIUM ) 200 MG tablet Take 1 tablet (200 mg total) by mouth 3 (three) times daily as needed for pain. One on day before treatment, then as needed 30 tablet 1   polyethylene glycol (MIRALAX  / GLYCOLAX ) 17 g packet Take 17 g by mouth daily as needed for mild constipation.      prochlorperazine  (COMPAZINE ) 10 MG tablet Take 1 tablet (10 mg total) by mouth every 6 (six) hours as needed for nausea or vomiting. 30 tablet 1   ranolazine  (RANEXA ) 500 MG 12 hr tablet TAKE ONE (1) TABLET BY MOUTH TWO TIMES PER DAY 180 tablet 3   sodium bicarbonate  650 MG tablet Take 2 tabs (1300 mg) of sodium bicarbonate  (two 650 mg tabs) the night before and morning of therapy 28 tablet 0   spironolactone  (ALDACTONE ) 25 MG tablet Take 0.5 tablets (12.5 mg total) by mouth daily. 45 tablet 3   tamsulosin  (FLOMAX ) 0.4 MG CAPS capsule TAKE 1 CAPSULE BY MOUTH AT BEDTIME 90 capsule 1   sulfamethoxazole -trimethoprim  (BACTRIM  DS) 800-160 MG tablet Take 1 tablet by mouth 2 (two) times daily. (Patient not taking: Reported on 05/08/2024) 6 tablet 0  No current facility-administered medications for this visit.    Allergies:   Vioxx [rofecoxib], Oxycodone , Spiriva  respimat [tiotropium bromide monohydrate ], Contrast media [iodinated contrast media], and Morphine     Social History:  The patient  reports that he quit smoking about 35 years ago.  His smoking use included cigarettes. He started smoking about 60 years ago. He has a 25 pack-year smoking history. He has been exposed to tobacco smoke. He has never used smokeless tobacco. He reports that he does not drink alcohol and does not use drugs.   Family History:  The patient's family history includes Cancer in his sister and another family member; Diabetes in his brother and mother; Heart attack in his mother; Heart disease in his father; Kidney disease in his father and sister; Polycystic kidney disease in his father and sister; Stroke in his father and mother.    ROS:  Please see the history of present illness.   Otherwise, review of systems are positive for none.   All other systems are reviewed and negative.    PHYSICAL EXAM: VS:  BP 120/60 (BP Location: Left Arm, Patient Position: Sitting, Cuff Size: Large)   Pulse 86   Ht 6' (1.829 m)   Wt 213 lb 6 oz (96.8 kg)   SpO2 98%   BMI 28.94 kg/m  , BMI Body mass index is 28.94 kg/m. GEN: Well nourished, well developed, in no acute distress  HEENT: normal  Neck: no JVD, carotid bruits, or masses Cardiac: RRR; no  rubs, or gallops,no edema .  2/ 6 systolic ejection murmur in the aortic area Respiratory:  clear to auscultation bilaterally, normal work of breathing GI: soft, nontender, nondistended, + BS MS: no deformity or atrophy  Skin: warm and dry, no rash Neuro:  Strength and sensation are intact Psych: euthymic mood, full affect Vascular: Femoral pulses are normal.  Distal pulses are nonpalpable   EKG:  EKG is not ordered today.  Recent Labs: 12/28/2023: Pro B Natriuretic peptide (BNP) 329.0 01/17/2024: B Natriuretic Peptide 535.6 04/12/2024: ALT 11; BUN 43; Creatinine 1.81; Hemoglobin 11.6; Platelet Count 156; Potassium 4.1; Sodium 139    Lipid Panel    Component Value Date/Time   CHOL 108 03/22/2024 0811   CHOL 161 08/11/2023 1055   TRIG 85.0 03/22/2024 0811   HDL 33.30 (L) 03/22/2024 0811   HDL 41 08/11/2023  1055   CHOLHDL 3 03/22/2024 0811   VLDL 17.0 03/22/2024 0811   LDLCALC 58 03/22/2024 0811   LDLCALC 96 08/11/2023 1055   LDLDIRECT 106.0 01/01/2016 0844      Wt Readings from Last 3 Encounters:  05/08/24 213 lb 6 oz (96.8 kg)  04/24/24 210 lb (95.3 kg)  04/19/24 212 lb (96.2 kg)         05/08/2024   12:52 PM  PAD Screen  Previous PAD dx? Yes  Previous surgical procedure? No  Pain with walking? Yes  Feet/toe relief with dangling? Yes  Painful, non-healing ulcers? No      ASSESSMENT AND PLAN:  1.  Peripheral arterial disease: Both SFAs are now occluded but he has minimal calf claudication and no critical limb ischemia.  No indication for revascularization.  Continue medical therapy.    2.  Coronary artery disease involving native coronary arteries without angina: I reviewed his recent cardiac catheterization in June.  No revascularization is needed.  His left main disease appears to be moderate.  3.  Hyperlipidemia: Continue treatment with atorvastatin  and ezetimibe .  Most recent lipid profile showed  an LDL of 58.  4.  Essential hypertension: Blood pressure is controlled.    Disposition:   FU with me in 12 months for PAD.  Continue to follow-up with Dr. Gollan as planned.  Signed, Deatrice Cage, MD 05/08/24 [facility] [place], [place] [phone]

## 2024-05-08 NOTE — Patient Instructions (Signed)
 Medication Instructions:  No changes *If you need a refill on your cardiac medications before your next appointment, please call your pharmacy*  Lab Work: None ordered If you have labs (blood work) drawn today and your tests are completely normal, you will receive your results only by: MyChart Message (if you have MyChart) OR A paper copy in the mail If you have any lab test that is abnormal or we need to change your treatment, we will call you to review the results.  Testing/Procedures: None ordered  Follow-Up: At [facility], you and your health needs are our priority.  As part of our continuing mission to provide you with exceptional heart care, our providers are all part of one team.  This team includes your primary Cardiologist (physician) and Advanced Practice Providers or APPs (Physician Assistants and Nurse Practitioners) who all work together to provide you with the care you need, when you need it.  Your next appointment:   12 month(s)  Provider:   Dr. Alvenia Aus  We recommend signing up for the patient portal called "MyChart".  Sign up information is provided on this After Visit Summary.  MyChart is used to connect with patients for Virtual Visits (Telemedicine).  Patients are able to view lab/test results, encounter notes, upcoming appointments, etc.  Non-urgent messages can be sent to your provider as well.   To learn more about what you can do with MyChart, go to [email or web address].

## 2024-05-17 ENCOUNTER — Ambulatory Visit: Admitting: Urology

## 2024-05-17 ENCOUNTER — Inpatient Hospital Stay

## 2024-05-17 ENCOUNTER — Encounter: Payer: Self-pay | Admitting: Urology

## 2024-05-17 VITALS — BP 136/56 | HR 89 | Temp 97.0°F | Resp 20 | Ht 76.0 in | Wt 214.7 lb

## 2024-05-17 VITALS — BP 138/70 | HR 79 | Ht 72.0 in | Wt 215.1 lb

## 2024-05-17 DIAGNOSIS — N281 Cyst of kidney, acquired: Secondary | ICD-10-CM | POA: Insufficient documentation

## 2024-05-17 DIAGNOSIS — N1831 Chronic kidney disease, stage 3a: Secondary | ICD-10-CM | POA: Diagnosis not present

## 2024-05-17 DIAGNOSIS — Z5111 Encounter for antineoplastic chemotherapy: Secondary | ICD-10-CM | POA: Diagnosis not present

## 2024-05-17 DIAGNOSIS — N2889 Other specified disorders of kidney and ureter: Secondary | ICD-10-CM

## 2024-05-17 DIAGNOSIS — C679 Malignant neoplasm of bladder, unspecified: Secondary | ICD-10-CM | POA: Insufficient documentation

## 2024-05-17 DIAGNOSIS — R3915 Urgency of urination: Secondary | ICD-10-CM | POA: Diagnosis not present

## 2024-05-17 LAB — CMP (CANCER CENTER ONLY)
ALT: 9 U/L (ref 0–44)
AST: 12 U/L — ABNORMAL LOW (ref 15–41)
Albumin: 4.2 g/dL (ref 3.5–5.0)
Alkaline Phosphatase: 38 U/L (ref 38–126)
Anion gap: 5 (ref 5–15)
BUN: 30 mg/dL — ABNORMAL HIGH (ref 8–23)
CO2: 27 mmol/L (ref 22–32)
Calcium: 9.3 mg/dL (ref 8.9–10.3)
Chloride: 107 mmol/L (ref 98–111)
Creatinine: 1.72 mg/dL — ABNORMAL HIGH (ref 0.61–1.24)
GFR, Estimated: 39 mL/min — ABNORMAL LOW (ref >60)
Glucose, Bld: 107 mg/dL — ABNORMAL HIGH (ref 70–99)
Potassium: 4.6 mmol/L (ref 3.5–5.1)
Sodium: 139 mmol/L (ref 135–145)
Total Bilirubin: 0.6 mg/dL (ref 0.0–1.2)
Total Protein: 6.7 g/dL (ref 6.5–8.1)

## 2024-05-17 LAB — CBC WITH DIFFERENTIAL (CANCER CENTER ONLY)
Abs Immature Granulocytes: 0.02 K/uL (ref 0.00–0.07)
Basophils Absolute: 0.1 K/uL (ref 0.0–0.1)
Basophils Relative: 1 %
Eosinophils Absolute: 0.3 K/uL (ref 0.0–0.5)
Eosinophils Relative: 3 %
HCT: 36.5 % — ABNORMAL LOW (ref 39.0–52.0)
Hemoglobin: 12.3 g/dL — ABNORMAL LOW (ref 13.0–17.0)
Immature Granulocytes: 0 %
Lymphocytes Relative: 17 %
Lymphs Abs: 1.3 K/uL (ref 0.7–4.0)
MCH: 32.8 pg (ref 26.0–34.0)
MCHC: 33.7 g/dL (ref 30.0–36.0)
MCV: 97.3 fL (ref 80.0–100.0)
Monocytes Absolute: 0.9 K/uL (ref 0.1–1.0)
Monocytes Relative: 11 %
Neutro Abs: 5.3 K/uL (ref 1.7–7.7)
Neutrophils Relative %: 68 %
Platelet Count: 187 K/uL (ref 150–400)
RBC: 3.75 MIL/uL — ABNORMAL LOW (ref 4.22–5.81)
RDW: 13.2 % (ref 11.5–15.5)
WBC Count: 7.7 K/uL (ref 4.0–10.5)
nRBC: 0 % (ref 0.0–0.2)

## 2024-05-17 LAB — URINALYSIS, COMPLETE (UACMP) WITH MICROSCOPIC
Bacteria, UA: NONE SEEN
Bilirubin Urine: NEGATIVE
Glucose, UA: NEGATIVE mg/dL
Hgb urine dipstick: NEGATIVE
Ketones, ur: NEGATIVE mg/dL
Leukocytes,Ua: NEGATIVE
Nitrite: NEGATIVE
Protein, ur: NEGATIVE mg/dL
Specific Gravity, Urine: 1.008 (ref 1.005–1.030)
pH: 6 (ref 5.0–8.0)

## 2024-05-17 MED ORDER — GEMTESA 75 MG PO TABS
75.0000 mg | ORAL_TABLET | Freq: Every day | ORAL | Status: DC
Start: 2024-05-17 — End: 2024-06-11

## 2024-05-17 MED ORDER — SODIUM CHLORIDE (PF) 0.9 % IJ SOLN
1000.0000 mg | Freq: Once | INTRAVENOUS | Status: AC
  Administered 2024-05-17: 1000 mg via INTRAVESICAL
  Filled 2024-05-17: qty 26.3

## 2024-05-17 MED ORDER — SODIUM CHLORIDE (PF) 0.9 % IJ SOLN
37.5000 mg | Freq: Once | INTRAVENOUS | Status: AC
  Administered 2024-05-17: 37.5 mg via INTRAVESICAL
  Filled 2024-05-17: qty 3.75

## 2024-05-17 MED ORDER — OXYBUTYNIN CHLORIDE 5 MG PO TABS
5.0000 mg | ORAL_TABLET | Freq: Once | ORAL | Status: AC
  Administered 2024-05-17: 5 mg via ORAL
  Filled 2024-05-17: qty 1

## 2024-05-17 MED ORDER — PROCHLORPERAZINE MALEATE 10 MG PO TABS
10.0000 mg | ORAL_TABLET | Freq: Once | ORAL | Status: AC
  Administered 2024-05-17: 10 mg via ORAL
  Filled 2024-05-17: qty 1

## 2024-05-17 MED ORDER — LIDOCAINE HCL URETHRAL/MUCOSAL 2 % EX GEL
1.0000 | Freq: Once | CUTANEOUS | Status: AC
  Administered 2024-05-17: 1 via URETHRAL
  Filled 2024-05-17: qty 1

## 2024-05-17 MED ORDER — SODIUM CHLORIDE 0.9 % IV SOLN: INTRAVENOUS | Status: DC

## 2024-05-17 NOTE — Progress Notes (Signed)
 [name] OFFICE PROGRESS NOTE  Patient Care Team: Rilla Baller, MD as PCP - General (Family Medicine) Perla Evalene PARAS, MD as PCP - Cardiology (Cardiology) Perla Evalene PARAS, MD as Consulting Physician (Cardiology) Tamea Dedra CROME, MD as Consulting Physician (Pulmonary Disease) Darron Deatrice LABOR, MD as Consulting Physician (Cardiology) [facility], [facility] (Ophthalmology) Tina Pauletta BROCKS, MD as Consulting Physician (Oncology)  [name] is a 83 y.o.male with history of DVT, CKD 3, CAD with MI, CABG, emphysema, hypertension, hyperlipidemia, type 2 diabetes, carotid artery disease, BPH, DDD 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   Patient started intravesical gemcitabine  with docetaxel  without issue. Now week 6. Tolerating well.   Will start maintenance today on 10/3. Assessment & Plan Urothelial carcinoma of bladder (HCC) Will start maintenance gem/doce Sodium bicarb the night before and the morning of treatment Pyridium  200 mg as needed 3 times a day on day 1 and 2 if having urinary pain Fridays works well. Next treatment on 11/7. Stage 3a chronic kidney disease (HCC) Fluid restrictions only in the morning before treatment.  Advised to increase fluid after treatment, and goal of 64 ounces everyday  Patient needs a bed for infusion for intravesical therapy.    Pauletta BROCKS Tina, MD  INTERVAL HISTORY: Patient returns for follow-up.  Overall feeling well, no nausea or vomiting.  Reported frequent urination.  No blood in the urine.  UTI symptoms resolved.  Saw Dr. Roseann today and found to have renal cyst.  Oncology History  Urothelial carcinoma of bladder (HCC)  10/19/2021 Pathology Results   DIAGNOSIS:  A. URINARY BLADDER TUMOR; TRANSURETHRAL RESECTION:  - NONINVASIVE PAPILLARY UROTHELIAL CARCINOMA, HIGH-GRADE (WHO/ISUP).  - MUSCULARIS PROPRIA PRESENT AND UNINVOLVED.   B. URINARY BLADDER, TUMOR  BASE; TRANSURETHRAL RESECTION:  - BENIGN MUSCULARIS PROPRIA.  - NO DEFINITE INTACT UROTHELIUM IDENTIFIED.  - NEGATIVE FOR MALIGNANCY.   C. URINARY BLADDER, LEFT POSTERIOR WALL; TRANSURETHRAL RESECTION:  - NONINVASIVE PAPILLARY UROTHELIAL CARCINOMA, LOW-GRADE (WHO/ISUP).  - NO MUSCULARIS PROPRIA IDENTIFIED.    11/12/2021 Initial Diagnosis   Urothelial carcinoma of bladder (HCC)   11/15/2022 Pathology Results   IAGNOSIS:  A. URINARY BLADDER, RIGHT LATERAL WALL; TRANSURETHRAL RESECTION:  - NONINVASIVE PAPILLARY UROTHELIAL CARCINOMA, HIGH-GRADE (WHO/ISUP).  - NO MUSCULARIS PROPRIA IDENTIFIED.   B. URINARY BLADDER TUMOR BASE, RIGHT LATERAL WALL; TRANSURETHRAL BIOPSY:  - PARTIALLY DENUDED, BENIGN APPEARING UROTHELIAL TISSUE.  - MUSCULARIS PROPRIA PRESENT AND UNINVOLVED.   C. URINARY BLADDER, LEFT LATERAL WALL; TRANSURETHRAL RESECTION:  - NONINVASIVE PAPILLARY UROTHELIAL CARCINOMA, LOW-GRADE (WHO/ISUP).  - NO MUSCULARIS PROPRIA IDENTIFIED.    01/16/2024 Pathology Results   1. 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,    03/15/2024 -  Chemotherapy   Patient is on Treatment Plan : BLADDER Gemcitabine  (1000), Docetaxel  (37.5) INTRAVESICAL q7d     03/15/2024 Cancer Staging   Staging form: Urinary Bladder, AJCC 8th Edition - Clinical: Stage 0is (cTis, cN0, cM0) - Signed by Tina Pauletta BROCKS, MD on 03/15/2024 WHO/ISUP grade (low/high): High Grade Histologic grading system: 2 grade system      PHYSICAL EXAMINATION: ECOG PERFORMANCE STATUS: 2 - Symptomatic, <50% confined to bed  Vitals:   05/17/24 1238  BP: (!) 136/56  Pulse: 89  Resp: 20  Temp: (!) 97 F (36.1 C)  SpO2: 95%   Filed Weights   05/17/24 1238  Weight: 214 lb 11.2 oz (97.4 kg)  GENERAL: alert, no distress and comfortable SKIN: skin color normal  Relevant data reviewed during this visit included labs.

## 2024-05-17 NOTE — Assessment & Plan Note (Addendum)
 Fluid restrictions only in the morning before treatment.  Advised to increase fluid after treatment, and goal of 64 ounces everyday

## 2024-05-17 NOTE — Progress Notes (Signed)
 05/17/2024 8:17 AM   [name] [birth year] [id number]  Referring provider: Rilla Baller, MD [address] [place],  [place] [address]  Chief Complaint  Patient presents with   Other   Urologic history: 1.  Ta high-grade urothelial carcinoma bladder Incidentally noted on pelvic MRI ordered by orthopedics to have a 15 x 20 mm bladder mass near right UO; cystoscopy with 3 cm papillary tumor TURBT 10/19/2025 with high-grade tumor and a low-grade tumor left posterolateral wall Induction BCG x6 completed 01/20/2022 Maintenance BCG x 3: 05/23/2022 MR urogram 03/2022 showed no upper tract abnormalities TURBT 11/2022 for a recurrent papillary tumor on surveillance; path high-grade Ta urothelial carcinoma Reinduction BCG x 6 completed 02/15/2023 Maintenance BCG x 3 completed: 07/06/2023   2.  BPH with LUTS Cystoscopy moderate lateral lobe enlargement/bladder neck elevation On tamsulosin   HPI: [name] is a 83 y.o. male scheduled follow-up appointment for evaluation of a left renal mass.  Seeing orthopedics for back pain and had an MRI.  Abnormal signal intensity upper aspect of the left kidney was suggested.  Renal ultrasound was performed 05/06/2024 which showed a extremely small 5 x 4 x 3 mm upper pole hypoechogenicity. BCG refractory urothelial carcinoma the bladder.  He completed a course of intravesical gemcitabine /docetaxel  and is currently undergoing monthly maintenance gemcitabine /docetaxel .  Scheduled for surveillance cystoscopy December 2025. Does complain of urinary frequency and urgency   PMH: Past Medical History:  Diagnosis Date   Actinic keratosis    Allergy    Anginal pain    Anxiety    Aortic ectasia, abdominal    a.) CT abd 01/01/2022: 2.9 cm infrarenal abdominal aorta   Aortic stenosis    Asthma    Bladder cancer (HCC) 2023   BPH (benign prostatic hyperplasia) 09/06/2007   CAD s/p CABG    a.) 1990 s/p MI--> CABG x 3; b.) LHC/PCI 2002 -->  BMS (unk type) to LCx   Carotid arterial disease    a.) 08/2016 Carotid U/S: <39% bilat; b.) carotid doppler 10/12/2022: mild-to-moderate (<50%) bilateral bifurcation plaque.   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 06/2019 Echo (in setting of COVID): EF 35-40%, mild LVH, g1 DD, glob HK; b. 09/2019 Echo: EF 50-55%, Gr1 DD; c. 03/2021 Echo: EF 50-55%, no rwma, mild LVH, GrI DD, nl RV fxn. RVSP 44.80mmHg. Mild-mod dil LA. Triv MR. Mild-mod Ao sclerosis.   History of 2019 novel coronavirus disease (COVID-19) 05/20/2019   History of hiatal hernia    History of shingles    HLD (hyperlipidemia) 04/26/2007   HTN (hypertension) 04/30/2007   LBBB (left bundle branch block)    Long-term use of aspirin  therapy    Lumbar disc disease with radiculopathy    Migraines    Myocardial infarction (HCC)  1990   a.) resulted in 3v CABG   Myocardial infarction [facility]) 2002   a.) second cardiac event; PCI with placement of BMS (unknown type) to LCx   Neuropathy of both feet    OSA on CPAP    Osteoarthritis    Oxygen  deficiency    PAD (peripheral artery disease)    a. 07/2017 LE duplex: RSFA 75-20m, LSFA 75-47m, 50-74d; c. 08/2018 Periph Angio: No signif AoIliac dzs. Mod-sev Ca2+ RSFA w/ diff dzs throughout- 3 vessel runoff. Borderline signif LSFA dzs w/ mod-sev  Ca2+ vessels and 3 vessel runoff below the knee-->Med rx.   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   Right mid dorsum forearm - in situ - [facility]   Stroke (HCC)    T2DM (type 2 diabetes mellitus) (HCC)    Traumatic open wound of lower leg with delayed healing, left 12/28/2022    Surgical History: Past Surgical History:  Procedure Laterality Date   ABDOMINAL AORTOGRAM W/LOWER EXTREMITY [name] 09/12/2018   Procedure: ABDOMINAL AORTOGRAM W/LOWER EXTREMITY;  Surgeon: Darron Deatrice LABOR, MD;  [facility]: [facility];  Service: Cardiovascular;  Laterality: [name];   ANTERIOR CERVICAL DECOMP/DISCECTOMY FUSION [name] 07/11/2022   Anterior Cervical Decompression Fusion ,Interboy Prothesis,Plate/Screws , Cervical four-five,Cervical five-six Gavin, Reyes, MD)   APPENDECTOMY     rupture   BACK SURGERY     1984 and then [facility]   BLADDER INSTILLATION [name] 10/19/2021   Procedure: BLADDER INSTILLATION OF GEMCITABINE ;  Surgeon: Twylla Glendia BROCKS, MD;  Location: [facility] ORS;  Service: Urology;  Laterality: [name];   BLADDER INSTILLATION [name] 11/15/2022   Procedure: BLADDER INSTILLATION OF GEMCITABINE ;  Surgeon: Twylla Glendia BROCKS, MD;  Location: [facility] OR[facility];  Service: Urology;  Laterality: [name];   CARDIAC CATHETERIZATION [name] 07/07/2015   Procedure: Left Heart Cath and Cors/Grafts Angiography;  Surgeon: Evalene JINNY Lunger, MD;  Location: [facility];  Service: Cardiovascular;  Laterality: [name];   CATARACT EXTRACTION, BILATERAL  2009   CERVICAL SPINE SURGERY  12/2016   cervical stenosis Gavin)   COLONOSCOPY  03/2010   HP polyp, diverticulosis, rpt 10 yrs ([name])    CORONARY ANGIOPLASTY  11/30/2000   LCX   CORONARY ARTERY BYPASS GRAFT  1990   3 vessel    CORONARY STENT INTERVENTION [name] 01/13/2023   Procedure: CORONARY STENT INTERVENTION;  Surgeon: Darron Deatrice LABOR, MD;  Location: [facility];  Service: Cardiovascular;  Laterality: [name];   CYSTOSCOPY  12/23/2010   [facility]   CYSTOSCOPY WITH BIOPSY [name] 01/16/2024   Procedure: CYSTOSCOPY, WITH BIOPSY;  Surgeon: Twylla Glendia BROCKS, MD;  Location: [facility];  Service: Urology;  Laterality: [name];   EYE SURGERY     JOINT REPLACEMENT     KNEE ARTHROSCOPY Right    x2   LAMINOTOMY  1986   L5/S1 lumbar laminotomy for two ruptured discs/fusion   LEFT HEART CATH AND CORS/GRAFTS ANGIOGRAPHY [name] 01/19/2024   Procedure: LEFT HEART CATH AND CORS/GRAFTS ANGIOGRAPHY;  Surgeon: Mady Bruckner, MD;  Location: [facility];  Service: Cardiovascular;  Laterality: [name];   LUMBAR LAMINECTOMY/DECOMPRESSION MICRODISCECTOMY  [name] 07/13/2016   Procedure: LUMBAR TWO-THREE, LUMBAR THREE-FOUR, LUMBAR FOUR-FIVE LAMINECTOMY AND FORAMINOTOMY;  Surgeon: Reyes Budge, MD;  Location[facility];  Service: Neurosurgery;  Laterality: [name];  LAMINECTOMY AND FORAMINOTOMY L2-L3, L3-L4,L4-L5   LUMBAR SPINE SURGERY  06/2020   Bilateral redo laminectomy/laminotomy/foraminotomies/medial facetectomy to decompress the bilateral L4 and L5 nerve roots Gavin)   REVERSE SHOULDER ARTHROPLASTY Right 12/21/2023   Procedure: ARTHROPLASTY, SHOULDER, TOTAL, REVERSE;  Surgeon: Melita Drivers, MD;  Location: [facility];  Service: Orthopedics;  Laterality: Right;   RIGHT/LEFT HEART CATH AND CORONARY ANGIOGRAPHY Bilateral 01/13/2023   Procedure: RIGHT/LEFT HEART CATH AND CORONARY ANGIOGRAPHY;  Surgeon: Darron Deatrice LABOR, MD;  Location: [facility];  Service: Cardiovascular;  Laterality: Bilateral;   SPINE SURGERY     TOTAL HIP ARTHROPLASTY Bilateral 1999,2000   TOTAL KNEE ARTHROPLASTY Right 03/06/2017   Procedure: RIGHT TOTAL KNEE ARTHROPLASTY;   Surgeon: Melodi Lerner, MD;  Location: [facility];  Service: Orthopedics;  Laterality: Right;   TRANSURETHRAL RESECTION OF BLADDER TUMOR [name] 10/19/2021   Procedure: TRANSURETHRAL RESECTION OF BLADDER TUMOR (TURBT);  Surgeon: Twylla Glendia BROCKS, MD;  Location: [facility];  Service: Urology;  Laterality: [name];   TRANSURETHRAL RESECTION OF BLADDER TUMOR [name] 11/15/2022   Procedure: TRANSURETHRAL RESECTION OF BLADDER TUMOR (TURBT);  Surgeon: Twylla Glendia BROCKS, MD;  Location: [facility];  Service: Urology;  Laterality: [name];    Home Medications:  Allergies as of 05/17/2024       Reactions   Vioxx [rofecoxib] Other (See Comments)   Hemorrhage    Oxycodone  Other (See Comments)   Could not wake up , felt drunk   Spiriva  Respimat [tiotropium Bromide Monohydrate ] Other (See Comments)   Elevated bp   Contrast Media [iodinated Contrast Media] Itching, Rash   Delayed reaction post abdominal aortagram.    Morphine  Nausea Only, Other (See Comments)   Irritability morphine         Medication List        Accurate as of May 17, 2024  8:17 AM. If you have any questions, ask your nurse or doctor.          STOP taking these medications    sulfamethoxazole -trimethoprim  800-160 MG tablet Commonly known as: BACTRIM  DS Stopped by: Glendia BROCKS Twylla       TAKE these medications    albuterol  (2.5 MG/3ML) 0.083% nebulizer solution Commonly known as: PROVENTIL  TAKE 3 MLS BY NEBULIZATION EVERY 6 HOURSAS NEEDED FOR WHEEZING OR SHORTNESS OF BREATH   albuterol  108 (90 Base) MCG/ACT inhaler Commonly known as: VENTOLIN  HFA TAKE 2 PUFFS INTO LUNGS EVERY 6 HOURS ASNEEDED FOR WHEEZING OR SHORTNESS OF BREATH   aspirin  EC 81 MG tablet Take 1 tablet (81 mg total) by mouth daily.   atorvastatin  20 MG tablet Commonly known as: LIPITOR TAKE 1 TABLET BY MOUTH DAILY   docusate sodium  100 MG capsule Commonly known as: COLACE Take 1 capsule (100 mg total) by mouth daily as needed for mild constipation.    ezetimibe  10 MG tablet Commonly known as: ZETIA  TAKE 1 TABLET BY MOUTH ONCE DAILY   famotidine  20 MG tablet Commonly known as: PEPCID  Take 20 mg by mouth daily.   fluorouracil  5 % cream Commonly known as: EFUDEX  Apply to nose twice a day x 7 days   fluticasone  50 MCG/ACT nasal spray Commonly known as: FLONASE  Place 1 spray into both nostrils daily as needed for allergies or rhinitis.   furosemide  40 MG tablet Commonly known as: LASIX  Take 0.5 tablets (20 mg  total) by mouth daily as needed. Take 1 tablet (40 mg) daily as need for weight greater then 208.   gabapentin  600 MG tablet Commonly known as: NEURONTIN  Take 0.5 tablets (300 mg total) by mouth 2 (two) times daily AND 1 tablet (600 mg total) at bedtime.   Gemtesa  75 MG Tabs Generic drug: Vibegron  Take 1 tablet (75 mg total) by mouth daily. Started by: Glendia JAYSON Barba   HYDROcodone -acetaminophen  5-325 MG tablet Commonly known as: NORCO/VICODIN Take 1-2 tablets by mouth every 6 (six) hours as needed.   hydrocortisone  2.5 % cream Apply to chin and beside the nose at bedtime Tues, Thurs, Sat   isosorbide  mononitrate 30 MG 24 hr tablet Commonly known as: IMDUR  TAKE 1 TABLET BY MOUTH DAILY.   ketoconazole  2 % cream Commonly known as: NIZORAL  Apply to chin and beside the nose at bedtime Mon, Wed, Fri   losartan  25 MG tablet Commonly known as: COZAAR  Take 1 tablet (25 mg total) by mouth daily.   metoprolol  succinate 25 MG 24 hr tablet Commonly known as: Toprol  XL Take 0.5 tablets (12.5 mg total) by mouth daily.   montelukast  10 MG tablet Commonly known as: SINGULAIR  TAKE 1 TABLET BY MOUTH DAILY   mupirocin  ointment 2 % Commonly known as: BACTROBAN  Apply 1 Application topically 2 (two) times daily.   nitroGLYCERIN  0.4 MG SL tablet Commonly known as: NITROSTAT  Place 1 tablet (0.4 mg total) under the tongue every 5 (five) minutes as needed for chest pain.   ondansetron  8 MG tablet Commonly known as:  Zofran  Take 1 tablet (8 mg total) by mouth every 8 (eight) hours as needed for nausea or vomiting.   phenazopyridine  200 MG tablet Commonly known as: Pyridium  Take 1 tablet (200 mg total) by mouth 3 (three) times daily as needed for pain. One on day before treatment, then as needed   polyethylene glycol 17 g packet Commonly known as: MIRALAX  / GLYCOLAX  Take 17 g by mouth daily as needed for mild constipation.   prochlorperazine  10 MG tablet Commonly known as: COMPAZINE  Take 1 tablet (10 mg total) by mouth every 6 (six) hours as needed for nausea or vomiting.   ranolazine  500 MG 12 hr tablet Commonly known as: RANEXA  TAKE ONE (1) TABLET BY MOUTH TWO TIMES PER DAY   sodium bicarbonate  650 MG tablet Take 2 tabs (1300 mg) of sodium bicarbonate  (two 650 mg tabs) the night before and morning of therapy   spironolactone  25 MG tablet Commonly known as: ALDACTONE  Take 0.5 tablets (12.5 mg total) by mouth daily.   tamsulosin  0.4 MG Caps capsule Commonly known as: FLOMAX  TAKE 1 CAPSULE BY MOUTH AT BEDTIME   Trelegy Ellipta  200-62.5-25 MCG/ACT Aepb Generic drug: Fluticasone -Umeclidin-Vilant Inhale 1 puff into the lungs daily.   VITAMIN B-12 ER PO Take 1 capsule by mouth daily.        Allergies:  Allergies  Allergen Reactions   Vioxx [Rofecoxib] Other (See Comments)    Hemorrhage    Oxycodone  Other (See Comments)    Could not wake up , felt drunk   Spiriva  Respimat [Tiotropium Bromide Monohydrate ] Other (See Comments)    Elevated bp   Contrast Media [Iodinated Contrast Media] Itching and Rash    Delayed reaction post abdominal aortagram.    Morphine  Nausea Only and Other (See Comments)    Irritability  morphine     Family History: Family History  Problem Relation Age of Onset   Stroke Mother    Heart attack Mother  Diabetes Mother    Stroke Father    Heart disease Father    Polycystic kidney disease Father    Kidney disease Father    Cancer Sister         throat   Polycystic kidney disease Sister    Kidney disease Sister    Diabetes Brother        1/2 brother   Cancer Other        5/7 nephews with lung cancer    Social History:  reports that he quit smoking about 35 years ago. His smoking use included cigarettes. He started smoking about 60 years ago. He has a 25 pack-year smoking history. He has been exposed to tobacco smoke. He has never used smokeless tobacco. He reports that he does not drink alcohol and does not use drugs.   Physical Exam: BP 138/70   Pulse 79   Ht 6' (1.829 m)   Wt 215 lb 1.6 oz (97.6 kg)   BMI 29.17 kg/m   Constitutional:  Alert, No acute distress. HEENT: [place] AT Respiratory: Normal respiratory effort, no increased work of breathing. Psychiatric: Normal mood and affect.   Pertinent Imaging: Renal ultrasound images were per reviewed and interpreted  US  RENAL  Narrative CLINICAL DATA:  LEFT KIDNEY MASS  EXAM: RENAL / URINARY TRACT ULTRASOUND COMPLETE  COMPARISON:  04/13/2022  FINDINGS: Right Kidney:  Renal measurements: 9.7 x 5.7 x 5.3 cm = volume: 54 mL.Normal echogenicity. No mass. No hydronephrosis or nephrolithiasis.  Left Kidney:  Renal measurements: 9.1 x 4 x 4.2 cm = volume: 78 mL. Normal echogenicity. Tiny hypoechogenicity in the upper pole measuring 5 x 4 x 3 mm. No hydronephrosis or nephrolithiasis.  Bladder:  Trabeculation of the bladder wall.  Other:  None.  IMPRESSION: 1. No hydronephrosis or nephrolithiasis. Tiny hypoechogenicity in the upper pole of the left kidney, measuring 5 x 4 x 3 mm, possibly a proteinaceous or hemorrhagic cyst. A solid neoplasm can not be excluded by ultrasound. Sonographic follow-up in 6 months versus nonemergent multiphase abdominal MRI with IV contrast recommended. 2. Trabeculation of the bladder wall, which can be seen in the setting of prostatomegaly. Correlation with urinalysis recommended to exclude acute cystitis.  These results will  be called to the ordering clinician or representative by the Radiologist Assistant and communication documented in the PACS or [employer].   Electronically Signed By: Rogelia Myers M.D. On: 05/06/2024 18:05    Assessment & Plan:    1.  Left renal mass 5 mm upper pole mass incompletely characterized on ultrasound.  Most likely a small renal cyst Schedule renal mass protocol MRI 6 months  2.  Urothelial carcinoma bladder Scheduled for surveillance cystoscopy December 2025  3.  Urinary frequency/urgency Trial of Gemtesa  75 mg daily-samples given   Stassi Fadely C Isaul Landi, MD  [facility] [address] [place], [place] [address] [phone]

## 2024-05-17 NOTE — Patient Instructions (Signed)
 [facility] - A DEPT OF [facility]  Discharge Instructions: Thank you for choosing [facility] to provide your oncology and hematology care.   If you have a lab appointment with the Cancer Center, please go directly to the Cancer Center and check in at the registration area.   Wear comfortable clothing and clothing appropriate for easy access to any Portacath or PICC line.   We strive to give you quality time with your provider. You may need to reschedule your appointment if you arrive late (15 or more minutes).  Arriving late affects you and other patients whose appointments are after yours.  Also, if you miss three or more appointments without notifying the office, you may be dismissed from the clinic at the provider's discretion.      For prescription refill requests, have your pharmacy contact our office and allow 72 hours for refills to be completed.    Today you received the following chemotherapy and/or immunotherapy agents gemzar  and taxotere       To help prevent nausea and vomiting after your treatment, we encourage you to take your nausea medication as directed.  BELOW ARE SYMPTOMS THAT SHOULD BE REPORTED IMMEDIATELY: *FEVER GREATER THAN 100.4 F (38 C) OR HIGHER *CHILLS OR SWEATING *NAUSEA AND VOMITING THAT IS NOT CONTROLLED WITH YOUR NAUSEA MEDICATION *UNUSUAL SHORTNESS OF BREATH *UNUSUAL BRUISING OR BLEEDING *URINARY PROBLEMS (pain or burning when urinating, or frequent urination) *BOWEL PROBLEMS (unusual diarrhea, constipation, pain near the anus) TENDERNESS IN MOUTH AND THROAT WITH OR WITHOUT PRESENCE OF ULCERS (sore throat, sores in mouth, or a toothache) UNUSUAL RASH, SWELLING OR PAIN  UNUSUAL VAGINAL DISCHARGE OR ITCHING   Items with * indicate a potential emergency and should be followed up as soon as possible or go to the Emergency Department if any problems should occur.  Please show the CHEMOTHERAPY ALERT CARD or  IMMUNOTHERAPY ALERT CARD at check-in to the Emergency Department and triage nurse.  Should you have questions after your visit or need to cancel or reschedule your appointment, please contact [facility] - A DEPT OF [facility]  Dept: [phone]  and follow the prompts.  Office hours are 8:00 a.m. to 4:30 p.m. Monday - Friday. Please note that voicemails left after 4:00 p.m. may not be returned until the following business day.  We are closed weekends and major holidays. You have access to a nurse at all times for urgent questions. Please call the main number to the clinic Dept: [phone] and follow the prompts.   For any non-urgent questions, you may also contact your provider using MyChart. We now offer e-Visits for anyone 61 and older to request care online for non-urgent symptoms. For details visit [email or web address].   Also download the MyChart app! Go to the app store, search MyChart, open the app, select [facility], and log in with your MyChart username and password.

## 2024-05-17 NOTE — Assessment & Plan Note (Addendum)
 Will start maintenance gem/doce Sodium bicarb the night before and the morning of treatment Pyridium  200 mg as needed 3 times a day on day 1 and 2 if having urinary pain Fridays works well. Next treatment on 11/7.

## 2024-05-31 ENCOUNTER — Ambulatory Visit
Admission: RE | Admit: 2024-05-31 | Discharge: 2024-05-31 | Disposition: A | Discharge status: Home / Self Care | Source: Ambulatory Visit | Attending: Urology | Admitting: Urology

## 2024-05-31 DIAGNOSIS — C679 Malignant neoplasm of bladder, unspecified: Secondary | ICD-10-CM | POA: Diagnosis not present

## 2024-05-31 DIAGNOSIS — N281 Cyst of kidney, acquired: Secondary | ICD-10-CM | POA: Diagnosis not present

## 2024-05-31 DIAGNOSIS — R935 Abnormal findings on diagnostic imaging of other abdominal regions, including retroperitoneum: Secondary | ICD-10-CM | POA: Diagnosis not present

## 2024-05-31 DIAGNOSIS — K7689 Other specified diseases of liver: Secondary | ICD-10-CM | POA: Diagnosis not present

## 2024-05-31 DIAGNOSIS — K8689 Other specified diseases of pancreas: Secondary | ICD-10-CM | POA: Diagnosis not present

## 2024-05-31 MED ORDER — GADOBUTROL 1 MMOL/ML IV SOLN
9.0000 mL | Freq: Once | INTRAVENOUS | Status: AC | PRN
  Administered 2024-05-31: 9 mL via INTRAVENOUS

## 2024-06-03 ENCOUNTER — Ambulatory Visit: Payer: Self-pay | Admitting: Urology

## 2024-06-11 ENCOUNTER — Other Ambulatory Visit: Payer: Self-pay | Admitting: Urology

## 2024-06-11 ENCOUNTER — Other Ambulatory Visit: Payer: Self-pay | Admitting: *Deleted

## 2024-06-11 MED ORDER — GEMTESA 75 MG PO TABS: 75.0000 mg | ORAL_TABLET | Freq: Every day | ORAL | 11 refills | Status: AC

## 2024-06-12 ENCOUNTER — Other Ambulatory Visit: Payer: Self-pay

## 2024-06-13 ENCOUNTER — Telehealth: Payer: Self-pay

## 2024-06-13 NOTE — Telephone Encounter (Signed)
 Wife called she would like to have appts made for November chemo Arland Legions BSN RN

## 2024-06-19 ENCOUNTER — Ambulatory Visit

## 2024-06-19 DIAGNOSIS — W908XXA Exposure to other nonionizing radiation, initial encounter: Secondary | ICD-10-CM | POA: Diagnosis not present

## 2024-06-19 DIAGNOSIS — L821 Other seborrheic keratosis: Secondary | ICD-10-CM

## 2024-06-19 DIAGNOSIS — Z85828 Personal history of other malignant neoplasm of skin: Secondary | ICD-10-CM

## 2024-06-19 DIAGNOSIS — L578 Other skin changes due to chronic exposure to nonionizing radiation: Secondary | ICD-10-CM | POA: Diagnosis not present

## 2024-06-19 DIAGNOSIS — L570 Actinic keratosis: Secondary | ICD-10-CM

## 2024-06-19 DIAGNOSIS — Z1283 Encounter for screening for malignant neoplasm of skin: Secondary | ICD-10-CM | POA: Diagnosis not present

## 2024-06-19 DIAGNOSIS — D1801 Hemangioma of skin and subcutaneous tissue: Secondary | ICD-10-CM

## 2024-06-19 DIAGNOSIS — D229 Melanocytic nevi, unspecified: Secondary | ICD-10-CM

## 2024-06-19 DIAGNOSIS — L820 Inflamed seborrheic keratosis: Secondary | ICD-10-CM | POA: Diagnosis not present

## 2024-06-19 DIAGNOSIS — L814 Other melanin hyperpigmentation: Secondary | ICD-10-CM

## 2024-06-19 NOTE — Patient Instructions (Addendum)
 Cryotherapy Aftercare  Wash gently with soap and water everyday.   Apply Vaseline and Band-Aid daily until healed.   Due to recent changes in healthcare laws, you may see results of your pathology and/or laboratory studies on MyChart before the doctors have had a chance to review them. We understand that in some cases there may be results that are confusing or concerning to you. Please understand that not all results are received at the same time and often the doctors may need to interpret multiple results in order to provide you with the best plan of care or course of treatment. Therefore, we ask that you please give us  2 business days to thoroughly review all your results before contacting the office for clarification. Should we see a critical lab result, you will be contacted sooner.   If You Need Anything After Your Visit  If you have any questions or concerns for your doctor, please call our main line at [phone] and press option 4 to reach your doctor's medical assistant. If no one answers, please leave a voicemail as directed and we will return your call as soon as possible. Messages left after 4 pm will be answered the following business day.   You may also send us  a message via MyChart. We typically respond to MyChart messages within 1-2 business days.  For prescription refills, please ask your pharmacy to contact our office. Our fax number is [phone].  If you have an urgent issue when the clinic is closed that cannot wait until the next business day, you can page your doctor at the number below.    Please note that while we do our best to be available for urgent issues outside of office hours, we are not available 24/7.   If you have an urgent issue and are unable to reach us , you may choose to seek medical care at your doctor's office, retail clinic, urgent care center, or emergency room.  If you have a medical emergency, please immediately call 911 or go to the emergency  department.  Pager Numbers  - Dr. Hester: [phone]  - Dr. Jackquline: [phone]  - Dr. Claudene: [phone]   - Dr. Raymund: [phone]  In the event of inclement weather, please call our main line at [phone] for an update on the status of any delays or closures.  Dermatology Medication Tips: Please keep the boxes that topical medications come in in order to help keep track of the instructions about where and how to use these. Pharmacies typically print the medication instructions only on the boxes and not directly on the medication tubes.   If your medication is too expensive, please contact our office at [phone] option 4 or send us  a message through MyChart.   We are unable to tell what your co-pay for medications will be in advance as this is different depending on your insurance coverage. However, we may be able to find a substitute medication at lower cost or fill out paperwork to get insurance to cover a needed medication.   If a prior authorization is required to get your medication covered by your insurance company, please allow us  1-2 business days to complete this process.  Drug prices often vary depending on where the prescription is filled and some pharmacies may offer cheaper prices.  The website www.goodrx.com contains coupons for medications through different pharmacies. The prices here do not account for what the cost may be with help from insurance (it may be cheaper with your insurance),  but the website can give you the price if you did not use any insurance.  - You can print the associated coupon and take it with your prescription to the pharmacy.  - You may also stop by our office during regular business hours and pick up a GoodRx coupon card.  - If you need your prescription sent electronically to a different pharmacy, notify our office through [facility] or by phone at [phone] option 4.     Si Usted Necesita Algo Despus de Su  Visita  Tambin puede enviarnos un mensaje a travs de Clinical cytogeneticist. Por lo general respondemos a los mensajes de MyChart en el transcurso de 1 a 2 das hbiles.  Para renovar recetas, por favor pida a su farmacia que se ponga en contacto con nuestra oficina. Randi lakes de fax es [place] [phone].  Si tiene un asunto urgente cuando la clnica est cerrada y que no puede esperar hasta el siguiente da hbil, puede llamar/localizar a su doctor(a) al nmero que aparece a continuacin.   Por favor, tenga en cuenta que aunque hacemos todo lo posible para estar disponibles para asuntos urgentes fuera del horario de [place], no estamos disponibles las 24 horas del da, los 7 [address]  [address] de la [place].   Si tiene un problema urgente y no puede comunicarse con nosotros, puede optar por buscar atencin mdica  en el consultorio de su doctor(a), en una clnica privada, en un centro de atencin urgente o en una sala de emergencias.  Si tiene Engineer, drilling, por favor llame inmediatamente al 911 o vaya a la sala de emergencias.  Nmeros de bper  - Dr. Hester: [phone]  - Dra. Jackquline: [phone]  - Dr. Claudene: [phone]  - Dra. Kitts: [phone]  En caso de inclemencias del [place], por favor llame a nuestra lnea principal al [phone] para una actualizacin sobre el estado de cualquier retraso o cierre.  Consejos para la medicacin en dermatologa: Por favor, guarde las cajas en las que vienen los medicamentos de uso tpico para ayudarle a seguir las instrucciones sobre dnde y cmo usarlos. Las farmacias generalmente imprimen las instrucciones del medicamento slo en las cajas y no directamente en los tubos del [place].   Si su medicamento es muy caro, por favor, pngase en contacto con landry rieger llamando al [phone] y presione la opcin 4 o envenos un mensaje a travs de Clinical cytogeneticist.   No podemos decirle cul ser su copago por los medicamentos por adelantado ya que esto  es diferente dependiendo de la cobertura de su seguro. Sin embargo, es posible que podamos encontrar un medicamento sustituto a Audiological scientist un formulario para que el seguro cubra el medicamento que se considera necesario.   Si se requiere una autorizacin previa para que su compaa de seguros malta su medicamento, por favor permtanos de 1 a 2 das hbiles para completar este proceso.  Los precios de los medicamentos varan con frecuencia dependiendo del Environmental consultant de dnde se surte la receta y alguna farmacias pueden ofrecer precios ms baratos.  El sitio web www.goodrx.com tiene cupones para medicamentos de Health and safety inspector. Los precios aqu no tienen en cuenta lo que podra costar con la ayuda del seguro (puede ser ms barato con su seguro), pero el sitio web puede darle el precio si no utiliz Tourist information centre manager.  - Puede imprimir el cupn correspondiente y llevarlo con su receta a la farmacia.  - Tambin puede pasar por nuestra oficina durante el horario de atencin regular y  recoger una tarjeta de cupones de GoodRx.  - Si necesita que su receta se enve electrnicamente a una farmacia diferente, informe a nuestra oficina a travs de MyChart de [place] o por telfono llamando al [phone] y presione la opcin 4.

## 2024-06-19 NOTE — Progress Notes (Signed)
 Subjective   [name] is a 83 y.o. male who presents for the following: The patient has spots on his shoulders face and ears to be evaluated, some may be new or changing and the patient may have concern these could be cancer.  Patient is established patient   Review of Systems:    No other skin or systemic complaints except as noted in HPI or Assessment and Plan.  The following portions of the chart were reviewed this encounter and updated as appropriate: medications, allergies, medical history  Relevant Medical History:  Personal history of non melanoma skin cancer - see medical history for full details   Objective  Well appearing patient in no apparent distress; mood and affect are within normal limits. Examination was performed of the: Waist Up Skin Exam: scalp, head, eyes, ears, nose, lips, neck, chest, axillae, upper extremities, abdomen, back, hands, fingers, fingernails   Examination notable for: Angioma(s): Scattered red vascular papule(s)  , Lentigo/lentigines: Scattered pigmented macules that are tan to brown in color and are somewhat non-uniform in shape and concentrated in the sun-exposed areas, Nevus/nevi: Scattered well-demarcated, regular, pigmented macule(s) and/or papule(s)  , Seborrheic Keratosis(es): Stuck-on appearing keratotic papule(s) on the trunk, some  irritated with redness, crusting, edema, and/or partial avulsion, Actinic Damage/Elastosis: chronic sun damage: dyspigmentation, telangiectasia, and wrinkling, Actinic keratosis: Scaly erythematous macule(s) concentrated on sun exposed areas   Examination limited by: pants, deferred removal    left shouder, left temple (2) Stuck-on, waxy, tan-brown papules and plaques -- Discussed benign etiology and prognosis.  ears x 4 (4) Erythematous thin papules/macules with gritty scale.   Assessment & Plan   SKIN CANCER SCREENING PERFORMED TODAY.  BENIGN SKIN FINDINGS  - Lentigines  - Seborrheic keratoses  -  Hemangiomas   - Nevus/Multiple Benign Nevi - Reassurance provided regarding the benign appearance of lesions noted on exam today; no treatment is indicated in the absence of symptoms/changes. - Reinforced importance of photoprotective strategies including liberal and frequent sunscreen use of a broad-spectrum SPF 30 or greater, use of protective clothing, and sun avoidance for prevention of cutaneous malignancy and photoaging.  Counseled patient on the importance of regular self-skin monitoring as well as routine clinical skin examinations as scheduled.   ACTINIC DAMAGE - Chronic condition, secondary to cumulative UV/sun exposure - Recommend daily broad spectrum sunscreen SPF 30+ to sun-exposed areas, reapply every 2 hours as needed.  - Staying in the shade or wearing long sleeves, sun glasses (UVA+UVB protection) and wide brim hats (4-inch brim around the entire circumference of the hat) are also recommended for sun protection.  - Call for new or changing lesions.  Personal history of non melanoma skin cancer  - Reviewed medical history for full details  - Reviewed sun protective measures as above - Encouraged full body skin exams     Level of service outlined above   Procedures, orders, diagnosis for this visit:  INFLAMED SEBORRHEIC KERATOSIS (2) left shouder, left temple (2) Symptomatic, irritating, patient would like treated.  Destruction of lesion - left shouder, left temple (2) Complexity: simple   Destruction method: cryotherapy   Informed consent: discussed and consent obtained   Timeout:  patient name, date of birth, surgical site, and procedure verified Lesion destroyed using liquid nitrogen: Yes   Region frozen until ice ball extended beyond lesion: Yes   Outcome: patient tolerated procedure well with no complications   Post-procedure details: wound care instructions given    AK (ACTINIC KERATOSIS) (4) ears  x 4 (4) Destruction of lesion - ears x 4 (4) Complexity: simple    Destruction method: cryotherapy   Informed consent: discussed and consent obtained   Timeout:  patient name, date of birth, surgical site, and procedure verified Lesion destroyed using liquid nitrogen: Yes   Region frozen until ice ball extended beyond lesion: Yes   Outcome: patient tolerated procedure well with no complications   Post-procedure details: wound care instructions given     Inflamed seborrheic keratosis -     Destruction of lesion  AK (actinic keratosis) -     Destruction of lesion       Return to clinic: No follow-ups on file.  IFay Kirks, CMA, am acting as scribe for Lauraine JAYSON Kanaris, MD .   Documentation: I have reviewed the above documentation for accuracy and completeness, and I agree with the above.  Lauraine JAYSON Kanaris, MD

## 2024-06-21 ENCOUNTER — Ambulatory Visit: Admitting: Nurse Practitioner

## 2024-06-21 ENCOUNTER — Institutional Professional Consult (permissible substitution): Admitting: Cardiovascular Disease

## 2024-06-21 ENCOUNTER — Inpatient Hospital Stay

## 2024-06-21 ENCOUNTER — Other Ambulatory Visit

## 2024-06-21 ENCOUNTER — Ambulatory Visit

## 2024-06-21 VITALS — BP 128/55 | HR 71 | Temp 97.4°F | Resp 18 | Wt 217.2 lb

## 2024-06-21 DIAGNOSIS — C679 Malignant neoplasm of bladder, unspecified: Secondary | ICD-10-CM

## 2024-06-21 DIAGNOSIS — Z5111 Encounter for antineoplastic chemotherapy: Secondary | ICD-10-CM | POA: Diagnosis not present

## 2024-06-21 DIAGNOSIS — N1832 Chronic kidney disease, stage 3b: Secondary | ICD-10-CM | POA: Insufficient documentation

## 2024-06-21 LAB — CBC WITH DIFFERENTIAL (CANCER CENTER ONLY)
Abs Immature Granulocytes: 0.01 K/uL (ref 0.00–0.07)
Basophils Absolute: 0.1 K/uL (ref 0.0–0.1)
Basophils Relative: 1 %
Eosinophils Absolute: 0.3 K/uL (ref 0.0–0.5)
Eosinophils Relative: 5 %
HCT: 37.2 % — ABNORMAL LOW (ref 39.0–52.0)
Hemoglobin: 12.5 g/dL — ABNORMAL LOW (ref 13.0–17.0)
Immature Granulocytes: 0 %
Lymphocytes Relative: 16 %
Lymphs Abs: 1 K/uL (ref 0.7–4.0)
MCH: 32.8 pg (ref 26.0–34.0)
MCHC: 33.6 g/dL (ref 30.0–36.0)
MCV: 97.6 fL (ref 80.0–100.0)
Monocytes Absolute: 0.5 K/uL (ref 0.1–1.0)
Monocytes Relative: 8 %
Neutro Abs: 4.4 K/uL (ref 1.7–7.7)
Neutrophils Relative %: 70 %
Platelet Count: 171 K/uL (ref 150–400)
RBC: 3.81 MIL/uL — ABNORMAL LOW (ref 4.22–5.81)
RDW: 12.5 % (ref 11.5–15.5)
WBC Count: 6.2 K/uL (ref 4.0–10.5)
nRBC: 0 % (ref 0.0–0.2)

## 2024-06-21 LAB — URINALYSIS, COMPLETE (UACMP) WITH MICROSCOPIC
Bacteria, UA: NONE SEEN
Bilirubin Urine: NEGATIVE
Glucose, UA: NEGATIVE mg/dL
Hgb urine dipstick: NEGATIVE
Ketones, ur: NEGATIVE mg/dL
Leukocytes,Ua: NEGATIVE
Nitrite: POSITIVE — AB
Protein, ur: NEGATIVE mg/dL
Specific Gravity, Urine: 1.014 (ref 1.005–1.030)
pH: 5 (ref 5.0–8.0)

## 2024-06-21 LAB — CMP (CANCER CENTER ONLY)
ALT: 9 U/L (ref 0–44)
AST: 15 U/L (ref 15–41)
Albumin: 3.9 g/dL (ref 3.5–5.0)
Alkaline Phosphatase: 38 U/L (ref 38–126)
Anion gap: 6 (ref 5–15)
BUN: 25 mg/dL — ABNORMAL HIGH (ref 8–23)
CO2: 29 mmol/L (ref 22–32)
Calcium: 9 mg/dL (ref 8.9–10.3)
Chloride: 105 mmol/L (ref 98–111)
Creatinine: 1.48 mg/dL — ABNORMAL HIGH (ref 0.61–1.24)
GFR, Estimated: 47 mL/min — ABNORMAL LOW (ref >60)
Glucose, Bld: 149 mg/dL — ABNORMAL HIGH (ref 70–99)
Potassium: 4.3 mmol/L (ref 3.5–5.1)
Sodium: 140 mmol/L (ref 135–145)
Total Bilirubin: 0.5 mg/dL (ref 0.0–1.2)
Total Protein: 6.1 g/dL — ABNORMAL LOW (ref 6.5–8.1)

## 2024-06-21 MED ORDER — LIDOCAINE HCL URETHRAL/MUCOSAL 2 % EX GEL
1.0000 | Freq: Once | CUTANEOUS | Status: AC
  Administered 2024-06-21: 1 via URETHRAL
  Filled 2024-06-21: qty 1

## 2024-06-21 MED ORDER — OXYBUTYNIN CHLORIDE 5 MG PO TABS
5.0000 mg | ORAL_TABLET | Freq: Once | ORAL | Status: AC
  Administered 2024-06-21: 5 mg via ORAL
  Filled 2024-06-21: qty 1

## 2024-06-21 MED ORDER — PROCHLORPERAZINE MALEATE 10 MG PO TABS
10.0000 mg | ORAL_TABLET | Freq: Once | ORAL | Status: AC
  Administered 2024-06-21: 10 mg via ORAL
  Filled 2024-06-21: qty 1

## 2024-06-21 MED ORDER — SODIUM CHLORIDE 0.9 % IV SOLN: INTRAVENOUS | Status: DC

## 2024-06-21 MED ORDER — SODIUM BICARBONATE 650 MG PO TABS: ORAL_TABLET | ORAL | 0 refills | Status: AC

## 2024-06-21 MED ORDER — OXYBUTYNIN CHLORIDE 5 MG PO TABS: 5.0000 mg | ORAL_TABLET | Freq: Once | ORAL | Status: DC | PRN

## 2024-06-21 MED ORDER — SODIUM CHLORIDE (PF) 0.9 % IJ SOLN
1000.0000 mg | Freq: Once | INTRAVENOUS | Status: AC
  Administered 2024-06-21: 1000 mg via INTRAVESICAL
  Filled 2024-06-21: qty 26.3

## 2024-06-21 MED ORDER — SODIUM CHLORIDE (PF) 0.9 % IJ SOLN
37.5000 mg | Freq: Once | INTRAVENOUS | Status: AC
  Administered 2024-06-21: 37.5 mg via INTRAVESICAL
  Filled 2024-06-21: qty 3.75

## 2024-06-21 NOTE — Progress Notes (Signed)
 [facility] OFFICE PROGRESS NOTE  Patient Care Team: Rilla Baller, MD as PCP - General (Family Medicine) Perla Evalene PARAS, MD as Consulting Physician (Cardiology) Tamea Dedra CROME, MD as Consulting Physician (Pulmonary Disease) Darron Deatrice LABOR, MD as Consulting Physician (Cardiology) Pa, [facility] (Ophthalmology) Tina Pauletta BROCKS, MD as Consulting Physician (Oncology)  [name] is a 83 y.o.male with history of DVT, CKD 3, CAD with MI, CABG, emphysema, hypertension, hyperlipidemia, type 2 diabetes, carotid artery disease, BPH, DDD 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   Patient started intravesical gemcitabine  with docetaxel  without issue.   No symptoms of UTI.   He started maintenance today on 10/3.  Tolerating well. Will proceed with treatment today.  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 Refilled today Pyridium  200 mg as needed 3 times a day on day 1 and 2 if having urinary pain Fridays works well. Next treatment on 12/5. Follow up with Dr. Twylla on 12/16 as planned for cystoscopy Return in January for follow up with me and treatment. Stage 3b chronic kidney disease (HCC) Fluid restrictions only in the morning before treatment.  Advised to increase fluid after treatment, and goal of 64 ounces everyday  Return in Dec for treatment and again in January with lab, see me and infusion.  Orders Placed This Encounter  Procedures   CBC with Differential (Cancer Center Only)    Standing Status:   Future    Expected Date:   10/11/2024    Expiration Date:   10/11/2025   CMP (Cancer Center only)    Standing Status:   Future    Expected Date:   10/11/2024    Expiration Date:   10/11/2025   Urinalysis, Complete w Microscopic    Standing Status:   Future    Expected Date:   10/11/2024     Expiration Date:   10/11/2025     Pauletta BROCKS Tina, MD  INTERVAL HISTORY: Patient returns for follow-up. Overall feeling well. No dysuria, pain, hematuria or difficulty urinating.   Oncology History  Urothelial carcinoma of bladder (HCC)  10/19/2021 Pathology Results   DIAGNOSIS:  A. URINARY BLADDER TUMOR; TRANSURETHRAL RESECTION:  - NONINVASIVE PAPILLARY UROTHELIAL CARCINOMA, HIGH-GRADE (WHO/ISUP).  - MUSCULARIS PROPRIA PRESENT AND UNINVOLVED.   B. URINARY BLADDER, TUMOR BASE; TRANSURETHRAL RESECTION:  - BENIGN MUSCULARIS PROPRIA.  - NO DEFINITE INTACT UROTHELIUM IDENTIFIED.  - NEGATIVE FOR MALIGNANCY.   C. URINARY BLADDER, LEFT POSTERIOR WALL; TRANSURETHRAL RESECTION:  - NONINVASIVE PAPILLARY UROTHELIAL CARCINOMA, LOW-GRADE (WHO/ISUP).  - NO MUSCULARIS PROPRIA IDENTIFIED.    11/12/2021 Initial Diagnosis   Urothelial carcinoma of bladder (HCC)   11/15/2022 Pathology Results   IAGNOSIS:  A. URINARY BLADDER, RIGHT LATERAL WALL; TRANSURETHRAL RESECTION:  - NONINVASIVE PAPILLARY UROTHELIAL CARCINOMA, HIGH-GRADE (WHO/ISUP).  - NO MUSCULARIS PROPRIA IDENTIFIED.   B. URINARY BLADDER TUMOR BASE, RIGHT LATERAL WALL; TRANSURETHRAL BIOPSY:  - PARTIALLY DENUDED, BENIGN APPEARING UROTHELIAL TISSUE.  - MUSCULARIS PROPRIA PRESENT AND UNINVOLVED.   C. URINARY BLADDER, LEFT LATERAL WALL; TRANSURETHRAL RESECTION:  - NONINVASIVE PAPILLARY UROTHELIAL CARCINOMA, LOW-GRADE (WHO/ISUP).  - NO MUSCULARIS PROPRIA IDENTIFIED.    01/16/2024 Pathology Results   1. 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,    03/15/2024 -  Chemotherapy   Patient is on Treatment Plan : BLADDER Gemcitabine  (1000),  Docetaxel  (37.5) INTRAVESICAL q7d     03/15/2024 Cancer Staging   Staging form: Urinary Bladder, AJCC 8th Edition - Clinical: Stage 0is (cTis, cN0, cM0) - Signed by Tina Pauletta BROCKS, MD on 03/15/2024 WHO/ISUP grade (low/high): High Grade Histologic grading  system: 2 grade system      PHYSICAL EXAMINATION: ECOG PERFORMANCE STATUS: 3 - Symptomatic, >50% confined to bed  VSS  GENERAL: alert, no distress and comfortable in bed GU: Urine catheter in place to start treatment   Relevant data reviewed during this visit included labs.  New labs ordered.

## 2024-06-21 NOTE — Patient Instructions (Signed)
 [facility] - A DEPT OF [facility]  Discharge Instructions: Thank you for choosing [facility] to provide your oncology and hematology care.   If you have a lab appointment with the Cancer Center, please go directly to the Cancer Center and check in at the registration area.   Wear comfortable clothing and clothing appropriate for easy access to any Portacath or PICC line.   We strive to give you quality time with your provider. You may need to reschedule your appointment if you arrive late (15 or more minutes).  Arriving late affects you and other patients whose appointments are after yours.  Also, if you miss three or more appointments without notifying the office, you may be dismissed from the clinic at the provider's discretion.      For prescription refill requests, have your pharmacy contact our office and allow 72 hours for refills to be completed.    Today you received the following chemotherapy and/or immunotherapy agents gemzar  and taxotere       To help prevent nausea and vomiting after your treatment, we encourage you to take your nausea medication as directed.  BELOW ARE SYMPTOMS THAT SHOULD BE REPORTED IMMEDIATELY: *FEVER GREATER THAN 100.4 F (38 C) OR HIGHER *CHILLS OR SWEATING *NAUSEA AND VOMITING THAT IS NOT CONTROLLED WITH YOUR NAUSEA MEDICATION *UNUSUAL SHORTNESS OF BREATH *UNUSUAL BRUISING OR BLEEDING *URINARY PROBLEMS (pain or burning when urinating, or frequent urination) *BOWEL PROBLEMS (unusual diarrhea, constipation, pain near the anus) TENDERNESS IN MOUTH AND THROAT WITH OR WITHOUT PRESENCE OF ULCERS (sore throat, sores in mouth, or a toothache) UNUSUAL RASH, SWELLING OR PAIN  UNUSUAL VAGINAL DISCHARGE OR ITCHING   Items with * indicate a potential emergency and should be followed up as soon as possible or go to the Emergency Department if any problems should occur.  Please show the CHEMOTHERAPY ALERT CARD or  IMMUNOTHERAPY ALERT CARD at check-in to the Emergency Department and triage nurse.  Should you have questions after your visit or need to cancel or reschedule your appointment, please contact [facility] - A DEPT OF [facility]  Dept: [phone]  and follow the prompts.  Office hours are 8:00 a.m. to 4:30 p.m. Monday - Friday. Please note that voicemails left after 4:00 p.m. may not be returned until the following business day.  We are closed weekends and major holidays. You have access to a nurse at all times for urgent questions. Please call the main number to the clinic Dept: [phone] and follow the prompts.   For any non-urgent questions, you may also contact your provider using MyChart. We now offer e-Visits for anyone 61 and older to request care online for non-urgent symptoms. For details visit [email or web address].   Also download the MyChart app! Go to the app store, search MyChart, open the app, select [facility], and log in with your MyChart username and password.

## 2024-06-21 NOTE — Assessment & Plan Note (Addendum)
 Fluid restrictions only in the morning before treatment.  Advised to increase fluid after treatment, and goal of 64 ounces everyday

## 2024-06-21 NOTE — Assessment & Plan Note (Addendum)
 C2 maintenance gem/doce Sodium bicarb 1300 mg the night before and the morning of treatment. Refilled today Pyridium  200 mg as needed 3 times a day on day 1 and 2 if having urinary pain Fridays works well. Next treatment on 12/5. Follow up with Dr. Twylla on 12/16 as planned for cystoscopy Return in January for follow up with me and treatment.

## 2024-06-27 DIAGNOSIS — H903 Sensorineural hearing loss, bilateral: Secondary | ICD-10-CM | POA: Diagnosis not present

## 2024-06-28 ENCOUNTER — Other Ambulatory Visit: Payer: Self-pay | Admitting: Cardiovascular Disease

## 2024-06-28 ENCOUNTER — Other Ambulatory Visit: Payer: Self-pay | Admitting: Medical

## 2024-07-01 MED ORDER — ATORVASTATIN CALCIUM 20 MG PO TABS: 20.0000 mg | ORAL_TABLET | Freq: Every day | ORAL | 3 refills | Status: AC

## 2024-07-08 DIAGNOSIS — M549 Dorsalgia, unspecified: Secondary | ICD-10-CM | POA: Diagnosis not present

## 2024-07-17 DIAGNOSIS — M720 Palmar fascial fibromatosis [Dupuytren]: Secondary | ICD-10-CM | POA: Diagnosis not present

## 2024-07-18 NOTE — Progress Notes (Signed)
 Patient Care Team: Rilla Baller, MD as PCP - General (Family Medicine) Perla Evalene PARAS, MD as Consulting Physician (Cardiology) Tamea Dedra CROME, MD as Consulting Physician (Pulmonary Disease) Darron Deatrice LABOR, MD as Consulting Physician (Cardiology) Pa, [facility] (Ophthalmology) Tina Pauletta BROCKS, MD as Consulting Physician (Oncology) Twylla Glendia BROCKS, MD (Urology)  Clinic Day:  07/19/2024  Referring physician: Tina Pauletta BROCKS, MD  ASSESSMENT & PLAN:   Assessment & Plan: Urothelial carcinoma of bladder (HCC) C4 maintenance gem/doce Sodium bicarb 1300 mg the night before and the morning of treatment. Refilled today Pyridium  200 mg as needed 3 times a day on day 1 and 2 if having urinary pain Fridays works well. Follow up with Dr. Twylla on 12/16 as planned for cystoscopy Return in January for labs, follow-up with Dr. Twylla, and next  intravesical chemotherapy.   Chronic kidney disease Creatinine slightly elevated today 1.83, BUN 33, and eGFR is 36.  Urinalysis does show nitrite positive urine.  He reports drinking 1 bottle of water  prior to appointment today.  May be slightly dehydrated.  Will treat for UTI with Cipro  twice daily for 7 days.  Per treatment parameters, patient is appropriate for treatment today.  Strongly encouraged aggressive increase in fluid intake following treatment.  Hypotension Patient's blood pressure low today.  He is asymptomatic.  This may be due to dehydration in combination with UTI.  Encouraged aggressive hydration following treatment today.  Treat UTI with Cipro  500 mg twice daily.  Advised he monitor his blood pressure more frequently.  May need to discuss medication dosing with his cardiologist if blood pressure continues to be low.  Plan Labs reviewed. - Unremarkable CBC - CBC showing mild elevation of BUN and creatinine over baseline.  Aggressive hydration recommended. - Urine positive for nitrites.  Treat with Cipro  500 mg  twice daily for 7 days. Patient labs and presentation are reviewed for treatment today.   - Proceed with cycle 4 day 1 intravesical treatment with gemcitabine  and docetaxel  Follow-up appointment with Dr. Twylla on 07/30/2024. Plan for labs, follow-up with Dr. Tina, and next treatment on 08/16/2024 as planned.   The patient understands the plans discussed today and is in agreement with them.  He knows to contact our office if he develops concerns prior to his next appointment.  I provided 30 minutes of face-to-face time during this encounter and > 50% was spent counseling as documented under my assessment and plan.    Powell FORBES Lessen, NP   CANCER CENTER [facility] - A DEPT OF [facility] [address] [place] [address] Dept: [phone] Dept Fax: [phone]   No orders of the defined types were placed in this encounter.     CHIEF COMPLAINT:  CC: urothelial carcinoma of the bladder   Current Treatment:  maintenance intravesical gemcitabine  with docetaxol  INTERVAL HISTORY:  [name] is here today for repeat clinical assessment. He was last seen by Dr. Tina on 06/21/2024.  Reports feeling fatigued, but otherwise doing well.  He denies chest pain, chest pressure, or unusual shortness of breath. He denies headaches or visual disturbances. He denies abdominal pain, nausea, vomiting, or changes in bowel or bladder habits. States that he does get some dysuria and frequency for a day or two after intravesical chemotherapy.  Otherwise, denies dysuria or urinary urgency. He denies fevers or chills. He denies pain. His appetite is good. His weight has been stable.  I have reviewed the  past medical history, past surgical history, social history and family history with the patient and they are unchanged from previous note.  ALLERGIES:  is allergic to vioxx [rofecoxib], oxycodone , spiriva  respimat [tiotropium bromide], contrast media  [iodinated contrast media], and morphine .  MEDICATIONS:  Current Outpatient Medications  Medication Sig Dispense Refill   albuterol  (PROVENTIL ) (2.5 MG/3ML) 0.083% nebulizer solution TAKE 3 MLS BY NEBULIZATION EVERY 6 HOURSAS NEEDED FOR WHEEZING OR SHORTNESS OF BREATH 75 mL 12   albuterol  (VENTOLIN  HFA) 108 (90 Base) MCG/ACT inhaler TAKE 2 PUFFS INTO LUNGS EVERY 6 HOURS ASNEEDED FOR WHEEZING OR SHORTNESS OF BREATH 8.5 each 3   aspirin  EC 81 MG EC tablet Take 1 tablet (81 mg total) by mouth daily. 30 tablet 0   atorvastatin  (LIPITOR) 20 MG tablet Take 1 tablet (20 mg total) by mouth daily. 90 tablet 3   ciprofloxacin  (CIPRO ) 500 MG tablet Take 1 tablet (500 mg total) by mouth 2 (two) times daily. 14 tablet 0   Cyanocobalamin  (VITAMIN B-12 ER PO) Take 1 capsule by mouth daily.     docusate sodium  (COLACE) 100 MG capsule Take 1 capsule (100 mg total) by mouth daily as needed for mild constipation.     ezetimibe  (ZETIA ) 10 MG tablet TAKE 1 TABLET BY MOUTH ONCE DAILY 90 tablet 3   famotidine  (PEPCID ) 20 MG tablet Take 20 mg by mouth daily.     fluorouracil  (EFUDEX ) 5 % cream Apply to nose twice a day x 7 days 30 g 2   fluticasone  (FLONASE ) 50 MCG/ACT nasal spray Place 1 spray into both nostrils daily as needed for allergies or rhinitis.     Fluticasone -Umeclidin-Vilant (TRELEGY ELLIPTA ) 200-62.5-25 MCG/ACT AEPB Inhale 1 puff into the lungs daily. 180 each 3   FLUZONE HIGH-DOSE 0.5 ML injection      furosemide  (LASIX ) 40 MG tablet Take 0.5 tablets (20 mg total) by mouth daily as needed. Take 1 tablet (40 mg) daily as need for weight greater then 208. 180 tablet 3   gabapentin  (NEURONTIN ) 600 MG tablet Take 0.5 tablets (300 mg total) by mouth 2 (two) times daily AND 1 tablet (600 mg total) at bedtime. 180 tablet 3   HYDROcodone -acetaminophen  (NORCO/VICODIN) 5-325 MG tablet Take 1-2 tablets by mouth every 6 (six) hours as needed.     hydrocortisone  2.5 % cream Apply to chin and beside the nose at  bedtime Tues, Thurs, Sat 30 g 5   isosorbide  mononitrate (IMDUR ) 30 MG 24 hr tablet TAKE 1 TABLET BY MOUTH DAILY. 90 tablet 3   ketoconazole  (NIZORAL ) 2 % cream Apply to chin and beside the nose at bedtime Mon, Wed, Fri 15 g 0   losartan  (COZAAR ) 25 MG tablet Take 1 tablet (25 mg total) by mouth daily. 90 tablet 3   metoprolol  succinate (TOPROL  XL) 25 MG 24 hr tablet Take 0.5 tablets (12.5 mg total) by mouth daily. 45 tablet 3   montelukast  (SINGULAIR ) 10 MG tablet TAKE 1 TABLET BY MOUTH DAILY 30 tablet 11   mupirocin  ointment (BACTROBAN ) 2 % Apply 1 Application topically 2 (two) times daily. 22 g 0   nitroGLYCERIN  (NITROSTAT ) 0.4 MG SL tablet Place 1 tablet (0.4 mg total) under the tongue every 5 (five) minutes as needed for chest pain. 25 tablet 3   ondansetron  (ZOFRAN ) 8 MG tablet Take 1 tablet (8 mg total) by mouth every 8 (eight) hours as needed for nausea or vomiting. 30 tablet 1   phenazopyridine  (PYRIDIUM ) 200 MG tablet Take 1 tablet (  200 mg total) by mouth 3 (three) times daily as needed for pain. One on day before treatment, then as needed 30 tablet 1   polyethylene glycol (MIRALAX  / GLYCOLAX ) 17 g packet Take 17 g by mouth daily as needed for mild constipation.      prochlorperazine  (COMPAZINE ) 10 MG tablet Take 1 tablet (10 mg total) by mouth every 6 (six) hours as needed for nausea or vomiting. 30 tablet 1   ranolazine  (RANEXA ) 500 MG 12 hr tablet TAKE ONE (1) TABLET BY MOUTH TWO TIMES PER DAY 180 tablet 3   sodium bicarbonate  650 MG tablet Take 2 tabs (1300 mg) of sodium bicarbonate  (two 650 mg tabs) the night before and morning of therapy 28 tablet 0   spironolactone  (ALDACTONE ) 25 MG tablet TAKE ONE-HALF (1/2) TABLET (12.5 MG) BY MOUTH ONCE DAILY 45 tablet 3   tamsulosin  (FLOMAX ) 0.4 MG CAPS capsule TAKE 1 CAPSULE BY MOUTH AT BEDTIME 90 capsule 1   Vibegron  (GEMTESA ) 75 MG TABS Take 1 tablet (75 mg total) by mouth daily. 30 tablet 11   No current facility-administered medications  for this visit.   Facility-Administered Medications Ordered in Other Visits  Medication Dose Route Frequency Provider Last Rate Last Admin   DOCEtaxel  (TAXOTERE ) 37.5 mg in sodium chloride  (PF) 0.9 % 50 mL chemo bladder instillation  37.5 mg Bladder Instillation Once Tina Pauletta BROCKS, MD        HISTORY OF PRESENT ILLNESS:   Oncology History  Urothelial carcinoma of bladder (HCC)  10/19/2021 Pathology Results   DIAGNOSIS:  A. URINARY BLADDER TUMOR; TRANSURETHRAL RESECTION:  - NONINVASIVE PAPILLARY UROTHELIAL CARCINOMA, HIGH-GRADE (WHO/ISUP).  - MUSCULARIS PROPRIA PRESENT AND UNINVOLVED.   B. URINARY BLADDER, TUMOR BASE; TRANSURETHRAL RESECTION:  - BENIGN MUSCULARIS PROPRIA.  - NO DEFINITE INTACT UROTHELIUM IDENTIFIED.  - NEGATIVE FOR MALIGNANCY.   C. URINARY BLADDER, LEFT POSTERIOR WALL; TRANSURETHRAL RESECTION:  - NONINVASIVE PAPILLARY UROTHELIAL CARCINOMA, LOW-GRADE (WHO/ISUP).  - NO MUSCULARIS PROPRIA IDENTIFIED.    11/12/2021 Initial Diagnosis   Urothelial carcinoma of bladder (HCC)   11/15/2022 Pathology Results   IAGNOSIS:  A. URINARY BLADDER, RIGHT LATERAL WALL; TRANSURETHRAL RESECTION:  - NONINVASIVE PAPILLARY UROTHELIAL CARCINOMA, HIGH-GRADE (WHO/ISUP).  - NO MUSCULARIS PROPRIA IDENTIFIED.   B. URINARY BLADDER TUMOR BASE, RIGHT LATERAL WALL; TRANSURETHRAL BIOPSY:  - PARTIALLY DENUDED, BENIGN APPEARING UROTHELIAL TISSUE.  - MUSCULARIS PROPRIA PRESENT AND UNINVOLVED.   C. URINARY BLADDER, LEFT LATERAL WALL; TRANSURETHRAL RESECTION:  - NONINVASIVE PAPILLARY UROTHELIAL CARCINOMA, LOW-GRADE (WHO/ISUP).  - NO MUSCULARIS PROPRIA IDENTIFIED.    01/16/2024 Pathology Results   1. 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,    03/15/2024 -  Chemotherapy   Patient is on Treatment Plan : BLADDER Gemcitabine  (1000), Docetaxel  (37.5) INTRAVESICAL q7d     03/15/2024 Cancer Staging   Staging form: Urinary Bladder, AJCC 8th  Edition - Clinical: Stage 0is (cTis, cN0, cM0) - Signed by Tina Pauletta BROCKS, MD on 03/15/2024 WHO/ISUP grade (low/high): High Grade Histologic grading system: 2 grade system       REVIEW OF SYSTEMS:   Constitutional: Denies fevers, chills or abnormal weight loss.  Fatigue. Eyes: Denies blurriness of vision Ears, nose, mouth, throat, and face: Denies mucositis or sore throat Respiratory: Denies cough, dyspnea or wheezes Cardiovascular: Denies palpitation, chest discomfort or lower extremity swelling Gastrointestinal:  Denies nausea, heartburn or change in bowel habits Skin: Denies abnormal skin rashes Lymphatics: Denies new lymphadenopathy or easy bruising Neurological:Denies  numbness, tingling or new weaknesses Behavioral/Psych: Mood is stable, no new changes  All other systems were reviewed with the patient and are negative.   VITALS:   Today's Vitals   07/19/24 1036 07/19/24 1100 07/19/24 1101  BP: (!) 88/45 (!) 88/42 (!) 99/44  Pulse: 78    Resp: 16    Temp: (!) 97.5 F (36.4 C)    TempSrc: Oral    SpO2: 95%    Weight: 215 lb 12.8 oz (97.9 kg)    PainSc: 3      Body mass index is 29.27 kg/m.   Wt Readings from Last 3 Encounters:  07/19/24 215 lb 12.8 oz (97.9 kg)  06/21/24 217 lb 4 oz (98.5 kg)  05/17/24 214 lb 11.2 oz (97.4 kg)    Body mass index is 29.27 kg/m.  Performance status (ECOG): 2 - Symptomatic, <50% confined to bed  PHYSICAL EXAM:   GENERAL:alert, no distress and comfortable SKIN: skin color, texture, turgor are normal, no rashes or significant lesions EYES: normal, Conjunctiva are pink and non-injected, sclera clear OROPHARYNX:no exudate, no erythema and lips, buccal mucosa, and tongue normal  NECK: supple, thyroid  normal size, non-tender, without nodularity LYMPH:  no palpable lymphadenopathy in the cervical, axillary or inguinal LUNGS: clear to auscultation and percussion with normal breathing effort HEART: regular rate & rhythm and no murmurs  and no lower extremity edema ABDOMEN:abdomen soft, non-tender and normal bowel sounds Musculoskeletal:no cyanosis of digits and no clubbing  NEURO: alert & oriented x 3 with fluent speech, no focal motor/sensory deficits  LABORATORY DATA:  I have reviewed the data as listed    Component Value Date/Time   NA 139 07/19/2024 1019   NA 139 11/03/2023 1104   NA 138 08/10/2014 0559   K 4.4 07/19/2024 1019   K 4.2 08/10/2014 0559   CL 103 07/19/2024 1019   CL 104 08/10/2014 0559   CO2 25 07/19/2024 1019   CO2 25 08/10/2014 0559   GLUCOSE 155 (H) 07/19/2024 1019   GLUCOSE 132 (H) 08/10/2014 0559   BUN 33 (H) 07/19/2024 1019   BUN 30 (H) 11/03/2023 1104   BUN 25 (H) 08/10/2014 0559   CREATININE 1.83 (H) 07/19/2024 1019   CREATININE 1.26 08/10/2014 0559   CALCIUM  9.0 07/19/2024 1019   CALCIUM  8.5 08/10/2014 0559   PROT 6.5 07/19/2024 1019   PROT 6.4 09/10/2019 1123   PROT 6.5 05/14/2013 2204   ALBUMIN  4.3 07/19/2024 1019   ALBUMIN  4.3 09/10/2019 1123   ALBUMIN  3.6 05/14/2013 2204   AST 21 07/19/2024 1019   ALT 11 07/19/2024 1019   ALT 20 05/14/2013 2204   ALKPHOS 41 07/19/2024 1019   ALKPHOS 48 (L) 05/14/2013 2204   BILITOT 0.5 07/19/2024 1019   GFRNONAA 36 (L) 07/19/2024 1019   GFRNONAA 60 (L) 08/10/2014 0559   GFRNONAA >60 07/03/2013 1505   GFRAA 57 (L) 12/18/2019 1113   GFRAA >60 08/10/2014 0559   GFRAA >60 07/03/2013 1505     Lab Results  Component Value Date   WBC 6.1 07/19/2024   NEUTROABS 4.2 07/19/2024   HGB 13.1 07/19/2024   HCT 38.6 (L) 07/19/2024   MCV 95.8 07/19/2024   PLT 188 07/19/2024

## 2024-07-18 NOTE — Assessment & Plan Note (Addendum)
 C4 maintenance gem/doce Sodium bicarb 1300 mg the night before and the morning of treatment. Refilled today Pyridium  200 mg as needed 3 times a day on day 1 and 2 if having urinary pain Fridays works well. Follow up with Dr. Twylla on 12/16 as planned for cystoscopy Return in January for labs, follow-up with Dr. Twylla, and next  intravesical chemotherapy.

## 2024-07-19 ENCOUNTER — Encounter: Payer: Self-pay | Admitting: Nurse Practitioner

## 2024-07-19 ENCOUNTER — Inpatient Hospital Stay

## 2024-07-19 ENCOUNTER — Inpatient Hospital Stay: Admitting: Nurse Practitioner

## 2024-07-19 VITALS — BP 99/44 | HR 78 | Temp 97.5°F | Resp 16 | Wt 215.8 lb

## 2024-07-19 VITALS — BP 138/56 | HR 70 | Temp 97.5°F | Resp 20

## 2024-07-19 DIAGNOSIS — C679 Malignant neoplasm of bladder, unspecified: Secondary | ICD-10-CM | POA: Diagnosis not present

## 2024-07-19 DIAGNOSIS — Z5111 Encounter for antineoplastic chemotherapy: Secondary | ICD-10-CM | POA: Diagnosis present

## 2024-07-19 DIAGNOSIS — Z79899 Other long term (current) drug therapy: Secondary | ICD-10-CM | POA: Insufficient documentation

## 2024-07-19 DIAGNOSIS — N189 Chronic kidney disease, unspecified: Secondary | ICD-10-CM | POA: Insufficient documentation

## 2024-07-19 DIAGNOSIS — Z7982 Long term (current) use of aspirin: Secondary | ICD-10-CM | POA: Diagnosis not present

## 2024-07-19 LAB — CBC WITH DIFFERENTIAL (CANCER CENTER ONLY)
Abs Immature Granulocytes: 0.02 K/uL (ref 0.00–0.07)
Basophils Absolute: 0.1 K/uL (ref 0.0–0.1)
Basophils Relative: 1 %
Eosinophils Absolute: 0.3 K/uL (ref 0.0–0.5)
Eosinophils Relative: 4 %
HCT: 38.6 % — ABNORMAL LOW (ref 39.0–52.0)
Hemoglobin: 13.1 g/dL (ref 13.0–17.0)
Immature Granulocytes: 0 %
Lymphocytes Relative: 16 %
Lymphs Abs: 1 K/uL (ref 0.7–4.0)
MCH: 32.5 pg (ref 26.0–34.0)
MCHC: 33.9 g/dL (ref 30.0–36.0)
MCV: 95.8 fL (ref 80.0–100.0)
Monocytes Absolute: 0.5 K/uL (ref 0.1–1.0)
Monocytes Relative: 8 %
Neutro Abs: 4.2 K/uL (ref 1.7–7.7)
Neutrophils Relative %: 71 %
Platelet Count: 188 K/uL (ref 150–400)
RBC: 4.03 MIL/uL — ABNORMAL LOW (ref 4.22–5.81)
RDW: 12.1 % (ref 11.5–15.5)
WBC Count: 6.1 K/uL (ref 4.0–10.5)
nRBC: 0 % (ref 0.0–0.2)

## 2024-07-19 LAB — URINALYSIS, COMPLETE (UACMP) WITH MICROSCOPIC
Bilirubin Urine: NEGATIVE
Glucose, UA: NEGATIVE mg/dL
Hgb urine dipstick: NEGATIVE
Ketones, ur: NEGATIVE mg/dL
Leukocytes,Ua: NEGATIVE
Nitrite: POSITIVE — AB
Protein, ur: NEGATIVE mg/dL
Specific Gravity, Urine: 1.016 (ref 1.005–1.030)
pH: 5 (ref 5.0–8.0)

## 2024-07-19 LAB — CMP (CANCER CENTER ONLY)
ALT: 11 U/L (ref 0–44)
AST: 21 U/L (ref 15–41)
Albumin: 4.3 g/dL (ref 3.5–5.0)
Alkaline Phosphatase: 41 U/L (ref 38–126)
Anion gap: 11 (ref 5–15)
BUN: 33 mg/dL — ABNORMAL HIGH (ref 8–23)
CO2: 25 mmol/L (ref 22–32)
Calcium: 9 mg/dL (ref 8.9–10.3)
Chloride: 103 mmol/L (ref 98–111)
Creatinine: 1.83 mg/dL — ABNORMAL HIGH (ref 0.61–1.24)
GFR, Estimated: 36 mL/min — ABNORMAL LOW (ref >60)
Glucose, Bld: 155 mg/dL — ABNORMAL HIGH (ref 70–99)
Potassium: 4.4 mmol/L (ref 3.5–5.1)
Sodium: 139 mmol/L (ref 135–145)
Total Bilirubin: 0.5 mg/dL (ref 0.0–1.2)
Total Protein: 6.5 g/dL (ref 6.5–8.1)

## 2024-07-19 MED ORDER — SODIUM CHLORIDE (PF) 0.9 % IJ SOLN
37.5000 mg | Freq: Once | INTRAVENOUS | Status: AC
  Administered 2024-07-19: 37.5 mg via INTRAVESICAL
  Filled 2024-07-19: qty 3.75

## 2024-07-19 MED ORDER — OXYBUTYNIN CHLORIDE 5 MG PO TABS
5.0000 mg | ORAL_TABLET | Freq: Once | ORAL | Status: AC
  Administered 2024-07-19: 5 mg via ORAL
  Filled 2024-07-19: qty 1

## 2024-07-19 MED ORDER — PROCHLORPERAZINE MALEATE 10 MG PO TABS
10.0000 mg | ORAL_TABLET | Freq: Once | ORAL | Status: AC
  Administered 2024-07-19: 10 mg via ORAL
  Filled 2024-07-19: qty 1

## 2024-07-19 MED ORDER — SODIUM CHLORIDE (PF) 0.9 % IJ SOLN
1000.0000 mg | Freq: Once | INTRAVENOUS | Status: AC
  Administered 2024-07-19: 1000 mg via INTRAVESICAL
  Filled 2024-07-19: qty 26.3

## 2024-07-19 MED ORDER — LIDOCAINE HCL URETHRAL/MUCOSAL 2 % EX GEL
1.0000 | Freq: Once | CUTANEOUS | Status: AC
  Administered 2024-07-19: 1 via URETHRAL
  Filled 2024-07-19: qty 1

## 2024-07-19 MED ORDER — CIPROFLOXACIN HCL 500 MG PO TABS: 500.0000 mg | ORAL_TABLET | Freq: Two times a day (BID) | ORAL | 0 refills | Status: DC

## 2024-07-24 ENCOUNTER — Ambulatory Visit: Admitting: Pulmonary Disease

## 2024-07-25 ENCOUNTER — Ambulatory Visit: Admitting: Pulmonary Disease

## 2024-07-25 ENCOUNTER — Ambulatory Visit: Admitting: Podiatry

## 2024-07-25 ENCOUNTER — Encounter: Payer: Self-pay | Admitting: Pulmonary Disease

## 2024-07-25 ENCOUNTER — Ambulatory Visit

## 2024-07-25 VITALS — BP 110/62 | HR 64 | Temp 98.0°F | Ht 72.0 in | Wt 214.0 lb

## 2024-07-25 VITALS — Ht 72.0 in | Wt 214.0 lb

## 2024-07-25 DIAGNOSIS — J209 Acute bronchitis, unspecified: Secondary | ICD-10-CM

## 2024-07-25 DIAGNOSIS — M79672 Pain in left foot: Secondary | ICD-10-CM | POA: Diagnosis not present

## 2024-07-25 DIAGNOSIS — J4541 Moderate persistent asthma with (acute) exacerbation: Secondary | ICD-10-CM

## 2024-07-25 DIAGNOSIS — L600 Ingrowing nail: Secondary | ICD-10-CM

## 2024-07-25 DIAGNOSIS — G4733 Obstructive sleep apnea (adult) (pediatric): Secondary | ICD-10-CM

## 2024-07-25 DIAGNOSIS — Z87891 Personal history of nicotine dependence: Secondary | ICD-10-CM | POA: Diagnosis not present

## 2024-07-25 DIAGNOSIS — I429 Cardiomyopathy, unspecified: Secondary | ICD-10-CM

## 2024-07-25 DIAGNOSIS — J45901 Unspecified asthma with (acute) exacerbation: Secondary | ICD-10-CM | POA: Diagnosis not present

## 2024-07-25 DIAGNOSIS — R0602 Shortness of breath: Secondary | ICD-10-CM

## 2024-07-25 LAB — NITRIC OXIDE: Nitric Oxide: 49

## 2024-07-25 MED ORDER — AZITHROMYCIN 250 MG PO TABS: ORAL_TABLET | ORAL | 0 refills | Status: DC

## 2024-07-25 MED ORDER — PREDNISONE 20 MG PO TABS: 20.0000 mg | ORAL_TABLET | Freq: Every day | ORAL | 0 refills | Status: AC

## 2024-07-25 NOTE — Patient Instructions (Signed)
 VISIT SUMMARY:  Today, you came in for a follow-up visit to address your asthma and asthmatic bronchitis. You reported increased airway discomfort, pain when breathing, and coughing up sputum. We discussed your concerns about smoke exposure, blood sugar levels, balance issues, and neuropathy.  YOUR PLAN:  -ASTHMA WITH ACUTE BRONCHITIS EXACERBATION: You are experiencing a flare-up of your asthma and bronchitis, likely due to weather changes, which has caused increased inflammation and airway congestion. To manage this, you have been prescribed azithromycin  (Z-Pak) to treat the infection and a 5-day course of prednisone  to reduce inflammation. Avoiding smoke exposure is crucial as it worsens your symptoms.  INSTRUCTIONS:  Please take the azithromycin  and prednisone  as prescribed. Avoid smoke exposure as much as possible. We have scheduled a follow-up appointment in two months to monitor your progress. If you have any concerns or your symptoms worsen, please contact our office.

## 2024-07-25 NOTE — Progress Notes (Unsigned)
 Subjective:    Patient ID: [name], male    DOB: [birth year], 83 y.o.   MRN: [id number]  Patient Care Team: Rilla Baller, MD as PCP - General (Family Medicine) Perla Evalene PARAS, MD as Consulting Physician (Cardiology) Tamea Dedra LITTIE, MD as Consulting Physician (Pulmonary Disease) Darron Deatrice LABOR, MD as Consulting Physician (Cardiology) Pa, [facility] (Ophthalmology) Tina Pauletta BROCKS, MD as Consulting Physician (Oncology) Twylla Glendia BROCKS, MD (Urology)  Chief Complaint  Patient presents with   COPD    Cough with phlegm. Chest congestion.     BACKGROUND/INTERVAL: [name] is an 83 year old former smoker (25 PY) who presents for an acute visit in the setting of COVID-19 infection on 14 October 2023.  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 Patient was last seen on 24 October 2023.  Is on Dupixent  having started on 29 August 2023.   HPI   Review of Systems A 10 point review of systems was performed and it is as noted above otherwise negative.   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   OSA (obstructive sleep apnea)    Chronic heart failure with preserved ejection fraction (HFpEF) (HCC)    Nocturnal hypoxia 06/11/2021   CKD (chronic kidney disease) stage 3, GFR 30-59 ml/min (HCC) 02/24/2021   Peripheral neuropathy 02/23/2021   Normocytic anemia 02/13/2021   Spondylolisthesis of lumbar region 07/07/2020    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   Coccydynia 09/20/2019   Nasal sinus congestion 07/19/2019   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   Anxiety 06/04/2019   Chronic right shoulder pain 09/10/2018   Cervical stenosis of spinal canal 06/05/2017   Peripheral edema 06/05/2017   Encounter for general adult medical examination with abnormal findings 12/06/2016   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/13/2016   Overweight (BMI 25.0-29.9) 07/04/2016   Low vitamin B12 level 01/01/2016   Exertional dyspnea 07/07/2015   Unstable angina (HCC)    Medicare annual wellness visit, subsequent 07/03/2015   Advanced care planning/counseling discussion 07/03/2015   Type 2 diabetes mellitus with other specified complication (HCC) 05/04/2015   Pleuritic chest pain 05/04/2015   Ex-smoker 05/04/2015   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   Syncopal vertigo 11/04/2010   Lumbar disc disease with radiculopathy 11/04/2010   Serous otitis media 06/07/2010   Coronary artery disease involving native coronary artery of native heart with unstable angina pectoris (HCC) 08/11/2009   PAD (peripheral artery disease) 08/11/2009   DUPUYTREN'S CONTRACTURE, RIGHT 10/29/2008   Carotid stenosis 07/09/2008   Chronic prostatitis 05/09/2008   Benign prostatic hyperplasia with urinary obstruction 09/06/2007   Essential hypertension 04/30/2007   GERD 04/30/2007   Hyperlipidemia associated with type 2 diabetes mellitus (HCC) 04/26/2007   OA (osteoarthritis) of knee 04/26/2007   DVT, HX OF 04/26/2007    Social History   Tobacco Use   Smoking status: Former    Current packs/day: 0.00    Average packs/day: 1 pack/day for 25.0 years (25.0 ttl pk-yrs)    Types: Cigarettes  Start date: 08/16/1963    Quit date: 08/15/1988    Years since quitting: 35.9    Passive exposure: Past   Smokeless tobacco: Never  Substance Use Topics   Alcohol use: No    Alcohol/week: 0.0 standard drinks of  alcohol    Allergies[1]  Active Medications[2]  Immunization History  Administered Date(s) Administered   Fluad Quad(high Dose 65+) 04/17/2019, 04/15/2022, 05/08/2023   INFLUENZA, HIGH DOSE SEASONAL PF 05/15/2013, 06/25/2015, 07/05/2021, 05/08/2023   Influenza Whole 08/15/2000, 07/04/2007, 05/09/2008, 05/31/2010   Influenza,inj,Quad PF,6+ Mos 04/17/2014, 05/18/2016, 06/07/2018   Influenza-Unspecified 05/22/2017, 05/28/2020, 08/05/2022   PFIZER(Purple Top)SARS-COV-2 Vaccination 09/07/2019, 09/28/2019, 05/12/2020, 01/19/2021   PNEUMOCOCCAL CONJUGATE-20 09/09/2023   Pneumococcal Conjugate-13 02/09/2015   Pneumococcal Polysaccharide-23 08/15/2000, 10/29/2008   Respiratory Syncytial Virus Vaccine,Recomb Aduvanted(Arexvy) 10/10/2022   Td 08/15/2005   Tdap 02/10/2018   Zoster Recombinant(Shingrix) 04/05/2017, 07/25/2017   Zoster, Live 02/04/2009        Objective:     Vitals:   07/25/24 0941  BP: 110/62  Pulse: 64  Temp: 98 F (36.7 C)  Height: 6' (1.829 m)  Weight: 214 lb (97.1 kg)  SpO2: 96%  TempSrc: Temporal  BMI (Calculated): 29.02     SpO2: 96 %  GENERAL: HEAD: Normocephalic, atraumatic.  EYES: Pupils equal, round, reactive to light.  No scleral icterus.  MOUTH:  NECK: Supple. No thyromegaly. Trachea midline. No JVD.  No adenopathy. PULMONARY: Good air entry bilaterally.  No adventitious sounds. CARDIOVASCULAR: S1 and S2. Regular rate and rhythm.  ABDOMEN: MUSCULOSKELETAL: No joint deformity, no clubbing, no edema.  NEUROLOGIC:  SKIN: Intact,warm,dry. PSYCH:  Lab Results  Component Value Date   NITRICOXIDE 49 07/25/2024  This result suggests intermediate (25-49) Type 2 (T2) airway inflammation; clinical correlation required. *Trend: 52>>41>>32 >> 49 ppb      Assessment & Plan:   No diagnosis found.  No orders of the defined types were placed in this encounter.   No orders of the defined types were placed in this encounter.     Advised if  symptoms do not improve or worsen, to please contact office for sooner follow up or seek emergency care.    I spent xxx minutes of dedicated to the care of this patient on the date of this encounter to include pre-visit review of records, face-to-face time with the patient discussing conditions above, post visit ordering of testing, clinical documentation with the electronic health record, making appropriate referrals as documented, and communicating necessary findings to members of the patients care team.     C. Leita Sanders, MD Advanced Bronchoscopy PCCM [facility]    *This note was generated using voice recognition software/Dragon and/or AI transcription program.  Despite best efforts to proofread, errors can occur which can change the meaning. Any transcriptional errors that result from this process are unintentional and may not be fully corrected at the time of dictation.    [1]  Allergies Allergen Reactions   Vioxx [Rofecoxib] Other (See Comments)    Hemorrhage    Oxycodone  Other (See Comments)    Could not wake up , felt drunk   Spiriva  Respimat [Tiotropium Bromide] Other (See Comments)    Elevated bp   Contrast Media [Iodinated Contrast Media] Itching and Rash    Delayed reaction post abdominal aortagram.    Morphine  Nausea Only and Other (See Comments)    Irritability  morphine   [2]  Current Meds  Medication Sig   albuterol  (PROVENTIL ) (2.5 MG/3ML) 0.083% nebulizer solution TAKE 3 MLS BY  NEBULIZATION EVERY 6 HOURSAS NEEDED FOR WHEEZING OR SHORTNESS OF BREATH   albuterol  (VENTOLIN  HFA) 108 (90 Base) MCG/ACT inhaler TAKE 2 PUFFS INTO LUNGS EVERY 6 HOURS ASNEEDED FOR WHEEZING OR SHORTNESS OF BREATH   aspirin  EC 81 MG EC tablet Take 1 tablet (81 mg total) by mouth daily.   atorvastatin  (LIPITOR) 20 MG tablet Take 1 tablet (20 mg total) by mouth daily.   ciprofloxacin  (CIPRO ) 500 MG tablet Take 1 tablet (500 mg total) by mouth 2 (two) times daily.    Cyanocobalamin  (VITAMIN B-12 ER PO) Take 1 capsule by mouth daily.   docusate sodium  (COLACE) 100 MG capsule Take 1 capsule (100 mg total) by mouth daily as needed for mild constipation.   ezetimibe  (ZETIA ) 10 MG tablet TAKE 1 TABLET BY MOUTH ONCE DAILY   famotidine  (PEPCID ) 20 MG tablet Take 20 mg by mouth daily.   fluorouracil  (EFUDEX ) 5 % cream Apply to nose twice a day x 7 days   fluticasone  (FLONASE ) 50 MCG/ACT nasal spray Place 1 spray into both nostrils daily as needed for allergies or rhinitis.   Fluticasone -Umeclidin-Vilant (TRELEGY ELLIPTA ) 200-62.5-25 MCG/ACT AEPB Inhale 1 puff into the lungs daily.   furosemide  (LASIX ) 40 MG tablet Take 0.5 tablets (20 mg total) by mouth daily as needed. Take 1 tablet (40 mg) daily as need for weight greater then 208.   gabapentin  (NEURONTIN ) 600 MG tablet Take 0.5 tablets (300 mg total) by mouth 2 (two) times daily AND 1 tablet (600 mg total) at bedtime.   HYDROcodone -acetaminophen  (NORCO/VICODIN) 5-325 MG tablet Take 1-2 tablets by mouth every 6 (six) hours as needed.   hydrocortisone  2.5 % cream Apply to chin and beside the nose at bedtime Tues, Thurs, Sat   isosorbide  mononitrate (IMDUR ) 30 MG 24 hr tablet TAKE 1 TABLET BY MOUTH DAILY.   ketoconazole  (NIZORAL ) 2 % cream Apply to chin and beside the nose at bedtime Mon, Wed, Fri   losartan  (COZAAR ) 25 MG tablet Take 1 tablet (25 mg total) by mouth daily.   metoprolol  succinate (TOPROL  XL) 25 MG 24 hr tablet Take 0.5 tablets (12.5 mg total) by mouth daily.   montelukast  (SINGULAIR ) 10 MG tablet TAKE 1 TABLET BY MOUTH DAILY   mupirocin  ointment (BACTROBAN ) 2 % Apply 1 Application topically 2 (two) times daily.   nitroGLYCERIN  (NITROSTAT ) 0.4 MG SL tablet Place 1 tablet (0.4 mg total) under the tongue every 5 (five) minutes as needed for chest pain.   ondansetron  (ZOFRAN ) 8 MG tablet Take 1 tablet (8 mg total) by mouth every 8 (eight) hours as needed for nausea or vomiting.   phenazopyridine  (PYRIDIUM )  200 MG tablet Take 1 tablet (200 mg total) by mouth 3 (three) times daily as needed for pain. One on day before treatment, then as needed   polyethylene glycol (MIRALAX  / GLYCOLAX ) 17 g packet Take 17 g by mouth daily as needed for mild constipation.    prochlorperazine  (COMPAZINE ) 10 MG tablet Take 1 tablet (10 mg total) by mouth every 6 (six) hours as needed for nausea or vomiting.   ranolazine  (RANEXA ) 500 MG 12 hr tablet TAKE ONE (1) TABLET BY MOUTH TWO TIMES PER DAY   sodium bicarbonate  650 MG tablet Take 2 tabs (1300 mg) of sodium bicarbonate  (two 650 mg tabs) the night before and morning of therapy   spironolactone  (ALDACTONE ) 25 MG tablet TAKE ONE-HALF (1/2) TABLET (12.5 MG) BY MOUTH ONCE DAILY   tamsulosin  (FLOMAX ) 0.4 MG CAPS capsule TAKE 1 CAPSULE BY  MOUTH AT BEDTIME   Vibegron  (GEMTESA ) 75 MG TABS Take 1 tablet (75 mg total) by mouth daily.

## 2024-07-28 NOTE — Progress Notes (Unsigned)
°  Subjective:  Patient ID: [name], male    DOB: 05/01/1941,  MRN: [id number]  Chief Complaint  Patient presents with   Toe Pain    RM 6 Patient is her for left 3rd toe pain, possible ingrown. Pt states pain on the lateral border.    83 y.o. male presents with the above complaint. History confirmed with patient.   Objective:  Physical Exam: warm, good capillary refill, no trophic changes or ulcerative lesions, normal DP and PT pulses, normal sensory exam, and left lateral third toenail tender no paronychia, no deep ingrown mild hammertoe contracture  Assessment:   1. Left foot pain      Plan:  Patient was evaluated and treated and all questions answered.  Ingrown nail mild on left third lateral debrided sharply w/ nail nipper to resolve uneventfully and did not require procedure to remove no infection noted,   Return if symptoms worsen or fail to improve.

## 2024-07-30 ENCOUNTER — Ambulatory Visit: Admitting: Urology

## 2024-07-30 ENCOUNTER — Ambulatory Visit: Admitting: Podiatry

## 2024-07-30 VITALS — BP 152/67 | HR 83 | Ht 72.0 in | Wt 210.0 lb

## 2024-07-30 DIAGNOSIS — D494 Neoplasm of unspecified behavior of bladder: Secondary | ICD-10-CM

## 2024-07-30 LAB — MICROSCOPIC EXAMINATION: Bacteria, UA: NONE SEEN

## 2024-07-30 LAB — URINALYSIS, COMPLETE
Bilirubin, UA: NEGATIVE
Glucose, UA: NEGATIVE
Ketones, UA: NEGATIVE
Leukocytes,UA: NEGATIVE
Nitrite, UA: NEGATIVE
Protein,UA: NEGATIVE
RBC, UA: NEGATIVE
Specific Gravity, UA: 1.02 (ref 1.005–1.030)
Urobilinogen, Ur: 0.2 mg/dL (ref 0.2–1.0)
pH, UA: 6 (ref 5.0–7.5)

## 2024-07-30 NOTE — Progress Notes (Signed)
 In and Out Catheterization  Patient is present today for a I & O catheterization due to  Cysto. Patient was cleaned and prepped in a sterile fashion with betadine . A 14FR cath was inserted no complications were noted , of urine return was noted, urine was yellow in color. A clean urine sample was collected for UA. Bladder was drained  And catheter was removed with out difficulty.    Performed by: Laymon Ned, CMA  Follow up/ Additional notes: None

## 2024-07-30 NOTE — Progress Notes (Signed)
" ° °  07/30/2024    CC:  Chief Complaint  Patient presents with   Cysto   Urologic history: 1.  Ta high-grade urothelial carcinoma bladder Incidentally noted on pelvic MRI ordered by orthopedics to have a 15 x 20 mm bladder mass near right UO; cystoscopy with 3 cm papillary tumor TURBT 10/19/2025 with high-grade tumor and a low-grade tumor left posterolateral wall Induction BCG x6 completed 01/20/2022 Maintenance BCG x 3: 05/23/2022 MR urogram 03/2022 showed no upper tract abnormalities TURBT 11/2022 for a recurrent papillary tumor on surveillance; path high-grade Ta urothelial carcinoma Reinduction BCG x 6 completed 02/15/2023 Maintenance BCG x 3 completed: 07/06/2023 TURBT 01/16/2024 recurrent papillary tumors dome and left posterior wall; path high-grade urothelial dysplasia with CIS Saw Dr. Tina, [facility]; 6-week course intravesical gemcitabine /docetaxel  for BCG refractory CA; presently on maintenance gemcitabine /docetaxel    2.  BPH with LUTS Cystoscopy moderate lateral lobe enlargement/bladder neck elevation On tamsulosin   HPI: No complaints since last visit.  Denies dysuria, gross hematuria  Blood pressure (!) 152/67, pulse 83, height 6' (1.829 m), weight 210 lb (95.3 kg). NED. A&Ox3.   No respiratory distress   Abd soft, NT, ND Normal phallus with bilateral descended testicles  Cystoscopy Procedure Note  Patient identification was confirmed, informed consent was obtained, and patient was prepped using Betadine solution.  Lidocaine  jelly was administered per urethral meatus.     Pre-Procedure: - Inspection reveals a normal caliber ureteral meatus.  Procedure: The flexible cystoscope was introduced without difficulty - Caliber bulb stricture - Mild lateral lobe enlargement prostate  - Elevated bladder neck - Bilateral ureteral orifices identified - Bladder mucosa  reveals no ulcers, tumors, or lesions - No bladder stones - Mild/moderate  trabeculation  Retroflexion shows tumor or intravesical median lobe   Post-Procedure: - Patient tolerated the procedure well  Assessment/ Plan: No recurrent tumor or mucosal abnormalities Follow-up surveillance cystoscopy 3 months Continue maintenance gemcitabine /docetaxel  with oncology    Glendia JAYSON Barba, MD "

## 2024-08-07 ENCOUNTER — Telehealth: Payer: Self-pay

## 2024-08-07 NOTE — Telephone Encounter (Signed)
" ° °  Name: [name]  DOB: 1941-05-06  MRN: [id number]  Primary Cardiologist: None   Preoperative team, please contact this patient and set up a phone call appointment for further preoperative risk assessment. Please obtain consent and complete medication review. Thank you for your help.  I confirm that guidance regarding antiplatelet and oral anticoagulation therapy has been completed and, if necessary, noted below.  Regarding ASA therapy, we recommend continuation of ASA throughout the perioperative period. However, if the surgeon feels that cessation of ASA is required in the perioperative period, it may be stopped 5-7 days prior to surgery with a plan to resume it as soon as felt to be feasible from a surgical standpoint in the post-operative period.    I also confirmed the patient resides in the state of [place] . As per [facility] Medical Board telemedicine laws, the patient must reside in the state in which the provider is licensed.   Josefa CHRISTELLA Beauvais, NP 08/07/2024, 10:59 AM [facility]    "

## 2024-08-07 NOTE — Telephone Encounter (Signed)
 Tried contacting patient to schedule TELEVISIT no answer left a detailed vm to call back and schedule

## 2024-08-07 NOTE — Telephone Encounter (Signed)
"  ° °  Pre-operative Risk Assessment    Patient Name: [name]  DOB: [birth year] MRN: [id number]   Date of last office visit: 05/08/24 Date of next office visit:    Request for Surgical Clearance    Procedure:  Right hand carpal tunnel release right elbow ulnar release   Date of Surgery:  Clearance 09/11/24                                 Surgeon:  Dr. Prentice Pagan  Surgeon's Group or Practice Name:  [facility]  Phone number:  [phone] Fax number:  [phone]   Type of Clearance Requested:   - Medical  - Pharmacy:  Hold Aspirin  Not indicated    Type of Anesthesia:  transposition with block and IV sedation    Additional requests/questions:    [name]   08/07/2024, 10:07 AM   "

## 2024-08-09 NOTE — Telephone Encounter (Signed)
 LVM again for patient to call back to schedule televisit

## 2024-08-10 ENCOUNTER — Encounter: Payer: Self-pay | Admitting: Urology

## 2024-08-11 ENCOUNTER — Encounter: Payer: Self-pay | Admitting: Pulmonary Disease

## 2024-08-12 ENCOUNTER — Emergency Department

## 2024-08-12 ENCOUNTER — Emergency Department
Admission: EM | Admit: 2024-08-12 | Discharge: 2024-08-13 | Disposition: A | Discharge status: Home / Self Care | Attending: Emergency Medicine | Admitting: Emergency Medicine

## 2024-08-12 ENCOUNTER — Other Ambulatory Visit: Payer: Self-pay

## 2024-08-12 ENCOUNTER — Telehealth: Payer: Self-pay

## 2024-08-12 DIAGNOSIS — J441 Chronic obstructive pulmonary disease with (acute) exacerbation: Secondary | ICD-10-CM | POA: Diagnosis not present

## 2024-08-12 DIAGNOSIS — I2510 Atherosclerotic heart disease of native coronary artery without angina pectoris: Secondary | ICD-10-CM | POA: Diagnosis not present

## 2024-08-12 DIAGNOSIS — I130 Hypertensive heart and chronic kidney disease with heart failure and stage 1 through stage 4 chronic kidney disease, or unspecified chronic kidney disease: Secondary | ICD-10-CM | POA: Insufficient documentation

## 2024-08-12 DIAGNOSIS — R059 Cough, unspecified: Secondary | ICD-10-CM | POA: Diagnosis present

## 2024-08-12 DIAGNOSIS — J101 Influenza due to other identified influenza virus with other respiratory manifestations: Secondary | ICD-10-CM | POA: Diagnosis not present

## 2024-08-12 DIAGNOSIS — N189 Chronic kidney disease, unspecified: Secondary | ICD-10-CM | POA: Diagnosis not present

## 2024-08-12 DIAGNOSIS — I509 Heart failure, unspecified: Secondary | ICD-10-CM | POA: Diagnosis not present

## 2024-08-12 DIAGNOSIS — R093 Abnormal sputum: Secondary | ICD-10-CM | POA: Diagnosis not present

## 2024-08-12 DIAGNOSIS — E1122 Type 2 diabetes mellitus with diabetic chronic kidney disease: Secondary | ICD-10-CM | POA: Diagnosis not present

## 2024-08-12 MED ORDER — PREDNISONE 20 MG PO TABS
50.0000 mg | ORAL_TABLET | Freq: Once | ORAL | Status: AC
  Administered 2024-08-12: 50 mg via ORAL
  Filled 2024-08-12: qty 1

## 2024-08-12 MED ORDER — DOXYCYCLINE MONOHYDRATE 100 MG PO TABS: 100.0000 mg | ORAL_TABLET | Freq: Two times a day (BID) | ORAL | 0 refills | Status: AC

## 2024-08-12 MED ORDER — IPRATROPIUM-ALBUTEROL 0.5-2.5 (3) MG/3ML IN SOLN
3.0000 mL | Freq: Once | RESPIRATORY_TRACT | Status: AC
  Administered 2024-08-12: 3 mL via RESPIRATORY_TRACT
  Filled 2024-08-12: qty 3

## 2024-08-12 MED ORDER — DOXYCYCLINE HYCLATE 100 MG PO TABS
100.0000 mg | ORAL_TABLET | Freq: Once | ORAL | Status: AC
  Administered 2024-08-12: 100 mg via ORAL
  Filled 2024-08-12: qty 1

## 2024-08-12 MED ORDER — ALBUTEROL SULFATE 0.63 MG/3ML IN NEBU: 1.0000 | INHALATION_SOLUTION | Freq: Four times a day (QID) | RESPIRATORY_TRACT | 2 refills | Status: AC | PRN

## 2024-08-12 MED ORDER — PREDNISONE 50 MG PO TABS: ORAL_TABLET | ORAL | 0 refills | Status: AC

## 2024-08-12 NOTE — Telephone Encounter (Signed)
 He needs to be seen in urgent care or ED so he can be tested for COVID and other potential viruses.  Likely will also need imaging.

## 2024-08-12 NOTE — Telephone Encounter (Signed)
"  °  Patient Consent for Virtual Visit        [name] has provided verbal consent on 08/12/2024 for a virtual visit (video or telephone).   CONSENT FOR VIRTUAL VISIT FOR:  [name]  By participating in this virtual visit I agree to the following:  I hereby voluntarily request, consent and authorize [facility] and its employed or contracted physicians, physician assistants, nurse practitioners or other licensed health care professionals (the Practitioner), to provide me with telemedicine health care services (the Services) as deemed necessary by the treating Practitioner. I acknowledge and consent to receive the Services by the Practitioner via telemedicine. I understand that the telemedicine visit will involve communicating with the Practitioner through live audiovisual communication technology and the disclosure of certain medical information by electronic transmission. I acknowledge that I have been given the opportunity to request an in-person assessment or other available alternative prior to the telemedicine visit and am voluntarily participating in the telemedicine visit.  I understand that I have the right to withhold or withdraw my consent to the use of telemedicine in the course of my care at any time, without affecting my right to future care or treatment, and that the Practitioner or I may terminate the telemedicine visit at any time. I understand that I have the right to inspect all information obtained and/or recorded in the course of the telemedicine visit and may receive copies of available information for a reasonable fee.  I understand that some of the potential risks of receiving the Services via telemedicine include:  Delay or interruption in medical evaluation due to technological equipment failure or disruption; Information transmitted may not be sufficient (e.g. poor resolution of images) to allow for appropriate medical decision making by the  Practitioner; and/or  In rare instances, security protocols could fail, causing a breach of personal health information.  Furthermore, I acknowledge that it is my responsibility to provide information about my medical history, conditions and care that is complete and accurate to the best of my ability. I acknowledge that Practitioner's advice, recommendations, and/or decision may be based on factors not within their control, such as incomplete or inaccurate data provided by me or distortions of diagnostic images or specimens that may result from electronic transmissions. I understand that the practice of medicine is not an exact science and that Practitioner makes no warranties or guarantees regarding treatment outcomes. I acknowledge that a copy of this consent can be made available to me via my patient portal [facility] MyChart), or I can request a printed copy by calling the office of [facility].    I understand that my insurance will be billed for this visit.   I have read or had this consent read to me. I understand the contents of this consent, which adequately explains the benefits and risks of the Services being provided via telemedicine.  I have been provided ample opportunity to ask questions regarding this consent and the Services and have had my questions answered to my satisfaction. I give my informed consent for the services to be provided through the use of telemedicine in my medical care    "

## 2024-08-12 NOTE — ED Triage Notes (Signed)
 Pt reports he has been coughing congested for the past 2 days pt took an at home covid test and tested negative. Pt reports yellow sputum. Denies fevers

## 2024-08-12 NOTE — ED Provider Notes (Signed)
 "  [facility] Provider Note    Event Date/Time   First MD Initiated Contact with Patient 08/12/24 2124     (approximate)   History   Cough   HPI  [name] is a 83 y.o. male  with a past medical history of type 2 diabetes, HLD, hypertension, CAD, PAD, GERD, BPH, history of DVT, lumbar disc disease with radiculopathy, unstable angina, COPD, CKD, OSA, CHF, carotid stenosis presents to the emergency department with productive cough for 2 days with increased yellow and white sputum production.  He also reports some increased work of breathing.  He denies any chest pain, otalgia, sore throat, fever, chills, abdominal pain, vomiting, DOE. Per triage, reports he took an at-home COVID test that was negative. Had recent treatment with azithromycin  and prednisone  for COPD exacerbation 3 weeks ago.   Physical Exam   Triage Vital Signs: ED Triage Vitals  Encounter Vitals Group     BP 08/12/24 1944 (!) 146/56     Girls Systolic BP Percentile --      Girls Diastolic BP Percentile --      Boys Systolic BP Percentile --      Boys Diastolic BP Percentile --      Pulse Rate 08/12/24 1944 78     Resp 08/12/24 1944 15     Temp 08/12/24 1944 97.9 F (36.6 C)     Temp Source 08/12/24 1944 Oral     SpO2 08/12/24 1944 100 %     Weight 08/12/24 1949 210 lb (95.3 kg)     Height 08/12/24 1949 6' (1.829 m)     Head Circumference --      Peak Flow --      Pain Score 08/12/24 1949 3     Pain Loc --      Pain Education --      Exclude from Growth Chart --     Most recent vital signs: Vitals:   08/12/24 1944 08/13/24 0014  BP: (!) 146/56 (!) 146/56  Pulse: 78 78  Resp: 15 15  Temp: 97.9 F (36.6 C)   SpO2: 100% 100%    General: Awake, in no acute distress. Appears stated age. Ears/Nose/Throat: TMs intact b/l. Nares patent, no nasal discharge. Oropharynx moist, no erythema or exudate. Dentition intact. Neck: Supple, no lymphadenopathy, no nuchal  rigidity. CV: Good peripheral perfusion. No leg swelling b/l. Respiratory:Normal respiratory effort.  No respiratory distress. B/l rhonchi on lung exam. GI: Soft, non-distended, non-tender.  Skin:Warm, dry, intact.  Neurological: A&Ox4 to person, place, time, and situation.   ED Results / Procedures / Treatments   Labs (all labs ordered are listed, but only abnormal results are displayed) Labs Reviewed - No data to display   EKG     RADIOLOGY CXR resulted 08/13/2024 at 12:09 a.m. FINDINGS:   LUNGS AND PLEURA: No focal pulmonary opacity. No pleural effusion. No pneumothorax.   HEART AND MEDIASTINUM: CABG markers noted.   BONES AND SOFT TISSUES: Sternotomy wires noted. Right shoulder arthroplasty noted. No acute osseous abnormality.   IMPRESSION: 1. No acute cardiopulmonary process.   PROCEDURES:  Critical Care performed: No   Procedures   MEDICATIONS ORDERED IN ED: Medications  ipratropium-albuterol  (DUONEB) 0.5-2.5 (3) MG/3ML nebulizer solution 3 mL (3 mLs Nebulization Given 08/12/24 2205)  ipratropium-albuterol  (DUONEB) 0.5-2.5 (3) MG/3ML nebulizer solution 3 mL (3 mLs Nebulization Given 08/12/24 2332)  doxycycline  (VIBRA -TABS) tablet 100 mg (100 mg Oral Given 08/12/24 2330)  predniSONE  (DELTASONE ) tablet 50 mg (  50 mg Oral Given 08/12/24 2331)     IMPRESSION / MDM / ASSESSMENT AND PLAN / ED COURSE  I reviewed the triage vital signs and the nursing notes.                              Differential diagnosis includes, but is not limited to, COPD exacerbation, pneumonia, pleural effusion, COVID, flu, RSV  Patient's presentation is most consistent with acute complicated illness / injury requiring diagnostic workup.  Clinical Course as of 08/15/24 1616  Mon Aug 12, 2024  2157 Patient here with productive cough of yellow sputum that started 2 days ago.  He also reports some increased work of breathing.  Denies chest pain, vomiting.  Appears well on exam and  is coughing.  Bilateral rhonchi present on lung exam.  Will give 1 DuoNeb and await chest x-ray results. [SD]  2233 Patient feels breathing is improved after first DuoNeb, but would like second Duoneb while waiting on CXR results. [SD]  2329 CXR still pending.  Patient would not like to wait for results at this time and would like to go home given his wife has to come pick him up.  Patient feels and sounds great after second Duoneb. Will treat for COPD exacerbation with doxycycline , prednisone , albuterol  nebulizer to use at home. He is satting at 100%, lung exam w/ b/l rhonchi but no wheezing. Speaking in full sentences.  Will have him follow-up with his PCP following today's visit.  Told him I will call him if there are any acute findings on the CXR. [SD]    Clinical Course User Index [SD] Sheron Salm, PA-C   The patient may return to the emergency department for any new, worsening, or concerning symptoms. Patient was given the opportunity to ask questions; all questions were answered. Emergency department return precautions were discussed with the patient.  Patient is in agreement to the treatment plan.  Patient is stable for discharge.   FINAL CLINICAL IMPRESSION(S) / ED DIAGNOSES   Final diagnoses:  Increased sputum production  COPD exacerbation (HCC)     Rx / DC Orders   ED Discharge Orders          Ordered    doxycycline  (ADOXA) 100 MG tablet  2 times daily        08/12/24 2320    predniSONE  (DELTASONE ) 50 MG tablet        08/12/24 2320    albuterol  (ACCUNEB ) 0.63 MG/3ML nebulizer solution  Every 6 hours PRN        08/12/24 2320             Note:  This document was prepared using Dragon voice recognition software and may include unintentional dictation errors.     Sheron Salm, PA-C 08/15/24 1616    Claudene Rover, MD 08/17/24 1424  "

## 2024-08-12 NOTE — Discharge Instructions (Addendum)
We believe that your symptoms are caused today by an exacerbation of your COPD.  Please take the prescribed medications and any medications that you have at home for your COPD.  Follow up with your doctor as recommended.  If you develop any new or worsening symptoms, including but not limited to fever, persistent vomiting, worsening shortness of breath, or other symptoms that concern you, please return to the Emergency Department immediately.  

## 2024-08-12 NOTE — Telephone Encounter (Signed)
 Preop tele appt now scheduled, med rec and consent done.

## 2024-08-13 ENCOUNTER — Other Ambulatory Visit: Payer: Self-pay

## 2024-08-13 ENCOUNTER — Ambulatory Visit: Payer: Self-pay | Admitting: Pulmonary Disease

## 2024-08-13 DIAGNOSIS — I2510 Atherosclerotic heart disease of native coronary artery without angina pectoris: Secondary | ICD-10-CM | POA: Insufficient documentation

## 2024-08-13 DIAGNOSIS — N189 Chronic kidney disease, unspecified: Secondary | ICD-10-CM | POA: Insufficient documentation

## 2024-08-13 DIAGNOSIS — E1122 Type 2 diabetes mellitus with diabetic chronic kidney disease: Secondary | ICD-10-CM | POA: Insufficient documentation

## 2024-08-13 DIAGNOSIS — R0789 Other chest pain: Secondary | ICD-10-CM | POA: Insufficient documentation

## 2024-08-13 DIAGNOSIS — J449 Chronic obstructive pulmonary disease, unspecified: Secondary | ICD-10-CM | POA: Insufficient documentation

## 2024-08-13 DIAGNOSIS — J101 Influenza due to other identified influenza virus with other respiratory manifestations: Secondary | ICD-10-CM | POA: Insufficient documentation

## 2024-08-13 DIAGNOSIS — I509 Heart failure, unspecified: Secondary | ICD-10-CM | POA: Insufficient documentation

## 2024-08-13 LAB — RESP PANEL BY RT-PCR (RSV, FLU A&B, COVID)  RVPGX2
Influenza A by PCR: POSITIVE — AB
Influenza B by PCR: NEGATIVE
Resp Syncytial Virus by PCR: NEGATIVE
SARS Coronavirus 2 by RT PCR: NEGATIVE

## 2024-08-13 LAB — BASIC METABOLIC PANEL WITH GFR
Anion gap: 16 — ABNORMAL HIGH (ref 5–15)
BUN: 47 mg/dL — ABNORMAL HIGH (ref 8–23)
CO2: 19 mmol/L — ABNORMAL LOW (ref 22–32)
Calcium: 8.6 mg/dL — ABNORMAL LOW (ref 8.9–10.3)
Chloride: 101 mmol/L (ref 98–111)
Creatinine, Ser: 1.81 mg/dL — ABNORMAL HIGH (ref 0.61–1.24)
GFR, Estimated: 37 mL/min — ABNORMAL LOW
Glucose, Bld: 294 mg/dL — ABNORMAL HIGH (ref 70–99)
Potassium: 4.5 mmol/L (ref 3.5–5.1)
Sodium: 135 mmol/L (ref 135–145)

## 2024-08-13 LAB — CBC
HCT: 39.7 % (ref 39.0–52.0)
Hemoglobin: 13.2 g/dL (ref 13.0–17.0)
MCH: 32.5 pg (ref 26.0–34.0)
MCHC: 33.2 g/dL (ref 30.0–36.0)
MCV: 97.8 fL (ref 80.0–100.0)
Platelets: 171 K/uL (ref 150–400)
RBC: 4.06 MIL/uL — ABNORMAL LOW (ref 4.22–5.81)
RDW: 12.4 % (ref 11.5–15.5)
WBC: 10.1 K/uL (ref 4.0–10.5)
nRBC: 0 % (ref 0.0–0.2)

## 2024-08-13 LAB — TROPONIN T, HIGH SENSITIVITY: Troponin T High Sensitivity: 19 ng/L (ref 0–19)

## 2024-08-13 NOTE — Telephone Encounter (Signed)
 FYI Only or Action Required?: FYI only for provider: appointment scheduled on 12/31.  Patient was last seen in primary care on 03/29/2024 by [name] Baller, MD.  Called Nurse Triage reporting No chief complaint on file..  Symptoms began several weeks ago.  Interventions attempted: Prescription medications: inhaler, nebulizer.  Symptoms are: gradually improving.  Triage Disposition: See Physician Within 24 Hours  Patient/caregiver understands and will follow disposition?: Yes  Copied from CRM #[id number]. Topic: Clinical - Red Word Triage >> Aug 13, 2024  8:28 AM [name] wrote: Red Word that prompted transfer to Nurse Triage: Patient went to ER last night - still expriencing burning his chest but hasnt picked up his medications yet. Reason for Disposition  Increase in amount of sputum    COPD pt seeking ED follow up appt  Answer Assessment - Initial Assessment Questions 1. ONSET: When did the cough begin?      2 weeks, worse 2-3 days  2. SEVERITY: How bad is the cough today?      I hadn't coughed very much today thanks to the inhaler.   3. SPUTUM: Describe the color of your sputum (e.g., none, dry cough; clear, white, yellow, green)     Mostly white, [name] little yellow  4. HEMOPTYSIS: Are you coughing up any blood? If so ask: How much? (e.g., flecks, streaks, tablespoons, etc.)     Denies  5. DIFFICULTY BREATHING: Are you having difficulty breathing? If Yes, ask: How bad is it? (e.g., mild, moderate, severe)      Denies, inhaler and nebulizer are helping  6. FEVER: Do you have [name] fever? If Yes, ask: What is your temperature, how was it measured, and when did it start?     Denies  7. CARDIAC HISTORY: Do you have any history of heart disease? (e.g., heart attack, congestive heart failure)      CAD, CHF  8. LUNG HISTORY: Do you have any history of lung disease?  (e.g., pulmonary embolus, asthma, emphysema)     Asthma, COPD  10. OTHER SYMPTOMS: Do you  have any other symptoms? (e.g., runny nose, wheezing, chest pain) Burning in his chest, was seen in ED for it yesterday  Protocols used: Cough - Chronic-[name]-AH

## 2024-08-13 NOTE — Telephone Encounter (Signed)
 Next Appt With Family Medicine Darra Ring, MD) 08/14/2024 at 9:00 A

## 2024-08-13 NOTE — ED Triage Notes (Signed)
 Pt was seen here last night for same, pt has had cough congestion for past few days pt reports he felt worse tonight and developed chest pain

## 2024-08-13 NOTE — Telephone Encounter (Signed)
 Appreciate Dr Avelina seeing this nice patient.

## 2024-08-14 ENCOUNTER — Other Ambulatory Visit: Payer: Self-pay | Admitting: Pulmonary Disease

## 2024-08-14 ENCOUNTER — Encounter: Payer: Self-pay | Admitting: Family Medicine

## 2024-08-14 ENCOUNTER — Emergency Department
Admission: EM | Admit: 2024-08-14 | Discharge: 2024-08-14 | Disposition: A | Discharge status: Home / Self Care | Attending: Emergency Medicine | Admitting: Emergency Medicine

## 2024-08-14 ENCOUNTER — Inpatient Hospital Stay: Admitting: Family Medicine

## 2024-08-14 DIAGNOSIS — R0789 Other chest pain: Secondary | ICD-10-CM

## 2024-08-14 DIAGNOSIS — J101 Influenza due to other identified influenza virus with other respiratory manifestations: Secondary | ICD-10-CM

## 2024-08-14 MED ORDER — SODIUM CHLORIDE 0.9 % IV BOLUS: 500.0000 mL | Freq: Once | INTRAVENOUS | Status: DC

## 2024-08-14 MED ORDER — OSELTAMIVIR PHOSPHATE 30 MG PO CAPS: 30.0000 mg | ORAL_CAPSULE | Freq: Two times a day (BID) | ORAL | 0 refills | Status: AC

## 2024-08-14 MED ORDER — ACETAMINOPHEN 500 MG PO TABS
1000.0000 mg | ORAL_TABLET | Freq: Once | ORAL | Status: AC
  Administered 2024-08-14: 1000 mg via ORAL
  Filled 2024-08-14: qty 2

## 2024-08-14 NOTE — Discharge Instructions (Addendum)
 Take Tylenol  650 mg every 6 hours as needed for aches/pain/fever.  Take Tamiflu  as prescribed for 5-day course.  Continue taking all of your other medications as previously prescribed including your nebulizer treatment.  Call your doctor in the morning for follow-up appointment.  Thank you for choosing us  for your health care today!  Please see your primary doctor this week for a follow up appointment.   If you have any new, worsening, or unexpected symptoms call your doctor right away or come back to the emergency department for reevaluation.  It was my pleasure to care for you today.   Ginnie EDISON Cyrena, MD

## 2024-08-14 NOTE — ED Provider Notes (Signed)
 "  [facility] Provider Note    Event Date/Time   First MD Initiated Contact with Patient 08/14/24 0003     (approximate)   History   Chest Pain and Cough   HPI  [name] is a 83 y.o. male   Past medical history of CAD, CHF, CKD, emphysema, type II diabetic, here with several days of cough and congestion and shortness of breath.  Productive sputum.  Was seen in the emergency department yesterday and breathing was better with nebulizer treatments and was noted to have had recent treatment with azithromycin  and prednisone  for COPD exacerbation recently.  Was given DuoNebs, doxycycline  and prednisone  yesterday for   Here for ongoing symptoms with cough especially worse at night.  Also burning sensation in the chest for 2 days straight.  He otherwise has been faring pretty well, able to get up and perform activities of daily living, no GI symptoms.  Independent Historian contributed to assessment above: His wife corroborates information above  External Medical Documents Reviewed: Previous hospital notes      Physical Exam   Triage Vital Signs: ED Triage Vitals  Encounter Vitals Group     BP 08/13/24 2207 (!) 143/79     Girls Systolic BP Percentile --      Girls Diastolic BP Percentile --      Boys Systolic BP Percentile --      Boys Diastolic BP Percentile --      Pulse Rate 08/13/24 2207 (!) 111     Resp 08/13/24 2207 20     Temp 08/13/24 2207 97.7 F (36.5 C)     Temp Source 08/13/24 2207 Oral     SpO2 08/13/24 2207 95 %     Weight 08/13/24 2206 210 lb (95.3 kg)     Height 08/13/24 2206 6' (1.829 m)     Head Circumference --      Peak Flow --      Pain Score 08/13/24 2206 10     Pain Loc --      Pain Education --      Exclude from Growth Chart --     Most recent vital signs: Vitals:   08/13/24 2207 08/14/24 0041  BP: (!) 143/79   Pulse: (!) 111 84  Resp: 20   Temp: 97.7 F (36.5 C)   SpO2: 95%     General: Awake, no  distress.  CV:  Good peripheral perfusion.  Resp:  Normal effort.  Abd:  No distention.  Other:  No respiratory distress normal vital signs no hypoxemia on room air.  No focality or wheezing or rales on examination.  No significant peripheral edema.   ED Results / Procedures / Treatments   Labs (all labs ordered are listed, but only abnormal results are displayed) Labs Reviewed  RESP PANEL BY RT-PCR (RSV, FLU A&B, COVID)  RVPGX2 - Abnormal; Notable for the following components:      Result Value   Influenza A by PCR POSITIVE (*)    All other components within normal limits  BASIC METABOLIC PANEL WITH GFR - Abnormal; Notable for the following components:   CO2 19 (*)    Glucose, Bld 294 (*)    BUN 47 (*)    Creatinine, Ser 1.81 (*)    Calcium  8.6 (*)    GFR, Estimated 37 (*)    Anion gap 16 (*)    All other components within normal limits  CBC - Abnormal; Notable for the following  components:   RBC 4.06 (*)    All other components within normal limits  TROPONIN T, HIGH SENSITIVITY     I ordered and reviewed the above labs they are notable for baseline renal function, hyperglycemic, influenza A positive, normal troponin.  EKG  ED ECG REPORT I, Ginnie Shams, the attending physician, personally viewed and interpreted this ECG.   Date: 08/14/2024  EKG Time: 2210  Rate: 104  Rhythm: sinus tachycardia  Axis: nl  Intervals:nl  ST&T Change: no stemi   PROCEDURES:  Critical Care performed: No  Procedures   MEDICATIONS ORDERED IN ED: Medications  sodium chloride  0.9 % bolus 500 mL (has no administration in time range)  acetaminophen  (TYLENOL ) tablet 1,000 mg (1,000 mg Oral Given 08/14/24 0052)    IMPRESSION / MDM / ASSESSMENT AND PLAN / ED COURSE  I reviewed the triage vital signs and the nursing notes.                                Patient's presentation is most consistent with acute presentation with potential threat to life or bodily  function.  Differential diagnosis includes, but is not limited to, viral illness, influenza, bacterial pneumonia, COPD exacerbation, CHF exacerbation, ACS PE or dissection   The patient is on the cardiac monitor to evaluate for evidence of arrhythmia and/or significant heart rate changes.  MDM:    Viral URI symptoms now testing positive for influenza A.  Nonspecific chest pain for the last 2 days constant with normal-appearing EKG and normal troponin doubt ACS.  No focality on exam, doubt bacterial pneumonia, but was already prescribed and is taking doxycycline  already.  Has been having moderate improvement in his shortness of breath at home with his nebulizer treatments, largely asymptomatic now.  No hypoxemia.  Given his age and comorbidities, I did consider admission and offered but given his ability to perform activities of daily living and his only chief complaint of worsening cough at night, he elected to continue with outpatient management at this time.  Fortunately he does have a primary care doctor appointment first thing in the morning tomorrow.  I started him on Tamiflu  with renal adjustment.  Discharge.        FINAL CLINICAL IMPRESSION(S) / ED DIAGNOSES   Final diagnoses:  Influenza A  Atypical chest pain     Rx / DC Orders   ED Discharge Orders          Ordered    oseltamivir  (TAMIFLU ) 30 MG capsule  2 times daily        08/14/24 0053             Note:  This document was prepared using Dragon voice recognition software and may include unintentional dictation errors.    Shams Ginnie, MD 08/14/24 [phone]  "

## 2024-08-16 ENCOUNTER — Inpatient Hospital Stay

## 2024-08-16 ENCOUNTER — Other Ambulatory Visit: Payer: Self-pay

## 2024-08-16 NOTE — Progress Notes (Signed)
 Treatment dates change per patient request.

## 2024-08-18 ENCOUNTER — Emergency Department: Admission: EM | Admit: 2024-08-18 | Discharge: 2024-08-18 | Disposition: A | Discharge status: Home / Self Care

## 2024-08-18 ENCOUNTER — Emergency Department

## 2024-08-18 ENCOUNTER — Other Ambulatory Visit: Payer: Self-pay

## 2024-08-18 DIAGNOSIS — J111 Influenza due to unidentified influenza virus with other respiratory manifestations: Secondary | ICD-10-CM | POA: Diagnosis not present

## 2024-08-18 DIAGNOSIS — R059 Cough, unspecified: Secondary | ICD-10-CM | POA: Diagnosis present

## 2024-08-18 DIAGNOSIS — J441 Chronic obstructive pulmonary disease with (acute) exacerbation: Secondary | ICD-10-CM | POA: Insufficient documentation

## 2024-08-18 LAB — CBC
HCT: 39.4 % (ref 39.0–52.0)
Hemoglobin: 13.4 g/dL (ref 13.0–17.0)
MCH: 32.8 pg (ref 26.0–34.0)
MCHC: 34 g/dL (ref 30.0–36.0)
MCV: 96.3 fL (ref 80.0–100.0)
Platelets: 195 K/uL (ref 150–400)
RBC: 4.09 MIL/uL — ABNORMAL LOW (ref 4.22–5.81)
RDW: 12.6 % (ref 11.5–15.5)
WBC: 7.8 K/uL (ref 4.0–10.5)
nRBC: 0 % (ref 0.0–0.2)

## 2024-08-18 LAB — BASIC METABOLIC PANEL WITH GFR
Anion gap: 13 (ref 5–15)
BUN: 41 mg/dL — ABNORMAL HIGH (ref 8–23)
CO2: 25 mmol/L (ref 22–32)
Calcium: 9.6 mg/dL (ref 8.9–10.3)
Chloride: 103 mmol/L (ref 98–111)
Creatinine, Ser: 1.67 mg/dL — ABNORMAL HIGH (ref 0.61–1.24)
GFR, Estimated: 40 mL/min — ABNORMAL LOW
Glucose, Bld: 117 mg/dL — ABNORMAL HIGH (ref 70–99)
Potassium: 4.1 mmol/L (ref 3.5–5.1)
Sodium: 141 mmol/L (ref 135–145)

## 2024-08-18 MED ORDER — GUAIFENESIN-CODEINE 100-10 MG/5ML PO SOLN
5.0000 mL | Freq: Once | ORAL | Status: AC
  Administered 2024-08-18: 5 mL via ORAL
  Filled 2024-08-18: qty 5

## 2024-08-18 MED ORDER — IBUPROFEN 600 MG PO TABS
600.0000 mg | ORAL_TABLET | Freq: Once | ORAL | Status: DC
  Filled 2024-08-18: qty 1

## 2024-08-18 MED ORDER — GUAIFENESIN-CODEINE 100-10 MG/5ML PO SOLN: 5.0000 mL | Freq: Three times a day (TID) | ORAL | 0 refills | Status: AC | PRN

## 2024-08-18 MED ORDER — IPRATROPIUM-ALBUTEROL 0.5-2.5 (3) MG/3ML IN SOLN
3.0000 mL | Freq: Once | RESPIRATORY_TRACT | Status: AC
  Administered 2024-08-18: 3 mL via RESPIRATORY_TRACT
  Filled 2024-08-18: qty 3

## 2024-08-18 MED ORDER — BENZONATATE 100 MG PO CAPS
100.0000 mg | ORAL_CAPSULE | Freq: Once | ORAL | Status: AC
  Administered 2024-08-18: 100 mg via ORAL
  Filled 2024-08-18: qty 1

## 2024-08-18 NOTE — ED Triage Notes (Signed)
 Pt comes with rattling in chest that sounds like popcorn. Pt states he was dx with flu on 31st. Pt states he has taken all the meds but nothing has gotten better.

## 2024-08-18 NOTE — ED Notes (Signed)
 EDP at bedside

## 2024-08-18 NOTE — ED Provider Notes (Signed)
 "  [facility] Provider Note    Event Date/Time   First MD Initiated Contact with Patient 08/18/24 1740     (approximate)   History   Shortness of Breath   HPI  [name] is a 84 y.o. male presenting with concern of flulike illness.  Third visit over the last week with similar complaints, primarily presents with concern of cough and fatigue.  Was seen here on the 31st I reviewed his ER visit note at that time, was diagnosed with the flu he did get multiple nebulizers symptoms improved.  Since being home he has been using his albuterol  at home and it seems that his symptoms have been well-controlled his primary complaint and presentation today is because whenever he tries to go to sleep or lay flat he has a rattle and a cough which causes him to have difficulty sleeping.  He states over the last 3 days he has had about 3 hours of sleep every night.  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  He states that he is able to ambulate if he needed to to the waiting room without having to sit down and catch his breath and he denies any associated chest pain.  He has not noticed any fevers at home.     Physical Exam   Triage Vital Signs: ED Triage Vitals  Encounter Vitals Group     BP 08/18/24 1541 (!) 147/72     Girls Systolic BP Percentile --      Girls Diastolic BP Percentile --      Boys Systolic BP Percentile --      Boys Diastolic BP Percentile --      Pulse Rate 08/18/24 1541 66     Resp 08/18/24 1541 20     Temp 08/18/24 1541 98.5 F (36.9 C)     Temp src --      SpO2 08/18/24 1541 95 %     Weight 08/18/24 1542 210 lb (95.3 kg)     Height 08/18/24 1542 6' (1.829 m)     Head Circumference --      Peak Flow --      Pain Score  08/18/24 1539 2     Pain Loc --      Pain Education --      Exclude from Growth Chart --     Most recent vital signs: Vitals:   08/18/24 1928 08/18/24 2009  BP:  (!) 133/54  Pulse:  (!) 54  Resp:  20  Temp: 97.6 F (36.4 C)   SpO2:  100%     General: Awake, no distress.  CV:  Good peripheral perfusion.  Resp:  Normal effort.  Able to speak in full sentences, faint wheezes appreciated to lower lobes bilaterally Abd:  No distention.  Soft nontender Other:     ED Results / Procedures / Treatments   Labs (all labs ordered are listed, but only abnormal results are displayed) Labs Reviewed  BASIC METABOLIC PANEL WITH GFR - Abnormal; Notable for the following components:      Result Value   Glucose, Bld 117 (*)    BUN 41 (*)    Creatinine, Ser 1.67 (*)  GFR, Estimated 40 (*)    All other components within normal limits  CBC - Abnormal; Notable for the following components:   RBC 4.09 (*)    All other components within normal limits     EKG  On my independent interpretation, appears to be a sinus rhythm with rate of about 75, axis of -45, QRS appears widened, consistent with a left bundle branch block, remaining intervals appear to be within normal limits, no obvious ischemia and I appreciate this EKG   RADIOLOGY  On my independent interpretation, no acute findings appreciated on chest x-ray  PROCEDURES:  Critical Care performed: No  Procedures   MEDICATIONS ORDERED IN ED: Medications  ibuprofen  (ADVIL ) tablet 600 mg (600 mg Oral Patient Refused/Not Given 08/18/24 1831)  guaiFENesin -codeine  100-10 MG/5ML solution 5 mL (5 mLs Oral Given 08/18/24 1831)  ipratropium-albuterol  (DUONEB) 0.5-2.5 (3) MG/3ML nebulizer solution 3 mL (3 mLs Nebulization Given 08/18/24 1831)  benzonatate  (TESSALON ) capsule 100 mg (100 mg Oral Given 08/18/24 1920)     IMPRESSION / MDM / ASSESSMENT AND PLAN / ED COURSE  I reviewed the triage vital signs and the nursing notes.                                Patient's presentation is most consistent with acute complicated illness / injury requiring diagnostic workup.  84 year old male who presents today with concern of cough and rattling in the chest.  He appears well he is not in any acute distress his vitals here are reassuring while in the exam room he saturating at 99 to 100% he does not have any evidence of tachypnea.  His chest x-ray is without acute findings.  He did test positive for the influenza about 4 days ago and he completed a course of Tamiflu  and also antibiotics in the outpatient setting.  Seems that his symptoms are improving but he is frustrated with his cough at night.  He has tried Delsym  and Robitussin at home without success.  Will give a dose of cough medicine here as I feel this will be beneficial for him and allow him to sleep at night.  I considered admission given his multiple repeated visits here however he clinically appears well he is saturating appropriately, I feel likely does not warrant admission at this time.  Will attempt nebulizer symptomatic management and reassess.  Clinical Course as of 08/18/24 2347  [place] Aug 18, 2024  1942 Patient labs are reassuring he is able to ambulate without significant assistance.  His cough seems to have improved at this time.  He is comfortable with discharge home.  I discussed return precautions, he verbalized understanding. [SK]    Clinical Course User Index [SK] Fernand Rossie HERO, MD     FINAL CLINICAL IMPRESSION(S) / ED DIAGNOSES   Final diagnoses:  Flu  COPD exacerbation (HCC)     Rx / DC Orders   ED Discharge Orders          Ordered    guaiFENesin -codeine  100-10 MG/5ML syrup  3 times daily PRN        08/18/24 1944             Note:  This document was prepared using Dragon voice recognition software and may include unintentional dictation errors.   Fernand Rossie HERO, MD 08/18/24 [phone]  "

## 2024-08-18 NOTE — Discharge Instructions (Addendum)
 You were seen today due to concern of cough.  At this time I have written for some cough medication for you to take, please take this as instructed.  While taking this please avoid driving or operating any heavy machinery as this can make you lightheaded.  If you have any worsening of symptoms such as fevers, shortness of breath, or any other symptoms you find concerning please return to the emergency department immediately for further medical management.

## 2024-08-18 NOTE — ED Notes (Addendum)
 Pt ambulated in room, gait steady.  Tolerated well.  No drop in O2 noted. EDP Fernand notified.

## 2024-08-21 ENCOUNTER — Encounter: Payer: Self-pay | Admitting: Pulmonary Disease

## 2024-08-21 DIAGNOSIS — J4541 Moderate persistent asthma with (acute) exacerbation: Secondary | ICD-10-CM

## 2024-08-21 DIAGNOSIS — R058 Other specified cough: Secondary | ICD-10-CM

## 2024-08-21 MED ORDER — METHYLPREDNISOLONE 4 MG PO TBPK: ORAL_TABLET | ORAL | 0 refills | Status: DC

## 2024-08-21 NOTE — Telephone Encounter (Signed)
 He likely has developed postinfectious cough (postviral cough) this can last up to 8 weeks after infection.  Unfortunately there is little that we will control this due to the inflammatory reaction in the airways after the viral infection.  We can try a longer Medrol  taper and he should take Mucinex  plain, extra strength over-the-counter twice a day to keep mucus loose.  We can move up his appointment to see me and get a chest x-ray prior to the visit.  I sent a prescription of Medrol  to the pharmacy.

## 2024-08-22 NOTE — Progress Notes (Signed)
 [facility] OFFICE PROGRESS NOTE  Patient Care Team: Rilla Baller, MD as PCP - General (Family Medicine) Perla Evalene PARAS, MD as Consulting Physician (Cardiology) Tamea Dedra CROME, MD as Consulting Physician (Pulmonary Disease) Darron Deatrice LABOR, MD as Consulting Physician (Cardiology) Pa, [facility] (Ophthalmology) Tina Pauletta BROCKS, MD as Consulting Physician (Oncology) Twylla Glendia BROCKS, MD (Urology)  [name] is a 84 y.o.male with history of DVT, CKD 3, CAD with MI, CABG, emphysema, hypertension, hyperlipidemia, type 2 diabetes, carotid artery disease, BPH, DDD being [facility] for BCG unresponsive NMIBC. Most recent pathology showed CIS.   Patient started intravesical gemcitabine  with docetaxel  without issue.  Induction: 8/1 to 04/19/24 weekly x 6 Maintenance: 05/17/2024  Last cystoscopy in December without any recurrence.  Patient recently presented with flu.  Treatment was postponed.  He is recovering slow and still pretty tired. Renal function decreased and not as much fluid intake. Will postpone treatment and IVF will be given today. Assessment & Plan   No orders of the defined types were placed in this encounter.    Pauletta BROCKS Tina, MD  INTERVAL HISTORY: Patient returns for follow-up.  Recently had fluid and postpone treatment.  He is recovering slow and still pretty tired. Renal function decreased and not as much fluid intake.  Oncology History  Urothelial carcinoma of bladder (HCC)  10/19/2021 Pathology Results   DIAGNOSIS:  A. URINARY BLADDER TUMOR; TRANSURETHRAL RESECTION:  - NONINVASIVE PAPILLARY UROTHELIAL CARCINOMA, HIGH-GRADE (WHO/ISUP).  - MUSCULARIS PROPRIA PRESENT AND UNINVOLVED.   B. URINARY BLADDER, TUMOR BASE; TRANSURETHRAL RESECTION:  - BENIGN MUSCULARIS PROPRIA.  - NO DEFINITE INTACT UROTHELIUM IDENTIFIED.  - NEGATIVE FOR MALIGNANCY.   C. URINARY BLADDER, LEFT POSTERIOR WALL; TRANSURETHRAL RESECTION:  -  NONINVASIVE PAPILLARY UROTHELIAL CARCINOMA, LOW-GRADE (WHO/ISUP).  - NO MUSCULARIS PROPRIA IDENTIFIED.    11/12/2021 Initial Diagnosis   Urothelial carcinoma of bladder (HCC)   11/15/2022 Pathology Results   IAGNOSIS:  A. URINARY BLADDER, RIGHT LATERAL WALL; TRANSURETHRAL RESECTION:  - NONINVASIVE PAPILLARY UROTHELIAL CARCINOMA, HIGH-GRADE (WHO/ISUP).  - NO MUSCULARIS PROPRIA IDENTIFIED.   B. URINARY BLADDER TUMOR BASE, RIGHT LATERAL WALL; TRANSURETHRAL BIOPSY:  - PARTIALLY DENUDED, BENIGN APPEARING UROTHELIAL TISSUE.  - MUSCULARIS PROPRIA PRESENT AND UNINVOLVED.   C. URINARY BLADDER, LEFT LATERAL WALL; TRANSURETHRAL RESECTION:  - NONINVASIVE PAPILLARY UROTHELIAL CARCINOMA, LOW-GRADE (WHO/ISUP).  - NO MUSCULARIS PROPRIA IDENTIFIED.    01/16/2024 Pathology Results   1. 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,    03/15/2024 -  Chemotherapy   Patient is on Treatment Plan : BLADDER Gemcitabine  (1000), Docetaxel  (37.5) INTRAVESICAL q7d     03/15/2024 Cancer Staging   Staging form: Urinary Bladder, AJCC 8th Edition - Clinical: Stage 0is (cTis, cN0, cM0) - Signed by Tina Pauletta BROCKS, MD on 03/15/2024 WHO/ISUP grade (low/high): High Grade Histologic grading system: 2 grade system      PHYSICAL EXAMINATION: ECOG PERFORMANCE STATUS: 1  Vitals:   08/23/24 1030  BP: (!) 121/49  Pulse: 60  Resp: 18  Temp: (!) 97.3 F (36.3 C)   Filed Weights   08/23/24 1030  Weight: 218 lb 11.2 oz (99.2 kg)    GENERAL: alert, no distress and comfortable SKIN: skin color normal and no jaundice  LUNGS: clear to auscultation and no wheeze or rales with normal breathing effort HEART: regular rate & rhythm    Relevant data reviewed during this visit included labs.

## 2024-08-23 ENCOUNTER — Inpatient Hospital Stay

## 2024-08-23 ENCOUNTER — Other Ambulatory Visit: Payer: Self-pay | Admitting: Family Medicine

## 2024-08-23 ENCOUNTER — Ambulatory Visit: Payer: Self-pay

## 2024-08-23 VITALS — BP 121/49 | HR 60 | Temp 97.3°F | Resp 18 | Wt 218.7 lb

## 2024-08-23 VITALS — BP 124/43 | HR 66 | Temp 97.9°F | Resp 18

## 2024-08-23 DIAGNOSIS — Z08 Encounter for follow-up examination after completed treatment for malignant neoplasm: Secondary | ICD-10-CM | POA: Diagnosis present

## 2024-08-23 DIAGNOSIS — E860 Dehydration: Secondary | ICD-10-CM | POA: Insufficient documentation

## 2024-08-23 DIAGNOSIS — N179 Acute kidney failure, unspecified: Secondary | ICD-10-CM | POA: Diagnosis not present

## 2024-08-23 DIAGNOSIS — C679 Malignant neoplasm of bladder, unspecified: Secondary | ICD-10-CM

## 2024-08-23 DIAGNOSIS — Z8551 Personal history of malignant neoplasm of bladder: Secondary | ICD-10-CM | POA: Diagnosis present

## 2024-08-23 LAB — CMP (CANCER CENTER ONLY)
ALT: 12 U/L (ref 0–44)
AST: 18 U/L (ref 15–41)
Albumin: 3.7 g/dL (ref 3.5–5.0)
Alkaline Phosphatase: 37 U/L — ABNORMAL LOW (ref 38–126)
Anion gap: 10 (ref 5–15)
BUN: 41 mg/dL — ABNORMAL HIGH (ref 8–23)
CO2: 26 mmol/L (ref 22–32)
Calcium: 8.8 mg/dL — ABNORMAL LOW (ref 8.9–10.3)
Chloride: 102 mmol/L (ref 98–111)
Creatinine: 2.1 mg/dL — ABNORMAL HIGH (ref 0.61–1.24)
GFR, Estimated: 31 mL/min — ABNORMAL LOW
Glucose, Bld: 137 mg/dL — ABNORMAL HIGH (ref 70–99)
Potassium: 5 mmol/L (ref 3.5–5.1)
Sodium: 138 mmol/L (ref 135–145)
Total Bilirubin: 0.4 mg/dL (ref 0.0–1.2)
Total Protein: 6.3 g/dL — ABNORMAL LOW (ref 6.5–8.1)

## 2024-08-23 LAB — CBC WITH DIFFERENTIAL (CANCER CENTER ONLY)
Abs Immature Granulocytes: 0.07 K/uL (ref 0.00–0.07)
Basophils Absolute: 0 K/uL (ref 0.0–0.1)
Basophils Relative: 0 %
Eosinophils Absolute: 0.3 K/uL (ref 0.0–0.5)
Eosinophils Relative: 3 %
HCT: 36.5 % — ABNORMAL LOW (ref 39.0–52.0)
Hemoglobin: 12.5 g/dL — ABNORMAL LOW (ref 13.0–17.0)
Immature Granulocytes: 1 %
Lymphocytes Relative: 14 %
Lymphs Abs: 1.2 K/uL (ref 0.7–4.0)
MCH: 32.4 pg (ref 26.0–34.0)
MCHC: 34.2 g/dL (ref 30.0–36.0)
MCV: 94.6 fL (ref 80.0–100.0)
Monocytes Absolute: 0.8 K/uL (ref 0.1–1.0)
Monocytes Relative: 10 %
Neutro Abs: 5.9 K/uL (ref 1.7–7.7)
Neutrophils Relative %: 72 %
Platelet Count: 189 K/uL (ref 150–400)
RBC: 3.86 MIL/uL — ABNORMAL LOW (ref 4.22–5.81)
RDW: 12.7 % (ref 11.5–15.5)
WBC Count: 8.2 K/uL (ref 4.0–10.5)
nRBC: 0 % (ref 0.0–0.2)

## 2024-08-23 LAB — URINALYSIS, COMPLETE (UACMP) WITH MICROSCOPIC
Bilirubin Urine: NEGATIVE
Glucose, UA: NEGATIVE mg/dL
Hgb urine dipstick: NEGATIVE
Ketones, ur: NEGATIVE mg/dL
Leukocytes,Ua: NEGATIVE
Nitrite: POSITIVE — AB
Protein, ur: NEGATIVE mg/dL
Specific Gravity, Urine: 1.017 (ref 1.005–1.030)
pH: 5 (ref 5.0–8.0)

## 2024-08-23 MED ORDER — SODIUM CHLORIDE 0.9 % IV SOLN: Freq: Once | INTRAVENOUS | Status: AC

## 2024-08-23 NOTE — Assessment & Plan Note (Signed)
 Postpone this cycle of maintenance gem/doce Sodium bicarb 1300 mg the night before and the morning of treatment Pyridium  200 mg as needed 3 times a day on day 1 and 2 if having urinary pain Fridays works well. Follow up with Dr. Twylla in 3 months as planned for cystoscopy Return in Feb for labs, follow-up with treatment

## 2024-08-23 NOTE — Assessment & Plan Note (Signed)
 Hold treatment this month IVF today.

## 2024-08-23 NOTE — Assessment & Plan Note (Signed)
 IVF today.  Postpone treatment Increase fluid at home

## 2024-08-26 ENCOUNTER — Other Ambulatory Visit: Payer: Self-pay

## 2024-08-26 ENCOUNTER — Ambulatory Visit
Admission: RE | Admit: 2024-08-26 | Discharge: 2024-08-26 | Disposition: A | Discharge status: Home / Self Care | Attending: Pulmonary Disease | Admitting: Pulmonary Disease

## 2024-08-26 ENCOUNTER — Ambulatory Visit
Admission: RE | Admit: 2024-08-26 | Discharge: 2024-08-26 | Disposition: A | Discharge status: Home / Self Care | Source: Ambulatory Visit | Attending: Pulmonary Disease | Admitting: Pulmonary Disease

## 2024-08-26 ENCOUNTER — Encounter: Payer: Self-pay | Admitting: Pulmonary Disease

## 2024-08-26 ENCOUNTER — Ambulatory Visit: Admitting: Pulmonary Disease

## 2024-08-26 VITALS — BP 120/60 | HR 64 | Temp 98.1°F | Ht 72.0 in | Wt 218.0 lb

## 2024-08-26 DIAGNOSIS — R051 Acute cough: Secondary | ICD-10-CM | POA: Insufficient documentation

## 2024-08-26 DIAGNOSIS — Z87891 Personal history of nicotine dependence: Secondary | ICD-10-CM | POA: Diagnosis not present

## 2024-08-26 DIAGNOSIS — R058 Other specified cough: Secondary | ICD-10-CM | POA: Diagnosis not present

## 2024-08-26 DIAGNOSIS — J4489 Other specified chronic obstructive pulmonary disease: Secondary | ICD-10-CM

## 2024-08-26 DIAGNOSIS — J4551 Severe persistent asthma with (acute) exacerbation: Secondary | ICD-10-CM | POA: Diagnosis not present

## 2024-08-26 MED ORDER — METHYLPREDNISOLONE 4 MG PO TABS: 4.0000 mg | ORAL_TABLET | Freq: Every day | ORAL | 0 refills | Status: AC

## 2024-08-26 NOTE — Telephone Encounter (Signed)
 ERx

## 2024-08-26 NOTE — Patient Instructions (Signed)
 VISIT SUMMARY:  During your visit, we discussed your ongoing chest congestion and asthma management. You have been experiencing persistent chest congestion despite previous treatments, and we reviewed your current medications and symptoms.  YOUR PLAN:  -SEVERE PERSISTENT ASTHMA WITH ACUTE EXACERBATION: Severe persistent asthma is a chronic condition where the airways in your lungs are inflamed and narrowed, causing difficulty in breathing. An acute exacerbation means that your symptoms have suddenly worsened. Your symptoms have improved with the current treatment, but there was a recurrence after completing a short course of prednisone . Your lung sounds are clear, indicating reduced inflammation. You should continue taking Mucinex  to help manage your symptoms. Once you finish your current supply of medrol , you will start taking medrol  4 mg tablets, 1 tablet daily, for an additional week to maintain symptom control. We will have a follow-up appointment in three weeks. If any issues arise, please use the patient portal to communicate with us .  INSTRUCTIONS:  Please continue taking Mucinex  as directed. After finishing your current supply of Medrol  start taking medrol  4 mg tablets, 1 tablet daily, for an additional week. We will see you for a follow-up appointment in three weeks. If you experience any issues or have concerns, use the patient portal to reach out to us .

## 2024-08-26 NOTE — Progress Notes (Signed)
 "  Subjective:    Patient ID: [name], male    DOB: 1941/06/22, 84 y.o.   MRN: [id number]  Patient Care Team: Rilla Baller, MD as PCP - General (Family Medicine) Perla Evalene PARAS, MD as Consulting Physician (Cardiology) Tamea Dedra LITTIE, MD as Consulting Physician (Pulmonary Disease) Darron Deatrice LABOR, MD as Consulting Physician (Cardiology) Pa, [facility] (Ophthalmology) Tina Pauletta BROCKS, MD as Consulting Physician (Oncology) Twylla Glendia BROCKS, MD (Urology)  Chief Complaint  Patient presents with   Acute Visit    Cough and chest congestion on going since 08/12/24. Patient is currently on Prednisone . Using trelegy daily and proventil  daily.     BACKGROUND/INTERVAL:[name] is an 84 year old former smoker (25 PY) who presents for an acute visit in the setting of COVID-19 infection on 14 October 2023.  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 Dupixent  was started on 29 August 2023. Patient was last seen on 25 July 2024.  Presents today for ACUTE visit.  HPI Discussed the use of AI scribe software for clinical note transcription with the patient, who gave verbal consent to proceed.  History of Present Illness   [name] is an 84 year old male with moderate to severe persistent asthma who presents with ongoing chest congestion.  He has ongoing chest congestion that has persisted despite previous treatments. Initially, he was prescribed an antibiotic and prednisone , which provided some relief, but symptoms returned after completing the medication. The congestion has been lingering and has not completely resolved.  The prednisone  given was only for 3 days.  This was through the ED/urgent care.  As soon as he completed that prednisone  his symptoms returned.  On 7 January he called the office because of persistent chest congestion and intractable cough.  He was not having  fevers, chills or sweats and no discoloration of the sputum.  A diagnosis of postviral cough syndrome was postulated, the patient had a Medrol  Dosepak sent to his pharmacy and instructed to take Mucinex  twice a day we arrange for follow-up today.  He notes to feel better since the Medrol  was started and since he has been using Mucinex .  He has a history of moderate to severe persistent asthma and has been using Mucinex , which has helped clear the congestion. He is not currently on any antibiotics. He carries nitroglycerin  due to experiencing a couple of spells that he describes as being similar to indigestion.  He is scheduled for arm surgery on January 28th but is uncertain if he will be ready due to his current health status. He experiences numbness in two fingers, which is attributed to nerve damage issues.  He has about two days of Medrol  left. He notes that he no longer hears a rattle when taking deep breaths, which was present the previous day.  No coughing up any sputum in the last two days.       Review of Systems A 10 point review of systems was performed and it is as noted above otherwise negative.   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  Cervical spondylosis with radiculopathy 07/11/2022   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   AKI (acute kidney injury) 07/09/2021   OSA (obstructive sleep apnea)    Chronic heart failure with preserved ejection fraction (HFpEF) (HCC)    Nocturnal hypoxia 06/11/2021   CKD (chronic kidney disease) stage 3, GFR 30-59 ml/min (HCC) 02/24/2021    Peripheral neuropathy 02/23/2021   Normocytic anemia 02/13/2021   Spondylolisthesis of lumbar region 07/07/2020   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   Coccydynia 09/20/2019   Nasal sinus congestion 07/19/2019   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   Anxiety 06/04/2019   Chronic right shoulder pain 09/10/2018   Cervical stenosis of spinal canal 06/05/2017   Peripheral edema 06/05/2017   Encounter for general adult medical examination with abnormal findings 12/06/2016   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/13/2016   Overweight (BMI 25.0-29.9) 07/04/2016   Low vitamin B12 level 01/01/2016   Exertional dyspnea 07/07/2015   Unstable angina (HCC)    Medicare annual wellness visit, subsequent 07/03/2015   Advanced care planning/counseling discussion 07/03/2015   Type 2 diabetes mellitus with other specified complication (HCC) 05/04/2015   Pleuritic chest pain 05/04/2015   Ex-smoker 05/04/2015   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   Syncopal vertigo 11/04/2010   Lumbar disc disease with radiculopathy 11/04/2010   Serous otitis media 06/07/2010   Coronary artery disease involving native coronary artery of native heart with unstable angina pectoris (HCC) 08/11/2009   PAD (peripheral artery disease) 08/11/2009   DUPUYTREN'S CONTRACTURE, RIGHT 10/29/2008   Carotid stenosis 07/09/2008   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   Benign prostatic hyperplasia with urinary obstruction 09/06/2007   Essential hypertension 04/30/2007   GERD 04/30/2007   Hyperlipidemia associated with type 2 diabetes mellitus (HCC) 04/26/2007   OA (osteoarthritis) of knee 04/26/2007   DVT, HX OF 04/26/2007    Social History   Tobacco Use   Smoking status: Former    Current packs/day: 0.00    Average packs/day: 1 pack/day for 25.0 years (25.0 ttl pk-yrs)    Types: Cigarettes    Start date: 08/16/1963    Quit date: 08/15/1988    Years since quitting: 36.0    Passive exposure:  Past   Smokeless tobacco: Never  Substance Use Topics   Alcohol use: No    Alcohol/week: 0.0 standard drinks of alcohol    Allergies[1]  Active Medications[2]  Immunization History  Administered Date(s) Administered   Fluad Quad(high Dose 65+) 04/17/2019, 04/15/2022, 05/08/2023   INFLUENZA, HIGH DOSE SEASONAL PF 05/15/2013, 06/25/2015, 07/05/2021, 05/08/2023, 05/14/2024   Influenza Whole 08/15/2000, 07/04/2007, 05/09/2008, 05/31/2010   Influenza,inj,Quad PF,6+ Mos 04/17/2014, 05/18/2016, 06/07/2018   Influenza-Unspecified 05/22/2017, 05/28/2020, 08/05/2022   PFIZER(Purple Top)SARS-COV-2 Vaccination 09/07/2019, 09/28/2019, 05/12/2020, 01/19/2021   PNEUMOCOCCAL CONJUGATE-20 09/09/2023   Pneumococcal Conjugate-13 02/09/2015   Pneumococcal Polysaccharide-23 08/15/2000, 10/29/2008   Respiratory Syncytial Virus Vaccine,Recomb Aduvanted(Arexvy) 10/10/2022   Td 08/15/2005   Tdap 02/10/2018   Zoster Recombinant(Shingrix) 04/05/2017, 07/25/2017   Zoster, Live 02/04/2009        Objective:     Vitals:   08/26/24 1311  BP: 120/60  Pulse: 64  Temp: 98.1 F (36.7 C)  Height: 6' (1.829 m)  Weight: 218 lb (98.9 kg)  SpO2: 95%  TempSrc: Temporal  BMI (Calculated): 29.56     SpO2: 95 %  GENERAL: This is a well-developed, overweight gentleman, acute on chronically ill-appearing, awake, alert, no acute distress.  Fully ambulatory. HEAD: Normocephalic, atraumatic.  EYES: Pupils equal, round, reactive to light.  No  scleral icterus.  MOUTH: Nose/mouth/throat not examined due to institutional masking requirements. NECK: Supple. No thyromegaly. Trachea midline. No JVD.  No adenopathy.  Anterior cervical fusion incision, well healed. PULMONARY: Good air entry bilaterally, no adventitious sounds. CARDIOVASCULAR: S1 and S2. Regular rate and rhythm.  Grade 1/6 systolic ejection murmur left sternal border. GASTROINTESTINAL: Protuberant abdomen, soft, no tenderness.   MUSCULOSKELETAL: No  joint deformity, no clubbing, no edema.  NEUROLOGIC: No overt focal deficits noted.  Speech is fluent.   SKIN: Intact,warm,dry.  Limited exam shows no rashes.  He has multiple purpura and ecchymoses in the upper extremities. PSYCH: Mood and behavior normal.   Chest x-ray performed today, independently reviewed, shows no overt infiltrate or acute process:       Assessment & Plan:     ICD-10-CM   1. Post-viral cough syndrome  R05.8     2. Severe persistent asthma with acute exacerbation (HCC)  J45.51      Meds ordered this encounter  Medications   methylPREDNISolone  (MEDROL ) 4 MG tablet    Sig: Take 1 tablet (4 mg total) by mouth daily for 7 days.    Dispense:  7 tablet    Refill:  0   Discussion:    Severe persistent asthma with acute exacerbation Moderate to severe persistent asthma with recent acute exacerbation. Symptoms have improved with current treatment, antibiotics not indicated at present. No current cough or sputum production. Lung sounds are clear, indicating reduced inflammation. He is on a lower taper of Medrol  to manage symptoms and prevent recurrence. - Continue Mucinex  for symptom management. - Prescribed Medrol  4 mg tablets for an additional week to maintain symptom control. - Scheduled follow-up appointment in three weeks. - Advised to use the patient portal or call for communication if issues arise.      Advised if symptoms do not improve or worsen, to please contact office for sooner follow up or seek emergency care.    I spent 30 minutes of dedicated to the care of this patient on the date of this encounter to include pre-visit review of records, face-to-face time with the patient discussing conditions above, post visit ordering of testing, clinical documentation with the electronic health record, making appropriate referrals as documented, and communicating necessary findings to members of the patients care team.     C. Leita Sanders, MD Advanced  Bronchoscopy PCCM [facility]    *This note was generated using voice recognition software/Dragon and/or AI transcription program.  Despite best efforts to proofread, errors can occur which can change the meaning. Any transcriptional errors that result from this process are unintentional and may not be fully corrected at the time of dictation.    [1]  Allergies Allergen Reactions   Vioxx [Rofecoxib] Other (See Comments)    Hemorrhage    Oxycodone  Other (See Comments)    Could not wake up , felt drunk   Spiriva  Respimat [Tiotropium Bromide] Other (See Comments)    Elevated bp   Contrast Media [Iodinated Contrast Media] Itching and Rash    Delayed reaction post abdominal aortagram.    Morphine  Nausea Only and Other (See Comments)    Irritability  morphine   [2]  Current Meds  Medication Sig   albuterol  (ACCUNEB ) 0.63 MG/3ML nebulizer solution Take 3 mLs (0.63 mg total) by nebulization every 6 (six) hours as needed for wheezing.   aspirin  EC 81 MG EC tablet Take 1 tablet (81 mg total) by mouth daily.   atorvastatin  (LIPITOR) 20 MG tablet Take  1 tablet (20 mg total) by mouth daily.   Cyanocobalamin  (VITAMIN B-12 ER PO) Take 1 capsule by mouth daily.   docusate sodium  (COLACE) 100 MG capsule Take 1 capsule (100 mg total) by mouth daily as needed for mild constipation.   ezetimibe  (ZETIA ) 10 MG tablet TAKE 1 TABLET BY MOUTH ONCE DAILY   famotidine  (PEPCID ) 20 MG tablet Take 20 mg by mouth daily.   fluorouracil  (EFUDEX ) 5 % cream Apply to nose twice a day x 7 days   fluticasone  (FLONASE ) 50 MCG/ACT nasal spray Place 1 spray into both nostrils daily as needed for allergies or rhinitis.   Fluticasone -Umeclidin-Vilant (TRELEGY ELLIPTA ) 200-62.5-25 MCG/ACT AEPB Inhale 1 puff into the lungs daily.   furosemide  (LASIX ) 40 MG tablet Take 0.5 tablets (20 mg total) by mouth daily as needed. Take 1 tablet (40 mg) daily as need for weight greater then 208.   gabapentin  (NEURONTIN )  600 MG tablet Take 0.5 tablets (300 mg total) by mouth 2 (two) times daily AND 1 tablet (600 mg total) at bedtime.   guaiFENesin -codeine  100-10 MG/5ML syrup Take 5 mLs by mouth 3 (three) times daily as needed for cough.   HYDROcodone -acetaminophen  (NORCO/VICODIN) 5-325 MG tablet Take 1-2 tablets by mouth every 6 (six) hours as needed.   hydrocortisone  2.5 % cream Apply to chin and beside the nose at bedtime Tues, Thurs, Sat   isosorbide  mononitrate (IMDUR ) 30 MG 24 hr tablet TAKE 1 TABLET BY MOUTH DAILY.   ketoconazole  (NIZORAL ) 2 % cream Apply to chin and beside the nose at bedtime Mon, Wed, Fri   losartan  (COZAAR ) 25 MG tablet Take 1 tablet (25 mg total) by mouth daily.   methylPREDNISolone  (MEDROL  DOSEPAK) 4 MG TBPK tablet Take as directed in the package.   methylPREDNISolone  (MEDROL ) 4 MG tablet Take 1 tablet (4 mg total) by mouth daily for 7 days.   metoprolol  succinate (TOPROL  XL) 25 MG 24 hr tablet Take 0.5 tablets (12.5 mg total) by mouth daily.   montelukast  (SINGULAIR ) 10 MG tablet TAKE 1 TABLET BY MOUTH DAILY   mupirocin  ointment (BACTROBAN ) 2 % Apply 1 Application topically 2 (two) times daily.   nitroGLYCERIN  (NITROSTAT ) 0.4 MG SL tablet DISSOLVE ONE TABLET UNDER THE TONGUE AS NEEDED FOR CHEST PAIN. MAY REPEAT AS INDICATED BY YOUR DOCTOR.   ondansetron  (ZOFRAN ) 8 MG tablet Take 1 tablet (8 mg total) by mouth every 8 (eight) hours as needed for nausea or vomiting.   phenazopyridine  (PYRIDIUM ) 200 MG tablet Take 1 tablet (200 mg total) by mouth 3 (three) times daily as needed for pain. One on day before treatment, then as needed   polyethylene glycol (MIRALAX  / GLYCOLAX ) 17 g packet Take 17 g by mouth daily as needed for mild constipation.    prochlorperazine  (COMPAZINE ) 10 MG tablet Take 1 tablet (10 mg total) by mouth every 6 (six) hours as needed for nausea or vomiting.   ranolazine  (RANEXA ) 500 MG 12 hr tablet TAKE ONE (1) TABLET BY MOUTH TWO TIMES PER DAY   sodium bicarbonate  650 MG  tablet Take 2 tabs (1300 mg) of sodium bicarbonate  (two 650 mg tabs) the night before and morning of therapy   spironolactone  (ALDACTONE ) 25 MG tablet TAKE ONE-HALF (1/2) TABLET (12.5 MG) BY MOUTH ONCE DAILY   tamsulosin  (FLOMAX ) 0.4 MG CAPS capsule TAKE 1 CAPSULE BY MOUTH AT BEDTIME   Vibegron  (GEMTESA ) 75 MG TABS Take 1 tablet (75 mg total) by mouth daily.   "

## 2024-08-27 ENCOUNTER — Ambulatory Visit

## 2024-08-27 NOTE — Progress Notes (Deleted)
 "   Virtual Visit via Telephone Note   Because of [name] co-morbid illnesses, he is at least at moderate risk for complications without adequate follow up.  This format is felt to be most appropriate for this patient at this time.  Due to technical limitations with video connection (technology), today's appointment will be conducted as an audio only telehealth visit, and [name] verbally agreed to proceed in this manner.   All issues noted in this document were discussed and addressed.  No physical exam could be performed with this format.  Evaluation Performed:  Preoperative cardiovascular risk assessment _____________   Date:  08/27/2024   Patient ID:  [name], DOB May 16, 1941, MRN [id number] Patient Location:  Home Provider location:   Office  Primary Care Provider:  Rilla Baller, MD Primary Cardiologist:  None  Chief Complaint / Patient Profile   84 y.o. y/o male with a h/o CAD s/p CABG in 1990, PCI of LCx in 2002, cardiac cath 2024 revealed patent grafts with severe stenosis to mid to distal LCx, moderately reduced LVEF treated with PCI/DES to LCx, repeat cath 01/2024 with widely patent LIMA-LAD, SVG- D1-D2, widely patent prox and mid LCx stents, DVT in LLE, HLD, hip replacement x 2, PAD, bilateral SFA disease now chronically occluded but no critical limb ischemia, peripheral neuropathy, type 2 DM, who is pending right hand carpal tunnel release right elbow ulnar release on 09/12/23 and presents today for telephonic preoperative cardiovascular risk assessment.  History of Present Illness    [name] is a 84 y.o. male who presents via audio/video conferencing for a telehealth visit today.  Pt was last seen in cardiology clinic on 05/08/24 by Dr. Darron .  At that time [name] was doing well.  The patient is now pending procedure as outlined above. Since his last visit, he *** denies chest pain, shortness of breath, lower extremity edema,  fatigue, palpitations, melena, hematuria, hemoptysis, diaphoresis, weakness, presyncope, syncope, orthopnea, and PND.   Past Medical History    Past Medical History:  Diagnosis Date   Actinic keratosis    Allergy    Anginal pain    Anxiety    Aortic ectasia, abdominal    a.) CT abd 01/01/2022: 2.9 cm infrarenal abdominal aorta   Aortic stenosis    Asthma    Bladder cancer (HCC) 2023   BPH (benign prostatic hyperplasia) 09/06/2007   CAD s/p CABG    a.) 1990 s/p MI--> CABG x 3; b.) LHC/PCI 2002 --> BMS (unk type) to LCx   Carotid arterial disease    a.) 08/2016 Carotid U/S: <39% bilat; b.) carotid doppler 10/12/2022: mild-to-moderate (<50%) bilateral bifurcation plaque.   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 06/2019 Echo (in setting of  COVID): EF 35-40%, mild LVH, g1 DD, glob HK; b. 09/2019 Echo: EF 50-55%, Gr1 DD; c. 03/2021 Echo: EF 50-55%, no rwma, mild LVH, GrI DD, nl RV fxn. RVSP 44.31mmHg. Mild-mod dil LA. Triv MR. Mild-mod Ao sclerosis.   History of 2019 novel coronavirus disease (COVID-19) 05/20/2019   History of hiatal hernia    History of shingles    HLD (hyperlipidemia) 04/26/2007   HTN (hypertension) 04/30/2007   LBBB (left bundle branch block)    Long-term use of aspirin  therapy    Lumbar disc disease with  radiculopathy    Migraines    Myocardial infarction (HCC) 1990   a.) resulted in 3v CABG   Myocardial infarction (HCC) 2002   a.) second cardiac event; PCI with placement of BMS (unknown type) to LCx   Neuropathy of both feet    OSA on CPAP    Osteoarthritis    Oxygen  deficiency    PAD (peripheral artery disease)    a. 07/2017 LE duplex: RSFA 75-13m, LSFA 75-59m, 50-74d; c. 08/2018 Periph Angio: No signif AoIliac dzs. Mod-sev Ca2+ RSFA w/ diff dzs throughout- 3 vessel runoff. Borderline signif LSFA dzs w/ mod-sev Ca2+ vessels and 3 vessel runoff below the knee-->Med rx.   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   Right mid dorsum forearm - in situ - EDC   Stroke (HCC)    T2DM (type 2 diabetes mellitus) (HCC)    Traumatic open wound of lower leg with delayed healing, left 12/28/2022   Past Surgical History:  Procedure Laterality Date   ABDOMINAL AORTOGRAM W/LOWER EXTREMITY [name] 09/12/2018   Procedure: ABDOMINAL AORTOGRAM W/LOWER EXTREMITY;  Surgeon: Darron Deatrice LABOR, MD;  Location: [facility];  Service: Cardiovascular;  Laterality: [name];   ANTERIOR CERVICAL DECOMP/DISCECTOMY FUSION [name] 07/11/2022   Anterior Cervical Decompression Fusion ,Interboy Prothesis,Plate/Screws , Cervical four-five,Cervical five-six Gavin, Reyes, MD)   APPENDECTOMY     rupture   BACK SURGERY     1984 and then another one [facility]   BLADDER INSTILLATION [name] 10/19/2021   Procedure: BLADDER INSTILLATION OF GEMCITABINE ;  Surgeon: Twylla Glendia BROCKS, MD;  Location: [facility];  Service: Urology;  Laterality: [name];   BLADDER INSTILLATION [name] 11/15/2022   Procedure: BLADDER  INSTILLATION OF GEMCITABINE ;  Surgeon: Twylla Glendia BROCKS, MD;  Location: [facility];  Service: Urology;  Laterality: [name];   CARDIAC CATHETERIZATION [name] 07/07/2015   Procedure: Left Heart Cath and Cors/Grafts Angiography;  Surgeon: Evalene JINNY Lunger, MD;  Location: [facility];  Service: Cardiovascular;  Laterality: [name];   CATARACT EXTRACTION, BILATERAL  2009   CERVICAL SPINE SURGERY  12/2016   cervical stenosis Gavin)   COLONOSCOPY  03/2010   HP polyp, diverticulosis, rpt 10 yrs ([name])   CORONARY ANGIOPLASTY  11/30/2000   LCX   CORONARY ARTERY BYPASS GRAFT  1990   3 vessel    CORONARY STENT INTERVENTION [name] 01/13/2023   Procedure: CORONARY STENT INTERVENTION;  Surgeon: Darron Deatrice LABOR, MD;  Location: [facility];  Service: Cardiovascular;  Laterality: [name];   CYSTOSCOPY  12/23/2010   [facility]  CYSTOSCOPY WITH BIOPSY [name] 01/16/2024   Procedure: CYSTOSCOPY, WITH BIOPSY;  Surgeon: Twylla Glendia BROCKS, MD;  Location: [facility];  Service: Urology;  Laterality: [name];   EYE SURGERY     JOINT REPLACEMENT     KNEE ARTHROSCOPY Right    x2   LAMINOTOMY  1986   L5/S1 lumbar laminotomy for two ruptured discs/fusion   LEFT HEART CATH AND CORS/GRAFTS ANGIOGRAPHY [name] 01/19/2024   Procedure: LEFT HEART CATH AND CORS/GRAFTS ANGIOGRAPHY;  Surgeon: Mady Bruckner, MD;  Location: [facility];  Service: Cardiovascular;  Laterality: [name];   LUMBAR LAMINECTOMY/DECOMPRESSION MICRODISCECTOMY [name] 07/13/2016   Procedure: LUMBAR TWO-THREE, LUMBAR THREE-FOUR, LUMBAR FOUR-FIVE LAMINECTOMY AND FORAMINOTOMY;  Surgeon: Reyes Budge, MD;  Location: [facility];  Service: Neurosurgery;  Laterality: [name];  LAMINECTOMY AND FORAMINOTOMY L2-L3, L3-L4,L4-L5   LUMBAR SPINE SURGERY  06/2020   Bilateral redo laminectomy/laminotomy/foraminotomies/medial facetectomy to decompress the bilateral L4 and L5 nerve roots Gavin)   REVERSE SHOULDER ARTHROPLASTY Right 12/21/2023   Procedure: ARTHROPLASTY, SHOULDER,  TOTAL, REVERSE;  Surgeon: Melita Drivers, MD;  Location: [facility];  Service: Orthopedics;  Laterality: Right;   RIGHT/LEFT HEART CATH AND CORONARY ANGIOGRAPHY Bilateral 01/13/2023   Procedure: RIGHT/LEFT HEART CATH AND CORONARY ANGIOGRAPHY;  Surgeon: Darron Deatrice LABOR, MD;  Location: [facility];  Service: Cardiovascular;  Laterality: Bilateral;   SPINE SURGERY     TOTAL HIP ARTHROPLASTY Bilateral 1999,2000   TOTAL KNEE ARTHROPLASTY Right 03/06/2017   Procedure: RIGHT TOTAL KNEE ARTHROPLASTY;  Surgeon: Melodi Lerner, MD;  Location: [facility];  Service: Orthopedics;  Laterality: Right;   TRANSURETHRAL RESECTION OF BLADDER TUMOR [name] 10/19/2021   Procedure: TRANSURETHRAL RESECTION OF BLADDER TUMOR (TURBT);  Surgeon: Twylla Glendia BROCKS, MD;  Location: [facility];  Service: Urology;  Laterality: [name];   TRANSURETHRAL RESECTION OF BLADDER TUMOR [name] 11/15/2022   Procedure: TRANSURETHRAL RESECTION OF BLADDER TUMOR (TURBT);  Surgeon: Twylla Glendia BROCKS, MD;  Location: [facility];  Service: Urology;  Laterality: [name];    Allergies  Allergies[1]  Home Medications    Prior to Admission medications  Medication Sig Start Date End Date Taking? Authorizing Provider  albuterol  (ACCUNEB ) 0.63 MG/3ML nebulizer solution Take 3 mLs (0.63 mg total) by nebulization every 6 (six) hours as needed for wheezing. 08/12/24   Dunlap, Summer, PA-C  aspirin  EC 81 MG EC tablet Take 1 tablet (81 mg total) by mouth daily. 06/26/19   Elgergawy, Brayton RAMAN, MD  atorvastatin  (LIPITOR) 20 MG tablet Take 1 tablet (20 mg total) by mouth daily. 07/01/24   Gollan, Timothy J, MD  Cyanocobalamin  (VITAMIN B-12 ER PO) Take 1 capsule by mouth daily.    [provider]  docusate sodium  (COLACE) 100 MG capsule Take 1 capsule (100 mg total) by mouth daily as needed for mild constipation. 03/07/23   Rilla Baller, MD  ezetimibe  (ZETIA ) 10 MG tablet TAKE 1 TABLET BY MOUTH ONCE DAILY 02/06/24   Gollan, Timothy J, MD  famotidine  (PEPCID ) 20  MG tablet Take 20 mg by mouth daily.    [provider]  fluorouracil  (EFUDEX ) 5 % cream Apply to nose twice a day x 7 days 02/06/24   Hester Alm BROCKS, MD  fluticasone  (FLONASE ) 50 MCG/ACT nasal spray Place 1 spray into both nostrils daily as needed for allergies or rhinitis.    [provider]  Fluticasone -Umeclidin-Vilant (TRELEGY ELLIPTA ) 200-62.5-25 MCG/ACT AEPB Inhale 1 puff into the lungs daily. 09/05/23   Tamea Dedra CROME, MD  furosemide  (LASIX ) 40 MG tablet Take 0.5 tablets (  20 mg total) by mouth daily as needed. Take 1 tablet (40 mg) daily as need for weight greater then 208. 11/16/23   Hammock, Sheri, NP  gabapentin  (NEURONTIN ) 600 MG tablet Take 0.5 tablets (300 mg total) by mouth 2 (two) times daily AND 1 tablet (600 mg total) at bedtime. 03/29/24   Rilla Baller, MD  guaiFENesin -codeine  100-10 MG/5ML syrup Take 5 mLs by mouth 3 (three) times daily as needed for cough. 08/18/24   Fernand Rossie HERO, MD  HYDROcodone -acetaminophen  (NORCO/VICODIN) 5-325 MG tablet Take 1-2 tablets by mouth every 6 (six) hours as needed. 04/02/24   [provider]  hydrocortisone  2.5 % cream Apply to chin and beside the nose at bedtime Tues, Thurs, Sat 02/06/24   Hester Alm BROCKS, MD  isosorbide  mononitrate (IMDUR ) 30 MG 24 hr tablet TAKE 1 TABLET BY MOUTH DAILY. 12/19/23   Darron Deatrice LABOR, MD  ketoconazole  (NIZORAL ) 2 % cream Apply to chin and beside the nose at bedtime Mon, Wed, Fri 02/06/24   Hester Alm BROCKS, MD  losartan  (COZAAR ) 25 MG tablet Take 1 tablet (25 mg total) by mouth daily. 10/06/23 08/26/24  Gerard Frederick, NP  methylPREDNISolone  (MEDROL  DOSEPAK) 4 MG TBPK tablet Take as directed in the package. 08/21/24   Tamea Dedra CROME, MD  methylPREDNISolone  (MEDROL ) 4 MG tablet Take 1 tablet (4 mg total) by mouth daily for 7 days. 08/26/24 09/02/24  Tamea Dedra CROME, MD  metoprolol  succinate (TOPROL  XL) 25 MG 24 hr tablet Take 0.5 tablets (12.5 mg total) by mouth daily. 01/30/24  01/29/25  Lorene Sinclair L, PA-C  montelukast  (SINGULAIR ) 10 MG tablet TAKE 1 TABLET BY MOUTH DAILY 08/16/24   Tamea Dedra CROME, MD  mupirocin  ointment (BACTROBAN ) 2 % Apply 1 Application topically 2 (two) times daily. 01/10/23   Rilla Baller, MD  nitroGLYCERIN  (NITROSTAT ) 0.4 MG SL tablet DISSOLVE ONE TABLET UNDER THE TONGUE AS NEEDED FOR CHEST PAIN. MAY REPEAT AS INDICATED BY YOUR DOCTOR. 08/26/24   Rilla Baller, MD  ondansetron  (ZOFRAN ) 8 MG tablet Take 1 tablet (8 mg total) by mouth every 8 (eight) hours as needed for nausea or vomiting. 03/07/24   Tina Pauletta BROCKS, MD  phenazopyridine  (PYRIDIUM ) 200 MG tablet Take 1 tablet (200 mg total) by mouth 3 (three) times daily as needed for pain. One on day before treatment, then as needed 03/07/24   Tina Pauletta BROCKS, MD  polyethylene glycol (MIRALAX  / GLYCOLAX ) 17 g packet Take 17 g by mouth daily as needed for mild constipation.     [provider]  prochlorperazine  (COMPAZINE ) 10 MG tablet Take 1 tablet (10 mg total) by mouth every 6 (six) hours as needed for nausea or vomiting. 03/07/24   Tina Pauletta BROCKS, MD  ranolazine  (RANEXA ) 500 MG 12 hr tablet TAKE ONE (1) TABLET BY MOUTH TWO TIMES PER DAY 01/11/24   Gollan, Timothy J, MD  sodium bicarbonate  650 MG tablet Take 2 tabs (1300 mg) of sodium bicarbonate  (two 650 mg tabs) the night before and morning of therapy 06/21/24   Tina Pauletta BROCKS, MD  spironolactone  (ALDACTONE ) 25 MG tablet TAKE ONE-HALF (1/2) TABLET (12.5 MG) BY MOUTH ONCE DAILY 07/01/24   Furth, Cadence H, PA-C  tamsulosin  (FLOMAX ) 0.4 MG CAPS capsule TAKE 1 CAPSULE BY MOUTH AT BEDTIME 06/11/24   Stoioff, Glendia BROCKS, MD  Vibegron  (GEMTESA ) 75 MG TABS Take 1 tablet (75 mg total) by mouth daily. 06/11/24   Twylla Glendia BROCKS, MD    Physical Exam    Vital  Signs:  [name] does not have vital signs available for review today.  Given telephonic nature of communication, physical exam is limited. AAOx3. NAD. Normal affect.  Speech  and respirations are unlabored.  Accessory Clinical Findings    None  Assessment & Plan    1.  Preoperative Cardiovascular Risk Assessment: According to the Revised Cardiac Risk Index (RCRI), his   His   according to the Duke Activity Status Index (DASI).   The patient was advised that if he develops new symptoms prior to surgery to contact our office to arrange for a follow-up visit, and he verbalized understanding.  Ideally aspirin  should be continued without interruption, however if the bleeding risk is too great, aspirin  may be held for 5-7 days prior to surgery. Please resume aspirin  post operatively when it is felt to be safe from a bleeding standpoint.   A copy of this note will be routed to requesting surgeon.  Time:   Today, I have spent *** minutes with the patient with telehealth technology discussing medical history, symptoms, and management plan.     [name], NP-C  08/27/2024, 7:53 AM [address], [address] [place], [place] [address] Office [phone] Fax [phone]      [1]  Allergies Allergen Reactions   Vioxx [Rofecoxib] Other (See Comments)    Hemorrhage    Oxycodone  Other (See Comments)    Could not wake up , felt drunk   Spiriva  Respimat [Tiotropium Bromide] Other (See Comments)    Elevated bp   Contrast Media [Iodinated Contrast Media] Itching and Rash    Delayed reaction post abdominal aortagram.    Morphine  Nausea Only and Other (See Comments)    Irritability  morphine    "

## 2024-08-27 NOTE — Progress Notes (Signed)
 This encounter was created in error - please disregard.

## 2024-08-27 NOTE — Telephone Encounter (Signed)
 Spoke with patient who states he is postponing surgery due to having flu. Advised that once surgery is rescheduled, we will schedule his virtual visit.   Preop callback, please cancel patient's appointment for today.   [name], NP-C  08/27/2024, 9:47 AM [address], [address] [place], [place] [address] Office [phone] Fax [phone]

## 2024-08-28 ENCOUNTER — Other Ambulatory Visit: Payer: Self-pay | Admitting: Cardiology

## 2024-08-28 ENCOUNTER — Encounter: Payer: Self-pay | Admitting: Urology

## 2024-08-29 ENCOUNTER — Other Ambulatory Visit: Payer: Self-pay | Admitting: Pulmonary Disease

## 2024-08-29 ENCOUNTER — Other Ambulatory Visit: Payer: Self-pay

## 2024-08-29 MED FILL — Fluticasone-Umeclidinium-Vilanterol AEPB 200-62.5-25 MCG/ACT: RESPIRATORY_TRACT | 90 days supply | Qty: 180 | Fill #0 | Status: CN

## 2024-08-29 NOTE — Telephone Encounter (Signed)
 Okay to refill

## 2024-08-29 NOTE — Telephone Encounter (Signed)
 In accordance with refill protocols, please review and address the following requirements before this medication refill can be authorized:  Labs

## 2024-09-02 ENCOUNTER — Ambulatory Visit: Payer: Self-pay | Admitting: Pulmonary Disease

## 2024-09-03 ENCOUNTER — Encounter: Payer: Self-pay | Admitting: Dermatology

## 2024-09-03 ENCOUNTER — Ambulatory Visit: Admitting: Dermatology

## 2024-09-03 DIAGNOSIS — W908XXA Exposure to other nonionizing radiation, initial encounter: Secondary | ICD-10-CM | POA: Diagnosis not present

## 2024-09-03 DIAGNOSIS — L578 Other skin changes due to chronic exposure to nonionizing radiation: Secondary | ICD-10-CM | POA: Diagnosis not present

## 2024-09-03 DIAGNOSIS — Z5111 Encounter for antineoplastic chemotherapy: Secondary | ICD-10-CM

## 2024-09-03 DIAGNOSIS — Z7189 Other specified counseling: Secondary | ICD-10-CM

## 2024-09-03 DIAGNOSIS — L570 Actinic keratosis: Secondary | ICD-10-CM

## 2024-09-03 DIAGNOSIS — D1801 Hemangioma of skin and subcutaneous tissue: Secondary | ICD-10-CM

## 2024-09-03 DIAGNOSIS — Z79899 Other long term (current) drug therapy: Secondary | ICD-10-CM

## 2024-09-03 DIAGNOSIS — D692 Other nonthrombocytopenic purpura: Secondary | ICD-10-CM | POA: Diagnosis not present

## 2024-09-03 DIAGNOSIS — Z86007 Personal history of in-situ neoplasm of skin: Secondary | ICD-10-CM | POA: Diagnosis not present

## 2024-09-03 DIAGNOSIS — L814 Other melanin hyperpigmentation: Secondary | ICD-10-CM

## 2024-09-03 DIAGNOSIS — Z8589 Personal history of malignant neoplasm of other organs and systems: Secondary | ICD-10-CM

## 2024-09-03 DIAGNOSIS — Z1283 Encounter for screening for malignant neoplasm of skin: Secondary | ICD-10-CM | POA: Diagnosis not present

## 2024-09-03 DIAGNOSIS — L820 Inflamed seborrheic keratosis: Secondary | ICD-10-CM | POA: Diagnosis not present

## 2024-09-03 DIAGNOSIS — L821 Other seborrheic keratosis: Secondary | ICD-10-CM

## 2024-09-03 NOTE — Progress Notes (Signed)
 "  Follow-Up Visit   Subjective  [name] is a 84 y.o. male who presents for the following: Skin Cancer Screening and Upper Body Skin Exam hx of SCC IS, Aks, pt used 5FU/Calcipotriene to nose 03/2024 with good reaction, check rough spot R wrist, ~79m  The patient presents for Upper Body Skin Exam (UBSE) for skin cancer screening and mole check. The patient has spots, moles and lesions to be evaluated, some may be new or changing and the patient may have concern these could be cancer.  The following portions of the chart were reviewed this encounter and updated as appropriate: medications, allergies, medical history  Review of Systems:  No other skin or systemic complaints except as noted in HPI or Assessment and Plan.  Objective  Well appearing patient in no apparent distress; mood and affect are within normal limits.  All skin waist up examined. Relevant physical exam findings are noted in the Assessment and Plan.  R dorsum hand x 1, R ant shoulder x 4 (5) Stuck on waxy paps with erythema R medial infraorbital, face, R ear x 5 (5) Pink scaly macules   Assessment & Plan   INFLAMED SEBORRHEIC KERATOSIS (5) R dorsum hand x 1, R ant shoulder x 4 (5) Symptomatic, irritating, patient would like treated. - Destruction of lesion - R dorsum hand x 1, R ant shoulder x 4 (5) Complexity: simple   Destruction method: cryotherapy   Informed consent: discussed and consent obtained   Timeout:  patient name, date of birth, surgical site, and procedure verified Lesion destroyed using liquid nitrogen: Yes   Region frozen until ice ball extended beyond lesion: Yes   Outcome: patient tolerated procedure well with no complications   Post-procedure details: wound care instructions given    AK (ACTINIC KERATOSIS) (5) R medial infraorbital, face, R ear x 5 (5) Actinic keratoses are precancerous spots that appear secondary to cumulative UV radiation exposure/sun exposure over time. They are  chronic with expected duration over 1 year. A portion of actinic keratoses will progress to squamous cell carcinoma of the skin. It is not possible to reliably predict which spots will progress to skin cancer and so treatment is recommended to prevent development of skin cancer.  Recommend daily broad spectrum sunscreen SPF 30+ to sun-exposed areas, reapply every 2 hours as needed.  Recommend staying in the shade or wearing long sleeves, sun glasses (UVA+UVB protection) and wide brim hats (4-inch brim around the entire circumference of the hat). Call for new or changing lesions. - Destruction of lesion - R medial infraorbital, face, R ear x 5 (5) Complexity: simple   Destruction method: cryotherapy   Informed consent: discussed and consent obtained   Timeout:  patient name, date of birth, surgical site, and procedure verified Lesion destroyed using liquid nitrogen: Yes   Region frozen until ice ball extended beyond lesion: Yes   Outcome: patient tolerated procedure well with no complications   Post-procedure details: wound care instructions given      Skin cancer screening performed today.  ACTINIC DAMAGE WITH PRECANCEROUS ACTINIC KERATOSES Counseling for Topical Chemotherapy Management: Patient exhibits: - Severe, confluent actinic changes with pre-cancerous actinic keratoses that is secondary to cumulative UV radiation exposure over time - Condition that is severe; chronic, not at goal. - diffuse scaly erythematous macules and papules with underlying dyspigmentation - Discussed Prescription Field Treatment topical Chemotherapy for Severe, Chronic Confluent Actinic Changes with Pre-Cancerous Actinic Keratoses Field treatment involves treatment of an entire area of  skin that has confluent Actinic Changes (Sun/ Ultraviolet light damage) and PreCancerous Actinic Keratoses by method of PhotoDynamic Therapy (PDT) and/or prescription Topical Chemotherapy agents such as 5-fluorouracil ,  5-fluorouracil /calcipotriene, and/or imiquimod.  The purpose is to decrease the number of clinically evident and subclinical PreCancerous lesions to prevent progression to development of skin cancer by chemically destroying early precancer changes that may or may not be visible.  It has been shown to reduce the risk of developing skin cancer in the treated area. As a result of treatment, redness, scaling, crusting, and open sores may occur during treatment course. One or more than one of these methods may be used and may have to be used several times to control, suppress and eliminate the PreCancerous changes. Discussed treatment course, expected reaction, and possible side effects. - Recommend daily broad spectrum sunscreen SPF 30+ to sun-exposed areas, reapply every 2 hours as needed.  - Staying in the shade or wearing long sleeves, sun glasses (UVA+UVB protection) and wide brim hats (4-inch brim around the entire circumference of the hat) are also recommended. - Call for new or changing lesions.  At the end of February - Start 5-fluorouracil /calcipotriene cream twice a day for 7 days to affected areas including R medial infraorbital area.  Patient has refills at [facility].  Patient provided with handout reviewing treatment course and side effects and advised to call or message us  on MyChart with any concerns.  Reviewed course of treatment and expected reaction.  Patient advised to expect inflammation and crusting and advised that erosions are possible.  Patient advised to be diligent with sun protection during and after treatment. Counseled to keep medication out of reach of children and pets.   Lentigines, Seborrheic Keratoses, Hemangiomas - Benign normal skin lesions - Benign-appearing - Call for any changes  Melanocytic Nevi - Tan-brown and/or pink-flesh-colored symmetric macules and papules - Benign appearing on exam today - Observation - Call clinic for new or changing moles -  Recommend daily use of broad spectrum spf 30+ sunscreen to sun-exposed areas.   HISTORY OF SQUAMOUS CELL CARCINOMA IN SITU OF THE SKIN - No evidence of recurrence today - Recommend regular full body skin exams - Recommend daily broad spectrum sunscreen SPF 30+ to sun-exposed areas, reapply every 2 hours as needed.  - Call if any new or changing lesions are noted between office visits  - R mid dorsum forearm  Purpura - Chronic; persistent and recurrent.  Treatable, but not curable. - Violaceous macules and patches - Benign - Related to trauma, age, sun damage and/or use of blood thinners, chronic use of topical and/or oral steroids - Observe - Can use OTC arnica containing moisturizer such as Dermend Bruise Formula if desired - Call for worsening or other concerns   Return in about 6 months (around 03/03/2025) for AK f/u.  I, Grayce Saunas, RMA, am acting as scribe for Alm Rhyme, MD .   Documentation: I have reviewed the above documentation for accuracy and completeness, and I agree with the above.  Alm Rhyme, MD    "

## 2024-09-03 NOTE — Patient Instructions (Addendum)
 Starting February 28, Start Fluorouracil  / Calcipotriene cream twice a day for 7 days to area under right eye   Instructions for [facility] Medications you have 2 refills on file  One or more of your medications was sent to the [facility] mail order compounding pharmacy. You will receive an email from them and can purchase the medicine through that link. It will then be mailed to your home at the address you confirmed. If for any reason you do not receive an email from them, please check your spam folder. If you still do not find the email, please let us  know. [facility] phone number is [phone].   Cryotherapy Aftercare  Wash gently with soap and water  everyday.   Apply Vaseline and Band-Aid daily until healed.    Due to recent changes in healthcare laws, you may see results of your pathology and/or laboratory studies on MyChart before the doctors have had a chance to review them. We understand that in some cases there may be results that are confusing or concerning to you. Please understand that not all results are received at the same time and often the doctors may need to interpret multiple results in order to provide you with the best plan of care or course of treatment. Therefore, we ask that you please give us  2 business days to thoroughly review all your results before contacting the office for clarification. Should we see a critical lab result, you will be contacted sooner.   If You Need Anything After Your Visit  If you have any questions or concerns for your doctor, please call our main line at [phone] and press option 4 to reach your doctor's medical assistant. If no one answers, please leave a voicemail as directed and we will return your call as soon as possible. Messages left after 4 pm will be answered the following business day.   You may also send us  a message via MyChart. We typically respond to MyChart messages within 1-2 business days.  For  prescription refills, please ask your pharmacy to contact our office. Our fax number is [phone].  If you have an urgent issue when the clinic is closed that cannot wait until the next business day, you can page your doctor at the number below.    Please note that while we do our best to be available for urgent issues outside of office hours, we are not available 24/7.   If you have an urgent issue and are unable to reach us , you may choose to seek medical care at your doctor's office, retail clinic, urgent care center, or emergency room.  If you have a medical emergency, please immediately call 911 or go to the emergency department.  Pager Numbers  - Dr. Hester: [phone]  - Dr. Jackquline: [phone]  - Dr. Claudene: [phone]   - Dr. Raymund: [phone]  In the event of inclement weather, please call our main line at [phone] for an update on the status of any delays or closures.  Dermatology Medication Tips: Please keep the boxes that topical medications come in in order to help keep track of the instructions about where and how to use these. Pharmacies typically print the medication instructions only on the boxes and not directly on the medication tubes.   If your medication is too expensive, please contact our office at [phone] option 4 or send us  a message through MyChart.   We are unable to tell what your co-pay for medications will be in  advance as this is different depending on your insurance coverage. However, we may be able to find a substitute medication at lower cost or fill out paperwork to get insurance to cover a needed medication.   If a prior authorization is required to get your medication covered by your insurance company, please allow us  1-2 business days to complete this process.  Drug prices often vary depending on where the prescription is filled and some pharmacies may offer cheaper prices.  The website www.goodrx.com contains coupons for  medications through different pharmacies. The prices here do not account for what the cost may be with help from insurance (it may be cheaper with your insurance), but the website can give you the price if you did not use any insurance.  - You can print the associated coupon and take it with your prescription to the pharmacy.  - You may also stop by our office during regular business hours and pick up a GoodRx coupon card.  - If you need your prescription sent electronically to a different pharmacy, notify our office through [facility] or by phone at [phone] option 4.     Si Usted Necesita Algo Despus de Su Visita  Tambin puede enviarnos un mensaje a travs de Clinical Cytogeneticist. Por lo general respondemos a los mensajes de MyChart en el transcurso de 1 a 2 das hbiles.  Para renovar recetas, por favor pida a su farmacia que se ponga en contacto con nuestra oficina. Randi lakes de fax es [place] [phone].  Si tiene un asunto urgente cuando la clnica est cerrada y que no puede esperar hasta el siguiente da hbil, puede llamar/localizar a su doctor(a) al nmero que aparece a continuacin.   Por favor, tenga en cuenta que aunque hacemos todo lo posible para estar disponibles para asuntos urgentes fuera del horario de [place], no estamos disponibles las 24 horas del da, los 7 [address]  [address] de la [place].   Si tiene un problema urgente y no puede comunicarse con nosotros, puede optar por buscar atencin mdica  en el consultorio de su doctor(a), en una clnica privada, en un centro de atencin urgente o en una sala de emergencias.  Si tiene engineer, drilling, por favor llame inmediatamente al 911 o vaya a la sala de emergencias.  Nmeros de bper  - Dr. Hester: [phone]  - Dra. Jackquline: [phone]  - Dr. Claudene: [phone]  - Dra. Kitts: [phone]  En caso de inclemencias del [place], por favor llame a nuestra lnea principal al [phone] para una actualizacin sobre el  estado de cualquier retraso o cierre.  Consejos para la medicacin en dermatologa: Por favor, guarde las cajas en las que vienen los medicamentos de uso tpico para ayudarle a seguir las instrucciones sobre dnde y cmo usarlos. Las farmacias generalmente imprimen las instrucciones del medicamento slo en las cajas y no directamente en los tubos del [place].   Si su medicamento es muy caro, por favor, pngase en contacto con landry rieger llamando al [phone] y presione la opcin 4 o envenos un mensaje a travs de Clinical Cytogeneticist.   No podemos decirle cul ser su copago por los medicamentos por adelantado ya que esto es diferente dependiendo de la cobertura de su seguro. Sin embargo, es posible que podamos encontrar un medicamento sustituto a audiological scientist un formulario para que el seguro cubra el medicamento que se considera necesario.   Si se requiere una autorizacin previa para que su compaa de seguros cubra su medicamento, por favor  permtanos de 1 a 2 das hbiles para completar [address]  Los precios de los medicamentos varan con frecuencia dependiendo del environmental consultant de dnde se surte la receta y alguna farmacias pueden ofrecer precios ms baratos.  El sitio web www.goodrx.com tiene cupones para medicamentos de health and safety inspector. Los precios aqu no tienen en cuenta lo que podra costar con la ayuda del seguro (puede ser ms barato con su seguro), pero el sitio web puede darle el precio si no utiliz tourist information centre manager.  - Puede imprimir el cupn correspondiente y llevarlo con su receta a la farmacia.  - Tambin puede pasar por nuestra oficina durante el horario de atencin regular y education officer, museum una tarjeta de cupones de GoodRx.  - Si necesita que su receta se enve electrnicamente a una farmacia diferente, informe a nuestra oficina a travs de MyChart de [place] o por telfono llamando al [phone] y presione la opcin 4.

## 2024-09-04 ENCOUNTER — Other Ambulatory Visit: Payer: Self-pay

## 2024-09-05 ENCOUNTER — Other Ambulatory Visit: Payer: Self-pay | Admitting: Cardiovascular Disease

## 2024-09-12 ENCOUNTER — Ambulatory Visit: Payer: Self-pay

## 2024-09-12 NOTE — Telephone Encounter (Signed)
 Noted.

## 2024-09-12 NOTE — Telephone Encounter (Signed)
 FYI Only or Action Required?: FYI only for provider: appointment scheduled on 09/13/24.  Patient was last seen in primary care on 03/29/2024 by Rilla Baller, MD.  Called Nurse Triage reporting Blood Sugar Problem.  Symptoms began several days ago.  Interventions attempted: Dietary changes.  Symptoms are: unchanged.  Triage Disposition: See Physician Within 24 Hours  Patient/caregiver understands and will follow disposition?: Yes Reason for Disposition  [1] Symptoms of high blood sugar (e.g., increased thirst, frequent urination, weight loss) AND [2] not able to test blood glucose  Answer Assessment - Initial Assessment Questions Pt reports that his BS has been elevated at night, averaging 250. Patient just ended course of prednisone  one week ago. This AM, his BS was 122 fasted. States has felt fatigue x 2 days.   Denies polyuria above baseline on lasix . Denies N/V/Abdominal pain.   1. BLOOD GLUCOSE: What is your blood glucose level?      122 this morning 2. ONSET: When did you check the blood glucose?     This morning 3. USUAL RANGE: What is your glucose level usually? (e.g., usual fasting morning value, usual evening value)     <135 4. KETONES: Do you check for ketones (urine or blood test strips)? If Yes, ask: What does the test show now?      NA 5. TYPE 1 or 2:  Do you know what type of diabetes you have?  (e.g., Type 1, Type 2, Gestational; doesn't know)      Prediabetic 6. INSULIN : Do you take insulin ? What type of insulin (s) do you use? What is the mode of delivery? (syringe, pen; injection or pump)?      Denies 7. DIABETES PILLS: Do you take any pills for your diabetes? If Yes, ask: Have you missed taking any pills recently?     Denies 8. OTHER SYMPTOMS: Do you have any symptoms? (e.g., fever, frequent urination, difficulty breathing, dizziness, weakness, vomiting)     Fatigue  Protocols used: Diabetes - High Blood Sugar-A-AH

## 2024-09-13 ENCOUNTER — Inpatient Hospital Stay

## 2024-09-13 ENCOUNTER — Encounter: Payer: Self-pay | Admitting: Family Medicine

## 2024-09-13 ENCOUNTER — Ambulatory Visit: Payer: Self-pay | Admitting: Family Medicine

## 2024-09-13 ENCOUNTER — Ambulatory Visit: Admitting: Family Medicine

## 2024-09-13 VITALS — BP 128/60 | HR 77 | Temp 97.3°F | Ht 72.0 in | Wt 218.4 lb

## 2024-09-13 DIAGNOSIS — E1169 Type 2 diabetes mellitus with other specified complication: Secondary | ICD-10-CM | POA: Diagnosis not present

## 2024-09-13 DIAGNOSIS — R5383 Other fatigue: Secondary | ICD-10-CM | POA: Diagnosis not present

## 2024-09-13 LAB — COMPREHENSIVE METABOLIC PANEL WITH GFR
ALT: 14 U/L (ref 3–53)
AST: 16 U/L (ref 5–37)
Albumin: 4.1 g/dL (ref 3.5–5.2)
Alkaline Phosphatase: 31 U/L — ABNORMAL LOW (ref 39–117)
BUN: 30 mg/dL — ABNORMAL HIGH (ref 6–23)
CO2: 25 meq/L (ref 19–32)
Calcium: 8.8 mg/dL (ref 8.4–10.5)
Chloride: 104 meq/L (ref 96–112)
Creatinine, Ser: 1.46 mg/dL (ref 0.40–1.50)
GFR: 44.21 mL/min — ABNORMAL LOW
Glucose, Bld: 114 mg/dL — ABNORMAL HIGH (ref 70–99)
Potassium: 4.5 meq/L (ref 3.5–5.1)
Sodium: 138 meq/L (ref 135–145)
Total Bilirubin: 0.6 mg/dL (ref 0.2–1.2)
Total Protein: 6 g/dL (ref 6.0–8.3)

## 2024-09-13 LAB — POCT GLYCOSYLATED HEMOGLOBIN (HGB A1C): Hemoglobin A1C: 6.9 % — AB (ref 4.0–5.6)

## 2024-09-13 LAB — CBC WITH DIFFERENTIAL/PLATELET
Basophils Absolute: 0.1 10*3/uL (ref 0.0–0.1)
Basophils Relative: 1.2 % (ref 0.0–3.0)
Eosinophils Absolute: 0.2 10*3/uL (ref 0.0–0.7)
Eosinophils Relative: 3.3 % (ref 0.0–5.0)
HCT: 39.2 % (ref 39.0–52.0)
Hemoglobin: 13.2 g/dL (ref 13.0–17.0)
Lymphocytes Relative: 19.7 % (ref 12.0–46.0)
Lymphs Abs: 1 10*3/uL (ref 0.7–4.0)
MCHC: 33.7 g/dL (ref 30.0–36.0)
MCV: 94.7 fl (ref 78.0–100.0)
Monocytes Absolute: 0.5 10*3/uL (ref 0.1–1.0)
Monocytes Relative: 10.4 % (ref 3.0–12.0)
Neutro Abs: 3.4 10*3/uL (ref 1.4–7.7)
Neutrophils Relative %: 65.4 % (ref 43.0–77.0)
Platelets: 146 10*3/uL — ABNORMAL LOW (ref 150.0–400.0)
RBC: 4.14 Mil/uL — ABNORMAL LOW (ref 4.22–5.81)
RDW: 13.7 % (ref 11.5–15.5)
WBC: 5.2 10*3/uL (ref 4.0–10.5)

## 2024-09-13 LAB — TSH: TSH: 3.4 u[IU]/mL (ref 0.35–5.50)

## 2024-09-13 LAB — VITAMIN B12: Vitamin B-12: 456 pg/mL (ref 211–911)

## 2024-09-13 LAB — VITAMIN D 25 HYDROXY (VIT D DEFICIENCY, FRACTURES): VITD: 31.7 ng/mL (ref 30.00–100.00)

## 2024-09-13 NOTE — Assessment & Plan Note (Signed)
 Chronic with acute worsening Likely worsened given patient on prednisone  for about a month.  Some of this fatigue may also be due to elevated blood sugar or coming off extended prednisone .  Reviewed low-carb diet and need for regular physical activity.  Recommend following up with PCP in 3 months for repeat evaluation of A1c and I expected to be improved at that time.

## 2024-09-13 NOTE — Assessment & Plan Note (Signed)
 Acute, likely postinfectious fatigue given prolonged course. No red flags for new cardiac or pulmonary issue. Will evaluate labs for possible secondary cause. Fatigue likely in part due to recent infection, coming off prednisone  extended course, deconditioning and inactivity.

## 2024-09-13 NOTE — Progress Notes (Signed)
 "   Patient ID: [name], male    DOB: 1941/08/14, 84 y.o.   MRN: [id number]  This visit was conducted in person.  BP 128/60   Pulse 77   Temp (!) 97.3 F (36.3 C) (Temporal)   Ht 6' (1.829 m)   Wt 218 lb 6 oz (99.1 kg)   SpO2 94%   BMI 29.62 kg/m    CC:  Chief Complaint  Patient presents with   Hyperglycemia    Had readings of 250 x 2 days Just completed long course of prednisone    Fatigue    Subjective:   HPI: [name] is a 84 y.o. male presenting on 09/13/2024 for Hyperglycemia (Had readings of 250 x 2 days/Just completed long course of prednisone ) and Fatigue  PCP Rilla     Diabetes, type 2 .. Previously diet controlled.  He has been on prednisone  x 2 course since December for flu/COPD  Has been off in last week. He has been noting increases in CBGs in the last  2 days.SABRA in 250s... postprandial... has not been eating well.  This AM 132 Lab Results  Component Value Date   HGBA1C 6.9 (A) 09/13/2024    Breathing back to baseline.  He has been feeling more tired in last 2 days.. but this has been gpoing on a while.  5-6 hours of sleep at night.  Has sleep apnea.. on CPAP.  No depression.  Has been less active given recent illness.   Occ chest pain, no swelling in legs.. Recent EKG was stable.   past medical, surgical, family and social history reviewed and updated as indicated. Interim medical history since our last visit reviewed. Allergies and medications reviewed and updated. Outpatient Medications Prior to Visit  Medication Sig Dispense Refill   albuterol  (ACCUNEB ) 0.63 MG/3ML nebulizer solution Take 3 mLs (0.63 mg total) by nebulization every 6 (six) hours as needed for wheezing. 75 mL 2   aspirin  EC 81 MG EC tablet Take 1 tablet (81 mg total) by mouth daily. 30 tablet 0   atorvastatin  (LIPITOR) 20 MG tablet Take 1 tablet (20 mg total) by mouth daily. 90 tablet 3   Cyanocobalamin  (VITAMIN B-12 ER PO) Take 1 capsule by mouth daily.      docusate sodium  (COLACE) 100 MG capsule Take 1 capsule (100 mg total) by mouth daily as needed for mild constipation.     ezetimibe  (ZETIA ) 10 MG tablet TAKE 1 TABLET BY MOUTH ONCE DAILY 90 tablet 3   famotidine  (PEPCID ) 20 MG tablet Take 20 mg by mouth daily.     fluorouracil  (EFUDEX ) 5 % cream Apply to nose twice a day x 7 days 30 g 2   fluticasone  (FLONASE ) 50 MCG/ACT nasal spray Place 1 spray into both nostrils daily as needed for allergies or rhinitis.     Fluticasone -Umeclidin-Vilant (TRELEGY ELLIPTA ) 200-62.5-25 MCG/ACT AEPB Inhale 1 puff into the lungs daily. 180 each 3   furosemide  (LASIX ) 40 MG tablet Take 0.5 tablets (20 mg total) by mouth daily as needed. Take 1 tablet (40 mg) daily as need for weight greater then 208. 180 tablet 3   gabapentin  (NEURONTIN ) 600 MG tablet Take 0.5 tablets (300 mg total) by mouth 2 (two) times daily AND 1 tablet (600 mg total) at bedtime. 180 tablet 3   guaiFENesin -codeine  100-10 MG/5ML syrup Take 5 mLs by mouth 3 (three) times daily as needed for cough. 50 mL 0   HYDROcodone -acetaminophen  (NORCO/VICODIN) 5-325 MG tablet Take 1-2 tablets  by mouth every 6 (six) hours as needed.     hydrocortisone  2.5 % cream Apply to chin and beside the nose at bedtime Tues, Thurs, Sat 30 g 5   isosorbide  mononitrate (IMDUR ) 30 MG 24 hr tablet TAKE 1 TABLET BY MOUTH DAILY. 90 tablet 3   ketoconazole  (NIZORAL ) 2 % cream Apply to chin and beside the nose at bedtime Mon, Wed, Fri 15 g 0   losartan  (COZAAR ) 25 MG tablet Take 1 tablet (25 mg total) by mouth daily. 90 tablet 3   methylPREDNISolone  (MEDROL  DOSEPAK) 4 MG TBPK tablet Take as directed in the package. 21 tablet 0   metoprolol  succinate (TOPROL  XL) 25 MG 24 hr tablet Take 0.5 tablets (12.5 mg total) by mouth daily. 45 tablet 3   montelukast  (SINGULAIR ) 10 MG tablet TAKE 1 TABLET BY MOUTH DAILY 30 tablet 11   mupirocin  ointment (BACTROBAN ) 2 % Apply 1 Application topically 2 (two) times daily. 22 g 0   nitroGLYCERIN   (NITROSTAT ) 0.4 MG SL tablet DISSOLVE ONE TABLET UNDER THE TONGUE AS NEEDED FOR CHEST PAIN. MAY REPEAT AS INDICATED BY YOUR DOCTOR. 25 tablet 2   ondansetron  (ZOFRAN ) 8 MG tablet Take 1 tablet (8 mg total) by mouth every 8 (eight) hours as needed for nausea or vomiting. 30 tablet 1   phenazopyridine  (PYRIDIUM ) 200 MG tablet Take 1 tablet (200 mg total) by mouth 3 (three) times daily as needed for pain. One on day before treatment, then as needed 30 tablet 1   polyethylene glycol (MIRALAX  / GLYCOLAX ) 17 g packet Take 17 g by mouth daily as needed for mild constipation.      prochlorperazine  (COMPAZINE ) 10 MG tablet Take 1 tablet (10 mg total) by mouth every 6 (six) hours as needed for nausea or vomiting. 30 tablet 1   ranolazine  (RANEXA ) 500 MG 12 hr tablet TAKE ONE (1) TABLET BY MOUTH TWO TIMES PER DAY 180 tablet 3   sodium bicarbonate  650 MG tablet Take 2 tabs (1300 mg) of sodium bicarbonate  (two 650 mg tabs) the night before and morning of therapy 28 tablet 0   spironolactone  (ALDACTONE ) 25 MG tablet TAKE ONE-HALF (1/2) TABLET (12.5 MG) BY MOUTH ONCE DAILY 45 tablet 3   tamsulosin  (FLOMAX ) 0.4 MG CAPS capsule TAKE 1 CAPSULE BY MOUTH AT BEDTIME 90 capsule 1   Vibegron  (GEMTESA ) 75 MG TABS Take 1 tablet (75 mg total) by mouth daily. 30 tablet 11   No facility-administered medications prior to visit.     Per HPI unless specifically indicated in ROS section below Review of Systems  Constitutional:  Positive for fatigue. Negative for fever.  HENT:  Negative for ear pain.   Eyes:  Negative for pain.  Respiratory:  Negative for cough and shortness of breath.   Cardiovascular:  Negative for chest pain, palpitations and leg swelling.  Gastrointestinal:  Negative for abdominal pain.  Genitourinary:  Negative for dysuria.  Musculoskeletal:  Negative for arthralgias.  Neurological:  Negative for syncope, light-headedness and headaches.  Psychiatric/Behavioral:  Negative for dysphoric mood.     Objective:  BP 128/60   Pulse 77   Temp (!) 97.3 F (36.3 C) (Temporal)   Ht 6' (1.829 m)   Wt 218 lb 6 oz (99.1 kg)   SpO2 94%   BMI 29.62 kg/m   Wt Readings from Last 3 Encounters:  09/13/24 218 lb 6 oz (99.1 kg)  08/26/24 218 lb (98.9 kg)  08/23/24 218 lb 11.2 oz (99.2 kg)  Physical Exam Vitals reviewed.  Constitutional:      Appearance: He is well-developed.  HENT:     Head: Normocephalic.     Right Ear: Hearing normal.     Left Ear: Hearing normal.     Nose: Nose normal.  Neck:     Thyroid : No thyroid  mass or thyromegaly.     Vascular: No carotid bruit.     Trachea: Trachea normal.  Cardiovascular:     Rate and Rhythm: Normal rate and regular rhythm.     Pulses: Normal pulses.     Heart sounds: Heart sounds not distant. No murmur heard.    No friction rub. No gallop.     Comments: No peripheral edema Pulmonary:     Effort: Pulmonary effort is normal. No respiratory distress.     Breath sounds: Normal breath sounds.  Skin:    General: Skin is warm and dry.     Findings: No rash.  Psychiatric:        Speech: Speech normal.        Behavior: Behavior normal.        Thought Content: Thought content normal.       Results for orders placed or performed in visit on 09/13/24  POCT glycosylated hemoglobin (Hb A1C)   Collection Time: 09/13/24 11:28 AM  Result Value Ref Range   Hemoglobin A1C 6.9 (A) 4.0 - 5.6 %   HbA1c POC (<> result, manual entry)     HbA1c, POC (prediabetic range)     HbA1c, POC (controlled diabetic range)    Vitamin B12   Collection Time: 09/13/24 11:59 AM  Result Value Ref Range   Vitamin B-12 456 211 - 911 pg/mL  CBC with Differential/Platelet   Collection Time: 09/13/24 11:59 AM  Result Value Ref Range   WBC 5.2 4.0 - 10.5 K/uL   RBC 4.14 (L) 4.22 - 5.81 Mil/uL   Hemoglobin 13.2 13.0 - 17.0 g/dL   HCT 60.7 60.9 - 47.9 %   MCV 94.7 78.0 - 100.0 fl   MCHC 33.7 30.0 - 36.0 g/dL   RDW 86.2 88.4 - 84.4 %   Platelets 146.0 (L)  150.0 - 400.0 K/uL   Neutrophils Relative % 65.4 43.0 - 77.0 %   Lymphocytes Relative 19.7 12.0 - 46.0 %   Monocytes Relative 10.4 3.0 - 12.0 %   Eosinophils Relative 3.3 0.0 - 5.0 %   Basophils Relative 1.2 0.0 - 3.0 %   Neutro Abs 3.4 1.4 - 7.7 K/uL   Lymphs Abs 1.0 0.7 - 4.0 K/uL   Monocytes Absolute 0.5 0.1 - 1.0 K/uL   Eosinophils Absolute 0.2 0.0 - 0.7 K/uL   Basophils Absolute 0.1 0.0 - 0.1 K/uL  TSH   Collection Time: 09/13/24 11:59 AM  Result Value Ref Range   TSH 3.40 0.35 - 5.50 uIU/mL  Comprehensive metabolic panel with GFR   Collection Time: 09/13/24 11:59 AM  Result Value Ref Range   Sodium 138 135 - 145 mEq/L   Potassium 4.5 3.5 - 5.1 mEq/L   Chloride 104 96 - 112 mEq/L   CO2 25 19 - 32 mEq/L   Glucose, Bld 114 (H) 70 - 99 mg/dL   BUN 30 (H) 6 - 23 mg/dL   Creatinine, Ser 8.53 0.40 - 1.50 mg/dL   Total Bilirubin 0.6 0.2 - 1.2 mg/dL   Alkaline Phosphatase 31 (L) 39 - 117 U/L   AST 16 5 - 37 U/L   ALT 14 3 - 53  U/L   Total Protein 6.0 6.0 - 8.3 g/dL   Albumin  4.1 3.5 - 5.2 g/dL   GFR 55.78 (L) >39.99 mL/min   Calcium  8.8 8.4 - 10.5 mg/dL  VITAMIN D  25 Hydroxy (Vit-D Deficiency, Fractures)   Collection Time: 09/13/24 11:59 AM  Result Value Ref Range   VITD 31.70 30.00 - 100.00 ng/mL   *Note: Due to a large number of results and/or encounters for the requested time period, some results have not been displayed. A complete set of results can be found in Results Review.    Assessment and Plan  Type 2 diabetes mellitus with other specified complication, without long-term current use of insulin  (HCC) Assessment & Plan: Chronic with acute worsening Likely worsened given patient on prednisone  for about a month.  Some of this fatigue may also be due to elevated blood sugar or coming off extended prednisone .  Reviewed low-carb diet and need for regular physical activity.  Recommend following up with PCP in 3 months for repeat evaluation of A1c and I expected to be  improved at that time.  Orders: -     POCT glycosylated hemoglobin (Hb A1C)  Other fatigue Assessment & Plan: Acute, likely postinfectious fatigue given prolonged course. No red flags for new cardiac or pulmonary issue. Will evaluate labs for possible secondary cause. Fatigue likely in part due to recent infection, coming off prednisone  extended course, deconditioning and inactivity.  Orders: -     Vitamin B12 -     CBC with Differential/Platelet -     TSH -     Comprehensive metabolic panel with GFR -     VITAMIN D  25 Hydroxy (Vit-D Deficiency, Fractures)    No follow-ups on file.   Greig Ring, MD  "

## 2024-09-16 ENCOUNTER — Telehealth: Payer: Self-pay

## 2024-09-16 NOTE — Telephone Encounter (Signed)
 Received fax from [employer] stating pt was approved for PAP until 08/14/25. Letter has been scanned to media tab for retention.

## 2024-09-17 ENCOUNTER — Ambulatory Visit: Admitting: Cardiovascular Disease

## 2024-09-17 ENCOUNTER — Encounter: Payer: Self-pay | Admitting: Cardiovascular Disease

## 2024-09-17 ENCOUNTER — Encounter: Payer: Self-pay | Admitting: Pulmonary Disease

## 2024-09-17 ENCOUNTER — Ambulatory Visit: Admitting: Pulmonary Disease

## 2024-09-17 VITALS — BP 126/60 | HR 77 | Temp 98.0°F | Ht 72.0 in | Wt 210.0 lb

## 2024-09-17 VITALS — BP 120/58 | HR 84 | Ht 72.0 in | Wt 210.0 lb

## 2024-09-17 DIAGNOSIS — I739 Peripheral vascular disease, unspecified: Secondary | ICD-10-CM

## 2024-09-17 DIAGNOSIS — I10 Essential (primary) hypertension: Secondary | ICD-10-CM

## 2024-09-17 DIAGNOSIS — J4550 Severe persistent asthma, uncomplicated: Secondary | ICD-10-CM

## 2024-09-17 DIAGNOSIS — I429 Cardiomyopathy, unspecified: Secondary | ICD-10-CM

## 2024-09-17 DIAGNOSIS — E785 Hyperlipidemia, unspecified: Secondary | ICD-10-CM

## 2024-09-17 DIAGNOSIS — Z01811 Encounter for preprocedural respiratory examination: Secondary | ICD-10-CM | POA: Diagnosis not present

## 2024-09-17 DIAGNOSIS — Z01810 Encounter for preprocedural cardiovascular examination: Secondary | ICD-10-CM | POA: Diagnosis not present

## 2024-09-17 DIAGNOSIS — I2510 Atherosclerotic heart disease of native coronary artery without angina pectoris: Secondary | ICD-10-CM

## 2024-09-17 DIAGNOSIS — G4733 Obstructive sleep apnea (adult) (pediatric): Secondary | ICD-10-CM

## 2024-09-17 MED ORDER — BREZTRI AEROSPHERE 160-9-4.8 MCG/ACT IN AERO: 2.0000 | INHALATION_SPRAY | Freq: Two times a day (BID) | RESPIRATORY_TRACT | Status: AC

## 2024-09-17 NOTE — Patient Instructions (Signed)
 Medication Instructions:  No changes *If you need a refill on your cardiac medications before your next appointment, please call your pharmacy*  Lab Work: None ordered If you have labs (blood work) drawn today and your tests are completely normal, you will receive your results only by: MyChart Message (if you have MyChart) OR A paper copy in the mail If you have any lab test that is abnormal or we need to change your treatment, we will call you to review the results.  Testing/Procedures: None ordered  Follow-Up: At [facility], you and your health needs are our priority.  As part of our continuing mission to provide you with exceptional heart care, our providers are all part of one team.  This team includes your primary Cardiologist (physician) and Advanced Practice Providers or APPs (Physician Assistants and Nurse Practitioners) who all work together to provide you with the care you need, when you need it.  Your next appointment:   6 month(s)  Provider:   You may see Dr. Alvenia Aus or one of the following Advanced Practice Providers on your designated Care Team:   Laneta Pintos, NP Gildardo Labrador, PA-C Varney Gentleman, PA-C Cadence [place], PA-C Ronald Cockayne, NP Morey Ar, NP    We recommend signing up for the patient portal called MyChart.  Sign up information is provided on this After Visit Summary.  MyChart is used to connect with patients for Virtual Visits (Telemedicine).  Patients are able to view lab/test results, encounter notes, upcoming appointments, etc.  Non-urgent messages can be sent to your provider as well.   To learn more about what you can do with MyChart, go to [email or web address].

## 2024-09-19 ENCOUNTER — Other Ambulatory Visit: Payer: Self-pay

## 2024-09-19 NOTE — Progress Notes (Unsigned)
 [facility] OFFICE PROGRESS NOTE  Patient Care Team: Rilla Baller, MD as PCP - General (Family Medicine) Perla Evalene PARAS, MD as Consulting Physician (Cardiology) Tamea Dedra CROME, MD as Consulting Physician (Pulmonary Disease) Darron Deatrice LABOR, MD as Consulting Physician (Cardiology) Pa, [facility] (Ophthalmology) Tina Pauletta BROCKS, MD as Consulting Physician (Oncology) Twylla Glendia BROCKS, MD (Urology)  [name] is a 84 y.o.male with history of DVT, CKD 3, CAD with MI, CABG, emphysema, hypertension, hyperlipidemia, type 2 diabetes, carotid artery disease, BPH, DDD [facility] unresponsive NMIBC. Most recent pathology showed CIS.    Patient started intravesical gemcitabine  with docetaxel  without issue.  Induction: 8/1 to 04/19/24 weekly x 6 Maintenance: 05/17/2024   Last cystoscopy in December without any recurrence.  Next cysto on 3/16.  Assessment & Plan Urothelial carcinoma of bladder (HCC) Continue maintenance gem/doce Sodium bicarb 1300 mg the night before and the morning of treatment Pyridium  200 mg as needed 3 times a day on day 1 and 2 if having urinary pain Fridays works well. Follow up with Dr. Twylla in March as planned for cystoscopy Return in March for labs, follow-up with treatment   Pauletta BROCKS Tina, MD  INTERVAL HISTORY: [name] returns for treatment follow-up He tolerated last treatment well. Recovered from flu. No urinary pain, burning, hematuria. No pelvic pain.  Oncology History  Urothelial carcinoma of bladder (HCC)  10/19/2021 Pathology Results   DIAGNOSIS:  A. URINARY BLADDER TUMOR; TRANSURETHRAL RESECTION:  - NONINVASIVE PAPILLARY UROTHELIAL CARCINOMA, HIGH-GRADE (WHO/ISUP).  - MUSCULARIS PROPRIA PRESENT AND UNINVOLVED.   B. URINARY BLADDER, TUMOR BASE; TRANSURETHRAL RESECTION:  - BENIGN MUSCULARIS PROPRIA.  - NO DEFINITE INTACT UROTHELIUM IDENTIFIED.  - NEGATIVE FOR MALIGNANCY.   C. URINARY  BLADDER, LEFT POSTERIOR WALL; TRANSURETHRAL RESECTION:  - NONINVASIVE PAPILLARY UROTHELIAL CARCINOMA, LOW-GRADE (WHO/ISUP).  - NO MUSCULARIS PROPRIA IDENTIFIED.    11/12/2021 Initial Diagnosis   Urothelial carcinoma of bladder (HCC)   11/15/2022 Pathology Results   IAGNOSIS:  A. URINARY BLADDER, RIGHT LATERAL WALL; TRANSURETHRAL RESECTION:  - NONINVASIVE PAPILLARY UROTHELIAL CARCINOMA, HIGH-GRADE (WHO/ISUP).  - NO MUSCULARIS PROPRIA IDENTIFIED.   B. URINARY BLADDER TUMOR BASE, RIGHT LATERAL WALL; TRANSURETHRAL BIOPSY:  - PARTIALLY DENUDED, BENIGN APPEARING UROTHELIAL TISSUE.  - MUSCULARIS PROPRIA PRESENT AND UNINVOLVED.   C. URINARY BLADDER, LEFT LATERAL WALL; TRANSURETHRAL RESECTION:  - NONINVASIVE PAPILLARY UROTHELIAL CARCINOMA, LOW-GRADE (WHO/ISUP).  - NO MUSCULARIS PROPRIA IDENTIFIED.    01/16/2024 Pathology Results   1. 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,    03/15/2024 -  Chemotherapy   Patient is on Treatment Plan : BLADDER Gemcitabine  (1000), Docetaxel  (37.5) INTRAVESICAL q7d     03/15/2024 Cancer Staging   Staging form: Urinary Bladder, AJCC 8th Edition - Clinical: Stage 0is (cTis, cN0, cM0) - Signed by Tina Pauletta BROCKS, MD on 03/15/2024 WHO/ISUP grade (low/high): High Grade Histologic grading system: 2 grade system      PHYSICAL EXAMINATION: ECOG PERFORMANCE STATUS: 1 - Symptomatic but completely ambulatory  Vitals:   09/20/24 1054  BP: (!) 138/57  Pulse: 71  Resp: 17  Temp: (!) 97.5 F (36.4 C)  SpO2: 96%   Filed Weights   09/20/24 1054  Weight: 215 lb (97.5 kg)   No distress.  Relevant data reviewed during this visit included labs.

## 2024-09-19 NOTE — Assessment & Plan Note (Addendum)
 Continue maintenance gem/doce Sodium bicarb 1300 mg the night before and the morning of treatment Pyridium  200 mg as needed 3 times a day on day 1 and 2 if having urinary pain Fridays works well. Follow up with Dr. Twylla in March as planned for cystoscopy Return in March for labs, follow-up with treatment

## 2024-09-20 ENCOUNTER — Inpatient Hospital Stay

## 2024-09-20 ENCOUNTER — Encounter: Payer: Self-pay | Admitting: Pulmonary Disease

## 2024-09-20 VITALS — BP 138/57 | HR 71 | Temp 97.5°F | Resp 17 | Ht 72.0 in | Wt 215.0 lb

## 2024-09-20 VITALS — BP 129/50 | HR 70 | Temp 97.4°F | Resp 18

## 2024-09-20 DIAGNOSIS — C679 Malignant neoplasm of bladder, unspecified: Secondary | ICD-10-CM

## 2024-09-20 DIAGNOSIS — J4489 Other specified chronic obstructive pulmonary disease: Secondary | ICD-10-CM

## 2024-09-20 DIAGNOSIS — J4550 Severe persistent asthma, uncomplicated: Secondary | ICD-10-CM

## 2024-09-20 DIAGNOSIS — N1831 Chronic kidney disease, stage 3a: Secondary | ICD-10-CM

## 2024-09-20 LAB — CBC WITH DIFFERENTIAL (CANCER CENTER ONLY)
Abs Immature Granulocytes: 0.01 10*3/uL (ref 0.00–0.07)
Basophils Absolute: 0 10*3/uL (ref 0.0–0.1)
Basophils Relative: 1 %
Eosinophils Absolute: 0.2 10*3/uL (ref 0.0–0.5)
Eosinophils Relative: 3 %
HCT: 37.4 % — ABNORMAL LOW (ref 39.0–52.0)
Hemoglobin: 12.9 g/dL — ABNORMAL LOW (ref 13.0–17.0)
Immature Granulocytes: 0 %
Lymphocytes Relative: 20 %
Lymphs Abs: 1.1 10*3/uL (ref 0.7–4.0)
MCH: 32.2 pg (ref 26.0–34.0)
MCHC: 34.5 g/dL (ref 30.0–36.0)
MCV: 93.3 fL (ref 80.0–100.0)
Monocytes Absolute: 0.5 10*3/uL (ref 0.1–1.0)
Monocytes Relative: 10 %
Neutro Abs: 3.5 10*3/uL (ref 1.7–7.7)
Neutrophils Relative %: 66 %
Platelet Count: 151 10*3/uL (ref 150–400)
RBC: 4.01 MIL/uL — ABNORMAL LOW (ref 4.22–5.81)
RDW: 12.9 % (ref 11.5–15.5)
WBC Count: 5.3 10*3/uL (ref 4.0–10.5)
nRBC: 0 % (ref 0.0–0.2)

## 2024-09-20 LAB — CMP (CANCER CENTER ONLY)
ALT: 17 U/L (ref 0–44)
AST: 23 U/L (ref 15–41)
Albumin: 4.2 g/dL (ref 3.5–5.0)
Alkaline Phosphatase: 35 U/L — ABNORMAL LOW (ref 38–126)
Anion gap: 10 (ref 5–15)
BUN: 36 mg/dL — ABNORMAL HIGH (ref 8–23)
CO2: 25 mmol/L (ref 22–32)
Calcium: 9 mg/dL (ref 8.9–10.3)
Chloride: 104 mmol/L (ref 98–111)
Creatinine: 1.67 mg/dL — ABNORMAL HIGH (ref 0.61–1.24)
GFR, Estimated: 40 mL/min — ABNORMAL LOW
Glucose, Bld: 110 mg/dL — ABNORMAL HIGH (ref 70–99)
Potassium: 4.5 mmol/L (ref 3.5–5.1)
Sodium: 139 mmol/L (ref 135–145)
Total Bilirubin: 0.6 mg/dL (ref 0.0–1.2)
Total Protein: 6.3 g/dL — ABNORMAL LOW (ref 6.5–8.1)

## 2024-09-20 MED ORDER — OXYBUTYNIN CHLORIDE 5 MG PO TABS
5.0000 mg | ORAL_TABLET | Freq: Once | ORAL | Status: AC
  Administered 2024-09-20: 5 mg via ORAL
  Filled 2024-09-20: qty 1

## 2024-09-20 MED ORDER — SODIUM CHLORIDE (PF) 0.9 % IJ SOLN
1000.0000 mg | Freq: Once | INTRAVENOUS | Status: AC
  Administered 2024-09-20: 1000 mg via INTRAVESICAL
  Filled 2024-09-20: qty 26.3

## 2024-09-20 MED ORDER — FLUTICASONE-SALMETEROL 250-50 MCG/ACT IN AEPB: 1.0000 | INHALATION_SPRAY | Freq: Two times a day (BID) | RESPIRATORY_TRACT | 11 refills | Status: AC

## 2024-09-20 MED ORDER — PROCHLORPERAZINE MALEATE 10 MG PO TABS
10.0000 mg | ORAL_TABLET | Freq: Once | ORAL | Status: AC
  Administered 2024-09-20: 10 mg via ORAL
  Filled 2024-09-20: qty 1

## 2024-09-20 MED ORDER — SODIUM CHLORIDE 0.9 % IV SOLN: INTRAVENOUS | Status: DC

## 2024-09-20 MED ORDER — OXYBUTYNIN CHLORIDE 5 MG PO TABS
5.0000 mg | ORAL_TABLET | Freq: Once | ORAL | Status: AC | PRN
  Administered 2024-09-20: 5 mg via ORAL
  Filled 2024-09-20: qty 1

## 2024-09-20 MED ORDER — SODIUM CHLORIDE (PF) 0.9 % IJ SOLN
37.5000 mg | Freq: Once | INTRAVENOUS | Status: AC
  Administered 2024-09-20: 37.5 mg via INTRAVESICAL
  Filled 2024-09-20: qty 3.75

## 2024-09-20 NOTE — Progress Notes (Signed)
 Patient experienced 2 bladders spasms with minimal leakage around foley balloon after administration of gemzar . PRN oxybutynin  was given and spasms subsided. He tolerated the rest of his treatment without issue.

## 2024-09-20 NOTE — Telephone Encounter (Signed)
 Since he is on Dupixent  we can try switching to Advair/Wixela 250/50 1 puff twice a day.  He can let us  know if this does not keep his symptoms controlled and then we can try alternatives.

## 2024-09-26 ENCOUNTER — Ambulatory Visit: Admitting: Pulmonary Disease

## 2024-10-01 ENCOUNTER — Ambulatory Visit: Admitting: Family Medicine

## 2024-10-28 ENCOUNTER — Other Ambulatory Visit: Admitting: Urology

## 2024-11-15 ENCOUNTER — Ambulatory Visit: Admitting: Pulmonary Disease

## 2025-02-24 ENCOUNTER — Ambulatory Visit

## 2025-03-11 ENCOUNTER — Ambulatory Visit: Admitting: Dermatology
